# Patient Record
Sex: Male | Born: 1955 | Race: Black or African American | Hispanic: No | Marital: Married | Smoking: Former smoker
Health system: Southern US, Community
[De-identification: ages and names within clinical notes are randomized; demographics above are authoritative.]

## PROBLEM LIST (undated history)

## (undated) DIAGNOSIS — K439 Ventral hernia without obstruction or gangrene: Secondary | ICD-10-CM

## (undated) DIAGNOSIS — Z87442 Personal history of urinary calculi: Secondary | ICD-10-CM

## (undated) DIAGNOSIS — M1990 Unspecified osteoarthritis, unspecified site: Secondary | ICD-10-CM

## (undated) DIAGNOSIS — I10 Essential (primary) hypertension: Secondary | ICD-10-CM

## (undated) DIAGNOSIS — Z8701 Personal history of pneumonia (recurrent): Secondary | ICD-10-CM

## (undated) DIAGNOSIS — N529 Male erectile dysfunction, unspecified: Secondary | ICD-10-CM

## (undated) DIAGNOSIS — Z8679 Personal history of other diseases of the circulatory system: Secondary | ICD-10-CM

## (undated) DIAGNOSIS — I4892 Unspecified atrial flutter: Secondary | ICD-10-CM

## (undated) DIAGNOSIS — Z9289 Personal history of other medical treatment: Secondary | ICD-10-CM

## (undated) DIAGNOSIS — J189 Pneumonia, unspecified organism: Secondary | ICD-10-CM

## (undated) DIAGNOSIS — Z992 Dependence on renal dialysis: Secondary | ICD-10-CM

## (undated) DIAGNOSIS — E119 Type 2 diabetes mellitus without complications: Secondary | ICD-10-CM

## (undated) DIAGNOSIS — N2581 Secondary hyperparathyroidism of renal origin: Secondary | ICD-10-CM

## (undated) DIAGNOSIS — N186 End stage renal disease: Secondary | ICD-10-CM

## (undated) DIAGNOSIS — M109 Gout, unspecified: Secondary | ICD-10-CM

## (undated) DIAGNOSIS — G43909 Migraine, unspecified, not intractable, without status migrainosus: Secondary | ICD-10-CM

## (undated) DIAGNOSIS — I219 Acute myocardial infarction, unspecified: Secondary | ICD-10-CM

## (undated) DIAGNOSIS — Z973 Presence of spectacles and contact lenses: Secondary | ICD-10-CM

## (undated) DIAGNOSIS — I2510 Atherosclerotic heart disease of native coronary artery without angina pectoris: Secondary | ICD-10-CM

## (undated) HISTORY — PX: CARDIAC CATHETERIZATION: SHX172

## (undated) HISTORY — PX: COLONOSCOPY: SHX174

## (undated) HISTORY — DX: Gout, unspecified: M10.9

## (undated) HISTORY — PX: APPENDECTOMY: SHX54

## (undated) HISTORY — DX: Secondary hyperparathyroidism of renal origin: N25.81

---
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
## 1995-07-29 HISTORY — PX: CORONARY ARTERY BYPASS GRAFT: SHX141

## 2007-07-29 HISTORY — PX: KIDNEY TRANSPLANT: SHX239

## 2011-09-04 DIAGNOSIS — Z79899 Other long term (current) drug therapy: Secondary | ICD-10-CM | POA: Diagnosis not present

## 2011-09-04 DIAGNOSIS — Z940 Kidney transplant status: Secondary | ICD-10-CM | POA: Diagnosis not present

## 2011-10-02 DIAGNOSIS — Z79899 Other long term (current) drug therapy: Secondary | ICD-10-CM | POA: Diagnosis not present

## 2011-10-02 DIAGNOSIS — I10 Essential (primary) hypertension: Secondary | ICD-10-CM | POA: Diagnosis not present

## 2011-10-02 DIAGNOSIS — Z5189 Encounter for other specified aftercare: Secondary | ICD-10-CM | POA: Diagnosis not present

## 2011-10-02 DIAGNOSIS — Z5181 Encounter for therapeutic drug level monitoring: Secondary | ICD-10-CM | POA: Diagnosis not present

## 2011-11-11 DIAGNOSIS — Z5181 Encounter for therapeutic drug level monitoring: Secondary | ICD-10-CM | POA: Diagnosis not present

## 2011-11-11 DIAGNOSIS — Z79899 Other long term (current) drug therapy: Secondary | ICD-10-CM | POA: Diagnosis not present

## 2011-11-11 DIAGNOSIS — I10 Essential (primary) hypertension: Secondary | ICD-10-CM | POA: Diagnosis not present

## 2011-11-28 DIAGNOSIS — H2510 Age-related nuclear cataract, unspecified eye: Secondary | ICD-10-CM | POA: Diagnosis not present

## 2012-01-01 DIAGNOSIS — I10 Essential (primary) hypertension: Secondary | ICD-10-CM | POA: Diagnosis not present

## 2012-01-01 DIAGNOSIS — Z79899 Other long term (current) drug therapy: Secondary | ICD-10-CM | POA: Diagnosis not present

## 2012-01-01 DIAGNOSIS — Z5181 Encounter for therapeutic drug level monitoring: Secondary | ICD-10-CM | POA: Diagnosis not present

## 2012-02-17 DIAGNOSIS — I10 Essential (primary) hypertension: Secondary | ICD-10-CM | POA: Diagnosis not present

## 2012-02-17 DIAGNOSIS — Z5181 Encounter for therapeutic drug level monitoring: Secondary | ICD-10-CM | POA: Diagnosis not present

## 2012-02-17 DIAGNOSIS — Z79899 Other long term (current) drug therapy: Secondary | ICD-10-CM | POA: Diagnosis not present

## 2012-03-23 DIAGNOSIS — I10 Essential (primary) hypertension: Secondary | ICD-10-CM | POA: Diagnosis not present

## 2012-03-23 DIAGNOSIS — Z5181 Encounter for therapeutic drug level monitoring: Secondary | ICD-10-CM | POA: Diagnosis not present

## 2012-03-23 DIAGNOSIS — Z79899 Other long term (current) drug therapy: Secondary | ICD-10-CM | POA: Diagnosis not present

## 2012-05-27 DIAGNOSIS — I10 Essential (primary) hypertension: Secondary | ICD-10-CM | POA: Diagnosis not present

## 2012-05-27 DIAGNOSIS — Z5181 Encounter for therapeutic drug level monitoring: Secondary | ICD-10-CM | POA: Diagnosis not present

## 2012-05-27 DIAGNOSIS — Z79899 Other long term (current) drug therapy: Secondary | ICD-10-CM | POA: Diagnosis not present

## 2012-07-16 DIAGNOSIS — Z5181 Encounter for therapeutic drug level monitoring: Secondary | ICD-10-CM | POA: Diagnosis not present

## 2012-07-16 DIAGNOSIS — Z79899 Other long term (current) drug therapy: Secondary | ICD-10-CM | POA: Diagnosis not present

## 2012-07-16 DIAGNOSIS — I10 Essential (primary) hypertension: Secondary | ICD-10-CM | POA: Diagnosis not present

## 2012-07-27 DIAGNOSIS — Z23 Encounter for immunization: Secondary | ICD-10-CM | POA: Diagnosis not present

## 2012-08-11 DIAGNOSIS — Z5181 Encounter for therapeutic drug level monitoring: Secondary | ICD-10-CM | POA: Diagnosis not present

## 2012-08-11 DIAGNOSIS — Z79899 Other long term (current) drug therapy: Secondary | ICD-10-CM | POA: Diagnosis not present

## 2012-08-11 DIAGNOSIS — I10 Essential (primary) hypertension: Secondary | ICD-10-CM | POA: Diagnosis not present

## 2012-09-02 DIAGNOSIS — Z5181 Encounter for therapeutic drug level monitoring: Secondary | ICD-10-CM | POA: Diagnosis not present

## 2012-09-02 DIAGNOSIS — I10 Essential (primary) hypertension: Secondary | ICD-10-CM | POA: Diagnosis not present

## 2012-09-02 DIAGNOSIS — Z79899 Other long term (current) drug therapy: Secondary | ICD-10-CM | POA: Diagnosis not present

## 2012-10-07 DIAGNOSIS — I10 Essential (primary) hypertension: Secondary | ICD-10-CM | POA: Diagnosis not present

## 2012-10-07 DIAGNOSIS — Z940 Kidney transplant status: Secondary | ICD-10-CM | POA: Diagnosis not present

## 2012-10-07 DIAGNOSIS — N186 End stage renal disease: Secondary | ICD-10-CM | POA: Diagnosis not present

## 2012-10-21 DIAGNOSIS — T451X4A Poisoning by antineoplastic and immunosuppressive drugs, undetermined, initial encounter: Secondary | ICD-10-CM | POA: Diagnosis not present

## 2012-10-21 DIAGNOSIS — Z940 Kidney transplant status: Secondary | ICD-10-CM | POA: Diagnosis not present

## 2012-12-17 DIAGNOSIS — T451X4A Poisoning by antineoplastic and immunosuppressive drugs, undetermined, initial encounter: Secondary | ICD-10-CM | POA: Diagnosis not present

## 2012-12-17 DIAGNOSIS — Z940 Kidney transplant status: Secondary | ICD-10-CM | POA: Diagnosis not present

## 2013-01-27 DIAGNOSIS — H2510 Age-related nuclear cataract, unspecified eye: Secondary | ICD-10-CM | POA: Diagnosis not present

## 2013-01-27 DIAGNOSIS — H43819 Vitreous degeneration, unspecified eye: Secondary | ICD-10-CM | POA: Diagnosis not present

## 2013-01-31 DIAGNOSIS — Z940 Kidney transplant status: Secondary | ICD-10-CM | POA: Diagnosis not present

## 2013-01-31 DIAGNOSIS — R350 Frequency of micturition: Secondary | ICD-10-CM | POA: Diagnosis not present

## 2013-01-31 DIAGNOSIS — N19 Unspecified kidney failure: Secondary | ICD-10-CM | POA: Diagnosis not present

## 2013-01-31 DIAGNOSIS — E7800 Pure hypercholesterolemia, unspecified: Secondary | ICD-10-CM | POA: Diagnosis not present

## 2013-01-31 DIAGNOSIS — F17200 Nicotine dependence, unspecified, uncomplicated: Secondary | ICD-10-CM | POA: Diagnosis present

## 2013-01-31 DIAGNOSIS — E1010 Type 1 diabetes mellitus with ketoacidosis without coma: Secondary | ICD-10-CM | POA: Diagnosis not present

## 2013-01-31 DIAGNOSIS — I1310 Hypertensive heart and chronic kidney disease without heart failure, with stage 1 through stage 4 chronic kidney disease, or unspecified chronic kidney disease: Secondary | ICD-10-CM | POA: Diagnosis present

## 2013-01-31 DIAGNOSIS — E870 Hyperosmolality and hypernatremia: Secondary | ICD-10-CM | POA: Diagnosis not present

## 2013-01-31 DIAGNOSIS — R069 Unspecified abnormalities of breathing: Secondary | ICD-10-CM | POA: Diagnosis not present

## 2013-01-31 DIAGNOSIS — E1065 Type 1 diabetes mellitus with hyperglycemia: Secondary | ICD-10-CM | POA: Diagnosis not present

## 2013-01-31 DIAGNOSIS — E875 Hyperkalemia: Secondary | ICD-10-CM | POA: Diagnosis not present

## 2013-01-31 DIAGNOSIS — E1110 Type 2 diabetes mellitus with ketoacidosis without coma: Secondary | ICD-10-CM | POA: Diagnosis not present

## 2013-01-31 DIAGNOSIS — N184 Chronic kidney disease, stage 4 (severe): Secondary | ICD-10-CM | POA: Diagnosis present

## 2013-01-31 DIAGNOSIS — E860 Dehydration: Secondary | ICD-10-CM | POA: Diagnosis not present

## 2013-01-31 DIAGNOSIS — R5381 Other malaise: Secondary | ICD-10-CM | POA: Diagnosis not present

## 2013-01-31 DIAGNOSIS — R0602 Shortness of breath: Secondary | ICD-10-CM | POA: Diagnosis not present

## 2013-01-31 DIAGNOSIS — I2510 Atherosclerotic heart disease of native coronary artery without angina pectoris: Secondary | ICD-10-CM | POA: Diagnosis present

## 2013-01-31 DIAGNOSIS — Z7982 Long term (current) use of aspirin: Secondary | ICD-10-CM | POA: Diagnosis not present

## 2013-01-31 DIAGNOSIS — N185 Chronic kidney disease, stage 5: Secondary | ICD-10-CM | POA: Diagnosis not present

## 2013-02-08 DIAGNOSIS — Z940 Kidney transplant status: Secondary | ICD-10-CM | POA: Diagnosis not present

## 2013-02-08 DIAGNOSIS — I10 Essential (primary) hypertension: Secondary | ICD-10-CM | POA: Diagnosis not present

## 2013-02-08 DIAGNOSIS — E119 Type 2 diabetes mellitus without complications: Secondary | ICD-10-CM | POA: Diagnosis not present

## 2013-02-12 DIAGNOSIS — I10 Essential (primary) hypertension: Secondary | ICD-10-CM | POA: Diagnosis not present

## 2013-02-12 DIAGNOSIS — Z136 Encounter for screening for cardiovascular disorders: Secondary | ICD-10-CM | POA: Diagnosis not present

## 2013-02-12 DIAGNOSIS — E119 Type 2 diabetes mellitus without complications: Secondary | ICD-10-CM | POA: Diagnosis not present

## 2013-02-24 DIAGNOSIS — E559 Vitamin D deficiency, unspecified: Secondary | ICD-10-CM | POA: Diagnosis not present

## 2013-02-24 DIAGNOSIS — R69 Illness, unspecified: Secondary | ICD-10-CM | POA: Diagnosis not present

## 2013-02-24 DIAGNOSIS — E119 Type 2 diabetes mellitus without complications: Secondary | ICD-10-CM | POA: Diagnosis not present

## 2013-03-02 DIAGNOSIS — Z940 Kidney transplant status: Secondary | ICD-10-CM | POA: Diagnosis not present

## 2013-03-02 DIAGNOSIS — T451X4A Poisoning by antineoplastic and immunosuppressive drugs, undetermined, initial encounter: Secondary | ICD-10-CM | POA: Diagnosis not present

## 2013-03-07 DIAGNOSIS — E119 Type 2 diabetes mellitus without complications: Secondary | ICD-10-CM | POA: Diagnosis not present

## 2013-03-07 DIAGNOSIS — H524 Presbyopia: Secondary | ICD-10-CM | POA: Diagnosis not present

## 2013-03-07 DIAGNOSIS — H5210 Myopia, unspecified eye: Secondary | ICD-10-CM | POA: Diagnosis not present

## 2013-03-07 DIAGNOSIS — E109 Type 1 diabetes mellitus without complications: Secondary | ICD-10-CM | POA: Diagnosis not present

## 2013-03-08 ENCOUNTER — Encounter: Payer: Medicare Other | Attending: Emergency Medicine | Admitting: *Deleted

## 2013-03-08 VITALS — Ht 67.0 in | Wt 225.3 lb

## 2013-03-08 DIAGNOSIS — Z713 Dietary counseling and surveillance: Secondary | ICD-10-CM | POA: Diagnosis not present

## 2013-03-08 DIAGNOSIS — E119 Type 2 diabetes mellitus without complications: Secondary | ICD-10-CM | POA: Diagnosis not present

## 2013-03-12 DIAGNOSIS — J019 Acute sinusitis, unspecified: Secondary | ICD-10-CM | POA: Diagnosis not present

## 2013-03-12 DIAGNOSIS — E119 Type 2 diabetes mellitus without complications: Secondary | ICD-10-CM | POA: Diagnosis not present

## 2013-03-16 ENCOUNTER — Encounter: Payer: Self-pay | Admitting: *Deleted

## 2013-03-16 NOTE — Patient Instructions (Signed)
Goals:  Follow Diabetes Meal Plan as instructed  Eat 3 meals and 2 snacks, every 3-5 hrs  Limit carbohydrate intake to 45-60 grams carbohydrate/meal  Limit carbohydrate intake to 0-30 grams carbohydrate/snack  Add lean protein foods to meals/snacks  Monitor glucose levels as instructed by your doctor  Aim for 15-30 mins of physical activity daily  Bring food record and glucose log to your next nutrition visit

## 2013-03-16 NOTE — Progress Notes (Signed)
Patient was seen on 03/08/13 for the first of a series of three diabetes self-management courses at the [facility].   Current HbA1c: not provided by MD  The following learning objectives were met by the patient during this course:   Defines the role of glucose and insulin  Identifies type of diabetes and pathophysiology  Defines the diagnostic criteria for diabetes and prediabetes  States the risk factors for Type 2 Diabetes  States the symptoms of Type 2 Diabetes  Defines Type 2 Diabetes treatment goals  Defines Type 2 Diabetes treatment options  States the rationale for glucose monitoring  Identifies A1C, glucose targets, and testing times  Identifies proper sharps disposal  Defines the purpose of a diabetes food plan  Identifies carbohydrate food groups  Defines effects of carbohydrate foods on glucose levels  Identifies carbohydrate choices/grams/food labels  States benefits of physical activity and effect on glucose  Review of suggested activity guidelines  Handouts given during class include:  Type 2 Diabetes: Basics Book  My [place] and Activity Log  Your patient has identified their diabetes self-care support plan as:  [place] support group  Continued diabetes education  Follow-Up Plan: Attend core 2 and core 3

## 2013-03-18 ENCOUNTER — Ambulatory Visit (INDEPENDENT_AMBULATORY_CARE_PROVIDER_SITE_OTHER): Payer: Medicare Other | Admitting: Endocrinology

## 2013-03-18 VITALS — BP 132/80 | HR 73 | Ht 67.0 in | Wt 227.0 lb

## 2013-03-18 DIAGNOSIS — N529 Male erectile dysfunction, unspecified: Secondary | ICD-10-CM | POA: Diagnosis not present

## 2013-03-18 DIAGNOSIS — M109 Gout, unspecified: Secondary | ICD-10-CM

## 2013-03-18 DIAGNOSIS — E109 Type 1 diabetes mellitus without complications: Secondary | ICD-10-CM

## 2013-03-18 DIAGNOSIS — N186 End stage renal disease: Secondary | ICD-10-CM

## 2013-03-18 DIAGNOSIS — I10 Essential (primary) hypertension: Secondary | ICD-10-CM

## 2013-03-18 DIAGNOSIS — N2581 Secondary hyperparathyroidism of renal origin: Secondary | ICD-10-CM

## 2013-03-18 DIAGNOSIS — Z940 Kidney transplant status: Secondary | ICD-10-CM

## 2013-03-18 NOTE — Progress Notes (Signed)
Subjective:    Patient ID: Patrick [name], male    DOB: June 19, 1956, 57 y.o.   MRN: [id number]  HPI He had an episode of severe hyperglycemia last month, and was then dx'ed with DM.  He denies numbness of the feet, or assoc pain.  He says his diet and activity are good.  he brings a record of his cbg's which i have reviewed today.  He is having hypoglycemia in the early am.  During the day, it goes as high as 184.  Prednisone dosage has not recently changed.   Past Medical History  Diagnosis Date  . Hypertension   . Diabetes mellitus without complication     Past Surgical History  Procedure Laterality Date  . Birth defect heart surgery  1996  . Kidney transplant  2009    History   Social History  . Marital Status: Married    Spouse Name: N/A    Number of Children: N/A  . Years of Education: N/A   Occupational History  . Not on file.   Social History Main Topics  . Smoking status: Not on file  . Smokeless tobacco: Not on file  . Alcohol Use: Not on file  . Drug Use: Not on file  . Sexual Activity: Not on file   Other Topics Concern  . Not on file   Social History Narrative  . No narrative on file    Current Outpatient Prescriptions on File Prior to Visit  Medication Sig Dispense Refill  . allopurinol (ZYLOPRIM) 100 MG tablet Take 100 mg by mouth daily.      Marland Kitchen amLODipine (NORVASC) 10 MG tablet Take 10 mg by mouth daily.      Marland Kitchen amLODipine (NORVASC) 2.5 MG tablet Take 2.5 mg by mouth daily.      . calcitRIOL (ROCALTROL) 0.5 MCG capsule Take 0.5 mcg by mouth daily.      . carvedilol (COREG) 25 MG tablet Take 25 mg by mouth 2 (two) times daily with a meal.      . Insulin Detemir (LEVEMIR FLEXPEN [place]) Inject 58 Units into the skin at bedtime.      . Insulin Lispro, Human, (HUMALOG PEN [place]) Inject into the skin 3 (three) times daily before meals.      . predniSONE (DELTASONE) 5 MG tablet Take 5 mg by mouth daily.      . tadalafil (CIALIS) 5 MG tablet Take 5 mg by mouth daily  as needed for erectile dysfunction.       No current facility-administered medications on file prior to visit.    No Known Allergies  No family history on file. DM: none BP 132/80  Pulse 73  Ht 5\' 7"  (1.702 m)  Wt 227 lb (102.967 kg)  BMI 35.55 kg/m2  SpO2 98%  Review of Systems denies blurry vision, headache, chest pain, sob, n/v, urinary frequency, cramps, excessive diaphoresis, memory loss, depression, rhinorrhea, and easy bruising.  He has lost weight.      Objective:   Physical Exam VS: see vs page GEN: no distress HEAD: head: no deformity eyes: no periorbital swelling, no proptosis external nose and ears are normal mouth: no lesion seen NECK: supple, thyroid is not enlarged CHEST WALL: no deformity LUNGS:  Clear to auscultation CV: reg rate and rhythm, no murmur ABD: abdomen is soft, nontender.  no hepatosplenomegaly.  not distended.  no hernia.  Large RLQ incisional hernia.  Old healed surgical scars are noted.   MUSCULOSKELETAL: muscle bulk and strength are  grossly normal.  no obvious joint swelling.  gait is normal and steady PULSES: no carotid bruit NEURO:  cn 2-12 grossly intact.   readily moves all 4's.   SKIN:  Normal texture and temperature.  No rash or suspicious lesion is visible.   NODES:  None palpable at the neck PSYCH: alert, oriented x3.  Does not appear anxious nor depressed.   outside test results are reviewed: A1c=13%    Assessment & Plan:  DM: this a1c causes very high risk to his health.  The pattern of his cbg's indicates he needs some adjustment in his therapy. ESRD with transplant.  The prednisone would not contribute significantly to his hyperglycemia. Weight loss.  This helps the control of his DM.

## 2013-03-18 NOTE — Patient Instructions (Addendum)
good diet and exercise habits significanly improve the control of your diabetes.  please let me know if you wish to be referred to a dietician.  high blood sugar is very risky to your health.  you should see an eye doctor every year.  You are at higher than average risk for pneumonia and hepatitis-B.  You should be vaccinated against both.   controlling your blood pressure and cholesterol drastically reduces the damage diabetes does to your body.  this also applies to quitting smoking.  please discuss these with your doctor.  check your blood sugar twice a day.  vary the time of day when you check, between before the 3 meals, and at bedtime.  also check if you have symptoms of your blood sugar being too high or too low.  please keep a record of the readings and bring it to your next appointment here.  please call us sooner if your blood sugar goes below 70, or if you have a lot of readings over 200.   Please reduce the levemir to 30 units at bedtime, and: Take humalog 15 units 3 times a day (just before each meal), no matter what your blood sugar is.   Please come back for a follow-up appointment in 2 weeks.

## 2013-03-19 DIAGNOSIS — I10 Essential (primary) hypertension: Secondary | ICD-10-CM | POA: Insufficient documentation

## 2013-03-19 DIAGNOSIS — N529 Male erectile dysfunction, unspecified: Secondary | ICD-10-CM | POA: Insufficient documentation

## 2013-03-19 DIAGNOSIS — M109 Gout, unspecified: Secondary | ICD-10-CM | POA: Insufficient documentation

## 2013-03-19 DIAGNOSIS — N2581 Secondary hyperparathyroidism of renal origin: Secondary | ICD-10-CM | POA: Insufficient documentation

## 2013-03-19 DIAGNOSIS — N186 End stage renal disease: Secondary | ICD-10-CM | POA: Insufficient documentation

## 2013-03-29 ENCOUNTER — Ambulatory Visit: Payer: Self-pay | Admitting: Internal Medicine

## 2013-03-29 ENCOUNTER — Ambulatory Visit: Payer: Self-pay

## 2013-04-01 ENCOUNTER — Ambulatory Visit (INDEPENDENT_AMBULATORY_CARE_PROVIDER_SITE_OTHER): Payer: Medicare Other | Admitting: Endocrinology

## 2013-04-01 VITALS — BP 134/74 | Ht 65.0 in | Wt 228.0 lb

## 2013-04-01 DIAGNOSIS — E109 Type 1 diabetes mellitus without complications: Secondary | ICD-10-CM

## 2013-04-01 NOTE — Patient Instructions (Addendum)
check your blood sugar twice a day.  vary the time of day when you check, between before the 3 meals, and at bedtime.  also check if you have symptoms of your blood sugar being too high or too low.  please keep a record of the readings and bring it to your next appointment here.  please call us sooner if your blood sugar goes below 70, or if you have a lot of readings over 200.   Please continue the same levemir, and:   Change the humalog to 3 times a day (just before each meal) 05-11-14 units.   Please come back for a follow-up appointment in 1 month.    In my opinion, you can resume driving.

## 2013-04-01 NOTE — Progress Notes (Signed)
  Subjective:    Patient ID: Patrick [name], male    DOB: 03-12-1956, 57 y.o.   MRN: [id number]  HPI He had an episode of severe hyperglycemia 2 months ago, and was then dx'ed with DM.  He denies numbness of the feet, or assoc pain.  he brings a record of his cbg's which i have reviewed today, checked qac.  It varies from 60 (before lunch) to low-100's (other times of day).  He does not check at hs. pt states he feels well in general.   Past Medical History  Diagnosis Date  . Hypertension   . Diabetes mellitus without complication     Past Surgical History  Procedure Laterality Date  . Birth defect heart surgery  1996  . Kidney transplant  2009    History   Social History  . Marital Status: Married    Spouse Name: N/A    Number of Children: N/A  . Years of Education: N/A   Occupational History  . Not on file.   Social History Main Topics  . Smoking status: Not on file  . Smokeless tobacco: Not on file  . Alcohol Use: Not on file  . Drug Use: Not on file  . Sexual Activity: Not on file   Other Topics Concern  . Not on file   Social History Narrative  . No narrative on file    Current Outpatient Prescriptions on File Prior to Visit  Medication Sig Dispense Refill  . allopurinol (ZYLOPRIM) 100 MG tablet Take 100 mg by mouth daily.      Marland Kitchen amLODipine (NORVASC) 10 MG tablet Take 10 mg by mouth daily.      Marland Kitchen amLODipine (NORVASC) 2.5 MG tablet Take 2.5 mg by mouth daily.      . calcitRIOL (ROCALTROL) 0.5 MCG capsule Take 0.5 mcg by mouth daily.      . carvedilol (COREG) 25 MG tablet Take 25 mg by mouth 2 (two) times daily with a meal.      . Insulin Detemir (LEVEMIR FLEXPEN [place]) Inject 30 Units into the skin at bedtime.       . Insulin Lispro, Human, (HUMALOG PEN [place]) 3 times a day (just before each meal) 05-11-14 units      . predniSONE (DELTASONE) 5 MG tablet Take 5 mg by mouth daily.      . tadalafil (CIALIS) 5 MG tablet Take 5 mg by mouth daily as needed for erectile  dysfunction.       No current facility-administered medications on file prior to visit.   No Known Allergies  No family history on file.  BP 134/74  Ht 5\' 5"  (1.651 m)  Wt 228 lb (103.42 kg)  BMI 37.94 kg/m2  SpO2 97%  Review of Systems Denies LOC and weight change.      Objective:   Physical Exam VITAL SIGNS:  See vs page GENERAL: no distress SKIN:  Insulin injection sites at the anterior abdomen are normal.       Assessment & Plan:  DM: This insulin regimen was chosen from multiple options, as it best matches his insulin to his changing requirements throughout the day.  The benefits of glycemic control must be weighed against the risks of hypoglycemia.   Renal failure: this sometimes removes the need for basal insulin, but she has had a transplant.

## 2013-04-12 ENCOUNTER — Ambulatory Visit: Payer: Self-pay

## 2013-04-14 DIAGNOSIS — Z940 Kidney transplant status: Secondary | ICD-10-CM | POA: Diagnosis not present

## 2013-04-14 DIAGNOSIS — T451X4A Poisoning by antineoplastic and immunosuppressive drugs, undetermined, initial encounter: Secondary | ICD-10-CM | POA: Diagnosis not present

## 2013-05-06 ENCOUNTER — Ambulatory Visit (INDEPENDENT_AMBULATORY_CARE_PROVIDER_SITE_OTHER): Payer: Medicare Other | Admitting: Endocrinology

## 2013-05-06 VITALS — BP 126/80 | HR 80 | Wt 227.0 lb

## 2013-05-06 DIAGNOSIS — E109 Type 1 diabetes mellitus without complications: Secondary | ICD-10-CM

## 2013-05-06 NOTE — Progress Notes (Signed)
  Subjective:    Patient ID: Patrick [name], male    DOB: 05/01/1956, 56 y.o.   MRN: [id number]  HPI Pt returns for f/u of insulin-requiring DM (dx'ed mid-2014, when he presented with severe hyperglycemia; he has mild neuropathy of the lower extremities; no known associated chronic complications).  He has numbness of the feet.  no cbg record, but states cbg's vary from 98-170.  It is in general higher as the day goes on, but he does not check at hs.   Past Medical History  Diagnosis Date  . Hypertension   . Diabetes mellitus without complication     Past Surgical History  Procedure Laterality Date  . Birth defect heart surgery  1996  . Kidney transplant  2009    History   Social History  . Marital Status: Married    Spouse Name: N/A    Number of Children: N/A  . Years of Education: N/A   Occupational History  . Not on file.   Social History Main Topics  . Smoking status: Not on file  . Smokeless tobacco: Not on file  . Alcohol Use: Not on file  . Drug Use: Not on file  . Sexual Activity: Not on file   Other Topics Concern  . Not on file   Social History Narrative  . No narrative on file    Current Outpatient Prescriptions on File Prior to Visit  Medication Sig Dispense Refill  . allopurinol (ZYLOPRIM) 100 MG tablet Take 100 mg by mouth daily.      Marland Kitchen amLODipine (NORVASC) 10 MG tablet Take 10 mg by mouth daily.      Marland Kitchen amLODipine (NORVASC) 2.5 MG tablet Take 2.5 mg by mouth daily.      . calcitRIOL (ROCALTROL) 0.5 MCG capsule Take 0.5 mcg by mouth daily.      . carvedilol (COREG) 25 MG tablet Take 25 mg by mouth 2 (two) times daily with a meal.      . Insulin Detemir (LEVEMIR FLEXPEN [place]) Inject 20 Units into the skin at bedtime.       . Insulin Lispro, Human, (HUMALOG PEN [place]) 3 times a day (just before each meal) 05-17-19 units      . predniSONE (DELTASONE) 5 MG tablet Take 5 mg by mouth daily.      . tadalafil (CIALIS) 5 MG tablet Take 5 mg by mouth daily as needed for  erectile dysfunction.       No current facility-administered medications on file prior to visit.    No Known Allergies  No family history on file.  BP 126/80  Pulse 80  Wt 227 lb (102.967 kg)  BMI 37.77 kg/m2  SpO2 95%  Review of Systems denies hypoglycemia and weight change.      Objective:   Physical Exam VITAL SIGNS:  See vs page GENERAL: no distress.    Assessment & Plan:  DM: This insulin regimen was chosen from multiple options, as it best matches his insulin to his changing requirements throughout the day.  The benefits of glycemic control must be weighed against the risks of hypoglycemia.  Renal failure: prednisone can worsen glycemic control.   Numbness, usually due to DM. We can check b-12 if persists.

## 2013-05-06 NOTE — Patient Instructions (Addendum)
check your blood sugar twice a day.  vary the time of day when you check, between before the 3 meals, and at bedtime.  also check if you have symptoms of your blood sugar being too high or too low.  please keep a record of the readings and bring it to your next appointment here.  please call us sooner if your blood sugar goes below 70, or if you have a lot of readings over 200.   Please reduce the levemir to 20 units at bedtime, and:   Change the humalog to 3 times a day (just before each meal) 05-17-19 units.   Please come back for a follow-up appointment in 2 months.

## 2013-05-10 ENCOUNTER — Encounter: Payer: Medicare Other | Attending: Emergency Medicine

## 2013-05-10 DIAGNOSIS — E119 Type 2 diabetes mellitus without complications: Secondary | ICD-10-CM | POA: Insufficient documentation

## 2013-05-10 DIAGNOSIS — Z713 Dietary counseling and surveillance: Secondary | ICD-10-CM | POA: Insufficient documentation

## 2013-05-25 DIAGNOSIS — Z713 Dietary counseling and surveillance: Secondary | ICD-10-CM | POA: Diagnosis not present

## 2013-05-25 DIAGNOSIS — E119 Type 2 diabetes mellitus without complications: Secondary | ICD-10-CM | POA: Diagnosis not present

## 2013-05-25 NOTE — Progress Notes (Signed)
Patient was seen on 05/25/13 for the third of a series of three diabetes self-management courses at the [facility]. The following learning objectives were met by the patient during this class:    State the amount of activity recommended for healthy living   Describe activities suitable for individual needs   Identify ways to regularly incorporate activity into daily life   Identify barriers to activity and ways to over come these barriers  Identify diabetes medications being personally used and their primary action for lowering glucose and possible side effects   Describe role of stress on blood glucose and develop strategies to address psychosocial issues   Identify diabetes complications and ways to prevent them  Explain how to manage diabetes during illness   Evaluate success in meeting personal goal   Establish 2-3 goals that they will plan to diligently work on until they return for the free 32-month follow-up visit  Your patient has established the following 4 month goals in their individualized success plan:  none  Your patient has identified these potential barriers to change:  None  Your patient has identified their diabetes self-care support plan as  None

## 2013-05-26 ENCOUNTER — Telehealth: Payer: Self-pay | Admitting: Endocrinology

## 2013-05-26 ENCOUNTER — Other Ambulatory Visit: Payer: Self-pay | Admitting: Endocrinology

## 2013-05-26 NOTE — Telephone Encounter (Signed)
Next option is humulin NPH kwikpen. If that is too expensive, too, the next option is to take from the bottle.

## 2013-05-26 NOTE — Telephone Encounter (Signed)
Patient called and states that the drug store said they are not doing the flex pen for levemir any longer and it is going to cost 145.00 and he wants to know if there is anything else he can take that will cost less. Please advise.

## 2013-05-26 NOTE — Telephone Encounter (Signed)
They are changing to "flextouch."  Please inquire about the cost of this.  If it too is very expensive, please call back

## 2013-05-26 NOTE — Telephone Encounter (Signed)
Called pt and advised him that the next option was Humulin NPH kwikpen. He stated that he would just stay on the Levemir. Told him ok.

## 2013-05-26 NOTE — Telephone Encounter (Signed)
Called pt and advised him they changed it to [place]. Pt said that's what they told him. That is too expensive and he would like something less expensive. Please advise.

## 2013-05-30 ENCOUNTER — Other Ambulatory Visit: Payer: Self-pay | Admitting: Endocrinology

## 2013-05-30 DIAGNOSIS — Z940 Kidney transplant status: Secondary | ICD-10-CM | POA: Diagnosis not present

## 2013-05-30 DIAGNOSIS — T451X4A Poisoning by antineoplastic and immunosuppressive drugs, undetermined, initial encounter: Secondary | ICD-10-CM | POA: Diagnosis not present

## 2013-06-30 DIAGNOSIS — Z940 Kidney transplant status: Secondary | ICD-10-CM | POA: Diagnosis not present

## 2013-06-30 DIAGNOSIS — T451X4A Poisoning by antineoplastic and immunosuppressive drugs, undetermined, initial encounter: Secondary | ICD-10-CM | POA: Diagnosis not present

## 2013-07-07 DIAGNOSIS — N529 Male erectile dysfunction, unspecified: Secondary | ICD-10-CM | POA: Diagnosis not present

## 2013-07-07 DIAGNOSIS — Z940 Kidney transplant status: Secondary | ICD-10-CM | POA: Diagnosis not present

## 2013-07-07 DIAGNOSIS — I10 Essential (primary) hypertension: Secondary | ICD-10-CM | POA: Diagnosis not present

## 2013-07-07 DIAGNOSIS — D849 Immunodeficiency, unspecified: Secondary | ICD-10-CM | POA: Insufficient documentation

## 2013-07-07 DIAGNOSIS — D899 Disorder involving the immune mechanism, unspecified: Secondary | ICD-10-CM | POA: Diagnosis not present

## 2013-07-25 ENCOUNTER — Other Ambulatory Visit: Payer: Self-pay

## 2013-07-25 MED ORDER — INSULIN LISPRO 100 UNIT/ML (KWIKPEN): PEN_INJECTOR | SUBCUTANEOUS | Status: DC

## 2013-08-02 ENCOUNTER — Ambulatory Visit (INDEPENDENT_AMBULATORY_CARE_PROVIDER_SITE_OTHER): Payer: Medicare Other | Admitting: Endocrinology

## 2013-08-02 ENCOUNTER — Encounter: Payer: Self-pay | Admitting: Endocrinology

## 2013-08-02 VITALS — BP 126/60 | HR 72 | Temp 98.0°F | Ht 67.0 in | Wt 234.4 lb

## 2013-08-02 DIAGNOSIS — E109 Type 1 diabetes mellitus without complications: Secondary | ICD-10-CM

## 2013-08-02 LAB — HEMOGLOBIN A1C: Hgb A1c MFr Bld: 6.3 % (ref 4.6–6.5)

## 2013-08-02 MED ORDER — INSULIN NPH (HUMAN) (ISOPHANE) 100 UNIT/ML ~~LOC~~ SUSP: 15.0000 [IU] | Freq: Every day | SUBCUTANEOUS | Status: DC

## 2013-08-02 MED ORDER — INSULIN REGULAR HUMAN 100 UNIT/ML IJ SOLN: INTRAMUSCULAR | Status: DC

## 2013-08-02 NOTE — Patient Instructions (Addendum)
check your blood sugar twice a day.  vary the time of day when you check, between before the 3 meals, and at bedtime.  also check if you have symptoms of your blood sugar being too high or too low.  please keep a record of the readings and bring it to your next appointment here.  please call us sooner if your blood sugar goes below 70, or if you have a lot of readings over 200.   Please change the levemir NPH, 15 units at bedtime, and:   Change the humalog to regular, 3 times a day (just before each meal) 05-17-19 units.   Please come back for a follow-up appointment in 3 months.    blood tests are being requested for you today.  We'll contact you with results.

## 2013-08-02 NOTE — Progress Notes (Signed)
   Subjective:    Patient ID: Patrick [name], male    DOB: [birth year], 58 y.o.   MRN: [id number]  HPI Pt returns for f/u of insulin-requiring DM (dx'ed mid-2014, when he presented with severe hyperglycemia; he has mild neuropathy of the lower extremities, and associated renal failure (transplant)).   pt says he ran out of his insulin last week, as he could not afford it.  When he was on the insulin, cbg's were well-controlled.   Past Medical History  Diagnosis Date  . Hypertension   . Diabetes mellitus without complication     Past Surgical History  Procedure Laterality Date  . Birth defect heart surgery  1996  . Kidney transplant  2009    History   Social History  . Marital Status: Married    Spouse Name: N/A    Number of Children: N/A  . Years of Education: N/A   Occupational History  . Not on file.   Social History Main Topics  . Smoking status: Not on file  . Smokeless tobacco: Not on file  . Alcohol Use: No  . Drug Use: Not on file  . Sexual Activity: Not on file   Other Topics Concern  . Not on file   Social History Narrative  . No narrative on file    Current Outpatient Prescriptions on File Prior to Visit  Medication Sig Dispense Refill  . allopurinol (ZYLOPRIM) 100 MG tablet Take 100 mg by mouth daily.      Marland Kitchen amLODipine (NORVASC) 10 MG tablet Take 10 mg by mouth daily.      Marland Kitchen amLODipine (NORVASC) 2.5 MG tablet Take 2.5 mg by mouth daily.      . calcitRIOL (ROCALTROL) 0.5 MCG capsule Take 0.5 mcg by mouth daily.      . carvedilol (COREG) 25 MG tablet Take 25 mg by mouth 2 (two) times daily with a meal.      . predniSONE (DELTASONE) 5 MG tablet Take 5 mg by mouth daily.      . tadalafil (CIALIS) 5 MG tablet Take 5 mg by mouth daily as needed for erectile dysfunction.       No current facility-administered medications on file prior to visit.   No Known Allergies  No family history on file.  BP 126/60  Pulse 72  Temp(Src) 98 F (36.7 C) (Oral)  Ht 5'  7" (1.702 m)  Wt 234 lb 6 oz (106.312 kg)  BMI 36.70 kg/m2  SpO2 96%  Review of Systems denies hypoglycemia and weight change.      Objective:   Physical Exam VITAL SIGNS:  See vs page GENERAL: no distress  Lab Results  Component Value Date   HGBA1C 6.3 08/02/2013      Assessment & Plan:  DM: This insulin regimen was chosen from multiple options, as it best matches his insulin to his changing requirements throughout the day.  The benefits of glycemic control must be weighed against the risks of hypoglycemia.  Economic circumstances: these limit the rx of DM.

## 2013-08-09 ENCOUNTER — Other Ambulatory Visit: Payer: Self-pay | Admitting: Endocrinology

## 2013-08-12 ENCOUNTER — Ambulatory Visit: Payer: Medicare Other | Admitting: Endocrinology

## 2013-08-12 DIAGNOSIS — Z0289 Encounter for other administrative examinations: Secondary | ICD-10-CM

## 2013-08-17 DIAGNOSIS — Z940 Kidney transplant status: Secondary | ICD-10-CM | POA: Diagnosis not present

## 2013-08-17 DIAGNOSIS — T451X4A Poisoning by antineoplastic and immunosuppressive drugs, undetermined, initial encounter: Secondary | ICD-10-CM | POA: Diagnosis not present

## 2013-09-05 ENCOUNTER — Telehealth: Payer: Self-pay

## 2013-09-05 MED ORDER — GLUCOSE BLOOD VI STRP: ORAL_STRIP | Status: DC

## 2013-09-05 NOTE — Telephone Encounter (Signed)
Please decrease lunch insulin to 10 units

## 2013-09-05 NOTE — Telephone Encounter (Signed)
Pt called stating that between lunch and supper his sugars are dropping into the 70's. Pt is currently taking 5 units with breakfast, 15 units with lunch, and 15 units with supper. Also, pt reports feelings of nausea and fulness.  Please advise, Thanks!

## 2013-09-06 MED ORDER — INSULIN LISPRO 100 UNIT/ML (KWIKPEN): PEN_INJECTOR | SUBCUTANEOUS | Status: DC

## 2013-09-06 MED ORDER — INSULIN DETEMIR 100 UNIT/ML FLEXPEN: 20.0000 [IU] | PEN_INJECTOR | Freq: Every day | SUBCUTANEOUS | Status: DC

## 2013-09-06 NOTE — Telephone Encounter (Signed)
We went with the N and R insulin, because they are cheaper.  Are you now able to afford the more expensive ones?

## 2013-09-06 NOTE — Telephone Encounter (Signed)
Pt states he is out of the "doughnut hole" States that it will cost him 35$ for each insulin. Thanks!

## 2013-09-06 NOTE — Telephone Encounter (Signed)
Pt informed. Pt requesting to changed from Humulin N & R to Humalog and Levemir.  Please advise, Thanks!

## 2013-09-06 NOTE — Telephone Encounter (Signed)
Ok, i have sent prescriptions to your pharmacy

## 2013-09-07 NOTE — Telephone Encounter (Signed)
Pt informed

## 2013-09-09 DIAGNOSIS — J018 Other acute sinusitis: Secondary | ICD-10-CM | POA: Diagnosis not present

## 2013-09-26 DIAGNOSIS — E162 Hypoglycemia, unspecified: Secondary | ICD-10-CM | POA: Diagnosis not present

## 2013-09-26 DIAGNOSIS — E161 Other hypoglycemia: Secondary | ICD-10-CM | POA: Diagnosis not present

## 2013-09-26 DIAGNOSIS — E119 Type 2 diabetes mellitus without complications: Secondary | ICD-10-CM | POA: Diagnosis not present

## 2013-09-26 DIAGNOSIS — R112 Nausea with vomiting, unspecified: Secondary | ICD-10-CM | POA: Diagnosis not present

## 2013-09-26 DIAGNOSIS — Z794 Long term (current) use of insulin: Secondary | ICD-10-CM | POA: Diagnosis not present

## 2013-09-27 ENCOUNTER — Ambulatory Visit: Payer: Medicare Other | Admitting: *Deleted

## 2013-09-28 ENCOUNTER — Telehealth: Payer: Self-pay | Admitting: Endocrinology

## 2013-09-28 ENCOUNTER — Emergency Department (HOSPITAL_COMMUNITY)
Admission: EM | Admit: 2013-09-28 | Discharge: 2013-09-28 | Payer: Medicare Other | Attending: Emergency Medicine | Admitting: Emergency Medicine

## 2013-09-28 ENCOUNTER — Encounter (HOSPITAL_COMMUNITY): Payer: Self-pay | Admitting: Emergency Medicine

## 2013-09-28 DIAGNOSIS — R6889 Other general symptoms and signs: Secondary | ICD-10-CM | POA: Diagnosis not present

## 2013-09-28 DIAGNOSIS — E162 Hypoglycemia, unspecified: Secondary | ICD-10-CM

## 2013-09-28 DIAGNOSIS — T8610 Unspecified complication of kidney transplant: Secondary | ICD-10-CM | POA: Diagnosis not present

## 2013-09-28 DIAGNOSIS — Z794 Long term (current) use of insulin: Secondary | ICD-10-CM | POA: Insufficient documentation

## 2013-09-28 DIAGNOSIS — R9439 Abnormal result of other cardiovascular function study: Secondary | ICD-10-CM | POA: Diagnosis not present

## 2013-09-28 DIAGNOSIS — Z79899 Other long term (current) drug therapy: Secondary | ICD-10-CM | POA: Insufficient documentation

## 2013-09-28 DIAGNOSIS — Q248 Other specified congenital malformations of heart: Secondary | ICD-10-CM | POA: Diagnosis not present

## 2013-09-28 DIAGNOSIS — K3189 Other diseases of stomach and duodenum: Secondary | ICD-10-CM | POA: Insufficient documentation

## 2013-09-28 DIAGNOSIS — R1110 Vomiting, unspecified: Secondary | ICD-10-CM

## 2013-09-28 DIAGNOSIS — M109 Gout, unspecified: Secondary | ICD-10-CM | POA: Diagnosis present

## 2013-09-28 DIAGNOSIS — F17200 Nicotine dependence, unspecified, uncomplicated: Secondary | ICD-10-CM | POA: Diagnosis not present

## 2013-09-28 DIAGNOSIS — N179 Acute kidney failure, unspecified: Secondary | ICD-10-CM | POA: Diagnosis not present

## 2013-09-28 DIAGNOSIS — I25810 Atherosclerosis of coronary artery bypass graft(s) without angina pectoris: Secondary | ICD-10-CM | POA: Diagnosis not present

## 2013-09-28 DIAGNOSIS — N186 End stage renal disease: Secondary | ICD-10-CM | POA: Diagnosis not present

## 2013-09-28 DIAGNOSIS — N2889 Other specified disorders of kidney and ureter: Secondary | ICD-10-CM | POA: Diagnosis not present

## 2013-09-28 DIAGNOSIS — Z7982 Long term (current) use of aspirin: Secondary | ICD-10-CM | POA: Diagnosis not present

## 2013-09-28 DIAGNOSIS — Z48298 Encounter for aftercare following other organ transplant: Secondary | ICD-10-CM | POA: Diagnosis not present

## 2013-09-28 DIAGNOSIS — R079 Chest pain, unspecified: Secondary | ICD-10-CM | POA: Diagnosis not present

## 2013-09-28 DIAGNOSIS — Z951 Presence of aortocoronary bypass graft: Secondary | ICD-10-CM | POA: Diagnosis not present

## 2013-09-28 DIAGNOSIS — R110 Nausea: Secondary | ICD-10-CM | POA: Diagnosis not present

## 2013-09-28 DIAGNOSIS — IMO0002 Reserved for concepts with insufficient information to code with codable children: Secondary | ICD-10-CM | POA: Diagnosis not present

## 2013-09-28 DIAGNOSIS — R112 Nausea with vomiting, unspecified: Secondary | ICD-10-CM | POA: Diagnosis not present

## 2013-09-28 DIAGNOSIS — E1169 Type 2 diabetes mellitus with other specified complication: Secondary | ICD-10-CM | POA: Insufficient documentation

## 2013-09-28 DIAGNOSIS — I10 Essential (primary) hypertension: Secondary | ICD-10-CM | POA: Diagnosis not present

## 2013-09-28 DIAGNOSIS — R1013 Epigastric pain: Secondary | ICD-10-CM

## 2013-09-28 DIAGNOSIS — E161 Other hypoglycemia: Secondary | ICD-10-CM | POA: Diagnosis not present

## 2013-09-28 DIAGNOSIS — Z940 Kidney transplant status: Secondary | ICD-10-CM | POA: Insufficient documentation

## 2013-09-28 DIAGNOSIS — N170 Acute kidney failure with tubular necrosis: Secondary | ICD-10-CM | POA: Diagnosis present

## 2013-09-28 DIAGNOSIS — I2510 Atherosclerotic heart disease of native coronary artery without angina pectoris: Secondary | ICD-10-CM | POA: Diagnosis present

## 2013-09-28 LAB — URINE MICROSCOPIC-ADD ON

## 2013-09-28 LAB — I-STAT CG4 LACTIC ACID, ED: LACTIC ACID, VENOUS: 0.71 mmol/L (ref 0.5–2.2)

## 2013-09-28 LAB — CBC WITH DIFFERENTIAL/PLATELET
BASOS PCT: 1 % (ref 0–1)
Basophils Absolute: 0.1 10*3/uL (ref 0.0–0.1)
EOS PCT: 6 % — AB (ref 0–5)
Eosinophils Absolute: 0.5 10*3/uL (ref 0.0–0.7)
HCT: 30.7 % — ABNORMAL LOW (ref 39.0–52.0)
Hemoglobin: 10.2 g/dL — ABNORMAL LOW (ref 13.0–17.0)
Lymphocytes Relative: 17 % (ref 12–46)
Lymphs Abs: 1.3 10*3/uL (ref 0.7–4.0)
MCH: 27.2 pg (ref 26.0–34.0)
MCHC: 33.2 g/dL (ref 30.0–36.0)
MCV: 81.9 fL (ref 78.0–100.0)
Monocytes Absolute: 0.5 10*3/uL (ref 0.1–1.0)
Monocytes Relative: 6 % (ref 3–12)
NEUTROS PCT: 70 % (ref 43–77)
Neutro Abs: 5.6 10*3/uL (ref 1.7–7.7)
PLATELETS: 178 10*3/uL (ref 150–400)
RBC: 3.75 MIL/uL — ABNORMAL LOW (ref 4.22–5.81)
RDW: 18.9 % — ABNORMAL HIGH (ref 11.5–15.5)
WBC: 8 10*3/uL (ref 4.0–10.5)

## 2013-09-28 LAB — COMPREHENSIVE METABOLIC PANEL
ALBUMIN: 3.6 g/dL (ref 3.5–5.2)
ALK PHOS: 54 U/L (ref 39–117)
ALT: 9 U/L (ref 0–53)
AST: 14 U/L (ref 0–37)
BUN: 95 mg/dL — AB (ref 6–23)
CALCIUM: 8.7 mg/dL (ref 8.4–10.5)
CO2: 12 mEq/L — ABNORMAL LOW (ref 19–32)
Chloride: 111 mEq/L (ref 96–112)
Creatinine, Ser: 6.36 mg/dL — ABNORMAL HIGH (ref 0.50–1.35)
GFR calc non Af Amer: 9 mL/min — ABNORMAL LOW (ref >90)
GFR, EST AFRICAN AMERICAN: 10 mL/min — AB (ref >90)
Glucose, Bld: 41 mg/dL — CL (ref 70–99)
POTASSIUM: 4.7 meq/L (ref 3.7–5.3)
Sodium: 141 mEq/L (ref 137–147)
Total Bilirubin: 0.4 mg/dL (ref 0.3–1.2)
Total Protein: 9.8 g/dL — ABNORMAL HIGH (ref 6.0–8.3)

## 2013-09-28 LAB — URINALYSIS, ROUTINE W REFLEX MICROSCOPIC
Bilirubin Urine: NEGATIVE
GLUCOSE, UA: NEGATIVE mg/dL
KETONES UR: NEGATIVE mg/dL
LEUKOCYTES UA: NEGATIVE
NITRITE: NEGATIVE
PROTEIN: 30 mg/dL — AB
Specific Gravity, Urine: 1.014 (ref 1.005–1.030)
UROBILINOGEN UA: 0.2 mg/dL (ref 0.0–1.0)
pH: 6.5 (ref 5.0–8.0)

## 2013-09-28 LAB — CBG MONITORING, ED
GLUCOSE-CAPILLARY: 38 mg/dL — AB (ref 70–99)
GLUCOSE-CAPILLARY: 22 mg/dL — AB (ref 70–99)
GLUCOSE-CAPILLARY: 156 mg/dL — AB (ref 70–99)
Glucose-Capillary: 86 mg/dL (ref 70–99)
Glucose-Capillary: 133 mg/dL — ABNORMAL HIGH (ref 70–99)

## 2013-09-28 LAB — LIPASE, BLOOD: Lipase: 65 U/L — ABNORMAL HIGH (ref 11–59)

## 2013-09-28 LAB — I-STAT TROPONIN, ED: Troponin i, poc: 0.04 ng/mL (ref 0.00–0.08)

## 2013-09-28 MED ORDER — ONDANSETRON HCL 4 MG/2ML IJ SOLN
4.0000 mg | Freq: Once | INTRAMUSCULAR | Status: AC
  Administered 2013-09-28: 4 mg via INTRAVENOUS
  Filled 2013-09-28: qty 2

## 2013-09-28 MED ORDER — DEXTROSE 50 % IV SOLN
INTRAVENOUS | Status: AC
  Filled 2013-09-28: qty 50

## 2013-09-28 MED ORDER — DEXTROSE 250 MG/ML IV SOLN: 1.0000 g/kg | Freq: Once | INTRAVENOUS | Status: DC

## 2013-09-28 MED ORDER — DEXTROSE 10 % IV SOLN
INTRAVENOUS | Status: DC
  Administered 2013-09-28: 18:00:00 via INTRAVENOUS

## 2013-09-28 MED ORDER — SODIUM CHLORIDE 0.9 % IV BOLUS (SEPSIS)
250.0000 mL | Freq: Once | INTRAVENOUS | Status: AC
  Administered 2013-09-28: 250 mL via INTRAVENOUS

## 2013-09-28 MED ORDER — DEXTROSE 50 % IV SOLN
25.0000 mL | Freq: Once | INTRAVENOUS | Status: AC
  Administered 2013-09-28: 25 mL via INTRAVENOUS
  Filled 2013-09-28: qty 50

## 2013-09-28 MED ORDER — DEXTROSE 50 % IV SOLN
50.0000 mL | Freq: Once | INTRAVENOUS | Status: AC
  Administered 2013-09-28: 50 mL via INTRAVENOUS

## 2013-09-28 NOTE — ED Provider Notes (Signed)
CSN: [id number]     Arrival date & time 09/28/13  1429 History   First MD Initiated Contact with Patient 09/28/13 1510     Chief Complaint  Patient presents with  . Nausea  . Emesis     (Consider location/radiation/quality/duration/timing/severity/associated sxs/prior Treatment) HPI Comments: Patient presents to the ER for evaluation of vomiting and recurrent hypoglycemia. Patient has reportedly been experiencing nausea and vomiting for approximately a month. These symptoms have been intermittent. He reports he was treated for sinusitis with antibiotics when the symptoms began, but has not improved. He also has been experiencing intermittent "heartburn". This happens every day, not currently being treated for this. He has not had any cough, fever or shortness of breath.  Patient reportedly had an episode of hypoglycemia last night. Around 8 PM his blood sugar was 33. EMS was called, but he had been given oral glucose and a repeat was about 100, was not transported. Patient had a recurrent hypoglycemic episode this morning. He reports that he is on a set dose of insulin, 4 times a day. He has been taking this regularly, but has not been eating much because of the nausea and vomiting. He reports that his blood sugar was 67 at lunch, but took his 15 units of insulin anyway. Blood sugar in triage was 38.    Past Medical History  Diagnosis Date  . Hypertension   . Diabetes mellitus without complication    Past Surgical History  Procedure Laterality Date  . Birth defect heart surgery  1996  . Kidney transplant  2009   No family history on file. History  Substance Use Topics  . Smoking status: Never Smoker   . Smokeless tobacco: Not on file  . Alcohol Use: No    Review of Systems  Constitutional: Negative for fever.  Cardiovascular: Positive for chest pain ("indigestion").  Gastrointestinal: Positive for nausea, vomiting and abdominal pain ("heartburn").  Neurological: Positive for  weakness.      Allergies  Review of patient's allergies indicates no known allergies.  Home Medications   Current Outpatient Rx  Name  Route  Sig  Dispense  Refill  . allopurinol (ZYLOPRIM) 100 MG tablet   Oral   Take 100 mg by mouth daily.         Marland Kitchen amLODipine (NORVASC) 2.5 MG tablet   Oral   Take 2.5 mg by mouth daily.         Marland Kitchen aspirin 81 MG tablet   Oral   Take 81 mg by mouth daily.         . BD PEN NEEDLE NANO U/F 32G X 4 MM MISC      USE 4 TIMES A DAY   400 each   2   . calcitRIOL (ROCALTROL) 0.5 MCG capsule   Oral   Take 0.5 mcg by mouth 3 (three) times daily.          . carvedilol (COREG) 25 MG tablet   Oral   Take 25 mg by mouth 2 (two) times daily with a meal.         . glucose blood (ONE TOUCH ULTRA TEST) test strip      Use 3 times a day to check Blood Sugar.   300 each   0   . Insulin Detemir (LEVEMIR) 100 UNIT/ML Pen   Subcutaneous   Inject 15 Units into the skin at bedtime.         . insulin lispro (HUMALOG) 100 UNIT/ML KiwkPen  Subcutaneous   Inject 5-15 Units into the skin 3 (three) times daily. 5 units at breakfast. 15 units at lunch. 15 units at dinner.         . mycophenolate (CELLCEPT) 250 MG capsule   Oral   Take 250 mg by mouth 2 (two) times daily.         . predniSONE (DELTASONE) 5 MG tablet   Oral   Take 5 mg by mouth daily.         . tacrolimus (PROGRAF) 1 MG capsule   Oral   Take 2 mg by mouth 2 (two) times daily.         . tacrolimus (PROGRAF) 5 MG capsule   Oral   Take 5 mg by mouth 2 (two) times daily.         . tadalafil (CIALIS) 5 MG tablet   Oral   Take 5 mg by mouth daily as needed for erectile dysfunction.          BP 112/61  Pulse 65  Temp(Src) 97.8 F (36.6 C) (Oral)  Resp 18  SpO2 100% Physical Exam  Constitutional: He is oriented to person, place, and time. He appears well-developed and well-nourished. No distress.  HENT:  Head: Normocephalic and atraumatic.  Right Ear:  Hearing normal.  Left Ear: Hearing normal.  Nose: Nose normal.  Mouth/Throat: Oropharynx is clear and moist and mucous membranes are normal.  Eyes: Conjunctivae and EOM are normal. Pupils are equal, round, and reactive to light.  Neck: Normal range of motion. Neck supple.  Cardiovascular: Regular rhythm, S1 normal and S2 normal.  Exam reveals no gallop and no friction rub.   No murmur heard. Pulmonary/Chest: Effort normal and breath sounds normal. No respiratory distress. He exhibits no tenderness.  Abdominal: Soft. Normal appearance and bowel sounds are normal. There is no hepatosplenomegaly. There is generalized tenderness. There is no rebound, no guarding, no tenderness at McBurney's point and negative Murphy's sign. No hernia.  Musculoskeletal: Normal range of motion.  Neurological: He is alert and oriented to person, place, and time. He has normal strength. No cranial nerve deficit or sensory deficit. Coordination normal. GCS eye subscore is 4. GCS verbal subscore is 5. GCS motor subscore is 6.  Skin: Skin is warm, dry and intact. No rash noted. No cyanosis.  Psychiatric: He has a normal mood and affect. His speech is normal and behavior is normal. Thought content normal.    ED Course  Procedures (including critical care time) Labs Review Labs Reviewed  CBC WITH DIFFERENTIAL - Abnormal; Notable for the following:    RBC 3.75 (*)    Hemoglobin 10.2 (*)    HCT 30.7 (*)    RDW 18.9 (*)    Eosinophils Relative 6 (*)    All other components within normal limits  COMPREHENSIVE METABOLIC PANEL - Abnormal; Notable for the following:    CO2 12 (*)    Glucose, Bld 41 (*)    BUN 95 (*)    Creatinine, Ser 6.36 (*)    Total Protein 9.8 (*)    GFR calc non Af Amer 9 (*)    GFR calc Af Amer 10 (*)    All other components within normal limits  LIPASE, BLOOD - Abnormal; Notable for the following:    Lipase 65 (*)    All other components within normal limits  URINALYSIS, ROUTINE W REFLEX  MICROSCOPIC - Abnormal; Notable for the following:    Hgb urine dipstick SMALL (*)  Protein, ur 30 (*)    All other components within normal limits  URINE MICROSCOPIC-ADD ON - Abnormal; Notable for the following:    Squamous Epithelial / LPF FEW (*)    All other components within normal limits  CBG MONITORING, ED - Abnormal; Notable for the following:    Glucose-Capillary 38 (*)    All other components within normal limits  CBG MONITORING, ED - Abnormal; Notable for the following:    Glucose-Capillary 22 (*)    All other components within normal limits  I-STAT TROPOININ, ED  I-STAT CG4 LACTIC ACID, ED  CBG MONITORING, ED   Imaging Review No results found.   EKG Interpretation   Date/Time:  Wednesday September 28 2013 16:07:16 EST Ventricular Rate:  68 PR Interval:  154 QRS Duration: 78 QT Interval:  402 QTC Calculation: 427 R Axis:   44 Text Interpretation:  Normal sinus rhythm T wave abnormality, consider  lateral ischemia Abnormal ECG No previous tracing Confirmed by POLLINA   MD, CHRISTOPHER [phone]) on 09/28/2013 4:44:23 PM      MDM   Final diagnoses:  Hypoglycemia  Chest pain  Acute renal failure  Vomiting   Patient presents to the ER for evaluation of recurrent hypoglycemia. Patient has a history of insulin-dependent diabetes. He reports that he takes insulin 4 times a day. Patient has been taking his insulin, even when he has not been eating and his sugars have been low. He had an episode of hypoglycemia last night, glucose was 33. This improved with oral glucose. Hypoglycemia recurred this morning. Patient was administered IV dextrose here in the ER and has required repeat dosing and is now on a dextrose drip. I suspect that this is secondary to recurrent administration of his insulin despite nausea, vomiting and poor by mouth intake. I do not see any evidence of obvious infection.  Patient does, however, report intermittent episodes of indigestion. An EKG was  performed arrival and does have evidence of T wave inversion in inferior and lateral leads. There is no evidence of ST elevation MI, and troponin is negative. Patient will require further cardiac evaluation based on the symptoms.  Patient does have a previous history of renal transplant. Blood work reveals significantly elevated creatinine of 6.36. Patient appears to be mildly acidotic, CO2 on his comprehensive metabolic panel is 12. This corresponds to a slight anion gap of 18. These findings are felt to be secondary to acute renal failure. Patient might simply be dehydrated from the nausea and vomiting that has been ongoing intermittently for one month, but I am also concerned that he might not be holding down his rejection medications and his elevated creatinine could be consistent with acute graft rejection.  Patient had his transplant surgery at [facility]. It is felt that the patient should be transferred back to [facility] for evaluation of possible graft rejection versus acute renal failure from dehydration. Case was discussed with Doctor Waldron Session has accepted the patient for transfer. Patient is stable for transfer at this time.  CRITICAL CARE Performed by: Orpah Greek   Total critical care time: 60min  Critical care time was exclusive of separately billable procedures and treating other patients.  Critical care was necessary to treat or prevent imminent or life-threatening deterioration.  Critical care was time spent personally by me on the following activities: development of treatment plan with patient and/or surrogate as well as nursing, discussions with consultants, evaluation of patient's response to treatment, examination of patient, obtaining history from patient or  surrogate, ordering and performing treatments and interventions, ordering and review of laboratory studies, ordering and review of radiographic studies, pulse oximetry and re-evaluation of patient's  condition.     Orpah Greek, MD 09/28/13 [phone]

## 2013-09-28 NOTE — ED Notes (Signed)
Pt. C/o nausea and vomiting which has been going on for a month (intermittently) r/t sinus infection treated with claritin and antibiotics (didn't complete course of antibiotics 5/10days).  He also complains of daily heartburn for a couple weaks, not currently being treated.  Pt has hx of diabetes, treated with set insulin (set 4xday).  Pt is currently hypoglycemic and was hypoglycemic yesterday evening as well.  Hx kidney transplant in 2009. Pt denies any pain.  Alert and orientedx4.  No acute distress noted, respirations equal and unlabored.  Skin warm and dry.

## 2013-09-28 NOTE — ED Notes (Signed)
[facility] has been called.  

## 2013-09-28 NOTE — ED Notes (Addendum)
Per EMS: Pt from home with c/o nausea and abdominal pain x 1 month; reports poor PO intake.Pt reports hypoglycemia, CBG 60 with EMS. 138 pal. 98 bpm. 20 RR. AO x4. Hx: kidney transplant.

## 2013-09-28 NOTE — ED Notes (Signed)
Pt is going to be admitted to [facility], they will call once a bed is available.

## 2013-09-28 NOTE — Telephone Encounter (Signed)
Pt states he has been having some issues lately  EMS has been to his assistance twice in 1 month   Nurse please call pt back   Call [phone]  Thank You :)

## 2013-09-28 NOTE — ED Notes (Signed)
Pt given orange juice, more alert now.

## 2013-09-28 NOTE — ED Notes (Signed)
NOTIFIED DR. POLLINA IN PERSON OF PATIENTS LAB RESULTS OF  I- STAT CG4+ LACTIC ACID ,09/28/2013.

## 2013-09-28 NOTE — ED Notes (Signed)
Pt's CBG 38 in Triage.  Pt took his personal packet of 15g Carb oral glucose.  Pt on set amount of insulin regardless of CBG level.  Pt reports his CBG at lunch was 67 and pt took 15 units of Insulin.

## 2013-09-28 NOTE — ED Notes (Signed)
Pt diaphoretic, c/o feeling tired. MD aware.

## 2013-09-28 NOTE — ED Notes (Signed)
[facility] at bedside 

## 2013-09-28 NOTE — ED Notes (Signed)
Spoke with transfer coordinator at [facility], pt has a bed [id number], call [phone] for report.

## 2013-09-28 NOTE — ED Notes (Signed)
[facility] en route, given report to them over the phone. All paper work is at bedside. Pt made aware of room number at [facility] and that [facility] is on the way.

## 2013-09-28 NOTE — Telephone Encounter (Signed)
Returned call and spoke with wife. Pt was being admited to the ER as we were speaking. Instructed to call back with any questions or concerns after things were settled at the ER.

## 2013-09-29 DIAGNOSIS — N179 Acute kidney failure, unspecified: Secondary | ICD-10-CM | POA: Insufficient documentation

## 2013-10-04 DIAGNOSIS — T8610 Unspecified complication of kidney transplant: Secondary | ICD-10-CM | POA: Diagnosis not present

## 2013-10-05 DIAGNOSIS — T8610 Unspecified complication of kidney transplant: Secondary | ICD-10-CM | POA: Diagnosis not present

## 2013-10-12 DIAGNOSIS — N179 Acute kidney failure, unspecified: Secondary | ICD-10-CM | POA: Diagnosis not present

## 2013-10-12 DIAGNOSIS — Z940 Kidney transplant status: Secondary | ICD-10-CM | POA: Diagnosis not present

## 2013-10-12 DIAGNOSIS — N185 Chronic kidney disease, stage 5: Secondary | ICD-10-CM | POA: Insufficient documentation

## 2013-10-12 DIAGNOSIS — I10 Essential (primary) hypertension: Secondary | ICD-10-CM | POA: Diagnosis not present

## 2013-10-14 ENCOUNTER — Ambulatory Visit: Payer: Medicare Other | Admitting: Endocrinology

## 2013-10-18 ENCOUNTER — Encounter: Payer: Self-pay | Admitting: Endocrinology

## 2013-10-18 ENCOUNTER — Ambulatory Visit (INDEPENDENT_AMBULATORY_CARE_PROVIDER_SITE_OTHER): Payer: Medicare Other | Admitting: Endocrinology

## 2013-10-18 VITALS — BP 124/62 | HR 74 | Temp 97.6°F | Ht 67.0 in | Wt 221.0 lb

## 2013-10-18 DIAGNOSIS — E1029 Type 1 diabetes mellitus with other diabetic kidney complication: Secondary | ICD-10-CM | POA: Diagnosis not present

## 2013-10-18 DIAGNOSIS — E109 Type 1 diabetes mellitus without complications: Secondary | ICD-10-CM | POA: Diagnosis not present

## 2013-10-18 NOTE — Patient Instructions (Addendum)
check your blood sugar twice a day.  vary the time of day when you check, between before the 3 meals, and at bedtime.  also check if you have symptoms of your blood sugar being too high or too low.  please keep a record of the readings and bring it to your next appointment here.  please call us sooner if your blood sugar goes below 70, or if you have a lot of readings over 200. As the steroids are reduced, you insulin requirements may decrease further, so you will need to carefully watch this.   Please reduce the levemir to 5 units each morning:   Change the humalog 5 units 3 times a day (just before each meal).   Please come back for a follow-up appointment in 1 month.

## 2013-10-18 NOTE — Progress Notes (Signed)
Subjective:    Patient ID: Patrick [name], male    DOB: 1955-08-24, 58 y.o.   MRN: [id number]  HPI Pt returns for f/u of insulin-requiring DM (dx'ed mid-2014, when he presented with severe hyperglycemia; he has mild neuropathy of the lower extremities, and associated renal failure (transplant in 2009)).  Pt says he can afford the insulin now, and that he takes it as rx'ed.  He says cbg's have been well-controlled, except for an episode of n/v 3 weeks ago.  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  Due to steroid rx, he was rx'ed with prn insulin.  Prednisone is now 5 mg qd.  At [facility], he was changed back to levemir and humalog.  He wants to stay with these, and says he can pay for them.   Past Medical History  Diagnosis Date  . Hypertension   . Diabetes mellitus without complication     Past Surgical History  Procedure Laterality Date  . Birth defect heart surgery  1996  . Kidney transplant  2009    History   Social History  . Marital Status: Married    Spouse Name: N/A    Number of Children: N/A  . Years of Education: N/A   Occupational History  . Not on file.   Social History Main Topics  . Smoking status: Never Smoker   . Smokeless tobacco: Not on file  . Alcohol Use: No  . Drug Use: Not on file  . Sexual Activity: Not on file   Other Topics Concern  . Not on file   Social History Narrative  . No narrative on file    Current Outpatient Prescriptions on File Prior to Visit  Medication Sig Dispense Refill  . amLODipine (NORVASC) 2.5 MG tablet Take 2.5 mg by mouth daily.      Marland Kitchen aspirin 81 MG tablet Take 81 mg by mouth daily.      . BD PEN NEEDLE NANO U/F 32G X 4 MM MISC USE 4 TIMES A DAY  400 each  2  . carvedilol (COREG) 25 MG tablet Take 25 mg by mouth 2 (two) times daily with a meal.      . glucose blood (ONE TOUCH ULTRA TEST) test strip Use 3 times a day to check Blood Sugar.  300 each  0  . Insulin Detemir (LEVEMIR) 100 UNIT/ML Pen  Inject 5 Units into the skin at bedtime.       . insulin lispro (HUMALOG) 100 UNIT/ML KiwkPen Inject 5 Units into the skin 3 (three) times daily.       . mycophenolate (CELLCEPT) 250 MG capsule Take 250 mg by mouth 3 (three) times daily.       . predniSONE (DELTASONE) 5 MG tablet Take 5 mg by mouth daily.      . tacrolimus (PROGRAF) 1 MG capsule Take 2 mg by mouth 2 (two) times daily.      . tacrolimus (PROGRAF) 5 MG capsule Take 5 mg by mouth 2 (two) times daily.      Marland Kitchen allopurinol (ZYLOPRIM) 100 MG tablet Take 100 mg by mouth daily.      . calcitRIOL (ROCALTROL) 0.5 MCG capsule Take 0.5 mcg by mouth 3 (three) times daily.       . tadalafil (CIALIS) 5 MG tablet Take 5 mg by mouth daily as needed for erectile dysfunction.       No current facility-administered medications on file prior to visit.  Allergies  Allergen Reactions  . Iodinated Diagnostic Agents Rash    Other Reaction: burning to mouth, swelling of lips.    No family history on file.  BP 124/62  Pulse 74  Temp(Src) 97.6 F (36.4 C) (Oral)  Ht 5\' 7"  (1.702 m)  Wt 221 lb (100.245 kg)  BMI 34.61 kg/m2  SpO2 99%   Review of Systems He has lost a few lbs.  Denies LOC.     Objective:   Physical Exam VITAL SIGNS:  See vs page GENERAL: no distress    Lab Results  Component Value Date   HGBA1C 6.3 08/02/2013      Assessment & Plan:  DM: his insulin requirements have decreased, due to his recent illness and probably also associated weight loss). Acute rejection: this was not caused by insulin, but the tapering of his steroids may further reduce his insulin requirment.

## 2013-10-20 DIAGNOSIS — E119 Type 2 diabetes mellitus without complications: Secondary | ICD-10-CM | POA: Insufficient documentation

## 2013-10-24 ENCOUNTER — Encounter: Payer: Self-pay | Admitting: *Deleted

## 2013-10-24 ENCOUNTER — Encounter: Payer: Self-pay | Admitting: Cardiovascular Disease

## 2013-10-25 ENCOUNTER — Ambulatory Visit: Payer: Medicare Other | Admitting: Cardiovascular Disease

## 2013-11-07 ENCOUNTER — Ambulatory Visit (INDEPENDENT_AMBULATORY_CARE_PROVIDER_SITE_OTHER): Payer: Medicare Other | Admitting: Endocrinology

## 2013-11-07 ENCOUNTER — Encounter: Payer: Self-pay | Admitting: Endocrinology

## 2013-11-07 VITALS — BP 112/60 | HR 80 | Temp 98.6°F | Ht 67.0 in | Wt 222.0 lb

## 2013-11-07 DIAGNOSIS — E1029 Type 1 diabetes mellitus with other diabetic kidney complication: Secondary | ICD-10-CM | POA: Diagnosis not present

## 2013-11-07 LAB — HEMOGLOBIN A1C: Hgb A1c MFr Bld: 8.2 % — ABNORMAL HIGH (ref 4.6–6.5)

## 2013-11-07 NOTE — Progress Notes (Signed)
Subjective:    Patient ID: Patrick [name], male    DOB: 06-11-1956, 58 y.o.   MRN: [id number]  HPI Pt returns for f/u of insulin-requiring DM (dx'ed mid-2014, when he presented with severe hyperglycemia; he has mild neuropathy of the lower extremities, and associated renal failure (transplant in 2009)).  he brings a record of his cbg's which i have reviewed today.  It varies from 100-230.  It is in general higher as the day goes on.  He denies hypoglycemia.   Past Medical History  Diagnosis Date  . Hypertension   . Diabetes mellitus without complication   . End-stage renal failure with renal transplant   . Gout   . Impotence of organic origin   . Type I (juvenile type) diabetes mellitus with renal manifestations, not stated as uncontrolled   . Secondary hyperparathyroidism     Past Surgical History  Procedure Laterality Date  . Birth defect heart surgery  1996  . Kidney transplant  2009    History   Social History  . Marital Status: Married    Spouse Name: N/A    Number of Children: N/A  . Years of Education: N/A   Occupational History  . Not on file.   Social History Main Topics  . Smoking status: Never Smoker   . Smokeless tobacco: Not on file  . Alcohol Use: No  . Drug Use: Not on file  . Sexual Activity: Not on file   Other Topics Concern  . Not on file   Social History Narrative  . No narrative on file    Current Outpatient Prescriptions on File Prior to Visit  Medication Sig Dispense Refill  . allopurinol (ZYLOPRIM) 100 MG tablet Take 100 mg by mouth daily.      Marland Kitchen amLODipine (NORVASC) 2.5 MG tablet Take 2.5 mg by mouth daily.      Marland Kitchen aspirin 81 MG tablet Take 81 mg by mouth daily.      . BD PEN NEEDLE NANO U/F 32G X 4 MM MISC USE 4 TIMES A DAY  400 each  2  . calcitRIOL (ROCALTROL) 0.5 MCG capsule Take 0.5 mcg by mouth 3 (three) times daily.       . carvedilol (COREG) 25 MG tablet Take 25 mg by mouth 2 (two) times daily with a meal.      . glucose blood  (ONE TOUCH ULTRA TEST) test strip Use 3 times a day to check Blood Sugar.  300 each  0  . Insulin Detemir (LEVEMIR) 100 UNIT/ML Pen Inject 5 Units into the skin at bedtime.       . insulin lispro (HUMALOG) 100 UNIT/ML KiwkPen Inject 7 Units into the skin 3 (three) times daily.       Marland Kitchen levofloxacin (LEVAQUIN) 500 MG tablet       . mycophenolate (CELLCEPT) 250 MG capsule Take 250 mg by mouth 3 (three) times daily.       . ondansetron (ZOFRAN-ODT) 8 MG disintegrating tablet       . predniSONE (DELTASONE) 5 MG tablet Take 5 mg by mouth daily.      . sodium bicarbonate 650 MG tablet Take 650 mg by mouth 4 (four) times daily.      . tacrolimus (PROGRAF) 1 MG capsule Take 2 mg by mouth 2 (two) times daily.      . tacrolimus (PROGRAF) 5 MG capsule Take 5 mg by mouth 2 (two) times daily.      . tadalafil (CIALIS) 5  MG tablet Take 5 mg by mouth daily as needed for erectile dysfunction.       No current facility-administered medications on file prior to visit.    Allergies  Allergen Reactions  . Iodinated Diagnostic Agents Rash    Other Reaction: burning to mouth, swelling of lips.    No family history on file.  BP 112/60  Pulse 80  Temp(Src) 98.6 F (37 C) (Oral)  Ht 5\' 7"  (1.702 m)  Wt 222 lb (100.699 kg)  BMI 34.76 kg/m2  SpO2 97%  Review of Systems He denies weight change and n/v.      Objective:   Physical Exam VITAL SIGNS:  See vs page GENERAL: no distress   Lab Results  Component Value Date   HGBA1C 8.2* 11/07/2013      Assessment & Plan:  DM: he needs increased rx.  This insulin regimen was chosen from multiple options, as it best matches his insulin to his changing requirements throughout the day.  The benefits of glycemic control must be weighed against the risks of hypoglycemia.   Renal failure: this sometimes removes the need for basal insulin, but he has had a transplant.

## 2013-11-07 NOTE — Patient Instructions (Addendum)
check your blood sugar twice a day.  vary the time of day when you check, between before the 3 meals, and at bedtime.  also check if you have symptoms of your blood sugar being too high or too low.  please keep a record of the readings and bring it to your next appointment here.  please call us sooner if your blood sugar goes below 70, or if you have a lot of readings over 200. As the steroids are reduced, you insulin requirements may decrease further, so you will need to carefully watch this.    Please come back for a follow-up appointment in 3 months.  blood tests are being requested for you today.  We'll contact you with results.

## 2013-11-09 ENCOUNTER — Encounter: Payer: Self-pay | Admitting: Cardiovascular Disease

## 2013-11-23 ENCOUNTER — Inpatient Hospital Stay (HOSPITAL_COMMUNITY)
Admission: EM | Admit: 2013-11-23 | Discharge: 2013-11-29 | DRG: 699 | Disposition: A | Discharge status: Home / Self Care | Payer: Medicare Other | Attending: Internal Medicine | Admitting: Internal Medicine

## 2013-11-23 ENCOUNTER — Encounter (HOSPITAL_COMMUNITY): Payer: Self-pay | Admitting: Emergency Medicine

## 2013-11-23 DIAGNOSIS — E878 Other disorders of electrolyte and fluid balance, not elsewhere classified: Secondary | ICD-10-CM

## 2013-11-23 DIAGNOSIS — E876 Hypokalemia: Secondary | ICD-10-CM | POA: Diagnosis not present

## 2013-11-23 DIAGNOSIS — E1029 Type 1 diabetes mellitus with other diabetic kidney complication: Secondary | ICD-10-CM | POA: Diagnosis present

## 2013-11-23 DIAGNOSIS — I472 Ventricular tachycardia, unspecified: Secondary | ICD-10-CM | POA: Diagnosis not present

## 2013-11-23 DIAGNOSIS — N186 End stage renal disease: Secondary | ICD-10-CM

## 2013-11-23 DIAGNOSIS — D649 Anemia, unspecified: Secondary | ICD-10-CM

## 2013-11-23 DIAGNOSIS — E872 Acidosis, unspecified: Secondary | ICD-10-CM | POA: Diagnosis not present

## 2013-11-23 DIAGNOSIS — Y830 Surgical operation with transplant of whole organ as the cause of abnormal reaction of the patient, or of later complication, without mention of misadventure at the time of the procedure: Secondary | ICD-10-CM | POA: Diagnosis present

## 2013-11-23 DIAGNOSIS — M109 Gout, unspecified: Secondary | ICD-10-CM | POA: Diagnosis present

## 2013-11-23 DIAGNOSIS — E119 Type 2 diabetes mellitus without complications: Secondary | ICD-10-CM | POA: Diagnosis present

## 2013-11-23 DIAGNOSIS — N189 Chronic kidney disease, unspecified: Secondary | ICD-10-CM | POA: Diagnosis not present

## 2013-11-23 DIAGNOSIS — T8610 Unspecified complication of kidney transplant: Secondary | ICD-10-CM | POA: Diagnosis not present

## 2013-11-23 DIAGNOSIS — Z794 Long term (current) use of insulin: Secondary | ICD-10-CM

## 2013-11-23 DIAGNOSIS — Z940 Kidney transplant status: Secondary | ICD-10-CM | POA: Diagnosis not present

## 2013-11-23 DIAGNOSIS — T8619 Other complication of kidney transplant: Secondary | ICD-10-CM

## 2013-11-23 DIAGNOSIS — H05229 Edema of unspecified orbit: Secondary | ICD-10-CM | POA: Diagnosis present

## 2013-11-23 DIAGNOSIS — I379 Nonrheumatic pulmonary valve disorder, unspecified: Secondary | ICD-10-CM | POA: Diagnosis not present

## 2013-11-23 DIAGNOSIS — R5383 Other fatigue: Secondary | ICD-10-CM | POA: Diagnosis not present

## 2013-11-23 DIAGNOSIS — I120 Hypertensive chronic kidney disease with stage 5 chronic kidney disease or end stage renal disease: Secondary | ICD-10-CM | POA: Diagnosis present

## 2013-11-23 DIAGNOSIS — R9431 Abnormal electrocardiogram [ECG] [EKG]: Secondary | ICD-10-CM | POA: Diagnosis not present

## 2013-11-23 DIAGNOSIS — Z7982 Long term (current) use of aspirin: Secondary | ICD-10-CM

## 2013-11-23 DIAGNOSIS — T8611 Kidney transplant rejection: Secondary | ICD-10-CM | POA: Diagnosis present

## 2013-11-23 DIAGNOSIS — I4729 Other ventricular tachycardia: Secondary | ICD-10-CM | POA: Diagnosis present

## 2013-11-23 DIAGNOSIS — N185 Chronic kidney disease, stage 5: Secondary | ICD-10-CM | POA: Diagnosis present

## 2013-11-23 DIAGNOSIS — N2581 Secondary hyperparathyroidism of renal origin: Secondary | ICD-10-CM | POA: Diagnosis present

## 2013-11-23 DIAGNOSIS — N058 Unspecified nephritic syndrome with other morphologic changes: Secondary | ICD-10-CM | POA: Diagnosis present

## 2013-11-23 DIAGNOSIS — E1169 Type 2 diabetes mellitus with other specified complication: Secondary | ICD-10-CM | POA: Diagnosis not present

## 2013-11-23 DIAGNOSIS — E11649 Type 2 diabetes mellitus with hypoglycemia without coma: Secondary | ICD-10-CM

## 2013-11-23 DIAGNOSIS — N039 Chronic nephritic syndrome with unspecified morphologic changes: Secondary | ICD-10-CM

## 2013-11-23 DIAGNOSIS — E669 Obesity, unspecified: Secondary | ICD-10-CM | POA: Diagnosis present

## 2013-11-23 DIAGNOSIS — R404 Transient alteration of awareness: Secondary | ICD-10-CM | POA: Diagnosis not present

## 2013-11-23 DIAGNOSIS — D631 Anemia in chronic kidney disease: Secondary | ICD-10-CM | POA: Diagnosis present

## 2013-11-23 DIAGNOSIS — R5381 Other malaise: Secondary | ICD-10-CM | POA: Diagnosis not present

## 2013-11-23 LAB — CBC WITH DIFFERENTIAL/PLATELET
BASOS PCT: 1 % (ref 0–1)
Basophils Absolute: 0.1 10*3/uL (ref 0.0–0.1)
EOS ABS: 0.1 10*3/uL (ref 0.0–0.7)
EOS PCT: 2 % (ref 0–5)
HCT: 18.6 % — ABNORMAL LOW (ref 39.0–52.0)
Hemoglobin: 6.2 g/dL — CL (ref 13.0–17.0)
Lymphocytes Relative: 9 % — ABNORMAL LOW (ref 12–46)
Lymphs Abs: 0.7 10*3/uL (ref 0.7–4.0)
MCH: 27.6 pg (ref 26.0–34.0)
MCHC: 33.3 g/dL (ref 30.0–36.0)
MCV: 82.7 fL (ref 78.0–100.0)
Monocytes Absolute: 0.6 10*3/uL (ref 0.1–1.0)
Monocytes Relative: 9 % (ref 3–12)
Neutro Abs: 5.9 10*3/uL (ref 1.7–7.7)
Neutrophils Relative %: 80 % — ABNORMAL HIGH (ref 43–77)
PLATELETS: 184 10*3/uL (ref 150–400)
RBC: 2.25 MIL/uL — ABNORMAL LOW (ref 4.22–5.81)
RDW: 20.3 % — ABNORMAL HIGH (ref 11.5–15.5)
WBC: 7.4 10*3/uL (ref 4.0–10.5)

## 2013-11-23 LAB — I-STAT TROPONIN, ED: Troponin i, poc: 0.06 ng/mL (ref 0.00–0.08)

## 2013-11-23 MED ORDER — ONDANSETRON 4 MG PO TBDP
8.0000 mg | ORAL_TABLET | Freq: Once | ORAL | Status: AC
  Administered 2013-11-23: 8 mg via ORAL
  Filled 2013-11-23: qty 2

## 2013-11-23 NOTE — ED Notes (Signed)
Lab report HGB 6.2  Nurse first notified.

## 2013-11-23 NOTE — ED Notes (Addendum)
Per ems-- pt reports nausea that started this morning with generalized weakness, pt vomited x3. sts he just hasnt felt right all day. Hx liver transplant 5 years ago. Also hx heart valve replacement. Pt also reports some sob- denies any pain.

## 2013-11-24 ENCOUNTER — Encounter (HOSPITAL_COMMUNITY): Payer: Self-pay | Admitting: *Deleted

## 2013-11-24 DIAGNOSIS — N186 End stage renal disease: Secondary | ICD-10-CM

## 2013-11-24 DIAGNOSIS — T8610 Unspecified complication of kidney transplant: Principal | ICD-10-CM

## 2013-11-24 DIAGNOSIS — Z940 Kidney transplant status: Secondary | ICD-10-CM | POA: Diagnosis not present

## 2013-11-24 DIAGNOSIS — E872 Acidosis: Secondary | ICD-10-CM | POA: Diagnosis not present

## 2013-11-24 DIAGNOSIS — T8611 Kidney transplant rejection: Secondary | ICD-10-CM | POA: Diagnosis present

## 2013-11-24 DIAGNOSIS — D631 Anemia in chronic kidney disease: Secondary | ICD-10-CM | POA: Diagnosis present

## 2013-11-24 DIAGNOSIS — D649 Anemia, unspecified: Secondary | ICD-10-CM | POA: Diagnosis not present

## 2013-11-24 DIAGNOSIS — E878 Other disorders of electrolyte and fluid balance, not elsewhere classified: Secondary | ICD-10-CM | POA: Diagnosis not present

## 2013-11-24 DIAGNOSIS — N185 Chronic kidney disease, stage 5: Secondary | ICD-10-CM | POA: Diagnosis not present

## 2013-11-24 DIAGNOSIS — N189 Chronic kidney disease, unspecified: Secondary | ICD-10-CM

## 2013-11-24 DIAGNOSIS — E1029 Type 1 diabetes mellitus with other diabetic kidney complication: Secondary | ICD-10-CM

## 2013-11-24 LAB — BASIC METABOLIC PANEL
BUN: 54 mg/dL — ABNORMAL HIGH (ref 6–23)
BUN: 51 mg/dL — ABNORMAL HIGH (ref 6–23)
CALCIUM: 5.3 mg/dL — AB (ref 8.4–10.5)
CALCIUM: 5.5 mg/dL — AB (ref 8.4–10.5)
CO2: 10 mEq/L — CL (ref 19–32)
CO2: 11 mEq/L — ABNORMAL LOW (ref 19–32)
CREATININE: 4.49 mg/dL — AB (ref 0.50–1.35)
Chloride: 111 mEq/L (ref 96–112)
Chloride: 110 mEq/L (ref 96–112)
Creatinine, Ser: 4.73 mg/dL — ABNORMAL HIGH (ref 0.50–1.35)
GFR calc Af Amer: 15 mL/min — ABNORMAL LOW (ref >90)
GFR calc Af Amer: 14 mL/min — ABNORMAL LOW (ref >90)
GFR calc non Af Amer: 13 mL/min — ABNORMAL LOW (ref >90)
GFR calc non Af Amer: 12 mL/min — ABNORMAL LOW (ref >90)
GLUCOSE: 100 mg/dL — AB (ref 70–99)
GLUCOSE: 133 mg/dL — AB (ref 70–99)
Potassium: 3.4 mEq/L — ABNORMAL LOW (ref 3.7–5.3)
Potassium: 3.8 mEq/L (ref 3.7–5.3)
SODIUM: 139 meq/L (ref 137–147)
Sodium: 141 mEq/L (ref 137–147)

## 2013-11-24 LAB — HEMOGLOBIN AND HEMATOCRIT, BLOOD
HCT: 20.9 % — ABNORMAL LOW (ref 39.0–52.0)
Hemoglobin: 7 g/dL — ABNORMAL LOW (ref 13.0–17.0)

## 2013-11-24 LAB — URINE MICROSCOPIC-ADD ON

## 2013-11-24 LAB — URINALYSIS, ROUTINE W REFLEX MICROSCOPIC
Bilirubin Urine: NEGATIVE
Glucose, UA: NEGATIVE mg/dL
Ketones, ur: NEGATIVE mg/dL
Leukocytes, UA: NEGATIVE
Nitrite: NEGATIVE
Protein, ur: 30 mg/dL — AB
Specific Gravity, Urine: 1.012 (ref 1.005–1.030)
UROBILINOGEN UA: 0.2 mg/dL (ref 0.0–1.0)
pH: 6.5 (ref 5.0–8.0)

## 2013-11-24 LAB — FERRITIN: Ferritin: 1245 ng/mL — ABNORMAL HIGH (ref 22–322)

## 2013-11-24 LAB — COMPREHENSIVE METABOLIC PANEL
ALT: 6 U/L (ref 0–53)
AST: 8 U/L (ref 0–37)
Albumin: 3.9 g/dL (ref 3.5–5.2)
Alkaline Phosphatase: 57 U/L (ref 39–117)
BUN: 57 mg/dL — ABNORMAL HIGH (ref 6–23)
CALCIUM: 5.4 mg/dL — AB (ref 8.4–10.5)
CO2: 10 mEq/L — CL (ref 19–32)
Chloride: 110 mEq/L (ref 96–112)
Creatinine, Ser: 4.81 mg/dL — ABNORMAL HIGH (ref 0.50–1.35)
GFR calc non Af Amer: 12 mL/min — ABNORMAL LOW (ref >90)
GFR, EST AFRICAN AMERICAN: 14 mL/min — AB (ref >90)
Glucose, Bld: 96 mg/dL (ref 70–99)
Potassium: 3.5 mEq/L — ABNORMAL LOW (ref 3.7–5.3)
Sodium: 141 mEq/L (ref 137–147)
Total Bilirubin: 0.3 mg/dL (ref 0.3–1.2)
Total Protein: 8.1 g/dL (ref 6.0–8.3)

## 2013-11-24 LAB — ABO/RH: ABO/RH(D): O POS

## 2013-11-24 LAB — IRON AND TIBC
IRON: 105 ug/dL (ref 42–135)
SATURATION RATIOS: 44 % (ref 20–55)
TIBC: 237 ug/dL (ref 215–435)
UIBC: 132 ug/dL (ref 125–400)

## 2013-11-24 LAB — GLUCOSE, CAPILLARY
GLUCOSE-CAPILLARY: 86 mg/dL (ref 70–99)
GLUCOSE-CAPILLARY: 88 mg/dL (ref 70–99)
Glucose-Capillary: 103 mg/dL — ABNORMAL HIGH (ref 70–99)
Glucose-Capillary: 140 mg/dL — ABNORMAL HIGH (ref 70–99)

## 2013-11-24 LAB — RETICULOCYTES
RBC.: 2.14 MIL/uL — ABNORMAL LOW (ref 4.22–5.81)
RETIC COUNT ABSOLUTE: 21.4 10*3/uL (ref 19.0–186.0)
Retic Ct Pct: 1 % (ref 0.4–3.1)

## 2013-11-24 LAB — LACTIC ACID, PLASMA: LACTIC ACID, VENOUS: 1 mmol/L (ref 0.5–2.2)

## 2013-11-24 LAB — PHOSPHORUS: Phosphorus: 4.1 mg/dL (ref 2.3–4.6)

## 2013-11-24 LAB — POC OCCULT BLOOD, ED: Fecal Occult Bld: NEGATIVE

## 2013-11-24 LAB — PREPARE RBC (CROSSMATCH)

## 2013-11-24 LAB — FOLATE: FOLATE: 9.7 ng/mL

## 2013-11-24 LAB — MAGNESIUM: MAGNESIUM: 1.2 mg/dL — AB (ref 1.5–2.5)

## 2013-11-24 LAB — VITAMIN B12: VITAMIN B 12: 514 pg/mL (ref 211–911)

## 2013-11-24 MED ORDER — SODIUM CHLORIDE 0.9 % IJ SOLN
3.0000 mL | Freq: Two times a day (BID) | INTRAMUSCULAR | Status: DC
  Administered 2013-11-24 – 2013-11-29 (×8): 3 mL via INTRAVENOUS

## 2013-11-24 MED ORDER — INSULIN DETEMIR 100 UNIT/ML ~~LOC~~ SOLN
5.0000 [IU] | Freq: Every day | SUBCUTANEOUS | Status: DC
  Administered 2013-11-24 – 2013-11-28 (×5): 5 [IU] via SUBCUTANEOUS
  Filled 2013-11-24 (×7): qty 0.05

## 2013-11-24 MED ORDER — DARBEPOETIN ALFA-POLYSORBATE 100 MCG/0.5ML IJ SOLN
100.0000 ug | Freq: Once | INTRAMUSCULAR | Status: AC
  Administered 2013-11-24: 100 ug via SUBCUTANEOUS
  Filled 2013-11-24: qty 0.5

## 2013-11-24 MED ORDER — TACROLIMUS 1 MG PO CAPS: 2.0000 mg | ORAL_CAPSULE | Freq: Two times a day (BID) | ORAL | Status: DC

## 2013-11-24 MED ORDER — STERILE WATER FOR INJECTION IV SOLN
INTRAVENOUS | Status: DC
  Administered 2013-11-24: 06:00:00 via INTRAVENOUS
  Filled 2013-11-24 (×3): qty 850

## 2013-11-24 MED ORDER — ASPIRIN 81 MG PO CHEW
81.0000 mg | CHEWABLE_TABLET | Freq: Every day | ORAL | Status: DC
  Administered 2013-11-24 – 2013-11-29 (×6): 81 mg via ORAL
  Filled 2013-11-24 (×9): qty 1

## 2013-11-24 MED ORDER — PREDNISONE 5 MG PO TABS
5.0000 mg | ORAL_TABLET | Freq: Every day | ORAL | Status: DC
  Administered 2013-11-24 – 2013-11-29 (×6): 5 mg via ORAL
  Filled 2013-11-24 (×6): qty 1

## 2013-11-24 MED ORDER — POTASSIUM CHLORIDE CRYS ER 20 MEQ PO TBCR
40.0000 meq | EXTENDED_RELEASE_TABLET | Freq: Once | ORAL | Status: AC
Start: 2013-11-24 — End: 2013-11-24
  Administered 2013-11-24: 40 meq via ORAL
  Filled 2013-11-24: qty 2

## 2013-11-24 MED ORDER — SODIUM CHLORIDE 0.9 % IV SOLN
1.0000 g | Freq: Once | INTRAVENOUS | Status: AC
  Administered 2013-11-24: 1 g via INTRAVENOUS
  Filled 2013-11-24: qty 10

## 2013-11-24 MED ORDER — MAGNESIUM SULFATE 40 MG/ML IJ SOLN
2.0000 g | Freq: Once | INTRAMUSCULAR | Status: AC
Start: 2013-11-24 — End: 2013-11-24
  Administered 2013-11-24: 2 g via INTRAVENOUS
  Filled 2013-11-24: qty 50

## 2013-11-24 MED ORDER — TACROLIMUS 1 MG PO CAPS
7.0000 mg | ORAL_CAPSULE | Freq: Two times a day (BID) | ORAL | Status: DC
  Administered 2013-11-24 – 2013-11-29 (×11): 7 mg via ORAL
  Filled 2013-11-24 (×13): qty 7
  Filled 2013-11-24: qty 1

## 2013-11-24 MED ORDER — AMLODIPINE BESYLATE 2.5 MG PO TABS
2.5000 mg | ORAL_TABLET | Freq: Every day | ORAL | Status: DC
  Administered 2013-11-24 – 2013-11-29 (×4): 2.5 mg via ORAL
  Filled 2013-11-24 (×7): qty 1

## 2013-11-24 MED ORDER — MYCOPHENOLATE MOFETIL 250 MG PO CAPS
750.0000 mg | ORAL_CAPSULE | Freq: Three times a day (TID) | ORAL | Status: DC
  Filled 2013-11-24 (×3): qty 3

## 2013-11-24 MED ORDER — CARVEDILOL 25 MG PO TABS
25.0000 mg | ORAL_TABLET | Freq: Two times a day (BID) | ORAL | Status: DC
  Administered 2013-11-24 – 2013-11-29 (×11): 25 mg via ORAL
  Filled 2013-11-24 (×14): qty 1

## 2013-11-24 MED ORDER — CALCIUM GLUCONATE 10 % IV SOLN
1.0000 g | Freq: Once | INTRAVENOUS | Status: AC
  Administered 2013-11-24: 1 g via INTRAVENOUS
  Filled 2013-11-24: qty 10

## 2013-11-24 MED ORDER — INSULIN ASPART 100 UNIT/ML ~~LOC~~ SOLN
7.0000 [IU] | Freq: Three times a day (TID) | SUBCUTANEOUS | Status: DC
  Administered 2013-11-24 – 2013-11-28 (×9): 7 [IU] via SUBCUTANEOUS

## 2013-11-24 MED ORDER — INSULIN ASPART 100 UNIT/ML ~~LOC~~ SOLN: 0.0000 [IU] | Freq: Every day | SUBCUTANEOUS | Status: DC

## 2013-11-24 MED ORDER — TACROLIMUS 1 MG PO CAPS: 5.0000 mg | ORAL_CAPSULE | Freq: Two times a day (BID) | ORAL | Status: DC

## 2013-11-24 MED ORDER — STERILE WATER FOR INJECTION IV SOLN
150.0000 meq | INTRAVENOUS | Status: DC
  Administered 2013-11-24 – 2013-11-27 (×5): 150 meq via INTRAVENOUS
  Filled 2013-11-24 (×6): qty 850

## 2013-11-24 MED ORDER — HEPARIN SODIUM (PORCINE) 5000 UNIT/ML IJ SOLN
5000.0000 [IU] | Freq: Three times a day (TID) | INTRAMUSCULAR | Status: DC
  Administered 2013-11-24 – 2013-11-29 (×14): 5000 [IU] via SUBCUTANEOUS
  Filled 2013-11-24 (×21): qty 1

## 2013-11-24 MED ORDER — INSULIN ASPART 100 UNIT/ML ~~LOC~~ SOLN
0.0000 [IU] | Freq: Three times a day (TID) | SUBCUTANEOUS | Status: DC
  Administered 2013-11-26: 1 [IU] via SUBCUTANEOUS
  Administered 2013-11-27 – 2013-11-29 (×3): 2 [IU] via SUBCUTANEOUS

## 2013-11-24 MED ORDER — MYCOPHENOLATE MOFETIL 250 MG PO CAPS
750.0000 mg | ORAL_CAPSULE | Freq: Two times a day (BID) | ORAL | Status: DC
  Administered 2013-11-24 – 2013-11-29 (×11): 750 mg via ORAL
  Filled 2013-11-24 (×14): qty 3

## 2013-11-24 NOTE — ED Provider Notes (Signed)
CSN: [id number]     Arrival date & time 11/23/13  2204 History   First MD Initiated Contact with Patient 11/23/13 2354     Chief Complaint  Patient presents with  . Weakness      The history is provided by the patient.   patient brought to the emergency department for nausea, generalized weakness, vomiting x3 today.  His history of kidney transplant.  She's compliant with his medications.  He denies abdominal pain or pain over his transplanted kidney in his right lower quadrant.  He has no urinary complaints.  He continues to make urine.  His kidney transplant is managed at [facility].  Currently his physicians are concerned about developing rejection and then increase his medications and is being followed closely for this.  He states generalized weakness over the past several days.  Denies melena and hematochezia.  No hematemesis.    Past Medical History  Diagnosis Date  . Hypertension   . Diabetes mellitus without complication   . End-stage renal failure with renal transplant   . Gout   . Impotence of organic origin   . Type I (juvenile type) diabetes mellitus with renal manifestations, not stated as uncontrolled   . Secondary hyperparathyroidism    Past Surgical History  Procedure Laterality Date  . Birth defect heart surgery  1996  . Kidney transplant  2009   No family history on file. History  Substance Use Topics  . Smoking status: Never Smoker   . Smokeless tobacco: Not on file  . Alcohol Use: No    Review of Systems  Neurological: Positive for weakness.  All other systems reviewed and are negative.     Allergies  Iodinated diagnostic agents  Home Medications   Prior to Admission medications   Medication Sig Start Date End Date Taking? Authorizing Provider  allopurinol (ZYLOPRIM) 100 MG tablet Take 100 mg by mouth daily.    Historical Provider, MD  amLODipine (NORVASC) 2.5 MG tablet Take 2.5 mg by mouth daily.    Historical Provider, MD  aspirin 81 MG  tablet Take 81 mg by mouth daily.    Historical Provider, MD  BD PEN NEEDLE NANO U/F 32G X 4 MM MISC USE 4 TIMES A DAY 08/09/13   Renato Shin, MD  calcitRIOL (ROCALTROL) 0.5 MCG capsule Take 0.5 mcg by mouth 3 (three) times daily.     Historical Provider, MD  carvedilol (COREG) 25 MG tablet Take 25 mg by mouth 2 (two) times daily with a meal.    Historical Provider, MD  glucose blood (ONE TOUCH ULTRA TEST) test strip Use 3 times a day to check Blood Sugar. 09/05/13   Renato Shin, MD  Insulin Detemir (LEVEMIR) 100 UNIT/ML Pen Inject 5 Units into the skin at bedtime.  09/06/13   Renato Shin, MD  insulin lispro (HUMALOG) 100 UNIT/ML KiwkPen Inject 7 Units into the skin 3 (three) times daily.  09/06/13   Renato Shin, MD  levofloxacin (LEVAQUIN) 500 MG tablet  09/09/13   Historical Provider, MD  mycophenolate (CELLCEPT) 250 MG capsule Take 250 mg by mouth 3 (three) times daily.     Historical Provider, MD  ondansetron (ZOFRAN-ODT) 8 MG disintegrating tablet  09/26/13   Historical Provider, MD  predniSONE (DELTASONE) 5 MG tablet Take 5 mg by mouth daily.    Historical Provider, MD  sodium bicarbonate 650 MG tablet Take 650 mg by mouth 4 (four) times daily.    Historical Provider, MD  tacrolimus (PROGRAF) 1 MG  capsule Take 2 mg by mouth 2 (two) times daily.    Historical Provider, MD  tacrolimus (PROGRAF) 5 MG capsule Take 5 mg by mouth 2 (two) times daily.    Historical Provider, MD  tadalafil (CIALIS) 5 MG tablet Take 5 mg by mouth daily as needed for erectile dysfunction.    Historical Provider, MD   BP 114/60  Pulse 86  Temp(Src) 98.9 F (37.2 C) (Oral)  Resp 17  SpO2 100% Physical Exam  Nursing note and vitals reviewed. Constitutional: He is oriented to person, place, and time. He appears well-developed and well-nourished.  HENT:  Head: Normocephalic and atraumatic.  Eyes: EOM are normal.  Neck: Normal range of motion.  Cardiovascular: Normal rate, regular rhythm, normal heart sounds and  intact distal pulses.   Pulmonary/Chest: Effort normal and breath sounds normal. No respiratory distress.  Abdominal: Soft. He exhibits no distension. There is no tenderness.  No tenderness in the right lower quadrant  Musculoskeletal: Normal range of motion.  Neurological: He is alert and oriented to person, place, and time.  Skin: Skin is warm and dry.  Psychiatric: He has a normal mood and affect. Judgment normal.    ED Course  Procedures (including critical care time)  CRITICAL CARE Performed by: Hoy Morn Total critical care time: 32 Critical care time was exclusive of separately billable procedures and treating other patients. Critical care was necessary to treat or prevent imminent or life-threatening deterioration. Critical care was time spent personally by me on the following activities: development of treatment plan with patient and/or surrogate as well as nursing, discussions with consultants, evaluation of patient's response to treatment, examination of patient, obtaining history from patient or surrogate, ordering and performing treatments and interventions, ordering and review of laboratory studies, ordering and review of radiographic studies, pulse oximetry and re-evaluation of patient's condition.   Labs Review Labs Reviewed  CBC WITH DIFFERENTIAL - Abnormal; Notable for the following:    RBC 2.25 (*)    Hemoglobin 6.2 (*)    HCT 18.6 (*)    RDW 20.3 (*)    Neutrophils Relative % 80 (*)    Lymphocytes Relative 9 (*)    All other components within normal limits  COMPREHENSIVE METABOLIC PANEL - Abnormal; Notable for the following:    Potassium 3.5 (*)    CO2 10 (*)    BUN 57 (*)    Creatinine, Ser 4.81 (*)    Calcium 5.4 (*)    GFR calc non Af Amer 12 (*)    GFR calc Af Amer 14 (*)    All other components within normal limits  RETICULOCYTES - Abnormal; Notable for the following:    RBC. 2.14 (*)    All other components within normal limits  URINALYSIS,  ROUTINE W REFLEX MICROSCOPIC  VITAMIN B12  FOLATE  IRON AND TIBC  FERRITIN  I-STAT TROPOININ, ED  POC OCCULT BLOOD, ED  TYPE AND SCREEN  PREPARE RBC (CROSSMATCH)  ABO/RH    Imaging Review No results found.   EKG Interpretation   Date/Time:  Wednesday November 23 2013 22:27:00 EDT Ventricular Rate:  86 PR Interval:  138 QRS Duration: 92 QT Interval:  408 QTC Calculation: 488 R Axis:   27 Text Interpretation:  Normal sinus rhythm Moderate voltage criteria for  LVH, may be normal variant ST \\T \ T wave abnormality, consider lateral  ischemia Prolonged QT Abnormal ECG No significant change was found  Confirmed by Hykeem Ojeda  MD, Marquelle Musgrave ([id number]) on 11/24/2013 12:24:46  AM      MDM   Final diagnoses:  Anemia  Hypocalcemia    BUN/creatinine is improved from his prior kidney function at [facility].  His bicarbonate is 10 but it has been anywhere from 12-14 in the past over the past 6 weeks at [facility].  His doctors have him on bicarbonate therapy at home.  Patient be admitted the hospital.  Blood transfusion now.  Patient we managed in the hospital by the hospitalist service.  Family updated.  Patient updated.  Patient should feel much better after blood transfusion.  He may benefit from epogen.  Hemoccult negative stool    Hoy Morn, MD 11/24/13 [phone]

## 2013-11-24 NOTE — H&P (Signed)
[facility] History and Physical  Fabio Horath [id number] DOB: 11-04-1955 DOA: 11/23/2013  Referring physician: EDP PCP: Pcp Not In System   Chief Complaint: Fatigue   HPI: Patrick [name] is a 58 y.o. male who presents to the ED for nausea, generalized weakness, vomiting x3 today.  He has a complex medical history including kidney transplant which unfortunatly went into rejection last month with creatinine as high as 8 and he had to be admitted and managed at [facility] (their transplant team manages his transplant normally).  After increasing his anti rejection medications his creatinine has improved and trended down to 4.8 today.  Review of Systems: Denies blood in vomit, no melena, no hematochezia, Systems reviewed.  As above, otherwise negative  Past Medical History  Diagnosis Date  . Hypertension   . Diabetes mellitus without complication   . End-stage renal failure with renal transplant   . Gout   . Impotence of organic origin   . Type I (juvenile type) diabetes mellitus with renal manifestations, not stated as uncontrolled   . Secondary hyperparathyroidism    Past Surgical History  Procedure Laterality Date  . Birth defect heart surgery  1996  . Kidney transplant  2009   Social History:  reports that he has never smoked. He does not have any smokeless tobacco history on file. He reports that he does not drink alcohol. His drug history is not on file.  Allergies  Allergen Reactions  . Iodinated Diagnostic Agents Rash    Other Reaction: burning to mouth, swelling of lips.    No family history on file.   Prior to Admission medications   Medication Sig Start Date End Date Taking? Authorizing Provider  amLODipine (NORVASC) 2.5 MG tablet Take 2.5 mg by mouth daily.   Yes Historical Provider, MD  aspirin 81 MG tablet Take 81 mg by mouth daily.   Yes Historical Provider, MD  carvedilol (COREG) 25 MG tablet Take 25 mg by mouth 2 (two) times daily with a meal.   Yes  Historical Provider, MD  insulin lispro (HUMALOG) 100 UNIT/ML KiwkPen Inject 7 Units into the skin 3 (three) times daily.  09/06/13  Yes Renato Shin, MD  mycophenolate (CELLCEPT) 250 MG capsule Take 750 mg by mouth 3 (three) times daily.    Yes Historical Provider, MD  predniSONE (DELTASONE) 5 MG tablet Take 5 mg by mouth daily.   Yes Historical Provider, MD  tacrolimus (PROGRAF) 1 MG capsule Take 2 mg by mouth 2 (two) times daily.   Yes Historical Provider, MD  tacrolimus (PROGRAF) 5 MG capsule Take 5 mg by mouth 2 (two) times daily.   Yes Historical Provider, MD  Insulin Detemir (LEVEMIR) 100 UNIT/ML Pen Inject 5 Units into the skin at bedtime.  09/06/13   Renato Shin, MD   Physical Exam: Filed Vitals:   11/24/13 0223  BP: 120/54  Pulse: 73  Temp: 99.7 F (37.6 C)  Resp: 16    BP 120/54  Pulse 73  Temp(Src) 99.7 F (37.6 C) (Oral)  Resp 16  SpO2 100%  General Appearance:    Alert, oriented, no distress, appears stated age  Head:    Normocephalic, atraumatic  Eyes:    PERRL, EOMI, sclera non-icteric        Nose:   Nares without drainage or epistaxis. Mucosa, turbinates normal  Throat:   Moist mucous membranes. Oropharynx without erythema or exudate.  Neck:   Supple. No carotid bruits.  No thyromegaly.  No lymphadenopathy.   Back:  No CVA tenderness, no spinal tenderness  Lungs:     Clear to auscultation bilaterally, without wheezes, rhonchi or rales  Chest wall:    No tenderness to palpitation  Heart:    Regular rate and rhythm without murmurs, gallops, rubs  Abdomen:     Soft, non-tender, nondistended, normal bowel sounds, no organomegaly  Genitalia:    deferred  Rectal:    deferred  Extremities:   No clubbing, cyanosis or edema.  Pulses:   2+ and symmetric all extremities  Skin:   Skin color, texture, turgor normal, no rashes or lesions  Lymph nodes:   Cervical, supraclavicular, and axillary nodes normal  Neurologic:   CNII-XII intact. Normal strength, sensation and  reflexes      throughout    Labs on Admission:  Basic Metabolic Panel:  Recent Labs Lab 11/23/13 2313  NA 141  K 3.5*  CL 110  CO2 10*  GLUCOSE 96  BUN 57*  CREATININE 4.81*  CALCIUM 5.4*   Liver Function Tests:  Recent Labs Lab 11/23/13 2313  AST 8  ALT 6  ALKPHOS 57  BILITOT 0.3  PROT 8.1  ALBUMIN 3.9   No results found for this basename: LIPASE, AMYLASE,  in the last 168 hours No results found for this basename: AMMONIA,  in the last 168 hours CBC:  Recent Labs Lab 11/23/13 2313  WBC 7.4  NEUTROABS 5.9  HGB 6.2*  HCT 18.6*  MCV 82.7  PLT 184   Cardiac Enzymes: No results found for this basename: CKTOTAL, CKMB, CKMBINDEX, TROPONINI,  in the last 168 hours  BNP (last 3 results) No results found for this basename: PROBNP,  in the last 8760 hours CBG: No results found for this basename: GLUCAP,  in the last 168 hours  Radiological Exams on Admission: No results found.  EKG: Independently reviewed.  Assessment/Plan Active Problems:   End-stage renal failure with renal transplant   Type I (juvenile type) diabetes mellitus with renal manifestations, not stated as uncontrolled   Anemia   Renal transplant rejection   Hypocalcemia   Low bicarbonate   1. Symptomatic anemia - HGB 6.2 in ED, suspect this is due to CKD and his transplanted kidney being unable to produce a sufficient amount of erythrocyte CSF due to its decline in function after the rejection reaction last month. 2. ESRD with renal transplant, transplant rejection reaction last month - appears to be improved somewhat, creatinine of 4.8 which is certianly better than the 8+ he had during his [facility] stay or even the 5 that he was discharged with; however, renal function is clearly quite marginal, and he is, in addition to the anemia above, having multiple electrolyte abnormalities: 1. Hypocalcemia - replacing, rechecking BMP in AM, likely will require additional calcium, also checking  phosphorous as well. 2. Low bicarb and anion gap metabolic acidosis - anion gap 21 have patient on bicarb gtt at this time, recheck this level in AM, he was supposed to be taking 650mg  bicarb tabs at home 4 times a day but stopped this because "they made him not feel well". 3. DM1 - continue home meds for now, CBG checks AC/HS    Code Status: Full  Family Communication: Wife at bedside Disposition Plan: Admit to inpatient   Time spent: 15 min  [facility] Pager [phone]  If 7AM-7PM, please contact the day team taking care of the patient [email or web address] Password [email or web address] 11/24/2013, 2:29 AM

## 2013-11-24 NOTE — ED Notes (Signed)
Patient arrives with complaint of weakness and lethargy starting earlier this week. NAD, No other complaints.

## 2013-11-24 NOTE — Consult Note (Signed)
Referring Provider: No ref. 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 He has some early rejection in 10/22/06. But relatively stable renal function since although about 2 months ago he was hospitalized for Acute rejection that was treated with high dose steroids. Apparently his creatinine increased to 8, but is significantly better. He has had some nausea and vomiting a general malaise for several days. In the ER he was found to have a bicarbonate of 10 and is on a bicarbonate of 10. He is also fairly anemic and this appears lower than I would anticipate for CKD   Past Medical History  Diagnosis Date  . Hypertension   . Diabetes mellitus without complication   . End-stage renal failure with renal transplant   . Gout   . Impotence of organic origin   . Type I (juvenile type) diabetes mellitus with renal manifestations, not stated as uncontrolled   . Secondary hyperparathyroidism     Past Surgical History  Procedure Laterality Date  . 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 Kidney transplant  22-Oct-2007  . Appendectomy      Prior to Admission medications   Medication Sig Start Date End Date Taking? Authorizing Provider  amLODipine (NORVASC) 2.5 MG tablet Take 2.5 mg by mouth daily.   Yes Historical Provider, MD  aspirin 81 MG tablet Take 81 mg by mouth daily.   Yes Historical Provider, MD  carvedilol (COREG) 25 MG tablet Take 25 mg by mouth 2 (two) times daily with a meal.   Yes Historical Provider, MD  insulin lispro (HUMALOG) 100 UNIT/ML KiwkPen Inject 7 Units into the skin 3 (three) times daily.  09/06/13  Yes Renato Shin, MD  mycophenolate (CELLCEPT) 250 MG  capsule Take 750 mg by mouth 2 (two) times daily.    Yes Historical Provider, MD  predniSONE (DELTASONE) 5 MG tablet Take 5 mg by mouth daily.   Yes Historical Provider, MD  tacrolimus (PROGRAF) 1 MG capsule Take 2 mg by mouth 2 (two) times daily.   Yes Historical Provider, MD  tacrolimus (PROGRAF) 5 MG capsule Take 5 mg by mouth 2 (two) times daily.   Yes Historical Provider, MD  Insulin Detemir (LEVEMIR) 100 UNIT/ML Pen Inject 5 Units into the skin at bedtime.  09/06/13   Renato Shin, MD    Current Facility-Administered Medications  Medication Dose Route Frequency Provider Last Rate Last Dose  . amLODipine (NORVASC) tablet 2.5 mg  2.5 mg Oral Daily Etta Quill, DO   2.5 mg at 11/24/13 1010  . aspirin chewable tablet 81 mg  81 mg Oral Daily Etta Quill, DO   81 mg at 11/24/13 1010  . carvedilol (COREG) tablet 25 mg  25 mg Oral BID WC Etta Quill, DO   25 mg at 11/24/13 0758  . heparin injection 5,000 Units  5,000 Units Subcutaneous 3 times per day Etta Quill, DO   5,000 Units at 11/24/13 1358  . insulin aspart (novoLOG) injection 0-5 Units  0-5 Units Subcutaneous QHS Nishant Dhungel, MD      . insulin aspart (novoLOG) injection 0-9 Units  0-9 Units Subcutaneous TID WC Nishant Dhungel, MD      .  insulin aspart (novoLOG) injection 7 Units  7 Units Subcutaneous TID WC Etta Quill, DO   7 Units at 11/24/13 1306  . insulin detemir (LEVEMIR) injection 5 Units  5 Units Subcutaneous QHS Etta Quill, DO      . mycophenolate (CELLCEPT) capsule 750 mg  750 mg Oral BID Etta Quill, DO   750 mg at 11/24/13 1157  . predniSONE (DELTASONE) tablet 5 mg  5 mg Oral Daily Etta Quill, DO   5 mg at 11/24/13 1010  . sodium chloride 0.9 % injection 3 mL  3 mL Intravenous Q12H Etta Quill, DO   3 mL at 11/24/13 [id number]  . tacrolimus (PROGRAF) capsule 7 mg  7 mg Oral BID Etta Quill, DO   7 mg at 11/24/13 [id number]    Allergies as of 11/23/2013 - Review Complete 11/23/2013  Allergen  Reaction Noted  . Iodinated diagnostic agents Rash 10/18/2013    History reviewed. No pertinent family history.  History   Social History  . Marital Status: Married    Spouse Name: N/A    Number of Children: N/A  . Years of Education: N/A   Occupational History  . Not on file.   Social History Main Topics  . Smoking status: Never Smoker   . Smokeless tobacco: Not on file  . Alcohol Use: No  . Drug Use: No  . Sexual Activity: Not Currently    Birth Control/ Protection: None   Other Topics Concern  . Not on file   Social History Narrative  . No narrative on file    Review of Systems: Gen: Weak and general malaise HEENT: No visual complaints, Retinopathy CV: Denies chest pain, angina, palpitations, syncope, orthopnea, PND, peripheral edema, and claudication. Resp: Denies dyspnea at rest, dyspnea with exercise, cough, sputum, wheezing, coughing up blood, and pleurisy. GI: Nausea and vomiting GU : Denies urinary burning, blood in urine, urinary frequency, urinary hesitancy, nocturnal urination, and urinary incontinence.  No renal calculi. MS: Denies joint pain, limitation of movement, and swelling, stiffness, low back pain, extremity pain. Denies muscle weakness, cramps, atrophy.  No use of non steroidal antiinflammatory drugs. Derm: Denies rash, itching, dry skin, hives, moles, warts, or unhealing ulcers.  Psych: low spirits Heme: Denies bruising, bleeding, and enlarged lymph nodes. Neuro: No headache.  No diplopia. No dysarthria.  No dysphasia.  No history of CVA.  No Seizures. No paresthesias.  No weakness. Endocrine Diabetes  No Thyroid disease.  No Adrenal disease.  Physical Exam: Vital signs in last 24 hours: Temp:  [97.9 F (36.6 C)-99.7 F (37.6 C)] 98.6 F (37 C) (04/30 1411) Pulse Rate:  [62-93] 67 (04/30 1411) Resp:  [15-22] 18 (04/30 1411) BP: (90-151)/(34-86) 97/43 mmHg (04/30 1411) SpO2:  [99 %-100 %] 100 % (04/30 1411) Weight:  [95.4 kg (210 lb 5.1  oz)] 95.4 kg (210 lb 5.1 oz) (04/30 0330) Last BM Date: 11/24/13 General:   Alert,  Cushinoid appearance Head:  Normocephalic and atraumatic. Eyes:  Sclera clear, no icterus.   Conjunctiva pink. Ears:  Normal auditory acuity. Nose:  No deformity, discharge,  or lesions. Mouth:  No deformity or lesions, dentition normal. Neck:  Supple; no masses or thyromegaly. JVP not elevated Lungs:  Clear throughout to auscultation.   No wheezes, crackles, or rhonchi. No acute distress. Heart:  Regular rate and rhythm; no murmurs, clicks, rubs,  or gallops. Abdomen:  Soft, nontender and nondistended. No masses, hepatosplenomegaly or hernias noted. Normal bowel sounds, without  guarding, and without rebound.   Msk:  Symmetrical without gross deformities. Normal posture. Left AVG  No thrill + bruit Pulses:  No carotid, renal, femoral bruits. DP and PT symmetrical and equal Extremities:  Without clubbing or edema. Neurologic:  Alert and  oriented x4;  grossly normal neurologically. Skin:  Intact without significant lesions or rashes. Cervical Nodes:  No significant cervical adenopathy. Psych:  Alert and cooperative. Normal mood and affect.  Intake/Output from previous day: 04/29 0701 - 04/30 0700 In: 683 [Blood:683] Out: -  Intake/Output this shift: Total I/O In: -  Out: 400 [Urine:400]  Lab Results:  Recent Labs  11/23/13 2313 11/24/13 1022  WBC 7.4  --   HGB 6.2* 7.0*  HCT 18.6* 20.9*  PLT 184  --    BMET  Recent Labs  11/23/13 2313 11/24/13 1022  NA 141 141  K 3.5* 3.4*  CL 110 111  CO2 10* 10*  GLUCOSE 96 100*  BUN 57* 54*  CREATININE 4.81* 4.49*  CALCIUM 5.4* 5.3*  PHOS  --  4.1   LFT  Recent Labs  11/23/13 2313  PROT 8.1  ALBUMIN 3.9  AST 8  ALT 6  ALKPHOS 57  BILITOT 0.3   PT/INR No results found for this basename: LABPROT, INR,  in the last 72 hours Hepatitis Panel No results found for this basename: HEPBSAG, HCVAB, HEPAIGM, HEPBIGM,  in the last 72  hours  Studies/Results: No results found.  Assessment/Plan:  Stage 5  Chronic renal insufficiency   S/p renal transplant  2008   Worsening creatinine secondary to acute vascular rejection. Was to establish with nephrologist in [place] area due to dialysis requirements in the near future  Metabolic acidosis  Curious   Finding  High anion gap  May be renal failure or lactate acidosis. Possibilty of mesenteric ischemia  But no pain and non tender abdomen . I agree with the current plan of IV bicarbonate  Anemia with normal iron sudies and normal reticulocyte count . This would suggest  Anemia of Chronic disease  Transplant  Follow renal panel and check prograf ( tacrolimus ) levels  Establish care with Nephrologist locally  AVG will need out patient fistulogram when seen in clinic   LOS: Seba Dalkai @TODAY @3 :36 PM

## 2013-11-24 NOTE — Progress Notes (Signed)
UR completed.  Janiqua Friscia, RN BSN MHA CCM Trauma/Neuro ICU Case Manager [phone]  

## 2013-11-24 NOTE — Progress Notes (Signed)
[facility] PROGRESS NOTE  Patrick [name] [id number] DOB: 1955-10-11 DOA: 11/23/2013 PCP: Pcp Not In System   Brief narrative 58 year old male with stage 5 CK D. 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 Patient also had uncontrolled diabetes recently which has been improved after insulin dose adjusted as outpatient. Patient has been evaluated at [facility] for transplant and has a left UE AV graft.  Patient on presentation was found to have significant anemia with an and Metabolic acidosis significantly low bicarbonate and hypokalemia.   Assessment/Plan: Anemia Likely secondary to chronic kidney disease. Iron  panel does not suggest iron deficiency.  Stool for occult blood negative. Patient status post 1 unit PRBC. Hemoglobin improved to 7 only. Will likely need Procrit injections. Monitor H&H closely.  Anion gap metabolic acidosis Possible in the setting of underlying kidney disease with associated dehydration. We'll check lactic acid level. Patient does not have any abdominal symptoms. He has been placed on bicarbonate drip and will monitor electrolytes closely. Renal consult on board and outpatient recommendations  Renal transplant  with rejection Creatinine of 4.8 on presentation. Check a Prograf level. UA negative for UTI. Continue CellCept, Prograf and low-dose prednisone. As per my discussion with his nephrologist Dr Sheran Luz at [facility] on 4/30 patient is planned to be  evaluated for renal transplant and recommended that he be established care with nephrology in [place] for  Potential need of dialysis.  Hypocalcemia Monitor on telemetry. He received 1 g calcium gluconate on presentation. Repeat calcium level still low at 5.3 PTH ordered  another dose of IV calcium gluconate. We'll magnesium noted which will be replaced.  Hypokalemia/ hypomagnesemia  Replenish  with KCL. Replace low  Magnesium (1.2)  Diabetes mellitus Hb A1c of 8.2. continue home dose insulin and sliding scale insulin.  Nu[place]a, vomiting and weakness Likely secondary to dehydration and possible underlying uremia. Monitor with fluids. If symptoms unimproved and has persistent metabolic acidosis may need CT of abdomen and pelvis to r/o mesenteric ischemia. Study however will be limited due to inability to use contrast.   Positive nuclear stress test in 03/15 Aspartic was from [place] patient had positive nuclear stress test with findings of moderate apical and periapical ischemia with an EF of 62% and septal dyskinesis. continue ASA. Will check lipid panel. Will place on low dose metoprolol once BP more stable. Patient will need cardiology consult once acute issue has resolved. He does not have any chest pain. casrdiac w/up (cardiac cath ) can be done as outpt.    Code Status: Full code Family Communication: Wife at bedside Disposition Plan: Home Once improved   Consultants:  [place] [facility]  Procedures:  none  Antibiotics:  none  HPI/Subjective: Patient seen and examined this morning. Admission H&P from this morning reviewed.  Objective: Filed Vitals:   11/24/13 1411  BP: 97/43  Pulse: 67  Temp: 98.6 F (37 C)  Resp: 18    Intake/Output Summary (Last 24 hours) at 11/24/13 1634 Last data filed at 11/24/13 0820  Gross per 24 hour  Intake    683 ml  Output    400 ml  Net    283 ml   Filed Weights   11/24/13 0330  Weight: 95.4 kg (210 lb 5.1 oz)    Exam:  General:  Middle aged obese  male lying in bed in no acute distress but appears fatigued  HEENT: Pallor present, moist oral mucosa  Chest: Clear to auscultation bilaterally, no added sounds  CVS: Normal S1 and S2, no murmurs or gallop  Abdomen: Midline surgical scar, soft,  nontender, nondistended, bowel sounds present  Extremities: Warm, no edema  CNS: AAO x3    Data Reviewed: Basic Metabolic Panel:  Recent Labs Lab 11/23/13 2313 11/24/13 1022  NA 141 141  K 3.5* 3.4*  CL 110 111  CO2 10* 10*  GLUCOSE 96 100*  BUN 57* 54*  CREATININE 4.81* 4.49*  CALCIUM 5.4* 5.3*  MG  --  1.2*  PHOS  --  4.1   Liver Function Tests:  Recent Labs Lab 11/23/13 2313  AST 8  ALT 6  ALKPHOS 57  BILITOT 0.3  PROT 8.1  ALBUMIN 3.9   No results found for this basename: LIPASE, AMYLASE,  in the last 168 hours No results found for this basename: AMMONIA,  in the last 168 hours CBC:  Recent Labs Lab 11/23/13 2313 11/24/13 1022  WBC 7.4  --   NEUTROABS 5.9  --   HGB 6.2* 7.0*  HCT 18.6* 20.9*  MCV 82.7  --   PLT 184  --    Cardiac Enzymes: No results found for this basename: CKTOTAL, CKMB, CKMBINDEX, TROPONINI,  in the last 168 hours BNP (last 3 results) No results found for this basename: PROBNP,  in the last 8760 hours CBG:  Recent Labs Lab 11/24/13 0753 11/24/13 1140  GLUCAP 86 103*    No results found for this or any previous visit (from the past 240 hour(s)).   Studies: No results found.  Scheduled Meds: . amLODipine  2.5 mg Oral Daily  . aspirin  81 mg Oral Daily  . carvedilol  25 mg Oral BID WC  . darbepoetin (ARANESP) injection - DIALYSIS  100 mcg Subcutaneous Once  . heparin  5,000 Units Subcutaneous 3 times per day  . insulin aspart  0-5 Units Subcutaneous QHS  . insulin aspart  0-9 Units Subcutaneous TID WC  . insulin aspart  7 Units Subcutaneous TID WC  . insulin detemir  5 Units Subcutaneous QHS  . mycophenolate  750 mg Oral BID  . predniSONE  5 mg Oral Daily  . sodium chloride  3 mL Intravenous Q12H  . tacrolimus  7 mg Oral BID   Continuous Infusions: .  sodium bicarbonate 150 mEq in sterile water 1000 mL infusion        Time spent: 25 minutes    Janeann Paisley  [facility] Pager [phone] If  7PM-7AM, please contact night-coverage at [email or web address], password [facility] 11/24/2013, 4:34 PM  LOS: 1 day

## 2013-11-25 DIAGNOSIS — D649 Anemia, unspecified: Secondary | ICD-10-CM | POA: Diagnosis not present

## 2013-11-25 DIAGNOSIS — N186 End stage renal disease: Secondary | ICD-10-CM | POA: Diagnosis not present

## 2013-11-25 DIAGNOSIS — N185 Chronic kidney disease, stage 5: Secondary | ICD-10-CM | POA: Diagnosis not present

## 2013-11-25 DIAGNOSIS — E872 Acidosis: Secondary | ICD-10-CM | POA: Diagnosis not present

## 2013-11-25 DIAGNOSIS — D631 Anemia in chronic kidney disease: Secondary | ICD-10-CM | POA: Diagnosis not present

## 2013-11-25 DIAGNOSIS — N039 Chronic nephritic syndrome with unspecified morphologic changes: Secondary | ICD-10-CM

## 2013-11-25 DIAGNOSIS — N189 Chronic kidney disease, unspecified: Secondary | ICD-10-CM

## 2013-11-25 DIAGNOSIS — E878 Other disorders of electrolyte and fluid balance, not elsewhere classified: Secondary | ICD-10-CM | POA: Diagnosis not present

## 2013-11-25 DIAGNOSIS — Z940 Kidney transplant status: Secondary | ICD-10-CM | POA: Diagnosis not present

## 2013-11-25 LAB — BASIC METABOLIC PANEL
BUN: 52 mg/dL — AB (ref 6–23)
BUN: 52 mg/dL — ABNORMAL HIGH (ref 6–23)
BUN: 49 mg/dL — ABNORMAL HIGH (ref 6–23)
CHLORIDE: 110 meq/L (ref 96–112)
CO2: 12 mEq/L — ABNORMAL LOW (ref 19–32)
CO2: 14 mEq/L — ABNORMAL LOW (ref 19–32)
CO2: 16 mEq/L — ABNORMAL LOW (ref 19–32)
Calcium: 5.6 mg/dL — CL (ref 8.4–10.5)
Calcium: 5.6 mg/dL — CL (ref 8.4–10.5)
Calcium: 5.5 mg/dL — CL (ref 8.4–10.5)
Chloride: 108 mEq/L (ref 96–112)
Chloride: 110 mEq/L (ref 96–112)
Creatinine, Ser: 4.67 mg/dL — ABNORMAL HIGH (ref 0.50–1.35)
Creatinine, Ser: 4.64 mg/dL — ABNORMAL HIGH (ref 0.50–1.35)
Creatinine, Ser: 4.7 mg/dL — ABNORMAL HIGH (ref 0.50–1.35)
GFR calc Af Amer: 14 mL/min — ABNORMAL LOW (ref >90)
GFR calc non Af Amer: 13 mL/min — ABNORMAL LOW (ref >90)
GFR, EST AFRICAN AMERICAN: 14 mL/min — AB (ref >90)
GFR, EST AFRICAN AMERICAN: 15 mL/min — AB (ref >90)
GFR, EST NON AFRICAN AMERICAN: 12 mL/min — AB (ref >90)
GFR, EST NON AFRICAN AMERICAN: 12 mL/min — AB (ref >90)
GLUCOSE: 95 mg/dL (ref 70–99)
Glucose, Bld: 113 mg/dL — ABNORMAL HIGH (ref 70–99)
Glucose, Bld: 110 mg/dL — ABNORMAL HIGH (ref 70–99)
POTASSIUM: 3.4 meq/L — AB (ref 3.7–5.3)
Potassium: 3.9 mEq/L (ref 3.7–5.3)
Potassium: 3 mEq/L — ABNORMAL LOW (ref 3.7–5.3)
SODIUM: 143 meq/L (ref 137–147)
SODIUM: 143 meq/L (ref 137–147)
Sodium: 140 mEq/L (ref 137–147)

## 2013-11-25 LAB — CBC
HCT: 18.9 % — ABNORMAL LOW (ref 39.0–52.0)
HEMOGLOBIN: 6.5 g/dL — AB (ref 13.0–17.0)
MCH: 27.9 pg (ref 26.0–34.0)
MCHC: 34.4 g/dL (ref 30.0–36.0)
MCV: 81.1 fL (ref 78.0–100.0)
Platelets: 127 10*3/uL — ABNORMAL LOW (ref 150–400)
RBC: 2.33 MIL/uL — ABNORMAL LOW (ref 4.22–5.81)
RDW: 17.7 % — AB (ref 11.5–15.5)
WBC: 4.6 10*3/uL (ref 4.0–10.5)

## 2013-11-25 LAB — GLUCOSE, CAPILLARY
GLUCOSE-CAPILLARY: 91 mg/dL (ref 70–99)
GLUCOSE-CAPILLARY: 106 mg/dL — AB (ref 70–99)
GLUCOSE-CAPILLARY: 161 mg/dL — AB (ref 70–99)
Glucose-Capillary: 85 mg/dL (ref 70–99)

## 2013-11-25 LAB — VITAMIN D 25 HYDROXY (VIT D DEFICIENCY, FRACTURES): VIT D 25 HYDROXY: 14 ng/mL — AB (ref 30–89)

## 2013-11-25 LAB — TACROLIMUS LEVEL: TACROLIMUS (FK506) - LABCORP: 6.7 ng/mL

## 2013-11-25 LAB — PREPARE RBC (CROSSMATCH)

## 2013-11-25 MED ORDER — MAGNESIUM OXIDE 400 (241.3 MG) MG PO TABS
200.0000 mg | ORAL_TABLET | Freq: Every day | ORAL | Status: DC
  Administered 2013-11-25 – 2013-11-29 (×5): 200 mg via ORAL
  Filled 2013-11-25 (×5): qty 0.5

## 2013-11-25 MED ORDER — POTASSIUM CHLORIDE CRYS ER 20 MEQ PO TBCR
40.0000 meq | EXTENDED_RELEASE_TABLET | Freq: Four times a day (QID) | ORAL | Status: AC
  Administered 2013-11-25 – 2013-11-26 (×2): 40 meq via ORAL
  Filled 2013-11-25 (×2): qty 2

## 2013-11-25 MED ORDER — SODIUM CHLORIDE 0.9 % IV SOLN
1.0000 g | Freq: Once | INTRAVENOUS | Status: AC
  Administered 2013-11-25: 1 g via INTRAVENOUS
  Filled 2013-11-25: qty 10

## 2013-11-25 NOTE — Progress Notes (Signed)
[facility] PROGRESS NOTE  Patrick [name] [id number] DOB: 1955/12/31 DOA: 11/23/2013 PCP: Pcp Not In System   Brief narrative 58 year old male with stage 5 CK D. 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 Patient also had uncontrolled diabetes recently which has been improved after insulin dose adjusted as outpatient. Patient has been evaluated at [facility] for transplant and has a left UE AV graft.  Patient on presentation was found to have significant anemia with an and Metabolic acidosis significantly low bicarbonate and hypokalemia.   Assessment/Plan: Anemia Likely secondary to chronic kidney disease. Iron  panel does not suggest iron deficiency.  Stool for occult blood negative.  Hg coming down to 6.5, will type and cross and transfuse 2 units of PRBC's Repeat CBC in am  Anion gap metabolic acidosis Possible in the setting of underlying kidney disease with associated dehydration. We'll check lactic acid level. Patient does not have any abdominal symptoms. He has been placed on bicarbonate drip and will monitor electrolytes closely. Renal consult on board and outpatient recommendations  Renal transplant  with rejection Creatinine of 4.8 on presentation. Check a Prograf level. UA negative for UTI. Continue CellCept, Prograf and low-dose prednisone. As per my discussion with his nephrologist Dr Sheran Luz at [facility] on 4/30 patient is planned to be  evaluated for renal transplant and recommended that he be established care with nephrology in  for  Potential need of dialysis.  Hypocalcemia Monitor on telemetry. He received 1 g calcium gluconate on presentation. Repeat calcium level still low at 5.3 PTH ordered another dose of IV calcium  gluconate. We'll magnesium noted which will be replaced. Administered IV calcium gluconate  Hypokalemia/ hypomagnesemia  Replenish  with KCL. Replace low  Magnesium (1.2)  Diabetes mellitus Hb A1c of 8.2. continue home dose insulin and sliding scale insulin.  Nuasea, vomiting and weakness Likely secondary to dehydration and possible underlying uremia. Monitor with fluids. If symptoms unimproved and has persistent metabolic acidosis may need CT of abdomen and pelvis to r/o mesenteric ischemia. Study however will be limited due to inability to use contrast.   Positive nuclear stress test in 03/15 Aspartic was from [place] patient had positive nuclear stress test with findings of moderate apical and periapical ischemia with an EF of 62% and septal dyskinesis. continue ASA. Will check lipid panel. Will place on low dose metoprolol once BP more stable. Patient will need cardiology consult once acute issue has resolved. He does not have any chest pain. casrdiac w/up (cardiac cath ) can be done as outpt.    Code Status: Full code Family Communication: Wife at bedside Disposition Plan: Home Once improved   Consultants:  [place]  Procedures:  none  Antibiotics:  none  HPI/Subjective: Patient states feeling today even though his Hg coming down to 6.5. Denies bloody stools, hematemesis, melena. He is tolerating PO intake.   Objective: Filed Vitals:   11/25/13 1827  BP: 132/60  Pulse: 65  Temp: 98.4 F (36.9 C)  Resp: 18    Intake/Output Summary (Last 24 hours) at 11/25/13 1842 Last data filed at 11/25/13 1827  Gross per 24 hour  Intake 1244.5 ml  Output    550 ml  Net  694.5 ml   Filed Weights   11/24/13 0330  Weight: 95.4 kg (210 lb 5.1 oz)    Exam:   General:  Middle aged obese  male lying in bed in no acute distress but appears fatigued  HEENT: Pallor present, moist oral mucosa  Chest: Clear to auscultation bilaterally, no added sounds  CVS: Normal S1  and S2, no murmurs or gallop  Abdomen: Midline surgical scar, soft, nontender, nondistended, bowel sounds present  Extremities: Warm, no edema  CNS: AAO x3    Data Reviewed: Basic Metabolic Panel:  Recent Labs Lab 11/23/13 2313 11/24/13 1022 11/24/13 1912 11/25/13 0133 11/25/13 0645 11/25/13 1130  NA 141 141 139 140 143 143  K 3.5* 3.4* 3.8 3.9 3.4* 3.0*  CL 110 111 110 108 110 110  CO2 10* 10* 11* 12* 14* 16*  GLUCOSE 96 100* 133* 113* 95 110*  BUN 57* 54* 51* 52* 52* 49*  CREATININE 4.81* 4.49* 4.73* 4.67* 4.64* 4.70*  CALCIUM 5.4* 5.3* 5.5* 5.6* 5.6* 5.5*  MG  --  1.2*  --   --   --   --   PHOS  --  4.1  --   --   --   --    Liver Function Tests:  Recent Labs Lab 11/23/13 2313  AST 8  ALT 6  ALKPHOS 57  BILITOT 0.3  PROT 8.1  ALBUMIN 3.9   No results found for this basename: LIPASE, AMYLASE,  in the last 168 hours No results found for this basename: AMMONIA,  in the last 168 hours CBC:  Recent Labs Lab 11/23/13 2313 11/24/13 1022 11/25/13 0645  WBC 7.4  --  4.6  NEUTROABS 5.9  --   --   HGB 6.2* 7.0* 6.5*  HCT 18.6* 20.9* 18.9*  MCV 82.7  --  81.1  PLT 184  --  127*   Cardiac Enzymes: No results found for this basename: CKTOTAL, CKMB, CKMBINDEX, TROPONINI,  in the last 168 hours BNP (last 3 results) No results found for this basename: PROBNP,  in the last 8760 hours CBG:  Recent Labs Lab 11/24/13 1648 11/24/13 2203 11/25/13 0802 11/25/13 1211 11/25/13 1700  GLUCAP 88 140* 91 106* 85    No results found for this or any previous visit (from the past 240 hour(s)).   Studies: No results found.  Scheduled Meds: . amLODipine  2.5 mg Oral Daily  . aspirin  81 mg Oral Daily  . carvedilol  25 mg Oral BID WC  . heparin  5,000 Units Subcutaneous 3 times per day  . insulin aspart  0-5 Units Subcutaneous QHS  . insulin aspart  0-9 Units Subcutaneous TID WC  . insulin aspart  7 Units Subcutaneous TID WC  . insulin detemir  5 Units  Subcutaneous QHS  . magnesium oxide  200 mg Oral Daily  . mycophenolate  750 mg Oral BID  . predniSONE  5 mg Oral Daily  . sodium chloride  3 mL Intravenous Q12H  . tacrolimus  7 mg Oral BID   Continuous Infusions: .  sodium bicarbonate 150 mEq in sterile water 1000 mL infusion 150 mEq (11/25/13 0636)      Time spent: 25 minutes    [facility] Pager [phone] If 7PM-7AM, please contact night-coverage at [email or web address], password [facility] 11/25/2013, 6:42 PM  LOS: 2 days

## 2013-11-25 NOTE — Progress Notes (Signed)
CRITICAL VALUE ALERT  Critical value received: Hemoglobin   Date of notification:  11/25/13  Time of notification:  0830  Critical value read back:yes  Nurse who received alert:  Mayra Neer  MD notified (1st page): Dr. Coralyn Pear  Time of first page:  0840  MD notified (2nd page):  Time of second page:  Responding MD:  Dr. Coralyn Pear  Time MD responded:  [phone]

## 2013-11-25 NOTE — Progress Notes (Signed)
[facility] ROUNDING NOTE   Subjective:   Interval History: no complaints this morning  Objective:  Vital signs in last 24 hours:  Temp:  [98.2 F (36.8 C)-98.6 F (37 C)] 98.3 F (36.8 C) (05/01 1104) Pulse Rate:  [60-67] 60 (05/01 1104) Resp:  [16-18] 16 (05/01 0549) BP: (95-114)/(43-55) 95/45 mmHg (05/01 1104) SpO2:  [100 %] 100 % (05/01 1104)  Weight change:  Filed Weights   11/24/13 0330  Weight: 95.4 kg (210 lb 5.1 oz)    Intake/Output: I/O last 3 completed shifts: In: 1255.5 [I.V.:462.5; Blood:683; IV Piggyback:110] Out: 950 [Urine:950]   Intake/Output this shift:    Periorbital edema CVS- RRR RS- CTA ABD- BS present soft non-distended EXT- no edema   Basic Metabolic Panel:  Recent Labs Lab 11/23/13 2313 11/24/13 1022 11/24/13 1912 11/25/13 0133 11/25/13 0645  NA 141 141 139 140 143  K 3.5* 3.4* 3.8 3.9 3.4*  CL 110 111 110 108 110  CO2 10* 10* 11* 12* 14*  GLUCOSE 96 100* 133* 113* 95  BUN 57* 54* 51* 52* 52*  CREATININE 4.81* 4.49* 4.73* 4.67* 4.64*  CALCIUM 5.4* 5.3* 5.5* 5.6* 5.6*  MG  --  1.2*  --   --   --   PHOS  --  4.1  --   --   --     Liver Function Tests:  Recent Labs Lab 11/23/13 2313  AST 8  ALT 6  ALKPHOS 57  BILITOT 0.3  PROT 8.1  ALBUMIN 3.9   No results found for this basename: LIPASE, AMYLASE,  in the last 168 hours No results found for this basename: AMMONIA,  in the last 168 hours  CBC:  Recent Labs Lab 11/23/13 2313 11/24/13 1022 11/25/13 0645  WBC 7.4  --  4.6  NEUTROABS 5.9  --   --   HGB 6.2* 7.0* 6.5*  HCT 18.6* 20.9* 18.9*  MCV 82.7  --  81.1  PLT 184  --  127*    Cardiac Enzymes: No results found for this basename: CKTOTAL, CKMB, CKMBINDEX, TROPONINI,  in the last 168 hours  BNP: No components found with this basename: POCBNP,   CBG:  Recent Labs Lab 11/24/13 0753 11/24/13 1140 11/24/13 1648 11/24/13 2203 11/25/13 0802  GLUCAP 86 103* 88 140* 91     Microbiology: No results found for this or any previous visit.  Coagulation Studies: No results found for this basename: LABPROT, INR,  in the last 72 hours  Urinalysis:  Recent Labs  11/24/13 0242  COLORURINE YELLOW  LABSPEC 1.012  PHURINE 6.5  GLUCOSEU NEGATIVE  HGBUR SMALL*  BILIRUBINUR NEGATIVE  KETONESUR NEGATIVE  PROTEINUR 30*  UROBILINOGEN 0.2  NITRITE NEGATIVE  LEUKOCYTESUR NEGATIVE      Imaging: No results found.   Medications:   .  sodium bicarbonate 150 mEq in sterile water 1000 mL infusion 150 mEq (11/25/13 0636)   . amLODipine  2.5 mg Oral Daily  . aspirin  81 mg Oral Daily  . calcium gluconate  1 g Intravenous Once  . carvedilol  25 mg Oral BID WC  . heparin  5,000 Units Subcutaneous 3 times per day  . insulin aspart  0-5 Units Subcutaneous QHS  . insulin aspart  0-9 Units Subcutaneous TID WC  . insulin aspart  7 Units Subcutaneous TID WC  . insulin detemir  5 Units Subcutaneous QHS  . mycophenolate  750 mg Oral BID  . predniSONE  5 mg Oral Daily  .  sodium chloride  3 mL Intravenous Q12H  . tacrolimus  7 mg Oral BID     Assessment/ Plan:   Stage 5 Chronic renal insufficiency S/p renal transplant 2008 Worsening creatinine secondary to acute vascular rejection. Was to establish with nephrologist in [place] area due to dialysis requirements in the near future  Metabolic acidosis Curious Finding High anion gap Lactate normal. Possibilty of mesenteric ischemia But no pain and non tender abdomen . I agree with the current plan of IV bicarbonate  Anemia with normal iron sudies and normal reticulocyte count . This would suggest Anemia of Chronic disease may transfuse 1 or 2 units Transplant Follow renal panel and prograf ( tacrolimus ) levels pending Establish care with Nephrologist locally  AVG will need out patient fistulogram when seen in clinic Metabolic acidosis improving slowly   Hypomagnesemia start magnesium oxide    LOS: 2 Sherril Croon @TODAY @11 :23 AM

## 2013-11-25 NOTE — Progress Notes (Signed)
CRITICAL VALUE ALERT  Critical value received:Calcium  Date of notification: 11/25/13  Time of notification: 0800  Critical value read back:Yes  Nurse who received alert:  Mayra Neer  MD notified (1st page):Dr. Coralyn Pear  Time of first page: 0840  MD notified (2nd page):  Time of second page:  Responding MD:  Dr. Coralyn Pear  Time MD responded:

## 2013-11-26 DIAGNOSIS — T8610 Unspecified complication of kidney transplant: Secondary | ICD-10-CM | POA: Diagnosis not present

## 2013-11-26 DIAGNOSIS — E872 Acidosis: Secondary | ICD-10-CM | POA: Diagnosis not present

## 2013-11-26 DIAGNOSIS — D631 Anemia in chronic kidney disease: Secondary | ICD-10-CM | POA: Diagnosis not present

## 2013-11-26 DIAGNOSIS — Z940 Kidney transplant status: Secondary | ICD-10-CM | POA: Diagnosis not present

## 2013-11-26 DIAGNOSIS — N186 End stage renal disease: Secondary | ICD-10-CM | POA: Diagnosis not present

## 2013-11-26 DIAGNOSIS — N185 Chronic kidney disease, stage 5: Secondary | ICD-10-CM | POA: Diagnosis not present

## 2013-11-26 LAB — GLUCOSE, CAPILLARY
GLUCOSE-CAPILLARY: 75 mg/dL (ref 70–99)
GLUCOSE-CAPILLARY: 149 mg/dL — AB (ref 70–99)
Glucose-Capillary: 89 mg/dL (ref 70–99)
Glucose-Capillary: 166 mg/dL — ABNORMAL HIGH (ref 70–99)

## 2013-11-26 LAB — BASIC METABOLIC PANEL
BUN: 49 mg/dL — ABNORMAL HIGH (ref 6–23)
CALCIUM: 5.4 mg/dL — AB (ref 8.4–10.5)
CO2: 18 meq/L — AB (ref 19–32)
Chloride: 111 mEq/L (ref 96–112)
Creatinine, Ser: 4.27 mg/dL — ABNORMAL HIGH (ref 0.50–1.35)
GFR calc Af Amer: 16 mL/min — ABNORMAL LOW (ref >90)
GFR, EST NON AFRICAN AMERICAN: 14 mL/min — AB (ref >90)
Glucose, Bld: 100 mg/dL — ABNORMAL HIGH (ref 70–99)
Potassium: 3.3 mEq/L — ABNORMAL LOW (ref 3.7–5.3)
SODIUM: 146 meq/L (ref 137–147)

## 2013-11-26 LAB — TYPE AND SCREEN
ABO/RH(D): O POS
ANTIBODY SCREEN: NEGATIVE
UNIT DIVISION: 0
Unit division: 0
Unit division: 0
Unit division: 0

## 2013-11-26 LAB — CBC
HCT: 24.5 % — ABNORMAL LOW (ref 39.0–52.0)
Hemoglobin: 8.5 g/dL — ABNORMAL LOW (ref 13.0–17.0)
MCH: 28.5 pg (ref 26.0–34.0)
MCHC: 34.7 g/dL (ref 30.0–36.0)
MCV: 82.2 fL (ref 78.0–100.0)
PLATELETS: 128 10*3/uL — AB (ref 150–400)
RBC: 2.98 MIL/uL — ABNORMAL LOW (ref 4.22–5.81)
RDW: 16.8 % — ABNORMAL HIGH (ref 11.5–15.5)
WBC: 7 10*3/uL (ref 4.0–10.5)

## 2013-11-26 LAB — MAGNESIUM: MAGNESIUM: 1.7 mg/dL (ref 1.5–2.5)

## 2013-11-26 MED ORDER — SODIUM CHLORIDE 0.9 % IV SOLN
1.0000 g | Freq: Once | INTRAVENOUS | Status: AC
  Administered 2013-11-26: 1 g via INTRAVENOUS
  Filled 2013-11-26 (×2): qty 10

## 2013-11-26 MED ORDER — MAGNESIUM SULFATE IN D5W 10-5 MG/ML-% IV SOLN
1.0000 g | Freq: Once | INTRAVENOUS | Status: AC
  Administered 2013-11-26: 1 g via INTRAVENOUS
  Filled 2013-11-26: qty 100

## 2013-11-26 MED ORDER — POTASSIUM CHLORIDE CRYS ER 20 MEQ PO TBCR
40.0000 meq | EXTENDED_RELEASE_TABLET | Freq: Once | ORAL | Status: AC
  Administered 2013-11-26: 40 meq via ORAL
  Filled 2013-11-26: qty 2

## 2013-11-26 MED ORDER — CALCIUM CARBONATE ANTACID 500 MG PO CHEW
2.0000 | CHEWABLE_TABLET | Freq: Three times a day (TID) | ORAL | Status: DC
  Administered 2013-11-26 – 2013-11-28 (×7): 400 mg via ORAL
  Filled 2013-11-26: qty 2
  Filled 2013-11-26 (×2): qty 1
  Filled 2013-11-26 (×4): qty 2
  Filled 2013-11-26 (×2): qty 1
  Filled 2013-11-26: qty 2

## 2013-11-26 MED ORDER — CALCITRIOL 0.25 MCG PO CAPS
0.2500 ug | ORAL_CAPSULE | Freq: Every day | ORAL | Status: DC
  Administered 2013-11-26 – 2013-11-28 (×3): 0.25 ug via ORAL
  Filled 2013-11-26 (×3): qty 1

## 2013-11-26 NOTE — Progress Notes (Signed)
Critical calcium of 5.4.  MD paged by night RN.  New orders received. Syliva Overman

## 2013-11-26 NOTE — Progress Notes (Signed)
BS of 75, crackers given and meal coverage insulin held. Will continue to monitor. Syliva Overman

## 2013-11-26 NOTE — Progress Notes (Signed)
[facility] ROUNDING NOTE   Subjective:   Interval History: no complaints today resting comfortably  Objective:  Vital signs in last 24 hours:  Temp:  [97.2 F (36.2 C)-98.8 F (37.1 C)] 98.8 F (37.1 C) (05/02 0826) Pulse Rate:  [57-72] 67 (05/02 0826) Resp:  [16-19] 16 (05/02 0826) BP: (95-143)/(45-72) 137/62 mmHg (05/02 0826) SpO2:  [99 %-100 %] 100 % (05/02 0826)  Weight change:  Filed Weights   11/24/13 0330  Weight: 95.4 kg (210 lb 5.1 oz)    Intake/Output: I/O last 3 completed shifts: In: 3734.5 [P.O.:240; I.V.:2712.5; Blood:672; IV Piggyback:110] Out: 850 [Urine:850]   Intake/Output this shift:  Total I/O In: -  Out: 400 [Urine:400]  CVS- RRR RS- CTA ABD- BS present soft non-distended EXT- no edema   Basic Metabolic Panel:  Recent Labs Lab 11/23/13 2313 11/24/13 1022 11/24/13 1912 11/25/13 0133 11/25/13 0645 11/25/13 1130 11/26/13 0550  NA 141 141 139 140 143 143 146  K 3.5* 3.4* 3.8 3.9 3.4* 3.0* 3.3*  CL 110 111 110 108 110 110 111  CO2 10* 10* 11* 12* 14* 16* 18*  GLUCOSE 96 100* 133* 113* 95 110* 100*  BUN 57* 54* 51* 52* 52* 49* 49*  CREATININE 4.81* 4.49* 4.73* 4.67* 4.64* 4.70* 4.27*  CALCIUM 5.4* 5.3* 5.5* 5.6* 5.6* 5.5* 5.4*  MG  --  1.2*  --   --   --   --  1.7  PHOS  --  4.1  --   --   --   --   --     Liver Function Tests:  Recent Labs Lab 11/23/13 2313  AST 8  ALT 6  ALKPHOS 57  BILITOT 0.3  PROT 8.1  ALBUMIN 3.9   No results found for this basename: LIPASE, AMYLASE,  in the last 168 hours No results found for this basename: AMMONIA,  in the last 168 hours  CBC:  Recent Labs Lab 11/23/13 2313 11/24/13 1022 11/25/13 0645 11/26/13 0550  WBC 7.4  --  4.6 7.0  NEUTROABS 5.9  --   --   --   HGB 6.2* 7.0* 6.5* 8.5*  HCT 18.6* 20.9* 18.9* 24.5*  MCV 82.7  --  81.1 82.2  PLT 184  --  127* 128*    Cardiac Enzymes: No results found for this basename: CKTOTAL, CKMB, CKMBINDEX, TROPONINI,  in the  last 168 hours  BNP: No components found with this basename: POCBNP,   CBG:  Recent Labs Lab 11/25/13 0802 11/25/13 1211 11/25/13 1700 11/25/13 2214 11/26/13 0800  GLUCAP 91 106* 85 161* 25    Microbiology: No results found for this or any previous visit.  Coagulation Studies: No results found for this basename: LABPROT, INR,  in the last 72 hours  Urinalysis:  Recent Labs  11/24/13 0242  COLORURINE YELLOW  LABSPEC 1.012  PHURINE 6.5  GLUCOSEU NEGATIVE  HGBUR SMALL*  BILIRUBINUR NEGATIVE  KETONESUR NEGATIVE  PROTEINUR 30*  UROBILINOGEN 0.2  NITRITE NEGATIVE  LEUKOCYTESUR NEGATIVE      Imaging: No results found.   Medications:   .  sodium bicarbonate 150 mEq in sterile water 1000 mL infusion 150 mEq (11/26/13 0130)   . amLODipine  2.5 mg Oral Daily  . aspirin  81 mg Oral Daily  . calcitRIOL  0.25 mcg Oral Daily  . calcium carbonate  2 tablet Oral TID  . carvedilol  25 mg Oral BID WC  . heparin  5,000 Units Subcutaneous 3 times per day  .  insulin aspart  0-5 Units Subcutaneous QHS  . insulin aspart  0-9 Units Subcutaneous TID WC  . insulin aspart  7 Units Subcutaneous TID WC  . insulin detemir  5 Units Subcutaneous QHS  . magnesium oxide  200 mg Oral Daily  . mycophenolate  750 mg Oral BID  . predniSONE  5 mg Oral Daily  . sodium chloride  3 mL Intravenous Q12H  . tacrolimus  7 mg Oral BID     Assessment/ Plan:   Assessment/ Plan:   Stage 5 Chronic renal insufficiency S/p renal transplant 2008 Worsening creatinine secondary to acute vascular rejection. Was to establish with nephrologist in [place] area due to dialysis requirements in the near future  Anemia with normal iron sudies and normal reticulocyte count . Hb 8.5 after transfusion  Transplant Follow renal panel and prograf ( tacrolimus ) levels 6.7 Establish care with Nephrologist locally  AVG will need out patient fistulogram when seen in clinic  Metabolic acidosis improving change  to oral bicarbonate Hypomagnesemia start magnesium oxide Hypocalcemia will give calcium and vitamin D     LOS: 3 Sherril Croon @TODAY @9 :41 AM

## 2013-11-26 NOTE — Progress Notes (Addendum)
CRITICAL VALUE ALERT  Critical value received:  5.4 calcium  Date of notification:  11/26/2013  Time of notification:  0720  Critical value read back:yes  Nurse who received alert:  Ventura Bruns   MD notified (1st page):  [place]  Time of first page:  0725  MD notified (2nd page):  Time of second page:  Responding MD:  report given to next shift nurse, see orders, progress notes  Time MD responded:  Unknown, pls see orders

## 2013-11-26 NOTE — Progress Notes (Addendum)
[facility] PROGRESS NOTE  Patrick [name] [id number] DOB: 03/08/1956 DOA: 11/23/2013 PCP: Pcp Not In System   Brief narrative 58 year old male with stage 5 CK D. 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 Patient also had uncontrolled diabetes recently which has been improved after insulin dose adjusted as outpatient. Patient has been evaluated at [facility] for transplant and has a left UE AV graft.  Patient on presentation was found to have significant anemia with an and Metabolic acidosis significantly low bicarbonate and hypokalemia.   Assessment/Plan: Anemia Likely secondary to chronic kidney disease. Iron  panel does not suggest iron deficiency.  Stool for occult blood negative.  Patient received 2 units PRBC yesterday the hemoglobin was unable to 6.5. Improved 8.5 this morning.  Anion gap metabolic acidosis Possible in the setting of underlying kidney disease with associated dehydration. Has normal lactic acid level. Patient does not have any abdominal symptoms. He has been placed on bicarbonate drip and levels slowly improving. -Appreciate renal recommendations. Low  vitamin D level. Started on calcitriol. Has persistent low calcium level and getting IV calcium gluconate. Started on calcium carbonate tablets 3 times a day. Monitor BMET Q 12 hrs  Renal transplant  with rejection Creatinine of 4.8 on presentation.  Prograf level therapeutic. UA negative for UTI. Continue CellCept, Prograf and low-dose prednisone. -Renal function slowly improving. As per my discussion with his nephrologist Dr Sheran Luz at [facility] on 4/30 patient is planned to be  evaluated for renal transplant and recommended that he be established care with nephrology in  [place] for potential need of dialysis.  Hypocalcemia Monitor on telemetry. Calcium level persistently low and has been getting IV calcium gluconate supplementation with magnesium oxide and IV magnesium sulfate. Clinically stable. Monitor with repeat labs. Start her on calcium carbonate tablets and just atrial portal vitamin D levels.   Hypokalemia/ hypomagnesemia  Replenished  Diabetes mellitus Hb A1c of 8.2. continue home dose insulin and sliding scale insulin.  Nuasea, vomiting and weakness Likely secondary to dehydration and possible underlying uremia. Clinically improving now.   Positive nuclear stress test in 03/15 As per results from [place],  patient had positive nuclear stress test with findings of moderate apical and periapical ischemia with an EF of 62% and septal dyskinesis. continue ASA. Will check lipid panel. Will place on low dose metoprolol once BP more stable. Patient will need cardiology consult once acute issue has resolved. He does not have any chest pain. casrdiac w/up (cardiac cath ) can be done as outpt.    Code Status: Full code Family Communication: none at bedside Disposition Plan: Possibly discharge home tomorrow if continues to improve   Consultants:  [place] kidney  Procedures:  none  Antibiotics:  none  HPI/Subjective: Patient seen and examined this morning. Informs feeling much better today  Objective: Filed Vitals:   11/26/13 1426  BP: 145/77  Pulse: 57  Temp: 98.6 F (37 C)  Resp: 18    Intake/Output Summary (Last 24 hours) at 11/26/13 1549 Last data filed at 11/26/13 1426  Gross per 24 hour  Intake   2828 ml  Output   1675 ml  Net   1153 ml   Filed Weights   11/24/13 0330  Weight: 95.4 kg (210 lb 5.1 oz)  Exam:   General:  Middle aged obese  male lying in bed in no acute distress   HEENT: Pallor present, moist oral mucosa  Chest: Clear to auscultation bilaterally, no added sounds  CVS: Normal S1 and S2, no  murmurs or gallop  Abdomen: Midline surgical scar, soft, nontender, nondistended, bowel sounds present  Extremities: Warm, no edema  CNS: AAO x3    Data Reviewed: Basic Metabolic Panel:  Recent Labs Lab 11/23/13 2313 11/24/13 1022 11/24/13 1912 11/25/13 0133 11/25/13 0645 11/25/13 1130 11/26/13 0550  NA 141 141 139 140 143 143 146  K 3.5* 3.4* 3.8 3.9 3.4* 3.0* 3.3*  CL 110 111 110 108 110 110 111  CO2 10* 10* 11* 12* 14* 16* 18*  GLUCOSE 96 100* 133* 113* 95 110* 100*  BUN 57* 54* 51* 52* 52* 49* 49*  CREATININE 4.81* 4.49* 4.73* 4.67* 4.64* 4.70* 4.27*  CALCIUM 5.4* 5.3* 5.5* 5.6* 5.6* 5.5* 5.4*  MG  --  1.2*  --   --   --   --  1.7  PHOS  --  4.1  --   --   --   --   --    Liver Function Tests:  Recent Labs Lab 11/23/13 2313  AST 8  ALT 6  ALKPHOS 57  BILITOT 0.3  PROT 8.1  ALBUMIN 3.9   No results found for this basename: LIPASE, AMYLASE,  in the last 168 hours No results found for this basename: AMMONIA,  in the last 168 hours CBC:  Recent Labs Lab 11/23/13 2313 11/24/13 1022 11/25/13 0645 11/26/13 0550  WBC 7.4  --  4.6 7.0  NEUTROABS 5.9  --   --   --   HGB 6.2* 7.0* 6.5* 8.5*  HCT 18.6* 20.9* 18.9* 24.5*  MCV 82.7  --  81.1 82.2  PLT 184  --  127* 128*   Cardiac Enzymes: No results found for this basename: CKTOTAL, CKMB, CKMBINDEX, TROPONINI,  in the last 168 hours BNP (last 3 results) No results found for this basename: PROBNP,  in the last 8760 hours CBG:  Recent Labs Lab 11/25/13 1211 11/25/13 1700 11/25/13 2214 11/26/13 0800 11/26/13 1144  GLUCAP 106* 85 161* 89 75    No results found for this or any previous visit (from the past 240 hour(s)).   Studies: No results found.  Scheduled Meds: . amLODipine  2.5 mg Oral Daily  . aspirin  81 mg Oral Daily  . calcitRIOL  0.25 mcg Oral Daily  . calcium carbonate  2 tablet Oral TID  . carvedilol  25 mg Oral BID WC  . heparin  5,000 Units Subcutaneous 3 times per day  .  insulin aspart  0-5 Units Subcutaneous QHS  . insulin aspart  0-9 Units Subcutaneous TID WC  . insulin aspart  7 Units Subcutaneous TID WC  . insulin detemir  5 Units Subcutaneous QHS  . magnesium oxide  200 mg Oral Daily  . magnesium sulfate 1 - 4 g bolus IVPB  1 g Intravenous Once  . mycophenolate  750 mg Oral BID  . potassium chloride  40 mEq Oral Once  . predniSONE  5 mg Oral Daily  . sodium chloride  3 mL Intravenous Q12H  . tacrolimus  7 mg Oral BID   Continuous Infusions: .  sodium bicarbonate 150 mEq in sterile water 1000 mL infusion 150 mEq (11/26/13 1519)      Time spent: 25 minutes    Kuzey Ogata  [facility] Pager [phone] If 7PM-7AM, please contact night-coverage at [email or web address], password [facility] 11/26/2013, 3:49 PM  LOS: 3 days

## 2013-11-26 NOTE — Progress Notes (Signed)
Pt requested to "be off" the IV for a little bit.  That it keeps beeping and making noise.  I disconnected pt with the explanation that it will be started infusing only after a very short time.  Will report this to next shift.  Pt states he will discuss with the MD to be finished with IV.

## 2013-11-27 DIAGNOSIS — D631 Anemia in chronic kidney disease: Secondary | ICD-10-CM | POA: Diagnosis not present

## 2013-11-27 DIAGNOSIS — N186 End stage renal disease: Secondary | ICD-10-CM | POA: Diagnosis not present

## 2013-11-27 DIAGNOSIS — N189 Chronic kidney disease, unspecified: Secondary | ICD-10-CM | POA: Diagnosis not present

## 2013-11-27 LAB — BASIC METABOLIC PANEL
BUN: 41 mg/dL — AB (ref 6–23)
CO2: 24 mEq/L (ref 19–32)
CREATININE: 3.71 mg/dL — AB (ref 0.50–1.35)
Calcium: 5.6 mg/dL — CL (ref 8.4–10.5)
Chloride: 105 mEq/L (ref 96–112)
GFR, EST AFRICAN AMERICAN: 19 mL/min — AB (ref >90)
GFR, EST NON AFRICAN AMERICAN: 16 mL/min — AB (ref >90)
Glucose, Bld: 94 mg/dL (ref 70–99)
Potassium: 3.2 mEq/L — ABNORMAL LOW (ref 3.7–5.3)
Sodium: 145 mEq/L (ref 137–147)

## 2013-11-27 LAB — GLUCOSE, CAPILLARY
GLUCOSE-CAPILLARY: 89 mg/dL (ref 70–99)
GLUCOSE-CAPILLARY: 135 mg/dL — AB (ref 70–99)
GLUCOSE-CAPILLARY: 152 mg/dL — AB (ref 70–99)
GLUCOSE-CAPILLARY: 149 mg/dL — AB (ref 70–99)
Glucose-Capillary: 102 mg/dL — ABNORMAL HIGH (ref 70–99)

## 2013-11-27 LAB — CBC
HEMATOCRIT: 25.4 % — AB (ref 39.0–52.0)
Hemoglobin: 8.7 g/dL — ABNORMAL LOW (ref 13.0–17.0)
MCH: 28.5 pg (ref 26.0–34.0)
MCHC: 34.3 g/dL (ref 30.0–36.0)
MCV: 83.3 fL (ref 78.0–100.0)
PLATELETS: 129 10*3/uL — AB (ref 150–400)
RBC: 3.05 MIL/uL — ABNORMAL LOW (ref 4.22–5.81)
RDW: 16.9 % — AB (ref 11.5–15.5)
WBC: 6.7 10*3/uL (ref 4.0–10.5)

## 2013-11-27 LAB — MAGNESIUM: MAGNESIUM: 1.6 mg/dL (ref 1.5–2.5)

## 2013-11-27 MED ORDER — MAGNESIUM SULFATE 40 MG/ML IJ SOLN
2.0000 g | Freq: Once | INTRAMUSCULAR | Status: AC
  Administered 2013-11-27: 2 g via INTRAVENOUS
  Filled 2013-11-27: qty 50

## 2013-11-27 MED ORDER — SODIUM CHLORIDE 0.9 % IV SOLN
1.0000 g | Freq: Once | INTRAVENOUS | Status: AC
  Administered 2013-11-27: 1 g via INTRAVENOUS
  Filled 2013-11-27: qty 10

## 2013-11-27 MED ORDER — POTASSIUM CHLORIDE CRYS ER 20 MEQ PO TBCR
40.0000 meq | EXTENDED_RELEASE_TABLET | Freq: Once | ORAL | Status: AC
  Administered 2013-11-27: 40 meq via ORAL
  Filled 2013-11-27: qty 2

## 2013-11-27 NOTE — Progress Notes (Signed)
New written order received for calcium gluconate acknowledged.  Awaiting from pharmcay

## 2013-11-27 NOTE — Progress Notes (Signed)
CRITICAL VALUE ALERT  Critical value received:  5.6 calcium   Date of notification:  11/27/2013  Time of notification:  0640  Critical value read back:yes  Nurse who received alert:  Ova Freshwater  MD notified (1st page):  Walden Field, MD  Time of first page:  [phone]  MD notified (2nd page):  Time of second page:  Responding MD:  Walden Field, MD   Time MD responded: [phone]*

## 2013-11-27 NOTE — Progress Notes (Signed)
Received critical calcium value of 5.6, MD paged and responded to page, no verbal orders at this time.

## 2013-11-27 NOTE — Progress Notes (Signed)
[facility] PROGRESS NOTE  Patrick [name] [id number] DOB: Jun 16, 1956 DOA: 11/23/2013 PCP: Pcp Not In System   Brief narrative 58 year old male with stage 5 CK D. 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 Patient also had uncontrolled diabetes recently which has been improved after insulin dose adjusted as outpatient. Patient has been evaluated at [facility] for transplant and has a left UE AV graft.  Patient on presentation was found to have significant anemia with an and Metabolic acidosis significantly low bicarbonate and hypokalemia.   Assessment/Plan: Anemia Likely secondary to chronic kidney disease. Iron  panel does not suggest iron deficiency.  Stool for occult blood negative.  Patient received 2 units PRBC on 5/1 as  the hemoglobin was  to 6.5. Improved to 8.7. Will follow up with renal as outpt  Anion gap metabolic acidosis Possible in the setting of underlying kidney disease with associated dehydration. Has normal lactic acid level. Patient does not have any abdominal symptoms. He has been placed on bicarbonate drip and levels slowly improving. Gap of 16 today and co2 of 24. D/c bicarb drip today. -Appreciate renal recommendations. Low  vitamin D level. Started on calcitriol. Has persistent low calcium level and getting IV calcium gluconate. Started on calcium carbonate tablets 3 times a day. replenish low k and mg. Monitor BMET in am.  Renal transplant  with rejection Creatinine of 4.8 on presentation.  Prograf level therapeutic. UA negative for UTI. Continue CellCept, Prograf and low-dose prednisone. creatinine improving slowly ( 3.71 today)  -Renal function slowly improving. As per my discussion with his nephrologist Dr Sheran Luz  at [facility] on 4/30 patient is planned to be  evaluated for renal transplant and recommended that he be established care with nephrology in [place] for potential need of dialysis. -follow up with Dr Justin Mend as outpt.  Hypocalcemia Monitor on telemetry. Calcium level persistently low and has been getting IV calcium gluconate supplementation with magnesium oxide and IV magnesium sulfate. Clinically stable. Monitor with repeat labs. Start her on calcium carbonate tablets and just atrial portal vitamin D levels. Added magnesium oxide.  Hypokalemia/ hypomagnesemia  Replenished  Diabetes mellitus Hb A1c of 8.2. continue home dose insulin and sliding scale insulin.  Nuasea, vomiting and weakness Likely secondary to dehydration and possible underlying uremia. Resolved.   Positive nuclear stress test in 03/15 As per results from [place],  patient had positive nuclear stress test with findings of moderate apical and periapical ischemia with an EF of 62% and septal dyskinesis. continue ASA. Will check lipid panel. Will place on low dose metoprolol once BP more stable. Patient will need cardiology consult once acute issue has resolved. He does not have any chest pain. casrdiac w/up (cardiac cath ) can be done as outpt.   HTN  on low dose amlodipine and coreg bid   Code Status: Full code Family Communication: none at bedside today Disposition Plan: Possibly discharge home in am if renal fn and calcium level continues to improve   Consultants:  [place] kidney  Procedures:  none  Antibiotics:  none  HPI/Subjective: Patient seen and examined this morning. Informs feeling much better today  Objective: Filed Vitals:   11/27/13 1435  BP: 119/60  Pulse: 57  Temp: 98.5 F (36.9 C)  Resp: 17  Intake/Output Summary (Last 24 hours) at 11/27/13 1456 Last data filed at 11/27/13 1436  Gross per 24 hour  Intake   2692 ml  Output   1650 ml  Net   1042 ml   Filed Weights   11/24/13 0330   Weight: 95.4 kg (210 lb 5.1 oz)    Exam:   General:  Middle aged obese  Male in no acute distress   HEENT: Pallor present, moist oral mucosa  Chest: Clear to auscultation bilaterally, no added sounds  CVS: Normal S1 and S2, no murmurs or gallop  Abdomen: Midline surgical scar, soft, nontender, nondistended, bowel sounds present  Extremities: Warm, no edema  CNS: AAO x3    Data Reviewed: Basic Metabolic Panel:  Recent Labs Lab 11/23/13 2313 11/24/13 1022  11/25/13 0133 11/25/13 0645 11/25/13 1130 11/26/13 0550 11/27/13 0546  NA 141 141  < > 140 143 143 146 145  K 3.5* 3.4*  < > 3.9 3.4* 3.0* 3.3* 3.2*  CL 110 111  < > 108 110 110 111 105  CO2 10* 10*  < > 12* 14* 16* 18* 24  GLUCOSE 96 100*  < > 113* 95 110* 100* 94  BUN 57* 54*  < > 52* 52* 49* 49* 41*  CREATININE 4.81* 4.49*  < > 4.67* 4.64* 4.70* 4.27* 3.71*  CALCIUM 5.4* 5.3*  < > 5.6* 5.6* 5.5* 5.4* 5.6*  MG  --  1.2*  --   --   --   --  1.7 1.6  PHOS  --  4.1  --   --   --   --   --   --   < > = values in this interval not displayed. Liver Function Tests:  Recent Labs Lab 11/23/13 2313  AST 8  ALT 6  ALKPHOS 57  BILITOT 0.3  PROT 8.1  ALBUMIN 3.9   No results found for this basename: LIPASE, AMYLASE,  in the last 168 hours No results found for this basename: AMMONIA,  in the last 168 hours CBC:  Recent Labs Lab 11/23/13 2313 11/24/13 1022 11/25/13 0645 11/26/13 0550 11/27/13 0546  WBC 7.4  --  4.6 7.0 6.7  NEUTROABS 5.9  --   --   --   --   HGB 6.2* 7.0* 6.5* 8.5* 8.7*  HCT 18.6* 20.9* 18.9* 24.5* 25.4*  MCV 82.7  --  81.1 82.2 83.3  PLT 184  --  127* 128* 129*   Cardiac Enzymes: No results found for this basename: CKTOTAL, CKMB, CKMBINDEX, TROPONINI,  in the last 168 hours BNP (last 3 results) No results found for this basename: PROBNP,  in the last 8760 hours CBG:  Recent Labs Lab 11/26/13 1144 11/26/13 1719 11/26/13 2143 11/27/13 0758 11/27/13 1152  GLUCAP 75 149* 166*  89 102*    No results found for this or any previous visit (from the past 240 hour(s)).   Studies: No results found.  Scheduled Meds: . amLODipine  2.5 mg Oral Daily  . aspirin  81 mg Oral Daily  . calcitRIOL  0.25 mcg Oral Daily  . calcium carbonate  2 tablet Oral TID  . carvedilol  25 mg Oral BID WC  . heparin  5,000 Units Subcutaneous 3 times per day  . insulin aspart  0-5 Units Subcutaneous QHS  . insulin aspart  0-9 Units Subcutaneous TID WC  . insulin aspart  7 Units Subcutaneous TID WC  . insulin detemir  5 Units Subcutaneous QHS  .  magnesium oxide  200 mg Oral Daily  . mycophenolate  750 mg Oral BID  . predniSONE  5 mg Oral Daily  . sodium chloride  3 mL Intravenous Q12H  . tacrolimus  7 mg Oral BID   Continuous Infusions:      Time spent: 25 minutes    Raiyah Speakman  [facility] Pager [phone] If 7PM-7AM, please contact night-coverage at [email or web address], password [facility] 11/27/2013, 2:56 PM  LOS: 4 days

## 2013-11-28 DIAGNOSIS — I472 Ventricular tachycardia, unspecified: Secondary | ICD-10-CM

## 2013-11-28 DIAGNOSIS — D631 Anemia in chronic kidney disease: Secondary | ICD-10-CM | POA: Diagnosis not present

## 2013-11-28 DIAGNOSIS — I4729 Other ventricular tachycardia: Secondary | ICD-10-CM

## 2013-11-28 DIAGNOSIS — E876 Hypokalemia: Secondary | ICD-10-CM

## 2013-11-28 DIAGNOSIS — Z940 Kidney transplant status: Secondary | ICD-10-CM | POA: Diagnosis not present

## 2013-11-28 DIAGNOSIS — N185 Chronic kidney disease, stage 5: Secondary | ICD-10-CM | POA: Diagnosis not present

## 2013-11-28 DIAGNOSIS — E1169 Type 2 diabetes mellitus with other specified complication: Secondary | ICD-10-CM | POA: Diagnosis not present

## 2013-11-28 DIAGNOSIS — E872 Acidosis: Secondary | ICD-10-CM | POA: Diagnosis not present

## 2013-11-28 LAB — GLUCOSE, CAPILLARY
Glucose-Capillary: 96 mg/dL (ref 70–99)
Glucose-Capillary: 69 mg/dL — ABNORMAL LOW (ref 70–99)
Glucose-Capillary: 92 mg/dL (ref 70–99)
Glucose-Capillary: 187 mg/dL — ABNORMAL HIGH (ref 70–99)
Glucose-Capillary: 183 mg/dL — ABNORMAL HIGH (ref 70–99)

## 2013-11-28 LAB — BASIC METABOLIC PANEL
BUN: 38 mg/dL — ABNORMAL HIGH (ref 6–23)
CALCIUM: 6 mg/dL — AB (ref 8.4–10.5)
CO2: 25 mEq/L (ref 19–32)
Chloride: 103 mEq/L (ref 96–112)
Creatinine, Ser: 3.48 mg/dL — ABNORMAL HIGH (ref 0.50–1.35)
GFR calc Af Amer: 21 mL/min — ABNORMAL LOW (ref >90)
GFR calc non Af Amer: 18 mL/min — ABNORMAL LOW (ref >90)
Glucose, Bld: 90 mg/dL (ref 70–99)
POTASSIUM: 3.3 meq/L — AB (ref 3.7–5.3)
Sodium: 142 mEq/L (ref 137–147)

## 2013-11-28 LAB — ALBUMIN: Albumin: 3.1 g/dL — ABNORMAL LOW (ref 3.5–5.2)

## 2013-11-28 MED ORDER — GLUCERNA SHAKE PO LIQD
237.0000 mL | ORAL | Status: DC
  Administered 2013-11-28: 237 mL via ORAL

## 2013-11-28 MED ORDER — CALCITRIOL 0.5 MCG PO CAPS
0.5000 ug | ORAL_CAPSULE | Freq: Every day | ORAL | Status: DC
  Administered 2013-11-29: 0.5 ug via ORAL
  Filled 2013-11-28: qty 1

## 2013-11-28 MED ORDER — CALCIUM CARBONATE ANTACID 500 MG PO CHEW
3.0000 | CHEWABLE_TABLET | Freq: Three times a day (TID) | ORAL | Status: DC
  Administered 2013-11-28 (×2): 600 mg via ORAL
  Filled 2013-11-28 (×4): qty 3

## 2013-11-28 MED ORDER — CALCITRIOL 0.25 MCG PO CAPS
0.2500 ug | ORAL_CAPSULE | Freq: Once | ORAL | Status: AC
  Administered 2013-11-28: 0.25 ug via ORAL
  Filled 2013-11-28: qty 1

## 2013-11-28 MED ORDER — POTASSIUM CHLORIDE CRYS ER 20 MEQ PO TBCR
40.0000 meq | EXTENDED_RELEASE_TABLET | Freq: Once | ORAL | Status: AC
  Administered 2013-11-28: 40 meq via ORAL
  Filled 2013-11-28: qty 2

## 2013-11-28 MED ORDER — INSULIN ASPART 100 UNIT/ML ~~LOC~~ SOLN
5.0000 [IU] | Freq: Three times a day (TID) | SUBCUTANEOUS | Status: DC
  Administered 2013-11-28 – 2013-11-29 (×3): 5 [IU] via SUBCUTANEOUS

## 2013-11-28 NOTE — Progress Notes (Addendum)
[facility] PROGRESS NOTE  Patrick [name] [id number] DOB: 10-27-1955 DOA: 11/23/2013 PCP: Pcp Not In System   Brief narrative 58 year old male with stage 5 CK D. 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 Patient also had uncontrolled diabetes recently which has been improved after insulin dose adjusted as outpatient. Patient has been evaluated at [facility] for transplant and has a left UE AV graft.  Patient on presentation was found to have significant anemia with an and Metabolic acidosis significantly low bicarbonate and hypokalemia.   Assessment/Plan: Anemia Likely secondary to chronic kidney disease. Iron  panel does not suggest iron deficiency.  Stool for occult blood negative.  Patient received 2 units PRBC on 5/1 as  the hemoglobin was  to 6.5. Improved to 8.7. Will follow up with renal as outpt stable  Anion gap metabolic acidosis Possible in the setting of underlying kidney disease with associated dehydration. Had normal lactic acid level. Patient does not have any abdominal symptoms. He was placed on bicarbonate drip and levels slowly improved & D/c'ed bicarb drip. -Appreciate renal recommendations. Low  vitamin D level.   Renal transplant  with rejection Creatinine of 4.8 on presentation.  Prograf level therapeutic. UA negative for UTI. Continue CellCept, Prograf and low-dose prednisone. creatinine improving slowly ( 3.4 today) -Renal function slowly improving. As per Dr. Verita Lamb d/w with his nephrologist Dr Sheran Luz at [facility] on 4/30 patient is planned to be  evaluated for renal transplant and recommended that he be established care with nephrology in [place] for potential need of dialysis. -follow up with Dr Justin Mend as  outpt.  Hypocalcemia Monitor on telemetry. Calcium level persistently low and has been getting intermittent IV calcium gluconate supplementation with magnesium oxide and IV magnesium sulfate. Clinically stable.  - Corrected serum calcium on 5/4:6.7. After discussing with Dr. Florene Glen nephrology, increased calcitriol from 0.25 > 0.5 mcg daily, TUMS from 2 tabs >3 tabs 3 times a day.  Hypokalemia/ hypomagnesemia  Replace as needed. Aim to keep potassium close to 4 and magnesium close to 2  Diabetes mellitus Hb A1c of 8.2. continue home dose insulin and sliding scale insulin. Hypoglycemic episode on 5/4. Adjust insulins.  Nuasea, vomiting and weakness Likely secondary to dehydration and possible underlying uremia. Resolved.   Positive nuclear stress test in 03/15 As per results from [place],  patient had positive nuclear stress test with findings of moderate apical and periapical ischemia with an EF of 62% and septal dyskinesis. continue ASA. Continue carvedilol. Patient will need cardiology consult once acute issue has resolved. He does not have any chest pain. cardiac w/up (cardiac cath ) can be done as outpt.   HTN  on low dose amlodipine and coreg bid  NSVT  - patient had 17 beat NSVT on 11/25/13 at 343 p.m. Hypokalemia, hypomagnesemia and hypocalcemia as above. Continue monitoring on telemetry. Check 2-D echo & if abnormal- will have IP Cardiology to consult. Continue Coreg-may have to titrate.   Thrombocytopenia  - Likely secondary to immunosuppressants. Stable     Code Status: Full code Family Communication: none at bedside today Disposition Plan:  DC home when electrolytes stable.    Consultants:   nephrology   Procedures:  none  Antibiotics:  none  HPI/Subjective:  patient denies complaints and anxious to  go home.   Objective: Filed Vitals:   11/28/13 1417  BP: 115/72  Pulse: 65  Temp: 98.4 F (36.9 C)  Resp: 16    Intake/Output Summary (Last 24 hours) at  11/28/13 1504 Last data filed at 11/28/13 1417  Gross per 24 hour  Intake    222 ml  Output   1200 ml  Net   -978 ml   Filed Weights   11/24/13 0330  Weight: 95.4 kg (210 lb 5.1 oz)    Exam:   General:  Middle aged obese  Male in no acute distress   Chest: Clear to auscultation bilaterally, no added sounds  CVS: Normal S1 and S2, no murmurs or gallop. Telemetry: Sinus rhythm. Patient had a 17 beat NSVT on 11/25/13 3:43 PM   Abdomen: Midline surgical scar, soft, nontender, nondistended, bowel sounds present  Extremities: Warm, no edema  CNS: AAO x3   Data Reviewed: Basic Metabolic Panel:  Recent Labs Lab 11/23/13 2313 11/24/13 1022  11/25/13 0645 11/25/13 1130 11/26/13 0550 11/27/13 0546 11/28/13 0447  NA 141 141  < > 143 143 146 145 142  K 3.5* 3.4*  < > 3.4* 3.0* 3.3* 3.2* 3.3*  CL 110 111  < > 110 110 111 105 103  CO2 10* 10*  < > 14* 16* 18* 24 25  GLUCOSE 96 100*  < > 95 110* 100* 94 90  BUN 57* 54*  < > 52* 49* 49* 41* 38*  CREATININE 4.81* 4.49*  < > 4.64* 4.70* 4.27* 3.71* 3.48*  CALCIUM 5.4* 5.3*  < > 5.6* 5.5* 5.4* 5.6* 6.0*  MG  --  1.2*  --   --   --  1.7 1.6  --   PHOS  --  4.1  --   --   --   --   --   --   < > = values in this interval not displayed. Liver Function Tests:  Recent Labs Lab 11/23/13 2313 11/28/13 0447  AST 8  --   ALT 6  --   ALKPHOS 57  --   BILITOT 0.3  --   PROT 8.1  --   ALBUMIN 3.9 3.1*   No results found for this basename: LIPASE, AMYLASE,  in the last 168 hours No results found for this basename: AMMONIA,  in the last 168 hours CBC:  Recent Labs Lab 11/23/13 2313 11/24/13 1022 11/25/13 0645 11/26/13 0550 11/27/13 0546  WBC 7.4  --  4.6 7.0 6.7  NEUTROABS 5.9  --   --   --   --   HGB 6.2* 7.0* 6.5* 8.5* 8.7*  HCT 18.6* 20.9* 18.9* 24.5* 25.4*  MCV 82.7  --  81.1 82.2 83.3  PLT 184  --  127* 128* 129*   Cardiac Enzymes: No results found for this basename: CKTOTAL, CKMB, CKMBINDEX, TROPONINI,  in the  last 168 hours BNP (last 3 results) No results found for this basename: PROBNP,  in the last 8760 hours CBG:  Recent Labs Lab 11/27/13 1810 11/27/13 2304 11/28/13 0801 11/28/13 1150 11/28/13 1229  GLUCAP 152* 149* 96 69* 92    No results found for this or any previous visit (from the past 240 hour(s)).   Studies: No results found.  Scheduled Meds: . amLODipine  2.5 mg Oral Daily  . aspirin  81 mg Oral Daily  . [START ON 11/29/2013] calcitRIOL  0.5 mcg Oral Daily  . calcium carbonate  3 tablet Oral TID  . carvedilol  25 mg Oral BID WC  . heparin  5,000 Units Subcutaneous 3 times per day  . insulin aspart  0-5 Units Subcutaneous QHS  . insulin aspart  0-9 Units Subcutaneous TID WC  . insulin aspart  7 Units Subcutaneous TID WC  . insulin detemir  5 Units Subcutaneous QHS  . magnesium oxide  200 mg Oral Daily  . mycophenolate  750 mg Oral BID  . predniSONE  5 mg Oral Daily  . sodium chloride  3 mL Intravenous Q12H  . tacrolimus  7 mg Oral BID   Continuous Infusions:      Time spent: 25 minutes   Modena Jansky, MD, FACP, [facility]. [facility] Pager [phone]  If 7PM-7AM, please contact night-coverage [email or web address] Password [email or web address] 11/28/2013, 3:11 PM  11/28/2013, 3:04 PM  LOS: 5 days

## 2013-11-28 NOTE — Progress Notes (Signed)
Hypoglycemic Event  CBG: 69  Treatment: 15 GM carbohydrate snack  Symptoms: None  Follow-up CBG: Time:1220 CBG Result:92  Possible Reasons for Event: Inadequate meal intake    Patrick [name]

## 2013-11-28 NOTE — Plan of Care (Signed)
Problem: Food- and Nutrition-Related Knowledge Deficit (NB-1.1) Goal: Nutrition education Formal process to instruct or train a patient/client in a skill or to impart knowledge to help patients/clients voluntarily manage or modify food choices and eating behavior to maintain or improve health. Outcome: Progressing  RD consulted for nutrition education regarding diabetes.     Lab Results  Component Value Date    HGBA1C 8.2* 11/07/2013    RD met with pt and wife re: nutrition status.  Pt states he is eating "as I usually do, just what I want."  Wife states she feels pt is not eating well.  RD reviewed menu with pt and assisted with dinner order.  Pt and wife agree blood sugars were under control PTA and have remained in good control since admission.   RD educated on the importance of nutrition.  Wife requests help choosing supplement for home use.  Recommend Glucerna Shakes (not chocolate) for pt.  Will order for pt to trial as inpatient.   Discussed importance of controlled and consistent carbohydrate intake throughout the day. Provided examples of ways to balance meals/snacks and encouraged intake of high-fiber, whole grain complex carbohydrates. Teach back method used.  Expect good compliance.  Body mass index is 32.93 kg/(m^2). Pt meets criteria for obese, class 2 based on current BMI.  Current diet order is Renal/CHO Mod Medium, patient is consuming approximately 75-100% of meals at this time. Labs and medications reviewed. No further nutrition interventions warranted at this time. RD contact information provided. If additional nutrition issues arise, please re-consult RD.  Brynda Greathouse, MS RD LDN Clinical Inpatient Dietitian Pager: [phone] Weekend/After hours pager: [phone]

## 2013-11-28 NOTE — Progress Notes (Signed)
Assessment/Plan:  Stage 5 Chronic renal insufficiency S/p renal transplant 2008 Worsening creatinine secondary to acute vascular rejection. Possible component of ATN, improving. Would like to see if real and stable in AM. We will arrange OP follow up. Anemia with normal iron sudies and normal reticulocyte count  S/p parathyroidectomy and autotransplant with hypocalcemia(after being off calcitriol post discharge from [facility]). Vitamin D Dependent, likely AVG will need out patient fistulogram when seen in clinic  Subjective: Interval History: Reports calcitriol stopped at [facility] when discharged a few weeks ago.  Objective: Vital signs in last 24 hours: Temp:  [98.3 F (36.8 C)-98.8 F (37.1 C)] 98.4 F (36.9 C) (05/04 1417) Pulse Rate:  [60-65] 65 (05/04 1417) Resp:  [16] 16 (05/04 1417) BP: (105-132)/(44-92) 115/72 mmHg (05/04 1417) SpO2:  [94 %-99 %] 94 % (05/04 1417) Weight change:   Intake/Output from previous day: 05/03 0701 - 05/04 0700 In: 477 [P.O.:477] Out: 2300 [Urine:2300] Intake/Output this shift: Total I/O In: 222 [P.O.:222] Out: -   General appearance: alert and cooperative Resp: clear to auscultation bilaterally Chest wall: no tenderness Cardio: regular rate and rhythm, S1, S2 normal, no murmur, click, rub or gallop Extremities: extremities normal, atraumatic, no cyanosis or edema AVG LUE has a pulse  Lab Results:  Recent Labs  11/26/13 0550 11/27/13 0546  WBC 7.0 6.7  HGB 8.5* 8.7*  HCT 24.5* 25.4*  PLT 128* 129*   BMET:  Recent Labs  11/27/13 0546 11/28/13 0447  NA 145 142  K 3.2* 3.3*  CL 105 103  CO2 24 25  GLUCOSE 94 90  BUN 41* 38*  CREATININE 3.71* 3.48*  CALCIUM 5.6* 6.0*   No results found for this basename: PTH,  in the last 72 hours Iron Studies: No results found for this basename: IRON, TIBC, TRANSFERRIN, FERRITIN,  in the last 72 hours Studies/Results: No results found.  Scheduled: . amLODipine  2.5 mg Oral Daily  . aspirin  81  mg Oral Daily  . [START ON 11/29/2013] calcitRIOL  0.5 mcg Oral Daily  . calcium carbonate  3 tablet Oral TID  . carvedilol  25 mg Oral BID WC  . heparin  5,000 Units Subcutaneous 3 times per day  . insulin aspart  0-5 Units Subcutaneous QHS  . insulin aspart  0-9 Units Subcutaneous TID WC  . insulin aspart  7 Units Subcutaneous TID WC  . insulin detemir  5 Units Subcutaneous QHS  . magnesium oxide  200 mg Oral Daily  . mycophenolate  750 mg Oral BID  . predniSONE  5 mg Oral Daily  . sodium chloride  3 mL Intravenous Q12H  . tacrolimus  7 mg Oral BID     LOS: 5 days   Estanislado Emms 11/28/2013,2:40 PM

## 2013-11-28 NOTE — Progress Notes (Signed)
CRITICAL VALUE ALERT  Critical value received:  6.0  Date of notification:  11/28/2013  Time of notification:  0640  Critical value read back:yes  Nurse who received alert:  Johny Shock, RN  MD notified (1st page):  K, Schorr  Time of first page:  [phone]  MD notified (2nd page):  Time of second page:  Responding MD:    Time MD responded:

## 2013-11-29 DIAGNOSIS — E876 Hypokalemia: Secondary | ICD-10-CM | POA: Diagnosis not present

## 2013-11-29 DIAGNOSIS — N185 Chronic kidney disease, stage 5: Secondary | ICD-10-CM | POA: Diagnosis not present

## 2013-11-29 DIAGNOSIS — Z940 Kidney transplant status: Secondary | ICD-10-CM | POA: Diagnosis not present

## 2013-11-29 DIAGNOSIS — D631 Anemia in chronic kidney disease: Secondary | ICD-10-CM | POA: Diagnosis not present

## 2013-11-29 DIAGNOSIS — I472 Ventricular tachycardia: Secondary | ICD-10-CM | POA: Diagnosis not present

## 2013-11-29 DIAGNOSIS — T8610 Unspecified complication of kidney transplant: Secondary | ICD-10-CM | POA: Diagnosis not present

## 2013-11-29 DIAGNOSIS — I379 Nonrheumatic pulmonary valve disorder, unspecified: Secondary | ICD-10-CM

## 2013-11-29 DIAGNOSIS — E872 Acidosis: Secondary | ICD-10-CM | POA: Diagnosis not present

## 2013-11-29 LAB — RENAL FUNCTION PANEL
ALBUMIN: 3 g/dL — AB (ref 3.5–5.2)
BUN: 36 mg/dL — ABNORMAL HIGH (ref 6–23)
CO2: 25 mEq/L (ref 19–32)
Calcium: 6 mg/dL — CL (ref 8.4–10.5)
Chloride: 103 mEq/L (ref 96–112)
Creatinine, Ser: 3.45 mg/dL — ABNORMAL HIGH (ref 0.50–1.35)
GFR calc non Af Amer: 18 mL/min — ABNORMAL LOW (ref >90)
GFR, EST AFRICAN AMERICAN: 21 mL/min — AB (ref >90)
GLUCOSE: 118 mg/dL — AB (ref 70–99)
Phosphorus: 2.5 mg/dL (ref 2.3–4.6)
Potassium: 3.6 mEq/L — ABNORMAL LOW (ref 3.7–5.3)
Sodium: 145 mEq/L (ref 137–147)

## 2013-11-29 LAB — CBC
HCT: 26.3 % — ABNORMAL LOW (ref 39.0–52.0)
HEMOGLOBIN: 8.7 g/dL — AB (ref 13.0–17.0)
MCH: 28.3 pg (ref 26.0–34.0)
MCHC: 33.1 g/dL (ref 30.0–36.0)
MCV: 85.7 fL (ref 78.0–100.0)
Platelets: 131 10*3/uL — ABNORMAL LOW (ref 150–400)
RBC: 3.07 MIL/uL — ABNORMAL LOW (ref 4.22–5.81)
RDW: 16.7 % — ABNORMAL HIGH (ref 11.5–15.5)
WBC: 5.3 10*3/uL (ref 4.0–10.5)

## 2013-11-29 LAB — GLUCOSE, CAPILLARY
GLUCOSE-CAPILLARY: 91 mg/dL (ref 70–99)
Glucose-Capillary: 165 mg/dL — ABNORMAL HIGH (ref 70–99)

## 2013-11-29 LAB — MAGNESIUM: Magnesium: 1.6 mg/dL (ref 1.5–2.5)

## 2013-11-29 MED ORDER — MAGNESIUM OXIDE 400 (241.3 MG) MG PO TABS: 400.0000 mg | ORAL_TABLET | Freq: Every day | ORAL | Status: DC

## 2013-11-29 MED ORDER — CALCITRIOL 0.5 MCG PO CAPS: 1.5000 ug | ORAL_CAPSULE | Freq: Every day | ORAL | Status: DC

## 2013-11-29 MED ORDER — CALCITRIOL 0.5 MCG PO CAPS
1.0000 ug | ORAL_CAPSULE | Freq: Once | ORAL | Status: AC
  Administered 2013-11-29: 1 ug via ORAL
  Filled 2013-11-29: qty 2

## 2013-11-29 MED ORDER — MAGNESIUM SULFATE 40 MG/ML IJ SOLN
2.0000 g | Freq: Once | INTRAMUSCULAR | Status: AC
  Administered 2013-11-29: 2 g via INTRAVENOUS
  Filled 2013-11-29: qty 50

## 2013-11-29 MED ORDER — CALCIUM CARBONATE 1250 (500 CA) MG PO TABS: 2.0000 | ORAL_TABLET | Freq: Two times a day (BID) | ORAL | Status: DC

## 2013-11-29 MED ORDER — CALCIUM CARBONATE-VITAMIN D 500-400 MG-UNIT PO TABS: 2.0000 | ORAL_TABLET | Freq: Three times a day (TID) | ORAL | Status: DC

## 2013-11-29 MED ORDER — CALCIUM CARBONATE ANTACID 500 MG PO CHEW
4.0000 | CHEWABLE_TABLET | Freq: Three times a day (TID) | ORAL | Status: DC
  Administered 2013-11-29: 800 mg via ORAL

## 2013-11-29 MED ORDER — POTASSIUM CHLORIDE CRYS ER 20 MEQ PO TBCR
40.0000 meq | EXTENDED_RELEASE_TABLET | Freq: Once | ORAL | Status: AC
  Administered 2013-11-29: 40 meq via ORAL
  Filled 2013-11-29: qty 2

## 2013-11-29 NOTE — Discharge Summary (Signed)
Physician Discharge Summary  Patrick [name] [id number] DOB: [birth year] DOA: 11/23/2013  PCP: Pcp Not In System  Admit date: 11/23/2013 Discharge date: 11/29/2013  Time spent: Greater than 30 minutes  Recommendations for Outpatient Follow-up:  1. Dr. Renato Shin, Endocrinology on 12/02/2013. Patient to be seen with repeat labs (CBC, renal panel & magnesium). 2. Dr. Erling Cruz, Nephrology on 12/28/13 3. Patient to go to [facility] on [address] on May 18 for labs per nephrology.  Discharge Diagnoses:  Active Problems:   End-stage renal failure with renal transplant   Type I (juvenile type) diabetes mellitus with renal manifestations, not stated as uncontrolled   Anemia   Renal transplant rejection   Hypocalcemia   Low bicarbonate   Discharge Condition: Improved & Stable  Diet recommendation: Heart healthy and diabetic diet.  Filed Weights   11/24/13 0330  Weight: 95.4 kg (210 lb 5.1 oz)    History of present illness:  58 year old male with ESRD/stage V who received hemodialysis 2004-2008, underwent a deceased donor transplant at [facility] in 2008, had some early rejection in 2008. Since then he has relatively stable renal function although about 2 months ago he was hospitalized for acute rejection that was treated with high-dose steroids. His creatinine apparently increased to 8 and then improved. He now presented with nausea, vomiting with generalized malaise for past several days. Patient also had uncontrolled diabetes recently which has improved after insulin dose adjusted as outpatient. Patient has been evaluated at [facility] for transplant and has a left UE AV graft. In the ED, patient was found to have a bicarbonate of 10.  Hospital Course:   1. Stage V chronic kidney disease: Status post renal transplant 2008. Worsening creatinine secondary to acute vascular rejection and componant of ATN. He was to establish with care with a nephrologist in [place] area due to dialysis requirements in  the near future. Creatinine 4.8 on presentation. This gradually improved to 3.4. Nephrology consulted and made recommendations. 2. High anion gap Metabolic acidosis: May be secondary to renal failure. Lactate normal. Patient was treated with IV bicarbonate drip. Although mesenteric ischemia was on DD, patient did not have any abdominal symptoms and lactate was normal. Improved. 3. Anemia: Of chronic disease. Stool for occult blood negative. Iron panel does not suggest iron deficiency. For low hemoglobin of 6.5, Patient transfused 2 unit of PRBCs and hemoglobin stabilized. 4. Status post renal transplant: Continue immunosuppressants-CellCept, Prograf and low-dose prednisone. Dr. Clementeen Graham discussed with his nephrologist Dr. Sheran Luz at [facility] on 4/30 and patient is planned to be evaluated for renal transplant and he recommended that patient establish care with a local nephrologist in [facility] for dialysis needs. Prograf level 6.7 5. Hypocalcemia: Patient is status post parathyroidectomy and autotransplant. He is likely vitamin D dependent. He was discharged from [facility] off of Calcitriol. Patient's calcium levels were persistently low. He was treated with IV calcium gluconate and oral calcium was added. His calcium slowly improve. He will be discharged on his prior calcitriol dose of 1.5 mcg daily and Os-Cal. Close outpatient followup. 6. Hypomagnesemia: Treated with IV and oral magnesium. Improved  7. Hypokalemia: Replaced  8. Uncontrolled type II DM: hemoglobin A1c: 8.2. Continue home insulin. Patient experienced hypoglycemic episode on 5/4. 9. Nausea, vomiting and weakness: unclear etiology.? Secondary to metabolic abnormality? Uremia. Resolved 10. Positive nuclear stress test in 03/15: As per results from [place], patient had positive nuclear stress test with findings of moderate apical and periapical ischemia with an EF of 62% and septal  dyskinesis. continue ASA. Continue carvedilol. Patient will need  cardiology consult once acute issue has resolved. He does not have any chest pain. cardiac w/up (cardiac cath ) can be done as outpt.  11. Hypertension: Continue amlodipine and Coreg. 12. NSVT: Patient had 17 beats NSVT on 11/25/13. Replaced electrolytes. 2-D echo with normal LVEF. Outpatient followup with cardiology. Continue carvedilol. 13. Thrombocytopenia: Likely from immunosuppressants. Stable  Consultations:   Nephrology  Procedures:   None    Discharge Exam:  Complaints:  Denied complaints. Was anxious to go home.  Filed Vitals:   11/28/13 0629 11/28/13 1417 11/28/13 2248 11/29/13 0622  BP: 132/44 115/72 153/59 148/71  Pulse: 60 65 57 57  Temp: 98.8 F (37.1 C) 98.4 F (36.9 C) 98.4 F (36.9 C) 98.3 F (36.8 C)  TempSrc: Oral Oral Oral Oral  Resp: 16 16 16 18   Height:      Weight:      SpO2: 98% 94% 98% 98%    General: Middle aged obese Male in no acute distress  Chest: Clear to auscultation bilaterally, no added sounds  CVS: Normal S1 and S2, no murmurs or gallop. Telemetry: Sinus rhythm. No further NSVT. Abdomen: Midline surgical scar, soft, nontender, nondistended, bowel sounds present  Extremities: Warm, no edema  CNS: AAO x3   Discharge Instructions       Future Appointments Provider Department Dept Phone   12/15/2013 9:15 AM Josue Hector, MD [place] [facility] [phone]   02/06/2014 2:15 PM Renato Shin, MD [facility] Endocrinology [phone]       Medication List    ASK your doctor about these medications       amLODipine 2.5 MG tablet  Commonly known as:  NORVASC  Take 2.5 mg by mouth daily.     aspirin 81 MG tablet  Take 81 mg by mouth daily.     carvedilol 25 MG tablet  Commonly known as:  COREG  Take 25 mg by mouth 2 (two) times daily with a meal.     Insulin Detemir 100 UNIT/ML Pen  Commonly known as:  LEVEMIR  Inject 5 Units into the skin at bedtime.     insulin lispro 100 UNIT/ML KiwkPen  Commonly  known as:  HUMALOG  Inject 7 Units into the skin 3 (three) times daily.     mycophenolate 250 MG capsule  Commonly known as:  CELLCEPT  Take 750 mg by mouth 2 (two) times daily.     predniSONE 5 MG tablet  Commonly known as:  DELTASONE  Take 5 mg by mouth daily.     tacrolimus 5 MG capsule  Commonly known as:  PROGRAF  Take 5 mg by mouth 2 (two) times daily.     tacrolimus 1 MG capsule  Commonly known as:  PROGRAF  Take 2 mg by mouth 2 (two) times daily.          The results of significant diagnostics from this hospitalization (including imaging, microbiology, ancillary and laboratory) are listed below for reference.    Significant Diagnostic Studies: No results found.  Microbiology: No results found for this or any previous visit (from the past 240 hour(s)).   Labs: Basic Metabolic Panel:  Recent Labs Lab 11/23/13 2313 11/24/13 1022  11/25/13 1130 11/26/13 0550 11/27/13 0546 11/28/13 0447 11/29/13 0423  NA 141 141  < > 143 146 145 142 145  K 3.5* 3.4*  < > 3.0* 3.3* 3.2* 3.3* 3.6*  CL 110 111  < >  110 111 105 103 103  CO2 10* 10*  < > 16* 18* 24 25 25   GLUCOSE 96 100*  < > 110* 100* 94 90 118*  BUN 57* 54*  < > 49* 49* 41* 38* 36*  CREATININE 4.81* 4.49*  < > 4.70* 4.27* 3.71* 3.48* 3.45*  CALCIUM 5.4* 5.3*  < > 5.5* 5.4* 5.6* 6.0* 6.0*  MG  --  1.2*  --   --  1.7 1.6  --  1.6  PHOS  --  4.1  --   --   --   --   --  2.5  < > = values in this interval not displayed. Liver Function Tests:  Recent Labs Lab 11/23/13 2313 11/28/13 0447 11/29/13 0423  AST 8  --   --   ALT 6  --   --   ALKPHOS 57  --   --   BILITOT 0.3  --   --   PROT 8.1  --   --   ALBUMIN 3.9 3.1* 3.0*   No results found for this basename: LIPASE, AMYLASE,  in the last 168 hours No results found for this basename: AMMONIA,  in the last 168 hours CBC:  Recent Labs Lab 11/23/13 2313 11/24/13 1022 11/25/13 0645 11/26/13 0550 11/27/13 0546 11/29/13 0427  WBC 7.4  --  4.6 7.0  6.7 5.3  NEUTROABS 5.9  --   --   --   --   --   HGB 6.2* 7.0* 6.5* 8.5* 8.7* 8.7*  HCT 18.6* 20.9* 18.9* 24.5* 25.4* 26.3*  MCV 82.7  --  81.1 82.2 83.3 85.7  PLT 184  --  127* 128* 129* 131*   Cardiac Enzymes: No results found for this basename: CKTOTAL, CKMB, CKMBINDEX, TROPONINI,  in the last 168 hours BNP: BNP (last 3 results) No results found for this basename: PROBNP,  in the last 8760 hours CBG:  Recent Labs Lab 11/28/13 1229 11/28/13 1705 11/28/13 2252 11/29/13 0735 11/29/13 1150  GLUCAP 92 187* 183* 91 165*    Additional labs: 1.  Vitamin D, 25-hydroxy: 14 2. Anemia panel: Iron 105, TIBC 237, saturation ratios 44, ferritin 1245, folate 9.7 and B12: 514, reticulocytes 21 3. Tacrolimus level: 6.7 4. Hemoglobin A1c: 8.2 5. FOBT: Negative 6. 2-D echo 11/29/13: Study Conclusions  - Left ventricle: The cavity size was normal. Wall thickness was increased in a pattern of mild LVH. Systolic function was normal. The estimated ejection fraction was in the range of 50% to 55%. Wall motion was normal; there were no regional wall motion abnormalities. Doppler parameters are consistent with abnormal left ventricular relaxation (grade 1 diastolic dysfunction). - Mitral valve: Calcified annulus. Mildly thickened leaflets . - Left atrium: The atrium was mildly dilated.     Signed:  Modena Jansky, MD, FACP, [facility]. [facility] Pager [phone]  If 7PM-7AM, please contact night-coverage [email or web address] Password [email or web address] 11/29/2013, 2:16 PM

## 2013-11-29 NOTE — Progress Notes (Signed)
Patrick [name] to be D/C'd Home per MD order.  Discussed with the patient and all questions fully answered.    Medication List         amLODipine 2.5 MG tablet  Commonly known as:  NORVASC  Take 2.5 mg by mouth daily.     aspirin 81 MG tablet  Take 81 mg by mouth daily.     calcitRIOL 0.5 MCG capsule  Commonly known as:  ROCALTROL  Take 3 capsules (1.5 mcg total) by mouth daily.  Start taking on:  11/30/2013     calcium carbonate 1250 MG tablet  Commonly known as:  OS-CAL - dosed in mg of elemental calcium  Take 2 tablets (1,000 mg of elemental calcium total) by mouth 2 (two) times daily with a meal.     carvedilol 25 MG tablet  Commonly known as:  COREG  Take 25 mg by mouth 2 (two) times daily with a meal.     Insulin Detemir 100 UNIT/ML Pen  Commonly known as:  LEVEMIR  Inject 5 Units into the skin at bedtime.     insulin lispro 100 UNIT/ML KiwkPen  Commonly known as:  HUMALOG  Inject 7 Units into the skin 3 (three) times daily.     magnesium oxide 400 (241.3 MG) MG tablet  Commonly known as:  MAG-OX  Take 1 tablet (400 mg total) by mouth daily.     mycophenolate 250 MG capsule  Commonly known as:  CELLCEPT  Take 750 mg by mouth 2 (two) times daily.     predniSONE 5 MG tablet  Commonly known as:  DELTASONE  Take 5 mg by mouth daily.     tacrolimus 5 MG capsule  Commonly known as:  PROGRAF  Take 5 mg by mouth 2 (two) times daily.     tacrolimus 1 MG capsule  Commonly known as:  PROGRAF  Take 2 mg by mouth 2 (two) times daily.        VSS, Skin clean, dry and intact without evidence of skin break down, no evidence of skin tears noted. IV catheter discontinued intact. Site without signs and symptoms of complications. Dressing and pressure applied.  An After Visit Summary was printed and given to the patient.  D/c education completed with patient/family including follow up instructions, medication list, d/c activities limitations if indicated, with other d/c  instructions as indicated by MD - patient able to verbalize understanding, all questions fully answered.   Patient instructed to return to ED, call 911, or call MD for any changes in condition.   Patient escorted via [place], and D/C home via private auto with wife.  Micki Riley 11/29/2013 3:53 PM

## 2013-11-29 NOTE — Progress Notes (Signed)
  Echocardiogram 2D Echocardiogram has been performed.  Valinda Hoar 11/29/2013, 9:09 AM

## 2013-11-29 NOTE — Progress Notes (Signed)
Assessment/Plan:  Stage 5 Chronic renal insufficiency S/p renal transplant 2008 Worsening creatinine secondary to acute vascular rejection. Possible component of ATN, improving.  We will arrange OP follow up.  He will see Dr. Loanne Drilling Friday and get labs there. Then labs on May 18 at [facility]. Appt with me on June 3rd 0815 at [facility]. Anemia with normal iron sudies and normal reticulocyte count  S/p parathyroidectomy and autotransplant with hypocalcemia(after being off calcitriol post discharge from [facility]). Vitamin D Dependent, pt reports dose of 1.5 mcg Calcitriol daily.  Will need an increase at discharge. AVG will need out patient eval when seen in clinic  Subjective: Interval History: Wants to go home  Objective: Vital signs in last 24 hours: Temp:  [98.3 F (36.8 C)-98.4 F (36.9 C)] 98.3 F (36.8 C) (05/05 0622) Pulse Rate:  [57-65] 57 (05/05 0622) Resp:  [16-18] 18 (05/05 0622) BP: (115-153)/(59-72) 148/71 mmHg (05/05 0622) SpO2:  [94 %-98 %] 98 % (05/05 0622) Weight change:   Intake/Output from previous day: 05/04 0701 - 05/05 0700 In: 465 [P.O.:462; I.V.:3] Out: -  Intake/Output this shift:    no change in pe  Lab Results:  Recent Labs  11/27/13 0546 11/29/13 0427  WBC 6.7 5.3  HGB 8.7* 8.7*  HCT 25.4* 26.3*  PLT 129* 131*   BMET:  Recent Labs  11/28/13 0447 11/29/13 0423  NA 142 145  K 3.3* 3.6*  CL 103 103  CO2 25 25  GLUCOSE 90 118*  BUN 38* 36*  CREATININE 3.48* 3.45*  CALCIUM 6.0* 6.0*   No results found for this basename: PTH,  in the last 72 hours Iron Studies: No results found for this basename: IRON, TIBC, TRANSFERRIN, FERRITIN,  in the last 72 hours Studies/Results: No results found.  Scheduled: . amLODipine  2.5 mg Oral Daily  . aspirin  81 mg Oral Daily  . calcitRIOL  0.5 mcg Oral Daily  . calcium carbonate  4 tablet Oral TID  . carvedilol  25 mg Oral BID WC  . feeding supplement (GLUCERNA SHAKE)  237 mL Oral  Q24H  . heparin  5,000 Units Subcutaneous 3 times per day  . insulin aspart  0-9 Units Subcutaneous TID WC  . insulin aspart  5 Units Subcutaneous TID WC  . insulin detemir  5 Units Subcutaneous QHS  . magnesium oxide  200 mg Oral Daily  . magnesium sulfate 1 - 4 g bolus IVPB  2 g Intravenous Once  . mycophenolate  750 mg Oral BID  . predniSONE  5 mg Oral Daily  . sodium chloride  3 mL Intravenous Q12H  . tacrolimus  7 mg Oral BID      LOS: 6 days   Estanislado Emms 11/29/2013,12:26 PM

## 2013-12-02 ENCOUNTER — Ambulatory Visit: Payer: Medicare Other | Admitting: Endocrinology

## 2013-12-02 DIAGNOSIS — Z1159 Encounter for screening for other viral diseases: Secondary | ICD-10-CM | POA: Diagnosis not present

## 2013-12-02 DIAGNOSIS — Z7682 Awaiting organ transplant status: Secondary | ICD-10-CM | POA: Diagnosis not present

## 2013-12-02 DIAGNOSIS — Z125 Encounter for screening for malignant neoplasm of prostate: Secondary | ICD-10-CM | POA: Diagnosis not present

## 2013-12-02 DIAGNOSIS — D899 Disorder involving the immune mechanism, unspecified: Secondary | ICD-10-CM | POA: Diagnosis not present

## 2013-12-02 DIAGNOSIS — Z01818 Encounter for other preprocedural examination: Secondary | ICD-10-CM | POA: Diagnosis not present

## 2013-12-02 DIAGNOSIS — I25810 Atherosclerosis of coronary artery bypass graft(s) without angina pectoris: Secondary | ICD-10-CM | POA: Diagnosis not present

## 2013-12-02 DIAGNOSIS — N185 Chronic kidney disease, stage 5: Secondary | ICD-10-CM | POA: Diagnosis not present

## 2013-12-06 ENCOUNTER — Ambulatory Visit (INDEPENDENT_AMBULATORY_CARE_PROVIDER_SITE_OTHER): Payer: Medicare Other | Admitting: Endocrinology

## 2013-12-06 ENCOUNTER — Encounter: Payer: Self-pay | Admitting: Endocrinology

## 2013-12-06 VITALS — BP 112/80 | HR 70 | Temp 98.5°F | Wt 213.0 lb

## 2013-12-06 DIAGNOSIS — E1029 Type 1 diabetes mellitus with other diabetic kidney complication: Secondary | ICD-10-CM | POA: Diagnosis not present

## 2013-12-06 NOTE — Progress Notes (Signed)
Subjective:    Patient ID: Patrick [name], male    DOB: [birth year], 58 y.o.   MRN: [id number]  HPI Pt returns for f/u of insulin-requiring DM (dx'ed mid-2014, when he presented with severe hyperglycemia; he has mild neuropathy of the lower extremities, and associated renal failure (transplant in 2009); he has never had pancreatitis, severe hypoglycemia, or DKA; he takes multiple daily injections).  he brings a record of his cbg's which i have reviewed today.  It varies from 112-152.  It is in general higher as the day goes on.  He denies hypoglycemia.   Past Medical History  Diagnosis Date  . Hypertension   . Diabetes mellitus without complication   . End-stage renal failure with renal transplant   . Gout   . Impotence of organic origin   . Type I (juvenile type) diabetes mellitus with renal manifestations, not stated as uncontrolled   . Secondary hyperparathyroidism     Past Surgical History  Procedure Laterality Date  . Birth defect heart surgery  1996  . Kidney transplant  2009  . Appendectomy      History   Social History  . Marital Status: Married    Spouse Name: N/A    Number of Children: N/A  . Years of Education: N/A   Occupational History  . Not on file.   Social History Main Topics  . Smoking status: Never Smoker   . Smokeless tobacco: Not on file  . Alcohol Use: No  . Drug Use: No  . Sexual Activity: Not Currently    Birth Control/ Protection: None   Other Topics Concern  . Not on file   Social History Narrative  . No narrative on file    Current Outpatient Prescriptions on File Prior to Visit  Medication Sig Dispense Refill  . amLODipine (NORVASC) 2.5 MG tablet Take 2.5 mg by mouth daily.      Marland Kitchen aspirin 81 MG tablet Take 81 mg by mouth daily.      . calcitRIOL (ROCALTROL) 0.5 MCG capsule Take 3 capsules (1.5 mcg total) by mouth daily.  90 capsule  0  . calcium carbonate (OS-CAL - DOSED IN MG OF ELEMENTAL CALCIUM) 1250 MG tablet Take 2 tablets (1,000  mg of elemental calcium total) by mouth 2 (two) times daily with a meal.  120 tablet  0  . carvedilol (COREG) 25 MG tablet Take 25 mg by mouth 2 (two) times daily with a meal.      . Insulin Detemir (LEVEMIR) 100 UNIT/ML Pen Inject 5 Units into the skin at bedtime.       . insulin lispro (HUMALOG) 100 UNIT/ML KiwkPen Inject 8 Units into the skin 3 (three) times daily.       . magnesium oxide (MAG-OX) 400 (241.3 MG) MG tablet Take 1 tablet (400 mg total) by mouth daily.  10 tablet  0  . mycophenolate (CELLCEPT) 250 MG capsule Take 750 mg by mouth 2 (two) times daily.       . predniSONE (DELTASONE) 5 MG tablet Take 5 mg by mouth daily.      . tacrolimus (PROGRAF) 1 MG capsule Take 2 mg by mouth 2 (two) times daily.      . tacrolimus (PROGRAF) 5 MG capsule Take 5 mg by mouth 2 (two) times daily.       No current facility-administered medications on file prior to visit.    Allergies  Allergen Reactions  . Iodinated Diagnostic Agents Rash    Other  Reaction: burning to mouth, swelling of lips.    No family history on file.  BP 112/80  Pulse 70  Temp(Src) 98.5 F (36.9 C) (Oral)  Wt 213 lb (96.616 kg)  SpO2 96%  Review of Systems He has lost a few lbs.  He denies n/v.      Objective:   Physical Exam VITAL SIGNS:  See vs page GENERAL: no distress   Lab Results  Component Value Date   HGBA1C 8.2* 11/07/2013      Assessment & Plan:  DM: he needs increased rx.  This insulin regimen was chosen from multiple options, as it best matches his insulin to his changing requirements throughout the day.  The benefits of glycemic control must be weighed against the risks of hypoglycemia.   Obesity: this complicates the rx of DM.

## 2013-12-06 NOTE — Patient Instructions (Addendum)
check your blood sugar twice a day.  vary the time of day when you check, between before the 3 meals, and at bedtime.  also check if you have symptoms of your blood sugar being too high or too low.  please keep a record of the readings and bring it to your next appointment here.  please call us sooner if your blood sugar goes below 70, or if you have a lot of readings over 200. As the steroids are reduced, you insulin requirements may decrease further, so you will need to carefully watch this.  Please come back for a follow-up appointment in 3 months.  Please increase the novolog to 8 units 3 times a day (just before each meal).

## 2013-12-09 ENCOUNTER — Telehealth: Payer: Self-pay | Admitting: *Deleted

## 2013-12-09 ENCOUNTER — Other Ambulatory Visit: Payer: Self-pay | Admitting: *Deleted

## 2013-12-09 MED ORDER — INSULIN LISPRO 100 UNIT/ML (KWIKPEN): 8.0000 [IU] | PEN_INJECTOR | Freq: Three times a day (TID) | SUBCUTANEOUS | Status: DC

## 2013-12-09 NOTE — Telephone Encounter (Signed)
Done

## 2013-12-12 DIAGNOSIS — N184 Chronic kidney disease, stage 4 (severe): Secondary | ICD-10-CM | POA: Diagnosis not present

## 2013-12-15 ENCOUNTER — Ambulatory Visit: Payer: Medicare Other | Admitting: Cardiovascular Disease

## 2013-12-20 ENCOUNTER — Other Ambulatory Visit: Payer: Self-pay | Admitting: Endocrinology

## 2013-12-28 ENCOUNTER — Other Ambulatory Visit (HOSPITAL_COMMUNITY): Payer: Self-pay | Admitting: *Deleted

## 2013-12-28 DIAGNOSIS — N2581 Secondary hyperparathyroidism of renal origin: Secondary | ICD-10-CM | POA: Diagnosis not present

## 2013-12-28 DIAGNOSIS — Z940 Kidney transplant status: Secondary | ICD-10-CM | POA: Diagnosis not present

## 2013-12-28 DIAGNOSIS — D649 Anemia, unspecified: Secondary | ICD-10-CM | POA: Diagnosis not present

## 2013-12-28 DIAGNOSIS — N184 Chronic kidney disease, stage 4 (severe): Secondary | ICD-10-CM | POA: Diagnosis not present

## 2013-12-28 NOTE — Progress Notes (Signed)
This encounter was created in error - please disregard.

## 2013-12-29 ENCOUNTER — Non-Acute Institutional Stay (HOSPITAL_COMMUNITY)
Admission: AD | Admit: 2013-12-29 | Discharge: 2013-12-29 | Disposition: A | Discharge status: Home / Self Care | Payer: Medicare Other | Source: Ambulatory Visit | Attending: Nephrology | Admitting: Nephrology

## 2013-12-29 ENCOUNTER — Other Ambulatory Visit (HOSPITAL_COMMUNITY): Payer: Self-pay | Admitting: Nephrology

## 2013-12-29 DIAGNOSIS — D509 Iron deficiency anemia, unspecified: Secondary | ICD-10-CM | POA: Diagnosis not present

## 2013-12-29 LAB — PREPARE RBC (CROSSMATCH)

## 2013-12-29 LAB — ABO/RH: ABO/RH(D): O POS

## 2013-12-29 NOTE — Procedures (Signed)
[facility]  Procedure Note  Patrick [name] [id number] DOB: 07-Jun-1956 DOA: 12/29/2013   Attending Physician: Darrold Span. Powell   Associated Diagnosis: Iron Deficiency Anemia  Procedure Note: Transfused 2 Units PRBC with no complications   Condition During Procedure: Patient tolerated well. No Complaints.   Condition at Discharge: Tolerated Well. No complaints. VSS. Patient discharged home. Patient in no apparent distress. Instructed patient to seek medical attention if any changes in condition, patient acknowledges. Patient left day hospital with belongings ambulatory via self.    Wendie Simmer, RN  [facility]

## 2013-12-29 NOTE — Discharge Instructions (Signed)
Iron Deficiency Anemia, Adult Anemia is a condition in which there are less red blood cells or hemoglobin in the blood than normal. Hemoglobin is this part of red blood cells that carries oxygen. Iron deficiency anemia is anemia caused by too little iron. It is the most common type of anemia. It may leave you tired and short of breath. CAUSES   Lack of iron in the diet.  Poor absorption of iron, as seen with intestinal disorders.  Intestinal bleeding.  Heavy periods. SIGNS AND SYMPTOMS  Mild anemia may not be noticeable. Symptoms may include:  Fatigue.  Headache.  Pale skin.  Weakness.  Tiredness.  Shortness of breath.  Dizziness.  Cold hands and feet.  Fast or irregular heartbeat. DIAGNOSIS  Diagnosis requires a thorough evaluation and physical exam by your health care provider. Blood tests are generally used to confirm iron deficiency anemia. Additional tests may be done to find the underlying cause of your anemia. These may include:  Testing for blood in the stool (fecal occult blood test).  A procedure to see inside the colon and rectum (colonoscopy).  A procedure to see inside the esophagus and stomach (endoscopy). TREATMENT  Iron deficiency anemia is treated by correcting the cause of the deficiency. Treatment may involve:  Adding iron-rich foods to your diet.  Taking iron supplements. Pregnant or breastfeeding women need to take extra iron, because their normal diet usually does not provide the required amount.  Taking vitamins. Vitamin C improves the absorption of iron. Your health care provider may recommend taking your iron tablets with a glass of orange juice or vitamin C supplement.  Medicines to make heavy menstrual flow lighter.  Surgery. HOME CARE INSTRUCTIONS   Take iron as directed by your health care provider.  If you cannot tolerate taking iron supplements by mouth, talk to your health care provider about taking them through a vein  (intravenously) or an injection into a muscle.  For the best iron absorption, iron supplements should be taken on an empty stomach. If you cannot tolerate them on an empty stomach, you may need to take them with food.  Do not drink milk or take antacids at the same time as your iron supplements. Milk and antacids may interfere with the absorption of iron.  Iron supplements can cause constipation. Make sure to include fiber in your diet to prevent constipation. A stool softener may also be recommended.  Take vitamins as directed by your health care provider.  Eat a diet rich in iron. Foods high in iron include liver, lean beef, whole-grain bread, eggs, dried fruit, and dark green, leafy vegetables. SEEK IMMEDIATE MEDICAL CARE IF:   You faint. If this happens, do not drive. Call your local emergency services (911 in [place] if no other help is available.  You have chest pain.  You feel nauseous or vomit.  You have severe or increased shortness of breath with activity.  You feel weak.  You have a rapid heartbeat.  You have unexplained sweating.  You become lightheaded when getting up from a chair or bed. MAKE SURE YOU:   Understand these instructions.  Will watch your condition.  Will get help right away if you are not doing well or get worse. Document Released: 07/11/2000 Document Revised: 05/04/2013 Document Reviewed: 03/21/2013 [facility] Patient Information 2014 [place].

## 2013-12-30 LAB — TYPE AND SCREEN
ABO/RH(D): O POS
Antibody Screen: NEGATIVE
UNIT DIVISION: 0
Unit division: 0

## 2014-01-10 DIAGNOSIS — N186 End stage renal disease: Secondary | ICD-10-CM | POA: Diagnosis not present

## 2014-01-11 ENCOUNTER — Other Ambulatory Visit: Payer: Self-pay | Admitting: Endocrinology

## 2014-01-11 ENCOUNTER — Telehealth: Payer: Self-pay | Admitting: Endocrinology

## 2014-01-11 MED ORDER — ONETOUCH DELICA LANCETS 33G MISC: Status: DC

## 2014-01-11 MED ORDER — GLUCOSE BLOOD VI STRP: ORAL_STRIP | Status: DC

## 2014-01-11 NOTE — Telephone Encounter (Signed)
Pt call back stated that drug store will be sending paper work to start PA on bother medication request. Pt request a call maybe today on the status, if this is possilble.

## 2014-01-11 NOTE — Telephone Encounter (Signed)
Spoke with Patrick [name]. Ins co wants to know why Patrick [name] testing so many times daily. Reviewed Patrick [name]'s chart. According to Dr Cordelia Pen note, Patrick [name] is to to be testing 2 times daily, varying  Times. Patrick [name]'s rx did not have a dx code attached. Re sent rx refill for test strips and for lancets with testing for 2 times daily along with dx code.

## 2014-01-11 NOTE — Telephone Encounter (Signed)
Pt called request refill for one touch ultra blue test strips and  lancets 33 gauge to be send to [facility]. Please advise.

## 2014-01-11 NOTE — Telephone Encounter (Signed)
Called pt and advised him to call his ins co. Pt is on medicare and aarp. Advised pt to contact aarp and find out which meter and strips are covered. Pt said he will call.

## 2014-01-25 ENCOUNTER — Encounter (HOSPITAL_COMMUNITY)
Admission: RE | Admit: 2014-01-25 | Discharge: 2014-01-25 | Disposition: A | Discharge status: Home / Self Care | Payer: Medicare Other | Source: Ambulatory Visit | Attending: Nephrology | Admitting: Nephrology

## 2014-01-25 DIAGNOSIS — N184 Chronic kidney disease, stage 4 (severe): Secondary | ICD-10-CM | POA: Insufficient documentation

## 2014-01-25 DIAGNOSIS — N2581 Secondary hyperparathyroidism of renal origin: Secondary | ICD-10-CM | POA: Diagnosis not present

## 2014-01-25 DIAGNOSIS — D649 Anemia, unspecified: Secondary | ICD-10-CM | POA: Diagnosis not present

## 2014-01-25 DIAGNOSIS — Z940 Kidney transplant status: Secondary | ICD-10-CM | POA: Diagnosis not present

## 2014-01-25 DIAGNOSIS — D638 Anemia in other chronic diseases classified elsewhere: Secondary | ICD-10-CM | POA: Diagnosis present

## 2014-01-25 LAB — PREPARE RBC (CROSSMATCH)

## 2014-01-25 NOTE — Discharge Instructions (Signed)

## 2014-01-26 ENCOUNTER — Encounter (HOSPITAL_COMMUNITY)
Admission: RE | Admit: 2014-01-26 | Discharge: 2014-01-26 | Disposition: A | Discharge status: Home / Self Care | Payer: Medicare Other | Source: Ambulatory Visit | Attending: Nephrology | Admitting: Nephrology

## 2014-01-26 DIAGNOSIS — N184 Chronic kidney disease, stage 4 (severe): Secondary | ICD-10-CM | POA: Diagnosis not present

## 2014-01-26 NOTE — Progress Notes (Signed)
Patient reports that he did not take the Tylenol or Benedryl at home because he was told it was only if he wanted to take it. [facility] was called. Spoke with [employer]. She called back and stated that patient is correct. No need for premeds at hospital.

## 2014-01-27 LAB — TYPE AND SCREEN
ABO/RH(D): O POS
Antibody Screen: NEGATIVE
Unit division: 0
Unit division: 0

## 2014-02-02 ENCOUNTER — Other Ambulatory Visit (HOSPITAL_COMMUNITY): Payer: Self-pay | Admitting: *Deleted

## 2014-02-02 ENCOUNTER — Encounter (HOSPITAL_COMMUNITY): Payer: Medicare Other

## 2014-02-03 ENCOUNTER — Encounter (HOSPITAL_COMMUNITY)
Admission: RE | Admit: 2014-02-03 | Discharge: 2014-02-03 | Disposition: A | Discharge status: Home / Self Care | Payer: Medicare Other | Source: Ambulatory Visit | Attending: Nephrology | Admitting: Nephrology

## 2014-02-03 DIAGNOSIS — N184 Chronic kidney disease, stage 4 (severe): Secondary | ICD-10-CM | POA: Diagnosis not present

## 2014-02-03 LAB — POCT HEMOGLOBIN-HEMACUE: Hemoglobin: 9.2 g/dL — ABNORMAL LOW (ref 13.0–17.0)

## 2014-02-03 MED ORDER — EPOETIN ALFA 20000 UNIT/ML IJ SOLN
INTRAMUSCULAR | Status: AC
  Administered 2014-02-03: 20000 [IU] via SUBCUTANEOUS
  Filled 2014-02-03: qty 1

## 2014-02-03 MED ORDER — EPOETIN ALFA 20000 UNIT/ML IJ SOLN: 20000.0000 [IU] | INTRAMUSCULAR | Status: DC

## 2014-02-03 NOTE — Discharge Instructions (Signed)

## 2014-02-06 ENCOUNTER — Ambulatory Visit: Payer: Medicare Other | Admitting: Endocrinology

## 2014-02-13 DIAGNOSIS — Z940 Kidney transplant status: Secondary | ICD-10-CM | POA: Diagnosis not present

## 2014-02-17 ENCOUNTER — Encounter (HOSPITAL_COMMUNITY)
Admission: RE | Admit: 2014-02-17 | Discharge: 2014-02-17 | Disposition: A | Discharge status: Home / Self Care | Payer: Medicare Other | Source: Ambulatory Visit | Attending: Nephrology | Admitting: Nephrology

## 2014-02-17 DIAGNOSIS — N184 Chronic kidney disease, stage 4 (severe): Secondary | ICD-10-CM | POA: Diagnosis not present

## 2014-02-17 LAB — POCT HEMOGLOBIN-HEMACUE: Hemoglobin: 8.7 g/dL — ABNORMAL LOW (ref 13.0–17.0)

## 2014-02-17 MED ORDER — EPOETIN ALFA 20000 UNIT/ML IJ SOLN
INTRAMUSCULAR | Status: AC
  Filled 2014-02-17: qty 1

## 2014-02-17 MED ORDER — EPOETIN ALFA 20000 UNIT/ML IJ SOLN
20000.0000 [IU] | INTRAMUSCULAR | Status: DC
  Administered 2014-02-17: 20000 [IU] via SUBCUTANEOUS

## 2014-02-22 DIAGNOSIS — R011 Cardiac murmur, unspecified: Secondary | ICD-10-CM | POA: Diagnosis not present

## 2014-02-22 DIAGNOSIS — Z01812 Encounter for preprocedural laboratory examination: Secondary | ICD-10-CM | POA: Diagnosis not present

## 2014-02-22 DIAGNOSIS — I708 Atherosclerosis of other arteries: Secondary | ICD-10-CM | POA: Diagnosis not present

## 2014-02-22 DIAGNOSIS — I7090 Unspecified atherosclerosis: Secondary | ICD-10-CM | POA: Diagnosis not present

## 2014-02-22 DIAGNOSIS — M79609 Pain in unspecified limb: Secondary | ICD-10-CM | POA: Diagnosis not present

## 2014-02-22 DIAGNOSIS — I739 Peripheral vascular disease, unspecified: Secondary | ICD-10-CM | POA: Diagnosis not present

## 2014-02-22 DIAGNOSIS — Z940 Kidney transplant status: Secondary | ICD-10-CM | POA: Diagnosis not present

## 2014-02-22 DIAGNOSIS — D631 Anemia in chronic kidney disease: Secondary | ICD-10-CM | POA: Diagnosis not present

## 2014-02-22 DIAGNOSIS — Z951 Presence of aortocoronary bypass graft: Secondary | ICD-10-CM | POA: Diagnosis not present

## 2014-02-22 DIAGNOSIS — I209 Angina pectoris, unspecified: Secondary | ICD-10-CM | POA: Diagnosis not present

## 2014-02-22 DIAGNOSIS — I2510 Atherosclerotic heart disease of native coronary artery without angina pectoris: Secondary | ICD-10-CM | POA: Diagnosis not present

## 2014-02-22 DIAGNOSIS — I120 Hypertensive chronic kidney disease with stage 5 chronic kidney disease or end stage renal disease: Secondary | ICD-10-CM | POA: Diagnosis not present

## 2014-02-22 DIAGNOSIS — Z48298 Encounter for aftercare following other organ transplant: Secondary | ICD-10-CM | POA: Diagnosis not present

## 2014-02-22 DIAGNOSIS — N289 Disorder of kidney and ureter, unspecified: Secondary | ICD-10-CM | POA: Diagnosis not present

## 2014-02-22 DIAGNOSIS — N186 End stage renal disease: Secondary | ICD-10-CM | POA: Diagnosis not present

## 2014-02-24 DIAGNOSIS — R011 Cardiac murmur, unspecified: Secondary | ICD-10-CM | POA: Insufficient documentation

## 2014-03-02 ENCOUNTER — Encounter (HOSPITAL_COMMUNITY)
Admission: RE | Admit: 2014-03-02 | Discharge: 2014-03-02 | Disposition: A | Discharge status: Home / Self Care | Payer: Medicare Other | Source: Ambulatory Visit | Attending: Nephrology | Admitting: Nephrology

## 2014-03-02 DIAGNOSIS — N189 Chronic kidney disease, unspecified: Secondary | ICD-10-CM | POA: Insufficient documentation

## 2014-03-02 DIAGNOSIS — N039 Chronic nephritic syndrome with unspecified morphologic changes: Secondary | ICD-10-CM | POA: Diagnosis not present

## 2014-03-02 DIAGNOSIS — D631 Anemia in chronic kidney disease: Secondary | ICD-10-CM | POA: Insufficient documentation

## 2014-03-02 LAB — IRON AND TIBC
IRON: 68 ug/dL (ref 42–135)
Saturation Ratios: 26 % (ref 20–55)
TIBC: 263 ug/dL (ref 215–435)
UIBC: 195 ug/dL (ref 125–400)

## 2014-03-02 LAB — FERRITIN: Ferritin: 1838 ng/mL — ABNORMAL HIGH (ref 22–322)

## 2014-03-02 LAB — POCT HEMOGLOBIN-HEMACUE: Hemoglobin: 8.7 g/dL — ABNORMAL LOW (ref 13.0–17.0)

## 2014-03-02 MED ORDER — EPOETIN ALFA 20000 UNIT/ML IJ SOLN
INTRAMUSCULAR | Status: AC
  Administered 2014-03-02: 20000 [IU] via SUBCUTANEOUS
  Filled 2014-03-02: qty 1

## 2014-03-02 MED ORDER — EPOETIN ALFA 20000 UNIT/ML IJ SOLN
20000.0000 [IU] | INTRAMUSCULAR | Status: DC
  Administered 2014-03-02: 20000 [IU] via SUBCUTANEOUS

## 2014-03-13 ENCOUNTER — Ambulatory Visit (INDEPENDENT_AMBULATORY_CARE_PROVIDER_SITE_OTHER): Payer: Medicare Other | Admitting: Endocrinology

## 2014-03-13 ENCOUNTER — Encounter: Payer: Self-pay | Admitting: Endocrinology

## 2014-03-13 VITALS — BP 118/62 | HR 73 | Temp 97.9°F | Ht 66.5 in | Wt 208.0 lb

## 2014-03-13 DIAGNOSIS — E1029 Type 1 diabetes mellitus with other diabetic kidney complication: Secondary | ICD-10-CM | POA: Diagnosis not present

## 2014-03-13 LAB — HEMOGLOBIN A1C: Hgb A1c MFr Bld: 5.4 % (ref 4.6–6.5)

## 2014-03-13 NOTE — Patient Instructions (Addendum)
check your blood sugar twice a day.  vary the time of day when you check, between before the 3 meals, and at bedtime.  also check if you have symptoms of your blood sugar being too high or too low.  please keep a record of the readings and bring it to your next appointment here.  please call us sooner if your blood sugar goes below 70, or if you have a lot of readings over 200. As the steroids are reduced, you insulin requirements may decrease further, so you will need to carefully watch this.  Please come back for a follow-up appointment in 3 months.  blood tests are being requested for you today.  We'll contact you with results.

## 2014-03-13 NOTE — Progress Notes (Signed)
Subjective:    Patient ID: Patrick [name], male    DOB: 03-29-1956, 58 y.o.   MRN: [id number]  HPI Pt returns for f/u of insulin-requiring DM (dx'ed mid-2014, when he presented with severe hyperglycemia; he has mild neuropathy of the lower extremities, and associated renal failure (transplant in 2009, but renal failure has recurred; he has never had pancreatitis, severe hypoglycemia, or DKA; he takes multiple daily injections).  no cbg record, but states cbg's vary from 120-130.  There is no trend throughout the day.  pt states he feels well in general. Past Medical History  Diagnosis Date  . Hypertension   . Diabetes mellitus without complication   . End-stage renal failure with renal transplant   . Gout   . Impotence of organic origin   . Type I (juvenile type) diabetes mellitus with renal manifestations, not stated as uncontrolled   . Secondary hyperparathyroidism     Past Surgical History  Procedure Laterality Date  . Birth defect heart surgery  1996  . Kidney transplant  2009  . Appendectomy      History   Social History  . Marital Status: Married    Spouse Name: N/A    Number of Children: N/A  . Years of Education: N/A   Occupational History  . Not on file.   Social History Main Topics  . Smoking status: Never Smoker   . Smokeless tobacco: Not on file  . Alcohol Use: No  . Drug Use: No  . Sexual Activity: Not Currently    Birth Control/ Protection: None   Other Topics Concern  . Not on file   Social History Narrative  . No narrative on file    Current Outpatient Prescriptions on File Prior to Visit  Medication Sig Dispense Refill  . amLODipine (NORVASC) 2.5 MG tablet Take 2.5 mg by mouth daily.      Marland Kitchen aspirin 81 MG tablet Take 81 mg by mouth daily.      . calcitRIOL (ROCALTROL) 0.5 MCG capsule Take 3 capsules (1.5 mcg total) by mouth daily.  90 capsule  0  . calcium carbonate (OS-CAL - DOSED IN MG OF ELEMENTAL CALCIUM) 1250 MG tablet Take 2 tablets (1,000  mg of elemental calcium total) by mouth 2 (two) times daily with a meal.  120 tablet  0  . carvedilol (COREG) 25 MG tablet Take 25 mg by mouth 2 (two) times daily with a meal.      . glucose blood (ONE TOUCH ULTRA TEST) test strip Use to test blood glucose 2 times daily as instructed. Dx code: 250.41  100 each  2  . Insulin Detemir (LEVEMIR) 100 UNIT/ML Pen Inject 5 Units into the skin at bedtime.       . magnesium oxide (MAG-OX) 400 (241.3 MG) MG tablet Take 1 tablet (400 mg total) by mouth daily.  10 tablet  0  . mycophenolate (CELLCEPT) 250 MG capsule Take 750 mg by mouth 2 (two) times daily.       Glory Rosebush DELICA LANCETS 99991111 MISC Use to test blood sugar 2 times daily as instructed. Dx code: 250.41  100 each  2  . predniSONE (DELTASONE) 5 MG tablet Take 5 mg by mouth daily.      . tacrolimus (PROGRAF) 1 MG capsule Take 2 mg by mouth 2 (two) times daily.      . tacrolimus (PROGRAF) 5 MG capsule Take 5 mg by mouth 2 (two) times daily.  No current facility-administered medications on file prior to visit.    Allergies  Allergen Reactions  . Iodinated Diagnostic Agents Rash    Other Reaction: burning to mouth, swelling of lips.    No family history on file.  BP 118/62  Pulse 73  Temp(Src) 97.9 F (36.6 C) (Oral)  Ht 5' 6.5" (1.689 m)  Wt 208 lb (94.348 kg)  BMI 33.07 kg/m2  SpO2 93%     Review of Systems He denies hypoglycemia.  He has lost a few lbs.      Objective:   Physical Exam Pulses: dorsalis pedis intact bilat.   Feet: no deformity. normal color and temp.  no edema.  Old healed surgical scar, on the left foot.   Skin:  no ulcer on the feet.   Neuro: sensation is intact to touch on the feet   Lab Results  Component Value Date   HGBA1C 5.4 03/13/2014       Assessment & Plan:  DM: overcontrolled. Weight loss: this helps glycemic control.  i encouraged pt to continue. Renal failure: this contributes to hypoglycemia, so he'll need to carefully check cbg's.     Patient is advised the following: Patient Instructions  check your blood sugar twice a day.  vary the time of day when you check, between before the 3 meals, and at bedtime.  also check if you have symptoms of your blood sugar being too high or too low.  please keep a record of the readings and bring it to your next appointment here.  please call us sooner if your blood sugar goes below 70, or if you have a lot of readings over 200. As the steroids are reduced, you insulin requirements may decrease further, so you will need to carefully watch this.  Please come back for a follow-up appointment in 3 months.  blood tests are being requested for you today.  We'll contact you with results.   Please reduce the humalog to 7 units 3 times a day (just before each meal). i'll see you next time.

## 2014-03-14 ENCOUNTER — Telehealth: Payer: Self-pay | Admitting: Endocrinology

## 2014-03-14 NOTE — Telephone Encounter (Signed)
Pt needs clarification regarding blood sugar and a1c

## 2014-03-14 NOTE — Telephone Encounter (Signed)
Called and spoke with pt about lab results. Discussed note that was sent via my chart. Pt voiced understanding.

## 2014-03-16 ENCOUNTER — Encounter (HOSPITAL_COMMUNITY)
Admission: RE | Admit: 2014-03-16 | Discharge: 2014-03-16 | Disposition: A | Discharge status: Home / Self Care | Payer: Medicare Other | Source: Ambulatory Visit | Attending: Nephrology | Admitting: Nephrology

## 2014-03-16 DIAGNOSIS — N039 Chronic nephritic syndrome with unspecified morphologic changes: Secondary | ICD-10-CM | POA: Diagnosis not present

## 2014-03-16 DIAGNOSIS — D631 Anemia in chronic kidney disease: Secondary | ICD-10-CM | POA: Diagnosis not present

## 2014-03-16 DIAGNOSIS — N189 Chronic kidney disease, unspecified: Secondary | ICD-10-CM | POA: Diagnosis not present

## 2014-03-16 LAB — POCT HEMOGLOBIN-HEMACUE: Hemoglobin: 9.3 g/dL — ABNORMAL LOW (ref 13.0–17.0)

## 2014-03-16 MED ORDER — EPOETIN ALFA 20000 UNIT/ML IJ SOLN: 20000.0000 [IU] | INTRAMUSCULAR | Status: DC

## 2014-03-16 MED ORDER — EPOETIN ALFA 20000 UNIT/ML IJ SOLN
INTRAMUSCULAR | Status: AC
  Administered 2014-03-16: 20000 [IU] via SUBCUTANEOUS
  Filled 2014-03-16: qty 1

## 2014-03-30 ENCOUNTER — Encounter (HOSPITAL_COMMUNITY)
Admission: RE | Admit: 2014-03-30 | Discharge: 2014-03-30 | Disposition: A | Discharge status: Home / Self Care | Payer: Medicare Other | Source: Ambulatory Visit | Attending: Nephrology | Admitting: Nephrology

## 2014-03-30 DIAGNOSIS — D631 Anemia in chronic kidney disease: Secondary | ICD-10-CM | POA: Diagnosis present

## 2014-03-30 DIAGNOSIS — N184 Chronic kidney disease, stage 4 (severe): Secondary | ICD-10-CM | POA: Insufficient documentation

## 2014-03-30 DIAGNOSIS — D509 Iron deficiency anemia, unspecified: Secondary | ICD-10-CM | POA: Insufficient documentation

## 2014-03-30 LAB — POCT HEMOGLOBIN-HEMACUE: HEMOGLOBIN: 9.2 g/dL — AB (ref 13.0–17.0)

## 2014-03-30 LAB — FERRITIN: FERRITIN: 1635 ng/mL — AB (ref 22–322)

## 2014-03-30 MED ORDER — EPOETIN ALFA 20000 UNIT/ML IJ SOLN: 20000.0000 [IU] | INTRAMUSCULAR | Status: DC

## 2014-03-30 MED ORDER — EPOETIN ALFA 20000 UNIT/ML IJ SOLN
INTRAMUSCULAR | Status: AC
  Administered 2014-03-30: 20000 [IU] via SUBCUTANEOUS
  Filled 2014-03-30: qty 1

## 2014-03-31 LAB — IRON AND TIBC
Iron: 97 ug/dL (ref 42–135)
SATURATION RATIOS: 38 % (ref 20–55)
TIBC: 254 ug/dL (ref 215–435)
UIBC: 157 ug/dL (ref 125–400)

## 2014-04-13 ENCOUNTER — Encounter (HOSPITAL_COMMUNITY)
Admission: RE | Admit: 2014-04-13 | Discharge: 2014-04-13 | Disposition: A | Discharge status: Home / Self Care | Payer: Medicare Other | Source: Ambulatory Visit | Attending: Nephrology | Admitting: Nephrology

## 2014-04-13 DIAGNOSIS — D509 Iron deficiency anemia, unspecified: Secondary | ICD-10-CM | POA: Diagnosis not present

## 2014-04-13 DIAGNOSIS — N184 Chronic kidney disease, stage 4 (severe): Secondary | ICD-10-CM | POA: Diagnosis not present

## 2014-04-13 LAB — POCT HEMOGLOBIN-HEMACUE: Hemoglobin: 9.8 g/dL — ABNORMAL LOW (ref 13.0–17.0)

## 2014-04-13 MED ORDER — EPOETIN ALFA 20000 UNIT/ML IJ SOLN
20000.0000 [IU] | INTRAMUSCULAR | Status: DC
  Administered 2014-04-13: 20000 [IU] via SUBCUTANEOUS

## 2014-04-13 MED ORDER — EPOETIN ALFA 20000 UNIT/ML IJ SOLN
INTRAMUSCULAR | Status: AC
  Filled 2014-04-13: qty 1

## 2014-04-26 ENCOUNTER — Telehealth: Payer: Self-pay | Admitting: Endocrinology

## 2014-04-26 MED ORDER — INSULIN DETEMIR 100 UNIT/ML FLEXPEN: 5.0000 [IU] | PEN_INJECTOR | Freq: Every day | SUBCUTANEOUS | Status: DC

## 2014-04-26 NOTE — Telephone Encounter (Signed)
Rx sent to pharmacy per pt's request.  

## 2014-04-26 NOTE — Telephone Encounter (Signed)
Pt needs rx for levemir flex touch pen called into [facility] on [address] please

## 2014-04-27 ENCOUNTER — Encounter (HOSPITAL_COMMUNITY)
Admission: RE | Admit: 2014-04-27 | Discharge: 2014-04-27 | Disposition: A | Discharge status: Home / Self Care | Payer: Medicare Other | Source: Ambulatory Visit | Attending: Nephrology | Admitting: Nephrology

## 2014-04-27 DIAGNOSIS — N184 Chronic kidney disease, stage 4 (severe): Secondary | ICD-10-CM | POA: Diagnosis not present

## 2014-04-27 DIAGNOSIS — D631 Anemia in chronic kidney disease: Secondary | ICD-10-CM | POA: Insufficient documentation

## 2014-04-27 LAB — FERRITIN: Ferritin: 1588 ng/mL — ABNORMAL HIGH (ref 22–322)

## 2014-04-27 LAB — IRON AND TIBC
Iron: 173 ug/dL — ABNORMAL HIGH (ref 42–135)
SATURATION RATIOS: 70 % — AB (ref 20–55)
TIBC: 246 ug/dL (ref 215–435)
UIBC: 73 ug/dL — AB (ref 125–400)

## 2014-04-27 LAB — POCT HEMOGLOBIN-HEMACUE: Hemoglobin: 9.4 g/dL — ABNORMAL LOW (ref 13.0–17.0)

## 2014-04-27 MED ORDER — EPOETIN ALFA 20000 UNIT/ML IJ SOLN
INTRAMUSCULAR | Status: AC
  Administered 2014-04-27: 20000 [IU] via SUBCUTANEOUS
  Filled 2014-04-27: qty 1

## 2014-04-27 MED ORDER — EPOETIN ALFA 20000 UNIT/ML IJ SOLN: 20000.0000 [IU] | INTRAMUSCULAR | Status: DC

## 2014-05-02 DIAGNOSIS — Z940 Kidney transplant status: Secondary | ICD-10-CM | POA: Diagnosis not present

## 2014-05-02 DIAGNOSIS — D649 Anemia, unspecified: Secondary | ICD-10-CM | POA: Diagnosis not present

## 2014-05-02 DIAGNOSIS — N184 Chronic kidney disease, stage 4 (severe): Secondary | ICD-10-CM | POA: Diagnosis not present

## 2014-05-02 DIAGNOSIS — Z992 Dependence on renal dialysis: Secondary | ICD-10-CM | POA: Diagnosis not present

## 2014-05-11 ENCOUNTER — Encounter (HOSPITAL_COMMUNITY)
Admission: RE | Admit: 2014-05-11 | Discharge: 2014-05-11 | Disposition: A | Discharge status: Home / Self Care | Payer: Medicare Other | Source: Ambulatory Visit | Attending: Nephrology | Admitting: Nephrology

## 2014-05-11 DIAGNOSIS — D631 Anemia in chronic kidney disease: Secondary | ICD-10-CM | POA: Diagnosis not present

## 2014-05-11 LAB — POCT HEMOGLOBIN-HEMACUE: HEMOGLOBIN: 8.6 g/dL — AB (ref 13.0–17.0)

## 2014-05-11 MED ORDER — EPOETIN ALFA 20000 UNIT/ML IJ SOLN
INTRAMUSCULAR | Status: AC
  Administered 2014-05-11: 20000 [IU] via SUBCUTANEOUS
  Filled 2014-05-11: qty 1

## 2014-05-11 MED ORDER — EPOETIN ALFA 20000 UNIT/ML IJ SOLN
20000.0000 [IU] | INTRAMUSCULAR | Status: DC
  Administered 2014-05-11: 20000 [IU] via SUBCUTANEOUS

## 2014-05-22 DIAGNOSIS — Z23 Encounter for immunization: Secondary | ICD-10-CM | POA: Diagnosis not present

## 2014-05-25 ENCOUNTER — Encounter (HOSPITAL_COMMUNITY)
Admission: RE | Admit: 2014-05-25 | Discharge: 2014-05-25 | Disposition: A | Discharge status: Home / Self Care | Payer: Medicare Other | Source: Ambulatory Visit | Attending: Pathology | Admitting: Pathology

## 2014-05-25 DIAGNOSIS — D631 Anemia in chronic kidney disease: Secondary | ICD-10-CM | POA: Diagnosis not present

## 2014-05-25 LAB — POCT HEMOGLOBIN-HEMACUE: Hemoglobin: 7.5 g/dL — ABNORMAL LOW (ref 13.0–17.0)

## 2014-05-25 MED ORDER — EPOETIN ALFA 20000 UNIT/ML IJ SOLN: 20000.0000 [IU] | INTRAMUSCULAR | Status: DC

## 2014-05-25 MED ORDER — EPOETIN ALFA 20000 UNIT/ML IJ SOLN
INTRAMUSCULAR | Status: DC
Start: 2014-05-25 — End: 2014-05-25
  Filled 2014-05-25: qty 1

## 2014-05-25 MED ORDER — EPOETIN ALFA 40000 UNIT/ML IJ SOLN
INTRAMUSCULAR | Status: AC
  Administered 2014-05-25: 15:00:00 via SUBCUTANEOUS
  Filled 2014-05-25: qty 1

## 2014-05-30 DIAGNOSIS — D509 Iron deficiency anemia, unspecified: Secondary | ICD-10-CM | POA: Diagnosis not present

## 2014-06-07 ENCOUNTER — Other Ambulatory Visit (HOSPITAL_COMMUNITY): Payer: Self-pay | Admitting: *Deleted

## 2014-06-08 ENCOUNTER — Encounter (HOSPITAL_COMMUNITY)
Admission: RE | Admit: 2014-06-08 | Discharge: 2014-06-08 | Disposition: A | Discharge status: Home / Self Care | Payer: Medicare Other | Source: Ambulatory Visit | Attending: Nephrology | Admitting: Nephrology

## 2014-06-08 DIAGNOSIS — N184 Chronic kidney disease, stage 4 (severe): Secondary | ICD-10-CM | POA: Insufficient documentation

## 2014-06-08 DIAGNOSIS — D631 Anemia in chronic kidney disease: Secondary | ICD-10-CM | POA: Diagnosis not present

## 2014-06-08 LAB — POCT HEMOGLOBIN-HEMACUE: HEMOGLOBIN: 8.2 g/dL — AB (ref 13.0–17.0)

## 2014-06-08 LAB — IRON AND TIBC
Iron: 45 ug/dL (ref 42–135)
Saturation Ratios: 20 % (ref 20–55)
TIBC: 230 ug/dL (ref 215–435)
UIBC: 185 ug/dL (ref 125–400)

## 2014-06-08 LAB — FERRITIN: FERRITIN: 1083 ng/mL — AB (ref 22–322)

## 2014-06-08 MED ORDER — EPOETIN ALFA 40000 UNIT/ML IJ SOLN
40000.0000 [IU] | INTRAMUSCULAR | Status: DC
  Administered 2014-06-08: 40000 [IU] via SUBCUTANEOUS

## 2014-06-08 MED ORDER — EPOETIN ALFA 40000 UNIT/ML IJ SOLN
INTRAMUSCULAR | Status: AC
  Administered 2014-06-08: 40000 [IU] via SUBCUTANEOUS
  Filled 2014-06-08: qty 1

## 2014-06-13 ENCOUNTER — Encounter: Payer: Self-pay | Admitting: Endocrinology

## 2014-06-13 ENCOUNTER — Ambulatory Visit (INDEPENDENT_AMBULATORY_CARE_PROVIDER_SITE_OTHER): Payer: Medicare Other | Admitting: Endocrinology

## 2014-06-13 VITALS — BP 130/70 | HR 91 | Temp 98.2°F | Ht 66.5 in | Wt 206.0 lb

## 2014-06-13 DIAGNOSIS — E1022 Type 1 diabetes mellitus with diabetic chronic kidney disease: Secondary | ICD-10-CM | POA: Diagnosis not present

## 2014-06-13 DIAGNOSIS — N189 Chronic kidney disease, unspecified: Secondary | ICD-10-CM

## 2014-06-13 LAB — HEMOGLOBIN A1C: HEMOGLOBIN A1C: 5.6 % (ref 4.6–6.5)

## 2014-06-13 NOTE — Patient Instructions (Signed)
check your blood sugar twice a day.  vary the time of day when you check, between before the 3 meals, and at bedtime.  also check if you have symptoms of your blood sugar being too high or too low.  please keep a record of the readings and bring it to your next appointment here.  please call us sooner if your blood sugar goes below 70, or if you have a lot of readings over 200. As the steroids are reduced, you insulin requirements may decrease further, so you will need to carefully watch this.  Please come back for a follow-up appointment in 3 months.  blood tests are being requested for you today.  We'll contact you with results.

## 2014-06-13 NOTE — Progress Notes (Signed)
Subjective:    Patient ID: Patrick [name], male    DOB: May 14, 1956, 58 y.o.   MRN: [id number]  HPI  Pt returns for f/u of diabetes mellitus: DM type: Insulin-requiring type 2 Dx'ed: 2014, when he presented with severe hyperglycemia.   Complications: polyneuropathy of the lower extremities and renal failure.  Therapy: insulin since dx DKA: never. Severe hypoglycemia: never. Pancreatitis: never Other: he takes multiple daily injections; he had transplant in 2009, but renal failure has recurred. Interval history: no cbg record, but states cbg's are in the low to mid-100's.  There is no trend throughout the day.  pt states he feels well in general.  He has had no further transfusions, but he takes epogen.   Past Medical History  Diagnosis Date  . Hypertension   . Diabetes mellitus without complication   . End-stage renal failure with renal transplant   . Gout   . Impotence of organic origin   . Type I (juvenile type) diabetes mellitus with renal manifestations, not stated as uncontrolled   . Secondary hyperparathyroidism     Past Surgical History  Procedure Laterality Date  . Birth defect heart surgery  1996  . Kidney transplant  2009  . Appendectomy      History   Social History  . Marital Status: Married    Spouse Name: N/A    Number of Children: N/A  . Years of Education: N/A   Occupational History  . Not on file.   Social History Main Topics  . Smoking status: Never Smoker   . Smokeless tobacco: Not on file  . Alcohol Use: No  . Drug Use: No  . Sexual Activity: Not Currently    Birth Control/ Protection: None   Other Topics Concern  . Not on file   Social History Narrative    Current Outpatient Prescriptions on File Prior to Visit  Medication Sig Dispense Refill  . amLODipine (NORVASC) 2.5 MG tablet Take 2.5 mg by mouth daily.    Marland Kitchen aspirin 81 MG tablet Take 81 mg by mouth daily.    . calcitRIOL (ROCALTROL) 0.5 MCG capsule Take 3 capsules (1.5 mcg total)  by mouth daily. 90 capsule 0  . calcium carbonate (OS-CAL - DOSED IN MG OF ELEMENTAL CALCIUM) 1250 MG tablet Take 2 tablets (1,000 mg of elemental calcium total) by mouth 2 (two) times daily with a meal. 120 tablet 0  . carvedilol (COREG) 25 MG tablet Take 25 mg by mouth 2 (two) times daily with a meal.    . glucose blood (ONE TOUCH ULTRA TEST) test strip Use to test blood glucose 2 times daily as instructed. Dx code: 250.41 100 each 2  . Insulin Detemir (LEVEMIR) 100 UNIT/ML Pen Inject 5 Units into the skin at bedtime. 15 mL 2  . insulin lispro (HUMALOG KWIKPEN) 100 UNIT/ML KiwkPen INJECT 7 UNITS THREE TIMES DAILY JUST BEFORE MEALS    . magnesium oxide (MAG-OX) 400 (241.3 MG) MG tablet Take 1 tablet (400 mg total) by mouth daily. 10 tablet 0  . mycophenolate (CELLCEPT) 250 MG capsule Take 750 mg by mouth 2 (two) times daily.     Glory Rosebush DELICA LANCETS 99991111 MISC Use to test blood sugar 2 times daily as instructed. Dx code: 250.41 100 each 2  . predniSONE (DELTASONE) 5 MG tablet Take 5 mg by mouth daily.    . tacrolimus (PROGRAF) 1 MG capsule Take 2 mg by mouth 2 (two) times daily.    . tacrolimus (PROGRAF)  5 MG capsule Take 5 mg by mouth 2 (two) times daily.     No current facility-administered medications on file prior to visit.    Allergies  Allergen Reactions  . Iodinated Diagnostic Agents Rash    Other Reaction: burning to mouth, swelling of lips.    No family history on file.  BP 130/70 mmHg  Pulse 91  Temp(Src) 98.2 F (36.8 C) (Oral)  Ht 5' 6.5" (1.689 m)  Wt 206 lb (93.441 kg)  BMI 32.76 kg/m2  SpO2 97%  Review of Systems He denies hypoglycemia and n/v.     Objective:   Physical Exam VITAL SIGNS:  See vs page GENERAL: no distress Pulses: dorsalis pedis intact bilat.   Feet: no deformity. normal color and temp. no edema. Old healed surgical scar, on the left foot.   Skin: no ulcer on the feet.   Neuro: sensation is intact to touch on the feet.    Lab  Results  Component Value Date   HGBA1C 5.6 06/13/2014       Assessment & Plan:  DM: overcontrolled Anemia: blood transfusion could have affected last a1c, but insulin had to be reduced for safety reasons.   The similar a1c today.  Suggests that the transfusion had little if any effect on the a1c.  Renal failure: this is also affecting glycemic control--well work around that.  Patient is advised the following: Patient Instructions  check your blood sugar twice a day.  vary the time of day when you check, between before the 3 meals, and at bedtime.  also check if you have symptoms of your blood sugar being too high or too low.  please keep a record of the readings and bring it to your next appointment here.  please call us sooner if your blood sugar goes below 70, or if you have a lot of readings over 200. As the steroids are reduced, you insulin requirements may decrease further, so you will need to carefully watch this.  Please come back for a follow-up appointment in 3 months.  blood tests are being requested for you today.  We'll contact you with results.   addendum: reduce the insulin to 4 units 3 times a day (just before each meal).

## 2014-06-20 DIAGNOSIS — D649 Anemia, unspecified: Secondary | ICD-10-CM | POA: Diagnosis not present

## 2014-06-20 DIAGNOSIS — N184 Chronic kidney disease, stage 4 (severe): Secondary | ICD-10-CM | POA: Diagnosis not present

## 2014-06-20 DIAGNOSIS — Z992 Dependence on renal dialysis: Secondary | ICD-10-CM | POA: Diagnosis not present

## 2014-06-20 DIAGNOSIS — Z940 Kidney transplant status: Secondary | ICD-10-CM | POA: Diagnosis not present

## 2014-06-21 ENCOUNTER — Encounter (HOSPITAL_COMMUNITY)
Admission: RE | Admit: 2014-06-21 | Discharge: 2014-06-21 | Disposition: A | Discharge status: Home / Self Care | Payer: Medicare Other | Source: Ambulatory Visit | Attending: Nephrology | Admitting: Nephrology

## 2014-06-21 DIAGNOSIS — D631 Anemia in chronic kidney disease: Secondary | ICD-10-CM | POA: Diagnosis not present

## 2014-06-21 DIAGNOSIS — N184 Chronic kidney disease, stage 4 (severe): Secondary | ICD-10-CM | POA: Diagnosis not present

## 2014-06-21 MED ORDER — EPOETIN ALFA 40000 UNIT/ML IJ SOLN
40000.0000 [IU] | INTRAMUSCULAR | Status: DC
  Administered 2014-06-21: 40000 [IU] via SUBCUTANEOUS

## 2014-06-21 MED ORDER — EPOETIN ALFA 40000 UNIT/ML IJ SOLN
INTRAMUSCULAR | Status: AC
  Administered 2014-06-21: 40000 [IU] via SUBCUTANEOUS
  Filled 2014-06-21: qty 1

## 2014-07-05 ENCOUNTER — Encounter (HOSPITAL_COMMUNITY)
Admission: RE | Admit: 2014-07-05 | Discharge: 2014-07-05 | Disposition: A | Discharge status: Home / Self Care | Payer: Medicare Other | Source: Ambulatory Visit | Attending: Nephrology | Admitting: Nephrology

## 2014-07-05 DIAGNOSIS — D631 Anemia in chronic kidney disease: Secondary | ICD-10-CM | POA: Insufficient documentation

## 2014-07-05 DIAGNOSIS — N184 Chronic kidney disease, stage 4 (severe): Secondary | ICD-10-CM | POA: Diagnosis not present

## 2014-07-05 LAB — IRON AND TIBC
IRON: 54 ug/dL (ref 42–135)
Saturation Ratios: 22 % (ref 20–55)
TIBC: 246 ug/dL (ref 215–435)
UIBC: 192 ug/dL (ref 125–400)

## 2014-07-05 LAB — FERRITIN: Ferritin: 1214 ng/mL — ABNORMAL HIGH (ref 22–322)

## 2014-07-05 LAB — POCT HEMOGLOBIN-HEMACUE: Hemoglobin: 9.3 g/dL — ABNORMAL LOW (ref 13.0–17.0)

## 2014-07-05 MED ORDER — EPOETIN ALFA 40000 UNIT/ML IJ SOLN
INTRAMUSCULAR | Status: AC
Start: 2014-07-05 — End: 2014-07-05
  Administered 2014-07-05: 40000 [IU] via SUBCUTANEOUS
  Filled 2014-07-05: qty 1

## 2014-07-05 MED ORDER — EPOETIN ALFA 40000 UNIT/ML IJ SOLN
40000.0000 [IU] | INTRAMUSCULAR | Status: DC
  Administered 2014-07-05: 40000 [IU] via SUBCUTANEOUS

## 2014-07-11 ENCOUNTER — Other Ambulatory Visit: Payer: Self-pay | Admitting: *Deleted

## 2014-07-11 DIAGNOSIS — N184 Chronic kidney disease, stage 4 (severe): Secondary | ICD-10-CM

## 2014-07-11 DIAGNOSIS — Z01810 Encounter for preprocedural cardiovascular examination: Secondary | ICD-10-CM

## 2014-07-19 ENCOUNTER — Inpatient Hospital Stay (HOSPITAL_COMMUNITY): Admission: RE | Admit: 2014-07-19 | Payer: Medicare Other | Source: Ambulatory Visit

## 2014-07-26 ENCOUNTER — Encounter (HOSPITAL_COMMUNITY)
Admission: RE | Admit: 2014-07-26 | Discharge: 2014-07-26 | Disposition: A | Discharge status: Home / Self Care | Payer: Medicare Other | Source: Ambulatory Visit | Attending: Nephrology | Admitting: Nephrology

## 2014-07-26 DIAGNOSIS — D631 Anemia in chronic kidney disease: Secondary | ICD-10-CM | POA: Diagnosis not present

## 2014-07-26 LAB — POCT HEMOGLOBIN-HEMACUE: HEMOGLOBIN: 10.2 g/dL — AB (ref 13.0–17.0)

## 2014-07-26 MED ORDER — EPOETIN ALFA 40000 UNIT/ML IJ SOLN
40000.0000 [IU] | INTRAMUSCULAR | Status: DC
  Administered 2014-07-26: 40000 [IU] via SUBCUTANEOUS

## 2014-07-26 MED ORDER — EPOETIN ALFA 40000 UNIT/ML IJ SOLN
INTRAMUSCULAR | Status: AC
  Administered 2014-07-26: 40000 [IU] via SUBCUTANEOUS
  Filled 2014-07-26: qty 1

## 2014-07-28 DIAGNOSIS — Z9289 Personal history of other medical treatment: Secondary | ICD-10-CM

## 2014-07-28 HISTORY — DX: Personal history of other medical treatment: Z92.89

## 2014-08-01 ENCOUNTER — Encounter: Payer: Self-pay | Admitting: Vascular Surgery

## 2014-08-01 ENCOUNTER — Telehealth: Payer: Self-pay | Admitting: Endocrinology

## 2014-08-01 MED ORDER — INSULIN LISPRO 100 UNIT/ML (KWIKPEN): PEN_INJECTOR | SUBCUTANEOUS | Status: DC

## 2014-08-01 MED ORDER — INSULIN PEN NEEDLE 31G X 5 MM MISC: Status: DC

## 2014-08-01 MED ORDER — INSULIN DETEMIR 100 UNIT/ML FLEXPEN: 5.0000 [IU] | PEN_INJECTOR | Freq: Every day | SUBCUTANEOUS | Status: DC

## 2014-08-01 NOTE — Telephone Encounter (Signed)
Patient need refill of Novalog, Levemir 100 units, Ultra fins needles,called into the pharmacy on [address]

## 2014-08-01 NOTE — Telephone Encounter (Signed)
Rx sent to pharmacy   

## 2014-08-02 ENCOUNTER — Encounter: Payer: Self-pay | Admitting: Vascular Surgery

## 2014-08-02 ENCOUNTER — Ambulatory Visit (HOSPITAL_COMMUNITY)
Admission: RE | Admit: 2014-08-02 | Discharge: 2014-08-02 | Disposition: A | Discharge status: Home / Self Care | Payer: Medicare Other | Source: Ambulatory Visit | Attending: Vascular Surgery | Admitting: Vascular Surgery

## 2014-08-02 ENCOUNTER — Ambulatory Visit (INDEPENDENT_AMBULATORY_CARE_PROVIDER_SITE_OTHER): Payer: Medicare Other | Admitting: Vascular Surgery

## 2014-08-02 VITALS — BP 124/75 | HR 68 | Ht 66.5 in | Wt 193.3 lb

## 2014-08-02 DIAGNOSIS — Z940 Kidney transplant status: Secondary | ICD-10-CM | POA: Insufficient documentation

## 2014-08-02 DIAGNOSIS — Z01818 Encounter for other preprocedural examination: Secondary | ICD-10-CM | POA: Diagnosis not present

## 2014-08-02 DIAGNOSIS — N184 Chronic kidney disease, stage 4 (severe): Secondary | ICD-10-CM

## 2014-08-02 DIAGNOSIS — Z01810 Encounter for preprocedural cardiovascular examination: Secondary | ICD-10-CM

## 2014-08-02 DIAGNOSIS — Z992 Dependence on renal dialysis: Secondary | ICD-10-CM | POA: Diagnosis not present

## 2014-08-02 NOTE — Progress Notes (Signed)
Patient ID: Patrick [name], male   DOB: 09/17/1955, 59 y.o.   MRN: [id number]  Reason for Consult: Evaluate for hemodialysis access   Referred by Estanislado Emms, MD  Subjective:     HPI:  Patrick [name] is a 59 y.o. male who has a cadaveric renal transplant which has been gradually failing. However he is currently not on dialysis. He has stage IV chronic kidney disease. He previously had an attempted right radiocephalic AV fistula in [place] which failed. He then had a graft in his right forearm. Subsequently he had a left radiocephalic AV fistula which failed he then had a left forearm graft. All this was done elsewhere. He is sent over for evaluation for new access.  He denies any recent uremic symptoms. Specifically he denies nausea, vomiting, fatigue, anorexia, or palpitations.   Past Medical History  Diagnosis Date  . Hypertension   . Diabetes mellitus without complication   . End-stage renal failure with renal transplant   . Gout   . Impotence of organic origin   . Type I (juvenile type) diabetes mellitus with renal manifestations, not stated as uncontrolled   . Secondary hyperparathyroidism    No family history on file. Past Surgical History  Procedure Laterality Date  . Birth defect heart surgery  1996  . Kidney transplant  2009  . Appendectomy      Short Social History:  History  Substance Use Topics  . Smoking status: Never Smoker   . Smokeless tobacco: Not on file  . Alcohol Use: No    Allergies  Allergen Reactions  . Iodinated Diagnostic Agents Rash    Other Reaction: burning to mouth, swelling of lips.    Current Outpatient Prescriptions  Medication Sig Dispense Refill  . amLODipine (NORVASC) 2.5 MG tablet Take 2.5 mg by mouth daily.    Marland Kitchen aspirin 81 MG tablet Take 81 mg by mouth daily.    . calcitRIOL (ROCALTROL) 0.5 MCG capsule Take 3 capsules (1.5 mcg total) by mouth daily. 90 capsule 0  . calcium carbonate (OS-CAL - DOSED IN MG OF ELEMENTAL  CALCIUM) 1250 MG tablet Take 2 tablets (1,000 mg of elemental calcium total) by mouth 2 (two) times daily with a meal. 120 tablet 0  . carvedilol (COREG) 25 MG tablet Take 25 mg by mouth 2 (two) times daily with a meal.    . Insulin Detemir (LEVEMIR) 100 UNIT/ML Pen Inject 5 Units into the skin at bedtime. 15 mL 2  . insulin lispro (HUMALOG KWIKPEN) 100 UNIT/ML KiwkPen INJECT 7 UNITS THREE TIMES DAILY JUST BEFORE MEALS 15 mL 2  . mycophenolate (CELLCEPT) 250 MG capsule Take 750 mg by mouth 2 (two) times daily.     . predniSONE (DELTASONE) 5 MG tablet Take 5 mg by mouth daily.    . sodium bicarbonate 325 MG tablet Take 325 mg by mouth 2 (two) times daily.    . tacrolimus (PROGRAF) 1 MG capsule Take 2 mg by mouth 2 (two) times daily.    . tacrolimus (PROGRAF) 5 MG capsule Take 5 mg by mouth 2 (two) times daily.    Marland Kitchen glucose blood (ONE TOUCH ULTRA TEST) test strip Use to test blood glucose 2 times daily as instructed. Dx code: 32.41 (Patient not taking: Reported on 08/02/2014) 100 each 2  . Insulin Pen Needle 31G X 5 MM MISC Use to inject insulin 4 times per day. (Patient not taking: Reported on 08/02/2014) 100 each 2  . magnesium oxide (MAG-OX) 400 (241.3  MG) MG tablet Take 1 tablet (400 mg total) by mouth daily. (Patient not taking: Reported on 08/02/2014) 10 tablet 0  . ONETOUCH DELICA LANCETS 99991111 MISC Use to test blood sugar 2 times daily as instructed. Dx code: 56.41 (Patient not taking: Reported on 08/02/2014) 100 each 2   No current facility-administered medications for this visit.    Review of Systems  Constitutional: Negative for chills and fever.  Eyes: Negative for loss of vision.  Respiratory: Negative for cough and wheezing.  Cardiovascular: Negative for chest pain, chest tightness, claudication, dyspnea with exertion, orthopnea and palpitations.  GI: Negative for blood in stool and vomiting.  GU: Negative for dysuria and hematuria.  Musculoskeletal: Negative for leg pain, joint pain and  myalgias.  Skin: Negative for rash and wound.  Neurological: Negative for dizziness and speech difficulty.  Hematologic: Negative for bruises/bleeds easily. Psychiatric: Negative for depressed mood.        Objective:  Objective  Filed Vitals:   08/02/14 1449  BP: 124/75  Pulse: 68  Height: 5' 6.5" (1.689 m)  Weight: 193 lb 4.8 oz (87.68 kg)  SpO2: 98%   Body mass index is 30.74 kg/(m^2).  Physical Exam  Constitutional: He is oriented to person, place, and time. He appears well-developed and well-nourished.  HENT:  Head: Normocephalic and atraumatic.  Neck: Neck supple. No JVD present. No thyromegaly present.  Cardiovascular: Normal rate, regular rhythm and normal heart sounds.  Exam reveals no friction rub.   No murmur heard. Pulses:      Radial pulses are 2+ on the right side, and 2+ on the left side.  Pulmonary/Chest: Breath sounds normal. He has no wheezes. He has no rales.  Abdominal: Soft. Bowel sounds are normal. There is no tenderness.  Musculoskeletal: Normal range of motion. He exhibits no edema.  Lymphadenopathy:    He has no cervical adenopathy.  Neurological: He is alert and oriented to person, place, and time. He has normal strength. No sensory deficit.  Skin: No lesion and no rash noted.  Psychiatric: He has a normal mood and affect.    Data: I have independently interpreted his vein map. His upper arm cephalic vein in both arms is not adequate. His basilic veins in both arms are very small. Thus he does not appear to be a candidate for a fistula.   In November his creatinine clearance was 22.     Assessment/Plan:     STAGE IV CHRONIC KIDNEY DISEASE: I discussed this patient with Dr. Erling Cruz today. Given that he is not a candidate for a fistula, he would prefer [place] to wait before placing a right upper arm graft. If his kidney function continues to deteriorate and I would recommend placement of a right upper arm graft given that he is left-handed. We  have discussed the procedure potential, locations in the office today  And we will be happy to schedule his surgery if his kidney function deteriorates.     Angelia Mould MD [facility]

## 2014-08-10 ENCOUNTER — Encounter (HOSPITAL_COMMUNITY)
Admission: RE | Admit: 2014-08-10 | Discharge: 2014-08-10 | Disposition: A | Discharge status: Home / Self Care | Payer: Medicare Other | Source: Ambulatory Visit | Attending: Nephrology | Admitting: Nephrology

## 2014-08-10 DIAGNOSIS — N184 Chronic kidney disease, stage 4 (severe): Secondary | ICD-10-CM | POA: Insufficient documentation

## 2014-08-10 DIAGNOSIS — D631 Anemia in chronic kidney disease: Secondary | ICD-10-CM | POA: Insufficient documentation

## 2014-08-10 LAB — FERRITIN: Ferritin: 1080 ng/mL — ABNORMAL HIGH (ref 22–322)

## 2014-08-10 LAB — IRON AND TIBC
Iron: 140 ug/dL — ABNORMAL HIGH (ref 42–165)
Saturation Ratios: 57 % — ABNORMAL HIGH (ref 20–55)
TIBC: 245 ug/dL (ref 215–435)
UIBC: 105 ug/dL — ABNORMAL LOW (ref 125–400)

## 2014-08-10 LAB — POCT HEMOGLOBIN-HEMACUE: HEMOGLOBIN: 10.2 g/dL — AB (ref 13.0–17.0)

## 2014-08-10 MED ORDER — EPOETIN ALFA 40000 UNIT/ML IJ SOLN
INTRAMUSCULAR | Status: AC
  Filled 2014-08-10: qty 1

## 2014-08-10 MED ORDER — EPOETIN ALFA 40000 UNIT/ML IJ SOLN
40000.0000 [IU] | INTRAMUSCULAR | Status: DC
  Administered 2014-08-10: 40000 [IU] via SUBCUTANEOUS

## 2014-08-24 ENCOUNTER — Encounter (HOSPITAL_COMMUNITY)
Admission: RE | Admit: 2014-08-24 | Discharge: 2014-08-24 | Disposition: A | Discharge status: Home / Self Care | Payer: Medicare Other | Source: Ambulatory Visit | Attending: Nephrology | Admitting: Nephrology

## 2014-08-24 DIAGNOSIS — D631 Anemia in chronic kidney disease: Secondary | ICD-10-CM | POA: Diagnosis not present

## 2014-08-24 DIAGNOSIS — N184 Chronic kidney disease, stage 4 (severe): Secondary | ICD-10-CM | POA: Diagnosis not present

## 2014-08-24 MED ORDER — EPOETIN ALFA 40000 UNIT/ML IJ SOLN
INTRAMUSCULAR | Status: AC
  Filled 2014-08-24: qty 1

## 2014-08-24 MED ORDER — EPOETIN ALFA 40000 UNIT/ML IJ SOLN
40000.0000 [IU] | INTRAMUSCULAR | Status: DC
  Administered 2014-08-24: 40000 [IU] via SUBCUTANEOUS

## 2014-08-25 LAB — POCT HEMOGLOBIN-HEMACUE: Hemoglobin: 9.9 g/dL — ABNORMAL LOW (ref 13.0–17.0)

## 2014-08-28 ENCOUNTER — Ambulatory Visit (INDEPENDENT_AMBULATORY_CARE_PROVIDER_SITE_OTHER): Payer: Medicare Other | Admitting: Endocrinology

## 2014-08-28 ENCOUNTER — Encounter: Payer: Self-pay | Admitting: Endocrinology

## 2014-08-28 VITALS — BP 132/78 | HR 111 | Temp 97.9°F | Ht 66.5 in | Wt 182.0 lb

## 2014-08-28 DIAGNOSIS — R634 Abnormal weight loss: Secondary | ICD-10-CM | POA: Diagnosis not present

## 2014-08-28 DIAGNOSIS — E0800 Diabetes mellitus due to underlying condition with hyperosmolarity without nonketotic hyperglycemic-hyperosmolar coma (NKHHC): Secondary | ICD-10-CM | POA: Diagnosis not present

## 2014-08-28 DIAGNOSIS — Z951 Presence of aortocoronary bypass graft: Secondary | ICD-10-CM | POA: Diagnosis not present

## 2014-08-28 DIAGNOSIS — Z940 Kidney transplant status: Secondary | ICD-10-CM | POA: Diagnosis not present

## 2014-08-28 DIAGNOSIS — N189 Chronic kidney disease, unspecified: Secondary | ICD-10-CM | POA: Diagnosis not present

## 2014-08-28 DIAGNOSIS — N429 Disorder of prostate, unspecified: Secondary | ICD-10-CM | POA: Diagnosis not present

## 2014-08-28 DIAGNOSIS — E1022 Type 1 diabetes mellitus with diabetic chronic kidney disease: Secondary | ICD-10-CM | POA: Diagnosis not present

## 2014-08-28 NOTE — Progress Notes (Signed)
Subjective:    Patient ID: Patrick [name], male    DOB: Dec 01, 1955, 59 y.o.   MRN: [id number]  HPI Pt returns for f/u of diabetes mellitus: DM type: Insulin-requiring type 2 Dx'ed: 2014, when he presented with severe hyperglycemia.   Complications: polyneuropathy of the lower extremities and renal failure.  Therapy: insulin since dx  DKA: never. Severe hypoglycemia: never. Pancreatitis: never Other: he takes multiple daily injections; he had transplant in 2009, but renal failure has recurred. Interval history: pt states he feels well in general. no cbg record, but states cbg's are well-controlled.  He denies hypoglycemia Past Medical History  Diagnosis Date  . Hypertension   . Diabetes mellitus without complication   . End-stage renal failure with renal transplant   . Gout   . Impotence of organic origin   . Type I (juvenile type) diabetes mellitus with renal manifestations, not stated as uncontrolled   . Secondary hyperparathyroidism     Past Surgical History  Procedure Laterality Date  . Birth defect heart surgery  1996  . Kidney transplant  2009  . Appendectomy      History   Social History  . Marital Status: Married    Spouse Name: N/A    Number of Children: N/A  . Years of Education: N/A   Occupational History  . Not on file.   Social History Main Topics  . Smoking status: Never Smoker   . Smokeless tobacco: Not on file  . Alcohol Use: No  . Drug Use: No  . Sexual Activity: Not Currently    Birth Control/ Protection: None   Other Topics Concern  . Not on file   Social History Narrative    Current Outpatient Prescriptions on File Prior to Visit  Medication Sig Dispense Refill  . amLODipine (NORVASC) 2.5 MG tablet Take 2.5 mg by mouth daily.    Marland Kitchen aspirin 81 MG tablet Take 81 mg by mouth daily.    . calcitRIOL (ROCALTROL) 0.5 MCG capsule Take 3 capsules (1.5 mcg total) by mouth daily. 90 capsule 0  . calcium carbonate (OS-CAL - DOSED IN MG OF ELEMENTAL  CALCIUM) 1250 MG tablet Take 2 tablets (1,000 mg of elemental calcium total) by mouth 2 (two) times daily with a meal. 120 tablet 0  . carvedilol (COREG) 25 MG tablet Take 25 mg by mouth 2 (two) times daily with a meal.    . glucose blood (ONE TOUCH ULTRA TEST) test strip Use to test blood glucose 2 times daily as instructed. Dx code: 250.41 100 each 2  . Insulin Detemir (LEVEMIR) 100 UNIT/ML Pen Inject 5 Units into the skin at bedtime. 15 mL 2  . Insulin Pen Needle 31G X 5 MM MISC Use to inject insulin 4 times per day. 100 each 2  . magnesium oxide (MAG-OX) 400 (241.3 MG) MG tablet Take 1 tablet (400 mg total) by mouth daily. 10 tablet 0  . mycophenolate (CELLCEPT) 250 MG capsule Take 750 mg by mouth 2 (two) times daily.     Glory Rosebush DELICA LANCETS [id number] MISC Use to test blood sugar 2 times daily as instructed. Dx code: 250.41 100 each 2  . predniSONE (DELTASONE) 5 MG tablet Take 5 mg by mouth daily.    . sodium bicarbonate 325 MG tablet Take 325 mg by mouth 2 (two) times daily.    . tacrolimus (PROGRAF) 1 MG capsule Take 2 mg by mouth 2 (two) times daily.    . tacrolimus (PROGRAF) 5 MG capsule  Take 5 mg by mouth 2 (two) times daily.     No current facility-administered medications on file prior to visit.    Allergies  Allergen Reactions  . Iodinated Diagnostic Agents Rash    Other Reaction: burning to mouth, swelling of lips.    No family history on file.  BP 132/78 mmHg  Pulse 111  Temp(Src) 97.9 F (36.6 C) (Oral)  Ht 5' 6.5" (1.689 m)  Wt 182 lb (82.555 kg)  BMI 28.94 kg/m2  SpO2 94%    Review of Systems He has lost weight.  He has nausea but no vomiting.    Objective:   Physical Exam VITAL SIGNS:  See vs page GENERAL: no distress Pulses: dorsalis pedis intact bilat.   Feet: no deformity. normal color and temp. no edema. Old healed surgical scar, on the left foot.   Skin: no ulcer on the feet.   Neuro: sensation is intact to touch on the feet.  Lab Results    Component Value Date   HGBA1C 5.6 06/13/2014       Assessment & Plan:  DM: possibly overcontrolled Weight loss: in this setting, he may be able to reduce insulin further, or even go off altogether. Renal failure: this also reduces insulin requirement. Noncompliance with cbg recording: I'll work around this as best I can.   Patient is advised the following: Patient Instructions  check your blood sugar twice a day.  vary the time of day when you check, between before the 3 meals, and at bedtime.  also check if you have symptoms of your blood sugar being too high or too low.  please keep a record of the readings and bring it to your next appointment here.  please call us sooner if your blood sugar goes below 70, or if you have a lot of readings over 200.    Please come back for a follow-up appointment in 3 months.  blood tests are being requested for you today.  We'll contact you with results. If the blood sugar is good again, we can stop the levemir.

## 2014-08-28 NOTE — Patient Instructions (Addendum)
check your blood sugar twice a day.  vary the time of day when you check, between before the 3 meals, and at bedtime.  also check if you have symptoms of your blood sugar being too high or too low.  please keep a record of the readings and bring it to your next appointment here.  please call us sooner if your blood sugar goes below 70, or if you have a lot of readings over 200.    Please come back for a follow-up appointment in 3 months.  blood tests are being requested for you today.  We'll contact you with results. If the blood sugar is good again, we can stop the levemir.

## 2014-09-05 DIAGNOSIS — I10 Essential (primary) hypertension: Secondary | ICD-10-CM | POA: Diagnosis not present

## 2014-09-06 ENCOUNTER — Other Ambulatory Visit (HOSPITAL_COMMUNITY): Payer: Self-pay | Admitting: *Deleted

## 2014-09-07 ENCOUNTER — Encounter (HOSPITAL_COMMUNITY)
Admission: RE | Admit: 2014-09-07 | Discharge: 2014-09-07 | Disposition: A | Discharge status: Home / Self Care | Payer: Medicare Other | Source: Ambulatory Visit | Attending: Nephrology | Admitting: Nephrology

## 2014-09-07 DIAGNOSIS — N184 Chronic kidney disease, stage 4 (severe): Secondary | ICD-10-CM | POA: Diagnosis not present

## 2014-09-07 DIAGNOSIS — D631 Anemia in chronic kidney disease: Secondary | ICD-10-CM | POA: Insufficient documentation

## 2014-09-07 LAB — POCT HEMOGLOBIN-HEMACUE: HEMOGLOBIN: 8.3 g/dL — AB (ref 13.0–17.0)

## 2014-09-07 MED ORDER — EPOETIN ALFA 40000 UNIT/ML IJ SOLN
40000.0000 [IU] | INTRAMUSCULAR | Status: DC
  Administered 2014-09-07: 40000 [IU] via SUBCUTANEOUS

## 2014-09-08 DIAGNOSIS — D509 Iron deficiency anemia, unspecified: Secondary | ICD-10-CM | POA: Diagnosis not present

## 2014-09-08 LAB — IRON AND TIBC: Iron: 169 ug/dL — ABNORMAL HIGH (ref 42–165)

## 2014-09-08 LAB — FERRITIN: Ferritin: 1064 ng/mL — ABNORMAL HIGH (ref 22–322)

## 2014-09-08 MED ORDER — EPOETIN ALFA 40000 UNIT/ML IJ SOLN
INTRAMUSCULAR | Status: AC
  Filled 2014-09-08: qty 1

## 2014-09-13 ENCOUNTER — Ambulatory Visit: Payer: Medicare Other | Admitting: Endocrinology

## 2014-09-14 ENCOUNTER — Encounter (HOSPITAL_COMMUNITY): Payer: Self-pay | Admitting: *Deleted

## 2014-09-14 ENCOUNTER — Inpatient Hospital Stay (HOSPITAL_COMMUNITY)
Admission: EM | Admit: 2014-09-14 | Discharge: 2014-09-19 | DRG: 673 | Disposition: A | Discharge status: Home / Self Care | Payer: Medicare Other | Attending: Internal Medicine | Admitting: Internal Medicine

## 2014-09-14 ENCOUNTER — Emergency Department (HOSPITAL_COMMUNITY): Payer: Medicare Other

## 2014-09-14 DIAGNOSIS — D631 Anemia in chronic kidney disease: Secondary | ICD-10-CM | POA: Diagnosis present

## 2014-09-14 DIAGNOSIS — T8612 Kidney transplant failure: Secondary | ICD-10-CM | POA: Diagnosis present

## 2014-09-14 DIAGNOSIS — Z940 Kidney transplant status: Secondary | ICD-10-CM

## 2014-09-14 DIAGNOSIS — T8611 Kidney transplant rejection: Secondary | ICD-10-CM | POA: Diagnosis present

## 2014-09-14 DIAGNOSIS — E43 Unspecified severe protein-calorie malnutrition: Secondary | ICD-10-CM | POA: Insufficient documentation

## 2014-09-14 DIAGNOSIS — R392 Extrarenal uremia: Secondary | ICD-10-CM | POA: Diagnosis not present

## 2014-09-14 DIAGNOSIS — N184 Chronic kidney disease, stage 4 (severe): Secondary | ICD-10-CM | POA: Diagnosis not present

## 2014-09-14 DIAGNOSIS — E119 Type 2 diabetes mellitus without complications: Secondary | ICD-10-CM | POA: Diagnosis not present

## 2014-09-14 DIAGNOSIS — Z7982 Long term (current) use of aspirin: Secondary | ICD-10-CM

## 2014-09-14 DIAGNOSIS — N185 Chronic kidney disease, stage 5: Secondary | ICD-10-CM | POA: Diagnosis not present

## 2014-09-14 DIAGNOSIS — R634 Abnormal weight loss: Secondary | ICD-10-CM | POA: Diagnosis not present

## 2014-09-14 DIAGNOSIS — Z794 Long term (current) use of insulin: Secondary | ICD-10-CM

## 2014-09-14 DIAGNOSIS — Z79899 Other long term (current) drug therapy: Secondary | ICD-10-CM | POA: Diagnosis not present

## 2014-09-14 DIAGNOSIS — Y830 Surgical operation with transplant of whole organ as the cause of abnormal reaction of the patient, or of later complication, without mention of misadventure at the time of the procedure: Secondary | ICD-10-CM | POA: Diagnosis present

## 2014-09-14 DIAGNOSIS — N189 Chronic kidney disease, unspecified: Secondary | ICD-10-CM

## 2014-09-14 DIAGNOSIS — I120 Hypertensive chronic kidney disease with stage 5 chronic kidney disease or end stage renal disease: Secondary | ICD-10-CM | POA: Diagnosis present

## 2014-09-14 DIAGNOSIS — N186 End stage renal disease: Secondary | ICD-10-CM | POA: Diagnosis present

## 2014-09-14 DIAGNOSIS — Z7952 Long term (current) use of systemic steroids: Secondary | ICD-10-CM

## 2014-09-14 DIAGNOSIS — E872 Acidosis, unspecified: Secondary | ICD-10-CM | POA: Diagnosis present

## 2014-09-14 DIAGNOSIS — N2581 Secondary hyperparathyroidism of renal origin: Secondary | ICD-10-CM | POA: Diagnosis present

## 2014-09-14 DIAGNOSIS — I10 Essential (primary) hypertension: Secondary | ICD-10-CM | POA: Diagnosis present

## 2014-09-14 DIAGNOSIS — R0602 Shortness of breath: Secondary | ICD-10-CM | POA: Diagnosis not present

## 2014-09-14 DIAGNOSIS — Z992 Dependence on renal dialysis: Secondary | ICD-10-CM | POA: Diagnosis not present

## 2014-09-14 DIAGNOSIS — Z6828 Body mass index (BMI) 28.0-28.9, adult: Secondary | ICD-10-CM | POA: Diagnosis not present

## 2014-09-14 DIAGNOSIS — D649 Anemia, unspecified: Secondary | ICD-10-CM | POA: Diagnosis not present

## 2014-09-14 DIAGNOSIS — T8249XA Other complication of vascular dialysis catheter, initial encounter: Secondary | ICD-10-CM | POA: Diagnosis not present

## 2014-09-14 DIAGNOSIS — Z4901 Encounter for fitting and adjustment of extracorporeal dialysis catheter: Secondary | ICD-10-CM | POA: Diagnosis not present

## 2014-09-14 DIAGNOSIS — E1022 Type 1 diabetes mellitus with diabetic chronic kidney disease: Secondary | ICD-10-CM | POA: Diagnosis present

## 2014-09-14 DIAGNOSIS — E876 Hypokalemia: Secondary | ICD-10-CM | POA: Diagnosis present

## 2014-09-14 DIAGNOSIS — T8619 Other complication of kidney transplant: Secondary | ICD-10-CM

## 2014-09-14 LAB — COMPREHENSIVE METABOLIC PANEL
ALT: 6 U/L (ref 0–53)
AST: 8 U/L (ref 0–37)
Albumin: 3.7 g/dL (ref 3.5–5.2)
Alkaline Phosphatase: 65 U/L (ref 39–117)
Anion gap: 9 (ref 5–15)
BUN: 70 mg/dL — ABNORMAL HIGH (ref 6–23)
CO2: 6 mmol/L — CL (ref 19–32)
Calcium: 8.1 mg/dL — ABNORMAL LOW (ref 8.4–10.5)
Chloride: 124 mmol/L — ABNORMAL HIGH (ref 96–112)
Creatinine, Ser: 5.04 mg/dL — ABNORMAL HIGH (ref 0.50–1.35)
GFR calc Af Amer: 13 mL/min — ABNORMAL LOW (ref >90)
GFR calc non Af Amer: 11 mL/min — ABNORMAL LOW (ref >90)
Glucose, Bld: 100 mg/dL — ABNORMAL HIGH (ref 70–99)
Potassium: 4.2 mmol/L (ref 3.5–5.1)
Sodium: 139 mmol/L (ref 135–145)
Total Bilirubin: 0.5 mg/dL (ref 0.3–1.2)
Total Protein: 6.2 g/dL (ref 6.0–8.3)

## 2014-09-14 LAB — CBC WITH DIFFERENTIAL/PLATELET
Basophils Absolute: 0 10*3/uL (ref 0.0–0.1)
Basophils Relative: 0 % (ref 0–1)
Eosinophils Absolute: 0.1 10*3/uL (ref 0.0–0.7)
Eosinophils Relative: 2 % (ref 0–5)
HCT: 22.4 % — ABNORMAL LOW (ref 39.0–52.0)
Hemoglobin: 7.1 g/dL — ABNORMAL LOW (ref 13.0–17.0)
Lymphocytes Relative: 14 % (ref 12–46)
Lymphs Abs: 0.8 10*3/uL (ref 0.7–4.0)
MCH: 27.5 pg (ref 26.0–34.0)
MCHC: 31.7 g/dL (ref 30.0–36.0)
MCV: 86.8 fL (ref 78.0–100.0)
Monocytes Absolute: 0.5 10*3/uL (ref 0.1–1.0)
Monocytes Relative: 8 % (ref 3–12)
Neutro Abs: 4.3 10*3/uL (ref 1.7–7.7)
Neutrophils Relative %: 76 % (ref 43–77)
Platelets: 169 10*3/uL (ref 150–400)
RBC: 2.58 MIL/uL — ABNORMAL LOW (ref 4.22–5.81)
RDW: 18.3 % — ABNORMAL HIGH (ref 11.5–15.5)
WBC: 5.6 10*3/uL (ref 4.0–10.5)

## 2014-09-14 LAB — GLUCOSE, CAPILLARY: GLUCOSE-CAPILLARY: 97 mg/dL (ref 70–99)

## 2014-09-14 LAB — TROPONIN I: Troponin I: <0.03 ng/mL (ref <0.031)

## 2014-09-14 MED ORDER — HEPARIN SODIUM (PORCINE) 5000 UNIT/ML IJ SOLN
5000.0000 [IU] | Freq: Three times a day (TID) | INTRAMUSCULAR | Status: DC
  Filled 2014-09-14 (×15): qty 1

## 2014-09-14 MED ORDER — CALCIUM CARBONATE 1250 (500 CA) MG PO TABS
2.0000 | ORAL_TABLET | Freq: Two times a day (BID) | ORAL | Status: DC
  Administered 2014-09-15 – 2014-09-19 (×7): 1000 mg via ORAL
  Filled 2014-09-14 (×12): qty 2

## 2014-09-14 MED ORDER — CITRIC ACID-SODIUM CITRATE 334-500 MG/5ML PO SOLN
30.0000 mL | Freq: Three times a day (TID) | ORAL | Status: AC
  Administered 2014-09-14: 30 mL via ORAL
  Filled 2014-09-14 (×4): qty 30

## 2014-09-14 MED ORDER — DARBEPOETIN ALFA 200 MCG/0.4ML IJ SOSY
200.0000 ug | PREFILLED_SYRINGE | INTRAMUSCULAR | Status: DC
  Administered 2014-09-15: 200 ug via INTRAVENOUS
  Filled 2014-09-14: qty 0.4

## 2014-09-14 MED ORDER — ASPIRIN EC 81 MG PO TBEC
81.0000 mg | DELAYED_RELEASE_TABLET | Freq: Every day | ORAL | Status: DC
  Administered 2014-09-14 – 2014-09-19 (×6): 81 mg via ORAL
  Filled 2014-09-14 (×6): qty 1

## 2014-09-14 MED ORDER — INSULIN DETEMIR 100 UNIT/ML ~~LOC~~ SOLN
5.0000 [IU] | Freq: Every day | SUBCUTANEOUS | Status: DC
  Administered 2014-09-14 – 2014-09-18 (×5): 5 [IU] via SUBCUTANEOUS
  Filled 2014-09-14 (×7): qty 0.05

## 2014-09-14 MED ORDER — TACROLIMUS 1 MG PO CAPS
5.0000 mg | ORAL_CAPSULE | Freq: Two times a day (BID) | ORAL | Status: DC
  Administered 2014-09-14 – 2014-09-19 (×10): 5 mg via ORAL
  Filled 2014-09-14 (×13): qty 5

## 2014-09-14 MED ORDER — SODIUM CHLORIDE 0.9 % IJ SOLN
3.0000 mL | Freq: Two times a day (BID) | INTRAMUSCULAR | Status: DC
  Administered 2014-09-14 – 2014-09-19 (×10): 3 mL via INTRAVENOUS

## 2014-09-14 MED ORDER — CARVEDILOL 25 MG PO TABS: 25.0000 mg | ORAL_TABLET | Freq: Two times a day (BID) | ORAL | Status: DC

## 2014-09-14 MED ORDER — PREDNISONE 5 MG PO TABS
5.0000 mg | ORAL_TABLET | Freq: Every day | ORAL | Status: DC
  Administered 2014-09-15 – 2014-09-19 (×4): 5 mg via ORAL
  Filled 2014-09-14 (×7): qty 1

## 2014-09-14 MED ORDER — INSULIN DETEMIR 100 UNIT/ML FLEXPEN: 5.0000 [IU] | PEN_INJECTOR | Freq: Every day | SUBCUTANEOUS | Status: DC

## 2014-09-14 MED ORDER — AMLODIPINE BESYLATE 5 MG PO TABS: 2.5000 mg | ORAL_TABLET | Freq: Every day | ORAL | Status: DC

## 2014-09-14 MED ORDER — INSULIN ASPART 100 UNIT/ML ~~LOC~~ SOLN
4.0000 [IU] | Freq: Three times a day (TID) | SUBCUTANEOUS | Status: DC
  Administered 2014-09-16 – 2014-09-19 (×6): 4 [IU] via SUBCUTANEOUS

## 2014-09-14 MED ORDER — INSULIN LISPRO 100 UNIT/ML (KWIKPEN): 4.0000 [IU] | PEN_INJECTOR | Freq: Three times a day (TID) | SUBCUTANEOUS | Status: DC

## 2014-09-14 MED ORDER — PREDNISONE 20 MG PO TABS: 10.0000 mg | ORAL_TABLET | Freq: Every day | ORAL | Status: DC

## 2014-09-14 MED ORDER — SODIUM BICARBONATE 650 MG PO TABS
325.0000 mg | ORAL_TABLET | Freq: Two times a day (BID) | ORAL | Status: DC
  Administered 2014-09-14 – 2014-09-16 (×4): 325 mg via ORAL
  Filled 2014-09-14 (×5): qty 0.5

## 2014-09-14 MED ORDER — CALCITRIOL 0.5 MCG PO CAPS
1.5000 ug | ORAL_CAPSULE | Freq: Every day | ORAL | Status: DC
  Administered 2014-09-14 – 2014-09-19 (×6): 1.5 ug via ORAL
  Filled 2014-09-14 (×6): qty 3

## 2014-09-14 MED ORDER — MYCOPHENOLATE MOFETIL 250 MG PO CAPS
500.0000 mg | ORAL_CAPSULE | Freq: Two times a day (BID) | ORAL | Status: DC
  Administered 2014-09-14 – 2014-09-15 (×2): 500 mg via ORAL
  Filled 2014-09-14 (×3): qty 2

## 2014-09-14 NOTE — ED Notes (Signed)
Started IV on patient.  Tolerated well.  Meal provided.  Waiting for hospitalist to see patient at this time.  Encouraged to call for assistance as needed.

## 2014-09-14 NOTE — H&P (Signed)
[facility] History and Physical  [name] [id number] DOB: 1956-07-13 DOA: 09/14/2014  Referring physician: EDP PCP: Pcp Not In System   Chief Complaint: Kidney transplant failure   HPI: Patrick [name] is a 59 y.o. male with history of ESRD s/p DDKT in 2008.  Patient is sent in from his nephrologists office with transplant failure and rejection.  Patient had acute rejection in April 2015 with creatinine peak at about 8 that improved to 3.45 in May.  In November creatinine was 3.39 and prograf was undetectable, patient did not go for repeat prograf level.  Since November patient has lost 30 lbs, become increasingly dyspnic with exertion, has developed tachypnea at rest.  Lab work up today at his nephrologists office demonstrate bicarb of 8, creat of 4.85, HGB 7.5.  These labs are confirmed today on ED work up, also his BUN is 70.  Review of Systems: Systems reviewed.  As above, otherwise negative  Past Medical History  Diagnosis Date  . Hypertension   . Diabetes mellitus without complication   . End-stage renal failure with renal transplant   . Gout   . Impotence of organic origin   . Type I (juvenile type) diabetes mellitus with renal manifestations, not stated as uncontrolled   . Secondary hyperparathyroidism    Past Surgical History  Procedure Laterality Date  . Birth defect heart surgery  1996  . Kidney transplant  2009  . Appendectomy     Social History:  reports that he has never smoked. He does not have any smokeless tobacco history on file. He reports that he does not drink alcohol or use illicit drugs.  Allergies  Allergen Reactions  . Iodinated Diagnostic Agents Rash    Other Reaction: burning to mouth, swelling of lips.    No family history on file.   Prior to Admission medications   Medication Sig Start Date End Date Taking? Authorizing Provider  amLODipine (NORVASC) 2.5 MG tablet Take 2.5 mg by mouth daily.   Yes Historical Provider, MD  aspirin 81  MG tablet Take 81 mg by mouth daily.   Yes Historical Provider, MD  calcitRIOL (ROCALTROL) 0.5 MCG capsule Take 3 capsules (1.5 mcg total) by mouth daily. 11/30/13  Yes Modena Jansky, MD  calcium carbonate (OS-CAL - DOSED IN MG OF ELEMENTAL CALCIUM) 1250 MG tablet Take 2 tablets (1,000 mg of elemental calcium total) by mouth 2 (two) times daily with a meal. 11/29/13  Yes Modena Jansky, MD  carvedilol (COREG) 25 MG tablet Take 25 mg by mouth 2 (two) times daily with a meal.   Yes Historical Provider, MD  glucose blood (ONE TOUCH ULTRA TEST) test strip Use to test blood glucose 2 times daily as instructed. Dx code: 250.41 01/11/14   Renato Shin, MD  Insulin Detemir (LEVEMIR) 100 UNIT/ML Pen Inject 5 Units into the skin at bedtime. 08/01/14  Yes Renato Shin, MD  insulin lispro (HUMALOG) 100 UNIT/ML KiwkPen Inject 4 Units into the skin 3 (three) times daily with meals.   Yes Historical Provider, MD  Insulin Pen Needle 31G X 5 MM MISC Use to inject insulin 4 times per day. 08/01/14   Renato Shin, MD  mycophenolate (CELLCEPT) 250 MG capsule Take 750 mg by mouth 2 (two) times daily.    Yes Historical Provider, MD  [facility] DELICA LANCETS [id number] MISC Use to test blood sugar 2 times daily as instructed. Dx code: 250.41 01/11/14   Renato Shin, MD  potassium chloride SA (K-DUR,KLOR-CON) 20 MEQ  tablet Take 20 mEq by mouth 2 (two) times daily.   Yes Historical Provider, MD  predniSONE (DELTASONE) 5 MG tablet Take 5 mg by mouth daily.   Yes Historical Provider, MD  sodium bicarbonate 325 MG tablet Take 325 mg by mouth 2 (two) times daily.   Yes Historical Provider, MD  tacrolimus (PROGRAF) 1 MG capsule Take 2 mg by mouth 2 (two) times daily.   Yes Historical Provider, MD  tacrolimus (PROGRAF) 5 MG capsule Take 5 mg by mouth 2 (two) times daily.   Yes Historical Provider, MD   Physical Exam: Filed Vitals:   09/14/14 2100  BP: 115/62  Pulse: 71  Temp:   Resp: 18    BP 115/62 mmHg  Pulse 71  Temp(Src) 98.3 F  (36.8 C) (Oral)  Resp 18  Ht 5' 6.5" (1.689 m)  Wt 82.555 kg (182 lb)  BMI 28.94 kg/m2  SpO2 100%  General Appearance:    Alert, oriented, Tachypnic, appears stated age  Head:    Normocephalic, atraumatic  Eyes:    PERRL, EOMI, sclera non-icteric        Nose:   Nares without drainage or epistaxis. Mucosa, turbinates normal  Throat:   Moist mucous membranes. Oropharynx without erythema or exudate.  Neck:   Supple. No carotid bruits.  No thyromegaly.  No lymphadenopathy.   Back:     No CVA tenderness, no spinal tenderness  Lungs:     Clear to auscultation bilaterally, without wheezes, rhonchi or rales, Tachypnea  Chest wall:    No tenderness to palpitation  Heart:    Regular rate and rhythm without murmurs, gallops, rubs  Abdomen:     Soft, non-tender, nondistended, normal bowel sounds, no organomegaly  Genitalia:    deferred  Rectal:    deferred  Extremities:   No clubbing, cyanosis or edema.  Pulses:   2+ and symmetric all extremities  Skin:   Skin color, texture, turgor normal, no rashes or lesions  Lymph nodes:   Cervical, supraclavicular, and axillary nodes normal  Neurologic:   CNII-XII intact. Normal strength, sensation and reflexes      throughout    Labs on Admission:  Basic Metabolic Panel:  Recent Labs Lab 09/14/14 1816  NA 139  K 4.2  CL 124*  CO2 6*  GLUCOSE 100*  BUN 70*  CREATININE 5.04*  CALCIUM 8.1*   Liver Function Tests:  Recent Labs Lab 09/14/14 1816  AST 8  ALT 6  ALKPHOS 65  BILITOT 0.5  PROT 6.2  ALBUMIN 3.7   No results for input(s): LIPASE, AMYLASE in the last 168 hours. No results for input(s): AMMONIA in the last 168 hours. CBC:  Recent Labs Lab 09/14/14 1816  WBC 5.6  NEUTROABS 4.3  HGB 7.1*  HCT 22.4*  MCV 86.8  PLT 169   Cardiac Enzymes:  Recent Labs Lab 09/14/14 1816  TROPONINI <0.03    BNP (last 3 results) No results for input(s): PROBNP in the last 8760 hours. CBG: No results for input(s): GLUCAP in the  last 168 hours.  Radiological Exams on Admission: Dg Chest 2 View  09/14/2014   CLINICAL DATA:  Pt sen here by Dr Erling Cruz for abnormal labs, sob and weight loss. Pt had kidney transplant in 2009 and per Dr Florene Glen he believes the kidney is failing. [facility] states they faxed over all labs. Pt has hx of HTN, diabetes. Non-Smoker.  EXAM: CHEST  2 VIEW  COMPARISON:  None.  FINDINGS: There  are changes from a previous median sternotomy. Cardiac silhouette is mildly enlarged. No mediastinal or hilar masses. No convincing adenopathy.  Lungs are clear.  No pleural effusion or pneumothorax.  Bony thorax is unremarkable.  IMPRESSION: No acute cardiopulmonary disease.   Electronically Signed   By: Lajean Manes M.D.   On: 09/14/2014 18:06    EKG: Independently reviewed.  Assessment/Plan Principal Problem:   Kidney transplant failure Active Problems:   Essential hypertension   End-stage renal failure with renal transplant   Type 1 diabetes mellitus with renal manifestations   Renal transplant rejection   Metabolic acidosis, NAG, failure of bicarbonate regeneration   1. Kidney transplant failure - 1. IR to get dialysis access in AM 2. Dialysis after access put in 3. Nephrology is starting to taper down his anti-rejection medications with reductions in prograf and tacrolimus starting tonight. 4. Will need referral to vascular surgery for perm access 5. Stopping his home potassium 6. Holding his BP meds at Dr. Abel Presto request 7. Repeat labs in AM 2. Anemia of CKD - aranesp per nephrology 3. DM1 - continue home levemir and mealtime humalog, CBG checks AC/HS 4. Metabolic acidosis - continue home bicarb for now, should also improve following dialysis tomorrow.    Code Status: Full Code  Family Communication: No family in room Disposition Plan: Admit to inpatient   Time spent: 70 min  GARDNER, JARED M. [facility] Pager [phone]  If 7AM-7PM, please contact the day team  taking care of the patient [email or web address] Password [facility] 09/14/2014, 9:13 PM

## 2014-09-14 NOTE — ED Notes (Signed)
Pt in CT. Will come from CT to room per Tech 1st.

## 2014-09-14 NOTE — ED Notes (Signed)
Renal at bedside. States the plan is to admit pt. Pt will not allow IV placement in [facility]. Unable to gain access due to extensive grafts in forearms.

## 2014-09-14 NOTE — ED Provider Notes (Signed)
CSN: [id number]     Arrival date & time 09/14/14  1512 History   First MD Initiated Contact with Patient 09/14/14 1721     Chief Complaint  Patient presents with  . Shortness of Breath     (Consider location/radiation/quality/duration/timing/severity/associated sxs/prior Treatment) HPI   Patient with PMH significant for DMT1 and ESRD s/p transplant in 2009 presents today complaining of SOB x 1 month.  He states that he has been experiencing significant SOB.  At baseline he was able to walk around the house and perform ADLS; however, within the past month he has difficulty walking across the room due to SOB.  He denies any fevers, chills, N/V, abdominal pain, HA, lower extremity swelling, hemoptysis, hematochezia, and melena.  He does receive EPO injections due to anemia.  He does not require oxygen at home.  No recent trauma or illness.  No associated symptoms.    Past Medical History  Diagnosis Date  . Hypertension   . Diabetes mellitus without complication   . End-stage renal failure with renal transplant   . Gout   . Impotence of organic origin   . Type I (juvenile type) diabetes mellitus with renal manifestations, not stated as uncontrolled   . Secondary hyperparathyroidism    Past Surgical History  Procedure Laterality Date  . Birth defect heart surgery  1996  . Kidney transplant  2009  . Appendectomy     No family history on file. History  Substance Use Topics  . Smoking status: Never Smoker   . Smokeless tobacco: Not on file  . Alcohol Use: No    Review of Systems All other systems negative except as documented in the HPI. All pertinent positives and negatives as reviewed in the HPI.  Allergies  Iodinated diagnostic agents  Home Medications   Prior to Admission medications   Medication Sig Start Date End Date Taking? Authorizing Provider  amLODipine (NORVASC) 2.5 MG tablet Take 2.5 mg by mouth daily.    Historical Provider, MD  aspirin 81 MG tablet Take 81 mg  by mouth daily.    Historical Provider, MD  calcitRIOL (ROCALTROL) 0.5 MCG capsule Take 3 capsules (1.5 mcg total) by mouth daily. 11/30/13   Modena Jansky, MD  calcium carbonate (OS-CAL - DOSED IN MG OF ELEMENTAL CALCIUM) 1250 MG tablet Take 2 tablets (1,000 mg of elemental calcium total) by mouth 2 (two) times daily with a meal. 11/29/13   Modena Jansky, MD  carvedilol (COREG) 25 MG tablet Take 25 mg by mouth 2 (two) times daily with a meal.    Historical Provider, MD  glucose blood (ONE TOUCH ULTRA TEST) test strip Use to test blood glucose 2 times daily as instructed. Dx code: 250.41 01/11/14   Renato Shin, MD  Insulin Detemir (LEVEMIR) 100 UNIT/ML Pen Inject 5 Units into the skin at bedtime. 08/01/14   Renato Shin, MD  insulin lispro (HUMALOG) 100 UNIT/ML KiwkPen Inject 4 Units into the skin 3 (three) times daily with meals.    Historical Provider, MD  Insulin Pen Needle 31G X 5 MM MISC Use to inject insulin 4 times per day. 08/01/14   Renato Shin, MD  magnesium oxide (MAG-OX) 400 (241.3 MG) MG tablet Take 1 tablet (400 mg total) by mouth daily. 11/29/13   Modena Jansky, MD  mycophenolate (CELLCEPT) 250 MG capsule Take 750 mg by mouth 2 (two) times daily.     Historical Provider, MD  [facility] DELICA LANCETS [id number] MISC Use to test blood  sugar 2 times daily as instructed. Dx code: 250.41 01/11/14   Renato Shin, MD  predniSONE (DELTASONE) 5 MG tablet Take 5 mg by mouth daily.    Historical Provider, MD  sodium bicarbonate 325 MG tablet Take 325 mg by mouth 2 (two) times daily.    Historical Provider, MD  tacrolimus (PROGRAF) 1 MG capsule Take 2 mg by mouth 2 (two) times daily.    Historical Provider, MD  tacrolimus (PROGRAF) 5 MG capsule Take 5 mg by mouth 2 (two) times daily.    Historical Provider, MD   BP 118/57 mmHg  Pulse 68  Temp(Src) 98.3 F (36.8 C) (Oral)  Resp 18  Ht 5' 6.5" (1.689 m)  Wt 182 lb (82.555 kg)  BMI 28.94 kg/m2  SpO2 100% Physical Exam  Constitutional: He is oriented  to person, place, and time. He appears well-developed and well-nourished. No distress.  HENT:  Head: Normocephalic and atraumatic.  Eyes: Conjunctivae are normal. Pupils are equal, round, and reactive to light.  Neck: Normal range of motion. Neck supple.  Cardiovascular: Normal rate, regular rhythm, S1 normal, S2 normal and normal heart sounds.   No murmur heard. Pulmonary/Chest: Effort normal and breath sounds normal. No accessory muscle usage. Tachypnea noted. No respiratory distress. He has no wheezes. He has no rales.  Abdominal: Soft. Bowel sounds are normal. He exhibits no distension. There is no tenderness. There is no rebound and no guarding.  Neurological: He is alert and oriented to person, place, and time.  Skin: Skin is warm, dry and intact. He is not diaphoretic. No cyanosis. Nails show no clubbing.    ED Course  Procedures (including critical care time) Labs Review Labs Reviewed  COMPREHENSIVE METABOLIC PANEL  CBC WITH DIFFERENTIAL/PLATELET  D-DIMER, QUANTITATIVE  TROPONIN I  URINALYSIS, ROUTINE W REFLEX MICROSCOPIC    Imaging Review No results found.   EKG Interpretation None      MDM   Final diagnoses:  None    Labs demonstrate anemia with hgb of 7.1 which is most likely the etiology of the complaint.  Elevated BUN and Cr due to ESRD.  CXR negative.  Patient will be admitted for dialysis and right arm graft.  Nephrology came down and saw the patient also spoke with the hospitalist to admit the patient    Brent General, PA-C 09/15/14 0100  Charlesetta Shanks, MD 09/15/14 [phone]

## 2014-09-14 NOTE — ED Notes (Addendum)
Thayer Jew reports CO2 is 6. Gerald Stabs, P.A. informed.

## 2014-09-14 NOTE — Consult Note (Signed)
HPI: I was asked by Dr. to see Patrick [name] who is a 59 y.o. male with a history of ESRD s/p DDKT 2008 at [facility].  He had a bout of acute vascular rejection in April 2015 with a creainine peakin at  About 23m/dl and improving to 3.45 on 11/29/13.  In April 2015 he was hospitalized with a hemoglobin of 6.5gram treated with PRBCs. No GI work up done. On Jun 20 2014 creat was 3.359mdl and prograf level was not detectable.  He did not go for the repeat prograf level.  He c/o malaise, decreased appetitie, dizziness upon standing.  He has a bad taste in his mouth.  He has lost almost 30 pounds since November. He reports dyspnea with exertion, SOB at rest and wife reports he is hyperventilating. He was sent to the emergency room due to labs revealing a bicarbonate level of 8, creat of 4.85 and hemoglobin of 7.5gms.  He had an access that clotted a few months ago and Dr. DiScot Dockanted to hold off insertion until closer to needing dialysis.  Past Medical History  Diagnosis Date  . Hypertension   . Diabetes mellitus without complication   . End-stage renal failure with renal transplant   . Gout   . Impotence of organic origin   . Type I (juvenile type) diabetes mellitus with renal manifestations, not stated as uncontrolled   . Secondary hyperparathyroidism    Past Surgical History  Procedure Laterality Date  . Birth defect heart surgery  1996  . Kidney transplant  2009  . Appendectomy     Social History:  reports that he has never smoked. He does not have any smokeless tobacco history on file. He reports that he does not drink alcohol or use illicit drugs. Allergies:  Allergies  Allergen Reactions  . Iodinated Diagnostic Agents Rash    Other Reaction: burning to mouth, swelling of lips.   No family history on file.  Medications: I have reviewed the patient's current medications.  ROS: as per HPI Blood pressure 100/52, pulse 64, temperature 98.3 F (36.8 C),  temperature source Oral, resp. rate 20, height 5' 6.5" (1.689 m), weight 82.555 kg (182 lb), SpO2 100 %.  General appearance: alert and cooperative Head: Normocephalic, without obvious abnormality, atraumatic Eyes: negative Ears: normal TM's and external ear canals both ears Nose: Nares normal. Septum midline. Mucosa normal. No drainage or sinus tenderness. Throat: lips, mucosa, and tongue normal; teeth and gums normal Resp: clear to auscultation bilaterally Chest wall: no tenderness Cardio: regular rate and rhythm, S1, S2 normal, no murmur, click, rub or gallop GI: soft, non-tender; bowel sounds normal; no masses,  no organomegaly and renal allograft RLQ diff to palpate, abd wall herniation RLQ associated with tenderness Extremities: edema tr faile AV accesse bilat UEs Skin: Skin color, texture, turgor normal. No rashes or lesions or dry Neurologic: Grossly normal  Tremor is present but no asterixis Results for orders placed or performed during the hospital encounter of 09/14/14 (from the past 48 hour(s))  CBC with Differential     Status: Abnormal   Collection Time: 09/14/14  6:16 PM  Result Value Ref Range   WBC 5.6 4.0 - 10.5 K/uL   RBC 2.58 (L) 4.22 - 5.81 MIL/uL   Hemoglobin 7.1 (L) 13.0 - 17.0 g/dL   HCT 22.4 (L) 39.0 - 52.0 %   MCV 86.8 78.0 - 100.0 fL   MCH 27.5 26.0 - 34.0 pg   MCHC 31.7 30.0 -  36.0 g/dL   RDW 18.3 (H) 11.5 - 15.5 %   Platelets 169 150 - 400 K/uL   Neutrophils Relative % 76 43 - 77 %   Neutro Abs 4.3 1.7 - 7.7 K/uL   Lymphocytes Relative 14 12 - 46 %   Lymphs Abs 0.8 0.7 - 4.0 K/uL   Monocytes Relative 8 3 - 12 %   Monocytes Absolute 0.5 0.1 - 1.0 K/uL   Eosinophils Relative 2 0 - 5 %   Eosinophils Absolute 0.1 0.0 - 0.7 K/uL   Basophils Relative 0 0 - 1 %   Basophils Absolute 0.0 0.0 - 0.1 K/uL   Dg Chest 2 View  EXAM: CHEST  2 VIEW  COMPARISON:  None.  FINDINGS: There are changes from a previous median sternotomy. Cardiac silhouette is mildly  enlarged. No mediastinal or hilar masses. No convincing adenopathy.  Lungs are clear.  No pleural effusion or pneumothorax.  Bony thorax is unremarkable.  IMPRESSION: No acute cardiopulmonary disease.   Electronically Signed   By: Lajean Manes M.D.   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

## 2014-09-14 NOTE — ED Notes (Signed)
[facility] called and asked to re-fax info.

## 2014-09-14 NOTE — Progress Notes (Signed)
Pt admitted to the unit. Pt is alert and oriented. Pt oriented to room, staff, and call bell. Educated on fall safety plan. Bed in lowest position. Full assessment to Epic. Call bell with in reach. Educated to call for assists. Will continue to monitor. Mclane Arora, RN 

## 2014-09-14 NOTE — ED Notes (Addendum)
Pt sen here by Dr Erling Cruz for abnormal labs, sob and weight loss.  Pt had kidney transplant in 2009 and per Dr Florene Glen he believes the kidney is failing.  [facility] states they faxed over all labs.

## 2014-09-15 ENCOUNTER — Inpatient Hospital Stay (HOSPITAL_COMMUNITY): Payer: Medicare Other

## 2014-09-15 DIAGNOSIS — T8612 Kidney transplant failure: Principal | ICD-10-CM

## 2014-09-15 LAB — CBC
HEMATOCRIT: 21.5 % — AB (ref 39.0–52.0)
Hemoglobin: 6.8 g/dL — CL (ref 13.0–17.0)
MCH: 26.9 pg (ref 26.0–34.0)
MCHC: 31.6 g/dL (ref 30.0–36.0)
MCV: 85 fL (ref 78.0–100.0)
Platelets: 161 10*3/uL (ref 150–400)
RBC: 2.53 MIL/uL — ABNORMAL LOW (ref 4.22–5.81)
RDW: 18.1 % — ABNORMAL HIGH (ref 11.5–15.5)
WBC: 5.2 10*3/uL (ref 4.0–10.5)

## 2014-09-15 LAB — RENAL FUNCTION PANEL
ALBUMIN: 3.4 g/dL — AB (ref 3.5–5.2)
ANION GAP: 5 (ref 5–15)
BUN: 66 mg/dL — ABNORMAL HIGH (ref 6–23)
CALCIUM: 7.9 mg/dL — AB (ref 8.4–10.5)
CO2: 11 mmol/L — AB (ref 19–32)
Chloride: 124 mmol/L — ABNORMAL HIGH (ref 96–112)
Creatinine, Ser: 4.82 mg/dL — ABNORMAL HIGH (ref 0.50–1.35)
GFR calc Af Amer: 14 mL/min — ABNORMAL LOW (ref >90)
GFR, EST NON AFRICAN AMERICAN: 12 mL/min — AB (ref >90)
Glucose, Bld: 89 mg/dL (ref 70–99)
PHOSPHORUS: 5 mg/dL — AB (ref 2.3–4.6)
Potassium: 3.8 mmol/L (ref 3.5–5.1)
Sodium: 140 mmol/L (ref 135–145)

## 2014-09-15 LAB — PROTIME-INR
INR: 1.22 (ref 0.00–1.49)
Prothrombin Time: 15.5 seconds — ABNORMAL HIGH (ref 11.6–15.2)

## 2014-09-15 LAB — GLUCOSE, CAPILLARY
GLUCOSE-CAPILLARY: 94 mg/dL (ref 70–99)
GLUCOSE-CAPILLARY: 99 mg/dL (ref 70–99)
GLUCOSE-CAPILLARY: 122 mg/dL — AB (ref 70–99)
Glucose-Capillary: 87 mg/dL (ref 70–99)

## 2014-09-15 LAB — PREPARE RBC (CROSSMATCH)

## 2014-09-15 MED ORDER — LIDOCAINE HCL 1 % IJ SOLN
INTRAMUSCULAR | Status: AC
  Filled 2014-09-15: qty 20

## 2014-09-15 MED ORDER — MYCOPHENOLATE MOFETIL 250 MG PO CAPS
500.0000 mg | ORAL_CAPSULE | Freq: Every day | ORAL | Status: DC
  Administered 2014-09-16 – 2014-09-19 (×4): 500 mg via ORAL
  Filled 2014-09-15 (×5): qty 2

## 2014-09-15 MED ORDER — SODIUM CHLORIDE 0.9 % IV SOLN
INTRAVENOUS | Status: AC
  Administered 2014-09-15: 22:00:00 via INTRAVENOUS

## 2014-09-15 MED ORDER — MIDAZOLAM HCL 2 MG/2ML IJ SOLN
INTRAMUSCULAR | Status: AC
  Filled 2014-09-15: qty 4

## 2014-09-15 MED ORDER — MIDAZOLAM HCL 2 MG/2ML IJ SOLN
INTRAMUSCULAR | Status: AC | PRN
  Administered 2014-09-15: 1 mg via INTRAVENOUS

## 2014-09-15 MED ORDER — FENTANYL CITRATE 0.05 MG/ML IJ SOLN
INTRAMUSCULAR | Status: AC | PRN
  Administered 2014-09-15: 50 ug via INTRAVENOUS

## 2014-09-15 MED ORDER — GELATIN ABSORBABLE 12-7 MM EX MISC
CUTANEOUS | Status: AC
  Filled 2014-09-15: qty 1

## 2014-09-15 MED ORDER — HEPARIN SODIUM (PORCINE) 1000 UNIT/ML IJ SOLN
INTRAMUSCULAR | Status: AC
  Filled 2014-09-15: qty 1

## 2014-09-15 MED ORDER — SODIUM CHLORIDE 0.9 % IV SOLN
Freq: Once | INTRAVENOUS | Status: AC
  Administered 2014-09-18: 07:00:00 via INTRAVENOUS

## 2014-09-15 MED ORDER — DARBEPOETIN ALFA 200 MCG/0.4ML IJ SOSY
PREFILLED_SYRINGE | INTRAMUSCULAR | Status: AC
  Filled 2014-09-15: qty 0.4

## 2014-09-15 MED ORDER — FENTANYL CITRATE 0.05 MG/ML IJ SOLN
INTRAMUSCULAR | Status: AC
  Filled 2014-09-15: qty 4

## 2014-09-15 MED ORDER — CEFAZOLIN SODIUM-DEXTROSE 2-3 GM-% IV SOLR
2.0000 g | INTRAVENOUS | Status: AC
  Administered 2014-09-15: 2 g via INTRAVENOUS
  Filled 2014-09-15: qty 50

## 2014-09-15 NOTE — Progress Notes (Signed)
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   Renal transplant rejection   Metabolic acidosis, NAG, failure of bicarbonate regeneration    Chronic kidney disease stage V, s/p rejection episode in 2015 left with advanced CKD- now with symptomatic uremia requiring initiation of dialysis Permacath to be placed by IR First hemodialysis 2/19 Vascular surgery consulted for right upper arm graft Start the clip process Nephrology to make decisions about mycophenolate, Prograf, prednisone   Hypertension, stable  Diabetes continue NovoLog and Levemir, stable  Anemia Plan is to transfuse 2 units of packed red blood cells    Code Status: full Family Communication: family updated about patient's clinical progress Disposition Plan:  As above    Brief narrative: 59 y.o. male with a history of ESRD s/p DDKT 2008 at [facility]. He had a bout of acute vascular rejection in April 2015 with a creainine peakin at About 8mg /dl and improving to 3.45 on 11/29/13. In April 2015 he was hospitalized with a hemoglobin of 6.5gram treated with PRBCs. No GI work up done. On Jun 20 2014 creat was 3.39mg /dl and prograf level was not detectable. He did not go for the repeat prograf level. He c/o malaise, decreased appetitie, dizziness upon standing. He has a bad taste in his mouth. He has lost almost 30 pounds since November. He reports dyspnea with exertion, SOB at rest and wife reports he is hyperventilating. He was sent to the emergency room due to labs revealing a bicarbonate level of 8, creat of 4.85 and hemoglobin of 7.5gms. He had an access that clotted a few months ago and Dr. Scot Dock wanted to hold  off insertion until closer to needing dialysis.  Consultants:  Nephrology  Radiology  Vascular surgery    Procedures:  None  Antibiotics: None  HPI/Subjective: Complaining of shortness of breath, patient does seem very uncomfortable but denies any chest pain  Objective: Filed Vitals:   09/15/14 1252 09/15/14 1254 09/15/14 1258 09/15/14 1300  BP: 106/66 105/75 101/59 92/60  Pulse: 84 83 84 81  Temp:      TempSrc:      Resp: 18 20 17 18   Height:      Weight:      SpO2: 100% 99% 100% 100%    Intake/Output Summary (Last 24 hours) at 09/15/14 1329 Last data filed at 09/14/14 2338  Gross per 24 hour  Intake    120 ml  Output    225 ml  Net   -105 ml    Exam:  General: Hyperventilating Lungs: By basilar crackles Cardiovascular: Regular rate and rhythm without murmur gallop or rub normal S1 and S2 Abdomen: Nontender, nondistended, soft, bowel sounds positive, no rebound, no ascites, no appreciable mass Extremities: No significant cyanosis, clubbing, or edema bilateral lower extremities      Data Reviewed: Basic Metabolic Panel:  Recent Labs Lab 09/14/14 1816 09/15/14 0530  NA 139 140  K 4.2 3.8  CL 124* 124*  CO2 6* 11*  GLUCOSE 100* 89  BUN 70* 66*  CREATININE 5.04* 4.82*  CALCIUM 8.1* 7.9*  PHOS  --  5.0*    Liver Function Tests:  Recent Labs Lab 09/14/14 1816 09/15/14 0530  AST 8  --   ALT 6  --  ALKPHOS 65  --   BILITOT 0.5  --   PROT 6.2  --   ALBUMIN 3.7 3.4*   No results for input(s): LIPASE, AMYLASE in the last 168 hours. No results for input(s): AMMONIA in the last 168 hours.  CBC:  Recent Labs Lab 09/14/14 1816 09/15/14 0530  WBC 5.6 5.2  NEUTROABS 4.3  --   HGB 7.1* 6.8*  HCT 22.4* 21.5*  MCV 86.8 85.0  PLT 169 161    Cardiac Enzymes:  Recent Labs Lab 09/14/14 1816  TROPONINI <0.03   BNP (last 3 results) No results for input(s): BNP in the last 8760 hours.  ProBNP (last 3 results) No results for  input(s): PROBNP in the last 8760 hours.    CBG:  Recent Labs Lab 09/14/14 2224 09/15/14 0740 09/15/14 1107  GLUCAP 97 87 94    No results found for this or any previous visit (from the past 240 hour(s)).   Studies: Dg Chest 2 View  09/14/2014   CLINICAL DATA:  Pt sen here by Dr Erling Cruz for abnormal labs, sob and weight loss. Pt had kidney transplant in 2009 and per Dr Florene Glen he believes the kidney is failing. [facility] states they faxed over all labs. Pt has hx of HTN, diabetes. Non-Smoker.  EXAM: CHEST  2 VIEW  COMPARISON:  None.  FINDINGS: There are changes from a previous median sternotomy. Cardiac silhouette is mildly enlarged. No mediastinal or hilar masses. No convincing adenopathy.  Lungs are clear.  No pleural effusion or pneumothorax.  Bony thorax is unremarkable.  IMPRESSION: No acute cardiopulmonary disease.   Electronically Signed   By: Lajean Manes M.D.   On: 09/14/2014 18:06    Scheduled Meds: . sodium chloride   Intravenous Once  . aspirin EC  81 mg Oral Daily  . calcitRIOL  1.5 mcg Oral Daily  . calcium carbonate  2 tablet Oral BID WC  . citric acid-sodium citrate  30 mL Oral TID  . darbepoetin (ARANESP) injection - DIALYSIS  200 mcg Intravenous Q Fri-HD  . heparin  5,000 Units Subcutaneous 3 times per day  . insulin aspart  4 Units Subcutaneous TID WC  . insulin detemir  5 Units Subcutaneous QHS  . mycophenolate  500 mg Oral Daily  . predniSONE  5 mg Oral Q breakfast  . sodium bicarbonate  325 mg Oral BID  . sodium chloride  3 mL Intravenous Q12H  . tacrolimus  5 mg Oral BID   Continuous Infusions: . 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 If 7PM-7AM, please contact night-coverage at [email or web address], password [facility] 09/15/2014, 1:29 PM  LOS: 1 day

## 2014-09-15 NOTE — Procedures (Signed)
Patient was seen on dialysis and the procedure was supervised.  BFR 200  Via PC BP is  116/68.   Patient appears to be tolerating treatment well  Patrick [name] A 09/15/2014

## 2014-09-15 NOTE — Progress Notes (Signed)
[facility]  Subjective  - He came in today for SOB, nausea and fatigue.  He is in agreement to have right arm AV graft placed now that it is neccessary.   Objective 100/58 71 98.3 F (36.8 C) (Oral) 17 99%  Intake/Output Summary (Last 24 hours) at 09/15/14 1212 Last data filed at 09/14/14 2338  Gross per 24 hour  Intake    120 ml  Output    225 ml  Net   -105 ml    Non functioning bilateral forearm loop graft without thrill. Radial and brachial pulses palpable Lungs NAD, non labored breathing Heart RRR Abdomin soft NTTP   Assessment/Planning: ESRD s/p failed kidney transplant  Dr. Scot Dock saw this gentleman a few weeks ago and he is planning a right upper arm AV graft.   Dr. Scot Dock will discuss OR date later today.  Laurence Slate [facility] 09/15/2014 12:12 PM --  Laboratory Lab Results:  Recent Labs  09/14/14 1816 09/15/14 0530  WBC 5.6 5.2  HGB 7.1* 6.8*  HCT 22.4* 21.5*  PLT 169 161   BMET  COAG Lab Results  Component Value Date   INR 1.22 09/15/2014   No results found for: PTT   Jeffrey Voth MAUREEN PA-C            Expand All Collapse All     Patient ID: Patrick [name], male DOB: [birth year], 59 y.o. MRN: [id number]  Reason for Consult: Evaluate for hemodialysis access  Referred by Estanislado Emms, MD  Subjective:    HPI:  Patrick [name] is a 59 y.o. male who has a cadaveric renal transplant which has been gradually failing. However he is currently not on dialysis. He has stage IV chronic kidney disease. He previously had an attempted right radiocephalic AV fistula in [place] which failed. He then had a graft in his right forearm. Subsequently he had a left radiocephalic AV fistula which failed he then had a left forearm graft. All this was done elsewhere. He is sent over for evaluation for new access.  He denies any recent uremic symptoms. Specifically he denies nausea, vomiting, fatigue,  anorexia, or palpitations.   Past Medical History  Diagnosis Date  . Hypertension   . Diabetes mellitus without complication   . End-stage renal failure with renal transplant   . Gout   . Impotence of organic origin   . Type I (juvenile type) diabetes mellitus with renal manifestations, not stated as uncontrolled   . Secondary hyperparathyroidism    No family history on file. Past Surgical History  Procedure Laterality Date  . Birth defect heart surgery  1996  . Kidney transplant  2009  . Appendectomy      Short Social History:  History  Substance Use Topics  . Smoking status: Never Smoker   . Smokeless tobacco: Not on file  . Alcohol Use: No    Allergies  Allergen Reactions  . Iodinated Diagnostic Agents Rash    Other Reaction: burning to mouth, swelling of lips.    Current Outpatient Prescriptions  Medication Sig Dispense Refill  . amLODipine (NORVASC) 2.5 MG tablet Take 2.5 mg by mouth daily.    Marland Kitchen aspirin 81 MG tablet Take 81 mg by mouth daily.    . calcitRIOL (ROCALTROL) 0.5 MCG capsule Take 3 capsules (1.5 mcg total) by mouth daily. 90 capsule 0  . calcium carbonate (OS-CAL - DOSED IN MG OF ELEMENTAL CALCIUM) 1250 MG tablet Take 2 tablets (1,000 mg of elemental  calcium total) by mouth 2 (two) times daily with a meal. 120 tablet 0  . carvedilol (COREG) 25 MG tablet Take 25 mg by mouth 2 (two) times daily with a meal.    . Insulin Detemir (LEVEMIR) 100 UNIT/ML Pen Inject 5 Units into the skin at bedtime. 15 mL 2  . insulin lispro (HUMALOG KWIKPEN) 100 UNIT/ML KiwkPen INJECT 7 UNITS THREE TIMES DAILY JUST BEFORE MEALS 15 mL 2  . mycophenolate (CELLCEPT) 250 MG capsule Take 750 mg by mouth 2 (two) times daily.     . predniSONE (DELTASONE) 5 MG tablet Take 5 mg by mouth daily.    . sodium bicarbonate 325 MG tablet Take 325 mg by mouth 2 (two) times  daily.    . tacrolimus (PROGRAF) 1 MG capsule Take 2 mg by mouth 2 (two) times daily.    . tacrolimus (PROGRAF) 5 MG capsule Take 5 mg by mouth 2 (two) times daily.    Marland Kitchen glucose blood (ONE TOUCH ULTRA TEST) test strip Use to test blood glucose 2 times daily as instructed. Dx code: 75.41 (Patient not taking: Reported on 08/02/2014) 100 each 2  . Insulin Pen Needle 31G X 5 MM MISC Use to inject insulin 4 times per day. (Patient not taking: Reported on 08/02/2014) 100 each 2  . magnesium oxide (MAG-OX) 400 (241.3 MG) MG tablet Take 1 tablet (400 mg total) by mouth daily. (Patient not taking: Reported on 08/02/2014) 10 tablet 0  . ONETOUCH DELICA LANCETS 99991111 MISC Use to test blood sugar 2 times daily as instructed. Dx code: 68.41 (Patient not taking: Reported on 08/02/2014) 100 each 2   No current facility-administered medications for this visit.    Review of Systems  Constitutional: Negative for chills and fever.  Eyes: Negative for loss of vision.  Respiratory: Negative for cough and wheezing.  Cardiovascular: Negative for chest pain, chest tightness, claudication, dyspnea with exertion, orthopnea and palpitations.  GI: Negative for blood in stool and vomiting.  GU: Negative for dysuria and hematuria.  Musculoskeletal: Negative for leg pain, joint pain and myalgias.  Skin: Negative for rash and wound.  Neurological: Negative for dizziness and speech difficulty.  Hematologic: Negative for bruises/bleeds easily. Psychiatric: Negative for depressed mood.        Objective:  Objective  Filed Vitals:   08/02/14 1449  BP: 124/75  Pulse: 68  Height: 5' 6.5" (1.689 m)  Weight: 193 lb 4.8 oz (87.68 kg)  SpO2: 98%   Body mass index is 30.74 kg/(m^2).  Physical Exam  Constitutional: He is oriented to person, place, and time. He appears well-developed and well-nourished.  HENT:  Head: Normocephalic and atraumatic.  Neck: Neck supple. No JVD  present. No thyromegaly present.  Cardiovascular: Normal rate, regular rhythm and normal heart sounds. Exam reveals no friction rub.  No murmur heard. Pulses:  Radial pulses are 2+ on the right side, and 2+ on the left side.  Pulmonary/Chest: Breath sounds normal. He has no wheezes. He has no rales.  Abdominal: Soft. Bowel sounds are normal. There is no tenderness.  Musculoskeletal: Normal range of motion. He exhibits no edema.  Lymphadenopathy:   He has no cervical adenopathy.  Neurological: He is alert and oriented to person, place, and time. He has normal strength. No sensory deficit.  Skin: No lesion and no rash noted.  Psychiatric: He has a normal mood and affect.    Data: I have independently interpreted his vein map. His upper arm cephalic vein in both arms  is not adequate. His basilic veins in both arms are very small. Thus he does not appear to be a candidate for a fistula.   In November his creatinine clearance was 22.    Assessment/Plan:     STAGE IV CHRONIC KIDNEY DISEASE: I discussed this patient with Dr. Erling Cruz today. Given that he is not a candidate for a fistula, he would prefer [place] to wait before placing a right upper arm graft. If his kidney function continues to deteriorate and I would recommend placement of a right upper arm graft given that he is left-handed. We have discussed the procedure potential, locations in the office today And we will be happy to schedule his surgery if his kidney function deteriorates.     Angelia Mould MD [facility]

## 2014-09-15 NOTE — Procedures (Signed)
Successful LT IJ HD CATH TIP SVC/RA NO COMP STABLE READY FOR USE

## 2014-09-15 NOTE — Progress Notes (Signed)
Subjective:  Says he feels Ok but on further review- c/o nausea, fatigue and SOB- has that uremic smell  Objective Vital signs in last 24 hours: Filed Vitals:   09/14/14 2136 09/14/14 2226 09/15/14 0449 09/15/14 0928  BP:  133/68 107/63 100/58  Pulse:  76 78 71  Temp: 98.6 F (37 C) 98.5 F (36.9 C) 98.8 F (37.1 C) 98.3 F (36.8 C)  TempSrc: Oral Oral Oral Oral  Resp:  19 17 17   Height:      Weight:  82.1 kg (181 lb)    SpO2:  100% 100% 99%   Weight change:   Intake/Output Summary (Last 24 hours) at 09/15/14 1100 Last data filed at 09/14/14 2338  Gross per 24 hour  Intake    120 ml  Output    225 ml  Net   -105 ml    Assessment/ Plan: Pt is a 59 y.o. yo male s/p renal transplant with rejection issues who was admitted on 09/14/2014 with basically uremia and anemia Assessment/Plan: 1. Renal- s/p DDKT at [facility] in 2008- s/p rejection episode in 2015 left with advanced CKD- now with symptomatic uremia requiring initiation of dialysis- for PC to be placed by IR- appreciate their assist and first HD later today.  Have contacted [facility] to see about placing another permanent access- will also start the CLIP- he lives close to [facility].  For second HD tomorrow 2. HTN/volume- blood pressure is low on no meds- may be dry ? Definitely not edematous- will give a little fluid and not take any off with HD  3. Anemia- to get blood with HD today- start aranesp as well 4. Secondary hyperparathyroidism- was on calcitriol as OP- phos is 5 right now, will check PTH as well 5. S/P renal transplant- on prograf 5  BID, cellcept 500 BID and prednisone 5- will take cellcept down first and continue to wean IS meds as OP   Lynn Sissel A    Labs: Basic Metabolic Panel:  Recent Labs Lab 09/14/14 1816 09/15/14 0530  NA 139 140  K 4.2 3.8  CL 124* 124*  CO2 6* 11*  GLUCOSE 100* 89  BUN 70* 66*  CREATININE 5.04* 4.82*  CALCIUM 8.1* 7.9*  PHOS  --  5.0*   Liver Function Tests:  Recent  Labs Lab 09/14/14 1816 09/15/14 0530  AST 8  --   ALT 6  --   ALKPHOS 65  --   BILITOT 0.5  --   PROT 6.2  --   ALBUMIN 3.7 3.4*   No results for input(s): LIPASE, AMYLASE in the last 168 hours. No results for input(s): AMMONIA in the last 168 hours. CBC:  Recent Labs Lab 09/14/14 1816 09/15/14 0530  WBC 5.6 5.2  NEUTROABS 4.3  --   HGB 7.1* 6.8*  HCT 22.4* 21.5*  MCV 86.8 85.0  PLT 169 161   Cardiac Enzymes:  Recent Labs Lab 09/14/14 1816  TROPONINI <0.03   CBG:  Recent Labs Lab 09/14/14 2224 09/15/14 0740  GLUCAP 97 87    Iron Studies: No results for input(s): IRON, TIBC, TRANSFERRIN, FERRITIN in the last 72 hours. Studies/Results: Dg Chest 2 View  09/14/2014   CLINICAL DATA:  Pt sen here by Dr Erling Cruz for abnormal labs, sob and weight loss. Pt had kidney transplant in 2009 and per Dr Florene Glen he believes the kidney is failing. [facility] states they faxed over all labs. Pt has hx of HTN, diabetes. Non-Smoker.  EXAM: CHEST  2 VIEW  COMPARISON:  None.  FINDINGS: There are changes from a previous median sternotomy. Cardiac silhouette is mildly enlarged. No mediastinal or hilar masses. No convincing adenopathy.  Lungs are clear.  No pleural effusion or pneumothorax.  Bony thorax is unremarkable.  IMPRESSION: No acute cardiopulmonary disease.   Electronically Signed   By: Lajean Manes M.D.   On: 09/14/2014 18:06   Medications: Infusions:    Scheduled Medications: . sodium chloride   Intravenous Once  . aspirin EC  81 mg Oral Daily  . calcitRIOL  1.5 mcg Oral Daily  . calcium carbonate  2 tablet Oral BID WC  .  ceFAZolin (ANCEF) IV  2 g Intravenous On Call  . citric acid-sodium citrate  30 mL Oral TID  . darbepoetin (ARANESP) injection - DIALYSIS  200 mcg Intravenous Q Fri-HD  . heparin  5,000 Units Subcutaneous 3 times per day  . insulin aspart  4 Units Subcutaneous TID WC  . insulin detemir  5 Units Subcutaneous QHS  . mycophenolate  500 mg Oral  BID  . predniSONE  5 mg Oral Q breakfast  . sodium bicarbonate  325 mg Oral BID  . sodium chloride  3 mL Intravenous Q12H  . tacrolimus  5 mg Oral BID    have reviewed scheduled and prn medications.  Physical Exam: General: lying in bed- uremic smell Heart: RRR Lungs: clear Abdomen: soft, non tender Extremities: no edema Dialysis Access: none yet    09/15/2014,11:00 AM  LOS: 1 day

## 2014-09-15 NOTE — Progress Notes (Addendum)
CRITICAL VALUE ALERT  Critical value received:  Hemoglobin = 6.8  Date of notification:  09/15/14  Time of notification:  0653  Critical value read back:Yes.    Nurse who received alert:  Mady Gemma, RN  MD notified (1st page):  Dr. Allyson Sabal  Time of first page:  [phone] with call back

## 2014-09-15 NOTE — Consult Note (Signed)
Reason for consult: tunneled hemodialysis catheter  Referring Physician(s): Dr. Florene Glen    History of Present Illness: Patrick [name] is a 59 y.o. male with history of ESRD and DDKT IN 2008 at [facility] recently admitted with uremic syndrome/metabolic acidosis, dyspnea, malaise, anemia, failed DDKT.  He has bilat UE AVG's which are currently thrombosed  but vascular surgery wanted to hold off on further access procedures until closer to needing dialysis. Creatinine today is 4.82 , hgb 6.8 and request placed for tunneled HD catheter placement. Pt has stent in right upper chest which he was unaware off, possibly placed at time of cardiac surgery in [place] in 1996.   Past Medical History  Diagnosis Date  . Hypertension   . Diabetes mellitus without complication   . End-stage renal failure with renal transplant   . Gout   . Impotence of organic origin   . Type I (juvenile type) diabetes mellitus with renal manifestations, not stated as uncontrolled   . Secondary hyperparathyroidism     Past Surgical History  Procedure Laterality Date  . Birth defect heart surgery  1996  . Kidney transplant  2009  . Appendectomy      Allergies: Iodinated diagnostic agents  Medications: Prior to Admission medications   Medication Sig Start Date End Date Taking? Authorizing Provider  amLODipine (NORVASC) 2.5 MG tablet Take 2.5 mg by mouth daily.   Yes Historical Provider, MD  aspirin 81 MG tablet Take 81 mg by mouth daily.   Yes Historical Provider, MD  calcitRIOL (ROCALTROL) 0.5 MCG capsule Take 3 capsules (1.5 mcg total) by mouth daily. 11/30/13  Yes Modena Jansky, MD  calcium carbonate (OS-CAL - DOSED IN MG OF ELEMENTAL CALCIUM) 1250 MG tablet Take 2 tablets (1,000 mg of elemental calcium total) by mouth 2 (two) times daily with a meal. 11/29/13  Yes Modena Jansky, MD  carvedilol (COREG) 25 MG tablet Take 25 mg by mouth 2 (two) times daily with a meal.   Yes Historical  Provider, MD  glucose blood (ONE TOUCH ULTRA TEST) test strip Use to test blood glucose 2 times daily as instructed. Dx code: 250.41 01/11/14   Renato Shin, MD  Insulin Detemir (LEVEMIR) 100 UNIT/ML Pen Inject 5 Units into the skin at bedtime. 08/01/14  Yes Renato Shin, MD  insulin lispro (HUMALOG) 100 UNIT/ML KiwkPen Inject 4 Units into the skin 3 (three) times daily with meals.   Yes Historical Provider, MD  Insulin Pen Needle 31G X 5 MM MISC Use to inject insulin 4 times per day. 08/01/14   Renato Shin, MD  mycophenolate (CELLCEPT) 250 MG capsule Take 750 mg by mouth 2 (two) times daily.    Yes Historical Provider, MD  [facility] DELICA LANCETS [id number] MISC Use to test blood sugar 2 times daily as instructed. Dx code: 250.41 01/11/14   Renato Shin, MD  potassium chloride SA (K-DUR,KLOR-CON) 20 MEQ tablet Take 20 mEq by mouth 2 (two) times daily.   Yes Historical Provider, MD  predniSONE (DELTASONE) 5 MG tablet Take 5 mg by mouth daily.   Yes Historical Provider, MD  sodium bicarbonate 325 MG tablet Take 325 mg by mouth 2 (two) times daily.   Yes Historical Provider, MD  tacrolimus (PROGRAF) 1 MG capsule Take 2 mg by mouth 2 (two) times daily.   Yes Historical Provider, MD  tacrolimus (PROGRAF) 5 MG capsule Take 5 mg by mouth 2 (two) times daily.   Yes Historical Provider, MD  No family history on file.  History   Social History  . Marital Status: Married    Spouse Name: N/A  . Number of Children: N/A  . Years of Education: N/A   Social History Main Topics  . Smoking status: Never Smoker   . Smokeless tobacco: Not on file  . Alcohol Use: No  . Drug Use: No  . Sexual Activity: Not Currently    Birth Control/ Protection: None   Other Topics Concern  . None   Social History Narrative      Review of Systems: A 12 point ROS discussed and pertinent positives are indicated in the HPI above.  All other systems are negative.  Review of Systems   see above  Vital Signs: BP 100/58 mmHg   Pulse 71  Temp(Src) 98.3 F (36.8 C) (Oral)  Resp 17  Ht 5' 6.5" (1.689 m)  Wt 181 lb (82.1 kg)  BMI 28.78 kg/m2  SpO2 99%  Physical Exam pt awake/alert; chest- CTA bilat; heart- RRR; abd- soft, +BS, mildly tender RLQ abd wall herniation, clean scar RLQ from renal allograft ; ext- clotted bilat UE  AVG; no sig LE edema  Mallampati Score:     Imaging: Dg Chest 2 View  09/14/2014   CLINICAL DATA:  Pt sen here by Dr Erling Cruz for abnormal labs, sob and weight loss. Pt had kidney transplant in 2009 and per Dr Florene Glen he believes the kidney is failing. [facility] states they faxed over all labs. Pt has hx of HTN, diabetes. Non-Smoker.  EXAM: CHEST  2 VIEW  COMPARISON:  None.  FINDINGS: There are changes from a previous median sternotomy. Cardiac silhouette is mildly enlarged. No mediastinal or hilar masses. No convincing adenopathy.  Lungs are clear.  No pleural effusion or pneumothorax.  Bony thorax is unremarkable.  IMPRESSION: No acute cardiopulmonary disease.   Electronically Signed   By: Lajean Manes M.D.   On: 09/14/2014 18:06    Labs:  CBC:  Recent Labs  11/27/13 0546 11/29/13 0427  08/24/14 1349 09/07/14 1352 09/14/14 1816 09/15/14 0530  WBC 6.7 5.3  --   --   --  5.6 5.2  HGB 8.7* 8.7*  < > 9.9* 8.3* 7.1* 6.8*  HCT 25.4* 26.3*  --   --   --  22.4* 21.5*  PLT 129* 131*  --   --   --  169 161  < > = values in this interval not displayed.  COAGS: No results for input(s): INR, APTT in the last 8760 hours.  BMP:  Recent Labs  11/28/13 0447 11/29/13 0423 09/14/14 1816 09/15/14 0530  NA 142 145 139 140  K 3.3* 3.6* 4.2 3.8  CL 103 103 124* 124*  CO2 25 25 6* 11*  GLUCOSE 90 118* 100* 89  BUN 38* 36* 70* 66*  CALCIUM 6.0* 6.0* 8.1* 7.9*  CREATININE 3.48* 3.45* 5.04* 4.82*  GFRNONAA 18* 18* 11* 12*  GFRAA 21* 21* 13* 14*    LIVER FUNCTION TESTS:  Recent Labs  09/28/13 1434 11/23/13 2313 11/28/13 0447 11/29/13 0423 09/14/14 1816 09/15/14 0530    BILITOT 0.4 0.3  --   --  0.5  --   AST 14 8  --   --  8  --   ALT 9 6  --   --  6  --   ALKPHOS 54 57  --   --  65  --   PROT 9.8* 8.1  --   --  6.2  --   ALBUMIN 3.6 3.9 3.1* 3.0* 3.7 3.4*    TUMOR MARKERS: No results for input(s): AFPTM, CEA, CA199, CHROMGRNA in the last 8760 hours.  Assessment and Plan: Patrick [name] is a 59 y.o. male with history of ESRD and DDKT IN 2008 at [facility] recently admitted with uremic syndrome/metabolic acidosis, dyspnea, malaise, anemia, failed DDKT.  He has bilat UE AVG's which are currently thrombosed but vascular surgery wanted to hold off on further access procedures until closer to needing dialysis. Creatinine today is 4.82 , hgb 6.8 and request placed for tunneled HD catheter placement. Pt has stent in right upper chest which he was unaware off, possibly placed at time of cardiac surgery in [place] in 1996. Details/risks of procedure d/w pt with his understanding and consent. Procedure tent planned for later today. Pt will be transfused during HD session today after cath placement.   Signed: Autumn Messing 09/15/2014, 10:15 AM   I spent a total of 20 minutes in face to face in clinical consultation, greater than 50% of which was counseling/coordinating care for tunneled HD cath placement

## 2014-09-15 NOTE — Progress Notes (Signed)
INITIAL NUTRITION ASSESSMENT  Pt meets criteria for SEVERE MALNUTRITION in the context of chronic illness as evidenced by a 12% weight loss in 3 months, energy intake </= 75% for >/= 1 month, and severe muscle mass loss.  DOCUMENTATION CODES Per approved criteria  -Severe malnutrition in the context of chronic illness   INTERVENTION: Once diet advances, RD to order nutritional supplements.   Will continue to monitor.   NUTRITION DIAGNOSIS: Inadequate oral intake related to decreased appetite as evidenced by pt report.   Goal: Pt to meet >/= 90% of their estimated nutrition needs   Monitor:  Diet advancement weight trends, labs, I/O's  Reason for Assessment: MST  59 y.o. male  Admitting Dx: Kidney transplant failure  ASSESSMENT: Pt with history of T1DM, ESRD s/p DDKT in 2008. Patient is sent in from his nephrologists office with transplant failure and rejection.  Pt is currently NPO for procedure today. Pt requiring initiation of dialysis. First HD planned for today. PC cath to be placed. Pt reports having a decreased appetite which has been ongoing over the past 1 month. Pt reports only eating on 1 meal a day along with a cup of milk (Nesquick mixed in) to aid in his nutrition. Pt reports weight loss with usual body weight of 200 lbs. Pt with a 12% weight loss in 3 months. Pt is agreeable to oral supplements, however reports the "milky" drinks causes abdominal discomfort. RD to order supplements to pt's preference once diet advances.   Nutrition Focused Physical Exam:  Subcutaneous Fat:  Orbital Region: N/A Upper Arm Region: WNL Thoracic and Lumbar Region: WNL  Muscle:  Temple Region: N/A Clavicle Bone Region: WNL Clavicle and Acromion Bone Region: WNL Scapular Bone Region: N/A Dorsal Hand: N/A Patellar Region: Moderate depletion Anterior Thigh Region: Severe depletion Posterior Calf Region: Severe depletion  Edema: +1 facial   Labs: Low CO2, calcium, and  GFR. High chloride, BUN, creatinine, phosphorous (5.0).  Height: Ht Readings from Last 1 Encounters:  09/14/14 5' 6.5" (1.689 m)    Weight: Wt Readings from Last 1 Encounters:  09/14/14 181 lb (82.1 kg)    Ideal Body Weight: 145 lbs  % Ideal Body Weight: 125%  Wt Readings from Last 10 Encounters:  09/14/14 181 lb (82.1 kg)  08/28/14 182 lb (82.555 kg)  08/02/14 193 lb 4.8 oz (87.68 kg)  06/13/14 206 lb (93.441 kg)  03/13/14 208 lb (94.348 kg)  01/26/14 210 lb (95.255 kg)  12/06/13 213 lb (96.616 kg)  11/24/13 210 lb 5.1 oz (95.4 kg)  11/07/13 222 lb (100.699 kg)  10/18/13 221 lb (100.245 kg)    Usual Body Weight: 200 lbs  % Usual Body Weight: 91%  BMI:  Body mass index is 28.78 kg/(m^2).  Estimated Nutritional Needs: Kcal: 2100-2350 Protein: 105-120 grams Fluid: Per MD  Skin: +1 facial edema  Diet Order: Diet NPO time specified  EDUCATION NEEDS: -No education needs identified at this time   Intake/Output Summary (Last 24 hours) at 09/15/14 1130 Last data filed at 09/14/14 2338  Gross per 24 hour  Intake    120 ml  Output    225 ml  Net   -105 ml    Last BM: 2/18  Labs:   Recent Labs Lab 09/14/14 1816 09/15/14 0530  NA 139 140  K 4.2 3.8  CL 124* 124*  CO2 6* 11*  BUN 70* 66*  CREATININE 5.04* 4.82*  CALCIUM 8.1* 7.9*  PHOS  --  5.0*  GLUCOSE 100*  89    CBG (last 3)   Recent Labs  09/14/14 2224 09/15/14 0740 09/15/14 1107  GLUCAP 97 87 94    Scheduled Meds: . sodium chloride   Intravenous Once  . aspirin EC  81 mg Oral Daily  . calcitRIOL  1.5 mcg Oral Daily  . calcium carbonate  2 tablet Oral BID WC  .  ceFAZolin (ANCEF) IV  2 g Intravenous On Call  . citric acid-sodium citrate  30 mL Oral TID  . darbepoetin (ARANESP) injection - DIALYSIS  200 mcg Intravenous Q Fri-HD  . heparin  5,000 Units Subcutaneous 3 times per day  . insulin aspart  4 Units Subcutaneous TID WC  . insulin detemir  5 Units Subcutaneous QHS  .  mycophenolate  500 mg Oral Daily  . predniSONE  5 mg Oral Q breakfast  . sodium bicarbonate  325 mg Oral BID  . sodium chloride  3 mL Intravenous Q12H  . tacrolimus  5 mg Oral BID    Continuous Infusions: . sodium chloride      Past Medical History  Diagnosis Date  . Hypertension   . Diabetes mellitus without complication   . End-stage renal failure with renal transplant   . Gout   . Impotence of organic origin   . Type I (juvenile type) diabetes mellitus with renal manifestations, not stated as uncontrolled   . Secondary hyperparathyroidism     Past Surgical History  Procedure Laterality Date  . Birth defect heart surgery  1996  . Kidney transplant  2009  . Appendectomy      Kallie Locks, MS, RD, LDN Pager # [phone] After hours/ weekend pager # [phone]

## 2014-09-16 DIAGNOSIS — E43 Unspecified severe protein-calorie malnutrition: Secondary | ICD-10-CM | POA: Insufficient documentation

## 2014-09-16 LAB — GLUCOSE, CAPILLARY
GLUCOSE-CAPILLARY: 109 mg/dL — AB (ref 70–99)
GLUCOSE-CAPILLARY: 69 mg/dL — AB (ref 70–99)
GLUCOSE-CAPILLARY: 64 mg/dL — AB (ref 70–99)
Glucose-Capillary: 108 mg/dL — ABNORMAL HIGH (ref 70–99)
Glucose-Capillary: 130 mg/dL — ABNORMAL HIGH (ref 70–99)
Glucose-Capillary: 70 mg/dL (ref 70–99)

## 2014-09-16 LAB — TYPE AND SCREEN
ABO/RH(D): O POS
Antibody Screen: NEGATIVE
UNIT DIVISION: 0
Unit division: 0

## 2014-09-16 LAB — CBC
HCT: 25.7 % — ABNORMAL LOW (ref 39.0–52.0)
HEMOGLOBIN: 8.6 g/dL — AB (ref 13.0–17.0)
MCH: 26.6 pg (ref 26.0–34.0)
MCHC: 33.1 g/dL (ref 30.0–36.0)
MCV: 80.6 fL (ref 78.0–100.0)
Platelets: 121 10*3/uL — ABNORMAL LOW (ref 150–400)
RBC: 3.19 MIL/uL — AB (ref 4.22–5.81)
RDW: 17.2 % — ABNORMAL HIGH (ref 11.5–15.5)
WBC: 5.5 10*3/uL (ref 4.0–10.5)

## 2014-09-16 LAB — COMPREHENSIVE METABOLIC PANEL
ALT: 5 U/L (ref 0–53)
AST: 8 U/L (ref 0–37)
Albumin: 3.3 g/dL — ABNORMAL LOW (ref 3.5–5.2)
Alkaline Phosphatase: 62 U/L (ref 39–117)
Anion gap: 8 (ref 5–15)
BUN: 30 mg/dL — AB (ref 6–23)
CALCIUM: 7.4 mg/dL — AB (ref 8.4–10.5)
CHLORIDE: 109 mmol/L (ref 96–112)
CO2: 22 mmol/L (ref 19–32)
Creatinine, Ser: 2.91 mg/dL — ABNORMAL HIGH (ref 0.50–1.35)
GFR, EST AFRICAN AMERICAN: 25 mL/min — AB (ref >90)
GFR, EST NON AFRICAN AMERICAN: 22 mL/min — AB (ref >90)
GLUCOSE: 111 mg/dL — AB (ref 70–99)
POTASSIUM: 2.7 mmol/L — AB (ref 3.5–5.1)
Sodium: 139 mmol/L (ref 135–145)
Total Bilirubin: 0.7 mg/dL (ref 0.3–1.2)
Total Protein: 5.7 g/dL — ABNORMAL LOW (ref 6.0–8.3)

## 2014-09-16 LAB — HEPATITIS B CORE ANTIBODY, TOTAL: Hep B Core Total Ab: NEGATIVE

## 2014-09-16 LAB — IRON AND TIBC
Iron: 157 ug/dL — ABNORMAL HIGH (ref 42–165)
UIBC: <15 ug/dL — ABNORMAL LOW (ref 125–400)

## 2014-09-16 LAB — HEPATITIS B SURFACE ANTIBODY,QUALITATIVE: HEP B S AB: REACTIVE

## 2014-09-16 LAB — FERRITIN: FERRITIN: 1048 ng/mL — AB (ref 22–322)

## 2014-09-16 LAB — HEPATITIS B SURFACE ANTIGEN: Hepatitis B Surface Ag: NEGATIVE

## 2014-09-16 MED ORDER — ALTEPLASE 2 MG IJ SOLR
2.0000 mg | Freq: Once | INTRAMUSCULAR | Status: DC | PRN
  Filled 2014-09-16: qty 2

## 2014-09-16 MED ORDER — HEPARIN SODIUM (PORCINE) 1000 UNIT/ML DIALYSIS
20.0000 [IU]/kg | INTRAMUSCULAR | Status: DC | PRN
Start: 2014-09-16 — End: 2014-09-16

## 2014-09-16 MED ORDER — PENTAFLUOROPROP-TETRAFLUOROETH EX AERO: 1.0000 "application " | INHALATION_SPRAY | CUTANEOUS | Status: DC | PRN

## 2014-09-16 MED ORDER — SODIUM CHLORIDE 0.9 % IV SOLN: 100.0000 mL | INTRAVENOUS | Status: DC | PRN

## 2014-09-16 MED ORDER — LIDOCAINE HCL (PF) 1 % IJ SOLN: 5.0000 mL | INTRAMUSCULAR | Status: DC | PRN

## 2014-09-16 MED ORDER — NEPRO/CARBSTEADY PO LIQD
237.0000 mL | ORAL | Status: DC | PRN
  Filled 2014-09-16: qty 237

## 2014-09-16 MED ORDER — POTASSIUM CHLORIDE CRYS ER 20 MEQ PO TBCR
40.0000 meq | EXTENDED_RELEASE_TABLET | Freq: Once | ORAL | Status: AC
  Administered 2014-09-16: 40 meq via ORAL
  Filled 2014-09-16: qty 2

## 2014-09-16 MED ORDER — HEPARIN SODIUM (PORCINE) 1000 UNIT/ML DIALYSIS: 1000.0000 [IU] | INTRAMUSCULAR | Status: DC | PRN

## 2014-09-16 MED ORDER — LIDOCAINE-PRILOCAINE 2.5-2.5 % EX CREA
1.0000 "application " | TOPICAL_CREAM | CUTANEOUS | Status: DC | PRN
  Filled 2014-09-16: qty 5

## 2014-09-16 NOTE — Progress Notes (Signed)
Subjective:  Feels and looks better after HD yesterday Objective Vital signs in last 24 hours: Filed Vitals:   09/15/14 1845 09/15/14 2111 09/16/14 0449 09/16/14 1052  BP: 140/70 108/59 107/60 91/45  Pulse: 69 89 79 70  Temp: 98 F (36.7 C) 98.5 F (36.9 C) 98.6 F (37 C) 98.2 F (36.8 C)  TempSrc: Oral Oral Oral Oral  Resp: 15 14 15    Height:      Weight: 82 kg (180 lb 12.4 oz)     SpO2: 100% 100% 100% 100%   Weight change: -0.555 kg (-1 lb 3.6 oz)  Intake/Output Summary (Last 24 hours) at 09/16/14 1210 Last data filed at 09/16/14 [id number]  Gross per 24 hour  Intake 1228.33 ml  Output   -670 ml  Net 1898.33 ml    Assessment/ Plan: Pt is a 59 y.o. yo male s/p renal transplant with rejection issues who was admitted on 09/14/2014 with basically uremia and anemia Assessment/Plan: 1. Renal- s/p DDKT at [facility] in 2008- s/p rejection episode in 2015 left with advanced CKD- now with symptomatic uremia requiring initiation of dialysis- for PC placed by IR 2/19.   Have contacted [facility] to see about placing another graft, pt says they scheduled for Tuesday.   will also start the CLIP- he lives close to [facility].  First HD 2/19, second 2/10(today) third 2/22 2. HTN/volume- blood pressure is low on no meds- may be dry ? Definitely not edematous- running even with HD 3. Anemia- got blood with HD yest- start aranesp as well 4. Secondary hyperparathyroidism- was on calcitriol as OP to continue- phos is 5 right now, will check PTH as well (pending) 5. S/P renal transplant- on prograf 5  BID, cellcept 500 BID and prednisone 5- will take cellcept down first to daily and continue to wean IS meds as OP   Rether Rison A    Labs: Basic Metabolic Panel:  Recent Labs Lab 09/14/14 1816 09/15/14 0530 09/16/14 0705  NA 139 140 139  K 4.2 3.8 2.7*  CL 124* 124* 109  CO2 6* 11* 22  GLUCOSE 100* 89 111*  BUN 70* 66* 30*  CREATININE 5.04* 4.82* 2.91*  CALCIUM 8.1* 7.9* 7.4*  PHOS  --  5.0*  --     Liver Function Tests:  Recent Labs Lab 09/14/14 1816 09/15/14 0530 09/16/14 0705  AST 8  --  8  ALT 6  --  5  ALKPHOS 65  --  62  BILITOT 0.5  --  0.7  PROT 6.2  --  5.7*  ALBUMIN 3.7 3.4* 3.3*   No results for input(s): LIPASE, AMYLASE in the last 168 hours. No results for input(s): AMMONIA in the last 168 hours. CBC:  Recent Labs Lab 09/14/14 1816 09/15/14 0530 09/16/14 0705  WBC 5.6 5.2 5.5  NEUTROABS 4.3  --   --   HGB 7.1* 6.8* 8.6*  HCT 22.4* 21.5* 25.7*  MCV 86.8 85.0 80.6  PLT 169 161 121*   Cardiac Enzymes:  Recent Labs Lab 09/14/14 1816  TROPONINI <0.03   CBG:  Recent Labs Lab 09/15/14 0740 09/15/14 1107 09/15/14 1922 09/15/14 2113 09/16/14 0749  GLUCAP 87 94 99 122* 109*    Iron Studies:   Recent Labs  09/15/14 1621  FERRITIN 1048*   Studies/Results: Dg Chest 2 View  09/14/2014   CLINICAL DATA:  Pt sen here by Dr Erling Cruz for abnormal labs, sob and weight loss. Pt had kidney transplant in 2009 and per Dr  Florene Glen he believes the kidney is failing. [facility] states they faxed over all labs. Pt has hx of HTN, diabetes. Non-Smoker.  EXAM: CHEST  2 VIEW  COMPARISON:  None.  FINDINGS: There are changes from a previous median sternotomy. Cardiac silhouette is mildly enlarged. No mediastinal or hilar masses. No convincing adenopathy.  Lungs are clear.  No pleural effusion or pneumothorax.  Bony thorax is unremarkable.  IMPRESSION: No acute cardiopulmonary disease.   Electronically Signed   By: Lajean Manes M.D.   On: 09/14/2014 18:06   Ir Fluoro Guide Cv Line Left  09/15/2014   CLINICAL DATA:  End-stage renal disease, no current access.  EXAM: ULTRASOUND GUIDANCE FOR VASCULAR ACCESS  LEFT INTERNAL JUGULAR PERMANENT HEMODIALYSIS CATHETER  Date:  2/19/20162/19/2016 1:09 pm  Radiologist:  M. Daryll Brod, MD  Guidance:  Ultrasound and fluoroscopic  FLUOROSCOPY TIME:  42 seconds  MEDICATIONS AND MEDICAL HISTORY: 2 g Ancef administered within  1 hour of the procedure, 1 mg Versed, 50 mcg fentanyl  ANESTHESIA/SEDATION: 20 minutes  CONTRAST:  None  COMPLICATIONS: None immediate  PROCEDURE: Informed consent was obtained from the patient following explanation of the procedure, risks, benefits and alternatives. The patient understands, agrees and consents for the procedure. All questions were addressed. A time out was performed.  Maximal barrier sterile technique utilized including caps, mask, sterile gowns, sterile gloves, large sterile drape, hand hygiene, and 2% chlorhexidine scrub.  Under sterile conditions and local anesthesia, left internal jugular micropuncture venous access was performed with ultrasound. Images were obtained for documentation. A guide wire was inserted followed by a transitional dilator. Next, a 0.035 guidewire was advanced into the IVC with a 5-French catheter. Measurements were obtained from the left venotomy site to the proximal right atrium. In the left infraclavicular chest, a subcutaneous tunnel was created under sterile conditions and local anesthesia. 1% lidocaine with epinephrine was utilized for this. The 23 cm tip to cuff catheter was tunneled subcutaneously to the venotomy site and inserted into the SVC/RA junction through a valved peel-away sheath. Position was confirmed with fluoroscopy. Images were obtained for documentation. Blood was aspirated from the catheter followed by saline and heparin flushes. The appropriate volume and strength of heparin was instilled in each lumen. Caps were applied. The catheter was secured at the tunnel site with Gelfoam and a pursestring suture. The venotomy site was closed with subcuticular Vicryl suture. Dermabond was applied to the small right neck incision. A dry sterile dressing was applied. The catheter is ready for use. No immediate complications.  IMPRESSION: Ultrasound and fluoroscopically guided left internal jugular tunneled hemodialysis catheter (23 cm tip to cuff catheter).    Electronically Signed   By: Jerilynn Mages.  Shick M.D.   On: 09/15/2014 14:09   Ir US Guide Vasc Access Left  09/15/2014   CLINICAL DATA:  End-stage renal disease, no current access.  EXAM: ULTRASOUND GUIDANCE FOR VASCULAR ACCESS  LEFT INTERNAL JUGULAR PERMANENT HEMODIALYSIS CATHETER  Date:  2/19/20162/19/2016 1:09 pm  Radiologist:  M. Daryll Brod, MD  Guidance:  Ultrasound and fluoroscopic  FLUOROSCOPY TIME:  42 seconds  MEDICATIONS AND MEDICAL HISTORY: 2 g Ancef administered within 1 hour of the procedure, 1 mg Versed, 50 mcg fentanyl  ANESTHESIA/SEDATION: 20 minutes  CONTRAST:  None  COMPLICATIONS: None immediate  PROCEDURE: Informed consent was obtained from the patient following explanation of the procedure, risks, benefits and alternatives. The patient understands, agrees and consents for the procedure. All questions were addressed. A time out was performed.  Maximal barrier sterile technique utilized including caps, mask, sterile gowns, sterile gloves, large sterile drape, hand hygiene, and 2% chlorhexidine scrub.  Under sterile conditions and local anesthesia, left internal jugular micropuncture venous access was performed with ultrasound. Images were obtained for documentation. A guide wire was inserted followed by a transitional dilator. Next, a 0.035 guidewire was advanced into the IVC with a 5-French catheter. Measurements were obtained from the left venotomy site to the proximal right atrium. In the left infraclavicular chest, a subcutaneous tunnel was created under sterile conditions and local anesthesia. 1% lidocaine with epinephrine was utilized for this. The 23 cm tip to cuff catheter was tunneled subcutaneously to the venotomy site and inserted into the SVC/RA junction through a valved peel-away sheath. Position was confirmed with fluoroscopy. Images were obtained for documentation. Blood was aspirated from the catheter followed by saline and heparin flushes. The appropriate volume and strength of heparin  was instilled in each lumen. Caps were applied. The catheter was secured at the tunnel site with Gelfoam and a pursestring suture. The venotomy site was closed with subcuticular Vicryl suture. Dermabond was applied to the small right neck incision. A dry sterile dressing was applied. The catheter is ready for use. No immediate complications.  IMPRESSION: Ultrasound and fluoroscopically guided left internal jugular tunneled hemodialysis catheter (23 cm tip to cuff catheter).   Electronically Signed   By: Jerilynn Mages.  Shick M.D.   On: 09/15/2014 14:09   Medications: Infusions:    Scheduled Medications: . sodium chloride   Intravenous Once  . aspirin EC  81 mg Oral Daily  . calcitRIOL  1.5 mcg Oral Daily  . calcium carbonate  2 tablet Oral BID WC  . darbepoetin (ARANESP) injection - DIALYSIS  200 mcg Intravenous Q Fri-HD  . heparin  5,000 Units Subcutaneous 3 times per day  . insulin aspart  4 Units Subcutaneous TID WC  . insulin detemir  5 Units Subcutaneous QHS  . mycophenolate  500 mg Oral Daily  . predniSONE  5 mg Oral Q breakfast  . sodium bicarbonate  325 mg Oral BID  . sodium chloride  3 mL Intravenous Q12H  . tacrolimus  5 mg Oral BID    have reviewed scheduled and prn medications.  Physical Exam: General: lying in bed- looks better Heart: RRR Lungs: clear Abdomen: soft, non tender Extremities: no edema Dialysis Access: PC placed 2/19   09/16/2014,12:10 PM  LOS: 2 days

## 2014-09-16 NOTE — Procedures (Signed)
Patient was seen on dialysis and the procedure was supervised.  BFR 300  Via PC BP is  108/67.   Patient appears to be tolerating treatment well  Patrick [name] A 09/16/2014

## 2014-09-16 NOTE — Progress Notes (Signed)
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 Have contacted [facility] to see about placing another graft, pt says they scheduled for Tuesday.  Nephrology to start the CLIP- he lives close to [facility]. First HD 2/19, second 2/20, third  2/22 Vascular surgery consulted for right upper arm graft Nephrology to make decisions about mycophenolate, Prograf, prednisone   Hypertension, stable  Diabetes continue NovoLog and Levemir, stable  Anemia Status post 2 units of packed red blood cells 2/19 Hemoglobin 8.6 today  Hypokalemia Will replete, nephrology to adjust during dialysis   Code Status: full Family Communication: family updated about patient's clinical progress Disposition Plan:  As above    Brief narrative: 59 y.o. male with a history of ESRD s/p DDKT 2008 at [facility]. He had a bout of acute vascular rejection in April 2015 with a creainine peakin at About 8mg /dl and improving to 3.45 on 11/29/13. In April 2015 he was hospitalized with a hemoglobin of 6.5gram treated with PRBCs. No GI work up done. On Jun 20 2014 creat was 3.39mg /dl and prograf level was not detectable. He did not go for the repeat prograf level. He c/o malaise, decreased appetitie, dizziness upon standing. He has a bad taste in his mouth. He has lost almost 30 pounds since November. He reports dyspnea with  exertion, SOB at rest and wife reports he is hyperventilating. He was sent to the emergency room due to labs revealing a bicarbonate level of 8, creat of 4.85 and hemoglobin of 7.5gms. He had an access that clotted a few months ago and Dr. Scot Dock wanted to hold off insertion until closer to needing dialysis.  Consultants:  Nephrology  Radiology  Vascular surgery    Procedures:  None  Antibiotics: None  HPI/Subjective: Feels a lot better after hemodialysis today  Objective: Filed Vitals:   09/15/14 1845 09/15/14 2111 09/16/14 0449 09/16/14 1052  BP: 140/70 108/59 107/60 91/45  Pulse: 69 89 79 70  Temp: 98 F (36.7 C) 98.5 F (36.9 C) 98.6 F (37 C) 98.2 F (36.8 C)  TempSrc: Oral Oral Oral Oral  Resp: 15 14 15    Height:      Weight: 82 kg (180 lb 12.4 oz)     SpO2: 100% 100% 100% 100%    Intake/Output Summary (Last 24 hours) at 09/16/14 1222 Last data filed at 09/16/14 0839  Gross per 24 hour  Intake 1228.33 ml  Output   -670 ml  Net 1898.33 ml    Exam:  General: Hyperventilating Lungs: By basilar crackles Cardiovascular: Regular rate and rhythm without murmur gallop or rub normal S1 and S2 Abdomen: Nontender, nondistended, soft, bowel sounds positive, no rebound, no ascites, no appreciable mass Extremities: No significant cyanosis, clubbing, or edema bilateral lower extremities      Data Reviewed: Basic Metabolic Panel:  Recent Labs Lab 09/14/14 1816 09/15/14 0530 09/16/14 0705  NA 139 140 139  K 4.2 3.8 2.7*  CL 124* 124* 109  CO2 6* 11* 22  GLUCOSE 100* 89 111*  BUN 70* 66* 30*  CREATININE 5.04* 4.82* 2.91*  CALCIUM 8.1* 7.9* 7.4*  PHOS  --  5.0*  --     Liver Function Tests:  Recent Labs Lab 09/14/14 1816 09/15/14 0530 09/16/14 0705  AST 8  --  8  ALT 6  --  5  ALKPHOS 65  --  62  BILITOT 0.5  --  0.7  PROT 6.2  --  5.7*  ALBUMIN 3.7 3.4* 3.3*   No results for input(s): LIPASE, AMYLASE in the last 168 hours. No results  for input(s): AMMONIA in the last 168 hours.  CBC:  Recent Labs Lab 09/14/14 1816 09/15/14 0530 09/16/14 0705  WBC 5.6 5.2 5.5  NEUTROABS 4.3  --   --   HGB 7.1* 6.8* 8.6*  HCT 22.4* 21.5* 25.7*  MCV 86.8 85.0 80.6  PLT 169 161 121*    Cardiac Enzymes:  Recent Labs Lab 09/14/14 1816  TROPONINI <0.03   BNP (last 3 results) No results for input(s): BNP in the last 8760 hours.  ProBNP (last 3 results) No results for input(s): PROBNP in the last 8760 hours.    CBG:  Recent Labs Lab 09/15/14 0740 09/15/14 1107 09/15/14 1922 09/15/14 2113 09/16/14 0749  GLUCAP 87 94 99 122* 109*    No results found for this or any previous visit (from the past 240 hour(s)).   Studies: Dg Chest 2 View  09/14/2014   CLINICAL DATA:  Pt sen here by Dr Erling Cruz for abnormal labs, sob and weight loss. Pt had kidney transplant in 2009 and per Dr Florene Glen he believes the kidney is failing. [facility] states they faxed over all labs. Pt has hx of HTN, diabetes. Non-Smoker.  EXAM: CHEST  2 VIEW  COMPARISON:  None.  FINDINGS: There are changes from a previous median sternotomy. Cardiac silhouette is mildly enlarged. No mediastinal or hilar masses. No convincing adenopathy.  Lungs are clear.  No pleural effusion or pneumothorax.  Bony thorax is unremarkable.  IMPRESSION: No acute cardiopulmonary disease.   Electronically Signed   By: Lajean Manes M.D.   On: 09/14/2014 18:06   Ir Fluoro Guide Cv Line Left  09/15/2014   CLINICAL DATA:  End-stage renal disease, no current access.  EXAM: ULTRASOUND GUIDANCE FOR VASCULAR ACCESS  LEFT INTERNAL JUGULAR PERMANENT HEMODIALYSIS CATHETER  Date:  2/19/20162/19/2016 1:09 pm  Radiologist:  M. Daryll Brod, MD  Guidance:  Ultrasound and fluoroscopic  FLUOROSCOPY TIME:  42 seconds  MEDICATIONS AND MEDICAL HISTORY: 2 g Ancef administered within 1 hour of the procedure, 1 mg Versed, 50 mcg fentanyl  ANESTHESIA/SEDATION: 20 minutes  CONTRAST:  None   COMPLICATIONS: None immediate  PROCEDURE: Informed consent was obtained from the patient following explanation of the procedure, risks, benefits and alternatives. The patient understands, agrees and consents for the procedure. All questions were addressed. A time out was performed.  Maximal barrier sterile technique utilized including caps, mask, sterile gowns, sterile gloves, large sterile drape, hand hygiene, and 2% chlorhexidine scrub.  Under sterile conditions and local anesthesia, left internal jugular micropuncture venous access was performed with ultrasound. Images were obtained for documentation. A guide wire was inserted followed by a transitional dilator. Next, a 0.035 guidewire was advanced into the IVC with a 5-French catheter. Measurements were obtained from the left venotomy site to the proximal right atrium. In the left infraclavicular chest, a subcutaneous tunnel was created under sterile conditions and local  anesthesia. 1% lidocaine with epinephrine was utilized for this. The 23 cm tip to cuff catheter was tunneled subcutaneously to the venotomy site and inserted into the SVC/RA junction through a valved peel-away sheath. Position was confirmed with fluoroscopy. Images were obtained for documentation. Blood was aspirated from the catheter followed by saline and heparin flushes. The appropriate volume and strength of heparin was instilled in each lumen. Caps were applied. The catheter was secured at the tunnel site with Gelfoam and a pursestring suture. The venotomy site was closed with subcuticular Vicryl suture. Dermabond was applied to the small right neck incision. A dry sterile dressing was applied. The catheter is ready for use. No immediate complications.  IMPRESSION: Ultrasound and fluoroscopically guided left internal jugular tunneled hemodialysis catheter (23 cm tip to cuff catheter).   Electronically Signed   By: Jerilynn Mages.  Shick M.D.   On: 09/15/2014 14:09   Ir US Guide Vasc Access  Left  09/15/2014   CLINICAL DATA:  End-stage renal disease, no current access.  EXAM: ULTRASOUND GUIDANCE FOR VASCULAR ACCESS  LEFT INTERNAL JUGULAR PERMANENT HEMODIALYSIS CATHETER  Date:  2/19/20162/19/2016 1:09 pm  Radiologist:  M. Daryll Brod, MD  Guidance:  Ultrasound and fluoroscopic  FLUOROSCOPY TIME:  42 seconds  MEDICATIONS AND MEDICAL HISTORY: 2 g Ancef administered within 1 hour of the procedure, 1 mg Versed, 50 mcg fentanyl  ANESTHESIA/SEDATION: 20 minutes  CONTRAST:  None  COMPLICATIONS: None immediate  PROCEDURE: Informed consent was obtained from the patient following explanation of the procedure, risks, benefits and alternatives. The patient understands, agrees and consents for the procedure. All questions were addressed. A time out was performed.  Maximal barrier sterile technique utilized including caps, mask, sterile gowns, sterile gloves, large sterile drape, hand hygiene, and 2% chlorhexidine scrub.  Under sterile conditions and local anesthesia, left internal jugular micropuncture venous access was performed with ultrasound. Images were obtained for documentation. A guide wire was inserted followed by a transitional dilator. Next, a 0.035 guidewire was advanced into the IVC with a 5-French catheter. Measurements were obtained from the left venotomy site to the proximal right atrium. In the left infraclavicular chest, a subcutaneous tunnel was created under sterile conditions and local anesthesia. 1% lidocaine with epinephrine was utilized for this. The 23 cm tip to cuff catheter was tunneled subcutaneously to the venotomy site and inserted into the SVC/RA junction through a valved peel-away sheath. Position was confirmed with fluoroscopy. Images were obtained for documentation. Blood was aspirated from the catheter followed by saline and heparin flushes. The appropriate volume and strength of heparin was instilled in each lumen. Caps were applied. The catheter was secured at the tunnel site  with Gelfoam and a pursestring suture. The venotomy site was closed with subcuticular Vicryl suture. Dermabond was applied to the small right neck incision. A dry sterile dressing was applied. The catheter is ready for use. No immediate complications.  IMPRESSION: Ultrasound and fluoroscopically guided left internal jugular tunneled hemodialysis catheter (23 cm tip to cuff catheter).   Electronically Signed   By: Jerilynn Mages.  Shick M.D.   On: 09/15/2014 14:09    Scheduled Meds: . sodium chloride   Intravenous Once  . aspirin EC  81 mg Oral Daily  . calcitRIOL  1.5 mcg Oral Daily  . calcium carbonate  2 tablet Oral BID WC  . darbepoetin (ARANESP) injection - DIALYSIS  200 mcg Intravenous Q Fri-HD  . heparin  5,000 Units Subcutaneous 3 times per day  . insulin aspart  4 Units  Subcutaneous TID WC  . insulin detemir  5 Units Subcutaneous QHS  . mycophenolate  500 mg Oral Daily  . predniSONE  5 mg Oral Q breakfast  . sodium chloride  3 mL Intravenous Q12H  . 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 If 7PM-7AM, please contact night-coverage at [email or web address], password [facility] 09/16/2014, 12:22 PM  LOS: 2 days

## 2014-09-17 LAB — COMPREHENSIVE METABOLIC PANEL
ALBUMIN: 3.3 g/dL — AB (ref 3.5–5.2)
ALT: 6 U/L (ref 0–53)
ANION GAP: 4 — AB (ref 5–15)
AST: 11 U/L (ref 0–37)
Alkaline Phosphatase: 66 U/L (ref 39–117)
BILIRUBIN TOTAL: 0.3 mg/dL (ref 0.3–1.2)
BUN: 15 mg/dL (ref 6–23)
CALCIUM: 8.1 mg/dL — AB (ref 8.4–10.5)
CHLORIDE: 110 mmol/L (ref 96–112)
CO2: 26 mmol/L (ref 19–32)
Creatinine, Ser: 2.88 mg/dL — ABNORMAL HIGH (ref 0.50–1.35)
GFR, EST AFRICAN AMERICAN: 26 mL/min — AB (ref >90)
GFR, EST NON AFRICAN AMERICAN: 22 mL/min — AB (ref >90)
Glucose, Bld: 103 mg/dL — ABNORMAL HIGH (ref 70–99)
Potassium: 3 mmol/L — ABNORMAL LOW (ref 3.5–5.1)
SODIUM: 140 mmol/L (ref 135–145)
TOTAL PROTEIN: 5.6 g/dL — AB (ref 6.0–8.3)

## 2014-09-17 LAB — GLUCOSE, CAPILLARY
GLUCOSE-CAPILLARY: 102 mg/dL — AB (ref 70–99)
GLUCOSE-CAPILLARY: 138 mg/dL — AB (ref 70–99)
Glucose-Capillary: 147 mg/dL — ABNORMAL HIGH (ref 70–99)
Glucose-Capillary: 96 mg/dL (ref 70–99)
Glucose-Capillary: 111 mg/dL — ABNORMAL HIGH (ref 70–99)

## 2014-09-17 LAB — SURGICAL PCR SCREEN
MRSA, PCR: NEGATIVE
Staphylococcus aureus: NEGATIVE

## 2014-09-17 LAB — PARATHYROID HORMONE, INTACT (NO CA): PTH: 7 pg/mL — ABNORMAL LOW (ref 15–65)

## 2014-09-17 MED ORDER — CEFAZOLIN SODIUM 1-5 GM-% IV SOLN
1.0000 g | INTRAVENOUS | Status: DC
  Filled 2014-09-17: qty 50

## 2014-09-17 MED ORDER — CEFAZOLIN SODIUM-DEXTROSE 2-3 GM-% IV SOLR
2.0000 g | INTRAVENOUS | Status: AC
  Administered 2014-09-18: 2 g via INTRAVENOUS
  Filled 2014-09-17: qty 50

## 2014-09-17 MED ORDER — POTASSIUM CHLORIDE CRYS ER 20 MEQ PO TBCR
20.0000 meq | EXTENDED_RELEASE_TABLET | Freq: Once | ORAL | Status: AC
  Administered 2014-09-17: 20 meq via ORAL
  Filled 2014-09-17: qty 1

## 2014-09-17 NOTE — Progress Notes (Signed)
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 Seen by Dr. Scot Dock, vascular surgery for right upper arm graft placement on 2/22 Nephrology to start the CLIP- he lives close to [facility]. First HD 2/19, second 2/20, third  2/22 Nephrology to make decisions about mycophenolate, Prograf, prednisone   Hypertension, borderline low, will he need MIDODRINE? Holding Norvasc, Coreg  Diabetes stable, continue NovoLog and Levemir, stable  Anemia Status post 2 units of packed red blood cells 2/19 Repeat CBC tomorrow   Hypokalemia Will replete, nephrology to adjust during dialysis   Code Status: full Family Communication: family updated about patient's clinical progress Disposition Plan: AV graft tomorrow     Brief narrative: 59 y.o. male with a history of ESRD s/p DDKT 2008 at [facility]. He had a bout of acute vascular rejection in April 2015 with a creainine peakin at About 8mg /dl and improving to 3.45 on 11/29/13. In April 2015 he was hospitalized with a hemoglobin of 6.5gram treated with PRBCs. No GI work up done. On Jun 20 2014 creat was 3.39mg /dl and prograf level was not detectable. He did not go for the repeat prograf level. He c/o malaise, decreased appetitie, dizziness upon standing. He has a bad taste in his mouth. He has lost almost 30 pounds since November. He reports  dyspnea with exertion, SOB at rest and wife reports he is hyperventilating. He was sent to the emergency room due to labs revealing a bicarbonate level of 8, creat of 4.85 and hemoglobin of 7.5gms. He had an access that clotted a few months ago and Dr. Scot Dock wanted to hold off insertion until closer to needing dialysis.  Consultants:  Nephrology  Radiology  Vascular surgery    Procedures:  None  Antibiotics: None  HPI/Subjective: No complaints, feeling well nausea last night  Objective: Filed Vitals:   09/16/14 1717 09/16/14 2139 09/17/14 0415 09/17/14 0923  BP: 117/79 128/72 121/76 100/60  Pulse: 68 75 78 80  Temp: 98.2 F (36.8 C) 99 F (37.2 C) 98.6 F (37 C) 98.3 F (36.8 C)  TempSrc: Oral Oral Oral Oral  Resp: 20 18 16 17   Height:      Weight:      SpO2: 100% 100% 98% 100%    Intake/Output Summary (Last 24 hours) at 09/17/14 1232 Last data filed at 09/17/14 0945  Gross per 24 hour  Intake    960 ml  Output     30 ml  Net    930 ml    Exam:  General: Appears comfortable Lungs: By basilar crackles Cardiovascular: Regular rate and rhythm without murmur gallop or rub normal S1 and S2 Abdomen: Nontender, nondistended, soft, bowel sounds positive, no rebound, no ascites, no appreciable mass Extremities: No significant cyanosis, clubbing, or edema bilateral lower extremities      Data Reviewed: Basic Metabolic Panel:  Recent Labs Lab 09/14/14 1816 09/15/14 0530 09/16/14 0705  NA 139 140 139  K 4.2 3.8 2.7*  CL 124* 124* 109  CO2 6* 11* 22  GLUCOSE 100* 89 111*  BUN 70* 66* 30*  CREATININE 5.04* 4.82* 2.91*  CALCIUM 8.1* 7.9* 7.4*  PHOS  --  5.0*  --     Liver Function Tests:  Recent Labs Lab 09/14/14 1816 09/15/14 0530 09/16/14 0705  AST 8  --  8  ALT 6  --  5  ALKPHOS 65  --  62  BILITOT 0.5  --  0.7  PROT 6.2  --  5.7*  ALBUMIN 3.7 3.4* 3.3*   No results for input(s): LIPASE, AMYLASE in the last 168 hours. No results for  input(s): AMMONIA in the last 168 hours.  CBC:  Recent Labs Lab 09/14/14 1816 09/15/14 0530 09/16/14 0705  WBC 5.6 5.2 5.5  NEUTROABS 4.3  --   --   HGB 7.1* 6.8* 8.6*  HCT 22.4* 21.5* 25.7*  MCV 86.8 85.0 80.6  PLT 169 161 121*    Cardiac Enzymes:  Recent Labs Lab 09/14/14 1816  TROPONINI <0.03   BNP (last 3 results) No results for input(s): BNP in the last 8760 hours.  ProBNP (last 3 results) No results for input(s): PROBNP in the last 8760 hours.    CBG:  Recent Labs Lab 09/16/14 2136 09/16/14 2158 09/17/14 0056 09/17/14 0737 09/17/14 1114  GLUCAP 64* 70 147* 102* 96    No results found for this or any previous visit (from the past 240 hour(s)).   Studies: Dg Chest 2 View  09/14/2014   CLINICAL DATA:  Pt sen here by Dr Erling Cruz for abnormal labs, sob and weight loss. Pt had kidney transplant in 2009 and per Dr Florene Glen he believes the kidney is failing. [facility] states they faxed over all labs. Pt has hx of HTN, diabetes. Non-Smoker.  EXAM: CHEST  2 VIEW  COMPARISON:  None.  FINDINGS: There are changes from a previous median sternotomy. Cardiac silhouette is mildly enlarged. No mediastinal or hilar masses. No convincing adenopathy.  Lungs are clear.  No pleural effusion or pneumothorax.  Bony thorax is unremarkable.  IMPRESSION: No acute cardiopulmonary disease.   Electronically Signed   By: Lajean Manes M.D.   On: 09/14/2014 18:06   Ir Fluoro Guide Cv Line Left  09/15/2014   CLINICAL DATA:  End-stage renal disease, no current access.  EXAM: ULTRASOUND GUIDANCE FOR VASCULAR ACCESS  LEFT INTERNAL JUGULAR PERMANENT HEMODIALYSIS CATHETER  Date:  2/19/20162/19/2016 1:09 pm  Radiologist:  M. Daryll Brod, MD  Guidance:  Ultrasound and fluoroscopic  FLUOROSCOPY TIME:  42 seconds  MEDICATIONS AND MEDICAL HISTORY: 2 g Ancef administered within 1 hour of the procedure, 1 mg Versed, 50 mcg fentanyl  ANESTHESIA/SEDATION: 20 minutes  CONTRAST:  None  COMPLICATIONS:  None immediate  PROCEDURE: Informed consent was obtained from the patient following explanation of the procedure, risks, benefits and alternatives. The patient understands, agrees and consents for the procedure. All questions were addressed. A time out was performed.  Maximal barrier sterile technique utilized including caps, mask, sterile gowns, sterile gloves, large sterile drape, hand hygiene, and 2% chlorhexidine scrub.  Under sterile conditions and local anesthesia, left internal jugular micropuncture venous access was performed with ultrasound. Images were obtained for documentation. A guide wire was inserted followed by a transitional dilator. Next, a 0.035 guidewire was advanced into the IVC with a 5-French catheter. Measurements were obtained from the left venotomy site to the proximal right atrium. In the left infraclavicular chest, a subcutaneous tunnel was created  under sterile conditions and local anesthesia. 1% lidocaine with epinephrine was utilized for this. The 23 cm tip to cuff catheter was tunneled subcutaneously to the venotomy site and inserted into the SVC/RA junction through a valved peel-away sheath. Position was confirmed with fluoroscopy. Images were obtained for documentation. Blood was aspirated from the catheter followed by saline and heparin flushes. The appropriate volume and strength of heparin was instilled in each lumen. Caps were applied. The catheter was secured at the tunnel site with Gelfoam and a pursestring suture. The venotomy site was closed with subcuticular Vicryl suture. Dermabond was applied to the small right neck incision. A dry sterile dressing was applied. The catheter is ready for use. No immediate complications.  IMPRESSION: Ultrasound and fluoroscopically guided left internal jugular tunneled hemodialysis catheter (23 cm tip to cuff catheter).   Electronically Signed   By: Jerilynn Mages.  Shick M.D.   On: 09/15/2014 14:09   Ir US Guide Vasc Access Left  09/15/2014    CLINICAL DATA:  End-stage renal disease, no current access.  EXAM: ULTRASOUND GUIDANCE FOR VASCULAR ACCESS  LEFT INTERNAL JUGULAR PERMANENT HEMODIALYSIS CATHETER  Date:  2/19/20162/19/2016 1:09 pm  Radiologist:  M. Daryll Brod, MD  Guidance:  Ultrasound and fluoroscopic  FLUOROSCOPY TIME:  42 seconds  MEDICATIONS AND MEDICAL HISTORY: 2 g Ancef administered within 1 hour of the procedure, 1 mg Versed, 50 mcg fentanyl  ANESTHESIA/SEDATION: 20 minutes  CONTRAST:  None  COMPLICATIONS: None immediate  PROCEDURE: Informed consent was obtained from the patient following explanation of the procedure, risks, benefits and alternatives. The patient understands, agrees and consents for the procedure. All questions were addressed. A time out was performed.  Maximal barrier sterile technique utilized including caps, mask, sterile gowns, sterile gloves, large sterile drape, hand hygiene, and 2% chlorhexidine scrub.  Under sterile conditions and local anesthesia, left internal jugular micropuncture venous access was performed with ultrasound. Images were obtained for documentation. A guide wire was inserted followed by a transitional dilator. Next, a 0.035 guidewire was advanced into the IVC with a 5-French catheter. Measurements were obtained from the left venotomy site to the proximal right atrium. In the left infraclavicular chest, a subcutaneous tunnel was created under sterile conditions and local anesthesia. 1% lidocaine with epinephrine was utilized for this. The 23 cm tip to cuff catheter was tunneled subcutaneously to the venotomy site and inserted into the SVC/RA junction through a valved peel-away sheath. Position was confirmed with fluoroscopy. Images were obtained for documentation. Blood was aspirated from the catheter followed by saline and heparin flushes. The appropriate volume and strength of heparin was instilled in each lumen. Caps were applied. The catheter was secured at the tunnel site with Gelfoam and a  pursestring suture. The venotomy site was closed with subcuticular Vicryl suture. Dermabond was applied to the small right neck incision. A dry sterile dressing was applied. The catheter is ready for use. No immediate complications.  IMPRESSION: Ultrasound and fluoroscopically guided left internal jugular tunneled hemodialysis catheter (23 cm tip to cuff catheter).   Electronically Signed   By: Jerilynn Mages.  Shick M.D.   On: 09/15/2014 14:09    Scheduled Meds: . sodium chloride   Intravenous Once  . aspirin EC  81 mg Oral Daily  . calcitRIOL  1.5 mcg Oral Daily  . calcium carbonate  2 tablet Oral BID WC  . [START ON 09/18/2014]  ceFAZolin (ANCEF) IV  1 g Intravenous On Call  . darbepoetin (ARANESP) injection - DIALYSIS  200 mcg Intravenous  Q Fri-HD  . heparin  5,000 Units Subcutaneous 3 times per day  . insulin aspart  4 Units Subcutaneous TID WC  . insulin detemir  5 Units Subcutaneous QHS  . mycophenolate  500 mg Oral Daily  . predniSONE  5 mg Oral Q breakfast  . sodium chloride  3 mL Intravenous Q12H  . 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 If 7PM-7AM, please contact night-coverage at [email or web address], password [email or web address] 09/17/2014, 12:32 PM  LOS: 3 days

## 2014-09-17 NOTE — Consult Note (Signed)
[facility] and [facility]in Specialist of [facility]  Patient name: Patrick [name] MRN: [id number] DOB: 1955-08-05 Sex: male  REASON FOR CONSULT: Patient now agreeable to hemodialysis access  HPI: Patrick [name] is a 59 y.o. male who I evaluated in the office on 08/02/2014 for hemodialysis access. He had a cadaveric renal transplant which ultimately failed. He was admitted with renal failure and a catheter has been placed by interventional radiology. He is now agreeable to placement of a graft. He had previously had an attempted right radiocephalic fistula in [facility] which failed. He then had a graft in his right forearm. He subsequently had a left radiocephalic fistula which failed and had a left forearm graft. This was all done elsewhere. I recommended placement of a right upper arm AV graft. However initially he was not agreeable to proceed.  In our office he underwent a vein mapping which showed that his cephalic veins in both arms were not adequate area and likewise the basilic veins were not adequate. I did not think he was a candidate for a AV fistula.  He is admitted now with progressive renal insufficiency.  Past Medical History  Diagnosis Date  . Hypertension   . Diabetes mellitus without complication   . End-stage renal failure with renal transplant   . Gout   . Impotence of organic origin   . Type I (juvenile type) diabetes mellitus with renal manifestations, not stated as uncontrolled   . Secondary hyperparathyroidism     No family history on file.  SOCIAL HISTORY: History  Substance Use Topics  . Smoking status: Never Smoker   . Smokeless tobacco: Not on file  . Alcohol Use: No    Allergies  Allergen Reactions  . Iodinated Diagnostic Agents Rash    Other Reaction: burning to mouth, swelling of lips.    Current Facility-Administered Medications  Medication Dose Route Frequency Provider Last Rate Last Dose  . 0.9 %  sodium chloride infusion   Intravenous  Once Reyne Dumas, MD      . aspirin EC tablet 81 mg  81 mg Oral Daily Etta Quill, DO   81 mg at 09/16/14 0910  . calcitRIOL (ROCALTROL) capsule 1.5 mcg  1.5 mcg Oral Daily Etta Quill, DO   1.5 mcg at 09/16/14 0910  . calcium carbonate (OS-CAL - dosed in mg of elemental calcium) tablet 1,000 mg of elemental calcium  2 tablet Oral BID WC Etta Quill, DO   1,000 mg of elemental calcium at 09/16/14 1746  . [START ON 09/18/2014] ceFAZolin (ANCEF) IVPB 1 g/50 mL premix  1 g Intravenous On Call Angelia Mould, MD      . Darbepoetin Alfa (ARANESP) injection 200 mcg  200 mcg Intravenous Q Fri-HD Estanislado Emms, MD   200 mcg at 09/15/14 1814  . heparin injection 5,000 Units  5,000 Units Subcutaneous 3 times per day Etta Quill, DO   5,000 Units at 09/14/14 2334  . insulin aspart (novoLOG) injection 4 Units  4 Units Subcutaneous TID WC Etta Quill, DO   4 Units at 09/16/14 1746  . insulin detemir (LEVEMIR) injection 5 Units  5 Units Subcutaneous QHS Etta Quill, DO   5 Units at 09/16/14 2304  . mycophenolate (CELLCEPT) capsule 500 mg  500 mg Oral Daily Louis Meckel, MD   500 mg at 09/16/14 0910  . predniSONE (DELTASONE) tablet 5 mg  5 mg Oral Q breakfast Etta Quill, DO   5  mg at 09/16/14 0835  . sodium chloride 0.9 % injection 3 mL  3 mL Intravenous Q12H Etta Quill, DO   3 mL at 09/16/14 2150  . tacrolimus (PROGRAF) capsule 5 mg  5 mg Oral BID Estanislado Emms, MD   5 mg at 09/16/14 2149    REVIEW OF SYSTEMS: Valu.Nieves ] denotes positive finding; [  ] denotes negative finding CARDIOVASCULAR:  [ ]  chest pain   [ ]  chest pressure   [ ]  palpitations   [ ]  orthopnea   Valu.Nieves ] dyspnea on exertion   [ ]  claudication   [ ]  rest pain   [ ]  DVT   [ ]  phlebitis PULMONARY:   [ ]  productive cough   [ ]  asthma   [ ]  wheezing NEUROLOGIC:   [ ]  weakness  [ ]  paresthesias  [ ]  aphasia  [ ]  amaurosis  [ ]  dizziness HEMATOLOGIC:   [ ]  bleeding problems   [ ]  clotting  disorders MUSCULOSKELETAL:  [ ]  joint pain   [ ]  joint swelling [ ]  leg swelling GASTROINTESTINAL: [ ]   blood in stool  [ ]   hematemesis GENITOURINARY:  [ ]   dysuria  [ ]   hematuria PSYCHIATRIC:  [ ]  history of major depression INTEGUMENTARY:  [ ]  rashes  [ ]  ulcers CONSTITUTIONAL:  [ ]  fever   [ ]  chills  PHYSICAL EXAM: Filed Vitals:   09/16/14 1548 09/16/14 1717 09/16/14 2139 09/17/14 0415  BP: 125/78 117/79 128/72 121/76  Pulse: 82 68 75 78  Temp:  98.2 F (36.8 C) 99 F (37.2 C) 98.6 F (37 C)  TempSrc:  Oral Oral Oral  Resp: 19 20 18 16   Height:      Weight: 179 lb 10.8 oz (81.5 kg)     SpO2: 100% 100% 100% 98%   Body mass index is 28.57 kg/(m^2). GENERAL: The patient is a well-nourished male, in no acute distress. The vital signs are documented above. CARDIOVASCULAR: There is a regular rate and rhythm. He has palpable radial pulses bilaterally. PULMONARY: There is good air exchange bilaterally without wheezing or rales. ABDOMEN: Soft and non-tender with normal pitched bowel sounds.  MUSCULOSKELETAL: There are no major deformities or cyanosis. NEUROLOGIC: No focal weakness or paresthesias are detected. SKIN: There are no ulcers or rashes noted. PSYCHIATRIC: The patient has a normal affect.  DATA:  Lab Results  Component Value Date   WBC 5.5 09/16/2014   HGB 8.6* 09/16/2014   HCT 25.7* 09/16/2014   MCV 80.6 09/16/2014   PLT 121* 09/16/2014   Lab Results  Component Value Date   NA 139 09/16/2014   K 2.7* 09/16/2014   CL 109 09/16/2014   CO2 22 09/16/2014   Lab Results  Component Value Date   CREATININE 2.91* 09/16/2014   Lab Results  Component Value Date   INR 1.22 09/15/2014   Lab Results  Component Value Date   HGBA1C 5.6 06/13/2014   CBG (last 3)   Recent Labs  09/16/14 2136 09/16/14 2158 09/17/14 0056  GLUCAP 64* 70 147*   MEDICAL ISSUES:  STAGE V CHRONIC KIDNEY DISEASE: The patient is now on dialysis via a catheter. We have asked proceed  with placement of permanent access. I have recommended placement of a right upper arm graft. I have added this on for tomorrow's schedule. I have discussed the procedure and potential complications with the patient. He is agreeable to proceed.  [facility] [employer]  Beeper: [phone]

## 2014-09-17 NOTE — Progress Notes (Signed)
Subjective:  Feels and looks better once again- said threw up last night however Objective Vital signs in last 24 hours: Filed Vitals:   09/16/14 1717 09/16/14 2139 09/17/14 0415 09/17/14 0923  BP: 117/79 128/72 121/76 100/60  Pulse: 68 75 78 80  Temp: 98.2 F (36.8 C) 99 F (37.2 C) 98.6 F (37 C) 98.3 F (36.8 C)  TempSrc: Oral Oral Oral Oral  Resp: 20 18 16 17   Height:      Weight:      SpO2: 100% 100% 98% 100%   Weight change: -0.5 kg (-1 lb 1.6 oz)  Intake/Output Summary (Last 24 hours) at 09/17/14 1151 Last data filed at 09/17/14 0945  Gross per 24 hour  Intake    960 ml  Output     30 ml  Net    930 ml    Assessment/ Plan: Pt is a 59 y.o. yo male s/p renal transplant with rejection issues who was admitted on 09/14/2014 with basically uremia and anemia Assessment/Plan: 1. 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   Have contacted [facility] to see about placing another graft, now scheduled for tomorrow.   Will also start the CLIP- he lives close to [facility].  First HD 2/19, second 2/20 and third 2/22- to coordinate with [facility] as surgery also scheduled for tomorrow- on 4 K bath- labs pending for today to see if needs K supplement 2. HTN/volume- blood pressure is low on no meds- may be dry ? Definitely not edematous- running even with HD 3. Anemia- got blood with HD Friday- start aranesp as well 4. Secondary hyperparathyroidism- was on calcitriol as OP to continue- phos is 5 right now, will check PTH (pending) 5. S/P renal transplant- on prograf 5  BID, cellcept 500 BID and prednisone 5- have taken cellcept down  to daily and will continue to wean IS meds as OP   Patrick [name] A    Labs: Basic Metabolic Panel:  Recent Labs Lab 09/14/14 1816 09/15/14 0530 09/16/14 0705  NA 139 140 139  K 4.2 3.8 2.7*  CL 124* 124* 109  CO2 6* 11* 22   GLUCOSE 100* 89 111*  BUN 70* 66* 30*  CREATININE 5.04* 4.82* 2.91*  CALCIUM 8.1* 7.9* 7.4*  PHOS  --  5.0*  --    Liver Function Tests:  Recent Labs Lab 09/14/14 1816 09/15/14 0530 09/16/14 0705  AST 8  --  8  ALT 6  --  5  ALKPHOS 65  --  62  BILITOT 0.5  --  0.7  PROT 6.2  --  5.7*  ALBUMIN 3.7 3.4* 3.3*   No results for input(s): LIPASE, AMYLASE in the last 168 hours. No results for input(s): AMMONIA in the last 168 hours. CBC:  Recent Labs Lab 09/14/14 1816 09/15/14 0530 09/16/14 0705  WBC 5.6 5.2 5.5  NEUTROABS 4.3  --   --   HGB 7.1* 6.8* 8.6*  HCT 22.4* 21.5* 25.7*  MCV 86.8 85.0 80.6  PLT 169 161 121*   Cardiac Enzymes:  Recent Labs Lab 09/14/14 1816  TROPONINI <0.03   CBG:  Recent Labs Lab 09/16/14 2136 09/16/14 2158 09/17/14 0056 09/17/14 0737 09/17/14 1114  GLUCAP 64* 70 147* 102* 96    Iron Studies:   Recent Labs  09/15/14 1621  IRON 157*  TIBC NOT CALC  FERRITIN 1048*  Studies/Results: Ir Fluoro Guide Cv Line Left  09/15/2014   CLINICAL DATA:  End-stage renal disease, no current access.  EXAM: ULTRASOUND GUIDANCE FOR VASCULAR ACCESS  LEFT INTERNAL JUGULAR PERMANENT HEMODIALYSIS CATHETER  Date:  2/19/20162/19/2016 1:09 pm  Radiologist:  M. Daryll Brod, MD  Guidance:  Ultrasound and fluoroscopic  FLUOROSCOPY TIME:  42 seconds  MEDICATIONS AND MEDICAL HISTORY: 2 g Ancef administered within 1 hour of the procedure, 1 mg Versed, 50 mcg fentanyl  ANESTHESIA/SEDATION: 20 minutes  CONTRAST:  None  COMPLICATIONS: None immediate  PROCEDURE: Informed consent was obtained from the patient following explanation of the procedure, risks, benefits and alternatives. The patient understands, agrees and consents for the procedure. All questions were addressed. A time out was performed.  Maximal barrier sterile technique utilized including caps, mask, sterile gowns, sterile gloves, large sterile drape, hand hygiene, and 2% chlorhexidine scrub.  Under  sterile conditions and local anesthesia, left internal jugular micropuncture venous access was performed with ultrasound. Images were obtained for documentation. A guide wire was inserted followed by a transitional dilator. Next, a 0.035 guidewire was advanced into the IVC with a 5-French catheter. Measurements were obtained from the left venotomy site to the proximal right atrium. In the left infraclavicular chest, a subcutaneous tunnel was created under sterile conditions and local anesthesia. 1% lidocaine with epinephrine was utilized for this. The 23 cm tip to cuff catheter was tunneled subcutaneously to the venotomy site and inserted into the SVC/RA junction through a valved peel-away sheath. Position was confirmed with fluoroscopy. Images were obtained for documentation. Blood was aspirated from the catheter followed by saline and heparin flushes. The appropriate volume and strength of heparin was instilled in each lumen. Caps were applied. The catheter was secured at the tunnel site with Gelfoam and a pursestring suture. The venotomy site was closed with subcuticular Vicryl suture. Dermabond was applied to the small right neck incision. A dry sterile dressing was applied. The catheter is ready for use. No immediate complications.  IMPRESSION: Ultrasound and fluoroscopically guided left internal jugular tunneled hemodialysis catheter (23 cm tip to cuff catheter).   Electronically Signed   By: Jerilynn Mages.  Shick M.D.   On: 09/15/2014 14:09   Ir US Guide Vasc Access Left  09/15/2014   CLINICAL DATA:  End-stage renal disease, no current access.  EXAM: ULTRASOUND GUIDANCE FOR VASCULAR ACCESS  LEFT INTERNAL JUGULAR PERMANENT HEMODIALYSIS CATHETER  Date:  2/19/20162/19/2016 1:09 pm  Radiologist:  M. Daryll Brod, MD  Guidance:  Ultrasound and fluoroscopic  FLUOROSCOPY TIME:  42 seconds  MEDICATIONS AND MEDICAL HISTORY: 2 g Ancef administered within 1 hour of the procedure, 1 mg Versed, 50 mcg fentanyl   ANESTHESIA/SEDATION: 20 minutes  CONTRAST:  None  COMPLICATIONS: None immediate  PROCEDURE: Informed consent was obtained from the patient following explanation of the procedure, risks, benefits and alternatives. The patient understands, agrees and consents for the procedure. All questions were addressed. A time out was performed.  Maximal barrier sterile technique utilized including caps, mask, sterile gowns, sterile gloves, large sterile drape, hand hygiene, and 2% chlorhexidine scrub.  Under sterile conditions and local anesthesia, left internal jugular micropuncture venous access was performed with ultrasound. Images were obtained for documentation. A guide wire was inserted followed by a transitional dilator. Next, a 0.035 guidewire was advanced into the IVC with a 5-French catheter. Measurements were obtained from the left venotomy site to the proximal right atrium. In the left infraclavicular chest, a subcutaneous tunnel was created under sterile conditions and  local anesthesia. 1% lidocaine with epinephrine was utilized for this. The 23 cm tip to cuff catheter was tunneled subcutaneously to the venotomy site and inserted into the SVC/RA junction through a valved peel-away sheath. Position was confirmed with fluoroscopy. Images were obtained for documentation. Blood was aspirated from the catheter followed by saline and heparin flushes. The appropriate volume and strength of heparin was instilled in each lumen. Caps were applied. The catheter was secured at the tunnel site with Gelfoam and a pursestring suture. The venotomy site was closed with subcuticular Vicryl suture. Dermabond was applied to the small right neck incision. A dry sterile dressing was applied. The catheter is ready for use. No immediate complications.  IMPRESSION: Ultrasound and fluoroscopically guided left internal jugular tunneled hemodialysis catheter (23 cm tip to cuff catheter).   Electronically Signed   By: Jerilynn Mages.  Shick M.D.   On:  09/15/2014 14:09   Medications: Infusions:    Scheduled Medications: . sodium chloride   Intravenous Once  . aspirin EC  81 mg Oral Daily  . calcitRIOL  1.5 mcg Oral Daily  . calcium carbonate  2 tablet Oral BID WC  . [START ON 09/18/2014]  ceFAZolin (ANCEF) IV  1 g Intravenous On Call  . darbepoetin (ARANESP) injection - DIALYSIS  200 mcg Intravenous Q Fri-HD  . heparin  5,000 Units Subcutaneous 3 times per day  . insulin aspart  4 Units Subcutaneous TID WC  . insulin detemir  5 Units Subcutaneous QHS  . mycophenolate  500 mg Oral Daily  . predniSONE  5 mg Oral Q breakfast  . sodium chloride  3 mL Intravenous Q12H  . tacrolimus  5 mg Oral BID    have reviewed scheduled and prn medications.  Physical Exam: General: lying in bed- looks better Heart: RRR Lungs: clear Abdomen: soft, non tender Extremities: no edema Dialysis Access: PC placed 2/19   09/17/2014,11:51 AM  LOS: 3 days

## 2014-09-17 NOTE — Progress Notes (Signed)
Utilization review completed.  

## 2014-09-17 NOTE — Anesthesia Preprocedure Evaluation (Addendum)
Anesthesia Evaluation  Patient identified by MRN, date of birth, ID band Patient awake    Reviewed: Allergy & Precautions, NPO status , Patient's Chart, lab work & pertinent test results, reviewed documented beta blocker date and time   Airway Mallampati: I  TM Distance: >3 FB Neck ROM: Full    Dental  (+) Teeth Intact, Dental Advisory Given   Pulmonary neg pulmonary ROS,  breath sounds clear to auscultation        Cardiovascular hypertension, Rhythm:Regular  ECHO 11/2013 55%   Neuro/Psych negative neurological ROS  negative psych ROS   GI/Hepatic Neg liver ROS,   Endo/Other  diabetes, Poorly Controlled, Type 2, Insulin Dependent  Renal/GU DialysisRenal disease     Musculoskeletal   Abdominal (+)  Abdomen: soft.    Peds  Hematology  (+) anemia , 8.6/26   Anesthesia Other Findings   Reproductive/Obstetrics                          Anesthesia Physical Anesthesia Plan  ASA: III  Anesthesia Plan: General   Post-op Pain Management:    Induction: Intravenous  Airway Management Planned: Oral ETT  Additional Equipment:   Intra-op Plan:   Post-operative Plan: Extubation in OR  Informed Consent: I have reviewed the patients History and Physical, chart, labs and discussed the procedure including the risks, benefits and alternatives for the proposed anesthesia with the patient or authorized representative who has indicated his/her understanding and acceptance.     Plan Discussed with:   Anesthesia Plan Comments:         Anesthesia Quick Evaluation

## 2014-09-18 ENCOUNTER — Encounter (HOSPITAL_COMMUNITY)
Admission: EM | Disposition: A | Discharge status: Home / Self Care | Payer: Self-pay | Source: Home / Self Care | Attending: Internal Medicine

## 2014-09-18 ENCOUNTER — Encounter (HOSPITAL_COMMUNITY): Payer: Self-pay | Admitting: Certified Registered"

## 2014-09-18 ENCOUNTER — Inpatient Hospital Stay (HOSPITAL_COMMUNITY): Payer: Medicare Other | Admitting: Anesthesiology

## 2014-09-18 DIAGNOSIS — N185 Chronic kidney disease, stage 5: Secondary | ICD-10-CM

## 2014-09-18 HISTORY — PX: AV FISTULA PLACEMENT: SHX1204

## 2014-09-18 LAB — COMPREHENSIVE METABOLIC PANEL
ALK PHOS: 67 U/L (ref 39–117)
ALT: 5 U/L (ref 0–53)
ANION GAP: 7 (ref 5–15)
AST: 12 U/L (ref 0–37)
Albumin: 3.3 g/dL — ABNORMAL LOW (ref 3.5–5.2)
BILIRUBIN TOTAL: 0.4 mg/dL (ref 0.3–1.2)
BUN: 19 mg/dL (ref 6–23)
CHLORIDE: 108 mmol/L (ref 96–112)
CO2: 24 mmol/L (ref 19–32)
CREATININE: 3.67 mg/dL — AB (ref 0.50–1.35)
Calcium: 7.7 mg/dL — ABNORMAL LOW (ref 8.4–10.5)
GFR, EST AFRICAN AMERICAN: 19 mL/min — AB (ref >90)
GFR, EST NON AFRICAN AMERICAN: 17 mL/min — AB (ref >90)
Glucose, Bld: 137 mg/dL — ABNORMAL HIGH (ref 70–99)
Potassium: 2.8 mmol/L — ABNORMAL LOW (ref 3.5–5.1)
Sodium: 139 mmol/L (ref 135–145)
Total Protein: 5.7 g/dL — ABNORMAL LOW (ref 6.0–8.3)

## 2014-09-18 LAB — GLUCOSE, CAPILLARY
GLUCOSE-CAPILLARY: 117 mg/dL — AB (ref 70–99)
Glucose-Capillary: 99 mg/dL (ref 70–99)
Glucose-Capillary: 112 mg/dL — ABNORMAL HIGH (ref 70–99)
Glucose-Capillary: 113 mg/dL — ABNORMAL HIGH (ref 70–99)

## 2014-09-18 LAB — CBC
HCT: 26.2 % — ABNORMAL LOW (ref 39.0–52.0)
HEMOGLOBIN: 8.4 g/dL — AB (ref 13.0–17.0)
MCH: 26.8 pg (ref 26.0–34.0)
MCHC: 32.1 g/dL (ref 30.0–36.0)
MCV: 83.7 fL (ref 78.0–100.0)
Platelets: 111 10*3/uL — ABNORMAL LOW (ref 150–400)
RBC: 3.13 MIL/uL — ABNORMAL LOW (ref 4.22–5.81)
RDW: 18 % — ABNORMAL HIGH (ref 11.5–15.5)
WBC: 6.2 10*3/uL (ref 4.0–10.5)

## 2014-09-18 SURGERY — INSERTION OF ARTERIOVENOUS (AV) GORE-TEX GRAFT ARM
Anesthesia: General | Site: Arm Upper | Laterality: Right

## 2014-09-18 MED ORDER — PHENYLEPHRINE HCL 10 MG/ML IJ SOLN
INTRAMUSCULAR | Status: DC | PRN
  Administered 2014-09-18: 80 ug via INTRAVENOUS
  Administered 2014-09-18 (×2): 40 ug via INTRAVENOUS
  Administered 2014-09-18 (×2): 80 ug via INTRAVENOUS

## 2014-09-18 MED ORDER — LIDOCAINE HCL (CARDIAC) 20 MG/ML IV SOLN
INTRAVENOUS | Status: AC
  Filled 2014-09-18: qty 5

## 2014-09-18 MED ORDER — FENTANYL CITRATE 0.05 MG/ML IJ SOLN
INTRAMUSCULAR | Status: AC
  Filled 2014-09-18: qty 5

## 2014-09-18 MED ORDER — MEPERIDINE HCL 25 MG/ML IJ SOLN: 6.2500 mg | INTRAMUSCULAR | Status: DC | PRN

## 2014-09-18 MED ORDER — EPHEDRINE SULFATE 50 MG/ML IJ SOLN
INTRAMUSCULAR | Status: DC | PRN
  Administered 2014-09-18 (×3): 5 mg via INTRAVENOUS

## 2014-09-18 MED ORDER — FENTANYL CITRATE 0.05 MG/ML IJ SOLN: 25.0000 ug | INTRAMUSCULAR | Status: DC | PRN

## 2014-09-18 MED ORDER — LIDOCAINE-EPINEPHRINE 0.5 %-1:200000 IJ SOLN
INTRAMUSCULAR | Status: AC
  Filled 2014-09-18: qty 1

## 2014-09-18 MED ORDER — 0.9 % SODIUM CHLORIDE (POUR BTL) OPTIME
TOPICAL | Status: DC | PRN
  Administered 2014-09-18: 1000 mL

## 2014-09-18 MED ORDER — HEPARIN SODIUM (PORCINE) 1000 UNIT/ML IJ SOLN
INTRAMUSCULAR | Status: DC | PRN
  Administered 2014-09-18: 2.1 mL via INTRAVENOUS

## 2014-09-18 MED ORDER — ARTIFICIAL TEARS OP OINT
TOPICAL_OINTMENT | OPHTHALMIC | Status: AC
  Filled 2014-09-18: qty 3.5

## 2014-09-18 MED ORDER — PROMETHAZINE HCL 25 MG/ML IJ SOLN: 6.2500 mg | INTRAMUSCULAR | Status: DC | PRN

## 2014-09-18 MED ORDER — GLYCOPYRROLATE 0.2 MG/ML IJ SOLN
INTRAMUSCULAR | Status: AC
  Filled 2014-09-18: qty 3

## 2014-09-18 MED ORDER — MIDAZOLAM HCL 2 MG/2ML IJ SOLN
INTRAMUSCULAR | Status: AC
  Filled 2014-09-18: qty 2

## 2014-09-18 MED ORDER — ONDANSETRON HCL 4 MG/2ML IJ SOLN
INTRAMUSCULAR | Status: DC | PRN
  Administered 2014-09-18: 4 mg via INTRAVENOUS

## 2014-09-18 MED ORDER — ONDANSETRON HCL 4 MG/2ML IJ SOLN
INTRAMUSCULAR | Status: AC
  Filled 2014-09-18: qty 2

## 2014-09-18 MED ORDER — NEOSTIGMINE METHYLSULFATE 10 MG/10ML IV SOLN
INTRAVENOUS | Status: AC
  Filled 2014-09-18: qty 1

## 2014-09-18 MED ORDER — PHENYLEPHRINE 40 MCG/ML (10ML) SYRINGE FOR IV PUSH (FOR BLOOD PRESSURE SUPPORT)
PREFILLED_SYRINGE | INTRAVENOUS | Status: AC
  Filled 2014-09-18: qty 10

## 2014-09-18 MED ORDER — OXYCODONE-ACETAMINOPHEN 5-325 MG PO TABS
1.0000 | ORAL_TABLET | ORAL | Status: DC | PRN
  Administered 2014-09-18 – 2014-09-19 (×3): 2 via ORAL
  Filled 2014-09-18 (×3): qty 2

## 2014-09-18 MED ORDER — ONDANSETRON HCL 4 MG/2ML IJ SOLN: 4.0000 mg | Freq: Four times a day (QID) | INTRAMUSCULAR | Status: DC | PRN

## 2014-09-18 MED ORDER — ROCURONIUM BROMIDE 100 MG/10ML IV SOLN
INTRAVENOUS | Status: DC | PRN
  Administered 2014-09-18: 30 mg via INTRAVENOUS

## 2014-09-18 MED ORDER — NEOSTIGMINE METHYLSULFATE 10 MG/10ML IV SOLN
INTRAVENOUS | Status: DC | PRN
  Administered 2014-09-18: 4 mg via INTRAVENOUS

## 2014-09-18 MED ORDER — ONDANSETRON HCL 4 MG/2ML IJ SOLN
INTRAMUSCULAR | Status: AC
  Administered 2014-09-18: 4 mg
  Filled 2014-09-18: qty 2

## 2014-09-18 MED ORDER — SODIUM CHLORIDE 0.9 % IR SOLN
Status: DC | PRN
  Administered 2014-09-18: 500 mL

## 2014-09-18 MED ORDER — GLYCOPYRROLATE 0.2 MG/ML IJ SOLN
INTRAMUSCULAR | Status: DC | PRN
  Administered 2014-09-18: 0.6 mg via INTRAVENOUS

## 2014-09-18 MED ORDER — FENTANYL CITRATE 0.05 MG/ML IJ SOLN
INTRAMUSCULAR | Status: DC | PRN
  Administered 2014-09-18: 50 ug via INTRAVENOUS
  Administered 2014-09-18: 100 ug via INTRAVENOUS

## 2014-09-18 MED ORDER — LIDOCAINE HCL (CARDIAC) 20 MG/ML IV SOLN
INTRAVENOUS | Status: DC | PRN
  Administered 2014-09-18: 100 mg via INTRAVENOUS

## 2014-09-18 MED ORDER — PROPOFOL 10 MG/ML IV BOLUS
INTRAVENOUS | Status: DC | PRN
Start: 2014-09-18 — End: 2014-09-18
  Administered 2014-09-18: 120 mg via INTRAVENOUS

## 2014-09-18 MED ORDER — PROPOFOL 10 MG/ML IV BOLUS
INTRAVENOUS | Status: AC
  Filled 2014-09-18: qty 20

## 2014-09-18 MED ORDER — EPHEDRINE SULFATE 50 MG/ML IJ SOLN
INTRAMUSCULAR | Status: AC
  Filled 2014-09-18: qty 1

## 2014-09-18 MED ORDER — ROCURONIUM BROMIDE 50 MG/5ML IV SOLN
INTRAVENOUS | Status: AC
  Filled 2014-09-18: qty 1

## 2014-09-18 MED ORDER — POTASSIUM CHLORIDE CRYS ER 20 MEQ PO TBCR
40.0000 meq | EXTENDED_RELEASE_TABLET | Freq: Once | ORAL | Status: AC
  Administered 2014-09-18: 40 meq via ORAL
  Filled 2014-09-18 (×2): qty 2

## 2014-09-18 MED ORDER — ARTIFICIAL TEARS OP OINT
TOPICAL_OINTMENT | OPHTHALMIC | Status: DC | PRN
  Administered 2014-09-18: 1 via OPHTHALMIC

## 2014-09-18 MED ORDER — SODIUM CHLORIDE 0.9 % IJ SOLN
INTRAMUSCULAR | Status: AC
  Filled 2014-09-18: qty 10

## 2014-09-18 SURGICAL SUPPLY — 39 items
ARMBAND PINK RESTRICT EXTREMIT (MISCELLANEOUS) ×3 IMPLANT
BENZOIN TINCTURE PRP APPL 2/3 (GAUZE/BANDAGES/DRESSINGS) ×6 IMPLANT
CANISTER SUCTION 2500CC (MISCELLANEOUS) ×3 IMPLANT
CANNULA VESSEL 3MM 2 BLNT TIP (CANNULA) ×3 IMPLANT
CLIP LIGATING EXTRA MED SLVR (CLIP) ×3 IMPLANT
CLIP LIGATING EXTRA SM BLUE (MISCELLANEOUS) ×3 IMPLANT
CLOSURE STERI-STRIP 1/2X4 (GAUZE/BANDAGES/DRESSINGS) ×1
CLOSURE WOUND 1/2 X4 (GAUZE/BANDAGES/DRESSINGS) ×1
CLSR STERI-STRIP ANTIMIC 1/2X4 (GAUZE/BANDAGES/DRESSINGS) ×2 IMPLANT
DECANTER SPIKE VIAL GLASS SM (MISCELLANEOUS) ×3 IMPLANT
ELECT REM PT RETURN 9FT ADLT (ELECTROSURGICAL) ×3
ELECTRODE REM PT RTRN 9FT ADLT (ELECTROSURGICAL) ×1 IMPLANT
GAUZE SPONGE 4X4 12PLY STRL (GAUZE/BANDAGES/DRESSINGS) ×3 IMPLANT
GEL ULTRASOUND 20GR AQUASONIC (MISCELLANEOUS) IMPLANT
GLOVE BIOGEL PI IND STRL 6.5 (GLOVE) ×2 IMPLANT
GLOVE BIOGEL PI IND STRL 8 (GLOVE) ×1 IMPLANT
GLOVE BIOGEL PI INDICATOR 6.5 (GLOVE) ×4
GLOVE BIOGEL PI INDICATOR 8 (GLOVE) ×2
GLOVE SS BIOGEL STRL SZ 7.5 (GLOVE) ×1 IMPLANT
GLOVE SS N UNI LF 6.0 STRL (GLOVE) ×3 IMPLANT
GLOVE SUPERSENSE BIOGEL SZ 7.5 (GLOVE) ×2
GOWN STRL REUS W/ TWL LRG LVL3 (GOWN DISPOSABLE) ×3 IMPLANT
GOWN STRL REUS W/TWL LRG LVL3 (GOWN DISPOSABLE) ×6
GRAFT GORETEX STRT 4-7X45 (Vascular Products) ×3 IMPLANT
KIT BASIN OR (CUSTOM PROCEDURE TRAY) ×3 IMPLANT
KIT ROOM TURNOVER OR (KITS) ×3 IMPLANT
NS IRRIG 1000ML POUR BTL (IV SOLUTION) ×3 IMPLANT
PACK CV ACCESS (CUSTOM PROCEDURE TRAY) ×3 IMPLANT
PAD ARMBOARD 7.5X6 YLW CONV (MISCELLANEOUS) ×6 IMPLANT
PROBE PENCIL 8 MHZ STRL DISP (MISCELLANEOUS) IMPLANT
SPONGE GAUZE 4X4 12PLY STER LF (GAUZE/BANDAGES/DRESSINGS) ×3 IMPLANT
STRIP CLOSURE SKIN 1/2X4 (GAUZE/BANDAGES/DRESSINGS) ×2 IMPLANT
SUT PROLENE 6 0 CC (SUTURE) ×6 IMPLANT
SUT SILK 2 0 FS (SUTURE) IMPLANT
SUT VIC AB 3-0 SH 27 (SUTURE) ×4
SUT VIC AB 3-0 SH 27X BRD (SUTURE) ×2 IMPLANT
TAPE CLOTH SURG 4X10 WHT LF (GAUZE/BANDAGES/DRESSINGS) ×3 IMPLANT
UNDERPAD 30X30 INCONTINENT (UNDERPADS AND DIAPERS) ×3 IMPLANT
WATER STERILE IRR 1000ML POUR (IV SOLUTION) ×3 IMPLANT

## 2014-09-18 NOTE — Anesthesia Postprocedure Evaluation (Signed)
  Anesthesia Post-op Note  Patient: Patrick [name]  Procedure(s) Performed: Procedure(s): INSERTION OF ARTERIOVENOUS (AV) GORE-TEX GRAFT ARM USING 4-7MM  X 45CM STRETCH GORE-TEX VASCULAR GRAFT (Right)  Patient Location: PACU  Anesthesia Type:General  Level of Consciousness: awake and alert   Airway and Oxygen Therapy: Patient Spontanous Breathing and Patient connected to nasal cannula oxygen  Post-op Pain: mild  Post-op Assessment: Post-op Vital signs reviewed, Patient's Cardiovascular Status Stable, Respiratory Function Stable, Patent Airway and No signs of Nausea or vomiting  Post-op Vital Signs: Reviewed and stable  Last Vitals:  Filed Vitals:   09/18/14 0945  BP: 119/68  Pulse: 64  Temp:   Resp: 17    Complications: No apparent anesthesia complications

## 2014-09-18 NOTE — H&P (View-Only) (Signed)
Vascular and Vein Specialist of [facility]  Patient name: Patrick [name] MRN: [id number] DOB: 06/09/1956 Sex: male  REASON FOR CONSULT: Patient now agreeable to hemodialysis access  HPI: Patrick [name] is a 59 y.o. male who I evaluated in the office on 08/02/2014 for hemodialysis access. He had a cadaveric renal transplant which ultimately failed. He was admitted with renal failure and a catheter has been placed by interventional radiology. He is now agreeable to placement of a graft. He had previously had an attempted right radiocephalic fistula in [facility] which failed. He then had a graft in his right forearm. He subsequently had a left radiocephalic fistula which failed and had a left forearm graft. This was all done elsewhere. I recommended placement of a right upper arm AV graft. However initially he was not agreeable to proceed.  In our office he underwent a vein mapping which showed that his cephalic veins in both arms were not adequate area and likewise the basilic veins were not adequate. I did not think he was a candidate for a AV fistula.  He is admitted now with progressive renal insufficiency.  Past Medical History  Diagnosis Date  . Hypertension   . Diabetes mellitus without complication   . End-stage renal failure with renal transplant   . Gout   . Impotence of organic origin   . Type I (juvenile type) diabetes mellitus with renal manifestations, not stated as uncontrolled   . Secondary hyperparathyroidism     No family history on file.  SOCIAL HISTORY: History  Substance Use Topics  . Smoking status: Never Smoker   . Smokeless tobacco: Not on file  . Alcohol Use: No    Allergies  Allergen Reactions  . Iodinated Diagnostic Agents Rash    Other Reaction: burning to mouth, swelling of lips.    Current Facility-Administered Medications  Medication Dose Route Frequency Provider Last Rate Last Dose  . 0.9 %  sodium chloride infusion   Intravenous  Once Reyne Dumas, MD      . aspirin EC tablet 81 mg  81 mg Oral Daily Etta Quill, DO   81 mg at 09/16/14 0910  . calcitRIOL (ROCALTROL) capsule 1.5 mcg  1.5 mcg Oral Daily Etta Quill, DO   1.5 mcg at 09/16/14 0910  . calcium carbonate (OS-CAL - dosed in mg of elemental calcium) tablet 1,000 mg of elemental calcium  2 tablet Oral BID WC Etta Quill, DO   1,000 mg of elemental calcium at 09/16/14 1746  . [START ON 09/18/2014] ceFAZolin (ANCEF) IVPB 1 g/50 mL premix  1 g Intravenous On Call Angelia Mould, MD      . Darbepoetin Alfa (ARANESP) injection 200 mcg  200 mcg Intravenous Q Fri-HD Estanislado Emms, MD   200 mcg at 09/15/14 1814  . heparin injection 5,000 Units  5,000 Units Subcutaneous 3 times per day Etta Quill, DO   5,000 Units at 09/14/14 2334  . insulin aspart (novoLOG) injection 4 Units  4 Units Subcutaneous TID WC Etta Quill, DO   4 Units at 09/16/14 1746  . insulin detemir (LEVEMIR) injection 5 Units  5 Units Subcutaneous QHS Etta Quill, DO   5 Units at 09/16/14 2304  . mycophenolate (CELLCEPT) capsule 500 mg  500 mg Oral Daily Louis Meckel, MD   500 mg at 09/16/14 0910  . predniSONE (DELTASONE) tablet 5 mg  5 mg Oral Q breakfast Etta Quill, DO   5  mg at 09/16/14 0835  . sodium chloride 0.9 % injection 3 mL  3 mL Intravenous Q12H Etta Quill, DO   3 mL at 09/16/14 2150  . tacrolimus (PROGRAF) capsule 5 mg  5 mg Oral BID Estanislado Emms, MD   5 mg at 09/16/14 2149    REVIEW OF SYSTEMS: Valu.Nieves ] denotes positive finding; [  ] denotes negative finding CARDIOVASCULAR:  [ ]  chest pain   [ ]  chest pressure   [ ]  palpitations   [ ]  orthopnea   Valu.Nieves ] dyspnea on exertion   [ ]  claudication   [ ]  rest pain   [ ]  DVT   [ ]  phlebitis PULMONARY:   [ ]  productive cough   [ ]  asthma   [ ]  wheezing NEUROLOGIC:   [ ]  weakness  [ ]  paresthesias  [ ]  aphasia  [ ]  amaurosis  [ ]  dizziness HEMATOLOGIC:   [ ]  bleeding problems   [ ]  clotting  disorders MUSCULOSKELETAL:  [ ]  joint pain   [ ]  joint swelling [ ]  leg swelling GASTROINTESTINAL: [ ]   blood in stool  [ ]   hematemesis GENITOURINARY:  [ ]   dysuria  [ ]   hematuria PSYCHIATRIC:  [ ]  history of major depression INTEGUMENTARY:  [ ]  rashes  [ ]  ulcers CONSTITUTIONAL:  [ ]  fever   [ ]  chills  PHYSICAL EXAM: Filed Vitals:   09/16/14 1548 09/16/14 1717 09/16/14 2139 09/17/14 0415  BP: 125/78 117/79 128/72 121/76  Pulse: 82 68 75 78  Temp:  98.2 F (36.8 C) 99 F (37.2 C) 98.6 F (37 C)  TempSrc:  Oral Oral Oral  Resp: 19 20 18 16   Height:      Weight: 179 lb 10.8 oz (81.5 kg)     SpO2: 100% 100% 100% 98%   Body mass index is 28.57 kg/(m^2). GENERAL: The patient is a well-nourished male, in no acute distress. The vital signs are documented above. CARDIOVASCULAR: There is a regular rate and rhythm. He has palpable radial pulses bilaterally. PULMONARY: There is good air exchange bilaterally without wheezing or rales. ABDOMEN: Soft and non-tender with normal pitched bowel sounds.  MUSCULOSKELETAL: There are no major deformities or cyanosis. NEUROLOGIC: No focal weakness or paresthesias are detected. SKIN: There are no ulcers or rashes noted. PSYCHIATRIC: The patient has a normal affect.  DATA:  Lab Results  Component Value Date   WBC 5.5 09/16/2014   HGB 8.6* 09/16/2014   HCT 25.7* 09/16/2014   MCV 80.6 09/16/2014   PLT 121* 09/16/2014   Lab Results  Component Value Date   NA 139 09/16/2014   K 2.7* 09/16/2014   CL 109 09/16/2014   CO2 22 09/16/2014   Lab Results  Component Value Date   CREATININE 2.91* 09/16/2014   Lab Results  Component Value Date   INR 1.22 09/15/2014   Lab Results  Component Value Date   HGBA1C 5.6 06/13/2014   CBG (last 3)   Recent Labs  09/16/14 2136 09/16/14 2158 09/17/14 0056  GLUCAP 64* 70 147*   MEDICAL ISSUES:  STAGE V CHRONIC KIDNEY DISEASE: The patient is now on dialysis via a catheter. We have asked proceed  with placement of permanent access. I have recommended placement of a right upper arm graft. I have added this on for tomorrow's schedule. I have discussed the procedure and potential complications with the patient. He is agreeable to proceed.  [place] [facility] [employer]  Beeper: [phone]

## 2014-09-18 NOTE — Procedures (Signed)
Tolerating hemodialysis, keeping even and BP ok without meds; has AVGG in RUE placed today that looks good Is scheduled for [facility] on MWF.  He is stable for discharge when cleared by Medicine Team.   Assessment/Plan: 1. Renal- s/p DDKT at [facility] in 2008- s/p rejection episode in 2015 left with advanced CKD- now with symptomatic uremia requiring initiation of dialysis- PC placed by IR 2/19. First HD 2/19, second 2/20 and third today 2. HTN/volume- blood pressure is low on no meds- running even with HD 3. Anemia- got blood with HD Friday- start aranesp as well 4. Secondary hyperparathyroidism- was on calcitriol as OP to continue- phos is 5 right now, will check PTH (pending) 5. S/P renal transplant- on prograf 5 BID, cellcept 500 BID and prednisone 5- have taken cellcept down to daily.  Next wean in about a month.   Patrick [name]

## 2014-09-18 NOTE — Progress Notes (Signed)
09/18/2014 3:33 PM Hemodialysis Outpatient Note; this patient has been accepted at the [facility] on a Monday, Wednesday and Friday 2nd shift schedule. The center can begin treatment on Wednesday 02.24.16 at 10:30 AM. Thank you. Gordy Savers

## 2014-09-18 NOTE — Interval H&P Note (Signed)
History and Physical Interval Note:  09/18/2014 7:00 AM  Patrick [name]  has presented today for surgery, with the diagnosis of [facility] Violet stage renal disease  The various methods of treatment have been discussed with the patient and family. After consideration of risks, benefits and other options for treatment, the patient has consented to  Procedure(s): INSERTION OF ARTERIOVENOUS (AV) GORE-TEX GRAFT ARM (Right) as a surgical intervention .  The patient's history has been reviewed, patient examined, no change in status, stable for surgery.  I have reviewed the patient's chart and labs.  Questions were answered to the patient's satisfaction.     [name]

## 2014-09-18 NOTE — Progress Notes (Addendum)
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 Seen by Dr. Scot Dock, vascular surgery for right upper arm graft placement on 2/22 Is scheduled for [facility] on MWF First HD 2/19, second 2/20, third  2/22 Nephrology to make decisions about mycophenolate, Prograf, prednisone;on prograf 5 BID, cellcept 500 BID and prednisone 5- have taken cellcept down to daily. Next wean in about a month.    Hypertension, borderline low, will he need MIDODRINE?  Continue to hold Norvasc, Coreg Anticipate discharge tomorrow IF  blood pressure stable post hemodialysis  Diabetes stable, continue NovoLog and Levemir, stable Repeat hemoglobin A1c   Anemia Status post 2 units of packed red blood cells 2/19 6.8> 8.4 Anticipate discharge tomorrow if hemoglobin is stable in the morning  Hypokalemia, potassium 2.8 Will replete, nephrology to adjust during dialysis   Code Status: full Family Communication: family updated about patient's clinical progress Disposition Plan: AV graft 2/22, [facility] on MWF      Brief narrative: 59 y.o. male with a history of ESRD s/p DDKT 2008 at [facility]. He had a bout of acute vascular rejection in April 2015 with a creainine peakin at About 8mg /dl and improving to 3.45 on 11/29/13. In April 2015 he was hospitalized with a hemoglobin of 6.5gram treated with PRBCs. No GI  work up done. On Jun 20 2014 creat was 3.39mg /dl and prograf level was not detectable. He did not go for the repeat prograf level. He c/o malaise, decreased appetitie, dizziness upon standing. He has a bad taste in his mouth. He has lost almost 30 pounds since November. He reports dyspnea with exertion, SOB at rest and wife reports he is hyperventilating. He was sent to the emergency room due to labs revealing a bicarbonate level of 8, creat of 4.85 and hemoglobin of 7.5gms. He had an access that clotted a few months ago and Dr. Scot Dock wanted to hold off insertion until closer to needing dialysis.  Consultants:  Nephrology  Radiology  Vascular surgery    Procedures:  None  Antibiotics: None  HPI/Subjective: No complaints, feeling well nausea last night  Objective: Filed Vitals:   09/18/14 1200 09/18/14 1230 09/18/14 1300 09/18/14 1330  BP: 116/65 113/66 104/64 103/49  Pulse: 82 75 71 75  Temp:      TempSrc:      Resp: 16 16 17 18   Height:      Weight:      SpO2:        Intake/Output Summary (Last 24 hours) at 09/18/14 1354 Last data filed at 09/18/14 0914  Gross per 24 hour  Intake    710 ml  Output      0 ml  Net    710 ml    Exam:  General: Appears comfortable Lungs: By basilar crackles Cardiovascular: Regular rate and rhythm without murmur gallop or rub normal S1 and S2 Abdomen: Nontender, nondistended, soft, bowel sounds positive, no rebound, no ascites, no appreciable mass Extremities: No  significant cyanosis, clubbing, or edema bilateral lower extremities      Data Reviewed: Basic Metabolic Panel:  Recent Labs Lab 09/14/14 1816 09/15/14 0530 09/16/14 0705 09/17/14 1105 09/18/14 0500  NA 139 140 139 140 139  K 4.2 3.8 2.7* 3.0* 2.8*  CL 124* 124* 109 110 108  CO2 6* 11* 22 26 24   GLUCOSE 100* 89 111* 103* 137*  BUN 70* 66* 30* 15 19  CREATININE 5.04* 4.82* 2.91* 2.88* 3.67*  CALCIUM 8.1* 7.9* 7.4* 8.1* 7.7*  PHOS  --  5.0*  --   --    --     Liver Function Tests:  Recent Labs Lab 09/14/14 1816 09/15/14 0530 09/16/14 0705 09/17/14 1105 09/18/14 0500  AST 8  --  8 11 12   ALT 6  --  5 6 5   ALKPHOS 65  --  62 66 67  BILITOT 0.5  --  0.7 0.3 0.4  PROT 6.2  --  5.7* 5.6* 5.7*  ALBUMIN 3.7 3.4* 3.3* 3.3* 3.3*   No results for input(s): LIPASE, AMYLASE in the last 168 hours. No results for input(s): AMMONIA in the last 168 hours.  CBC:  Recent Labs Lab 09/14/14 1816 09/15/14 0530 09/16/14 0705 09/18/14 0500  WBC 5.6 5.2 5.5 6.2  NEUTROABS 4.3  --   --   --   HGB 7.1* 6.8* 8.6* 8.4*  HCT 22.4* 21.5* 25.7* 26.2*  MCV 86.8 85.0 80.6 83.7  PLT 169 161 121* 111*    Cardiac Enzymes:  Recent Labs Lab 09/14/14 1816  TROPONINI <0.03   BNP (last 3 results) No results for input(s): BNP in the last 8760 hours.  ProBNP (last 3 results) No results for input(s): PROBNP in the last 8760 hours.    CBG:  Recent Labs Lab 09/17/14 1114 09/17/14 1640 09/17/14 2120 09/18/14 0706 09/18/14 0923  GLUCAP 96 111* 138* 99 117*    Recent Results (from the past 240 hour(s))  Surgical pcr screen     Status: None   Collection Time: 09/17/14  8:40 PM  Result Value Ref Range Status   MRSA, PCR NEGATIVE NEGATIVE Final   Staphylococcus aureus NEGATIVE NEGATIVE Final    Comment:        The Xpert SA Assay (FDA approved for NASAL specimens in patients over 30 years of age), is one component of a comprehensive surveillance program.  Test performance has been validated by [facility] for patients greater than or equal to 92 year old. It is not intended to diagnose infection nor to guide or monitor treatment.      Studies: Dg Chest 2 View  09/14/2014   CLINICAL DATA:  Pt sen here by Dr Erling Cruz for abnormal labs, sob and weight loss. Pt had kidney transplant in 2009 and per Dr Florene Glen he believes the kidney is failing. [facility] states they faxed over all labs. Pt has hx of HTN, diabetes. Non-Smoker.   EXAM: CHEST  2 VIEW  COMPARISON:  None.  FINDINGS: There are changes from a previous median sternotomy. Cardiac silhouette is mildly enlarged. No mediastinal or hilar masses. No convincing adenopathy.  Lungs are clear.  No pleural effusion or pneumothorax.  Bony thorax is unremarkable.  IMPRESSION: No acute cardiopulmonary disease.   Electronically Signed   By: Lajean Manes M.D.   On: 09/14/2014 18:06   Ir Fluoro Guide Cv Line Left  09/15/2014   CLINICAL DATA:  End-stage renal disease, no current access.  EXAM: ULTRASOUND GUIDANCE FOR VASCULAR  ACCESS  LEFT INTERNAL JUGULAR PERMANENT HEMODIALYSIS CATHETER  Date:  2/19/20162/19/2016 1:09 pm  Radiologist:  M. Daryll Brod, MD  Guidance:  Ultrasound and fluoroscopic  FLUOROSCOPY TIME:  42 seconds  MEDICATIONS AND MEDICAL HISTORY: 2 g Ancef administered within 1 hour of the procedure, 1 mg Versed, 50 mcg fentanyl  ANESTHESIA/SEDATION: 20 minutes  CONTRAST:  None  COMPLICATIONS: None immediate  PROCEDURE: Informed consent was obtained from the patient following explanation of the procedure, risks, benefits and alternatives. The patient understands, agrees and consents for the procedure. All questions were addressed. A time out was performed.  Maximal barrier sterile technique utilized including caps, mask, sterile gowns, sterile gloves, large sterile drape, hand hygiene, and 2% chlorhexidine scrub.  Under sterile conditions and local anesthesia, left internal jugular micropuncture venous access was performed with ultrasound. Images were obtained for documentation. A guide wire was inserted followed by a transitional dilator. Next, a 0.035 guidewire was advanced into the IVC with a 5-French catheter. Measurements were obtained from the left venotomy site to the proximal right atrium. In the left infraclavicular chest, a subcutaneous tunnel was created under sterile conditions and local anesthesia. 1% lidocaine with epinephrine was utilized for this. The 23 cm tip to  cuff catheter was tunneled subcutaneously to the venotomy site and inserted into the SVC/RA junction through a valved peel-away sheath. Position was confirmed with fluoroscopy. Images were obtained for documentation. Blood was aspirated from the catheter followed by saline and heparin flushes. The appropriate volume and strength of heparin was instilled in each lumen. Caps were applied. The catheter was secured at the tunnel site with Gelfoam and a pursestring suture. The venotomy site was closed with subcuticular Vicryl suture. Dermabond was applied to the small right neck incision. A dry sterile dressing was applied. The catheter is ready for use. No immediate complications.  IMPRESSION: Ultrasound and fluoroscopically guided left internal jugular tunneled hemodialysis catheter (23 cm tip to cuff catheter).   Electronically Signed   By: Jerilynn Mages.  Shick M.D.   On: 09/15/2014 14:09   Ir US Guide Vasc Access Left  09/15/2014   CLINICAL DATA:  End-stage renal disease, no current access.  EXAM: ULTRASOUND GUIDANCE FOR VASCULAR ACCESS  LEFT INTERNAL JUGULAR PERMANENT HEMODIALYSIS CATHETER  Date:  2/19/20162/19/2016 1:09 pm  Radiologist:  M. Daryll Brod, MD  Guidance:  Ultrasound and fluoroscopic  FLUOROSCOPY TIME:  42 seconds  MEDICATIONS AND MEDICAL HISTORY: 2 g Ancef administered within 1 hour of the procedure, 1 mg Versed, 50 mcg fentanyl  ANESTHESIA/SEDATION: 20 minutes  CONTRAST:  None  COMPLICATIONS: None immediate  PROCEDURE: Informed consent was obtained from the patient following explanation of the procedure, risks, benefits and alternatives. The patient understands, agrees and consents for the procedure. All questions were addressed. A time out was performed.  Maximal barrier sterile technique utilized including caps, mask, sterile gowns, sterile gloves, large sterile drape, hand hygiene, and 2% chlorhexidine scrub.  Under sterile conditions and local anesthesia, left internal jugular micropuncture venous access  was performed with ultrasound. Images were obtained for documentation. A guide wire was inserted followed by a transitional dilator. Next, a 0.035 guidewire was advanced into the IVC with a 5-French catheter. Measurements were obtained from the left venotomy site to the proximal right atrium. In the left infraclavicular chest, a subcutaneous tunnel was created under sterile conditions and local anesthesia. 1% lidocaine with epinephrine was utilized for this. The 23 cm tip to cuff catheter was tunneled subcutaneously to the venotomy site and inserted  into the SVC/RA junction through a valved peel-away sheath. Position was confirmed with fluoroscopy. Images were obtained for documentation. Blood was aspirated from the catheter followed by saline and heparin flushes. The appropriate volume and strength of heparin was instilled in each lumen. Caps were applied. The catheter was secured at the tunnel site with Gelfoam and a pursestring suture. The venotomy site was closed with subcuticular Vicryl suture. Dermabond was applied to the small right neck incision. A dry sterile dressing was applied. The catheter is ready for use. No immediate complications.  IMPRESSION: Ultrasound and fluoroscopically guided left internal jugular tunneled hemodialysis catheter (23 cm tip to cuff catheter).   Electronically Signed   By: Jerilynn Mages.  Shick M.D.   On: 09/15/2014 14:09    Scheduled Meds: . aspirin EC  81 mg Oral Daily  . calcitRIOL  1.5 mcg Oral Daily  . calcium carbonate  2 tablet Oral BID WC  . darbepoetin (ARANESP) injection - DIALYSIS  200 mcg Intravenous Q Fri-HD  . heparin  5,000 Units Subcutaneous 3 times per day  . insulin aspart  4 Units Subcutaneous TID WC  . insulin detemir  5 Units Subcutaneous QHS  . mycophenolate  500 mg Oral Daily  . predniSONE  5 mg Oral Q breakfast  . sodium chloride  3 mL Intravenous Q12H  . 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 If 7PM-7AM, please contact night-coverage at [email or web address], password [facility] 09/18/2014, 1:54 PM  LOS: 4 days         Until 11 AM to

## 2014-09-18 NOTE — Transfer of Care (Signed)
Immediate Anesthesia Transfer of Care Note  Patient: Patrick [name]  Procedure(s) Performed: Procedure(s): INSERTION OF ARTERIOVENOUS (AV) GORE-TEX GRAFT ARM USING 4-7MM  X 45CM STRETCH GORE-TEX VASCULAR GRAFT (Right)  Patient Location: PACU  Anesthesia Type:General  Level of Consciousness: awake, alert  and oriented  Airway & Oxygen Therapy: Patient Spontanous Breathing and Patient connected to nasal cannula oxygen  Post-op Assessment: Report given to RN, Post -op Vital signs reviewed and stable and Patient moving all extremities X 4  Post vital signs: Reviewed and stable  Last Vitals:  Filed Vitals:   09/18/14 0913  BP:   Pulse:   Temp: 36.2 C  Resp:     Complications: No apparent anesthesia complications

## 2014-09-18 NOTE — Anesthesia Procedure Notes (Signed)
Procedure Name: Intubation Date/Time: 09/18/2014 7:45 AM Performed by: Gaylene Brooks Pre-anesthesia Checklist: Patient identified, Timeout performed, Emergency Drugs available, Suction available and Patient being monitored Patient Re-evaluated:Patient Re-evaluated prior to inductionOxygen Delivery Method: Circle system utilized Preoxygenation: Pre-oxygenation with 100% oxygen Intubation Type: IV induction Ventilation: Mask ventilation without difficulty Laryngoscope Size: Miller and 2 Grade View: Grade I Tube type: Oral Tube size: 7.5 mm Number of attempts: 1 Airway Equipment and Method: Stylet Placement Confirmation: ETT inserted through vocal cords under direct vision,  breath sounds checked- equal and bilateral,  positive ETCO2 and CO2 detector Secured at: 23 cm Tube secured with: Tape Dental Injury: Teeth and Oropharynx as per pre-operative assessment

## 2014-09-18 NOTE — Op Note (Signed)
    OPERATIVE REPORT  DATE OF SURGERY: 09/18/2014  PATIENT: Patrick [name], 59 y.o. male MRN: [id number]  DOB: [birth year]  PRE-OPERATIVE DIAGNOSIS: End-stage renal disease  POST-OPERATIVE DIAGNOSIS:  Same  PROCEDURE: Right upper arm AV Gore-Tex graft placement  SURGEON:  Curt Jews, M.D.  PHYSICIAN ASSISTANT: Nurse  ANESTHESIA:  Gen.  EBL: Minimal ml     BLOOD ADMINISTERED: None  DRAINS: None  SPECIMEN: None  COUNTS CORRECT:  YES  PLAN OF CARE: PACU   PATIENT DISPOSITION:  PACU - hemodynamically stable  PROCEDURE DETAILS: The patient was taken to the operating placed supine position where the area of the right arm right exiting were prepped and draped in sterile fashion. Patient did not have a palpable radial or ulnar pulse. Did have Doppler flow in the radial 18 ulnar flow. Incision was made over the brachial artery just above the antecubital space and the artery was exposed. There was atherosclerotic changes of the artery. The artery was of large caliber. This was encircled with a blue vessel loop. Next an incision was made over the pulse in the axilla. Subcutaneous tissue was divided with electrocautery down to the neurovascular bundle. The axillary vein was exposed and was of good size. Tributary branches were ligated with 3-0 silk ties and divided. A tunnel was created from the level of the antecubital incision to the axillary incision and the upper arm over the biceps. This was a subcuticular tunnel. A 4 x 7 stretch taper Gore-Tex graft was brought through the tunnel. The brachial artery was occluded proximally and distally was opened with 11 blade incision longitudinally with Potts scissors. The millimeters portion of the graft was slightly tapered and was sewn end-to-side to the artery with a running 6-0 Prolene suture. Clamps removed from the arteries anastomosis was adequate. The graft was flushed with heparinized saline and reoccluded. Next the axillary vein was occluded  proximally and distally was opened with 11 blade similar treatment with Potts scissors. The graft was spatulated and sewn end-to-side to the artery with a running 6-0 Prolene suture. Clamps removed and excellent thrill was noted. The patient continued to have Doppler flow in the radial and ulnar artery with some augmentation on occlusion of the graft. The wounds were irrigated with saline. Hemostasis tablet left cautery. Wounds were closed with 3-0 Vicryl in the subcutaneous and subcuticular tissue. Benzoin Steri-Strips were applied. The patient was transferred to the recovery room in stable condition   Curt Jews, M.D. 09/18/2014 9:04 AM

## 2014-09-19 ENCOUNTER — Encounter (HOSPITAL_COMMUNITY): Payer: Self-pay | Admitting: Vascular Surgery

## 2014-09-19 ENCOUNTER — Encounter (HOSPITAL_COMMUNITY)
Admission: EM | Disposition: A | Discharge status: Home / Self Care | Payer: Self-pay | Source: Home / Self Care | Attending: Internal Medicine

## 2014-09-19 LAB — CBC
HCT: 25.9 % — ABNORMAL LOW (ref 39.0–52.0)
Hemoglobin: 8 g/dL — ABNORMAL LOW (ref 13.0–17.0)
MCH: 27.3 pg (ref 26.0–34.0)
MCHC: 30.9 g/dL (ref 30.0–36.0)
MCV: 88.4 fL (ref 78.0–100.0)
Platelets: 107 10*3/uL — ABNORMAL LOW (ref 150–400)
RBC: 2.93 MIL/uL — ABNORMAL LOW (ref 4.22–5.81)
RDW: 18.3 % — AB (ref 11.5–15.5)
WBC: 5.5 10*3/uL (ref 4.0–10.5)

## 2014-09-19 LAB — COMPREHENSIVE METABOLIC PANEL
ALBUMIN: 3.1 g/dL — AB (ref 3.5–5.2)
ALK PHOS: 63 U/L (ref 39–117)
ALT: <5 U/L (ref 0–53)
ANION GAP: 4 — AB (ref 5–15)
AST: 12 U/L (ref 0–37)
BUN: 11 mg/dL (ref 6–23)
CO2: 29 mmol/L (ref 19–32)
Calcium: 8.1 mg/dL — ABNORMAL LOW (ref 8.4–10.5)
Chloride: 104 mmol/L (ref 96–112)
Creatinine, Ser: 3.17 mg/dL — ABNORMAL HIGH (ref 0.50–1.35)
GFR calc Af Amer: 23 mL/min — ABNORMAL LOW (ref >90)
GFR calc non Af Amer: 20 mL/min — ABNORMAL LOW (ref >90)
Glucose, Bld: 104 mg/dL — ABNORMAL HIGH (ref 70–99)
POTASSIUM: 4.1 mmol/L (ref 3.5–5.1)
SODIUM: 137 mmol/L (ref 135–145)
TOTAL PROTEIN: 5.2 g/dL — AB (ref 6.0–8.3)
Total Bilirubin: 0.6 mg/dL (ref 0.3–1.2)

## 2014-09-19 LAB — GLUCOSE, CAPILLARY: GLUCOSE-CAPILLARY: 95 mg/dL (ref 70–99)

## 2014-09-19 SURGERY — INSERTION OF ARTERIOVENOUS (AV) GORE-TEX GRAFT ARM
Anesthesia: Monitor Anesthesia Care | Site: Arm Upper | Laterality: Right

## 2014-09-19 MED ORDER — OXYCODONE-ACETAMINOPHEN 5-325 MG PO TABS
1.0000 | ORAL_TABLET | ORAL | Status: DC | PRN
Start: 2014-09-19 — End: 2015-09-02

## 2014-09-19 MED ORDER — TACROLIMUS 5 MG PO CAPS: 5.0000 mg | ORAL_CAPSULE | Freq: Two times a day (BID) | ORAL | Status: DC

## 2014-09-19 MED ORDER — MYCOPHENOLATE MOFETIL 250 MG PO CAPS: 500.0000 mg | ORAL_CAPSULE | Freq: Every day | ORAL | Status: DC

## 2014-09-19 NOTE — Progress Notes (Signed)
CARE MANAGEMENT NOTE 09/19/2014  Patient:  NERY, DASTRUP   Account Number:  [id number]  Date Initiated:  09/19/2014  Documentation initiated by:  [facility]  Subjective/Objective Assessment:   Kidney transplant failure     Action/Plan:   Anticipated DC Date:  09/19/2014   Anticipated DC Plan:  [place]  CM consult      Choice offered to / List presented to:             Status of service:  Completed, signed off Medicare Important Message given?  YES (If response is "NO", the following Medicare IM given date fields will be blank) Date Medicare IM given:  09/19/2014 Medicare IM given by:  [facility] Date Additional Medicare IM given:   Additional Medicare IM given by:    Discharge Disposition:  HOME/SELF CARE  Per UR Regulation:    If discussed at Long Length of Stay Meetings, dates discussed:    Comments:  09/19/2014 1045 NCM spoke to pt and no NCM needs identified. Jonnie Finner RN CCM Case Mgmt phone [phone]

## 2014-09-19 NOTE — Discharge Summary (Signed)
Physician Discharge Summary  Kade Demicco MRN: [id number] DOB/AGE: 1956-04-07 59 y.o.  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 The center can begin treatment on Wednesday 02.24.16 at 10:30 AM. Follow hemoglobin A1c results    Medication List    STOP taking these medications        amLODipine 2.5 MG tablet  Commonly known as:  NORVASC      TAKE these medications        aspirin 81 MG tablet  Take 81 mg by mouth daily.     calcitRIOL 0.5 MCG capsule  Commonly known as:  ROCALTROL  Take 3 capsules (1.5 mcg total) by mouth daily.     calcium carbonate 1250 (500 CA) MG tablet  Commonly known as:  OS-CAL - dosed in mg of elemental calcium  Take 2 tablets (1,000 mg of elemental calcium total) by mouth 2 (two) times daily with a meal.     carvedilol 25 MG tablet  Commonly known as:  COREG  Take 25 mg by mouth 2 (two) times daily with a meal.     glucose blood test strip  Commonly known as:  ONE TOUCH ULTRA TEST  Use to test blood glucose 2 times daily as instructed. Dx code: 250.41     Insulin Detemir 100 UNIT/ML Pen  Commonly known as:  LEVEMIR  Inject 5 Units into the skin at bedtime.     insulin lispro 100 UNIT/ML KiwkPen  Commonly known as:  HUMALOG  Inject 4 Units into the skin 3 (three) times daily with meals.     Insulin Pen Needle  31G X 5 MM Misc  Use to inject insulin 4 times per day.     mycophenolate 250 MG capsule  Commonly known as:  CELLCEPT  Take 2 capsules (500 mg total) by mouth daily.     ONETOUCH DELICA LANCETS 03J Misc  Use to test blood sugar 2 times daily as instructed. Dx code: 250.41     oxyCODONE-acetaminophen 5-325 MG per tablet  Commonly known as:  PERCOCET/ROXICET  Take 1-2 tablets by mouth every 4 (four) hours as needed for moderate pain.     potassium chloride SA 20 MEQ tablet  Commonly known as:  K-DUR,KLOR-CON  Take 20 mEq by mouth 2 (two) times daily.     predniSONE 5 MG tablet  Commonly known as:  DELTASONE  Take 5 mg by mouth daily.     sodium bicarbonate 325 MG tablet  Take 325 mg by mouth 2 (two) times daily.     tacrolimus 5 MG capsule  Commonly known as:  PROGRAF  Take 1 capsule (5 mg total) by mouth 2 (two) times daily.        Discharge Condition:  Disposition: 01-Home or Self Care   Consults: Nephrology, vascular surgery  Significant Diagnostic Studies: Dg Chest 2 View  09/14/2014   CLINICAL DATA:  Pt sen here by  Dr Erling Cruz for abnormal labs, sob and weight loss. Pt had kidney transplant in 2009 and per Dr Florene Glen he believes the kidney is failing. [facility] states they faxed over all labs. Pt has hx of HTN, diabetes. Non-Smoker.  EXAM: CHEST  2 VIEW  COMPARISON:  None.  FINDINGS: There are changes from a previous median sternotomy. Cardiac silhouette is mildly enlarged. No mediastinal or hilar masses. No convincing adenopathy.  Lungs are clear.  No pleural effusion or pneumothorax.  Bony thorax is unremarkable.  IMPRESSION: No acute cardiopulmonary disease.   Electronically Signed   By: Lajean Manes M.D.   On: 09/14/2014 18:06   Ir Fluoro Guide Cv Line Left  09/15/2014   CLINICAL DATA:  End-stage renal disease, no current access.  EXAM: ULTRASOUND GUIDANCE FOR VASCULAR ACCESS  LEFT INTERNAL JUGULAR PERMANENT HEMODIALYSIS CATHETER  Date:  2/19/20162/19/2016  1:09 pm  Radiologist:  M. Daryll Brod, MD  Guidance:  Ultrasound and fluoroscopic  FLUOROSCOPY TIME:  42 seconds  MEDICATIONS AND MEDICAL HISTORY: 2 g Ancef administered within 1 hour of the procedure, 1 mg Versed, 50 mcg fentanyl  ANESTHESIA/SEDATION: 20 minutes  CONTRAST:  None  COMPLICATIONS: None immediate  PROCEDURE: Informed consent was obtained from the patient following explanation of the procedure, risks, benefits and alternatives. The patient understands, agrees and consents for the procedure. All questions were addressed. A time out was performed.  Maximal barrier sterile technique utilized including caps, mask, sterile gowns, sterile gloves, large sterile drape, hand hygiene, and 2% chlorhexidine scrub.  Under sterile conditions and local anesthesia, left internal jugular micropuncture venous access was performed with ultrasound. Images were obtained for documentation. A guide wire was inserted followed by a transitional dilator. Next, a 0.035 guidewire was advanced into the IVC with a 5-French catheter. Measurements were obtained from the left venotomy site to the proximal right atrium. In the left infraclavicular chest, a subcutaneous tunnel was created under sterile conditions and local anesthesia. 1% lidocaine with epinephrine was utilized for this. The 23 cm tip to cuff catheter was tunneled subcutaneously to the venotomy site and inserted into the SVC/RA junction through a valved peel-away sheath. Position was confirmed with fluoroscopy. Images were obtained for documentation. Blood was aspirated from the catheter followed by saline and heparin flushes. The appropriate volume and strength of heparin was instilled in each lumen. Caps were applied. The catheter was secured at the tunnel site with Gelfoam and a pursestring suture. The venotomy site was closed with subcuticular Vicryl suture. Dermabond was applied to the small right neck incision. A dry sterile dressing was applied. The catheter is  ready for use. No immediate complications.  IMPRESSION: Ultrasound and fluoroscopically guided left internal jugular tunneled hemodialysis catheter (23 cm tip to cuff catheter).   Electronically Signed   By: Jerilynn Mages.  Shick M.D.   On: 09/15/2014 14:09   Ir US Guide Vasc Access Left  09/15/2014   CLINICAL DATA:  End-stage renal disease, no current access.  EXAM: ULTRASOUND GUIDANCE FOR VASCULAR ACCESS  LEFT INTERNAL JUGULAR PERMANENT HEMODIALYSIS CATHETER  Date:  2/19/20162/19/2016 1:09 pm  Radiologist:  M. Daryll Brod, MD  Guidance:  Ultrasound and fluoroscopic  FLUOROSCOPY TIME:  42 seconds  MEDICATIONS AND MEDICAL HISTORY: 2 g Ancef administered within 1 hour of the procedure, 1 mg Versed, 50 mcg fentanyl  ANESTHESIA/SEDATION: 20 minutes  CONTRAST:  None  COMPLICATIONS: None immediate  PROCEDURE: Informed consent was obtained from the patient following explanation of the procedure, risks, benefits and alternatives.  The patient understands, agrees and consents for the procedure. All questions were addressed. A time out was performed.  Maximal barrier sterile technique utilized including caps, mask, sterile gowns, sterile gloves, large sterile drape, hand hygiene, and 2% chlorhexidine scrub.  Under sterile conditions and local anesthesia, left internal jugular micropuncture venous access was performed with ultrasound. Images were obtained for documentation. A guide wire was inserted followed by a transitional dilator. Next, a 0.035 guidewire was advanced into the IVC with a 5-French catheter. Measurements were obtained from the left venotomy site to the proximal right atrium. In the left infraclavicular chest, a subcutaneous tunnel was created under sterile conditions and local anesthesia. 1% lidocaine with epinephrine was utilized for this. The 23 cm tip to cuff catheter was tunneled subcutaneously to the venotomy site and inserted into the SVC/RA junction through a valved peel-away sheath. Position was confirmed  with fluoroscopy. Images were obtained for documentation. Blood was aspirated from the catheter followed by saline and heparin flushes. The appropriate volume and strength of heparin was instilled in each lumen. Caps were applied. The catheter was secured at the tunnel site with Gelfoam and a pursestring suture. The venotomy site was closed with subcuticular Vicryl suture. Dermabond was applied to the small right neck incision. A dry sterile dressing was applied. The catheter is ready for use. No immediate complications.  IMPRESSION: Ultrasound and fluoroscopically guided left internal jugular tunneled hemodialysis catheter (23 cm tip to cuff catheter).   Electronically Signed   By: Jerilynn Mages.  Shick M.D.   On: 09/15/2014 14:09      Microbiology: Recent Results (from the past 240 hour(s))  Surgical pcr screen     Status: None   Collection Time: 09/17/14  8:40 PM  Result Value Ref Range Status   MRSA, PCR NEGATIVE NEGATIVE Final   Staphylococcus aureus NEGATIVE NEGATIVE Final    Comment:        The Xpert SA Assay (FDA approved for NASAL specimens in patients over 30 years of age), is one component of a comprehensive surveillance program.  Test performance has been validated by [facility] for patients greater than or equal to 104 year old. It is not intended to diagnose infection nor to guide or monitor treatment.      Labs: Results for orders placed or performed during the hospital encounter of 09/14/14 (from the past 48 hour(s))  Glucose, capillary     Status: Abnormal   Collection Time: 09/17/14  4:40 PM  Result Value Ref Range   Glucose-Capillary 111 (H) 70 - 99 mg/dL  Surgical pcr screen     Status: None   Collection Time: 09/17/14  8:40 PM  Result Value Ref Range   MRSA, PCR NEGATIVE NEGATIVE   Staphylococcus aureus NEGATIVE NEGATIVE    Comment:        The Xpert SA Assay (FDA approved for NASAL specimens in patients over 24 years of age), is one component of a comprehensive  surveillance program.  Test performance has been validated by [facility] for patients greater than or equal to 83 year old. It is not intended to diagnose infection nor to guide or monitor treatment.   Glucose, capillary     Status: Abnormal   Collection Time: 09/17/14  9:20 PM  Result Value Ref Range   Glucose-Capillary 138 (H) 70 - 99 mg/dL  CBC     Status: Abnormal   Collection Time: 09/18/14  5:00 AM  Result Value Ref Range   WBC 6.2 4.0 -  10.5 K/uL   RBC 3.13 (L) 4.22 - 5.81 MIL/uL   Hemoglobin 8.4 (L) 13.0 - 17.0 g/dL   HCT 26.2 (L) 39.0 - 52.0 %   MCV 83.7 78.0 - 100.0 fL   MCH 26.8 26.0 - 34.0 pg   MCHC 32.1 30.0 - 36.0 g/dL   RDW 18.0 (H) 11.5 - 15.5 %   Platelets 111 (L) 150 - 400 K/uL    Comment: PLATELET COUNT CONFIRMED BY SMEAR SPECIMEN CHECKED FOR CLOTS REPEATED TO VERIFY   Comprehensive metabolic panel     Status: Abnormal   Collection Time: 09/18/14  5:00 AM  Result Value Ref Range   Sodium 139 135 - 145 mmol/L   Potassium 2.8 (L) 3.5 - 5.1 mmol/L   Chloride 108 96 - 112 mmol/L   CO2 24 19 - 32 mmol/L   Glucose, Bld 137 (H) 70 - 99 mg/dL   BUN 19 6 - 23 mg/dL   Creatinine, Ser 3.67 (H) 0.50 - 1.35 mg/dL   Calcium 7.7 (L) 8.4 - 10.5 mg/dL   Total Protein 5.7 (L) 6.0 - 8.3 g/dL   Albumin 3.3 (L) 3.5 - 5.2 g/dL   AST 12 0 - 37 U/L   ALT 5 0 - 53 U/L   Alkaline Phosphatase 67 39 - 117 U/L   Total Bilirubin 0.4 0.3 - 1.2 mg/dL   GFR calc non Af Amer 17 (L) >90 mL/min   GFR calc Af Amer 19 (L) >90 mL/min    Comment: (NOTE) The eGFR has been calculated using the CKD EPI equation. This calculation has not been validated in all clinical situations. eGFR's persistently <90 mL/min signify possible Chronic Kidney Disease.    Anion gap 7 5 - 15  Glucose, capillary     Status: None   Collection Time: 09/18/14  7:06 AM  Result Value Ref Range   Glucose-Capillary 99 70 - 99 mg/dL  Glucose, capillary     Status: Abnormal   Collection Time: 09/18/14  9:23 AM   Result Value Ref Range   Glucose-Capillary 117 (H) 70 - 99 mg/dL  Glucose, capillary     Status: Abnormal   Collection Time: 09/18/14  3:44 PM  Result Value Ref Range   Glucose-Capillary 112 (H) 70 - 99 mg/dL  Glucose, capillary     Status: Abnormal   Collection Time: 09/18/14  9:48 PM  Result Value Ref Range   Glucose-Capillary 113 (H) 70 - 99 mg/dL  CBC     Status: Abnormal   Collection Time: 09/19/14  5:26 AM  Result Value Ref Range   WBC 5.5 4.0 - 10.5 K/uL   RBC 2.93 (L) 4.22 - 5.81 MIL/uL   Hemoglobin 8.0 (L) 13.0 - 17.0 g/dL   HCT 25.9 (L) 39.0 - 52.0 %   MCV 88.4 78.0 - 100.0 fL   MCH 27.3 26.0 - 34.0 pg   MCHC 30.9 30.0 - 36.0 g/dL   RDW 18.3 (H) 11.5 - 15.5 %   Platelets 107 (L) 150 - 400 K/uL    Comment: REPEATED TO VERIFY CONSISTENT WITH PREVIOUS RESULT   Comprehensive metabolic panel     Status: Abnormal   Collection Time: 09/19/14  5:26 AM  Result Value Ref Range   Sodium 137 135 - 145 mmol/L   Potassium 4.1 3.5 - 5.1 mmol/L    Comment: NO VISIBLE HEMOLYSIS   Chloride 104 96 - 112 mmol/L   CO2 29 19 - 32 mmol/L   Glucose, Bld 104 (H)  70 - 99 mg/dL   BUN 11 6 - 23 mg/dL   Creatinine, Ser 3.17 (H) 0.50 - 1.35 mg/dL   Calcium 8.1 (L) 8.4 - 10.5 mg/dL   Total Protein 5.2 (L) 6.0 - 8.3 g/dL   Albumin 3.1 (L) 3.5 - 5.2 g/dL   AST 12 0 - 37 U/L   ALT <5 0 - 53 U/L   Alkaline Phosphatase 63 39 - 117 U/L   Total Bilirubin 0.6 0.3 - 1.2 mg/dL   GFR calc non Af Amer 20 (L) >90 mL/min   GFR calc Af Amer 23 (L) >90 mL/min    Comment: (NOTE) The eGFR has been calculated using the CKD EPI equation. This calculation has not been validated in all clinical situations. eGFR's persistently <90 mL/min signify possible Chronic Kidney Disease.    Anion gap 4 (L) 5 - 15  Glucose, capillary     Status: None   Collection Time: 09/19/14  7:42 AM  Result Value Ref Range   Glucose-Capillary 95 70 - 99 mg/dL   Comment 1 Documented in Char      Patrick [name] 59 y.o. male with a  history of ESRD s/p DDKT 2008 at [facility]. He had a bout of acute vascular rejection in April 2015 with a creainine peakin at About 40m/dl and improving to 3.45 on 11/29/13. In April 2015 he was hospitalized with a hemoglobin of 6.5gram treated with PRBCs. No GI work up done. On Jun 20 2014 creat was 3.324mdl and prograf level was not detectable. He did not go for the repeat prograf level. He c/o malaise, decreased appetitie, dizziness upon standing. He has a bad taste in his mouth. He has lost almost 30 pounds since November. He reports dyspnea with exertion, SOB at rest and wife reports he is hyperventilating. He was sent to the emergency room due to labs revealing a bicarbonate level of 8, creat of 4.85 and hemoglobin of 7.5gms. He had an access that clotted a few months ago and Dr. DiScot Dockanted to hold off insertion until closer to needing dialysis.  HOSPITAL COURSE:   Chronic kidney disease stage V, s/p rejection episode in 2015 left with advanced CKD- now with symptomatic uremia requiring initiation of dialysis PC placed by IR 2/19. Seen by Dr. DiScot Dockvascular surgery for right upper arm graft   on 2/22 Seen by Dr. ToRosetta PosnerMD prior to discharge,Should be able to use his right upper arm graft in 3-4 weeks.  [facility] Status post 3 sessions of hemodialysis-First HD 2/19, second 2/20, third 2/22 Nephrology to make decisions about mycophenolate, Prograf, prednisone;on prograf 5 BID, cellcept 500 BID and prednisone 5- have taken cellcept down to daily. Next wean in about a month.    Hypertension, borderline low, will he need MIDODRINE?  Continue to hold Norvasc, Coreg    Diabetes stable, continue NovoLog and Levemir, stable Hemoglobin A1c pending   Anemia Status post 2 units of packed red blood cells 2/19 6.8> 8.4 Stable follow-up outpatient with hemodialysis   Hypokalemia, potassium 2.8 Will replete, nephrology to  adjust during dialysis     Discharge Exam: *   Blood pressure 106/76, pulse 78, temperature 98.2 F (36.8 C), temperature source Oral, resp. rate 18, height 5' 6.5" (1.689 m), weight 82.6 kg (182 lb 1.6 oz), SpO2 100 %.         Discharge Instructions    Diet - low sodium heart healthy  Complete by:  As directed      Increase activity slowly    Complete by:  As directed              Signed: Jasmine Mcbeth 09/19/2014, 12:06 PM

## 2014-09-19 NOTE — Progress Notes (Signed)
Patient ID: Patrick [name], male   DOB: [birth year], 59 y.o.   MRN: [id number] Comfortable this morning. Reports some soreness in his graft site. Dressing intact and his antecubital and axillary wound. Excellent thrill in his graft. The patient denies any pain numbness or aching in his right hand. He did not have regular ulnar pulse preop. His hand is well-perfused with no evidence of ischemia changes or steal. I did discuss to with the patient a note to notify us should he develop any hand issues. Should be able to use his right upper arm graft in 3-4 weeks. Will not follow actively. Please call if we can assist.

## 2014-09-20 DIAGNOSIS — N186 End stage renal disease: Secondary | ICD-10-CM | POA: Diagnosis not present

## 2014-09-20 DIAGNOSIS — E1029 Type 1 diabetes mellitus with other diabetic kidney complication: Secondary | ICD-10-CM | POA: Diagnosis not present

## 2014-09-20 DIAGNOSIS — D631 Anemia in chronic kidney disease: Secondary | ICD-10-CM | POA: Diagnosis not present

## 2014-09-20 LAB — HEMOGLOBIN A1C
Hgb A1c MFr Bld: 6.2 % — ABNORMAL HIGH (ref 4.8–5.6)
Mean Plasma Glucose: 131 mg/dL

## 2014-09-21 ENCOUNTER — Inpatient Hospital Stay (HOSPITAL_COMMUNITY): Admission: RE | Admit: 2014-09-21 | Payer: Medicare Other | Source: Ambulatory Visit

## 2014-09-22 DIAGNOSIS — D631 Anemia in chronic kidney disease: Secondary | ICD-10-CM | POA: Diagnosis not present

## 2014-09-22 DIAGNOSIS — E1029 Type 1 diabetes mellitus with other diabetic kidney complication: Secondary | ICD-10-CM | POA: Diagnosis not present

## 2014-09-22 DIAGNOSIS — N186 End stage renal disease: Secondary | ICD-10-CM | POA: Diagnosis not present

## 2014-09-25 DIAGNOSIS — E1029 Type 1 diabetes mellitus with other diabetic kidney complication: Secondary | ICD-10-CM | POA: Diagnosis not present

## 2014-09-25 DIAGNOSIS — Z992 Dependence on renal dialysis: Secondary | ICD-10-CM | POA: Diagnosis not present

## 2014-09-25 DIAGNOSIS — N186 End stage renal disease: Secondary | ICD-10-CM | POA: Diagnosis not present

## 2014-09-25 DIAGNOSIS — D631 Anemia in chronic kidney disease: Secondary | ICD-10-CM | POA: Diagnosis not present

## 2014-09-27 DIAGNOSIS — N2581 Secondary hyperparathyroidism of renal origin: Secondary | ICD-10-CM | POA: Diagnosis not present

## 2014-09-27 DIAGNOSIS — N186 End stage renal disease: Secondary | ICD-10-CM | POA: Diagnosis not present

## 2014-09-27 DIAGNOSIS — E1029 Type 1 diabetes mellitus with other diabetic kidney complication: Secondary | ICD-10-CM | POA: Diagnosis not present

## 2014-09-27 DIAGNOSIS — D631 Anemia in chronic kidney disease: Secondary | ICD-10-CM | POA: Diagnosis not present

## 2014-09-29 DIAGNOSIS — N186 End stage renal disease: Secondary | ICD-10-CM | POA: Diagnosis not present

## 2014-09-29 DIAGNOSIS — N2581 Secondary hyperparathyroidism of renal origin: Secondary | ICD-10-CM | POA: Diagnosis not present

## 2014-09-29 DIAGNOSIS — D631 Anemia in chronic kidney disease: Secondary | ICD-10-CM | POA: Diagnosis not present

## 2014-09-29 DIAGNOSIS — E1029 Type 1 diabetes mellitus with other diabetic kidney complication: Secondary | ICD-10-CM | POA: Diagnosis not present

## 2014-10-02 DIAGNOSIS — E1029 Type 1 diabetes mellitus with other diabetic kidney complication: Secondary | ICD-10-CM | POA: Diagnosis not present

## 2014-10-02 DIAGNOSIS — N2581 Secondary hyperparathyroidism of renal origin: Secondary | ICD-10-CM | POA: Diagnosis not present

## 2014-10-02 DIAGNOSIS — N186 End stage renal disease: Secondary | ICD-10-CM | POA: Diagnosis not present

## 2014-10-02 DIAGNOSIS — D631 Anemia in chronic kidney disease: Secondary | ICD-10-CM | POA: Diagnosis not present

## 2014-10-04 DIAGNOSIS — N186 End stage renal disease: Secondary | ICD-10-CM | POA: Diagnosis not present

## 2014-10-04 DIAGNOSIS — N2581 Secondary hyperparathyroidism of renal origin: Secondary | ICD-10-CM | POA: Diagnosis not present

## 2014-10-04 DIAGNOSIS — E1029 Type 1 diabetes mellitus with other diabetic kidney complication: Secondary | ICD-10-CM | POA: Diagnosis not present

## 2014-10-04 DIAGNOSIS — D631 Anemia in chronic kidney disease: Secondary | ICD-10-CM | POA: Diagnosis not present

## 2014-10-06 DIAGNOSIS — D631 Anemia in chronic kidney disease: Secondary | ICD-10-CM | POA: Diagnosis not present

## 2014-10-06 DIAGNOSIS — N186 End stage renal disease: Secondary | ICD-10-CM | POA: Diagnosis not present

## 2014-10-06 DIAGNOSIS — N2581 Secondary hyperparathyroidism of renal origin: Secondary | ICD-10-CM | POA: Diagnosis not present

## 2014-10-06 DIAGNOSIS — E1029 Type 1 diabetes mellitus with other diabetic kidney complication: Secondary | ICD-10-CM | POA: Diagnosis not present

## 2014-10-09 DIAGNOSIS — E1029 Type 1 diabetes mellitus with other diabetic kidney complication: Secondary | ICD-10-CM | POA: Diagnosis not present

## 2014-10-09 DIAGNOSIS — N2581 Secondary hyperparathyroidism of renal origin: Secondary | ICD-10-CM | POA: Diagnosis not present

## 2014-10-09 DIAGNOSIS — D631 Anemia in chronic kidney disease: Secondary | ICD-10-CM | POA: Diagnosis not present

## 2014-10-09 DIAGNOSIS — N186 End stage renal disease: Secondary | ICD-10-CM | POA: Diagnosis not present

## 2014-10-11 DIAGNOSIS — D631 Anemia in chronic kidney disease: Secondary | ICD-10-CM | POA: Diagnosis not present

## 2014-10-11 DIAGNOSIS — N186 End stage renal disease: Secondary | ICD-10-CM | POA: Diagnosis not present

## 2014-10-11 DIAGNOSIS — E1029 Type 1 diabetes mellitus with other diabetic kidney complication: Secondary | ICD-10-CM | POA: Diagnosis not present

## 2014-10-11 DIAGNOSIS — N2581 Secondary hyperparathyroidism of renal origin: Secondary | ICD-10-CM | POA: Diagnosis not present

## 2014-10-14 DIAGNOSIS — E1029 Type 1 diabetes mellitus with other diabetic kidney complication: Secondary | ICD-10-CM | POA: Diagnosis not present

## 2014-10-14 DIAGNOSIS — N2581 Secondary hyperparathyroidism of renal origin: Secondary | ICD-10-CM | POA: Diagnosis not present

## 2014-10-14 DIAGNOSIS — D631 Anemia in chronic kidney disease: Secondary | ICD-10-CM | POA: Diagnosis not present

## 2014-10-14 DIAGNOSIS — N186 End stage renal disease: Secondary | ICD-10-CM | POA: Diagnosis not present

## 2014-10-17 DIAGNOSIS — N186 End stage renal disease: Secondary | ICD-10-CM | POA: Diagnosis not present

## 2014-10-17 DIAGNOSIS — D631 Anemia in chronic kidney disease: Secondary | ICD-10-CM | POA: Diagnosis not present

## 2014-10-17 DIAGNOSIS — N2581 Secondary hyperparathyroidism of renal origin: Secondary | ICD-10-CM | POA: Diagnosis not present

## 2014-10-17 DIAGNOSIS — E1029 Type 1 diabetes mellitus with other diabetic kidney complication: Secondary | ICD-10-CM | POA: Diagnosis not present

## 2014-10-18 DIAGNOSIS — N2581 Secondary hyperparathyroidism of renal origin: Secondary | ICD-10-CM | POA: Diagnosis not present

## 2014-10-18 DIAGNOSIS — E1029 Type 1 diabetes mellitus with other diabetic kidney complication: Secondary | ICD-10-CM | POA: Diagnosis not present

## 2014-10-18 DIAGNOSIS — D631 Anemia in chronic kidney disease: Secondary | ICD-10-CM | POA: Diagnosis not present

## 2014-10-18 DIAGNOSIS — N186 End stage renal disease: Secondary | ICD-10-CM | POA: Diagnosis not present

## 2014-10-21 DIAGNOSIS — N2581 Secondary hyperparathyroidism of renal origin: Secondary | ICD-10-CM | POA: Diagnosis not present

## 2014-10-21 DIAGNOSIS — N186 End stage renal disease: Secondary | ICD-10-CM | POA: Diagnosis not present

## 2014-10-21 DIAGNOSIS — D631 Anemia in chronic kidney disease: Secondary | ICD-10-CM | POA: Diagnosis not present

## 2014-10-21 DIAGNOSIS — E1029 Type 1 diabetes mellitus with other diabetic kidney complication: Secondary | ICD-10-CM | POA: Diagnosis not present

## 2014-10-24 DIAGNOSIS — E1029 Type 1 diabetes mellitus with other diabetic kidney complication: Secondary | ICD-10-CM | POA: Diagnosis not present

## 2014-10-24 DIAGNOSIS — D631 Anemia in chronic kidney disease: Secondary | ICD-10-CM | POA: Diagnosis not present

## 2014-10-24 DIAGNOSIS — N186 End stage renal disease: Secondary | ICD-10-CM | POA: Diagnosis not present

## 2014-10-24 DIAGNOSIS — N2581 Secondary hyperparathyroidism of renal origin: Secondary | ICD-10-CM | POA: Diagnosis not present

## 2014-10-26 DIAGNOSIS — N186 End stage renal disease: Secondary | ICD-10-CM | POA: Diagnosis not present

## 2014-10-26 DIAGNOSIS — E1029 Type 1 diabetes mellitus with other diabetic kidney complication: Secondary | ICD-10-CM | POA: Diagnosis not present

## 2014-10-26 DIAGNOSIS — D631 Anemia in chronic kidney disease: Secondary | ICD-10-CM | POA: Diagnosis not present

## 2014-10-26 DIAGNOSIS — T8610 Unspecified complication of kidney transplant: Secondary | ICD-10-CM | POA: Diagnosis not present

## 2014-10-26 DIAGNOSIS — Z992 Dependence on renal dialysis: Secondary | ICD-10-CM | POA: Diagnosis not present

## 2014-10-26 DIAGNOSIS — N2581 Secondary hyperparathyroidism of renal origin: Secondary | ICD-10-CM | POA: Diagnosis not present

## 2014-10-28 DIAGNOSIS — D631 Anemia in chronic kidney disease: Secondary | ICD-10-CM | POA: Diagnosis not present

## 2014-10-28 DIAGNOSIS — D509 Iron deficiency anemia, unspecified: Secondary | ICD-10-CM | POA: Diagnosis not present

## 2014-10-28 DIAGNOSIS — Z23 Encounter for immunization: Secondary | ICD-10-CM | POA: Diagnosis not present

## 2014-10-28 DIAGNOSIS — N186 End stage renal disease: Secondary | ICD-10-CM | POA: Diagnosis not present

## 2014-10-28 DIAGNOSIS — E1029 Type 1 diabetes mellitus with other diabetic kidney complication: Secondary | ICD-10-CM | POA: Diagnosis not present

## 2014-10-31 DIAGNOSIS — Z23 Encounter for immunization: Secondary | ICD-10-CM | POA: Diagnosis not present

## 2014-10-31 DIAGNOSIS — D631 Anemia in chronic kidney disease: Secondary | ICD-10-CM | POA: Diagnosis not present

## 2014-10-31 DIAGNOSIS — N186 End stage renal disease: Secondary | ICD-10-CM | POA: Diagnosis not present

## 2014-10-31 DIAGNOSIS — E1029 Type 1 diabetes mellitus with other diabetic kidney complication: Secondary | ICD-10-CM | POA: Diagnosis not present

## 2014-10-31 DIAGNOSIS — D509 Iron deficiency anemia, unspecified: Secondary | ICD-10-CM | POA: Diagnosis not present

## 2014-11-02 DIAGNOSIS — D631 Anemia in chronic kidney disease: Secondary | ICD-10-CM | POA: Diagnosis not present

## 2014-11-02 DIAGNOSIS — Z23 Encounter for immunization: Secondary | ICD-10-CM | POA: Diagnosis not present

## 2014-11-02 DIAGNOSIS — N186 End stage renal disease: Secondary | ICD-10-CM | POA: Diagnosis not present

## 2014-11-02 DIAGNOSIS — D509 Iron deficiency anemia, unspecified: Secondary | ICD-10-CM | POA: Diagnosis not present

## 2014-11-02 DIAGNOSIS — E1029 Type 1 diabetes mellitus with other diabetic kidney complication: Secondary | ICD-10-CM | POA: Diagnosis not present

## 2014-11-04 DIAGNOSIS — E1029 Type 1 diabetes mellitus with other diabetic kidney complication: Secondary | ICD-10-CM | POA: Diagnosis not present

## 2014-11-04 DIAGNOSIS — D509 Iron deficiency anemia, unspecified: Secondary | ICD-10-CM | POA: Diagnosis not present

## 2014-11-04 DIAGNOSIS — N186 End stage renal disease: Secondary | ICD-10-CM | POA: Diagnosis not present

## 2014-11-04 DIAGNOSIS — D631 Anemia in chronic kidney disease: Secondary | ICD-10-CM | POA: Diagnosis not present

## 2014-11-04 DIAGNOSIS — Z23 Encounter for immunization: Secondary | ICD-10-CM | POA: Diagnosis not present

## 2014-11-06 DIAGNOSIS — Z452 Encounter for adjustment and management of vascular access device: Secondary | ICD-10-CM | POA: Diagnosis not present

## 2014-11-07 DIAGNOSIS — Z23 Encounter for immunization: Secondary | ICD-10-CM | POA: Diagnosis not present

## 2014-11-07 DIAGNOSIS — N186 End stage renal disease: Secondary | ICD-10-CM | POA: Diagnosis not present

## 2014-11-07 DIAGNOSIS — E1029 Type 1 diabetes mellitus with other diabetic kidney complication: Secondary | ICD-10-CM | POA: Diagnosis not present

## 2014-11-07 DIAGNOSIS — D631 Anemia in chronic kidney disease: Secondary | ICD-10-CM | POA: Diagnosis not present

## 2014-11-07 DIAGNOSIS — D509 Iron deficiency anemia, unspecified: Secondary | ICD-10-CM | POA: Diagnosis not present

## 2014-11-09 ENCOUNTER — Telehealth: Payer: Self-pay | Admitting: Endocrinology

## 2014-11-09 DIAGNOSIS — N186 End stage renal disease: Secondary | ICD-10-CM | POA: Diagnosis not present

## 2014-11-09 DIAGNOSIS — Z23 Encounter for immunization: Secondary | ICD-10-CM | POA: Diagnosis not present

## 2014-11-09 DIAGNOSIS — E1029 Type 1 diabetes mellitus with other diabetic kidney complication: Secondary | ICD-10-CM | POA: Diagnosis not present

## 2014-11-09 DIAGNOSIS — D509 Iron deficiency anemia, unspecified: Secondary | ICD-10-CM | POA: Diagnosis not present

## 2014-11-09 DIAGNOSIS — D631 Anemia in chronic kidney disease: Secondary | ICD-10-CM | POA: Diagnosis not present

## 2014-11-09 MED ORDER — GLUCOSE BLOOD VI STRP: ORAL_STRIP | Status: DC

## 2014-11-09 NOTE — Telephone Encounter (Signed)
Rx sent per pt's request.  

## 2014-11-09 NOTE — Telephone Encounter (Signed)
Pt needs test strips for his meter one touch ultra 2 called into [facility] on [address]

## 2014-11-11 DIAGNOSIS — D631 Anemia in chronic kidney disease: Secondary | ICD-10-CM | POA: Diagnosis not present

## 2014-11-11 DIAGNOSIS — D509 Iron deficiency anemia, unspecified: Secondary | ICD-10-CM | POA: Diagnosis not present

## 2014-11-11 DIAGNOSIS — E1029 Type 1 diabetes mellitus with other diabetic kidney complication: Secondary | ICD-10-CM | POA: Diagnosis not present

## 2014-11-11 DIAGNOSIS — N186 End stage renal disease: Secondary | ICD-10-CM | POA: Diagnosis not present

## 2014-11-11 DIAGNOSIS — Z23 Encounter for immunization: Secondary | ICD-10-CM | POA: Diagnosis not present

## 2014-11-14 DIAGNOSIS — E1029 Type 1 diabetes mellitus with other diabetic kidney complication: Secondary | ICD-10-CM | POA: Diagnosis not present

## 2014-11-14 DIAGNOSIS — N186 End stage renal disease: Secondary | ICD-10-CM | POA: Diagnosis not present

## 2014-11-14 DIAGNOSIS — Z23 Encounter for immunization: Secondary | ICD-10-CM | POA: Diagnosis not present

## 2014-11-14 DIAGNOSIS — D631 Anemia in chronic kidney disease: Secondary | ICD-10-CM | POA: Diagnosis not present

## 2014-11-14 DIAGNOSIS — D509 Iron deficiency anemia, unspecified: Secondary | ICD-10-CM | POA: Diagnosis not present

## 2014-11-16 DIAGNOSIS — N186 End stage renal disease: Secondary | ICD-10-CM | POA: Diagnosis not present

## 2014-11-16 DIAGNOSIS — D509 Iron deficiency anemia, unspecified: Secondary | ICD-10-CM | POA: Diagnosis not present

## 2014-11-16 DIAGNOSIS — Z23 Encounter for immunization: Secondary | ICD-10-CM | POA: Diagnosis not present

## 2014-11-16 DIAGNOSIS — E1029 Type 1 diabetes mellitus with other diabetic kidney complication: Secondary | ICD-10-CM | POA: Diagnosis not present

## 2014-11-16 DIAGNOSIS — D631 Anemia in chronic kidney disease: Secondary | ICD-10-CM | POA: Diagnosis not present

## 2014-11-18 DIAGNOSIS — E1029 Type 1 diabetes mellitus with other diabetic kidney complication: Secondary | ICD-10-CM | POA: Diagnosis not present

## 2014-11-18 DIAGNOSIS — D631 Anemia in chronic kidney disease: Secondary | ICD-10-CM | POA: Diagnosis not present

## 2014-11-18 DIAGNOSIS — N186 End stage renal disease: Secondary | ICD-10-CM | POA: Diagnosis not present

## 2014-11-18 DIAGNOSIS — D509 Iron deficiency anemia, unspecified: Secondary | ICD-10-CM | POA: Diagnosis not present

## 2014-11-18 DIAGNOSIS — Z23 Encounter for immunization: Secondary | ICD-10-CM | POA: Diagnosis not present

## 2014-11-20 DIAGNOSIS — E119 Type 2 diabetes mellitus without complications: Secondary | ICD-10-CM | POA: Diagnosis not present

## 2014-11-21 DIAGNOSIS — N186 End stage renal disease: Secondary | ICD-10-CM | POA: Diagnosis not present

## 2014-11-21 DIAGNOSIS — Z23 Encounter for immunization: Secondary | ICD-10-CM | POA: Diagnosis not present

## 2014-11-21 DIAGNOSIS — D631 Anemia in chronic kidney disease: Secondary | ICD-10-CM | POA: Diagnosis not present

## 2014-11-21 DIAGNOSIS — D509 Iron deficiency anemia, unspecified: Secondary | ICD-10-CM | POA: Diagnosis not present

## 2014-11-21 DIAGNOSIS — E1029 Type 1 diabetes mellitus with other diabetic kidney complication: Secondary | ICD-10-CM | POA: Diagnosis not present

## 2014-11-23 DIAGNOSIS — D631 Anemia in chronic kidney disease: Secondary | ICD-10-CM | POA: Diagnosis not present

## 2014-11-23 DIAGNOSIS — D509 Iron deficiency anemia, unspecified: Secondary | ICD-10-CM | POA: Diagnosis not present

## 2014-11-23 DIAGNOSIS — E1029 Type 1 diabetes mellitus with other diabetic kidney complication: Secondary | ICD-10-CM | POA: Diagnosis not present

## 2014-11-23 DIAGNOSIS — Z23 Encounter for immunization: Secondary | ICD-10-CM | POA: Diagnosis not present

## 2014-11-23 DIAGNOSIS — N186 End stage renal disease: Secondary | ICD-10-CM | POA: Diagnosis not present

## 2014-11-25 DIAGNOSIS — Z992 Dependence on renal dialysis: Secondary | ICD-10-CM | POA: Diagnosis not present

## 2014-11-25 DIAGNOSIS — D631 Anemia in chronic kidney disease: Secondary | ICD-10-CM | POA: Diagnosis not present

## 2014-11-25 DIAGNOSIS — D509 Iron deficiency anemia, unspecified: Secondary | ICD-10-CM | POA: Diagnosis not present

## 2014-11-25 DIAGNOSIS — T8610 Unspecified complication of kidney transplant: Secondary | ICD-10-CM | POA: Diagnosis not present

## 2014-11-25 DIAGNOSIS — E1029 Type 1 diabetes mellitus with other diabetic kidney complication: Secondary | ICD-10-CM | POA: Diagnosis not present

## 2014-11-25 DIAGNOSIS — N186 End stage renal disease: Secondary | ICD-10-CM | POA: Diagnosis not present

## 2014-11-25 DIAGNOSIS — Z23 Encounter for immunization: Secondary | ICD-10-CM | POA: Diagnosis not present

## 2014-11-27 ENCOUNTER — Encounter: Payer: Self-pay | Admitting: Endocrinology

## 2014-11-27 ENCOUNTER — Ambulatory Visit (INDEPENDENT_AMBULATORY_CARE_PROVIDER_SITE_OTHER): Payer: Medicare Other | Admitting: Endocrinology

## 2014-11-27 VITALS — BP 118/70 | HR 73 | Temp 98.1°F | Ht 66.5 in | Wt 202.0 lb

## 2014-11-27 DIAGNOSIS — E1022 Type 1 diabetes mellitus with diabetic chronic kidney disease: Secondary | ICD-10-CM

## 2014-11-27 DIAGNOSIS — N189 Chronic kidney disease, unspecified: Secondary | ICD-10-CM

## 2014-11-27 LAB — HEMOGLOBIN A1C: Hgb A1c MFr Bld: 4.7 % (ref 4.6–6.5)

## 2014-11-27 NOTE — Progress Notes (Signed)
Subjective:    Patient ID: Patrick [name], male    DOB: [birth year], 59 y.o.   MRN: [id number]  HPI Pt returns for f/u of diabetes mellitus: DM type: Insulin-requiring type 2 Dx'ed: 2014, when he presented with severe hyperglycemia.   Complications: polyneuropathy and renal failure.  Therapy: insulin since dx.   DKA: never. Severe hypoglycemia: never. Pancreatitis: never. Other: he takes multiple daily injections; he had transplant in 2009, but renal failure has recurred.  He is back on dialysis. Interval history: pt states he feels well in general. no cbg record, but states cbg's are well-controlled.  He denies hypoglycemia.   Past Medical History  Diagnosis Date  . Hypertension   . Diabetes mellitus without complication   . End-stage renal failure with renal transplant   . Gout   . Impotence of organic origin   . Type I (juvenile type) diabetes mellitus with renal manifestations, not stated as uncontrolled   . Secondary hyperparathyroidism     Past Surgical History  Procedure Laterality Date  . Birth defect heart surgery  1996  . Kidney transplant  2009  . Appendectomy    . Av fistula placement Right 09/18/2014    Procedure: INSERTION OF ARTERIOVENOUS (AV) GORE-TEX GRAFT ARM USING 4-7MM  X 45CM STRETCH GORE-TEX VASCULAR GRAFT;  Surgeon: Rosetta Posner, MD;  Location: [place];  Service: Vascular;  Laterality: Right;    History   Social History  . Marital Status: Married    Spouse Name: N/A  . Number of Children: N/A  . Years of Education: N/A   Occupational History  . Not on file.   Social History Main Topics  . Smoking status: Never Smoker   . Smokeless tobacco: Not on file  . Alcohol Use: No  . Drug Use: No  . Sexual Activity: Not Currently    Birth Control/ Protection: None   Other Topics Concern  . Not on file   Social History Narrative    Current Outpatient Prescriptions on File Prior to Visit  Medication Sig Dispense Refill  . aspirin 81 MG tablet Take  81 mg by mouth daily.    . calcitRIOL (ROCALTROL) 0.5 MCG capsule Take 3 capsules (1.5 mcg total) by mouth daily. 90 capsule 0  . calcium carbonate (OS-CAL - DOSED IN MG OF ELEMENTAL CALCIUM) 1250 MG tablet Take 2 tablets (1,000 mg of elemental calcium total) by mouth 2 (two) times daily with a meal. 120 tablet 0  . carvedilol (COREG) 25 MG tablet Take 25 mg by mouth 2 (two) times daily with a meal.    . glucose blood (ONE TOUCH ULTRA TEST) test strip Use to test blood glucose 2 times daily as instructed. Dx code: E11.9 100 each 2  . insulin lispro (HUMALOG) 100 UNIT/ML KiwkPen Inject 4 Units into the skin 3 (three) times daily with meals.    . Insulin Pen Needle 31G X 5 MM MISC Use to inject insulin 4 times per day. 100 each 2  . mycophenolate (CELLCEPT) 250 MG capsule Take 2 capsules (500 mg total) by mouth daily. 30 capsule 0  . ONETOUCH DELICA LANCETS 99991111 MISC Use to test blood sugar 2 times daily as instructed. Dx code: 250.41 100 each 2  . oxyCODONE-acetaminophen (PERCOCET/ROXICET) 5-325 MG per tablet Take 1-2 tablets by mouth every 4 (four) hours as needed for moderate pain. 30 tablet 0  . potassium chloride SA (K-DUR,KLOR-CON) 20 MEQ tablet Take 20 mEq by mouth 2 (two) times daily.    Marland Kitchen  predniSONE (DELTASONE) 5 MG tablet Take 5 mg by mouth daily.    . sodium bicarbonate 325 MG tablet Take 325 mg by mouth 2 (two) times daily.    . tacrolimus (PROGRAF) 5 MG capsule Take 1 capsule (5 mg total) by mouth 2 (two) times daily. 60 capsule 0   No current facility-administered medications on file prior to visit.    Allergies  Allergen Reactions  . Iodinated Diagnostic Agents Rash    Other Reaction: burning to mouth, swelling of lips.    No family history on file.  BP 118/70 mmHg  Pulse 73  Temp(Src) 98.1 F (36.7 C) (Oral)  Ht 5' 6.5" (1.689 m)  Wt 202 lb (91.627 kg)  BMI 32.12 kg/m2  SpO2 97%  Review of Systems Denies weight change.      Objective:   Physical Exam VITAL SIGNS:   See vs page GENERAL: no distress Pulses: dorsalis pedis intact bilat.   MSK: no deformity of the feet CV: no leg edema Skin:  no ulcer on the feet.  normal color and temp on the feet.  Old healed surgical scar on the left foot (bunionectomy) Neuro: sensation is intact to touch on the feet.     Lab Results  Component Value Date   HGBA1C 4.7 11/27/2014      Assessment & Plan:  DM: overcontrolled  Patient is advised the following: Patient Instructions  check your blood sugar twice a day.  vary the time of day when you check, between before the 3 meals, and at bedtime.  also check if you have symptoms of your blood sugar being too high or too low.  please keep a record of the readings and bring it to your next appointment here.  please call us sooner if your blood sugar goes below 70, or if you have a lot of readings over 200.    Please come back for a follow-up appointment in 3 months.  blood tests are being requested for you today.  We'll contact you with results.      addendum: d/c levemir

## 2014-11-27 NOTE — Patient Instructions (Signed)
check your blood sugar twice a day.  vary the time of day when you check, between before the 3 meals, and at bedtime.  also check if you have symptoms of your blood sugar being too high or too low.  please keep a record of the readings and bring it to your next appointment here.  please call us sooner if your blood sugar goes below 70, or if you have a lot of readings over 200.    Please come back for a follow-up appointment in 3 months.  blood tests are being requested for you today.  We'll contact you with results.

## 2014-11-28 DIAGNOSIS — R7881 Bacteremia: Secondary | ICD-10-CM | POA: Diagnosis not present

## 2014-11-28 DIAGNOSIS — A4189 Other specified sepsis: Secondary | ICD-10-CM | POA: Diagnosis not present

## 2014-11-28 DIAGNOSIS — D631 Anemia in chronic kidney disease: Secondary | ICD-10-CM | POA: Diagnosis not present

## 2014-11-28 DIAGNOSIS — R6883 Chills (without fever): Secondary | ICD-10-CM | POA: Diagnosis not present

## 2014-11-28 DIAGNOSIS — N186 End stage renal disease: Secondary | ICD-10-CM | POA: Diagnosis not present

## 2014-11-30 DIAGNOSIS — A4189 Other specified sepsis: Secondary | ICD-10-CM | POA: Diagnosis not present

## 2014-11-30 DIAGNOSIS — R7881 Bacteremia: Secondary | ICD-10-CM | POA: Diagnosis not present

## 2014-11-30 DIAGNOSIS — N186 End stage renal disease: Secondary | ICD-10-CM | POA: Diagnosis not present

## 2014-11-30 DIAGNOSIS — R6883 Chills (without fever): Secondary | ICD-10-CM | POA: Diagnosis not present

## 2014-11-30 DIAGNOSIS — D631 Anemia in chronic kidney disease: Secondary | ICD-10-CM | POA: Diagnosis not present

## 2014-12-02 DIAGNOSIS — R6883 Chills (without fever): Secondary | ICD-10-CM | POA: Diagnosis not present

## 2014-12-02 DIAGNOSIS — R7881 Bacteremia: Secondary | ICD-10-CM | POA: Diagnosis not present

## 2014-12-02 DIAGNOSIS — N186 End stage renal disease: Secondary | ICD-10-CM | POA: Diagnosis not present

## 2014-12-02 DIAGNOSIS — A4189 Other specified sepsis: Secondary | ICD-10-CM | POA: Diagnosis not present

## 2014-12-02 DIAGNOSIS — D631 Anemia in chronic kidney disease: Secondary | ICD-10-CM | POA: Diagnosis not present

## 2014-12-05 ENCOUNTER — Encounter: Payer: Self-pay | Admitting: Vascular Surgery

## 2014-12-05 DIAGNOSIS — N186 End stage renal disease: Secondary | ICD-10-CM | POA: Diagnosis not present

## 2014-12-05 DIAGNOSIS — D631 Anemia in chronic kidney disease: Secondary | ICD-10-CM | POA: Diagnosis not present

## 2014-12-05 DIAGNOSIS — R7881 Bacteremia: Secondary | ICD-10-CM | POA: Diagnosis not present

## 2014-12-05 DIAGNOSIS — A4189 Other specified sepsis: Secondary | ICD-10-CM | POA: Diagnosis not present

## 2014-12-05 DIAGNOSIS — R6883 Chills (without fever): Secondary | ICD-10-CM | POA: Diagnosis not present

## 2014-12-06 ENCOUNTER — Ambulatory Visit: Payer: Medicare Other | Admitting: Vascular Surgery

## 2014-12-06 ENCOUNTER — Ambulatory Visit (INDEPENDENT_AMBULATORY_CARE_PROVIDER_SITE_OTHER): Payer: Self-pay | Admitting: Vascular Surgery

## 2014-12-06 ENCOUNTER — Encounter: Payer: Self-pay | Admitting: Vascular Surgery

## 2014-12-06 VITALS — BP 107/64 | HR 66 | Ht 66.5 in | Wt 198.9 lb

## 2014-12-06 DIAGNOSIS — N186 End stage renal disease: Secondary | ICD-10-CM

## 2014-12-06 NOTE — Progress Notes (Signed)
Vascular and Vein Specialist of [facility]  Patient name: Patrick [name] MRN: [id number] DOB: 07-Mar-1956 Sex: male  REASON FOR VISIT: to evaluate AV graft  HPI: Patrick [name] is a 59 y.o. male had a new left upper arm graft placed on 09/18/2014 by Dr. Donnetta Hutching. Recently he has had some tenderness over the graft. In addition, he reportedly had a blood culture which was positive for staph epidermis. We were asked to evaluate his graft. He does state that he had chills once on dialysis but has had no documented fever. His graft has been working well although he states that the graft is tender in the upper part where there is some mild swelling.   Past Medical History  Diagnosis Date  . Hypertension   . Diabetes mellitus without complication   . End-stage renal failure with renal transplant   . Gout   . Impotence of organic origin   . Type I (juvenile type) diabetes mellitus with renal manifestations, not stated as uncontrolled   . Secondary hyperparathyroidism    Family History  Problem Relation Age of Onset  . Hyperlipidemia Mother   . Hypertension Mother    SOCIAL HISTORY: History  Substance Use Topics  . Smoking status: Former Research scientist (life sciences)  . Smokeless tobacco: Not on file  . Alcohol Use: No   Allergies  Allergen Reactions  . Iodinated Diagnostic Agents Rash    Other Reaction: burning to mouth, swelling of lips.   Current Outpatient Prescriptions  Medication Sig Dispense Refill  . aspirin 81 MG tablet Take 81 mg by mouth daily.    . calcitRIOL (ROCALTROL) 0.5 MCG capsule Take 3 capsules (1.5 mcg total) by mouth daily. 90 capsule 0  . carvedilol (COREG) 25 MG tablet Take 25 mg by mouth 2 (two) times daily with a meal.    . glucose blood (ONE TOUCH ULTRA TEST) test strip Use to test blood glucose 2 times daily as instructed. Dx code: E11.9 100 each 2  . insulin lispro (HUMALOG) 100 UNIT/ML KiwkPen Inject 4 Units into the skin 3 (three) times daily with meals.    . Insulin Pen Needle  31G X 5 MM MISC Use to inject insulin 4 times per day. 100 each 2  . mycophenolate (CELLCEPT) 250 MG capsule Take 2 capsules (500 mg total) by mouth daily. 30 capsule 0  . ONETOUCH DELICA LANCETS 99991111 MISC Use to test blood sugar 2 times daily as instructed. Dx code: 250.41 100 each 2  . oxyCODONE-acetaminophen (PERCOCET/ROXICET) 5-325 MG per tablet Take 1-2 tablets by mouth every 4 (four) hours as needed for moderate pain. 30 tablet 0  . predniSONE (DELTASONE) 5 MG tablet Take 5 mg by mouth daily.    . tacrolimus (PROGRAF) 5 MG capsule Take 1 capsule (5 mg total) by mouth 2 (two) times daily. 60 capsule 0  . calcium carbonate (OS-CAL - DOSED IN MG OF ELEMENTAL CALCIUM) 1250 MG tablet Take 2 tablets (1,000 mg of elemental calcium total) by mouth 2 (two) times daily with a meal. (Patient not taking: Reported on 12/06/2014) 120 tablet 0  . potassium chloride SA (K-DUR,KLOR-CON) 20 MEQ tablet Take 20 mEq by mouth 2 (two) times daily.    . sodium bicarbonate 325 MG tablet Take 325 mg by mouth 2 (two) times daily.     No current facility-administered medications for this visit.   REVIEW OF SYSTEMS: Valu.Nieves ] denotes positive finding; [  ] denotes negative finding  CARDIOVASCULAR:  [ ]  chest pain   [ ]   chest pressure   [ ]  palpitations   [ ]  orthopnea   [ ]  dyspnea on exertion   [ ]  claudication   [ ]  rest pain   [ ]  DVT   [ ]  phlebitis PULMONARY:   [ ]  productive cough   [ ]  asthma   [ ]  wheezing NEUROLOGIC:   [ ]  weakness  [ ]  paresthesias  [ ]  aphasia  [ ]  amaurosis  [ ]  dizziness HEMATOLOGIC:   [ ]  bleeding problems   [ ]  clotting disorders MUSCULOSKELETAL:  [ ]  joint pain   [ ]  joint swelling [ ]  leg swelling GASTROINTESTINAL: [ ]   blood in stool  [ ]   hematemesis GENITOURINARY:  [ ]   dysuria  [ ]   hematuria PSYCHIATRIC:  [ ]  history of major depression INTEGUMENTARY:  [ ]  rashes  [ ]  ulcers CONSTITUTIONAL:  [ ]  fever   [ ]  chills  PHYSICAL EXAM: Filed Vitals:   12/06/14 1348  BP: 107/64  Pulse:  66  Height: 5' 6.5" (1.689 m)  Weight: 198 lb 14.4 oz (90.22 kg)  SpO2: 96%   GENERAL: The patient is a well-nourished male, in no acute distress. The vital signs are documented above. CARDIOVASCULAR: There is a regular rate and rhythm. He has a diminished right radial pulse. PULMONARY: There is good air exchange bilaterally without wheezing or rales. His right upper arm graft has an excellent bruit and thrill. There is no erythema or drainage to suggest infection. The incisions are healed nicely. Graft is not pulsatile. There is some mild swelling in the upper part of the right upper arm and he may have had an infiltrate  MEDICAL ISSUES:   END-STAGE RENAL DISEASE: this graft appears to be functioning well. He is scheduled to receive antibiotics at the time of dialysis for the next week and I think this is a good idea. From our standpoint I do not see any obvious signs of infection. I do not see any indication for graft exploration and removal at this point. I have instructed them to cannulate the graft below the area that is tender. We will be happy to see him back at any time if he develops fevers or clear signs of infection.  Deitra Mayo [facility] of [place]: [phone]

## 2014-12-07 DIAGNOSIS — D631 Anemia in chronic kidney disease: Secondary | ICD-10-CM | POA: Diagnosis not present

## 2014-12-07 DIAGNOSIS — R7881 Bacteremia: Secondary | ICD-10-CM | POA: Diagnosis not present

## 2014-12-07 DIAGNOSIS — R6883 Chills (without fever): Secondary | ICD-10-CM | POA: Diagnosis not present

## 2014-12-07 DIAGNOSIS — A4189 Other specified sepsis: Secondary | ICD-10-CM | POA: Diagnosis not present

## 2014-12-07 DIAGNOSIS — N186 End stage renal disease: Secondary | ICD-10-CM | POA: Diagnosis not present

## 2014-12-09 DIAGNOSIS — N186 End stage renal disease: Secondary | ICD-10-CM | POA: Diagnosis not present

## 2014-12-09 DIAGNOSIS — R6883 Chills (without fever): Secondary | ICD-10-CM | POA: Diagnosis not present

## 2014-12-09 DIAGNOSIS — D631 Anemia in chronic kidney disease: Secondary | ICD-10-CM | POA: Diagnosis not present

## 2014-12-09 DIAGNOSIS — R7881 Bacteremia: Secondary | ICD-10-CM | POA: Diagnosis not present

## 2014-12-09 DIAGNOSIS — A4189 Other specified sepsis: Secondary | ICD-10-CM | POA: Diagnosis not present

## 2014-12-12 DIAGNOSIS — D631 Anemia in chronic kidney disease: Secondary | ICD-10-CM | POA: Diagnosis not present

## 2014-12-12 DIAGNOSIS — A4189 Other specified sepsis: Secondary | ICD-10-CM | POA: Diagnosis not present

## 2014-12-12 DIAGNOSIS — R7881 Bacteremia: Secondary | ICD-10-CM | POA: Diagnosis not present

## 2014-12-12 DIAGNOSIS — N186 End stage renal disease: Secondary | ICD-10-CM | POA: Diagnosis not present

## 2014-12-12 DIAGNOSIS — R6883 Chills (without fever): Secondary | ICD-10-CM | POA: Diagnosis not present

## 2014-12-13 ENCOUNTER — Ambulatory Visit: Payer: Medicare Other | Admitting: Vascular Surgery

## 2014-12-14 DIAGNOSIS — A4189 Other specified sepsis: Secondary | ICD-10-CM | POA: Diagnosis not present

## 2014-12-14 DIAGNOSIS — R7881 Bacteremia: Secondary | ICD-10-CM | POA: Diagnosis not present

## 2014-12-14 DIAGNOSIS — R6883 Chills (without fever): Secondary | ICD-10-CM | POA: Diagnosis not present

## 2014-12-14 DIAGNOSIS — D631 Anemia in chronic kidney disease: Secondary | ICD-10-CM | POA: Diagnosis not present

## 2014-12-14 DIAGNOSIS — N186 End stage renal disease: Secondary | ICD-10-CM | POA: Diagnosis not present

## 2014-12-16 DIAGNOSIS — N186 End stage renal disease: Secondary | ICD-10-CM | POA: Diagnosis not present

## 2014-12-16 DIAGNOSIS — D631 Anemia in chronic kidney disease: Secondary | ICD-10-CM | POA: Diagnosis not present

## 2014-12-16 DIAGNOSIS — R6883 Chills (without fever): Secondary | ICD-10-CM | POA: Diagnosis not present

## 2014-12-16 DIAGNOSIS — R7881 Bacteremia: Secondary | ICD-10-CM | POA: Diagnosis not present

## 2014-12-16 DIAGNOSIS — A4189 Other specified sepsis: Secondary | ICD-10-CM | POA: Diagnosis not present

## 2014-12-19 DIAGNOSIS — R6883 Chills (without fever): Secondary | ICD-10-CM | POA: Diagnosis not present

## 2014-12-19 DIAGNOSIS — R7881 Bacteremia: Secondary | ICD-10-CM | POA: Diagnosis not present

## 2014-12-19 DIAGNOSIS — A4189 Other specified sepsis: Secondary | ICD-10-CM | POA: Diagnosis not present

## 2014-12-19 DIAGNOSIS — N186 End stage renal disease: Secondary | ICD-10-CM | POA: Diagnosis not present

## 2014-12-19 DIAGNOSIS — D631 Anemia in chronic kidney disease: Secondary | ICD-10-CM | POA: Diagnosis not present

## 2014-12-21 DIAGNOSIS — R7881 Bacteremia: Secondary | ICD-10-CM | POA: Diagnosis not present

## 2014-12-21 DIAGNOSIS — A4189 Other specified sepsis: Secondary | ICD-10-CM | POA: Diagnosis not present

## 2014-12-21 DIAGNOSIS — R6883 Chills (without fever): Secondary | ICD-10-CM | POA: Diagnosis not present

## 2014-12-21 DIAGNOSIS — D631 Anemia in chronic kidney disease: Secondary | ICD-10-CM | POA: Diagnosis not present

## 2014-12-21 DIAGNOSIS — N186 End stage renal disease: Secondary | ICD-10-CM | POA: Diagnosis not present

## 2014-12-23 DIAGNOSIS — N186 End stage renal disease: Secondary | ICD-10-CM | POA: Diagnosis not present

## 2014-12-23 DIAGNOSIS — R7881 Bacteremia: Secondary | ICD-10-CM | POA: Diagnosis not present

## 2014-12-23 DIAGNOSIS — D631 Anemia in chronic kidney disease: Secondary | ICD-10-CM | POA: Diagnosis not present

## 2014-12-23 DIAGNOSIS — A4189 Other specified sepsis: Secondary | ICD-10-CM | POA: Diagnosis not present

## 2014-12-23 DIAGNOSIS — R6883 Chills (without fever): Secondary | ICD-10-CM | POA: Diagnosis not present

## 2014-12-26 DIAGNOSIS — D631 Anemia in chronic kidney disease: Secondary | ICD-10-CM | POA: Diagnosis not present

## 2014-12-26 DIAGNOSIS — Z992 Dependence on renal dialysis: Secondary | ICD-10-CM | POA: Diagnosis not present

## 2014-12-26 DIAGNOSIS — R6883 Chills (without fever): Secondary | ICD-10-CM | POA: Diagnosis not present

## 2014-12-26 DIAGNOSIS — N186 End stage renal disease: Secondary | ICD-10-CM | POA: Diagnosis not present

## 2014-12-26 DIAGNOSIS — T8610 Unspecified complication of kidney transplant: Secondary | ICD-10-CM | POA: Diagnosis not present

## 2014-12-26 DIAGNOSIS — R7881 Bacteremia: Secondary | ICD-10-CM | POA: Diagnosis not present

## 2014-12-26 DIAGNOSIS — A4189 Other specified sepsis: Secondary | ICD-10-CM | POA: Diagnosis not present

## 2014-12-28 DIAGNOSIS — N186 End stage renal disease: Secondary | ICD-10-CM | POA: Diagnosis not present

## 2014-12-28 DIAGNOSIS — R6883 Chills (without fever): Secondary | ICD-10-CM | POA: Diagnosis not present

## 2014-12-28 DIAGNOSIS — R7881 Bacteremia: Secondary | ICD-10-CM | POA: Diagnosis not present

## 2014-12-30 DIAGNOSIS — R7881 Bacteremia: Secondary | ICD-10-CM | POA: Diagnosis not present

## 2014-12-30 DIAGNOSIS — R6883 Chills (without fever): Secondary | ICD-10-CM | POA: Diagnosis not present

## 2014-12-30 DIAGNOSIS — N186 End stage renal disease: Secondary | ICD-10-CM | POA: Diagnosis not present

## 2015-01-02 DIAGNOSIS — N186 End stage renal disease: Secondary | ICD-10-CM | POA: Diagnosis not present

## 2015-01-02 DIAGNOSIS — R7881 Bacteremia: Secondary | ICD-10-CM | POA: Diagnosis not present

## 2015-01-02 DIAGNOSIS — R6883 Chills (without fever): Secondary | ICD-10-CM | POA: Diagnosis not present

## 2015-01-04 DIAGNOSIS — R6883 Chills (without fever): Secondary | ICD-10-CM | POA: Diagnosis not present

## 2015-01-04 DIAGNOSIS — R9431 Abnormal electrocardiogram [ECG] [EKG]: Secondary | ICD-10-CM | POA: Diagnosis not present

## 2015-01-04 DIAGNOSIS — Q245 Malformation of coronary vessels: Secondary | ICD-10-CM | POA: Diagnosis not present

## 2015-01-04 DIAGNOSIS — R7881 Bacteremia: Secondary | ICD-10-CM | POA: Diagnosis not present

## 2015-01-04 DIAGNOSIS — R0789 Other chest pain: Secondary | ICD-10-CM | POA: Diagnosis not present

## 2015-01-04 DIAGNOSIS — Z951 Presence of aortocoronary bypass graft: Secondary | ICD-10-CM | POA: Diagnosis not present

## 2015-01-04 DIAGNOSIS — N186 End stage renal disease: Secondary | ICD-10-CM | POA: Diagnosis not present

## 2015-01-06 DIAGNOSIS — R6883 Chills (without fever): Secondary | ICD-10-CM | POA: Diagnosis not present

## 2015-01-06 DIAGNOSIS — N186 End stage renal disease: Secondary | ICD-10-CM | POA: Diagnosis not present

## 2015-01-06 DIAGNOSIS — R7881 Bacteremia: Secondary | ICD-10-CM | POA: Diagnosis not present

## 2015-01-08 ENCOUNTER — Encounter: Payer: Self-pay | Admitting: Cardiology

## 2015-01-08 DIAGNOSIS — Z951 Presence of aortocoronary bypass graft: Secondary | ICD-10-CM | POA: Diagnosis not present

## 2015-01-08 DIAGNOSIS — I2510 Atherosclerotic heart disease of native coronary artery without angina pectoris: Secondary | ICD-10-CM | POA: Diagnosis not present

## 2015-01-08 DIAGNOSIS — I10 Essential (primary) hypertension: Secondary | ICD-10-CM | POA: Diagnosis not present

## 2015-01-08 DIAGNOSIS — E108 Type 1 diabetes mellitus with unspecified complications: Secondary | ICD-10-CM | POA: Diagnosis not present

## 2015-01-09 DIAGNOSIS — R7881 Bacteremia: Secondary | ICD-10-CM | POA: Diagnosis not present

## 2015-01-09 DIAGNOSIS — N186 End stage renal disease: Secondary | ICD-10-CM | POA: Diagnosis not present

## 2015-01-09 DIAGNOSIS — R6883 Chills (without fever): Secondary | ICD-10-CM | POA: Diagnosis not present

## 2015-01-11 DIAGNOSIS — N186 End stage renal disease: Secondary | ICD-10-CM | POA: Diagnosis not present

## 2015-01-11 DIAGNOSIS — R7881 Bacteremia: Secondary | ICD-10-CM | POA: Diagnosis not present

## 2015-01-11 DIAGNOSIS — R6883 Chills (without fever): Secondary | ICD-10-CM | POA: Diagnosis not present

## 2015-01-13 DIAGNOSIS — N186 End stage renal disease: Secondary | ICD-10-CM | POA: Diagnosis not present

## 2015-01-13 DIAGNOSIS — R7881 Bacteremia: Secondary | ICD-10-CM | POA: Diagnosis not present

## 2015-01-13 DIAGNOSIS — R6883 Chills (without fever): Secondary | ICD-10-CM | POA: Diagnosis not present

## 2015-01-16 DIAGNOSIS — R6883 Chills (without fever): Secondary | ICD-10-CM | POA: Diagnosis not present

## 2015-01-16 DIAGNOSIS — N186 End stage renal disease: Secondary | ICD-10-CM | POA: Diagnosis not present

## 2015-01-16 DIAGNOSIS — R7881 Bacteremia: Secondary | ICD-10-CM | POA: Diagnosis not present

## 2015-01-18 DIAGNOSIS — N186 End stage renal disease: Secondary | ICD-10-CM | POA: Diagnosis not present

## 2015-01-18 DIAGNOSIS — R7881 Bacteremia: Secondary | ICD-10-CM | POA: Diagnosis not present

## 2015-01-18 DIAGNOSIS — R6883 Chills (without fever): Secondary | ICD-10-CM | POA: Diagnosis not present

## 2015-01-20 DIAGNOSIS — N186 End stage renal disease: Secondary | ICD-10-CM | POA: Diagnosis not present

## 2015-01-20 DIAGNOSIS — R7881 Bacteremia: Secondary | ICD-10-CM | POA: Diagnosis not present

## 2015-01-20 DIAGNOSIS — R6883 Chills (without fever): Secondary | ICD-10-CM | POA: Diagnosis not present

## 2015-01-23 DIAGNOSIS — N186 End stage renal disease: Secondary | ICD-10-CM | POA: Diagnosis not present

## 2015-01-23 DIAGNOSIS — R7881 Bacteremia: Secondary | ICD-10-CM | POA: Diagnosis not present

## 2015-01-23 DIAGNOSIS — R6883 Chills (without fever): Secondary | ICD-10-CM | POA: Diagnosis not present

## 2015-01-25 DIAGNOSIS — R7881 Bacteremia: Secondary | ICD-10-CM | POA: Diagnosis not present

## 2015-01-25 DIAGNOSIS — R6883 Chills (without fever): Secondary | ICD-10-CM | POA: Diagnosis not present

## 2015-01-25 DIAGNOSIS — N186 End stage renal disease: Secondary | ICD-10-CM | POA: Diagnosis not present

## 2015-01-25 DIAGNOSIS — T8610 Unspecified complication of kidney transplant: Secondary | ICD-10-CM | POA: Diagnosis not present

## 2015-01-25 DIAGNOSIS — Z992 Dependence on renal dialysis: Secondary | ICD-10-CM | POA: Diagnosis not present

## 2015-01-26 DIAGNOSIS — J189 Pneumonia, unspecified organism: Secondary | ICD-10-CM

## 2015-01-26 HISTORY — DX: Pneumonia, unspecified organism: J18.9

## 2015-01-27 DIAGNOSIS — E1029 Type 1 diabetes mellitus with other diabetic kidney complication: Secondary | ICD-10-CM | POA: Diagnosis not present

## 2015-01-27 DIAGNOSIS — N186 End stage renal disease: Secondary | ICD-10-CM | POA: Diagnosis not present

## 2015-01-30 DIAGNOSIS — N186 End stage renal disease: Secondary | ICD-10-CM | POA: Diagnosis not present

## 2015-01-30 DIAGNOSIS — E1029 Type 1 diabetes mellitus with other diabetic kidney complication: Secondary | ICD-10-CM | POA: Diagnosis not present

## 2015-02-01 DIAGNOSIS — E1029 Type 1 diabetes mellitus with other diabetic kidney complication: Secondary | ICD-10-CM | POA: Diagnosis not present

## 2015-02-01 DIAGNOSIS — N186 End stage renal disease: Secondary | ICD-10-CM | POA: Diagnosis not present

## 2015-02-03 DIAGNOSIS — N186 End stage renal disease: Secondary | ICD-10-CM | POA: Diagnosis not present

## 2015-02-03 DIAGNOSIS — E1029 Type 1 diabetes mellitus with other diabetic kidney complication: Secondary | ICD-10-CM | POA: Diagnosis not present

## 2015-02-06 DIAGNOSIS — E1029 Type 1 diabetes mellitus with other diabetic kidney complication: Secondary | ICD-10-CM | POA: Diagnosis not present

## 2015-02-06 DIAGNOSIS — N186 End stage renal disease: Secondary | ICD-10-CM | POA: Diagnosis not present

## 2015-02-08 DIAGNOSIS — N186 End stage renal disease: Secondary | ICD-10-CM | POA: Diagnosis not present

## 2015-02-08 DIAGNOSIS — E1029 Type 1 diabetes mellitus with other diabetic kidney complication: Secondary | ICD-10-CM | POA: Diagnosis not present

## 2015-02-10 DIAGNOSIS — N186 End stage renal disease: Secondary | ICD-10-CM | POA: Diagnosis not present

## 2015-02-10 DIAGNOSIS — E1029 Type 1 diabetes mellitus with other diabetic kidney complication: Secondary | ICD-10-CM | POA: Diagnosis not present

## 2015-02-13 DIAGNOSIS — E1029 Type 1 diabetes mellitus with other diabetic kidney complication: Secondary | ICD-10-CM | POA: Diagnosis not present

## 2015-02-13 DIAGNOSIS — N186 End stage renal disease: Secondary | ICD-10-CM | POA: Diagnosis not present

## 2015-02-15 DIAGNOSIS — E1029 Type 1 diabetes mellitus with other diabetic kidney complication: Secondary | ICD-10-CM | POA: Diagnosis not present

## 2015-02-15 DIAGNOSIS — N186 End stage renal disease: Secondary | ICD-10-CM | POA: Diagnosis not present

## 2015-02-17 DIAGNOSIS — E1029 Type 1 diabetes mellitus with other diabetic kidney complication: Secondary | ICD-10-CM | POA: Diagnosis not present

## 2015-02-17 DIAGNOSIS — N186 End stage renal disease: Secondary | ICD-10-CM | POA: Diagnosis not present

## 2015-02-20 DIAGNOSIS — N186 End stage renal disease: Secondary | ICD-10-CM | POA: Diagnosis not present

## 2015-02-20 DIAGNOSIS — E1029 Type 1 diabetes mellitus with other diabetic kidney complication: Secondary | ICD-10-CM | POA: Diagnosis not present

## 2015-02-22 DIAGNOSIS — E1029 Type 1 diabetes mellitus with other diabetic kidney complication: Secondary | ICD-10-CM | POA: Diagnosis not present

## 2015-02-22 DIAGNOSIS — N186 End stage renal disease: Secondary | ICD-10-CM | POA: Diagnosis not present

## 2015-02-24 DIAGNOSIS — E1029 Type 1 diabetes mellitus with other diabetic kidney complication: Secondary | ICD-10-CM | POA: Diagnosis not present

## 2015-02-24 DIAGNOSIS — N186 End stage renal disease: Secondary | ICD-10-CM | POA: Diagnosis not present

## 2015-02-25 DIAGNOSIS — Z992 Dependence on renal dialysis: Secondary | ICD-10-CM | POA: Diagnosis not present

## 2015-02-25 DIAGNOSIS — T8610 Unspecified complication of kidney transplant: Secondary | ICD-10-CM | POA: Diagnosis not present

## 2015-02-25 DIAGNOSIS — N186 End stage renal disease: Secondary | ICD-10-CM | POA: Diagnosis not present

## 2015-02-27 DIAGNOSIS — N186 End stage renal disease: Secondary | ICD-10-CM | POA: Diagnosis not present

## 2015-02-27 DIAGNOSIS — E1029 Type 1 diabetes mellitus with other diabetic kidney complication: Secondary | ICD-10-CM | POA: Diagnosis not present

## 2015-02-27 DIAGNOSIS — D509 Iron deficiency anemia, unspecified: Secondary | ICD-10-CM | POA: Diagnosis not present

## 2015-02-27 DIAGNOSIS — D631 Anemia in chronic kidney disease: Secondary | ICD-10-CM | POA: Diagnosis not present

## 2015-03-01 DIAGNOSIS — E1029 Type 1 diabetes mellitus with other diabetic kidney complication: Secondary | ICD-10-CM | POA: Diagnosis not present

## 2015-03-01 DIAGNOSIS — D631 Anemia in chronic kidney disease: Secondary | ICD-10-CM | POA: Diagnosis not present

## 2015-03-01 DIAGNOSIS — D509 Iron deficiency anemia, unspecified: Secondary | ICD-10-CM | POA: Diagnosis not present

## 2015-03-01 DIAGNOSIS — N186 End stage renal disease: Secondary | ICD-10-CM | POA: Diagnosis not present

## 2015-03-03 DIAGNOSIS — D509 Iron deficiency anemia, unspecified: Secondary | ICD-10-CM | POA: Diagnosis not present

## 2015-03-03 DIAGNOSIS — D631 Anemia in chronic kidney disease: Secondary | ICD-10-CM | POA: Diagnosis not present

## 2015-03-03 DIAGNOSIS — N186 End stage renal disease: Secondary | ICD-10-CM | POA: Diagnosis not present

## 2015-03-03 DIAGNOSIS — E1029 Type 1 diabetes mellitus with other diabetic kidney complication: Secondary | ICD-10-CM | POA: Diagnosis not present

## 2015-03-06 ENCOUNTER — Ambulatory Visit (INDEPENDENT_AMBULATORY_CARE_PROVIDER_SITE_OTHER): Payer: Medicare Other | Admitting: Endocrinology

## 2015-03-06 ENCOUNTER — Encounter: Payer: Self-pay | Admitting: Endocrinology

## 2015-03-06 VITALS — BP 126/83 | HR 71 | Temp 98.9°F | Ht 66.5 in | Wt 217.0 lb

## 2015-03-06 DIAGNOSIS — N189 Chronic kidney disease, unspecified: Secondary | ICD-10-CM

## 2015-03-06 DIAGNOSIS — D631 Anemia in chronic kidney disease: Secondary | ICD-10-CM | POA: Diagnosis not present

## 2015-03-06 DIAGNOSIS — D509 Iron deficiency anemia, unspecified: Secondary | ICD-10-CM | POA: Diagnosis not present

## 2015-03-06 DIAGNOSIS — N186 End stage renal disease: Secondary | ICD-10-CM | POA: Diagnosis not present

## 2015-03-06 DIAGNOSIS — E1029 Type 1 diabetes mellitus with other diabetic kidney complication: Secondary | ICD-10-CM | POA: Diagnosis not present

## 2015-03-06 DIAGNOSIS — E1022 Type 1 diabetes mellitus with diabetic chronic kidney disease: Secondary | ICD-10-CM | POA: Diagnosis not present

## 2015-03-06 LAB — POCT GLYCOSYLATED HEMOGLOBIN (HGB A1C): HEMOGLOBIN A1C: 6.3

## 2015-03-06 MED ORDER — REPAGLINIDE 0.5 MG PO TABS: 0.5000 mg | ORAL_TABLET | Freq: Three times a day (TID) | ORAL | Status: DC

## 2015-03-06 NOTE — Patient Instructions (Addendum)
i have sent a prescription to your pharmacy, to change the insulin to a pill called "repaglinide."  check your blood sugar once a day.  vary the time of day when you check, between before the 3 meals, and at bedtime.  also check if you have symptoms of your blood sugar being too high or too low.  please keep a record of the readings and bring it to your next appointment here.  You can write it on any piece of paper.  please call us sooner if your blood sugar goes below 70, or if you have a lot of readings over 200. Please come back for a follow-up appointment in 3 months.

## 2015-03-06 NOTE — Progress Notes (Signed)
Subjective:    Patient ID: Patrick [name], male    DOB: [birth year], 59 y.o.   MRN: [id number]  HPI Pt returns for f/u of diabetes mellitus: DM type: Insulin-requiring type 2 Dx'ed: 2014, when he presented with severe hyperglycemia.   Complications: polyneuropathy and renal failure.  Therapy: insulin since dx.   DKA: never. Severe hypoglycemia: never. Pancreatitis: never. Other: he takes multiple daily injections; he had transplant in 2009, but renal failure has recurred.  He is back on dialysis. Interval history: pt states he feels well in general. no cbg record, but states cbg's are well-controlled.  He denies hypoglycemia. Past Medical History  Diagnosis Date  . Hypertension   . Diabetes mellitus without complication   . End-stage renal failure with renal transplant   . Gout   . Impotence of organic origin   . Type I (juvenile type) diabetes mellitus with renal manifestations, not stated as uncontrolled   . Secondary hyperparathyroidism     Past Surgical History  Procedure Laterality Date  . Birth defect heart surgery  1996  . Kidney transplant  2009  . Appendectomy    . Av fistula placement Right 09/18/2014    Procedure: INSERTION OF ARTERIOVENOUS (AV) GORE-TEX GRAFT ARM USING 4-7MM  X 45CM STRETCH GORE-TEX VASCULAR GRAFT;  Surgeon: Rosetta Posner, MD;  Location: [facility];  Service: Vascular;  Laterality: Right;    History   Social History  . Marital Status: Married    Spouse Name: N/A  . Number of Children: N/A  . Years of Education: N/A   Occupational History  . Not on file.   Social History Main Topics  . Smoking status: Former Research scientist (life sciences)  . Smokeless tobacco: Not on file  . Alcohol Use: No  . Drug Use: No  . Sexual Activity: Not Currently    Birth Control/ Protection: None   Other Topics Concern  . Not on file   Social History Narrative    Current Outpatient Prescriptions on File Prior to Visit  Medication Sig Dispense Refill  . aspirin 81 MG tablet Take 81  mg by mouth daily.    . calcitRIOL (ROCALTROL) 0.5 MCG capsule Take 3 capsules (1.5 mcg total) by mouth daily. 90 capsule 0  . calcium carbonate (OS-CAL - DOSED IN MG OF ELEMENTAL CALCIUM) 1250 MG tablet Take 2 tablets (1,000 mg of elemental calcium total) by mouth 2 (two) times daily with a meal. 120 tablet 0  . carvedilol (COREG) 25 MG tablet Take 25 mg by mouth 2 (two) times daily with a meal.    . glucose blood (ONE TOUCH ULTRA TEST) test strip Use to test blood glucose 2 times daily as instructed. Dx code: E11.9 100 each 2  . Insulin Pen Needle 31G X 5 MM MISC Use to inject insulin 4 times per day. 100 each 2  . ONETOUCH DELICA LANCETS 99991111 MISC Use to test blood sugar 2 times daily as instructed. Dx code: 250.41 100 each 2  . oxyCODONE-acetaminophen (PERCOCET/ROXICET) 5-325 MG per tablet Take 1-2 tablets by mouth every 4 (four) hours as needed for moderate pain. 30 tablet 0  . potassium chloride SA (K-DUR,KLOR-CON) 20 MEQ tablet Take 20 mEq by mouth 2 (two) times daily.    . predniSONE (DELTASONE) 5 MG tablet Take 5 mg by mouth daily.    . sodium bicarbonate 325 MG tablet Take 325 mg by mouth 2 (two) times daily.    . tacrolimus (PROGRAF) 5 MG capsule Take 1 capsule (5  mg total) by mouth 2 (two) times daily. 60 capsule 0  . mycophenolate (CELLCEPT) 250 MG capsule Take 2 capsules (500 mg total) by mouth daily. (Patient not taking: Reported on 03/06/2015) 30 capsule 0   No current facility-administered medications on file prior to visit.    Allergies  Allergen Reactions  . Iodinated Diagnostic Agents Rash    Other Reaction: burning to mouth, swelling of lips.    Family History  Problem Relation Age of Onset  . Hyperlipidemia Mother   . Hypertension Mother     BP 126/83 mmHg  Pulse 71  Temp(Src) 98.9 F (37.2 C) (Oral)  Ht 5' 6.5" (1.689 m)  Wt 217 lb (98.431 kg)  BMI 34.50 kg/m2  SpO2 97%  Review of Systems He denies weight change.    Objective:   Physical Exam VITAL  SIGNS:  See vs page GENERAL: no distress Pulses: dorsalis pedis intact bilat.   MSK: no deformity of the feet CV: no leg edema Skin:  no ulcer on the feet.  normal color and temp on the feet. Neuro: sensation is intact to touch on the feet.     A1c=6.3%    Assessment & Plan:  DM: insulin requirement has decreased, prob due to renal failure.  He is ready for a trial back on oral rx.   Patient is advised the following: Patient Instructions  i have sent a prescription to your pharmacy, to change the insulin to a pill called "repaglinide."  check your blood sugar once a day.  vary the time of day when you check, between before the 3 meals, and at bedtime.  also check if you have symptoms of your blood sugar being too high or too low.  please keep a record of the readings and bring it to your next appointment here.  You can write it on any piece of paper.  please call us sooner if your blood sugar goes below 70, or if you have a lot of readings over 200. Please come back for a follow-up appointment in 3 months.

## 2015-03-08 DIAGNOSIS — N186 End stage renal disease: Secondary | ICD-10-CM | POA: Diagnosis not present

## 2015-03-08 DIAGNOSIS — D509 Iron deficiency anemia, unspecified: Secondary | ICD-10-CM | POA: Diagnosis not present

## 2015-03-08 DIAGNOSIS — D631 Anemia in chronic kidney disease: Secondary | ICD-10-CM | POA: Diagnosis not present

## 2015-03-08 DIAGNOSIS — E1029 Type 1 diabetes mellitus with other diabetic kidney complication: Secondary | ICD-10-CM | POA: Diagnosis not present

## 2015-03-10 DIAGNOSIS — N186 End stage renal disease: Secondary | ICD-10-CM | POA: Diagnosis not present

## 2015-03-10 DIAGNOSIS — E1029 Type 1 diabetes mellitus with other diabetic kidney complication: Secondary | ICD-10-CM | POA: Diagnosis not present

## 2015-03-10 DIAGNOSIS — D509 Iron deficiency anemia, unspecified: Secondary | ICD-10-CM | POA: Diagnosis not present

## 2015-03-10 DIAGNOSIS — D631 Anemia in chronic kidney disease: Secondary | ICD-10-CM | POA: Diagnosis not present

## 2015-03-13 DIAGNOSIS — N186 End stage renal disease: Secondary | ICD-10-CM | POA: Diagnosis not present

## 2015-03-13 DIAGNOSIS — D631 Anemia in chronic kidney disease: Secondary | ICD-10-CM | POA: Diagnosis not present

## 2015-03-13 DIAGNOSIS — E1029 Type 1 diabetes mellitus with other diabetic kidney complication: Secondary | ICD-10-CM | POA: Diagnosis not present

## 2015-03-13 DIAGNOSIS — D509 Iron deficiency anemia, unspecified: Secondary | ICD-10-CM | POA: Diagnosis not present

## 2015-03-15 DIAGNOSIS — N186 End stage renal disease: Secondary | ICD-10-CM | POA: Diagnosis not present

## 2015-03-15 DIAGNOSIS — D631 Anemia in chronic kidney disease: Secondary | ICD-10-CM | POA: Diagnosis not present

## 2015-03-15 DIAGNOSIS — D509 Iron deficiency anemia, unspecified: Secondary | ICD-10-CM | POA: Diagnosis not present

## 2015-03-15 DIAGNOSIS — E1029 Type 1 diabetes mellitus with other diabetic kidney complication: Secondary | ICD-10-CM | POA: Diagnosis not present

## 2015-03-17 DIAGNOSIS — N186 End stage renal disease: Secondary | ICD-10-CM | POA: Diagnosis not present

## 2015-03-17 DIAGNOSIS — E1029 Type 1 diabetes mellitus with other diabetic kidney complication: Secondary | ICD-10-CM | POA: Diagnosis not present

## 2015-03-17 DIAGNOSIS — D509 Iron deficiency anemia, unspecified: Secondary | ICD-10-CM | POA: Diagnosis not present

## 2015-03-17 DIAGNOSIS — D631 Anemia in chronic kidney disease: Secondary | ICD-10-CM | POA: Diagnosis not present

## 2015-03-20 DIAGNOSIS — N186 End stage renal disease: Secondary | ICD-10-CM | POA: Diagnosis not present

## 2015-03-20 DIAGNOSIS — D509 Iron deficiency anemia, unspecified: Secondary | ICD-10-CM | POA: Diagnosis not present

## 2015-03-20 DIAGNOSIS — D631 Anemia in chronic kidney disease: Secondary | ICD-10-CM | POA: Diagnosis not present

## 2015-03-20 DIAGNOSIS — E1029 Type 1 diabetes mellitus with other diabetic kidney complication: Secondary | ICD-10-CM | POA: Diagnosis not present

## 2015-03-23 DIAGNOSIS — T82858D Stenosis of vascular prosthetic devices, implants and grafts, subsequent encounter: Secondary | ICD-10-CM | POA: Diagnosis not present

## 2015-03-23 DIAGNOSIS — D631 Anemia in chronic kidney disease: Secondary | ICD-10-CM | POA: Diagnosis not present

## 2015-03-23 DIAGNOSIS — Z992 Dependence on renal dialysis: Secondary | ICD-10-CM | POA: Diagnosis not present

## 2015-03-23 DIAGNOSIS — N186 End stage renal disease: Secondary | ICD-10-CM | POA: Diagnosis not present

## 2015-03-23 DIAGNOSIS — I871 Compression of vein: Secondary | ICD-10-CM | POA: Diagnosis not present

## 2015-03-23 DIAGNOSIS — D509 Iron deficiency anemia, unspecified: Secondary | ICD-10-CM | POA: Diagnosis not present

## 2015-03-23 DIAGNOSIS — E1029 Type 1 diabetes mellitus with other diabetic kidney complication: Secondary | ICD-10-CM | POA: Diagnosis not present

## 2015-03-24 DIAGNOSIS — E1029 Type 1 diabetes mellitus with other diabetic kidney complication: Secondary | ICD-10-CM | POA: Diagnosis not present

## 2015-03-24 DIAGNOSIS — D509 Iron deficiency anemia, unspecified: Secondary | ICD-10-CM | POA: Diagnosis not present

## 2015-03-24 DIAGNOSIS — D631 Anemia in chronic kidney disease: Secondary | ICD-10-CM | POA: Diagnosis not present

## 2015-03-24 DIAGNOSIS — N186 End stage renal disease: Secondary | ICD-10-CM | POA: Diagnosis not present

## 2015-03-27 DIAGNOSIS — D509 Iron deficiency anemia, unspecified: Secondary | ICD-10-CM | POA: Diagnosis not present

## 2015-03-27 DIAGNOSIS — E1029 Type 1 diabetes mellitus with other diabetic kidney complication: Secondary | ICD-10-CM | POA: Diagnosis not present

## 2015-03-27 DIAGNOSIS — D631 Anemia in chronic kidney disease: Secondary | ICD-10-CM | POA: Diagnosis not present

## 2015-03-27 DIAGNOSIS — N186 End stage renal disease: Secondary | ICD-10-CM | POA: Diagnosis not present

## 2015-03-28 DIAGNOSIS — Z992 Dependence on renal dialysis: Secondary | ICD-10-CM | POA: Diagnosis not present

## 2015-03-28 DIAGNOSIS — T8610 Unspecified complication of kidney transplant: Secondary | ICD-10-CM | POA: Diagnosis not present

## 2015-03-28 DIAGNOSIS — N186 End stage renal disease: Secondary | ICD-10-CM | POA: Diagnosis not present

## 2015-03-29 DIAGNOSIS — E1029 Type 1 diabetes mellitus with other diabetic kidney complication: Secondary | ICD-10-CM | POA: Diagnosis not present

## 2015-03-29 DIAGNOSIS — N186 End stage renal disease: Secondary | ICD-10-CM | POA: Diagnosis not present

## 2015-03-29 DIAGNOSIS — D509 Iron deficiency anemia, unspecified: Secondary | ICD-10-CM | POA: Diagnosis not present

## 2015-03-29 DIAGNOSIS — D631 Anemia in chronic kidney disease: Secondary | ICD-10-CM | POA: Diagnosis not present

## 2015-03-31 DIAGNOSIS — E1029 Type 1 diabetes mellitus with other diabetic kidney complication: Secondary | ICD-10-CM | POA: Diagnosis not present

## 2015-03-31 DIAGNOSIS — D509 Iron deficiency anemia, unspecified: Secondary | ICD-10-CM | POA: Diagnosis not present

## 2015-03-31 DIAGNOSIS — N186 End stage renal disease: Secondary | ICD-10-CM | POA: Diagnosis not present

## 2015-03-31 DIAGNOSIS — D631 Anemia in chronic kidney disease: Secondary | ICD-10-CM | POA: Diagnosis not present

## 2015-04-03 DIAGNOSIS — D631 Anemia in chronic kidney disease: Secondary | ICD-10-CM | POA: Diagnosis not present

## 2015-04-03 DIAGNOSIS — N186 End stage renal disease: Secondary | ICD-10-CM | POA: Diagnosis not present

## 2015-04-03 DIAGNOSIS — D509 Iron deficiency anemia, unspecified: Secondary | ICD-10-CM | POA: Diagnosis not present

## 2015-04-03 DIAGNOSIS — E1029 Type 1 diabetes mellitus with other diabetic kidney complication: Secondary | ICD-10-CM | POA: Diagnosis not present

## 2015-04-05 DIAGNOSIS — N186 End stage renal disease: Secondary | ICD-10-CM | POA: Diagnosis not present

## 2015-04-05 DIAGNOSIS — E1029 Type 1 diabetes mellitus with other diabetic kidney complication: Secondary | ICD-10-CM | POA: Diagnosis not present

## 2015-04-05 DIAGNOSIS — D509 Iron deficiency anemia, unspecified: Secondary | ICD-10-CM | POA: Diagnosis not present

## 2015-04-05 DIAGNOSIS — D631 Anemia in chronic kidney disease: Secondary | ICD-10-CM | POA: Diagnosis not present

## 2015-04-07 DIAGNOSIS — D631 Anemia in chronic kidney disease: Secondary | ICD-10-CM | POA: Diagnosis not present

## 2015-04-07 DIAGNOSIS — D509 Iron deficiency anemia, unspecified: Secondary | ICD-10-CM | POA: Diagnosis not present

## 2015-04-07 DIAGNOSIS — N186 End stage renal disease: Secondary | ICD-10-CM | POA: Diagnosis not present

## 2015-04-07 DIAGNOSIS — E1029 Type 1 diabetes mellitus with other diabetic kidney complication: Secondary | ICD-10-CM | POA: Diagnosis not present

## 2015-04-10 DIAGNOSIS — E1029 Type 1 diabetes mellitus with other diabetic kidney complication: Secondary | ICD-10-CM | POA: Diagnosis not present

## 2015-04-10 DIAGNOSIS — D631 Anemia in chronic kidney disease: Secondary | ICD-10-CM | POA: Diagnosis not present

## 2015-04-10 DIAGNOSIS — N186 End stage renal disease: Secondary | ICD-10-CM | POA: Diagnosis not present

## 2015-04-10 DIAGNOSIS — D509 Iron deficiency anemia, unspecified: Secondary | ICD-10-CM | POA: Diagnosis not present

## 2015-04-12 DIAGNOSIS — D509 Iron deficiency anemia, unspecified: Secondary | ICD-10-CM | POA: Diagnosis not present

## 2015-04-12 DIAGNOSIS — N186 End stage renal disease: Secondary | ICD-10-CM | POA: Diagnosis not present

## 2015-04-12 DIAGNOSIS — E1029 Type 1 diabetes mellitus with other diabetic kidney complication: Secondary | ICD-10-CM | POA: Diagnosis not present

## 2015-04-12 DIAGNOSIS — D631 Anemia in chronic kidney disease: Secondary | ICD-10-CM | POA: Diagnosis not present

## 2015-04-14 DIAGNOSIS — N186 End stage renal disease: Secondary | ICD-10-CM | POA: Diagnosis not present

## 2015-04-14 DIAGNOSIS — D631 Anemia in chronic kidney disease: Secondary | ICD-10-CM | POA: Diagnosis not present

## 2015-04-14 DIAGNOSIS — D509 Iron deficiency anemia, unspecified: Secondary | ICD-10-CM | POA: Diagnosis not present

## 2015-04-14 DIAGNOSIS — E1029 Type 1 diabetes mellitus with other diabetic kidney complication: Secondary | ICD-10-CM | POA: Diagnosis not present

## 2015-04-17 DIAGNOSIS — E1029 Type 1 diabetes mellitus with other diabetic kidney complication: Secondary | ICD-10-CM | POA: Diagnosis not present

## 2015-04-17 DIAGNOSIS — D631 Anemia in chronic kidney disease: Secondary | ICD-10-CM | POA: Diagnosis not present

## 2015-04-17 DIAGNOSIS — D509 Iron deficiency anemia, unspecified: Secondary | ICD-10-CM | POA: Diagnosis not present

## 2015-04-17 DIAGNOSIS — N186 End stage renal disease: Secondary | ICD-10-CM | POA: Diagnosis not present

## 2015-04-19 DIAGNOSIS — N186 End stage renal disease: Secondary | ICD-10-CM | POA: Diagnosis not present

## 2015-04-19 DIAGNOSIS — D509 Iron deficiency anemia, unspecified: Secondary | ICD-10-CM | POA: Diagnosis not present

## 2015-04-19 DIAGNOSIS — D631 Anemia in chronic kidney disease: Secondary | ICD-10-CM | POA: Diagnosis not present

## 2015-04-19 DIAGNOSIS — E1029 Type 1 diabetes mellitus with other diabetic kidney complication: Secondary | ICD-10-CM | POA: Diagnosis not present

## 2015-04-21 DIAGNOSIS — N186 End stage renal disease: Secondary | ICD-10-CM | POA: Diagnosis not present

## 2015-04-21 DIAGNOSIS — D631 Anemia in chronic kidney disease: Secondary | ICD-10-CM | POA: Diagnosis not present

## 2015-04-21 DIAGNOSIS — D509 Iron deficiency anemia, unspecified: Secondary | ICD-10-CM | POA: Diagnosis not present

## 2015-04-21 DIAGNOSIS — E1029 Type 1 diabetes mellitus with other diabetic kidney complication: Secondary | ICD-10-CM | POA: Diagnosis not present

## 2015-04-24 DIAGNOSIS — D509 Iron deficiency anemia, unspecified: Secondary | ICD-10-CM | POA: Diagnosis not present

## 2015-04-24 DIAGNOSIS — N186 End stage renal disease: Secondary | ICD-10-CM | POA: Diagnosis not present

## 2015-04-24 DIAGNOSIS — D631 Anemia in chronic kidney disease: Secondary | ICD-10-CM | POA: Diagnosis not present

## 2015-04-24 DIAGNOSIS — E1029 Type 1 diabetes mellitus with other diabetic kidney complication: Secondary | ICD-10-CM | POA: Diagnosis not present

## 2015-04-26 DIAGNOSIS — D509 Iron deficiency anemia, unspecified: Secondary | ICD-10-CM | POA: Diagnosis not present

## 2015-04-26 DIAGNOSIS — N186 End stage renal disease: Secondary | ICD-10-CM | POA: Diagnosis not present

## 2015-04-26 DIAGNOSIS — E1029 Type 1 diabetes mellitus with other diabetic kidney complication: Secondary | ICD-10-CM | POA: Diagnosis not present

## 2015-04-26 DIAGNOSIS — D631 Anemia in chronic kidney disease: Secondary | ICD-10-CM | POA: Diagnosis not present

## 2015-04-27 DIAGNOSIS — Z992 Dependence on renal dialysis: Secondary | ICD-10-CM | POA: Diagnosis not present

## 2015-04-27 DIAGNOSIS — T8610 Unspecified complication of kidney transplant: Secondary | ICD-10-CM | POA: Diagnosis not present

## 2015-04-27 DIAGNOSIS — N186 End stage renal disease: Secondary | ICD-10-CM | POA: Diagnosis not present

## 2015-04-28 DIAGNOSIS — E1029 Type 1 diabetes mellitus with other diabetic kidney complication: Secondary | ICD-10-CM | POA: Diagnosis not present

## 2015-04-28 DIAGNOSIS — D509 Iron deficiency anemia, unspecified: Secondary | ICD-10-CM | POA: Diagnosis not present

## 2015-04-28 DIAGNOSIS — N186 End stage renal disease: Secondary | ICD-10-CM | POA: Diagnosis not present

## 2015-04-28 DIAGNOSIS — D631 Anemia in chronic kidney disease: Secondary | ICD-10-CM | POA: Diagnosis not present

## 2015-05-01 DIAGNOSIS — D631 Anemia in chronic kidney disease: Secondary | ICD-10-CM | POA: Diagnosis not present

## 2015-05-01 DIAGNOSIS — D509 Iron deficiency anemia, unspecified: Secondary | ICD-10-CM | POA: Diagnosis not present

## 2015-05-01 DIAGNOSIS — E1029 Type 1 diabetes mellitus with other diabetic kidney complication: Secondary | ICD-10-CM | POA: Diagnosis not present

## 2015-05-01 DIAGNOSIS — N186 End stage renal disease: Secondary | ICD-10-CM | POA: Diagnosis not present

## 2015-05-03 DIAGNOSIS — D631 Anemia in chronic kidney disease: Secondary | ICD-10-CM | POA: Diagnosis not present

## 2015-05-03 DIAGNOSIS — N186 End stage renal disease: Secondary | ICD-10-CM | POA: Diagnosis not present

## 2015-05-03 DIAGNOSIS — D509 Iron deficiency anemia, unspecified: Secondary | ICD-10-CM | POA: Diagnosis not present

## 2015-05-03 DIAGNOSIS — E1029 Type 1 diabetes mellitus with other diabetic kidney complication: Secondary | ICD-10-CM | POA: Diagnosis not present

## 2015-05-05 DIAGNOSIS — D509 Iron deficiency anemia, unspecified: Secondary | ICD-10-CM | POA: Diagnosis not present

## 2015-05-05 DIAGNOSIS — E1029 Type 1 diabetes mellitus with other diabetic kidney complication: Secondary | ICD-10-CM | POA: Diagnosis not present

## 2015-05-05 DIAGNOSIS — D631 Anemia in chronic kidney disease: Secondary | ICD-10-CM | POA: Diagnosis not present

## 2015-05-05 DIAGNOSIS — N186 End stage renal disease: Secondary | ICD-10-CM | POA: Diagnosis not present

## 2015-05-08 DIAGNOSIS — D509 Iron deficiency anemia, unspecified: Secondary | ICD-10-CM | POA: Diagnosis not present

## 2015-05-08 DIAGNOSIS — E1029 Type 1 diabetes mellitus with other diabetic kidney complication: Secondary | ICD-10-CM | POA: Diagnosis not present

## 2015-05-08 DIAGNOSIS — D631 Anemia in chronic kidney disease: Secondary | ICD-10-CM | POA: Diagnosis not present

## 2015-05-08 DIAGNOSIS — N186 End stage renal disease: Secondary | ICD-10-CM | POA: Diagnosis not present

## 2015-05-10 DIAGNOSIS — N186 End stage renal disease: Secondary | ICD-10-CM | POA: Diagnosis not present

## 2015-05-10 DIAGNOSIS — E1029 Type 1 diabetes mellitus with other diabetic kidney complication: Secondary | ICD-10-CM | POA: Diagnosis not present

## 2015-05-10 DIAGNOSIS — D509 Iron deficiency anemia, unspecified: Secondary | ICD-10-CM | POA: Diagnosis not present

## 2015-05-10 DIAGNOSIS — D631 Anemia in chronic kidney disease: Secondary | ICD-10-CM | POA: Diagnosis not present

## 2015-05-12 DIAGNOSIS — E1029 Type 1 diabetes mellitus with other diabetic kidney complication: Secondary | ICD-10-CM | POA: Diagnosis not present

## 2015-05-12 DIAGNOSIS — D509 Iron deficiency anemia, unspecified: Secondary | ICD-10-CM | POA: Diagnosis not present

## 2015-05-12 DIAGNOSIS — D631 Anemia in chronic kidney disease: Secondary | ICD-10-CM | POA: Diagnosis not present

## 2015-05-12 DIAGNOSIS — N186 End stage renal disease: Secondary | ICD-10-CM | POA: Diagnosis not present

## 2015-05-15 DIAGNOSIS — N186 End stage renal disease: Secondary | ICD-10-CM | POA: Diagnosis not present

## 2015-05-15 DIAGNOSIS — E1029 Type 1 diabetes mellitus with other diabetic kidney complication: Secondary | ICD-10-CM | POA: Diagnosis not present

## 2015-05-15 DIAGNOSIS — D631 Anemia in chronic kidney disease: Secondary | ICD-10-CM | POA: Diagnosis not present

## 2015-05-15 DIAGNOSIS — D509 Iron deficiency anemia, unspecified: Secondary | ICD-10-CM | POA: Diagnosis not present

## 2015-05-17 DIAGNOSIS — D509 Iron deficiency anemia, unspecified: Secondary | ICD-10-CM | POA: Diagnosis not present

## 2015-05-17 DIAGNOSIS — E1029 Type 1 diabetes mellitus with other diabetic kidney complication: Secondary | ICD-10-CM | POA: Diagnosis not present

## 2015-05-17 DIAGNOSIS — D631 Anemia in chronic kidney disease: Secondary | ICD-10-CM | POA: Diagnosis not present

## 2015-05-17 DIAGNOSIS — N186 End stage renal disease: Secondary | ICD-10-CM | POA: Diagnosis not present

## 2015-05-17 DIAGNOSIS — Z23 Encounter for immunization: Secondary | ICD-10-CM | POA: Diagnosis not present

## 2015-05-19 DIAGNOSIS — D631 Anemia in chronic kidney disease: Secondary | ICD-10-CM | POA: Diagnosis not present

## 2015-05-19 DIAGNOSIS — E1029 Type 1 diabetes mellitus with other diabetic kidney complication: Secondary | ICD-10-CM | POA: Diagnosis not present

## 2015-05-19 DIAGNOSIS — N186 End stage renal disease: Secondary | ICD-10-CM | POA: Diagnosis not present

## 2015-05-19 DIAGNOSIS — D509 Iron deficiency anemia, unspecified: Secondary | ICD-10-CM | POA: Diagnosis not present

## 2015-05-22 DIAGNOSIS — E1029 Type 1 diabetes mellitus with other diabetic kidney complication: Secondary | ICD-10-CM | POA: Diagnosis not present

## 2015-05-22 DIAGNOSIS — D631 Anemia in chronic kidney disease: Secondary | ICD-10-CM | POA: Diagnosis not present

## 2015-05-22 DIAGNOSIS — N186 End stage renal disease: Secondary | ICD-10-CM | POA: Diagnosis not present

## 2015-05-22 DIAGNOSIS — D509 Iron deficiency anemia, unspecified: Secondary | ICD-10-CM | POA: Diagnosis not present

## 2015-05-24 DIAGNOSIS — N186 End stage renal disease: Secondary | ICD-10-CM | POA: Diagnosis not present

## 2015-05-24 DIAGNOSIS — D631 Anemia in chronic kidney disease: Secondary | ICD-10-CM | POA: Diagnosis not present

## 2015-05-24 DIAGNOSIS — D509 Iron deficiency anemia, unspecified: Secondary | ICD-10-CM | POA: Diagnosis not present

## 2015-05-24 DIAGNOSIS — E1029 Type 1 diabetes mellitus with other diabetic kidney complication: Secondary | ICD-10-CM | POA: Diagnosis not present

## 2015-05-26 DIAGNOSIS — E1029 Type 1 diabetes mellitus with other diabetic kidney complication: Secondary | ICD-10-CM | POA: Diagnosis not present

## 2015-05-26 DIAGNOSIS — D631 Anemia in chronic kidney disease: Secondary | ICD-10-CM | POA: Diagnosis not present

## 2015-05-26 DIAGNOSIS — D509 Iron deficiency anemia, unspecified: Secondary | ICD-10-CM | POA: Diagnosis not present

## 2015-05-26 DIAGNOSIS — N186 End stage renal disease: Secondary | ICD-10-CM | POA: Diagnosis not present

## 2015-05-28 DIAGNOSIS — N186 End stage renal disease: Secondary | ICD-10-CM | POA: Diagnosis not present

## 2015-05-28 DIAGNOSIS — Z992 Dependence on renal dialysis: Secondary | ICD-10-CM | POA: Diagnosis not present

## 2015-05-28 DIAGNOSIS — T8610 Unspecified complication of kidney transplant: Secondary | ICD-10-CM | POA: Diagnosis not present

## 2015-05-29 DIAGNOSIS — D509 Iron deficiency anemia, unspecified: Secondary | ICD-10-CM | POA: Diagnosis not present

## 2015-05-29 DIAGNOSIS — D631 Anemia in chronic kidney disease: Secondary | ICD-10-CM | POA: Diagnosis not present

## 2015-05-29 DIAGNOSIS — E1029 Type 1 diabetes mellitus with other diabetic kidney complication: Secondary | ICD-10-CM | POA: Diagnosis not present

## 2015-05-29 DIAGNOSIS — N186 End stage renal disease: Secondary | ICD-10-CM | POA: Diagnosis not present

## 2015-05-31 DIAGNOSIS — D509 Iron deficiency anemia, unspecified: Secondary | ICD-10-CM | POA: Diagnosis not present

## 2015-05-31 DIAGNOSIS — D631 Anemia in chronic kidney disease: Secondary | ICD-10-CM | POA: Diagnosis not present

## 2015-05-31 DIAGNOSIS — N186 End stage renal disease: Secondary | ICD-10-CM | POA: Diagnosis not present

## 2015-05-31 DIAGNOSIS — E1029 Type 1 diabetes mellitus with other diabetic kidney complication: Secondary | ICD-10-CM | POA: Diagnosis not present

## 2015-06-02 DIAGNOSIS — N186 End stage renal disease: Secondary | ICD-10-CM | POA: Diagnosis not present

## 2015-06-02 DIAGNOSIS — E1029 Type 1 diabetes mellitus with other diabetic kidney complication: Secondary | ICD-10-CM | POA: Diagnosis not present

## 2015-06-02 DIAGNOSIS — D631 Anemia in chronic kidney disease: Secondary | ICD-10-CM | POA: Diagnosis not present

## 2015-06-02 DIAGNOSIS — D509 Iron deficiency anemia, unspecified: Secondary | ICD-10-CM | POA: Diagnosis not present

## 2015-06-05 DIAGNOSIS — D509 Iron deficiency anemia, unspecified: Secondary | ICD-10-CM | POA: Diagnosis not present

## 2015-06-05 DIAGNOSIS — D631 Anemia in chronic kidney disease: Secondary | ICD-10-CM | POA: Diagnosis not present

## 2015-06-05 DIAGNOSIS — N186 End stage renal disease: Secondary | ICD-10-CM | POA: Diagnosis not present

## 2015-06-05 DIAGNOSIS — E1029 Type 1 diabetes mellitus with other diabetic kidney complication: Secondary | ICD-10-CM | POA: Diagnosis not present

## 2015-06-06 ENCOUNTER — Ambulatory Visit (INDEPENDENT_AMBULATORY_CARE_PROVIDER_SITE_OTHER): Payer: Medicare Other | Admitting: Endocrinology

## 2015-06-06 VITALS — BP 132/87 | HR 76 | Temp 98.4°F | Ht 66.5 in | Wt 224.0 lb

## 2015-06-06 DIAGNOSIS — N189 Chronic kidney disease, unspecified: Secondary | ICD-10-CM | POA: Diagnosis not present

## 2015-06-06 DIAGNOSIS — E1022 Type 1 diabetes mellitus with diabetic chronic kidney disease: Secondary | ICD-10-CM | POA: Diagnosis not present

## 2015-06-06 LAB — POCT GLYCOSYLATED HEMOGLOBIN (HGB A1C): HEMOGLOBIN A1C: 5.8

## 2015-06-06 MED ORDER — REPAGLINIDE 0.5 MG PO TABS: 0.5000 mg | ORAL_TABLET | Freq: Two times a day (BID) | ORAL | Status: DC

## 2015-06-06 NOTE — Progress Notes (Signed)
Subjective:    Patient ID: Patrick [name], male    DOB: 1956-05-28, 59 y.o.   MRN: [id number]  HPI Pt returns for f/u of diabetes mellitus: DM type: 2 Dx'ed: 2014, when he presented with severe hyperglycemia.   Complications: polyneuropathy and renal failure.  Therapy: repaglinide.  DKA: never. Severe hypoglycemia: never. Pancreatitis: never. Other: he took insulin 2014-2016; he had transplant in 2009, but renal failure has recurred.  He is back on dialysis. Interval history: pt states he feels well in general. no cbg record, but states cbg's are well-controlled.  He denies hypoglycemia. Past Medical History  Diagnosis Date  . Hypertension   . Diabetes mellitus without complication   . End-stage renal failure with renal transplant   . Gout   . Impotence of organic origin   . Type I (juvenile type) diabetes mellitus with renal manifestations, not stated as uncontrolled   . Secondary hyperparathyroidism     Past Surgical History  Procedure Laterality Date  . Birth defect heart surgery  1996  . Kidney transplant  2009  . Appendectomy    . Av fistula placement Right 09/18/2014    Procedure: INSERTION OF ARTERIOVENOUS (AV) GORE-TEX GRAFT ARM USING 4-7MM  X 45CM STRETCH GORE-TEX VASCULAR GRAFT;  Surgeon: Rosetta Posner, MD;  Location: [facility] OR;  Service: Vascular;  Laterality: Right;    Social History   Social History  . Marital Status: Married    Spouse Name: N/A  . Number of Children: N/A  . Years of Education: N/A   Occupational History  . Not on file.   Social History Main Topics  . Smoking status: Former Research scientist (life sciences)  . Smokeless tobacco: Not on file  . Alcohol Use: No  . Drug Use: No  . Sexual Activity: Not Currently    Birth Control/ Protection: None   Other Topics Concern  . Not on file   Social History Narrative    Current Outpatient Prescriptions on File Prior to Visit  Medication Sig Dispense Refill  . aspirin 81 MG tablet Take 81 mg by mouth daily.    .  calcitRIOL (ROCALTROL) 0.5 MCG capsule Take 3 capsules (1.5 mcg total) by mouth daily. 90 capsule 0  . carvedilol (COREG) 25 MG tablet Take 25 mg by mouth once.     Marland Kitchen glucose blood (ONE TOUCH ULTRA TEST) test strip Use to test blood glucose 2 times daily as instructed. Dx code: E11.9 100 each 2  . tacrolimus (PROGRAF) 5 MG capsule Take 1 capsule (5 mg total) by mouth 2 (two) times daily. 60 capsule 0  . calcium carbonate (OS-CAL - DOSED IN MG OF ELEMENTAL CALCIUM) 1250 MG tablet Take 2 tablets (1,000 mg of elemental calcium total) by mouth 2 (two) times daily with a meal. (Patient not taking: Reported on 06/06/2015) 120 tablet 0  . Insulin Pen Needle 31G X 5 MM MISC Use to inject insulin 4 times per day. (Patient not taking: Reported on 06/06/2015) 100 each 2  . mycophenolate (CELLCEPT) 250 MG capsule Take 2 capsules (500 mg total) by mouth daily. (Patient not taking: Reported on 03/06/2015) 30 capsule 0  . ONETOUCH DELICA LANCETS 99991111 MISC Use to test blood sugar 2 times daily as instructed. Dx code: 44.41 (Patient not taking: Reported on 06/06/2015) 100 each 2  . oxyCODONE-acetaminophen (PERCOCET/ROXICET) 5-325 MG per tablet Take 1-2 tablets by mouth every 4 (four) hours as needed for moderate pain. (Patient not taking: Reported on 06/06/2015) 30 tablet 0  . potassium  chloride SA (K-DUR,KLOR-CON) 20 MEQ tablet Take 20 mEq by mouth 2 (two) times daily.    . predniSONE (DELTASONE) 5 MG tablet Take 5 mg by mouth daily.    . sodium bicarbonate 325 MG tablet Take 325 mg by mouth 2 (two) times daily.     No current facility-administered medications on file prior to visit.    Allergies  Allergen Reactions  . Iodinated Diagnostic Agents Rash    Other Reaction: burning to mouth, swelling of lips.    Family History  Problem Relation Age of Onset  . Hyperlipidemia Mother   . Hypertension Mother     BP 132/87 mmHg  Pulse 76  Temp(Src) 98.4 F (36.9 C) (Oral)  Ht 5' 6.5" (1.689 m)  Wt 224 lb (101.606  kg)  BMI 35.62 kg/m2  SpO2 92%  Review of Systems No weight change.    Objective:   Physical Exam VITAL SIGNS:  See vs page GENERAL: no distress Pulses: dorsalis pedis intact bilat.   MSK: no deformity of the feet CV: no leg edema Skin:  no ulcer on the feet.  normal color and temp on the feet. Neuro: sensation is intact to touch on the feet   A1c=5.8%    Assessment & Plan:  DM: slightly overcontrolled.    Patient is advised the following: Patient Instructions  Please take the repaglinide to breakfast and supper only.   check your blood sugar once a day.  vary the time of day when you check, between before the 3 meals, and at bedtime.  also check if you have symptoms of your blood sugar being too high or too low.  please keep a record of the readings and bring it to your next appointment here.  You can write it on any piece of paper.  please call us sooner if your blood sugar goes below 70, or if you have a lot of readings over 200. Please come back for a follow-up appointment in 3 months.

## 2015-06-06 NOTE — Patient Instructions (Addendum)
Please take the repaglinide to breakfast and supper only.   check your blood sugar once a day.  vary the time of day when you check, between before the 3 meals, and at bedtime.  also check if you have symptoms of your blood sugar being too high or too low.  please keep a record of the readings and bring it to your next appointment here.  You can write it on any piece of paper.  please call us sooner if your blood sugar goes below 70, or if you have a lot of readings over 200. Please come back for a follow-up appointment in 3 months.

## 2015-06-07 DIAGNOSIS — D631 Anemia in chronic kidney disease: Secondary | ICD-10-CM | POA: Diagnosis not present

## 2015-06-07 DIAGNOSIS — D509 Iron deficiency anemia, unspecified: Secondary | ICD-10-CM | POA: Diagnosis not present

## 2015-06-07 DIAGNOSIS — N186 End stage renal disease: Secondary | ICD-10-CM | POA: Diagnosis not present

## 2015-06-07 DIAGNOSIS — E1029 Type 1 diabetes mellitus with other diabetic kidney complication: Secondary | ICD-10-CM | POA: Diagnosis not present

## 2015-06-09 DIAGNOSIS — E1029 Type 1 diabetes mellitus with other diabetic kidney complication: Secondary | ICD-10-CM | POA: Diagnosis not present

## 2015-06-09 DIAGNOSIS — N186 End stage renal disease: Secondary | ICD-10-CM | POA: Diagnosis not present

## 2015-06-09 DIAGNOSIS — D631 Anemia in chronic kidney disease: Secondary | ICD-10-CM | POA: Diagnosis not present

## 2015-06-09 DIAGNOSIS — D509 Iron deficiency anemia, unspecified: Secondary | ICD-10-CM | POA: Diagnosis not present

## 2015-06-12 DIAGNOSIS — E1029 Type 1 diabetes mellitus with other diabetic kidney complication: Secondary | ICD-10-CM | POA: Diagnosis not present

## 2015-06-12 DIAGNOSIS — N186 End stage renal disease: Secondary | ICD-10-CM | POA: Diagnosis not present

## 2015-06-12 DIAGNOSIS — D509 Iron deficiency anemia, unspecified: Secondary | ICD-10-CM | POA: Diagnosis not present

## 2015-06-12 DIAGNOSIS — D631 Anemia in chronic kidney disease: Secondary | ICD-10-CM | POA: Diagnosis not present

## 2015-06-14 DIAGNOSIS — D631 Anemia in chronic kidney disease: Secondary | ICD-10-CM | POA: Diagnosis not present

## 2015-06-14 DIAGNOSIS — E1029 Type 1 diabetes mellitus with other diabetic kidney complication: Secondary | ICD-10-CM | POA: Diagnosis not present

## 2015-06-14 DIAGNOSIS — N186 End stage renal disease: Secondary | ICD-10-CM | POA: Diagnosis not present

## 2015-06-14 DIAGNOSIS — D509 Iron deficiency anemia, unspecified: Secondary | ICD-10-CM | POA: Diagnosis not present

## 2015-06-16 DIAGNOSIS — D631 Anemia in chronic kidney disease: Secondary | ICD-10-CM | POA: Diagnosis not present

## 2015-06-16 DIAGNOSIS — N186 End stage renal disease: Secondary | ICD-10-CM | POA: Diagnosis not present

## 2015-06-16 DIAGNOSIS — E1029 Type 1 diabetes mellitus with other diabetic kidney complication: Secondary | ICD-10-CM | POA: Diagnosis not present

## 2015-06-16 DIAGNOSIS — D509 Iron deficiency anemia, unspecified: Secondary | ICD-10-CM | POA: Diagnosis not present

## 2015-06-19 DIAGNOSIS — D631 Anemia in chronic kidney disease: Secondary | ICD-10-CM | POA: Diagnosis not present

## 2015-06-19 DIAGNOSIS — N186 End stage renal disease: Secondary | ICD-10-CM | POA: Diagnosis not present

## 2015-06-19 DIAGNOSIS — E1029 Type 1 diabetes mellitus with other diabetic kidney complication: Secondary | ICD-10-CM | POA: Diagnosis not present

## 2015-06-19 DIAGNOSIS — D509 Iron deficiency anemia, unspecified: Secondary | ICD-10-CM | POA: Diagnosis not present

## 2015-06-22 DIAGNOSIS — N186 End stage renal disease: Secondary | ICD-10-CM | POA: Diagnosis not present

## 2015-06-22 DIAGNOSIS — E1029 Type 1 diabetes mellitus with other diabetic kidney complication: Secondary | ICD-10-CM | POA: Diagnosis not present

## 2015-06-22 DIAGNOSIS — D631 Anemia in chronic kidney disease: Secondary | ICD-10-CM | POA: Diagnosis not present

## 2015-06-22 DIAGNOSIS — D509 Iron deficiency anemia, unspecified: Secondary | ICD-10-CM | POA: Diagnosis not present

## 2015-06-24 DIAGNOSIS — D631 Anemia in chronic kidney disease: Secondary | ICD-10-CM | POA: Diagnosis not present

## 2015-06-24 DIAGNOSIS — D509 Iron deficiency anemia, unspecified: Secondary | ICD-10-CM | POA: Diagnosis not present

## 2015-06-24 DIAGNOSIS — N186 End stage renal disease: Secondary | ICD-10-CM | POA: Diagnosis not present

## 2015-06-24 DIAGNOSIS — E1029 Type 1 diabetes mellitus with other diabetic kidney complication: Secondary | ICD-10-CM | POA: Diagnosis not present

## 2015-06-26 DIAGNOSIS — N186 End stage renal disease: Secondary | ICD-10-CM | POA: Diagnosis not present

## 2015-06-26 DIAGNOSIS — E1029 Type 1 diabetes mellitus with other diabetic kidney complication: Secondary | ICD-10-CM | POA: Diagnosis not present

## 2015-06-26 DIAGNOSIS — D509 Iron deficiency anemia, unspecified: Secondary | ICD-10-CM | POA: Diagnosis not present

## 2015-06-26 DIAGNOSIS — D631 Anemia in chronic kidney disease: Secondary | ICD-10-CM | POA: Diagnosis not present

## 2015-06-27 DIAGNOSIS — N186 End stage renal disease: Secondary | ICD-10-CM | POA: Diagnosis not present

## 2015-06-27 DIAGNOSIS — Z992 Dependence on renal dialysis: Secondary | ICD-10-CM | POA: Diagnosis not present

## 2015-06-27 DIAGNOSIS — T8610 Unspecified complication of kidney transplant: Secondary | ICD-10-CM | POA: Diagnosis not present

## 2015-06-28 DIAGNOSIS — E1029 Type 1 diabetes mellitus with other diabetic kidney complication: Secondary | ICD-10-CM | POA: Diagnosis not present

## 2015-06-28 DIAGNOSIS — D631 Anemia in chronic kidney disease: Secondary | ICD-10-CM | POA: Diagnosis not present

## 2015-06-28 DIAGNOSIS — N186 End stage renal disease: Secondary | ICD-10-CM | POA: Diagnosis not present

## 2015-06-30 DIAGNOSIS — D631 Anemia in chronic kidney disease: Secondary | ICD-10-CM | POA: Diagnosis not present

## 2015-06-30 DIAGNOSIS — N186 End stage renal disease: Secondary | ICD-10-CM | POA: Diagnosis not present

## 2015-06-30 DIAGNOSIS — E1029 Type 1 diabetes mellitus with other diabetic kidney complication: Secondary | ICD-10-CM | POA: Diagnosis not present

## 2015-07-03 DIAGNOSIS — D631 Anemia in chronic kidney disease: Secondary | ICD-10-CM | POA: Diagnosis not present

## 2015-07-03 DIAGNOSIS — N186 End stage renal disease: Secondary | ICD-10-CM | POA: Diagnosis not present

## 2015-07-03 DIAGNOSIS — E1029 Type 1 diabetes mellitus with other diabetic kidney complication: Secondary | ICD-10-CM | POA: Diagnosis not present

## 2015-07-05 DIAGNOSIS — N186 End stage renal disease: Secondary | ICD-10-CM | POA: Diagnosis not present

## 2015-07-05 DIAGNOSIS — E1029 Type 1 diabetes mellitus with other diabetic kidney complication: Secondary | ICD-10-CM | POA: Diagnosis not present

## 2015-07-05 DIAGNOSIS — D631 Anemia in chronic kidney disease: Secondary | ICD-10-CM | POA: Diagnosis not present

## 2015-07-07 DIAGNOSIS — D631 Anemia in chronic kidney disease: Secondary | ICD-10-CM | POA: Diagnosis not present

## 2015-07-07 DIAGNOSIS — E1029 Type 1 diabetes mellitus with other diabetic kidney complication: Secondary | ICD-10-CM | POA: Diagnosis not present

## 2015-07-07 DIAGNOSIS — N186 End stage renal disease: Secondary | ICD-10-CM | POA: Diagnosis not present

## 2015-07-10 DIAGNOSIS — N186 End stage renal disease: Secondary | ICD-10-CM | POA: Diagnosis not present

## 2015-07-10 DIAGNOSIS — E1029 Type 1 diabetes mellitus with other diabetic kidney complication: Secondary | ICD-10-CM | POA: Diagnosis not present

## 2015-07-10 DIAGNOSIS — D631 Anemia in chronic kidney disease: Secondary | ICD-10-CM | POA: Diagnosis not present

## 2015-07-12 DIAGNOSIS — N186 End stage renal disease: Secondary | ICD-10-CM | POA: Diagnosis not present

## 2015-07-12 DIAGNOSIS — E1029 Type 1 diabetes mellitus with other diabetic kidney complication: Secondary | ICD-10-CM | POA: Diagnosis not present

## 2015-07-12 DIAGNOSIS — D631 Anemia in chronic kidney disease: Secondary | ICD-10-CM | POA: Diagnosis not present

## 2015-07-14 DIAGNOSIS — D631 Anemia in chronic kidney disease: Secondary | ICD-10-CM | POA: Diagnosis not present

## 2015-07-14 DIAGNOSIS — N186 End stage renal disease: Secondary | ICD-10-CM | POA: Diagnosis not present

## 2015-07-14 DIAGNOSIS — E1029 Type 1 diabetes mellitus with other diabetic kidney complication: Secondary | ICD-10-CM | POA: Diagnosis not present

## 2015-07-17 DIAGNOSIS — D631 Anemia in chronic kidney disease: Secondary | ICD-10-CM | POA: Diagnosis not present

## 2015-07-17 DIAGNOSIS — E1029 Type 1 diabetes mellitus with other diabetic kidney complication: Secondary | ICD-10-CM | POA: Diagnosis not present

## 2015-07-17 DIAGNOSIS — N186 End stage renal disease: Secondary | ICD-10-CM | POA: Diagnosis not present

## 2015-07-20 DIAGNOSIS — N186 End stage renal disease: Secondary | ICD-10-CM | POA: Diagnosis not present

## 2015-07-20 DIAGNOSIS — D631 Anemia in chronic kidney disease: Secondary | ICD-10-CM | POA: Diagnosis not present

## 2015-07-20 DIAGNOSIS — E1029 Type 1 diabetes mellitus with other diabetic kidney complication: Secondary | ICD-10-CM | POA: Diagnosis not present

## 2015-07-21 DIAGNOSIS — D631 Anemia in chronic kidney disease: Secondary | ICD-10-CM | POA: Diagnosis not present

## 2015-07-21 DIAGNOSIS — N186 End stage renal disease: Secondary | ICD-10-CM | POA: Diagnosis not present

## 2015-07-21 DIAGNOSIS — E1029 Type 1 diabetes mellitus with other diabetic kidney complication: Secondary | ICD-10-CM | POA: Diagnosis not present

## 2015-07-24 DIAGNOSIS — D631 Anemia in chronic kidney disease: Secondary | ICD-10-CM | POA: Diagnosis not present

## 2015-07-24 DIAGNOSIS — N186 End stage renal disease: Secondary | ICD-10-CM | POA: Diagnosis not present

## 2015-07-24 DIAGNOSIS — E1029 Type 1 diabetes mellitus with other diabetic kidney complication: Secondary | ICD-10-CM | POA: Diagnosis not present

## 2015-07-26 DIAGNOSIS — D631 Anemia in chronic kidney disease: Secondary | ICD-10-CM | POA: Diagnosis not present

## 2015-07-26 DIAGNOSIS — N186 End stage renal disease: Secondary | ICD-10-CM | POA: Diagnosis not present

## 2015-07-26 DIAGNOSIS — E1029 Type 1 diabetes mellitus with other diabetic kidney complication: Secondary | ICD-10-CM | POA: Diagnosis not present

## 2015-07-28 DIAGNOSIS — Z992 Dependence on renal dialysis: Secondary | ICD-10-CM | POA: Diagnosis not present

## 2015-07-28 DIAGNOSIS — D631 Anemia in chronic kidney disease: Secondary | ICD-10-CM | POA: Diagnosis not present

## 2015-07-28 DIAGNOSIS — N186 End stage renal disease: Secondary | ICD-10-CM | POA: Diagnosis not present

## 2015-07-28 DIAGNOSIS — T8610 Unspecified complication of kidney transplant: Secondary | ICD-10-CM | POA: Diagnosis not present

## 2015-07-28 DIAGNOSIS — E1029 Type 1 diabetes mellitus with other diabetic kidney complication: Secondary | ICD-10-CM | POA: Diagnosis not present

## 2015-07-29 HISTORY — PX: NEPHRECTOMY: SHX65

## 2015-07-29 HISTORY — PX: HERNIA REPAIR: SHX51

## 2015-07-31 DIAGNOSIS — N186 End stage renal disease: Secondary | ICD-10-CM | POA: Diagnosis not present

## 2015-07-31 DIAGNOSIS — D631 Anemia in chronic kidney disease: Secondary | ICD-10-CM | POA: Diagnosis not present

## 2015-07-31 DIAGNOSIS — E1029 Type 1 diabetes mellitus with other diabetic kidney complication: Secondary | ICD-10-CM | POA: Diagnosis not present

## 2015-08-02 DIAGNOSIS — E1029 Type 1 diabetes mellitus with other diabetic kidney complication: Secondary | ICD-10-CM | POA: Diagnosis not present

## 2015-08-02 DIAGNOSIS — N186 End stage renal disease: Secondary | ICD-10-CM | POA: Diagnosis not present

## 2015-08-02 DIAGNOSIS — D631 Anemia in chronic kidney disease: Secondary | ICD-10-CM | POA: Diagnosis not present

## 2015-08-04 DIAGNOSIS — D631 Anemia in chronic kidney disease: Secondary | ICD-10-CM | POA: Diagnosis not present

## 2015-08-04 DIAGNOSIS — E1029 Type 1 diabetes mellitus with other diabetic kidney complication: Secondary | ICD-10-CM | POA: Diagnosis not present

## 2015-08-04 DIAGNOSIS — N186 End stage renal disease: Secondary | ICD-10-CM | POA: Diagnosis not present

## 2015-08-07 DIAGNOSIS — D631 Anemia in chronic kidney disease: Secondary | ICD-10-CM | POA: Diagnosis not present

## 2015-08-07 DIAGNOSIS — N186 End stage renal disease: Secondary | ICD-10-CM | POA: Diagnosis not present

## 2015-08-07 DIAGNOSIS — E1029 Type 1 diabetes mellitus with other diabetic kidney complication: Secondary | ICD-10-CM | POA: Diagnosis not present

## 2015-08-09 DIAGNOSIS — E1029 Type 1 diabetes mellitus with other diabetic kidney complication: Secondary | ICD-10-CM | POA: Diagnosis not present

## 2015-08-09 DIAGNOSIS — N186 End stage renal disease: Secondary | ICD-10-CM | POA: Diagnosis not present

## 2015-08-09 DIAGNOSIS — D631 Anemia in chronic kidney disease: Secondary | ICD-10-CM | POA: Diagnosis not present

## 2015-08-11 DIAGNOSIS — E1029 Type 1 diabetes mellitus with other diabetic kidney complication: Secondary | ICD-10-CM | POA: Diagnosis not present

## 2015-08-11 DIAGNOSIS — N186 End stage renal disease: Secondary | ICD-10-CM | POA: Diagnosis not present

## 2015-08-11 DIAGNOSIS — D631 Anemia in chronic kidney disease: Secondary | ICD-10-CM | POA: Diagnosis not present

## 2015-08-14 DIAGNOSIS — D631 Anemia in chronic kidney disease: Secondary | ICD-10-CM | POA: Diagnosis not present

## 2015-08-14 DIAGNOSIS — N186 End stage renal disease: Secondary | ICD-10-CM | POA: Diagnosis not present

## 2015-08-14 DIAGNOSIS — E1029 Type 1 diabetes mellitus with other diabetic kidney complication: Secondary | ICD-10-CM | POA: Diagnosis not present

## 2015-08-16 DIAGNOSIS — E1029 Type 1 diabetes mellitus with other diabetic kidney complication: Secondary | ICD-10-CM | POA: Diagnosis not present

## 2015-08-16 DIAGNOSIS — N186 End stage renal disease: Secondary | ICD-10-CM | POA: Diagnosis not present

## 2015-08-16 DIAGNOSIS — D631 Anemia in chronic kidney disease: Secondary | ICD-10-CM | POA: Diagnosis not present

## 2015-08-18 DIAGNOSIS — N186 End stage renal disease: Secondary | ICD-10-CM | POA: Diagnosis not present

## 2015-08-18 DIAGNOSIS — E1029 Type 1 diabetes mellitus with other diabetic kidney complication: Secondary | ICD-10-CM | POA: Diagnosis not present

## 2015-08-18 DIAGNOSIS — D631 Anemia in chronic kidney disease: Secondary | ICD-10-CM | POA: Diagnosis not present

## 2015-08-21 DIAGNOSIS — D631 Anemia in chronic kidney disease: Secondary | ICD-10-CM | POA: Diagnosis not present

## 2015-08-21 DIAGNOSIS — N186 End stage renal disease: Secondary | ICD-10-CM | POA: Diagnosis not present

## 2015-08-21 DIAGNOSIS — E1029 Type 1 diabetes mellitus with other diabetic kidney complication: Secondary | ICD-10-CM | POA: Diagnosis not present

## 2015-08-23 DIAGNOSIS — N186 End stage renal disease: Secondary | ICD-10-CM | POA: Diagnosis not present

## 2015-08-23 DIAGNOSIS — E1029 Type 1 diabetes mellitus with other diabetic kidney complication: Secondary | ICD-10-CM | POA: Diagnosis not present

## 2015-08-23 DIAGNOSIS — D631 Anemia in chronic kidney disease: Secondary | ICD-10-CM | POA: Diagnosis not present

## 2015-08-25 DIAGNOSIS — N186 End stage renal disease: Secondary | ICD-10-CM | POA: Diagnosis not present

## 2015-08-25 DIAGNOSIS — D631 Anemia in chronic kidney disease: Secondary | ICD-10-CM | POA: Diagnosis not present

## 2015-08-25 DIAGNOSIS — E1029 Type 1 diabetes mellitus with other diabetic kidney complication: Secondary | ICD-10-CM | POA: Diagnosis not present

## 2015-08-28 DIAGNOSIS — N186 End stage renal disease: Secondary | ICD-10-CM | POA: Diagnosis not present

## 2015-08-28 DIAGNOSIS — D631 Anemia in chronic kidney disease: Secondary | ICD-10-CM | POA: Diagnosis not present

## 2015-08-28 DIAGNOSIS — Z992 Dependence on renal dialysis: Secondary | ICD-10-CM | POA: Diagnosis not present

## 2015-08-28 DIAGNOSIS — E1029 Type 1 diabetes mellitus with other diabetic kidney complication: Secondary | ICD-10-CM | POA: Diagnosis not present

## 2015-08-28 DIAGNOSIS — T8610 Unspecified complication of kidney transplant: Secondary | ICD-10-CM | POA: Diagnosis not present

## 2015-08-30 DIAGNOSIS — Z23 Encounter for immunization: Secondary | ICD-10-CM | POA: Diagnosis not present

## 2015-08-30 DIAGNOSIS — N186 End stage renal disease: Secondary | ICD-10-CM | POA: Diagnosis not present

## 2015-08-30 DIAGNOSIS — E1029 Type 1 diabetes mellitus with other diabetic kidney complication: Secondary | ICD-10-CM | POA: Diagnosis not present

## 2015-08-30 DIAGNOSIS — D631 Anemia in chronic kidney disease: Secondary | ICD-10-CM | POA: Diagnosis not present

## 2015-09-01 DIAGNOSIS — D631 Anemia in chronic kidney disease: Secondary | ICD-10-CM | POA: Diagnosis not present

## 2015-09-01 DIAGNOSIS — Z23 Encounter for immunization: Secondary | ICD-10-CM | POA: Diagnosis not present

## 2015-09-01 DIAGNOSIS — E1029 Type 1 diabetes mellitus with other diabetic kidney complication: Secondary | ICD-10-CM | POA: Diagnosis not present

## 2015-09-01 DIAGNOSIS — N186 End stage renal disease: Secondary | ICD-10-CM | POA: Diagnosis not present

## 2015-09-02 ENCOUNTER — Encounter (HOSPITAL_COMMUNITY): Payer: Self-pay

## 2015-09-02 ENCOUNTER — Emergency Department (EMERGENCY_DEPARTMENT_HOSPITAL)
Admit: 2015-09-02 | Discharge: 2015-09-02 | Disposition: A | Discharge status: Home / Self Care | Payer: Medicare Other | Attending: Emergency Medicine | Admitting: Emergency Medicine

## 2015-09-02 ENCOUNTER — Emergency Department (HOSPITAL_COMMUNITY)
Admission: EM | Admit: 2015-09-02 | Discharge: 2015-09-02 | Disposition: A | Discharge status: Home / Self Care | Payer: Medicare Other | Attending: Emergency Medicine | Admitting: Emergency Medicine

## 2015-09-02 DIAGNOSIS — N186 End stage renal disease: Secondary | ICD-10-CM | POA: Insufficient documentation

## 2015-09-02 DIAGNOSIS — Z9889 Other specified postprocedural states: Secondary | ICD-10-CM | POA: Insufficient documentation

## 2015-09-02 DIAGNOSIS — M79601 Pain in right arm: Secondary | ICD-10-CM | POA: Diagnosis not present

## 2015-09-02 DIAGNOSIS — Z992 Dependence on renal dialysis: Secondary | ICD-10-CM | POA: Diagnosis not present

## 2015-09-02 DIAGNOSIS — Z7982 Long term (current) use of aspirin: Secondary | ICD-10-CM | POA: Insufficient documentation

## 2015-09-02 DIAGNOSIS — E119 Type 2 diabetes mellitus without complications: Secondary | ICD-10-CM | POA: Insufficient documentation

## 2015-09-02 DIAGNOSIS — M79639 Pain in unspecified forearm: Secondary | ICD-10-CM | POA: Diagnosis not present

## 2015-09-02 DIAGNOSIS — Z87891 Personal history of nicotine dependence: Secondary | ICD-10-CM | POA: Diagnosis not present

## 2015-09-02 DIAGNOSIS — R52 Pain, unspecified: Secondary | ICD-10-CM | POA: Diagnosis not present

## 2015-09-02 DIAGNOSIS — Z87438 Personal history of other diseases of male genital organs: Secondary | ICD-10-CM | POA: Diagnosis not present

## 2015-09-02 DIAGNOSIS — Z79899 Other long term (current) drug therapy: Secondary | ICD-10-CM | POA: Diagnosis not present

## 2015-09-02 DIAGNOSIS — M25529 Pain in unspecified elbow: Secondary | ICD-10-CM | POA: Diagnosis not present

## 2015-09-02 DIAGNOSIS — Z7952 Long term (current) use of systemic steroids: Secondary | ICD-10-CM | POA: Insufficient documentation

## 2015-09-02 DIAGNOSIS — I120 Hypertensive chronic kidney disease with stage 5 chronic kidney disease or end stage renal disease: Secondary | ICD-10-CM | POA: Insufficient documentation

## 2015-09-02 DIAGNOSIS — M79621 Pain in right upper arm: Secondary | ICD-10-CM | POA: Diagnosis not present

## 2015-09-02 DIAGNOSIS — M25521 Pain in right elbow: Secondary | ICD-10-CM | POA: Diagnosis not present

## 2015-09-02 DIAGNOSIS — M79631 Pain in right forearm: Secondary | ICD-10-CM | POA: Diagnosis not present

## 2015-09-02 LAB — BASIC METABOLIC PANEL
Anion gap: 15 (ref 5–15)
BUN: 36 mg/dL — AB (ref 6–20)
CHLORIDE: 97 mmol/L — AB (ref 101–111)
CO2: 26 mmol/L (ref 22–32)
CREATININE: 10.6 mg/dL — AB (ref 0.61–1.24)
Calcium: 7.6 mg/dL — ABNORMAL LOW (ref 8.9–10.3)
GFR calc non Af Amer: 5 mL/min — ABNORMAL LOW (ref >60)
GFR, EST AFRICAN AMERICAN: 5 mL/min — AB (ref >60)
Glucose, Bld: 89 mg/dL (ref 65–99)
Potassium: 4.3 mmol/L (ref 3.5–5.1)
Sodium: 138 mmol/L (ref 135–145)

## 2015-09-02 LAB — CBC WITH DIFFERENTIAL/PLATELET
Basophils Absolute: 0.1 10*3/uL (ref 0.0–0.1)
Basophils Relative: 1 %
Eosinophils Absolute: 0.3 10*3/uL (ref 0.0–0.7)
Eosinophils Relative: 5 %
HEMATOCRIT: 33 % — AB (ref 39.0–52.0)
Hemoglobin: 10.3 g/dL — ABNORMAL LOW (ref 13.0–17.0)
LYMPHS ABS: 1.9 10*3/uL (ref 0.7–4.0)
Lymphocytes Relative: 41 %
MCH: 31 pg (ref 26.0–34.0)
MCHC: 31.2 g/dL (ref 30.0–36.0)
MCV: 99.4 fL (ref 78.0–100.0)
MONOS PCT: 8 %
Monocytes Absolute: 0.4 10*3/uL (ref 0.1–1.0)
NEUTROS PCT: 45 %
Neutro Abs: 2.1 10*3/uL (ref 1.7–7.7)
Platelets: 143 10*3/uL — ABNORMAL LOW (ref 150–400)
RBC: 3.32 MIL/uL — AB (ref 4.22–5.81)
RDW: 17.3 % — ABNORMAL HIGH (ref 11.5–15.5)
WBC: 4.6 10*3/uL (ref 4.0–10.5)

## 2015-09-02 MED ORDER — HYDROCODONE-ACETAMINOPHEN 5-325 MG PO TABS: 1.0000 | ORAL_TABLET | ORAL | Status: DC | PRN

## 2015-09-02 MED ORDER — HYDROCODONE-ACETAMINOPHEN 5-325 MG PO TABS
2.0000 | ORAL_TABLET | Freq: Once | ORAL | Status: AC
Start: 2015-09-02 — End: 2015-09-02
  Administered 2015-09-02: 2 via ORAL
  Filled 2015-09-02: qty 2

## 2015-09-02 NOTE — Consult Note (Signed)
VASCULAR [facility] VEIN SPECIALISTS OF [place] HISTORY AND PHYSICAL   Referring Physician: Dr Lacinda Axon Wassim [place] History of Present Illness:  Patient is a 60 y.o. male who presents for evaluation of right arm pain and swelling.  Pt has old right forearm and functioning right upper arm currently.  Developed pain and swelling in right arm dorsal forearm lateral elbow after dialysis on Thursday.  Denies fever or chills.  No recent problems with graft.  Flow was good on HD per pt.  Other medical problems include hypertension, DM, Gout all of which have been stable.  Past Medical History  Diagnosis Date  . Hypertension   . Diabetes mellitus without complication ([place])   . End-stage renal failure with renal transplant ([place])   . Gout   . Impotence of organic origin   . Type I (juvenile type) diabetes mellitus with renal manifestations, not stated as uncontrolled   . Secondary hyperparathyroidism [facility])     Past Surgical History  Procedure Laterality Date  . Birth defect heart surgery  1996  . Kidney transplant  2009  . Appendectomy    . Av fistula placement Right 09/18/2014    Procedure: INSERTION OF ARTERIOVENOUS (AV) GORE-TEX GRAFT ARM USING 4-7MM  X 45CM STRETCH GORE-TEX VASCULAR GRAFT;  Surgeon: Rosetta Posner, MD;  Location: [facility] OR;  Service: Vascular;  Laterality: Right;    Social History Social History  Substance Use Topics  . Smoking status: Former Research scientist (life sciences)  . Smokeless tobacco: None  . Alcohol Use: No    Family History Family History  Problem Relation Age of Onset  . Hyperlipidemia Mother   . Hypertension Mother     Allergies  Allergies  Allergen Reactions  . Iodinated Diagnostic Agents Rash    Other Reaction: burning to mouth, swelling of lips.     No current facility-administered medications for this encounter.   Current Outpatient Prescriptions  Medication Sig Dispense Refill  . aspirin 81 MG tablet Take 81 mg by mouth daily.    . calcitRIOL (ROCALTROL) 0.5 MCG  capsule Take 3 capsules (1.5 mcg total) by mouth daily. 90 capsule 0  . carvedilol (COREG) 25 MG tablet Take 25 mg by mouth daily.     . predniSONE (DELTASONE) 5 MG tablet Take 5 mg by mouth daily.    . repaglinide (PRANDIN) 0.5 MG tablet Take 1 tablet (0.5 mg total) by mouth 2 (two) times daily before a meal. 60 tablet 11  . glucose blood (ONE TOUCH ULTRA TEST) test strip Use to test blood glucose 2 times daily as instructed. Dx code: E11.9 (Patient not taking: Reported on 09/02/2015) 100 each 2  . Insulin Pen Needle 31G X 5 MM MISC Use to inject insulin 4 times per day. (Patient not taking: Reported on 06/06/2015) 100 each 2  . mycophenolate (CELLCEPT) 250 MG capsule Take 2 capsules (500 mg total) by mouth daily. (Patient not taking: Reported on 03/06/2015) 30 capsule 0  . ONETOUCH DELICA LANCETS 99991111 MISC Use to test blood sugar 2 times daily as instructed. Dx code: 53.41 (Patient not taking: Reported on 06/06/2015) 100 each 2  . tacrolimus (PROGRAF) 5 MG capsule Take 1 capsule (5 mg total) by mouth 2 (two) times daily. (Patient not taking: Reported on 09/02/2015) 60 capsule 0    ROS:   General:  No weight loss, Fever, chills  HEENT: No recent headaches, no nasal bleeding, no visual changes, no sore throat  Neurologic: No dizziness, blackouts, seizures. No recent symptoms of stroke  or mini- stroke. No recent episodes of slurred speech, or temporary blindness.  Cardiac: No recent episodes of chest pain/pressure, no shortness of breath at rest.  No shortness of breath with exertion.  Denies history of atrial fibrillation or irregular heartbeat  Vascular: No history of rest pain in feet.  No history of claudication.  No history of non-healing ulcer, No history of DVT   Pulmonary: No home oxygen, no productive cough, no hemoptysis,  No asthma or wheezing  Musculoskeletal:  [ ]  Arthritis, [ ]  Low back pain,  [x ] Joint pain  Hematologic:No history of hypercoagulable state.  No history of easy  bleeding.  No history of anemia  Gastrointestinal: No hematochezia or melena,  No gastroesophageal reflux, no trouble swallowing  Urinary: [x ] chronic Kidney disease, [ ]  on HD - [ ]  MWF or [x ] TTHS, [ ]  Burning with urination, [ ]  Frequent urination, [ ]  Difficulty urinating;   Skin: No rashes  Psychological: No history of anxiety,  No history of depression   Physical Examination  Filed Vitals:   09/02/15 0800 09/02/15 0830 09/02/15 0900 09/02/15 1026  BP: 150/93 137/92 138/84 133/84  Pulse: 81 72 79 71  Temp:      TempSrc:      Resp:    18  Height:      Weight:      SpO2: 98% 94% 97% 97%    Body mass index is 35.08 kg/(m^2).  General:  Alert and oriented, no acute distress HEENT: Normal Pulmonary: Clear to auscultation bilaterally Cardiac: Regular Rate and Rhythm Abdomen: Soft, non-tender, non-distended, no mass Skin: No rash or erythema Extremity Pulses:  Thrombosed forearm graft patent upper arm graft no drainage or erythema Musculoskeletal: No deformity trace forearm edema right arm with tenderness lateral elbow and dorsal forearm  Neurologic: Upper and lower extremity motor 5/5 and symmetric  DATA:  Duplex graft patent without surrounding fluid, no venous thrombus   ASSESSMENT:  Pain right arm no evidence of graft infection or thrombosis.  May have had infiltration at last session   PLAN:  1. Arm elevation 2. Ice 3 pain meds for a few days.  Follow up with [place] in 2 weeks if not resolved  Ruta Hinds, MD [facility] of [place]: [phone] Pager: [phone]

## 2015-09-02 NOTE — ED Notes (Signed)
MD at the bedside  

## 2015-09-02 NOTE — Progress Notes (Signed)
VASCULAR LAB PRELIMINARY  PRELIMINARY  PRELIMINARY  PRELIMINARY  Right upper extremity duplex of AV graft completed.    Preliminary report:  Dialysis access appears WNL.  No perigraft fluid noted.  There is no SVT noted in the cephalic vein that runs along site of pain and swelling.  Jariana Shumard, RVT 09/02/2015, 9:31 AM

## 2015-09-02 NOTE — ED Notes (Signed)
Pt returned from Vascular. 

## 2015-09-02 NOTE — ED Notes (Signed)
Seen by vascular surgery

## 2015-09-02 NOTE — ED Notes (Signed)
Per Pt, Pt is coming from home with a Hx of dialysis. Pt reports having an access test on right arm of graft on Thursday with success. After test, pt started to have pain in the lower right arm that continued. On Saturday, pt was able to receive treatment without issue, but continued to have pain in the right arm. Reports having trouble moving arm and slight edema noted.

## 2015-09-02 NOTE — Discharge Instructions (Signed)
Muscle Pain, Adult Keep arm elevated and apply ice to painful area. Take pain medicine as prescribed. Follow-up with her doctor. Return to ED if you develop worsening symptoms. Muscle pain (myalgia) may be caused by many things, including:  Overuse or muscle strain, especially if you are not in shape. This is the most common cause of muscle pain.  Injury.  Bruises.  Viruses, such as the flu.  Infectious diseases.  Fibromyalgia, which is a chronic condition that causes muscle tenderness, fatigue, and headache.  Autoimmune diseases, including lupus.  Certain drugs, including ACE inhibitors and statins. Muscle pain may be mild or severe. In most cases, the pain lasts only a short time and goes away without treatment. To diagnose the cause of your muscle pain, your health care provider will take your medical history. This means he or she will ask you when your muscle pain began and what has been happening. If you have not had muscle pain for very long, your health care provider may want to wait before doing much testing. If your muscle pain has lasted a long time, your health care provider may want to run tests right away. If your health care provider thinks your muscle pain may be caused by illness, you may need to have additional tests to rule out certain conditions.  Treatment for muscle pain depends on the cause. Home care is often enough to relieve muscle pain. Your health care provider may also prescribe anti-inflammatory medicine. HOME CARE INSTRUCTIONS Watch your condition for any changes. The following actions may help to lessen any discomfort you are feeling:  Only take over-the-counter or prescription medicines as directed by your health care provider.  Apply ice to the sore muscle:  Put ice in a plastic bag.  Place a towel between your skin and the bag.  Leave the ice on for 15-20 minutes, 3-4 times a day.  You may alternate applying hot and cold packs to the muscle as  directed by your health care provider.  If overuse is causing your muscle pain, slow down your activities until the pain goes away.  Remember that it is normal to feel some muscle pain after starting a workout program. Muscles that have not been used often will be sore at first.  Do regular, gentle exercises if you are not usually active.  Warm up before exercising to lower your risk of muscle pain.  Do not continue working out if the pain is very bad. Bad pain could mean you have injured a muscle. SEEK MEDICAL CARE IF:  Your muscle pain gets worse, and medicines do not help.  You have muscle pain that lasts longer than 3 days.  You have a rash or fever along with muscle pain.  You have muscle pain after a tick bite.  You have muscle pain while working out, even though you are in good physical condition.  You have redness, soreness, or swelling along with muscle pain.  You have muscle pain after starting a new medicine or changing the dose of a medicine. SEEK IMMEDIATE MEDICAL CARE IF:  You have trouble breathing.  You have trouble swallowing.  You have muscle pain along with a stiff neck, fever, and vomiting.  You have severe muscle weakness or cannot move part of your body. MAKE SURE YOU:   Understand these instructions.  Will watch your condition.  Will get help right away if you are not doing well or get worse.   This information is not intended to replace  advice given to you by your health care provider. Make sure you discuss any questions you have with your health care provider.   Document Released: 06/05/2006 Document Revised: 08/04/2014 Document Reviewed: 05/10/2013 Elsevier Interactive Patient Education [employer].

## 2015-09-02 NOTE — ED Notes (Signed)
Phlebotomy at the bedside  

## 2015-09-02 NOTE — ED Provider Notes (Signed)
CSN: [id number]     Arrival date & time 09/02/15  0708 History   First MD Initiated Contact with Patient 09/02/15 0715     Chief Complaint  Patient presents with  . Vascular Access Problem  . Arm Pain     (Consider location/radiation/quality/duration/timing/severity/associated sxs/prior Treatment) HPI Comments: Patient presents with pain to his right arm ongoing for the past 3 days. States started after "access test" of his right arm graft on Thursday for dialysis. He underwent his dialysis session normally but the pain in his arm progressed over the next 2 days. It is constant and radiating from his elbow down to his wrist. It is worse with movement and palpation. He is reduced range of motion of his wrist and elbow. Denies any falls or injuries. Denies any fever. Denies any chest pain or shortness of breath. He underwent dialysis yesterday without complication.ro ..ro. History of renal transplant 2009 which has since failed. Has been back on dialysis for the past one year. He was told he may have had infiltrate from the access test. Had no problems with this graft prior to this test.  The history is provided by the patient.    Past Medical History  Diagnosis Date  . Hypertension   . Diabetes mellitus without complication ([place])   . End-stage renal failure with renal transplant ([place])   . Gout   . Impotence of organic origin   . Type I (juvenile type) diabetes mellitus with renal manifestations, not stated as uncontrolled   . Secondary hyperparathyroidism [facility])    Past Surgical History  Procedure Laterality Date  . Birth defect heart surgery  1996  . Kidney transplant  2009  . Appendectomy    . Av fistula placement Right 09/18/2014    Procedure: INSERTION OF ARTERIOVENOUS (AV) GORE-TEX GRAFT ARM USING 4-7MM  X 45CM STRETCH GORE-TEX VASCULAR GRAFT;  Surgeon: Rosetta Posner, MD;  Location: [facility] OR;  Service: Vascular;  Laterality: Right;   Family History  Problem Relation Age of Onset  .  Hyperlipidemia Mother   . Hypertension Mother    Social History  Substance Use Topics  . Smoking status: Former Research scientist (life sciences)  . Smokeless tobacco: None  . Alcohol Use: No    Review of Systems  Constitutional: Negative for fever, activity change and appetite change.  HENT: Negative for congestion and rhinorrhea.   Respiratory: Negative for cough, chest tightness and shortness of breath.   Cardiovascular: Negative for chest pain.  Gastrointestinal: Negative for nausea, vomiting and abdominal pain.  Genitourinary: Negative for dysuria and hematuria.  Musculoskeletal: Positive for myalgias and arthralgias.  Skin: Negative for wound.  Neurological: Negative for dizziness, weakness and headaches.    A complete 10 system review of systems was obtained and all systems are negative except as noted in the HPI and PMH.    Allergies  Iodinated diagnostic agents  Home Medications   Prior to Admission medications   Medication Sig Start Date End Date Taking? Authorizing Provider  aspirin 81 MG tablet Take 81 mg by mouth daily.   Yes Historical Provider, MD  calcitRIOL (ROCALTROL) 0.5 MCG capsule Take 3 capsules (1.5 mcg total) by mouth daily. 11/30/13  Yes Modena Jansky, MD  carvedilol (COREG) 25 MG tablet Take 25 mg by mouth daily.    Yes Historical Provider, MD  predniSONE (DELTASONE) 5 MG tablet Take 5 mg by mouth daily.   Yes Historical Provider, MD  repaglinide (PRANDIN) 0.5 MG tablet Take 1 tablet (0.5 mg total)  by mouth 2 (two) times daily before a meal. 06/06/15  Yes Renato Shin, MD  glucose blood (ONE TOUCH ULTRA TEST) test strip Use to test blood glucose 2 times daily as instructed. Dx code: E11.9 Patient not taking: Reported on 09/02/2015 11/09/14   Renato Shin, MD  HYDROcodone-acetaminophen (NORCO/VICODIN) 5-325 MG tablet Take 1 tablet by mouth every 4 (four) hours as needed. 09/02/15   Ezequiel Essex, MD  Insulin Pen Needle 31G X 5 MM MISC Use to inject insulin 4 times per day. Patient  not taking: Reported on 06/06/2015 08/01/14   Renato Shin, MD  mycophenolate (CELLCEPT) 250 MG capsule Take 2 capsules (500 mg total) by mouth daily. Patient not taking: Reported on 03/06/2015 09/19/14   Reyne Dumas, MD  [facility] DELICA LANCETS [id number] MISC Use to test blood sugar 2 times daily as instructed. Dx code: 55.41 Patient not taking: Reported on 06/06/2015 01/11/14   Renato Shin, MD  tacrolimus (PROGRAF) 5 MG capsule Take 1 capsule (5 mg total) by mouth 2 (two) times daily. Patient not taking: Reported on 09/02/2015 09/19/14   Reyne Dumas, MD   BP 124/92 mmHg  Pulse 64  Temp(Src) 97.9 F (36.6 C) (Oral)  Resp 18  Ht 5\' 7"  (1.702 m)  Wt 224 lb (101.606 kg)  BMI 35.08 kg/m2  SpO2 99% Physical Exam  Constitutional: He is oriented to person, place, and time. He appears well-developed and well-nourished. No distress.  HENT:  Head: Normocephalic and atraumatic.  Mouth/Throat: Oropharynx is clear and moist. No oropharyngeal exudate.  Eyes: Conjunctivae and EOM are normal. Pupils are equal, round, and reactive to light.  Neck: Normal range of motion. Neck supple.  No meningismus.  Cardiovascular: Normal rate, regular rhythm, normal heart sounds and intact distal pulses.   No murmur heard. Pulmonary/Chest: Effort normal and breath sounds normal. No respiratory distress.  Abdominal: Soft. There is no tenderness. There is no rebound and no guarding.  Soft ventral hernia  Musculoskeletal: Normal range of motion. He exhibits edema and tenderness.  Dialysis graft to left upper extremity with intact thrill and bruit. There is no erythema or fluctuance. Tenderness palpation of dorsal forearm and lateral elbow with reduced range of motion of elbow. Radial pulse difficult to palpate but strong by Doppler. Cardinal hand movements intact.  Neurological: He is alert and oriented to person, place, and time. No cranial nerve deficit. He exhibits normal muscle tone. Coordination normal.  No ataxia on  finger to nose bilaterally. No pronator drift. 5/5 strength throughout. CN 2-12 intact.Equal grip strength. Sensation intact.   Skin: Skin is warm.  Psychiatric: He has a normal mood and affect. His behavior is normal.  Nursing note and vitals reviewed.   ED Course  Procedures (including critical care time) Labs Review Labs Reviewed  CBC WITH DIFFERENTIAL/PLATELET - Abnormal; Notable for the following:    RBC 3.32 (*)    Hemoglobin 10.3 (*)    HCT 33.0 (*)    RDW 17.3 (*)    Platelets 143 (*)    All other components within normal limits  BASIC METABOLIC PANEL - Abnormal; Notable for the following:    Chloride 97 (*)    BUN 36 (*)    Creatinine, Ser 10.60 (*)    Calcium 7.6 (*)    GFR calc non Af Amer 5 (*)    GFR calc Af Amer 5 (*)    All other components within normal limits    Imaging Review No results found. I have personally reviewed  and evaluated these images and lab results as part of my medical decision-making.   EKG Interpretation None      MDM   Final diagnoses:  Pain  Pain of right upper extremity  Pain to RUE distal to dialysis access after patency test.  Intact thrill and bruit.  Distal radial pulse intact. Denies trauma. No erythema or fluctuance. Neurovascular intact distally. No fever. White blood cell count is normal.  Ultrasound shows patent AV graft. There is no free fluid no evidence of DVT.  Dr. Oneida Alar has seen patient. He agrees there is no evidence of infection. He states there is likely infiltration during dialysis session. Recommends ice, elevation, pain control.  Discussed with patient. He has better range of motion of his elbow and wrist at this time. He states his pain is under control. He will be discharged for follow-up with vascular surgery this week. Return precautions discussed.  Ezequiel Essex, MD 09/02/15 [phone]

## 2015-09-04 DIAGNOSIS — E1029 Type 1 diabetes mellitus with other diabetic kidney complication: Secondary | ICD-10-CM | POA: Diagnosis not present

## 2015-09-04 DIAGNOSIS — D631 Anemia in chronic kidney disease: Secondary | ICD-10-CM | POA: Diagnosis not present

## 2015-09-04 DIAGNOSIS — Z23 Encounter for immunization: Secondary | ICD-10-CM | POA: Diagnosis not present

## 2015-09-04 DIAGNOSIS — N186 End stage renal disease: Secondary | ICD-10-CM | POA: Diagnosis not present

## 2015-09-06 DIAGNOSIS — N186 End stage renal disease: Secondary | ICD-10-CM | POA: Diagnosis not present

## 2015-09-06 DIAGNOSIS — D631 Anemia in chronic kidney disease: Secondary | ICD-10-CM | POA: Diagnosis not present

## 2015-09-06 DIAGNOSIS — Z23 Encounter for immunization: Secondary | ICD-10-CM | POA: Diagnosis not present

## 2015-09-06 DIAGNOSIS — E1029 Type 1 diabetes mellitus with other diabetic kidney complication: Secondary | ICD-10-CM | POA: Diagnosis not present

## 2015-09-08 DIAGNOSIS — Z23 Encounter for immunization: Secondary | ICD-10-CM | POA: Diagnosis not present

## 2015-09-08 DIAGNOSIS — D631 Anemia in chronic kidney disease: Secondary | ICD-10-CM | POA: Diagnosis not present

## 2015-09-08 DIAGNOSIS — N186 End stage renal disease: Secondary | ICD-10-CM | POA: Diagnosis not present

## 2015-09-08 DIAGNOSIS — E1029 Type 1 diabetes mellitus with other diabetic kidney complication: Secondary | ICD-10-CM | POA: Diagnosis not present

## 2015-09-11 DIAGNOSIS — D631 Anemia in chronic kidney disease: Secondary | ICD-10-CM | POA: Diagnosis not present

## 2015-09-11 DIAGNOSIS — N186 End stage renal disease: Secondary | ICD-10-CM | POA: Diagnosis not present

## 2015-09-11 DIAGNOSIS — Z23 Encounter for immunization: Secondary | ICD-10-CM | POA: Diagnosis not present

## 2015-09-11 DIAGNOSIS — E1029 Type 1 diabetes mellitus with other diabetic kidney complication: Secondary | ICD-10-CM | POA: Diagnosis not present

## 2015-09-13 DIAGNOSIS — Z23 Encounter for immunization: Secondary | ICD-10-CM | POA: Diagnosis not present

## 2015-09-13 DIAGNOSIS — N186 End stage renal disease: Secondary | ICD-10-CM | POA: Diagnosis not present

## 2015-09-13 DIAGNOSIS — E1029 Type 1 diabetes mellitus with other diabetic kidney complication: Secondary | ICD-10-CM | POA: Diagnosis not present

## 2015-09-13 DIAGNOSIS — D631 Anemia in chronic kidney disease: Secondary | ICD-10-CM | POA: Diagnosis not present

## 2015-09-15 DIAGNOSIS — D631 Anemia in chronic kidney disease: Secondary | ICD-10-CM | POA: Diagnosis not present

## 2015-09-15 DIAGNOSIS — N186 End stage renal disease: Secondary | ICD-10-CM | POA: Diagnosis not present

## 2015-09-15 DIAGNOSIS — Z23 Encounter for immunization: Secondary | ICD-10-CM | POA: Diagnosis not present

## 2015-09-15 DIAGNOSIS — E1029 Type 1 diabetes mellitus with other diabetic kidney complication: Secondary | ICD-10-CM | POA: Diagnosis not present

## 2015-09-18 DIAGNOSIS — D631 Anemia in chronic kidney disease: Secondary | ICD-10-CM | POA: Diagnosis not present

## 2015-09-18 DIAGNOSIS — N186 End stage renal disease: Secondary | ICD-10-CM | POA: Diagnosis not present

## 2015-09-18 DIAGNOSIS — Z23 Encounter for immunization: Secondary | ICD-10-CM | POA: Diagnosis not present

## 2015-09-18 DIAGNOSIS — E1029 Type 1 diabetes mellitus with other diabetic kidney complication: Secondary | ICD-10-CM | POA: Diagnosis not present

## 2015-09-20 DIAGNOSIS — D631 Anemia in chronic kidney disease: Secondary | ICD-10-CM | POA: Diagnosis not present

## 2015-09-20 DIAGNOSIS — Z23 Encounter for immunization: Secondary | ICD-10-CM | POA: Diagnosis not present

## 2015-09-20 DIAGNOSIS — N186 End stage renal disease: Secondary | ICD-10-CM | POA: Diagnosis not present

## 2015-09-20 DIAGNOSIS — E1029 Type 1 diabetes mellitus with other diabetic kidney complication: Secondary | ICD-10-CM | POA: Diagnosis not present

## 2015-09-22 DIAGNOSIS — E1029 Type 1 diabetes mellitus with other diabetic kidney complication: Secondary | ICD-10-CM | POA: Diagnosis not present

## 2015-09-22 DIAGNOSIS — N186 End stage renal disease: Secondary | ICD-10-CM | POA: Diagnosis not present

## 2015-09-22 DIAGNOSIS — D631 Anemia in chronic kidney disease: Secondary | ICD-10-CM | POA: Diagnosis not present

## 2015-09-22 DIAGNOSIS — Z23 Encounter for immunization: Secondary | ICD-10-CM | POA: Diagnosis not present

## 2015-09-25 DIAGNOSIS — Z23 Encounter for immunization: Secondary | ICD-10-CM | POA: Diagnosis not present

## 2015-09-25 DIAGNOSIS — N186 End stage renal disease: Secondary | ICD-10-CM | POA: Diagnosis not present

## 2015-09-25 DIAGNOSIS — T8610 Unspecified complication of kidney transplant: Secondary | ICD-10-CM | POA: Diagnosis not present

## 2015-09-25 DIAGNOSIS — E1029 Type 1 diabetes mellitus with other diabetic kidney complication: Secondary | ICD-10-CM | POA: Diagnosis not present

## 2015-09-25 DIAGNOSIS — Z992 Dependence on renal dialysis: Secondary | ICD-10-CM | POA: Diagnosis not present

## 2015-09-25 DIAGNOSIS — D631 Anemia in chronic kidney disease: Secondary | ICD-10-CM | POA: Diagnosis not present

## 2015-09-27 DIAGNOSIS — N186 End stage renal disease: Secondary | ICD-10-CM | POA: Diagnosis not present

## 2015-09-27 DIAGNOSIS — D509 Iron deficiency anemia, unspecified: Secondary | ICD-10-CM | POA: Diagnosis not present

## 2015-09-27 DIAGNOSIS — E1029 Type 1 diabetes mellitus with other diabetic kidney complication: Secondary | ICD-10-CM | POA: Diagnosis not present

## 2015-09-27 DIAGNOSIS — D631 Anemia in chronic kidney disease: Secondary | ICD-10-CM | POA: Diagnosis not present

## 2015-09-27 DIAGNOSIS — N2581 Secondary hyperparathyroidism of renal origin: Secondary | ICD-10-CM | POA: Diagnosis not present

## 2015-09-29 DIAGNOSIS — J069 Acute upper respiratory infection, unspecified: Secondary | ICD-10-CM | POA: Diagnosis not present

## 2015-09-29 DIAGNOSIS — D631 Anemia in chronic kidney disease: Secondary | ICD-10-CM | POA: Diagnosis not present

## 2015-09-29 DIAGNOSIS — D509 Iron deficiency anemia, unspecified: Secondary | ICD-10-CM | POA: Diagnosis not present

## 2015-09-29 DIAGNOSIS — N186 End stage renal disease: Secondary | ICD-10-CM | POA: Diagnosis not present

## 2015-09-29 DIAGNOSIS — E1029 Type 1 diabetes mellitus with other diabetic kidney complication: Secondary | ICD-10-CM | POA: Diagnosis not present

## 2015-09-29 DIAGNOSIS — N2581 Secondary hyperparathyroidism of renal origin: Secondary | ICD-10-CM | POA: Diagnosis not present

## 2015-10-01 ENCOUNTER — Encounter: Payer: Self-pay | Admitting: Vascular Surgery

## 2015-10-02 DIAGNOSIS — D631 Anemia in chronic kidney disease: Secondary | ICD-10-CM | POA: Diagnosis not present

## 2015-10-02 DIAGNOSIS — E1029 Type 1 diabetes mellitus with other diabetic kidney complication: Secondary | ICD-10-CM | POA: Diagnosis not present

## 2015-10-02 DIAGNOSIS — D509 Iron deficiency anemia, unspecified: Secondary | ICD-10-CM | POA: Diagnosis not present

## 2015-10-02 DIAGNOSIS — N2581 Secondary hyperparathyroidism of renal origin: Secondary | ICD-10-CM | POA: Diagnosis not present

## 2015-10-02 DIAGNOSIS — N186 End stage renal disease: Secondary | ICD-10-CM | POA: Diagnosis not present

## 2015-10-03 ENCOUNTER — Encounter (HOSPITAL_COMMUNITY): Payer: Self-pay | Admitting: Emergency Medicine

## 2015-10-03 ENCOUNTER — Emergency Department (HOSPITAL_COMMUNITY): Payer: Medicare Other

## 2015-10-03 ENCOUNTER — Ambulatory Visit: Payer: Medicare Other | Admitting: Endocrinology

## 2015-10-03 ENCOUNTER — Emergency Department (HOSPITAL_COMMUNITY)
Admission: EM | Admit: 2015-10-03 | Discharge: 2015-10-03 | Disposition: A | Discharge status: Home / Self Care | Payer: Medicare Other | Attending: Emergency Medicine | Admitting: Emergency Medicine

## 2015-10-03 DIAGNOSIS — R05 Cough: Secondary | ICD-10-CM | POA: Insufficient documentation

## 2015-10-03 DIAGNOSIS — Z7952 Long term (current) use of systemic steroids: Secondary | ICD-10-CM | POA: Insufficient documentation

## 2015-10-03 DIAGNOSIS — J111 Influenza due to unidentified influenza virus with other respiratory manifestations: Secondary | ICD-10-CM | POA: Diagnosis not present

## 2015-10-03 DIAGNOSIS — R0981 Nasal congestion: Secondary | ICD-10-CM | POA: Insufficient documentation

## 2015-10-03 DIAGNOSIS — Z794 Long term (current) use of insulin: Secondary | ICD-10-CM | POA: Insufficient documentation

## 2015-10-03 DIAGNOSIS — Z79899 Other long term (current) drug therapy: Secondary | ICD-10-CM | POA: Diagnosis not present

## 2015-10-03 DIAGNOSIS — M791 Myalgia: Secondary | ICD-10-CM | POA: Insufficient documentation

## 2015-10-03 DIAGNOSIS — I120 Hypertensive chronic kidney disease with stage 5 chronic kidney disease or end stage renal disease: Secondary | ICD-10-CM | POA: Insufficient documentation

## 2015-10-03 DIAGNOSIS — E109 Type 1 diabetes mellitus without complications: Secondary | ICD-10-CM | POA: Insufficient documentation

## 2015-10-03 DIAGNOSIS — M7981 Nontraumatic hematoma of soft tissue: Secondary | ICD-10-CM | POA: Diagnosis not present

## 2015-10-03 DIAGNOSIS — T8619 Other complication of kidney transplant: Secondary | ICD-10-CM

## 2015-10-03 DIAGNOSIS — N186 End stage renal disease: Secondary | ICD-10-CM | POA: Insufficient documentation

## 2015-10-03 DIAGNOSIS — Z940 Kidney transplant status: Secondary | ICD-10-CM | POA: Diagnosis not present

## 2015-10-03 DIAGNOSIS — Z7982 Long term (current) use of aspirin: Secondary | ICD-10-CM | POA: Diagnosis not present

## 2015-10-03 DIAGNOSIS — Z87891 Personal history of nicotine dependence: Secondary | ICD-10-CM | POA: Diagnosis not present

## 2015-10-03 DIAGNOSIS — Z87448 Personal history of other diseases of urinary system: Secondary | ICD-10-CM | POA: Insufficient documentation

## 2015-10-03 DIAGNOSIS — R509 Fever, unspecified: Secondary | ICD-10-CM | POA: Diagnosis not present

## 2015-10-03 DIAGNOSIS — R6889 Other general symptoms and signs: Secondary | ICD-10-CM

## 2015-10-03 LAB — CBC WITH DIFFERENTIAL/PLATELET
BASOS PCT: 1 %
Basophils Absolute: 0 10*3/uL (ref 0.0–0.1)
EOS ABS: 0.4 10*3/uL (ref 0.0–0.7)
Eosinophils Relative: 6 %
HCT: 35.2 % — ABNORMAL LOW (ref 39.0–52.0)
Hemoglobin: 11.3 g/dL — ABNORMAL LOW (ref 13.0–17.0)
LYMPHS ABS: 2.6 10*3/uL (ref 0.7–4.0)
Lymphocytes Relative: 46 %
MCH: 30.2 pg (ref 26.0–34.0)
MCHC: 32.1 g/dL (ref 30.0–36.0)
MCV: 94.1 fL (ref 78.0–100.0)
MONOS PCT: 8 %
Monocytes Absolute: 0.5 10*3/uL (ref 0.1–1.0)
NEUTROS ABS: 2.2 10*3/uL (ref 1.7–7.7)
NEUTROS PCT: 39 %
Platelets: 184 10*3/uL (ref 150–400)
RBC: 3.74 MIL/uL — AB (ref 4.22–5.81)
RDW: 17.7 % — ABNORMAL HIGH (ref 11.5–15.5)
WBC: 5.6 10*3/uL (ref 4.0–10.5)

## 2015-10-03 LAB — BASIC METABOLIC PANEL
Anion gap: 19 — ABNORMAL HIGH (ref 5–15)
BUN: 34 mg/dL — ABNORMAL HIGH (ref 6–20)
CHLORIDE: 94 mmol/L — AB (ref 101–111)
CO2: 22 mmol/L (ref 22–32)
Calcium: 7.5 mg/dL — ABNORMAL LOW (ref 8.9–10.3)
Creatinine, Ser: 12.37 mg/dL — ABNORMAL HIGH (ref 0.61–1.24)
GFR calc non Af Amer: 4 mL/min — ABNORMAL LOW (ref >60)
GFR, EST AFRICAN AMERICAN: 4 mL/min — AB (ref >60)
Glucose, Bld: 89 mg/dL (ref 65–99)
POTASSIUM: 4 mmol/L (ref 3.5–5.1)
SODIUM: 135 mmol/L (ref 135–145)

## 2015-10-03 MED ORDER — HYDROCODONE-ACETAMINOPHEN 5-325 MG PO TABS: 1.0000 | ORAL_TABLET | Freq: Four times a day (QID) | ORAL | Status: DC | PRN

## 2015-10-03 MED ORDER — IPRATROPIUM-ALBUTEROL 0.5-2.5 (3) MG/3ML IN SOLN
3.0000 mL | Freq: Once | RESPIRATORY_TRACT | Status: AC
  Administered 2015-10-03: 3 mL via RESPIRATORY_TRACT
  Filled 2015-10-03: qty 3

## 2015-10-03 MED ORDER — ALBUTEROL SULFATE HFA 108 (90 BASE) MCG/ACT IN AERS
2.0000 | INHALATION_SPRAY | Freq: Four times a day (QID) | RESPIRATORY_TRACT | Status: DC
  Administered 2015-10-03: 2 via RESPIRATORY_TRACT
  Filled 2015-10-03: qty 6.7

## 2015-10-03 NOTE — ED Notes (Addendum)
Pt. reports persistent dry cough , chest congestion , generalized weakness/body aches , fever and chills onset last week . Hemodialysis q Tues/Thurs/Sat.

## 2015-10-03 NOTE — ED Provider Notes (Signed)
CSN: [id number]     Arrival date & time 10/03/15  [id number] History   First MD Initiated Contact with Patient 10/03/15 0901     Chief Complaint  Patient presents with  . Cough  . Generalized Body Aches  . Fever     (Consider location/radiation/quality/duration/timing/severity/associated sxs/prior Treatment) Patient is a 60 y.o. male presenting with cough and fever. The history is provided by the patient.  Cough Associated symptoms: fever and myalgias   Associated symptoms: no chest pain, no headaches, no rash and no shortness of breath   Fever Associated symptoms: congestion, cough and myalgias   Associated symptoms: no chest pain, no confusion, no dysuria, no headaches, no nausea and no rash   Patient with onset of flulike illness on Thursday. Had fevers and chills over the weekend. Felt a little better yesterday was dialyzed on Tuesday. His normal scheduled Tuesday Thursday and Saturday. Patient the started feeling bad again today. Persistent dry cough is his main complaint has chest congestion. No nausea vomiting or diarrhea associated with bodyaches.  Past Medical History  Diagnosis Date  . Hypertension   . Diabetes mellitus without complication ([place])   . End-stage renal failure with renal transplant ([place])   . Gout   . Impotence of organic origin   . Type I (juvenile type) diabetes mellitus with renal manifestations, not stated as uncontrolled   . Secondary hyperparathyroidism [facility])    Past Surgical History  Procedure Laterality Date  . Birth defect heart surgery  1996  . Kidney transplant  2009  . Appendectomy    . Av fistula placement Right 09/18/2014    Procedure: INSERTION OF ARTERIOVENOUS (AV) GORE-TEX GRAFT ARM USING 4-7MM  X 45CM STRETCH GORE-TEX VASCULAR GRAFT;  Surgeon: Rosetta Posner, MD;  Location: [facility] OR;  Service: Vascular;  Laterality: Right;   Family History  Problem Relation Age of Onset  . Hyperlipidemia Mother   . Hypertension Mother    Social History   Substance Use Topics  . Smoking status: Former Research scientist (life sciences)  . Smokeless tobacco: None  . Alcohol Use: No    Review of Systems  Constitutional: Positive for fever and fatigue.  HENT: Positive for congestion.   Eyes: Negative for redness.  Respiratory: Positive for cough. Negative for shortness of breath.   Cardiovascular: Negative for chest pain.  Gastrointestinal: Negative for nausea and abdominal pain.  Genitourinary: Negative for dysuria.  Musculoskeletal: Positive for myalgias.  Skin: Negative for rash.  Neurological: Negative for headaches.  Hematological: Bruises/bleeds easily.  Psychiatric/Behavioral: Negative for confusion.      Allergies  Iodinated diagnostic agents  Home Medications   Prior to Admission medications   Medication Sig Start Date End Date Taking? Authorizing Provider  aspirin 81 MG tablet Take 81 mg by mouth daily.   Yes Historical Provider, MD  calcitRIOL (ROCALTROL) 0.5 MCG capsule Take 3 capsules (1.5 mcg total) by mouth daily. Patient taking differently: Take 1 mcg by mouth daily.  11/30/13  Yes Modena Jansky, MD  glucose blood (ONE TOUCH ULTRA TEST) test strip Use to test blood glucose 2 times daily as instructed. Dx code: E11.9 11/09/14  Yes Renato Shin, MD  Insulin Pen Needle 31G X 5 MM MISC Use to inject insulin 4 times per day. 08/01/14  Yes Renato Shin, MD  predniSONE (DELTASONE) 5 MG tablet Take 5 mg by mouth daily.   Yes Historical Provider, MD  repaglinide (PRANDIN) 0.5 MG tablet Take 1 tablet (0.5 mg total) by mouth 2 (two) times  daily before a meal. 06/06/15  Yes Renato Shin, MD  HYDROcodone-acetaminophen (NORCO/VICODIN) 5-325 MG tablet Take 1 tablet by mouth every 4 (four) hours as needed. Patient not taking: Reported on 10/03/2015 09/02/15   Ezequiel Essex, MD  HYDROcodone-acetaminophen (NORCO/VICODIN) 5-325 MG tablet Take 1-2 tablets by mouth every 6 (six) hours as needed for moderate pain. 10/03/15   Fredia Sorrow, MD  mycophenolate  (CELLCEPT) 250 MG capsule Take 2 capsules (500 mg total) by mouth daily. Patient not taking: Reported on 03/06/2015 09/19/14   Reyne Dumas, MD  [facility] DELICA LANCETS [id number] MISC Use to test blood sugar 2 times daily as instructed. Dx code: 33.41 Patient not taking: Reported on 06/06/2015 01/11/14   Renato Shin, MD  tacrolimus (PROGRAF) 5 MG capsule Take 1 capsule (5 mg total) by mouth 2 (two) times daily. Patient not taking: Reported on 09/02/2015 09/19/14   Reyne Dumas, MD   BP 141/96 mmHg  Pulse 85  Temp(Src) 99.6 F (37.6 C) (Oral)  Resp 21  Ht 5\' 6"  (1.676 m)  Wt 102.059 kg  BMI 36.33 kg/m2  SpO2 95% Physical Exam  Constitutional: He is oriented to person, place, and time. He appears well-developed and well-nourished. No distress.  HENT:  Head: Normocephalic and atraumatic.  Mouth/Throat: Oropharynx is clear and moist.  Eyes: Conjunctivae and EOM are normal. Pupils are equal, round, and reactive to light.  Neck: Normal range of motion. Neck supple.  Cardiovascular: Normal rate, regular rhythm and normal heart sounds.   No murmur heard. Pulmonary/Chest: Effort normal and breath sounds normal.  Abdominal: Soft. Bowel sounds are normal. There is no tenderness.  Musculoskeletal: Normal range of motion.  Neurological: He is alert and oriented to person, place, and time. No cranial nerve deficit. He exhibits normal muscle tone. Coordination normal.  Skin: Skin is warm.  Nursing note and vitals reviewed.   ED Course  Procedures (including critical care time) Labs Review Labs Reviewed  CBC WITH DIFFERENTIAL/PLATELET - Abnormal; Notable for the following:    RBC 3.74 (*)    Hemoglobin 11.3 (*)    HCT 35.2 (*)    RDW 17.7 (*)    All other components within normal limits  BASIC METABOLIC PANEL - Abnormal; Notable for the following:    Chloride 94 (*)    BUN 34 (*)    Creatinine, Ser 12.37 (*)    Calcium 7.5 (*)    GFR calc non Af Amer 4 (*)    GFR calc Af Amer 4 (*)    Anion gap  19 (*)    All other components within normal limits   Results for orders placed or performed during the hospital encounter of 10/03/15  CBC with Differential  Result Value Ref Range   WBC 5.6 4.0 - 10.5 K/uL   RBC 3.74 (L) 4.22 - 5.81 MIL/uL   Hemoglobin 11.3 (L) 13.0 - 17.0 g/dL   HCT 35.2 (L) 39.0 - 52.0 %   MCV 94.1 78.0 - 100.0 fL   MCH 30.2 26.0 - 34.0 pg   MCHC 32.1 30.0 - 36.0 g/dL   RDW 17.7 (H) 11.5 - 15.5 %   Platelets 184 150 - 400 K/uL   Neutrophils Relative % 39 %   Neutro Abs 2.2 1.7 - 7.7 K/uL   Lymphocytes Relative 46 %   Lymphs Abs 2.6 0.7 - 4.0 K/uL   Monocytes Relative 8 %   Monocytes Absolute 0.5 0.1 - 1.0 K/uL   Eosinophils Relative 6 %   Eosinophils  Absolute 0.4 0.0 - 0.7 K/uL   Basophils Relative 1 %   Basophils Absolute 0.0 0.0 - 0.1 K/uL  Basic metabolic panel  Result Value Ref Range   Sodium 135 135 - 145 mmol/L   Potassium 4.0 3.5 - 5.1 mmol/L   Chloride 94 (L) 101 - 111 mmol/L   CO2 22 22 - 32 mmol/L   Glucose, Bld 89 65 - 99 mg/dL   BUN 34 (H) 6 - 20 mg/dL   Creatinine, Ser 12.37 (H) 0.61 - 1.24 mg/dL   Calcium 7.5 (L) 8.9 - 10.3 mg/dL   GFR calc non Af Amer 4 (L) >60 mL/min   GFR calc Af Amer 4 (L) >60 mL/min   Anion gap 19 (H) 5 - 15     Imaging Review Dg Chest 2 View  10/03/2015  CLINICAL DATA:  Persistent dry cough.  Chest congestion, fever. EXAM: CHEST  2 VIEW COMPARISON:  09/14/2014 FINDINGS: Prior CABG. Cardiomegaly. No confluent opacities, effusions or edema. No acute bony abnormality. IMPRESSION: Cardiomegaly.  No active disease. Electronically Signed   By: Rolm Baptise M.D.   On: 10/03/2015 07:50   I have personally reviewed and evaluated these images and lab results as part of my medical decision-making.   EKG Interpretation None      MDM   Final diagnoses:  End-stage renal failure with renal transplant ([place])  Flu-like symptoms   Patient wasn't doing any wheezing but significant improvement with the use of an albuterol  nebulizer treatment here. We'll continue albuterol for the next 7 days at home. Patient normally dialyzed Tuesdays Thursdays and Saturdays. Patient was dialyzed yesterday. Chest x-ray negative for pneumonia. Labs without significant abnormalities. Patient's symptoms consistent with a flulike illness. Patient nontoxic. Symptoms started almost a week ago. So patient is not a candidate for Tamiflu.    Fredia Sorrow, MD 10/03/15 1226

## 2015-10-03 NOTE — ED Notes (Signed)
Pt stating that he doesn't feel well and needs to lay down. Pt vitals rechecked and within normal limits. Pt made aware of wait time.

## 2015-10-03 NOTE — Discharge Instructions (Signed)
Continue her dialysis schedule. Use albuterol inhaler 2 puffs every 6 hours for the next 7 days. Take hydrocodone as needed for the bodyaches. Return for any new or worse symptoms.

## 2015-10-04 DIAGNOSIS — E1029 Type 1 diabetes mellitus with other diabetic kidney complication: Secondary | ICD-10-CM | POA: Diagnosis not present

## 2015-10-04 DIAGNOSIS — N2581 Secondary hyperparathyroidism of renal origin: Secondary | ICD-10-CM | POA: Diagnosis not present

## 2015-10-04 DIAGNOSIS — D631 Anemia in chronic kidney disease: Secondary | ICD-10-CM | POA: Diagnosis not present

## 2015-10-04 DIAGNOSIS — D509 Iron deficiency anemia, unspecified: Secondary | ICD-10-CM | POA: Diagnosis not present

## 2015-10-04 DIAGNOSIS — N186 End stage renal disease: Secondary | ICD-10-CM | POA: Diagnosis not present

## 2015-10-05 ENCOUNTER — Ambulatory Visit: Payer: Medicare Other | Admitting: Vascular Surgery

## 2015-10-06 DIAGNOSIS — N2581 Secondary hyperparathyroidism of renal origin: Secondary | ICD-10-CM | POA: Diagnosis not present

## 2015-10-06 DIAGNOSIS — D509 Iron deficiency anemia, unspecified: Secondary | ICD-10-CM | POA: Diagnosis not present

## 2015-10-06 DIAGNOSIS — N186 End stage renal disease: Secondary | ICD-10-CM | POA: Diagnosis not present

## 2015-10-06 DIAGNOSIS — E1029 Type 1 diabetes mellitus with other diabetic kidney complication: Secondary | ICD-10-CM | POA: Diagnosis not present

## 2015-10-06 DIAGNOSIS — D631 Anemia in chronic kidney disease: Secondary | ICD-10-CM | POA: Diagnosis not present

## 2015-10-09 DIAGNOSIS — E1029 Type 1 diabetes mellitus with other diabetic kidney complication: Secondary | ICD-10-CM | POA: Diagnosis not present

## 2015-10-09 DIAGNOSIS — N186 End stage renal disease: Secondary | ICD-10-CM | POA: Diagnosis not present

## 2015-10-09 DIAGNOSIS — D631 Anemia in chronic kidney disease: Secondary | ICD-10-CM | POA: Diagnosis not present

## 2015-10-09 DIAGNOSIS — N2581 Secondary hyperparathyroidism of renal origin: Secondary | ICD-10-CM | POA: Diagnosis not present

## 2015-10-09 DIAGNOSIS — D509 Iron deficiency anemia, unspecified: Secondary | ICD-10-CM | POA: Diagnosis not present

## 2015-10-11 ENCOUNTER — Emergency Department (HOSPITAL_BASED_OUTPATIENT_CLINIC_OR_DEPARTMENT_OTHER)
Admit: 2015-10-11 | Discharge: 2015-10-11 | Disposition: A | Discharge status: Home / Self Care | Payer: Medicare Other | Attending: Emergency Medicine | Admitting: Emergency Medicine

## 2015-10-11 ENCOUNTER — Encounter (HOSPITAL_COMMUNITY): Payer: Self-pay | Admitting: Emergency Medicine

## 2015-10-11 ENCOUNTER — Emergency Department (HOSPITAL_BASED_OUTPATIENT_CLINIC_OR_DEPARTMENT_OTHER): Admit: 2015-10-11 | Discharge: 2015-10-11 | Disposition: A | Discharge status: Home / Self Care | Payer: Medicare Other

## 2015-10-11 ENCOUNTER — Inpatient Hospital Stay (HOSPITAL_COMMUNITY)
Admission: EM | Admit: 2015-10-11 | Discharge: 2015-10-14 | DRG: 194 | Disposition: A | Discharge status: Home / Self Care | Payer: Medicare Other | Attending: Internal Medicine | Admitting: Internal Medicine

## 2015-10-11 ENCOUNTER — Emergency Department (HOSPITAL_COMMUNITY): Payer: Medicare Other

## 2015-10-11 DIAGNOSIS — Z87891 Personal history of nicotine dependence: Secondary | ICD-10-CM | POA: Diagnosis not present

## 2015-10-11 DIAGNOSIS — T8612 Kidney transplant failure: Secondary | ICD-10-CM | POA: Diagnosis not present

## 2015-10-11 DIAGNOSIS — E1122 Type 2 diabetes mellitus with diabetic chronic kidney disease: Secondary | ICD-10-CM | POA: Diagnosis not present

## 2015-10-11 DIAGNOSIS — Y830 Surgical operation with transplant of whole organ as the cause of abnormal reaction of the patient, or of later complication, without mention of misadventure at the time of the procedure: Secondary | ICD-10-CM | POA: Diagnosis present

## 2015-10-11 DIAGNOSIS — D631 Anemia in chronic kidney disease: Secondary | ICD-10-CM | POA: Diagnosis not present

## 2015-10-11 DIAGNOSIS — R52 Pain, unspecified: Secondary | ICD-10-CM

## 2015-10-11 DIAGNOSIS — R609 Edema, unspecified: Secondary | ICD-10-CM

## 2015-10-11 DIAGNOSIS — I120 Hypertensive chronic kidney disease with stage 5 chronic kidney disease or end stage renal disease: Secondary | ICD-10-CM | POA: Diagnosis not present

## 2015-10-11 DIAGNOSIS — M79641 Pain in right hand: Secondary | ICD-10-CM | POA: Diagnosis not present

## 2015-10-11 DIAGNOSIS — R05 Cough: Secondary | ICD-10-CM | POA: Diagnosis not present

## 2015-10-11 DIAGNOSIS — N186 End stage renal disease: Secondary | ICD-10-CM | POA: Diagnosis not present

## 2015-10-11 DIAGNOSIS — N189 Chronic kidney disease, unspecified: Secondary | ICD-10-CM

## 2015-10-11 DIAGNOSIS — M7989 Other specified soft tissue disorders: Secondary | ICD-10-CM

## 2015-10-11 DIAGNOSIS — M109 Gout, unspecified: Secondary | ICD-10-CM | POA: Diagnosis not present

## 2015-10-11 DIAGNOSIS — Z940 Kidney transplant status: Secondary | ICD-10-CM | POA: Diagnosis not present

## 2015-10-11 DIAGNOSIS — I10 Essential (primary) hypertension: Secondary | ICD-10-CM | POA: Diagnosis present

## 2015-10-11 DIAGNOSIS — M10031 Idiopathic gout, right wrist: Secondary | ICD-10-CM | POA: Diagnosis not present

## 2015-10-11 DIAGNOSIS — J069 Acute upper respiratory infection, unspecified: Secondary | ICD-10-CM | POA: Diagnosis present

## 2015-10-11 DIAGNOSIS — Z992 Dependence on renal dialysis: Secondary | ICD-10-CM

## 2015-10-11 DIAGNOSIS — T8619 Other complication of kidney transplant: Secondary | ICD-10-CM

## 2015-10-11 DIAGNOSIS — M1000 Idiopathic gout, unspecified site: Secondary | ICD-10-CM | POA: Diagnosis not present

## 2015-10-11 DIAGNOSIS — J189 Pneumonia, unspecified organism: Secondary | ICD-10-CM | POA: Diagnosis not present

## 2015-10-11 DIAGNOSIS — E119 Type 2 diabetes mellitus without complications: Secondary | ICD-10-CM

## 2015-10-11 LAB — RENAL FUNCTION PANEL
Albumin: 2.7 g/dL — ABNORMAL LOW (ref 3.5–5.0)
Anion gap: 16 — ABNORMAL HIGH (ref 5–15)
BUN: 46 mg/dL — ABNORMAL HIGH (ref 6–20)
CO2: 22 mmol/L (ref 22–32)
Calcium: 6.5 mg/dL — ABNORMAL LOW (ref 8.9–10.3)
Chloride: 95 mmol/L — ABNORMAL LOW (ref 101–111)
Creatinine, Ser: 16.35 mg/dL — ABNORMAL HIGH (ref 0.61–1.24)
GFR calc Af Amer: 3 mL/min — ABNORMAL LOW (ref >60)
GFR calc non Af Amer: 3 mL/min — ABNORMAL LOW (ref >60)
Glucose, Bld: 101 mg/dL — ABNORMAL HIGH (ref 65–99)
Phosphorus: 5.2 mg/dL — ABNORMAL HIGH (ref 2.5–4.6)
Potassium: 3.7 mmol/L (ref 3.5–5.1)
Sodium: 133 mmol/L — ABNORMAL LOW (ref 135–145)

## 2015-10-11 LAB — CBC WITH DIFFERENTIAL/PLATELET
Basophils Absolute: 0 10*3/uL (ref 0.0–0.1)
Basophils Relative: 1 %
Eosinophils Absolute: 0.2 10*3/uL (ref 0.0–0.7)
Eosinophils Relative: 4 %
HEMATOCRIT: 35.3 % — AB (ref 39.0–52.0)
HEMOGLOBIN: 11.3 g/dL — AB (ref 13.0–17.0)
Lymphocytes Relative: 23 %
Lymphs Abs: 1.3 10*3/uL (ref 0.7–4.0)
MCH: 29.7 pg (ref 26.0–34.0)
MCHC: 32 g/dL (ref 30.0–36.0)
MCV: 92.9 fL (ref 78.0–100.0)
MONOS PCT: 9 %
Monocytes Absolute: 0.5 10*3/uL (ref 0.1–1.0)
NEUTROS ABS: 3.7 10*3/uL (ref 1.7–7.7)
NEUTROS PCT: 63 %
Platelets: 202 10*3/uL (ref 150–400)
RBC: 3.8 MIL/uL — ABNORMAL LOW (ref 4.22–5.81)
RDW: 17.7 % — ABNORMAL HIGH (ref 11.5–15.5)
WBC: 5.7 10*3/uL (ref 4.0–10.5)

## 2015-10-11 LAB — I-STAT CHEM 8, ED
BUN: 41 mg/dL — AB (ref 6–20)
CALCIUM ION: 0.72 mmol/L — AB (ref 1.13–1.30)
CHLORIDE: 94 mmol/L — AB (ref 101–111)
Creatinine, Ser: 16.3 mg/dL — ABNORMAL HIGH (ref 0.61–1.24)
GLUCOSE: 92 mg/dL (ref 65–99)
HCT: 38 % — ABNORMAL LOW (ref 39.0–52.0)
Hemoglobin: 12.9 g/dL — ABNORMAL LOW (ref 13.0–17.0)
Potassium: 3.5 mmol/L (ref 3.5–5.1)
Sodium: 135 mmol/L (ref 135–145)
TCO2: 24 mmol/L (ref 0–100)

## 2015-10-11 LAB — CBC
HCT: 28.6 % — ABNORMAL LOW (ref 39.0–52.0)
Hemoglobin: 8.8 g/dL — ABNORMAL LOW (ref 13.0–17.0)
MCH: 28.4 pg (ref 26.0–34.0)
MCHC: 30.8 g/dL (ref 30.0–36.0)
MCV: 92.3 fL (ref 78.0–100.0)
Platelets: 248 10*3/uL (ref 150–400)
RBC: 3.1 MIL/uL — ABNORMAL LOW (ref 4.22–5.81)
RDW: 17.6 % — ABNORMAL HIGH (ref 11.5–15.5)
WBC: 6.7 K/uL (ref 4.0–10.5)

## 2015-10-11 LAB — URIC ACID: Uric Acid, Serum: 7.8 mg/dL — ABNORMAL HIGH (ref 4.4–7.6)

## 2015-10-11 LAB — CBG MONITORING, ED: GLUCOSE-CAPILLARY: 98 mg/dL (ref 65–99)

## 2015-10-11 LAB — GLUCOSE, CAPILLARY: GLUCOSE-CAPILLARY: 171 mg/dL — AB (ref 65–99)

## 2015-10-11 MED ORDER — VANCOMYCIN HCL 10 G IV SOLR
2250.0000 mg | Freq: Once | INTRAVENOUS | Status: DC
  Filled 2015-10-11: qty 2250

## 2015-10-11 MED ORDER — DOXERCALCIFEROL 4 MCG/2ML IV SOLN
2.0000 ug | INTRAVENOUS | Status: DC
  Administered 2015-10-13: 2 ug via INTRAVENOUS

## 2015-10-11 MED ORDER — PREDNISONE 50 MG PO TABS
50.0000 mg | ORAL_TABLET | Freq: Every day | ORAL | Status: DC
  Administered 2015-10-11 – 2015-10-14 (×4): 50 mg via ORAL
  Filled 2015-10-11: qty 1
  Filled 2015-10-11: qty 3
  Filled 2015-10-11 (×2): qty 1

## 2015-10-11 MED ORDER — DEXTROSE 5 % IV SOLN: 500.0000 mg | Freq: Once | INTRAVENOUS | Status: DC

## 2015-10-11 MED ORDER — DEXTROSE 5 % IV SOLN: 1.0000 g | Freq: Once | INTRAVENOUS | Status: DC

## 2015-10-11 MED ORDER — VANCOMYCIN HCL 10 G IV SOLR
2000.0000 mg | Freq: Once | INTRAVENOUS | Status: AC
  Administered 2015-10-11: 2000 mg via INTRAVENOUS
  Filled 2015-10-11: qty 2000

## 2015-10-11 MED ORDER — MORPHINE SULFATE (PF) 2 MG/ML IV SOLN: 1.0000 mg | INTRAVENOUS | Status: DC | PRN

## 2015-10-11 MED ORDER — HEPARIN SODIUM (PORCINE) 5000 UNIT/ML IJ SOLN
5000.0000 [IU] | Freq: Three times a day (TID) | INTRAMUSCULAR | Status: DC
  Administered 2015-10-11 – 2015-10-12 (×3): 5000 [IU] via SUBCUTANEOUS
  Filled 2015-10-11 (×4): qty 1

## 2015-10-11 MED ORDER — INSULIN ASPART 100 UNIT/ML ~~LOC~~ SOLN: 0.0000 [IU] | Freq: Every day | SUBCUTANEOUS | Status: DC

## 2015-10-11 MED ORDER — ACETAMINOPHEN 325 MG PO TABS
650.0000 mg | ORAL_TABLET | Freq: Four times a day (QID) | ORAL | Status: DC | PRN
  Administered 2015-10-11 – 2015-10-12 (×2): 650 mg via ORAL
  Filled 2015-10-11 (×2): qty 2

## 2015-10-11 MED ORDER — CARVEDILOL 25 MG PO TABS
25.0000 mg | ORAL_TABLET | Freq: Two times a day (BID) | ORAL | Status: DC
  Administered 2015-10-12 (×2): 25 mg via ORAL
  Filled 2015-10-11 (×2): qty 1

## 2015-10-11 MED ORDER — HYDROCODONE-ACETAMINOPHEN 5-325 MG PO TABS: 1.0000 | ORAL_TABLET | Freq: Four times a day (QID) | ORAL | Status: DC | PRN

## 2015-10-11 MED ORDER — DEXTROSE 5 % IV SOLN
1.0000 g | INTRAVENOUS | Status: DC
  Administered 2015-10-11 – 2015-10-12 (×2): 1 g via INTRAVENOUS
  Filled 2015-10-11 (×2): qty 1

## 2015-10-11 MED ORDER — OXYMETAZOLINE HCL 0.05 % NA SOLN
1.0000 | Freq: Once | NASAL | Status: DC
  Filled 2015-10-11: qty 15

## 2015-10-11 MED ORDER — INSULIN ASPART 100 UNIT/ML ~~LOC~~ SOLN: 0.0000 [IU] | Freq: Three times a day (TID) | SUBCUTANEOUS | Status: DC

## 2015-10-11 MED ORDER — SUCROFERRIC OXYHYDROXIDE 500 MG PO CHEW
1000.0000 mg | CHEWABLE_TABLET | Freq: Three times a day (TID) | ORAL | Status: DC
  Administered 2015-10-12 (×3): 1000 mg via ORAL
  Filled 2015-10-11 (×5): qty 2

## 2015-10-11 MED ORDER — HYDROMORPHONE HCL 1 MG/ML IJ SOLN
1.0000 mg | Freq: Once | INTRAMUSCULAR | Status: AC
Start: 2015-10-11 — End: 2015-10-11
  Administered 2015-10-11: 1 mg via INTRAVENOUS
  Filled 2015-10-11: qty 1

## 2015-10-11 MED ORDER — ACETAMINOPHEN 650 MG RE SUPP
650.0000 mg | Freq: Four times a day (QID) | RECTAL | Status: DC | PRN
Start: 2015-10-11 — End: 2015-10-14

## 2015-10-11 MED ORDER — OXYCODONE-ACETAMINOPHEN 5-325 MG PO TABS
1.0000 | ORAL_TABLET | Freq: Once | ORAL | Status: AC
  Administered 2015-10-11: 1 via ORAL
  Filled 2015-10-11: qty 1

## 2015-10-11 MED ORDER — REPAGLINIDE 0.5 MG PO TABS
0.5000 mg | ORAL_TABLET | Freq: Two times a day (BID) | ORAL | Status: DC
  Administered 2015-10-12 – 2015-10-14 (×5): 0.5 mg via ORAL
  Filled 2015-10-11 (×8): qty 1

## 2015-10-11 MED ORDER — CALCITRIOL 0.5 MCG PO CAPS: 1.0000 ug | ORAL_CAPSULE | Freq: Every day | ORAL | Status: DC

## 2015-10-11 MED ORDER — ONDANSETRON HCL 4 MG/2ML IJ SOLN: 4.0000 mg | Freq: Four times a day (QID) | INTRAMUSCULAR | Status: DC | PRN

## 2015-10-11 MED ORDER — PREDNISONE 50 MG PO TABS: 50.0000 mg | ORAL_TABLET | Freq: Every day | ORAL | Status: DC

## 2015-10-11 MED ORDER — ASPIRIN 81 MG PO CHEW
81.0000 mg | CHEWABLE_TABLET | Freq: Every day | ORAL | Status: DC
  Administered 2015-10-12 – 2015-10-14 (×3): 81 mg via ORAL
  Filled 2015-10-11 (×3): qty 1

## 2015-10-11 MED ORDER — ONDANSETRON HCL 4 MG PO TABS: 4.0000 mg | ORAL_TABLET | Freq: Four times a day (QID) | ORAL | Status: DC | PRN

## 2015-10-11 NOTE — ED Notes (Signed)
Pt transported to vascular.  °

## 2015-10-11 NOTE — ED Provider Notes (Addendum)
CSN: [id number]     Arrival date & time 10/11/15  0707 History   First MD Initiated Contact with Patient 10/11/15 [phone]     Chief Complaint  Patient presents with  . Hand Pain     (Consider location/radiation/quality/duration/timing/severity/associated sxs/prior Treatment) Patient is a 60 y.o. male presenting with hand pain. The history is provided by the patient.  Hand Pain This is a new problem. The current episode started 6 to 12 hours ago. The problem occurs constantly. The problem has been gradually worsening. Associated symptoms comments: Pain and swelling in the right hand that started last night spontaneously.  Pain is severe and 10/10.  Worse with movement.  Patient states he has had the pain before but is never lasted this long or been continuous. No trauma or procedure to the arm except for dialysis on Tuesday.  He denies fever, chills but states last week he has been fighting off the flu and been on antibiotics.. The symptoms are aggravated by bending and twisting. Nothing relieves the symptoms. He has tried nothing for the symptoms. The treatment provided no relief.    Past Medical History  Diagnosis Date  . Hypertension   . Diabetes mellitus without complication ([place])   . End-stage renal failure with renal transplant ([place])   . Gout   . Impotence of organic origin   . Type I (juvenile type) diabetes mellitus with renal manifestations, not stated as uncontrolled   . Secondary hyperparathyroidism [facility])    Past Surgical History  Procedure Laterality Date  . Birth defect heart surgery  1996  . Kidney transplant  2009  . Appendectomy    . Av fistula placement Right 09/18/2014    Procedure: INSERTION OF ARTERIOVENOUS (AV) GORE-TEX GRAFT ARM USING 4-7MM  X 45CM STRETCH GORE-TEX VASCULAR GRAFT;  Surgeon: Rosetta Posner, MD;  Location: [facility] OR;  Service: Vascular;  Laterality: Right;   Family History  Problem Relation Age of Onset  . Hyperlipidemia Mother   . Hypertension Mother     Social History  Substance Use Topics  . Smoking status: Former Research scientist (life sciences)  . Smokeless tobacco: None  . Alcohol Use: No    Review of Systems  All other systems reviewed and are negative.     Allergies  Iodinated diagnostic agents  Home Medications   Prior to Admission medications   Medication Sig Start Date End Date Taking? Authorizing Provider  aspirin 81 MG tablet Take 81 mg by mouth daily.   Yes Historical Provider, MD  calcitRIOL (ROCALTROL) 0.5 MCG capsule Take 3 capsules (1.5 mcg total) by mouth daily. Patient taking differently: Take 1 mcg by mouth daily.  11/30/13  Yes Modena Jansky, MD  carvedilol (COREG) 25 MG tablet Take 25 mg by mouth 2 (two) times daily with a meal.   Yes Historical Provider, MD  HYDROcodone-acetaminophen (NORCO/VICODIN) 5-325 MG tablet Take 1-2 tablets by mouth every 6 (six) hours as needed for moderate pain. 10/03/15  Yes Fredia Sorrow, MD  predniSONE (DELTASONE) 5 MG tablet Take 5 mg by mouth daily.   Yes Historical Provider, MD  repaglinide (PRANDIN) 0.5 MG tablet Take 1 tablet (0.5 mg total) by mouth 2 (two) times daily before a meal. 06/06/15  Yes Renato Shin, MD   BP 158/82 mmHg  Pulse 101  Temp(Src) 99.6 F (37.6 C) (Oral)  Resp 24  SpO2 100% Physical Exam  Constitutional: He is oriented to person, place, and time. He appears well-developed and well-nourished. No distress.  HENT:  Head:  Normocephalic and atraumatic.  Mouth/Throat: Oropharynx is clear and moist.  Eyes: Conjunctivae and EOM are normal. Pupils are equal, round, and reactive to light.  Neck: Normal range of motion. Neck supple.  Cardiovascular: Regular rhythm and intact distal pulses.  Tachycardia present.   No murmur heard. Pulmonary/Chest: Effort normal and breath sounds normal. No respiratory distress. He has no wheezes. He has no rales.  Abdominal: Soft. He exhibits no distension. There is no tenderness. There is no rebound and no guarding.  Musculoskeletal: Normal  range of motion. He exhibits edema and tenderness.       Right wrist: He exhibits tenderness and swelling.       Arms:      Hands: Neurological: He is alert and oriented to person, place, and time.  Skin: Skin is warm and dry. No rash noted. No erythema.  Psychiatric: He has a normal mood and affect. His behavior is normal.  Nursing note and vitals reviewed.   ED Course  Procedures (including critical care time) Labs Review Labs Reviewed  CBC WITH DIFFERENTIAL/PLATELET - Abnormal; Notable for the following:    RBC 3.80 (*)    Hemoglobin 11.3 (*)    HCT 35.3 (*)    RDW 17.7 (*)    All other components within normal limits  I-STAT CHEM 8, ED - Abnormal; Notable for the following:    Chloride 94 (*)    BUN 41 (*)    Creatinine, Ser 16.30 (*)    Calcium, Ion 0.72 (*)    Hemoglobin 12.9 (*)    HCT 38.0 (*)    All other components within normal limits  CULTURE, BLOOD (ROUTINE X 2)  CULTURE, BLOOD (ROUTINE X 2)    Imaging Review Dg Chest Port 1 View  10/11/2015  CLINICAL DATA:  Cough and end-stage renal disease EXAM: PORTABLE CHEST 1 VIEW COMPARISON:  10/03/2015 FINDINGS: Cardiac shadow is mildly enlarged but stable. Postsurgical changes are again seen. Increased density is noted in the medial right lung base which may represent some early infiltrate. No sizable effusion is seen. IMPRESSION: Likely early infiltrate in the right medial lung base. Electronically Signed   By: Inez Catalina M.D.   On: 10/11/2015 08:51   I have personally reviewed and evaluated these images and lab results as part of my medical decision-making.   EKG Interpretation None      MDM   Final diagnoses:  Pain  CAP (community acquired pneumonia)  Acute gout of right wrist, unspecified cause   patient is a 60 year old end-stage renal patient on dialysis presenting today with 12 hours of severe right hand pain. He has noted some swelling in the hand. He denies any fever chills or infectious symptoms other  than getting over the flu which he had last week. Patient last dialyzed on Tuesday and had a full course without issues. Patient states that he's had pain in his hand like this before but always only lasted a few minutes and then would resolve. Today on exam patient does have a palpable thrill in the arm but has significant tenderness when attempting to move the hand with notable swelling and warmth.  Concern for thrombosis of graft versus thrombophlebitis versus an infected fistula vs gout in the wrist. Also given pt's ongoing URI sx and productive cough will eval. CBC, Chem-8, chest x-ray, blood cultures and venous/arterial duplex pending. Patient and pain control.  9:57 AM Vascular imaging is negative for acute thrombosis, thrombophlebitis or DVT. On repeat evaluation patient's pain is  localized at the wrist tot warm and most likely to be gout. Lower suspicion for fistula infection as he has no tenderness erythema or warmth over the graft itself. Also x-ray does show an early infiltrate concerning for pneumonia with patient's persistent cough and drainage without improvement on a Z-Pak will start him on IV antibiotics. Will treat with prednisone for the gout and admit for further care.  Blanchie Dessert, MD 10/11/15 [id number]  Blanchie Dessert, MD 10/11/15 1014

## 2015-10-11 NOTE — Procedures (Signed)
Patient was seen on dialysis and the procedure was supervised.  BFR 400  Via AVF BP is  121/73.   Patient appears to be tolerating treatment well  Lizzett Nobile A 10/11/2015

## 2015-10-11 NOTE — ED Notes (Signed)
Pt arrives via [facility] from dialysis for right hand and wrist pain. Pt did not get dialyzed today due to pain. Pt denies any injury, numbness or tingling.

## 2015-10-11 NOTE — Progress Notes (Addendum)
Pharmacy Antibiotic Note  Patrick [name] is a 60 y.o. male admitted on 10/11/2015 with pneumonia.  Pharmacy has been consulted for vancomycin dosing. Cefepime per MD adjusted for ESRD on HD - last on 3/14 per EDP note. Afeb, wbc wnl.  Plan: Vanc 2g IV x1; then 1g IV qHD (not yet entered) Cefepime 1g IV q24h per MD adjusted for ESRD Monitor clinical progress, c/s, renal function, abx plan/LOT Pre-HD vanc level as indicated F/u HD schedule - abx maintenance doses     Temp (24hrs), Avg:99 F (37.2 C), Min:98.3 F (36.8 C), Max:99.6 F (37.6 C)   Recent Labs Lab 10/11/15 0831  WBC 5.7  CREATININE 16.30*    Estimated Creatinine Clearance: 5.3 mL/min (by C-G formula based on Cr of 16.3).    Allergies  Allergen Reactions  . Iodinated Diagnostic Agents Rash    Other Reaction: burning to mouth, swelling of lips.    Antimicrobials this admission: 3/16 vanc >>  3/16 cefepime >>   Dose adjustments this admission: n/a  Microbiology results: 3/16 BCx:   3/16 Sputum:     Elicia Lamp, PharmD, [facility] Clinical Pharmacist Pager [phone] 10/11/2015 10:24 AM

## 2015-10-11 NOTE — Consult Note (Signed)
[facility] Renal Consultation Note    Indication for Consultation:  Management of ESRD/hemodialysis; anemia, hypertension/volume and secondary hyperparathyroidism PCP: Elyn Peers, MD  HPI: Patrick [name] is a 60 y.o. male with ESRD on hemodialysis TTS at [place] center. PMH significant for renal transplant ([facility] 2008) with acute rejection episode 2015 . He restarted hemodialysis 09/15/2014, DMT1, hypertension, gout, secondary hyperparathyroidism, anemia of chronic disease. He presented to HD clinic for scheduled dialysis C/O pain in R forearm. Was sent to ED for evaluation. Was seen in ED upon arrival, RFA was exquisitely tender to touch, warm to touch. UE dopplers ordered were negative for DVT, RUA AVF appeared patent. Patient has history of gout and has recently been treated for URI symptoms-just finished Z-pack. He did C/O mild dyspnea on exertion. CXR revealed Likely early infiltrate in the right medial lung base. WBC 5.7 Hgb 11.3 Na 135 K+ 3.5 BUN 41 Scr 16.3. He is being admitted with gout of R. Wrist and possible HCAP.  He has hemodialysis TTS at [name]. He has been tapering predisone, is no longer on immunosuppressants. Compliant with HD prescription, does not have high IDWG.    Past Medical History  Diagnosis Date  . Hypertension   . Diabetes mellitus without complication ([place])   . End-stage renal failure with renal transplant ([place])   . Gout   . Impotence of organic origin   . Type I (juvenile type) diabetes mellitus with renal manifestations, not stated as uncontrolled   . Secondary hyperparathyroidism [facility])    Past Surgical History  Procedure Laterality Date  . Birth defect heart surgery  1996  . Kidney transplant  2009  . Appendectomy    . Av fistula placement Right 09/18/2014    Procedure: INSERTION OF ARTERIOVENOUS (AV) GORE-TEX GRAFT ARM USING 4-7MM  X 45CM STRETCH GORE-TEX VASCULAR GRAFT;  Surgeon: Rosetta Posner, MD;  Location: [facility] OR;  Service:  Vascular;  Laterality: Right;   Family History  Problem Relation Age of Onset  . Hyperlipidemia Mother   . Hypertension Mother    Social History:  reports that he has quit smoking. He does not have any smokeless tobacco history on file. He reports that he does not drink alcohol or use illicit drugs. Allergies  Allergen Reactions  . Iodinated Diagnostic Agents Rash    Other Reaction: burning to mouth, swelling of lips.   Prior to Admission medications   Medication Sig Start Date End Date Taking? Authorizing Provider  aspirin 81 MG tablet Take 81 mg by mouth daily.   Yes Historical Provider, MD  calcitRIOL (ROCALTROL) 0.5 MCG capsule Take 3 capsules (1.5 mcg total) by mouth daily. Patient taking differently: Take 1 mcg by mouth daily.  11/30/13  Yes Modena Jansky, MD  carvedilol (COREG) 25 MG tablet Take 25 mg by mouth 2 (two) times daily with a meal.   Yes Historical Provider, MD  HYDROcodone-acetaminophen (NORCO/VICODIN) 5-325 MG tablet Take 1-2 tablets by mouth every 6 (six) hours as needed for moderate pain. 10/03/15  Yes Fredia Sorrow, MD  predniSONE (DELTASONE) 5 MG tablet Take 5 mg by mouth daily.   Yes Historical Provider, MD  repaglinide (PRANDIN) 0.5 MG tablet Take 1 tablet (0.5 mg total) by mouth 2 (two) times daily before a meal. 06/06/15  Yes Renato Shin, MD   Current Facility-Administered Medications  Medication Dose Route Frequency Provider Last Rate Last Dose  . acetaminophen (TYLENOL) tablet 650 mg  650 mg Oral Q6H PRN Samella Parr,  NP       Or  . acetaminophen (TYLENOL) suppository 650 mg  650 mg Rectal Q6H PRN Samella Parr, NP      . aspirin tablet 81 mg  81 mg Oral Daily Samella Parr, NP      . calcitRIOL (ROCALTROL) capsule 1 mcg  1 mcg Oral Daily Samella Parr, NP      . carvedilol (COREG) tablet 25 mg  25 mg Oral BID WC Samella Parr, NP      . ceFEPIme (MAXIPIME) 1 g in dextrose 5 % 50 mL IVPB  1 g Intravenous Q24H Samella Parr, NP   Stopped at  10/11/15 1259  . heparin injection 5,000 Units  5,000 Units Subcutaneous 3 times per day Samella Parr, NP      . HYDROcodone-acetaminophen (NORCO/VICODIN) 5-325 MG per tablet 1-2 tablet  1-2 tablet Oral Q6H PRN Samella Parr, NP      . insulin aspart (novoLOG) injection 0-5 Units  0-5 Units Subcutaneous QHS Samella Parr, NP      . insulin aspart (novoLOG) injection 0-9 Units  0-9 Units Subcutaneous TID WC Samella Parr, NP   0 Units at 10/11/15 1200  . morphine 2 MG/ML injection 1-2 mg  1-2 mg Intravenous Q2H PRN Samella Parr, NP      . ondansetron [facility]) tablet 4 mg  4 mg Oral Q6H PRN Samella Parr, NP       Or  . ondansetron [facility]) injection 4 mg  4 mg Intravenous Q6H PRN Samella Parr, NP      . oxymetazoline (AFRIN) 0.05 % nasal spray 1 spray  1 spray Each Nare Once Blanchie Dessert, MD   Stopped at 10/11/15 1225  . [START ON 10/12/2015] predniSONE (DELTASONE) tablet 50 mg  50 mg Oral Q breakfast Samella Parr, NP      . predniSONE (DELTASONE) tablet 50 mg  50 mg Oral Q breakfast Samella Parr, NP   50 mg at 10/11/15 1227  . repaglinide (PRANDIN) tablet 0.5 mg  0.5 mg Oral BID AC Samella Parr, NP      . vancomycin (VANCOCIN) 2,000 mg in sodium chloride 0.9 % 500 mL IVPB  2,000 mg Intravenous Once Romona Curls, RPH 250 mL/hr at 10/11/15 1300 2,000 mg at 10/11/15 1300   Current Outpatient Prescriptions  Medication Sig Dispense Refill  . aspirin 81 MG tablet Take 81 mg by mouth daily.    . calcitRIOL (ROCALTROL) 0.5 MCG capsule Take 3 capsules (1.5 mcg total) by mouth daily. (Patient taking differently: Take 1 mcg by mouth daily. ) 90 capsule 0  . carvedilol (COREG) 25 MG tablet Take 25 mg by mouth 2 (two) times daily with a meal.    . HYDROcodone-acetaminophen (NORCO/VICODIN) 5-325 MG tablet Take 1-2 tablets by mouth every 6 (six) hours as needed for moderate pain. 10 tablet 0  . predniSONE (DELTASONE) 5 MG tablet Take 5 mg by mouth daily.    . repaglinide  (PRANDIN) 0.5 MG tablet Take 1 tablet (0.5 mg total) by mouth 2 (two) times daily before a meal. 60 tablet 11   Labs: Basic Metabolic Panel:  Recent Labs Lab 10/11/15 0831  NA 135  K 3.5  CL 94*  GLUCOSE 92  BUN 41*  CREATININE 16.30*   Liver Function Tests: No results for input(s): AST, ALT, ALKPHOS, BILITOT, PROT, ALBUMIN in the last 168 hours. No results for input(s): LIPASE, AMYLASE in  the last 168 hours. No results for input(s): AMMONIA in the last 168 hours. CBC:  Recent Labs Lab 10/11/15 0831  WBC 5.7  NEUTROABS 3.7  HGB 11.3*  12.9*  HCT 35.3*  38.0*  MCV 92.9  PLT 202   Cardiac Enzymes: No results for input(s): CKTOTAL, CKMB, CKMBINDEX, TROPONINI in the last 168 hours. CBG: No results for input(s): GLUCAP in the last 168 hours. Iron Studies: No results for input(s): IRON, TIBC, TRANSFERRIN, FERRITIN in the last 72 hours. Studies/Results: Dg Chest Port 1 View  10/11/2015  CLINICAL DATA:  Cough and end-stage renal disease EXAM: PORTABLE CHEST 1 VIEW COMPARISON:  10/03/2015 FINDINGS: Cardiac shadow is mildly enlarged but stable. Postsurgical changes are again seen. Increased density is noted in the medial right lung base which may represent some early infiltrate. No sizable effusion is seen. IMPRESSION: Likely early infiltrate in the right medial lung base. Electronically Signed   By: Inez Catalina M.D.   On: 10/11/2015 08:51    ROS: As per HPI otherwise negative.   Physical Exam: Filed Vitals:   10/11/15 1030 10/11/15 1237 10/11/15 1245 10/11/15 1304  BP: 112/85 148/96 177/98   Pulse: 95     Temp:    100.1 F (37.8 C)  TempSrc:    Oral  Resp:      SpO2: 99%        General: Well developed, well nourished, in mild distress due to pain in R. Arm.  Head: Normocephalic, atraumatic, sclera non-icteric, mucus membranes are moist Neck: Supple. JVD not elevated. Lungs: Bilateral breath sounds, slightly decreased in bases. CTA A/P Heart: RRR with S1 S2. No  murmurs, rubs, or gallops appreciated. Abdomen: Soft, non-tender, non-distended with normoactive bowel sounds. No rebound/guarding. No obvious abdominal masses. M-S:  Strength and tone appear normal for age. Lower extremities: No LE edema Neuro: Alert and oriented X 3. Moves all extremities spontaneously. Psych:  Responds to questions appropriately with a normal affect. Dialysis Access: RUA AVF + thrill + bruit  Dialysis Orders: Center: [facility] TTS TueThuSat, 4 hrs 0 min, 180NRe Optiflux, BFR 400, DFR Manual 800 mL/min, EDW 102 (kg), Dialysate 2.0 K, 2.25 Ca, UFR Profile: Profile 4, Sodium Model: Linear, Access: RUA AV graft Heparin: 1600 units q treatment Mircera: 100 mg IV q 2 weeks (10/02/15) Hectoral: 2 mcg IV Q TTS (last dose 10/09/15)  Assessment/Plan: 1.  HCAP: per primary. Has been started on vancomycin and Maxipime. Tmax 100.1 orally.  2.  Gout R Wrist: has been started on Prednisone per primary, checking uric acid.- already feels better  3.  ESRD -  TTS @ [facility]. Will have HD today on schedule.  4.  Hypertension/volume  - BP elevated but likely due to pain. On Coreg 25 mg PO BID as OP. Currently shows no evidence of volume overload. Will check wt prior to HD, attempt 1-1.5 liters as tolerated. Is under his EDW and BP is good so running even- seems like EDW needs down as OP- says he has not been eating  5.  Anemia  - HGB 12.9. Last ESA dose 10/02/15. Follow HGB 6.  Metabolic bone disease -  Takes daily calcitriol- says he used to but does not now, velphoro binders, is on VDRA. Has been having low Ca levels-last in center values  Ca 7.3 C Ca 7 .4 (09/20/15) phos 5.6 (10/04/14) 7.  Nutrition - Renal/Carb mod diet, renal vit and nepro.  8.  DM: per primary.   Rita H. Brown, NP-C 10/11/2015, 1:06  PM  [facility] Beeper [phone]  Patient seen and examined, agree with above note with above modifications. 60 year old ESRD pt- presenting with wrist pain and swelling-  thought to be gout- also HCAP ? Seems like EDW will need to be adjusted down as OP Corliss Parish, MD 10/11/2015

## 2015-10-11 NOTE — H&P (Signed)
[facility] History and Physical                                                                                    Patrick [name], is a 60 y.o. male  MRN: [id number]   DOB - Dec 20, 1955  Admit Date - 10/11/2015  Outpatient Primary MD for the patient is Elyn Peers, MD  Referring MD: Maryan Rued / ER   Consulting M.D: Florene Glen / Nephrology    PMH: Past Medical History  Diagnosis Date  . Hypertension   . Diabetes mellitus without complication ([place])   . End-stage renal failure with renal transplant ([place])   . Gout   . Impotence of organic origin   . Type I (juvenile type) diabetes mellitus with renal manifestations, not stated as uncontrolled   . Secondary hyperparathyroidism ([place])       PSH: Past Surgical History  Procedure Laterality Date  . Birth defect heart surgery  1996  . Kidney transplant  2009  . Appendectomy    . Av fistula placement Right 09/18/2014    Procedure: INSERTION OF ARTERIOVENOUS (AV) GORE-TEX GRAFT ARM USING 4-7MM  X 45CM STRETCH GORE-TEX VASCULAR GRAFT;  Surgeon: Rosetta Posner, MD;  Location: [facility];  Service: Vascular;  Laterality: Right;     CC:  Chief Complaint  Patient presents with  . Hand Pain     HPI: 60 year old male patient previous renal transplant that has failed now back on dialysis 1 year, history of gout, diabetes on Prandin, hypertension, anemia of chronic kidney disease presents to the hospital complaining of abrupt onset of left hand pain with swelling. Patient reports symptoms began abruptly last night. Significant pain involving the right wrist as well as the thenar area at the base of the thumb. Of note patient has recently been treated for upper respiratory infection. He was seen in the ER and was given a Z-Pak which he completed yesterday. He has not had any fevers or chills for more than 48 hours. His sputum has improved from yellow productive to clear. He does report some dyspnea on exertion. Because of significant pain in the  hand with associated swelling in the dialysis access arm he was sent from dialysis to the ER for evaluation.  ER Evaluation and treatment: Oral temp 99.6-BP 158/82-HR 101-RR 24  PCXR: Likely early infiltrate and right medial lung base Vascular ultrasound right upper extremity: Preliminary results reveal what appears to be a patent right upper arm AV graft without evidence of DVT or superficial thrombosis Laboratory data: Na 135, K 3.5, BUN 41, Cr 16.30, PVCs 5700 without left shift, hemoglobin 11.3, platelets 202,000; blood cultures obtained in the ER  Review of Systems   In addition to the HPI above,  No recurrence of Fever-chills, myalgias or other constitutional symptoms No Headache, changes with Vision or hearing, new weakness, tingling, numbness in any extremity, No problems swallowing food or Liquids, indigestion/reflux No Chest pain, Cough or significant Shortness of Breath, no palpitations, orthopnea or DOE No Abdominal pain, N/V; no melena or hematochezia, no dark tarry stools, Bowel movements are regular, No dysuria, hematuria or flank pain No new skin rashes, lesions, masses or bruises,  No recent weight gain or loss No polyuria, polydypsia or polyphagia,  *A full 10 point Review of Systems was done, except as stated above, all other Review of Systems were negative.  Social History Social History  Substance Use Topics  . Smoking status: Former Research scientist (life sciences)  . Smokeless tobacco: Not on file  . Alcohol Use: No    Resides at: Private residence  Lives with: Spouse  Ambulatory status: Without assistive devices   Family History Family History  Problem Relation Age of Onset  . Hyperlipidemia Mother   . Hypertension Mother      Prior to Admission medications   Medication Sig Start Date End Date Taking? Authorizing Provider  aspirin 81 MG tablet Take 81 mg by mouth daily.   Yes Historical Provider, MD  calcitRIOL (ROCALTROL) 0.5 MCG capsule Take 3 capsules (1.5 mcg total)  by mouth daily. Patient taking differently: Take 1 mcg by mouth daily.  11/30/13  Yes Modena Jansky, MD  carvedilol (COREG) 25 MG tablet Take 25 mg by mouth 2 (two) times daily with a meal.   Yes Historical Provider, MD  HYDROcodone-acetaminophen (NORCO/VICODIN) 5-325 MG tablet Take 1-2 tablets by mouth every 6 (six) hours as needed for moderate pain. 10/03/15  Yes Fredia Sorrow, MD  predniSONE (DELTASONE) 5 MG tablet Take 5 mg by mouth daily.   Yes Historical Provider, MD  repaglinide (PRANDIN) 0.5 MG tablet Take 1 tablet (0.5 mg total) by mouth 2 (two) times daily before a meal. 06/06/15  Yes Renato Shin, MD    Allergies  Allergen Reactions  . Iodinated Diagnostic Agents Rash    Other Reaction: burning to mouth, swelling of lips.    Physical Exam  Vitals  Blood pressure 112/85, pulse 95, temperature 98.3 F (36.8 C), temperature source Oral, resp. rate 24, SpO2 99 %.   General:  In no acute distress, appears healthy and well nourished  Psych:  Normal affect, Denies Suicidal or Homicidal ideations, Awake Alert, Oriented X 3. Speech and thought patterns are clear and appropriate, no apparent short term memory deficits  Neuro:   No focal neurological deficits, CN II through XII intact, Strength 5/5 all 4 extremitiesExcept for focal weakness involving right hand and thumb secondary to severity of pain, Sensation intact all 4 extremities.  ENT:  Ears and Eyes appear Normal, Conjunctivae clear, PER. Moist oral mucosa without erythema or exudates. Noted clear rhinorrhea and obvious nasal congestion  Neck:  Supple, No lymphadenopathy appreciated  Respiratory:  Symmetrical chest wall movement, Good air movement bilaterally, coarse without wheezing. Room Air saturations 100% at rest  Cardiac:  RRR, No Murmurs, no LE edema noted, no JVD, No carotid bruits, peripheral pulses palpable at 2+  Abdomen:  Positive bowel sounds, Soft, Non tender, Non distended,  No masses appreciated, no obvious  hepatosplenomegaly  Skin:  No Cyanosis, Normal Skin Turgor, No Skin Rash or Bruise.  Extremities: Symmetrical without obvious trauma or injury,  focal edema consistent with diffusion involving the right hand primarily from the wrist to the right thumb with focal tenderness at PIP (base) of thumb-no erythema  Data Review  CBC  Recent Labs Lab 10/11/15 0831  WBC 5.7  HGB 11.3*  12.9*  HCT 35.3*  38.0*  PLT 202  MCV 92.9  MCH 29.7  MCHC 32.0  RDW 17.7*  LYMPHSABS 1.3  MONOABS 0.5  EOSABS 0.2  BASOSABS 0.0    Chemistries   Recent Labs Lab 10/11/15 0831  NA 135  K 3.5  CL  94*  GLUCOSE 92  BUN 41*  CREATININE 16.30*    estimated creatinine clearance is 5.3 mL/min (by C-G formula based on Cr of 16.3).  No results for input(s): TSH, T4TOTAL, T3FREE, THYROIDAB in the last 72 hours.  Invalid input(s): FREET3  Coagulation profile No results for input(s): INR, PROTIME in the last 168 hours.  No results for input(s): DDIMER in the last 72 hours.  Cardiac Enzymes No results for input(s): CKMB, TROPONINI, MYOGLOBIN in the last 168 hours.  Invalid input(s): CK  Invalid input(s): POCBNP  Urinalysis    Component Value Date/Time   COLORURINE YELLOW 11/24/2013 [place] 11/24/2013 0242   LABSPEC 1.012 11/24/2013 0242   PHURINE 6.5 11/24/2013 0242   GLUCOSEU NEGATIVE 11/24/2013 0242   HGBUR SMALL* 11/24/2013 0242   BILIRUBINUR NEGATIVE 11/24/2013 0242   KETONESUR NEGATIVE 11/24/2013 0242   PROTEINUR 30* 11/24/2013 0242   UROBILINOGEN 0.2 11/24/2013 0242   NITRITE NEGATIVE 11/24/2013 0242   LEUKOCYTESUR NEGATIVE 11/24/2013 0242    Imaging results:   Dg Chest 2 View  10/03/2015  CLINICAL DATA:  Persistent dry cough.  Chest congestion, fever. EXAM: CHEST  2 VIEW COMPARISON:  09/14/2014 FINDINGS: Prior CABG. Cardiomegaly. No confluent opacities, effusions or edema. No acute bony abnormality. IMPRESSION: Cardiomegaly.  No active disease.  Electronically Signed   By: Rolm Baptise M.D.   On: 10/03/2015 07:50   Dg Chest Port 1 View  10/11/2015  CLINICAL DATA:  Cough and end-stage renal disease EXAM: PORTABLE CHEST 1 VIEW COMPARISON:  10/03/2015 FINDINGS: Cardiac shadow is mildly enlarged but stable. Postsurgical changes are again seen. Increased density is noted in the medial right lung base which may represent some early infiltrate. No sizable effusion is seen. IMPRESSION: Likely early infiltrate in the right medial lung base. Electronically Signed   By: Inez Catalina M.D.   On: 10/11/2015 08:51      Assessment & Plan  Principal Problem:   HCAP -Patient has completed outpatient Zithromax with persistent respiratory symptoms and ongoing fatigue with some minor dyspnea on exertion; has focal infiltrate which is new compared with chest x-ray one week prior and given patient on chronic steroids and dialysis will treat as healthcare acquired etiology -Admit to Floor/Obs -Currently does not appear to be hypoxemic at rest but we'll check ambulatory pulse oximetry -Empiric antibiotics with vancomycin and Maxipime -Follow-up on blood cultures  Active Problems:   Acute gout/right hand and wrist swelling and pain -Patient with history of gout but in the past has involved ankle and feet area -Current symptomatology appears consistent with gout -Stat uric acid level -Holding preadmission prednisone 5 mg in favor of 50 mg by mouth daily with likely rapid taper if this treatment demonstrates improvement in symptomatology -If after above symptoms have not significantly improved, consider pain and effusion potentially related to infectious etiology and recommend consult orthopedics/IR for aspiration -Follow up on blood cultures     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  -Has been back on dialysis 1 year -Previous Prograf and CellCept tapered and discontinued over the past year remains on low-dose prednisone 5 mg daily -Today  is dialysis today and nephrology aware of admission -Does not appear to be volume overloaded on exam and potassium is actually low at 3.5    Diabetes mellitus with renal manifestations, controlled -Has transitioned from insulin to Prandin -Monitor for hypoglycemia in setting of high dose pulse steroids -Monitor CBGs -SSI -HgbA1c    Essential hypertension/Grade 1 Diastolic  Dysfunction -Currently controlled -Continue carvedilol -Last echo was in 2015 and had no associated aortic stenosis or pulmonary hypertension although did have mild pulmonic valve regurgitation    Anemia in chronic kidney disease -Hemoglobin stable and at baseline of around 11    DVT Prophylaxis: Subcutaneous heparin  Family Communication:   Spouse at bedside  Code Status:  Full code  Condition:  Stable  Discharge disposition: Anticipate discharge within the next 48 hours pending outcome of above evaluations and medical stability; should return to preadmission environment  Time spent in minutes : 60      ELLIS,ALLISON L. ANP on 10/11/2015 at 10:47 AM  You may contact me by going to [email or web address] - password [email or web address]  I am available from 7a-7p but please confirm I am on the schedule by going to Amion as above.   After 7p please contact night coverage person covering me after hours  [facility]   Patient seen and examined. Agree with above note by Erin Hearing, NP. Patient comes to the hospital today complaining of right wrist and hand pain and edema that began overnight. Of note he recently came to the emergency department and was sent home on a Z-Pak for upper respiratory symptoms. Because of persistent cough a chest x-ray was performed which shows an early developing infiltrate. Since he is on dialysis decision has been made to treat him for HCAP with broad-spectrum antibiotics, culture data has been requested. Recommend rapid de-escalation of antibiotics as long as he remains nontoxic. Suspect  his right wrist and first MCP tenderness are likely related to gout. Will give a rapid burst of steroids which can be tapered with symptomatic improvement. If cultures turn positive or if pain persists despite steroids, would advocate for an arthrocentesis to further guide care.  Domingo Mend, MD [facility] Pager: [phone]

## 2015-10-11 NOTE — Progress Notes (Signed)
Uric acid was 7.8 so will continue Prednisone- will likely need a slow taper.  Erin Hearing, ANP

## 2015-10-11 NOTE — Progress Notes (Signed)
*  PRELIMINARY RESULTS* Vascular Ultrasound Duplex Dialysis Access (AVF, AGV) and Right upper extremity venous duplex has been completed.  Preliminary findings: Appears to be patent right upper arm AV graft. No evidence of DVT or superficial thrombosis right upper extremity.   Landry Mellow, RDMS, RVT  10/11/2015, 9:32 AM

## 2015-10-12 ENCOUNTER — Observation Stay (HOSPITAL_COMMUNITY): Payer: Medicare Other

## 2015-10-12 DIAGNOSIS — N189 Chronic kidney disease, unspecified: Secondary | ICD-10-CM | POA: Diagnosis not present

## 2015-10-12 DIAGNOSIS — M1000 Idiopathic gout, unspecified site: Secondary | ICD-10-CM | POA: Diagnosis not present

## 2015-10-12 DIAGNOSIS — Z992 Dependence on renal dialysis: Secondary | ICD-10-CM | POA: Diagnosis not present

## 2015-10-12 DIAGNOSIS — R600 Localized edema: Secondary | ICD-10-CM | POA: Diagnosis not present

## 2015-10-12 DIAGNOSIS — I120 Hypertensive chronic kidney disease with stage 5 chronic kidney disease or end stage renal disease: Secondary | ICD-10-CM | POA: Diagnosis present

## 2015-10-12 DIAGNOSIS — Y830 Surgical operation with transplant of whole organ as the cause of abnormal reaction of the patient, or of later complication, without mention of misadventure at the time of the procedure: Secondary | ICD-10-CM | POA: Diagnosis present

## 2015-10-12 DIAGNOSIS — M109 Gout, unspecified: Secondary | ICD-10-CM | POA: Diagnosis present

## 2015-10-12 DIAGNOSIS — D631 Anemia in chronic kidney disease: Secondary | ICD-10-CM

## 2015-10-12 DIAGNOSIS — M10032 Idiopathic gout, left wrist: Secondary | ICD-10-CM

## 2015-10-12 DIAGNOSIS — J069 Acute upper respiratory infection, unspecified: Secondary | ICD-10-CM | POA: Diagnosis not present

## 2015-10-12 DIAGNOSIS — J189 Pneumonia, unspecified organism: Secondary | ICD-10-CM | POA: Diagnosis present

## 2015-10-12 DIAGNOSIS — T8612 Kidney transplant failure: Secondary | ICD-10-CM | POA: Diagnosis present

## 2015-10-12 DIAGNOSIS — R609 Edema, unspecified: Secondary | ICD-10-CM | POA: Diagnosis not present

## 2015-10-12 DIAGNOSIS — E1122 Type 2 diabetes mellitus with diabetic chronic kidney disease: Secondary | ICD-10-CM | POA: Diagnosis present

## 2015-10-12 LAB — CBC
HEMATOCRIT: 27.8 % — AB (ref 39.0–52.0)
HEMOGLOBIN: 8.5 g/dL — AB (ref 13.0–17.0)
MCH: 28.3 pg (ref 26.0–34.0)
MCHC: 30.6 g/dL (ref 30.0–36.0)
MCV: 92.7 fL (ref 78.0–100.0)
Platelets: 215 10*3/uL (ref 150–400)
RBC: 3 MIL/uL — AB (ref 4.22–5.81)
RDW: 17.6 % — ABNORMAL HIGH (ref 11.5–15.5)
WBC: 5.8 10*3/uL (ref 4.0–10.5)

## 2015-10-12 LAB — GLUCOSE, CAPILLARY
GLUCOSE-CAPILLARY: 104 mg/dL — AB (ref 65–99)
GLUCOSE-CAPILLARY: 126 mg/dL — AB (ref 65–99)
Glucose-Capillary: 168 mg/dL — ABNORMAL HIGH (ref 65–99)
Glucose-Capillary: 169 mg/dL — ABNORMAL HIGH (ref 65–99)

## 2015-10-12 LAB — HEMOGLOBIN A1C
Hgb A1c MFr Bld: 5.4 % (ref 4.8–5.6)
MEAN PLASMA GLUCOSE: 108 mg/dL

## 2015-10-12 LAB — VANCOMYCIN, TROUGH: Vancomycin Tr: 27 ug/mL (ref 10.0–20.0)

## 2015-10-12 MED ORDER — DARBEPOETIN ALFA 100 MCG/0.5ML IJ SOSY
100.0000 ug | PREFILLED_SYRINGE | INTRAMUSCULAR | Status: DC
  Administered 2015-10-13: 100 ug via INTRAVENOUS

## 2015-10-12 MED ORDER — VANCOMYCIN HCL IN DEXTROSE 1-5 GM/200ML-% IV SOLN: 1000.0000 mg | INTRAVENOUS | Status: DC

## 2015-10-12 MED ORDER — RENA-VITE PO TABS
1.0000 | ORAL_TABLET | Freq: Every day | ORAL | Status: DC
  Filled 2015-10-12: qty 1

## 2015-10-12 MED ORDER — COLCHICINE 0.6 MG PO TABS
0.6000 mg | ORAL_TABLET | Freq: Every day | ORAL | Status: DC
  Administered 2015-10-12 – 2015-10-13 (×2): 0.6 mg via ORAL
  Filled 2015-10-12 (×2): qty 1

## 2015-10-12 MED ORDER — DEXTROSE 5 % IV SOLN
2.0000 g | INTRAVENOUS | Status: DC
  Administered 2015-10-13: 2 g via INTRAVENOUS
  Filled 2015-10-12 (×3): qty 2

## 2015-10-12 NOTE — Progress Notes (Signed)
[facility] PROGRESS NOTE  Lonn Luginbill [id number] DOB: 10-30-1955 DOA: 10/11/2015 PCP: Elyn Peers, MD  Length of stay:    Assessment/Plan: Principal Problem:   HCAP (healthcare-associated pneumonia) Active Problems:   Essential hypertension   Acute gout   End-stage renal failure with renal transplant ([place])   Diabetes mellitus type 2, controlled ([place])   Anemia in chronic kidney disease   Kidney transplant failure   Acute respiratory disease   CAP (community acquired pneumonia)   HPI: 60 year old male patient previous renal transplant that has failed now back on dialysis 1 year, history of gout, diabetes on Prandin, hypertension, anemia of chronic kidney disease presents to the hospital complaining of abrupt onset of left hand pain with swelling. Patient reports symptoms began abruptly last night. Significant pain involving the right wrist as well as the thenar area at the base of the thumb. Of note patient has recently been treated for upper respiratory infection. He was seen in the ER and was given a Z-Pak which he completed yesterday. He has not had any fevers or chills for more than 48 hours. His sputum has improved from yellow productive to clear. He does report some dyspnea on exertion. Because of significant pain in the hand with associated swelling in the dialysis access arm he was sent from dialysis to the ER for evaluation    Assessment & Plan     HCAP -Patient has completed outpatient Zithromax with persistent respiratory symptoms and ongoing fatigue with some minor dyspnea on exertion; has focal infiltrate which is new compared with chest x-ray one week prior and given patient on chronic steroids and dialysis will treat as healthcare acquired etiology Continue telemetry -Currently does not appear to be hypoxemic at rest but we'll check ambulatory pulse oximetry -Empiric antibiotics with vancomycin and Maxipime -Follow-up on blood cultures     Acute  gout/right hand and wrist swelling and pain Continue colchicine and prednisone, uric acid 7.8 -Holding preadmission prednisone 5 mg in favor of 50 mg by mouth daily with likely rapid taper if this treatment demonstrates improvement in symptomatology -If after above symptoms have not significantly improved, consider pain and effusion potentially related to infectious etiology and recommend consult orthopedics/IR for aspiration Obtain MRI of the right hand   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  -Has been back on dialysis 1 year -Previous Prograf and CellCept tapered and discontinued over the past year remains on low-dose prednisone 5 mg daily Nephrology following     Diabetes mellitus with renal manifestations, controlled -Has transitioned from insulin to Prandin -Monitor for hypoglycemia in setting of high dose pulse steroids -Monitor CBGs -SSI -HgbA1c pending   Essential hypertension/Grade 1 Diastolic Dysfunction -Currently controlled -Continue carvedilol -Last echo was in 2015 and had no associated aortic stenosis or pulmonary hypertension although did have mild pulmonic valve regurgitation   Anemia in chronic kidney disease -Hemoglobin stable and at baseline of around 11 , Currently 8.5, no overt bleeding    DVT prophylaxsis heparin  Code Status:      Code Status Orders        Start     Ordered   10/11/15 1305  Full code   Continuous     10/11/15 1304     Family Communication: Discussed in detail with the patient, all imaging results, lab results explained to the patient   Disposition Plan:  MRI of the right hand     Consultants:  Nephrology    Procedures:  None  Antibiotics: Anti-infectives    Start     Dose/Rate Route Frequency Ordered Stop   10/11/15 1045  vancomycin (VANCOCIN) 2,000 mg in sodium chloride 0.9 % 500 mL IVPB     2,000 mg 250 mL/hr over 120 Minutes Intravenous  Once 10/11/15 1032 10/11/15 1500   10/11/15 1030   ceFEPIme (MAXIPIME) 1 g in dextrose 5 % 50 mL IVPB     1 g 100 mL/hr over 30 Minutes Intravenous Every 24 hours 10/11/15 1013 10/19/15 1029   10/11/15 1030  vancomycin (VANCOCIN) 2,250 mg in sodium chloride 0.9 % 500 mL IVPB  Status:  Discontinued     2,250 mg 250 mL/hr over 120 Minutes Intravenous  Once 10/11/15 1025 10/11/15 1032   10/11/15 1015  cefTRIAXone (ROCEPHIN) 1 g in dextrose 5 % 50 mL IVPB  Status:  Discontinued     1 g 100 mL/hr over 30 Minutes Intravenous  Once 10/11/15 1001 10/11/15 1012   10/11/15 1015  azithromycin (ZITHROMAX) 500 mg in dextrose 5 % 250 mL IVPB  Status:  Discontinued     500 mg 250 mL/hr over 60 Minutes Intravenous  Once 10/11/15 1001 10/11/15 1012         HPI/Subjective: Patient did not like, modified her renal diet, once a regular diet, also complaining of pain and swelling in the Right hand  Objective: Filed Vitals:   10/11/15 1937 10/11/15 2300 10/12/15 0300 10/12/15 0550  BP: 188/86 158/89  140/76  Pulse: 100 98  92  Temp: 98.8 F (37.1 C)   99.5 F (37.5 C)  TempSrc: Oral   Oral  Resp: 20   20  Height:      Weight:   87.952 kg (193 lb 14.4 oz)   SpO2: 100%   94%    Intake/Output Summary (Last 24 hours) at 10/12/15 1115 Last data filed at 10/12/15 0654  Gross per 24 hour  Intake   1020 ml  Output    325 ml  Net    695 ml    Exam:  General: No acute respiratory distress Lungs: Clear to auscultation bilaterally without wheezes or crackles Cardiovascular: Regular rate and rhythm without murmur gallop or rub normal S1 and S2 Abdomen: Nontender, nondistended, soft, bowel sounds positive, no rebound, no ascites, no appreciable mass Extremities: Symmetrical without obvious trauma or injury, focal edema consistent with diffusion involving the right hand primarily from the wrist to the right thumb with focal tenderness at PIP (base) of thumb-no erythema     Data Review   Micro Results No results found for this or any previous  visit (from the past 240 hour(s)).  Radiology Reports Dg Chest 2 View  10/03/2015  CLINICAL DATA:  Persistent dry cough.  Chest congestion, fever. EXAM: CHEST  2 VIEW COMPARISON:  09/14/2014 FINDINGS: Prior CABG. Cardiomegaly. No confluent opacities, effusions or edema. No acute bony abnormality. IMPRESSION: Cardiomegaly.  No active disease. Electronically Signed   By: Rolm Baptise M.D.   On: 10/03/2015 07:50   Dg Chest Port 1 View  10/11/2015  CLINICAL DATA:  Cough and end-stage renal disease EXAM: PORTABLE CHEST 1 VIEW COMPARISON:  10/03/2015 FINDINGS: Cardiac shadow is mildly enlarged but stable. Postsurgical changes are again seen. Increased density is noted in the medial right lung base which may represent some early infiltrate. No sizable effusion is seen. IMPRESSION: Likely early infiltrate in the right medial lung base. Electronically Signed   By: Inez Catalina M.D.   On: 10/11/2015 08:51  CBC  Recent Labs Lab 10/11/15 0831 10/11/15 1621 10/12/15 0601  WBC 5.7 6.7 5.8  HGB 11.3*  12.9* 8.8* 8.5*  HCT 35.3*  38.0* 28.6* 27.8*  PLT 202 248 215  MCV 92.9 92.3 92.7  MCH 29.7 28.4 28.3  MCHC 32.0 30.8 30.6  RDW 17.7* 17.6* 17.6*  LYMPHSABS 1.3  --   --   MONOABS 0.5  --   --   EOSABS 0.2  --   --   BASOSABS 0.0  --   --     Chemistries   Recent Labs Lab 10/11/15 0831 10/11/15 1623  NA 135 133*  K 3.5 3.7  CL 94* 95*  CO2  --  22  GLUCOSE 92 101*  BUN 41* 46*  CREATININE 16.30* 16.35*  CALCIUM  --  6.5*   ------------------------------------------------------------------------------------------------------------------ estimated creatinine clearance is 4.9 mL/min (by C-G formula based on Cr of 16.35). ------------------------------------------------------------------------------------------------------------------ No results for input(s): HGBA1C in the last 72  hours. ------------------------------------------------------------------------------------------------------------------ No results for input(s): CHOL, HDL, LDLCALC, TRIG, CHOLHDL, LDLDIRECT in the last 72 hours. ------------------------------------------------------------------------------------------------------------------ No results for input(s): TSH, T4TOTAL, T3FREE, THYROIDAB in the last 72 hours.  Invalid input(s): FREET3 ------------------------------------------------------------------------------------------------------------------ No results for input(s): VITAMINB12, FOLATE, FERRITIN, TIBC, IRON, RETICCTPCT in the last 72 hours.  Coagulation profile No results for input(s): INR, PROTIME in the last 168 hours.  No results for input(s): DDIMER in the last 72 hours.  Cardiac Enzymes No results for input(s): CKMB, TROPONINI, MYOGLOBIN in the last 168 hours.  Invalid input(s): CK ------------------------------------------------------------------------------------------------------------------ Invalid input(s): POCBNP   CBG:  Recent Labs Lab 10/11/15 1258 10/11/15 2118 10/12/15 0758  GLUCAP 98 171* 104*       Studies: Dg Chest Port 1 View  10/11/2015  CLINICAL DATA:  Cough and end-stage renal disease EXAM: PORTABLE CHEST 1 VIEW COMPARISON:  10/03/2015 FINDINGS: Cardiac shadow is mildly enlarged but stable. Postsurgical changes are again seen. Increased density is noted in the medial right lung base which may represent some early infiltrate. No sizable effusion is seen. IMPRESSION: Likely early infiltrate in the right medial lung base. Electronically Signed   By: Inez Catalina M.D.   On: 10/11/2015 08:51      Lab Results  Component Value Date   HGBA1C 5.8 06/06/2015   HGBA1C 6.3 03/06/2015   HGBA1C 4.7 11/27/2014   Lab Results  Component Value Date   CREATININE 16.35* 10/11/2015       Scheduled Meds: . aspirin  81 mg Oral Daily  . carvedilol  25 mg Oral  BID WC  . ceFEPime (MAXIPIME) IV  1 g Intravenous Q24H  . colchicine  0.6 mg Oral Daily  . [START ON 10/13/2015] darbepoetin (ARANESP) injection - DIALYSIS  100 mcg Intravenous Q Sat-HD  . [START ON 10/13/2015] doxercalciferol  2 mcg Intravenous Q T,Th,Sa-HD  . heparin  5,000 Units Subcutaneous 3 times per day  . insulin aspart  0-5 Units Subcutaneous QHS  . insulin aspart  0-9 Units Subcutaneous TID WC  . multivitamin  1 tablet Oral QHS  . oxymetazoline  1 spray Each Nare Once  . predniSONE  50 mg Oral Q breakfast  . repaglinide  0.5 mg Oral BID AC  . sucroferric oxyhydroxide  1,000 mg Oral TID WC   Continuous Infusions:   Principal Problem:   HCAP (healthcare-associated pneumonia) Active Problems:   Essential hypertension   Acute gout   End-stage renal failure with renal transplant ([place])   Diabetes mellitus type 2, controlled ([place])  Anemia in chronic kidney disease   Kidney transplant failure   Acute respiratory disease   CAP (community acquired pneumonia)    Time spent: 19 minutes   [facility] Pager [phone]. If 7PM-7AM, please contact night-coverage at [email or web address], password [email or web address] 10/12/2015, 11:15 AM

## 2015-10-12 NOTE — Care Management Obs Status (Signed)
[facility] NOTIFICATION   Patient Details  Name: Patrick [name] MRN: [id number] Date of Birth: [birth year]   Medicare Observation Status Notification Given:  Yes    Sharin Mons, RN 10/12/2015, 3:34 PM

## 2015-10-12 NOTE — Progress Notes (Signed)
[facility] Progress Note  Assessment/Plan: 1. HCAP - tmax 100.1; on Maxipime and Vanc; BC pending 2. ESRD - TTS - next HD Sat; pt refuses renal diet- advised to avoid^ K foods 3. Anemia -  hgb 12.9 isat - false high; initial was 11.3 down into 8s today; last outpt Hgb was 10.9 -; No overt reason for drop - check FOBT- redose ESA Sat; if hgb continues to trend down, need to recheck Fe studies on  Sat.- last ferritin in Jan 1300 down from 2000 and tsat 33% in Feb 4. Secondary hyperparathyroidism -  S/p parathyroidectomy cont hectorol; corrected Ca chronically low change to 2.5 cath bath- not sure why not on Ca CO3- on Fe based binder- had been noncompliant with po calcitriol post parathyroidectomy  5. HTN/volume - EDW recently lowered; continue to titrate down- kep even on Thursday-need to do standing weights; large discrepancy between wts here and outpt center 6. Nutrition - renal carb mod/vits/supp 7. DM - per primary 8. ? acute gout vs other - on pred - rapid taper, added colchicine; check MRI today  Myriam Jacobson, PA-C [place] 10/12/2015,9:13 AM   Renal Attending: We recently tapered him off steroids after, a failed renal transplant.  He lost weight and moons facies.  Now with PNA and prob gout.  Next HD in AM.  I agree with note above. Mega Kinkade C     Subjective:   Appetite is back!! Ate 100% breakfast  Objective Filed Vitals:   10/11/15 1937 10/11/15 2300 10/12/15 0300 10/12/15 0550  BP: 188/86 158/89  140/76  Pulse: 100 98  92  Temp: 98.8 F (37.1 C)   99.5 F (37.5 C)  TempSrc: Oral   Oral  Resp: 20   20  Height:      Weight:   87.952 kg (193 lb 14.4 oz)   SpO2: 100%   94%   Physical Exam General: dry cough  Heart: RRR Lungs: dim poor expansion Abdomen: obese NT Extremities: no LE edema;  Right hand, thenar area tender - puffy  Dialysis Access: right upper AGG + bruit   Dialysis Orders: Center: [facility]  TTS TueThuSat, 4 hrs 0 min, 180NRe Optiflux, BFR 400, DFR Manual 800 mL/min, EDW 102 (kg), Dialysate 2.0 K, 2.25 Ca, UFR Profile: Profile 4, Sodium Model: Linear, Access: RUA AV graft Heparin: 1600 units q treatment Mircera: 100 mg IV q 2 weeks (10/02/15) Hectoral: 2 mcg IV Q TTS (last dose 10/09/15)  Additional Objective Labs: Basic Metabolic Panel:  Recent Labs Lab 10/11/15 0831 10/11/15 1623  NA 135 133*  K 3.5 3.7  CL 94* 95*  CO2  --  22  GLUCOSE 92 101*  BUN 41* 46*  CREATININE 16.30* 16.35*  CALCIUM  --  6.5*  PHOS  --  5.2*   Liver Function Tests:  Recent Labs Lab 10/11/15 1623  ALBUMIN 2.7*   CBC:  Recent Labs Lab 10/11/15 0831 10/11/15 1621 10/12/15 0601  WBC 5.7 6.7 5.8  NEUTROABS 3.7  --   --   HGB 11.3*  12.9* 8.8* 8.5*  HCT 35.3*  38.0* 28.6* 27.8*  MCV 92.9 92.3 92.7  PLT 202 248 215  CBG:  Recent Labs Lab 10/11/15 1258 10/11/15 2118 10/12/15 0758  GLUCAP 98 171* 104*   Studies/Results: Dg Chest Port 1 View  10/11/2015  CLINICAL DATA:  Cough and end-stage renal disease EXAM: PORTABLE CHEST 1 VIEW COMPARISON:  10/03/2015 FINDINGS: Cardiac shadow is mildly enlarged but  stable. Postsurgical changes are again seen. Increased density is noted in the medial right lung base which may represent some early infiltrate. No sizable effusion is seen. IMPRESSION: Likely early infiltrate in the right medial lung base. Electronically Signed   By: Inez Catalina M.D.   On: 10/11/2015 08:51   Medications:   . aspirin  81 mg Oral Daily  . carvedilol  25 mg Oral BID WC  . ceFEPime (MAXIPIME) IV  1 g Intravenous Q24H  . [START ON 10/13/2015] doxercalciferol  2 mcg Intravenous Q T,Th,Sa-HD  . heparin  5,000 Units Subcutaneous 3 times per day  . insulin aspart  0-5 Units Subcutaneous QHS  . insulin aspart  0-9 Units Subcutaneous TID WC  . oxymetazoline  1 spray Each Nare Once  . predniSONE  50 mg Oral Q breakfast  . repaglinide  0.5 mg Oral BID AC  .  sucroferric oxyhydroxide  1,000 mg Oral TID WC

## 2015-10-12 NOTE — Progress Notes (Addendum)
Pharmacy Antibiotic Note  Patrick [name] is a 60 y.o. male admitted on 10/11/2015 with pneumonia.  Pharmacy has been consulted for vancomycin and cefepime dosing. He has ESRD and is on HD TTS. Last HD yesterday.  He was concerned he did not receive his antibiotics yesterday so I went and spoke with him. He was ordered vancomycin 2000 mg IV load and per [facility] was started prior to HD, stopped when he arrived to HD, and resumed in the last hr. After speaking with patient however, he tells me the vancomycin was never reconnected in the last hr of HD. Also, the load should have entirely infused prior to the patient going to HD anyway. I explained to patient we have 2 options: 1) check level now and redose if low or 2) check preHD level tomorrow and redose if low. He would like to check level today so I have ordered. He is on cefepime 1 g IV q24h and received a dose yesterday and today.  Plan: F/U vancomycin level and redose if low (post-HD 5-15 mcg/ml) Vancomycin 1000 mg after HD on TTS - to complete 8 days Change cefepime to 2 g IV after HD on TTS - to complete 8 days  Height: 5\' 6"  (167.6 cm) Weight: 193 lb 14.4 oz (87.952 kg) IBW/kg (Calculated) : 63.8  Temp (24hrs), Avg:99.1 F (37.3 C), Min:98.6 F (37 C), Max:100.1 F (37.8 C)   Recent Labs Lab 10/11/15 0831 10/11/15 1621 10/11/15 1623 10/12/15 0601  WBC 5.7 6.7  --  5.8  CREATININE 16.30*  --  16.35*  --     Estimated Creatinine Clearance: 4.9 mL/min (by C-G formula based on Cr of 16.35).    Allergies  Allergen Reactions  . Iodinated Diagnostic Agents Rash    Other Reaction: burning to mouth, swelling of lips.    Antimicrobials this admission: Cefepime 3/16 >>  Vancomycin 3/16 >>   Dose adjustments this admission: none  Microbiology results: 3/16 BCx: pending   Thank you for allowing pharmacy to be a part of this patient's care.  [place], Pharm.D., BCPS Clinical Pharmacist Pager: [phone] 10/12/2015 11:51  AM   Addendum: Random vancomycin level is 27. No additional vancomycin needed.  Echo, Pharm.D., BCPS Clinical Pharmacist Pager: [phone] 10/12/2015 12:56 PM  '

## 2015-10-12 NOTE — Progress Notes (Signed)
MD paged about vanc trough, lab value has been noted.

## 2015-10-13 DIAGNOSIS — J069 Acute upper respiratory infection, unspecified: Secondary | ICD-10-CM

## 2015-10-13 DIAGNOSIS — M1000 Idiopathic gout, unspecified site: Secondary | ICD-10-CM

## 2015-10-13 LAB — COMPREHENSIVE METABOLIC PANEL
ALBUMIN: 2.5 g/dL — AB (ref 3.5–5.0)
ALT: 12 U/L — ABNORMAL LOW (ref 17–63)
ANION GAP: 16 — AB (ref 5–15)
AST: 22 U/L (ref 15–41)
Alkaline Phosphatase: 87 U/L (ref 38–126)
BUN: 50 mg/dL — AB (ref 6–20)
CHLORIDE: 98 mmol/L — AB (ref 101–111)
CO2: 23 mmol/L (ref 22–32)
Calcium: 6.4 mg/dL — CL (ref 8.9–10.3)
Creatinine, Ser: 12.09 mg/dL — ABNORMAL HIGH (ref 0.61–1.24)
GFR calc Af Amer: 5 mL/min — ABNORMAL LOW (ref >60)
GFR, EST NON AFRICAN AMERICAN: 4 mL/min — AB (ref >60)
GLUCOSE: 219 mg/dL — AB (ref 65–99)
POTASSIUM: 3.7 mmol/L (ref 3.5–5.1)
Sodium: 137 mmol/L (ref 135–145)
Total Bilirubin: 0.3 mg/dL (ref 0.3–1.2)
Total Protein: 7.5 g/dL (ref 6.5–8.1)

## 2015-10-13 LAB — GLUCOSE, CAPILLARY
GLUCOSE-CAPILLARY: 80 mg/dL (ref 65–99)
Glucose-Capillary: 161 mg/dL — ABNORMAL HIGH (ref 65–99)
Glucose-Capillary: 182 mg/dL — ABNORMAL HIGH (ref 65–99)

## 2015-10-13 LAB — CBC
HCT: 29 % — ABNORMAL LOW (ref 39.0–52.0)
Hemoglobin: 8.8 g/dL — ABNORMAL LOW (ref 13.0–17.0)
MCH: 28.4 pg (ref 26.0–34.0)
MCHC: 30.3 g/dL (ref 30.0–36.0)
MCV: 93.5 fL (ref 78.0–100.0)
PLATELETS: 214 10*3/uL (ref 150–400)
RBC: 3.1 MIL/uL — ABNORMAL LOW (ref 4.22–5.81)
RDW: 17.3 % — AB (ref 11.5–15.5)
WBC: 5.1 10*3/uL (ref 4.0–10.5)

## 2015-10-13 LAB — MRSA PCR SCREENING: MRSA BY PCR: NEGATIVE

## 2015-10-13 LAB — VANCOMYCIN, RANDOM: Vancomycin Rm: 15 ug/mL

## 2015-10-13 MED ORDER — CALCIUM ACETATE (PHOS BINDER) 667 MG PO CAPS: 667.0000 mg | ORAL_CAPSULE | Freq: Three times a day (TID) | ORAL | Status: DC

## 2015-10-13 MED ORDER — LIDOCAINE HCL (PF) 1 % IJ SOLN: 5.0000 mL | INTRAMUSCULAR | Status: DC | PRN

## 2015-10-13 MED ORDER — CALCIUM ACETATE (PHOS BINDER) 667 MG PO CAPS
1334.0000 mg | ORAL_CAPSULE | Freq: Three times a day (TID) | ORAL | Status: DC
  Administered 2015-10-13 – 2015-10-14 (×2): 1334 mg via ORAL
  Filled 2015-10-13 (×5): qty 2

## 2015-10-13 MED ORDER — SODIUM CHLORIDE 0.9 % IV SOLN: 100.0000 mL | INTRAVENOUS | Status: DC | PRN

## 2015-10-13 MED ORDER — DOXERCALCIFEROL 4 MCG/2ML IV SOLN
INTRAVENOUS | Status: AC
  Administered 2015-10-13: 2 ug via INTRAVENOUS
  Filled 2015-10-13: qty 2

## 2015-10-13 MED ORDER — DARBEPOETIN ALFA 100 MCG/0.5ML IJ SOSY
PREFILLED_SYRINGE | INTRAMUSCULAR | Status: AC
  Administered 2015-10-13: 100 ug via INTRAVENOUS
  Filled 2015-10-13: qty 0.5

## 2015-10-13 MED ORDER — LIDOCAINE-PRILOCAINE 2.5-2.5 % EX CREA: 1.0000 "application " | TOPICAL_CREAM | CUTANEOUS | Status: DC | PRN

## 2015-10-13 MED ORDER — COLCHICINE 0.6 MG PO TABS: 0.6000 mg | ORAL_TABLET | ORAL | Status: DC

## 2015-10-13 MED ORDER — PENTAFLUOROPROP-TETRAFLUOROETH EX AERO: 1.0000 "application " | INHALATION_SPRAY | CUTANEOUS | Status: DC | PRN

## 2015-10-13 MED ORDER — VANCOMYCIN HCL IN DEXTROSE 1-5 GM/200ML-% IV SOLN: 1000.0000 mg | INTRAVENOUS | Status: DC

## 2015-10-13 MED ORDER — HEPARIN SODIUM (PORCINE) 1000 UNIT/ML DIALYSIS: 20.0000 [IU]/kg | INTRAMUSCULAR | Status: DC | PRN

## 2015-10-13 MED ORDER — ALTEPLASE 2 MG IJ SOLR: 2.0000 mg | Freq: Once | INTRAMUSCULAR | Status: DC | PRN

## 2015-10-13 MED ORDER — HEPARIN SODIUM (PORCINE) 1000 UNIT/ML DIALYSIS: 1000.0000 [IU] | INTRAMUSCULAR | Status: DC | PRN

## 2015-10-13 MED ORDER — CARVEDILOL 12.5 MG PO TABS
12.5000 mg | ORAL_TABLET | Freq: Two times a day (BID) | ORAL | Status: DC
  Administered 2015-10-13 – 2015-10-14 (×2): 12.5 mg via ORAL
  Filled 2015-10-13 (×3): qty 1

## 2015-10-13 MED ORDER — HEPARIN SODIUM (PORCINE) 1000 UNIT/ML DIALYSIS: 1600.0000 [IU] | Freq: Once | INTRAMUSCULAR | Status: DC

## 2015-10-13 NOTE — Progress Notes (Signed)
Pharmacy Antibiotic Note  Patrick [name] is a 60 y.o. male admitted on 10/11/2015 with pneumonia.  Pharmacy has been consulted for vancomycin and cefepime dosing. He has ESRD and is on HD TTS. Last HD today. Post HD level 15 (goal to 5 - 15).  Plan: Vancomycin 1000 mg after HD on TTS (beginning Tuesday since pt on high end of range) - to complete 8 days Change cefepime to 2 g IV after HD on TTS - to complete 8 days  Height: 5\' 6"  (167.6 cm) Weight: 220 lb 14.4 oz (100.2 kg) (Standing scale) IBW/kg (Calculated) : 63.8  Temp (24hrs), Avg:97.6 F (36.4 C), Min:97.5 F (36.4 C), Max:97.8 F (36.6 C)   Recent Labs Lab 10/11/15 0831 10/11/15 1621 10/11/15 1623 10/12/15 0601 10/12/15 1206 10/13/15 0737 10/13/15 1500  WBC 5.7 6.7  --  5.8  --  5.1  --   CREATININE 16.30*  --  16.35*  --   --  12.09*  --   VANCOTROUGH  --   --   --   --  27*  --   --   VANCORANDOM  --   --   --   --   --   --  15    Estimated Creatinine Clearance: 7.1 mL/min (by C-G formula based on Cr of 12.09).    Allergies  Allergen Reactions  . Iodinated Diagnostic Agents Rash    Other Reaction: burning to mouth, swelling of lips.    Antimicrobials this admission: Cefepime 3/16 >>  Vancomycin 3/16 >>   Dose adjustments this admission: Post-HD level 15 (3/18)  Microbiology results: 3/16 BCx: pending   Levester Fresh, PharmD, BCPS, [facility] Clinical Pharmacist Pager [phone] 10/13/2015 4:49 PM    '

## 2015-10-13 NOTE — Procedures (Signed)
Soft BP on HD.  Calcium down to 6.5, albumin 2.7. On 2.5 Calcium bath and Hectoral.  Will use calcium based binder. Will reduce Coreg dose.  Pain in wrist less and coughing less. Patrick [name]

## 2015-10-13 NOTE — Progress Notes (Signed)
[facility] PROGRESS NOTE  Patrick [name] [id number] DOB: 17-Jun-1956 DOA: 10/11/2015 PCP: Elyn Peers, MD  Length of stay: 1   Assessment/Plan: Principal Problem:   HCAP (healthcare-associated pneumonia) Active Problems:   Essential hypertension   Acute gout   End-stage renal failure with renal transplant ([place])   Diabetes mellitus type 2, controlled ([place])   Anemia in chronic kidney disease   Kidney transplant failure   Acute respiratory disease   CAP (community acquired pneumonia)   HPI: 60 year old male patient previous renal transplant that has failed now back on dialysis 1 year, history of gout, diabetes on Prandin, hypertension, anemia of chronic kidney disease presents to the hospital complaining of abrupt onset of left hand pain with swelling. Patient reports symptoms began abruptly last night. Significant pain involving the right wrist as well as the thenar area at the base of the thumb. Of note patient has recently been treated for upper respiratory infection. He was seen in the ER and was given a Z-Pak which he completed yesterday. He has not had any fevers or chills for more than 48 hours. His sputum has improved from yellow productive to clear. He does report some dyspnea on exertion. Because of significant pain in the hand with associated swelling in the dialysis access arm he was sent from dialysis to the ER for evaluation    Assessment & Plan     HCAP -Patient has completed outpatient Zithromax with persistent respiratory symptoms and ongoing fatigue with some minor dyspnea on exertion; has focal infiltrate which is new compared with chest x-ray one week prior and given patient on chronic steroids and dialysis will treat as healthcare acquired etiology Continue telemetry-Normal sinus -Currently 100% on 2 L, wean  oxygen -Empiric antibiotics with vancomycin and Maxipime -Follow-up on blood cultures     Acute gout/right hand and wrist swelling and  pain Continue colchicine and prednisone, uric acid 7.8 -Holding preadmission prednisone 5 mg in favor of 50 mg by mouth daily with likely rapid taper if this treatment demonstrates improvement in symptomatology -If after above symptoms have not significantly improved, consider pain and effusion potentially related to infectious etiology and recommend consult orthopedics/IR for aspiration MRI of the right hand shows subcutaneous edema, no abscess, degenerative arthropathy and may benefit from outpatient hand surgery consultation   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  -Has been back on dialysis 1 year -Previous Prograf and CellCept tapered and discontinued over the past year remains on low-dose prednisone 5 mg daily Nephrology following     Diabetes mellitus with renal manifestations, controlled -Has transitioned from insulin to Prandin -Monitor for hypoglycemia in setting of high dose pulse steroids -Monitor CBGs -SSI -HgbA1c 5.4   Essential hypertension/Grade 1 Diastolic Dysfunction -Currently controlled -Continue carvedilol -Last echo was in 2015 and had no associated aortic stenosis or pulmonary hypertension although did have mild pulmonic valve regurgitation   Anemia in chronic kidney disease -Hemoglobin stable and at baseline of around 11 , Currently 8.5, no overt bleeding    DVT prophylaxsis heparin  Code Status:      Code Status Orders        Start     Ordered   10/11/15 1305  Full code   Continuous     10/11/15 1304     Family Communication: Discussed in detail with the patient, all imaging results, lab results explained to the patient   Disposition Plan: Anticipate discharge on Monday, continue IV antibiotics for now  with dialysis     Consultants:  Nephrology    Procedures:  None  Antibiotics: Anti-infectives    Start     Dose/Rate Route Frequency Ordered Stop   10/16/15 1200  vancomycin (VANCOCIN) IVPB 1000 mg/200 mL  premix     1,000 mg 200 mL/hr over 60 Minutes Intravenous Every T-Th-Sa (Hemodialysis) 10/13/15 0818     10/13/15 1800  ceFEPIme (MAXIPIME) 2 g in dextrose 5 % 50 mL IVPB     2 g 100 mL/hr over 30 Minutes Intravenous Every T-Th-Sa (1800) 10/12/15 1158 10/20/15 1759   10/13/15 1200  vancomycin (VANCOCIN) IVPB 1000 mg/200 mL premix  Status:  Discontinued     1,000 mg 200 mL/hr over 60 Minutes Intravenous Every T-Th-Sa (Hemodialysis) 10/12/15 1158 10/13/15 0818   10/11/15 1045  vancomycin (VANCOCIN) 2,000 mg in sodium chloride 0.9 % 500 mL IVPB     2,000 mg 250 mL/hr over 120 Minutes Intravenous  Once 10/11/15 1032 10/11/15 1500   10/11/15 1030  ceFEPIme (MAXIPIME) 1 g in dextrose 5 % 50 mL IVPB  Status:  Discontinued     1 g 100 mL/hr over 30 Minutes Intravenous Every 24 hours 10/11/15 1013 10/12/15 1158   10/11/15 1030  vancomycin (VANCOCIN) 2,250 mg in sodium chloride 0.9 % 500 mL IVPB  Status:  Discontinued     2,250 mg 250 mL/hr over 120 Minutes Intravenous  Once 10/11/15 1025 10/11/15 1032   10/11/15 1015  cefTRIAXone (ROCEPHIN) 1 g in dextrose 5 % 50 mL IVPB  Status:  Discontinued     1 g 100 mL/hr over 30 Minutes Intravenous  Once 10/11/15 1001 10/11/15 1012   10/11/15 1015  azithromycin (ZITHROMAX) 500 mg in dextrose 5 % 250 mL IVPB  Status:  Discontinued     500 mg 250 mL/hr over 60 Minutes Intravenous  Once 10/11/15 1001 10/11/15 1012         HPI/Subjective: Continues to have swelling of the right hand, afebrile overnight, blood pressure soft  Objective: Filed Vitals:   10/13/15 1000 10/13/15 1030 10/13/15 1100 10/13/15 1126  BP: 110/67 107/65 102/67 94/61  Pulse: 76 76 56 56  Temp:    97.8 F (36.6 C)  TempSrc:    Oral  Resp: 21 20 17 18   Height:      Weight:    100.2 kg (220 lb 14.4 oz)  SpO2:    100%    Intake/Output Summary (Last 24 hours) at 10/13/15 1155 Last data filed at 10/13/15 1126  Gross per 24 hour  Intake      0 ml  Output   2000 ml  Net   -2000 ml    Exam:  General: No acute respiratory distress Lungs: Clear to auscultation bilaterally without wheezes or crackles Cardiovascular: Regular rate and rhythm without murmur gallop or rub normal S1 and S2 Abdomen: Nontender, nondistended, soft, bowel sounds positive, no rebound, no ascites, no appreciable mass Extremities: Symmetrical without obvious trauma or injury, focal edema consistent with diffusion involving the right hand primarily from the wrist to the right thumb with focal tenderness at PIP (base) of thumb-no erythema     Data Review   Micro Results Recent Results (from the past 240 hour(s))  Culture, blood (Routine X 2) w Reflex to ID Panel     Status: None (Preliminary result)   Collection Time: 10/11/15  8:19 AM  Result Value Ref Range Status   Specimen Description BLOOD LEFT ANTECUBITAL  Final   Special Requests  BOTTLES DRAWN AEROBIC AND ANAEROBIC 5CC  Final   Culture NO GROWTH 1 DAY  Final   Report Status PENDING  Incomplete  MRSA PCR Screening     Status: None   Collection Time: 10/12/15 11:20 PM  Result Value Ref Range Status   MRSA by PCR NEGATIVE NEGATIVE Final    Comment:        The GeneXpert MRSA Assay (FDA approved for NASAL specimens only), is one component of a comprehensive MRSA colonization surveillance program. It is not intended to diagnose MRSA infection nor to guide or monitor treatment for MRSA infections.     Radiology Reports Dg Chest 2 View  10/03/2015  CLINICAL DATA:  Persistent dry cough.  Chest congestion, fever. EXAM: CHEST  2 VIEW COMPARISON:  09/14/2014 FINDINGS: Prior CABG. Cardiomegaly. No confluent opacities, effusions or edema. No acute bony abnormality. IMPRESSION: Cardiomegaly.  No active disease. Electronically Signed   By: Rolm Baptise M.D.   On: 10/03/2015 07:50   Mr Hand Right Wo Contrast  10/13/2015  CLINICAL DATA:  Abrupt onset right hand pain and swelling. History of go and renal failure. Pain especially in  the wrist in the thenar area of the thumb. EXAM: MRI OF THE RIGHT HAND WITHOUT CONTRAST TECHNIQUE: Multiplanar, multisequence MR imaging was performed. No intravenous contrast was administered. COMPARISON:  None. FINDINGS: Despite efforts by the technologist and patient, motion artifact is present on today's exam and could not be eliminated. This reduces exam sensitivity and specificity. There is diffuse subcutaneous edema in the hand and wrist. Abnormal edema also tracks within the regional muscular structures diffusely although most notably in the abductor pollicis brevis, opponens pollicis, and flexor pollicis brevis. No disproportionate edema in the carpal tunnel. There is arthropathy of the first carpometacarpal articulation with associated spurring which may be partially fragmented along the base of the first metacarpal, a degenerative subcortical focus of edema laterally in the trapezium, and considerable spurring along the medial distal margin of the trapezium. There is a joint effusion as well as laxity and tear of the anterior oblique ligament of the first [facility] joint shown on images 12-14 of series 4, with poor definition of the attachment of the anterior oblique ligament to the first metacarpal. This allows for radial subluxation of the first metacarpal. Mild degenerative findings at the articulation between the scaphoid and the trapezium. IMPRESSION: 1. Diffuse subcutaneous edema in the hands. Cellulitis is not excluded. Complex regional pain syndrome might also be considered along with potentially vascular causes. 2. There is degenerative arthropathy at the first carpometacarpal articulation along with a tear of the first metacarpal attachment of the anterior oblique ligament allowing for radial subluxation of the first metacarpal. A tear of the intermetacarpal ligament may also be implied by the radial subluxation. Electronically Signed   By: Van Clines M.D.   On: 10/13/2015 07:55   Dg Chest  Port 1 View  10/11/2015  CLINICAL DATA:  Cough and end-stage renal disease EXAM: PORTABLE CHEST 1 VIEW COMPARISON:  10/03/2015 FINDINGS: Cardiac shadow is mildly enlarged but stable. Postsurgical changes are again seen. Increased density is noted in the medial right lung base which may represent some early infiltrate. No sizable effusion is seen. IMPRESSION: Likely early infiltrate in the right medial lung base. Electronically Signed   By: Inez Catalina M.D.   On: 10/11/2015 08:51     CBC  Recent Labs Lab 10/11/15 0831 10/11/15 1621 10/12/15 0601 10/13/15 0737  WBC 5.7 6.7 5.8 5.1  HGB  11.3*  12.9* 8.8* 8.5* 8.8*  HCT 35.3*  38.0* 28.6* 27.8* 29.0*  PLT 202 248 215 214  MCV 92.9 92.3 92.7 93.5  MCH 29.7 28.4 28.3 28.4  MCHC 32.0 30.8 30.6 30.3  RDW 17.7* 17.6* 17.6* 17.3*  LYMPHSABS 1.3  --   --   --   MONOABS 0.5  --   --   --   EOSABS 0.2  --   --   --   BASOSABS 0.0  --   --   --     Chemistries   Recent Labs Lab 10/11/15 0831 10/11/15 1623 10/13/15 0737  NA 135 133* 137  K 3.5 3.7 3.7  CL 94* 95* 98*  CO2  --  22 23  GLUCOSE 92 101* 219*  BUN 41* 46* 50*  CREATININE 16.30* 16.35* 12.09*  CALCIUM  --  6.5* 6.4*  AST  --   --  22  ALT  --   --  12*  ALKPHOS  --   --  87  BILITOT  --   --  0.3   ------------------------------------------------------------------------------------------------------------------ estimated creatinine clearance is 7.1 mL/min (by C-G formula based on Cr of 12.09). ------------------------------------------------------------------------------------------------------------------  Recent Labs  10/11/15 1517  HGBA1C 5.4   ------------------------------------------------------------------------------------------------------------------ No results for input(s): CHOL, HDL, LDLCALC, TRIG, CHOLHDL, LDLDIRECT in the last 72  hours. ------------------------------------------------------------------------------------------------------------------ No results for input(s): TSH, T4TOTAL, T3FREE, THYROIDAB in the last 72 hours.  Invalid input(s): FREET3 ------------------------------------------------------------------------------------------------------------------ No results for input(s): VITAMINB12, FOLATE, FERRITIN, TIBC, IRON, RETICCTPCT in the last 72 hours.  Coagulation profile No results for input(s): INR, PROTIME in the last 168 hours.  No results for input(s): DDIMER in the last 72 hours.  Cardiac Enzymes No results for input(s): CKMB, TROPONINI, MYOGLOBIN in the last 168 hours.  Invalid input(s): CK ------------------------------------------------------------------------------------------------------------------ Invalid input(s): POCBNP   CBG:  Recent Labs Lab 10/11/15 2118 10/12/15 0758 10/12/15 1157 10/12/15 1727 10/12/15 2250  GLUCAP 171* 104* 126* 168* 169*       Studies: Mr Hand Right Wo Contrast  10/13/2015  CLINICAL DATA:  Abrupt onset right hand pain and swelling. History of go and renal failure. Pain especially in the wrist in the thenar area of the thumb. EXAM: MRI OF THE RIGHT HAND WITHOUT CONTRAST TECHNIQUE: Multiplanar, multisequence MR imaging was performed. No intravenous contrast was administered. COMPARISON:  None. FINDINGS: Despite efforts by the technologist and patient, motion artifact is present on today's exam and could not be eliminated. This reduces exam sensitivity and specificity. There is diffuse subcutaneous edema in the hand and wrist. Abnormal edema also tracks within the regional muscular structures diffusely although most notably in the abductor pollicis brevis, opponens pollicis, and flexor pollicis brevis. No disproportionate edema in the carpal tunnel. There is arthropathy of the first carpometacarpal articulation with associated spurring which may be  partially fragmented along the base of the first metacarpal, a degenerative subcortical focus of edema laterally in the trapezium, and considerable spurring along the medial distal margin of the trapezium. There is a joint effusion as well as laxity and tear of the anterior oblique ligament of the first [facility] joint shown on images 12-14 of series 4, with poor definition of the attachment of the anterior oblique ligament to the first metacarpal. This allows for radial subluxation of the first metacarpal. Mild degenerative findings at the articulation between the scaphoid and the trapezium. IMPRESSION: 1. Diffuse subcutaneous edema in the hands. Cellulitis is not excluded. Complex regional pain syndrome might also be  considered along with potentially vascular causes. 2. There is degenerative arthropathy at the first carpometacarpal articulation along with a tear of the first metacarpal attachment of the anterior oblique ligament allowing for radial subluxation of the first metacarpal. A tear of the intermetacarpal ligament may also be implied by the radial subluxation. Electronically Signed   By: Van Clines M.D.   On: 10/13/2015 07:55      Lab Results  Component Value Date   HGBA1C 5.4 10/11/2015   HGBA1C 5.8 06/06/2015   HGBA1C 6.3 03/06/2015   Lab Results  Component Value Date   CREATININE 12.09* 10/13/2015       Scheduled Meds: . aspirin  81 mg Oral Daily  . calcium acetate  1,334 mg Oral TID WC  . carvedilol  12.5 mg Oral BID WC  . ceFEPime (MAXIPIME) IV  2 g Intravenous Q T,Th,Sat-1800  . colchicine  0.6 mg Oral Daily  . darbepoetin (ARANESP) injection - DIALYSIS  100 mcg Intravenous Q Sat-HD  . doxercalciferol  2 mcg Intravenous Q T,Th,Sa-HD  . heparin  5,000 Units Subcutaneous 3 times per day  . insulin aspart  0-5 Units Subcutaneous QHS  . insulin aspart  0-9 Units Subcutaneous TID WC  . multivitamin  1 tablet Oral QHS  . oxymetazoline  1 spray Each Nare Once  . predniSONE   50 mg Oral Q breakfast  . repaglinide  0.5 mg Oral BID AC  . [START ON 10/16/2015] vancomycin  1,000 mg Intravenous Q T,Th,Sa-HD   Continuous Infusions:   Principal Problem:   HCAP (healthcare-associated pneumonia) Active Problems:   Essential hypertension   Acute gout   End-stage renal failure with renal transplant ([place])   Diabetes mellitus type 2, controlled ([place])   Anemia in chronic kidney disease   Kidney transplant failure   Acute respiratory disease   CAP (community acquired pneumonia)    Time spent: 22 minutes   [facility] Pager [phone]. If 7PM-7AM, please contact night-coverage at [email or web address], password [facility] 10/13/2015, 11:55 AM  LOS: 1 day

## 2015-10-13 NOTE — Progress Notes (Signed)
0830 Received critical lab value Calcium 6.4.  Dr. Florene Glen aware, no new orders received. Will continue to monitor patient.

## 2015-10-14 LAB — GLUCOSE, CAPILLARY: Glucose-Capillary: 129 mg/dL — ABNORMAL HIGH (ref 65–99)

## 2015-10-14 MED ORDER — COLCHICINE 0.6 MG PO TABS: 0.6000 mg | ORAL_TABLET | ORAL | Status: DC

## 2015-10-14 MED ORDER — AMOXICILLIN-POT CLAVULANATE 500-125 MG PO TABS: 1.0000 | ORAL_TABLET | Freq: Three times a day (TID) | ORAL | Status: DC

## 2015-10-14 MED ORDER — HYDROCODONE-ACETAMINOPHEN 5-325 MG PO TABS: 1.0000 | ORAL_TABLET | Freq: Four times a day (QID) | ORAL | Status: DC | PRN

## 2015-10-14 MED ORDER — PREDNISONE 50 MG PO TABS: 50.0000 mg | ORAL_TABLET | Freq: Every day | ORAL | Status: DC

## 2015-10-14 NOTE — Discharge Summary (Deleted)
Physician Discharge Summary  Princeton Nabor MRN: [id number] DOB/AGE: [birth year] 60 y.o.  PCP: Elyn Peers, MD   Admit date: 10/11/2015 Discharge date: 10/14/2015  Discharge Diagnoses:     Principal Problem:   HCAP (healthcare-associated pneumonia) Active Problems:   Essential hypertension   Acute gout   End-stage renal failure with renal transplant ([place])   Diabetes mellitus type 2, controlled ([place])   Anemia in chronic kidney disease   Kidney transplant failure   Acute respiratory disease   CAP (community acquired pneumonia)    Follow-up recommendations Follow-up with PCP in 3-5 days , including all  additional recommended appointments as below Follow-up CBC, CMP in 3-5 days Patient would benefit from outpatient hand surgery consultation for his degenerative arthropathy of the right hand     Medication List    TAKE these medications        amoxicillin-clavulanate 500-125 MG tablet  Commonly known as:  AUGMENTIN  Take 1 tablet (500 mg total) by mouth 3 (three) times daily.     aspirin 81 MG tablet  Take 81 mg by mouth daily.     calcitRIOL 0.5 MCG capsule  Commonly known as:  ROCALTROL  Take 3 capsules (1.5 mcg total) by mouth daily.     carvedilol 25 MG tablet  Commonly known as:  COREG  Take 25 mg by mouth 2 (two) times daily with a meal.     colchicine 0.6 MG tablet  Take 1 tablet (0.6 mg total) by mouth every 14 (fourteen) days.  Start taking on:  10/27/2015     HYDROcodone-acetaminophen 5-325 MG tablet  Commonly known as:  NORCO/VICODIN  Take 1-2 tablets by mouth every 6 (six) hours as needed for moderate pain.     predniSONE 50 MG tablet  Commonly known as:  DELTASONE  Take 1 tablet (50 mg total) by mouth daily with breakfast.     repaglinide 0.5 MG tablet  Commonly known as:  PRANDIN  Take 1 tablet (0.5 mg total) by mouth 2 (two) times daily before a meal.         Discharge Condition: Stable  Discharge Instructions Get Medicines reviewed and  adjusted: Please take all your medications with you for your next visit with your Primary MD  Please request your Primary MD to go over all hospital tests and procedure/radiological results at the follow up, please ask your Primary MD to get all Hospital records sent to his/her office.  If you experience worsening of your admission symptoms, develop shortness of breath, life threatening emergency, suicidal or homicidal thoughts you must seek medical attention immediately by calling 911 or calling your MD immediately if symptoms less severe.  You must read complete instructions/literature along with all the possible adverse reactions/side effects for all the Medicines you take and that have been prescribed to you. Take any new Medicines after you have completely understood and accpet all the possible adverse reactions/side effects.   Do not drive when taking Pain medications.   Do not take more than prescribed Pain, Sleep and Anxiety Medications  Special Instructions: If you have smoked or chewed Tobacco in the last 2 yrs please stop smoking, stop any regular Alcohol and or any Recreational drug use.  Wear Seat belts while driving.  Please note  You were cared for by a hospitalist during your hospital stay. Once you are discharged, your primary care physician will handle any further medical issues. Please note that NO REFILLS for any discharge medications will be authorized once you  are discharged, as it is imperative that you return to your primary care physician (or establish a relationship with a primary care physician if you do not have one) for your aftercare needs so that they can reassess your need for medications and monitor your lab values.      Discharge Instructions    Diet - low sodium heart healthy    Complete by:  As directed      Diet - low sodium heart healthy    Complete by:  As directed      Increase activity slowly    Complete by:  As directed      Increase activity  slowly    Complete by:  As directed            Allergies  Allergen Reactions  . Iodinated Diagnostic Agents Rash    Other Reaction: burning to mouth, swelling of lips.      Disposition: 01-Home or Self Care   Consults:  Nephrology     Significant Diagnostic Studies:  Dg Chest 2 View  10/03/2015  CLINICAL DATA:  Persistent dry cough.  Chest congestion, fever. EXAM: CHEST  2 VIEW COMPARISON:  09/14/2014 FINDINGS: Prior CABG. Cardiomegaly. No confluent opacities, effusions or edema. No acute bony abnormality. IMPRESSION: Cardiomegaly.  No active disease. Electronically Signed   By: Rolm Baptise M.D.   On: 10/03/2015 07:50   Mr Hand Right Wo Contrast  10/13/2015  CLINICAL DATA:  Abrupt onset right hand pain and swelling. History of go and renal failure. Pain especially in the wrist in the thenar area of the thumb. EXAM: MRI OF THE RIGHT HAND WITHOUT CONTRAST TECHNIQUE: Multiplanar, multisequence MR imaging was performed. No intravenous contrast was administered. COMPARISON:  None. FINDINGS: Despite efforts by the technologist and patient, motion artifact is present on today's exam and could not be eliminated. This reduces exam sensitivity and specificity. There is diffuse subcutaneous edema in the hand and wrist. Abnormal edema also tracks within the regional muscular structures diffusely although most notably in the abductor pollicis brevis, opponens pollicis, and flexor pollicis brevis. No disproportionate edema in the carpal tunnel. There is arthropathy of the first carpometacarpal articulation with associated spurring which may be partially fragmented along the base of the first metacarpal, a degenerative subcortical focus of edema laterally in the trapezium, and considerable spurring along the medial distal margin of the trapezium. There is a joint effusion as well as laxity and tear of the anterior oblique ligament of the first [facility] joint shown on images 12-14 of series 4, with poor  definition of the attachment of the anterior oblique ligament to the first metacarpal. This allows for radial subluxation of the first metacarpal. Mild degenerative findings at the articulation between the scaphoid and the trapezium. IMPRESSION: 1. Diffuse subcutaneous edema in the hands. Cellulitis is not excluded. Complex regional pain syndrome might also be considered along with potentially vascular causes. 2. There is degenerative arthropathy at the first carpometacarpal articulation along with a tear of the first metacarpal attachment of the anterior oblique ligament allowing for radial subluxation of the first metacarpal. A tear of the intermetacarpal ligament may also be implied by the radial subluxation. Electronically Signed   By: Van Clines M.D.   On: 10/13/2015 07:55   Dg Chest Port 1 View  10/11/2015  CLINICAL DATA:  Cough and end-stage renal disease EXAM: PORTABLE CHEST 1 VIEW COMPARISON:  10/03/2015 FINDINGS: Cardiac shadow is mildly enlarged but stable. Postsurgical changes are again seen. Increased density  is noted in the medial right lung base which may represent some early infiltrate. No sizable effusion is seen. IMPRESSION: Likely early infiltrate in the right medial lung base. Electronically Signed   By: Inez Catalina M.D.   On: 10/11/2015 08:51        Filed Weights   10/13/15 0658 10/13/15 1126 10/14/15 0507  Weight: 102.7 kg (226 lb 6.6 oz) 100.2 kg (220 lb 14.4 oz) 102.286 kg (225 lb 8 oz)     Microbiology: Recent Results (from the past 240 hour(s))  Culture, blood (Routine X 2) w Reflex to ID Panel     Status: None (Preliminary result)   Collection Time: 10/11/15  8:19 AM  Result Value Ref Range Status   Specimen Description BLOOD LEFT ANTECUBITAL  Final   Special Requests BOTTLES DRAWN AEROBIC AND ANAEROBIC 5CC  Final   Culture NO GROWTH 2 DAYS  Final   Report Status PENDING  Incomplete  Culture, blood (Routine X 2) w Reflex to ID Panel     Status: None  (Preliminary result)   Collection Time: 10/11/15 11:45 PM  Result Value Ref Range Status   Specimen Description BLOOD LEFT HAND  Final   Special Requests BOTTLES DRAWN AEROBIC AND ANAEROBIC 5ML  Final   Culture NO GROWTH 1 DAY  Final   Report Status PENDING  Incomplete  MRSA PCR Screening     Status: None   Collection Time: 10/12/15 11:20 PM  Result Value Ref Range Status   MRSA by PCR NEGATIVE NEGATIVE Final    Comment:        The GeneXpert MRSA Assay (FDA approved for NASAL specimens only), is one component of a comprehensive MRSA colonization surveillance program. It is not intended to diagnose MRSA infection nor to guide or monitor treatment for MRSA infections.        Blood Culture    Component Value Date/Time   SDES BLOOD LEFT HAND 10/11/2015 2345   SPECREQUEST BOTTLES DRAWN AEROBIC AND ANAEROBIC 5ML 10/11/2015 2345   CULT NO GROWTH 1 DAY 10/11/2015 2345   REPTSTATUS PENDING 10/11/2015 2345      Labs: Results for orders placed or performed during the hospital encounter of 10/11/15 (from the past 48 hour(s))  Glucose, capillary     Status: Abnormal   Collection Time: 10/12/15 11:57 AM  Result Value Ref Range   Glucose-Capillary 126 (H) 65 - 99 mg/dL  Vancomycin, trough     Status: Abnormal   Collection Time: 10/12/15 12:06 PM  Result Value Ref Range   Vancomycin Tr 27 (HH) 10.0 - 20.0 ug/mL    Comment: CRITICAL RESULT CALLED TO, READ BACK BY AND VERIFIED WITH: K.BRUMAGIN,RN 10/12/15 1246 BY BSLADE   Glucose, capillary     Status: Abnormal   Collection Time: 10/12/15  5:27 PM  Result Value Ref Range   Glucose-Capillary 168 (H) 65 - 99 mg/dL  Glucose, capillary     Status: Abnormal   Collection Time: 10/12/15 10:50 PM  Result Value Ref Range   Glucose-Capillary 169 (H) 65 - 99 mg/dL  MRSA PCR Screening     Status: None   Collection Time: 10/12/15 11:20 PM  Result Value Ref Range   MRSA by PCR NEGATIVE NEGATIVE    Comment:        The GeneXpert MRSA  Assay (FDA approved for NASAL specimens only), is one component of a comprehensive MRSA colonization surveillance program. It is not intended to diagnose MRSA infection nor to guide or monitor treatment  for MRSA infections.   CBC     Status: Abnormal   Collection Time: 10/13/15  7:37 AM  Result Value Ref Range   WBC 5.1 4.0 - 10.5 K/uL   RBC 3.10 (L) 4.22 - 5.81 MIL/uL   Hemoglobin 8.8 (L) 13.0 - 17.0 g/dL   HCT 29.0 (L) 39.0 - 52.0 %   MCV 93.5 78.0 - 100.0 fL   MCH 28.4 26.0 - 34.0 pg   MCHC 30.3 30.0 - 36.0 g/dL   RDW 17.3 (H) 11.5 - 15.5 %   Platelets 214 150 - 400 K/uL  Comprehensive metabolic panel     Status: Abnormal   Collection Time: 10/13/15  7:37 AM  Result Value Ref Range   Sodium 137 135 - 145 mmol/L   Potassium 3.7 3.5 - 5.1 mmol/L   Chloride 98 (L) 101 - 111 mmol/L   CO2 23 22 - 32 mmol/L   Glucose, Bld 219 (H) 65 - 99 mg/dL   BUN 50 (H) 6 - 20 mg/dL   Creatinine, Ser 12.09 (H) 0.61 - 1.24 mg/dL   Calcium 6.4 (LL) 8.9 - 10.3 mg/dL    Comment: CRITICAL RESULT CALLED TO, READ BACK BY AND VERIFIED WITHRudie Meyer RN AT 2505 10/13/15 BY WOOLLENK    Total Protein 7.5 6.5 - 8.1 g/dL   Albumin 2.5 (L) 3.5 - 5.0 g/dL   AST 22 15 - 41 U/L   ALT 12 (L) 17 - 63 U/L   Alkaline Phosphatase 87 38 - 126 U/L   Total Bilirubin 0.3 0.3 - 1.2 mg/dL   GFR calc non Af Amer 4 (L) >60 mL/min   GFR calc Af Amer 5 (L) >60 mL/min    Comment: (NOTE) The eGFR has been calculated using the CKD EPI equation. This calculation has not been validated in all clinical situations. eGFR's persistently <60 mL/min signify possible Chronic Kidney Disease.    Anion gap 16 (H) 5 - 15  Glucose, capillary     Status: None   Collection Time: 10/13/15 12:33 PM  Result Value Ref Range   Glucose-Capillary 80 65 - 99 mg/dL  Vancomycin, random     Status: None   Collection Time: 10/13/15  3:00 PM  Result Value Ref Range   Vancomycin Rm 15 ug/mL    Comment:        Random Vancomycin  therapeutic range is dependent on dosage and time of specimen collection. A peak range is 20.0-40.0 ug/mL A trough range is 5.0-15.0 ug/mL          Glucose, capillary     Status: Abnormal   Collection Time: 10/13/15  5:46 PM  Result Value Ref Range   Glucose-Capillary 161 (H) 65 - 99 mg/dL  Glucose, capillary     Status: Abnormal   Collection Time: 10/13/15  9:41 PM  Result Value Ref Range   Glucose-Capillary 182 (H) 65 - 99 mg/dL   Comment 1 Notify RN    Comment 2 Document in Chart   Glucose, capillary     Status: Abnormal   Collection Time: 10/14/15  8:24 AM  Result Value Ref Range   Glucose-Capillary 129 (H) 65 - 99 mg/dL     Lipid Panel  No results found for: CHOL, TRIG, HDL, CHOLHDL, VLDL, LDLCALC, LDLDIRECT   Lab Results  Component Value Date   HGBA1C 5.4 10/11/2015   HGBA1C 5.8 06/06/2015   HGBA1C 6.3 03/06/2015     Lab Results  Component Value Date   CREATININE  12.09* 10/13/2015     HPI :  60 y.o. male with ESRD on hemodialysis TTS at [facility]. PMH significant for renal transplant ([facility] 2008) with acute rejection episode 2015 . He restarted hemodialysis 09/15/2014, DMT1, hypertension, gout, secondary hyperparathyroidism, anemia of chronic disease. He presented to HD clinic for scheduled dialysis C/O pain in R forearm. Was sent to ED for evaluation. Was seen in ED upon arrival, RFA was exquisitely tender to touch, warm to touch. UE dopplers ordered were negative for DVT, RUA AVF appeared patent. Patient has history of gout and has recently been treated for URI symptoms-just finished Z-pack. He did C/O mild dyspnea on exertion. CXR revealed Likely early infiltrate in the right medial lung base. WBC 5.7 Hgb 11.3 Na 135 K+ 3.5 BUN 41 Scr 16.3. He is being admitted with gout of right  Wrist and possible HCAP.   HOSPITAL COURSE: * HCAP -Patient   completed outpatient Zithromax with persistent respiratory symptoms and ongoing fatigue with some  minor dyspnea on exertion Prior to admission;Found to have focal infiltrate which is new compared with chest x-ray one week prior and given patient on chronic steroids and dialysis will treat as healthcare acquired etiology Admitted to telemetry Screened for home oxygen requirements prior to discharge -Treated with Empiric antibiotics with vancomycin and Maxipime Blood cultures no growth so far, patient switched to Augmentin 5 more days    Acute gout/right hand and wrist swelling and pain Started on colchicine and prednisone, uric acid 7.8. 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  -Has been back on dialysis 1 year -Previous Prograf and CellCept tapered and discontinued over the past year remains on low-dose prednisone 5 mg daily Nephrology following    Diabetes mellitus with renal manifestations, controlled -Has transitioned from insulin to Prandin -Monitor for hypoglycemia in setting of high dose pulse steroids -Monitor CBGs -SSI -HgbA1c 5.4   Essential hypertension/Grade 1 Diastolic Dysfunction -Currently controlled -Continue carvedilol -Last echo was in 2015 and had no associated aortic stenosis or pulmonary hypertension although did have mild pulmonic valve regurgitation   Anemia in chronic kidney disease -Hemoglobin stable and at baseline of around 11 , Currently 8.5, no overt bleeding    Discharge Exam:    Blood pressure 156/82, pulse 67, temperature 97.6 F (36.4 C), temperature source Oral, resp. rate 20, height 5' 6"  (1.676 m), weight 102.286 kg (225 lb 8 oz), SpO2 100 %.   General: Well developed, well nourished, in mild distress due to pain in R. Arm.  Head: Normocephalic, atraumatic, sclera  non-icteric, mucus membranes are moist Neck: Supple. JVD not elevated. Lungs: Bilateral breath sounds, slightly decreased in bases. CTA A/P Heart: RRR with S1 S2. No murmurs, rubs, or gallops appreciated. Abdomen: Soft, non-tender, non-distended with normoactive bowel sounds. No rebound/guarding. No obvious abdominal masses. M-S: Strength and tone appear normal for age. Lower extremities: No LE edema Neuro: Alert and oriented X 3. Moves all extremities spontaneously. Psych: Responds to questions appropriately with a normal affect. Dialysis Access: RUA AVF + thrill + bruit     Signed: Reyne Dumas 10/14/2015, 8:57 AM        Time spent >45 mins

## 2015-10-14 NOTE — Progress Notes (Signed)
Pt given discharge instructions, prescriptions, and care notes. Pt verbalized understanding AEB no further questions or concerns at this time. IV was discontinued, no redness, pain, or swelling noted at this time. Pt left the floor via wheelchair with staff in stable condition. 

## 2015-10-14 NOTE — Discharge Summary (Signed)
Physician Discharge Summary  Davidson Palmieri MRN: [id number] DOB/AGE: 29-Jan-1956 60 y.o.  PCP: Elyn Peers, MD   Admit date: 10/11/2015 Discharge date: 10/14/2015  Discharge Diagnoses:     Principal Problem:   HCAP (healthcare-associated pneumonia) Active Problems:   Essential hypertension   Acute gout   End-stage renal failure with renal transplant ([place])   Diabetes mellitus type 2, controlled ([place])   Anemia in chronic kidney disease   Kidney transplant failure   Acute respiratory disease   CAP (community acquired pneumonia)    Follow-up recommendations Follow-up with PCP in 3-5 days , including all  additional recommended appointments as below Follow-up CBC, CMP in 3-5 days Patient would benefit from outpatient hand surgery consultation for his degenerative arthropathy of the right hand     Medication List    TAKE these medications        amoxicillin-clavulanate 500-125 MG tablet  Commonly known as:  AUGMENTIN  Take 1 tablet (500 mg total) by mouth 3 (three) times daily.     aspirin 81 MG tablet  Take 81 mg by mouth daily.     calcitRIOL 0.5 MCG capsule  Commonly known as:  ROCALTROL  Take 3 capsules (1.5 mcg total) by mouth daily.     carvedilol 25 MG tablet  Commonly known as:  COREG  Take 25 mg by mouth 2 (two) times daily with a meal.     colchicine 0.6 MG tablet  Take 1 tablet (0.6 mg total) by mouth every 14 (fourteen) days.  Start taking on:  10/27/2015     HYDROcodone-acetaminophen 5-325 MG tablet  Commonly known as:  NORCO/VICODIN  Take 1-2 tablets by mouth every 6 (six) hours as needed for moderate pain.     predniSONE 50 MG tablet  Commonly known as:  DELTASONE  Take 1 tablet (50 mg total) by mouth daily with breakfast.     predniSONE 5 MG tablet  Commonly known as:  DELTASONE  Take 5 mg by mouth daily.     repaglinide 0.5 MG tablet  Commonly known as:  PRANDIN  Take 1 tablet (0.5 mg total) by mouth 2 (two) times daily before a meal.          Discharge Condition: Stable  Discharge Instructions Get Medicines reviewed and adjusted: Please take all your medications with you for your next visit with your Primary MD  Please request your Primary MD to go over all hospital tests and procedure/radiological results at the follow up, please ask your Primary MD to get all Hospital records sent to his/her office.  If you experience worsening of your admission symptoms, develop shortness of breath, life threatening emergency, suicidal or homicidal thoughts you must seek medical attention immediately by calling 911 or calling your MD immediately if symptoms less severe.  You must read complete instructions/literature along with all the possible adverse reactions/side effects for all the Medicines you take and that have been prescribed to you. Take any new Medicines after you have completely understood and accpet all the possible adverse reactions/side effects.   Do not drive when taking Pain medications.   Do not take more than prescribed Pain, Sleep and Anxiety Medications  Special Instructions: If you have smoked or chewed Tobacco in the last 2 yrs please stop smoking, stop any regular Alcohol and or any Recreational drug use.  Wear Seat belts while driving.  Please note  You were cared for by a hospitalist during your hospital stay. Once you are discharged, your primary care  physician will handle any further medical issues. Please note that NO REFILLS for any discharge medications will be authorized once you are discharged, as it is imperative that you return to your primary care physician (or establish a relationship with a primary care physician if you do not have one) for your aftercare needs so that they can reassess your need for medications and monitor your lab values.  Discharge Instructions    Diet - low sodium heart healthy    Complete by:  As directed      Diet - low sodium heart healthy    Complete by:  As directed       Increase activity slowly    Complete by:  As directed      Increase activity slowly    Complete by:  As directed            Allergies  Allergen Reactions  . Iodinated Diagnostic Agents Rash    Other Reaction: burning to mouth, swelling of lips.      Disposition: 01-Home or Self Care   Consults:  Nephrology     Significant Diagnostic Studies:  Dg Chest 2 View  10/03/2015  CLINICAL DATA:  Persistent dry cough.  Chest congestion, fever. EXAM: CHEST  2 VIEW COMPARISON:  09/14/2014 FINDINGS: Prior CABG. Cardiomegaly. No confluent opacities, effusions or edema. No acute bony abnormality. IMPRESSION: Cardiomegaly.  No active disease. Electronically Signed   By: Rolm Baptise M.D.   On: 10/03/2015 07:50   Mr Hand Right Wo Contrast  10/13/2015  CLINICAL DATA:  Abrupt onset right hand pain and swelling. History of go and renal failure. Pain especially in the wrist in the thenar area of the thumb. EXAM: MRI OF THE RIGHT HAND WITHOUT CONTRAST TECHNIQUE: Multiplanar, multisequence MR imaging was performed. No intravenous contrast was administered. COMPARISON:  None. FINDINGS: Despite efforts by the technologist and patient, motion artifact is present on today's exam and could not be eliminated. This reduces exam sensitivity and specificity. There is diffuse subcutaneous edema in the hand and wrist. Abnormal edema also tracks within the regional muscular structures diffusely although most notably in the abductor pollicis brevis, opponens pollicis, and flexor pollicis brevis. No disproportionate edema in the carpal tunnel. There is arthropathy of the first carpometacarpal articulation with associated spurring which may be partially fragmented along the base of the first metacarpal, a degenerative subcortical focus of edema laterally in the trapezium, and considerable spurring along the medial distal margin of the trapezium. There is a joint effusion as well as laxity and tear of the anterior  oblique ligament of the first [facility] joint shown on images 12-14 of series 4, with poor definition of the attachment of the anterior oblique ligament to the first metacarpal. This allows for radial subluxation of the first metacarpal. Mild degenerative findings at the articulation between the scaphoid and the trapezium. IMPRESSION: 1. Diffuse subcutaneous edema in the hands. Cellulitis is not excluded. Complex regional pain syndrome might also be considered along with potentially vascular causes. 2. There is degenerative arthropathy at the first carpometacarpal articulation along with a tear of the first metacarpal attachment of the anterior oblique ligament allowing for radial subluxation of the first metacarpal. A tear of the intermetacarpal ligament may also be implied by the radial subluxation. Electronically Signed   By: Van Clines M.D.   On: 10/13/2015 07:55   Dg Chest Port 1 View  10/11/2015  CLINICAL DATA:  Cough and end-stage renal disease EXAM: PORTABLE CHEST 1 VIEW COMPARISON:  10/03/2015 FINDINGS: Cardiac shadow is mildly enlarged but stable. Postsurgical changes are again seen. Increased density is noted in the medial right lung base which may represent some early infiltrate. No sizable effusion is seen. IMPRESSION: Likely early infiltrate in the right medial lung base. Electronically Signed   By: Inez Catalina M.D.   On: 10/11/2015 08:51        Filed Weights   10/13/15 0658 10/13/15 1126 10/14/15 0507  Weight: 102.7 kg (226 lb 6.6 oz) 100.2 kg (220 lb 14.4 oz) 102.286 kg (225 lb 8 oz)     Microbiology: Recent Results (from the past 240 hour(s))  Culture, blood (Routine X 2) w Reflex to ID Panel     Status: None (Preliminary result)   Collection Time: 10/11/15  8:19 AM  Result Value Ref Range Status   Specimen Description BLOOD LEFT ANTECUBITAL  Final   Special Requests BOTTLES DRAWN AEROBIC AND ANAEROBIC 5CC  Final   Culture NO GROWTH 2 DAYS  Final   Report Status PENDING   Incomplete  Culture, blood (Routine X 2) w Reflex to ID Panel     Status: None (Preliminary result)   Collection Time: 10/11/15 11:45 PM  Result Value Ref Range Status   Specimen Description BLOOD LEFT HAND  Final   Special Requests BOTTLES DRAWN AEROBIC AND ANAEROBIC 5ML  Final   Culture NO GROWTH 1 DAY  Final   Report Status PENDING  Incomplete  MRSA PCR Screening     Status: None   Collection Time: 10/12/15 11:20 PM  Result Value Ref Range Status   MRSA by PCR NEGATIVE NEGATIVE Final    Comment:        The GeneXpert MRSA Assay (FDA approved for NASAL specimens only), is one component of a comprehensive MRSA colonization surveillance program. It is not intended to diagnose MRSA infection nor to guide or monitor treatment for MRSA infections.        Blood Culture    Component Value Date/Time   SDES BLOOD LEFT HAND 10/11/2015 2345   SPECREQUEST BOTTLES DRAWN AEROBIC AND ANAEROBIC 5ML 10/11/2015 2345   CULT NO GROWTH 1 DAY 10/11/2015 2345   REPTSTATUS PENDING 10/11/2015 2345      Labs: Results for orders placed or performed during the hospital encounter of 10/11/15 (from the past 48 hour(s))  Glucose, capillary     Status: Abnormal   Collection Time: 10/12/15 11:57 AM  Result Value Ref Range   Glucose-Capillary 126 (H) 65 - 99 mg/dL  Vancomycin, trough     Status: Abnormal   Collection Time: 10/12/15 12:06 PM  Result Value Ref Range   Vancomycin Tr 27 (HH) 10.0 - 20.0 ug/mL    Comment: CRITICAL RESULT CALLED TO, READ BACK BY AND VERIFIED WITH: K.BRUMAGIN,RN 10/12/15 1246 BY BSLADE   Glucose, capillary     Status: Abnormal   Collection Time: 10/12/15  5:27 PM  Result Value Ref Range   Glucose-Capillary 168 (H) 65 - 99 mg/dL  Glucose, capillary     Status: Abnormal   Collection Time: 10/12/15 10:50 PM  Result Value Ref Range   Glucose-Capillary 169 (H) 65 - 99 mg/dL  MRSA PCR Screening     Status: None   Collection Time: 10/12/15 11:20 PM  Result Value Ref  Range   MRSA by PCR NEGATIVE NEGATIVE    Comment:        The GeneXpert MRSA Assay (FDA approved for NASAL specimens only), is one component of a comprehensive MRSA colonization  surveillance program. It is not intended to diagnose MRSA infection nor to guide or monitor treatment for MRSA infections.   CBC     Status: Abnormal   Collection Time: 10/13/15  7:37 AM  Result Value Ref Range   WBC 5.1 4.0 - 10.5 K/uL   RBC 3.10 (L) 4.22 - 5.81 MIL/uL   Hemoglobin 8.8 (L) 13.0 - 17.0 g/dL   HCT 29.0 (L) 39.0 - 52.0 %   MCV 93.5 78.0 - 100.0 fL   MCH 28.4 26.0 - 34.0 pg   MCHC 30.3 30.0 - 36.0 g/dL   RDW 17.3 (H) 11.5 - 15.5 %   Platelets 214 150 - 400 K/uL  Comprehensive metabolic panel     Status: Abnormal   Collection Time: 10/13/15  7:37 AM  Result Value Ref Range   Sodium 137 135 - 145 mmol/L   Potassium 3.7 3.5 - 5.1 mmol/L   Chloride 98 (L) 101 - 111 mmol/L   CO2 23 22 - 32 mmol/L   Glucose, Bld 219 (H) 65 - 99 mg/dL   BUN 50 (H) 6 - 20 mg/dL   Creatinine, Ser 12.09 (H) 0.61 - 1.24 mg/dL   Calcium 6.4 (LL) 8.9 - 10.3 mg/dL    Comment: CRITICAL RESULT CALLED TO, READ BACK BY AND VERIFIED WITHRudie Meyer RN AT 8016 10/13/15 BY WOOLLENK    Total Protein 7.5 6.5 - 8.1 g/dL   Albumin 2.5 (L) 3.5 - 5.0 g/dL   AST 22 15 - 41 U/L   ALT 12 (L) 17 - 63 U/L   Alkaline Phosphatase 87 38 - 126 U/L   Total Bilirubin 0.3 0.3 - 1.2 mg/dL   GFR calc non Af Amer 4 (L) >60 mL/min   GFR calc Af Amer 5 (L) >60 mL/min    Comment: (NOTE) The eGFR has been calculated using the CKD EPI equation. This calculation has not been validated in all clinical situations. eGFR's persistently <60 mL/min signify possible Chronic Kidney Disease.    Anion gap 16 (H) 5 - 15  Glucose, capillary     Status: None   Collection Time: 10/13/15 12:33 PM  Result Value Ref Range   Glucose-Capillary 80 65 - 99 mg/dL  Vancomycin, random     Status: None   Collection Time: 10/13/15  3:00 PM  Result Value  Ref Range   Vancomycin Rm 15 ug/mL    Comment:        Random Vancomycin therapeutic range is dependent on dosage and time of specimen collection. A peak range is 20.0-40.0 ug/mL A trough range is 5.0-15.0 ug/mL          Glucose, capillary     Status: Abnormal   Collection Time: 10/13/15  5:46 PM  Result Value Ref Range   Glucose-Capillary 161 (H) 65 - 99 mg/dL  Glucose, capillary     Status: Abnormal   Collection Time: 10/13/15  9:41 PM  Result Value Ref Range   Glucose-Capillary 182 (H) 65 - 99 mg/dL   Comment 1 Notify RN    Comment 2 Document in Chart   Glucose, capillary     Status: Abnormal   Collection Time: 10/14/15  8:24 AM  Result Value Ref Range   Glucose-Capillary 129 (H) 65 - 99 mg/dL     Lipid Panel  No results found for: CHOL, TRIG, HDL, CHOLHDL, VLDL, LDLCALC, LDLDIRECT   Lab Results  Component Value Date   HGBA1C 5.4 10/11/2015   HGBA1C 5.8 06/06/2015  HGBA1C 6.3 03/06/2015     Lab Results  Component Value Date   CREATININE 12.09* 10/13/2015     HPI :  60 y.o. male with ESRD on hemodialysis TTS at [facility]. PMH significant for renal transplant ([facility] 2008) with acute rejection episode 2015 . He restarted hemodialysis 09/15/2014, DMT1, hypertension, gout, secondary hyperparathyroidism, anemia of chronic disease. He presented to HD clinic for scheduled dialysis C/O pain in R forearm. Was sent to ED for evaluation. Was seen in ED upon arrival, RFA was exquisitely tender to touch, warm to touch. UE dopplers ordered were negative for DVT, RUA AVF appeared patent. Patient has history of gout and has recently been treated for URI symptoms-just finished Z-pack. He did C/O mild dyspnea on exertion. CXR revealed Likely early infiltrate in the right medial lung base. WBC 5.7 Hgb 11.3 Na 135 K+ 3.5 BUN 41 Scr 16.3. He is being admitted with gout of right  Wrist and possible HCAP.   HOSPITAL COURSE: * HCAP -Patient   completed outpatient  Zithromax with persistent respiratory symptoms and ongoing fatigue with some minor dyspnea on exertion Prior to admission;Found to have focal infiltrate which is new compared with chest x-ray one week prior and given patient on chronic steroids and dialysis will treat as healthcare acquired etiology Admitted to telemetry Screened for home oxygen requirements prior to discharge -Treated with Empiric antibiotics with vancomycin and Maxipime Blood cultures no growth so far, patient switched to Augmentin 5 more days    Acute gout/right hand and wrist swelling and pain Started on colchicine and prednisone, uric acid 7.8. 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  -Has been back on dialysis 1 year -Previous Prograf and CellCept tapered and discontinued over the past year remains on low-dose prednisone 5 mg daily, Resume after completing 50 mg a day 3 days of prednisone Patient followed by nephrology during this admission    Diabetes mellitus with renal manifestations, controlled Resume home medications -HgbA1c 5.4   Essential hypertension/Grade 1 Diastolic Dysfunction -Currently controlled -Continue carvedilol -Last echo was in 2015 and had no associated aortic stenosis or pulmonary hypertension although did have mild pulmonic valve regurgitation   Anemia in chronic kidney disease -Hemoglobin stable and at baseline of around 11 , Currently 8.5, no overt bleeding    Discharge Exam:    Blood pressure 156/82, pulse 67, temperature 97.6 F (36.4 C), temperature source Oral, resp. rate 20, height 5' 6"  (1.676 m), weight 102.286 kg (225 lb 8 oz), SpO2 100 %.   General: Well developed, well nourished, in mild distress due to  pain in R. Arm.  Head: Normocephalic, atraumatic, sclera non-icteric, mucus membranes are moist Neck: Supple. JVD not elevated. Lungs: Bilateral breath sounds, slightly decreased in bases. CTA A/P Heart: RRR with S1 S2. No murmurs, rubs, or gallops appreciated. Abdomen: Soft, non-tender, non-distended with normoactive bowel sounds. No rebound/guarding. No obvious abdominal masses. M-S: Strength and tone appear normal for age. Lower extremities: No LE edema Neuro: Alert and oriented X 3. Moves all extremities spontaneously. Psych: Responds to questions appropriately with a normal affect. Dialysis Access: RUA AVF + thrill + bruit     Signed: Reyne Dumas 10/14/2015, 9:07 AM        Time spent >45 mins

## 2015-10-15 DIAGNOSIS — N186 End stage renal disease: Secondary | ICD-10-CM | POA: Diagnosis not present

## 2015-10-15 DIAGNOSIS — E1029 Type 1 diabetes mellitus with other diabetic kidney complication: Secondary | ICD-10-CM | POA: Diagnosis not present

## 2015-10-15 DIAGNOSIS — J189 Pneumonia, unspecified organism: Secondary | ICD-10-CM | POA: Diagnosis not present

## 2015-10-15 DIAGNOSIS — D631 Anemia in chronic kidney disease: Secondary | ICD-10-CM | POA: Diagnosis not present

## 2015-10-15 DIAGNOSIS — Z992 Dependence on renal dialysis: Secondary | ICD-10-CM | POA: Diagnosis not present

## 2015-10-15 DIAGNOSIS — Z794 Long term (current) use of insulin: Secondary | ICD-10-CM | POA: Diagnosis not present

## 2015-10-15 MED ORDER — COLCHICINE 0.6 MG PO TABS: 0.6000 mg | ORAL_TABLET | Freq: Every day | ORAL | Status: DC

## 2015-10-16 DIAGNOSIS — Z794 Long term (current) use of insulin: Secondary | ICD-10-CM | POA: Diagnosis not present

## 2015-10-16 DIAGNOSIS — J189 Pneumonia, unspecified organism: Secondary | ICD-10-CM | POA: Diagnosis not present

## 2015-10-16 DIAGNOSIS — E1029 Type 1 diabetes mellitus with other diabetic kidney complication: Secondary | ICD-10-CM | POA: Diagnosis not present

## 2015-10-16 DIAGNOSIS — N2581 Secondary hyperparathyroidism of renal origin: Secondary | ICD-10-CM | POA: Diagnosis not present

## 2015-10-16 DIAGNOSIS — D509 Iron deficiency anemia, unspecified: Secondary | ICD-10-CM | POA: Diagnosis not present

## 2015-10-16 DIAGNOSIS — D631 Anemia in chronic kidney disease: Secondary | ICD-10-CM | POA: Diagnosis not present

## 2015-10-16 DIAGNOSIS — Z992 Dependence on renal dialysis: Secondary | ICD-10-CM | POA: Diagnosis not present

## 2015-10-16 DIAGNOSIS — N186 End stage renal disease: Secondary | ICD-10-CM | POA: Diagnosis not present

## 2015-10-16 LAB — CULTURE, BLOOD (ROUTINE X 2): Culture: NO GROWTH

## 2015-10-17 DIAGNOSIS — J189 Pneumonia, unspecified organism: Secondary | ICD-10-CM | POA: Diagnosis not present

## 2015-10-17 DIAGNOSIS — D631 Anemia in chronic kidney disease: Secondary | ICD-10-CM | POA: Diagnosis not present

## 2015-10-17 DIAGNOSIS — E1029 Type 1 diabetes mellitus with other diabetic kidney complication: Secondary | ICD-10-CM | POA: Diagnosis not present

## 2015-10-17 DIAGNOSIS — Z794 Long term (current) use of insulin: Secondary | ICD-10-CM | POA: Diagnosis not present

## 2015-10-17 DIAGNOSIS — N186 End stage renal disease: Secondary | ICD-10-CM | POA: Diagnosis not present

## 2015-10-17 DIAGNOSIS — Z992 Dependence on renal dialysis: Secondary | ICD-10-CM | POA: Diagnosis not present

## 2015-10-17 LAB — CULTURE, BLOOD (ROUTINE X 2): Culture: NO GROWTH

## 2015-10-18 DIAGNOSIS — N186 End stage renal disease: Secondary | ICD-10-CM | POA: Diagnosis not present

## 2015-10-18 DIAGNOSIS — E1029 Type 1 diabetes mellitus with other diabetic kidney complication: Secondary | ICD-10-CM | POA: Diagnosis not present

## 2015-10-18 DIAGNOSIS — D509 Iron deficiency anemia, unspecified: Secondary | ICD-10-CM | POA: Diagnosis not present

## 2015-10-18 DIAGNOSIS — D631 Anemia in chronic kidney disease: Secondary | ICD-10-CM | POA: Diagnosis not present

## 2015-10-18 DIAGNOSIS — N2581 Secondary hyperparathyroidism of renal origin: Secondary | ICD-10-CM | POA: Diagnosis not present

## 2015-10-20 DIAGNOSIS — D509 Iron deficiency anemia, unspecified: Secondary | ICD-10-CM | POA: Diagnosis not present

## 2015-10-20 DIAGNOSIS — D631 Anemia in chronic kidney disease: Secondary | ICD-10-CM | POA: Diagnosis not present

## 2015-10-20 DIAGNOSIS — N186 End stage renal disease: Secondary | ICD-10-CM | POA: Diagnosis not present

## 2015-10-20 DIAGNOSIS — E1029 Type 1 diabetes mellitus with other diabetic kidney complication: Secondary | ICD-10-CM | POA: Diagnosis not present

## 2015-10-20 DIAGNOSIS — N2581 Secondary hyperparathyroidism of renal origin: Secondary | ICD-10-CM | POA: Diagnosis not present

## 2015-10-23 DIAGNOSIS — N2581 Secondary hyperparathyroidism of renal origin: Secondary | ICD-10-CM | POA: Diagnosis not present

## 2015-10-23 DIAGNOSIS — N186 End stage renal disease: Secondary | ICD-10-CM | POA: Diagnosis not present

## 2015-10-23 DIAGNOSIS — E1029 Type 1 diabetes mellitus with other diabetic kidney complication: Secondary | ICD-10-CM | POA: Diagnosis not present

## 2015-10-23 DIAGNOSIS — D631 Anemia in chronic kidney disease: Secondary | ICD-10-CM | POA: Diagnosis not present

## 2015-10-23 DIAGNOSIS — D509 Iron deficiency anemia, unspecified: Secondary | ICD-10-CM | POA: Diagnosis not present

## 2015-10-25 ENCOUNTER — Encounter (HOSPITAL_COMMUNITY): Payer: Self-pay

## 2015-10-25 ENCOUNTER — Emergency Department (HOSPITAL_COMMUNITY)
Admission: EM | Admit: 2015-10-25 | Discharge: 2015-10-25 | Disposition: A | Discharge status: Home / Self Care | Payer: Medicare Other | Attending: Emergency Medicine | Admitting: Emergency Medicine

## 2015-10-25 DIAGNOSIS — E1029 Type 1 diabetes mellitus with other diabetic kidney complication: Secondary | ICD-10-CM | POA: Diagnosis not present

## 2015-10-25 DIAGNOSIS — M109 Gout, unspecified: Secondary | ICD-10-CM | POA: Diagnosis not present

## 2015-10-25 DIAGNOSIS — K439 Ventral hernia without obstruction or gangrene: Secondary | ICD-10-CM | POA: Diagnosis not present

## 2015-10-25 DIAGNOSIS — Z940 Kidney transplant status: Secondary | ICD-10-CM | POA: Insufficient documentation

## 2015-10-25 DIAGNOSIS — Z992 Dependence on renal dialysis: Secondary | ICD-10-CM | POA: Diagnosis not present

## 2015-10-25 DIAGNOSIS — Z7982 Long term (current) use of aspirin: Secondary | ICD-10-CM | POA: Diagnosis not present

## 2015-10-25 DIAGNOSIS — N2581 Secondary hyperparathyroidism of renal origin: Secondary | ICD-10-CM | POA: Diagnosis not present

## 2015-10-25 DIAGNOSIS — N186 End stage renal disease: Secondary | ICD-10-CM | POA: Diagnosis not present

## 2015-10-25 DIAGNOSIS — D631 Anemia in chronic kidney disease: Secondary | ICD-10-CM | POA: Diagnosis not present

## 2015-10-25 DIAGNOSIS — I120 Hypertensive chronic kidney disease with stage 5 chronic kidney disease or end stage renal disease: Secondary | ICD-10-CM | POA: Insufficient documentation

## 2015-10-25 DIAGNOSIS — R319 Hematuria, unspecified: Secondary | ICD-10-CM

## 2015-10-25 DIAGNOSIS — D509 Iron deficiency anemia, unspecified: Secondary | ICD-10-CM | POA: Diagnosis not present

## 2015-10-25 DIAGNOSIS — Z8774 Personal history of (corrected) congenital malformations of heart and circulatory system: Secondary | ICD-10-CM | POA: Insufficient documentation

## 2015-10-25 DIAGNOSIS — Z79899 Other long term (current) drug therapy: Secondary | ICD-10-CM | POA: Diagnosis not present

## 2015-10-25 LAB — COMPREHENSIVE METABOLIC PANEL
ALBUMIN: 2.7 g/dL — AB (ref 3.5–5.0)
ALK PHOS: 49 U/L (ref 38–126)
ALT: 13 U/L — AB (ref 17–63)
AST: 24 U/L (ref 15–41)
Anion gap: 9 (ref 5–15)
BUN: 14 mg/dL (ref 6–20)
CALCIUM: 7.9 mg/dL — AB (ref 8.9–10.3)
CHLORIDE: 99 mmol/L — AB (ref 101–111)
CO2: 27 mmol/L (ref 22–32)
CREATININE: 6.33 mg/dL — AB (ref 0.61–1.24)
GFR calc non Af Amer: 9 mL/min — ABNORMAL LOW (ref >60)
GFR, EST AFRICAN AMERICAN: 10 mL/min — AB (ref >60)
GLUCOSE: 81 mg/dL (ref 65–99)
Potassium: 3.9 mmol/L (ref 3.5–5.1)
SODIUM: 135 mmol/L (ref 135–145)
Total Bilirubin: 0.5 mg/dL (ref 0.3–1.2)
Total Protein: 7.5 g/dL (ref 6.5–8.1)

## 2015-10-25 LAB — CBC WITH DIFFERENTIAL/PLATELET
BASOS PCT: 1 %
Basophils Absolute: 0 10*3/uL (ref 0.0–0.1)
EOS ABS: 0.2 10*3/uL (ref 0.0–0.7)
Eosinophils Relative: 5 %
HCT: 25.9 % — ABNORMAL LOW (ref 39.0–52.0)
HEMOGLOBIN: 7.9 g/dL — AB (ref 13.0–17.0)
LYMPHS ABS: 1.4 10*3/uL (ref 0.7–4.0)
Lymphocytes Relative: 32 %
MCH: 28 pg (ref 26.0–34.0)
MCHC: 30.5 g/dL (ref 30.0–36.0)
MCV: 91.8 fL (ref 78.0–100.0)
MONO ABS: 0.5 10*3/uL (ref 0.1–1.0)
MONOS PCT: 11 %
NEUTROS PCT: 51 %
Neutro Abs: 2.3 10*3/uL (ref 1.7–7.7)
Platelets: 206 10*3/uL (ref 150–400)
RBC: 2.82 MIL/uL — ABNORMAL LOW (ref 4.22–5.81)
RDW: 17.9 % — AB (ref 11.5–15.5)
WBC: 4.5 10*3/uL (ref 4.0–10.5)

## 2015-10-25 LAB — I-STAT CG4 LACTIC ACID, ED: LACTIC ACID, VENOUS: 1.83 mmol/L (ref 0.5–2.0)

## 2015-10-25 NOTE — ED Notes (Signed)
Pt departed in NAD.  

## 2015-10-25 NOTE — ED Provider Notes (Signed)
CSN: [id number]     Arrival date & time 10/25/15  1640 History   First MD Initiated Contact with Patient 10/25/15 1821     Chief Complaint  Patient presents with  . Hematuria      HPI  60 y.o. male with end-stage renal disease on hemodialysis, hypertension, type 1 diabetes, who presents with hematuria. Patient reports that he still makes urine approximately once per week, and today noticed that his urine was bright red and appeared to have blood in it. Denies passage of clots. Denies dysuria. No abdominal or suprapubic pain. Denies flank pain. Patient has been going to his dialysis appointments as scheduled. Denies fevers, nausea, vomiting, chest pain, shortness of breath. No prior history of hematuria. He is not a smoker.   Past Medical History  Diagnosis Date  . Hypertension   . Diabetes mellitus without complication ([place])   . End-stage renal failure with renal transplant ([place])   . Gout   . Impotence of organic origin   . Type I (juvenile type) diabetes mellitus with renal manifestations, not stated as uncontrolled   . Secondary hyperparathyroidism [facility])    Past Surgical History  Procedure Laterality Date  . Birth defect heart surgery  1996  . Kidney transplant  2009  . Appendectomy    . Av fistula placement Right 09/18/2014    Procedure: INSERTION OF ARTERIOVENOUS (AV) GORE-TEX GRAFT ARM USING 4-7MM  X 45CM STRETCH GORE-TEX VASCULAR GRAFT;  Surgeon: Rosetta Posner, MD;  Location: [facility] OR;  Service: Vascular;  Laterality: Right;   Family History  Problem Relation Age of Onset  . Hyperlipidemia Mother   . Hypertension Mother    Social History  Substance Use Topics  . Smoking status: Former Research scientist (life sciences)  . Smokeless tobacco: None  . Alcohol Use: No    Review of Systems  Constitutional: Negative for fever, chills, activity change and appetite change.  HENT: Negative for congestion, facial swelling, rhinorrhea and sore throat.   Eyes: Negative for visual disturbance.  Respiratory:  Negative for cough, shortness of breath and wheezing.   Cardiovascular: Negative for chest pain, palpitations and leg swelling.  Gastrointestinal: Negative for nausea, vomiting, abdominal pain, diarrhea, constipation and blood in stool.  Genitourinary: Positive for hematuria. Negative for dysuria, urgency, frequency, flank pain, decreased urine volume, discharge, penile swelling, scrotal swelling, penile pain and testicular pain.  Musculoskeletal: Negative for myalgias, back pain, joint swelling, arthralgias, neck pain and neck stiffness.  Skin: Negative for rash.  Neurological: Negative for dizziness, weakness, light-headedness and headaches.  Psychiatric/Behavioral: Negative for behavioral problems, confusion and agitation.      Allergies  Iodinated diagnostic agents  Home Medications   Prior to Admission medications   Medication Sig Start Date End Date Taking? Authorizing Provider  aspirin 81 MG tablet Take 81 mg by mouth daily.   Yes Historical Provider, MD  calcitRIOL (ROCALTROL) 0.5 MCG capsule Take 3 capsules (1.5 mcg total) by mouth daily. Patient taking differently: Take 1 mcg by mouth daily.  11/30/13  Yes Modena Jansky, MD  carvedilol (COREG) 25 MG tablet Take 25 mg by mouth 2 (two) times daily with a meal.   Yes Historical Provider, MD  colchicine 0.6 MG tablet Take 1 tablet (0.6 mg total) by mouth daily. 10/27/15  Yes Reyne Dumas, MD  HYDROcodone-acetaminophen (NORCO/VICODIN) 5-325 MG tablet Take 1-2 tablets by mouth every 6 (six) hours as needed for moderate pain. 10/14/15  Yes Reyne Dumas, MD  repaglinide (PRANDIN) 0.5 MG tablet Take 1  tablet (0.5 mg total) by mouth 2 (two) times daily before a meal. 06/06/15  Yes Renato Shin, MD   BP 164/84 mmHg  Pulse 89  Temp(Src) 99.9 F (37.7 C) (Oral)  Resp 16  Wt 101.5 kg  SpO2 97% Physical Exam  Constitutional: He is oriented to person, place, and time. He appears well-developed and well-nourished. No distress.  HENT:   Head: Normocephalic and atraumatic.  Right Ear: External ear normal.  Left Ear: External ear normal.  Nose: Nose normal.  Mouth/Throat: Oropharynx is clear and moist. No oropharyngeal exudate.  Eyes: Conjunctivae are normal. Pupils are equal, round, and reactive to light. Right eye exhibits no discharge. Left eye exhibits no discharge. No scleral icterus.  Neck: Normal range of motion. Neck supple. No tracheal deviation present.  Cardiovascular: Normal rate, regular rhythm and normal heart sounds.  Exam reveals no gallop and no friction rub.   No murmur heard. Pulmonary/Chest: Effort normal and breath sounds normal. No respiratory distress. He has no wheezes. He has no rales.  Abdominal: Soft. Bowel sounds are normal. He exhibits no distension. There is no tenderness. There is no rebound and no guarding. Hernia confirmed negative in the right inguinal area and confirmed negative in the left inguinal area.  Large ventral hernia, easily reducible.   Genitourinary: Testes normal and penis normal. Right testis shows no swelling and no tenderness. Left testis shows no swelling and no tenderness. No penile tenderness.  Musculoskeletal: Normal range of motion. He exhibits no edema or tenderness.  Neurological: He is alert and oriented to person, place, and time. No cranial nerve deficit. Coordination normal.  Skin: Skin is warm and dry. No rash noted. He is not diaphoretic.  Psychiatric: He has a normal mood and affect. His behavior is normal. Judgment and thought content normal.    ED Course  Procedures (including critical care time) Labs Review Labs Reviewed  COMPREHENSIVE METABOLIC PANEL - Abnormal; Notable for the following:    Chloride 99 (*)    Creatinine, Ser 6.33 (*)    Calcium 7.9 (*)    Albumin 2.7 (*)    ALT 13 (*)    GFR calc non Af Amer 9 (*)    GFR calc Af Amer 10 (*)    All other components within normal limits  CBC WITH DIFFERENTIAL/PLATELET - Abnormal; Notable for the  following:    RBC 2.82 (*)    Hemoglobin 7.9 (*)    HCT 25.9 (*)    RDW 17.9 (*)    All other components within normal limits  I-STAT CG4 LACTIC ACID, ED    Imaging Review No results found. I have personally reviewed and evaluated these images and lab results as part of my medical decision-making.   EKG Interpretation None      MDM   Final diagnoses:  Hematuria   Patient is generally well-appearing. Hemoglobin of 7.9 which appears to be just mildly below his baseline. He denies lightheadedness, shortness of breath, fatigue, other symptoms of anemia. Remainder of labs are at his baseline. Unable to perform UA as he is unlikely to produce more urine for about another week secondary to his kidney failure. Recommend follow-up with his primary care doctor in 3 days for repeat hemoglobin and return to the emergency department for any abdominal pain. No further workup required at this time as the pt is hemodynamically stable and asymptomatic. Recommend follow-up with urology for urethroscopy/cystoscopy should symptoms persist. He was discharged home in good condition.  Shanyn Preisler Algernon Huxley, MD 10/26/15 [id number]  Pattricia Boss, MD 10/27/15 [phone]

## 2015-10-25 NOTE — ED Notes (Signed)
Patient reports that he had normal dialysis treatment today and after returning home had hematuria. Reports that he normally doesn't pass much urine but today had good strem with the blood. Denies pain

## 2015-10-25 NOTE — Discharge Instructions (Signed)

## 2015-10-25 NOTE — ED Notes (Signed)
Pt refusing to remove pants when changing into gown. Pt made aware of EDP request to change and still refuses.

## 2015-10-26 ENCOUNTER — Other Ambulatory Visit: Payer: Self-pay | Admitting: Family Medicine

## 2015-10-26 ENCOUNTER — Other Ambulatory Visit (HOSPITAL_COMMUNITY)
Admission: RE | Admit: 2015-10-26 | Discharge: 2015-10-26 | Disposition: A | Discharge status: Home / Self Care | Payer: Medicare Other | Source: Ambulatory Visit | Attending: Family Medicine | Admitting: Family Medicine

## 2015-10-26 DIAGNOSIS — R311 Benign essential microscopic hematuria: Secondary | ICD-10-CM | POA: Diagnosis not present

## 2015-10-26 DIAGNOSIS — N186 End stage renal disease: Secondary | ICD-10-CM | POA: Diagnosis not present

## 2015-10-26 DIAGNOSIS — Z951 Presence of aortocoronary bypass graft: Secondary | ICD-10-CM | POA: Diagnosis not present

## 2015-10-26 DIAGNOSIS — Z113 Encounter for screening for infections with a predominantly sexual mode of transmission: Secondary | ICD-10-CM | POA: Insufficient documentation

## 2015-10-26 DIAGNOSIS — Z992 Dependence on renal dialysis: Secondary | ICD-10-CM | POA: Diagnosis not present

## 2015-10-26 DIAGNOSIS — E089 Diabetes mellitus due to underlying condition without complications: Secondary | ICD-10-CM | POA: Diagnosis not present

## 2015-10-26 DIAGNOSIS — T8610 Unspecified complication of kidney transplant: Secondary | ICD-10-CM | POA: Diagnosis not present

## 2015-10-26 DIAGNOSIS — Z940 Kidney transplant status: Secondary | ICD-10-CM | POA: Diagnosis not present

## 2015-10-27 DIAGNOSIS — D631 Anemia in chronic kidney disease: Secondary | ICD-10-CM | POA: Diagnosis not present

## 2015-10-27 DIAGNOSIS — R6883 Chills (without fever): Secondary | ICD-10-CM | POA: Diagnosis not present

## 2015-10-27 DIAGNOSIS — D509 Iron deficiency anemia, unspecified: Secondary | ICD-10-CM | POA: Diagnosis not present

## 2015-10-27 DIAGNOSIS — N186 End stage renal disease: Secondary | ICD-10-CM | POA: Diagnosis not present

## 2015-10-30 ENCOUNTER — Emergency Department (HOSPITAL_COMMUNITY): Payer: Medicare Other

## 2015-10-30 ENCOUNTER — Encounter (HOSPITAL_COMMUNITY): Payer: Self-pay | Admitting: Emergency Medicine

## 2015-10-30 ENCOUNTER — Non-Acute Institutional Stay (HOSPITAL_COMMUNITY)
Admission: EM | Admit: 2015-10-30 | Discharge: 2015-10-30 | Disposition: A | Discharge status: Home / Self Care | Payer: Medicare Other | Attending: Emergency Medicine | Admitting: Emergency Medicine

## 2015-10-30 DIAGNOSIS — R319 Hematuria, unspecified: Secondary | ICD-10-CM | POA: Diagnosis not present

## 2015-10-30 DIAGNOSIS — R6883 Chills (without fever): Secondary | ICD-10-CM | POA: Diagnosis not present

## 2015-10-30 DIAGNOSIS — Z7982 Long term (current) use of aspirin: Secondary | ICD-10-CM | POA: Diagnosis not present

## 2015-10-30 DIAGNOSIS — E872 Acidosis: Secondary | ICD-10-CM | POA: Diagnosis not present

## 2015-10-30 DIAGNOSIS — E8770 Fluid overload, unspecified: Secondary | ICD-10-CM | POA: Diagnosis not present

## 2015-10-30 DIAGNOSIS — E1122 Type 2 diabetes mellitus with diabetic chronic kidney disease: Secondary | ICD-10-CM | POA: Diagnosis not present

## 2015-10-30 DIAGNOSIS — R0602 Shortness of breath: Secondary | ICD-10-CM | POA: Diagnosis not present

## 2015-10-30 DIAGNOSIS — J90 Pleural effusion, not elsewhere classified: Secondary | ICD-10-CM | POA: Diagnosis not present

## 2015-10-30 DIAGNOSIS — N186 End stage renal disease: Secondary | ICD-10-CM

## 2015-10-30 DIAGNOSIS — Z87891 Personal history of nicotine dependence: Secondary | ICD-10-CM | POA: Insufficient documentation

## 2015-10-30 DIAGNOSIS — Z940 Kidney transplant status: Secondary | ICD-10-CM | POA: Diagnosis not present

## 2015-10-30 DIAGNOSIS — Z79899 Other long term (current) drug therapy: Secondary | ICD-10-CM | POA: Insufficient documentation

## 2015-10-30 DIAGNOSIS — I120 Hypertensive chronic kidney disease with stage 5 chronic kidney disease or end stage renal disease: Secondary | ICD-10-CM | POA: Diagnosis not present

## 2015-10-30 DIAGNOSIS — D631 Anemia in chronic kidney disease: Secondary | ICD-10-CM | POA: Diagnosis not present

## 2015-10-30 DIAGNOSIS — R0600 Dyspnea, unspecified: Secondary | ICD-10-CM | POA: Diagnosis present

## 2015-10-30 DIAGNOSIS — Z992 Dependence on renal dialysis: Secondary | ICD-10-CM | POA: Insufficient documentation

## 2015-10-30 DIAGNOSIS — D509 Iron deficiency anemia, unspecified: Secondary | ICD-10-CM | POA: Diagnosis not present

## 2015-10-30 LAB — CBC WITH DIFFERENTIAL/PLATELET
Basophils Absolute: 0.1 10*3/uL (ref 0.0–0.1)
Basophils Relative: 1 %
EOS ABS: 0.5 10*3/uL (ref 0.0–0.7)
EOS PCT: 12 %
HCT: 26.9 % — ABNORMAL LOW (ref 39.0–52.0)
Hemoglobin: 8.3 g/dL — ABNORMAL LOW (ref 13.0–17.0)
LYMPHS ABS: 1.4 10*3/uL (ref 0.7–4.0)
LYMPHS PCT: 35 %
MCH: 27.9 pg (ref 26.0–34.0)
MCHC: 30.9 g/dL (ref 30.0–36.0)
MCV: 90.6 fL (ref 78.0–100.0)
MONO ABS: 0.2 10*3/uL (ref 0.1–1.0)
MONOS PCT: 5 %
Neutro Abs: 2 10*3/uL (ref 1.7–7.7)
Neutrophils Relative %: 47 %
PLATELETS: 234 10*3/uL (ref 150–400)
RBC: 2.97 MIL/uL — AB (ref 4.22–5.81)
RDW: 17.8 % — AB (ref 11.5–15.5)
WBC: 4.1 10*3/uL (ref 4.0–10.5)

## 2015-10-30 LAB — URINALYSIS, ROUTINE W REFLEX MICROSCOPIC
GLUCOSE, UA: 100 mg/dL — AB
Ketones, ur: 15 mg/dL — AB
LEUKOCYTES UA: NEGATIVE
Nitrite: NEGATIVE
Specific Gravity, Urine: 1.035 — ABNORMAL HIGH (ref 1.005–1.030)
pH: 7 (ref 5.0–8.0)

## 2015-10-30 LAB — I-STAT TROPONIN, ED: Troponin i, poc: 0.23 ng/mL (ref 0.00–0.08)

## 2015-10-30 LAB — BASIC METABOLIC PANEL
ANION GAP: 16 — AB (ref 5–15)
BUN: 43 mg/dL — ABNORMAL HIGH (ref 6–20)
CO2: 21 mmol/L — ABNORMAL LOW (ref 22–32)
Calcium: 7.2 mg/dL — ABNORMAL LOW (ref 8.9–10.3)
Chloride: 98 mmol/L — ABNORMAL LOW (ref 101–111)
Creatinine, Ser: 14.86 mg/dL — ABNORMAL HIGH (ref 0.61–1.24)
GFR calc Af Amer: 4 mL/min — ABNORMAL LOW (ref >60)
GFR, EST NON AFRICAN AMERICAN: 3 mL/min — AB (ref >60)
GLUCOSE: 76 mg/dL (ref 65–99)
POTASSIUM: 4.2 mmol/L (ref 3.5–5.1)
Sodium: 135 mmol/L (ref 135–145)

## 2015-10-30 LAB — URINE MICROSCOPIC-ADD ON

## 2015-10-30 LAB — BRAIN NATRIURETIC PEPTIDE: B NATRIURETIC PEPTIDE 5: 2633.8 pg/mL — AB (ref 0.0–100.0)

## 2015-10-30 MED ORDER — HEPARIN SODIUM (PORCINE) 1000 UNIT/ML DIALYSIS: 1000.0000 [IU] | INTRAMUSCULAR | Status: DC | PRN

## 2015-10-30 MED ORDER — PENTAFLUOROPROP-TETRAFLUOROETH EX AERO: 1.0000 "application " | INHALATION_SPRAY | CUTANEOUS | Status: DC | PRN

## 2015-10-30 MED ORDER — ALBUTEROL SULFATE (2.5 MG/3ML) 0.083% IN NEBU
5.0000 mg | INHALATION_SOLUTION | Freq: Once | RESPIRATORY_TRACT | Status: AC
Start: 2015-10-30 — End: 2015-10-30
  Administered 2015-10-30: 5 mg via RESPIRATORY_TRACT
  Filled 2015-10-30: qty 6

## 2015-10-30 MED ORDER — ALTEPLASE 2 MG IJ SOLR: 2.0000 mg | Freq: Once | INTRAMUSCULAR | Status: DC | PRN

## 2015-10-30 MED ORDER — SODIUM CHLORIDE 0.9 % IV SOLN: 100.0000 mL | INTRAVENOUS | Status: DC | PRN

## 2015-10-30 MED ORDER — IPRATROPIUM BROMIDE 0.02 % IN SOLN
0.5000 mg | Freq: Once | RESPIRATORY_TRACT | Status: AC
  Administered 2015-10-30: 0.5 mg via RESPIRATORY_TRACT
  Filled 2015-10-30: qty 2.5

## 2015-10-30 MED ORDER — LIDOCAINE-PRILOCAINE 2.5-2.5 % EX CREA: 1.0000 "application " | TOPICAL_CREAM | CUTANEOUS | Status: DC | PRN

## 2015-10-30 MED ORDER — LIDOCAINE HCL (PF) 1 % IJ SOLN: 5.0000 mL | INTRAMUSCULAR | Status: DC | PRN

## 2015-10-30 NOTE — ED Notes (Addendum)
Pt here for increased SOB and some hematuria; pt dialysis pt and sent here for further eval; pt sts cough x 3 weeks

## 2015-10-30 NOTE — Procedures (Signed)
Patient was at HD today and had difficulties using the RUA AVG, low venous pressures in one of the needles.  An appt was made at [facility] for tomorrow, but patient was SOB so he came to ED. In ED RA SaO2 was normal but pt appeared dyspneic and we were asked to see for dialysis in hospital.    Patient denies severe SOB, +some cough left over from recent PNA episode.  Is 1.9kg over dry wt, but left HD under dry wt last HD.  Usual HD is TTS at [name].  On exam crackles L base, R clear , occ wheezes, no jvd or LE edema. Had neb Rx in ED and SOB improved. CXR shows new small bilat effusions, no CHF/ edema.  Plan extended HD with goal UF 3-4 kg and dc home after HD.  Appt with [facility] for tomorrow is at 12:30, pt is aware. For dc after HD if remains stable.   I was present at this dialysis session, have reviewed the session itself and made  appropriate changes Kelly Splinter MD [place] pager [phone]    cell [phone] 10/30/2015, 1:26 PM

## 2015-10-30 NOTE — Progress Notes (Signed)
tx completed without any complications; pt denies any pain, n/v, dizziness, SOB or weakness. Pt was accompanied to his car at the ED entrance in a wheelchair.

## 2015-10-30 NOTE — ED Notes (Signed)
Notified Dr Maryan Rued troponin 0.23

## 2015-10-30 NOTE — ED Provider Notes (Signed)
CSN: [id number]     Arrival date & time 10/30/15  0841 History   First MD Initiated Contact with Patient 10/30/15 [phone]     Chief Complaint  Patient presents with  . 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 Patient states they access the graft but were not able to obtain the pressure that was required. He has noted over the last 2 days worsening shortness of breath to where now he cannot walk across a room without becoming winded. He has had a persistent cough since his hospitalization with the pneumonia which improves with albuterol. He only coughs up clear or white phlegm which is unchanged for the last month. He denies any fevers.  Patient states he was sent here from his dialysis unit via Dr. Jeanella Anton. Secondly patient states for the last week he has had bright red hematuria that occurs every time he urinates. However he does not urinate frequently but when he has had urine and has been bloody. He denies any suprapubic or flank tenderness. He denies any instrumentation during or after his hospitalization. He has not seen a urologist.  Patient is a 60 y.o. male presenting with shortness of breath. The history is provided by the patient.  Shortness of Breath   Past Medical History  Diagnosis Date  . Hypertension   . Diabetes mellitus without complication ([place])   . End-stage renal failure with renal transplant ([place])   . Gout   . Impotence of organic origin   . Type I (juvenile type) diabetes mellitus with renal manifestations, not stated as uncontrolled   . Secondary  hyperparathyroidism [facility])    Past Surgical History  Procedure Laterality Date  . Birth defect heart surgery  1996  . Kidney transplant  2009  . Appendectomy    . Av fistula placement Right 09/18/2014    Procedure: INSERTION OF ARTERIOVENOUS (AV) GORE-TEX GRAFT ARM USING 4-7MM  X 45CM STRETCH GORE-TEX VASCULAR GRAFT;  Surgeon: Rosetta Posner, MD;  Location: [facility] OR;  Service: Vascular;  Laterality: Right;   Family History  Problem Relation Age of Onset  . Hyperlipidemia Mother   . Hypertension Mother    Social History  Substance Use Topics  . Smoking status: Former Research scientist (life sciences)  . Smokeless tobacco: None  . Alcohol Use: No    Review of Systems  Respiratory: Positive for shortness of breath.   Genitourinary: Positive for hematuria.  All other systems reviewed and are negative.     Allergies  Iodinated diagnostic agents  Home Medications   Prior to Admission medications   Medication Sig Start Date End Date Taking? Authorizing Provider  aspirin 81 MG tablet Take 81 mg by mouth daily.    Historical Provider, MD  calcitRIOL (ROCALTROL) 0.5 MCG capsule Take 3 capsules (1.5 mcg total) by mouth daily. Patient taking differently: Take 1 mcg by mouth daily.  11/30/13   Modena Jansky, MD  carvedilol (COREG) 25 MG tablet Take 25 mg by mouth 2 (two) times daily with a meal.    Historical Provider, MD  colchicine 0.6 MG tablet Take 1 tablet (0.6 mg total) by mouth daily. 10/27/15   Reyne Dumas, MD  HYDROcodone-acetaminophen (NORCO/VICODIN) [phone]  MG tablet Take 1-2 tablets by mouth every 6 (six) hours as needed for moderate pain. 10/14/15   Reyne Dumas, MD  repaglinide (PRANDIN) 0.5 MG tablet Take 1 tablet (0.5 mg total) by mouth 2 (two) times daily before a meal. 06/06/15   Renato Shin, MD   BP 144/78 mmHg  Pulse 78  Temp(Src) 98.7 F (37.1 C) (Oral)  Resp 22  SpO2 97% Physical Exam  Constitutional: He is oriented to person, place, and time. He appears well-developed and well-nourished. No  distress.  HENT:  Head: Normocephalic and atraumatic.  Mouth/Throat: Oropharynx is clear and moist.  Eyes: Conjunctivae and EOM are normal. Pupils are equal, round, and reactive to light.  Neck: Normal range of motion. Neck supple.  Cardiovascular: Normal rate, regular rhythm and intact distal pulses.   No murmur heard. Pulmonary/Chest: Effort normal. No respiratory distress. He has wheezes. He has rales.  occassional wheezing in the upper lobes and rales in the bases bilaterally  Abdominal: Soft. He exhibits no distension. There is no tenderness. There is no rebound and no guarding.  Large ventral hernia which is reproducible  Musculoskeletal: Normal range of motion. He exhibits edema. He exhibits no tenderness.  Graft in the RUE with palpable thrill.  Trace edema in the bilateral lower ext  Neurological: He is alert and oriented to person, place, and time.  Skin: Skin is warm and dry. No rash noted. No erythema.  Psychiatric: He has a normal mood and affect. His behavior is normal.  Nursing note and vitals reviewed.   ED Course  Procedures (including critical care time) Labs Review Labs Reviewed  CBC WITH DIFFERENTIAL/PLATELET - Abnormal; Notable for the following:    RBC 2.97 (*)    Hemoglobin 8.3 (*)    HCT 26.9 (*)    RDW 17.8 (*)    All other components within normal limits  BASIC METABOLIC PANEL - Abnormal; Notable for the following:    Chloride 98 (*)    CO2 21 (*)    BUN 43 (*)    Creatinine, Ser 14.86 (*)    Calcium 7.2 (*)    GFR calc non Af Amer 3 (*)    GFR calc Af Amer 4 (*)    Anion gap 16 (*)    All other components within normal limits  BRAIN NATRIURETIC PEPTIDE - Abnormal; Notable for the following:    B Natriuretic Peptide 2633.8 (*)    All other components within normal limits  I-STAT TROPOININ, ED - Abnormal; Notable for the following:    Troponin i, poc 0.23 (*)    All other components within normal limits  URINALYSIS, ROUTINE W REFLEX MICROSCOPIC  (NOT AT [facility])    Imaging Review Dg Chest 2 View  10/30/2015  CLINICAL DATA:  60 year old male with chest pain shortness of breath and gross hematuria for 1 week. Initial encounter. EXAM: CHEST  2 VIEW COMPARISON:  Chest radiographs 10/11/2015 hit and earlier. FINDINGS: Increased basilar opacity which on the lateral view appears to be pooling in the costophrenic angles compatible with small effusions. Stable cardiomegaly and mediastinal contours. No pneumothorax. No acute pulmonary edema. No other confluent pulmonary opacity. Stable peritracheal and mediastinal surgical clips and/or vascular stent material. No acute osseous abnormality identified. IMPRESSION: New small bilateral pleural effusions. Difficult to exclude associated mild or developing lower lobe infection. No other acute cardiopulmonary abnormality. Electronically Signed   By: Genevie Ann M.D.   On: 10/30/2015 09:20   I have personally reviewed and evaluated  these images and lab results as part of my medical decision-making.   EKG Interpretation   Date/Time:  Tuesday October 30 2015 10:08:35 EDT Ventricular Rate:  75 PR Interval:  119 QRS Duration: 101 QT Interval:  461 QTC Calculation: 515 R Axis:   103 Text Interpretation:  Sinus rhythm Borderline short PR interval Right axis  deviation Low voltage, precordial leads Abnormal T, consider ischemia,  lateral leads Prolonged QT interval Baseline wander in lead(s) V3  unchanged from EKG on 09/28/2013 Confirmed by Maryan Rued  MD, Loree Fee ([id number])  on 10/30/2015 10:14:07 AM      MDM   Final diagnoses:  Hypervolemia, unspecified hypervolemia type  ESRD (end stage renal disease) ([place])  Hematuria  Patient is a 60 year old male with multiple medical problems presenting today with shortness of breath and difficulty accessing graft to get dialysis. His last dialysis was Saturday and he does have signs of fluid overload today. He is tachypnea and appear short of breath on exam but can speak in  full signs as. He has basal rales with an occasional upper field wheeze. He states the wheezing and cough does improve with his inhaler which she has been using since being discharged from the hospital with healthcare associated pneumonia. He completed his course of antibiotics and is having no fevers, chills or generalized body aches at this time. For the last 1 week he has noted bright red hematuria when he does urinate. His urination is infrequent but it is bloody when he goes. He has never had this issue before. He denies any suprapubic or flank tenderness concerning for a kidney stone or infection.  He has no abdominal pain on exam or concern for incarcerated hernia. His graft does have a thrill however will discuss with nephrology the issues that occurred at dialysis. Based on patient's exam it appears that he will need dialysis today due to fluid overload.  he denies any chest pain.  Chest x-ray today shows bilateral pleural effusions which are new with a stable hemoglobin in the 8 range and BNP with mild metabolic acidosis with elevated BUN and creatinine consistent with needing dialysis.  UA is pending however unclear if patient will be able to produce urine.  EKG unchanged from a prior.  Trop is mildly elevated today at .23 which is most likely related to strain and fluid overload.  Discussed with Dr. Jonnie Finner and pt will be dailyzed.  Discussed with him f/u with urology for blood in the urine.    Blanchie Dessert, MD 10/30/15 1244

## 2015-10-31 DIAGNOSIS — I871 Compression of vein: Secondary | ICD-10-CM | POA: Diagnosis not present

## 2015-10-31 DIAGNOSIS — N186 End stage renal disease: Secondary | ICD-10-CM | POA: Diagnosis not present

## 2015-10-31 DIAGNOSIS — T82858D Stenosis of vascular prosthetic devices, implants and grafts, subsequent encounter: Secondary | ICD-10-CM | POA: Diagnosis not present

## 2015-10-31 DIAGNOSIS — Z992 Dependence on renal dialysis: Secondary | ICD-10-CM | POA: Diagnosis not present

## 2015-10-31 LAB — URINE CYTOLOGY ANCILLARY ONLY
Bacterial vaginitis: POSITIVE — AB
CHLAMYDIA, DNA PROBE: NEGATIVE
Candida vaginitis: NEGATIVE
Neisseria Gonorrhea: NEGATIVE
Trichomonas: NEGATIVE

## 2015-11-01 DIAGNOSIS — R6883 Chills (without fever): Secondary | ICD-10-CM | POA: Diagnosis not present

## 2015-11-01 DIAGNOSIS — D509 Iron deficiency anemia, unspecified: Secondary | ICD-10-CM | POA: Diagnosis not present

## 2015-11-01 DIAGNOSIS — D631 Anemia in chronic kidney disease: Secondary | ICD-10-CM | POA: Diagnosis not present

## 2015-11-01 DIAGNOSIS — N186 End stage renal disease: Secondary | ICD-10-CM | POA: Diagnosis not present

## 2015-11-03 DIAGNOSIS — R6883 Chills (without fever): Secondary | ICD-10-CM | POA: Diagnosis not present

## 2015-11-03 DIAGNOSIS — N186 End stage renal disease: Secondary | ICD-10-CM | POA: Diagnosis not present

## 2015-11-03 DIAGNOSIS — D509 Iron deficiency anemia, unspecified: Secondary | ICD-10-CM | POA: Diagnosis not present

## 2015-11-03 DIAGNOSIS — D631 Anemia in chronic kidney disease: Secondary | ICD-10-CM | POA: Diagnosis not present

## 2015-11-06 ENCOUNTER — Other Ambulatory Visit (HOSPITAL_COMMUNITY): Payer: Self-pay | Admitting: *Deleted

## 2015-11-06 ENCOUNTER — Encounter (HOSPITAL_COMMUNITY)
Admission: RE | Admit: 2015-11-06 | Discharge: 2015-11-06 | Disposition: A | Discharge status: Home / Self Care | Payer: Medicare Other | Source: Ambulatory Visit | Attending: Nephrology | Admitting: Nephrology

## 2015-11-06 DIAGNOSIS — N186 End stage renal disease: Secondary | ICD-10-CM | POA: Diagnosis not present

## 2015-11-06 DIAGNOSIS — D649 Anemia, unspecified: Secondary | ICD-10-CM | POA: Diagnosis not present

## 2015-11-06 DIAGNOSIS — D631 Anemia in chronic kidney disease: Secondary | ICD-10-CM | POA: Diagnosis not present

## 2015-11-06 DIAGNOSIS — D509 Iron deficiency anemia, unspecified: Secondary | ICD-10-CM | POA: Diagnosis not present

## 2015-11-06 DIAGNOSIS — R6883 Chills (without fever): Secondary | ICD-10-CM | POA: Diagnosis not present

## 2015-11-06 LAB — PREPARE RBC (CROSSMATCH)

## 2015-11-07 ENCOUNTER — Encounter (HOSPITAL_COMMUNITY)
Admission: RE | Admit: 2015-11-07 | Discharge: 2015-11-07 | Disposition: A | Discharge status: Home / Self Care | Payer: Medicare Other | Source: Ambulatory Visit | Attending: Nephrology | Admitting: Nephrology

## 2015-11-07 DIAGNOSIS — D649 Anemia, unspecified: Secondary | ICD-10-CM | POA: Diagnosis not present

## 2015-11-07 MED ORDER — SODIUM CHLORIDE 0.9 % IV SOLN: Freq: Once | INTRAVENOUS | Status: DC

## 2015-11-08 DIAGNOSIS — N186 End stage renal disease: Secondary | ICD-10-CM | POA: Diagnosis not present

## 2015-11-08 DIAGNOSIS — R6883 Chills (without fever): Secondary | ICD-10-CM | POA: Diagnosis not present

## 2015-11-08 DIAGNOSIS — D509 Iron deficiency anemia, unspecified: Secondary | ICD-10-CM | POA: Diagnosis not present

## 2015-11-08 DIAGNOSIS — D631 Anemia in chronic kidney disease: Secondary | ICD-10-CM | POA: Diagnosis not present

## 2015-11-08 LAB — TYPE AND SCREEN
ABO/RH(D): O POS
Antibody Screen: NEGATIVE
Unit division: 0

## 2015-11-10 DIAGNOSIS — D509 Iron deficiency anemia, unspecified: Secondary | ICD-10-CM | POA: Diagnosis not present

## 2015-11-10 DIAGNOSIS — N186 End stage renal disease: Secondary | ICD-10-CM | POA: Diagnosis not present

## 2015-11-10 DIAGNOSIS — R6883 Chills (without fever): Secondary | ICD-10-CM | POA: Diagnosis not present

## 2015-11-10 DIAGNOSIS — D631 Anemia in chronic kidney disease: Secondary | ICD-10-CM | POA: Diagnosis not present

## 2015-11-13 DIAGNOSIS — R6883 Chills (without fever): Secondary | ICD-10-CM | POA: Diagnosis not present

## 2015-11-13 DIAGNOSIS — D509 Iron deficiency anemia, unspecified: Secondary | ICD-10-CM | POA: Diagnosis not present

## 2015-11-13 DIAGNOSIS — D631 Anemia in chronic kidney disease: Secondary | ICD-10-CM | POA: Diagnosis not present

## 2015-11-13 DIAGNOSIS — N186 End stage renal disease: Secondary | ICD-10-CM | POA: Diagnosis not present

## 2015-11-15 DIAGNOSIS — D509 Iron deficiency anemia, unspecified: Secondary | ICD-10-CM | POA: Diagnosis not present

## 2015-11-15 DIAGNOSIS — R6883 Chills (without fever): Secondary | ICD-10-CM | POA: Diagnosis not present

## 2015-11-15 DIAGNOSIS — D631 Anemia in chronic kidney disease: Secondary | ICD-10-CM | POA: Diagnosis not present

## 2015-11-15 DIAGNOSIS — N186 End stage renal disease: Secondary | ICD-10-CM | POA: Diagnosis not present

## 2015-11-17 ENCOUNTER — Inpatient Hospital Stay (HOSPITAL_COMMUNITY)
Admission: EM | Admit: 2015-11-17 | Discharge: 2015-11-23 | DRG: 698 | Disposition: A | Discharge status: Other Acute Inpatient Hospital | Payer: Medicare Other | Attending: Internal Medicine | Admitting: Internal Medicine

## 2015-11-17 ENCOUNTER — Encounter (HOSPITAL_COMMUNITY): Payer: Self-pay

## 2015-11-17 ENCOUNTER — Emergency Department (HOSPITAL_COMMUNITY): Payer: Medicare Other

## 2015-11-17 DIAGNOSIS — Z87891 Personal history of nicotine dependence: Secondary | ICD-10-CM

## 2015-11-17 DIAGNOSIS — D62 Acute posthemorrhagic anemia: Secondary | ICD-10-CM | POA: Diagnosis not present

## 2015-11-17 DIAGNOSIS — J90 Pleural effusion, not elsewhere classified: Secondary | ICD-10-CM | POA: Diagnosis not present

## 2015-11-17 DIAGNOSIS — N62 Hypertrophy of breast: Secondary | ICD-10-CM

## 2015-11-17 DIAGNOSIS — I25118 Atherosclerotic heart disease of native coronary artery with other forms of angina pectoris: Secondary | ICD-10-CM | POA: Diagnosis present

## 2015-11-17 DIAGNOSIS — D649 Anemia, unspecified: Secondary | ICD-10-CM

## 2015-11-17 DIAGNOSIS — R319 Hematuria, unspecified: Secondary | ICD-10-CM

## 2015-11-17 DIAGNOSIS — K439 Ventral hernia without obstruction or gangrene: Secondary | ICD-10-CM | POA: Diagnosis present

## 2015-11-17 DIAGNOSIS — N2581 Secondary hyperparathyroidism of renal origin: Secondary | ICD-10-CM | POA: Diagnosis not present

## 2015-11-17 DIAGNOSIS — E1129 Type 2 diabetes mellitus with other diabetic kidney complication: Secondary | ICD-10-CM | POA: Diagnosis not present

## 2015-11-17 DIAGNOSIS — Z91041 Radiographic dye allergy status: Secondary | ICD-10-CM

## 2015-11-17 DIAGNOSIS — Z992 Dependence on renal dialysis: Secondary | ICD-10-CM | POA: Diagnosis not present

## 2015-11-17 DIAGNOSIS — D631 Anemia in chronic kidney disease: Secondary | ICD-10-CM | POA: Diagnosis present

## 2015-11-17 DIAGNOSIS — M109 Gout, unspecified: Secondary | ICD-10-CM | POA: Diagnosis present

## 2015-11-17 DIAGNOSIS — R0600 Dyspnea, unspecified: Secondary | ICD-10-CM

## 2015-11-17 DIAGNOSIS — N186 End stage renal disease: Secondary | ICD-10-CM | POA: Diagnosis not present

## 2015-11-17 DIAGNOSIS — Y830 Surgical operation with transplant of whole organ as the cause of abnormal reaction of the patient, or of later complication, without mention of misadventure at the time of the procedure: Secondary | ICD-10-CM | POA: Diagnosis present

## 2015-11-17 DIAGNOSIS — R0789 Other chest pain: Secondary | ICD-10-CM | POA: Diagnosis not present

## 2015-11-17 DIAGNOSIS — N029 Recurrent and persistent hematuria with unspecified morphologic changes: Secondary | ICD-10-CM | POA: Diagnosis present

## 2015-11-17 DIAGNOSIS — Z79899 Other long term (current) drug therapy: Secondary | ICD-10-CM

## 2015-11-17 DIAGNOSIS — T8619 Other complication of kidney transplant: Secondary | ICD-10-CM | POA: Diagnosis not present

## 2015-11-17 DIAGNOSIS — I120 Hypertensive chronic kidney disease with stage 5 chronic kidney disease or end stage renal disease: Secondary | ICD-10-CM | POA: Diagnosis not present

## 2015-11-17 DIAGNOSIS — D638 Anemia in other chronic diseases classified elsewhere: Secondary | ICD-10-CM | POA: Diagnosis present

## 2015-11-17 DIAGNOSIS — R059 Cough, unspecified: Secondary | ICD-10-CM

## 2015-11-17 DIAGNOSIS — E119 Type 2 diabetes mellitus without complications: Secondary | ICD-10-CM

## 2015-11-17 DIAGNOSIS — N200 Calculus of kidney: Secondary | ICD-10-CM | POA: Diagnosis not present

## 2015-11-17 DIAGNOSIS — Z7982 Long term (current) use of aspirin: Secondary | ICD-10-CM

## 2015-11-17 DIAGNOSIS — Z8249 Family history of ischemic heart disease and other diseases of the circulatory system: Secondary | ICD-10-CM

## 2015-11-17 DIAGNOSIS — R079 Chest pain, unspecified: Secondary | ICD-10-CM

## 2015-11-17 DIAGNOSIS — Z951 Presence of aortocoronary bypass graft: Secondary | ICD-10-CM

## 2015-11-17 DIAGNOSIS — I739 Peripheral vascular disease, unspecified: Secondary | ICD-10-CM | POA: Diagnosis present

## 2015-11-17 DIAGNOSIS — R946 Abnormal results of thyroid function studies: Secondary | ICD-10-CM | POA: Diagnosis present

## 2015-11-17 DIAGNOSIS — E1122 Type 2 diabetes mellitus with diabetic chronic kidney disease: Secondary | ICD-10-CM | POA: Diagnosis present

## 2015-11-17 DIAGNOSIS — N132 Hydronephrosis with renal and ureteral calculous obstruction: Secondary | ICD-10-CM | POA: Diagnosis present

## 2015-11-17 DIAGNOSIS — E781 Pure hyperglyceridemia: Secondary | ICD-10-CM | POA: Diagnosis present

## 2015-11-17 DIAGNOSIS — R05 Cough: Secondary | ICD-10-CM

## 2015-11-17 DIAGNOSIS — I10 Essential (primary) hypertension: Secondary | ICD-10-CM | POA: Diagnosis present

## 2015-11-17 DIAGNOSIS — T8612 Kidney transplant failure: Secondary | ICD-10-CM | POA: Diagnosis present

## 2015-11-17 LAB — I-STAT TROPONIN, ED: Troponin i, poc: 0.16 ng/mL (ref 0.00–0.08)

## 2015-11-17 LAB — URINALYSIS, ROUTINE W REFLEX MICROSCOPIC
Glucose, UA: 100 mg/dL — AB
Ketones, ur: 40 mg/dL — AB
Nitrite: POSITIVE — AB
Protein, ur: >300 mg/dL — AB
Specific Gravity, Urine: 1.035 — ABNORMAL HIGH (ref 1.005–1.030)
pH: 6 (ref 5.0–8.0)

## 2015-11-17 LAB — CBC
HCT: 20.2 % — ABNORMAL LOW (ref 39.0–52.0)
HCT: 23.8 % — ABNORMAL LOW (ref 39.0–52.0)
Hemoglobin: 6.4 g/dL — CL (ref 13.0–17.0)
Hemoglobin: 7.4 g/dL — ABNORMAL LOW (ref 13.0–17.0)
MCH: 28.4 pg (ref 26.0–34.0)
MCH: 27.3 pg (ref 26.0–34.0)
MCHC: 31.7 g/dL (ref 30.0–36.0)
MCHC: 31.1 g/dL (ref 30.0–36.0)
MCV: 89.8 fL (ref 78.0–100.0)
MCV: 87.8 fL (ref 78.0–100.0)
PLATELETS: 189 10*3/uL (ref 150–400)
Platelets: 169 K/uL (ref 150–400)
RBC: 2.25 MIL/uL — ABNORMAL LOW (ref 4.22–5.81)
RBC: 2.71 MIL/uL — AB (ref 4.22–5.81)
RDW: 18.3 % — ABNORMAL HIGH (ref 11.5–15.5)
RDW: 17.6 % — ABNORMAL HIGH (ref 11.5–15.5)
WBC: 5 K/uL (ref 4.0–10.5)
WBC: 5.9 10*3/uL (ref 4.0–10.5)

## 2015-11-17 LAB — URINE MICROSCOPIC-ADD ON: Bacteria, UA: NONE SEEN

## 2015-11-17 LAB — BASIC METABOLIC PANEL
Anion gap: 14 (ref 5–15)
BUN: 26 mg/dL — AB (ref 6–20)
CHLORIDE: 98 mmol/L — AB (ref 101–111)
CO2: 24 mmol/L (ref 22–32)
CREATININE: 11.3 mg/dL — AB (ref 0.61–1.24)
Calcium: 7 mg/dL — ABNORMAL LOW (ref 8.9–10.3)
GFR calc Af Amer: 5 mL/min — ABNORMAL LOW (ref >60)
GFR, EST NON AFRICAN AMERICAN: 4 mL/min — AB (ref >60)
GLUCOSE: 94 mg/dL (ref 65–99)
POTASSIUM: 3.4 mmol/L — AB (ref 3.5–5.1)
Sodium: 136 mmol/L (ref 135–145)

## 2015-11-17 LAB — GLUCOSE, CAPILLARY
Glucose-Capillary: 90 mg/dL (ref 65–99)
Glucose-Capillary: 89 mg/dL (ref 65–99)

## 2015-11-17 LAB — BASIC METABOLIC PANEL WITH GFR
Anion gap: 15 (ref 5–15)
BUN: 22 mg/dL — ABNORMAL HIGH (ref 6–20)
CO2: 24 mmol/L (ref 22–32)
Calcium: 7.1 mg/dL — ABNORMAL LOW (ref 8.9–10.3)
Chloride: 97 mmol/L — ABNORMAL LOW (ref 101–111)
Creatinine, Ser: 10.95 mg/dL — ABNORMAL HIGH (ref 0.61–1.24)
GFR calc Af Amer: 5 mL/min — ABNORMAL LOW
GFR calc non Af Amer: 4 mL/min — ABNORMAL LOW
Glucose, Bld: 93 mg/dL (ref 65–99)
Potassium: 3.2 mmol/L — ABNORMAL LOW (ref 3.5–5.1)
Sodium: 136 mmol/L (ref 135–145)

## 2015-11-17 LAB — PROTIME-INR
INR: 1.19 (ref 0.00–1.49)
PROTHROMBIN TIME: 15.3 s — AB (ref 11.6–15.2)

## 2015-11-17 LAB — BRAIN NATRIURETIC PEPTIDE: B Natriuretic Peptide: 2608.9 pg/mL — ABNORMAL HIGH (ref 0.0–100.0)

## 2015-11-17 LAB — PREPARE RBC (CROSSMATCH)

## 2015-11-17 LAB — TROPONIN I
TROPONIN I: 0.12 ng/mL — AB (ref <0.031)
Troponin I: 0.12 ng/mL — ABNORMAL HIGH (ref <0.031)

## 2015-11-17 MED ORDER — CALCIUM CARBONATE 1250 (500 CA) MG PO TABS
1.0000 | ORAL_TABLET | Freq: Every day | ORAL | Status: DC
  Administered 2015-11-18 – 2015-11-23 (×5): 500 mg via ORAL
  Filled 2015-11-17 (×5): qty 1

## 2015-11-17 MED ORDER — HYDROCODONE-ACETAMINOPHEN 5-325 MG PO TABS: 1.0000 | ORAL_TABLET | ORAL | Status: DC | PRN

## 2015-11-17 MED ORDER — POLYETHYLENE GLYCOL 3350 17 G PO PACK: 17.0000 g | PACK | Freq: Every day | ORAL | Status: DC | PRN

## 2015-11-17 MED ORDER — ACETAMINOPHEN 325 MG PO TABS
650.0000 mg | ORAL_TABLET | Freq: Four times a day (QID) | ORAL | Status: DC | PRN
  Administered 2015-11-17 – 2015-11-23 (×3): 650 mg via ORAL
  Filled 2015-11-17 (×2): qty 2

## 2015-11-17 MED ORDER — ONDANSETRON HCL 4 MG PO TABS: 4.0000 mg | ORAL_TABLET | Freq: Four times a day (QID) | ORAL | Status: DC | PRN

## 2015-11-17 MED ORDER — BISACODYL 5 MG PO TBEC: 5.0000 mg | DELAYED_RELEASE_TABLET | Freq: Every day | ORAL | Status: DC | PRN

## 2015-11-17 MED ORDER — ACETAMINOPHEN 325 MG PO TABS
ORAL_TABLET | ORAL | Status: AC
  Filled 2015-11-17: qty 2

## 2015-11-17 MED ORDER — INSULIN ASPART 100 UNIT/ML ~~LOC~~ SOLN: 0.0000 [IU] | Freq: Three times a day (TID) | SUBCUTANEOUS | Status: DC

## 2015-11-17 MED ORDER — ACETAMINOPHEN 650 MG RE SUPP: 650.0000 mg | Freq: Four times a day (QID) | RECTAL | Status: DC | PRN

## 2015-11-17 MED ORDER — POTASSIUM CHLORIDE CRYS ER 20 MEQ PO TBCR: 20.0000 meq | EXTENDED_RELEASE_TABLET | Freq: Once | ORAL | Status: DC

## 2015-11-17 MED ORDER — IPRATROPIUM-ALBUTEROL 0.5-2.5 (3) MG/3ML IN SOLN
3.0000 mL | Freq: Once | RESPIRATORY_TRACT | Status: AC
  Administered 2015-11-17: 3 mL via RESPIRATORY_TRACT
  Filled 2015-11-17: qty 3

## 2015-11-17 MED ORDER — ONDANSETRON HCL 4 MG/2ML IJ SOLN
4.0000 mg | Freq: Four times a day (QID) | INTRAMUSCULAR | Status: DC | PRN
  Administered 2015-11-17: 4 mg via INTRAVENOUS

## 2015-11-17 MED ORDER — GI COCKTAIL ~~LOC~~: 30.0000 mL | Freq: Four times a day (QID) | ORAL | Status: DC | PRN

## 2015-11-17 MED ORDER — SODIUM CHLORIDE 0.9 % IV SOLN
10.0000 mL/h | Freq: Once | INTRAVENOUS | Status: AC
  Administered 2015-11-17: 10 mL/h via INTRAVENOUS

## 2015-11-17 MED ORDER — ONDANSETRON HCL 4 MG/2ML IJ SOLN
INTRAMUSCULAR | Status: AC
  Filled 2015-11-17: qty 2

## 2015-11-17 MED ORDER — ASPIRIN EC 81 MG PO TBEC
81.0000 mg | DELAYED_RELEASE_TABLET | Freq: Every day | ORAL | Status: DC
  Administered 2015-11-18 – 2015-11-23 (×6): 81 mg via ORAL
  Filled 2015-11-17 (×6): qty 1

## 2015-11-17 MED ORDER — CARVEDILOL 25 MG PO TABS
25.0000 mg | ORAL_TABLET | Freq: Two times a day (BID) | ORAL | Status: DC
  Administered 2015-11-18 – 2015-11-23 (×11): 25 mg via ORAL
  Filled 2015-11-17 (×10): qty 1

## 2015-11-17 MED ORDER — COLCHICINE 0.6 MG PO TABS
0.6000 mg | ORAL_TABLET | Freq: Every day | ORAL | Status: DC
  Administered 2015-11-18 – 2015-11-19 (×2): 0.6 mg via ORAL
  Filled 2015-11-17 (×2): qty 1

## 2015-11-17 MED ORDER — IPRATROPIUM-ALBUTEROL 0.5-2.5 (3) MG/3ML IN SOLN: 3.0000 mL | RESPIRATORY_TRACT | Status: DC | PRN

## 2015-11-17 MED ORDER — SODIUM CHLORIDE 0.9% FLUSH
3.0000 mL | Freq: Two times a day (BID) | INTRAVENOUS | Status: DC
  Administered 2015-11-17 – 2015-11-23 (×12): 3 mL via INTRAVENOUS

## 2015-11-17 NOTE — Consult Note (Signed)
[facility] Renal Consultation Note  Indication for Consultation:  Management of ESRD/hemodialysis; anemia, hypertension/volume and secondary hyperparathyroidism  HPI: Patrick [name] is a 60 y.o. male presented to OP HD today co Chest pain sent to ER (without  HD done) . Is compliant with TTS [facility] schedule).HO Kidney  Transplant [facility] 2008 rejection episode 2015 and failed - HD started 09/15/2014 at [place] has been having Co Hematuria as op , last seen 4/18 at center and Dr. Marval Regal ordered Urology Consult / op HGB trending downward 7.4 (4/20)<8.4 <7.2<7.0/ given 1 u PRBCS as OP 11/07/15 per Dr. Justin Mend. Now in ER  hgb 6.4 . He gives HO weakness, fatigue, decreased appetite, rare back pain, denies frank melena/stool dark when started Velphora binder as expected(iron)denies fevers , chills N/V/D or abd pain .    Now in ER Chest pain has resolved,. CT scan showing Kidney stone in Transplant kidney.Cxr shows=No focal or consolidations or pulmonary edema .We are consulted for HD /Renal issues      Past Medical History  Diagnosis Date  . Hypertension   . Diabetes mellitus without complication ([place])   . End-stage renal failure with renal transplant ([place])   . Gout   . Impotence of organic origin   . Type I (juvenile type) diabetes mellitus with renal manifestations, not stated as uncontrolled   . Secondary hyperparathyroidism [facility])     Past Surgical History  Procedure Laterality Date  . Birth defect heart surgery  1996  . Kidney transplant  2009  . Appendectomy    . Av fistula placement Right 09/18/2014    Procedure: INSERTION OF ARTERIOVENOUS (AV) GORE-TEX GRAFT ARM USING 4-7MM  X 45CM STRETCH GORE-TEX VASCULAR GRAFT;  Surgeon: Rosetta Posner, MD;  Location: [facility] OR;  Service: Vascular;  Laterality: Right;      Family History  Problem Relation Age of Onset  . Hyperlipidemia Mother   . Hypertension Mother       reports that he has quit smoking. He does not have any smokeless  tobacco history on file. He reports that he does not drink alcohol or use illicit drugs.   Allergies  Allergen Reactions  . Iodinated Diagnostic Agents Rash    Other Reaction: burning to mouth, swelling of lips.    Prior to Admission medications   Medication Sig Start Date End Date Taking? Authorizing Provider  aspirin 81 MG tablet Take 81 mg by mouth daily.   Yes Historical Provider, MD  calcium carbonate (OS-CAL - DOSED IN MG OF ELEMENTAL CALCIUM) 1250 (500 Ca) MG tablet Take 1 tablet by mouth daily with breakfast.   Yes Historical Provider, MD  carvedilol (COREG) 25 MG tablet Take 25 mg by mouth 2 (two) times daily with a meal.   Yes Historical Provider, MD  colchicine 0.6 MG tablet Take 1 tablet (0.6 mg total) by mouth daily. 10/27/15  Yes Reyne Dumas, MD  repaglinide (PRANDIN) 0.5 MG tablet Take 1 tablet (0.5 mg total) by mouth 2 (two) times daily before a meal. 06/06/15  Yes Renato Shin, MD  calcitRIOL (ROCALTROL) 0.5 MCG capsule Take 3 capsules (1.5 mcg total) by mouth daily. Patient not taking: Reported on 10/30/2015 11/30/13   Modena Jansky, MD     Continuous:  Anti-infectives    None      Results for orders placed or performed during the hospital encounter of 11/17/15 (from the past 48 hour(s))  I-stat troponin, ED     Status: Abnormal   Collection Time: 11/17/15  7:58 AM  Result Value Ref Range   Troponin i, poc 0.16 (HH) 0.00 - 0.08 ng/mL   Comment NOTIFIED PHYSICIAN    Comment 3            Comment: Due to the release kinetics of cTnI, a negative result within the first hours of the onset of symptoms does not rule out myocardial infarction with certainty. If myocardial infarction is still suspected, repeat the test at appropriate intervals.   Basic metabolic panel     Status: Abnormal   Collection Time: 11/17/15  7:59 AM  Result Value Ref Range   Sodium 136 135 - 145 mmol/L   Potassium 3.2 (L) 3.5 - 5.1 mmol/L   Chloride 97 (L) 101 - 111 mmol/L   CO2 24 22 - 32  mmol/L   Glucose, Bld 93 65 - 99 mg/dL   BUN 22 (H) 6 - 20 mg/dL   Creatinine, Ser 10.95 (H) 0.61 - 1.24 mg/dL   Calcium 7.1 (L) 8.9 - 10.3 mg/dL   GFR calc non Af Amer 4 (L) >60 mL/min   GFR calc Af Amer 5 (L) >60 mL/min    Comment: (NOTE) The eGFR has been calculated using the CKD EPI equation. This calculation has not been validated in all clinical situations. eGFR's persistently <60 mL/min signify possible Chronic Kidney Disease.    Anion gap 15 5 - 15  CBC     Status: Abnormal   Collection Time: 11/17/15  7:59 AM  Result Value Ref Range   WBC 5.0 4.0 - 10.5 K/uL   RBC 2.25 (L) 4.22 - 5.81 MIL/uL   Hemoglobin 6.4 (LL) 13.0 - 17.0 g/dL    Comment: REPEATED TO VERIFY CRITICAL RESULT CALLED TO, READ BACK BY AND VERIFIED WITH: E HOWELL,RN 11/17/15 0826 RHOLMES    HCT 20.2 (L) 39.0 - 52.0 %   MCV 89.8 78.0 - 100.0 fL   MCH 28.4 26.0 - 34.0 pg   MCHC 31.7 30.0 - 36.0 g/dL   RDW 18.3 (H) 11.5 - 15.5 %   Platelets 169 150 - 400 K/uL  Brain natriuretic peptide     Status: Abnormal   Collection Time: 11/17/15  7:59 AM  Result Value Ref Range   B Natriuretic Peptide 2608.9 (H) 0.0 - 100.0 pg/mL  Prepare RBC     Status: None   Collection Time: 11/17/15  8:59 AM  Result Value Ref Range   Order Confirmation ORDER PROCESSED BY BLOOD BANK   Type and screen [place]     Status: None (Preliminary result)   Collection Time: 11/17/15  9:06 AM  Result Value Ref Range   ABO/RH(D) O POS    Antibody Screen NEG    Sample Expiration 11/20/2015    Unit Number [id number]    Blood Component Type RED CELLS,LR    Unit division 00    Status of Unit ALLOCATED    Transfusion Status OK TO TRANSFUSE    Crossmatch Result Compatible    Unit Number [id number]    Blood Component Type RBC LR PHER1    Unit division 00    Status of Unit ISSUED    Transfusion Status OK TO TRANSFUSE    Crossmatch Result Compatible   Urinalysis, Routine w reflex microscopic (not at [facility])      Status: Abnormal   Collection Time: 11/17/15  9:10 AM  Result Value Ref Range   Color, Urine RED (A) YELLOW    Comment: BIOCHEMICALS MAY BE AFFECTED BY  COLOR   APPearance TURBID (A) CLEAR   Specific Gravity, Urine 1.035 (H) 1.005 - 1.030   pH 6.0 5.0 - 8.0   Glucose, UA 100 (A) NEGATIVE mg/dL   Hgb urine dipstick LARGE (A) NEGATIVE   Bilirubin Urine LARGE (A) NEGATIVE   Ketones, ur 40 (A) NEGATIVE mg/dL   Protein, ur >300 (A) NEGATIVE mg/dL   Nitrite POSITIVE (A) NEGATIVE   Leukocytes, UA LARGE (A) NEGATIVE  Urine microscopic-add on     Status: Abnormal   Collection Time: 11/17/15  9:10 AM  Result Value Ref Range   Squamous Epithelial / LPF 0-5 (A) NONE SEEN   WBC, UA 0-5 0 - 5 WBC/hpf   RBC / HPF TOO NUMEROUS TO COUNT 0 - 5 RBC/hpf   Bacteria, UA NONE SEEN NONE SEEN    ROS: see hpi  Physical Exam: Filed Vitals:   11/17/15 1200 11/17/15 1213  BP: 153/89 153/89  Pulse: 86 90  Temp:    Resp: 21 16     General: Alert Obese AAM, NA D, Appropriate/OX3 HEENT: [place] MMM  Neck: no jvd Heart: RRR, no rub, mur, gallop Lungs: CTA  Abdomen: obese, Soft , NT, ND, Rlower quad Kid transplant area nontender Extremities: no pedal edema Skin: no overt rash Neuro: alert OX#3no acute focal deficifts Dialysis Access: pos bruit  RUA AVF  Dialysis Orders: Center: [facility]  on TTT . EDW 98.5 kg HD Bath 2k, 2,5 ca  Time 4hrs Heparin 1600. Access RUA AVG     Hec 2 mcg IV/HD, mircera 130mg q 2wks last on 11/13/15   Units IV/HD   Other op labs  hgb as above    Assessment/Plan  1. ESRD/ Ho renal transplant failure(not tx meds) -  HD todayTTS schedule  use no hep. k 3.2 reck use added k bath  2. Anemia- multiple etiologies-ESRD/ Hematuria with kid stone= wu per admit 3. Chest pain - wu per admit/  ??anemia playing role in it  Troponin 0.16 In esrd pt  4. Hypertension/volume  - bp stable in er 134/ fu wts 5. 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 Metabolic bone disease with  Mild Hypocalcemia as op  -ca corrected to 8.1  Noted not takeing op PO calcitrol q day   Use 2.5 Ca bath / / tells me uses Velphora as binder per his report/ fu ca/phos  Use Oscal in hosp.  7. DM type 2- per admit  DErnest Haber PA-C [id number]/22/2017, 1:21 PM   Pt seen, examined and agree w A/P as above.  RKelly SplinterMD CNewell Rubbermaidpager [phone]   cell [phone]/23/2017, 9:15 AM

## 2015-11-17 NOTE — H&P (Signed)
History and Physical    [name] [id number] DOB: 11-18-1955 DOA: 11/17/2015  Referring MD/NP/PA:  Josephina Gip, P.A-C PCP: Elyn Peers, MD  Outpatient Specialists: Nephrologist - Erling Cruz, MD Patient coming from:  Home  Chief Complaint: chest pain, shortness of breath  HPI: Patrick [name] is a 60 y.o. male with medical history significant for ESRD, s/p failed kidney transplant. He dialyzes Tues / Thurs / and Sat. Known to Dr. Florene Glen. Around 10pm last night when lying down to sleep patient developed centralized chest pressure radiating down left arm. Pain last only seconds but later returned and patient called EMS. Pain resolved by time he got into ambulance. Chest pain not associated with nausea or diaphoresis. Additionally,  difficult to get exact answer but patient has been having SOB for two months and it may have become acutely worse last night. Patient couldn't sleep flat last night but at the same time doesn't think shortness of breath was any worse. He has had a cough for two months, often productive of clear / white phlegm.   Patient has been having frank hematuria for a month now. He is scheduled to see a urologist next week. No dysuria. He has lost weight but attributes that to being off anti-rejection medications. Patient transfused a unit of blood last week. He has no blood in stools, just gross hematuria.   Patient concerned about growth of breast tissue which started a couple of months ago. Both breasts have starting growing, left > right. The left breast is intermittently tender with sensitive nipple. No discharge.   ED Course:  BNP 2608, trop 0.16 hgb 6.4 down from baseline of 8.3  U/a with + nitrites and TNTC RBCs  Review of Systems: As per HPI otherwise 10 point review of systems negative.   Past Medical History  Diagnosis Date  . Hypertension   . Diabetes mellitus without complication ([place])   . End-stage renal failure with renal transplant ([place])   . Gout    . Impotence of organic origin   . Type I (juvenile type) diabetes mellitus with renal manifestations, not stated as uncontrolled   . Secondary hyperparathyroidism [facility])     Past Surgical History  Procedure Laterality Date  . Birth defect heart surgery  1996  . Kidney transplant  2009  . Appendectomy    . Av fistula placement Right 09/18/2014    Procedure: INSERTION OF ARTERIOVENOUS (AV) GORE-TEX GRAFT ARM USING 4-7MM  X 45CM STRETCH GORE-TEX VASCULAR GRAFT;  Surgeon: Rosetta Posner, MD;  Location: [place];  Service: Vascular;  Laterality: Right;     reports that he has quit smoking. He does not have any smokeless tobacco history on file. He reports that he does not drink alcohol or use illicit drugs.  Allergies  Allergen Reactions  . Iodinated Diagnostic Agents Rash    Other Reaction: burning to mouth, swelling of lips.    Family History  Problem Relation Age of Onset  . Hyperlipidemia Mother   . Hypertension Mother     Prior to Admission medications   Medication Sig Start Date End Date Taking? Authorizing Provider  aspirin 81 MG tablet Take 81 mg by mouth daily.   Yes Historical Provider, MD  calcium carbonate (OS-CAL - DOSED IN MG OF ELEMENTAL CALCIUM) 1250 (500 Ca) MG tablet Take 1 tablet by mouth daily with breakfast.   Yes Historical Provider, MD  carvedilol (COREG) 25 MG tablet Take 25 mg by mouth 2 (two) times daily with a meal.  Yes Historical Provider, MD  colchicine 0.6 MG tablet Take 1 tablet (0.6 mg total) by mouth daily. 10/27/15  Yes Reyne Dumas, MD  repaglinide (PRANDIN) 0.5 MG tablet Take 1 tablet (0.5 mg total) by mouth 2 (two) times daily before a meal. 06/06/15  Yes Renato Shin, MD  calcitRIOL (ROCALTROL) 0.5 MCG capsule Take 3 capsules (1.5 mcg total) by mouth daily. Patient not taking: Reported on 10/30/2015 11/30/13   Modena Jansky, MD    Physical Exam: Filed Vitals:   11/17/15 1115 11/17/15 1155 11/17/15 1200 11/17/15 1213  BP: 161/96 145/95 153/89 153/89    Pulse: 109 89 86 90  Temp:  98.1 F (36.7 C)    TempSrc:  Oral    Resp: 20 18 21 16   Weight:      SpO2: 93% 96% 95% 96%      Constitutional: NAD, calm, comfortable Filed Vitals:   11/17/15 1115 11/17/15 1155 11/17/15 1200 11/17/15 1213  BP: 161/96 145/95 153/89 153/89  Pulse: 109 89 86 90  Temp:  98.1 F (36.7 C)    TempSrc:  Oral    Resp: 20 18 21 16   Weight:      SpO2: 93% 96% 95% 96%   Eyes: PER, lids and conjunctivae normal ENMT: Mucous membranes are moist. Posterior pharynx clear of any exudate or lesions.Normal dentition.  Neck: normal, supple, no masses Respiratory: A few bibasilar crackles, lungs o/w CTA bilaterally.  Cardiovascular: Regular rate and rhythm, no murmurs / rubs / gallops. No extremity edema. 2+ pedal pulses. No carotid bruits.  Abdomen: no tenderness, no masses palpated. No hepatosplenomegaly. Bowel sounds positive.  Breast: mild enlargement / fullness of left breast Musculoskeletal: no clubbing / cyanosis. No joint deformity upper and lower extremities. Good ROM, no contractures. Normal muscle tone.  Skin: no rashes, lesions, ulcers. No induration Neurologic: CN 2-12 grossly intact. Sensation intact, DTR normal. Strength 5/5 in all 4.  Psychiatric: Normal judgment and insight. Alert and oriented x 3. Normal mood.   Labs on Admission: I have personally reviewed following labs and imaging studies  CBC:  Recent Labs Lab 11/17/15 0759  WBC 5.0  HGB 6.4*  HCT 20.2*  MCV 89.8  PLT 123XX123   Basic Metabolic Panel:  Recent Labs Lab 11/17/15 0759  NA 136  K 3.2*  CL 97*  CO2 24  GLUCOSE 93  BUN 22*  CREATININE 10.95*  CALCIUM 7.1*   Urine analysis:    Component Value Date/Time   COLORURINE RED* 11/17/2015 0910   APPEARANCEUR TURBID* 11/17/2015 0910   LABSPEC 1.035* 11/17/2015 0910   PHURINE 6.0 11/17/2015 0910   GLUCOSEU 100* 11/17/2015 0910   HGBUR LARGE* 11/17/2015 0910   BILIRUBINUR LARGE* 11/17/2015 0910   KETONESUR 40*  11/17/2015 0910   PROTEINUR >300* 11/17/2015 0910   UROBILINOGEN 0.2 11/24/2013 0242   NITRITE POSITIVE* 11/17/2015 0910   LEUKOCYTESUR LARGE* 11/17/2015 0910    Radiological Exams on Admission: Ct Abdomen Pelvis Wo Contrast  11/17/2015  CLINICAL DATA:  Low abdominal pain and hematuria beginning 1 day ago. History of a kidney transplant. EXAM: CT ABDOMEN AND PELVIS WITHOUT CONTRAST TECHNIQUE: Multidetector CT imaging of the abdomen and pelvis was performed following the standard protocol without IV contrast. COMPARISON:  None. FINDINGS: Lung bases: Heart normal in size. There are dense coronary artery calcifications. Trace right pleural fluid. Lung base subsegmental atelectasis. Ill-defined nodular opacity the left lower lobe measures 6 mm. Smaller partly imaged nodular opacity lies right upper lobe of the liver  fissure. No evidence of pneumonia or pulmonary edema. Liver, spleen, gallbladder, pancreas, adrenal glands:  Unremarkable. Kidneys, ureters, bladder: Marked atrophy of the native kidneys with multiple bilateral cysts. No hydronephrosis. Normal native ureters. Right pelvic renal transplant shows perinephric stranding. The collecting system is not well-defined but appears least mildly dilated. The transplant ureter is dilated with periureteral stranding. A 3.8 mm stone projects at the renal transplant ureterovesicular junction. Bladder is mostly decompressed but otherwise unremarkable. Vascular: There are dense atherosclerotic calcifications along the abdominal aorta and its branch vessels. No aneurysm. Lymph nodes:  No pathologically enlarged lymph nodes. Ascites:  None. Gastrointestinal/ abdominal wall: Large right mid to lower quadrant hernia which contains small bowel without evidence of obstruction, strangulation or incarceration. There is a small fat containing umbilical hernia. There are scattered colonic diverticula. No diverticulitis. Appendix not visualized. Musculoskeletal:  No osteoblastic  or osteolytic lesions. IMPRESSION: 1. 3.8 mm stone lies at the ureterovesicular junction of the transplant kidney causing at least mild transplant kidney hydronephrosis and moderate hydroureter as well as periureteral and perinephric stranding. 2. No other acute findings. Electronically Signed   By: Lajean Manes M.D.   On: 11/17/2015 11:50   Dg Chest 2 View  11/17/2015  CLINICAL DATA:  Mid chest pain began last night with pain radiating into left arm with numbness/tingling left hand and fingers. SOB x 1 month. Hx of HTN- on meds; diabetic. EXAM: CHEST  2 VIEW COMPARISON:  10/30/2015 FINDINGS: Status post median sternotomy. Surgical clips are noted in the superior mediastinum. Right stent graft again noted. The heart is enlarged. Bilateral pleural effusions are present. No focal consolidations or pulmonary edema. IMPRESSION: 1. Cardiomegaly. 2. Small bilateral pleural effusions. Electronically Signed   By: Nolon Nations M.D.   On: 11/17/2015 08:45    EKG: Independently reviewed.   EKG Interpretation  Date/Time:  Saturday November 17 2015 07:29:00 EDT Ventricular Rate:  88 PR Interval:  115 QRS Duration: 98 QT Interval:  427 QTC Calculation: 517 R Axis:   60 Text Interpretation:  Sinus rhythm Borderline short PR interval Abnrm T, consider ischemia, anterolateral lds Prolonged QT interval Baseline wander in lead(s) II III aVR aVL aVF Lateral ischemia seen on prior EKGs. TWI in V2 seems more prominent.  Confirmed by Oleta Mouse MD, Hinton Dyer [id number]) on 11/17/2015 7:34:11 AM Also confirmed by LIU MD, DANA [phone]), editor Rolla Plate, Joelene Millin [phone])  on 11/17/2015 11:34:56 AM    Assessment/Plan  Non-exertional chest pain / dyspnea. Chest pain resolved. Dyspnea, present for two months but possibly worse now. Heart score 4.  Dyspnea and chest pain both possibly secondary to acute on chronic anemia. His BNP is markedly elevated but has ESRD and due for dialysis today.  -Place in Observation - telemetry bed -chest pain  admission orders utilized -cycle troponins -am EKG  Cough, productive of clear phlegm. Recently treated for HCAP. No pna on today's cxr. Afebrile. -prn duonebs   ESRD, failed transplant. Dialyzes Tu / Thur/ Sat. Nephrology aware. Patient due for dialysis today. K+ normal.  Normocytic anemia. He has chronic anemia with wide variations in hgb from mid 8 to mid 11 range. Recieved a unit of blood 2 weeks ago for hgb of 7.9. Despite transfusion his hgb has further declined to 6.4. Suspect progressive anemia secondary to ongoing gross hematuria.  -He is getting 1st of 2 units of blood in ED -Follow up CBC  Gross hematuria / renal stone. CTscan reveals 8 mm stone at the ureterovesicular junction of the transplant kidney  with mild transplant kidney hydronephrosis and moderate hydroureter as well as periureteral and perinephric stranding.  -Spoke with Urology over the phone, Dr. Pilar Jarvis who recommended we consult IR for Nephrostomy since transplanted kidney can't be stented..  -Spoke with IR - Dr. Vernard Gambles. Large ventral hernia complicates things but will start with an ultrasound to look for path to place nephrostomy tube in transplanted kidney. -send urine for culture - positive nitrites -check coags  Essential hypertension. Controlled in ED. -continue home BP meds  Diabetes mellitus type 2, controlled ([place]). Now off insulin and on Prandin. A1c 5.4 2 weeks ago -hold Prandin -CBGs, SSI.  Gynecomastia. Breast tissue growth L > R over last couple of months. To see Endocrinology soon. Complains of intermittent discomfort in left breast. Denies recent use of aldactone  Large RLQ ventral hernia, chronic.  DVT prophylaxis:  SCD Code Status: Full code Family Communication: Discussed with patient and his wife in the room and they both understand and agree with plan of treatment.  Disposition Plan: Home in next 24-48 hours Consults called: Nephrology Admission status:  Observation   Tye Savoy  NP [facility] Pager [phone]  If 7PM-7AM, please contact night-coverage [email or web address] Password [facility]  11/17/2015, 12:20 PM     Addendum  I personally evaluated on 11/17/2015 and agree with above findings. Mr Patrick [name] is a pleasant 60 year old gentleman with a past medical history of end-stage renal disease, status post renal transplant, currently on the Tuesday Thursday and Saturday dialysis schedule. He presents to the emergency room with complaints of shortness of breath along with generalized weakness. Lab work in the emergency department revealed a hemoglobin of 6.4, down from 8.3 on 10/30/2015. Reports having intermittent hematuria at times noting grossly bloody urine. A CT scan of abdomen and pelvis performed on 11/17/2015 showed a 3.8 mm stone line at the ureterovesicular junction of the transplant kidney causing mild transplant kidney hydronephrosis. Radiology also reported moderate hydroureter as well as periureteral and perinephric stranding. I discussed case with Dr.Budzyn all urology to state he would not be able to place stent with transplanted kidney and recommended interventional radiology. Case was discussed with Dr. Vernard Gambles of IR who reviewed scans. Large right mid lower quadrant hernia could make access difficult. Will attempt with ultrasound. He was typed and crossed and was started on blood transfusion in the emergency department.

## 2015-11-17 NOTE — ED Notes (Signed)
Pt. Coming from dialysis via [facility] for chest pain that started 1030pm last night. Pt. sts the pressure was in the center of the chest with numbness going down to left arm. Pt. Also c/o SOB. Pt. Diagnosed with pneumonia over the past month as well as had a blood transfusion for anemia last week. Pt. sts his cough has been around for about a month also. Pt. sts pain is worse when laying down. Pt. Oxygen saturations at baseline on room air. Pt. sts the chest pressure has subsided. Pt. Given 324 ASA en route.

## 2015-11-17 NOTE — ED Provider Notes (Signed)
CSN: [id number]     Arrival date & time 11/17/15  [id number] History   First MD Initiated Contact with Patient 11/17/15 [phone]     Chief Complaint  Patient presents with  . Chest Pain   HPI  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 Onset of chest pain was approximately 10:30 PM last evening. Patient states he was lying in bed when he felt gradual onset of central chest pressure. He also complains of numbness radiating down his left arm. He is unsure if the chest pain was exertional as he did not attempt to get out of bed. Denies alleviating or exacerbating factors. He attempted to sleep last evening but could not get comfortable due to the chest pain. He presented to his dialysis clinic and told them about his chest pain. They called EMS and he was tranferred to [facility]. She reports resolution of chest pain and arm numbness while in EMS. He states this was before he received aspirin. He has not had recurrence of his chest pain since being in the emergency department. He also complains of shortness of breath but states this has been persistent since his hospitalization for pneumonia in March 2017. He notes that it did seem to worsen last evening with his episode of chest pain. His shortness breath is worse when he lays flat and is relieved by sitting up straight. He reports intermittent wheezing but does not feel that he is wheezing currently. He also complains of a persistent cough since his pneumonia diagnosis. The cough is unchanged. It is productive of clear phlegm. Denies hemoptysis. Also notes a month long history of painless frank hematuria. Pt only urinates every 3-4 but states it has had bright red blood in it each time. He has follow up with urology scheduled in 3 weeks.   Chart review: patient hospitalized for  hypervolemia and SOB on 4/4. Patient hospitalized for CAP in March 2017. No formal cardiology consult at either time. Last echo was 2015.   Past Medical History  Diagnosis Date  . Hypertension   . Diabetes mellitus without complication ([place])   . End-stage renal failure with renal transplant ([place])   . Gout   . Impotence of organic origin   . Type I (juvenile type) diabetes mellitus with renal manifestations, not stated as uncontrolled   . Secondary hyperparathyroidism [facility])    Past Surgical History  Procedure Laterality Date  . Birth defect heart surgery  1996  . Kidney transplant  2009  . Appendectomy    . Av fistula placement Right 09/18/2014    Procedure: INSERTION OF ARTERIOVENOUS (AV) GORE-TEX GRAFT ARM USING 4-7MM  X 45CM STRETCH GORE-TEX VASCULAR GRAFT;  Surgeon: Rosetta Posner, MD;  Location: [facility] OR;  Service: Vascular;  Laterality: Right;   Family History  Problem Relation Age of Onset  . Hyperlipidemia Mother   . Hypertension Mother    Social History  Substance Use Topics  . Smoking status: Former Research scientist (life sciences)  . Smokeless tobacco: None  . Alcohol Use: No    Review of Systems  Respiratory: Positive for cough, shortness of breath and wheezing.   Cardiovascular: Positive for chest pain.  Neurological: Positive for numbness.  All other systems reviewed and are negative.     Allergies  Iodinated diagnostic agents  Home Medications   Prior to Admission medications  Medication Sig Start Date End Date Taking? Authorizing Provider  aspirin 81 MG tablet Take 81 mg by mouth daily.   Yes Historical Provider, MD  calcium carbonate (OS-CAL - DOSED IN MG OF ELEMENTAL CALCIUM) 1250 (500 Ca) MG tablet Take 1 tablet by mouth daily with breakfast.   Yes Historical Provider, MD  carvedilol (COREG) 25 MG tablet Take 25 mg by mouth 2 (two) times daily with a meal.   Yes Historical Provider, MD  colchicine 0.6 MG tablet Take 1 tablet (0.6 mg total) by mouth daily. 10/27/15  Yes Reyne Dumas, MD  repaglinide (PRANDIN) 0.5 MG tablet Take 1 tablet (0.5 mg total) by mouth 2 (two) times daily before a meal. 06/06/15  Yes Renato Shin, MD  calcitRIOL (ROCALTROL) 0.5 MCG capsule Take 3 capsules (1.5 mcg total) by mouth daily. Patient not taking: Reported on 10/30/2015 11/30/13   Modena Jansky, MD   BP 153/89 mmHg  Pulse 90  Temp(Src) 98.1 F (36.7 C) (Oral)  Resp 16  Wt 102.059 kg  SpO2 96% Physical Exam  Constitutional: He appears well-developed and well-nourished. No distress.  HENT:  Head: Normocephalic and atraumatic.  Mouth/Throat: Oropharynx is clear and moist.  Eyes: Conjunctivae and EOM are normal. Pupils are equal, round, and reactive to light. Right eye exhibits no discharge. Left eye exhibits no discharge. No scleral icterus.  Neck: Normal range of motion. Neck supple.  Cardiovascular: Normal rate, regular rhythm, normal heart sounds and intact distal pulses.   No murmur heard. No peripheral edema.   Pulmonary/Chest: Effort normal. No respiratory distress. He has wheezes. He has rales.  Speaking in full sentences. No respiratory distress. Bibasilar rales with scant wheezing in mid-lung fields.   Abdominal: Soft. He exhibits no distension. There is no tenderness.  Musculoskeletal: Normal range of motion.  Graft in RUE with palpable thrill  Neurological: He is alert. Coordination normal.  Skin: Skin is warm and dry.  Psychiatric: He has a normal mood and affect. His behavior is normal.  Nursing note and vitals reviewed.   ED Course  Procedures (including critical care time) Labs Review Labs Reviewed  BASIC METABOLIC PANEL - Abnormal; Notable for the following:    Potassium 3.2 (*)    Chloride 97 (*)    BUN 22 (*)    Creatinine, Ser 10.95 (*)    Calcium 7.1 (*)    GFR calc non Af Amer 4 (*)    GFR calc Af Amer 5 (*)    All other components within normal limits  CBC - Abnormal; Notable for the following:    RBC 2.25 (*)    Hemoglobin 6.4 (*)    HCT  20.2 (*)    RDW 18.3 (*)    All other components within normal limits  BRAIN NATRIURETIC PEPTIDE - Abnormal; Notable for the following:    B Natriuretic Peptide 2608.9 (*)    All other components within normal limits  URINALYSIS, ROUTINE W REFLEX MICROSCOPIC (NOT AT [facility]) - Abnormal; Notable for the following:    Color, Urine RED (*)    APPearance TURBID (*)    Specific Gravity, Urine 1.035 (*)    Glucose, UA 100 (*)    Hgb urine dipstick LARGE (*)    Bilirubin Urine LARGE (*)    Ketones, ur 40 (*)    Protein, ur >300 (*)    Nitrite POSITIVE (*)    Leukocytes, UA LARGE (*)    All other components within normal limits  URINE MICROSCOPIC-ADD  ON - Abnormal; Notable for the following:    Squamous Epithelial / LPF 0-5 (*)    All other components within normal limits  I-STAT TROPOININ, ED - Abnormal; Notable for the following:    Troponin i, poc 0.16 (*)    All other components within normal limits  POC OCCULT BLOOD, ED  I-STAT TROPOININ, ED  TYPE AND SCREEN  PREPARE RBC (CROSSMATCH)    Imaging Review Ct Abdomen Pelvis Wo Contrast  11/17/2015  CLINICAL DATA:  Low abdominal pain and hematuria beginning 1 day ago. History of a kidney transplant. EXAM: CT ABDOMEN AND PELVIS WITHOUT CONTRAST TECHNIQUE: Multidetector CT imaging of the abdomen and pelvis was performed following the standard protocol without IV contrast. COMPARISON:  None. FINDINGS: Lung bases: Heart normal in size. There are dense coronary artery calcifications. Trace right pleural fluid. Lung base subsegmental atelectasis. Ill-defined nodular opacity the left lower lobe measures 6 mm. Smaller partly imaged nodular opacity lies right upper lobe of the liver fissure. No evidence of pneumonia or pulmonary edema. Liver, spleen, gallbladder, pancreas, adrenal glands:  Unremarkable. Kidneys, ureters, bladder: Marked atrophy of the native kidneys with multiple bilateral cysts. No hydronephrosis. Normal native ureters. Right pelvic  renal transplant shows perinephric stranding. The collecting system is not well-defined but appears least mildly dilated. The transplant ureter is dilated with periureteral stranding. A 3.8 mm stone projects at the renal transplant ureterovesicular junction. Bladder is mostly decompressed but otherwise unremarkable. Vascular: There are dense atherosclerotic calcifications along the abdominal aorta and its branch vessels. No aneurysm. Lymph nodes:  No pathologically enlarged lymph nodes. Ascites:  None. Gastrointestinal/ abdominal wall: Large right mid to lower quadrant hernia which contains small bowel without evidence of obstruction, strangulation or incarceration. There is a small fat containing umbilical hernia. There are scattered colonic diverticula. No diverticulitis. Appendix not visualized. Musculoskeletal:  No osteoblastic or osteolytic lesions. IMPRESSION: 1. 3.8 mm stone lies at the ureterovesicular junction of the transplant kidney causing at least mild transplant kidney hydronephrosis and moderate hydroureter as well as periureteral and perinephric stranding. 2. No other acute findings. Electronically Signed   By: Lajean Manes M.D.   On: 11/17/2015 11:50   Dg Chest 2 View  11/17/2015  CLINICAL DATA:  Mid chest pain began last night with pain radiating into left arm with numbness/tingling left hand and fingers. SOB x 1 month. Hx of HTN- on meds; diabetic. EXAM: CHEST  2 VIEW COMPARISON:  10/30/2015 FINDINGS: Status post median sternotomy. Surgical clips are noted in the superior mediastinum. Right stent graft again noted. The heart is enlarged. Bilateral pleural effusions are present. No focal consolidations or pulmonary edema. IMPRESSION: 1. Cardiomegaly. 2. Small bilateral pleural effusions. Electronically Signed   By: Nolon Nations M.D.   On: 11/17/2015 08:45   I have personally reviewed and evaluated these images and lab results as part of my medical decision-making.   EKG  Interpretation   Date/Time:  Saturday November 17 2015 07:29:00 EDT Ventricular Rate:  88 PR Interval:  115 QRS Duration: 98 QT Interval:  427 QTC Calculation: 517 R Axis:   60 Text Interpretation:  Sinus rhythm Borderline short PR interval Abnrm T,  consider ischemia, anterolateral lds Prolonged QT interval Baseline wander  in lead(s) II III aVR aVL aVF Lateral ischemia seen on prior EKGs. TWI in  V2 seems more prominent.  Confirmed by Oleta Mouse MD, Hinton Dyer [phone]) on 11/17/2015  7:34:11 AM Also confirmed by LIU MD, [place] [phone]), editor Rolla Plate, Joelene Millin  [phone])  on  11/17/2015 11:34:56 AM      MDM   Final diagnoses:  Chest pain, unspecified chest pain type  Anemia, unspecified anemia type   60 year old male presenting with chest pain and shortness of breath. Chest pain has since resolved. Afebrile and hemodynamically stable. Basilar rales and scant wheezing heard. DuoNeb given which improved breath sounds. Regular rate and rhythm. No bilateral peripheral edema noted. Hemoglobin is 6.4 which is down from 8.3 2 weeks ago. Creatinine and BUN at baseline. BNP 2600 which is unchanged from 2 weeks ago. Troponin 0.16 with some T-wave inversions lateral leads that seem more prominent when compared to EKG 2 weeks ago. Urine is obvious frank blood. CT of the abdomen shows nephrolithiasis in transplant kidney. 2 units packed red blood cells ordered. Consulted hospitalist, Paula-NP, for admission for chest pain and anemia. Consulted nephrology as patient will likely need dialysis during admission.    Lahoma Crocker Anastasios Melander, PA-C 11/17/15 [place] Liu, MD 11/17/15 [phone]

## 2015-11-17 NOTE — ED Notes (Signed)
Delay in blood admin-waiting for CT abd.

## 2015-11-17 NOTE — ED Notes (Signed)
Admitting at bedside 

## 2015-11-17 NOTE — ED Notes (Signed)
Attempted report 

## 2015-11-17 NOTE — ED Notes (Signed)
EDP at bedside  

## 2015-11-17 NOTE — ED Notes (Signed)
Carb modified lunch tray ordered @ 1237pm. Spoke w/ Lawerance Bach

## 2015-11-17 NOTE — ED Notes (Signed)
EDP notified of low HgB.

## 2015-11-17 NOTE — ED Notes (Signed)
Unable to collect Istat trop until 2hours after blood admin per policy and arm restriction.

## 2015-11-17 NOTE — ED Notes (Signed)
Pt. Given cup for urine specimen at this time and asked to void.

## 2015-11-18 DIAGNOSIS — I10 Essential (primary) hypertension: Secondary | ICD-10-CM | POA: Diagnosis not present

## 2015-11-18 DIAGNOSIS — R079 Chest pain, unspecified: Secondary | ICD-10-CM | POA: Diagnosis not present

## 2015-11-18 DIAGNOSIS — N186 End stage renal disease: Secondary | ICD-10-CM | POA: Diagnosis not present

## 2015-11-18 DIAGNOSIS — Z992 Dependence on renal dialysis: Secondary | ICD-10-CM | POA: Diagnosis not present

## 2015-11-18 DIAGNOSIS — I209 Angina pectoris, unspecified: Secondary | ICD-10-CM | POA: Diagnosis not present

## 2015-11-18 DIAGNOSIS — R05 Cough: Secondary | ICD-10-CM | POA: Diagnosis not present

## 2015-11-18 DIAGNOSIS — I120 Hypertensive chronic kidney disease with stage 5 chronic kidney disease or end stage renal disease: Secondary | ICD-10-CM | POA: Diagnosis not present

## 2015-11-18 DIAGNOSIS — E1122 Type 2 diabetes mellitus with diabetic chronic kidney disease: Secondary | ICD-10-CM

## 2015-11-18 DIAGNOSIS — D631 Anemia in chronic kidney disease: Secondary | ICD-10-CM | POA: Diagnosis not present

## 2015-11-18 DIAGNOSIS — N2581 Secondary hyperparathyroidism of renal origin: Secondary | ICD-10-CM | POA: Diagnosis not present

## 2015-11-18 DIAGNOSIS — I2510 Atherosclerotic heart disease of native coronary artery without angina pectoris: Secondary | ICD-10-CM | POA: Diagnosis not present

## 2015-11-18 DIAGNOSIS — T8619 Other complication of kidney transplant: Secondary | ICD-10-CM | POA: Diagnosis not present

## 2015-11-18 DIAGNOSIS — E1129 Type 2 diabetes mellitus with other diabetic kidney complication: Secondary | ICD-10-CM | POA: Diagnosis not present

## 2015-11-18 LAB — CBC
HEMATOCRIT: 28.5 % — AB (ref 39.0–52.0)
HEMOGLOBIN: 8.7 g/dL — AB (ref 13.0–17.0)
MCH: 27.2 pg (ref 26.0–34.0)
MCHC: 30.5 g/dL (ref 30.0–36.0)
MCV: 89.1 fL (ref 78.0–100.0)
Platelets: 194 10*3/uL (ref 150–400)
RBC: 3.2 MIL/uL — ABNORMAL LOW (ref 4.22–5.81)
RDW: 17.4 % — ABNORMAL HIGH (ref 11.5–15.5)
WBC: 6.2 10*3/uL (ref 4.0–10.5)

## 2015-11-18 LAB — GLUCOSE, CAPILLARY
GLUCOSE-CAPILLARY: 97 mg/dL (ref 65–99)
GLUCOSE-CAPILLARY: 101 mg/dL — AB (ref 65–99)
Glucose-Capillary: 88 mg/dL (ref 65–99)
Glucose-Capillary: 77 mg/dL (ref 65–99)

## 2015-11-18 LAB — BASIC METABOLIC PANEL
ANION GAP: 14 (ref 5–15)
BUN: 14 mg/dL (ref 6–20)
CALCIUM: 7.3 mg/dL — AB (ref 8.9–10.3)
CHLORIDE: 100 mmol/L — AB (ref 101–111)
CO2: 23 mmol/L (ref 22–32)
Creatinine, Ser: 7.31 mg/dL — ABNORMAL HIGH (ref 0.61–1.24)
GFR calc Af Amer: 8 mL/min — ABNORMAL LOW (ref >60)
GFR calc non Af Amer: 7 mL/min — ABNORMAL LOW (ref >60)
GLUCOSE: 97 mg/dL (ref 65–99)
POTASSIUM: 4.3 mmol/L (ref 3.5–5.1)
Sodium: 137 mmol/L (ref 135–145)

## 2015-11-18 LAB — RENAL FUNCTION PANEL
ANION GAP: 13 (ref 5–15)
Albumin: 2.7 g/dL — ABNORMAL LOW (ref 3.5–5.0)
BUN: 14 mg/dL (ref 6–20)
CO2: 24 mmol/L (ref 22–32)
Calcium: 7.3 mg/dL — ABNORMAL LOW (ref 8.9–10.3)
Chloride: 99 mmol/L — ABNORMAL LOW (ref 101–111)
Creatinine, Ser: 7.25 mg/dL — ABNORMAL HIGH (ref 0.61–1.24)
GFR calc Af Amer: 8 mL/min — ABNORMAL LOW (ref >60)
GFR calc non Af Amer: 7 mL/min — ABNORMAL LOW (ref >60)
GLUCOSE: 98 mg/dL (ref 65–99)
POTASSIUM: 4 mmol/L (ref 3.5–5.1)
Phosphorus: 2.7 mg/dL (ref 2.5–4.6)
Sodium: 136 mmol/L (ref 135–145)

## 2015-11-18 MED ORDER — ISOSORBIDE MONONITRATE ER 60 MG PO TB24
60.0000 mg | ORAL_TABLET | Freq: Every day | ORAL | Status: DC
  Administered 2015-11-18 – 2015-11-23 (×6): 60 mg via ORAL
  Filled 2015-11-18 (×6): qty 1

## 2015-11-18 NOTE — Consult Note (Signed)
CARDIOLOGY CONSULT NOTE  Patient ID: Patrick [name] MRN: [id number] DOB/AGE: 1955-10-24 60 y.o.  Admit date: 11/17/2015 Referring Physician  Bonnielee Haff, MD Primary Physician:  Elyn Peers, MD Reason for Consultation  Chest pain  HPI: Patrick [name]  is a 60 y.o. 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  Patient describes this as heaviness to tightness in the middle of the chest with questionable left  Arm radiation especially when he laid down in bed. Chest pain got better when he sat up. He also experienced dyspnea with exertion activity recently and also noticed orthopnea on the day of chest pain. He felt well as soon as he sat up with no recurrence. He went to the dialysis center and mentioned that he had chest pain the previous night, patient was transferred to the hospital for further evaluation. He states that he has not had any further episodes of chest pain since being in the hospital. He underwent dialysis without any complications or chest pain. Presently he states that he is doing well and denies any specific complaints.  Patient on questioning also complains of pain and cramping in his legs left worse than the right with activity. He is also noticed significant weakness in both lower extremities with activity. No ulceration or bruise discoloration. He has prior history of tobacco use disorder quit many years ago.  He was also found to be severely anemic for which he received blood transfusion. Denies any obvious GI bleed, dark stools or bloody stools. He does not recollect if he has ever had a GI workup.  Past Medical History  Diagnosis Date  . Hypertension   . Diabetes mellitus without complication  ([place])   . End-stage renal failure with renal transplant ([place])   . Gout   . Impotence of organic origin   . Type I (juvenile type) diabetes mellitus with renal manifestations, not stated as uncontrolled   . Secondary hyperparathyroidism [facility])      Past Surgical History  Procedure Laterality Date  . Birth defect heart surgery  1996  . Kidney transplant  2009  . Appendectomy    . Av fistula placement Right 09/18/2014    Procedure: INSERTION OF ARTERIOVENOUS (AV) GORE-TEX GRAFT ARM USING 4-7MM  X 45CM STRETCH GORE-TEX VASCULAR GRAFT;  Surgeon: Rosetta Posner, MD;  Location: [facility] OR;  Service: Vascular;  Laterality: Right;     Family History  Problem Relation Age of Onset  . Hyperlipidemia Mother   . Hypertension Mother      Social History: Social History   Social History  . Marital Status: Married    Spouse Name: N/A  . Number of Children: N/A  . Years of Education: N/A   Occupational History  . Not on file.   Social History Main Topics  . Smoking status: Former Research scientist (life sciences)  . Smokeless tobacco: Not on file  . Alcohol Use: No  . Drug Use: No  . Sexual Activity: Not Currently    Birth Control/ Protection: None   Other Topics Concern  . Not on file   Social History Narrative     Prescriptions prior to admission  Medication Sig Dispense Refill Last Dose  . aspirin 81 MG tablet Take 81 mg by  mouth daily.   11/16/2015 at Unknown time  . calcium carbonate (OS-CAL - DOSED IN MG OF ELEMENTAL CALCIUM) 1250 (500 Ca) MG tablet Take 1 tablet by mouth daily with breakfast.   11/16/2015 at Unknown time  . carvedilol (COREG) 25 MG tablet Take 25 mg by mouth 2 (two) times daily with a meal.   11/16/2015 at 0800  . colchicine 0.6 MG tablet Take 1 tablet (0.6 mg total) by mouth daily. 30 tablet 0 11/16/2015 at Unknown time  . repaglinide (PRANDIN) 0.5 MG tablet Take 1 tablet (0.5 mg total) by mouth 2 (two) times daily before a meal. 60 tablet 11 11/16/2015 at Unknown time  . calcitRIOL (ROCALTROL)  0.5 MCG capsule Take 3 capsules (1.5 mcg total) by mouth daily. (Patient not taking: Reported on 10/30/2015) 90 capsule 0 Not Taking at Unknown time   ROS: General: no fevers/chills/night sweats Eyes: no blurry vision, diplopia, or amaurosis ENT: no sore throat or hearing loss Resp: dyspnea on exertion present no hemoptysis CV: chest pain present without recurrence. No dizziness, palpitations. GI: no abdominal pain, nausea, vomiting, diarrhea, or constipation GU: no dysuria, frequency, or hematuria Skin: no rash Neuro: no headache, numbness, tingling, or weakness of extremities Musculoskeletal: weakness in both the lower extremity is with activity, cramping in the legs with activity. Heme: no bleeding, DVT, or easy bruising Endo: no polydipsia or polyuria    Physical Exam: Blood pressure 154/95, pulse 110, temperature 98.6 F (37 C), temperature source Oral, resp. rate 22, height 5\' 6"  (1.676 m), weight 97.977 kg (216 lb), SpO2 100 %.   General appearance: alert, cooperative, appears older than stated age and no distress Lungs: clear to auscultation bilaterally Heart: regular rate and rhythm, S1, S2 normal, no murmur, click, rub or gallop Abdomen: soft, non-tender; bowel sounds normal; no masses,  no organomegaly and llarge incisional hernia at the previously transplanted kidney site present. Extremities: extremities normal, atraumatic, no cyanosis or edema and Skin appears to have chronic ischemia with loss of hair and shiny skin. Pulses: carotid arteries normal, bilateral femoral arteries one to 2+ without bruit.  Popliteal pulses absent pedal pulses absent bilaterally.  Right brachial AV fistula present, functioning. Previous right forearm and left forearm fistula present, nonfunctioning. Neurologic: Grossly normal  Labs:   Lab Results  Component Value Date   WBC 6.2 11/18/2015   HGB 8.7* 11/18/2015   HCT 28.5* 11/18/2015   MCV 89.1 11/18/2015   PLT 194 11/18/2015    Recent  Labs Lab 11/18/15 0617  NA 137  136  K 4.3  4.0  CL 100*  99*  CO2 23  24  BUN 14  14  CREATININE 7.31*  7.25*  CALCIUM 7.3*  7.3*  GLUCOSE 97  98    Recent Labs  10/30/15 0855 11/17/15 0759  BNP 2633.8* 2608.9*    HEMOGLOBIN A1C Lab Results  Component Value Date   HGBA1C 5.4 10/11/2015   MPG 108 10/11/2015    Cardiac Panel (last 3 results)  Recent Labs  11/17/15 1526 11/17/15 1817  TROPONINI 0.12* 0.12*    Lab Results  Component Value Date   TROPONINI 0.12* 11/17/2015     EKG 11/18/2015: Normal sinus rhythm/sinus tachycardia at the rate of 112 bpm, LVH with repolarization abnormality, cannot exclude a lateral ischemia.  Nonspecific T abnormality.  PVC.  Compared to 10/30/2015, sinus tachycardia new.  Echocardiogram. 11/29/2013 Left ventricle: The cavity size was normal. Wall thickness was increased in a pattern of mild LVH. Systolic function  was normal. The estimated ejection fraction was in the range of 50% to 55%. Wall motion was normal; there were no regional wall motion abnormalities. Doppler parameters are consistent with abnormal left ventricular relaxation (grade 1 diastolic dysfunction). Mitral valve: Calcified annulus. Mildly thickened leafletsLeft atrium: The atrium was mildly dilated.  Lexiscan myoview stress test 01/08/2015: 1. The resting electrocardiogram demonstrated normal sinus rhythm and no resting arrhythmias. Non specific T inversion high lateral leads. Stress EKG is non-diagnostic for ischemia as it a pharmacologic stress using Lexiscan. Stress symptoms included dyspnea. 2. The perfusion imaging study demonstrates a very small-sized moderate ischemia in the basal inferior, inferoapical and apical septal region. Left ventricle systolic function was preserved with an ejection fraction of 55%. This is an abnormal stress test, however low risk due to size of the defect.   Radiology: CT scan of the abdomen and pelvis without contrast  11/17/2015:  IMPRESSION:  1. 3.8 mm stone lies at the ureterovesicular junction of the transplant kidney causing at least mild transplant kidney hydronephrosis and moderate hydroureter as well as periureteral and perinephric stranding. 2. No other acute findings.  3.  Dense coronary calcification.   Electronically Signed   By: Lajean Manes M.D.   On: 11/17/2015 11:50   Dg Chest 2 View  11/17/2015  CLINICAL DATA:  Mid chest pain began last night with pain radiating into left arm with numbness/tingling left hand and fingers. SOB x 1 month. Hx of HTN- on meds; diabetic. EXAM: CHEST  2 VIEW COMPARISON:  10/30/2015 FINDINGS: Status post median sternotomy. Surgical clips are noted in the superior mediastinum. Right stent graft again noted. The heart is enlarged. Bilateral pleural effusions are present. No focal consolidations or pulmonary edema. IMPRESSION: 1. Cardiomegaly. 2. Small bilateral pleural effusions. Electronically Signed   By: Nolon Nations M.D.   On: 11/17/2015 08:45    Scheduled Meds: . aspirin EC  81 mg Oral Daily  . calcium carbonate  1 tablet Oral Q breakfast  . carvedilol  25 mg Oral BID WC  . colchicine  0.6 mg Oral Daily  . insulin aspart  0-9 Units Subcutaneous TID WC  . sodium chloride flush  3 mL Intravenous Q12H   Continuous Infusions:  PRN Meds:.acetaminophen **OR** acetaminophen, bisacodyl, gi cocktail, HYDROcodone-acetaminophen, ipratropium-albuterol, ondansetron **OR** ondansetron (ZOFRAN) IV, polyethylene glycol  ASSESSMENT AND PLAN:  1. Atypical Chest pain with h/o abnormal stress test in June 2016 (see above). Severe coronary calcification by CT scan. 2. Anomalous origin of left coronary artery.  S/P LIMA to LAD: Due to anomalous origin of the left coronary artery from the right coronary cusp in May 1997. 3. History of NSVT in 2015 admission, no recurrence on telemetry this admission. 4. H/O Renal transplant and rejection, presently back on HD. 5. IDDM (type I)  controlled 6. PAD with symptoms of claudication in bilateral lower extremity is. 7. Hypertension. 8. Severe anemia of chronic disease, specifically renal failure  Recommendation: I would add isosorbide mononitrate to his present medical regimen, would not recommend stress testing as he already has had a stress test which was positive for myocardial ischemia. CT scan also shows severe coronary calcification, hence I suspect he probably has significant CAD. However due to severe anemia, multiple medical comorbidities, would recommend continued medical therapy for now until etiology for anemia is determined to be due to chronic disease and not due to any GI bleed. This is being done to exclude complications following angioplasty or use of antiplatelet therapy.  I do  not suspect acute coronary syndrome, non-ST elevation myocardial infarction, patient severely anemic with demand ischemia. Otherwise stable from cardiac standpoint. I will also increase his beta blocker to 25 g of Coreg twice a day. Continue aspirin 81 mg by mouth daily. I will check his lipid profile, patient not on a statin,  Will certainly benefit from being on a statin.    Adrian Prows, MD 11/18/2015, 8:47 AM [facility] [place] Pager: [phone] Office: [phone] If no answer Cell [phone]

## 2015-11-18 NOTE — Progress Notes (Signed)
[facility] Progress Note   Subjective: no complaints  Filed Vitals:   11/17/15 2156 11/18/15 0413 11/18/15 1300 11/18/15 1520  BP: 149/93 154/95 136/78 117/65  Pulse: 98 110 100 86  Temp: 98.7 F (37.1 C) 98.6 F (37 C)    TempSrc: Oral Oral Oral   Resp: 20 22 20    Height:      Weight:  97.977 kg (216 lb)    SpO2: 96% 100% 93%     Inpatient medications: . aspirin EC  81 mg Oral Daily  . calcium carbonate  1 tablet Oral Q breakfast  . carvedilol  25 mg Oral BID WC  . colchicine  0.6 mg Oral Daily  . insulin aspart  0-9 Units Subcutaneous TID WC  . isosorbide mononitrate  60 mg Oral Daily  . sodium chloride flush  3 mL Intravenous Q12H     acetaminophen **OR** acetaminophen, bisacodyl, gi cocktail, HYDROcodone-acetaminophen, ipratropium-albuterol, ondansetron **OR** ondansetron (ZOFRAN) IV, polyethylene glycol  Exam: General: Alert Obese AAM, NA D, Appropriate/OX3 Heart: RRR, no rub, mur, gallop Lungs: CTA  Abdomen: obese, Soft , NT, ND, Rlower quad Kid transplant area nontender Extremities: no pedal edema Skin: no overt rash Neuro: alert [id number]#[id number] acute focal deficifts Dialysis Access: pos bruit RUA AVF  Dialysis: [place] on TTS . EDW 98.5 kg HD Bath 2k, 2,5 ca Time 4hrs Heparin 1600. Access RUA AVG  Hec 2 mcg IV/HD, mircera 160mcg q 2wks last on 11/13/15 Units IV/HD  Other op labs hgb as above   CXR no edema, small effusions CT abd - transplant w mild hydro/ hydroureter, 3.8 mm stone at UVJ of tx kidney   Assessment: 1. 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 Anemia, severe - multiple etiologies-ESRD/ Hematuria with kid stone= wu per admit 3. Chest pain - wu per prim, hx CABG. Hx past +stress test. No intervention planned per cards until anemia worked up/ treated, r/o GIB.  4. Hypertension/volume - bp stable in er 134/ fu wts, at dry wt 5. 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 Metabolic bone disease with  Mild Hypocalcemia as op -ca corrected to 8.1 Noted not takeing op PO calcitrol q day Use 2.5 Ca bath / / tells me uses Velphora as binder per his report/ fu ca/phos Use Oscal in hosp.  7. DM type 2- per admit 8. Hematuria/ transplant kidney hydro/ UVJ stone renal tx ureter - will likely need referral to [facility] where his transplant was done for this problem. Would not recommend transplant nephrostomy tube placement. Stone retrieval at a transplant center would be ideal Rx i think. Will d/w primary.   Plan - HD tuesday   Kelly Splinter MD [facility] pager [phone]    cell [phone] 11/18/2015, 6:21 PM    Recent Labs Lab 11/17/15 0759 11/17/15 1817 11/18/15 0617  NA 136 136 137  136  K 3.2* 3.4* 4.3  4.0  CL 97* 98* 100*  99*  CO2 24 24 23  24   GLUCOSE 93 94 97  98  BUN 22* 26* 14  14  CREATININE 10.95* 11.30* 7.31*  7.25*  CALCIUM 7.1* 7.0* 7.3*  7.3*  PHOS  --   --  2.7    Recent Labs Lab 11/18/15 0617  ALBUMIN 2.7*    Recent Labs Lab 11/17/15 0759 11/17/15 1817 11/18/15 0617  WBC 5.0 5.9 6.2  HGB 6.4* 7.4* 8.7*  HCT 20.2* 23.8* 28.5*  MCV  89.8 87.8 89.1  PLT 169 189 194

## 2015-11-18 NOTE — Progress Notes (Addendum)
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 He had also been experiencing hematuria for the last 1 month. He was hospitalized for further management.  Past medical history:  Past Medical History  Diagnosis Date  . Hypertension   . Diabetes mellitus without complication ([place])   . End-stage renal failure with renal transplant ([place])   . Gout   . Impotence of organic origin   . Type I (juvenile type) diabetes mellitus with renal manifestations, not stated as uncontrolled   . Secondary hyperparathyroidism [facility])     Consultants: Cardiology. Nephrology. Interventional radiology  Procedures: None as yet.  Antibiotics: None  Subjective: Patient denies any further chest pain this morning. Still feels short of breath. Did feel some improvement after dialysis yesterday, but not significantly so. Continues to pass blood in the urine.  Objective:  Vital Signs  Filed Vitals:   11/17/15 2100 11/17/15 2120 11/17/15 2156 11/18/15 0413  BP: 114/76 130/80 149/93 154/95  Pulse: 89 89 98 110  Temp:  99.7 F (37.6 C) 98.7 F (37.1 C) 98.6 F (37 C)  TempSrc:  Oral Oral Oral  Resp: 23 22 20 22   Height:      Weight:  97 kg (213 lb 13.5 oz)  97.977 kg (216 lb)  SpO2:  100% 96% 100%    Intake/Output Summary (Last 24 hours) at 11/18/15 1049 Last data filed at 11/18/15 0600  Gross per 24 hour  Intake   1014 ml  Output   3101 ml  Net  -2087 ml   Filed Weights   11/17/15 1740 11/17/15 2120 11/18/15 0413  Weight: 99.3 kg (218 lb 14.7 oz) 97 kg (213 lb 13.5 oz) 97.977 kg (216 lb)    General  appearance: alert, cooperative, appears stated age, no distress and morbidly obese Resp: Few crackles bilateral bases. No wheezing or rhonchi. Cardio: regular rate and rhythm, S1, S2 normal, no murmur, click, rub or gallop GI: soft, non-tender; bowel sounds normal; no masses,  no organomegaly Extremities: extremities normal, atraumatic, no cyanosis or edema Neurologic: Awake and alert. Oriented 3. No focal neurological deficits.  Lab Results:  Data Reviewed: I have personally reviewed following labs and imaging studies  CBC:  Recent Labs Lab 11/17/15 0759 11/17/15 1817 11/18/15 0617  WBC 5.0 5.9 6.2  HGB 6.4* 7.4* 8.7*  HCT 20.2* 23.8* 28.5*  MCV 89.8 87.8 89.1  PLT 169 189 [id number]   Basic Metabolic Panel:  Recent Labs Lab 11/17/15 0759 11/17/15 1817 11/18/15 0617  NA 136 136 137  136  K 3.2* 3.4* 4.3  4.0  CL 97* 98* 100*  99*  CO2 24 24 23  24   GLUCOSE 93 94 97  98  BUN 22* 26* 14  14  CREATININE 10.95* 11.30* 7.31*  7.25*  CALCIUM 7.1* 7.0* 7.3*  7.3*  PHOS  --   --  2.7   GFR: Estimated Creatinine Clearance: 11.6 mL/min (by C-G formula based on Cr of 7.31). Liver Function Tests:  Recent Labs Lab 11/18/15 0617  ALBUMIN 2.7*   Coagulation Profile:  Recent Labs Lab 11/17/15 1502  INR 1.19   Cardiac Enzymes:  Recent Labs  Lab 11/17/15 1526 11/17/15 1817  TROPONINI 0.12* 0.12*   CBG:  Recent Labs Lab 11/17/15 1701 11/17/15 2205 11/18/15 0755  GLUCAP 90 89 88   Urine analysis:    Component Value Date/Time   COLORURINE RED* 11/17/2015 0910   APPEARANCEUR TURBID* 11/17/2015 0910   LABSPEC 1.035* 11/17/2015 0910   PHURINE 6.0 11/17/2015 0910   GLUCOSEU 100* 11/17/2015 0910   HGBUR LARGE* 11/17/2015 0910   BILIRUBINUR LARGE* 11/17/2015 0910   KETONESUR 40* 11/17/2015 0910   PROTEINUR >300* 11/17/2015 0910   UROBILINOGEN 0.2 11/24/2013 0242   NITRITE POSITIVE* 11/17/2015 0910   LEUKOCYTESUR LARGE* 11/17/2015 0910    Radiology  Studies: Ct Abdomen Pelvis Wo Contrast  11/17/2015  CLINICAL DATA:  Low abdominal pain and hematuria beginning 1 day ago. History of a kidney transplant. EXAM: CT ABDOMEN AND PELVIS WITHOUT CONTRAST TECHNIQUE: Multidetector CT imaging of the abdomen and pelvis was performed following the standard protocol without IV contrast. COMPARISON:  None. FINDINGS: Lung bases: Heart normal in size. There are dense coronary artery calcifications. Trace right pleural fluid. Lung base subsegmental atelectasis. Ill-defined nodular opacity the left lower lobe measures 6 mm. Smaller partly imaged nodular opacity lies right upper lobe of the liver fissure. No evidence of pneumonia or pulmonary edema. Liver, spleen, gallbladder, pancreas, adrenal glands:  Unremarkable. Kidneys, ureters, bladder: Marked atrophy of the native kidneys with multiple bilateral cysts. No hydronephrosis. Normal native ureters. Right pelvic renal transplant shows perinephric stranding. The collecting system is not well-defined but appears least mildly dilated. The transplant ureter is dilated with periureteral stranding. A 3.8 mm stone projects at the renal transplant ureterovesicular junction. Bladder is mostly decompressed but otherwise unremarkable. Vascular: There are dense atherosclerotic calcifications along the abdominal aorta and its branch vessels. No aneurysm. Lymph nodes:  No pathologically enlarged lymph nodes. Ascites:  None. Gastrointestinal/ abdominal wall: Large right mid to lower quadrant hernia which contains small bowel without evidence of obstruction, strangulation or incarceration. There is a small fat containing umbilical hernia. There are scattered colonic diverticula. No diverticulitis. Appendix not visualized. Musculoskeletal:  No osteoblastic or osteolytic lesions. IMPRESSION: 1. 3.8 mm stone lies at the ureterovesicular junction of the transplant kidney causing at least mild transplant kidney hydronephrosis and moderate  hydroureter as well as periureteral and perinephric stranding. 2. No other acute findings. Electronically Signed   By: Lajean Manes M.D.   On: 11/17/2015 11:50   Dg Chest 2 View  11/17/2015  CLINICAL DATA:  Mid chest pain began last night with pain radiating into left arm with numbness/tingling left hand and fingers. SOB x 1 month. Hx of HTN- on meds; diabetic. EXAM: CHEST  2 VIEW COMPARISON:  10/30/2015 FINDINGS: Status post median sternotomy. Surgical clips are noted in the superior mediastinum. Right stent graft again noted. The heart is enlarged. Bilateral pleural effusions are present. No focal consolidations or pulmonary edema. IMPRESSION: 1. Cardiomegaly. 2. Small bilateral pleural effusions. Electronically Signed   By: Nolon Nations M.D.   On: 11/17/2015 08:45     Medications:  Scheduled: . aspirin EC  81 mg Oral Daily  . calcium carbonate  1 tablet Oral Q breakfast  . carvedilol  25 mg Oral BID WC  . colchicine  0.6 mg Oral Daily  . insulin aspart  0-9 Units Subcutaneous TID WC  . sodium chloride flush  3 mL Intravenous Q12H   Continuous:  [id number] **OR** acetaminophen, bisacodyl, gi cocktail, HYDROcodone-acetaminophen, ipratropium-albuterol, ondansetron **OR** ondansetron (ZOFRAN) IV, polyethylene glycol  Assessment/Plan:  Active Problems:   Essential hypertension   Diabetes mellitus type 2, controlled ([place])   Kidney transplant failure   Dyspnea   Chest pain   Chest pain at rest   Normocytic anemia   Gynecomastia   Renal stone   Cough    Non-exertional chest pain/dyspnea/previous history of single-vessel bypass about 20 years ago Patient denies any further chest pain. Continues to be dyspneic. Some of his symptoms most likely due to fluid overload. He does have minimally elevated troponin. Discuss with his cardiologist, Dr. Einar Gip. Apparently patient has not been following up with him. Has missed appointments. However, Dr. Einar Gip agrees to see the patient in  the hospital. We await his input.   Cough Productive of clear phlegm. Recently treated for HCAP. A new infiltrates identified on chest x-ray. He is afebrile. Cough could be due to his fluid overload.   ESRD Failed transplant. Dialyzes Tu / Thur/ Sat. Nephrology is following. Patient was dialyzed 4/22.   Normocytic anemia/acute blood loss anemia He has chronic anemia with wide variations in hgb from mid 8 to mid 11 range. Recieved a unit of blood 2 weeks ago for hgb of 7.9. Despite transfusion his hgb has further declined to 6.4. Suspect progressive anemia secondary to ongoing gross hematuria. He received 2 units of blood. Hemoglobin has responded appropriately. Monitor CBCs closely.  Gross hematuria/renal stone CTscan reveals 8 mm stone at the ureterovesicular junction of the transplant kidney with mild transplant kidney hydronephrosis and moderate hydroureter as well as periureteral and perinephric stranding. Admitting physician discussed with urology. They recommended consulting interventional radiology for nephrostomy. Apparently her transplanted kidney cannot be stented. IR input is pending. Patient continues to have hematuria. He does not have any abdominal pain. INR is normal.  Essential hypertension Continue home medications. Monitor blood pressures closely.  Diabetes mellitus type 2, controlled Now off insulin and on Prandin. A1c 5.4 2 weeks ago. CBGs to be checked. Sliding scale insulin coverage.  Gynecomastia Etiology is unclear. Breast tissue growth L > R over last couple of months. Patient is supposed to see Endocrinology soon. Complains of intermittent discomfort in left breast. Denies recent use of aldactone.  Large RLQ ventral hernia Chronic.  DVT Prophylaxis: SCDs    Code Status: Full code  Family Communication: Discussed with the patient  Disposition Plan: Await specialty input.    LOS: 1 day   [facility] Pager [phone] 11/18/2015, 10:49  AM  If 7PM-7AM, please contact night-coverage at [email or web address], password [email or web address]

## 2015-11-19 DIAGNOSIS — I209 Angina pectoris, unspecified: Secondary | ICD-10-CM | POA: Diagnosis not present

## 2015-11-19 DIAGNOSIS — M109 Gout, unspecified: Secondary | ICD-10-CM | POA: Diagnosis present

## 2015-11-19 DIAGNOSIS — I120 Hypertensive chronic kidney disease with stage 5 chronic kidney disease or end stage renal disease: Secondary | ICD-10-CM | POA: Diagnosis present

## 2015-11-19 DIAGNOSIS — R079 Chest pain, unspecified: Secondary | ICD-10-CM | POA: Diagnosis not present

## 2015-11-19 DIAGNOSIS — T8619 Other complication of kidney transplant: Secondary | ICD-10-CM | POA: Diagnosis present

## 2015-11-19 DIAGNOSIS — Z87891 Personal history of nicotine dependence: Secondary | ICD-10-CM | POA: Diagnosis not present

## 2015-11-19 DIAGNOSIS — Z951 Presence of aortocoronary bypass graft: Secondary | ICD-10-CM | POA: Diagnosis not present

## 2015-11-19 DIAGNOSIS — I10 Essential (primary) hypertension: Secondary | ICD-10-CM | POA: Diagnosis not present

## 2015-11-19 DIAGNOSIS — R319 Hematuria, unspecified: Secondary | ICD-10-CM | POA: Diagnosis not present

## 2015-11-19 DIAGNOSIS — D638 Anemia in other chronic diseases classified elsewhere: Secondary | ICD-10-CM | POA: Diagnosis present

## 2015-11-19 DIAGNOSIS — N200 Calculus of kidney: Secondary | ICD-10-CM | POA: Diagnosis not present

## 2015-11-19 DIAGNOSIS — Y830 Surgical operation with transplant of whole organ as the cause of abnormal reaction of the patient, or of later complication, without mention of misadventure at the time of the procedure: Secondary | ICD-10-CM | POA: Diagnosis not present

## 2015-11-19 DIAGNOSIS — I25118 Atherosclerotic heart disease of native coronary artery with other forms of angina pectoris: Secondary | ICD-10-CM | POA: Diagnosis present

## 2015-11-19 DIAGNOSIS — N132 Hydronephrosis with renal and ureteral calculous obstruction: Secondary | ICD-10-CM | POA: Diagnosis present

## 2015-11-19 DIAGNOSIS — N029 Recurrent and persistent hematuria with unspecified morphologic changes: Secondary | ICD-10-CM | POA: Diagnosis present

## 2015-11-19 DIAGNOSIS — R0602 Shortness of breath: Secondary | ICD-10-CM | POA: Diagnosis not present

## 2015-11-19 DIAGNOSIS — E781 Pure hyperglyceridemia: Secondary | ICD-10-CM | POA: Diagnosis present

## 2015-11-19 DIAGNOSIS — E1129 Type 2 diabetes mellitus with other diabetic kidney complication: Secondary | ICD-10-CM | POA: Diagnosis not present

## 2015-11-19 DIAGNOSIS — R310 Gross hematuria: Secondary | ICD-10-CM | POA: Diagnosis not present

## 2015-11-19 DIAGNOSIS — R05 Cough: Secondary | ICD-10-CM | POA: Diagnosis not present

## 2015-11-19 DIAGNOSIS — I739 Peripheral vascular disease, unspecified: Secondary | ICD-10-CM | POA: Diagnosis present

## 2015-11-19 DIAGNOSIS — E1122 Type 2 diabetes mellitus with diabetic chronic kidney disease: Secondary | ICD-10-CM | POA: Diagnosis present

## 2015-11-19 DIAGNOSIS — N62 Hypertrophy of breast: Secondary | ICD-10-CM | POA: Diagnosis present

## 2015-11-19 DIAGNOSIS — Z992 Dependence on renal dialysis: Secondary | ICD-10-CM | POA: Diagnosis not present

## 2015-11-19 DIAGNOSIS — Z79899 Other long term (current) drug therapy: Secondary | ICD-10-CM | POA: Diagnosis not present

## 2015-11-19 DIAGNOSIS — R946 Abnormal results of thyroid function studies: Secondary | ICD-10-CM | POA: Diagnosis present

## 2015-11-19 DIAGNOSIS — N2581 Secondary hyperparathyroidism of renal origin: Secondary | ICD-10-CM | POA: Diagnosis present

## 2015-11-19 DIAGNOSIS — Z7982 Long term (current) use of aspirin: Secondary | ICD-10-CM | POA: Diagnosis not present

## 2015-11-19 DIAGNOSIS — I2510 Atherosclerotic heart disease of native coronary artery without angina pectoris: Secondary | ICD-10-CM | POA: Diagnosis not present

## 2015-11-19 DIAGNOSIS — D62 Acute posthemorrhagic anemia: Secondary | ICD-10-CM | POA: Diagnosis present

## 2015-11-19 DIAGNOSIS — T8612 Kidney transplant failure: Secondary | ICD-10-CM | POA: Diagnosis present

## 2015-11-19 DIAGNOSIS — Z91041 Radiographic dye allergy status: Secondary | ICD-10-CM | POA: Diagnosis not present

## 2015-11-19 DIAGNOSIS — D631 Anemia in chronic kidney disease: Secondary | ICD-10-CM | POA: Diagnosis present

## 2015-11-19 DIAGNOSIS — Z8249 Family history of ischemic heart disease and other diseases of the circulatory system: Secondary | ICD-10-CM | POA: Diagnosis not present

## 2015-11-19 DIAGNOSIS — N186 End stage renal disease: Secondary | ICD-10-CM | POA: Diagnosis present

## 2015-11-19 DIAGNOSIS — K439 Ventral hernia without obstruction or gangrene: Secondary | ICD-10-CM | POA: Diagnosis present

## 2015-11-19 LAB — BASIC METABOLIC PANEL
ANION GAP: 10 (ref 5–15)
BUN: 25 mg/dL — ABNORMAL HIGH (ref 6–20)
CALCIUM: 7.3 mg/dL — AB (ref 8.9–10.3)
CO2: 27 mmol/L (ref 22–32)
CREATININE: 10.35 mg/dL — AB (ref 0.61–1.24)
Chloride: 99 mmol/L — ABNORMAL LOW (ref 101–111)
GFR, EST AFRICAN AMERICAN: 5 mL/min — AB (ref >60)
GFR, EST NON AFRICAN AMERICAN: 5 mL/min — AB (ref >60)
Glucose, Bld: 108 mg/dL — ABNORMAL HIGH (ref 65–99)
Potassium: 3.8 mmol/L (ref 3.5–5.1)
SODIUM: 136 mmol/L (ref 135–145)

## 2015-11-19 LAB — CBC
HEMATOCRIT: 26.5 % — AB (ref 39.0–52.0)
Hemoglobin: 8.2 g/dL — ABNORMAL LOW (ref 13.0–17.0)
MCH: 27.8 pg (ref 26.0–34.0)
MCHC: 30.9 g/dL (ref 30.0–36.0)
MCV: 89.8 fL (ref 78.0–100.0)
PLATELETS: 188 10*3/uL (ref 150–400)
RBC: 2.95 MIL/uL — ABNORMAL LOW (ref 4.22–5.81)
RDW: 17.4 % — AB (ref 11.5–15.5)
WBC: 6.1 10*3/uL (ref 4.0–10.5)

## 2015-11-19 LAB — GLUCOSE, CAPILLARY
GLUCOSE-CAPILLARY: 103 mg/dL — AB (ref 65–99)
Glucose-Capillary: 103 mg/dL — ABNORMAL HIGH (ref 65–99)
Glucose-Capillary: 162 mg/dL — ABNORMAL HIGH (ref 65–99)
Glucose-Capillary: 109 mg/dL — ABNORMAL HIGH (ref 65–99)

## 2015-11-19 LAB — LIPID PANEL
CHOLESTEROL: 147 mg/dL (ref 0–200)
HDL: 22 mg/dL — AB (ref >40)
LDL CALC: 79 mg/dL (ref 0–99)
TRIGLYCERIDES: 229 mg/dL — AB (ref <150)
Total CHOL/HDL Ratio: 6.7 RATIO
VLDL: 46 mg/dL — ABNORMAL HIGH (ref 0–40)

## 2015-11-19 LAB — URINE CULTURE

## 2015-11-19 LAB — TSH: TSH: 7.292 u[IU]/mL — AB (ref 0.350–4.500)

## 2015-11-19 MED ORDER — DEXTROSE 5 % IV SOLN
1.0000 g | INTRAVENOUS | Status: DC
  Administered 2015-11-19 – 2015-11-23 (×5): 1 g via INTRAVENOUS
  Filled 2015-11-19 (×5): qty 10

## 2015-11-19 MED ORDER — ATORVASTATIN CALCIUM 10 MG PO TABS
10.0000 mg | ORAL_TABLET | Freq: Every day | ORAL | Status: DC
  Administered 2015-11-20 – 2015-11-22 (×3): 10 mg via ORAL
  Filled 2015-11-19 (×4): qty 1

## 2015-11-19 MED ORDER — DARBEPOETIN ALFA 200 MCG/0.4ML IJ SOSY
200.0000 ug | PREFILLED_SYRINGE | INTRAMUSCULAR | Status: DC
  Administered 2015-11-20: 200 ug via INTRAVENOUS
  Filled 2015-11-19: qty 0.4

## 2015-11-19 MED ORDER — COLCHICINE 0.6 MG PO TABS
0.3000 mg | ORAL_TABLET | Freq: Every day | ORAL | Status: DC
  Administered 2015-11-20 – 2015-11-23 (×4): 0.3 mg via ORAL
  Filled 2015-11-19 (×4): qty 1

## 2015-11-19 NOTE — Progress Notes (Addendum)
[facility] Progress Note   Subjective: No chest pain or SOB today. ("so far") Continues to have painless gross hematuria. Says he will be transferred to [facility] as soon as there is a bed available.    Filed Vitals:   11/18/15 1520 11/18/15 1856 11/18/15 2132 11/19/15 0532  BP: 117/65 111/62 102/65 119/71  Pulse: 86 86 77 80  Temp:   100.7 F (38.2 C) 98.9 F (37.2 C)  TempSrc:   Oral Oral  Resp:   20   Height:      Weight:    97.932 kg (215 lb 14.4 oz)  SpO2:   93% 98%    Exam: General: Alert Obese AAM NAD Heart: Regular S1S2 No S3 Lungs: CTA  Abdomen: obese, Soft , NT, ND,  R lower quadrant renal allograft nontender Extremities: no pedal edema Skin: no overt rash Neuro: Alert, oriented, appropriate Dialysis Access: RUE access patent   Inpatient medications: . aspirin EC  81 mg Oral Daily  . atorvastatin  10 mg Oral q1800  . calcium carbonate  1 tablet Oral Q breakfast  . carvedilol  25 mg Oral BID WC  . cefTRIAXone (ROCEPHIN)  IV  1 g Intravenous Q24H  . [START ON 11/20/2015] colchicine  0.3 mg Oral Daily  . insulin aspart  0-9 Units Subcutaneous TID WC  . isosorbide mononitrate  60 mg Oral Daily  . sodium chloride flush  3 mL Intravenous Q12H     acetaminophen **OR** acetaminophen, bisacodyl, gi cocktail, HYDROcodone-acetaminophen, ipratropium-albuterol, ondansetron **OR** ondansetron (ZOFRAN) IV, polyethylene glycol     Recent Labs Lab 11/17/15 1817 11/18/15 0617 11/19/15 0441  NA 136 137  136 136  K 3.4* 4.3  4.0 3.8  CL 98* 100*  99* 99*  CO2 24 23  24 27   GLUCOSE 94 97  98 108*  BUN 26* 14  14 25*  CREATININE 11.30* 7.31*  7.25* 10.35*  CALCIUM 7.0* 7.3*  7.3* 7.3*  PHOS  --  2.7  --     Recent Labs Lab 11/18/15 0617  ALBUMIN 2.7*    Recent Labs Lab 11/17/15 1817 11/18/15 0617 11/19/15 0441  WBC 5.9 6.2 6.1  HGB 7.4* 8.7* 8.2*  HCT 23.8* 28.5* 26.5*  MCV 87.8 89.1 89.8  PLT 189 194 188    Dialysis:  [place] on TTS . EDW 98.5 kg HD Bath 2k, 2,5 ca Time 4hrs Heparin 1600. Access RUA AVG  Hec 2 mcg IV/HD, mircera 129mcg q 2wks last on 11/13/15   CXR no edema, small effusions CT abd - transplant w mild hydro/ hydroureter, 3.8 mm stone at UVJ of transplanted kidney   Assessment: 1. ESRD / renal transplant (15 yrs esrd total, back on HD 2016) - TTS HD. Just in case not transferred today, will place orders for first round tomorrow. 2. Anemia, severe - admission Hb 6.4. Multiple etiologies-ESRD/ hematuria with kidney stone.GI source not ruled out.  2 units transfused this admission (as best I can tell) 3. Chest pain - hx single vessel CABG (remote) CABG. Coronary Ca by CT. Abnormal stress test 2016.  Medical therapy until can prove no GI bleed.  Nitrates added. NSTEMI d/t demand ischemia from low Hb. 4. Hypertension/volume - BP stable. No change EDW.  5. Anemia of ESRD- max esa (I note has had no aranesp this admission - will give 200 mcg with HD on Tuesday) 6. Metabolic bone disease withmild Hypocalcemia as op -ca corrected to 8.1 Noted not taking op PO calcitrol q  day Use 2.5 Ca bath / tells me uses Velphora as binder per his report. Use Oscal in hosp.  7. DM type 2- per admit 8. Hematuria/ transplant kidney hydro/ UVJ stone renal tx ureter - will likely need referral to [facility] where his transplant was done for this problem. Would not recommend transplant nephrostomy tube placement. Stone retrieval at a transplant center would be ideal Rx. He is not having pain, urine culture pending. Kidney is non-functional anyway. Primary team has call in to transplant center - waiting for a bed. 9. Pyuria - urine culture still pending. On rocephin.   Jamal Maes, MD [facility] [phone] Pager 11/19/2015, 12:27 PM

## 2015-11-19 NOTE — Progress Notes (Addendum)
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 He had also been experiencing hematuria for the last 1 month. He was hospitalized for further management.  Past medical history:  Past Medical History  Diagnosis Date  . Hypertension   . Diabetes mellitus without complication ([place])   . End-stage renal failure with renal transplant ([place])   . Gout   . Impotence of organic origin   . Type I (juvenile type) diabetes mellitus with renal manifestations, not stated as uncontrolled   . Secondary hyperparathyroidism [facility])     Consultants: Cardiology. Nephrology. Interventional radiology  Procedures: Hemodialysis  Antibiotics: None  Subjective: Patient feels well this morning. Shortness of breath has improved. Denies any chest pain. Continues to pass blood in the urine. Denies any abdominal pain, nausea, vomiting.   Objective:  Vital Signs  Filed Vitals:   11/18/15 1520 11/18/15 1856 11/18/15 2132 11/19/15 0532  BP: 117/65 111/62 102/65 119/71  Pulse: 86 86 77 80  Temp:   100.7 F (38.2 C) 98.9 F (37.2 C)  TempSrc:   Oral Oral  Resp:   20   Height:      Weight:    97.932 kg (215 lb 14.4 oz)  SpO2:   93% 98%    Intake/Output Summary (Last 24 hours) at 11/19/15 0745 Last data filed at 11/18/15 2300  Gross per 24 hour  Intake    380 ml  Output    100 ml  Net    280 ml   Filed Weights   11/17/15 2120 11/18/15 0413 11/19/15 0532  Weight: 97 kg (213 lb 13.5 oz) 97.977 kg (216 lb) 97.932 kg (215 lb 14.4 oz)    General appearance: alert, cooperative, appears stated age, no distress  and morbidly obese Resp: Improved air entry compared to the last 2 days. Continues to have few crackles at the bases. No wheezing or rhonchi. Cardio: regular rate and rhythm, S1, S2 normal, no murmur, click, rub or gallop GI: soft, non-tender; bowel sounds normal; no masses,  no organomegaly Extremities: extremities normal, atraumatic, no cyanosis or edema Neurologic: Awake and alert. Oriented 3. No focal neurological deficits.  Lab Results:  Data Reviewed: I have personally reviewed following labs and imaging studies  CBC:  Recent Labs Lab 11/17/15 0759 11/17/15 1817 11/18/15 0617 11/19/15 0441  WBC 5.0 5.9 6.2 6.1  HGB 6.4* 7.4* 8.7* 8.2*  HCT 20.2* 23.8* 28.5* 26.5*  MCV 89.8 87.8 89.1 89.8  PLT 169 189 194 0000000   Basic Metabolic Panel:  Recent Labs Lab 11/17/15 0759 11/17/15 1817 11/18/15 0617 11/19/15 0441  NA 136 136 137  136 136  K 3.2* 3.4* 4.3  4.0 3.8  CL 97* 98* 100*  99* 99*  CO2 24 24 23  24 27   GLUCOSE 93 94 97  98 108*  BUN 22* 26* 14  14 25*  CREATININE 10.95* 11.30* 7.31*  7.25* 10.35*  CALCIUM 7.1* 7.0* 7.3*  7.3* 7.3*  PHOS  --   --  2.7  --    GFR: Estimated Creatinine Clearance: 8.2 mL/min (by C-G formula based on Cr of 10.35). Liver Function Tests:  Recent Labs  Lab 11/18/15 0617  ALBUMIN 2.7*   Coagulation Profile:  Recent Labs Lab 11/17/15 1502  INR 1.19   Cardiac Enzymes:  Recent Labs Lab 11/17/15 1526 11/17/15 1817  TROPONINI 0.12* 0.12*   CBG:  Recent Labs Lab 11/18/15 0755 11/18/15 1113 11/18/15 1641 11/18/15 2131 11/19/15 0726  GLUCAP 88 77 97 101* 103*   Urine analysis:    Component Value Date/Time   COLORURINE RED* 11/17/2015 0910   APPEARANCEUR TURBID* 11/17/2015 0910   LABSPEC 1.035* 11/17/2015 0910   PHURINE 6.0 11/17/2015 0910   GLUCOSEU 100* 11/17/2015 0910   HGBUR LARGE* 11/17/2015 0910   BILIRUBINUR LARGE* 11/17/2015 0910   KETONESUR 40* 11/17/2015 0910   PROTEINUR >300* 11/17/2015  0910   UROBILINOGEN 0.2 11/24/2013 0242   NITRITE POSITIVE* 11/17/2015 0910   LEUKOCYTESUR LARGE* 11/17/2015 0910    Radiology Studies: Ct Abdomen Pelvis Wo Contrast  11/17/2015  CLINICAL DATA:  Low abdominal pain and hematuria beginning 1 day ago. History of a kidney transplant. EXAM: CT ABDOMEN AND PELVIS WITHOUT CONTRAST TECHNIQUE: Multidetector CT imaging of the abdomen and pelvis was performed following the standard protocol without IV contrast. COMPARISON:  None. FINDINGS: Lung bases: Heart normal in size. There are dense coronary artery calcifications. Trace right pleural fluid. Lung base subsegmental atelectasis. Ill-defined nodular opacity the left lower lobe measures 6 mm. Smaller partly imaged nodular opacity lies right upper lobe of the liver fissure. No evidence of pneumonia or pulmonary edema. Liver, spleen, gallbladder, pancreas, adrenal glands:  Unremarkable. Kidneys, ureters, bladder: Marked atrophy of the native kidneys with multiple bilateral cysts. No hydronephrosis. Normal native ureters. Right pelvic renal transplant shows perinephric stranding. The collecting system is not well-defined but appears least mildly dilated. The transplant ureter is dilated with periureteral stranding. A 3.8 mm stone projects at the renal transplant ureterovesicular junction. Bladder is mostly decompressed but otherwise unremarkable. Vascular: There are dense atherosclerotic calcifications along the abdominal aorta and its branch vessels. No aneurysm. Lymph nodes:  No pathologically enlarged lymph nodes. Ascites:  None. Gastrointestinal/ abdominal wall: Large right mid to lower quadrant hernia which contains small bowel without evidence of obstruction, strangulation or incarceration. There is a small fat containing umbilical hernia. There are scattered colonic diverticula. No diverticulitis. Appendix not visualized. Musculoskeletal:  No osteoblastic or osteolytic lesions. IMPRESSION: 1. 3.8 mm stone lies at  the ureterovesicular junction of the transplant kidney causing at least mild transplant kidney hydronephrosis and moderate hydroureter as well as periureteral and perinephric stranding. 2. No other acute findings. Electronically Signed   By: Lajean Manes M.D.   On: 11/17/2015 11:50   Dg Chest 2 View  11/17/2015  CLINICAL DATA:  Mid chest pain began last night with pain radiating into left arm with numbness/tingling left hand and fingers. SOB x 1 month. Hx of HTN- on meds; diabetic. EXAM: CHEST  2 VIEW COMPARISON:  10/30/2015 FINDINGS: Status post median sternotomy. Surgical clips are noted in the superior mediastinum. Right stent graft again noted. The heart is enlarged. Bilateral pleural effusions are present. No focal consolidations or pulmonary edema. IMPRESSION: 1. Cardiomegaly. 2. Small bilateral pleural effusions. Electronically Signed   By: Nolon Nations M.D.   On: 11/17/2015 08:45     Medications:  Scheduled: . aspirin EC  81 mg Oral Daily  . calcium carbonate  1 tablet Oral Q breakfast  . carvedilol  25 mg Oral BID WC  . colchicine  0.6 mg Oral Daily  . insulin aspart  0-9 Units Subcutaneous TID WC  .  isosorbide mononitrate  60 mg Oral Daily  . sodium chloride flush  3 mL Intravenous Q12H   Continuous:  [id number] **OR** acetaminophen, bisacodyl, gi cocktail, HYDROcodone-acetaminophen, ipratropium-albuterol, ondansetron **OR** ondansetron (ZOFRAN) IV, polyethylene glycol  Assessment/Plan:  Active Problems:   Essential hypertension   Diabetes mellitus type 2, controlled ([place])   Kidney transplant failure   Dyspnea   Chest pain   Chest pain at rest   Normocytic anemia   Gynecomastia   Renal stone   Cough    Non-exertional chest pain/dyspnea/previous history of single-vessel bypass about 20 years ago Patient seen by cardiology. Appreciate their input. Currently plan is for medical management alone. Patient has been started on nitrates. Dose of carvedilol has been  increased. Continue aspirin. Lipid panel reveals LDL of 79. Triglyceride 229. Consider statin. No plans for any coronary intervention at this time due to his other comorbidities.  Gross hematuria/renal stone CTscan reveals 8 mm stone at the ureterovesicular junction of the transplant kidney with mild transplant kidney hydronephrosis and moderate hydroureter as well as periureteral and perinephric stranding. Admitting physician discussed with urology. They recommended consulting interventional radiology for nephrostomy. Apparently transplanted kidney cannot be stented. Discussed with Dr. Lorrene Reid with nephrology this morning and also with Dr. Anselm Pancoast with interventional radiology. It would be best that the patient is seen for his condition at his transplant center rather than having interventions here. Await callback from [facility]. Patient continues to have hematuria. He does not have any abdominal pain. INR is normal. His UA does suggest infection as well as. There is low-grade fever. We will place him on ceftriaxone.  Normocytic anemia/acute blood loss anemia He has chronic anemia with wide variations in hgb from mid 8 to mid 11 range. Recieved a unit of blood 2 weeks ago for hgb of 7.9. Despite transfusion his hgb has further declined to 6.4. Suspect progressive anemia secondary to ongoing gross hematuria. He received 2 units of blood. Hemoglobin has responded appropriately. Monitor CBCs closely. Transfuse if hemoglobin drops below 8.  Cough Productive of clear phlegm. Recently treated for HCAP. No new infiltrates identified on chest x-ray. He is afebrile. Cough could be due to his fluid overload.   ESRD/status post renal transplant 2009 Failed transplant. Dialyzes Tu / Thur/ Sat. Nephrology is following. Patient was dialyzed 4/22.   Essential hypertension Continue home medications. Monitor blood pressures closely.  Elevated TSH No known history of hypothyroidism. This could be  sick euthyroid. Will check free T4.  Diabetes mellitus type 2, controlled A1c was 5.4 two weeks ago. Continue to monitor CBGs. Would avoid resuming any of his home medications to prevent hypoglycemia. This is considering his HbA1c.   Gynecomastia Etiology is unclear. Breast tissue growth L > R over last couple of months. Patient is supposed to see Endocrinology soon. Complains of intermittent discomfort in left breast. Denies recent use of aldactone.  Large RLQ ventral hernia Chronic.  DVT Prophylaxis: SCDs    Code Status: Full code  Family Communication: Discussed with the patient  Disposition Plan: Possible transfer to [facility].    LOS: 2 days   [facility] Pager [phone] 11/19/2015, 7:45 AM  If 7PM-7AM, please contact night-coverage at [email or web address], password [email or web address]

## 2015-11-19 NOTE — Discharge Summary (Signed)
[facility]  Physician Discharge Summary   Patient ID: Patrick [name] MRN: [id number] DOB/AGE: [birth year] 60 y.o.  Admit date: 11/17/2015 Discharge date: 11/19/2015  PCP: Elyn Peers, MD  DISCHARGE DIAGNOSES:  Active Problems:   Essential hypertension   Diabetes mellitus type 2, controlled ([place])   Kidney transplant failure   Dyspnea   Chest pain   Chest pain at rest   Normocytic anemia   Gynecomastia   Renal stone   Cough   RECOMMENDATIONS FOR OUTPATIENT FOLLOW UP: 1. 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 He had also been experiencing hematuria for the last 1 month. He was hospitalized for further management.  Consultants: Cardiology. Nephrology. Interventional radiology  Procedures: Hemodialysis. Blood transfusion  HOSPITAL COURSE:   Non-exertional chest pain/dyspnea/previous history of single-vessel bypass about 20 years ago Patient seen by cardiology. Appreciate their input. Currently plan is for medical management alone. Patient has been started on nitrates. Dose of carvedilol has been increased. Continue aspirin. Lipid panel reveals LDL of 79. Triglyceride 229. Started on statin. No plans for any coronary intervention at this time due to his other comorbidities.  Gross hematuria/8 mm stone at the ureterovesicular junction  CTscan reveals 8 mm stone at the ureterovesicular  junction of the transplant kidney with mild transplant kidney hydronephrosis and moderate hydroureter as well as periureteral and perinephric stranding. Admitting physician discussed with urology. They recommended consulting interventional radiology for nephrostomy. Apparently transplanted kidney cannot be stented. Discussed with Dr. Lorrene Reid with nephrology this morning and also with Dr. Anselm Pancoast with interventional radiology. It would be best that the patient is seen for his condition at his transplant center rather than having interventions here. Discussed with providers at [facility], including transplant nephrologist, Dr. Altamease Oiler as well as hospitalist, Dr. [name]. They have accepted the patient in transfer. Patient continues to have hematuria. He does not have any abdominal pain. INR is normal. His UA does suggest infection as well as. There is low-grade fever. He was started on ceftriaxone.  Normocytic anemia/acute blood loss anemia He has chronic anemia with wide variations in hgb from mid 8 to mid 11 range. Recieved a unit of blood 2 weeks ago . Despite transfusion his hgb has further declined to 6.4. Suspect progressive anemia secondary to ongoing gross hematuria. He received 2 units of blood during this hospitalization. Hemoglobin has responded appropriately.   Cough Productive of clear phlegm. Recently treated for HCAP. No new infiltrates identified on chest x-ray. He is afebrile. Cough could be due to his fluid overload.   ESRD/status post renal transplant 2009 Failed transplant. Dialyzes Tu / Thur/ Sat. Nephrology was consulted. Patient was dialyzed 4/22.   Essential hypertension Continue home medications. Monitor blood pressures closely.  Elevated TSH No known history of hypothyroidism. This could be sick euthyroid. Repeat TSH and free T4 is ordered for tomorrow,/25. However, if the patient goes to [facility] prior to that this should be checked over  there.  Diabetes  mellitus type 2, controlled A1c was 5.4 two weeks ago. Continue to monitor CBGs. Would avoid resuming any of his home medications to prevent hypoglycemia. This is considering his HbA1c.   Gynecomastia Etiology is unclear. Breast tissue growth L > R over last couple of months. Patient is supposed to see Endocrinology soon. Complains of intermittent discomfort in left breast. Denies recent use of aldactone.  Large RLQ ventral hernia Chronic.  Overall, stable. Await transfer to [facility]. All of the above discussed with the patient as well.   PERTINENT LABS:  The results of significant diagnostics from this hospitalization (including imaging, microbiology, ancillary and laboratory) are listed below for reference.     Labs: Basic Metabolic Panel:  Recent Labs Lab 11/17/15 0759 11/17/15 1817 11/18/15 0617 11/19/15 0441  NA 136 136 137  136 136  K 3.2* 3.4* 4.3  4.0 3.8  CL 97* 98* 100*  99* 99*  CO2 24 24 23  24 27   GLUCOSE 93 94 97  98 108*  BUN 22* 26* 14  14 25*  CREATININE 10.95* 11.30* 7.31*  7.25* 10.35*  CALCIUM 7.1* 7.0* 7.3*  7.3* 7.3*  PHOS  --   --  2.7  --    Liver Function Tests:  Recent Labs Lab 11/18/15 0617  ALBUMIN 2.7*   CBC:  Recent Labs Lab 11/17/15 0759 11/17/15 1817 11/18/15 0617 11/19/15 0441  WBC 5.0 5.9 6.2 6.1  HGB 6.4* 7.4* 8.7* 8.2*  HCT 20.2* 23.8* 28.5* 26.5*  MCV 89.8 87.8 89.1 89.8  PLT 169 189 194 188   Cardiac Enzymes:  Recent Labs Lab 11/17/15 1526 11/17/15 1817  TROPONINI 0.12* 0.12*   BNP: BNP (last 3 results)  Recent Labs  10/30/15 0855 11/17/15 0759  BNP 2633.8* 2608.9*    CBG:  Recent Labs Lab 11/18/15 1113 11/18/15 1641 11/18/15 2131 11/19/15 0726 11/19/15 1125  GLUCAP 77 97 101* 103* 162*     IMAGING STUDIES Ct Abdomen Pelvis Wo Contrast  11/17/2015  CLINICAL DATA:  Low abdominal pain and hematuria beginning 1 day ago. History of a kidney transplant. EXAM: CT  ABDOMEN AND PELVIS WITHOUT CONTRAST TECHNIQUE: Multidetector CT imaging of the abdomen and pelvis was performed following the standard protocol without IV contrast. COMPARISON:  None. FINDINGS: Lung bases: Heart normal in size. There are dense coronary artery calcifications. Trace right pleural fluid. Lung base subsegmental atelectasis. Ill-defined nodular opacity the left lower lobe measures 6 mm. Smaller partly imaged nodular opacity lies right upper lobe of the liver fissure. No evidence of pneumonia or pulmonary edema. Liver, spleen, gallbladder, pancreas, adrenal glands:  Unremarkable. Kidneys, ureters, bladder: Marked atrophy of the native kidneys with multiple bilateral cysts. No hydronephrosis. Normal native ureters. Right pelvic renal transplant shows perinephric stranding. The collecting system is not well-defined but appears least mildly dilated. The transplant ureter is dilated with periureteral stranding. A 3.8 mm stone projects at the renal transplant ureterovesicular junction. Bladder is mostly decompressed but otherwise unremarkable. Vascular: There are dense atherosclerotic calcifications along the abdominal aorta and its branch vessels. No aneurysm. Lymph nodes:  No pathologically enlarged lymph nodes. Ascites:  None. Gastrointestinal/ abdominal wall: Large right mid to lower quadrant hernia which contains small bowel without evidence of obstruction, strangulation or incarceration. There is a small fat containing umbilical hernia. There are scattered colonic diverticula. No diverticulitis. Appendix not visualized. Musculoskeletal:  No osteoblastic or osteolytic lesions. IMPRESSION: 1. 3.8 mm stone lies at the ureterovesicular junction of  the transplant kidney causing at least mild transplant kidney hydronephrosis and moderate hydroureter as well as periureteral and perinephric stranding. 2. No other acute findings. Electronically Signed   By: Lajean Manes M.D.   On: 11/17/2015 11:50   Dg Chest 2  View  11/17/2015  CLINICAL DATA:  Mid chest pain began last night with pain radiating into left arm with numbness/tingling left hand and fingers. SOB x 1 month. Hx of HTN- on meds; diabetic. EXAM: CHEST  2 VIEW COMPARISON:  10/30/2015 FINDINGS: Status post median sternotomy. Surgical clips are noted in the superior mediastinum. Right stent graft again noted. The heart is enlarged. Bilateral pleural effusions are present. No focal consolidations or pulmonary edema. IMPRESSION: 1. Cardiomegaly. 2. Small bilateral pleural effusions. Electronically Signed   By: Nolon Nations M.D.   On: 11/17/2015 08:45   Dg Chest 2 View  10/30/2015  CLINICAL DATA:  60 year old male with chest pain shortness of breath and gross hematuria for 1 week. Initial encounter. EXAM: CHEST  2 VIEW COMPARISON:  Chest radiographs 10/11/2015 hit and earlier. FINDINGS: Increased basilar opacity which on the lateral view appears to be pooling in the costophrenic angles compatible with small effusions. Stable cardiomegaly and mediastinal contours. No pneumothorax. No acute pulmonary edema. No other confluent pulmonary opacity. Stable peritracheal and mediastinal surgical clips and/or vascular stent material. No acute osseous abnormality identified. IMPRESSION: New small bilateral pleural effusions. Difficult to exclude associated mild or developing lower lobe infection. No other acute cardiopulmonary abnormality. Electronically Signed   By: Genevie Ann M.D.   On: 10/30/2015 09:20    DISPOSITION: Patient being transferred to [facility] care   ALLERGIES:  Allergies  Allergen Reactions  . Iodinated Diagnostic Agents Rash    Other Reaction: burning to mouth, swelling of lips.    Current Inpatient Medications:  Scheduled: . aspirin EC  81 mg Oral Daily  . atorvastatin  10 mg Oral q1800  . calcium carbonate  1 tablet Oral Q breakfast  . carvedilol  25 mg Oral BID WC  . cefTRIAXone (ROCEPHIN)  IV  1 g  Intravenous Q24H  . [START ON 11/20/2015] colchicine  0.3 mg Oral Daily  . [START ON 11/20/2015] darbepoetin (ARANESP) injection - DIALYSIS  200 mcg Intravenous Q Tue-HD  . insulin aspart  0-9 Units Subcutaneous TID WC  . isosorbide mononitrate  60 mg Oral Daily  . sodium chloride flush  3 mL Intravenous Q12H   Continuous:  [id number] **OR** acetaminophen, bisacodyl, gi cocktail, HYDROcodone-acetaminophen, ipratropium-albuterol, ondansetron **OR** ondansetron (ZOFRAN) IV, polyethylene glycol  TOTAL DISCHARGE TIME: 35 mins  [facility] Pager [phone]  11/19/2015, 12:37 PM

## 2015-11-19 NOTE — Progress Notes (Addendum)
Subjective:  No further chest pain. No specific complaints  Objective:  Vital Signs in the last 24 hours: Temp:  [98.9 F (37.2 C)-100.7 F (38.2 C)] 98.9 F (37.2 C) (04/24 0532) Pulse Rate:  [77-100] 80 (04/24 0532) Resp:  [20] 20 (04/23 2132) BP: (102-136)/(62-78) 119/71 mmHg (04/24 0532) SpO2:  [93 %-98 %] 98 % (04/24 0532) Weight:  [97.932 kg (215 lb 14.4 oz)] 97.932 kg (215 lb 14.4 oz) (04/24 0532)  Intake/Output from previous day: 04/23 0701 - 04/24 0700 In: 380 [P.O.:380] Out: 100 [Urine:100]  Physical Exam:  General appearance: alert, cooperative, appears older than stated age and no distress Lungs: clear to auscultation bilaterally Heart: regular rate and rhythm, S1, S2 normal, no murmur, click, rub or gallop Abdomen: soft, non-tender; bowel sounds normal; no masses, no organomegaly and llarge incisional hernia at the previously transplanted kidney site present. Extremities: extremities normal, atraumatic, no cyanosis or edema and Skin appears to have chronic ischemia with loss of hair and shiny skin. Pulses: carotid arteries normal, bilateral femoral arteries one to 2+ without bruit. Popliteal pulses absent pedal pulses absent bilaterally. Right brachial AV fistula present, functioning. Previous right forearm and left forearm fistula present, nonfunctioning.  Lab Results: BMP  Recent Labs  11/17/15 1817 11/18/15 0617 11/19/15 0441  NA 136 137  136 136  K 3.4* 4.3  4.0 3.8  CL 98* 100*  99* 99*  CO2 24 23  24 27   GLUCOSE 94 97  98 108*  BUN 26* 14  14 25*  CREATININE 11.30* 7.31*  7.25* 10.35*  CALCIUM 7.0* 7.3*  7.3* 7.3*  GFRNONAA 4* 7*  7* 5*  GFRAA 5* 8*  8* 5*    CBC  Recent Labs Lab 11/19/15 0441  WBC 6.1  RBC 2.95*  HGB 8.2*  HCT 26.5*  PLT 188  MCV 89.8  MCH 27.8  MCHC 30.9  RDW 17.4*    HEMOGLOBIN A1C Lab Results  Component Value Date   HGBA1C 5.4 10/11/2015   MPG 108 10/11/2015     Recent Labs  11/17/15 1526  11/17/15 1817  TROPONINI 0.12* 0.12*    Recent Labs  11/19/15 0441  TSH 7.292*   Lipid Panel     Component Value Date/Time   CHOL 147 11/19/2015 0441   TRIG 229* 11/19/2015 0441   HDL 22* 11/19/2015 0441   CHOLHDL 6.7 11/19/2015 0441   VLDL 46* 11/19/2015 0441   LDLCALC 79 11/19/2015 0441    Recent Labs  10/13/15 0737 10/25/15 1704 11/18/15 0617  PROT 7.5 7.5  --   ALBUMIN 2.5* 2.7* 2.7*  AST 22 24  --   ALT 12* 13*  --   ALKPHOS 87 49  --   BILITOT 0.3 0.5  --     EKG 11/18/2015: Normal sinus rhythm/sinus tachycardia at the rate of 112 bpm, LVH with repolarization abnormality, cannot exclude a lateral ischemia. Nonspecific T abnormality. PVC. Compared to 10/30/2015, sinus tachycardia new.  Echocardiogram. 11/29/2013 Left ventricle: The cavity size was normal. Wall thickness was increased in a pattern of mild LVH. Systolic function was normal. The estimated ejection fraction was in the range of 50% to 55%. Wall motion was normal; there were no regional wall motion abnormalities. Doppler parameters are consistent with abnormal left ventricular relaxation (grade 1 diastolic dysfunction). Mitral valve: Calcified annulus. Mildly thickened leafletsLeft atrium: The atrium was mildly dilated.  Lexiscan myoview stress test 01/08/2015: 1. The resting electrocardiogram demonstrated normal sinus rhythm and no resting arrhythmias. Non  specific T inversion high lateral leads. Stress EKG is non-diagnostic for ischemia as it a pharmacologic stress using Lexiscan. Stress symptoms included dyspnea. 2. The perfusion imaging study demonstrates a very small-sized moderate ischemia in the basal inferior, inferoapical and apical septal region. Left ventricle systolic function was preserved with an ejection fraction of 55%. This is an abnormal stress test, however low risk due to size of the defect.  Radiology: CT scan of the abdomen and pelvis without contrast 11/17/2015: IMPRESSION:   1. 3.8 mm stone lies at the ureterovesicular junction of the transplant kidney causing at least mild transplant kidney hydronephrosis and moderate hydroureter as well as periureteral and perinephric stranding. 2. No other acute findings. 3. Dense coronary calcification.  Electronically Signed By: Lajean Manes M.D. On: 11/17/2015 11:50   Dg Chest 2 View  11/17/2015 CLINICAL DATA: Mid chest pain began last night with pain radiating into left arm with numbness/tingling left hand and fingers. SOB x 1 month. Hx of HTN- on meds; diabetic. EXAM: CHEST 2 VIEW COMPARISON: 10/30/2015 FINDINGS: Status post median sternotomy. Surgical clips are noted in the superior mediastinum. Right stent graft again noted. The heart is enlarged. Bilateral pleural effusions are present. No focal consolidations or pulmonary edema. IMPRESSION: 1. Cardiomegaly. 2. Small bilateral pleural effusions. Electronically Signed By: Nolon Nations M.D. On: 11/17/2015 08:45   Assessment/Plan:  1. Atypical Chest pain probably angina pectoris, chronic stable angina.  H/O abnormal stress test in June 2016 (see above). Severe coronary calcification by CT scan.  2. 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 S/P LIMA to LAD: Due to anomalous origin of the left coronary artery from the right coronary cusp in May 1997.  3. History of NSVT in 2015 admission, no recurrence on telemetry this admission. 4. H/O Renal transplant and rejection, presently back on HD. 5. IDDM (type I) controlled 6. PAD with symptoms of claudication in bilateral lower extremity is. 7. Hypertension. 8. Severe anemia of chronic disease, specifically renal failure, hematurea. 9. Abnormal TSH.  Rec: I have added Lipitor due to coronary calcification and hypertriglyceridemia. Would recommend medical therapy only for now given multiple medical comorbidities.  I will be happy to see her in the OP basis upon  discharge.   He also has PAD along with CAD, but again medial therapy for now.     Adrian Prows, M.D. 11/19/2015, 12:53 PM [facility] Pager: [phone] Office: [phone] If no answer: [phone]

## 2015-11-19 NOTE — Progress Notes (Signed)
Pt educated and refusing insulin.

## 2015-11-19 NOTE — Progress Notes (Signed)
Nutrition Brief Note  Patient identified on the Malnutrition Screening Tool (MST) Report. Patient with ~2% weight loss over the past month or so, which is not a significant amount. He reports losing weight because "I didn't feel well." Appetite and intake have improved.  Wt Readings from Last 15 Encounters:  11/19/15 215 lb 14.4 oz (97.932 kg)  11/07/15 225 lb (102.059 kg)  10/30/15 219 lb 9.3 oz (99.6 kg)  10/25/15 223 lb 12.3 oz (101.5 kg)  10/14/15 225 lb 8 oz (102.286 kg)  10/03/15 225 lb (102.059 kg)  09/02/15 224 lb (101.606 kg)  06/06/15 224 lb (101.606 kg)  03/06/15 217 lb (98.431 kg)  12/06/14 198 lb 14.4 oz (90.22 kg)  11/27/14 202 lb (91.627 kg)  09/18/14 182 lb 1.6 oz (82.6 kg)  08/28/14 182 lb (82.555 kg)  08/02/14 193 lb 4.8 oz (87.68 kg)  06/13/14 206 lb (93.441 kg)    Body mass index is 34.86 kg/(m^2). Patient meets criteria for obesity based on current BMI.   Current diet order is renal/CHO modified, patient reports good intake. Labs and medications reviewed.   No nutrition interventions warranted at this time. If nutrition issues arise, please consult RD.   Molli Barrows, RD, LDN, [place] Pager [phone] After Hours Pager [phone]

## 2015-11-19 NOTE — Care Management Obs Status (Signed)
[place] NOTIFICATION   Patient Details  Name: Jesean Wichert MRN: [id number] Date of Birth: [birth year]   Medicare Observation Status Notification Given:  Yes (Chest Pain)    Bethena Roys, RN 11/19/2015, 11:46 AM

## 2015-11-20 DIAGNOSIS — N186 End stage renal disease: Secondary | ICD-10-CM

## 2015-11-20 DIAGNOSIS — Z992 Dependence on renal dialysis: Secondary | ICD-10-CM

## 2015-11-20 LAB — RENAL FUNCTION PANEL
ANION GAP: 15 (ref 5–15)
Albumin: 2.5 g/dL — ABNORMAL LOW (ref 3.5–5.0)
BUN: 34 mg/dL — AB (ref 6–20)
CHLORIDE: 97 mmol/L — AB (ref 101–111)
CO2: 22 mmol/L (ref 22–32)
Calcium: 6.8 mg/dL — ABNORMAL LOW (ref 8.9–10.3)
Creatinine, Ser: 13.53 mg/dL — ABNORMAL HIGH (ref 0.61–1.24)
GFR calc Af Amer: 4 mL/min — ABNORMAL LOW (ref >60)
GFR calc non Af Amer: 3 mL/min — ABNORMAL LOW (ref >60)
GLUCOSE: 83 mg/dL (ref 65–99)
PHOSPHORUS: 2.9 mg/dL (ref 2.5–4.6)
POTASSIUM: 3.8 mmol/L (ref 3.5–5.1)
Sodium: 134 mmol/L — ABNORMAL LOW (ref 135–145)

## 2015-11-20 LAB — CBC
HEMATOCRIT: 23.9 % — AB (ref 39.0–52.0)
Hemoglobin: 7.4 g/dL — ABNORMAL LOW (ref 13.0–17.0)
MCH: 27.6 pg (ref 26.0–34.0)
MCHC: 31 g/dL (ref 30.0–36.0)
MCV: 89.2 fL (ref 78.0–100.0)
PLATELETS: 185 10*3/uL (ref 150–400)
RBC: 2.68 MIL/uL — ABNORMAL LOW (ref 4.22–5.81)
RDW: 17.2 % — AB (ref 11.5–15.5)
WBC: 6 10*3/uL (ref 4.0–10.5)

## 2015-11-20 LAB — T4, FREE: Free T4: 0.91 ng/dL (ref 0.61–1.12)

## 2015-11-20 LAB — GLUCOSE, CAPILLARY
GLUCOSE-CAPILLARY: 87 mg/dL (ref 65–99)
Glucose-Capillary: 119 mg/dL — ABNORMAL HIGH (ref 65–99)
Glucose-Capillary: 87 mg/dL (ref 65–99)

## 2015-11-20 LAB — PREPARE RBC (CROSSMATCH)

## 2015-11-20 LAB — TSH: TSH: 9.512 u[IU]/mL — AB (ref 0.350–4.500)

## 2015-11-20 MED ORDER — DARBEPOETIN ALFA 200 MCG/0.4ML IJ SOSY
PREFILLED_SYRINGE | INTRAMUSCULAR | Status: AC
  Filled 2015-11-20: qty 0.4

## 2015-11-20 MED ORDER — SODIUM CHLORIDE 0.9 % IV SOLN: Freq: Once | INTRAVENOUS | Status: DC

## 2015-11-20 NOTE — Progress Notes (Signed)
[facility] PROGRESS NOTE  Patrick [name] [id number] DOB: 01-Nov-1955 DOA: 11/17/2015  PCP: Elyn Peers, MD  Brief HPI: 60 year old male with a past medical history of end-stage renal disease on hemodialysis Tuesday, Thursday, Saturday, status post failed kidney transplant, history of single-vessel bypass for anomalous left main artery about 20 years ago in [place], presented with complaints of chest pain and shortness of breath. He had also been experiencing hematuria for the last 1 month. He was hospitalized for further management. Seen by cardiology and only medical management for now. Underwent dialysis with improvement in symptoms. Hematuria persists and he is waiting on transferred to [facility].  Past medical history:  Past Medical History  Diagnosis Date  . Hypertension   . Diabetes mellitus without complication ([place])   . End-stage renal failure with renal transplant ([place])   . Gout   . Impotence of organic origin   . Type I (juvenile type) diabetes mellitus with renal manifestations, not stated as uncontrolled   . Secondary hyperparathyroidism [facility])     Consultants: Cardiology. Nephrology. Interventional radiology  Procedures: Hemodialysis  Antibiotics: None  Subjective: Patient seen while he was undergoing hemodialysis. He feels well. Continues to have bloody urine. Denies any pain.   Objective:  Vital Signs  Filed Vitals:   11/20/15 0900 11/20/15 0930 11/20/15 0940 11/20/15 0955  BP: 143/104 160/89 160/89 148/81  Pulse: 84 87 80 85  Temp:   97.8 F (36.6 C) 98.5 F (36.9 C)  TempSrc:   Oral Oral  Resp: 16 22 21 25   Height:      Weight:      SpO2:   100% 99%    Intake/Output Summary (Last 24 hours) at 11/20/15 1000 Last data filed at 11/20/15 0645  Gross per 24 hour  Intake    610 ml  Output    300 ml  Net    310 ml   Filed Weights   11/19/15 0532 11/20/15 0500 11/20/15 0700  Weight: 97.932 kg (215 lb 14.4 oz) 97.705 kg (215 lb 6.4 oz) 97.8  kg (215 lb 9.8 oz)    General appearance: alert, cooperative, appears stated age, no distress and morbidly obese Resp: Improved air entry compared to the last 2 days. Continues to have few crackles at the bases. No wheezing or rhonchi. Cardio: regular rate and rhythm, S1, S2 normal, no murmur, click, rub or gallop GI: soft, non-tender; bowel sounds normal; no masses,  no organomegaly Extremities: extremities normal, atraumatic, no cyanosis or edema Neurologic: Awake and alert. Oriented 3. No focal neurological deficits.  Lab Results:  Data Reviewed: I have personally reviewed following labs and imaging studies  CBC:  Recent Labs Lab 11/17/15 0759 11/17/15 1817 11/18/15 0617 11/19/15 0441 11/20/15 0806  WBC 5.0 5.9 6.2 6.1 6.0  HGB 6.4* 7.4* 8.7* 8.2* 7.4*  HCT 20.2* 23.8* 28.5* 26.5* 23.9*  MCV 89.8 87.8 89.1 89.8 89.2  PLT 169 189 194 188 [id number]   Basic Metabolic Panel:  Recent Labs Lab 11/17/15 0759 11/17/15 1817 11/18/15 0617 11/19/15 0441 11/20/15 0807  NA 136 136 137  136 136 134*  K 3.2* 3.4* 4.3  4.0 3.8 3.8  CL 97* 98* 100*  99* 99* 97*  CO2 24 24 23  24 27 22   GLUCOSE 93 94 97  98 108* 83  BUN 22* 26* 14  14 25* 34*  CREATININE 10.95* 11.30* 7.31*  7.25* 10.35* 13.53*  CALCIUM 7.1* 7.0* 7.3*  7.3* 7.3* 6.8*  PHOS  --   --  2.7  --  2.9   GFR: Estimated Creatinine Clearance: 6.3 mL/min (by C-G formula based on Cr of 13.53). Liver Function Tests:  Recent Labs Lab 11/18/15 0617 11/20/15 0807  ALBUMIN 2.7* 2.5*   Coagulation Profile:  Recent Labs Lab 11/17/15 1502  INR 1.19   Cardiac Enzymes:  Recent Labs Lab 11/17/15 1526 11/17/15 1817  TROPONINI 0.12* 0.12*   CBG:  Recent Labs Lab 11/18/15 2131 11/19/15 0726 11/19/15 1125 11/19/15 1639 11/19/15 2125  GLUCAP 101* 103* 162* 103* 109*   Urine analysis:    Component Value Date/Time   COLORURINE RED* 11/17/2015 0910   APPEARANCEUR TURBID* 11/17/2015 0910   LABSPEC 1.035*  11/17/2015 0910   PHURINE 6.0 11/17/2015 0910   GLUCOSEU 100* 11/17/2015 0910   HGBUR LARGE* 11/17/2015 0910   BILIRUBINUR LARGE* 11/17/2015 0910   KETONESUR 40* 11/17/2015 0910   PROTEINUR >300* 11/17/2015 0910   UROBILINOGEN 0.2 11/24/2013 0242   NITRITE POSITIVE* 11/17/2015 0910   LEUKOCYTESUR LARGE* 11/17/2015 0910    Radiology Studies: No results found.   Medications:  Scheduled: . sodium chloride   Intravenous Once  . aspirin EC  81 mg Oral Daily  . atorvastatin  10 mg Oral q1800  . calcium carbonate  1 tablet Oral Q breakfast  . carvedilol  25 mg Oral BID WC  . cefTRIAXone (ROCEPHIN)  IV  1 g Intravenous Q24H  . colchicine  0.3 mg Oral Daily  . darbepoetin (ARANESP) injection - DIALYSIS  200 mcg Intravenous Q Tue-HD  . insulin aspart  0-9 Units Subcutaneous TID WC  . isosorbide mononitrate  60 mg Oral Daily  . sodium chloride flush  3 mL Intravenous Q12H   Continuous:  [id number] **OR** acetaminophen, bisacodyl, gi cocktail, HYDROcodone-acetaminophen, ipratropium-albuterol, ondansetron **OR** ondansetron (ZOFRAN) IV, polyethylene glycol  Assessment/Plan:  Active Problems:   Essential hypertension   Diabetes mellitus type 2, controlled ([place])   Kidney transplant failure   Dyspnea   Chest pain   Chest pain at rest   Normocytic anemia   Gynecomastia   Renal stone   Cough    Non-exertional chest pain/dyspnea/previous history of single-vessel bypass about 20 years ago Patient seen by cardiology. Appreciate Dr. Irven Shelling input. Currently plan is for medical management alone. Patient has been started on nitrates. Dose of carvedilol has been increased. Continue aspirin. Lipid panel reveals LDL of 79. Triglyceride 229. Statin has been initiated.  No plans for any coronary intervention at this time due to his other comorbidities. Patient will need to be followed up as outpatient.  Gross hematuria/renal stone CTscan reveals 8 mm stone at the ureterovesicular  junction of the transplant kidney with mild transplant kidney hydronephrosis and moderate hydroureter as well as periureteral and perinephric stranding. Admitting physician discussed with urology. They recommended consulting interventional radiology for nephrostomy. Apparently transplanted kidney cannot be stented. Discussed with Dr. Lorrene Reid with nephrology and also with Dr. Anselm Pancoast with interventional radiology. It would be best that the patient is seen for his condition at his transplant center rather than having interventions here. Discussed with providers at [facility]. He has been accepted in transfer. We are awaiting a bed. Patient continues to have hematuria. He does not have any abdominal pain. INR is normal. His UA does suggest infection as well as. He did have low-grade fever and so he was started on ceftriaxone. Urine cultures obtained on April 23, did not show any significant growth. We will repeat.  Normocytic anemia/acute blood loss anemia He has chronic  anemia with wide variations in hgb from mid 8 to mid 11 range. Recieved a unit of blood 2 weeks ago for hgb of 7.9. Despite transfusion his hgb has further declined to 6.4. Suspect progressive anemia secondary to ongoing gross hematuria. He received 2 units of blood while he was in the emergency department. Hemoglobin had responded appropriately. Hemoglobin again trending downwards. He will benefit from another unit of blood. This will be ordered. Monitor CBCs closely.   Cough Productive of clear phlegm. Recently treated for HCAP. No new infiltrates identified on chest x-ray. He is afebrile. Cough could be due to his fluid overload.   ESRD/status post renal transplant 2009 Failed transplant. Dialyzes Tu / Thur/ Sat. Nephrology is following. Patient was dialyzed 4/22.   Essential hypertension Continue home medications. Monitor blood pressures closely.  Elevated TSH No known history of hypothyroidism. This could be sick  euthyroid. Free T4 is normal. No further workup at this time.  Diabetes mellitus type 2, controlled A1c was 5.4 two weeks ago. Continue to monitor CBGs. Would avoid resuming any of his home medications to prevent hypoglycemia. This is considering his HbA1c.   Gynecomastia Etiology is unclear. Breast tissue growth L > R over last couple of months. Patient is supposed to see Endocrinology soon. Complains of intermittent discomfort in left breast. Denies recent use of aldactone.  Large RLQ ventral hernia Chronic.  DVT Prophylaxis: SCDs    Code Status: Full code  Family Communication: Discussed with the patient  Disposition Plan: Await transfer to [facility].    LOS: 3 days   [facility] [facility] Pager [phone] 11/20/2015, 10:00 AM  If 7PM-7AM, please contact night-coverage at [email or web address], password [facility]

## 2015-11-20 NOTE — Clinical Documentation Improvement (Signed)
Hospitalist and/or Nephrology  Please document query responses in the progress notes and discharge summary, not on the CDI BPA form.  Thank you.  Query 1 of 2  Patient's presenting complaints were Chest Pain and Shortness of Breath.   Cause and effect relationships cannot be assumed and must be documented by the attending provider.  Please document the Suspected or Known cause of the patient's Chest Pain, including associated causes and/or conditions:  - Chest Pain secondary to anemia  - Chest pain secondary to other specified cause - please document in the progress notes  - Unable to clinically determine  Clinical Information: "Does have worsening anemia, hgb 6.4, more likely to be the etiology of his symptoms. Will transfuse one unit pRBC for likely symptomatic anemia." is documented in the ED provider note. "Chest pain - wu per admit/ ??anemia playing role in it Troponin 0.16 In esrd pt" documented in nephrology consult  "I do not suspect acute coronary syndrome, non-ST elevation myocardial infarction, patient severely anemic with demand ischemia." is documented in the cardiology consult    Query 2 of 2 Cause and effect relationships cannot be assumed and must be documented by the attending provider.  Please document the suspected or known cause of the patient's Gross Hematuria, including associated causes and/or conditions.  Clinical information: CT abdomen and pelvis 11/17/15 3.8 mm stone lies at the ureterovesicular junction of the transplant kidney causing at least mild transplant kidney hydronephrosis and moderate hydroureter as well as periureteral and perinephric stranding.   Please exercise your independent, professional judgment when responding. A specific answer is not anticipated or expected.   Thank You, Erling Conte  RN BSN [place] [phone]

## 2015-11-20 NOTE — Procedures (Signed)
I have personally attended this patient's dialysis session.  Access is cannulated, BFR up to 400. BP 120's. No heparinn Labs pending. Under EDW - will keep even to sl neg - 500 max (has been challenged at outpt center w/some chest discomfort). Will have somewhat lower EDW at discharge (97.5 kg).  Jamal Maes, MD [facility] [phone] Pager 11/20/2015, 7:35 AM

## 2015-11-20 NOTE — Progress Notes (Addendum)
[facility] Progress Note   Subjective: Seen in HD No bed available at [facility] as of yet No chest pain or SOB SR on tele   Filed Vitals:   11/19/15 2126 11/19/15 2325 11/20/15 0500 11/20/15 0700  BP: 132/71  134/66 142/84  Pulse: 88  87 88  Temp: 100.6 F (38.1 C) 99.9 F (37.7 C) 98.6 F (37 C) 98.3 F (36.8 C)  TempSrc: Oral Oral Oral Oral  Resp: 17  18 18   Height:      Weight:   97.705 kg (215 lb 6.4 oz) 97.8 kg (215 lb 9.8 oz)  SpO2: 98%  100%     Exam:[facility] AAM NAD Pre HD weight 97.8 kg (outpt EDW 98.5) HR 80's  Heart: Regular S1S2 No S3 Lungs: Anteriorly clear Abdomen: obese, Soft , NT, ND,  R lower quadrant renal allograft nontender Extremities: No edema Neuro: Alert, oriented, appropriate Dialysis Access: RUE access cannnulated   Inpatient medications: . aspirin EC  81 mg Oral Daily  . atorvastatin  10 mg Oral q1800  . calcium carbonate  1 tablet Oral Q breakfast  . carvedilol  25 mg Oral BID WC  . cefTRIAXone (ROCEPHIN)  IV  1 g Intravenous Q24H  . colchicine  0.3 mg Oral Daily  . darbepoetin (ARANESP) injection - DIALYSIS  200 mcg Intravenous Q Tue-HD  . insulin aspart  0-9 Units Subcutaneous TID WC  . isosorbide mononitrate  60 mg Oral Daily  . sodium chloride flush  3 mL Intravenous Q12H     acetaminophen **OR** acetaminophen, bisacodyl, gi cocktail, HYDROcodone-acetaminophen, ipratropium-albuterol, ondansetron **OR** ondansetron (ZOFRAN) IV, polyethylene glycol    Recent Labs Lab 11/17/15 1817 11/18/15 0617 11/19/15 0441  NA 136 137  136 136  K 3.4* 4.3  4.0 3.8  CL 98* 100*  99* 99*  CO2 24 23  24 27   GLUCOSE 94 97  98 108*  BUN 26* 14  14 25*  CREATININE 11.30* 7.31*  7.25* 10.35*  CALCIUM 7.0* 7.3*  7.3* 7.3*  PHOS  --  2.7  --     Recent Labs Lab 11/18/15 0617  ALBUMIN 2.7*    Recent Labs Lab 11/17/15 1817 11/18/15 0617 11/19/15 0441  WBC 5.9 6.2 6.1  HGB 7.4* 8.7* 8.2*  HCT 23.8* 28.5* 26.5*   MCV 87.8 89.1 89.8  PLT 189 194 188    Dialysis: [facility] on TTS . EDW 98.5 kg  HD Bath 2k, 2,5 ca  Time 4hrs  Heparin 1600.  Access RUA AVG  Hect 2 mcg IV/HD,  Mircera 120mcg q 2wks last on 11/13/15   CXR no edema, small effusions CT abd - transplant w mild hydro/ hydroureter, 3.8 mm stone at UVJ of transplanted kidney   Assessment/Plans: 1. ESRD / renal transplant (15 yrs esrd total, back on HD 2016) - TTS HD. HD today (no bed at [facility] yet - at least this will be out of the way in case of transfer today) EDW will be about 1 kg lower at discharge.Keeping even to neg 500 cc today 2. Anemia, severe - admission Hb 6.4. Multiple etiologies-ESRD/ hematuria with kidney stone.GI source not ruled out.  3 units transfused this admission (as best I can tell). Hb from this AM pre HD pending. 3. Chest pain - hx single vessel CABG (remote) CABG. Coronary Ca by CT. Abnormal stress test 2016.  Medical therapy until can prove no GI bleed and all other bleeding sources taken care of, including GU.  Nitrates added.  NSTEMI d/t demand ischemia from low Hb. (Dr. Einar Gip) 4. Hypertension/volume - BP stable. I think EDW will be about 1 kg lower at discharge 5. Anemia of ESRD- on max esa (I noted had no aranesp this admission - will get 200 mcg with HD today) 6. Metabolic bone disease withmild hypocalcemia as outpt (-ca corrected to 8.1) Noted not taking op PO calcitrol q day Use 2.5 Ca bath / tells me uses Velphora as binder per his report. Currently using oscal  7. DM type 2- per primary 8. Hematuria/ transplant kidney hydro/ UVJ stone renal tx ureter - symptom wise sounds like having a "ball-valve effect" (urine volume decreases to almost nothing, then increases with associated gross hematuria).  Planning transfer to  [facility] where his transplant was done. (Would not recommend transplant nephrostomy tube placement, IR also not comfortable with that and Urology had no other suggestions).  9. Pyuria -  urine culture 7000 colonies. On rocephin.   Jamal Maes, MD [facility] [phone] Pager 11/20/2015, 7:21 AM   Addendum: Hb 7.4 in HD. Trending back down. Will transfuse 1 unit PRBC's.  Jamal Maes, MD [facility] [phone] Pager 11/20/2015, 9:02 AM

## 2015-11-20 NOTE — Clinical Documentation Improvement (Deleted)
Hospitalist and/or Nephrology  Please document query responses in the progress notes and discharge summary, not on the CDI BPA form.  Thank you.  Patient presenting complaint was "Chest Pain".  Please document the Suspected, Likely or Known cause of the patient's chest pain, including any associated causes and/or conditions:  -   Clinical Information: "Does have worsening anemia, hgb 6.4, more likely to be the etiology of his symptoms" documented by ED provider, Forde Dandy, MD   Please exercise your independent, professional judgment when responding. A specific answer is not anticipated or expected.   Thank You, Rayle [place] [phone]

## 2015-11-20 NOTE — Plan of Care (Signed)
Problem: Education: Goal: Knowledge of [place] [facility] Education information/materials will improve Outcome: Progressing Patient aware of plan of care.  Patient has denied pain thus far this shift.  RN instructed patient to notify staff if he started to experience any pain.  Patient stated understanding.

## 2015-11-21 ENCOUNTER — Ambulatory Visit: Payer: Medicare Other | Admitting: Endocrinology

## 2015-11-21 DIAGNOSIS — T8612 Kidney transplant failure: Secondary | ICD-10-CM

## 2015-11-21 DIAGNOSIS — Z0289 Encounter for other administrative examinations: Secondary | ICD-10-CM

## 2015-11-21 DIAGNOSIS — I10 Essential (primary) hypertension: Secondary | ICD-10-CM

## 2015-11-21 DIAGNOSIS — R05 Cough: Secondary | ICD-10-CM

## 2015-11-21 DIAGNOSIS — N62 Hypertrophy of breast: Secondary | ICD-10-CM

## 2015-11-21 LAB — TYPE AND SCREEN
ABO/RH(D): O POS
Antibody Screen: NEGATIVE
UNIT DIVISION: 0
UNIT DIVISION: 0
Unit division: 0

## 2015-11-21 LAB — GLUCOSE, CAPILLARY
GLUCOSE-CAPILLARY: 112 mg/dL — AB (ref 65–99)
Glucose-Capillary: 80 mg/dL (ref 65–99)
Glucose-Capillary: 85 mg/dL (ref 65–99)
Glucose-Capillary: 96 mg/dL (ref 65–99)

## 2015-11-21 NOTE — Progress Notes (Signed)
Patient ID: Patrick [name], male   DOB: 1955-09-29, 60 y.o.   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 He had also been experiencing hematuria for the last 1 month. He was hospitalized for further management. Seen by cardiology and only medical management for now. Underwent dialysis with improvement in symptoms. Hematuria persists and he is waiting on transferred to [facility].  Assessment & Plan: Non-exertional chest pain/dyspnea/previous history of single-vessel bypass about 20 years ago - per cardiology, medical management alone. Patient has been started on nitrates.  - Dose of carvedilol has been increased to 25 mg PO BID. Continue aspirin and statin  - patient will need to be followed up as outpatient.  Gross hematuria/renal stone - CT scan with 8 mm stone at the ureterovesicular junction of the transplant kidney with mild transplant kidney hydronephrosis and moderate hydroureter as well as periureteral and perinephric stranding.  - discussed with transfer center [facility] bed availability and currently no beds available [phone]) - pt follows with Dr. Altamease Oiler and Dr. [name] hospitalist accepted the transfer  - if pt stable in AM and no further drops in Hg, may be able to discharge   Pyuria - on Rocephin   Normocytic anemia/acute blood loss anemia - progressive anemia secondary to ongoing gross hematuria - received 2 units of blood while he was in the emergency department - repeat CBC in AM  Cough - Recently treated for HCAP. No new infiltrates identified on chest x-ray. -  improving overall   ESRD/status post renal transplant 2009 - per nephrology   Essential hypertension - reasonably stable on Imdur 60 mg PO QD and Coreg 25 mg PO BID  Elevated TSH - No known history of hypothyroidism. This could be sick euthyroid. Free T4 is normal.  - No further workup at this time.  Diabetes mellitus type 2 - A1c was 5.4 two weeks ago. Continue to monitor CBGs.   Gynecomastia - outpatient follow up with endocrinology   Large RLQ ventral hernia - chronic   DVT prophylaxis: SCD's Code Status: Full Family Communication: Patient at bedside  Disposition Plan: Awaiting bed availability at [facility]  Consultants:   Nephrology   Procedures:   None   Antimicrobials:   None  Subjective: Denies chest pain or shortness of breath, frustrated that he is still waiting for transfer.  Objective: Filed Vitals:   11/20/15 1440 11/20/15 2137 11/21/15 0400 11/21/15 1323  BP: 136/82 133/75 138/67 133/79  Pulse: 88 81 95 92  Temp: 98.5 F (36.9 C) 99.3 F (37.4 C) 98.2 F (36.8 C) 98.3 F (36.8 C)  TempSrc: Oral Oral Oral Oral  Resp: 18  19 18   Height:      Weight:   96.389 kg (212 lb 8 oz)   SpO2: 98% 95% 98% 99%    Intake/Output Summary (Last 24 hours) at 11/21/15 1852 Last data filed at 11/21/15 1500  Gross per 24 hour  Intake    790 ml  Output    100 ml  Net    690 ml   Filed Weights   11/20/15 0700 11/20/15 1124 11/21/15 0400  Weight:  97.8 kg (215 lb 9.8 oz) 97.3 kg (214 lb 8.1 oz) 96.389 kg (212 lb 8 oz)    Examination:  General exam: Appears calm and comfortable  Respiratory system: Clear to auscultation. Respiratory effort normal. Cardiovascular system: S1 & S2 heard, RRR. No JVD, murmurs, rubs, gallops or clicks. No pedal edema. Gastrointestinal system: Abdomen is nondistended, soft and nontender.  Psychiatry: Judgement and insight appear normal. Mood & affect appropriate.   Data Reviewed: I have personally reviewed following labs and  imaging studies  CBC:  Recent Labs Lab 11/17/15 0759 11/17/15 1817 11/18/15 0617 11/19/15 0441 11/20/15 0806  WBC 5.0 5.9 6.2 6.1 6.0  HGB 6.4* 7.4* 8.7* 8.2* 7.4*  HCT 20.2* 23.8* 28.5* 26.5* 23.9*  MCV 89.8 87.8 89.1 89.8 89.2  PLT 169 189 194 188 [id number]   Basic Metabolic Panel:  Recent Labs Lab 11/17/15 0759 11/17/15 1817 11/18/15 0617 11/19/15 0441 11/20/15 0807  NA 136 136 137  136 136 134*  K 3.2* 3.4* 4.3  4.0 3.8 3.8  CL 97* 98* 100*  99* 99* 97*  CO2 24 24 23  24 27 22   GLUCOSE 93 94 97  98 108* 83  BUN 22* 26* 14  14 25* 34*  CREATININE 10.95* 11.30* 7.31*  7.25* 10.35* 13.53*  CALCIUM 7.1* 7.0* 7.3*  7.3* 7.3* 6.8*  PHOS  --   --  2.7  --  2.9   Liver Function Tests:  Recent Labs Lab 11/18/15 0617 11/20/15 0807  ALBUMIN 2.7* 2.5*   Coagulation Profile:  Recent Labs Lab 11/17/15 1502  INR 1.19   Cardiac Enzymes:  Recent Labs Lab 11/17/15 1526 11/17/15 1817  TROPONINI 0.12* 0.12*   CBG:  Recent Labs Lab 11/20/15 1605 11/20/15 2136 11/21/15 0720 11/21/15 1116 11/21/15 1621  GLUCAP 87 87 80 85 112*   Lipid Profile:  Recent Labs  11/19/15 0441  CHOL 147  HDL 22*  LDLCALC 79  TRIG 229*  CHOLHDL 6.7   Thyroid Function Tests:  Recent Labs  11/20/15 0806  TSH 9.512*  FREET4 0.91   Urine analysis:    Component Value Date/Time   COLORURINE RED* 11/17/2015 0910   APPEARANCEUR TURBID* 11/17/2015 0910   LABSPEC 1.035* 11/17/2015 0910   PHURINE 6.0 11/17/2015 0910   GLUCOSEU 100* 11/17/2015 0910   HGBUR LARGE* 11/17/2015 0910   BILIRUBINUR LARGE* 11/17/2015 0910   KETONESUR 40* 11/17/2015 0910   PROTEINUR >300* 11/17/2015 0910   UROBILINOGEN 0.2 11/24/2013 0242   NITRITE POSITIVE* 11/17/2015 0910   LEUKOCYTESUR LARGE* 11/17/2015 0910    Recent Results (from the past 240 hour(s))  Urine culture     Status: Abnormal   Collection Time: 11/18/15  6:20 AM  Result Value Ref Range Status   Specimen Description  URINE, RANDOM  Final   Special Requests NONE  Final   Culture 7,000 COLONIES/mL INSIGNIFICANT GROWTH (A)  Final   Report Status 11/19/2015 FINAL  Final  Culture, Urine     Status: None (Preliminary result)   Collection Time: 11/20/15  2:48 PM  Result Value Ref Range Status   Specimen Description URINE, RANDOM  Final   Special Requests NONE  Final   Culture TOO YOUNG TO READ  Final   Report Status PENDING  Incomplete      Radiology Studies: No results found.    Scheduled Meds: . sodium chloride   Intravenous Once  . sodium chloride   Intravenous Once  . aspirin EC  81 mg Oral Daily  .  atorvastatin  10 mg Oral q1800  . calcium carbonate  1 tablet Oral Q breakfast  . carvedilol  25 mg Oral BID WC  . cefTRIAXone (ROCEPHIN)  IV  1 g Intravenous Q24H  . colchicine  0.3 mg Oral Daily  . darbepoetin (ARANESP) injection - DIALYSIS  200 mcg Intravenous Q Tue-HD  . insulin aspart  0-9 Units Subcutaneous TID WC  . isosorbide mononitrate  60 mg Oral Daily  . sodium chloride flush  3 mL Intravenous Q12H   Continuous Infusions:    LOS: 4 days    Time spent: 20 minutes    Faye Ramsay, MD [facility] Pager [phone]  If 7PM-7AM, please contact night-coverage [email or web address] Password [facility] 11/21/2015, 6:52 PM

## 2015-11-21 NOTE — Progress Notes (Signed)
[facility] Progress Note   Subjective: Hoping for a bed at [facility] today Still having gross hematuria No CP or SOB  Had HD yesterday no issues -gave him a unit of blood for Hb of 7.4 No CBC today  Pre HD weight 97.8 kg Post HD weight 97.3  (outpt EDW 98.5)  Filed Vitals:   11/20/15 1250 11/20/15 1440 11/20/15 2137 11/21/15 0400  BP: 137/89 136/82 133/75 138/67  Pulse:  88 81 95  Temp:  98.5 F (36.9 C) 99.3 F (37.4 C) 98.2 F (36.8 C)  TempSrc:  Oral Oral Oral  Resp:  18  19  Height:      Weight:    96.389 kg (212 lb 8 oz)  SpO2:  98% 95% 98%    Exam:Pleasant AAM NAD Lying in bed Good spirits - just wants to "get this [facility] thing going" Heart: Regular S1S2 No S3 Lungs: Anteriorly clear Abdomen: obese, Soft , NT, ND,  R lower quadrant renal allograft nontender Extremities: No edema Neuro: Alert, oriented, appropriate Dialysis Access: RUE AVG + bruit   Inpatient medications: . sodium chloride   Intravenous Once  . sodium chloride   Intravenous Once  . aspirin EC  81 mg Oral Daily  . atorvastatin  10 mg Oral q1800  . calcium carbonate  1 tablet Oral Q breakfast  . carvedilol  25 mg Oral BID WC  . cefTRIAXone (ROCEPHIN)  IV  1 g Intravenous Q24H  . colchicine  0.3 mg Oral Daily  . darbepoetin (ARANESP) injection - DIALYSIS  200 mcg Intravenous Q Tue-HD  . insulin aspart  0-9 Units Subcutaneous TID WC  . isosorbide mononitrate  60 mg Oral Daily  . sodium chloride flush  3 mL Intravenous Q12H     acetaminophen **OR** acetaminophen, bisacodyl, gi cocktail, HYDROcodone-acetaminophen, ipratropium-albuterol, ondansetron **OR** ondansetron (ZOFRAN) IV, polyethylene glycol    Recent Labs Lab 11/18/15 0617 11/19/15 0441 11/20/15 0807  NA 137  136 136 134*  K 4.3  4.0 3.8 3.8  CL 100*  99* 99* 97*  CO2 23  24 27 22   GLUCOSE 97  98 108* 83  BUN 14  14 25* 34*  CREATININE 7.31*  7.25* 10.35* 13.53*  CALCIUM 7.3*  7.3* 7.3* 6.8*  PHOS  2.7  --  2.9    Recent Labs Lab 11/18/15 0617 11/20/15 0807  ALBUMIN 2.7* 2.5*    Recent Labs Lab 11/18/15 0617 11/19/15 0441 11/20/15 0806  WBC 6.2 6.1 6.0  HGB 8.7* 8.2* 7.4*  HCT 28.5* 26.5* 23.9*  MCV 89.1 89.8 89.2  PLT 194 188 185    Dialysis: [place] on TTS . EDW 98.5 kg  New EDW will be 97.5 kg HD Bath 2k, 2,5 ca  Time 4hrs  Heparin 1600.  Access RUA AVG  Hect 2 mcg IV/HD,  Mircera 123mcg q 2wks last on 11/13/15   CXR no edema, small effusions CT abd - transplant w mild hydro/ hydroureter, 3.8 mm stone at UVJ of transplanted kidney   Assessment/Plans: 1. 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 EDW will be about 1 kg lower at discharge. HD tomorrow 1st round (in case bed at [facility] not available today) 2. Anemia, severe - admission Hb 6.4. Multiple etiologies-ESRD/ hematuria with kidney stone.GI source not ruled out.  4 units transfused this admission - including 1 units with HD yesterday. If down trending tomorrow will give additional blood with HD 3. Chest pain -  hx single vessel CABG (remote) CABG. Coronary Ca by CT. Abnormal stress test 2016.  Medical therapy until can prove no GI bleed and all other bleeding sources taken care of, including GU.  Nitrates added. NSTEMI d/t demand ischemia from low Hb. (Dr. Einar Gip) 4. Hypertension/volume - BP stable. I think EDW will be about 1 kg lower at discharge 5. 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 Metabolic bone disease withmild hypocalcemia as outpt (-ca corrected to 8.1) Noted not taking op PO calcitrol q day Use 2.5 Ca bath / tells me uses Velphora as binder per his report. Currently using oscal  7. DM type 2- per primary 8. Hematuria/ transplant kidney hydro/ UVJ stone renal tx ureter - symptom wise sounds like having a "ball-valve effect" (urine volume decreases to almost nothing, then increases with associated  gross hematuria).  Planning transfer to  [facility] where his transplant was done. (Would not recommend transplant nephrostomy tube placement, IR also not comfortable with that and Urology had no other suggestions).  9. Pyuria - urine culture 7000 colonies. On rocephin.   Jamal Maes, MD [facility] [phone] Pager 11/21/2015, 12:31 PM

## 2015-11-21 NOTE — Clinical Documentation Improvement (Signed)
Hospitalist and/or Cardiology  Please document query responses in the progress notes and discharge summary, not on the CDI BPA form.   Thank you.  Please clarify the appropriate diagnosis for this patient.  The attending physician is required to clarify conflicting documentation in the medical record.  The following documentation is noted in the MEDICAL RECORD NUMBER Diagnosis 1:  "NSTEMI d/t demand ischemia from low Hb."        Documented by: Dr. Lorrene Reid           Location: Progress Notes 11/19/15, 11/20/15 and 11/21/15  Diagnosis 2: "Non-exertional chest pain/dyspnea/previous history of single-vessel bypass about 20 years ago"        Documented by: Attending Hospitalist         Location: H&P and subsequent progress notes  Diagnosis 3: "I do not suspect acute coronary syndrome, non-ST elevation myocardial infarction, patient severely anemic with demand ischemia."  Documented by:  Dr. Einar Gip consult 11/18/15 Location:  Cardiology consult  Diagnosis 4:  "Atypical Chest pain probably angina pectoris, chronic stable angina. H/O abnormal stress test in June 2016 (see above). Severe coronary calcification by CT scan." Documented by Dr. Einar Gip Location:  Progress Note 11/19/15            Please exercise your independent, professional judgment when responding. A specific answer is not anticipated or expected.   Thank You, Erling Conte  RN BSN CCDS [phone] Health Information Management [place]

## 2015-11-21 NOTE — Care Management Important Message (Signed)
Important Message  Patient Details  Name: Patrick [name] MRN: [id number] Date of Birth: [birth year]   Medicare Important Message Given:  Yes    Nathen May 11/21/2015, 10:56 AM

## 2015-11-21 NOTE — Care Management Note (Addendum)
Case Management Note  Patient Details  Name: Patrick [name] MRN: [id number] Date of Birth: 1956/04/25  Subjective/Objective:      Pt admitted for chest pain with increased troponin. Cardiology did consult and plan for medical management. Nephrology consulting- Hx failed renal transplant and now on HD TTHS Schedule. Pt with hematuria x 1 month. Awaiting transfer to [facility].              Action/Plan: CM did place call to physician advisor in regards to transfer. [facility] is on Diversion. CM did discuss with attending and pt will ave HD on 4-27 monitor HGB to see if needs blood. Attending will discuss with renal the plan of care. CM will continue to monitor.   Expected Discharge Date:                  Expected Discharge Plan:  Acute to Acute Transfer  In-House Referral:  NA  Discharge planning Services  CM Consult  Post Acute Care Choice:   N/A Choice offered to:   N/A  DME Arranged:   N/A DME Agency:   N/A  HH Arranged:   N/A HH Agency:    N/A Status of Service: Completed Medicare Important Message Given:  Yes Date Medicare IM Given:    Medicare IM give by:    Date Additional Medicare IM Given:    Additional Medicare Important Message give by:     If discussed at [place] of Stay Meetings, dates discussed:    Additional Comments: Pt has a bed available at [facility]. [facility] to transport.  Bethena Roys, RN 11/21/2015, 3:52 PM

## 2015-11-22 ENCOUNTER — Telehealth: Payer: Self-pay | Admitting: Endocrinology

## 2015-11-22 DIAGNOSIS — R079 Chest pain, unspecified: Secondary | ICD-10-CM

## 2015-11-22 LAB — CBC
HCT: 27.4 % — ABNORMAL LOW (ref 39.0–52.0)
Hemoglobin: 8.5 g/dL — ABNORMAL LOW (ref 13.0–17.0)
MCH: 27.2 pg (ref 26.0–34.0)
MCHC: 31 g/dL (ref 30.0–36.0)
MCV: 87.8 fL (ref 78.0–100.0)
PLATELETS: 191 10*3/uL (ref 150–400)
RBC: 3.12 MIL/uL — AB (ref 4.22–5.81)
RDW: 17.1 % — AB (ref 11.5–15.5)
WBC: 5.1 10*3/uL (ref 4.0–10.5)

## 2015-11-22 LAB — RENAL FUNCTION PANEL
ALBUMIN: 2.5 g/dL — AB (ref 3.5–5.0)
Anion gap: 11 (ref 5–15)
BUN: 24 mg/dL — AB (ref 6–20)
CALCIUM: 7 mg/dL — AB (ref 8.9–10.3)
CHLORIDE: 97 mmol/L — AB (ref 101–111)
CO2: 28 mmol/L (ref 22–32)
CREATININE: 11.16 mg/dL — AB (ref 0.61–1.24)
GFR calc Af Amer: 5 mL/min — ABNORMAL LOW (ref >60)
GFR, EST NON AFRICAN AMERICAN: 4 mL/min — AB (ref >60)
Glucose, Bld: 90 mg/dL (ref 65–99)
Phosphorus: 3.1 mg/dL (ref 2.5–4.6)
Potassium: 3.7 mmol/L (ref 3.5–5.1)
SODIUM: 136 mmol/L (ref 135–145)

## 2015-11-22 LAB — GLUCOSE, CAPILLARY
GLUCOSE-CAPILLARY: 117 mg/dL — AB (ref 65–99)
GLUCOSE-CAPILLARY: 93 mg/dL (ref 65–99)
Glucose-Capillary: 109 mg/dL — ABNORMAL HIGH (ref 65–99)

## 2015-11-22 LAB — URINE CULTURE

## 2015-11-22 NOTE — Telephone Encounter (Signed)
No follow up necessary (in hospital)

## 2015-11-22 NOTE — Progress Notes (Signed)
Patient ID: Patrick [name], male   DOB: May 02, 1956, 60 y.o.   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 He had also been experiencing hematuria for the last 1 month. He was hospitalized for further management. Seen by cardiology and only medical management for now. Underwent dialysis with improvement in symptoms. Hematuria persists and he is waiting on transferred to [facility].  Assessment & Plan: Atypical Chest pain probably angina pectoris, chronic stable angina. - H/O abnormal stress test in June 2016 (see above). Severe coronary calcification by CT scan. - demand ischemia from anemia - per cardiology, recommend medical therapy only for now given multiple medical comorbidities.  - Dose of carvedilol has been increased to 25 mg PO BID. Continue aspirin and statin  - patient will need to be followed up as outpatient.  Gross hematuria/renal stone - CT scan with 8 mm stone at the ureterovesicular junction of the transplant kidney with mild transplant kidney hydronephrosis and moderate hydroureter as well as periureteral and perinephric stranding.  - discussed with transfer center [facility] bed availability and currently no beds available [phone]) - pt follows with Dr. Altamease Oiler and Dr. [name] hospitalist accepted the transfer  - pt still with significant hematuria and reports not feeling good with these episodes  - will continue to monitor inpatient, repeat CBC In AM  Pyuria - on Rocephin, continue for now  Normocytic anemia/acute blood loss anemia - progressive anemia secondary to  ongoing gross hematuria - received 2 units of blood while he was in the emergency department - Hg stable this AM but pt had episode of hematuria this afternoon after HD - will continue to monitor - CBC In AM  Cough - Recently treated for HCAP. No new infiltrates identified on chest x-ray. - improving overall   ESRD/status post renal transplant 2009 - per nephrology  - tolerated HD well this AM - new EDW 97.5 kg   Essential hypertension - reasonably stable on Imdur 60 mg PO QD and Coreg 25 mg PO BID  Elevated TSH - No known history of hypothyroidism. This could be sick euthyroid. Free T4 is normal.  - No further workup at this time.  Diabetes mellitus type 2 - A1c was 5.4 two weeks ago. Continue to monitor CBGs.   Gynecomastia - outpatient follow up with endocrinology   Large RLQ ventral hernia - chronic   DVT prophylaxis: SCD's Code Status: Full Family Communication: Patient at bedside  Disposition Plan: Awaiting bed availability at [facility]  Consultants:   Nephrology   Procedures:   None   Antimicrobials:   None  Subjective: Denies chest pain or shortness of breath, frustrated that he is still waiting for transfer. More hematuria.this AM.  Objective: Filed Vitals:   11/22/15 1000 11/22/15 1030 11/22/15 1100 11/22/15 1130  BP: 154/87 150/89 156/108 157/89  Pulse: 81 82 85 94  Temp:    98.4 F (36.9 C)  TempSrc:    Oral  Resp:    22  Height:      Weight:    96.4 kg (212 lb 8.4 oz)  SpO2:    98%  Intake/Output Summary (Last 24 hours) at 11/22/15 1302 Last data filed at 11/22/15 1130  Gross per 24 hour  Intake    603 ml  Output    489 ml  Net    114 ml   Filed Weights   11/22/15 0500 11/22/15 0713 11/22/15 1130  Weight: 96.843 kg (213 lb 8 oz) 97.2 kg (214 lb 4.6 oz) 96.4 kg (212 lb 8.4 oz)    Examination:  General exam: Appears calm and comfortable  Respiratory system: Clear to auscultation. Respiratory effort normal. Cardiovascular system:  S1 & S2 heard, RRR. No JVD, murmurs, rubs, gallops or clicks. No pedal edema. Gastrointestinal system: Abdomen is nondistended, soft and nontender.  Psychiatry: Judgement and insight appear normal. Mood & affect appropriate.   Data Reviewed: I have personally reviewed following labs and imaging studies  CBC:  Recent Labs Lab 11/17/15 1817 11/18/15 0617 11/19/15 0441 11/20/15 0806 11/22/15 0530  WBC 5.9 6.2 6.1 6.0 5.1  HGB 7.4* 8.7* 8.2* 7.4* 8.5*  HCT 23.8* 28.5* 26.5* 23.9* 27.4*  MCV 87.8 89.1 89.8 89.2 87.8  PLT 189 194 188 185 [id number]   Basic Metabolic Panel:  Recent Labs Lab 11/17/15 1817 11/18/15 0617 11/19/15 0441 11/20/15 0807 11/22/15 0530  NA 136 137  136 136 134* 136  K 3.4* 4.3  4.0 3.8 3.8 3.7  CL 98* 100*  99* 99* 97* 97*  CO2 24 23  24 27 22 28   GLUCOSE 94 97  98 108* 83 90  BUN 26* 14  14 25* 34* 24*  CREATININE 11.30* 7.31*  7.25* 10.35* 13.53* 11.16*  CALCIUM 7.0* 7.3*  7.3* 7.3* 6.8* 7.0*  PHOS  --  2.7  --  2.9 3.1   Liver Function Tests:  Recent Labs Lab 11/18/15 0617 11/20/15 0807 11/22/15 0530  ALBUMIN 2.7* 2.5* 2.5*   Coagulation Profile:  Recent Labs Lab 11/17/15 1502  INR 1.19   Cardiac Enzymes:  Recent Labs Lab 11/17/15 1526 11/17/15 1817  TROPONINI 0.12* 0.12*   CBG:  Recent Labs Lab 11/21/15 0720 11/21/15 1116 11/21/15 1621 11/21/15 2014 11/22/15 1219  GLUCAP 80 85 112* 96 117*   Thyroid Function Tests:  Recent Labs  11/20/15 0806  TSH 9.512*  FREET4 0.91   Urine analysis:    Component Value Date/Time   COLORURINE RED* 11/17/2015 0910   APPEARANCEUR TURBID* 11/17/2015 0910   LABSPEC 1.035* 11/17/2015 0910   PHURINE 6.0 11/17/2015 0910   GLUCOSEU 100* 11/17/2015 0910   HGBUR LARGE* 11/17/2015 0910   BILIRUBINUR LARGE* 11/17/2015 0910   KETONESUR 40* 11/17/2015 0910   PROTEINUR >300* 11/17/2015 0910   UROBILINOGEN 0.2 11/24/2013 0242   NITRITE POSITIVE* 11/17/2015 0910   LEUKOCYTESUR  LARGE* 11/17/2015 0910    Recent Results (from the past 240 hour(s))  Urine culture     Status: Abnormal   Collection Time: 11/18/15  6:20 AM  Result Value Ref Range Status   Specimen Description URINE, RANDOM  Final   Special Requests NONE  Final   Culture 7,000 COLONIES/mL INSIGNIFICANT GROWTH (A)  Final   Report Status 11/19/2015 FINAL  Final  Culture, Urine     Status: Abnormal   Collection Time: 11/20/15  2:48 PM  Result Value Ref Range Status   Specimen Description URINE, RANDOM  Final   Special Requests NONE  Final   Culture MULTIPLE SPECIES PRESENT, SUGGEST RECOLLECTION (A)  Final   Report Status 11/22/2015 FINAL  Final     Radiology Studies: No results found.  Scheduled Meds: . sodium chloride   Intravenous Once  . sodium chloride   Intravenous Once  . aspirin EC  81 mg Oral Daily  . atorvastatin  10 mg Oral q1800  . calcium carbonate  1 tablet Oral Q breakfast  . carvedilol  25 mg Oral BID WC  . cefTRIAXone (ROCEPHIN)  IV  1 g Intravenous Q24H  . colchicine  0.3 mg Oral Daily  . darbepoetin (ARANESP) injection - DIALYSIS  200 mcg Intravenous Q Tue-HD  . insulin aspart  0-9 Units Subcutaneous TID WC  . isosorbide mononitrate  60 mg Oral Daily  . sodium chloride flush  3 mL Intravenous Q12H     LOS: 5 days   Time spent: 20 minutes   Faye Ramsay, MD [facility] Pager [phone]  If 7PM-7AM, please contact night-coverage [email or web address] Password [email or web address] 11/22/2015, 1:02 PM

## 2015-11-22 NOTE — Procedures (Signed)
I have personally attended this patient's dialysis session.   No heparin UF goal 0.5 liters (under EDW, normal BP, no edema) 4K bath (K 3.7)  Jamal Maes, MD [facility] [phone] Pager 11/22/2015, 8:42 AM

## 2015-11-22 NOTE — Progress Notes (Signed)
[facility] Progress Note   Subjective: Still hoping for a bed at [facility] having gross hematuria No CP or SOB Seen in HD Last transfused 4/25 with HD  Wants to know why on renal diet (eats regular diet at home and is careful about food choices) - told him I wasn't aware that he wanted regular diet. OK with me.  Filed Vitals:   11/22/15 0500 11/22/15 0713 11/22/15 0730 11/22/15 0800  BP: 130/66 127/84 137/80 146/81  Pulse: 93 88 88 86  Temp: 100 F (37.8 C) 98.9 F (37.2 C)    TempSrc: Oral Oral    Resp:  16    Height:      Weight: 96.843 kg (213 lb 8 oz) 97.2 kg (214 lb 4.6 oz)    SpO2: 97% 98%      Exam:[facility] A[facility] NAD Lying in bed - in the HD unit Heart: Regular S1S2 No S3 Lungs: Anteriorly clear Abdomen: obese, Soft , NT, ND,  R lower quadrant renal allograft nontender Extremities: No edema Neuro: Alert, oriented, appropriate Dialysis Access: RUE AVG + bruit - cannulated for HD   Inpatient medications: . sodium chloride   Intravenous Once  . sodium chloride   Intravenous Once  . aspirin EC  81 mg Oral Daily  . atorvastatin  10 mg Oral q1800  . calcium carbonate  1 tablet Oral Q breakfast  . carvedilol  25 mg Oral BID WC  . cefTRIAXone (ROCEPHIN)  IV  1 g Intravenous Q24H  . colchicine  0.3 mg Oral Daily  . darbepoetin (ARANESP) injection - DIALYSIS  200 mcg Intravenous Q Tue-HD  . insulin aspart  0-9 Units Subcutaneous TID WC  . isosorbide mononitrate  60 mg Oral Daily  . sodium chloride flush  3 mL Intravenous Q12H     acetaminophen **OR** acetaminophen, bisacodyl, gi cocktail, HYDROcodone-acetaminophen, ipratropium-albuterol, ondansetron **OR** ondansetron (ZOFRAN) IV, polyethylene glycol    Recent Labs Lab 11/18/15 0617 11/19/15 0441 11/20/15 0807 11/22/15 0530  NA 137  136 136 134* 136  K 4.3  4.0 3.8 3.8 3.7  CL 100*  99* 99* 97* 97*  CO2 23  24 27 22 28   GLUCOSE 97  98 108* 83 90  BUN 14  14 25* 34* 24*   CREATININE 7.31*  7.25* 10.35* 13.53* 11.16*  CALCIUM 7.3*  7.3* 7.3* 6.8* 7.0*  PHOS 2.7  --  2.9 3.1    Recent Labs Lab 11/18/15 0617 11/20/15 0807 11/22/15 0530  ALBUMIN 2.7* 2.5* 2.5*    Recent Labs Lab 11/19/15 0441 11/20/15 0806 11/22/15 0530  WBC 6.1 6.0 5.1  HGB 8.2* 7.4* 8.5*  HCT 26.5* 23.9* 27.4*  MCV 89.8 89.2 87.8  PLT 188 185 191    Dialysis: [facility] on TTS . EDW 98.5 kg  New EDW will be 97.5 kg HD Bath 2k, 2,5 ca  Time 4hrs  Heparin 1600.  Access RUA AVG  Hect 2 mcg IV/HD,  Mircera 129mcg q 2wks last on 11/13/15   CXR no edema, small effusions CT abd - transplant w mild hydro/ hydroureter, 3.8 mm stone at UVJ of transplanted kidney   Assessment/Plans:  1. 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 EDW will be about 1 kg lower at discharge. HD today 2. Anemia, severe - admission Hb 6.4. Multiple etiologies-ESRD/ hematuria with UVJ  stone.GI source not ruled out.  4 units transfused this admission - including 1 unit with  HD 4/25. Hb today 8.5 from 7.4 prior to last transfusion. No need for blood with HD today.  3. Chest pain - hx single vessel CABG (remote) CABG. Coronary Ca by CT. Abnormal stress test 2016.  Medical therapy until all  bleeding sources taken care of, including GU.  Nitrates added. NSTEMI d/t demand ischemia from low Hb. (Dr. Einar Gip) 4. Hypertension/volume - BP stable. I think EDW will be about 1 kg lower at discharge 5. 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 Metabolic bone disease withmild hypocalcemia as outpt (-ca corrected to 8.1) Noted not taking op PO calcitrol q day Use 2.5 Ca bath .Currently using oscal  7. DM type 2- per primary 8. Hematuria/ transplant kidney hydro/ UVJ stone renal tx ureter - symptom wise sounds like having a "ball-valve effect" (urine volume decreases to almost nothing, then increases with associated  gross hematuria).  Planning transfer to  [facility] where his transplant was done. (Would not recommend transplant nephrostomy tube placement, IR also not comfortable with that and Urology had no other suggestions).  9. Pyuria - urine culture 7000 colonies. On rocephin. Today is Day 4. Could probably be stopped. 10. Disposition - pending [facility] transfer  Jamal Maes, MD [facility] [phone] Pager 11/22/2015, 8:33 AM

## 2015-11-22 NOTE — Telephone Encounter (Signed)
Patient no showed today's appt. Please advise on how to follow up. °A. No follow up necessary. °B. Follow up urgent. Contact patient immediately. °C. Follow up necessary. Contact patient and schedule visit in ___ days. °D. Follow up advised. Contact patient and schedule visit in ____weeks. ° °

## 2015-11-22 NOTE — Plan of Care (Signed)
Problem: Education: Goal: Knowledge of [facility] Education information/materials will improve Outcome: Completed/Met Date Met:  11/22/15 Pt educated throughout entire admission regarding medications, tests, procedures, [place] values, pain ratings, and available resources  Problem: Safety: Goal: Ability to remain free from injury will improve Outcome: Completed/Met Date Met:  11/22/15 Pt remains free from falls during this admission   Problem: Health Behavior/Discharge Planning: Goal: Ability to manage health-related needs will improve Outcome: Completed/Met Date Met:  11/22/15 Pt states ability to manage health related needs  Problem: Physical Regulation: Goal: Will remain free from infection Outcome: Completed/Met Date Met:  11/22/15 Pt has remained free from any new type of infection, pt was positive for a UTI on admission   Problem: Activity: Goal: Risk for activity intolerance will decrease Outcome: Completed/Met Date Met:  11/22/15 Pt able to tolerate activity   Problem: Fluid Volume: Goal: Ability to maintain a balanced intake and output will improve Outcome: Completed/Met Date Met:  11/22/15 Pt has adequate intake and output at this time   Problem: Nutrition: Goal: Adequate nutrition will be maintained Outcome: Completed/Met Date Met:  11/22/15 Pt maintains adequate nutrition

## 2015-11-23 ENCOUNTER — Inpatient Hospital Stay (HOSPITAL_COMMUNITY): Payer: Medicare Other

## 2015-11-23 DIAGNOSIS — T8612 Kidney transplant failure: Secondary | ICD-10-CM | POA: Diagnosis present

## 2015-11-23 DIAGNOSIS — K439 Ventral hernia without obstruction or gangrene: Secondary | ICD-10-CM | POA: Diagnosis not present

## 2015-11-23 DIAGNOSIS — D899 Disorder involving the immune mechanism, unspecified: Secondary | ICD-10-CM | POA: Diagnosis not present

## 2015-11-23 DIAGNOSIS — E875 Hyperkalemia: Secondary | ICD-10-CM | POA: Diagnosis not present

## 2015-11-23 DIAGNOSIS — N200 Calculus of kidney: Secondary | ICD-10-CM | POA: Diagnosis not present

## 2015-11-23 DIAGNOSIS — K432 Incisional hernia without obstruction or gangrene: Secondary | ICD-10-CM | POA: Diagnosis present

## 2015-11-23 DIAGNOSIS — R262 Difficulty in walking, not elsewhere classified: Secondary | ICD-10-CM | POA: Diagnosis not present

## 2015-11-23 DIAGNOSIS — G8918 Other acute postprocedural pain: Secondary | ICD-10-CM | POA: Diagnosis not present

## 2015-11-23 DIAGNOSIS — E1122 Type 2 diabetes mellitus with diabetic chronic kidney disease: Secondary | ICD-10-CM | POA: Diagnosis present

## 2015-11-23 DIAGNOSIS — I120 Hypertensive chronic kidney disease with stage 5 chronic kidney disease or end stage renal disease: Secondary | ICD-10-CM | POA: Diagnosis present

## 2015-11-23 DIAGNOSIS — T8611 Kidney transplant rejection: Secondary | ICD-10-CM | POA: Diagnosis not present

## 2015-11-23 DIAGNOSIS — D638 Anemia in other chronic diseases classified elsewhere: Secondary | ICD-10-CM | POA: Diagnosis present

## 2015-11-23 DIAGNOSIS — N1330 Unspecified hydronephrosis: Secondary | ICD-10-CM | POA: Diagnosis not present

## 2015-11-23 DIAGNOSIS — R0602 Shortness of breath: Secondary | ICD-10-CM | POA: Diagnosis not present

## 2015-11-23 DIAGNOSIS — T8610 Unspecified complication of kidney transplant: Secondary | ICD-10-CM | POA: Diagnosis not present

## 2015-11-23 DIAGNOSIS — N1339 Other hydronephrosis: Secondary | ICD-10-CM | POA: Diagnosis not present

## 2015-11-23 DIAGNOSIS — Z436 Encounter for attention to other artificial openings of urinary tract: Secondary | ICD-10-CM | POA: Diagnosis not present

## 2015-11-23 DIAGNOSIS — Z940 Kidney transplant status: Secondary | ICD-10-CM | POA: Diagnosis not present

## 2015-11-23 DIAGNOSIS — N111 Chronic obstructive pyelonephritis: Secondary | ICD-10-CM | POA: Diagnosis present

## 2015-11-23 DIAGNOSIS — Z8719 Personal history of other diseases of the digestive system: Secondary | ICD-10-CM | POA: Diagnosis not present

## 2015-11-23 DIAGNOSIS — Z992 Dependence on renal dialysis: Secondary | ICD-10-CM | POA: Diagnosis not present

## 2015-11-23 DIAGNOSIS — Z905 Acquired absence of kidney: Secondary | ICD-10-CM | POA: Diagnosis not present

## 2015-11-23 DIAGNOSIS — Z9889 Other specified postprocedural states: Secondary | ICD-10-CM | POA: Diagnosis not present

## 2015-11-23 DIAGNOSIS — Z951 Presence of aortocoronary bypass graft: Secondary | ICD-10-CM | POA: Diagnosis not present

## 2015-11-23 DIAGNOSIS — I2510 Atherosclerotic heart disease of native coronary artery without angina pectoris: Secondary | ICD-10-CM | POA: Diagnosis present

## 2015-11-23 DIAGNOSIS — D62 Acute posthemorrhagic anemia: Secondary | ICD-10-CM | POA: Diagnosis present

## 2015-11-23 DIAGNOSIS — R319 Hematuria, unspecified: Secondary | ICD-10-CM | POA: Diagnosis not present

## 2015-11-23 DIAGNOSIS — N10 Acute tubulo-interstitial nephritis: Secondary | ICD-10-CM | POA: Diagnosis not present

## 2015-11-23 DIAGNOSIS — T8619 Other complication of kidney transplant: Secondary | ICD-10-CM | POA: Diagnosis present

## 2015-11-23 DIAGNOSIS — Z794 Long term (current) use of insulin: Secondary | ICD-10-CM | POA: Diagnosis not present

## 2015-11-23 DIAGNOSIS — N186 End stage renal disease: Secondary | ICD-10-CM | POA: Diagnosis present

## 2015-11-23 DIAGNOSIS — R310 Gross hematuria: Secondary | ICD-10-CM | POA: Diagnosis present

## 2015-11-23 DIAGNOSIS — D649 Anemia, unspecified: Secondary | ICD-10-CM | POA: Diagnosis not present

## 2015-11-23 DIAGNOSIS — Z538 Procedure and treatment not carried out for other reasons: Secondary | ICD-10-CM | POA: Diagnosis not present

## 2015-11-23 LAB — CBC
HCT: 27.7 % — ABNORMAL LOW (ref 39.0–52.0)
HEMOGLOBIN: 8.3 g/dL — AB (ref 13.0–17.0)
MCH: 26.9 pg (ref 26.0–34.0)
MCHC: 30 g/dL (ref 30.0–36.0)
MCV: 89.6 fL (ref 78.0–100.0)
Platelets: 183 10*3/uL (ref 150–400)
RBC: 3.09 MIL/uL — ABNORMAL LOW (ref 4.22–5.81)
RDW: 17.2 % — AB (ref 11.5–15.5)
WBC: 4.8 10*3/uL (ref 4.0–10.5)

## 2015-11-23 LAB — RENAL FUNCTION PANEL
ANION GAP: 12 (ref 5–15)
Albumin: 2.4 g/dL — ABNORMAL LOW (ref 3.5–5.0)
BUN: 14 mg/dL (ref 6–20)
CO2: 28 mmol/L (ref 22–32)
Calcium: 7.1 mg/dL — ABNORMAL LOW (ref 8.9–10.3)
Chloride: 97 mmol/L — ABNORMAL LOW (ref 101–111)
Creatinine, Ser: 7.68 mg/dL — ABNORMAL HIGH (ref 0.61–1.24)
GFR calc Af Amer: 8 mL/min — ABNORMAL LOW (ref >60)
GFR calc non Af Amer: 7 mL/min — ABNORMAL LOW (ref >60)
Glucose, Bld: 94 mg/dL (ref 65–99)
POTASSIUM: 4 mmol/L (ref 3.5–5.1)
Phosphorus: 3.1 mg/dL (ref 2.5–4.6)
SODIUM: 137 mmol/L (ref 135–145)

## 2015-11-23 LAB — GLUCOSE, CAPILLARY
GLUCOSE-CAPILLARY: 89 mg/dL (ref 65–99)
GLUCOSE-CAPILLARY: 92 mg/dL (ref 65–99)

## 2015-11-23 MED ORDER — COLCHICINE 0.6 MG PO TABS: 0.3000 mg | ORAL_TABLET | Freq: Every day | ORAL | Status: DC

## 2015-11-23 MED ORDER — ISOSORBIDE MONONITRATE ER 60 MG PO TB24: 60.0000 mg | ORAL_TABLET | Freq: Every day | ORAL | Status: DC

## 2015-11-23 MED ORDER — IPRATROPIUM-ALBUTEROL 0.5-2.5 (3) MG/3ML IN SOLN: 3.0000 mL | RESPIRATORY_TRACT | Status: DC | PRN

## 2015-11-23 MED ORDER — ATORVASTATIN CALCIUM 10 MG PO TABS: 10.0000 mg | ORAL_TABLET | Freq: Every day | ORAL | Status: DC

## 2015-11-23 NOTE — Progress Notes (Signed)
[facility] picked up pt for transport to [facility]  Report given to Paramedic Scott, all questions answered, and assessment unchanged from earlier.  Report called to Charm Rings, at [facility], all questions answered.

## 2015-11-23 NOTE — Progress Notes (Signed)
Patient ID: Patrick [name], male   DOB: 01/03/1956, 60 y.o.   MRN: [id number]  [facility] Progress Note   Assessment/ Plan:   1. ESRD / failed renal transplant (back on HD since 2016) - TTS HD. New EDW of 97.5kg. He does not have any acute dialysis needs at this time-next treatment scheduled for tomorrow 2. Anemia, with blood loss from hematuria with UVJ stone and background history of ESRD.GI source not ruled out. He has undergone 4 units PRBC transfusions this hospitalization.  3. Chest pain - hx single vessel CABG (remote) CABG. Likely with NSTEMI/demand ischemia from acute blood loss anemia based on evaluation by cardiology 4. Hypertension/volume - blood pressures intermittently elevated-ranges made to estimated dry weight 5. Secondary hyperparathyroidism: With marginally depressed calcium levels when corrected for albumin- on calcium based binder and calcitriol. 6. DM type 2- per primary 7. Hematuria/ transplant kidney hydro/ UVJ stone renal tx ureter - recommended by urology to have a nephrostomy tube placed however, given that this is to his renal allograft, seeking specialized care with transfer to [facility] for urology evaluation and management..  8. Disposition - pending [facility] transfer  Subjective:   Reports to be feeling fair-still having intermittent hematuria    Objective:   BP 144/90 mmHg  Pulse 94  Temp(Src) 99.8 F (37.7 C) (Oral)  Resp 18  Ht 5\' 6"  (1.676 m)  Wt 96.117 kg (211 lb 14.4 oz)  BMI 34.22 kg/m2  SpO2 100%  Physical Exam: [id number] resting in bed, speaking on his phone CVS: Pulse with regular rhythm, heart sounds S1 and S2 normal Resp: Bilaterally clear to auscultation, no rales Abd: Soft, obese, nontender Ext: No lower extremity edema, right upper arm arc AVG  Labs: BMET  Recent Labs Lab 11/17/15 0759 11/17/15 1817 11/18/15 0617 11/19/15 0441 11/20/15 0807 11/22/15 0530 11/23/15 0506  NA 136 136 137  136 136 134* 136 137   K 3.2* 3.4* 4.3  4.0 3.8 3.8 3.7 4.0  CL 97* 98* 100*  99* 99* 97* 97* 97*  CO2 24 24 23  24 27 22 28 28   GLUCOSE 93 94 97  98 108* 83 90 94  BUN 22* 26* 14  14 25* 34* 24* 14  CREATININE 10.95* 11.30* 7.31*  7.25* 10.35* 13.53* 11.16* 7.68*  CALCIUM 7.1* 7.0* 7.3*  7.3* 7.3* 6.8* 7.0* 7.1*  PHOS  --   --  2.7  --  2.9 3.1 3.1   CBC  Recent Labs Lab 11/19/15 0441 11/20/15 0806 11/22/15 0530 11/23/15 0506  WBC 6.1 6.0 5.1 4.8  HGB 8.2* 7.4* 8.5* 8.3*  HCT 26.5* 23.9* 27.4* 27.7*  MCV 89.8 89.2 87.8 89.6  PLT 188 185 191 183   Medications:    . sodium chloride   Intravenous Once  . sodium chloride   Intravenous Once  . aspirin EC  81 mg Oral Daily  . atorvastatin  10 mg Oral q1800  . calcium carbonate  1 tablet Oral Q breakfast  . carvedilol  25 mg Oral BID WC  . cefTRIAXone (ROCEPHIN)  IV  1 g Intravenous Q24H  . colchicine  0.3 mg Oral Daily  . darbepoetin (ARANESP) injection - DIALYSIS  200 mcg Intravenous Q Tue-HD  . insulin aspart  0-9 Units Subcutaneous TID WC  . isosorbide mononitrate  60 mg Oral Daily  . sodium chloride flush  3 mL Intravenous Q12H   Elmarie Shiley, MD 11/23/2015, 9:26 AM

## 2015-11-23 NOTE — Discharge Instructions (Signed)
End-Stage Kidney Disease The kidneys are two organs that lie on either side of the spine between the middle of the back and the front of the abdomen. The kidneys:   Remove wastes and extra water from the blood.   Produce important hormones. These help keep bones strong, regulate blood pressure, and help create red blood cells.   Balance the fluids and chemicals in the blood and tissues. End-stage kidney disease occurs when the kidneys are so damaged that they cannot do their job. When the kidneys cannot do their job, life-threatening problems occur. The body cannot stay clean and strong without the help of the kidneys. In end-stage kidney disease, the kidneys cannot get better.You need a new kidney or treatments to do some of the work healthy kidneys do in order to stay alive. CAUSES  End-stage kidney disease usually occurs when a long-lasting (chronic) kidney disease gets worse. It may also occur after the kidneys are suddenly damaged (acute kidney injury).  SYMPTOMS   Swelling (edema) of the legs, ankles, or feet.   Tiredness (lethargy).   Nausea or vomiting.   Confusion.   Problems with urination, such as:   Decreased urine production.   Frequent urination, especially at night.   Frequent accidents in children who are potty trained.   Muscle twitches and cramps.   Persistent itchiness.   Loss of appetite.   Headaches.   Abnormally dark or light skin.   Numbness in the hands or feet.   Easy bruising.   Frequent hiccups.   Menstruation stops. DIAGNOSIS  Your health care provider will measure your blood pressure and take some tests. These may include:   Urine tests.   Blood tests.   Imaging tests, such as:   An ultrasound exam.   Computed tomography (CT).  A kidney biopsy. TREATMENT  There are two treatments for end-stage kidney disease:   A procedure that removes toxic wastes from the body (dialysis).   Receiving a new kidney  (kidney transplant). Both of these treatments have serious risks and consequences. Your health care provider will help you determine which treatment is best for you based on your health, age, and other factors. In addition to having dialysis or a kidney transplant, you may need to take medicines to control high blood pressure (hypertension) and cholesterol and to decrease phosphorus levels in your blood.  HOME CARE INSTRUCTIONS  Follow your prescribed diet.   Take medicines only as directed by your health care provider.   Do not take any new medicines (prescription, over-the-counter, or nutritional supplements) unless approved by your health care provider. Many medicines can worsen your kidney damage or need to have the dose adjusted.   Keep all follow-up visits as directed by your health care provider. MAKE SURE YOU:  Understand these instructions.  Will watch your condition.  Will get help right away if you are not doing well or get worse.   This information is not intended to replace advice given to you by your health care provider. Make sure you discuss any questions you have with your health care provider.   Document Released: 10/04/2003 Document Revised: 08/04/2014 Document Reviewed: 03/12/2012 Elsevier Interactive Patient Education 2016 [employer]  

## 2015-11-23 NOTE — Progress Notes (Signed)
Spoke with nurse at [facility] for an update on pt condition.  She gave me the number for their transfer center that is able to answer any questions regarding available beds for pt and they would like to be notified of any major changes in pt condition.  Number for [facility] hospital transfer center is [phone]

## 2015-11-23 NOTE — Care Management Important Message (Signed)
Important Message  Patient Details  Name: Patrick [name] MRN: [id number] Date of Birth: 04/05/1956   Medicare Important Message Given:  Yes    Bethena Roys, RN 11/23/2015, 12:05 PM

## 2015-11-23 NOTE — Discharge Summary (Addendum)
Physician Discharge Summary  Patrick [name] [id number] DOB: Sep 08, 1955 DOA: 11/17/2015  PCP: Elyn Peers, MD  Admit date: 11/17/2015 Discharge date: 11/23/2015  Recommendations for Outpatient Follow-up:  1. 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 He had also been experiencing hematuria for the last 1 month. He was hospitalized for further management. Seen by cardiology and only medical management for now. Underwent dialysis with improvement in symptoms. Hematuria persists and he is waiting on transferred to [facility].  Assessment & Plan: Atypical Chest pain probably angina pectoris, chronic stable angina. - H/O abnormal stress test in June 2016 (see above). Severe coronary calcification by CT scan. - demand ischemia from anemia - per cardiology, recommend medical therapy only for now given multiple medical comorbidities.  - Dose of carvedilol has been increased to 25 mg PO BID. Continue aspirin and statin  - patient will need to be followed up as outpatient.  Fever overnight 11/22/2015 up to 101.6 F - CT chest with no sings of PNA, no fevers this AM - monitor   Gross hematuria/renal stone - CT scan with 8 mm stone at the ureterovesicular junction of the transplant kidney with mild transplant kidney hydronephrosis and moderate hydroureter as well as  periureteral and perinephric stranding.  - attending doctor discussed with transfer center at [facility] - pt follows with Dr. Altamease Oiler and Dr. [name] hospitalist accepted the transfer  - pt still with significant hematuria   Pyuria - on Rocephin, today is day #5, will stop after today's dose   Normocytic anemia/acute blood loss anemia - progressive anemia secondary to ongoing gross hematuria - received 2 units of blood while he was in the emergency department - Hg stable this AM, pt reports persistent hematuria   ESRD/status post renal transplant 2009 - per nephrology  - tolerated HD well this AM, next session due 11/24/2015 - new EDW 97.5 kg   Essential hypertension - reasonably stable on Imdur 60 mg PO QD and Coreg 25 mg PO BID  Elevated TSH - No known history of hypothyroidism. This could be sick euthyroid. Free T4 is normal.  - No further workup at this time.  Diabetes mellitus type 2 - A1c was 5.4 two weeks ago. Continue to monitor CBGs.   Gynecomastia - outpatient follow up with endocrinology   Large RLQ ventral hernia - chronic   DVT prophylaxis: SCD's Code Status: Full Family Communication: Patient at bedside  Disposition Plan: Transfer to [facility]  Consultants:   Nephrology   Procedures/Studies: Ct Abdomen Pelvis Wo Contrast  11/17/2015  CLINICAL DATA:  Low abdominal pain and hematuria beginning 1 day ago. History of a kidney transplant. EXAM: CT ABDOMEN AND PELVIS WITHOUT CONTRAST TECHNIQUE: Multidetector CT imaging of the abdomen and pelvis was performed following the standard protocol without IV contrast. COMPARISON:  None. FINDINGS: Lung bases: Heart normal in size. There are dense coronary  artery calcifications. Trace right pleural fluid. Lung base subsegmental atelectasis. Ill-defined nodular opacity the left lower lobe measures 6 mm. Smaller partly imaged nodular opacity lies right upper lobe of the liver fissure. No evidence of pneumonia or pulmonary edema. Liver,  spleen, gallbladder, pancreas, adrenal glands:  Unremarkable. Kidneys, ureters, bladder: Marked atrophy of the native kidneys with multiple bilateral cysts. No hydronephrosis. Normal native ureters. Right pelvic renal transplant shows perinephric stranding. The collecting system is not well-defined but appears least mildly dilated. The transplant ureter is dilated with periureteral stranding. A 3.8 mm stone projects at the renal transplant ureterovesicular junction. Bladder is mostly decompressed but otherwise unremarkable. Vascular: There are dense atherosclerotic calcifications along the abdominal aorta and its branch vessels. No aneurysm. Lymph nodes:  No pathologically enlarged lymph nodes. Ascites:  None. Gastrointestinal/ abdominal wall: Large right mid to lower quadrant hernia which contains small bowel without evidence of obstruction, strangulation or incarceration. There is a small fat containing umbilical hernia. There are scattered colonic diverticula. No diverticulitis. Appendix not visualized. Musculoskeletal:  No osteoblastic or osteolytic lesions. IMPRESSION: 1. 3.8 mm stone lies at the ureterovesicular junction of the transplant kidney causing at least mild transplant kidney hydronephrosis and moderate hydroureter as well as periureteral and perinephric stranding. 2. No other acute findings. Electronically Signed   By: Lajean Manes M.D.   On: 11/17/2015 11:50   Dg Chest 2 View  11/17/2015  CLINICAL DATA:  Mid chest pain began last night with pain radiating into left arm with numbness/tingling left hand and fingers. SOB x 1 month. Hx of HTN- on meds; diabetic. EXAM: CHEST  2 VIEW COMPARISON:  10/30/2015 FINDINGS: Status post median sternotomy. Surgical clips are noted in the superior mediastinum. Right stent graft again noted. The heart is enlarged. Bilateral pleural effusions are present. No focal consolidations or pulmonary edema. IMPRESSION: 1. Cardiomegaly. 2. Small bilateral pleural  effusions. Electronically Signed   By: Nolon Nations M.D.   On: 11/17/2015 08:45   Dg Chest 2 View  10/30/2015  CLINICAL DATA:  60 year old male with chest pain shortness of breath and gross hematuria for 1 week. Initial encounter. EXAM: CHEST  2 VIEW COMPARISON:  Chest radiographs 10/11/2015 hit and earlier. FINDINGS: Increased basilar opacity which on the lateral view appears to be pooling in the costophrenic angles compatible with small effusions. Stable cardiomegaly and mediastinal contours. No pneumothorax. No acute pulmonary edema. No other confluent pulmonary opacity. Stable peritracheal and mediastinal surgical clips and/or vascular stent material. No acute osseous abnormality identified. IMPRESSION: New small bilateral pleural effusions. Difficult to exclude associated mild or developing lower lobe infection. No other acute cardiopulmonary abnormality. Electronically Signed   By: Genevie Ann M.D.   On: 10/30/2015 09:20   Ct Chest Wo Contrast  11/23/2015  CLINICAL DATA:  Shortness of breath this morning, post dialysis yesterday EXAM: CT CHEST WITHOUT CONTRAST TECHNIQUE: Multidetector CT imaging of the chest was performed following the standard protocol without IV contrast. COMPARISON:  Chest x-ray 11/17/2015 FINDINGS: Mediastinum/Lymph Nodes: The patient is status post median sternotomy. Atherosclerotic calcifications of thoracic aorta. Borderline cardiomegaly. Atherosclerotic calcifications of coronary arteries. Mitral valve calcifications are noted. Small hiatal hernia. No pericardial effusion. Small nonspecific mediastinal lymph nodes. No mediastinal hematoma or adenopathy noted on this unenhanced scan. No hilar adenopathy. Images of the thoracic inlet are unremarkable. Central airways are patent. Lungs/Pleura: There is no pleural thickening or pleural effusion. Images of the lung parenchyma shows no acute infiltrate or pulmonary edema. Axial image 57 there is  6 mm nodule in right upper lobe  laterally. Mild atelectasis is or scarring noted in right lower lobe posteriorly. Upper abdomen: Markedly atrophic kidney partially visualized with multiple renal cysts. The visualized unenhanced liver shows no biliary ductal dilatation. Gallbladder is contracted without evidence of calcified gallstones. Punctate nonobstructive calcified calculus upper pole of the left kidney measures 3 mm. Extensive atherosclerotic calcifications of splenic artery. Visualized unenhanced pancreatic body and tail are unremarkable. No adrenal gland masses noted. Shin Musculoskeletal: Sagittal images of the spine shows mild degenerative changes mid and lower thoracic spine. IMPRESSION: 1. Small hiatal hernia is noted. Borderline cardiomegaly. Atherosclerotic calcifications of thoracic aorta and coronary arteries. Status post median sternotomy. 2. No mediastinal hematoma or adenopathy. 3. No acute infiltrate or pulmonary edema. There is 6 mm nodule in right upper lobe laterally. Non-contrast chest CT at 6-12 months is recommended. If the nodule is stable at time of repeat CT, then future CT at 18-24 months (from today's scan) is considered optional for low-risk patients, but is recommended for high-risk patients. This recommendation follows the consensus statement: Guidelines for Management of Incidental Pulmonary Nodules Detected on CT Images:From the Fleischner Society 2017; published online before print (10.1148/radiol.IJ:2314499). 4. Bilateral partially visualized renal atrophy and multiple renal cysts. Left nonobstructive nephrolithiasis. No adrenal gland mass is noted. Electronically Signed   By: Lahoma Crocker M.D.   On: 11/23/2015 12:20   Discharge Exam: Filed Vitals:   11/23/15 0717 11/23/15 1311  BP: 144/90 109/54  Pulse: 94 79  Temp: 99.8 F (37.7 C) 97.8 F (36.6 C)  Resp: 24    Filed Vitals:   11/22/15 2206 11/23/15 0346 11/23/15 0717 11/23/15 1311  BP:  138/88 144/90 109/54  Pulse:  87 94 79  Temp: 99.1 F  (37.3 C) 99.4 F (37.4 C) 99.8 F (37.7 C) 97.8 F (36.6 C)  TempSrc: Oral Oral Oral Oral  Resp:  18 24   Height:      Weight:  96.117 kg (211 lb 14.4 oz)    SpO2:  96% 100% 98%    General: Pt is alert, follows commands appropriately, not in acute distress Cardiovascular: Regular rate and rhythm, no rubs, no gallops Respiratory: Clear to auscultation bilaterally, no wheezing, no crackles, no rhonchi Abdominal: Soft, non tender, non distended, bowel sounds +, no guarding Extremities: no edema, no cyanosis, pulses palpable bilaterally DP and PT  Discharge Instructions  Discharge Instructions    Diet - low sodium heart healthy    Complete by:  As directed      Increase activity slowly    Complete by:  As directed             Medication List    STOP taking these medications        calcitRIOL 0.5 MCG capsule  Commonly known as:  ROCALTROL     repaglinide 0.5 MG tablet  Commonly known as:  PRANDIN      TAKE these medications        aspirin 81 MG tablet  Take 81 mg by mouth daily.     atorvastatin 10 MG tablet  Commonly known as:  LIPITOR  Take 1 tablet (10 mg total) by mouth daily at 6 PM.     calcium carbonate 1250 (500 Ca) MG tablet  Commonly known as:  OS-CAL - dosed in mg of elemental calcium  Take 1 tablet by mouth daily with breakfast.     carvedilol 25 MG tablet  Commonly known as:  COREG  Take 25 mg by mouth 2 (two) times daily with a meal.     colchicine 0.6 MG tablet  Take 0.5 tablets (0.3 mg total) by mouth daily.     ipratropium-albuterol 0.5-2.5 (3) MG/3ML Soln  Commonly known as:  DUONEB  Take 3 mLs by nebulization every 4 (four) hours as needed.     isosorbide mononitrate 60 MG 24 hr tablet  Commonly known as:  IMDUR  Take 1 tablet (60 mg total) by mouth daily.           Follow-up Information    Follow up with Elyn Peers, MD.   Specialty:  Family Medicine   Contact information:   [address] [place]  [phone]        The results of significant diagnostics from this hospitalization (including imaging, microbiology, ancillary and laboratory) are listed below for reference.     Microbiology: Recent Results (from the past 240 hour(s))  Urine culture     Status: Abnormal   Collection Time: 11/18/15  6:20 AM  Result Value Ref Range Status   Specimen Description URINE, RANDOM  Final   Special Requests NONE  Final   Culture 7,000 COLONIES/mL INSIGNIFICANT GROWTH (A)  Final   Report Status 11/19/2015 FINAL  Final  Culture, Urine     Status: Abnormal   Collection Time: 11/20/15  2:48 PM  Result Value Ref Range Status   Specimen Description URINE, RANDOM  Final   Special Requests NONE  Final   Culture MULTIPLE SPECIES PRESENT, SUGGEST RECOLLECTION (A)  Final   Report Status 11/22/2015 FINAL  Final     Labs: Basic Metabolic Panel:  Recent Labs Lab 11/18/15 0617 11/19/15 0441 11/20/15 0807 11/22/15 0530 11/23/15 0506  NA 137  136 136 134* 136 137  K 4.3  4.0 3.8 3.8 3.7 4.0  CL 100*  99* 99* 97* 97* 97*  CO2 23  24 27 22 28 28   GLUCOSE 97  98 108* 83 90 94  BUN 14  14 25* 34* 24* 14  CREATININE 7.31*  7.25* 10.35* 13.53* 11.16* 7.68*  CALCIUM 7.3*  7.3* 7.3* 6.8* 7.0* 7.1*  PHOS 2.7  --  2.9 3.1 3.1   Liver Function Tests:  Recent Labs Lab 11/18/15 0617 11/20/15 0807 11/22/15 0530 11/23/15 0506  ALBUMIN 2.7* 2.5* 2.5* 2.4*   CBC:  Recent Labs Lab 11/18/15 0617 11/19/15 0441 11/20/15 0806 11/22/15 0530 11/23/15 0506  WBC 6.2 6.1 6.0 5.1 4.8  HGB 8.7* 8.2* 7.4* 8.5* 8.3*  HCT 28.5* 26.5* 23.9* 27.4* 27.7*  MCV 89.1 89.8 89.2 87.8 89.6  PLT 194 188 185 191 183   Cardiac Enzymes:  Recent Labs Lab 11/17/15 1526 11/17/15 1817  TROPONINI 0.12* 0.12*   BNP: BNP (last 3 results)  Recent Labs  10/30/15 0855 11/17/15 0759  BNP 2633.8* 2608.9*   CBG:  Recent Labs Lab 11/22/15 1219 11/22/15 1612 11/22/15 2013 11/23/15 0729  11/23/15 1119  GLUCAP 117* 109* 93 89 92   SIGNED: Time coordinating discharge: 30 minutes  MAGICK-MYERS, ISKRA, MD  [facility] 11/23/2015, 1:19 PM Pager [phone]  If 7PM-7AM, please contact night-coverage [email or web address] Password [email or web address]

## 2015-11-25 DIAGNOSIS — Z992 Dependence on renal dialysis: Secondary | ICD-10-CM | POA: Diagnosis not present

## 2015-11-25 DIAGNOSIS — T8610 Unspecified complication of kidney transplant: Secondary | ICD-10-CM | POA: Diagnosis not present

## 2015-11-25 DIAGNOSIS — N186 End stage renal disease: Secondary | ICD-10-CM | POA: Diagnosis not present

## 2015-11-29 DIAGNOSIS — R262 Difficulty in walking, not elsewhere classified: Secondary | ICD-10-CM | POA: Insufficient documentation

## 2015-12-06 DIAGNOSIS — Z789 Other specified health status: Secondary | ICD-10-CM | POA: Insufficient documentation

## 2015-12-11 DIAGNOSIS — D509 Iron deficiency anemia, unspecified: Secondary | ICD-10-CM | POA: Diagnosis not present

## 2015-12-11 DIAGNOSIS — D631 Anemia in chronic kidney disease: Secondary | ICD-10-CM | POA: Diagnosis not present

## 2015-12-11 DIAGNOSIS — E1029 Type 1 diabetes mellitus with other diabetic kidney complication: Secondary | ICD-10-CM | POA: Diagnosis not present

## 2015-12-11 DIAGNOSIS — N186 End stage renal disease: Secondary | ICD-10-CM | POA: Diagnosis not present

## 2015-12-13 DIAGNOSIS — D509 Iron deficiency anemia, unspecified: Secondary | ICD-10-CM | POA: Diagnosis not present

## 2015-12-13 DIAGNOSIS — D631 Anemia in chronic kidney disease: Secondary | ICD-10-CM | POA: Diagnosis not present

## 2015-12-13 DIAGNOSIS — E1029 Type 1 diabetes mellitus with other diabetic kidney complication: Secondary | ICD-10-CM | POA: Diagnosis not present

## 2015-12-13 DIAGNOSIS — N186 End stage renal disease: Secondary | ICD-10-CM | POA: Diagnosis not present

## 2015-12-14 DIAGNOSIS — Z7982 Long term (current) use of aspirin: Secondary | ICD-10-CM | POA: Diagnosis not present

## 2015-12-14 DIAGNOSIS — Z8719 Personal history of other diseases of the digestive system: Secondary | ICD-10-CM | POA: Diagnosis not present

## 2015-12-14 DIAGNOSIS — Z905 Acquired absence of kidney: Secondary | ICD-10-CM | POA: Diagnosis not present

## 2015-12-14 DIAGNOSIS — Z79899 Other long term (current) drug therapy: Secondary | ICD-10-CM | POA: Diagnosis not present

## 2015-12-14 DIAGNOSIS — Z09 Encounter for follow-up examination after completed treatment for conditions other than malignant neoplasm: Secondary | ICD-10-CM | POA: Diagnosis not present

## 2015-12-15 DIAGNOSIS — N186 End stage renal disease: Secondary | ICD-10-CM | POA: Diagnosis not present

## 2015-12-15 DIAGNOSIS — D631 Anemia in chronic kidney disease: Secondary | ICD-10-CM | POA: Diagnosis not present

## 2015-12-15 DIAGNOSIS — E1029 Type 1 diabetes mellitus with other diabetic kidney complication: Secondary | ICD-10-CM | POA: Diagnosis not present

## 2015-12-15 DIAGNOSIS — D509 Iron deficiency anemia, unspecified: Secondary | ICD-10-CM | POA: Diagnosis not present

## 2015-12-18 DIAGNOSIS — N186 End stage renal disease: Secondary | ICD-10-CM | POA: Diagnosis not present

## 2015-12-18 DIAGNOSIS — D631 Anemia in chronic kidney disease: Secondary | ICD-10-CM | POA: Diagnosis not present

## 2015-12-18 DIAGNOSIS — D509 Iron deficiency anemia, unspecified: Secondary | ICD-10-CM | POA: Diagnosis not present

## 2015-12-18 DIAGNOSIS — E1029 Type 1 diabetes mellitus with other diabetic kidney complication: Secondary | ICD-10-CM | POA: Diagnosis not present

## 2015-12-20 DIAGNOSIS — D509 Iron deficiency anemia, unspecified: Secondary | ICD-10-CM | POA: Diagnosis not present

## 2015-12-20 DIAGNOSIS — D631 Anemia in chronic kidney disease: Secondary | ICD-10-CM | POA: Diagnosis not present

## 2015-12-20 DIAGNOSIS — N186 End stage renal disease: Secondary | ICD-10-CM | POA: Diagnosis not present

## 2015-12-20 DIAGNOSIS — E1029 Type 1 diabetes mellitus with other diabetic kidney complication: Secondary | ICD-10-CM | POA: Diagnosis not present

## 2015-12-22 DIAGNOSIS — D631 Anemia in chronic kidney disease: Secondary | ICD-10-CM | POA: Diagnosis not present

## 2015-12-22 DIAGNOSIS — E1029 Type 1 diabetes mellitus with other diabetic kidney complication: Secondary | ICD-10-CM | POA: Diagnosis not present

## 2015-12-22 DIAGNOSIS — N186 End stage renal disease: Secondary | ICD-10-CM | POA: Diagnosis not present

## 2015-12-22 DIAGNOSIS — D509 Iron deficiency anemia, unspecified: Secondary | ICD-10-CM | POA: Diagnosis not present

## 2015-12-25 DIAGNOSIS — N186 End stage renal disease: Secondary | ICD-10-CM | POA: Diagnosis not present

## 2015-12-25 DIAGNOSIS — D631 Anemia in chronic kidney disease: Secondary | ICD-10-CM | POA: Diagnosis not present

## 2015-12-25 DIAGNOSIS — D509 Iron deficiency anemia, unspecified: Secondary | ICD-10-CM | POA: Diagnosis not present

## 2015-12-25 DIAGNOSIS — E1029 Type 1 diabetes mellitus with other diabetic kidney complication: Secondary | ICD-10-CM | POA: Diagnosis not present

## 2015-12-26 DIAGNOSIS — Z8719 Personal history of other diseases of the digestive system: Secondary | ICD-10-CM | POA: Diagnosis not present

## 2015-12-26 DIAGNOSIS — Z992 Dependence on renal dialysis: Secondary | ICD-10-CM | POA: Diagnosis not present

## 2015-12-26 DIAGNOSIS — Q612 Polycystic kidney, adult type: Secondary | ICD-10-CM | POA: Diagnosis not present

## 2015-12-26 DIAGNOSIS — N186 End stage renal disease: Secondary | ICD-10-CM | POA: Diagnosis not present

## 2015-12-26 DIAGNOSIS — Z9889 Other specified postprocedural states: Secondary | ICD-10-CM | POA: Diagnosis not present

## 2015-12-27 DIAGNOSIS — D509 Iron deficiency anemia, unspecified: Secondary | ICD-10-CM | POA: Diagnosis not present

## 2015-12-27 DIAGNOSIS — D631 Anemia in chronic kidney disease: Secondary | ICD-10-CM | POA: Diagnosis not present

## 2015-12-27 DIAGNOSIS — N186 End stage renal disease: Secondary | ICD-10-CM | POA: Diagnosis not present

## 2015-12-29 DIAGNOSIS — D509 Iron deficiency anemia, unspecified: Secondary | ICD-10-CM | POA: Diagnosis not present

## 2015-12-29 DIAGNOSIS — D631 Anemia in chronic kidney disease: Secondary | ICD-10-CM | POA: Diagnosis not present

## 2015-12-29 DIAGNOSIS — N186 End stage renal disease: Secondary | ICD-10-CM | POA: Diagnosis not present

## 2016-01-01 DIAGNOSIS — D509 Iron deficiency anemia, unspecified: Secondary | ICD-10-CM | POA: Diagnosis not present

## 2016-01-01 DIAGNOSIS — N186 End stage renal disease: Secondary | ICD-10-CM | POA: Diagnosis not present

## 2016-01-01 DIAGNOSIS — D631 Anemia in chronic kidney disease: Secondary | ICD-10-CM | POA: Diagnosis not present

## 2016-01-03 DIAGNOSIS — D509 Iron deficiency anemia, unspecified: Secondary | ICD-10-CM | POA: Diagnosis not present

## 2016-01-03 DIAGNOSIS — D631 Anemia in chronic kidney disease: Secondary | ICD-10-CM | POA: Diagnosis not present

## 2016-01-03 DIAGNOSIS — N186 End stage renal disease: Secondary | ICD-10-CM | POA: Diagnosis not present

## 2016-01-05 DIAGNOSIS — N186 End stage renal disease: Secondary | ICD-10-CM | POA: Diagnosis not present

## 2016-01-05 DIAGNOSIS — D631 Anemia in chronic kidney disease: Secondary | ICD-10-CM | POA: Diagnosis not present

## 2016-01-05 DIAGNOSIS — D509 Iron deficiency anemia, unspecified: Secondary | ICD-10-CM | POA: Diagnosis not present

## 2016-01-08 DIAGNOSIS — D631 Anemia in chronic kidney disease: Secondary | ICD-10-CM | POA: Diagnosis not present

## 2016-01-08 DIAGNOSIS — D509 Iron deficiency anemia, unspecified: Secondary | ICD-10-CM | POA: Diagnosis not present

## 2016-01-08 DIAGNOSIS — N186 End stage renal disease: Secondary | ICD-10-CM | POA: Diagnosis not present

## 2016-01-10 DIAGNOSIS — D509 Iron deficiency anemia, unspecified: Secondary | ICD-10-CM | POA: Diagnosis not present

## 2016-01-10 DIAGNOSIS — N186 End stage renal disease: Secondary | ICD-10-CM | POA: Diagnosis not present

## 2016-01-10 DIAGNOSIS — D631 Anemia in chronic kidney disease: Secondary | ICD-10-CM | POA: Diagnosis not present

## 2016-01-11 DIAGNOSIS — Z9889 Other specified postprocedural states: Secondary | ICD-10-CM | POA: Diagnosis not present

## 2016-01-11 DIAGNOSIS — Z8719 Personal history of other diseases of the digestive system: Secondary | ICD-10-CM | POA: Diagnosis not present

## 2016-01-12 DIAGNOSIS — D631 Anemia in chronic kidney disease: Secondary | ICD-10-CM | POA: Diagnosis not present

## 2016-01-12 DIAGNOSIS — N186 End stage renal disease: Secondary | ICD-10-CM | POA: Diagnosis not present

## 2016-01-12 DIAGNOSIS — D509 Iron deficiency anemia, unspecified: Secondary | ICD-10-CM | POA: Diagnosis not present

## 2016-01-15 DIAGNOSIS — D509 Iron deficiency anemia, unspecified: Secondary | ICD-10-CM | POA: Diagnosis not present

## 2016-01-15 DIAGNOSIS — D631 Anemia in chronic kidney disease: Secondary | ICD-10-CM | POA: Diagnosis not present

## 2016-01-15 DIAGNOSIS — N186 End stage renal disease: Secondary | ICD-10-CM | POA: Diagnosis not present

## 2016-01-17 DIAGNOSIS — D631 Anemia in chronic kidney disease: Secondary | ICD-10-CM | POA: Diagnosis not present

## 2016-01-17 DIAGNOSIS — D509 Iron deficiency anemia, unspecified: Secondary | ICD-10-CM | POA: Diagnosis not present

## 2016-01-17 DIAGNOSIS — N186 End stage renal disease: Secondary | ICD-10-CM | POA: Diagnosis not present

## 2016-01-19 DIAGNOSIS — D509 Iron deficiency anemia, unspecified: Secondary | ICD-10-CM | POA: Diagnosis not present

## 2016-01-19 DIAGNOSIS — N186 End stage renal disease: Secondary | ICD-10-CM | POA: Diagnosis not present

## 2016-01-19 DIAGNOSIS — D631 Anemia in chronic kidney disease: Secondary | ICD-10-CM | POA: Diagnosis not present

## 2016-01-22 ENCOUNTER — Encounter (HOSPITAL_COMMUNITY): Payer: Self-pay | Admitting: Emergency Medicine

## 2016-01-22 ENCOUNTER — Inpatient Hospital Stay (HOSPITAL_COMMUNITY)
Admission: EM | Admit: 2016-01-22 | Discharge: 2016-01-26 | DRG: 280 | Disposition: A | Discharge status: Home / Self Care | Payer: Medicare Other | Attending: Internal Medicine | Admitting: Internal Medicine

## 2016-01-22 ENCOUNTER — Other Ambulatory Visit: Payer: Self-pay

## 2016-01-22 ENCOUNTER — Inpatient Hospital Stay (HOSPITAL_COMMUNITY): Payer: Medicare Other

## 2016-01-22 ENCOUNTER — Emergency Department (HOSPITAL_COMMUNITY): Payer: Medicare Other

## 2016-01-22 DIAGNOSIS — R0600 Dyspnea, unspecified: Secondary | ICD-10-CM | POA: Diagnosis not present

## 2016-01-22 DIAGNOSIS — I5031 Acute diastolic (congestive) heart failure: Secondary | ICD-10-CM

## 2016-01-22 DIAGNOSIS — J9601 Acute respiratory failure with hypoxia: Secondary | ICD-10-CM | POA: Diagnosis present

## 2016-01-22 DIAGNOSIS — I517 Cardiomegaly: Secondary | ICD-10-CM | POA: Diagnosis not present

## 2016-01-22 DIAGNOSIS — R7989 Other specified abnormal findings of blood chemistry: Secondary | ICD-10-CM | POA: Diagnosis not present

## 2016-01-22 DIAGNOSIS — Z794 Long term (current) use of insulin: Secondary | ICD-10-CM

## 2016-01-22 DIAGNOSIS — I120 Hypertensive chronic kidney disease with stage 5 chronic kidney disease or end stage renal disease: Secondary | ICD-10-CM | POA: Diagnosis not present

## 2016-01-22 DIAGNOSIS — M109 Gout, unspecified: Secondary | ICD-10-CM | POA: Diagnosis present

## 2016-01-22 DIAGNOSIS — Z7982 Long term (current) use of aspirin: Secondary | ICD-10-CM | POA: Diagnosis not present

## 2016-01-22 DIAGNOSIS — Z951 Presence of aortocoronary bypass graft: Secondary | ICD-10-CM

## 2016-01-22 DIAGNOSIS — R079 Chest pain, unspecified: Secondary | ICD-10-CM | POA: Diagnosis not present

## 2016-01-22 DIAGNOSIS — I5033 Acute on chronic diastolic (congestive) heart failure: Secondary | ICD-10-CM | POA: Diagnosis present

## 2016-01-22 DIAGNOSIS — I214 Non-ST elevation (NSTEMI) myocardial infarction: Secondary | ICD-10-CM | POA: Diagnosis present

## 2016-01-22 DIAGNOSIS — N186 End stage renal disease: Secondary | ICD-10-CM | POA: Diagnosis not present

## 2016-01-22 DIAGNOSIS — J9690 Respiratory failure, unspecified, unspecified whether with hypoxia or hypercapnia: Secondary | ICD-10-CM

## 2016-01-22 DIAGNOSIS — Z905 Acquired absence of kidney: Secondary | ICD-10-CM | POA: Diagnosis not present

## 2016-01-22 DIAGNOSIS — I252 Old myocardial infarction: Secondary | ICD-10-CM | POA: Diagnosis present

## 2016-01-22 DIAGNOSIS — Z7951 Long term (current) use of inhaled steroids: Secondary | ICD-10-CM

## 2016-01-22 DIAGNOSIS — E1151 Type 2 diabetes mellitus with diabetic peripheral angiopathy without gangrene: Secondary | ICD-10-CM | POA: Diagnosis present

## 2016-01-22 DIAGNOSIS — Z91041 Radiographic dye allergy status: Secondary | ICD-10-CM | POA: Diagnosis not present

## 2016-01-22 DIAGNOSIS — I132 Hypertensive heart and chronic kidney disease with heart failure and with stage 5 chronic kidney disease, or end stage renal disease: Secondary | ICD-10-CM | POA: Diagnosis present

## 2016-01-22 DIAGNOSIS — N2581 Secondary hyperparathyroidism of renal origin: Secondary | ICD-10-CM | POA: Diagnosis present

## 2016-01-22 DIAGNOSIS — Z992 Dependence on renal dialysis: Secondary | ICD-10-CM | POA: Diagnosis not present

## 2016-01-22 DIAGNOSIS — Z87891 Personal history of nicotine dependence: Secondary | ICD-10-CM

## 2016-01-22 DIAGNOSIS — T8612 Kidney transplant failure: Secondary | ICD-10-CM | POA: Diagnosis present

## 2016-01-22 DIAGNOSIS — E1122 Type 2 diabetes mellitus with diabetic chronic kidney disease: Secondary | ICD-10-CM | POA: Diagnosis present

## 2016-01-22 DIAGNOSIS — I150 Renovascular hypertension: Secondary | ICD-10-CM | POA: Diagnosis present

## 2016-01-22 DIAGNOSIS — Z8774 Personal history of (corrected) congenital malformations of heart and circulatory system: Secondary | ICD-10-CM | POA: Diagnosis not present

## 2016-01-22 DIAGNOSIS — J189 Pneumonia, unspecified organism: Secondary | ICD-10-CM | POA: Diagnosis present

## 2016-01-22 DIAGNOSIS — Z79899 Other long term (current) drug therapy: Secondary | ICD-10-CM

## 2016-01-22 DIAGNOSIS — E669 Obesity, unspecified: Secondary | ICD-10-CM | POA: Diagnosis present

## 2016-01-22 DIAGNOSIS — I10 Essential (primary) hypertension: Secondary | ICD-10-CM | POA: Diagnosis not present

## 2016-01-22 DIAGNOSIS — E118 Type 2 diabetes mellitus with unspecified complications: Secondary | ICD-10-CM | POA: Diagnosis present

## 2016-01-22 DIAGNOSIS — R778 Other specified abnormalities of plasma proteins: Secondary | ICD-10-CM

## 2016-01-22 DIAGNOSIS — I5021 Acute systolic (congestive) heart failure: Secondary | ICD-10-CM | POA: Diagnosis not present

## 2016-01-22 DIAGNOSIS — R0602 Shortness of breath: Secondary | ICD-10-CM | POA: Diagnosis not present

## 2016-01-22 DIAGNOSIS — D649 Anemia, unspecified: Secondary | ICD-10-CM | POA: Diagnosis present

## 2016-01-22 DIAGNOSIS — T8610 Unspecified complication of kidney transplant: Secondary | ICD-10-CM | POA: Diagnosis not present

## 2016-01-22 DIAGNOSIS — Y95 Nosocomial condition: Secondary | ICD-10-CM | POA: Diagnosis present

## 2016-01-22 DIAGNOSIS — D631 Anemia in chronic kidney disease: Secondary | ICD-10-CM | POA: Diagnosis present

## 2016-01-22 DIAGNOSIS — T8619 Other complication of kidney transplant: Secondary | ICD-10-CM

## 2016-01-22 DIAGNOSIS — D638 Anemia in other chronic diseases classified elsewhere: Secondary | ICD-10-CM | POA: Diagnosis present

## 2016-01-22 DIAGNOSIS — D591 Other autoimmune hemolytic anemias: Secondary | ICD-10-CM | POA: Diagnosis not present

## 2016-01-22 DIAGNOSIS — J90 Pleural effusion, not elsewhere classified: Secondary | ICD-10-CM

## 2016-01-22 DIAGNOSIS — J9600 Acute respiratory failure, unspecified whether with hypoxia or hypercapnia: Secondary | ICD-10-CM | POA: Diagnosis present

## 2016-01-22 DIAGNOSIS — Z6832 Body mass index (BMI) 32.0-32.9, adult: Secondary | ICD-10-CM

## 2016-01-22 DIAGNOSIS — E1129 Type 2 diabetes mellitus with other diabetic kidney complication: Secondary | ICD-10-CM | POA: Diagnosis not present

## 2016-01-22 DIAGNOSIS — R509 Fever, unspecified: Secondary | ICD-10-CM | POA: Diagnosis present

## 2016-01-22 LAB — MAGNESIUM: MAGNESIUM: 1.8 mg/dL (ref 1.7–2.4)

## 2016-01-22 LAB — COMPREHENSIVE METABOLIC PANEL
ALT: 10 U/L — ABNORMAL LOW (ref 17–63)
AST: 24 U/L (ref 15–41)
Albumin: 3.2 g/dL — ABNORMAL LOW (ref 3.5–5.0)
Alkaline Phosphatase: 62 U/L (ref 38–126)
Anion gap: 16 — ABNORMAL HIGH (ref 5–15)
BUN: 47 mg/dL — AB (ref 6–20)
CHLORIDE: 98 mmol/L — AB (ref 101–111)
CO2: 22 mmol/L (ref 22–32)
Calcium: 9.3 mg/dL (ref 8.9–10.3)
Creatinine, Ser: 12.73 mg/dL — ABNORMAL HIGH (ref 0.61–1.24)
GFR, EST AFRICAN AMERICAN: 4 mL/min — AB (ref >60)
GFR, EST NON AFRICAN AMERICAN: 4 mL/min — AB (ref >60)
GLUCOSE: 84 mg/dL (ref 65–99)
POTASSIUM: 5.3 mmol/L — AB (ref 3.5–5.1)
Sodium: 136 mmol/L (ref 135–145)
Total Bilirubin: 1 mg/dL (ref 0.3–1.2)
Total Protein: 7.2 g/dL (ref 6.5–8.1)

## 2016-01-22 LAB — CBC
HCT: 30.6 % — ABNORMAL LOW (ref 39.0–52.0)
Hemoglobin: 9 g/dL — ABNORMAL LOW (ref 13.0–17.0)
MCH: 25.4 pg — AB (ref 26.0–34.0)
MCHC: 29.4 g/dL — ABNORMAL LOW (ref 30.0–36.0)
MCV: 86.4 fL (ref 78.0–100.0)
PLATELETS: 190 10*3/uL (ref 150–400)
RBC: 3.54 MIL/uL — ABNORMAL LOW (ref 4.22–5.81)
RDW: 18.1 % — AB (ref 11.5–15.5)
WBC: 5.7 10*3/uL (ref 4.0–10.5)

## 2016-01-22 LAB — CBC WITH DIFFERENTIAL/PLATELET
Basophils Absolute: 0.1 10*3/uL (ref 0.0–0.1)
Basophils Relative: 1 %
EOS PCT: 1 %
Eosinophils Absolute: 0.1 10*3/uL (ref 0.0–0.7)
HCT: 18.1 % — ABNORMAL LOW (ref 39.0–52.0)
Hemoglobin: 5.5 g/dL — CL (ref 13.0–17.0)
LYMPHS ABS: 2.3 10*3/uL (ref 0.7–4.0)
LYMPHS PCT: 26 %
MCH: 25.8 pg — AB (ref 26.0–34.0)
MCHC: 30.4 g/dL (ref 30.0–36.0)
MCV: 85 fL (ref 78.0–100.0)
MONOS PCT: 9 %
Monocytes Absolute: 0.7 10*3/uL (ref 0.1–1.0)
Neutro Abs: 5.5 10*3/uL (ref 1.7–7.7)
Neutrophils Relative %: 64 %
Platelets: 213 10*3/uL (ref 150–400)
RBC: 2.13 MIL/uL — AB (ref 4.22–5.81)
RDW: 18.3 % — ABNORMAL HIGH (ref 11.5–15.5)
WBC: 8.6 10*3/uL (ref 4.0–10.5)

## 2016-01-22 LAB — GLUCOSE, CAPILLARY
Glucose-Capillary: 80 mg/dL (ref 65–99)
Glucose-Capillary: 116 mg/dL — ABNORMAL HIGH (ref 65–99)

## 2016-01-22 LAB — TYPE AND SCREEN
ABO/RH(D): O POS
ANTIBODY SCREEN: POSITIVE

## 2016-01-22 LAB — D-DIMER, QUANTITATIVE (NOT AT ARMC): D DIMER QUANT: 2.23 ug{FEU}/mL — AB (ref 0.00–0.50)

## 2016-01-22 LAB — TROPONIN I
TROPONIN I: 4.49 ng/mL — AB (ref <0.03)
Troponin I: 3.5 ng/mL (ref <0.03)
Troponin I: 5.01 ng/mL (ref <0.03)

## 2016-01-22 LAB — PREPARE RBC (CROSSMATCH)

## 2016-01-22 LAB — BRAIN NATRIURETIC PEPTIDE

## 2016-01-22 LAB — POC OCCULT BLOOD, ED: FECAL OCCULT BLD: NEGATIVE

## 2016-01-22 LAB — I-STAT TROPONIN, ED: TROPONIN I, POC: 3.53 ng/mL — AB (ref 0.00–0.08)

## 2016-01-22 LAB — I-STAT CG4 LACTIC ACID, ED
Lactic Acid, Venous: 1.22 mmol/L (ref 0.5–1.9)
Lactic Acid, Venous: 1.79 mmol/L (ref 0.5–1.9)

## 2016-01-22 LAB — MRSA PCR SCREENING: MRSA by PCR: NEGATIVE

## 2016-01-22 LAB — PHOSPHORUS: PHOSPHORUS: 3.8 mg/dL (ref 2.5–4.6)

## 2016-01-22 MED ORDER — SODIUM CHLORIDE 0.9 % IV SOLN
10.0000 mL/h | Freq: Once | INTRAVENOUS | Status: AC
  Administered 2016-01-22: 10 mL/h via INTRAVENOUS

## 2016-01-22 MED ORDER — LIDOCAINE-PRILOCAINE 2.5-2.5 % EX CREA: 1.0000 "application " | TOPICAL_CREAM | CUTANEOUS | Status: DC | PRN

## 2016-01-22 MED ORDER — HYDROCORTISONE NA SUCCINATE PF 100 MG IJ SOLR: 200.0000 mg | Freq: Once | INTRAMUSCULAR | Status: DC

## 2016-01-22 MED ORDER — SODIUM CHLORIDE 0.9 % IV SOLN: Freq: Once | INTRAVENOUS | Status: AC

## 2016-01-22 MED ORDER — LIDOCAINE HCL (PF) 1 % IJ SOLN: 5.0000 mL | INTRAMUSCULAR | Status: DC | PRN

## 2016-01-22 MED ORDER — ASPIRIN EC 81 MG PO TBEC
81.0000 mg | DELAYED_RELEASE_TABLET | Freq: Every day | ORAL | Status: DC
  Administered 2016-01-24 – 2016-01-26 (×3): 81 mg via ORAL
  Filled 2016-01-22 (×4): qty 1

## 2016-01-22 MED ORDER — IPRATROPIUM-ALBUTEROL 0.5-2.5 (3) MG/3ML IN SOLN: 3.0000 mL | RESPIRATORY_TRACT | Status: DC | PRN

## 2016-01-22 MED ORDER — PENTAFLUOROPROP-TETRAFLUOROETH EX AERO: 1.0000 "application " | INHALATION_SPRAY | CUTANEOUS | Status: DC | PRN

## 2016-01-22 MED ORDER — DIPHENHYDRAMINE HCL 25 MG PO CAPS: 50.0000 mg | ORAL_CAPSULE | Freq: Once | ORAL | Status: DC

## 2016-01-22 MED ORDER — DARBEPOETIN ALFA 200 MCG/0.4ML IJ SOSY
PREFILLED_SYRINGE | INTRAMUSCULAR | Status: AC
  Filled 2016-01-22: qty 0.4

## 2016-01-22 MED ORDER — ONDANSETRON HCL 4 MG/2ML IJ SOLN: 4.0000 mg | Freq: Four times a day (QID) | INTRAMUSCULAR | Status: DC | PRN

## 2016-01-22 MED ORDER — ACETAMINOPHEN 650 MG RE SUPP: 650.0000 mg | Freq: Four times a day (QID) | RECTAL | Status: DC | PRN

## 2016-01-22 MED ORDER — CARVEDILOL 25 MG PO TABS
25.0000 mg | ORAL_TABLET | Freq: Two times a day (BID) | ORAL | Status: DC
  Administered 2016-01-22 – 2016-01-24 (×4): 25 mg via ORAL
  Filled 2016-01-22 (×4): qty 1

## 2016-01-22 MED ORDER — ONDANSETRON HCL 4 MG/2ML IJ SOLN
4.0000 mg | Freq: Four times a day (QID) | INTRAMUSCULAR | Status: DC | PRN
Start: 2016-01-22 — End: 2016-01-26

## 2016-01-22 MED ORDER — VANCOMYCIN HCL IN DEXTROSE 1-5 GM/200ML-% IV SOLN
1000.0000 mg | Freq: Once | INTRAVENOUS | Status: AC
  Administered 2016-01-22: 1000 mg via INTRAVENOUS
  Filled 2016-01-22: qty 200

## 2016-01-22 MED ORDER — ONDANSETRON HCL 4 MG PO TABS: 4.0000 mg | ORAL_TABLET | Freq: Four times a day (QID) | ORAL | Status: DC | PRN

## 2016-01-22 MED ORDER — SODIUM CHLORIDE 0.9 % IV SOLN: 100.0000 mL | INTRAVENOUS | Status: DC | PRN

## 2016-01-22 MED ORDER — ISOSORBIDE MONONITRATE ER 30 MG PO TB24
30.0000 mg | ORAL_TABLET | Freq: Every day | ORAL | Status: DC
  Administered 2016-01-23 – 2016-01-26 (×4): 30 mg via ORAL
  Filled 2016-01-22 (×4): qty 1

## 2016-01-22 MED ORDER — CALCIUM CARBONATE 1250 (500 CA) MG PO TABS
1.0000 | ORAL_TABLET | Freq: Every day | ORAL | Status: DC
  Administered 2016-01-23 – 2016-01-26 (×4): 500 mg via ORAL
  Filled 2016-01-22 (×4): qty 1

## 2016-01-22 MED ORDER — VANCOMYCIN HCL IN DEXTROSE 1-5 GM/200ML-% IV SOLN
1000.0000 mg | Freq: Once | INTRAVENOUS | Status: DC
  Administered 2016-01-22: 1000 mg via INTRAVENOUS
  Filled 2016-01-22: qty 200

## 2016-01-22 MED ORDER — HEPARIN SODIUM (PORCINE) 1000 UNIT/ML DIALYSIS: 1000.0000 [IU] | INTRAMUSCULAR | Status: DC | PRN

## 2016-01-22 MED ORDER — ALBUTEROL SULFATE (2.5 MG/3ML) 0.083% IN NEBU
5.0000 mg | INHALATION_SOLUTION | Freq: Once | RESPIRATORY_TRACT | Status: AC
  Administered 2016-01-22: 5 mg via RESPIRATORY_TRACT
  Filled 2016-01-22: qty 6

## 2016-01-22 MED ORDER — HEPARIN SODIUM (PORCINE) 1000 UNIT/ML DIALYSIS: 1500.0000 [IU] | Freq: Once | INTRAMUSCULAR | Status: DC

## 2016-01-22 MED ORDER — ATORVASTATIN CALCIUM 10 MG PO TABS
10.0000 mg | ORAL_TABLET | Freq: Every day | ORAL | Status: DC
  Administered 2016-01-23 – 2016-01-25 (×3): 10 mg via ORAL
  Filled 2016-01-22 (×4): qty 1

## 2016-01-22 MED ORDER — INSULIN ASPART 100 UNIT/ML ~~LOC~~ SOLN: 0.0000 [IU] | Freq: Every day | SUBCUTANEOUS | Status: DC

## 2016-01-22 MED ORDER — PIPERACILLIN-TAZOBACTAM IN DEX 2-0.25 GM/50ML IV SOLN
2.2500 g | Freq: Three times a day (TID) | INTRAVENOUS | Status: DC
  Filled 2016-01-22: qty 50

## 2016-01-22 MED ORDER — COLCHICINE 0.6 MG PO TABS: 0.6000 mg | ORAL_TABLET | ORAL | Status: DC

## 2016-01-22 MED ORDER — VANCOMYCIN HCL IN DEXTROSE 1-5 GM/200ML-% IV SOLN
1000.0000 mg | Freq: Once | INTRAVENOUS | Status: DC
  Filled 2016-01-22: qty 200

## 2016-01-22 MED ORDER — ACETAMINOPHEN 325 MG PO TABS
650.0000 mg | ORAL_TABLET | Freq: Once | ORAL | Status: AC
  Administered 2016-01-22: 650 mg via ORAL
  Filled 2016-01-22: qty 2

## 2016-01-22 MED ORDER — PIPERACILLIN-TAZOBACTAM 3.375 G IVPB 30 MIN
3.3750 g | Freq: Once | INTRAVENOUS | Status: AC
  Administered 2016-01-22: 3.375 g via INTRAVENOUS
  Filled 2016-01-22: qty 50

## 2016-01-22 MED ORDER — ACETAMINOPHEN 325 MG PO TABS: 650.0000 mg | ORAL_TABLET | ORAL | Status: DC | PRN

## 2016-01-22 MED ORDER — CETYLPYRIDINIUM CHLORIDE 0.05 % MT LIQD
7.0000 mL | Freq: Two times a day (BID) | OROMUCOSAL | Status: DC
  Administered 2016-01-23 – 2016-01-26 (×7): 7 mL via OROMUCOSAL

## 2016-01-22 MED ORDER — DARBEPOETIN ALFA 200 MCG/0.4ML IJ SOSY
200.0000 ug | PREFILLED_SYRINGE | INTRAMUSCULAR | Status: DC
  Administered 2016-01-22: 200 ug via INTRAVENOUS

## 2016-01-22 MED ORDER — AMLODIPINE BESYLATE 2.5 MG PO TABS
2.5000 mg | ORAL_TABLET | Freq: Every day | ORAL | Status: DC
  Filled 2016-01-22: qty 1

## 2016-01-22 MED ORDER — ALTEPLASE 2 MG IJ SOLR: 2.0000 mg | Freq: Once | INTRAMUSCULAR | Status: DC | PRN

## 2016-01-22 MED ORDER — ACETAMINOPHEN 325 MG PO TABS: 650.0000 mg | ORAL_TABLET | Freq: Four times a day (QID) | ORAL | Status: DC | PRN

## 2016-01-22 MED ORDER — INSULIN ASPART 100 UNIT/ML ~~LOC~~ SOLN: 0.0000 [IU] | Freq: Three times a day (TID) | SUBCUTANEOUS | Status: DC

## 2016-01-22 MED ORDER — SODIUM CHLORIDE 0.9% FLUSH
3.0000 mL | Freq: Two times a day (BID) | INTRAVENOUS | Status: DC
  Administered 2016-01-23 – 2016-01-26 (×7): 3 mL via INTRAVENOUS

## 2016-01-22 MED ORDER — ACETAMINOPHEN 325 MG PO TABS
650.0000 mg | ORAL_TABLET | ORAL | Status: DC | PRN
  Administered 2016-01-22: 650 mg via ORAL
  Filled 2016-01-22: qty 2

## 2016-01-22 NOTE — ED Notes (Signed)
Jarrett Soho, RN going to VQ scan with transport.

## 2016-01-22 NOTE — Progress Notes (Signed)
Pharmacy Code Sepsis Protocol  Time of code sepsis page: 0650 []  Antibiotics delivered at  []  Antibiotics administered prior to code at  (if checked, omit next 2 questions)  Were antibiotics ordered at the time of the code sepsis page? No Was it required to contact the physician? []  Physician not contacted [x]  Physician contacted to order antibiotics for code sepsis []  Physician contacted to recommend changing antibiotics  Pharmacy consulted for: MD does not think abx warranted at this time. Explained 1h time window to MD.  Anti-infectives    None        Nurse education provided: []  Minutes left to administer antibiotics to achieve 1 hour goal []  Correct order of antibiotic administration []  Antibiotic Y-site compatibilities     Romona Curls, PharmD 01/22/2016, 7:26 AM

## 2016-01-22 NOTE — ED Provider Notes (Signed)
CSN: [id number]     Arrival date & time 01/22/16  0612 History   First MD Initiated Contact with Patient 01/22/16 [phone]     Chief Complaint  Patient presents with  . Shortness of Breath    HPI   Patrick [name] is an 60 y.o. male with history of ESRD (T/Th/Sat dialysis), DM, HTN who presents to the ED for evaluation of shortness of breath. He states his symptoms started suddenly around 11PM last night and have been persistent and constant. He states it is unchanged with exertion. Denies chest pain. Denies new lower extremity swelling or pain. He reports associated intermittent nausea with two episodes of NBNB emesis overnight. His nausea has since resolved. He denies any other symptoms. Denies cough, congestion, abdominal pain, diarrhea. He does not make urine. He states he went to dialysis Saturday as scheduled and was on his way to dialysis this morning but decided to come to the [facility] because he felt so bad. Denies blurry vision, feeling dizzy/lightheaded, weakness, numbness. He has otherwise been taking his medications as prescribed.  Past Medical History  Diagnosis Date  . Hypertension   . Diabetes mellitus without complication ([place])   . End-stage renal failure with renal transplant ([place])   . Gout   . Impotence of organic origin   . Type I (juvenile type) diabetes mellitus with renal manifestations, not stated as uncontrolled   . Secondary hyperparathyroidism [facility])    Past Surgical History  Procedure Laterality Date  . Birth defect heart surgery  1996  . Kidney transplant  2009  . Appendectomy    . Av fistula placement Right 09/18/2014    Procedure: INSERTION OF ARTERIOVENOUS (AV) GORE-TEX GRAFT ARM USING 4-7MM  X 45CM STRETCH GORE-TEX VASCULAR GRAFT;  Surgeon: Rosetta Posner, MD;  Location: [facility] OR;  Service: Vascular;  Laterality: Right;   Family History  Problem Relation Age of Onset  . Hyperlipidemia Mother   . Hypertension Mother    Social History  Substance Use Topics  . Smoking  status: Former Research scientist (life sciences)  . Smokeless tobacco: None  . Alcohol Use: No    Review of Systems  All other systems reviewed and are negative.     Allergies  Iodinated diagnostic agents  Home Medications   Prior to Admission medications   Medication Sig Start Date End Date Taking? Authorizing Provider  aspirin 81 MG tablet Take 81 mg by mouth daily.    Historical Provider, MD  atorvastatin (LIPITOR) 10 MG tablet Take 1 tablet (10 mg total) by mouth daily at 6 PM. 11/23/15   Theodis Blaze, MD  calcium carbonate (OS-CAL - DOSED IN MG OF ELEMENTAL CALCIUM) 1250 (500 Ca) MG tablet Take 1 tablet by mouth daily with breakfast.    Historical Provider, MD  carvedilol (COREG) 25 MG tablet Take 25 mg by mouth 2 (two) times daily with a meal.    Historical Provider, MD  colchicine 0.6 MG tablet Take 0.5 tablets (0.3 mg total) by mouth daily. 11/23/15   Theodis Blaze, MD  ipratropium-albuterol (DUONEB) 0.5-2.5 (3) MG/3ML SOLN Take 3 mLs by nebulization every 4 (four) hours as needed. 11/23/15   Theodis Blaze, MD  isosorbide mononitrate (IMDUR) 60 MG 24 hr tablet Take 1 tablet (60 mg total) by mouth daily. 11/23/15   Theodis Blaze, MD   BP 145/95 mmHg  Pulse 112  Temp(Src) 100.6 F (38.1 C) (Rectal)  Resp 29  Ht 5' 6.5" (1.689 m)  Wt 91.627 kg  BMI 32.12 kg/m2  SpO2 100% Physical Exam  Constitutional: He is oriented to person, place, and time.  Chronically ill appearing. Mildly tachypneic.  HENT:  Right Ear: External ear normal.  Left Ear: External ear normal.  Nose: Nose normal.  Mouth/Throat: Oropharynx is clear and moist. No oropharyngeal exudate.  Eyes: Conjunctivae and EOM are normal. Pupils are equal, round, and reactive to light.  Bilateral cerumen impaction  Neck: Normal range of motion. Neck supple.  Cardiovascular: Regular rhythm, normal heart sounds and intact distal pulses.  Tachycardia present.   Pulmonary/Chest: Effort normal and breath sounds normal. No respiratory distress. He  has no wheezes. He exhibits no tenderness.  Mildly tachypneic though able to speak in full sentences. No accessory muscle usage. Bilateral soft rhonchi at lung bases that clear with coughing. Minimal bilateral basilar expiratory wheezing.  Abdominal: Soft. Bowel sounds are normal. He exhibits no distension. There is no tenderness.  Genitourinary: Rectum normal.  Stool brown on glove. No gross blood. No masses or lesions palpated. Normal rectal tone.  Musculoskeletal:  Trace bilateral LE edema  Lymphadenopathy:    He has no cervical adenopathy.  Neurological: He is alert and oriented to person, place, and time. No cranial nerve deficit.  Skin: Skin is warm and dry.  Psychiatric: He has a normal mood and affect.  Nursing note and vitals reviewed.  Filed Vitals:   01/22/16 0738 01/22/16 0745 01/22/16 0748 01/22/16 0800  BP: 145/95 125/112  142/100  Pulse: 112 116  116  Temp:   100.6 F (38.1 C)   TempSrc:   Rectal   Resp: 29 27  24   Height:      Weight:      SpO2: 100% 100%  100%     ED Course  Procedures (including critical care time) Labs Review Labs Reviewed  COMPREHENSIVE METABOLIC PANEL - Abnormal; Notable for the following:    Potassium 5.3 (*)    Chloride 98 (*)    BUN 47 (*)    Creatinine, Ser 12.73 (*)    Albumin 3.2 (*)    ALT 10 (*)    GFR calc non Af Amer 4 (*)    GFR calc Af Amer 4 (*)    Anion gap 16 (*)    All other components within normal limits  CBC WITH DIFFERENTIAL/PLATELET - Abnormal; Notable for the following:    RBC 2.13 (*)    Hemoglobin 5.5 (*)    HCT 18.1 (*)    MCH 25.8 (*)    RDW 18.3 (*)    All other components within normal limits  BRAIN NATRIURETIC PEPTIDE - Abnormal; Notable for the following:    B Natriuretic Peptide >4500.0 (*)    All other components within normal limits  I-STAT TROPOININ, ED - Abnormal; Notable for the following:    Troponin i, poc 3.53 (*)    All other components within normal limits  CULTURE, BLOOD (ROUTINE X  2)  CULTURE, BLOOD (ROUTINE X 2)  URINE CULTURE  URINALYSIS, ROUTINE W REFLEX MICROSCOPIC (NOT AT [facility])  OCCULT BLOOD X 1 CARD TO LAB, STOOL  D-DIMER, QUANTITATIVE (NOT AT [facility])  I-STAT CG4 LACTIC ACID, ED  POC OCCULT BLOOD, ED  I-STAT CG4 LACTIC ACID, ED  TYPE AND SCREEN  PREPARE RBC (CROSSMATCH)    Imaging Review Dg Chest 2 View  01/22/2016  CLINICAL DATA:  Shortness of breath and vomiting. Chronic renal failure EXAM: CHEST  2 VIEW COMPARISON:  November 17, 2015 chest radiograph and November 23, 2015 chest CT FINDINGS: There is a small left pleural effusion. There is mild left base atelectasis. Lungs elsewhere clear. Heart is enlarged with pulmonary vascularity within normal limits. Patient is status post median sternotomy. There is a stent in the right innominate vein region. No adenopathy. No bone lesions. IMPRESSION: Stable cardiomegaly. Small left effusion with left base atelectasis. Lungs elsewhere clear. Electronically Signed   By: Lowella Grip III M.D.   On: 01/22/2016 07:04   I have personally reviewed and evaluated these images and lab results as part of my medical decision-making.   EKG Interpretation   Date/Time:  Tuesday January 22 2016 06:24:37 EDT Ventricular Rate:  125 PR Interval:    QRS Duration: 80 QT Interval:  322 QTC Calculation: 465 R Axis:   94 Text Interpretation:  Sinus tachycardia Right axis deviation Abnormal T,  consider ischemia, lateral leads No significant change since last tracing  Confirmed by Winfred Leeds  MD, Maleeah Crossman [phone]) on 01/22/2016 7:59:05 AM      MDM   Final diagnoses:  Shortness of breath  Elevated troponin    Patrick [name] is an 60 y.o. male with ESRD, DM, and HTN who presents to the ED for evaluation of shortness of breath. He was initially febrile to 100.44F orally (100.6 rectally) with tachycardia in the 120s. No hypoxia on room air. No hypotension. Mild tachypnea and increased WOB. Initially code sepsis was called and sepsis workup  initiated. However, CXR is negative and CBC reveals a hemoglobin of 5.5 (baseline closer to 8.5). His troponin is elevated to 3.53 today. He continues to deny chest pain and his EKG is unchanged from prior. His previous troponins have been mildly elevated to 0.1-0.2. 2U PRBC have been ordered. Heme occult is negative. I suspect pt's symptoms are secondary to symptomatic anemia. I have cancelled the code sepsis and pt has received initial dose of vanc/zosyn but will d/c future orders for now. His lactic acid is negative and he has no elevated WBC count. His elevated troponin is likely due to demand ischemia, though it is markedly elevated compared to priors. Low suspicion for pulmonary embolism. I will call medicine for medical admission for symptomatic anemia.   I spoke with Dr. Marily Memos of the hospitalist service who will admit pt to [place]. He would like me to call cardiology for consultation and recommendations regarding a heparin drip. He would also like CT angio PE study. Will order. Will complete initial doses of antibiotics but discontinue future doses as above.  I spoke with Trish from the cardiology service. Hold off on heparin drip due to pt's hemoglobin of 5.5.  Pt has a contrast allergy. I spoke with CT and we will plan to premedicate with hydrocortisone and benadryl, and will obtain the CT angio before pt goes upstairs.   Unfortunately CT angio will not be able to be obtained as pt only has one 22G IV in place. Will cancel. I discussed with Dr. Marily Memos who is aware and we will hold off on PE study for now. Dr. Jonnie Finner is also aware pt is here and needing dialysis. We will hold off on blood transfusion in the ED and pt will get transfused while he is at dialysis. Appreciate assistance from all.      Anne Ng, PA-C 01/22/16 [id number]  Orlie Dakin, MD 01/22/16 [phone]

## 2016-01-22 NOTE — Consult Note (Signed)
CARDIOLOGY CONSULT NOTE  Patient ID: Patrick [name] MRN: [id number] DOB/AGE: Mar 29, 1956 60 y.o.  Admit date: 01/22/2016 Referring Physician  Linna Darner, MD Primary Physician:  Elyn Peers, MD Reason for Consultation  Chest pain and dyspnea  HPI: Patrick [name]  is a 60 y.o. 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 Patient was admitted with atypical chest pain and dyspnea and was found to be severely anemic in April 2017 to [facility] when I had last seen him. Patient has history of abnormal stress test that was done sometime in June 2016 revealing inferior ischemia, however ischemia was felt to be mild and medical therapy was recommended.  He was doing well until last 3-4 days, states that he started rapidly down the pain worsening dyspnea on exertion. He also developed orthopnea for the past 2 days has not been able to lay down flat in the bed and has been sitting up. He denies any form of chest pain unit with exertional activity. His wife is present the bedside. States that otherwise he feels well.  He underwent hemodialysis this morning, states that his dyspnea has improved. Denies any palpitations, dizziness or syncope.   Past Medical History  Diagnosis Date  . Hypertension   . Diabetes mellitus without complication ([place])   . End-stage renal failure with renal transplant ([place])   . Gout   . Impotence of organic origin   . Type I (juvenile type) diabetes mellitus with renal manifestations, not stated as uncontrolled   . Secondary hyperparathyroidism [facility])      Past Surgical History   Procedure Laterality Date  . Birth defect heart surgery  1996  . Kidney transplant  2009  . Appendectomy    . Av fistula placement Right 09/18/2014    Procedure: INSERTION OF ARTERIOVENOUS (AV) GORE-TEX GRAFT ARM USING 4-7MM  X 45CM STRETCH GORE-TEX VASCULAR GRAFT;  Surgeon: Rosetta Posner, MD;  [facility];  Service: Vascular;  Laterality: Right;     Family History  Problem Relation Age of Onset  . Hyperlipidemia Mother   . Hypertension Mother      Social History: Social History   Social History  . Marital Status: Married    Spouse Name: N/A  . Number of Children: N/A  . Years of Education: N/A   Occupational History  . Not on file.   Social History Main Topics  . Smoking status: Former Research scientist (life sciences)  . Smokeless tobacco: Not on file  . Alcohol Use: No  . Drug Use: No  . Sexual Activity: Not Currently    Birth Control/ Protection: None   Other Topics Concern  . Not on file   Social History Narrative     Prescriptions prior to admission  Medication Sig Dispense Refill Last Dose  . amLODipine (NORVASC) 2.5 MG tablet Take 2.5 mg by mouth daily.   01/21/2016 at Unknown time  . aspirin 81 MG tablet Take 81 mg by mouth daily.   01/21/2016 at Unknown time  . calcium carbonate (OS-CAL - DOSED IN MG OF ELEMENTAL CALCIUM) 1250 (500 Ca) MG tablet Take 1 tablet by mouth daily with  breakfast.   01/21/2016 at Unknown time  . carvedilol (COREG) 25 MG tablet Take 25 mg by mouth 2 (two) times daily with a meal.   01/21/2016 at 9p  . ipratropium-albuterol (DUONEB) 0.5-2.5 (3) MG/3ML SOLN Take 3 mLs by nebulization every 4 (four) hours as needed. 360 mL  Past Week at Unknown time  . isosorbide mononitrate (IMDUR) 30 MG 24 hr tablet Take 30 mg by mouth daily.   01/20/2016 at Unknown time  . atorvastatin (LIPITOR) 10 MG tablet Take 1 tablet (10 mg total) by mouth daily at 6 PM.     . colchicine 0.6 MG tablet Take 0.5 tablets (0.3 mg total) by mouth daily. (Patient taking differently: Take 0.6 mg  by mouth every 14 (fourteen) days. ) 30 tablet 0 01/09/2016  . isosorbide mononitrate (IMDUR) 60 MG 24 hr tablet Take 1 tablet (60 mg total) by mouth daily.        ROS: General: no fevers/chills/night sweats Eyes: no blurry vision, diplopia, or amaurosis ENT: no sore throat or hearing loss Resp: dyspnea on exertion present, orthopnea present, no hemoptysis CV: Denies chest pain. No dizziness, palpitations. Complains of bilateral leg cramps suggestive of claudication. GI: no abdominal pain, nausea, vomiting, diarrhea, or constipation GU: no dysuria, frequency, or hematuria. Presently on hemodialysis. Skin: no rash Neuro: no headache, numbness, tingling, or weakness of extremities Musculoskeletal: weakness in both the lower extremity is with activity, cramping in the legs with activity. Heme: no bleeding, DVT, or easy bruising Endo: no polydipsia or polyuria    Physical Exam: Blood pressure 134/99, pulse 113, temperature 98.6 F (37 C), temperature source Oral, resp. rate 31, height 5' 6.5" (1.689 m), weight 82.3 kg (181 lb 7 oz), SpO2 99 %. Body mass index is 28.85 kg/(m^2).  General appearance: alert, cooperative, appears older than stated age and no distress Lungs: clear to auscultation bilaterally Heart: Tachycardia present, regular rate and rhythm, S1, S2 normal, no murmur, click, rub or gallop Abdomen: soft, non-tender; bowel sounds normal; no masses, no organomegaly and incision at the surgical site in the right iliac fossa (transplant nephrectomy) has healed well.  Extremities: extremities normal, atraumatic, no cyanosis or edema and Skin appears to have chronic ischemia with loss of hair and shiny skin. Pulses: carotid arteries normal, bilateral femoral arteries one to 2+ without bruit. Popliteal pulses absent pedal pulses absent bilaterally. Right brachial AV fistula present, functioning. Previous right forearm and left forearm fistula present, nonfunctioning. Neurologic:  Grossly normal Labs:   Lab Results  Component Value Date   WBC 5.7 01/22/2016   HGB 9.0* 01/22/2016   HCT 30.6* 01/22/2016   MCV 86.4 01/22/2016   PLT 190 01/22/2016    Recent Labs Lab 01/22/16 0735  NA 136  K 5.3*  CL 98*  CO2 22  BUN 47*  CREATININE 12.73*  CALCIUM 9.3  PROT 7.2  BILITOT 1.0  ALKPHOS 62  ALT 10*  AST 24  GLUCOSE 84    Lipid Panel     Component Value Date/Time   CHOL 147 11/19/2015 0441   TRIG 229* 11/19/2015 0441   HDL 22* 11/19/2015 0441   CHOLHDL 6.7 11/19/2015 0441   VLDL 46* 11/19/2015 0441   LDLCALC 79 11/19/2015 0441    BNP (last 3 results)  Recent Labs  10/30/15 0855 11/17/15 0759 01/22/16 0735  BNP 2633.8* 2608.9* >4500.0*    HEMOGLOBIN A1C Lab Results  Component Value Date   HGBA1C 5.4 10/11/2015   MPG 108 10/11/2015  Cardiac Panel (last 3 results)  Recent Labs  11/17/15 1817 01/22/16 0828 01/22/16 1720  TROPONINI 0.12* 3.50* 5.01*    Lab Results  Component Value Date   TROPONINI 5.01* 01/22/2016     Recent Labs  11/19/15 0441 11/20/15 0806  TSH 7.292* 9.512*    EKG 01/22/2016: Sinus tachycardia at a rate of 125 bpm, normal axis, LVH, cannot exclude high lateral ischemia. No significant change from 11/18/2015.   Expand All Collapse All   CARDIOLOGY CONSULT NOTE  Patient ID: Patrick [name] MRN: [id number] DOB/AGE: 12/25/1955 61 y.o.  Admit date: 11/17/2015 Referring Physician Bonnielee Haff, MD Primary Physician: Elyn Peers, MD Reason for Consultation Chest pain  HPI: Redge Hartt is a 60 y.o. 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  Patient describes this as heaviness to tightness in the middle of the chest  with questionable left Arm radiation especially when he laid down in bed. Chest pain got better when he sat up. He also experienced dyspnea with exertion activity recently and also noticed orthopnea on the day of chest pain. He felt well as soon as he sat up with no recurrence. He went to the dialysis center and mentioned that he had chest pain the previous night, patient was transferred to the hospital for further evaluation. He states that he has not had any further episodes of chest pain since being in the hospital. He underwent dialysis without any complications or chest pain. Presently he states that he is doing well and denies any specific complaints.  Patient on questioning also complains of pain and cramping in his legs left worse than the right with activity. He is also noticed significant weakness in both lower extremities with activity. No ulceration or bruise discoloration. He has prior history of tobacco use disorder quit many years ago.  He was also found to be severely anemic for which he received blood transfusion. Denies any obvious GI bleed, dark stools or bloody stools. He does not recollect if he has ever had a GI workup.  Past Medical History  Diagnosis Date  . Hypertension   . Diabetes mellitus without complication ([place])   . End-stage renal failure with renal transplant ([place])   . Gout   . Impotence of organic origin   . Type I (juvenile type) diabetes mellitus with renal manifestations, not stated as uncontrolled   . Secondary hyperparathyroidism [facility])      Past Surgical History  Procedure Laterality Date  . Birth defect heart surgery  1996  . Kidney transplant  2009  . Appendectomy    . Av fistula placement Right 09/18/2014    Procedure: INSERTION OF ARTERIOVENOUS (AV) GORE-TEX GRAFT ARM USING 4-7MM X 45CM STRETCH GORE-TEX VASCULAR GRAFT; Surgeon: Rosetta Posner, MD; Location: [facility] OR; Service: Vascular; Laterality:  Right;     Family History  Problem Relation Age of Onset  . Hyperlipidemia Mother   . Hypertension Mother      Social History: Social History   Social History  . Marital Status: Married    Spouse Name: N/A  . Number of Children: N/A  . Years of Education: N/A   Occupational History  . Not on file.   Social History Main Topics  . Smoking status:  Former Smoker  . Smokeless tobacco: Not on file  . Alcohol Use: No  . Drug Use: No  . Sexual Activity: Not Currently    Birth Control/ Protection: None   Other Topics Concern  . Not on file   Social History Narrative     Prescriptions prior to admission  Medication Sig Dispense Refill Last Dose  . aspirin 81 MG tablet Take 81 mg by mouth daily.   11/16/2015 at Unknown time  . calcium carbonate (OS-CAL - DOSED IN MG OF ELEMENTAL CALCIUM) 1250 (500 Ca) MG tablet Take 1 tablet by mouth daily with breakfast.   11/16/2015 at Unknown time  . carvedilol (COREG) 25 MG tablet Take 25 mg by mouth 2 (two) times daily with a meal.   11/16/2015 at 0800  . colchicine 0.6 MG tablet Take 1 tablet (0.6 mg total) by mouth daily. 30 tablet 0 11/16/2015 at Unknown time  . repaglinide (PRANDIN) 0.5 MG tablet Take 1 tablet (0.5 mg total) by mouth 2 (two) times daily before a meal. 60 tablet 11 11/16/2015 at Unknown time  . calcitRIOL (ROCALTROL) 0.5 MCG capsule Take 3 capsules (1.5 mcg total) by mouth daily. (Patient not taking: Reported on 10/30/2015) 90 capsule 0 Not Taking at Unknown time   ROS: General: no fevers/chills/night sweats Eyes: no blurry vision, diplopia, or amaurosis ENT: no sore throat or hearing loss Resp: dyspnea on exertion present no hemoptysis CV: chest pain present without recurrence. No dizziness, palpitations. GI: no abdominal pain, nausea, vomiting, diarrhea, or constipation GU: no dysuria, frequency, or  hematuria Skin: no rash Neuro: no headache, numbness, tingling, or weakness of extremities Musculoskeletal: weakness in both the lower extremity is with activity, cramping in the legs with activity. Heme: no bleeding, DVT, or easy bruising Endo: no polydipsia or polyuria   Physical Exam: Blood pressure 154/95, pulse 110, temperature 98.6 F (37 C), temperature source Oral, resp. rate 22, height 5\' 6"  (1.676 m), weight 97.977 kg (216 lb), SpO2 100 %.   General appearance: alert, cooperative, appears older than stated age and no distress Lungs: clear to auscultation bilaterally Heart: regular rate and rhythm, S1, S2 normal, no murmur, click, rub or gallop Abdomen: soft, non-tender; bowel sounds normal; no masses, no organomegaly and llarge incisional hernia at the previously transplanted kidney site present. Extremities: extremities normal, atraumatic, no cyanosis or edema and Skin appears to have chronic ischemia with loss of hair and shiny skin. Pulses: carotid arteries normal, bilateral femoral arteries one to 2+ without bruit. Popliteal pulses absent pedal pulses absent bilaterally. Right brachial AV fistula present, functioning. Previous right forearm and left forearm fistula present, nonfunctioning. Neurologic: Grossly normal  Labs:  CBC Latest Ref Rng 01/22/2016 01/22/2016 11/23/2015  WBC 4.0 - 10.5 K/uL 5.7 8.6 4.8  Hemoglobin 13.0 - 17.0 g/dL 9.0(L) 5.5(LL) 8.3(L)  Hematocrit 39.0 - 52.0 % 30.6(L) 18.1(L) 27.7(L)  Platelets 150 - 400 K/uL 190 213 183    Recent Labs Lab 11/18/15 0617  NA 137  136  K 4.3  4.0  CL 100*  99*  CO2 23  24  BUN 14  14  CREATININE 7.31*  7.25*  CALCIUM 7.3*  7.3*  GLUCOSE 97  98    Recent Labs (within last 365 days)     Recent Labs  10/30/15 0855 11/17/15 0759  BNP 2633.8* 2608.9*      HEMOGLOBIN A1C  Recent Labs    Lab Results  Component Value Date   HGBA1C 5.4 10/11/2015   MPG  108  10/11/2015      Recent Labs (within last 365 days)     Recent Labs  11/17/15 1526 11/17/15 1817  TROPONINI 0.12* 0.12*     EKG 11/18/2015: Normal sinus rhythm/sinus tachycardia at the rate of 112 bpm, LVH with repolarization abnormality, cannot exclude a lateral ischemia. Nonspecific T abnormality. PVC. Compared to 10/30/2015, sinus tachycardia new.  Echocardiogram. 11/29/2013 Left ventricle: The cavity size was normal. Wall thickness was increased in a pattern of mild LVH. Systolic function was normal. The estimated ejection fraction was in the range of 50% to 55%. Wall motion was normal; there were no regional wall motion abnormalities. Doppler parameters are consistent with abnormal left ventricular relaxation (grade 1 diastolic dysfunction). Mitral valve: Calcified annulus. Mildly thickened leafletsLeft atrium: The atrium was mildly dilated.  Lexiscan myoview stress test 01/08/2015: 1. The resting electrocardiogram demonstrated normal sinus rhythm and no resting arrhythmias. Non specific T inversion high lateral leads. Stress EKG is non-diagnostic for ischemia as it a pharmacologic stress using Lexiscan. Stress symptoms included dyspnea. 2. The perfusion imaging study demonstrates a very small-sized moderate ischemia in the basal inferior, inferoapical and apical septal region. Left ventricle systolic function was preserved with an ejection fraction of 55%. This is an abnormal stress test, however low risk due to size of the defect.        Radiology: Dg Chest 2 View  01/22/2016  CLINICAL DATA:  Shortness of breath and vomiting. Chronic renal failure EXAM: CHEST  2 VIEW COMPARISON:  November 17, 2015 chest radiograph and November 23, 2015 chest CT FINDINGS: There is a small left pleural effusion. There is mild left base atelectasis. Lungs elsewhere clear. Heart is enlarged with pulmonary vascularity within normal limits. Patient is status post median sternotomy. There is a stent in  the right innominate vein region. No adenopathy. No bone lesions. IMPRESSION: Stable cardiomegaly. Small left effusion with left base atelectasis. Lungs elsewhere clear. Electronically Signed   By: Lowella Grip III M.D.   On: 01/22/2016 07:04   Nm Pulmonary Perf And Vent  01/22/2016  CLINICAL DATA:  Acute onset shortness of breath and chest pain. End-stage renal disease on dialysis. EXAM: NUCLEAR MEDICINE VENTILATION - PERFUSION LUNG SCAN TECHNIQUE: Ventilation images were obtained in multiple projections using inhaled aerosol Tc-72m DTPA. Perfusion images were obtained in multiple projections after intravenous injection of Tc-74m MAA. RADIOPHARMACEUTICALS:  31.0 mCi Technetium-30m DTPA aerosol inhalation and 4.2 mCi Technetium-65m MAA IV COMPARISON:  Chest radiograph on 01/22/2016 FINDINGS: Ventilation: Diffusely heterogeneous distribution of radiopharmaceutical throughout both lungs, which is nonspecific. Perfusion: No wedge shaped peripheral perfusion defects to suggest acute pulmonary embolism. IMPRESSION: No evidence of pulmonary embolism. Heterogeneous ventilatory activity noted throughout both lungs, nonspecific. Electronically Signed   By: Earle Gell M.D.   On: 01/22/2016 16:20    Scheduled Meds: . amLODipine  2.5 mg Oral Daily  . antiseptic oral rinse  7 mL Mouth Rinse BID  . aspirin EC  81 mg Oral Daily  . atorvastatin  10 mg Oral q1800  . [START ON 01/23/2016] calcium carbonate  1 tablet Oral Q breakfast  . carvedilol  25 mg Oral BID WC  . colchicine  0.6 mg Oral Q14 Days  . darbepoetin (ARANESP) injection - DIALYSIS  200 mcg Intravenous Q Tue-HD  . insulin aspart  0-5 Units Subcutaneous QHS  . insulin aspart  0-9 Units Subcutaneous TID WC  . isosorbide mononitrate  30 mg Oral Daily  . sodium chloride flush  3 mL Intravenous  Q12H   Continuous Infusions:  PRN Meds:.acetaminophen, ipratropium-albuterol, ondansetron **OR** ondansetron (ZOFRAN) IV  ASSESSMENT AND PLAN:  1. NSTEMI  secondary to demand ischemia from severe acute anemia, hemoglobin 5.5/hematocrit 18.1, down from 8.3/27.7 on 11/23/2015. 2. 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 3. History of NSVT in 2015 admission, no recurrence on telemetry this admission. 4. H/O Renal transplant and rejection, presently back on HD. Underwent transplant nephrectomy and abdominal wall hernia last month at [facility] 5. IDDM (type I) controlled without hyperglycemia. 6. PAD with symptoms of claudication in bilateral lower extremities, no acute limb threatening ischemia. 7. Hypertension. 8. Severe anemia of chronic disease, specifically renal failure 9. Acute on chronic diastolic heart failure  Recommendation: Patient denies any form of chest pain. Also the fact that he is severely anemic, would not recommend anticoagulation, would continue aspirin for now. This is a second episode of severe anemia, also when he presented in April 2017, he was severely anemic. Etiology for anemia needs to be worked up. From cardiac standpoint he is in acute decompensated diastolic heart failure, may end up needing repeat hemodialysis tomorrow, however I will defer this to nephrology. I have discussed with the patient regarding avoidance of salt and excess amount of fluids. Otherwise he is on appropriate medical therapy for now, not on ACE inhibitor or ARB due to renal failure and risk of hyperkalemia.  Patrick Prows, MD 01/22/2016, 6:28 PM [facility]. [place] Pager: [phone] Office: [phone] If no answer Cell [phone]

## 2016-01-22 NOTE — Care Management Note (Signed)
Case Management Note  Patient Details  Name: Patrick [name] MRN: [id number] Date of Birth: 1956-01-14  Subjective/Objective:                  59 y.o. male with history of ESRD (T/Th/Sat dialysis), DM, HTN who presents to the ED for evaluation of shortness of breath. He states his symptoms started around 11PM last night and have been persistent and constant. /From home with spouse.  Action/Plan: Follow for disposition needs.   Expected Discharge Date:  01/25/16               Expected Discharge Plan:  [place]  In-House Referral:  NA  Discharge planning Services  CM Consult  Post Acute Care Choice:    Choice offered to:     DME Arranged:    DME Agency:     HH Arranged:    HH Agency:     Status of Service:  In process, will continue to follow  If discussed at Long Length of Stay Meetings, dates discussed:    Additional Comments:  Fuller Mandril, RN 01/22/2016, 9:15 AM

## 2016-01-22 NOTE — Procedures (Signed)
  I was present at this dialysis session, have reviewed the session itself and made  appropriate changes Kelly Splinter MD [place] pager [phone]    cell [phone] 01/22/2016, 2:41 PM

## 2016-01-22 NOTE — ED Notes (Signed)
Blood consent form signed by patient.

## 2016-01-22 NOTE — ED Notes (Signed)
Discussed HgB and Troponin results with Sam, PA.

## 2016-01-22 NOTE — ED Notes (Signed)
Pt completed VQ Scan. Pt started to rush to the bathroom. Demanded all equipment be unhooked so he could get to the bathroom including oxygen and wouldn't wait for this RN to transfer oxygen. Pt went to the bathroom. When placed back on the monitor, pt was 59% on RA. Pt placed on 6L. Pt returned to 100% after a few minutes. Pt currently 97% on 2L.

## 2016-01-22 NOTE — ED Notes (Signed)
Gave report to Joy, Therapist, sports, dialysis.  She advised not to worry about additional orders, do not give any medications before dialysis.   She advised would take care of everything else there.  Once patient returns from VQ scan, have taken to dialysis.

## 2016-01-22 NOTE — Progress Notes (Signed)
Pt ordered to have 2 units PRBCs. When giving first unit noticed that there was an order to transfuse 4 units. Notified MD and a verbal order was given to only transfuse 2 units PRBCs and to discontinue other order. 1 unit already released from flowsheets and started transfusing. Deleted one of the transfuse orders and it was the order that was already being charted on. At this point there is an order for 2 units with one transfusing but the doc flowsheets shows that 2 units needs to be released and given. Notified blood bank and will report off to nightshift RN.

## 2016-01-22 NOTE — H&P (Addendum)
History and Physical    Patrick [name] [id number] DOB: [birth year] DOA: 01/22/2016  PCP: Elyn Peers, MD Patient coming from: home  Chief Complaint: SOB and CP  HPI: Patrick [name] is a 60 y.o. 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    Patient reports that at 2300 on 01/21/2016 he developed acute onset shortness of breath. This is been associated with left chest tightness and intermittent nausea with exertion. Patient states that he is unable to lie down as this makes the shortness of breath worse additionally a gets worse with exertion. Patient had single episode of emesis this morning during exertion. Patient denies any dark tarry stools, hematochezia, hematemesis, abdominal swelling, fevers, rash, back pain, neck stiffness, headache. States that he had mild cough 1 day ago that was nonproductive but this has subsequently resolved. Patient states that at his last nephrology appointment 2 weeks ago his hemoglobin was 8.5  Report patient reports having a failed kidney transplant which was subsequently removed in May along with a hernia repair at the same time. This was performed at [facility]. Patient reports he's recovered well since surgery but continues to wear his abdominal binder per surgical recommendations.   ED Course: ED initially concerned for sepsis and started patient on vancomycin and Zosyn. Objective findings outlined below. ED has consult to cardiology and ordered blood transfusion.  Review of Systems: As per HPI otherwise 10 point review of systems negative.   Ambulatory Status: no restrictions  Past Medical History  Diagnosis Date  . Hypertension   . Diabetes mellitus without complication ([place])   . End-stage renal failure with renal transplant ([place])   . Gout   . Impotence of organic origin   . Type I (juvenile type) diabetes mellitus with  renal manifestations, not stated as uncontrolled   . Secondary hyperparathyroidism [facility])     Past Surgical History  Procedure Laterality Date  . Birth defect heart surgery  1996  . Kidney transplant  2009  . Appendectomy    . Av fistula placement Right 09/18/2014    Procedure: INSERTION OF ARTERIOVENOUS (AV) GORE-TEX GRAFT ARM USING 4-7MM  X 45CM STRETCH GORE-TEX VASCULAR GRAFT;  Surgeon: Rosetta Posner, MD;  Location: [facility] OR;  Service: Vascular;  Laterality: Right;    Social History   Social History  . Marital Status: Married    Spouse Name: N/A  . Number of Children: N/A  . Years of Education: N/A   Occupational History  . Not on file.   Social History Main Topics  . Smoking status: Former Research scientist (life sciences)  . Smokeless tobacco: Not on file  . Alcohol Use: No  . Drug Use: No  . Sexual Activity: Not Currently    Birth Control/ Protection: None   Other Topics Concern  . Not on file   Social History Narrative    Allergies  Allergen Reactions  . Iodinated Diagnostic Agents Rash    Other Reaction: burning to mouth, swelling of lips.    Family History  Problem Relation Age of Onset  . Hyperlipidemia Mother   . Hypertension Mother     Prior to Admission medications   Medication Sig Start Date End Date Taking? Authorizing Provider  amLODipine (NORVASC) 2.5 MG tablet Take 2.5 mg by mouth daily.   Yes Historical Provider, MD  aspirin 81 MG tablet Take 81 mg by mouth daily.   Yes Historical Provider, MD  calcium carbonate (OS-CAL - DOSED IN MG OF ELEMENTAL CALCIUM) 1250 (500 Ca) MG tablet Take 1 tablet by mouth daily with breakfast.   Yes Historical Provider, MD  carvedilol (COREG) 25 MG tablet Take 25 mg by mouth 2 (two) times daily with a meal.   Yes Historical Provider, MD  ipratropium-albuterol (DUONEB) 0.5-2.5 (3) MG/3ML SOLN Take 3 mLs by nebulization every 4 (four) hours as needed. 11/23/15  Yes Theodis Blaze, MD  isosorbide mononitrate (IMDUR) 30 MG 24 hr tablet Take 30 mg by  mouth daily.   Yes Historical Provider, MD  atorvastatin (LIPITOR) 10 MG tablet Take 1 tablet (10 mg total) by mouth daily at 6 PM. 11/23/15   Theodis Blaze, MD  colchicine 0.6 MG tablet Take 0.5 tablets (0.3 mg total) by mouth daily. Patient taking differently: Take 0.6 mg by mouth every 14 (fourteen) days.  11/23/15   Theodis Blaze, MD  isosorbide mononitrate (IMDUR) 60 MG 24 hr tablet Take 1 tablet (60 mg total) by mouth daily. 11/23/15   Theodis Blaze, MD    Physical Exam: Filed Vitals:   01/22/16 0845 01/22/16 0900 01/22/16 0915 01/22/16 0949  BP: 123/82 155/98 101/80 121/72  Pulse: 107 117 106 108  Temp:      TempSrc:      Resp: 29 36 40 38  Height:      Weight:      SpO2: 98% 96% 96% 97%     General: Appears to be in mild distress resting in bed. Eyes:  PERRL, EOMI, normal lids, iris ENT:  grossly normal hearing, lips & tongue, mmm Neck:  no LAD, masses or thyromegaly Cardiovascular:  RRR, III/VI systolic murmur. 1+ LE edema bilat, RUE fistula patent w/ strong bruit Respiratory: Diminished in bases w/ few crackles. Tachynea w/ increased effort.  Abdomen:  soft, ntnd, NABS Skin:  no rash or induration seen on limited exam Musculoskeletal:  grossly normal tone BUE/BLE, good ROM, no bony abnormality Psychiatric:  grossly normal mood and affect, speech fluent and appropriate, AOx3 Neurologic:  CN 2-12 grossly intact, moves all extremities in coordinated fashion, sensation intact  Labs on Admission: I have personally reviewed following labs and imaging studies  CBC:  Recent Labs Lab 01/22/16 0735  WBC 8.6  NEUTROABS 5.5  HGB 5.5*  HCT 18.1*  MCV 85.0  PLT 123456   Basic Metabolic Panel:  Recent Labs Lab 01/22/16 0735  NA 136  K 5.3*  CL 98*  CO2 22  GLUCOSE 84  BUN 47*  CREATININE 12.73*  CALCIUM 9.3   GFR: Estimated Creatinine Clearance: 6.5 mL/min (by C-G formula based on Cr of 12.73). Liver Function Tests:  Recent Labs Lab 01/22/16 0735  AST 24  ALT  10*  ALKPHOS 62  BILITOT 1.0  PROT 7.2  ALBUMIN 3.2*   No results for input(s): LIPASE, AMYLASE in the last 168 hours. No results for input(s): AMMONIA in the last 168 hours. Coagulation Profile: No results for input(s): INR, PROTIME in the last 168 hours. Cardiac Enzymes: No results for input(s): CKTOTAL, CKMB, CKMBINDEX, TROPONINI in the last 168 hours. BNP (last 3 results) No results for input(s): PROBNP in the last 8760 hours. HbA1C: No results for input(s): HGBA1C in the last 72 hours. CBG: No results for input(s): GLUCAP in the last 168 hours. Lipid Profile: No results for input(s): CHOL, HDL, LDLCALC, TRIG, CHOLHDL, LDLDIRECT in the last 72 hours. Thyroid Function Tests: No results for input(s): TSH, T4TOTAL, FREET4, T3FREE, THYROIDAB  in the last 72 hours. Anemia Panel: No results for input(s): VITAMINB12, FOLATE, FERRITIN, TIBC, IRON, RETICCTPCT in the last 72 hours. Urine analysis:    Component Value Date/Time   COLORURINE RED* 11/17/2015 0910   APPEARANCEUR TURBID* 11/17/2015 0910   LABSPEC 1.035* 11/17/2015 0910   PHURINE 6.0 11/17/2015 0910   GLUCOSEU 100* 11/17/2015 0910   HGBUR LARGE* 11/17/2015 0910   BILIRUBINUR LARGE* 11/17/2015 0910   KETONESUR 40* 11/17/2015 0910   PROTEINUR >300* 11/17/2015 0910   UROBILINOGEN 0.2 11/24/2013 0242   NITRITE POSITIVE* 11/17/2015 0910   LEUKOCYTESUR LARGE* 11/17/2015 0910    Creatinine Clearance: Estimated Creatinine Clearance: 6.5 mL/min (by C-G formula based on Cr of 12.73).  Sepsis Labs: @LABRCNTIP (procalcitonin:4,lacticidven:4) )No results found for this or any previous visit (from the past 240 hour(s)).   Radiological Exams on Admission: Dg Chest 2 View  01/22/2016  CLINICAL DATA:  Shortness of breath and vomiting. Chronic renal failure EXAM: CHEST  2 VIEW COMPARISON:  November 17, 2015 chest radiograph and November 23, 2015 chest CT FINDINGS: There is a small left pleural effusion. There is mild left base  atelectasis. Lungs elsewhere clear. Heart is enlarged with pulmonary vascularity within normal limits. Patient is status post median sternotomy. There is a stent in the right innominate vein region. No adenopathy. No bone lesions. IMPRESSION: Stable cardiomegaly. Small left effusion with left base atelectasis. Lungs elsewhere clear. Electronically Signed   By: Lowella Grip III M.D.   On: 01/22/2016 07:04    Assessment/Plan Active Problems:   End-stage renal failure with renal transplant (HCC)   Chest pain   S/P CABG x 1   Symptomatic anemia   Acute respiratory failure (HCC)   Acute diastolic (congestive) heart failure (HCC)   Elevated troponin   Febrile illness   Acute respiratory failure: Likely multifactorial from CHF exacerbation and possible pulmonary embolus. Patient able to maintain O2 saturations of 92% but with a markedly increased effort. O2 provided with little improvement in tachypnea. BNP greater than 4500 that this number somewhat skewed given the fact that he is ESRD. - Continue O2 - VQ scan (patient with contrast allergy and unable to get appropriate gauge IV, making CTA less ideal) - Emergent dialysis as outlined below to hopefully take patient fluid negative - Strict I's and O's, daily weights, repeat echo  Anemia: acute on chronic. Hgb 5.5, baseline 8. FOBT Negative. No h/o GI loss. Suspect from renal disease w/ possible intermittent GI loss. Per family, pt stopped Epo a couple of weeks ago. - 2 untis PRBC in dialysis - f/u CBC - repeat FOBT - Consider restarting Epo - per renal  Elevated troponin: 3.53 on admission. Pt w/ baseline elevation of 0.1-0.2. Concern for MI though EKG w/o direct evidence of ACS. Pt states he was told by his cardiologist he needed a cath due to concern for occlusive CAD. Suspect elevation from ESRD adn demand from anemia.  - PRBC - Eval per cards - consulted by EDP - cycle trop - Tele - no Hep due to enamia - ASA, Statin  ESRD:  t/th/sat.  - Dialysis per Nephrology  DM: - SSI - A1c  Febrile illness: mild and possible reactionary. WBC and lactic acid nml. No focal source. Vanc and Zosyn given in ED - [place] - Repeat CXR in am  HTN: - continue norvasc, Imdur, Coreg  DVT prophylaxis: SCD  Code Status: FULL  Family Communication: none  Disposition Plan: pending improvement and workup  Consults  called: Cards, Nephrology  Admission status: Inpatient - SDU    MERRELL, DAVID J MD [facility]  If 7PM-7AM, please contact night-coverage [email or web address] Password [facility]  01/22/2016, 9:51 AM

## 2016-01-22 NOTE — Consult Note (Signed)
Renal Service Consult Note [facility]  Patrick [name] 01/22/2016 [place] Requesting Physician:  Dr Venora Maples  Reason for Consult:  ESRD pt with anemia, SOB and chest pain HPI: The patient is a 60 y.o. year-old with hx of longstanding HTN, failed renal Tx, CAD hx CABG '96, DM2 , gout and ESRD on HD TTS at [facility].  Came to ED with SOB and CP less than 24 hrs duration.  In ED had HR 130, normal BP, 95% on RA, CBG 121.  SOB w exertion was primary c/o.  Hb was low at 5.5, K ok.  CXR poss vasc congestion. Trop 3.53.  Normal LA and WBC.  Admitted to hosp service, asked to see for HD.    Patient was here in April with chest pain and demand ischemia.  He also had gross hematuria and a renal stone obstructing his transplant kidney ureter.  Already on HD.  He was transferred to [facility] for the ureteral stone.    Patient denies any GI bleeding recently, melena,  Hematochezia or other bleeding.  Got prbc while here in April as well.    STarted HD w ESRD from HTN in 2002.  Got cadaveric renal tx at [facility] in 2009, lasted until Feb 2016 when he went back on HD.  Is on his 3rd perm access in the R upper arm, no recent issues.   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  Then had ESRD in 2002 and went on disability.  Started w PD and tried to keep working but got peritonitis and switched to HD. Lives in [place], married 25 yrs w grown children.  Likes to fish and play baseball.     ROS  denies CP  no joint pain   no HA  no blurry vision  no rash  no diarrhea  no nausea/ vomiting  no dysuria  no difficulty voiding  no change in urine color    Past Medical History  Past Medical History  Diagnosis Date  . Hypertension   . Diabetes mellitus without complication ([place])   . End-stage renal failure with renal transplant ([place])   . Gout   . Impotence of organic origin   . Type I (juvenile type) diabetes mellitus with renal manifestations, not  stated as uncontrolled   . Secondary hyperparathyroidism [facility])    Past Surgical History  Past Surgical History  Procedure Laterality Date  . Birth defect heart surgery  1996  . Kidney transplant  2009  . Appendectomy    . Av fistula placement Right 09/18/2014    Procedure: INSERTION OF ARTERIOVENOUS (AV) GORE-TEX GRAFT ARM USING 4-7MM  X 45CM STRETCH GORE-TEX VASCULAR GRAFT;  Surgeon: Rosetta Posner, MD;  Location: [facility] OR;  Service: Vascular;  Laterality: Right;   Family History  Family History  Problem Relation Age of Onset  . Hyperlipidemia Mother   . Hypertension Mother    Social History  reports that he has quit smoking. He does not have any smokeless tobacco history on file. He reports that he does not drink alcohol or use illicit drugs. Allergies  Allergies  Allergen Reactions  . Iodinated Diagnostic Agents Rash    Other Reaction: burning to mouth, swelling of lips.   Home medications Prior to Admission medications   Medication Sig Start Date End Date Taking? Authorizing Provider  amLODipine (NORVASC) 2.5 MG tablet Take 2.5 mg by mouth daily.   Yes Historical Provider, MD  aspirin 81 MG tablet Take 81 mg by mouth daily.   Yes Historical Provider, MD  calcium carbonate (OS-CAL - DOSED IN MG OF ELEMENTAL CALCIUM) 1250 (500 Ca) MG tablet Take 1 tablet by mouth daily with breakfast.   Yes Historical Provider, MD  carvedilol (COREG) 25 MG tablet Take 25 mg by mouth 2 (two) times daily with a meal.   Yes Historical Provider, MD  ipratropium-albuterol (DUONEB) 0.5-2.5 (3) MG/3ML SOLN Take 3 mLs by nebulization every 4 (four) hours as needed. 11/23/15  Yes Theodis Blaze, MD  isosorbide mononitrate (IMDUR) 30 MG 24 hr tablet Take 30 mg by mouth daily.   Yes Historical Provider, MD  atorvastatin (LIPITOR) 10 MG tablet Take 1 tablet (10 mg total) by mouth daily at 6 PM. 11/23/15   Theodis Blaze, MD  colchicine 0.6 MG tablet Take 0.5 tablets (0.3 mg total) by mouth daily. Patient taking  differently: Take 0.6 mg by mouth every 14 (fourteen) days.  11/23/15   Theodis Blaze, MD  isosorbide mononitrate (IMDUR) 60 MG 24 hr tablet Take 1 tablet (60 mg total) by mouth daily. 11/23/15   Theodis Blaze, MD   Liver Function Tests  Recent Labs Lab 01/22/16 0735  AST 24  ALT 10*  ALKPHOS 62  BILITOT 1.0  PROT 7.2  ALBUMIN 3.2*   No results for input(s): LIPASE, AMYLASE in the last 168 hours. CBC  Recent Labs Lab 01/22/16 0735  WBC 8.6  NEUTROABS 5.5  HGB 5.5*  HCT 18.1*  MCV 85.0  PLT 123456   Basic Metabolic Panel  Recent Labs Lab 01/22/16 0735 01/22/16 0828  NA 136  --   K 5.3*  --   CL 98*  --   CO2 22  --   GLUCOSE 84  --   BUN 47*  --   CREATININE 12.73*  --   CALCIUM 9.3  --   PHOS  --  3.8   Iron/TIBC/Ferritin/ %Sat    Component Value Date/Time   IRON 157* 09/15/2014 1621   TIBC NOT CALC 09/15/2014 1621   FERRITIN 1048* 09/15/2014 1621   IRONPCTSAT NOT CALC 09/15/2014 1621    Filed Vitals:   01/22/16 1230 01/22/16 1300 01/22/16 1330 01/22/16 1400  BP: 114/73 123/90 140/90 131/93  Pulse: 102 104 100 99  Temp:      TempSrc:      Resp:      Height:      Weight:      SpO2:       Exam Gen alert, a little dyspneic when speaking, no distress, on HD No rash, cyanosis or gangrene Sclera anicteric, throat clear  +JVD no bruits Chest clear bilat to bases RRR no MRG heard Abd soft ntnd obese, RLQ tx nontender, +bs no ascites GU normal male MS no joint effusions or deformity Ext no LE or UE edema / old accesses L arm and R arm RUA AVG+bruit, no LE wounds or ulcers Neuro is alert, Ox 3 , nf    Dialysis: [facility]  4h  90.5kg   2/2.5 bath  P4  Hep 1600  RUA AVG Venof 50/wk Mircera 225 next due 6/27  Assessment: 1  Dyspnea/ chest pain - w/u in progress, V/Q in progress, severe anemia and poss early CHF 2  Vol - CXR suggestive poss early vol excess, 2-3 kg over dry wt 3  Anemia - unclear cause, getting ESA and Fe w OP HD 4  Hx failed renal  Tx 5  ESRD HD TTS 6  DM not on any diabetic meds 7  HTN on coreg/ norvasc   Plan - recommend getting prbc's while on HD w possible vol overload issue today.  HD today, on machine now.  V/Q results pending. Anemia w/u.    Kelly Splinter MD [employer] pager [phone]    cell [phone] 01/22/2016, 2:21 PM

## 2016-01-22 NOTE — Progress Notes (Signed)
Pharmacy Antibiotic Note  Patrick [name] is a 60 y.o. male admitted on 01/22/2016 with sepsis.  Pharmacy has been consulted for vanc/zosyn dosing. ESRD - HD TTS pta. Tmax/24h 100.6, labs pending.  Code sepsis called at 0650. Spoke with EDP and no abx warranted at this time. Then abx ordered at 0746, delivered at 0750, started 0752. MD to cancel code sepsis.  Plan: Zosyn 45min inf x1; then 2.25g IV q8h Vanc 2g load (1g + 1g) in ED Monitor clinical progress, c/s, abx plan/LOT Pre-HD VR as indicated F/u HD schedule inpatient for vanc maintenance dosing  Height: 5' 6.5" (168.9 cm) Weight: 202 lb (91.627 kg) IBW/kg (Calculated) : 64.95  Temp (24hrs), Avg:100.4 F (38 C), Min:100.1 F (37.8 C), Max:100.6 F (38.1 C)   Recent Labs Lab 01/22/16 0742  LATICACIDVEN 1.22    CrCl cannot be calculated (Patient has no serum creatinine result on file.).    Allergies  Allergen Reactions  . Iodinated Diagnostic Agents Rash    Other Reaction: burning to mouth, swelling of lips.    Antimicrobials this admission: 6/26 vanc >>  6/26 zosyn >>   Dose adjustments this admission:   Microbiology results: 6/26 BCx:  6/26 UCx:      Elicia Lamp, PharmD, BCPS Clinical Pharmacist Pager [phone] 01/22/2016 8:03 AM\

## 2016-01-22 NOTE — ED Notes (Signed)
Critical Troponin 3.50.  Admitting aware-consistent with previous value.

## 2016-01-22 NOTE — ED Notes (Signed)
PER [facility]: Patient to ED from home c/o SOB since 2300 last night. Patient is dialysis pt, last one Saturday - was headed out to his appointment this morning when the SOB worsened. EMS VS: 130 ST, 157/92, 95% RA, placed on 2L O2 [place] for comfort, CBG 121. Patient A&O x 4, respirations e/u at this time.

## 2016-01-22 NOTE — ED Notes (Addendum)
Contacted VQ scan to see if they have an RN to come and go with patient to VQ scan.   They will not have an RN available for 2 hours. Charge advised.

## 2016-01-22 NOTE — ED Notes (Signed)
Only 1 IV access per IV team.

## 2016-01-22 NOTE — ED Notes (Signed)
Patient going to dialysis.  Transporting with Therapist, sports.

## 2016-01-22 NOTE — ED Notes (Signed)
Per Sam, [place] and pharmacy, no fluids to be given to patient because he doesn't make urine, he is to go to dialysis, kidney transplant.

## 2016-01-22 NOTE — Progress Notes (Signed)
Pt receiving blood transfusion, pre temp was 98.1 oral, after 15 min temp was 99.7. RN checked temp again after another 15 min, it was 99.1. Dr.Kirby paged, orders to give Tylenol and to continue infusion since all other vitals are stable and pt states he feels fine with no changes. Will continue to monitor

## 2016-01-22 NOTE — ED Notes (Signed)
Patient states doesn't make urine.   Unable to collect urine from patient.

## 2016-01-22 NOTE — ED Notes (Signed)
Pt returned to room from xray.

## 2016-01-22 NOTE — ED Notes (Signed)
Verified with both pharmacy and Sam, [place] that patient was to receive 2nd bag of vanc.

## 2016-01-23 ENCOUNTER — Other Ambulatory Visit (HOSPITAL_COMMUNITY): Payer: Medicare Other

## 2016-01-23 ENCOUNTER — Inpatient Hospital Stay (HOSPITAL_COMMUNITY): Payer: Medicare Other

## 2016-01-23 DIAGNOSIS — J9600 Acute respiratory failure, unspecified whether with hypoxia or hypercapnia: Secondary | ICD-10-CM

## 2016-01-23 DIAGNOSIS — R7989 Other specified abnormal findings of blood chemistry: Secondary | ICD-10-CM

## 2016-01-23 DIAGNOSIS — R509 Fever, unspecified: Secondary | ICD-10-CM

## 2016-01-23 LAB — TYPE AND SCREEN
ABO/RH(D): O POS
ANTIBODY SCREEN: POSITIVE
DAT, IGG: NEGATIVE
UNIT DIVISION: 0
Unit division: 0

## 2016-01-23 LAB — CBC
HEMATOCRIT: 31.7 % — AB (ref 39.0–52.0)
HEMOGLOBIN: 9.5 g/dL — AB (ref 13.0–17.0)
MCH: 26 pg (ref 26.0–34.0)
MCHC: 30 g/dL (ref 30.0–36.0)
MCV: 86.6 fL (ref 78.0–100.0)
Platelets: 179 10*3/uL (ref 150–400)
RBC: 3.66 MIL/uL — ABNORMAL LOW (ref 4.22–5.81)
RDW: 17.7 % — AB (ref 11.5–15.5)
WBC: 5.3 10*3/uL (ref 4.0–10.5)

## 2016-01-23 LAB — GLUCOSE, CAPILLARY
GLUCOSE-CAPILLARY: 115 mg/dL — AB (ref 65–99)
GLUCOSE-CAPILLARY: 96 mg/dL (ref 65–99)
Glucose-Capillary: 98 mg/dL (ref 65–99)
Glucose-Capillary: 132 mg/dL — ABNORMAL HIGH (ref 65–99)

## 2016-01-23 LAB — COMPREHENSIVE METABOLIC PANEL
ALBUMIN: 2.8 g/dL — AB (ref 3.5–5.0)
ALK PHOS: 69 U/L (ref 38–126)
ALT: 8 U/L — ABNORMAL LOW (ref 17–63)
ANION GAP: 8 (ref 5–15)
AST: 27 U/L (ref 15–41)
BILIRUBIN TOTAL: 1.1 mg/dL (ref 0.3–1.2)
BUN: 21 mg/dL — AB (ref 6–20)
CALCIUM: 8 mg/dL — AB (ref 8.9–10.3)
CO2: 30 mmol/L (ref 22–32)
Chloride: 99 mmol/L — ABNORMAL LOW (ref 101–111)
Creatinine, Ser: 7.27 mg/dL — ABNORMAL HIGH (ref 0.61–1.24)
GFR calc Af Amer: 8 mL/min — ABNORMAL LOW (ref >60)
GFR, EST NON AFRICAN AMERICAN: 7 mL/min — AB (ref >60)
GLUCOSE: 111 mg/dL — AB (ref 65–99)
Potassium: 4.1 mmol/L (ref 3.5–5.1)
Sodium: 137 mmol/L (ref 135–145)
TOTAL PROTEIN: 6.8 g/dL (ref 6.5–8.1)

## 2016-01-23 LAB — HEMOGLOBIN A1C
HEMOGLOBIN A1C: 4.4 % — AB (ref 4.8–5.6)
MEAN PLASMA GLUCOSE: 80 mg/dL

## 2016-01-23 MED ORDER — HEPARIN SODIUM (PORCINE) 1000 UNIT/ML DIALYSIS
1600.0000 [IU] | Freq: Once | INTRAMUSCULAR | Status: AC
  Administered 2016-01-23: 1600 [IU] via INTRAVENOUS_CENTRAL

## 2016-01-23 MED ORDER — HEPARIN SODIUM (PORCINE) 1000 UNIT/ML DIALYSIS
20.0000 [IU]/kg | Freq: Once | INTRAMUSCULAR | Status: AC
  Administered 2016-01-24: 1800 [IU] via INTRAVENOUS_CENTRAL

## 2016-01-23 MED ORDER — SODIUM CHLORIDE 0.9 % IV SOLN: 100.0000 mL | INTRAVENOUS | Status: DC | PRN

## 2016-01-23 MED ORDER — HEPARIN SODIUM (PORCINE) 1000 UNIT/ML DIALYSIS: 1000.0000 [IU] | INTRAMUSCULAR | Status: DC | PRN

## 2016-01-23 MED ORDER — ALTEPLASE 2 MG IJ SOLR: 2.0000 mg | Freq: Once | INTRAMUSCULAR | Status: DC | PRN

## 2016-01-23 MED ORDER — LIDOCAINE HCL (PF) 1 % IJ SOLN
5.0000 mL | INTRAMUSCULAR | Status: DC | PRN
Start: 2016-01-23 — End: 2016-01-24

## 2016-01-23 MED ORDER — PENTAFLUOROPROP-TETRAFLUOROETH EX AERO
1.0000 | INHALATION_SPRAY | CUTANEOUS | Status: DC | PRN
Start: 2016-01-23 — End: 2016-01-23

## 2016-01-23 MED ORDER — LIDOCAINE-PRILOCAINE 2.5-2.5 % EX CREA
1.0000 "application " | TOPICAL_CREAM | CUTANEOUS | Status: DC | PRN
  Filled 2016-01-23: qty 5

## 2016-01-23 MED ORDER — LIDOCAINE HCL (PF) 1 % IJ SOLN: 5.0000 mL | INTRAMUSCULAR | Status: DC | PRN

## 2016-01-23 MED ORDER — PENTAFLUOROPROP-TETRAFLUOROETH EX AERO: 1.0000 "application " | INHALATION_SPRAY | CUTANEOUS | Status: DC | PRN

## 2016-01-23 MED ORDER — PIPERACILLIN-TAZOBACTAM IN DEX 2-0.25 GM/50ML IV SOLN
2.2500 g | Freq: Three times a day (TID) | INTRAVENOUS | Status: DC
  Administered 2016-01-23 – 2016-01-26 (×9): 2.25 g via INTRAVENOUS
  Filled 2016-01-23 (×12): qty 50

## 2016-01-23 MED ORDER — LIDOCAINE-PRILOCAINE 2.5-2.5 % EX CREA: 1.0000 "application " | TOPICAL_CREAM | CUTANEOUS | Status: DC | PRN

## 2016-01-23 NOTE — Progress Notes (Signed)
Subjective:  C/O dry cough and orthopnea. Denies chest pain  Objective:  Vital Signs in the last 24 hours: Temp:  [98 F (36.7 C)-100.3 F (37.9 C)] 98.2 F (36.8 C) (06/28 0734) Pulse Rate:  [74-118] 94 (06/28 0812) Resp:  [15-40] 27 (06/28 0812) BP: (101-155)/(69-99) 138/98 mmHg (06/28 0800) SpO2:  [94 %-100 %] 100 % (06/28 0812) Weight:  [82.3 kg (181 lb 7 oz)-92.4 kg (203 lb 11.3 oz)] 88.406 kg (194 lb 14.4 oz) (06/28 0645)  Intake/Output from previous day: 06/27 0701 - 06/28 0700 In: 27 [Blood:670] Out: 3193   Physical Exam: General appearance: alert, cooperative, appears older than stated age and no distress. JVD present. Lungs: clear to auscultation bilaterally Heart: Tachycardia present, regular rate and rhythm, S1, S2 normal, no murmur, click, rub or gallop Abdomen: soft, non-tender; bowel sounds normal; no masses, Liver just felt and tender and incision at the surgical site in the right iliac fossa (transplant nephrectomy) has healed well.  Extremities: extremities normal, atraumatic, no cyanosis or edema and Skin appears to have chronic ischemia with loss of hair and shiny skin. Pulses: carotid arteries normal, bilateral femoral arteries one to 2+ without bruit. Popliteal pulses absent pedal pulses absent bilaterally. Right brachial AV fistula present, functioning. Previous right forearm and left forearm fistula present, nonfunctioning. Neurologic: Grossly normal  Lab Results: BMP  Recent Labs  11/23/15 0506 01/22/16 0735 01/23/16 0320  NA 137 136 137  K 4.0 5.3* 4.1  CL 97* 98* 99*  CO2 28 22 30   GLUCOSE 94 84 111*  BUN 14 47* 21*  CREATININE 7.68* 12.73* 7.27*  CALCIUM 7.1* 9.3 8.0*  GFRNONAA 7* 4* 7*  GFRAA 8* 4* 8*    CBC  Recent Labs Lab 01/22/16 0735  01/23/16 0320  WBC 8.6  < > 5.3  RBC 2.13*  < > 3.66*  HGB 5.5*  < > 9.5*  HCT 18.1*  < > 31.7*  PLT 213  < > 179  MCV 85.0  < > 86.6  MCH 25.8*  < > 26.0  MCHC 30.4  < > 30.0  RDW  18.3*  < > 17.7*  LYMPHSABS 2.3  --   --   MONOABS 0.7  --   --   EOSABS 0.1  --   --   BASOSABS 0.1  --   --   < > = values in this interval not displayed.  HEMOGLOBIN A1C Lab Results  Component Value Date   HGBA1C 5.4 10/11/2015   MPG 108 10/11/2015    Cardiac Panel (last 3 results)  Recent Labs  01/22/16 0828 01/22/16 1720 01/22/16 2236  TROPONINI 3.50* 5.01* 4.49*    Recent Labs  11/19/15 0441 11/20/15 0806  TSH 7.292* 9.512*   Recent Labs  10/25/15 1704  11/23/15 0506 01/22/16 0735 01/23/16 0320  PROT 7.5  --   --  7.2 6.8  ALBUMIN 2.7*  < > 2.4* 3.2* 2.8*  AST 24  --   --  24 27  ALT 13*  --   --  10* 8*  ALKPHOS 49  --   --  62 69  BILITOT 0.5  --   --  1.0 1.1  < > = values in this interval not displayed.  Imaging: Dg Chest 2 View  01/22/2016  CLINICAL DATA:  Shortness of breath and vomiting. Chronic renal failure EXAM: CHEST  2 VIEW COMPARISON:  November 17, 2015 chest radiograph and November 23, 2015 chest CT FINDINGS: There is a  small left pleural effusion. There is mild left base atelectasis. Lungs elsewhere clear. Heart is enlarged with pulmonary vascularity within normal limits. Patient is status post median sternotomy. There is a stent in the right innominate vein region. No adenopathy. No bone lesions. IMPRESSION: Stable cardiomegaly. Small left effusion with left base atelectasis. Lungs elsewhere clear. Electronically Signed   By: Lowella Grip III M.D.   On: 01/22/2016 07:04   Nm Pulmonary Perf And Vent  01/22/2016  CLINICAL DATA:  Acute onset shortness of breath and chest pain. End-stage renal disease on dialysis. EXAM: NUCLEAR MEDICINE VENTILATION - PERFUSION LUNG SCAN TECHNIQUE: Ventilation images were obtained in multiple projections using inhaled aerosol Tc-67m DTPA. Perfusion images were obtained in multiple projections after intravenous injection of Tc-44m MAA. RADIOPHARMACEUTICALS:  31.0 mCi Technetium-27m DTPA aerosol inhalation and 4.2 mCi  Technetium-72m MAA IV COMPARISON:  Chest radiograph on 01/22/2016 FINDINGS: Ventilation: Diffusely heterogeneous distribution of radiopharmaceutical throughout both lungs, which is nonspecific. Perfusion: No wedge shaped peripheral perfusion defects to suggest acute pulmonary embolism. IMPRESSION: No evidence of pulmonary embolism. Heterogeneous ventilatory activity noted throughout both lungs, nonspecific. Electronically Signed   By: Earle Gell M.D.   On: 01/22/2016 16:20   Dg Chest Port 1 View  01/23/2016  CLINICAL DATA:  Respiratory failure. EXAM: PORTABLE CHEST 1 VIEW COMPARISON:  01/22/2016.  CT 11/23/2015. FINDINGS: Patient rotated to the left. Prior CABG. Stable prominent cardiomegaly. Right innominate vein stent again noted. No pulmonary venous congestion. Right lower lobe infiltrate with right pleural effusion. No pneumothorax. IMPRESSION: 1.  Prior CABG.  Stable prominent cardiomegaly. 2. Developing right lower lobe infiltrate and right pleural effusion. Electronically Signed   By: Marcello Moores  Register   On: 01/23/2016 07:30    Cardiac Studies: EKG 01/23/2016: Sinus rhythm/sinus tachycardia at the rate of 95 bpm, LVH with repolarization abnormality, cannot exclude high lateral ischemia. Nonspecific T abnormality.   Echocardiogram 01/23/2016: pending  Echocardiogram. 11/29/2013 Left ventricle: The cavity size was normal. Wall thickness was increased in a pattern of mild LVH. Systolic function was normal. The estimated ejection fraction was in the range of 50% to 55%. Wall motion was normal; there were no regional wall motion abnormalities. Doppler parameters are consistent with abnormal left ventricular relaxation (grade 1 diastolic dysfunction). Mitral valve: Calcified annulus. Mildly thickened leafletsLeft atrium: The atrium was mildly dilated.  Lexiscan myoview stress test 01/08/2015: 1. The resting electrocardiogram demonstrated normal sinus rhythm and no resting arrhythmias. Non specific T  inversion high lateral leads. Stress EKG is non-diagnostic for ischemia as it a pharmacologic stress using Lexiscan. Stress symptoms included dyspnea. 2. The perfusion imaging study demonstrates a very small-sized moderate ischemia in the basal inferior, inferoapical and apical septal region. Left ventricle systolic function was preserved with an ejection fraction of 55%. This is an abnormal stress test, however low risk due to size of the defect.   Assessment/Plan:  1. NSTEMI secondary to demand ischemia from severe acute anemia, hemoglobin 5.5/hematocrit 18.1, down from 8.3/27.7 on 11/23/2015. 2. 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 3. History of NSVT in 2015 admission, no recurrence on telemetry this admission. 4. H/O Renal transplant and rejection, presently back on HD. Underwent transplant nephrectomy and abdominal wall hernia last month at [facility] 5. IDDM (type I) controlled without hyperglycemia. 6. PAD with symptoms of claudication in bilateral lower extremities, no acute  limb threatening ischemia. 7. Hypertension. 8. Severe anemia of chronic disease, specifically renal failure 9. Acute on chronic diastolic heart failure  Recommendation: Hgb improved to 9.5 from 5.5 following transfusion of PRBCs. Vitals stable. Repeat chest xray this morning reveals developing right lower lobe infiltrate and right pleural effusion. May likely need dialysis again, D/W Dr. Barry Dienes regarding this. . SOB somewhat improved but cough is worse, has orthopnea. Troponin trending down. Continue medical therapy.   Adrian Prows, MD 01/23/2016, 9:27 AM [facility] [place] Pager: [phone] Office: [phone] If no answer: Cell:  [phone]

## 2016-01-23 NOTE — Progress Notes (Signed)
[facility] Progress Note   Subjective: SOB better, no c/o's, coughing some  Filed Vitals:   01/23/16 0645 01/23/16 0734 01/23/16 0800 01/23/16 0812  BP:  143/97 138/98   Pulse:  90 93 94  Temp:  98.2 F (36.8 C)    TempSrc:  Oral    Resp:  27 25 27   Height:      Weight: 88.406 kg (194 lb 14.4 oz)     SpO2:  100% 100% 100%    Inpatient medications: . amLODipine  2.5 mg Oral Daily  . antiseptic oral rinse  7 mL Mouth Rinse BID  . aspirin EC  81 mg Oral Daily  . atorvastatin  10 mg Oral q1800  . calcium carbonate  1 tablet Oral Q breakfast  . carvedilol  25 mg Oral BID WC  . colchicine  0.6 mg Oral Q14 Days  . darbepoetin (ARANESP) injection - DIALYSIS  200 mcg Intravenous Q Tue-HD  . insulin aspart  0-5 Units Subcutaneous QHS  . insulin aspart  0-9 Units Subcutaneous TID WC  . isosorbide mononitrate  30 mg Oral Daily  . sodium chloride flush  3 mL Intravenous Q12H     acetaminophen, ipratropium-albuterol, ondansetron **OR** ondansetron (ZOFRAN) IV  Exam: Alert, talking on the phone, no distress +JVD no bruits Chest clear bilat to bases RRR no MRG heard Abd soft ntnd obese, RLQ tx nontender, +bs no ascites GU normal male MS no joint effusions or deformity Ext no LE or UE edema / old accesses L arm and R arm RUA AVG+bruit, no LE wounds or ulcers Neuro is alert, Ox 3 , nf   Dialysis: [facility] 4h 90.5kg 2/2.5 bath P4 Hep 1600 RUA AVG Venof 50/wk Mircera 225 next due 6/27  Assessment: 1 Dyspnea/ chest pain - better after prbc's and HD.  A little SOB today. V/Q negative. CXR today w new R effusion/infiltrate (yest had "small" effusion on L). Having some cough and fevers, too, possible PNA? Will d/w primary  2 Volume- under dry by 2kg 3 Anemia - recurrent, unclear cause, no gross GI bleed. Got darbe 200 ug here yesterday 6/27, then weekly. Will check Fe stores today  4 Hx failed renal Tx 5 ESRD HD TTS 6 DM not on any diabetic meds 7  HTN on coreg/ norvasc at home  Plan - will try extra HD today to see if there is extra vol left.  Decrease coreg   Kelly Splinter MD [facility] pager [phone]    cell [phone] 01/23/2016, 9:50 AM    Recent Labs Lab 01/22/16 0735 01/22/16 0828 01/23/16 0320  NA 136  --  137  K 5.3*  --  4.1  CL 98*  --  99*  CO2 22  --  30  GLUCOSE 84  --  111*  BUN 47*  --  21*  CREATININE 12.73*  --  7.27*  CALCIUM 9.3  --  8.0*  PHOS  --  3.8  --     Recent Labs Lab 01/22/16 0735 01/23/16 0320  AST 24 27  ALT 10* 8*  ALKPHOS 62 69  BILITOT 1.0 1.1  PROT 7.2 6.8  ALBUMIN 3.2* 2.8*    Recent Labs Lab 01/22/16 0735 01/22/16 1720 01/23/16 0320  WBC 8.6 5.7 5.3  NEUTROABS 5.5  --   --   HGB 5.5* 9.0* 9.5*  HCT 18.1* 30.6* 31.7*  MCV 85.0 86.4 86.6  PLT 213 190 179   Iron/TIBC/Ferritin/ %Sat  Component Value Date/Time   IRON 157* 09/15/2014 1621   TIBC NOT CALC 09/15/2014 1621   FERRITIN 1048* 09/15/2014 1621   IRONPCTSAT NOT CALC 09/15/2014 1621

## 2016-01-23 NOTE — Progress Notes (Signed)
[facility] TEAM 1 - Stepdown/ICU Patrick [name]  [id number] DOB: 1955-09-13 DOA: 01/22/2016 PCP: Elyn Peers, MD    Brief Narrative:  60 y.o. male with history of HTN, DM, ESRD on dialysis, nephrectomy after failed transplant, Gout, and Hyperparathyroidism who presented w/ acute onset SOB w/ mild L sided chest tightness.Upon arrival he was found to have a Hgb of 5.5 w/ no clear cause.     Subjective: The patient states he feels better but is still somewhat short of breath compared to his usual.  He reports hectic cough last night but inability to produce sputum.  He denies chest pain or tightness.  He denies melena or hematochezia.  He does report having a very poor appetite dating back to his renal explantation months ago.  Assessment & Plan:  Acute hypoxic respiratory failure Appears to be due to pulmonary edema + PNA - see issues discusses separately below  RLL Pneumonia - R Pleural effusion  Cont empiric abx coverage - stop Vanc as MRSA screen negative - may require thora if R effusion worsens   Acute Anemia hemoglobin 5.5 at presentation - baseline 8.3 on 11/23/2015 - no clear source at present - check iron studies - guaiac stool - follow hemoglobin trend - erythropoietin per nephrology  NSTEMI demand ischemia from severe acute anemia - Cardiology following - further acute eval at this time not likely be required  ESRD on HD T/Th/Sat Nephrology following  DM2 CBG well controlled at this time  HTN Blood pressure well controlled at this time  Gout Quiescent  DVT prophylaxis: SCD Code Status: FULL CODE Family Communication: no family present at time of exam  Disposition Plan: SDU   Consultants:  Cards Nephrology   Procedures: none  Antimicrobials:  Zosyn 6/27 > Vanc 6/27  Objective: Blood pressure 138/98, pulse 94, temperature 98.2 F (36.8 C), temperature source Oral, resp. rate 27, height 5' 6.5" (1.689 m), weight 88.406 kg (194 lb 14.4 oz), SpO2  100 %.  Intake/Output Summary (Last 24 hours) at 01/23/16 1025 Last data filed at 01/22/16 2209  Gross per 24 hour  Intake    670 ml  Output   3193 ml  Net  -2523 ml   Filed Weights   01/22/16 1547 01/22/16 1715 01/23/16 0645  Weight: 89 kg (196 lb 3.4 oz) 82.3 kg (181 lb 7 oz) 88.406 kg (194 lb 14.4 oz)    Examination: General: No acute respiratory distress at rest in bed  Lungs: Poor air movement in bilateral bases right greater than left with no wheezing Cardiovascular: Regular rate and rhythm without murmur gallop or rub normal S1 and S2 Abdomen: Nontender, protuberant/overweight, soft, bowel sounds positive, no rebound, no ascites, no appreciable mass Extremities: No significant cyanosis, or clubbing - trace edema bilateral lower extremities  CBC:  Recent Labs Lab 01/22/16 0735 01/22/16 1720 01/23/16 0320  WBC 8.6 5.7 5.3  NEUTROABS 5.5  --   --   HGB 5.5* 9.0* 9.5*  HCT 18.1* 30.6* 31.7*  MCV 85.0 86.4 86.6  PLT 213 190 0000000   Basic Metabolic Panel:  Recent Labs Lab 01/22/16 0735 01/22/16 0828 01/23/16 0320  NA 136  --  137  K 5.3*  --  4.1  CL 98*  --  99*  CO2 22  --  30  GLUCOSE 84  --  111*  BUN 47*  --  21*  CREATININE 12.73*  --  7.27*  CALCIUM 9.3  --  8.0*  MG  --  1.8  --   PHOS  --  3.8  --    GFR: Estimated Creatinine Clearance: 11.2 mL/min (by C-G formula based on Cr of 7.27).  Liver Function Tests:  Recent Labs Lab 01/22/16 0735 01/23/16 0320  AST 24 27  ALT 10* 8*  ALKPHOS 62 69  BILITOT 1.0 1.1  PROT 7.2 6.8  ALBUMIN 3.2* 2.8*   Coagulation Profile: No results for input(s): INR, PROTIME in the last 168 hours.  Cardiac Enzymes:  Recent Labs Lab 01/22/16 0828 01/22/16 1720 01/22/16 2236  TROPONINI 3.50* 5.01* 4.49*    HbA1C: HEMOGLOBIN A1C  Date/Time Value Ref Range Status  06/06/2015 02:46 PM 5.8  Final  03/06/2015 03:47 PM 6.3  Final   HGB A1C MFR BLD  Date/Time Value Ref Range Status  10/11/2015 03:17 PM  5.4 4.8 - 5.6 % Final    Comment:    (NOTE)         Pre-diabetes: 5.7 - 6.4         Diabetes: >6.4         Glycemic control for adults with diabetes: <7.0   11/27/2014 01:54 PM 4.7 4.6 - 6.5 % Final    Comment:    Glycemic Control Guidelines for People with Diabetes:Non Diabetic:  <6%Goal of Therapy: <7%Additional Action Suggested:  >8%     CBG:  Recent Labs Lab 01/22/16 1807 01/22/16 2139 01/23/16 0733  GLUCAP 80 116* 115*    Recent Results (from the past 240 hour(s))  MRSA PCR Screening     Status: None   Collection Time: 01/22/16  5:03 PM  Result Value Ref Range Status   MRSA by PCR NEGATIVE NEGATIVE Final    Comment:        The GeneXpert MRSA Assay (FDA approved for NASAL specimens only), is one component of a comprehensive MRSA colonization surveillance program. It is not intended to diagnose MRSA infection nor to guide or monitor treatment for MRSA infections.      Scheduled Meds: . amLODipine  2.5 mg Oral Daily  . antiseptic oral rinse  7 mL Mouth Rinse BID  . aspirin EC  81 mg Oral Daily  . atorvastatin  10 mg Oral q1800  . calcium carbonate  1 tablet Oral Q breakfast  . carvedilol  25 mg Oral BID WC  . colchicine  0.6 mg Oral Q14 Days  . darbepoetin (ARANESP) injection - DIALYSIS  200 mcg Intravenous Q Tue-HD  . insulin aspart  0-5 Units Subcutaneous QHS  . insulin aspart  0-9 Units Subcutaneous TID WC  . isosorbide mononitrate  30 mg Oral Daily  . sodium chloride flush  3 mL Intravenous Q12H     LOS: 1 day   Time spent: 35 minutes   Cherene Altes, MD [facility] Office  [phone] Pager - Text Page per Amion as per below:  On-Call/Text Page:      [email or web address]      password [email or web address]  If 7PM-7AM, please contact night-coverage [email or web address] Password [facility] 01/23/2016, 10:25 AM

## 2016-01-23 NOTE — Progress Notes (Signed)
Pharmacy note: zosyn  77 you male with PNA to begin zosyn per pharmacy. He is noted with ESRD -WBC= 5.3, afebrile -blood and urine cultures ordered -MRSA PCR: negative  Plan -Zosyn 2.25gm IV q8h. No adjustments anticipated Will sign off. Please contact pharmacy with any other needs.  Thank you

## 2016-01-23 NOTE — Care Management Important Message (Signed)
Important Message  Patient Details  Name: Patrick [name] MRN: [id number] Date of Birth: Sep 05, 1955   Medicare Important Message Given:  Yes    Loann Quill 01/23/2016, 9:55 AM

## 2016-01-24 ENCOUNTER — Other Ambulatory Visit (HOSPITAL_COMMUNITY): Payer: Medicare Other

## 2016-01-24 DIAGNOSIS — J90 Pleural effusion, not elsewhere classified: Secondary | ICD-10-CM | POA: Diagnosis present

## 2016-01-24 DIAGNOSIS — J189 Pneumonia, unspecified organism: Secondary | ICD-10-CM

## 2016-01-24 DIAGNOSIS — D649 Anemia, unspecified: Secondary | ICD-10-CM

## 2016-01-24 DIAGNOSIS — J9601 Acute respiratory failure with hypoxia: Secondary | ICD-10-CM

## 2016-01-24 DIAGNOSIS — E118 Type 2 diabetes mellitus with unspecified complications: Secondary | ICD-10-CM | POA: Diagnosis present

## 2016-01-24 LAB — BASIC METABOLIC PANEL
ANION GAP: 10 (ref 5–15)
BUN: 13 mg/dL (ref 6–20)
CALCIUM: 7.9 mg/dL — AB (ref 8.9–10.3)
CO2: 27 mmol/L (ref 22–32)
Chloride: 98 mmol/L — ABNORMAL LOW (ref 101–111)
Creatinine, Ser: 6.47 mg/dL — ABNORMAL HIGH (ref 0.61–1.24)
GFR, EST AFRICAN AMERICAN: 10 mL/min — AB (ref >60)
GFR, EST NON AFRICAN AMERICAN: 8 mL/min — AB (ref >60)
GLUCOSE: 92 mg/dL (ref 65–99)
Potassium: 3.9 mmol/L (ref 3.5–5.1)
Sodium: 135 mmol/L (ref 135–145)

## 2016-01-24 LAB — IRON AND TIBC
IRON: 29 ug/dL — AB (ref 45–182)
Saturation Ratios: 18 % (ref 17.9–39.5)
TIBC: 161 ug/dL — ABNORMAL LOW (ref 250–450)
UIBC: 132 ug/dL

## 2016-01-24 LAB — GLUCOSE, CAPILLARY
GLUCOSE-CAPILLARY: 90 mg/dL (ref 65–99)
GLUCOSE-CAPILLARY: 108 mg/dL — AB (ref 65–99)
GLUCOSE-CAPILLARY: 102 mg/dL — AB (ref 65–99)

## 2016-01-24 LAB — CBC
HCT: 34.1 % — ABNORMAL LOW (ref 39.0–52.0)
HEMOGLOBIN: 10.3 g/dL — AB (ref 13.0–17.0)
MCH: 27 pg (ref 26.0–34.0)
MCHC: 30.2 g/dL (ref 30.0–36.0)
MCV: 89.3 fL (ref 78.0–100.0)
Platelets: 179 10*3/uL (ref 150–400)
RBC: 3.82 MIL/uL — AB (ref 4.22–5.81)
RDW: 17.6 % — ABNORMAL HIGH (ref 11.5–15.5)
WBC: 6.2 10*3/uL (ref 4.0–10.5)

## 2016-01-24 MED ORDER — MIDODRINE HCL 5 MG PO TABS
ORAL_TABLET | ORAL | Status: AC
  Filled 2016-01-24: qty 1

## 2016-01-24 MED ORDER — VANCOMYCIN HCL IN DEXTROSE 1-5 GM/200ML-% IV SOLN
1000.0000 mg | INTRAVENOUS | Status: DC
  Administered 2016-01-26: 1000 mg via INTRAVENOUS
  Filled 2016-01-24: qty 200

## 2016-01-24 MED ORDER — MIDODRINE HCL 5 MG PO TABS
5.0000 mg | ORAL_TABLET | Freq: Three times a day (TID) | ORAL | Status: DC
  Filled 2016-01-24: qty 1

## 2016-01-24 MED ORDER — VANCOMYCIN HCL 10 G IV SOLR
2000.0000 mg | Freq: Once | INTRAVENOUS | Status: AC
  Administered 2016-01-24: 2000 mg via INTRAVENOUS
  Filled 2016-01-24: qty 2000

## 2016-01-24 MED ORDER — CARVEDILOL 6.25 MG PO TABS: 6.2500 mg | ORAL_TABLET | Freq: Two times a day (BID) | ORAL | Status: DC

## 2016-01-24 NOTE — Progress Notes (Signed)
Pt refusing labs this morning, he states they'll be drawn during dialysis today.

## 2016-01-24 NOTE — Progress Notes (Signed)
[facility] Progress Note   Subjective: not tolerating vol removal at HD today, BP in 70's- 80's. Fevers better, cough and SOB better overall.   Filed Vitals:   01/24/16 0959 01/24/16 1012 01/24/16 1017 01/24/16 1025  BP: 91/63 76/62 84/56  73/59  Pulse: 73 70 75 75  Temp:      TempSrc:      Resp: 20 26 26 22   Height:      Weight:      SpO2: 100% 97% 99% 94%    Inpatient medications: . antiseptic oral rinse  7 mL Mouth Rinse BID  . aspirin EC  81 mg Oral Daily  . atorvastatin  10 mg Oral q1800  . calcium carbonate  1 tablet Oral Q breakfast  . carvedilol  6.25 mg Oral BID WC  . colchicine  0.6 mg Oral Q14 Days  . darbepoetin (ARANESP) injection - DIALYSIS  200 mcg Intravenous Q Tue-HD  . insulin aspart  0-5 Units Subcutaneous QHS  . insulin aspart  0-9 Units Subcutaneous TID WC  . isosorbide mononitrate  30 mg Oral Daily  . midodrine      . piperacillin-tazobactam (ZOSYN)  IV  2.25 g Intravenous Q8H  . sodium chloride flush  3 mL Intravenous Q12H     sodium chloride, sodium chloride, acetaminophen, alteplase, heparin, ipratropium-albuterol, lidocaine (PF), lidocaine-prilocaine, ondansetron **OR** ondansetron (ZOFRAN) IV, pentafluoroprop-tetrafluoroeth  Exam: Awake, on HD, feels bad, BP's low Chest clear bilat to bases RRR no MRG heard Abd soft ntnd obese, RLQ tx nontender, +bs no ascites GU normal male MS no joint effusions or deformity Ext no LE or UE edema / old accesses L arm and R arm RUA AVG+bruit, no LE wounds or ulcers Neuro is alert, Ox 3 , nf   Dialysis: [facility] 4h 90.5kg 2/2.5 bath P4 Hep 1600 RUA AVG Venof 50/wk Mircera 225 next due 6/27  Assessment: 1 Dyspnea/ chest pain/ RLL PNA w effusion - on zosyn now, better; unable to pull vol w hd now 2 Volume - no vol to get today, is under dry wt 2kg 3 Anemia - recurrent, unclear cause, no gross GI bleed; max darbe, check fe/tibc today 4 Hx failed renal Tx 5 ESRD HD TTS 6 DM  not on any diabetic meds 7 HTN on coreg/ norvasc at home  Plan - abort HD today, no more vol to remove; cont abx, mobilize, let BP come up   Kelly Splinter MD [facility] pager [phone]    cell [phone] 01/24/2016, 10:44 AM    Recent Labs Lab 01/22/16 0735 01/22/16 0828 01/23/16 0320 01/24/16 0815  NA 136  --  137 135  K 5.3*  --  4.1 3.9  CL 98*  --  99* 98*  CO2 22  --  30 27  GLUCOSE 84  --  111* 92  BUN 47*  --  21* 13  CREATININE 12.73*  --  7.27* 6.47*  CALCIUM 9.3  --  8.0* 7.9*  PHOS  --  3.8  --   --     Recent Labs Lab 01/22/16 0735 01/23/16 0320  AST 24 27  ALT 10* 8*  ALKPHOS 62 69  BILITOT 1.0 1.1  PROT 7.2 6.8  ALBUMIN 3.2* 2.8*    Recent Labs Lab 01/22/16 0735 01/22/16 1720 01/23/16 0320 01/24/16 0815  WBC 8.6 5.7 5.3 6.2  NEUTROABS 5.5  --   --   --   HGB 5.5* 9.0* 9.5* 10.3*  HCT 18.1* 30.6*  31.7* 34.1*  MCV 85.0 86.4 86.6 89.3  PLT 213 190 179 179   Iron/TIBC/Ferritin/ %Sat    Component Value Date/Time   IRON 157* 09/15/2014 1621   TIBC NOT CALC 09/15/2014 1621   FERRITIN 1048* 09/15/2014 1621   IRONPCTSAT NOT CALC 09/15/2014 1621

## 2016-01-24 NOTE — Progress Notes (Signed)
Assessment completed pt c/o slight SOB with , denied pain pt sent out of unit for dialysis. via bed and oxygen 2l.

## 2016-01-24 NOTE — Progress Notes (Signed)
ANTIBIOTIC CONSULT NOTE - INITIAL  Pharmacy Consult for vancomycin Indication: pneumonia  Allergies  Allergen Reactions  . Iodinated Diagnostic Agents Rash    Other Reaction: burning to mouth, swelling of lips.    Patient Measurements: Height: 5' 6.5" (168.9 cm) Weight: 192 lb 10.9 oz (87.4 kg) (Standing scale) IBW/kg (Calculated) : 64.95 Adjusted Body Weight:   Vital Signs: Temp: 97.6 F (36.4 C) (06/29 1633) Temp Source: Oral (06/29 1633) BP: 116/78 mmHg (06/29 1633) Pulse Rate: 81 (06/29 1700) Intake/Output from previous day: 06/28 0701 - 06/29 0700 In: 1450 [P.O.:1320; I.V.:30; IV Piggyback:100] Out: 2500  Intake/Output from this shift:    Labs:  Recent Labs  01/22/16 0735 01/22/16 1720 01/23/16 0320 01/24/16 0815  WBC 8.6 5.7 5.3 6.2  HGB 5.5* 9.0* 9.5* 10.3*  PLT 213 190 179 179  CREATININE 12.73*  --  7.27* 6.47*   Estimated Creatinine Clearance: 12.5 mL/min (by C-G formula based on Cr of 6.47). No results for input(s): VANCOTROUGH, VANCOPEAK, VANCORANDOM, GENTTROUGH, GENTPEAK, GENTRANDOM, TOBRATROUGH, TOBRAPEAK, TOBRARND, AMIKACINPEAK, AMIKACINTROU, AMIKACIN in the last 72 hours.   Microbiology: Recent Results (from the past 720 hour(s))  Blood Culture (routine x 2)     Status: None (Preliminary result)   Collection Time: 01/22/16  7:30 AM  Result Value Ref Range Status   Specimen Description BLOOD LEFT ANTECUBITAL  Final   Special Requests BOTTLES DRAWN AEROBIC AND ANAEROBIC  5CC  Final   Culture NO GROWTH 2 DAYS  Final   Report Status PENDING  Incomplete  Blood Culture (routine x 2)     Status: None (Preliminary result)   Collection Time: 01/22/16  7:35 AM  Result Value Ref Range Status   Specimen Description BLOOD LEFT HAND  Final   Special Requests BOTTLES DRAWN AEROBIC AND ANAEROBIC  5CC  Final   Culture NO GROWTH 2 DAYS  Final   Report Status PENDING  Incomplete  MRSA PCR Screening     Status: None   Collection Time: 01/22/16  5:03 PM   Result Value Ref Range Status   MRSA by PCR NEGATIVE NEGATIVE Final    Comment:        The GeneXpert MRSA Assay (FDA approved for NASAL specimens only), is one component of a comprehensive MRSA colonization surveillance program. It is not intended to diagnose MRSA infection nor to guide or monitor treatment for MRSA infections.     Medical History: Past Medical History  Diagnosis Date  . Hypertension   . Diabetes mellitus without complication ([place])   . End-stage renal failure with renal transplant ([place])   . Gout   . Impotence of organic origin   . Type I (juvenile type) diabetes mellitus with renal manifestations, not stated as uncontrolled   . Secondary hyperparathyroidism ([place])     Medications:  Scheduled:  . antiseptic oral rinse  7 mL Mouth Rinse BID  . aspirin EC  81 mg Oral Daily  . atorvastatin  10 mg Oral q1800  . calcium carbonate  1 tablet Oral Q breakfast  . colchicine  0.6 mg Oral Q14 Days  . darbepoetin (ARANESP) injection - DIALYSIS  200 mcg Intravenous Q Tue-HD  . insulin aspart  0-5 Units Subcutaneous QHS  . insulin aspart  0-9 Units Subcutaneous TID WC  . isosorbide mononitrate  30 mg Oral Daily  . midodrine  5 mg Oral TID WC  . piperacillin-tazobactam (ZOSYN)  IV  2.25 g Intravenous Q8H  . sodium chloride flush  3 mL Intravenous Q12H  Infusions:   Assessment: 60 yo male with HCAP will be started on vancomycin.  Patient has ESRD on HD TThSa (already had HD today).  Didn't tolerate volume removal today  Goal of Therapy:  Pre-HD level 15-25  Plan:  - vancomycin 2g iv x1, then 1g iv qHD - monitor HD tolerance  Jeren Dufrane, Tsz-Yin 01/24/2016,7:05 PM

## 2016-01-24 NOTE — Progress Notes (Signed)
PROGRESS NOTE    Patrick [name]  [id number] DOB: 1955/10/20 DOA: 01/22/2016 PCP: Elyn Peers, MD   Brief Narrative:  60 y.o. 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    Assessment & Plan:   Active Problems:   End-stage renal failure with renal transplant (HCC)   Chest pain   S/P CABG x 1   Symptomatic anemia   Acute respiratory failure (HCC)   Acute diastolic (congestive) heart failure (HCC)   Elevated troponin   Febrile illness    Acute hypoxic respiratory failure -Appears to be due to pulmonary edema + PNA  -Continue antibiotics and complete 5 day course.  RLL Pneumonia(HCAP)/ Rt Pleural effusion  -Cont HCAP meds - may require thora if R effusion worsens  -Obtain PCXR on 6/30  Acute Anemia( baseline 8.3 on 11/23/2015) -hemoglobin 5.5 at presentation  - no clear source at present  - check iron studies  - guaiac stool  Recent Labs Lab 01/22/16 0735 01/22/16 1720 01/23/16 0320 01/24/16 0815  HGB 5.5* 9.0* 9.5* 10.3*  - follow hemoglobin trend - erythropoietin per nephrology  NSTEMI -demand ischemia from severe acute anemia - Cardiology following - further acute eval at this time not likely be required -Strict in and out since admission -3.5 L -Daily weight Filed Weights   01/23/16 1646 01/24/16 0333 01/24/16 0755  Weight: 86.1 kg (189 lb 13.1 oz) 87.3 kg (192 lb 7.4 oz) 87.4 kg (192 lb 10.9 oz)  -Echocardiogram pending  ESRD on HD T/Th/Sat -Per Nephrology   DM Type 2 controlled with complication -[id number] hemoglobin A1c = 4.4   HTN -Currently patient hypotensive with nausea, HA  -6/29 Start midodrine 5 mg  TID. May have to give patient small normal saline bolus.  Gout Quiescent    DVT prophylaxis: SCD Code Status: Full Family Communication: None Disposition Plan: Per  nephrology   Consultants:  Dr.Jay [facility] [place] [facility]    Procedures/Significant Events:    Cultures 6/27 blood left AC/hand NGTD 6/27 MRSA by PCR negative  Antimicrobials: Zosyn 6/27 > Vanc 6/27   Devices    LINES / TUBES:      Continuous Infusions:    Subjective: 6/29 A/O 4, positive N/V, positive severe headache. States usually does not have migraines no problems with HD. States BP normally not an issue.     Objective: Filed Vitals:   01/24/16 0808 01/24/16 0825 01/24/16 0858 01/24/16 0925  BP: 96/67 113/84 101/68 98/71  Pulse: 84 80 75 72  Temp:      TempSrc:      Resp: 25 25 25 24   Height:      Weight:      SpO2: 100% 98% 97% 99%    Intake/Output Summary (Last 24 hours) at 01/24/16 0952 Last data filed at 01/24/16 0600  Gross per 24 hour  Intake   1090 ml  Output   2500 ml  Net  -1410 ml   Filed Weights   01/23/16 1646 01/24/16 0333 01/24/16 0755  Weight: 86.1 kg (189 lb 13.1 oz) 87.3 kg (192 lb 7.4 oz) 87.4 kg (192 lb 10.9 oz)    Examination:  General: A/O 4, positive distress secondary to in/V and headache, No acute respiratory distress Eyes: negative scleral hemorrhage, negative anisocoria, negative icterus ENT: Negative Runny nose, negative gingival bleeding, Neck:  Negative  scars, masses, torticollis, lymphadenopathy, JVD Lungs: Clear to auscultation bilaterally without wheezes or crackles Cardiovascular: Regular rate and rhythm without murmur gallop or rub normal S1 and S2 Abdomen: negative abdominal pain, nondistended, positive soft, bowel sounds, no rebound, no ascites, no appreciable mass, well-healed abdominal surgical incision. Extremities: No significant cyanosis, clubbing, or edema bilateral lower extremities Skin: Negative rashes, lesions, ulcers Psychiatric:  Negative depression, negative anxiety, negative fatigue, negative mania  Central nervous system:  Cranial nerves II through XII intact,  tongue/uvula midline, all extremities muscle strength 5/5, sensation intact throughout, negative dysarthria, negative expressive aphasia, negative receptive aphasia.  .     Data Reviewed: Care during the described time interval was provided by me .  I have reviewed this patient's available data, including medical history, events of note, physical examination, and all test results as part of my evaluation. I have personally reviewed and interpreted all radiology studies.  CBC:  Recent Labs Lab 01/22/16 0735 01/22/16 1720 01/23/16 0320 01/24/16 0815  WBC 8.6 5.7 5.3 6.2  NEUTROABS 5.5  --   --   --   HGB 5.5* 9.0* 9.5* 10.3*  HCT 18.1* 30.6* 31.7* 34.1*  MCV 85.0 86.4 86.6 89.3  PLT 213 190 179 0000000   Basic Metabolic Panel:  Recent Labs Lab 01/22/16 0735 01/22/16 0828 01/23/16 0320 01/24/16 0815  NA 136  --  137 135  K 5.3*  --  4.1 3.9  CL 98*  --  99* 98*  CO2 22  --  30 27  GLUCOSE 84  --  111* 92  BUN 47*  --  21* 13  CREATININE 12.73*  --  7.27* 6.47*  CALCIUM 9.3  --  8.0* 7.9*  MG  --  1.8  --   --   PHOS  --  3.8  --   --    GFR: Estimated Creatinine Clearance: 12.5 mL/min (by C-G formula based on Cr of 6.47). Liver Function Tests:  Recent Labs Lab 01/22/16 0735 01/23/16 0320  AST 24 27  ALT 10* 8*  ALKPHOS 62 69  BILITOT 1.0 1.1  PROT 7.2 6.8  ALBUMIN 3.2* 2.8*   No results for input(s): LIPASE, AMYLASE in the last 168 hours. No results for input(s): AMMONIA in the last 168 hours. Coagulation Profile: No results for input(s): INR, PROTIME in the last 168 hours. Cardiac Enzymes:  Recent Labs Lab 01/22/16 0828 01/22/16 1720 01/22/16 2236  TROPONINI 3.50* 5.01* 4.49*   BNP (last 3 results) No results for input(s): PROBNP in the last 8760 hours. HbA1C:  Recent Labs  01/22/16 0828  HGBA1C 4.4*   CBG:  Recent Labs Lab 01/23/16 0733 01/23/16 1152 01/23/16 1844 01/23/16 2020 01/24/16 0731  GLUCAP 115* 96 98 132* 90   Lipid  Profile: No results for input(s): CHOL, HDL, LDLCALC, TRIG, CHOLHDL, LDLDIRECT in the last 72 hours. Thyroid Function Tests: No results for input(s): TSH, T4TOTAL, FREET4, T3FREE, THYROIDAB in the last 72 hours. Anemia Panel: No results for input(s): VITAMINB12, FOLATE, FERRITIN, TIBC, IRON, RETICCTPCT in the last 72 hours. Urine analysis:    Component Value Date/Time   COLORURINE RED* 11/17/2015 0910   APPEARANCEUR TURBID* 11/17/2015 0910   LABSPEC 1.035* 11/17/2015 0910   PHURINE 6.0 11/17/2015 0910   GLUCOSEU 100* 11/17/2015 0910   HGBUR LARGE* 11/17/2015 0910   BILIRUBINUR LARGE* 11/17/2015 0910   KETONESUR 40* 11/17/2015 0910   PROTEINUR >300* 11/17/2015 0910   UROBILINOGEN 0.2 11/24/2013 0242   NITRITE POSITIVE* 11/17/2015 0910  LEUKOCYTESUR LARGE* 11/17/2015 0910   Sepsis Labs: @LABRCNTIP (procalcitonin:4,lacticidven:4)  ) Recent Results (from the past 240 hour(s))  Blood Culture (routine x 2)     Status: None (Preliminary result)   Collection Time: 01/22/16  7:30 AM  Result Value Ref Range Status   Specimen Description BLOOD LEFT ANTECUBITAL  Final   Special Requests BOTTLES DRAWN AEROBIC AND ANAEROBIC  5CC  Final   Culture NO GROWTH 1 DAY  Final   Report Status PENDING  Incomplete  Blood Culture (routine x 2)     Status: None (Preliminary result)   Collection Time: 01/22/16  7:35 AM  Result Value Ref Range Status   Specimen Description BLOOD LEFT HAND  Final   Special Requests BOTTLES DRAWN AEROBIC AND ANAEROBIC  5CC  Final   Culture NO GROWTH 1 DAY  Final   Report Status PENDING  Incomplete  MRSA PCR Screening     Status: None   Collection Time: 01/22/16  5:03 PM  Result Value Ref Range Status   MRSA by PCR NEGATIVE NEGATIVE Final    Comment:        The GeneXpert MRSA Assay (FDA approved for NASAL specimens only), is one component of a comprehensive MRSA colonization surveillance program. It is not intended to diagnose MRSA infection nor to guide  or monitor treatment for MRSA infections.          Radiology Studies: Nm Pulmonary Perf And Vent  01/22/2016  CLINICAL DATA:  Acute onset shortness of breath and chest pain. End-stage renal disease on dialysis. EXAM: NUCLEAR MEDICINE VENTILATION - PERFUSION LUNG SCAN TECHNIQUE: Ventilation images were obtained in multiple projections using inhaled aerosol Tc-72m DTPA. Perfusion images were obtained in multiple projections after intravenous injection of Tc-64m MAA. RADIOPHARMACEUTICALS:  31.0 mCi Technetium-32m DTPA aerosol inhalation and 4.2 mCi Technetium-27m MAA IV COMPARISON:  Chest radiograph on 01/22/2016 FINDINGS: Ventilation: Diffusely heterogeneous distribution of radiopharmaceutical throughout both lungs, which is nonspecific. Perfusion: No wedge shaped peripheral perfusion defects to suggest acute pulmonary embolism. IMPRESSION: No evidence of pulmonary embolism. Heterogeneous ventilatory activity noted throughout both lungs, nonspecific. Electronically Signed   By: Earle Gell M.D.   On: 01/22/2016 16:20   Dg Chest Port 1 View  01/23/2016  CLINICAL DATA:  Respiratory failure. EXAM: PORTABLE CHEST 1 VIEW COMPARISON:  01/22/2016.  CT 11/23/2015. FINDINGS: Patient rotated to the left. Prior CABG. Stable prominent cardiomegaly. Right innominate vein stent again noted. No pulmonary venous congestion. Right lower lobe infiltrate with right pleural effusion. No pneumothorax. IMPRESSION: 1.  Prior CABG.  Stable prominent cardiomegaly. 2. Developing right lower lobe infiltrate and right pleural effusion. Electronically Signed   By: [place]   On: 01/23/2016 07:30        Scheduled Meds: . antiseptic oral rinse  7 mL Mouth Rinse BID  . aspirin EC  81 mg Oral Daily  . atorvastatin  10 mg Oral q1800  . calcium carbonate  1 tablet Oral Q breakfast  . carvedilol  6.25 mg Oral BID WC  . colchicine  0.6 mg Oral Q14 Days  . darbepoetin (ARANESP) injection - DIALYSIS  200 mcg Intravenous  Q Tue-HD  . insulin aspart  0-5 Units Subcutaneous QHS  . insulin aspart  0-9 Units Subcutaneous TID WC  . isosorbide mononitrate  30 mg Oral Daily  . piperacillin-tazobactam (ZOSYN)  IV  2.25 g Intravenous Q8H  . sodium chloride flush  3 mL Intravenous Q12H   Continuous Infusions:    LOS: 2 days  Time spent: 40 minutes    WOODS, Geraldo Docker, MD [facility] Pager [phone]   If 7PM-7AM, please contact night-coverage [email or web address] Password [facility] 01/24/2016, 9:52 AM

## 2016-01-25 ENCOUNTER — Inpatient Hospital Stay (HOSPITAL_COMMUNITY): Payer: Medicare Other

## 2016-01-25 DIAGNOSIS — I252 Old myocardial infarction: Secondary | ICD-10-CM | POA: Diagnosis present

## 2016-01-25 DIAGNOSIS — N186 End stage renal disease: Secondary | ICD-10-CM | POA: Diagnosis not present

## 2016-01-25 DIAGNOSIS — Z940 Kidney transplant status: Secondary | ICD-10-CM

## 2016-01-25 DIAGNOSIS — T8610 Unspecified complication of kidney transplant: Secondary | ICD-10-CM | POA: Diagnosis not present

## 2016-01-25 DIAGNOSIS — I214 Non-ST elevation (NSTEMI) myocardial infarction: Principal | ICD-10-CM

## 2016-01-25 DIAGNOSIS — Z992 Dependence on renal dialysis: Secondary | ICD-10-CM | POA: Diagnosis not present

## 2016-01-25 DIAGNOSIS — Z951 Presence of aortocoronary bypass graft: Secondary | ICD-10-CM

## 2016-01-25 LAB — ECHOCARDIOGRAM COMPLETE
AV Area VTI index: 0.5 cm2/m2
AV Mean grad: 13 mmHg
AV Peak grad: 26 mmHg
AV pk vel: 254 cm/s
AV vel: 1.02
AVAREAMEANV: 0.9 cm2
AVAREAMEANVIN: 0.44 cm2/m2
AVAREAVTI: 0.86 cm2
AVCELMEANRAT: 0.45
Ao pk vel: 0.43 m/s
Ao-asc: 33 cm
CHL CUP AV PEAK INDEX: 0.42
CHL CUP AV VALUE AREA INDEX: 0.5
CHL CUP MV M VEL: 94.1
CHL CUP STROKE VOLUME: 58 mL
DOP CAL AO MEAN VELOCITY: 169 cm/s
EERAT: 15.73
EWDT: 306 ms
FS: 29 % (ref 28–44)
Height: 66.5 in
IV/PV OW: 0.95
LA ID, A-P, ES: 39 mm
LA diam index: 1.92 cm/m2
LA vol A4C: 84.7 ml
LA vol: 86.7 mL
LAVOLIN: 42.7 mL/m2
LDCA: 2.01 cm2
LEFT ATRIUM END SYS DIAM: 39 mm
LV E/e'average: 15.73
LV PW d: 9.66 mm — AB (ref 0.6–1.1)
LV TDI E'LATERAL: 6.74
LV TDI E'MEDIAL: 5.55
LV dias vol index: 50 mL/m2
LV dias vol: 102 mL (ref 62–150)
LV e' LATERAL: 6.74 cm/s
LV sys vol: 44 mL (ref 21–61)
LVEEMED: 15.73
LVOT MV VTI INDEX: 0.67 cm2/m2
LVOT MV VTI: 1.35
LVOT SV: 47 mL
LVOT VTI: 23.3 cm
LVOT peak VTI: 0.51 cm
LVOT peak vel: 109 cm/s
LVOTD: 16 mm
LVSYSVOLIN: 22 mL/m2
MV Annulus VTI: 34.6 cm
MV Dec: 306
MV pk A vel: 120 m/s
MVAP: 2.72 cm2
MVPG: 4 mmHg
MVPKEVEL: 106 m/s
Mean grad: 4 mmHg
P 1/2 time: 81 ms
RV TAPSE: 10.7 mm
Simpson's disk: 57
VTI: 46.1 cm
Valve area: 1.02 cm2
Weight: 3269.86 oz

## 2016-01-25 LAB — BASIC METABOLIC PANEL
ANION GAP: 10 (ref 5–15)
BUN: 11 mg/dL (ref 6–20)
CALCIUM: 7.9 mg/dL — AB (ref 8.9–10.3)
CO2: 27 mmol/L (ref 22–32)
Chloride: 99 mmol/L — ABNORMAL LOW (ref 101–111)
Creatinine, Ser: 5.46 mg/dL — ABNORMAL HIGH (ref 0.61–1.24)
GFR, EST AFRICAN AMERICAN: 12 mL/min — AB (ref >60)
GFR, EST NON AFRICAN AMERICAN: 10 mL/min — AB (ref >60)
Glucose, Bld: 77 mg/dL (ref 65–99)
Potassium: 4.4 mmol/L (ref 3.5–5.1)
Sodium: 136 mmol/L (ref 135–145)

## 2016-01-25 LAB — GLUCOSE, CAPILLARY
GLUCOSE-CAPILLARY: 139 mg/dL — AB (ref 65–99)
GLUCOSE-CAPILLARY: 78 mg/dL (ref 65–99)
GLUCOSE-CAPILLARY: 81 mg/dL (ref 65–99)
Glucose-Capillary: 76 mg/dL (ref 65–99)
Glucose-Capillary: 82 mg/dL (ref 65–99)

## 2016-01-25 MED ORDER — AMLODIPINE BESYLATE 2.5 MG PO TABS
2.5000 mg | ORAL_TABLET | Freq: Every day | ORAL | Status: DC
  Administered 2016-01-25 – 2016-01-26 (×2): 2.5 mg via ORAL
  Filled 2016-01-25 (×2): qty 1

## 2016-01-25 MED ORDER — CARVEDILOL 25 MG PO TABS
25.0000 mg | ORAL_TABLET | Freq: Two times a day (BID) | ORAL | Status: DC
  Administered 2016-01-25 – 2016-01-26 (×2): 25 mg via ORAL
  Filled 2016-01-25 (×2): qty 1

## 2016-01-25 NOTE — Progress Notes (Signed)
  Echocardiogram 2D Echocardiogram has been performed.  Patrick [name] 01/25/2016, 10:39 AM

## 2016-01-25 NOTE — Progress Notes (Signed)
PROGRESS NOTE    Patrick [name]  [id number] DOB: March 15, 1956 DOA: 01/22/2016 PCP: Elyn Peers, MD   Brief Narrative:  60 y.o. 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    Assessment & Plan:   Active Problems:   End-stage renal failure with renal transplant (HCC)   Chest pain   S/P CABG x 1   Symptomatic anemia   Acute respiratory failure (HCC)   Acute diastolic (congestive) heart failure (HCC)   Elevated troponin   Febrile illness   Acute respiratory failure with hypoxia (HCC)   Controlled diabetes mellitus type 2 with complications (HCC)   Pleural effusion   NSTEMI (non-ST elevated myocardial infarction) ([place])    Acute hypoxic respiratory failure -Appears to be due to pulmonary edema + PNA  -Continue antibiotics and complete 5 day course.  RLL Pneumonia(HCAP)/ Rt Pleural effusion  -Cont HCAP meds - may require thora if R effusion worsens  -PCXR; improvement in infiltrates see results below   Acute Anemia( baseline 8.3 on 11/23/2015) -hemoglobin 5.5 at presentation  - no clear source at present  - check iron studies  - guaiac stool ; negative  Recent Labs Lab 01/22/16 0735 01/22/16 1720 01/23/16 0320 01/24/16 0815  HGB 5.5* 9.0* 9.5* 10.3*  - follow hemoglobin trend - erythropoietin per nephrology  NSTEMI -demand ischemia from severe acute anemia - Cardiology following - further acute eval at this time not likely be required -Strict in and out since admission -3.5 L -Daily weight Filed Weights   01/24/16 0333 01/24/16 0755 01/25/16 0500  Weight: 87.3 kg (192 lb 7.4 oz) 87.4 kg (192 lb 10.9 oz) 92.7 kg (204 lb 5.9 oz)  -Echocardiogram pending  ESRD on HD T/Th/Sat -Per Nephrology   DM Type 2 controlled with complication -[id number] hemoglobin A1c = 4.4   HTN -Currently patient hypotensive  with nausea, HA  -midodrine 5 mg  TID.   Gout Quiescent    DVT prophylaxis: SCD Code Status: Full Family Communication: None Disposition Plan: Per nephrology   Consultants:  Dr.Jay [facility] [place] [facility]    Procedures/Significant Events:  6/30 PCXR;-Interval partial clearing RLL  infiltrate/edema.-Partial clearing Rt pleural effusion.    Cultures 6/27 blood left AC/hand NGTD 6/27 MRSA by PCR negative  Antimicrobials: Zosyn 6/27 > Vanc 6/27   Devices    LINES / TUBES:      Continuous Infusions:    Subjective: 6/30  A/O 4, negative N/V, negative severe headache.     Objective: Filed Vitals:   01/25/16 0841 01/25/16 1235 01/25/16 1700 01/25/16 1933  BP: 146/99 128/88 128/90 124/80  Pulse:      Temp: 98.3 F (36.8 C) 98.7 F (37.1 C) 98.9 F (37.2 C) 98.3 F (36.8 C)  TempSrc: Oral Oral Oral Oral  Resp: 22 16 30 20   Height:      Weight:      SpO2: 94% 94% 94% 93%    Intake/Output Summary (Last 24 hours) at 01/25/16 2011 Last data filed at 01/25/16 0530  Gross per 24 hour  Intake    630 ml  Output      0 ml  Net    630 ml   Filed Weights   01/24/16 0333 01/24/16 0755 01/25/16 0500  Weight: 87.3 kg (192 lb 7.4 oz) 87.4 kg (192 lb 10.9 oz) 92.7 kg (  204 lb 5.9 oz)    Examination:  General: A/O 4, NAD, No acute respiratory distress Eyes: negative scleral hemorrhage, negative anisocoria, negative icterus ENT: Negative Runny nose, negative gingival bleeding, Neck:  Negative scars, masses, torticollis, lymphadenopathy, JVD Lungs: Clear to auscultation bilaterally without wheezes or crackles Cardiovascular: Regular rate and rhythm without murmur gallop or rub normal S1 and S2 Abdomen: negative abdominal pain, nondistended, positive soft, bowel sounds, no rebound, no ascites, no appreciable mass, well-healed abdominal surgical incision. Extremities: No significant cyanosis, clubbing, or edema bilateral lower  extremities Skin: Negative rashes, lesions, ulcers Psychiatric:  Negative depression, negative anxiety, negative fatigue, negative mania  Central nervous system:  Cranial nerves II through XII intact, tongue/uvula midline, all extremities muscle strength 5/5, sensation intact throughout, negative dysarthria, negative expressive aphasia, negative receptive aphasia.  .     Data Reviewed: Care during the described time interval was provided by me .  I have reviewed this patient's available data, including medical history, events of note, physical examination, and all test results as part of my evaluation. I have personally reviewed and interpreted all radiology studies.  CBC:  Recent Labs Lab 01/22/16 0735 01/22/16 1720 01/23/16 0320 01/24/16 0815  WBC 8.6 5.7 5.3 6.2  NEUTROABS 5.5  --   --   --   HGB 5.5* 9.0* 9.5* 10.3*  HCT 18.1* 30.6* 31.7* 34.1*  MCV 85.0 86.4 86.6 89.3  PLT 213 190 179 0000000   Basic Metabolic Panel:  Recent Labs Lab 01/22/16 0735 01/22/16 0828 01/23/16 0320 01/24/16 0815 01/25/16 0326  NA 136  --  137 135 136  K 5.3*  --  4.1 3.9 4.4  CL 98*  --  99* 98* 99*  CO2 22  --  30 27 27   GLUCOSE 84  --  111* 92 77  BUN 47*  --  21* 13 11  CREATININE 12.73*  --  7.27* 6.47* 5.46*  CALCIUM 9.3  --  8.0* 7.9* 7.9*  MG  --  1.8  --   --   --   PHOS  --  3.8  --   --   --    GFR: Estimated Creatinine Clearance: 15.3 mL/min (by C-G formula based on Cr of 5.46). Liver Function Tests:  Recent Labs Lab 01/22/16 0735 01/23/16 0320  AST 24 27  ALT 10* 8*  ALKPHOS 62 69  BILITOT 1.0 1.1  PROT 7.2 6.8  ALBUMIN 3.2* 2.8*   No results for input(s): LIPASE, AMYLASE in the last 168 hours. No results for input(s): AMMONIA in the last 168 hours. Coagulation Profile: No results for input(s): INR, PROTIME in the last 168 hours. Cardiac Enzymes:  Recent Labs Lab 01/22/16 0828 01/22/16 1720 01/22/16 2236  TROPONINI 3.50* 5.01* 4.49*   BNP (last 3  results) No results for input(s): PROBNP in the last 8760 hours. HbA1C: No results for input(s): HGBA1C in the last 72 hours. CBG:  Recent Labs Lab 01/24/16 1630 01/24/16 2150 01/25/16 0846 01/25/16 1239 01/25/16 1710  GLUCAP 108* 102* 78 76 82   Lipid Profile: No results for input(s): CHOL, HDL, LDLCALC, TRIG, CHOLHDL, LDLDIRECT in the last 72 hours. Thyroid Function Tests: No results for input(s): TSH, T4TOTAL, FREET4, T3FREE, THYROIDAB in the last 72 hours. Anemia Panel:  Recent Labs  01/24/16 1435  TIBC 161*  IRON 29*   Urine analysis:    Component Value Date/Time   COLORURINE RED* 11/17/2015 0910   APPEARANCEUR TURBID* 11/17/2015 0910   LABSPEC 1.035* 11/17/2015 [id number]  PHURINE 6.0 11/17/2015 0910   GLUCOSEU 100* 11/17/2015 0910   HGBUR LARGE* 11/17/2015 0910   BILIRUBINUR LARGE* 11/17/2015 0910   KETONESUR 40* 11/17/2015 0910   PROTEINUR >300* 11/17/2015 0910   UROBILINOGEN 0.2 11/24/2013 0242   NITRITE POSITIVE* 11/17/2015 0910   LEUKOCYTESUR LARGE* 11/17/2015 0910   Sepsis Labs: @LABRCNTIP (procalcitonin:4,lacticidven:4)  ) Recent Results (from the past 240 hour(s))  Blood Culture (routine x 2)     Status: None (Preliminary result)   Collection Time: 01/22/16  7:30 AM  Result Value Ref Range Status   Specimen Description BLOOD LEFT ANTECUBITAL  Final   Special Requests BOTTLES DRAWN AEROBIC AND ANAEROBIC  5CC  Final   Culture NO GROWTH 3 DAYS  Final   Report Status PENDING  Incomplete  Blood Culture (routine x 2)     Status: None (Preliminary result)   Collection Time: 01/22/16  7:35 AM  Result Value Ref Range Status   Specimen Description BLOOD LEFT HAND  Final   Special Requests BOTTLES DRAWN AEROBIC AND ANAEROBIC  5CC  Final   Culture NO GROWTH 3 DAYS  Final   Report Status PENDING  Incomplete  MRSA PCR Screening     Status: None   Collection Time: 01/22/16  5:03 PM  Result Value Ref Range Status   MRSA by PCR NEGATIVE NEGATIVE Final     Comment:        The GeneXpert MRSA Assay (FDA approved for NASAL specimens only), is one component of a comprehensive MRSA colonization surveillance program. It is not intended to diagnose MRSA infection nor to guide or monitor treatment for MRSA infections.          Radiology Studies: Dg Chest Port 1 View  01/25/2016  CLINICAL DATA:  Pleural effusion. EXAM: PORTABLE CHEST 1 VIEW COMPARISON:  01/23/2016. FINDINGS: Prior CABG. Right innominate venous stent again noted . Surgical clips noted in the lower neck. Cardiomegaly. Interval partial clearing of lower lobe infiltrate and/or edema. Interim partial clearing of right-sided pleural effusion. Small left pleural effusion. No pneumothorax. IMPRESSION: 1.  Prior CABG.  Cardiomegaly. 2. Interval partial clearing of right lower lobe infiltrate/edema. Interim partial clearing of right pleural effusion. Small left pleural effusion noted. Electronically Signed   By: Marcello Moores  Register   On: 01/25/2016 07:28        Scheduled Meds: . amLODipine  2.5 mg Oral Daily  . antiseptic oral rinse  7 mL Mouth Rinse BID  . aspirin EC  81 mg Oral Daily  . atorvastatin  10 mg Oral q1800  . calcium carbonate  1 tablet Oral Q breakfast  . carvedilol  25 mg Oral BID WC  . colchicine  0.6 mg Oral Q14 Days  . darbepoetin (ARANESP) injection - DIALYSIS  200 mcg Intravenous Q Tue-HD  . insulin aspart  0-5 Units Subcutaneous QHS  . insulin aspart  0-9 Units Subcutaneous TID WC  . isosorbide mononitrate  30 mg Oral Daily  . piperacillin-tazobactam (ZOSYN)  IV  2.25 g Intravenous Q8H  . sodium chloride flush  3 mL Intravenous Q12H  . [START ON 01/26/2016] vancomycin  1,000 mg Intravenous Q T,Th,Sa-HD   Continuous Infusions:    LOS: 3 days    Time spent: 40 minutes    Khristie Sak, Geraldo Docker, MD [facility] Pager [phone]   If 7PM-7AM, please contact night-coverage [email or web address] Password [email or web address] 01/25/2016, 8:11 PM

## 2016-01-25 NOTE — Progress Notes (Signed)
Patrick [name] Progress Note   Subjective: feels "great", wants to go home.  Hb 10, afebrile, cough and SOB much better.  Repeat CXR today w clearing of most of RLL process   Filed Vitals:   01/25/16 0311 01/25/16 0400 01/25/16 0500 01/25/16 0841  BP: 136/93   146/99  Pulse:      Temp:  98.2 F (36.8 C)  98.3 F (36.8 C)  TempSrc:  Oral  Oral  Resp: 26   22  Height:      Weight:   92.7 kg (204 lb 5.9 oz)   SpO2: 93%   94%    Inpatient medications: . antiseptic oral rinse  7 mL Mouth Rinse BID  . aspirin EC  81 mg Oral Daily  . atorvastatin  10 mg Oral q1800  . calcium carbonate  1 tablet Oral Q breakfast  . colchicine  0.6 mg Oral Q14 Days  . darbepoetin (ARANESP) injection - DIALYSIS  200 mcg Intravenous Q Tue-HD  . insulin aspart  0-5 Units Subcutaneous QHS  . insulin aspart  0-9 Units Subcutaneous TID WC  . isosorbide mononitrate  30 mg Oral Daily  . midodrine  5 mg Oral TID WC  . piperacillin-tazobactam (ZOSYN)  IV  2.25 g Intravenous Q8H  . sodium chloride flush  3 mL Intravenous Q12H  . [START ON 01/26/2016] vancomycin  1,000 mg Intravenous Q T,Th,Sa-HD     acetaminophen, ipratropium-albuterol, ondansetron **OR** ondansetron (ZOFRAN) IV  Exam: Awake, on HD, feels bad, BP's low Chest clear bilat to bases RRR no MRG heard Abd soft ntnd obese, RLQ tx nontender, +bs no ascites GU normal male MS no joint effusions or deformity Ext no LE or UE edema / old accesses L arm and R arm RUA AVG+bruit, no LE wounds or ulcers Neuro is alert, Ox 3 , nf   Dialysis: [facility] 4h 90.5kg 2/2.5 bath P4 Hep 1600 RUA AVG Venof 50/wk Mircera 225 next due 6/27  Assessment: 1 RLL PNA/effusion - resolving, afeb, better 2 Volume - no vol excess 3 Anemia - recurrent, unclear cause, no gross GI bleed; better after prbc's, max darbe. Tsat 18%, consider Fe load when gets back to his OP HD unit 4 Hx failed renal Tx 5 ESRD HD TTS 6 DM not on any diabetic  meds 7 HTN was hypotensive, now BP's back up. DC midodrine, resume home BP meds  Plan - OK for dc, have d/w primary MD   Kelly Splinter MD [name] pager [phone]    cell [phone] 01/25/2016, 11:57 AM    Recent Labs Lab 01/22/16 0828 01/23/16 0320 01/24/16 0815 01/25/16 0326  NA  --  137 135 136  K  --  4.1 3.9 4.4  CL  --  99* 98* 99*  CO2  --  30 27 27   GLUCOSE  --  111* 92 77  BUN  --  21* 13 11  CREATININE  --  7.27* 6.47* 5.46*  CALCIUM  --  8.0* 7.9* 7.9*  PHOS 3.8  --   --   --     Recent Labs Lab 01/22/16 0735 01/23/16 0320  AST 24 27  ALT 10* 8*  ALKPHOS 62 69  BILITOT 1.0 1.1  PROT 7.2 6.8  ALBUMIN 3.2* 2.8*    Recent Labs Lab 01/22/16 0735 01/22/16 1720 01/23/16 0320 01/24/16 0815  WBC 8.6 5.7 5.3 6.2  NEUTROABS 5.5  --   --   --   HGB 5.5* 9.0*  9.5* 10.3*  HCT 18.1* 30.6* 31.7* 34.1*  MCV 85.0 86.4 86.6 89.3  PLT 213 190 179 179   Iron/TIBC/Ferritin/ %Sat    Component Value Date/Time   IRON 29* 01/24/2016 1435   TIBC 161* 01/24/2016 1435   FERRITIN 1048* 09/15/2014 1621   IRONPCTSAT 18 01/24/2016 1435

## 2016-01-26 DIAGNOSIS — I5021 Acute systolic (congestive) heart failure: Secondary | ICD-10-CM

## 2016-01-26 DIAGNOSIS — D638 Anemia in other chronic diseases classified elsewhere: Secondary | ICD-10-CM

## 2016-01-26 DIAGNOSIS — Z992 Dependence on renal dialysis: Secondary | ICD-10-CM

## 2016-01-26 DIAGNOSIS — N186 End stage renal disease: Secondary | ICD-10-CM | POA: Diagnosis present

## 2016-01-26 DIAGNOSIS — I150 Renovascular hypertension: Secondary | ICD-10-CM | POA: Diagnosis present

## 2016-01-26 LAB — GLUCOSE, CAPILLARY
GLUCOSE-CAPILLARY: 86 mg/dL (ref 65–99)
GLUCOSE-CAPILLARY: 103 mg/dL — AB (ref 65–99)

## 2016-01-26 MED ORDER — VANCOMYCIN HCL IN DEXTROSE 1-5 GM/200ML-% IV SOLN
INTRAVENOUS | Status: AC
  Filled 2016-01-26: qty 200

## 2016-01-26 MED ORDER — ISOSORBIDE MONONITRATE ER 30 MG PO TB24: 30.0000 mg | ORAL_TABLET | Freq: Every day | ORAL | Status: DC

## 2016-01-26 NOTE — Progress Notes (Signed)
Pt educated on d/c instructions. Able to teach back all information. Specifically went over medications, diet, appointments, and when to call the doctor or 911. No further questions at this time. D/c home with family. All belonging with pt.

## 2016-01-26 NOTE — Discharge Summary (Signed)
Physician Discharge Summary  Patrick [name] [id number] DOB: [birth year] DOA: 01/22/2016  PCP: Elyn Peers, MD  Admit date: 01/22/2016 Discharge date: 01/26/2016  Time spent: 35 minutes  Recommendations for Outpatient Follow-up:   Acute hypoxic respiratory failure -Appears to be due to pulmonary edema + PNA  -Completed course of antibiotics.  RLL Pneumonia(HCAP)/ Rt Pleural effusion  - Improved with HD, currently asymptomatic    Anemia of Chronic Disease ( baseline 8.3 on 11/23/2015) -hemoglobin 5.5 at presentation - no clear source at present  - iron studies; consistent with anemia of chronic disease - guaiac stool ; negative  Recent Labs Lab 01/22/16 0735 01/22/16 1720 01/23/16 0320 01/24/16 0815  HGB 5.5* 9.0* 9.5* 10.3*  - follow hemoglobin trend  - erythropoietin per nephrology  NSTEMI/Acute Systolic CHF -demand ischemia from severe acute anemia - Cardiology following - further acute eval at this time not likely be required -Strict in and out since admission -3.9 L -Daily weight Filed Weights   01/25/16 0500 01/26/16 0349 01/26/16 1330  Weight: 92.7 kg (204 lb 5.9 oz) 90.266 kg (199 lb) 89.3 kg (196 lb 13.9 oz)  -Echocardiogram when compared to echo from 11/29/2013 shows worsening EF, and increasing diastolic dysfunction. See results below. -Amlodipine 2.5 mg -Coreg 25 mg BID -Imdur 30 mg daily   ESRD on HD T/Th/Sat -Per Nephrology   DM Type 2 controlled with complication -[id number] hemoglobin A1c = 4.4   HTN -Well-controlled on current medication  Gout Quiescent    Discharge Diagnoses:  Active Problems:   End-stage renal failure with renal transplant (HCC)   Chest pain   S/P CABG x 1   Symptomatic anemia   Acute respiratory failure (HCC)   Acute diastolic (congestive) heart failure (HCC)   Elevated troponin   Febrile illness   Acute respiratory failure with hypoxia (HCC)   Controlled diabetes mellitus type 2 with complications (HCC)   Pleural  effusion   NSTEMI (non-ST elevated myocardial infarction) (HCC)   Acute systolic CHF (congestive heart failure) (HCC)   Anemia of chronic disease   End-stage renal disease on hemodialysis ([place])   Renovascular hypertension   Discharge Condition: Stable  Diet recommendation: Heart healthy/carb modified  Filed Weights   01/25/16 0500 01/26/16 0349 01/26/16 1330  Weight: 92.7 kg (204 lb 5.9 oz) 90.266 kg (199 lb) 89.3 kg (196 lb 13.9 oz)    History of present illness:  60 y.o. 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 During his hospitalization patient was treated for NSTEMI, HCAP, and hypotension. Patient was treated conservatively by cardiology for his NSTEMI which consisted of multiple HD sessions in order to ensure patient euvolemic, and adjusting cardiac medication. Patient's echocardiogram 6/30 when compared to echo from 11/29/2013 shows a decrease in EF as well as worsening of diastolic dysfunction.   Procedures: 6/30 echocardiogram;Left ventricle: -Mild concentric hypertrophy. -LVEF = 45% to 50%. Diffuse hypokinesis. -(grade 2 diastolic dysfunction).    Consultations: Dr.Jay [facility]   Cultures 6/27 blood left AC/hand NGTD 6/27 MRSA by PCR negative  Antibiotics Zosyn 6/27 > 7/1 Vanc 6/27>> 7/1    Discharge Exam: Filed Vitals:   01/26/16 1120 01/26/16 1330 01/26/16 1400 01/26/16 1430  BP: 136/84 144/80 91/62 106/63  Pulse:  74 76 74  Temp: 98 F (36.7 C) 98 F (36.7 C)  TempSrc: Oral Oral    Resp: 20 18 16 15   Height:      Weight:  89.3 kg (196 lb 13.9 oz)    SpO2: 98% 98%      General: A/O 4, NAD, No acute respiratory distress Eyes: negative scleral hemorrhage, negative anisocoria, negative icterus ENT: Negative Runny nose, negative gingival  bleeding, Neck: Negative scars, masses, torticollis, lymphadenopathy, JVD Lungs: Clear to auscultation bilaterally without wheezes or crackles Cardiovascular: Regular rate and rhythm without murmur gallop or rub normal S1 and S2   Discharge Instructions     Medication List    TAKE these medications        amLODipine 2.5 MG tablet  Commonly known as:  NORVASC  Take 2.5 mg by mouth daily.     aspirin 81 MG tablet  Take 81 mg by mouth daily.     atorvastatin 10 MG tablet  Commonly known as:  LIPITOR  Take 1 tablet (10 mg total) by mouth daily at 6 PM.     calcium carbonate 1250 (500 Ca) MG tablet  Commonly known as:  OS-CAL - dosed in mg of elemental calcium  Take 1 tablet by mouth daily with breakfast.     carvedilol 25 MG tablet  Commonly known as:  COREG  Take 25 mg by mouth 2 (two) times daily with a meal.     colchicine 0.6 MG tablet  Take 0.5 tablets (0.3 mg total) by mouth daily.     ipratropium-albuterol 0.5-2.5 (3) MG/3ML Soln  Commonly known as:  DUONEB  Take 3 mLs by nebulization every 4 (four) hours as needed.     isosorbide mononitrate 30 MG 24 hr tablet  Commonly known as:  IMDUR  Take 1 tablet (30 mg total) by mouth daily.       Allergies  Allergen Reactions  . Iodinated Diagnostic Agents Rash    Other Reaction: burning to mouth, swelling of lips.   Follow-up Information    Follow up with Elyn Peers, MD. Schedule an appointment as soon as possible for a visit in 2 weeks.   Specialty:  Family Medicine   Why:  Follow-up with Dr. Lucianne Lei in 1-2 weeks NSTEMI, HTN   Contact information:   [address] [place] [address] [phone]       Follow up with Adrian Prows, MD. Schedule an appointment as soon as possible for a visit in 1 week.   Specialty:  Cardiology   Why:  Follow-up with Dr. Adrian Prows in 1-2 weeks NSTEMI, HTN   Contact information:   [address] [place] [address] [phone]         The results of significant diagnostics from this hospitalization (including imaging, microbiology, ancillary and laboratory) are listed below for reference.    Significant Diagnostic Studies: Dg Chest 2 View  01/22/2016  CLINICAL DATA:  Shortness of breath and vomiting. Chronic renal failure EXAM: CHEST  2 VIEW COMPARISON:  November 17, 2015 chest radiograph and November 23, 2015 chest CT FINDINGS: There is a small left pleural effusion. There is mild left base atelectasis. Lungs elsewhere clear. Heart is enlarged with pulmonary vascularity within normal limits. Patient is status post median sternotomy. There is a stent in the right innominate vein region. No adenopathy. No bone lesions. IMPRESSION: Stable cardiomegaly. Small left effusion with left base atelectasis. Lungs elsewhere clear. Electronically Signed   By: Lowella Grip III M.D.   On: 01/22/2016 07:04  Nm Pulmonary Perf And Vent  01/22/2016  CLINICAL DATA:  Acute onset shortness of breath and chest pain. End-stage renal disease on dialysis. EXAM: NUCLEAR MEDICINE VENTILATION - PERFUSION LUNG SCAN TECHNIQUE: Ventilation images were obtained in multiple projections using inhaled aerosol Tc-50m DTPA. Perfusion images were obtained in multiple projections after intravenous injection of Tc-56m MAA. RADIOPHARMACEUTICALS:  31.0 mCi Technetium-38m DTPA aerosol inhalation and 4.2 mCi Technetium-23m MAA IV COMPARISON:  Chest radiograph on 01/22/2016 FINDINGS: Ventilation: Diffusely heterogeneous distribution of radiopharmaceutical throughout both lungs, which is nonspecific. Perfusion: No wedge shaped peripheral perfusion defects to suggest acute pulmonary embolism. IMPRESSION: No evidence of pulmonary embolism. Heterogeneous ventilatory activity noted throughout both lungs, nonspecific. Electronically Signed   By: Earle Gell M.D.   On: 01/22/2016 16:20   Dg Chest Port 1 View  01/25/2016  CLINICAL DATA:  Pleural effusion. EXAM: PORTABLE CHEST 1  VIEW COMPARISON:  01/23/2016. FINDINGS: Prior CABG. Right innominate venous stent again noted . Surgical clips noted in the lower neck. Cardiomegaly. Interval partial clearing of lower lobe infiltrate and/or edema. Interim partial clearing of right-sided pleural effusion. Small left pleural effusion. No pneumothorax. IMPRESSION: 1.  Prior CABG.  Cardiomegaly. 2. Interval partial clearing of right lower lobe infiltrate/edema. Interim partial clearing of right pleural effusion. Small left pleural effusion noted. Electronically Signed   By: Marcello Moores  Register   On: 01/25/2016 07:28   Dg Chest Port 1 View  01/23/2016  CLINICAL DATA:  Respiratory failure. EXAM: PORTABLE CHEST 1 VIEW COMPARISON:  01/22/2016.  CT 11/23/2015. FINDINGS: Patient rotated to the left. Prior CABG. Stable prominent cardiomegaly. Right innominate vein stent again noted. No pulmonary venous congestion. Right lower lobe infiltrate with right pleural effusion. No pneumothorax. IMPRESSION: 1.  Prior CABG.  Stable prominent cardiomegaly. 2. Developing right lower lobe infiltrate and right pleural effusion. Electronically Signed   By: Marcello Moores  Register   On: 01/23/2016 07:30    Microbiology: Recent Results (from the past 240 hour(s))  Blood Culture (routine x 2)     Status: None (Preliminary result)   Collection Time: 01/22/16  7:30 AM  Result Value Ref Range Status   Specimen Description BLOOD LEFT ANTECUBITAL  Final   Special Requests BOTTLES DRAWN AEROBIC AND ANAEROBIC  5CC  Final   Culture NO GROWTH 4 DAYS  Final   Report Status PENDING  Incomplete  Blood Culture (routine x 2)     Status: None (Preliminary result)   Collection Time: 01/22/16  7:35 AM  Result Value Ref Range Status   Specimen Description BLOOD LEFT HAND  Final   Special Requests BOTTLES DRAWN AEROBIC AND ANAEROBIC  5CC  Final   Culture NO GROWTH 4 DAYS  Final   Report Status PENDING  Incomplete  MRSA PCR Screening     Status: None   Collection Time: 01/22/16  5:03  PM  Result Value Ref Range Status   MRSA by PCR NEGATIVE NEGATIVE Final    Comment:        The GeneXpert MRSA Assay (FDA approved for NASAL specimens only), is one component of a comprehensive MRSA colonization surveillance program. It is not intended to diagnose MRSA infection nor to guide or monitor treatment for MRSA infections.      Labs: Basic Metabolic Panel:  Recent Labs Lab 01/22/16 0735 01/22/16 0828 01/23/16 0320 01/24/16 0815 01/25/16 0326  NA 136  --  137 135 136  K 5.3*  --  4.1 3.9 4.4  CL 98*  --  99* 98* 99*  CO2 22  --  30 27 27   GLUCOSE 84  --  111* 92 77  BUN 47*  --  21* 13 11  CREATININE 12.73*  --  7.27* 6.47* 5.46*  CALCIUM 9.3  --  8.0* 7.9* 7.9*  MG  --  1.8  --   --   --   PHOS  --  3.8  --   --   --    Liver Function Tests:  Recent Labs Lab 01/22/16 0735 01/23/16 0320  AST 24 27  ALT 10* 8*  ALKPHOS 62 69  BILITOT 1.0 1.1  PROT 7.2 6.8  ALBUMIN 3.2* 2.8*   No results for input(s): LIPASE, AMYLASE in the last 168 hours. No results for input(s): AMMONIA in the last 168 hours. CBC:  Recent Labs Lab 01/22/16 0735 01/22/16 1720 01/23/16 0320 01/24/16 0815  WBC 8.6 5.7 5.3 6.2  NEUTROABS 5.5  --   --   --   HGB 5.5* 9.0* 9.5* 10.3*  HCT 18.1* 30.6* 31.7* 34.1*  MCV 85.0 86.4 86.6 89.3  PLT 213 190 179 179   Cardiac Enzymes:  Recent Labs Lab 01/22/16 0828 01/22/16 1720 01/22/16 2236  TROPONINI 3.50* 5.01* 4.49*   BNP: BNP (last 3 results)  Recent Labs  10/30/15 0855 11/17/15 0759 01/22/16 0735  BNP 2633.8* 2608.9* >4500.0*    ProBNP (last 3 results) No results for input(s): PROBNP in the last 8760 hours.  CBG:  Recent Labs Lab 01/25/16 1239 01/25/16 1710 01/25/16 2148 01/26/16 0831 01/26/16 1124  GLUCAP 76 82 81 86 103*       Signed:  Dia Crawford, MD [facility] [phone] pager

## 2016-01-26 NOTE — Progress Notes (Signed)
[facility] Progress Note   Subjective: LVEF 45-50% on echo yest, no vegetations, last echo in 2015 was LVEF 50-55%. Pt anxious to go home  Filed Vitals:   01/25/16 2312 01/26/16 0349 01/26/16 0754 01/26/16 0827  BP: 151/89 158/94 145/104   Pulse:   81   Temp: 98.4 F (36.9 C) 98.2 F (36.8 C)  98.2 F (36.8 C)  TempSrc: Oral Oral  Oral  Resp: 13 16  24   Height:      Weight:  90.266 kg (199 lb)    SpO2: 95% 95%  96%    Inpatient medications: . amLODipine  2.5 mg Oral Daily  . antiseptic oral rinse  7 mL Mouth Rinse BID  . aspirin EC  81 mg Oral Daily  . atorvastatin  10 mg Oral q1800  . calcium carbonate  1 tablet Oral Q breakfast  . carvedilol  25 mg Oral BID WC  . colchicine  0.6 mg Oral Q14 Days  . darbepoetin (ARANESP) injection - DIALYSIS  200 mcg Intravenous Q Tue-HD  . insulin aspart  0-5 Units Subcutaneous QHS  . insulin aspart  0-9 Units Subcutaneous TID WC  . isosorbide mononitrate  30 mg Oral Daily  . piperacillin-tazobactam (ZOSYN)  IV  2.25 g Intravenous Q8H  . sodium chloride flush  3 mL Intravenous Q12H  . vancomycin  1,000 mg Intravenous Q T,Th,Sa-HD     acetaminophen, ipratropium-albuterol, ondansetron **OR** ondansetron (ZOFRAN) IV  Exam: Alert, in good spirits Chest clear bilat to bases RRR no MRG heard Abd soft ntnd obese, RLQ tx nontender, +bs no ascites GU normal male MS no joint effusions or deformity Ext no LE or UE edema / old accesses L arm and R arm RUA AVG+bruit, no LE wounds or ulcers Neuro is alert, Ox 3 , nf   Dialysis: [facility] 4h 90.5kg 2/2.5 bath P4 Hep 1600 RUA AVG Venof 50/wk Mircera 225 next due 6/27  Assessment: 1 RLL PNA/effusion - resolving, afeb, better 2 Volume - no vol excess 3 Anemia - recurrent, unclear cause, no gross GI bleed; better after prbc's, max darbe. Tsat 18%, consider Fe load when gets back to his OP HD unit 4 Hx failed renal Tx 5 ESRD HD TTS 6 DM not on any diabetic  meds 7 HTN was hypotensive, now BP's back up. DC'd midodrine, resume home BP meds  Plan - HD today before dc    Kelly Splinter MD [facility] pager [phone]    cell [phone] 01/26/2016, 10:42 AM    Recent Labs Lab 01/22/16 0828 01/23/16 0320 01/24/16 0815 01/25/16 0326  NA  --  137 135 136  K  --  4.1 3.9 4.4  CL  --  99* 98* 99*  CO2  --  30 27 27   GLUCOSE  --  111* 92 77  BUN  --  21* 13 11  CREATININE  --  7.27* 6.47* 5.46*  CALCIUM  --  8.0* 7.9* 7.9*  PHOS 3.8  --   --   --     Recent Labs Lab 01/22/16 0735 01/23/16 0320  AST 24 27  ALT 10* 8*  ALKPHOS 62 69  BILITOT 1.0 1.1  PROT 7.2 6.8  ALBUMIN 3.2* 2.8*    Recent Labs Lab 01/22/16 0735 01/22/16 1720 01/23/16 0320 01/24/16 0815  WBC 8.6 5.7 5.3 6.2  NEUTROABS 5.5  --   --   --   HGB 5.5* 9.0* 9.5* 10.3*  HCT 18.1* 30.6* 31.7*  34.1*  MCV 85.0 86.4 86.6 89.3  PLT 213 190 179 179   Iron/TIBC/Ferritin/ %Sat    Component Value Date/Time   IRON 29* 01/24/2016 1435   TIBC 161* 01/24/2016 1435   FERRITIN 1048* 09/15/2014 1621   IRONPCTSAT 18 01/24/2016 1435

## 2016-01-27 LAB — CULTURE, BLOOD (ROUTINE X 2)
CULTURE: NO GROWTH
CULTURE: NO GROWTH

## 2016-01-29 DIAGNOSIS — D509 Iron deficiency anemia, unspecified: Secondary | ICD-10-CM | POA: Diagnosis not present

## 2016-01-29 DIAGNOSIS — N186 End stage renal disease: Secondary | ICD-10-CM | POA: Diagnosis not present

## 2016-01-29 DIAGNOSIS — E1029 Type 1 diabetes mellitus with other diabetic kidney complication: Secondary | ICD-10-CM | POA: Diagnosis not present

## 2016-01-29 DIAGNOSIS — D631 Anemia in chronic kidney disease: Secondary | ICD-10-CM | POA: Diagnosis not present

## 2016-01-31 DIAGNOSIS — D509 Iron deficiency anemia, unspecified: Secondary | ICD-10-CM | POA: Diagnosis not present

## 2016-01-31 DIAGNOSIS — N186 End stage renal disease: Secondary | ICD-10-CM | POA: Diagnosis not present

## 2016-01-31 DIAGNOSIS — D631 Anemia in chronic kidney disease: Secondary | ICD-10-CM | POA: Diagnosis not present

## 2016-01-31 DIAGNOSIS — E1029 Type 1 diabetes mellitus with other diabetic kidney complication: Secondary | ICD-10-CM | POA: Diagnosis not present

## 2016-02-02 DIAGNOSIS — D631 Anemia in chronic kidney disease: Secondary | ICD-10-CM | POA: Diagnosis not present

## 2016-02-02 DIAGNOSIS — E1029 Type 1 diabetes mellitus with other diabetic kidney complication: Secondary | ICD-10-CM | POA: Diagnosis not present

## 2016-02-02 DIAGNOSIS — N186 End stage renal disease: Secondary | ICD-10-CM | POA: Diagnosis not present

## 2016-02-02 DIAGNOSIS — D509 Iron deficiency anemia, unspecified: Secondary | ICD-10-CM | POA: Diagnosis not present

## 2016-02-05 DIAGNOSIS — D631 Anemia in chronic kidney disease: Secondary | ICD-10-CM | POA: Diagnosis not present

## 2016-02-05 DIAGNOSIS — N186 End stage renal disease: Secondary | ICD-10-CM | POA: Diagnosis not present

## 2016-02-05 DIAGNOSIS — D509 Iron deficiency anemia, unspecified: Secondary | ICD-10-CM | POA: Diagnosis not present

## 2016-02-05 DIAGNOSIS — E1029 Type 1 diabetes mellitus with other diabetic kidney complication: Secondary | ICD-10-CM | POA: Diagnosis not present

## 2016-02-07 DIAGNOSIS — D631 Anemia in chronic kidney disease: Secondary | ICD-10-CM | POA: Diagnosis not present

## 2016-02-07 DIAGNOSIS — E1029 Type 1 diabetes mellitus with other diabetic kidney complication: Secondary | ICD-10-CM | POA: Diagnosis not present

## 2016-02-07 DIAGNOSIS — D509 Iron deficiency anemia, unspecified: Secondary | ICD-10-CM | POA: Diagnosis not present

## 2016-02-07 DIAGNOSIS — N186 End stage renal disease: Secondary | ICD-10-CM | POA: Diagnosis not present

## 2016-02-08 DIAGNOSIS — D631 Anemia in chronic kidney disease: Secondary | ICD-10-CM | POA: Diagnosis not present

## 2016-02-08 DIAGNOSIS — D509 Iron deficiency anemia, unspecified: Secondary | ICD-10-CM | POA: Diagnosis not present

## 2016-02-08 DIAGNOSIS — E1029 Type 1 diabetes mellitus with other diabetic kidney complication: Secondary | ICD-10-CM | POA: Diagnosis not present

## 2016-02-08 DIAGNOSIS — N186 End stage renal disease: Secondary | ICD-10-CM | POA: Diagnosis not present

## 2016-02-12 DIAGNOSIS — N186 End stage renal disease: Secondary | ICD-10-CM | POA: Diagnosis not present

## 2016-02-12 DIAGNOSIS — D509 Iron deficiency anemia, unspecified: Secondary | ICD-10-CM | POA: Diagnosis not present

## 2016-02-12 DIAGNOSIS — N2581 Secondary hyperparathyroidism of renal origin: Secondary | ICD-10-CM | POA: Diagnosis not present

## 2016-02-14 DIAGNOSIS — N2581 Secondary hyperparathyroidism of renal origin: Secondary | ICD-10-CM | POA: Diagnosis not present

## 2016-02-14 DIAGNOSIS — N186 End stage renal disease: Secondary | ICD-10-CM | POA: Diagnosis not present

## 2016-02-14 DIAGNOSIS — D509 Iron deficiency anemia, unspecified: Secondary | ICD-10-CM | POA: Diagnosis not present

## 2016-02-16 DIAGNOSIS — E1029 Type 1 diabetes mellitus with other diabetic kidney complication: Secondary | ICD-10-CM | POA: Diagnosis not present

## 2016-02-16 DIAGNOSIS — N186 End stage renal disease: Secondary | ICD-10-CM | POA: Diagnosis not present

## 2016-02-16 DIAGNOSIS — D509 Iron deficiency anemia, unspecified: Secondary | ICD-10-CM | POA: Diagnosis not present

## 2016-02-16 DIAGNOSIS — D631 Anemia in chronic kidney disease: Secondary | ICD-10-CM | POA: Diagnosis not present

## 2016-02-19 DIAGNOSIS — E1029 Type 1 diabetes mellitus with other diabetic kidney complication: Secondary | ICD-10-CM | POA: Diagnosis not present

## 2016-02-19 DIAGNOSIS — D509 Iron deficiency anemia, unspecified: Secondary | ICD-10-CM | POA: Diagnosis not present

## 2016-02-19 DIAGNOSIS — N186 End stage renal disease: Secondary | ICD-10-CM | POA: Diagnosis not present

## 2016-02-19 DIAGNOSIS — D631 Anemia in chronic kidney disease: Secondary | ICD-10-CM | POA: Diagnosis not present

## 2016-02-21 DIAGNOSIS — N186 End stage renal disease: Secondary | ICD-10-CM | POA: Diagnosis not present

## 2016-02-21 DIAGNOSIS — E1029 Type 1 diabetes mellitus with other diabetic kidney complication: Secondary | ICD-10-CM | POA: Diagnosis not present

## 2016-02-21 DIAGNOSIS — D509 Iron deficiency anemia, unspecified: Secondary | ICD-10-CM | POA: Diagnosis not present

## 2016-02-21 DIAGNOSIS — D631 Anemia in chronic kidney disease: Secondary | ICD-10-CM | POA: Diagnosis not present

## 2016-02-23 DIAGNOSIS — N186 End stage renal disease: Secondary | ICD-10-CM | POA: Diagnosis not present

## 2016-02-23 DIAGNOSIS — D509 Iron deficiency anemia, unspecified: Secondary | ICD-10-CM | POA: Diagnosis not present

## 2016-02-23 DIAGNOSIS — D631 Anemia in chronic kidney disease: Secondary | ICD-10-CM | POA: Diagnosis not present

## 2016-02-23 DIAGNOSIS — E1029 Type 1 diabetes mellitus with other diabetic kidney complication: Secondary | ICD-10-CM | POA: Diagnosis not present

## 2016-02-25 DIAGNOSIS — Z992 Dependence on renal dialysis: Secondary | ICD-10-CM | POA: Diagnosis not present

## 2016-02-25 DIAGNOSIS — T8610 Unspecified complication of kidney transplant: Secondary | ICD-10-CM | POA: Diagnosis not present

## 2016-02-25 DIAGNOSIS — N186 End stage renal disease: Secondary | ICD-10-CM | POA: Diagnosis not present

## 2016-02-26 DIAGNOSIS — D631 Anemia in chronic kidney disease: Secondary | ICD-10-CM | POA: Diagnosis not present

## 2016-02-26 DIAGNOSIS — E1029 Type 1 diabetes mellitus with other diabetic kidney complication: Secondary | ICD-10-CM | POA: Diagnosis not present

## 2016-02-26 DIAGNOSIS — N186 End stage renal disease: Secondary | ICD-10-CM | POA: Diagnosis not present

## 2016-02-26 DIAGNOSIS — D509 Iron deficiency anemia, unspecified: Secondary | ICD-10-CM | POA: Diagnosis not present

## 2016-02-28 DIAGNOSIS — E1029 Type 1 diabetes mellitus with other diabetic kidney complication: Secondary | ICD-10-CM | POA: Diagnosis not present

## 2016-02-28 DIAGNOSIS — N186 End stage renal disease: Secondary | ICD-10-CM | POA: Diagnosis not present

## 2016-02-28 DIAGNOSIS — D631 Anemia in chronic kidney disease: Secondary | ICD-10-CM | POA: Diagnosis not present

## 2016-02-28 DIAGNOSIS — D509 Iron deficiency anemia, unspecified: Secondary | ICD-10-CM | POA: Diagnosis not present

## 2016-03-01 DIAGNOSIS — D631 Anemia in chronic kidney disease: Secondary | ICD-10-CM | POA: Diagnosis not present

## 2016-03-01 DIAGNOSIS — D509 Iron deficiency anemia, unspecified: Secondary | ICD-10-CM | POA: Diagnosis not present

## 2016-03-01 DIAGNOSIS — N186 End stage renal disease: Secondary | ICD-10-CM | POA: Diagnosis not present

## 2016-03-01 DIAGNOSIS — E1029 Type 1 diabetes mellitus with other diabetic kidney complication: Secondary | ICD-10-CM | POA: Diagnosis not present

## 2016-03-04 DIAGNOSIS — E1029 Type 1 diabetes mellitus with other diabetic kidney complication: Secondary | ICD-10-CM | POA: Diagnosis not present

## 2016-03-04 DIAGNOSIS — D509 Iron deficiency anemia, unspecified: Secondary | ICD-10-CM | POA: Diagnosis not present

## 2016-03-04 DIAGNOSIS — D631 Anemia in chronic kidney disease: Secondary | ICD-10-CM | POA: Diagnosis not present

## 2016-03-04 DIAGNOSIS — N186 End stage renal disease: Secondary | ICD-10-CM | POA: Diagnosis not present

## 2016-03-06 DIAGNOSIS — N186 End stage renal disease: Secondary | ICD-10-CM | POA: Diagnosis not present

## 2016-03-06 DIAGNOSIS — E1029 Type 1 diabetes mellitus with other diabetic kidney complication: Secondary | ICD-10-CM | POA: Diagnosis not present

## 2016-03-06 DIAGNOSIS — D631 Anemia in chronic kidney disease: Secondary | ICD-10-CM | POA: Diagnosis not present

## 2016-03-06 DIAGNOSIS — D509 Iron deficiency anemia, unspecified: Secondary | ICD-10-CM | POA: Diagnosis not present

## 2016-03-08 DIAGNOSIS — D631 Anemia in chronic kidney disease: Secondary | ICD-10-CM | POA: Diagnosis not present

## 2016-03-08 DIAGNOSIS — E1029 Type 1 diabetes mellitus with other diabetic kidney complication: Secondary | ICD-10-CM | POA: Diagnosis not present

## 2016-03-08 DIAGNOSIS — N186 End stage renal disease: Secondary | ICD-10-CM | POA: Diagnosis not present

## 2016-03-08 DIAGNOSIS — D509 Iron deficiency anemia, unspecified: Secondary | ICD-10-CM | POA: Diagnosis not present

## 2016-03-11 DIAGNOSIS — N186 End stage renal disease: Secondary | ICD-10-CM | POA: Diagnosis not present

## 2016-03-11 DIAGNOSIS — D509 Iron deficiency anemia, unspecified: Secondary | ICD-10-CM | POA: Diagnosis not present

## 2016-03-11 DIAGNOSIS — D631 Anemia in chronic kidney disease: Secondary | ICD-10-CM | POA: Diagnosis not present

## 2016-03-11 DIAGNOSIS — E1029 Type 1 diabetes mellitus with other diabetic kidney complication: Secondary | ICD-10-CM | POA: Diagnosis not present

## 2016-03-13 DIAGNOSIS — D509 Iron deficiency anemia, unspecified: Secondary | ICD-10-CM | POA: Diagnosis not present

## 2016-03-13 DIAGNOSIS — N186 End stage renal disease: Secondary | ICD-10-CM | POA: Diagnosis not present

## 2016-03-13 DIAGNOSIS — D631 Anemia in chronic kidney disease: Secondary | ICD-10-CM | POA: Diagnosis not present

## 2016-03-13 DIAGNOSIS — E1029 Type 1 diabetes mellitus with other diabetic kidney complication: Secondary | ICD-10-CM | POA: Diagnosis not present

## 2016-03-15 DIAGNOSIS — E1029 Type 1 diabetes mellitus with other diabetic kidney complication: Secondary | ICD-10-CM | POA: Diagnosis not present

## 2016-03-15 DIAGNOSIS — N186 End stage renal disease: Secondary | ICD-10-CM | POA: Diagnosis not present

## 2016-03-15 DIAGNOSIS — D631 Anemia in chronic kidney disease: Secondary | ICD-10-CM | POA: Diagnosis not present

## 2016-03-15 DIAGNOSIS — D509 Iron deficiency anemia, unspecified: Secondary | ICD-10-CM | POA: Diagnosis not present

## 2016-03-18 DIAGNOSIS — D509 Iron deficiency anemia, unspecified: Secondary | ICD-10-CM | POA: Diagnosis not present

## 2016-03-18 DIAGNOSIS — D631 Anemia in chronic kidney disease: Secondary | ICD-10-CM | POA: Diagnosis not present

## 2016-03-18 DIAGNOSIS — N186 End stage renal disease: Secondary | ICD-10-CM | POA: Diagnosis not present

## 2016-03-18 DIAGNOSIS — E1029 Type 1 diabetes mellitus with other diabetic kidney complication: Secondary | ICD-10-CM | POA: Diagnosis not present

## 2016-03-21 DIAGNOSIS — I871 Compression of vein: Secondary | ICD-10-CM | POA: Diagnosis not present

## 2016-03-21 DIAGNOSIS — E1029 Type 1 diabetes mellitus with other diabetic kidney complication: Secondary | ICD-10-CM | POA: Diagnosis not present

## 2016-03-21 DIAGNOSIS — Z992 Dependence on renal dialysis: Secondary | ICD-10-CM | POA: Diagnosis not present

## 2016-03-21 DIAGNOSIS — D509 Iron deficiency anemia, unspecified: Secondary | ICD-10-CM | POA: Diagnosis not present

## 2016-03-21 DIAGNOSIS — N186 End stage renal disease: Secondary | ICD-10-CM | POA: Diagnosis not present

## 2016-03-21 DIAGNOSIS — D631 Anemia in chronic kidney disease: Secondary | ICD-10-CM | POA: Diagnosis not present

## 2016-03-21 DIAGNOSIS — T82858D Stenosis of vascular prosthetic devices, implants and grafts, subsequent encounter: Secondary | ICD-10-CM | POA: Diagnosis not present

## 2016-03-22 DIAGNOSIS — N186 End stage renal disease: Secondary | ICD-10-CM | POA: Diagnosis not present

## 2016-03-22 DIAGNOSIS — D631 Anemia in chronic kidney disease: Secondary | ICD-10-CM | POA: Diagnosis not present

## 2016-03-22 DIAGNOSIS — E1029 Type 1 diabetes mellitus with other diabetic kidney complication: Secondary | ICD-10-CM | POA: Diagnosis not present

## 2016-03-22 DIAGNOSIS — D509 Iron deficiency anemia, unspecified: Secondary | ICD-10-CM | POA: Diagnosis not present

## 2016-03-24 ENCOUNTER — Telehealth: Payer: Self-pay | Admitting: Endocrinology

## 2016-03-24 NOTE — Telephone Encounter (Signed)
repatha needs refill to [facility] he has two day left

## 2016-03-24 NOTE — Telephone Encounter (Signed)
Please refill x 1 Ov is due  

## 2016-03-24 NOTE — Telephone Encounter (Signed)
See message and please advise if ok to refill. I could not locate on current med list. Thanks!

## 2016-03-25 ENCOUNTER — Telehealth: Payer: Self-pay

## 2016-03-25 DIAGNOSIS — N186 End stage renal disease: Secondary | ICD-10-CM | POA: Diagnosis not present

## 2016-03-25 DIAGNOSIS — D631 Anemia in chronic kidney disease: Secondary | ICD-10-CM | POA: Diagnosis not present

## 2016-03-25 DIAGNOSIS — D509 Iron deficiency anemia, unspecified: Secondary | ICD-10-CM | POA: Diagnosis not present

## 2016-03-25 DIAGNOSIS — E1029 Type 1 diabetes mellitus with other diabetic kidney complication: Secondary | ICD-10-CM | POA: Diagnosis not present

## 2016-03-25 MED ORDER — REPAGLINIDE 0.5 MG PO TABS: 0.5000 mg | ORAL_TABLET | Freq: Two times a day (BID) | ORAL | 11 refills | Status: DC

## 2016-03-25 NOTE — Telephone Encounter (Signed)
It is in historical meds. I refilled

## 2016-03-25 NOTE — Telephone Encounter (Signed)
Could you please verify the dosage of this medication? I could not find the amount to prescribe for the patient on his current medication list. Thanks!

## 2016-03-25 NOTE — Telephone Encounter (Signed)
I am not the prescriber of this med 

## 2016-03-25 NOTE — Telephone Encounter (Signed)
Pt called and stated the refill he is in need of is Repaglinide. Please advise if ok to refill. Medication is not on current med list. Thanks!

## 2016-03-25 NOTE — Telephone Encounter (Signed)
Please refill x 1 Ov is due  

## 2016-03-25 NOTE — Telephone Encounter (Signed)
I did not find it in his medication history, either

## 2016-03-25 NOTE — Telephone Encounter (Signed)
I contacted the patient left a voicemail and advised the patient per Dr. Loanne Drilling to contact the prescribing MD.

## 2016-03-26 ENCOUNTER — Ambulatory Visit (INDEPENDENT_AMBULATORY_CARE_PROVIDER_SITE_OTHER): Payer: Medicare Other | Admitting: Endocrinology

## 2016-03-26 ENCOUNTER — Encounter: Payer: Self-pay | Admitting: Endocrinology

## 2016-03-26 VITALS — BP 122/72 | HR 71 | Ht 66.5 in | Wt 210.0 lb

## 2016-03-26 DIAGNOSIS — N184 Chronic kidney disease, stage 4 (severe): Secondary | ICD-10-CM

## 2016-03-26 DIAGNOSIS — E1122 Type 2 diabetes mellitus with diabetic chronic kidney disease: Secondary | ICD-10-CM | POA: Diagnosis not present

## 2016-03-26 DIAGNOSIS — R059 Cough, unspecified: Secondary | ICD-10-CM

## 2016-03-26 DIAGNOSIS — R05 Cough: Secondary | ICD-10-CM

## 2016-03-26 LAB — POCT GLYCOSYLATED HEMOGLOBIN (HGB A1C): Hemoglobin A1C: 5.1

## 2016-03-26 NOTE — Progress Notes (Signed)
Subjective:    Patient ID: Patrick [name], male    DOB: 1955-09-25, 60 y.o.   MRN: [id number]  HPI Pt returns for f/u of diabetes mellitus: DM type: 2 Dx'ed: 2014, when he presented with severe hyperglycemia.   Complications: polyneuropathy and renal failure.  Therapy: repaglinide.  DKA: never. Severe hypoglycemia: never. Pancreatitis: never. Other: he took insulin 2014-2016; he had renal transplant in 2009, but renal failure has recurred.  He is back on dialysis. Interval history: pt states he feels well in general. no cbg record, but states cbg's are well-controlled.  Past Medical History:  Diagnosis Date  . Diabetes mellitus without complication ([place])   . End-stage renal failure with renal transplant ([place])   . Gout   . Hypertension   . Impotence of organic origin   . Secondary hyperparathyroidism ([place])   . Type I (juvenile type) diabetes mellitus with renal manifestations, not stated as uncontrolled     Past Surgical History:  Procedure Laterality Date  . APPENDECTOMY    . AV FISTULA PLACEMENT Right 09/18/2014   Procedure: INSERTION OF ARTERIOVENOUS (AV) GORE-TEX GRAFT ARM USING 4-7MM  X 45CM STRETCH GORE-TEX VASCULAR GRAFT;  Surgeon: Rosetta Posner, MD;  Location: [facility];  Service: Vascular;  Laterality: Right;  . birth defect heart surgery  1996  . KIDNEY TRANSPLANT  2009    Social History   Social History  . Marital status: Married    Spouse name: N/A  . Number of children: N/A  . Years of education: N/A   Occupational History  . Not on file.   Social History Main Topics  . Smoking status: Former Research scientist (life sciences)  . Smokeless tobacco: Not on file  . Alcohol use No  . Drug use: No  . Sexual activity: Not Currently    Birth control/ protection: None   Other Topics Concern  . Not on file   Social History Narrative  . No narrative on file    Current Outpatient Prescriptions on File Prior to Visit  Medication Sig Dispense Refill  . amLODipine (NORVASC) 2.5 MG tablet  Take 2.5 mg by mouth daily.    Marland Kitchen aspirin 81 MG tablet Take 81 mg by mouth daily.    Marland Kitchen atorvastatin (LIPITOR) 10 MG tablet Take 1 tablet (10 mg total) by mouth daily at 6 PM.    . calcium carbonate (OS-CAL - DOSED IN MG OF ELEMENTAL CALCIUM) 1250 (500 Ca) MG tablet Take 1 tablet by mouth daily with breakfast.    . carvedilol (COREG) 25 MG tablet Take 25 mg by mouth 2 (two) times daily with a meal.    . colchicine 0.6 MG tablet Take 0.5 tablets (0.3 mg total) by mouth daily. (Patient taking differently: Take 0.6 mg by mouth every 14 (fourteen) days. ) 30 tablet 0  . ipratropium-albuterol (DUONEB) 0.5-2.5 (3) MG/3ML SOLN Take 3 mLs by nebulization every 4 (four) hours as needed. 360 mL   . isosorbide mononitrate (IMDUR) 30 MG 24 hr tablet Take 1 tablet (30 mg total) by mouth daily. 30 tablet 0  . repaglinide (PRANDIN) 0.5 MG tablet Take 1 tablet (0.5 mg total) by mouth 2 (two) times daily before a meal. 60 tablet 11   No current facility-administered medications on file prior to visit.     Allergies  Allergen Reactions  . Iodinated Diagnostic Agents Rash    Other Reaction: burning to mouth, swelling of lips.    Family History  Problem Relation Age of Onset  . Hyperlipidemia  Mother   . Hypertension Mother     BP 122/72   Pulse 71   Ht 5' 6.5" (1.689 m)   Wt 210 lb (95.3 kg)   BMI 33.39 kg/m    Review of Systems He denies hypoglycemia    Objective:   Physical Exam VITAL SIGNS:  See vs page GENERAL: no distress Pulses: dorsalis pedis intact bilat.   MSK: no deformity of the feet CV: no leg edema Skin:  no ulcer on the feet.  normal color and temp on the feet. Neuro: sensation is intact to touch on the feet  Lab Results  Component Value Date   HGBA1C 5.1 03/26/2016      Assessment & Plan:  Type 2 DM: overcontrolled Renal failure: he is at risk for hypoglycemia, so a goal a1c would be in the 7's

## 2016-03-26 NOTE — Patient Instructions (Signed)
blood tests are requested for you today.  We'll let you know about the results. Based on the results, you may be able to stop taking the repaglinide.   check your blood sugar once a day.  vary the time of day when you check, between before the 3 meals, and at bedtime.  also check if you have symptoms of your blood sugar being too high or too low.  please keep a record of the readings and bring it to your next appointment here.  You can write it on any piece of paper.  please call us sooner if your blood sugar goes below 70, or if you have a lot of readings over 200. Please come back for a follow-up appointment in 3 months.

## 2016-03-27 DIAGNOSIS — Z992 Dependence on renal dialysis: Secondary | ICD-10-CM | POA: Diagnosis not present

## 2016-03-27 DIAGNOSIS — T8610 Unspecified complication of kidney transplant: Secondary | ICD-10-CM | POA: Diagnosis not present

## 2016-03-27 DIAGNOSIS — E1029 Type 1 diabetes mellitus with other diabetic kidney complication: Secondary | ICD-10-CM | POA: Diagnosis not present

## 2016-03-27 DIAGNOSIS — D509 Iron deficiency anemia, unspecified: Secondary | ICD-10-CM | POA: Diagnosis not present

## 2016-03-27 DIAGNOSIS — N186 End stage renal disease: Secondary | ICD-10-CM | POA: Diagnosis not present

## 2016-03-27 DIAGNOSIS — D631 Anemia in chronic kidney disease: Secondary | ICD-10-CM | POA: Diagnosis not present

## 2016-03-28 LAB — FRUCTOSAMINE: Fructosamine: 298 umol/L — ABNORMAL HIGH (ref 190–270)

## 2016-03-29 DIAGNOSIS — N186 End stage renal disease: Secondary | ICD-10-CM | POA: Diagnosis not present

## 2016-03-29 DIAGNOSIS — E1029 Type 1 diabetes mellitus with other diabetic kidney complication: Secondary | ICD-10-CM | POA: Diagnosis not present

## 2016-03-29 DIAGNOSIS — D631 Anemia in chronic kidney disease: Secondary | ICD-10-CM | POA: Diagnosis not present

## 2016-03-29 DIAGNOSIS — D509 Iron deficiency anemia, unspecified: Secondary | ICD-10-CM | POA: Diagnosis not present

## 2016-04-01 DIAGNOSIS — N186 End stage renal disease: Secondary | ICD-10-CM | POA: Diagnosis not present

## 2016-04-01 DIAGNOSIS — E1029 Type 1 diabetes mellitus with other diabetic kidney complication: Secondary | ICD-10-CM | POA: Diagnosis not present

## 2016-04-01 DIAGNOSIS — D631 Anemia in chronic kidney disease: Secondary | ICD-10-CM | POA: Diagnosis not present

## 2016-04-01 DIAGNOSIS — D509 Iron deficiency anemia, unspecified: Secondary | ICD-10-CM | POA: Diagnosis not present

## 2016-04-03 DIAGNOSIS — D509 Iron deficiency anemia, unspecified: Secondary | ICD-10-CM | POA: Diagnosis not present

## 2016-04-03 DIAGNOSIS — D631 Anemia in chronic kidney disease: Secondary | ICD-10-CM | POA: Diagnosis not present

## 2016-04-03 DIAGNOSIS — N186 End stage renal disease: Secondary | ICD-10-CM | POA: Diagnosis not present

## 2016-04-03 DIAGNOSIS — E1029 Type 1 diabetes mellitus with other diabetic kidney complication: Secondary | ICD-10-CM | POA: Diagnosis not present

## 2016-04-05 DIAGNOSIS — N186 End stage renal disease: Secondary | ICD-10-CM | POA: Diagnosis not present

## 2016-04-05 DIAGNOSIS — D631 Anemia in chronic kidney disease: Secondary | ICD-10-CM | POA: Diagnosis not present

## 2016-04-05 DIAGNOSIS — E1029 Type 1 diabetes mellitus with other diabetic kidney complication: Secondary | ICD-10-CM | POA: Diagnosis not present

## 2016-04-05 DIAGNOSIS — D509 Iron deficiency anemia, unspecified: Secondary | ICD-10-CM | POA: Diagnosis not present

## 2016-04-08 DIAGNOSIS — E1029 Type 1 diabetes mellitus with other diabetic kidney complication: Secondary | ICD-10-CM | POA: Diagnosis not present

## 2016-04-08 DIAGNOSIS — D509 Iron deficiency anemia, unspecified: Secondary | ICD-10-CM | POA: Diagnosis not present

## 2016-04-08 DIAGNOSIS — D631 Anemia in chronic kidney disease: Secondary | ICD-10-CM | POA: Diagnosis not present

## 2016-04-08 DIAGNOSIS — N186 End stage renal disease: Secondary | ICD-10-CM | POA: Diagnosis not present

## 2016-04-10 DIAGNOSIS — D631 Anemia in chronic kidney disease: Secondary | ICD-10-CM | POA: Diagnosis not present

## 2016-04-10 DIAGNOSIS — E1029 Type 1 diabetes mellitus with other diabetic kidney complication: Secondary | ICD-10-CM | POA: Diagnosis not present

## 2016-04-10 DIAGNOSIS — N186 End stage renal disease: Secondary | ICD-10-CM | POA: Diagnosis not present

## 2016-04-10 DIAGNOSIS — D509 Iron deficiency anemia, unspecified: Secondary | ICD-10-CM | POA: Diagnosis not present

## 2016-04-12 DIAGNOSIS — E1029 Type 1 diabetes mellitus with other diabetic kidney complication: Secondary | ICD-10-CM | POA: Diagnosis not present

## 2016-04-12 DIAGNOSIS — N186 End stage renal disease: Secondary | ICD-10-CM | POA: Diagnosis not present

## 2016-04-12 DIAGNOSIS — D631 Anemia in chronic kidney disease: Secondary | ICD-10-CM | POA: Diagnosis not present

## 2016-04-12 DIAGNOSIS — D509 Iron deficiency anemia, unspecified: Secondary | ICD-10-CM | POA: Diagnosis not present

## 2016-04-15 DIAGNOSIS — N186 End stage renal disease: Secondary | ICD-10-CM | POA: Diagnosis not present

## 2016-04-15 DIAGNOSIS — D509 Iron deficiency anemia, unspecified: Secondary | ICD-10-CM | POA: Diagnosis not present

## 2016-04-15 DIAGNOSIS — D631 Anemia in chronic kidney disease: Secondary | ICD-10-CM | POA: Diagnosis not present

## 2016-04-15 DIAGNOSIS — E1029 Type 1 diabetes mellitus with other diabetic kidney complication: Secondary | ICD-10-CM | POA: Diagnosis not present

## 2016-04-17 DIAGNOSIS — D631 Anemia in chronic kidney disease: Secondary | ICD-10-CM | POA: Diagnosis not present

## 2016-04-17 DIAGNOSIS — E1029 Type 1 diabetes mellitus with other diabetic kidney complication: Secondary | ICD-10-CM | POA: Diagnosis not present

## 2016-04-17 DIAGNOSIS — N186 End stage renal disease: Secondary | ICD-10-CM | POA: Diagnosis not present

## 2016-04-17 DIAGNOSIS — D509 Iron deficiency anemia, unspecified: Secondary | ICD-10-CM | POA: Diagnosis not present

## 2016-04-19 DIAGNOSIS — D631 Anemia in chronic kidney disease: Secondary | ICD-10-CM | POA: Diagnosis not present

## 2016-04-19 DIAGNOSIS — D509 Iron deficiency anemia, unspecified: Secondary | ICD-10-CM | POA: Diagnosis not present

## 2016-04-19 DIAGNOSIS — N186 End stage renal disease: Secondary | ICD-10-CM | POA: Diagnosis not present

## 2016-04-19 DIAGNOSIS — E1029 Type 1 diabetes mellitus with other diabetic kidney complication: Secondary | ICD-10-CM | POA: Diagnosis not present

## 2016-04-22 DIAGNOSIS — E1029 Type 1 diabetes mellitus with other diabetic kidney complication: Secondary | ICD-10-CM | POA: Diagnosis not present

## 2016-04-22 DIAGNOSIS — N186 End stage renal disease: Secondary | ICD-10-CM | POA: Diagnosis not present

## 2016-04-22 DIAGNOSIS — D631 Anemia in chronic kidney disease: Secondary | ICD-10-CM | POA: Diagnosis not present

## 2016-04-22 DIAGNOSIS — D509 Iron deficiency anemia, unspecified: Secondary | ICD-10-CM | POA: Diagnosis not present

## 2016-04-24 DIAGNOSIS — D509 Iron deficiency anemia, unspecified: Secondary | ICD-10-CM | POA: Diagnosis not present

## 2016-04-24 DIAGNOSIS — E1029 Type 1 diabetes mellitus with other diabetic kidney complication: Secondary | ICD-10-CM | POA: Diagnosis not present

## 2016-04-24 DIAGNOSIS — D631 Anemia in chronic kidney disease: Secondary | ICD-10-CM | POA: Diagnosis not present

## 2016-04-24 DIAGNOSIS — N186 End stage renal disease: Secondary | ICD-10-CM | POA: Diagnosis not present

## 2016-04-26 DIAGNOSIS — D631 Anemia in chronic kidney disease: Secondary | ICD-10-CM | POA: Diagnosis not present

## 2016-04-26 DIAGNOSIS — D509 Iron deficiency anemia, unspecified: Secondary | ICD-10-CM | POA: Diagnosis not present

## 2016-04-26 DIAGNOSIS — Z992 Dependence on renal dialysis: Secondary | ICD-10-CM | POA: Diagnosis not present

## 2016-04-26 DIAGNOSIS — T8610 Unspecified complication of kidney transplant: Secondary | ICD-10-CM | POA: Diagnosis not present

## 2016-04-26 DIAGNOSIS — N186 End stage renal disease: Secondary | ICD-10-CM | POA: Diagnosis not present

## 2016-04-26 DIAGNOSIS — E1029 Type 1 diabetes mellitus with other diabetic kidney complication: Secondary | ICD-10-CM | POA: Diagnosis not present

## 2016-04-29 DIAGNOSIS — N186 End stage renal disease: Secondary | ICD-10-CM | POA: Diagnosis not present

## 2016-04-29 DIAGNOSIS — D631 Anemia in chronic kidney disease: Secondary | ICD-10-CM | POA: Diagnosis not present

## 2016-04-29 DIAGNOSIS — E1029 Type 1 diabetes mellitus with other diabetic kidney complication: Secondary | ICD-10-CM | POA: Diagnosis not present

## 2016-04-29 DIAGNOSIS — D509 Iron deficiency anemia, unspecified: Secondary | ICD-10-CM | POA: Diagnosis not present

## 2016-05-01 DIAGNOSIS — N186 End stage renal disease: Secondary | ICD-10-CM | POA: Diagnosis not present

## 2016-05-01 DIAGNOSIS — D509 Iron deficiency anemia, unspecified: Secondary | ICD-10-CM | POA: Diagnosis not present

## 2016-05-01 DIAGNOSIS — D631 Anemia in chronic kidney disease: Secondary | ICD-10-CM | POA: Diagnosis not present

## 2016-05-01 DIAGNOSIS — E1029 Type 1 diabetes mellitus with other diabetic kidney complication: Secondary | ICD-10-CM | POA: Diagnosis not present

## 2016-05-03 DIAGNOSIS — E1029 Type 1 diabetes mellitus with other diabetic kidney complication: Secondary | ICD-10-CM | POA: Diagnosis not present

## 2016-05-03 DIAGNOSIS — N186 End stage renal disease: Secondary | ICD-10-CM | POA: Diagnosis not present

## 2016-05-03 DIAGNOSIS — D631 Anemia in chronic kidney disease: Secondary | ICD-10-CM | POA: Diagnosis not present

## 2016-05-03 DIAGNOSIS — D509 Iron deficiency anemia, unspecified: Secondary | ICD-10-CM | POA: Diagnosis not present

## 2016-05-07 DIAGNOSIS — I871 Compression of vein: Secondary | ICD-10-CM | POA: Diagnosis not present

## 2016-05-07 DIAGNOSIS — N186 End stage renal disease: Secondary | ICD-10-CM | POA: Diagnosis not present

## 2016-05-07 DIAGNOSIS — Z992 Dependence on renal dialysis: Secondary | ICD-10-CM | POA: Diagnosis not present

## 2016-05-07 DIAGNOSIS — T82858D Stenosis of vascular prosthetic devices, implants and grafts, subsequent encounter: Secondary | ICD-10-CM | POA: Diagnosis not present

## 2016-05-08 DIAGNOSIS — E1029 Type 1 diabetes mellitus with other diabetic kidney complication: Secondary | ICD-10-CM | POA: Diagnosis not present

## 2016-05-08 DIAGNOSIS — D509 Iron deficiency anemia, unspecified: Secondary | ICD-10-CM | POA: Diagnosis not present

## 2016-05-08 DIAGNOSIS — D631 Anemia in chronic kidney disease: Secondary | ICD-10-CM | POA: Diagnosis not present

## 2016-05-08 DIAGNOSIS — N186 End stage renal disease: Secondary | ICD-10-CM | POA: Diagnosis not present

## 2016-05-10 ENCOUNTER — Encounter: Payer: Self-pay | Admitting: Nephrology

## 2016-05-10 DIAGNOSIS — N186 End stage renal disease: Secondary | ICD-10-CM

## 2016-05-10 DIAGNOSIS — Z992 Dependence on renal dialysis: Principal | ICD-10-CM

## 2016-05-10 NOTE — Addendum Note (Signed)
Addended by: Loren Racer on: 05/10/2016 01:53 PM   Modules accepted: Orders

## 2016-05-10 NOTE — Progress Notes (Signed)
[facility] Brief Note:  Notified by outpatient dialysis center that Mr. Karl presented to HD with clotted RUE AVG. Stat K drawn and was 5.9. 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 Spoke to IR, plan is for tunneled dialysis catheter placement 10/15 at 9am. Pt aware to be at [facility] at 8:15, NPO after midnight. Kayexelate 30g x 2 Rx sent to pharmacy. He will be dialyzed tomorrow (10/15) after procedure.    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 70% venous anastomosis stenosis treated to 10% with 8 x 8 Vaccess angioplasty. Patent body of the graft with minimal cannulation site irregularities. Arterial anastomosis and inflow graft segments are patent. Continue routine access flow monitoring and cannulating the AVG with 15 G needles. With the central vein stenosis and its effect on the hemodynamics of this graft-I doubt that this access will stay patent for much longer and have referred him to vascular surgery for evaluation and placement of new dialysis access.

## 2016-05-11 ENCOUNTER — Other Ambulatory Visit: Payer: Self-pay | Admitting: Nephrology

## 2016-05-11 ENCOUNTER — Ambulatory Visit (HOSPITAL_COMMUNITY)
Admission: RE | Admit: 2016-05-11 | Discharge: 2016-05-11 | Disposition: A | Discharge status: Home / Self Care | Payer: Medicare Other | Source: Other Acute Inpatient Hospital | Attending: Emergency Medicine | Admitting: Emergency Medicine

## 2016-05-11 ENCOUNTER — Ambulatory Visit (HOSPITAL_COMMUNITY)
Admission: RE | Admit: 2016-05-11 | Discharge: 2016-05-11 | Disposition: A | Discharge status: Home / Self Care | Payer: Medicare Other | Source: Ambulatory Visit | Attending: Nephrology | Admitting: Nephrology

## 2016-05-11 ENCOUNTER — Encounter (HOSPITAL_COMMUNITY): Payer: Self-pay | Admitting: Interventional Radiology

## 2016-05-11 ENCOUNTER — Emergency Department (HOSPITAL_COMMUNITY)
Admission: EM | Admit: 2016-05-11 | Discharge: 2016-05-11 | Discharge status: Absent without leave | Payer: Medicare Other

## 2016-05-11 DIAGNOSIS — I120 Hypertensive chronic kidney disease with stage 5 chronic kidney disease or end stage renal disease: Secondary | ICD-10-CM | POA: Insufficient documentation

## 2016-05-11 DIAGNOSIS — N186 End stage renal disease: Secondary | ICD-10-CM | POA: Diagnosis present

## 2016-05-11 DIAGNOSIS — Y832 Surgical operation with anastomosis, bypass or graft as the cause of abnormal reaction of the patient, or of later complication, without mention of misadventure at the time of the procedure: Secondary | ICD-10-CM | POA: Insufficient documentation

## 2016-05-11 DIAGNOSIS — Z992 Dependence on renal dialysis: Secondary | ICD-10-CM | POA: Diagnosis not present

## 2016-05-11 DIAGNOSIS — Z7982 Long term (current) use of aspirin: Secondary | ICD-10-CM | POA: Insufficient documentation

## 2016-05-11 DIAGNOSIS — N2581 Secondary hyperparathyroidism of renal origin: Secondary | ICD-10-CM | POA: Insufficient documentation

## 2016-05-11 DIAGNOSIS — M109 Gout, unspecified: Secondary | ICD-10-CM | POA: Insufficient documentation

## 2016-05-11 DIAGNOSIS — Z79899 Other long term (current) drug therapy: Secondary | ICD-10-CM | POA: Insufficient documentation

## 2016-05-11 DIAGNOSIS — E1122 Type 2 diabetes mellitus with diabetic chronic kidney disease: Secondary | ICD-10-CM | POA: Insufficient documentation

## 2016-05-11 DIAGNOSIS — Z4901 Encounter for fitting and adjustment of extracorporeal dialysis catheter: Secondary | ICD-10-CM | POA: Diagnosis not present

## 2016-05-11 DIAGNOSIS — T82858A Stenosis of vascular prosthetic devices, implants and grafts, initial encounter: Secondary | ICD-10-CM | POA: Diagnosis not present

## 2016-05-11 DIAGNOSIS — T8249XA Other complication of vascular dialysis catheter, initial encounter: Secondary | ICD-10-CM | POA: Diagnosis not present

## 2016-05-11 HISTORY — PX: IR GENERIC HISTORICAL: IMG1180011

## 2016-05-11 LAB — RENAL FUNCTION PANEL
ALBUMIN: 3.3 g/dL — AB (ref 3.5–5.0)
ANION GAP: 20 — AB (ref 5–15)
BUN: 119 mg/dL — ABNORMAL HIGH (ref 6–20)
CALCIUM: 6.1 mg/dL — AB (ref 8.9–10.3)
CO2: 17 mmol/L — ABNORMAL LOW (ref 22–32)
Chloride: 102 mmol/L (ref 101–111)
Creatinine, Ser: 18.9 mg/dL — ABNORMAL HIGH (ref 0.61–1.24)
GFR, EST AFRICAN AMERICAN: 3 mL/min — AB (ref >60)
GFR, EST NON AFRICAN AMERICAN: 2 mL/min — AB (ref >60)
GLUCOSE: 68 mg/dL (ref 65–99)
PHOSPHORUS: 10.5 mg/dL — AB (ref 2.5–4.6)
POTASSIUM: 6.2 mmol/L — AB (ref 3.5–5.1)
SODIUM: 139 mmol/L (ref 135–145)

## 2016-05-11 MED ORDER — LIDOCAINE HCL 1 % IJ SOLN
INTRAMUSCULAR | Status: AC | PRN
  Administered 2016-05-11: 20 mL

## 2016-05-11 MED ORDER — MIDAZOLAM HCL 2 MG/2ML IJ SOLN
INTRAMUSCULAR | Status: AC | PRN
  Administered 2016-05-11: 0.5 mg via INTRAVENOUS
  Administered 2016-05-11 (×2): 1 mg via INTRAVENOUS
  Administered 2016-05-11: 0.5 mg via INTRAVENOUS

## 2016-05-11 MED ORDER — CEFAZOLIN SODIUM-DEXTROSE 2-4 GM/100ML-% IV SOLN: 2.0000 g | Freq: Once | INTRAVENOUS | Status: DC

## 2016-05-11 MED ORDER — FENTANYL CITRATE (PF) 100 MCG/2ML IJ SOLN
INTRAMUSCULAR | Status: AC
  Filled 2016-05-11: qty 4

## 2016-05-11 MED ORDER — CEFAZOLIN SODIUM-DEXTROSE 2-4 GM/100ML-% IV SOLN
INTRAVENOUS | Status: AC
  Filled 2016-05-11: qty 100

## 2016-05-11 MED ORDER — MIDAZOLAM HCL 2 MG/2ML IJ SOLN
INTRAMUSCULAR | Status: AC
  Filled 2016-05-11: qty 4

## 2016-05-11 MED ORDER — HEPARIN SODIUM (PORCINE) 1000 UNIT/ML IJ SOLN
INTRAMUSCULAR | Status: AC
  Administered 2016-05-11: 3800 [IU]
  Filled 2016-05-11: qty 1

## 2016-05-11 MED ORDER — FENTANYL CITRATE (PF) 100 MCG/2ML IJ SOLN
INTRAMUSCULAR | Status: AC | PRN
  Administered 2016-05-11: 50 ug via INTRAVENOUS
  Administered 2016-05-11: 25 ug via INTRAVENOUS
  Administered 2016-05-11: 50 ug via INTRAVENOUS
  Administered 2016-05-11: 25 ug via INTRAVENOUS

## 2016-05-11 MED ORDER — LIDOCAINE HCL 1 % IJ SOLN
INTRAMUSCULAR | Status: AC
  Filled 2016-05-11: qty 20

## 2016-05-11 NOTE — Sedation Documentation (Signed)
Patient denies pain and is resting comfortably.  

## 2016-05-11 NOTE — Procedures (Signed)
Interventional Radiology Procedure Note  Procedure: Placement of a left IJ approach HD tunneled catheter.  23cm tip to cuff.  Tip is positioned at the superior cavoatrial junction and catheter is ready for immediate use.   US guided right arm IV.   Complications: None Recommendations:  - Ok to use catheter - observe 30 minutes, then DC to HD at [facility] - Do not submerge  - Routine care   Signed,  Dulcy Fanny. Earleen Newport, DO

## 2016-05-11 NOTE — Procedures (Signed)
Pt seen on HD.  SP Lt IJ perm cath.  Ap 180 Vp 130  BFR 400  SBP 135.  Tolerating HD well so far.  Plan to DC home after HD.

## 2016-05-11 NOTE — Progress Notes (Signed)
Hemodialysis 3.5 hour treatment completed per order. Pt tolerated well with 4L fluid removal. VSS throughout treatment. Order to discharge post treatment. US guided IV in R upper arm removed with pressure held until hemostasis achieved. New permcath dressing intact with edges of tape peeling slightly. Dressing reinforced with Transpore tape. Patient independent with dressing. Instructed patient to keep catheter dressing dry and if dislodged, report to his outpatient dialysis center. Patient verbalized understanding and denied questions or concerns. Escorted patient to discharge area where he ambulated to his car. VS upon discharge as follows: T- 97.7 Pulse- 66 Resp-20 BP-151/87 POX-100% Standing weight- 95.3kg

## 2016-05-11 NOTE — Consult Note (Signed)
Chief Complaint: Malfunctioning dialysis access  Referring Physician(s): Stovall,Kathryn R  History of Present Illness: Patrick [name] is a 60 y.o. male with a history of renal failure, presenting urgently for evaluation to restore HD capability.    He tells me he has been receiving HD since February 2016.  Until Saturday 05/10/2016, he was completing he HD with right upper extremity circuit.  He had only a partial treatment on Thursday (schedule is T-R-Sat), and no treatment on Saturday.  VIR was then contacted for evaluation.   He tells me that the circuit was completed by "a doctor on [employer]".  He does not remember when, but probably late 2015.  He has had catheters in the past.    His K on Saturday was 5.9, and he remained asymptomatic.  Was treated medically with Kayexelate.    On review of his imaging, he does not have any images in our PACS of prior circuit study.  He has a non-contrast CT showing right chest wall collateral veins, and a distorted metallic stent in the right brachiocephalic vein.  Right IJ is likely not an option.   On physical exam the RUE circuit is pulsatile, with no thrill.  Will survey the veins with [place] before placing catheter.   Past Medical History:  Diagnosis Date  . Diabetes mellitus without complication ([place])   . End-stage renal failure with renal transplant ([place])   . Gout   . Hypertension   . Impotence of organic origin   . Secondary hyperparathyroidism ([place])   . Type I (juvenile type) diabetes mellitus with renal manifestations, not stated as uncontrolled     Past Surgical History:  Procedure Laterality Date  . APPENDECTOMY    . AV FISTULA PLACEMENT Right 09/18/2014   Procedure: INSERTION OF ARTERIOVENOUS (AV) GORE-TEX GRAFT ARM USING 4-7MM  X 45CM STRETCH GORE-TEX VASCULAR GRAFT;  Surgeon: Rosetta Posner, MD;  Location: [facility];  Service: Vascular;  Laterality: Right;  . birth defect heart surgery  1996  . KIDNEY TRANSPLANT  2009     Allergies: Iodinated diagnostic agents  Medications: Prior to Admission medications   Medication Sig Start Date End Date Taking? Authorizing Provider  amLODipine (NORVASC) 2.5 MG tablet Take 2.5 mg by mouth daily.    Historical Provider, MD  aspirin 81 MG tablet Take 81 mg by mouth daily.    Historical Provider, MD  atorvastatin (LIPITOR) 10 MG tablet Take 1 tablet (10 mg total) by mouth daily at 6 PM. 11/23/15   Theodis Blaze, MD  calcium carbonate (OS-CAL - DOSED IN MG OF ELEMENTAL CALCIUM) 1250 (500 Ca) MG tablet Take 1 tablet by mouth daily with breakfast.    Historical Provider, MD  carvedilol (COREG) 25 MG tablet Take 25 mg by mouth 2 (two) times daily with a meal.    Historical Provider, MD  colchicine 0.6 MG tablet Take 0.5 tablets (0.3 mg total) by mouth daily. Patient taking differently: Take 0.6 mg by mouth every 14 (fourteen) days.  11/23/15   Theodis Blaze, MD  ipratropium-albuterol (DUONEB) 0.5-2.5 (3) MG/3ML SOLN Take 3 mLs by nebulization every 4 (four) hours as needed. 11/23/15   Theodis Blaze, MD  isosorbide mononitrate (IMDUR) 30 MG 24 hr tablet Take 1 tablet (30 mg total) by mouth daily. 01/26/16   Allie Bossier, MD  repaglinide (PRANDIN) 0.5 MG tablet Take 1 tablet (0.5 mg total) by mouth 2 (two) times daily before a meal. 03/25/16 03/20/17  Renato Shin,  MD     Family History  Problem Relation Age of Onset  . Hyperlipidemia Mother   . Hypertension Mother     Social History   Social History  . Marital status: Married    Spouse name: N/A  . Number of children: N/A  . Years of education: N/A   Social History Main Topics  . Smoking status: Former Research scientist (life sciences)  . Smokeless tobacco: Not on file  . Alcohol use No  . Drug use: No  . Sexual activity: Not Currently    Birth control/ protection: None   Other Topics Concern  . Not on file   Social History Narrative  . No narrative on file       Review of Systems: A 12 point ROS discussed and pertinent positives  are indicated in the HPI above.  All other systems are negative.  Review of Systems  Vital Signs: BP (!) 158/87   Pulse 66   Resp 16   SpO2 100% Comment: RA  Physical Exam  Mallampati Score: 2    Imaging: No results found.  Labs:  CBC:  Recent Labs  01/22/16 0735 01/22/16 1720 01/23/16 0320 01/24/16 0815  WBC 8.6 5.7 5.3 6.2  HGB 5.5* 9.0* 9.5* 10.3*  HCT 18.1* 30.6* 31.7* 34.1*  PLT 213 190 179 179    COAGS:  Recent Labs  11/17/15 1502  INR 1.19    BMP:  Recent Labs  01/22/16 0735 01/23/16 0320 01/24/16 0815 01/25/16 0326  NA 136 137 135 136  K 5.3* 4.1 3.9 4.4  CL 98* 99* 98* 99*  CO2 22 30 27 27   GLUCOSE 84 111* 92 77  BUN 47* 21* 13 11  CALCIUM 9.3 8.0* 7.9* 7.9*  CREATININE 12.73* 7.27* 6.47* 5.46*  GFRNONAA 4* 7* 8* 10*  GFRAA 4* 8* 10* 12*    LIVER FUNCTION TESTS:  Recent Labs  10/13/15 0737 10/25/15 1704  11/22/15 0530 11/23/15 0506 01/22/16 0735 01/23/16 0320  BILITOT 0.3 0.5  --   --   --  1.0 1.1  AST 22 24  --   --   --  24 27  ALT 12* 13*  --   --   --  10* 8*  ALKPHOS 87 49  --   --   --  62 69  PROT 7.5 7.5  --   --   --  7.2 6.8  ALBUMIN 2.5* 2.7*  < > 2.5* 2.4* 3.2* 2.8*  < > = values in this interval not displayed.  TUMOR MARKERS: No results for input(s): AFPTM, CEA, CA199, CHROMGRNA in the last 8760 hours.  Assessment and Plan:  60 yo male with non-functioning RUE HD circuit.  I think that there is imaging proof of right brachiocephalic vein/stent occlusion, which is limiting the flow through the circuit.  I think attempting a declot will be futile.    We will plan on tunneled catheter placement.   He has been set up for HD after our catheter at [facility].  Thank you for this interesting consult.  I greatly enjoyed meeting Lavarius Doughten and look forward to participating in their care.  A copy of this report was sent to the requesting provider on this date.  Electronically Signed: Corrie Mckusick 05/11/2016,  9:08 AM   I spent a total of  30 Minutes   in face to face in clinical consultation, greater than 50% of which was counseling/coordinating care for renal failure, RUE dialysis circuit failure, possible HD catheter  placement.

## 2016-05-13 DIAGNOSIS — N186 End stage renal disease: Secondary | ICD-10-CM | POA: Diagnosis not present

## 2016-05-13 DIAGNOSIS — D509 Iron deficiency anemia, unspecified: Secondary | ICD-10-CM | POA: Diagnosis not present

## 2016-05-13 DIAGNOSIS — D631 Anemia in chronic kidney disease: Secondary | ICD-10-CM | POA: Diagnosis not present

## 2016-05-13 DIAGNOSIS — E1029 Type 1 diabetes mellitus with other diabetic kidney complication: Secondary | ICD-10-CM | POA: Diagnosis not present

## 2016-05-15 DIAGNOSIS — D631 Anemia in chronic kidney disease: Secondary | ICD-10-CM | POA: Diagnosis not present

## 2016-05-15 DIAGNOSIS — E1029 Type 1 diabetes mellitus with other diabetic kidney complication: Secondary | ICD-10-CM | POA: Diagnosis not present

## 2016-05-15 DIAGNOSIS — N186 End stage renal disease: Secondary | ICD-10-CM | POA: Diagnosis not present

## 2016-05-15 DIAGNOSIS — D509 Iron deficiency anemia, unspecified: Secondary | ICD-10-CM | POA: Diagnosis not present

## 2016-05-17 DIAGNOSIS — E1029 Type 1 diabetes mellitus with other diabetic kidney complication: Secondary | ICD-10-CM | POA: Diagnosis not present

## 2016-05-17 DIAGNOSIS — N186 End stage renal disease: Secondary | ICD-10-CM | POA: Diagnosis not present

## 2016-05-17 DIAGNOSIS — D631 Anemia in chronic kidney disease: Secondary | ICD-10-CM | POA: Diagnosis not present

## 2016-05-17 DIAGNOSIS — D509 Iron deficiency anemia, unspecified: Secondary | ICD-10-CM | POA: Diagnosis not present

## 2016-05-20 DIAGNOSIS — D509 Iron deficiency anemia, unspecified: Secondary | ICD-10-CM | POA: Diagnosis not present

## 2016-05-20 DIAGNOSIS — E1029 Type 1 diabetes mellitus with other diabetic kidney complication: Secondary | ICD-10-CM | POA: Diagnosis not present

## 2016-05-20 DIAGNOSIS — N186 End stage renal disease: Secondary | ICD-10-CM | POA: Diagnosis not present

## 2016-05-20 DIAGNOSIS — D631 Anemia in chronic kidney disease: Secondary | ICD-10-CM | POA: Diagnosis not present

## 2016-05-23 DIAGNOSIS — E119 Type 2 diabetes mellitus without complications: Secondary | ICD-10-CM | POA: Diagnosis not present

## 2016-05-23 DIAGNOSIS — N186 End stage renal disease: Secondary | ICD-10-CM | POA: Diagnosis not present

## 2016-05-23 DIAGNOSIS — J019 Acute sinusitis, unspecified: Secondary | ICD-10-CM | POA: Diagnosis not present

## 2016-05-23 DIAGNOSIS — Z125 Encounter for screening for malignant neoplasm of prostate: Secondary | ICD-10-CM | POA: Diagnosis not present

## 2016-05-23 DIAGNOSIS — I10 Essential (primary) hypertension: Secondary | ICD-10-CM | POA: Diagnosis not present

## 2016-05-24 DIAGNOSIS — D509 Iron deficiency anemia, unspecified: Secondary | ICD-10-CM | POA: Diagnosis not present

## 2016-05-24 DIAGNOSIS — N186 End stage renal disease: Secondary | ICD-10-CM | POA: Diagnosis not present

## 2016-05-24 DIAGNOSIS — D631 Anemia in chronic kidney disease: Secondary | ICD-10-CM | POA: Diagnosis not present

## 2016-05-24 DIAGNOSIS — E1029 Type 1 diabetes mellitus with other diabetic kidney complication: Secondary | ICD-10-CM | POA: Diagnosis not present

## 2016-05-25 ENCOUNTER — Observation Stay (HOSPITAL_COMMUNITY): Payer: Medicare Other

## 2016-05-25 ENCOUNTER — Observation Stay (HOSPITAL_COMMUNITY)
Admission: EM | Admit: 2016-05-25 | Discharge: 2016-05-26 | Disposition: A | Discharge status: Home / Self Care | Payer: Medicare Other | Attending: Internal Medicine | Admitting: Internal Medicine

## 2016-05-25 ENCOUNTER — Encounter (HOSPITAL_COMMUNITY): Payer: Self-pay | Admitting: Neurology

## 2016-05-25 ENCOUNTER — Emergency Department (HOSPITAL_COMMUNITY): Payer: Medicare Other

## 2016-05-25 DIAGNOSIS — Q245 Malformation of coronary vessels: Secondary | ICD-10-CM

## 2016-05-25 DIAGNOSIS — Z87891 Personal history of nicotine dependence: Secondary | ICD-10-CM | POA: Diagnosis not present

## 2016-05-25 DIAGNOSIS — E119 Type 2 diabetes mellitus without complications: Secondary | ICD-10-CM

## 2016-05-25 DIAGNOSIS — Z992 Dependence on renal dialysis: Secondary | ICD-10-CM | POA: Diagnosis not present

## 2016-05-25 DIAGNOSIS — R079 Chest pain, unspecified: Secondary | ICD-10-CM | POA: Diagnosis not present

## 2016-05-25 DIAGNOSIS — Z951 Presence of aortocoronary bypass graft: Secondary | ICD-10-CM | POA: Diagnosis not present

## 2016-05-25 DIAGNOSIS — T8612 Kidney transplant failure: Secondary | ICD-10-CM | POA: Diagnosis present

## 2016-05-25 DIAGNOSIS — Z940 Kidney transplant status: Secondary | ICD-10-CM | POA: Insufficient documentation

## 2016-05-25 DIAGNOSIS — N184 Chronic kidney disease, stage 4 (severe): Secondary | ICD-10-CM

## 2016-05-25 DIAGNOSIS — I5021 Acute systolic (congestive) heart failure: Secondary | ICD-10-CM | POA: Insufficient documentation

## 2016-05-25 DIAGNOSIS — R778 Other specified abnormalities of plasma proteins: Secondary | ICD-10-CM | POA: Diagnosis present

## 2016-05-25 DIAGNOSIS — I252 Old myocardial infarction: Secondary | ICD-10-CM | POA: Insufficient documentation

## 2016-05-25 DIAGNOSIS — I480 Paroxysmal atrial fibrillation: Secondary | ICD-10-CM | POA: Diagnosis not present

## 2016-05-25 DIAGNOSIS — E1022 Type 1 diabetes mellitus with diabetic chronic kidney disease: Secondary | ICD-10-CM | POA: Insufficient documentation

## 2016-05-25 DIAGNOSIS — N186 End stage renal disease: Secondary | ICD-10-CM

## 2016-05-25 DIAGNOSIS — I132 Hypertensive heart and chronic kidney disease with heart failure and with stage 5 chronic kidney disease, or end stage renal disease: Secondary | ICD-10-CM | POA: Diagnosis not present

## 2016-05-25 DIAGNOSIS — E1122 Type 2 diabetes mellitus with diabetic chronic kidney disease: Secondary | ICD-10-CM | POA: Diagnosis not present

## 2016-05-25 DIAGNOSIS — R7989 Other specified abnormal findings of blood chemistry: Secondary | ICD-10-CM | POA: Diagnosis present

## 2016-05-25 DIAGNOSIS — Z79899 Other long term (current) drug therapy: Secondary | ICD-10-CM | POA: Diagnosis not present

## 2016-05-25 DIAGNOSIS — R0789 Other chest pain: Secondary | ICD-10-CM | POA: Diagnosis not present

## 2016-05-25 DIAGNOSIS — Z7982 Long term (current) use of aspirin: Secondary | ICD-10-CM | POA: Diagnosis not present

## 2016-05-25 DIAGNOSIS — N189 Chronic kidney disease, unspecified: Secondary | ICD-10-CM

## 2016-05-25 DIAGNOSIS — R748 Abnormal levels of other serum enzymes: Secondary | ICD-10-CM | POA: Diagnosis not present

## 2016-05-25 DIAGNOSIS — D631 Anemia in chronic kidney disease: Secondary | ICD-10-CM | POA: Diagnosis present

## 2016-05-25 DIAGNOSIS — I10 Essential (primary) hypertension: Secondary | ICD-10-CM | POA: Diagnosis present

## 2016-05-25 LAB — LIPID PANEL
CHOLESTEROL: 192 mg/dL (ref 0–200)
HDL: 34 mg/dL — ABNORMAL LOW (ref >40)
LDL Cholesterol: 108 mg/dL — ABNORMAL HIGH (ref 0–99)
TRIGLYCERIDES: 249 mg/dL — AB (ref <150)
Total CHOL/HDL Ratio: 5.6 RATIO
VLDL: 50 mg/dL — ABNORMAL HIGH (ref 0–40)

## 2016-05-25 LAB — BASIC METABOLIC PANEL
Anion gap: 11 (ref 5–15)
BUN: 51 mg/dL — AB (ref 6–20)
CHLORIDE: 106 mmol/L (ref 101–111)
CO2: 20 mmol/L — AB (ref 22–32)
CREATININE: 12.95 mg/dL — AB (ref 0.61–1.24)
Calcium: 6.7 mg/dL — ABNORMAL LOW (ref 8.9–10.3)
GFR calc Af Amer: 4 mL/min — ABNORMAL LOW (ref >60)
GFR calc non Af Amer: 4 mL/min — ABNORMAL LOW (ref >60)
GLUCOSE: 74 mg/dL (ref 65–99)
Potassium: 5.2 mmol/L — ABNORMAL HIGH (ref 3.5–5.1)
SODIUM: 137 mmol/L (ref 135–145)

## 2016-05-25 LAB — TROPONIN I
TROPONIN I: 0.03 ng/mL — AB (ref <0.03)
Troponin I: 0.04 ng/mL (ref <0.03)

## 2016-05-25 LAB — D-DIMER, QUANTITATIVE: D-Dimer, Quant: 3.9 ug/mL-FEU — ABNORMAL HIGH (ref 0.00–0.50)

## 2016-05-25 LAB — CBC WITH DIFFERENTIAL/PLATELET
Basophils Absolute: 0 10*3/uL (ref 0.0–0.1)
Basophils Relative: 1 %
EOS PCT: 12 %
Eosinophils Absolute: 0.7 10*3/uL (ref 0.0–0.7)
HCT: 31.5 % — ABNORMAL LOW (ref 39.0–52.0)
Hemoglobin: 10.2 g/dL — ABNORMAL LOW (ref 13.0–17.0)
LYMPHS ABS: 1.7 10*3/uL (ref 0.7–4.0)
LYMPHS PCT: 31 %
MCH: 31.4 pg (ref 26.0–34.0)
MCHC: 32.4 g/dL (ref 30.0–36.0)
MCV: 96.9 fL (ref 78.0–100.0)
MONO ABS: 0.5 10*3/uL (ref 0.1–1.0)
MONOS PCT: 8 %
Neutro Abs: 2.7 10*3/uL (ref 1.7–7.7)
Neutrophils Relative %: 48 %
PLATELETS: 139 10*3/uL — AB (ref 150–400)
RBC: 3.25 MIL/uL — ABNORMAL LOW (ref 4.22–5.81)
RDW: 18.1 % — AB (ref 11.5–15.5)
WBC: 5.5 10*3/uL (ref 4.0–10.5)

## 2016-05-25 LAB — MRSA PCR SCREENING: MRSA BY PCR: NEGATIVE

## 2016-05-25 LAB — GLUCOSE, CAPILLARY: Glucose-Capillary: 94 mg/dL (ref 65–99)

## 2016-05-25 LAB — TSH: TSH: 5.113 u[IU]/mL — ABNORMAL HIGH (ref 0.350–4.500)

## 2016-05-25 MED ORDER — MORPHINE SULFATE (PF) 4 MG/ML IV SOLN: 2.0000 mg | INTRAVENOUS | Status: DC | PRN

## 2016-05-25 MED ORDER — TECHNETIUM TO 99M ALBUMIN AGGREGATED
4.4000 | Freq: Once | INTRAVENOUS | Status: AC | PRN
  Administered 2016-05-25: 4.4 via INTRAVENOUS

## 2016-05-25 MED ORDER — IPRATROPIUM-ALBUTEROL 0.5-2.5 (3) MG/3ML IN SOLN: 3.0000 mL | RESPIRATORY_TRACT | Status: DC | PRN

## 2016-05-25 MED ORDER — GI COCKTAIL ~~LOC~~
30.0000 mL | Freq: Once | ORAL | Status: DC
Start: 2016-05-25 — End: 2016-05-26
  Filled 2016-05-25: qty 30

## 2016-05-25 MED ORDER — METOPROLOL TARTRATE 100 MG PO TABS
100.0000 mg | ORAL_TABLET | Freq: Two times a day (BID) | ORAL | Status: DC
  Administered 2016-05-25 – 2016-05-26 (×2): 100 mg via ORAL
  Filled 2016-05-25 (×3): qty 1

## 2016-05-25 MED ORDER — ATORVASTATIN CALCIUM 10 MG PO TABS: 10.0000 mg | ORAL_TABLET | Freq: Every day | ORAL | Status: DC

## 2016-05-25 MED ORDER — GI COCKTAIL ~~LOC~~: 30.0000 mL | Freq: Four times a day (QID) | ORAL | Status: DC | PRN

## 2016-05-25 MED ORDER — AMLODIPINE BESYLATE 2.5 MG PO TABS
2.5000 mg | ORAL_TABLET | Freq: Every day | ORAL | Status: DC
  Administered 2016-05-26: 2.5 mg via ORAL
  Filled 2016-05-25: qty 1

## 2016-05-25 MED ORDER — ISOSORBIDE MONONITRATE ER 30 MG PO TB24
30.0000 mg | ORAL_TABLET | Freq: Every day | ORAL | Status: DC
  Administered 2016-05-26: 30 mg via ORAL
  Filled 2016-05-25 (×3): qty 1

## 2016-05-25 MED ORDER — COLCHICINE 0.6 MG PO TABS: 0.6000 mg | ORAL_TABLET | ORAL | Status: DC

## 2016-05-25 MED ORDER — INFLUENZA VAC SPLIT QUAD 0.5 ML IM SUSY: 0.5000 mL | PREFILLED_SYRINGE | INTRAMUSCULAR | Status: DC

## 2016-05-25 MED ORDER — CALCIUM CARBONATE 1250 (500 CA) MG PO TABS
1.0000 | ORAL_TABLET | Freq: Every day | ORAL | Status: DC
  Filled 2016-05-25: qty 1

## 2016-05-25 MED ORDER — REPAGLINIDE 0.5 MG PO TABS
0.5000 mg | ORAL_TABLET | Freq: Two times a day (BID) | ORAL | Status: DC
  Administered 2016-05-25: 0.5 mg via ORAL
  Filled 2016-05-25 (×3): qty 1

## 2016-05-25 MED ORDER — ONDANSETRON HCL 4 MG/2ML IJ SOLN: 4.0000 mg | Freq: Four times a day (QID) | INTRAMUSCULAR | Status: DC | PRN

## 2016-05-25 MED ORDER — TECHNETIUM TC 99M DIETHYLENETRIAME-PENTAACETIC ACID: 32.3000 | Freq: Once | INTRAVENOUS | Status: DC | PRN

## 2016-05-25 MED ORDER — AMLODIPINE BESYLATE 2.5 MG PO TABS: 2.5000 mg | ORAL_TABLET | Freq: Every day | ORAL | Status: DC

## 2016-05-25 MED ORDER — ASPIRIN 325 MG PO TABS
325.0000 mg | ORAL_TABLET | Freq: Every day | ORAL | Status: DC
  Administered 2016-05-26: 325 mg via ORAL
  Filled 2016-05-25: qty 1

## 2016-05-25 MED ORDER — CARVEDILOL 12.5 MG PO TABS
25.0000 mg | ORAL_TABLET | Freq: Two times a day (BID) | ORAL | Status: DC
Start: 2016-05-25 — End: 2016-05-25

## 2016-05-25 MED ORDER — HEPARIN SODIUM (PORCINE) 5000 UNIT/ML IJ SOLN
5000.0000 [IU] | Freq: Three times a day (TID) | INTRAMUSCULAR | Status: DC
  Administered 2016-05-25 – 2016-05-26 (×2): 5000 [IU] via SUBCUTANEOUS
  Filled 2016-05-25 (×2): qty 1

## 2016-05-25 MED ORDER — ACETAMINOPHEN 325 MG PO TABS: 650.0000 mg | ORAL_TABLET | ORAL | Status: DC | PRN

## 2016-05-25 NOTE — ED Notes (Signed)
[place] given to Pt at RN's request.

## 2016-05-25 NOTE — ED Notes (Signed)
Pt transport for VQ scan.

## 2016-05-25 NOTE — H&P (Signed)
History and Physical    Patrick [name] NFA:[id number] DOB: 1956-01-24 DOA: 05/25/2016  PCP: Nolene Ebbs Patient coming from:   Chief Complaint: home  HPI: Patrick [name] is a very pleasant 60 y.o. male with medical history significant for hypertension, diabetes, end-stage renal disease on dialysis, nephrectomy after failed transplant, CAD status post CABG, gout, hyperparathyroidism presents to the emergency Department chief complaint chest pain. Initial evaluation turning for A. fib with RVR and ACS.  Information is obtained from the patient. He states he awakened with left anterior chest pain. He describes it as an ache states that radiated down his left arm. He denies palpitations diaphoresis shortness of breath nausea or vomiting. He states lying down made the pain better walking around sitting up made the pain worse. The pain was intermittent but persistent so EMS was called. Reportedly they found the patient to be in A. fib with RVR with a rate of 150. He was given 10 mg of Cardizem he reports his pain has gone away. Denies lower extremity edema cough headache dizziness syncope or near-syncope. He denies diarrhea constipation melena bright red blood per rectum. He reports he no longer makes urine. He states he had his new patient visit with new PCP 2 days ago was diagnosed with a sinus infection and started antibiotics. He denies any fever chills.  ED Course: In the emergency department he's afebrile hemodynamically stable and not hypoxic. He is provided with 324 mg of aspirin  Review of Systems: As per HPI otherwise 10 point review of systems negative.   Ambulatory Status: He ambulates independently with a steady gait. He is independent with ADLs  Past Medical History:  Diagnosis Date  . Diabetes mellitus without complication ([place])   . End-stage renal failure with renal transplant ([place])   . Gout   . Hypertension   . Impotence of organic origin   . Secondary hyperparathyroidism ([place])    . Type I (juvenile type) diabetes mellitus with renal manifestations, not stated as uncontrolled(250.41)     Past Surgical History:  Procedure Laterality Date  . APPENDECTOMY    . AV FISTULA PLACEMENT Right 09/18/2014   Procedure: INSERTION OF ARTERIOVENOUS (AV) GORE-TEX GRAFT ARM USING 4-7MM  X 45CM STRETCH GORE-TEX VASCULAR GRAFT;  Surgeon: Rosetta Posner, MD;  Location: [facility];  Service: Vascular;  Laterality: Right;  . birth defect heart surgery  1996  . IR GENERIC HISTORICAL  05/11/2016   IR FLUORO GUIDE CV LINE LEFT 05/11/2016 Corrie Mckusick, DO [facility]  . IR GENERIC HISTORICAL  05/11/2016   IR US GUIDE VASC ACCESS LEFT 05/11/2016 Corrie Mckusick, DO [facility]  . IR GENERIC HISTORICAL  05/11/2016   IR US GUIDE VASC ACCESS RIGHT 05/11/2016 Corrie Mckusick, DO [facility]  . IR GENERIC HISTORICAL  05/11/2016   IR RADIOLOGY PERIPHERAL GUIDED IV START 05/11/2016 Corrie Mckusick, DO [facility]  . KIDNEY TRANSPLANT  2009    Social History   Social History  . Marital status: Married    Spouse name: N/A  . Number of children: N/A  . Years of education: N/A   Occupational History  . Not on file.   Social History Main Topics  . Smoking status: Former Research scientist (life sciences)  . Smokeless tobacco: Not on file  . Alcohol use No  . Drug use: No  . Sexual activity: Not Currently    Birth control/ protection: None   Other Topics Concern  . Not on file   Social History Narrative  . No narrative  on file    Allergies  Allergen Reactions  . Iodinated Diagnostic Agents Rash    Other Reaction: burning to mouth, swelling of lips.    Family History  Problem Relation Age of Onset  . Hyperlipidemia Mother   . Hypertension Mother     Prior to Admission medications   Medication Sig Start Date End Date Taking? Authorizing Provider  amLODipine (NORVASC) 2.5 MG tablet Take 2.5 mg by mouth daily.   Yes Historical Provider, MD  aspirin 81 MG tablet Take 81 mg by mouth daily.   Yes Historical  Provider, MD  calcium carbonate (OS-CAL - DOSED IN MG OF ELEMENTAL CALCIUM) 1250 (500 Ca) MG tablet Take 1 tablet by mouth daily with breakfast.   Yes Historical Provider, MD  carvedilol (COREG) 25 MG tablet Take 25 mg by mouth 2 (two) times daily with a meal.   Yes Historical Provider, MD  colchicine 0.6 MG tablet Take 0.5 tablets (0.3 mg total) by mouth daily. Patient taking differently: Take 0.6 mg by mouth every 14 (fourteen) days.  11/23/15  Yes Theodis Blaze, MD  ipratropium-albuterol (DUONEB) 0.5-2.5 (3) MG/3ML SOLN Take 3 mLs by nebulization every 4 (four) hours as needed. 11/23/15  Yes Theodis Blaze, MD  isosorbide mononitrate (IMDUR) 30 MG 24 hr tablet Take 1 tablet (30 mg total) by mouth daily. 01/26/16  Yes Allie Bossier, MD  repaglinide (PRANDIN) 0.5 MG tablet Take 1 tablet (0.5 mg total) by mouth 2 (two) times daily before a meal. Patient taking differently: Take 0.5 mg by mouth 2 (two) times daily.  03/25/16 03/20/17 Yes Renato Shin, MD  atorvastatin (LIPITOR) 10 MG tablet Take 1 tablet (10 mg total) by mouth daily at 6 PM. Patient not taking: Reported on 05/25/2016 11/23/15   Theodis Blaze, MD    Physical Exam: Vitals:   05/25/16 1330 05/25/16 1415 05/25/16 1430 05/25/16 1536  BP: 110/85 115/94 113/95 112/88  Pulse: 96 97 98 89  Resp: 25 16 13 18   Temp:      TempSrc:      SpO2: 100% 100% 99% 99%     General:  Appears calm and comfortable No acute distress Eyes:  PERRL, EOMI, normal lids, iris ENT:  grossly normal hearing, lips & tongue, mucous membranes of his mouth are moist and pink Neck:  no LAD, masses or thyromegaly Cardiovascular:  RRR, no m/r/g. No LE edema. Pedal pulses present and palpable Respiratory:  CTA bilaterally, no w/r/r. Normal respiratory effort. Abdomen: Obese, soft, ntnd, positive bowel sounds, no guarding or rebounding Skin:  no rash or induration seen on limited exam Musculoskeletal:  grossly normal tone BUE/BLE, good ROM, no bony  abnormality Psychiatric:  grossly normal mood and affect, speech fluent and appropriate, AOx3 Neurologic:  CN 2-12 grossly intact, moves all extremities in coordinated fashion, sensation intact  Labs on Admission: I have personally reviewed following labs and imaging studies  CBC:  Recent Labs Lab 05/25/16 1328  WBC 5.5  NEUTROABS 2.7  HGB 10.2*  HCT 31.5*  MCV 96.9  PLT 778*   Basic Metabolic Panel:  Recent Labs Lab 05/25/16 1328  NA 137  K 5.2*  CL 106  CO2 20*  GLUCOSE 74  BUN 51*  CREATININE 12.95*  CALCIUM 6.7*   GFR: Estimated Creatinine Clearance: 6.5 mL/min (by C-G formula based on SCr of 12.95 mg/dL (H)). Liver Function Tests: No results for input(s): AST, ALT, ALKPHOS, BILITOT, PROT, ALBUMIN in the last 168 hours. No results for  input(s): LIPASE, AMYLASE in the last 168 hours. No results for input(s): AMMONIA in the last 168 hours. Coagulation Profile: No results for input(s): INR, PROTIME in the last 168 hours. Cardiac Enzymes:  Recent Labs Lab 05/25/16 1328  TROPONINI 0.03*   BNP (last 3 results) No results for input(s): PROBNP in the last 8760 hours. HbA1C: No results for input(s): HGBA1C in the last 72 hours. CBG: No results for input(s): GLUCAP in the last 168 hours. Lipid Profile: No results for input(s): CHOL, HDL, LDLCALC, TRIG, CHOLHDL, LDLDIRECT in the last 72 hours. Thyroid Function Tests:  Recent Labs  05/25/16 1328  TSH 5.113*   Anemia Panel: No results for input(s): VITAMINB12, FOLATE, FERRITIN, TIBC, IRON, RETICCTPCT in the last 72 hours. Urine analysis:    Component Value Date/Time   COLORURINE RED (A) 11/17/2015 0910   APPEARANCEUR TURBID (A) 11/17/2015 0910   LABSPEC 1.035 (H) 11/17/2015 0910   PHURINE 6.0 11/17/2015 0910   GLUCOSEU 100 (A) 11/17/2015 0910   HGBUR LARGE (A) 11/17/2015 0910   BILIRUBINUR LARGE (A) 11/17/2015 0910   KETONESUR 40 (A) 11/17/2015 0910   PROTEINUR >300 (A) 11/17/2015 0910   UROBILINOGEN  0.2 11/24/2013 0242   NITRITE POSITIVE (A) 11/17/2015 0910   LEUKOCYTESUR LARGE (A) 11/17/2015 0910    Creatinine Clearance: Estimated Creatinine Clearance: 6.5 mL/min (by C-G formula based on SCr of 12.95 mg/dL (H)).  Sepsis Labs: @LABRCNTIP (procalcitonin:4,lacticidven:4) )No results found for this or any previous visit (from the past 240 hour(s)).   Radiological Exams on Admission: Dg Chest 2 View  Result Date: 05/25/2016 CLINICAL DATA:  Chest pain EXAM: CHEST  2 VIEW COMPARISON:  01/25/2016 FINDINGS: Lungs are essentially clear. No focal consolidation. No pleural effusion or pneumothorax. Cardiomegaly. Postsurgical changes related to prior CABG. Left subclavian dual lumen dialysis catheter terminating at the cavoatrial junction. Right superior mediastinal vascular stent. Visualized osseous structures are within normal limits. Median sternotomy. IMPRESSION: No evidence of acute cardiopulmonary disease. Electronically Signed   By: Julian Hy M.D.   On: 05/25/2016 14:27    EKG: Independently reviewed. Sinus or ectopic atrial rhythm Repol abnrm suggests ischemia, lateral leads changes noted compared to June 2017  Assessment/Plan Principal Problem:   Chest pain Active Problems:   Essential hypertension   Diabetes mellitus type 2, controlled ([place])   Anemia in chronic kidney disease   Kidney transplant failure   Anomalous origin of left coronary artery   Elevated troponin   End-stage renal disease on hemodialysis ([facility])   PAF (paroxysmal atrial fibrillation) ([place])   #1. Chest pain at rest. Some typical and atypical features. Heart score 5. History of non-STEMI June 2016 related to demand ischemia from severe acute anemia. Resolved after cardizem. EKG was nonspecific changes, initial troponin mildly elevated in this dialysis patient chest x-ray without cardiopulmonary process, d-dimer elevated, VQ scan pending. He is not hypoxic chest pain resolved at time of admission. Of note  chart review indicates patient underwent Lexis scan Myoview 2016 demonstrated very small sized moderate ischemia left ventricle systolic function preserved note indicates abnormal stress test however low risk due to size of defect -Cycle troponin -Serial EKG -Follow VQ scan -Nitroglycerin and morphine as needed -GI cocktail -Lipid panel -Continue aspirin and statin -Follow TSH -ED physician spoke with patient's primary cardiology who recommended discontinuing Coreg and starting metoprolol 100 mg twice a day and he will see tomorrow -Nothing by mouth past midnight  #2. A. Fib with RVR. New-onset. Resolved after 10 mg of Cardizem. Etiology uncertain.  May be result of #1 or caused #1. -Follow TSH -Change beta blocker per cardiology recommendation -Monitor on telemetry -See #1  #3. Elevated troponin. Initial troponin mildly elevated in this dialysis patient. -Cycle troponin -See above  #4. History renal transplant and rejection. Currently a Tuesday Thursday Saturday dialysis patient. Anticipated in dialysis yesterday for full session. -Hopefully will be discharged tomorrow and can maintain dialysis schedule  #5. Diabetes. Serum glucose 74. Diet controlled. -Monitor  #6. Hypertension. Controlled -Continue home meds  #7. 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 Anemia of chronic disease. Hemoglobin 10.2. Chart review indicates this is close to baseline. Signs symptoms bleeding -Monitor  #9. Hyperkalemia. Mild. Potassium 5.2. -Monitor  DVT prophylaxis: heparin Code Status: full  Family Communication: none present  Disposition Plan: home  Consults called: ganji tomorrow  Admission status: obs    Dyanne Carrel M MD [facility]  If 7PM-7AM, please contact  night-coverage [email or web address] Password [email or web address]  05/25/2016, 4:00 PM

## 2016-05-25 NOTE — ED Notes (Signed)
Pt going for VQ scan per Shanon Brow, then he will take him to his room 3W17.

## 2016-05-25 NOTE — ED Provider Notes (Signed)
[facility] DEPT Provider Note   CSN: [id number] Arrival date & time: 05/25/16  1219     History   Chief Complaint Chief Complaint  Patient presents with  . Chest Pain  . Tachycardia    HPI Patrick [name] is a 60 y.o. male.  HPI  60 year old male with a history of diabetes, end-stage renal disease status post renal transplant, and prior history of CABG presents with chest pain. He states after he woke up at around 10 AM he started having chest pain. Was getting worse whenever he was walking around. No associated diaphoresis, shortness of breath, or vomiting. He did not seem to go away so he called EMS. They found the patient to be in A. fib with RVR with a rate of around 150 bpm. Asian denies a prior history of A. fib. After he was given 10 mg of Cardizem he states his pain has gone away. He was also given 324 mg aspirin. Now is symptom free. He felt like his heart was beating hard but denies palpitations or skipping of beats. No leg swelling. No history of DVT/PE.  Past Medical History:  Diagnosis Date  . Diabetes mellitus without complication ([place])   . End-stage renal failure with renal transplant ([place])   . Gout   . Hypertension   . Impotence of organic origin   . Secondary hyperparathyroidism ([place])   . Type I (juvenile type) diabetes mellitus with renal manifestations, not stated as uncontrolled(250.41)     Patient Active Problem List   Diagnosis Date Noted  . PAF (paroxysmal atrial fibrillation) ([place]) 05/25/2016  . ESRD (end stage renal disease) ([place]) 05/11/2016  . Acute systolic CHF (congestive heart failure) ([place])   . Anemia of chronic disease   . End-stage renal disease on hemodialysis ([place])   . Renovascular hypertension   . NSTEMI (non-ST elevated myocardial infarction) ([place])   . Acute respiratory failure with hypoxia ([place])   . Controlled diabetes mellitus type 2 with complications ([place])   . Pleural effusion   . Symptomatic anemia 01/22/2016  . Acute  respiratory failure ([place]) 01/22/2016  . Acute diastolic (congestive) heart failure 01/22/2016  . Elevated troponin 01/22/2016  . Febrile illness 01/22/2016  . Chest pain 11/17/2015  . Chest pain at rest 11/17/2015  . Normocytic anemia 11/17/2015  . Gynecomastia 11/17/2015  . Renal stone 11/17/2015  . Cough 11/17/2015  . Dyspnea 10/30/2015  . CAP (community acquired pneumonia)   . HCAP (healthcare-associated pneumonia) 10/11/2015  . Acute respiratory disease 10/11/2015  . Protein-calorie malnutrition, severe ([place]) 09/16/2014  . 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 Metabolic acidosis, NAG, failure of bicarbonate regeneration 09/14/2014  . Anemia in chronic kidney disease 11/24/2013  . 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 Hypocalcemia 11/24/2013  . Low bicarbonate 11/24/2013  . Diabetes mellitus type 2, controlled ([place]) 10/20/2013  . Essential hypertension 03/19/2013  . Acute gout 03/19/2013  . Impotence of organic origin 03/19/2013  . Secondary hyperparathyroidism ([place]) 03/19/2013  . End-stage renal failure with renal transplant ([place]) 03/19/2013  . 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 APPENDECTOMY    . AV FISTULA PLACEMENT Right 09/18/2014   Procedure: INSERTION OF ARTERIOVENOUS (AV) GORE-TEX GRAFT ARM USING 4-7MM  X 45CM STRETCH GORE-TEX VASCULAR GRAFT;  Surgeon: Rosetta Posner, MD;  Location: [facility];  Service: Vascular;  Laterality: Right;  . birth defect heart surgery  1996  .  IR GENERIC HISTORICAL  05/11/2016   IR FLUORO GUIDE CV LINE LEFT 05/11/2016 Corrie Mckusick, DO [facility]  . IR GENERIC HISTORICAL  05/11/2016   IR US GUIDE VASC ACCESS LEFT 05/11/2016 Corrie Mckusick, DO [facility]  . IR GENERIC HISTORICAL  05/11/2016   IR US GUIDE VASC ACCESS RIGHT 05/11/2016 Corrie Mckusick, DO [facility]  . IR GENERIC HISTORICAL  05/11/2016   IR RADIOLOGY PERIPHERAL GUIDED IV  START 05/11/2016 Corrie Mckusick, DO [facility]  . KIDNEY TRANSPLANT  2009       Home Medications    Prior to Admission medications   Medication Sig Start Date End Date Taking? Authorizing Provider  amLODipine (NORVASC) 2.5 MG tablet Take 2.5 mg by mouth daily.   Yes Historical Provider, MD  aspirin 81 MG tablet Take 81 mg by mouth daily.   Yes Historical Provider, MD  calcium carbonate (OS-CAL - DOSED IN MG OF ELEMENTAL CALCIUM) 1250 (500 Ca) MG tablet Take 1 tablet by mouth daily with breakfast.   Yes Historical Provider, MD  carvedilol (COREG) 25 MG tablet Take 25 mg by mouth 2 (two) times daily with a meal.   Yes Historical Provider, MD  colchicine 0.6 MG tablet Take 0.5 tablets (0.3 mg total) by mouth daily. Patient taking differently: Take 0.6 mg by mouth every 14 (fourteen) days.  11/23/15  Yes Theodis Blaze, MD  ipratropium-albuterol (DUONEB) 0.5-2.5 (3) MG/3ML SOLN Take 3 mLs by nebulization every 4 (four) hours as needed. 11/23/15  Yes Theodis Blaze, MD  isosorbide mononitrate (IMDUR) 30 MG 24 hr tablet Take 1 tablet (30 mg total) by mouth daily. 01/26/16  Yes Allie Bossier, MD  repaglinide (PRANDIN) 0.5 MG tablet Take 1 tablet (0.5 mg total) by mouth 2 (two) times daily before a meal. Patient taking differently: Take 0.5 mg by mouth 2 (two) times daily.  03/25/16 03/20/17 Yes Renato Shin, MD  atorvastatin (LIPITOR) 10 MG tablet Take 1 tablet (10 mg total) by mouth daily at 6 PM. Patient not taking: Reported on 05/25/2016 11/23/15   Theodis Blaze, MD    Family History Family History  Problem Relation Age of Onset  . Hyperlipidemia Mother   . Hypertension Mother     Social History Social History  Substance Use Topics  . Smoking status: Former Research scientist (life sciences)  . Smokeless tobacco: Not on file  . Alcohol use No     Allergies   Iodinated diagnostic agents   Review of Systems Review of Systems  Constitutional: Negative for diaphoresis.  Respiratory: Negative for shortness of  breath.   Cardiovascular: Positive for chest pain. Negative for palpitations and leg swelling.  Gastrointestinal: Negative for abdominal pain, nausea and vomiting.  All other systems reviewed and are negative.    Physical Exam Updated Vital Signs BP 134/89 (BP Location: Left Arm)   Pulse (!) 101   Temp 98.7 F (37.1 C) (Oral)   Resp 18   Ht 5\' 6"  (1.676 m)   Wt 217 lb 9.5 oz (98.7 kg)   SpO2 99%   BMI 35.12 kg/m   Physical Exam  Constitutional: He is oriented to person, place, and time. He appears well-developed and well-nourished.  Obese, resting comfortably  HENT:  Head: Normocephalic and atraumatic.  Right Ear: External ear normal.  Left Ear: External ear normal.  Nose: Nose normal.  Eyes: Right eye exhibits no discharge. Left eye exhibits no discharge.  Neck: Neck supple.  Cardiovascular: Normal rate, regular rhythm and normal heart sounds.  No murmur heard. Pulmonary/Chest: Effort normal and breath sounds normal. He exhibits no tenderness.  Abdominal: Soft. He exhibits no distension. There is no tenderness.  Musculoskeletal: He exhibits no edema.  Neurological: He is alert and oriented to person, place, and time.  Skin: Skin is warm and dry.  Nursing note and vitals reviewed.    ED Treatments / Results  Labs (all labs ordered are listed, but only abnormal results are displayed) Labs Reviewed  BASIC METABOLIC PANEL - Abnormal; Notable for the following:       Result Value   Potassium 5.2 (*)    CO2 20 (*)    BUN 51 (*)    Creatinine, Ser 12.95 (*)    Calcium 6.7 (*)    GFR calc non Af Amer 4 (*)    GFR calc Af Amer 4 (*)    All other components within normal limits  TROPONIN I - Abnormal; Notable for the following:    Troponin I 0.03 (*)    All other components within normal limits  CBC WITH DIFFERENTIAL/PLATELET - Abnormal; Notable for the following:    RBC 3.25 (*)    Hemoglobin 10.2 (*)    HCT 31.5 (*)    RDW 18.1 (*)    Platelets 139 (*)    All  other components within normal limits  D-DIMER, QUANTITATIVE (NOT AT [facility]) - Abnormal; Notable for the following:    D-Dimer, Quant 3.90 (*)    All other components within normal limits  TSH - Abnormal; Notable for the following:    TSH 5.113 (*)    All other components within normal limits  LIPID PANEL - Abnormal; Notable for the following:    Triglycerides 249 (*)    HDL 34 (*)    VLDL 50 (*)    LDL Cholesterol 108 (*)    All other components within normal limits  CREATININE, SERUM  CBC  TROPONIN I  BASIC METABOLIC PANEL    EKG  EKG Interpretation  Date/Time:  Sunday May 25 2016 12:30:59 EDT Ventricular Rate:  98 PR Interval:    QRS Duration: 80 QT Interval:  351 QTC Calculation: 449 R Axis:   67 Text Interpretation:  Sinus or ectopic atrial rhythm Repol abnrm suggests ischemia, lateral leads changes noted compared to June 2017 Confirmed by Regenia Skeeter MD, [place] [id number]) on 05/25/2016 12:40:24 PM       Radiology Dg Chest 2 View  Result Date: 05/25/2016 CLINICAL DATA:  Chest pain EXAM: CHEST  2 VIEW COMPARISON:  01/25/2016 FINDINGS: Lungs are essentially clear. No focal consolidation. No pleural effusion or pneumothorax. Cardiomegaly. Postsurgical changes related to prior CABG. Left subclavian dual lumen dialysis catheter terminating at the cavoatrial junction. Right superior mediastinal vascular stent. Visualized osseous structures are within normal limits. Median sternotomy. IMPRESSION: No evidence of acute cardiopulmonary disease. Electronically Signed   By: Julian Hy M.D.   On: 05/25/2016 14:27    Procedures Procedures (including critical care time)  Medications Ordered in ED Medications  repaglinide (PRANDIN) tablet 0.5 mg (not administered)  isosorbide mononitrate (IMDUR) 24 hr tablet 30 mg (not administered)  colchicine tablet 0.6 mg (not administered)  ipratropium-albuterol (DUONEB) 0.5-2.5 (3) MG/3ML nebulizer solution 3 mL (not administered)    calcium carbonate (OS-CAL - dosed in mg of elemental calcium) tablet 500 mg of elemental calcium (not administered)  acetaminophen (TYLENOL) tablet 650 mg (not administered)  ondansetron (ZOFRAN) injection 4 mg (not administered)  heparin injection 5,000 Units (not administered)  morphine 4 MG/ML  injection 2 mg (not administered)  gi cocktail (Maalox,Lidocaine,Donnatal) (not administered)  gi cocktail (Maalox,Lidocaine,Donnatal) (not administered)  aspirin tablet 325 mg (not administered)  metoprolol tartrate (LOPRESSOR) tablet 100 mg (not administered)  technetium TC 44M diethylenetriame-pentaacetic acid (DTPA) injection 29.7 millicurie (not administered)  amLODipine (NORVASC) tablet 2.5 mg (not administered)  atorvastatin (LIPITOR) tablet 10 mg (not administered)  technetium albumin aggregated (MAA) injection solution 4.4 millicurie (4.4 millicuries Intravenous Contrast Given 05/25/16 1635)     Initial Impression / Assessment and Plan / ED Course  I have reviewed the triage vital signs and the nursing notes.  Pertinent labs & imaging results that were available during my care of the patient were reviewed by me and considered in my medical decision making (see chart for details).  Clinical Course  Comment By Time  Will do eval for Afib and ACS. Includes Ddimer, troponin, CXR, TSH. No pain currently Sherwood Gambler, MD 10/29 1259  No further CP. Ddimer elevated, will order VQ. Intermittent brief tachycardias in the 140s for a few seconds, currently NSR. Will need admission Sherwood Gambler, MD 10/29 1435  Spoke with karen black, she will admit. Asks for cards consult (his is Dr. Einar Gip). Sherwood Gambler, MD 10/29 1450    Dr. Einar Gip recommends switching from carvedilol to 100 mg Metoprolol BID for new afib (not currently in it but paroxysmal). Will admit for ACS r/o given his multiple risk factors and chest pain in setting of tachycardia. VQ scan given elevated ddimer  Final Clinical  Impressions(s) / ED Diagnoses   Final diagnoses:  Nonspecific chest pain    New Prescriptions Current Discharge Medication List       Sherwood Gambler, MD 05/25/16 [phone]

## 2016-05-25 NOTE — ED Triage Notes (Signed)
Per EMS: pt coming from home with c/o chest pressure starting around 1000 this morning. Pt went to the bathroom when pressure started. Upon EMS arrival pt was in A-fib 140-160, pt given 10 mg Cardizem, pt now with regular rate at 90. Pt denies chest pressure at this time. Pt is a dialysis, pt goes Tues, thurs, sat. Pt went Saturday and had full treatment.

## 2016-05-25 NOTE — ED Notes (Signed)
Nurse drawing labs. 

## 2016-05-26 DIAGNOSIS — Z951 Presence of aortocoronary bypass graft: Secondary | ICD-10-CM | POA: Diagnosis not present

## 2016-05-26 DIAGNOSIS — N186 End stage renal disease: Secondary | ICD-10-CM | POA: Diagnosis not present

## 2016-05-26 DIAGNOSIS — I480 Paroxysmal atrial fibrillation: Secondary | ICD-10-CM

## 2016-05-26 DIAGNOSIS — R748 Abnormal levels of other serum enzymes: Secondary | ICD-10-CM

## 2016-05-26 DIAGNOSIS — Z992 Dependence on renal dialysis: Secondary | ICD-10-CM

## 2016-05-26 DIAGNOSIS — E1122 Type 2 diabetes mellitus with diabetic chronic kidney disease: Secondary | ICD-10-CM | POA: Diagnosis not present

## 2016-05-26 DIAGNOSIS — R0789 Other chest pain: Secondary | ICD-10-CM | POA: Diagnosis not present

## 2016-05-26 DIAGNOSIS — I252 Old myocardial infarction: Secondary | ICD-10-CM | POA: Diagnosis not present

## 2016-05-26 DIAGNOSIS — D631 Anemia in chronic kidney disease: Secondary | ICD-10-CM

## 2016-05-26 DIAGNOSIS — I483 Typical atrial flutter: Secondary | ICD-10-CM

## 2016-05-26 DIAGNOSIS — Z940 Kidney transplant status: Secondary | ICD-10-CM | POA: Diagnosis not present

## 2016-05-26 DIAGNOSIS — I132 Hypertensive heart and chronic kidney disease with heart failure and with stage 5 chronic kidney disease, or end stage renal disease: Secondary | ICD-10-CM | POA: Diagnosis not present

## 2016-05-26 DIAGNOSIS — Z87891 Personal history of nicotine dependence: Secondary | ICD-10-CM | POA: Diagnosis not present

## 2016-05-26 DIAGNOSIS — R079 Chest pain, unspecified: Secondary | ICD-10-CM

## 2016-05-26 DIAGNOSIS — Z79899 Other long term (current) drug therapy: Secondary | ICD-10-CM | POA: Diagnosis not present

## 2016-05-26 DIAGNOSIS — I10 Essential (primary) hypertension: Secondary | ICD-10-CM | POA: Diagnosis not present

## 2016-05-26 DIAGNOSIS — Z7982 Long term (current) use of aspirin: Secondary | ICD-10-CM | POA: Diagnosis not present

## 2016-05-26 DIAGNOSIS — E1022 Type 1 diabetes mellitus with diabetic chronic kidney disease: Secondary | ICD-10-CM | POA: Diagnosis not present

## 2016-05-26 DIAGNOSIS — I5021 Acute systolic (congestive) heart failure: Secondary | ICD-10-CM | POA: Diagnosis not present

## 2016-05-26 DIAGNOSIS — N184 Chronic kidney disease, stage 4 (severe): Secondary | ICD-10-CM

## 2016-05-26 LAB — BASIC METABOLIC PANEL
ANION GAP: 13 (ref 5–15)
BUN: 65 mg/dL — ABNORMAL HIGH (ref 6–20)
CALCIUM: 6.4 mg/dL — AB (ref 8.9–10.3)
CO2: 19 mmol/L — ABNORMAL LOW (ref 22–32)
Chloride: 104 mmol/L (ref 101–111)
Creatinine, Ser: 14.58 mg/dL — ABNORMAL HIGH (ref 0.61–1.24)
GFR, EST AFRICAN AMERICAN: 4 mL/min — AB (ref >60)
GFR, EST NON AFRICAN AMERICAN: 3 mL/min — AB (ref >60)
GLUCOSE: 117 mg/dL — AB (ref 65–99)
Potassium: 5.6 mmol/L — ABNORMAL HIGH (ref 3.5–5.1)
Sodium: 136 mmol/L (ref 135–145)

## 2016-05-26 LAB — T4, FREE: Free T4: 0.63 ng/dL (ref 0.61–1.12)

## 2016-05-26 LAB — CBC
HCT: 29.9 % — ABNORMAL LOW (ref 39.0–52.0)
Hemoglobin: 9.7 g/dL — ABNORMAL LOW (ref 13.0–17.0)
MCH: 31 pg (ref 26.0–34.0)
MCHC: 32.4 g/dL (ref 30.0–36.0)
MCV: 95.5 fL (ref 78.0–100.0)
PLATELETS: 123 10*3/uL — AB (ref 150–400)
RBC: 3.13 MIL/uL — ABNORMAL LOW (ref 4.22–5.81)
RDW: 18 % — AB (ref 11.5–15.5)
WBC: 5.7 10*3/uL (ref 4.0–10.5)

## 2016-05-26 LAB — GLUCOSE, CAPILLARY
GLUCOSE-CAPILLARY: 63 mg/dL — AB (ref 65–99)
GLUCOSE-CAPILLARY: 88 mg/dL (ref 65–99)
GLUCOSE-CAPILLARY: 66 mg/dL (ref 65–99)
GLUCOSE-CAPILLARY: 104 mg/dL — AB (ref 65–99)
GLUCOSE-CAPILLARY: 67 mg/dL (ref 65–99)
Glucose-Capillary: 51 mg/dL — ABNORMAL LOW (ref 65–99)
Glucose-Capillary: 70 mg/dL (ref 65–99)

## 2016-05-26 MED ORDER — WARFARIN - PHARMACIST DOSING INPATIENT: Freq: Every day | Status: DC

## 2016-05-26 MED ORDER — HEPARIN (PORCINE) IN NACL 100-0.45 UNIT/ML-% IJ SOLN
1250.0000 [IU]/h | INTRAMUSCULAR | Status: DC
  Administered 2016-05-26: 1250 [IU]/h via INTRAVENOUS
  Filled 2016-05-26: qty 250

## 2016-05-26 MED ORDER — WARFARIN SODIUM 7.5 MG PO TABS: 7.5000 mg | ORAL_TABLET | Freq: Every day | ORAL | 0 refills | Status: DC

## 2016-05-26 MED ORDER — DEXTROSE 50 % IV SOLN
INTRAVENOUS | Status: AC
  Administered 2016-05-26: 25 mL
  Filled 2016-05-26: qty 50

## 2016-05-26 MED ORDER — COLCHICINE 0.6 MG PO TABS: 0.6000 mg | ORAL_TABLET | ORAL | Status: DC

## 2016-05-26 MED ORDER — WARFARIN VIDEO: Freq: Once | Status: DC

## 2016-05-26 MED ORDER — METOPROLOL TARTRATE 100 MG PO TABS: 100.0000 mg | ORAL_TABLET | Freq: Two times a day (BID) | ORAL | 0 refills | Status: DC

## 2016-05-26 MED ORDER — ASPIRIN 81 MG PO CHEW
81.0000 mg | CHEWABLE_TABLET | Freq: Every day | ORAL | Status: DC
  Administered 2016-05-26: 81 mg via ORAL
  Filled 2016-05-26: qty 1

## 2016-05-26 MED ORDER — HEPARIN BOLUS VIA INFUSION
4000.0000 [IU] | Freq: Once | INTRAVENOUS | Status: AC
  Administered 2016-05-26: 4000 [IU] via INTRAVENOUS
  Filled 2016-05-26: qty 4000

## 2016-05-26 MED ORDER — COUMADIN BOOK
Freq: Once | Status: AC
  Administered 2016-05-26: 13:00:00
  Filled 2016-05-26: qty 1

## 2016-05-26 MED ORDER — SODIUM CHLORIDE 0.9 % IV SOLN
2.0000 g | Freq: Once | INTRAVENOUS | Status: AC
  Administered 2016-05-26: 2 g via INTRAVENOUS
  Filled 2016-05-26: qty 20

## 2016-05-26 MED ORDER — WARFARIN SODIUM 7.5 MG PO TABS: 7.5000 mg | ORAL_TABLET | Freq: Once | ORAL | Status: DC

## 2016-05-26 MED ORDER — ATORVASTATIN CALCIUM 20 MG PO TABS: 20.0000 mg | ORAL_TABLET | Freq: Every day | ORAL | 0 refills | Status: DC

## 2016-05-26 MED ORDER — ATORVASTATIN CALCIUM 20 MG PO TABS: 20.0000 mg | ORAL_TABLET | Freq: Every day | ORAL | Status: DC

## 2016-05-26 NOTE — Progress Notes (Signed)
PROGRESS NOTE    De Jaworski  LGX:[id number] DOB: April 10, 1956 DOA: 05/25/2016 PCP: Elyn Peers, MD   Outpatient Specialists:  Einar Gip   Brief Narrative:  Patrick [name] is a very pleasant 60 y.o. male with medical history significant for hypertension, diabetes, end-stage renal disease on dialysis, nephrectomy after failed transplant, CAD status post CABG, gout, hyperparathyroidism presents to the emergency Department chief complaint chest pain. Initial evaluation turning for A. fib with RVR and ACS.  Information is obtained from the patient. He states he awakened with left anterior chest pain. He describes it as an ache states that radiated down his left arm. He denies palpitations diaphoresis shortness of breath nausea or vomiting. He states lying down made the pain better walking around sitting up made the pain worse. The pain was intermittent but persistent so EMS was called. Reportedly they found the patient to be in A. fib with RVR with a rate of 150. He was given 10 mg of Cardizem he reports his pain has gone away. Denies lower extremity edema cough headache dizziness syncope or near-syncope. He denies diarrhea constipation melena bright red blood per rectum. He reports he no longer makes urine. He states he had his new patient visit with new PCP 2 days ago was diagnosed with a sinus infection and started antibiotics. He denies any fever chills.   Assessment & Plan:   Principal Problem:   Chest pain Active Problems:   Essential hypertension   Diabetes mellitus type 2, controlled (HCC)   Anemia in chronic kidney disease   Kidney transplant failure   Anomalous origin of left coronary artery   Elevated troponin   End-stage renal disease on hemodialysis ([facility])   PAF (paroxysmal atrial fibrillation) (H[facility])   Chest pain at rest -d-dimer elevated: VQ scan negative -musculoskeletal  A. Fib with RVR.  -sinus -seen by cards and EP-- ? Ablation   Elevated troponin. Initial troponin  mildly elevated in this dialysis patient. -flat   History renal transplant and rejection. Currently a Tuesday Thursday Saturday dialysis patient. -due for dialysis in AM -will notify nephrology if remains in hospital tomm  Diabetes.  -hold medications as blood sugars low  Hypertension. Controlled -Continue home meds  Anemia of chronic disease. Hemoglobin 10.2. Chart review indicates this is close to baseline. -defer to nephrology   Hyperkalemia. Mild. Potassium 5.2. -Monitor   DVT prophylaxis:  Heparin gtt  Code Status: Full Code   Family Communication:   Disposition Plan:     Consultants:   EP  cards    Subjective: Wants to eat  Objective: Vitals:   05/25/16 1754 05/25/16 2204 05/26/16 0556 05/26/16 0952  BP: (!) 117/92 120/84 118/80 134/79  Pulse: (!) 102 96 92 60  Resp:  16    Temp:  97.7 F (36.5 C) 97.8 F (36.6 C) 97.7 F (36.5 C)  TempSrc:  Oral Oral Oral  SpO2:  100% 98% 100%  Weight:   99.7 kg (219 lb 12.8 oz)   Height:        Intake/Output Summary (Last 24 hours) at 05/26/16 1129 Last data filed at 05/26/16 0547  Gross per 24 hour  Intake              120 ml  Output                0 ml  Net              120 ml   Filed Weights   05/25/16 1715 05/26/16  0556  Weight: 98.7 kg (217 lb 9.5 oz) 99.7 kg (219 lb 12.8 oz)    Examination:  General exam: Appears calm and comfortable  Respiratory system: Clear to auscultation. Respiratory effort normal. Cardiovascular system: S1 & S2 heard, RRR. No JVD, murmurs, rubs, gallops or clicks. No pedal edema. Gastrointestinal system: Abdomen is nondistended, soft and nontender. No organomegaly or masses felt. Normal bowel sounds heard. Central nervous system: Alert and oriented. No focal neurological deficits.     Data Reviewed: I have personally reviewed following labs and imaging studies  CBC:  Recent Labs Lab 05/25/16 1328 05/26/16 0333  WBC 5.5 5.7  NEUTROABS 2.7  --   HGB  10.2* 9.7*  HCT 31.5* 29.9*  MCV 96.9 95.5  PLT 139* 916*   Basic Metabolic Panel:  Recent Labs Lab 05/25/16 1328 05/26/16 0332  NA 137 136  K 5.2* 5.6*  CL 106 104  CO2 20* 19*  GLUCOSE 74 117*  BUN 51* 65*  CREATININE 12.95* 14.58*  CALCIUM 6.7* 6.4*   GFR: Estimated Creatinine Clearance: 5.9 mL/min (by C-G formula based on SCr of 14.58 mg/dL (H)). Liver Function Tests: No results for input(s): AST, ALT, ALKPHOS, BILITOT, PROT, ALBUMIN in the last 168 hours. No results for input(s): LIPASE, AMYLASE in the last 168 hours. No results for input(s): AMMONIA in the last 168 hours. Coagulation Profile: No results for input(s): INR, PROTIME in the last 168 hours. Cardiac Enzymes:  Recent Labs Lab 05/25/16 1328 05/25/16 1743  TROPONINI 0.03* 0.04*   BNP (last 3 results) No results for input(s): PROBNP in the last 8760 hours. HbA1C: No results for input(s): HGBA1C in the last 72 hours. CBG:  Recent Labs Lab 05/26/16 0218 05/26/16 0303 05/26/16 0745 05/26/16 0841 05/26/16 0929  GLUCAP 63* 88 66 67 104*   Lipid Profile:  Recent Labs  05/25/16 1542  CHOL 192  HDL 34*  LDLCALC 108*  TRIG 249*  CHOLHDL 5.6   Thyroid Function Tests:  Recent Labs  05/25/16 1328  TSH 5.113*   Anemia Panel: No results for input(s): VITAMINB12, FOLATE, FERRITIN, TIBC, IRON, RETICCTPCT in the last 72 hours.      Recent Results (from the past 240 hour(s))  MRSA PCR Screening     Status: None   Collection Time: 05/25/16  5:20 PM  Result Value Ref Range Status   MRSA by PCR NEGATIVE NEGATIVE Final    Comment:        The GeneXpert MRSA Assay (FDA approved for NASAL specimens only), is one component of a comprehensive MRSA colonization surveillance program. It is not intended to diagnose MRSA infection nor to guide or monitor treatment for MRSA infections.       Anti-infectives    None       Radiology Studies: Dg Chest 2 View  Result Date:  05/25/2016 CLINICAL DATA:  Chest pain EXAM: CHEST  2 VIEW COMPARISON:  01/25/2016 FINDINGS: Lungs are essentially clear. No focal consolidation. No pleural effusion or pneumothorax. Cardiomegaly. Postsurgical changes related to prior CABG. Left subclavian dual lumen dialysis catheter terminating at the cavoatrial junction. Right superior mediastinal vascular stent. Visualized osseous structures are within normal limits. Median sternotomy. IMPRESSION: No evidence of acute cardiopulmonary disease. Electronically Signed   By: Julian Hy M.D.   On: 05/25/2016 14:27   Nm Pulmonary Perf And Vent  Result Date: 05/25/2016 CLINICAL DATA:  Tachycardia with chest pain. EXAM: NUCLEAR MEDICINE VENTILATION - PERFUSION LUNG SCAN TECHNIQUE: Ventilation images were obtained in multiple projections  using inhaled aerosol Tc-70m DTPA. Perfusion images were obtained in multiple projections after intravenous injection of Tc-19m MAA. RADIOPHARMACEUTICALS:  32.3 mCi Technetium-5m DTPA aerosol inhalation and 4.0 mCi Technetium-63m MAA IV COMPARISON:  None. FINDINGS: Ventilation: No focal ventilation defect. Perfusion: No wedge shaped peripheral perfusion defects to suggest acute pulmonary embolism. Other: Cardiomegaly. IMPRESSION: No significant ventilatory or perfusion defects. Low probability for pulmonary emboli. Electronically Signed   By: Staci Righter M.D.   On: 05/25/2016 17:25        Scheduled Meds: . amLODipine  2.5 mg Oral Daily  . aspirin  325 mg Oral Daily  . atorvastatin  10 mg Oral q1800  . calcium carbonate  1 tablet Oral Q breakfast  . colchicine  0.6 mg Oral Q14 Days  . gi cocktail  30 mL Oral Once  . heparin  5,000 Units Subcutaneous Q8H  . Influenza vac split quadrivalent PF  0.5 mL Intramuscular Tomorrow-1000  . isosorbide mononitrate  30 mg Oral Daily  . metoprolol tartrate  100 mg Oral BID   Continuous Infusions:    LOS: 0 days    Time spent: 35 min    Dana, DO [facility] Pager [phone]  If 7PM-7AM, please contact night-coverage [email or web address] Password [email or web address] 05/26/2016, 11:29 AM

## 2016-05-26 NOTE — Discharge Summary (Signed)
Physician Discharge Summary  Patrick [name] NOM:[id number] DOB: 1956-05-16 DOA: 05/25/2016  PCP: Elyn Peers, MD  Admit date: 05/25/2016 Discharge date: 05/26/2016   Recommendations for Outpatient Follow-Up:   1. INR check on Thursday-- patient requesting labs (INR) be done during dialysis and sent to Dr. Irven Shelling office-- routing d/c summary to Dr. Loletha Grayer-- patient's nephrologist 2. Outpatient ablation 3. ASA 81 mg until INR > 2   Discharge Diagnosis:   Principal Problem:   Chest pain Active Problems:   Essential hypertension   Diabetes mellitus type 2, controlled (HCC)   Anemia in chronic kidney disease   Kidney transplant failure   Anomalous origin of left coronary artery   Elevated troponin   End-stage renal disease on hemodialysis (HCC)   PAF (paroxysmal atrial fibrillation) [facility])   Discharge disposition:  Home  Discharge Condition: Improved.  Diet recommendation: renal/carb mod  Wound care: None.   History of Present Illness:   Patrick [name] is a very pleasant 60 y.o. male with medical history significant for hypertension, diabetes, end-stage renal disease on dialysis, nephrectomy after failed transplant, CAD status post CABG, gout, hyperparathyroidism presents to the emergency Department chief complaint chest pain. Initial evaluation turning for A. fib with RVR and ACS.  Information is obtained from the patient. He states he awakened with left anterior chest pain. He describes it as an ache states that radiated down his left arm. He denies palpitations diaphoresis shortness of breath nausea or vomiting. He states lying down made the pain better walking around sitting up made the pain worse. The pain was intermittent but persistent so EMS was called. Reportedly they found the patient to be in A. fib with RVR with a rate of 150. He was given 10 mg of Cardizem he reports his pain has gone away. Denies lower extremity edema cough headache dizziness syncope or near-syncope.  He denies diarrhea constipation melena bright red blood per rectum. He reports he no longer makes urine. He states he had his new patient visit with new PCP 2 days ago was diagnosed with a sinus infection and started antibiotics. He denies any fever chills.   Hospital Course by Problem:   Chest pain at rest -d-dimer elevated: VQ scan negative -musculoskeletal  A. Fib with RVR.  -sinus -seen by cards and EP-- ? Ablation as outpatient-- on only metoprolol here- HR 60 Coumadin for now   Elevated troponin. Initial troponin mildly elevated in this dialysis patient. -flat   History renal transplant and rejection. Currently a Tuesday Thursday Saturday dialysis patient. -due for dialysis in AM   Diabetes.  -resume home meds  Hypertension. Controlled -Continue home meds  Anemia of chronic disease. Hemoglobin 10.2. Chart review indicates this is close to baseline. -defer to nephrology   Hyperkalemia. Mild. Potassium 5.2. -Monitor    Medical Consultants:   EP cards  Discharge Exam:   Vitals:   05/26/16 0556 05/26/16 0952  BP: 118/80 134/79  Pulse: 92 60  Resp:    Temp: 97.8 F (36.6 C) 97.7 F (36.5 C)   Vitals:   05/25/16 1754 05/25/16 2204 05/26/16 0556 05/26/16 0952  BP: (!) 117/92 120/84 118/80 134/79  Pulse: (!) 102 96 92 60  Resp:  16    Temp:  97.7 F (36.5 C) 97.8 F (36.6 C) 97.7 F (36.5 C)  TempSrc:  Oral Oral Oral  SpO2:  100% 98% 100%  Weight:   99.7 kg (219 lb 12.8 oz)   Height:  Gen:  NAD    The results of significant diagnostics from this hospitalization (including imaging, microbiology, ancillary and laboratory) are listed below for reference.     Procedures and Diagnostic Studies:   Dg Chest 2 View  Result Date: 05/25/2016 CLINICAL DATA:  Chest pain EXAM: CHEST  2 VIEW COMPARISON:  01/25/2016 FINDINGS: Lungs are essentially clear. No focal consolidation. No pleural effusion or pneumothorax. Cardiomegaly. Postsurgical  changes related to prior CABG. Left subclavian dual lumen dialysis catheter terminating at the cavoatrial junction. Right superior mediastinal vascular stent. Visualized osseous structures are within normal limits. Median sternotomy. IMPRESSION: No evidence of acute cardiopulmonary disease. Electronically Signed   By: Julian Hy M.D.   On: 05/25/2016 14:27   Nm Pulmonary Perf And Vent  Result Date: 05/25/2016 CLINICAL DATA:  Tachycardia with chest pain. EXAM: NUCLEAR MEDICINE VENTILATION - PERFUSION LUNG SCAN TECHNIQUE: Ventilation images were obtained in multiple projections using inhaled aerosol Tc-14m DTPA. Perfusion images were obtained in multiple projections after intravenous injection of Tc-68m MAA. RADIOPHARMACEUTICALS:  32.3 mCi Technetium-80m DTPA aerosol inhalation and 4.0 mCi Technetium-12m MAA IV COMPARISON:  None. FINDINGS: Ventilation: No focal ventilation defect. Perfusion: No wedge shaped peripheral perfusion defects to suggest acute pulmonary embolism. Other: Cardiomegaly. IMPRESSION: No significant ventilatory or perfusion defects. Low probability for pulmonary emboli. Electronically Signed   By: Staci Righter M.D.   On: 05/25/2016 17:25     Labs:   Basic Metabolic Panel:  Recent Labs Lab 05/25/16 1328 05/26/16 0332  NA 137 136  K 5.2* 5.6*  CL 106 104  CO2 20* 19*  GLUCOSE 74 117*  BUN 51* 65*  CREATININE 12.95* 14.58*  CALCIUM 6.7* 6.4*   GFR Estimated Creatinine Clearance: 5.9 mL/min (by C-G formula based on SCr of 14.58 mg/dL (H)). Liver Function Tests: No results for input(s): AST, ALT, ALKPHOS, BILITOT, PROT, ALBUMIN in the last 168 hours. No results for input(s): LIPASE, AMYLASE in the last 168 hours. No results for input(s): AMMONIA in the last 168 hours. Coagulation profile No results for input(s): INR, PROTIME in the last 168 hours.  CBC:  Recent Labs Lab 05/25/16 1328 05/26/16 0333  WBC 5.5 5.7  NEUTROABS 2.7  --   HGB 10.2* 9.7*  HCT  31.5* 29.9*  MCV 96.9 95.5  PLT 139* 123*   Cardiac Enzymes:  Recent Labs Lab 05/25/16 1328 05/25/16 1743  TROPONINI 0.03* 0.04*   BNP: Invalid input(s): POCBNP CBG:  Recent Labs Lab 05/26/16 0745 05/26/16 0841 05/26/16 0929 05/26/16 1111 05/26/16 1137  GLUCAP 66 67 104* 51* 70   D-Dimer  Recent Labs  05/25/16 1328  DDIMER 3.90*   Hgb A1c No results for input(s): HGBA1C in the last 72 hours. Lipid Profile  Recent Labs  05/25/16 1542  CHOL 192  HDL 34*  LDLCALC 108*  TRIG 249*  CHOLHDL 5.6   Thyroid function studies  Recent Labs  05/25/16 1328  TSH 5.113*   Anemia work up No results for input(s): VITAMINB12, FOLATE, FERRITIN, TIBC, IRON, RETICCTPCT in the last 72 hours. Microbiology Recent Results (from the past 240 hour(s))  MRSA PCR Screening     Status: None   Collection Time: 05/25/16  5:20 PM  Result Value Ref Range Status   MRSA by PCR NEGATIVE NEGATIVE Final    Comment:        The GeneXpert MRSA Assay (FDA approved for NASAL specimens only), is one component of a comprehensive MRSA colonization surveillance program. It is not intended to diagnose MRSA  infection nor to guide or monitor treatment for MRSA infections.      Discharge Instructions:   Discharge Instructions    Diet Carb Modified    Complete by:  As directed    Discharge instructions    Complete by:  As directed    INR check with Dr. Einar Gip on Thursday   Increase activity slowly    Complete by:  As directed        Medication List    STOP taking these medications   carvedilol 25 MG tablet Commonly known as:  COREG     TAKE these medications   amLODipine 2.5 MG tablet Commonly known as:  NORVASC Take 2.5 mg by mouth daily.   aspirin 81 MG tablet Take 81 mg by mouth daily.   atorvastatin 20 MG tablet Commonly known as:  LIPITOR Take 1 tablet (20 mg total) by mouth daily at 6 PM. What changed:  medication strength  how much to take   calcium  carbonate 1250 (500 Ca) MG tablet Commonly known as:  OS-CAL - dosed in mg of elemental calcium Take 1 tablet by mouth daily with breakfast.   colchicine 0.6 MG tablet Take 1 tablet (0.6 mg total) by mouth every 14 (fourteen) days. Start taking on:  06/08/2016   ipratropium-albuterol 0.5-2.5 (3) MG/3ML Soln Commonly known as:  DUONEB Take 3 mLs by nebulization every 4 (four) hours as needed.   isosorbide mononitrate 30 MG 24 hr tablet Commonly known as:  IMDUR Take 1 tablet (30 mg total) by mouth daily.   metoprolol 100 MG tablet Commonly known as:  LOPRESSOR Take 1 tablet (100 mg total) by mouth 2 (two) times daily.   repaglinide 0.5 MG tablet Commonly known as:  PRANDIN Take 1 tablet (0.5 mg total) by mouth 2 (two) times daily before a meal. What changed:  when to take this   warfarin 7.5 MG tablet Commonly known as:  COUMADIN Take 1 tablet (7.5 mg total) by mouth daily at 6 PM.      Follow-up Information    Adrian Prows, MD Follow up on 06/10/2016.   Specialty:  Cardiology Why:  Come at 10:15 for appointment at 10:30 AM. Bring all medicataions Contact information: [address] [place] [address] [phone]        Elyn Peers, MD Follow up in 1 week(s).   Specialty:  Family Medicine Contact information: [address] [place] [address] [phone]            Time coordinating discharge: 35 min  Signed:  Kaedyn Belardo U Ascencion Coye   [facility] 05/26/2016, 2:31 PM

## 2016-05-26 NOTE — Progress Notes (Signed)
Inpatient Diabetes Program Recommendations  AACE/ADA: New Consensus Statement on Inpatient Glycemic Control (2015)  Target Ranges:  Prepandial:   less than 140 mg/dL      Peak postprandial:   less than 180 mg/dL (1-2 hours)      Critically ill patients:  140 - 180 mg/dL   Results for Patrick [name], Patrick [name] (MRN [id number]) as of 05/26/2016 10:46  Ref. Range 05/26/2016 02:18 05/26/2016 03:03 05/26/2016 07:45 05/26/2016 08:41 05/26/2016 09:29  Glucose-Capillary Latest Ref Range: 65 - 99 mg/dL 63 (L) 88 66 67 104 (H)    Admit with: CP  History: DM, ESRD  Home DM Meds: Prandin 0.5 mg BID  Current Insulin Orders: Prandin 0.5 mg BID       MD- Patient received dose of Prandin last PM.  Hypoglycemic since 2am.  Please discontinue Prandin for now but continue CBG checks.      --Will follow patient during hospitalization--  Wyn Quaker RN, MSN, CDE Diabetes Coordinator Inpatient Glycemic Control Team Team Pager: [phone] (8a-5p)

## 2016-05-26 NOTE — Consult Note (Addendum)
CARDIOLOGY CONSULT NOTE  Patient ID: Patrick [name] MRN: [id number] DOB/AGE: 03-07-1956 60 y.o.  Admit date: 05/25/2016 Referring Physician  Eulogio Bear, DO Primary Physician:  Elyn Peers, MD Reason for Consultation  Chest pain  HPI: Patrick [name]  is a 60 y.o. 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 He has undergone ligation of the LAD and LIMA to LAD bypass surgery in 1997. In 2016 for atypical chest pain he has had nuclear stress test which had revealed inferior ischemia. However due to multiple medical comorbidities, medical therapy was recommended. He has history of hypertension, hyperlipidemia, end-stage disease due to diabetes mellitus and has had transportation which is now failed. He is presently on hemodialysis.  He was doing well until yesterday, complained of sharp pain in the left lower part of his chest at the site of [place] catheter. Pain described as sharp pain, points with one finger on the left side of the chest. No recurrence since admission. He had called the EMS, he was found to be tachycardic, heart rate of 150 bpm for which he received IV diltiazem. In the emergency room he had paroxysmal episode of tachycardia, but rhythm strips could not be captured. He is admitted for value patient of chest pain.  Patient states that he is essentially asymptomatic this morning. Feels well. He was admitted in June 2017 with severe anemia and non-ST elevation myocardial infarction. After correction of anemia, he was asymptomatic, hence no further analysis was performed. Patient previously has had GI evaluation and there is no obvious source of bleeding. Anemia felt to be due to anemia of chronic disease and renal failure.  Past Medical History:  Diagnosis Date  . Diabetes mellitus without complication ([place])   . End-stage renal failure with renal transplant ([employer])   . Gout   . Hypertension   . Impotence of organic  origin   . Secondary hyperparathyroidism ([place])   . Type I (juvenile type) diabetes mellitus with renal manifestations, not stated as uncontrolled(250.41)      Past Surgical History:  Procedure Laterality Date  . APPENDECTOMY    . AV FISTULA PLACEMENT Right 09/18/2014   Procedure: INSERTION OF ARTERIOVENOUS (AV) GORE-TEX GRAFT ARM USING 4-7MM  X 45CM STRETCH GORE-TEX VASCULAR GRAFT;  Surgeon: Rosetta Posner, MD;  Location: [facility];  Service: Vascular;  Laterality: Right;  . birth defect heart surgery  1996  . IR GENERIC HISTORICAL  05/11/2016   IR FLUORO GUIDE CV LINE LEFT 05/11/2016 Corrie Mckusick, DO [facility]  . IR GENERIC HISTORICAL  05/11/2016   IR US GUIDE VASC ACCESS LEFT 05/11/2016 Corrie Mckusick, DO [facility]  . IR GENERIC HISTORICAL  05/11/2016   IR US GUIDE VASC ACCESS RIGHT 05/11/2016 Corrie Mckusick, DO [facility]  . IR GENERIC HISTORICAL  05/11/2016   IR RADIOLOGY PERIPHERAL GUIDED IV START 05/11/2016 Corrie Mckusick, DO [facility]  . KIDNEY TRANSPLANT  2009     Family History  Problem Relation Age of Onset  . Hyperlipidemia Mother   . Hypertension Mother      Social History: Social History   Social History  . Marital status: Married    Spouse name: N/A  . Number of children: N/A  . Years of education: N/A   Occupational History  . Not on file.   Social History Main Topics  . Smoking status: Former Research scientist (life sciences)  . Smokeless tobacco: Not on file  . Alcohol use No  .  Drug use: No  . Sexual activity: Not Currently    Birth control/ protection: None   Other Topics Concern  . Not on file   Social History Narrative  . No narrative on file     Prescriptions Prior to Admission  Medication Sig Dispense Refill Last Dose  . amLODipine (NORVASC) 2.5 MG tablet Take 2.5 mg by mouth daily.   05/25/2016 at Unknown time  . aspirin 81 MG tablet Take 81 mg by mouth daily.   05/25/2016 at Unknown time  . calcium carbonate (OS-CAL - DOSED IN MG OF ELEMENTAL CALCIUM) 1250  (500 Ca) MG tablet Take 1 tablet by mouth daily with breakfast.   05/25/2016 at Unknown time  . carvedilol (COREG) 25 MG tablet Take 25 mg by mouth 2 (two) times daily with a meal.   05/25/2016 at 8a   . colchicine 0.6 MG tablet Take 0.5 tablets (0.3 mg total) by mouth daily. (Patient taking differently: Take 0.6 mg by mouth every 14 (fourteen) days. ) 30 tablet 0 Past Week at Unknown time  . ipratropium-albuterol (DUONEB) 0.5-2.5 (3) MG/3ML SOLN Take 3 mLs by nebulization every 4 (four) hours as needed. 360 mL  Past Week at Unknown time  . isosorbide mononitrate (IMDUR) 30 MG 24 hr tablet Take 1 tablet (30 mg total) by mouth daily. 30 tablet 0 05/24/2016 at Unknown time  . repaglinide (PRANDIN) 0.5 MG tablet Take 1 tablet (0.5 mg total) by mouth 2 (two) times daily before a meal. (Patient taking differently: Take 0.5 mg by mouth 2 (two) times daily. ) 60 tablet 11 05/24/2016 at Unknown time  . atorvastatin (LIPITOR) 10 MG tablet Take 1 tablet (10 mg total) by mouth daily at 6 PM. (Patient not taking: Reported on 05/25/2016)   Not Taking at Unknown time    ROS: General: no fevers/chills/night sweats Eyes: no blurry vision, diplopia, or amaurosis ENT: no sore throat or hearing loss Resp: no cough, wheezing, or hemoptysis CV: no edema or palpitations GI: no abdominal pain, nausea, vomiting, diarrhea, or constipation GU: no dysuria, frequency, or hematuria Skin: no rash Neuro: no headache, numbness, tingling, or weakness of extremities Musculoskeletal: no joint pain or swelling Heme: no bleeding, DVT, or easy bruising Endo: no polydipsia or polyuria    Physical Exam: Blood pressure 134/79, pulse 60, temperature 97.7 F (36.5 C), temperature source Oral, resp. rate 16, height 5\' 6"  (1.676 m), weight 99.7 kg (219 lb 12.8 oz), SpO2 100 %.   General appearance: alert, cooperative, no distress and moderately obese Lungs: clear to auscultation bilaterally Heart: regular rate and rhythm, S1, S2  normal, no murmur, click, rub or gallop Extremities: extremities normal, atraumatic, no cyanosis or edema Pulses: Bilateral carotids normal without bruit, bilateral femorals 2+ without bruit. Popliteal pulses could not be felt. Pedal pulses are absent. Capillary refill normal. Skin: Skin color, texture, turgor normal. No rashes or lesions or Skin appears dry Neurologic: Grossly normal  Labs:   Lab Results  Component Value Date   WBC 5.7 05/26/2016   HGB 9.7 (L) 05/26/2016   HCT 29.9 (L) 05/26/2016   MCV 95.5 05/26/2016   PLT 123 (L) 05/26/2016    Recent Labs Lab 05/26/16 0332  NA 136  K 5.6*  CL 104  CO2 19*  BUN 65*  CREATININE 14.58*  CALCIUM 6.4*  GLUCOSE 117*    Lipid Panel     Component Value Date/Time   CHOL 192 05/25/2016 1542   TRIG 249 (H) 05/25/2016 1542  HDL 34 (L) 05/25/2016 1542   CHOLHDL 5.6 05/25/2016 1542   VLDL 50 (H) 05/25/2016 1542   LDLCALC 108 (H) 05/25/2016 1542    Recent Labs  10/30/15 0855 11/17/15 0759 01/22/16 0735  BNP 2,633.8* 2,608.9* >4,500.0*    HEMOGLOBIN A1C Lab Results  Component Value Date   HGBA1C 5.1 03/26/2016   MPG 80 01/22/2016    Recent Labs  01/22/16 2236 05/25/16 1328 05/25/16 1743  TROPONINI 4.49* 0.03* 0.04*    Recent Labs  11/19/15 0441 11/20/15 0806 05/25/16 1328  TSH 7.292* 9.512* 5.113*    EKG: 05/25/2016: Atrial flutter with 2:1 conduction, nonspecific T abnormality..  Echocardiogram 01/25/2016: LVEF 45-50% with diffuse hypokinesis. Grade 2 diastolic dysfunction, mild aortic valve stenosis, peak gradient 26, mean gradient 13 mmHg. Mild MR.  Lexiscan myoview stress test 01/08/2015: 1. The resting electrocardiogram demonstrated normal sinus rhythm and no resting arrhythmias. Non specific T inversion high lateral leads. Stress EKG is non-diagnostic for ischemia as it a pharmacologic stress using Lexiscan. Stress symptoms included dyspnea. 2. The perfusion imaging study demonstrates a very  small-sized moderate ischemia in the basal inferior, inferoapical and apical septal region. Left ventricle systolic function was preserved with an ejection fraction of 55%. This is an abnormal stress test, however low risk due to size of the defect.   Radiology: Dg Chest 2 View  Result Date: 05/25/2016 CLINICAL DATA:  Chest pain EXAM: CHEST  2 VIEW COMPARISON:  01/25/2016 FINDINGS: Lungs are essentially clear. No focal consolidation. No pleural effusion or pneumothorax. Cardiomegaly. Postsurgical changes related to prior CABG. Left subclavian dual lumen dialysis catheter terminating at the cavoatrial junction. Right superior mediastinal vascular stent. Visualized osseous structures are within normal limits. Median sternotomy. IMPRESSION: No evidence of acute cardiopulmonary disease. Electronically Signed   By: Julian Hy M.D.   On: 05/25/2016 14:27   Nm Pulmonary Perf And Vent  Result Date: 05/25/2016 CLINICAL DATA:  Tachycardia with chest pain. EXAM: NUCLEAR MEDICINE VENTILATION - PERFUSION LUNG SCAN TECHNIQUE: Ventilation images were obtained in multiple projections using inhaled aerosol Tc-3m DTPA. Perfusion images were obtained in multiple projections after intravenous injection of Tc-59m MAA. RADIOPHARMACEUTICALS:  32.3 mCi Technetium-56m DTPA aerosol inhalation and 4.0 mCi Technetium-53m MAA IV COMPARISON:  None. FINDINGS: Ventilation: No focal ventilation defect. Perfusion: No wedge shaped peripheral perfusion defects to suggest acute pulmonary embolism. Other: Cardiomegaly. IMPRESSION: No significant ventilatory or perfusion defects. Low probability for pulmonary emboli. Electronically Signed   By: Staci Righter M.D.   On: 05/25/2016 17:25    Scheduled Meds: . amLODipine  2.5 mg Oral Daily  . aspirin  325 mg Oral Daily  . atorvastatin  10 mg Oral q1800  . calcium carbonate  1 tablet Oral Q breakfast  . colchicine  0.6 mg Oral Q14 Days  . gi cocktail  30 mL Oral Once  . heparin   5,000 Units Subcutaneous Q8H  . Influenza vac split quadrivalent PF  0.5 mL Intramuscular Tomorrow-1000  . isosorbide mononitrate  30 mg Oral Daily  . metoprolol tartrate  100 mg Oral BID   Continuous Infusions:  PRN Meds:.acetaminophen, gi cocktail, ipratropium-albuterol, morphine injection, ondansetron (ZOFRAN) IV, technetium TC 54M diethylenetriame-pentaacetic acid  ASSESSMENT AND PLAN:  1. New onset atrial flutter, typical with 2:1 AV conduction.  CHA2DS2-VASCScore: Risk Score  3,  Yearly risk of stroke  3.2. Recommendation: ASA No/Anticoagulation Yes  2. Hypertension. 3. Hyperlipidemia 4. Diabetes mellitus type 2 controlled without hyperglycemia  5. Hypertension 6. End-stage renal disease on hemodialysis 7.  Anemia of chronic disease, chronic thrombocytopenia 8. Peripheral arterial disease by physical exam, remains asymptomatic.  Recommendation: I have started the patient on IV heparin and Coumadin for anticoagulation. Made a referral for EP evaluation regarding atrial flutter, I prefer patient to have ablation to reduce the risk of bleeding complications on long-term anticoagulation. Chest pain is clearly musculoskeletal and easily reproducible. Given his medical comorbidities, chronic anemia, patient preference, I have not recommended coronary angiography. Discussed weight loss. Will continue to follow. I have increased the dose of atorvastatin from 10 mg to 20 mg.    Adrian Prows, MD 05/26/2016, 10:58 AM [facility] [place] Pager: [phone] Office: [phone] If no answer Cell [phone]

## 2016-05-26 NOTE — Progress Notes (Signed)
Discharged home with family, to follow up with with Dr. Einar Gip outpatient for ablation.

## 2016-05-26 NOTE — Care Management Obs Status (Signed)
[facility] NOTIFICATION   Patient Details  Name: Patrick [name] MRN: [id number] Date of Birth: June 18, 1956   Medicare Observation Status Notification Given:  Yes    Bethena Roys, RN 05/26/2016, 10:34 AM

## 2016-05-26 NOTE — Progress Notes (Signed)
Hypoglycemic Event  CBG: 63  Treatment: 15 GM carbohydrate snack  Symptoms: sweaty, hungry   Follow-up CBG: Time:0303 CBG Result:88  Possible Reasons for Event: Inadequate meal intake  Comments/MD notified:    Jodell Cipro

## 2016-05-26 NOTE — Consult Note (Signed)
ELECTROPHYSIOLOGY CONSULT NOTE    Patient ID: Patrick [name] MRN: [id number], DOB/AGE: 1955-10-11 60 y.o.  Admit date: 05/25/2016 Date of Consult: 05/26/2016  Primary Physician: Elyn Peers, MD Primary Cardiologist: Gillermina Hu MD: Einar Gip  Reason for Consultation: atrial flutter  HPI:  Corderius Saraceni is a 60 y.o. 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 Yesterday, he developed sharp chest pain at the site of his HD catheter.  He called EMS and on their arrival was found to be in atrial flutter. He was given Cardizem with improvement in heart rate.  He has since converted to SR. His chest pain is currently improved. He has had no LE edema, syncope, pre-syncope, or palpitations.  Echo 12/2015 demonstrated EF 45-50%, diffuse hypokinesis, grade 2 diastolic dysfunction, LA 39.    Past Medical History:  Diagnosis Date  . Diabetes mellitus without complication ([place])   . End-stage renal failure with renal transplant ([place])   . Gout   . Hypertension   . Impotence of organic origin   . Secondary hyperparathyroidism ([place])   . Type I (juvenile type) diabetes mellitus with renal manifestations, not stated as uncontrolled(250.41)      Surgical History:  Past Surgical History:  Procedure Laterality Date  . APPENDECTOMY    . AV FISTULA PLACEMENT Right 09/18/2014   Procedure: INSERTION OF ARTERIOVENOUS (AV) GORE-TEX GRAFT ARM USING 4-7MM  X 45CM STRETCH GORE-TEX VASCULAR GRAFT;  Surgeon: Rosetta Posner, MD;  Location: [facility];  Service: Vascular;  Laterality: Right;  . birth defect heart surgery  1996  . IR GENERIC HISTORICAL  05/11/2016   IR FLUORO GUIDE CV LINE LEFT 05/11/2016 Corrie Mckusick, DO [facility]  . IR GENERIC HISTORICAL  05/11/2016   IR US GUIDE VASC ACCESS LEFT 05/11/2016 Corrie Mckusick, DO [facility]  . IR  GENERIC HISTORICAL  05/11/2016   IR US GUIDE VASC ACCESS RIGHT 05/11/2016 Corrie Mckusick, DO [facility]  . IR GENERIC HISTORICAL  05/11/2016   IR RADIOLOGY PERIPHERAL GUIDED IV START 05/11/2016 Corrie Mckusick, DO [facility]  . KIDNEY TRANSPLANT  2009     Prescriptions Prior to Admission  Medication Sig Dispense Refill Last Dose  . amLODipine (NORVASC) 2.5 MG tablet Take 2.5 mg by mouth daily.   05/25/2016 at Unknown time  . aspirin 81 MG tablet Take 81 mg by mouth daily.   05/25/2016 at Unknown time  . calcium carbonate (OS-CAL - DOSED IN MG OF ELEMENTAL CALCIUM) 1250 (500 Ca) MG tablet Take 1 tablet by mouth daily with breakfast.   05/25/2016 at Unknown time  . carvedilol (COREG) 25 MG tablet Take 25 mg by mouth 2 (two) times daily with a meal.   05/25/2016 at 8a   . colchicine 0.6 MG tablet Take 0.5 tablets (0.3 mg total) by mouth daily. (Patient taking differently: Take 0.6 mg by mouth every 14 (fourteen) days. ) 30 tablet 0 Past Week at Unknown time  . ipratropium-albuterol (DUONEB) 0.5-2.5 (3) MG/3ML SOLN Take 3 mLs by nebulization every 4 (four) hours as needed. 360 mL  Past Week at Unknown time  . isosorbide mononitrate (IMDUR) 30 MG 24 hr tablet Take 1 tablet (30 mg total) by mouth daily. 30 tablet 0 05/24/2016 at Unknown time  . repaglinide (PRANDIN) 0.5 MG tablet Take 1 tablet (0.5 mg total) by mouth 2 (two) times daily  before a meal. (Patient taking differently: Take 0.5 mg by mouth 2 (two) times daily. ) 60 tablet 11 05/24/2016 at Unknown time  . atorvastatin (LIPITOR) 10 MG tablet Take 1 tablet (10 mg total) by mouth daily at 6 PM. (Patient not taking: Reported on 05/25/2016)   Not Taking at Unknown time    Inpatient Medications:  . amLODipine  2.5 mg Oral Daily  . aspirin  325 mg Oral Daily  . atorvastatin  10 mg Oral q1800  . calcium carbonate  1 tablet Oral Q breakfast  . colchicine  0.6 mg Oral Q14 Days  . gi cocktail  30 mL Oral Once  . heparin  5,000 Units Subcutaneous  Q8H  . Influenza vac split quadrivalent PF  0.5 mL Intramuscular Tomorrow-1000  . isosorbide mononitrate  30 mg Oral Daily  . metoprolol tartrate  100 mg Oral BID  . repaglinide  0.5 mg Oral BID    Allergies:  Allergies  Allergen Reactions  . Iodinated Diagnostic Agents Rash    Other Reaction: burning to mouth, swelling of lips.    Social History   Social History  . Marital status: Married    Spouse name: N/A  . Number of children: N/A  . Years of education: N/A   Occupational History  . Not on file.   Social History Main Topics  . Smoking status: Former Research scientist (life sciences)  . Smokeless tobacco: Not on file  . Alcohol use No  . Drug use: No  . Sexual activity: Not Currently    Birth control/ protection: None   Other Topics Concern  . Not on file   Social History Narrative  . No narrative on file     Family History  Problem Relation Age of Onset  . Hyperlipidemia Mother   . Hypertension Mother      Review of Systems: All other systems reviewed and are otherwise negative except as noted above.  Physical Exam: Vitals:   05/25/16 1754 05/25/16 2204 05/26/16 0556 05/26/16 0952  BP: (!) 117/92 120/84 118/80 134/79  Pulse: (!) 102 96 92 60  Resp:  16    Temp:  97.7 F (36.5 C) 97.8 F (36.6 C) 97.7 F (36.5 C)  TempSrc:  Oral Oral Oral  SpO2:  100% 98% 100%  Weight:   219 lb 12.8 oz (99.7 kg)   Height:        GEN- The patient is obese appearing, alert and oriented x 3 today.   HEENT: normocephalic, atraumatic; sclera clear, conjunctiva pink; hearing intact; oropharynx clear; neck supple  Lungs- Clear to ausculation bilaterally, normal work of breathing.  No wheezes, rales, rhonchi Heart- Regular rate and rhythm  GI- soft, non-tender, non-distended, bowel sounds present  Extremities- no clubbing, cyanosis, or edema  MS- no significant deformity or atrophy Skin- warm and dry, no rash or lesion, HD catheter LU chest Psych- euthymic mood, full affect Neuro- strength  and sensation are intact  Labs:   Lab Results  Component Value Date   WBC 5.7 05/26/2016   HGB 9.7 (L) 05/26/2016   HCT 29.9 (L) 05/26/2016   MCV 95.5 05/26/2016   PLT 123 (L) 05/26/2016    Recent Labs Lab 05/26/16 0332  NA 136  K 5.6*  CL 104  CO2 19*  BUN 65*  CREATININE 14.58*  CALCIUM 6.4*  GLUCOSE 117*      Radiology/Studies: Dg Chest 2 View Result Date: 05/25/2016 CLINICAL DATA:  Chest pain EXAM: CHEST  2 VIEW COMPARISON:  01/25/2016  FINDINGS: Lungs are essentially clear. No focal consolidation. No pleural effusion or pneumothorax. Cardiomegaly. Postsurgical changes related to prior CABG. Left subclavian dual lumen dialysis catheter terminating at the cavoatrial junction. Right superior mediastinal vascular stent. Visualized osseous structures are within normal limits. Median sternotomy. IMPRESSION: No evidence of acute cardiopulmonary disease. Electronically Signed   By: Julian Hy M.D.   On: 05/25/2016 14:27   EKG: 2:1 atrial flutter  TELEMETRY: atrial flutter with conversion to SR  Assessment/Plan: 1.  Atrial flutter EKG is not clearly typical, but telemetry looks more so.  He Mateusz Neilan need anticoagulation for CHADS2VASC of at least 2 I have told the patient we have 3 options for management of atrial flutter:  EPS and ablation if isthmus dependent, amiodarone for rhythm control, or watchful waiting as this is his first episode.  If EPS revealed isthmus dependent right atrial flutter, he would not need long term anticoagulation.  The patient would like to think about it and discuss further with Dr Curt Bears this afternoon. As he is now in SR, ablation could be done electively as outpatient down the road.  2.  HTN Stable No change required today  3.  ESRD on HD Management per nephrology  Chanetta Marshall, NP 05/26/2016 11:14 AM    I have seen and examined this patient with Chanetta Marshall.  Agree with above, note added to reflect my findings.  On exam, regular  rhythm, no murmurs, lungs clear. Presented to the hospital in atrial flutter complaining of chest pain.  Converted to sinus rhythm.  Discussed options with the patient.  Risks and benefits of ablation discussed.  Risks include but not limited to bleeding, tamponade, heart block, stroke.  He understands these risks and has agreed to the procedure.  Jamoni Broadfoot set him up as an outpatient.    Serita Degroote M. Anniya Whiters MD 05/26/2016 2:01 PM

## 2016-05-26 NOTE — Progress Notes (Signed)
Hypoglycemic Event  CBG: 67  Treatment: 25 ml Dextrose 50  Symptoms: None  Follow-up CBG: Time: 0930 CBG Result: 104  Possible Reasons for Event: NPO  Comments/MD notified: Yes, Dr. Laren Everts, Marlen Mollica [facility]

## 2016-05-26 NOTE — Progress Notes (Signed)
ANTICOAGULATION CONSULT NOTE - Initial Consult  Pharmacy Consult for Heparin and Coumadin Indication: atrial fibrillation  Allergies  Allergen Reactions  . Iodinated Diagnostic Agents Rash    Other Reaction: burning to mouth, swelling of lips.    Patient Measurements: Height: 5\' 6"  (167.6 cm) Weight: 219 lb 12.8 oz (99.7 kg) IBW/kg (Calculated) : 63.8 Heparin Dosing Weight: 85.4 kg  Vital Signs: Temp: 97.7 F (36.5 C) (10/30 0952) Temp Source: Oral (10/30 0952) BP: 134/79 (10/30 0952) Pulse Rate: 60 (10/30 0952)  Labs:  Recent Labs  05/25/16 1328 05/25/16 1743 05/26/16 0332 05/26/16 0333  HGB 10.2*  --   --  9.7*  HCT 31.5*  --   --  29.9*  PLT 139*  --   --  123*  CREATININE 12.95*  --  14.58*  --   TROPONINI 0.03* 0.04*  --   --     Estimated Creatinine Clearance: 5.9 mL/min (by C-G formula based on SCr of 14.58 mg/dL (H)).   Medical History: Past Medical History:  Diagnosis Date  . Diabetes mellitus without complication ([place])   . End-stage renal failure with renal transplant ([place])   . Gout   . Hypertension   . Impotence of organic origin   . Secondary hyperparathyroidism ([place])   . Type I (juvenile type) diabetes mellitus with renal manifestations, not stated as uncontrolled(250.41)     Medications:  Scheduled:  . amLODipine  2.5 mg Oral Daily  . atorvastatin  20 mg Oral q1800  . calcium carbonate  1 tablet Oral Q breakfast  . colchicine  0.6 mg Oral Q14 Days  . gi cocktail  30 mL Oral Once  . Influenza vac split quadrivalent PF  0.5 mL Intramuscular Tomorrow-1000  . isosorbide mononitrate  30 mg Oral Daily  . metoprolol tartrate  100 mg Oral BID   Infusions:    Assessment: 60 yo M presented to the ED 10/29 with CP.  Found to be in afib with RVR.  Pharmacy asked to anticoagulate with heparin and Coumadin. No bleeding hx noted.  Hgb ~ 10, PLTC 130s.  Goal of Therapy:  INR 2-3 Heparin level 0.3-0.7 units/ml Monitor platelets by anticoagulation  protocol: Yes   Plan:  Give 4000 units bolus x 1 Start heparin infusion at 1250 units/hr Check anti-Xa level in 8 hours and daily while on heparin Continue to monitor H&H and platelets  Coumadin 7.5mg  PO x 1 tonight Daily INR  [employer], Pharm.D., BCPS Clinical Pharmacist Pager [phone] 05/26/2016 12:03 PM

## 2016-05-26 NOTE — Progress Notes (Signed)
Refuses blood draw; saying "no more today"

## 2016-05-27 DIAGNOSIS — D631 Anemia in chronic kidney disease: Secondary | ICD-10-CM | POA: Diagnosis not present

## 2016-05-27 DIAGNOSIS — T8610 Unspecified complication of kidney transplant: Secondary | ICD-10-CM | POA: Diagnosis not present

## 2016-05-27 DIAGNOSIS — N186 End stage renal disease: Secondary | ICD-10-CM | POA: Diagnosis not present

## 2016-05-27 DIAGNOSIS — Z992 Dependence on renal dialysis: Secondary | ICD-10-CM | POA: Diagnosis not present

## 2016-05-27 DIAGNOSIS — E1029 Type 1 diabetes mellitus with other diabetic kidney complication: Secondary | ICD-10-CM | POA: Diagnosis not present

## 2016-05-27 DIAGNOSIS — D509 Iron deficiency anemia, unspecified: Secondary | ICD-10-CM | POA: Diagnosis not present

## 2016-05-28 ENCOUNTER — Encounter: Payer: Self-pay | Admitting: Vascular Surgery

## 2016-05-29 ENCOUNTER — Telehealth: Payer: Self-pay | Admitting: *Deleted

## 2016-05-29 DIAGNOSIS — E875 Hyperkalemia: Secondary | ICD-10-CM | POA: Diagnosis not present

## 2016-05-29 DIAGNOSIS — N186 End stage renal disease: Secondary | ICD-10-CM | POA: Diagnosis not present

## 2016-05-29 DIAGNOSIS — D631 Anemia in chronic kidney disease: Secondary | ICD-10-CM | POA: Diagnosis not present

## 2016-05-29 DIAGNOSIS — Z23 Encounter for immunization: Secondary | ICD-10-CM | POA: Diagnosis not present

## 2016-05-29 DIAGNOSIS — E1029 Type 1 diabetes mellitus with other diabetic kidney complication: Secondary | ICD-10-CM | POA: Diagnosis not present

## 2016-05-29 DIAGNOSIS — D509 Iron deficiency anemia, unspecified: Secondary | ICD-10-CM | POA: Diagnosis not present

## 2016-05-29 NOTE — Telephone Encounter (Signed)
lmtcb to schedule Aflutter ablation

## 2016-05-30 ENCOUNTER — Ambulatory Visit (INDEPENDENT_AMBULATORY_CARE_PROVIDER_SITE_OTHER): Payer: Medicare Other | Admitting: Vascular Surgery

## 2016-05-30 ENCOUNTER — Other Ambulatory Visit: Payer: Self-pay | Admitting: *Deleted

## 2016-05-30 ENCOUNTER — Encounter: Payer: Self-pay | Admitting: *Deleted

## 2016-05-30 ENCOUNTER — Encounter: Payer: Self-pay | Admitting: Vascular Surgery

## 2016-05-30 VITALS — BP 106/70 | HR 48 | Temp 97.7°F | Resp 18 | Ht 66.0 in | Wt 215.0 lb

## 2016-05-30 DIAGNOSIS — N186 End stage renal disease: Secondary | ICD-10-CM

## 2016-05-30 DIAGNOSIS — Z940 Kidney transplant status: Secondary | ICD-10-CM | POA: Diagnosis not present

## 2016-05-30 DIAGNOSIS — T8619 Other complication of kidney transplant: Principal | ICD-10-CM

## 2016-05-30 MED ORDER — PREDNISONE 50 MG PO TABS: ORAL_TABLET | ORAL | 0 refills | Status: DC

## 2016-05-30 NOTE — Progress Notes (Signed)
Established Dialysis Access  History of Present Illness  Patrick [name] is a 60 y.o. (25-Aug-1955) male who presents for re-evaluation for permanent access.  The patient is left hand dominant.  Previous access procedures have been completed in the both arms.  The patient's complication from previous access procedures include: thrombosis.  The patient has never had a previous PPM placed.  Past Medical History:  Diagnosis Date  . Diabetes mellitus without complication ([place])   . End-stage renal failure with renal transplant ([place])   . Gout   . Hypertension   . Impotence of organic origin   . Secondary hyperparathyroidism ([place])   . Type I (juvenile type) diabetes mellitus with renal manifestations, not stated as uncontrolled(250.41)     Past Surgical History:  Procedure Laterality Date  . APPENDECTOMY    . AV FISTULA PLACEMENT Right 09/18/2014   Procedure: INSERTION OF ARTERIOVENOUS (AV) GORE-TEX GRAFT ARM USING 4-7MM  X 45CM STRETCH GORE-TEX VASCULAR GRAFT;  Surgeon: Rosetta Posner, MD;  Location: [facility];  Service: Vascular;  Laterality: Right;  . birth defect heart surgery  1996  . IR GENERIC HISTORICAL  05/11/2016   IR FLUORO GUIDE CV LINE LEFT 05/11/2016 Corrie Mckusick, DO [facility]  . IR GENERIC HISTORICAL  05/11/2016   IR US GUIDE VASC ACCESS LEFT 05/11/2016 Corrie Mckusick, DO [facility]  . IR GENERIC HISTORICAL  05/11/2016   IR US GUIDE VASC ACCESS RIGHT 05/11/2016 Corrie Mckusick, DO [facility]  . IR GENERIC HISTORICAL  05/11/2016   IR RADIOLOGY PERIPHERAL GUIDED IV START 05/11/2016 Corrie Mckusick, DO [facility]  . KIDNEY TRANSPLANT  2009    Social History   Social History  . Marital status: Married    Spouse name: N/A  . Number of children: N/A  . Years of education: N/A   Occupational History  . Not on file.   Social History Main Topics  . Smoking status: Former Research scientist (life sciences)  . Smokeless tobacco: Never Used  . Alcohol use No  . Drug use: No  . Sexual activity: Not  Currently    Birth control/ protection: None   Other Topics Concern  . Not on file   Social History Narrative  . No narrative on file    Family History  Problem Relation Age of Onset  . Hyperlipidemia Mother   . Hypertension Mother     Current Outpatient Prescriptions  Medication Sig Dispense Refill  . amLODipine (NORVASC) 2.5 MG tablet Take 2.5 mg by mouth daily.    Marland Kitchen aspirin 81 MG tablet Take 81 mg by mouth daily.    Marland Kitchen atorvastatin (LIPITOR) 20 MG tablet Take 1 tablet (20 mg total) by mouth daily at 6 PM. 30 tablet 0  . calcium carbonate (OS-CAL - DOSED IN MG OF ELEMENTAL CALCIUM) 1250 (500 Ca) MG tablet Take 1 tablet by mouth daily with breakfast.    . [START ON 06/08/2016] colchicine 0.6 MG tablet Take 1 tablet (0.6 mg total) by mouth every 14 (fourteen) days.    Marland Kitchen ipratropium-albuterol (DUONEB) 0.5-2.5 (3) MG/3ML SOLN Take 3 mLs by nebulization every 4 (four) hours as needed. 360 mL   . isosorbide mononitrate (IMDUR) 30 MG 24 hr tablet Take 1 tablet (30 mg total) by mouth daily. 30 tablet 0  . metoprolol (LOPRESSOR) 100 MG tablet Take 1 tablet (100 mg total) by mouth 2 (two) times daily. 60 tablet 0  . repaglinide (PRANDIN) 0.5 MG tablet Take 1 tablet (0.5 mg total) by mouth 2 (  two) times daily before a meal. (Patient taking differently: Take 0.5 mg by mouth 2 (two) times daily. ) 60 tablet 11  . warfarin (COUMADIN) 7.5 MG tablet Take 1 tablet (7.5 mg total) by mouth daily at 6 PM. 7 tablet 0   No current facility-administered medications for this visit.      Allergies  Allergen Reactions  . Iodinated Diagnostic Agents Rash    Other Reaction: burning to mouth, swelling of lips.     REVIEW OF SYSTEMS:  (Positives checked otherwise negative)  CARDIOVASCULAR:   [ ]  chest pain,  [ ]  chest pressure,  [x]  palpitations,  [ ]  shortness of breath when laying flat,  [ ]  shortness of breath with exertion,   [ ]  pain in feet when walking,  [ ]  pain in feet when laying  flat, [ ]  history of blood clot in veins (DVT),  [ ]  history of phlebitis,  [ ]  swelling in legs,  [ ]  varicose veins  PULMONARY:   [ ]  productive cough,  [ ]  asthma,  [ ]  wheezing  NEUROLOGIC:   [ ]  weakness in arms or legs,  [ ]  numbness in arms or legs,  [ ]  difficulty speaking or slurred speech,  [ ]  temporary loss of vision in one eye,  [ ]  dizziness  HEMATOLOGIC:   [ ]  bleeding problems,  [ ]  problems with blood clotting too easily  MUSCULOSKEL:   [ ]  joint pain, [ ]  joint swelling  GASTROINTEST:   [ ]  vomiting blood,  [ ]  blood in stool     GENITOURINARY:   [ ]  burning with urination,  [ ]  blood in urine  PSYCHIATRIC:   [ ]  history of major depression  INTEGUMENTARY:   [ ]  rashes,  [ ]  ulcers  CONSTITUTIONAL:   [ ]  fever,  [ ]  chills      Physical Examination  Vitals:   05/30/16 0938  BP: 106/70  Pulse: (!) 48  Resp: 18  Temp: 97.7 F (36.5 C)  TempSrc: Oral  SpO2: 96%  Weight: 215 lb (97.5 kg)  Height: 5\' 6"  (1.676 m)   Body mass index is 34.7 kg/m.  General: A&O x 3, WD, WN  Pulmonary: Sym exp, good air movt, CTAB, no rales, rhonchi, & wheezing, LIJV TDC  Cardiac: RRR, Nl S1, S2, no Murmurs, rubs or gallops  Vascular: Vessel Right Left  Radial Palpable Palpable  Ulnar Not Palpable Not Palpable  Brachial Palpable Palpable   Gastrointestinal: soft, NTND, no G/R, bo HSM, no masses, no CVAT B  Musculoskeletal: M/S 5/5 throughout , Extremities without  ischemic changes , no palpable thrill in L FA AVG, RUA AVG,   Neurologic: Pain and light touch intact in extremities , Motor exam as listed above    Medical Decision Making  Patrick [name] is a 60 y.o. male who presents with ESRD requiring hemodialysis.    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 He currently has a LIJV TDC.  I would obtain bilateral arm and central venograms to determine patency of the arms and central veins to help determine if any residual  access options in the arms. Risk of extremity and central venography include but are not limited to: anaphylaxis, bleeding, infection, and incomplete visualization of the venous system.  The patient has agreed to proceed with the above procedure which will be scheduled 8 NOV 17.   Adele Barthel, MD, FACS [facility] [place] Office: [phone] Pager:  [phone]  

## 2016-05-30 NOTE — Addendum Note (Signed)
Addended by: Mena Goes on: 05/30/2016 10:55 AM   Modules accepted: Orders

## 2016-05-31 DIAGNOSIS — D631 Anemia in chronic kidney disease: Secondary | ICD-10-CM | POA: Diagnosis not present

## 2016-05-31 DIAGNOSIS — E875 Hyperkalemia: Secondary | ICD-10-CM | POA: Diagnosis not present

## 2016-05-31 DIAGNOSIS — Z23 Encounter for immunization: Secondary | ICD-10-CM | POA: Diagnosis not present

## 2016-05-31 DIAGNOSIS — N186 End stage renal disease: Secondary | ICD-10-CM | POA: Diagnosis not present

## 2016-05-31 DIAGNOSIS — E1029 Type 1 diabetes mellitus with other diabetic kidney complication: Secondary | ICD-10-CM | POA: Diagnosis not present

## 2016-05-31 DIAGNOSIS — D509 Iron deficiency anemia, unspecified: Secondary | ICD-10-CM | POA: Diagnosis not present

## 2016-06-03 ENCOUNTER — Encounter: Payer: Self-pay | Admitting: Nephrology

## 2016-06-03 DIAGNOSIS — D509 Iron deficiency anemia, unspecified: Secondary | ICD-10-CM | POA: Diagnosis not present

## 2016-06-03 DIAGNOSIS — N186 End stage renal disease: Secondary | ICD-10-CM | POA: Diagnosis not present

## 2016-06-03 DIAGNOSIS — E1029 Type 1 diabetes mellitus with other diabetic kidney complication: Secondary | ICD-10-CM | POA: Diagnosis not present

## 2016-06-03 DIAGNOSIS — Z23 Encounter for immunization: Secondary | ICD-10-CM | POA: Diagnosis not present

## 2016-06-03 DIAGNOSIS — E875 Hyperkalemia: Secondary | ICD-10-CM | POA: Diagnosis not present

## 2016-06-03 DIAGNOSIS — D631 Anemia in chronic kidney disease: Secondary | ICD-10-CM | POA: Diagnosis not present

## 2016-06-04 ENCOUNTER — Encounter (HOSPITAL_COMMUNITY): Payer: Self-pay | Admitting: Vascular Surgery

## 2016-06-04 ENCOUNTER — Encounter (HOSPITAL_COMMUNITY)
Admission: RE | Disposition: A | Discharge status: Home / Self Care | Payer: Self-pay | Source: Ambulatory Visit | Attending: Vascular Surgery

## 2016-06-04 ENCOUNTER — Ambulatory Visit (HOSPITAL_COMMUNITY)
Admission: RE | Admit: 2016-06-04 | Discharge: 2016-06-04 | Disposition: A | Discharge status: Home / Self Care | Payer: Medicare Other | Source: Ambulatory Visit | Attending: Vascular Surgery | Admitting: Vascular Surgery

## 2016-06-04 DIAGNOSIS — N186 End stage renal disease: Secondary | ICD-10-CM | POA: Diagnosis not present

## 2016-06-04 DIAGNOSIS — Z79899 Other long term (current) drug therapy: Secondary | ICD-10-CM | POA: Diagnosis not present

## 2016-06-04 DIAGNOSIS — N184 Chronic kidney disease, stage 4 (severe): Secondary | ICD-10-CM | POA: Diagnosis not present

## 2016-06-04 DIAGNOSIS — Z7901 Long term (current) use of anticoagulants: Secondary | ICD-10-CM | POA: Insufficient documentation

## 2016-06-04 HISTORY — PX: PERIPHERAL VASCULAR CATHETERIZATION: SHX172C

## 2016-06-04 LAB — POCT I-STAT, CHEM 8
BUN: 52 mg/dL — ABNORMAL HIGH (ref 6–20)
CALCIUM ION: 0.86 mmol/L — AB (ref 1.15–1.40)
CREATININE: 12.8 mg/dL — AB (ref 0.61–1.24)
Chloride: 106 mmol/L (ref 101–111)
GLUCOSE: 131 mg/dL — AB (ref 65–99)
HCT: 39 % (ref 39.0–52.0)
HEMOGLOBIN: 13.3 g/dL (ref 13.0–17.0)
Potassium: 6.6 mmol/L (ref 3.5–5.1)
Sodium: 136 mmol/L (ref 135–145)
TCO2: 19 mmol/L (ref 0–100)

## 2016-06-04 LAB — GLUCOSE, CAPILLARY: Glucose-Capillary: 116 mg/dL — ABNORMAL HIGH (ref 65–99)

## 2016-06-04 LAB — BASIC METABOLIC PANEL
ANION GAP: 14 (ref 5–15)
BUN: 54 mg/dL — ABNORMAL HIGH (ref 6–20)
CALCIUM: 7.8 mg/dL — AB (ref 8.9–10.3)
CO2: 18 mmol/L — ABNORMAL LOW (ref 22–32)
CREATININE: 12.4 mg/dL — AB (ref 0.61–1.24)
Chloride: 105 mmol/L (ref 101–111)
GFR calc Af Amer: 4 mL/min — ABNORMAL LOW (ref >60)
GFR, EST NON AFRICAN AMERICAN: 4 mL/min — AB (ref >60)
GLUCOSE: 135 mg/dL — AB (ref 65–99)
Potassium: 6.8 mmol/L (ref 3.5–5.1)
Sodium: 137 mmol/L (ref 135–145)

## 2016-06-04 LAB — PROTIME-INR
INR: 2.09
PROTHROMBIN TIME: 23.8 s — AB (ref 11.4–15.2)

## 2016-06-04 SURGERY — UPPER EXTREMITY VENOGRAPHY
Anesthesia: LOCAL

## 2016-06-04 MED ORDER — HEPARIN (PORCINE) IN NACL 2-0.9 UNIT/ML-% IJ SOLN
INTRAMUSCULAR | Status: AC
  Filled 2016-06-04: qty 500

## 2016-06-04 MED ORDER — SODIUM CHLORIDE 0.9% FLUSH: 3.0000 mL | Freq: Two times a day (BID) | INTRAVENOUS | Status: DC

## 2016-06-04 MED ORDER — FENTANYL CITRATE (PF) 100 MCG/2ML IJ SOLN
INTRAMUSCULAR | Status: AC
  Filled 2016-06-04: qty 2

## 2016-06-04 MED ORDER — FENTANYL CITRATE (PF) 100 MCG/2ML IJ SOLN
INTRAMUSCULAR | Status: DC | PRN
  Administered 2016-06-04: 50 ug via INTRAVENOUS

## 2016-06-04 MED ORDER — LIDOCAINE HCL (PF) 1 % IJ SOLN
INTRAMUSCULAR | Status: DC | PRN
  Administered 2016-06-04: 20 mL

## 2016-06-04 MED ORDER — SODIUM CHLORIDE 0.9% FLUSH: 3.0000 mL | INTRAVENOUS | Status: DC | PRN

## 2016-06-04 MED ORDER — SODIUM CHLORIDE 0.9 % IV SOLN
250.0000 mL | INTRAVENOUS | Status: DC | PRN
Start: 2016-06-04 — End: 2016-06-04

## 2016-06-04 MED ORDER — HEPARIN (PORCINE) IN NACL 2-0.9 UNIT/ML-% IJ SOLN
INTRAMUSCULAR | Status: DC | PRN
  Administered 2016-06-04: 500 mL

## 2016-06-04 MED ORDER — LIDOCAINE HCL (PF) 1 % IJ SOLN
INTRAMUSCULAR | Status: AC
  Filled 2016-06-04: qty 30

## 2016-06-04 SURGICAL SUPPLY — 13 items
BAG SNAP BAND KOVER 36X36 (MISCELLANEOUS) ×2 IMPLANT
CATH ANGIO 5F BER2 100CM (CATHETERS) ×2 IMPLANT
COVER DOME SNAP 22 D (MISCELLANEOUS) ×2 IMPLANT
COVER PRB 48X5XTLSCP FOLD TPE (BAG) ×1 IMPLANT
COVER PROBE 5X48 (BAG) ×1
KIT PV (KITS) ×2 IMPLANT
PROTECTION STATION PRESSURIZED (MISCELLANEOUS) ×2
SHEATH PINNACLE 5F 10CM (SHEATH) ×2 IMPLANT
STATION PROTECTION PRESSURIZED (MISCELLANEOUS) ×1 IMPLANT
SYR MEDRAD MARK V 150ML (SYRINGE) IMPLANT
TRANSDUCER W/STOPCOCK (MISCELLANEOUS) ×2 IMPLANT
TRAY PV CATH (CUSTOM PROCEDURE TRAY) ×2 IMPLANT
WIRE BENTSON .035X145CM (WIRE) ×2 IMPLANT

## 2016-06-04 NOTE — Discharge Instructions (Signed)
Venogram, Care After °Refer to this sheet in the next few weeks. These instructions provide you with information on caring for yourself after your procedure. Your health care provider may also give you more specific instructions. Your treatment has been planned according to current medical practices, but problems sometimes occur. Call your health care provider if you have any problems or questions after your procedure. °WHAT TO EXPECT AFTER THE PROCEDURE °After your procedure, it is typical to have the following sensations: °· Mild discomfort at the catheter insertion site. °HOME CARE INSTRUCTIONS  °· Take all medicines exactly as directed. °· Follow any prescribed diet. °· Follow instructions regarding both rest and physical activity. °· Drink more fluids for the first several days after the procedure in order to help flush dye from your kidneys. °SEEK MEDICAL CARE IF: °· You develop a rash. °· You have fever not controlled by medicine. °SEEK IMMEDIATE MEDICAL CARE IF: °· There is pain, drainage, bleeding, redness, swelling, warmth or a red streak at the site of the IV tube. °· The extremity where your IV tube was placed becomes discolored, numb, or cool. °· You have difficulty breathing or shortness of breath. °· You develop chest pain. °· You have excessive dizziness or fainting. °  °This information is not intended to replace advice given to you by your health care provider. Make sure you discuss any questions you have with your health care provider. °  °Document Released: 05/04/2013 Document Revised: 07/19/2013 Document Reviewed: 05/04/2013 °Elsevier Interactive Patient Education ©2016 Elsevier Inc. ° °

## 2016-06-04 NOTE — H&P (Signed)
     History and Physical Update  The patient was interviewed and re-examined.  The patient's previous History and Physical has been reviewed and is unchanged from Dr. Bridgett Larsson office visit. Upper extremity venography with possible groin access for central venogram.  Elison Worrel C. Donzetta Matters, MD [facility] [place] Office: [phone] Pager: [phone]   06/04/2016, 7:54 AM

## 2016-06-04 NOTE — Progress Notes (Signed)
Pt given more food and drink, still waiting for transportation home.

## 2016-06-04 NOTE — Progress Notes (Signed)
Dr. Irven Shelling office notified that pt has no more Coumadin pills. Prescription called to pharmacy and pt instructed to continue and return to office Friday for INR.  Pt understands all instructions.

## 2016-06-04 NOTE — Progress Notes (Addendum)
Critical Value Report:  K+ 6.8 slight hemolysis verified with [place]  Critical value K+6.8 slight hemolysis reported to [place] in cath lab

## 2016-06-04 NOTE — Op Note (Signed)
    Patient name: Patrick [name] MRN: [id number] DOB: [birth year] Sex: male  06/04/2016 Pre-operative Diagnosis: esrd Post-operative diagnosis:  Same Surgeon:  Erlene Quan C. Donzetta Matters, MD Procedure Performed: 1.  Left upper extremity venogram 2.  US guided cannulation right common femoral vein 3.  Central venogram   Indications:  60 year old male with end-stage renal disease history of bilateral upper extremity grafts. He is currently dialyzing via a tunneled catheter and is in need of permanent access. He presents today for central venography for evaluation of possible left upper extremity AV graf  Findings: There appears to be suitable central access as well as left axillary vein for venous outflow. He also has a palpable brachial pulse above the antecubitum and is likely a candidate for a left brachial to axillary AV graft.   Procedure:  The patient was identified in the holding area and taken to room 8.  The patient was then placed supine on the table in timeout called. Existing IV in his left shoulder was cannulated and left upper extremity venogram was performed. Unfortunately from this we gathered very little information. He was then prepped and draped in his bilateral groins. The right common femoral vein was identified under ultrasound guidance and noted to be patent. Lidocaine with epinephrine was instilled in the right groin and the vein was cannulated with 18-gauge needle. Benson wire was placed and we exchanged for 5 [place] sheath. Using bare catheter and Bentson wire we then selected his left innominate subclavian and axillary veins and central venography was performed. This was noted to be patent out to the level of the arm. With this catheter wire removed sheath was pulled and pressure held until hemostasis obtained. The patient tolerated this procedure well without immediate complications.   Welcome Fults C. Donzetta Matters, MD [facility] [place] Office: [phone] Pager:  [phone]

## 2016-06-07 DIAGNOSIS — Z23 Encounter for immunization: Secondary | ICD-10-CM | POA: Diagnosis not present

## 2016-06-07 DIAGNOSIS — E875 Hyperkalemia: Secondary | ICD-10-CM | POA: Diagnosis not present

## 2016-06-07 DIAGNOSIS — N186 End stage renal disease: Secondary | ICD-10-CM | POA: Diagnosis not present

## 2016-06-07 DIAGNOSIS — I4891 Unspecified atrial fibrillation: Secondary | ICD-10-CM | POA: Diagnosis not present

## 2016-06-07 DIAGNOSIS — E1029 Type 1 diabetes mellitus with other diabetic kidney complication: Secondary | ICD-10-CM | POA: Diagnosis not present

## 2016-06-07 DIAGNOSIS — D509 Iron deficiency anemia, unspecified: Secondary | ICD-10-CM | POA: Diagnosis not present

## 2016-06-07 DIAGNOSIS — D631 Anemia in chronic kidney disease: Secondary | ICD-10-CM | POA: Diagnosis not present

## 2016-06-10 ENCOUNTER — Other Ambulatory Visit: Payer: Self-pay

## 2016-06-10 DIAGNOSIS — D631 Anemia in chronic kidney disease: Secondary | ICD-10-CM | POA: Diagnosis not present

## 2016-06-10 DIAGNOSIS — Z23 Encounter for immunization: Secondary | ICD-10-CM | POA: Diagnosis not present

## 2016-06-10 DIAGNOSIS — N186 End stage renal disease: Secondary | ICD-10-CM | POA: Diagnosis not present

## 2016-06-10 DIAGNOSIS — E875 Hyperkalemia: Secondary | ICD-10-CM | POA: Diagnosis not present

## 2016-06-10 DIAGNOSIS — D509 Iron deficiency anemia, unspecified: Secondary | ICD-10-CM | POA: Diagnosis not present

## 2016-06-10 DIAGNOSIS — E1029 Type 1 diabetes mellitus with other diabetic kidney complication: Secondary | ICD-10-CM | POA: Diagnosis not present

## 2016-06-12 DIAGNOSIS — E875 Hyperkalemia: Secondary | ICD-10-CM | POA: Diagnosis not present

## 2016-06-12 DIAGNOSIS — Z23 Encounter for immunization: Secondary | ICD-10-CM | POA: Diagnosis not present

## 2016-06-12 DIAGNOSIS — N186 End stage renal disease: Secondary | ICD-10-CM | POA: Diagnosis not present

## 2016-06-12 DIAGNOSIS — D509 Iron deficiency anemia, unspecified: Secondary | ICD-10-CM | POA: Diagnosis not present

## 2016-06-12 DIAGNOSIS — E1029 Type 1 diabetes mellitus with other diabetic kidney complication: Secondary | ICD-10-CM | POA: Diagnosis not present

## 2016-06-12 DIAGNOSIS — D631 Anemia in chronic kidney disease: Secondary | ICD-10-CM | POA: Diagnosis not present

## 2016-06-14 DIAGNOSIS — E1029 Type 1 diabetes mellitus with other diabetic kidney complication: Secondary | ICD-10-CM | POA: Diagnosis not present

## 2016-06-14 DIAGNOSIS — D631 Anemia in chronic kidney disease: Secondary | ICD-10-CM | POA: Diagnosis not present

## 2016-06-14 DIAGNOSIS — D509 Iron deficiency anemia, unspecified: Secondary | ICD-10-CM | POA: Diagnosis not present

## 2016-06-14 DIAGNOSIS — E875 Hyperkalemia: Secondary | ICD-10-CM | POA: Diagnosis not present

## 2016-06-14 DIAGNOSIS — Z23 Encounter for immunization: Secondary | ICD-10-CM | POA: Diagnosis not present

## 2016-06-14 DIAGNOSIS — N186 End stage renal disease: Secondary | ICD-10-CM | POA: Diagnosis not present

## 2016-06-15 DIAGNOSIS — E1029 Type 1 diabetes mellitus with other diabetic kidney complication: Secondary | ICD-10-CM | POA: Diagnosis not present

## 2016-06-15 DIAGNOSIS — E875 Hyperkalemia: Secondary | ICD-10-CM | POA: Diagnosis not present

## 2016-06-15 DIAGNOSIS — Z23 Encounter for immunization: Secondary | ICD-10-CM | POA: Diagnosis not present

## 2016-06-15 DIAGNOSIS — D509 Iron deficiency anemia, unspecified: Secondary | ICD-10-CM | POA: Diagnosis not present

## 2016-06-15 DIAGNOSIS — N186 End stage renal disease: Secondary | ICD-10-CM | POA: Diagnosis not present

## 2016-06-15 DIAGNOSIS — D631 Anemia in chronic kidney disease: Secondary | ICD-10-CM | POA: Diagnosis not present

## 2016-06-16 ENCOUNTER — Encounter: Payer: Self-pay | Admitting: Vascular Surgery

## 2016-06-17 ENCOUNTER — Encounter: Payer: Self-pay | Admitting: Cardiology

## 2016-06-17 DIAGNOSIS — I4892 Unspecified atrial flutter: Secondary | ICD-10-CM | POA: Diagnosis not present

## 2016-06-17 DIAGNOSIS — Z951 Presence of aortocoronary bypass graft: Secondary | ICD-10-CM | POA: Diagnosis not present

## 2016-06-17 DIAGNOSIS — E108 Type 1 diabetes mellitus with unspecified complications: Secondary | ICD-10-CM | POA: Diagnosis not present

## 2016-06-17 DIAGNOSIS — Q245 Malformation of coronary vessels: Secondary | ICD-10-CM | POA: Diagnosis not present

## 2016-06-18 DIAGNOSIS — Z23 Encounter for immunization: Secondary | ICD-10-CM | POA: Diagnosis not present

## 2016-06-18 DIAGNOSIS — E1029 Type 1 diabetes mellitus with other diabetic kidney complication: Secondary | ICD-10-CM | POA: Diagnosis not present

## 2016-06-18 DIAGNOSIS — E875 Hyperkalemia: Secondary | ICD-10-CM | POA: Diagnosis not present

## 2016-06-18 DIAGNOSIS — N186 End stage renal disease: Secondary | ICD-10-CM | POA: Diagnosis not present

## 2016-06-18 DIAGNOSIS — D631 Anemia in chronic kidney disease: Secondary | ICD-10-CM | POA: Diagnosis not present

## 2016-06-18 DIAGNOSIS — D509 Iron deficiency anemia, unspecified: Secondary | ICD-10-CM | POA: Diagnosis not present

## 2016-06-21 DIAGNOSIS — D631 Anemia in chronic kidney disease: Secondary | ICD-10-CM | POA: Diagnosis not present

## 2016-06-21 DIAGNOSIS — N186 End stage renal disease: Secondary | ICD-10-CM | POA: Diagnosis not present

## 2016-06-21 DIAGNOSIS — D509 Iron deficiency anemia, unspecified: Secondary | ICD-10-CM | POA: Diagnosis not present

## 2016-06-21 DIAGNOSIS — E875 Hyperkalemia: Secondary | ICD-10-CM | POA: Diagnosis not present

## 2016-06-21 DIAGNOSIS — Z23 Encounter for immunization: Secondary | ICD-10-CM | POA: Diagnosis not present

## 2016-06-21 DIAGNOSIS — E1029 Type 1 diabetes mellitus with other diabetic kidney complication: Secondary | ICD-10-CM | POA: Diagnosis not present

## 2016-06-22 NOTE — Progress Notes (Deleted)
   Subjective:    Patient ID: Patrick [name], male    DOB: [birth year], 60 y.o.   MRN: [id number]  HPI Pt returns for f/u of diabetes mellitus: DM type: 2 Dx'ed: 2014, when he presented with severe hyperglycemia.    Complications: polyneuropathy and renal failure.  Therapy: repaglinide.  DKA: never. Severe hypoglycemia: never. Pancreatitis: never. Other: he took insulin 2014-2016; he had renal transplant in 2009, but renal failure has recurred.  He is back on dialysis. Interval history: pt states he feels well in general. no cbg record, but states cbg's are well-controlled.     Review of Systems     Objective:   Physical Exam VITAL SIGNS:  See vs page GENERAL: no distress Pulses: dorsalis pedis intact bilat.   MSK: no deformity of the feet CV: no leg edema Skin:  no ulcer on the feet.  normal color and temp on the feet. Neuro: sensation is intact to touch on the feet      Assessment & Plan:  Type 2 DM, with polyneuropathy: overcontrolled Renal failure: he is at risk for hypoglycemia, so a goal a1c would be in the 7's

## 2016-06-24 DIAGNOSIS — E875 Hyperkalemia: Secondary | ICD-10-CM | POA: Diagnosis not present

## 2016-06-24 DIAGNOSIS — D509 Iron deficiency anemia, unspecified: Secondary | ICD-10-CM | POA: Diagnosis not present

## 2016-06-24 DIAGNOSIS — D631 Anemia in chronic kidney disease: Secondary | ICD-10-CM | POA: Diagnosis not present

## 2016-06-24 DIAGNOSIS — N186 End stage renal disease: Secondary | ICD-10-CM | POA: Diagnosis not present

## 2016-06-24 DIAGNOSIS — E1029 Type 1 diabetes mellitus with other diabetic kidney complication: Secondary | ICD-10-CM | POA: Diagnosis not present

## 2016-06-24 DIAGNOSIS — Z23 Encounter for immunization: Secondary | ICD-10-CM | POA: Diagnosis not present

## 2016-06-25 ENCOUNTER — Ambulatory Visit: Payer: Medicare Other | Admitting: Endocrinology

## 2016-06-25 DIAGNOSIS — Z0289 Encounter for other administrative examinations: Secondary | ICD-10-CM

## 2016-06-26 DIAGNOSIS — Z23 Encounter for immunization: Secondary | ICD-10-CM | POA: Diagnosis not present

## 2016-06-26 DIAGNOSIS — Z992 Dependence on renal dialysis: Secondary | ICD-10-CM | POA: Diagnosis not present

## 2016-06-26 DIAGNOSIS — D631 Anemia in chronic kidney disease: Secondary | ICD-10-CM | POA: Diagnosis not present

## 2016-06-26 DIAGNOSIS — D509 Iron deficiency anemia, unspecified: Secondary | ICD-10-CM | POA: Diagnosis not present

## 2016-06-26 DIAGNOSIS — N186 End stage renal disease: Secondary | ICD-10-CM | POA: Diagnosis not present

## 2016-06-26 DIAGNOSIS — T8610 Unspecified complication of kidney transplant: Secondary | ICD-10-CM | POA: Diagnosis not present

## 2016-06-26 DIAGNOSIS — E1029 Type 1 diabetes mellitus with other diabetic kidney complication: Secondary | ICD-10-CM | POA: Diagnosis not present

## 2016-06-26 DIAGNOSIS — E875 Hyperkalemia: Secondary | ICD-10-CM | POA: Diagnosis not present

## 2016-06-27 DIAGNOSIS — Z7901 Long term (current) use of anticoagulants: Secondary | ICD-10-CM | POA: Diagnosis not present

## 2016-06-28 DIAGNOSIS — E1029 Type 1 diabetes mellitus with other diabetic kidney complication: Secondary | ICD-10-CM | POA: Diagnosis not present

## 2016-06-28 DIAGNOSIS — N186 End stage renal disease: Secondary | ICD-10-CM | POA: Diagnosis not present

## 2016-06-28 DIAGNOSIS — D631 Anemia in chronic kidney disease: Secondary | ICD-10-CM | POA: Diagnosis not present

## 2016-07-01 DIAGNOSIS — D631 Anemia in chronic kidney disease: Secondary | ICD-10-CM | POA: Diagnosis not present

## 2016-07-01 DIAGNOSIS — E1029 Type 1 diabetes mellitus with other diabetic kidney complication: Secondary | ICD-10-CM | POA: Diagnosis not present

## 2016-07-01 DIAGNOSIS — N186 End stage renal disease: Secondary | ICD-10-CM | POA: Diagnosis not present

## 2016-07-02 ENCOUNTER — Encounter: Payer: Self-pay | Admitting: Internal Medicine

## 2016-07-03 DIAGNOSIS — N186 End stage renal disease: Secondary | ICD-10-CM | POA: Diagnosis not present

## 2016-07-03 DIAGNOSIS — D631 Anemia in chronic kidney disease: Secondary | ICD-10-CM | POA: Diagnosis not present

## 2016-07-03 DIAGNOSIS — E1029 Type 1 diabetes mellitus with other diabetic kidney complication: Secondary | ICD-10-CM | POA: Diagnosis not present

## 2016-07-03 NOTE — Telephone Encounter (Signed)
Patient would like to discuss ablation more before scheduling procedure.  He is having AV fistula graft placed next week and would like to see physician week after next - pt scheduled for consult on 12/20 w/ Dr. Curt Bears. He appreciates the follow up.  Will schedule procedure when he sees Dr. Curt Bears in a few weeks.

## 2016-07-04 ENCOUNTER — Encounter (HOSPITAL_COMMUNITY): Payer: Self-pay

## 2016-07-04 NOTE — Progress Notes (Signed)
Anesthesia Chart Review:  Pt is a same day work up.   Pt is a 60 year old male scheduled for insertion of brachial to axillary AV gore-tex graft L arm on 07/07/2016 with Adele Barthel, MD.   - Cardiologist is Clayton Lefort, MD who has cleared pt for surgery  PMH includes:  MI, atrial flutter, HTN, DM, ESRD on dialysis, secondary hyperparathyroidism. Former smoker. BMI 35  - Pt hospitalized 10/29-10/30/17 for new onset afib/flutter. Started on coumadin. Saw Ganji while inpatient.   Medications include: albuterol, amlodipine, carvedilol, imdur, prandin, coumadin. Last dose coumadin (07/01/16).   Labs will be obtained DOS.   CXR 05/25/16: No evidence of acute cardiopulmonary disease.  EKG 05/26/16: Atrial flutter with 2:1 A-V conduction. ST & T wave abnormality, consider inferolateral ischemia. Prolonged QT  Echo 01/25/16:  - Left ventricle: There was mild concentric hypertrophy. Systolic function was mildly reduced. The estimated ejection fraction was in the range of 45% to 50%. Diffuse hypokinesis. Features are consistent with a pseudonormal left ventricular filling pattern, with concomitant abnormal relaxation and increased filling pressure (grade 2 diastolic dysfunction). Doppler parameters are consistent with both elevated ventricular end-diastolic filling pressure and elevated left atrial filling pressure. - Aortic valve: Normal-sized, mildly calcified annulus. Trileaflet. Moderate calcification involving the left coronary and noncoronary cusp. There was very mild stenosis. Mean gradient (S): 13 mm Hg. Peak gradient (S): 26 mm Hg. Valve area (VTI): 1.02 cm^2. Valve area (Vmax): 0.86 cm^2. Valve area (Vmean): 0.9 cm^2. - Mitral valve: Calcified annulus. There was mild regurgitation. Valve area by continuity equation (using LVOT flow): 1.35 cm^2. - Left atrium: The atrium was mildly dilated. - Atrial septum: No defect or patent foramen ovale was identified.  By notes, pt had stress test at Dr.  Irven Shelling office 01/08/15.   If labs acceptable DOS, I anticipate pt can proceed as scheduled.   Willeen Cass, FNP-BC [facility]/Anesthesiology Phone: [phone] 07/04/2016 3:43 PM

## 2016-07-04 NOTE — Progress Notes (Signed)
Call to [facility], left voicemail for [facility] that pt. Is unsure of getting a ride home on Monday, post op.

## 2016-07-04 NOTE — Progress Notes (Signed)
Due to "flutter", pt. Reports Dr. Einar Gip is sending him to another cardio on 12/20 for consult regarding how to treat flutter.

## 2016-07-04 NOTE — Progress Notes (Signed)
CAll to A. Kabbe,NP, reported that pt. Has a-flutter & is being referred to Dr. At [facility] by Dr. Einar Gip. Will request more record from Dr. Einar Gip.  Also mentioned that pt. Is going to "work on" arranging someone to escort him home on Monday. Pt. Is informed that he will have to have someone with him, that he will not be d/c'd alone on the [facility] bus.

## 2016-07-04 NOTE — Progress Notes (Signed)
Faxing request to Dr. Irven Shelling office to send LOV note

## 2016-07-05 DIAGNOSIS — E1029 Type 1 diabetes mellitus with other diabetic kidney complication: Secondary | ICD-10-CM | POA: Diagnosis not present

## 2016-07-05 DIAGNOSIS — N186 End stage renal disease: Secondary | ICD-10-CM | POA: Diagnosis not present

## 2016-07-05 DIAGNOSIS — D631 Anemia in chronic kidney disease: Secondary | ICD-10-CM | POA: Diagnosis not present

## 2016-07-06 ENCOUNTER — Encounter (HOSPITAL_COMMUNITY): Payer: Self-pay | Admitting: Anesthesiology

## 2016-07-06 NOTE — Anesthesia Preprocedure Evaluation (Addendum)
Anesthesia Evaluation  Patient identified by MRN, date of birth, ID band  Reviewed: Allergy & Precautions, NPO status , Patient's Chart, lab work & pertinent test results, reviewed documented beta blocker date and time   Airway Mallampati: II  TM Distance: >3 FB Neck ROM: Full    Dental  (+) Teeth Intact, Dental Advisory Given   Pulmonary former smoker,    breath sounds clear to auscultation       Cardiovascular hypertension, Pt. on medications and Pt. on home beta blockers + CAD, + Past MI and +CHF  + dysrhythmias Atrial Fibrillation  Rhythm:Regular Rate:Normal     Neuro/Psych negative neurological ROS  negative psych ROS   GI/Hepatic negative GI ROS, Neg liver ROS,   Endo/Other  diabetes, Type 2  Renal/GU ESRF and DialysisRenal disease  negative genitourinary   Musculoskeletal  (+) Arthritis , Osteoarthritis,    Abdominal   Peds negative pediatric ROS (+)  Hematology negative hematology ROS (+)   Anesthesia Other Findings   Reproductive/Obstetrics negative OB ROS                            Lab Results  Component Value Date   WBC 5.7 05/26/2016   HGB 13.3 06/04/2016   HCT 39.0 06/04/2016   MCV 95.5 05/26/2016   PLT 123 (L) 05/26/2016   Lab Results  Component Value Date   CREATININE 12.40 (H) 06/04/2016   CREATININE 12.80 (H) 06/04/2016   BUN 54 (H) 06/04/2016   BUN 52 (H) 06/04/2016   NA 137 06/04/2016   NA 136 06/04/2016   K 6.8 (HH) 06/04/2016   K 6.6 (HH) 06/04/2016   CL 105 06/04/2016   CL 106 06/04/2016   CO2 18 (L) 06/04/2016   Lab Results  Component Value Date   INR 2.09 06/04/2016   INR 1.19 11/17/2015   INR 1.22 09/15/2014   04/2016 EKG: A. Flutter  Echo - Left ventricle: There was mild concentric hypertrophy. Systolic   function was mildly reduced. The estimated ejection fraction was   in the range of 45% to 50%. Diffuse hypokinesis. Features are  consistent with a pseudonormal left ventricular filling pattern,   with concomitant abnormal relaxation and increased filling   pressure (grade 2 diastolic dysfunction). Doppler parameters are   consistent with both elevated ventricular end-diastolic filling   pressure and elevated left atrial filling pressure. - Aortic valve: Normal-sized, mildly calcified annulus. Trileaflet.   Moderate calcification involving the left coronary and   noncoronary cusp. There was very mild stenosis. Mean gradient   (S): 13 mm Hg. Peak gradient (S): 26 mm Hg. Valve area (VTI):   1.02 cm^2. Valve area (Vmax): 0.86 cm^2. Valve area (Vmean): 0.9   cm^2. - Mitral valve: Calcified annulus. There was mild regurgitation.   Valve area by continuity equation (using LVOT flow): 1.35 cm^2. - Left atrium: The atrium was mildly dilated. - Atrial septum: No defect or patent foramen ovale was identified.  Anesthesia Physical Anesthesia Plan  ASA: III  Anesthesia Plan: General   Post-op Pain Management:    Induction: Intravenous  Airway Management Planned: Oral ETT  Additional Equipment:   Intra-op Plan:   Post-operative Plan: Extubation in OR  Informed Consent: I have reviewed the patients History and Physical, chart, labs and discussed the procedure including the risks, benefits and alternatives for the proposed anesthesia with the patient or authorized representative who has indicated his/her understanding and acceptance.   Dental  advisory given  Plan Discussed with: CRNA  Anesthesia Plan Comments:        Anesthesia Quick Evaluation

## 2016-07-07 ENCOUNTER — Ambulatory Visit (HOSPITAL_COMMUNITY): Payer: Medicare Other | Admitting: Emergency Medicine

## 2016-07-07 ENCOUNTER — Ambulatory Visit (HOSPITAL_COMMUNITY)
Admission: RE | Admit: 2016-07-07 | Discharge: 2016-07-07 | Disposition: A | Discharge status: Home / Self Care | Payer: Medicare Other | Source: Ambulatory Visit | Attending: Vascular Surgery | Admitting: Vascular Surgery

## 2016-07-07 ENCOUNTER — Encounter (HOSPITAL_COMMUNITY)
Admission: RE | Disposition: A | Discharge status: Home / Self Care | Payer: Self-pay | Source: Ambulatory Visit | Attending: Vascular Surgery

## 2016-07-07 DIAGNOSIS — M1990 Unspecified osteoarthritis, unspecified site: Secondary | ICD-10-CM | POA: Diagnosis not present

## 2016-07-07 DIAGNOSIS — Z888 Allergy status to other drugs, medicaments and biological substances status: Secondary | ICD-10-CM | POA: Diagnosis not present

## 2016-07-07 DIAGNOSIS — I4581 Long QT syndrome: Secondary | ICD-10-CM | POA: Insufficient documentation

## 2016-07-07 DIAGNOSIS — I509 Heart failure, unspecified: Secondary | ICD-10-CM | POA: Diagnosis not present

## 2016-07-07 DIAGNOSIS — Z8249 Family history of ischemic heart disease and other diseases of the circulatory system: Secondary | ICD-10-CM | POA: Insufficient documentation

## 2016-07-07 DIAGNOSIS — N185 Chronic kidney disease, stage 5: Secondary | ICD-10-CM | POA: Diagnosis not present

## 2016-07-07 DIAGNOSIS — Z992 Dependence on renal dialysis: Secondary | ICD-10-CM | POA: Insufficient documentation

## 2016-07-07 DIAGNOSIS — I252 Old myocardial infarction: Secondary | ICD-10-CM | POA: Insufficient documentation

## 2016-07-07 DIAGNOSIS — I080 Rheumatic disorders of both mitral and aortic valves: Secondary | ICD-10-CM | POA: Insufficient documentation

## 2016-07-07 DIAGNOSIS — Z91041 Radiographic dye allergy status: Secondary | ICD-10-CM | POA: Insufficient documentation

## 2016-07-07 DIAGNOSIS — I4891 Unspecified atrial fibrillation: Secondary | ICD-10-CM | POA: Insufficient documentation

## 2016-07-07 DIAGNOSIS — Z7901 Long term (current) use of anticoagulants: Secondary | ICD-10-CM | POA: Diagnosis not present

## 2016-07-07 DIAGNOSIS — Z87891 Personal history of nicotine dependence: Secondary | ICD-10-CM | POA: Diagnosis not present

## 2016-07-07 DIAGNOSIS — Z940 Kidney transplant status: Secondary | ICD-10-CM | POA: Insufficient documentation

## 2016-07-07 DIAGNOSIS — N2581 Secondary hyperparathyroidism of renal origin: Secondary | ICD-10-CM | POA: Insufficient documentation

## 2016-07-07 DIAGNOSIS — Z87442 Personal history of urinary calculi: Secondary | ICD-10-CM | POA: Insufficient documentation

## 2016-07-07 DIAGNOSIS — I2510 Atherosclerotic heart disease of native coronary artery without angina pectoris: Secondary | ICD-10-CM | POA: Diagnosis not present

## 2016-07-07 DIAGNOSIS — I132 Hypertensive heart and chronic kidney disease with heart failure and with stage 5 chronic kidney disease, or end stage renal disease: Secondary | ICD-10-CM | POA: Diagnosis not present

## 2016-07-07 DIAGNOSIS — E1122 Type 2 diabetes mellitus with diabetic chronic kidney disease: Secondary | ICD-10-CM | POA: Insufficient documentation

## 2016-07-07 DIAGNOSIS — N186 End stage renal disease: Secondary | ICD-10-CM | POA: Diagnosis not present

## 2016-07-07 DIAGNOSIS — M109 Gout, unspecified: Secondary | ICD-10-CM | POA: Diagnosis not present

## 2016-07-07 DIAGNOSIS — I4892 Unspecified atrial flutter: Secondary | ICD-10-CM | POA: Insufficient documentation

## 2016-07-07 DIAGNOSIS — I120 Hypertensive chronic kidney disease with stage 5 chronic kidney disease or end stage renal disease: Secondary | ICD-10-CM | POA: Diagnosis not present

## 2016-07-07 DIAGNOSIS — E211 Secondary hyperparathyroidism, not elsewhere classified: Secondary | ICD-10-CM | POA: Diagnosis not present

## 2016-07-07 HISTORY — DX: Personal history of other medical treatment: Z92.89

## 2016-07-07 HISTORY — DX: Personal history of urinary calculi: Z87.442

## 2016-07-07 HISTORY — PX: AV FISTULA PLACEMENT: SHX1204

## 2016-07-07 HISTORY — DX: Acute myocardial infarction, unspecified: I21.9

## 2016-07-07 HISTORY — DX: Unspecified osteoarthritis, unspecified site: M19.90

## 2016-07-07 LAB — GLUCOSE, CAPILLARY
GLUCOSE-CAPILLARY: 106 mg/dL — AB (ref 65–99)
Glucose-Capillary: 91 mg/dL (ref 65–99)

## 2016-07-07 LAB — PROTIME-INR
INR: 1.3
Prothrombin Time: 16.3 seconds — ABNORMAL HIGH (ref 11.4–15.2)

## 2016-07-07 LAB — POCT I-STAT 4, (NA,K, GLUC, HGB,HCT)
Glucose, Bld: 92 mg/dL (ref 65–99)
HEMATOCRIT: 30 % — AB (ref 39.0–52.0)
Hemoglobin: 10.2 g/dL — ABNORMAL LOW (ref 13.0–17.0)
POTASSIUM: 5.7 mmol/L — AB (ref 3.5–5.1)
SODIUM: 136 mmol/L (ref 135–145)

## 2016-07-07 LAB — APTT: APTT: 34 s (ref 24–36)

## 2016-07-07 SURGERY — INSERTION OF ARTERIOVENOUS (AV) GORE-TEX GRAFT ARM
Anesthesia: General | Site: Arm Upper | Laterality: Left

## 2016-07-07 MED ORDER — FENTANYL CITRATE (PF) 100 MCG/2ML IJ SOLN: 25.0000 ug | INTRAMUSCULAR | Status: DC | PRN

## 2016-07-07 MED ORDER — LIDOCAINE 2% (20 MG/ML) 5 ML SYRINGE
INTRAMUSCULAR | Status: AC
  Filled 2016-07-07: qty 5

## 2016-07-07 MED ORDER — LACTATED RINGERS IV SOLN: INTRAVENOUS | Status: DC

## 2016-07-07 MED ORDER — PROTAMINE SULFATE 10 MG/ML IV SOLN
INTRAVENOUS | Status: DC | PRN
  Administered 2016-07-07: 20 mg via INTRAVENOUS
  Administered 2016-07-07: 10 mg via INTRAVENOUS
  Administered 2016-07-07 (×2): 20 mg via INTRAVENOUS
  Administered 2016-07-07: 10 mg via INTRAVENOUS

## 2016-07-07 MED ORDER — LIDOCAINE HCL (PF) 1 % IJ SOLN
INTRAMUSCULAR | Status: DC | PRN
  Administered 2016-07-07: 30 mL via INTRADERMAL

## 2016-07-07 MED ORDER — PROTAMINE SULFATE 10 MG/ML IV SOLN
INTRAVENOUS | Status: AC
  Filled 2016-07-07: qty 5

## 2016-07-07 MED ORDER — 0.9 % SODIUM CHLORIDE (POUR BTL) OPTIME
TOPICAL | Status: DC | PRN
  Administered 2016-07-07: 1000 mL

## 2016-07-07 MED ORDER — SODIUM CHLORIDE 0.9 % IV SOLN
INTRAVENOUS | Status: DC
  Administered 2016-07-07 (×2): via INTRAVENOUS

## 2016-07-07 MED ORDER — EPHEDRINE 5 MG/ML INJ
INTRAVENOUS | Status: AC
  Filled 2016-07-07: qty 10

## 2016-07-07 MED ORDER — PHENYLEPHRINE HCL 10 MG/ML IJ SOLN
INTRAMUSCULAR | Status: DC | PRN
  Administered 2016-07-07 (×3): 80 ug via INTRAVENOUS
  Administered 2016-07-07: 40 ug via INTRAVENOUS

## 2016-07-07 MED ORDER — HEMOSTATIC AGENTS (NO CHARGE) OPTIME
TOPICAL | Status: DC | PRN
  Administered 2016-07-07: 1 via TOPICAL

## 2016-07-07 MED ORDER — PHENYLEPHRINE HCL 10 MG/ML IJ SOLN
INTRAVENOUS | Status: DC | PRN
  Administered 2016-07-07: 50 ug/min via INTRAVENOUS

## 2016-07-07 MED ORDER — CHLORHEXIDINE GLUCONATE CLOTH 2 % EX PADS: 6.0000 | MEDICATED_PAD | Freq: Once | CUTANEOUS | Status: DC

## 2016-07-07 MED ORDER — ONDANSETRON HCL 4 MG/2ML IJ SOLN
INTRAMUSCULAR | Status: DC | PRN
Start: 2016-07-07 — End: 2016-07-07
  Administered 2016-07-07: 4 mg via INTRAVENOUS

## 2016-07-07 MED ORDER — FENTANYL CITRATE (PF) 250 MCG/5ML IJ SOLN
INTRAMUSCULAR | Status: AC
  Filled 2016-07-07: qty 5

## 2016-07-07 MED ORDER — OXYCODONE-ACETAMINOPHEN 5-325 MG PO TABS: 1.0000 | ORAL_TABLET | Freq: Four times a day (QID) | ORAL | 0 refills | Status: DC | PRN

## 2016-07-07 MED ORDER — FENTANYL CITRATE (PF) 100 MCG/2ML IJ SOLN
INTRAMUSCULAR | Status: DC | PRN
  Administered 2016-07-07 (×3): 25 ug via INTRAVENOUS

## 2016-07-07 MED ORDER — SODIUM CHLORIDE 0.9 % IV SOLN
INTRAVENOUS | Status: DC | PRN
  Administered 2016-07-07: 08:00:00 500 mL

## 2016-07-07 MED ORDER — MEPERIDINE HCL 25 MG/ML IJ SOLN: 6.2500 mg | INTRAMUSCULAR | Status: DC | PRN

## 2016-07-07 MED ORDER — PHENYLEPHRINE 40 MCG/ML (10ML) SYRINGE FOR IV PUSH (FOR BLOOD PRESSURE SUPPORT)
PREFILLED_SYRINGE | INTRAVENOUS | Status: AC
  Filled 2016-07-07: qty 10

## 2016-07-07 MED ORDER — PROPOFOL 10 MG/ML IV BOLUS
INTRAVENOUS | Status: DC | PRN
  Administered 2016-07-07: 150 mg via INTRAVENOUS
  Administered 2016-07-07: 30 mg via INTRAVENOUS

## 2016-07-07 MED ORDER — DEXTROSE 5 % IV SOLN
1.5000 g | INTRAVENOUS | Status: AC
  Administered 2016-07-07: 1.5 g via INTRAVENOUS
  Filled 2016-07-07: qty 1.5

## 2016-07-07 MED ORDER — HEPARIN SODIUM (PORCINE) 1000 UNIT/ML IJ SOLN
INTRAMUSCULAR | Status: DC | PRN
  Administered 2016-07-07: 10000 [IU] via INTRAVENOUS

## 2016-07-07 MED ORDER — PROPOFOL 10 MG/ML IV BOLUS
INTRAVENOUS | Status: AC
  Filled 2016-07-07: qty 20

## 2016-07-07 MED ORDER — PROMETHAZINE HCL 25 MG/ML IJ SOLN: 6.2500 mg | INTRAMUSCULAR | Status: DC | PRN

## 2016-07-07 MED ORDER — LIDOCAINE HCL (PF) 1 % IJ SOLN
INTRAMUSCULAR | Status: AC
  Filled 2016-07-07: qty 30

## 2016-07-07 MED ORDER — HEPARIN SODIUM (PORCINE) 1000 UNIT/ML IJ SOLN
1900.0000 [IU] | Freq: Once | INTRAMUSCULAR | Status: AC
  Administered 2016-07-07: 1900 [IU] via INTRAVENOUS

## 2016-07-07 MED ORDER — EPHEDRINE SULFATE 50 MG/ML IJ SOLN
INTRAMUSCULAR | Status: DC | PRN
  Administered 2016-07-07 (×2): 10 mg via INTRAVENOUS
  Administered 2016-07-07 (×2): 15 mg via INTRAVENOUS

## 2016-07-07 SURGICAL SUPPLY — 43 items
ARMBAND PINK RESTRICT EXTREMIT (MISCELLANEOUS) ×3 IMPLANT
BIOPATCH RED 1 DISK 7.0 (GAUZE/BANDAGES/DRESSINGS) ×2 IMPLANT
BIOPATCH RED 1IN DISK 7.0MM (GAUZE/BANDAGES/DRESSINGS) ×1
CANISTER SUCTION 2500CC (MISCELLANEOUS) ×3 IMPLANT
CLIP TI MEDIUM 6 (CLIP) ×3 IMPLANT
CLIP TI WIDE RED SMALL 6 (CLIP) ×3 IMPLANT
COVER PROBE W GEL 5X96 (DRAPES) ×3 IMPLANT
DECANTER SPIKE VIAL GLASS SM (MISCELLANEOUS) ×3 IMPLANT
DERMABOND ADHESIVE PROPEN (GAUZE/BANDAGES/DRESSINGS) ×2
DERMABOND ADVANCED (GAUZE/BANDAGES/DRESSINGS) ×2
DERMABOND ADVANCED .7 DNX12 (GAUZE/BANDAGES/DRESSINGS) ×1 IMPLANT
DERMABOND ADVANCED .7 DNX6 (GAUZE/BANDAGES/DRESSINGS) ×1 IMPLANT
ELECT REM PT RETURN 9FT ADLT (ELECTROSURGICAL) ×3
ELECTRODE REM PT RTRN 9FT ADLT (ELECTROSURGICAL) ×1 IMPLANT
GAUZE SPONGE 2X2 8PLY STRL LF (GAUZE/BANDAGES/DRESSINGS) ×1 IMPLANT
GAUZE SPONGE 4X4 16PLY XRAY LF (GAUZE/BANDAGES/DRESSINGS) ×3 IMPLANT
GLOVE BIO SURGEON STRL SZ7 (GLOVE) ×3 IMPLANT
GLOVE BIOGEL PI IND STRL 6.5 (GLOVE) ×3 IMPLANT
GLOVE BIOGEL PI IND STRL 7.5 (GLOVE) ×1 IMPLANT
GLOVE BIOGEL PI INDICATOR 6.5 (GLOVE) ×6
GLOVE BIOGEL PI INDICATOR 7.5 (GLOVE) ×2
GLOVE ECLIPSE 6.5 STRL STRAW (GLOVE) ×3 IMPLANT
GLOVE SURG SS PI 6.5 STRL IVOR (GLOVE) ×3 IMPLANT
GOWN STRL NON-REIN LRG LVL3 (GOWN DISPOSABLE) ×3 IMPLANT
GOWN STRL REUS W/ TWL LRG LVL3 (GOWN DISPOSABLE) ×3 IMPLANT
GOWN STRL REUS W/TWL LRG LVL3 (GOWN DISPOSABLE) ×6
GRAFT GORETEX STRT 4-7X45 (Vascular Products) ×3 IMPLANT
HEMOSTAT SPONGE AVITENE ULTRA (HEMOSTASIS) ×3 IMPLANT
KIT BASIN OR (CUSTOM PROCEDURE TRAY) ×3 IMPLANT
KIT ROOM TURNOVER OR (KITS) ×3 IMPLANT
NS IRRIG 1000ML POUR BTL (IV SOLUTION) ×3 IMPLANT
PACK CV ACCESS (CUSTOM PROCEDURE TRAY) ×3 IMPLANT
PAD ARMBOARD 7.5X6 YLW CONV (MISCELLANEOUS) ×6 IMPLANT
SPONGE GAUZE 2X2 STER 10/PKG (GAUZE/BANDAGES/DRESSINGS) ×2
SUT MNCRL AB 4-0 PS2 18 (SUTURE) ×6 IMPLANT
SUT PROLENE 5 0 C 1 24 (SUTURE) IMPLANT
SUT PROLENE 6 0 BV (SUTURE) ×6 IMPLANT
SUT PROLENE 7 0 BV 1 (SUTURE) ×3 IMPLANT
SUT SILK 2 0 FS (SUTURE) IMPLANT
SUT VIC AB 3-0 SH 27 (SUTURE) ×4
SUT VIC AB 3-0 SH 27X BRD (SUTURE) ×2 IMPLANT
UNDERPAD 30X30 (UNDERPADS AND DIAPERS) ×3 IMPLANT
WATER STERILE IRR 1000ML POUR (IV SOLUTION) ×3 IMPLANT

## 2016-07-07 NOTE — Op Note (Signed)
OPERATIVE NOTE   PROCEDURE:  left upper arm arteriovenous graft  PRE-OPERATIVE DIAGNOSIS: end stage renal disease   POST-OPERATIVE DIAGNOSIS: same as above   SURGEON: Adele Barthel, MD  ASSISTANT(S): RNFA  ANESTHESIA: general  ESTIMATED BLOOD LOSS: 50 cc  FINDING(S): 1. Severely diseased brachial artery with palpable pulse 2. Dopplerable biphasic radial signal at end of case 3. Faintly palpable thrill in arteriovenous graft 4. Some transudative weeping from graft  SPECIMEN(S):  none  INDICATIONS:   Patrick [name] is a 60 y.o. male who presents with end stage renal disease and history of failed arteriovenous graft in both arm.  He recently underwent a venogram which suggested his left arm might have an option for a left upper arm arteriovenous graft, possible hybrid graft.  Risk, benefits, and alternatives to access surgery were discussed.  The patient is aware the risks include but are not limited to: bleeding, infection, steal syndrome, nerve damage, ischemic monomelic neuropathy, failure to mature, and need for additional procedures.  The patient is aware of the risks and elects to proceed forward.  DESCRIPTION: After full informed written consent was obtained from the patient, the patient was brought back to the operating room and placed supine upon the operating table.  The patient was given IV antibiotics prior to proceeding.  After obtaining adequate sedation, the patient was prepped and draped in standard fashion for a left arm access procedure.  I turned my attention first to the antecubitum.  Under ultrasound guidance, I identified the location of the brachial artery and marked it on the skin.  I then examined the bicipital groove and identified the high brachial vein and marked it on the skin.  I injected 30 cc of 1% lidocaine without epinephrine at the antecubital exposure, axillary exposure and along the subcutaneous tunnel for the graft.  I made an incision over the  brachial artery, and dissected down through the subcutaneous tissue to the fascia carefully and was able to dissect out the brachial artery.  The artery was about 3.5-4.0 mm externally with obvious yellow atherosclerotic plaque evident.  It was controlled proximally and distally with vessel loops and then I turned my attention to the high bicipital groove.  I made an incision at the previously anesthetized site, dissected down through the subcutaneous tissue and fascia until I reached the high brachial vein.  Externally, it appeared to be 5-6 mm in diameter.  I then dissected this vein proximally and distal.    I took a metal Gore tunneler and dissected from the antecubital incision up to the high bicipital incision in the sub-fascial subcutaneous tissue.  Then I delivered the 4 x 7-mm stretch Gore-Tex graft, through this metal tunneler and then pulled out the metal tunneler leaving the graft in place.  The 4-mm end was left on the antecubital side and the 7 mm toward the axillary.    I then gave the patient 10000 units of heparin to gain therapeutic anticoagulation.  After waiting 3 minutes, I placed the brachial artery under tension proximally and distally with vessel loops, made an arteriotomy and extended it with a Potts scissor.  Immediately plaque was evident throughout both ends of the artery.  Fortunately, the plaque appeared to be densely adherent to the wall and soft enough to sew through.  I sewed the 4-mm end of the graft to this arteriotomy with a running stitch of 6-0 Prolene.  At this point, then I completed the anastomosis in the usual fashion.  I  released the vessel loops on the inflow and allowed the artery to decompress through the graft. There was good pulsatile bleeding through this graft.  I clamped the graft near its arterial anastomosis and sucked out all the blood in the graft and loaded the graft with heparinized saline.    At this point, I pulled the graft to appropriate length and  reset my exposure of the high brachial vein.  I tied off the high brachial vein distally with a 2-0 silk and then transected it.  There was good venous backbleeding from the vein.  Then, I injected some heparinized saline into this vein and then clamped it.  I then spatulated the vein to facilitate an end-to- end anastomosis.  I also spatulated the graft to facilitate an end-to-end anastomosis.  In the process of spatulating, I cut the graft to appropriate length for this anastomosis.  This graft was sewn to the vein in an end-to-end configuration with a 6-0 Prolene.  Prior to completing this anastomosis, I allowed the vein to back bleed and then I also allowed the artery to bleed in an antegrade fashion.  I completed this anastomosis in the usual fashion, and then irrigated out the high bicipital exposure and then placed Avitene.  Immediately, there was a palpable thrill in this graft.  I then turned my attention back to the antecubitum exposure.  The distal radial signal was dopplerable.  Using a continuous Doppler, the brachial artery proximally and distally had biphasic waveforms.  The venous outflow had a flow signature consistent with widely patent arteriovenous graft.  At this point, I washed out both incisions.  There was no more active bleeding.  Both incisions had some weeping of serous fluid through the graft wall.  The subcutaneous tissue in each incisions was reapproximated with a double layer of 3-0 Vicryl.  The skin in each incision was then reapproximated with a running subcuticular 4-0 Monocryl.  The skin in both incision was then cleaned, dried, and Dermabond used to reinforce the skin closure.     COMPLICATIONS: none  CONDITION: stable   Adele Barthel, MD [facility] Office: [phone] Pager: [phone]  07/07/2016, 10:24 AM

## 2016-07-07 NOTE — Transfer of Care (Signed)
Immediate Anesthesia Transfer of Care Note  Patient: Eddie Dibbles  Procedure(s) Performed: Procedure(s): INSERTION OF LEFT BRACHIAL TO AXILLARY ARTERIOVENOUS (AV) GORE-TEX ARM GRAFT (Left)  Patient Location: PACU  Anesthesia Type:General  Level of Consciousness: awake, alert , oriented and patient cooperative  Airway & Oxygen Therapy: Patient Spontanous Breathing and Patient connected to nasal cannula oxygen  Post-op Assessment: Report given to RN, Post -op Vital signs reviewed and stable and Patient moving all extremities  Post vital signs: Reviewed and stable  Last Vitals:  Vitals:   07/07/16 0640 07/07/16 1034  BP: (!) 91/57   Pulse: (!) 108 63  Resp: 20   Temp: 36.4 C 36.8 C    Last Pain:  Vitals:   07/07/16 0640  TempSrc: Oral         Complications: No apparent anesthesia complications

## 2016-07-07 NOTE — Anesthesia Procedure Notes (Signed)
Procedure Name: LMA Insertion Date/Time: 07/07/2016 7:42 AM Performed by: Myna Bright Pre-anesthesia Checklist: Patient identified, Emergency Drugs available, Suction available and Patient being monitored Patient Re-evaluated:Patient Re-evaluated prior to inductionOxygen Delivery Method: Circle system utilized Preoxygenation: Pre-oxygenation with 100% oxygen Intubation Type: IV induction Ventilation: Mask ventilation without difficulty LMA: LMA inserted LMA Size: 4.0 Tube type: Oral Number of attempts: 1 Placement Confirmation: positive ETCO2 and breath sounds checked- equal and bilateral Tube secured with: Tape Dental Injury: Teeth and Oropharynx as per pre-operative assessment

## 2016-07-07 NOTE — Anesthesia Postprocedure Evaluation (Signed)
Anesthesia Post Note  Patient: Patrick [name]  Procedure(s) Performed: Procedure(s) (LRB): INSERTION OF LEFT BRACHIAL TO AXILLARY ARTERIOVENOUS (AV) GORE-TEX ARM GRAFT (Left)  Patient location during evaluation: PACU Anesthesia Type: General Level of consciousness: awake and alert Pain management: pain level controlled Vital Signs Assessment: post-procedure vital signs reviewed and stable Respiratory status: spontaneous breathing, nonlabored ventilation, respiratory function stable and patient connected to nasal cannula oxygen Cardiovascular status: blood pressure returned to baseline and stable Postop Assessment: no signs of nausea or vomiting Anesthetic complications: no    Last Vitals:  Vitals:   07/07/16 1145 07/07/16 1215  BP: 108/68 96/65  Pulse: 64 (!) 59  Resp: 19   Temp: 36.4 C     Last Pain:  Vitals:   07/07/16 0640  TempSrc: Oral                 Effie Berkshire

## 2016-07-07 NOTE — H&P (Signed)
Brief History and Physical  History of Present Illness  Patrick [name] is a 60 y.o. male who presents with chief complaint: ESRD.  The patient presents today for LUA AVG vs LUA hybrid AVG placment.    Past Medical History:  Diagnosis Date  . Arthritis   . Diabetes mellitus without complication ([place])    type 2  . Dysrhythmia    pt. told that he has a "flutter"   . End-stage renal failure with renal transplant ([place])    SE- on T,TH & SAt  . Gout   . History of blood transfusion 2016  . History of kidney stones    treated with nephrectomy  . Hypertension   . Impotence of organic origin   . Myocardial infarction    '96  . Pneumonia    " a touch & I was in the hosp."  . Secondary hyperparathyroidism ([place])   . Type I (juvenile type) diabetes mellitus with renal manifestations, not stated as uncontrolled(250.41)    Pt. TYPE 2, not type one     Past Surgical History:  Procedure Laterality Date  . APPENDECTOMY    . AV FISTULA PLACEMENT Right 09/18/2014   Procedure: INSERTION OF ARTERIOVENOUS (AV) GORE-TEX GRAFT ARM USING 4-7MM  X 45CM STRETCH GORE-TEX VASCULAR GRAFT;  Surgeon: Rosetta Posner, MD;  Location: [facility];  Service: Vascular;  Laterality: Right;  . birth defect heart surgery  1996  . HERNIA REPAIR     with nephrectomy  . IR GENERIC HISTORICAL  05/11/2016   IR FLUORO GUIDE CV LINE LEFT 05/11/2016 Corrie Mckusick, DO [facility]  . IR GENERIC HISTORICAL  05/11/2016   IR US GUIDE VASC ACCESS LEFT 05/11/2016 Corrie Mckusick, DO [facility]  . IR GENERIC HISTORICAL  05/11/2016   IR US GUIDE VASC ACCESS RIGHT 05/11/2016 Corrie Mckusick, DO [facility]  . IR GENERIC HISTORICAL  05/11/2016   IR RADIOLOGY PERIPHERAL GUIDED IV START 05/11/2016 Corrie Mckusick, DO [facility]  . KIDNEY SURGERY     transplant rejected   . KIDNEY TRANSPLANT  2009  . PERIPHERAL VASCULAR CATHETERIZATION N/A 06/04/2016   Procedure: Upper Extremity Venography;  Surgeon: Waynetta Sandy, MD;   Location: [facility];  Service: Cardiovascular;  Laterality: N/A;    Social History   Social History  . Marital status: Married    Spouse name: N/A  . Number of children: N/A  . Years of education: N/A   Occupational History  . Not on file.   Social History Main Topics  . Smoking status: Former Smoker    Quit date: 07/04/2014  . Smokeless tobacco: Never Used  . Alcohol use 0.0 oz/week     Comment: occasion- rare  . Drug use: No  . Sexual activity: Not Currently    Birth control/ protection: None   Other Topics Concern  . Not on file   Social History Narrative  . No narrative on file    Family History  Problem Relation Age of Onset  . Hyperlipidemia Mother   . Hypertension Mother     No current facility-administered medications on file prior to encounter.    Current Outpatient Prescriptions on File Prior to Encounter  Medication Sig Dispense Refill  . acetaminophen (TYLENOL) 500 MG tablet Take 1,000 mg by mouth every 6 (six) hours as needed for moderate pain or headache.    . albuterol (PROVENTIL HFA;VENTOLIN HFA) 108 (90 Base) MCG/ACT inhaler Inhale 1-2 puffs into the lungs every 6 (  six) hours as needed for wheezing or shortness of breath.    Marland Kitchen amLODipine (NORVASC) 2.5 MG tablet Take 2.5 mg by mouth daily.    . calcitRIOL (ROCALTROL) 0.5 MCG capsule Take 0.5 mcg by mouth 4 (four) times daily.     . calcium carbonate (OS-CAL - DOSED IN MG OF ELEMENTAL CALCIUM) 1250 (500 Ca) MG tablet Take 1 tablet by mouth daily with breakfast.    . colchicine 0.6 MG tablet Take 1 tablet (0.6 mg total) by mouth every 14 (fourteen) days.    . isosorbide mononitrate (IMDUR) 30 MG 24 hr tablet Take 1 tablet (30 mg total) by mouth daily. 30 tablet 0  . repaglinide (PRANDIN) 0.5 MG tablet Take 1 tablet (0.5 mg total) by mouth 2 (two) times daily before a meal. (Patient taking differently: Take 0.5 mg by mouth 2 (two) times daily. ) 60 tablet 11  . VELPHORO 500 MG chewable tablet Chew  500 mg by mouth 4 (four) times daily.    . metoprolol (LOPRESSOR) 100 MG tablet Take 1 tablet (100 mg total) by mouth 2 (two) times daily. (Patient not taking: Reported on 07/02/2016) 60 tablet 0  . predniSONE (DELTASONE) 50 MG tablet One tablet (50mg ) 13 hours prior to procedure; one tablet (50mg ) 7 hours prior to procedure and then one tablet (50 mg) one hour prior to procedure. (Patient not taking: Reported on 07/02/2016) 3 tablet 0  . warfarin (COUMADIN) 7.5 MG tablet Take 1 tablet (7.5 mg total) by mouth daily at 6 PM. (Patient taking differently: Take 7.5 mg by mouth as directed. Take 7.5 mgs daily on Monday, Wednesday, Friday, Saturday, and Sunday) 7 tablet 0    Allergies  Allergen Reactions  . Lipitor [Atorvastatin]     Leg pain  . Metoprolol     Headaches   . Iodinated Diagnostic Agents Rash    Other Reaction: burning to mouth, swelling of lips.    Review of Systems: As listed above, otherwise negative.  Physical Examination  Vitals:   07/07/16 0640  BP: (!) 91/57  Pulse: (!) 108  Resp: 20  Temp: 97.6 F (36.4 C)  TempSrc: Oral  SpO2: 100%  Weight: 217 lb (98.4 kg)  Height: 5' 6.5" (1.689 m)    General: A&O x 3, WDWN  Pulmonary: Sym exp, good air movt, CTAB, no rales, rhonchi, & wheezing  Cardiac: RRR, Nl S1, S2, no Murmurs, rubs or gallops  Gastrointestinal: soft, NTND, -G/R, - HSM, - masses, - CVAT B  Musculoskeletal: M/S 5/5 throughout , Extremities without ischemic changes   Laboratory See iStat  Medical Decision Making  Patrick [name] is a 60 y.o. male who presents with: ESRD.   The patient is scheduled for: LUA AVG vs LUA Hybrid AVG The patient is aware that the hybrid AVG placement involves placement of graft-stent device. I discussed with the patient the nature of angiographic procedures, especially the limited patencies of any endovascular intervention.  The patient is aware of that the risks of an angiographic procedure include but are not limited  to: bleeding, infection, access site complications, renal failure, embolization, rupture of vessel, dissection, possible need for emergent surgical intervention, possible need for surgical procedures to treat the patient's pathology, and stroke and death.    Risk, benefits, and alternatives to access surgery were discussed.  The patient is aware the risks include but are not limited to: bleeding, infection, steal syndrome, nerve damage, ischemic monomelic neuropathy, failure to mature, and need for additional procedures.  The  patient is aware of the risks and agrees to proceed.  Adele Barthel, MD [facility] Office: [phone] Pager: [phone]  07/07/2016, 7:22 AM

## 2016-07-08 ENCOUNTER — Encounter (HOSPITAL_COMMUNITY): Payer: Self-pay | Admitting: Vascular Surgery

## 2016-07-08 ENCOUNTER — Institutional Professional Consult (permissible substitution): Payer: Medicare Other | Admitting: Internal Medicine

## 2016-07-08 DIAGNOSIS — N186 End stage renal disease: Secondary | ICD-10-CM | POA: Diagnosis not present

## 2016-07-08 DIAGNOSIS — D631 Anemia in chronic kidney disease: Secondary | ICD-10-CM | POA: Diagnosis not present

## 2016-07-08 DIAGNOSIS — E1029 Type 1 diabetes mellitus with other diabetic kidney complication: Secondary | ICD-10-CM | POA: Diagnosis not present

## 2016-07-10 DIAGNOSIS — D631 Anemia in chronic kidney disease: Secondary | ICD-10-CM | POA: Diagnosis not present

## 2016-07-10 DIAGNOSIS — N186 End stage renal disease: Secondary | ICD-10-CM | POA: Diagnosis not present

## 2016-07-10 DIAGNOSIS — E1029 Type 1 diabetes mellitus with other diabetic kidney complication: Secondary | ICD-10-CM | POA: Diagnosis not present

## 2016-07-12 DIAGNOSIS — N186 End stage renal disease: Secondary | ICD-10-CM | POA: Diagnosis not present

## 2016-07-12 DIAGNOSIS — D631 Anemia in chronic kidney disease: Secondary | ICD-10-CM | POA: Diagnosis not present

## 2016-07-12 DIAGNOSIS — E1029 Type 1 diabetes mellitus with other diabetic kidney complication: Secondary | ICD-10-CM | POA: Diagnosis not present

## 2016-07-15 DIAGNOSIS — D631 Anemia in chronic kidney disease: Secondary | ICD-10-CM | POA: Diagnosis not present

## 2016-07-15 DIAGNOSIS — N186 End stage renal disease: Secondary | ICD-10-CM | POA: Diagnosis not present

## 2016-07-15 DIAGNOSIS — E1029 Type 1 diabetes mellitus with other diabetic kidney complication: Secondary | ICD-10-CM | POA: Diagnosis not present

## 2016-07-16 ENCOUNTER — Ambulatory Visit (INDEPENDENT_AMBULATORY_CARE_PROVIDER_SITE_OTHER): Payer: Medicare Other | Admitting: Cardiology

## 2016-07-16 ENCOUNTER — Other Ambulatory Visit: Payer: Self-pay | Admitting: Cardiology

## 2016-07-16 ENCOUNTER — Encounter: Payer: Self-pay | Admitting: Cardiology

## 2016-07-16 VITALS — BP 108/58 | HR 78 | Ht 66.5 in | Wt 218.2 lb

## 2016-07-16 DIAGNOSIS — M79605 Pain in left leg: Secondary | ICD-10-CM | POA: Diagnosis not present

## 2016-07-16 DIAGNOSIS — I483 Typical atrial flutter: Secondary | ICD-10-CM | POA: Diagnosis not present

## 2016-07-16 DIAGNOSIS — M79604 Pain in right leg: Secondary | ICD-10-CM | POA: Diagnosis not present

## 2016-07-16 NOTE — Patient Instructions (Addendum)
Medication Instructions:    Your physician recommends that you continue on your current medications as directed. Please refer to the Current Medication list given to you today.  --- If you need a refill on your cardiac medications before your next appointment, please call your pharmacy. ---  Labwork:  None ordered  Testing/Procedures: Your physician has requested that you have an ankle brachial index (ABI). During this test an ultrasound and blood pressure cuff are used to evaluate the arteries that supply the arms and legs with blood. Allow thirty minutes for this exam. There are no restrictions or special instructions.  Your physician has recommended that you have an ablation. Catheter ablation is a medical procedure used to treat some cardiac arrhythmias (irregular heartbeats). During catheter ablation, a long, thin, flexible tube is put into a blood vessel in your groin (upper thigh), or neck. This tube is called an ablation catheter. It is then guided to your heart through the blood vessel. Radio frequency waves destroy small areas of heart tissue where abnormal heartbeats may cause an arrhythmia to start.   Mehgan Santmyer, RN will call you to schedule this procedure.  Follow-Up:  To be determined after ablation is scheduled.  Thank you for choosing [facility]!!   Trinidad Curet, RN [phone]  Any Other Special Instructions Will Be Listed Below (If Applicable).   Cardiac Ablation Cardiac ablation is a procedure to stop some heart tissue from causing problems. The heart has many electrical connections. Sometimes these connections cause the heart to beat very fast or irregularly. Removing some of the problem areas can improve heart rhythm or make it normal. Ablation is done for people who:  Have Wolff-Parkinson-White syndrome.  Have other fast heart rhythms (tachycardia).  Have taken medicines for an abnormal heart rhythm (arrhythmia) and the medicines had:  No  success.  Side effects.  May have a type of heartbeat that could cause death. What happens before the procedure?  Follow instructions from your doctor about eating and drinking before the procedure.  Take your medicines as told by your doctor. Take them at regular times with water unless told differently by your doctor.  If you are taking diabetes medicine, ask your doctor how to take it. Ask if there are any special instructions you should follow. Your doctor may change how much insulin you take the day of the procedure. What happens during the procedure?  A special type of X-ray will be used. The X-ray helps your doctor see images of your heart during the procedure.  A small cut (incision) will be made in your neck or groin.  An IV tube will be started before the procedure begins.  You will be given a numbing medicine (anesthetic) or a medicine to help you relax (sedative).  The skin on your neck or groin will be numbed.  A needle will be put into a large vein in your neck or groin.  A thin, flexible tube (catheter) will be put in to reach your heart.  A dye will be put in the tube. The dye will show up on X-rays. It will help your doctor see the area of the heart that needs treatment.  When the heart tissue that is causing problems is found, the tip of the tube will send an electrical current to it. This will stop it from causing problems.  The tube will be taken out.  Pressure will be put on the area where the tube was. This will keep it from bleeding. A  bandage will be placed over the area. What happens after the procedure?  You will be taken to a recovery area. Your blood pressure, heart rate, and breathing will be watched. The area where the tube was will also be watched for bleeding.  You will need to lie still for 4-6 hours. This keeps the area where the tube was from bleeding. This information is not intended to replace advice given to you by your health care  provider. Make sure you discuss any questions you have with your health care provider. Document Released: 03/16/2013 Document Revised: 12/20/2015 Document Reviewed: 12/09/2012 Elsevier Interactive Patient Education  2017 [place] Index Test Introduction The ankle-brachial index (ABI) test is used to find peripheral vascular disease (PVD). PVD is also known as peripheral arterial disease (PAD). PVD is the blocking or hardening of the arteries anywhere within the circulatory system beyond the heart. PVD is caused by cholesterol deposits in your blood vessels (atherosclerosis). These deposits cause arteries to narrow. The delivery of oxygen to your tissues is impaired as a result. This can cause muscle pain and fatigue. This is called claudication. PVD means there may also be buildup of cholesterol in your:  Heart. This increases the risk of heart attacks.  Brain. This increases the risk of strokes. The ankle-brachial index test measures the blood flow in your arms and legs. This test also determines if blood vessels in your leg are narrowed by cholesterol deposits. There are additional causes of a reduced ankle-brachial index, such as inflammation of vessels or a clot in the vessels. However, these are much less common than narrowing due to cholesterol deposits. What is being tested? The test is done while you are lying down and resting. Measurements are taken of the systolic pressure:  In your arm (brachial).  In your ankle at several points along your leg. Systolic pressure is the pressure inside your arteries when your heart pumps. The measurements are taken several times on both sides. Then, the highest systolic pressure of the ankle is divided by the highest brachial systolic pressure. The result is the ankle-brachial pressure ratio, or ABI. Sometimes this test is repeated after you have exercised on a treadmill for five minutes. You may have leg pain during the  exercise portion of the test if you suffer from PAD. If the index number drops after exercise, this may show that PAD is present. A normal ABI ratio is between 0.9 and 1.4. A value below 0.9 is considered abnormal. This information is not intended to replace advice given to you by your health care provider. Make sure you discuss any questions you have with your health care provider. Document Released: 07/18/2004 Document Revised: 12/20/2015 Document Reviewed: 02/17/2014  2017 Elsevier

## 2016-07-16 NOTE — Addendum Note (Signed)
Addended by: Stanton Kidney on: 07/16/2016 12:09 PM   Modules accepted: Orders

## 2016-07-16 NOTE — Addendum Note (Signed)
Addended by: Stanton Kidney on: 07/16/2016 12:18 PM   Modules accepted: Orders

## 2016-07-16 NOTE — Progress Notes (Signed)
Electrophysiology Office Note   Date:  07/16/2016   ID:  Patrick [name], DOB [birth year], MRN [id number]  PCP:  Elyn Peers, MD  Cardiologist:  Einar Gip Primary Electrophysiologist:  Constance Haw, MD    Chief Complaint  Patient presents with  . Follow-up    Aflutter     History of Present Illness: Patrick [name] is a 60 y.o. male who presents today for electrophysiology evaluation.   He presented to the hospital in atrial flutter in October. Since that time, he is felt much better without evidence of palpitations. He was started on Coumadin at the time for stroke prevention. He says that he has been having leg pain when he walks. He says that he can walk approximately 50 feet without having discomfort. He does not have sores or skin discoloration.   Today, he denies symptoms of palpitations, chest pain, shortness of breath, orthopnea, PND, lower extremity edema, claudication, dizziness, presyncope, syncope, bleeding, or neurologic sequela. The patient is tolerating medications without difficulties and is otherwise without complaint today.    Past Medical History:  Diagnosis Date  . Arthritis   . Diabetes mellitus without complication ([place])    type 2  . Dysrhythmia    pt. told that he has a "flutter"   . End-stage renal failure with renal transplant ([place])    SE- on T,TH & SAt  . Gout   . History of blood transfusion 2016  . History of kidney stones    treated with nephrectomy  . Hypertension   . Impotence of organic origin   . Myocardial infarction    '96  . Pneumonia    " a touch & I was in the hosp."  . Secondary hyperparathyroidism ([place])   . Type I (juvenile type) diabetes mellitus with renal manifestations, not stated as uncontrolled(250.41)    Pt. TYPE 2, not type one    Past Surgical History:  Procedure Laterality Date  . APPENDECTOMY    . AV FISTULA PLACEMENT Right 09/18/2014   Procedure: INSERTION OF ARTERIOVENOUS (AV) GORE-TEX GRAFT ARM USING 4-7MM  X  45CM STRETCH GORE-TEX VASCULAR GRAFT;  Surgeon: Rosetta Posner, MD;  Location: [facility];  Service: Vascular;  Laterality: Right;  . AV FISTULA PLACEMENT Left 07/07/2016   Procedure: INSERTION OF LEFT BRACHIAL TO AXILLARY ARTERIOVENOUS (AV) GORE-TEX ARM GRAFT;  Surgeon: Conrad Fountain, MD;  Location: [facility];  Service: Vascular;  Laterality: Left;  . birth defect heart surgery  1996  . HERNIA REPAIR     with nephrectomy  . IR GENERIC HISTORICAL  05/11/2016   IR FLUORO GUIDE CV LINE LEFT 05/11/2016 Corrie Mckusick, DO [facility]  . IR GENERIC HISTORICAL  05/11/2016   IR US GUIDE VASC ACCESS LEFT 05/11/2016 Corrie Mckusick, DO [facility]  . IR GENERIC HISTORICAL  05/11/2016   IR US GUIDE VASC ACCESS RIGHT 05/11/2016 Corrie Mckusick, DO [facility]  . IR GENERIC HISTORICAL  05/11/2016   IR RADIOLOGY PERIPHERAL GUIDED IV START 05/11/2016 Corrie Mckusick, DO [facility]  . KIDNEY SURGERY     transplant rejected   . KIDNEY TRANSPLANT  2009  . PERIPHERAL VASCULAR CATHETERIZATION N/A 06/04/2016   Procedure: Upper Extremity Venography;  Surgeon: Waynetta Sandy, MD;  Location: [facility];  Service: Cardiovascular;  Laterality: N/A;     Current Outpatient Prescriptions  Medication Sig Dispense Refill  . acetaminophen (TYLENOL) 500 MG tablet Take 1,000 mg by mouth every 6 (six) hours as needed for moderate pain  or headache.    . albuterol (PROVENTIL HFA;VENTOLIN HFA) 108 (90 Base) MCG/ACT inhaler Inhale 1-2 puffs into the lungs every 6 (six) hours as needed for wheezing or shortness of breath.    Marland Kitchen amLODipine (NORVASC) 2.5 MG tablet Take 2.5 mg by mouth daily.    . calcitRIOL (ROCALTROL) 0.5 MCG capsule Take 0.5 mcg by mouth 4 (four) times daily.     . calcium acetate (PHOSLO) 667 MG capsule Take 1,334 mg by mouth 3 (three) times daily with meals.    . calcium carbonate (OS-CAL - DOSED IN MG OF ELEMENTAL CALCIUM) 1250 (500 Ca) MG tablet Take 1 tablet by mouth daily with breakfast.    .  carvedilol (COREG) 25 MG tablet Take 25 mg by mouth 2 (two) times daily.    . colchicine 0.6 MG tablet Take 1 tablet (0.6 mg total) by mouth every 14 (fourteen) days.    . isosorbide mononitrate (IMDUR) 30 MG 24 hr tablet Take 1 tablet (30 mg total) by mouth daily. 30 tablet 0  . oxyCODONE-acetaminophen (ROXICET) 5-325 MG tablet Take 1 tablet by mouth every 6 (six) hours as needed for moderate pain. 10 tablet 0  . predniSONE (DELTASONE) 50 MG tablet One tablet (50mg ) 13 hours prior to procedure; one tablet (50mg ) 7 hours prior to procedure and then one tablet (50 mg) one hour prior to procedure. 3 tablet 0  . repaglinide (PRANDIN) 0.5 MG tablet Take 1 tablet (0.5 mg total) by mouth 2 (two) times daily before a meal. (Patient taking differently: Take 0.5 mg by mouth 2 (two) times daily. ) 60 tablet 11  . VELPHORO 500 MG chewable tablet Chew 500 mg by mouth 4 (four) times daily.    Marland Kitchen warfarin (COUMADIN) 10 MG tablet Take 10 mg by mouth as directed. Take 10 mgs daily on Tuesdays and Thursdays    . warfarin (COUMADIN) 7.5 MG tablet Take 1 tablet (7.5 mg total) by mouth daily at 6 PM. (Patient taking differently: Take 7.5 mg by mouth as directed. Take 7.5 mgs daily on Monday, Wednesday, Friday, Saturday, and Sunday) 7 tablet 0   No current facility-administered medications for this visit.     Allergies:   Lipitor [atorvastatin]; Metoprolol; and Iodinated diagnostic agents   Social History:  The patient  reports that he quit smoking about 2 years ago. He has never used smokeless tobacco. He reports that he drinks alcohol. He reports that he does not use drugs.   Family History:  The patient's family history includes Hyperlipidemia in his mother; Hypertension in his mother.    ROS:  Please see the history of present illness.   Otherwise, review of systems is positive for none.   All other systems are reviewed and negative.    PHYSICAL EXAM: VS:  BP (!) 108/58   Pulse 78   Ht 5' 6.5" (1.689 m)    Wt 218 lb 3.2 oz (99 kg)   BMI 34.69 kg/m  , BMI Body mass index is 34.69 kg/m. GEN: Well nourished, well developed, in no acute distress  HEENT: normal  Neck: no JVD, carotid bruits, or masses Cardiac: RRR; no murmurs, rubs, or gallops,no edema, decreased lower extremity pulses  Respiratory:  clear to auscultation bilaterally, normal work of breathing GI: soft, nontender, nondistended, + BS MS: no deformity or atrophy  Skin: warm and dry Neuro:  Strength and sensation are intact Psych: euthymic mood, full affect  EKG:  EKG is not ordered today. Personal review of the ekg  ordered 05/26/16 shows atrial flutter, 2:1 conduction  Recent Labs: 01/22/2016: B Natriuretic Peptide >4,500.0; Magnesium 1.8 01/23/2016: ALT 8 05/25/2016: TSH 5.113 05/26/2016: Platelets 123 06/04/2016: BUN 54; BUN 52; Creatinine, Ser 12.40; Creatinine, Ser 12.80 07/07/2016: Hemoglobin 10.2; Potassium 5.7; Sodium 136    Lipid Panel     Component Value Date/Time   CHOL 192 05/25/2016 1542   TRIG 249 (H) 05/25/2016 1542   HDL 34 (L) 05/25/2016 1542   CHOLHDL 5.6 05/25/2016 1542   VLDL 50 (H) 05/25/2016 1542   LDLCALC 108 (H) 05/25/2016 1542     Wt Readings from Last 3 Encounters:  07/16/16 218 lb 3.2 oz (99 kg)  07/07/16 217 lb (98.4 kg)  06/04/16 217 lb (98.4 kg)      Other studies Reviewed: Additional studies/ records that were reviewed today include: TTE 01/25/16  Review of the above records today demonstrates:  - Left ventricle: There was mild concentric hypertrophy. Systolic   function was mildly reduced. The estimated ejection fraction was   in the range of 45% to 50%. Diffuse hypokinesis. Features are   consistent with a pseudonormal left ventricular filling pattern,   with concomitant abnormal relaxation and increased filling   pressure (grade 2 diastolic dysfunction). Doppler parameters are   consistent with both elevated ventricular end-diastolic filling   pressure and elevated left  atrial filling pressure. - Aortic valve: Normal-sized, mildly calcified annulus. Trileaflet.   Moderate calcification involving the left coronary and   noncoronary cusp. There was very mild stenosis. Mean gradient   (S): 13 mm Hg. Peak gradient (S): 26 mm Hg. Valve area (VTI):   1.02 cm^2. Valve area (Vmax): 0.86 cm^2. Valve area (Vmean): 0.9   cm^2. - Mitral valve: Calcified annulus. There was mild regurgitation.   Valve area by continuity equation (using LVOT flow): 1.35 cm^2. - Left atrium: The atrium was mildly dilated. - Atrial septum: No defect or patent foramen ovale was identified.   ASSESSMENT AND PLAN:  1.  Atrial flutter: Early on Coumadin. I discussed the risks and benefits of ablation. Risks include bleeding, tamponade, heart block, stroke, among others. He understands these risks and has agreed to ablation. We Malynn Lucy continue his Coumadin at the current dosage as monitored by his dialysis center.  2. Leg pain: Currently has symptoms that are consistent with peripheral arterial disease. He is having leg pain when walking, and has risk factors such as diabetes and end-stage renal disease. We'll order ABIs.    Current medicines are reviewed at length with the patient today.   The patient does not have concerns regarding his medicines.  The following changes were made today:  none  Labs/ tests ordered today include:  No orders of the defined types were placed in this encounter.    Disposition:   FU with Yaser Harvill 3 months  Signed, Maritsa Hunsucker Meredith Leeds, MD  07/16/2016 11:41 AM     [facility] [address] [place] [address] [phone] (office) [phone] (fax)

## 2016-07-17 DIAGNOSIS — N186 End stage renal disease: Secondary | ICD-10-CM | POA: Diagnosis not present

## 2016-07-17 DIAGNOSIS — D631 Anemia in chronic kidney disease: Secondary | ICD-10-CM | POA: Diagnosis not present

## 2016-07-17 DIAGNOSIS — E1029 Type 1 diabetes mellitus with other diabetic kidney complication: Secondary | ICD-10-CM | POA: Diagnosis not present

## 2016-07-19 DIAGNOSIS — N186 End stage renal disease: Secondary | ICD-10-CM | POA: Diagnosis not present

## 2016-07-19 DIAGNOSIS — D631 Anemia in chronic kidney disease: Secondary | ICD-10-CM | POA: Diagnosis not present

## 2016-07-19 DIAGNOSIS — E1029 Type 1 diabetes mellitus with other diabetic kidney complication: Secondary | ICD-10-CM | POA: Diagnosis not present

## 2016-07-22 DIAGNOSIS — D631 Anemia in chronic kidney disease: Secondary | ICD-10-CM | POA: Diagnosis not present

## 2016-07-22 DIAGNOSIS — E1029 Type 1 diabetes mellitus with other diabetic kidney complication: Secondary | ICD-10-CM | POA: Diagnosis not present

## 2016-07-22 DIAGNOSIS — N186 End stage renal disease: Secondary | ICD-10-CM | POA: Diagnosis not present

## 2016-07-24 DIAGNOSIS — E1029 Type 1 diabetes mellitus with other diabetic kidney complication: Secondary | ICD-10-CM | POA: Diagnosis not present

## 2016-07-24 DIAGNOSIS — I4891 Unspecified atrial fibrillation: Secondary | ICD-10-CM | POA: Diagnosis not present

## 2016-07-24 DIAGNOSIS — D631 Anemia in chronic kidney disease: Secondary | ICD-10-CM | POA: Diagnosis not present

## 2016-07-24 DIAGNOSIS — N186 End stage renal disease: Secondary | ICD-10-CM | POA: Diagnosis not present

## 2016-07-24 NOTE — Progress Notes (Signed)
Called pt to cancel LE study.  Pt tells me that Dr. Irven Shelling office called him this week and has discharged him from their practice (secondary to financial debt owed).  I will discuss w/ Dr. Curt Bears tomorrow before cancelling study. Pt is aware I will call him once discussed w/ Camnitz

## 2016-07-25 NOTE — Progress Notes (Signed)
Informed Dr. Curt Bears pt d/c from [facility] practice.  Will keep scheduled ABIs next month. Will follow up after ablation in February, and advised pt we would refer him to a general cardiologist in our office at that time. Pt informed and agreeable to plan.

## 2016-07-26 DIAGNOSIS — E1029 Type 1 diabetes mellitus with other diabetic kidney complication: Secondary | ICD-10-CM | POA: Diagnosis not present

## 2016-07-26 DIAGNOSIS — D631 Anemia in chronic kidney disease: Secondary | ICD-10-CM | POA: Diagnosis not present

## 2016-07-26 DIAGNOSIS — N186 End stage renal disease: Secondary | ICD-10-CM | POA: Diagnosis not present

## 2016-07-27 DIAGNOSIS — N186 End stage renal disease: Secondary | ICD-10-CM | POA: Diagnosis not present

## 2016-07-27 DIAGNOSIS — T8610 Unspecified complication of kidney transplant: Secondary | ICD-10-CM | POA: Diagnosis not present

## 2016-07-27 DIAGNOSIS — Z992 Dependence on renal dialysis: Secondary | ICD-10-CM | POA: Diagnosis not present

## 2016-07-29 DIAGNOSIS — N186 End stage renal disease: Secondary | ICD-10-CM | POA: Diagnosis not present

## 2016-07-29 DIAGNOSIS — E1029 Type 1 diabetes mellitus with other diabetic kidney complication: Secondary | ICD-10-CM | POA: Diagnosis not present

## 2016-07-29 DIAGNOSIS — N2581 Secondary hyperparathyroidism of renal origin: Secondary | ICD-10-CM | POA: Diagnosis not present

## 2016-07-29 DIAGNOSIS — D631 Anemia in chronic kidney disease: Secondary | ICD-10-CM | POA: Diagnosis not present

## 2016-07-31 DIAGNOSIS — E1029 Type 1 diabetes mellitus with other diabetic kidney complication: Secondary | ICD-10-CM | POA: Diagnosis not present

## 2016-07-31 DIAGNOSIS — N186 End stage renal disease: Secondary | ICD-10-CM | POA: Diagnosis not present

## 2016-07-31 DIAGNOSIS — D631 Anemia in chronic kidney disease: Secondary | ICD-10-CM | POA: Diagnosis not present

## 2016-07-31 DIAGNOSIS — N2581 Secondary hyperparathyroidism of renal origin: Secondary | ICD-10-CM | POA: Diagnosis not present

## 2016-08-05 DIAGNOSIS — E1029 Type 1 diabetes mellitus with other diabetic kidney complication: Secondary | ICD-10-CM | POA: Diagnosis not present

## 2016-08-05 DIAGNOSIS — N2581 Secondary hyperparathyroidism of renal origin: Secondary | ICD-10-CM | POA: Diagnosis not present

## 2016-08-05 DIAGNOSIS — N186 End stage renal disease: Secondary | ICD-10-CM | POA: Diagnosis not present

## 2016-08-05 DIAGNOSIS — D631 Anemia in chronic kidney disease: Secondary | ICD-10-CM | POA: Diagnosis not present

## 2016-08-07 DIAGNOSIS — N2581 Secondary hyperparathyroidism of renal origin: Secondary | ICD-10-CM | POA: Diagnosis not present

## 2016-08-07 DIAGNOSIS — N186 End stage renal disease: Secondary | ICD-10-CM | POA: Diagnosis not present

## 2016-08-07 DIAGNOSIS — D631 Anemia in chronic kidney disease: Secondary | ICD-10-CM | POA: Diagnosis not present

## 2016-08-07 DIAGNOSIS — E1029 Type 1 diabetes mellitus with other diabetic kidney complication: Secondary | ICD-10-CM | POA: Diagnosis not present

## 2016-08-09 DIAGNOSIS — E1029 Type 1 diabetes mellitus with other diabetic kidney complication: Secondary | ICD-10-CM | POA: Diagnosis not present

## 2016-08-09 DIAGNOSIS — N2581 Secondary hyperparathyroidism of renal origin: Secondary | ICD-10-CM | POA: Diagnosis not present

## 2016-08-09 DIAGNOSIS — D631 Anemia in chronic kidney disease: Secondary | ICD-10-CM | POA: Diagnosis not present

## 2016-08-09 DIAGNOSIS — N186 End stage renal disease: Secondary | ICD-10-CM | POA: Diagnosis not present

## 2016-08-12 ENCOUNTER — Encounter: Payer: Self-pay | Admitting: Vascular Surgery

## 2016-08-12 ENCOUNTER — Inpatient Hospital Stay (HOSPITAL_COMMUNITY): Admission: RE | Admit: 2016-08-12 | Payer: Medicare Other | Source: Ambulatory Visit

## 2016-08-12 DIAGNOSIS — D631 Anemia in chronic kidney disease: Secondary | ICD-10-CM | POA: Diagnosis not present

## 2016-08-12 DIAGNOSIS — E1029 Type 1 diabetes mellitus with other diabetic kidney complication: Secondary | ICD-10-CM | POA: Diagnosis not present

## 2016-08-12 DIAGNOSIS — N186 End stage renal disease: Secondary | ICD-10-CM | POA: Diagnosis not present

## 2016-08-12 DIAGNOSIS — N2581 Secondary hyperparathyroidism of renal origin: Secondary | ICD-10-CM | POA: Diagnosis not present

## 2016-08-13 ENCOUNTER — Ambulatory Visit: Payer: Medicare Other | Admitting: Endocrinology

## 2016-08-14 DIAGNOSIS — N186 End stage renal disease: Secondary | ICD-10-CM | POA: Diagnosis not present

## 2016-08-14 DIAGNOSIS — E1029 Type 1 diabetes mellitus with other diabetic kidney complication: Secondary | ICD-10-CM | POA: Diagnosis not present

## 2016-08-14 DIAGNOSIS — N2581 Secondary hyperparathyroidism of renal origin: Secondary | ICD-10-CM | POA: Diagnosis not present

## 2016-08-14 DIAGNOSIS — D631 Anemia in chronic kidney disease: Secondary | ICD-10-CM | POA: Diagnosis not present

## 2016-08-15 ENCOUNTER — Ambulatory Visit: Payer: Medicare Other | Admitting: Endocrinology

## 2016-08-16 DIAGNOSIS — E1029 Type 1 diabetes mellitus with other diabetic kidney complication: Secondary | ICD-10-CM | POA: Diagnosis not present

## 2016-08-16 DIAGNOSIS — N186 End stage renal disease: Secondary | ICD-10-CM | POA: Diagnosis not present

## 2016-08-16 DIAGNOSIS — D631 Anemia in chronic kidney disease: Secondary | ICD-10-CM | POA: Diagnosis not present

## 2016-08-16 DIAGNOSIS — N2581 Secondary hyperparathyroidism of renal origin: Secondary | ICD-10-CM | POA: Diagnosis not present

## 2016-08-18 ENCOUNTER — Encounter: Payer: Self-pay | Admitting: Physician Assistant

## 2016-08-18 ENCOUNTER — Ambulatory Visit (INDEPENDENT_AMBULATORY_CARE_PROVIDER_SITE_OTHER): Payer: Medicare Other | Admitting: Physician Assistant

## 2016-08-18 VITALS — BP 105/63 | HR 63 | Temp 97.5°F | Resp 20 | Ht 66.5 in | Wt 218.0 lb

## 2016-08-18 DIAGNOSIS — N186 End stage renal disease: Secondary | ICD-10-CM

## 2016-08-18 NOTE — Progress Notes (Signed)
POST OPERATIVE OFFICE NOTE    CC:  F/u for surgery  HPI:  This is a 61 y.o. male who is s/p LUA AVG by Dr. Bridgett Larsson on 07/07/16.  He presents today for follow up. He is currently on dialysis and is dialyzing via left IJ tunneled dialysis catheter that was placed by interventional radiology on 05/11/16.  He states he has done well since surgery.  He denies any pain in his hand.  Pt is on coumadin for paroxysmal Afib.   Allergies  Allergen Reactions  . Lipitor [Atorvastatin]     Leg pain  . Metoprolol     Headaches   . Iodinated Diagnostic Agents Rash    Other Reaction: burning to mouth, swelling of lips.    Current Outpatient Prescriptions  Medication Sig Dispense Refill  . acetaminophen (TYLENOL) 500 MG tablet Take 1,000 mg by mouth every 6 (six) hours as needed for moderate pain or headache.    . albuterol (PROVENTIL HFA;VENTOLIN HFA) 108 (90 Base) MCG/ACT inhaler Inhale 1-2 puffs into the lungs every 6 (six) hours as needed for wheezing or shortness of breath.    Marland Kitchen amLODipine (NORVASC) 2.5 MG tablet Take 2.5 mg by mouth daily.    . calcitRIOL (ROCALTROL) 0.5 MCG capsule Take 0.5 mcg by mouth 4 (four) times daily.     . calcium acetate (PHOSLO) 667 MG capsule Take 1,334 mg by mouth 3 (three) times daily with meals.    . calcium carbonate (OS-CAL - DOSED IN MG OF ELEMENTAL CALCIUM) 1250 (500 Ca) MG tablet Take 1 tablet by mouth daily with breakfast.    . carvedilol (COREG) 25 MG tablet Take 25 mg by mouth 2 (two) times daily.    . colchicine 0.6 MG tablet Take 1 tablet (0.6 mg total) by mouth every 14 (fourteen) days.    . isosorbide mononitrate (IMDUR) 30 MG 24 hr tablet Take 1 tablet (30 mg total) by mouth daily. 30 tablet 0  . oxyCODONE-acetaminophen (ROXICET) 5-325 MG tablet Take 1 tablet by mouth every 6 (six) hours as needed for moderate pain. 10 tablet 0  . predniSONE (DELTASONE) 50 MG tablet One tablet (50mg ) 13 hours prior to procedure; one tablet (50mg ) 7 hours prior to  procedure and then one tablet (50 mg) one hour prior to procedure. 3 tablet 0  . repaglinide (PRANDIN) 0.5 MG tablet Take 1 tablet (0.5 mg total) by mouth 2 (two) times daily before a meal. (Patient taking differently: Take 0.5 mg by mouth 2 (two) times daily. ) 60 tablet 11  . VELPHORO 500 MG chewable tablet Chew 500 mg by mouth 4 (four) times daily.    Marland Kitchen warfarin (COUMADIN) 10 MG tablet Take 10 mg by mouth as directed. Take 10 mgs daily on Tuesdays and Thursdays    . warfarin (COUMADIN) 7.5 MG tablet Take 1 tablet (7.5 mg total) by mouth daily at 6 PM. (Patient taking differently: Take 7.5 mg by mouth as directed. Take 7.5 mgs daily on Monday, Wednesday, Friday, Saturday, and Sunday) 7 tablet 0   No current facility-administered medications for this visit.      ROS:  See HPI  Physical Exam:  Vital Signs: Vitals:   08/18/16 1415  BP: 105/63  Pulse: 63  Resp: 20  Temp: 97.5 F (36.4 C)    Incision:  His incisions have healed nicley Extremities:  There is a good thrill/bruit over the graft; strong hand grip.  Unable to palpate left radial pulse   Assessment/Plan:  This  is a 61 y.o. male who is s/p: Left upper arm AV graft placed by Dr. Bridgett Larsson on 07/07/16  -pt LUA AVG is patent; it is easily palpable -graft is ready to use, however, pt wants to start using graft next Tuesday 08/26/16.  I explained to him the risk of infection with the catheter and the sooner the graft is used, the sooner the catheter can be removed by interventional radiology. -the pt will return to clinic as needed.    Leontine Locket, PA-C [facility] [phone]  Clinic MD:  Trula Slade

## 2016-08-19 ENCOUNTER — Telehealth: Payer: Self-pay | Admitting: *Deleted

## 2016-08-19 DIAGNOSIS — D631 Anemia in chronic kidney disease: Secondary | ICD-10-CM | POA: Diagnosis not present

## 2016-08-19 DIAGNOSIS — E1029 Type 1 diabetes mellitus with other diabetic kidney complication: Secondary | ICD-10-CM | POA: Diagnosis not present

## 2016-08-19 DIAGNOSIS — N2581 Secondary hyperparathyroidism of renal origin: Secondary | ICD-10-CM | POA: Diagnosis not present

## 2016-08-19 DIAGNOSIS — I483 Typical atrial flutter: Secondary | ICD-10-CM

## 2016-08-19 DIAGNOSIS — Z01812 Encounter for preprocedural laboratory examination: Secondary | ICD-10-CM

## 2016-08-19 DIAGNOSIS — N186 End stage renal disease: Secondary | ICD-10-CM | POA: Diagnosis not present

## 2016-08-19 NOTE — Telephone Encounter (Signed)
Trying to schedule pt to bee seen on 2/15, but schedule is not open.  Pt advised I would call him back by the end of the week to arrange H&P OV w/ Camnitz close to procedure.  Pt is agreeable to plan.

## 2016-08-19 NOTE — Telephone Encounter (Signed)
New Message    Returning you call for procedure

## 2016-08-19 NOTE — Telephone Encounter (Signed)
lmtcb to review/discuss AFlutter ablation instructions

## 2016-08-19 NOTE — Telephone Encounter (Signed)
Scheduled for 2/15.  We will review procedure instructions at this OV.  Pt is aware and agreeable to plan.

## 2016-08-21 DIAGNOSIS — E1029 Type 1 diabetes mellitus with other diabetic kidney complication: Secondary | ICD-10-CM | POA: Diagnosis not present

## 2016-08-21 DIAGNOSIS — N2581 Secondary hyperparathyroidism of renal origin: Secondary | ICD-10-CM | POA: Diagnosis not present

## 2016-08-21 DIAGNOSIS — N186 End stage renal disease: Secondary | ICD-10-CM | POA: Diagnosis not present

## 2016-08-21 DIAGNOSIS — I4891 Unspecified atrial fibrillation: Secondary | ICD-10-CM | POA: Diagnosis not present

## 2016-08-21 DIAGNOSIS — D631 Anemia in chronic kidney disease: Secondary | ICD-10-CM | POA: Diagnosis not present

## 2016-08-22 ENCOUNTER — Encounter: Payer: Self-pay | Admitting: *Deleted

## 2016-08-22 NOTE — Telephone Encounter (Signed)
Letter of instructions mailed to patient's home address

## 2016-08-23 DIAGNOSIS — N2581 Secondary hyperparathyroidism of renal origin: Secondary | ICD-10-CM | POA: Diagnosis not present

## 2016-08-23 DIAGNOSIS — N186 End stage renal disease: Secondary | ICD-10-CM | POA: Diagnosis not present

## 2016-08-23 DIAGNOSIS — E1029 Type 1 diabetes mellitus with other diabetic kidney complication: Secondary | ICD-10-CM | POA: Diagnosis not present

## 2016-08-23 DIAGNOSIS — D631 Anemia in chronic kidney disease: Secondary | ICD-10-CM | POA: Diagnosis not present

## 2016-08-26 DIAGNOSIS — N2581 Secondary hyperparathyroidism of renal origin: Secondary | ICD-10-CM | POA: Diagnosis not present

## 2016-08-26 DIAGNOSIS — N186 End stage renal disease: Secondary | ICD-10-CM | POA: Diagnosis not present

## 2016-08-26 DIAGNOSIS — D631 Anemia in chronic kidney disease: Secondary | ICD-10-CM | POA: Diagnosis not present

## 2016-08-26 DIAGNOSIS — E1029 Type 1 diabetes mellitus with other diabetic kidney complication: Secondary | ICD-10-CM | POA: Diagnosis not present

## 2016-08-27 ENCOUNTER — Encounter: Payer: Self-pay | Admitting: *Deleted

## 2016-08-27 DIAGNOSIS — Z992 Dependence on renal dialysis: Secondary | ICD-10-CM | POA: Diagnosis not present

## 2016-08-27 DIAGNOSIS — T8610 Unspecified complication of kidney transplant: Secondary | ICD-10-CM | POA: Diagnosis not present

## 2016-08-27 DIAGNOSIS — N186 End stage renal disease: Secondary | ICD-10-CM | POA: Diagnosis not present

## 2016-08-27 NOTE — Telephone Encounter (Signed)
Informed pt procedure time moved back on 2/28.  Arrive to hospital at 10:30 a.m. for a 12:30 procedure time. Will give him an updated letter, with correct time, when he comes in on 2/15 to see Camnitz for H&P. Patient verbalized understanding and agreeable to plan.

## 2016-08-30 DIAGNOSIS — N2581 Secondary hyperparathyroidism of renal origin: Secondary | ICD-10-CM | POA: Diagnosis not present

## 2016-08-30 DIAGNOSIS — Z23 Encounter for immunization: Secondary | ICD-10-CM | POA: Diagnosis not present

## 2016-08-30 DIAGNOSIS — E1029 Type 1 diabetes mellitus with other diabetic kidney complication: Secondary | ICD-10-CM | POA: Diagnosis not present

## 2016-08-30 DIAGNOSIS — D631 Anemia in chronic kidney disease: Secondary | ICD-10-CM | POA: Diagnosis not present

## 2016-08-30 DIAGNOSIS — N186 End stage renal disease: Secondary | ICD-10-CM | POA: Diagnosis not present

## 2016-09-02 DIAGNOSIS — D631 Anemia in chronic kidney disease: Secondary | ICD-10-CM | POA: Diagnosis not present

## 2016-09-02 DIAGNOSIS — N2581 Secondary hyperparathyroidism of renal origin: Secondary | ICD-10-CM | POA: Diagnosis not present

## 2016-09-02 DIAGNOSIS — E1029 Type 1 diabetes mellitus with other diabetic kidney complication: Secondary | ICD-10-CM | POA: Diagnosis not present

## 2016-09-02 DIAGNOSIS — N186 End stage renal disease: Secondary | ICD-10-CM | POA: Diagnosis not present

## 2016-09-02 DIAGNOSIS — Z23 Encounter for immunization: Secondary | ICD-10-CM | POA: Diagnosis not present

## 2016-09-04 DIAGNOSIS — Z23 Encounter for immunization: Secondary | ICD-10-CM | POA: Diagnosis not present

## 2016-09-04 DIAGNOSIS — D631 Anemia in chronic kidney disease: Secondary | ICD-10-CM | POA: Diagnosis not present

## 2016-09-04 DIAGNOSIS — N186 End stage renal disease: Secondary | ICD-10-CM | POA: Diagnosis not present

## 2016-09-04 DIAGNOSIS — E1029 Type 1 diabetes mellitus with other diabetic kidney complication: Secondary | ICD-10-CM | POA: Diagnosis not present

## 2016-09-04 DIAGNOSIS — N2581 Secondary hyperparathyroidism of renal origin: Secondary | ICD-10-CM | POA: Diagnosis not present

## 2016-09-06 DIAGNOSIS — Z23 Encounter for immunization: Secondary | ICD-10-CM | POA: Diagnosis not present

## 2016-09-06 DIAGNOSIS — N186 End stage renal disease: Secondary | ICD-10-CM | POA: Diagnosis not present

## 2016-09-06 DIAGNOSIS — E1029 Type 1 diabetes mellitus with other diabetic kidney complication: Secondary | ICD-10-CM | POA: Diagnosis not present

## 2016-09-06 DIAGNOSIS — D631 Anemia in chronic kidney disease: Secondary | ICD-10-CM | POA: Diagnosis not present

## 2016-09-06 DIAGNOSIS — N2581 Secondary hyperparathyroidism of renal origin: Secondary | ICD-10-CM | POA: Diagnosis not present

## 2016-09-09 DIAGNOSIS — N186 End stage renal disease: Secondary | ICD-10-CM | POA: Diagnosis not present

## 2016-09-09 DIAGNOSIS — Z23 Encounter for immunization: Secondary | ICD-10-CM | POA: Diagnosis not present

## 2016-09-09 DIAGNOSIS — E1029 Type 1 diabetes mellitus with other diabetic kidney complication: Secondary | ICD-10-CM | POA: Diagnosis not present

## 2016-09-09 DIAGNOSIS — N2581 Secondary hyperparathyroidism of renal origin: Secondary | ICD-10-CM | POA: Diagnosis not present

## 2016-09-09 DIAGNOSIS — D631 Anemia in chronic kidney disease: Secondary | ICD-10-CM | POA: Diagnosis not present

## 2016-09-10 DIAGNOSIS — N186 End stage renal disease: Secondary | ICD-10-CM | POA: Diagnosis not present

## 2016-09-10 DIAGNOSIS — Z992 Dependence on renal dialysis: Secondary | ICD-10-CM | POA: Diagnosis not present

## 2016-09-10 DIAGNOSIS — I871 Compression of vein: Secondary | ICD-10-CM | POA: Diagnosis not present

## 2016-09-10 DIAGNOSIS — T82858A Stenosis of vascular prosthetic devices, implants and grafts, initial encounter: Secondary | ICD-10-CM | POA: Diagnosis not present

## 2016-09-11 ENCOUNTER — Ambulatory Visit (INDEPENDENT_AMBULATORY_CARE_PROVIDER_SITE_OTHER): Payer: Medicare Other | Admitting: Cardiology

## 2016-09-11 ENCOUNTER — Other Ambulatory Visit: Payer: Self-pay | Admitting: *Deleted

## 2016-09-11 ENCOUNTER — Encounter: Payer: Self-pay | Admitting: Cardiology

## 2016-09-11 VITALS — BP 85/58 | HR 85 | Ht 66.5 in | Wt 215.8 lb

## 2016-09-11 DIAGNOSIS — N2581 Secondary hyperparathyroidism of renal origin: Secondary | ICD-10-CM | POA: Diagnosis not present

## 2016-09-11 DIAGNOSIS — I483 Typical atrial flutter: Secondary | ICD-10-CM | POA: Diagnosis not present

## 2016-09-11 DIAGNOSIS — D631 Anemia in chronic kidney disease: Secondary | ICD-10-CM | POA: Diagnosis not present

## 2016-09-11 DIAGNOSIS — E1029 Type 1 diabetes mellitus with other diabetic kidney complication: Secondary | ICD-10-CM | POA: Diagnosis not present

## 2016-09-11 DIAGNOSIS — Z23 Encounter for immunization: Secondary | ICD-10-CM | POA: Diagnosis not present

## 2016-09-11 DIAGNOSIS — N186 End stage renal disease: Secondary | ICD-10-CM | POA: Diagnosis not present

## 2016-09-11 NOTE — Patient Instructions (Signed)
Medication Instructions:    Your physician recommends that you continue on your current medications as directed. Please refer to the Current Medication list given to you today.  --- If you need a refill on your cardiac medications before your next appointment, please call your pharmacy. ---  Labwork:  None ordered  Testing/Procedures:  None ordered  Follow-Up:  Your physician recommends that you schedule a follow-up appointment in: 4 weeks, after your procedure on 09/24/16, with Dr. Curt Bears.   Thank you for choosing [facility]!!   Trinidad Curet, RN [phone]

## 2016-09-11 NOTE — Progress Notes (Signed)
Electrophysiology Office Note   Date:  09/11/2016   ID:  Patrick [name], DOB 1955-09-29, MRN [id number]  PCP:  Philis Fendt, MD  Cardiologist:  Einar Gip Primary Electrophysiologist:  Constance Haw, MD    Chief Complaint  Patient presents with  . Follow-up    typical AFlutter     History of Present Illness: Patrick [name] is a 61 y.o. male who presents today for electrophysiology evaluation.   He presented to the hospital in atrial flutter in October. Plan for ablation 09/24/16.He is feeling weak and tired today, but he is just out of dialysis. This is a normal-appearing for him post dialysis. Otherwise he is doing well without major complaint.   Today, he denies symptoms of palpitations, chest pain, shortness of breath, orthopnea, PND, lower extremity edema, claudication, dizziness, presyncope, syncope, bleeding, or neurologic sequela. The patient is tolerating medications without difficulties and is otherwise without complaint today.    Past Medical History:  Diagnosis Date  . Arthritis   . Diabetes mellitus without complication ([place])    type 2  . Dysrhythmia    pt. told that he has a "flutter"   . End-stage renal failure with renal transplant ([place])    SE- on T,TH & SAt  . Gout   . History of blood transfusion 2016  . History of kidney stones    treated with nephrectomy  . Hypertension   . Impotence of organic origin   . Myocardial infarction    '96  . Pneumonia    " a touch & I was in the hosp."  . Secondary hyperparathyroidism ([place])   . Type I (juvenile type) diabetes mellitus with renal manifestations, not stated as uncontrolled(250.41)    Pt. TYPE 2, not type one    Past Surgical History:  Procedure Laterality Date  . APPENDECTOMY    . AV FISTULA PLACEMENT Right 09/18/2014   Procedure: INSERTION OF ARTERIOVENOUS (AV) GORE-TEX GRAFT ARM USING 4-7MM  X 45CM STRETCH GORE-TEX VASCULAR GRAFT;  Surgeon: Rosetta Posner, MD;  Location: [facility];  Service: Vascular;   Laterality: Right;  . AV FISTULA PLACEMENT Left 07/07/2016   Procedure: INSERTION OF LEFT BRACHIAL TO AXILLARY ARTERIOVENOUS (AV) GORE-TEX ARM GRAFT;  Surgeon: Conrad Orient, MD;  Location: [facility];  Service: Vascular;  Laterality: Left;  . birth defect heart surgery  1996  . HERNIA REPAIR     with nephrectomy  . IR GENERIC HISTORICAL  05/11/2016   IR FLUORO GUIDE CV LINE LEFT 05/11/2016 Corrie Mckusick, DO [facility]  . IR GENERIC HISTORICAL  05/11/2016   IR US GUIDE VASC ACCESS LEFT 05/11/2016 Corrie Mckusick, DO [facility]  . IR GENERIC HISTORICAL  05/11/2016   IR US GUIDE VASC ACCESS RIGHT 05/11/2016 Corrie Mckusick, DO [facility]  . IR GENERIC HISTORICAL  05/11/2016   IR RADIOLOGY PERIPHERAL GUIDED IV START 05/11/2016 Corrie Mckusick, DO [facility]  . KIDNEY SURGERY     transplant rejected   . KIDNEY TRANSPLANT  2009  . PERIPHERAL VASCULAR CATHETERIZATION N/A 06/04/2016   Procedure: Upper Extremity Venography;  Surgeon: Waynetta Sandy, MD;  Location: [facility];  Service: Cardiovascular;  Laterality: N/A;     Current Outpatient Prescriptions  Medication Sig Dispense Refill  . acetaminophen (TYLENOL) 500 MG tablet Take 1,000 mg by mouth every 6 (six) hours as needed for moderate pain or headache.    . albuterol (PROVENTIL HFA;VENTOLIN HFA) 108 (90 Base) MCG/ACT inhaler Inhale 1-2 puffs into the  lungs every 6 (six) hours as needed for wheezing or shortness of breath.    Marland Kitchen amLODipine (NORVASC) 2.5 MG tablet Take 2.5 mg by mouth daily.    . calcium acetate (PHOSLO) 667 MG capsule Take 1,334 mg by mouth 3 (three) times daily with meals.    . calcium carbonate (OS-CAL - DOSED IN MG OF ELEMENTAL CALCIUM) 1250 (500 Ca) MG tablet Take 1 tablet by mouth daily with breakfast.    . carvedilol (COREG) 25 MG tablet Take 25 mg by mouth 2 (two) times daily.    . colchicine 0.6 MG tablet Take 1 tablet (0.6 mg total) by mouth every 14 (fourteen) days.    . isosorbide mononitrate  (IMDUR) 30 MG 24 hr tablet Take 1 tablet (30 mg total) by mouth daily. 30 tablet 0  . oxyCODONE-acetaminophen (ROXICET) 5-325 MG tablet Take 1 tablet by mouth every 6 (six) hours as needed for moderate pain. 10 tablet 0  . predniSONE (DELTASONE) 50 MG tablet One tablet (50mg ) 13 hours prior to procedure; one tablet (50mg ) 7 hours prior to procedure and then one tablet (50 mg) one hour prior to procedure. 3 tablet 0  . repaglinide (PRANDIN) 0.5 MG tablet Take 1 tablet (0.5 mg total) by mouth 2 (two) times daily before a meal. (Patient taking differently: Take 0.5 mg by mouth 2 (two) times daily. ) 60 tablet 11  . VELPHORO 500 MG chewable tablet Chew 500 mg by mouth 4 (four) times daily.    Marland Kitchen warfarin (COUMADIN) 10 MG tablet Take 10 mg by mouth as directed. Take 10 mgs daily on Tuesdays and Thursdays    . warfarin (COUMADIN) 7.5 MG tablet Take 1 tablet (7.5 mg total) by mouth daily at 6 PM. (Patient taking differently: Take 7.5 mg by mouth as directed. Take 7.5 mgs daily on Monday, Wednesday, Friday, Saturday, and Sunday) 7 tablet 0   No current facility-administered medications for this visit.     Allergies:   Lipitor [atorvastatin]; Metoprolol; and Iodinated diagnostic agents   Social History:  The patient  reports that he quit smoking about 2 years ago. He has never used smokeless tobacco. He reports that he drinks alcohol. He reports that he does not use drugs.   Family History:  The patient's family history includes Hyperlipidemia in his mother; Hypertension in his mother.    ROS:  Please see the history of present illness.   Otherwise, review of systems is positive for palpitations.   All other systems are reviewed and negative.    PHYSICAL EXAM: VS:  BP (!) 85/58   Pulse 85   Ht 5' 6.5" (1.689 m)   Wt 215 lb 12.8 oz (97.9 kg)   BMI 34.31 kg/m  , BMI Body mass index is 34.31 kg/m. GEN: Well nourished, well developed, in no acute distress  HEENT: normal  Neck: no JVD, carotid  bruits, or masses Cardiac: RRR; no murmurs, rubs, or gallops,no edema, decreased lower extremity pulses  Respiratory:  clear to auscultation bilaterally, normal work of breathing GI: soft, nontender, nondistended, + BS MS: no deformity or atrophy  Skin: warm and dry Neuro:  Strength and sensation are intact Psych: euthymic mood, full affect  EKG:  EKG is not ordered today. Personal review of the ekg ordered 06/23/16 shows sinus rhythm, lateral TWI  Recent Labs: 01/22/2016: B Natriuretic Peptide >4,500.0; Magnesium 1.8 01/23/2016: ALT 8 05/25/2016: TSH 5.113 05/26/2016: Platelets 123 06/04/2016: BUN 54; BUN 52; Creatinine, Ser 12.40; Creatinine, Ser 12.80 07/07/2016: Hemoglobin  10.2; Potassium 5.7; Sodium 136    Lipid Panel     Component Value Date/Time   CHOL 192 05/25/2016 1542   TRIG 249 (H) 05/25/2016 1542   HDL 34 (L) 05/25/2016 1542   CHOLHDL 5.6 05/25/2016 1542   VLDL 50 (H) 05/25/2016 1542   LDLCALC 108 (H) 05/25/2016 1542     Wt Readings from Last 3 Encounters:  09/11/16 215 lb 12.8 oz (97.9 kg)  08/18/16 218 lb (98.9 kg)  07/16/16 218 lb 3.2 oz (99 kg)      Other studies Reviewed: Additional studies/ records that were reviewed today include: TTE 01/25/16  Review of the above records today demonstrates:  - Left ventricle: There was mild concentric hypertrophy. Systolic   function was mildly reduced. The estimated ejection fraction was   in the range of 45% to 50%. Diffuse hypokinesis. Features are   consistent with a pseudonormal left ventricular filling pattern,   with concomitant abnormal relaxation and increased filling   pressure (grade 2 diastolic dysfunction). Doppler parameters are   consistent with both elevated ventricular end-diastolic filling   pressure and elevated left atrial filling pressure. - Aortic valve: Normal-sized, mildly calcified annulus. Trileaflet.   Moderate calcification involving the left coronary and   noncoronary cusp. There was  very mild stenosis. Mean gradient   (S): 13 mm Hg. Peak gradient (S): 26 mm Hg. Valve area (VTI):   1.02 cm^2. Valve area (Vmax): 0.86 cm^2. Valve area (Vmean): 0.9   cm^2. - Mitral valve: Calcified annulus. There was mild regurgitation.   Valve area by continuity equation (using LVOT flow): 1.35 cm^2. - Left atrium: The atrium was mildly dilated. - Atrial septum: No defect or patent foramen ovale was identified.   ASSESSMENT AND PLAN:  1.  Atrial flutter: On Coumadin. Plan for ablation 09/24/16 I discussed the risks and benefits of ablation. Risks include bleeding, tamponade, heart block, stroke, among others.  We Aylani Spurlock continue his Coumadin at the current dosage as monitored by his dialysis center.  2. Hypertension: Low today, but he is recently had dialysis. He says that his blood pressure has been well managed. We'll make no changes today.  Current medicines are reviewed at length with the patient today.   The patient does not have concerns regarding his medicines.  The following changes were made today:  none  Labs/ tests ordered today include:  No orders of the defined types were placed in this encounter.    Disposition:   FU with Deretha Ertle 2 months  Signed, Dwaine Pringle Meredith Leeds, MD  09/11/2016 12:23 PM     [facility] [address] [place] [address] [phone] (office) [phone] (fax)

## 2016-09-12 ENCOUNTER — Ambulatory Visit: Payer: Medicare Other | Admitting: Endocrinology

## 2016-09-13 DIAGNOSIS — N186 End stage renal disease: Secondary | ICD-10-CM | POA: Diagnosis not present

## 2016-09-13 DIAGNOSIS — E1029 Type 1 diabetes mellitus with other diabetic kidney complication: Secondary | ICD-10-CM | POA: Diagnosis not present

## 2016-09-13 DIAGNOSIS — D631 Anemia in chronic kidney disease: Secondary | ICD-10-CM | POA: Diagnosis not present

## 2016-09-13 DIAGNOSIS — Z23 Encounter for immunization: Secondary | ICD-10-CM | POA: Diagnosis not present

## 2016-09-13 DIAGNOSIS — N2581 Secondary hyperparathyroidism of renal origin: Secondary | ICD-10-CM | POA: Diagnosis not present

## 2016-09-16 DIAGNOSIS — D631 Anemia in chronic kidney disease: Secondary | ICD-10-CM | POA: Diagnosis not present

## 2016-09-16 DIAGNOSIS — E1029 Type 1 diabetes mellitus with other diabetic kidney complication: Secondary | ICD-10-CM | POA: Diagnosis not present

## 2016-09-16 DIAGNOSIS — N186 End stage renal disease: Secondary | ICD-10-CM | POA: Diagnosis not present

## 2016-09-16 DIAGNOSIS — Z23 Encounter for immunization: Secondary | ICD-10-CM | POA: Diagnosis not present

## 2016-09-16 DIAGNOSIS — N2581 Secondary hyperparathyroidism of renal origin: Secondary | ICD-10-CM | POA: Diagnosis not present

## 2016-09-17 DIAGNOSIS — Z23 Encounter for immunization: Secondary | ICD-10-CM | POA: Diagnosis not present

## 2016-09-17 DIAGNOSIS — N186 End stage renal disease: Secondary | ICD-10-CM | POA: Diagnosis not present

## 2016-09-17 DIAGNOSIS — D631 Anemia in chronic kidney disease: Secondary | ICD-10-CM | POA: Diagnosis not present

## 2016-09-17 DIAGNOSIS — N2581 Secondary hyperparathyroidism of renal origin: Secondary | ICD-10-CM | POA: Diagnosis not present

## 2016-09-17 DIAGNOSIS — E1029 Type 1 diabetes mellitus with other diabetic kidney complication: Secondary | ICD-10-CM | POA: Diagnosis not present

## 2016-09-20 DIAGNOSIS — N186 End stage renal disease: Secondary | ICD-10-CM | POA: Diagnosis not present

## 2016-09-20 DIAGNOSIS — D631 Anemia in chronic kidney disease: Secondary | ICD-10-CM | POA: Diagnosis not present

## 2016-09-20 DIAGNOSIS — I4891 Unspecified atrial fibrillation: Secondary | ICD-10-CM | POA: Diagnosis not present

## 2016-09-20 DIAGNOSIS — N2581 Secondary hyperparathyroidism of renal origin: Secondary | ICD-10-CM | POA: Diagnosis not present

## 2016-09-20 DIAGNOSIS — E1029 Type 1 diabetes mellitus with other diabetic kidney complication: Secondary | ICD-10-CM | POA: Diagnosis not present

## 2016-09-20 DIAGNOSIS — Z23 Encounter for immunization: Secondary | ICD-10-CM | POA: Diagnosis not present

## 2016-09-23 DIAGNOSIS — N186 End stage renal disease: Secondary | ICD-10-CM | POA: Diagnosis not present

## 2016-09-23 DIAGNOSIS — D631 Anemia in chronic kidney disease: Secondary | ICD-10-CM | POA: Diagnosis not present

## 2016-09-23 DIAGNOSIS — E1029 Type 1 diabetes mellitus with other diabetic kidney complication: Secondary | ICD-10-CM | POA: Diagnosis not present

## 2016-09-23 DIAGNOSIS — N2581 Secondary hyperparathyroidism of renal origin: Secondary | ICD-10-CM | POA: Diagnosis not present

## 2016-09-23 DIAGNOSIS — Z23 Encounter for immunization: Secondary | ICD-10-CM | POA: Diagnosis not present

## 2016-09-24 ENCOUNTER — Encounter (HOSPITAL_COMMUNITY): Payer: Self-pay | Admitting: Certified Registered"

## 2016-09-24 ENCOUNTER — Encounter (HOSPITAL_COMMUNITY)
Admission: RE | Disposition: A | Discharge status: Home / Self Care | Payer: Self-pay | Source: Ambulatory Visit | Attending: Cardiology

## 2016-09-24 ENCOUNTER — Ambulatory Visit (HOSPITAL_COMMUNITY): Payer: Medicare Other | Admitting: Certified Registered"

## 2016-09-24 ENCOUNTER — Ambulatory Visit (HOSPITAL_COMMUNITY)
Admission: RE | Admit: 2016-09-24 | Discharge: 2016-09-25 | Disposition: A | Discharge status: Home / Self Care | Payer: Medicare Other | Source: Ambulatory Visit | Attending: Cardiology | Admitting: Cardiology

## 2016-09-24 DIAGNOSIS — I120 Hypertensive chronic kidney disease with stage 5 chronic kidney disease or end stage renal disease: Secondary | ICD-10-CM | POA: Diagnosis not present

## 2016-09-24 DIAGNOSIS — Z8249 Family history of ischemic heart disease and other diseases of the circulatory system: Secondary | ICD-10-CM | POA: Insufficient documentation

## 2016-09-24 DIAGNOSIS — Z888 Allergy status to other drugs, medicaments and biological substances status: Secondary | ICD-10-CM | POA: Diagnosis not present

## 2016-09-24 DIAGNOSIS — I483 Typical atrial flutter: Secondary | ICD-10-CM | POA: Diagnosis present

## 2016-09-24 DIAGNOSIS — N186 End stage renal disease: Secondary | ICD-10-CM | POA: Diagnosis not present

## 2016-09-24 DIAGNOSIS — I4891 Unspecified atrial fibrillation: Secondary | ICD-10-CM | POA: Insufficient documentation

## 2016-09-24 DIAGNOSIS — M1990 Unspecified osteoarthritis, unspecified site: Secondary | ICD-10-CM | POA: Diagnosis not present

## 2016-09-24 DIAGNOSIS — Z7901 Long term (current) use of anticoagulants: Secondary | ICD-10-CM | POA: Insufficient documentation

## 2016-09-24 DIAGNOSIS — Z87442 Personal history of urinary calculi: Secondary | ICD-10-CM | POA: Insufficient documentation

## 2016-09-24 DIAGNOSIS — N2581 Secondary hyperparathyroidism of renal origin: Secondary | ICD-10-CM | POA: Diagnosis not present

## 2016-09-24 DIAGNOSIS — I4892 Unspecified atrial flutter: Secondary | ICD-10-CM | POA: Diagnosis not present

## 2016-09-24 DIAGNOSIS — E1122 Type 2 diabetes mellitus with diabetic chronic kidney disease: Secondary | ICD-10-CM | POA: Diagnosis not present

## 2016-09-24 DIAGNOSIS — I471 Supraventricular tachycardia: Secondary | ICD-10-CM | POA: Insufficient documentation

## 2016-09-24 DIAGNOSIS — I2510 Atherosclerotic heart disease of native coronary artery without angina pectoris: Secondary | ICD-10-CM | POA: Diagnosis not present

## 2016-09-24 DIAGNOSIS — Z940 Kidney transplant status: Secondary | ICD-10-CM | POA: Insufficient documentation

## 2016-09-24 DIAGNOSIS — Z992 Dependence on renal dialysis: Secondary | ICD-10-CM | POA: Insufficient documentation

## 2016-09-24 DIAGNOSIS — Z79899 Other long term (current) drug therapy: Secondary | ICD-10-CM | POA: Insufficient documentation

## 2016-09-24 DIAGNOSIS — I252 Old myocardial infarction: Secondary | ICD-10-CM | POA: Diagnosis not present

## 2016-09-24 DIAGNOSIS — M109 Gout, unspecified: Secondary | ICD-10-CM | POA: Insufficient documentation

## 2016-09-24 DIAGNOSIS — T8610 Unspecified complication of kidney transplant: Secondary | ICD-10-CM | POA: Diagnosis not present

## 2016-09-24 DIAGNOSIS — Z87891 Personal history of nicotine dependence: Secondary | ICD-10-CM | POA: Insufficient documentation

## 2016-09-24 DIAGNOSIS — I480 Paroxysmal atrial fibrillation: Secondary | ICD-10-CM | POA: Diagnosis not present

## 2016-09-24 DIAGNOSIS — R002 Palpitations: Secondary | ICD-10-CM | POA: Diagnosis not present

## 2016-09-24 DIAGNOSIS — Z91041 Radiographic dye allergy status: Secondary | ICD-10-CM | POA: Insufficient documentation

## 2016-09-24 HISTORY — DX: End stage renal disease: N18.6

## 2016-09-24 HISTORY — DX: Migraine, unspecified, not intractable, without status migrainosus: G43.909

## 2016-09-24 HISTORY — PX: A-FLUTTER ABLATION: EP1230

## 2016-09-24 HISTORY — DX: Dependence on renal dialysis: Z99.2

## 2016-09-24 HISTORY — DX: Type 2 diabetes mellitus without complications: E11.9

## 2016-09-24 LAB — MRSA PCR SCREENING: MRSA by PCR: NEGATIVE

## 2016-09-24 LAB — CBC
HCT: 39.4 % (ref 39.0–52.0)
HEMOGLOBIN: 12.4 g/dL — AB (ref 13.0–17.0)
MCH: 31.4 pg (ref 26.0–34.0)
MCHC: 31.5 g/dL (ref 30.0–36.0)
MCV: 99.7 fL (ref 78.0–100.0)
Platelets: 147 10*3/uL — ABNORMAL LOW (ref 150–400)
RBC: 3.95 MIL/uL — ABNORMAL LOW (ref 4.22–5.81)
RDW: 17 % — ABNORMAL HIGH (ref 11.5–15.5)
WBC: 7.3 10*3/uL (ref 4.0–10.5)

## 2016-09-24 LAB — BASIC METABOLIC PANEL
Anion gap: 15 (ref 5–15)
BUN: 39 mg/dL — AB (ref 6–20)
CHLORIDE: 99 mmol/L — AB (ref 101–111)
CO2: 24 mmol/L (ref 22–32)
CREATININE: 11.57 mg/dL — AB (ref 0.61–1.24)
Calcium: 9.7 mg/dL (ref 8.9–10.3)
GFR calc Af Amer: 5 mL/min — ABNORMAL LOW (ref >60)
GFR calc non Af Amer: 4 mL/min — ABNORMAL LOW (ref >60)
GLUCOSE: 95 mg/dL (ref 65–99)
Potassium: 5 mmol/L (ref 3.5–5.1)
SODIUM: 138 mmol/L (ref 135–145)

## 2016-09-24 LAB — GLUCOSE, CAPILLARY
Glucose-Capillary: 83 mg/dL (ref 65–99)
Glucose-Capillary: 105 mg/dL — ABNORMAL HIGH (ref 65–99)

## 2016-09-24 LAB — PROTIME-INR
INR: 1.89
PROTHROMBIN TIME: 22 s — AB (ref 11.4–15.2)

## 2016-09-24 SURGERY — A-FLUTTER ABLATION
Anesthesia: General

## 2016-09-24 MED ORDER — FENTANYL CITRATE (PF) 100 MCG/2ML IJ SOLN
INTRAMUSCULAR | Status: DC | PRN
  Administered 2016-09-24: 50 ug via INTRAVENOUS

## 2016-09-24 MED ORDER — CARVEDILOL 25 MG PO TABS
25.0000 mg | ORAL_TABLET | Freq: Two times a day (BID) | ORAL | Status: DC
Start: 2016-09-24 — End: 2016-09-25
  Administered 2016-09-24: 25 mg via ORAL
  Filled 2016-09-24: qty 1

## 2016-09-24 MED ORDER — BUPIVACAINE HCL (PF) 0.25 % IJ SOLN
INTRAMUSCULAR | Status: AC
  Filled 2016-09-24: qty 30

## 2016-09-24 MED ORDER — CALCIUM CARBONATE 1250 (500 CA) MG PO TABS: 1.0000 | ORAL_TABLET | ORAL | Status: DC

## 2016-09-24 MED ORDER — CALCIUM ACETATE (PHOS BINDER) 667 MG PO CAPS
1334.0000 mg | ORAL_CAPSULE | Freq: Three times a day (TID) | ORAL | Status: DC
  Administered 2016-09-24: 1334 mg via ORAL
  Filled 2016-09-24: qty 2

## 2016-09-24 MED ORDER — SODIUM CHLORIDE 0.9 % IV SOLN
INTRAVENOUS | Status: DC | PRN
  Administered 2016-09-24: 12:00:00 via INTRAVENOUS

## 2016-09-24 MED ORDER — SODIUM CHLORIDE 0.9 % IV SOLN: 100.0000 mL | INTRAVENOUS | Status: DC | PRN

## 2016-09-24 MED ORDER — SODIUM CHLORIDE 0.9 % IV SOLN
250.0000 mL | INTRAVENOUS | Status: DC | PRN
Start: 2016-09-24 — End: 2016-09-25

## 2016-09-24 MED ORDER — WARFARIN SODIUM 10 MG PO TABS: 10.0000 mg | ORAL_TABLET | ORAL | Status: DC

## 2016-09-24 MED ORDER — ONDANSETRON HCL 4 MG/2ML IJ SOLN
INTRAMUSCULAR | Status: DC | PRN
  Administered 2016-09-24: 4 mg via INTRAVENOUS

## 2016-09-24 MED ORDER — PHENYLEPHRINE HCL 10 MG/ML IJ SOLN
INTRAVENOUS | Status: DC | PRN
  Administered 2016-09-24: 20 ug/min via INTRAVENOUS

## 2016-09-24 MED ORDER — ACETAMINOPHEN 500 MG PO TABS
1000.0000 mg | ORAL_TABLET | Freq: Four times a day (QID) | ORAL | Status: DC | PRN
  Filled 2016-09-24: qty 2

## 2016-09-24 MED ORDER — SODIUM CHLORIDE 0.9% FLUSH: 3.0000 mL | INTRAVENOUS | Status: DC | PRN

## 2016-09-24 MED ORDER — MEPERIDINE HCL 25 MG/ML IJ SOLN: 6.2500 mg | INTRAMUSCULAR | Status: DC | PRN

## 2016-09-24 MED ORDER — BUPIVACAINE HCL (PF) 0.25 % IJ SOLN
INTRAMUSCULAR | Status: DC | PRN
  Administered 2016-09-24: 35 mL

## 2016-09-24 MED ORDER — WARFARIN - PHARMACIST DOSING INPATIENT: Freq: Every day | Status: DC

## 2016-09-24 MED ORDER — MIDAZOLAM HCL 2 MG/2ML IJ SOLN
INTRAMUSCULAR | Status: DC | PRN
  Administered 2016-09-24: 2 mg via INTRAVENOUS

## 2016-09-24 MED ORDER — HEPARIN (PORCINE) IN NACL 2-0.9 UNIT/ML-% IJ SOLN
INTRAMUSCULAR | Status: AC
  Filled 2016-09-24: qty 500

## 2016-09-24 MED ORDER — HEPARIN SODIUM (PORCINE) 1000 UNIT/ML DIALYSIS: 1000.0000 [IU] | INTRAMUSCULAR | Status: DC | PRN

## 2016-09-24 MED ORDER — ONDANSETRON HCL 4 MG/2ML IJ SOLN: 4.0000 mg | Freq: Four times a day (QID) | INTRAMUSCULAR | Status: DC | PRN

## 2016-09-24 MED ORDER — PROPOFOL 10 MG/ML IV BOLUS
INTRAVENOUS | Status: DC | PRN
  Administered 2016-09-24: 100 mg via INTRAVENOUS
  Administered 2016-09-24: 50 mg via INTRAVENOUS

## 2016-09-24 MED ORDER — COLCHICINE 0.6 MG PO TABS: 0.6000 mg | ORAL_TABLET | ORAL | Status: DC

## 2016-09-24 MED ORDER — HEPARIN (PORCINE) IN NACL 2-0.9 UNIT/ML-% IJ SOLN
INTRAMUSCULAR | Status: DC | PRN
  Administered 2016-09-24: 12:00:00

## 2016-09-24 MED ORDER — ONDANSETRON HCL 4 MG/2ML IJ SOLN: 4.0000 mg | Freq: Once | INTRAMUSCULAR | Status: DC | PRN

## 2016-09-24 MED ORDER — ALBUTEROL SULFATE (2.5 MG/3ML) 0.083% IN NEBU: 3.0000 mL | INHALATION_SOLUTION | Freq: Four times a day (QID) | RESPIRATORY_TRACT | Status: DC | PRN

## 2016-09-24 MED ORDER — LIDOCAINE HCL (CARDIAC) 20 MG/ML IV SOLN
INTRAVENOUS | Status: DC | PRN
  Administered 2016-09-24: 20 mg via INTRAVENOUS

## 2016-09-24 MED ORDER — HYDROMORPHONE HCL 1 MG/ML IJ SOLN: 0.2500 mg | INTRAMUSCULAR | Status: DC | PRN

## 2016-09-24 MED ORDER — ALTEPLASE 2 MG IJ SOLR
2.0000 mg | Freq: Once | INTRAMUSCULAR | Status: DC | PRN
  Filled 2016-09-24: qty 2

## 2016-09-24 MED ORDER — WARFARIN SODIUM 7.5 MG PO TABS
7.5000 mg | ORAL_TABLET | Freq: Every day | ORAL | Status: DC
  Administered 2016-09-24: 7.5 mg via ORAL
  Filled 2016-09-24: qty 1

## 2016-09-24 MED ORDER — REPAGLINIDE 0.5 MG PO TABS
0.5000 mg | ORAL_TABLET | Freq: Two times a day (BID) | ORAL | Status: DC
  Administered 2016-09-24: 0.5 mg via ORAL
  Filled 2016-09-24 (×3): qty 1

## 2016-09-24 MED ORDER — ISOSORBIDE MONONITRATE ER 30 MG PO TB24
30.0000 mg | ORAL_TABLET | Freq: Every day | ORAL | Status: DC
Start: 2016-09-24 — End: 2016-09-25
  Administered 2016-09-24: 30 mg via ORAL
  Filled 2016-09-24: qty 1

## 2016-09-24 MED ORDER — SODIUM CHLORIDE 0.9% FLUSH
3.0000 mL | Freq: Two times a day (BID) | INTRAVENOUS | Status: DC
  Administered 2016-09-24: 3 mL via INTRAVENOUS

## 2016-09-24 MED ORDER — WARFARIN SODIUM 7.5 MG PO TABS: 7.5000 mg | ORAL_TABLET | ORAL | Status: DC

## 2016-09-24 MED ORDER — AMLODIPINE BESYLATE 5 MG PO TABS
2.5000 mg | ORAL_TABLET | Freq: Every day | ORAL | Status: DC
  Filled 2016-09-24 (×2): qty 1

## 2016-09-24 MED ORDER — OXYCODONE-ACETAMINOPHEN 5-325 MG PO TABS
1.0000 | ORAL_TABLET | Freq: Four times a day (QID) | ORAL | Status: DC | PRN
  Administered 2016-09-24 (×2): 1 via ORAL
  Filled 2016-09-24 (×2): qty 1

## 2016-09-24 MED ORDER — SUCCINYLCHOLINE CHLORIDE 20 MG/ML IJ SOLN
INTRAMUSCULAR | Status: DC | PRN
  Administered 2016-09-24: 100 mg via INTRAVENOUS

## 2016-09-24 SURGICAL SUPPLY — 16 items
BAG SNAP BAND KOVER 36X36 (MISCELLANEOUS) ×3 IMPLANT
BLANKET WARM UNDERBOD FULL ACC (MISCELLANEOUS) ×3 IMPLANT
CATH EZ STEER NAV 8MM D-F CUR (ABLATOR) ×3 IMPLANT
CATH JOSEPH QUAD ALLRED 6F REP (CATHETERS) ×3 IMPLANT
CATH QUAD JOSEPHSON 5FR REPROC (CATHETERS) IMPLANT
CATH WEBSTER BI DIR CS D-F CRV (CATHETERS) ×3 IMPLANT
PACK EP LATEX FREE (CUSTOM PROCEDURE TRAY) ×2
PACK EP LF (CUSTOM PROCEDURE TRAY) ×1 IMPLANT
PAD DEFIB LIFELINK (PAD) ×3 IMPLANT
PATCH CARTO3 (PAD) ×3 IMPLANT
SHEATH PINNACLE 6F 10CM (SHEATH) ×3 IMPLANT
SHEATH PINNACLE 7F 10CM (SHEATH) ×3 IMPLANT
SHEATH PINNACLE 8F 10CM (SHEATH) ×3 IMPLANT
SHEATH PINNACLE 9F 10CM (SHEATH) ×3 IMPLANT
SHEATH SWARTZ RAMP 8.5F 60CM (SHEATH) ×3 IMPLANT
SHIELD RADPAD SCOOP 12X17 (MISCELLANEOUS) ×3 IMPLANT

## 2016-09-24 NOTE — Transfer of Care (Signed)
Immediate Anesthesia Transfer of Care Note  Patient: Patrick [name]  Procedure(s) Performed: Procedure(s): A-Flutter Ablation (N/A)  Patient Location: Cath Lab  Anesthesia Type:General  Level of Consciousness: awake, alert  and oriented  Airway & Oxygen Therapy: Patient Spontanous Breathing and Patient connected to nasal cannula oxygen  Post-op Assessment: Report given to RN  Post vital signs: Reviewed and stable  Last Vitals:  Vitals:   09/24/16 1016 09/24/16 1401  BP: 103/65   Pulse: 74   Resp: 18   Temp: 36.3 C 37 C    Last Pain:  Vitals:   09/24/16 1016  TempSrc: Oral      Patients Stated Pain Goal: 3 (89/84/21 0312)  Complications: No apparent anesthesia complications

## 2016-09-24 NOTE — Discharge Instructions (Signed)
No driving for 3 days. No lifting over 5 lbs for 1 week. No sexual activity for 1 week. Keep procedure site clean & dry. If you notice increased pain, swelling, bleeding or pus, call/return! You may shower, but no soaking baths/hot tubs/pools for 1 week. ° °

## 2016-09-24 NOTE — Anesthesia Postprocedure Evaluation (Signed)
Anesthesia Post Note  Patient: Patrick [name]  Procedure(s) Performed: Procedure(s) (LRB): A-Flutter Ablation (N/A)  Patient location during evaluation: PACU Anesthesia Type: General Level of consciousness: awake and alert Pain management: pain level controlled Vital Signs Assessment: post-procedure vital signs reviewed and stable Respiratory status: spontaneous breathing, nonlabored ventilation, respiratory function stable and patient connected to nasal cannula oxygen Cardiovascular status: blood pressure returned to baseline and stable Postop Assessment: no signs of nausea or vomiting Anesthetic complications: no       Last Vitals:  Vitals:   09/24/16 1500 09/24/16 1535  BP: (!) 107/57 (!) 97/30  Pulse: 77 73  Resp: 10 18  Temp:      Last Pain:  Vitals:   09/24/16 1535  TempSrc:   PainSc: 6                  Zaliah Wissner DAVID

## 2016-09-24 NOTE — Progress Notes (Addendum)
ANTICOAGULATION CONSULT NOTE - Initial Consult  Pharmacy Consult for coumadin Indication: a flutter  Allergies  Allergen Reactions  . Lipitor [Atorvastatin]     Leg pain  . Metoprolol     Headaches   . Iodinated Diagnostic Agents Rash    Other Reaction: burning to mouth, swelling of lips.    Patient Measurements: Height: 5' 6.5" (168.9 cm) Weight: 217 lb (98.4 kg) IBW/kg (Calculated) : 64.95   Vital Signs: Temp: 98.6 F (37 C) (02/28 1401) Temp Source: Oral (02/28 1016) BP: 79/60 (02/28 1415) Pulse Rate: 83 (02/28 1415)  Labs:  Recent Labs  09/24/16 1036  HGB 12.4*  HCT 39.4  PLT 147*  LABPROT 22.0*  INR 1.89  CREATININE 11.57*    Estimated Creatinine Clearance: 7.3 mL/min (by C-G formula based on SCr of 11.57 mg/dL (H)).   Medical History: Past Medical History:  Diagnosis Date  . Arthritis   . End-stage renal failure with renal transplant ([place])    SE- on T,TH & SAt  . Gout   . History of blood transfusion 2016  . History of kidney stones    treated with nephrectomy  . Hypertension   . Impotence of organic origin   . Myocardial infarction    '96  . Pneumonia    " a touch & I was in the hosp."  . Secondary hyperparathyroidism ([place])   . Type 2 diabetes mellitus ([facility])     Medications:  Prescriptions Prior to Admission  Medication Sig Dispense Refill Last Dose  . calcium acetate (PHOSLO) 667 MG capsule Take 1,334 mg by mouth 3 (three) times daily with meals.   09/23/2016 at Unknown time  . calcium carbonate (OS-CAL - DOSED IN MG OF ELEMENTAL CALCIUM) 1250 (500 Ca) MG tablet Take 1 tablet by mouth once a week.    Past Month at Unknown time  . carvedilol (COREG) 25 MG tablet Take 25 mg by mouth 2 (two) times daily.   09/22/2016  . isosorbide mononitrate (IMDUR) 30 MG 24 hr tablet Take 1 tablet (30 mg total) by mouth daily. 30 tablet 0 09/22/2016  . repaglinide (PRANDIN) 0.5 MG tablet Take 1 tablet (0.5 mg total) by mouth 2 (two) times daily before a meal.  (Patient taking differently: Take 0.5 mg by mouth 2 (two) times daily. ) 60 tablet 11 09/22/2016  . warfarin (COUMADIN) 10 MG tablet Take 10 mg by mouth as directed. Take 10 mgs daily on Thursdays   Past Week at Unknown time  . acetaminophen (TYLENOL) 500 MG tablet Take 1,000 mg by mouth every 6 (six) hours as needed for moderate pain or headache.   More than a month at Unknown time  . albuterol (PROVENTIL HFA;VENTOLIN HFA) 108 (90 Base) MCG/ACT inhaler Inhale 1-2 puffs into the lungs every 6 (six) hours as needed for wheezing or shortness of breath.   More than a month at Unknown time  . amLODipine (NORVASC) 2.5 MG tablet Take 2.5 mg by mouth daily.   09/22/2016  . colchicine 0.6 MG tablet Take 1 tablet (0.6 mg total) by mouth every 14 (fourteen) days.   More than a month at Unknown time  . oxyCODONE-acetaminophen (ROXICET) 5-325 MG tablet Take 1 tablet by mouth every 6 (six) hours as needed for moderate pain. 10 tablet 0 More than a month at Unknown time  . predniSONE (DELTASONE) 50 MG tablet One tablet (50mg ) 13 hours prior to procedure; one tablet (50mg ) 7 hours prior to procedure and then one tablet (50  mg) one hour prior to procedure. 3 tablet 0 More than a month at Unknown time  . warfarin (COUMADIN) 7.5 MG tablet Take 1 tablet (7.5 mg total) by mouth daily at 6 PM. (Patient taking differently: Take 7.5 mg by mouth as directed. Take 7.5 mgs daily on Monday, Tuesday, Wednesday, Friday, Saturday, and Sunday) 7 tablet 0 09/22/2016    Assessment: 61 yo M s/p ablation for aflutter.  Pharmacy consulted to resume coumadin.   INR 1.89, Hg 12.4, pltc 147 Home dose: 7.5 mg daily x 10 mg on Thursdays, last dose Monday 2/26  Goal of Therapy:  INR 2-3 Monitor platelets by anticoagulation protocol: Yes   Plan:  Coumadin 7.5 mg daily INR daily  Eudelia Bunch, Pharm.D. [phone] 09/24/2016 2:25 PM

## 2016-09-24 NOTE — Progress Notes (Addendum)
Notified that Patrick [name] will be staying overnight s/p cardiac ablation procedure today. He is an ESRD patient, last dialyzed 2/27. Labs and vital signs reviewed. Dialysis orders entered for tomorrow morning (09/25/16). Full note to follow at that time.   Veneta Penton, PA-C 09/24/2016  Nephrology [facility] pager: [phone]  Pt seen, examined and agree w A/P as above.  Kelly Splinter MD [employer] pager [phone]   09/24/2016, 6:21 PM

## 2016-09-24 NOTE — Anesthesia Preprocedure Evaluation (Addendum)
Anesthesia Evaluation  Patient identified by MRN, date of birth, ID band Patient awake    Reviewed: Allergy & Precautions, NPO status , Patient's Chart, lab work & pertinent test results  Airway Mallampati: II  TM Distance: >3 FB Neck ROM: Full    Dental  (+) Teeth Intact, Dental Advisory Given, Chipped,    Pulmonary former smoker,    Pulmonary exam normal        Cardiovascular hypertension, + CAD and + Past MI  Normal cardiovascular exam+ dysrhythmias Atrial Fibrillation      Neuro/Psych    GI/Hepatic   Endo/Other  diabetes, Type 2, Oral Hypoglycemic Agents  Renal/GU Dialysis and CRFRenal disease     Musculoskeletal   Abdominal   Peds  Hematology   Anesthesia Other Findings   Reproductive/Obstetrics                            Anesthesia Physical Anesthesia Plan  ASA: III  Anesthesia Plan: General   Post-op Pain Management:    Induction: Intravenous  Airway Management Planned: Oral ETT  Additional Equipment:   Intra-op Plan:   Post-operative Plan: Extubation in OR  Informed Consent: I have reviewed the patients History and Physical, chart, labs and discussed the procedure including the risks, benefits and alternatives for the proposed anesthesia with the patient or authorized representative who has indicated his/her understanding and acceptance.     Plan Discussed with: CRNA and Surgeon  Anesthesia Plan Comments:         Anesthesia Quick Evaluation

## 2016-09-24 NOTE — Discharge Summary (Signed)
ELECTROPHYSIOLOGY PROCEDURE DISCHARGE SUMMARY    Patient ID: Patrick [name],  MRN: [id number], DOB/AGE: 1956-05-24 61 y.o.  Admit date: 09/24/2016 Discharge date: 09/25/2016  Primary Care Physician: Patrick Fendt, MD Primary Cardiologist: Patrick [name] Electrophysiologist: Patrick [name]  Primary Discharge Diagnosis:  Atrial flutter status post ablation this admission  Secondary Discharge Diagnosis:  1.  ESRD on HD 2.  Diabetes 3.  Hypertension 4.  Secondary hyperparathyroidism  Allergies  Allergen Reactions  . Lipitor [Atorvastatin]     Leg pain  . Metoprolol     Headaches   . Iodinated Diagnostic Agents Rash    Other Reaction: burning to mouth, swelling of lips.     Procedures This Admission: 1.  Electrophysiology study and radiofrequency catheter ablation on 09/24/16 by Dr Patrick [name].  This study demonstrated typical atrial flutter with successful CTI ablation.  There were no inducible arrhythmias following ablation and no early apparent complications.   Brief HPI: Patrick [name] is a 61 y.o. male with a past medical history as outlined above.  He has documented atrial flutter. Risks, benefits, and alternatives to ablation were reviewed with the patient who wished to proceed.   Hospital Course:  The patient was admitted and underwent EPS/RFCA with details as outlined above. He was monitored on telemetry overnight which demonstrated sinus rhythm.  Groin incisions was without complication. HD was arranged by nephrology and done prior to discharge. They were examined by Dr Patrick [name] who considered them stable for discharge to home.  Follow up Patrick [name] be arranged in 4 weeks.  Wound care and restrictions were reviewed with the patient prior to discharge.   Physical Exam: Vitals:   09/25/16 0737 09/25/16 0800 09/25/16 0830 09/25/16 0900  BP: 140/77 138/86 (!) 140/92 (!) 90/30  Pulse: 79 61 (!) 46 66  Resp: 18   19  Temp:      TempSrc:      SpO2:      Weight:      Height:        GEN- The  patient is well appearing, alert and oriented x 3 today.   HEENT: normocephalic, atraumatic; sclera clear, conjunctiva pink; hearing intact; oropharynx clear; neck supple  Lungs- Clear to ausculation bilaterally, normal work of breathing.  No wheezes, rales, rhonchi Heart- Regular rate and rhythm  GI- soft, non-tender, non-distended, bowel sounds present  Extremities- no clubbing, cyanosis, or edema; DP/PT/radial pulses 2+ bilaterally, groin without hematoma/bruit MS- no significant deformity or atrophy Skin- warm and dry, no rash or lesion Psych- euthymic mood, full affect Neuro- strength and sensation are intact   Labs:   Lab Results  Component Value Date   WBC 5.8 09/25/2016   HGB 10.0 (L) 09/25/2016   HCT 32.4 (L) 09/25/2016   MCV 99.7 09/25/2016   PLT 136 (L) 09/25/2016     Recent Labs Lab 09/25/16 0815  NA 135  K 4.7  CL 98*  CO2 22  BUN 52*  CREATININE 13.57*  CALCIUM 8.2*  GLUCOSE 109*    Discharge Medications:  Allergies as of 09/25/2016      Reactions   Lipitor [atorvastatin]    Leg pain   Metoprolol    Headaches    Iodinated Diagnostic Agents Rash   Other Reaction: burning to mouth, swelling of lips.      Medication List    TAKE these medications   acetaminophen 500 MG tablet Commonly known as:  TYLENOL Take 1,000 mg by mouth every 6 (six) hours as needed for moderate pain  or headache.   albuterol 108 (90 Base) MCG/ACT inhaler Commonly known as:  PROVENTIL HFA;VENTOLIN HFA Inhale 1-2 puffs into the lungs every 6 (six) hours as needed for wheezing or shortness of breath.   amLODipine 2.5 MG tablet Commonly known as:  NORVASC Take 2.5 mg by mouth daily.   calcium acetate 667 MG capsule Commonly known as:  PHOSLO Take 1,334 mg by mouth 3 (three) times daily with meals.   calcium carbonate 1250 (500 Ca) MG tablet Commonly known as:  OS-CAL - dosed in mg of elemental calcium Take 1 tablet by mouth once a week.   carvedilol 25 MG  tablet Commonly known as:  COREG Take 25 mg by mouth 2 (two) times daily.   colchicine 0.6 MG tablet Take 1 tablet (0.6 mg total) by mouth every 14 (fourteen) days.   isosorbide mononitrate 30 MG 24 hr tablet Commonly known as:  IMDUR Take 1 tablet (30 mg total) by mouth daily.   oxyCODONE-acetaminophen 5-325 MG tablet Commonly known as:  ROXICET Take 1 tablet by mouth every 6 (six) hours as needed for moderate pain.   predniSONE 50 MG tablet Commonly known as:  DELTASONE One tablet (50mg ) 13 hours prior to procedure; one tablet (50mg ) 7 hours prior to procedure and then one tablet (50 mg) one hour prior to procedure.   repaglinide 0.5 MG tablet Commonly known as:  PRANDIN Take 1 tablet (0.5 mg total) by mouth 2 (two) times daily before a meal. What changed:  when to take this   warfarin 10 MG tablet Commonly known as:  COUMADIN Take 10 mg by mouth as directed. Take 10 mgs daily on Thursdays What changed:  Another medication with the same name was changed. Make sure you understand how and when to take each.   warfarin 7.5 MG tablet Commonly known as:  COUMADIN Take 1 tablet (7.5 mg total) by mouth daily at 6 PM. What changed:  when to take this  additional instructions       Disposition:  Discharge Instructions    Diet - low sodium heart healthy    Complete by:  As directed    Increase activity slowly    Complete by:  As directed      Follow-up Information    Patrick [name] Patrick Leeds, MD Follow up on 10/22/2016.   Specialty:  Cardiology Why:  at 10:30AM  Contact information: [place] [phone]           Duration of Discharge Encounter: Greater than 30 minutes including physician time.  Signed, Patrick Marshall, NP 09/25/2016 9:19 AM  I have seen and examined this patient with Patrick [name].  Agree with above, note added to reflect my findings.  On exam, regular rhythm, no murmurs, lungs clear. Had atrial flutter  ablation yesterday. Telemetry stable without arrhythmia seen. Plan for discharge today after dialysis session.    Patrick [name] M. Patrick Spark MD 09/25/2016 7:31 PM

## 2016-09-24 NOTE — H&P (Signed)
Patrick [name] is a 61 y.o. male with a history of atrial flutter. He presents today in sinus rhythm for planned ablation. On exam, regular rhythm, no murmurs, lungs clear. Risk and benefits of ablation were discussed. Risks include but not limited to bleeding, tamponade, heart block, and stroke. He understands these risks and has agreed to the procedure.  Curtis Uriarte Curt Bears, MD 09/24/2016 12:07 PM

## 2016-09-24 NOTE — Progress Notes (Signed)
Site area: Right groin a 6, 7, 9, french venous sheath was removed  Site Prior to Removal:  Level 0  Pressure Applied For 20 MINUTES    Bedrest Beginning at 1445p  Manual:   Yes.    Patient Status During Pull:  stable  Post Pull Groin Site:  Level 0  Post Pull Instructions Given:  Yes.    Post Pull Pulses Present:  Yes.    Dressing Applied:  Yes.    Comments:  VS remain stable during sheath pull

## 2016-09-24 NOTE — Progress Notes (Signed)
Spoke with Dr Jonnie Finner with nephrology. They will arrange HD tomorrow prior to discharge.  Chanetta Marshall, NP 09/24/2016 1:43 PM

## 2016-09-24 NOTE — Anesthesia Procedure Notes (Signed)
Procedure Name: Intubation Date/Time: 09/24/2016 12:14 PM Performed by: Sampson Si E Pre-anesthesia Checklist: Patient identified, Emergency Drugs available, Suction available and Patient being monitored Patient Re-evaluated:Patient Re-evaluated prior to inductionOxygen Delivery Method: Circle System Utilized Preoxygenation: Pre-oxygenation with 100% oxygen Intubation Type: IV induction and Rapid sequence Ventilation: Mask ventilation without difficulty Laryngoscope Size: Mac and 3 Grade View: Grade II Tube type: Oral Tube size: 7.5 mm Number of attempts: 1 Airway Equipment and Method: Stylet and Oral airway Placement Confirmation: ETT inserted through vocal cords under direct vision,  positive ETCO2 and breath sounds checked- equal and bilateral Secured at: 23 cm Tube secured with: Tape Dental Injury: Teeth and Oropharynx as per pre-operative assessment

## 2016-09-25 DIAGNOSIS — I483 Typical atrial flutter: Secondary | ICD-10-CM

## 2016-09-25 DIAGNOSIS — E1122 Type 2 diabetes mellitus with diabetic chronic kidney disease: Secondary | ICD-10-CM | POA: Diagnosis not present

## 2016-09-25 DIAGNOSIS — I471 Supraventricular tachycardia: Secondary | ICD-10-CM | POA: Diagnosis not present

## 2016-09-25 DIAGNOSIS — I252 Old myocardial infarction: Secondary | ICD-10-CM | POA: Diagnosis not present

## 2016-09-25 DIAGNOSIS — I4891 Unspecified atrial fibrillation: Secondary | ICD-10-CM | POA: Diagnosis not present

## 2016-09-25 DIAGNOSIS — Z992 Dependence on renal dialysis: Secondary | ICD-10-CM | POA: Diagnosis not present

## 2016-09-25 DIAGNOSIS — I120 Hypertensive chronic kidney disease with stage 5 chronic kidney disease or end stage renal disease: Secondary | ICD-10-CM | POA: Diagnosis not present

## 2016-09-25 DIAGNOSIS — Z87891 Personal history of nicotine dependence: Secondary | ICD-10-CM | POA: Diagnosis not present

## 2016-09-25 DIAGNOSIS — I4892 Unspecified atrial flutter: Secondary | ICD-10-CM | POA: Diagnosis not present

## 2016-09-25 DIAGNOSIS — I2510 Atherosclerotic heart disease of native coronary artery without angina pectoris: Secondary | ICD-10-CM | POA: Diagnosis not present

## 2016-09-25 DIAGNOSIS — N2581 Secondary hyperparathyroidism of renal origin: Secondary | ICD-10-CM | POA: Diagnosis not present

## 2016-09-25 DIAGNOSIS — N186 End stage renal disease: Secondary | ICD-10-CM | POA: Diagnosis not present

## 2016-09-25 DIAGNOSIS — M1990 Unspecified osteoarthritis, unspecified site: Secondary | ICD-10-CM | POA: Diagnosis not present

## 2016-09-25 LAB — CBC
HCT: 32.4 % — ABNORMAL LOW (ref 39.0–52.0)
Hemoglobin: 10 g/dL — ABNORMAL LOW (ref 13.0–17.0)
MCH: 30.8 pg (ref 26.0–34.0)
MCHC: 30.9 g/dL (ref 30.0–36.0)
MCV: 99.7 fL (ref 78.0–100.0)
PLATELETS: 136 10*3/uL — AB (ref 150–400)
RBC: 3.25 MIL/uL — AB (ref 4.22–5.81)
RDW: 17 % — ABNORMAL HIGH (ref 11.5–15.5)
WBC: 5.8 10*3/uL (ref 4.0–10.5)

## 2016-09-25 LAB — RENAL FUNCTION PANEL
Albumin: 3.2 g/dL — ABNORMAL LOW (ref 3.5–5.0)
Anion gap: 15 (ref 5–15)
BUN: 52 mg/dL — ABNORMAL HIGH (ref 6–20)
CALCIUM: 8.2 mg/dL — AB (ref 8.9–10.3)
CO2: 22 mmol/L (ref 22–32)
CREATININE: 13.57 mg/dL — AB (ref 0.61–1.24)
Chloride: 98 mmol/L — ABNORMAL LOW (ref 101–111)
GFR, EST AFRICAN AMERICAN: 4 mL/min — AB (ref >60)
GFR, EST NON AFRICAN AMERICAN: 3 mL/min — AB (ref >60)
Glucose, Bld: 109 mg/dL — ABNORMAL HIGH (ref 65–99)
Phosphorus: 7.1 mg/dL — ABNORMAL HIGH (ref 2.5–4.6)
Potassium: 4.7 mmol/L (ref 3.5–5.1)
SODIUM: 135 mmol/L (ref 135–145)

## 2016-09-25 NOTE — Procedures (Signed)
Patient was seen on dialysis and the procedure was supervised.  BFR 350  Via PC BP is  80/40- but asymptomatic .   Patient appears to be tolerating treatment well  Patrick [name] A 09/25/2016

## 2016-09-25 NOTE — Progress Notes (Signed)
Lab advised that patient refused am lab draw.Marland Kitchen

## 2016-09-25 NOTE — Progress Notes (Signed)
Received from dialysis,  B/P  83/34  HR 79  Resp 18 O2 sat 94%    Room air      Patient refused telemetry to be placed back on says he is going home   CCMD  Notified     IV  NSL  Discontinued  Mervyn Skeeters, RN

## 2016-09-27 DIAGNOSIS — D631 Anemia in chronic kidney disease: Secondary | ICD-10-CM | POA: Diagnosis not present

## 2016-09-27 DIAGNOSIS — T8249XD Other complication of vascular dialysis catheter, subsequent encounter: Secondary | ICD-10-CM | POA: Diagnosis not present

## 2016-09-27 DIAGNOSIS — N186 End stage renal disease: Secondary | ICD-10-CM | POA: Diagnosis not present

## 2016-09-27 DIAGNOSIS — R7881 Bacteremia: Secondary | ICD-10-CM | POA: Diagnosis not present

## 2016-09-27 DIAGNOSIS — E1029 Type 1 diabetes mellitus with other diabetic kidney complication: Secondary | ICD-10-CM | POA: Diagnosis not present

## 2016-09-27 DIAGNOSIS — N2581 Secondary hyperparathyroidism of renal origin: Secondary | ICD-10-CM | POA: Diagnosis not present

## 2016-09-30 DIAGNOSIS — E1029 Type 1 diabetes mellitus with other diabetic kidney complication: Secondary | ICD-10-CM | POA: Diagnosis not present

## 2016-09-30 DIAGNOSIS — N2581 Secondary hyperparathyroidism of renal origin: Secondary | ICD-10-CM | POA: Diagnosis not present

## 2016-09-30 DIAGNOSIS — D631 Anemia in chronic kidney disease: Secondary | ICD-10-CM | POA: Diagnosis not present

## 2016-09-30 DIAGNOSIS — T8249XD Other complication of vascular dialysis catheter, subsequent encounter: Secondary | ICD-10-CM | POA: Diagnosis not present

## 2016-09-30 DIAGNOSIS — R7881 Bacteremia: Secondary | ICD-10-CM | POA: Diagnosis not present

## 2016-09-30 DIAGNOSIS — N186 End stage renal disease: Secondary | ICD-10-CM | POA: Diagnosis not present

## 2016-10-02 DIAGNOSIS — D631 Anemia in chronic kidney disease: Secondary | ICD-10-CM | POA: Diagnosis not present

## 2016-10-02 DIAGNOSIS — N186 End stage renal disease: Secondary | ICD-10-CM | POA: Diagnosis not present

## 2016-10-02 DIAGNOSIS — E1029 Type 1 diabetes mellitus with other diabetic kidney complication: Secondary | ICD-10-CM | POA: Diagnosis not present

## 2016-10-02 DIAGNOSIS — N2581 Secondary hyperparathyroidism of renal origin: Secondary | ICD-10-CM | POA: Diagnosis not present

## 2016-10-02 DIAGNOSIS — T8249XD Other complication of vascular dialysis catheter, subsequent encounter: Secondary | ICD-10-CM | POA: Diagnosis not present

## 2016-10-02 DIAGNOSIS — R7881 Bacteremia: Secondary | ICD-10-CM | POA: Diagnosis not present

## 2016-10-03 ENCOUNTER — Ambulatory Visit (INDEPENDENT_AMBULATORY_CARE_PROVIDER_SITE_OTHER): Payer: Medicare Other | Admitting: Endocrinology

## 2016-10-03 ENCOUNTER — Encounter: Payer: Self-pay | Admitting: Endocrinology

## 2016-10-03 VITALS — BP 122/72 | HR 84 | Ht 66.5 in | Wt 217.0 lb

## 2016-10-03 DIAGNOSIS — N184 Chronic kidney disease, stage 4 (severe): Secondary | ICD-10-CM | POA: Diagnosis not present

## 2016-10-03 DIAGNOSIS — E1351 Other specified diabetes mellitus with diabetic peripheral angiopathy without gangrene: Secondary | ICD-10-CM | POA: Diagnosis not present

## 2016-10-03 DIAGNOSIS — L02611 Cutaneous abscess of right foot: Secondary | ICD-10-CM | POA: Diagnosis not present

## 2016-10-03 DIAGNOSIS — E1122 Type 2 diabetes mellitus with diabetic chronic kidney disease: Secondary | ICD-10-CM | POA: Diagnosis not present

## 2016-10-03 DIAGNOSIS — L03031 Cellulitis of right toe: Secondary | ICD-10-CM | POA: Diagnosis not present

## 2016-10-03 DIAGNOSIS — L602 Onychogryphosis: Secondary | ICD-10-CM | POA: Diagnosis not present

## 2016-10-03 DIAGNOSIS — M79674 Pain in right toe(s): Secondary | ICD-10-CM | POA: Diagnosis not present

## 2016-10-03 LAB — POCT GLYCOSYLATED HEMOGLOBIN (HGB A1C): HEMOGLOBIN A1C: 5.8

## 2016-10-03 NOTE — Patient Instructions (Addendum)
Please stop taking the repaglinide.   check your blood sugar once a day.  vary the time of day when you check, between before the 3 meals, and at bedtime.  also check if you have symptoms of your blood sugar being too high or too low.  please keep a record of the readings and bring it to your next appointment here.  You can write it on any piece of paper.  please call us sooner if your blood sugar goes below 70, or if you have a lot of readings over 200. Please come back for a follow-up appointment in 4 months.

## 2016-10-03 NOTE — Progress Notes (Signed)
Subjective:    Patient ID: Patrick [name], male    DOB: [birth year], 61 y.o.   MRN: [id number]  HPI Pt returns for f/u of diabetes mellitus: DM type: 2 Dx'ed: 2014, when he presented with severe hyperglycemia.   Complications: polyneuropathy and renal failure.  Therapy: repaglinide.  DKA: never. Severe hypoglycemia: never. Pancreatitis: never. Other: he took insulin 2014-2016; he had renal transplant in 2009, but renal failure has recurred.  He is back on dialysis.  Fructosamine has suggested a1c approx 1% higher than a1c itself.  Interval history: pt states he feels well in general. no cbg record, but states cbg's are well-controlled.  Past Medical History:  Diagnosis Date  . Arthritis    "back" (09/24/2016)  . ESRD (end stage renal disease) on dialysis [facility])    "TTS; [place]" (09/24/2016)  . Gout   . History of blood transfusion 2009- 2016   "I've had several; low HgB"  . History of kidney stones    treated with nephrectomy  . Hypertension   . Impotence of organic origin   . Migraine    "stopped in my 30's; they were related to high BP" (09/24/2016)  . Myocardial infarction '96  . Pneumonia 01/2015   " a touch & I was in the hosp."  . Secondary hyperparathyroidism ([place])   . Type 2 diabetes mellitus ([place])     Past Surgical History:  Procedure Laterality Date  . A-FLUTTER ABLATION N/A 09/24/2016   Procedure: A-Flutter Ablation;  Surgeon: Will Meredith Leeds, MD;  Location: [place] [facility];  Service: Cardiovascular;  Laterality: N/A;  . APPENDECTOMY    . AV FISTULA PLACEMENT Right 09/18/2014   Procedure: INSERTION OF ARTERIOVENOUS (AV) GORE-TEX GRAFT ARM USING 4-7MM  X 45CM STRETCH GORE-TEX VASCULAR GRAFT;  Surgeon: Rosetta Posner, MD;  Location: [place];  Service: Vascular;  Laterality: Right;  . AV FISTULA PLACEMENT Left 07/07/2016   Procedure: INSERTION OF LEFT BRACHIAL TO AXILLARY ARTERIOVENOUS (AV) GORE-TEX ARM GRAFT;  Surgeon: Conrad Ramirez-Perez, MD;  Location: [place];   Service: Vascular;  Laterality: Left;  . CARDIAC CATHETERIZATION  ~ 2016  . 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 HERNIA REPAIR  2017   with nephrectomy  . IR GENERIC HISTORICAL  05/11/2016   IR FLUORO GUIDE CV LINE LEFT 05/11/2016 Corrie Mckusick, DO [facility]  . IR GENERIC HISTORICAL  05/11/2016   IR US GUIDE VASC ACCESS LEFT 05/11/2016 Corrie Mckusick, DO [facility]  . IR GENERIC HISTORICAL  05/11/2016   IR US GUIDE VASC ACCESS RIGHT 05/11/2016 Corrie Mckusick, DO [facility]  . IR GENERIC HISTORICAL  05/11/2016   IR RADIOLOGY PERIPHERAL GUIDED IV START 05/11/2016 Corrie Mckusick, DO [facility]  . KIDNEY TRANSPLANT  2009  . NEPHRECTOMY  2017   transplant rejected   . PERIPHERAL VASCULAR CATHETERIZATION N/A 06/04/2016   Procedure: Upper Extremity Venography;  Surgeon: Waynetta Sandy, MD;  Location: [place] [facility];  Service: Cardiovascular;  Laterality: N/A;    Social History   Social History  . Marital status: Married    Spouse name: N/A  . Number of children: N/A  . Years of education: N/A   Occupational History  . Not on file.   Social History Main Topics  . Smoking status: Former Smoker    Types: Cigarettes    Quit date: 07/04/2014  . Smokeless tobacco: Never Used  Comment: "smoked ~ 1 pack/month when I did smoke; never a steady smoker"  . Alcohol use 0.0 oz/week     Comment: 09/24/2016 "2 drinks, 1-2 times/year"  . Drug use: No  . Sexual activity: Not Currently    Birth control/ protection: None   Other Topics Concern  . Not on file   Social History Narrative  . No narrative on file    Current Outpatient Prescriptions on File Prior to Visit  Medication Sig Dispense Refill  . acetaminophen (TYLENOL) 500 MG tablet Take 1,000 mg by mouth every 6 (six) hours as needed for moderate pain or headache.    . albuterol (PROVENTIL HFA;VENTOLIN HFA) 108 (90 Base) MCG/ACT  inhaler Inhale 1-2 puffs into the lungs every 6 (six) hours as needed for wheezing or shortness of breath.    . calcium acetate (PHOSLO) 667 MG capsule Take 1,334 mg by mouth 3 (three) times daily with meals.    . calcium carbonate (OS-CAL - DOSED IN MG OF ELEMENTAL CALCIUM) 1250 (500 Ca) MG tablet Take 1 tablet by mouth once a week.     . colchicine 0.6 MG tablet Take 1 tablet (0.6 mg total) by mouth every 14 (fourteen) days.    . isosorbide mononitrate (IMDUR) 30 MG 24 hr tablet Take 1 tablet (30 mg total) by mouth daily. 30 tablet 0  . warfarin (COUMADIN) 10 MG tablet Take 10 mg by mouth as directed. Take 10 mgs daily on Thursdays    . warfarin (COUMADIN) 7.5 MG tablet Take 1 tablet (7.5 mg total) by mouth daily at 6 PM. (Patient taking differently: Take 7.5 mg by mouth as directed. Take 7.5 mgs daily on Monday, Tuesday, Wednesday, Friday, Saturday, and Sunday) 7 tablet 0   No current facility-administered medications on file prior to visit.     Allergies  Allergen Reactions  . Lipitor [Atorvastatin]     Leg pain  . Metoprolol     Headaches   . Iodinated Diagnostic Agents Rash    Other Reaction: burning to mouth, swelling of lips.    Family History  Problem Relation Age of Onset  . Hyperlipidemia Mother   . Hypertension Mother    BP 122/72   Pulse 84   Ht 5' 6.5" (1.689 m)   Wt 217 lb (98.4 kg)   SpO2 98%   BMI 34.50 kg/m   Review of Systems He denies hypoglycemia.     Objective:   Physical Exam VITAL SIGNS:  See vs page GENERAL: no distress Pulses: dorsalis pedis intact bilat.   MSK: no deformity of the feet CV: no leg edema Skin:  no ulcer on the feet.  normal color and temp on the feet. Neuro: sensation is intact to touch on the feet, but decreased from normal Ext: right great toe is bandaged (podiatry procedure this am)   A1c=5.8%    Assessment & Plan:  Type 2 DM, with polyneuropathy: Overcontrolled.  Patient is advised the following: Patient Instructions   Please stop taking the repaglinide.   check your blood sugar once a day.  vary the time of day when you check, between before the 3 meals, and at bedtime.  also check if you have symptoms of your blood sugar being too high or too low.  please keep a record of the readings and bring it to your next appointment here.  You can write it on any piece of paper.  please call us sooner if your blood sugar goes below 70, or if  you have a lot of readings over 200. Please come back for a follow-up appointment in 4 months.

## 2016-10-07 DIAGNOSIS — E1029 Type 1 diabetes mellitus with other diabetic kidney complication: Secondary | ICD-10-CM | POA: Diagnosis not present

## 2016-10-07 DIAGNOSIS — D631 Anemia in chronic kidney disease: Secondary | ICD-10-CM | POA: Diagnosis not present

## 2016-10-07 DIAGNOSIS — N186 End stage renal disease: Secondary | ICD-10-CM | POA: Diagnosis not present

## 2016-10-07 DIAGNOSIS — R7881 Bacteremia: Secondary | ICD-10-CM | POA: Diagnosis not present

## 2016-10-07 DIAGNOSIS — N2581 Secondary hyperparathyroidism of renal origin: Secondary | ICD-10-CM | POA: Diagnosis not present

## 2016-10-07 DIAGNOSIS — T8249XD Other complication of vascular dialysis catheter, subsequent encounter: Secondary | ICD-10-CM | POA: Diagnosis not present

## 2016-10-08 DIAGNOSIS — Z452 Encounter for adjustment and management of vascular access device: Secondary | ICD-10-CM | POA: Diagnosis not present

## 2016-10-09 DIAGNOSIS — N2581 Secondary hyperparathyroidism of renal origin: Secondary | ICD-10-CM | POA: Diagnosis not present

## 2016-10-09 DIAGNOSIS — E1029 Type 1 diabetes mellitus with other diabetic kidney complication: Secondary | ICD-10-CM | POA: Diagnosis not present

## 2016-10-09 DIAGNOSIS — R7881 Bacteremia: Secondary | ICD-10-CM | POA: Diagnosis not present

## 2016-10-09 DIAGNOSIS — N186 End stage renal disease: Secondary | ICD-10-CM | POA: Diagnosis not present

## 2016-10-09 DIAGNOSIS — D631 Anemia in chronic kidney disease: Secondary | ICD-10-CM | POA: Diagnosis not present

## 2016-10-09 DIAGNOSIS — T8249XD Other complication of vascular dialysis catheter, subsequent encounter: Secondary | ICD-10-CM | POA: Diagnosis not present

## 2016-10-11 DIAGNOSIS — E1029 Type 1 diabetes mellitus with other diabetic kidney complication: Secondary | ICD-10-CM | POA: Diagnosis not present

## 2016-10-11 DIAGNOSIS — N186 End stage renal disease: Secondary | ICD-10-CM | POA: Diagnosis not present

## 2016-10-11 DIAGNOSIS — T8249XD Other complication of vascular dialysis catheter, subsequent encounter: Secondary | ICD-10-CM | POA: Diagnosis not present

## 2016-10-11 DIAGNOSIS — R7881 Bacteremia: Secondary | ICD-10-CM | POA: Diagnosis not present

## 2016-10-11 DIAGNOSIS — D631 Anemia in chronic kidney disease: Secondary | ICD-10-CM | POA: Diagnosis not present

## 2016-10-11 DIAGNOSIS — N2581 Secondary hyperparathyroidism of renal origin: Secondary | ICD-10-CM | POA: Diagnosis not present

## 2016-10-14 DIAGNOSIS — D631 Anemia in chronic kidney disease: Secondary | ICD-10-CM | POA: Diagnosis not present

## 2016-10-14 DIAGNOSIS — E1029 Type 1 diabetes mellitus with other diabetic kidney complication: Secondary | ICD-10-CM | POA: Diagnosis not present

## 2016-10-14 DIAGNOSIS — R7881 Bacteremia: Secondary | ICD-10-CM | POA: Diagnosis not present

## 2016-10-14 DIAGNOSIS — N186 End stage renal disease: Secondary | ICD-10-CM | POA: Diagnosis not present

## 2016-10-14 DIAGNOSIS — N2581 Secondary hyperparathyroidism of renal origin: Secondary | ICD-10-CM | POA: Diagnosis not present

## 2016-10-14 DIAGNOSIS — T8249XD Other complication of vascular dialysis catheter, subsequent encounter: Secondary | ICD-10-CM | POA: Diagnosis not present

## 2016-10-16 DIAGNOSIS — T8249XD Other complication of vascular dialysis catheter, subsequent encounter: Secondary | ICD-10-CM | POA: Diagnosis not present

## 2016-10-16 DIAGNOSIS — D631 Anemia in chronic kidney disease: Secondary | ICD-10-CM | POA: Diagnosis not present

## 2016-10-16 DIAGNOSIS — N186 End stage renal disease: Secondary | ICD-10-CM | POA: Diagnosis not present

## 2016-10-16 DIAGNOSIS — R7881 Bacteremia: Secondary | ICD-10-CM | POA: Diagnosis not present

## 2016-10-16 DIAGNOSIS — N2581 Secondary hyperparathyroidism of renal origin: Secondary | ICD-10-CM | POA: Diagnosis not present

## 2016-10-16 DIAGNOSIS — E1029 Type 1 diabetes mellitus with other diabetic kidney complication: Secondary | ICD-10-CM | POA: Diagnosis not present

## 2016-10-18 DIAGNOSIS — R7881 Bacteremia: Secondary | ICD-10-CM | POA: Diagnosis not present

## 2016-10-18 DIAGNOSIS — E1029 Type 1 diabetes mellitus with other diabetic kidney complication: Secondary | ICD-10-CM | POA: Diagnosis not present

## 2016-10-18 DIAGNOSIS — T8249XD Other complication of vascular dialysis catheter, subsequent encounter: Secondary | ICD-10-CM | POA: Diagnosis not present

## 2016-10-18 DIAGNOSIS — N2581 Secondary hyperparathyroidism of renal origin: Secondary | ICD-10-CM | POA: Diagnosis not present

## 2016-10-18 DIAGNOSIS — N186 End stage renal disease: Secondary | ICD-10-CM | POA: Diagnosis not present

## 2016-10-18 DIAGNOSIS — D631 Anemia in chronic kidney disease: Secondary | ICD-10-CM | POA: Diagnosis not present

## 2016-10-21 DIAGNOSIS — N186 End stage renal disease: Secondary | ICD-10-CM | POA: Diagnosis not present

## 2016-10-21 DIAGNOSIS — T8249XD Other complication of vascular dialysis catheter, subsequent encounter: Secondary | ICD-10-CM | POA: Diagnosis not present

## 2016-10-21 DIAGNOSIS — D631 Anemia in chronic kidney disease: Secondary | ICD-10-CM | POA: Diagnosis not present

## 2016-10-21 DIAGNOSIS — R7881 Bacteremia: Secondary | ICD-10-CM | POA: Diagnosis not present

## 2016-10-21 DIAGNOSIS — N2581 Secondary hyperparathyroidism of renal origin: Secondary | ICD-10-CM | POA: Diagnosis not present

## 2016-10-21 DIAGNOSIS — E1029 Type 1 diabetes mellitus with other diabetic kidney complication: Secondary | ICD-10-CM | POA: Diagnosis not present

## 2016-10-21 NOTE — Progress Notes (Signed)
Electrophysiology Office Note   Date:  10/22/2016   ID:  Patrick [name], DOB Jun 21, 1956, MRN [id number]  PCP:  Philis Fendt, MD  Cardiologist:  Einar Gip Primary Electrophysiologist:  Constance Haw, MD    Chief Complaint  Patient presents with  . Follow-up    Typical aflutter     History of Present Illness: Patrick [name] is a 61 y.o. male who presents today for electrophysiology evaluation.   He presented to the hospital in atrial flutter in October. Had ablation 09/24/16. Since that time he has done well without any recurrent palpitations.  Today, he denies symptoms of palpitations, chest pain, shortness of breath, orthopnea, PND, lower extremity edema, claudication, dizziness, presyncope, syncope, bleeding, or neurologic sequela. The patient is tolerating medications without difficulties and is otherwise without complaint today.    Past Medical History:  Diagnosis Date  . Arthritis    "back" (09/24/2016)  . ESRD (end stage renal disease) on dialysis [facility])    "TTS; [place]" (09/24/2016)  . Gout   . History of blood transfusion 2009- 2016   "I've had several; low HgB"  . History of kidney stones    treated with nephrectomy  . Hypertension   . Impotence of organic origin   . Migraine    "stopped in my 30's; they were related to high BP" (09/24/2016)  . Myocardial infarction '96  . Pneumonia 01/2015   " a touch & I was in the hosp."  . Secondary hyperparathyroidism ([place])   . Type 2 diabetes mellitus ([place])    Past Surgical History:  Procedure Laterality Date  . A-FLUTTER ABLATION N/A 09/24/2016   Procedure: A-Flutter Ablation;  Surgeon: Adiya Selmer Meredith Leeds, MD;  Location: [place] [facility];  Service: Cardiovascular;  Laterality: N/A;  . APPENDECTOMY    . AV FISTULA PLACEMENT Right 09/18/2014   Procedure: INSERTION OF ARTERIOVENOUS (AV) GORE-TEX GRAFT ARM USING 4-7MM  X 45CM STRETCH GORE-TEX VASCULAR GRAFT;  Surgeon: Rosetta Posner, MD;  Location: [place];  Service:  Vascular;  Laterality: Right;  . AV FISTULA PLACEMENT Left 07/07/2016   Procedure: INSERTION OF LEFT BRACHIAL TO AXILLARY ARTERIOVENOUS (AV) GORE-TEX ARM GRAFT;  Surgeon: Conrad Southampton, MD;  Location: [place];  Service: Vascular;  Laterality: Left;  . CARDIAC CATHETERIZATION  ~ 2016  . 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 HERNIA REPAIR  2017   with nephrectomy  . IR GENERIC HISTORICAL  05/11/2016   IR FLUORO GUIDE CV LINE LEFT 05/11/2016 Corrie Mckusick, DO [facility]  . IR GENERIC HISTORICAL  05/11/2016   IR US GUIDE VASC ACCESS LEFT 05/11/2016 Corrie Mckusick, DO [facility]  . IR GENERIC HISTORICAL  05/11/2016   IR US GUIDE VASC ACCESS RIGHT 05/11/2016 Corrie Mckusick, DO [facility]  . IR GENERIC HISTORICAL  05/11/2016   IR RADIOLOGY PERIPHERAL GUIDED IV START 05/11/2016 Corrie Mckusick, DO [facility]  . KIDNEY TRANSPLANT  2009  . NEPHRECTOMY  2017   transplant rejected   . PERIPHERAL VASCULAR CATHETERIZATION N/A 06/04/2016   Procedure: Upper Extremity Venography;  Surgeon: Waynetta Sandy, MD;  Location: [place] [facility];  Service: Cardiovascular;  Laterality: N/A;     Current Outpatient Prescriptions  Medication Sig Dispense Refill  . acetaminophen (TYLENOL) 500 MG tablet Take 1,000 mg by mouth every 6 (six) hours as needed for moderate pain or headache.    . albuterol (PROVENTIL HFA;VENTOLIN HFA) 108 (  90 Base) MCG/ACT inhaler Inhale 1-2 puffs into the lungs every 6 (six) hours as needed for wheezing or shortness of breath.    . calcium acetate (PHOSLO) 667 MG capsule Take 1,334 mg by mouth 3 (three) times daily with meals.    . calcium carbonate (OS-CAL - DOSED IN MG OF ELEMENTAL CALCIUM) 1250 (500 Ca) MG tablet Take 1 tablet by mouth once a week.     . colchicine 0.6 MG tablet Take 1 tablet (0.6 mg total) by mouth every 14 (fourteen) days.    . isosorbide mononitrate (IMDUR) 30 MG 24 hr tablet  Take 1 tablet (30 mg total) by mouth daily. 30 tablet 0  . warfarin (COUMADIN) 10 MG tablet Take 10 mg by mouth as directed. Take 10 mgs daily on Thursdays    . warfarin (COUMADIN) 7.5 MG tablet Take 1 tablet (7.5 mg total) by mouth daily at 6 PM. (Patient taking differently: Take 7.5 mg by mouth as directed. Take 7.5 mgs daily on Monday, Tuesday, Wednesday, Friday, Saturday, and Sunday) 7 tablet 0   No current facility-administered medications for this visit.     Allergies:   Lipitor [atorvastatin]; Metoprolol; and Iodinated diagnostic agents   Social History:  The patient  reports that he quit smoking about 2 years ago. His smoking use included Cigarettes. He has never used smokeless tobacco. He reports that he drinks alcohol. He reports that he does not use drugs.   Family History:  The patient's family history includes Hyperlipidemia in his mother; Hypertension in his mother.    ROS:  Please see the history of present illness.   Otherwise, review of systems is positive for none.   All other systems are reviewed and negative.    PHYSICAL EXAM: VS:  BP 110/62   Pulse 92   Ht 5' 6.5" (1.689 m)   Wt 219 lb (99.3 kg)   BMI 34.82 kg/m  , BMI Body mass index is 34.82 kg/m. GEN: Well nourished, well developed, in no acute distress  HEENT: normal  Neck: no JVD, carotid bruits, or masses Cardiac: RRR; no murmurs, rubs, or gallops,no edema, decreased lower extremity pulses  Respiratory:  clear to auscultation bilaterally, normal work of breathing GI: soft, nontender, nondistended, + BS MS: no deformity or atrophy  Skin: warm and dry Neuro:  Strength and sensation are intact Psych: euthymic mood, full affect  EKG:  EKG is not ordered today. Personal review of the ekg ordered shows sinus rhythm, lateral TWI  Recent Labs: 01/22/2016: B Natriuretic Peptide >4,500.0; Magnesium 1.8 01/23/2016: ALT 8 05/25/2016: TSH 5.113 09/25/2016: BUN 52; Creatinine, Ser 13.57; Hemoglobin 10.0; Platelets  136; Potassium 4.7; Sodium 135    Lipid Panel     Component Value Date/Time   CHOL 192 05/25/2016 1542   TRIG 249 (H) 05/25/2016 1542   HDL 34 (L) 05/25/2016 1542   CHOLHDL 5.6 05/25/2016 1542   VLDL 50 (H) 05/25/2016 1542   LDLCALC 108 (H) 05/25/2016 1542     Wt Readings from Last 3 Encounters:  10/22/16 219 lb (99.3 kg)  10/03/16 217 lb (98.4 kg)  09/25/16 214 lb 15.2 oz (97.5 kg)      Other studies Reviewed: Additional studies/ records that were reviewed today include: TTE 01/25/16  Review of the above records today demonstrates:  - Left ventricle: There was mild concentric hypertrophy. Systolic   function was mildly reduced. The estimated ejection fraction was   in the range of 45% to 50%. Diffuse hypokinesis. Features are  consistent with a pseudonormal left ventricular filling pattern,   with concomitant abnormal relaxation and increased filling   pressure (grade 2 diastolic dysfunction). Doppler parameters are   consistent with both elevated ventricular end-diastolic filling   pressure and elevated left atrial filling pressure. - Aortic valve: Normal-sized, mildly calcified annulus. Trileaflet.   Moderate calcification involving the left coronary and   noncoronary cusp. There was very mild stenosis. Mean gradient   (S): 13 mm Hg. Peak gradient (S): 26 mm Hg. Valve area (VTI):   1.02 cm^2. Valve area (Vmax): 0.86 cm^2. Valve area (Vmean): 0.9   cm^2. - Mitral valve: Calcified annulus. There was mild regurgitation.   Valve area by continuity equation (using LVOT flow): 1.35 cm^2. - Left atrium: The atrium was mildly dilated. - Atrial septum: No defect or patent foramen ovale was identified.   ASSESSMENT AND PLAN:  1.  Atrial flutter: On Coumadin. Ablation 09/24/16. On coumadin.He has done well since ablation. We'll plan to stop his Coumadin today.  2. Hypertension: Well-controlled today. No changes necessary.  Current medicines are reviewed at length with the  patient today.   The patient does not have concerns regarding his medicines.  The following changes were made today:  Stop coumadin  Labs/ tests ordered today include:  Orders Placed This Encounter  Procedures  . EKG 12-Lead     Disposition:   FU with Claudetta Sallie 6 months  Signed, Miangel Flom Meredith Leeds, MD  10/22/2016 10:05 AM     [facility] [address] [place] [address] [phone] (office) [phone] (fax)

## 2016-10-22 ENCOUNTER — Encounter: Payer: Self-pay | Admitting: Cardiology

## 2016-10-22 ENCOUNTER — Encounter (INDEPENDENT_AMBULATORY_CARE_PROVIDER_SITE_OTHER): Payer: Self-pay

## 2016-10-22 ENCOUNTER — Ambulatory Visit (INDEPENDENT_AMBULATORY_CARE_PROVIDER_SITE_OTHER): Payer: Medicare Other | Admitting: Cardiology

## 2016-10-22 VITALS — BP 110/62 | HR 92 | Ht 66.5 in | Wt 219.0 lb

## 2016-10-22 DIAGNOSIS — Z8679 Personal history of other diseases of the circulatory system: Secondary | ICD-10-CM

## 2016-10-22 DIAGNOSIS — I483 Typical atrial flutter: Secondary | ICD-10-CM

## 2016-10-22 DIAGNOSIS — Z9889 Other specified postprocedural states: Secondary | ICD-10-CM

## 2016-10-22 NOTE — Patient Instructions (Addendum)
Medication Instructions:    Your physician has recommended you make the following change in your medication:  1) STOP Coumadin  --- If you need a refill on your cardiac medications before your next appointment, please call your pharmacy. ---  Labwork:  None ordered  Testing/Procedures:  None ordered  Follow-Up:  Your physician wants you to follow-up in: 6 months with Dr. Curt Bears.  You will receive a reminder letter in the mail two months in advance. If you don't receive a letter, please call our office to schedule the follow-up appointment.  Thank you for choosing [facility]!!   Trinidad Curet, RN [phone]

## 2016-10-23 DIAGNOSIS — D631 Anemia in chronic kidney disease: Secondary | ICD-10-CM | POA: Diagnosis not present

## 2016-10-23 DIAGNOSIS — R7881 Bacteremia: Secondary | ICD-10-CM | POA: Diagnosis not present

## 2016-10-23 DIAGNOSIS — N2581 Secondary hyperparathyroidism of renal origin: Secondary | ICD-10-CM | POA: Diagnosis not present

## 2016-10-23 DIAGNOSIS — N186 End stage renal disease: Secondary | ICD-10-CM | POA: Diagnosis not present

## 2016-10-23 DIAGNOSIS — E1029 Type 1 diabetes mellitus with other diabetic kidney complication: Secondary | ICD-10-CM | POA: Diagnosis not present

## 2016-10-23 DIAGNOSIS — I4891 Unspecified atrial fibrillation: Secondary | ICD-10-CM | POA: Diagnosis not present

## 2016-10-23 DIAGNOSIS — T8249XD Other complication of vascular dialysis catheter, subsequent encounter: Secondary | ICD-10-CM | POA: Diagnosis not present

## 2016-10-25 DIAGNOSIS — E1029 Type 1 diabetes mellitus with other diabetic kidney complication: Secondary | ICD-10-CM | POA: Diagnosis not present

## 2016-10-25 DIAGNOSIS — Z992 Dependence on renal dialysis: Secondary | ICD-10-CM | POA: Diagnosis not present

## 2016-10-25 DIAGNOSIS — D631 Anemia in chronic kidney disease: Secondary | ICD-10-CM | POA: Diagnosis not present

## 2016-10-25 DIAGNOSIS — N186 End stage renal disease: Secondary | ICD-10-CM | POA: Diagnosis not present

## 2016-10-25 DIAGNOSIS — T8610 Unspecified complication of kidney transplant: Secondary | ICD-10-CM | POA: Diagnosis not present

## 2016-10-25 DIAGNOSIS — T8249XD Other complication of vascular dialysis catheter, subsequent encounter: Secondary | ICD-10-CM | POA: Diagnosis not present

## 2016-10-25 DIAGNOSIS — N2581 Secondary hyperparathyroidism of renal origin: Secondary | ICD-10-CM | POA: Diagnosis not present

## 2016-10-25 DIAGNOSIS — R7881 Bacteremia: Secondary | ICD-10-CM | POA: Diagnosis not present

## 2016-10-28 DIAGNOSIS — N186 End stage renal disease: Secondary | ICD-10-CM | POA: Diagnosis not present

## 2016-10-28 DIAGNOSIS — E1029 Type 1 diabetes mellitus with other diabetic kidney complication: Secondary | ICD-10-CM | POA: Diagnosis not present

## 2016-10-28 DIAGNOSIS — N2581 Secondary hyperparathyroidism of renal origin: Secondary | ICD-10-CM | POA: Diagnosis not present

## 2016-10-28 DIAGNOSIS — D631 Anemia in chronic kidney disease: Secondary | ICD-10-CM | POA: Diagnosis not present

## 2016-10-30 DIAGNOSIS — N2581 Secondary hyperparathyroidism of renal origin: Secondary | ICD-10-CM | POA: Diagnosis not present

## 2016-10-30 DIAGNOSIS — N186 End stage renal disease: Secondary | ICD-10-CM | POA: Diagnosis not present

## 2016-10-30 DIAGNOSIS — E1029 Type 1 diabetes mellitus with other diabetic kidney complication: Secondary | ICD-10-CM | POA: Diagnosis not present

## 2016-10-30 DIAGNOSIS — D631 Anemia in chronic kidney disease: Secondary | ICD-10-CM | POA: Diagnosis not present

## 2016-11-01 DIAGNOSIS — D631 Anemia in chronic kidney disease: Secondary | ICD-10-CM | POA: Diagnosis not present

## 2016-11-01 DIAGNOSIS — N186 End stage renal disease: Secondary | ICD-10-CM | POA: Diagnosis not present

## 2016-11-01 DIAGNOSIS — N2581 Secondary hyperparathyroidism of renal origin: Secondary | ICD-10-CM | POA: Diagnosis not present

## 2016-11-01 DIAGNOSIS — E1029 Type 1 diabetes mellitus with other diabetic kidney complication: Secondary | ICD-10-CM | POA: Diagnosis not present

## 2016-11-04 DIAGNOSIS — E1029 Type 1 diabetes mellitus with other diabetic kidney complication: Secondary | ICD-10-CM | POA: Diagnosis not present

## 2016-11-04 DIAGNOSIS — N186 End stage renal disease: Secondary | ICD-10-CM | POA: Diagnosis not present

## 2016-11-04 DIAGNOSIS — N2581 Secondary hyperparathyroidism of renal origin: Secondary | ICD-10-CM | POA: Diagnosis not present

## 2016-11-04 DIAGNOSIS — D631 Anemia in chronic kidney disease: Secondary | ICD-10-CM | POA: Diagnosis not present

## 2016-11-06 DIAGNOSIS — N2581 Secondary hyperparathyroidism of renal origin: Secondary | ICD-10-CM | POA: Diagnosis not present

## 2016-11-06 DIAGNOSIS — E1029 Type 1 diabetes mellitus with other diabetic kidney complication: Secondary | ICD-10-CM | POA: Diagnosis not present

## 2016-11-06 DIAGNOSIS — D631 Anemia in chronic kidney disease: Secondary | ICD-10-CM | POA: Diagnosis not present

## 2016-11-06 DIAGNOSIS — N186 End stage renal disease: Secondary | ICD-10-CM | POA: Diagnosis not present

## 2016-11-08 DIAGNOSIS — E1029 Type 1 diabetes mellitus with other diabetic kidney complication: Secondary | ICD-10-CM | POA: Diagnosis not present

## 2016-11-08 DIAGNOSIS — D631 Anemia in chronic kidney disease: Secondary | ICD-10-CM | POA: Diagnosis not present

## 2016-11-08 DIAGNOSIS — N2581 Secondary hyperparathyroidism of renal origin: Secondary | ICD-10-CM | POA: Diagnosis not present

## 2016-11-08 DIAGNOSIS — N186 End stage renal disease: Secondary | ICD-10-CM | POA: Diagnosis not present

## 2016-11-11 DIAGNOSIS — D631 Anemia in chronic kidney disease: Secondary | ICD-10-CM | POA: Diagnosis not present

## 2016-11-11 DIAGNOSIS — E1029 Type 1 diabetes mellitus with other diabetic kidney complication: Secondary | ICD-10-CM | POA: Diagnosis not present

## 2016-11-11 DIAGNOSIS — N186 End stage renal disease: Secondary | ICD-10-CM | POA: Diagnosis not present

## 2016-11-11 DIAGNOSIS — N2581 Secondary hyperparathyroidism of renal origin: Secondary | ICD-10-CM | POA: Diagnosis not present

## 2016-11-13 DIAGNOSIS — D631 Anemia in chronic kidney disease: Secondary | ICD-10-CM | POA: Diagnosis not present

## 2016-11-13 DIAGNOSIS — E1029 Type 1 diabetes mellitus with other diabetic kidney complication: Secondary | ICD-10-CM | POA: Diagnosis not present

## 2016-11-13 DIAGNOSIS — N2581 Secondary hyperparathyroidism of renal origin: Secondary | ICD-10-CM | POA: Diagnosis not present

## 2016-11-13 DIAGNOSIS — N186 End stage renal disease: Secondary | ICD-10-CM | POA: Diagnosis not present

## 2016-11-15 DIAGNOSIS — D631 Anemia in chronic kidney disease: Secondary | ICD-10-CM | POA: Diagnosis not present

## 2016-11-15 DIAGNOSIS — N2581 Secondary hyperparathyroidism of renal origin: Secondary | ICD-10-CM | POA: Diagnosis not present

## 2016-11-15 DIAGNOSIS — E1029 Type 1 diabetes mellitus with other diabetic kidney complication: Secondary | ICD-10-CM | POA: Diagnosis not present

## 2016-11-15 DIAGNOSIS — N186 End stage renal disease: Secondary | ICD-10-CM | POA: Diagnosis not present

## 2016-11-18 DIAGNOSIS — E1029 Type 1 diabetes mellitus with other diabetic kidney complication: Secondary | ICD-10-CM | POA: Diagnosis not present

## 2016-11-18 DIAGNOSIS — N2581 Secondary hyperparathyroidism of renal origin: Secondary | ICD-10-CM | POA: Diagnosis not present

## 2016-11-18 DIAGNOSIS — N186 End stage renal disease: Secondary | ICD-10-CM | POA: Diagnosis not present

## 2016-11-18 DIAGNOSIS — D631 Anemia in chronic kidney disease: Secondary | ICD-10-CM | POA: Diagnosis not present

## 2016-11-20 DIAGNOSIS — D631 Anemia in chronic kidney disease: Secondary | ICD-10-CM | POA: Diagnosis not present

## 2016-11-20 DIAGNOSIS — N186 End stage renal disease: Secondary | ICD-10-CM | POA: Diagnosis not present

## 2016-11-20 DIAGNOSIS — I4891 Unspecified atrial fibrillation: Secondary | ICD-10-CM | POA: Diagnosis not present

## 2016-11-20 DIAGNOSIS — N2581 Secondary hyperparathyroidism of renal origin: Secondary | ICD-10-CM | POA: Diagnosis not present

## 2016-11-20 DIAGNOSIS — E1029 Type 1 diabetes mellitus with other diabetic kidney complication: Secondary | ICD-10-CM | POA: Diagnosis not present

## 2016-11-22 DIAGNOSIS — D631 Anemia in chronic kidney disease: Secondary | ICD-10-CM | POA: Diagnosis not present

## 2016-11-22 DIAGNOSIS — N2581 Secondary hyperparathyroidism of renal origin: Secondary | ICD-10-CM | POA: Diagnosis not present

## 2016-11-22 DIAGNOSIS — E1029 Type 1 diabetes mellitus with other diabetic kidney complication: Secondary | ICD-10-CM | POA: Diagnosis not present

## 2016-11-22 DIAGNOSIS — N186 End stage renal disease: Secondary | ICD-10-CM | POA: Diagnosis not present

## 2016-11-24 DIAGNOSIS — Z992 Dependence on renal dialysis: Secondary | ICD-10-CM | POA: Diagnosis not present

## 2016-11-24 DIAGNOSIS — T8610 Unspecified complication of kidney transplant: Secondary | ICD-10-CM | POA: Diagnosis not present

## 2016-11-24 DIAGNOSIS — N186 End stage renal disease: Secondary | ICD-10-CM | POA: Diagnosis not present

## 2016-11-25 DIAGNOSIS — D631 Anemia in chronic kidney disease: Secondary | ICD-10-CM | POA: Diagnosis not present

## 2016-11-25 DIAGNOSIS — E1029 Type 1 diabetes mellitus with other diabetic kidney complication: Secondary | ICD-10-CM | POA: Diagnosis not present

## 2016-11-25 DIAGNOSIS — N2581 Secondary hyperparathyroidism of renal origin: Secondary | ICD-10-CM | POA: Diagnosis not present

## 2016-11-25 DIAGNOSIS — N186 End stage renal disease: Secondary | ICD-10-CM | POA: Diagnosis not present

## 2016-11-27 DIAGNOSIS — D631 Anemia in chronic kidney disease: Secondary | ICD-10-CM | POA: Diagnosis not present

## 2016-11-27 DIAGNOSIS — N2581 Secondary hyperparathyroidism of renal origin: Secondary | ICD-10-CM | POA: Diagnosis not present

## 2016-11-27 DIAGNOSIS — E1029 Type 1 diabetes mellitus with other diabetic kidney complication: Secondary | ICD-10-CM | POA: Diagnosis not present

## 2016-11-27 DIAGNOSIS — N186 End stage renal disease: Secondary | ICD-10-CM | POA: Diagnosis not present

## 2016-11-29 DIAGNOSIS — D631 Anemia in chronic kidney disease: Secondary | ICD-10-CM | POA: Diagnosis not present

## 2016-11-29 DIAGNOSIS — E1029 Type 1 diabetes mellitus with other diabetic kidney complication: Secondary | ICD-10-CM | POA: Diagnosis not present

## 2016-11-29 DIAGNOSIS — N2581 Secondary hyperparathyroidism of renal origin: Secondary | ICD-10-CM | POA: Diagnosis not present

## 2016-11-29 DIAGNOSIS — N186 End stage renal disease: Secondary | ICD-10-CM | POA: Diagnosis not present

## 2016-12-02 DIAGNOSIS — N186 End stage renal disease: Secondary | ICD-10-CM | POA: Diagnosis not present

## 2016-12-02 DIAGNOSIS — D631 Anemia in chronic kidney disease: Secondary | ICD-10-CM | POA: Diagnosis not present

## 2016-12-02 DIAGNOSIS — N2581 Secondary hyperparathyroidism of renal origin: Secondary | ICD-10-CM | POA: Diagnosis not present

## 2016-12-02 DIAGNOSIS — E1029 Type 1 diabetes mellitus with other diabetic kidney complication: Secondary | ICD-10-CM | POA: Diagnosis not present

## 2016-12-04 DIAGNOSIS — N186 End stage renal disease: Secondary | ICD-10-CM | POA: Diagnosis not present

## 2016-12-04 DIAGNOSIS — D631 Anemia in chronic kidney disease: Secondary | ICD-10-CM | POA: Diagnosis not present

## 2016-12-04 DIAGNOSIS — N2581 Secondary hyperparathyroidism of renal origin: Secondary | ICD-10-CM | POA: Diagnosis not present

## 2016-12-04 DIAGNOSIS — E1029 Type 1 diabetes mellitus with other diabetic kidney complication: Secondary | ICD-10-CM | POA: Diagnosis not present

## 2016-12-06 DIAGNOSIS — N186 End stage renal disease: Secondary | ICD-10-CM | POA: Diagnosis not present

## 2016-12-06 DIAGNOSIS — D631 Anemia in chronic kidney disease: Secondary | ICD-10-CM | POA: Diagnosis not present

## 2016-12-06 DIAGNOSIS — N2581 Secondary hyperparathyroidism of renal origin: Secondary | ICD-10-CM | POA: Diagnosis not present

## 2016-12-06 DIAGNOSIS — E1029 Type 1 diabetes mellitus with other diabetic kidney complication: Secondary | ICD-10-CM | POA: Diagnosis not present

## 2016-12-09 DIAGNOSIS — N2581 Secondary hyperparathyroidism of renal origin: Secondary | ICD-10-CM | POA: Diagnosis not present

## 2016-12-09 DIAGNOSIS — D631 Anemia in chronic kidney disease: Secondary | ICD-10-CM | POA: Diagnosis not present

## 2016-12-09 DIAGNOSIS — N186 End stage renal disease: Secondary | ICD-10-CM | POA: Diagnosis not present

## 2016-12-09 DIAGNOSIS — E1029 Type 1 diabetes mellitus with other diabetic kidney complication: Secondary | ICD-10-CM | POA: Diagnosis not present

## 2016-12-11 DIAGNOSIS — E1029 Type 1 diabetes mellitus with other diabetic kidney complication: Secondary | ICD-10-CM | POA: Diagnosis not present

## 2016-12-11 DIAGNOSIS — N2581 Secondary hyperparathyroidism of renal origin: Secondary | ICD-10-CM | POA: Diagnosis not present

## 2016-12-11 DIAGNOSIS — D631 Anemia in chronic kidney disease: Secondary | ICD-10-CM | POA: Diagnosis not present

## 2016-12-11 DIAGNOSIS — N186 End stage renal disease: Secondary | ICD-10-CM | POA: Diagnosis not present

## 2016-12-13 DIAGNOSIS — E1029 Type 1 diabetes mellitus with other diabetic kidney complication: Secondary | ICD-10-CM | POA: Diagnosis not present

## 2016-12-13 DIAGNOSIS — D631 Anemia in chronic kidney disease: Secondary | ICD-10-CM | POA: Diagnosis not present

## 2016-12-13 DIAGNOSIS — N2581 Secondary hyperparathyroidism of renal origin: Secondary | ICD-10-CM | POA: Diagnosis not present

## 2016-12-13 DIAGNOSIS — N186 End stage renal disease: Secondary | ICD-10-CM | POA: Diagnosis not present

## 2016-12-18 DIAGNOSIS — D631 Anemia in chronic kidney disease: Secondary | ICD-10-CM | POA: Diagnosis not present

## 2016-12-18 DIAGNOSIS — I4891 Unspecified atrial fibrillation: Secondary | ICD-10-CM | POA: Diagnosis not present

## 2016-12-18 DIAGNOSIS — N186 End stage renal disease: Secondary | ICD-10-CM | POA: Diagnosis not present

## 2016-12-18 DIAGNOSIS — N2581 Secondary hyperparathyroidism of renal origin: Secondary | ICD-10-CM | POA: Diagnosis not present

## 2016-12-18 DIAGNOSIS — E1029 Type 1 diabetes mellitus with other diabetic kidney complication: Secondary | ICD-10-CM | POA: Diagnosis not present

## 2016-12-20 DIAGNOSIS — D631 Anemia in chronic kidney disease: Secondary | ICD-10-CM | POA: Diagnosis not present

## 2016-12-20 DIAGNOSIS — N2581 Secondary hyperparathyroidism of renal origin: Secondary | ICD-10-CM | POA: Diagnosis not present

## 2016-12-20 DIAGNOSIS — N186 End stage renal disease: Secondary | ICD-10-CM | POA: Diagnosis not present

## 2016-12-20 DIAGNOSIS — E1029 Type 1 diabetes mellitus with other diabetic kidney complication: Secondary | ICD-10-CM | POA: Diagnosis not present

## 2016-12-23 DIAGNOSIS — N2581 Secondary hyperparathyroidism of renal origin: Secondary | ICD-10-CM | POA: Diagnosis not present

## 2016-12-23 DIAGNOSIS — D631 Anemia in chronic kidney disease: Secondary | ICD-10-CM | POA: Diagnosis not present

## 2016-12-23 DIAGNOSIS — N186 End stage renal disease: Secondary | ICD-10-CM | POA: Diagnosis not present

## 2016-12-23 DIAGNOSIS — E1029 Type 1 diabetes mellitus with other diabetic kidney complication: Secondary | ICD-10-CM | POA: Diagnosis not present

## 2016-12-25 DIAGNOSIS — T8610 Unspecified complication of kidney transplant: Secondary | ICD-10-CM | POA: Diagnosis not present

## 2016-12-25 DIAGNOSIS — D631 Anemia in chronic kidney disease: Secondary | ICD-10-CM | POA: Diagnosis not present

## 2016-12-25 DIAGNOSIS — N186 End stage renal disease: Secondary | ICD-10-CM | POA: Diagnosis not present

## 2016-12-25 DIAGNOSIS — E1029 Type 1 diabetes mellitus with other diabetic kidney complication: Secondary | ICD-10-CM | POA: Diagnosis not present

## 2016-12-25 DIAGNOSIS — Z992 Dependence on renal dialysis: Secondary | ICD-10-CM | POA: Diagnosis not present

## 2016-12-25 DIAGNOSIS — N2581 Secondary hyperparathyroidism of renal origin: Secondary | ICD-10-CM | POA: Diagnosis not present

## 2016-12-27 DIAGNOSIS — D631 Anemia in chronic kidney disease: Secondary | ICD-10-CM | POA: Diagnosis not present

## 2016-12-27 DIAGNOSIS — N2581 Secondary hyperparathyroidism of renal origin: Secondary | ICD-10-CM | POA: Diagnosis not present

## 2016-12-27 DIAGNOSIS — N186 End stage renal disease: Secondary | ICD-10-CM | POA: Diagnosis not present

## 2016-12-27 DIAGNOSIS — E1029 Type 1 diabetes mellitus with other diabetic kidney complication: Secondary | ICD-10-CM | POA: Diagnosis not present

## 2016-12-30 DIAGNOSIS — E1029 Type 1 diabetes mellitus with other diabetic kidney complication: Secondary | ICD-10-CM | POA: Diagnosis not present

## 2016-12-30 DIAGNOSIS — N2581 Secondary hyperparathyroidism of renal origin: Secondary | ICD-10-CM | POA: Diagnosis not present

## 2016-12-30 DIAGNOSIS — D631 Anemia in chronic kidney disease: Secondary | ICD-10-CM | POA: Diagnosis not present

## 2016-12-30 DIAGNOSIS — N186 End stage renal disease: Secondary | ICD-10-CM | POA: Diagnosis not present

## 2017-01-01 DIAGNOSIS — D631 Anemia in chronic kidney disease: Secondary | ICD-10-CM | POA: Diagnosis not present

## 2017-01-01 DIAGNOSIS — N2581 Secondary hyperparathyroidism of renal origin: Secondary | ICD-10-CM | POA: Diagnosis not present

## 2017-01-01 DIAGNOSIS — E1029 Type 1 diabetes mellitus with other diabetic kidney complication: Secondary | ICD-10-CM | POA: Diagnosis not present

## 2017-01-01 DIAGNOSIS — N186 End stage renal disease: Secondary | ICD-10-CM | POA: Diagnosis not present

## 2017-01-03 DIAGNOSIS — E1029 Type 1 diabetes mellitus with other diabetic kidney complication: Secondary | ICD-10-CM | POA: Diagnosis not present

## 2017-01-03 DIAGNOSIS — N2581 Secondary hyperparathyroidism of renal origin: Secondary | ICD-10-CM | POA: Diagnosis not present

## 2017-01-03 DIAGNOSIS — N186 End stage renal disease: Secondary | ICD-10-CM | POA: Diagnosis not present

## 2017-01-03 DIAGNOSIS — D631 Anemia in chronic kidney disease: Secondary | ICD-10-CM | POA: Diagnosis not present

## 2017-01-06 DIAGNOSIS — D631 Anemia in chronic kidney disease: Secondary | ICD-10-CM | POA: Diagnosis not present

## 2017-01-06 DIAGNOSIS — E1029 Type 1 diabetes mellitus with other diabetic kidney complication: Secondary | ICD-10-CM | POA: Diagnosis not present

## 2017-01-06 DIAGNOSIS — N2581 Secondary hyperparathyroidism of renal origin: Secondary | ICD-10-CM | POA: Diagnosis not present

## 2017-01-06 DIAGNOSIS — N186 End stage renal disease: Secondary | ICD-10-CM | POA: Diagnosis not present

## 2017-01-08 DIAGNOSIS — N186 End stage renal disease: Secondary | ICD-10-CM | POA: Diagnosis not present

## 2017-01-08 DIAGNOSIS — D631 Anemia in chronic kidney disease: Secondary | ICD-10-CM | POA: Diagnosis not present

## 2017-01-08 DIAGNOSIS — N2581 Secondary hyperparathyroidism of renal origin: Secondary | ICD-10-CM | POA: Diagnosis not present

## 2017-01-08 DIAGNOSIS — E1029 Type 1 diabetes mellitus with other diabetic kidney complication: Secondary | ICD-10-CM | POA: Diagnosis not present

## 2017-01-10 DIAGNOSIS — E1029 Type 1 diabetes mellitus with other diabetic kidney complication: Secondary | ICD-10-CM | POA: Diagnosis not present

## 2017-01-10 DIAGNOSIS — N186 End stage renal disease: Secondary | ICD-10-CM | POA: Diagnosis not present

## 2017-01-10 DIAGNOSIS — D631 Anemia in chronic kidney disease: Secondary | ICD-10-CM | POA: Diagnosis not present

## 2017-01-10 DIAGNOSIS — N2581 Secondary hyperparathyroidism of renal origin: Secondary | ICD-10-CM | POA: Diagnosis not present

## 2017-01-13 DIAGNOSIS — N2581 Secondary hyperparathyroidism of renal origin: Secondary | ICD-10-CM | POA: Diagnosis not present

## 2017-01-13 DIAGNOSIS — N186 End stage renal disease: Secondary | ICD-10-CM | POA: Diagnosis not present

## 2017-01-13 DIAGNOSIS — D631 Anemia in chronic kidney disease: Secondary | ICD-10-CM | POA: Diagnosis not present

## 2017-01-13 DIAGNOSIS — E1029 Type 1 diabetes mellitus with other diabetic kidney complication: Secondary | ICD-10-CM | POA: Diagnosis not present

## 2017-01-15 DIAGNOSIS — N186 End stage renal disease: Secondary | ICD-10-CM | POA: Diagnosis not present

## 2017-01-15 DIAGNOSIS — N2581 Secondary hyperparathyroidism of renal origin: Secondary | ICD-10-CM | POA: Diagnosis not present

## 2017-01-15 DIAGNOSIS — D631 Anemia in chronic kidney disease: Secondary | ICD-10-CM | POA: Diagnosis not present

## 2017-01-15 DIAGNOSIS — E1029 Type 1 diabetes mellitus with other diabetic kidney complication: Secondary | ICD-10-CM | POA: Diagnosis not present

## 2017-01-17 DIAGNOSIS — N186 End stage renal disease: Secondary | ICD-10-CM | POA: Diagnosis not present

## 2017-01-17 DIAGNOSIS — N2581 Secondary hyperparathyroidism of renal origin: Secondary | ICD-10-CM | POA: Diagnosis not present

## 2017-01-17 DIAGNOSIS — E1029 Type 1 diabetes mellitus with other diabetic kidney complication: Secondary | ICD-10-CM | POA: Diagnosis not present

## 2017-01-17 DIAGNOSIS — D631 Anemia in chronic kidney disease: Secondary | ICD-10-CM | POA: Diagnosis not present

## 2017-01-20 DIAGNOSIS — N2581 Secondary hyperparathyroidism of renal origin: Secondary | ICD-10-CM | POA: Diagnosis not present

## 2017-01-20 DIAGNOSIS — N186 End stage renal disease: Secondary | ICD-10-CM | POA: Diagnosis not present

## 2017-01-20 DIAGNOSIS — E1029 Type 1 diabetes mellitus with other diabetic kidney complication: Secondary | ICD-10-CM | POA: Diagnosis not present

## 2017-01-20 DIAGNOSIS — D631 Anemia in chronic kidney disease: Secondary | ICD-10-CM | POA: Diagnosis not present

## 2017-01-22 DIAGNOSIS — N186 End stage renal disease: Secondary | ICD-10-CM | POA: Diagnosis not present

## 2017-01-22 DIAGNOSIS — N2581 Secondary hyperparathyroidism of renal origin: Secondary | ICD-10-CM | POA: Diagnosis not present

## 2017-01-22 DIAGNOSIS — I4891 Unspecified atrial fibrillation: Secondary | ICD-10-CM | POA: Diagnosis not present

## 2017-01-22 DIAGNOSIS — D631 Anemia in chronic kidney disease: Secondary | ICD-10-CM | POA: Diagnosis not present

## 2017-01-22 DIAGNOSIS — E1029 Type 1 diabetes mellitus with other diabetic kidney complication: Secondary | ICD-10-CM | POA: Diagnosis not present

## 2017-01-24 DIAGNOSIS — E1029 Type 1 diabetes mellitus with other diabetic kidney complication: Secondary | ICD-10-CM | POA: Diagnosis not present

## 2017-01-24 DIAGNOSIS — D631 Anemia in chronic kidney disease: Secondary | ICD-10-CM | POA: Diagnosis not present

## 2017-01-24 DIAGNOSIS — N186 End stage renal disease: Secondary | ICD-10-CM | POA: Diagnosis not present

## 2017-01-24 DIAGNOSIS — Z992 Dependence on renal dialysis: Secondary | ICD-10-CM | POA: Diagnosis not present

## 2017-01-24 DIAGNOSIS — N2581 Secondary hyperparathyroidism of renal origin: Secondary | ICD-10-CM | POA: Diagnosis not present

## 2017-01-24 DIAGNOSIS — T8610 Unspecified complication of kidney transplant: Secondary | ICD-10-CM | POA: Diagnosis not present

## 2017-01-27 DIAGNOSIS — E1029 Type 1 diabetes mellitus with other diabetic kidney complication: Secondary | ICD-10-CM | POA: Diagnosis not present

## 2017-01-27 DIAGNOSIS — N186 End stage renal disease: Secondary | ICD-10-CM | POA: Diagnosis not present

## 2017-01-27 DIAGNOSIS — D631 Anemia in chronic kidney disease: Secondary | ICD-10-CM | POA: Diagnosis not present

## 2017-01-27 DIAGNOSIS — N2581 Secondary hyperparathyroidism of renal origin: Secondary | ICD-10-CM | POA: Diagnosis not present

## 2017-01-29 DIAGNOSIS — N186 End stage renal disease: Secondary | ICD-10-CM | POA: Diagnosis not present

## 2017-01-29 DIAGNOSIS — E1029 Type 1 diabetes mellitus with other diabetic kidney complication: Secondary | ICD-10-CM | POA: Diagnosis not present

## 2017-01-29 DIAGNOSIS — D631 Anemia in chronic kidney disease: Secondary | ICD-10-CM | POA: Diagnosis not present

## 2017-01-29 DIAGNOSIS — N2581 Secondary hyperparathyroidism of renal origin: Secondary | ICD-10-CM | POA: Diagnosis not present

## 2017-01-31 DIAGNOSIS — D631 Anemia in chronic kidney disease: Secondary | ICD-10-CM | POA: Diagnosis not present

## 2017-01-31 DIAGNOSIS — N2581 Secondary hyperparathyroidism of renal origin: Secondary | ICD-10-CM | POA: Diagnosis not present

## 2017-01-31 DIAGNOSIS — E1029 Type 1 diabetes mellitus with other diabetic kidney complication: Secondary | ICD-10-CM | POA: Diagnosis not present

## 2017-01-31 DIAGNOSIS — N186 End stage renal disease: Secondary | ICD-10-CM | POA: Diagnosis not present

## 2017-02-02 ENCOUNTER — Ambulatory Visit (INDEPENDENT_AMBULATORY_CARE_PROVIDER_SITE_OTHER): Payer: Medicare Other | Admitting: Endocrinology

## 2017-02-02 ENCOUNTER — Encounter: Payer: Self-pay | Admitting: Endocrinology

## 2017-02-02 VITALS — BP 112/62 | HR 89 | Ht 66.5 in | Wt 222.0 lb

## 2017-02-02 DIAGNOSIS — I739 Peripheral vascular disease, unspecified: Secondary | ICD-10-CM

## 2017-02-02 DIAGNOSIS — N184 Chronic kidney disease, stage 4 (severe): Secondary | ICD-10-CM

## 2017-02-02 DIAGNOSIS — E1122 Type 2 diabetes mellitus with diabetic chronic kidney disease: Secondary | ICD-10-CM | POA: Diagnosis not present

## 2017-02-02 LAB — POCT GLYCOSYLATED HEMOGLOBIN (HGB A1C): Hemoglobin A1C: 6

## 2017-02-02 NOTE — Patient Instructions (Addendum)
No medication is needed for the diabetes now.  check your blood sugar once a day.  vary the time of day when you check, between before the 3 meals, and at bedtime.  also check if you have symptoms of your blood sugar being too high or too low.  please keep a record of the readings and bring it to your next appointment here.  You can write it on any piece of paper.  please call us sooner if your blood sugar goes below 70, or if you have a lot of readings over 200.   Please see a blood vessel specialist.  you will receive a phone call, about a day and time for an appointment Please come back for a follow-up appointment in 4 months.

## 2017-02-02 NOTE — Progress Notes (Signed)
Subjective:    Patient ID: Patrick [name], male    DOB: [birth year], 61 y.o.   MRN: [id number]  HPI Pt returns for f/u of diabetes mellitus: DM type: 2 Dx'ed: 2014, when he presented with severe hyperglycemia.   Complications: polyneuropathy, PAD, and renal failure.  Therapy: no medication now.  DKA: never. Severe hypoglycemia: never. Pancreatitis: never. Other: he took insulin 2014-2016; he had renal transplant in 2009, but renal failure has recurred.  He is back on dialysis.  Fructosamine has suggested a1c approx 1% higher than a1c itself.  Interval history: pt states he feels well in general. no cbg record, but states cbg's are well-controlled. Pt says he was dx'ed with PAD, based on Ca++ vessels.   Past Medical History:  Diagnosis Date  . Arthritis    "back" (09/24/2016)  . ESRD (end stage renal disease) on dialysis [facility])    "TTS; [place]" (09/24/2016)  . Gout   . History of blood transfusion 2009- 2016   "I've had several; low HgB"  . History of kidney stones    treated with nephrectomy  . Hypertension   . Impotence of organic origin   . Migraine    "stopped in my 30's; they were related to high BP" (09/24/2016)  . Myocardial infarction [facility]) '96  . Pneumonia 01/2015   " a touch & I was in the hosp."  . Secondary hyperparathyroidism ([place])   . Type 2 diabetes mellitus ([place])     Past Surgical History:  Procedure Laterality Date  . A-FLUTTER ABLATION N/A 09/24/2016   Procedure: A-Flutter Ablation;  Surgeon: Will Meredith Leeds, MD;  Location: [place] [facility];  Service: Cardiovascular;  Laterality: N/A;  . APPENDECTOMY    . AV FISTULA PLACEMENT Right 09/18/2014   Procedure: INSERTION OF ARTERIOVENOUS (AV) GORE-TEX GRAFT ARM USING 4-7MM  X 45CM STRETCH GORE-TEX VASCULAR GRAFT;  Surgeon: Rosetta Posner, MD;  Location: [place];  Service: Vascular;  Laterality: Right;  . AV FISTULA PLACEMENT Left 07/07/2016   Procedure: INSERTION OF LEFT BRACHIAL TO AXILLARY ARTERIOVENOUS (AV)  GORE-TEX ARM GRAFT;  Surgeon: Conrad [place], MD;  Location: [place];  Service: Vascular;  Laterality: Left;  . CARDIAC CATHETERIZATION  ~ 2016  . 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 HERNIA REPAIR  2017   with nephrectomy  . IR GENERIC HISTORICAL  05/11/2016   IR FLUORO GUIDE CV LINE LEFT 05/11/2016 Corrie Mckusick, DO [facility]  . IR GENERIC HISTORICAL  05/11/2016   IR US GUIDE VASC ACCESS LEFT 05/11/2016 Corrie Mckusick, DO [facility]  . IR GENERIC HISTORICAL  05/11/2016   IR US GUIDE VASC ACCESS RIGHT 05/11/2016 Corrie Mckusick, DO [facility]  . IR GENERIC HISTORICAL  05/11/2016   IR RADIOLOGY PERIPHERAL GUIDED IV START 05/11/2016 Corrie Mckusick, DO [facility]  . KIDNEY TRANSPLANT  2009  . NEPHRECTOMY  2017   transplant rejected   . PERIPHERAL VASCULAR CATHETERIZATION N/A 06/04/2016   Procedure: Upper Extremity Venography;  Surgeon: Waynetta Sandy, MD;  Location: [place] [facility];  Service: Cardiovascular;  Laterality: N/A;    Social History   Social History  . Marital status: Married    Spouse name: N/A  . Number of children: N/A  . Years of education: N/A   Occupational History  . Not on file.   Social History Main Topics  . Smoking status: Former Smoker    Types: Cigarettes  Quit date: 07/04/2014  . Smokeless tobacco: Never Used     Comment: "smoked ~ 1 pack/month when I did smoke; never a steady smoker"  . Alcohol use 0.0 oz/week     Comment: 09/24/2016 "2 drinks, 1-2 times/year"  . Drug use: No  . Sexual activity: Not Currently    Birth control/ protection: None   Other Topics Concern  . Not on file   Social History Narrative  . No narrative on file    Current Outpatient Prescriptions on File Prior to Visit  Medication Sig Dispense Refill  . acetaminophen (TYLENOL) 500 MG tablet Take 1,000 mg by mouth every 6 (six) hours as needed for moderate pain or headache.    .  albuterol (PROVENTIL HFA;VENTOLIN HFA) 108 (90 Base) MCG/ACT inhaler Inhale 1-2 puffs into the lungs every 6 (six) hours as needed for wheezing or shortness of breath.    . calcium carbonate (OS-CAL - DOSED IN MG OF ELEMENTAL CALCIUM) 1250 (500 Ca) MG tablet Take 1 tablet by mouth once a week.     . colchicine 0.6 MG tablet Take 1 tablet (0.6 mg total) by mouth every 14 (fourteen) days.    . isosorbide mononitrate (IMDUR) 30 MG 24 hr tablet Take 1 tablet (30 mg total) by mouth daily. 30 tablet 0   No current facility-administered medications on file prior to visit.     Allergies  Allergen Reactions  . Lipitor [Atorvastatin]     Leg pain  . Metoprolol     Headaches   . Iodinated Diagnostic Agents Rash    Other Reaction: burning to mouth, swelling of lips.    Family History  Problem Relation Age of Onset  . Hyperlipidemia Mother   . Hypertension Mother     BP 112/62   Pulse 89   Ht 5' 6.5" (1.689 m)   Wt 222 lb (100.7 kg)   SpO2 97%   BMI 35.29 kg/m    Review of Systems Denies leg edema.     Objective:   Physical Exam VITAL SIGNS:  See vs page.  GENERAL: no distress.  Pulses: dorsalis pedis intact bilat.   MSK: no deformity of the feet.  CV: no leg edema.  Skin:  no ulcer on the feet.  normal color and temp on the feet.  Neuro: sensation is intact to touch on the feet, but decreased from normal.  Ext: There is bilateral onychomycosis of the toenails.    Lab Results  Component Value Date   HGBA1C 6.0 02/02/2017      Assessment & Plan:  Type 2 DM: no medication is needed ESRD: in this setting, a1c overestimates glycemic control. PAD: new to me.    Patient Instructions  No medication is needed for the diabetes now.  check your blood sugar once a day.  vary the time of day when you check, between before the 3 meals, and at bedtime.  also check if you have symptoms of your blood sugar being too high or too low.  please keep a record of the readings and bring it to  your next appointment here.  You can write it on any piece of paper.  please call us sooner if your blood sugar goes below 70, or if you have a lot of readings over 200.   Please see a blood vessel specialist.  you will receive a phone call, about a day and time for an appointment Please come back for a follow-up appointment in 4 months.

## 2017-02-03 DIAGNOSIS — N2581 Secondary hyperparathyroidism of renal origin: Secondary | ICD-10-CM | POA: Diagnosis not present

## 2017-02-03 DIAGNOSIS — E1029 Type 1 diabetes mellitus with other diabetic kidney complication: Secondary | ICD-10-CM | POA: Diagnosis not present

## 2017-02-03 DIAGNOSIS — D631 Anemia in chronic kidney disease: Secondary | ICD-10-CM | POA: Diagnosis not present

## 2017-02-03 DIAGNOSIS — N186 End stage renal disease: Secondary | ICD-10-CM | POA: Diagnosis not present

## 2017-02-05 ENCOUNTER — Other Ambulatory Visit: Payer: Self-pay

## 2017-02-05 DIAGNOSIS — N2581 Secondary hyperparathyroidism of renal origin: Secondary | ICD-10-CM | POA: Diagnosis not present

## 2017-02-05 DIAGNOSIS — E1029 Type 1 diabetes mellitus with other diabetic kidney complication: Secondary | ICD-10-CM | POA: Diagnosis not present

## 2017-02-05 DIAGNOSIS — D631 Anemia in chronic kidney disease: Secondary | ICD-10-CM | POA: Diagnosis not present

## 2017-02-05 DIAGNOSIS — N186 End stage renal disease: Secondary | ICD-10-CM | POA: Diagnosis not present

## 2017-02-05 DIAGNOSIS — I7092 Chronic total occlusion of artery of the extremities: Secondary | ICD-10-CM

## 2017-02-07 DIAGNOSIS — D631 Anemia in chronic kidney disease: Secondary | ICD-10-CM | POA: Diagnosis not present

## 2017-02-07 DIAGNOSIS — N2581 Secondary hyperparathyroidism of renal origin: Secondary | ICD-10-CM | POA: Diagnosis not present

## 2017-02-07 DIAGNOSIS — E1029 Type 1 diabetes mellitus with other diabetic kidney complication: Secondary | ICD-10-CM | POA: Diagnosis not present

## 2017-02-07 DIAGNOSIS — N186 End stage renal disease: Secondary | ICD-10-CM | POA: Diagnosis not present

## 2017-02-10 DIAGNOSIS — N186 End stage renal disease: Secondary | ICD-10-CM | POA: Diagnosis not present

## 2017-02-10 DIAGNOSIS — D631 Anemia in chronic kidney disease: Secondary | ICD-10-CM | POA: Diagnosis not present

## 2017-02-10 DIAGNOSIS — E1029 Type 1 diabetes mellitus with other diabetic kidney complication: Secondary | ICD-10-CM | POA: Diagnosis not present

## 2017-02-10 DIAGNOSIS — N2581 Secondary hyperparathyroidism of renal origin: Secondary | ICD-10-CM | POA: Diagnosis not present

## 2017-02-12 DIAGNOSIS — D631 Anemia in chronic kidney disease: Secondary | ICD-10-CM | POA: Diagnosis not present

## 2017-02-12 DIAGNOSIS — E1029 Type 1 diabetes mellitus with other diabetic kidney complication: Secondary | ICD-10-CM | POA: Diagnosis not present

## 2017-02-12 DIAGNOSIS — N186 End stage renal disease: Secondary | ICD-10-CM | POA: Diagnosis not present

## 2017-02-12 DIAGNOSIS — N2581 Secondary hyperparathyroidism of renal origin: Secondary | ICD-10-CM | POA: Diagnosis not present

## 2017-02-14 DIAGNOSIS — N2581 Secondary hyperparathyroidism of renal origin: Secondary | ICD-10-CM | POA: Diagnosis not present

## 2017-02-14 DIAGNOSIS — N186 End stage renal disease: Secondary | ICD-10-CM | POA: Diagnosis not present

## 2017-02-14 DIAGNOSIS — E1029 Type 1 diabetes mellitus with other diabetic kidney complication: Secondary | ICD-10-CM | POA: Diagnosis not present

## 2017-02-14 DIAGNOSIS — D631 Anemia in chronic kidney disease: Secondary | ICD-10-CM | POA: Diagnosis not present

## 2017-02-19 DIAGNOSIS — N2581 Secondary hyperparathyroidism of renal origin: Secondary | ICD-10-CM | POA: Diagnosis not present

## 2017-02-19 DIAGNOSIS — D631 Anemia in chronic kidney disease: Secondary | ICD-10-CM | POA: Diagnosis not present

## 2017-02-19 DIAGNOSIS — I4891 Unspecified atrial fibrillation: Secondary | ICD-10-CM | POA: Diagnosis not present

## 2017-02-19 DIAGNOSIS — N186 End stage renal disease: Secondary | ICD-10-CM | POA: Diagnosis not present

## 2017-02-19 DIAGNOSIS — E1029 Type 1 diabetes mellitus with other diabetic kidney complication: Secondary | ICD-10-CM | POA: Diagnosis not present

## 2017-02-21 DIAGNOSIS — N2581 Secondary hyperparathyroidism of renal origin: Secondary | ICD-10-CM | POA: Diagnosis not present

## 2017-02-21 DIAGNOSIS — E1029 Type 1 diabetes mellitus with other diabetic kidney complication: Secondary | ICD-10-CM | POA: Diagnosis not present

## 2017-02-21 DIAGNOSIS — D631 Anemia in chronic kidney disease: Secondary | ICD-10-CM | POA: Diagnosis not present

## 2017-02-21 DIAGNOSIS — N186 End stage renal disease: Secondary | ICD-10-CM | POA: Diagnosis not present

## 2017-02-24 DIAGNOSIS — N2581 Secondary hyperparathyroidism of renal origin: Secondary | ICD-10-CM | POA: Diagnosis not present

## 2017-02-24 DIAGNOSIS — E1029 Type 1 diabetes mellitus with other diabetic kidney complication: Secondary | ICD-10-CM | POA: Diagnosis not present

## 2017-02-24 DIAGNOSIS — N186 End stage renal disease: Secondary | ICD-10-CM | POA: Diagnosis not present

## 2017-02-24 DIAGNOSIS — D631 Anemia in chronic kidney disease: Secondary | ICD-10-CM | POA: Diagnosis not present

## 2017-02-24 DIAGNOSIS — T8610 Unspecified complication of kidney transplant: Secondary | ICD-10-CM | POA: Diagnosis not present

## 2017-02-24 DIAGNOSIS — Z992 Dependence on renal dialysis: Secondary | ICD-10-CM | POA: Diagnosis not present

## 2017-02-26 DIAGNOSIS — N186 End stage renal disease: Secondary | ICD-10-CM | POA: Diagnosis not present

## 2017-02-26 DIAGNOSIS — E1029 Type 1 diabetes mellitus with other diabetic kidney complication: Secondary | ICD-10-CM | POA: Diagnosis not present

## 2017-02-26 DIAGNOSIS — N2581 Secondary hyperparathyroidism of renal origin: Secondary | ICD-10-CM | POA: Diagnosis not present

## 2017-02-26 DIAGNOSIS — D631 Anemia in chronic kidney disease: Secondary | ICD-10-CM | POA: Diagnosis not present

## 2017-02-28 DIAGNOSIS — E1029 Type 1 diabetes mellitus with other diabetic kidney complication: Secondary | ICD-10-CM | POA: Diagnosis not present

## 2017-02-28 DIAGNOSIS — N2581 Secondary hyperparathyroidism of renal origin: Secondary | ICD-10-CM | POA: Diagnosis not present

## 2017-02-28 DIAGNOSIS — N186 End stage renal disease: Secondary | ICD-10-CM | POA: Diagnosis not present

## 2017-02-28 DIAGNOSIS — D631 Anemia in chronic kidney disease: Secondary | ICD-10-CM | POA: Diagnosis not present

## 2017-03-03 DIAGNOSIS — N186 End stage renal disease: Secondary | ICD-10-CM | POA: Diagnosis not present

## 2017-03-03 DIAGNOSIS — D631 Anemia in chronic kidney disease: Secondary | ICD-10-CM | POA: Diagnosis not present

## 2017-03-03 DIAGNOSIS — E1029 Type 1 diabetes mellitus with other diabetic kidney complication: Secondary | ICD-10-CM | POA: Diagnosis not present

## 2017-03-03 DIAGNOSIS — N2581 Secondary hyperparathyroidism of renal origin: Secondary | ICD-10-CM | POA: Diagnosis not present

## 2017-03-05 DIAGNOSIS — D631 Anemia in chronic kidney disease: Secondary | ICD-10-CM | POA: Diagnosis not present

## 2017-03-05 DIAGNOSIS — N2581 Secondary hyperparathyroidism of renal origin: Secondary | ICD-10-CM | POA: Diagnosis not present

## 2017-03-05 DIAGNOSIS — N186 End stage renal disease: Secondary | ICD-10-CM | POA: Diagnosis not present

## 2017-03-05 DIAGNOSIS — E1029 Type 1 diabetes mellitus with other diabetic kidney complication: Secondary | ICD-10-CM | POA: Diagnosis not present

## 2017-03-07 DIAGNOSIS — E1029 Type 1 diabetes mellitus with other diabetic kidney complication: Secondary | ICD-10-CM | POA: Diagnosis not present

## 2017-03-07 DIAGNOSIS — N186 End stage renal disease: Secondary | ICD-10-CM | POA: Diagnosis not present

## 2017-03-07 DIAGNOSIS — D631 Anemia in chronic kidney disease: Secondary | ICD-10-CM | POA: Diagnosis not present

## 2017-03-07 DIAGNOSIS — N2581 Secondary hyperparathyroidism of renal origin: Secondary | ICD-10-CM | POA: Diagnosis not present

## 2017-03-10 DIAGNOSIS — N186 End stage renal disease: Secondary | ICD-10-CM | POA: Diagnosis not present

## 2017-03-10 DIAGNOSIS — D631 Anemia in chronic kidney disease: Secondary | ICD-10-CM | POA: Diagnosis not present

## 2017-03-10 DIAGNOSIS — N2581 Secondary hyperparathyroidism of renal origin: Secondary | ICD-10-CM | POA: Diagnosis not present

## 2017-03-10 DIAGNOSIS — E1029 Type 1 diabetes mellitus with other diabetic kidney complication: Secondary | ICD-10-CM | POA: Diagnosis not present

## 2017-03-12 DIAGNOSIS — D631 Anemia in chronic kidney disease: Secondary | ICD-10-CM | POA: Diagnosis not present

## 2017-03-12 DIAGNOSIS — N186 End stage renal disease: Secondary | ICD-10-CM | POA: Diagnosis not present

## 2017-03-12 DIAGNOSIS — E1029 Type 1 diabetes mellitus with other diabetic kidney complication: Secondary | ICD-10-CM | POA: Diagnosis not present

## 2017-03-12 DIAGNOSIS — N2581 Secondary hyperparathyroidism of renal origin: Secondary | ICD-10-CM | POA: Diagnosis not present

## 2017-03-17 DIAGNOSIS — N186 End stage renal disease: Secondary | ICD-10-CM | POA: Diagnosis not present

## 2017-03-17 DIAGNOSIS — D631 Anemia in chronic kidney disease: Secondary | ICD-10-CM | POA: Diagnosis not present

## 2017-03-17 DIAGNOSIS — N2581 Secondary hyperparathyroidism of renal origin: Secondary | ICD-10-CM | POA: Diagnosis not present

## 2017-03-17 DIAGNOSIS — E1029 Type 1 diabetes mellitus with other diabetic kidney complication: Secondary | ICD-10-CM | POA: Diagnosis not present

## 2017-03-19 DIAGNOSIS — D631 Anemia in chronic kidney disease: Secondary | ICD-10-CM | POA: Diagnosis not present

## 2017-03-19 DIAGNOSIS — E1029 Type 1 diabetes mellitus with other diabetic kidney complication: Secondary | ICD-10-CM | POA: Diagnosis not present

## 2017-03-19 DIAGNOSIS — N2581 Secondary hyperparathyroidism of renal origin: Secondary | ICD-10-CM | POA: Diagnosis not present

## 2017-03-19 DIAGNOSIS — I4891 Unspecified atrial fibrillation: Secondary | ICD-10-CM | POA: Diagnosis not present

## 2017-03-19 DIAGNOSIS — N186 End stage renal disease: Secondary | ICD-10-CM | POA: Diagnosis not present

## 2017-03-21 DIAGNOSIS — D631 Anemia in chronic kidney disease: Secondary | ICD-10-CM | POA: Diagnosis not present

## 2017-03-21 DIAGNOSIS — N186 End stage renal disease: Secondary | ICD-10-CM | POA: Diagnosis not present

## 2017-03-21 DIAGNOSIS — N2581 Secondary hyperparathyroidism of renal origin: Secondary | ICD-10-CM | POA: Diagnosis not present

## 2017-03-21 DIAGNOSIS — E1029 Type 1 diabetes mellitus with other diabetic kidney complication: Secondary | ICD-10-CM | POA: Diagnosis not present

## 2017-03-24 DIAGNOSIS — N2581 Secondary hyperparathyroidism of renal origin: Secondary | ICD-10-CM | POA: Diagnosis not present

## 2017-03-24 DIAGNOSIS — E1029 Type 1 diabetes mellitus with other diabetic kidney complication: Secondary | ICD-10-CM | POA: Diagnosis not present

## 2017-03-24 DIAGNOSIS — D631 Anemia in chronic kidney disease: Secondary | ICD-10-CM | POA: Diagnosis not present

## 2017-03-24 DIAGNOSIS — N186 End stage renal disease: Secondary | ICD-10-CM | POA: Diagnosis not present

## 2017-03-25 ENCOUNTER — Encounter: Payer: Self-pay | Admitting: Vascular Surgery

## 2017-03-26 DIAGNOSIS — E1029 Type 1 diabetes mellitus with other diabetic kidney complication: Secondary | ICD-10-CM | POA: Diagnosis not present

## 2017-03-26 DIAGNOSIS — N2581 Secondary hyperparathyroidism of renal origin: Secondary | ICD-10-CM | POA: Diagnosis not present

## 2017-03-26 DIAGNOSIS — D631 Anemia in chronic kidney disease: Secondary | ICD-10-CM | POA: Diagnosis not present

## 2017-03-26 DIAGNOSIS — N186 End stage renal disease: Secondary | ICD-10-CM | POA: Diagnosis not present

## 2017-03-27 DIAGNOSIS — Z992 Dependence on renal dialysis: Secondary | ICD-10-CM | POA: Diagnosis not present

## 2017-03-27 DIAGNOSIS — T8610 Unspecified complication of kidney transplant: Secondary | ICD-10-CM | POA: Diagnosis not present

## 2017-03-27 DIAGNOSIS — N186 End stage renal disease: Secondary | ICD-10-CM | POA: Diagnosis not present

## 2017-03-28 DIAGNOSIS — D631 Anemia in chronic kidney disease: Secondary | ICD-10-CM | POA: Diagnosis not present

## 2017-03-28 DIAGNOSIS — Z23 Encounter for immunization: Secondary | ICD-10-CM | POA: Diagnosis not present

## 2017-03-28 DIAGNOSIS — N2581 Secondary hyperparathyroidism of renal origin: Secondary | ICD-10-CM | POA: Diagnosis not present

## 2017-03-28 DIAGNOSIS — N186 End stage renal disease: Secondary | ICD-10-CM | POA: Diagnosis not present

## 2017-03-28 DIAGNOSIS — E1029 Type 1 diabetes mellitus with other diabetic kidney complication: Secondary | ICD-10-CM | POA: Diagnosis not present

## 2017-03-31 DIAGNOSIS — N186 End stage renal disease: Secondary | ICD-10-CM | POA: Diagnosis not present

## 2017-03-31 DIAGNOSIS — D631 Anemia in chronic kidney disease: Secondary | ICD-10-CM | POA: Diagnosis not present

## 2017-03-31 DIAGNOSIS — E1029 Type 1 diabetes mellitus with other diabetic kidney complication: Secondary | ICD-10-CM | POA: Diagnosis not present

## 2017-03-31 DIAGNOSIS — N2581 Secondary hyperparathyroidism of renal origin: Secondary | ICD-10-CM | POA: Diagnosis not present

## 2017-03-31 DIAGNOSIS — Z23 Encounter for immunization: Secondary | ICD-10-CM | POA: Diagnosis not present

## 2017-04-02 DIAGNOSIS — D631 Anemia in chronic kidney disease: Secondary | ICD-10-CM | POA: Diagnosis not present

## 2017-04-02 DIAGNOSIS — E1029 Type 1 diabetes mellitus with other diabetic kidney complication: Secondary | ICD-10-CM | POA: Diagnosis not present

## 2017-04-02 DIAGNOSIS — N186 End stage renal disease: Secondary | ICD-10-CM | POA: Diagnosis not present

## 2017-04-02 DIAGNOSIS — Z23 Encounter for immunization: Secondary | ICD-10-CM | POA: Diagnosis not present

## 2017-04-02 DIAGNOSIS — N2581 Secondary hyperparathyroidism of renal origin: Secondary | ICD-10-CM | POA: Diagnosis not present

## 2017-04-03 ENCOUNTER — Ambulatory Visit (HOSPITAL_COMMUNITY)
Admission: RE | Admit: 2017-04-03 | Discharge: 2017-04-03 | Disposition: A | Discharge status: Home / Self Care | Payer: Medicare Other | Source: Ambulatory Visit | Attending: Vascular Surgery | Admitting: Vascular Surgery

## 2017-04-03 ENCOUNTER — Encounter: Payer: Self-pay | Admitting: Vascular Surgery

## 2017-04-03 ENCOUNTER — Ambulatory Visit (INDEPENDENT_AMBULATORY_CARE_PROVIDER_SITE_OTHER): Payer: Medicare Other | Admitting: Vascular Surgery

## 2017-04-03 DIAGNOSIS — I70213 Atherosclerosis of native arteries of extremities with intermittent claudication, bilateral legs: Secondary | ICD-10-CM

## 2017-04-03 DIAGNOSIS — R931 Abnormal findings on diagnostic imaging of heart and coronary circulation: Secondary | ICD-10-CM | POA: Diagnosis not present

## 2017-04-03 DIAGNOSIS — I7092 Chronic total occlusion of artery of the extremities: Secondary | ICD-10-CM

## 2017-04-03 DIAGNOSIS — I70219 Atherosclerosis of native arteries of extremities with intermittent claudication, unspecified extremity: Secondary | ICD-10-CM | POA: Insufficient documentation

## 2017-04-03 NOTE — Progress Notes (Signed)
Requested by:  Nolene Ebbs, MD [address] [place], [place] [address]  Reason for consultation: short distance claudication   History of Present Illness   Patrick [name] is a 61 y.o. (1956-02-17) male who presents with chief complaint: bilateral calf pain.  Onset of symptom occurred months ago without obvious trigger of onset.  Pain is described as cramping in calves (L>R) with short distance ambulation, severity 5-10/10, and associated with ambulation.  Patient has attempted to treat this pain with rest.  The patient has no rest pain symptoms also and no leg wounds/ulcers.  Patient is unable to compete task such as shopping in the grocery store with having to stop walking.  Atherosclerotic risk factors include: HTN, DM and prior smoking.  Past Medical History:  Diagnosis Date  . Arthritis    "back" (09/24/2016)  . ESRD (end stage renal disease) on dialysis [facility])    "TTS; [place]" (09/24/2016)  . Gout   . History of blood transfusion 2009- 2016   "I've had several; low HgB"  . History of kidney stones    treated with nephrectomy  . Hypertension   . Impotence of organic origin   . Migraine    "stopped in my 30's; they were related to high BP" (09/24/2016)  . Myocardial infarction [facility]) '96  . Pneumonia 01/2015   " a touch & I was in the hosp."  . Secondary hyperparathyroidism ([place])   . Type 2 diabetes mellitus ([place])     Past Surgical History:  Procedure Laterality Date  . A-FLUTTER ABLATION N/A 09/24/2016   Procedure: A-Flutter Ablation;  Surgeon: Will Meredith Leeds, MD;  Location: [place] [facility];  Service: Cardiovascular;  Laterality: N/A;  . APPENDECTOMY    . AV FISTULA PLACEMENT Right 09/18/2014   Procedure: INSERTION OF ARTERIOVENOUS (AV) GORE-TEX GRAFT ARM USING 4-7MM  X 45CM STRETCH GORE-TEX VASCULAR GRAFT;  Surgeon: Rosetta Posner, MD;  Location: [place];  Service: Vascular;  Laterality: Right;  . AV FISTULA PLACEMENT Left 07/07/2016   Procedure: INSERTION OF  LEFT BRACHIAL TO AXILLARY ARTERIOVENOUS (AV) GORE-TEX ARM GRAFT;  Surgeon: Conrad [place], MD;  Location: [place];  Service: Vascular;  Laterality: Left;  . CARDIAC CATHETERIZATION  ~ 2016  . 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 HERNIA REPAIR  2017   with nephrectomy  . IR GENERIC HISTORICAL  05/11/2016   IR FLUORO GUIDE CV LINE LEFT 05/11/2016 Corrie Mckusick, DO [facility]  . IR GENERIC HISTORICAL  05/11/2016   IR US GUIDE VASC ACCESS LEFT 05/11/2016 Corrie Mckusick, DO [facility]  . IR GENERIC HISTORICAL  05/11/2016   IR US GUIDE VASC ACCESS RIGHT 05/11/2016 Corrie Mckusick, DO [facility]  . IR GENERIC HISTORICAL  05/11/2016   IR RADIOLOGY PERIPHERAL GUIDED IV START 05/11/2016 Corrie Mckusick, DO [facility]  . KIDNEY TRANSPLANT  2009  . NEPHRECTOMY  2017   transplant rejected   . PERIPHERAL VASCULAR CATHETERIZATION N/A 06/04/2016   Procedure: Upper Extremity Venography;  Surgeon: Waynetta Sandy, MD;  Location: [place] [facility];  Service: Cardiovascular;  Laterality: N/A;    Social History   Social History  . Marital status: Married    Spouse name: N/A  . Number of children: N/A  . Years of education: N/A   Occupational History  . Not on file.   Social History Main Topics  . Smoking status: Former Smoker  Types: Cigarettes    Quit date: 07/04/2014  . Smokeless tobacco: Never Used     Comment: "smoked ~ 1 pack/month when I did smoke; never a steady smoker"  . Alcohol use 0.0 oz/week     Comment: 09/24/2016 "2 drinks, 1-2 times/year"  . Drug use: No  . Sexual activity: Not Currently    Birth control/ protection: None   Other Topics Concern  . Not on file   Social History Narrative  . No narrative on file    Family History  Problem Relation Age of Onset  . Hyperlipidemia Mother   . Hypertension Mother     Current Outpatient Prescriptions  Medication Sig Dispense Refill  .  acetaminophen (TYLENOL) 500 MG tablet Take 1,000 mg by mouth every 6 (six) hours as needed for moderate pain or headache.    . albuterol (PROVENTIL HFA;VENTOLIN HFA) 108 (90 Base) MCG/ACT inhaler Inhale 1-2 puffs into the lungs every 6 (six) hours as needed for wheezing or shortness of breath.    [place] Kitchen aspirin EC 81 MG tablet Take 81 mg by mouth daily.    . calcium acetate (PHOSLO) 667 MG capsule Take 2,001 mg by mouth daily.    . calcium carbonate (OS-CAL - DOSED IN MG OF ELEMENTAL CALCIUM) 1250 (500 Ca) MG tablet Take 1 tablet by mouth once a week.     . colchicine 0.6 MG tablet Take 1 tablet (0.6 mg total) by mouth every 14 (fourteen) days. (Patient taking differently: Take 0.6 mg by mouth as needed. )    . isosorbide mononitrate (IMDUR) 30 MG 24 hr tablet Take 1 tablet (30 mg total) by mouth daily. 30 tablet 0   No current facility-administered medications for this visit.     Allergies  Allergen Reactions  . Lipitor [Atorvastatin]     Leg pain  . Metoprolol     Headaches   . Iodinated Diagnostic Agents Rash    Other Reaction: burning to mouth, swelling of lips.    REVIEW OF SYSTEMS (negative unless checked):   Cardiac:  []  Chest pain or chest pressure? [x]  Shortness of breath upon activity? []  Shortness of breath when lying flat? []  Irregular heart rhythm?  Vascular:  [x]  Pain in calf, thigh, or hip brought on by walking? []  Pain in feet at night that wakes you up from your sleep? []  Blood clot in your veins? []  Leg swelling?  Pulmonary:  []  Oxygen at home? []  Productive cough? []  Wheezing?  Neurologic:  []  Sudden weakness in arms or legs? []  Sudden numbness in arms or legs? []  Sudden onset of difficult speaking or slurred speech? []  Temporary loss of vision in one eye? []  Problems with dizziness?  Gastrointestinal:  []  Blood in stool? []  Vomited blood?  Genitourinary:  []  Burning when urinating? []  Blood in urine? [x]  end stage renal disease-HD:  T/R/S  Psychiatric:  []  Major depression  Hematologic:  []  Bleeding problems? []  Problems with blood clotting?  Dermatologic:  []  Rashes or ulcers?  Constitutional:  []  Fever or chills?  Ear/Nose/Throat:  []  Change in hearing? []  Nose bleeds? []  Sore throat?  Musculoskeletal:  []  Back pain? []  Joint pain? []  Muscle pain?   For VQI Use Only   PRE-ADM LIVING Home  AMB STATUS Ambulatory  CAD Sx None  PRIOR CHF None  STRESS TEST No    Physical Examination     Vitals:   04/03/17 1127  BP: 101/63  Pulse: 85  SpO2: 98%  Weight: 224 lb  1.6 oz (101.7 kg)  Height: 5' 6.5" (1.689 m)   Body mass index is 35.63 kg/m.  General Alert, O x 3, Obese, NAD  Head [place]/AT,  somewhat moon facies  Ear/Nose/ Throat Hearing grossly intact, nares without erythema or drainage, oropharynx without Erythema or Exudate, Mallampati score: 3,   Eyes PERRLA, EOMI,    Neck Supple, mid-line trachea,    Pulmonary Sym exp, good B air movt, CTA B  Cardiac RRR, Nl S1, S2, no Murmurs, No rubs, No S3,S4  Vascular Vessel Right Left  Radial Palpable Palpable  Brachial Palpable Palpable  Carotid Palpable, No Bruit Palpable, No Bruit  Aorta Not palpable N/A  Femoral Palpable Palpable  Popliteal Not palpable Not palpable  PT Not palpable Not palpable  DP Not palpable Not palpable    Gastro- intestinal soft, non-distended, non-tender to palpation, No guarding or rebound, no HSM, no masses, no CVAT B, No palpable prominent aortic pulse, incision hernia most prominent RLQ    Musculo- skeletal M/S 5/5 throughout  , Extremities without ischemic changes  , No edema present, No visible varicosities , No Lipodermatosclerosis present  Neurologic Cranial nerves 2-12 intact , Pain and light touch intact in extremities , Motor exam as listed above  Psychiatric Judgement intact, Mood & affect appropriate for pt's clinical situation  Dermatologic See M/S exam for extremity exam, No rashes otherwise noted   Lymphatic  Palpable lymph nodes: None    Non-Invasive Vascular imaging   BLE ABI (04/03/2017 )  R:   ABI: [place],   PT: [place]  DP: mono  TBI:  0  L:   ABI: [place],   PT: mono  DP: mono  TBI: 0   Outside Studies/Documentation   4 pages of outside documents were reviewed including: outpatient PCP chart.    Medical Decision Making   Eeshan Verbrugge is a 61 y.o. 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 I discussed with the patient the nature of angiographic procedures, especially the limited patencies of any endovascular intervention.   The patient is aware of that the risks of an angiographic procedure include but are not limited to: bleeding, infection, access site complications, renal failure, embolization, rupture of vessel, dissection, arteriovenous fistula, possible need for emergent surgical intervention, possible need for surgical procedures to treat the patient's pathology, anaphylactic reaction to contrast, and stroke and death.   The patient is aware of the risks and agrees to proceed.  I discussed with the patient the natural history of intermittent claudication: 75% of patients have stable or improved symptoms in a year an only 2% require amputation. Eventually 20% may require intervention in a year.  I discussed in depth with the patient the nature of atherosclerosis, and emphasized the importance of maximal medical management including strict control of blood pressure, blood glucose, and lipid levels, antiplatelet agent, obtaining regular exercise, and cessation of smoking.    The patient is aware that without maximal medical management the underlying atherosclerotic disease process will progress, limiting the benefit of any interventions.  I  discussed in depth with the patient a walking plan and how to execute such. The patient is currently not on a statin due to reported allergy.  The patient is currently on an anti-platelet: ASA.  Thank you for allowing [place] to participate in this patient's care.  Adele Barthel, MD, FACS [facility] Office: [phone] Pager: [phone]  04/03/2017, 12:36 PM

## 2017-04-04 DIAGNOSIS — E1029 Type 1 diabetes mellitus with other diabetic kidney complication: Secondary | ICD-10-CM | POA: Diagnosis not present

## 2017-04-04 DIAGNOSIS — N186 End stage renal disease: Secondary | ICD-10-CM | POA: Diagnosis not present

## 2017-04-04 DIAGNOSIS — N2581 Secondary hyperparathyroidism of renal origin: Secondary | ICD-10-CM | POA: Diagnosis not present

## 2017-04-04 DIAGNOSIS — D631 Anemia in chronic kidney disease: Secondary | ICD-10-CM | POA: Diagnosis not present

## 2017-04-04 DIAGNOSIS — Z23 Encounter for immunization: Secondary | ICD-10-CM | POA: Diagnosis not present

## 2017-04-06 ENCOUNTER — Other Ambulatory Visit: Payer: Self-pay

## 2017-04-07 DIAGNOSIS — N186 End stage renal disease: Secondary | ICD-10-CM | POA: Diagnosis not present

## 2017-04-07 DIAGNOSIS — Z23 Encounter for immunization: Secondary | ICD-10-CM | POA: Diagnosis not present

## 2017-04-07 DIAGNOSIS — N2581 Secondary hyperparathyroidism of renal origin: Secondary | ICD-10-CM | POA: Diagnosis not present

## 2017-04-07 DIAGNOSIS — E1029 Type 1 diabetes mellitus with other diabetic kidney complication: Secondary | ICD-10-CM | POA: Diagnosis not present

## 2017-04-07 DIAGNOSIS — D631 Anemia in chronic kidney disease: Secondary | ICD-10-CM | POA: Diagnosis not present

## 2017-04-11 DIAGNOSIS — D631 Anemia in chronic kidney disease: Secondary | ICD-10-CM | POA: Diagnosis not present

## 2017-04-11 DIAGNOSIS — E1029 Type 1 diabetes mellitus with other diabetic kidney complication: Secondary | ICD-10-CM | POA: Diagnosis not present

## 2017-04-11 DIAGNOSIS — N186 End stage renal disease: Secondary | ICD-10-CM | POA: Diagnosis not present

## 2017-04-11 DIAGNOSIS — N2581 Secondary hyperparathyroidism of renal origin: Secondary | ICD-10-CM | POA: Diagnosis not present

## 2017-04-11 DIAGNOSIS — Z23 Encounter for immunization: Secondary | ICD-10-CM | POA: Diagnosis not present

## 2017-04-13 ENCOUNTER — Ambulatory Visit (HOSPITAL_COMMUNITY)
Admission: RE | Admit: 2017-04-13 | Discharge: 2017-04-13 | Disposition: A | Discharge status: Home / Self Care | Payer: Medicare Other | Source: Ambulatory Visit | Attending: Vascular Surgery | Admitting: Vascular Surgery

## 2017-04-13 ENCOUNTER — Encounter (HOSPITAL_COMMUNITY)
Admission: RE | Disposition: A | Discharge status: Home / Self Care | Payer: Self-pay | Source: Ambulatory Visit | Attending: Vascular Surgery

## 2017-04-13 ENCOUNTER — Telehealth: Payer: Self-pay | Admitting: Vascular Surgery

## 2017-04-13 DIAGNOSIS — Z940 Kidney transplant status: Secondary | ICD-10-CM | POA: Insufficient documentation

## 2017-04-13 DIAGNOSIS — M1990 Unspecified osteoarthritis, unspecified site: Secondary | ICD-10-CM | POA: Insufficient documentation

## 2017-04-13 DIAGNOSIS — M109 Gout, unspecified: Secondary | ICD-10-CM | POA: Insufficient documentation

## 2017-04-13 DIAGNOSIS — I120 Hypertensive chronic kidney disease with stage 5 chronic kidney disease or end stage renal disease: Secondary | ICD-10-CM | POA: Diagnosis not present

## 2017-04-13 DIAGNOSIS — G43909 Migraine, unspecified, not intractable, without status migrainosus: Secondary | ICD-10-CM | POA: Diagnosis not present

## 2017-04-13 DIAGNOSIS — Z87891 Personal history of nicotine dependence: Secondary | ICD-10-CM | POA: Diagnosis not present

## 2017-04-13 DIAGNOSIS — E1151 Type 2 diabetes mellitus with diabetic peripheral angiopathy without gangrene: Secondary | ICD-10-CM | POA: Insufficient documentation

## 2017-04-13 DIAGNOSIS — Z992 Dependence on renal dialysis: Secondary | ICD-10-CM | POA: Insufficient documentation

## 2017-04-13 DIAGNOSIS — I252 Old myocardial infarction: Secondary | ICD-10-CM | POA: Insufficient documentation

## 2017-04-13 DIAGNOSIS — Z7982 Long term (current) use of aspirin: Secondary | ICD-10-CM | POA: Insufficient documentation

## 2017-04-13 DIAGNOSIS — I70213 Atherosclerosis of native arteries of extremities with intermittent claudication, bilateral legs: Secondary | ICD-10-CM | POA: Diagnosis not present

## 2017-04-13 DIAGNOSIS — N186 End stage renal disease: Secondary | ICD-10-CM | POA: Diagnosis not present

## 2017-04-13 DIAGNOSIS — E1122 Type 2 diabetes mellitus with diabetic chronic kidney disease: Secondary | ICD-10-CM | POA: Insufficient documentation

## 2017-04-13 DIAGNOSIS — N2581 Secondary hyperparathyroidism of renal origin: Secondary | ICD-10-CM | POA: Diagnosis not present

## 2017-04-13 DIAGNOSIS — Z951 Presence of aortocoronary bypass graft: Secondary | ICD-10-CM | POA: Diagnosis not present

## 2017-04-13 DIAGNOSIS — I70219 Atherosclerosis of native arteries of extremities with intermittent claudication, unspecified extremity: Secondary | ICD-10-CM | POA: Diagnosis present

## 2017-04-13 HISTORY — PX: PERIPHERAL VASCULAR INTERVENTION: CATH118257

## 2017-04-13 HISTORY — PX: ABDOMINAL AORTOGRAM W/LOWER EXTREMITY: CATH118223

## 2017-04-13 LAB — POCT I-STAT, CHEM 8
BUN: 61 mg/dL — AB (ref 6–20)
CALCIUM ION: 1.02 mmol/L — AB (ref 1.15–1.40)
CHLORIDE: 101 mmol/L (ref 101–111)
CREATININE: 16.7 mg/dL — AB (ref 0.61–1.24)
GLUCOSE: 117 mg/dL — AB (ref 65–99)
HCT: 30 % — ABNORMAL LOW (ref 39.0–52.0)
Hemoglobin: 10.2 g/dL — ABNORMAL LOW (ref 13.0–17.0)
Potassium: 4.3 mmol/L (ref 3.5–5.1)
Sodium: 137 mmol/L (ref 135–145)
TCO2: 25 mmol/L (ref 22–32)

## 2017-04-13 LAB — POCT ACTIVATED CLOTTING TIME
ACTIVATED CLOTTING TIME: 241 s
ACTIVATED CLOTTING TIME: 268 s
ACTIVATED CLOTTING TIME: 202 s
Activated Clotting Time: 180 seconds
Activated Clotting Time: 191 seconds

## 2017-04-13 LAB — GLUCOSE, CAPILLARY: GLUCOSE-CAPILLARY: 125 mg/dL — AB (ref 65–99)

## 2017-04-13 SURGERY — ABDOMINAL AORTOGRAM W/LOWER EXTREMITY
Anesthesia: LOCAL

## 2017-04-13 MED ORDER — MIDAZOLAM HCL 2 MG/2ML IJ SOLN
INTRAMUSCULAR | Status: DC | PRN
  Administered 2017-04-13: 0.5 mg via INTRAVENOUS
  Administered 2017-04-13: 1 mg via INTRAVENOUS

## 2017-04-13 MED ORDER — SODIUM CHLORIDE 0.9% FLUSH: 3.0000 mL | INTRAVENOUS | Status: DC | PRN

## 2017-04-13 MED ORDER — FAMOTIDINE IN NACL 20-0.9 MG/50ML-% IV SOLN
20.0000 mg | INTRAVENOUS | Status: AC
  Administered 2017-04-13: 20 mg via INTRAVENOUS

## 2017-04-13 MED ORDER — MIDAZOLAM HCL 2 MG/2ML IJ SOLN
INTRAMUSCULAR | Status: AC
  Filled 2017-04-13: qty 2

## 2017-04-13 MED ORDER — DIPHENHYDRAMINE HCL 50 MG/ML IJ SOLN
INTRAMUSCULAR | Status: AC
  Administered 2017-04-13: 25 mg via INTRAVENOUS
  Filled 2017-04-13: qty 1

## 2017-04-13 MED ORDER — METHYLPREDNISOLONE SODIUM SUCC 125 MG IJ SOLR
125.0000 mg | INTRAMUSCULAR | Status: AC
  Administered 2017-04-13: 125 mg via INTRAVENOUS

## 2017-04-13 MED ORDER — DIPHENHYDRAMINE HCL 50 MG/ML IJ SOLN
25.0000 mg | INTRAMUSCULAR | Status: AC
Start: 2017-04-13 — End: 2017-04-13
  Administered 2017-04-13: 25 mg via INTRAVENOUS

## 2017-04-13 MED ORDER — HEPARIN SODIUM (PORCINE) 1000 UNIT/ML IJ SOLN
INTRAMUSCULAR | Status: AC
  Filled 2017-04-13: qty 2

## 2017-04-13 MED ORDER — LIDOCAINE HCL (PF) 1 % IJ SOLN
INTRAMUSCULAR | Status: AC
  Filled 2017-04-13: qty 30

## 2017-04-13 MED ORDER — HYDRALAZINE HCL 20 MG/ML IJ SOLN: 5.0000 mg | INTRAMUSCULAR | Status: DC | PRN

## 2017-04-13 MED ORDER — METHYLPREDNISOLONE SODIUM SUCC 125 MG IJ SOLR
INTRAMUSCULAR | Status: AC
  Administered 2017-04-13: 125 mg via INTRAVENOUS
  Filled 2017-04-13: qty 2

## 2017-04-13 MED ORDER — SODIUM CHLORIDE 0.9% FLUSH: 3.0000 mL | Freq: Two times a day (BID) | INTRAVENOUS | Status: DC

## 2017-04-13 MED ORDER — SODIUM CHLORIDE 0.9 % IV SOLN: INTRAVENOUS | Status: DC

## 2017-04-13 MED ORDER — FENTANYL CITRATE (PF) 100 MCG/2ML IJ SOLN
INTRAMUSCULAR | Status: DC | PRN
  Administered 2017-04-13 (×2): 50 ug via INTRAVENOUS

## 2017-04-13 MED ORDER — LIDOCAINE HCL (PF) 1 % IJ SOLN
INTRAMUSCULAR | Status: DC | PRN
  Administered 2017-04-13: 18 mL

## 2017-04-13 MED ORDER — FENTANYL CITRATE (PF) 100 MCG/2ML IJ SOLN
INTRAMUSCULAR | Status: AC
  Filled 2017-04-13: qty 2

## 2017-04-13 MED ORDER — SODIUM CHLORIDE 0.9 % IV SOLN: 250.0000 mL | INTRAVENOUS | Status: DC | PRN

## 2017-04-13 MED ORDER — FAMOTIDINE IN NACL 20-0.9 MG/50ML-% IV SOLN
INTRAVENOUS | Status: AC
  Administered 2017-04-13: 20 mg via INTRAVENOUS
  Filled 2017-04-13: qty 50

## 2017-04-13 MED ORDER — HEPARIN SODIUM (PORCINE) 1000 UNIT/ML IJ SOLN
INTRAMUSCULAR | Status: DC | PRN
  Administered 2017-04-13: 11000 [IU] via INTRAVENOUS

## 2017-04-13 MED ORDER — CLOPIDOGREL BISULFATE 75 MG PO TABS: 75.0000 mg | ORAL_TABLET | Freq: Every day | ORAL | 11 refills | Status: DC

## 2017-04-13 MED ORDER — HEPARIN (PORCINE) IN NACL 2-0.9 UNIT/ML-% IJ SOLN
INTRAMUSCULAR | Status: AC | PRN
  Administered 2017-04-13: 1000 mL

## 2017-04-13 MED ORDER — HEPARIN (PORCINE) IN NACL 2-0.9 UNIT/ML-% IJ SOLN
INTRAMUSCULAR | Status: AC
  Filled 2017-04-13: qty 1000

## 2017-04-13 SURGICAL SUPPLY — 21 items
BALLN ARMADA 12X20X80 (BALLOONS) ×3
BALLOON ARMADA 12X20X80 (BALLOONS) ×2 IMPLANT
CATH OMNI FLUSH 5F 65CM (CATHETERS) ×3 IMPLANT
CATH STRAIGHT 5FR 65CM (CATHETERS) ×3 IMPLANT
COVER PRB 48X5XTLSCP FOLD TPE (BAG) ×2 IMPLANT
COVER PROBE 5X48 (BAG) ×1
DEVICE CONTINUOUS FLUSH (MISCELLANEOUS) ×3 IMPLANT
DEVICE TORQUE .025-.038 (MISCELLANEOUS) ×3 IMPLANT
GUIDEWIRE ANGLED .035X150CM (WIRE) ×3 IMPLANT
KIT ENCORE 26 ADVANTAGE (KITS) ×3 IMPLANT
KIT MICROINTRODUCER STIFF 5F (SHEATH) ×3 IMPLANT
KIT PV (KITS) ×3 IMPLANT
SHEATH PINNACLE 5F 10CM (SHEATH) ×3 IMPLANT
SHEATH PINNACLE MP 7F 45CM (SHEATH) ×3 IMPLANT
STENT OMNILINK ELITE 10X29X80 (Permanent Stent) ×3 IMPLANT
SYR MEDRAD MARK V 150ML (SYRINGE) ×3 IMPLANT
TRANSDUCER W/STOPCOCK (MISCELLANEOUS) ×3 IMPLANT
TRAY PV CATH (CUSTOM PROCEDURE TRAY) ×3 IMPLANT
WIRE AMPLATZ SS-J .035X180CM (WIRE) ×3 IMPLANT
WIRE BENTSON .035X145CM (WIRE) ×3 IMPLANT
WIRE ROSEN-J .035X260CM (WIRE) ×3 IMPLANT

## 2017-04-13 NOTE — Telephone Encounter (Signed)
Sched appt 05/15/17 at 2:00. Lm on cell#.

## 2017-04-13 NOTE — Progress Notes (Signed)
Site area: rt groin fa sheath Site Prior to Removal:  Level 0. Site had been oozing, dressing saturated, no hematoma Pressure Applied For: 25 minutes Manual:   yes Patient Status During Pull:  stable Post Pull Site:  Level 0 Post Pull Instructions Given:  yes Post Pull Pulses Present: dopplered Dressing Applied:  Gauze and tegaderm Bedrest begins @ 1281 Comments:

## 2017-04-13 NOTE — Discharge Instructions (Signed)
Femoral Site Care °Refer to this sheet in the next few weeks. These instructions provide you with information about caring for yourself after your procedure. Your health care provider may also give you more specific instructions. Your treatment has been planned according to current medical practices, but problems sometimes occur. Call your health care provider if you have any problems or questions after your procedure. °What can I expect after the procedure? °After your procedure, it is typical to have the following: °· Bruising at the site that usually fades within 1-2 weeks. °· Blood collecting in the tissue (hematoma) that may be painful to the touch. It should usually decrease in size and tenderness within 1-2 weeks. ° °Follow these instructions at home: °· Take medicines only as directed by your health care provider. °· You may shower 24-48 hours after the procedure or as directed by your health care provider. Remove the bandage (dressing) and gently wash the site with plain soap and water. Pat the area dry with a clean towel. Do not rub the site, because this may cause bleeding. °· Do not take baths, swim, or use a hot tub until your health care provider approves. °· Check your insertion site every day for redness, swelling, or drainage. °· Do not apply powder or lotion to the site. °· Limit use of stairs to twice a day for the first 2-3 days or as directed by your health care provider. °· Do not squat for the first 2-3 days or as directed by your health care provider. °· Do not lift over 10 lb (4.5 kg) for 5 days after your procedure or as directed by your health care provider. °· Ask your health care provider when it is okay to: °? Return to work or school. °? Resume usual physical activities or sports. °? Resume sexual activity. °· Do not drive home if you are discharged the same day as the procedure. Have someone else drive you. °· You may drive 24 hours after the procedure unless otherwise instructed by  your health care provider. °· Do not operate machinery or power tools for 24 hours after the procedure or as directed by your health care provider. °· If your procedure was done as an outpatient procedure, which means that you went home the same day as your procedure, a responsible adult should be with you for the first 24 hours after you arrive home. °· Keep all follow-up visits as directed by your health care provider. This is important. °Contact a health care provider if: °· You have a fever. °· You have chills. °· You have increased bleeding from the site. Hold pressure on the site. °Get help right away if: °· You have unusual pain at the site. °· You have redness, warmth, or swelling at the site. °· You have drainage (other than a small amount of blood on the dressing) from the site. °· The site is bleeding, and the bleeding does not stop after 30 minutes of holding steady pressure on the site. °· Your leg or foot becomes pale, cool, tingly, or numb. °This information is not intended to replace advice given to you by your health care provider. Make sure you discuss any questions you have with your health care provider. °Document Released: 03/17/2014 Document Revised: 12/20/2015 Document Reviewed: 01/31/2014 °Elsevier Interactive Patient Education © 2018 [employer] °Moderate Conscious Sedation, Adult, Care After °These instructions provide you with information about caring for yourself after your procedure. Your health care provider may also give you more   specific instructions. Your treatment has been planned according to current medical practices, but problems sometimes occur. Call your health care provider if you have any problems or questions after your procedure. °What can I expect after the procedure? °After your procedure, it is common: °· To feel sleepy for several hours. °· To feel clumsy and have poor balance for several hours. °· To have poor judgment for several hours. °· To vomit if you eat too  soon. ° °Follow these instructions at home: °For at least 24 hours after the procedure: ° °· Do not: °? Participate in activities where you could fall or become injured. °? Drive. °? Use heavy machinery. °? Drink alcohol. °? Take sleeping pills or medicines that cause drowsiness. °? Make important decisions or sign legal documents. °? Take care of children on your own. °· Rest. °Eating and drinking °· Follow the diet recommended by your health care provider. °· If you vomit: °? Drink water, juice, or soup when you can drink without vomiting. °? Make sure you have little or no nausea before eating solid foods. °General instructions °· Have a responsible adult stay with you until you are awake and alert. °· Take over-the-counter and prescription medicines only as told by your health care provider. °· If you smoke, do not smoke without supervision. °· Keep all follow-up visits as told by your health care provider. This is important. °Contact a health care provider if: °· You keep feeling nauseous or you keep vomiting. °· You feel light-headed. °· You develop a rash. °· You have a fever. °Get help right away if: °· You have trouble breathing. °This information is not intended to replace advice given to you by your health care provider. Make sure you discuss any questions you have with your health care provider. °Document Released: 05/04/2013 Document Revised: 12/17/2015 Document Reviewed: 11/03/2015 °Elsevier Interactive Patient Education © 2018 Elsevier Inc[employer] ° °

## 2017-04-13 NOTE — Interval H&P Note (Signed)
History and Physical Interval Note:  04/13/2017 6:59 AM  Patrick [name]  has presented today for surgery, with the diagnosis of pvd  The various methods of treatment have been discussed with the patient and family. After consideration of risks, benefits and other options for treatment, the patient has consented to  Procedure(s): ABDOMINAL AORTOGRAM W/LOWER EXTREMITY (N/A) as a surgical intervention .  The patient's history has been reviewed, patient examined, no change in status, stable for surgery.  I have reviewed the patient's chart and labs.  Questions were answered to the patient's satisfaction.     Adele Barthel

## 2017-04-13 NOTE — Telephone Encounter (Signed)
-----   Message from Mena Goes, RN sent at 04/13/2017 10:45 AM EDT ----- Regarding: 4 weeks with Dr. Bridgett Larsson   ----- Message ----- From: Conrad Ridgecrest, MD Sent: 04/13/2017   9:19 AM To: [facility] Charge Star Cheese [phone] Dec 06, 1955  PROCEDURE: 1.  right common femoral artery cannulation under ultrasound guidance 2.  Placement of catheter in aorta 3.  Aortogram 4.  Angioplasty and stenting left common iliac artery and external iliac artery (10 mm x 29 mm) 5.  Second order arterial selection 6.  Left leg runoff via catheter 7.  Right leg runoff via sheath 8.  Conscious sedation for 67 minutes  Follow-up: 4 weeks

## 2017-04-13 NOTE — H&P (View-Only) (Signed)
Requested by:  Nolene Ebbs, MD [address] [place], [place] [address]  Reason for consultation: short distance claudication   History of Present Illness   Patrick [name] is a 61 y.o. (August 19, 1955) male who presents with chief complaint: bilateral calf pain.  Onset of symptom occurred months ago without obvious trigger of onset.  Pain is described as cramping in calves (L>R) with short distance ambulation, severity 5-10/10, and associated with ambulation.  Patient has attempted to treat this pain with rest.  The patient has no rest pain symptoms also and no leg wounds/ulcers.  Patient is unable to compete task such as shopping in the grocery store with having to stop walking.  Atherosclerotic risk factors include: HTN, DM and prior smoking.  Past Medical History:  Diagnosis Date  . Arthritis    "back" (09/24/2016)  . ESRD (end stage renal disease) on dialysis [facility])    "TTS; [place]" (09/24/2016)  . Gout   . History of blood transfusion 2009- 2016   "I've had several; low HgB"  . History of kidney stones    treated with nephrectomy  . Hypertension   . Impotence of organic origin   . Migraine    "stopped in my 30's; they were related to high BP" (09/24/2016)  . Myocardial infarction [facility]) '96  . Pneumonia 01/2015   " a touch & I was in the hosp."  . Secondary hyperparathyroidism ([place])   . Type 2 diabetes mellitus ([place])     Past Surgical History:  Procedure Laterality Date  . A-FLUTTER ABLATION N/A 09/24/2016   Procedure: A-Flutter Ablation;  Surgeon: Will Meredith Leeds, MD;  Location: [place] [facility];  Service: Cardiovascular;  Laterality: N/A;  . APPENDECTOMY    . AV FISTULA PLACEMENT Right 09/18/2014   Procedure: INSERTION OF ARTERIOVENOUS (AV) GORE-TEX GRAFT ARM USING 4-7MM  X 45CM STRETCH GORE-TEX VASCULAR GRAFT;  Surgeon: Rosetta Posner, MD;  Location: [place];  Service: Vascular;  Laterality: Right;  . AV FISTULA PLACEMENT Left 07/07/2016   Procedure: INSERTION OF  LEFT BRACHIAL TO AXILLARY ARTERIOVENOUS (AV) GORE-TEX ARM GRAFT;  Surgeon: Conrad Lockport, MD;  Location: [place];  Service: Vascular;  Laterality: Left;  . CARDIAC CATHETERIZATION  ~ 2016  . 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 HERNIA REPAIR  2017   with nephrectomy  . IR GENERIC HISTORICAL  05/11/2016   IR FLUORO GUIDE CV LINE LEFT 05/11/2016 Corrie Mckusick, DO [facility]  . IR GENERIC HISTORICAL  05/11/2016   IR US GUIDE VASC ACCESS LEFT 05/11/2016 Corrie Mckusick, DO [facility]  . IR GENERIC HISTORICAL  05/11/2016   IR US GUIDE VASC ACCESS RIGHT 05/11/2016 Corrie Mckusick, DO [facility]  . IR GENERIC HISTORICAL  05/11/2016   IR RADIOLOGY PERIPHERAL GUIDED IV START 05/11/2016 Corrie Mckusick, DO [facility]  . KIDNEY TRANSPLANT  2009  . NEPHRECTOMY  2017   transplant rejected   . PERIPHERAL VASCULAR CATHETERIZATION N/A 06/04/2016   Procedure: Upper Extremity Venography;  Surgeon: Waynetta Sandy, MD;  Location: [place] [facility];  Service: Cardiovascular;  Laterality: N/A;    Social History   Social History  . Marital status: Married    Spouse name: N/A  . Number of children: N/A  . Years of education: N/A   Occupational History  . Not on file.   Social History Main Topics  . Smoking status: Former Smoker  Types: Cigarettes    Quit date: 07/04/2014  . Smokeless tobacco: Never Used     Comment: "smoked ~ 1 pack/month when I did smoke; never a steady smoker"  . Alcohol use 0.0 oz/week     Comment: 09/24/2016 "2 drinks, 1-2 times/year"  . Drug use: No  . Sexual activity: Not Currently    Birth control/ protection: None   Other Topics Concern  . Not on file   Social History Narrative  . No narrative on file    Family History  Problem Relation Age of Onset  . Hyperlipidemia Mother   . Hypertension Mother     Current Outpatient Prescriptions  Medication Sig Dispense Refill  .  acetaminophen (TYLENOL) 500 MG tablet Take 1,000 mg by mouth every 6 (six) hours as needed for moderate pain or headache.    . albuterol (PROVENTIL HFA;VENTOLIN HFA) 108 (90 Base) MCG/ACT inhaler Inhale 1-2 puffs into the lungs every 6 (six) hours as needed for wheezing or shortness of breath.    Marland Kitchen aspirin EC 81 MG tablet Take 81 mg by mouth daily.    . calcium acetate (PHOSLO) 667 MG capsule Take 2,001 mg by mouth daily.    . calcium carbonate (OS-CAL - DOSED IN MG OF ELEMENTAL CALCIUM) 1250 (500 Ca) MG tablet Take 1 tablet by mouth once a week.     . colchicine 0.6 MG tablet Take 1 tablet (0.6 mg total) by mouth every 14 (fourteen) days. (Patient taking differently: Take 0.6 mg by mouth as needed. )    . isosorbide mononitrate (IMDUR) 30 MG 24 hr tablet Take 1 tablet (30 mg total) by mouth daily. 30 tablet 0   No current facility-administered medications for this visit.     Allergies  Allergen Reactions  . Lipitor [Atorvastatin]     Leg pain  . Metoprolol     Headaches   . Iodinated Diagnostic Agents Rash    Other Reaction: burning to mouth, swelling of lips.    REVIEW OF SYSTEMS (negative unless checked):   Cardiac:  []  Chest pain or chest pressure? [x]  Shortness of breath upon activity? []  Shortness of breath when lying flat? []  Irregular heart rhythm?  Vascular:  [x]  Pain in calf, thigh, or hip brought on by walking? []  Pain in feet at night that wakes you up from your sleep? []  Blood clot in your veins? []  Leg swelling?  Pulmonary:  []  Oxygen at home? []  Productive cough? []  Wheezing?  Neurologic:  []  Sudden weakness in arms or legs? []  Sudden numbness in arms or legs? []  Sudden onset of difficult speaking or slurred speech? []  Temporary loss of vision in one eye? []  Problems with dizziness?  Gastrointestinal:  []  Blood in stool? []  Vomited blood?  Genitourinary:  []  Burning when urinating? []  Blood in urine? [x]  end stage renal disease-HD:  T/R/S  Psychiatric:  []  Major depression  Hematologic:  []  Bleeding problems? []  Problems with blood clotting?  Dermatologic:  []  Rashes or ulcers?  Constitutional:  []  Fever or chills?  Ear/Nose/Throat:  []  Change in hearing? []  Nose bleeds? []  Sore throat?  Musculoskeletal:  []  Back pain? []  Joint pain? []  Muscle pain?   For VQI Use Only   PRE-ADM LIVING Home  AMB STATUS Ambulatory  CAD Sx None  PRIOR CHF None  STRESS TEST No    Physical Examination     Vitals:   04/03/17 1127  BP: 101/63  Pulse: 85  SpO2: 98%  Weight: 224 lb  1.6 oz (101.7 kg)  Height: 5' 6.5" (1.689 m)   Body mass index is 35.63 kg/m.  General Alert, O x 3, Obese, NAD  Head [place]/AT,  somewhat moon facies  Ear/Nose/ Throat Hearing grossly intact, nares without erythema or drainage, oropharynx without Erythema or Exudate, Mallampati score: 3,   Eyes PERRLA, EOMI,    Neck Supple, mid-line trachea,    Pulmonary Sym exp, good B air movt, CTA B  Cardiac RRR, Nl S1, S2, no Murmurs, No rubs, No S3,S4  Vascular Vessel Right Left  Radial Palpable Palpable  Brachial Palpable Palpable  Carotid Palpable, No Bruit Palpable, No Bruit  Aorta Not palpable N/A  Femoral Palpable Palpable  Popliteal Not palpable Not palpable  PT Not palpable Not palpable  DP Not palpable Not palpable    Gastro- intestinal soft, non-distended, non-tender to palpation, No guarding or rebound, no HSM, no masses, no CVAT B, No palpable prominent aortic pulse, incision hernia most prominent RLQ    Musculo- skeletal M/S 5/5 throughout  , Extremities without ischemic changes  , No edema present, No visible varicosities , No Lipodermatosclerosis present  Neurologic Cranial nerves 2-12 intact , Pain and light touch intact in extremities , Motor exam as listed above  Psychiatric Judgement intact, Mood & affect appropriate for pt's clinical situation  Dermatologic See M/S exam for extremity exam, No rashes otherwise noted   Lymphatic  Palpable lymph nodes: None    Non-Invasive Vascular imaging   BLE ABI (04/03/2017 )  R:   ABI: [place],   PT: [place]  DP: mono  TBI:  0  L:   ABI: ,   PT: mono  DP: mono  TBI: 0   Outside Studies/Documentation   4 pages of outside documents were reviewed including: outpatient PCP chart.    Medical Decision Making   Patrick [name] is a 61 y.o. 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 I discussed with the patient the nature of angiographic procedures, especially the limited patencies of any endovascular intervention.   The patient is aware of that the risks of an angiographic procedure include but are not limited to: bleeding, infection, access site complications, renal failure, embolization, rupture of vessel, dissection, arteriovenous fistula, possible need for emergent surgical intervention, possible need for surgical procedures to treat the patient's pathology, anaphylactic reaction to contrast, and stroke and death.   The patient is aware of the risks and agrees to proceed.  I discussed with the patient the natural history of intermittent claudication: 75% of patients have stable or improved symptoms in a year an only 2% require amputation. Eventually 20% may require intervention in a year.  I discussed in depth with the patient the nature of atherosclerosis, and emphasized the importance of maximal medical management including strict control of blood pressure, blood glucose, and lipid levels, antiplatelet agent, obtaining regular exercise, and cessation of smoking.    The patient is aware that without maximal medical management the underlying atherosclerotic disease process will progress, limiting the benefit of any interventions.  I  discussed in depth with the patient a walking plan and how to execute such. The patient is currently not on a statin due to reported allergy.  The patient is currently on an anti-platelet: ASA.  Thank you for allowing [place] to participate in this patient's care.  Adele Barthel, MD, FACS [facility] Office: [phone] Pager: [phone]  04/03/2017, 12:36 PM

## 2017-04-13 NOTE — Op Note (Signed)
OPERATIVE NOTE   PROCEDURE: 1.  right common femoral artery cannulation under ultrasound guidance 2.  Placement of catheter in aorta 3.  Aortogram 4.  Angioplasty and stenting left common iliac artery and external iliac artery (10 mm x 29 mm) 5.  Second order arterial selection 6.  Left leg runoff via catheter 7.  Right leg runoff via sheath 8.  Conscious sedation for 67 minutes  PRE-OPERATIVE DIAGNOSIS: bilateral lifestyle limiting claudication  POST-OPERATIVE DIAGNOSIS: same as above   SURGEON: Adele Barthel, MD  ANESTHESIA: conscious sedation  ESTIMATED BLOOD LOSS: 30 cc  CONTRAST: 180 cc  FINDING(S):  Aorta: patent  Superior mesenteric artery: patent Celiac artery: patent   Right Left  RA occluded occluded  CIA patent Patent proximally, >90% stenosis distally extending into proximal external iliac artery: resolved with stent placement   EIA patent patent  IIA patent Patent, 90% orificial stenosis  CFA patent patent  SFA Patent proximally, diseased throughout, 50-75% proximally, 90% in proximal 1/3, occluded distally Occluded proximally and distally, brief island reconstituted in mid-segment  PFA Patent, extensive chronic collaterals Patent, 50% stenosis, extensive chronic collaterals  Pop Occluded proximally, reconstituted at knee via collaterals Occluded  Trif Reconstituted tibioperoneal trunk from collaterals Reconstituted miniscule tibioperoneal trunk from collaterals  AT Occluded Reconstituted from collaterals  Pero Reconstituted from collaterals, miniscule Reconstituted from collaterals, miniscule  PT Patent, reconstituted from chronic collaterals (dominant runoff) Reconstituted from collaterals, miniscule   SPECIMEN(S):  none  INDICATIONS:   Patrick [name] is a 61 y.o. male who presents with bilateral lifestyle limiting intermittent claudication.  The patient presents for: aortogram, bilateral leg runoff, and possible left leg intervention.  I discussed with  the patient the nature of angiographic procedures, especially the limited patencies of any endovascular intervention.  The patient is aware of that the risks of an angiographic procedure include but are not limited to: bleeding, infection, access site complications, renal failure, embolization, rupture of vessel, dissection, possible need for emergent surgical intervention, possible need for surgical procedures to treat the patient's pathology, and stroke and death.  The patient is aware of the risks and agrees to proceed.  DESCRIPTION: After full informed consent was obtained from the patient, the patient was brought back to the angiography suite.  The patient was placed supine upon the angiography table and connected to cardiopulmonary monitoring equipment.  The patient was then given conscious sedation, the amounts of which are documented in the patient's chart.  A circulating radiologic technician maintained continuous monitoring of the patient's cardiopulmonary status.  Additionally, the control room radiologic technician provided backup monitoring throughout the procedure.  The patient was prepped and drape in the standard fashion for an angiographic procedure.  At this point, attention was turned to the right groin.  Under ultrasound guidance, the subcutaneous tissue surrounding the right common femoral artery was anesthesized with 1% lidocaine with epinephrine.  The artery was then cannulated with a micropuncture needle.  The microwire was advanced into the iliac arterial system.  The needle was exchanged for a microsheath, which was loaded into the common femoral artery over the wire.  The microwire was exchanged for a Bentson wire which was advanced into the aorta.  The microsheath was then exchanged for a 5-Fr sheath which was loaded into the common femoral artery.  The Omniflush catheter was then loaded over the wire up to the level of L1.  The catheter was connected to the power injector circuit.   After de-airring and de-clotting the circuit, a  power injector aortogram was completed.  Findings are listed above.  Based on the findings, I felt intervention on the left common iliac artery and external iliac artery was indicated.    Using a Bentson wire and Omniflush catheter, the left common iliac artery was selected.  The catheter and wire were advanced into the external iliac artery.  The wire was exchanged for a Rosen wire using a straight catheter.  The patient's right femoral sheath was exchanged for a 7-Fr Destination sheath, which was lodged in the right common iliac artery, proximally to the stenosis.  The dilator was removed.  The patient was given 11000 units of Heparin intravenously, which was a therapeutic bolus.    I did a hand injection under magnification to image the left iliac arterial system.  Based on the imaging the left common iliac artery was maximally 12 mm and the left external iliac artery external iliac artery 8 mm.  I had concerns that placing a 12-13 mm stent in the external iliac artery would rupture the artery, so I selected a 10 mm x 29 mm balloon expandable stent.  I tried to advance the stent past the aortic bifurcation, but the angle was too steep to past the stent over the [facility] wire, so I exchanged the wire for an Amplatz wire.  Over this wire, I was able to advance the stent into the left common iliac artery.  With some difficulty, I was able to get past the stenosis and deployed the stent at 11 atm for 1 minute with 1 cm of overlap into the external iliac artery.  I then flared the proximal extent of the stent with a 12 mm x 20 mm balloon.  Completion imaging demonstrated resolution of the stenosis without frank rupture.  The left internal iliac artery was still patent.  There was some size mismatch in the common iliac artery but there was no evidence of flow alteration.  At this point, I placed the straight catheter over the wire into the left external iliac artery  and the wire was removed.  The catheter was connected to the power injector circuit.  An automated left leg runoff was completed.  The findings are listed above.  I felt the patient had essentially autobypassed himself so no immediate intervention was needed or possible.  At this point, I turned my attention the right sheath.  I pulled it back to the right external iliac artery.  The sheath was aspirated.  No clots were present and the sheath was reloaded with heparinized saline.  The right sheath was connected to the power injector circuit.  An automated right leg runoff was completed.   The findings are listed above.    The sheath was aspirated.  No clots were present and the sheath was reloaded with heparinized saline.     COMPLICATIONS: none  CONDITION: stable   Adele Barthel, MD, [facility] [place] Office: [phone] Pager: [phone]  04/13/2017, 8:53 AM

## 2017-04-13 NOTE — Progress Notes (Signed)
Pt leaves cath lab holding area in stable condition. Rt groin is unremarkable and dressing is CDI.

## 2017-04-14 ENCOUNTER — Encounter (HOSPITAL_COMMUNITY): Payer: Self-pay | Admitting: Vascular Surgery

## 2017-04-14 DIAGNOSIS — Z23 Encounter for immunization: Secondary | ICD-10-CM | POA: Diagnosis not present

## 2017-04-14 DIAGNOSIS — D631 Anemia in chronic kidney disease: Secondary | ICD-10-CM | POA: Diagnosis not present

## 2017-04-14 DIAGNOSIS — N186 End stage renal disease: Secondary | ICD-10-CM | POA: Diagnosis not present

## 2017-04-14 DIAGNOSIS — N2581 Secondary hyperparathyroidism of renal origin: Secondary | ICD-10-CM | POA: Diagnosis not present

## 2017-04-14 DIAGNOSIS — E1029 Type 1 diabetes mellitus with other diabetic kidney complication: Secondary | ICD-10-CM | POA: Diagnosis not present

## 2017-04-16 DIAGNOSIS — D631 Anemia in chronic kidney disease: Secondary | ICD-10-CM | POA: Diagnosis not present

## 2017-04-16 DIAGNOSIS — N2581 Secondary hyperparathyroidism of renal origin: Secondary | ICD-10-CM | POA: Diagnosis not present

## 2017-04-16 DIAGNOSIS — Z23 Encounter for immunization: Secondary | ICD-10-CM | POA: Diagnosis not present

## 2017-04-16 DIAGNOSIS — E1029 Type 1 diabetes mellitus with other diabetic kidney complication: Secondary | ICD-10-CM | POA: Diagnosis not present

## 2017-04-16 DIAGNOSIS — N186 End stage renal disease: Secondary | ICD-10-CM | POA: Diagnosis not present

## 2017-04-18 DIAGNOSIS — N186 End stage renal disease: Secondary | ICD-10-CM | POA: Diagnosis not present

## 2017-04-18 DIAGNOSIS — D631 Anemia in chronic kidney disease: Secondary | ICD-10-CM | POA: Diagnosis not present

## 2017-04-18 DIAGNOSIS — Z23 Encounter for immunization: Secondary | ICD-10-CM | POA: Diagnosis not present

## 2017-04-18 DIAGNOSIS — E1029 Type 1 diabetes mellitus with other diabetic kidney complication: Secondary | ICD-10-CM | POA: Diagnosis not present

## 2017-04-18 DIAGNOSIS — N2581 Secondary hyperparathyroidism of renal origin: Secondary | ICD-10-CM | POA: Diagnosis not present

## 2017-04-20 ENCOUNTER — Ambulatory Visit (INDEPENDENT_AMBULATORY_CARE_PROVIDER_SITE_OTHER): Payer: Medicare Other | Admitting: Cardiology

## 2017-04-20 ENCOUNTER — Encounter: Payer: Self-pay | Admitting: Cardiology

## 2017-04-20 VITALS — BP 130/70 | HR 80 | Ht 66.0 in | Wt 231.0 lb

## 2017-04-20 DIAGNOSIS — I7092 Chronic total occlusion of artery of the extremities: Secondary | ICD-10-CM

## 2017-04-20 DIAGNOSIS — I10 Essential (primary) hypertension: Secondary | ICD-10-CM | POA: Diagnosis not present

## 2017-04-20 DIAGNOSIS — I208 Other forms of angina pectoris: Secondary | ICD-10-CM | POA: Diagnosis not present

## 2017-04-20 DIAGNOSIS — I483 Typical atrial flutter: Secondary | ICD-10-CM

## 2017-04-20 DIAGNOSIS — I209 Angina pectoris, unspecified: Secondary | ICD-10-CM

## 2017-04-20 MED ORDER — ISOSORBIDE MONONITRATE ER 60 MG PO TB24: 60.0000 mg | ORAL_TABLET | Freq: Every day | ORAL | 3 refills | Status: DC

## 2017-04-20 MED ORDER — CARVEDILOL 6.25 MG PO TABS: 6.2500 mg | ORAL_TABLET | Freq: Two times a day (BID) | ORAL | 2 refills | Status: DC

## 2017-04-20 NOTE — Patient Instructions (Addendum)
Medication Instructions:  Your physician has recommended you make the following change in your medication:  1. INCREASE Imdur to 60 mg daily 2. START Carvedilol 6.25 mg twice daily  -- If you need a refill on your cardiac medications before your next appointment, please call your pharmacy. --  Labwork: None ordered  Testing/Procedures: None ordered  Follow-Up: Your physician wants you to follow-up in: 6 months  with Dr. Curt Bears.  You will receive a reminder letter in the mail two months in advance. If you don't receive a letter, please call our office to schedule the follow-up appointment.  Thank you for choosing [facility]!!   Trinidad Curet, RN [phone]  Any Other Special Instructions Will Be Listed Below (If Applicable).  Carvedilol tablets What is this medicine? CARVEDILOL (KAR ve dil ol) is a beta-blocker. Beta-blockers reduce the workload on the heart and help it to beat more regularly. This medicine is used to treat high blood pressure and heart failure. This medicine may be used for other purposes; ask your health care provider or pharmacist if you have questions. COMMON BRAND NAME(S): Coreg What should I tell my health care provider before I take this medicine? They need to know if you have any of these conditions: -circulation problems -diabetes -history of heart attack or heart disease -liver disease -lung or breathing disease, like asthma or emphysema -pheochromocytoma -slow or irregular heartbeat -thyroid disease -an unusual or allergic reaction to carvedilol, other beta-blockers, medicines, foods, dyes, or preservatives -pregnant or trying to get pregnant -breast-feeding How should I use this medicine? Take this medicine by mouth with a glass of water. Follow the directions on the prescription label. It is best to take the tablets with food. Take your doses at regular intervals. Do not take your medicine more often than directed. Do not stop taking  except on the advice of your doctor or health care professional. Talk to your pediatrician regarding the use of this medicine in children. Special care may be needed. Overdosage: If you think you have taken too much of this medicine contact a poison control center or emergency room at once. NOTE: This medicine is only for you. Do not share this medicine with others. What if I miss a dose? If you miss a dose, take it as soon as you can. If it is almost time for your next dose, take only that dose. Do not take double or extra doses. What may interact with this medicine? This medicine may interact with the following medications: -certain medicines for blood pressure, heart disease, irregular heart beat -certain medicines for depression, like fluoxetine or paroxetine -certain medicines for diabetes, like glipizide or glyburide -cimetidine -clonidine -cyclosporine -digoxin -MAOIs like Carbex, Eldepryl, Marplan, Nardil, and Parnate -reserpine -rifampin This list may not describe all possible interactions. Give your health care provider a list of all the medicines, herbs, non-prescription drugs, or dietary supplements you use. Also tell them if you smoke, drink alcohol, or use illegal drugs. Some items may interact with your medicine. What should I watch for while using this medicine? Check your heart rate and blood pressure regularly while you are taking this medicine. Ask your doctor or health care professional what your heart rate and blood pressure should be, and when you should contact him or her. Do not stop taking this medicine suddenly. This could lead to serious heart-related effects. Contact your doctor or health care professional if you have difficulty breathing while taking this drug. Check your weight daily. Ask your  doctor or health care professional when you should notify him/her of any weight gain. You may get drowsy or dizzy. Do not drive, use machinery, or do anything that requires  mental alertness until you know how this medicine affects you. To reduce the risk of dizzy or fainting spells, do not sit or stand up quickly. Alcohol can make you more drowsy, and increase flushing and rapid heartbeats. Avoid alcoholic drinks. If you have diabetes, check your blood sugar as directed. Tell your doctor if you have changes in your blood sugar while you are taking this medicine. If you are going to have surgery, tell your doctor or health care professional that you are taking this medicine. What side effects may I notice from receiving this medicine? Side effects that you should report to your doctor or health care professional as soon as possible: -allergic reactions like skin rash, itching or hives, swelling of the face, lips, or tongue -breathing problems -dark urine -irregular heartbeat -swollen legs or ankles -vomiting -yellowing of the eyes or skin Side effects that usually do not require medical attention (report to your doctor or health care professional if they continue or are bothersome): -change in sex drive or performance -diarrhea -dry eyes (especially if wearing contact lenses) -dry, itching skin -headache -nausea -unusually tired This list may not describe all possible side effects. Call your doctor for medical advice about side effects. You may report side effects to FDA at 1-800-FDA-1088. Where should I keep my medicine? Keep out of the reach of children. Store at room temperature below 30 degrees C (86 degrees F). Protect from moisture. Keep container tightly closed. Throw away any unused medicine after the expiration date. NOTE: This sheet is a summary. It may not cover all possible information. If you have questions about this medicine, talk to your doctor, pharmacist, or health care provider.  2018 Elsevier/Gold Standard (2013-03-20 14:12:02)

## 2017-04-20 NOTE — Addendum Note (Signed)
Addended by: Stanton Kidney on: 04/20/2017 11:06 AM   Modules accepted: Orders

## 2017-04-20 NOTE — Progress Notes (Signed)
Electrophysiology Office Note   Date:  04/20/2017   ID:  Patrick [name], DOB 25-Oct-1955, MRN [id number]  PCP:  Nolene Ebbs, MD  Cardiologist:  Einar Gip Primary Electrophysiologist:  Constance Haw, MD    Chief Complaint  Patient presents with  . Follow-up    Typical AFlutter     History of Present Illness: Patrick [name] is a 61 y.o. male who presents today for electrophysiology evaluation.   He presented to the hospital in atrial flutter in October. Had ablation 09/24/16. Presenting today for follow-up of his atrial flutter.   Today, denies symptoms of palpitations, orthopnea, PND, lower extremity edema, claudication, dizziness, presyncope, syncope, bleeding, or neurologic sequela. The patient is tolerating medications without difficulties. He remains in sinus rhythm today. He does complain of chest discomfort. This occurs after dialysis, and also when he exerts himself. The chest discomfort doesn't improve with rest. It is associated with shortness of breath.    Past Medical History:  Diagnosis Date  . Arthritis    "back" (09/24/2016)  . ESRD (end stage renal disease) on dialysis [facility])    "TTS; [place]" (09/24/2016)  . Gout   . History of blood transfusion 2009- 2016   "I've had several; low HgB"  . History of kidney stones    treated with nephrectomy  . Hypertension   . Impotence of organic origin   . Migraine    "stopped in my 30's; they were related to high BP" (09/24/2016)  . Myocardial infarction [facility]) '96  . Pneumonia 01/2015   " a touch & I was in the hosp."  . Secondary hyperparathyroidism ([place])   . Type 2 diabetes mellitus ([place])    Past Surgical History:  Procedure Laterality Date  . A-FLUTTER ABLATION N/A 09/24/2016   Procedure: A-Flutter Ablation;  Surgeon: Semiah Konczal Meredith Leeds, MD;  Location: [place] [facility];  Service: Cardiovascular;  Laterality: N/A;  . ABDOMINAL AORTOGRAM W/LOWER EXTREMITY N/A 04/13/2017   Procedure: ABDOMINAL AORTOGRAM W/LOWER  EXTREMITY;  Surgeon: Conrad [place], MD;  Location: [place] [facility];  Service: Cardiovascular;  Laterality: N/A;  Bilater lower extermity  . APPENDECTOMY    . AV FISTULA PLACEMENT Right 09/18/2014   Procedure: INSERTION OF ARTERIOVENOUS (AV) GORE-TEX GRAFT ARM USING 4-7MM  X 45CM STRETCH GORE-TEX VASCULAR GRAFT;  Surgeon: Rosetta Posner, MD;  Location: [place];  Service: Vascular;  Laterality: Right;  . AV FISTULA PLACEMENT Left 07/07/2016   Procedure: INSERTION OF LEFT BRACHIAL TO AXILLARY ARTERIOVENOUS (AV) GORE-TEX ARM GRAFT;  Surgeon: Conrad Teasdale, MD;  Location: [place];  Service: Vascular;  Laterality: Left;  . CARDIAC CATHETERIZATION  ~ 2016  . 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 HERNIA REPAIR  2017   with nephrectomy  . IR GENERIC HISTORICAL  05/11/2016   IR FLUORO GUIDE CV LINE LEFT 05/11/2016 Corrie Mckusick, DO [facility]  . IR GENERIC HISTORICAL  05/11/2016   IR US GUIDE VASC ACCESS LEFT 05/11/2016 Corrie Mckusick, DO [facility]  . IR GENERIC HISTORICAL  05/11/2016   IR US GUIDE VASC ACCESS RIGHT 05/11/2016 Corrie Mckusick, DO [facility]  . IR GENERIC HISTORICAL  05/11/2016   IR RADIOLOGY PERIPHERAL GUIDED IV START 05/11/2016 Corrie Mckusick, DO [facility]  . KIDNEY TRANSPLANT  2009  . NEPHRECTOMY  2017   transplant rejected   . PERIPHERAL VASCULAR CATHETERIZATION N/A 06/04/2016   Procedure: Upper Extremity Venography;  Surgeon: Erlene Quan  Dione Plover, MD;  Location: [place] [facility];  Service: Cardiovascular;  Laterality: N/A;  . PERIPHERAL VASCULAR INTERVENTION  04/13/2017   Procedure: PERIPHERAL VASCULAR INTERVENTION;  Surgeon: Conrad Ridott, MD;  Location: [place] [facility];  Service: Cardiovascular;;  Lt. Common/Exernal  Iliac     Current Outpatient Prescriptions  Medication Sig Dispense Refill  . acetaminophen (TYLENOL) 500 MG tablet Take 1,000 mg by mouth every 6 (six) hours as needed for  moderate pain or headache.    . albuterol (PROVENTIL HFA;VENTOLIN HFA) 108 (90 Base) MCG/ACT inhaler Inhale 1-2 puffs into the lungs every 6 (six) hours as needed for wheezing or shortness of breath.    Marland Kitchen aspirin EC 81 MG tablet Take 81 mg by mouth daily.    . calcium acetate (PHOSLO) 667 MG capsule Take 2,001 mg by mouth 3 (three) times daily with meals.     . calcium-vitamin D (OSCAL WITH D) 500-200 MG-UNIT tablet Take 1 tablet by mouth 2 (two) times a week.    . clopidogrel (PLAVIX) 75 MG tablet Take 1 tablet (75 mg total) by mouth daily. 30 tablet 11  . colchicine 0.6 MG tablet Take 1 tablet (0.6 mg total) by mouth every 14 (fourteen) days. (Patient taking differently: Take 0.6 mg by mouth daily as needed (for gout flare up). )    . isosorbide mononitrate (IMDUR) 30 MG 24 hr tablet Take 1 tablet (30 mg total) by mouth daily. 30 tablet 0   No current facility-administered medications for this visit.     Allergies:   Lipitor [atorvastatin]; Metoprolol; and Iodinated diagnostic agents   Social History:  The patient  reports that he quit smoking about 2 years ago. His smoking use included Cigarettes. He has never used smokeless tobacco. He reports that he drinks alcohol. He reports that he does not use drugs.   Family History:  The patient's family history includes Hyperlipidemia in his mother; Hypertension in his mother.    ROS:  Please see the history of present illness.   Otherwise, review of systems is positive for Chest pain with activity, leg pain, dyspnea on exertion.   All other systems are reviewed and negative.   PHYSICAL EXAM: VS:  BP 130/70   Pulse 80   Ht 5\' 6"  (1.676 m)   Wt 231 lb (104.8 kg)   BMI 37.28 kg/m  , BMI Body mass index is 37.28 kg/m. GEN: Well nourished, well developed, in no acute distress  HEENT: normal  Neck: no JVD, carotid bruits, or masses Cardiac: RRR; no murmurs, rubs, or gallops,no edema  Respiratory:  clear to auscultation bilaterally, normal work  of breathing GI: soft, nontender, nondistended, + BS MS: no deformity or atrophy  Skin: warm and dry Neuro:  Strength and sensation are intact Psych: euthymic mood, full affect  EKG:  EKG is not ordered today. Personal review of the ekg ordered 10/22/16 shows Sinus rhythm, rate 92  Recent Labs: 05/25/2016: TSH 5.113 09/25/2016: Platelets 136 04/13/2017: BUN 61; Creatinine, Ser 16.70; Hemoglobin 10.2; Potassium 4.3; Sodium 137    Lipid Panel     Component Value Date/Time   CHOL 192 05/25/2016 1542   TRIG 249 (H) 05/25/2016 1542   HDL 34 (L) 05/25/2016 1542   CHOLHDL 5.6 05/25/2016 1542   VLDL 50 (H) 05/25/2016 1542   LDLCALC 108 (H) 05/25/2016 1542     Wt Readings from Last 3 Encounters:  04/20/17 231 lb (104.8 kg)  04/13/17 224 lb (101.6 kg)  04/03/17  224 lb 1.6 oz (101.7 kg)      Other studies Reviewed: Additional studies/ records that were reviewed today include: TTE 01/25/16  Review of the above records today demonstrates:  - Left ventricle: There was mild concentric hypertrophy. Systolic   function was mildly reduced. The estimated ejection fraction was   in the range of 45% to 50%. Diffuse hypokinesis. Features are   consistent with a pseudonormal left ventricular filling pattern,   with concomitant abnormal relaxation and increased filling   pressure (grade 2 diastolic dysfunction). Doppler parameters are   consistent with both elevated ventricular end-diastolic filling   pressure and elevated left atrial filling pressure. - Aortic valve: Normal-sized, mildly calcified annulus. Trileaflet.   Moderate calcification involving the left coronary and   noncoronary cusp. There was very mild stenosis. Mean gradient   (S): 13 mm Hg. Peak gradient (S): 26 mm Hg. Valve area (VTI):   1.02 cm^2. Valve area (Vmax): 0.86 cm^2. Valve area (Vmean): 0.9   cm^2. - Mitral valve: Calcified annulus. There was mild regurgitation.   Valve area by continuity equation (using LVOT flow):  1.35 cm^2. - Left atrium: The atrium was mildly dilated. - Atrial septum: No defect or patent foramen ovale was identified.   ASSESSMENT AND PLAN:  1.  Atrial flutter: Patient 09/24/16. Remains in sinus rhythm. Currently not anticoagulated.   2. Hypertension: Well-controlled today. No changes.  3. Chest pain: Had an area of ischemia which was small on his stress test in 2016. Due to his chest pain, it is likely that it is caused by coronary insufficiency. We'll increase his indoor to 60 mg, and start Coreg 6.25 mg twice a day.  Current medicines are reviewed at length with the patient today.   The patient does not have concerns regarding his medicines.  The following changes were made today:  Increase him door, start Coreg  Labs/ tests ordered today include:  No orders of the defined types were placed in this encounter.    Disposition:   FU with Syenna Nazir 6 months  Signed, Brayleigh Rybacki Meredith Leeds, MD  04/20/2017 10:52 AM     [facility] [address] [place] [address] [phone] (office) [phone] (fax)

## 2017-04-21 DIAGNOSIS — D631 Anemia in chronic kidney disease: Secondary | ICD-10-CM | POA: Diagnosis not present

## 2017-04-21 DIAGNOSIS — E1029 Type 1 diabetes mellitus with other diabetic kidney complication: Secondary | ICD-10-CM | POA: Diagnosis not present

## 2017-04-21 DIAGNOSIS — Z23 Encounter for immunization: Secondary | ICD-10-CM | POA: Diagnosis not present

## 2017-04-21 DIAGNOSIS — N186 End stage renal disease: Secondary | ICD-10-CM | POA: Diagnosis not present

## 2017-04-21 DIAGNOSIS — N2581 Secondary hyperparathyroidism of renal origin: Secondary | ICD-10-CM | POA: Diagnosis not present

## 2017-04-23 DIAGNOSIS — E1029 Type 1 diabetes mellitus with other diabetic kidney complication: Secondary | ICD-10-CM | POA: Diagnosis not present

## 2017-04-23 DIAGNOSIS — N2581 Secondary hyperparathyroidism of renal origin: Secondary | ICD-10-CM | POA: Diagnosis not present

## 2017-04-23 DIAGNOSIS — N186 End stage renal disease: Secondary | ICD-10-CM | POA: Diagnosis not present

## 2017-04-23 DIAGNOSIS — D631 Anemia in chronic kidney disease: Secondary | ICD-10-CM | POA: Diagnosis not present

## 2017-04-23 DIAGNOSIS — Z23 Encounter for immunization: Secondary | ICD-10-CM | POA: Diagnosis not present

## 2017-04-23 DIAGNOSIS — I4891 Unspecified atrial fibrillation: Secondary | ICD-10-CM | POA: Diagnosis not present

## 2017-04-25 DIAGNOSIS — D631 Anemia in chronic kidney disease: Secondary | ICD-10-CM | POA: Diagnosis not present

## 2017-04-25 DIAGNOSIS — E1029 Type 1 diabetes mellitus with other diabetic kidney complication: Secondary | ICD-10-CM | POA: Diagnosis not present

## 2017-04-25 DIAGNOSIS — N186 End stage renal disease: Secondary | ICD-10-CM | POA: Diagnosis not present

## 2017-04-25 DIAGNOSIS — N2581 Secondary hyperparathyroidism of renal origin: Secondary | ICD-10-CM | POA: Diagnosis not present

## 2017-04-25 DIAGNOSIS — Z23 Encounter for immunization: Secondary | ICD-10-CM | POA: Diagnosis not present

## 2017-04-26 DIAGNOSIS — Z992 Dependence on renal dialysis: Secondary | ICD-10-CM | POA: Diagnosis not present

## 2017-04-26 DIAGNOSIS — T8610 Unspecified complication of kidney transplant: Secondary | ICD-10-CM | POA: Diagnosis not present

## 2017-04-26 DIAGNOSIS — N186 End stage renal disease: Secondary | ICD-10-CM | POA: Diagnosis not present

## 2017-04-28 DIAGNOSIS — E1029 Type 1 diabetes mellitus with other diabetic kidney complication: Secondary | ICD-10-CM | POA: Diagnosis not present

## 2017-04-28 DIAGNOSIS — N2581 Secondary hyperparathyroidism of renal origin: Secondary | ICD-10-CM | POA: Diagnosis not present

## 2017-04-28 DIAGNOSIS — N186 End stage renal disease: Secondary | ICD-10-CM | POA: Diagnosis not present

## 2017-04-28 DIAGNOSIS — D631 Anemia in chronic kidney disease: Secondary | ICD-10-CM | POA: Diagnosis not present

## 2017-04-30 DIAGNOSIS — D631 Anemia in chronic kidney disease: Secondary | ICD-10-CM | POA: Diagnosis not present

## 2017-04-30 DIAGNOSIS — N2581 Secondary hyperparathyroidism of renal origin: Secondary | ICD-10-CM | POA: Diagnosis not present

## 2017-04-30 DIAGNOSIS — N186 End stage renal disease: Secondary | ICD-10-CM | POA: Diagnosis not present

## 2017-04-30 DIAGNOSIS — E1029 Type 1 diabetes mellitus with other diabetic kidney complication: Secondary | ICD-10-CM | POA: Diagnosis not present

## 2017-05-02 DIAGNOSIS — N2581 Secondary hyperparathyroidism of renal origin: Secondary | ICD-10-CM | POA: Diagnosis not present

## 2017-05-02 DIAGNOSIS — D631 Anemia in chronic kidney disease: Secondary | ICD-10-CM | POA: Diagnosis not present

## 2017-05-02 DIAGNOSIS — E1029 Type 1 diabetes mellitus with other diabetic kidney complication: Secondary | ICD-10-CM | POA: Diagnosis not present

## 2017-05-02 DIAGNOSIS — N186 End stage renal disease: Secondary | ICD-10-CM | POA: Diagnosis not present

## 2017-05-05 DIAGNOSIS — D631 Anemia in chronic kidney disease: Secondary | ICD-10-CM | POA: Diagnosis not present

## 2017-05-05 DIAGNOSIS — N186 End stage renal disease: Secondary | ICD-10-CM | POA: Diagnosis not present

## 2017-05-05 DIAGNOSIS — N2581 Secondary hyperparathyroidism of renal origin: Secondary | ICD-10-CM | POA: Diagnosis not present

## 2017-05-05 DIAGNOSIS — E1029 Type 1 diabetes mellitus with other diabetic kidney complication: Secondary | ICD-10-CM | POA: Diagnosis not present

## 2017-05-07 DIAGNOSIS — N2581 Secondary hyperparathyroidism of renal origin: Secondary | ICD-10-CM | POA: Diagnosis not present

## 2017-05-07 DIAGNOSIS — D631 Anemia in chronic kidney disease: Secondary | ICD-10-CM | POA: Diagnosis not present

## 2017-05-07 DIAGNOSIS — E1029 Type 1 diabetes mellitus with other diabetic kidney complication: Secondary | ICD-10-CM | POA: Diagnosis not present

## 2017-05-07 DIAGNOSIS — N186 End stage renal disease: Secondary | ICD-10-CM | POA: Diagnosis not present

## 2017-05-09 DIAGNOSIS — E1029 Type 1 diabetes mellitus with other diabetic kidney complication: Secondary | ICD-10-CM | POA: Diagnosis not present

## 2017-05-09 DIAGNOSIS — N2581 Secondary hyperparathyroidism of renal origin: Secondary | ICD-10-CM | POA: Diagnosis not present

## 2017-05-09 DIAGNOSIS — N186 End stage renal disease: Secondary | ICD-10-CM | POA: Diagnosis not present

## 2017-05-09 DIAGNOSIS — D631 Anemia in chronic kidney disease: Secondary | ICD-10-CM | POA: Diagnosis not present

## 2017-05-12 DIAGNOSIS — N186 End stage renal disease: Secondary | ICD-10-CM | POA: Diagnosis not present

## 2017-05-12 DIAGNOSIS — N2581 Secondary hyperparathyroidism of renal origin: Secondary | ICD-10-CM | POA: Diagnosis not present

## 2017-05-12 DIAGNOSIS — E1029 Type 1 diabetes mellitus with other diabetic kidney complication: Secondary | ICD-10-CM | POA: Diagnosis not present

## 2017-05-12 DIAGNOSIS — D631 Anemia in chronic kidney disease: Secondary | ICD-10-CM | POA: Diagnosis not present

## 2017-05-14 DIAGNOSIS — N186 End stage renal disease: Secondary | ICD-10-CM | POA: Diagnosis not present

## 2017-05-14 DIAGNOSIS — D631 Anemia in chronic kidney disease: Secondary | ICD-10-CM | POA: Diagnosis not present

## 2017-05-14 DIAGNOSIS — E1029 Type 1 diabetes mellitus with other diabetic kidney complication: Secondary | ICD-10-CM | POA: Diagnosis not present

## 2017-05-14 DIAGNOSIS — N2581 Secondary hyperparathyroidism of renal origin: Secondary | ICD-10-CM | POA: Diagnosis not present

## 2017-05-14 NOTE — Progress Notes (Signed)
Postoperative Visit   History of Present Illness   Patrick [name] is a 61 y.o. male who presents for postoperative follow-up from procedure on 04/13/17:  1.  PTA+S L CIA and EIA  The patient notes no significant changes in lower extremity symptoms but somewhat better.  The patient is able to complete his activities of daily living with some difficulty.  The patient's current symptoms are: intermittent claudication with short distances.  Past Medical History, Past Surgical History, Social History, Family History, Medications, Allergies, and Review of Systems are unchanged from previous evaluation on 04/13/17.  Current Outpatient Prescriptions  Medication Sig Dispense Refill  . acetaminophen (TYLENOL) 500 MG tablet Take 1,000 mg by mouth every 6 (six) hours as needed for moderate pain or headache.    . albuterol (PROVENTIL HFA;VENTOLIN HFA) 108 (90 Base) MCG/ACT inhaler Inhale 1-2 puffs into the lungs every 6 (six) hours as needed for wheezing or shortness of breath.    Marland Kitchen aspirin EC 81 MG tablet Take 81 mg by mouth daily.    . calcium acetate (PHOSLO) 667 MG capsule Take 2,001 mg by mouth 3 (three) times daily with meals.     . calcium-vitamin D (OSCAL WITH D) 500-200 MG-UNIT tablet Take 1 tablet by mouth 2 (two) times a week.    . carvedilol (COREG) 6.25 MG tablet Take 1 tablet (6.25 mg total) by mouth 2 (two) times daily. 180 tablet 2  . clopidogrel (PLAVIX) 75 MG tablet Take 1 tablet (75 mg total) by mouth daily. 30 tablet 11  . colchicine 0.6 MG tablet Take 1 tablet (0.6 mg total) by mouth every 14 (fourteen) days. (Patient taking differently: Take 0.6 mg by mouth daily as needed (for gout flare up). )    . isosorbide mononitrate (IMDUR) 60 MG 24 hr tablet Take 1 tablet (60 mg total) by mouth daily. 90 tablet 3   No current facility-administered medications for this visit.     ROS: no rest pain, no foot wounds   For VQI Use Only   PRE-ADM LIVING: Home  AMB STATUS:  Ambulatory   Physical Examination   Vitals:   05/15/17 1411  BP: 111/70  Pulse: 83  Resp: 20  Temp: 98.1 F (36.7 C)  TempSrc: Oral  SpO2: 95%  Weight: 231 lb (104.8 kg)  Height: 5' 6.5" (1.689 m)   Body mass index is 36.73 kg/m.  General Alert, O x 3, WD, NAD  Pulmonary Sym exp, good B air movt, CTA B  Cardiac RRR, Nl S1, S2, no Murmurs, No rubs, No S3,S4  Vascular Vessel Right Left  Radial Palpable Palpable  Brachial Palpable Palpable  Carotid Palpable, No Bruit Palpable, No Bruit  Aorta Not palpable N/A  Femoral Palpable Palpable  Popliteal Not palpable Not palpable  PT Not palpable Not palpable  DP Not palpable Not palpable    Gastrointestinal soft, non-distended, non-tender to palpation, No guarding or rebound, no HSM, no masses, no CVAT B, No palpable prominent aortic pulse,    Musculoskeletal M/S 5/5 throughout  , Extremities without ischemic changes  , No edema present,  Neurologic  Pain and light touch intact in extremities , Motor exam as listed above    Medical Decision Making   [name] is a 61 y.o. male who presents s/p L CIA and EIA PTA+S for distal CIA/proximal EIA stenosis >90%.  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 In this patient with multiple co-morbidities, I would try  to avoid surgery due to the higher mortality and morbidity rate that can be expected. As he currently has intermittent claudication rather than critical limb ischemia, I don't feel surgical bypass is indicated. Based on his angiographic findings, this patient needs: maximal medical mgmt.  We discussed the walking plan concept. Pt will follow up in 6 months for BLE ABI.. I discussed in depth with the patient the nature of atherosclerosis, and emphasized the importance of maximal medical management including strict control of blood pressure, blood glucose, and lipid levels, obtaining regular exercise, and cessation of smoking.  The patient is  aware that without maximal medical management the underlying atherosclerotic disease process will progress, limiting the benefit of any interventions. The patient is currently not on a statin due to reported allergy.  The patient is currently on an anti-platelet: ASA.  Thank you for allowing [place] to participate in this patient's care.   Adele Barthel, MD, FACS [facility] Office: [phone] Pager: [phone]

## 2017-05-15 ENCOUNTER — Ambulatory Visit (INDEPENDENT_AMBULATORY_CARE_PROVIDER_SITE_OTHER): Payer: Medicare Other | Admitting: Vascular Surgery

## 2017-05-15 ENCOUNTER — Encounter: Payer: Self-pay | Admitting: Vascular Surgery

## 2017-05-15 VITALS — BP 111/70 | HR 83 | Temp 98.1°F | Resp 20 | Ht 66.5 in | Wt 231.0 lb

## 2017-05-15 DIAGNOSIS — I70213 Atherosclerosis of native arteries of extremities with intermittent claudication, bilateral legs: Secondary | ICD-10-CM

## 2017-05-15 DIAGNOSIS — I7092 Chronic total occlusion of artery of the extremities: Secondary | ICD-10-CM | POA: Diagnosis not present

## 2017-05-16 DIAGNOSIS — N186 End stage renal disease: Secondary | ICD-10-CM | POA: Diagnosis not present

## 2017-05-16 DIAGNOSIS — D631 Anemia in chronic kidney disease: Secondary | ICD-10-CM | POA: Diagnosis not present

## 2017-05-16 DIAGNOSIS — E1029 Type 1 diabetes mellitus with other diabetic kidney complication: Secondary | ICD-10-CM | POA: Diagnosis not present

## 2017-05-16 DIAGNOSIS — N2581 Secondary hyperparathyroidism of renal origin: Secondary | ICD-10-CM | POA: Diagnosis not present

## 2017-05-19 DIAGNOSIS — N186 End stage renal disease: Secondary | ICD-10-CM | POA: Diagnosis not present

## 2017-05-19 DIAGNOSIS — D631 Anemia in chronic kidney disease: Secondary | ICD-10-CM | POA: Diagnosis not present

## 2017-05-19 DIAGNOSIS — N2581 Secondary hyperparathyroidism of renal origin: Secondary | ICD-10-CM | POA: Diagnosis not present

## 2017-05-19 DIAGNOSIS — E1029 Type 1 diabetes mellitus with other diabetic kidney complication: Secondary | ICD-10-CM | POA: Diagnosis not present

## 2017-05-21 DIAGNOSIS — I4891 Unspecified atrial fibrillation: Secondary | ICD-10-CM | POA: Diagnosis not present

## 2017-05-21 DIAGNOSIS — D631 Anemia in chronic kidney disease: Secondary | ICD-10-CM | POA: Diagnosis not present

## 2017-05-21 DIAGNOSIS — N186 End stage renal disease: Secondary | ICD-10-CM | POA: Diagnosis not present

## 2017-05-21 DIAGNOSIS — N2581 Secondary hyperparathyroidism of renal origin: Secondary | ICD-10-CM | POA: Diagnosis not present

## 2017-05-21 DIAGNOSIS — E1029 Type 1 diabetes mellitus with other diabetic kidney complication: Secondary | ICD-10-CM | POA: Diagnosis not present

## 2017-05-23 DIAGNOSIS — N186 End stage renal disease: Secondary | ICD-10-CM | POA: Diagnosis not present

## 2017-05-23 DIAGNOSIS — D631 Anemia in chronic kidney disease: Secondary | ICD-10-CM | POA: Diagnosis not present

## 2017-05-23 DIAGNOSIS — N2581 Secondary hyperparathyroidism of renal origin: Secondary | ICD-10-CM | POA: Diagnosis not present

## 2017-05-23 DIAGNOSIS — E1029 Type 1 diabetes mellitus with other diabetic kidney complication: Secondary | ICD-10-CM | POA: Diagnosis not present

## 2017-05-25 ENCOUNTER — Encounter: Payer: Self-pay | Admitting: Endocrinology

## 2017-05-25 DIAGNOSIS — E119 Type 2 diabetes mellitus without complications: Secondary | ICD-10-CM | POA: Diagnosis not present

## 2017-05-25 LAB — HM DIABETES EYE EXAM

## 2017-05-26 DIAGNOSIS — E1029 Type 1 diabetes mellitus with other diabetic kidney complication: Secondary | ICD-10-CM | POA: Diagnosis not present

## 2017-05-26 DIAGNOSIS — N2581 Secondary hyperparathyroidism of renal origin: Secondary | ICD-10-CM | POA: Diagnosis not present

## 2017-05-26 DIAGNOSIS — D631 Anemia in chronic kidney disease: Secondary | ICD-10-CM | POA: Diagnosis not present

## 2017-05-26 DIAGNOSIS — N186 End stage renal disease: Secondary | ICD-10-CM | POA: Diagnosis not present

## 2017-05-27 DIAGNOSIS — N186 End stage renal disease: Secondary | ICD-10-CM | POA: Diagnosis not present

## 2017-05-27 DIAGNOSIS — Z992 Dependence on renal dialysis: Secondary | ICD-10-CM | POA: Diagnosis not present

## 2017-05-27 DIAGNOSIS — T8610 Unspecified complication of kidney transplant: Secondary | ICD-10-CM | POA: Diagnosis not present

## 2017-05-28 DIAGNOSIS — N2581 Secondary hyperparathyroidism of renal origin: Secondary | ICD-10-CM | POA: Diagnosis not present

## 2017-05-28 DIAGNOSIS — N186 End stage renal disease: Secondary | ICD-10-CM | POA: Diagnosis not present

## 2017-05-28 DIAGNOSIS — D631 Anemia in chronic kidney disease: Secondary | ICD-10-CM | POA: Diagnosis not present

## 2017-05-28 DIAGNOSIS — E1029 Type 1 diabetes mellitus with other diabetic kidney complication: Secondary | ICD-10-CM | POA: Diagnosis not present

## 2017-05-30 DIAGNOSIS — N2581 Secondary hyperparathyroidism of renal origin: Secondary | ICD-10-CM | POA: Diagnosis not present

## 2017-05-30 DIAGNOSIS — D631 Anemia in chronic kidney disease: Secondary | ICD-10-CM | POA: Diagnosis not present

## 2017-05-30 DIAGNOSIS — E1029 Type 1 diabetes mellitus with other diabetic kidney complication: Secondary | ICD-10-CM | POA: Diagnosis not present

## 2017-05-30 DIAGNOSIS — N186 End stage renal disease: Secondary | ICD-10-CM | POA: Diagnosis not present

## 2017-06-02 DIAGNOSIS — N186 End stage renal disease: Secondary | ICD-10-CM | POA: Diagnosis not present

## 2017-06-02 DIAGNOSIS — E1029 Type 1 diabetes mellitus with other diabetic kidney complication: Secondary | ICD-10-CM | POA: Diagnosis not present

## 2017-06-02 DIAGNOSIS — D631 Anemia in chronic kidney disease: Secondary | ICD-10-CM | POA: Diagnosis not present

## 2017-06-02 DIAGNOSIS — N2581 Secondary hyperparathyroidism of renal origin: Secondary | ICD-10-CM | POA: Diagnosis not present

## 2017-06-02 NOTE — Addendum Note (Signed)
Addended by: Lianne Cure A on: 06/02/2017 12:43 PM   Modules accepted: Orders

## 2017-06-04 DIAGNOSIS — N2581 Secondary hyperparathyroidism of renal origin: Secondary | ICD-10-CM | POA: Diagnosis not present

## 2017-06-04 DIAGNOSIS — D631 Anemia in chronic kidney disease: Secondary | ICD-10-CM | POA: Diagnosis not present

## 2017-06-04 DIAGNOSIS — N186 End stage renal disease: Secondary | ICD-10-CM | POA: Diagnosis not present

## 2017-06-04 DIAGNOSIS — E1029 Type 1 diabetes mellitus with other diabetic kidney complication: Secondary | ICD-10-CM | POA: Diagnosis not present

## 2017-06-05 ENCOUNTER — Telehealth: Payer: Self-pay | Admitting: Cardiology

## 2017-06-05 ENCOUNTER — Encounter: Payer: Self-pay | Admitting: Endocrinology

## 2017-06-05 ENCOUNTER — Ambulatory Visit (INDEPENDENT_AMBULATORY_CARE_PROVIDER_SITE_OTHER): Payer: Medicare Other | Admitting: Endocrinology

## 2017-06-05 VITALS — BP 82/58 | HR 90 | Wt 226.6 lb

## 2017-06-05 DIAGNOSIS — I7092 Chronic total occlusion of artery of the extremities: Secondary | ICD-10-CM | POA: Diagnosis not present

## 2017-06-05 DIAGNOSIS — N184 Chronic kidney disease, stage 4 (severe): Secondary | ICD-10-CM | POA: Diagnosis not present

## 2017-06-05 DIAGNOSIS — E1122 Type 2 diabetes mellitus with diabetic chronic kidney disease: Secondary | ICD-10-CM

## 2017-06-05 LAB — POCT GLYCOSYLATED HEMOGLOBIN (HGB A1C): Hemoglobin A1C: 6.1

## 2017-06-05 MED ORDER — REPAGLINIDE 0.5 MG PO TABS: 0.5000 mg | ORAL_TABLET | Freq: Every day | ORAL | 3 refills | Status: DC

## 2017-06-05 NOTE — Telephone Encounter (Signed)
Pt calls in reporting chest pain/discomfort unchanged since he was seen by [facility] in September. Occurs when he exerts himself and after dialysis.  This is unchanged since September OV.  Advised pt he should contact his primarly cardiologist, Dr. Einar Gip, to discuss.  Pt refuses, states he hasn't seen him in a year and he does not want to go back to him.  Discussed briefly w/ Dr. Angelena Form, DOD, who recommends he f/u up w/ [place] until he can establish w/ a new cardiologist -- pt still does not wish to return to [facility] office.   Informed pt that I can discuss w/ Dr. Curt Bears next week to get suggestion/advisement and get him an appt to establish w/ a general cardiologist in our office. Advised to go to emergency room to be evaluated, especially is chest pain changes in nature but pt refuses and will wait until Dr. Curt Bears can review next Wednesday.

## 2017-06-05 NOTE — Telephone Encounter (Signed)
Pt c/o of Chest Pain: STAT if CP now or developed within 24 hours  1. Are you having CP right now? no  2. Are you experiencing any other symptoms (ex. SOB, nausea, vomiting, sweating)? no 3. How long have you been experiencing CP? Since the last time he was seen in ov  4. Is your CP continuous or coming and going? Coming and going   5. Have you taken Nitroglycerin? no ?

## 2017-06-05 NOTE — Patient Instructions (Addendum)
I have sent a prescription to your pharmacy, to resume repaglinde, just with supper  check your blood sugar once a day.  vary the time of day when you check, between before the 3 meals, and at bedtime.  also check if you have symptoms of your blood sugar being too high or too low.  please keep a record of the readings and bring it to your next appointment here.  You can write it on any piece of paper.  please call us sooner if your blood sugar goes below 70, or if you have a lot of readings over 200.   Please come back for a follow-up appointment in 4 months.

## 2017-06-05 NOTE — Progress Notes (Signed)
Subjective:    Patient ID: Patrick [name], male    DOB: [birth year], 61 y.o.   MRN: [id number]  HPI Pt returns for f/u of diabetes mellitus: DM type: 2 Dx'ed: 2014, when he presented with severe hyperglycemia.   Complications: polyneuropathy, PAD, and renal failure.  Therapy: no medication now.  DKA: never. Severe hypoglycemia: never. Pancreatitis: never. Other: he took insulin 2014-2016; he had renal transplant in 2009, but renal failure has recurred.  He is back on dialysis.  Fructosamine has suggested a1c approx 1% higher than a1c itself.  Interval history: pt states he feels well in general. no cbg record, but states cbg's are well-controlled. Pt says he was dx'ed with PAD, based on Ca++ vessels.  Past Medical History:  Diagnosis Date  . Arthritis    "back" (09/24/2016)  . ESRD (end stage renal disease) on dialysis [facility])    "TTS; [place]" (09/24/2016)  . Gout   . History of blood transfusion 2009- 2016   "I've had several; low HgB"  . History of kidney stones    treated with nephrectomy  . Hypertension   . Impotence of organic origin   . Migraine    "stopped in my 30's; they were related to high BP" (09/24/2016)  . Myocardial infarction [facility]) '96  . Pneumonia 01/2015   " a touch & I was in the hosp."  . Secondary hyperparathyroidism ([place])   . Type 2 diabetes mellitus ([place])     Past Surgical History:  Procedure Laterality Date  . APPENDECTOMY    . CARDIAC CATHETERIZATION  ~ 2016  . 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 HERNIA REPAIR  2017   with nephrectomy  . IR GENERIC HISTORICAL  05/11/2016   IR FLUORO GUIDE CV LINE LEFT 05/11/2016 Corrie Mckusick, DO [facility]  . IR GENERIC HISTORICAL  05/11/2016   IR US GUIDE VASC ACCESS LEFT 05/11/2016 Corrie Mckusick, DO [facility]  . IR GENERIC HISTORICAL  05/11/2016   IR US GUIDE VASC ACCESS RIGHT 05/11/2016 Corrie Mckusick, DO [facility]    . IR GENERIC HISTORICAL  05/11/2016   IR RADIOLOGY PERIPHERAL GUIDED IV START 05/11/2016 Corrie Mckusick, DO [facility]  . KIDNEY TRANSPLANT  2009  . NEPHRECTOMY  2017   transplant rejected     Social History   Socioeconomic History  . Marital status: Married    Spouse name: Not on file  . Number of children: Not on file  . Years of education: Not on file  . Highest education level: Not on file  Social Needs  . Financial resource strain: Not on file  . Food insecurity - worry: Not on file  . Food insecurity - inability: Not on file  . Transportation needs - medical: Not on file  . Transportation needs - non-medical: Not on file  Occupational History  . Not on file  Tobacco Use  . Smoking status: Former Smoker    Types: Cigarettes    Last attempt to quit: 07/04/2014    Years since quitting: 2.9  . Smokeless tobacco: Never Used  . Tobacco comment: "smoked ~ 1 pack/month when I did smoke; never a steady smoker"  Substance and Sexual Activity  . Alcohol use: Yes    Alcohol/week: 0.0 oz    Comment: 09/24/2016 "2 drinks, 1-2 times/year"  . Drug use: No  . Sexual activity: Not Currently    Birth control/protection: None  Other Topics  Concern  . Not on file  Social History Narrative  . Not on file    Current Outpatient Medications on File Prior to Visit  Medication Sig Dispense Refill  . acetaminophen (TYLENOL) 500 MG tablet Take 1,000 mg by mouth every 6 (six) hours as needed for moderate pain or headache.    . albuterol (PROVENTIL HFA;VENTOLIN HFA) 108 (90 Base) MCG/ACT inhaler Inhale 1-2 puffs into the lungs every 6 (six) hours as needed for wheezing or shortness of breath.    Marland Kitchen aspirin EC 81 MG tablet Take 81 mg by mouth daily.    . calcium acetate (PHOSLO) 667 MG capsule Take 2,001 mg by mouth 3 (three) times daily with meals.     . calcium-vitamin D (OSCAL WITH D) 500-200 MG-UNIT tablet Take 1 tablet by mouth 2 (two) times a week.    . carvedilol (COREG) 6.25 MG tablet  Take 1 tablet (6.25 mg total) by mouth 2 (two) times daily. 180 tablet 2  . clopidogrel (PLAVIX) 75 MG tablet Take 1 tablet (75 mg total) by mouth daily. 30 tablet 11  . colchicine 0.6 MG tablet Take 1 tablet (0.6 mg total) by mouth every 14 (fourteen) days. (Patient taking differently: Take 0.6 mg by mouth daily as needed (for gout flare up). )    . isosorbide mononitrate (IMDUR) 60 MG 24 hr tablet Take 1 tablet (60 mg total) by mouth daily. 90 tablet 3   No current facility-administered medications on file prior to visit.     Allergies  Allergen Reactions  . Lipitor [Atorvastatin]     Leg pain  . Metoprolol     Headaches   . Iodinated Diagnostic Agents Rash    Other Reaction: burning to mouth, swelling of lips.    Family History  Problem Relation Age of Onset  . Hyperlipidemia Mother   . Hypertension Mother     BP (!) 82/58   Pulse 90   Wt 226 lb 9.6 oz (102.8 kg)   SpO2 97%   BMI 36.03 kg/m    Review of Systems     Objective:   Physical Exam VITAL SIGNS:  See vs page.  GENERAL: no distress.  Pulses: dorsalis pedis intact bilat.   MSK: no deformity of the feet.  CV: no leg edema.  Skin:  no ulcer on the feet.  normal color and temp on the feet.  Neuro: sensation is intact to touch on the feet, but decreased from normal.  Ext: There is bilateral onychomycosis of the toenails.     Lab Results  Component Value Date   HGBA1C 6.1 06/05/2017       Assessment & Plan:  Type 2 DM, with PAD: worse Renal failure: in this setting, a1c is overestimating glycemic control. Hypotension, asymptomatic, prob due to HD. No rx is needed   Patient Instructions  I have sent a prescription to your pharmacy, to resume repaglinde, just with supper  check your blood sugar once a day.  vary the time of day when you check, between before the 3 meals, and at bedtime.  also check if you have symptoms of your blood sugar being too high or too low.  please keep a record of the readings  and bring it to your next appointment here.  You can write it on any piece of paper.  please call us sooner if your blood sugar goes below 70, or if you have a lot of readings over 200.   Please come back for  a follow-up appointment in 4 months.

## 2017-06-08 DIAGNOSIS — D631 Anemia in chronic kidney disease: Secondary | ICD-10-CM | POA: Diagnosis not present

## 2017-06-08 DIAGNOSIS — E1029 Type 1 diabetes mellitus with other diabetic kidney complication: Secondary | ICD-10-CM | POA: Diagnosis not present

## 2017-06-08 DIAGNOSIS — N2581 Secondary hyperparathyroidism of renal origin: Secondary | ICD-10-CM | POA: Diagnosis not present

## 2017-06-08 DIAGNOSIS — N186 End stage renal disease: Secondary | ICD-10-CM | POA: Diagnosis not present

## 2017-06-09 DIAGNOSIS — D631 Anemia in chronic kidney disease: Secondary | ICD-10-CM | POA: Diagnosis not present

## 2017-06-09 DIAGNOSIS — N2581 Secondary hyperparathyroidism of renal origin: Secondary | ICD-10-CM | POA: Diagnosis not present

## 2017-06-09 DIAGNOSIS — N186 End stage renal disease: Secondary | ICD-10-CM | POA: Diagnosis not present

## 2017-06-09 DIAGNOSIS — E1029 Type 1 diabetes mellitus with other diabetic kidney complication: Secondary | ICD-10-CM | POA: Diagnosis not present

## 2017-06-10 NOTE — Telephone Encounter (Signed)
Advised pt: per Dr. Curt Bears, pt needs to discuss w/ general cardiologist.  Pt understands office will call him to arrange cosult/new pt visit w/ general cardiologist in our office. He is appreciative of our help.

## 2017-06-11 DIAGNOSIS — N186 End stage renal disease: Secondary | ICD-10-CM | POA: Diagnosis not present

## 2017-06-11 DIAGNOSIS — D631 Anemia in chronic kidney disease: Secondary | ICD-10-CM | POA: Diagnosis not present

## 2017-06-11 DIAGNOSIS — E1029 Type 1 diabetes mellitus with other diabetic kidney complication: Secondary | ICD-10-CM | POA: Diagnosis not present

## 2017-06-11 DIAGNOSIS — N2581 Secondary hyperparathyroidism of renal origin: Secondary | ICD-10-CM | POA: Diagnosis not present

## 2017-06-13 DIAGNOSIS — N186 End stage renal disease: Secondary | ICD-10-CM | POA: Diagnosis not present

## 2017-06-13 DIAGNOSIS — E1029 Type 1 diabetes mellitus with other diabetic kidney complication: Secondary | ICD-10-CM | POA: Diagnosis not present

## 2017-06-13 DIAGNOSIS — D631 Anemia in chronic kidney disease: Secondary | ICD-10-CM | POA: Diagnosis not present

## 2017-06-13 DIAGNOSIS — N2581 Secondary hyperparathyroidism of renal origin: Secondary | ICD-10-CM | POA: Diagnosis not present

## 2017-06-15 ENCOUNTER — Telehealth: Payer: Self-pay

## 2017-06-15 DIAGNOSIS — N2581 Secondary hyperparathyroidism of renal origin: Secondary | ICD-10-CM | POA: Diagnosis not present

## 2017-06-15 DIAGNOSIS — D631 Anemia in chronic kidney disease: Secondary | ICD-10-CM | POA: Diagnosis not present

## 2017-06-15 DIAGNOSIS — N186 End stage renal disease: Secondary | ICD-10-CM | POA: Diagnosis not present

## 2017-06-15 DIAGNOSIS — E1029 Type 1 diabetes mellitus with other diabetic kidney complication: Secondary | ICD-10-CM | POA: Diagnosis not present

## 2017-06-17 DIAGNOSIS — N2581 Secondary hyperparathyroidism of renal origin: Secondary | ICD-10-CM | POA: Diagnosis not present

## 2017-06-17 DIAGNOSIS — N186 End stage renal disease: Secondary | ICD-10-CM | POA: Diagnosis not present

## 2017-06-17 DIAGNOSIS — D631 Anemia in chronic kidney disease: Secondary | ICD-10-CM | POA: Diagnosis not present

## 2017-06-17 DIAGNOSIS — E1029 Type 1 diabetes mellitus with other diabetic kidney complication: Secondary | ICD-10-CM | POA: Diagnosis not present

## 2017-06-20 DIAGNOSIS — D631 Anemia in chronic kidney disease: Secondary | ICD-10-CM | POA: Diagnosis not present

## 2017-06-20 DIAGNOSIS — N186 End stage renal disease: Secondary | ICD-10-CM | POA: Diagnosis not present

## 2017-06-20 DIAGNOSIS — N2581 Secondary hyperparathyroidism of renal origin: Secondary | ICD-10-CM | POA: Diagnosis not present

## 2017-06-20 DIAGNOSIS — E1029 Type 1 diabetes mellitus with other diabetic kidney complication: Secondary | ICD-10-CM | POA: Diagnosis not present

## 2017-06-23 DIAGNOSIS — D631 Anemia in chronic kidney disease: Secondary | ICD-10-CM | POA: Diagnosis not present

## 2017-06-23 DIAGNOSIS — N2581 Secondary hyperparathyroidism of renal origin: Secondary | ICD-10-CM | POA: Diagnosis not present

## 2017-06-23 DIAGNOSIS — N186 End stage renal disease: Secondary | ICD-10-CM | POA: Diagnosis not present

## 2017-06-23 DIAGNOSIS — E1029 Type 1 diabetes mellitus with other diabetic kidney complication: Secondary | ICD-10-CM | POA: Diagnosis not present

## 2017-06-25 DIAGNOSIS — N2581 Secondary hyperparathyroidism of renal origin: Secondary | ICD-10-CM | POA: Diagnosis not present

## 2017-06-25 DIAGNOSIS — D631 Anemia in chronic kidney disease: Secondary | ICD-10-CM | POA: Diagnosis not present

## 2017-06-25 DIAGNOSIS — E1029 Type 1 diabetes mellitus with other diabetic kidney complication: Secondary | ICD-10-CM | POA: Diagnosis not present

## 2017-06-25 DIAGNOSIS — N186 End stage renal disease: Secondary | ICD-10-CM | POA: Diagnosis not present

## 2017-06-25 DIAGNOSIS — I4891 Unspecified atrial fibrillation: Secondary | ICD-10-CM | POA: Diagnosis not present

## 2017-06-26 DIAGNOSIS — T8610 Unspecified complication of kidney transplant: Secondary | ICD-10-CM | POA: Diagnosis not present

## 2017-06-26 DIAGNOSIS — N186 End stage renal disease: Secondary | ICD-10-CM | POA: Diagnosis not present

## 2017-06-26 DIAGNOSIS — Z992 Dependence on renal dialysis: Secondary | ICD-10-CM | POA: Diagnosis not present

## 2017-06-27 DIAGNOSIS — E1029 Type 1 diabetes mellitus with other diabetic kidney complication: Secondary | ICD-10-CM | POA: Diagnosis not present

## 2017-06-27 DIAGNOSIS — D631 Anemia in chronic kidney disease: Secondary | ICD-10-CM | POA: Diagnosis not present

## 2017-06-27 DIAGNOSIS — N2581 Secondary hyperparathyroidism of renal origin: Secondary | ICD-10-CM | POA: Diagnosis not present

## 2017-06-27 DIAGNOSIS — N186 End stage renal disease: Secondary | ICD-10-CM | POA: Diagnosis not present

## 2017-06-30 DIAGNOSIS — D631 Anemia in chronic kidney disease: Secondary | ICD-10-CM | POA: Diagnosis not present

## 2017-06-30 DIAGNOSIS — N2581 Secondary hyperparathyroidism of renal origin: Secondary | ICD-10-CM | POA: Diagnosis not present

## 2017-06-30 DIAGNOSIS — N186 End stage renal disease: Secondary | ICD-10-CM | POA: Diagnosis not present

## 2017-06-30 DIAGNOSIS — E1029 Type 1 diabetes mellitus with other diabetic kidney complication: Secondary | ICD-10-CM | POA: Diagnosis not present

## 2017-07-02 DIAGNOSIS — N2581 Secondary hyperparathyroidism of renal origin: Secondary | ICD-10-CM | POA: Diagnosis not present

## 2017-07-02 DIAGNOSIS — D631 Anemia in chronic kidney disease: Secondary | ICD-10-CM | POA: Diagnosis not present

## 2017-07-02 DIAGNOSIS — N186 End stage renal disease: Secondary | ICD-10-CM | POA: Diagnosis not present

## 2017-07-02 DIAGNOSIS — E1029 Type 1 diabetes mellitus with other diabetic kidney complication: Secondary | ICD-10-CM | POA: Diagnosis not present

## 2017-07-04 DIAGNOSIS — N2581 Secondary hyperparathyroidism of renal origin: Secondary | ICD-10-CM | POA: Diagnosis not present

## 2017-07-04 DIAGNOSIS — D631 Anemia in chronic kidney disease: Secondary | ICD-10-CM | POA: Diagnosis not present

## 2017-07-04 DIAGNOSIS — N186 End stage renal disease: Secondary | ICD-10-CM | POA: Diagnosis not present

## 2017-07-04 DIAGNOSIS — E1029 Type 1 diabetes mellitus with other diabetic kidney complication: Secondary | ICD-10-CM | POA: Diagnosis not present

## 2017-07-09 DIAGNOSIS — N186 End stage renal disease: Secondary | ICD-10-CM | POA: Diagnosis not present

## 2017-07-09 DIAGNOSIS — D631 Anemia in chronic kidney disease: Secondary | ICD-10-CM | POA: Diagnosis not present

## 2017-07-09 DIAGNOSIS — N2581 Secondary hyperparathyroidism of renal origin: Secondary | ICD-10-CM | POA: Diagnosis not present

## 2017-07-09 DIAGNOSIS — E1029 Type 1 diabetes mellitus with other diabetic kidney complication: Secondary | ICD-10-CM | POA: Diagnosis not present

## 2017-07-10 ENCOUNTER — Other Ambulatory Visit: Payer: Self-pay

## 2017-07-10 ENCOUNTER — Encounter (HOSPITAL_COMMUNITY): Payer: Self-pay

## 2017-07-10 DIAGNOSIS — R55 Syncope and collapse: Secondary | ICD-10-CM | POA: Diagnosis not present

## 2017-07-10 DIAGNOSIS — Z5321 Procedure and treatment not carried out due to patient leaving prior to being seen by health care provider: Secondary | ICD-10-CM | POA: Insufficient documentation

## 2017-07-10 DIAGNOSIS — R0602 Shortness of breath: Secondary | ICD-10-CM | POA: Diagnosis not present

## 2017-07-10 LAB — CBC
HCT: 32.8 % — ABNORMAL LOW (ref 39.0–52.0)
HEMOGLOBIN: 10.4 g/dL — AB (ref 13.0–17.0)
MCH: 31.7 pg (ref 26.0–34.0)
MCHC: 31.7 g/dL (ref 30.0–36.0)
MCV: 100 fL (ref 78.0–100.0)
PLATELETS: 196 10*3/uL (ref 150–400)
RBC: 3.28 MIL/uL — AB (ref 4.22–5.81)
RDW: 16.3 % — ABNORMAL HIGH (ref 11.5–15.5)
WBC: 7.4 10*3/uL (ref 4.0–10.5)

## 2017-07-10 LAB — BASIC METABOLIC PANEL
Anion gap: 15 (ref 5–15)
BUN: 36 mg/dL — ABNORMAL HIGH (ref 6–20)
CALCIUM: 7.8 mg/dL — AB (ref 8.9–10.3)
CHLORIDE: 93 mmol/L — AB (ref 101–111)
CO2: 27 mmol/L (ref 22–32)
CREATININE: 14.15 mg/dL — AB (ref 0.61–1.24)
GFR calc non Af Amer: 3 mL/min — ABNORMAL LOW (ref >60)
GFR, EST AFRICAN AMERICAN: 4 mL/min — AB (ref >60)
GLUCOSE: 77 mg/dL (ref 65–99)
Potassium: 4.2 mmol/L (ref 3.5–5.1)
Sodium: 135 mmol/L (ref 135–145)

## 2017-07-10 NOTE — ED Triage Notes (Signed)
Per Pt, Pt is coming from home with where he had an episode of syncope while he was coughing. Pt reports sitting in a chair and did not fall to the ground. Reports waking up alert, but he did not remember the actual episode. Denies having any signs that he was going to pass out before. Some chest burning with coughing and some SOB.

## 2017-07-11 ENCOUNTER — Emergency Department (HOSPITAL_COMMUNITY)
Admission: EM | Admit: 2017-07-11 | Discharge: 2017-07-11 | Disposition: A | Discharge status: Home / Self Care | Payer: Medicare Other | Attending: Emergency Medicine | Admitting: Emergency Medicine

## 2017-07-11 DIAGNOSIS — E1029 Type 1 diabetes mellitus with other diabetic kidney complication: Secondary | ICD-10-CM | POA: Diagnosis not present

## 2017-07-11 DIAGNOSIS — N2581 Secondary hyperparathyroidism of renal origin: Secondary | ICD-10-CM | POA: Diagnosis not present

## 2017-07-11 DIAGNOSIS — N186 End stage renal disease: Secondary | ICD-10-CM | POA: Diagnosis not present

## 2017-07-11 DIAGNOSIS — D631 Anemia in chronic kidney disease: Secondary | ICD-10-CM | POA: Diagnosis not present

## 2017-07-11 NOTE — ED Notes (Signed)
Called for vitals no answer x3

## 2017-07-11 NOTE — ED Notes (Signed)
No answer for vitals x2 

## 2017-07-14 DIAGNOSIS — E1029 Type 1 diabetes mellitus with other diabetic kidney complication: Secondary | ICD-10-CM | POA: Diagnosis not present

## 2017-07-14 DIAGNOSIS — N186 End stage renal disease: Secondary | ICD-10-CM | POA: Diagnosis not present

## 2017-07-14 DIAGNOSIS — D631 Anemia in chronic kidney disease: Secondary | ICD-10-CM | POA: Diagnosis not present

## 2017-07-14 DIAGNOSIS — N2581 Secondary hyperparathyroidism of renal origin: Secondary | ICD-10-CM | POA: Diagnosis not present

## 2017-07-16 DIAGNOSIS — N2581 Secondary hyperparathyroidism of renal origin: Secondary | ICD-10-CM | POA: Diagnosis not present

## 2017-07-16 DIAGNOSIS — N186 End stage renal disease: Secondary | ICD-10-CM | POA: Diagnosis not present

## 2017-07-16 DIAGNOSIS — D631 Anemia in chronic kidney disease: Secondary | ICD-10-CM | POA: Diagnosis not present

## 2017-07-16 DIAGNOSIS — E1029 Type 1 diabetes mellitus with other diabetic kidney complication: Secondary | ICD-10-CM | POA: Diagnosis not present

## 2017-07-18 DIAGNOSIS — D631 Anemia in chronic kidney disease: Secondary | ICD-10-CM | POA: Diagnosis not present

## 2017-07-18 DIAGNOSIS — N186 End stage renal disease: Secondary | ICD-10-CM | POA: Diagnosis not present

## 2017-07-18 DIAGNOSIS — N2581 Secondary hyperparathyroidism of renal origin: Secondary | ICD-10-CM | POA: Diagnosis not present

## 2017-07-18 DIAGNOSIS — E1029 Type 1 diabetes mellitus with other diabetic kidney complication: Secondary | ICD-10-CM | POA: Diagnosis not present

## 2017-07-20 DIAGNOSIS — D631 Anemia in chronic kidney disease: Secondary | ICD-10-CM | POA: Diagnosis not present

## 2017-07-20 DIAGNOSIS — E1029 Type 1 diabetes mellitus with other diabetic kidney complication: Secondary | ICD-10-CM | POA: Diagnosis not present

## 2017-07-20 DIAGNOSIS — N186 End stage renal disease: Secondary | ICD-10-CM | POA: Diagnosis not present

## 2017-07-20 DIAGNOSIS — N2581 Secondary hyperparathyroidism of renal origin: Secondary | ICD-10-CM | POA: Diagnosis not present

## 2017-07-23 DIAGNOSIS — N2581 Secondary hyperparathyroidism of renal origin: Secondary | ICD-10-CM | POA: Diagnosis not present

## 2017-07-23 DIAGNOSIS — E1029 Type 1 diabetes mellitus with other diabetic kidney complication: Secondary | ICD-10-CM | POA: Diagnosis not present

## 2017-07-23 DIAGNOSIS — D631 Anemia in chronic kidney disease: Secondary | ICD-10-CM | POA: Diagnosis not present

## 2017-07-23 DIAGNOSIS — I4891 Unspecified atrial fibrillation: Secondary | ICD-10-CM | POA: Diagnosis not present

## 2017-07-23 DIAGNOSIS — N186 End stage renal disease: Secondary | ICD-10-CM | POA: Diagnosis not present

## 2017-07-25 DIAGNOSIS — N186 End stage renal disease: Secondary | ICD-10-CM | POA: Diagnosis not present

## 2017-07-25 DIAGNOSIS — D631 Anemia in chronic kidney disease: Secondary | ICD-10-CM | POA: Diagnosis not present

## 2017-07-25 DIAGNOSIS — E1029 Type 1 diabetes mellitus with other diabetic kidney complication: Secondary | ICD-10-CM | POA: Diagnosis not present

## 2017-07-25 DIAGNOSIS — N2581 Secondary hyperparathyroidism of renal origin: Secondary | ICD-10-CM | POA: Diagnosis not present

## 2017-07-27 DIAGNOSIS — E1029 Type 1 diabetes mellitus with other diabetic kidney complication: Secondary | ICD-10-CM | POA: Diagnosis not present

## 2017-07-27 DIAGNOSIS — T8610 Unspecified complication of kidney transplant: Secondary | ICD-10-CM | POA: Diagnosis not present

## 2017-07-27 DIAGNOSIS — D631 Anemia in chronic kidney disease: Secondary | ICD-10-CM | POA: Diagnosis not present

## 2017-07-27 DIAGNOSIS — Z992 Dependence on renal dialysis: Secondary | ICD-10-CM | POA: Diagnosis not present

## 2017-07-27 DIAGNOSIS — N2581 Secondary hyperparathyroidism of renal origin: Secondary | ICD-10-CM | POA: Diagnosis not present

## 2017-07-27 DIAGNOSIS — N186 End stage renal disease: Secondary | ICD-10-CM | POA: Diagnosis not present

## 2017-07-30 DIAGNOSIS — D631 Anemia in chronic kidney disease: Secondary | ICD-10-CM | POA: Diagnosis not present

## 2017-07-30 DIAGNOSIS — E1029 Type 1 diabetes mellitus with other diabetic kidney complication: Secondary | ICD-10-CM | POA: Diagnosis not present

## 2017-07-30 DIAGNOSIS — N2581 Secondary hyperparathyroidism of renal origin: Secondary | ICD-10-CM | POA: Diagnosis not present

## 2017-07-30 DIAGNOSIS — N186 End stage renal disease: Secondary | ICD-10-CM | POA: Diagnosis not present

## 2017-08-01 DIAGNOSIS — D631 Anemia in chronic kidney disease: Secondary | ICD-10-CM | POA: Diagnosis not present

## 2017-08-01 DIAGNOSIS — N186 End stage renal disease: Secondary | ICD-10-CM | POA: Diagnosis not present

## 2017-08-01 DIAGNOSIS — E1029 Type 1 diabetes mellitus with other diabetic kidney complication: Secondary | ICD-10-CM | POA: Diagnosis not present

## 2017-08-01 DIAGNOSIS — N2581 Secondary hyperparathyroidism of renal origin: Secondary | ICD-10-CM | POA: Diagnosis not present

## 2017-08-04 DIAGNOSIS — E1029 Type 1 diabetes mellitus with other diabetic kidney complication: Secondary | ICD-10-CM | POA: Diagnosis not present

## 2017-08-04 DIAGNOSIS — N2581 Secondary hyperparathyroidism of renal origin: Secondary | ICD-10-CM | POA: Diagnosis not present

## 2017-08-04 DIAGNOSIS — N186 End stage renal disease: Secondary | ICD-10-CM | POA: Diagnosis not present

## 2017-08-04 DIAGNOSIS — D631 Anemia in chronic kidney disease: Secondary | ICD-10-CM | POA: Diagnosis not present

## 2017-08-06 DIAGNOSIS — N186 End stage renal disease: Secondary | ICD-10-CM | POA: Diagnosis not present

## 2017-08-06 DIAGNOSIS — D631 Anemia in chronic kidney disease: Secondary | ICD-10-CM | POA: Diagnosis not present

## 2017-08-06 DIAGNOSIS — E1029 Type 1 diabetes mellitus with other diabetic kidney complication: Secondary | ICD-10-CM | POA: Diagnosis not present

## 2017-08-06 DIAGNOSIS — N2581 Secondary hyperparathyroidism of renal origin: Secondary | ICD-10-CM | POA: Diagnosis not present

## 2017-08-07 ENCOUNTER — Ambulatory Visit (INDEPENDENT_AMBULATORY_CARE_PROVIDER_SITE_OTHER): Payer: Medicare Other | Admitting: Internal Medicine

## 2017-08-07 ENCOUNTER — Encounter: Payer: Self-pay | Admitting: Internal Medicine

## 2017-08-07 ENCOUNTER — Encounter (INDEPENDENT_AMBULATORY_CARE_PROVIDER_SITE_OTHER): Payer: Self-pay

## 2017-08-07 VITALS — BP 129/82 | HR 71 | Ht 66.5 in | Wt 222.2 lb

## 2017-08-07 DIAGNOSIS — Z01812 Encounter for preprocedural laboratory examination: Secondary | ICD-10-CM | POA: Diagnosis not present

## 2017-08-07 DIAGNOSIS — I480 Paroxysmal atrial fibrillation: Secondary | ICD-10-CM

## 2017-08-07 DIAGNOSIS — I10 Essential (primary) hypertension: Secondary | ICD-10-CM | POA: Diagnosis not present

## 2017-08-07 DIAGNOSIS — R079 Chest pain, unspecified: Secondary | ICD-10-CM | POA: Diagnosis not present

## 2017-08-07 NOTE — Patient Instructions (Signed)
Dr. [name] will be calling you with update on plan of care.

## 2017-08-07 NOTE — Progress Notes (Addendum)
Cardiology Office Note   Date:  08/07/2017   ID:  Patrick [name], DOB October 10, 1955, MRN [id number]  PCP:  Nolene Ebbs, MD  Cardiologist:   Dorris Carnes, MD   F/u of atrial flutter   History of Present Illness:Patient is a 62 yo with extensive carediac history .  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  Pt had chest pain and abnormal myovue in 2015 (records not available) Had repeat cath at [facility] by Christen Butter  Had Myovue in 2016 that was low risk  Small regions of basal inferior, apical inferior ischemia  Does get some aches  Most of the time with exertion  Will run down arm Most of the time with heavy exertion  Sometimes wakes up with it Denies SOB with spells  Discomfort will last  about 20 minutes  Eases off on own    Has had for awhile  Pt can have chest pain during intercourse  Doesn't have sex much   Undergoing dialysis   4 hours per run  No change  Open heart surgery in [place]   ? Artery came off wrong way  Says he had another cath in past   but not clear when   Current Meds  Medication Sig  . acetaminophen (TYLENOL) 500 MG tablet Take 1,000 mg by mouth every 6 (six) hours as needed for moderate pain or headache.  . albuterol (PROVENTIL HFA;VENTOLIN HFA) 108 (90 Base) MCG/ACT inhaler Inhale 1-2 puffs into the lungs every 6 (six) hours as needed for wheezing or shortness of breath.  Marland Kitchen aspirin EC 81 MG tablet Take 81 mg by mouth daily.  . calcium acetate (PHOSLO) 667 MG capsule Take 2,001 mg by mouth 3 (three) times daily with meals.   . calcium-vitamin D (OSCAL WITH D) 500-200 MG-UNIT tablet Take 1 tablet by mouth 2 (two) times a week.  . carvedilol (COREG) 6.25 MG tablet Take 1 tablet (6.25 mg total) by mouth 2 (two) times  daily.  . clopidogrel (PLAVIX) 75 MG tablet Take 1 tablet (75 mg total) by mouth daily.  . colchicine 0.6 MG tablet Take 1 tablet (0.6 mg total) by mouth every 14 (fourteen) days. (Patient taking differently: Take 0.6 mg by mouth daily as needed (for gout flare up). )  . repaglinide (PRANDIN) 0.5 MG tablet Take 1 tablet (0.5 mg total) daily with supper by mouth.     Allergies:   Lipitor [atorvastatin]; Metoprolol; and Iodinated diagnostic agents   Past Medical History:  Diagnosis Date  . Arthritis    "back" (09/24/2016)  . ESRD (end stage renal disease) on dialysis [facility])    "TTS; [place]" (09/24/2016)  . Gout   . History of blood transfusion 2009- 2016   "I've had several; low HgB"  . History of kidney stones    treated with nephrectomy  . Hypertension   . Impotence of organic origin   . Migraine    "stopped in my 30's; they were related to high BP" (09/24/2016)  . Myocardial infarction [facility]) '96  . Pneumonia 01/2015   " a touch & I was in the hosp."  . Secondary hyperparathyroidism ([place])   .  Type 2 diabetes mellitus ([place])     Past Surgical History:  Procedure Laterality Date  . A-FLUTTER ABLATION N/A 09/24/2016   Procedure: A-Flutter Ablation;  Surgeon: Will Meredith Leeds, MD;  Location: [place] [facility];  Service: Cardiovascular;  Laterality: N/A;  . ABDOMINAL AORTOGRAM W/LOWER EXTREMITY N/A 04/13/2017   Procedure: ABDOMINAL AORTOGRAM W/LOWER EXTREMITY;  Surgeon: Conrad Goldstream, MD;  Location: [place] [facility];  Service: Cardiovascular;  Laterality: N/A;  Bilater lower extermity  . APPENDECTOMY    . AV FISTULA PLACEMENT Right 09/18/2014   Procedure: INSERTION OF ARTERIOVENOUS (AV) GORE-TEX GRAFT ARM USING 4-7MM  X 45CM STRETCH GORE-TEX VASCULAR GRAFT;  Surgeon: Rosetta Posner, MD;  Location: [place];  Service: Vascular;  Laterality: Right;  . AV FISTULA PLACEMENT Left 07/07/2016   Procedure: INSERTION OF LEFT BRACHIAL TO AXILLARY ARTERIOVENOUS (AV) GORE-TEX ARM GRAFT;   Surgeon: Conrad Butler, MD;  Location: [place];  Service: Vascular;  Laterality: Left;  . CARDIAC CATHETERIZATION  ~ 2016  . 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 HERNIA REPAIR  2017   with nephrectomy  . IR GENERIC HISTORICAL  05/11/2016   IR FLUORO GUIDE CV LINE LEFT 05/11/2016 Corrie Mckusick, DO [facility]  . IR GENERIC HISTORICAL  05/11/2016   IR US GUIDE VASC ACCESS LEFT 05/11/2016 Corrie Mckusick, DO [facility]  . IR GENERIC HISTORICAL  05/11/2016   IR US GUIDE VASC ACCESS RIGHT 05/11/2016 Corrie Mckusick, DO [facility]  . IR GENERIC HISTORICAL  05/11/2016   IR RADIOLOGY PERIPHERAL GUIDED IV START 05/11/2016 Corrie Mckusick, DO [facility]  . KIDNEY TRANSPLANT  2009  . NEPHRECTOMY  2017   transplant rejected   . PERIPHERAL VASCULAR CATHETERIZATION N/A 06/04/2016   Procedure: Upper Extremity Venography;  Surgeon: Waynetta Sandy, MD;  Location: [place] [facility];  Service: Cardiovascular;  Laterality: N/A;  . PERIPHERAL VASCULAR INTERVENTION  04/13/2017   Procedure: PERIPHERAL VASCULAR INTERVENTION;  Surgeon: Conrad Dayville, MD;  Location: [place] [facility];  Service: Cardiovascular;;  Lt. Common/Exernal  Iliac     Social History:  The patient  reports that he quit smoking about 3 years ago. His smoking use included cigarettes. he has never used smokeless tobacco. He reports that he drinks alcohol. He reports that he does not use drugs.   Family History:  The patient's family history includes Hyperlipidemia in his mother; Hypertension in his mother.    ROS:  Please see the history of present illness. All other systems are reviewed and  Negative to the above problem except as noted.    PHYSICAL EXAM: VS:  BP 129/82   Pulse 71   Ht 5' 6.5" (1.689 m)   Wt 222 lb 3.2 oz (100.8 kg)   BMI 35.33 kg/m   GEN: Well nourished, well developed, in no acute distress  HEENT: normal  Neck: no JVD,  carotid bruits, or masses Cardiac: RRR; II/VI systolic murmur  No rubs, or gallops,no edema  Respiratory:  clear to auscultation bilaterally, normal work of breathing GI: soft, nontender, nondistended, + BS  No hepatomegaly  MS: no deformity Moving all extremities   Skin: warm and dry, no rash Neuro:  Strength and sensation are intact Psych: euthymic mood, full affect   EKG:  EKG is ordered today.   Lipid Panel    Component Value Date/Time   CHOL 192 05/25/2016 1542   TRIG 249 (  H) 05/25/2016 1542   HDL 34 (L) 05/25/2016 1542   CHOLHDL 5.6 05/25/2016 1542   VLDL 50 (H) 05/25/2016 1542   LDLCALC 108 (H) 05/25/2016 1542      Wt Readings from Last 3 Encounters:  08/07/17 222 lb 3.2 oz (100.8 kg)  07/10/17 222 lb (100.7 kg)  06/05/17 226 lb 9.6 oz (102.8 kg)      ASSESSMENT AND PLAN:  1  Chest pain  Concerning   Past history of open heart surgery unclear  ? MI in past Need to review, try to get old records   Last eval a couple years ago was noninvasive.  What goes against signif angina in no change in dialysis session  Continues to tolerate.   Will review records   At least a noninvasive eva  2  Rhytm No symtpoms to sugg recurrent flutter   3  ESRD   On dialysis    Current medicines are reviewed at length with the patient today.  The patient does not have concerns regarding medicines.  Signed, Dorris Carnes, MD  08/07/2017 10:31 AM    [address], [place], [place]  [address] Phone: [phone]; Fax: [phone]    08/11/17  After review would set up for [facility]

## 2017-08-08 DIAGNOSIS — E1029 Type 1 diabetes mellitus with other diabetic kidney complication: Secondary | ICD-10-CM | POA: Diagnosis not present

## 2017-08-08 DIAGNOSIS — N2581 Secondary hyperparathyroidism of renal origin: Secondary | ICD-10-CM | POA: Diagnosis not present

## 2017-08-08 DIAGNOSIS — N186 End stage renal disease: Secondary | ICD-10-CM | POA: Diagnosis not present

## 2017-08-08 DIAGNOSIS — D631 Anemia in chronic kidney disease: Secondary | ICD-10-CM | POA: Diagnosis not present

## 2017-08-10 NOTE — Addendum Note (Signed)
Addended by: Fay Records on: 08/10/2017 11:45 PM   Modules accepted: Level of Service

## 2017-08-11 DIAGNOSIS — E1029 Type 1 diabetes mellitus with other diabetic kidney complication: Secondary | ICD-10-CM | POA: Diagnosis not present

## 2017-08-11 DIAGNOSIS — D631 Anemia in chronic kidney disease: Secondary | ICD-10-CM | POA: Diagnosis not present

## 2017-08-11 DIAGNOSIS — N2581 Secondary hyperparathyroidism of renal origin: Secondary | ICD-10-CM | POA: Diagnosis not present

## 2017-08-11 DIAGNOSIS — N186 End stage renal disease: Secondary | ICD-10-CM | POA: Diagnosis not present

## 2017-08-12 ENCOUNTER — Other Ambulatory Visit: Payer: Self-pay | Admitting: *Deleted

## 2017-08-12 DIAGNOSIS — R079 Chest pain, unspecified: Secondary | ICD-10-CM

## 2017-08-12 NOTE — Progress Notes (Signed)
Spoke with patient.  Informed Dr. [name] recommends lexiscan myoview for chest pain.  Pt aware NPO 3 hrs prior except water.  OK to take medications like normal.  Has hemodialysis every Tue, Thurs, Sat..  Order in [place], aware [facility] will call him to schedule.

## 2017-08-12 NOTE — Progress Notes (Signed)
See addendum to OV note Aug 07, 2017.

## 2017-08-12 NOTE — Progress Notes (Signed)
Left message on voicemail for patient to call back. 

## 2017-08-13 DIAGNOSIS — D631 Anemia in chronic kidney disease: Secondary | ICD-10-CM | POA: Diagnosis not present

## 2017-08-13 DIAGNOSIS — N2581 Secondary hyperparathyroidism of renal origin: Secondary | ICD-10-CM | POA: Diagnosis not present

## 2017-08-13 DIAGNOSIS — N186 End stage renal disease: Secondary | ICD-10-CM | POA: Diagnosis not present

## 2017-08-13 DIAGNOSIS — E1029 Type 1 diabetes mellitus with other diabetic kidney complication: Secondary | ICD-10-CM | POA: Diagnosis not present

## 2017-08-15 DIAGNOSIS — N2581 Secondary hyperparathyroidism of renal origin: Secondary | ICD-10-CM | POA: Diagnosis not present

## 2017-08-15 DIAGNOSIS — E1029 Type 1 diabetes mellitus with other diabetic kidney complication: Secondary | ICD-10-CM | POA: Diagnosis not present

## 2017-08-15 DIAGNOSIS — N186 End stage renal disease: Secondary | ICD-10-CM | POA: Diagnosis not present

## 2017-08-15 DIAGNOSIS — D631 Anemia in chronic kidney disease: Secondary | ICD-10-CM | POA: Diagnosis not present

## 2017-08-18 DIAGNOSIS — N2581 Secondary hyperparathyroidism of renal origin: Secondary | ICD-10-CM | POA: Diagnosis not present

## 2017-08-18 DIAGNOSIS — D631 Anemia in chronic kidney disease: Secondary | ICD-10-CM | POA: Diagnosis not present

## 2017-08-18 DIAGNOSIS — E1029 Type 1 diabetes mellitus with other diabetic kidney complication: Secondary | ICD-10-CM | POA: Diagnosis not present

## 2017-08-18 DIAGNOSIS — N186 End stage renal disease: Secondary | ICD-10-CM | POA: Diagnosis not present

## 2017-08-20 DIAGNOSIS — I4891 Unspecified atrial fibrillation: Secondary | ICD-10-CM | POA: Diagnosis not present

## 2017-08-20 DIAGNOSIS — N186 End stage renal disease: Secondary | ICD-10-CM | POA: Diagnosis not present

## 2017-08-20 DIAGNOSIS — N2581 Secondary hyperparathyroidism of renal origin: Secondary | ICD-10-CM | POA: Diagnosis not present

## 2017-08-20 DIAGNOSIS — D631 Anemia in chronic kidney disease: Secondary | ICD-10-CM | POA: Diagnosis not present

## 2017-08-20 DIAGNOSIS — E1029 Type 1 diabetes mellitus with other diabetic kidney complication: Secondary | ICD-10-CM | POA: Diagnosis not present

## 2017-08-22 DIAGNOSIS — D631 Anemia in chronic kidney disease: Secondary | ICD-10-CM | POA: Diagnosis not present

## 2017-08-22 DIAGNOSIS — N2581 Secondary hyperparathyroidism of renal origin: Secondary | ICD-10-CM | POA: Diagnosis not present

## 2017-08-22 DIAGNOSIS — E1029 Type 1 diabetes mellitus with other diabetic kidney complication: Secondary | ICD-10-CM | POA: Diagnosis not present

## 2017-08-22 DIAGNOSIS — N186 End stage renal disease: Secondary | ICD-10-CM | POA: Diagnosis not present

## 2017-08-25 DIAGNOSIS — N2581 Secondary hyperparathyroidism of renal origin: Secondary | ICD-10-CM | POA: Diagnosis not present

## 2017-08-25 DIAGNOSIS — D631 Anemia in chronic kidney disease: Secondary | ICD-10-CM | POA: Diagnosis not present

## 2017-08-25 DIAGNOSIS — N186 End stage renal disease: Secondary | ICD-10-CM | POA: Diagnosis not present

## 2017-08-25 DIAGNOSIS — E1029 Type 1 diabetes mellitus with other diabetic kidney complication: Secondary | ICD-10-CM | POA: Diagnosis not present

## 2017-08-27 DIAGNOSIS — D631 Anemia in chronic kidney disease: Secondary | ICD-10-CM | POA: Diagnosis not present

## 2017-08-27 DIAGNOSIS — T8610 Unspecified complication of kidney transplant: Secondary | ICD-10-CM | POA: Diagnosis not present

## 2017-08-27 DIAGNOSIS — N186 End stage renal disease: Secondary | ICD-10-CM | POA: Diagnosis not present

## 2017-08-27 DIAGNOSIS — E1029 Type 1 diabetes mellitus with other diabetic kidney complication: Secondary | ICD-10-CM | POA: Diagnosis not present

## 2017-08-27 DIAGNOSIS — N2581 Secondary hyperparathyroidism of renal origin: Secondary | ICD-10-CM | POA: Diagnosis not present

## 2017-08-27 DIAGNOSIS — Z992 Dependence on renal dialysis: Secondary | ICD-10-CM | POA: Diagnosis not present

## 2017-08-28 DIAGNOSIS — T8610 Unspecified complication of kidney transplant: Secondary | ICD-10-CM | POA: Diagnosis not present

## 2017-08-28 DIAGNOSIS — Z992 Dependence on renal dialysis: Secondary | ICD-10-CM | POA: Diagnosis not present

## 2017-08-28 DIAGNOSIS — N186 End stage renal disease: Secondary | ICD-10-CM | POA: Diagnosis not present

## 2017-08-29 DIAGNOSIS — Z992 Dependence on renal dialysis: Secondary | ICD-10-CM | POA: Diagnosis not present

## 2017-08-29 DIAGNOSIS — E1029 Type 1 diabetes mellitus with other diabetic kidney complication: Secondary | ICD-10-CM | POA: Diagnosis not present

## 2017-08-29 DIAGNOSIS — N186 End stage renal disease: Secondary | ICD-10-CM | POA: Diagnosis not present

## 2017-08-29 DIAGNOSIS — N2581 Secondary hyperparathyroidism of renal origin: Secondary | ICD-10-CM | POA: Diagnosis not present

## 2017-08-29 DIAGNOSIS — D631 Anemia in chronic kidney disease: Secondary | ICD-10-CM | POA: Diagnosis not present

## 2017-08-31 ENCOUNTER — Telehealth (HOSPITAL_COMMUNITY): Payer: Self-pay | Admitting: *Deleted

## 2017-08-31 NOTE — Telephone Encounter (Signed)
Patient given detailed instructions per Myocardial Perfusion Study Information Sheet for the test on 09/02/17 at 0745. Patient notified to arrive 15 minutes early and that it is imperative to arrive on time for appointment to keep from having the test rescheduled.  If you need to cancel or reschedule your appointment, please call the office within 24 hours of your appointment. . Patient verbalized understanding.Charita Lindenberger, Ranae Palms

## 2017-09-01 DIAGNOSIS — N186 End stage renal disease: Secondary | ICD-10-CM | POA: Diagnosis not present

## 2017-09-01 DIAGNOSIS — Z992 Dependence on renal dialysis: Secondary | ICD-10-CM | POA: Diagnosis not present

## 2017-09-01 DIAGNOSIS — N2581 Secondary hyperparathyroidism of renal origin: Secondary | ICD-10-CM | POA: Diagnosis not present

## 2017-09-01 DIAGNOSIS — E1029 Type 1 diabetes mellitus with other diabetic kidney complication: Secondary | ICD-10-CM | POA: Diagnosis not present

## 2017-09-01 DIAGNOSIS — D631 Anemia in chronic kidney disease: Secondary | ICD-10-CM | POA: Diagnosis not present

## 2017-09-02 ENCOUNTER — Ambulatory Visit (HOSPITAL_COMMUNITY): Payer: Medicare Other | Attending: Cardiology

## 2017-09-02 DIAGNOSIS — E119 Type 2 diabetes mellitus without complications: Secondary | ICD-10-CM | POA: Insufficient documentation

## 2017-09-02 DIAGNOSIS — R9439 Abnormal result of other cardiovascular function study: Secondary | ICD-10-CM | POA: Diagnosis not present

## 2017-09-02 DIAGNOSIS — R079 Chest pain, unspecified: Secondary | ICD-10-CM | POA: Diagnosis not present

## 2017-09-02 DIAGNOSIS — I2510 Atherosclerotic heart disease of native coronary artery without angina pectoris: Secondary | ICD-10-CM | POA: Insufficient documentation

## 2017-09-02 LAB — MYOCARDIAL PERFUSION IMAGING
CHL CUP NUCLEAR SRS: 7
CHL CUP NUCLEAR SSS: 10
CHL CUP RESTING HR STRESS: 70 {beats}/min
LHR: 0.29
LV dias vol: 99 mL (ref 62–150)
LV sys vol: 46 mL
NUC STRESS TID: 1.11
Peak HR: 103 {beats}/min
SDS: 3

## 2017-09-02 MED ORDER — TECHNETIUM TC 99M TETROFOSMIN IV KIT
10.2000 | PACK | Freq: Once | INTRAVENOUS | Status: AC | PRN
  Administered 2017-09-02: 10.2 via INTRAVENOUS
  Filled 2017-09-02: qty 11

## 2017-09-02 MED ORDER — REGADENOSON 0.4 MG/5ML IV SOLN
0.4000 mg | Freq: Once | INTRAVENOUS | Status: AC
  Administered 2017-09-02: 0.4 mg via INTRAVENOUS

## 2017-09-02 MED ORDER — TECHNETIUM TC 99M TETROFOSMIN IV KIT
31.7000 | PACK | Freq: Once | INTRAVENOUS | Status: AC | PRN
  Administered 2017-09-02: 31.7 via INTRAVENOUS
  Filled 2017-09-02: qty 32

## 2017-09-03 DIAGNOSIS — D631 Anemia in chronic kidney disease: Secondary | ICD-10-CM | POA: Diagnosis not present

## 2017-09-03 DIAGNOSIS — Z992 Dependence on renal dialysis: Secondary | ICD-10-CM | POA: Diagnosis not present

## 2017-09-03 DIAGNOSIS — N186 End stage renal disease: Secondary | ICD-10-CM | POA: Diagnosis not present

## 2017-09-03 DIAGNOSIS — E1029 Type 1 diabetes mellitus with other diabetic kidney complication: Secondary | ICD-10-CM | POA: Diagnosis not present

## 2017-09-03 DIAGNOSIS — N2581 Secondary hyperparathyroidism of renal origin: Secondary | ICD-10-CM | POA: Diagnosis not present

## 2017-09-05 DIAGNOSIS — E1029 Type 1 diabetes mellitus with other diabetic kidney complication: Secondary | ICD-10-CM | POA: Diagnosis not present

## 2017-09-05 DIAGNOSIS — N2581 Secondary hyperparathyroidism of renal origin: Secondary | ICD-10-CM | POA: Diagnosis not present

## 2017-09-05 DIAGNOSIS — N186 End stage renal disease: Secondary | ICD-10-CM | POA: Diagnosis not present

## 2017-09-05 DIAGNOSIS — Z992 Dependence on renal dialysis: Secondary | ICD-10-CM | POA: Diagnosis not present

## 2017-09-05 DIAGNOSIS — D631 Anemia in chronic kidney disease: Secondary | ICD-10-CM | POA: Diagnosis not present

## 2017-09-08 DIAGNOSIS — N2581 Secondary hyperparathyroidism of renal origin: Secondary | ICD-10-CM | POA: Diagnosis not present

## 2017-09-08 DIAGNOSIS — E1029 Type 1 diabetes mellitus with other diabetic kidney complication: Secondary | ICD-10-CM | POA: Diagnosis not present

## 2017-09-08 DIAGNOSIS — N186 End stage renal disease: Secondary | ICD-10-CM | POA: Diagnosis not present

## 2017-09-08 DIAGNOSIS — Z992 Dependence on renal dialysis: Secondary | ICD-10-CM | POA: Diagnosis not present

## 2017-09-08 DIAGNOSIS — D631 Anemia in chronic kidney disease: Secondary | ICD-10-CM | POA: Diagnosis not present

## 2017-09-10 DIAGNOSIS — Z992 Dependence on renal dialysis: Secondary | ICD-10-CM | POA: Diagnosis not present

## 2017-09-10 DIAGNOSIS — N186 End stage renal disease: Secondary | ICD-10-CM | POA: Diagnosis not present

## 2017-09-10 DIAGNOSIS — D631 Anemia in chronic kidney disease: Secondary | ICD-10-CM | POA: Diagnosis not present

## 2017-09-10 DIAGNOSIS — E1029 Type 1 diabetes mellitus with other diabetic kidney complication: Secondary | ICD-10-CM | POA: Diagnosis not present

## 2017-09-10 DIAGNOSIS — N2581 Secondary hyperparathyroidism of renal origin: Secondary | ICD-10-CM | POA: Diagnosis not present

## 2017-09-14 ENCOUNTER — Encounter (HOSPITAL_COMMUNITY)
Admission: RE | Disposition: A | Discharge status: Home / Self Care | Payer: Self-pay | Source: Ambulatory Visit | Attending: Vascular Surgery

## 2017-09-14 ENCOUNTER — Ambulatory Visit (HOSPITAL_COMMUNITY): Payer: Medicare Other | Admitting: Certified Registered Nurse Anesthetist

## 2017-09-14 ENCOUNTER — Encounter (HOSPITAL_COMMUNITY): Payer: Self-pay | Admitting: *Deleted

## 2017-09-14 ENCOUNTER — Ambulatory Visit (HOSPITAL_COMMUNITY)
Admission: RE | Admit: 2017-09-14 | Discharge: 2017-09-14 | Disposition: A | Discharge status: Home / Self Care | Payer: Medicare Other | Source: Ambulatory Visit | Attending: Vascular Surgery | Admitting: Vascular Surgery

## 2017-09-14 ENCOUNTER — Other Ambulatory Visit: Payer: Self-pay | Admitting: *Deleted

## 2017-09-14 DIAGNOSIS — Z91041 Radiographic dye allergy status: Secondary | ICD-10-CM | POA: Insufficient documentation

## 2017-09-14 DIAGNOSIS — M4690 Unspecified inflammatory spondylopathy, site unspecified: Secondary | ICD-10-CM | POA: Insufficient documentation

## 2017-09-14 DIAGNOSIS — Z905 Acquired absence of kidney: Secondary | ICD-10-CM | POA: Diagnosis not present

## 2017-09-14 DIAGNOSIS — I739 Peripheral vascular disease, unspecified: Secondary | ICD-10-CM | POA: Diagnosis not present

## 2017-09-14 DIAGNOSIS — M1990 Unspecified osteoarthritis, unspecified site: Secondary | ICD-10-CM | POA: Diagnosis not present

## 2017-09-14 DIAGNOSIS — E1122 Type 2 diabetes mellitus with diabetic chronic kidney disease: Secondary | ICD-10-CM | POA: Insufficient documentation

## 2017-09-14 DIAGNOSIS — X58XXXA Exposure to other specified factors, initial encounter: Secondary | ICD-10-CM | POA: Insufficient documentation

## 2017-09-14 DIAGNOSIS — D759 Disease of blood and blood-forming organs, unspecified: Secondary | ICD-10-CM | POA: Insufficient documentation

## 2017-09-14 DIAGNOSIS — Z8249 Family history of ischemic heart disease and other diseases of the circulatory system: Secondary | ICD-10-CM | POA: Insufficient documentation

## 2017-09-14 DIAGNOSIS — T82858A Stenosis of vascular prosthetic devices, implants and grafts, initial encounter: Secondary | ICD-10-CM | POA: Insufficient documentation

## 2017-09-14 DIAGNOSIS — Z992 Dependence on renal dialysis: Secondary | ICD-10-CM | POA: Diagnosis not present

## 2017-09-14 DIAGNOSIS — I252 Old myocardial infarction: Secondary | ICD-10-CM | POA: Insufficient documentation

## 2017-09-14 DIAGNOSIS — J449 Chronic obstructive pulmonary disease, unspecified: Secondary | ICD-10-CM | POA: Diagnosis not present

## 2017-09-14 DIAGNOSIS — Z8669 Personal history of other diseases of the nervous system and sense organs: Secondary | ICD-10-CM | POA: Insufficient documentation

## 2017-09-14 DIAGNOSIS — Z955 Presence of coronary angioplasty implant and graft: Secondary | ICD-10-CM | POA: Insufficient documentation

## 2017-09-14 DIAGNOSIS — Z87891 Personal history of nicotine dependence: Secondary | ICD-10-CM | POA: Diagnosis not present

## 2017-09-14 DIAGNOSIS — Z87442 Personal history of urinary calculi: Secondary | ICD-10-CM | POA: Insufficient documentation

## 2017-09-14 DIAGNOSIS — Z888 Allergy status to other drugs, medicaments and biological substances status: Secondary | ICD-10-CM | POA: Diagnosis not present

## 2017-09-14 DIAGNOSIS — I2510 Atherosclerotic heart disease of native coronary artery without angina pectoris: Secondary | ICD-10-CM | POA: Insufficient documentation

## 2017-09-14 DIAGNOSIS — M109 Gout, unspecified: Secondary | ICD-10-CM | POA: Diagnosis not present

## 2017-09-14 DIAGNOSIS — I120 Hypertensive chronic kidney disease with stage 5 chronic kidney disease or end stage renal disease: Secondary | ICD-10-CM | POA: Diagnosis not present

## 2017-09-14 DIAGNOSIS — N186 End stage renal disease: Secondary | ICD-10-CM | POA: Diagnosis not present

## 2017-09-14 DIAGNOSIS — T82868A Thrombosis of vascular prosthetic devices, implants and grafts, initial encounter: Secondary | ICD-10-CM | POA: Diagnosis not present

## 2017-09-14 DIAGNOSIS — N2581 Secondary hyperparathyroidism of renal origin: Secondary | ICD-10-CM | POA: Diagnosis not present

## 2017-09-14 DIAGNOSIS — I132 Hypertensive heart and chronic kidney disease with heart failure and with stage 5 chronic kidney disease, or end stage renal disease: Secondary | ICD-10-CM | POA: Diagnosis not present

## 2017-09-14 DIAGNOSIS — Z951 Presence of aortocoronary bypass graft: Secondary | ICD-10-CM | POA: Insufficient documentation

## 2017-09-14 DIAGNOSIS — Z6836 Body mass index (BMI) 36.0-36.9, adult: Secondary | ICD-10-CM | POA: Diagnosis not present

## 2017-09-14 DIAGNOSIS — I5021 Acute systolic (congestive) heart failure: Secondary | ICD-10-CM | POA: Diagnosis not present

## 2017-09-14 HISTORY — PX: THROMBECTOMY W/ EMBOLECTOMY: SHX2507

## 2017-09-14 LAB — POCT I-STAT 4, (NA,K, GLUC, HGB,HCT)
Glucose, Bld: 97 mg/dL (ref 65–99)
HCT: 28 % — ABNORMAL LOW (ref 39.0–52.0)
HEMOGLOBIN: 9.5 g/dL — AB (ref 13.0–17.0)
Potassium: 4.5 mmol/L (ref 3.5–5.1)
Sodium: 137 mmol/L (ref 135–145)

## 2017-09-14 LAB — GLUCOSE, CAPILLARY: GLUCOSE-CAPILLARY: 79 mg/dL (ref 65–99)

## 2017-09-14 SURGERY — THROMBECTOMY ARTERIOVENOUS GORE-TEX GRAFT
Anesthesia: Monitor Anesthesia Care | Site: Arm Upper

## 2017-09-14 MED ORDER — PROPOFOL 500 MG/50ML IV EMUL
INTRAVENOUS | Status: DC | PRN
  Administered 2017-09-14: 50 ug/kg/min via INTRAVENOUS

## 2017-09-14 MED ORDER — ONDANSETRON HCL 4 MG/2ML IJ SOLN
INTRAMUSCULAR | Status: DC | PRN
  Administered 2017-09-14: 4 mg via INTRAVENOUS

## 2017-09-14 MED ORDER — PHENYLEPHRINE HCL 10 MG/ML IJ SOLN
INTRAMUSCULAR | Status: DC | PRN
  Administered 2017-09-14 (×2): 80 ug via INTRAVENOUS

## 2017-09-14 MED ORDER — MIDAZOLAM HCL 2 MG/2ML IJ SOLN
INTRAMUSCULAR | Status: AC
  Filled 2017-09-14: qty 2

## 2017-09-14 MED ORDER — PROMETHAZINE HCL 25 MG/ML IJ SOLN: 6.2500 mg | INTRAMUSCULAR | Status: DC | PRN

## 2017-09-14 MED ORDER — 0.9 % SODIUM CHLORIDE (POUR BTL) OPTIME
TOPICAL | Status: DC | PRN
  Administered 2017-09-14: 1000 mL

## 2017-09-14 MED ORDER — TRAMADOL HCL 50 MG PO TABS: 50.0000 mg | ORAL_TABLET | Freq: Four times a day (QID) | ORAL | 0 refills | Status: DC | PRN

## 2017-09-14 MED ORDER — FENTANYL CITRATE (PF) 100 MCG/2ML IJ SOLN
INTRAMUSCULAR | Status: DC | PRN
  Administered 2017-09-14: 50 ug via INTRAVENOUS
  Administered 2017-09-14: 25 ug via INTRAVENOUS
  Administered 2017-09-14: 50 ug via INTRAVENOUS

## 2017-09-14 MED ORDER — LIDOCAINE-EPINEPHRINE 0.5 %-1:200000 IJ SOLN
INTRAMUSCULAR | Status: DC | PRN
  Administered 2017-09-14: 10 mL

## 2017-09-14 MED ORDER — SODIUM CHLORIDE 0.9 % IV SOLN
INTRAVENOUS | Status: DC
  Administered 2017-09-14: 14:00:00 via INTRAVENOUS

## 2017-09-14 MED ORDER — SODIUM CHLORIDE 0.9 % IV SOLN
1.5000 g | INTRAVENOUS | Status: AC
  Administered 2017-09-14: 1.5 g via INTRAVENOUS
  Filled 2017-09-14: qty 1.5

## 2017-09-14 MED ORDER — FENTANYL CITRATE (PF) 250 MCG/5ML IJ SOLN
INTRAMUSCULAR | Status: AC
  Filled 2017-09-14: qty 5

## 2017-09-14 MED ORDER — HEPARIN SODIUM (PORCINE) 1000 UNIT/ML IJ SOLN
INTRAMUSCULAR | Status: AC
  Filled 2017-09-14: qty 1

## 2017-09-14 MED ORDER — LIDOCAINE-EPINEPHRINE 0.5 %-1:200000 IJ SOLN
INTRAMUSCULAR | Status: AC
  Filled 2017-09-14: qty 1

## 2017-09-14 MED ORDER — MEPERIDINE HCL 50 MG/ML IJ SOLN: 6.2500 mg | INTRAMUSCULAR | Status: DC | PRN

## 2017-09-14 MED ORDER — FENTANYL CITRATE (PF) 100 MCG/2ML IJ SOLN: 25.0000 ug | INTRAMUSCULAR | Status: DC | PRN

## 2017-09-14 MED ORDER — HEPARIN SODIUM (PORCINE) 5000 UNIT/ML IJ SOLN
INTRAMUSCULAR | Status: DC | PRN
  Administered 2017-09-14: 14:00:00 500 mL

## 2017-09-14 MED ORDER — MIDAZOLAM HCL 5 MG/5ML IJ SOLN
INTRAMUSCULAR | Status: DC | PRN
  Administered 2017-09-14: 2 mg via INTRAVENOUS

## 2017-09-14 MED ORDER — PROPOFOL 10 MG/ML IV BOLUS
INTRAVENOUS | Status: DC | PRN
  Administered 2017-09-14: 30 mg via INTRAVENOUS

## 2017-09-14 MED ORDER — MIDAZOLAM HCL 2 MG/2ML IJ SOLN: 0.5000 mg | Freq: Once | INTRAMUSCULAR | Status: DC | PRN

## 2017-09-14 SURGICAL SUPPLY — 51 items
ARMBAND PINK RESTRICT EXTREMIT (MISCELLANEOUS) ×4 IMPLANT
BAG DECANTER FOR FLEXI CONT (MISCELLANEOUS) ×4 IMPLANT
BIOPATCH RED 1 DISK 7.0 (GAUZE/BANDAGES/DRESSINGS) ×3 IMPLANT
BIOPATCH RED 1IN DISK 7.0MM (GAUZE/BANDAGES/DRESSINGS) ×1
CANISTER SUCT 3000ML PPV (MISCELLANEOUS) ×4 IMPLANT
CANNULA VESSEL 3MM 2 BLNT TIP (CANNULA) ×4 IMPLANT
CATH EMB 4FR 40CM (CATHETERS) ×4 IMPLANT
CATH EMB 4FR 80CM (CATHETERS) ×4 IMPLANT
CATH PALINDROME RT-P 15FX19CM (CATHETERS) IMPLANT
CATH PALINDROME RT-P 15FX23CM (CATHETERS) IMPLANT
CATH PALINDROME RT-P 15FX28CM (CATHETERS) IMPLANT
CATH PALINDROME RT-P 15FX55CM (CATHETERS) IMPLANT
CLIP LIGATING EXTRA MED SLVR (CLIP) ×4 IMPLANT
CLIP LIGATING EXTRA SM BLUE (MISCELLANEOUS) ×4 IMPLANT
COVER PROBE W GEL 5X96 (DRAPES) IMPLANT
COVER SURGICAL LIGHT HANDLE (MISCELLANEOUS) ×4 IMPLANT
DECANTER SPIKE VIAL GLASS SM (MISCELLANEOUS) ×4 IMPLANT
DERMABOND ADVANCED (GAUZE/BANDAGES/DRESSINGS) ×2
DERMABOND ADVANCED .7 DNX12 (GAUZE/BANDAGES/DRESSINGS) ×2 IMPLANT
DRAPE C-ARM 42X72 X-RAY (DRAPES) IMPLANT
DRAPE CHEST BREAST 15X10 FENES (DRAPES) ×4 IMPLANT
ELECT REM PT RETURN 9FT ADLT (ELECTROSURGICAL) ×4
ELECTRODE REM PT RTRN 9FT ADLT (ELECTROSURGICAL) ×2 IMPLANT
GLOVE SS BIOGEL STRL SZ 7.5 (GLOVE) ×2 IMPLANT
GLOVE SUPERSENSE BIOGEL SZ 7.5 (GLOVE) ×2
GOWN STRL REUS W/ TWL LRG LVL3 (GOWN DISPOSABLE) ×6 IMPLANT
GOWN STRL REUS W/TWL LRG LVL3 (GOWN DISPOSABLE) ×6
KIT BASIN OR (CUSTOM PROCEDURE TRAY) ×4 IMPLANT
KIT ROOM TURNOVER OR (KITS) ×4 IMPLANT
NEEDLE 18GX1X1/2 (RX/OR ONLY) (NEEDLE) IMPLANT
NEEDLE 22X1 1/2 (OR ONLY) (NEEDLE) IMPLANT
NEEDLE HYPO 25GX1X1/2 BEV (NEEDLE) ×4 IMPLANT
NS IRRIG 1000ML POUR BTL (IV SOLUTION) ×4 IMPLANT
PACK CV ACCESS (CUSTOM PROCEDURE TRAY) ×4 IMPLANT
PACK SURGICAL SETUP 50X90 (CUSTOM PROCEDURE TRAY) IMPLANT
PAD ARMBOARD 7.5X6 YLW CONV (MISCELLANEOUS) ×8 IMPLANT
SOAP 2 % CHG 4 OZ (WOUND CARE) IMPLANT
SUT ETHILON 3 0 PS 1 (SUTURE) ×4 IMPLANT
SUT PROLENE 5 0 C 1 24 (SUTURE) ×4 IMPLANT
SUT PROLENE 6 0 CC (SUTURE) ×4 IMPLANT
SUT VIC AB 3-0 SH 27 (SUTURE) ×2
SUT VIC AB 3-0 SH 27X BRD (SUTURE) ×2 IMPLANT
SUT VICRYL 4-0 PS2 18IN ABS (SUTURE) ×4 IMPLANT
SYR 10ML LL (SYRINGE) IMPLANT
SYR 20CC LL (SYRINGE) IMPLANT
SYR 5ML LL (SYRINGE) ×4 IMPLANT
SYR CONTROL 10ML LL (SYRINGE) ×4 IMPLANT
TOWEL GREEN STERILE (TOWEL DISPOSABLE) ×8 IMPLANT
TOWEL GREEN STERILE FF (TOWEL DISPOSABLE) ×4 IMPLANT
UNDERPAD 30X30 (UNDERPADS AND DIAPERS) ×4 IMPLANT
WATER STERILE IRR 1000ML POUR (IV SOLUTION) ×4 IMPLANT

## 2017-09-14 NOTE — Op Note (Signed)
    OPERATIVE REPORT  DATE OF SURGERY: 09/14/2017  PATIENT: Patrick [name], 62 y.o. male MRN: [id number]  DOB: 02/26/1956  PRE-OPERATIVE DIAGNOSIS: End-stage renal disease with occluded left upper arm AV Gore-Tex graft  POST-OPERATIVE DIAGNOSIS:  Same  PROCEDURE: Thrombectomy of left upper arm AV Gore-Tex graft  SURGEON:  Curt Jews, M.D.  PHYSICIAN ASSISTANT: Nurse  ANESTHESIA: Local with sedation  EBL: Minimal ml  Total I/O In: 400 [I.V.:400] Out: 150 [Blood:150]  BLOOD ADMINISTERED: None  DRAINS: None  SPECIMEN: None  COUNTS CORRECT:  YES  PLAN OF CARE: PACU  PATIENT DISPOSITION:  PACU - hemodynamically stable  PROCEDURE DETAILS: The patient was taken to the operating room placed in supine position where the area of the left arm was prepped and draped in the usual sterile fashion.  He had a left upper arm AV Gore-Tex graft placed in December 2017.  He has had lysis of this in the past at an outlying center and I do not have his prior records.  He apparently has had a severe reaction despite pretreatment and therefore was felt not to be a candidate for endovascular treatment of his occluded graft.  He is sent to [place] for surgical thrombectomy.  The incision was made high in the axilla proximal to the prior incision.  This was carried down to isolate the Gore-Tex graft to vein anastomosis.  There was a stent in the vein that extended from the Gore-Tex graft as high as could be palpated in the axilla.  It was not possible to get proximal to the stent in the axillary vein.  The covered stent was opened transversely with heavy scissors.  The Gore-Tex graft and arterial anastomosis were thrombectomized with a 4 Fogarty catheter.  The arterialized plug was removed and excellent inflow was encountered.  The graft was flushed with heparinized saline and reoccluded.  Next the Fogarty catheter was passed through the venous anastomosis without demise as well.  There was a high venous  pressure with a great deal of venous back filling.  It was flushed with heparinized saline and reoccluded as well.  The incision the graft was closed with a running 5-0 Prolene suture.  Clamps were removed and a good flow was noted in the graft.  The wounds were irrigated with saline.  Hemostasis was obtained with electrocautery.  The wounds were closed with 3-0 Vicryl in the subcutaneous and subcuticular tissue.  Sterile dressing was applied   Rosetta Posner, M.D., [facility] 09/14/2017 3:14 PM

## 2017-09-14 NOTE — Anesthesia Preprocedure Evaluation (Addendum)
Anesthesia Evaluation  Patient identified by MRN, date of birth, ID band Patient awake    Reviewed: Allergy & Precautions, NPO status , Patient's Chart, lab work & pertinent test results  History of Anesthesia Complications Negative for: history of anesthetic complications  Airway Mallampati: II  TM Distance: >3 FB     Dental  (+) Dental Advisory Given   Pulmonary COPD,  COPD inhaler, former smoker (quit 2015),    breath sounds clear to auscultation       Cardiovascular hypertension, Pt. on medications and Pt. on home beta blockers (-) angina+ CAD, + Cardiac Stents, + CABG and + Peripheral Vascular Disease   Rhythm:Regular Rate:Normal  09/07/17 Stress test: Stress test showed area of scar but no ischemia   Pumping function is near normal   Neuro/Psych  Headaches,    GI/Hepatic negative GI ROS, Neg liver ROS,   Endo/Other  diabetes (glu 97)Morbid obesity  Renal/GU Dialysis and ESRFRenal disease (no dialysis since Thursday, K+ 4.5)     Musculoskeletal  (+) Arthritis ,   Abdominal (+) + obese,   Peds  Hematology  (+) Blood dyscrasia (Hb 9.5), ,   Anesthesia Other Findings   Reproductive/Obstetrics                           Anesthesia Physical Anesthesia Plan  ASA: III  Anesthesia Plan: MAC   Post-op Pain Management:    Induction: Intravenous  PONV Risk Score and Plan: 1 and Treatment may vary due to age or medical condition and Ondansetron  Airway Management Planned: Natural Airway and Simple Face Mask  Additional Equipment:   Intra-op Plan:   Post-operative Plan:   Informed Consent: I have reviewed the patients History and Physical, chart, labs and discussed the procedure including the risks, benefits and alternatives for the proposed anesthesia with the patient or authorized representative who has indicated his/her understanding and acceptance.   Dental advisory given  Plan  Discussed with: CRNA and Surgeon  Anesthesia Plan Comments: (Plan routine monitors, MAC)        Anesthesia Quick Evaluation

## 2017-09-14 NOTE — Anesthesia Postprocedure Evaluation (Signed)
Anesthesia Post Note  Patient: Patrick [name]  Procedure(s) Performed: THROMBECTOMY ARTERIOVENOUS GORE-TEX GRAFT LEFT UPPER ARM (Left Arm Upper)     Patient location during evaluation: PACU Anesthesia Type: MAC Level of consciousness: awake and alert, patient cooperative and oriented Pain management: pain level controlled Vital Signs Assessment: post-procedure vital signs reviewed and stable Respiratory status: spontaneous breathing, nonlabored ventilation and respiratory function stable Cardiovascular status: blood pressure returned to baseline and stable Postop Assessment: no apparent nausea or vomiting Anesthetic complications: no    Last Vitals:  Vitals:   09/14/17 1545 09/14/17 1615  BP: 110/60 108/65  Pulse:    Resp: 16 17  Temp:    SpO2: 100% 99%    Last Pain:  Vitals:   09/14/17 1213  TempSrc: Oral                 Eldora Napp,E. Jodie Cavey

## 2017-09-14 NOTE — H&P (Signed)
Vascular and Vein Specialist of [facility]  Patient name: Patrick [name] MRN: [id number] DOB: 1956/05/20 Sex: male    HPI: Patrick [name] is a 62 y.o. male presenting today with an occluded left upper arm AV Gore-Tex graft.  Was placed in December 2017 and he has had good function of this.  He has a severe contrast allergy and had severe reaction despite pretreatment and therefore is not felt to be a candidate for endovascular treatment of his occluded graft.  That this was into into his high brachial vein.  He had successful dialysis last Thursday and presented on Saturday with an occluded graft.  Some reason we were not contacted until today to address his graft occlusion.  His lab is pending.  He is on Plavix but is not on anticoagulation.  Past Medical History:  Diagnosis Date  . Arthritis    "back" (09/24/2016)  . ESRD (end stage renal disease) on dialysis [facility])    "TTS; [place]" (09/24/2016)  . Gout   . History of blood transfusion 2009- 2016   "I've had several; low HgB"  . History of kidney stones    treated with nephrectomy  . Hypertension   . Impotence of organic origin   . Migraine    "stopped in my 30's; they were related to high BP" (09/24/2016)  . Myocardial infarction [facility]) '96  . Pneumonia 01/2015   " a touch & I was in the hosp."  . Secondary hyperparathyroidism ([place])   . Type 2 diabetes mellitus ([facility])     Family History  Problem Relation Age of Onset  . Hyperlipidemia Mother   . Hypertension Mother     SOCIAL HISTORY: Social History   Tobacco Use  . Smoking status: Former Smoker    Types: Cigarettes    Last attempt to quit: 07/04/2014    Years since quitting: 3.2  . Smokeless tobacco: Never Used  . Tobacco comment: "smoked ~ 1 pack/month when I did smoke; never a steady smoker"  Substance Use Topics  . Alcohol use: Yes    Alcohol/week: 0.0 oz    Comment: 09/24/2016 "2 drinks, 1-2 times/year"    Allergies    Allergen Reactions  . Lipitor [Atorvastatin]     Leg pain  . Metoprolol     Headaches   . Iodinated Diagnostic Agents Rash    Other Reaction: burning to mouth, swelling of lips.    Current Facility-Administered Medications  Medication Dose Route Frequency Provider Last Rate Last Dose  . 0.9 %  sodium chloride infusion   Intravenous Continuous Early, Arvilla Meres, MD      . cefUROXime (ZINACEF) 1.5 g in sodium chloride 0.9 % 100 mL IVPB  1.5 g Intravenous 30 min Pre-Op Early, Arvilla Meres, MD        REVIEW OF SYSTEMS:  [X]  denotes positive finding, [ ]  denotes negative finding Cardiac  Comments:  Chest pain or chest pressure:    Shortness of breath upon exertion:    Short of breath when lying flat:    Irregular heart rhythm:        Vascular    Pain in calf, thigh, or hip brought on by ambulation:    Pain in feet at night that wakes you up from your sleep:     Blood clot in your veins:    Leg swelling:           PHYSICAL EXAM: Vitals:   09/14/17 1213 09/14/17 1214  BP:  Marland Kitchen)  156/71  Pulse: 63   Resp: 18   Temp: 97.6 F (36.4 C)   TempSrc: Oral   SpO2: 100%   Weight: 224 lb (101.6 kg)   Height: 5\' 6"  (1.676 m)     GENERAL: The patient is a well-nourished male, in no acute distress. The vital signs are documented above. CARDIOVASCULAR: Left upper arm graft with no thrill or bruit PULMONARY: There is good air exchange  MUSCULOSKELETAL: There are no major deformities or cyanosis. NEUROLOGIC: No focal weakness or paresthesias are detected. SKIN: There are no ulcers or rashes noted. PSYCHIATRIC: The patient has a normal affect.  DATA:  Lab pending  MEDICAL ISSUES: Occluded left upper arm AV Gore-Tex graft.  Will take to the operating room for thrombectomy and possible revision.  Also discussed the possibility of placement of tunneled hemodialysis catheter.  He questions where his next option would opening of his left upper arm graft.  I explained that this would have to be  reassessed all likelihood would require a leg graft placement.    Rosetta Posner, MD FACS [facility] Tel [phone] Pager [phone]

## 2017-09-14 NOTE — Transfer of Care (Signed)
Immediate Anesthesia Transfer of Care Note  Patient: Patrick [name]  Procedure(s) Performed: THROMBECTOMY ARTERIOVENOUS GORE-TEX GRAFT LEFT UPPER ARM (Left Arm Upper)  Patient Location: PACU  Anesthesia Type:MAC  Level of Consciousness: awake, alert  and oriented  Airway & Oxygen Therapy: Patient Spontanous Breathing  Post-op Assessment: Report given to RN, Post -op Vital signs reviewed and stable and Patient moving all extremities X 4  Post vital signs: Reviewed and stable  Last Vitals:  Vitals:   09/14/17 1214 09/14/17 1445  BP: (!) 156/71 123/60  Pulse:  73  Resp:  (!) 23  Temp:    SpO2:  100%    Last Pain:  Vitals:   09/14/17 1213  TempSrc: Oral      Patients Stated Pain Goal: 1 (92/44/62 8638)  Complications: No apparent anesthesia complications

## 2017-09-14 NOTE — Progress Notes (Signed)
No pre-medications needed per Dr. Donnetta Hutching

## 2017-09-15 ENCOUNTER — Encounter (HOSPITAL_COMMUNITY): Payer: Self-pay | Admitting: Vascular Surgery

## 2017-09-15 DIAGNOSIS — N186 End stage renal disease: Secondary | ICD-10-CM | POA: Diagnosis not present

## 2017-09-15 DIAGNOSIS — E1029 Type 1 diabetes mellitus with other diabetic kidney complication: Secondary | ICD-10-CM | POA: Diagnosis not present

## 2017-09-15 DIAGNOSIS — Z992 Dependence on renal dialysis: Secondary | ICD-10-CM | POA: Diagnosis not present

## 2017-09-15 DIAGNOSIS — N2581 Secondary hyperparathyroidism of renal origin: Secondary | ICD-10-CM | POA: Diagnosis not present

## 2017-09-15 DIAGNOSIS — D631 Anemia in chronic kidney disease: Secondary | ICD-10-CM | POA: Diagnosis not present

## 2017-09-17 DIAGNOSIS — E1029 Type 1 diabetes mellitus with other diabetic kidney complication: Secondary | ICD-10-CM | POA: Diagnosis not present

## 2017-09-17 DIAGNOSIS — D631 Anemia in chronic kidney disease: Secondary | ICD-10-CM | POA: Diagnosis not present

## 2017-09-17 DIAGNOSIS — Z992 Dependence on renal dialysis: Secondary | ICD-10-CM | POA: Diagnosis not present

## 2017-09-17 DIAGNOSIS — N2581 Secondary hyperparathyroidism of renal origin: Secondary | ICD-10-CM | POA: Diagnosis not present

## 2017-09-17 DIAGNOSIS — N186 End stage renal disease: Secondary | ICD-10-CM | POA: Diagnosis not present

## 2017-09-19 DIAGNOSIS — N186 End stage renal disease: Secondary | ICD-10-CM | POA: Diagnosis not present

## 2017-09-19 DIAGNOSIS — D631 Anemia in chronic kidney disease: Secondary | ICD-10-CM | POA: Diagnosis not present

## 2017-09-19 DIAGNOSIS — N2581 Secondary hyperparathyroidism of renal origin: Secondary | ICD-10-CM | POA: Diagnosis not present

## 2017-09-19 DIAGNOSIS — Z992 Dependence on renal dialysis: Secondary | ICD-10-CM | POA: Diagnosis not present

## 2017-09-19 DIAGNOSIS — E1029 Type 1 diabetes mellitus with other diabetic kidney complication: Secondary | ICD-10-CM | POA: Diagnosis not present

## 2017-09-22 DIAGNOSIS — N186 End stage renal disease: Secondary | ICD-10-CM | POA: Diagnosis not present

## 2017-09-22 DIAGNOSIS — Z992 Dependence on renal dialysis: Secondary | ICD-10-CM | POA: Diagnosis not present

## 2017-09-22 DIAGNOSIS — E1029 Type 1 diabetes mellitus with other diabetic kidney complication: Secondary | ICD-10-CM | POA: Diagnosis not present

## 2017-09-22 DIAGNOSIS — D631 Anemia in chronic kidney disease: Secondary | ICD-10-CM | POA: Diagnosis not present

## 2017-09-22 DIAGNOSIS — N2581 Secondary hyperparathyroidism of renal origin: Secondary | ICD-10-CM | POA: Diagnosis not present

## 2017-09-24 DIAGNOSIS — N2581 Secondary hyperparathyroidism of renal origin: Secondary | ICD-10-CM | POA: Diagnosis not present

## 2017-09-24 DIAGNOSIS — Z992 Dependence on renal dialysis: Secondary | ICD-10-CM | POA: Diagnosis not present

## 2017-09-24 DIAGNOSIS — I4891 Unspecified atrial fibrillation: Secondary | ICD-10-CM | POA: Diagnosis not present

## 2017-09-24 DIAGNOSIS — D631 Anemia in chronic kidney disease: Secondary | ICD-10-CM | POA: Diagnosis not present

## 2017-09-24 DIAGNOSIS — E1029 Type 1 diabetes mellitus with other diabetic kidney complication: Secondary | ICD-10-CM | POA: Diagnosis not present

## 2017-09-24 DIAGNOSIS — N186 End stage renal disease: Secondary | ICD-10-CM | POA: Diagnosis not present

## 2017-09-25 DIAGNOSIS — Z992 Dependence on renal dialysis: Secondary | ICD-10-CM | POA: Diagnosis not present

## 2017-09-25 DIAGNOSIS — N186 End stage renal disease: Secondary | ICD-10-CM | POA: Diagnosis not present

## 2017-09-25 DIAGNOSIS — T8610 Unspecified complication of kidney transplant: Secondary | ICD-10-CM | POA: Diagnosis not present

## 2017-09-26 DIAGNOSIS — Z992 Dependence on renal dialysis: Secondary | ICD-10-CM | POA: Diagnosis not present

## 2017-09-26 DIAGNOSIS — N186 End stage renal disease: Secondary | ICD-10-CM | POA: Diagnosis not present

## 2017-09-26 DIAGNOSIS — D509 Iron deficiency anemia, unspecified: Secondary | ICD-10-CM | POA: Diagnosis not present

## 2017-09-26 DIAGNOSIS — N2581 Secondary hyperparathyroidism of renal origin: Secondary | ICD-10-CM | POA: Diagnosis not present

## 2017-09-26 DIAGNOSIS — E1029 Type 1 diabetes mellitus with other diabetic kidney complication: Secondary | ICD-10-CM | POA: Diagnosis not present

## 2017-09-26 DIAGNOSIS — D631 Anemia in chronic kidney disease: Secondary | ICD-10-CM | POA: Diagnosis not present

## 2017-09-29 DIAGNOSIS — D509 Iron deficiency anemia, unspecified: Secondary | ICD-10-CM | POA: Diagnosis not present

## 2017-09-29 DIAGNOSIS — N186 End stage renal disease: Secondary | ICD-10-CM | POA: Diagnosis not present

## 2017-09-29 DIAGNOSIS — Z992 Dependence on renal dialysis: Secondary | ICD-10-CM | POA: Diagnosis not present

## 2017-09-29 DIAGNOSIS — D631 Anemia in chronic kidney disease: Secondary | ICD-10-CM | POA: Diagnosis not present

## 2017-09-29 DIAGNOSIS — E1029 Type 1 diabetes mellitus with other diabetic kidney complication: Secondary | ICD-10-CM | POA: Diagnosis not present

## 2017-09-29 DIAGNOSIS — N2581 Secondary hyperparathyroidism of renal origin: Secondary | ICD-10-CM | POA: Diagnosis not present

## 2017-10-01 DIAGNOSIS — D631 Anemia in chronic kidney disease: Secondary | ICD-10-CM | POA: Diagnosis not present

## 2017-10-01 DIAGNOSIS — Z992 Dependence on renal dialysis: Secondary | ICD-10-CM | POA: Diagnosis not present

## 2017-10-01 DIAGNOSIS — E1029 Type 1 diabetes mellitus with other diabetic kidney complication: Secondary | ICD-10-CM | POA: Diagnosis not present

## 2017-10-01 DIAGNOSIS — D509 Iron deficiency anemia, unspecified: Secondary | ICD-10-CM | POA: Diagnosis not present

## 2017-10-01 DIAGNOSIS — N2581 Secondary hyperparathyroidism of renal origin: Secondary | ICD-10-CM | POA: Diagnosis not present

## 2017-10-01 DIAGNOSIS — N186 End stage renal disease: Secondary | ICD-10-CM | POA: Diagnosis not present

## 2017-10-02 ENCOUNTER — Ambulatory Visit (INDEPENDENT_AMBULATORY_CARE_PROVIDER_SITE_OTHER): Payer: Medicare Other | Admitting: Endocrinology

## 2017-10-02 ENCOUNTER — Encounter: Payer: Self-pay | Admitting: Endocrinology

## 2017-10-02 VITALS — BP 148/82 | HR 88 | Wt 226.2 lb

## 2017-10-02 DIAGNOSIS — N184 Chronic kidney disease, stage 4 (severe): Secondary | ICD-10-CM

## 2017-10-02 DIAGNOSIS — E1122 Type 2 diabetes mellitus with diabetic chronic kidney disease: Secondary | ICD-10-CM | POA: Diagnosis not present

## 2017-10-02 LAB — POCT GLYCOSYLATED HEMOGLOBIN (HGB A1C): HEMOGLOBIN A1C: 6.2

## 2017-10-02 NOTE — Patient Instructions (Addendum)
Please continue the same medication check your blood sugar once a day.  vary the time of day when you check, between before the 3 meals, and at bedtime.  also check if you have symptoms of your blood sugar being too high or too low.  please keep a record of the readings and bring it to your next appointment here.  You can write it on any piece of paper.  please call us sooner if your blood sugar goes below 70, or if you have a lot of readings over 200.   Please come back for a follow-up appointment in 4-6 months.    

## 2017-10-02 NOTE — Progress Notes (Signed)
Subjective:    Patient ID: Patrick [name], male    DOB: March 16, 1956, 62 y.o.   MRN: [id number]  HPI Pt returns for f/u of diabetes mellitus: DM type: 2 Dx'ed: 2014, when he presented with severe hyperglycemia.   Complications: polyneuropathy, CAD, PAD, and renal failure.  Therapy: repaglinide.  DKA: never. Severe hypoglycemia: never. Pancreatitis: never. Other: he took insulin 2014-2016; he had renal transplant in 2009, but renal failure has recurred.  He is back on dialysis.  Fructosamine has suggested a1c approx 1% higher than a1c itself.  Interval history: pt states he feels well in general. no cbg record, but states cbg's are well-controlled.  pt states he feels well in general. Past Medical History:  Diagnosis Date  . Arthritis    "back" (09/24/2016)  . ESRD (end stage renal disease) on dialysis [facility])    "TTS; [place]" (09/24/2016)  . Gout   . History of blood transfusion 2009- 2016   "I've had several; low HgB"  . History of kidney stones    treated with nephrectomy  . Hypertension   . Impotence of organic origin   . Migraine    "stopped in my 30's; they were related to high BP" (09/24/2016)  . Myocardial infarction [facility]) '96  . Pneumonia 01/2015   " a touch & I was in the hosp."  . Secondary hyperparathyroidism ([place])   . Type 2 diabetes mellitus (HCC)    no longer on medication since going on dialysis    Past Surgical History:  Procedure Laterality Date  . A-FLUTTER ABLATION N/A 09/24/2016   Procedure: A-Flutter Ablation;  Surgeon: Will Meredith Leeds, MD;  Location: [place] [facility];  Service: Cardiovascular;  Laterality: N/A;  . ABDOMINAL AORTOGRAM W/LOWER EXTREMITY N/A 04/13/2017   Procedure: ABDOMINAL AORTOGRAM W/LOWER EXTREMITY;  Surgeon: Conrad Hassell, MD;  Location: [place] [facility];  Service: Cardiovascular;  Laterality: N/A;  Bilater lower extermity  . APPENDECTOMY    . AV FISTULA PLACEMENT Right 09/18/2014   Procedure: INSERTION OF ARTERIOVENOUS (AV)  GORE-TEX GRAFT ARM USING 4-7MM  X 45CM STRETCH GORE-TEX VASCULAR GRAFT;  Surgeon: Rosetta Posner, MD;  Location: [place];  Service: Vascular;  Laterality: Right;  . AV FISTULA PLACEMENT Left 07/07/2016   Procedure: INSERTION OF LEFT BRACHIAL TO AXILLARY ARTERIOVENOUS (AV) GORE-TEX ARM GRAFT;  Surgeon: Conrad Oneida, MD;  Location: [place];  Service: Vascular;  Laterality: Left;  . CARDIAC CATHETERIZATION  ~ 2016  . 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 HERNIA REPAIR  2017   with nephrectomy  . IR GENERIC HISTORICAL  05/11/2016   IR FLUORO GUIDE CV LINE LEFT 05/11/2016 Corrie Mckusick, DO [facility]  . IR GENERIC HISTORICAL  05/11/2016   IR US GUIDE VASC ACCESS LEFT 05/11/2016 Corrie Mckusick, DO [facility]  . IR GENERIC HISTORICAL  05/11/2016   IR US GUIDE VASC ACCESS RIGHT 05/11/2016 Corrie Mckusick, DO [facility]  . IR GENERIC HISTORICAL  05/11/2016   IR RADIOLOGY PERIPHERAL GUIDED IV START 05/11/2016 Corrie Mckusick, DO [facility]  . KIDNEY TRANSPLANT  2009  . NEPHRECTOMY  2017   transplant rejected   . PERIPHERAL VASCULAR CATHETERIZATION N/A 06/04/2016   Procedure: Upper Extremity Venography;  Surgeon: Waynetta Sandy, MD;  Location: [place] [facility];  Service: Cardiovascular;  Laterality: N/A;  . PERIPHERAL VASCULAR INTERVENTION  04/13/2017   Procedure: PERIPHERAL VASCULAR INTERVENTION;  Surgeon: Bridgett Larsson,  Jannette Fogo, MD;  Location: [place] [facility];  Service: Cardiovascular;;  Lt. Common/Exernal  Iliac  . THROMBECTOMY W/ EMBOLECTOMY Left 09/14/2017   Procedure: THROMBECTOMY ARTERIOVENOUS GORE-TEX GRAFT LEFT UPPER ARM;  Surgeon: Rosetta Posner, MD;  [facility];  Service: Vascular;  Laterality: Left;    Social History   Socioeconomic History  . Marital status: Married    Spouse name: Not on file  . Number of children: Not on file  . Years of education: Not on file  . Highest education level: Not on  file  Social Needs  . Financial resource strain: Not on file  . Food insecurity - worry: Not on file  . Food insecurity - inability: Not on file  . Transportation needs - medical: Not on file  . Transportation needs - non-medical: Not on file  Occupational History  . Not on file  Tobacco Use  . Smoking status: Former Smoker    Types: Cigarettes    Last attempt to quit: 07/04/2014    Years since quitting: 3.2  . Smokeless tobacco: Never Used  . Tobacco comment: "smoked ~ 1 pack/month when I did smoke; never a steady smoker"  Substance and Sexual Activity  . Alcohol use: Yes    Alcohol/week: 0.0 oz    Comment: 09/24/2016 "2 drinks, 1-2 times/year"  . Drug use: No  . Sexual activity: Not Currently    Birth control/protection: None  Other Topics Concern  . Not on file  Social History Narrative  . Not on file    Current Outpatient Medications on File Prior to Visit  Medication Sig Dispense Refill  . acetaminophen (TYLENOL) 500 MG tablet Take 1,000 mg by mouth every 6 (six) hours as needed for moderate pain or headache.    . albuterol (PROVENTIL HFA;VENTOLIN HFA) 108 (90 Base) MCG/ACT inhaler Inhale 1-2 puffs into the lungs every 6 (six) hours as needed for wheezing or shortness of breath.    Marland Kitchen aspirin EC 81 MG tablet Take 81 mg by mouth [name].    . calcium acetate (PHOSLO) 667 MG capsule Take 2,001 mg by mouth 3 (three) times [name] with meals.     . calcium-vitamin D (OSCAL WITH D) 500-200 MG-UNIT tablet Take 1 tablet by mouth 2 (two) times a week.    . clopidogrel (PLAVIX) 75 MG tablet Take 1 tablet (75 mg total) by mouth [name]. 30 tablet 11  . colchicine 0.6 MG tablet Take 1 tablet (0.6 mg total) by mouth every 14 (fourteen) days. (Patient taking differently: Take 0.6 mg by mouth [name] as needed (for gout flare up). )    . repaglinide (PRANDIN) 0.5 MG tablet Take 1 tablet (0.5 mg total) [name] with supper by mouth. (Patient taking differently: Take 0.5 mg by mouth [name] as needed  (sugar control). ) 90 tablet 3  . traMADol (ULTRAM) 50 MG tablet Take 1 tablet (50 mg total) by mouth every 6 (six) hours as needed for moderate pain. 10 tablet 0  . carvedilol (COREG) 6.25 MG tablet Take 1 tablet (6.25 mg total) by mouth 2 (two) times [name]. (Patient not taking: Reported on 10/02/2017) 180 tablet 2  . isosorbide mononitrate (IMDUR) 60 MG 24 hr tablet Take 1 tablet (60 mg total) by mouth [name]. 90 tablet 3   No current facility-administered medications on file prior to visit.     Allergies  Allergen Reactions  . Lipitor [Atorvastatin]     Leg pain  . Metoprolol     Headaches   .  Iodinated Diagnostic Agents Rash    Other Reaction: burning to mouth, swelling of lips.    Family History  Problem Relation Age of Onset  . Hyperlipidemia Mother   . Hypertension Mother     BP (!) 148/82 (BP Location: Right Arm, Patient Position: Sitting, Cuff Size: Normal)   Pulse 88   Wt 226 lb 3.2 oz (102.6 kg)   SpO2 96%   BMI 36.51 kg/m    Review of Systems He denies hypoglycemia    Objective:   Physical Exam VITAL SIGNS:  See vs page GENERAL: no distress Pulses: dorsalis pedis intact bilat.   MSK: no deformity of the feet.  CV: no leg edema Skin:  no ulcer on the feet.  normal color and temp on the feet. Neuro: sensation is intact to touch on the feet Ext: There is bilateral onychomycosis of the toenails.     Lab Results  Component Value Date   HGBA1C 6.2 10/02/2017   Lab Results  Component Value Date   CREATININE 14.15 (H) 07/10/2017   BUN 36 (H) 07/10/2017   NA 137 09/14/2017   K 4.5 09/14/2017   CL 93 (L) 07/10/2017   CO2 27 07/10/2017       Assessment & Plan:  Type 2 DM: well-controlled renal failure: in this setting, a1c overstimates glycemic control  Patient Instructions  Please continue the same medication check your blood sugar once a day.  vary the time of day when you check, between before the 3 meals, and at bedtime.  also check if you have  symptoms of your blood sugar being too high or too low.  please keep a record of the readings and bring it to your next appointment here.  You can write it on any piece of paper.  please call us sooner if your blood sugar goes below 70, or if you have a lot of readings over 200.   Please come back for a follow-up appointment in 4-6 months.

## 2017-10-03 DIAGNOSIS — N186 End stage renal disease: Secondary | ICD-10-CM | POA: Diagnosis not present

## 2017-10-03 DIAGNOSIS — D509 Iron deficiency anemia, unspecified: Secondary | ICD-10-CM | POA: Diagnosis not present

## 2017-10-03 DIAGNOSIS — N2581 Secondary hyperparathyroidism of renal origin: Secondary | ICD-10-CM | POA: Diagnosis not present

## 2017-10-03 DIAGNOSIS — E1029 Type 1 diabetes mellitus with other diabetic kidney complication: Secondary | ICD-10-CM | POA: Diagnosis not present

## 2017-10-03 DIAGNOSIS — Z992 Dependence on renal dialysis: Secondary | ICD-10-CM | POA: Diagnosis not present

## 2017-10-03 DIAGNOSIS — D631 Anemia in chronic kidney disease: Secondary | ICD-10-CM | POA: Diagnosis not present

## 2017-10-06 DIAGNOSIS — E1029 Type 1 diabetes mellitus with other diabetic kidney complication: Secondary | ICD-10-CM | POA: Diagnosis not present

## 2017-10-06 DIAGNOSIS — D509 Iron deficiency anemia, unspecified: Secondary | ICD-10-CM | POA: Diagnosis not present

## 2017-10-06 DIAGNOSIS — N2581 Secondary hyperparathyroidism of renal origin: Secondary | ICD-10-CM | POA: Diagnosis not present

## 2017-10-06 DIAGNOSIS — Z992 Dependence on renal dialysis: Secondary | ICD-10-CM | POA: Diagnosis not present

## 2017-10-06 DIAGNOSIS — N186 End stage renal disease: Secondary | ICD-10-CM | POA: Diagnosis not present

## 2017-10-06 DIAGNOSIS — D631 Anemia in chronic kidney disease: Secondary | ICD-10-CM | POA: Diagnosis not present

## 2017-10-08 ENCOUNTER — Ambulatory Visit: Admit: 2017-10-08 | Payer: Medicare Other | Admitting: Vascular Surgery

## 2017-10-08 ENCOUNTER — Encounter (HOSPITAL_COMMUNITY)
Admission: RE | Disposition: A | Discharge status: Home / Self Care | Payer: Self-pay | Source: Ambulatory Visit | Attending: Vascular Surgery

## 2017-10-08 ENCOUNTER — Other Ambulatory Visit: Payer: Self-pay | Admitting: *Deleted

## 2017-10-08 ENCOUNTER — Other Ambulatory Visit: Payer: Self-pay

## 2017-10-08 ENCOUNTER — Encounter (HOSPITAL_COMMUNITY): Payer: Self-pay

## 2017-10-08 ENCOUNTER — Ambulatory Visit (HOSPITAL_COMMUNITY): Payer: Medicare Other | Admitting: Anesthesiology

## 2017-10-08 ENCOUNTER — Ambulatory Visit (HOSPITAL_COMMUNITY): Payer: Medicare Other

## 2017-10-08 ENCOUNTER — Ambulatory Visit (HOSPITAL_COMMUNITY)
Admission: RE | Admit: 2017-10-08 | Discharge: 2017-10-08 | Disposition: A | Discharge status: Home / Self Care | Payer: Medicare Other | Source: Ambulatory Visit | Attending: Vascular Surgery | Admitting: Vascular Surgery

## 2017-10-08 ENCOUNTER — Telehealth: Payer: Self-pay | Admitting: *Deleted

## 2017-10-08 DIAGNOSIS — D631 Anemia in chronic kidney disease: Secondary | ICD-10-CM | POA: Diagnosis not present

## 2017-10-08 DIAGNOSIS — I120 Hypertensive chronic kidney disease with stage 5 chronic kidney disease or end stage renal disease: Secondary | ICD-10-CM | POA: Diagnosis not present

## 2017-10-08 DIAGNOSIS — I252 Old myocardial infarction: Secondary | ICD-10-CM | POA: Diagnosis not present

## 2017-10-08 DIAGNOSIS — Z992 Dependence on renal dialysis: Secondary | ICD-10-CM | POA: Diagnosis not present

## 2017-10-08 DIAGNOSIS — M109 Gout, unspecified: Secondary | ICD-10-CM | POA: Insufficient documentation

## 2017-10-08 DIAGNOSIS — T82868D Thrombosis of vascular prosthetic devices, implants and grafts, subsequent encounter: Secondary | ICD-10-CM | POA: Diagnosis not present

## 2017-10-08 DIAGNOSIS — Z951 Presence of aortocoronary bypass graft: Secondary | ICD-10-CM | POA: Insufficient documentation

## 2017-10-08 DIAGNOSIS — E1122 Type 2 diabetes mellitus with diabetic chronic kidney disease: Secondary | ICD-10-CM | POA: Diagnosis not present

## 2017-10-08 DIAGNOSIS — I132 Hypertensive heart and chronic kidney disease with heart failure and with stage 5 chronic kidney disease, or end stage renal disease: Secondary | ICD-10-CM | POA: Diagnosis not present

## 2017-10-08 DIAGNOSIS — Z95828 Presence of other vascular implants and grafts: Secondary | ICD-10-CM

## 2017-10-08 DIAGNOSIS — Z09 Encounter for follow-up examination after completed treatment for conditions other than malignant neoplasm: Secondary | ICD-10-CM

## 2017-10-08 DIAGNOSIS — I2510 Atherosclerotic heart disease of native coronary artery without angina pectoris: Secondary | ICD-10-CM | POA: Diagnosis not present

## 2017-10-08 DIAGNOSIS — J189 Pneumonia, unspecified organism: Secondary | ICD-10-CM | POA: Diagnosis not present

## 2017-10-08 DIAGNOSIS — Z452 Encounter for adjustment and management of vascular access device: Secondary | ICD-10-CM | POA: Diagnosis not present

## 2017-10-08 DIAGNOSIS — T8619 Other complication of kidney transplant: Secondary | ICD-10-CM

## 2017-10-08 DIAGNOSIS — T82868A Thrombosis of vascular prosthetic devices, implants and grafts, initial encounter: Secondary | ICD-10-CM | POA: Diagnosis not present

## 2017-10-08 DIAGNOSIS — Z87891 Personal history of nicotine dependence: Secondary | ICD-10-CM | POA: Insufficient documentation

## 2017-10-08 DIAGNOSIS — I5023 Acute on chronic systolic (congestive) heart failure: Secondary | ICD-10-CM | POA: Diagnosis not present

## 2017-10-08 DIAGNOSIS — Z6836 Body mass index (BMI) 36.0-36.9, adult: Secondary | ICD-10-CM | POA: Insufficient documentation

## 2017-10-08 DIAGNOSIS — Z940 Kidney transplant status: Secondary | ICD-10-CM | POA: Insufficient documentation

## 2017-10-08 DIAGNOSIS — Y832 Surgical operation with anastomosis, bypass or graft as the cause of abnormal reaction of the patient, or of later complication, without mention of misadventure at the time of the procedure: Secondary | ICD-10-CM | POA: Insufficient documentation

## 2017-10-08 DIAGNOSIS — Z905 Acquired absence of kidney: Secondary | ICD-10-CM | POA: Insufficient documentation

## 2017-10-08 DIAGNOSIS — N2581 Secondary hyperparathyroidism of renal origin: Secondary | ICD-10-CM | POA: Insufficient documentation

## 2017-10-08 DIAGNOSIS — E669 Obesity, unspecified: Secondary | ICD-10-CM | POA: Insufficient documentation

## 2017-10-08 DIAGNOSIS — N186 End stage renal disease: Secondary | ICD-10-CM | POA: Insufficient documentation

## 2017-10-08 HISTORY — PX: THROMBECTOMY AND REVISION OF ARTERIOVENTOUS (AV) GORETEX  GRAFT: SHX6120

## 2017-10-08 HISTORY — PX: INSERTION OF DIALYSIS CATHETER: SHX1324

## 2017-10-08 LAB — POCT I-STAT 4, (NA,K, GLUC, HGB,HCT)
GLUCOSE: 102 mg/dL — AB (ref 65–99)
HEMATOCRIT: 30 % — AB (ref 39.0–52.0)
Hemoglobin: 10.2 g/dL — ABNORMAL LOW (ref 13.0–17.0)
POTASSIUM: 4 mmol/L (ref 3.5–5.1)
SODIUM: 139 mmol/L (ref 135–145)

## 2017-10-08 LAB — GLUCOSE, CAPILLARY
GLUCOSE-CAPILLARY: 101 mg/dL — AB (ref 65–99)
Glucose-Capillary: 99 mg/dL (ref 65–99)

## 2017-10-08 SURGERY — THROMBECTOMY AND REVISION OF ARTERIOVENTOUS (AV) GORETEX  GRAFT
Anesthesia: General | Site: Neck

## 2017-10-08 SURGERY — THROMBECTOMY ARTERIOVENOUS GORE-TEX GRAFT
Anesthesia: General

## 2017-10-08 MED ORDER — GLYCOPYRROLATE 0.2 MG/ML IV SOSY
PREFILLED_SYRINGE | INTRAVENOUS | Status: DC | PRN
  Administered 2017-10-08: .2 mg via INTRAVENOUS
  Administered 2017-10-08 (×2): .1 mg via INTRAVENOUS

## 2017-10-08 MED ORDER — SODIUM CHLORIDE 0.9 % IV SOLN: INTRAVENOUS | Status: DC

## 2017-10-08 MED ORDER — PHENYLEPHRINE 40 MCG/ML (10ML) SYRINGE FOR IV PUSH (FOR BLOOD PRESSURE SUPPORT)
PREFILLED_SYRINGE | INTRAVENOUS | Status: AC
  Filled 2017-10-08: qty 10

## 2017-10-08 MED ORDER — LIDOCAINE HCL (PF) 1 % IJ SOLN
INTRAMUSCULAR | Status: AC
  Filled 2017-10-08: qty 30

## 2017-10-08 MED ORDER — FENTANYL CITRATE (PF) 250 MCG/5ML IJ SOLN
INTRAMUSCULAR | Status: DC | PRN
  Administered 2017-10-08 (×2): 50 ug via INTRAVENOUS
  Administered 2017-10-08 (×2): 25 ug via INTRAVENOUS
  Administered 2017-10-08 (×2): 50 ug via INTRAVENOUS

## 2017-10-08 MED ORDER — CHLORHEXIDINE GLUCONATE CLOTH 2 % EX PADS: 6.0000 | MEDICATED_PAD | Freq: Once | CUTANEOUS | Status: DC

## 2017-10-08 MED ORDER — 0.9 % SODIUM CHLORIDE (POUR BTL) OPTIME
TOPICAL | Status: DC | PRN
  Administered 2017-10-08: 1000 mL

## 2017-10-08 MED ORDER — ONDANSETRON HCL 4 MG/2ML IJ SOLN
INTRAMUSCULAR | Status: DC | PRN
  Administered 2017-10-08: 4 mg via INTRAVENOUS

## 2017-10-08 MED ORDER — HEPARIN SODIUM (PORCINE) 1000 UNIT/ML IJ SOLN
INTRAMUSCULAR | Status: DC | PRN
  Administered 2017-10-08: 1000 [IU]

## 2017-10-08 MED ORDER — CEFAZOLIN SODIUM-DEXTROSE 2-4 GM/100ML-% IV SOLN
2.0000 g | INTRAVENOUS | Status: AC
  Administered 2017-10-08: 2 g via INTRAVENOUS
  Filled 2017-10-08: qty 100

## 2017-10-08 MED ORDER — PHENYLEPHRINE HCL 10 MG/ML IJ SOLN
INTRAVENOUS | Status: DC | PRN
  Administered 2017-10-08: 25 ug/min via INTRAVENOUS

## 2017-10-08 MED ORDER — PROPOFOL 10 MG/ML IV BOLUS
INTRAVENOUS | Status: AC
  Filled 2017-10-08: qty 20

## 2017-10-08 MED ORDER — ONDANSETRON HCL 4 MG/2ML IJ SOLN: 4.0000 mg | Freq: Once | INTRAMUSCULAR | Status: DC | PRN

## 2017-10-08 MED ORDER — DEXAMETHASONE SODIUM PHOSPHATE 10 MG/ML IJ SOLN
INTRAMUSCULAR | Status: AC
  Filled 2017-10-08: qty 1

## 2017-10-08 MED ORDER — SODIUM CHLORIDE 0.9 % IV SOLN
INTRAVENOUS | Status: DC
  Administered 2017-10-08 (×2): via INTRAVENOUS

## 2017-10-08 MED ORDER — FENTANYL CITRATE (PF) 250 MCG/5ML IJ SOLN
INTRAMUSCULAR | Status: AC
  Filled 2017-10-08: qty 5

## 2017-10-08 MED ORDER — FENTANYL CITRATE (PF) 100 MCG/2ML IJ SOLN: 25.0000 ug | INTRAMUSCULAR | Status: DC | PRN

## 2017-10-08 MED ORDER — PROPOFOL 10 MG/ML IV BOLUS
INTRAVENOUS | Status: DC | PRN
  Administered 2017-10-08: 150 mg via INTRAVENOUS
  Administered 2017-10-08: 50 mg via INTRAVENOUS

## 2017-10-08 MED ORDER — TRAMADOL HCL 50 MG PO TABS: 50.0000 mg | ORAL_TABLET | Freq: Four times a day (QID) | ORAL | 0 refills | Status: DC | PRN

## 2017-10-08 MED ORDER — ONDANSETRON HCL 4 MG/2ML IJ SOLN
INTRAMUSCULAR | Status: AC
  Filled 2017-10-08: qty 2

## 2017-10-08 MED ORDER — LIDOCAINE 2% (20 MG/ML) 5 ML SYRINGE
INTRAMUSCULAR | Status: DC | PRN
  Administered 2017-10-08: 100 mg via INTRAVENOUS

## 2017-10-08 MED ORDER — HEPARIN SODIUM (PORCINE) 5000 UNIT/ML IJ SOLN
INTRAMUSCULAR | Status: DC | PRN
  Administered 2017-10-08: 14:00:00 500 mL

## 2017-10-08 MED ORDER — HEPARIN SODIUM (PORCINE) 1000 UNIT/ML IJ SOLN
INTRAMUSCULAR | Status: AC
  Filled 2017-10-08: qty 1

## 2017-10-08 MED ORDER — DEXAMETHASONE SODIUM PHOSPHATE 10 MG/ML IJ SOLN
INTRAMUSCULAR | Status: DC | PRN
  Administered 2017-10-08: 4 mg via INTRAVENOUS

## 2017-10-08 MED ORDER — PHENYLEPHRINE 40 MCG/ML (10ML) SYRINGE FOR IV PUSH (FOR BLOOD PRESSURE SUPPORT)
PREFILLED_SYRINGE | INTRAVENOUS | Status: DC | PRN
  Administered 2017-10-08: 80 ug via INTRAVENOUS
  Administered 2017-10-08 (×2): 40 ug via INTRAVENOUS

## 2017-10-08 MED ORDER — LIDOCAINE HCL (CARDIAC) 20 MG/ML IV SOLN
INTRAVENOUS | Status: AC
  Filled 2017-10-08: qty 5

## 2017-10-08 SURGICAL SUPPLY — 56 items
ARMBAND PINK RESTRICT EXTREMIT (MISCELLANEOUS) ×8 IMPLANT
BAG DECANTER FOR FLEXI CONT (MISCELLANEOUS) ×4 IMPLANT
BIOPATCH RED 1 DISK 7.0 (GAUZE/BANDAGES/DRESSINGS) ×3 IMPLANT
BIOPATCH RED 1IN DISK 7.0MM (GAUZE/BANDAGES/DRESSINGS) ×1
CANISTER SUCT 3000ML PPV (MISCELLANEOUS) ×4 IMPLANT
CATH EMB 4FR 40CM (CATHETERS) ×8 IMPLANT
CATH EMB 4FR 80CM (CATHETERS) IMPLANT
CATH EMB 5FR 80CM (CATHETERS) ×4 IMPLANT
CATH PALINDROME RT-P 15FX19CM (CATHETERS) IMPLANT
CATH PALINDROME RT-P 15FX23CM (CATHETERS) IMPLANT
CATH PALINDROME RT-P 15FX28CM (CATHETERS) ×4 IMPLANT
CATH PALINDROME RT-P 15FX55CM (CATHETERS) IMPLANT
CATH STRAIGHT 5FR 65CM (CATHETERS) IMPLANT
CLIP VESOCCLUDE MED 6/CT (CLIP) ×4 IMPLANT
CLIP VESOCCLUDE SM WIDE 6/CT (CLIP) ×4 IMPLANT
COVER PROBE W GEL 5X96 (DRAPES) ×4 IMPLANT
COVER SURGICAL LIGHT HANDLE (MISCELLANEOUS) ×4 IMPLANT
DECANTER SPIKE VIAL GLASS SM (MISCELLANEOUS) ×4 IMPLANT
DERMABOND ADVANCED (GAUZE/BANDAGES/DRESSINGS) ×4
DERMABOND ADVANCED .7 DNX12 (GAUZE/BANDAGES/DRESSINGS) ×4 IMPLANT
DRAPE C-ARM 42X72 X-RAY (DRAPES) ×4 IMPLANT
DRAPE CHEST BREAST 15X10 FENES (DRAPES) ×4 IMPLANT
ELECT REM PT RETURN 9FT ADLT (ELECTROSURGICAL) ×4
ELECTRODE REM PT RTRN 9FT ADLT (ELECTROSURGICAL) ×2 IMPLANT
GAUZE SPONGE 4X4 16PLY XRAY LF (GAUZE/BANDAGES/DRESSINGS) ×4 IMPLANT
GLOVE BIO SURGEON STRL SZ7 (GLOVE) ×4 IMPLANT
GLOVE BIOGEL PI IND STRL 7.5 (GLOVE) ×2 IMPLANT
GLOVE BIOGEL PI INDICATOR 7.5 (GLOVE) ×2
GOWN STRL REUS W/ TWL LRG LVL3 (GOWN DISPOSABLE) ×6 IMPLANT
GOWN STRL REUS W/TWL LRG LVL3 (GOWN DISPOSABLE) ×6
HEMOSTAT SPONGE AVITENE ULTRA (HEMOSTASIS) IMPLANT
KIT BASIN OR (CUSTOM PROCEDURE TRAY) ×4 IMPLANT
KIT ROOM TURNOVER OR (KITS) ×4 IMPLANT
NEEDLE 18GX1X1/2 (RX/OR ONLY) (NEEDLE) ×4 IMPLANT
NEEDLE HYPO 25GX1X1/2 BEV (NEEDLE) ×4 IMPLANT
NS IRRIG 1000ML POUR BTL (IV SOLUTION) ×4 IMPLANT
PACK CV ACCESS (CUSTOM PROCEDURE TRAY) ×4 IMPLANT
PACK SURGICAL SETUP 50X90 (CUSTOM PROCEDURE TRAY) ×4 IMPLANT
PAD ARMBOARD 7.5X6 YLW CONV (MISCELLANEOUS) ×8 IMPLANT
SET MICROPUNCTURE 5F STIFF (MISCELLANEOUS) IMPLANT
SOAP 2 % CHG 4 OZ (WOUND CARE) ×4 IMPLANT
SUT ETHILON 3 0 PS 1 (SUTURE) ×4 IMPLANT
SUT MNCRL AB 4-0 PS2 18 (SUTURE) ×8 IMPLANT
SUT PROLENE 6 0 BV (SUTURE) ×4 IMPLANT
SUT VIC AB 3-0 SH 27 (SUTURE) ×2
SUT VIC AB 3-0 SH 27X BRD (SUTURE) ×2 IMPLANT
SYR 10ML LL (SYRINGE) ×4 IMPLANT
SYR 20CC LL (SYRINGE) ×8 IMPLANT
SYR 3ML LL SCALE MARK (SYRINGE) ×4 IMPLANT
SYR 5ML LL (SYRINGE) ×4 IMPLANT
SYR CONTROL 10ML LL (SYRINGE) ×4 IMPLANT
TOWEL GREEN STERILE (TOWEL DISPOSABLE) ×4 IMPLANT
TOWEL GREEN STERILE FF (TOWEL DISPOSABLE) ×4 IMPLANT
UNDERPAD 30X30 (UNDERPADS AND DIAPERS) ×4 IMPLANT
WATER STERILE IRR 1000ML POUR (IV SOLUTION) ×4 IMPLANT
WIRE AMPLATZ SS-J .035X180CM (WIRE) IMPLANT

## 2017-10-08 NOTE — H&P (Signed)
Brief History and Physical  History of Present Illness   Patrick [name] is a 62 y.o. male who presents with chief complaint: thrombosed LUA AVG.  This patient underwent LUA AVG thrombectomy on 218/19 by Dr. Donnetta Hutching. During that procedure, Dr. Donnetta Hutching found a stent in left axillary vein that extended past the surgically accessible field.  Dr. Donnetta Hutching was able to perform a simple thrombectomy on the LUA AVG.  Pt last ate last night.  His last fluid intake was 10 AM.   Past Medical History:  Diagnosis Date  . Arthritis    "back" (09/24/2016)  . ESRD (end stage renal disease) on dialysis [facility])    "TTS; [place]" (09/24/2016)  . Gout   . History of blood transfusion 2009- 2016   "I've had several; low HgB"  . History of kidney stones    treated with nephrectomy  . Hypertension   . Impotence of organic origin   . Migraine    "stopped in my 30's; they were related to high BP" (09/24/2016)  . Myocardial infarction [facility]) '96  . Pneumonia 01/2015   " a touch & I was in the hosp."  . Secondary hyperparathyroidism ([place])   . Type 2 diabetes mellitus (HCC)    no longer on medication since going on dialysis    Past Surgical History:  Procedure Laterality Date  . A-FLUTTER ABLATION N/A 09/24/2016   Procedure: A-Flutter Ablation;  Surgeon: Will Meredith Leeds, MD;  Location: [place] [facility];  Service: Cardiovascular;  Laterality: N/A;  . ABDOMINAL AORTOGRAM W/LOWER EXTREMITY N/A 04/13/2017   Procedure: ABDOMINAL AORTOGRAM W/LOWER EXTREMITY;  Surgeon: Conrad Watson, MD;  Location: [place] [facility];  Service: Cardiovascular;  Laterality: N/A;  Bilater lower extermity  . APPENDECTOMY    . AV FISTULA PLACEMENT Right 09/18/2014   Procedure: INSERTION OF ARTERIOVENOUS (AV) GORE-TEX GRAFT ARM USING 4-7MM  X 45CM STRETCH GORE-TEX VASCULAR GRAFT;  Surgeon: Rosetta Posner, MD;  Location: [place];  Service: Vascular;  Laterality: Right;  . AV FISTULA PLACEMENT Left 07/07/2016   Procedure: INSERTION OF  LEFT BRACHIAL TO AXILLARY ARTERIOVENOUS (AV) GORE-TEX ARM GRAFT;  Surgeon: Conrad [place], MD;  Location: [place];  Service: Vascular;  Laterality: Left;  . CARDIAC CATHETERIZATION  ~ 2016  . 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 HERNIA REPAIR  2017   with nephrectomy  . IR GENERIC HISTORICAL  05/11/2016   IR FLUORO GUIDE CV LINE LEFT 05/11/2016 Corrie Mckusick, DO [facility]  . IR GENERIC HISTORICAL  05/11/2016   IR US GUIDE VASC ACCESS LEFT 05/11/2016 Corrie Mckusick, DO [facility]  . IR GENERIC HISTORICAL  05/11/2016   IR US GUIDE VASC ACCESS RIGHT 05/11/2016 Corrie Mckusick, DO [facility]  . IR GENERIC HISTORICAL  05/11/2016   IR RADIOLOGY PERIPHERAL GUIDED IV START 05/11/2016 Corrie Mckusick, DO [facility]  . KIDNEY TRANSPLANT  2009  . NEPHRECTOMY  2017   transplant rejected   . PERIPHERAL VASCULAR CATHETERIZATION N/A 06/04/2016   Procedure: Upper Extremity Venography;  Surgeon: Waynetta Sandy, MD;  Location: [place] [facility];  Service: Cardiovascular;  Laterality: N/A;  . PERIPHERAL VASCULAR INTERVENTION  04/13/2017   Procedure: PERIPHERAL VASCULAR INTERVENTION;  Surgeon: Conrad Morrill, MD;  Location: [place] [facility];  Service: Cardiovascular;;  Lt. Common/Exernal  Iliac  . THROMBECTOMY W/ EMBOLECTOMY Left 09/14/2017   Procedure: THROMBECTOMY ARTERIOVENOUS GORE-TEX GRAFT  LEFT UPPER ARM;  Surgeon: Rosetta Posner, MD;  Location: [facility] OR;  Service: Vascular;  Laterality: Left;    Social History   Socioeconomic History  . Marital status: Married    Spouse name: Not on file  . Number of children: Not on file  . Years of education: Not on file  . Highest education level: Not on file  Social Needs  . Financial resource strain: Not on file  . Food insecurity - worry: Not on file  . Food insecurity - inability: Not on file  . Transportation needs - medical: Not on file  . Transportation needs -  non-medical: Not on file  Occupational History  . Not on file  Tobacco Use  . Smoking status: Former Smoker    Types: Cigarettes    Last attempt to quit: 07/04/2014    Years since quitting: 3.2  . Smokeless tobacco: Never Used  . Tobacco comment: "smoked ~ 1 pack/month when I did smoke; never a steady smoker"  Substance and Sexual Activity  . Alcohol use: Yes    Alcohol/week: 0.0 oz    Comment: 09/24/2016 "2 drinks, 1-2 times/year"  . Drug use: No  . Sexual activity: Not Currently    Birth control/protection: None  Other Topics Concern  . Not on file  Social History Narrative  . Not on file    Family History  Problem Relation Age of Onset  . Hyperlipidemia Mother   . Hypertension Mother     Current Facility-Administered Medications  Medication Dose Route Frequency Provider Last Rate Last Dose  . 0.9 %  sodium chloride infusion   Intravenous Continuous Adele Barthel L, MD      . 0.9 %  sodium chloride infusion   Intravenous Continuous Ellender, Karyl Kinnier, MD      . ceFAZolin (ANCEF) IVPB 2g/100 mL premix  2 g Intravenous 30 min Pre-Op Conrad Gilbert, MD      . Chlorhexidine Gluconate Cloth 2 % PADS 6 each  6 each Topical Once Conrad [place], MD      . Chlorhexidine Gluconate Cloth 2 % PADS 6 each  6 each Topical Once Conrad , MD        Allergies  Allergen Reactions  . Lipitor [Atorvastatin]     Leg pain  . Metoprolol     Headaches   . Iodinated Diagnostic Agents Rash    Other Reaction: burning to mouth, swelling of lips.    Review of Systems: As listed above, otherwise negative.   Physical Examination   Vitals:   10/08/17 1242  BP: (!) 142/82  Pulse: 81  Resp: 20  Temp: 97.8 F (36.6 C)  TempSrc: Oral  SpO2: 100%  Weight: 224 lb (101.6 kg)  Height: 5\' 6"  (1.676 m)   Body mass index is 36.15 kg/m.  General Alert, O x 3, WD, NAD  Pulmonary Sym exp, good B air movt, CTA B  Cardiac RRR, Nl S1, S2, no Murmurs, No rubs, No S3,S4  Musculo- skeletal LUA AVG  thrombosed without thrill or bruit  Neurologic Pain and light touch intact in extremities ,     Laboratory  See iStat   Medical Decision Making   Patrick [name] is a 62 y.o. male who presents with: LUA AVG thrombosis, ESRD requiring HD.   The patient is scheduled for: LUA AVG thrombectomy, possible tunneled dialysis catheter placement.  Risk, benefits, and alternatives to access surgery were discussed.  The patient is aware the risks  include but are not limited to: bleeding, infection, steal syndrome, nerve damage, ischemic monomelic neuropathy, failure to mature, and need for additional procedures. The patient is aware the risks of tunneled dialysis catheter placement include but are not limited to: bleeding, infection, central venous injury, pneumothorax, possible venous stenosis, possible malpositioning in the venous system, and possible infections related to long-term catheter presence.   The patient is aware of the risks and agrees to proceed.   Adele Barthel, MD, FACS [facility] [place] Office: [phone] Pager: [phone]  10/08/2017, 1:02 PM

## 2017-10-08 NOTE — Discharge Instructions (Signed)
° °  [facility]  Discharge Instructions  AV Fistula or Graft Surgery for Dialysis Access  Please refer to the following instructions for your post-procedure care. Your surgeon or physician assistant will discuss any changes with you.  Activity  You may drive the day following your surgery, if you are comfortable and no longer taking prescription pain medication. Resume full activity as the soreness in your incision resolves.  Bathing/Showering  You may shower after you go home. Keep your incision dry for 48 hours. Do not soak in a bathtub, hot tub, or swim until the incision heals completely. You may not shower if you have a hemodialysis catheter.  Incision Care  Clean your incision with mild soap and water after 48 hours. Pat the area dry with a clean towel. You do not need a bandage unless otherwise instructed. Do not apply any ointments or creams to your incision. You may have skin glue on your incision. Do not peel it off. It will come off on its own in about one week. Your arm may swell a bit after surgery. To reduce swelling use pillows to elevate your arm so it is above your heart. Your doctor will tell you if you need to lightly wrap your arm with an ACE bandage.  Diet  Resume your normal diet. There are not special food restrictions following this procedure. In order to heal from your surgery, it is CRITICAL to get adequate nutrition. Your body requires vitamins, minerals, and protein. Vegetables are the best source of vitamins and minerals. Vegetables also provide the perfect balance of protein. Processed food has little nutritional value, so try to avoid this.  Medications  Resume taking all of your medications. If your incision is causing pain, you may take over-the counter pain relievers such as acetaminophen (Tylenol). If you were prescribed a stronger pain medication, please be aware these medications can cause nausea and constipation. Prevent  nausea by taking the medication with a snack or meal. Avoid constipation by drinking plenty of fluids and eating foods with high amount of fiber, such as fruits, vegetables, and grains.  Do not take Tylenol if you are taking prescription pain medications.  Follow up You will need to follow up with [facility] for new dialysis access.  The office will call you to schedule the appointment.  Please call us immediately for any of the following conditions:  Increased pain, redness, drainage (pus) from your incision site Fever of 101 degrees or higher Severe or worsening pain at your incision site Hand pain or numbness.  Reduce your risk of vascular disease:  Stop smoking. If you would like help, call [facility] at 1-800-QUIT-NOW 443-829-8076) or [place] at [place] your cholesterol Maintain a desired weight Control your diabetes Keep your blood pressure down  Dialysis  You can continue to use your Permcath until your new access is ready for use.   10/08/2017 Patrick [name] [id number] Nov 12, 1955  Surgeon(s): Conrad [place], MD  Procedure(s): ATTEMPTED THROMBECTOMY AND REVISION OF ARTERIOVENTOUS (AV) GORETEX  GRAFT LEFT UPPER ARM INSERTION OF TUNNELED DIALYSIS CATHETER   If you have any questions, please call the office at [phone].

## 2017-10-08 NOTE — Op Note (Signed)
OPERATIVE NOTE   PROCEDURE: 1. Failed thrombectomy of left upper arm arteriovenous graft  2. Left internal jugular vein cannulation under Sonosite guidance 3. Left internal jugular vein tunneled dialysis catheter placement  PRE-OPERATIVE DIAGNOSIS: thrombosed left upper arm arteriovenous graft   POST-OPERATIVE DIAGNOSIS: same as above   SURGEON: Adele Barthel, MD  ASSISTANT(S): Leontine Locket, PAC   ANESTHESIA: general  ESTIMATED BLOOD LOSS: 50 cc  FINDING(S): 1.  Thrombus within arteriovenous graft without frank evidence of infection 2.  Rupture of multiple Fogarty balloon cause inability to complete thrombectomy: likely due to previous transected stent 3.  Occluded right internal jugular vein on Sonosite 4.  Tips of tunneled dialysis catheter in right atrium 5.  No pneumothorax at end of case  SPECIMEN(S):  none  INDICATIONS:   [name] is a 62 y.o. male who presents with rethrombosed left upper arm arteriovenous graft.  He had previously undergone thrombectomy last month.  I discussed with the patient that this arteriovenous graft was likely not salvageable and placement of tunneled dialysis catheter was likely needed.  Risk, benefits, and alternatives to access surgery were discussed.  The patient is aware the risks include but are not limited to: bleeding, infection, steal syndrome, nerve damage, ischemic monomelic neuropathy, thrombosis, failure to mature, complications related to venous hypertension, need for additional procedures, death and stroke.  The patient is aware the risks of tunneled dialysis catheter placement include but are not limited to: bleeding, infection, central venous injury, pneumothorax, possible venous stenosis, possible malpositioning in the venous system, and possible infections related to long-term catheter presence. The patient was aware of these risks and agreed to proceed.   DESCRIPTION: After obtaining full informed written consent, the  patient was brought back to the operating room and placed supine upon the operating table.  The patient received IV antibiotics prior to induction.  A procedure time out was completed and the correct surgical site was verified.  After obtaining adequate anesthesia, the patient was prepped and draped in the standard fashion for: left arm access procedure.  I made an incision over the mid-segment of the left upper arm arteriovenous graft.  I dissected out the graft with electrocautery.  I placed a vessel loop around the graft.  I made a graftotomy with a 11-blade.  I then passed a 4 Fogarty proximally up the venous arm of this graft.  I extracted some sub-acute thrombus but this balloon ruptured.  I repeated the thrombectomy with another 4 Fogarty.  Again the balloon ruptured.  The segment of repeat rupture suggested there was some element of the prior stented axillary vein was causing the ruptures.  I obtained a 5 Fogarty balloon and performs another two passes up the venous arm.  Again the balloon ruptured.  Subsequently, I felt salvage of this arteriovenous graft was not possible due to the recurrent rupture of Fogarty balloon at the stented segment.  I placed one 2-0 silk tie proximal and distal to the transected segment of the arteriovenous graft.  A 3-0 Vicryl stitch was placed in the subcutaneous tissue.  The skin was then reapproximated with a running stitch of 4-0 Monocryl.    At this point, the drapes were taken down.  The patient was repositioned for a tunneled dialysis catheter placement.  The patient was reprepped and redraped for a tunneled dialysis catheter placement.  Under ultrasound guidance, the left internal jugular vein was cannulated with the 18 gauge needle.  A J-wire was then placed down  into the inferior vena cava under fluoroscopic guidance.  The wire was then secured in place with a clamp to the drapes.  The tapered wire guide was left occluding the lumen of the needle to avoid air  embolism.    I then made stab incisions at the neck and exit sites.   I dissected from the exit site to the cannulation site with a tunneler.   The wire was then unclamped and I removed the needle.  The skin tract and venotomy was dilated serially with graduated dilators, passed under fluoroscopic guidance.   Finally, the dilator-sheath was placed under fluoroscopic guidance into the superior vena cava.  The central dilator and wire were removed.  A 28 cm Palindrome catheter was placed under fluoroscopic guidance down into the right atrium.    The sheath was broken and peeled away while holding the catheter cuff at the level of the skin.  The catheter was clamped with the plastic clamp and the back end of this catheter was transected.  One lumen was docked onto the tunneler.  The catheter was delivered through the subcutaneous tunnel by pulling the tunneler out the exit site.  The catheter was reclamped and was transected a second time, revealing the two lumens of this catheter.  The catheter collar was loaded over the back end of the catheter.  The two  ports were docked onto these two lumens.  The catheter collar was then snapped into place, securing the two ports.  Each port was tested by aspirating and flushing.  No resistance was noted.  Each port was then thoroughly flushed with heparinized saline.  The catheter was secured in placed with two interrupted stitches of 3-0 Nylon tied to the catheter.  The neck incision was closed with a U-stitch of 4-0 Monocryl.  The neck and chest incision were cleaned.  The closed neck incision was reinforced with Dermabond and sterile bandages applied to the catheter exit site.  Each port was then loaded with concentrated heparin (1000 Units/mL) at the manufacturer recommended volumes to each port.  Sterile caps were applied to each port.    On completion fluoroscopy, the tips of the catheter were in the right atrium, and there was no evidence of  pneumothorax.   COMPLICATIONS: none  CONDITION: stable   Adele Barthel, MD, [facility] [place] Office: [phone] Pager: [phone]  10/08/2017, 2:42 PM

## 2017-10-08 NOTE — Progress Notes (Signed)
   10/08/17 1302  OBSTRUCTIVE SLEEP APNEA  Have you ever been diagnosed with sleep apnea through a sleep study? No  Do you snore loudly (loud enough to be heard through closed doors)?  1  Do you often feel tired, fatigued, or sleepy during the daytime (such as falling asleep during driving or talking to someone)? 0  Has anyone observed you stop breathing during your sleep? 1  Do you have, or are you being treated for high blood pressure? 1  BMI more than 35 kg/m2? 1  Age > 50 (1-yes) 1  Neck circumference greater than:Male 16 inches or larger, Male 17inches or larger? 1  Male Gender (Yes=1) 1  Obstructive Sleep Apnea Score 7  Score 5 or greater  Results sent to PCP

## 2017-10-08 NOTE — Anesthesia Procedure Notes (Addendum)
Procedure Name: LMA Insertion Date/Time: 10/08/2017 2:05 PM Performed by: Renato Shin, CRNA Pre-anesthesia Checklist: Patient identified, Emergency Drugs available, Suction available and Patient being monitored Patient Re-evaluated:Patient Re-evaluated prior to induction Oxygen Delivery Method: Circle system utilized Preoxygenation: Pre-oxygenation with 100% oxygen Induction Type: IV induction LMA: LMA with gastric port inserted LMA Size: 5.0 Number of attempts: 1 Placement Confirmation: positive ETCO2,  CO2 detector and breath sounds checked- equal and bilateral Tube secured with: Tape Dental Injury: Teeth and Oropharynx as per pre-operative assessment

## 2017-10-08 NOTE — Anesthesia Preprocedure Evaluation (Addendum)
Anesthesia Evaluation  Patient identified by MRN, date of birth, ID band Patient awake    Reviewed: Allergy & Precautions, NPO status , Patient's Chart, lab work & pertinent test results  Airway Mallampati: III  TM Distance: >3 FB Neck ROM: Full    Dental no notable dental hx.    Pulmonary former smoker,    Pulmonary exam normal breath sounds clear to auscultation       Cardiovascular + CAD and + Past MI  Normal cardiovascular exam Rhythm:Regular Rate:Normal  ECG: NSR, rate 71  Nuclear stress EF: 53%. There is septal wall and apical hypokinesis There was no ST segment deviation noted during stress. Nonspecific T wave changes noted at baseline. Defect 1: There is a medium defect of severe severity present in the mid anteroseptal, apical anterior, apical septal, apical lateral and apex location. Findings consistent with prior myocardial infarction. No significant ischemia identified. This is an intermediate risk study.   Neuro/Psych  Headaches, negative psych ROS   GI/Hepatic negative GI ROS, Neg liver ROS,   Endo/Other  Secondary hyperparathyroidism   Renal/GU ESRF and DialysisRenal diseaseOn HD T, R, Sat     Musculoskeletal negative musculoskeletal ROS (+)   Abdominal (+) + obese,   Peds  Hematology  (+) anemia ,   Anesthesia Other Findings CLOTTED ARTERIOVENOUS GRAFT LEFT UPPER ARM  Reproductive/Obstetrics                            Anesthesia Physical Anesthesia Plan  ASA: IV  Anesthesia Plan: General   Post-op Pain Management:    Induction: Intravenous  PONV Risk Score and Plan: 2 and Ondansetron, Dexamethasone and Treatment may vary due to age or medical condition  Airway Management Planned: LMA  Additional Equipment:   Intra-op Plan:   Post-operative Plan: Extubation in OR  Informed Consent: I have reviewed the patients History and Physical, chart, labs and discussed  the procedure including the risks, benefits and alternatives for the proposed anesthesia with the patient or authorized representative who has indicated his/her understanding and acceptance.   Dental advisory given  Plan Discussed with: CRNA  Anesthesia Plan Comments: (Patient reports taking sips of Pepsi prior to 1000)        Anesthesia Quick Evaluation

## 2017-10-08 NOTE — Transfer of Care (Signed)
Immediate Anesthesia Transfer of Care Note  Patient: Patrick [name]  Procedure(s) Performed: THROMBECTOMY of ARTERIOVENTOUS (AV) GORETEX  GRAFT LEFT UPPER ARM (Left Arm Upper) INSERTION OF TUNNELED DIALYSIS CATHETER (N/A Neck)  Patient Location: PACU  Anesthesia Type:General  Level of Consciousness: awake  Airway & Oxygen Therapy: Patient Spontanous Breathing  Post-op Assessment: Report given to RN and Post -op Vital signs reviewed and stable  Post vital signs: Reviewed and stable  Last Vitals:  Vitals:   10/08/17 1242 10/08/17 1528  BP: (!) 142/82   Pulse: 81   Resp: 20   Temp: 36.6 C (P) 36.4 C  SpO2: 100%     Last Pain:  Vitals:   10/08/17 1528  TempSrc:   PainSc: (P) 0-No pain      Patients Stated Pain Goal: 3 (33/12/50 8719)  Complications: No apparent anesthesia complications

## 2017-10-08 NOTE — Telephone Encounter (Signed)
Call from [place] at [facility]. Patient grft clotted no other access for HD. Last treatment was Tuesday.  Instructed to tell patient to stay NPO (last ate at 9:45am today). Page to Dr. Bridgett Larsson after speaking with [facility] at [place]. Dr. Bridgett Larsson will do procedure today at 4 pm. Call to patient No food or drink except for heart and B/P medications with sips of water. Hold Plavix today. To be at [place] admitting at 1:30pm today. Must have driver for home.Verbalized understanding. All info given to [place] at [facility]. She will follow up with patient re: dialysis.

## 2017-10-09 ENCOUNTER — Encounter (HOSPITAL_COMMUNITY): Payer: Self-pay | Admitting: Vascular Surgery

## 2017-10-09 NOTE — Anesthesia Postprocedure Evaluation (Signed)
Anesthesia Post Note  Patient: Jeneen Rinks A Pala  Procedure(s) Performed: THROMBECTOMY of ARTERIOVENTOUS (AV) GORETEX  GRAFT LEFT UPPER ARM (Left Arm Upper) INSERTION OF TUNNELED DIALYSIS CATHETER (N/A Neck)     Patient location during evaluation: PACU Anesthesia Type: General Level of consciousness: awake and alert Pain management: pain level controlled Vital Signs Assessment: post-procedure vital signs reviewed and stable Respiratory status: spontaneous breathing, nonlabored ventilation and respiratory function stable Cardiovascular status: blood pressure returned to baseline and stable Postop Assessment: no apparent nausea or vomiting Anesthetic complications: no    Last Vitals:  Vitals:   10/08/17 1612 10/08/17 1615  BP: 127/79   Pulse: 84 84  Resp: 16 17  Temp:  (!) 36.3 C  SpO2: 93% 94%    Last Pain:  Vitals:   10/08/17 1528  TempSrc:   PainSc: 0-No pain                 Catalina Gravel

## 2017-10-10 DIAGNOSIS — N186 End stage renal disease: Secondary | ICD-10-CM | POA: Diagnosis not present

## 2017-10-10 DIAGNOSIS — N2581 Secondary hyperparathyroidism of renal origin: Secondary | ICD-10-CM | POA: Diagnosis not present

## 2017-10-10 DIAGNOSIS — Z992 Dependence on renal dialysis: Secondary | ICD-10-CM | POA: Diagnosis not present

## 2017-10-10 DIAGNOSIS — D631 Anemia in chronic kidney disease: Secondary | ICD-10-CM | POA: Diagnosis not present

## 2017-10-10 DIAGNOSIS — D509 Iron deficiency anemia, unspecified: Secondary | ICD-10-CM | POA: Diagnosis not present

## 2017-10-10 DIAGNOSIS — E1029 Type 1 diabetes mellitus with other diabetic kidney complication: Secondary | ICD-10-CM | POA: Diagnosis not present

## 2017-10-12 ENCOUNTER — Telehealth: Payer: Self-pay | Admitting: Vascular Surgery

## 2017-10-12 ENCOUNTER — Other Ambulatory Visit: Payer: Self-pay

## 2017-10-12 DIAGNOSIS — Z992 Dependence on renal dialysis: Secondary | ICD-10-CM

## 2017-10-12 DIAGNOSIS — T8619 Other complication of kidney transplant: Principal | ICD-10-CM

## 2017-10-12 DIAGNOSIS — N186 End stage renal disease: Secondary | ICD-10-CM

## 2017-10-12 NOTE — Telephone Encounter (Signed)
Spoke to pt confirming 11/11/17 appt  LS

## 2017-10-12 NOTE — Telephone Encounter (Signed)
-----   Message from Mena Goes, RN sent at 10/08/2017  2:54 PM EDT ----- Regarding: Next available MD w/ HD access vein mapping   ----- Message ----- From: Gabriel Earing, PA-C Sent: 10/08/2017   2:47 PM To: Vvs Charge Pool  Sp insertion of TDC and attempted thrombectomy and ligation of LUA AVG.  Pt will need to f/u with the next available MD with vein mapping for new access.  Thanks

## 2017-10-13 DIAGNOSIS — E1029 Type 1 diabetes mellitus with other diabetic kidney complication: Secondary | ICD-10-CM | POA: Diagnosis not present

## 2017-10-13 DIAGNOSIS — N186 End stage renal disease: Secondary | ICD-10-CM | POA: Diagnosis not present

## 2017-10-13 DIAGNOSIS — Z992 Dependence on renal dialysis: Secondary | ICD-10-CM | POA: Diagnosis not present

## 2017-10-13 DIAGNOSIS — N2581 Secondary hyperparathyroidism of renal origin: Secondary | ICD-10-CM | POA: Diagnosis not present

## 2017-10-13 DIAGNOSIS — D509 Iron deficiency anemia, unspecified: Secondary | ICD-10-CM | POA: Diagnosis not present

## 2017-10-13 DIAGNOSIS — D631 Anemia in chronic kidney disease: Secondary | ICD-10-CM | POA: Diagnosis not present

## 2017-10-14 DIAGNOSIS — N186 End stage renal disease: Secondary | ICD-10-CM | POA: Diagnosis not present

## 2017-10-14 DIAGNOSIS — Z992 Dependence on renal dialysis: Secondary | ICD-10-CM | POA: Diagnosis not present

## 2017-10-14 DIAGNOSIS — N2581 Secondary hyperparathyroidism of renal origin: Secondary | ICD-10-CM | POA: Diagnosis not present

## 2017-10-14 DIAGNOSIS — E1029 Type 1 diabetes mellitus with other diabetic kidney complication: Secondary | ICD-10-CM | POA: Diagnosis not present

## 2017-10-14 DIAGNOSIS — D509 Iron deficiency anemia, unspecified: Secondary | ICD-10-CM | POA: Diagnosis not present

## 2017-10-14 DIAGNOSIS — D631 Anemia in chronic kidney disease: Secondary | ICD-10-CM | POA: Diagnosis not present

## 2017-10-15 DIAGNOSIS — D631 Anemia in chronic kidney disease: Secondary | ICD-10-CM | POA: Diagnosis not present

## 2017-10-15 DIAGNOSIS — N186 End stage renal disease: Secondary | ICD-10-CM | POA: Diagnosis not present

## 2017-10-15 DIAGNOSIS — E1029 Type 1 diabetes mellitus with other diabetic kidney complication: Secondary | ICD-10-CM | POA: Diagnosis not present

## 2017-10-15 DIAGNOSIS — D509 Iron deficiency anemia, unspecified: Secondary | ICD-10-CM | POA: Diagnosis not present

## 2017-10-15 DIAGNOSIS — Z992 Dependence on renal dialysis: Secondary | ICD-10-CM | POA: Diagnosis not present

## 2017-10-15 DIAGNOSIS — N2581 Secondary hyperparathyroidism of renal origin: Secondary | ICD-10-CM | POA: Diagnosis not present

## 2017-10-17 DIAGNOSIS — Z992 Dependence on renal dialysis: Secondary | ICD-10-CM | POA: Diagnosis not present

## 2017-10-17 DIAGNOSIS — D631 Anemia in chronic kidney disease: Secondary | ICD-10-CM | POA: Diagnosis not present

## 2017-10-17 DIAGNOSIS — N2581 Secondary hyperparathyroidism of renal origin: Secondary | ICD-10-CM | POA: Diagnosis not present

## 2017-10-17 DIAGNOSIS — D509 Iron deficiency anemia, unspecified: Secondary | ICD-10-CM | POA: Diagnosis not present

## 2017-10-17 DIAGNOSIS — N186 End stage renal disease: Secondary | ICD-10-CM | POA: Diagnosis not present

## 2017-10-17 DIAGNOSIS — E1029 Type 1 diabetes mellitus with other diabetic kidney complication: Secondary | ICD-10-CM | POA: Diagnosis not present

## 2017-10-20 DIAGNOSIS — N186 End stage renal disease: Secondary | ICD-10-CM | POA: Diagnosis not present

## 2017-10-20 DIAGNOSIS — E1029 Type 1 diabetes mellitus with other diabetic kidney complication: Secondary | ICD-10-CM | POA: Diagnosis not present

## 2017-10-20 DIAGNOSIS — Z992 Dependence on renal dialysis: Secondary | ICD-10-CM | POA: Diagnosis not present

## 2017-10-20 DIAGNOSIS — N2581 Secondary hyperparathyroidism of renal origin: Secondary | ICD-10-CM | POA: Diagnosis not present

## 2017-10-20 DIAGNOSIS — D631 Anemia in chronic kidney disease: Secondary | ICD-10-CM | POA: Diagnosis not present

## 2017-10-20 DIAGNOSIS — D509 Iron deficiency anemia, unspecified: Secondary | ICD-10-CM | POA: Diagnosis not present

## 2017-10-21 ENCOUNTER — Encounter: Payer: Self-pay | Admitting: Physician Assistant

## 2017-10-21 ENCOUNTER — Ambulatory Visit (INDEPENDENT_AMBULATORY_CARE_PROVIDER_SITE_OTHER): Payer: Medicare Other | Admitting: Physician Assistant

## 2017-10-21 VITALS — BP 134/70 | HR 72 | Ht 66.0 in | Wt 227.0 lb

## 2017-10-21 DIAGNOSIS — N186 End stage renal disease: Secondary | ICD-10-CM | POA: Diagnosis not present

## 2017-10-21 DIAGNOSIS — I10 Essential (primary) hypertension: Secondary | ICD-10-CM

## 2017-10-21 DIAGNOSIS — E785 Hyperlipidemia, unspecified: Secondary | ICD-10-CM

## 2017-10-21 DIAGNOSIS — I25119 Atherosclerotic heart disease of native coronary artery with unspecified angina pectoris: Secondary | ICD-10-CM | POA: Diagnosis not present

## 2017-10-21 MED ORDER — AMLODIPINE BESYLATE 2.5 MG PO TABS: 2.5000 mg | ORAL_TABLET | Freq: Every day | ORAL | 3 refills | Status: DC

## 2017-10-21 NOTE — Progress Notes (Signed)
Cardiology Office Note:    Date:  10/21/2017   ID:  Varney Daily, DOB 03/25/1956, MRN [id number]  PCP:  Patrick Ebbs, MD  Cardiologist:  No primary care provider on file.   Referring MD: Patrick Ebbs, MD   Chief Complaint  Patient presents with  . Follow-up    Chest pain    History of Present Illness:    Patrick [name] is a 62 y.o. 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  In 2007, he developed reduced LV function with EF 30% and high output heart failure attributed to simultaneous left and right AV fistulas for dialysis.  His right AV fistula was closed and he had resolution of symptoms and recovery of LV function.  Cardiac catheterization in February 2007 demonstrated normal left main, LAD, LCx and mid RCA 25%.  LIMA-LAD was atretic.  He had an abnormal Myoview in 2015 at [name] with apical, apical anterior, lateral, apical, inferoapical ischemia.  Cardiac catheterization was deferred at that time secondary to worsening renal function.  His chart indicates he had a heart catheterization sometime around 2016.  I do not have the report.  He is now back on dialysis.  Stress testing with Dr. Einar [name] in 2016 demonstrated a very small area of inferior ischemia.  The study was low risk and he was treated medically.  He was recently evaluated by [name] 08/07/17 for chest pain.  Nuclear stress test demonstrated anteroseptal/apical anterior/apical septal/apical lateral/apical scar but no ischemia.  Patrick [name] reviewed the study and recommended continued medical therapy with Plavix and long-acting nitrates.    Patrick [name] returns for follow-up on chest discomfort.  He is here alone.  He continues to note chest discomfort described as tightness.  This mainly occurs with exertion.  He denies significant  shortness of breath.  He denies any issues around dialysis.  He denies associated nausea or diaphoresis.  He denies syncope.  He denies orthopnea, PND or edema.  He denies chest discomfort with lying supine.  He has not really noted significant improvement in his symptoms with nitrate therapy.  Of note, he has been intolerant to beta-blockers.  He has also been intolerant of statin therapy.  Prior CV studies:   The following studies were reviewed today:  Nuclear stress test 09/02/17 EF 53, anteroseptal/apical anterior/apical septal/apical lateral/apical scar, no ischemia  Echo 01/25/16 Mild concentric LVH, EF 45-50, diffuse HK, grade 2 diastolic dysfunction, very mild aortic stenosis (mean 13, peak 26), MAC, mild MR, mild LAE  Nuclear stress test 01/08/15 (Dr. Einar [name]) EF 55, very small area of ischemia in the basal inferior, inferoapical and apical septal regions; low risk  Echo 11/29/13 Mild LVH, EF 46-28, grade 1 diastolic dysfunction, MAC, mild LAE  Nuclear stress test 10/10/13 ([facility])  FINAL COMMENTS  - Moderate apical and periapical (i.e. apex, apical anterior/ lateral/ inferior segments) ischemia status post regadenoson stress.  - LVEF= 63%. Septal dyskinesis in a post- CABG pattern. Otherwise normal systolic function.  - Mild RV uptake, suggestive of RV hypertrophy.  Past Medical History:  Diagnosis Date  . Arthritis    "back" (09/24/2016)  . ESRD (end stage renal disease) on dialysis [name])    "TTS; [place]" (09/24/2016)  . Gout   . History of blood transfusion 2009- 2016   "I've had several; low HgB"  .  History of kidney stones    treated with nephrectomy  . Hypertension   . Impotence of organic origin   . Migraine    "stopped in my 30's; they were related to high BP" (09/24/2016)  . Myocardial infarction [name]) '96  . Pneumonia 01/2015   " a touch & I was in the hosp."  . Secondary hyperparathyroidism ([name])   . Type 2 diabetes mellitus (HCC)    no longer on medication  since going on dialysis   Surgical Hx: The patient  has a past surgical history that includes Kidney transplant (2009); Appendectomy; AV fistula placement (Right, 09/18/2014); ir generic historical (05/11/2016); ir generic historical (05/11/2016); ir generic historical (05/11/2016); ir generic historical (05/11/2016); Cardiac catheterization (N/A, 06/04/2016); AV fistula placement (Left, 07/07/2016); Hernia repair (2017); Nephrectomy (2017); Cardiac catheterization (~ 2016); A-FLUTTER ABLATION (N/A, 09/24/2016); Coronary artery bypass graft (1997); ABDOMINAL AORTOGRAM W/LOWER EXTREMITY (N/A, 04/13/2017); PERIPHERAL VASCULAR INTERVENTION (04/13/2017); Thrombectomy w/ embolectomy (Left, 09/14/2017); Thrombectomy and revision of arterioventous (av) goretex  graft (Left, 10/08/2017); and Insertion of dialysis catheter (N/A, 10/08/2017).   Current Medications: Current Meds  Medication Sig  . acetaminophen (TYLENOL) 500 MG tablet Take 1,000 mg by mouth every 6 (six) hours as needed for moderate pain or headache.  . albuterol (PROVENTIL HFA;VENTOLIN HFA) 108 (90 Base) MCG/ACT inhaler Inhale 1-2 puffs into the lungs every 6 (six) hours as needed for wheezing or shortness of breath.  Marland Kitchen aspirin EC 81 MG tablet Take 81 mg by mouth daily.  . calcium acetate (PHOSLO) 667 MG capsule Take 2,001 mg by mouth 3 (three) times daily with meals.   . calcium-vitamin D (OSCAL WITH D) 500-200 MG-UNIT tablet Take 1 tablet by mouth 2 (two) times a week.  . clopidogrel (PLAVIX) 75 MG tablet Take 1 tablet (75 mg total) by mouth daily.  . isosorbide mononitrate (IMDUR) 60 MG 24 hr tablet Take 1 tablet (60 mg total) by mouth daily.  . repaglinide (PRANDIN) 0.5 MG tablet Take 1 tablet (0.5 mg total) daily with supper by mouth.  . traMADol (ULTRAM) 50 MG tablet Take 1 tablet (50 mg total) by mouth every 6 (six) hours as needed for moderate pain.     Allergies:   Lipitor [atorvastatin]; Metoprolol; and Iodinated diagnostic agents    Social History   Tobacco Use  . Smoking status: Former Smoker    Types: Cigarettes    Last attempt to quit: 07/04/2014    Years since quitting: 3.3  . Smokeless tobacco: Never Used  . Tobacco comment: "smoked ~ 1 pack/month when I did smoke; never a steady smoker"  Substance Use Topics  . Alcohol use: Yes    Alcohol/week: 0.0 oz    Comment: 09/24/2016 "2 drinks, 1-2 times/year"  . Drug use: No     Family Hx: The patient's family history includes Hyperlipidemia in his mother; Hypertension in his mother.  ROS:   Please see the history of present illness.    ROS All other systems reviewed and are negative.   EKGs/Labs/Other Test Reviewed:    EKG:  EKG is not ordered today.    Recent Labs: 07/10/2017: BUN 36; Creatinine, Ser 14.15; Platelets 196 10/08/2017: Hemoglobin 10.2; Potassium 4.0; Sodium 139   Recent Lipid Panel Lab Results  Component Value Date/Time   CHOL 192 05/25/2016 03:42 PM   TRIG 249 (H) 05/25/2016 03:42 PM   HDL 34 (L) 05/25/2016 03:42 PM   CHOLHDL 5.6 05/25/2016 03:42 PM   LDLCALC 108 (H) 05/25/2016 03:42 PM  Physical Exam:    VS:  BP 134/70   Pulse 72   Ht _0  (1.676 m)   Wt 227 lb (103 kg)   SpO2 99%   BMI 36.64 kg/m     Wt Readings from Last 3 Encounters:  10/21/17 227 lb (103 kg)  10/08/17 224 lb (101.6 kg)  10/02/17 226 lb 3.2 oz (102.6 kg)     Physical Exam  Constitutional: He is oriented to person, place, and time. He appears well-developed and well-nourished. No distress.  HENT:  Head: Normocephalic and atraumatic.  Neck: No JVD present.  Cardiovascular: Normal rate and regular rhythm.  No murmur heard. Pulmonary/Chest: Effort normal. He has no rales.  Abdominal: Soft.  Musculoskeletal: He exhibits no edema.  Neurological: He is alert and oriented to person, place, and time.  Skin: Skin is warm and dry.    ASSESSMENT & PLAN:    #1.  Coronary artery disease with angina pectoris [facility]) He has a history of remote CABG.   His records indicate a cardiac catheterization 2007 demonstrated no significant native vessel disease and an atretic LIMA-LAD.  Recent stress testing demonstrated anterior scar but no ischemia.  He continues to have exertional chest symptoms.  He does not take as needed nitroglycerin.  He has not noted significant improvement in his symptoms with long-acting nitrate therapy.  He remains on aspirin and Plavix.  He has been intolerant of multiple beta-blockers in the past.  I have suggested that we start him on amlodipine to further titrate his antianginal therapy.  I will also review further with [name] +/- proceeding with cardiac catheterization.  -Continue isosorbide, aspirin, Plavix  -Start amlodipine 2.5 mg daily  -Follow-up 4 weeks  -Consider cardiac catheterization if symptoms continue  #2.  Essential hypertension The patient's blood pressure is controlled on his current regimen.  Continue current therapy.   #3.  Hyperlipidemia, unspecified hyperlipidemia type He is not on statin therapy.  He has an intolerance to Lipitor in the past.  It is not clear if he has tried other statin agents.  Question if we should try him on Zetia.  I will arrange for fasting lipids and review further follow-up.  #4.  ESRD (end stage renal disease) ([facility]) He remains on Tuesday, Thursday, Saturday dialysis.   Dispo:  Return in about 1 month (around 11/18/2017) for Close Follow Up, w/ Patrick [name], or Richardson Dopp, PA-C.   Medication Adjustments/Labs and Tests Ordered: Current medicines are reviewed at length with the patient today.  Concerns regarding medicines are outlined above.  Tests Ordered: No orders of the defined types were placed in this encounter.  Medication Changes: Meds ordered this encounter  Medications  . amLODipine (NORVASC) 2.5 MG tablet    Sig: Take 1 tablet (2.5 mg total) by mouth daily.    Dispense:  90 tablet    Refill:  3    Signed, Richardson Dopp, PA-C  10/21/2017 1:30 PM    [facility] [place], [place], [place]  [address] Phone: [phone]; Fax: [phone]

## 2017-10-21 NOTE — Patient Instructions (Addendum)
Medication Instructions:  1. START AMLODIPINE 2.5 MG 1 TABLET DAILY; RX HAS BEEN SENT IN  Labwork: YOU HAVE BEEN GIVEN A PRESCRIPTION TO HAVE LAB WORK DONE IN 2 WEEKS WHEN YOU GO TO DIALYSIS. PLEAS HAVE THE RESULTS FAXED TO [facility] [phone]  Testing/Procedures: NONE ORDERED TODAY  Follow-Up: Nov 27, 2017 @ 10:15 WITH SCOTT WEAVER, PAC   Any Other Special Instructions Will Be Listed Below (If Applicable).     If you need a refill on your cardiac medications before your next appointment, please call your pharmacy.

## 2017-10-22 DIAGNOSIS — E1029 Type 1 diabetes mellitus with other diabetic kidney complication: Secondary | ICD-10-CM | POA: Diagnosis not present

## 2017-10-22 DIAGNOSIS — Z992 Dependence on renal dialysis: Secondary | ICD-10-CM | POA: Diagnosis not present

## 2017-10-22 DIAGNOSIS — N186 End stage renal disease: Secondary | ICD-10-CM | POA: Diagnosis not present

## 2017-10-22 DIAGNOSIS — D509 Iron deficiency anemia, unspecified: Secondary | ICD-10-CM | POA: Diagnosis not present

## 2017-10-22 DIAGNOSIS — I4891 Unspecified atrial fibrillation: Secondary | ICD-10-CM | POA: Diagnosis not present

## 2017-10-22 DIAGNOSIS — D631 Anemia in chronic kidney disease: Secondary | ICD-10-CM | POA: Diagnosis not present

## 2017-10-22 DIAGNOSIS — N2581 Secondary hyperparathyroidism of renal origin: Secondary | ICD-10-CM | POA: Diagnosis not present

## 2017-10-24 DIAGNOSIS — D631 Anemia in chronic kidney disease: Secondary | ICD-10-CM | POA: Diagnosis not present

## 2017-10-24 DIAGNOSIS — N186 End stage renal disease: Secondary | ICD-10-CM | POA: Diagnosis not present

## 2017-10-24 DIAGNOSIS — E1029 Type 1 diabetes mellitus with other diabetic kidney complication: Secondary | ICD-10-CM | POA: Diagnosis not present

## 2017-10-24 DIAGNOSIS — Z992 Dependence on renal dialysis: Secondary | ICD-10-CM | POA: Diagnosis not present

## 2017-10-24 DIAGNOSIS — D509 Iron deficiency anemia, unspecified: Secondary | ICD-10-CM | POA: Diagnosis not present

## 2017-10-24 DIAGNOSIS — N2581 Secondary hyperparathyroidism of renal origin: Secondary | ICD-10-CM | POA: Diagnosis not present

## 2017-10-26 DIAGNOSIS — N186 End stage renal disease: Secondary | ICD-10-CM | POA: Diagnosis not present

## 2017-10-26 DIAGNOSIS — Z992 Dependence on renal dialysis: Secondary | ICD-10-CM | POA: Diagnosis not present

## 2017-10-26 DIAGNOSIS — T8610 Unspecified complication of kidney transplant: Secondary | ICD-10-CM | POA: Diagnosis not present

## 2017-10-27 DIAGNOSIS — N2581 Secondary hyperparathyroidism of renal origin: Secondary | ICD-10-CM | POA: Diagnosis not present

## 2017-10-27 DIAGNOSIS — D631 Anemia in chronic kidney disease: Secondary | ICD-10-CM | POA: Diagnosis not present

## 2017-10-27 DIAGNOSIS — Z992 Dependence on renal dialysis: Secondary | ICD-10-CM | POA: Diagnosis not present

## 2017-10-27 DIAGNOSIS — E1029 Type 1 diabetes mellitus with other diabetic kidney complication: Secondary | ICD-10-CM | POA: Diagnosis not present

## 2017-10-27 DIAGNOSIS — N186 End stage renal disease: Secondary | ICD-10-CM | POA: Diagnosis not present

## 2017-10-29 DIAGNOSIS — Z992 Dependence on renal dialysis: Secondary | ICD-10-CM | POA: Diagnosis not present

## 2017-10-29 DIAGNOSIS — E1029 Type 1 diabetes mellitus with other diabetic kidney complication: Secondary | ICD-10-CM | POA: Diagnosis not present

## 2017-10-29 DIAGNOSIS — N2581 Secondary hyperparathyroidism of renal origin: Secondary | ICD-10-CM | POA: Diagnosis not present

## 2017-10-29 DIAGNOSIS — D631 Anemia in chronic kidney disease: Secondary | ICD-10-CM | POA: Diagnosis not present

## 2017-10-29 DIAGNOSIS — N186 End stage renal disease: Secondary | ICD-10-CM | POA: Diagnosis not present

## 2017-10-31 DIAGNOSIS — Z992 Dependence on renal dialysis: Secondary | ICD-10-CM | POA: Diagnosis not present

## 2017-10-31 DIAGNOSIS — E1029 Type 1 diabetes mellitus with other diabetic kidney complication: Secondary | ICD-10-CM | POA: Diagnosis not present

## 2017-10-31 DIAGNOSIS — N186 End stage renal disease: Secondary | ICD-10-CM | POA: Diagnosis not present

## 2017-10-31 DIAGNOSIS — N2581 Secondary hyperparathyroidism of renal origin: Secondary | ICD-10-CM | POA: Diagnosis not present

## 2017-10-31 DIAGNOSIS — D631 Anemia in chronic kidney disease: Secondary | ICD-10-CM | POA: Diagnosis not present

## 2017-11-03 DIAGNOSIS — N2581 Secondary hyperparathyroidism of renal origin: Secondary | ICD-10-CM | POA: Diagnosis not present

## 2017-11-03 DIAGNOSIS — E1029 Type 1 diabetes mellitus with other diabetic kidney complication: Secondary | ICD-10-CM | POA: Diagnosis not present

## 2017-11-03 DIAGNOSIS — D631 Anemia in chronic kidney disease: Secondary | ICD-10-CM | POA: Diagnosis not present

## 2017-11-03 DIAGNOSIS — N186 End stage renal disease: Secondary | ICD-10-CM | POA: Diagnosis not present

## 2017-11-03 DIAGNOSIS — Z992 Dependence on renal dialysis: Secondary | ICD-10-CM | POA: Diagnosis not present

## 2017-11-05 DIAGNOSIS — N186 End stage renal disease: Secondary | ICD-10-CM | POA: Diagnosis not present

## 2017-11-05 DIAGNOSIS — D631 Anemia in chronic kidney disease: Secondary | ICD-10-CM | POA: Diagnosis not present

## 2017-11-05 DIAGNOSIS — N2581 Secondary hyperparathyroidism of renal origin: Secondary | ICD-10-CM | POA: Diagnosis not present

## 2017-11-05 DIAGNOSIS — E1029 Type 1 diabetes mellitus with other diabetic kidney complication: Secondary | ICD-10-CM | POA: Diagnosis not present

## 2017-11-05 DIAGNOSIS — Z992 Dependence on renal dialysis: Secondary | ICD-10-CM | POA: Diagnosis not present

## 2017-11-07 DIAGNOSIS — E1029 Type 1 diabetes mellitus with other diabetic kidney complication: Secondary | ICD-10-CM | POA: Diagnosis not present

## 2017-11-07 DIAGNOSIS — Z992 Dependence on renal dialysis: Secondary | ICD-10-CM | POA: Diagnosis not present

## 2017-11-07 DIAGNOSIS — N186 End stage renal disease: Secondary | ICD-10-CM | POA: Diagnosis not present

## 2017-11-07 DIAGNOSIS — D631 Anemia in chronic kidney disease: Secondary | ICD-10-CM | POA: Diagnosis not present

## 2017-11-07 DIAGNOSIS — N2581 Secondary hyperparathyroidism of renal origin: Secondary | ICD-10-CM | POA: Diagnosis not present

## 2017-11-10 DIAGNOSIS — D631 Anemia in chronic kidney disease: Secondary | ICD-10-CM | POA: Diagnosis not present

## 2017-11-10 DIAGNOSIS — E1029 Type 1 diabetes mellitus with other diabetic kidney complication: Secondary | ICD-10-CM | POA: Diagnosis not present

## 2017-11-10 DIAGNOSIS — Z992 Dependence on renal dialysis: Secondary | ICD-10-CM | POA: Diagnosis not present

## 2017-11-10 DIAGNOSIS — N2581 Secondary hyperparathyroidism of renal origin: Secondary | ICD-10-CM | POA: Diagnosis not present

## 2017-11-10 DIAGNOSIS — N186 End stage renal disease: Secondary | ICD-10-CM | POA: Diagnosis not present

## 2017-11-10 NOTE — Progress Notes (Signed)
Established Intermittent Claudication   History of Present Illness   Patrick [name] is a 62 y.o. (1955-08-05) male w/ ESRD req HD who presents with chief complaint: post-op follow-up.    Prior procedures include: 1.  Failed TE LUA AVG (10/08/17), LIJV TDC placement 2.  TE LUA AVG (09/14/17) 3.  PTA+S L CIA and EIA (04/13/17) 4.  LUA AVG (07/07/16) 5.  RUA AVG (09/18/14)  Most recently this patient underwent a failed thrombectomy due to transected stents in the L central venous system.  Patient present today for evaluation new access.  Pt has had no difficulties with HD via [place]  In regards to his intermittent claudication.  His prior angiogram on 04/13/17 demonstrated bilateral multi-level disease that would require a fem-tib bypass for revascularization.   The patient's symptoms have not progressed.  The patient's symptoms are: stable intermittent claudication .  The patient's treatment regimen currently included: maximal medical management and walking plan.  The patient's PMH, PSH, SH, and FamHx were reviewed on 11/11/17 are unchanged from 10/08/17.  Current Outpatient Medications  Medication Sig Dispense Refill  . acetaminophen (TYLENOL) 500 MG tablet Take 1,000 mg by mouth every 6 (six) hours as needed for moderate pain or headache.    . albuterol (PROVENTIL HFA;VENTOLIN HFA) 108 (90 Base) MCG/ACT inhaler Inhale 1-2 puffs into the lungs every 6 (six) hours as needed for wheezing or shortness of breath.    Marland Kitchen amLODipine (NORVASC) 2.5 MG tablet Take 1 tablet (2.5 mg total) by mouth daily. 90 tablet 3  . aspirin EC 81 MG tablet Take 81 mg by mouth daily.    . calcium acetate (PHOSLO) 667 MG capsule Take 2,001 mg by mouth 3 (three) times daily with meals.     . calcium-vitamin D (OSCAL WITH D) 500-200 MG-UNIT tablet Take 1 tablet by mouth 2 (two) times a week.    . clopidogrel (PLAVIX) 75 MG tablet Take 1 tablet (75 mg total) by mouth daily. 30 tablet 11  . isosorbide mononitrate (IMDUR)  60 MG 24 hr tablet Take 1 tablet (60 mg total) by mouth daily. 90 tablet 3  . repaglinide (PRANDIN) 0.5 MG tablet Take 1 tablet (0.5 mg total) daily with supper by mouth. 90 tablet 3  . traMADol (ULTRAM) 50 MG tablet Take 1 tablet (50 mg total) by mouth every 6 (six) hours as needed for moderate pain. 8 tablet 0   No current facility-administered medications for this visit.     On ROS today: no gangrene, no rest pain.   Physical Examination   Vitals:   11/11/17 1205  BP: 103/74  Pulse: 85  Resp: 18  Temp: 98.1 F (36.7 C)  TempSrc: Oral  SpO2: 98%  Weight: 226 lb (102.5 kg)  Height: 5' 6.5" (1.689 m)   Body mass index is 35.93 kg/m.  LUE incision healed, no thrill, no bruit   Medical Decision Making   Patrick [name] is a 62 y.o. 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  I would check to see if there are any residual options in the arm.  Based on the patient's vascular studies and examination, I have offered the patient: R arm venogram.  I don't think L arm is  an option due to proximal stent in axillary vein. Risk of extremity and central venography include but are not limited to: anaphylaxis, bleeding, infection, and incomplete visualization of the venous system. The patient is aware of these risks and has elected to proceed.  He scheduled for the 22 APR 19.  In regards to his intermittent claudication, will get repeat studies in event no R side access option as part of thigh AVG evaluation  I discussed in depth with the patient the nature of atherosclerosis, and emphasized the importance of maximal medical management including strict control of blood pressure, blood glucose, and lipid levels, antiplatelet agents, obtaining  regular exercise, and cessation of smoking.    The patient is aware that without maximal medical management the underlying atherosclerotic disease process will progress, limiting the benefit of any interventions.  The patient is currently not on on statin as not medically indicated.   The patient is currently on an anti-platelet: ASA and Plavix.  Thank you for allowing [place] to participate in this patient's care.   Adele Barthel, MD, FACS [facility] Office: [phone] Pager: [phone]

## 2017-11-10 NOTE — H&P (View-Only) (Signed)
Established Intermittent Claudication   History of Present Illness   Patrick [name] is a 62 y.o. (Apr 24, 1956) male w/ ESRD req HD who presents with chief complaint: post-op follow-up.    Prior procedures include: 1.  Failed TE LUA AVG (10/08/17), LIJV TDC placement 2.  TE LUA AVG (09/14/17) 3.  PTA+S L CIA and EIA (04/13/17) 4.  LUA AVG (07/07/16) 5.  RUA AVG (09/18/14)  Most recently this patient underwent a failed thrombectomy due to transected stents in the L central venous system.  Patient present today for evaluation new access.  Pt has had no difficulties with HD via [place]  In regards to his intermittent claudication.  His prior angiogram on 04/13/17 demonstrated bilateral multi-level disease that would require a fem-tib bypass for revascularization.   The patient's symptoms have not progressed.  The patient's symptoms are: stable intermittent claudication .  The patient's treatment regimen currently included: maximal medical management and walking plan.  The patient's PMH, PSH, SH, and FamHx were reviewed on 11/11/17 are unchanged from 10/08/17.  Current Outpatient Medications  Medication Sig Dispense Refill  . acetaminophen (TYLENOL) 500 MG tablet Take 1,000 mg by mouth every 6 (six) hours as needed for moderate pain or headache.    . albuterol (PROVENTIL HFA;VENTOLIN HFA) 108 (90 Base) MCG/ACT inhaler Inhale 1-2 puffs into the lungs every 6 (six) hours as needed for wheezing or shortness of breath.    Marland Kitchen amLODipine (NORVASC) 2.5 MG tablet Take 1 tablet (2.5 mg total) by mouth daily. 90 tablet 3  . aspirin EC 81 MG tablet Take 81 mg by mouth daily.    . calcium acetate (PHOSLO) 667 MG capsule Take 2,001 mg by mouth 3 (three) times daily with meals.     . calcium-vitamin D (OSCAL WITH D) 500-200 MG-UNIT tablet Take 1 tablet by mouth 2 (two) times a week.    . clopidogrel (PLAVIX) 75 MG tablet Take 1 tablet (75 mg total) by mouth daily. 30 tablet 11  . isosorbide mononitrate (IMDUR)  60 MG 24 hr tablet Take 1 tablet (60 mg total) by mouth daily. 90 tablet 3  . repaglinide (PRANDIN) 0.5 MG tablet Take 1 tablet (0.5 mg total) daily with supper by mouth. 90 tablet 3  . traMADol (ULTRAM) 50 MG tablet Take 1 tablet (50 mg total) by mouth every 6 (six) hours as needed for moderate pain. 8 tablet 0   No current facility-administered medications for this visit.     On ROS today: no gangrene, no rest pain.   Physical Examination   Vitals:   11/11/17 1205  BP: 103/74  Pulse: 85  Resp: 18  Temp: 98.1 F (36.7 C)  TempSrc: Oral  SpO2: 98%  Weight: 226 lb (102.5 kg)  Height: 5' 6.5" (1.689 m)   Body mass index is 35.93 kg/m.  LUE incision healed, no thrill, no bruit   Medical Decision Making   Patrick [name] is a 62 y.o. 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  I would check to see if there are any residual options in the arm.  Based on the patient's vascular studies and examination, I have offered the patient: R arm venogram.  I don't think L arm is  an option due to proximal stent in axillary vein. Risk of extremity and central venography include but are not limited to: anaphylaxis, bleeding, infection, and incomplete visualization of the venous system. The patient is aware of these risks and has elected to proceed.  He scheduled for the 22 APR 19.  In regards to his intermittent claudication, will get repeat studies in event no R side access option as part of thigh AVG evaluation  I discussed in depth with the patient the nature of atherosclerosis, and emphasized the importance of maximal medical management including strict control of blood pressure, blood glucose, and lipid levels, antiplatelet agents, obtaining  regular exercise, and cessation of smoking.    The patient is aware that without maximal medical management the underlying atherosclerotic disease process will progress, limiting the benefit of any interventions.  The patient is currently not on on statin as not medically indicated.   The patient is currently on an anti-platelet: ASA and Plavix.  Thank you for allowing [place] to participate in this patient's care.   Adele Barthel, MD, FACS [facility] Office: [phone] Pager: [phone]

## 2017-11-11 ENCOUNTER — Encounter (HOSPITAL_COMMUNITY): Payer: Medicare Other

## 2017-11-11 ENCOUNTER — Other Ambulatory Visit (HOSPITAL_COMMUNITY): Payer: Medicare Other

## 2017-11-11 ENCOUNTER — Encounter: Payer: Self-pay | Admitting: *Deleted

## 2017-11-11 ENCOUNTER — Encounter: Payer: Self-pay | Admitting: Vascular Surgery

## 2017-11-11 ENCOUNTER — Ambulatory Visit (INDEPENDENT_AMBULATORY_CARE_PROVIDER_SITE_OTHER): Payer: Self-pay | Admitting: Vascular Surgery

## 2017-11-11 ENCOUNTER — Ambulatory Visit (HOSPITAL_COMMUNITY)
Admission: RE | Admit: 2017-11-11 | Discharge: 2017-11-11 | Disposition: A | Discharge status: Home / Self Care | Payer: Medicare Other | Source: Ambulatory Visit | Attending: Vascular Surgery | Admitting: Vascular Surgery

## 2017-11-11 ENCOUNTER — Encounter (HOSPITAL_COMMUNITY): Payer: Self-pay

## 2017-11-11 ENCOUNTER — Other Ambulatory Visit: Payer: Self-pay | Admitting: *Deleted

## 2017-11-11 VITALS — BP 103/74 | HR 85 | Temp 98.1°F | Resp 18 | Ht 66.5 in | Wt 226.0 lb

## 2017-11-11 DIAGNOSIS — N186 End stage renal disease: Secondary | ICD-10-CM

## 2017-11-11 DIAGNOSIS — T8619 Other complication of kidney transplant: Principal | ICD-10-CM

## 2017-11-11 DIAGNOSIS — Z992 Dependence on renal dialysis: Secondary | ICD-10-CM

## 2017-11-12 DIAGNOSIS — D631 Anemia in chronic kidney disease: Secondary | ICD-10-CM | POA: Diagnosis not present

## 2017-11-12 DIAGNOSIS — N2581 Secondary hyperparathyroidism of renal origin: Secondary | ICD-10-CM | POA: Diagnosis not present

## 2017-11-12 DIAGNOSIS — N186 End stage renal disease: Secondary | ICD-10-CM | POA: Diagnosis not present

## 2017-11-12 DIAGNOSIS — E1029 Type 1 diabetes mellitus with other diabetic kidney complication: Secondary | ICD-10-CM | POA: Diagnosis not present

## 2017-11-12 DIAGNOSIS — Z992 Dependence on renal dialysis: Secondary | ICD-10-CM | POA: Diagnosis not present

## 2017-11-13 ENCOUNTER — Ambulatory Visit: Payer: Medicare Other | Admitting: Vascular Surgery

## 2017-11-13 ENCOUNTER — Encounter (HOSPITAL_COMMUNITY): Payer: Medicare Other

## 2017-11-14 DIAGNOSIS — E1029 Type 1 diabetes mellitus with other diabetic kidney complication: Secondary | ICD-10-CM | POA: Diagnosis not present

## 2017-11-14 DIAGNOSIS — Z992 Dependence on renal dialysis: Secondary | ICD-10-CM | POA: Diagnosis not present

## 2017-11-14 DIAGNOSIS — N2581 Secondary hyperparathyroidism of renal origin: Secondary | ICD-10-CM | POA: Diagnosis not present

## 2017-11-14 DIAGNOSIS — N186 End stage renal disease: Secondary | ICD-10-CM | POA: Diagnosis not present

## 2017-11-14 DIAGNOSIS — D631 Anemia in chronic kidney disease: Secondary | ICD-10-CM | POA: Diagnosis not present

## 2017-11-16 ENCOUNTER — Encounter (HOSPITAL_COMMUNITY): Payer: Self-pay | Admitting: Vascular Surgery

## 2017-11-16 ENCOUNTER — Encounter (HOSPITAL_COMMUNITY)
Admission: RE | Disposition: A | Discharge status: Home / Self Care | Payer: Self-pay | Source: Ambulatory Visit | Attending: Vascular Surgery

## 2017-11-16 ENCOUNTER — Ambulatory Visit (HOSPITAL_COMMUNITY)
Admission: RE | Admit: 2017-11-16 | Discharge: 2017-11-16 | Disposition: A | Discharge status: Home / Self Care | Payer: Medicare Other | Source: Ambulatory Visit | Attending: Vascular Surgery | Admitting: Vascular Surgery

## 2017-11-16 DIAGNOSIS — Z79899 Other long term (current) drug therapy: Secondary | ICD-10-CM | POA: Diagnosis not present

## 2017-11-16 DIAGNOSIS — I739 Peripheral vascular disease, unspecified: Secondary | ICD-10-CM | POA: Insufficient documentation

## 2017-11-16 DIAGNOSIS — Z7982 Long term (current) use of aspirin: Secondary | ICD-10-CM | POA: Insufficient documentation

## 2017-11-16 DIAGNOSIS — N185 Chronic kidney disease, stage 5: Secondary | ICD-10-CM | POA: Diagnosis not present

## 2017-11-16 DIAGNOSIS — N186 End stage renal disease: Secondary | ICD-10-CM | POA: Diagnosis not present

## 2017-11-16 DIAGNOSIS — Z992 Dependence on renal dialysis: Secondary | ICD-10-CM | POA: Diagnosis not present

## 2017-11-16 DIAGNOSIS — Z9889 Other specified postprocedural states: Secondary | ICD-10-CM | POA: Insufficient documentation

## 2017-11-16 DIAGNOSIS — Z95820 Peripheral vascular angioplasty status with implants and grafts: Secondary | ICD-10-CM | POA: Diagnosis not present

## 2017-11-16 HISTORY — PX: UPPER EXTREMITY VENOGRAPHY: CATH118272

## 2017-11-16 LAB — POCT I-STAT, CHEM 8
BUN: 47 mg/dL — ABNORMAL HIGH (ref 6–20)
CHLORIDE: 101 mmol/L (ref 101–111)
Calcium, Ion: 0.98 mmol/L — ABNORMAL LOW (ref 1.15–1.40)
Creatinine, Ser: 12.8 mg/dL — ABNORMAL HIGH (ref 0.61–1.24)
Glucose, Bld: 123 mg/dL — ABNORMAL HIGH (ref 65–99)
HCT: 34 % — ABNORMAL LOW (ref 39.0–52.0)
Hemoglobin: 11.6 g/dL — ABNORMAL LOW (ref 13.0–17.0)
Potassium: 4 mmol/L (ref 3.5–5.1)
SODIUM: 137 mmol/L (ref 135–145)
TCO2: 24 mmol/L (ref 22–32)

## 2017-11-16 SURGERY — UPPER EXTREMITY VENOGRAPHY
Anesthesia: LOCAL

## 2017-11-16 MED ORDER — ONDANSETRON HCL 4 MG/2ML IJ SOLN: 4.0000 mg | Freq: Four times a day (QID) | INTRAMUSCULAR | Status: DC | PRN

## 2017-11-16 MED ORDER — SODIUM CHLORIDE 0.9% FLUSH: 3.0000 mL | Freq: Two times a day (BID) | INTRAVENOUS | Status: DC

## 2017-11-16 MED ORDER — HEPARIN (PORCINE) IN NACL 1000-0.9 UT/500ML-% IV SOLN
INTRAVENOUS | Status: AC
  Filled 2017-11-16: qty 500

## 2017-11-16 MED ORDER — DIPHENHYDRAMINE HCL 50 MG/ML IJ SOLN
25.0000 mg | INTRAMUSCULAR | Status: AC
  Administered 2017-11-16: 25 mg via INTRAVENOUS

## 2017-11-16 MED ORDER — DIPHENHYDRAMINE HCL 50 MG/ML IJ SOLN
INTRAMUSCULAR | Status: AC
  Administered 2017-11-16: 25 mg via INTRAVENOUS
  Filled 2017-11-16: qty 1

## 2017-11-16 MED ORDER — METHYLPREDNISOLONE SODIUM SUCC 125 MG IJ SOLR
125.0000 mg | INTRAMUSCULAR | Status: AC
  Administered 2017-11-16: 62.5 mg via INTRAVENOUS

## 2017-11-16 MED ORDER — SODIUM CHLORIDE 0.9 % IV SOLN: 250.0000 mL | INTRAVENOUS | Status: DC | PRN

## 2017-11-16 MED ORDER — SODIUM CHLORIDE 0.9% FLUSH: 3.0000 mL | INTRAVENOUS | Status: DC | PRN

## 2017-11-16 MED ORDER — HEPARIN (PORCINE) IN NACL 2-0.9 UNITS/ML
INTRAMUSCULAR | Status: AC | PRN
  Administered 2017-11-16: 500 mL

## 2017-11-16 MED ORDER — METHYLPREDNISOLONE SODIUM SUCC 125 MG IJ SOLR
INTRAMUSCULAR | Status: AC
  Administered 2017-11-16: 62.5 mg via INTRAVENOUS
  Filled 2017-11-16: qty 2

## 2017-11-16 MED ORDER — FAMOTIDINE IN NACL 20-0.9 MG/50ML-% IV SOLN
20.0000 mg | INTRAVENOUS | Status: AC
  Administered 2017-11-16: 20 mg via INTRAVENOUS

## 2017-11-16 MED ORDER — HYDRALAZINE HCL 20 MG/ML IJ SOLN: 5.0000 mg | INTRAMUSCULAR | Status: DC | PRN

## 2017-11-16 MED ORDER — IODIXANOL 320 MG/ML IV SOLN
INTRAVENOUS | Status: DC | PRN
  Administered 2017-11-16: 30 mL via INTRAVENOUS

## 2017-11-16 MED ORDER — ACETAMINOPHEN 325 MG PO TABS: 650.0000 mg | ORAL_TABLET | ORAL | Status: DC | PRN

## 2017-11-16 MED ORDER — FAMOTIDINE IN NACL 20-0.9 MG/50ML-% IV SOLN
INTRAVENOUS | Status: AC
  Administered 2017-11-16: 20 mg via INTRAVENOUS
  Filled 2017-11-16: qty 50

## 2017-11-16 SURGICAL SUPPLY — 9 items
BAG SNAP BAND KOVER 36X36 (MISCELLANEOUS) IMPLANT
CATH OMNI FLUSH 5F 65CM (CATHETERS) IMPLANT
COVER DOME SNAP 22 D (MISCELLANEOUS) IMPLANT
KIT PV (KITS) IMPLANT
SHEATH AVANTI 11CM 5FR (SHEATH) IMPLANT
TRANSDUCER W/STOPCOCK (MISCELLANEOUS) IMPLANT
TRAY PV CATH (CUSTOM PROCEDURE TRAY) IMPLANT
TUBING CIL FLEX 10 FLL-RA (TUBING) ×2 IMPLANT
WIRE BENTSON .035X145CM (WIRE) IMPLANT

## 2017-11-16 NOTE — Discharge Instructions (Signed)
Venogram, Care After °This sheet gives you information about how to care for yourself after your procedure. Your health care provider may also give you more specific instructions. If you have problems or questions, contact your health care provider. °What can I expect after the procedure? °After the procedure, it is common to have: °· Bruising or mild discomfort in the area where the IV was inserted (insertion site). ° °Follow these instructions at home: °Eating and drinking °· Follow instructions from your health care provider about eating or drinking restrictions. °· Drink a lot of fluids for the first several days after the procedure, as directed by your health care provider. This helps to wash (flush) the contrast out of your body. Examples of healthy fluids include water or low-calorie drinks. °General instructions °· Check your IV insertion area every day for signs of infection. Check for: °? Redness, swelling, or pain. °? Fluid or blood. °? Warmth. °? Pus or a bad smell. °· Take over-the-counter and prescription medicines only as told by your health care provider. °· Rest and return to your normal activities as told by your health care provider. Ask your health care provider what activities are safe for you. °· Do not drive for 24 hours if you were given a medicine to help you relax (sedative), or until your health care provider approves. °· Keep all follow-up visits as told by your health care provider. This is important. °Contact a health care provider if: °· Your skin becomes itchy or you develop a rash or hives. °· You have a fever that does not get better with medicine. °· You feel nauseous. °· You vomit. °· You have redness, swelling, or pain around the insertion site. °· You have fluid or blood coming from the insertion site. °· Your insertion area feels warm to the touch. °· You have pus or a bad smell coming from the insertion site. °Get help right away if: °· You have difficulty breathing or  shortness of breath. °· You develop chest pain. °· You faint. °· You feel very dizzy. °These symptoms may represent a serious problem that is an emergency. Do not wait to see if the symptoms will go away. Get medical help right away. Call your local emergency services (911 in the [place] Do not drive yourself to the hospital. °Summary °· After your procedure, it is common to have bruising or mild discomfort in the area where the IV was inserted. °· You should check your IV insertion area every day for signs of infection. °· Take over-the-counter and prescription medicines only as told by your health care provider. °· You should drink a lot of fluids for the first several days after the procedure to help flush the contrast from your body. °This information is not intended to replace advice given to you by your health care provider. Make sure you discuss any questions you have with your health care provider. °Document Released: 05/04/2013 Document Revised: 06/07/2016 Document Reviewed: 06/07/2016 °Elsevier Interactive Patient Education © 2017 [employer] ° °

## 2017-11-16 NOTE — Interval H&P Note (Signed)
History and Physical Interval Note:  11/16/2017 7:21 AM  Patrick [name]  has presented today for surgery, with the diagnosis of instage renal  The various methods of treatment have been discussed with the patient and family. After consideration of risks, benefits and other options for treatment, the patient has consented to  Procedure(s): UPPER EXTREMITY VENOGRAPHY - Right Arm (N/A) as a surgical intervention .  The patient's history has been reviewed, patient examined, no change in status, stable for surgery.  I have reviewed the patient's chart and labs.  Questions were answered to the patient's satisfaction.     Adele Barthel

## 2017-11-16 NOTE — Op Note (Signed)
    OPERATIVE NOTE   PROCEDURE: 1.  right arm and central venogram   PRE-OPERATIVE DIAGNOSIS: end stage renal disease  POST-OPERATIVE DIAGNOSIS: same as above   SURGEON: Adele Barthel, MD  ANESTHESIA: local  ESTIMATED BLOOD LOSS: 5 cc  FINDING(S): 1. Patent single brachial vein to subclavian vein   2. Subclavian vein drains into internal jugular vein  3. No drainage into right innominate vein  SPECIMEN(S):  None  CONTRAST: 30 cc  INDICATIONS: Patrick [name] is a 62 y.o. male who presents with end stage renal disease.  The patient is scheduled for right arm and central venogram to help determine the availability of proximal veins for permanent access placement.  The patient is aware the risks include but are not limited to: bleeding, infection, thrombosis of the cannulated access, and possible anaphylactic reaction to the contrast.  The patient is aware of the risks of the procedure and elects to proceed forward.   DESCRIPTION: After full informed written consent was obtained, the patient was brought back to the angiography suite and placed supine upon the angiography table.  The patient was connected to monitoring equipment.  The right antecubital IV was connected to IV extension tubing.  Hand injections were completed to image the arm veins and central venous structures, the findings of which are listed above.  Based on the images, no right arm access is possible.   COMPLICATIONS: none  CONDITION: stable   Adele Barthel, MD, [facility] [place] Office: [phone] Pager: [phone]  11/16/2017 9:43 AM

## 2017-11-16 NOTE — Progress Notes (Signed)
Patient in stable condition with no issues or complaints.  Patient discharging to home with wife.  Patient denies symptoms of contrast allergy at this time.  Instructed to call 911 if symptoms appear during discharge instructions.

## 2017-11-17 DIAGNOSIS — Z992 Dependence on renal dialysis: Secondary | ICD-10-CM | POA: Diagnosis not present

## 2017-11-17 DIAGNOSIS — D631 Anemia in chronic kidney disease: Secondary | ICD-10-CM | POA: Diagnosis not present

## 2017-11-17 DIAGNOSIS — E1029 Type 1 diabetes mellitus with other diabetic kidney complication: Secondary | ICD-10-CM | POA: Diagnosis not present

## 2017-11-17 DIAGNOSIS — N186 End stage renal disease: Secondary | ICD-10-CM | POA: Diagnosis not present

## 2017-11-17 DIAGNOSIS — N2581 Secondary hyperparathyroidism of renal origin: Secondary | ICD-10-CM | POA: Diagnosis not present

## 2017-11-19 DIAGNOSIS — Z992 Dependence on renal dialysis: Secondary | ICD-10-CM | POA: Diagnosis not present

## 2017-11-19 DIAGNOSIS — N2581 Secondary hyperparathyroidism of renal origin: Secondary | ICD-10-CM | POA: Diagnosis not present

## 2017-11-19 DIAGNOSIS — E1029 Type 1 diabetes mellitus with other diabetic kidney complication: Secondary | ICD-10-CM | POA: Diagnosis not present

## 2017-11-19 DIAGNOSIS — D631 Anemia in chronic kidney disease: Secondary | ICD-10-CM | POA: Diagnosis not present

## 2017-11-19 DIAGNOSIS — N186 End stage renal disease: Secondary | ICD-10-CM | POA: Diagnosis not present

## 2017-11-19 DIAGNOSIS — I4891 Unspecified atrial fibrillation: Secondary | ICD-10-CM | POA: Diagnosis not present

## 2017-11-21 DIAGNOSIS — D631 Anemia in chronic kidney disease: Secondary | ICD-10-CM | POA: Diagnosis not present

## 2017-11-21 DIAGNOSIS — N2581 Secondary hyperparathyroidism of renal origin: Secondary | ICD-10-CM | POA: Diagnosis not present

## 2017-11-21 DIAGNOSIS — E1029 Type 1 diabetes mellitus with other diabetic kidney complication: Secondary | ICD-10-CM | POA: Diagnosis not present

## 2017-11-21 DIAGNOSIS — Z992 Dependence on renal dialysis: Secondary | ICD-10-CM | POA: Diagnosis not present

## 2017-11-21 DIAGNOSIS — N186 End stage renal disease: Secondary | ICD-10-CM | POA: Diagnosis not present

## 2017-11-24 DIAGNOSIS — N2581 Secondary hyperparathyroidism of renal origin: Secondary | ICD-10-CM | POA: Diagnosis not present

## 2017-11-24 DIAGNOSIS — Z992 Dependence on renal dialysis: Secondary | ICD-10-CM | POA: Diagnosis not present

## 2017-11-24 DIAGNOSIS — D631 Anemia in chronic kidney disease: Secondary | ICD-10-CM | POA: Diagnosis not present

## 2017-11-24 DIAGNOSIS — E1029 Type 1 diabetes mellitus with other diabetic kidney complication: Secondary | ICD-10-CM | POA: Diagnosis not present

## 2017-11-24 DIAGNOSIS — N186 End stage renal disease: Secondary | ICD-10-CM | POA: Diagnosis not present

## 2017-11-25 DIAGNOSIS — Z992 Dependence on renal dialysis: Secondary | ICD-10-CM | POA: Diagnosis not present

## 2017-11-25 DIAGNOSIS — T8610 Unspecified complication of kidney transplant: Secondary | ICD-10-CM | POA: Diagnosis not present

## 2017-11-25 DIAGNOSIS — N186 End stage renal disease: Secondary | ICD-10-CM | POA: Diagnosis not present

## 2017-11-26 DIAGNOSIS — E1029 Type 1 diabetes mellitus with other diabetic kidney complication: Secondary | ICD-10-CM | POA: Diagnosis not present

## 2017-11-26 DIAGNOSIS — D509 Iron deficiency anemia, unspecified: Secondary | ICD-10-CM | POA: Diagnosis not present

## 2017-11-26 DIAGNOSIS — D631 Anemia in chronic kidney disease: Secondary | ICD-10-CM | POA: Diagnosis not present

## 2017-11-26 DIAGNOSIS — N2581 Secondary hyperparathyroidism of renal origin: Secondary | ICD-10-CM | POA: Diagnosis not present

## 2017-11-26 DIAGNOSIS — N186 End stage renal disease: Secondary | ICD-10-CM | POA: Diagnosis not present

## 2017-11-26 DIAGNOSIS — Z992 Dependence on renal dialysis: Secondary | ICD-10-CM | POA: Diagnosis not present

## 2017-11-26 DIAGNOSIS — D689 Coagulation defect, unspecified: Secondary | ICD-10-CM | POA: Diagnosis not present

## 2017-11-27 ENCOUNTER — Encounter: Payer: Self-pay | Admitting: Physician Assistant

## 2017-11-27 ENCOUNTER — Ambulatory Visit (INDEPENDENT_AMBULATORY_CARE_PROVIDER_SITE_OTHER): Payer: Medicare Other | Admitting: Physician Assistant

## 2017-11-27 VITALS — BP 138/82 | HR 80 | Ht 66.5 in | Wt 229.8 lb

## 2017-11-27 DIAGNOSIS — I10 Essential (primary) hypertension: Secondary | ICD-10-CM

## 2017-11-27 DIAGNOSIS — I25119 Atherosclerotic heart disease of native coronary artery with unspecified angina pectoris: Secondary | ICD-10-CM

## 2017-11-27 DIAGNOSIS — I5032 Chronic diastolic (congestive) heart failure: Secondary | ICD-10-CM

## 2017-11-27 DIAGNOSIS — T50995D Adverse effect of other drugs, medicaments and biological substances, subsequent encounter: Secondary | ICD-10-CM

## 2017-11-27 DIAGNOSIS — I2510 Atherosclerotic heart disease of native coronary artery without angina pectoris: Secondary | ICD-10-CM | POA: Insufficient documentation

## 2017-11-27 DIAGNOSIS — T8619 Other complication of kidney transplant: Secondary | ICD-10-CM

## 2017-11-27 DIAGNOSIS — N186 End stage renal disease: Secondary | ICD-10-CM | POA: Diagnosis not present

## 2017-11-27 DIAGNOSIS — Z940 Kidney transplant status: Secondary | ICD-10-CM | POA: Diagnosis not present

## 2017-11-27 MED ORDER — PREDNISONE 50 MG PO TABS: ORAL_TABLET | ORAL | 0 refills | Status: DC

## 2017-11-27 MED ORDER — DIPHENHYDRAMINE HCL 50 MG PO TABS: 50.0000 mg | ORAL_TABLET | Freq: Once | ORAL | 0 refills | Status: DC

## 2017-11-27 MED ORDER — AMLODIPINE BESYLATE 5 MG PO TABS: 5.0000 mg | ORAL_TABLET | Freq: Every day | ORAL | 3 refills | Status: DC

## 2017-11-27 NOTE — Progress Notes (Signed)
Cardiology Office Note:    Date:  11/27/2017   ID:  Varney Daily, DOB [birth year], MRN [id number]  PCP:  Nolene Ebbs, MD  Cardiologist:  Dorris Carnes, MD  Electrophysiologist: Allegra Lai, MD  Referring MD: Nolene Ebbs, MD   Chief Complaint  Patient presents with  . Follow-up    CAD/Chest pain    History of Present Illness:    Patrick [name] is a 62 y.o. 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  In 2007, he developed reduced LV function with EF 30% and high output heart failure attributed to simultaneous left and right AV fistulas for dialysis.  His right AV fistula was closed and he had resolution of symptoms and recovery of LV function.  Cardiac catheterization in February 2007 demonstrated normal left main, LAD, LCx and mid RCA 25%.  LIMA-LAD was atretic.  He had an abnormal Myoview in 2015 at [facility] with apical, apical anterior, lateral, apical, inferoapical ischemia.  Cardiac catheterization was deferred at that time secondary to worsening renal function.  His chart indicates he had a heart catheterization sometime in 2016 (records not available).  Stress testing with Dr. Einar Gip in 2016 demonstrated a very small area of inferior ischemia.  The study was low risk and he was treated medically.   He has recently been seen for chest discomfort.  Nuclear stress test in February 2019 demonstrated anteroseptal, apical anterior, apical septal, apical lateral, apical scar but no ischemia.  Medical therapy was recommended.  He was last seen in clinic by me in March 2019.  He complained of continued chest discomfort described as tightness occurring with exertion.  I placed him on amlodipine to maximize antianginal therapy.  I reviewed his symptoms with Dr. [name].  It was  felt that if he continued to have chest discomfort despite maximal antianginal therapy, we should consider proceeding with cardiac catheterization.  Mr. Routon returns for follow-up.  He is here alone.  He notes no significant change in his symptoms.  He describes chest pressure/tightness with radiation to his left arm with mild to moderate activity.  He notes chest discomfort with sexual intercourse as well as with walking 1-2 blocks.  It sometimes wakes him up.  He denies significant shortness of breath.  He denies PND, edema, syncope.  He has not tried taking any nitroglycerin.  Prior CV studies:   The following studies were reviewed today:  Nuclear stress test 09/02/17 EF 53, anteroseptal/apical anterior/apical septal/apical lateral/apical scar, no ischemia  Echo 01/25/16 Mild concentric LVH, EF 45-50, diffuse HK, grade 2 diastolic dysfunction, very mild aortic stenosis (mean 13, peak 26), MAC, mild MR, mild LAE  Nuclear stress test 01/08/15 (Dr. Einar Gip) EF 55, very small area of ischemia in the basal inferior, inferoapical and apical septal regions; low risk  Echo 11/29/13 Mild LVH, EF 94-07, grade 1 diastolic dysfunction, MAC, mild LAE  Nuclear stress test 10/10/13 ([facility])  FINAL COMMENTS  - Moderate apical and periapical (i.e. apex, apical anterior/ lateral/ inferior segments) ischemia status post regadenoson stress.  - LVEF= 63%. Septal dyskinesis in a post- CABG pattern. Otherwise normal systolic function.  - Mild RV uptake, suggestive of RV hypertrophy.  Past Medical History:  Diagnosis Date  . Arthritis    "back" (09/24/2016)  . ESRD (end stage renal disease) on  dialysis [facility])    "TTS; [place]" (09/24/2016)  . Gout   . History of blood transfusion 2009- 2016   "I've had several; low HgB"  . History of kidney stones    treated with nephrectomy  . Hypertension   . Impotence of organic origin   . Migraine    "stopped in my 30's; they were related to high BP" (09/24/2016)    . Myocardial infarction [facility]) '96  . Pneumonia 01/2015   " a touch & I was in the hosp."  . Secondary hyperparathyroidism ([place])   . Type 2 diabetes mellitus (HCC)    no longer on medication since going on dialysis   Surgical Hx: The patient  has a past surgical history that includes Kidney transplant (2009); Appendectomy; AV fistula placement (Right, 09/18/2014); ir generic historical (05/11/2016); ir generic historical (05/11/2016); ir generic historical (05/11/2016); ir generic historical (05/11/2016); Cardiac catheterization (N/A, 06/04/2016); AV fistula placement (Left, 07/07/2016); Hernia repair (2017); Nephrectomy (2017); Cardiac catheterization (~ 2016); A-FLUTTER ABLATION (N/A, 09/24/2016); Coronary artery bypass graft (1997); ABDOMINAL AORTOGRAM W/LOWER EXTREMITY (N/A, 04/13/2017); PERIPHERAL VASCULAR INTERVENTION (04/13/2017); Thrombectomy w/ embolectomy (Left, 09/14/2017); Thrombectomy and revision of arterioventous (av) goretex  graft (Left, 10/08/2017); Insertion of dialysis catheter (N/A, 10/08/2017); and UPPER EXTREMITY VENOGRAPHY (N/A, 11/16/2017).   Current Medications: Current Meds  Medication Sig  . acetaminophen (TYLENOL) 500 MG tablet Take 1,000 mg by mouth every 6 (six) hours as needed for moderate pain or headache.  . albuterol (PROVENTIL HFA;VENTOLIN HFA) 108 (90 Base) MCG/ACT inhaler Inhale 1-2 puffs into the lungs every 6 (six) hours as needed for wheezing or shortness of breath.  Marland Kitchen aspirin EC 81 MG tablet Take 81 mg by mouth daily.  . calcium acetate (PHOSLO) 667 MG capsule Take 1,334 mg by mouth 3 (three) times daily with meals.   . calcium-vitamin D (OSCAL WITH D) 500-200 MG-UNIT tablet Take 1 tablet by mouth 2 (two) times a week.  . clopidogrel (PLAVIX) 75 MG tablet Take 1 tablet (75 mg total) by mouth daily.  . repaglinide (PRANDIN) 0.5 MG tablet Take 1 tablet (0.5 mg total) daily with supper by mouth. (Patient taking differently: Take 0.5 mg by mouth 2 (two) times a week.  )  . traMADol (ULTRAM) 50 MG tablet Take 1 tablet (50 mg total) by mouth every 6 (six) hours as needed for moderate pain.  . [DISCONTINUED] amLODipine (NORVASC) 2.5 MG tablet Take 1 tablet (2.5 mg total) by mouth daily.     Allergies:   Lipitor [atorvastatin]; Metoprolol; and Iodinated diagnostic agents   Social History   Tobacco Use  . Smoking status: Former Smoker    Types: Cigarettes    Last attempt to quit: 07/04/2014    Years since quitting: 3.4  . Smokeless tobacco: Never Used  . Tobacco comment: "smoked ~ 1 pack/month when I did smoke; never a steady smoker"  Substance Use Topics  . Alcohol use: Yes    Alcohol/week: 0.0 oz    Comment: 09/24/2016 "2 drinks, 1-2 times/year"  . Drug use: No     Family Hx: The patient's family history includes Hyperlipidemia in his mother; Hypertension in his mother.  ROS:   Please see the history of present illness.    Review of Systems  Cardiovascular: Positive for chest pain.   All other systems reviewed and are negative.   EKGs/Labs/Other Test Reviewed:    EKG:  EKG is  ordered today.  The ekg ordered today demonstrates normal sinus rhythm, heart rate 80, normal  axis, T wave inversions 1, aVL, V3-V6, QTC 463 ms, no change since tracing dated 08/07/2017  Recent Labs: 07/10/2017: Platelets 196 11/16/2017: BUN 47; Creatinine, Ser 12.80; Hemoglobin 11.6; Potassium 4.0; Sodium 137   Recent Lipid Panel Lab Results  Component Value Date/Time   CHOL 192 05/25/2016 03:42 PM   TRIG 249 (H) 05/25/2016 03:42 PM   HDL 34 (L) 05/25/2016 03:42 PM   CHOLHDL 5.6 05/25/2016 03:42 PM   LDLCALC 108 (H) 05/25/2016 03:42 PM    Physical Exam:    VS:  BP 138/82 (BP Location: Right Arm, Patient Position: Sitting, Cuff Size: Normal)   Pulse 80   Ht 5' 6.5" (1.689 m)   Wt 229 lb 12.8 oz (104.2 kg)   BMI 36.54 kg/m     Wt Readings from Last 3 Encounters:  11/27/17 229 lb 12.8 oz (104.2 kg)  11/16/17 228 lb (103.4 kg)  11/11/17 226 lb (102.5 kg)      Physical Exam  Constitutional: He is oriented to person, place, and time. He appears well-developed and well-nourished. No distress.  HENT:  Head: Normocephalic and atraumatic.  Neck: Neck supple. No JVD present.  Cardiovascular: Normal rate, regular rhythm, S1 normal and S2 normal.  No murmur heard. Pulmonary/Chest: Breath sounds normal. He has no rales.  Abdominal: Soft. There is no hepatomegaly.  Musculoskeletal: He exhibits no edema.  Neurological: He is alert and oriented to person, place, and time.  Skin: Skin is warm and dry.    ASSESSMENT & PLAN:    Coronary artery disease involving native coronary artery of native heart with angina pectoris ([place]) History of remote CABG.  He had a nuclear stress test in February 2019 that demonstrated scar but no ischemia.  However, he continues to have exertional chest discomfort consistent with angina.  He describes CCS class III symptoms.  As noted, I have discussed his case previously with Dr. [name].  I have recommended proceeding with cardiac catheterization for further evaluation and treatment.  Risks and benefits of cardiac catheterization have been discussed with the patient.  These include bleeding, infection, kidney damage, stroke, heart attack, death.  The patient understands these risks and is willing to proceed.   -Continue current dose of isosorbide, clopidogrel, aspirin  -Increase amlodipine to 5 mg daily  -Arrange cardiac catheterization around his dialysis  Chronic diastolic CHF (congestive heart failure) ([place]) Primarily diastolic heart failure.  His ejection fraction was 30% in 2007.  Most recent echo in June 2017 demonstrated EF 45-50% and EF was 53% of most recent nuclear stress test.  Volume management per dialysis.  End-stage renal failure with renal transplant [facility]) He goes to dialysis every Tuesday, Thursday, Saturday.  Essential hypertension Blood pressure close to target.  Adjust amlodipine as noted.  Allergy to  IVP dye, subsequent encounter He has a history of IV dye allergy.  He will be managed with a 12-hour premedication regimen.   Dispo:  Return in about 3 weeks (around 12/18/2017) for Post Procedure Follow Up, w/ Dr. [name], or Richardson Dopp, PA-C.   Medication Adjustments/Labs and Tests Ordered: Current medicines are reviewed at length with the patient today.  Concerns regarding medicines are outlined above.  Tests Ordered: Orders Placed This Encounter  Procedures  . EKG 12-Lead   Medication Changes: Meds ordered this encounter  Medications  . amLODipine (NORVASC) 5 MG tablet    Sig: Take 1 tablet (5 mg total) by mouth daily.    Dispense:  90 tablet    Refill:  3    DOSE INCREASE  . predniSONE (DELTASONE) 50 MG tablet    Sig: Take 12 hours prior to cath, 6 hours prior and 1 hour prior    Dispense:  3 tablet    Refill:  0  . diphenhydrAMINE (BENADRYL) 50 MG tablet    Sig: Take 1 tablet (50 mg total) by mouth once for 1 dose.    Dispense:  1 tablet    Refill:  0    This is for cath, pt has IVP dye allergy    Signed, Richardson Dopp, PA-C  11/27/2017 1:19 PM    [facility] [place], [place], [place]  [address] Phone: [phone]; Fax: [phone]

## 2017-11-27 NOTE — Patient Instructions (Addendum)
Medication Instructions:  1. INCREASE AMLODIPINE TO 5 MG DAILY; NEW RX HAS BEEN SENT IN   Labwork: YOU HAVE BEEN GIVEN AN RX TO HAVE LAB WORK DONE AT DIALYSIS ON Tuesday Dec 01, 2017; PLEASE HAVE THE RESULTS FAXED TO [facility] [place]  Testing/Procedures: Your physician has requested that you have a cardiac catheterization. Cardiac catheterization is used to diagnose and/or treat various heart conditions. Doctors may recommend this procedure for a number of different reasons. The most common reason is to evaluate chest pain. Chest pain can be a symptom of coronary artery disease (CAD), and cardiac catheterization can show whether plaque is narrowing or blocking your heart's arteries. This procedure is also used to evaluate the valves, as well as measure the blood flow and oxygen levels in different parts of your heart. For further information please visit [email or web address]. Please follow instruction sheet, as given.    Follow-Up: Dec 18, 2017 @ 11:15 AM WITH SCOTT WEAVER, PAC POST PROCEDURE FOLLOW UP   Any Other Special Instructions Will Be Listed Below (If Applicable).  If you need a refill on your cardiac medications before your next appointment, please call your pharmacy.    [place] OFF[facility] [address] [place] [address] [place] [address] Dept: [phone] Loc: [place]  11/27/2017  You are scheduled for a Cardiac Catheterization on Friday, May 10 with Dr. Daneen Schick AT 12 PM (NOON TIME).  1. Please arrive at the [facility] (Main Entrance A) at [facility]: [place], [place] [address] at 10:00 AM (two hours before your procedure to ensure your preparation). Free valet parking service is available.   Special note: Every effort is made to have your procedure done on time. Please understand that emergencies sometimes delay scheduled  procedures.  2. Diet: You may have a clear liquid breakfast, but nothing after 8 AM on your procedure day.  3. Labs: YOU HAVE BEEN GIVEN AN RX FOR LAB WORK TO BE DONE AT DIALYSIS ON Dec 01, 2017 Tuesday  4. Medication instructions in preparation for your procedure: On the morning of your procedure, take your Aspirin 81 MG AND PLAVIX 75 MG  and any morning medicines NOT listed above.  You may use sips of water.  DUE TO YOU HAVE A DYE ALLERGY YOU WILL NEED TO TAKE PREDNISONE 12 HOURS PRIOR TO CATH 6 HOURS BEFORE CATH AND TAKE RIGHT BEFORE LEAVING YOUR HOUSE ; TAKE BENADRYL WHEN YOU GET TO [place] MORNING OF CATH   5. Plan for one night stay--bring personal belongings. 6. Bring a current list of your medications and current insurance cards. 7. You MUST have a responsible person to drive you home. 8. Someone MUST be with you the first 24 hours after you arrive home or your discharge will be delayed. 9. Please wear clothes that are easy to get on and off and wear slip-on shoes.  Thank you for allowing [place] to care for you!   -- [facility]

## 2017-11-27 NOTE — H&P (View-Only) (Signed)
Cardiology Office Note:    Date:  11/27/2017   ID:  Patrick [name], DOB 11/03/1955, MRN [id number]  PCP:  Avbuere, Edwin, MD  Cardiologist:  Paula Ross, MD  Electrophysiologist: Will Camnitz, MD  Referring MD: Avbuere, Edwin, MD   Chief Complaint  Patient presents with  . Follow-up    CAD/Chest pain    History of Present Illness:    Patrick [name] is a 62 y.o. 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  In 2007, he developed reduced LV function with EF 30% and high output heart failure attributed to simultaneous left and right AV fistulas for dialysis.  His right AV fistula was closed and he had resolution of symptoms and recovery of LV function.  Cardiac catheterization in February 2007 demonstrated normal left main, LAD, LCx and mid RCA 25%.  LIMA-LAD was atretic.  He had an abnormal Myoview in 2015 at [facility] with apical, apical anterior, lateral, apical, inferoapical ischemia.  Cardiac catheterization was deferred at that time secondary to worsening renal function.  His chart indicates he had a heart catheterization sometime in 2016 (records not available).  Stress testing with Dr. Ganji in 2016 demonstrated a very small area of inferior ischemia.  The study was low risk and he was treated medically.   He has recently been seen for chest discomfort.  Nuclear stress test in February 2019 demonstrated anteroseptal, apical anterior, apical septal, apical lateral, apical scar but no ischemia.  Medical therapy was recommended.  He was last seen in clinic by me in March 2019.  He complained of continued chest discomfort described as tightness occurring with exertion.  I placed him on amlodipine to maximize antianginal therapy.  I reviewed his symptoms with Dr. Ross.  It was  felt that if he continued to have chest discomfort despite maximal antianginal therapy, we should consider proceeding with cardiac catheterization.  [name] returns for follow-up.  He is here alone.  He notes no significant change in his symptoms.  He describes chest pressure/tightness with radiation to his left arm with mild to moderate activity.  He notes chest discomfort with sexual intercourse as well as with walking 1-2 blocks.  It sometimes wakes him up.  He denies significant shortness of breath.  He denies PND, edema, syncope.  He has not tried taking any nitroglycerin.  Prior CV studies:   The following studies were reviewed today:  Nuclear stress test 09/02/17 EF 53, anteroseptal/apical anterior/apical septal/apical lateral/apical scar, no ischemia  Echo 01/25/16 Mild concentric LVH, EF 45-50, diffuse HK, grade 2 diastolic dysfunction, very mild aortic stenosis (mean 13, peak 26), MAC, mild MR, mild LAE  Nuclear stress test 01/08/15 (Dr. Ganji) EF 55, very small area of ischemia in the basal inferior, inferoapical and apical septal regions; low risk  Echo 11/29/13 Mild LVH, EF 50-55, grade 1 diastolic dysfunction, MAC, mild LAE  Nuclear stress test 10/10/13 ([facility])  FINAL COMMENTS  - Moderate apical and periapical (i.e. apex, apical anterior/ lateral/ inferior segments) ischemia status post regadenoson stress.  - LVEF= 63%. Septal dyskinesis in a post- CABG pattern. Otherwise normal systolic function.  - Mild RV uptake, suggestive of RV hypertrophy.  Past Medical History:  Diagnosis Date  . Arthritis    "back" (09/24/2016)  . ESRD (end stage renal disease) on   dialysis [facility])    "TTS; [place]" (09/24/2016)  . Gout   . History of blood transfusion 2009- 2016   "I've had several; low HgB"  . History of kidney stones    treated with nephrectomy  . Hypertension   . Impotence of organic origin   . Migraine    "stopped in my 30's; they were related to high BP" (09/24/2016)    . Myocardial infarction [facility]) '96  . Pneumonia 01/2015   " a touch & I was in the hosp."  . Secondary hyperparathyroidism ([place])   . Type 2 diabetes mellitus (HCC)    no longer on medication since going on dialysis   Surgical Hx: The patient  has a past surgical history that includes Kidney transplant (2009); Appendectomy; AV fistula placement (Right, 09/18/2014); ir generic historical (05/11/2016); ir generic historical (05/11/2016); ir generic historical (05/11/2016); ir generic historical (05/11/2016); Cardiac catheterization (N/A, 06/04/2016); AV fistula placement (Left, 07/07/2016); Hernia repair (2017); Nephrectomy (2017); Cardiac catheterization (~ 2016); A-FLUTTER ABLATION (N/A, 09/24/2016); Coronary artery bypass graft (1997); ABDOMINAL AORTOGRAM W/LOWER EXTREMITY (N/A, 04/13/2017); PERIPHERAL VASCULAR INTERVENTION (04/13/2017); Thrombectomy w/ embolectomy (Left, 09/14/2017); Thrombectomy and revision of arterioventous (av) goretex  graft (Left, 10/08/2017); Insertion of dialysis catheter (N/A, 10/08/2017); and UPPER EXTREMITY VENOGRAPHY (N/A, 11/16/2017).   Current Medications: Current Meds  Medication Sig  . acetaminophen (TYLENOL) 500 MG tablet Take 1,000 mg by mouth every 6 (six) hours as needed for moderate pain or headache.  . albuterol (PROVENTIL HFA;VENTOLIN HFA) 108 (90 Base) MCG/ACT inhaler Inhale 1-2 puffs into the lungs every 6 (six) hours as needed for wheezing or shortness of breath.  Marland Kitchen aspirin EC 81 MG tablet Take 81 mg by mouth daily.  . calcium acetate (PHOSLO) 667 MG capsule Take 1,334 mg by mouth 3 (three) times daily with meals.   . calcium-vitamin D (OSCAL WITH D) 500-200 MG-UNIT tablet Take 1 tablet by mouth 2 (two) times a week.  . clopidogrel (PLAVIX) 75 MG tablet Take 1 tablet (75 mg total) by mouth daily.  . repaglinide (PRANDIN) 0.5 MG tablet Take 1 tablet (0.5 mg total) daily with supper by mouth. (Patient taking differently: Take 0.5 mg by mouth 2 (two) times a week.  )  . traMADol (ULTRAM) 50 MG tablet Take 1 tablet (50 mg total) by mouth every 6 (six) hours as needed for moderate pain.  . [DISCONTINUED] amLODipine (NORVASC) 2.5 MG tablet Take 1 tablet (2.5 mg total) by mouth daily.     Allergies:   Lipitor [atorvastatin]; Metoprolol; and Iodinated diagnostic agents   Social History   Tobacco Use  . Smoking status: Former Smoker    Types: Cigarettes    Last attempt to quit: 07/04/2014    Years since quitting: 3.4  . Smokeless tobacco: Never Used  . Tobacco comment: "smoked ~ 1 pack/month when I did smoke; never a steady smoker"  Substance Use Topics  . Alcohol use: Yes    Alcohol/week: 0.0 oz    Comment: 09/24/2016 "2 drinks, 1-2 times/year"  . Drug use: No     Family Hx: The patient's family history includes Hyperlipidemia in his mother; Hypertension in his mother.  ROS:   Please see the history of present illness.    Review of Systems  Cardiovascular: Positive for chest pain.   All other systems reviewed and are negative.   EKGs/Labs/Other Test Reviewed:    EKG:  EKG is  ordered today.  The ekg ordered today demonstrates normal sinus rhythm, heart rate 80, normal  axis, T wave inversions 1, aVL, V3-V6, QTC 463 ms, no change since tracing dated 08/07/2017  Recent Labs: 07/10/2017: Platelets 196 11/16/2017: BUN 47; Creatinine, Ser 12.80; Hemoglobin 11.6; Potassium 4.0; Sodium 137   Recent Lipid Panel Lab Results  Component Value Date/Time   CHOL 192 05/25/2016 03:42 PM   TRIG 249 (H) 05/25/2016 03:42 PM   HDL 34 (L) 05/25/2016 03:42 PM   CHOLHDL 5.6 05/25/2016 03:42 PM   LDLCALC 108 (H) 05/25/2016 03:42 PM    Physical Exam:    VS:  BP 138/82 (BP Location: Right Arm, Patient Position: Sitting, Cuff Size: Normal)   Pulse 80   Ht 5' 6.5" (1.689 m)   Wt 229 lb 12.8 oz (104.2 kg)   BMI 36.54 kg/m     Wt Readings from Last 3 Encounters:  11/27/17 229 lb 12.8 oz (104.2 kg)  11/16/17 228 lb (103.4 kg)  11/11/17 226 lb (102.5 kg)      Physical Exam  Constitutional: He is oriented to person, place, and time. He appears well-developed and well-nourished. No distress.  HENT:  Head: Normocephalic and atraumatic.  Neck: Neck supple. No JVD present.  Cardiovascular: Normal rate, regular rhythm, S1 normal and S2 normal.  No murmur heard. Pulmonary/Chest: Breath sounds normal. He has no rales.  Abdominal: Soft. There is no hepatomegaly.  Musculoskeletal: He exhibits no edema.  Neurological: He is alert and oriented to person, place, and time.  Skin: Skin is warm and dry.    ASSESSMENT & PLAN:    Coronary artery disease involving native coronary artery of native heart with angina pectoris (HCC) History of remote CABG.  He had a nuclear stress test in February 2019 that demonstrated scar but no ischemia.  However, he continues to have exertional chest discomfort consistent with angina.  He describes CCS class III symptoms.  As noted, I have discussed his case previously with Dr. Ross.  I have recommended proceeding with cardiac catheterization for further evaluation and treatment.  Risks and benefits of cardiac catheterization have been discussed with the patient.  These include bleeding, infection, kidney damage, stroke, heart attack, death.  The patient understands these risks and is willing to proceed.   -Continue current dose of isosorbide, clopidogrel, aspirin  -Increase amlodipine to 5 mg daily  -Arrange cardiac catheterization around his dialysis  Chronic diastolic CHF (congestive heart failure) (HCC) Primarily diastolic heart failure.  His ejection fraction was 30% in 2007.  Most recent echo in June 2017 demonstrated EF 45-50% and EF was 53% of most recent nuclear stress test.  Volume management per dialysis.  End-stage renal failure with renal transplant (HCC) He goes to dialysis every Tuesday, Thursday, Saturday.  Essential hypertension Blood pressure close to target.  Adjust amlodipine as noted.  Allergy to  IVP dye, subsequent encounter He has a history of IV dye allergy.  He will be managed with a 12-hour premedication regimen.   Dispo:  Return in about 3 weeks (around 12/18/2017) for Post Procedure Follow Up, w/ Dr. Ross, or Quadir Muns, PA-C.   Medication Adjustments/Labs and Tests Ordered: Current medicines are reviewed at length with the patient today.  Concerns regarding medicines are outlined above.  Tests Ordered: Orders Placed This Encounter  Procedures  . EKG 12-Lead   Medication Changes: Meds ordered this encounter  Medications  . amLODipine (NORVASC) 5 MG tablet    Sig: Take 1 tablet (5 mg total) by mouth daily.    Dispense:  90 tablet    Refill:    3    DOSE INCREASE  . predniSONE (DELTASONE) 50 MG tablet    Sig: Take 12 hours prior to cath, 6 hours prior and 1 hour prior    Dispense:  3 tablet    Refill:  0  . diphenhydrAMINE (BENADRYL) 50 MG tablet    Sig: Take 1 tablet (50 mg total) by mouth once for 1 dose.    Dispense:  1 tablet    Refill:  0    This is for cath, pt has IVP dye allergy    Signed, Richardson Dopp, PA-C  11/27/2017 1:19 PM    [facility] [place], [place], [place]  [address] Phone: [phone]; Fax: [phone]

## 2017-11-28 DIAGNOSIS — Z992 Dependence on renal dialysis: Secondary | ICD-10-CM | POA: Diagnosis not present

## 2017-11-28 DIAGNOSIS — E1029 Type 1 diabetes mellitus with other diabetic kidney complication: Secondary | ICD-10-CM | POA: Diagnosis not present

## 2017-11-28 DIAGNOSIS — N186 End stage renal disease: Secondary | ICD-10-CM | POA: Diagnosis not present

## 2017-11-28 DIAGNOSIS — N2581 Secondary hyperparathyroidism of renal origin: Secondary | ICD-10-CM | POA: Diagnosis not present

## 2017-11-28 DIAGNOSIS — D689 Coagulation defect, unspecified: Secondary | ICD-10-CM | POA: Diagnosis not present

## 2017-11-28 DIAGNOSIS — D509 Iron deficiency anemia, unspecified: Secondary | ICD-10-CM | POA: Diagnosis not present

## 2017-11-30 ENCOUNTER — Other Ambulatory Visit: Payer: Medicare Other | Admitting: *Deleted

## 2017-11-30 DIAGNOSIS — I5032 Chronic diastolic (congestive) heart failure: Secondary | ICD-10-CM | POA: Diagnosis not present

## 2017-11-30 LAB — CBC
Hematocrit: 35.3 % — ABNORMAL LOW (ref 37.5–51.0)
Hemoglobin: 11.9 g/dL — ABNORMAL LOW (ref 13.0–17.7)
MCH: 30.9 pg (ref 26.6–33.0)
MCHC: 33.7 g/dL (ref 31.5–35.7)
MCV: 92 fL (ref 79–97)
PLATELETS: 197 10*3/uL (ref 150–379)
RBC: 3.85 x10E6/uL — AB (ref 4.14–5.80)
RDW: 16.8 % — ABNORMAL HIGH (ref 12.3–15.4)
WBC: 6.2 10*3/uL (ref 3.4–10.8)

## 2017-11-30 LAB — BASIC METABOLIC PANEL
BUN/Creatinine Ratio: 3 — ABNORMAL LOW (ref 10–24)
BUN: 48 mg/dL — ABNORMAL HIGH (ref 8–27)
CHLORIDE: 97 mmol/L (ref 96–106)
CO2: 18 mmol/L — AB (ref 20–29)
Calcium: 8.3 mg/dL — ABNORMAL LOW (ref 8.6–10.2)
Creatinine, Ser: 14.06 mg/dL — ABNORMAL HIGH (ref 0.76–1.27)
GFR calc Af Amer: 4 mL/min/{1.73_m2} — ABNORMAL LOW (ref >59)
GFR, EST NON AFRICAN AMERICAN: 3 mL/min/{1.73_m2} — AB (ref >59)
GLUCOSE: 114 mg/dL — AB (ref 65–99)
POTASSIUM: 4.4 mmol/L (ref 3.5–5.2)
SODIUM: 139 mmol/L (ref 134–144)

## 2017-11-30 NOTE — Progress Notes (Signed)
Pt had been given an Rx to have lab work done at [facility] 12/01/17. Lab tech came to me today with the Rx said pt was here today and that [facility] would not draw lab work. I have placed orders today so that our lab may draw lab work.

## 2017-11-30 NOTE — Addendum Note (Signed)
Addended by: Michae Kava on: 11/30/2017 10:07 AM   Modules accepted: Orders

## 2017-12-01 DIAGNOSIS — E1029 Type 1 diabetes mellitus with other diabetic kidney complication: Secondary | ICD-10-CM | POA: Diagnosis not present

## 2017-12-01 DIAGNOSIS — Z992 Dependence on renal dialysis: Secondary | ICD-10-CM | POA: Diagnosis not present

## 2017-12-01 DIAGNOSIS — D509 Iron deficiency anemia, unspecified: Secondary | ICD-10-CM | POA: Diagnosis not present

## 2017-12-01 DIAGNOSIS — N2581 Secondary hyperparathyroidism of renal origin: Secondary | ICD-10-CM | POA: Diagnosis not present

## 2017-12-01 DIAGNOSIS — N186 End stage renal disease: Secondary | ICD-10-CM | POA: Diagnosis not present

## 2017-12-01 DIAGNOSIS — D689 Coagulation defect, unspecified: Secondary | ICD-10-CM | POA: Diagnosis not present

## 2017-12-03 ENCOUNTER — Telehealth: Payer: Self-pay | Admitting: *Deleted

## 2017-12-03 DIAGNOSIS — D689 Coagulation defect, unspecified: Secondary | ICD-10-CM | POA: Diagnosis not present

## 2017-12-03 DIAGNOSIS — N2581 Secondary hyperparathyroidism of renal origin: Secondary | ICD-10-CM | POA: Diagnosis not present

## 2017-12-03 DIAGNOSIS — N186 End stage renal disease: Secondary | ICD-10-CM | POA: Diagnosis not present

## 2017-12-03 DIAGNOSIS — E1029 Type 1 diabetes mellitus with other diabetic kidney complication: Secondary | ICD-10-CM | POA: Diagnosis not present

## 2017-12-03 DIAGNOSIS — Z992 Dependence on renal dialysis: Secondary | ICD-10-CM | POA: Diagnosis not present

## 2017-12-03 DIAGNOSIS — D509 Iron deficiency anemia, unspecified: Secondary | ICD-10-CM | POA: Diagnosis not present

## 2017-12-03 NOTE — Telephone Encounter (Signed)
Pt contacted pre-catheterization scheduled at [facility] for: Friday May 10,2019 12 noon Verified arrival time and place: [place] Entrance A at: 10 AM  No solid food after midnight prior to cath, clear liquids until 5 AM day of procedure. Verified allergies in Epic  AM meds can be  taken pre-cath with sip of water including: ASA 81 mg Clopidogrel 75 mg Prednisone 50 mg Benadryl 50 mg  Confirmed contrast allergy-pt instructed on 13 hour Prednisone and Benadryl Prep Pre-Procedure: 13 hours prior to procedure time (11PM 12/03/17) take prednisone 50 mg, 7 hours prior to procedure time (5 AM 12/04/17) to prednisone 50 mg, just prior to leaving for hospital 12/04/17  take prednisone 50 mg and benadryl 50 mg.  Confirmed patient has responsible person to drive home post procedure and observe patient for 24 hours: yes

## 2017-12-04 ENCOUNTER — Ambulatory Visit (HOSPITAL_COMMUNITY)
Admission: RE | Disposition: A | Discharge status: Home / Self Care | Payer: Self-pay | Source: Ambulatory Visit | Attending: Interventional Cardiology

## 2017-12-04 ENCOUNTER — Ambulatory Visit (HOSPITAL_COMMUNITY)
Admission: RE | Admit: 2017-12-04 | Discharge: 2017-12-04 | Disposition: A | Discharge status: Home / Self Care | Payer: Medicare Other | Source: Ambulatory Visit | Attending: Interventional Cardiology | Admitting: Interventional Cardiology

## 2017-12-04 DIAGNOSIS — Z7902 Long term (current) use of antithrombotics/antiplatelets: Secondary | ICD-10-CM | POA: Diagnosis not present

## 2017-12-04 DIAGNOSIS — I132 Hypertensive heart and chronic kidney disease with heart failure and with stage 5 chronic kidney disease, or end stage renal disease: Secondary | ICD-10-CM | POA: Diagnosis not present

## 2017-12-04 DIAGNOSIS — I5032 Chronic diastolic (congestive) heart failure: Secondary | ICD-10-CM | POA: Insufficient documentation

## 2017-12-04 DIAGNOSIS — I25708 Atherosclerosis of coronary artery bypass graft(s), unspecified, with other forms of angina pectoris: Secondary | ICD-10-CM | POA: Diagnosis not present

## 2017-12-04 DIAGNOSIS — Z992 Dependence on renal dialysis: Secondary | ICD-10-CM | POA: Diagnosis not present

## 2017-12-04 DIAGNOSIS — Z7982 Long term (current) use of aspirin: Secondary | ICD-10-CM | POA: Diagnosis not present

## 2017-12-04 DIAGNOSIS — I2582 Chronic total occlusion of coronary artery: Secondary | ICD-10-CM | POA: Diagnosis not present

## 2017-12-04 DIAGNOSIS — I252 Old myocardial infarction: Secondary | ICD-10-CM | POA: Diagnosis not present

## 2017-12-04 DIAGNOSIS — N2581 Secondary hyperparathyroidism of renal origin: Secondary | ICD-10-CM | POA: Diagnosis not present

## 2017-12-04 DIAGNOSIS — E1151 Type 2 diabetes mellitus with diabetic peripheral angiopathy without gangrene: Secondary | ICD-10-CM | POA: Diagnosis not present

## 2017-12-04 DIAGNOSIS — N186 End stage renal disease: Secondary | ICD-10-CM | POA: Insufficient documentation

## 2017-12-04 DIAGNOSIS — Z905 Acquired absence of kidney: Secondary | ICD-10-CM | POA: Insufficient documentation

## 2017-12-04 DIAGNOSIS — M109 Gout, unspecified: Secondary | ICD-10-CM | POA: Insufficient documentation

## 2017-12-04 DIAGNOSIS — E1122 Type 2 diabetes mellitus with diabetic chronic kidney disease: Secondary | ICD-10-CM | POA: Insufficient documentation

## 2017-12-04 DIAGNOSIS — Z87891 Personal history of nicotine dependence: Secondary | ICD-10-CM | POA: Insufficient documentation

## 2017-12-04 DIAGNOSIS — I5083 High output heart failure: Secondary | ICD-10-CM | POA: Diagnosis not present

## 2017-12-04 DIAGNOSIS — I25119 Atherosclerotic heart disease of native coronary artery with unspecified angina pectoris: Secondary | ICD-10-CM | POA: Diagnosis not present

## 2017-12-04 DIAGNOSIS — T50995D Adverse effect of other drugs, medicaments and biological substances, subsequent encounter: Secondary | ICD-10-CM

## 2017-12-04 DIAGNOSIS — Z940 Kidney transplant status: Secondary | ICD-10-CM | POA: Diagnosis not present

## 2017-12-04 HISTORY — PX: LEFT HEART CATH AND CORS/GRAFTS ANGIOGRAPHY: CATH118250

## 2017-12-04 LAB — GLUCOSE, CAPILLARY
Glucose-Capillary: 230 mg/dL — ABNORMAL HIGH (ref 65–99)
Glucose-Capillary: 175 mg/dL — ABNORMAL HIGH (ref 65–99)

## 2017-12-04 SURGERY — LEFT HEART CATH AND CORS/GRAFTS ANGIOGRAPHY
Anesthesia: LOCAL

## 2017-12-04 MED ORDER — SODIUM CHLORIDE 0.9 % IV SOLN: 250.0000 mL | INTRAVENOUS | Status: DC | PRN

## 2017-12-04 MED ORDER — LIDOCAINE HCL (PF) 1 % IJ SOLN
INTRAMUSCULAR | Status: DC | PRN
  Administered 2017-12-04: 15 mL

## 2017-12-04 MED ORDER — MIDAZOLAM HCL 2 MG/2ML IJ SOLN
INTRAMUSCULAR | Status: DC | PRN
  Administered 2017-12-04 (×2): 1 mg via INTRAVENOUS

## 2017-12-04 MED ORDER — HEPARIN (PORCINE) IN NACL 2-0.9 UNITS/ML
INTRAMUSCULAR | Status: AC | PRN
  Administered 2017-12-04 (×2): 500 mL

## 2017-12-04 MED ORDER — LIDOCAINE HCL (PF) 1 % IJ SOLN
INTRAMUSCULAR | Status: AC
  Filled 2017-12-04: qty 30

## 2017-12-04 MED ORDER — OXYCODONE HCL 5 MG PO TABS: 5.0000 mg | ORAL_TABLET | ORAL | Status: DC | PRN

## 2017-12-04 MED ORDER — IOHEXOL 350 MG/ML SOLN
INTRAVENOUS | Status: DC | PRN
  Administered 2017-12-04: 125 mL via INTRA_ARTERIAL

## 2017-12-04 MED ORDER — ONDANSETRON HCL 4 MG/2ML IJ SOLN
4.0000 mg | Freq: Four times a day (QID) | INTRAMUSCULAR | Status: DC | PRN
Start: 2017-12-04 — End: 2017-12-04

## 2017-12-04 MED ORDER — ACETAMINOPHEN 325 MG PO TABS: 650.0000 mg | ORAL_TABLET | ORAL | Status: DC | PRN

## 2017-12-04 MED ORDER — ASPIRIN 81 MG PO CHEW: 81.0000 mg | CHEWABLE_TABLET | ORAL | Status: DC

## 2017-12-04 MED ORDER — SODIUM CHLORIDE 0.9% FLUSH: 3.0000 mL | Freq: Two times a day (BID) | INTRAVENOUS | Status: DC

## 2017-12-04 MED ORDER — SODIUM CHLORIDE 0.9% FLUSH: 3.0000 mL | INTRAVENOUS | Status: DC | PRN

## 2017-12-04 MED ORDER — FENTANYL CITRATE (PF) 100 MCG/2ML IJ SOLN
INTRAMUSCULAR | Status: DC | PRN
  Administered 2017-12-04: 50 ug via INTRAVENOUS

## 2017-12-04 MED ORDER — MIDAZOLAM HCL 2 MG/2ML IJ SOLN
INTRAMUSCULAR | Status: AC
  Filled 2017-12-04: qty 2

## 2017-12-04 MED ORDER — HEPARIN (PORCINE) IN NACL 1000-0.9 UT/500ML-% IV SOLN
INTRAVENOUS | Status: AC
  Filled 2017-12-04: qty 1000

## 2017-12-04 MED ORDER — SODIUM CHLORIDE 0.9 % IV SOLN
INTRAVENOUS | Status: DC
  Administered 2017-12-04: 11:00:00 via INTRAVENOUS

## 2017-12-04 MED ORDER — FENTANYL CITRATE (PF) 100 MCG/2ML IJ SOLN
INTRAMUSCULAR | Status: AC
  Filled 2017-12-04: qty 2

## 2017-12-04 SURGICAL SUPPLY — 11 items
CATH INFINITI 5 FR MPA2 (CATHETERS) ×2 IMPLANT
CATH INFINITI 5FR AL1 (CATHETERS) ×2 IMPLANT
CATH INFINITI JR4 5F (CATHETERS) ×2 IMPLANT
COVER PRB 48X5XTLSCP FOLD TPE (BAG) ×1 IMPLANT
COVER PROBE 5X48 (BAG) ×1
KIT HEART LEFT (KITS) ×2 IMPLANT
PACK CARDIAC CATHETERIZATION (CUSTOM PROCEDURE TRAY) ×2 IMPLANT
SHEATH AVANTI 11CM 5FR (SHEATH) ×2 IMPLANT
TRANSDUCER W/STOPCOCK (MISCELLANEOUS) ×2 IMPLANT
TUBING CIL FLEX 10 FLL-RA (TUBING) ×2 IMPLANT
WIRE EMERALD 3MM-J .035X150CM (WIRE) ×2 IMPLANT

## 2017-12-04 NOTE — Interval H&P Note (Signed)
Cath Lab Visit (complete for each Cath Lab visit)  Clinical Evaluation Leading to the Procedure:   ACS: No.  Non-ACS:    Anginal Classification: CCS III  Anti-ischemic medical therapy: Maximal Therapy (2 or more classes of medications)  Non-Invasive Test Results: No non-invasive testing performed  Prior CABG: Previous CABG      History and Physical Interval Note:  12/04/2017 12:54 PM  Patrick [name]  has presented today for surgery, with the diagnosis of cad with angina  The various methods of treatment have been discussed with the patient and family. After consideration of risks, benefits and other options for treatment, the patient has consented to  Procedure(s): LEFT HEART CATH AND CORS/GRAFTS ANGIOGRAPHY (N/A) as a surgical intervention .  The patient's history has been reviewed, patient examined, no change in status, stable for surgery.  I have reviewed the patient's chart and labs.  Questions were answered to the patient's satisfaction.     Belva Crome III

## 2017-12-04 NOTE — Progress Notes (Signed)
Site area: right groin a 5 french arterial sheath was removed  Site Prior to Removal:  Level 0  Pressure Applied For 20 MINUTES    Bedrest Beginning at 1415pm Manual:   Yes.    Patient Status During Pull:  stable  Post Pull Groin Site:  Level 0  Post Pull Instructions Given:  Yes.    Post Pull Pulses Present:  Yes.    Dressing Applied:  Yes.    Comments:  VS remain stable

## 2017-12-04 NOTE — Discharge Instructions (Signed)

## 2017-12-05 DIAGNOSIS — Z992 Dependence on renal dialysis: Secondary | ICD-10-CM | POA: Diagnosis not present

## 2017-12-05 DIAGNOSIS — E1029 Type 1 diabetes mellitus with other diabetic kidney complication: Secondary | ICD-10-CM | POA: Diagnosis not present

## 2017-12-05 DIAGNOSIS — N2581 Secondary hyperparathyroidism of renal origin: Secondary | ICD-10-CM | POA: Diagnosis not present

## 2017-12-05 DIAGNOSIS — N186 End stage renal disease: Secondary | ICD-10-CM | POA: Diagnosis not present

## 2017-12-05 DIAGNOSIS — D509 Iron deficiency anemia, unspecified: Secondary | ICD-10-CM | POA: Diagnosis not present

## 2017-12-05 DIAGNOSIS — D689 Coagulation defect, unspecified: Secondary | ICD-10-CM | POA: Diagnosis not present

## 2017-12-07 ENCOUNTER — Encounter (HOSPITAL_COMMUNITY): Payer: Self-pay | Admitting: Interventional Cardiology

## 2017-12-08 DIAGNOSIS — Z992 Dependence on renal dialysis: Secondary | ICD-10-CM | POA: Diagnosis not present

## 2017-12-08 DIAGNOSIS — D509 Iron deficiency anemia, unspecified: Secondary | ICD-10-CM | POA: Diagnosis not present

## 2017-12-08 DIAGNOSIS — N186 End stage renal disease: Secondary | ICD-10-CM | POA: Diagnosis not present

## 2017-12-08 DIAGNOSIS — D689 Coagulation defect, unspecified: Secondary | ICD-10-CM | POA: Diagnosis not present

## 2017-12-08 DIAGNOSIS — E1029 Type 1 diabetes mellitus with other diabetic kidney complication: Secondary | ICD-10-CM | POA: Diagnosis not present

## 2017-12-08 DIAGNOSIS — N2581 Secondary hyperparathyroidism of renal origin: Secondary | ICD-10-CM | POA: Diagnosis not present

## 2017-12-08 MED FILL — Heparin Sod (Porcine)-NaCl IV Soln 1000 Unit/500ML-0.9%: INTRAVENOUS | Qty: 1000 | Status: AC

## 2017-12-10 DIAGNOSIS — N2581 Secondary hyperparathyroidism of renal origin: Secondary | ICD-10-CM | POA: Diagnosis not present

## 2017-12-10 DIAGNOSIS — D509 Iron deficiency anemia, unspecified: Secondary | ICD-10-CM | POA: Diagnosis not present

## 2017-12-10 DIAGNOSIS — Z992 Dependence on renal dialysis: Secondary | ICD-10-CM | POA: Diagnosis not present

## 2017-12-10 DIAGNOSIS — E1029 Type 1 diabetes mellitus with other diabetic kidney complication: Secondary | ICD-10-CM | POA: Diagnosis not present

## 2017-12-10 DIAGNOSIS — D689 Coagulation defect, unspecified: Secondary | ICD-10-CM | POA: Diagnosis not present

## 2017-12-10 DIAGNOSIS — N186 End stage renal disease: Secondary | ICD-10-CM | POA: Diagnosis not present

## 2017-12-12 DIAGNOSIS — D509 Iron deficiency anemia, unspecified: Secondary | ICD-10-CM | POA: Diagnosis not present

## 2017-12-12 DIAGNOSIS — D689 Coagulation defect, unspecified: Secondary | ICD-10-CM | POA: Diagnosis not present

## 2017-12-12 DIAGNOSIS — Z992 Dependence on renal dialysis: Secondary | ICD-10-CM | POA: Diagnosis not present

## 2017-12-12 DIAGNOSIS — N2581 Secondary hyperparathyroidism of renal origin: Secondary | ICD-10-CM | POA: Diagnosis not present

## 2017-12-12 DIAGNOSIS — E1029 Type 1 diabetes mellitus with other diabetic kidney complication: Secondary | ICD-10-CM | POA: Diagnosis not present

## 2017-12-12 DIAGNOSIS — N186 End stage renal disease: Secondary | ICD-10-CM | POA: Diagnosis not present

## 2017-12-15 ENCOUNTER — Ambulatory Visit
Admission: RE | Admit: 2017-12-15 | Discharge: 2017-12-15 | Disposition: A | Discharge status: Home / Self Care | Payer: Medicare Other | Source: Ambulatory Visit | Attending: Nephrology | Admitting: Nephrology

## 2017-12-15 ENCOUNTER — Other Ambulatory Visit: Payer: Self-pay | Admitting: Nephrology

## 2017-12-15 DIAGNOSIS — D689 Coagulation defect, unspecified: Secondary | ICD-10-CM | POA: Diagnosis not present

## 2017-12-15 DIAGNOSIS — R05 Cough: Secondary | ICD-10-CM

## 2017-12-15 DIAGNOSIS — D509 Iron deficiency anemia, unspecified: Secondary | ICD-10-CM | POA: Diagnosis not present

## 2017-12-15 DIAGNOSIS — N186 End stage renal disease: Secondary | ICD-10-CM | POA: Diagnosis not present

## 2017-12-15 DIAGNOSIS — R059 Cough, unspecified: Secondary | ICD-10-CM

## 2017-12-15 DIAGNOSIS — Z992 Dependence on renal dialysis: Secondary | ICD-10-CM | POA: Diagnosis not present

## 2017-12-15 DIAGNOSIS — N2581 Secondary hyperparathyroidism of renal origin: Secondary | ICD-10-CM | POA: Diagnosis not present

## 2017-12-15 DIAGNOSIS — E1029 Type 1 diabetes mellitus with other diabetic kidney complication: Secondary | ICD-10-CM | POA: Diagnosis not present

## 2017-12-15 DIAGNOSIS — R0602 Shortness of breath: Secondary | ICD-10-CM | POA: Diagnosis not present

## 2017-12-15 DIAGNOSIS — J9811 Atelectasis: Secondary | ICD-10-CM | POA: Diagnosis not present

## 2017-12-17 DIAGNOSIS — I4891 Unspecified atrial fibrillation: Secondary | ICD-10-CM | POA: Diagnosis not present

## 2017-12-17 DIAGNOSIS — D689 Coagulation defect, unspecified: Secondary | ICD-10-CM | POA: Diagnosis not present

## 2017-12-17 DIAGNOSIS — N2581 Secondary hyperparathyroidism of renal origin: Secondary | ICD-10-CM | POA: Diagnosis not present

## 2017-12-17 DIAGNOSIS — E1029 Type 1 diabetes mellitus with other diabetic kidney complication: Secondary | ICD-10-CM | POA: Diagnosis not present

## 2017-12-17 DIAGNOSIS — Z992 Dependence on renal dialysis: Secondary | ICD-10-CM | POA: Diagnosis not present

## 2017-12-17 DIAGNOSIS — N186 End stage renal disease: Secondary | ICD-10-CM | POA: Diagnosis not present

## 2017-12-17 DIAGNOSIS — D509 Iron deficiency anemia, unspecified: Secondary | ICD-10-CM | POA: Diagnosis not present

## 2017-12-18 ENCOUNTER — Ambulatory Visit (INDEPENDENT_AMBULATORY_CARE_PROVIDER_SITE_OTHER): Payer: Medicare Other | Admitting: Physician Assistant

## 2017-12-18 ENCOUNTER — Encounter: Payer: Self-pay | Admitting: Physician Assistant

## 2017-12-18 VITALS — BP 122/82 | HR 97 | Ht 66.0 in | Wt 230.0 lb

## 2017-12-18 DIAGNOSIS — I25119 Atherosclerotic heart disease of native coronary artery with unspecified angina pectoris: Secondary | ICD-10-CM | POA: Diagnosis not present

## 2017-12-18 DIAGNOSIS — Z940 Kidney transplant status: Secondary | ICD-10-CM | POA: Diagnosis not present

## 2017-12-18 DIAGNOSIS — E785 Hyperlipidemia, unspecified: Secondary | ICD-10-CM

## 2017-12-18 DIAGNOSIS — N186 End stage renal disease: Secondary | ICD-10-CM

## 2017-12-18 DIAGNOSIS — I5032 Chronic diastolic (congestive) heart failure: Secondary | ICD-10-CM | POA: Diagnosis not present

## 2017-12-18 DIAGNOSIS — T8619 Other complication of kidney transplant: Secondary | ICD-10-CM

## 2017-12-18 DIAGNOSIS — I10 Essential (primary) hypertension: Secondary | ICD-10-CM | POA: Diagnosis not present

## 2017-12-18 MED ORDER — ALBUTEROL SULFATE HFA 108 (90 BASE) MCG/ACT IN AERS: 1.0000 | INHALATION_SPRAY | Freq: Four times a day (QID) | RESPIRATORY_TRACT | 0 refills | Status: DC | PRN

## 2017-12-18 MED ORDER — CLOPIDOGREL BISULFATE 75 MG PO TABS: 75.0000 mg | ORAL_TABLET | Freq: Every day | ORAL | 3 refills | Status: DC

## 2017-12-18 MED ORDER — ROSUVASTATIN CALCIUM 10 MG PO TABS: 10.0000 mg | ORAL_TABLET | Freq: Every day | ORAL | 3 refills | Status: DC

## 2017-12-18 NOTE — Progress Notes (Signed)
Cardiology Office Note:    Date:  12/18/2017   ID:  ELGIN CARN, DOB [birth year], MRN [id number]  PCP:  Nolene Ebbs, MD  Cardiologist:  Dorris Carnes, MD  Electrophysiologist: Allegra Lai, MD  Referring MD: Nolene Ebbs, MD   Chief Complaint  Patient presents with  . Hospitalization Follow-up    s/p cath    History of Present Illness:    Patrick [name] is a 62 y.o. 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 In 2007, he developed reduced LV function with EF 30% and high output heart failure attributed to simultaneous left and right AV fistulas for dialysis. His right AV fistula was closed and he had resolution of symptoms and recovery of LV function. Cardiac catheterization in February 2007 demonstrated normal left main, LAD, LCx and mid RCA 25%. LIMA-LAD was atretic. He had anabnormal Myoview in 2015 at [facility] with apical, apical anterior, lateral, apical, inferoapical ischemia. Cardiac catheterization was deferred at that time secondary to worsening renal function. His chart indicates he had a heart catheterization sometime in 2016 (records not available). Stress testing with Dr. Einar Gip in 2016 demonstrated a very small area of inferior ischemia. The study was low risk and he was treated medically.   Nuclear stress test in February 2019 demonstrated anteroseptal, apical anterior, apical septal, apical lateral, apical scar but no ischemia.  Medical therapy was recommended.  However, he continued to have anginal symptoms despite multiple antianginal agents.  Therefore Cardiac Catheterization was arranged.   Cardiac catheterization performed 12/04/2017 demonstrated an atretic LIMA-LAD and an occluded LAD with left to left and left to right collaterals.  He had  moderate nonobstructive disease elsewhere.  It was felt that his angina was related to his occluded LAD.  CTO intervention would be very difficult and therefore he is not an ideal candidate.  Continued medical therapy was recommended.  Mr. Throne returns for follow-up.  He is here alone.  Since increasing amlodipine, he has not noticed much chest discomfort.  He has been able to exert himself without symptoms.  Notably, he was able to have sexual intercourse without angina.  Prior to increasing his medication, this activity would cause significant chest discomfort.  He has some dyspnea with activity but no significant change.  He denies PND or significant edema.  He denies syncope.  Prior CV studies:   The following studies were reviewed today:  Cardiac Catheterization 12/04/17 LAD proximal 100 (left to left and right-to-left collaterals); D1 ostial 50 OM1 50 RCA proximal 50, mid 60 LIMA-LAD atretic Medical therapy; approach to LAD makes CTO intervention difficult  Nuclear stress test 09/02/17 EF 53, anteroseptal/apical anterior/apical septal/apical lateral/apical scar, no ischemia  Echo 01/25/16 Mild concentric LVH, EF 45-50, diffuse HK, grade 2 diastolic dysfunction, very mild aortic stenosis (mean 13, peak 26), MAC, mild MR, mild LAE  Nuclear stress test 01/08/15(Dr. Einar Gip) EF 55, very small area of ischemia in the basal inferior, inferoapical and apical septal regions; low risk  Echo 11/29/13 Mild LVH, EF 99-24, grade 1 diastolic dysfunction, MAC, mild LAE  Nuclear stress test 10/10/13 ([facility]) FINAL COMMENTS - Moderate apical and periapical (i.e. apex, apical anterior/ lateral/ inferior segments) ischemia status post regadenoson stress. - LVEF= 63%. Septal dyskinesis in a post- CABG pattern. Otherwise normal systolic function. - Mild RV uptake,  suggestive of RV hypertrophy  Past Medical History:  Diagnosis Date  . Arthritis    "back" (09/24/2016)  . ESRD (end stage renal  disease) on dialysis [facility])    "TTS; [facility]" (09/24/2016)  . Gout   . History of blood transfusion 2009- 2016   "I've had several; low HgB"  . History of kidney stones    treated with nephrectomy  . Hypertension   . Impotence of organic origin   . Migraine    "stopped in my 30's; they were related to high BP" (09/24/2016)  . Myocardial infarction [facility]) '96  . Pneumonia 01/2015   " a touch & I was in the hosp."  . Secondary hyperparathyroidism ([place])   . Type 2 diabetes mellitus (HCC)    no longer on medication since going on dialysis   Surgical Hx: The patient  has a past surgical history that includes Kidney transplant (2009); Appendectomy; AV fistula placement (Right, 09/18/2014); ir generic historical (05/11/2016); ir generic historical (05/11/2016); ir generic historical (05/11/2016); ir generic historical (05/11/2016); Cardiac catheterization (N/A, 06/04/2016); AV fistula placement (Left, 07/07/2016); Hernia repair (2017); Nephrectomy (2017); Cardiac catheterization (~ 2016); A-FLUTTER ABLATION (N/A, 09/24/2016); Coronary artery bypass graft (1997); ABDOMINAL AORTOGRAM W/LOWER EXTREMITY (N/A, 04/13/2017); PERIPHERAL VASCULAR INTERVENTION (04/13/2017); Thrombectomy w/ embolectomy (Left, 09/14/2017); Thrombectomy and revision of arterioventous (av) goretex  graft (Left, 10/08/2017); Insertion of dialysis catheter (N/A, 10/08/2017); UPPER EXTREMITY VENOGRAPHY (N/A, 11/16/2017); and LEFT HEART CATH AND CORS/GRAFTS ANGIOGRAPHY (N/A, 12/04/2017).   Current Medications: Current Meds  Medication Sig  . acetaminophen (TYLENOL) 500 MG tablet Take 1,000 mg by mouth every 6 (six) hours as needed for moderate pain or headache.  Marland Kitchen amLODipine (NORVASC) 5 MG tablet Take 1 tablet (5 mg total) by mouth daily.  Marland Kitchen aspirin EC 81 MG tablet Take 81 mg by mouth daily.  . calcium acetate (PHOSLO) 667 MG capsule Take 1,334 mg by mouth daily with supper.   Marland Kitchen CALCIUM CARBONATE-VITAMIN D PO Take 1 tablet by mouth every  Wednesday.   . clopidogrel (PLAVIX) 75 MG tablet Take 1 tablet (75 mg total) by mouth daily.  . diphenhydrAMINE (BENADRYL) 50 MG tablet Take 50 mg by mouth as directed.  . isosorbide mononitrate (IMDUR) 60 MG 24 hr tablet Take 60 mg by mouth daily.  . predniSONE (DELTASONE) 50 MG tablet Take 12 hours prior to cath, 6 hours prior and 1 hour prior  . repaglinide (PRANDIN) 0.5 MG tablet Take 0.5 mg by mouth 2 (two) times a week.  . traMADol (ULTRAM) 50 MG tablet Take 1 tablet (50 mg total) by mouth every 6 (six) hours as needed for moderate pain.  . [DISCONTINUED] clopidogrel (PLAVIX) 75 MG tablet Take 1 tablet (75 mg total) by mouth daily.     Allergies:   Lipitor [atorvastatin]; Metoprolol; and Iodinated diagnostic agents   Social History   Tobacco Use  . Smoking status: Former Smoker    Types: Cigarettes    Last attempt to quit: 07/04/2014    Years since quitting: 3.4  . Smokeless tobacco: Never Used  . Tobacco comment: "smoked ~ 1 pack/month when I did smoke; never a steady smoker"  Substance Use Topics  . Alcohol use: Yes    Alcohol/week: 0.0 oz    Comment: 09/24/2016 "2 drinks, 1-2 times/year"  . Drug use: No     Family Hx: The patient's family history includes Hyperlipidemia in his mother; Hypertension in his mother.  ROS:   Please see the history of present illness.  ROS All other systems reviewed and are negative.   EKGs/Labs/Other Test Reviewed:    EKG:  EKG is not ordered today.    Recent Labs: 11/30/2017: BUN 48; Creatinine, Ser 14.06; Hemoglobin 11.9; Platelets 197; Potassium 4.4; Sodium 139   Recent Lipid Panel Lab Results  Component Value Date/Time   CHOL 192 05/25/2016 03:42 PM   TRIG 249 (H) 05/25/2016 03:42 PM   HDL 34 (L) 05/25/2016 03:42 PM   CHOLHDL 5.6 05/25/2016 03:42 PM   LDLCALC 108 (H) 05/25/2016 03:42 PM    Physical Exam:    VS:  BP 122/82   Pulse 97   Ht 5' 6"  (1.676 m)   Wt 230 lb (104.3 kg)   SpO2 97%   BMI 37.12 kg/m     Wt  Readings from Last 3 Encounters:  12/18/17 230 lb (104.3 kg)  12/04/17 228 lb (103.4 kg)  11/27/17 229 lb 12.8 oz (104.2 kg)     Physical Exam  Constitutional: He is oriented to person, place, and time. He appears well-developed and well-nourished. No distress.  HENT:  Head: Normocephalic and atraumatic.  Neck: Neck supple. No JVD present.  Cardiovascular: Normal rate, regular rhythm, S1 normal and S2 normal.  No murmur heard. Pulmonary/Chest: Breath sounds normal. He has no rales.  Abdominal: Soft. There is no hepatomegaly.  Musculoskeletal: He exhibits no edema.  RFA site without hematoma or bruit  Neurological: He is alert and oriented to person, place, and time.  Skin: Skin is warm and dry.    ASSESSMENT & PLAN:    Coronary artery disease involving native coronary artery of native heart with angina pectoris ([place]) History of remote CABG.  Recent cardiac catheterization was performed due to continued anginal symptoms despite multiple antianginal medications.  As noted, his LIMA-LAD was atretic.  He has a chronically occluded LAD with left to left and left to right collaterals.  CTO PCI would be difficult and is not recommended.  He is feeling much better on higher dose amlodipine.  Continue current regimen which includes aspirin, Plavix, amlodipine, isosorbide.  Chronic diastolic CHF (congestive heart failure) (HCC) Primarily diastolic heart failure.  He is NYHA 2.  EF 45-50 by echo 6/17.  Volume status is managed with dialysis.  End-stage renal failure with renal transplant ([place]) Tuesday, Thursday, Saturday dialysis.  Essential hypertension The patient's blood pressure is controlled on his current regimen.  Continue current therapy.   Hyperlipidemia, unspecified hyperlipidemia type Previous LDL was over 100.  Given his coronary artery disease, statin therapy is indicated.  He had difficulty taking atorvastatin in the past due to myalgias.  I have recommended trying rosuvastatin  10 mg daily.  Obtain lipids and LFTs in 3 months.  If he has myalgias on low-dose Crestor, consider starting ezetimibe.   Dispo:  Return in about 6 months (around 06/20/2018) for Routine Follow Up, w/ Dr. [name], or Richardson Dopp, PA-C.   Medication Adjustments/Labs and Tests Ordered: Current medicines are reviewed at length with the patient today.  Concerns regarding medicines are outlined above.  Tests Ordered: Orders Placed This Encounter  Procedures  . Lipid Profile  . Hepatic function panel   Medication Changes: Meds ordered this encounter  Medications  . clopidogrel (PLAVIX) 75 MG tablet    Sig: Take 1 tablet (75 mg total) by mouth daily.    Dispense:  90 tablet    Refill:  3  . rosuvastatin (CRESTOR) 10 MG tablet    Sig: Take 1 tablet (10 mg total)  by mouth daily.    Dispense:  90 tablet    Refill:  3  . albuterol (PROVENTIL HFA;VENTOLIN HFA) 108 (90 Base) MCG/ACT inhaler    Sig: Inhale 1-2 puffs into the lungs every 6 (six) hours as needed for wheezing or shortness of breath.    Dispense:  1 Inhaler    Refill:  0    THIS IS A 1 TIME REFILL ; FURTHER REFILLS TO BE FILLED BY PCP    Signed, Richardson Dopp, PA-C  12/18/2017 12:50 PM    [facility] [address], [place], [place]  [address] Phone: [phone]; Fax: [phone]

## 2017-12-18 NOTE — Patient Instructions (Addendum)
Medication Instructions:  1. START CRESTOR 10 MG DAILY; RX HAS BEEN SENT IN  2. A REFILL FOR PLAVIX 75 MG DAILY HAS BEEN SENT IN  3. A ONE TIME REFILL FOR YOUR PROVENTIL INHALER HAS BEEN SENT IN    Labwork: 1. IN 3 MONTHS FASTING LIPID AND LIVER PANEL; NOTHING TO EAT AFTER MIDNIGHT THE NIGHT BEFORE LAB WORK; OK TO TAKE MORNING MEDICATIONS WITH WATER ON THE MORNING OF LAB WORK. 04/02/18   Testing/Procedures: NONE ORDERED TODAY  Follow-Up: Your physician wants you to follow-up in: [place] DR. ROSS  You will receive a reminder letter in the mail two months in advance. If you don't receive a letter, please call our office to schedule the follow-up appointment.   Any Other Special Instructions Will Be Listed Below (If Applicable).     If you need a refill on your cardiac medications before your next appointment, please call your pharmacy.

## 2017-12-19 DIAGNOSIS — N2581 Secondary hyperparathyroidism of renal origin: Secondary | ICD-10-CM | POA: Diagnosis not present

## 2017-12-19 DIAGNOSIS — E1029 Type 1 diabetes mellitus with other diabetic kidney complication: Secondary | ICD-10-CM | POA: Diagnosis not present

## 2017-12-19 DIAGNOSIS — D509 Iron deficiency anemia, unspecified: Secondary | ICD-10-CM | POA: Diagnosis not present

## 2017-12-19 DIAGNOSIS — N186 End stage renal disease: Secondary | ICD-10-CM | POA: Diagnosis not present

## 2017-12-19 DIAGNOSIS — Z992 Dependence on renal dialysis: Secondary | ICD-10-CM | POA: Diagnosis not present

## 2017-12-19 DIAGNOSIS — D689 Coagulation defect, unspecified: Secondary | ICD-10-CM | POA: Diagnosis not present

## 2017-12-22 DIAGNOSIS — N2581 Secondary hyperparathyroidism of renal origin: Secondary | ICD-10-CM | POA: Diagnosis not present

## 2017-12-22 DIAGNOSIS — D509 Iron deficiency anemia, unspecified: Secondary | ICD-10-CM | POA: Diagnosis not present

## 2017-12-22 DIAGNOSIS — N186 End stage renal disease: Secondary | ICD-10-CM | POA: Diagnosis not present

## 2017-12-22 DIAGNOSIS — Z992 Dependence on renal dialysis: Secondary | ICD-10-CM | POA: Diagnosis not present

## 2017-12-22 DIAGNOSIS — D689 Coagulation defect, unspecified: Secondary | ICD-10-CM | POA: Diagnosis not present

## 2017-12-22 DIAGNOSIS — E1029 Type 1 diabetes mellitus with other diabetic kidney complication: Secondary | ICD-10-CM | POA: Diagnosis not present

## 2017-12-24 DIAGNOSIS — N186 End stage renal disease: Secondary | ICD-10-CM | POA: Diagnosis not present

## 2017-12-24 DIAGNOSIS — D509 Iron deficiency anemia, unspecified: Secondary | ICD-10-CM | POA: Diagnosis not present

## 2017-12-24 DIAGNOSIS — Z992 Dependence on renal dialysis: Secondary | ICD-10-CM | POA: Diagnosis not present

## 2017-12-24 DIAGNOSIS — N2581 Secondary hyperparathyroidism of renal origin: Secondary | ICD-10-CM | POA: Diagnosis not present

## 2017-12-24 DIAGNOSIS — E1029 Type 1 diabetes mellitus with other diabetic kidney complication: Secondary | ICD-10-CM | POA: Diagnosis not present

## 2017-12-24 DIAGNOSIS — D689 Coagulation defect, unspecified: Secondary | ICD-10-CM | POA: Diagnosis not present

## 2017-12-26 DIAGNOSIS — N2581 Secondary hyperparathyroidism of renal origin: Secondary | ICD-10-CM | POA: Diagnosis not present

## 2017-12-26 DIAGNOSIS — T8610 Unspecified complication of kidney transplant: Secondary | ICD-10-CM | POA: Diagnosis not present

## 2017-12-26 DIAGNOSIS — D509 Iron deficiency anemia, unspecified: Secondary | ICD-10-CM | POA: Diagnosis not present

## 2017-12-26 DIAGNOSIS — D689 Coagulation defect, unspecified: Secondary | ICD-10-CM | POA: Diagnosis not present

## 2017-12-26 DIAGNOSIS — E1029 Type 1 diabetes mellitus with other diabetic kidney complication: Secondary | ICD-10-CM | POA: Diagnosis not present

## 2017-12-26 DIAGNOSIS — N186 End stage renal disease: Secondary | ICD-10-CM | POA: Diagnosis not present

## 2017-12-26 DIAGNOSIS — D631 Anemia in chronic kidney disease: Secondary | ICD-10-CM | POA: Diagnosis not present

## 2017-12-26 DIAGNOSIS — Z992 Dependence on renal dialysis: Secondary | ICD-10-CM | POA: Diagnosis not present

## 2017-12-29 DIAGNOSIS — Z992 Dependence on renal dialysis: Secondary | ICD-10-CM | POA: Diagnosis not present

## 2017-12-29 DIAGNOSIS — E1029 Type 1 diabetes mellitus with other diabetic kidney complication: Secondary | ICD-10-CM | POA: Diagnosis not present

## 2017-12-29 DIAGNOSIS — N186 End stage renal disease: Secondary | ICD-10-CM | POA: Diagnosis not present

## 2017-12-29 DIAGNOSIS — D689 Coagulation defect, unspecified: Secondary | ICD-10-CM | POA: Diagnosis not present

## 2017-12-29 DIAGNOSIS — D509 Iron deficiency anemia, unspecified: Secondary | ICD-10-CM | POA: Diagnosis not present

## 2017-12-29 DIAGNOSIS — N2581 Secondary hyperparathyroidism of renal origin: Secondary | ICD-10-CM | POA: Diagnosis not present

## 2017-12-30 ENCOUNTER — Other Ambulatory Visit: Payer: Self-pay

## 2017-12-31 DIAGNOSIS — Z992 Dependence on renal dialysis: Secondary | ICD-10-CM | POA: Diagnosis not present

## 2017-12-31 DIAGNOSIS — D509 Iron deficiency anemia, unspecified: Secondary | ICD-10-CM | POA: Diagnosis not present

## 2017-12-31 DIAGNOSIS — N2581 Secondary hyperparathyroidism of renal origin: Secondary | ICD-10-CM | POA: Diagnosis not present

## 2017-12-31 DIAGNOSIS — N186 End stage renal disease: Secondary | ICD-10-CM | POA: Diagnosis not present

## 2017-12-31 DIAGNOSIS — E1029 Type 1 diabetes mellitus with other diabetic kidney complication: Secondary | ICD-10-CM | POA: Diagnosis not present

## 2017-12-31 DIAGNOSIS — D689 Coagulation defect, unspecified: Secondary | ICD-10-CM | POA: Diagnosis not present

## 2018-01-02 DIAGNOSIS — N2581 Secondary hyperparathyroidism of renal origin: Secondary | ICD-10-CM | POA: Diagnosis not present

## 2018-01-02 DIAGNOSIS — E1029 Type 1 diabetes mellitus with other diabetic kidney complication: Secondary | ICD-10-CM | POA: Diagnosis not present

## 2018-01-02 DIAGNOSIS — D509 Iron deficiency anemia, unspecified: Secondary | ICD-10-CM | POA: Diagnosis not present

## 2018-01-02 DIAGNOSIS — N186 End stage renal disease: Secondary | ICD-10-CM | POA: Diagnosis not present

## 2018-01-02 DIAGNOSIS — D689 Coagulation defect, unspecified: Secondary | ICD-10-CM | POA: Diagnosis not present

## 2018-01-02 DIAGNOSIS — Z992 Dependence on renal dialysis: Secondary | ICD-10-CM | POA: Diagnosis not present

## 2018-01-04 ENCOUNTER — Encounter: Payer: Self-pay | Admitting: *Deleted

## 2018-01-04 ENCOUNTER — Other Ambulatory Visit: Payer: Self-pay | Admitting: *Deleted

## 2018-01-04 NOTE — Progress Notes (Signed)
Pre-op instructions called to patient. Instructed to hold Plavix x 5 days pre-op continue ASA. To be at [facility] admitting department at 7 am on 01/11/18 for surgery. NPO past MN night prior and to expect a call and follow the detailed instructions received from the [facility] pre-admission department about this surgery. Must have driver for D/C. Letter of instructions faxed to [facility] to give to patient.

## 2018-01-04 NOTE — Progress Notes (Signed)
Request for [facility] exchange from Dr. Donnie Aho [facility] HD is on T,TH,Sat.

## 2018-01-05 DIAGNOSIS — D509 Iron deficiency anemia, unspecified: Secondary | ICD-10-CM | POA: Diagnosis not present

## 2018-01-05 DIAGNOSIS — Z992 Dependence on renal dialysis: Secondary | ICD-10-CM | POA: Diagnosis not present

## 2018-01-05 DIAGNOSIS — D689 Coagulation defect, unspecified: Secondary | ICD-10-CM | POA: Diagnosis not present

## 2018-01-05 DIAGNOSIS — E1029 Type 1 diabetes mellitus with other diabetic kidney complication: Secondary | ICD-10-CM | POA: Diagnosis not present

## 2018-01-05 DIAGNOSIS — N2581 Secondary hyperparathyroidism of renal origin: Secondary | ICD-10-CM | POA: Diagnosis not present

## 2018-01-05 DIAGNOSIS — N186 End stage renal disease: Secondary | ICD-10-CM | POA: Diagnosis not present

## 2018-01-07 DIAGNOSIS — N2581 Secondary hyperparathyroidism of renal origin: Secondary | ICD-10-CM | POA: Diagnosis not present

## 2018-01-07 DIAGNOSIS — D509 Iron deficiency anemia, unspecified: Secondary | ICD-10-CM | POA: Diagnosis not present

## 2018-01-07 DIAGNOSIS — Z992 Dependence on renal dialysis: Secondary | ICD-10-CM | POA: Diagnosis not present

## 2018-01-07 DIAGNOSIS — D689 Coagulation defect, unspecified: Secondary | ICD-10-CM | POA: Diagnosis not present

## 2018-01-07 DIAGNOSIS — E1029 Type 1 diabetes mellitus with other diabetic kidney complication: Secondary | ICD-10-CM | POA: Diagnosis not present

## 2018-01-07 DIAGNOSIS — N186 End stage renal disease: Secondary | ICD-10-CM | POA: Diagnosis not present

## 2018-01-08 ENCOUNTER — Encounter (HOSPITAL_COMMUNITY): Payer: Self-pay | Admitting: Emergency Medicine

## 2018-01-08 ENCOUNTER — Other Ambulatory Visit: Payer: Self-pay

## 2018-01-08 NOTE — Progress Notes (Signed)
Anesthesia Chart Review:  Pt is a same day work up   Case:  [id number] Date/Time:  01/11/18 1000   Procedure:  EXCHANGE OF TUNNELED DIALYSIS CATHETER (N/A )   Anesthesia type:  Monitor Anesthesia Care   Pre-op diagnosis:  END STAGE RENAL DISEASE FOR HEMODIALYSIS ACCESS   Location:  [place] OR ROOM 6 / [place] OR   Surgeon:  Rosetta Posner, MD      DISCUSSION: - Pt is a 62 year old male with hx CAD with anomalous left main (off RCA) status post CABG in 2542, chronic diastolic CHF, ESRD status post kidney transplant in 2009(failed, now back on dialysis), PAD, DM, atrial flutter status post ablation in 09/24/16  - Cardiac cath 12/04/17 showed CTO of LAD as cause of angina; no easy intervention; medical management elected  -To hold Plavix x 5 days prior to surgery and continue ASA perioperatively    PROVIDERS: PCP is Nolene Ebbs, MD Cardiologist is Dorris Carnes, MD EP cardiologist is Will Curt Bears, MD   LABS: Will be obtained day of surgery   IMAGES:  CXR 12/15/17: Mild left basilar atelectasis.   EKG 11/27/17: sinus rhythm with PACs. T wave abnormality, consider lateral ischemia. 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  Total occlusion of the proximal to mid LAD filling late by left to left and right to left collaterals.  First diagonal contains 50% proximal narrowing.  LM trunk is normal.  CX gives origin to 3 OM branches.  OM1 contains 50% proximal narrowing.  RCA is dominant giving PDA and left ventricular branch.  Diffuse calcified 60% stenosis is noted throughout the mid vessel ending in the distal segment.  No high-grade focal obstruction is seen.  Left ventriculogram and LV pressures were not recorded. - RECOMMENDATIONS:  Angina is related to total occlusion of the proximal to mid LAD.  LIMA to the LAD is atretic.  Because of the anomalous origin of the LM from the right sinus of  Valsalva, CTO intervention would be very difficult and therefore he is not an ideal candidate until medical therapy efforts are exhausted.  Echo 01/25/16:  - Left ventricle: There was mild concentric hypertrophy. Systolic function was mildly reduced. The estimated ejection fraction was in the range of 45% to 50%. Diffuse hypokinesis. Features are consistent with a pseudonormal left ventricular filling pattern, with concomitant abnormal relaxation and increased filling pressure (grade 2 diastolic dysfunction). Doppler parameters are consistent with both elevated ventricular end-diastolic filling pressure and elevated left atrial filling pressure. - Aortic valve: Normal-sized, mildly calcified annulus. Trileaflet.Moderate calcification involving the left coronary and noncoronary cusp. There was very mild stenosis. Mean gradient (S): 13 mm Hg. Peak gradient (S): 26 mm Hg. Valve area (VTI): 1.02 cm^2. Valve area (Vmax): 0.86 cm^2. Valve area (Vmean): 0.9 cm^2. - Mitral valve: Calcified annulus. There was mild regurgitation. Valve area by continuity equation (using LVOT flow): 1.35 cm^2. - Left atrium: The atrium was mildly dilated. - Atrial septum: No defect or patent foramen ovale was identified.   Past Medical History:  Diagnosis Date  . Arthritis    "back" (09/24/2016)  . CHF (congestive heart failure) ([place])   . Coronary artery disease   . Dysrhythmia    aflutter s/p ablation 2018  . ESRD (end stage renal disease) on dialysis [facility])    "TTS; [place]" (09/24/2016)  . Gout   . History  of blood transfusion 2009- 2016   "I've had several; low HgB"  . History of kidney stones    treated with nephrectomy  . Hypertension   . Impotence of organic origin   . Migraine    "stopped in my 30's; they were related to high BP" (09/24/2016)  . Myocardial infarction [facility]) '96  . Pneumonia 01/2015   " a touch & I was in the hosp."  . Secondary hyperparathyroidism ([place])   . Type 2 diabetes mellitus (HCC)     no longer on medication since going on dialysis    Past Surgical History:  Procedure Laterality Date  . A-FLUTTER ABLATION N/A 09/24/2016   Procedure: A-Flutter Ablation;  Surgeon: Will Meredith Leeds, MD;  Location: [place] CV LAB;  Service: Cardiovascular;  Laterality: N/A;  . ABDOMINAL AORTOGRAM W/LOWER EXTREMITY N/A 04/13/2017   Procedure: ABDOMINAL AORTOGRAM W/LOWER EXTREMITY;  Surgeon: Conrad Rutherford, MD;  Location: [place] CV LAB;  Service: Cardiovascular;  Laterality: N/A;  Bilater lower extermity  . APPENDECTOMY    . AV FISTULA PLACEMENT Right 09/18/2014   Procedure: INSERTION OF ARTERIOVENOUS (AV) GORE-TEX GRAFT ARM USING 4-7MM  X 45CM STRETCH GORE-TEX VASCULAR GRAFT;  Surgeon: Rosetta Posner, MD;  Location: [place];  Service: Vascular;  Laterality: Right;  . AV FISTULA PLACEMENT Left 07/07/2016   Procedure: INSERTION OF LEFT BRACHIAL TO AXILLARY ARTERIOVENOUS (AV) GORE-TEX ARM GRAFT;  Surgeon: Conrad [place], MD;  Location: [place];  Service: Vascular;  Laterality: Left;  . CARDIAC CATHETERIZATION  ~ 2016  . 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 HERNIA REPAIR  2017   with nephrectomy  . INSERTION OF DIALYSIS CATHETER N/A 10/08/2017   Procedure: INSERTION OF TUNNELED DIALYSIS CATHETER;  Surgeon: Conrad Dows, MD;  Location: [place];  Service: Vascular;  Laterality: N/A;  . IR GENERIC HISTORICAL  05/11/2016   IR FLUORO GUIDE CV LINE LEFT 05/11/2016 Corrie Mckusick, DO [facility]-INTERV RAD  . IR GENERIC HISTORICAL  05/11/2016   IR US GUIDE VASC ACCESS LEFT 05/11/2016 Corrie Mckusick, DO [facility]-INTER[facility]  . IR GENERIC HISTORICAL  05/11/2016   IR US GUIDE VASC ACCESS RIGHT 05/11/2016 Corrie Mckusick, DO [facility]-INTER[facility]  . IR GENERIC HISTORICAL  05/11/2016   IR RADIOLOGY PERIPHERAL GUIDED IV START 05/11/2016 Corrie Mckusick, DO [facility]-INTER[facility]  . KIDNEY TRANSPLANT  2009  . LEFT HEART CATH AND CORS/GRAFTS ANGIOGRAPHY N/A 12/04/2017    Procedure: LEFT HEART CATH AND CORS/GRAFTS ANGIOGRAPHY;  Surgeon: Belva Crome, MD;  Location: [place] CV LAB;  Service: Cardiovascular;  Laterality: N/A;  . NEPHRECTOMY  2017   transplant rejected   . PERIPHERAL VASCULAR CATHETERIZATION N/A 06/04/2016   Procedure: Upper Extremity Venography;  Surgeon: Waynetta Sandy, MD;  Location: [place] CV LAB;  Service: Cardiovascular;  Laterality: N/A;  . PERIPHERAL VASCULAR INTERVENTION  04/13/2017   Procedure: PERIPHERAL VASCULAR INTERVENTION;  Surgeon: Conrad Angola, MD;  Location: [place] CV LAB;  Service: Cardiovascular;;  Lt. Common/Exernal  Iliac  . THROMBECTOMY AND REVISION OF ARTERIOVENTOUS (AV) GORETEX  GRAFT Left 10/08/2017   Procedure: THROMBECTOMY of ARTERIOVENTOUS (AV) GORETEX  GRAFT LEFT UPPER ARM;  Surgeon: Conrad Sunizona, MD;  Location: [place];  Service: Vascular;  Laterality: Left;  . THROMBECTOMY W/ EMBOLECTOMY Left 09/14/2017   Procedure: THROMBECTOMY ARTERIOVENOUS GORE-TEX GRAFT LEFT UPPER ARM;  Surgeon: Rosetta Posner, MD;  Location: [place];  Service:  Vascular;  Laterality: Left;  . UPPER EXTREMITY VENOGRAPHY N/A 11/16/2017   Procedure: UPPER EXTREMITY VENOGRAPHY - Right Arm;  Surgeon: Conrad Kingstree, MD;  Location: [place] CV LAB;  Service: Cardiovascular;  Laterality: N/A;    MEDICATIONS: No current facility-administered medications for this encounter.    Marland Kitchen acetaminophen (TYLENOL) 500 MG tablet  . albuterol (PROVENTIL HFA;VENTOLIN HFA) 108 (90 Base) MCG/ACT inhaler  . amLODipine (NORVASC) 5 MG tablet  . aspirin EC 81 MG tablet  . calcium acetate (PHOSLO) 667 MG capsule  . CALCIUM CARBONATE-VITAMIN D PO  . clopidogrel (PLAVIX) 75 MG tablet  . isosorbide mononitrate (IMDUR) 60 MG 24 hr tablet  . repaglinide (PRANDIN) 0.5 MG tablet  . rosuvastatin (CRESTOR) 10 MG tablet  . diphenhydrAMINE (BENADRYL) 50 MG tablet  . predniSONE (DELTASONE) 50 MG tablet  . traMADol (ULTRAM) 50 MG tablet   -To hold Plavix x  5 days prior to surgery and continue ASA perioperatively    If labs acceptable day of surgery, I anticipate pt can proceed with surgery as scheduled.  Willeen Cass, FNP-BC [facility]/Anesthesiology Phone: [phone] 01/08/2018 11:57 AM

## 2018-01-08 NOTE — Progress Notes (Signed)
   01/08/18 1234  Patrick [name]  Have you ever been diagnosed with sleep apnea through a sleep study? No  Do you snore loudly (loud enough to be heard through closed doors)?  1  Do you often feel tired, fatigued, or sleepy during the daytime (such as falling asleep during driving or talking to someone)? 0  Has anyone observed you stop breathing during your sleep? 1  Do you have, or are you being treated for high blood pressure? 1  BMI more than 35 kg/m2?  (assess dos)  Age > 50 (1-yes) 1  Neck circumference greater than:Male 16 inches or larger, Male 17inches or larger?  (assess dos)  Male Gender (Yes=1) 1  Obstructive Sleep Apnea Score 5

## 2018-01-08 NOTE — Progress Notes (Signed)
Pt denies any acute cardiopulmonary issues. Pt under the care of Dr. [name]. Pt stated that he is no longer considered diabetic, he takes Prandin for " tingling in the toes." Pt made aware to not take Prandin DOS. Pt stated that he does not check his blood glucose. Pt made aware to stop taking otc vitamins, fish oil and herbal medications  Do not take any NSAIDs ie: Ibuprofen, Advil, Naproxen (Aleve), Motrin, BC and Goody Powder. Pt stated that last dose of Plavix was Monday. Pt verbalized understanding of all pre-op instructions. Anesthesia reviewed pt history (see note).

## 2018-01-09 ENCOUNTER — Other Ambulatory Visit: Payer: Self-pay | Admitting: Physician Assistant

## 2018-01-09 DIAGNOSIS — E1029 Type 1 diabetes mellitus with other diabetic kidney complication: Secondary | ICD-10-CM | POA: Diagnosis not present

## 2018-01-09 DIAGNOSIS — N186 End stage renal disease: Secondary | ICD-10-CM | POA: Diagnosis not present

## 2018-01-09 DIAGNOSIS — D689 Coagulation defect, unspecified: Secondary | ICD-10-CM | POA: Diagnosis not present

## 2018-01-09 DIAGNOSIS — Z992 Dependence on renal dialysis: Secondary | ICD-10-CM | POA: Diagnosis not present

## 2018-01-09 DIAGNOSIS — D509 Iron deficiency anemia, unspecified: Secondary | ICD-10-CM | POA: Diagnosis not present

## 2018-01-09 DIAGNOSIS — N2581 Secondary hyperparathyroidism of renal origin: Secondary | ICD-10-CM | POA: Diagnosis not present

## 2018-01-11 ENCOUNTER — Ambulatory Visit (HOSPITAL_COMMUNITY): Payer: Medicare Other | Admitting: Emergency Medicine

## 2018-01-11 ENCOUNTER — Other Ambulatory Visit: Payer: Self-pay

## 2018-01-11 ENCOUNTER — Ambulatory Visit (HOSPITAL_COMMUNITY): Payer: Medicare Other

## 2018-01-11 ENCOUNTER — Ambulatory Visit (HOSPITAL_COMMUNITY)
Admission: RE | Admit: 2018-01-11 | Discharge: 2018-01-11 | Disposition: A | Discharge status: Home / Self Care | Payer: Medicare Other | Source: Ambulatory Visit | Attending: Vascular Surgery | Admitting: Vascular Surgery

## 2018-01-11 ENCOUNTER — Encounter (HOSPITAL_COMMUNITY)
Admission: RE | Disposition: A | Discharge status: Home / Self Care | Payer: Self-pay | Source: Ambulatory Visit | Attending: Vascular Surgery

## 2018-01-11 ENCOUNTER — Encounter (HOSPITAL_COMMUNITY): Payer: Self-pay | Admitting: *Deleted

## 2018-01-11 DIAGNOSIS — I252 Old myocardial infarction: Secondary | ICD-10-CM | POA: Insufficient documentation

## 2018-01-11 DIAGNOSIS — Y828 Other medical devices associated with adverse incidents: Secondary | ICD-10-CM | POA: Diagnosis not present

## 2018-01-11 DIAGNOSIS — Z940 Kidney transplant status: Secondary | ICD-10-CM | POA: Diagnosis not present

## 2018-01-11 DIAGNOSIS — I2510 Atherosclerotic heart disease of native coronary artery without angina pectoris: Secondary | ICD-10-CM | POA: Insufficient documentation

## 2018-01-11 DIAGNOSIS — Z87891 Personal history of nicotine dependence: Secondary | ICD-10-CM | POA: Diagnosis not present

## 2018-01-11 DIAGNOSIS — T82898A Other specified complication of vascular prosthetic devices, implants and grafts, initial encounter: Secondary | ICD-10-CM | POA: Diagnosis not present

## 2018-01-11 DIAGNOSIS — N186 End stage renal disease: Secondary | ICD-10-CM | POA: Diagnosis not present

## 2018-01-11 DIAGNOSIS — Z6836 Body mass index (BMI) 36.0-36.9, adult: Secondary | ICD-10-CM | POA: Insufficient documentation

## 2018-01-11 DIAGNOSIS — E1122 Type 2 diabetes mellitus with diabetic chronic kidney disease: Secondary | ICD-10-CM | POA: Diagnosis not present

## 2018-01-11 DIAGNOSIS — E1151 Type 2 diabetes mellitus with diabetic peripheral angiopathy without gangrene: Secondary | ICD-10-CM | POA: Diagnosis not present

## 2018-01-11 DIAGNOSIS — N529 Male erectile dysfunction, unspecified: Secondary | ICD-10-CM | POA: Diagnosis not present

## 2018-01-11 DIAGNOSIS — Z7902 Long term (current) use of antithrombotics/antiplatelets: Secondary | ICD-10-CM | POA: Diagnosis not present

## 2018-01-11 DIAGNOSIS — Z452 Encounter for adjustment and management of vascular access device: Secondary | ICD-10-CM | POA: Diagnosis not present

## 2018-01-11 DIAGNOSIS — Z992 Dependence on renal dialysis: Secondary | ICD-10-CM | POA: Insufficient documentation

## 2018-01-11 DIAGNOSIS — I5032 Chronic diastolic (congestive) heart failure: Secondary | ICD-10-CM | POA: Insufficient documentation

## 2018-01-11 DIAGNOSIS — Z888 Allergy status to other drugs, medicaments and biological substances status: Secondary | ICD-10-CM | POA: Insufficient documentation

## 2018-01-11 DIAGNOSIS — Z79899 Other long term (current) drug therapy: Secondary | ICD-10-CM | POA: Insufficient documentation

## 2018-01-11 DIAGNOSIS — M479 Spondylosis, unspecified: Secondary | ICD-10-CM | POA: Insufficient documentation

## 2018-01-11 DIAGNOSIS — Z951 Presence of aortocoronary bypass graft: Secondary | ICD-10-CM | POA: Insufficient documentation

## 2018-01-11 DIAGNOSIS — I132 Hypertensive heart and chronic kidney disease with heart failure and with stage 5 chronic kidney disease, or end stage renal disease: Secondary | ICD-10-CM | POA: Diagnosis not present

## 2018-01-11 DIAGNOSIS — Z7982 Long term (current) use of aspirin: Secondary | ICD-10-CM | POA: Diagnosis not present

## 2018-01-11 DIAGNOSIS — Z91041 Radiographic dye allergy status: Secondary | ICD-10-CM | POA: Diagnosis not present

## 2018-01-11 DIAGNOSIS — E669 Obesity, unspecified: Secondary | ICD-10-CM | POA: Insufficient documentation

## 2018-01-11 DIAGNOSIS — T8249XA Other complication of vascular dialysis catheter, initial encounter: Secondary | ICD-10-CM | POA: Diagnosis not present

## 2018-01-11 DIAGNOSIS — N2581 Secondary hyperparathyroidism of renal origin: Secondary | ICD-10-CM | POA: Insufficient documentation

## 2018-01-11 DIAGNOSIS — M109 Gout, unspecified: Secondary | ICD-10-CM | POA: Diagnosis not present

## 2018-01-11 DIAGNOSIS — Z419 Encounter for procedure for purposes other than remedying health state, unspecified: Secondary | ICD-10-CM

## 2018-01-11 HISTORY — DX: Atherosclerotic heart disease of native coronary artery without angina pectoris: I25.10

## 2018-01-11 HISTORY — PX: EXCHANGE OF A DIALYSIS CATHETER: SHX5818

## 2018-01-11 HISTORY — DX: Ventral hernia without obstruction or gangrene: K43.9

## 2018-01-11 HISTORY — DX: Presence of spectacles and contact lenses: Z97.3

## 2018-01-11 LAB — POCT I-STAT 4, (NA,K, GLUC, HGB,HCT)
Glucose, Bld: 115 mg/dL — ABNORMAL HIGH (ref 65–99)
HCT: 30 % — ABNORMAL LOW (ref 39.0–52.0)
HEMOGLOBIN: 10.2 g/dL — AB (ref 13.0–17.0)
POTASSIUM: 4.7 mmol/L (ref 3.5–5.1)
Sodium: 138 mmol/L (ref 135–145)

## 2018-01-11 LAB — GLUCOSE, CAPILLARY: Glucose-Capillary: 106 mg/dL — ABNORMAL HIGH (ref 65–99)

## 2018-01-11 SURGERY — EXCHANGE OF A DIALYSIS CATHETER
Anesthesia: Monitor Anesthesia Care | Site: Chest | Laterality: Left

## 2018-01-11 MED ORDER — 0.9 % SODIUM CHLORIDE (POUR BTL) OPTIME
TOPICAL | Status: DC | PRN
  Administered 2018-01-11: 1000 mL

## 2018-01-11 MED ORDER — LIDOCAINE-EPINEPHRINE 0.5 %-1:200000 IJ SOLN
INTRAMUSCULAR | Status: DC | PRN
  Administered 2018-01-11: 50 mL

## 2018-01-11 MED ORDER — ONDANSETRON HCL 4 MG/2ML IJ SOLN
INTRAMUSCULAR | Status: AC
  Filled 2018-01-11: qty 2

## 2018-01-11 MED ORDER — LIDOCAINE 2% (20 MG/ML) 5 ML SYRINGE
INTRAMUSCULAR | Status: AC
  Filled 2018-01-11: qty 5

## 2018-01-11 MED ORDER — PROMETHAZINE HCL 25 MG/ML IJ SOLN: 6.2500 mg | INTRAMUSCULAR | Status: DC | PRN

## 2018-01-11 MED ORDER — PHENYLEPHRINE 40 MCG/ML (10ML) SYRINGE FOR IV PUSH (FOR BLOOD PRESSURE SUPPORT)
PREFILLED_SYRINGE | INTRAVENOUS | Status: DC | PRN
Start: 2018-01-11 — End: 2018-01-11
  Administered 2018-01-11 (×3): 80 ug via INTRAVENOUS
  Administered 2018-01-11: 120 ug via INTRAVENOUS
  Administered 2018-01-11: 80 ug via INTRAVENOUS

## 2018-01-11 MED ORDER — PROPOFOL 10 MG/ML IV BOLUS
INTRAVENOUS | Status: AC
  Filled 2018-01-11: qty 20

## 2018-01-11 MED ORDER — FENTANYL CITRATE (PF) 100 MCG/2ML IJ SOLN: 25.0000 ug | INTRAMUSCULAR | Status: DC | PRN

## 2018-01-11 MED ORDER — CHLORHEXIDINE GLUCONATE 4 % EX LIQD: 60.0000 mL | Freq: Once | CUTANEOUS | Status: DC

## 2018-01-11 MED ORDER — MIDAZOLAM HCL 5 MG/5ML IJ SOLN
INTRAMUSCULAR | Status: DC | PRN
  Administered 2018-01-11: 2 mg via INTRAVENOUS

## 2018-01-11 MED ORDER — HEPARIN SODIUM (PORCINE) 1000 UNIT/ML IJ SOLN
INTRAMUSCULAR | Status: AC
  Filled 2018-01-11: qty 1

## 2018-01-11 MED ORDER — SODIUM CHLORIDE 0.9 % IV SOLN
INTRAVENOUS | Status: DC | PRN
  Administered 2018-01-11: 09:00:00

## 2018-01-11 MED ORDER — LIDOCAINE-EPINEPHRINE 0.5 %-1:200000 IJ SOLN
INTRAMUSCULAR | Status: AC
  Filled 2018-01-11: qty 1

## 2018-01-11 MED ORDER — HEPARIN SODIUM (PORCINE) 1000 UNIT/ML IJ SOLN
INTRAMUSCULAR | Status: DC | PRN
  Administered 2018-01-11: 1000 [IU]

## 2018-01-11 MED ORDER — SODIUM CHLORIDE 0.9 % IV SOLN
INTRAVENOUS | Status: AC
  Filled 2018-01-11: qty 1.2

## 2018-01-11 MED ORDER — MIDAZOLAM HCL 2 MG/2ML IJ SOLN
INTRAMUSCULAR | Status: AC
  Filled 2018-01-11: qty 2

## 2018-01-11 MED ORDER — CEFAZOLIN SODIUM-DEXTROSE 2-4 GM/100ML-% IV SOLN
INTRAVENOUS | Status: AC
  Filled 2018-01-11: qty 100

## 2018-01-11 MED ORDER — FENTANYL CITRATE (PF) 250 MCG/5ML IJ SOLN
INTRAMUSCULAR | Status: AC
  Filled 2018-01-11: qty 5

## 2018-01-11 MED ORDER — PHENYLEPHRINE 40 MCG/ML (10ML) SYRINGE FOR IV PUSH (FOR BLOOD PRESSURE SUPPORT)
PREFILLED_SYRINGE | INTRAVENOUS | Status: AC
  Filled 2018-01-11: qty 10

## 2018-01-11 MED ORDER — SODIUM CHLORIDE 0.9 % IV SOLN
INTRAVENOUS | Status: DC
  Administered 2018-01-11: 08:00:00 via INTRAVENOUS

## 2018-01-11 MED ORDER — CEFAZOLIN SODIUM-DEXTROSE 2-4 GM/100ML-% IV SOLN
2.0000 g | INTRAVENOUS | Status: AC
  Administered 2018-01-11: 2 g via INTRAVENOUS

## 2018-01-11 MED ORDER — LIDOCAINE 2% (20 MG/ML) 5 ML SYRINGE
INTRAMUSCULAR | Status: DC | PRN
  Administered 2018-01-11: 20 mg via INTRAVENOUS

## 2018-01-11 MED ORDER — PHENYLEPHRINE 40 MCG/ML (10ML) SYRINGE FOR IV PUSH (FOR BLOOD PRESSURE SUPPORT)
PREFILLED_SYRINGE | INTRAVENOUS | Status: AC
  Filled 2018-01-11: qty 30

## 2018-01-11 MED ORDER — PROPOFOL 500 MG/50ML IV EMUL
INTRAVENOUS | Status: DC | PRN
  Administered 2018-01-11: 75 ug/kg/min via INTRAVENOUS

## 2018-01-11 MED ORDER — ONDANSETRON HCL 4 MG/2ML IJ SOLN
INTRAMUSCULAR | Status: DC | PRN
  Administered 2018-01-11: 4 mg via INTRAVENOUS

## 2018-01-11 SURGICAL SUPPLY — 38 items
BAG DECANTER FOR FLEXI CONT (MISCELLANEOUS) ×3 IMPLANT
BIOPATCH RED 1 DISK 7.0 (GAUZE/BANDAGES/DRESSINGS) ×2 IMPLANT
BIOPATCH RED 1IN DISK 7.0MM (GAUZE/BANDAGES/DRESSINGS) ×1
CATH PALINDROME RT-P 15FX19CM (CATHETERS) IMPLANT
CATH PALINDROME RT-P 15FX23CM (CATHETERS) IMPLANT
CATH PALINDROME RT-P 15FX28CM (CATHETERS) ×3 IMPLANT
CATH PALINDROME RT-P 15FX55CM (CATHETERS) IMPLANT
COVER PROBE W GEL 5X96 (DRAPES) ×3 IMPLANT
COVER SURGICAL LIGHT HANDLE (MISCELLANEOUS) ×3 IMPLANT
DECANTER SPIKE VIAL GLASS SM (MISCELLANEOUS) ×3 IMPLANT
DERMABOND ADVANCED (GAUZE/BANDAGES/DRESSINGS) ×2
DERMABOND ADVANCED .7 DNX12 (GAUZE/BANDAGES/DRESSINGS) ×1 IMPLANT
DRAPE C-ARM 42X72 X-RAY (DRAPES) ×3 IMPLANT
DRAPE CHEST BREAST 15X10 FENES (DRAPES) ×3 IMPLANT
GLOVE BIO SURGEON STRL SZ7 (GLOVE) ×3 IMPLANT
GLOVE BIOGEL PI IND STRL 7.0 (GLOVE) ×2 IMPLANT
GLOVE BIOGEL PI INDICATOR 7.0 (GLOVE) ×4
GLOVE SS BIOGEL STRL SZ 7.5 (GLOVE) ×1 IMPLANT
GLOVE SUPERSENSE BIOGEL SZ 7.5 (GLOVE) ×2
GOWN STRL REUS W/ TWL LRG LVL3 (GOWN DISPOSABLE) ×3 IMPLANT
GOWN STRL REUS W/TWL LRG LVL3 (GOWN DISPOSABLE) ×6
KIT BASIN OR (CUSTOM PROCEDURE TRAY) ×3 IMPLANT
KIT TURNOVER KIT B (KITS) ×3 IMPLANT
NEEDLE 18GX1X1/2 (RX/OR ONLY) (NEEDLE) ×3 IMPLANT
NEEDLE 22X1 1/2 (OR ONLY) (NEEDLE) ×3 IMPLANT
NEEDLE HYPO 25GX1X1/2 BEV (NEEDLE) ×3 IMPLANT
NS IRRIG 1000ML POUR BTL (IV SOLUTION) ×3 IMPLANT
PACK SURGICAL SETUP 50X90 (CUSTOM PROCEDURE TRAY) ×3 IMPLANT
PAD ARMBOARD 7.5X6 YLW CONV (MISCELLANEOUS) ×6 IMPLANT
SOAP 2 % CHG 4 OZ (WOUND CARE) ×3 IMPLANT
SUT ETHILON 3 0 PS 1 (SUTURE) ×3 IMPLANT
SUT VICRYL 4-0 PS2 18IN ABS (SUTURE) ×3 IMPLANT
SYR 10ML LL (SYRINGE) ×3 IMPLANT
SYR 20CC LL (SYRINGE) ×3 IMPLANT
SYR 5ML LL (SYRINGE) ×6 IMPLANT
SYR CONTROL 10ML LL (SYRINGE) ×3 IMPLANT
TOWEL GREEN STERILE (TOWEL DISPOSABLE) ×3 IMPLANT
WATER STERILE IRR 1000ML POUR (IV SOLUTION) ×3 IMPLANT

## 2018-01-11 NOTE — Telephone Encounter (Signed)
Prescribing Provider Encounter Provider  Liliane Shi, PA-C Liliane Shi, PA-C  Supervision Information   Supervising Provider Type of Supervision  Dorothy Spark, MD Incident To  Outpatient Medication Detail    Disp Refills Start End   albuterol (PROVENTIL HFA;VENTOLIN HFA) 108 (90 Base) MCG/ACT inhaler 1 Inhaler 0 12/18/2017    Sig - Route: Inhale 1-2 puffs into the lungs every 6 (six) hours as needed for wheezing or shortness of breath. - Inhalation   Sent to pharmacy as: albuterol (PROVENTIL HFA;VENTOLIN HFA) 108 (90 Base) MCG/ACT inhaler   Notes to Pharmacy: >>>THIS IS A 1 TIME REFILL ; FURTHER REFILLS TO BE FILLED BY PCP<<<   E-Prescribing Status: Receipt confirmed by pharmacy (12/18/2017 11:43 AM EDT)   Pharmacy   [id number] - [place], [place] - [address] AT [address]

## 2018-01-11 NOTE — Op Note (Signed)
    OPERATIVE REPORT  DATE OF SURGERY: 01/11/2018  PATIENT: Patrick [name], 62 y.o. male MRN: [id number]  DOB: Apr 18, 1956  PRE-OPERATIVE DIAGNOSIS: End-stage renal disease with poorly functioning left IJ hemodialysis catheter  POST-OPERATIVE DIAGNOSIS:  Same  PROCEDURE: Exchange of left IJ tunneled hemodialysis catheter  SURGEON:  Curt Jews, M.D.  PHYSICIAN ASSISTANT: Nurse  ANESTHESIA: MAC  EBL: Minimal ml  Total I/O In: -  Out: 20 [Blood:20]  BLOOD ADMINISTERED: None  DRAINS: None  SPECIMEN: None  COUNTS CORRECT:  YES  PLAN OF CARE: PACU  PATIENT DISPOSITION:  PACU - hemodynamically stable  PROCEDURE DETAILS: Patient was taken to the operating placed to position the area of the left neck and chest were prepped and draped in usual sterile fashion.  The patient had a current catheter and it was prepped as well.  Using local anesthesia incision was made over the IJ entry site and the catheter was exposed.  The catheter was grasped with a hemostat and was transected distally.  A guidewire was passed through the existing catheter and this was confirmed with fluoroscopy.  The existing catheter was removed in its entirety.  A dilator and peel-away sheath was passed over the guidewire and the 28 cm tunnel catheter was positioned through the peel-away sheath which was then removed.  The catheter tips were positioned 2 cm further distally then was seen on x-ray from May 2019.  Catheter was brought through a subcutaneous tunnel through a separate stab incision.  The 2 lm ports were attached and both lumens flushed and aspirated easily and were locked with 1008/cc heparin.  The catheter was secured to the skin with a 3-0 nylon stitch and the entry site was closed with a 4-0 subcuticular Vicryl stitch.  Both lumens flushed and aspirated easily and were locked with 1000/cc heparin.  The catheter was secured to the skin with a 3-0 nylon stitch and the entry site was closed with a 4  sub-particular Vicryl stitch.  Sterile dressing was applied and the patient was transferred to the recovery room in stable condition   Rosetta Posner, M.D., [facility] 01/11/2018 9:50 AM

## 2018-01-11 NOTE — H&P (Signed)
   Patient name: Patrick [name] MRN: [id number] DOB: 1956/01/24 Sex: male    HPI: Patrick [name] is a 62 y.o. male with end-stage renal disease here today for catheter exchange.  He has had multiple lateral upper extremity access which have all failed.  He had a right arm venogram with Dr. Geryl Councilman in April of this year showing occlusion of the innominate vein.  He does have a left IJ catheter which was placed in March and is having poor flow.  He is here today for replacement.  I did review his chest x-ray from May of this year.  The catheter is in the upper right atrium.  Current Facility-Administered Medications  Medication Dose Route Frequency Provider Last Rate Last Dose  . 0.9 %  sodium chloride infusion   Intravenous Continuous Dorothymae Maciver, Arvilla Meres, MD 10 mL/hr at 01/11/18 [phone]    . ceFAZolin (ANCEF) 2-4 GM/100ML-% IVPB           . ceFAZolin (ANCEF) IVPB 2g/100 mL premix  2 g Intravenous 30 min Pre-Op Arlester Keehan, Arvilla Meres, MD      . chlorhexidine (HIBICLENS) 4 % liquid 4 application  60 mL Topical Once Yoshio Seliga, Arvilla Meres, MD       And  . Derrill Memo ON 01/12/2018] chlorhexidine (HIBICLENS) 4 % liquid 4 application  60 mL Topical Once Eriko Economos, Arvilla Meres, MD         PHYSICAL EXAM: Vitals:   01/11/18 0702  BP: 116/75  Pulse: 80  Resp: 20  Temp: 98.4 F (36.9 C)  TempSrc: Oral  SpO2: 99%  Weight: 228 lb (103.4 kg)  Height: 5' 6.5" (1.689 m)    GENERAL: The patient is a well-nourished male, in no acute distress. The vital signs are documented above. Catheter dressing intact on the left chest  MEDICAL ISSUES: Here today for catheter replacement.  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  Will place new left tunneled dialysis catheter   Rosetta Posner, MD FACS [facility] Tel [phone] Pager [phone]

## 2018-01-11 NOTE — Anesthesia Preprocedure Evaluation (Signed)
Anesthesia Evaluation  Patient identified by MRN, date of birth, ID band Patient awake    Reviewed: Allergy & Precautions, NPO status , Patient's Chart, lab work & pertinent test results  Airway Mallampati: III  TM Distance: >3 FB Neck ROM: Full    Dental no notable dental hx. (+) Dental Advisory Given   Pulmonary former smoker,    Pulmonary exam normal breath sounds clear to auscultation       Cardiovascular hypertension, + CAD and + Past MI  Normal cardiovascular exam Rhythm:Regular Rate:Normal  ECG: NSR, rate 71  Nuclear stress EF: 53%. There is septal wall and apical hypokinesis There was no ST segment deviation noted during stress. Nonspecific T wave changes noted at baseline. Defect 1: There is a medium defect of severe severity present in the mid anteroseptal, apical anterior, apical septal, apical lateral and apex location. Findings consistent with prior myocardial infarction. No significant ischemia identified. This is an intermediate risk study.   Neuro/Psych  Headaches, negative psych ROS   GI/Hepatic negative GI ROS, Neg liver ROS,   Endo/Other  diabetes  Renal/GU ESRF and DialysisRenal diseaseOn HD T, R, Sat     Musculoskeletal negative musculoskeletal ROS (+)   Abdominal (+) + obese,   Peds  Hematology  (+) anemia ,   Anesthesia Other Findings   Reproductive/Obstetrics                             Anesthesia Physical  Anesthesia Plan  ASA: III  Anesthesia Plan: MAC   Post-op Pain Management:    Induction: Intravenous  PONV Risk Score and Plan: 2 and Ondansetron, Treatment may vary due to age or medical condition and Propofol infusion  Airway Management Planned: Natural Airway  Additional Equipment:   Intra-op Plan:   Post-operative Plan: Extubation in OR  Informed Consent: I have reviewed the patients History and Physical, chart, labs and discussed the  procedure including the risks, benefits and alternatives for the proposed anesthesia with the patient or authorized representative who has indicated his/her understanding and acceptance.   Dental advisory given  Plan Discussed with: CRNA and Anesthesiologist  Anesthesia Plan Comments:         Anesthesia Quick Evaluation

## 2018-01-11 NOTE — Anesthesia Postprocedure Evaluation (Signed)
Anesthesia Post Note  Patient: Patrick [name] A Mates  Procedure(s) Performed: EXCHANGE OF TUNNELED DIALYSIS CATHETER (Left Chest)     Patient location during evaluation: PACU Anesthesia Type: MAC Level of consciousness: awake and alert Pain management: pain level controlled Vital Signs Assessment: post-procedure vital signs reviewed and stable Respiratory status: spontaneous breathing and respiratory function stable Cardiovascular status: stable Postop Assessment: no apparent nausea or vomiting Anesthetic complications: no    Last Vitals:  Vitals:   01/11/18 1012 01/11/18 1016  BP: 98/67 104/71  Pulse: 74 69  Resp: 17 14  Temp:  (!) 36.3 C  SpO2: 100% 100%    Last Pain:  Vitals:   01/11/18 1020  TempSrc:   PainSc: 0-No pain                 Yailene Badia,Amdrew DANIEL

## 2018-01-11 NOTE — Progress Notes (Signed)
Patient in phase 2 awaiting SCAT for transport. SCAT scheduled to pick up at 4pm, but patient attempting to call for earlier pick up. Discharge instructions reviewed and family at bedside in phase 2.

## 2018-01-11 NOTE — Transfer of Care (Signed)
Immediate Anesthesia Transfer of Care Note  Patient: Patrick [name]  Procedure(s) Performed: EXCHANGE OF TUNNELED DIALYSIS CATHETER (Left Chest)  Patient Location: PACU  Anesthesia Type:MAC  Level of Consciousness: oriented, drowsy and patient cooperative  Airway & Oxygen Therapy: Patient Spontanous Breathing and Patient connected to face mask oxygen  Post-op Assessment: Report given to RN, Post -op Vital signs reviewed and stable and Patient moving all extremities X 4  Post vital signs: Reviewed and stable  Last Vitals:  Vitals Value Taken Time  BP 83/54 01/11/2018  9:52 AM  Temp 36.3 C 01/11/2018  9:50 AM  Pulse 73 01/11/2018  9:54 AM  Resp 21 01/11/2018  9:54 AM  SpO2 98 % 01/11/2018  9:54 AM  Vitals shown include unvalidated device data.  Last Pain:  Vitals:   01/11/18 0950  TempSrc:   PainSc: 0-No pain      Patients Stated Pain Goal: 0 (07/29/70 5366)  Complications: No apparent anesthesia complications

## 2018-01-12 ENCOUNTER — Encounter (HOSPITAL_COMMUNITY): Payer: Self-pay | Admitting: Vascular Surgery

## 2018-01-12 DIAGNOSIS — N186 End stage renal disease: Secondary | ICD-10-CM | POA: Diagnosis not present

## 2018-01-12 DIAGNOSIS — D509 Iron deficiency anemia, unspecified: Secondary | ICD-10-CM | POA: Diagnosis not present

## 2018-01-12 DIAGNOSIS — Z992 Dependence on renal dialysis: Secondary | ICD-10-CM | POA: Diagnosis not present

## 2018-01-12 DIAGNOSIS — E1029 Type 1 diabetes mellitus with other diabetic kidney complication: Secondary | ICD-10-CM | POA: Diagnosis not present

## 2018-01-12 DIAGNOSIS — D689 Coagulation defect, unspecified: Secondary | ICD-10-CM | POA: Diagnosis not present

## 2018-01-12 DIAGNOSIS — N2581 Secondary hyperparathyroidism of renal origin: Secondary | ICD-10-CM | POA: Diagnosis not present

## 2018-01-14 DIAGNOSIS — Z992 Dependence on renal dialysis: Secondary | ICD-10-CM | POA: Diagnosis not present

## 2018-01-14 DIAGNOSIS — E1029 Type 1 diabetes mellitus with other diabetic kidney complication: Secondary | ICD-10-CM | POA: Diagnosis not present

## 2018-01-14 DIAGNOSIS — N186 End stage renal disease: Secondary | ICD-10-CM | POA: Diagnosis not present

## 2018-01-14 DIAGNOSIS — D689 Coagulation defect, unspecified: Secondary | ICD-10-CM | POA: Diagnosis not present

## 2018-01-14 DIAGNOSIS — N2581 Secondary hyperparathyroidism of renal origin: Secondary | ICD-10-CM | POA: Diagnosis not present

## 2018-01-14 DIAGNOSIS — D509 Iron deficiency anemia, unspecified: Secondary | ICD-10-CM | POA: Diagnosis not present

## 2018-01-16 DIAGNOSIS — N186 End stage renal disease: Secondary | ICD-10-CM | POA: Diagnosis not present

## 2018-01-16 DIAGNOSIS — D689 Coagulation defect, unspecified: Secondary | ICD-10-CM | POA: Diagnosis not present

## 2018-01-16 DIAGNOSIS — E1029 Type 1 diabetes mellitus with other diabetic kidney complication: Secondary | ICD-10-CM | POA: Diagnosis not present

## 2018-01-16 DIAGNOSIS — N2581 Secondary hyperparathyroidism of renal origin: Secondary | ICD-10-CM | POA: Diagnosis not present

## 2018-01-16 DIAGNOSIS — Z992 Dependence on renal dialysis: Secondary | ICD-10-CM | POA: Diagnosis not present

## 2018-01-16 DIAGNOSIS — D509 Iron deficiency anemia, unspecified: Secondary | ICD-10-CM | POA: Diagnosis not present

## 2018-01-19 DIAGNOSIS — N186 End stage renal disease: Secondary | ICD-10-CM | POA: Diagnosis not present

## 2018-01-19 DIAGNOSIS — D509 Iron deficiency anemia, unspecified: Secondary | ICD-10-CM | POA: Diagnosis not present

## 2018-01-19 DIAGNOSIS — D689 Coagulation defect, unspecified: Secondary | ICD-10-CM | POA: Diagnosis not present

## 2018-01-19 DIAGNOSIS — N2581 Secondary hyperparathyroidism of renal origin: Secondary | ICD-10-CM | POA: Diagnosis not present

## 2018-01-19 DIAGNOSIS — Z992 Dependence on renal dialysis: Secondary | ICD-10-CM | POA: Diagnosis not present

## 2018-01-19 DIAGNOSIS — E1029 Type 1 diabetes mellitus with other diabetic kidney complication: Secondary | ICD-10-CM | POA: Diagnosis not present

## 2018-01-21 DIAGNOSIS — E1029 Type 1 diabetes mellitus with other diabetic kidney complication: Secondary | ICD-10-CM | POA: Diagnosis not present

## 2018-01-21 DIAGNOSIS — I4891 Unspecified atrial fibrillation: Secondary | ICD-10-CM | POA: Diagnosis not present

## 2018-01-21 DIAGNOSIS — D689 Coagulation defect, unspecified: Secondary | ICD-10-CM | POA: Diagnosis not present

## 2018-01-21 DIAGNOSIS — Z992 Dependence on renal dialysis: Secondary | ICD-10-CM | POA: Diagnosis not present

## 2018-01-21 DIAGNOSIS — D509 Iron deficiency anemia, unspecified: Secondary | ICD-10-CM | POA: Diagnosis not present

## 2018-01-21 DIAGNOSIS — N186 End stage renal disease: Secondary | ICD-10-CM | POA: Diagnosis not present

## 2018-01-21 DIAGNOSIS — N2581 Secondary hyperparathyroidism of renal origin: Secondary | ICD-10-CM | POA: Diagnosis not present

## 2018-01-23 DIAGNOSIS — Z992 Dependence on renal dialysis: Secondary | ICD-10-CM | POA: Diagnosis not present

## 2018-01-23 DIAGNOSIS — N2581 Secondary hyperparathyroidism of renal origin: Secondary | ICD-10-CM | POA: Diagnosis not present

## 2018-01-23 DIAGNOSIS — D689 Coagulation defect, unspecified: Secondary | ICD-10-CM | POA: Diagnosis not present

## 2018-01-23 DIAGNOSIS — E1029 Type 1 diabetes mellitus with other diabetic kidney complication: Secondary | ICD-10-CM | POA: Diagnosis not present

## 2018-01-23 DIAGNOSIS — N186 End stage renal disease: Secondary | ICD-10-CM | POA: Diagnosis not present

## 2018-01-23 DIAGNOSIS — D509 Iron deficiency anemia, unspecified: Secondary | ICD-10-CM | POA: Diagnosis not present

## 2018-01-25 DIAGNOSIS — T8610 Unspecified complication of kidney transplant: Secondary | ICD-10-CM | POA: Diagnosis not present

## 2018-01-25 DIAGNOSIS — N186 End stage renal disease: Secondary | ICD-10-CM | POA: Diagnosis not present

## 2018-01-25 DIAGNOSIS — Z992 Dependence on renal dialysis: Secondary | ICD-10-CM | POA: Diagnosis not present

## 2018-01-26 DIAGNOSIS — D631 Anemia in chronic kidney disease: Secondary | ICD-10-CM | POA: Diagnosis not present

## 2018-01-26 DIAGNOSIS — E1029 Type 1 diabetes mellitus with other diabetic kidney complication: Secondary | ICD-10-CM | POA: Diagnosis not present

## 2018-01-26 DIAGNOSIS — Z992 Dependence on renal dialysis: Secondary | ICD-10-CM | POA: Diagnosis not present

## 2018-01-26 DIAGNOSIS — N2581 Secondary hyperparathyroidism of renal origin: Secondary | ICD-10-CM | POA: Diagnosis not present

## 2018-01-26 DIAGNOSIS — D509 Iron deficiency anemia, unspecified: Secondary | ICD-10-CM | POA: Diagnosis not present

## 2018-01-26 DIAGNOSIS — D689 Coagulation defect, unspecified: Secondary | ICD-10-CM | POA: Diagnosis not present

## 2018-01-26 DIAGNOSIS — N186 End stage renal disease: Secondary | ICD-10-CM | POA: Diagnosis not present

## 2018-01-28 DIAGNOSIS — D509 Iron deficiency anemia, unspecified: Secondary | ICD-10-CM | POA: Diagnosis not present

## 2018-01-28 DIAGNOSIS — N2581 Secondary hyperparathyroidism of renal origin: Secondary | ICD-10-CM | POA: Diagnosis not present

## 2018-01-28 DIAGNOSIS — Z992 Dependence on renal dialysis: Secondary | ICD-10-CM | POA: Diagnosis not present

## 2018-01-28 DIAGNOSIS — D689 Coagulation defect, unspecified: Secondary | ICD-10-CM | POA: Diagnosis not present

## 2018-01-28 DIAGNOSIS — E1029 Type 1 diabetes mellitus with other diabetic kidney complication: Secondary | ICD-10-CM | POA: Diagnosis not present

## 2018-01-28 DIAGNOSIS — N186 End stage renal disease: Secondary | ICD-10-CM | POA: Diagnosis not present

## 2018-01-30 DIAGNOSIS — Z992 Dependence on renal dialysis: Secondary | ICD-10-CM | POA: Diagnosis not present

## 2018-01-30 DIAGNOSIS — D509 Iron deficiency anemia, unspecified: Secondary | ICD-10-CM | POA: Diagnosis not present

## 2018-01-30 DIAGNOSIS — N2581 Secondary hyperparathyroidism of renal origin: Secondary | ICD-10-CM | POA: Diagnosis not present

## 2018-01-30 DIAGNOSIS — D689 Coagulation defect, unspecified: Secondary | ICD-10-CM | POA: Diagnosis not present

## 2018-01-30 DIAGNOSIS — N186 End stage renal disease: Secondary | ICD-10-CM | POA: Diagnosis not present

## 2018-01-30 DIAGNOSIS — E1029 Type 1 diabetes mellitus with other diabetic kidney complication: Secondary | ICD-10-CM | POA: Diagnosis not present

## 2018-02-02 DIAGNOSIS — N2581 Secondary hyperparathyroidism of renal origin: Secondary | ICD-10-CM | POA: Diagnosis not present

## 2018-02-02 DIAGNOSIS — E1029 Type 1 diabetes mellitus with other diabetic kidney complication: Secondary | ICD-10-CM | POA: Diagnosis not present

## 2018-02-02 DIAGNOSIS — N186 End stage renal disease: Secondary | ICD-10-CM | POA: Diagnosis not present

## 2018-02-02 DIAGNOSIS — D509 Iron deficiency anemia, unspecified: Secondary | ICD-10-CM | POA: Diagnosis not present

## 2018-02-02 DIAGNOSIS — Z992 Dependence on renal dialysis: Secondary | ICD-10-CM | POA: Diagnosis not present

## 2018-02-02 DIAGNOSIS — D689 Coagulation defect, unspecified: Secondary | ICD-10-CM | POA: Diagnosis not present

## 2018-02-03 ENCOUNTER — Encounter (HOSPITAL_COMMUNITY): Payer: Medicare Other

## 2018-02-03 ENCOUNTER — Ambulatory Visit: Payer: Medicare Other | Admitting: Vascular Surgery

## 2018-02-04 DIAGNOSIS — N186 End stage renal disease: Secondary | ICD-10-CM | POA: Diagnosis not present

## 2018-02-04 DIAGNOSIS — D509 Iron deficiency anemia, unspecified: Secondary | ICD-10-CM | POA: Diagnosis not present

## 2018-02-04 DIAGNOSIS — Z992 Dependence on renal dialysis: Secondary | ICD-10-CM | POA: Diagnosis not present

## 2018-02-04 DIAGNOSIS — N2581 Secondary hyperparathyroidism of renal origin: Secondary | ICD-10-CM | POA: Diagnosis not present

## 2018-02-04 DIAGNOSIS — E1029 Type 1 diabetes mellitus with other diabetic kidney complication: Secondary | ICD-10-CM | POA: Diagnosis not present

## 2018-02-04 DIAGNOSIS — D689 Coagulation defect, unspecified: Secondary | ICD-10-CM | POA: Diagnosis not present

## 2018-02-06 DIAGNOSIS — N2581 Secondary hyperparathyroidism of renal origin: Secondary | ICD-10-CM | POA: Diagnosis not present

## 2018-02-06 DIAGNOSIS — D509 Iron deficiency anemia, unspecified: Secondary | ICD-10-CM | POA: Diagnosis not present

## 2018-02-06 DIAGNOSIS — E1029 Type 1 diabetes mellitus with other diabetic kidney complication: Secondary | ICD-10-CM | POA: Diagnosis not present

## 2018-02-06 DIAGNOSIS — N186 End stage renal disease: Secondary | ICD-10-CM | POA: Diagnosis not present

## 2018-02-06 DIAGNOSIS — Z992 Dependence on renal dialysis: Secondary | ICD-10-CM | POA: Diagnosis not present

## 2018-02-06 DIAGNOSIS — D689 Coagulation defect, unspecified: Secondary | ICD-10-CM | POA: Diagnosis not present

## 2018-02-09 DIAGNOSIS — D689 Coagulation defect, unspecified: Secondary | ICD-10-CM | POA: Diagnosis not present

## 2018-02-09 DIAGNOSIS — N186 End stage renal disease: Secondary | ICD-10-CM | POA: Diagnosis not present

## 2018-02-09 DIAGNOSIS — N2581 Secondary hyperparathyroidism of renal origin: Secondary | ICD-10-CM | POA: Diagnosis not present

## 2018-02-09 DIAGNOSIS — D509 Iron deficiency anemia, unspecified: Secondary | ICD-10-CM | POA: Diagnosis not present

## 2018-02-09 DIAGNOSIS — Z992 Dependence on renal dialysis: Secondary | ICD-10-CM | POA: Diagnosis not present

## 2018-02-09 DIAGNOSIS — E1029 Type 1 diabetes mellitus with other diabetic kidney complication: Secondary | ICD-10-CM | POA: Diagnosis not present

## 2018-02-09 NOTE — Progress Notes (Signed)
Established Dialysis Access   History of Present Illness   CHAKA JEFFERYS is a 62 y.o. (1955/11/14) male who presents for re-evaluation for permanent access.  The patient is right hand dominant.  Previous access procedures have been completed in both arms.  The patient's complication from previous access procedures include: thrombosis.    Recent R venogram demonstrates no R innominate vein drainage.  His LUA AVG TE failed due to transected stents.  The patient had a LIJV TDC exchanged recently.  The patient is considering peritoneal dialysis also.  Past Medical History:  Diagnosis Date  . Arthritis    "back" (09/24/2016)  . CHF (congestive heart failure) ([place])   . Coronary artery disease   . Dysrhythmia    aflutter s/p ablation 2018  . ESRD (end stage renal disease) on dialysis [facility])    "TTS; [place]" (09/24/2016)  . Gout   . Hernia of abdominal wall   . History of blood transfusion 2009- 2016   "I've had several; low HgB"  . History of kidney stones    treated with nephrectomy  . Hypertension   . Impotence of organic origin   . Migraine    "stopped in my 30's; they were related to high BP" (09/24/2016)  . Myocardial infarction [facility]) '96  . Pneumonia 01/2015   " a touch & I was in the hosp."  . Secondary hyperparathyroidism ([place])   . Type 2 diabetes mellitus (H[facility])    no longer on medication since going on dialysis  . Wears glasses     Past Surgical History:  Procedure Laterality Date  . A-FLUTTER ABLATION N/A 09/24/2016   Procedure: A-Flutter Ablation;  Surgeon: Will Meredith Leeds, MD;  Location: [place] [facility];  Service: Cardiovascular;  Laterality: N/A;  . ABDOMINAL AORTOGRAM W/LOWER EXTREMITY N/A 04/13/2017   Procedure: ABDOMINAL AORTOGRAM W/LOWER EXTREMITY;  Surgeon: Conrad Sierra Blanca, MD;  Location: [place] [facility];  Service: Cardiovascular;  Laterality: N/A;  Bilater lower extermity  . APPENDECTOMY    . AV FISTULA PLACEMENT Right 09/18/2014   Procedure:  INSERTION OF ARTERIOVENOUS (AV) GORE-TEX GRAFT ARM USING 4-7MM  X 45CM STRETCH GORE-TEX VASCULAR GRAFT;  Surgeon: Rosetta Posner, MD;  Location: [place];  Service: Vascular;  Laterality: Right;  . AV FISTULA PLACEMENT Left 07/07/2016   Procedure: INSERTION OF LEFT BRACHIAL TO AXILLARY ARTERIOVENOUS (AV) GORE-TEX ARM GRAFT;  Surgeon: Conrad Franklin, MD;  Location: [place];  Service: Vascular;  Laterality: Left;  . CARDIAC CATHETERIZATION  ~ 2016  . COLONOSCOPY    . 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 EXCHANGE OF A DIALYSIS CATHETER Left 01/11/2018   Procedure: EXCHANGE OF TUNNELED DIALYSIS CATHETER;  Surgeon: Rosetta Posner, MD;  Location: [place];  Service: Vascular;  Laterality: Left;  . HERNIA REPAIR  2017   with nephrectomy  . INSERTION OF DIALYSIS CATHETER N/A 10/08/2017   Procedure: INSERTION OF TUNNELED DIALYSIS CATHETER;  Surgeon: Conrad , MD;  Location: [place];  Service: Vascular;  Laterality: N/A;  . IR GENERIC HISTORICAL  05/11/2016   IR FLUORO GUIDE CV LINE LEFT 05/11/2016 Corrie Mckusick, DO [facility]  . IR GENERIC HISTORICAL  05/11/2016   IR US GUIDE VASC ACCESS LEFT 05/11/2016 Corrie Mckusick, DO [facility]  . IR GENERIC HISTORICAL  05/11/2016   IR US GUIDE VASC ACCESS RIGHT 05/11/2016 Corrie Mckusick, DO [facility]  . IR  GENERIC HISTORICAL  05/11/2016   IR RADIOLOGY PERIPHERAL GUIDED IV START 05/11/2016 Corrie Mckusick, DO [facility]  . KIDNEY TRANSPLANT  2009  . LEFT HEART CATH AND CORS/GRAFTS ANGIOGRAPHY N/A 12/04/2017   Procedure: LEFT HEART CATH AND CORS/GRAFTS ANGIOGRAPHY;  Surgeon: Belva Crome, MD;  Location: [place] [facility];  Service: Cardiovascular;  Laterality: N/A;  . NEPHRECTOMY  2017   transplant rejected   . PERIPHERAL VASCULAR CATHETERIZATION N/A 06/04/2016   Procedure: Upper Extremity Venography;  Surgeon: Waynetta Sandy, MD;  Location: [place] [facility];  Service:  Cardiovascular;  Laterality: N/A;  . PERIPHERAL VASCULAR INTERVENTION  04/13/2017   Procedure: PERIPHERAL VASCULAR INTERVENTION;  Surgeon: Conrad Harveysburg, MD;  Location: [place] [facility];  Service: Cardiovascular;;  Lt. Common/Exernal  Iliac  . THROMBECTOMY AND REVISION OF ARTERIOVENTOUS (AV) GORETEX  GRAFT Left 10/08/2017   Procedure: THROMBECTOMY of ARTERIOVENTOUS (AV) GORETEX  GRAFT LEFT UPPER ARM;  Surgeon: Conrad Ashford, MD;  Location: [place];  Service: Vascular;  Laterality: Left;  . THROMBECTOMY W/ EMBOLECTOMY Left 09/14/2017   Procedure: THROMBECTOMY ARTERIOVENOUS GORE-TEX GRAFT LEFT UPPER ARM;  Surgeon: Rosetta Posner, MD;  Location: [place];  Service: Vascular;  Laterality: Left;  . UPPER EXTREMITY VENOGRAPHY N/A 11/16/2017   Procedure: UPPER EXTREMITY VENOGRAPHY - Right Arm;  Surgeon: Conrad Sweetser, MD;  Location: [place] [facility];  Service: Cardiovascular;  Laterality: N/A;    Social History   Socioeconomic History  . Marital status: Married    Spouse name: Not on file  . Number of children: Not on file  . Years of education: Not on file  . Highest education level: Not on file  Occupational History  . Not on file  Social Needs  . Financial resource strain: Not on file  . Food insecurity:    Worry: Not on file    Inability: Not on file  . Transportation needs:    Medical: Not on file    Non-medical: Not on file  Tobacco Use  . Smoking status: Former Smoker    Types: Cigarettes    Last attempt to quit: 07/04/2014    Years since quitting: 3.6  . Smokeless tobacco: Never Used  . Tobacco comment: "smoked ~ 1 pack/month when I did smoke; never a steady smoker"  Substance and Sexual Activity  . Alcohol use: Yes    Alcohol/week: 0.0 oz    Comment: rare  "2 drinks, 1-2 times/year"  . Drug use: No  . Sexual activity: Not Currently    Birth control/protection: None  Lifestyle  . Physical activity:    Days per week: Not on file    Minutes per session: Not on file  . Stress:  Not on file  Relationships  . Social connections:    Talks on phone: Not on file    Gets together: Not on file    Attends religious service: Not on file    Active member of club or organization: Not on file    Attends meetings of clubs or organizations: Not on file    Relationship status: Not on file  . Intimate partner violence:    Fear of current or ex partner: Not on file    Emotionally abused: Not on file    Physically abused: Not on file    Forced sexual activity: Not on file  Other Topics Concern  . Not on file  Social History Narrative  . Not on file    Family History  Problem Relation Age of Onset  . Hyperlipidemia Mother   . Hypertension Mother     Current Outpatient Medications  Medication Sig Dispense Refill  . acetaminophen (TYLENOL) 500 MG tablet Take 1,000 mg by mouth every 6 (six) hours as needed for moderate pain or headache.    . albuterol (PROVENTIL HFA;VENTOLIN HFA) 108 (90 Base) MCG/ACT inhaler Inhale 1-2 puffs into the lungs every 6 (six) hours as needed for wheezing or shortness of breath. 1 Inhaler 0  . amLODipine (NORVASC) 5 MG tablet Take 1 tablet (5 mg total) by mouth daily. 90 tablet 3  . aspirin EC 81 MG tablet Take 81 mg by mouth daily.    . calcium acetate (PHOSLO) 667 MG capsule Take 1,334 mg by mouth daily with supper.     Marland Kitchen CALCIUM CARBONATE-VITAMIN D PO Take 1 tablet by mouth once a week.     . clopidogrel (PLAVIX) 75 MG tablet Take 1 tablet (75 mg total) by mouth daily. 90 tablet 3  . diphenhydrAMINE (BENADRYL) 50 MG tablet Take 50 mg by mouth daily as needed (prep for iv dye).     . isosorbide mononitrate (IMDUR) 60 MG 24 hr tablet Take 60 mg by mouth daily.    . predniSONE (DELTASONE) 50 MG tablet Take 12 hours prior to cath, 6 hours prior and 1 hour prior 3 tablet 0  . repaglinide (PRANDIN) 0.5 MG tablet Take 0.5 mg by mouth 2 (two) times a week.    . rosuvastatin (CRESTOR) 10 MG tablet Take 1 tablet (10 mg total) by mouth daily. 90 tablet  3  . traMADol (ULTRAM) 50 MG tablet Take 1 tablet (50 mg total) by mouth every 6 (six) hours as needed for moderate pain. 8 tablet 0   No current facility-administered medications for this visit.      Allergies  Allergen Reactions  . Lipitor [Atorvastatin] Other (See Comments)    Leg pain  . Metoprolol Other (See Comments)    Headaches   . Iodinated Diagnostic Agents Swelling, Rash and Other (See Comments)    Other Reaction: burning to mouth, swelling of lips.    REVIEW OF SYSTEMS (negative unless checked):   Cardiac:  []  Chest pain or chest pressure? []  Shortness of breath upon activity? []  Shortness of breath when lying flat? []  Irregular heart rhythm?  Vascular:  [x]  Pain in calf, thigh, or hip brought on by walking? []  Pain in feet at night that wakes you up from your sleep? []  Blood clot in your veins? []  Leg swelling?  Pulmonary:  []  Oxygen at home? []  Productive cough? []  Wheezing?  Neurologic:  []  Sudden weakness in arms or legs? []  Sudden numbness in arms or legs? []  Sudden onset of difficult speaking or slurred speech? []  Temporary loss of vision in one eye? []  Problems with dizziness?  Gastrointestinal:  []  Blood in stool? []  Vomited blood?  Genitourinary:  []  Burning when urinating? []  Blood in urine?  Psychiatric:  []  Major depression  Hematologic:  []  Bleeding problems? []  Problems with blood clotting?  Dermatologic:  []  Rashes or ulcers?  Constitutional:  []  Fever or chills?  Ear/Nose/Throat:  []  Change in hearing? []  Nose bleeds? []  Sore throat?  Musculoskeletal:  []  Back pain? []  Joint pain? []  Muscle pain?   Physical Examination   Vitals:   02/10/18 0946  BP: (!) 143/85  Pulse: 73  Resp: 20  Temp: (!) 97 F (36.1 C)  TempSrc: Oral  SpO2: 100%  Weight: 229  lb (103.9 kg)  Height: 5' 6.5" (1.689 m)   Body mass index is 36.41 kg/m.  General Alert, O x 3, WD, NAD  Pulmonary Sym exp, good B air movt, CTA B    Cardiac RRR, Nl S1, S2, no Murmurs, No rubs, No S3,S4  Vascular Vessel Right Left  Radial Palpable Palpable  Brachial Palpable Palpable  Ulnar Not palpable Not palpable    Musculo- skeletal M/S 5/5 throughout  , Extremities without ischemic changes    Neurologic Pain and light touch intact in extremities , Motor exam as listed above    Non-invasive Vascular Imaging   ABI (02/10/2018)  R:   ABI: [place],   PT: mono  DP: damp mono  TBI:  none  L:   ABI: [place],   PT: damp mono  DP: damp mono  TBI: none   Medical Decision Making   ROMAR WOODRICK is a 62 y.o. male who presents with ESRD requiring hemodialysis, known history of BLE PAD   Given PAD history, would attempt a LUA looped AVG with a HeRO graft-catheter, prior proceeding with a thigh AVG.  Given the diseased status of the L brachial, would NOT use an Accuseal, so a femoral tunneled dialysis catheter will be temporarily necessary for 4 weeks.  I had an extensive discussion with this patient in regards to the nature of access surgery, including risk, benefits, and alternatives.    The patient is aware that the risks of access surgery include but are not limited to: bleeding, infection, steal syndrome, nerve damage, ischemic monomelic neuropathy, failure of access to mature, and possible need for additional access procedures in the future.  The patient has not agreed to proceed with the above procedure.  The patient is still considering peritoneal dialysis.  If he elects such, I would not place the HeRO  Adele Barthel, MD, [facility] [place] Office: [phone] Pager: [phone]

## 2018-02-10 ENCOUNTER — Ambulatory Visit (HOSPITAL_COMMUNITY)
Admission: RE | Admit: 2018-02-10 | Discharge: 2018-02-10 | Disposition: A | Discharge status: Home / Self Care | Payer: Medicare Other | Source: Ambulatory Visit | Attending: Vascular Surgery | Admitting: Vascular Surgery

## 2018-02-10 ENCOUNTER — Ambulatory Visit (INDEPENDENT_AMBULATORY_CARE_PROVIDER_SITE_OTHER): Payer: Medicare Other | Admitting: Vascular Surgery

## 2018-02-10 ENCOUNTER — Other Ambulatory Visit: Payer: Self-pay

## 2018-02-10 ENCOUNTER — Encounter: Payer: Self-pay | Admitting: Vascular Surgery

## 2018-02-10 VITALS — BP 143/85 | HR 73 | Temp 97.0°F | Resp 20 | Ht 66.5 in | Wt 229.0 lb

## 2018-02-10 DIAGNOSIS — I70213 Atherosclerosis of native arteries of extremities with intermittent claudication, bilateral legs: Secondary | ICD-10-CM | POA: Insufficient documentation

## 2018-02-10 DIAGNOSIS — N186 End stage renal disease: Secondary | ICD-10-CM

## 2018-02-11 DIAGNOSIS — D689 Coagulation defect, unspecified: Secondary | ICD-10-CM | POA: Diagnosis not present

## 2018-02-11 DIAGNOSIS — N186 End stage renal disease: Secondary | ICD-10-CM | POA: Diagnosis not present

## 2018-02-11 DIAGNOSIS — N2581 Secondary hyperparathyroidism of renal origin: Secondary | ICD-10-CM | POA: Diagnosis not present

## 2018-02-11 DIAGNOSIS — Z992 Dependence on renal dialysis: Secondary | ICD-10-CM | POA: Diagnosis not present

## 2018-02-11 DIAGNOSIS — E1029 Type 1 diabetes mellitus with other diabetic kidney complication: Secondary | ICD-10-CM | POA: Diagnosis not present

## 2018-02-11 DIAGNOSIS — D509 Iron deficiency anemia, unspecified: Secondary | ICD-10-CM | POA: Diagnosis not present

## 2018-02-13 DIAGNOSIS — D509 Iron deficiency anemia, unspecified: Secondary | ICD-10-CM | POA: Diagnosis not present

## 2018-02-13 DIAGNOSIS — N186 End stage renal disease: Secondary | ICD-10-CM | POA: Diagnosis not present

## 2018-02-13 DIAGNOSIS — D689 Coagulation defect, unspecified: Secondary | ICD-10-CM | POA: Diagnosis not present

## 2018-02-13 DIAGNOSIS — N2581 Secondary hyperparathyroidism of renal origin: Secondary | ICD-10-CM | POA: Diagnosis not present

## 2018-02-13 DIAGNOSIS — E1029 Type 1 diabetes mellitus with other diabetic kidney complication: Secondary | ICD-10-CM | POA: Diagnosis not present

## 2018-02-13 DIAGNOSIS — Z992 Dependence on renal dialysis: Secondary | ICD-10-CM | POA: Diagnosis not present

## 2018-02-16 DIAGNOSIS — N2581 Secondary hyperparathyroidism of renal origin: Secondary | ICD-10-CM | POA: Diagnosis not present

## 2018-02-16 DIAGNOSIS — D509 Iron deficiency anemia, unspecified: Secondary | ICD-10-CM | POA: Diagnosis not present

## 2018-02-16 DIAGNOSIS — E1029 Type 1 diabetes mellitus with other diabetic kidney complication: Secondary | ICD-10-CM | POA: Diagnosis not present

## 2018-02-16 DIAGNOSIS — N186 End stage renal disease: Secondary | ICD-10-CM | POA: Diagnosis not present

## 2018-02-16 DIAGNOSIS — Z992 Dependence on renal dialysis: Secondary | ICD-10-CM | POA: Diagnosis not present

## 2018-02-16 DIAGNOSIS — D689 Coagulation defect, unspecified: Secondary | ICD-10-CM | POA: Diagnosis not present

## 2018-02-17 ENCOUNTER — Ambulatory Visit: Payer: Medicare Other | Admitting: Vascular Surgery

## 2018-02-17 ENCOUNTER — Encounter (HOSPITAL_COMMUNITY): Payer: Medicare Other

## 2018-02-18 DIAGNOSIS — D509 Iron deficiency anemia, unspecified: Secondary | ICD-10-CM | POA: Diagnosis not present

## 2018-02-18 DIAGNOSIS — N186 End stage renal disease: Secondary | ICD-10-CM | POA: Diagnosis not present

## 2018-02-18 DIAGNOSIS — I4891 Unspecified atrial fibrillation: Secondary | ICD-10-CM | POA: Diagnosis not present

## 2018-02-18 DIAGNOSIS — N2581 Secondary hyperparathyroidism of renal origin: Secondary | ICD-10-CM | POA: Diagnosis not present

## 2018-02-18 DIAGNOSIS — Z992 Dependence on renal dialysis: Secondary | ICD-10-CM | POA: Diagnosis not present

## 2018-02-18 DIAGNOSIS — D689 Coagulation defect, unspecified: Secondary | ICD-10-CM | POA: Diagnosis not present

## 2018-02-18 DIAGNOSIS — E1029 Type 1 diabetes mellitus with other diabetic kidney complication: Secondary | ICD-10-CM | POA: Diagnosis not present

## 2018-02-20 DIAGNOSIS — N2581 Secondary hyperparathyroidism of renal origin: Secondary | ICD-10-CM | POA: Diagnosis not present

## 2018-02-20 DIAGNOSIS — D509 Iron deficiency anemia, unspecified: Secondary | ICD-10-CM | POA: Diagnosis not present

## 2018-02-20 DIAGNOSIS — N186 End stage renal disease: Secondary | ICD-10-CM | POA: Diagnosis not present

## 2018-02-20 DIAGNOSIS — E1029 Type 1 diabetes mellitus with other diabetic kidney complication: Secondary | ICD-10-CM | POA: Diagnosis not present

## 2018-02-20 DIAGNOSIS — Z992 Dependence on renal dialysis: Secondary | ICD-10-CM | POA: Diagnosis not present

## 2018-02-20 DIAGNOSIS — D689 Coagulation defect, unspecified: Secondary | ICD-10-CM | POA: Diagnosis not present

## 2018-02-23 DIAGNOSIS — N186 End stage renal disease: Secondary | ICD-10-CM | POA: Diagnosis not present

## 2018-02-23 DIAGNOSIS — Z992 Dependence on renal dialysis: Secondary | ICD-10-CM | POA: Diagnosis not present

## 2018-02-23 DIAGNOSIS — E1029 Type 1 diabetes mellitus with other diabetic kidney complication: Secondary | ICD-10-CM | POA: Diagnosis not present

## 2018-02-23 DIAGNOSIS — N2581 Secondary hyperparathyroidism of renal origin: Secondary | ICD-10-CM | POA: Diagnosis not present

## 2018-02-23 DIAGNOSIS — D689 Coagulation defect, unspecified: Secondary | ICD-10-CM | POA: Diagnosis not present

## 2018-02-23 DIAGNOSIS — D509 Iron deficiency anemia, unspecified: Secondary | ICD-10-CM | POA: Diagnosis not present

## 2018-02-25 DIAGNOSIS — D631 Anemia in chronic kidney disease: Secondary | ICD-10-CM | POA: Diagnosis not present

## 2018-02-25 DIAGNOSIS — Z992 Dependence on renal dialysis: Secondary | ICD-10-CM | POA: Diagnosis not present

## 2018-02-25 DIAGNOSIS — N2581 Secondary hyperparathyroidism of renal origin: Secondary | ICD-10-CM | POA: Diagnosis not present

## 2018-02-25 DIAGNOSIS — D689 Coagulation defect, unspecified: Secondary | ICD-10-CM | POA: Diagnosis not present

## 2018-02-25 DIAGNOSIS — E1029 Type 1 diabetes mellitus with other diabetic kidney complication: Secondary | ICD-10-CM | POA: Diagnosis not present

## 2018-02-25 DIAGNOSIS — N186 End stage renal disease: Secondary | ICD-10-CM | POA: Diagnosis not present

## 2018-02-25 DIAGNOSIS — T8610 Unspecified complication of kidney transplant: Secondary | ICD-10-CM | POA: Diagnosis not present

## 2018-02-27 DIAGNOSIS — Z992 Dependence on renal dialysis: Secondary | ICD-10-CM | POA: Diagnosis not present

## 2018-02-27 DIAGNOSIS — D631 Anemia in chronic kidney disease: Secondary | ICD-10-CM | POA: Diagnosis not present

## 2018-02-27 DIAGNOSIS — N2581 Secondary hyperparathyroidism of renal origin: Secondary | ICD-10-CM | POA: Diagnosis not present

## 2018-02-27 DIAGNOSIS — D689 Coagulation defect, unspecified: Secondary | ICD-10-CM | POA: Diagnosis not present

## 2018-02-27 DIAGNOSIS — N186 End stage renal disease: Secondary | ICD-10-CM | POA: Diagnosis not present

## 2018-02-27 DIAGNOSIS — E1029 Type 1 diabetes mellitus with other diabetic kidney complication: Secondary | ICD-10-CM | POA: Diagnosis not present

## 2018-03-02 DIAGNOSIS — N186 End stage renal disease: Secondary | ICD-10-CM | POA: Diagnosis not present

## 2018-03-02 DIAGNOSIS — N2581 Secondary hyperparathyroidism of renal origin: Secondary | ICD-10-CM | POA: Diagnosis not present

## 2018-03-02 DIAGNOSIS — D689 Coagulation defect, unspecified: Secondary | ICD-10-CM | POA: Diagnosis not present

## 2018-03-02 DIAGNOSIS — Z992 Dependence on renal dialysis: Secondary | ICD-10-CM | POA: Diagnosis not present

## 2018-03-02 DIAGNOSIS — D631 Anemia in chronic kidney disease: Secondary | ICD-10-CM | POA: Diagnosis not present

## 2018-03-02 DIAGNOSIS — E1029 Type 1 diabetes mellitus with other diabetic kidney complication: Secondary | ICD-10-CM | POA: Diagnosis not present

## 2018-03-04 DIAGNOSIS — E1029 Type 1 diabetes mellitus with other diabetic kidney complication: Secondary | ICD-10-CM | POA: Diagnosis not present

## 2018-03-04 DIAGNOSIS — N2581 Secondary hyperparathyroidism of renal origin: Secondary | ICD-10-CM | POA: Diagnosis not present

## 2018-03-04 DIAGNOSIS — D631 Anemia in chronic kidney disease: Secondary | ICD-10-CM | POA: Diagnosis not present

## 2018-03-04 DIAGNOSIS — D689 Coagulation defect, unspecified: Secondary | ICD-10-CM | POA: Diagnosis not present

## 2018-03-04 DIAGNOSIS — N186 End stage renal disease: Secondary | ICD-10-CM | POA: Diagnosis not present

## 2018-03-04 DIAGNOSIS — Z992 Dependence on renal dialysis: Secondary | ICD-10-CM | POA: Diagnosis not present

## 2018-03-06 DIAGNOSIS — N2581 Secondary hyperparathyroidism of renal origin: Secondary | ICD-10-CM | POA: Diagnosis not present

## 2018-03-06 DIAGNOSIS — N186 End stage renal disease: Secondary | ICD-10-CM | POA: Diagnosis not present

## 2018-03-06 DIAGNOSIS — D689 Coagulation defect, unspecified: Secondary | ICD-10-CM | POA: Diagnosis not present

## 2018-03-06 DIAGNOSIS — Z992 Dependence on renal dialysis: Secondary | ICD-10-CM | POA: Diagnosis not present

## 2018-03-06 DIAGNOSIS — E1029 Type 1 diabetes mellitus with other diabetic kidney complication: Secondary | ICD-10-CM | POA: Diagnosis not present

## 2018-03-06 DIAGNOSIS — D631 Anemia in chronic kidney disease: Secondary | ICD-10-CM | POA: Diagnosis not present

## 2018-03-09 DIAGNOSIS — N186 End stage renal disease: Secondary | ICD-10-CM | POA: Diagnosis not present

## 2018-03-09 DIAGNOSIS — E1029 Type 1 diabetes mellitus with other diabetic kidney complication: Secondary | ICD-10-CM | POA: Diagnosis not present

## 2018-03-09 DIAGNOSIS — Z992 Dependence on renal dialysis: Secondary | ICD-10-CM | POA: Diagnosis not present

## 2018-03-09 DIAGNOSIS — N2581 Secondary hyperparathyroidism of renal origin: Secondary | ICD-10-CM | POA: Diagnosis not present

## 2018-03-09 DIAGNOSIS — D689 Coagulation defect, unspecified: Secondary | ICD-10-CM | POA: Diagnosis not present

## 2018-03-09 DIAGNOSIS — D631 Anemia in chronic kidney disease: Secondary | ICD-10-CM | POA: Diagnosis not present

## 2018-03-11 ENCOUNTER — Other Ambulatory Visit (HOSPITAL_COMMUNITY): Payer: Self-pay | Admitting: Nephrology

## 2018-03-11 DIAGNOSIS — E1029 Type 1 diabetes mellitus with other diabetic kidney complication: Secondary | ICD-10-CM | POA: Diagnosis not present

## 2018-03-11 DIAGNOSIS — D689 Coagulation defect, unspecified: Secondary | ICD-10-CM | POA: Diagnosis not present

## 2018-03-11 DIAGNOSIS — N186 End stage renal disease: Secondary | ICD-10-CM | POA: Diagnosis not present

## 2018-03-11 DIAGNOSIS — D631 Anemia in chronic kidney disease: Secondary | ICD-10-CM | POA: Diagnosis not present

## 2018-03-11 DIAGNOSIS — Z992 Dependence on renal dialysis: Secondary | ICD-10-CM | POA: Diagnosis not present

## 2018-03-11 DIAGNOSIS — N2581 Secondary hyperparathyroidism of renal origin: Secondary | ICD-10-CM | POA: Diagnosis not present

## 2018-03-12 ENCOUNTER — Ambulatory Visit (HOSPITAL_COMMUNITY)
Admission: RE | Admit: 2018-03-12 | Discharge: 2018-03-12 | Disposition: A | Discharge status: Home / Self Care | Payer: Medicare Other | Source: Ambulatory Visit | Attending: Nephrology | Admitting: Nephrology

## 2018-03-12 ENCOUNTER — Encounter (HOSPITAL_COMMUNITY): Payer: Self-pay | Admitting: Diagnostic Radiology

## 2018-03-12 DIAGNOSIS — Y712 Prosthetic and other implants, materials and accessory cardiovascular devices associated with adverse incidents: Secondary | ICD-10-CM | POA: Insufficient documentation

## 2018-03-12 DIAGNOSIS — N186 End stage renal disease: Secondary | ICD-10-CM

## 2018-03-12 DIAGNOSIS — T8241XA Breakdown (mechanical) of vascular dialysis catheter, initial encounter: Secondary | ICD-10-CM | POA: Diagnosis not present

## 2018-03-12 DIAGNOSIS — T8249XA Other complication of vascular dialysis catheter, initial encounter: Secondary | ICD-10-CM | POA: Diagnosis not present

## 2018-03-12 HISTORY — PX: IR FLUORO GUIDE CV LINE LEFT: IMG2282

## 2018-03-12 MED ORDER — CEFAZOLIN SODIUM-DEXTROSE 2-4 GM/100ML-% IV SOLN
INTRAVENOUS | Status: AC
  Filled 2018-03-12: qty 100

## 2018-03-12 MED ORDER — CHLORHEXIDINE GLUCONATE 4 % EX LIQD
CUTANEOUS | Status: AC
  Filled 2018-03-12: qty 15

## 2018-03-12 MED ORDER — LIDOCAINE HCL 1 % IJ SOLN
INTRAMUSCULAR | Status: AC | PRN
  Administered 2018-03-12: 10 mL

## 2018-03-12 MED ORDER — HEPARIN SODIUM (PORCINE) 1000 UNIT/ML IJ SOLN
INTRAMUSCULAR | Status: AC
  Filled 2018-03-12: qty 1

## 2018-03-12 MED ORDER — LIDOCAINE HCL 1 % IJ SOLN
INTRAMUSCULAR | Status: AC
  Filled 2018-03-12: qty 20

## 2018-03-12 MED ORDER — CEFAZOLIN SODIUM-DEXTROSE 2-4 GM/100ML-% IV SOLN
2.0000 g | Freq: Once | INTRAVENOUS | Status: AC
  Administered 2018-03-12: 2 g via INTRAVENOUS

## 2018-03-12 NOTE — Procedures (Addendum)
  Pre-operative Diagnosis: ESRD on dialysis       Post-operative Diagnosis: ESRD on dialysis   Indications: Poorly functioning dialysis catheter   Procedure: Exchange of HD catheter with fluoroscopy  Findings: New 28 cm tip to cuff catheter in right atrium.  Both lumens aspirating and flushing well.  Complications: None     EBL: Minimal  Plan: Catheter is ready to use.

## 2018-03-13 DIAGNOSIS — N2581 Secondary hyperparathyroidism of renal origin: Secondary | ICD-10-CM | POA: Diagnosis not present

## 2018-03-13 DIAGNOSIS — E1029 Type 1 diabetes mellitus with other diabetic kidney complication: Secondary | ICD-10-CM | POA: Diagnosis not present

## 2018-03-13 DIAGNOSIS — Z992 Dependence on renal dialysis: Secondary | ICD-10-CM | POA: Diagnosis not present

## 2018-03-13 DIAGNOSIS — N186 End stage renal disease: Secondary | ICD-10-CM | POA: Diagnosis not present

## 2018-03-13 DIAGNOSIS — D689 Coagulation defect, unspecified: Secondary | ICD-10-CM | POA: Diagnosis not present

## 2018-03-13 DIAGNOSIS — D631 Anemia in chronic kidney disease: Secondary | ICD-10-CM | POA: Diagnosis not present

## 2018-03-16 DIAGNOSIS — N2581 Secondary hyperparathyroidism of renal origin: Secondary | ICD-10-CM | POA: Diagnosis not present

## 2018-03-16 DIAGNOSIS — D631 Anemia in chronic kidney disease: Secondary | ICD-10-CM | POA: Diagnosis not present

## 2018-03-16 DIAGNOSIS — N186 End stage renal disease: Secondary | ICD-10-CM | POA: Diagnosis not present

## 2018-03-16 DIAGNOSIS — D689 Coagulation defect, unspecified: Secondary | ICD-10-CM | POA: Diagnosis not present

## 2018-03-16 DIAGNOSIS — E1029 Type 1 diabetes mellitus with other diabetic kidney complication: Secondary | ICD-10-CM | POA: Diagnosis not present

## 2018-03-16 DIAGNOSIS — Z992 Dependence on renal dialysis: Secondary | ICD-10-CM | POA: Diagnosis not present

## 2018-03-18 DIAGNOSIS — D631 Anemia in chronic kidney disease: Secondary | ICD-10-CM | POA: Diagnosis not present

## 2018-03-18 DIAGNOSIS — N2581 Secondary hyperparathyroidism of renal origin: Secondary | ICD-10-CM | POA: Diagnosis not present

## 2018-03-18 DIAGNOSIS — D689 Coagulation defect, unspecified: Secondary | ICD-10-CM | POA: Diagnosis not present

## 2018-03-18 DIAGNOSIS — E1029 Type 1 diabetes mellitus with other diabetic kidney complication: Secondary | ICD-10-CM | POA: Diagnosis not present

## 2018-03-18 DIAGNOSIS — Z992 Dependence on renal dialysis: Secondary | ICD-10-CM | POA: Diagnosis not present

## 2018-03-18 DIAGNOSIS — N186 End stage renal disease: Secondary | ICD-10-CM | POA: Diagnosis not present

## 2018-03-20 DIAGNOSIS — E1029 Type 1 diabetes mellitus with other diabetic kidney complication: Secondary | ICD-10-CM | POA: Diagnosis not present

## 2018-03-20 DIAGNOSIS — N186 End stage renal disease: Secondary | ICD-10-CM | POA: Diagnosis not present

## 2018-03-20 DIAGNOSIS — N2581 Secondary hyperparathyroidism of renal origin: Secondary | ICD-10-CM | POA: Diagnosis not present

## 2018-03-20 DIAGNOSIS — D689 Coagulation defect, unspecified: Secondary | ICD-10-CM | POA: Diagnosis not present

## 2018-03-20 DIAGNOSIS — D631 Anemia in chronic kidney disease: Secondary | ICD-10-CM | POA: Diagnosis not present

## 2018-03-20 DIAGNOSIS — Z992 Dependence on renal dialysis: Secondary | ICD-10-CM | POA: Diagnosis not present

## 2018-03-23 DIAGNOSIS — N186 End stage renal disease: Secondary | ICD-10-CM | POA: Diagnosis not present

## 2018-03-23 DIAGNOSIS — D689 Coagulation defect, unspecified: Secondary | ICD-10-CM | POA: Diagnosis not present

## 2018-03-23 DIAGNOSIS — D631 Anemia in chronic kidney disease: Secondary | ICD-10-CM | POA: Diagnosis not present

## 2018-03-23 DIAGNOSIS — N2581 Secondary hyperparathyroidism of renal origin: Secondary | ICD-10-CM | POA: Diagnosis not present

## 2018-03-23 DIAGNOSIS — Z992 Dependence on renal dialysis: Secondary | ICD-10-CM | POA: Diagnosis not present

## 2018-03-23 DIAGNOSIS — E1029 Type 1 diabetes mellitus with other diabetic kidney complication: Secondary | ICD-10-CM | POA: Diagnosis not present

## 2018-03-25 DIAGNOSIS — D631 Anemia in chronic kidney disease: Secondary | ICD-10-CM | POA: Diagnosis not present

## 2018-03-25 DIAGNOSIS — D689 Coagulation defect, unspecified: Secondary | ICD-10-CM | POA: Diagnosis not present

## 2018-03-25 DIAGNOSIS — N2581 Secondary hyperparathyroidism of renal origin: Secondary | ICD-10-CM | POA: Diagnosis not present

## 2018-03-25 DIAGNOSIS — N186 End stage renal disease: Secondary | ICD-10-CM | POA: Diagnosis not present

## 2018-03-25 DIAGNOSIS — E1029 Type 1 diabetes mellitus with other diabetic kidney complication: Secondary | ICD-10-CM | POA: Diagnosis not present

## 2018-03-25 DIAGNOSIS — I4891 Unspecified atrial fibrillation: Secondary | ICD-10-CM | POA: Diagnosis not present

## 2018-03-25 DIAGNOSIS — Z992 Dependence on renal dialysis: Secondary | ICD-10-CM | POA: Diagnosis not present

## 2018-03-27 DIAGNOSIS — D689 Coagulation defect, unspecified: Secondary | ICD-10-CM | POA: Diagnosis not present

## 2018-03-27 DIAGNOSIS — Z992 Dependence on renal dialysis: Secondary | ICD-10-CM | POA: Diagnosis not present

## 2018-03-27 DIAGNOSIS — N186 End stage renal disease: Secondary | ICD-10-CM | POA: Diagnosis not present

## 2018-03-27 DIAGNOSIS — N2581 Secondary hyperparathyroidism of renal origin: Secondary | ICD-10-CM | POA: Diagnosis not present

## 2018-03-27 DIAGNOSIS — D631 Anemia in chronic kidney disease: Secondary | ICD-10-CM | POA: Diagnosis not present

## 2018-03-27 DIAGNOSIS — E1029 Type 1 diabetes mellitus with other diabetic kidney complication: Secondary | ICD-10-CM | POA: Diagnosis not present

## 2018-03-28 DIAGNOSIS — T8610 Unspecified complication of kidney transplant: Secondary | ICD-10-CM | POA: Diagnosis not present

## 2018-03-28 DIAGNOSIS — N186 End stage renal disease: Secondary | ICD-10-CM | POA: Diagnosis not present

## 2018-03-28 DIAGNOSIS — Z992 Dependence on renal dialysis: Secondary | ICD-10-CM | POA: Diagnosis not present

## 2018-03-30 ENCOUNTER — Other Ambulatory Visit: Payer: Medicare Other

## 2018-03-30 DIAGNOSIS — Z992 Dependence on renal dialysis: Secondary | ICD-10-CM | POA: Diagnosis not present

## 2018-03-30 DIAGNOSIS — E1029 Type 1 diabetes mellitus with other diabetic kidney complication: Secondary | ICD-10-CM | POA: Diagnosis not present

## 2018-03-30 DIAGNOSIS — D689 Coagulation defect, unspecified: Secondary | ICD-10-CM | POA: Diagnosis not present

## 2018-03-30 DIAGNOSIS — N186 End stage renal disease: Secondary | ICD-10-CM | POA: Diagnosis not present

## 2018-03-30 DIAGNOSIS — D631 Anemia in chronic kidney disease: Secondary | ICD-10-CM | POA: Diagnosis not present

## 2018-03-30 DIAGNOSIS — Z23 Encounter for immunization: Secondary | ICD-10-CM | POA: Diagnosis not present

## 2018-03-30 DIAGNOSIS — N2581 Secondary hyperparathyroidism of renal origin: Secondary | ICD-10-CM | POA: Diagnosis not present

## 2018-04-01 DIAGNOSIS — E1029 Type 1 diabetes mellitus with other diabetic kidney complication: Secondary | ICD-10-CM | POA: Diagnosis not present

## 2018-04-01 DIAGNOSIS — N186 End stage renal disease: Secondary | ICD-10-CM | POA: Diagnosis not present

## 2018-04-01 DIAGNOSIS — Z992 Dependence on renal dialysis: Secondary | ICD-10-CM | POA: Diagnosis not present

## 2018-04-01 DIAGNOSIS — Z23 Encounter for immunization: Secondary | ICD-10-CM | POA: Diagnosis not present

## 2018-04-01 DIAGNOSIS — N2581 Secondary hyperparathyroidism of renal origin: Secondary | ICD-10-CM | POA: Diagnosis not present

## 2018-04-01 DIAGNOSIS — D689 Coagulation defect, unspecified: Secondary | ICD-10-CM | POA: Diagnosis not present

## 2018-04-02 ENCOUNTER — Other Ambulatory Visit: Payer: Medicare Other | Admitting: *Deleted

## 2018-04-02 ENCOUNTER — Telehealth: Payer: Self-pay | Admitting: *Deleted

## 2018-04-02 DIAGNOSIS — E785 Hyperlipidemia, unspecified: Secondary | ICD-10-CM

## 2018-04-02 DIAGNOSIS — N186 End stage renal disease: Secondary | ICD-10-CM | POA: Diagnosis not present

## 2018-04-02 DIAGNOSIS — I5032 Chronic diastolic (congestive) heart failure: Secondary | ICD-10-CM

## 2018-04-02 DIAGNOSIS — Z940 Kidney transplant status: Secondary | ICD-10-CM | POA: Diagnosis not present

## 2018-04-02 DIAGNOSIS — T8619 Other complication of kidney transplant: Secondary | ICD-10-CM

## 2018-04-02 LAB — LIPID PANEL
CHOLESTEROL TOTAL: 185 mg/dL (ref 100–199)
Chol/HDL Ratio: 4.6 ratio (ref 0.0–5.0)
HDL: 40 mg/dL (ref >39)
LDL CALC: 101 mg/dL — AB (ref 0–99)
TRIGLYCERIDES: 218 mg/dL — AB (ref 0–149)
VLDL CHOLESTEROL CAL: 44 mg/dL — AB (ref 5–40)

## 2018-04-02 LAB — HEPATIC FUNCTION PANEL
ALK PHOS: 109 IU/L (ref 39–117)
ALT: 6 IU/L (ref 0–44)
AST: 20 IU/L (ref 0–40)
Albumin: 4.5 g/dL (ref 3.6–4.8)
BILIRUBIN TOTAL: 0.3 mg/dL (ref 0.0–1.2)
Bilirubin, Direct: 0.1 mg/dL (ref 0.00–0.40)
Total Protein: 7.1 g/dL (ref 6.0–8.5)

## 2018-04-02 MED ORDER — EZETIMIBE 10 MG PO TABS: 10.0000 mg | ORAL_TABLET | Freq: Every day | ORAL | 3 refills | Status: DC

## 2018-04-02 NOTE — Telephone Encounter (Signed)
-----   Message from Liliane Shi, [place] sent at 04/02/2018  5:00 PM EDT ----- The following abnormalities are noted:    - Triglycerides, LDL above goal.  Goal LDL < 70 All other values are normal, stable or within acceptable limits. Medication changes / Follow up labs / Other changes or recommendations:    - Continue current dose of Rosuvastatin (unable to increase > 10 mg QD in patients with ESRD)  - Start Ezetimibe 10 mg QD  - Lipids and LFTs in 3 mos. Richardson Dopp, PA-C 04/02/2018 4:58 PM

## 2018-04-02 NOTE — Telephone Encounter (Signed)
Pt has been notified of lab results and recommendations. Pt agreeable to start Zetia 10 mg daily, Rx has been sent in. FLP/LFT 07/02/18.   Pt states to me that he is trying to get on kidney transplant list and asked for a sooner appt. Pt has recall for Dr. [name] 05/2018, though no appts available. Pt has been scheduled with Richardson Dopp, PA , Dr. Jamal Maes APP 04/06/18 @ 11:15 same day D. [name] is in the office. Pt thanked me for the call and my help. I will removed the recall from epic as pt will be seen 04/06/18.

## 2018-04-03 DIAGNOSIS — E1029 Type 1 diabetes mellitus with other diabetic kidney complication: Secondary | ICD-10-CM | POA: Diagnosis not present

## 2018-04-03 DIAGNOSIS — Z23 Encounter for immunization: Secondary | ICD-10-CM | POA: Diagnosis not present

## 2018-04-03 DIAGNOSIS — D689 Coagulation defect, unspecified: Secondary | ICD-10-CM | POA: Diagnosis not present

## 2018-04-03 DIAGNOSIS — N186 End stage renal disease: Secondary | ICD-10-CM | POA: Diagnosis not present

## 2018-04-03 DIAGNOSIS — Z992 Dependence on renal dialysis: Secondary | ICD-10-CM | POA: Diagnosis not present

## 2018-04-03 DIAGNOSIS — N2581 Secondary hyperparathyroidism of renal origin: Secondary | ICD-10-CM | POA: Diagnosis not present

## 2018-04-05 ENCOUNTER — Ambulatory Visit (INDEPENDENT_AMBULATORY_CARE_PROVIDER_SITE_OTHER): Payer: Medicare Other | Admitting: Endocrinology

## 2018-04-05 ENCOUNTER — Encounter: Payer: Self-pay | Admitting: Endocrinology

## 2018-04-05 VITALS — BP 136/80 | HR 80 | Ht 66.5 in | Wt 235.6 lb

## 2018-04-05 DIAGNOSIS — I70213 Atherosclerosis of native arteries of extremities with intermittent claudication, bilateral legs: Secondary | ICD-10-CM

## 2018-04-05 DIAGNOSIS — N184 Chronic kidney disease, stage 4 (severe): Secondary | ICD-10-CM

## 2018-04-05 DIAGNOSIS — E1122 Type 2 diabetes mellitus with diabetic chronic kidney disease: Secondary | ICD-10-CM | POA: Diagnosis not present

## 2018-04-05 LAB — POCT GLYCOSYLATED HEMOGLOBIN (HGB A1C): Hemoglobin A1C: 6.2 % — AB (ref 4.0–5.6)

## 2018-04-05 MED ORDER — REPAGLINIDE 0.5 MG PO TABS: 0.5000 mg | ORAL_TABLET | Freq: Two times a day (BID) | ORAL | 3 refills | Status: DC

## 2018-04-05 NOTE — Patient Instructions (Signed)
Please continue the same medication check your blood sugar once a day.  vary the time of day when you check, between before the 3 meals, and at bedtime.  also check if you have symptoms of your blood sugar being too high or too low.  please keep a record of the readings and bring it to your next appointment here.  You can write it on any piece of paper.  please call us sooner if your blood sugar goes below 70, or if you have a lot of readings over 200.   Please come back for a follow-up appointment in 4-6 months.

## 2018-04-05 NOTE — Progress Notes (Signed)
Subjective:    Patient ID: Patrick [name], male    DOB: Mar 26, 1956, 62 y.o.   MRN: [id number]  HPI Pt returns for f/u of diabetes mellitus: DM type: 2 Dx'ed: 2014, when he presented with severe hyperglycemia.   Complications: polyneuropathy, CAD, PAD, and renal failure.  Therapy: repaglinide.  DKA: never. Severe hypoglycemia: never. Pancreatitis: never. Other: he took insulin 2014-2016; he had renal transplant in 2009, but renal failure has recurred.  He is back on dialysis.  Fructosamine has suggested a1c approx 1% higher than a1c itself.  Interval history: pt states he feels well in general. no cbg record, but states cbg's are well-controlled.  pt states he feels well in general. Past Medical History:  Diagnosis Date  . Arthritis    "back" (09/24/2016)  . CHF (congestive heart failure) ([place])   . Coronary artery disease   . Dysrhythmia    aflutter s/p ablation 2018  . ESRD (end stage renal disease) on dialysis [facility])    "TTS; [place]" (09/24/2016)  . Gout   . Hernia of abdominal wall   . History of blood transfusion 2009- 2016   "I've had several; low HgB"  . History of kidney stones    treated with nephrectomy  . Hypertension   . Impotence of organic origin   . Migraine    "stopped in my 30's; they were related to high BP" (09/24/2016)  . Myocardial infarction [facility]) '96  . Pneumonia 01/2015   " a touch & I was in the hosp."  . Secondary hyperparathyroidism ([place])   . Type 2 diabetes mellitus (HCC)    no longer on medication since going on dialysis  . Wears glasses     Past Surgical History:  Procedure Laterality Date  . A-FLUTTER ABLATION N/A 09/24/2016   Procedure: A-Flutter Ablation;  Surgeon: Will Meredith Leeds, MD;  Location: [place] CV [facility];  Service: Cardiovascular;  Laterality: N/A;  . ABDOMINAL AORTOGRAM W/LOWER EXTREMITY N/A 04/13/2017   Procedure: ABDOMINAL AORTOGRAM W/LOWER EXTREMITY;  Surgeon: Conrad Plainfield, MD;  Location: [place] CV [facility];   Service: Cardiovascular;  Laterality: N/A;  Bilater lower extermity  . APPENDECTOMY    . AV FISTULA PLACEMENT Right 09/18/2014   Procedure: INSERTION OF ARTERIOVENOUS (AV) GORE-TEX GRAFT ARM USING 4-7MM  X 45CM STRETCH GORE-TEX VASCULAR GRAFT;  Surgeon: Rosetta Posner, MD;  Location: [place];  Service: Vascular;  Laterality: Right;  . AV FISTULA PLACEMENT Left 07/07/2016   Procedure: INSERTION OF LEFT BRACHIAL TO AXILLARY ARTERIOVENOUS (AV) GORE-TEX ARM GRAFT;  Surgeon: Conrad Ridge Manor, MD;  Location: [place];  Service: Vascular;  Laterality: Left;  . CARDIAC CATHETERIZATION  ~ 2016  . COLONOSCOPY    . 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 EXCHANGE OF A DIALYSIS CATHETER Left 01/11/2018   Procedure: EXCHANGE OF TUNNELED DIALYSIS CATHETER;  Surgeon: Rosetta Posner, MD;  Location: [place];  Service: Vascular;  Laterality: Left;  . HERNIA REPAIR  2017   with nephrectomy  . INSERTION OF DIALYSIS CATHETER N/A 10/08/2017   Procedure: INSERTION OF TUNNELED DIALYSIS CATHETER;  Surgeon: Conrad Ho[place] Bay, MD;  Location: [place];  Service: Vascular;  Laterality: N/A;  . IR FLUORO GUIDE CV LINE LEFT  03/12/2018  . IR GENERIC HISTORICAL  05/11/2016   IR FLUORO GUIDE CV LINE LEFT 05/11/2016 Corrie Mckusick, DO [facility]-INTERV RAD  . IR GENERIC HISTORICAL  05/11/2016   IR  US GUIDE VASC ACCESS LEFT 05/11/2016 Corrie Mckusick, DO [facility]  . IR GENERIC HISTORICAL  05/11/2016   IR US GUIDE VASC ACCESS RIGHT 05/11/2016 Corrie Mckusick, DO [facility]INTER[facility]  . IR GENERIC HISTORICAL  05/11/2016   IR RADIOLOGY PERIPHERAL GUIDED IV START 05/11/2016 Corrie Mckusick, DO [facility]-INTER[facility]  . KIDNEY TRANSPLANT  2009  . LEFT HEART CATH AND CORS/GRAFTS ANGIOGRAPHY N/A 12/04/2017   Procedure: LEFT HEART CATH AND CORS/GRAFTS ANGIOGRAPHY;  Surgeon: Belva Crome, MD;  Location: [place] [facility];  Service: Cardiovascular;  Laterality: N/A;  . NEPHRECTOMY  2017   transplant  rejected   . PERIPHERAL VASCULAR CATHETERIZATION N/A 06/04/2016   Procedure: Upper Extremity Venography;  Surgeon: Waynetta Sandy, MD;  Location: [place] CV [facility];  Service: Cardiovascular;  Laterality: N/A;  . PERIPHERAL VASCULAR INTERVENTION  04/13/2017   Procedure: PERIPHERAL VASCULAR INTERVENTION;  Surgeon: Conrad Moca, MD;  Location: [place] [facility];  Service: Cardiovascular;;  Lt. Common/Exernal  Iliac  . THROMBECTOMY AND REVISION OF ARTERIOVENTOUS (AV) GORETEX  GRAFT Left 10/08/2017   Procedure: THROMBECTOMY of ARTERIOVENTOUS (AV) GORETEX  GRAFT LEFT UPPER ARM;  Surgeon: Conrad South Eliot, MD;  Location: [place];  Service: Vascular;  Laterality: Left;  . THROMBECTOMY W/ EMBOLECTOMY Left 09/14/2017   Procedure: THROMBECTOMY ARTERIOVENOUS GORE-TEX GRAFT LEFT UPPER ARM;  Surgeon: Rosetta Posner, MD;  Location: [place];  Service: Vascular;  Laterality: Left;  . UPPER EXTREMITY VENOGRAPHY N/A 11/16/2017   Procedure: UPPER EXTREMITY VENOGRAPHY - Right Arm;  Surgeon: Conrad [place], MD;  Location: [place] [facility];  Service: Cardiovascular;  Laterality: N/A;    Social History   Socioeconomic History  . Marital status: Married    Spouse name: Not on file  . Number of children: Not on file  . Years of education: Not on file  . Highest education level: Not on file  Occupational History  . Not on file  Social Needs  . Financial resource strain: Not on file  . Food insecurity:    Worry: Not on file    Inability: Not on file  . Transportation needs:    Medical: Not on file    Non-medical: Not on file  Tobacco Use  . Smoking status: Former Smoker    Types: Cigarettes    Last attempt to quit: 07/04/2014    Years since quitting: 3.7  . Smokeless tobacco: Never Used  . Tobacco comment: "smoked ~ 1 pack/month when I did smoke; never a steady smoker"  Substance and Sexual Activity  . Alcohol use: Yes    Alcohol/week: 0.0 standard drinks    Comment: rare  "2 drinks, 1-2 times/year"  .  Drug use: No  . Sexual activity: Not Currently    Birth control/protection: None  Lifestyle  . Physical activity:    Days per week: Not on file    Minutes per session: Not on file  . Stress: Not on file  Relationships  . Social connections:    Talks on phone: Not on file    Gets together: Not on file    Attends religious service: Not on file    Active member of club or organization: Not on file    Attends meetings of clubs or organizations: Not on file    Relationship status: Not on file  . Intimate partner violence:    Fear of current or ex partner: Not on file    Emotionally abused: Not on file    Physically  abused: Not on file    Forced sexual activity: Not on file  Other Topics Concern  . Not on file  Social History Narrative  . Not on file    Current Outpatient Medications on File Prior to Visit  Medication Sig Dispense Refill  . acetaminophen (TYLENOL) 500 MG tablet Take 1,000 mg by mouth every 6 (six) hours as needed for moderate pain or headache.    . albuterol (PROVENTIL HFA;VENTOLIN HFA) 108 (90 Base) MCG/ACT inhaler Inhale 1-2 puffs into the lungs every 6 (six) hours as needed for wheezing or shortness of breath. 1 Inhaler 0  . aspirin EC 81 MG tablet Take 81 mg by mouth [name].    . calcium acetate (PHOSLO) 667 MG capsule Take 1,334 mg by mouth [name] with supper.     Marland Kitchen CALCIUM CARBONATE-VITAMIN D PO Take 1 tablet by mouth once a week.     . clopidogrel (PLAVIX) 75 MG tablet Take 1 tablet (75 mg total) by mouth [name]. 90 tablet 3  . diphenhydrAMINE (BENADRYL) 50 MG tablet Take 50 mg by mouth [name] as needed (prep for iv dye).     Marland Kitchen ezetimibe (ZETIA) 10 MG tablet Take 1 tablet (10 mg total) by mouth [name]. 90 tablet 3  . isosorbide mononitrate (IMDUR) 60 MG 24 hr tablet Take 60 mg by mouth [name].    . predniSONE (DELTASONE) 50 MG tablet Take 12 hours prior to cath, 6 hours prior and 1 hour prior 3 tablet 0  . traMADol (ULTRAM) 50 MG tablet Take 1 tablet (50 mg total)  by mouth every 6 (six) hours as needed for moderate pain. 8 tablet 0  . amLODipine (NORVASC) 5 MG tablet Take 1 tablet (5 mg total) by mouth [name]. 90 tablet 3  . rosuvastatin (CRESTOR) 10 MG tablet Take 1 tablet (10 mg total) by mouth [name]. 90 tablet 3   No current facility-administered medications on file prior to visit.     Allergies  Allergen Reactions  . Lipitor [Atorvastatin] Other (See Comments)    Leg pain  . Metoprolol Other (See Comments)    Headaches   . Iodinated Diagnostic Agents Swelling, Rash and Other (See Comments)    Other Reaction: burning to mouth, swelling of lips.    Family History  Problem Relation Age of Onset  . Hyperlipidemia Mother   . Hypertension Mother     BP 136/80 (BP Location: Right Arm, Patient Position: Sitting)   Pulse 80   Ht 5' 6.5" (1.689 m)   Wt 235 lb 9.6 oz (106.9 kg)   SpO2 98%   BMI 37.46 kg/m    Review of Systems He denies hypoglycemia    Objective:   Physical Exam VITAL SIGNS:  See vs page GENERAL: no distress Pulses: foot pulses are intact bilaterally.   MSK: no deformity of the feet or ankles.  CV: no edema of the legs or ankles Skin:  no ulcer on the feet or ankles.  normal color and temp on the feet and ankles Neuro: sensation is intact to touch on the feet and ankles.   Ext: There is bilateral onychomycosis of the toenails   Lab Results  Component Value Date   HGBA1C 6.2 (A) 04/05/2018       Assessment & Plan:  Type 2 DM, with PAD: well-controlled ESRD: in this setting, a1c overestimates aggressiveness of glycemic control  Patient Instructions  Please continue the same medication check your blood sugar once a day.  vary the time of  day when you check, between before the 3 meals, and at bedtime.  also check if you have symptoms of your blood sugar being too high or too low.  please keep a record of the readings and bring it to your next appointment here.  You can write it on any piece of paper.  please call  us sooner if your blood sugar goes below 70, or if you have a lot of readings over 200.   Please come back for a follow-up appointment in 4-6 months.

## 2018-04-06 ENCOUNTER — Encounter: Payer: Self-pay | Admitting: Physician Assistant

## 2018-04-06 ENCOUNTER — Ambulatory Visit (INDEPENDENT_AMBULATORY_CARE_PROVIDER_SITE_OTHER): Payer: Medicare Other | Admitting: Physician Assistant

## 2018-04-06 VITALS — BP 130/70 | HR 81 | Ht 66.5 in | Wt 232.8 lb

## 2018-04-06 DIAGNOSIS — I70213 Atherosclerosis of native arteries of extremities with intermittent claudication, bilateral legs: Secondary | ICD-10-CM

## 2018-04-06 DIAGNOSIS — I25119 Atherosclerotic heart disease of native coronary artery with unspecified angina pectoris: Secondary | ICD-10-CM | POA: Diagnosis not present

## 2018-04-06 DIAGNOSIS — I5032 Chronic diastolic (congestive) heart failure: Secondary | ICD-10-CM | POA: Diagnosis not present

## 2018-04-06 DIAGNOSIS — I10 Essential (primary) hypertension: Secondary | ICD-10-CM | POA: Diagnosis not present

## 2018-04-06 DIAGNOSIS — E785 Hyperlipidemia, unspecified: Secondary | ICD-10-CM | POA: Diagnosis not present

## 2018-04-06 DIAGNOSIS — I483 Typical atrial flutter: Secondary | ICD-10-CM | POA: Diagnosis not present

## 2018-04-06 DIAGNOSIS — E1029 Type 1 diabetes mellitus with other diabetic kidney complication: Secondary | ICD-10-CM | POA: Diagnosis not present

## 2018-04-06 DIAGNOSIS — N186 End stage renal disease: Secondary | ICD-10-CM | POA: Diagnosis not present

## 2018-04-06 DIAGNOSIS — D689 Coagulation defect, unspecified: Secondary | ICD-10-CM | POA: Diagnosis not present

## 2018-04-06 DIAGNOSIS — Z23 Encounter for immunization: Secondary | ICD-10-CM | POA: Diagnosis not present

## 2018-04-06 DIAGNOSIS — Z992 Dependence on renal dialysis: Secondary | ICD-10-CM | POA: Diagnosis not present

## 2018-04-06 DIAGNOSIS — N2581 Secondary hyperparathyroidism of renal origin: Secondary | ICD-10-CM | POA: Diagnosis not present

## 2018-04-06 MED ORDER — ISOSORBIDE MONONITRATE ER 60 MG PO TB24: 90.0000 mg | ORAL_TABLET | Freq: Every day | ORAL | 3 refills | Status: DC

## 2018-04-06 NOTE — Patient Instructions (Signed)
Medication Instructions:  1. INCREASE IMDUR TO 90 MG DAILY (YOU WILL TAKE 1 AND 1/2 TABLETS DAILY OF THE 60 MG TABLET)  Labwork: NONE ORDERED TODAY  Testing/Procedures: NONE ORDERED TODAY  Follow-Up: 6 MONTHS WITH DR. ROSS OR SCOTT WEAVER, PAC   Any Other Special Instructions Will Be Listed Below (If Applicable).     If you need a refill on your cardiac medications before your next appointment, please call your pharmacy.

## 2018-04-06 NOTE — Progress Notes (Signed)
Cardiology Office Note:    Date:  04/06/2018   ID:  Patrick [name], DOB 1956-01-10, MRN [id number]  PCP:  Nolene Ebbs, MD  Cardiologist:  Dorris Carnes, MD   Electrophysiologist:  Constance Haw, MD   Referring MD: Nolene Ebbs, MD   Chief Complaint  Patient presents with  . Surgical Clearance    Patient wants to know if he can have kidney trasplant     History of Present Illness:    Patrick [name] is a 62 y.o. 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  In 2007, he developed reduced LV function with EF 30% and high output heart failure attributed to simultaneous left and right AV fistulas for dialysis. His right AV fistula was closed and he had resolution of symptoms and recovery of LV function.  Nuclear stress test in February 2019 demonstrated anteroseptal, apical anterior, apical septal, apical lateral, apical scar but no ischemia. Medical therapy was recommended.  He underwent Cardiac Catheterization in 11/2017 due to continued symptoms of angina despite multiple antianginal agents.  This demonstrated an atretic LIMA-LAD and an occluded LAD with left to left and left to right collaterals.  He had moderate nonobstructive disease elsewhere.  It was felt that his angina was related to his occluded LAD.  CTO intervention would be very difficult and therefore he is not an ideal candidate.  Continued medical therapy was recommended.  He was last seen in 11/2017.      Patrick [name] returns to discuss his risk for kidney transplant. He would like to look into this again.  He continues to note occasional episodes of angina with "over exertion".  He denies any change in his pattern and does not take prn NTG.  He denies significant shortness of breath.  He denies  syncope, paroxysmal nocturnal dyspnea, leg swelling.    Prior CV studies:   The following studies were reviewed today:  Cardiac Catheterization 12/04/17 LAD proximal 100 (left to left and right-to-left collaterals); D1 ostial 50 OM1 50 RCA proximal 50, mid 60 LIMA-LAD atretic Medical therapy; approach to LAD makes CTO intervention difficult  Nuclear stress test 09/02/17 EF 53, anteroseptal/apical anterior/apical septal/apical lateral/apical scar, no ischemia  Echo 01/25/16 Mild concentric LVH, EF 45-50, diffuse HK, grade 2 diastolic dysfunction, very mild aortic stenosis (mean 13, peak 26), MAC, mild MR, mild LAE  Nuclear stress test 01/08/15(Dr. Einar Gip) EF 55, very small area of ischemia in the basal inferior, inferoapical and apical septal regions; low risk  Echo 11/29/13 Mild LVH, EF 88-28, grade 1 diastolic dysfunction, MAC, mild LAE  Nuclear stress test 10/10/13 ([facility]) FINAL COMMENTS - Moderate apical and periapical (i.e. apex, apical anterior/ lateral/ inferior segments) ischemia status post regadenoson stress. - LVEF= 63%. Septal dyskinesis in a post- CABG pattern. Otherwise normal systolic function. - Mild RV uptake, suggestive of RV hypertrophy   Past Medical History:  Diagnosis Date  . Arthritis    "back" (09/24/2016)  . CHF (congestive heart failure) ([place])   . Coronary artery disease   . Dysrhythmia    aflutter s/p ablation 2018  . ESRD (end stage renal disease) on dialysis [facility])    "TTS; [place]" (09/24/2016)  . Gout   . Hernia of abdominal wall   . History of blood transfusion 2009- 2016   "I've had  several; low HgB"  . History of kidney stones    treated with nephrectomy  . Hypertension   . Impotence of organic origin   . Migraine    "stopped in my 30's; they were related to high BP" (09/24/2016)  . Myocardial infarction [facility]) '96  . Pneumonia 01/2015   " a touch & I was in the hosp."  . Secondary hyperparathyroidism ([place])   . Type 2 diabetes  mellitus (HCC)    no longer on medication since going on dialysis  . Wears glasses    Surgical Hx: The patient  has a past surgical history that includes Kidney transplant (2009); Appendectomy; AV fistula placement (Right, 09/18/2014); ir generic historical (05/11/2016); ir generic historical (05/11/2016); ir generic historical (05/11/2016); ir generic historical (05/11/2016); Cardiac catheterization (N/A, 06/04/2016); AV fistula placement (Left, 07/07/2016); Hernia repair (2017); Nephrectomy (2017); Cardiac catheterization (~ 2016); A-FLUTTER ABLATION (N/A, 09/24/2016); Coronary artery bypass graft (1997); ABDOMINAL AORTOGRAM W/LOWER EXTREMITY (N/A, 04/13/2017); PERIPHERAL VASCULAR INTERVENTION (04/13/2017); Thrombectomy w/ embolectomy (Left, 09/14/2017); Thrombectomy and revision of arterioventous (av) goretex  graft (Left, 10/08/2017); Insertion of dialysis catheter (N/A, 10/08/2017); UPPER EXTREMITY VENOGRAPHY (N/A, 11/16/2017); LEFT HEART CATH AND CORS/GRAFTS ANGIOGRAPHY (N/A, 12/04/2017); Colonoscopy; Exchange of a dialysis catheter (Left, 01/11/2018); and IR Fluoro Guide CV Line Left (03/12/2018).   Current Medications: Current Meds  Medication Sig  . acetaminophen (TYLENOL) 500 MG tablet Take 1,000 mg by mouth every 6 (six) hours as needed for moderate pain or headache.  . albuterol (PROVENTIL HFA;VENTOLIN HFA) 108 (90 Base) MCG/ACT inhaler Inhale 1-2 puffs into the lungs every 6 (six) hours as needed for wheezing or shortness of breath.  Marland Kitchen amLODipine (NORVASC) 5 MG tablet Take 1 tablet (5 mg total) by mouth [name].  Marland Kitchen aspirin EC 81 MG tablet Take 81 mg by mouth [name].  . calcium acetate (PHOSLO) 667 MG capsule Take 1,334 mg by mouth [name] with supper.   . clopidogrel (PLAVIX) 75 MG tablet Take 1 tablet (75 mg total) by mouth [name].  . diphenhydrAMINE (BENADRYL) 50 MG tablet Take 50 mg by mouth [name] as needed (prep for iv dye).   Marland Kitchen ezetimibe (ZETIA) 10 MG tablet Take 1 tablet (10 mg total) by mouth  [name].  . predniSONE (DELTASONE) 50 MG tablet Take 12 hours prior to cath, 6 hours prior and 1 hour prior  . repaglinide (PRANDIN) 0.5 MG tablet Take 1 tablet (0.5 mg total) by mouth 2 (two) times [name] before a meal.  . rosuvastatin (CRESTOR) 10 MG tablet Take 1 tablet (10 mg total) by mouth [name].  . traMADol (ULTRAM) 50 MG tablet Take 1 tablet (50 mg total) by mouth every 6 (six) hours as needed for moderate pain.  . [DISCONTINUED] isosorbide mononitrate (IMDUR) 60 MG 24 hr tablet Take 60 mg by mouth [name].     Allergies:   Lipitor [atorvastatin]; Metoprolol; and Iodinated diagnostic agents   Social History   Tobacco Use  . Smoking status: Former Smoker    Types: Cigarettes    Last attempt to quit: 07/04/2014    Years since quitting: 3.7  . Smokeless tobacco: Never Used  . Tobacco comment: "smoked ~ 1 pack/month when I did smoke; never a steady smoker"  Substance Use Topics  . Alcohol use: Yes    Alcohol/week: 0.0 standard drinks    Comment: rare  "2 drinks, 1-2 times/year"  . Drug use: No     Family Hx: The patient's family history includes Hyperlipidemia in his mother; Hypertension in his mother.  ROS:   Please see the history of present illness.    ROS All other systems reviewed and are negative.   EKGs/Labs/Other Test Reviewed:    EKG:  EKG is not ordered today.    Recent Labs: 11/30/2017: BUN 48; Creatinine, Ser 14.06; Platelets 197 01/11/2018: Hemoglobin 10.2; Potassium 4.7; Sodium 138 04/02/2018: ALT 6   Recent Lipid Panel Lab Results  Component Value Date/Time   CHOL 185 04/02/2018 07:27 AM   TRIG 218 (H) 04/02/2018 07:27 AM   HDL 40 04/02/2018 07:27 AM   CHOLHDL 4.6 04/02/2018 07:27 AM   CHOLHDL 5.6 05/25/2016 03:42 PM   LDLCALC 101 (H) 04/02/2018 07:27 AM    Physical Exam:    VS:  BP 130/70   Pulse 81   Ht 5' 6.5" (1.689 m)   Wt 232 lb 12.8 oz (105.6 kg)   SpO2 99%   BMI 37.01 kg/m     Wt Readings from Last 3 Encounters:  04/06/18 232 lb 12.8 oz  (105.6 kg)  04/05/18 235 lb 9.6 oz (106.9 kg)  02/10/18 229 lb (103.9 kg)     Physical Exam  Constitutional: He is oriented to person, place, and time. He appears well-developed and well-nourished. No distress.  HENT:  Head: Normocephalic and atraumatic.  Eyes: No scleral icterus.  Neck: No JVD present. No thyromegaly present.  Cardiovascular: Normal rate and regular rhythm.  No murmur heard. Pulmonary/Chest: Effort normal. He has no rales.  Abdominal: Soft. He exhibits no distension.  Musculoskeletal: He exhibits no edema.  Lymphadenopathy:    He has no cervical adenopathy.  Neurological: He is alert and oriented to person, place, and time.  Skin: Skin is warm and dry.  Psychiatric: He has a normal mood and affect.    ASSESSMENT & PLAN:    Coronary artery disease involving native coronary artery of native heart with angina pectoris (HCC) Hx of remote CABG.  Cardiac Catheterization in May 2019 demonstrated an atretic L-LAD and a chronically occluded LAD with L-L and R-L collaterals.  CTO PCI would be difficult and medical Rx was recommended.  He is overall stable with CCS Class II angina.  I will increase his dose of nitrates to see if we can control his symptoms more.  as far as renal transplant goes, his perioperative risk of major cardiac event is high at 11% according to the Revised Cardiac Risk Index (RCRI).  Given the results of his recent Cardiac Catheterization and his stable symptoms, he does not require any further cardiac testing.  I discussed his case with Dr. [name] who agreed he would be high risk for any procedure.  He would have to meet with the transplant surgeon to determine if he is a candidate for transplant.    - Increase Imdur to 90 mg QD  - Continue ASA, Plavix, Amlodpine, Ezetimibe, Rosuvastatin  Chronic diastolic CHF (congestive heart failure) ([place]) EF 45-50 by Echo in 2017.  Volume status appears stable.  His volume is managed by dialysis.  ESRD (end stage  renal disease) ([place]) He remains on Tues, Thurs, Sat dialysis.    Essential hypertension The patient's blood pressure is controlled on his current regimen.  Continue current therapy.    Hyperlipidemia, unspecified hyperlipidemia type Continue statin, ezetimibe.   Typical atrial flutter (HCC) No recurrence since his ablation in 2018.  He is note on long term anticoagulation.     Dispo:  Return in about 6 months (around 10/05/2018) for Routine Follow Up, w/ Dr. [name], or Nicki Reaper  Kathlen Mody, PA-C.   Medication Adjustments/Labs and Tests Ordered: Current medicines are reviewed at length with the patient today.  Concerns regarding medicines are outlined above.  Tests Ordered: No orders of the defined types were placed in this encounter.  Medication Changes: Meds ordered this encounter  Medications  . isosorbide mononitrate (IMDUR) 60 MG 24 hr tablet    Sig: Take 1.5 tablets (90 mg total) by mouth [name].    Dispense:  135 tablet    Refill:  3    DOSE INCREASE    Signed, Richardson Dopp, PA-C  04/06/2018 1:14 PM    [facility] [address], [place], [place]  [address] Phone: [phone]; Fax: [phone]

## 2018-04-08 DIAGNOSIS — N186 End stage renal disease: Secondary | ICD-10-CM | POA: Diagnosis not present

## 2018-04-08 DIAGNOSIS — D689 Coagulation defect, unspecified: Secondary | ICD-10-CM | POA: Diagnosis not present

## 2018-04-08 DIAGNOSIS — Z992 Dependence on renal dialysis: Secondary | ICD-10-CM | POA: Diagnosis not present

## 2018-04-08 DIAGNOSIS — Z23 Encounter for immunization: Secondary | ICD-10-CM | POA: Diagnosis not present

## 2018-04-08 DIAGNOSIS — N2581 Secondary hyperparathyroidism of renal origin: Secondary | ICD-10-CM | POA: Diagnosis not present

## 2018-04-08 DIAGNOSIS — E1029 Type 1 diabetes mellitus with other diabetic kidney complication: Secondary | ICD-10-CM | POA: Diagnosis not present

## 2018-04-09 DIAGNOSIS — J069 Acute upper respiratory infection, unspecified: Secondary | ICD-10-CM | POA: Diagnosis not present

## 2018-04-09 DIAGNOSIS — E119 Type 2 diabetes mellitus without complications: Secondary | ICD-10-CM | POA: Diagnosis not present

## 2018-04-09 DIAGNOSIS — N186 End stage renal disease: Secondary | ICD-10-CM | POA: Diagnosis not present

## 2018-04-09 DIAGNOSIS — Z23 Encounter for immunization: Secondary | ICD-10-CM | POA: Diagnosis not present

## 2018-04-09 DIAGNOSIS — I10 Essential (primary) hypertension: Secondary | ICD-10-CM | POA: Diagnosis not present

## 2018-04-10 DIAGNOSIS — D689 Coagulation defect, unspecified: Secondary | ICD-10-CM | POA: Diagnosis not present

## 2018-04-10 DIAGNOSIS — E1029 Type 1 diabetes mellitus with other diabetic kidney complication: Secondary | ICD-10-CM | POA: Diagnosis not present

## 2018-04-10 DIAGNOSIS — N186 End stage renal disease: Secondary | ICD-10-CM | POA: Diagnosis not present

## 2018-04-10 DIAGNOSIS — N2581 Secondary hyperparathyroidism of renal origin: Secondary | ICD-10-CM | POA: Diagnosis not present

## 2018-04-10 DIAGNOSIS — Z992 Dependence on renal dialysis: Secondary | ICD-10-CM | POA: Diagnosis not present

## 2018-04-10 DIAGNOSIS — Z23 Encounter for immunization: Secondary | ICD-10-CM | POA: Diagnosis not present

## 2018-04-13 DIAGNOSIS — Z992 Dependence on renal dialysis: Secondary | ICD-10-CM | POA: Diagnosis not present

## 2018-04-13 DIAGNOSIS — N2581 Secondary hyperparathyroidism of renal origin: Secondary | ICD-10-CM | POA: Diagnosis not present

## 2018-04-13 DIAGNOSIS — E1029 Type 1 diabetes mellitus with other diabetic kidney complication: Secondary | ICD-10-CM | POA: Diagnosis not present

## 2018-04-13 DIAGNOSIS — Z23 Encounter for immunization: Secondary | ICD-10-CM | POA: Diagnosis not present

## 2018-04-13 DIAGNOSIS — N186 End stage renal disease: Secondary | ICD-10-CM | POA: Diagnosis not present

## 2018-04-13 DIAGNOSIS — D689 Coagulation defect, unspecified: Secondary | ICD-10-CM | POA: Diagnosis not present

## 2018-04-14 DIAGNOSIS — Z992 Dependence on renal dialysis: Secondary | ICD-10-CM | POA: Diagnosis not present

## 2018-04-14 DIAGNOSIS — K430 Incisional hernia with obstruction, without gangrene: Secondary | ICD-10-CM | POA: Diagnosis not present

## 2018-04-14 DIAGNOSIS — N186 End stage renal disease: Secondary | ICD-10-CM | POA: Diagnosis not present

## 2018-04-15 ENCOUNTER — Other Ambulatory Visit (HOSPITAL_COMMUNITY): Payer: Self-pay | Admitting: Surgery

## 2018-04-15 DIAGNOSIS — Z23 Encounter for immunization: Secondary | ICD-10-CM | POA: Diagnosis not present

## 2018-04-15 DIAGNOSIS — D689 Coagulation defect, unspecified: Secondary | ICD-10-CM | POA: Diagnosis not present

## 2018-04-15 DIAGNOSIS — E1029 Type 1 diabetes mellitus with other diabetic kidney complication: Secondary | ICD-10-CM | POA: Diagnosis not present

## 2018-04-15 DIAGNOSIS — N186 End stage renal disease: Secondary | ICD-10-CM

## 2018-04-15 DIAGNOSIS — N2581 Secondary hyperparathyroidism of renal origin: Secondary | ICD-10-CM | POA: Diagnosis not present

## 2018-04-15 DIAGNOSIS — K469 Unspecified abdominal hernia without obstruction or gangrene: Secondary | ICD-10-CM

## 2018-04-15 DIAGNOSIS — Z992 Dependence on renal dialysis: Secondary | ICD-10-CM | POA: Diagnosis not present

## 2018-04-16 ENCOUNTER — Other Ambulatory Visit (HOSPITAL_COMMUNITY): Payer: Self-pay | Admitting: Surgery

## 2018-04-16 DIAGNOSIS — N186 End stage renal disease: Secondary | ICD-10-CM

## 2018-04-16 DIAGNOSIS — K469 Unspecified abdominal hernia without obstruction or gangrene: Secondary | ICD-10-CM

## 2018-04-17 DIAGNOSIS — N186 End stage renal disease: Secondary | ICD-10-CM | POA: Diagnosis not present

## 2018-04-17 DIAGNOSIS — D689 Coagulation defect, unspecified: Secondary | ICD-10-CM | POA: Diagnosis not present

## 2018-04-17 DIAGNOSIS — Z23 Encounter for immunization: Secondary | ICD-10-CM | POA: Diagnosis not present

## 2018-04-17 DIAGNOSIS — E1029 Type 1 diabetes mellitus with other diabetic kidney complication: Secondary | ICD-10-CM | POA: Diagnosis not present

## 2018-04-17 DIAGNOSIS — N2581 Secondary hyperparathyroidism of renal origin: Secondary | ICD-10-CM | POA: Diagnosis not present

## 2018-04-17 DIAGNOSIS — Z992 Dependence on renal dialysis: Secondary | ICD-10-CM | POA: Diagnosis not present

## 2018-04-20 ENCOUNTER — Ambulatory Visit (INDEPENDENT_AMBULATORY_CARE_PROVIDER_SITE_OTHER): Payer: Medicare Other | Admitting: Vascular Surgery

## 2018-04-20 ENCOUNTER — Encounter: Payer: Self-pay | Admitting: Vascular Surgery

## 2018-04-20 ENCOUNTER — Other Ambulatory Visit: Payer: Self-pay

## 2018-04-20 VITALS — BP 132/73 | HR 80 | Temp 98.1°F | Resp 19 | Ht 66.5 in | Wt 231.6 lb

## 2018-04-20 DIAGNOSIS — N186 End stage renal disease: Secondary | ICD-10-CM

## 2018-04-20 DIAGNOSIS — Z23 Encounter for immunization: Secondary | ICD-10-CM | POA: Diagnosis not present

## 2018-04-20 DIAGNOSIS — I70213 Atherosclerosis of native arteries of extremities with intermittent claudication, bilateral legs: Secondary | ICD-10-CM | POA: Diagnosis not present

## 2018-04-20 DIAGNOSIS — Z992 Dependence on renal dialysis: Secondary | ICD-10-CM | POA: Diagnosis not present

## 2018-04-20 DIAGNOSIS — E1029 Type 1 diabetes mellitus with other diabetic kidney complication: Secondary | ICD-10-CM | POA: Diagnosis not present

## 2018-04-20 DIAGNOSIS — N2581 Secondary hyperparathyroidism of renal origin: Secondary | ICD-10-CM | POA: Diagnosis not present

## 2018-04-20 DIAGNOSIS — D689 Coagulation defect, unspecified: Secondary | ICD-10-CM | POA: Diagnosis not present

## 2018-04-20 NOTE — Progress Notes (Signed)
[facility]  Patient name: Patrick [name] MRN: [id number] DOB: [birth year] Sex: male  REASON FOR VISIT: Discuss access for hemodialysis  HPI: Patrick [name] is a 62 y.o. male here today for discussion of hemodialysis access.  He has had multiple bilateral upper extremity access.  Has never had leg grafts.  Most recently he underwent attempted thrombectomy of left upper arm graft and was felt to potentially have stent causing repeated Fogarty catheter rupture.  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  He currently is being dialyzed via a left IJ catheter.  Has known prior occlusion of right internal jugular vein.  He is to have CT scan within the next week for evaluation of possible peritoneal dialysis.  Past Medical History:  Diagnosis Date  . Arthritis    "back" (09/24/2016)  . CHF (congestive heart failure) ([place])   . Coronary artery disease   . Dysrhythmia    aflutter s/p ablation 2018  . ESRD (end stage renal disease) on dialysis [facility])    "TTS; [place]" (09/24/2016)  . Gout   . Hernia of abdominal wall   . History of blood transfusion 2009- 2016   "I've had several; low HgB"  . History of kidney stones    treated with nephrectomy  . Hypertension   . Impotence of organic origin   . Migraine    "stopped in my 30's; they were related to high BP" (09/24/2016)  . Myocardial infarction [facility]) '96  . Pneumonia 01/2015   " a touch & I was in the hosp."  . Secondary hyperparathyroidism ([place])   . Type 2 diabetes mellitus (H[facility])    no longer on medication since going on dialysis  . Wears glasses     Family History  Problem Relation Age of Onset  . Hyperlipidemia Mother   . Hypertension Mother     SOCIAL HISTORY: Social History   Tobacco Use  . Smoking status: Former Smoker    Types: Cigarettes    Last attempt to  quit: 07/04/2014    Years since quitting: 3.7  . Smokeless tobacco: Never Used  . Tobacco comment: "smoked ~ 1 pack/month when I did smoke; never a steady smoker"  Substance Use Topics  . Alcohol use: Yes    Alcohol/week: 0.0 standard drinks    Comment: rare  "2 drinks, 1-2 times/year"    Allergies  Allergen Reactions  . Lipitor [Atorvastatin] Other (See Comments)    Leg pain  . Metoprolol Other (See Comments)    Headaches   . Iodinated Diagnostic Agents Swelling, Rash and Other (See Comments)    Other Reaction: burning to mouth, swelling of lips.    Current Outpatient Medications  Medication Sig Dispense Refill  . acetaminophen (TYLENOL) 500 MG tablet Take 1,000 mg by mouth every 6 (six) hours as needed for moderate pain or headache.    . albuterol (PROVENTIL HFA;VENTOLIN HFA) 108 (90 Base) MCG/ACT inhaler Inhale 1-2 puffs into the lungs every 6 (six) hours as needed for wheezing or shortness of breath. 1 Inhaler 0  . aspirin EC 81 MG tablet Take 81 mg by mouth daily.    . calcium acetate (PHOSLO) 667 MG capsule Take 1,334 mg by mouth daily with supper.     . clopidogrel (PLAVIX) 75 MG tablet Take 1 tablet (75 mg total) by mouth daily. 90 tablet 3  . diphenhydrAMINE (BENADRYL)  50 MG tablet Take 50 mg by mouth daily as needed (prep for iv dye).     Marland Kitchen ezetimibe (ZETIA) 10 MG tablet Take 1 tablet (10 mg total) by mouth daily. 90 tablet 3  . isosorbide mononitrate (IMDUR) 60 MG 24 hr tablet Take 1.5 tablets (90 mg total) by mouth daily. 135 tablet 3  . predniSONE (DELTASONE) 50 MG tablet Take 12 hours prior to cath, 6 hours prior and 1 hour prior 3 tablet 0  . repaglinide (PRANDIN) 0.5 MG tablet Take 1 tablet (0.5 mg total) by mouth 2 (two) times daily before a meal. 180 tablet 3  . traMADol (ULTRAM) 50 MG tablet Take 1 tablet (50 mg total) by mouth every 6 (six) hours as needed for moderate pain. 8 tablet 0  . amLODipine (NORVASC) 5 MG tablet Take 1 tablet (5 mg total) by mouth daily.  90 tablet 3  . rosuvastatin (CRESTOR) 10 MG tablet Take 1 tablet (10 mg total) by mouth daily. 90 tablet 3   No current facility-administered medications for this visit.     REVIEW OF SYSTEMS:  [X]  denotes positive finding, [ ]  denotes negative finding Cardiac  Comments:  Chest pain or chest pressure: x   Shortness of breath upon exertion:    Short of breath when lying flat:    Irregular heart rhythm:        Vascular    Pain in calf, thigh, or hip brought on by ambulation: x   Pain in feet at night that wakes you up from your sleep:     Blood clot in your veins:    Leg swelling:           PHYSICAL EXAM: Vitals:   04/20/18 1148  BP: 132/73  Pulse: 80  Resp: 19  Temp: 98.1 F (36.7 C)  TempSrc: Oral  SpO2: 96%  Weight: 231 lb 9.6 oz (105.1 kg)  Height: 5' 6.5" (1.689 m)    GENERAL: The patient is a well-nourished male, in no acute distress. The vital signs are documented above. CARDIOVASCULAR: Palpable radial pulses bilaterally.  Extensive prior occluded forearm and upper arm graft bilaterally PULMONARY: There is good air exchange  MUSCULOSKELETAL: There are no major deformities or cyanosis. NEUROLOGIC: No focal weakness or paresthesias are detected. SKIN: There are no ulcers or rashes noted. PSYCHIATRIC: The patient has a normal affect.  DATA:  Reviewed his former studies and also chest x-ray showing what appears to be an innominate vein stent on the right  MEDICAL ISSUES: Had a long discussion regarding options for hemodialysis access.  I would recommend a left arm venogram to determine if he has any possibility of new left arm graft.  He is been deemed to not have any option for right arm graft.  If this is not possible would then place a femoral graft.  He does have 2+ femoral pulses bilaterally.  He has been told in the past that this may lead to limb loss.  I explained that this certainly would be possible but very unlikely and he really does not have any other  options if he cannot have a left upper extremity graft.  He wishes to defer any further evaluation until he has had his peritoneal dialysis consultation to determine if this is an option.  He is having adequate use of his catheter currently.  He will notify our office if he wishes to proceed with left arm venogram as an outpatient    Rosetta Posner, MD FACS  [facility] Tel [phone] Pager [phone]

## 2018-04-22 DIAGNOSIS — D689 Coagulation defect, unspecified: Secondary | ICD-10-CM | POA: Diagnosis not present

## 2018-04-22 DIAGNOSIS — N186 End stage renal disease: Secondary | ICD-10-CM | POA: Diagnosis not present

## 2018-04-22 DIAGNOSIS — I4891 Unspecified atrial fibrillation: Secondary | ICD-10-CM | POA: Diagnosis not present

## 2018-04-22 DIAGNOSIS — N2581 Secondary hyperparathyroidism of renal origin: Secondary | ICD-10-CM | POA: Diagnosis not present

## 2018-04-22 DIAGNOSIS — Z23 Encounter for immunization: Secondary | ICD-10-CM | POA: Diagnosis not present

## 2018-04-22 DIAGNOSIS — E1029 Type 1 diabetes mellitus with other diabetic kidney complication: Secondary | ICD-10-CM | POA: Diagnosis not present

## 2018-04-22 DIAGNOSIS — Z992 Dependence on renal dialysis: Secondary | ICD-10-CM | POA: Diagnosis not present

## 2018-04-23 ENCOUNTER — Ambulatory Visit (HOSPITAL_COMMUNITY): Payer: Medicare Other

## 2018-04-24 DIAGNOSIS — Z23 Encounter for immunization: Secondary | ICD-10-CM | POA: Diagnosis not present

## 2018-04-24 DIAGNOSIS — Z992 Dependence on renal dialysis: Secondary | ICD-10-CM | POA: Diagnosis not present

## 2018-04-24 DIAGNOSIS — N2581 Secondary hyperparathyroidism of renal origin: Secondary | ICD-10-CM | POA: Diagnosis not present

## 2018-04-24 DIAGNOSIS — D689 Coagulation defect, unspecified: Secondary | ICD-10-CM | POA: Diagnosis not present

## 2018-04-24 DIAGNOSIS — E1029 Type 1 diabetes mellitus with other diabetic kidney complication: Secondary | ICD-10-CM | POA: Diagnosis not present

## 2018-04-24 DIAGNOSIS — N186 End stage renal disease: Secondary | ICD-10-CM | POA: Diagnosis not present

## 2018-04-26 ENCOUNTER — Ambulatory Visit (HOSPITAL_COMMUNITY)
Admission: RE | Admit: 2018-04-26 | Discharge: 2018-04-26 | Disposition: A | Discharge status: Home / Self Care | Payer: Medicare Other | Source: Ambulatory Visit | Attending: Surgery | Admitting: Surgery

## 2018-04-26 DIAGNOSIS — I700 Atherosclerosis of aorta: Secondary | ICD-10-CM | POA: Diagnosis not present

## 2018-04-26 DIAGNOSIS — K5730 Diverticulosis of large intestine without perforation or abscess without bleeding: Secondary | ICD-10-CM | POA: Diagnosis not present

## 2018-04-26 DIAGNOSIS — K449 Diaphragmatic hernia without obstruction or gangrene: Secondary | ICD-10-CM | POA: Insufficient documentation

## 2018-04-26 DIAGNOSIS — I708 Atherosclerosis of other arteries: Secondary | ICD-10-CM | POA: Insufficient documentation

## 2018-04-26 DIAGNOSIS — K469 Unspecified abdominal hernia without obstruction or gangrene: Secondary | ICD-10-CM | POA: Insufficient documentation

## 2018-04-26 DIAGNOSIS — N281 Cyst of kidney, acquired: Secondary | ICD-10-CM | POA: Diagnosis not present

## 2018-04-26 DIAGNOSIS — R935 Abnormal findings on diagnostic imaging of other abdominal regions, including retroperitoneum: Secondary | ICD-10-CM | POA: Insufficient documentation

## 2018-04-26 DIAGNOSIS — N186 End stage renal disease: Secondary | ICD-10-CM | POA: Insufficient documentation

## 2018-04-26 DIAGNOSIS — I2510 Atherosclerotic heart disease of native coronary artery without angina pectoris: Secondary | ICD-10-CM | POA: Diagnosis not present

## 2018-04-27 DIAGNOSIS — N2581 Secondary hyperparathyroidism of renal origin: Secondary | ICD-10-CM | POA: Diagnosis not present

## 2018-04-27 DIAGNOSIS — R7881 Bacteremia: Secondary | ICD-10-CM | POA: Diagnosis not present

## 2018-04-27 DIAGNOSIS — D689 Coagulation defect, unspecified: Secondary | ICD-10-CM | POA: Diagnosis not present

## 2018-04-27 DIAGNOSIS — N186 End stage renal disease: Secondary | ICD-10-CM | POA: Diagnosis not present

## 2018-04-27 DIAGNOSIS — J189 Pneumonia, unspecified organism: Secondary | ICD-10-CM | POA: Diagnosis not present

## 2018-04-27 DIAGNOSIS — R509 Fever, unspecified: Secondary | ICD-10-CM | POA: Diagnosis not present

## 2018-04-27 DIAGNOSIS — D631 Anemia in chronic kidney disease: Secondary | ICD-10-CM | POA: Diagnosis not present

## 2018-04-27 DIAGNOSIS — J14 Pneumonia due to Hemophilus influenzae: Secondary | ICD-10-CM | POA: Diagnosis not present

## 2018-04-27 DIAGNOSIS — Z992 Dependence on renal dialysis: Secondary | ICD-10-CM | POA: Diagnosis not present

## 2018-04-27 DIAGNOSIS — T8610 Unspecified complication of kidney transplant: Secondary | ICD-10-CM | POA: Diagnosis not present

## 2018-04-29 DIAGNOSIS — R7881 Bacteremia: Secondary | ICD-10-CM | POA: Diagnosis not present

## 2018-04-29 DIAGNOSIS — J189 Pneumonia, unspecified organism: Secondary | ICD-10-CM | POA: Diagnosis not present

## 2018-04-29 DIAGNOSIS — D689 Coagulation defect, unspecified: Secondary | ICD-10-CM | POA: Diagnosis not present

## 2018-04-29 DIAGNOSIS — R509 Fever, unspecified: Secondary | ICD-10-CM | POA: Diagnosis not present

## 2018-04-29 DIAGNOSIS — N186 End stage renal disease: Secondary | ICD-10-CM | POA: Diagnosis not present

## 2018-05-01 DIAGNOSIS — N186 End stage renal disease: Secondary | ICD-10-CM | POA: Diagnosis not present

## 2018-05-01 DIAGNOSIS — R509 Fever, unspecified: Secondary | ICD-10-CM | POA: Diagnosis not present

## 2018-05-01 DIAGNOSIS — R7881 Bacteremia: Secondary | ICD-10-CM | POA: Diagnosis not present

## 2018-05-01 DIAGNOSIS — J189 Pneumonia, unspecified organism: Secondary | ICD-10-CM | POA: Diagnosis not present

## 2018-05-01 DIAGNOSIS — D689 Coagulation defect, unspecified: Secondary | ICD-10-CM | POA: Diagnosis not present

## 2018-05-04 DIAGNOSIS — R509 Fever, unspecified: Secondary | ICD-10-CM | POA: Diagnosis not present

## 2018-05-04 DIAGNOSIS — R7881 Bacteremia: Secondary | ICD-10-CM | POA: Diagnosis not present

## 2018-05-04 DIAGNOSIS — N186 End stage renal disease: Secondary | ICD-10-CM | POA: Diagnosis not present

## 2018-05-04 DIAGNOSIS — J189 Pneumonia, unspecified organism: Secondary | ICD-10-CM | POA: Diagnosis not present

## 2018-05-04 DIAGNOSIS — D689 Coagulation defect, unspecified: Secondary | ICD-10-CM | POA: Diagnosis not present

## 2018-05-05 DIAGNOSIS — N186 End stage renal disease: Secondary | ICD-10-CM | POA: Diagnosis not present

## 2018-05-05 DIAGNOSIS — K430 Incisional hernia with obstruction, without gangrene: Secondary | ICD-10-CM | POA: Diagnosis not present

## 2018-05-05 DIAGNOSIS — R188 Other ascites: Secondary | ICD-10-CM | POA: Diagnosis not present

## 2018-05-06 DIAGNOSIS — N186 End stage renal disease: Secondary | ICD-10-CM | POA: Diagnosis not present

## 2018-05-06 DIAGNOSIS — D689 Coagulation defect, unspecified: Secondary | ICD-10-CM | POA: Diagnosis not present

## 2018-05-06 DIAGNOSIS — J189 Pneumonia, unspecified organism: Secondary | ICD-10-CM | POA: Diagnosis not present

## 2018-05-06 DIAGNOSIS — R509 Fever, unspecified: Secondary | ICD-10-CM | POA: Diagnosis not present

## 2018-05-06 DIAGNOSIS — R7881 Bacteremia: Secondary | ICD-10-CM | POA: Diagnosis not present

## 2018-05-08 DIAGNOSIS — J189 Pneumonia, unspecified organism: Secondary | ICD-10-CM | POA: Diagnosis not present

## 2018-05-08 DIAGNOSIS — R7881 Bacteremia: Secondary | ICD-10-CM | POA: Diagnosis not present

## 2018-05-08 DIAGNOSIS — R509 Fever, unspecified: Secondary | ICD-10-CM | POA: Diagnosis not present

## 2018-05-08 DIAGNOSIS — N186 End stage renal disease: Secondary | ICD-10-CM | POA: Diagnosis not present

## 2018-05-08 DIAGNOSIS — D689 Coagulation defect, unspecified: Secondary | ICD-10-CM | POA: Diagnosis not present

## 2018-05-11 ENCOUNTER — Encounter (HOSPITAL_COMMUNITY): Payer: Self-pay | Admitting: Emergency Medicine

## 2018-05-11 ENCOUNTER — Other Ambulatory Visit: Payer: Self-pay

## 2018-05-11 ENCOUNTER — Emergency Department (HOSPITAL_COMMUNITY): Payer: Medicare Other

## 2018-05-11 ENCOUNTER — Inpatient Hospital Stay (HOSPITAL_COMMUNITY)
Admission: EM | Admit: 2018-05-11 | Discharge: 2018-05-18 | DRG: 871 | Disposition: A | Discharge status: Home Health | Payer: Medicare Other | Attending: Internal Medicine | Admitting: Internal Medicine

## 2018-05-11 DIAGNOSIS — R5381 Other malaise: Secondary | ICD-10-CM

## 2018-05-11 DIAGNOSIS — I739 Peripheral vascular disease, unspecified: Secondary | ICD-10-CM | POA: Diagnosis present

## 2018-05-11 DIAGNOSIS — J188 Other pneumonia, unspecified organism: Secondary | ICD-10-CM | POA: Diagnosis present

## 2018-05-11 DIAGNOSIS — Z79899 Other long term (current) drug therapy: Secondary | ICD-10-CM

## 2018-05-11 DIAGNOSIS — J189 Pneumonia, unspecified organism: Secondary | ICD-10-CM

## 2018-05-11 DIAGNOSIS — R059 Cough, unspecified: Secondary | ICD-10-CM

## 2018-05-11 DIAGNOSIS — R509 Fever, unspecified: Secondary | ICD-10-CM | POA: Diagnosis not present

## 2018-05-11 DIAGNOSIS — I2510 Atherosclerotic heart disease of native coronary artery without angina pectoris: Secondary | ICD-10-CM | POA: Diagnosis present

## 2018-05-11 DIAGNOSIS — I480 Paroxysmal atrial fibrillation: Secondary | ICD-10-CM | POA: Diagnosis present

## 2018-05-11 DIAGNOSIS — N186 End stage renal disease: Secondary | ICD-10-CM | POA: Diagnosis not present

## 2018-05-11 DIAGNOSIS — A419 Sepsis, unspecified organism: Secondary | ICD-10-CM

## 2018-05-11 DIAGNOSIS — Z87442 Personal history of urinary calculi: Secondary | ICD-10-CM | POA: Diagnosis not present

## 2018-05-11 DIAGNOSIS — Z79891 Long term (current) use of opiate analgesic: Secondary | ICD-10-CM

## 2018-05-11 DIAGNOSIS — I10 Essential (primary) hypertension: Secondary | ICD-10-CM | POA: Diagnosis present

## 2018-05-11 DIAGNOSIS — Z22322 Carrier or suspected carrier of Methicillin resistant Staphylococcus aureus: Secondary | ICD-10-CM

## 2018-05-11 DIAGNOSIS — R05 Cough: Secondary | ICD-10-CM

## 2018-05-11 DIAGNOSIS — J9601 Acute respiratory failure with hypoxia: Secondary | ICD-10-CM | POA: Diagnosis not present

## 2018-05-11 DIAGNOSIS — Z951 Presence of aortocoronary bypass graft: Secondary | ICD-10-CM

## 2018-05-11 DIAGNOSIS — M109 Gout, unspecified: Secondary | ICD-10-CM | POA: Diagnosis present

## 2018-05-11 DIAGNOSIS — E876 Hypokalemia: Secondary | ICD-10-CM | POA: Diagnosis present

## 2018-05-11 DIAGNOSIS — E785 Hyperlipidemia, unspecified: Secondary | ICD-10-CM | POA: Diagnosis present

## 2018-05-11 DIAGNOSIS — I120 Hypertensive chronic kidney disease with stage 5 chronic kidney disease or end stage renal disease: Secondary | ICD-10-CM | POA: Diagnosis not present

## 2018-05-11 DIAGNOSIS — Z905 Acquired absence of kidney: Secondary | ICD-10-CM

## 2018-05-11 DIAGNOSIS — T8611 Kidney transplant rejection: Secondary | ICD-10-CM | POA: Diagnosis present

## 2018-05-11 DIAGNOSIS — Z992 Dependence on renal dialysis: Secondary | ICD-10-CM

## 2018-05-11 DIAGNOSIS — Z87891 Personal history of nicotine dependence: Secondary | ICD-10-CM

## 2018-05-11 DIAGNOSIS — D649 Anemia, unspecified: Secondary | ICD-10-CM

## 2018-05-11 DIAGNOSIS — Z91041 Radiographic dye allergy status: Secondary | ICD-10-CM

## 2018-05-11 DIAGNOSIS — E1122 Type 2 diabetes mellitus with diabetic chronic kidney disease: Secondary | ICD-10-CM | POA: Diagnosis present

## 2018-05-11 DIAGNOSIS — J14 Pneumonia due to Hemophilus influenzae: Secondary | ICD-10-CM | POA: Diagnosis present

## 2018-05-11 DIAGNOSIS — A4159 Other Gram-negative sepsis: Secondary | ICD-10-CM | POA: Diagnosis not present

## 2018-05-11 DIAGNOSIS — R042 Hemoptysis: Secondary | ICD-10-CM | POA: Diagnosis present

## 2018-05-11 DIAGNOSIS — Y95 Nosocomial condition: Secondary | ICD-10-CM | POA: Diagnosis present

## 2018-05-11 DIAGNOSIS — Z8249 Family history of ischemic heart disease and other diseases of the circulatory system: Secondary | ICD-10-CM

## 2018-05-11 DIAGNOSIS — N2581 Secondary hyperparathyroidism of renal origin: Secondary | ICD-10-CM | POA: Diagnosis present

## 2018-05-11 DIAGNOSIS — I959 Hypotension, unspecified: Secondary | ICD-10-CM | POA: Diagnosis not present

## 2018-05-11 DIAGNOSIS — I252 Old myocardial infarction: Secondary | ICD-10-CM

## 2018-05-11 DIAGNOSIS — D631 Anemia in chronic kidney disease: Secondary | ICD-10-CM | POA: Diagnosis present

## 2018-05-11 DIAGNOSIS — Z7982 Long term (current) use of aspirin: Secondary | ICD-10-CM

## 2018-05-11 DIAGNOSIS — R7881 Bacteremia: Secondary | ICD-10-CM | POA: Diagnosis not present

## 2018-05-11 DIAGNOSIS — D689 Coagulation defect, unspecified: Secondary | ICD-10-CM | POA: Diagnosis not present

## 2018-05-11 DIAGNOSIS — I5043 Acute on chronic combined systolic (congestive) and diastolic (congestive) heart failure: Secondary | ICD-10-CM | POA: Diagnosis present

## 2018-05-11 DIAGNOSIS — Z8349 Family history of other endocrine, nutritional and metabolic diseases: Secondary | ICD-10-CM

## 2018-05-11 DIAGNOSIS — R0602 Shortness of breath: Secondary | ICD-10-CM | POA: Diagnosis not present

## 2018-05-11 DIAGNOSIS — R6520 Severe sepsis without septic shock: Secondary | ICD-10-CM | POA: Diagnosis present

## 2018-05-11 DIAGNOSIS — Z888 Allergy status to other drugs, medicaments and biological substances status: Secondary | ICD-10-CM

## 2018-05-11 DIAGNOSIS — R0902 Hypoxemia: Secondary | ICD-10-CM | POA: Diagnosis not present

## 2018-05-11 DIAGNOSIS — Z7902 Long term (current) use of antithrombotics/antiplatelets: Secondary | ICD-10-CM

## 2018-05-11 DIAGNOSIS — I132 Hypertensive heart and chronic kidney disease with heart failure and with stage 5 chronic kidney disease, or end stage renal disease: Secondary | ICD-10-CM | POA: Diagnosis present

## 2018-05-11 DIAGNOSIS — I5041 Acute combined systolic (congestive) and diastolic (congestive) heart failure: Secondary | ICD-10-CM

## 2018-05-11 DIAGNOSIS — Z973 Presence of spectacles and contact lenses: Secondary | ICD-10-CM

## 2018-05-11 LAB — COMPREHENSIVE METABOLIC PANEL
ALT: 12 U/L (ref 0–44)
AST: 34 U/L (ref 15–41)
Albumin: 3.4 g/dL — ABNORMAL LOW (ref 3.5–5.0)
Alkaline Phosphatase: 53 U/L (ref 38–126)
Anion gap: 13 (ref 5–15)
BUN: 25 mg/dL — AB (ref 8–23)
CHLORIDE: 99 mmol/L (ref 98–111)
CO2: 22 mmol/L (ref 22–32)
CREATININE: 9.83 mg/dL — AB (ref 0.61–1.24)
Calcium: 10.2 mg/dL (ref 8.9–10.3)
GFR calc Af Amer: 6 mL/min — ABNORMAL LOW (ref >60)
GFR, EST NON AFRICAN AMERICAN: 5 mL/min — AB (ref >60)
GLUCOSE: 158 mg/dL — AB (ref 70–99)
Potassium: 3.2 mmol/L — ABNORMAL LOW (ref 3.5–5.1)
Sodium: 134 mmol/L — ABNORMAL LOW (ref 135–145)
Total Bilirubin: 1 mg/dL (ref 0.3–1.2)
Total Protein: 7.3 g/dL (ref 6.5–8.1)

## 2018-05-11 LAB — CBC WITH DIFFERENTIAL/PLATELET
Abs Immature Granulocytes: 0.14 10*3/uL — ABNORMAL HIGH (ref 0.00–0.07)
Basophils Absolute: 0 10*3/uL (ref 0.0–0.1)
Basophils Relative: 0 %
EOS PCT: 0 %
Eosinophils Absolute: 0 10*3/uL (ref 0.0–0.5)
HCT: 32.7 % — ABNORMAL LOW (ref 39.0–52.0)
HEMOGLOBIN: 10.2 g/dL — AB (ref 13.0–17.0)
Immature Granulocytes: 1 %
LYMPHS ABS: 0.9 10*3/uL (ref 0.7–4.0)
LYMPHS PCT: 8 %
MCH: 29.7 pg (ref 26.0–34.0)
MCHC: 31.2 g/dL (ref 30.0–36.0)
MCV: 95.1 fL (ref 80.0–100.0)
MONO ABS: 0.9 10*3/uL (ref 0.1–1.0)
MONOS PCT: 8 %
Neutro Abs: 9.6 10*3/uL — ABNORMAL HIGH (ref 1.7–7.7)
Neutrophils Relative %: 83 %
Platelets: 174 10*3/uL (ref 150–400)
RBC: 3.44 MIL/uL — ABNORMAL LOW (ref 4.22–5.81)
RDW: 16 % — ABNORMAL HIGH (ref 11.5–15.5)
WBC: 11.5 10*3/uL — AB (ref 4.0–10.5)
nRBC: 0 % (ref 0.0–0.2)

## 2018-05-11 LAB — I-STAT CG4 LACTIC ACID, ED
LACTIC ACID, VENOUS: 1.89 mmol/L (ref 0.5–1.9)
Lactic Acid, Venous: 1.84 mmol/L (ref 0.5–1.9)

## 2018-05-11 LAB — VANCOMYCIN, TROUGH: VANCOMYCIN TR: 14 ug/mL — AB (ref 15–20)

## 2018-05-11 MED ORDER — SODIUM CHLORIDE 0.9 % IV SOLN
2.0000 g | Freq: Once | INTRAVENOUS | Status: AC
  Administered 2018-05-11: 2 g via INTRAVENOUS
  Filled 2018-05-11: qty 2

## 2018-05-11 MED ORDER — VANCOMYCIN HCL IN DEXTROSE 1-5 GM/200ML-% IV SOLN
1000.0000 mg | INTRAVENOUS | Status: DC
  Administered 2018-05-13 – 2018-05-16 (×2): 1000 mg via INTRAVENOUS
  Filled 2018-05-11 (×3): qty 200

## 2018-05-11 MED ORDER — SODIUM CHLORIDE 0.9 % IV SOLN
2.0000 g | INTRAVENOUS | Status: DC
  Administered 2018-05-13: 2 g via INTRAVENOUS
  Filled 2018-05-11 (×2): qty 2

## 2018-05-11 NOTE — ED Notes (Signed)
ED Provider at bedside. 

## 2018-05-11 NOTE — ED Notes (Signed)
Patient transported to X-ray 

## 2018-05-11 NOTE — Progress Notes (Signed)
Pharmacy Antibiotic Note  Patrick [name] is a 62 y.o. male admitted on 05/11/2018 with sepsis.  Pharmacy has been consulted for cefepime and vancomycin dosing. Pt has ESRD on dialysis TTS; went to dialysis today and after dialysis he received antibiotics and was told he needed a chest x-ray. Spoke with pt and he is unaware of which antibiotics he was given. Pt also reports complaints of fever and chills for 2 days along with N/V/D. CXR reveals patchy airspace disease, suspicious for multifocal PNA. WBC - 11.5, afebrile, lactic acid - 1.89  Post-HD vancomycin trough goal 5-15 Vanc trough-14; appropriate for post-HD goal    Plan: Cefepime 2g x1, then 2g qHD (TTS) Vancomycin 1g qHD (TTS) Monitor HD schedule Monitor and adjust abx pending renal plans and C&S   Height: 5\' 7"  (170.2 cm) Weight: 228 lb (103.4 kg) IBW/kg (Calculated) : 66.1  Temp (24hrs), Avg:99.2 F (37.3 C), Min:99.2 F (37.3 C), Max:99.2 F (37.3 C)  Recent Labs  Lab 05/11/18 1735 05/11/18 1742  WBC 11.5*  --   CREATININE 9.83*  --   LATICACIDVEN  --  1.89    Estimated Creatinine Clearance: 8.8 mL/min (A) (by C-G formula based on SCr of 9.83 mg/dL (H)).    Allergies  Allergen Reactions  . Lipitor [Atorvastatin] Other (See Comments)    Leg pain  . Metoprolol Other (See Comments)    Headaches   . Iodinated Diagnostic Agents Swelling, Rash and Other (See Comments)    Other Reaction: burning to mouth, swelling of lips.    Antimicrobials this admission: Cefepime 10/15 >>  Vancomycin 10/15 >>  Dose adjustments this admission: N/A  Microbiology results: Pending  Thank you for allowing pharmacy to be a part of this patient's care.  Tyson Babinski 05/11/2018 6:57 PM

## 2018-05-11 NOTE — ED Provider Notes (Signed)
[facility] EMERGENCY DEPARTMENT Provider Note   CSN: [id number] Arrival date & time: 05/11/18  1646     History   Chief Complaint Chief Complaint  Patient presents with  . Fever    HPI Patrick [name] is a 62 y.o. male.  Patient with fever and body aches and flulike symptoms for the last 2 days.  Went to dialysis today.  Received full dialysis.  Had blood cultures taken at dialysis and was given IV antibiotics while he was there.  He was told to come for chest x-ray.  Patient went back home and came back here.  Patient took Tylenol with relief of symptoms.  Denies any nausea, vomiting, diarrhea, abdominal pain.  The history is provided by the patient.  Fever   This is a new problem. The current episode started yesterday. The problem occurs daily. The maximum temperature noted was 100 to 100.9 F. Associated symptoms include muscle aches and cough. Pertinent negatives include no chest pain, no fussiness, no sleepiness, no diarrhea, no vomiting, no congestion, no headaches, no sore throat and no tugging at ear. He has tried acetaminophen for the symptoms. The treatment provided moderate relief.    Past Medical History:  Diagnosis Date  . Arthritis    "back" (09/24/2016)  . CHF (congestive heart failure) ([place])   . Coronary artery disease   . Dysrhythmia    aflutter s/p ablation 2018  . ESRD (end stage renal disease) on dialysis [facility])    "TTS; [place]" (09/24/2016)  . Gout   . Hernia of abdominal wall   . History of blood transfusion 2009- 2016   "I've had several; low HgB"  . History of kidney stones    treated with nephrectomy  . Hypertension   . Impotence of organic origin   . Migraine    "stopped in my 30's; they were related to high BP" (09/24/2016)  . Myocardial infarction [facility]) '96  . Pneumonia 01/2015   " a touch & I was in the hosp."  . Secondary hyperparathyroidism ([place])   . Type 2 diabetes mellitus (HCC)    no longer on medication since  going on dialysis  . Wears glasses     Patient Active Problem List   Diagnosis Date Noted  . Multifocal pneumonia 05/11/2018  . Allergy to IVP dye, subsequent encounter   . CAD (coronary artery disease) 11/27/2017  . Atherosclerosis of native arteries of extremity with intermittent claudication ([place]) 04/03/2017  . PAD (peripheral artery disease) ([place]) 02/02/2017  . Typical atrial flutter ([place]) 09/24/2016  . PAF (paroxysmal atrial fibrillation) ([place]) 05/25/2016  . ESRD (end stage renal disease) ([place]) 05/11/2016  . Anemia of chronic disease   . End-stage renal disease on hemodialysis ([place])   . Renovascular hypertension   . History of MI (myocardial infarction)   . Acute respiratory failure with hypoxia ([place])   . Controlled diabetes mellitus type 2 with complications ([place])   . Pleural effusion   . Symptomatic anemia 01/22/2016  . Acute respiratory failure ([place]) 01/22/2016  . Chronic diastolic CHF (congestive heart failure) ([place]) 01/22/2016  . Elevated troponin 01/22/2016  . Febrile illness 01/22/2016  . Normocytic anemia 11/17/2015  . Gynecomastia 11/17/2015  . Renal stone 11/17/2015  . Cough 11/17/2015  . CAP (community acquired pneumonia)   . HCAP (healthcare-associated pneumonia) 10/11/2015  . Acute respiratory disease 10/11/2015  . Protein-calorie malnutrition, severe ([place]) 09/16/2014  . 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 Metabolic acidosis, NAG, failure of bicarbonate regeneration  09/14/2014  . Anemia in chronic kidney disease 11/24/2013  . 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 Hypocalcemia 11/24/2013  . Low bicarbonate 11/24/2013  . Diabetes mellitus type 2, controlled ([place]) 10/20/2013  . Essential hypertension 03/19/2013  . Acute gout 03/19/2013  . Impotence of organic origin 03/19/2013  . Secondary hyperparathyroidism ([place]) 03/19/2013  . End-stage renal failure with renal transplant ([place]) 03/19/2013  . 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 A-FLUTTER ABLATION N/A 09/24/2016   Procedure: A-Flutter Ablation;  Surgeon: Will Meredith Leeds, MD;  Location: [facility] CV LAB;  Service: Cardiovascular;  Laterality: N/A;  . ABDOMINAL AORTOGRAM W/LOWER EXTREMITY N/A 04/13/2017   Procedure: ABDOMINAL AORTOGRAM W/LOWER EXTREMITY;  Surgeon: Conrad Lititz, MD;  Location: [facility] CV LAB;  Service: Cardiovascular;  Laterality: N/A;  Bilater lower extermity  . APPENDECTOMY    . AV FISTULA PLACEMENT Right 09/18/2014   Procedure: INSERTION OF ARTERIOVENOUS (AV) GORE-TEX GRAFT ARM USING 4-7MM  X 45CM STRETCH GORE-TEX VASCULAR GRAFT;  Surgeon: Rosetta Posner, MD;  Location: [facility];  Service: Vascular;  Laterality: Right;  . AV FISTULA PLACEMENT Left 07/07/2016   Procedure: INSERTION OF LEFT BRACHIAL TO AXILLARY ARTERIOVENOUS (AV) GORE-TEX ARM GRAFT;  Surgeon: Conrad Coahoma, MD;  Location: [facility];  Service: Vascular;  Laterality: Left;  . CARDIAC CATHETERIZATION  ~ 2016  . COLONOSCOPY    . 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 EXCHANGE OF A DIALYSIS CATHETER Left 01/11/2018   Procedure: EXCHANGE OF TUNNELED DIALYSIS CATHETER;  Surgeon: Rosetta Posner, MD;  Location: [facility];  Service: Vascular;  Laterality: Left;  . HERNIA REPAIR  2017   with nephrectomy  . INSERTION OF DIALYSIS CATHETER N/A 10/08/2017   Procedure: INSERTION OF TUNNELED DIALYSIS CATHETER;  Surgeon: Conrad [place], MD;  Location: [facility];  Service: Vascular;  Laterality: N/A;  . IR FLUORO GUIDE CV LINE LEFT  03/12/2018  . IR GENERIC HISTORICAL  05/11/2016   IR FLUORO GUIDE CV LINE LEFT 05/11/2016 Corrie Mckusick, DO [facility]-INTER[facility]  . IR GENERIC HISTORICAL  05/11/2016   IR US GUIDE VASC ACCESS LEFT 05/11/2016 Corrie Mckusick, DO [facility]  . IR GENERIC HISTORICAL  05/11/2016   IR US GUIDE VASC ACCESS RIGHT 05/11/2016 Corrie Mckusick, DO  [facility]  . IR GENERIC HISTORICAL  05/11/2016   IR RADIOLOGY PERIPHERAL GUIDED IV START 05/11/2016 Corrie Mckusick, DO [facility]INTER[facility]  . KIDNEY TRANSPLANT  2009  . LEFT HEART CATH AND CORS/GRAFTS ANGIOGRAPHY N/A 12/04/2017   Procedure: LEFT HEART CATH AND CORS/GRAFTS ANGIOGRAPHY;  Surgeon: Belva Crome, MD;  Location: [facility] CV LAB;  Service: Cardiovascular;  Laterality: N/A;  . NEPHRECTOMY  2017   transplant rejected   . PERIPHERAL VASCULAR CATHETERIZATION N/A 06/04/2016   Procedure: Upper Extremity Venography;  Surgeon: Waynetta Sandy, MD;  Location: [facility] CV LAB;  Service: Cardiovascular;  Laterality: N/A;  . PERIPHERAL VASCULAR INTERVENTION  04/13/2017   Procedure: PERIPHERAL VASCULAR INTERVENTION;  Surgeon: Conrad [place], MD;  Location: [facility] CV LAB;  Service: Cardiovascular;;  Lt. Common/Exernal  Iliac  . THROMBECTOMY AND REVISION OF ARTERIOVENTOUS (AV) GORETEX  GRAFT Left 10/08/2017   Procedure: THROMBECTOMY of ARTERIOVENTOUS (AV) GORETEX  GRAFT LEFT UPPER ARM;  Surgeon: Conrad [place], MD;  Location: [facility];  Service:  Vascular;  Laterality: Left;  . THROMBECTOMY W/ EMBOLECTOMY Left 09/14/2017   Procedure: THROMBECTOMY ARTERIOVENOUS GORE-TEX GRAFT LEFT UPPER ARM;  Surgeon: Rosetta Posner, MD;  Location: [facility];  Service: Vascular;  Laterality: Left;  . UPPER EXTREMITY VENOGRAPHY N/A 11/16/2017   Procedure: UPPER EXTREMITY VENOGRAPHY - Right Arm;  Surgeon: Conrad [place], MD;  Location: [facility] CV LAB;  Service: Cardiovascular;  Laterality: N/A;        Home Medications    Prior to Admission medications   Medication Sig Start Date End Date Taking? Authorizing Provider  acetaminophen (TYLENOL) 500 MG tablet Take 1,000 mg by mouth every 6 (six) hours as needed for moderate pain or headache.   Yes [provider]  albuterol (PROVENTIL HFA;VENTOLIN HFA) 108 (90 Base) MCG/ACT inhaler Inhale 1-2 puffs into the lungs every 6 (six) hours as needed for wheezing  or shortness of breath. 12/18/17  Yes Weaver, Scott T, PA-C  amLODipine (NORVASC) 5 MG tablet Take 1 tablet (5 mg total) by mouth daily. 11/27/17 05/11/18 Yes Richardson Dopp T, PA-C  aspirin EC 81 MG tablet Take 81 mg by mouth daily.   Yes [provider]  calcium acetate (PHOSLO) 667 MG capsule Take 1,334 mg by mouth daily at 12 noon.    Yes [provider]  clopidogrel (PLAVIX) 75 MG tablet Take 1 tablet (75 mg total) by mouth daily. 12/18/17  Yes Weaver, Scott T, PA-C  ezetimibe (ZETIA) 10 MG tablet Take 1 tablet (10 mg total) by mouth daily. 04/02/18 07/01/18 Yes Weaver, Scott T, PA-C  isosorbide mononitrate (IMDUR) 60 MG 24 hr tablet Take 1.5 tablets (90 mg total) by mouth daily. 04/06/18 07/05/18 Yes Weaver, Scott T, PA-C  repaglinide (PRANDIN) 0.5 MG tablet Take 1 tablet (0.5 mg total) by mouth 2 (two) times daily before a meal. 04/05/18  Yes Renato Shin, MD  traMADol (ULTRAM) 50 MG tablet Take 1 tablet (50 mg total) by mouth every 6 (six) hours as needed for moderate pain. 10/08/17  Yes Rhyne, Hulen Shouts, PA-C  predniSONE (DELTASONE) 50 MG tablet Take 12 hours prior to cath, 6 hours prior and 1 hour prior Patient not taking: Reported on 05/11/2018 11/27/17   Richardson Dopp T, PA-C  rosuvastatin (CRESTOR) 10 MG tablet Take 1 tablet (10 mg total) by mouth daily. Patient not taking: Reported on 05/11/2018 12/18/17 04/06/18  Liliane Shi, PA-C    Family History Family History  Problem Relation Age of Onset  . Hyperlipidemia Mother   . Hypertension Mother     Social History Social History   Tobacco Use  . Smoking status: Former Smoker    Types: Cigarettes    Last attempt to quit: 07/04/2014    Years since quitting: 3.8  . Smokeless tobacco: Never Used  . Tobacco comment: "smoked ~ 1 pack/month when I did smoke; never a steady smoker"  Substance Use Topics  . Alcohol use: Yes    Alcohol/week: 0.0 standard drinks    Comment: rare  "2 drinks, 1-2 times/year"  . Drug use: No      Allergies   Iodinated diagnostic agents; Lipitor [atorvastatin]; and Metoprolol   Review of Systems Review of Systems  Constitutional: Positive for fever. Negative for chills.  HENT: Negative for congestion, ear pain and sore throat.   Eyes: Negative for pain and visual disturbance.  Respiratory: Positive for cough. Negative for shortness of breath.   Cardiovascular: Negative for chest pain and palpitations.  Gastrointestinal: Negative for abdominal pain, diarrhea  and vomiting.  Genitourinary: Negative for dysuria and hematuria.  Musculoskeletal: Negative for arthralgias and back pain.  Skin: Negative for color change and rash.  Neurological: Negative for seizures, syncope and headaches.  All other systems reviewed and are negative.    Physical Exam Updated Vital Signs  ED Triage Vitals  Enc Vitals Group     BP 05/11/18 1656 99/66     Pulse Rate 05/11/18 1652 (!) 106     Resp 05/11/18 1652 (!) 22     Temp 05/11/18 1652 99.2 F (37.3 C)     Temp Source 05/11/18 1652 Oral     SpO2 05/11/18 1649 95 %     Weight 05/11/18 1655 228 lb (103.4 kg)     Height 05/11/18 1655 5\' 7"  (1.702 m)     Head Circumference --      Peak Flow --      Pain Score 05/11/18 1655 8     Pain Loc --      Pain Edu? --      Excl. in [place]? --     Physical Exam  Constitutional: He is oriented to person, place, and time. He appears well-developed and well-nourished.  HENT:  Head: Normocephalic and atraumatic.  Eyes: Pupils are equal, round, and reactive to light. Conjunctivae and EOM are normal.  Neck: Normal range of motion. Neck supple.  Cardiovascular: Normal rate, regular rhythm, normal heart sounds and intact distal pulses.  No murmur heard. Pulmonary/Chest: Effort normal and breath sounds normal. No respiratory distress.  Abdominal: Soft. Bowel sounds are normal. He exhibits no distension. There is no tenderness.  Musculoskeletal: Normal range of motion. He exhibits no edema.   Neurological: He is alert and oriented to person, place, and time. No cranial nerve deficit or sensory deficit. He exhibits normal muscle tone. Coordination normal.  Skin: Skin is warm and dry. Capillary refill takes less than 2 seconds.  Dialysis site well appearing  Psychiatric: He has a normal mood and affect.  Nursing note and vitals reviewed.    ED Treatments / Results  Labs (all labs ordered are listed, but only abnormal results are displayed) Labs Reviewed  COMPREHENSIVE METABOLIC PANEL - Abnormal; Notable for the following components:      Result Value   Sodium 134 (*)    Potassium 3.2 (*)    Glucose, Bld 158 (*)    BUN 25 (*)    Creatinine, Ser 9.83 (*)    Albumin 3.4 (*)    GFR calc non Af Amer 5 (*)    GFR calc Af Amer 6 (*)    All other components within normal limits  CBC WITH DIFFERENTIAL/PLATELET - Abnormal; Notable for the following components:   WBC 11.5 (*)    RBC 3.44 (*)    Hemoglobin 10.2 (*)    HCT 32.7 (*)    RDW 16.0 (*)    Neutro Abs 9.6 (*)    Abs Immature Granulocytes 0.14 (*)    All other components within normal limits  CULTURE, BLOOD (ROUTINE X 2)  CULTURE, BLOOD (ROUTINE X 2)  VANCOMYCIN, TROUGH  I-STAT CG4 LACTIC ACID, ED  I-STAT CG4 LACTIC ACID, ED    EKG EKG Interpretation  Date/Time:  Tuesday May 11 2018 17:15:47 EDT Ventricular Rate:  99 PR Interval:    QRS Duration: 97 QT Interval:  366 QTC Calculation: 470 R Axis:   104 Text Interpretation:  Sinus rhythm Right axis deviation Repol abnrm suggests ischemia, anterolateral Baseline wander in lead(s)  I II aVR aVF Confirmed by Lennice Sites [id number]) on 05/11/2018 5:23:02 PM   Radiology Dg Chest 2 View  Result Date: 05/11/2018 CLINICAL DATA:  Cough and fever. EXAM: CHEST - 2 VIEW COMPARISON:  Chest x-ray dated Dec 15, 2017. FINDINGS: Unchanged tunneled left internal jugular dialysis catheter with the tip in the right atrium. Stable cardiomegaly. Normal pulmonary vascularity.  New patchy airspace disease in the left upper and lower lobes. No pleural effusion or pneumothorax. No acute osseous abnormality. IMPRESSION: Patchy airspace disease in the left upper and lower lobes, suspicious for multifocal pneumonia given clinical history. Followup PA and lateral chest X-ray is recommended in 3-4 weeks following trial of antibiotic therapy to ensure resolution. Electronically Signed   By: Titus Dubin M.D.   On: 05/11/2018 18:34    Procedures .Critical Care Performed by: Lennice Sites, DO Authorized by: Lennice Sites, DO   Critical care provider statement:    Critical care time (minutes):  40   Critical care time was exclusive of:  Separately billable procedures and treating other patients and teaching time   Critical care was necessary to treat or prevent imminent or life-threatening deterioration of the following conditions:  Respiratory failure and sepsis   Critical care was time spent personally by me on the following activities:  Development of treatment plan with patient or surrogate, discussions with primary provider, examination of patient, ordering and performing treatments and interventions, ordering and review of laboratory studies, ordering and review of radiographic studies, pulse oximetry, re-evaluation of patient's condition, obtaining history from patient or surrogate and review of old charts   I assumed direction of critical care for this patient from another provider in my specialty: no     (including critical care time)  Medications Ordered in ED Medications  ceFEPIme (MAXIPIME) 2 g in sodium chloride 0.9 % 100 mL IVPB (2 g Intravenous New Bag/Given 05/11/18 1915)     Initial Impression / Assessment and Plan / ED Course  I have reviewed the triage vital signs and the nursing notes.  Pertinent labs & imaging results that were available during my care of the patient were reviewed by me and considered in my medical decision making (see chart for  details).     OLIVE MOTYKA is a 62 year old male with history of end-stage renal disease on hemodialysis, heart failure, CAD, hypertension who presents to the ED with fever.  Patient with low-grade fever upon arrival.  Tachycardia, tachypnea.  Had a fever of 102 at home.  Has had flulike symptoms for the last 2 days.  Has had cough but no sputum production.  Patient does not make any urine.  He denies any headaches, neck stiffness, abdominal pain.  Patient hypoxic to 88% on room air that improved with 2 L of oxygen.  Concern for sepsis.  Likely viral process versus pneumonia.  EKG was obtained that showed sinus rhythm.  No signs of ischemic changes.  No chest pain.  Pt with mild leukocytosis.  Chest x-ray showing multifocal pneumonia.  Lactic acid within normal limits.  Otherwise electrolytes at baseline.  Creatinine at baseline.  Blood cultures were collected.  Code sepsis was initiated following chest x-ray with signs of pneumonia and pt with tachycardia and hypoxia and resp rate of 25.  Fluids were held due to normal blood pressure, normal lactic acid and patient being an end-stage renal patient on hemodialysis with hypoxia and did not want to cause any further volume overload.  Patient received IV antibiotics with cefepime  and vancomycin.  Was admitted to the hospitalist service for further infectious work-up.  Hemodynamically stable throughout my care.  This chart was dictated using voice recognition software.  Despite best efforts to proofread,  errors can occur which can change the documentation meaning.   Final Clinical Impressions(s) / ED Diagnoses   Final diagnoses:  Sepsis, due to unspecified organism, unspecified whether acute organ dysfunction present [facility])  Multifocal pneumonia  Acute respiratory failure with hypoxia [facility])    ED Discharge Orders    None       Lennice Sites, DO 05/11/18 1944

## 2018-05-11 NOTE — H&P (Signed)
History and Physical    Patrick [name] CBS:[id number] DOB: 1956/04/12 DOA: 05/11/2018  PCP: Nolene Ebbs, MD Patient coming from: Home  Chief Complaint: Fever, generalized weakness, shortness of breath  HPI: Patrick [name] is a 62 y.o. male with medical history significant of CHF, coronary artery disease, ESRD on hemodialysis, hypertension, 2 diabetes presenting to the hospital with a 2 to 3-day history of fevers, generalized weakness, and shortness of breath at home.  States he had a fever of 100.4 F today.  He has also been having a nonproductive cough.  Denies having any chest pain.  States he felt very weak at dialysis today and has been having a low appetite.  Denies having any nausea or vomiting.  No recent sick contacts.  ED Course: Tachycardic and tachypneic on arrival.  Hypoxic and requiring 2 L supplemental oxygen via nasal cannula to maintain adequate oxygen saturations.  White count 11.5.  Lactic acid normal.  Chest x-ray showing new patchy airspace disease in the left upper and lower lobes suspicious for multifocal pneumonia.  Vanc and cefepime ordered in the ED.  TRH paged to admit.  Review of Systems: As per HPI otherwise 10 point review of systems negative.  Past Medical History:  Diagnosis Date  . Arthritis    "back" (09/24/2016)  . CHF (congestive heart failure) ([place])   . Coronary artery disease   . Dysrhythmia    aflutter s/p ablation 2018  . ESRD (end stage renal disease) on dialysis [facility])    "TTS; [place]" (09/24/2016)  . Gout   . Hernia of abdominal wall   . History of blood transfusion 2009- 2016   "I've had several; low HgB"  . History of kidney stones    treated with nephrectomy  . Hypertension   . Impotence of organic origin   . Migraine    "stopped in my 30's; they were related to high BP" (09/24/2016)  . Myocardial infarction [facility]) '96  . Pneumonia 01/2015   " a touch & I was in the hosp."  . Secondary hyperparathyroidism ([place])   . Type 2  diabetes mellitus (HCC)    no longer on medication since going on dialysis  . Wears glasses     Past Surgical History:  Procedure Laterality Date  . A-FLUTTER ABLATION N/A 09/24/2016   Procedure: A-Flutter Ablation;  Surgeon: Will Meredith Leeds, MD;  Location: [place] [facility];  Service: Cardiovascular;  Laterality: N/A;  . ABDOMINAL AORTOGRAM W/LOWER EXTREMITY N/A 04/13/2017   Procedure: ABDOMINAL AORTOGRAM W/LOWER EXTREMITY;  Surgeon: Conrad Warrenville, MD;  Location: [place] [facility];  Service: Cardiovascular;  Laterality: N/A;  Bilater lower extermity  . APPENDECTOMY    . AV FISTULA PLACEMENT Right 09/18/2014   Procedure: INSERTION OF ARTERIOVENOUS (AV) GORE-TEX GRAFT ARM USING 4-7MM  X 45CM STRETCH GORE-TEX VASCULAR GRAFT;  Surgeon: Rosetta Posner, MD;  Location: [place];  Service: Vascular;  Laterality: Right;  . AV FISTULA PLACEMENT Left 07/07/2016   Procedure: INSERTION OF LEFT BRACHIAL TO AXILLARY ARTERIOVENOUS (AV) GORE-TEX ARM GRAFT;  Surgeon: Conrad P[place]ump, MD;  Location: [place];  Service: Vascular;  Laterality: Left;  . CARDIAC CATHETERIZATION  ~ 2016  . COLONOSCOPY    . 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 EXCHANGE OF A DIALYSIS CATHETER Left 01/11/2018   Procedure: EXCHANGE OF TUNNELED DIALYSIS CATHETER;  Surgeon: Rosetta Posner, MD;  Location: [place];  Service: Vascular;  Laterality: Left;  . HERNIA REPAIR  2017   with nephrectomy  . INSERTION OF DIALYSIS CATHETER N/A 10/08/2017   Procedure: INSERTION OF TUNNELED DIALYSIS CATHETER;  Surgeon: Conrad Lindenhurst, MD;  Location: [place];  Service: Vascular;  Laterality: N/A;  . IR FLUORO GUIDE CV LINE LEFT  03/12/2018  . IR GENERIC HISTORICAL  05/11/2016   IR FLUORO GUIDE CV LINE LEFT 05/11/2016 Corrie Mckusick, DO [facility]  . IR GENERIC HISTORICAL  05/11/2016   IR US GUIDE VASC ACCESS LEFT 05/11/2016 Corrie Mckusick, DO [facility]  . IR GENERIC HISTORICAL   05/11/2016   IR US GUIDE VASC ACCESS RIGHT 05/11/2016 Corrie Mckusick, DO [facility]  . IR GENERIC HISTORICAL  05/11/2016   IR RADIOLOGY PERIPHERAL GUIDED IV START 05/11/2016 Corrie Mckusick, DO [facility]  . KIDNEY TRANSPLANT  2009  . LEFT HEART CATH AND CORS/GRAFTS ANGIOGRAPHY N/A 12/04/2017   Procedure: LEFT HEART CATH AND CORS/GRAFTS ANGIOGRAPHY;  Surgeon: Belva Crome, MD;  Location: [place] [facility];  Service: Cardiovascular;  Laterality: N/A;  . NEPHRECTOMY  2017   transplant rejected   . PERIPHERAL VASCULAR CATHETERIZATION N/A 06/04/2016   Procedure: Upper Extremity Venography;  Surgeon: Waynetta Sandy, MD;  Location: [place] [facility];  Service: Cardiovascular;  Laterality: N/A;  . PERIPHERAL VASCULAR INTERVENTION  04/13/2017   Procedure: PERIPHERAL VASCULAR INTERVENTION;  Surgeon: Conrad [place], MD;  Location: [place] [facility];  Service: Cardiovascular;;  Lt. Common/Exernal  Iliac  . THROMBECTOMY AND REVISION OF ARTERIOVENTOUS (AV) GORETEX  GRAFT Left 10/08/2017   Procedure: THROMBECTOMY of ARTERIOVENTOUS (AV) GORETEX  GRAFT LEFT UPPER ARM;  Surgeon: Conrad Hibbing, MD;  Location: [place];  Service: Vascular;  Laterality: Left;  . THROMBECTOMY W/ EMBOLECTOMY Left 09/14/2017   Procedure: THROMBECTOMY ARTERIOVENOUS GORE-TEX GRAFT LEFT UPPER ARM;  Surgeon: Rosetta Posner, MD;  Location: [place];  Service: Vascular;  Laterality: Left;  . UPPER EXTREMITY VENOGRAPHY N/A 11/16/2017   Procedure: UPPER EXTREMITY VENOGRAPHY - Right Arm;  Surgeon: Conrad [place], MD;  Location: [place] [facility];  Service: Cardiovascular;  Laterality: N/A;     reports that he quit smoking about 3 years ago. His smoking use included cigarettes. He has never used smokeless tobacco. He reports that he drinks alcohol. He reports that he does not use drugs.  Allergies  Allergen Reactions  . Iodinated Diagnostic Agents Swelling, Rash and Other (See Comments)    Other Reaction: burning to mouth, swelling of  lips. Other Reaction: burning to mouth, swelling of lips.  . Lipitor [Atorvastatin] Other (See Comments)    Leg pain  . Metoprolol Other (See Comments)    Headaches     Family History  Problem Relation Age of Onset  . Hyperlipidemia Mother   . Hypertension Mother   . HIV Sister     Prior to Admission medications   Medication Sig Start Date End Date Taking? Authorizing Provider  acetaminophen (TYLENOL) 500 MG tablet Take 1,000 mg by mouth every 6 (six) hours as needed for moderate pain or headache.   Yes [provider]  albuterol (PROVENTIL HFA;VENTOLIN HFA) 108 (90 Base) MCG/ACT inhaler Inhale 1-2 puffs into the lungs every 6 (six) hours as needed for wheezing or shortness of breath. 12/18/17  Yes Weaver, Scott T, PA-C  amLODipine (NORVASC) 5 MG tablet Take 1 tablet (5 mg total) by mouth daily. 11/27/17 05/11/18 Yes Richardson Dopp T, PA-C  aspirin EC 81 MG tablet  Take 81 mg by mouth daily.   Yes [provider]  calcium acetate (PHOSLO) 667 MG capsule Take 1,334 mg by mouth daily at 12 noon.    Yes [provider]  clopidogrel (PLAVIX) 75 MG tablet Take 1 tablet (75 mg total) by mouth daily. 12/18/17  Yes Weaver, Scott T, PA-C  ezetimibe (ZETIA) 10 MG tablet Take 1 tablet (10 mg total) by mouth daily. 04/02/18 07/01/18 Yes Weaver, Scott T, PA-C  isosorbide mononitrate (IMDUR) 60 MG 24 hr tablet Take 1.5 tablets (90 mg total) by mouth daily. 04/06/18 07/05/18 Yes Weaver, Scott T, PA-C  repaglinide (PRANDIN) 0.5 MG tablet Take 1 tablet (0.5 mg total) by mouth 2 (two) times daily before a meal. 04/05/18  Yes Renato Shin, MD  traMADol (ULTRAM) 50 MG tablet Take 1 tablet (50 mg total) by mouth every 6 (six) hours as needed for moderate pain. 10/08/17  Yes Rhyne, Hulen Shouts, PA-C  predniSONE (DELTASONE) 50 MG tablet Take 12 hours prior to cath, 6 hours prior and 1 hour prior Patient not taking: Reported on 05/11/2018 11/27/17   Richardson Dopp T, PA-C  rosuvastatin (CRESTOR) 10 MG  tablet Take 1 tablet (10 mg total) by mouth daily. Patient not taking: Reported on 05/11/2018 12/18/17 04/06/18  Richardson Dopp T, PA-C    Physical Exam: Vitals:   05/11/18 1745 05/11/18 1845 05/11/18 1915 05/11/18 2005  BP: (!) 101/52 (!) 108/57  115/75  Pulse: (!) 101 93  98  Resp: 20 (!) 21  18  Temp:    98.4 F (36.9 C)  TempSrc:    Oral  SpO2: 94% 90% 97% 98%  Weight:    103.4 kg  Height:       Physical Exam  Constitutional: He is oriented to person, place, and time. No distress.  HENT:  Head: Normocephalic and atraumatic.  Mouth/Throat: Oropharynx is clear and moist.  Eyes: Right eye exhibits no discharge. Left eye exhibits no discharge.  Neck: Neck supple. No tracheal deviation present.  Cardiovascular: Normal rate, regular rhythm and intact distal pulses.  Pulmonary/Chest: Effort normal. He has no wheezes.  Decreased breath sounds at the left upper lung area Bibasilar rales On 2 L supplemental oxygen via nasal cannula  Abdominal: Soft. Bowel sounds are normal. He exhibits no distension. There is no tenderness.  Musculoskeletal: He exhibits no edema.  Neurological: He is alert and oriented to person, place, and time.  Skin: Skin is warm and dry. He is not diaphoretic.  Psychiatric: His behavior is normal.     Labs on Admission: I have personally reviewed following labs and imaging studies  CBC: Recent Labs  Lab 05/11/18 1735  WBC 11.5*  NEUTROABS 9.6*  HGB 10.2*  HCT 32.7*  MCV 95.1  PLT 027   Basic Metabolic Panel: Recent Labs  Lab 05/11/18 1735  NA 134*  K 3.2*  CL 99  CO2 22  GLUCOSE 158*  BUN 25*  CREATININE 9.83*  CALCIUM 10.2   GFR: Estimated Creatinine Clearance: 8.8 mL/min (A) (by C-G formula based on SCr of 9.83 mg/dL (H)). Liver Function Tests: Recent Labs  Lab 05/11/18 1735  AST 34  ALT 12  ALKPHOS 53  BILITOT 1.0  PROT 7.3  ALBUMIN 3.4*   No results for input(s): LIPASE, AMYLASE in the last 168 hours. No results for  input(s): AMMONIA in the last 168 hours. Coagulation Profile: No results for input(s): INR, PROTIME in the last 168 hours. Cardiac Enzymes: No results for input(s): CKTOTAL, CKMB,  CKMBINDEX, TROPONINI in the last 168 hours. BNP (last 3 results) No results for input(s): PROBNP in the last 8760 hours. HbA1C: No results for input(s): HGBA1C in the last 72 hours. CBG: No results for input(s): GLUCAP in the last 168 hours. Lipid Profile: No results for input(s): CHOL, HDL, LDLCALC, TRIG, CHOLHDL, LDLDIRECT in the last 72 hours. Thyroid Function Tests: No results for input(s): TSH, T4TOTAL, FREET4, T3FREE, THYROIDAB in the last 72 hours. Anemia Panel: No results for input(s): VITAMINB12, FOLATE, FERRITIN, TIBC, IRON, RETICCTPCT in the last 72 hours. Urine analysis:    Component Value Date/Time   COLORURINE RED (A) 11/17/2015 0910   APPEARANCEUR TURBID (A) 11/17/2015 0910   LABSPEC 1.035 (H) 11/17/2015 0910   PHURINE 6.0 11/17/2015 0910   GLUCOSEU 100 (A) 11/17/2015 0910   HGBUR LARGE (A) 11/17/2015 0910   BILIRUBINUR LARGE (A) 11/17/2015 0910   KETONESUR 40 (A) 11/17/2015 0910   PROTEINUR >300 (A) 11/17/2015 0910   UROBILINOGEN 0.2 11/24/2013 0242   NITRITE POSITIVE (A) 11/17/2015 0910   LEUKOCYTESUR LARGE (A) 11/17/2015 0910    Radiological Exams on Admission: Dg Chest 2 View  Result Date: 05/11/2018 CLINICAL DATA:  Cough and fever. EXAM: CHEST - 2 VIEW COMPARISON:  Chest x-ray dated Dec 15, 2017. FINDINGS: Unchanged tunneled left internal jugular dialysis catheter with the tip in the right atrium. Stable cardiomegaly. Normal pulmonary vascularity. New patchy airspace disease in the left upper and lower lobes. No pleural effusion or pneumothorax. No acute osseous abnormality. IMPRESSION: Patchy airspace disease in the left upper and lower lobes, suspicious for multifocal pneumonia given clinical history. Followup PA and lateral chest X-ray is recommended in 3-4 weeks following trial  of antibiotic therapy to ensure resolution. Electronically Signed   By: Titus Dubin M.D.   On: 05/11/2018 18:34    EKG: Independently reviewed.  Sinus rhythm, baseline wander.  Nonspecific T wave abnormality; similar to prior tracing.  Assessment/Plan Principal Problem:   Multifocal pneumonia Active Problems:   Essential hypertension   ESRD (end stage renal disease) (HCC)   CAD (coronary artery disease)   Chronic anemia   Hypokalemia   Acute combined systolic and diastolic congestive heart failure (HCC)   Hyperlipidemia   Physical deconditioning   Multifocal pneumonia Afebrile here and BP stable.  Tachycardic and tachypneic on arrival; now resolved. Hypoxic and requiring 2 L supplemental oxygen via nasal cannula to maintain adequate oxygen saturations. White count 11.5.  Lactic acid normal.  Chest x-ray showing new patchy airspace disease in the left upper and lower lobes suspicious for multifocal pneumonia.   -Monitor on telemetry -Received vanc and cefepime in the ED.  Consider de-escalating antibiotics if patient improves tomorrow. -IV fluids  -Tylenol PRN -Blood culture x2 pending -Flu panel -CBC in a.m. -Supplemental oxygen -Continuous pulse ox  Chronic anemia Hemoglobin 10.2; at baseline. -Continue monitor  Mild hypokalemia Potassium 3.2. -Replete orally and continue to monitor -Check magnesium -BMP in a.m.  End-stage renal disease on hemodialysis Tuesday Thursday Saturday -Spoke to nephrology (Dr. Moshe Cipro).  Patient will be seen in the morning. -Currently not volume overloaded. -Continue home renal meds  Hyperlipidemia Continue home Zetia  Hypertension Normotensive. -Hold home Imdur at this time.  CAD -Stable.  Continue home aspirin and Plavix.  Hold home Imdur at this time.  Combined systolic and diastolic CHF -Stable.  Currently not volume overloaded on exam.  Deconditioning -PT consult   DVT prophylaxis: Subcutaneous heparin Code  Status: Patient wishes to be full code. Family Communication:  Daughter and wife at bedside updated. Disposition Plan: Anticipate discharge in 1 to 2 days to home. Consults called: Nephrology (Dr. Moshe Cipro) Admission status: Inpatient It is my clinical opinion that admission to INPATIENT is reasonable and necessary in this 62 y.o. male . presenting with symptoms of fever, cough, generalized weakness, shortness of breath, concerning for multifocal pneumonia . with pertinent positives on physical exam including: Hypoxia . and pertinent positives on radiographic and laboratory data including: Evidence of pneumonia on chest x-ray. . Workup and treatment include IV fluids, IV antibiotics  Given the aforementioned, the predictability of an adverse outcome is felt to be significant. I expect that the patient will require at least 2 midnights in the hospital to treat this condition.    Shela Leff MD [facility] Pager [phone]  If 7PM-7AM, please contact night-coverage [email or web address] Password [email or web address]  05/12/2018, 1:56 AM

## 2018-05-11 NOTE — ED Notes (Signed)
Pt states he took tylenol at home today before coming to hospital.

## 2018-05-11 NOTE — ED Triage Notes (Signed)
Pt arrives to ED from home with complaints of fever and chills since two days ago along with n/v/d. EMS reports pt went to dialysis today. After dialysis he received antibiotics and was told he needs a chest xray. Pt went home and took a nap. When he woke up he felt febrile, hot flashes, and chills. Pt placed in position of comfort with bed locked and lowered, call bell in reach.

## 2018-05-12 DIAGNOSIS — E785 Hyperlipidemia, unspecified: Secondary | ICD-10-CM

## 2018-05-12 DIAGNOSIS — D649 Anemia, unspecified: Secondary | ICD-10-CM

## 2018-05-12 DIAGNOSIS — R5381 Other malaise: Secondary | ICD-10-CM

## 2018-05-12 DIAGNOSIS — N186 End stage renal disease: Secondary | ICD-10-CM

## 2018-05-12 DIAGNOSIS — E876 Hypokalemia: Secondary | ICD-10-CM

## 2018-05-12 DIAGNOSIS — I5041 Acute combined systolic (congestive) and diastolic (congestive) heart failure: Secondary | ICD-10-CM

## 2018-05-12 DIAGNOSIS — J9601 Acute respiratory failure with hypoxia: Secondary | ICD-10-CM

## 2018-05-12 LAB — CBC
HEMATOCRIT: 33.8 % — AB (ref 39.0–52.0)
Hemoglobin: 10.2 g/dL — ABNORMAL LOW (ref 13.0–17.0)
MCH: 29 pg (ref 26.0–34.0)
MCHC: 30.2 g/dL (ref 30.0–36.0)
MCV: 96 fL (ref 80.0–100.0)
Platelets: 158 10*3/uL (ref 150–400)
RBC: 3.52 MIL/uL — ABNORMAL LOW (ref 4.22–5.81)
RDW: 16.3 % — AB (ref 11.5–15.5)
WBC: 13.1 10*3/uL — ABNORMAL HIGH (ref 4.0–10.5)
nRBC: 0 % (ref 0.0–0.2)

## 2018-05-12 LAB — MRSA PCR SCREENING: MRSA by PCR: POSITIVE — AB

## 2018-05-12 LAB — GLUCOSE, CAPILLARY
GLUCOSE-CAPILLARY: 109 mg/dL — AB (ref 70–99)
GLUCOSE-CAPILLARY: 120 mg/dL — AB (ref 70–99)
GLUCOSE-CAPILLARY: 118 mg/dL — AB (ref 70–99)
GLUCOSE-CAPILLARY: 154 mg/dL — AB (ref 70–99)

## 2018-05-12 LAB — INFLUENZA PANEL BY PCR (TYPE A & B)
Influenza A By PCR: NEGATIVE
Influenza B By PCR: NEGATIVE

## 2018-05-12 LAB — MAGNESIUM: Magnesium: 1.9 mg/dL (ref 1.7–2.4)

## 2018-05-12 LAB — HIV ANTIBODY (ROUTINE TESTING W REFLEX): HIV SCREEN 4TH GENERATION: NONREACTIVE

## 2018-05-12 MED ORDER — MUPIROCIN 2 % EX OINT
1.0000 "application " | TOPICAL_OINTMENT | Freq: Two times a day (BID) | CUTANEOUS | Status: AC
  Administered 2018-05-12 – 2018-05-16 (×10): 1 via NASAL
  Filled 2018-05-12 (×2): qty 22

## 2018-05-12 MED ORDER — ALBUTEROL SULFATE (2.5 MG/3ML) 0.083% IN NEBU: 2.5000 mg | INHALATION_SOLUTION | Freq: Four times a day (QID) | RESPIRATORY_TRACT | Status: DC | PRN

## 2018-05-12 MED ORDER — CALCIUM ACETATE (PHOS BINDER) 667 MG PO CAPS
1334.0000 mg | ORAL_CAPSULE | Freq: Every day | ORAL | Status: DC
  Administered 2018-05-12 – 2018-05-16 (×4): 1334 mg via ORAL
  Filled 2018-05-12 (×4): qty 2

## 2018-05-12 MED ORDER — SODIUM CHLORIDE 0.9 % IV SOLN
INTRAVENOUS | Status: AC
  Administered 2018-05-12: 03:00:00 via INTRAVENOUS

## 2018-05-12 MED ORDER — CLOPIDOGREL BISULFATE 75 MG PO TABS
75.0000 mg | ORAL_TABLET | Freq: Every day | ORAL | Status: DC
  Administered 2018-05-12 – 2018-05-18 (×7): 75 mg via ORAL
  Filled 2018-05-12 (×7): qty 1

## 2018-05-12 MED ORDER — EZETIMIBE 10 MG PO TABS
10.0000 mg | ORAL_TABLET | Freq: Every day | ORAL | Status: DC
  Administered 2018-05-12 – 2018-05-18 (×7): 10 mg via ORAL
  Filled 2018-05-12 (×7): qty 1

## 2018-05-12 MED ORDER — ACETAMINOPHEN 325 MG PO TABS
650.0000 mg | ORAL_TABLET | Freq: Four times a day (QID) | ORAL | Status: DC | PRN
  Administered 2018-05-12 – 2018-05-14 (×3): 650 mg via ORAL
  Filled 2018-05-12 (×3): qty 2

## 2018-05-12 MED ORDER — CHLORHEXIDINE GLUCONATE CLOTH 2 % EX PADS
6.0000 | MEDICATED_PAD | Freq: Every day | CUTANEOUS | Status: DC
  Administered 2018-05-13 – 2018-05-14 (×2): 6 via TOPICAL

## 2018-05-12 MED ORDER — CHLORHEXIDINE GLUCONATE CLOTH 2 % EX PADS
6.0000 | MEDICATED_PAD | Freq: Every day | CUTANEOUS | Status: DC
  Administered 2018-05-12 – 2018-05-18 (×5): 6 via TOPICAL

## 2018-05-12 MED ORDER — POTASSIUM CHLORIDE CRYS ER 20 MEQ PO TBCR
60.0000 meq | EXTENDED_RELEASE_TABLET | Freq: Once | ORAL | Status: AC
  Administered 2018-05-12: 60 meq via ORAL
  Filled 2018-05-12: qty 3

## 2018-05-12 MED ORDER — ASPIRIN EC 81 MG PO TBEC
81.0000 mg | DELAYED_RELEASE_TABLET | Freq: Every day | ORAL | Status: DC
  Administered 2018-05-12 – 2018-05-18 (×7): 81 mg via ORAL
  Filled 2018-05-12 (×7): qty 1

## 2018-05-12 MED ORDER — GUAIFENESIN ER 600 MG PO TB12
600.0000 mg | ORAL_TABLET | Freq: Two times a day (BID) | ORAL | Status: DC
  Administered 2018-05-12 – 2018-05-18 (×13): 600 mg via ORAL
  Filled 2018-05-12 (×13): qty 1

## 2018-05-12 MED ORDER — HEPARIN SODIUM (PORCINE) 5000 UNIT/ML IJ SOLN
5000.0000 [IU] | Freq: Three times a day (TID) | INTRAMUSCULAR | Status: DC
  Administered 2018-05-12 – 2018-05-17 (×15): 5000 [IU] via SUBCUTANEOUS
  Filled 2018-05-12 (×13): qty 1

## 2018-05-12 MED ORDER — GUAIFENESIN 100 MG/5ML PO SOLN
5.0000 mL | ORAL | Status: DC | PRN
  Administered 2018-05-13 – 2018-05-17 (×5): 100 mg via ORAL
  Filled 2018-05-12 (×5): qty 5

## 2018-05-12 MED ORDER — ORAL CARE MOUTH RINSE
15.0000 mL | Freq: Two times a day (BID) | OROMUCOSAL | Status: DC
  Administered 2018-05-12 – 2018-05-13 (×2): 15 mL via OROMUCOSAL

## 2018-05-12 MED ORDER — ALBUTEROL SULFATE HFA 108 (90 BASE) MCG/ACT IN AERS: 1.0000 | INHALATION_SPRAY | Freq: Four times a day (QID) | RESPIRATORY_TRACT | Status: DC | PRN

## 2018-05-12 MED ORDER — INSULIN ASPART 100 UNIT/ML ~~LOC~~ SOLN
0.0000 [IU] | Freq: Three times a day (TID) | SUBCUTANEOUS | Status: DC
  Administered 2018-05-13: 2 [IU] via SUBCUTANEOUS
  Administered 2018-05-14: 1 [IU] via SUBCUTANEOUS
  Administered 2018-05-14: 2 [IU] via SUBCUTANEOUS
  Administered 2018-05-15 – 2018-05-16 (×3): 1 [IU] via SUBCUTANEOUS
  Administered 2018-05-17: 2 [IU] via SUBCUTANEOUS
  Administered 2018-05-17: 1 [IU] via SUBCUTANEOUS

## 2018-05-12 NOTE — Consult Note (Signed)
Renal Service Consult Note [place] 05/12/2018 [name] Requesting Physician:  Dr Maryland Pink, G  Reason for Consult:  ESRD pt w/ PNA HPI: The patient is a 62 y.o. year-old presenting w/ fevers, gen'd weak and SOB at home.  NOnprod cough.  No CP.  CXR showed new patchy infiltrates L upper and lower lobes.  Admitted and started on IV abx.    Had HD yest w/ 2.1 kg on.  Has been cramping and the dry wt was raised up and cramping got better.  No nausea,vomiting, no swelling in legs.  ESRD was started on HD 2000 , had transplant 2009 and went back on HD in 2016.  ESRD was after open heart surgery his BP sent sky high and he had to take a lot of BP medications and they ate up his kidneys.  Has TDC , they have to use clotbuster all the time. Says he ran out of options for HD access in his arms.  No abd pain.     ROS  denies CP  no joint pain   no HA  no blurry vision  no rash  no diarrhea  no nausea/ vomiting  no dysuria  no difficulty voiding  no change in urine color    Past Medical History  Past Medical History:  Diagnosis Date  . Arthritis    "back" (09/24/2016)  . CHF (congestive heart failure) ([place])   . Coronary artery disease   . Dysrhythmia    aflutter s/p ablation 2018  . ESRD (end stage renal disease) on dialysis [facility])    "TTS; [place]" (09/24/2016)  . Gout   . Hernia of abdominal wall   . History of blood transfusion 2009- 2016   "I've had several; low HgB"  . History of kidney stones    treated with nephrectomy  . Hypertension   . Impotence of organic origin   . Migraine    "stopped in my 30's; they were related to high BP" (09/24/2016)  . Myocardial infarction [facility]) '96  . Pneumonia 01/2015   " a touch & I was in the hosp."  . Secondary hyperparathyroidism ([place])   . Type 2 diabetes mellitus (HCC)    no longer on medication since going on dialysis  . Wears glasses    Past Surgical History  Past Surgical History:   Procedure Laterality Date  . A-FLUTTER ABLATION N/A 09/24/2016   Procedure: A-Flutter Ablation;  Surgeon: Will Meredith Leeds, MD;  Location: [place] [facility];  Service: Cardiovascular;  Laterality: N/A;  . ABDOMINAL AORTOGRAM W/LOWER EXTREMITY N/A 04/13/2017   Procedure: ABDOMINAL AORTOGRAM W/LOWER EXTREMITY;  Surgeon: Conrad Penuelas, MD;  Location: [place] [facility];  Service: Cardiovascular;  Laterality: N/A;  Bilater lower extermity  . APPENDECTOMY    . AV FISTULA PLACEMENT Right 09/18/2014   Procedure: INSERTION OF ARTERIOVENOUS (AV) GORE-TEX GRAFT ARM USING 4-7MM  X 45CM STRETCH GORE-TEX VASCULAR GRAFT;  Surgeon: Rosetta Posner, MD;  Location: [place];  Service: Vascular;  Laterality: Right;  . AV FISTULA PLACEMENT Left 07/07/2016   Procedure: INSERTION OF LEFT BRACHIAL TO AXILLARY ARTERIOVENOUS (AV) GORE-TEX ARM GRAFT;  Surgeon: Conrad Yardville, MD;  Location: [place];  Service: Vascular;  Laterality: Left;  . CARDIAC CATHETERIZATION  ~ 2016  . COLONOSCOPY    . 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 EXCHANGE OF A DIALYSIS CATHETER  Left 01/11/2018   Procedure: EXCHANGE OF TUNNELED DIALYSIS CATHETER;  Surgeon: Rosetta Posner, MD;  Location: [place];  Service: Vascular;  Laterality: Left;  . HERNIA REPAIR  2017   with nephrectomy  . INSERTION OF DIALYSIS CATHETER N/A 10/08/2017   Procedure: INSERTION OF TUNNELED DIALYSIS CATHETER;  Surgeon: Conrad Easley, MD;  Location: [place];  Service: Vascular;  Laterality: N/A;  . IR FLUORO GUIDE CV LINE LEFT  03/12/2018  . IR GENERIC HISTORICAL  05/11/2016   IR FLUORO GUIDE CV LINE LEFT 05/11/2016 Corrie Mckusick, DO [facility]  . IR GENERIC HISTORICAL  05/11/2016   IR US GUIDE VASC ACCESS LEFT 05/11/2016 Corrie Mckusick, DO [facility]  . IR GENERIC HISTORICAL  05/11/2016   IR US GUIDE VASC ACCESS RIGHT 05/11/2016 Corrie Mckusick, DO [facility]  . IR GENERIC HISTORICAL  05/11/2016    IR RADIOLOGY PERIPHERAL GUIDED IV START 05/11/2016 Corrie Mckusick, DO [facility]  . KIDNEY TRANSPLANT  2009  . LEFT HEART CATH AND CORS/GRAFTS ANGIOGRAPHY N/A 12/04/2017   Procedure: LEFT HEART CATH AND CORS/GRAFTS ANGIOGRAPHY;  Surgeon: Belva Crome, MD;  Location: [place] [facility];  Service: Cardiovascular;  Laterality: N/A;  . NEPHRECTOMY  2017   transplant rejected   . PERIPHERAL VASCULAR CATHETERIZATION N/A 06/04/2016   Procedure: Upper Extremity Venography;  Surgeon: Waynetta Sandy, MD;  Location: [place] [facility];  Service: Cardiovascular;  Laterality: N/A;  . PERIPHERAL VASCULAR INTERVENTION  04/13/2017   Procedure: PERIPHERAL VASCULAR INTERVENTION;  Surgeon: Conrad Strasburg, MD;  Location: [place] [facility];  Service: Cardiovascular;;  Lt. Common/Exernal  Iliac  . THROMBECTOMY AND REVISION OF ARTERIOVENTOUS (AV) GORETEX  GRAFT Left 10/08/2017   Procedure: THROMBECTOMY of ARTERIOVENTOUS (AV) GORETEX  GRAFT LEFT UPPER ARM;  Surgeon: Conrad [place], MD;  Location: [place];  Service: Vascular;  Laterality: Left;  . THROMBECTOMY W/ EMBOLECTOMY Left 09/14/2017   Procedure: THROMBECTOMY ARTERIOVENOUS GORE-TEX GRAFT LEFT UPPER ARM;  Surgeon: Rosetta Posner, MD;  Location: [place];  Service: Vascular;  Laterality: Left;  . UPPER EXTREMITY VENOGRAPHY N/A 11/16/2017   Procedure: UPPER EXTREMITY VENOGRAPHY - Right Arm;  Surgeon: Conrad Forreston, MD;  Location: [place] [facility];  Service: Cardiovascular;  Laterality: N/A;   Family History  Family History  Problem Relation Age of Onset  . Hyperlipidemia Mother   . Hypertension Mother   . HIV Sister    Social History  reports that he quit smoking about 3 years ago. His smoking use included cigarettes. He has never used smokeless tobacco. He reports that he drinks alcohol. He reports that he does not use drugs. Allergies  Allergies  Allergen Reactions  . Iodinated Diagnostic Agents Swelling, Rash and Other (See Comments)    Other Reaction:  burning to mouth, swelling of lips. Other Reaction: burning to mouth, swelling of lips.  . Lipitor [Atorvastatin] Other (See Comments)    Leg pain  . Metoprolol Other (See Comments)    Headaches    Home medications Prior to Admission medications   Medication Sig Start Date End Date Taking? Authorizing Provider  acetaminophen (TYLENOL) 500 MG tablet Take 1,000 mg by mouth every 6 (six) hours as needed for moderate pain or headache.   Yes [provider]  albuterol (PROVENTIL HFA;VENTOLIN HFA) 108 (90 Base) MCG/ACT inhaler Inhale 1-2 puffs into the lungs every 6 (six) hours as needed for wheezing or shortness of breath. 12/18/17  Yes Weaver, Scott T, PA-C  amLODipine (  NORVASC) 5 MG tablet Take 1 tablet (5 mg total) by mouth daily. 11/27/17 05/11/18 Yes Richardson Dopp T, PA-C  aspirin EC 81 MG tablet Take 81 mg by mouth daily.   Yes [provider]  calcium acetate (PHOSLO) 667 MG capsule Take 1,334 mg by mouth daily at 12 noon.    Yes [provider]  clopidogrel (PLAVIX) 75 MG tablet Take 1 tablet (75 mg total) by mouth daily. 12/18/17  Yes Weaver, Scott T, PA-C  ezetimibe (ZETIA) 10 MG tablet Take 1 tablet (10 mg total) by mouth daily. 04/02/18 07/01/18 Yes Weaver, Scott T, PA-C  isosorbide mononitrate (IMDUR) 60 MG 24 hr tablet Take 1.5 tablets (90 mg total) by mouth daily. 04/06/18 07/05/18 Yes Weaver, Scott T, PA-C  repaglinide (PRANDIN) 0.5 MG tablet Take 1 tablet (0.5 mg total) by mouth 2 (two) times daily before a meal. 04/05/18  Yes Renato Shin, MD  traMADol (ULTRAM) 50 MG tablet Take 1 tablet (50 mg total) by mouth every 6 (six) hours as needed for moderate pain. 10/08/17  Yes Rhyne, Hulen Shouts, PA-C  predniSONE (DELTASONE) 50 MG tablet Take 12 hours prior to cath, 6 hours prior and 1 hour prior Patient not taking: Reported on 05/11/2018 11/27/17   Richardson Dopp T, PA-C  rosuvastatin (CRESTOR) 10 MG tablet Take 1 tablet (10 mg total) by mouth daily. Patient not taking:  Reported on 05/11/2018 12/18/17 04/06/18  Liliane Shi, PA-C   Liver Function Tests Recent Labs  Lab 05/11/18 1735  AST 34  ALT 12  ALKPHOS 53  BILITOT 1.0  PROT 7.3  ALBUMIN 3.4*   No results for input(s): LIPASE, AMYLASE in the last 168 hours. CBC Recent Labs  Lab 05/11/18 1735 05/12/18 0643  WBC 11.5* 13.1*  NEUTROABS 9.6*  --   HGB 10.2* 10.2*  HCT 32.7* 33.8*  MCV 95.1 96.0  PLT 174 081   Basic Metabolic Panel Recent Labs  Lab 05/11/18 1735  NA 134*  K 3.2*  CL 99  CO2 22  GLUCOSE 158*  BUN 25*  CREATININE 9.83*  CALCIUM 10.2   Iron/TIBC/Ferritin/ %Sat    Component Value Date/Time   IRON 29 (L) 01/24/2016 1435   TIBC 161 (L) 01/24/2016 1435   FERRITIN 1,048 (H) 09/15/2014 1621   IRONPCTSAT 18 01/24/2016 1435    Vitals:   05/11/18 1845 05/11/18 1915 05/11/18 2005 05/12/18 0454  BP: (!) 108/57  115/75 116/71  Pulse: 93  98 94  Resp: (!) 21  18 (!) 22  Temp:   98.4 F (36.9 C) 98.5 F (36.9 C)  TempSrc:   Oral Oral  SpO2: 90% 97% 98% 94%  Weight:   103.4 kg   Height:       Exam Gen alert no distress No rash, cyanosis or gangrene Sclera anicteric, throat clear  No jvd or bruits Chest bilat wheezing, no rales RRR no MRG  Abd soft ntnd no mass or ascites +bs  GU normal male MS no joint effusions or deformity Ext no LE edema, no wounds or ulcers Neuro is alert, Ox 3 , nf L IJ TDC   Dialysis: [facility]  [place]=  4h   2/3.5 bath  104.5kg   Hep 3000   L TDC  - mircera 75 last 10/15       Impression/ Plan: 1. PNA - fevers / cough/ L sided infiltrates.  2. ESRD - had HD yest, stable volume . HD tomorrow 1st shift  3. HTN - cont meds  4. Anemia ckd - had esa yest, observe 5. CAD sp CABG'96     Kelly Splinter MD [facility] pager [phone]   05/12/2018, 9:26 AM

## 2018-05-12 NOTE — Progress Notes (Addendum)
[facility] PROGRESS NOTE  CHANDLAR GUICE XBD:[id number] DOB: 1955/08/09 DOA: 05/11/2018  PCP: Nolene Ebbs, MD  Brief History/Interval Summary: 62 y.o. male with medical history significant of CHF, coronary artery disease, ESRD on hemodialysis, hypertension, 2 diabetes presented to the hospital with a 2 to 3-day history of fevers, generalized weakness, and shortness of breath at home.  He has also been having a nonproductive cough.    Patient was noted to be hypoxic requiring 2 L of oxygen.  WBC was noted to be elevated.  Chest x-ray showed airspace disease in the left lung.  Concern was for healthcare associated pneumonia.  Patient was hospitalized and placed on broad-spectrum antibiotics.  Reason for Visit: HCAP  Consultants: Nephrology  Procedures: None yet  Antibiotics: Vancomycin and cefepime  Subjective/Interval History: Patient continues to have a cough which is dry.  Denies any chest pain.  Some shortness of breath.  No nausea vomiting.  ROS: Denies any headaches.  Objective:  Vital Signs  Vitals:   05/11/18 1845 05/11/18 1915 05/11/18 2005 05/12/18 0454  BP: (!) 108/57  115/75 116/71  Pulse: 93  98 94  Resp: (!) 21  18 (!) 22  Temp:   98.4 F (36.9 C) 98.5 F (36.9 C)  TempSrc:   Oral Oral  SpO2: 90% 97% 98% 94%  Weight:   103.4 kg   Height:        Intake/Output Summary (Last 24 hours) at 05/12/2018 1055 Last data filed at 05/12/2018 0600 Gross per 24 hour  Intake 460 ml  Output 0 ml  Net 460 ml   Filed Weights   05/11/18 1655 05/11/18 2005  Weight: 103.4 kg 103.4 kg    General appearance: alert, cooperative, appears stated age and no distress Head: Normocephalic, without obvious abnormality, atraumatic Resp: Mildly tachypneic at rest.  Coarse breath sounds bilaterally.  Few crackles in the left lung.  No wheezing or rhonchi. Cardio: regular rate and rhythm, S1, S2 normal, no murmur, click, rub or gallop GI: soft, non-tender; bowel sounds  normal; no masses,  no organomegaly Extremities: extremities normal, atraumatic, no cyanosis or edema Neurologic: Alert and oriented x3.  No focal neurological deficits.  Lab Results:  Data Reviewed: I have personally reviewed following labs and imaging studies  CBC: Recent Labs  Lab 05/11/18 1735 05/12/18 0643  WBC 11.5* 13.1*  NEUTROABS 9.6*  --   HGB 10.2* 10.2*  HCT 32.7* 33.8*  MCV 95.1 96.0  PLT 174 834    Basic Metabolic Panel: Recent Labs  Lab 05/11/18 1735 05/12/18 0643  NA 134*  --   K 3.2*  --   CL 99  --   CO2 22  --   GLUCOSE 158*  --   BUN 25*  --   CREATININE 9.83*  --   CALCIUM 10.2  --   MG  --  1.9    GFR: Estimated Creatinine Clearance: 8.8 mL/min (A) (by C-G formula based on SCr of 9.83 mg/dL (H)).  Liver Function Tests: Recent Labs  Lab 05/11/18 1735  AST 34  ALT 12  ALKPHOS 53  BILITOT 1.0  PROT 7.3  ALBUMIN 3.4*    CBG: Recent Labs  Lab 05/12/18 0820  GLUCAP 109*     Recent Results (from the past 240 hour(s))  Blood Culture (routine x 2)     Status: None (Preliminary result)   Collection Time: 05/11/18  5:13 PM  Result Value Ref Range Status   Specimen Description BLOOD LEFT HAND  Final   Special Requests   Final    BOTTLES DRAWN AEROBIC AND ANAEROBIC Blood Culture adequate volume   Culture   Final    NO GROWTH < 24 HOURS Performed at [facility], [address] [place], [place] [address]    Report Status PENDING  Incomplete  Blood Culture (routine x 2)     Status: None (Preliminary result)   Collection Time: 05/11/18  5:50 PM  Result Value Ref Range Status   Specimen Description BLOOD RIGHT HAND  Final   Special Requests   Final    BOTTLES DRAWN AEROBIC AND ANAEROBIC Blood Culture adequate volume   Culture   Final    NO GROWTH < 24 HOURS Performed at [facility], [place] [address], [place], [place] [address]    Report Status PENDING  Incomplete  MRSA PCR Screening     Status: Abnormal   Collection  Time: 05/12/18  3:22 AM  Result Value Ref Range Status   MRSA by PCR POSITIVE (A) NEGATIVE Final    Comment:        The GeneXpert MRSA Assay (FDA approved for NASAL specimens only), is one component of a comprehensive MRSA colonization surveillance program. It is not intended to diagnose MRSA infection nor to guide or monitor treatment for MRSA infections. RESULT CALLED TO, READ BACK BY AND VERIFIED WITH: A MINTZ RN [phone] 05/12/18 A BROWNING Performed at [facility], [place] [address] [place], [place] [address]       Radiology Studies: Dg Chest 2 View  Result Date: 05/11/2018 CLINICAL DATA:  Cough and fever. EXAM: CHEST - 2 VIEW COMPARISON:  Chest x-ray dated Dec 15, 2017. FINDINGS: Unchanged tunneled left internal jugular dialysis catheter with the tip in the right atrium. Stable cardiomegaly. Normal pulmonary vascularity. New patchy airspace disease in the left upper and lower lobes. No pleural effusion or pneumothorax. No acute osseous abnormality. IMPRESSION: Patchy airspace disease in the left upper and lower lobes, suspicious for multifocal pneumonia given clinical history. Followup PA and lateral chest X-ray is recommended in 3-4 weeks following trial of antibiotic therapy to ensure resolution. Electronically Signed   By: Titus Dubin M.D.   On: 05/11/2018 18:34     Medications:  Scheduled: . aspirin EC  81 mg Oral Daily  . calcium acetate  1,334 mg Oral Q breakfast  . Chlorhexidine Gluconate Cloth  6 each Topical Q0600  . clopidogrel  75 mg Oral Daily  . ezetimibe  10 mg Oral Daily  . heparin  5,000 Units Subcutaneous Q8H  . insulin aspart  0-9 Units Subcutaneous TID WC  . mouth rinse  15 mL Mouth Rinse BID   Continuous: . sodium chloride 100 mL/hr at 05/12/18 0315  . [START ON 05/13/2018] ceFEPime (MAXIPIME) IV    . [START ON 05/13/2018] vancomycin     KWI:[id number], albuterol  Assessment/Plan:    Multifocal pneumonia possibly healthcare  associated/acute respiratory failure with hypoxia Patient was placed on vancomycin and cefepime which is being continued.  Follow-up on blood cultures.  She has been afebrile.  WBC is noted to be higher today.  Influenza panel was negative.  Continue current antibiotics for now.  Continue oxygen.  End-stage renal disease on hemodialysis on Tuesday Thursday Saturday Nephrology has been consulted.  Patient was dialyzed yesterday.  No evidence for fluid overload.  Next dialysis is tomorrow.  Chronic anemia likely due to kidney disease Hemoglobin is stable.  No evidence of overt bleeding.  Essential hypertension Holding Imdur.  Normotensive.  Continue to monitor.  Hyperlipidemia Continue home medications.  History of coronary artery disease Continue aspirin and Plavix.  History of combined systolic and diastolic CHF Based on echocardiogram from 2017 EF is 45 to 50%.  Grade 2 diastolic dysfunction was noted.  Volume being managed with hemodialysis.  Generalized deconditioning Most likely due to pneumonia.  PT evaluation.   DVT Prophylaxis: Subcutaneous heparin    Code Status: Full code Family Communication: Discussed with the patient Disposition Plan: Management as outlined above.    LOS: 1 day   [facility] Pager [phone] 05/12/2018, 10:55 AM  If 7PM-7AM, please contact night-coverage at [email or web address], password [facility]

## 2018-05-12 NOTE — Evaluation (Signed)
Physical Therapy Evaluation Patient Details Name: Patrick [name] MRN: [id number] DOB: 1956/02/17 Today's Date: 05/12/2018   History of Present Illness  Pt is a 62 y.o. M with significant PMH of CHF, coronary artery disease, ESRD on hemodialysis, hypertension, type 2 diabetes presenting with 2-3 day history of fevers, generalized weakness, SOB. Chest x-ray showing new patchy airspace disease in left upper and lower lobes suspicious for multifocal pneumonia.   Clinical Impression  Pt admitted with above diagnosis. Pt currently with functional limitations due to the deficits listed below (see PT Problem List). Prior to admission, patient was a limited community ambulator; reports difficulty with long distance ambulation due to lower extremity claudication. Denies history of falls. Currently, presenting with decreased functional mobility secondary to diminished endurance and functional strength deficits. Ambulating 80 feet with supervision and required one standing rest break. DOE 3/4, SpO2 > 90% on RA. Educated on progressive ambulation and activity recommendations. Pt will benefit from skilled PT to increase their independence and safety with mobility to allow discharge to the venue listed below.       Follow Up Recommendations Home health PT    Equipment Recommendations  None recommended by PT    Recommendations for Other Services       Precautions / Restrictions Precautions Precautions: Fall Restrictions Weight Bearing Restrictions: No      Mobility  Bed Mobility Overal bed mobility: Modified Independent                Transfers Overall transfer level: Needs assistance Equipment used: None Transfers: Sit to/from Stand Sit to Stand: Supervision            Ambulation/Gait Ambulation/Gait assistance: Supervision Gait Distance (Feet): 80 Feet Assistive device: None Gait Pattern/deviations: Step-through pattern;Wide base of support Gait velocity: decreased    General Gait Details: Mild unsteadiness noted but no overt LOB. Requiring one standing rest break.   Stairs            Wheelchair Mobility    Modified Rankin (Stroke Patients Only)       Balance Overall balance assessment: Mild deficits observed, not formally tested                                           Pertinent Vitals/Pain Pain Assessment: No/denies pain    Home Living Family/patient expects to be discharged to:: Private residence Living Arrangements: Spouse/significant other;Children Available Help at Discharge: Family Type of Home: Apartment Home Access: Stairs to enter Entrance Stairs-Rails: Psychiatric nurse of Steps: 14 Home Layout: One level Home Equipment: None      Prior Function Level of Independence: Independent         Comments: Patient is a limited Hydrographic surveyor and drives. Uses a scooter for grocery store shopping.     Hand Dominance        Extremity/Trunk Assessment   Upper Extremity Assessment Upper Extremity Assessment: Overall WFL for tasks assessed    Lower Extremity Assessment Lower Extremity Assessment: Overall WFL for tasks assessed       Communication   Communication: No difficulties  Cognition Arousal/Alertness: Awake/alert Behavior During Therapy: WFL for tasks assessed/performed Overall Cognitive Status: Within Functional Limits for tasks assessed  General Comments General comments (skin integrity, edema, etc.): HR 115-125 bpm, SpO2 > 90% on RA during mobility    Exercises     Assessment/Plan    PT Assessment Patient needs continued PT services  PT Problem List Decreased strength;Decreased activity tolerance;Decreased balance;Decreased mobility;Cardiopulmonary status limiting activity       PT Treatment Interventions DME instruction;Gait training;Stair training;Functional mobility training;Therapeutic  activities;Therapeutic exercise;Balance training;Patient/family education    PT Goals (Current goals can be found in the Care Plan section)  Acute Rehab PT Goals Patient Stated Goal: get around better PT Goal Formulation: With patient Time For Goal Achievement: 05/26/18 Potential to Achieve Goals: Good    Frequency Min 3X/week   Barriers to discharge Inaccessible home environment      Co-evaluation               AM-PAC PT "6 Clicks" Daily Activity  Outcome Measure Difficulty turning over in bed (including adjusting bedclothes, sheets and blankets)?: None Difficulty moving from lying on back to sitting on the side of the bed? : None Difficulty sitting down on and standing up from a chair with arms (e.g., wheelchair, bedside commode, etc,.)?: A Little Help needed moving to and from a bed to chair (including a wheelchair)?: A Little Help needed walking in hospital room?: A Little Help needed climbing 3-5 steps with a railing? : A Lot 6 Click Score: 19    End of Session   Activity Tolerance: Patient tolerated treatment well Patient left: in chair;with call bell/phone within reach Nurse Communication: Mobility status PT Visit Diagnosis: Unsteadiness on feet (R26.81);Difficulty in walking, not elsewhere classified (R26.2)    Time: 9758-8325 PT Time Calculation (min) (ACUTE ONLY): 13 min   Charges:   PT Evaluation $PT Eval Moderate Complexity: 1 Mod        Ellamae Sia, [place], DPT Acute Rehabilitation Services Pager [phone] Office [phone]  Willy Eddy 05/12/2018, 5:06 PM

## 2018-05-12 NOTE — Progress Notes (Signed)
PT Cancellation Note  Patient Details Name: Patrick [name] MRN: [id number] DOB: 1956-04-22   Cancelled Treatment:    Reason Eval/Treat Not Completed: Patient at procedure or test/unavailable. Staff in room with pt upon evaluation attempt. Will continue to follow and initiate PT eval when pt is available.    [name] 05/12/2018, 2:49 PM   Rolinda Roan, PT, DPT Acute Rehabilitation Services Pager: [phone] Office: [phone]

## 2018-05-13 LAB — GLUCOSE, CAPILLARY
GLUCOSE-CAPILLARY: 92 mg/dL (ref 70–99)
GLUCOSE-CAPILLARY: 160 mg/dL — AB (ref 70–99)
Glucose-Capillary: 149 mg/dL — ABNORMAL HIGH (ref 70–99)

## 2018-05-13 LAB — RENAL FUNCTION PANEL
ALBUMIN: 2.7 g/dL — AB (ref 3.5–5.0)
Anion gap: 12 (ref 5–15)
BUN: 49 mg/dL — ABNORMAL HIGH (ref 8–23)
CALCIUM: 8.3 mg/dL — AB (ref 8.9–10.3)
CHLORIDE: 101 mmol/L (ref 98–111)
CO2: 22 mmol/L (ref 22–32)
CREATININE: 14.26 mg/dL — AB (ref 0.61–1.24)
GFR, EST AFRICAN AMERICAN: 4 mL/min — AB (ref >60)
GFR, EST NON AFRICAN AMERICAN: 3 mL/min — AB (ref >60)
Glucose, Bld: 114 mg/dL — ABNORMAL HIGH (ref 70–99)
Phosphorus: 2.8 mg/dL (ref 2.5–4.6)
Potassium: 3.9 mmol/L (ref 3.5–5.1)
SODIUM: 135 mmol/L (ref 135–145)

## 2018-05-13 LAB — CBC
HEMATOCRIT: 29.2 % — AB (ref 39.0–52.0)
Hemoglobin: 8.7 g/dL — ABNORMAL LOW (ref 13.0–17.0)
MCH: 29.1 pg (ref 26.0–34.0)
MCHC: 29.8 g/dL — ABNORMAL LOW (ref 30.0–36.0)
MCV: 97.7 fL (ref 80.0–100.0)
NRBC: 0 % (ref 0.0–0.2)
PLATELETS: 161 10*3/uL (ref 150–400)
RBC: 2.99 MIL/uL — ABNORMAL LOW (ref 4.22–5.81)
RDW: 16.5 % — ABNORMAL HIGH (ref 11.5–15.5)
WBC: 12.7 10*3/uL — AB (ref 4.0–10.5)

## 2018-05-13 MED ORDER — ALTEPLASE 2 MG IJ SOLR
4.0000 mg | INTRAMUSCULAR | Status: DC
  Administered 2018-05-13: 4 mg

## 2018-05-13 MED ORDER — VANCOMYCIN HCL IN DEXTROSE 1-5 GM/200ML-% IV SOLN
INTRAVENOUS | Status: AC
  Administered 2018-05-13: 1000 mg via INTRAVENOUS
  Filled 2018-05-13: qty 200

## 2018-05-13 MED ORDER — RENA-VITE PO TABS
1.0000 | ORAL_TABLET | Freq: Every day | ORAL | Status: DC
  Administered 2018-05-13: 1 via ORAL
  Filled 2018-05-13 (×5): qty 1

## 2018-05-13 MED ORDER — SODIUM CHLORIDE 0.9 % IV SOLN
INTRAVENOUS | Status: DC | PRN
  Administered 2018-05-13: 1000 mL via INTRAVENOUS

## 2018-05-13 MED ORDER — ONDANSETRON HCL 4 MG/2ML IJ SOLN: 4.0000 mg | Freq: Four times a day (QID) | INTRAMUSCULAR | Status: DC | PRN

## 2018-05-13 MED ORDER — HEPARIN SODIUM (PORCINE) 1000 UNIT/ML IJ SOLN
INTRAMUSCULAR | Status: AC
  Administered 2018-05-13: 3000 [IU] via INTRAVENOUS_CENTRAL
  Filled 2018-05-13: qty 3

## 2018-05-13 MED ORDER — HEPARIN SODIUM (PORCINE) 1000 UNIT/ML IJ SOLN
INTRAMUSCULAR | Status: AC
  Filled 2018-05-13: qty 5

## 2018-05-13 MED ORDER — ALTEPLASE 2 MG IJ SOLR
INTRAMUSCULAR | Status: AC
  Administered 2018-05-13: 4 mg
  Filled 2018-05-13: qty 4

## 2018-05-13 MED ORDER — HEPARIN SODIUM (PORCINE) 1000 UNIT/ML DIALYSIS
3000.0000 [IU] | Freq: Once | INTRAMUSCULAR | Status: AC
  Administered 2018-05-13: 3000 [IU] via INTRAVENOUS_CENTRAL

## 2018-05-13 NOTE — Progress Notes (Addendum)
[facility] PROGRESS NOTE  DANDRAE KUSTRA GNF:[id number] DOB: 08/21/1955 DOA: 05/11/2018  PCP: Nolene Ebbs, MD  Brief History/Interval Summary: 62 y.o. male with medical history significant of CHF, coronary artery disease, ESRD on hemodialysis, hypertension, 2 diabetes presented to the hospital with a 2 to 3-day history of fevers, generalized weakness, and shortness of breath at home.  He has also been having a nonproductive cough.    Patient was noted to be hypoxic requiring 2 L of oxygen.  WBC was noted to be elevated.  Chest x-ray showed airspace disease in the left lung.  Concern was for healthcare associated pneumonia.  Patient was hospitalized and placed on broad-spectrum antibiotics.  Reason for Visit: HCAP  Consultants: Nephrology  Procedures: Hemodialysis  Antibiotics: Vancomycin and cefepime  Subjective/Interval History: Patient states that he continues to have a cough.  Now with expectoration of mucus sputum occasionally blood-tinged.  Denies any yellowish or greenish discoloration.  Still has shortness of breath especially when he is lying flat in the bed.  No chest pain.    ROS: Denies any headaches  Objective:  Vital Signs  Vitals:   05/13/18 0535 05/13/18 0808 05/13/18 0848 05/13/18 0900  BP: 128/74 114/74 (!) 105/56 119/73  Pulse: 85 94 83 86  Resp: 18 19    Temp: 98.6 F (37 C) 98.4 F (36.9 C)    TempSrc: Oral Oral    SpO2: 100%     Weight:  105.6 kg    Height:        Intake/Output Summary (Last 24 hours) at 05/13/2018 1041 Last data filed at 05/13/2018 0535 Gross per 24 hour  Intake 726 ml  Output 0 ml  Net 726 ml   Filed Weights   05/11/18 2005 05/12/18 2054 05/13/18 0808  Weight: 103.4 kg 103.4 kg 105.6 kg    General appearance: Awake alert.  In no distress. Resp: Noted to be tachypneic at rest.  Coarse breath sounds bilaterally.  Few crackles in the left lung.  No wheezing or rhonchi.   Cardio: S1-S2 is normal regular.  No  S3-S4.  No rubs murmurs or bruit GI: Abdomen soft.  Nontender nondistended.  Bowel sounds are present normal.  No masses organomegaly. Extremities: No edema Neurologic: No focal neurological deficits.  Lab Results:  Data Reviewed: I have personally reviewed following labs and imaging studies  CBC: Recent Labs  Lab 05/11/18 1735 05/12/18 0643 05/13/18 0837  WBC 11.5* 13.1* 12.7*  NEUTROABS 9.6*  --   --   HGB 10.2* 10.2* 8.7*  HCT 32.7* 33.8* 29.2*  MCV 95.1 96.0 97.7  PLT 174 158 846    Basic Metabolic Panel: Recent Labs  Lab 05/11/18 1735 05/12/18 0643 05/13/18 0837  NA 134*  --  135  K 3.2*  --  3.9  CL 99  --  101  CO2 22  --  22  GLUCOSE 158*  --  114*  BUN 25*  --  49*  CREATININE 9.83*  --  14.26*  CALCIUM 10.2  --  8.3*  MG  --  1.9  --   PHOS  --   --  2.8    GFR: Estimated Creatinine Clearance: 6.1 mL/min (A) (by C-G formula based on SCr of 14.26 mg/dL (H)).  Liver Function Tests: Recent Labs  Lab 05/11/18 1735 05/13/18 0837  AST 34  --   ALT 12  --   ALKPHOS 53  --   BILITOT 1.0  --   PROT 7.3  --  ALBUMIN 3.4* 2.7*    CBG: Recent Labs  Lab 05/12/18 0820 05/12/18 1124 05/12/18 1735 05/12/18 2054  GLUCAP 109* 120* 118* 154*     Recent Results (from the past 240 hour(s))  Blood Culture (routine x 2)     Status: None (Preliminary result)   Collection Time: 05/11/18  5:13 PM  Result Value Ref Range Status   Specimen Description BLOOD LEFT HAND  Final   Special Requests   Final    BOTTLES DRAWN AEROBIC AND ANAEROBIC Blood Culture adequate volume   Culture   Final    NO GROWTH 2 DAYS Performed at [facility], [place] [address], [place], [place] [address]    Report Status PENDING  Incomplete  Blood Culture (routine x 2)     Status: None (Preliminary result)   Collection Time: 05/11/18  5:50 PM  Result Value Ref Range Status   Specimen Description BLOOD RIGHT HAND  Final   Special Requests   Final    BOTTLES DRAWN AEROBIC AND  ANAEROBIC Blood Culture adequate volume   Culture   Final    NO GROWTH 2 DAYS Performed at [facility], [place] [address], [place], [place] [address]    Report Status PENDING  Incomplete  MRSA PCR Screening     Status: Abnormal   Collection Time: 05/12/18  3:22 AM  Result Value Ref Range Status   MRSA by PCR POSITIVE (A) NEGATIVE Final    Comment:        The GeneXpert MRSA Assay (FDA approved for NASAL specimens only), is one component of a comprehensive MRSA colonization surveillance program. It is not intended to diagnose MRSA infection nor to guide or monitor treatment for MRSA infections. RESULT CALLED TO, READ BACK BY AND VERIFIED WITH: A MINTZ RN [phone] 05/12/18 A BROWNING Performed at [facility], [place] [address], [place],  [address]       Radiology Studies: Dg Chest 2 View  Result Date: 05/11/2018 CLINICAL DATA:  Cough and fever. EXAM: CHEST - 2 VIEW COMPARISON:  Chest x-ray dated Dec 15, 2017. FINDINGS: Unchanged tunneled left internal jugular dialysis catheter with the tip in the right atrium. Stable cardiomegaly. Normal pulmonary vascularity. New patchy airspace disease in the left upper and lower lobes. No pleural effusion or pneumothorax. No acute osseous abnormality. IMPRESSION: Patchy airspace disease in the left upper and lower lobes, suspicious for multifocal pneumonia given clinical history. Followup PA and lateral chest X-ray is recommended in 3-4 weeks following trial of antibiotic therapy to ensure resolution. Electronically Signed   By: Titus Dubin M.D.   On: 05/11/2018 18:34     Medications:  Scheduled: . aspirin EC  81 mg Oral Daily  . calcium acetate  1,334 mg Oral Q breakfast  . Chlorhexidine Gluconate Cloth  6 each Topical Q0600  . Chlorhexidine Gluconate Cloth  6 each Topical Q0600  . clopidogrel  75 mg Oral Daily  . ezetimibe  10 mg Oral Daily  . guaiFENesin  600 mg Oral BID  . heparin  5,000 Units Subcutaneous Q8H  .  insulin aspart  0-9 Units Subcutaneous TID WC  . mouth rinse  15 mL Mouth Rinse BID  . mupirocin ointment  1 application Nasal BID   Continuous: . ceFEPime (MAXIPIME) IV    . vancomycin     YKD:[id number], albuterol, guaiFENesin, ondansetron (ZOFRAN) IV  Assessment/Plan:    Multifocal pneumonia possibly healthcare associated/acute respiratory failure with hypoxia Patient continues to have  a cough and continues to feel short of breath.  He is producing sputum now which is occasionally blood-tinged.  No significant hemoptysis has been noted.  WBC noted to be better.  Influenza panel was negative.  Patient was placed on vancomycin and cefepime.  Blood cultures negative so far.  MRSA PCR was positive.  Will repeat chest x-ray today.  Consider changing to Levaquin depending on x-ray findings.  Wean down oxygen as tolerated.  Continue Mucinex.  Flutter valve.  Gram-negative bacteremia Called by nephrologist stating that the dialysis center called him with a positive blood cultures.  This blood was drawn at the dialysis center. It is growing gram-negative rods.  Nephrology has discussed with ID.  Patient will need 2 weeks of treatment with Tressie Ellis which can be administered with hemodialysis.  He will need to wait on final identification and sensitivity.  End-stage renal disease on hemodialysis on Tuesday Thursday Saturday Nephrology is following.  Patient dialyzed on Tuesday.  Being dialyzed today.    Chronic anemia likely due to kidney disease Drop in hemoglobin is noted this morning.  No overt bleeding has been noted.  Very mild hemoptysis is most likely due to pneumonia.  Continue to monitor for now.  Recheck labs tomorrow.  Essential hypertension Holding his Imdur.  Blood pressure remains adequately controlled.  Hyperlipidemia Continue home medications.  History of coronary artery disease Continue aspirin and Plavix.  History of combined systolic and diastolic CHF Based on  echocardiogram from 2017 EF is 45 to 50%.  Grade 2 diastolic dysfunction was noted.  Volume being managed with hemodialysis.  Generalized deconditioning Most likely due to pneumonia.  Home health recommended by physical therapy.   DVT Prophylaxis: Subcutaneous heparin    Code Status: Full code Family Communication: Discussed with the patient Disposition Plan: Regimen as outlined above.  Chest x-ray to be ordered for persistent shortness of breath and cough.    LOS: 2 days   [facility] Pager [phone] 05/13/2018, 10:41 AM  If 7PM-7AM, please contact night-coverage at [email or web address], password [facility]

## 2018-05-13 NOTE — Progress Notes (Signed)
PT Cancellation Note  Patient Details Name: Patrick [name] MRN: [id number] DOB: 1956/06/01   Cancelled Treatment:    Reason Eval/Treat Not Completed: Patient at procedure or test/unavailable. Pt in HD.  Will f/u per [place] 05/13/2018, 2:35 PM

## 2018-05-13 NOTE — Progress Notes (Addendum)
[facility] Progress Note   Subjective:   Seen in dialysis.  Feeling slightly better today.  Breathing improving.   Objective Vitals:   05/13/18 0535 05/13/18 0808 05/13/18 0848 05/13/18 0900  BP: 128/74 114/74 (!) 105/56 119/73  Pulse: 85 94 83 86  Resp: 18 19    Temp: 98.6 F (37 C) 98.4 F (36.9 C)    TempSrc: Oral Oral    SpO2: 100%     Weight:  105.6 kg    Height:       Physical Exam General:NAD, chronically ill appearing male Heart:RRR, no mrg Lungs:+wheezing b/l, breath sounds diminished Abdomen:soft, NTND Extremities:no LE edema Dialysis Access: L [facility]   Filed Weights   05/11/18 2005 05/12/18 2054 05/13/18 0808  Weight: 103.4 kg 103.4 kg 105.6 kg    Intake/Output Summary (Last 24 hours) at 05/13/2018 1204 Last data filed at 05/13/2018 0535 Gross per 24 hour  Intake 726 ml  Output 0 ml  Net 726 ml    Additional Objective Labs: Basic Metabolic Panel: Recent Labs  Lab 05/11/18 1735 05/13/18 0837  NA 134* 135  K 3.2* 3.9  CL 99 101  CO2 22 22  GLUCOSE 158* 114*  BUN 25* 49*  CREATININE 9.83* 14.26*  CALCIUM 10.2 8.3*  PHOS  --  2.8   Liver Function Tests: Recent Labs  Lab 05/11/18 1735 05/13/18 0837  AST 34  --   ALT 12  --   ALKPHOS 53  --   BILITOT 1.0  --   PROT 7.3  --   ALBUMIN 3.4* 2.7*   CBC: Recent Labs  Lab 05/11/18 1735 05/12/18 0643 05/13/18 0837  WBC 11.5* 13.1* 12.7*  NEUTROABS 9.6*  --   --   HGB 10.2* 10.2* 8.7*  HCT 32.7* 33.8* 29.2*  MCV 95.1 96.0 97.7  PLT 174 158 161   CBG: Recent Labs  Lab 05/12/18 0820 05/12/18 1124 05/12/18 1735 05/12/18 2054  GLUCAP 109* 120* 118* 154*   Studies/Results: Dg Chest 2 View  Result Date: 05/11/2018 CLINICAL DATA:  Cough and fever. EXAM: CHEST - 2 VIEW COMPARISON:  Chest x-ray dated Dec 15, 2017. FINDINGS: Unchanged tunneled left internal jugular dialysis catheter with the tip in the right atrium. Stable cardiomegaly. Normal pulmonary vascularity.  New patchy airspace disease in the left upper and lower lobes. No pleural effusion or pneumothorax. No acute osseous abnormality. IMPRESSION: Patchy airspace disease in the left upper and lower lobes, suspicious for multifocal pneumonia given clinical history. Followup PA and lateral chest X-ray is recommended in 3-4 weeks following trial of antibiotic therapy to ensure resolution. Electronically Signed   By: Titus Dubin M.D.   On: 05/11/2018 18:34    Medications: . ceFEPime (MAXIPIME) IV    . vancomycin 1,000 mg (05/13/18 1137)   . aspirin EC  81 mg Oral Daily  . calcium acetate  1,334 mg Oral Q breakfast  . Chlorhexidine Gluconate Cloth  6 each Topical Q0600  . Chlorhexidine Gluconate Cloth  6 each Topical Q0600  . clopidogrel  75 mg Oral Daily  . ezetimibe  10 mg Oral Daily  . guaiFENesin  600 mg Oral BID  . heparin  5,000 Units Subcutaneous Q8H  . insulin aspart  0-9 Units Subcutaneous TID WC  . mouth rinse  15 mL Mouth Rinse BID  . mupirocin ointment  1 application Nasal BID    Dialysis Orders: [facility]  4h   2/3.5 bath  104.5kg   Hep  3000   L TDC  - mircera 75 last 10/15  Assessment/Plan: 1. PNA - CXR showed patchy airspace disease in left lung concerning for multifocal PNA.  WBC 12.7. Vanco/cefepime started.     2. Bacteremia - BC drawn from HD on tues returned +gram negative bacilli. Has TDC, likely to need line holiday, consulting ID. +blood cx's from OP HD c/w bacteremia from PNA.  Have d/w ID, can keep HD cath in w/ gram neg bacteremia. Recommends treat 2 wks IV abx w/ HD, Tressie Ellis is ok or more narrow if possible when sensitivities are back.  Have dw/ pmd.  3. ESRD - on HD TTS.  Tolerating HD today.  Continue per regular schedule while admitted.  4. Anemia of CKD- Hgb 8.7. ESA recently dosed. Continue to follow trends.  5. Secondary hyperparathyroidism - Ca and phos in goal.  Continue meds 6. HTN/volume - BP well controlled. Does not appear volume overloaded on exam.   7. Nutrition - Renal diet with fluid restrictions. Renavite. 8. CAD s/p CABG in [place], PA-C [place] Pager: [phone] 05/13/2018,12:04 PM  LOS: 2 days   Pt seen, examined, agree w assess/plan as above with additions as indicated.  Kelly Splinter MD [employer] pager [phone]    cell [phone] 05/13/2018, 1:17 PM

## 2018-05-14 ENCOUNTER — Inpatient Hospital Stay (HOSPITAL_COMMUNITY): Payer: Medicare Other

## 2018-05-14 DIAGNOSIS — R7881 Bacteremia: Secondary | ICD-10-CM

## 2018-05-14 LAB — GLUCOSE, CAPILLARY
Glucose-Capillary: 129 mg/dL — ABNORMAL HIGH (ref 70–99)
Glucose-Capillary: 111 mg/dL — ABNORMAL HIGH (ref 70–99)
Glucose-Capillary: 165 mg/dL — ABNORMAL HIGH (ref 70–99)
Glucose-Capillary: 139 mg/dL — ABNORMAL HIGH (ref 70–99)

## 2018-05-14 LAB — CBC
HEMATOCRIT: 27.9 % — AB (ref 39.0–52.0)
HEMOGLOBIN: 8.4 g/dL — AB (ref 13.0–17.0)
MCH: 28.9 pg (ref 26.0–34.0)
MCHC: 30.1 g/dL (ref 30.0–36.0)
MCV: 95.9 fL (ref 80.0–100.0)
NRBC: 0 % (ref 0.0–0.2)
Platelets: 172 10*3/uL (ref 150–400)
RBC: 2.91 MIL/uL — AB (ref 4.22–5.81)
RDW: 16.6 % — ABNORMAL HIGH (ref 11.5–15.5)
WBC: 9.8 10*3/uL (ref 4.0–10.5)

## 2018-05-14 MED ORDER — PRO-STAT SUGAR FREE PO LIQD
30.0000 mL | Freq: Two times a day (BID) | ORAL | Status: DC
  Administered 2018-05-14 – 2018-05-17 (×4): 30 mL via ORAL
  Filled 2018-05-14 (×5): qty 30

## 2018-05-14 MED ORDER — SODIUM CHLORIDE 0.9 % IV BOLUS
250.0000 mL | Freq: Once | INTRAVENOUS | Status: AC
  Administered 2018-05-14: 250 mL via INTRAVENOUS

## 2018-05-14 NOTE — Progress Notes (Addendum)
[facility] Progress Note   Subjective:  Seen in room. Feels improved overall, but continues to cough. Had CXR last evening which appeared to looks worse from pneumonia standpoint. T100.2 overnight.  Objective Vitals:   05/13/18 2114 05/14/18 0600 05/14/18 0809 05/14/18 1002  BP: 104/86 115/80 (!) 87/49 107/65  Pulse: 99  81 85  Resp: 18 18 18    Temp: 98.2 F (36.8 C) 100.2 F (37.9 C) 98.6 F (37 C)   TempSrc: Oral Oral Oral   SpO2: 97% 100% 97%   Weight: 103.3 kg     Height:       Physical Exam General: Overweight man, NAD. + coughing. Heart: RRR; no murmur Lungs: No wheezing; rhonchi in R lung base -- clear in upper lobes Abdomen: soft, non-tender Extremities: No LE edema Dialysis Access: L TDC, no tenderness  Additional Objective Labs: Basic Metabolic Panel: Recent Labs  Lab 05/11/18 1735 05/13/18 0837  NA 134* 135  K 3.2* 3.9  CL 99 101  CO2 22 22  GLUCOSE 158* 114*  BUN 25* 49*  CREATININE 9.83* 14.26*  CALCIUM 10.2 8.3*  PHOS  --  2.8   Liver Function Tests: Recent Labs  Lab 05/11/18 1735 05/13/18 0837  AST 34  --   ALT 12  --   ALKPHOS 53  --   BILITOT 1.0  --   PROT 7.3  --   ALBUMIN 3.4* 2.7*   CBC: Recent Labs  Lab 05/11/18 1735 05/12/18 0643 05/13/18 0837 05/14/18 0542  WBC 11.5* 13.1* 12.7* 9.8  NEUTROABS 9.6*  --   --   --   HGB 10.2* 10.2* 8.7* 8.4*  HCT 32.7* 33.8* 29.2* 27.9*  MCV 95.1 96.0 97.7 95.9  PLT 174 158 161 172   Blood Culture    Component Value Date/Time   SDES BLOOD RIGHT HAND 05/11/2018 1750   SPECREQUEST  05/11/2018 1750    BOTTLES DRAWN AEROBIC AND ANAEROBIC Blood Culture adequate volume   CULT  05/11/2018 1750    NO GROWTH 3 DAYS Performed at [facility] [address], [place], [place] [address]    REPTSTATUS PENDING 05/11/2018 1750   Studies/Results: Dg Chest 2 View  Result Date: 05/14/2018 CLINICAL DATA:  Cough and shortness of breath for 3 days. EXAM: CHEST - 2 VIEW  COMPARISON:  Radiograph 3 days prior 05/11/2018 FINDINGS: Left-sided dialysis catheter tip at the atrial caval junction. Progressive opacities throughout the left hemithorax from prior exam. No focal airspace disease in the right hemithorax. Post median sternotomy. Unchanged cardiomegaly. Vascular stent in the region of the right subclavian/innominate. Suspect small left pleural effusion. No pneumothorax. IMPRESSION: Progressive patchy opacities throughout the left hemithorax over the past 3 days, suspicious for pneumonia. Suspect small left pleural effusion. Electronically Signed   By: Keith Rake M.D.   On: 05/14/2018 00:57   Medications: . sodium chloride 1,000 mL (05/13/18 1426)  . ceFEPime (MAXIPIME) IV 2 g (05/13/18 1428)  . vancomycin Stopped (05/13/18 1255)   . alteplase  4 mg Intracatheter Once per day on Tue Thu Sat  . aspirin EC  81 mg Oral Daily  . calcium acetate  1,334 mg Oral Q breakfast  . Chlorhexidine Gluconate Cloth  6 each Topical Q0600  . Chlorhexidine Gluconate Cloth  6 each Topical Q0600  . clopidogrel  75 mg Oral Daily  . ezetimibe  10 mg Oral Daily  . guaiFENesin  600 mg Oral BID  . heparin  5,000 Units Subcutaneous Q8H  .  insulin aspart  0-9 Units Subcutaneous TID WC  . mouth rinse  15 mL Mouth Rinse BID  . multivitamin  1 tablet Oral QHS  . mupirocin ointment  1 application Nasal BID   Dialysis Orders: TTS at [facility] 4hr, 2K/3.5Ca, EDW 104.5kg, TDC, heparin 3000 bolus - Mircera 7mcg IV q 2 weeks (last given 10/15)  Assessment/Plan: 1. HCAP: CXR with L lung patchy airspace disease. WBC high on admit. On Vanc/Cefepime. 2. Bacteremia: BCx 10/15 from outpatient unit + for GN coccibacilli -- final Cx pending. We spoke w/ ID, plan was to keep catheter in and treat throuhg w 2 wks of Ceftazidime, however, with worsened CXR appearance, will re-draw blood cultures and consider removing cath after HD tomorrow. 2. ESRD: Will continue HD per TTS schedule, next  10/19. 3. HTN/volume: BP dropped this morning, NS fluid bolus given. Under EDW at this time. 4. Anemia: Hgb 8.4, low. Not due for ESA yet. 5. Secondary hyperparathyroidism: Ca/Phos ok. Does not appear to be on VDRA/binders. 6. Nutrition: Alb low, will add pro-stat supplements. 7. CAD (Hx CABG)  Veneta Penton, PA-C 05/14/2018, 11:34 AM  [facility] Pager: [phone]  Pt seen, examined and agree w A/P as above.  Kelly Splinter MD [employer] pager [phone]   05/14/2018, 12:22 PM

## 2018-05-14 NOTE — Progress Notes (Signed)
Pharmacy Antibiotic Note  Patrick [name] is a 62 y.o. male on day # 4 Vancomycin and Cefepime for bacteremia and HCAP coverage.  ESRD, TTS hemodialysis. Last session 10/17 and plan again 10/19 on schedule.  Vancomycin and Cefepime doses given 10/17 after HD. Awaiting blood culture report from [facility], reported as GN coccobacilli. Currently planning 2 weeks of IV antibiotics, change to Ceftazidime at discharge, but noted plan to consider removing HD cath on 10/19 due to fevers overnight and worsening CXR.  Plan:  Continue Vancomycin 1gm IV after HD-TTS  Continue Cefepime 2gm IV after HD- TTS.  Follow up culture data, clinical progress and antibiotic plans.   Height: 5\' 7"  (170.2 cm) Weight: 227 lb 11.8 oz (103.3 kg) IBW/kg (Calculated) : 66.1  Temp (24hrs), Avg:100.2 F (37.9 C), Min:98.2 F (36.8 C), Max:102.7 F (39.3 C)  Recent Labs  Lab 05/11/18 1735 05/11/18 1742 05/11/18 1926 05/11/18 1936 05/12/18 0643 05/13/18 0837 05/14/18 0542  WBC 11.5*  --   --   --  13.1* 12.7* 9.8  CREATININE 9.83*  --   --   --   --  14.26*  --   LATICACIDVEN  --  1.89  --  1.84  --   --   --   VANCOTROUGH  --   --  14*  --   --   --   --     Estimated Creatinine Clearance: 6.1 mL/min (A) (by C-G formula based on SCr of 14.26 mg/dL (H)).    Allergies  Allergen Reactions  . Iodinated Diagnostic Agents Swelling, Rash and Other (See Comments)    Other Reaction: burning to mouth, swelling of lips. Other Reaction: burning to mouth, swelling of lips.  . Lipitor [Atorvastatin] Other (See Comments)    Leg pain  . Metoprolol Other (See Comments)    Headaches     Antimicrobials this admission:   Vanc 10/15 (1st dose at [facility]) >>   Cefepime 10/15>>   CHG/Bactroban 10/16>>(10/20)  Dose adjustments this admission:     10/15: VR 14 mcg/ml (got Vanc x 1 PTA at [facility])  Microbiology results:   10/16 - Flu PCR negative   10/16 HIV non-reactive   10/16 MRSA PCR positive  10/16 blood x 2 - ng x 3 days to date   10/15 outpt BCx - GN coccobacilli per renal note  Thank you for allowing pharmacy to be a part of this patient's care.  Arty Baumgartner, [place] Pager: [phone] 05/14/2018 1:44 PM

## 2018-05-14 NOTE — Progress Notes (Signed)
[facility] PROGRESS NOTE  LOVIE AGRESTA OMB:[id number] DOB: Nov 15, 1955 DOA: 05/11/2018  PCP: Nolene Ebbs, MD  Brief History/Interval Summary: 62 y.o. male with medical history significant of CHF, coronary artery disease, ESRD on hemodialysis, hypertension, 2 diabetes presented to the hospital with a 2 to 3-day history of fevers, generalized weakness, and shortness of breath at home.  He has also been having a nonproductive cough.    Patient was noted to be hypoxic requiring 2 L of oxygen.  WBC was noted to be elevated.  Chest x-ray showed airspace disease in the left lung.  Concern was for healthcare associated pneumonia.  Patient was hospitalized and placed on broad-spectrum antibiotics.  Reason for Visit: HCAP  Consultants: Nephrology  Procedures: Hemodialysis  Antibiotics: Vancomycin and cefepime  Subjective/Interval History: Patient had fever overnight.  Feels a little bit lightheaded this morning.  Blood pressure was noted to be low.  Patient to get fluid bolus.  He denies any chest pains.  Continues to have a cough.  No further blood in the sputum.  No change in his shortness of breath.     ROS: Denies any headaches  Objective:  Vital Signs  Vitals:   05/13/18 2114 05/14/18 0600 05/14/18 0809 05/14/18 1002  BP: 104/86 115/80 (!) 87/49 107/65  Pulse: 99  81 85  Resp: 18 18 18    Temp: 98.2 F (36.8 C) 100.2 F (37.9 C) 98.6 F (37 C)   TempSrc: Oral Oral Oral   SpO2: 97% 100% 97%   Weight: 103.3 kg     Height:        Intake/Output Summary (Last 24 hours) at 05/14/2018 1119 Last data filed at 05/14/2018 0957 Gross per 24 hour  Intake 706 ml  Output 1600 ml  Net -894 ml   Filed Weights   05/13/18 0808 05/13/18 1230 05/13/18 2114  Weight: 105.6 kg 103.3 kg 103.3 kg    General appearance: Awake alert.  In no distress Resp: Still noted to be tachypneic although bit more comfortable today compared to yesterday.  Continues to have coarse breath  sounds bilaterally.  Crackles in the left lung.  No wheezing or rhonchi.    Cardio: S1-S2 is normal regular.  No S3-S4.  No rubs murmurs or bruit GI: Abdomen is soft.  Nontender nondistended.  Bowel sounds are present normal.  No masses organomegaly Extremities: No significant edema Neurologic: Alert and oriented x3.  No focal neurological deficits.  Lab Results:  Data Reviewed: I have personally reviewed following labs and imaging studies  CBC: Recent Labs  Lab 05/11/18 1735 05/12/18 0643 05/13/18 0837 05/14/18 0542  WBC 11.5* 13.1* 12.7* 9.8  NEUTROABS 9.6*  --   --   --   HGB 10.2* 10.2* 8.7* 8.4*  HCT 32.7* 33.8* 29.2* 27.9*  MCV 95.1 96.0 97.7 95.9  PLT 174 158 161 453    Basic Metabolic Panel: Recent Labs  Lab 05/11/18 1735 05/12/18 0643 05/13/18 0837  NA 134*  --  135  K 3.2*  --  3.9  CL 99  --  101  CO2 22  --  22  GLUCOSE 158*  --  114*  BUN 25*  --  49*  CREATININE 9.83*  --  14.26*  CALCIUM 10.2  --  8.3*  MG  --  1.9  --   PHOS  --   --  2.8    GFR: Estimated Creatinine Clearance: 6.1 mL/min (A) (by C-G formula based on SCr of 14.26 mg/dL (H)).  Liver Function Tests: Recent Labs  Lab 05/11/18 1735 05/13/18 0837  AST 34  --   ALT 12  --   ALKPHOS 53  --   BILITOT 1.0  --   PROT 7.3  --   ALBUMIN 3.4* 2.7*    CBG: Recent Labs  Lab 05/12/18 2054 05/13/18 1327 05/13/18 1740 05/13/18 2114 05/14/18 0810  GLUCAP 154* 92 160* 149* 129*     Recent Results (from the past 240 hour(s))  Blood Culture (routine x 2)     Status: None (Preliminary result)   Collection Time: 05/11/18  5:13 PM  Result Value Ref Range Status   Specimen Description BLOOD LEFT HAND  Final   Special Requests   Final    BOTTLES DRAWN AEROBIC AND ANAEROBIC Blood Culture adequate volume   Culture   Final    NO GROWTH 3 DAYS Performed at [facility], [place] [address] [place], [place] [address]    Report Status PENDING  Incomplete  Blood Culture (routine x 2)      Status: None (Preliminary result)   Collection Time: 05/11/18  5:50 PM  Result Value Ref Range Status   Specimen Description BLOOD RIGHT HAND  Final   Special Requests   Final    BOTTLES DRAWN AEROBIC AND ANAEROBIC Blood Culture adequate volume   Culture   Final    NO GROWTH 3 DAYS Performed at [facility], [place] [address] [place], [place] [address]    Report Status PENDING  Incomplete  MRSA PCR Screening     Status: Abnormal   Collection Time: 05/12/18  3:22 AM  Result Value Ref Range Status   MRSA by PCR POSITIVE (A) NEGATIVE Final    Comment:        The GeneXpert MRSA Assay (FDA approved for NASAL specimens only), is one component of a comprehensive MRSA colonization surveillance program. It is not intended to diagnose MRSA infection nor to guide or monitor treatment for MRSA infections. RESULT CALLED TO, READ BACK BY AND VERIFIED WITH: A MINTZ RN [phone] 05/12/18 A BROWNING Performed at [facility], [place] [address] [place], [place] [address]       Radiology Studies: Dg Chest 2 View  Result Date: 05/14/2018 CLINICAL DATA:  Cough and shortness of breath for 3 days. EXAM: CHEST - 2 VIEW COMPARISON:  Radiograph 3 days prior 05/11/2018 FINDINGS: Left-sided dialysis catheter tip at the atrial caval junction. Progressive opacities throughout the left hemithorax from prior exam. No focal airspace disease in the right hemithorax. Post median sternotomy. Unchanged cardiomegaly. Vascular stent in the region of the right subclavian/innominate. Suspect small left pleural effusion. No pneumothorax. IMPRESSION: Progressive patchy opacities throughout the left hemithorax over the past 3 days, suspicious for pneumonia. Suspect small left pleural effusion. Electronically Signed   By: Keith Rake M.D.   On: 05/14/2018 00:57     Medications:  Scheduled: . alteplase  4 mg Intracatheter Once per day on Tue Thu Sat  . aspirin EC  81 mg Oral Daily  . calcium acetate  1,334  mg Oral Q breakfast  . Chlorhexidine Gluconate Cloth  6 each Topical Q0600  . Chlorhexidine Gluconate Cloth  6 each Topical Q0600  . clopidogrel  75 mg Oral Daily  . ezetimibe  10 mg Oral Daily  . guaiFENesin  600 mg Oral BID  . heparin  5,000 Units Subcutaneous Q8H  . insulin aspart  0-9 Units Subcutaneous TID WC  . mouth rinse  15 mL Mouth Rinse BID  . multivitamin  1 tablet Oral QHS  . mupirocin ointment  1 application Nasal BID   Continuous: . sodium chloride 1,000 mL (05/13/18 1426)  . ceFEPime (MAXIPIME) IV 2 g (05/13/18 1428)  . sodium chloride    . vancomycin Stopped (05/13/18 1255)   ZRA:QTMAUQ chloride, acetaminophen, albuterol, guaiFENesin, ondansetron (ZOFRAN) IV  Assessment/Plan:    Multifocal pneumonia possibly healthcare associated/acute respiratory failure with hypoxia Patient's respiratory status is stable.  We will continue antibiotics flutter valve Mucinex.  Hemoptysis has resolved.  WBC is now normal.  Influenza panel was negative.  Continue broad-spectrum antibiotics for now till there is more clinical improvement.  MRSA PCR was positive.  He had positive blood cultures which were done at his outpatient dialysis center prior to admission.  Final identification is pending.  Chest x-ray done overnight showed worsening opacities in the left lung.  However clinically he is stable.  Continue to monitor.  Gram-negative bacteremia Called by nephrologist stating that the dialysis center called him with a positive blood cultures.  This blood was drawn at the dialysis center. It is growing gram-negative rods.  Nephrology has discussed with ID.  Patient will need 2 weeks of treatment with Tressie Ellis which can be administered with hemodialysis.  Will need to wait on final identification and sensitivity.  Noted to be febrile overnight.  If he continues to have persistent fever his dialysis catheter may need to be removed.  End-stage renal disease on hemodialysis on Tuesday Thursday  Saturday Nephrology is following and managing.  Patient being dialyzed as per his usual schedule.  Chronic anemia likely due to kidney disease Drop in hemoglobin was noted on 10/17.  No overt bleeding.  Hemoptysis appears to have resolved.  Hemoglobin is stable this morning.    Essential hypertension/hypotension Patient noted to be hypotensive this morning.  Likely secondary to combination of hypovolemia as well as fever.  Since he was symptomatic he was given normal saline bolus.  Improvement in blood pressure noted.  Patient feels better.  Continue to hold his antihypertensives.  Hyperlipidemia Continue home medications.  History of coronary artery disease Continue aspirin and Plavix.  History of combined systolic and diastolic CHF Based on echocardiogram from 2017 EF is 45 to 50%.  Grade 2 diastolic dysfunction was noted.  Volume being managed with hemodialysis.  Generalized deconditioning Most likely due to pneumonia.  Home health recommended by physical therapy.   DVT Prophylaxis: Subcutaneous heparin    Code Status: Full code Family Communication: Discussed with the patient. Disposition Plan: Management as outlined above.  Continue to wait on clinical improvement.    LOS: 3 days   [facility] Pager [phone] 05/14/2018, 11:19 AM  If 7PM-7AM, please contact night-coverage at [email or web address], password [facility]

## 2018-05-14 NOTE — Progress Notes (Signed)
Physical Therapy Treatment Patient Details Name: Patrick [name] MRN: [id number] DOB: 12-05-1955 Today's Date: 05/14/2018    History of Present Illness Pt is a 62 y.o. M with significant PMH of CHF, coronary artery disease, ESRD on hemodialysis, hypertension, type 2 diabetes presenting with 2-3 day history of fevers, generalized weakness, SOB. Chest x-ray showing new patchy airspace disease in left upper and lower lobes suspicious for multifocal pneumonia.     PT Comments    Pt received in bed, motivated to participate in therapy. Pt reports he just replaced his O2 via [place] due to a coughing spell. SpO2 94% on 2 L O2 at rest. Pt requesting to ambulate without O2. SpO2 85% after ambulating 150 feet on RA. 2 L O2 replaced with 2-minute recovery to reach SpO2 95%.     Follow Up Recommendations  Home health PT     Equipment Recommendations  None recommended by PT    Recommendations for Other Services       Precautions / Restrictions Precautions Precautions: Fall;Other (comment) Precaution Comments: watch sats Restrictions Weight Bearing Restrictions: No    Mobility  Bed Mobility Overal bed mobility: Modified Independent                Transfers   Equipment used: None   Sit to Stand: Supervision         General transfer comment: supervision for safety, no physical assist  Ambulation/Gait Ambulation/Gait assistance: Supervision Gait Distance (Feet): 150 Feet Assistive device: None Gait Pattern/deviations: Step-through pattern;Decreased stride length Gait velocity: decreased Gait velocity interpretation: <1.31 ft/sec, indicative of household ambulator General Gait Details: mild unsteadiness. One standing rest break. SpO2 85% on RA during ambulation.   Stairs             Wheelchair Mobility    Modified Rankin (Stroke Patients Only)       Balance Overall balance assessment: Mild deficits observed, not formally tested                                           Cognition Arousal/Alertness: Awake/alert Behavior During Therapy: WFL for tasks assessed/performed Overall Cognitive Status: Within Functional Limits for tasks assessed                                        Exercises      General Comments        Pertinent Vitals/Pain Pain Assessment: No/denies pain    Home Living                      Prior Function            PT Goals (current goals can now be found in the care plan section) Acute Rehab PT Goals Patient Stated Goal: get around better PT Goal Formulation: With patient Time For Goal Achievement: 05/26/18 Potential to Achieve Goals: Good Progress towards PT goals: Progressing toward goals    Frequency    Min 3X/week      PT Plan Current plan remains appropriate    Co-evaluation              AM-PAC PT "6 Clicks" Daily Activity  Outcome Measure  Difficulty turning over in bed (including adjusting bedclothes, sheets and blankets)?: None Difficulty moving from lying on back  to sitting on the side of the bed? : None Difficulty sitting down on and standing up from a chair with arms (e.g., wheelchair, bedside commode, etc,.)?: A Little Help needed moving to and from a bed to chair (including a wheelchair)?: None Help needed walking in hospital room?: A Little Help needed climbing 3-5 steps with a railing? : A Lot 6 Click Score: 20    End of Session Equipment Utilized During Treatment: Gait belt;Oxygen Activity Tolerance: Patient tolerated treatment well Patient left: in bed;with call bell/phone within reach Nurse Communication: Mobility status PT Visit Diagnosis: Unsteadiness on feet (R26.81);Difficulty in walking, not elsewhere classified (R26.2)     Time: 0626-9485 PT Time Calculation (min) (ACUTE ONLY): 20 min  Charges:  $Gait Training: 8-22 mins                     Lorrin Goodell, PT  Office # [phone] Pager [phone]    Lorriane Shire 05/14/2018, 12:53 PM

## 2018-05-14 NOTE — Progress Notes (Signed)
Patient remained stable throughout the shift. He had an uneventful night. This morning he had a low grade temp 100.2. Was given tylenol. Will continue to monitor.

## 2018-05-15 LAB — CBC
HCT: 26.3 % — ABNORMAL LOW (ref 39.0–52.0)
Hemoglobin: 7.9 g/dL — ABNORMAL LOW (ref 13.0–17.0)
MCH: 28.7 pg (ref 26.0–34.0)
MCHC: 30 g/dL (ref 30.0–36.0)
MCV: 95.6 fL (ref 80.0–100.0)
PLATELETS: 170 10*3/uL (ref 150–400)
RBC: 2.75 MIL/uL — ABNORMAL LOW (ref 4.22–5.81)
RDW: 17 % — AB (ref 11.5–15.5)
WBC: 7.8 10*3/uL (ref 4.0–10.5)
nRBC: 0.3 % — ABNORMAL HIGH (ref 0.0–0.2)

## 2018-05-15 LAB — RENAL FUNCTION PANEL
ALBUMIN: 2.6 g/dL — AB (ref 3.5–5.0)
Anion gap: 15 (ref 5–15)
BUN: 68 mg/dL — ABNORMAL HIGH (ref 8–23)
CHLORIDE: 97 mmol/L — AB (ref 98–111)
CO2: 22 mmol/L (ref 22–32)
Calcium: 7.3 mg/dL — ABNORMAL LOW (ref 8.9–10.3)
Creatinine, Ser: 14.53 mg/dL — ABNORMAL HIGH (ref 0.61–1.24)
GFR calc Af Amer: 4 mL/min — ABNORMAL LOW (ref >60)
GFR calc non Af Amer: 3 mL/min — ABNORMAL LOW (ref >60)
GLUCOSE: 123 mg/dL — AB (ref 70–99)
POTASSIUM: 3.8 mmol/L (ref 3.5–5.1)
Phosphorus: 2.1 mg/dL — ABNORMAL LOW (ref 2.5–4.6)
Sodium: 134 mmol/L — ABNORMAL LOW (ref 135–145)

## 2018-05-15 LAB — GLUCOSE, CAPILLARY
GLUCOSE-CAPILLARY: 144 mg/dL — AB (ref 70–99)
GLUCOSE-CAPILLARY: 133 mg/dL — AB (ref 70–99)
GLUCOSE-CAPILLARY: 134 mg/dL — AB (ref 70–99)
Glucose-Capillary: 97 mg/dL (ref 70–99)

## 2018-05-15 MED ORDER — HEPARIN SODIUM (PORCINE) 1000 UNIT/ML IJ SOLN
INTRAMUSCULAR | Status: AC
  Administered 2018-05-16: 4200 [IU] via INTRAVENOUS_CENTRAL
  Filled 2018-05-15: qty 5

## 2018-05-15 MED ORDER — HEPARIN SODIUM (PORCINE) 1000 UNIT/ML DIALYSIS
20.0000 [IU]/kg | INTRAMUSCULAR | Status: DC | PRN
  Administered 2018-05-15: 2100 [IU] via INTRAVENOUS_CENTRAL

## 2018-05-15 MED ORDER — SODIUM CHLORIDE 0.9 % IV SOLN
2.0000 g | INTRAVENOUS | Status: DC
  Administered 2018-05-16: 2 g via INTRAVENOUS
  Filled 2018-05-15 (×2): qty 2

## 2018-05-15 MED ORDER — HEPARIN SODIUM (PORCINE) 1000 UNIT/ML IJ SOLN
INTRAMUSCULAR | Status: AC
  Administered 2018-05-15: 2100 [IU] via INTRAVENOUS_CENTRAL
  Filled 2018-05-15: qty 3

## 2018-05-15 NOTE — Progress Notes (Signed)
[facility] PROGRESS NOTE  Patrick [name] YCX:[id number] DOB: 21-Jun-1956 DOA: 05/11/2018  PCP: Nolene Ebbs, MD  Brief History/Interval Summary: 62 y.o. male with medical history significant of CHF, coronary artery disease, ESRD on hemodialysis, hypertension, 2 diabetes presented to the hospital with a 2 to 3-day history of fevers, generalized weakness, and shortness of breath at home.  He has also been having a nonproductive cough.    Patient was noted to be hypoxic requiring 2 L of oxygen.  WBC was noted to be elevated.  Chest x-ray showed airspace disease in the left lung.  Concern was for healthcare associated pneumonia.  Patient was hospitalized and placed on broad-spectrum antibiotics.  Reason for Visit: HCAP  Consultants: Nephrology  Procedures: Hemodialysis  Antibiotics: Vancomycin and cefepime  Subjective/Interval History: Patient continues to feel about the same.  Denies any worsening in his shortness of breath.  No significant improvement either.  Continues to have a cough with whitish expectoration.  No further blood in the sputum.     ROS: Denies any headaches  Objective:  Vital Signs  Vitals:   05/14/18 1707 05/14/18 2034 05/15/18 0559 05/15/18 0919  BP: 111/69 107/70 123/79 133/63  Pulse: 97 99 84 84  Resp: 18 (!) 21 18 18   Temp: 99.3 F (37.4 C)  98.6 F (37 C) 98.1 F (36.7 C)  TempSrc: Oral     SpO2: 98% 99% 98% 97%  Weight:  103.4 kg    Height:        Intake/Output Summary (Last 24 hours) at 05/15/2018 0927 Last data filed at 05/15/2018 0600 Gross per 24 hour  Intake 600 ml  Output 0 ml  Net 600 ml   Filed Weights   05/13/18 1230 05/13/18 2114 05/14/18 2034  Weight: 103.3 kg 103.3 kg 103.4 kg    General appearance: Awake alert.  In no distress Resp: Continues to be tachypneic at rest.  Continues to have coarse breath sounds bilaterally.  Crackles noted left lung.  No wheezing or rhonchi. Cardio: S1-S2 is normal regular.  No S3-S4.   No rubs murmurs or bruit GI: Abdomen is soft.  Nontender nondistended.  Bowel sounds normal .  No masses organomegaly. Extremities: No significant edema Neurologic: Alert and oriented x3.  No obvious focal neurological deficits.  Lab Results:  Data Reviewed: I have personally reviewed following labs and imaging studies  CBC: Recent Labs  Lab 05/11/18 1735 05/12/18 0643 05/13/18 0837 05/14/18 0542  WBC 11.5* 13.1* 12.7* 9.8  NEUTROABS 9.6*  --   --   --   HGB 10.2* 10.2* 8.7* 8.4*  HCT 32.7* 33.8* 29.2* 27.9*  MCV 95.1 96.0 97.7 95.9  PLT 174 158 161 497    Basic Metabolic Panel: Recent Labs  Lab 05/11/18 1735 05/12/18 0643 05/13/18 0837  NA 134*  --  135  K 3.2*  --  3.9  CL 99  --  101  CO2 22  --  22  GLUCOSE 158*  --  114*  BUN 25*  --  49*  CREATININE 9.83*  --  14.26*  CALCIUM 10.2  --  8.3*  MG  --  1.9  --   PHOS  --   --  2.8    GFR: Estimated Creatinine Clearance: 6.1 mL/min (A) (by C-G formula based on SCr of 14.26 mg/dL (H)).  Liver Function Tests: Recent Labs  Lab 05/11/18 1735 05/13/18 0837  AST 34  --   ALT 12  --   ALKPHOS 53  --  BILITOT 1.0  --   PROT 7.3  --   ALBUMIN 3.4* 2.7*    CBG: Recent Labs  Lab 05/14/18 0810 05/14/18 1140 05/14/18 1704 05/14/18 2035 05/15/18 0736  GLUCAP 129* 111* 165* 139* 144*     Recent Results (from the past 240 hour(s))  Blood Culture (routine x 2)     Status: None (Preliminary result)   Collection Time: 05/11/18  5:13 PM  Result Value Ref Range Status   Specimen Description BLOOD LEFT HAND  Final   Special Requests   Final    BOTTLES DRAWN AEROBIC AND ANAEROBIC Blood Culture adequate volume   Culture   Final    NO GROWTH 3 DAYS Performed at [facility], [place] [address] [place], [place] [address]    Report Status PENDING  Incomplete  Blood Culture (routine x 2)     Status: None (Preliminary result)   Collection Time: 05/11/18  5:50 PM  Result Value Ref Range Status   Specimen  Description BLOOD RIGHT HAND  Final   Special Requests   Final    BOTTLES DRAWN AEROBIC AND ANAEROBIC Blood Culture adequate volume   Culture   Final    NO GROWTH 3 DAYS Performed at [facility], [place] [address], [place], [place] [address]    Report Status PENDING  Incomplete  MRSA PCR Screening     Status: Abnormal   Collection Time: 05/12/18  3:22 AM  Result Value Ref Range Status   MRSA by PCR POSITIVE (A) NEGATIVE Final    Comment:        The GeneXpert MRSA Assay (FDA approved for NASAL specimens only), is one component of a comprehensive MRSA colonization surveillance program. It is not intended to diagnose MRSA infection nor to guide or monitor treatment for MRSA infections. RESULT CALLED TO, READ BACK BY AND VERIFIED WITH: A MINTZ RN [phone] 05/12/18 A BROWNING Performed at [facility], [place] [address] [place], [place] [address]       Radiology Studies: Dg Chest 2 View  Result Date: 05/14/2018 CLINICAL DATA:  Cough and shortness of breath for 3 days. EXAM: CHEST - 2 VIEW COMPARISON:  Radiograph 3 days prior 05/11/2018 FINDINGS: Left-sided dialysis catheter tip at the atrial caval junction. Progressive opacities throughout the left hemithorax from prior exam. No focal airspace disease in the right hemithorax. Post median sternotomy. Unchanged cardiomegaly. Vascular stent in the region of the right subclavian/innominate. Suspect small left pleural effusion. No pneumothorax. IMPRESSION: Progressive patchy opacities throughout the left hemithorax over the past 3 days, suspicious for pneumonia. Suspect small left pleural effusion. Electronically Signed   By: Keith Rake M.D.   On: 05/14/2018 00:57     Medications:  Scheduled: . alteplase  4 mg Intracatheter Once per day on Tue Thu Sat  . aspirin EC  81 mg Oral Daily  . calcium acetate  1,334 mg Oral Q breakfast  . Chlorhexidine Gluconate Cloth  6 each Topical Q0600  . clopidogrel  75 mg Oral Daily  .  ezetimibe  10 mg Oral Daily  . feeding supplement (PRO-STAT SUGAR FREE 64)  30 mL Oral BID  . guaiFENesin  600 mg Oral BID  . heparin  5,000 Units Subcutaneous Q8H  . insulin aspart  0-9 Units Subcutaneous TID WC  . mouth rinse  15 mL Mouth Rinse BID  . multivitamin  1 tablet Oral QHS  . mupirocin ointment  1 application Nasal BID   Continuous: .  sodium chloride 1,000 mL (05/13/18 1426)  . ceFEPime (MAXIPIME) IV 2 g (05/13/18 1428)  . vancomycin Stopped (05/13/18 1255)   KXF:GHWEXH chloride, acetaminophen, albuterol, guaiFENesin, ondansetron (ZOFRAN) IV  Assessment/Plan:    Multifocal pneumonia possibly healthcare associated/acute respiratory failure with hypoxia Chest x-ray done on 10/17 did show worsening in the opacities in the left lung.  However patient's respiratory status remains stable.  He continues to require oxygen.  Continue Mucinex flutter valve.  Repeat chest x-ray tomorrow.  MRSA PCR was positive.  Blood cultures done here in the hospital are negative so far.  Apparently cultures done at the dialysis center were positive for gram-negative bacteria.  He remains on vancomycin and cefepime.  Continue current treatment for now.    Gram-negative bacteremia This was present at the cultures drawn at the dialysis center.  Await final identification and sensitivities.  Nephrology has discussed with ID.  Patient will need 2 weeks of treatment with Tressie Ellis which can be administered with hemodialysis.  He was febrile overnight on 10/17-18.  However fevers appears to have subsided over the last 24 hours.  May be able to salvage his dialysis catheter.   End-stage renal disease on hemodialysis on Tuesday Thursday Saturday Nephrology is following and managing.  Patient being dialyzed as per his usual schedule.  Chronic anemia likely due to kidney disease Drop in hemoglobin was noted on 10/17.  Hemoglobin stable subsequently.  No overt bleeding noted.  Essential  hypertension/hypotension Patient was noted to be hypotensive yesterday morning.  He was given a small fluid bolus with improvement.  Continue to hold his antihypertensives.  Continue to monitor closely.    Hyperlipidemia Continue home medications.  History of coronary artery disease Continue aspirin and Plavix.  History of combined systolic and diastolic CHF Based on echocardiogram from 2017 EF is 45 to 50%.  Grade 2 diastolic dysfunction was noted.  Volume being managed with hemodialysis.  Generalized deconditioning Most likely due to pneumonia.  Home health recommended by physical therapy.   DVT Prophylaxis: Subcutaneous heparin    Code Status: Full code Family Communication: Discussed with the patient. Disposition Plan: Management as outlined above.  Continue to wait on clinical improvement.    LOS: 4 days   [place] [facility] Pager [phone] 05/15/2018, 9:27 AM  If 7PM-7AM, please contact night-coverage at [email or web address], password [facility]

## 2018-05-15 NOTE — Progress Notes (Signed)
Called HD regarding treatment for tonight and giving antibiotic. Awaiting further directions.   Eleanora Neighbor, RN

## 2018-05-15 NOTE — Progress Notes (Addendum)
[facility] Progress Note   Subjective:  Seen in room, very winded s/p just going to the bathroom. Denies CP or abdominal pain. No fevers overnight. Still coughing, but maybe a little better overnight per patient. I was able to access the final Blood Cx results from the HD unit -- grew H. influenza -- no sensitives reported, says typically pan-sensitive to the usual respiratory abx.  Objective Vitals:   05/14/18 1707 05/14/18 2034 05/15/18 0559 05/15/18 0919  BP: 111/69 107/70 123/79 133/63  Pulse: 97 99 84 84  Resp: 18 (!) 21 18 18   Temp: 99.3 F (37.4 C)  98.6 F (37 C) 98.1 F (36.7 C)  TempSrc: Oral     SpO2: 98% 99% 98% 97%  Weight:  103.4 kg    Height:       Physical Exam General: Overweight man, NAD. + coughing. Heart: RRR; no murmur Lungs: Clear in upper lobes, faint rales in L base. Abdomen: soft, non-tender Extremities: No LE edema Dialysis Access: L TDC, no tenderness  Additional Objective Labs: Basic Metabolic Panel: Recent Labs  Lab 05/11/18 1735 05/13/18 0837  NA 134* 135  K 3.2* 3.9  CL 99 101  CO2 22 22  GLUCOSE 158* 114*  BUN 25* 49*  CREATININE 9.83* 14.26*  CALCIUM 10.2 8.3*  PHOS  --  2.8   Liver Function Tests: Recent Labs  Lab 05/11/18 1735 05/13/18 0837  AST 34  --   ALT 12  --   ALKPHOS 53  --   BILITOT 1.0  --   PROT 7.3  --   ALBUMIN 3.4* 2.7*   CBC: Recent Labs  Lab 05/11/18 1735 05/12/18 0643 05/13/18 0837 05/14/18 0542  WBC 11.5* 13.1* 12.7* 9.8  NEUTROABS 9.6*  --   --   --   HGB 10.2* 10.2* 8.7* 8.4*  HCT 32.7* 33.8* 29.2* 27.9*  MCV 95.1 96.0 97.7 95.9  PLT 174 158 161 172   Blood Culture    Component Value Date/Time   SDES BLOOD LEFT HAND 05/14/2018 1251   SPECREQUEST  05/14/2018 1251    BOTTLES DRAWN AEROBIC AND ANAEROBIC Blood Culture adequate volume   CULT  05/14/2018 1251    NO GROWTH < 24 HOURS Performed at [facility], [place] [address], [place], [place] [address]    REPTSTATUS  PENDING 05/14/2018 1251   CBG: Recent Labs  Lab 05/14/18 0810 05/14/18 1140 05/14/18 1704 05/14/18 2035 05/15/18 0736  GLUCAP 129* 111* 165* 139* 144*   Studies/Results: Dg Chest 2 View  Result Date: 05/14/2018 CLINICAL DATA:  Cough and shortness of breath for 3 days. EXAM: CHEST - 2 VIEW COMPARISON:  Radiograph 3 days prior 05/11/2018 FINDINGS: Left-sided dialysis catheter tip at the atrial caval junction. Progressive opacities throughout the left hemithorax from prior exam. No focal airspace disease in the right hemithorax. Post median sternotomy. Unchanged cardiomegaly. Vascular stent in the region of the right subclavian/innominate. Suspect small left pleural effusion. No pneumothorax. IMPRESSION: Progressive patchy opacities throughout the left hemithorax over the past 3 days, suspicious for pneumonia. Suspect small left pleural effusion. Electronically Signed   By: Keith Rake M.D.   On: 05/14/2018 00:57   Medications: . sodium chloride 1,000 mL (05/13/18 1426)  . ceFEPime (MAXIPIME) IV 2 g (05/13/18 1428)  . vancomycin Stopped (05/13/18 1255)   . alteplase  4 mg Intracatheter Once per day on Tue Thu Sat  . aspirin EC  81 mg Oral Daily  . calcium acetate  1,334 mg Oral Q breakfast  . Chlorhexidine Gluconate Cloth  6 each Topical Q0600  . clopidogrel  75 mg Oral Daily  . ezetimibe  10 mg Oral Daily  . feeding supplement (PRO-STAT SUGAR FREE 64)  30 mL Oral BID  . guaiFENesin  600 mg Oral BID  . heparin  5,000 Units Subcutaneous Q8H  . insulin aspart  0-9 Units Subcutaneous TID WC  . mouth rinse  15 mL Mouth Rinse BID  . multivitamin  1 tablet Oral QHS  . mupirocin ointment  1 application Nasal BID    Dialysis Orders: TTS at [facility] 4hr, 2K/3.5Ca, EDW 104.5kg, TDC, heparin 3000 bolus - Mircera 23mcg IV q 2 weeks (last given 10/15)  Assessment/Plan: 1. HCAP w/ Haemophilus influenzae bacteremia: outpt blood cx's from 10/18 HD grew H. Flu (no sensitives reported, says  typically pan-sensitive to the usual respiratory abx). CXR with L lung patchy airspace disease. WBC high on admit. Abx per primary team.  2. ESRD: Will continue HD per TTS schedule, next 10/19 today. Will not remove cath at this time.  3. HTN/volume: BP better today. No edema. 4. Anemia: Hgb 8.4, low. Not due for ESA yet. 5. Secondary hyperparathyroidism: Ca/Phos ok. Does not appear to be on VDRA/binders. 6. Nutrition: Alb low, continue pro-stat supplements. 7. CAD (Hx CABG)  Veneta Penton, PA-C 05/15/2018, 11:29 AM  [facility] Pager: [phone]  Pt seen, examined and agree w A/P as above.  Kelly Splinter MD [employer] pager [phone]   05/15/2018, 12:55 PM

## 2018-05-15 NOTE — Progress Notes (Signed)
Received a call from tele stating pt had 5 runs of v tach. Pt stated he was just moving in the bed and feels fine. Denies SOB, chest pain, or any discomfort.  Will continue to monitor.

## 2018-05-15 NOTE — Progress Notes (Signed)
Spoke with Herbert Spires and pt will be having treatment tonight.  Eleanora Neighbor, RN

## 2018-05-16 ENCOUNTER — Inpatient Hospital Stay (HOSPITAL_COMMUNITY): Payer: Medicare Other

## 2018-05-16 LAB — CULTURE, BLOOD (ROUTINE X 2)
CULTURE: NO GROWTH
Culture: NO GROWTH
Special Requests: ADEQUATE
Special Requests: ADEQUATE

## 2018-05-16 LAB — GLUCOSE, CAPILLARY
GLUCOSE-CAPILLARY: 122 mg/dL — AB (ref 70–99)
Glucose-Capillary: 132 mg/dL — ABNORMAL HIGH (ref 70–99)
Glucose-Capillary: 122 mg/dL — ABNORMAL HIGH (ref 70–99)
Glucose-Capillary: 151 mg/dL — ABNORMAL HIGH (ref 70–99)

## 2018-05-16 MED ORDER — ALTEPLASE 2 MG IJ SOLR
4.2000 mg | Freq: Once | INTRAMUSCULAR | Status: AC | PRN
  Administered 2018-05-16: 4.2 mg

## 2018-05-16 MED ORDER — DARBEPOETIN ALFA 100 MCG/0.5ML IJ SOSY
100.0000 ug | PREFILLED_SYRINGE | INTRAMUSCULAR | Status: DC
  Administered 2018-05-18: 100 ug via INTRAVENOUS

## 2018-05-16 MED ORDER — DEXTROSE 5 % IV SOLN
500.0000 mg | Freq: Once | INTRAVENOUS | Status: DC
  Filled 2018-05-16: qty 0.5

## 2018-05-16 MED ORDER — ALTEPLASE 2 MG IJ SOLR: 4.2000 mg | INTRAMUSCULAR | Status: DC

## 2018-05-16 MED ORDER — ALTEPLASE 2 MG IJ SOLR
INTRAMUSCULAR | Status: AC
  Administered 2018-05-16: 4.2 mg
  Filled 2018-05-16: qty 6

## 2018-05-16 MED ORDER — DARBEPOETIN ALFA 100 MCG/0.5ML IJ SOSY: 100.0000 ug | PREFILLED_SYRINGE | INTRAMUSCULAR | Status: DC

## 2018-05-16 MED ORDER — HEPARIN SODIUM (PORCINE) 1000 UNIT/ML DIALYSIS
4200.0000 [IU] | INTRAMUSCULAR | Status: DC | PRN
  Administered 2018-05-16: 4200 [IU] via INTRAVENOUS_CENTRAL
  Filled 2018-05-16: qty 4.2

## 2018-05-16 MED ORDER — CALCIUM ACETATE (PHOS BINDER) 667 MG PO CAPS: 667.0000 mg | ORAL_CAPSULE | Freq: Every day | ORAL | Status: DC

## 2018-05-16 NOTE — Progress Notes (Addendum)
[name] Progress Note   Subjective:  Seen in room, continues to improve clinically. Not requiring nasal oxygen this morning. Denies CP or dyspnea. Underwent dialysis overnight -- had a lot of catheter issues, only got 1/2 treatment without much UF (net 675mL).  Objective Vitals:   05/16/18 0136 05/16/18 0143 05/16/18 0230 05/16/18 0903  BP: 118/67 118/62 125/71 110/64  Pulse: 92 90 80 90  Resp: (!) 21 (!) 24  20  Temp:  98 F (36.7 C) 98.2 F (36.8 C) 98.5 F (36.9 C)  TempSrc:  Oral Oral Oral  SpO2: 92% 99% 99% 91%  Weight:  105 kg    Height:       Physical Exam General:Overweight man, NAD.  Heart:RRR; no murmur Lungs:Clear in upper lobes, faint bibasilar rales. Abdomen:soft, non-tender Extremities:No LE edema Dialysis Access:L TDC, no tenderness  Additional Objective Labs: Basic Metabolic Panel: Recent Labs  Lab 05/11/18 1735 05/13/18 0837 05/15/18 2150  NA 134* 135 134*  K 3.2* 3.9 3.8  CL 99 101 97*  CO2 22 22 22   GLUCOSE 158* 114* 123*  BUN 25* 49* 68*  CREATININE 9.83* 14.26* 14.53*  CALCIUM 10.2 8.3* 7.3*  PHOS  --  2.8 2.1*   Liver Function Tests: Recent Labs  Lab 05/11/18 1735 05/13/18 0837 05/15/18 2150  AST 34  --   --   ALT 12  --   --   ALKPHOS 53  --   --   BILITOT 1.0  --   --   PROT 7.3  --   --   ALBUMIN 3.4* 2.7* 2.6*   CBC: Recent Labs  Lab 05/11/18 1735 05/12/18 0643 05/13/18 0837 05/14/18 0542 05/15/18 2149  WBC 11.5* 13.1* 12.7* 9.8 7.8  NEUTROABS 9.6*  --   --   --   --   HGB 10.2* 10.2* 8.7* 8.4* 7.9*  HCT 32.7* 33.8* 29.2* 27.9* 26.3*  MCV 95.1 96.0 97.7 95.9 95.6  PLT 174 158 161 172 170   Blood Culture    Component Value Date/Time   SDES BLOOD LEFT HAND 05/14/2018 1251   SPECREQUEST  05/14/2018 1251    BOTTLES DRAWN AEROBIC AND ANAEROBIC Blood Culture adequate volume   CULT  05/14/2018 1251    NO GROWTH 1 DAY Performed at [facility], [place] [address] [place], [place]  [address]    REPTSTATUS PENDING 05/14/2018 1251   Studies/Results: Dg Chest 2 View  Result Date: 05/16/2018 CLINICAL DATA:  Pneumonia. EXAM: CHEST - 2 VIEW COMPARISON:  Two-view chest x-ray 05/14/2018 FINDINGS: Heart is enlarged. A left IJ dialysis catheter is in place. Aortic atherosclerosis present. Retrocardiac airspace disease is again seen there is no significant interval change. Small effusion is suspected. IMPRESSION: 1. Stable appearance of left lower lobe pneumonia. 2. Probable small left pleural effusion. Electronically Signed   By: San Morelle M.D.   On: 05/16/2018 08:49   Medications: . sodium chloride 1,000 mL (05/13/18 1426)  . ceFEPime (MAXIPIME) IV 2 g (05/16/18 0456)   . aspirin EC  81 mg Oral Daily  . calcium acetate  1,334 mg Oral Q breakfast  . Chlorhexidine Gluconate Cloth  6 each Topical Q0600  . clopidogrel  75 mg Oral Daily  . ezetimibe  10 mg Oral Daily  . feeding supplement (PRO-STAT SUGAR FREE 64)  30 mL Oral BID  . guaiFENesin  600 mg Oral BID  . heparin  5,000 Units Subcutaneous Q8H  . insulin aspart  0-9 Units Subcutaneous  TID WC  . mouth rinse  15 mL Mouth Rinse BID  . multivitamin  1 tablet Oral QHS  . mupirocin ointment  1 application Nasal BID    Dialysis Orders: TTS at [facility] 4hr, 2K/3.5Ca, EDW 104.5kg, TDC, heparin 3000 bolus - Mircera 78mcg IV q 2 weeks (last given 10/15)  Assessment/Plan: 1.HCAP w/ Haemophilus influenzae bacteremia: Outpt Blood Cx 10/18 HD grew H. Flu (no sensitives reported, says typically pan-sensitive to the usual respiratory abx). CXR with L lung patchy airspace disease; stable on repeat CXR 10/20. Vanc/Cefepime initially -> now Cefepime alone (will change to Ceftazidime on discharge for 2 week total treatment). 2. ESRD:TTS schedule, last 10/19 although only partial treatment d/t catheter issues. ?unclear if needs to be exchanged. Will plan short-HD tomorrow to "complete" last HD and to see if [facility] running ok, if not,  will sched exchange. 3.HTN/volume:BP controlled, no edema. . Anemia:Hgb 7.9, low. Will give extra dose Aranesp tomorrow (17mcg). 5. Secondary hyperparathyroidism:Ca/Phos ok. Does not appear to be on VDRA/binders. 6. Nutrition:Alb low, continue pro-stat supplements. 7. CAD (Hx CABG)  Veneta Penton, PA-C 05/16/2018, 10:36 AM  [facility] Pager: [phone]  Pt seen, examined and agree w A/P as above.  Kelly Splinter MD [employer] pager [phone]   05/16/2018, 12:28 PM

## 2018-05-16 NOTE — Progress Notes (Signed)
[facility] PROGRESS NOTE  Patrick [name] XTA:[id number] DOB: Mar 03, 1956 DOA: 05/11/2018  PCP: Nolene Ebbs, MD  Brief History/Interval Summary: 62 y.o. male with medical history significant of CHF, coronary artery disease, ESRD on hemodialysis, hypertension, 2 diabetes presented to the hospital with a 2 to 3-day history of fevers, generalized weakness, and shortness of breath at home.  He has also been having a nonproductive cough.    Patient was noted to be hypoxic requiring 2 L of oxygen.  WBC was noted to be elevated.  Chest x-ray showed airspace disease in the left lung.  Concern was for healthcare associated pneumonia.  Patient was hospitalized and placed on broad-spectrum antibiotics.  Reason for Visit: HCAP  Consultants: Nephrology  Procedures: Hemodialysis  Antibiotics: Vancomycin and cefepime  Subjective/Interval History: Patient denies any new complaints.  Continues to have a cough.  Shortness of breath is slightly better.  Denies any chest pain.  No nausea vomiting.  Some difficulty with his dialysis yesterday.    ROS: Denies any headaches  Objective:  Vital Signs  Vitals:   05/16/18 0136 05/16/18 0143 05/16/18 0230 05/16/18 0903  BP: 118/67 118/62 125/71 110/64  Pulse: 92 90 80 90  Resp: (!) 21 (!) 24  20  Temp:  98 F (36.7 C) 98.2 F (36.8 C) 98.5 F (36.9 C)  TempSrc:  Oral Oral Oral  SpO2: 92% 99% 99% 91%  Weight:  105 kg    Height:        Intake/Output Summary (Last 24 hours) at 05/16/2018 0941 Last data filed at 05/16/2018 0600 Gross per 24 hour  Intake 689.27 ml  Output 653 ml  Net 36.27 ml   Filed Weights   05/15/18 2032 05/15/18 2112 05/16/18 0143  Weight: 105.6 kg 105.6 kg 105 kg    General appearance: Awake alert.  In no distress Resp: Tachypnea has improved.  Improved breath sounds bilaterally.  Still diminished on the left with crackles.  No wheezing or rhonchi.   Cardio: S1-S2 is normal regular.  No S3-S4.  No rubs murmurs  or bruit GI: Abdomen remains soft.  Nontender nondistended.  Bowel sounds present normal.  No masses organomegaly Neurologic: No focal neurological deficits.  He is alert and oriented x3.  Lab Results:  Data Reviewed: I have personally reviewed following labs and imaging studies  CBC: Recent Labs  Lab 05/11/18 1735 05/12/18 0643 05/13/18 0837 05/14/18 0542 05/15/18 2149  WBC 11.5* 13.1* 12.7* 9.8 7.8  NEUTROABS 9.6*  --   --   --   --   HGB 10.2* 10.2* 8.7* 8.4* 7.9*  HCT 32.7* 33.8* 29.2* 27.9* 26.3*  MCV 95.1 96.0 97.7 95.9 95.6  PLT 174 158 161 172 655    Basic Metabolic Panel: Recent Labs  Lab 05/11/18 1735 05/12/18 0643 05/13/18 0837 05/15/18 2150  NA 134*  --  135 134*  K 3.2*  --  3.9 3.8  CL 99  --  101 97*  CO2 22  --  22 22  GLUCOSE 158*  --  114* 123*  BUN 25*  --  49* 68*  CREATININE 9.83*  --  14.26* 14.53*  CALCIUM 10.2  --  8.3* 7.3*  MG  --  1.9  --   --   PHOS  --   --  2.8 2.1*    GFR: Estimated Creatinine Clearance: 6 mL/min (A) (by C-G formula based on SCr of 14.53 mg/dL (H)).  Liver Function Tests: Recent Labs  Lab 05/11/18 1735  05/13/18 0837 05/15/18 2150  AST 34  --   --   ALT 12  --   --   ALKPHOS 53  --   --   BILITOT 1.0  --   --   PROT 7.3  --   --   ALBUMIN 3.4* 2.7* 2.6*    CBG: Recent Labs  Lab 05/15/18 0736 05/15/18 1149 05/15/18 1638 05/15/18 2032 05/16/18 0730  GLUCAP 144* 133* 97 134* 132*     Recent Results (from the past 240 hour(s))  Blood Culture (routine x 2)     Status: None (Preliminary result)   Collection Time: 05/11/18  5:13 PM  Result Value Ref Range Status   Specimen Description BLOOD LEFT HAND  Final   Special Requests   Final    BOTTLES DRAWN AEROBIC AND ANAEROBIC Blood Culture adequate volume   Culture   Final    NO GROWTH 4 DAYS Performed at [facility], [place] [address], [place], [place] [address]    Report Status PENDING  Incomplete  Blood Culture (routine x 2)     Status: None  (Preliminary result)   Collection Time: 05/11/18  5:50 PM  Result Value Ref Range Status   Specimen Description BLOOD RIGHT HAND  Final   Special Requests   Final    BOTTLES DRAWN AEROBIC AND ANAEROBIC Blood Culture adequate volume   Culture   Final    NO GROWTH 4 DAYS Performed at [facility], [place] [address] [place], [place] [address]    Report Status PENDING  Incomplete  MRSA PCR Screening     Status: Abnormal   Collection Time: 05/12/18  3:22 AM  Result Value Ref Range Status   MRSA by PCR POSITIVE (A) NEGATIVE Final    Comment:        The GeneXpert MRSA Assay (FDA approved for NASAL specimens only), is one component of a comprehensive MRSA colonization surveillance program. It is not intended to diagnose MRSA infection nor to guide or monitor treatment for MRSA infections. RESULT CALLED TO, READ BACK BY AND VERIFIED WITH: A MINTZ RN [phone] 05/12/18 A BROWNING Performed at [facility], [place] [address], [place], [place] [address]   Culture, blood (routine x 2)     Status: None (Preliminary result)   Collection Time: 05/14/18 12:51 PM  Result Value Ref Range Status   Specimen Description BLOOD LEFT HAND  Final   Special Requests   Final    BOTTLES DRAWN AEROBIC AND ANAEROBIC Blood Culture adequate volume   Culture   Final    NO GROWTH 1 DAY Performed at [facility], [place] [address], [place], [place] [address]    Report Status PENDING  Incomplete      Radiology Studies: Dg Chest 2 View  Result Date: 05/16/2018 CLINICAL DATA:  Pneumonia. EXAM: CHEST - 2 VIEW COMPARISON:  Two-view chest x-ray 05/14/2018 FINDINGS: Heart is enlarged. A left IJ dialysis catheter is in place. Aortic atherosclerosis present. Retrocardiac airspace disease is again seen there is no significant interval change. Small effusion is suspected. IMPRESSION: 1. Stable appearance of left lower lobe pneumonia. 2. Probable small left pleural effusion. Electronically Signed   By:  San Morelle M.D.   On: 05/16/2018 08:49     Medications:  Scheduled: . aspirin EC  81 mg Oral Daily  . calcium acetate  1,334 mg Oral Q breakfast  . Chlorhexidine Gluconate Cloth  6 each Topical Q0600  . clopidogrel  75 mg Oral Daily  . ezetimibe  10 mg Oral Daily  . feeding supplement (PRO-STAT SUGAR FREE 64)  30 mL Oral BID  . guaiFENesin  600 mg Oral BID  . heparin  5,000 Units Subcutaneous Q8H  . insulin aspart  0-9 Units Subcutaneous TID WC  . mouth rinse  15 mL Mouth Rinse BID  . multivitamin  1 tablet Oral QHS  . mupirocin ointment  1 application Nasal BID   Continuous: . sodium chloride 1,000 mL (05/13/18 1426)  . ceFEPime (MAXIPIME) IV 2 g (05/16/18 0456)  . vancomycin 1,000 mg (05/16/18 0530)   SPQ:ZRAQTM chloride, acetaminophen, albuterol, guaiFENesin, heparin, heparin, ondansetron (ZOFRAN) IV  Assessment/Plan:    Multifocal pneumonia possibly healthcare associated/acute respiratory failure with hypoxia Patient with significant pneumonia in the left lung.  Respiratory status stable to improved.  Patient has ambulated in the hallway without much difficulty.  Continues to have significant cough.  Chest x-ray repeated this morning and shows stable findings.  Most likely due to haemophilus influenzae.  We will stop his vancomycin.  Since he is bacteremic he will need IV antibiotics for 2 weeks.  This can be given with his dialysis.  Blood cultures done here in the hospital are negative so far.  Apparently cultures done at the dialysis center were positive for gram-negative bacteria.  Continue cefepime.  Wean down oxygen.  Hemophilus influenzae bacteremia This was present at the cultures drawn at the dialysis center. Patient will need 2 weeks of treatment with Tressie Ellis which can be administered with hemodialysis.  He was febrile overnight on 10/17-18.  Fever appears to have subsided.    End-stage renal disease on hemodialysis on Tuesday Thursday Saturday Nephrology  is following and managing.  Patient being dialyzed as per his usual schedule.  Encountered a lot of difficulty with his dialysis yesterday.  Per nephrology his catheter may need to be changed out.  Chronic anemia likely due to kidney disease Drop in hemoglobin was noted on 10/17.  No overt bleeding has been noted.  Recheck hemoglobin tomorrow.  Essential hypertension/hypotension Transient episode of hypotension on the morning of 10/18.  Improved with fluid bolus.  Blood pressure has been stable.  Continue to hold his antihypertensives.    Hyperlipidemia Continue home medications.  History of coronary artery disease Continue aspirin and Plavix.  History of combined systolic and diastolic CHF Based on echocardiogram from 2017 EF is 45 to 50%.  Grade 2 diastolic dysfunction was noted.  Volume being managed with hemodialysis.  Generalized deconditioning Most likely due to pneumonia.  Home health recommended by physical therapy.  Patient has been ambulating in the hallway.   DVT Prophylaxis: Subcutaneous heparin    Code Status: Full code Family Communication: Discussed with the patient. Disposition Plan: Management as outlined above.  Hopefully discharge in 1 to 2 days.    LOS: 5 days   [facility] Pager [phone] 05/16/2018, 9:41 AM  If 7PM-7AM, please contact night-coverage at [email or web address], password [facility]

## 2018-05-16 NOTE — Progress Notes (Signed)
HD tx initiated via HD cath w/cath function problems by Adela Lank, RN Bilat ports: VV:[id number] ok, VP: pull has drag, push/flush ok VSS Will continue to monitor while on HD tx

## 2018-05-16 NOTE — Progress Notes (Signed)
HD tx ended 1hr 39 min early d/t poor cath function. I exhausted all interventions trying to get tx completed as pt came to unit w/ labored breathing and I could tell he was struggling throughout tx. Lines reversed but not able to run reversed b/c there is too much drag on the pull of the VP., bilat ports flushed, new system set up to make sure the issue wasn't clotting of the lines/dialyzer, that didn't work, bilat ports TPA dwelled for 54 min and after TPA removed bilat ports dragged on pull. This nurse feels that w/ the constant spiking of the AP and the drag on pull that the cath is sucking up against the wall and is positional, HD cath may need to be pulled back and/or pt needs a new HD cath prior to next HD tx, UF goal not met, blood rinsed back, VSS, report called to Perry Mount, RN

## 2018-05-16 NOTE — Progress Notes (Signed)
Pharmacy Antibiotic Note  Patrick [name] is a 62 y.o. male who continues on Cefepime for H influenza bacteremia/HCAP. Pharmacy consulted to help with dosing.  The patient was noted to not tolerate HD well on 10/19 due to HD cath malfunctioning. Getting a repeat short HD session on 10/21 to "complete treatment" and further evaluation to see if the cath needs to be exchanged.   Plan: - Vanc d/ced - Cefepime 500 mg post extra HD session on 10/21 - Resume Cefepime 2g on TTS @ 1800 starting on 10/22 (assuming pt will get back on schedule) - Will continue to follow HD schedule/duration, culture results, LOT, and antibiotic de-escalation plans   Height: 5\' 7"  (170.2 cm) Weight: 231 lb 7.7 oz (105 kg) IBW/kg (Calculated) : 66.1  Temp (24hrs), Avg:98.2 F (36.8 C), Min:98 F (36.7 C), Max:98.5 F (36.9 C)  Recent Labs  Lab 05/11/18 1735 05/11/18 1742 05/11/18 1926 05/11/18 1936 05/12/18 0643 05/13/18 0837 05/14/18 0542 05/15/18 2149 05/15/18 2150  WBC 11.5*  --   --   --  13.1* 12.7* 9.8 7.8  --   CREATININE 9.83*  --   --   --   --  14.26*  --   --  14.53*  LATICACIDVEN  --  1.89  --  1.84  --   --   --   --   --   VANCOTROUGH  --   --  14*  --   --   --   --   --   --     Estimated Creatinine Clearance: 6 mL/min (A) (by C-G formula based on SCr of 14.53 mg/dL (H)).    Allergies  Allergen Reactions  . Iodinated Diagnostic Agents Swelling, Rash and Other (See Comments)    Other Reaction: burning to mouth, swelling of lips. Other Reaction: burning to mouth, swelling of lips.  . Lipitor [Atorvastatin] Other (See Comments)    Leg pain  . Metoprolol Other (See Comments)    Headaches     Antimicrobials this admission:   Vanc 10/15 (1st dose at [facility]) >>   Cefepime 10/15>>   CHG/Bactroban 10/16>>(10/20)  Dose adjustments this admission:     10/15: VR 14 mcg/ml (got Vanc x 1 PTA at [facility])  Microbiology results:   10/16 - Flu PCR negative   10/16 HIV  non-reactive   10/16 MRSA PCR positive   10/16 blood x 2 - ng x 3 days to date   10/15 outpt BCx - H influenza (pending sens)  Thank you for allowing pharmacy to be a part of this patient's care.  Alycia Rossetti, PharmD, BCPS Clinical Pharmacist Please check AMION for all [facility] numbers 05/16/2018 11:46 AM

## 2018-05-17 LAB — GLUCOSE, CAPILLARY
GLUCOSE-CAPILLARY: 179 mg/dL — AB (ref 70–99)
GLUCOSE-CAPILLARY: 133 mg/dL — AB (ref 70–99)
GLUCOSE-CAPILLARY: 136 mg/dL — AB (ref 70–99)
Glucose-Capillary: 81 mg/dL (ref 70–99)

## 2018-05-17 NOTE — Care Management Important Message (Signed)
Important Message  Patient Details  Name: Patrick [name] MRN: [id number] Date of Birth: 03/12/1956   Medicare Important Message Given:  Yes    Erenest Rasher, RN 05/17/2018, 1:12 PM

## 2018-05-17 NOTE — Progress Notes (Addendum)
Patrick [name] Progress Note   Subjective: Up in chair on room air. Still slightly breathless when he talks but says he feels much better.      Objective Vitals:   05/16/18 1709 05/16/18 2040 05/17/18 0618 05/17/18 0817  BP: 116/66 126/75 122/69 138/77  Pulse: 88 84 80 74  Resp: (!) 1 20 18 18   Temp: 98 F (36.7 C) 98.6 F (37 C) 98.6 F (37 C) 97.6 F (36.4 C)  TempSrc: Oral   Oral  SpO2: 93% 98% 99% 100%  Weight:      Height:       Physical Exam General: obese male in NAD Heart: S1,S2 RRR Lungs: Slightly decreased in bases,otherwise CTAB. no WOB.  Abdomen: Active BS Extremities: No LE edema Dialysis Access: [facility] CDI   Additional Objective Labs: Basic Metabolic Panel: Recent Labs  Lab 05/11/18 1735 05/13/18 0837 05/15/18 2150  NA 134* 135 134*  K 3.2* 3.9 3.8  CL 99 101 97*  CO2 22 22 22   GLUCOSE 158* 114* 123*  BUN 25* 49* 68*  CREATININE 9.83* 14.26* 14.53*  CALCIUM 10.2 8.3* 7.3*  PHOS  --  2.8 2.1*   Liver Function Tests: Recent Labs  Lab 05/11/18 1735 05/13/18 0837 05/15/18 2150  AST 34  --   --   ALT 12  --   --   ALKPHOS 53  --   --   BILITOT 1.0  --   --   PROT 7.3  --   --   ALBUMIN 3.4* 2.7* 2.6*   No results for input(s): LIPASE, AMYLASE in the last 168 hours. CBC: Recent Labs  Lab 05/11/18 1735 05/12/18 0643 05/13/18 0837 05/14/18 0542 05/15/18 2149  WBC 11.5* 13.1* 12.7* 9.8 7.8  NEUTROABS 9.6*  --   --   --   --   HGB 10.2* 10.2* 8.7* 8.4* 7.9*  HCT 32.7* 33.8* 29.2* 27.9* 26.3*  MCV 95.1 96.0 97.7 95.9 95.6  PLT 174 158 161 172 170   Blood Culture    Component Value Date/Time   SDES BLOOD LEFT HAND 05/14/2018 1251   SPECREQUEST  05/14/2018 1251    BOTTLES DRAWN AEROBIC AND ANAEROBIC Blood Culture adequate volume   CULT  05/14/2018 1251    NO GROWTH 3 DAYS Performed at [facility] [address], [place], [place] [address]    REPTSTATUS PENDING 05/14/2018 1251    Cardiac Enzymes: No  results for input(s): CKTOTAL, CKMB, CKMBINDEX, TROPONINI in the last 168 hours. CBG: Recent Labs  Lab 05/16/18 0730 05/16/18 1138 05/16/18 1632 05/16/18 2040 05/17/18 0815  GLUCAP 132* 122* 122* 151* 179*   Iron Studies: No results for input(s): IRON, TIBC, TRANSFERRIN, FERRITIN in the last 72 hours. @lablastinr3 @ Studies/Results: Dg Chest 2 View  Result Date: 05/16/2018 CLINICAL DATA:  Pneumonia. EXAM: CHEST - 2 VIEW COMPARISON:  Two-view chest x-ray 05/14/2018 FINDINGS: Heart is enlarged. A left IJ dialysis catheter is in place. Aortic atherosclerosis present. Retrocardiac airspace disease is again seen there is no significant interval change. Small effusion is suspected. IMPRESSION: 1. Stable appearance of left lower lobe pneumonia. 2. Probable small left pleural effusion. Electronically Signed   By: San Morelle M.D.   On: 05/16/2018 08:49   Medications: . sodium chloride 1,000 mL (05/13/18 1426)  . ceFEPime (MAXIPIME) IV 2 g (05/16/18 0456)   . aspirin EC  81 mg Oral Daily  . ceFEPime (MAXIPIME) IV  500 mg Intravenous Once  . Chlorhexidine  Gluconate Cloth  6 each Topical [id number]  . clopidogrel  75 mg Oral Daily  . [START ON 05/18/2018] darbepoetin (ARANESP) injection - DIALYSIS  100 mcg Intravenous Q Tue-HD  . ezetimibe  10 mg Oral Daily  . feeding supplement (PRO-STAT SUGAR FREE 64)  30 mL Oral BID  . guaiFENesin  600 mg Oral BID  . heparin  5,000 Units Subcutaneous Q8H  . insulin aspart  0-9 Units Subcutaneous TID WC  . mouth rinse  15 mL Mouth Rinse BID  . multivitamin  1 tablet Oral QHS     Dialysis Orders: TTS at [facility] 4hr, 2K/3.5Ca, EDW 104.5kg, TDC, heparin 3000 bolus - Mircera 20mcg IV q 2 weeks (last given 10/15)  Assessment/Plan: 1.HCAPw/ Haemophilus influenzae bacteremia: Outpt Blood Cx 10/18 HD grew H. Flu (no sensitives reported, says typically pan-sensitive to the usual respiratory abx).CXR with L lung patchy airspace disease; stable on repeat  CXR 10/20. Vanc/Cefepime initially -> now Cefepime alone (will change to Ceftazidime on discharge for 2 week total treatment). 2. ESRD:TTS schedule, last 10/19 although only partial treatment d/t catheter issues. ?unclear if needs to be exchanged. Will plan short-HD today to "complete" last HD and to see if [facility] running ok, if not, will sched exchange. 3.HTN/volume:BPcontrolled, no edema. . Anemia:Hgb 7.9, low. Will give extra dose Aranesp tomorrow (118mcg). Repeat HGB.  5. Secondary hyperparathyroidism:Phos 2.1  Ca 7.3.  Does not appear to be on VDRA/binders. 6. Nutrition:Alb 2.6,continuepro-stat supplements. 7. CAD (Hx CABG)  Rita H. Brown NP-C 05/17/2018, 9:50 AM  [employer] [phone]  I have seen and examined this patient and agree with plan and assessment in the above note with renal recommendations/intervention highlighted.  Feels much better today.  Will plan for extra tx today due to poorly functioning cath and sub-optimal dialysis.    Broadus John A Osie Amparo,MD 05/17/2018 1:53 PM

## 2018-05-17 NOTE — Progress Notes (Signed)
[facility] PROGRESS NOTE  NINA HOAR XTG:[id number] DOB: Nov 02, 1955 DOA: 05/11/2018  PCP: Nolene Ebbs, MD  Brief History/Interval Summary: 62 y.o. male with medical history significant of CHF, coronary artery disease, ESRD on hemodialysis, hypertension, 2 diabetes presented to the hospital with a 2 to 3-day history of fevers, generalized weakness, and shortness of breath at home.  He has also been having a nonproductive cough.    Patient was noted to be hypoxic requiring 2 L of oxygen.  WBC was noted to be elevated.  Chest x-ray showed airspace disease in the left lung.  Concern was for healthcare associated pneumonia.  Patient was hospitalized and placed on broad-spectrum antibiotics.  Reason for Visit: HCAP  Consultants: Nephrology  Procedures: Hemodialysis  Antibiotics: Vancomycin and cefepime Vancomycin was discontinued.  Remains on cefepime alone  Subjective/Interval History: Patient states that he feels much better.  Shortness of breath has improved.  Cough is much better.  Denies any nausea vomiting.     ROS: Denies any headaches  Objective:  Vital Signs  Vitals:   05/16/18 1709 05/16/18 2040 05/17/18 0618 05/17/18 0817  BP: 116/66 126/75 122/69 138/77  Pulse: 88 84 80 74  Resp: (!) 1 20 18 18   Temp: 98 F (36.7 C) 98.6 F (37 C) 98.6 F (37 C) 97.6 F (36.4 C)  TempSrc: Oral   Oral  SpO2: 93% 98% 99% 100%  Weight:      Height:        Intake/Output Summary (Last 24 hours) at 05/17/2018 0943 Last data filed at 05/17/2018 0201 Gross per 24 hour  Intake 360 ml  Output 0 ml  Net 360 ml   Filed Weights   05/15/18 2032 05/15/18 2112 05/16/18 0143  Weight: 105.6 kg 105.6 kg 105 kg    General appearance: Awake alert.  In no distress. Resp: Normal effort at rest.  Improved air entry bilaterally.  Few crackles in the left base.  No wheezing or rhonchi. Cardio: S1-S2 is normal regular.  No S3-S4.  No rubs murmurs or bruit GI: Abdomen soft.   Nontender nondistended  neurologic: Focal neurological deficit  Lab Results:  Data Reviewed: I have personally reviewed following labs and imaging studies  CBC: Recent Labs  Lab 05/11/18 1735 05/12/18 0643 05/13/18 0837 05/14/18 0542 05/15/18 2149  WBC 11.5* 13.1* 12.7* 9.8 7.8  NEUTROABS 9.6*  --   --   --   --   HGB 10.2* 10.2* 8.7* 8.4* 7.9*  HCT 32.7* 33.8* 29.2* 27.9* 26.3*  MCV 95.1 96.0 97.7 95.9 95.6  PLT 174 158 161 172 270    Basic Metabolic Panel: Recent Labs  Lab 05/11/18 1735 05/12/18 0643 05/13/18 0837 05/15/18 2150  NA 134*  --  135 134*  K 3.2*  --  3.9 3.8  CL 99  --  101 97*  CO2 22  --  22 22  GLUCOSE 158*  --  114* 123*  BUN 25*  --  49* 68*  CREATININE 9.83*  --  14.26* 14.53*  CALCIUM 10.2  --  8.3* 7.3*  MG  --  1.9  --   --   PHOS  --   --  2.8 2.1*    GFR: Estimated Creatinine Clearance: 6 mL/min (A) (by C-G formula based on SCr of 14.53 mg/dL (H)).  Liver Function Tests: Recent Labs  Lab 05/11/18 1735 05/13/18 0837 05/15/18 2150  AST 34  --   --   ALT 12  --   --  ALKPHOS 53  --   --   BILITOT 1.0  --   --   PROT 7.3  --   --   ALBUMIN 3.4* 2.7* 2.6*    CBG: Recent Labs  Lab 05/16/18 0730 05/16/18 1138 05/16/18 1632 05/16/18 2040 05/17/18 0815  GLUCAP 132* 122* 122* 151* 179*     Recent Results (from the past 240 hour(s))  Blood Culture (routine x 2)     Status: None   Collection Time: 05/11/18  5:13 PM  Result Value Ref Range Status   Specimen Description BLOOD LEFT HAND  Final   Special Requests   Final    BOTTLES DRAWN AEROBIC AND ANAEROBIC Blood Culture adequate volume   Culture   Final    NO GROWTH 5 DAYS Performed at [facility], [place] [address] [place], [place] [address]    Report Status 05/16/2018 FINAL  Final  Blood Culture (routine x 2)     Status: None   Collection Time: 05/11/18  5:50 PM  Result Value Ref Range Status   Specimen Description BLOOD RIGHT HAND  Final   Special Requests    Final    BOTTLES DRAWN AEROBIC AND ANAEROBIC Blood Culture adequate volume   Culture   Final    NO GROWTH 5 DAYS Performed at [facility], [place] [address], [place], [place] [address]    Report Status 05/16/2018 FINAL  Final  MRSA PCR Screening     Status: Abnormal   Collection Time: 05/12/18  3:22 AM  Result Value Ref Range Status   MRSA by PCR POSITIVE (A) NEGATIVE Final    Comment:        The GeneXpert MRSA Assay (FDA approved for NASAL specimens only), is one component of a comprehensive MRSA colonization surveillance program. It is not intended to diagnose MRSA infection nor to guide or monitor treatment for MRSA infections. RESULT CALLED TO, READ BACK BY AND VERIFIED WITH: A MINTZ RN [phone] 05/12/18 A BROWNING Performed at [facility], [place] [address] [place], [place] [address]   Culture, blood (routine x 2)     Status: None (Preliminary result)   Collection Time: 05/14/18 12:51 PM  Result Value Ref Range Status   Specimen Description BLOOD LEFT HAND  Final   Special Requests   Final    BOTTLES DRAWN AEROBIC AND ANAEROBIC Blood Culture adequate volume   Culture   Final    NO GROWTH 3 DAYS Performed at [facility], [place] [address], [place], [place] [address]    Report Status PENDING  Incomplete      Radiology Studies: Dg Chest 2 View  Result Date: 05/16/2018 CLINICAL DATA:  Pneumonia. EXAM: CHEST - 2 VIEW COMPARISON:  Two-view chest x-ray 05/14/2018 FINDINGS: Heart is enlarged. A left IJ dialysis catheter is in place. Aortic atherosclerosis present. Retrocardiac airspace disease is again seen there is no significant interval change. Small effusion is suspected. IMPRESSION: 1. Stable appearance of left lower lobe pneumonia. 2. Probable small left pleural effusion. Electronically Signed   By: San Morelle M.D.   On: 05/16/2018 08:49     Medications:  Scheduled: . aspirin EC  81 mg Oral Daily  . ceFEPime (MAXIPIME) IV  500 mg Intravenous  Once  . Chlorhexidine Gluconate Cloth  6 each Topical Q0600  . clopidogrel  75 mg Oral Daily  . [START ON 05/18/2018] darbepoetin (ARANESP) injection - DIALYSIS  100 mcg Intravenous Q Tue-HD  . ezetimibe  10 mg Oral Daily  . feeding supplement (PRO-STAT SUGAR FREE 64)  30 mL Oral BID  . guaiFENesin  600 mg Oral BID  . heparin  5,000 Units Subcutaneous Q8H  . insulin aspart  0-9 Units Subcutaneous TID WC  . mouth rinse  15 mL Mouth Rinse BID  . multivitamin  1 tablet Oral QHS   Continuous: . sodium chloride 1,000 mL (05/13/18 1426)  . ceFEPime (MAXIPIME) IV 2 g (05/16/18 0456)   GUR:KYHCWC chloride, acetaminophen, albuterol, guaiFENesin, ondansetron (ZOFRAN) IV  Assessment/Plan:    Multifocal pneumonia possibly healthcare associated/acute respiratory failure with hypoxia Patient with significant pneumonia in the left lung.  Patient was initially placed on vancomycin and cefepime.  Cultures have been negative here in the hospital.  Apparently his blood culture from the dialysis center grew Haemophilus influenzae.  Vancomycin was discontinued.  Cefepime to be continued.  He will get Tressie Ellis with the dialysis over the next 2 weeks.  Patient's respiratory status has significantly improved.  He is off of oxygen.  He will need a chest x-ray in 3 weeks.  Hemophilus influenzae bacteremia This was present at the cultures drawn at the dialysis center. Patient will need 2 weeks of treatment with Tressie Ellis which can be administered with hemodialysis.  He was febrile overnight on 10/17-18.  No further episodes of fever.  End-stage renal disease on hemodialysis on Tuesday Thursday Saturday Nephrology is following and managing.  Patient being dialyzed as per his usual schedule.  Encountered a lot of difficulty with his dialysis on 10/19.  Short course of dialysis today.  If there is continued problems with his catheter than this may have to be exchanged.  Chronic anemia likely due to kidney disease Some  drop in hemoglobin noted in the hospital.  No evidence of overt bleeding.  Recheck hemoglobin today.  Essential hypertension/hypotension Transient episode of hypotension on the morning of 10/18.  Improved with fluid bolus.  Blood pressure has been stable.  Continue to hold his antihypertensives.    Hyperlipidemia Continue home medications.  History of coronary artery disease Continue aspirin and Plavix.  History of combined systolic and diastolic CHF Based on echocardiogram from 2017 EF is 45 to 50%.  Grade 2 diastolic dysfunction was noted.  Volume being managed with hemodialysis.  Generalized deconditioning Most likely due to pneumonia.  Home health recommended by physical therapy.  Patient has been ambulating in the hallway.   DVT Prophylaxis: Subcutaneous heparin    Code Status: Full code Family Communication: Discussed with the patient. Disposition Plan: Management as outlined above.  Respiratory status has improved.  Discharge when cleared by nephrology.  His dialysis catheter may need to be exchanged.      LOS: 6 days   [place] [facility] [facility] Pager [phone] 05/17/2018, 9:43 AM  If 7PM-7AM, please contact night-coverage at [email or web address], password [facility]

## 2018-05-17 NOTE — Progress Notes (Signed)
Pt refused labs, going to HD this am.  Eleanora Neighbor, RN

## 2018-05-17 NOTE — Progress Notes (Signed)
Physical Therapy Treatment Patient Details Name: Patrick [name] MRN: [id number] DOB: 07-Oct-1955 Today's Date: 05/17/2018    History of Present Illness Pt is a 62 y.o. M with significant PMH of CHF, coronary artery disease, ESRD on hemodialysis, hypertension, type 2 diabetes presenting with 2-3 day history of fevers, generalized weakness, SOB. Chest x-ray showing new patchy airspace disease in left upper and lower lobes suspicious for multifocal pneumonia.     PT Comments    Pt progressing towards physical therapy goals. Was able to demonstrate improved independence with transfers. Appears unsteady and with R lateral lean during ambulation, also requiring standing rest breaks and utilization of rails for support at times in hall. Pt on RA throughout session and sats remained 91-93% throughout OOB mobility. Depending on tolerance for functional activity at d/c, pt may be able to progress to outpatient PT instead of HHPT. Will continue to follow and progress as able per POC.   Follow Up Recommendations  Home health PT     Equipment Recommendations  None recommended by PT    Recommendations for Other Services       Precautions / Restrictions Precautions Precautions: Fall Restrictions Weight Bearing Restrictions: No    Mobility  Bed Mobility Overal bed mobility: Modified Independent                Transfers Overall transfer level: Modified independent Equipment used: None Transfers: Sit to/from Stand           General transfer comment: Pt was able to power up to full stand without assistance. No unteadiness or LOB noted.   Ambulation/Gait Ambulation/Gait assistance: Supervision Gait Distance (Feet): 150 Feet Assistive device: None Gait Pattern/deviations: Step-through pattern;Decreased stride length Gait velocity: decreased Gait velocity interpretation: <1.31 ft/sec, indicative of household ambulator General Gait Details: Pt with R side lean and mild unsteadiness.  Overall at a supervision level but no assist required. Pt reports he feels his balance is off. Appears to fatigue quickly and utilizes railings in hallway for rest breaks.    Stairs             Wheelchair Mobility    Modified Rankin (Stroke Patients Only)       Balance Overall balance assessment: Mild deficits observed, not formally tested                                          Cognition Arousal/Alertness: Awake/alert Behavior During Therapy: WFL for tasks assessed/performed Overall Cognitive Status: Within Functional Limits for tasks assessed                                        Exercises      General Comments        Pertinent Vitals/Pain Pain Assessment: No/denies pain    Home Living                      Prior Function            PT Goals (current goals can now be found in the care plan section) Acute Rehab PT Goals Patient Stated Goal: Feels he is 60% better - wants to get to 100% PT Goal Formulation: With patient Time For Goal Achievement: 05/26/18 Potential to Achieve Goals: Good Progress towards PT goals: Progressing toward goals  Frequency    Min 3X/week      PT Plan Current plan remains appropriate    Co-evaluation              AM-PAC PT "6 Clicks" Daily Activity  Outcome Measure  Difficulty turning over in bed (including adjusting bedclothes, sheets and blankets)?: None Difficulty moving from lying on back to sitting on the side of the bed? : None Difficulty sitting down on and standing up from a chair with arms (e.g., wheelchair, bedside commode, etc,.)?: A Little Help needed moving to and from a bed to chair (including a wheelchair)?: None Help needed walking in hospital room?: A Little Help needed climbing 3-5 steps with a railing? : A Lot 6 Click Score: 20    End of Session Equipment Utilized During Treatment: Gait belt Activity Tolerance: Patient tolerated treatment  well Patient left: in bed;with call bell/phone within reach Nurse Communication: Mobility status PT Visit Diagnosis: Unsteadiness on feet (R26.81);Difficulty in walking, not elsewhere classified (R26.2)     Time: 7218-2883 PT Time Calculation (min) (ACUTE ONLY): 18 min  Charges:  $Gait Training: 8-22 mins                     Patrick [name], PT, DPT Acute Rehabilitation Services Pager: [phone] Office: [phone]    Patrick [name] 05/17/2018, 9:33 AM

## 2018-05-17 NOTE — Care Management Note (Signed)
Case Management Note  Patient Details  Name: Patrick [name] MRN: [id number] Date of Birth: 03/05/1956  Subjective/Objective:   HCAP, HD TTS                 Action/Plan: NCM spoke to pt and declines HH PT. States no DME needed. Lives at home with wife. Pt drives to his appts.   PCP Dr Nolene Ebbs Medications -no barriers     Expected Discharge Date:  05/17/18               Expected Discharge Plan:  [place]  In-House Referral:  NA  Discharge planning Services  CM Consult  Post Acute Care Choice:  Home Health Choice offered to:  Patient  DME Arranged:  N/A DME Agency:  NA  HH Arranged:  Patient Refused [place] Agency:  NA  Status of Service:  Completed, signed off  If discussed at [place] of Stay Meetings, dates discussed:    Additional Comments:  Erenest Rasher, RN 05/17/2018, 1:14 PM

## 2018-05-18 LAB — RENAL FUNCTION PANEL
ALBUMIN: 2.5 g/dL — AB (ref 3.5–5.0)
Anion gap: 17 — ABNORMAL HIGH (ref 5–15)
BUN: 85 mg/dL — ABNORMAL HIGH (ref 8–23)
CALCIUM: 7.3 mg/dL — AB (ref 8.9–10.3)
CO2: 20 mmol/L — ABNORMAL LOW (ref 22–32)
CREATININE: 15.38 mg/dL — AB (ref 0.61–1.24)
Chloride: 96 mmol/L — ABNORMAL LOW (ref 98–111)
GFR, EST AFRICAN AMERICAN: 3 mL/min — AB (ref >60)
GFR, EST NON AFRICAN AMERICAN: 3 mL/min — AB (ref >60)
Glucose, Bld: 129 mg/dL — ABNORMAL HIGH (ref 70–99)
PHOSPHORUS: 4.7 mg/dL — AB (ref 2.5–4.6)
Potassium: 3.6 mmol/L (ref 3.5–5.1)
SODIUM: 133 mmol/L — AB (ref 135–145)

## 2018-05-18 LAB — CBC
HCT: 25.2 % — ABNORMAL LOW (ref 39.0–52.0)
Hemoglobin: 7.9 g/dL — ABNORMAL LOW (ref 13.0–17.0)
MCH: 30 pg (ref 26.0–34.0)
MCHC: 31.3 g/dL (ref 30.0–36.0)
MCV: 95.8 fL (ref 80.0–100.0)
NRBC: 0 % (ref 0.0–0.2)
PLATELETS: 184 10*3/uL (ref 150–400)
RBC: 2.63 MIL/uL — AB (ref 4.22–5.81)
RDW: 17.3 % — ABNORMAL HIGH (ref 11.5–15.5)
WBC: 6.3 10*3/uL (ref 4.0–10.5)

## 2018-05-18 LAB — GLUCOSE, CAPILLARY
GLUCOSE-CAPILLARY: 109 mg/dL — AB (ref 70–99)
GLUCOSE-CAPILLARY: 93 mg/dL (ref 70–99)

## 2018-05-18 MED ORDER — HEPARIN SODIUM (PORCINE) 1000 UNIT/ML DIALYSIS
20.0000 [IU]/kg | INTRAMUSCULAR | Status: DC | PRN
  Administered 2018-05-18: 4200 [IU] via INTRAVENOUS_CENTRAL
  Administered 2018-05-18: 2100 [IU] via INTRAVENOUS_CENTRAL

## 2018-05-18 MED ORDER — RENA-VITE PO TABS: 1.0000 | ORAL_TABLET | Freq: Every day | ORAL | 0 refills | Status: DC

## 2018-05-18 MED ORDER — CEFTAZIDIME 2 G IV SOLR: INTRAVENOUS | 0 refills | Status: DC

## 2018-05-18 MED ORDER — GUAIFENESIN 100 MG/5ML PO SOLN: 5.0000 mL | ORAL | 0 refills | Status: DC | PRN

## 2018-05-18 MED ORDER — DARBEPOETIN ALFA 100 MCG/0.5ML IJ SOSY
PREFILLED_SYRINGE | INTRAMUSCULAR | Status: AC
  Administered 2018-05-18: 100 ug via INTRAVENOUS
  Filled 2018-05-18: qty 0.5

## 2018-05-18 MED ORDER — HEPARIN SODIUM (PORCINE) 1000 UNIT/ML IJ SOLN
INTRAMUSCULAR | Status: AC
  Administered 2018-05-18: 2100 [IU] via INTRAVENOUS_CENTRAL
  Filled 2018-05-18: qty 3

## 2018-05-18 MED ORDER — GUAIFENESIN ER 600 MG PO TB12: 600.0000 mg | ORAL_TABLET | Freq: Two times a day (BID) | ORAL | 0 refills | Status: DC

## 2018-05-18 NOTE — Progress Notes (Signed)
Hemodialysis Treatment Canceled for Today  Hemodialysis canceled for today by Dr. Johnney Ou Rescheduled for (date): 05/18/18 Patient and patient's nurse notified of schedule change at (time):Joyce, RN @ [phone] Orders written for today will be used for next scheduled hemodialysis treatment unless a new order is written.

## 2018-05-18 NOTE — Procedures (Signed)
I was present at this dialysis session. I have reviewed the session itself and made appropriate changes.   Vital signs in last 24 hours:  Temp:  [98.4 F (36.9 C)-98.7 F (37.1 C)] 98.5 F (36.9 C) (10/22 0545) Pulse Rate:  [82-93] 82 (10/22 0545) Resp:  [18] 18 (10/22 0545) BP: (130-150)/(73-80) 150/80 (10/22 0545) SpO2:  [98 %-100 %] 98 % (10/22 0545) Weight:  [105.4 kg] 105.4 kg (10/21 2238) Weight change:  Filed Weights   05/15/18 2112 05/16/18 0143 05/17/18 2238  Weight: 105.6 kg 105 kg 105.4 kg    Recent Labs  Lab 05/18/18 0712  NA 133*  K 3.6  CL 96*  CO2 20*  GLUCOSE 129*  BUN 85*  CREATININE 15.38*  CALCIUM 7.3*  PHOS 4.7*    Recent Labs  Lab 05/11/18 1735  05/14/18 0542 05/15/18 2149 05/18/18 0712  WBC 11.5*   < > 9.8 7.8 6.3  NEUTROABS 9.6*  --   --   --   --   HGB 10.2*   < > 8.4* 7.9* 7.9*  HCT 32.7*   < > 27.9* 26.3* 25.2*  MCV 95.1   < > 95.9 95.6 95.8  PLT 174   < > 172 170 184   < > = values in this interval not displayed.    Scheduled Meds: . aspirin EC  81 mg Oral Daily  . ceFEPime (MAXIPIME) IV  500 mg Intravenous Once  . Chlorhexidine Gluconate Cloth  6 each Topical Q0600  . clopidogrel  75 mg Oral Daily  . darbepoetin (ARANESP) injection - DIALYSIS  100 mcg Intravenous Q Tue-HD  . ezetimibe  10 mg Oral Daily  . feeding supplement (PRO-STAT SUGAR FREE 64)  30 mL Oral BID  . guaiFENesin  600 mg Oral BID  . heparin  5,000 Units Subcutaneous Q8H  . insulin aspart  0-9 Units Subcutaneous TID WC  . mouth rinse  15 mL Mouth Rinse BID  . multivitamin  1 tablet Oral QHS   Continuous Infusions: . sodium chloride 1,000 mL (05/13/18 1426)  . ceFEPime (MAXIPIME) IV 2 g (05/16/18 0456)   PRN Meds:.sodium chloride, acetaminophen, albuterol, guaiFENesin, heparin, ondansetron (ZOFRAN) IV     Dialysis Orders: TTS at [facility] 4hr, 2K/3.5Ca, EDW 104.5kg, TDC, heparin 3000 bolus - Mircera 78mcg IV q 2 weeks (last given 10/15)  Assessment and  Plan: 1. HCAP with H. Flu bacteremia- blood cultures negative since 05/11/18 2. ESRD- on TTS schedule. 3. HTN/Vol- stable 4. Vascular access- hd cath functioning fairly well today with BFR 350. 5. Anemia- Hgb trending down to 7.9.  Cont with ESA and hold off on transfusion for now. 6. Disposition- per primary.    Donetta Potts,  MD 05/18/2018, 8:41 AM

## 2018-05-18 NOTE — Progress Notes (Signed)
Patient discharged to home. Patient AVS reviewed and signed. Patient capable re-verbalizing medications and follow-up appointments. IV removed. Patient belongings sent with patient. Patient educated to return to the ED in the event of SOB, chest pain or dizziness.   Lakoda Mcanany B. RN 

## 2018-05-18 NOTE — Plan of Care (Signed)
  Problem: Education: Goal: Knowledge of General Education information will improve Description Including pain rating scale, medication(s)/side effects and non-pharmacologic comfort measures Outcome: Progressing Note:  POC reviewed with pt.; continue to update pt. on status of dialysis today.

## 2018-05-18 NOTE — Discharge Summary (Addendum)
[facility]  Physician Discharge Summary   Patient ID: Patrick [name] MRN: [id number] DOB/AGE: 1955-12-29 62 y.o.  Admit date: 05/11/2018 Discharge date: 05/18/2018  PCP: Nolene Ebbs, MD  DISCHARGE DIAGNOSES:  Pneumonia secondary to Haemophilus influenza End-stage renal disease on hemodialysis   RECOMMENDATIONS FOR OUTPATIENT FOLLOW UP: 1. Patient to get ceftazidime with dialysis for 2 weeks 2. Chest x-ray to be repeated in 3 weeks 3. Continue with usual hemodialysis schedule  DISCHARGE CONDITION: fair  Diet recommendation:  as before  Filed Weights   05/16/18 0143 05/17/18 2238 05/18/18 0653  Weight: 105 kg 105.4 kg 106 kg    INITIAL HISTORY: 62 y.o.malewith medical history significant ofCHF, coronary artery disease, ESRD on hemodialysis, hypertension, 2 diabetespresented to the hospital with a 2 to 3-day history of fevers, generalized weakness, and shortness of breath at home. He has also been having a nonproductive cough.   Patient was noted to be hypoxic requiring 2 L of oxygen.  WBC was noted to be elevated.  Chest x-ray showed airspace disease in the left lung.  Concern was for healthcare associated pneumonia.  Patient was hospitalized and placed on broad-spectrum antibiotics  Consultations:  . Nephrology  Procedures:  hemodialysis   HOSPITAL COURSE:    Multifocal pneumonia possibly healthcare associated/acute respiratory failure with hypoxia/Severe Sepsis  Patient with pneumonia in the left lung.  Patient was initially placed on vancomycin and cefepime.  Cultures have been negative here in the hospital.  Apparently his blood culture from the dialysis center grew Haemophilus influenzae.  Vancomycin was discontinued.  Cefepime was continued.  He will get ceftazidime with dialysis over the next 2 weeks.  atient's respiratory status has significantly improved.  He is off of oxygen. He, however, continues to have a significant cough. He should  continue with his cough medications. He will need a chest x-ray in 3 weeks. This can be done by his primary care provider.  Hemophilus influenzae bacteremia This was present at the cultures drawn at the dialysis center. Patient will need 2 weeks of treatment with Tressie Ellis which can be administered with hemodialysis.  He was febrile overnight on 10/17-18.  No further episodes of fever.  End-stage renal disease on hemodialysis on Tuesday Thursday Saturday Nephrology is following and managing.  Patient being dialyzed as per his usual schedule.  Encountered a lot of difficulty with his dialysis on 10/19.  However, no issues with dialysis today. This can be managed by his nephrologist in the outpatient setting.  Chronic anemia likely due to kidney disease Some drop in hemoglobin noted in the hospital.  No evidence of overt bleeding.   Essential hypertension/hypotension Transient episode of hypotension on the morning of 10/18.  Improved with fluid bolus.  Subsequently blood pressures have improved. He may resume his home medications.  Hyperlipidemia Continue home medications.  History of coronary artery disease Continue aspirin and Plavix.  History of combined systolic and diastolic CHF Based on echocardiogram from 2017 EF is 45 to 50%.  Grade 2 diastolic dysfunction was noted.  Volume being managed with hemodialysis.  Generalized deconditioning Most likely due to pneumonia.  Home health recommended by physical therapy.  Patient has been ambulating in the hallway.  Overall stable. Okay for discharge home today.     PERTINENT LABS:  The results of significant diagnostics from this hospitalization (including imaging, microbiology, ancillary and laboratory) are listed below for reference.    Microbiology: Recent Results (from the past 240 hour(s))  Blood Culture (routine x 2)  Status: None   Collection Time: 05/11/18  5:13 PM  Result Value Ref Range Status   Specimen  Description BLOOD LEFT HAND  Final   Special Requests   Final    BOTTLES DRAWN AEROBIC AND ANAEROBIC Blood Culture adequate volume   Culture   Final    NO GROWTH 5 DAYS Performed at [facility], [address] [place], [place] [address]    Report Status 05/16/2018 FINAL  Final  Blood Culture (routine x 2)     Status: None   Collection Time: 05/11/18  5:50 PM  Result Value Ref Range Status   Specimen Description BLOOD RIGHT HAND  Final   Special Requests   Final    BOTTLES DRAWN AEROBIC AND ANAEROBIC Blood Culture adequate volume   Culture   Final    NO GROWTH 5 DAYS Performed at [facility], [place] [address], [place], [place] [address]    Report Status 05/16/2018 FINAL  Final  MRSA PCR Screening     Status: Abnormal   Collection Time: 05/12/18  3:22 AM  Result Value Ref Range Status   MRSA by PCR POSITIVE (A) NEGATIVE Final    Comment:        The GeneXpert MRSA Assay (FDA approved for NASAL specimens only), is one component of a comprehensive MRSA colonization surveillance program. It is not intended to diagnose MRSA infection nor to guide or monitor treatment for MRSA infections. RESULT CALLED TO, READ BACK BY AND VERIFIED WITH: A MINTZ RN [phone] 05/12/18 A BROWNING Performed at [facility], [place] [address], [place], [place] [address]   Culture, blood (routine x 2)     Status: None (Preliminary result)   Collection Time: 05/14/18 12:51 PM  Result Value Ref Range Status   Specimen Description BLOOD LEFT HAND  Final   Special Requests   Final    BOTTLES DRAWN AEROBIC AND ANAEROBIC Blood Culture adequate volume   Culture   Final    NO GROWTH 4 DAYS Performed at [facility], [place] [address] [place] [address], [place], [place] [address]    Report Status PENDING  Incomplete     Labs: Basic Metabolic Panel: Recent Labs  Lab 05/11/18 1735 05/12/18 0643 05/13/18 0837 05/15/18 2150 05/18/18 0712  NA 134*  --  135 134* 133*  K 3.2*  --  3.9 3.8 3.6  CL 99  --   101 97* 96*  CO2 22  --  22 22 20*  GLUCOSE 158*  --  114* 123* 129*  BUN 25*  --  49* 68* 85*  CREATININE 9.83*  --  14.26* 14.53* 15.38*  CALCIUM 10.2  --  8.3* 7.3* 7.3*  MG  --  1.9  --   --   --   PHOS  --   --  2.8 2.1* 4.7*   Liver Function Tests: Recent Labs  Lab 05/11/18 1735 05/13/18 0837 05/15/18 2150 05/18/18 0712  AST 34  --   --   --   ALT 12  --   --   --   ALKPHOS 53  --   --   --   BILITOT 1.0  --   --   --   PROT 7.3  --   --   --   ALBUMIN 3.4* 2.7* 2.6* 2.5*   CBC: Recent Labs  Lab 05/11/18 1735 05/12/18 0643 05/13/18 0837 05/14/18 0542 05/15/18 2149 05/18/18 0712  WBC 11.5* 13.1* 12.7* 9.8 7.8 6.3  NEUTROABS 9.6*  --   --   --   --   --   HGB 10.2* 10.2* 8.7* 8.4* 7.9* 7.9*  HCT 32.7* 33.8* 29.2* 27.9* 26.3* 25.2*  MCV 95.1 96.0 97.7 95.9 95.6 95.8  PLT 174 158 161 172 170 184    CBG: Recent Labs  Lab 05/17/18 0815 05/17/18 1207 05/17/18 1720 05/17/18 2237 05/18/18 0822  GLUCAP 179* 81 133* 136* 109*     IMAGING STUDIES Ct Abdomen Pelvis Wo Contrast  Result Date: 04/27/2018 CLINICAL DATA:  62 year old male with end-stage renal disease. Prior kidney transplant. History of umbilical hernia repair now with tenderness on right side. Initial encounter. EXAM: CT ABDOMEN AND PELVIS WITHOUT CONTRAST TECHNIQUE: Multidetector CT imaging of the abdomen and pelvis was performed following the standard protocol without IV contrast. COMPARISON:  11/22/2015 CT. FINDINGS: Lower chest: Mild scarring lung bases. Base of heart of normal size. Prominent coronary artery and mitral valve calcifications. Epicardial fat. Hepatobiliary: Taking into account limitation by non contrast imaging, no worrisome hepatic lesion. No calcified gallstone. No CT evidence of gallbladder inflammation. Pancreas: Taking into account limitation by non contrast imaging, no worrisome pancreatic mass or inflammation. Spleen: Taking into account limitation by non contrast imaging, no  splenic mass or enlargement. Adrenals/Urinary Tract: Multiple cysts throughout both kidneys. Majority of these cysts appear to be simple cysts however several cannot be confirmed as simple cysts. Additionally, with distortion of the renal parenchyma and lack of oral contrast, difficult to exclude a renal lesion. No hydronephrosis. Post removal of right pelvic transplant kidney with scarring in this region. No adrenal lesion. Contracted urinary bladder. Stomach/Bowel: Diverticulosis most notable descending colon and sigmoid colon without extraluminal bowel inflammatory process. Prior appendectomy per history. Small hiatal hernia. Slightly under distended stomach without gross abnormality noted. Small bowel enters larger right anterior abdominal/pelvic wall hernia. Vascular/Lymphatic: Prominent atherosclerotic plaque throughout the aorta and aortic branch vessels. No abdominal aortic aneurysm. Atherosclerotic changes iliac arteries with significant narrowing left common iliac artery. Scattered normal size lymph nodes. Reproductive: Slightly asymmetric prostate gland/seminal vesicles. Other: 9.4 x 5.4 x 8.2 cm lenticular shaped complex collection right paracentral lower anterior abdominal/pelvic wall collection suspicious for abscess with overlying mild cellulitis. In addition to large right paracentral lower abdominal/pelvic wall small bowel fat and vessel containing hernia, there is a left paracentral fat and vessel containing 2 x 3 x 2.5 cm hernia with 2.5 x 2 cm rent. Musculoskeletal: Possible early avascular necrosis left femoral head without collapse. Mild degenerative changes lower thoracic and lumbar spine. Sacroiliac joint degenerative changes. IMPRESSION: 1. 9.4 x 5.4 x 8.2 cm lenticular shaped complex collection right paracentral lower anterior abdominal/pelvic wall collection suspicious for abscess with overlying mild cellulitis. 2. Multiple cysts throughout both kidneys. Majority of these cysts appear to  be simple cysts however several cannot be confirmed as simple cysts. Additionally, with distortion of the renal parenchyma and lack of oral contrast, difficult to exclude a renal lesion. Post removal of right pelvic transplant kidney with scarring in this region. 3. Diverticulosis most notable descending colon and sigmoid colon without extraluminal bowel inflammatory process. 4. Prior appendectomy per history. 5. Small hiatal hernia. 6. Small bowel enters large right anterior abdominal/pelvic wall hernia. 7. Lower left anterior abdominal wall 2 x 3 x 2.5 cm fat and vessel containing hernia. 8. Aortic Atherosclerosis (ICD10-I70.0). Prominent atherosclerotic changes iliac arteries with significant narrowing left common iliac artery. Coronary artery calcifications. 9. Possible or early avascular necrosis left femoral head. These results will  be called to the ordering clinician or representative by the Radiologist Assistant, and communication documented in the PACS or zVision Dashboard. Electronically Signed   By: Genia Del M.D.   On: 04/27/2018 07:10   Dg Chest 2 View  Result Date: 05/16/2018 CLINICAL DATA:  Pneumonia. EXAM: CHEST - 2 VIEW COMPARISON:  Two-view chest x-ray 05/14/2018 FINDINGS: Heart is enlarged. A left IJ dialysis catheter is in place. Aortic atherosclerosis present. Retrocardiac airspace disease is again seen there is no significant interval change. Small effusion is suspected. IMPRESSION: 1. Stable appearance of left lower lobe pneumonia. 2. Probable small left pleural effusion. Electronically Signed   By: San Morelle M.D.   On: 05/16/2018 08:49   Dg Chest 2 View  Result Date: 05/14/2018 CLINICAL DATA:  Cough and shortness of breath for 3 days. EXAM: CHEST - 2 VIEW COMPARISON:  Radiograph 3 days prior 05/11/2018 FINDINGS: Left-sided dialysis catheter tip at the atrial caval junction. Progressive opacities throughout the left hemithorax from prior exam. No focal airspace disease  in the right hemithorax. Post median sternotomy. Unchanged cardiomegaly. Vascular stent in the region of the right subclavian/innominate. Suspect small left pleural effusion. No pneumothorax. IMPRESSION: Progressive patchy opacities throughout the left hemithorax over the past 3 days, suspicious for pneumonia. Suspect small left pleural effusion. Electronically Signed   By: Keith Rake M.D.   On: 05/14/2018 00:57   Dg Chest 2 View  Result Date: 05/11/2018 CLINICAL DATA:  Cough and fever. EXAM: CHEST - 2 VIEW COMPARISON:  Chest x-ray dated Dec 15, 2017. FINDINGS: Unchanged tunneled left internal jugular dialysis catheter with the tip in the right atrium. Stable cardiomegaly. Normal pulmonary vascularity. New patchy airspace disease in the left upper and lower lobes. No pleural effusion or pneumothorax. No acute osseous abnormality. IMPRESSION: Patchy airspace disease in the left upper and lower lobes, suspicious for multifocal pneumonia given clinical history. Followup PA and lateral chest X-ray is recommended in 3-4 weeks following trial of antibiotic therapy to ensure resolution. Electronically Signed   By: Titus Dubin M.D.   On: 05/11/2018 18:34    DISCHARGE EXAMINATION: Vitals:   05/18/18 0800 05/18/18 0830 05/18/18 0900 05/18/18 1135  BP: (!) 142/86 (!) 101/57 137/79 (!) 145/80  Pulse: 80 81 81 93  Resp:    18  Temp:    98.3 F (36.8 C)  TempSrc:    Oral  SpO2:    96%  Weight:      Height:       General appearance: alert, cooperative, appears stated age and no distress Head: Normocephalic, without obvious abnormality, atraumatic Resp: normal effort. Improved aeration bilaterally. Still diminished in the left base. Few crackles. No wheezing or rhonchi. Cardio: regular rate and rhythm, S1, S2 normal, no murmur, click, rub or gallop GI: soft, non-tender; bowel sounds normal; no masses,  no organomegaly   DISPOSITION: home with home health  Discharge Instructions    Call MD  for:  difficulty breathing, headache or visual disturbances   Complete by:  As directed    Call MD for:  extreme fatigue   Complete by:  As directed    Call MD for:  persistant dizziness or light-headedness   Complete by:  As directed    Call MD for:  persistant nausea and vomiting   Complete by:  As directed    Call MD for:  severe uncontrolled pain   Complete by:  As directed    Call MD for:  temperature >100.4   Complete  by:  As directed    Diet Carb Modified   Complete by:  As directed    Discharge instructions   Complete by:  As directed    Please go for your dialysis as per usual schedule.  Take your medications.  You will need to undergo a repeat chest x-ray in 3 weeks time.  Talk to your primary care provider about this.  You were cared for by a hospitalist during your hospital stay. If you have any questions about your discharge medications or the care you received while you were in the hospital after you are discharged, you can call the unit and asked to speak with the hospitalist on call if the hospitalist that took care of you is not available. Once you are discharged, your primary care physician will handle any further medical issues. Please note that NO REFILLS for any discharge medications will be authorized once you are discharged, as it is imperative that you return to your primary care physician (or establish a relationship with a primary care physician if you do not have one) for your aftercare needs so that they can reassess your need for medications and monitor your lab values. If you do not have a primary care physician, you can call [phone] for a physician referral.   Increase activity slowly   Complete by:  As directed         Allergies as of 05/18/2018      Reactions   Iodinated Diagnostic Agents Swelling, Rash, Other (See Comments)   Other Reaction: burning to mouth, swelling of lips. Other Reaction: burning to mouth, swelling of lips.   Lipitor  [atorvastatin] Other (See Comments)   Leg pain   Metoprolol Other (See Comments)   Headaches       Medication List    STOP taking these medications   rosuvastatin 10 MG tablet Commonly known as:  CRESTOR     TAKE these medications   acetaminophen 500 MG tablet Commonly known as:  TYLENOL Take 1,000 mg by mouth every 6 (six) hours as needed for moderate pain or headache.   albuterol 108 (90 Base) MCG/ACT inhaler Commonly known as:  PROVENTIL HFA;VENTOLIN HFA Inhale 1-2 puffs into the lungs every 6 (six) hours as needed for wheezing or shortness of breath.   amLODipine 5 MG tablet Commonly known as:  NORVASC Take 1 tablet (5 mg total) by mouth daily.   aspirin EC 81 MG tablet Take 81 mg by mouth daily.   calcium acetate 667 MG capsule Commonly known as:  PHOSLO Take 1,334 mg by mouth daily at 12 noon.   Ceftazidime 2 g Solr injection Commonly known as:  FORTAZ To be given with dialysis for 2 weeks   clopidogrel 75 MG tablet Commonly known as:  PLAVIX Take 1 tablet (75 mg total) by mouth daily.   ezetimibe 10 MG tablet Commonly known as:  ZETIA Take 1 tablet (10 mg total) by mouth daily.   guaiFENesin 600 MG 12 hr tablet Commonly known as:  MUCINEX Take 1 tablet (600 mg total) by mouth 2 (two) times daily.   guaiFENesin 100 MG/5ML Soln Commonly known as:  ROBITUSSIN Take 5 mLs (100 mg total) by mouth every 4 (four) hours as needed for cough or to loosen phlegm.   isosorbide mononitrate 60 MG 24 hr tablet Commonly known as:  IMDUR Take 1.5 tablets (90 mg total) by mouth daily.   multivitamin Tabs tablet Take 1 tablet by mouth at bedtime.  predniSONE 50 MG tablet Commonly known as:  DELTASONE Take 12 hours prior to cath, 6 hours prior and 1 hour prior   repaglinide 0.5 MG tablet Commonly known as:  PRANDIN Take 1 tablet (0.5 mg total) by mouth 2 (two) times daily before a meal.   traMADol 50 MG tablet Commonly known as:  ULTRAM Take 1 tablet (50 mg  total) by mouth every 6 (six) hours as needed for moderate pain.        Follow-up Information    Nolene Ebbs, MD. Schedule an appointment as soon as possible for a visit in 3 week(s).   Specialty:  Internal Medicine Why:  To get a repeat chest x-ray in 3 weeks Contact information: [place] [address] [phone]           TOTAL DISCHARGE TIME: 35 mins  Bonnielee Haff  [facility] Pager [phone]  05/18/2018, 1:02 PM

## 2018-05-18 NOTE — Discharge Instructions (Signed)

## 2018-05-19 LAB — CULTURE, BLOOD (ROUTINE X 2)
Culture: NO GROWTH
Special Requests: ADEQUATE

## 2018-05-20 DIAGNOSIS — E1029 Type 1 diabetes mellitus with other diabetic kidney complication: Secondary | ICD-10-CM | POA: Diagnosis not present

## 2018-05-20 DIAGNOSIS — R7881 Bacteremia: Secondary | ICD-10-CM | POA: Diagnosis not present

## 2018-05-20 DIAGNOSIS — D689 Coagulation defect, unspecified: Secondary | ICD-10-CM | POA: Diagnosis not present

## 2018-05-20 DIAGNOSIS — N186 End stage renal disease: Secondary | ICD-10-CM | POA: Diagnosis not present

## 2018-05-20 DIAGNOSIS — I4891 Unspecified atrial fibrillation: Secondary | ICD-10-CM | POA: Diagnosis not present

## 2018-05-20 DIAGNOSIS — J189 Pneumonia, unspecified organism: Secondary | ICD-10-CM | POA: Diagnosis not present

## 2018-05-20 DIAGNOSIS — R509 Fever, unspecified: Secondary | ICD-10-CM | POA: Diagnosis not present

## 2018-05-22 DIAGNOSIS — R509 Fever, unspecified: Secondary | ICD-10-CM | POA: Diagnosis not present

## 2018-05-22 DIAGNOSIS — N186 End stage renal disease: Secondary | ICD-10-CM | POA: Diagnosis not present

## 2018-05-22 DIAGNOSIS — J189 Pneumonia, unspecified organism: Secondary | ICD-10-CM | POA: Diagnosis not present

## 2018-05-22 DIAGNOSIS — D689 Coagulation defect, unspecified: Secondary | ICD-10-CM | POA: Diagnosis not present

## 2018-05-22 DIAGNOSIS — R7881 Bacteremia: Secondary | ICD-10-CM | POA: Diagnosis not present

## 2018-05-25 DIAGNOSIS — R7881 Bacteremia: Secondary | ICD-10-CM | POA: Diagnosis not present

## 2018-05-25 DIAGNOSIS — R509 Fever, unspecified: Secondary | ICD-10-CM | POA: Diagnosis not present

## 2018-05-25 DIAGNOSIS — N186 End stage renal disease: Secondary | ICD-10-CM | POA: Diagnosis not present

## 2018-05-25 DIAGNOSIS — D689 Coagulation defect, unspecified: Secondary | ICD-10-CM | POA: Diagnosis not present

## 2018-05-25 DIAGNOSIS — J189 Pneumonia, unspecified organism: Secondary | ICD-10-CM | POA: Diagnosis not present

## 2018-05-27 ENCOUNTER — Other Ambulatory Visit: Payer: Self-pay | Admitting: *Deleted

## 2018-05-27 DIAGNOSIS — D689 Coagulation defect, unspecified: Secondary | ICD-10-CM | POA: Diagnosis not present

## 2018-05-27 DIAGNOSIS — R509 Fever, unspecified: Secondary | ICD-10-CM | POA: Diagnosis not present

## 2018-05-27 DIAGNOSIS — N186 End stage renal disease: Secondary | ICD-10-CM | POA: Diagnosis not present

## 2018-05-27 DIAGNOSIS — R7881 Bacteremia: Secondary | ICD-10-CM | POA: Diagnosis not present

## 2018-05-27 DIAGNOSIS — J189 Pneumonia, unspecified organism: Secondary | ICD-10-CM | POA: Diagnosis not present

## 2018-05-28 DIAGNOSIS — N186 End stage renal disease: Secondary | ICD-10-CM | POA: Diagnosis not present

## 2018-05-28 DIAGNOSIS — Z992 Dependence on renal dialysis: Secondary | ICD-10-CM | POA: Diagnosis not present

## 2018-05-28 DIAGNOSIS — T8610 Unspecified complication of kidney transplant: Secondary | ICD-10-CM | POA: Diagnosis not present

## 2018-05-29 DIAGNOSIS — D631 Anemia in chronic kidney disease: Secondary | ICD-10-CM | POA: Diagnosis not present

## 2018-05-29 DIAGNOSIS — R52 Pain, unspecified: Secondary | ICD-10-CM | POA: Diagnosis not present

## 2018-05-29 DIAGNOSIS — N2581 Secondary hyperparathyroidism of renal origin: Secondary | ICD-10-CM | POA: Diagnosis not present

## 2018-05-29 DIAGNOSIS — A4189 Other specified sepsis: Secondary | ICD-10-CM | POA: Diagnosis not present

## 2018-05-29 DIAGNOSIS — E1029 Type 1 diabetes mellitus with other diabetic kidney complication: Secondary | ICD-10-CM | POA: Diagnosis not present

## 2018-05-29 DIAGNOSIS — R509 Fever, unspecified: Secondary | ICD-10-CM | POA: Diagnosis not present

## 2018-05-29 DIAGNOSIS — J14 Pneumonia due to Hemophilus influenzae: Secondary | ICD-10-CM | POA: Diagnosis not present

## 2018-05-29 DIAGNOSIS — N186 End stage renal disease: Secondary | ICD-10-CM | POA: Diagnosis not present

## 2018-05-29 DIAGNOSIS — Z992 Dependence on renal dialysis: Secondary | ICD-10-CM | POA: Diagnosis not present

## 2018-06-01 DIAGNOSIS — N186 End stage renal disease: Secondary | ICD-10-CM | POA: Diagnosis not present

## 2018-06-01 DIAGNOSIS — A4189 Other specified sepsis: Secondary | ICD-10-CM | POA: Diagnosis not present

## 2018-06-01 DIAGNOSIS — N2581 Secondary hyperparathyroidism of renal origin: Secondary | ICD-10-CM | POA: Diagnosis not present

## 2018-06-01 DIAGNOSIS — R509 Fever, unspecified: Secondary | ICD-10-CM | POA: Diagnosis not present

## 2018-06-01 DIAGNOSIS — Z992 Dependence on renal dialysis: Secondary | ICD-10-CM | POA: Diagnosis not present

## 2018-06-01 DIAGNOSIS — R52 Pain, unspecified: Secondary | ICD-10-CM | POA: Diagnosis not present

## 2018-06-03 DIAGNOSIS — N2581 Secondary hyperparathyroidism of renal origin: Secondary | ICD-10-CM | POA: Diagnosis not present

## 2018-06-03 DIAGNOSIS — N186 End stage renal disease: Secondary | ICD-10-CM | POA: Diagnosis not present

## 2018-06-03 DIAGNOSIS — R509 Fever, unspecified: Secondary | ICD-10-CM | POA: Diagnosis not present

## 2018-06-03 DIAGNOSIS — A4189 Other specified sepsis: Secondary | ICD-10-CM | POA: Diagnosis not present

## 2018-06-03 DIAGNOSIS — Z992 Dependence on renal dialysis: Secondary | ICD-10-CM | POA: Diagnosis not present

## 2018-06-03 DIAGNOSIS — R52 Pain, unspecified: Secondary | ICD-10-CM | POA: Diagnosis not present

## 2018-06-05 DIAGNOSIS — A4189 Other specified sepsis: Secondary | ICD-10-CM | POA: Diagnosis not present

## 2018-06-05 DIAGNOSIS — N2581 Secondary hyperparathyroidism of renal origin: Secondary | ICD-10-CM | POA: Diagnosis not present

## 2018-06-05 DIAGNOSIS — R509 Fever, unspecified: Secondary | ICD-10-CM | POA: Diagnosis not present

## 2018-06-05 DIAGNOSIS — R52 Pain, unspecified: Secondary | ICD-10-CM | POA: Diagnosis not present

## 2018-06-05 DIAGNOSIS — N186 End stage renal disease: Secondary | ICD-10-CM | POA: Diagnosis not present

## 2018-06-05 DIAGNOSIS — Z992 Dependence on renal dialysis: Secondary | ICD-10-CM | POA: Diagnosis not present

## 2018-06-08 DIAGNOSIS — Z992 Dependence on renal dialysis: Secondary | ICD-10-CM | POA: Diagnosis not present

## 2018-06-08 DIAGNOSIS — N186 End stage renal disease: Secondary | ICD-10-CM | POA: Diagnosis not present

## 2018-06-08 DIAGNOSIS — R52 Pain, unspecified: Secondary | ICD-10-CM | POA: Diagnosis not present

## 2018-06-08 DIAGNOSIS — A4189 Other specified sepsis: Secondary | ICD-10-CM | POA: Diagnosis not present

## 2018-06-08 DIAGNOSIS — R509 Fever, unspecified: Secondary | ICD-10-CM | POA: Diagnosis not present

## 2018-06-08 DIAGNOSIS — N2581 Secondary hyperparathyroidism of renal origin: Secondary | ICD-10-CM | POA: Diagnosis not present

## 2018-06-10 DIAGNOSIS — Z992 Dependence on renal dialysis: Secondary | ICD-10-CM | POA: Diagnosis not present

## 2018-06-10 DIAGNOSIS — N186 End stage renal disease: Secondary | ICD-10-CM | POA: Diagnosis not present

## 2018-06-10 DIAGNOSIS — R52 Pain, unspecified: Secondary | ICD-10-CM | POA: Diagnosis not present

## 2018-06-10 DIAGNOSIS — N2581 Secondary hyperparathyroidism of renal origin: Secondary | ICD-10-CM | POA: Diagnosis not present

## 2018-06-10 DIAGNOSIS — A4189 Other specified sepsis: Secondary | ICD-10-CM | POA: Diagnosis not present

## 2018-06-10 DIAGNOSIS — R509 Fever, unspecified: Secondary | ICD-10-CM | POA: Diagnosis not present

## 2018-06-12 DIAGNOSIS — A4189 Other specified sepsis: Secondary | ICD-10-CM | POA: Diagnosis not present

## 2018-06-12 DIAGNOSIS — R509 Fever, unspecified: Secondary | ICD-10-CM | POA: Diagnosis not present

## 2018-06-12 DIAGNOSIS — N2581 Secondary hyperparathyroidism of renal origin: Secondary | ICD-10-CM | POA: Diagnosis not present

## 2018-06-12 DIAGNOSIS — Z992 Dependence on renal dialysis: Secondary | ICD-10-CM | POA: Diagnosis not present

## 2018-06-12 DIAGNOSIS — N186 End stage renal disease: Secondary | ICD-10-CM | POA: Diagnosis not present

## 2018-06-12 DIAGNOSIS — R52 Pain, unspecified: Secondary | ICD-10-CM | POA: Diagnosis not present

## 2018-06-15 DIAGNOSIS — R509 Fever, unspecified: Secondary | ICD-10-CM | POA: Diagnosis not present

## 2018-06-15 DIAGNOSIS — A4189 Other specified sepsis: Secondary | ICD-10-CM | POA: Diagnosis not present

## 2018-06-15 DIAGNOSIS — N2581 Secondary hyperparathyroidism of renal origin: Secondary | ICD-10-CM | POA: Diagnosis not present

## 2018-06-15 DIAGNOSIS — R52 Pain, unspecified: Secondary | ICD-10-CM | POA: Diagnosis not present

## 2018-06-15 DIAGNOSIS — N186 End stage renal disease: Secondary | ICD-10-CM | POA: Diagnosis not present

## 2018-06-15 DIAGNOSIS — Z992 Dependence on renal dialysis: Secondary | ICD-10-CM | POA: Diagnosis not present

## 2018-06-17 DIAGNOSIS — N186 End stage renal disease: Secondary | ICD-10-CM | POA: Diagnosis not present

## 2018-06-17 DIAGNOSIS — R52 Pain, unspecified: Secondary | ICD-10-CM | POA: Diagnosis not present

## 2018-06-17 DIAGNOSIS — N2581 Secondary hyperparathyroidism of renal origin: Secondary | ICD-10-CM | POA: Diagnosis not present

## 2018-06-17 DIAGNOSIS — R509 Fever, unspecified: Secondary | ICD-10-CM | POA: Diagnosis not present

## 2018-06-17 DIAGNOSIS — A4189 Other specified sepsis: Secondary | ICD-10-CM | POA: Diagnosis not present

## 2018-06-17 DIAGNOSIS — Z992 Dependence on renal dialysis: Secondary | ICD-10-CM | POA: Diagnosis not present

## 2018-06-19 DIAGNOSIS — Z992 Dependence on renal dialysis: Secondary | ICD-10-CM | POA: Diagnosis not present

## 2018-06-19 DIAGNOSIS — N186 End stage renal disease: Secondary | ICD-10-CM | POA: Diagnosis not present

## 2018-06-19 DIAGNOSIS — R52 Pain, unspecified: Secondary | ICD-10-CM | POA: Diagnosis not present

## 2018-06-19 DIAGNOSIS — A4189 Other specified sepsis: Secondary | ICD-10-CM | POA: Diagnosis not present

## 2018-06-19 DIAGNOSIS — N2581 Secondary hyperparathyroidism of renal origin: Secondary | ICD-10-CM | POA: Diagnosis not present

## 2018-06-19 DIAGNOSIS — R509 Fever, unspecified: Secondary | ICD-10-CM | POA: Diagnosis not present

## 2018-06-21 DIAGNOSIS — A4189 Other specified sepsis: Secondary | ICD-10-CM | POA: Diagnosis not present

## 2018-06-21 DIAGNOSIS — R52 Pain, unspecified: Secondary | ICD-10-CM | POA: Diagnosis not present

## 2018-06-21 DIAGNOSIS — N186 End stage renal disease: Secondary | ICD-10-CM | POA: Diagnosis not present

## 2018-06-21 DIAGNOSIS — Z992 Dependence on renal dialysis: Secondary | ICD-10-CM | POA: Diagnosis not present

## 2018-06-21 DIAGNOSIS — N2581 Secondary hyperparathyroidism of renal origin: Secondary | ICD-10-CM | POA: Diagnosis not present

## 2018-06-21 DIAGNOSIS — R509 Fever, unspecified: Secondary | ICD-10-CM | POA: Diagnosis not present

## 2018-06-21 DIAGNOSIS — I4891 Unspecified atrial fibrillation: Secondary | ICD-10-CM | POA: Diagnosis not present

## 2018-06-23 DIAGNOSIS — A4189 Other specified sepsis: Secondary | ICD-10-CM | POA: Diagnosis not present

## 2018-06-23 DIAGNOSIS — R52 Pain, unspecified: Secondary | ICD-10-CM | POA: Diagnosis not present

## 2018-06-23 DIAGNOSIS — R509 Fever, unspecified: Secondary | ICD-10-CM | POA: Diagnosis not present

## 2018-06-23 DIAGNOSIS — N2581 Secondary hyperparathyroidism of renal origin: Secondary | ICD-10-CM | POA: Diagnosis not present

## 2018-06-23 DIAGNOSIS — Z992 Dependence on renal dialysis: Secondary | ICD-10-CM | POA: Diagnosis not present

## 2018-06-23 DIAGNOSIS — N186 End stage renal disease: Secondary | ICD-10-CM | POA: Diagnosis not present

## 2018-06-26 DIAGNOSIS — Z992 Dependence on renal dialysis: Secondary | ICD-10-CM | POA: Diagnosis not present

## 2018-06-26 DIAGNOSIS — A4189 Other specified sepsis: Secondary | ICD-10-CM | POA: Diagnosis not present

## 2018-06-26 DIAGNOSIS — N2581 Secondary hyperparathyroidism of renal origin: Secondary | ICD-10-CM | POA: Diagnosis not present

## 2018-06-26 DIAGNOSIS — N186 End stage renal disease: Secondary | ICD-10-CM | POA: Diagnosis not present

## 2018-06-26 DIAGNOSIS — R509 Fever, unspecified: Secondary | ICD-10-CM | POA: Diagnosis not present

## 2018-06-26 DIAGNOSIS — R52 Pain, unspecified: Secondary | ICD-10-CM | POA: Diagnosis not present

## 2018-06-27 DIAGNOSIS — N186 End stage renal disease: Secondary | ICD-10-CM | POA: Diagnosis not present

## 2018-06-27 DIAGNOSIS — Z992 Dependence on renal dialysis: Secondary | ICD-10-CM | POA: Diagnosis not present

## 2018-06-27 DIAGNOSIS — T8610 Unspecified complication of kidney transplant: Secondary | ICD-10-CM | POA: Diagnosis not present

## 2018-06-29 ENCOUNTER — Other Ambulatory Visit: Payer: Self-pay

## 2018-06-29 ENCOUNTER — Encounter: Payer: Self-pay | Admitting: Vascular Surgery

## 2018-06-29 ENCOUNTER — Ambulatory Visit (INDEPENDENT_AMBULATORY_CARE_PROVIDER_SITE_OTHER): Payer: Medicare Other | Admitting: Physician Assistant

## 2018-06-29 VITALS — BP 97/71 | HR 87 | Temp 97.3°F | Resp 20 | Ht 67.0 in | Wt 223.0 lb

## 2018-06-29 DIAGNOSIS — R52 Pain, unspecified: Secondary | ICD-10-CM | POA: Diagnosis not present

## 2018-06-29 DIAGNOSIS — N186 End stage renal disease: Secondary | ICD-10-CM

## 2018-06-29 DIAGNOSIS — E1029 Type 1 diabetes mellitus with other diabetic kidney complication: Secondary | ICD-10-CM | POA: Diagnosis not present

## 2018-06-29 DIAGNOSIS — R509 Fever, unspecified: Secondary | ICD-10-CM | POA: Diagnosis not present

## 2018-06-29 DIAGNOSIS — N2581 Secondary hyperparathyroidism of renal origin: Secondary | ICD-10-CM | POA: Diagnosis not present

## 2018-06-29 DIAGNOSIS — J14 Pneumonia due to Hemophilus influenzae: Secondary | ICD-10-CM | POA: Diagnosis not present

## 2018-06-29 DIAGNOSIS — I70213 Atherosclerosis of native arteries of extremities with intermittent claudication, bilateral legs: Secondary | ICD-10-CM

## 2018-06-29 DIAGNOSIS — D631 Anemia in chronic kidney disease: Secondary | ICD-10-CM | POA: Diagnosis not present

## 2018-06-29 DIAGNOSIS — D689 Coagulation defect, unspecified: Secondary | ICD-10-CM | POA: Diagnosis not present

## 2018-06-29 DIAGNOSIS — Z992 Dependence on renal dialysis: Secondary | ICD-10-CM | POA: Diagnosis not present

## 2018-06-29 NOTE — Progress Notes (Signed)
Established Dialysis Access   History of Present Illness   Patrick [name] is a 62 y.o. (August 17, 1955) male who presents cc of tenderness over area of nonfunctional L forearm loop graft.  Patient has had several dialysis access surgeries on BUE.  Next option would be to place thigh AVG however it was discussed with patient he would be at risk for limb loss due to diagnosis of PAD.  He is in the process of scheduling PD catheter placement and has been dialyzing via L IJ TDC in the meantime.  About 2-3 weeks ago patient developed local edema and tenderness to area of anastomosis of L forearm loop graft in AC fossa.  He received IV antibiotics during hemodialysis and states blood cultures were negative.  He believes this area is improving with warm compress.  Sometimes discomfort will radiate down into his hand and up to his shoulder.  He denies fevers, chills, N/V.  Current Outpatient Medications  Medication Sig Dispense Refill  . acetaminophen (TYLENOL) 500 MG tablet Take 1,000 mg by mouth every 6 (six) hours as needed for moderate pain or headache.    . albuterol (PROVENTIL HFA;VENTOLIN HFA) 108 (90 Base) MCG/ACT inhaler Inhale 1-2 puffs into the lungs every 6 (six) hours as needed for wheezing or shortness of breath. 1 Inhaler 0  . aspirin EC 81 MG tablet Take 81 mg by mouth daily.    . calcium acetate (PHOSLO) 667 MG capsule Take 1,334 mg by mouth daily at 12 noon.     . Ceftazidime (FORTAZ) 2 g SOLR injection To be given with dialysis for 2 weeks 30 each 0  . clopidogrel (PLAVIX) 75 MG tablet Take 1 tablet (75 mg total) by mouth daily. 90 tablet 3  . ezetimibe (ZETIA) 10 MG tablet Take 1 tablet (10 mg total) by mouth daily. 90 tablet 3  . guaiFENesin (MUCINEX) 600 MG 12 hr tablet Take 1 tablet (600 mg total) by mouth 2 (two) times daily. 30 tablet 0  . guaiFENesin (ROBITUSSIN) 100 MG/5ML SOLN Take 5 mLs (100 mg total) by mouth every 4 (four) hours as needed for cough or to loosen phlegm. 236 mL  0  . isosorbide mononitrate (IMDUR) 60 MG 24 hr tablet Take 1.5 tablets (90 mg total) by mouth daily. 135 tablet 3  . multivitamin (RENA-VIT) TABS tablet Take 1 tablet by mouth at bedtime. 30 tablet 0  . predniSONE (DELTASONE) 50 MG tablet Take 12 hours prior to cath, 6 hours prior and 1 hour prior 3 tablet 0  . repaglinide (PRANDIN) 0.5 MG tablet Take 1 tablet (0.5 mg total) by mouth 2 (two) times daily before a meal. 180 tablet 3  . traMADol (ULTRAM) 50 MG tablet Take 1 tablet (50 mg total) by mouth every 6 (six) hours as needed for moderate pain. 8 tablet 0  . amLODipine (NORVASC) 5 MG tablet Take 1 tablet (5 mg total) by mouth daily. 90 tablet 3   No current facility-administered medications for this visit.     Physical Examination   Vitals:   06/29/18 1545  BP: 97/71  Pulse: 87  Resp: 20  Temp: (!) 97.3 F (36.3 C)  TempSrc: Oral  SpO2: 92%  Weight: 223 lb (101.2 kg)  Height: 5\' 7"  (1.702 m)   Body mass index is 34.93 kg/m.  General Alert, O x 3, WD, NAD  Pulmonary Sym exp, good B air movt  Cardiac RRR, Nl S1, S2  Vascular Vessel Right Left  Radial Palpable  Palpable  Ulnar Not palpable Not palpable    Musculo- skeletal M/S 5/5 throughout  , Extremities without ischemic changes  L arm loop graft without palpable fluid collections, tenderness, or fluctuance, no impressive edema; no erythema; no sign of infection  Neurologic A&O; CN grossly intact     Medical Decision Making   Patrick [name] is a 61 y.o. male who presents with cc of tenderness in area of nonfunctional L forearm loop graft   Dr. Donnetta Hutching was involved in the evaluation and management plan of this patient during office visit today  Area of tenderness not concerning for infection; no indication for graft removal at this time  Ok to discontinue antibiotics during HD  Patient encouraged to seek immediate medical attention if he develops chest pain or believes shoulder/arm pain is related to his  heart  He may follow up on an as needed basis   Dagoberto Ligas PA-C [facility] Office: [phone]

## 2018-07-01 DIAGNOSIS — R509 Fever, unspecified: Secondary | ICD-10-CM | POA: Diagnosis not present

## 2018-07-01 DIAGNOSIS — R52 Pain, unspecified: Secondary | ICD-10-CM | POA: Diagnosis not present

## 2018-07-01 DIAGNOSIS — D689 Coagulation defect, unspecified: Secondary | ICD-10-CM | POA: Diagnosis not present

## 2018-07-01 DIAGNOSIS — E1029 Type 1 diabetes mellitus with other diabetic kidney complication: Secondary | ICD-10-CM | POA: Diagnosis not present

## 2018-07-01 DIAGNOSIS — Z992 Dependence on renal dialysis: Secondary | ICD-10-CM | POA: Diagnosis not present

## 2018-07-01 DIAGNOSIS — N186 End stage renal disease: Secondary | ICD-10-CM | POA: Diagnosis not present

## 2018-07-02 ENCOUNTER — Other Ambulatory Visit: Payer: Medicare Other | Admitting: *Deleted

## 2018-07-02 DIAGNOSIS — E785 Hyperlipidemia, unspecified: Secondary | ICD-10-CM | POA: Diagnosis not present

## 2018-07-03 DIAGNOSIS — R52 Pain, unspecified: Secondary | ICD-10-CM | POA: Diagnosis not present

## 2018-07-03 DIAGNOSIS — D689 Coagulation defect, unspecified: Secondary | ICD-10-CM | POA: Diagnosis not present

## 2018-07-03 DIAGNOSIS — N186 End stage renal disease: Secondary | ICD-10-CM | POA: Diagnosis not present

## 2018-07-03 DIAGNOSIS — R509 Fever, unspecified: Secondary | ICD-10-CM | POA: Diagnosis not present

## 2018-07-03 DIAGNOSIS — E1029 Type 1 diabetes mellitus with other diabetic kidney complication: Secondary | ICD-10-CM | POA: Diagnosis not present

## 2018-07-03 DIAGNOSIS — Z992 Dependence on renal dialysis: Secondary | ICD-10-CM | POA: Diagnosis not present

## 2018-07-03 LAB — HEPATIC FUNCTION PANEL
ALK PHOS: 173 IU/L — AB (ref 39–117)
ALT: 8 IU/L (ref 0–44)
AST: 14 IU/L (ref 0–40)
Albumin: 4.5 g/dL (ref 3.6–4.8)
BILIRUBIN TOTAL: 0.3 mg/dL (ref 0.0–1.2)
Bilirubin, Direct: 0.12 mg/dL (ref 0.00–0.40)
Total Protein: 7.3 g/dL (ref 6.0–8.5)

## 2018-07-03 LAB — LIPID PANEL
CHOLESTEROL TOTAL: 176 mg/dL (ref 100–199)
Chol/HDL Ratio: 4.5 ratio (ref 0.0–5.0)
HDL: 39 mg/dL — ABNORMAL LOW (ref >39)
LDL Calculated: 89 mg/dL (ref 0–99)
Triglycerides: 241 mg/dL — ABNORMAL HIGH (ref 0–149)
VLDL Cholesterol Cal: 48 mg/dL — ABNORMAL HIGH (ref 5–40)

## 2018-07-05 ENCOUNTER — Telehealth: Payer: Self-pay

## 2018-07-05 ENCOUNTER — Other Ambulatory Visit: Payer: Self-pay

## 2018-07-05 NOTE — Telephone Encounter (Signed)
Reviewed results with pt who verbalized understanding. Pt states that he does take Crestor 10 mg almost everyday when he remembers to. I let pt know that I will call him back if Richardson Dopp, PA-C has anymore recommendations. Pt thanked me for the call.

## 2018-07-05 NOTE — Telephone Encounter (Signed)
Reviewed results with pt who verbalized understanding. Pt states that he does take Crestor 10 mg almost everyday when he remembers to. I let pt know that I will call him back if Richardson Dopp, PA-C has anymore recommendations. Med list has bee updated. Pt thanked me for the call.

## 2018-07-05 NOTE — Telephone Encounter (Signed)
Continue current medications. follow up with PCP for elevated alk phos - see result note for Lipid/LFTs results. Richardson Dopp, PA-C    07/05/2018 5:12 PM

## 2018-07-06 DIAGNOSIS — R509 Fever, unspecified: Secondary | ICD-10-CM | POA: Diagnosis not present

## 2018-07-06 DIAGNOSIS — R52 Pain, unspecified: Secondary | ICD-10-CM | POA: Diagnosis not present

## 2018-07-06 DIAGNOSIS — N186 End stage renal disease: Secondary | ICD-10-CM | POA: Diagnosis not present

## 2018-07-06 DIAGNOSIS — Z992 Dependence on renal dialysis: Secondary | ICD-10-CM | POA: Diagnosis not present

## 2018-07-06 DIAGNOSIS — D689 Coagulation defect, unspecified: Secondary | ICD-10-CM | POA: Diagnosis not present

## 2018-07-06 DIAGNOSIS — E1029 Type 1 diabetes mellitus with other diabetic kidney complication: Secondary | ICD-10-CM | POA: Diagnosis not present

## 2018-07-06 NOTE — Telephone Encounter (Signed)
Reviewed results with patient who verbalized understanding. 

## 2018-07-08 DIAGNOSIS — N186 End stage renal disease: Secondary | ICD-10-CM | POA: Diagnosis not present

## 2018-07-08 DIAGNOSIS — R509 Fever, unspecified: Secondary | ICD-10-CM | POA: Diagnosis not present

## 2018-07-08 DIAGNOSIS — Z992 Dependence on renal dialysis: Secondary | ICD-10-CM | POA: Diagnosis not present

## 2018-07-08 DIAGNOSIS — R52 Pain, unspecified: Secondary | ICD-10-CM | POA: Diagnosis not present

## 2018-07-08 DIAGNOSIS — E1029 Type 1 diabetes mellitus with other diabetic kidney complication: Secondary | ICD-10-CM | POA: Diagnosis not present

## 2018-07-08 DIAGNOSIS — D689 Coagulation defect, unspecified: Secondary | ICD-10-CM | POA: Diagnosis not present

## 2018-07-10 DIAGNOSIS — R52 Pain, unspecified: Secondary | ICD-10-CM | POA: Diagnosis not present

## 2018-07-10 DIAGNOSIS — D689 Coagulation defect, unspecified: Secondary | ICD-10-CM | POA: Diagnosis not present

## 2018-07-10 DIAGNOSIS — E1029 Type 1 diabetes mellitus with other diabetic kidney complication: Secondary | ICD-10-CM | POA: Diagnosis not present

## 2018-07-10 DIAGNOSIS — N186 End stage renal disease: Secondary | ICD-10-CM | POA: Diagnosis not present

## 2018-07-10 DIAGNOSIS — Z992 Dependence on renal dialysis: Secondary | ICD-10-CM | POA: Diagnosis not present

## 2018-07-10 DIAGNOSIS — R509 Fever, unspecified: Secondary | ICD-10-CM | POA: Diagnosis not present

## 2018-07-13 DIAGNOSIS — E1029 Type 1 diabetes mellitus with other diabetic kidney complication: Secondary | ICD-10-CM | POA: Diagnosis not present

## 2018-07-13 DIAGNOSIS — R52 Pain, unspecified: Secondary | ICD-10-CM | POA: Diagnosis not present

## 2018-07-13 DIAGNOSIS — N186 End stage renal disease: Secondary | ICD-10-CM | POA: Diagnosis not present

## 2018-07-13 DIAGNOSIS — Z992 Dependence on renal dialysis: Secondary | ICD-10-CM | POA: Diagnosis not present

## 2018-07-13 DIAGNOSIS — D689 Coagulation defect, unspecified: Secondary | ICD-10-CM | POA: Diagnosis not present

## 2018-07-13 DIAGNOSIS — R509 Fever, unspecified: Secondary | ICD-10-CM | POA: Diagnosis not present

## 2018-07-15 DIAGNOSIS — R52 Pain, unspecified: Secondary | ICD-10-CM | POA: Diagnosis not present

## 2018-07-15 DIAGNOSIS — N186 End stage renal disease: Secondary | ICD-10-CM | POA: Diagnosis not present

## 2018-07-15 DIAGNOSIS — R509 Fever, unspecified: Secondary | ICD-10-CM | POA: Diagnosis not present

## 2018-07-15 DIAGNOSIS — D689 Coagulation defect, unspecified: Secondary | ICD-10-CM | POA: Diagnosis not present

## 2018-07-15 DIAGNOSIS — E1029 Type 1 diabetes mellitus with other diabetic kidney complication: Secondary | ICD-10-CM | POA: Diagnosis not present

## 2018-07-15 DIAGNOSIS — Z992 Dependence on renal dialysis: Secondary | ICD-10-CM | POA: Diagnosis not present

## 2018-07-17 DIAGNOSIS — E1029 Type 1 diabetes mellitus with other diabetic kidney complication: Secondary | ICD-10-CM | POA: Diagnosis not present

## 2018-07-17 DIAGNOSIS — N186 End stage renal disease: Secondary | ICD-10-CM | POA: Diagnosis not present

## 2018-07-17 DIAGNOSIS — R52 Pain, unspecified: Secondary | ICD-10-CM | POA: Diagnosis not present

## 2018-07-17 DIAGNOSIS — Z992 Dependence on renal dialysis: Secondary | ICD-10-CM | POA: Diagnosis not present

## 2018-07-17 DIAGNOSIS — R509 Fever, unspecified: Secondary | ICD-10-CM | POA: Diagnosis not present

## 2018-07-17 DIAGNOSIS — D689 Coagulation defect, unspecified: Secondary | ICD-10-CM | POA: Diagnosis not present

## 2018-07-19 DIAGNOSIS — R52 Pain, unspecified: Secondary | ICD-10-CM | POA: Diagnosis not present

## 2018-07-19 DIAGNOSIS — Z992 Dependence on renal dialysis: Secondary | ICD-10-CM | POA: Diagnosis not present

## 2018-07-19 DIAGNOSIS — R509 Fever, unspecified: Secondary | ICD-10-CM | POA: Diagnosis not present

## 2018-07-19 DIAGNOSIS — I4891 Unspecified atrial fibrillation: Secondary | ICD-10-CM | POA: Diagnosis not present

## 2018-07-19 DIAGNOSIS — E1029 Type 1 diabetes mellitus with other diabetic kidney complication: Secondary | ICD-10-CM | POA: Diagnosis not present

## 2018-07-19 DIAGNOSIS — D689 Coagulation defect, unspecified: Secondary | ICD-10-CM | POA: Diagnosis not present

## 2018-07-19 DIAGNOSIS — N186 End stage renal disease: Secondary | ICD-10-CM | POA: Diagnosis not present

## 2018-07-22 DIAGNOSIS — R52 Pain, unspecified: Secondary | ICD-10-CM | POA: Diagnosis not present

## 2018-07-22 DIAGNOSIS — R509 Fever, unspecified: Secondary | ICD-10-CM | POA: Diagnosis not present

## 2018-07-22 DIAGNOSIS — E1029 Type 1 diabetes mellitus with other diabetic kidney complication: Secondary | ICD-10-CM | POA: Diagnosis not present

## 2018-07-22 DIAGNOSIS — Z992 Dependence on renal dialysis: Secondary | ICD-10-CM | POA: Diagnosis not present

## 2018-07-22 DIAGNOSIS — N186 End stage renal disease: Secondary | ICD-10-CM | POA: Diagnosis not present

## 2018-07-22 DIAGNOSIS — D689 Coagulation defect, unspecified: Secondary | ICD-10-CM | POA: Diagnosis not present

## 2018-07-24 ENCOUNTER — Encounter (HOSPITAL_COMMUNITY): Payer: Self-pay | Admitting: Emergency Medicine

## 2018-07-24 ENCOUNTER — Emergency Department (HOSPITAL_COMMUNITY)
Admission: EM | Admit: 2018-07-24 | Discharge: 2018-07-24 | Disposition: A | Discharge status: Home / Self Care | Payer: Medicare Other | Attending: Emergency Medicine | Admitting: Emergency Medicine

## 2018-07-24 ENCOUNTER — Other Ambulatory Visit: Payer: Self-pay

## 2018-07-24 ENCOUNTER — Emergency Department (HOSPITAL_COMMUNITY): Payer: Medicare Other

## 2018-07-24 ENCOUNTER — Emergency Department (HOSPITAL_BASED_OUTPATIENT_CLINIC_OR_DEPARTMENT_OTHER): Payer: Medicare Other

## 2018-07-24 DIAGNOSIS — M79609 Pain in unspecified limb: Secondary | ICD-10-CM | POA: Diagnosis not present

## 2018-07-24 DIAGNOSIS — Z7982 Long term (current) use of aspirin: Secondary | ICD-10-CM | POA: Insufficient documentation

## 2018-07-24 DIAGNOSIS — M79605 Pain in left leg: Secondary | ICD-10-CM | POA: Diagnosis not present

## 2018-07-24 DIAGNOSIS — I132 Hypertensive heart and chronic kidney disease with heart failure and with stage 5 chronic kidney disease, or end stage renal disease: Secondary | ICD-10-CM | POA: Diagnosis not present

## 2018-07-24 DIAGNOSIS — I5041 Acute combined systolic (congestive) and diastolic (congestive) heart failure: Secondary | ICD-10-CM | POA: Diagnosis not present

## 2018-07-24 DIAGNOSIS — R509 Fever, unspecified: Secondary | ICD-10-CM | POA: Diagnosis not present

## 2018-07-24 DIAGNOSIS — D689 Coagulation defect, unspecified: Secondary | ICD-10-CM | POA: Diagnosis not present

## 2018-07-24 DIAGNOSIS — N186 End stage renal disease: Secondary | ICD-10-CM | POA: Diagnosis not present

## 2018-07-24 DIAGNOSIS — I2510 Atherosclerotic heart disease of native coronary artery without angina pectoris: Secondary | ICD-10-CM | POA: Insufficient documentation

## 2018-07-24 DIAGNOSIS — Z79899 Other long term (current) drug therapy: Secondary | ICD-10-CM | POA: Diagnosis not present

## 2018-07-24 DIAGNOSIS — Z87891 Personal history of nicotine dependence: Secondary | ICD-10-CM | POA: Diagnosis not present

## 2018-07-24 DIAGNOSIS — E1029 Type 1 diabetes mellitus with other diabetic kidney complication: Secondary | ICD-10-CM | POA: Diagnosis not present

## 2018-07-24 DIAGNOSIS — Z951 Presence of aortocoronary bypass graft: Secondary | ICD-10-CM | POA: Diagnosis not present

## 2018-07-24 DIAGNOSIS — Z7902 Long term (current) use of antithrombotics/antiplatelets: Secondary | ICD-10-CM | POA: Diagnosis not present

## 2018-07-24 DIAGNOSIS — Z992 Dependence on renal dialysis: Secondary | ICD-10-CM | POA: Diagnosis not present

## 2018-07-24 DIAGNOSIS — M79662 Pain in left lower leg: Secondary | ICD-10-CM | POA: Diagnosis not present

## 2018-07-24 DIAGNOSIS — I252 Old myocardial infarction: Secondary | ICD-10-CM | POA: Insufficient documentation

## 2018-07-24 DIAGNOSIS — E1122 Type 2 diabetes mellitus with diabetic chronic kidney disease: Secondary | ICD-10-CM | POA: Insufficient documentation

## 2018-07-24 DIAGNOSIS — R52 Pain, unspecified: Secondary | ICD-10-CM | POA: Diagnosis not present

## 2018-07-24 MED ORDER — OXYCODONE-ACETAMINOPHEN 5-325 MG PO TABS
2.0000 | ORAL_TABLET | Freq: Once | ORAL | Status: DC
  Filled 2018-07-24: qty 2

## 2018-07-24 NOTE — ED Notes (Signed)
Patient verbalizes understanding of discharge instructions. Opportunity for questioning and answers were provided. Armband removed by staff, pt discharged from ED.  

## 2018-07-24 NOTE — ED Notes (Signed)
Patient transported to Ultrasound 

## 2018-07-24 NOTE — ED Triage Notes (Signed)
Pt has been having pain in left hamstring and groin for 1 week. Denies fever or injury.

## 2018-07-24 NOTE — Progress Notes (Signed)
VASCULAR LAB PRELIMINARY  PRELIMINARY  PRELIMINARY  PRELIMINARY  Left lower extremity venous duplex completed.    Preliminary report:  There is no obvious evidence of DVT or SVT noted in the left lower extremity.   Called results to Dr. Alfonso Ellis, [facility], RVT 07/24/2018, 3:32 PM

## 2018-07-24 NOTE — ED Provider Notes (Signed)
[facility] EMERGENCY DEPARTMENT Provider Note   CSN: [id number] Arrival date & time: 07/24/18  1048     History   Chief Complaint Chief Complaint  Patient presents with  . Leg Pain    HPI Patrick [name] is a 62 y.o. male.  The history is provided by the patient.  Leg Pain   This is a new problem. The current episode started 2 days ago. The problem occurs constantly. The problem has not changed since onset.The pain is present in the left upper leg. The quality of the pain is described as aching. The pain is moderate. Associated symptoms include stiffness. He has tried nothing for the symptoms. There has been no history of extremity trauma.    Past Medical History:  Diagnosis Date  . Arthritis    "back" (09/24/2016)  . CHF (congestive heart failure) ([place])   . Coronary artery disease   . Dysrhythmia    aflutter s/p ablation 2018  . ESRD (end stage renal disease) on dialysis [facility])    "TTS; [place]" (09/24/2016)  . Gout   . Hernia of abdominal wall   . History of blood transfusion 2009- 2016   "I've had several; low HgB"  . History of kidney stones    treated with nephrectomy  . Hypertension   . Impotence of organic origin   . Migraine    "stopped in my 30's; they were related to high BP" (09/24/2016)  . Myocardial infarction [facility]) '96  . Pneumonia 01/2015   " a touch & I was in the hosp."  . Secondary hyperparathyroidism ([place])   . Type 2 diabetes mellitus (HCC)    no longer on medication since going on dialysis  . Wears glasses     Patient Active Problem List   Diagnosis Date Noted  . Chronic anemia 05/12/2018  . Hypokalemia 05/12/2018  . Acute combined systolic and diastolic congestive heart failure ([place]) 05/12/2018  . Hyperlipidemia 05/12/2018  . Physical deconditioning 05/12/2018  . Multifocal pneumonia 05/11/2018  . Allergy to IVP dye, subsequent encounter   . CAD (coronary artery disease) 11/27/2017  . Atherosclerosis of native  arteries of extremity with intermittent claudication ([place]) 04/03/2017  . PAD (peripheral artery disease) ([place]) 02/02/2017  . Typical atrial flutter ([place]) 09/24/2016  . PAF (paroxysmal atrial fibrillation) ([place]) 05/25/2016  . ESRD (end stage renal disease) ([place]) 05/11/2016  . Anemia of chronic disease   . End-stage renal disease on hemodialysis ([place])   . Renovascular hypertension   . History of MI (myocardial infarction)   . Acute respiratory failure with hypoxia ([place])   . Controlled diabetes mellitus type 2 with complications ([place])   . Pleural effusion   . Symptomatic anemia 01/22/2016  . Acute respiratory failure ([place]) 01/22/2016  . Chronic diastolic CHF (congestive heart failure) ([place]) 01/22/2016  . Elevated troponin 01/22/2016  . Febrile illness 01/22/2016  . Normocytic anemia 11/17/2015  . Gynecomastia 11/17/2015  . Renal stone 11/17/2015  . Cough 11/17/2015  . CAP (community acquired pneumonia)   . HCAP (healthcare-associated pneumonia) 10/11/2015  . Acute respiratory disease 10/11/2015  . Protein-calorie malnutrition, severe ([place]) 09/16/2014  . 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 Metabolic acidosis, NAG, failure of bicarbonate regeneration 09/14/2014  . Anemia in chronic kidney disease 11/24/2013  . 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 Hypocalcemia 11/24/2013  . Low bicarbonate 11/24/2013  . Diabetes mellitus type 2, controlled ([place]) 10/20/2013  . Essential hypertension 03/19/2013  . Acute gout 03/19/2013  . Impotence of  organic origin 03/19/2013  . Secondary hyperparathyroidism ([place]) 03/19/2013  . End-stage renal failure with renal transplant ([place]) 03/19/2013  . 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 A-FLUTTER ABLATION N/A 09/24/2016   Procedure: A-Flutter Ablation;  Surgeon: Will Meredith Leeds, MD;  Location: [facility];  Service: Cardiovascular;   Laterality: N/A;  . ABDOMINAL AORTOGRAM W/LOWER EXTREMITY N/A 04/13/2017   Procedure: ABDOMINAL AORTOGRAM W/LOWER EXTREMITY;  Surgeon: Conrad [place], MD;  Location: [facility];  Service: Cardiovascular;  Laterality: N/A;  [place] lower extermity  . APPENDECTOMY    . AV FISTULA PLACEMENT Right 09/18/2014   Procedure: INSERTION OF ARTERIOVENOUS (AV) GORE-TEX GRAFT ARM USING 4-7MM  X 45CM STRETCH GORE-TEX VASCULAR GRAFT;  Surgeon: Rosetta Posner, MD;  Location: [facility];  Service: Vascular;  Laterality: Right;  . AV FISTULA PLACEMENT Left 07/07/2016   Procedure: INSERTION OF LEFT BRACHIAL TO AXILLARY ARTERIOVENOUS (AV) GORE-TEX ARM GRAFT;  Surgeon: Conrad [place], MD;  Location: [facility];  Service: Vascular;  Laterality: Left;  . CARDIAC CATHETERIZATION  ~ 2016  . COLONOSCOPY    . 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 EXCHANGE OF A DIALYSIS CATHETER Left 01/11/2018   Procedure: EXCHANGE OF TUNNELED DIALYSIS CATHETER;  Surgeon: Rosetta Posner, MD;  Location: [facility];  Service: Vascular;  Laterality: Left;  . HERNIA REPAIR  2017   with nephrectomy  . INSERTION OF DIALYSIS CATHETER N/A 10/08/2017   Procedure: INSERTION OF TUNNELED DIALYSIS CATHETER;  Surgeon: Conrad [place], MD;  Location: [facility];  Service: Vascular;  Laterality: N/A;  . IR FLUORO GUIDE CV LINE LEFT  03/12/2018  . IR GENERIC HISTORICAL  05/11/2016   IR FLUORO GUIDE CV LINE LEFT 05/11/2016 Corrie Mckusick, DO [facility]  . IR GENERIC HISTORICAL  05/11/2016   IR US GUIDE VASC ACCESS LEFT 05/11/2016 Corrie Mckusick, DO [facility]  . IR GENERIC HISTORICAL  05/11/2016   IR US GUIDE VASC ACCESS RIGHT 05/11/2016 Corrie Mckusick, DO [facility]  . IR GENERIC HISTORICAL  05/11/2016   IR RADIOLOGY PERIPHERAL GUIDED IV START 05/11/2016 Corrie Mckusick, DO [facility]  . KIDNEY TRANSPLANT  2009  . LEFT HEART CATH AND CORS/GRAFTS ANGIOGRAPHY N/A 12/04/2017   Procedure: LEFT  HEART CATH AND CORS/GRAFTS ANGIOGRAPHY;  Surgeon: Belva Crome, MD;  Location: [facility];  Service: Cardiovascular;  Laterality: N/A;  . NEPHRECTOMY  2017   transplant rejected   . PERIPHERAL VASCULAR CATHETERIZATION N/A 06/04/2016   Procedure: Upper Extremity Venography;  Surgeon: Waynetta Sandy, MD;  Location: [facility];  Service: Cardiovascular;  Laterality: N/A;  . PERIPHERAL VASCULAR INTERVENTION  04/13/2017   Procedure: PERIPHERAL VASCULAR INTERVENTION;  Surgeon: Conrad [place], MD;  Location: [facility];  Service: Cardiovascular;;  Lt. Common/Exernal  Iliac  . THROMBECTOMY AND REVISION OF ARTERIOVENTOUS (AV) GORETEX  GRAFT Left 10/08/2017   Procedure: THROMBECTOMY of ARTERIOVENTOUS (AV) GORETEX  GRAFT LEFT UPPER ARM;  Surgeon: Conrad [place], MD;  Location: [facility];  Service: Vascular;  Laterality: Left;  . THROMBECTOMY W/ EMBOLECTOMY Left 09/14/2017   Procedure: THROMBECTOMY ARTERIOVENOUS GORE-TEX GRAFT LEFT UPPER ARM;  Surgeon: Rosetta Posner, MD;  Location: [facility];  Service: Vascular;  Laterality: Left;  . UPPER EXTREMITY VENOGRAPHY N/A 11/16/2017   Procedure:  UPPER EXTREMITY VENOGRAPHY - Right Arm;  Surgeon: Conrad Bixby, MD;  Location: [facility];  Service: Cardiovascular;  Laterality: N/A;        Home Medications    Prior to Admission medications   Medication Sig Start Date End Date Taking? Authorizing Provider  acetaminophen (TYLENOL) 500 MG tablet Take 1,000 mg by mouth every 6 (six) hours as needed for moderate pain or headache.    [provider]  albuterol (PROVENTIL HFA;VENTOLIN HFA) 108 (90 Base) MCG/ACT inhaler Inhale 1-2 puffs into the lungs every 6 (six) hours as needed for wheezing or shortness of breath. 12/18/17   Richardson Dopp T, PA-C  amLODipine (NORVASC) 5 MG tablet Take 1 tablet (5 mg total) by mouth daily. 11/27/17 05/11/18  Richardson Dopp T, PA-C  aspirin EC 81 MG tablet Take 81 mg by mouth daily.    [provider]  calcium acetate (PHOSLO) 667 MG capsule Take 1,334 mg by mouth daily at 12 noon.     [provider]  Ceftazidime (FORTAZ) 2 g SOLR injection To be given with dialysis for 2 weeks 05/18/18   Bonnielee Haff, MD  clopidogrel (PLAVIX) 75 MG tablet Take 1 tablet (75 mg total) by mouth daily. 12/18/17   Richardson Dopp T, PA-C  ezetimibe (ZETIA) 10 MG tablet Take 1 tablet (10 mg total) by mouth daily. 04/02/18 07/01/18  Richardson Dopp T, PA-C  guaiFENesin (MUCINEX) 600 MG 12 hr tablet Take 1 tablet (600 mg total) by mouth 2 (two) times daily. 05/18/18   Bonnielee Haff, MD  guaiFENesin (ROBITUSSIN) 100 MG/5ML SOLN Take 5 mLs (100 mg total) by mouth every 4 (four) hours as needed for cough or to loosen phlegm. 05/18/18   Bonnielee Haff, MD  isosorbide mononitrate (IMDUR) 60 MG 24 hr tablet Take 1.5 tablets (90 mg total) by mouth daily. 04/06/18 07/05/18  Richardson Dopp T, PA-C  multivitamin (RENA-VIT) TABS tablet Take 1 tablet by mouth at bedtime. 05/18/18   Bonnielee Haff, MD  predniSONE (DELTASONE) 50 MG tablet Take 12 hours prior to cath, 6 hours prior and 1 hour prior 11/27/17   Richardson Dopp T, PA-C  repaglinide (PRANDIN) 0.5 MG tablet Take 1 tablet (0.5 mg total) by mouth 2 (two) times daily before a meal. 04/05/18   Renato Shin, MD  rosuvastatin (CRESTOR) 10 MG tablet Take 10 mg by mouth daily. 06/16/18   [provider]  traMADol (ULTRAM) 50 MG tablet Take 1 tablet (50 mg total) by mouth every 6 (six) hours as needed for moderate pain. 10/08/17   Gabriel Earing, PA-C    Family History Family History  Problem Relation Age of Onset  . Hyperlipidemia Mother   . Hypertension Mother   . HIV Sister     Social History Social History   Tobacco Use  . Smoking status: Former Smoker    Types: Cigarettes    Last attempt to quit: 07/04/2014    Years since quitting: 4.0  . Smokeless tobacco: Never Used  . Tobacco comment: "smoked ~ 1 pack/month when I did smoke; never a  steady smoker"  Substance Use Topics  . Alcohol use: Yes    Alcohol/week: 0.0 standard drinks    Comment: rare  "2 drinks, 1-2 times/year"  . Drug use: No     Allergies   Iodinated diagnostic agents; Lipitor [atorvastatin]; and Metoprolol   Review of Systems Review of Systems  Musculoskeletal: Positive for stiffness.  All other systems reviewed and are negative.  Physical Exam Updated Vital Signs BP 133/77 (BP Location: Right Arm)   Pulse 77   Temp 98 F (36.7 C) (Oral)   Resp 16   Ht 5\' 6"  (1.676 m)   Wt 103.4 kg   SpO2 100%   BMI 36.80 kg/m   Physical Exam Vitals signs and nursing note reviewed.  Constitutional:      Appearance: He is well-developed.  HENT:     Head: Normocephalic and atraumatic.  Eyes:     Extraocular Movements: Extraocular movements intact.     Conjunctiva/sclera: Conjunctivae normal.     Pupils: Pupils are equal, round, and reactive to light.  Neck:     Musculoskeletal: Normal range of motion.  Cardiovascular:     Rate and Rhythm: Normal rate.  Pulmonary:     Effort: Pulmonary effort is normal. No respiratory distress.  Abdominal:     General: There is no distension.  Musculoskeletal: Normal range of motion.        General: Tenderness (over adductors of left thigh, not necessarily worse with ROM) present. No swelling, deformity or signs of injury.     Right lower leg: No edema.     Left lower leg: No edema.  Skin:    General: Skin is warm and dry.  Neurological:     General: No focal deficit present.     Mental Status: He is alert.      ED Treatments / Results  Labs (all labs ordered are listed, but only abnormal results are displayed) Labs Reviewed - No data to display  EKG None  Radiology Dg Pelvis 1-2 Views  Result Date: 07/24/2018 CLINICAL DATA:  Left thigh pain. EXAM: PELVIS - 1-2 VIEW COMPARISON:  Scout image for CT scan abdomen and pelvis dated 04/26/2018 FINDINGS: There is no fracture or bone destruction or  other significant bone abnormality. Extensive arterial vascular calcifications. IMPRESSION: No significant abnormality. Electronically Signed   By: Lorriane Shire M.D.   On: 07/24/2018 13:56   Dg Femur Min 2 Views Left  Result Date: 07/24/2018 CLINICAL DATA:  Acute left thigh pain. EXAM: LEFT FEMUR 2 VIEWS COMPARISON:  None. FINDINGS: There is no fracture or dislocation. No appreciable arthritis of left hip or left knee. Extensive arterial vascular calcifications in the thigh. IMPRESSION: Normal left femur. Electronically Signed   By: Lorriane Shire M.D.   On: 07/24/2018 13:40   Vas [place] Lower Extremity Venous (dvt) (only [facility])  Result Date: 07/24/2018  Lower Venous Study Indications: Groin and proximal thigh pain.  Limitations: Patient tensing and holding breath while scanning thigh and groin area. Comparison Study: No prior study on file Performing Technologist: Sharion Dove RVS  Examination Guidelines: A complete evaluation includes B-mode imaging, spectral Doppler, color Doppler, and power Doppler as needed of all accessible portions of each vessel. Bilateral testing is considered an integral part of a complete examination. Limited examinations for reoccurring indications may be performed as noted.  Right Venous Findings: +---+---------------+---------+-----------+----------+-------+    CompressibilityPhasicitySpontaneityPropertiesSummary +---+---------------+---------+-----------+----------+-------+ CFVFull           Yes      Yes                          +---+---------------+---------+-----------+----------+-------+  Left Venous Findings: +---------+---------------+---------+-----------+----------+-------+          CompressibilityPhasicitySpontaneityPropertiesSummary +---------+---------------+---------+-----------+----------+-------+ CFV      Full           Yes      Yes                          +---------+---------------+---------+-----------+----------+-------+  SFJ       Full                                                 +---------+---------------+---------+-----------+----------+-------+ FV Prox  Full                                                 +---------+---------------+---------+-----------+----------+-------+ FV Mid   Full                                                 +---------+---------------+---------+-----------+----------+-------+ FV DistalFull                                                 +---------+---------------+---------+-----------+----------+-------+ PFV      Full                                                 +---------+---------------+---------+-----------+----------+-------+ POP      Full           Yes      Yes                          +---------+---------------+---------+-----------+----------+-------+ PTV      Full                                                 +---------+---------------+---------+-----------+----------+-------+ PERO     Full                                                 +---------+---------------+---------+-----------+----------+-------+    Summary: Right: No evidence of common femoral vein obstruction. Left: There is no evidence of deep vein thrombosis in the lower extremity.  *See table(s) above for measurements and observations.    Preliminary     Procedures Procedures (including critical care time)  Medications Ordered in ED Medications - No data to display   Initial Impression / Assessment and Plan / ED Course  I have reviewed the triage vital signs and the nursing notes.  Pertinent labs & imaging results that were available during my care of the patient were reviewed by me and considered in my medical decision making (see chart for details).    Atraumatic leg pain. Will eval for dvt osseous lesions.   Workup unremarkable, able to ambulate, will continue with outpatient workkup.   Final Clinical Impressions(s) / ED Diagnoses   Final diagnoses:    Left leg pain    ED Discharge Orders    None  Manasi Dishon, Corene Cornea, MD 07/25/18 [phone]

## 2018-07-26 DIAGNOSIS — N186 End stage renal disease: Secondary | ICD-10-CM | POA: Diagnosis not present

## 2018-07-26 DIAGNOSIS — R52 Pain, unspecified: Secondary | ICD-10-CM | POA: Diagnosis not present

## 2018-07-26 DIAGNOSIS — Z992 Dependence on renal dialysis: Secondary | ICD-10-CM | POA: Diagnosis not present

## 2018-07-26 DIAGNOSIS — D689 Coagulation defect, unspecified: Secondary | ICD-10-CM | POA: Diagnosis not present

## 2018-07-26 DIAGNOSIS — R509 Fever, unspecified: Secondary | ICD-10-CM | POA: Diagnosis not present

## 2018-07-26 DIAGNOSIS — E1029 Type 1 diabetes mellitus with other diabetic kidney complication: Secondary | ICD-10-CM | POA: Diagnosis not present

## 2018-07-28 DIAGNOSIS — T8610 Unspecified complication of kidney transplant: Secondary | ICD-10-CM | POA: Diagnosis not present

## 2018-07-28 DIAGNOSIS — N186 End stage renal disease: Secondary | ICD-10-CM | POA: Diagnosis not present

## 2018-07-28 DIAGNOSIS — Z992 Dependence on renal dialysis: Secondary | ICD-10-CM | POA: Diagnosis not present

## 2018-07-29 DIAGNOSIS — J14 Pneumonia due to Hemophilus influenzae: Secondary | ICD-10-CM | POA: Diagnosis not present

## 2018-07-29 DIAGNOSIS — Z992 Dependence on renal dialysis: Secondary | ICD-10-CM | POA: Diagnosis not present

## 2018-07-29 DIAGNOSIS — D631 Anemia in chronic kidney disease: Secondary | ICD-10-CM | POA: Diagnosis not present

## 2018-07-29 DIAGNOSIS — N2581 Secondary hyperparathyroidism of renal origin: Secondary | ICD-10-CM | POA: Diagnosis not present

## 2018-07-29 DIAGNOSIS — N186 End stage renal disease: Secondary | ICD-10-CM | POA: Diagnosis not present

## 2018-07-29 DIAGNOSIS — E1029 Type 1 diabetes mellitus with other diabetic kidney complication: Secondary | ICD-10-CM | POA: Diagnosis not present

## 2018-07-29 DIAGNOSIS — D509 Iron deficiency anemia, unspecified: Secondary | ICD-10-CM | POA: Diagnosis not present

## 2018-07-31 DIAGNOSIS — Z992 Dependence on renal dialysis: Secondary | ICD-10-CM | POA: Diagnosis not present

## 2018-07-31 DIAGNOSIS — E1029 Type 1 diabetes mellitus with other diabetic kidney complication: Secondary | ICD-10-CM | POA: Diagnosis not present

## 2018-07-31 DIAGNOSIS — N2581 Secondary hyperparathyroidism of renal origin: Secondary | ICD-10-CM | POA: Diagnosis not present

## 2018-07-31 DIAGNOSIS — N186 End stage renal disease: Secondary | ICD-10-CM | POA: Diagnosis not present

## 2018-07-31 DIAGNOSIS — D631 Anemia in chronic kidney disease: Secondary | ICD-10-CM | POA: Diagnosis not present

## 2018-07-31 DIAGNOSIS — D509 Iron deficiency anemia, unspecified: Secondary | ICD-10-CM | POA: Diagnosis not present

## 2018-08-03 ENCOUNTER — Other Ambulatory Visit: Payer: Self-pay | Admitting: Nephrology

## 2018-08-03 DIAGNOSIS — N2581 Secondary hyperparathyroidism of renal origin: Secondary | ICD-10-CM | POA: Diagnosis not present

## 2018-08-03 DIAGNOSIS — D631 Anemia in chronic kidney disease: Secondary | ICD-10-CM | POA: Diagnosis not present

## 2018-08-03 DIAGNOSIS — Z992 Dependence on renal dialysis: Secondary | ICD-10-CM | POA: Diagnosis not present

## 2018-08-03 DIAGNOSIS — D509 Iron deficiency anemia, unspecified: Secondary | ICD-10-CM | POA: Diagnosis not present

## 2018-08-03 DIAGNOSIS — E1029 Type 1 diabetes mellitus with other diabetic kidney complication: Secondary | ICD-10-CM | POA: Diagnosis not present

## 2018-08-03 DIAGNOSIS — N186 End stage renal disease: Secondary | ICD-10-CM | POA: Diagnosis not present

## 2018-08-05 DIAGNOSIS — E1029 Type 1 diabetes mellitus with other diabetic kidney complication: Secondary | ICD-10-CM | POA: Diagnosis not present

## 2018-08-05 DIAGNOSIS — N2581 Secondary hyperparathyroidism of renal origin: Secondary | ICD-10-CM | POA: Diagnosis not present

## 2018-08-05 DIAGNOSIS — N186 End stage renal disease: Secondary | ICD-10-CM | POA: Diagnosis not present

## 2018-08-05 DIAGNOSIS — I10 Essential (primary) hypertension: Secondary | ICD-10-CM | POA: Diagnosis not present

## 2018-08-05 DIAGNOSIS — E119 Type 2 diabetes mellitus without complications: Secondary | ICD-10-CM | POA: Diagnosis not present

## 2018-08-05 DIAGNOSIS — D509 Iron deficiency anemia, unspecified: Secondary | ICD-10-CM | POA: Diagnosis not present

## 2018-08-05 DIAGNOSIS — R071 Chest pain on breathing: Secondary | ICD-10-CM | POA: Diagnosis not present

## 2018-08-05 DIAGNOSIS — D631 Anemia in chronic kidney disease: Secondary | ICD-10-CM | POA: Diagnosis not present

## 2018-08-05 DIAGNOSIS — Z992 Dependence on renal dialysis: Secondary | ICD-10-CM | POA: Diagnosis not present

## 2018-08-07 DIAGNOSIS — Z992 Dependence on renal dialysis: Secondary | ICD-10-CM | POA: Diagnosis not present

## 2018-08-07 DIAGNOSIS — D509 Iron deficiency anemia, unspecified: Secondary | ICD-10-CM | POA: Diagnosis not present

## 2018-08-07 DIAGNOSIS — N2581 Secondary hyperparathyroidism of renal origin: Secondary | ICD-10-CM | POA: Diagnosis not present

## 2018-08-07 DIAGNOSIS — E1029 Type 1 diabetes mellitus with other diabetic kidney complication: Secondary | ICD-10-CM | POA: Diagnosis not present

## 2018-08-07 DIAGNOSIS — D631 Anemia in chronic kidney disease: Secondary | ICD-10-CM | POA: Diagnosis not present

## 2018-08-07 DIAGNOSIS — N186 End stage renal disease: Secondary | ICD-10-CM | POA: Diagnosis not present

## 2018-08-10 DIAGNOSIS — N2581 Secondary hyperparathyroidism of renal origin: Secondary | ICD-10-CM | POA: Diagnosis not present

## 2018-08-10 DIAGNOSIS — E1029 Type 1 diabetes mellitus with other diabetic kidney complication: Secondary | ICD-10-CM | POA: Diagnosis not present

## 2018-08-10 DIAGNOSIS — N186 End stage renal disease: Secondary | ICD-10-CM | POA: Diagnosis not present

## 2018-08-10 DIAGNOSIS — D509 Iron deficiency anemia, unspecified: Secondary | ICD-10-CM | POA: Diagnosis not present

## 2018-08-10 DIAGNOSIS — Z992 Dependence on renal dialysis: Secondary | ICD-10-CM | POA: Diagnosis not present

## 2018-08-10 DIAGNOSIS — D631 Anemia in chronic kidney disease: Secondary | ICD-10-CM | POA: Diagnosis not present

## 2018-08-12 DIAGNOSIS — D509 Iron deficiency anemia, unspecified: Secondary | ICD-10-CM | POA: Diagnosis not present

## 2018-08-12 DIAGNOSIS — E1029 Type 1 diabetes mellitus with other diabetic kidney complication: Secondary | ICD-10-CM | POA: Diagnosis not present

## 2018-08-12 DIAGNOSIS — N186 End stage renal disease: Secondary | ICD-10-CM | POA: Diagnosis not present

## 2018-08-12 DIAGNOSIS — Z992 Dependence on renal dialysis: Secondary | ICD-10-CM | POA: Diagnosis not present

## 2018-08-12 DIAGNOSIS — D631 Anemia in chronic kidney disease: Secondary | ICD-10-CM | POA: Diagnosis not present

## 2018-08-12 DIAGNOSIS — N2581 Secondary hyperparathyroidism of renal origin: Secondary | ICD-10-CM | POA: Diagnosis not present

## 2018-08-14 DIAGNOSIS — Z992 Dependence on renal dialysis: Secondary | ICD-10-CM | POA: Diagnosis not present

## 2018-08-14 DIAGNOSIS — N2581 Secondary hyperparathyroidism of renal origin: Secondary | ICD-10-CM | POA: Diagnosis not present

## 2018-08-14 DIAGNOSIS — E1029 Type 1 diabetes mellitus with other diabetic kidney complication: Secondary | ICD-10-CM | POA: Diagnosis not present

## 2018-08-14 DIAGNOSIS — N186 End stage renal disease: Secondary | ICD-10-CM | POA: Diagnosis not present

## 2018-08-14 DIAGNOSIS — D631 Anemia in chronic kidney disease: Secondary | ICD-10-CM | POA: Diagnosis not present

## 2018-08-14 DIAGNOSIS — D509 Iron deficiency anemia, unspecified: Secondary | ICD-10-CM | POA: Diagnosis not present

## 2018-08-17 DIAGNOSIS — Z992 Dependence on renal dialysis: Secondary | ICD-10-CM | POA: Diagnosis not present

## 2018-08-17 DIAGNOSIS — N186 End stage renal disease: Secondary | ICD-10-CM | POA: Diagnosis not present

## 2018-08-17 DIAGNOSIS — E1029 Type 1 diabetes mellitus with other diabetic kidney complication: Secondary | ICD-10-CM | POA: Diagnosis not present

## 2018-08-17 DIAGNOSIS — D631 Anemia in chronic kidney disease: Secondary | ICD-10-CM | POA: Diagnosis not present

## 2018-08-17 DIAGNOSIS — N2581 Secondary hyperparathyroidism of renal origin: Secondary | ICD-10-CM | POA: Diagnosis not present

## 2018-08-17 DIAGNOSIS — D509 Iron deficiency anemia, unspecified: Secondary | ICD-10-CM | POA: Diagnosis not present

## 2018-08-18 DIAGNOSIS — K430 Incisional hernia with obstruction, without gangrene: Secondary | ICD-10-CM | POA: Diagnosis not present

## 2018-08-18 DIAGNOSIS — R188 Other ascites: Secondary | ICD-10-CM | POA: Diagnosis not present

## 2018-08-18 DIAGNOSIS — N186 End stage renal disease: Secondary | ICD-10-CM | POA: Diagnosis not present

## 2018-08-19 ENCOUNTER — Observation Stay (HOSPITAL_COMMUNITY): Payer: Medicare Other

## 2018-08-19 ENCOUNTER — Emergency Department (HOSPITAL_COMMUNITY): Payer: Medicare Other

## 2018-08-19 ENCOUNTER — Encounter (HOSPITAL_COMMUNITY): Payer: Self-pay

## 2018-08-19 ENCOUNTER — Observation Stay (HOSPITAL_COMMUNITY)
Admission: EM | Admit: 2018-08-19 | Discharge: 2018-08-21 | Disposition: A | Discharge status: Home / Self Care | Payer: Medicare Other | Attending: Internal Medicine | Admitting: Internal Medicine

## 2018-08-19 DIAGNOSIS — Z992 Dependence on renal dialysis: Secondary | ICD-10-CM | POA: Diagnosis not present

## 2018-08-19 DIAGNOSIS — Z951 Presence of aortocoronary bypass graft: Secondary | ICD-10-CM | POA: Diagnosis not present

## 2018-08-19 DIAGNOSIS — I2510 Atherosclerotic heart disease of native coronary artery without angina pectoris: Secondary | ICD-10-CM | POA: Diagnosis not present

## 2018-08-19 DIAGNOSIS — E1122 Type 2 diabetes mellitus with diabetic chronic kidney disease: Secondary | ICD-10-CM | POA: Insufficient documentation

## 2018-08-19 DIAGNOSIS — I5032 Chronic diastolic (congestive) heart failure: Secondary | ICD-10-CM

## 2018-08-19 DIAGNOSIS — N2581 Secondary hyperparathyroidism of renal origin: Secondary | ICD-10-CM | POA: Diagnosis not present

## 2018-08-19 DIAGNOSIS — D631 Anemia in chronic kidney disease: Secondary | ICD-10-CM | POA: Diagnosis not present

## 2018-08-19 DIAGNOSIS — N186 End stage renal disease: Secondary | ICD-10-CM | POA: Diagnosis present

## 2018-08-19 DIAGNOSIS — R519 Headache, unspecified: Secondary | ICD-10-CM

## 2018-08-19 DIAGNOSIS — G43909 Migraine, unspecified, not intractable, without status migrainosus: Secondary | ICD-10-CM | POA: Diagnosis not present

## 2018-08-19 DIAGNOSIS — I639 Cerebral infarction, unspecified: Secondary | ICD-10-CM | POA: Diagnosis present

## 2018-08-19 DIAGNOSIS — R51 Headache: Secondary | ICD-10-CM | POA: Insufficient documentation

## 2018-08-19 DIAGNOSIS — Z79899 Other long term (current) drug therapy: Secondary | ICD-10-CM | POA: Diagnosis not present

## 2018-08-19 DIAGNOSIS — E1029 Type 1 diabetes mellitus with other diabetic kidney complication: Secondary | ICD-10-CM | POA: Diagnosis not present

## 2018-08-19 DIAGNOSIS — Z7902 Long term (current) use of antithrombotics/antiplatelets: Secondary | ICD-10-CM | POA: Insufficient documentation

## 2018-08-19 DIAGNOSIS — I10 Essential (primary) hypertension: Secondary | ICD-10-CM | POA: Diagnosis not present

## 2018-08-19 DIAGNOSIS — R112 Nausea with vomiting, unspecified: Principal | ICD-10-CM | POA: Diagnosis present

## 2018-08-19 DIAGNOSIS — R29898 Other symptoms and signs involving the musculoskeletal system: Secondary | ICD-10-CM

## 2018-08-19 DIAGNOSIS — Z87891 Personal history of nicotine dependence: Secondary | ICD-10-CM | POA: Insufficient documentation

## 2018-08-19 DIAGNOSIS — E118 Type 2 diabetes mellitus with unspecified complications: Secondary | ICD-10-CM | POA: Diagnosis present

## 2018-08-19 DIAGNOSIS — I4891 Unspecified atrial fibrillation: Secondary | ICD-10-CM | POA: Diagnosis not present

## 2018-08-19 DIAGNOSIS — I120 Hypertensive chronic kidney disease with stage 5 chronic kidney disease or end stage renal disease: Secondary | ICD-10-CM | POA: Diagnosis not present

## 2018-08-19 DIAGNOSIS — I5033 Acute on chronic diastolic (congestive) heart failure: Secondary | ICD-10-CM | POA: Diagnosis present

## 2018-08-19 DIAGNOSIS — I132 Hypertensive heart and chronic kidney disease with heart failure and with stage 5 chronic kidney disease, or end stage renal disease: Secondary | ICD-10-CM | POA: Diagnosis not present

## 2018-08-19 DIAGNOSIS — D509 Iron deficiency anemia, unspecified: Secondary | ICD-10-CM | POA: Diagnosis not present

## 2018-08-19 DIAGNOSIS — E119 Type 2 diabetes mellitus without complications: Secondary | ICD-10-CM

## 2018-08-19 DIAGNOSIS — M6281 Muscle weakness (generalized): Secondary | ICD-10-CM | POA: Diagnosis not present

## 2018-08-19 DIAGNOSIS — Z7982 Long term (current) use of aspirin: Secondary | ICD-10-CM | POA: Diagnosis not present

## 2018-08-19 LAB — COMPREHENSIVE METABOLIC PANEL
ALT: 10 U/L (ref 0–44)
AST: 19 U/L (ref 15–41)
Albumin: 4.1 g/dL (ref 3.5–5.0)
Alkaline Phosphatase: 63 U/L (ref 38–126)
Anion gap: 16 — ABNORMAL HIGH (ref 5–15)
BUN: 24 mg/dL — ABNORMAL HIGH (ref 8–23)
CO2: 22 mmol/L (ref 22–32)
Calcium: 10.5 mg/dL — ABNORMAL HIGH (ref 8.9–10.3)
Chloride: 99 mmol/L (ref 98–111)
Creatinine, Ser: 10.12 mg/dL — ABNORMAL HIGH (ref 0.61–1.24)
GFR calc Af Amer: 6 mL/min — ABNORMAL LOW (ref >60)
GFR calc non Af Amer: 5 mL/min — ABNORMAL LOW (ref >60)
Glucose, Bld: 148 mg/dL — ABNORMAL HIGH (ref 70–99)
Potassium: 4.9 mmol/L (ref 3.5–5.1)
Sodium: 137 mmol/L (ref 135–145)
Total Bilirubin: 0.5 mg/dL (ref 0.3–1.2)
Total Protein: 8.6 g/dL — ABNORMAL HIGH (ref 6.5–8.1)

## 2018-08-19 LAB — CBC
HCT: 41.8 % (ref 39.0–52.0)
Hemoglobin: 12.5 g/dL — ABNORMAL LOW (ref 13.0–17.0)
MCH: 27 pg (ref 26.0–34.0)
MCHC: 29.9 g/dL — ABNORMAL LOW (ref 30.0–36.0)
MCV: 90.3 fL (ref 80.0–100.0)
NRBC: 0 % (ref 0.0–0.2)
Platelets: 176 10*3/uL (ref 150–400)
RBC: 4.63 MIL/uL (ref 4.22–5.81)
RDW: 17.9 % — ABNORMAL HIGH (ref 11.5–15.5)
WBC: 6.6 10*3/uL (ref 4.0–10.5)

## 2018-08-19 LAB — I-STAT TROPONIN, ED: Troponin i, poc: 0.09 ng/mL (ref 0.00–0.08)

## 2018-08-19 LAB — DIFFERENTIAL
Abs Immature Granulocytes: 0.03 10*3/uL (ref 0.00–0.07)
Basophils Absolute: 0.1 10*3/uL (ref 0.0–0.1)
Basophils Relative: 1 %
Eosinophils Absolute: 0.2 10*3/uL (ref 0.0–0.5)
Eosinophils Relative: 4 %
Immature Granulocytes: 1 %
Lymphocytes Relative: 12 %
Lymphs Abs: 0.8 10*3/uL (ref 0.7–4.0)
Monocytes Absolute: 0.4 10*3/uL (ref 0.1–1.0)
Monocytes Relative: 6 %
Neutro Abs: 5.1 10*3/uL (ref 1.7–7.7)
Neutrophils Relative %: 76 %

## 2018-08-19 LAB — APTT: aPTT: 34 seconds (ref 24–36)

## 2018-08-19 LAB — PROTIME-INR
INR: 1.06
Prothrombin Time: 13.7 seconds (ref 11.4–15.2)

## 2018-08-19 MED ORDER — ASPIRIN 325 MG PO TABS: 325.0000 mg | ORAL_TABLET | Freq: Every day | ORAL | Status: DC

## 2018-08-19 MED ORDER — BACLOFEN 10 MG PO TABS
10.0000 mg | ORAL_TABLET | Freq: Two times a day (BID) | ORAL | Status: DC
  Filled 2018-08-19 (×5): qty 1

## 2018-08-19 MED ORDER — LORAZEPAM 1 MG PO TABS
1.0000 mg | ORAL_TABLET | Freq: Once | ORAL | Status: AC | PRN
  Administered 2018-08-19: 1 mg via ORAL
  Filled 2018-08-19: qty 1

## 2018-08-19 MED ORDER — EZETIMIBE 10 MG PO TABS
10.0000 mg | ORAL_TABLET | Freq: Every day | ORAL | Status: DC
  Administered 2018-08-20: 10 mg via ORAL
  Filled 2018-08-19 (×2): qty 1

## 2018-08-19 MED ORDER — ACETAMINOPHEN 650 MG RE SUPP: 650.0000 mg | RECTAL | Status: DC | PRN

## 2018-08-19 MED ORDER — ASPIRIN EC 81 MG PO TBEC
81.0000 mg | DELAYED_RELEASE_TABLET | Freq: Every day | ORAL | Status: DC
  Administered 2018-08-20 – 2018-08-21 (×2): 81 mg via ORAL
  Filled 2018-08-19 (×2): qty 1

## 2018-08-19 MED ORDER — ACETAMINOPHEN 160 MG/5ML PO SOLN: 650.0000 mg | ORAL | Status: DC | PRN

## 2018-08-19 MED ORDER — CALCIUM ACETATE (PHOS BINDER) 667 MG PO CAPS
1334.0000 mg | ORAL_CAPSULE | Freq: Three times a day (TID) | ORAL | Status: DC
  Administered 2018-08-20 – 2018-08-21 (×4): 1334 mg via ORAL
  Filled 2018-08-19 (×4): qty 2

## 2018-08-19 MED ORDER — REPAGLINIDE 0.5 MG PO TABS
0.5000 mg | ORAL_TABLET | Freq: Two times a day (BID) | ORAL | Status: DC
  Administered 2018-08-20 – 2018-08-21 (×3): 0.5 mg via ORAL
  Filled 2018-08-19: qty 1
  Filled 2018-08-19: qty 0.5
  Filled 2018-08-19 (×2): qty 1

## 2018-08-19 MED ORDER — ALBUTEROL SULFATE HFA 108 (90 BASE) MCG/ACT IN AERS: 1.0000 | INHALATION_SPRAY | Freq: Four times a day (QID) | RESPIRATORY_TRACT | Status: DC | PRN

## 2018-08-19 MED ORDER — ACETAMINOPHEN 325 MG PO TABS: 650.0000 mg | ORAL_TABLET | ORAL | Status: DC | PRN

## 2018-08-19 MED ORDER — CLOPIDOGREL BISULFATE 75 MG PO TABS
75.0000 mg | ORAL_TABLET | Freq: Every day | ORAL | Status: DC
  Administered 2018-08-20 – 2018-08-21 (×2): 75 mg via ORAL
  Filled 2018-08-19 (×2): qty 1

## 2018-08-19 MED ORDER — ALBUTEROL SULFATE (2.5 MG/3ML) 0.083% IN NEBU
2.5000 mg | INHALATION_SOLUTION | Freq: Four times a day (QID) | RESPIRATORY_TRACT | Status: DC | PRN
  Filled 2018-08-19: qty 3

## 2018-08-19 MED ORDER — HEPARIN SODIUM (PORCINE) 5000 UNIT/ML IJ SOLN
5000.0000 [IU] | Freq: Three times a day (TID) | INTRAMUSCULAR | Status: DC
  Administered 2018-08-19 – 2018-08-21 (×5): 5000 [IU] via SUBCUTANEOUS
  Filled 2018-08-19 (×5): qty 1

## 2018-08-19 MED ORDER — STROKE: EARLY STAGES OF RECOVERY BOOK
Freq: Once | Status: AC
  Administered 2018-08-19
  Filled 2018-08-19: qty 1

## 2018-08-19 MED ORDER — ASPIRIN 300 MG RE SUPP: 300.0000 mg | Freq: Every day | RECTAL | Status: DC

## 2018-08-19 NOTE — Progress Notes (Signed)
Received from ED via stretcher.  Transferred to bed walking, gait steady.  Oriented to room, safety precautions & plan of care.  Stroke ed. Began with patient verbalizing understanding.  See flow sheet for assessment.

## 2018-08-19 NOTE — ED Notes (Signed)
  Attempted to call report.  RN will call me back. 

## 2018-08-19 NOTE — ED Provider Notes (Addendum)
[facility] EMERGENCY DEPARTMENT Provider Note   CSN: [id number] Arrival date & time: 08/19/18  1652     History   Chief Complaint Chief Complaint  Patient presents with  . Emesis  . Headache  . Dizziness    HPI Patrick [name] is a 63 y.o. male.  HPI Patient presents with nausea vomiting dizziness headache.  Had a headache that began last night but states today he developed some weakness in the left arm.  States he will hold up both of his arms and his left arm would not stay up his arm.  No difficulty using his left leg.  States that has improved somewhat now in his arm.  Some mild neck pain.  Headache is dull.  Does not tend to get headaches.  History of dialysis and was dialyzed today.  Able to do a full dialysis. Past Medical History:  Diagnosis Date  . Arthritis    "back" (09/24/2016)  . CHF (congestive heart failure) ([place])   . Coronary artery disease   . Dysrhythmia    aflutter s/p ablation 2018  . ESRD (end stage renal disease) on dialysis [facility])    "TTS; [place]" (09/24/2016)  . Gout   . Hernia of abdominal wall   . History of blood transfusion 2009- 2016   "I've had several; low HgB"  . History of kidney stones    treated with nephrectomy  . Hypertension   . Impotence of organic origin   . Migraine    "stopped in my 30's; they were related to high BP" (09/24/2016)  . Myocardial infarction [facility]) '96  . Pneumonia 01/2015   " a touch & I was in the hosp."  . Secondary hyperparathyroidism ([place])   . Type 2 diabetes mellitus (HCC)    no longer on medication since going on dialysis  . Wears glasses     Patient Active Problem List   Diagnosis Date Noted  . Chronic anemia 05/12/2018  . Hypokalemia 05/12/2018  . Acute combined systolic and diastolic congestive heart failure ([place]) 05/12/2018  . Hyperlipidemia 05/12/2018  . Physical deconditioning 05/12/2018  . Multifocal pneumonia 05/11/2018  . Allergy to IVP dye, subsequent encounter   .  CAD (coronary artery disease) 11/27/2017  . Atherosclerosis of native arteries of extremity with intermittent claudication ([place]) 04/03/2017  . PAD (peripheral artery disease) ([place]) 02/02/2017  . Typical atrial flutter ([place]) 09/24/2016  . PAF (paroxysmal atrial fibrillation) ([place]) 05/25/2016  . ESRD (end stage renal disease) ([place]) 05/11/2016  . Anemia of chronic disease   . End-stage renal disease on hemodialysis ([place])   . Renovascular hypertension   . History of MI (myocardial infarction)   . Acute respiratory failure with hypoxia ([place])   . Controlled diabetes mellitus type 2 with complications ([place])   . Pleural effusion   . Symptomatic anemia 01/22/2016  . Acute respiratory failure ([place]) 01/22/2016  . Chronic diastolic CHF (congestive heart failure) ([place]) 01/22/2016  . Elevated troponin 01/22/2016  . Febrile illness 01/22/2016  . Normocytic anemia 11/17/2015  . Gynecomastia 11/17/2015  . Renal stone 11/17/2015  . Cough 11/17/2015  . CAP (community acquired pneumonia)   . HCAP (healthcare-associated pneumonia) 10/11/2015  . Acute respiratory disease 10/11/2015  . Protein-calorie malnutrition, severe ([place]) 09/16/2014  . 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 Metabolic acidosis, NAG, failure of bicarbonate regeneration 09/14/2014  . Anemia in chronic kidney disease 11/24/2013  . 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 Hypocalcemia 11/24/2013  . Low bicarbonate 11/24/2013  .  Diabetes mellitus type 2, controlled ([place]) 10/20/2013  . Essential hypertension 03/19/2013  . Acute gout 03/19/2013  . Impotence of organic origin 03/19/2013  . Secondary hyperparathyroidism ([place]) 03/19/2013  . End-stage renal failure with renal transplant ([place]) 03/19/2013  . 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 A-FLUTTER ABLATION N/A 09/24/2016   Procedure: A-Flutter Ablation;  Surgeon: Will Meredith Leeds, MD;  Location: [facility];  Service: Cardiovascular;  Laterality: N/A;  . ABDOMINAL AORTOGRAM W/LOWER EXTREMITY N/A 04/13/2017   Procedure: ABDOMINAL AORTOGRAM W/LOWER EXTREMITY;  Surgeon: Conrad Wolfdale, MD;  Location: [place] [facility];  Service: Cardiovascular;  Laterality: N/A;  Bilater lower extermity  . APPENDECTOMY    . AV FISTULA PLACEMENT Right 09/18/2014   Procedure: INSERTION OF ARTERIOVENOUS (AV) GORE-TEX GRAFT ARM USING 4-7MM  X 45CM STRETCH GORE-TEX VASCULAR GRAFT;  Surgeon: Rosetta Posner, MD;  Location: [facility];  Service: Vascular;  Laterality: Right;  . AV FISTULA PLACEMENT Left 07/07/2016   Procedure: INSERTION OF LEFT BRACHIAL TO AXILLARY ARTERIOVENOUS (AV) GORE-TEX ARM GRAFT;  Surgeon: Conrad Biwabik, MD;  Location: [place];  Service: Vascular;  Laterality: Left;  . CARDIAC CATHETERIZATION  ~ 2016  . COLONOSCOPY    . 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 EXCHANGE OF A DIALYSIS CATHETER Left 01/11/2018   Procedure: EXCHANGE OF TUNNELED DIALYSIS CATHETER;  Surgeon: Rosetta Posner, MD;  Location: [place];  Service: Vascular;  Laterality: Left;  . HERNIA REPAIR  2017   with nephrectomy  . INSERTION OF DIALYSIS CATHETER N/A 10/08/2017   Procedure: INSERTION OF TUNNELED DIALYSIS CATHETER;  Surgeon: Conrad [place]ville, MD;  Location: [place];  Service: Vascular;  Laterality: N/A;  . IR FLUORO GUIDE CV LINE LEFT  03/12/2018  . IR GENERIC HISTORICAL  05/11/2016   IR FLUORO GUIDE CV LINE LEFT 05/11/2016 Corrie Mckusick, DO [facility]-INTERV [facility]  . IR GENERIC HISTORICAL  05/11/2016   IR US GUIDE VASC ACCESS LEFT 05/11/2016 Corrie Mckusick, DO [facility]  . IR GENERIC HISTORICAL  05/11/2016   IR US GUIDE VASC ACCESS RIGHT 05/11/2016 Corrie Mckusick, DO [facility]  . IR GENERIC HISTORICAL  05/11/2016   IR RADIOLOGY PERIPHERAL GUIDED IV START 05/11/2016 Corrie Mckusick, DO [facility]-INTER[facility]  . KIDNEY TRANSPLANT  2009  . LEFT  HEART CATH AND CORS/GRAFTS ANGIOGRAPHY N/A 12/04/2017   Procedure: LEFT HEART CATH AND CORS/GRAFTS ANGIOGRAPHY;  Surgeon: Belva Crome, MD;  Location: [place] [facility];  Service: Cardiovascular;  Laterality: N/A;  . NEPHRECTOMY  2017   transplant rejected   . PERIPHERAL VASCULAR CATHETERIZATION N/A 06/04/2016   Procedure: Upper Extremity Venography;  Surgeon: Waynetta Sandy, MD;  Location: [place] [facility];  Service: Cardiovascular;  Laterality: N/A;  . PERIPHERAL VASCULAR INTERVENTION  04/13/2017   Procedure: PERIPHERAL VASCULAR INTERVENTION;  Surgeon: Conrad Zimmerman, MD;  Location: [place] [facility];  Service: Cardiovascular;;  Lt. Common/Exernal  Iliac  . THROMBECTOMY AND REVISION OF ARTERIOVENTOUS (AV) GORETEX  GRAFT Left 10/08/2017   Procedure: THROMBECTOMY of ARTERIOVENTOUS (AV) GORETEX  GRAFT LEFT UPPER ARM;  Surgeon: Conrad Palisade, MD;  Location: [place];  Service: Vascular;  Laterality: Left;  . THROMBECTOMY W/ EMBOLECTOMY Left 09/14/2017   Procedure: THROMBECTOMY ARTERIOVENOUS GORE-TEX GRAFT LEFT UPPER ARM;  Surgeon: Rosetta Posner,  MD;  Location: [place];  Service: Vascular;  Laterality: Left;  . UPPER EXTREMITY VENOGRAPHY N/A 11/16/2017   Procedure: UPPER EXTREMITY VENOGRAPHY - Right Arm;  Surgeon: Conrad Menifee, MD;  Location: [place] [facility];  Service: Cardiovascular;  Laterality: N/A;        Home Medications    Prior to Admission medications   Medication Sig Start Date End Date Taking? Authorizing Provider  acetaminophen (TYLENOL) 500 MG tablet Take 1,000 mg by mouth every 6 (six) hours as needed for moderate pain or headache.    [provider]  albuterol (PROVENTIL HFA;VENTOLIN HFA) 108 (90 Base) MCG/ACT inhaler Inhale 1-2 puffs into the lungs every 6 (six) hours as needed for wheezing or shortness of breath. 12/18/17   Richardson Dopp T, PA-C  amLODipine (NORVASC) 5 MG tablet Take 1 tablet (5 mg total) by mouth daily. 11/27/17 05/11/18  Richardson Dopp T, PA-C    aspirin EC 81 MG tablet Take 81 mg by mouth daily.    [provider]  calcium acetate (PHOSLO) 667 MG capsule Take 1,334 mg by mouth daily at 12 noon.     [provider]  Ceftazidime (FORTAZ) 2 g SOLR injection To be given with dialysis for 2 weeks 05/18/18   Bonnielee Haff, MD  clopidogrel (PLAVIX) 75 MG tablet Take 1 tablet (75 mg total) by mouth daily. 12/18/17   Richardson Dopp T, PA-C  ezetimibe (ZETIA) 10 MG tablet Take 1 tablet (10 mg total) by mouth daily. 04/02/18 07/01/18  Richardson Dopp T, PA-C  guaiFENesin (MUCINEX) 600 MG 12 hr tablet Take 1 tablet (600 mg total) by mouth 2 (two) times daily. 05/18/18   Bonnielee Haff, MD  guaiFENesin (ROBITUSSIN) 100 MG/5ML SOLN Take 5 mLs (100 mg total) by mouth every 4 (four) hours as needed for cough or to loosen phlegm. 05/18/18   Bonnielee Haff, MD  isosorbide mononitrate (IMDUR) 60 MG 24 hr tablet Take 1.5 tablets (90 mg total) by mouth daily. 04/06/18 07/05/18  Richardson Dopp T, PA-C  multivitamin (RENA-VIT) TABS tablet Take 1 tablet by mouth at bedtime. 05/18/18   Bonnielee Haff, MD  predniSONE (DELTASONE) 50 MG tablet Take 12 hours prior to cath, 6 hours prior and 1 hour prior 11/27/17   Richardson Dopp T, PA-C  repaglinide (PRANDIN) 0.5 MG tablet Take 1 tablet (0.5 mg total) by mouth 2 (two) times daily before a meal. 04/05/18   Renato Shin, MD  rosuvastatin (CRESTOR) 10 MG tablet Take 10 mg by mouth daily. 06/16/18   [provider]  traMADol (ULTRAM) 50 MG tablet Take 1 tablet (50 mg total) by mouth every 6 (six) hours as needed for moderate pain. 10/08/17   Gabriel Earing, PA-C    Family History Family History  Problem Relation Age of Onset  . Hyperlipidemia Mother   . Hypertension Mother   . HIV Sister     Social History Social History   Tobacco Use  . Smoking status: Former Smoker    Types: Cigarettes    Last attempt to quit: 07/04/2014    Years since quitting: 4.1  . Smokeless tobacco: Never Used  .  Tobacco comment: "smoked ~ 1 pack/month when I did smoke; never a steady smoker"  Substance Use Topics  . Alcohol use: Yes    Alcohol/week: 0.0 standard drinks    Comment: rare  "2 drinks, 1-2 times/year"  . Drug use: No     Allergies   Iodinated diagnostic agents; Lipitor [atorvastatin]; and Metoprolol  Review of Systems Review of Systems  Constitutional: Negative for appetite change.  HENT: Negative for congestion.   Respiratory: Negative for choking.   Genitourinary: Negative for flank pain.  Musculoskeletal: Negative for back pain.  Skin: Negative for rash.  Neurological: Positive for weakness, numbness and headaches.  Psychiatric/Behavioral: Negative for confusion.     Physical Exam Updated Vital Signs BP (!) 161/91   Pulse (!) 57   Temp 97.6 F (36.4 C) (Oral)   Resp 16   SpO2 99%   Physical Exam HENT:     Head: Atraumatic.  Eyes:     Pupils: Pupils are equal, round, and reactive to light.  Neck:     Musculoskeletal: Neck supple.  Cardiovascular:     Rate and Rhythm: Normal rate.  Pulmonary:     Effort: No respiratory distress.  Abdominal:     Palpations: Abdomen is soft.  Musculoskeletal: Normal range of motion.  Skin:    General: Skin is warm.  Neurological:     Mental Status: He is alert and oriented to person, place, and time.     Cranial Nerves: No cranial nerve deficit.     Comments: Good grip strength bilaterally.  Good flexion and extension in upper and lower extremities.  Face symmetric.  Normal speech.  With both arms held out left arm does lower slightly, however there is no pronation or supination with it.  Psychiatric:        Mood and Affect: Mood normal.   Dialysis catheter left upper chest wall.  No edema of left upper extremity.   ED Treatments / Results  Labs (all labs ordered are listed, but only abnormal results are displayed) Labs Reviewed  CBC - Abnormal; Notable for the following components:      Result Value   Hemoglobin  12.5 (*)    MCHC 29.9 (*)    RDW 17.9 (*)    All other components within normal limits  COMPREHENSIVE METABOLIC PANEL - Abnormal; Notable for the following components:   Glucose, Bld 148 (*)    BUN 24 (*)    Creatinine, Ser 10.12 (*)    Calcium 10.5 (*)    Total Protein 8.6 (*)    GFR calc non Af Amer 5 (*)    GFR calc Af Amer 6 (*)    Anion gap 16 (*)    All other components within normal limits  I-STAT TROPONIN, ED - Abnormal; Notable for the following components:   Troponin i, poc 0.09 (*)    All other components within normal limits  PROTIME-INR  APTT  DIFFERENTIAL    EKG EKG Interpretation  Date/Time:  Thursday August 19 2018 17:01:13 EST Ventricular Rate:  79 PR Interval:  166 QRS Duration: 78 QT Interval:  390 QTC Calculation: 447 R Axis:   128 Text Interpretation:  Sinus rhythm with marked sinus arrhythmia Right axis deviation Nonspecific T wave abnormality Abnormal ECG No significant change since last tracing Confirmed by Davonna Belling [phone]) on 08/19/2018 6:12:31 PM   Radiology Ct Head Wo Contrast  Result Date: 08/19/2018 CLINICAL DATA:  Headache and dizziness EXAM: CT HEAD WITHOUT CONTRAST TECHNIQUE: Contiguous axial images were obtained from the base of the skull through the vertex without intravenous contrast. COMPARISON:  None. FINDINGS: BRAIN: The ventricles and sulci are normal. Minimal small vessel ischemic disease of periventricular white matter. No intraparenchymal hemorrhage, mass effect nor midline shift. No acute large vascular territory infarcts. Grey-white matter distinction is maintained. The basal ganglia are unremarkable.  No abnormal extra-axial fluid collections. Basal cisterns are not effaced and midline. The brainstem and cerebellar hemispheres are without acute abnormalities. VASCULAR: No hyperdense vessel or unexpected calcification. Mild-to-moderate atherosclerosis of the carotid siphons bilaterally. SKULL/SOFT TISSUES: No skull fracture. No  significant soft tissue swelling. ORBITS/SINUSES: The included ocular globes and orbital contents are normal.The mastoid air cells are clear. The included paranasal sinuses are well-aerated. OTHER: None. IMPRESSION: Likely chronic minimal small vessel ischemic disease of periventricular white matter. No acute intracranial abnormality. Electronically Signed   By: Ashley Royalty M.D.   On: 08/19/2018 19:34    Procedures Procedures (including critical care time)  Medications Ordered in ED Medications - No data to display   Initial Impression / Assessment and Plan / ED Course  I have reviewed the triage vital signs and the nursing notes.  Pertinent labs & imaging results that were available during my care of the patient were reviewed by me and considered in my medical decision making (see chart for details).     Patient with headache and left upper extremity weakness.  No specific neck pain.  Head CT shows no acute abnormality.  However I think he needs further evaluation of this potential weakness.  He is a dialysis patient.  Admit to hospitalist Troponin is mildly elevated, however he has no chest pain.  I think this is more likely just from his dialysis.  Final Clinical Impressions(s) / ED Diagnoses   Final diagnoses:  Weakness of left upper extremity  Nonintractable headache, unspecified chronicity pattern, unspecified headache type  End stage renal disease on dialysis [facility])    ED Discharge Orders    None       Davonna Belling, MD 08/19/18 2106    Davonna Belling, MD 08/19/18 2108

## 2018-08-19 NOTE — ED Triage Notes (Signed)
Pt endorses n/v, dizziness, headache and difficulty using left arm that began yesterday at 1700. LSN yesterday at 1700. No neuro deficits appreciated by this RN.

## 2018-08-19 NOTE — H&P (Signed)
History and Physical    Patrick [name] CBU:[id number] DOB: October 20, 1955 DOA: 08/19/2018  PCP: Nolene Ebbs, MD  Patient coming from: Home  I have personally briefly reviewed patient's old medical records in [place]  Chief Complaint: L arm weakness  HPI: Patrick [name] is a 63 y.o. male with medical history significant of ESRD on dialysis TTS had dialysis today, diastolic CHF, A.Flutter s/p ablation in 2018, DM2, HTN.  Patient has headache onset last night, today developed weakness in L arm.  Was able to do full dialysis today.  But this evening having weakness in L arm.  No speech problems nor difficulty with L leg.   ED Course: CT head neg.  Hospitalist asked to admit for TIA vs Stroke, pt says L arm weakness improved / resolved at this time.   Review of Systems: As per HPI otherwise 10 point review of systems negative.   Past Medical History:  Diagnosis Date  . Arthritis    "back" (09/24/2016)  . CHF (congestive heart failure) ([place])   . Coronary artery disease   . Dysrhythmia    aflutter s/p ablation 2018  . ESRD (end stage renal disease) on dialysis [facility])    "TTS; [place]" (09/24/2016)  . Gout   . Hernia of abdominal wall   . History of blood transfusion 2009- 2016   "I've had several; low HgB"  . History of kidney stones    treated with nephrectomy  . Hypertension   . Impotence of organic origin   . Migraine    "stopped in my 30's; they were related to high BP" (09/24/2016)  . Myocardial infarction [facility] '96  . Pneumonia 01/2015   " a touch & I was in the hosp."  . Secondary hyperparathyroidism ([place])   . Type 2 diabetes mellitus (HCC)    no longer on medication since going on dialysis  . Wears glasses     Past Surgical History:  Procedure Laterality Date  . A-FLUTTER ABLATION N/A 09/24/2016   Procedure: A-Flutter Ablation;  Surgeon: Will Meredith Leeds, MD;  Location: [place] CV LAB;  Service: Cardiovascular;  Laterality: N/A;  . ABDOMINAL  AORTOGRAM W/LOWER EXTREMITY N/A 04/13/2017   Procedure: ABDOMINAL AORTOGRAM W/LOWER EXTREMITY;  Surgeon: Conrad [place], MD;  Location: [place] [facility];  Service: Cardiovascular;  Laterality: N/A;  Bilater lower extermity  . APPENDECTOMY    . AV FISTULA PLACEMENT Right 09/18/2014   Procedure: INSERTION OF ARTERIOVENOUS (AV) GORE-TEX GRAFT ARM USING 4-7MM  X 45CM STRETCH GORE-TEX VASCULAR GRAFT;  Surgeon: Rosetta Posner, MD;  Location: [place];  Service: Vascular;  Laterality: Right;  . AV FISTULA PLACEMENT Left 07/07/2016   Procedure: INSERTION OF LEFT BRACHIAL TO AXILLARY ARTERIOVENOUS (AV) GORE-TEX ARM GRAFT;  Surgeon: Conrad Freestone, MD;  Location: [place];  Service: Vascular;  Laterality: Left;  . CARDIAC CATHETERIZATION  ~ 2016  . COLONOSCOPY    . 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 EXCHANGE OF A DIALYSIS CATHETER Left 01/11/2018   Procedure: EXCHANGE OF TUNNELED DIALYSIS CATHETER;  Surgeon: Rosetta Posner, MD;  Location: [place];  Service: Vascular;  Laterality: Left;  . HERNIA REPAIR  2017   with nephrectomy  . INSERTION OF DIALYSIS CATHETER N/A 10/08/2017   Procedure: INSERTION OF TUNNELED DIALYSIS CATHETER;  Surgeon: Conrad Scarville, MD;  [facility];  Service: Vascular;  Laterality: N/A;  . IR FLUORO  GUIDE CV LINE LEFT  03/12/2018  . IR GENERIC HISTORICAL  05/11/2016   IR FLUORO GUIDE CV LINE LEFT 05/11/2016 Corrie Mckusick, DO [facility]-INTERV [facility]  . IR GENERIC HISTORICAL  05/11/2016   IR US GUIDE VASC ACCESS LEFT 05/11/2016 Corrie Mckusick, DO [facility]  . IR GENERIC HISTORICAL  05/11/2016   IR US GUIDE VASC ACCESS RIGHT 05/11/2016 Corrie Mckusick, DO [facility]  . IR GENERIC HISTORICAL  05/11/2016   IR RADIOLOGY PERIPHERAL GUIDED IV START 05/11/2016 Corrie Mckusick, DO [facility]-INTER[facility]  . KIDNEY TRANSPLANT  2009  . LEFT HEART CATH AND CORS/GRAFTS ANGIOGRAPHY N/A 12/04/2017   Procedure: LEFT HEART CATH AND CORS/GRAFTS  ANGIOGRAPHY;  Surgeon: Belva Crome, MD;  Location: [place] [facility];  Service: Cardiovascular;  Laterality: N/A;  . NEPHRECTOMY  2017   transplant rejected   . PERIPHERAL VASCULAR CATHETERIZATION N/A 06/04/2016   Procedure: Upper Extremity Venography;  Surgeon: Waynetta Sandy, MD;  Location: [place] [facility];  Service: Cardiovascular;  Laterality: N/A;  . PERIPHERAL VASCULAR INTERVENTION  04/13/2017   Procedure: PERIPHERAL VASCULAR INTERVENTION;  Surgeon: Conrad [place], MD;  Location: [place] [facility];  Service: Cardiovascular;;  Lt. Common/Exernal  Iliac  . THROMBECTOMY AND REVISION OF ARTERIOVENTOUS (AV) GORETEX  GRAFT Left 10/08/2017   Procedure: THROMBECTOMY of ARTERIOVENTOUS (AV) GORETEX  GRAFT LEFT UPPER ARM;  Surgeon: Conrad Chemung, MD;  Location: [place];  Service: Vascular;  Laterality: Left;  . THROMBECTOMY W/ EMBOLECTOMY Left 09/14/2017   Procedure: THROMBECTOMY ARTERIOVENOUS GORE-TEX GRAFT LEFT UPPER ARM;  Surgeon: Rosetta Posner, MD;  Location: [place];  Service: Vascular;  Laterality: Left;  . UPPER EXTREMITY VENOGRAPHY N/A 11/16/2017   Procedure: UPPER EXTREMITY VENOGRAPHY - Right Arm;  Surgeon: Conrad Nodaway, MD;  Location: [place] [facility];  Service: Cardiovascular;  Laterality: N/A;     reports that he quit smoking about 4 years ago. His smoking use included cigarettes. He has never used smokeless tobacco. He reports current alcohol use. He reports that he does not use drugs.  Allergies  Allergen Reactions  . Iodinated Diagnostic Agents Swelling, Rash and Other (See Comments)    Other Reaction: burning to mouth, swelling of lips. Other Reaction: burning to mouth, swelling of lips.  . Lipitor [Atorvastatin] Other (See Comments)    Leg pain  . Metoprolol Other (See Comments)    Headaches     Family History  Problem Relation Age of Onset  . Hyperlipidemia Mother   . Hypertension Mother   . HIV Sister      Prior to Admission medications   Medication Sig  Start Date End Date Taking? Authorizing Provider  acetaminophen (TYLENOL) 500 MG tablet Take 1,000 mg by mouth every 6 (six) hours as needed for moderate pain or headache.   Yes [provider]  albuterol (PROVENTIL HFA;VENTOLIN HFA) 108 (90 Base) MCG/ACT inhaler Inhale 1-2 puffs into the lungs every 6 (six) hours as needed for wheezing or shortness of breath. 12/18/17  Yes Weaver, Scott T, PA-C  amLODipine (NORVASC) 5 MG tablet Take 1 tablet (5 mg total) by mouth daily. 11/27/17 08/19/26 Yes Richardson Dopp T, PA-C  aspirin EC 81 MG tablet Take 81 mg by mouth daily.   Yes [provider]  baclofen (LIORESAL) 10 MG tablet Take 10 mg by mouth 2 (two) times daily. 08/05/18  Yes [provider]  calcium acetate (PHOSLO) 667 MG capsule Take 1,334 mg by mouth 3 (three) times daily with meals.  Yes [provider]  clopidogrel (PLAVIX) 75 MG tablet Take 1 tablet (75 mg total) by mouth daily. 12/18/17  Yes Weaver, Scott T, PA-C  ezetimibe (ZETIA) 10 MG tablet Take 1 tablet (10 mg total) by mouth daily. 04/02/18 08/19/26 Yes Weaver, Scott T, PA-C  isosorbide mononitrate (IMDUR) 60 MG 24 hr tablet Take 1.5 tablets (90 mg total) by mouth daily. 04/06/18 08/19/26 Yes Weaver, Scott T, PA-C  repaglinide (PRANDIN) 0.5 MG tablet Take 1 tablet (0.5 mg total) by mouth 2 (two) times daily before a meal. 04/05/18  Yes Renato Shin, MD  Ceftazidime (FORTAZ) 2 g SOLR injection To be given with dialysis for 2 weeks Patient not taking: Reported on 08/19/2018 05/18/18   Bonnielee Haff, MD  guaiFENesin (MUCINEX) 600 MG 12 hr tablet Take 1 tablet (600 mg total) by mouth 2 (two) times daily. Patient not taking: Reported on 08/19/2018 05/18/18   Bonnielee Haff, MD  guaiFENesin (ROBITUSSIN) 100 MG/5ML SOLN Take 5 mLs (100 mg total) by mouth every 4 (four) hours as needed for cough or to loosen phlegm. Patient not taking: Reported on 08/19/2018 05/18/18   Bonnielee Haff, MD  multivitamin (RENA-VIT) TABS  tablet Take 1 tablet by mouth at bedtime. Patient not taking: Reported on 08/19/2018 05/18/18   Bonnielee Haff, MD  predniSONE (DELTASONE) 50 MG tablet Take 12 hours prior to cath, 6 hours prior and 1 hour prior Patient not taking: Reported on 08/19/2018 11/27/17   Richardson Dopp T, PA-C  traMADol (ULTRAM) 50 MG tablet Take 1 tablet (50 mg total) by mouth every 6 (six) hours as needed for moderate pain. Patient not taking: Reported on 08/19/2018 10/08/17   Gabriel Earing, PA-C    Physical Exam: Vitals:   08/19/18 1932 08/19/18 2000 08/19/18 2045 08/19/18 2115  BP: (!) 168/111 (!) 170/90 (!) 161/91 132/67  Pulse: 64 (!) 54 (!) 57 (!) 56  Resp: 15 17 16 16   Temp:      TempSrc:      SpO2: 97% 99% 99% 99%    Constitutional: NAD, calm, comfortable Eyes: PERRL, lids and conjunctivae normal ENMT: Mucous membranes are moist. Posterior pharynx clear of any exudate or lesions.Normal dentition.  Neck: normal, supple, no masses, no thyromegaly Respiratory: clear to auscultation bilaterally, no wheezing, no crackles. Normal respiratory effort. No accessory muscle use.  Cardiovascular: Regular rate and rhythm, no murmurs / rubs / gallops. No extremity edema. 2+ pedal pulses. No carotid bruits.  Abdomen: no tenderness, no masses palpated. No hepatosplenomegaly. Bowel sounds positive.  Musculoskeletal: no clubbing / cyanosis. No joint deformity upper and lower extremities. Good ROM, no contractures. Normal muscle tone.  Skin: no rashes, lesions, ulcers. No induration Neurologic:  Good grip strength bilaterally.  Good flexion and extension in upper and lower extremities.  Face symmetric.  Normal speech.  With both arms held out left arm does lower slightly, however there is no pronation or supination with it.  Psychiatric: Normal judgment and insight. Alert and oriented x 3. Normal mood.    Labs on Admission: I have personally reviewed following labs and imaging studies  CBC: Recent Labs  Lab  08/19/18 1716  WBC 6.6  NEUTROABS 5.1  HGB 12.5*  HCT 41.8  MCV 90.3  PLT 102   Basic Metabolic Panel: Recent Labs  Lab 08/19/18 1716  NA 137  K 4.9  CL 99  CO2 22  GLUCOSE 148*  BUN 24*  CREATININE 10.12*  CALCIUM 10.5*   GFR: CrCl cannot be calculated (Unknown  ideal weight.). Liver Function Tests: Recent Labs  Lab 08/19/18 1716  AST 19  ALT 10  ALKPHOS 63  BILITOT 0.5  PROT 8.6*  ALBUMIN 4.1   No results for input(s): LIPASE, AMYLASE in the last 168 hours. No results for input(s): AMMONIA in the last 168 hours. Coagulation Profile: Recent Labs  Lab 08/19/18 1716  INR 1.06   Cardiac Enzymes: No results for input(s): CKTOTAL, CKMB, CKMBINDEX, TROPONINI in the last 168 hours. BNP (last 3 results) No results for input(s): PROBNP in the last 8760 hours. HbA1C: No results for input(s): HGBA1C in the last 72 hours. CBG: No results for input(s): GLUCAP in the last 168 hours. Lipid Profile: No results for input(s): CHOL, HDL, LDLCALC, TRIG, CHOLHDL, LDLDIRECT in the last 72 hours. Thyroid Function Tests: No results for input(s): TSH, T4TOTAL, FREET4, T3FREE, THYROIDAB in the last 72 hours. Anemia Panel: No results for input(s): VITAMINB12, FOLATE, FERRITIN, TIBC, IRON, RETICCTPCT in the last 72 hours. Urine analysis:    Component Value Date/Time   COLORURINE RED (A) 11/17/2015 0910   APPEARANCEUR TURBID (A) 11/17/2015 0910   LABSPEC 1.035 (H) 11/17/2015 0910   PHURINE 6.0 11/17/2015 0910   GLUCOSEU 100 (A) 11/17/2015 0910   HGBUR LARGE (A) 11/17/2015 0910   BILIRUBINUR LARGE (A) 11/17/2015 0910   KETONESUR 40 (A) 11/17/2015 0910   PROTEINUR >300 (A) 11/17/2015 0910   UROBILINOGEN 0.2 11/24/2013 0242   NITRITE POSITIVE (A) 11/17/2015 0910   LEUKOCYTESUR LARGE (A) 11/17/2015 0910    Radiological Exams on Admission: Ct Head Wo Contrast  Result Date: 08/19/2018 CLINICAL DATA:  Headache and dizziness EXAM: CT HEAD WITHOUT CONTRAST TECHNIQUE: Contiguous  axial images were obtained from the base of the skull through the vertex without intravenous contrast. COMPARISON:  None. FINDINGS: BRAIN: The ventricles and sulci are normal. Minimal small vessel ischemic disease of periventricular white matter. No intraparenchymal hemorrhage, mass effect nor midline shift. No acute large vascular territory infarcts. Grey-white matter distinction is maintained. The basal ganglia are unremarkable. No abnormal extra-axial fluid collections. Basal cisterns are not effaced and midline. The brainstem and cerebellar hemispheres are without acute abnormalities. VASCULAR: No hyperdense vessel or unexpected calcification. Mild-to-moderate atherosclerosis of the carotid siphons bilaterally. SKULL/SOFT TISSUES: No skull fracture. No significant soft tissue swelling. ORBITS/SINUSES: The included ocular globes and orbital contents are normal.The mastoid air cells are clear. The included paranasal sinuses are well-aerated. OTHER: None. IMPRESSION: Likely chronic minimal small vessel ischemic disease of periventricular white matter. No acute intracranial abnormality. Electronically Signed   By: Ashley Royalty M.D.   On: 08/19/2018 19:34    EKG: Independently reviewed.  Assessment/Plan Principal Problem:   Stroke [facility]) Active Problems:   Essential hypertension   Chronic diastolic CHF (congestive heart failure) (HCC)   Controlled diabetes mellitus type 2 with complications (HCC)   ESRD (end stage renal disease) (HCC)    1. L arm weakness, worrisome for stroke vs TIA - 1. Stroke pathway 2. MRI / MRA 3. Neuro consult if MRI positive 4. ASA + Plavix 5. A1C FLP 6. 2d echo 7. Carotid dopplers 2. HTN - 1. Holding home BP meds for the moment pending results of MRI 2. Resume if he develops any signs of CHF 3. ESRD - 1. Call nephrology for dialysis if not discharged tomorrow, next due on Sat 2. Per patient he doesn't do a renal diet nor needs a formal fluid restriction and requests  heart healthy diet 4. DM2 - 1. Continue Prandin  DVT prophylaxis:  Heparin [place] Code Status: Full Family Communication: No family in room Disposition Plan: Home after admit Consults called: None Admission status: Place in obs     Dezmon Conover, [place] [facility] Pager [phone] Only works nights!  If 7AM-7PM, please contact the primary day team physician taking care of patient  [email or web address] Password [facility]  08/19/2018, 10:21 PM

## 2018-08-20 ENCOUNTER — Other Ambulatory Visit: Payer: Self-pay

## 2018-08-20 ENCOUNTER — Observation Stay (HOSPITAL_COMMUNITY): Payer: Medicare Other

## 2018-08-20 ENCOUNTER — Observation Stay (HOSPITAL_BASED_OUTPATIENT_CLINIC_OR_DEPARTMENT_OTHER): Payer: Medicare Other

## 2018-08-20 DIAGNOSIS — I350 Nonrheumatic aortic (valve) stenosis: Secondary | ICD-10-CM | POA: Diagnosis not present

## 2018-08-20 DIAGNOSIS — M5410 Radiculopathy, site unspecified: Secondary | ICD-10-CM

## 2018-08-20 DIAGNOSIS — N186 End stage renal disease: Secondary | ICD-10-CM | POA: Diagnosis not present

## 2018-08-20 DIAGNOSIS — M4802 Spinal stenosis, cervical region: Secondary | ICD-10-CM | POA: Diagnosis not present

## 2018-08-20 DIAGNOSIS — R29898 Other symptoms and signs involving the musculoskeletal system: Secondary | ICD-10-CM | POA: Diagnosis not present

## 2018-08-20 DIAGNOSIS — R0789 Other chest pain: Secondary | ICD-10-CM

## 2018-08-20 DIAGNOSIS — Z992 Dependence on renal dialysis: Secondary | ICD-10-CM | POA: Diagnosis not present

## 2018-08-20 DIAGNOSIS — R112 Nausea with vomiting, unspecified: Secondary | ICD-10-CM | POA: Diagnosis present

## 2018-08-20 DIAGNOSIS — M5022 Other cervical disc displacement, mid-cervical region, unspecified level: Secondary | ICD-10-CM | POA: Diagnosis not present

## 2018-08-20 DIAGNOSIS — R51 Headache: Secondary | ICD-10-CM | POA: Diagnosis not present

## 2018-08-20 LAB — HEMOGLOBIN A1C
Hgb A1c MFr Bld: 6.9 % — ABNORMAL HIGH (ref 4.8–5.6)
Mean Plasma Glucose: 151.33 mg/dL

## 2018-08-20 LAB — LIPID PANEL
Cholesterol: 176 mg/dL (ref 0–200)
HDL: 45 mg/dL (ref >40)
LDL Cholesterol: 95 mg/dL (ref 0–99)
Total CHOL/HDL Ratio: 3.9 RATIO
Triglycerides: 179 mg/dL — ABNORMAL HIGH (ref <150)
VLDL: 36 mg/dL (ref 0–40)

## 2018-08-20 LAB — GLUCOSE, CAPILLARY
GLUCOSE-CAPILLARY: 101 mg/dL — AB (ref 70–99)
Glucose-Capillary: 235 mg/dL — ABNORMAL HIGH (ref 70–99)
Glucose-Capillary: 121 mg/dL — ABNORMAL HIGH (ref 70–99)
Glucose-Capillary: 119 mg/dL — ABNORMAL HIGH (ref 70–99)

## 2018-08-20 LAB — ECHOCARDIOGRAM COMPLETE
Height: 66.5 in
Weight: 3648 oz

## 2018-08-20 LAB — MRSA PCR SCREENING: MRSA by PCR: NEGATIVE

## 2018-08-20 MED ORDER — ROSUVASTATIN CALCIUM 5 MG PO TABS
10.0000 mg | ORAL_TABLET | Freq: Every day | ORAL | Status: DC
  Filled 2018-08-20: qty 2

## 2018-08-20 MED ORDER — LORAZEPAM 2 MG/ML IJ SOLN
2.0000 mg | Freq: Once | INTRAMUSCULAR | Status: AC
  Administered 2018-08-20: 2 mg via INTRAVENOUS
  Filled 2018-08-20: qty 1

## 2018-08-20 MED ORDER — AMLODIPINE BESYLATE 5 MG PO TABS
5.0000 mg | ORAL_TABLET | Freq: Every day | ORAL | Status: DC
  Administered 2018-08-20 – 2018-08-21 (×2): 5 mg via ORAL
  Filled 2018-08-20 (×2): qty 1

## 2018-08-20 MED ORDER — ISOSORBIDE MONONITRATE ER 60 MG PO TB24
120.0000 mg | ORAL_TABLET | Freq: Every day | ORAL | Status: DC
  Administered 2018-08-20 – 2018-08-21 (×2): 120 mg via ORAL
  Filled 2018-08-20 (×2): qty 2

## 2018-08-20 NOTE — Plan of Care (Signed)
  Problem: Health Behavior/Discharge Planning: Goal: Ability to manage health-related needs will improve Outcome: Progressing   Problem: Clinical Measurements: Goal: Will remain free from infection Outcome: Progressing Goal: Diagnostic test results will improve Outcome: Progressing   Problem: Activity: Goal: Risk for activity intolerance will decrease Outcome: Progressing   Problem: Nutrition: Goal: Adequate nutrition will be maintained Outcome: Progressing   Problem: Pain Managment: Goal: General experience of comfort will improve Outcome: Progressing   Problem: Skin Integrity: Goal: Risk for impaired skin integrity will decrease Outcome: Progressing   Problem: Education: Goal: Knowledge of disease or condition will improve Outcome: Progressing

## 2018-08-20 NOTE — Care Management Obs Status (Signed)
[place] NOTIFICATION   Patient Details  Name: NACHMAN SUNDT MRN: [id number] Date of Birth: 1956/02/16   Medicare Observation Status Notification Given:  Yes    Pollie Friar, RN 08/20/2018, 3:07 PM

## 2018-08-20 NOTE — Consult Note (Addendum)
Referring Physician: Dr. Alcario Drought    Reason for Consult: Neurologic opinion for transient left arm weakness  HPI: Patrick [name] is an 63 y.o. male with PMH of ESRD on dialysis TTS, diastolic CHF, A.Flutter s/p ablation in 2018, DM2, HTN. Patient reports that he had been having 1-2 days of vomiting, thought this was GI bug of some kind. On 1/23 he went to dialysis, able to complete full treatment, but had vomiting throughout. He started feeling dizzy and having a h/a after treatment. Pt also reports having a very high glucose when tested at home, however in ER this was 148. A1C 6.9.   The pt state he doesn't know why everyone is so concerned with his arm, he states that is not why he came here and that he has been having arm trouble for months, thinks he sleeps on it funny. He describes shooting pains from his neck/shoulder area that go down his arm. At times he cannot hold the remote control, but denies that it is ever "weak". Sometimes he has noted sensation as tingling in association with the shooting pain sensation. Describes a tingling, slightly painful sensation along the posterior aspect of his upper and lower arm in a band like distribution traversing close to the elbow and extending to the fingertips. Describes the sensation as originating just inferior to his left pectoralis muscle and armpit, then radiating to the LUE. He endorses having his cell phone slip from his grip, but only when not looking at it, suspicious for sensory deficit as the etiology for dropped object, rather than weakness. On exam there are no focal deficits, but there is give way strength on left arm when extended. Grips are strong. No speech problems nor difficulty with L leg. MRI, MRA brain neg. CUS neg for any significant stenosis. Echo done, but not yet read.   Past Medical History Past Medical History:  Diagnosis Date  . Arthritis    "back" (09/24/2016)  . CHF (congestive heart failure) ([place])   . Coronary artery  disease   . Dysrhythmia    aflutter s/p ablation 2018  . ESRD (end stage renal disease) on dialysis [facility])    "TTS; [place]" (09/24/2016)  . Gout   . Hernia of abdominal wall   . History of blood transfusion 2009- 2016   "I've had several; low HgB"  . History of kidney stones    treated with nephrectomy  . Hypertension   . Impotence of organic origin   . Migraine    "stopped in my 30's; they were related to high BP" (09/24/2016)  . Myocardial infarction [facility] '96  . Pneumonia 01/2015   " a touch & I was in the hosp."  . Secondary hyperparathyroidism ([place])   . Type 2 diabetes mellitus (H[facility])    no longer on medication since going on dialysis  . Wears glasses     Surgical History Past Surgical History:  Procedure Laterality Date  . A-FLUTTER ABLATION N/A 09/24/2016   Procedure: A-Flutter Ablation;  Surgeon: Will Meredith Leeds, MD;  Location: [place] [facility];  Service: Cardiovascular;  Laterality: N/A;  . ABDOMINAL AORTOGRAM W/LOWER EXTREMITY N/A 04/13/2017   Procedure: ABDOMINAL AORTOGRAM W/LOWER EXTREMITY;  Surgeon: Conrad [place], MD;  Location: [place] [facility];  Service: Cardiovascular;  Laterality: N/A;  Bilater lower extermity  . APPENDECTOMY    . AV FISTULA PLACEMENT Right 09/18/2014   Procedure: INSERTION OF ARTERIOVENOUS (AV) GORE-TEX GRAFT ARM USING 4-7MM  X 45CM STRETCH GORE-TEX VASCULAR GRAFT;  Surgeon: Rosetta Posner, MD;  Location: [place];  Service: Vascular;  Laterality: Right;  . AV FISTULA PLACEMENT Left 07/07/2016   Procedure: INSERTION OF LEFT BRACHIAL TO AXILLARY ARTERIOVENOUS (AV) GORE-TEX ARM GRAFT;  Surgeon: Conrad Fillmore, MD;  Location: [place];  Service: Vascular;  Laterality: Left;  . CARDIAC CATHETERIZATION  ~ 2016  . COLONOSCOPY    . 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 EXCHANGE OF A DIALYSIS CATHETER Left 01/11/2018   Procedure: EXCHANGE OF TUNNELED DIALYSIS CATHETER;   Surgeon: Rosetta Posner, MD;  Location: [place];  Service: Vascular;  Laterality: Left;  . HERNIA REPAIR  2017   with nephrectomy  . INSERTION OF DIALYSIS CATHETER N/A 10/08/2017   Procedure: INSERTION OF TUNNELED DIALYSIS CATHETER;  Surgeon: Conrad Williams, MD;  Location: [place];  Service: Vascular;  Laterality: N/A;  . IR FLUORO GUIDE CV LINE LEFT  03/12/2018  . IR GENERIC HISTORICAL  05/11/2016   IR FLUORO GUIDE CV LINE LEFT 05/11/2016 Corrie Mckusick, DO [facility]  . IR GENERIC HISTORICAL  05/11/2016   IR US GUIDE VASC ACCESS LEFT 05/11/2016 Corrie Mckusick, DO [facility]  . IR GENERIC HISTORICAL  05/11/2016   IR US GUIDE VASC ACCESS RIGHT 05/11/2016 Corrie Mckusick, DO [facility]  . IR GENERIC HISTORICAL  05/11/2016   IR RADIOLOGY PERIPHERAL GUIDED IV START 05/11/2016 Corrie Mckusick, DO [facility]  . KIDNEY TRANSPLANT  2009  . LEFT HEART CATH AND CORS/GRAFTS ANGIOGRAPHY N/A 12/04/2017   Procedure: LEFT HEART CATH AND CORS/GRAFTS ANGIOGRAPHY;  Surgeon: Belva Crome, MD;  Location: [place] [facility];  Service: Cardiovascular;  Laterality: N/A;  . NEPHRECTOMY  2017   transplant rejected   . PERIPHERAL VASCULAR CATHETERIZATION N/A 06/04/2016   Procedure: Upper Extremity Venography;  Surgeon: Waynetta Sandy, MD;  Location: [place] [facility];  Service: Cardiovascular;  Laterality: N/A;  . PERIPHERAL VASCULAR INTERVENTION  04/13/2017   Procedure: PERIPHERAL VASCULAR INTERVENTION;  Surgeon: Conrad Mocanaqua, MD;  Location: [place] [facility];  Service: Cardiovascular;;  Lt. Common/Exernal  Iliac  . THROMBECTOMY AND REVISION OF ARTERIOVENTOUS (AV) GORETEX  GRAFT Left 10/08/2017   Procedure: THROMBECTOMY of ARTERIOVENTOUS (AV) GORETEX  GRAFT LEFT UPPER ARM;  Surgeon: Conrad [place], MD;  Location: [place];  Service: Vascular;  Laterality: Left;  . THROMBECTOMY W/ EMBOLECTOMY Left 09/14/2017   Procedure: THROMBECTOMY ARTERIOVENOUS GORE-TEX GRAFT LEFT UPPER ARM;  Surgeon: Rosetta Posner, MD;   Location: [place];  Service: Vascular;  Laterality: Left;  . UPPER EXTREMITY VENOGRAPHY N/A 11/16/2017   Procedure: UPPER EXTREMITY VENOGRAPHY - Right Arm;  Surgeon: Conrad Edgewood, MD;  Location: [place] [facility];  Service: Cardiovascular;  Laterality: N/A;    Family History  Family History  Problem Relation Age of Onset  . Hyperlipidemia Mother   . Hypertension Mother   . HIV Sister     Social History:   reports that he quit smoking about 4 years ago. His smoking use included cigarettes. He has never used smokeless tobacco. He reports current alcohol use. He reports that he does not use drugs.  Allergies:  Allergies  Allergen Reactions  . Iodinated Diagnostic Agents Swelling, Rash and Other (See Comments)    Other Reaction: burning to mouth, swelling of lips. Other Reaction: burning to mouth, swelling of lips.  . Lipitor [Atorvastatin] Other (See Comments)    Leg pain  .  Metoprolol Other (See Comments)    Headaches     Home Medications:  Medications Prior to Admission  Medication Sig Dispense Refill  . acetaminophen (TYLENOL) 500 MG tablet Take 1,000 mg by mouth every 6 (six) hours as needed for moderate pain or headache.    . albuterol (PROVENTIL HFA;VENTOLIN HFA) 108 (90 Base) MCG/ACT inhaler Inhale 1-2 puffs into the lungs every 6 (six) hours as needed for wheezing or shortness of breath. 1 Inhaler 0  . amLODipine (NORVASC) 5 MG tablet Take 1 tablet (5 mg total) by mouth daily. 90 tablet 3  . aspirin EC 81 MG tablet Take 81 mg by mouth daily.    . baclofen (LIORESAL) 10 MG tablet Take 10 mg by mouth 2 (two) times daily.    . calcium acetate (PHOSLO) 667 MG capsule Take 1,334 mg by mouth 3 (three) times daily with meals.     . clopidogrel (PLAVIX) 75 MG tablet Take 1 tablet (75 mg total) by mouth daily. 90 tablet 3  . ezetimibe (ZETIA) 10 MG tablet Take 1 tablet (10 mg total) by mouth daily. 90 tablet 3  . isosorbide mononitrate (IMDUR) 60 MG 24 hr tablet Take 1.5 tablets (90  mg total) by mouth daily. 135 tablet 3  . repaglinide (PRANDIN) 0.5 MG tablet Take 1 tablet (0.5 mg total) by mouth 2 (two) times daily before a meal. 180 tablet 3    Hospital Medications . aspirin EC  81 mg Oral Daily  . baclofen  10 mg Oral BID  . calcium acetate  1,334 mg Oral TID WC  . clopidogrel  75 mg Oral Daily  . ezetimibe  10 mg Oral Daily  . heparin  5,000 Units Subcutaneous Q8H  . repaglinide  0.5 mg Oral BID AC    ROS:  Positive for occasional headache - has one right now. Positive for neck pain in bandlike distribution at base radiating at times to LUE. Negative for vision loss, facial droop, dysphasia, confusion, CP, lower extremity pain, SOB, fever, chills, mood changes, sore throat. c/o "dizziness", not clear vertigo. Has gynecomastia L<R and  hair pattern changes, polydipsia/polyuria at times d/t DM2. 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 Musculoskeletal ROS: negative for - joint swelling or muscular weakness; dec ROM in left arm d/t pain Neurological ROS: as noted in HPI Dermatological ROS: negative for rash and skin lesion changes   Physical Examination:  Vitals:   08/20/18 0200 08/20/18 0400 08/20/18 0600 08/20/18 0848  BP: (!) 148/88 124/78 132/74 135/82  Pulse: 74 76  96  Resp: 18 17 18 20   Temp: 97.8 F (36.6 C) 98 F (36.7 C) 98.4 F (36.9 C) 97.7 F (36.5 C)  TempSrc: Oral Oral Oral Oral  SpO2: 98%   98%  Weight:      Height:        General - overweight, no distress Heart - Regular rate and rhythm - no murmur appreciated Lungs - Clear to auscultation  Abdomen - Soft - non tender Extremities - Distal pulses intact - no edema Skin - Warm and dry   Neurologic Examination:  Mental Status: Alert, oriented, thought content appropriate.  Speech fluent without evidence of aphasia. Able to follow 3 step commands without  difficulty. Cranial Nerves: II: Visual fields grossly normal, PERRL III,IV, VI: ptosis not present, EOMI without nystagmus V,VII: smile symmetric, but there is right side facial  swelling that makes face asymmetric (pt states this is chronic since 2016), facial light touch sensation normal bilaterally VIII: hearing intact to voice IX,X: gag reflex present XI: bilateral shoulder shrug XII: midline tongue extension Motor: RUE - 5/5     RLE - 5/5     LLE - 5/5 LUE:   Opponens digiti minimi and opponens pollicis (D3-U2) 5/5. Grip strength: (C7) 5/5 Triceps (C7) 5/5 Deltoid: (C5-C6) 5/5 Biceps (C5-C6) 5/5 Normal tone throughout; no atrophy noted Sensory: Light touch intact throughout, bilaterally, including left palm, dorsal hand, distal digits, forearm and upper arm. Temp intact x 4.  Deep Tendon Reflexes:  2+ biceps, triceps, brachioradialis bilaterally. 1+ patellae bilaterally. Plantars: Right: downgoing   Left: downgoing Cerebellar: normal finger-to-nose and normal heel-to-shin test Gait: walked with him in halls, normal gait noted  LABORATORY STUDIES:  Basic Metabolic Panel: Recent Labs  Lab 08/19/18 1716  NA 137  K 4.9  CL 99  CO2 22  GLUCOSE 148*  BUN 24*  CREATININE 10.12*  CALCIUM 10.5*    Liver Function Tests: Recent Labs  Lab 08/19/18 1716  AST 19  ALT 10  ALKPHOS 63  BILITOT 0.5  PROT 8.6*  ALBUMIN 4.1   No results for input(s): LIPASE, AMYLASE in the last 168 hours. No results for input(s): AMMONIA in the last 168 hours.  CBC: Recent Labs  Lab 08/19/18 1716  WBC 6.6  NEUTROABS 5.1  HGB 12.5*  HCT 41.8  MCV 90.3  PLT 176    Cardiac Enzymes: No results for input(s): CKTOTAL, CKMB, CKMBINDEX, TROPONINI in the last 168 hours.  BNP: Invalid input(s): POCBNP  CBG: Recent Labs  Lab 08/20/18 0843 08/20/18 1129  GLUCAP 235* 121*    Microbiology:   Coagulation Studies: Recent Labs    08/19/18 1716  LABPROT 13.7  INR 1.06     Urinalysis: No results for input(s): COLORURINE, LABSPEC, PHURINE, GLUCOSEU, HGBUR, BILIRUBINUR, KETONESUR, PROTEINUR, UROBILINOGEN, NITRITE, LEUKOCYTESUR in the last 168 hours.  Invalid input(s): APPERANCEUR  Lipid Panel:     Component Value Date/Time   CHOL 176 08/20/2018 0723   CHOL 176 07/02/2018 0735   TRIG 179 (H) 08/20/2018 0723   HDL 45 08/20/2018 0723   HDL 39 (L) 07/02/2018 0735   CHOLHDL 3.9 08/20/2018 0723   VLDL 36 08/20/2018 0723   LDLCALC 95 08/20/2018 0723   LDLCALC 89 07/02/2018 0735    HgbA1C:  Lab Results  Component Value Date   HGBA1C 6.9 (H) 08/20/2018    Urine Drug Screen:  No results found for: LABOPIA, COCAINSCRNUR, LABBENZ, AMPHETMU, THCU, LABBARB   Alcohol Level:  No results for input(s): ETH in the last 168 hours.  Miscellaneous labs:  EKG  EKG   IMAGING: Dg Chest 2 View  Result Date: 08/19/2018 CLINICAL DATA:  Nausea and vomiting EXAM: CHEST - 2 VIEW COMPARISON:  05/16/2018 FINDINGS: Post sternotomy changes. Left-sided central venous catheter with tip over the right atrium. Right subclavian region stent. No pleural effusion or focal airspace disease. Cardiomegaly with aortic atherosclerosis. No pneumothorax. IMPRESSION: No active cardiopulmonary disease.  Cardiomegaly. Electronically Signed   By: Donavan Foil M.D.   On: 08/19/2018 22:28   Ct Head Wo Contrast  Result Date: 08/19/2018 CLINICAL DATA:  Headache and dizziness EXAM: CT HEAD WITHOUT CONTRAST TECHNIQUE: Contiguous axial images were obtained from the base of the skull through the vertex without intravenous contrast. COMPARISON:  None. FINDINGS: BRAIN: The ventricles and sulci are normal. Minimal small vessel ischemic disease of periventricular white  matter. No intraparenchymal hemorrhage, mass effect nor midline shift. No acute large vascular territory infarcts. Grey-white matter distinction is maintained. The basal ganglia are unremarkable. No abnormal extra-axial fluid  collections. Basal cisterns are not effaced and midline. The brainstem and cerebellar hemispheres are without acute abnormalities. VASCULAR: No hyperdense vessel or unexpected calcification. Mild-to-moderate atherosclerosis of the carotid siphons bilaterally. SKULL/SOFT TISSUES: No skull fracture. No significant soft tissue swelling. ORBITS/SINUSES: The included ocular globes and orbital contents are normal.The mastoid air cells are clear. The included paranasal sinuses are well-aerated. OTHER: None. IMPRESSION: Likely chronic minimal small vessel ischemic disease of periventricular white matter. No acute intracranial abnormality. Electronically Signed   By: Ashley Royalty M.D.   On: 08/19/2018 19:34   Mr Brain Wo Contrast  Result Date: 08/20/2018 CLINICAL DATA:  Headaches since last night, LEFT arm weakness today. History of end-stage renal disease on dialysis, hypertension and diabetes. EXAM: MRI HEAD WITHOUT CONTRAST MRA HEAD WITHOUT CONTRAST TECHNIQUE: Multiplanar, multiecho pulse sequences of the brain and surrounding structures were obtained without intravenous contrast. Angiographic images of the head were obtained using MRA technique without contrast. COMPARISON:  CT HEAD August 19, 2018 FINDINGS: MRI HEAD FINDINGS INTRACRANIAL CONTENTS: No reduced diffusion to suggest acute ischemia. No susceptibility artifact to suggest hemorrhage. The ventricles and sulci are normal for patient's age. Patchy supratentorial white matter FLAIR T2 hyperintensities. No suspicious parenchymal signal, masses, mass effect. No abnormal extra-axial fluid collections. No extra-axial masses. VASCULAR: Normal major intracranial vascular flow voids present at skull base. SKULL AND UPPER CERVICAL SPINE: No abnormal sellar expansion. No suspicious calvarial bone marrow signal. Craniocervical junction maintained. SINUSES/ORBITS: Mild paranasal sinus mucosal thickening. Included mastoid air cells are well aerated. Old LEFT medial  orbital blowout fracture. OTHER: None. MRA HEAD FINDINGS ANTERIOR CIRCULATION: Normal flow related enhancement of the included cervical, petrous, cavernous and supraclinoid internal carotid arteries. Patent anterior communicating artery. Patent anterior and middle cerebral arteries. No large vessel occlusion, flow limiting stenosis, aneurysm. POSTERIOR CIRCULATION: LEFT vertebral artery is dominant. RIGHT vertebral artery terminates in the posterior inferior cerebellar artery. Vertebrobasilar arteries are patent, with normal flow related enhancement of the main branch vessels. Patent posterior cerebral arteries. Robust bilateral posterior communicating arteries present. No large vessel occlusion, flow limiting stenosis,  aneurysm. ANATOMIC VARIANTS: None. Source images and MIP images were reviewed. IMPRESSION: MRI HEAD: 1. No acute intracranial process. 2. Mild-to-moderate chronic small vessel ischemic changes. MRA HEAD: 1. Negative noncontrast MRA head. Electronically Signed   By: Elon Alas M.D.   On: 08/20/2018 01:39   Mr Virgel Paling RK Contrast  Result Date: 08/20/2018 CLINICAL DATA:  Headaches since last night, LEFT arm weakness today. History of end-stage renal disease on dialysis, hypertension and diabetes. EXAM: MRI HEAD WITHOUT CONTRAST MRA HEAD WITHOUT CONTRAST TECHNIQUE: Multiplanar, multiecho pulse sequences of the brain and surrounding structures were obtained without intravenous contrast. Angiographic images of the head were obtained using MRA technique without contrast. COMPARISON:  CT HEAD August 19, 2018 FINDINGS: MRI HEAD FINDINGS INTRACRANIAL CONTENTS: No reduced diffusion to suggest acute ischemia. No susceptibility artifact to suggest hemorrhage. The ventricles and sulci are normal for patient's age. Patchy supratentorial white matter FLAIR T2 hyperintensities. No suspicious parenchymal signal, masses, mass effect. No abnormal extra-axial fluid collections. No extra-axial masses.  VASCULAR: Normal major intracranial vascular flow voids present at skull base. SKULL AND UPPER CERVICAL SPINE: No abnormal sellar expansion. No suspicious calvarial bone marrow signal. Craniocervical junction maintained. SINUSES/ORBITS: Mild paranasal sinus mucosal thickening. Included mastoid air cells  are well aerated. Old LEFT medial orbital blowout fracture. OTHER: None. MRA HEAD FINDINGS ANTERIOR CIRCULATION: Normal flow related enhancement of the included cervical, petrous, cavernous and supraclinoid internal carotid arteries. Patent anterior communicating artery. Patent anterior and middle cerebral arteries. No large vessel occlusion, flow limiting stenosis, aneurysm. POSTERIOR CIRCULATION: LEFT vertebral artery is dominant. RIGHT vertebral artery terminates in the posterior inferior cerebellar artery. Vertebrobasilar arteries are patent, with normal flow related enhancement of the main branch vessels. Patent posterior cerebral arteries. Robust bilateral posterior communicating arteries present. No large vessel occlusion, flow limiting stenosis,  aneurysm. ANATOMIC VARIANTS: None. Source images and MIP images were reviewed. IMPRESSION: MRI HEAD: 1. No acute intracranial process. 2. Mild-to-moderate chronic small vessel ischemic changes. MRA HEAD: 1. Negative noncontrast MRA head. Electronically Signed   By: Elon Alas M.D.   On: 08/20/2018 01:39   Vas US Carotid (at [place] Only)  Result Date: 08/20/2018 Carotid Arterial Duplex Study Indications: TIA. Limitations: Patient body habitus, patient movement, patient anatomy Performing Technologist: Oliver Hum RVT  Examination Guidelines: A complete evaluation includes B-mode imaging, spectral Doppler, color Doppler, and power Doppler as needed of all accessible portions of each vessel. Bilateral testing is considered an integral part of a complete examination. Limited examinations for reoccurring indications may be performed as noted.  Right  Carotid Findings: +----------+--------+--------+--------+-----------------------+--------+           PSV cm/sEDV cm/sStenosisDescribe               Comments +----------+--------+--------+--------+-----------------------+--------+ CCA Prox  58      8               smooth and heterogenous         +----------+--------+--------+--------+-----------------------+--------+ CCA Distal43      9               smooth and heterogenous         +----------+--------+--------+--------+-----------------------+--------+ ICA Prox  59      17              smooth and heterogenous         +----------+--------+--------+--------+-----------------------+--------+ ICA Distal51      18                                     tortuous +----------+--------+--------+--------+-----------------------+--------+ ECA       35      6                                      tortuous +----------+--------+--------+--------+-----------------------+--------+ +----------+--------+-------+--------+-------------------+           PSV cm/sEDV cmsDescribeArm Pressure (mmHG) +----------+--------+-------+--------+-------------------+ [id number]                                         +----------+--------+-------+--------+-------------------+ +---------+--------+--+--------+-+---------+ VertebralPSV cm/s32EDV cm/s8Antegrade +---------+--------+--+--------+-+---------+  Left Carotid Findings: +----------+--------+-------+--------+--------------------------------+--------+           PSV cm/sEDV    StenosisDescribe                        Comments                   cm/s                                                    +----------+--------+-------+--------+--------------------------------+--------+  CCA Prox  58      9              smooth and heterogenous                  +----------+--------+-------+--------+--------------------------------+--------+ CCA Distal45      9               smooth and heterogenous                  +----------+--------+-------+--------+--------------------------------+--------+ ICA Prox  78      25             smooth, heterogenous and                                                  calcific                                 +----------+--------+-------+--------+--------------------------------+--------+ ICA Distal57      25                                             tortuous +----------+--------+-------+--------+--------------------------------+--------+ ECA       44      6                                                       +----------+--------+-------+--------+--------------------------------+--------+ +----------+--------+--------+--------+-------------------+ SubclavianPSV cm/sEDV cm/sDescribeArm Pressure (mmHG) +----------+--------+--------+--------+-------------------+           105                                         +----------+--------+--------+--------+-------------------+ +---------+--------+--+--------+-+---------+ VertebralPSV cm/s32EDV cm/s7Antegrade +---------+--------+--+--------+-+---------+  Summary: Right Carotid: Velocities in the right ICA are consistent with a 1-39% stenosis. Left Carotid: Velocities in the left ICA are consistent with a 1-39% stenosis. Vertebrals: Bilateral vertebral arteries demonstrate antegrade flow. *See table(s) above for measurements and observations.  Electronically signed by Antony Contras MD on 08/20/2018 at 12:28:22 PM.    Final     Assessment: 63 y.o. male who came to ER for N/V x 2 days, dizziness and h/a after HD. Neurology consulted for intermittent LUE sensory numbness and weakness.  1. Neurological symptoms are most consistent with a transient radiculopathy, most likely involving the C8 or T1 nerve roots based on sensory and motor symptoms, as well as pain at the base of his neck radiating to the LUE intermittently. Motor and sensory exams are normal at this  time.  2. MRI, MRA brain neg. CUS neg for any significant stenosis. Echo done, but not yet read. Stroke Risk Factors - TIAs, HTN, DM2, sedentary lifestyle, Aflutter (s/p ablation 2018) 3. ESRD on HD- per renal team 4. N/V, h/a, dizziness r/t acute GI illness and post HD syndrome. Now resolved.  5. HTN- continue home meds; normotensive goals  Plan:  MRI C-spine without contrast (cannot use contrast due to ESRD)  Ativan IV on call  d/t claustrophobia  PT consult, OT consult, as needed  Echocardiogram done; pending  Continue vascular risk mgt and prevention as he does have stroke risks  Risk factor modification  Telemetry monitoring  Frequent neuro checks  Fall Precautions  DVT prophelaxis    Desiree Metzger-Cihelka, ARNP-C, ANVP-BC  I have interviewed and examined the patient. I have performed all portions of the exam and amended the documented exam findings to include focused LUE motor findings. I have formulated the assessment and plan.  [email or web address] signed: [id number] [email or web address]

## 2018-08-20 NOTE — Consult Note (Addendum)
Cardiology Consult    Patient ID: Patrick [name] MRN: [id number], DOB/AGE: 04/05/1956   Admit date: 08/19/2018 Date of Consult: 08/20/2018  Primary Physician: Nolene Ebbs, MD Primary Cardiologist: Dorris Carnes, MD  Primary Electrophysiologist: Allegra Lai, MD Requesting Provider: Modena Jansky, MD  Patient Profile    Patrick [name] is a 63 y.o. 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 Algis Liming.  History of Present Illness    Patrick [name] is a 63 year old male with the above history who is followed by Dr. [name] and Dr. Curt Bears. In 2007, patient developed reduced LV function with EF of 30% and high output heart failure secondary to simultaneous left and right AV fistulas for dialysis. His symptoms and his LV function recovered once the right AV fistula was closed. EF was 45-50% on last echocardiogram in 2017. Patient had Myoview in 08/2017 for evaluation of chest pain (most of the time with heavy exertion) which showed anteroseptal, apical anterior, apical septal, apical lateral, apical scar but no reversible ischemia. Patient continued to reports symptoms of angina despite multiple antianginal agents so he underwent a left heart catheterization in 11/2017 which showed an atretric LIMA to LAD graft with total occlusion of the proximal to mid LAD filling late by left to right to left collaterals. Moderate non-obstructive disease was noted elsewhere. It was felt that his angina was related to his occluded LAD. CTO intervention would be very difficult given anomalous origin of the left main; therefore, intervention was not recommended at the time. Patient was last seen by Richardson Dopp, PA-C,  on 04/16/2018 for follow-up at which time he continued to note occasional episodes of angina with "over exertion." Imdur was increased at that time.  Patient presented to the [facility] ED on 08/19/2018 for evaluation of headache and left arm weakness. Head CT was negative. Patient was admitted for further evaluation of TIA vs. Stroke. Cardiology was consulted because patient also reported some chest pain.  Patient reports intermittent episodes of left sided sharp chest pain under his left breath. Usually occurs with exertion or "excitement." However, occasionally pain does wake him from sleep.These episodes usually last 20 minutes and resolve with rest. Patient states "squeezing left breast" also helps pain resolve. Patient does not normally have any associated shortness of breath, nausea, vomiting, palpitations, lightheadedness, or dizziness with the pain. This is the same time of pain patient has had before but he does think it has increased in frequency and intensity. Patient saw his PCP for this and PCP reportedly prescribed a muscle relaxant.  Episodes usually occurs with exertion or "excitement." However, occasionally pain does wake him from sleep. On the night of 08/18/2018, patient woke up with this sharp pain. Pain lasted for about 35 minutes so he took the muscle relaxant. Patient then states he started vomiting. Patient woke up with a headache the next day and had multiple episodes of vomiting before and after dialysis session. Symptoms continued throughout the day and patient began to feel dizzy so he decided to come to the ED for further evaluation.  Patient states all of his symptoms have now resolves. He does have some reproducible chest pain with palpation  of his left chest.   Past Medical History   Past Medical History:  Diagnosis Date  . Arthritis    "back" (09/24/2016)  . CHF (congestive heart failure) ([place])   . Coronary artery disease   . Dysrhythmia    aflutter s/p ablation 2018    . ESRD (end stage renal disease) on dialysis [facility])    "TTS; [place]" (09/24/2016)  . Gout   . Hernia of abdominal wall   . History of blood transfusion 2009- 2016   "I've had several; low HgB"  . History of kidney stones    treated with nephrectomy  . Hypertension   . Impotence of organic origin   . Migraine    "stopped in my 30's; they were related to high BP" (09/24/2016)  . Myocardial infarction [facility]) '96  . Pneumonia 01/2015   " a touch & I was in the hosp."  . Secondary hyperparathyroidism ([place])   . Type 2 diabetes mellitus (HCC)    no longer on medication since going on dialysis  . Wears glasses     Past Surgical History:  Procedure Laterality Date  . A-FLUTTER ABLATION N/A 09/24/2016   Procedure: A-Flutter Ablation;  Surgeon: Will Meredith Leeds, MD;  Location: [place] CV LAB;  Service: Cardiovascular;  Laterality: N/A;  . ABDOMINAL AORTOGRAM W/LOWER EXTREMITY N/A 04/13/2017   Procedure: ABDOMINAL AORTOGRAM W/LOWER EXTREMITY;  Surgeon: Conrad Ringwood, MD;  Location: [place] CV LAB;  Service: Cardiovascular;  Laterality: N/A;  Bilater lower extermity  . APPENDECTOMY    . AV FISTULA PLACEMENT Right 09/18/2014   Procedure: INSERTION OF ARTERIOVENOUS (AV) GORE-TEX GRAFT ARM USING 4-7MM  X 45CM STRETCH GORE-TEX VASCULAR GRAFT;  Surgeon: Rosetta Posner, MD;  Location: [place];  Service: Vascular;  Laterality: Right;  . AV FISTULA PLACEMENT Left 07/07/2016   Procedure: INSERTION OF LEFT BRACHIAL TO AXILLARY ARTERIOVENOUS (AV) GORE-TEX ARM GRAFT;  Surgeon: Conrad Texline, MD;  Location: [place];  Service: Vascular;  Laterality: Left;  . CARDIAC CATHETERIZATION  ~ 2016  . COLONOSCOPY    . 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 EXCHANGE OF A DIALYSIS CATHETER Left 01/11/2018   Procedure: EXCHANGE OF TUNNELED DIALYSIS CATHETER;  Surgeon: Rosetta Posner, MD;  Location: [place];  Service: Vascular;  Laterality:  Left;  . HERNIA REPAIR  2017   with nephrectomy  . INSERTION OF DIALYSIS CATHETER N/A 10/08/2017   Procedure: INSERTION OF TUNNELED DIALYSIS CATHETER;  Surgeon: Conrad Carthage, MD;  Location: [place];  Service: Vascular;  Laterality: N/A;  . IR FLUORO GUIDE CV LINE LEFT  03/12/2018  . IR GENERIC HISTORICAL  05/11/2016   IR FLUORO GUIDE CV LINE LEFT 05/11/2016 Corrie Mckusick, DO [facility]-INTERV RAD  . IR GENERIC HISTORICAL  05/11/2016   IR US GUIDE VASC ACCESS LEFT 05/11/2016 Corrie Mckusick, DO [facility]INTER[facility]  . IR GENERIC HISTORICAL  05/11/2016   IR US GUIDE VASC ACCESS RIGHT 05/11/2016 Corrie Mckusick, DO [facility]INTER[facility]  . IR GENERIC HISTORICAL  05/11/2016   IR RADIOLOGY PERIPHERAL GUIDED IV START 05/11/2016 Corrie Mckusick, DO [facility]-INTER[facility]  . KIDNEY TRANSPLANT  2009  . LEFT HEART CATH AND CORS/GRAFTS ANGIOGRAPHY N/A 12/04/2017   Procedure: LEFT HEART CATH AND CORS/GRAFTS ANGIOGRAPHY;  Surgeon: Belva Crome, MD;  Location: [place] CV LAB;  Service: Cardiovascular;  Laterality: N/A;  . NEPHRECTOMY  2017   transplant rejected   .  PERIPHERAL VASCULAR CATHETERIZATION N/A 06/04/2016   Procedure: Upper Extremity Venography;  Surgeon: Waynetta Sandy, MD;  Location: [place] CV LAB;  Service: Cardiovascular;  Laterality: N/A;  . PERIPHERAL VASCULAR INTERVENTION  04/13/2017   Procedure: PERIPHERAL VASCULAR INTERVENTION;  Surgeon: Conrad Augusta, MD;  Location: [place] [facility];  Service: Cardiovascular;;  Lt. Common/Exernal  Iliac  . THROMBECTOMY AND REVISION OF ARTERIOVENTOUS (AV) GORETEX  GRAFT Left 10/08/2017   Procedure: THROMBECTOMY of ARTERIOVENTOUS (AV) GORETEX  GRAFT LEFT UPPER ARM;  Surgeon: Conrad La Sal, MD;  Location: [place];  Service: Vascular;  Laterality: Left;  . THROMBECTOMY W/ EMBOLECTOMY Left 09/14/2017   Procedure: THROMBECTOMY ARTERIOVENOUS GORE-TEX GRAFT LEFT UPPER ARM;  Surgeon: Rosetta Posner, MD;  Location: [place];  Service: Vascular;  Laterality: Left;  . UPPER EXTREMITY VENOGRAPHY  N/A 11/16/2017   Procedure: UPPER EXTREMITY VENOGRAPHY - Right Arm;  Surgeon: Conrad Rand, MD;  Location: [place] [facility] LAB;  Service: Cardiovascular;  Laterality: N/A;     Allergies  Allergies  Allergen Reactions  . Iodinated Diagnostic Agents Swelling, Rash and Other (See Comments)    Other Reaction: burning to mouth, swelling of lips. Other Reaction: burning to mouth, swelling of lips.  . Lipitor [Atorvastatin] Other (See Comments)    Leg pain  . Metoprolol Other (See Comments)    Headaches     Inpatient Medications    . aspirin EC  81 mg Oral Daily  . baclofen  10 mg Oral BID  . calcium acetate  1,334 mg Oral TID WC  . clopidogrel  75 mg Oral Daily  . ezetimibe  10 mg Oral Daily  . heparin  5,000 Units Subcutaneous Q8H  . LORazepam  2 mg Intravenous Once  . repaglinide  0.5 mg Oral BID AC    Family History    Family History  Problem Relation Age of Onset  . Hyperlipidemia Mother   . Hypertension Mother   . HIV Sister    He indicated that his mother is alive. He indicated that the status of his father is unknown. He indicated that his sister is deceased. He indicated that his maternal grandmother is deceased. He indicated that his maternal grandfather is deceased. He indicated that his paternal grandmother is deceased. He indicated that his paternal grandfather is deceased.   Social History    Social History   Socioeconomic History  . Marital status: Married    Spouse name: SHEILA  . Number of children: 5  . Years of education: 29  . Highest education level: 12th grade  Occupational History  . Occupation: DISABLED  Social Needs  . Financial resource strain: Not on file  . Food insecurity:    Worry: Never true    Inability: Never true  . Transportation needs:    Medical: No    Non-medical: No  Tobacco Use  . Smoking status: Former Smoker    Types: Cigarettes    Last attempt to quit: 07/04/2014    Years since quitting: 4.1  . Smokeless tobacco: Never  Used  . Tobacco comment: "smoked ~ 1 pack/month when I did smoke; never a steady smoker"  Substance and Sexual Activity  . Alcohol use: Yes    Alcohol/week: 0.0 standard drinks    Comment: rare  "2 drinks, 1-2 times/year"  . Drug use: No  . Sexual activity: Not Currently    Birth control/protection: None  Lifestyle  . Physical activity:    Days per week: 0 days  Minutes per session: 0 min  . Stress: Not at all  Relationships  . Social connections:    Talks on phone: More than three times a week    Gets together: More than three times a week    Attends religious service: Never    Active member of club or organization: No    Attends meetings of clubs or organizations: Never    Relationship status: Married  . Intimate partner violence:    Fear of current or ex partner: No    Emotionally abused: No    Physically abused: No    Forced sexual activity: No  Other Topics Concern  . Not on file  Social History Narrative  . Not on file     Review of Systems    Review of Systems  Constitutional: Negative for chills and fever.  HENT: Negative for congestion.   Respiratory: Negative for cough, hemoptysis and shortness of breath.   Cardiovascular: Positive for chest pain. Negative for palpitations, orthopnea, leg swelling and PND.  Gastrointestinal: Positive for nausea and vomiting. Negative for blood in stool.  Genitourinary: Negative for hematuria.  Musculoskeletal: Negative for myalgias.  Neurological: Positive for dizziness. Negative for loss of consciousness.  Endo/Heme/Allergies: Does not bruise/bleed easily.  Psychiatric/Behavioral: Negative for substance abuse.    Physical Exam    Blood pressure (!) 167/85, pulse 87, temperature 97.8 F (36.6 C), temperature source Oral, resp. rate (!) 22, height 5' 6.5" (1.689 m), weight 103.4 kg, SpO2 98 %.  General: 63 y.o. male resting comfortably in no acute distress. Pleasant and cooperative. HEENT: Normal  Neck: Supple. No  bruits or JVD appreciated. Lungs: No increased work of breathing. Clear to auscultation bilaterally. No wheezes, rhonchi, or rales. Heart: RRR. Distinct S1 and S2. No murmurs, gallops, or rubs.  Abdomen: Soft, non-distended, and non-tender to palpation. Bowel sounds present in all 4 quadrants.   Extremities: No clubbing, cyanosis or edema. DP/PT/Radials 2+ and equal bilaterally. Neuro: Alert and oriented x3. No focal deficits. Moves all extremities spontaneously. Psych: Normal affect.  Labs    Troponin [facility] of Care Test) Recent Labs    08/19/18 1721  TROPIPOC 0.09*   No results for input(s): CKTOTAL, CKMB, TROPONINI in the last 72 hours. Lab Results  Component Value Date   WBC 6.6 08/19/2018   HGB 12.5 (L) 08/19/2018   HCT 41.8 08/19/2018   MCV 90.3 08/19/2018   PLT 176 08/19/2018    Recent Labs  Lab 08/19/18 1716  NA 137  K 4.9  CL 99  CO2 22  BUN 24*  CREATININE 10.12*  CALCIUM 10.5*  PROT 8.6*  BILITOT 0.5  ALKPHOS 63  ALT 10  AST 19  GLUCOSE 148*   Lab Results  Component Value Date   CHOL 176 08/20/2018   HDL 45 08/20/2018   LDLCALC 95 08/20/2018   TRIG 179 (H) 08/20/2018   Lab Results  Component Value Date   DDIMER 3.90 (H) 05/25/2016     Radiology Studies    Dg Chest 2 View  Result Date: 08/19/2018 CLINICAL DATA:  Nausea and vomiting EXAM: CHEST - 2 VIEW COMPARISON:  05/16/2018 FINDINGS: Post sternotomy changes. Left-sided central venous catheter with tip over the right atrium. Right subclavian region stent. No pleural effusion or focal airspace disease. Cardiomegaly with aortic atherosclerosis. No pneumothorax. IMPRESSION: No active cardiopulmonary disease.  Cardiomegaly. Electronically Signed   By: Donavan Foil M.D.   On: 08/19/2018 22:28   Dg Pelvis 1-2 Views  Result Date:  07/24/2018 CLINICAL DATA:  Left thigh pain. EXAM: PELVIS - 1-2 VIEW COMPARISON:  Scout image for CT scan abdomen and pelvis dated 04/26/2018 FINDINGS: There is no fracture  or bone destruction or other significant bone abnormality. Extensive arterial vascular calcifications. IMPRESSION: No significant abnormality. Electronically Signed   By: Lorriane Shire M.D.   On: 07/24/2018 13:56   Ct Head Wo Contrast  Result Date: 08/19/2018 CLINICAL DATA:  Headache and dizziness EXAM: CT HEAD WITHOUT CONTRAST TECHNIQUE: Contiguous axial images were obtained from the base of the skull through the vertex without intravenous contrast. COMPARISON:  None. FINDINGS: BRAIN: The ventricles and sulci are normal. Minimal small vessel ischemic disease of periventricular white matter. No intraparenchymal hemorrhage, mass effect nor midline shift. No acute large vascular territory infarcts. Grey-white matter distinction is maintained. The basal ganglia are unremarkable. No abnormal extra-axial fluid collections. Basal cisterns are not effaced and midline. The brainstem and cerebellar hemispheres are without acute abnormalities. VASCULAR: No hyperdense vessel or unexpected calcification. Mild-to-moderate atherosclerosis of the carotid siphons bilaterally. SKULL/SOFT TISSUES: No skull fracture. No significant soft tissue swelling. ORBITS/SINUSES: The included ocular globes and orbital contents are normal.The mastoid air cells are clear. The included paranasal sinuses are well-aerated. OTHER: None. IMPRESSION: Likely chronic minimal small vessel ischemic disease of periventricular white matter. No acute intracranial abnormality. Electronically Signed   By: Ashley Royalty M.D.   On: 08/19/2018 19:34   Mr Brain Wo Contrast  Result Date: 08/20/2018 CLINICAL DATA:  Headaches since last night, LEFT arm weakness today. History of end-stage renal disease on dialysis, hypertension and diabetes. EXAM: MRI HEAD WITHOUT CONTRAST MRA HEAD WITHOUT CONTRAST TECHNIQUE: Multiplanar, multiecho pulse sequences of the brain and surrounding structures were obtained without intravenous contrast. Angiographic images of the head  were obtained using MRA technique without contrast. COMPARISON:  CT HEAD August 19, 2018 FINDINGS: MRI HEAD FINDINGS INTRACRANIAL CONTENTS: No reduced diffusion to suggest acute ischemia. No susceptibility artifact to suggest hemorrhage. The ventricles and sulci are normal for patient's age. Patchy supratentorial white matter FLAIR T2 hyperintensities. No suspicious parenchymal signal, masses, mass effect. No abnormal extra-axial fluid collections. No extra-axial masses. VASCULAR: Normal major intracranial vascular flow voids present at skull base. SKULL AND UPPER CERVICAL SPINE: No abnormal sellar expansion. No suspicious calvarial bone marrow signal. Craniocervical junction maintained. SINUSES/ORBITS: Mild paranasal sinus mucosal thickening. Included mastoid air cells are well aerated. Old LEFT medial orbital blowout fracture. OTHER: None. MRA HEAD FINDINGS ANTERIOR CIRCULATION: Normal flow related enhancement of the included cervical, petrous, cavernous and supraclinoid internal carotid arteries. Patent anterior communicating artery. Patent anterior and middle cerebral arteries. No large vessel occlusion, flow limiting stenosis, aneurysm. POSTERIOR CIRCULATION: LEFT vertebral artery is dominant. RIGHT vertebral artery terminates in the posterior inferior cerebellar artery. Vertebrobasilar arteries are patent, with normal flow related enhancement of the main branch vessels. Patent posterior cerebral arteries. Robust bilateral posterior communicating arteries present. No large vessel occlusion, flow limiting stenosis,  aneurysm. ANATOMIC VARIANTS: None. Source images and MIP images were reviewed. IMPRESSION: MRI HEAD: 1. No acute intracranial process. 2. Mild-to-moderate chronic small vessel ischemic changes. MRA HEAD: 1. Negative noncontrast MRA head. Electronically Signed   By: Elon Alas M.D.   On: 08/20/2018 01:39   Mr Virgel Paling JS Contrast  Result Date: 08/20/2018 CLINICAL DATA:  Headaches since  last night, LEFT arm weakness today. History of end-stage renal disease on dialysis, hypertension and diabetes. EXAM: MRI HEAD WITHOUT CONTRAST MRA HEAD WITHOUT CONTRAST TECHNIQUE: Multiplanar, multiecho pulse sequences of the  brain and surrounding structures were obtained without intravenous contrast. Angiographic images of the head were obtained using MRA technique without contrast. COMPARISON:  CT HEAD August 19, 2018 FINDINGS: MRI HEAD FINDINGS INTRACRANIAL CONTENTS: No reduced diffusion to suggest acute ischemia. No susceptibility artifact to suggest hemorrhage. The ventricles and sulci are normal for patient's age. Patchy supratentorial white matter FLAIR T2 hyperintensities. No suspicious parenchymal signal, masses, mass effect. No abnormal extra-axial fluid collections. No extra-axial masses. VASCULAR: Normal major intracranial vascular flow voids present at skull base. SKULL AND UPPER CERVICAL SPINE: No abnormal sellar expansion. No suspicious calvarial bone marrow signal. Craniocervical junction maintained. SINUSES/ORBITS: Mild paranasal sinus mucosal thickening. Included mastoid air cells are well aerated. Old LEFT medial orbital blowout fracture. OTHER: None. MRA HEAD FINDINGS ANTERIOR CIRCULATION: Normal flow related enhancement of the included cervical, petrous, cavernous and supraclinoid internal carotid arteries. Patent anterior communicating artery. Patent anterior and middle cerebral arteries. No large vessel occlusion, flow limiting stenosis, aneurysm. POSTERIOR CIRCULATION: LEFT vertebral artery is dominant. RIGHT vertebral artery terminates in the posterior inferior cerebellar artery. Vertebrobasilar arteries are patent, with normal flow related enhancement of the main branch vessels. Patent posterior cerebral arteries. Robust bilateral posterior communicating arteries present. No large vessel occlusion, flow limiting stenosis,  aneurysm. ANATOMIC VARIANTS: None. Source images and MIP images  were reviewed. IMPRESSION: MRI HEAD: 1. No acute intracranial process. 2. Mild-to-moderate chronic small vessel ischemic changes. MRA HEAD: 1. Negative noncontrast MRA head. Electronically Signed   By: Elon Alas M.D.   On: 08/20/2018 01:39   Dg Femur Min 2 Views Left  Result Date: 07/24/2018 CLINICAL DATA:  Acute left thigh pain. EXAM: LEFT FEMUR 2 VIEWS COMPARISON:  None. FINDINGS: There is no fracture or dislocation. No appreciable arthritis of left hip or left knee. Extensive arterial vascular calcifications in the thigh. IMPRESSION: Normal left femur. Electronically Signed   By: Lorriane Shire M.D.   On: 07/24/2018 13:40   Vas US Carotid (at [place] Only)  Result Date: 08/20/2018 Carotid Arterial Duplex Study Indications: TIA. Limitations: Patient body habitus, patient movement, patient anatomy Performing Technologist: Oliver Hum RVT  Examination Guidelines: A complete evaluation includes B-mode imaging, spectral Doppler, color Doppler, and power Doppler as needed of all accessible portions of each vessel. Bilateral testing is considered an integral part of a complete examination. Limited examinations for reoccurring indications may be performed as noted.  Right Carotid Findings: +----------+--------+--------+--------+-----------------------+--------+           PSV cm/sEDV cm/sStenosisDescribe               Comments +----------+--------+--------+--------+-----------------------+--------+ CCA Prox  58      8               smooth and heterogenous         +----------+--------+--------+--------+-----------------------+--------+ CCA Distal43      9               smooth and heterogenous         +----------+--------+--------+--------+-----------------------+--------+ ICA Prox  59      17              smooth and heterogenous         +----------+--------+--------+--------+-----------------------+--------+ ICA Distal51      18                                      tortuous +----------+--------+--------+--------+-----------------------+--------+ ECA  35      6                                      tortuous +----------+--------+--------+--------+-----------------------+--------+ +----------+--------+-------+--------+-------------------+           PSV cm/sEDV cmsDescribeArm Pressure (mmHG) +----------+--------+-------+--------+-------------------+ [id number]                                         +----------+--------+-------+--------+-------------------+ +---------+--------+--+--------+-+---------+ VertebralPSV cm/s32EDV cm/s8Antegrade +---------+--------+--+--------+-+---------+  Left Carotid Findings: +----------+--------+-------+--------+--------------------------------+--------+           PSV cm/sEDV    StenosisDescribe                        Comments                   cm/s                                                    +----------+--------+-------+--------+--------------------------------+--------+ CCA Prox  58      9              smooth and heterogenous                  +----------+--------+-------+--------+--------------------------------+--------+ CCA Distal45      9              smooth and heterogenous                  +----------+--------+-------+--------+--------------------------------+--------+ ICA Prox  78      25             smooth, heterogenous and                                                  calcific                                 +----------+--------+-------+--------+--------------------------------+--------+ ICA Distal57      25                                             tortuous +----------+--------+-------+--------+--------------------------------+--------+ ECA       44      6                                                       +----------+--------+-------+--------+--------------------------------+--------+  +----------+--------+--------+--------+-------------------+ SubclavianPSV cm/sEDV cm/sDescribeArm Pressure (mmHG) +----------+--------+--------+--------+-------------------+           105                                         +----------+--------+--------+--------+-------------------+ +---------+--------+--+--------+-+---------+  VertebralPSV cm/s32EDV cm/s7Antegrade +---------+--------+--+--------+-+---------+  Summary: Right Carotid: Velocities in the right ICA are consistent with a 1-39% stenosis. Left Carotid: Velocities in the left ICA are consistent with a 1-39% stenosis. Vertebrals: Bilateral vertebral arteries demonstrate antegrade flow. *See table(s) above for measurements and observations.  Electronically signed by Antony Contras MD on 08/20/2018 at 12:28:22 PM.    Final    Vas [place] Lower Extremity Venous (dvt) (only [place])  Result Date: 07/25/2018  Lower Venous Study Indications: Groin and proximal thigh pain.  Limitations: Patient tensing and holding breath while scanning thigh and groin area. Comparison Study: No prior study on file Performing Technologist: Sharion Dove RVS  Examination Guidelines: A complete evaluation includes B-mode imaging, spectral Doppler, color Doppler, and power Doppler as needed of all accessible portions of each vessel. Bilateral testing is considered an integral part of a complete examination. Limited examinations for reoccurring indications may be performed as noted.  Right Venous Findings: +---+---------------+---------+-----------+----------+-------+    CompressibilityPhasicitySpontaneityPropertiesSummary +---+---------------+---------+-----------+----------+-------+ CFVFull           Yes      Yes                          +---+---------------+---------+-----------+----------+-------+  Left Venous Findings: +---------+---------------+---------+-----------+----------+-------+           CompressibilityPhasicitySpontaneityPropertiesSummary +---------+---------------+---------+-----------+----------+-------+ CFV      Full           Yes      Yes                          +---------+---------------+---------+-----------+----------+-------+ SFJ      Full                                                 +---------+---------------+---------+-----------+----------+-------+ FV Prox  Full                                                 +---------+---------------+---------+-----------+----------+-------+ FV Mid   Full                                                 +---------+---------------+---------+-----------+----------+-------+ FV DistalFull                                                 +---------+---------------+---------+-----------+----------+-------+ PFV      Full                                                 +---------+---------------+---------+-----------+----------+-------+ POP      Full           Yes      Yes                          +---------+---------------+---------+-----------+----------+-------+  PTV      Full                                                 +---------+---------------+---------+-----------+----------+-------+ PERO     Full                                                 +---------+---------------+---------+-----------+----------+-------+    Summary: Right: No evidence of common femoral vein obstruction. Left: There is no evidence of deep vein thrombosis in the lower extremity.  *See table(s) above for measurements and observations. Electronically signed by Curt Jews MD on 07/25/2018 at 9:02:44 AM.    Final     EKG     EKG: EKG was personally reviewed and demonstrates: Sinus rhythm, 79, with T wave inversion in lateral leads (seen on prior tracings).   Telemetry: Telemetry was personally reviewed and demonstrates: Sinus rhythm with heart rate ranging from high 40's to 80's (brief episode in the  110's).   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  Total occlusion of the proximal to mid LAD filling late by left to left and right to left collaterals.  First diagonal contains 50% proximal narrowing.  Left main trunk is normal.  Circumflex gives origin to 3 obtuse marginal branches.  The first obtuse marginal contains 50% proximal narrowing.  Right coronary artery is dominant giving PDA and left ventricular branch.  Diffuse calcified 60% stenosis is noted throughout the mid vessel ending in the distal segment.  No high-grade focal obstruction is seen.  Left ventriculogram and LV pressures were not recorded.  Recommendations:  Angina is related to total occlusion of the proximal to mid LAD.  LIMA to the LAD is atretic.  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  Discussed with patient.  _______________  Echocardiogram 01/25/2016:  Study Conclusions: - Left ventricle: There was mild concentric hypertrophy. Systolic   function was mildly reduced. The estimated ejection fraction was   in the range of 45% to 50%. Diffuse hypokinesis. Features are   consistent with a pseudonormal left ventricular filling pattern,   with concomitant abnormal relaxation and increased filling   pressure (grade 2 diastolic dysfunction). Doppler parameters are   consistent with both elevated ventricular end-diastolic filling   pressure and elevated left atrial filling pressure. - Aortic valve: Normal-sized, mildly calcified annulus. Trileaflet.   Moderate calcification involving the left coronary and   noncoronary cusp. There was very mild stenosis. Mean gradient   (S): 13 mm Hg. Peak gradient (S):  26 mm Hg. Valve area (VTI):   1.02 cm^2. Valve area (Vmax): 0.86 cm^2. Valve area (Vmean): 0.9   cm^2. - Mitral valve: Calcified annulus. There was mild regurgitation.   Valve area by continuity equation (using LVOT flow): 1.35 cm^2. - Left atrium: The atrium was mildly dilated. - Atrial septum: No defect or patent foramen ovale was identified.  Assessment & Plan  Chest Pain with Known CAD s/p CABG in 1997 - Patient reports intermittent episodes of sharp left-sided chest pain mostly with exertion but occasionally wake him up from sleep. Pain is similar to prior pain. Pain also reproducible with palpation of chest wall.  - Most recent cardiac catheterization from 11/2017 showed atretric LIMA to LAD graft with total occlusion of the proximal to mid LAD filling late by left to right to left collaterals. CTO intervention would be very difficult given anomalous origin of the left main; therefore, intervention was not recommended at the time.  - EKG showed T wave inversion in lateral leads but these have been seen on prior tracings. - I-stat troponin in ED 0.09 in setting of ESRD.  - Echo pending. - Suspect pain is from known disease. Also may be a musculoskeletal component given pain is reproducible with palpation. Do not anticipate any further cardiac workup at this time. Can likely increase antianginals. Will discuss with MD.  Chronic Combined CHF  - Most recent Echo in 2017 showed LVEF of 45-50% with diffuse hypokinesis and grade 2 diastolic dysfunction. - Repeat Echo pending. - Patient does not appear volume overloaded on exam. - Volume is managed by dialysis.   Atrial Flutter s/p Ablation in 2018 - No recurrence since ablation.  - No evidence of atrial flutter on EKG or telemetry this admission. - No longer on chronic anticoagulation.  Hypertension - Currently well controlled. - Continue current medications.  Hyperlipidemia - Lipid panel this admission: Cholesterol 176,  Triglycerides 179, HDL 45, LDL 95. - LDL goal <70 given CAD. - Continue home Zetia. Previously on Crestor 10mg  daily but does not appear to be on this now. Has been intolerant to high-intensity statins in the past.   Signed, Darreld Mclean, PA-C 08/20/2018, 2:27 PM   Patient seen and examined   I agree with findnigns as noted above by Virgie Dad I am familiar with patient   I saw him back in jan 2019   He had moved to [place]   Hx of CAD with CABG in [place]   When I saw him he was having the same type of intermitt pains that he is having now. In May he went ahead and had L heart cath done    This showed an anomalous L system   LAD was occluded and filled via collaterals   LIMA was atretic After cath (with no intervnetion) the pt did well on medical therapy even up until September 2019    SOmetime after he began having intermitt symptoms of CP   The Pain is focal  Lasts at most 20 mn   Undergoes dialysis without a problem  ON exam: Lungs are Clear   Chest   Tender to palpation  L breast  Cardiac RRR   No S3   No signif murmurs  Ext without edema   Pt's pain is not completely typical   Not sure if I am bring on all the pain when I press chest    He may have some angina  BUT  I am not convinced it is any different than it was 1 year ago  I have reviewed with interventional service   WOudl recomm increasing antianginals   For now would increase imdur to 120 per day  WIll follow response as an outpt  Consider other agents at that time  Would add back Crestor    Will follow up as outpt  Dorris Carnes  For questions or updates, please  contact   Please consult [email or web address] for contact info under Cardiology/STEMI.

## 2018-08-20 NOTE — Progress Notes (Signed)
  Echocardiogram 2D Echocardiogram has been performed.  Patrick [name] 08/20/2018, 10:17 AM

## 2018-08-20 NOTE — Progress Notes (Signed)
PROGRESS NOTE   Patrick [name]  CHY:[id number]    DOB: Aug 08, 1955    DOA: 08/19/2018  PCP: Nolene Ebbs, MD   I have briefly reviewed patients previous medical records in [facility].  Brief Narrative:  63 year old male with PMH of CAD status post remote CABG in 1997, chronic combined CHF, atrial flutter status post ablation and not on anticoagulation, failed renal transplant, ESRD back on TTS HD since 2016, PAD, HTN, DM 2 presented to [facility] ED 08/19/2018 due to 2 days history of headache and nonbloody emesis, several weeks history of intermittent left arm weakness and couple of months history of progressively worsening left-sided chest pain.  Neurology consulted, stroke ruled out, TIA felt less likely, suspecting cervical radiculopathy, C-spine MRI pending.  Cardiology consulted, suspect combination of musculoskeletal chest pain and angina but no intervention other than increasing anti anginal medication at this time and outpatient follow-up.  Nephrology consulted for dialysis needs.   Assessment & Plan:   Principal Problem:   Nausea and vomiting Active Problems:   Essential hypertension   Chronic diastolic CHF (congestive heart failure) (HCC)   Controlled diabetes mellitus type 2 with complications (HCC)   ESRD (end stage renal disease) ([place])   1. Nausea and vomiting: Self-limiting acute GE,?  Viral versus post HD syndrome.  Resolved. 2. Left upper extremity weakness: Intermittent.  Neurology input appreciated.  Stroke ruled out.  Low index of suspicion for TIA.  Suspecting cervical radiculopathy, requested MRI C-spine and pending.  MRI and MRA brain negative.  Carotid ultrasound negative for significant stenosis.  TTE shows normal EF and no wall motion abnormalities. 3. Chest pain: Single elevated troponin of 0.09.  History suspicious for combination of musculoskeletal chest pain and worsening angina.  Cardiology thereby consulted and input appreciated.  They recommend increasing  antianginals/Imdur to 120 mg daily, adding back Crestor and outpatient follow-up. 4. ESRD on TTS HD: Nephrology consulted for dialysis in a.m. 5. CAD status post CABG: Management as noted above. 6. Chronic combined CHF: Clinically euvolemic.  Volume management across dialysis. 7. History of atrial flutter status post ablation 2018.  Not on chronic anticoagulation. 8. Essential hypertension: Mildly uncontrolled at times. 9. Hyperlipidemia: Continue Crestor and Zetia. 10. DM2: Mildly uncontrolled.  On repaglinide.   DVT prophylaxis: Subcutaneous heparin. Code Status: Full Family Communication: None at bedside Disposition: DC home pending clinical improvement and evaluation, likely 1/25.   Consultants:  Neurology Cardiology Nephrology  Procedures:  None  Antimicrobials:  None   Subjective: History as noted above.  Reports 2 days history of nausea and nonbloody emesis without abdominal pain or diarrhea.  No sickly contacts with similar symptoms or eating anything unusual.  Resolved without recurrence.  Tolerating diet.  Several weeks history of intermittent left upper extremity weakness, drops things but no tingling or numbness.  Reportedly treated for cellulitis with antibiotics by his nephrologist and then seen by vascular surgery.  History of chronic angina, progressively worsening left-sided chest pain, mostly with exertion when he climbs the 16 flights of stairs at home and while having sex and hence has avoided same.  At times has chest pain at rest while on dialysis.  Chest pain improves with rest.  Chest pain occasionally radiates to left upper extremity.  He also has a reproducible chest wall pain underneath the left breast-seen by PCP and evaluated with EKG earlier this week.  ROS: Above, otherwise negative.  Objective:  Vitals:   08/20/18 0600 08/20/18 0848 08/20/18 1331 08/20/18 1531  BP:  132/74 135/82 (!) 167/85 (!) 144/91  Pulse:  96 87 91  Resp: 18 20 (!) 22 20    Temp: 98.4 F (36.9 C) 97.7 F (36.5 C) 97.8 F (36.6 C) 97.7 F (36.5 C)  TempSrc: Oral Oral Oral Oral  SpO2:  98% 98% 100%  Weight:      Height:        Examination:  General exam: Pleasant middle-age male, moderately built and obese, lying comfortably propped up in bed. Respiratory system: Clear to auscultation. Respiratory effort normal.  Reproducible left-sided chest wall pain.  Has left HD catheter. Cardiovascular system: S1 & S2 heard, RRR. No JVD, murmurs, rubs, gallops or clicks. No pedal edema.  Telemetry personally reviewed: Sinus rhythm.  Occasional sinus bradycardia in the 30s and 40s, most likely during sleep at night. Gastrointestinal system: Abdomen is nondistended, soft and nontender. No organomegaly or masses felt. Normal bowel sounds heard. Central nervous system: Alert and oriented. No focal neurological deficits. Extremities: Symmetric 5 x 5 power.  No pronator drift. Skin: No rashes, lesions or ulcers Psychiatry: Judgement and insight appear normal. Mood & affect appropriate.     Data Reviewed: I have personally reviewed following labs and imaging studies  CBC: Recent Labs  Lab 08/19/18 1716  WBC 6.6  NEUTROABS 5.1  HGB 12.5*  HCT 41.8  MCV 90.3  PLT 417   Basic Metabolic Panel: Recent Labs  Lab 08/19/18 1716  NA 137  K 4.9  CL 99  CO2 22  GLUCOSE 148*  BUN 24*  CREATININE 10.12*  CALCIUM 10.5*   Liver Function Tests: Recent Labs  Lab 08/19/18 1716  AST 19  ALT 10  ALKPHOS 63  BILITOT 0.5  PROT 8.6*  ALBUMIN 4.1   Coagulation Profile: Recent Labs  Lab 08/19/18 1716  INR 1.06   HbA1C: Recent Labs    08/20/18 0723  HGBA1C 6.9*   CBG: Recent Labs  Lab 08/20/18 0843 08/20/18 1129 08/20/18 1633  GLUCAP 235* 121* 119*    Recent Results (from the past 240 hour(s))  MRSA PCR Screening     Status: None   Collection Time: 08/20/18  5:21 AM  Result Value Ref Range Status   MRSA by PCR NEGATIVE NEGATIVE Final     Comment:        The GeneXpert MRSA Assay (FDA approved for NASAL specimens only), is one component of a comprehensive MRSA colonization surveillance program. It is not intended to diagnose MRSA infection nor to guide or monitor treatment for MRSA infections. Performed at [facility], [place] [address] [place], [place] [address]          Radiology Studies: Dg Chest 2 View  Result Date: 08/19/2018 CLINICAL DATA:  Nausea and vomiting EXAM: CHEST - 2 VIEW COMPARISON:  05/16/2018 FINDINGS: Post sternotomy changes. Left-sided central venous catheter with tip over the right atrium. Right subclavian region stent. No pleural effusion or focal airspace disease. Cardiomegaly with aortic atherosclerosis. No pneumothorax. IMPRESSION: No active cardiopulmonary disease.  Cardiomegaly. Electronically Signed   By: Donavan Foil M.D.   On: 08/19/2018 22:28   Ct Head Wo Contrast  Result Date: 08/19/2018 CLINICAL DATA:  Headache and dizziness EXAM: CT HEAD WITHOUT CONTRAST TECHNIQUE: Contiguous axial images were obtained from the base of the skull through the vertex without intravenous contrast. COMPARISON:  None. FINDINGS: BRAIN: The ventricles and sulci are normal. Minimal small vessel ischemic disease of periventricular white matter. No intraparenchymal hemorrhage, mass effect nor midline shift. No acute  large vascular territory infarcts. Grey-white matter distinction is maintained. The basal ganglia are unremarkable. No abnormal extra-axial fluid collections. Basal cisterns are not effaced and midline. The brainstem and cerebellar hemispheres are without acute abnormalities. VASCULAR: No hyperdense vessel or unexpected calcification. Mild-to-moderate atherosclerosis of the carotid siphons bilaterally. SKULL/SOFT TISSUES: No skull fracture. No significant soft tissue swelling. ORBITS/SINUSES: The included ocular globes and orbital contents are normal.The mastoid air cells are clear. The included  paranasal sinuses are well-aerated. OTHER: None. IMPRESSION: Likely chronic minimal small vessel ischemic disease of periventricular white matter. No acute intracranial abnormality. Electronically Signed   By: Ashley Royalty M.D.   On: 08/19/2018 19:34   Mr Brain Wo Contrast  Result Date: 08/20/2018 CLINICAL DATA:  Headaches since last night, LEFT arm weakness today. History of end-stage renal disease on dialysis, hypertension and diabetes. EXAM: MRI HEAD WITHOUT CONTRAST MRA HEAD WITHOUT CONTRAST TECHNIQUE: Multiplanar, multiecho pulse sequences of the brain and surrounding structures were obtained without intravenous contrast. Angiographic images of the head were obtained using MRA technique without contrast. COMPARISON:  CT HEAD August 19, 2018 FINDINGS: MRI HEAD FINDINGS INTRACRANIAL CONTENTS: No reduced diffusion to suggest acute ischemia. No susceptibility artifact to suggest hemorrhage. The ventricles and sulci are normal for patient's age. Patchy supratentorial white matter FLAIR T2 hyperintensities. No suspicious parenchymal signal, masses, mass effect. No abnormal extra-axial fluid collections. No extra-axial masses. VASCULAR: Normal major intracranial vascular flow voids present at skull base. SKULL AND UPPER CERVICAL SPINE: No abnormal sellar expansion. No suspicious calvarial bone marrow signal. Craniocervical junction maintained. SINUSES/ORBITS: Mild paranasal sinus mucosal thickening. Included mastoid air cells are well aerated. Old LEFT medial orbital blowout fracture. OTHER: None. MRA HEAD FINDINGS ANTERIOR CIRCULATION: Normal flow related enhancement of the included cervical, petrous, cavernous and supraclinoid internal carotid arteries. Patent anterior communicating artery. Patent anterior and middle cerebral arteries. No large vessel occlusion, flow limiting stenosis, aneurysm. POSTERIOR CIRCULATION: LEFT vertebral artery is dominant. RIGHT vertebral artery terminates in the posterior inferior  cerebellar artery. Vertebrobasilar arteries are patent, with normal flow related enhancement of the main branch vessels. Patent posterior cerebral arteries. Robust bilateral posterior communicating arteries present. No large vessel occlusion, flow limiting stenosis,  aneurysm. ANATOMIC VARIANTS: None. Source images and MIP images were reviewed. IMPRESSION: MRI HEAD: 1. No acute intracranial process. 2. Mild-to-moderate chronic small vessel ischemic changes. MRA HEAD: 1. Negative noncontrast MRA head. Electronically Signed   By: Elon Alas M.D.   On: 08/20/2018 01:39   Mr Virgel Paling VH Contrast  Result Date: 08/20/2018 CLINICAL DATA:  Headaches since last night, LEFT arm weakness today. History of end-stage renal disease on dialysis, hypertension and diabetes. EXAM: MRI HEAD WITHOUT CONTRAST MRA HEAD WITHOUT CONTRAST TECHNIQUE: Multiplanar, multiecho pulse sequences of the brain and surrounding structures were obtained without intravenous contrast. Angiographic images of the head were obtained using MRA technique without contrast. COMPARISON:  CT HEAD August 19, 2018 FINDINGS: MRI HEAD FINDINGS INTRACRANIAL CONTENTS: No reduced diffusion to suggest acute ischemia. No susceptibility artifact to suggest hemorrhage. The ventricles and sulci are normal for patient's age. Patchy supratentorial white matter FLAIR T2 hyperintensities. No suspicious parenchymal signal, masses, mass effect. No abnormal extra-axial fluid collections. No extra-axial masses. VASCULAR: Normal major intracranial vascular flow voids present at skull base. SKULL AND UPPER CERVICAL SPINE: No abnormal sellar expansion. No suspicious calvarial bone marrow signal. Craniocervical junction maintained. SINUSES/ORBITS: Mild paranasal sinus mucosal thickening. Included mastoid air cells are well aerated. Old LEFT medial orbital blowout fracture. OTHER: None. MRA  HEAD FINDINGS ANTERIOR CIRCULATION: Normal flow related enhancement of the included  cervical, petrous, cavernous and supraclinoid internal carotid arteries. Patent anterior communicating artery. Patent anterior and middle cerebral arteries. No large vessel occlusion, flow limiting stenosis, aneurysm. POSTERIOR CIRCULATION: LEFT vertebral artery is dominant. RIGHT vertebral artery terminates in the posterior inferior cerebellar artery. Vertebrobasilar arteries are patent, with normal flow related enhancement of the main branch vessels. Patent posterior cerebral arteries. Robust bilateral posterior communicating arteries present. No large vessel occlusion, flow limiting stenosis,  aneurysm. ANATOMIC VARIANTS: None. Source images and MIP images were reviewed. IMPRESSION: MRI HEAD: 1. No acute intracranial process. 2. Mild-to-moderate chronic small vessel ischemic changes. MRA HEAD: 1. Negative noncontrast MRA head. Electronically Signed   By: Elon Alas M.D.   On: 08/20/2018 01:39   Vas US Carotid (at [place] Only)  Result Date: 08/20/2018 Carotid Arterial Duplex Study Indications: TIA. Limitations: Patient body habitus, patient movement, patient anatomy Performing Technologist: Oliver Hum RVT  Examination Guidelines: A complete evaluation includes B-mode imaging, spectral Doppler, color Doppler, and power Doppler as needed of all accessible portions of each vessel. Bilateral testing is considered an integral part of a complete examination. Limited examinations for reoccurring indications may be performed as noted.  Right Carotid Findings: +----------+--------+--------+--------+-----------------------+--------+           PSV cm/sEDV cm/sStenosisDescribe               Comments +----------+--------+--------+--------+-----------------------+--------+ CCA Prox  58      8               smooth and heterogenous         +----------+--------+--------+--------+-----------------------+--------+ CCA Distal43      9               smooth and heterogenous          +----------+--------+--------+--------+-----------------------+--------+ ICA Prox  59      17              smooth and heterogenous         +----------+--------+--------+--------+-----------------------+--------+ ICA Distal51      18                                     tortuous +----------+--------+--------+--------+-----------------------+--------+ ECA       35      6                                      tortuous +----------+--------+--------+--------+-----------------------+--------+ +----------+--------+-------+--------+-------------------+           PSV cm/sEDV cmsDescribeArm Pressure (mmHG) +----------+--------+-------+--------+-------------------+ CHENIDPOEU23                                         +----------+--------+-------+--------+-------------------+ +---------+--------+--+--------+-+---------+ VertebralPSV cm/s32EDV cm/s8Antegrade +---------+--------+--+--------+-+---------+  Left Carotid Findings: +----------+--------+-------+--------+--------------------------------+--------+           PSV cm/sEDV    StenosisDescribe                        Comments                   cm/s                                                    +----------+--------+-------+--------+--------------------------------+--------+  CCA Prox  58      9              smooth and heterogenous                  +----------+--------+-------+--------+--------------------------------+--------+ CCA Distal45      9              smooth and heterogenous                  +----------+--------+-------+--------+--------------------------------+--------+ ICA Prox  78      25             smooth, heterogenous and                                                  calcific                                 +----------+--------+-------+--------+--------------------------------+--------+ ICA Distal57      25                                             tortuous  +----------+--------+-------+--------+--------------------------------+--------+ ECA       44      6                                                       +----------+--------+-------+--------+--------------------------------+--------+ +----------+--------+--------+--------+-------------------+ SubclavianPSV cm/sEDV cm/sDescribeArm Pressure (mmHG) +----------+--------+--------+--------+-------------------+           105                                         +----------+--------+--------+--------+-------------------+ +---------+--------+--+--------+-+---------+ VertebralPSV cm/s32EDV cm/s7Antegrade +---------+--------+--+--------+-+---------+  Summary: Right Carotid: Velocities in the right ICA are consistent with a 1-39% stenosis. Left Carotid: Velocities in the left ICA are consistent with a 1-39% stenosis. Vertebrals: Bilateral vertebral arteries demonstrate antegrade flow. *See table(s) above for measurements and observations.  Electronically signed by Antony Contras MD on 08/20/2018 at 12:28:22 PM.    Final         Scheduled Meds: . aspirin EC  81 mg Oral Daily  . baclofen  10 mg Oral BID  . calcium acetate  1,334 mg Oral TID WC  . clopidogrel  75 mg Oral Daily  . ezetimibe  10 mg Oral Daily  . heparin  5,000 Units Subcutaneous Q8H  . isosorbide mononitrate  120 mg Oral Daily  . LORazepam  2 mg Intravenous Once  . repaglinide  0.5 mg Oral BID AC  . rosuvastatin  10 mg Oral q1800   Continuous Infusions:   LOS: 0 days     Vernell Leep, MD, FACP, [facility]  To contact the attending provider between 7A-7P or the covering provider during after hours 7P-7A, please log into the web site [email or web address] and access using universal [place] password for that web site. If you do not have the password, please call the hospital operator.  08/20/2018, 4:58 PM

## 2018-08-20 NOTE — Progress Notes (Signed)
OT Cancellation Note  Patient Details Name: Patrick [name] MRN: [id number] DOB: 17-Jan-1956   Cancelled Treatment:    Reason Eval/Treat Not Completed: OT screened, no needs identified, will sign off.  Pt has returned to baseline.  Lucille Passy, OTR/L Acute Rehabilitation Services Pager [phone] Office [phone]   Lucille Passy M 08/20/2018, 4:11 PM

## 2018-08-20 NOTE — Care Management Note (Signed)
Case Management Note  Patient Details  Name: Patrick [name] MRN: [id number] Date of Birth: [birth year]  Subjective/Objective:   Pt in to r/o CVA. He is from home with spouse who can provide supervision.  DME: none  No issues obtaining his meds.  No issues with transportation.                 Action/Plan: No f/u per PT. CM following for d/c needs, physician orders.  Expected Discharge Date:  08/21/18               Expected Discharge Plan:  Home/Self Care  In-House Referral:     Discharge planning Services     Post Acute Care Choice:    Choice offered to:     DME Arranged:    DME Agency:     HH Arranged:    HH Agency:     Status of Service:  In process, will continue to follow  If discussed at Long Length of Stay Meetings, dates discussed:    Additional Comments:  Pollie Friar, RN 08/20/2018, 3:08 PM

## 2018-08-20 NOTE — Progress Notes (Signed)
Carotid artery duplex has been completed. Preliminary results can be found in CV Proc through chart review.   08/20/18 12:10 PM Patrick [name] RVT

## 2018-08-20 NOTE — Evaluation (Addendum)
Physical Therapy Evaluation Patient Details Name: Patrick [name] MRN: [id number] DOB: [birth year] Today's Date: 08/20/2018   History of Present Illness  63 y.o. male admitted with head ache and LUE weakness. Past medical history significant for ESRD on dialysis TTS, diastolic CHF, A.Flutter s/p ablation in 2018, DM2, HTN.  Clinical Impression  Pt appears to be at baseline of independence with mobility, he reports LUE weakness has completely resolved. He ambulated 250' without an assistive device, no loss of balance. LUE strength/AROM/coordination/sensation are grossly intact. Signs/symptoms of stroke were reviewed, importance of seeing treatment early if stroke symptoms are present was emphasized.  No further PT indicated, will sign off.  He is safe to ambulate in the halls during acute stay.     Follow Up Recommendations No PT follow up    Equipment Recommendations  None recommended by PT    Recommendations for Other Services       Precautions / Restrictions Precautions Precautions: None Restrictions Weight Bearing Restrictions: No      Mobility  Bed Mobility               General bed mobility comments: up on edge of bed  Transfers Overall transfer level: Independent Equipment used: None                Ambulation/Gait Ambulation/Gait assistance: Independent Gait Distance (Feet): 250 Feet Assistive device: None Gait Pattern/deviations: WFL(Within Functional Limits) Gait velocity: WNL   General Gait Details: steady, no loss of balance  Stairs            Wheelchair Mobility    Modified Rankin (Stroke Patients Only) Modified Rankin (Stroke Patients Only) Pre-Morbid Rankin Score: No symptoms Modified Rankin: No symptoms     Balance Overall balance assessment: Independent                                           Pertinent Vitals/Pain Pain Assessment: No/denies pain    Home Living Family/patient expects to be discharged  to:: Private residence Living Arrangements: Spouse/significant other Available Help at Discharge: Family;Available 24 hours/day Type of Home: Apartment Home Access: Stairs to enter Entrance Stairs-Rails: Psychiatric nurse of Steps: 14 Home Layout: One level Home Equipment: None      Prior Function Level of Independence: Independent         Comments: Patient is a limited community ambulator and drives. Uses a scooter for grocery store shopping due to LE pain with prolonged walking.     Hand Dominance        Extremity/Trunk Assessment   Upper Extremity Assessment Upper Extremity Assessment: Defer to OT evaluation(pt reports LUE deficits have fully resolved; strength, AROM, coordination. sensation grossly intact)    Lower Extremity Assessment Lower Extremity Assessment: Overall WFL for tasks assessed    Cervical / Trunk Assessment Cervical / Trunk Assessment: Normal  Communication   Communication: No difficulties  Cognition Arousal/Alertness: Awake/alert Behavior During Therapy: WFL for tasks assessed/performed Overall Cognitive Status: Within Functional Limits for tasks assessed                                        General Comments      Exercises     Assessment/Plan    PT Assessment Patent does not need any further PT services  PT  Problem List         PT Treatment Interventions      PT Goals (Current goals can be found in the Care Plan section)  Acute Rehab PT Goals PT Goal Formulation: All assessment and education complete, DC therapy    Frequency     Barriers to discharge        Co-evaluation               AM-PAC PT "6 Clicks" Mobility  Outcome Measure Help needed turning from your back to your side while in a flat bed without using bedrails?: None Help needed moving from lying on your back to sitting on the side of a flat bed without using bedrails?: None Help needed moving to and from a bed to a chair  (including a wheelchair)?: None Help needed standing up from a chair using your arms (e.g., wheelchair or bedside chair)?: None Help needed to walk in hospital room?: None Help needed climbing 3-5 steps with a railing? : None 6 Click Score: 24    End of Session Equipment Utilized During Treatment: Gait belt Activity Tolerance: Patient tolerated treatment well Patient left: in chair;with call bell/phone within reach Nurse Communication: Mobility status PT Visit Diagnosis: Muscle weakness (generalized) (M62.81)    Time: 8381-8403 PT Time Calculation (min) (ACUTE ONLY): 21 min   Charges:   PT Evaluation $PT Eval Low Complexity: 1 Low          Philomena Doheny PT 08/20/2018  Acute Rehabilitation Services Pager [phone] Office [phone]

## 2018-08-20 NOTE — Progress Notes (Signed)
SLP Cancellation Note  Patient Details Name: Patrick [name] MRN: [id number] DOB: Apr 14, 1956   Cancelled treatment:       Reason Eval/Treat Not Completed: SLP screened, no needs identified, will sign off   Pt believes that his symptoms have resolved and denies any transient changed in cognition or communication. MRI negative for acute changes. No SLP recommended at this time, although [facility] FAST education was reviewed today.    Venita Sheffield Clarise Chacko 08/20/2018, 12:12 PM  Nuala Alpha, M.A. [place] Acute Environmental education officer [phone] Office [phone]

## 2018-08-21 ENCOUNTER — Encounter (HOSPITAL_COMMUNITY): Payer: Self-pay | Admitting: Nephrology

## 2018-08-21 DIAGNOSIS — Z992 Dependence on renal dialysis: Secondary | ICD-10-CM | POA: Diagnosis not present

## 2018-08-21 DIAGNOSIS — I10 Essential (primary) hypertension: Secondary | ICD-10-CM | POA: Diagnosis not present

## 2018-08-21 DIAGNOSIS — R079 Chest pain, unspecified: Secondary | ICD-10-CM | POA: Diagnosis not present

## 2018-08-21 DIAGNOSIS — M5412 Radiculopathy, cervical region: Secondary | ICD-10-CM | POA: Diagnosis not present

## 2018-08-21 DIAGNOSIS — N186 End stage renal disease: Secondary | ICD-10-CM | POA: Diagnosis not present

## 2018-08-21 DIAGNOSIS — R112 Nausea with vomiting, unspecified: Secondary | ICD-10-CM | POA: Diagnosis not present

## 2018-08-21 DIAGNOSIS — R29898 Other symptoms and signs involving the musculoskeletal system: Secondary | ICD-10-CM | POA: Diagnosis not present

## 2018-08-21 DIAGNOSIS — I209 Angina pectoris, unspecified: Secondary | ICD-10-CM

## 2018-08-21 LAB — CBC
HCT: 37.2 % — ABNORMAL LOW (ref 39.0–52.0)
Hemoglobin: 11 g/dL — ABNORMAL LOW (ref 13.0–17.0)
MCH: 26.7 pg (ref 26.0–34.0)
MCHC: 29.6 g/dL — AB (ref 30.0–36.0)
MCV: 90.3 fL (ref 80.0–100.0)
Platelets: 156 10*3/uL (ref 150–400)
RBC: 4.12 MIL/uL — ABNORMAL LOW (ref 4.22–5.81)
RDW: 18.2 % — ABNORMAL HIGH (ref 11.5–15.5)
WBC: 6.3 10*3/uL (ref 4.0–10.5)
nRBC: 0 % (ref 0.0–0.2)

## 2018-08-21 LAB — RENAL FUNCTION PANEL
ALBUMIN: 3.5 g/dL (ref 3.5–5.0)
Anion gap: 17 — ABNORMAL HIGH (ref 5–15)
BUN: 47 mg/dL — ABNORMAL HIGH (ref 8–23)
CO2: 24 mmol/L (ref 22–32)
Calcium: 9.3 mg/dL (ref 8.9–10.3)
Chloride: 94 mmol/L — ABNORMAL LOW (ref 98–111)
Creatinine, Ser: 14.8 mg/dL — ABNORMAL HIGH (ref 0.61–1.24)
GFR calc Af Amer: 4 mL/min — ABNORMAL LOW (ref >60)
GFR calc non Af Amer: 3 mL/min — ABNORMAL LOW (ref >60)
GLUCOSE: 118 mg/dL — AB (ref 70–99)
Phosphorus: 5.9 mg/dL — ABNORMAL HIGH (ref 2.5–4.6)
Potassium: 5.2 mmol/L — ABNORMAL HIGH (ref 3.5–5.1)
Sodium: 135 mmol/L (ref 135–145)

## 2018-08-21 MED ORDER — ISOSORBIDE MONONITRATE ER 60 MG PO TB24: 120.0000 mg | ORAL_TABLET | Freq: Every day | ORAL | 0 refills | Status: DC

## 2018-08-21 MED ORDER — ROSUVASTATIN CALCIUM 10 MG PO TABS: 10.0000 mg | ORAL_TABLET | Freq: Every day | ORAL | 0 refills | Status: DC

## 2018-08-21 MED ORDER — HEPARIN SODIUM (PORCINE) 1000 UNIT/ML IJ SOLN
INTRAMUSCULAR | Status: AC
  Administered 2018-08-21: 2200 [IU]
  Filled 2018-08-21: qty 3

## 2018-08-21 MED ORDER — HEPARIN SODIUM (PORCINE) 1000 UNIT/ML IJ SOLN
INTRAMUSCULAR | Status: AC
  Administered 2018-08-21: 4200 [IU]
  Filled 2018-08-21: qty 5

## 2018-08-21 NOTE — Procedures (Signed)
   I was present at this dialysis session, have reviewed the session itself and made  appropriate changes Kelly Splinter MD [place] pager [phone]   08/21/2018, 8:51 AM

## 2018-08-21 NOTE — Discharge Summary (Signed)
Physician Discharge Summary  KAYDYN SAYAS JJO:[id number] DOB: 04-Dec-1955  PCP: Nolene Ebbs, MD  Admit date: 08/19/2018 Discharge date: 08/21/2018  Recommendations for Outpatient Follow-up:  1. Dr. Nolene Ebbs, PCP in 1 week. 2. Hemodialysis Center: Continue regular hemodialysis appointments on Tuesdays, Thursdays and Saturdays. 3. Dr. Dorris Carnes, Cardiology  Home Health: None Equipment/Devices: None  Discharge Condition: Improved and stable CODE STATUS: Full Diet recommendation: Heart healthy & diabetic diet.  Discharge Diagnoses:  Principal Problem:   Nausea and vomiting Active Problems:   Essential hypertension   Chronic diastolic CHF (congestive heart failure) (HCC)   Controlled diabetes mellitus type 2 with complications (HCC)   ESRD (end stage renal disease) [facility])   Brief Summary: 63 year old male with PMH of CAD status post remote CABG in 1997, chronic combined CHF, atrial flutter status post ablation and not on anticoagulation, failed renal transplant, ESRD back on TTS HD since 2016, PAD, HTN, DM 2 presented to [facility] ED 08/19/2018 due to 2 days history of headache and nonbloody emesis, several weeks history of intermittent left arm weakness and couple of months history of progressively worsening left-sided chest pain.  Neurology consulted, stroke ruled out, TIA felt less likely, suspecting cervical radiculopathy, C-spine MRI performed.  Cardiology consulted, suspect combination of musculoskeletal chest pain and angina but no intervention other than increasing anti anginal medication at this time and outpatient follow-up.  Nephrology consulted for dialysis needs.   Assessment & Plan:   1. Nausea and vomiting: Self-limiting acute GE,?  Viral versus post HD syndrome.  Resolved without recurrence. 2. Left upper extremity weakness: Intermittent.  Neurology input appreciated.  Stroke ruled out.  Low index of suspicion for TIA. MRI and MRA brain negative.  Carotid ultrasound  negative for significant stenosis.  TTE shows normal EF and no wall motion abnormalities.  As per neurology follow-up, overall symptoms and cervical spine MRI findings noted below are most consistent with a transient compressive radiculopathy as the etiology for his symptoms.  I discussed with Neurology, patient declined surgical evaluation and moreover surgical risks probably outweigh benefits at this time.  This can be reconsidered in the future if his symptoms recur or worsen.  Currently resolved.  Patient reported to the Nephrologist that his symptoms occurred after baclofen which is contraindicated in ESRD and was discontinued at discharge. 3. Chest pain: Single elevated troponin of 0.09.  History suspicious for combination of musculoskeletal chest pain and worsening angina.  Cardiology thereby consulted and input appreciated.  They recommend increasing antianginals/Imdur to 120 mg daily, adding back Crestor and outpatient follow-up.  No recurrence of chest pain. 4. ESRD on TTS HD: Nephrology was consulted and patient underwent his regular scheduled dialysis on day of discharge.  Continue outpatient HD. 5. CAD status post CABG: Management as noted above. 6. Chronic combined CHF: Clinically euvolemic.  Volume management across dialysis. 7. History of atrial flutter status post ablation 2018.  Not on chronic anticoagulation. 8. Essential hypertension: Mildly uncontrolled at times. 9. Hyperlipidemia: Continue Crestor and Zetia. 10. DM2: Mildly uncontrolled.  On repaglinide. 11. Anemia in ESRD: Follow CBC as outpatient across HD. 12. Mild hyperkalemia: Potassium 5.2.  Underwent HD today.  Follow renal panel across dialysis as outpatient.   Consultants:  Neurology Cardiology Nephrology  Procedures:  HD   Discharge Instructions  Discharge Instructions    (HEART FAILURE PATIENTS) Call MD:  Anytime you have any of the following symptoms: 1) 3 pound weight gain in 24 hours or 5 pounds in 1  week 2)  shortness of breath, with or without a dry hacking cough 3) swelling in the hands, feet or stomach 4) if you have to sleep on extra pillows at night in order to breathe.   Complete by:  As directed    Call MD for:   Complete by:  As directed    Recurrent strokelike symptoms.   Call MD for:  difficulty breathing, headache or visual disturbances   Complete by:  As directed    Call MD for:  extreme fatigue   Complete by:  As directed    Call MD for:  persistant dizziness or light-headedness   Complete by:  As directed    Call MD for:  persistant nausea and vomiting   Complete by:  As directed    Call MD for:  severe uncontrolled pain   Complete by:  As directed    Call MD for:  temperature >100.4   Complete by:  As directed    Diet - low sodium heart healthy   Complete by:  As directed    Diet Carb Modified   Complete by:  As directed    Increase activity slowly   Complete by:  As directed        Medication List    STOP taking these medications   baclofen 10 MG tablet Commonly known as:  LIORESAL     TAKE these medications   acetaminophen 500 MG tablet Commonly known as:  TYLENOL Take 1,000 mg by mouth every 6 (six) hours as needed for moderate pain or headache.   albuterol 108 (90 Base) MCG/ACT inhaler Commonly known as:  PROVENTIL HFA;VENTOLIN HFA Inhale 1-2 puffs into the lungs every 6 (six) hours as needed for wheezing or shortness of breath.   amLODipine 5 MG tablet Commonly known as:  NORVASC Take 1 tablet (5 mg total) by mouth daily.   aspirin EC 81 MG tablet Take 81 mg by mouth daily.   calcium acetate 667 MG capsule Commonly known as:  PHOSLO Take 1,334 mg by mouth 3 (three) times daily with meals.   clopidogrel 75 MG tablet Commonly known as:  PLAVIX Take 1 tablet (75 mg total) by mouth daily.   ezetimibe 10 MG tablet Commonly known as:  ZETIA Take 1 tablet (10 mg total) by mouth daily.   isosorbide mononitrate 60 MG 24 hr tablet Commonly  known as:  IMDUR Take 2 tablets (120 mg total) by mouth daily. What changed:  how much to take   repaglinide 0.5 MG tablet Commonly known as:  PRANDIN Take 1 tablet (0.5 mg total) by mouth 2 (two) times daily before a meal.   rosuvastatin 10 MG tablet Commonly known as:  CRESTOR Take 1 tablet (10 mg total) by mouth daily at 6 PM.      Follow-up Information    Nolene Ebbs, MD. Schedule an appointment as soon as possible for a visit in 1 week(s).   Specialty:  Internal Medicine Contact information: [address] [place] [address] [phone]        Fay Records, MD. Schedule an appointment as soon as possible for a visit.   Specialty:  Cardiology Contact information: [place] [address] [phone]        Constance Haw, MD .   Specialty:  Cardiology Contact information: [address] [place] [address] [place] Follow up.   Why:  Continue regular  hemodialysis on Tuesdays, Thursdays and Saturdays.         Allergies  Allergen Reactions  . Iodinated Diagnostic Agents Swelling, Rash and Other (See Comments)    Other Reaction: burning to mouth, swelling of lips. Other Reaction: burning to mouth, swelling of lips.  . Lipitor [Atorvastatin] Other (See Comments)    Leg pain  . Metoprolol Other (See Comments)    Headaches       Procedures/Studies: Dg Chest 2 View  Result Date: 08/19/2018 CLINICAL DATA:  Nausea and vomiting EXAM: CHEST - 2 VIEW COMPARISON:  05/16/2018 FINDINGS: Post sternotomy changes. Left-sided central venous catheter with tip over the right atrium. Right subclavian region stent. No pleural effusion or focal airspace disease. Cardiomegaly with aortic atherosclerosis. No pneumothorax. IMPRESSION: No active cardiopulmonary disease.  Cardiomegaly. Electronically Signed   By: Donavan Foil M.D.   On: 08/19/2018 22:28   Ct Head Wo Contrast  Result  Date: 08/19/2018 CLINICAL DATA:  Headache and dizziness EXAM: CT HEAD WITHOUT CONTRAST TECHNIQUE: Contiguous axial images were obtained from the base of the skull through the vertex without intravenous contrast. COMPARISON:  None. FINDINGS: BRAIN: The ventricles and sulci are normal. Minimal small vessel ischemic disease of periventricular white matter. No intraparenchymal hemorrhage, mass effect nor midline shift. No acute large vascular territory infarcts. Grey-white matter distinction is maintained. The basal ganglia are unremarkable. No abnormal extra-axial fluid collections. Basal cisterns are not effaced and midline. The brainstem and cerebellar hemispheres are without acute abnormalities. VASCULAR: No hyperdense vessel or unexpected calcification. Mild-to-moderate atherosclerosis of the carotid siphons bilaterally. SKULL/SOFT TISSUES: No skull fracture. No significant soft tissue swelling. ORBITS/SINUSES: The included ocular globes and orbital contents are normal.The mastoid air cells are clear. The included paranasal sinuses are well-aerated. OTHER: None. IMPRESSION: Likely chronic minimal small vessel ischemic disease of periventricular white matter. No acute intracranial abnormality. Electronically Signed   By: Ashley Royalty M.D.   On: 08/19/2018 19:34   Mr Brain Wo Contrast  Result Date: 08/20/2018 CLINICAL DATA:  Headaches since last night, LEFT arm weakness today. History of end-stage renal disease on dialysis, hypertension and diabetes. EXAM: MRI HEAD WITHOUT CONTRAST MRA HEAD WITHOUT CONTRAST TECHNIQUE: Multiplanar, multiecho pulse sequences of the brain and surrounding structures were obtained without intravenous contrast. Angiographic images of the head were obtained using MRA technique without contrast. COMPARISON:  CT HEAD August 19, 2018 FINDINGS: MRI HEAD FINDINGS INTRACRANIAL CONTENTS: No reduced diffusion to suggest acute ischemia. No susceptibility artifact to suggest hemorrhage. The  ventricles and sulci are normal for patient's age. Patchy supratentorial white matter FLAIR T2 hyperintensities. No suspicious parenchymal signal, masses, mass effect. No abnormal extra-axial fluid collections. No extra-axial masses. VASCULAR: Normal major intracranial vascular flow voids present at skull base. SKULL AND UPPER CERVICAL SPINE: No abnormal sellar expansion. No suspicious calvarial bone marrow signal. Craniocervical junction maintained. SINUSES/ORBITS: Mild paranasal sinus mucosal thickening. Included mastoid air cells are well aerated. Old LEFT medial orbital blowout fracture. OTHER: None. MRA HEAD FINDINGS ANTERIOR CIRCULATION: Normal flow related enhancement of the included cervical, petrous, cavernous and supraclinoid internal carotid arteries. Patent anterior communicating artery. Patent anterior and middle cerebral arteries. No large vessel occlusion, flow limiting stenosis, aneurysm. POSTERIOR CIRCULATION: LEFT vertebral artery is dominant. RIGHT vertebral artery terminates in the posterior inferior cerebellar artery. Vertebrobasilar arteries are patent, with normal flow related enhancement of the main branch vessels. Patent posterior cerebral arteries. Robust bilateral posterior communicating arteries present. No large vessel occlusion, flow limiting stenosis,  aneurysm. ANATOMIC VARIANTS: None.  Source images and MIP images were reviewed. IMPRESSION: MRI HEAD: 1. No acute intracranial process. 2. Mild-to-moderate chronic small vessel ischemic changes. MRA HEAD: 1. Negative noncontrast MRA head. Electronically Signed   By: Elon Alas M.D.   On: 08/20/2018 01:39   Mr Cervical Spine Wo Contrast  Result Date: 08/20/2018 CLINICAL DATA:  Initial evaluation for headache with several week history of intermittent left arm weakness and progressively worsening left-sided chest pain. EXAM: MRI CERVICAL SPINE WITHOUT CONTRAST TECHNIQUE: Multiplanar, multisequence MR imaging of the cervical spine  was performed. No intravenous contrast was administered. COMPARISON:  None available. FINDINGS: Alignment: Examination severely degraded by motion artifact. Straightening with slight reversal of the normal cervical lordosis, apex at C4-5. No listhesis or malalignment. Vertebrae: Vertebral body heights maintained without evidence for acute or chronic fracture. Bone marrow signal intensity within normal limits. No appreciable discrete or worrisome osseous lesions identified on this motion degraded exam. No abnormal marrow edema. Cord: Signal intensity within the cervical spinal cord grossly within normal limits. Posterior Fossa, vertebral arteries, paraspinal tissues: Visualized brain and posterior fossa within normal limits. Craniocervical junction normal. Paraspinous and prevertebral soft tissues demonstrate no obvious abnormality. Normal intravascular flow voids within the vertebral arteries grossly maintained. Disc levels: C2-C3: Negative. C3-C4: Chronic intervertebral disc space narrowing with diffuse disc bulge and bilateral uncovertebral hypertrophy. Flattening of the ventral CSF without significant spinal stenosis. Moderate left with mild right C4 foraminal stenosis. C4-C5: Diffuse degenerative disc osteophyte with intervertebral disc space narrowing. Posterior component indents and partially faces the ventral thecal sac. Mild spinal stenosis without cord deformity. Suspected mild to moderate left C5 foraminal narrowing. C5-C6: Diffuse degenerative disc osteophyte with intervertebral disc space narrowing. Flattening of the ventral CSF with resultant mild spinal stenosis. No cord deformity. Moderate right with mild left C6 foraminal stenosis. C6-C7: Diffuse degenerative disc osteophyte with intervertebral disc space narrowing. Right greater than left uncovertebral spurring. No significant spinal stenosis. Severe right with moderate to severe left C7 foraminal narrowing. C7-T1: Mild facet hypertrophy. No  significant spinal stenosis. Foramina are grossly patent. Visualized upper thoracic spine demonstrates no significant finding. IMPRESSION: 1. Technically limited study due to severe motion artifact. 2. 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 No cord impingement or deformity. 3. Multifactorial degenerative changes with resultant multilevel foraminal narrowing as above. Notable findings include moderate left C4 foraminal stenosis, mild to moderate left C5 foraminal narrowing, moderate right C6 foraminal stenosis, with severe right and moderate to severe left C7 foraminal narrowing. Electronically Signed   By: Jeannine Boga M.D.   On: 08/20/2018 19:29   Mr Jodene Nam Head Wo Contrast  Result Date: 08/20/2018 CLINICAL DATA:  Headaches since last night, LEFT arm weakness today. History of end-stage renal disease on dialysis, hypertension and diabetes. EXAM: MRI HEAD WITHOUT CONTRAST MRA HEAD WITHOUT CONTRAST TECHNIQUE: Multiplanar, multiecho pulse sequences of the brain and surrounding structures were obtained without intravenous contrast. Angiographic images of the head were obtained using MRA technique without contrast. COMPARISON:  CT HEAD August 19, 2018 FINDINGS: MRI HEAD FINDINGS INTRACRANIAL CONTENTS: No reduced diffusion to suggest acute ischemia. No susceptibility artifact to suggest hemorrhage. The ventricles and sulci are normal for patient's age. Patchy supratentorial white matter FLAIR T2 hyperintensities. No suspicious parenchymal signal, masses, mass effect. No abnormal extra-axial fluid collections. No extra-axial masses. VASCULAR: Normal major intracranial vascular flow voids present at skull base. SKULL AND UPPER CERVICAL SPINE: No abnormal sellar expansion. No  suspicious calvarial bone marrow signal. Craniocervical junction maintained. SINUSES/ORBITS: Mild paranasal sinus mucosal thickening. Included mastoid air cells  are well aerated. Old LEFT medial orbital blowout fracture. OTHER: None. MRA HEAD FINDINGS ANTERIOR CIRCULATION: Normal flow related enhancement of the included cervical, petrous, cavernous and supraclinoid internal carotid arteries. Patent anterior communicating artery. Patent anterior and middle cerebral arteries. No large vessel occlusion, flow limiting stenosis, aneurysm. POSTERIOR CIRCULATION: LEFT vertebral artery is dominant. RIGHT vertebral artery terminates in the posterior inferior cerebellar artery. Vertebrobasilar arteries are patent, with normal flow related enhancement of the main branch vessels. Patent posterior cerebral arteries. Robust bilateral posterior communicating arteries present. No large vessel occlusion, flow limiting stenosis,  aneurysm. ANATOMIC VARIANTS: None. Source images and MIP images were reviewed. IMPRESSION: MRI HEAD: 1. No acute intracranial process. 2. Mild-to-moderate chronic small vessel ischemic changes. MRA HEAD: 1. Negative noncontrast MRA head. Electronically Signed   By: Elon Alas M.D.   On: 08/20/2018 01:39   Vas US Carotid (at [place] Only)  Result Date: 08/20/2018 Carotid Arterial Duplex Study Indications: TIA. Limitations: Patient body habitus, patient movement, patient anatomy Performing Technologist: Oliver Hum RVT  Examination Guidelines: A complete evaluation includes B-mode imaging, spectral Doppler, color Doppler, and power Doppler as needed of all accessible portions of each vessel. Bilateral testing is considered an integral part of a complete examination. Limited examinations for reoccurring indications may be performed as noted.  Right Carotid Findings: +----------+--------+--------+--------+-----------------------+--------+           PSV cm/sEDV cm/sStenosisDescribe               Comments +----------+--------+--------+--------+-----------------------+--------+ CCA Prox  58      8               smooth and heterogenous          +----------+--------+--------+--------+-----------------------+--------+ CCA Distal43      9               smooth and heterogenous         +----------+--------+--------+--------+-----------------------+--------+ ICA Prox  59      17              smooth and heterogenous         +----------+--------+--------+--------+-----------------------+--------+ ICA Distal51      18                                     tortuous +----------+--------+--------+--------+-----------------------+--------+ ECA       35      6                                      tortuous +----------+--------+--------+--------+-----------------------+--------+ +----------+--------+-------+--------+-------------------+           PSV cm/sEDV cmsDescribeArm Pressure (mmHG) +----------+--------+-------+--------+-------------------+ [id number]                                         +----------+--------+-------+--------+-------------------+ +---------+--------+--+--------+-+---------+ VertebralPSV cm/s32EDV cm/s8Antegrade +---------+--------+--+--------+-+---------+  Left Carotid Findings: +----------+--------+-------+--------+--------------------------------+--------+           PSV cm/sEDV    StenosisDescribe                        Comments  cm/s                                                    +----------+--------+-------+--------+--------------------------------+--------+ CCA Prox  58      9              smooth and heterogenous                  +----------+--------+-------+--------+--------------------------------+--------+ CCA Distal45      9              smooth and heterogenous                  +----------+--------+-------+--------+--------------------------------+--------+ ICA Prox  78      25             smooth, heterogenous and                                                  calcific                                  +----------+--------+-------+--------+--------------------------------+--------+ ICA Distal57      25                                             tortuous +----------+--------+-------+--------+--------------------------------+--------+ ECA       44      6                                                       +----------+--------+-------+--------+--------------------------------+--------+ +----------+--------+--------+--------+-------------------+ SubclavianPSV cm/sEDV cm/sDescribeArm Pressure (mmHG) +----------+--------+--------+--------+-------------------+           105                                         +----------+--------+--------+--------+-------------------+ +---------+--------+--+--------+-+---------+ VertebralPSV cm/s32EDV cm/s7Antegrade +---------+--------+--+--------+-+---------+  Summary: Right Carotid: Velocities in the right ICA are consistent with a 1-39% stenosis. Left Carotid: Velocities in the left ICA are consistent with a 1-39% stenosis. Vertebrals: Bilateral vertebral arteries demonstrate antegrade flow. *See table(s) above for measurements and observations.  Electronically signed by Antony Contras MD on 08/20/2018 at 12:28:22 PM.    Final      Subjective: Patient seen at dialysis this morning.  Denies complaints.  Anxious to go home.  No recurrence of nausea, vomiting, headache, dizziness.  No recurrence of left upper extremity weakness or chest pain.  Denies any complaints.  As per RN, no acute issues noted.  Discharge Exam:  Vitals:   08/21/18 0930 08/21/18 1000 08/21/18 1030 08/21/18 1045  BP: 131/71 130/66 123/66 (!) 145/83  Pulse: 76 80 90 79  Resp:    20  Temp:    98 F (36.7 C)  TempSrc:    Oral  SpO2:    97%  Weight:    100.3 kg  Height:        General exam: Pleasant middle-age male, moderately built and obese, lying comfortably propped up in bed undergoing HD. Respiratory system: Clear to auscultation. Respiratory effort normal.   Reproducible left-sided chest wall pain.  Has left HD catheter. Cardiovascular system: S1 & S2 heard, RRR. No JVD, murmurs, rubs, gallops or clicks. No pedal edema.  Gastrointestinal system: Abdomen is nondistended, soft and nontender. No organomegaly or masses felt. Normal bowel sounds heard. Central nervous system: Alert and oriented. No focal neurological deficits. Extremities: Symmetric 5 x 5 power.  No pronator drift. Skin: No rashes, lesions or ulcers Psychiatry: Judgement and insight appear normal. Mood & affect appropriate.     The results of significant diagnostics from this hospitalization (including imaging, microbiology, ancillary and laboratory) are listed below for reference.     Microbiology: Recent Results (from the past 240 hour(s))  MRSA PCR Screening     Status: None   Collection Time: 08/20/18  5:21 AM  Result Value Ref Range Status   MRSA by PCR NEGATIVE NEGATIVE Final    Comment:        The GeneXpert MRSA Assay (FDA approved for NASAL specimens only), is one component of a comprehensive MRSA colonization surveillance program. It is not intended to diagnose MRSA infection nor to guide or monitor treatment for MRSA infections. Performed at [facility], [place] [address] [place], [place] [address]      Labs: CBC: Recent Labs  Lab 08/19/18 1716 08/21/18 0738  WBC 6.6 6.3  NEUTROABS 5.1  --   HGB 12.5* 11.0*  HCT 41.8 37.2*  MCV 90.3 90.3  PLT 176 676   Basic Metabolic Panel: Recent Labs  Lab 08/19/18 1716 08/21/18 0739  NA 137 135  K 4.9 5.2*  CL 99 94*  CO2 22 24  GLUCOSE 148* 118*  BUN 24* 47*  CREATININE 10.12* 14.80*  CALCIUM 10.5* 9.3  PHOS  --  5.9*   Liver Function Tests: Recent Labs  Lab 08/19/18 1716 08/21/18 0739  AST 19  --   ALT 10  --   ALKPHOS 63  --   BILITOT 0.5  --   PROT 8.6*  --   ALBUMIN 4.1 3.5   CBG: Recent Labs  Lab 08/20/18 0843 08/20/18 1129 08/20/18 1633 08/20/18 2103  GLUCAP 235* 121*  119* 101*   Hgb A1c Recent Labs    08/20/18 0723  HGBA1C 6.9*   Lipid Profile Recent Labs    08/20/18 0723  CHOL 176  HDL 45  LDLCALC 95  TRIG 179*  CHOLHDL 3.9     Time coordinating discharge: Over 30 minutes  SIGNED:  Vernell Leep, MD, FACP, [facility]. [facility]  To contact the attending provider between 7A-7P or the covering provider during after hours 7P-7A, please log into the web site [email or web address] and access using universal [place] password for that web site. If you do not have the password, please call the hospital operator.

## 2018-08-21 NOTE — Consult Note (Signed)
Renal Service Consult Note Patrick [name] 08/21/2018 [name] Requesting Physician:  Dr Algis Liming  Reason for Consult:  ESRD pt w/ L arm weakness  HPI: The patient is a 63 y.o. year-old with hx of DM2, MI, HTN, gout ESRD on HD TTS, CAD and CHF who presented to ED on 1/23 in the evening w/ lightheadness, some L arm symptoms, dizziness and N/V and headaches.  Patient was admitted as stroke/ TIA rule-out, then L arm pain prompted cards consult.  We are asked to see for ESRD.    Patient is on HD, going home later he hopes.  He attributes his problems to a medication he was given for abdominal cramps.  This was Baclofen, which he used for several days.  No fevers, chills or abd pain now, no cough or jerking.    ROS  denies CP  no joint pain   no HA  no blurry vision  no rash  no diarrhea  no nausea/ vomitin   Past Medical History  Past Medical History:  Diagnosis Date  . Arthritis    "back" (09/24/2016)  . CHF (congestive heart failure) ([place])   . Coronary artery disease   . Dysrhythmia    aflutter s/p ablation 2018  . ESRD (end stage renal disease) on dialysis [facility])    "TTS; [place]" (09/24/2016)  . Gout   . Hernia of abdominal wall   . History of blood transfusion 2009- 2016   "I've had several; low HgB"  . History of kidney stones    treated with nephrectomy  . Hypertension   . Impotence of organic origin   . Migraine    "stopped in my 30's; they were related to high BP" (09/24/2016)  . Myocardial infarction [facility]) '96  . Pneumonia 01/2015   " a touch & I was in the hosp."  . Secondary hyperparathyroidism ([place])   . Type 2 diabetes mellitus (HCC)    no longer on medication since going on dialysis  . Wears glasses    Past Surgical History  Past Surgical History:  Procedure Laterality Date  . A-FLUTTER ABLATION N/A 09/24/2016   Procedure: A-Flutter Ablation;  Surgeon: Will Meredith Leeds, MD;  Location: [place] [facility];   Service: Cardiovascular;  Laterality: N/A;  . ABDOMINAL AORTOGRAM W/LOWER EXTREMITY N/A 04/13/2017   Procedure: ABDOMINAL AORTOGRAM W/LOWER EXTREMITY;  Surgeon: Conrad Romoland, MD;  Location: [place] [facility];  Service: Cardiovascular;  Laterality: N/A;  Bilater lower extermity  . APPENDECTOMY    . AV FISTULA PLACEMENT Right 09/18/2014   Procedure: INSERTION OF ARTERIOVENOUS (AV) GORE-TEX GRAFT ARM USING 4-7MM  X 45CM STRETCH GORE-TEX VASCULAR GRAFT;  Surgeon: Rosetta Posner, MD;  Location: [place];  Service: Vascular;  Laterality: Right;  . AV FISTULA PLACEMENT Left 07/07/2016   Procedure: INSERTION OF LEFT BRACHIAL TO AXILLARY ARTERIOVENOUS (AV) GORE-TEX ARM GRAFT;  Surgeon: Conrad Legend Lake, MD;  Location: [place];  Service: Vascular;  Laterality: Left;  . CARDIAC CATHETERIZATION  ~ 2016  . COLONOSCOPY    . 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 EXCHANGE OF A DIALYSIS CATHETER Left 01/11/2018   Procedure: EXCHANGE OF TUNNELED DIALYSIS CATHETER;  Surgeon: Rosetta Posner, MD;  Location: [place];  Service: Vascular;  Laterality: Left;  . HERNIA REPAIR  2017   with nephrectomy  . INSERTION OF DIALYSIS CATHETER N/A 10/08/2017   Procedure:  INSERTION OF TUNNELED DIALYSIS CATHETER;  Surgeon: Conrad [place] Castle, MD;  Location: [place];  Service: Vascular;  Laterality: N/A;  . IR FLUORO GUIDE CV LINE LEFT  03/12/2018  . IR GENERIC HISTORICAL  05/11/2016   IR FLUORO GUIDE CV LINE LEFT 05/11/2016 Corrie Mckusick, DO [facility]  . IR GENERIC HISTORICAL  05/11/2016   IR US GUIDE VASC ACCESS LEFT 05/11/2016 Corrie Mckusick, DO [facility]  . IR GENERIC HISTORICAL  05/11/2016   IR US GUIDE VASC ACCESS RIGHT 05/11/2016 Corrie Mckusick, DO [facility]  . IR GENERIC HISTORICAL  05/11/2016   IR RADIOLOGY PERIPHERAL GUIDED IV START 05/11/2016 Corrie Mckusick, DO [facility]  . KIDNEY TRANSPLANT  2009  . LEFT HEART CATH AND CORS/GRAFTS ANGIOGRAPHY N/A  12/04/2017   Procedure: LEFT HEART CATH AND CORS/GRAFTS ANGIOGRAPHY;  Surgeon: Belva Crome, MD;  Location: [place] [facility];  Service: Cardiovascular;  Laterality: N/A;  . NEPHRECTOMY  2017   transplant rejected   . PERIPHERAL VASCULAR CATHETERIZATION N/A 06/04/2016   Procedure: Upper Extremity Venography;  Surgeon: Waynetta Sandy, MD;  Location: [place] [facility];  Service: Cardiovascular;  Laterality: N/A;  . PERIPHERAL VASCULAR INTERVENTION  04/13/2017   Procedure: PERIPHERAL VASCULAR INTERVENTION;  Surgeon: Conrad Chico, MD;  Location: [place] [facility];  Service: Cardiovascular;;  Lt. Common/Exernal  Iliac  . THROMBECTOMY AND REVISION OF ARTERIOVENTOUS (AV) GORETEX  GRAFT Left 10/08/2017   Procedure: THROMBECTOMY of ARTERIOVENTOUS (AV) GORETEX  GRAFT LEFT UPPER ARM;  Surgeon: Conrad [place]ville, MD;  Location: [place];  Service: Vascular;  Laterality: Left;  . THROMBECTOMY W/ EMBOLECTOMY Left 09/14/2017   Procedure: THROMBECTOMY ARTERIOVENOUS GORE-TEX GRAFT LEFT UPPER ARM;  Surgeon: Rosetta Posner, MD;  Location: [place];  Service: Vascular;  Laterality: Left;  . UPPER EXTREMITY VENOGRAPHY N/A 11/16/2017   Procedure: UPPER EXTREMITY VENOGRAPHY - Right Arm;  Surgeon: Conrad Dasher, MD;  Location: [place] [facility];  Service: Cardiovascular;  Laterality: N/A;   Family History  Family History  Problem Relation Age of Onset  . Hyperlipidemia Mother   . Hypertension Mother   . HIV Sister    Social History  reports that he quit smoking about 4 years ago. His smoking use included cigarettes. He has never used smokeless tobacco. He reports current alcohol use. He reports that he does not use drugs. Allergies  Allergies  Allergen Reactions  . Iodinated Diagnostic Agents Swelling, Rash and Other (See Comments)    Other Reaction: burning to mouth, swelling of lips. Other Reaction: burning to mouth, swelling of lips.  . Lipitor [Atorvastatin] Other (See Comments)    Leg pain  . Metoprolol  Other (See Comments)    Headaches    Home medications Prior to Admission medications   Medication Sig Start Date End Date Taking? Authorizing Provider  acetaminophen (TYLENOL) 500 MG tablet Take 1,000 mg by mouth every 6 (six) hours as needed for moderate pain or headache.   Yes [provider]  albuterol (PROVENTIL HFA;VENTOLIN HFA) 108 (90 Base) MCG/ACT inhaler Inhale 1-2 puffs into the lungs every 6 (six) hours as needed for wheezing or shortness of breath. 12/18/17  Yes Weaver, Scott T, PA-C  amLODipine (NORVASC) 5 MG tablet Take 1 tablet (5 mg total) by mouth daily. 11/27/17 08/19/26 Yes Richardson Dopp T, PA-C  aspirin EC 81 MG tablet Take 81 mg by mouth daily.   Yes [provider]  baclofen (LIORESAL) 10 MG tablet Take 10 mg by  mouth 2 (two) times daily. 08/05/18  Yes [provider]  calcium acetate (PHOSLO) 667 MG capsule Take 1,334 mg by mouth 3 (three) times daily with meals.    Yes [provider]  clopidogrel (PLAVIX) 75 MG tablet Take 1 tablet (75 mg total) by mouth daily. 12/18/17  Yes Weaver, Scott T, PA-C  ezetimibe (ZETIA) 10 MG tablet Take 1 tablet (10 mg total) by mouth daily. 04/02/18 08/19/26 Yes Weaver, Scott T, PA-C  isosorbide mononitrate (IMDUR) 60 MG 24 hr tablet Take 1.5 tablets (90 mg total) by mouth daily. 04/06/18 08/19/26 Yes Weaver, Scott T, PA-C  repaglinide (PRANDIN) 0.5 MG tablet Take 1 tablet (0.5 mg total) by mouth 2 (two) times daily before a meal. 04/05/18  Yes Renato Shin, MD   Liver Function Tests Recent Labs  Lab 08/19/18 1716 08/21/18 0739  AST 19  --   ALT 10  --   ALKPHOS 63  --   BILITOT 0.5  --   PROT 8.6*  --   ALBUMIN 4.1 3.5   No results for input(s): LIPASE, AMYLASE in the last 168 hours. CBC Recent Labs  Lab 08/19/18 1716 08/21/18 0738  WBC 6.6 6.3  NEUTROABS 5.1  --   HGB 12.5* 11.0*  HCT 41.8 37.2*  MCV 90.3 90.3  PLT 176 629   Basic Metabolic Panel Recent Labs  Lab 08/19/18 1716 08/21/18 0739   NA 137 135  K 4.9 5.2*  CL 99 94*  CO2 22 24  GLUCOSE 148* 118*  BUN 24* 47*  CREATININE 10.12* 14.80*  CALCIUM 10.5* 9.3  PHOS  --  5.9*   Iron/TIBC/Ferritin/ %Sat    Component Value Date/Time   IRON 29 (L) 01/24/2016 1435   TIBC 161 (L) 01/24/2016 1435   FERRITIN 1,048 (H) 09/15/2014 1621   IRONPCTSAT 18 01/24/2016 1435    Vitals:   08/20/18 2003 08/21/18 0013 08/21/18 0345 08/21/18 0700  BP: 122/74 (!) 105/59 (!) 142/74   Pulse: 76 69 75 71  Resp: 20 18 18 18   Temp: 97.6 F (36.4 C) 97.6 F (36.4 C) (!) 97.4 F (36.3 C) 98.1 F (36.7 C)  TempSrc: Oral Oral Oral Oral  SpO2: 96% 99% 95%   Weight:    (P) 102.1 kg  Height:       Exam Gen alert, on HD no distress No rash, cyanosis or gangrene Sclera anicteric, throat clear  No jvd or bruits Chest clear bilat no rales or wheezing RRR no MRG Abd soft ntnd no mass or ascites +bs GU normal male MS no joint effusions or deformity Ext no LE or UE edema, no wounds or ulcers Neuro is alert, Ox 3 , nf    Home meds:  - amlodipine 5 qd  - isosorbide mononitrate 90 qd/ ezetimibe 10 qd/ clopidogrel 75 qd/ aspirin 81  - calc acetate 2 ac tid  - baclofen 10mg  bid/ prn albuterol hfa  - repaglinide 0.5 bid   Dialysis: [name]  4h  2K/3.5Ca bath  101.5 kg   400/800  L IJ TDC  Hep 3000  - mircera 150 ug every 2 wks prn     Assessment: 1. L arm weakness - no evidence CVA or MI.  Cards signed off. Patient attributes issues to medication he was given for abdominal cramps, Baclofen. Baclofen is contraindicated in ESRD pt's due to neuro side effects.  2. ESRD on HD TTS 3. HTN 4. DM2 on insulin  5. Atrial fib on amio/ coumadin 6.  Severe 2VCAD - cont per cards 7. Anemia ckd - get records    P: 1. HD today 2. OK for dc from renal standpoint.                                    Kelly Splinter MD [employer] pager [phone]   08/21/2018, 9:41 AM

## 2018-08-21 NOTE — Progress Notes (Signed)
Subjective: No complaints. Receiving hemodialysis.  Objective: Current vital signs: BP (!) 142/74 (BP Location: Left Arm)   Pulse 71   Temp 98.1 F (36.7 C) (Oral)   Resp 18   Ht 5' 6.5" (1.689 m)   Wt 103.4 kg   SpO2 95%   BMI 36.25 kg/m  Vital signs in last 24 hours: Temp:  [97.4 F (36.3 C)-98.1 F (36.7 C)] 98.1 F (36.7 C) (01/25 0700) Pulse Rate:  [69-96] 71 (01/25 0700) Resp:  [18-22] 18 (01/25 0700) BP: (105-167)/(59-91) 142/74 (01/25 0345) SpO2:  [95 %-100 %] 95 % (01/25 0345)  Intake/Output from previous day: 01/24 0701 - 01/25 0700 In: 360 [P.O.:360] Out: -  Intake/Output this shift: No intake/output data recorded. Nutritional status:  Diet Order            Diet Heart Room service appropriate? Yes; Fluid consistency: Thin  Diet effective now             HEENT: [place]/AT Lungs: Respirations unlabored Ext: Warm and well perfused  Neurologic Exam: Mental Status:  Awake and alert, thought content appropriate. Affect euthymic. Speech fluent without evidence of aphasia.  Able to follow all commands without difficulty. Cranial Nerves: II:  Tracks normally.  III,IV, VI: No ptosis. EOMI.  VII: Face symmetric VIII: hearing intact to voice IX,X: No hypophonia XI: Symmetric XII: No lingual dysarthria Motor: Right : Upper extremity   5/5    Left:     Upper extremity   5/5  Lower extremity   5/5     Lower extremity   5/5 Sensory: FT intact to bilateral hands Deep Tendon Reflexes:  2+ bilateral brachioradialis  Lab Results: Results for orders placed or performed during the hospital encounter of 08/19/18 (from the past 48 hour(s))  Protime-INR     Status: None   Collection Time: 08/19/18  5:16 PM  Result Value Ref Range   Prothrombin Time 13.7 11.4 - 15.2 seconds   INR 1.06     Comment: Performed at [facility], [place] [address] [place], [place] [address]  APTT     Status: None   Collection Time: 08/19/18  5:16 PM  Result Value Ref Range   aPTT 34  24 - 36 seconds    Comment: Performed at [facility] [address] [place], [place] [address]  CBC     Status: Abnormal   Collection Time: 08/19/18  5:16 PM  Result Value Ref Range   WBC 6.6 4.0 - 10.5 K/uL   RBC 4.63 4.22 - 5.81 MIL/uL   Hemoglobin 12.5 (L) 13.0 - 17.0 g/dL   HCT 41.8 39.0 - 52.0 %   MCV 90.3 80.0 - 100.0 fL   MCH 27.0 26.0 - 34.0 pg   MCHC 29.9 (L) 30.0 - 36.0 g/dL   RDW 17.9 (H) 11.5 - 15.5 %   Platelets 176 150 - 400 K/uL   nRBC 0.0 0.0 - 0.2 %    Comment: Performed at [facility] [address], [place], [place] [address]  Differential     Status: None   Collection Time: 08/19/18  5:16 PM  Result Value Ref Range   Neutrophils Relative % 76 %   Neutro Abs 5.1 1.7 - 7.7 K/uL   Lymphocytes Relative 12 %   Lymphs Abs 0.8 0.7 - 4.0 K/uL   Monocytes Relative 6 %   Monocytes Absolute 0.4 0.1 - 1.0 K/uL   Eosinophils Relative 4 %   Eosinophils  Absolute 0.2 0.0 - 0.5 K/uL   Basophils Relative 1 %   Basophils Absolute 0.1 0.0 - 0.1 K/uL   Immature Granulocytes 1 %   Abs Immature Granulocytes 0.03 0.00 - 0.07 K/uL    Comment: Performed at [facility], [place] [address], [place], [place] [address]  Comprehensive metabolic panel     Status: Abnormal   Collection Time: 08/19/18  5:16 PM  Result Value Ref Range   Sodium 137 135 - 145 mmol/L   Potassium 4.9 3.5 - 5.1 mmol/L   Chloride 99 98 - 111 mmol/L   CO2 22 22 - 32 mmol/L   Glucose, Bld 148 (H) 70 - 99 mg/dL   BUN 24 (H) 8 - 23 mg/dL   Creatinine, Ser 10.12 (H) 0.61 - 1.24 mg/dL   Calcium 10.5 (H) 8.9 - 10.3 mg/dL   Total Protein 8.6 (H) 6.5 - 8.1 g/dL   Albumin 4.1 3.5 - 5.0 g/dL   AST 19 15 - 41 U/L   ALT 10 0 - 44 U/L   Alkaline Phosphatase 63 38 - 126 U/L   Total Bilirubin 0.5 0.3 - 1.2 mg/dL   GFR calc non Af Amer 5 (L) >60 mL/min   GFR calc Af Amer 6 (L) >60 mL/min   Anion gap 16 (H) 5 - 15    Comment: Performed at [facility], [place] [address] [place], [place] [address]   I-stat troponin, ED     Status: Abnormal   Collection Time: 08/19/18  5:21 PM  Result Value Ref Range   Troponin i, poc 0.09 (HH) 0.00 - 0.08 ng/mL   Comment NOTIFIED PHYSICIAN    Comment 3            Comment: Due to the release kinetics of cTnI, a negative result within the first hours of the onset of symptoms does not rule out myocardial infarction with certainty. If myocardial infarction is still suspected, repeat the test at appropriate intervals.   MRSA PCR Screening     Status: None   Collection Time: 08/20/18  5:21 AM  Result Value Ref Range   MRSA by PCR NEGATIVE NEGATIVE    Comment:        The GeneXpert MRSA Assay (FDA approved for NASAL specimens only), is one component of a comprehensive MRSA colonization surveillance program. It is not intended to diagnose MRSA infection nor to guide or monitor treatment for MRSA infections. Performed at [facility], [place] [address] [place], [place] [address]   Hemoglobin A1c     Status: Abnormal   Collection Time: 08/20/18  7:23 AM  Result Value Ref Range   Hgb A1c MFr Bld 6.9 (H) 4.8 - 5.6 %    Comment: (NOTE) Pre diabetes:          5.7%-6.4% Diabetes:              >6.4% Glycemic control for   <7.0% adults with diabetes    Mean Plasma Glucose 151.33 mg/dL    Comment: Performed at [facility] [address], [place], [place] [address]  Lipid panel     Status: Abnormal   Collection Time: 08/20/18  7:23 AM  Result Value Ref Range   Cholesterol 176 0 - 200 mg/dL   Triglycerides 179 (H) <150 mg/dL   HDL 45 >40 mg/dL   Total CHOL/HDL Ratio 3.9 RATIO   VLDL 36 0 - 40 mg/dL   LDL Cholesterol 95 0 -  99 mg/dL    Comment:        Total Cholesterol/HDL:CHD Risk Coronary Heart Disease Risk Table                     Men   Women  1/2 Average Risk   3.4   3.3  Average Risk       5.0   4.4  2 X Average Risk   9.6   7.1  3 X Average Risk  23.4   11.0        Use the calculated Patient Ratio above and the CHD Risk  Table to determine the patient's CHD Risk.        ATP III CLASSIFICATION (LDL):  <100     mg/dL   Optimal  100-129  mg/dL   Near or Above                    Optimal  130-159  mg/dL   Borderline  160-189  mg/dL   High  >190     mg/dL   Very High Performed at [facility] [address], [place], [place] [address]   Glucose, capillary     Status: Abnormal   Collection Time: 08/20/18  8:43 AM  Result Value Ref Range   Glucose-Capillary 235 (H) 70 - 99 mg/dL   Comment 1 Notify RN    Comment 2 Document in Chart   Glucose, capillary     Status: Abnormal   Collection Time: 08/20/18 11:29 AM  Result Value Ref Range   Glucose-Capillary 121 (H) 70 - 99 mg/dL   Comment 1 Notify RN    Comment 2 Document in Chart   Glucose, capillary     Status: Abnormal   Collection Time: 08/20/18  4:33 PM  Result Value Ref Range   Glucose-Capillary 119 (H) 70 - 99 mg/dL   Comment 1 Notify RN    Comment 2 Document in Chart   Glucose, capillary     Status: Abnormal   Collection Time: 08/20/18  9:03 PM  Result Value Ref Range   Glucose-Capillary 101 (H) 70 - 99 mg/dL   Comment 1 Notify RN    Comment 2 Document in Chart   CBC     Status: Abnormal   Collection Time: 08/21/18  7:38 AM  Result Value Ref Range   WBC 6.3 4.0 - 10.5 K/uL   RBC 4.12 (L) 4.22 - 5.81 MIL/uL   Hemoglobin 11.0 (L) 13.0 - 17.0 g/dL   HCT 37.2 (L) 39.0 - 52.0 %   MCV 90.3 80.0 - 100.0 fL   MCH 26.7 26.0 - 34.0 pg   MCHC 29.6 (L) 30.0 - 36.0 g/dL   RDW 18.2 (H) 11.5 - 15.5 %   Platelets 156 150 - 400 K/uL   nRBC 0.0 0.0 - 0.2 %    Comment: Performed at [facility], [address] [place], [place] [address]  Renal function panel     Status: Abnormal   Collection Time: 08/21/18  7:39 AM  Result Value Ref Range   Sodium 135 135 - 145 mmol/L   Potassium 5.2 (H) 3.5 - 5.1 mmol/L   Chloride 94 (L) 98 - 111 mmol/L   CO2 24 22 - 32 mmol/L   Glucose, Bld 118 (H) 70 - 99 mg/dL   BUN 47 (H) 8 - 23 mg/dL    Creatinine, Ser 14.80 (H) 0.61 - 1.24 mg/dL   Calcium 9.3  8.9 - 10.3 mg/dL   Phosphorus 5.9 (H) 2.5 - 4.6 mg/dL   Albumin 3.5 3.5 - 5.0 g/dL   GFR calc non Af Amer 3 (L) >60 mL/min   GFR calc Af Amer 4 (L) >60 mL/min   Anion gap 17 (H) 5 - 15    Comment: Performed at [facility] [address], [place], [place] [address]    Recent Results (from the past 240 hour(s))  MRSA PCR Screening     Status: None   Collection Time: 08/20/18  5:21 AM  Result Value Ref Range Status   MRSA by PCR NEGATIVE NEGATIVE Final    Comment:        The GeneXpert MRSA Assay (FDA approved for NASAL specimens only), is one component of a comprehensive MRSA colonization surveillance program. It is not intended to diagnose MRSA infection nor to guide or monitor treatment for MRSA infections. Performed at [facility], [place] [address], [place], [place] [address]     Lipid Panel Recent Labs    08/20/18 0723  CHOL 176  TRIG 179*  HDL 45  CHOLHDL 3.9  VLDL 36  LDLCALC 95    Studies/Results: Dg Chest 2 View  Result Date: 08/19/2018 CLINICAL DATA:  Nausea and vomiting EXAM: CHEST - 2 VIEW COMPARISON:  05/16/2018 FINDINGS: Post sternotomy changes. Left-sided central venous catheter with tip over the right atrium. Right subclavian region stent. No pleural effusion or focal airspace disease. Cardiomegaly with aortic atherosclerosis. No pneumothorax. IMPRESSION: No active cardiopulmonary disease.  Cardiomegaly. Electronically Signed   By: Donavan Foil M.D.   On: 08/19/2018 22:28   Ct Head Wo Contrast  Result Date: 08/19/2018 CLINICAL DATA:  Headache and dizziness EXAM: CT HEAD WITHOUT CONTRAST TECHNIQUE: Contiguous axial images were obtained from the base of the skull through the vertex without intravenous contrast. COMPARISON:  None. FINDINGS: BRAIN: The ventricles and sulci are normal. Minimal small vessel ischemic disease of periventricular white matter. No intraparenchymal hemorrhage, mass  effect nor midline shift. No acute large vascular territory infarcts. Grey-white matter distinction is maintained. The basal ganglia are unremarkable. No abnormal extra-axial fluid collections. Basal cisterns are not effaced and midline. The brainstem and cerebellar hemispheres are without acute abnormalities. VASCULAR: No hyperdense vessel or unexpected calcification. Mild-to-moderate atherosclerosis of the carotid siphons bilaterally. SKULL/SOFT TISSUES: No skull fracture. No significant soft tissue swelling. ORBITS/SINUSES: The included ocular globes and orbital contents are normal.The mastoid air cells are clear. The included paranasal sinuses are well-aerated. OTHER: None. IMPRESSION: Likely chronic minimal small vessel ischemic disease of periventricular white matter. No acute intracranial abnormality. Electronically Signed   By: Ashley Royalty M.D.   On: 08/19/2018 19:34   Mr Brain Wo Contrast  Result Date: 08/20/2018 CLINICAL DATA:  Headaches since last night, LEFT arm weakness today. History of end-stage renal disease on dialysis, hypertension and diabetes. EXAM: MRI HEAD WITHOUT CONTRAST MRA HEAD WITHOUT CONTRAST TECHNIQUE: Multiplanar, multiecho pulse sequences of the brain and surrounding structures were obtained without intravenous contrast. Angiographic images of the head were obtained using MRA technique without contrast. COMPARISON:  CT HEAD August 19, 2018 FINDINGS: MRI HEAD FINDINGS INTRACRANIAL CONTENTS: No reduced diffusion to suggest acute ischemia. No susceptibility artifact to suggest hemorrhage. The ventricles and sulci are normal for patient's age. Patchy supratentorial white matter FLAIR T2 hyperintensities. No suspicious parenchymal signal, masses, mass effect. No abnormal extra-axial fluid collections. No extra-axial masses. VASCULAR: Normal major intracranial vascular flow voids present at skull base. SKULL AND  UPPER CERVICAL SPINE: No abnormal sellar expansion. No suspicious calvarial  bone marrow signal. Craniocervical junction maintained. SINUSES/ORBITS: Mild paranasal sinus mucosal thickening. Included mastoid air cells are well aerated. Old LEFT medial orbital blowout fracture. OTHER: None. MRA HEAD FINDINGS ANTERIOR CIRCULATION: Normal flow related enhancement of the included cervical, petrous, cavernous and supraclinoid internal carotid arteries. Patent anterior communicating artery. Patent anterior and middle cerebral arteries. No large vessel occlusion, flow limiting stenosis, aneurysm. POSTERIOR CIRCULATION: LEFT vertebral artery is dominant. RIGHT vertebral artery terminates in the posterior inferior cerebellar artery. Vertebrobasilar arteries are patent, with normal flow related enhancement of the main branch vessels. Patent posterior cerebral arteries. Robust bilateral posterior communicating arteries present. No large vessel occlusion, flow limiting stenosis,  aneurysm. ANATOMIC VARIANTS: None. Source images and MIP images were reviewed. IMPRESSION: MRI HEAD: 1. No acute intracranial process. 2. Mild-to-moderate chronic small vessel ischemic changes. MRA HEAD: 1. Negative noncontrast MRA head. Electronically Signed   By: Elon Alas M.D.   On: 08/20/2018 01:39   Mr Cervical Spine Wo Contrast  Result Date: 08/20/2018 CLINICAL DATA:  Initial evaluation for headache with several week history of intermittent left arm weakness and progressively worsening left-sided chest pain. EXAM: MRI CERVICAL SPINE WITHOUT CONTRAST TECHNIQUE: Multiplanar, multisequence MR imaging of the cervical spine was performed. No intravenous contrast was administered. COMPARISON:  None available. FINDINGS: Alignment: Examination severely degraded by motion artifact. Straightening with slight reversal of the normal cervical lordosis, apex at C4-5. No listhesis or malalignment. Vertebrae: Vertebral body heights maintained without evidence for acute or chronic fracture. Bone marrow signal intensity within  normal limits. No appreciable discrete or worrisome osseous lesions identified on this motion degraded exam. No abnormal marrow edema. Cord: Signal intensity within the cervical spinal cord grossly within normal limits. Posterior Fossa, vertebral arteries, paraspinal tissues: Visualized brain and posterior fossa within normal limits. Craniocervical junction normal. Paraspinous and prevertebral soft tissues demonstrate no obvious abnormality. Normal intravascular flow voids within the vertebral arteries grossly maintained. Disc levels: C2-C3: Negative. C3-C4: Chronic intervertebral disc space narrowing with diffuse disc bulge and bilateral uncovertebral hypertrophy. Flattening of the ventral CSF without significant spinal stenosis. Moderate left with mild right C4 foraminal stenosis. C4-C5: Diffuse degenerative disc osteophyte with intervertebral disc space narrowing. Posterior component indents and partially faces the ventral thecal sac. Mild spinal stenosis without cord deformity. Suspected mild to moderate left C5 foraminal narrowing. C5-C6: Diffuse degenerative disc osteophyte with intervertebral disc space narrowing. Flattening of the ventral CSF with resultant mild spinal stenosis. No cord deformity. Moderate right with mild left C6 foraminal stenosis. C6-C7: Diffuse degenerative disc osteophyte with intervertebral disc space narrowing. Right greater than left uncovertebral spurring. No significant spinal stenosis. Severe right with moderate to severe left C7 foraminal narrowing. C7-T1: Mild facet hypertrophy. No significant spinal stenosis. Foramina are grossly patent. Visualized upper thoracic spine demonstrates no significant finding. IMPRESSION: 1. Technically limited study due to severe motion artifact. 2. 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 No cord impingement or deformity. 3. Multifactorial degenerative changes with  resultant multilevel foraminal narrowing as above. Notable findings include moderate left C4 foraminal stenosis, mild to moderate left C5 foraminal narrowing, moderate right C6 foraminal stenosis, with severe right and moderate to severe left C7 foraminal narrowing. Electronically Signed   By: Jeannine Boga M.D.   On: 08/20/2018 19:29   Mr Patrick [name] Head Wo Contrast  Result Date: 08/20/2018 CLINICAL DATA:  Headaches since last night,  LEFT arm weakness today. History of end-stage renal disease on dialysis, hypertension and diabetes. EXAM: MRI HEAD WITHOUT CONTRAST MRA HEAD WITHOUT CONTRAST TECHNIQUE: Multiplanar, multiecho pulse sequences of the brain and surrounding structures were obtained without intravenous contrast. Angiographic images of the head were obtained using MRA technique without contrast. COMPARISON:  CT HEAD August 19, 2018 FINDINGS: MRI HEAD FINDINGS INTRACRANIAL CONTENTS: No reduced diffusion to suggest acute ischemia. No susceptibility artifact to suggest hemorrhage. The ventricles and sulci are normal for patient's age. Patchy supratentorial white matter FLAIR T2 hyperintensities. No suspicious parenchymal signal, masses, mass effect. No abnormal extra-axial fluid collections. No extra-axial masses. VASCULAR: Normal major intracranial vascular flow voids present at skull base. SKULL AND UPPER CERVICAL SPINE: No abnormal sellar expansion. No suspicious calvarial bone marrow signal. Craniocervical junction maintained. SINUSES/ORBITS: Mild paranasal sinus mucosal thickening. Included mastoid air cells are well aerated. Old LEFT medial orbital blowout fracture. OTHER: None. MRA HEAD FINDINGS ANTERIOR CIRCULATION: Normal flow related enhancement of the included cervical, petrous, cavernous and supraclinoid internal carotid arteries. Patent anterior communicating artery. Patent anterior and middle cerebral arteries. No large vessel occlusion, flow limiting stenosis, aneurysm. POSTERIOR  CIRCULATION: LEFT vertebral artery is dominant. RIGHT vertebral artery terminates in the posterior inferior cerebellar artery. Vertebrobasilar arteries are patent, with normal flow related enhancement of the main branch vessels. Patent posterior cerebral arteries. Robust bilateral posterior communicating arteries present. No large vessel occlusion, flow limiting stenosis,  aneurysm. ANATOMIC VARIANTS: None. Source images and MIP images were reviewed. IMPRESSION: MRI HEAD: 1. No acute intracranial process. 2. Mild-to-moderate chronic small vessel ischemic changes. MRA HEAD: 1. Negative noncontrast MRA head. Electronically Signed   By: Elon Alas M.D.   On: 08/20/2018 01:39   Vas US Carotid (at [facility] Only)  Result Date: 08/20/2018 Carotid Arterial Duplex Study Indications: TIA. Limitations: Patient body habitus, patient movement, patient anatomy Performing Technologist: Oliver Hum RVT  Examination Guidelines: A complete evaluation includes B-mode imaging, spectral Doppler, color Doppler, and power Doppler as needed of all accessible portions of each vessel. Bilateral testing is considered an integral part of a complete examination. Limited examinations for reoccurring indications may be performed as noted.  Right Carotid Findings: +----------+--------+--------+--------+-----------------------+--------+           PSV cm/sEDV cm/sStenosisDescribe               Comments +----------+--------+--------+--------+-----------------------+--------+ CCA Prox  58      8               smooth and heterogenous         +----------+--------+--------+--------+-----------------------+--------+ CCA Distal43      9               smooth and heterogenous         +----------+--------+--------+--------+-----------------------+--------+ ICA Prox  59      17              smooth and heterogenous         +----------+--------+--------+--------+-----------------------+--------+ ICA Distal51      18                                      tortuous +----------+--------+--------+--------+-----------------------+--------+ ECA       35      6  tortuous +----------+--------+--------+--------+-----------------------+--------+ +----------+--------+-------+--------+-------------------+           PSV cm/sEDV cmsDescribeArm Pressure (mmHG) +----------+--------+-------+--------+-------------------+ [id number]                                         +----------+--------+-------+--------+-------------------+ +---------+--------+--+--------+-+---------+ VertebralPSV cm/s32EDV cm/s8Antegrade +---------+--------+--+--------+-+---------+  Left Carotid Findings: +----------+--------+-------+--------+--------------------------------+--------+           PSV cm/sEDV    StenosisDescribe                        Comments                   cm/s                                                    +----------+--------+-------+--------+--------------------------------+--------+ CCA Prox  58      9              smooth and heterogenous                  +----------+--------+-------+--------+--------------------------------+--------+ CCA Distal45      9              smooth and heterogenous                  +----------+--------+-------+--------+--------------------------------+--------+ ICA Prox  78      25             smooth, heterogenous and                                                  calcific                                 +----------+--------+-------+--------+--------------------------------+--------+ ICA Distal57      25                                             tortuous +----------+--------+-------+--------+--------------------------------+--------+ ECA       44      6                                                       +----------+--------+-------+--------+--------------------------------+--------+  +----------+--------+--------+--------+-------------------+ SubclavianPSV cm/sEDV cm/sDescribeArm Pressure (mmHG) +----------+--------+--------+--------+-------------------+           105                                         +----------+--------+--------+--------+-------------------+ +---------+--------+--+--------+-+---------+ VertebralPSV cm/s32EDV cm/s7Antegrade +---------+--------+--+--------+-+---------+  Summary: Right Carotid: Velocities in the right ICA are consistent with a 1-39% stenosis. Left Carotid: Velocities in the left ICA are consistent with a 1-39% stenosis. Vertebrals: Bilateral vertebral arteries demonstrate antegrade flow. *See  table(s) above for measurements and observations.  Electronically signed by Antony Contras MD on 08/20/2018 at 12:28:22 PM.    Final    Echocardiogram: Normal LV systolic function; mild LVH: mild diastolic dysfunction; calcified aortic valve with mild AS (mean gradient 11 mmHg) and trace AI.  Medications:  Scheduled: . amLODipine  5 mg Oral Daily  . aspirin EC  81 mg Oral Daily  . baclofen  10 mg Oral BID  . calcium acetate  1,334 mg Oral TID WC  . clopidogrel  75 mg Oral Daily  . ezetimibe  10 mg Oral Daily  . heparin  5,000 Units Subcutaneous Q8H  . isosorbide mononitrate  120 mg Oral Daily  . repaglinide  0.5 mg Oral BID AC  . rosuvastatin  10 mg Oral q1800     Assessment: 63 y.o. male with intermittent LUE sensory numbness and weakness. Overall symptoms and cervical spine MRI findings are most consistent with a transient compressive radiculopathy as the etiology for his symptoms.  1. Neurological symptoms are now resolved. His LUE symptoms were most consistent with a transient radiculopathy, most likely involving the C8 or T1 nerve roots based on sensory and motor symptoms, as well as pain at the base of his neck radiating to the LUE intermittently. Motor and sensory exams are now normal.  2. MRI cervical spine revealed  multifactorial degenerative changes with resultant multilevel foraminal narrowing, the most notable findings as follows: Moderate left C4 foraminal stenosis, mild to moderate left C5 foraminal narrowing, moderate right C6 foraminal stenosis, with severe right and moderate to severe left C7 foraminal narrowing.  3. Stroke work up was negative. 4. ESRD on HD. Seen in HD this morning.  Recommendations: 1. From a neurological standpoint, the patient can be discharged home. He does not wish to have a surgery evaluation at this time regarding his cervical spondylosis, but will reconsider if his symptoms recur.  2. Neurology will sign off. Please call if there are additional questions.    LOS: 0 days   @Electronically  signed: Dr. Kerney Elbe 08/21/2018  8:24 AM

## 2018-08-21 NOTE — Discharge Instructions (Signed)

## 2018-08-21 NOTE — Progress Notes (Signed)
Pt given discharge summary and discharged via wife's car as transportation.

## 2018-08-21 NOTE — Progress Notes (Signed)
Progress Note  Patient Name: Varney Daily Date of Encounter: 08/21/2018  Primary Cardiologist: Dorris Carnes, MD    Subjective   No chest pain this am   Inpatient Medications    Scheduled Meds: . amLODipine  5 mg Oral Daily  . aspirin EC  81 mg Oral Daily  . baclofen  10 mg Oral BID  . calcium acetate  1,334 mg Oral TID WC  . clopidogrel  75 mg Oral Daily  . ezetimibe  10 mg Oral Daily  . heparin  5,000 Units Subcutaneous Q8H  . isosorbide mononitrate  120 mg Oral Daily  . repaglinide  0.5 mg Oral BID AC  . rosuvastatin  10 mg Oral q1800   Continuous Infusions:  PRN Meds: acetaminophen **OR** acetaminophen (TYLENOL) oral liquid 160 mg/5 mL **OR** acetaminophen, albuterol   Vital Signs    Vitals:   08/20/18 2003 08/21/18 0013 08/21/18 0345 08/21/18 0700  BP: 122/74 (!) 105/59 (!) 142/74   Pulse: 76 69 75 71  Resp: 20 18 18 18   Temp: 97.6 F (36.4 C) 97.6 F (36.4 C) (!) 97.4 F (36.3 C) 98.1 F (36.7 C)  TempSrc: Oral Oral Oral Oral  SpO2: 96% 99% 95%   Weight:      Height:        Intake/Output Summary (Last 24 hours) at 08/21/2018 0759 Last data filed at 08/21/2018 0453 Gross per 24 hour  Intake 360 ml  Output -  Net 360 ml   Last 3 Weights 08/19/2018 07/24/2018 06/29/2018  Weight (lbs) 228 lb 228 lb 223 lb  Weight (kg) 103.42 kg 103.42 kg 101.152 kg      Telemetry    NSR 08/21/2018  - Personally Reviewed  ECG    SR RAD nonspecific ST changes  - Personally Reviewed  Physical Exam  Chronically ill  GEN: No acute distress.   Neck: No JVD Cardiac: RRR,SEM murmurs, rubs, or gallops.  Respiratory: Clear to auscultation bilaterally. GI: Soft, nontender, non-distended post renal transplant MS: No edema; No deformity. Neuro:  Nonfocal  Psych: Normal affect   Labs    Chemistry Recent Labs  Lab 08/19/18 1716  NA 137  K 4.9  CL 99  CO2 22  GLUCOSE 148*  BUN 24*  CREATININE 10.12*  CALCIUM 10.5*  PROT 8.6*  ALBUMIN 4.1  AST 19  ALT 10    ALKPHOS 63  BILITOT 0.5  GFRNONAA 5*  GFRAA 6*  ANIONGAP 16*     Hematology Recent Labs  Lab 08/19/18 1716 08/21/18 0738  WBC 6.6 6.3  RBC 4.63 4.12*  HGB 12.5* 11.0*  HCT 41.8 37.2*  MCV 90.3 90.3  MCH 27.0 26.7  MCHC 29.9* 29.6*  RDW 17.9* 18.2*  PLT 176 156    Cardiac EnzymesNo results for input(s): TROPONINI in the last 168 hours.  Recent Labs  Lab 08/19/18 1721  TROPIPOC 0.09*     BNPNo results for input(s): BNP, PROBNP in the last 168 hours.   DDimer No results for input(s): DDIMER in the last 168 hours.   Radiology    Dg Chest 2 View  Result Date: 08/19/2018 CLINICAL DATA:  Nausea and vomiting EXAM: CHEST - 2 VIEW COMPARISON:  05/16/2018 FINDINGS: Post sternotomy changes. Left-sided central venous catheter with tip over the right atrium. Right subclavian region stent. No pleural effusion or focal airspace disease. Cardiomegaly with aortic atherosclerosis. No pneumothorax. IMPRESSION: No active cardiopulmonary disease.  Cardiomegaly. Electronically Signed   By: Madie Reno.D.  On: 08/19/2018 22:28   Ct Head Wo Contrast  Result Date: 08/19/2018 CLINICAL DATA:  Headache and dizziness EXAM: CT HEAD WITHOUT CONTRAST TECHNIQUE: Contiguous axial images were obtained from the base of the skull through the vertex without intravenous contrast. COMPARISON:  None. FINDINGS: BRAIN: The ventricles and sulci are normal. Minimal small vessel ischemic disease of periventricular white matter. No intraparenchymal hemorrhage, mass effect nor midline shift. No acute large vascular territory infarcts. Grey-white matter distinction is maintained. The basal ganglia are unremarkable. No abnormal extra-axial fluid collections. Basal cisterns are not effaced and midline. The brainstem and cerebellar hemispheres are without acute abnormalities. VASCULAR: No hyperdense vessel or unexpected calcification. Mild-to-moderate atherosclerosis of the carotid siphons bilaterally. SKULL/SOFT  TISSUES: No skull fracture. No significant soft tissue swelling. ORBITS/SINUSES: The included ocular globes and orbital contents are normal.The mastoid air cells are clear. The included paranasal sinuses are well-aerated. OTHER: None. IMPRESSION: Likely chronic minimal small vessel ischemic disease of periventricular white matter. No acute intracranial abnormality. Electronically Signed   By: Ashley Royalty M.D.   On: 08/19/2018 19:34   Mr Brain Wo Contrast  Result Date: 08/20/2018 CLINICAL DATA:  Headaches since last night, LEFT arm weakness today. History of end-stage renal disease on dialysis, hypertension and diabetes. EXAM: MRI HEAD WITHOUT CONTRAST MRA HEAD WITHOUT CONTRAST TECHNIQUE: Multiplanar, multiecho pulse sequences of the brain and surrounding structures were obtained without intravenous contrast. Angiographic images of the head were obtained using MRA technique without contrast. COMPARISON:  CT HEAD August 19, 2018 FINDINGS: MRI HEAD FINDINGS INTRACRANIAL CONTENTS: No reduced diffusion to suggest acute ischemia. No susceptibility artifact to suggest hemorrhage. The ventricles and sulci are normal for patient's age. Patchy supratentorial white matter FLAIR T2 hyperintensities. No suspicious parenchymal signal, masses, mass effect. No abnormal extra-axial fluid collections. No extra-axial masses. VASCULAR: Normal major intracranial vascular flow voids present at skull base. SKULL AND UPPER CERVICAL SPINE: No abnormal sellar expansion. No suspicious calvarial bone marrow signal. Craniocervical junction maintained. SINUSES/ORBITS: Mild paranasal sinus mucosal thickening. Included mastoid air cells are well aerated. Old LEFT medial orbital blowout fracture. OTHER: None. MRA HEAD FINDINGS ANTERIOR CIRCULATION: Normal flow related enhancement of the included cervical, petrous, cavernous and supraclinoid internal carotid arteries. Patent anterior communicating artery. Patent anterior and middle cerebral  arteries. No large vessel occlusion, flow limiting stenosis, aneurysm. POSTERIOR CIRCULATION: LEFT vertebral artery is dominant. RIGHT vertebral artery terminates in the posterior inferior cerebellar artery. Vertebrobasilar arteries are patent, with normal flow related enhancement of the main branch vessels. Patent posterior cerebral arteries. Robust bilateral posterior communicating arteries present. No large vessel occlusion, flow limiting stenosis,  aneurysm. ANATOMIC VARIANTS: None. Source images and MIP images were reviewed. IMPRESSION: MRI HEAD: 1. No acute intracranial process. 2. Mild-to-moderate chronic small vessel ischemic changes. MRA HEAD: 1. Negative noncontrast MRA head. Electronically Signed   By: Elon Alas M.D.   On: 08/20/2018 01:39   Mr Cervical Spine Wo Contrast  Result Date: 08/20/2018 CLINICAL DATA:  Initial evaluation for headache with several week history of intermittent left arm weakness and progressively worsening left-sided chest pain. EXAM: MRI CERVICAL SPINE WITHOUT CONTRAST TECHNIQUE: Multiplanar, multisequence MR imaging of the cervical spine was performed. No intravenous contrast was administered. COMPARISON:  None available. FINDINGS: Alignment: Examination severely degraded by motion artifact. Straightening with slight reversal of the normal cervical lordosis, apex at C4-5. No listhesis or malalignment. Vertebrae: Vertebral body heights maintained without evidence for acute or chronic fracture. Bone marrow signal intensity within normal limits. No appreciable discrete  or worrisome osseous lesions identified on this motion degraded exam. No abnormal marrow edema. Cord: Signal intensity within the cervical spinal cord grossly within normal limits. Posterior Fossa, vertebral arteries, paraspinal tissues: Visualized brain and posterior fossa within normal limits. Craniocervical junction normal. Paraspinous and prevertebral soft tissues demonstrate no obvious abnormality.  Normal intravascular flow voids within the vertebral arteries grossly maintained. Disc levels: C2-C3: Negative. C3-C4: Chronic intervertebral disc space narrowing with diffuse disc bulge and bilateral uncovertebral hypertrophy. Flattening of the ventral CSF without significant spinal stenosis. Moderate left with mild right C4 foraminal stenosis. C4-C5: Diffuse degenerative disc osteophyte with intervertebral disc space narrowing. Posterior component indents and partially faces the ventral thecal sac. Mild spinal stenosis without cord deformity. Suspected mild to moderate left C5 foraminal narrowing. C5-C6: Diffuse degenerative disc osteophyte with intervertebral disc space narrowing. Flattening of the ventral CSF with resultant mild spinal stenosis. No cord deformity. Moderate right with mild left C6 foraminal stenosis. C6-C7: Diffuse degenerative disc osteophyte with intervertebral disc space narrowing. Right greater than left uncovertebral spurring. No significant spinal stenosis. Severe right with moderate to severe left C7 foraminal narrowing. C7-T1: Mild facet hypertrophy. No significant spinal stenosis. Foramina are grossly patent. Visualized upper thoracic spine demonstrates no significant finding. IMPRESSION: 1. Technically limited study due to severe motion artifact. 2. 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 No cord impingement or deformity. 3. Multifactorial degenerative changes with resultant multilevel foraminal narrowing as above. Notable findings include moderate left C4 foraminal stenosis, mild to moderate left C5 foraminal narrowing, moderate right C6 foraminal stenosis, with severe right and moderate to severe left C7 foraminal narrowing. Electronically Signed   By: Jeannine Boga M.D.   On: 08/20/2018 19:29   Mr Jodene Nam Head Wo Contrast  Result Date: 08/20/2018 CLINICAL DATA:  Headaches since last night, LEFT arm  weakness today. History of end-stage renal disease on dialysis, hypertension and diabetes. EXAM: MRI HEAD WITHOUT CONTRAST MRA HEAD WITHOUT CONTRAST TECHNIQUE: Multiplanar, multiecho pulse sequences of the brain and surrounding structures were obtained without intravenous contrast. Angiographic images of the head were obtained using MRA technique without contrast. COMPARISON:  CT HEAD August 19, 2018 FINDINGS: MRI HEAD FINDINGS INTRACRANIAL CONTENTS: No reduced diffusion to suggest acute ischemia. No susceptibility artifact to suggest hemorrhage. The ventricles and sulci are normal for patient's age. Patchy supratentorial white matter FLAIR T2 hyperintensities. No suspicious parenchymal signal, masses, mass effect. No abnormal extra-axial fluid collections. No extra-axial masses. VASCULAR: Normal major intracranial vascular flow voids present at skull base. SKULL AND UPPER CERVICAL SPINE: No abnormal sellar expansion. No suspicious calvarial bone marrow signal. Craniocervical junction maintained. SINUSES/ORBITS: Mild paranasal sinus mucosal thickening. Included mastoid air cells are well aerated. Old LEFT medial orbital blowout fracture. OTHER: None. MRA HEAD FINDINGS ANTERIOR CIRCULATION: Normal flow related enhancement of the included cervical, petrous, cavernous and supraclinoid internal carotid arteries. Patent anterior communicating artery. Patent anterior and middle cerebral arteries. No large vessel occlusion, flow limiting stenosis, aneurysm. POSTERIOR CIRCULATION: LEFT vertebral artery is dominant. RIGHT vertebral artery terminates in the posterior inferior cerebellar artery. Vertebrobasilar arteries are patent, with normal flow related enhancement of the main branch vessels. Patent posterior cerebral arteries. Robust bilateral posterior communicating arteries present. No large vessel occlusion, flow limiting stenosis,  aneurysm. ANATOMIC VARIANTS: None. Source images and MIP images were reviewed.  IMPRESSION: MRI HEAD: 1. No acute intracranial process. 2. Mild-to-moderate chronic small vessel ischemic changes. MRA HEAD: 1. Negative noncontrast  MRA head. Electronically Signed   By: Elon Alas M.D.   On: 08/20/2018 01:39   Vas US Carotid (at [place] Only)  Result Date: 08/20/2018 Carotid Arterial Duplex Study Indications: TIA. Limitations: Patient body habitus, patient movement, patient anatomy Performing Technologist: Oliver Hum RVT  Examination Guidelines: A complete evaluation includes B-mode imaging, spectral Doppler, color Doppler, and power Doppler as needed of all accessible portions of each vessel. Bilateral testing is considered an integral part of a complete examination. Limited examinations for reoccurring indications may be performed as noted.  Right Carotid Findings: +----------+--------+--------+--------+-----------------------+--------+           PSV cm/sEDV cm/sStenosisDescribe               Comments +----------+--------+--------+--------+-----------------------+--------+ CCA Prox  58      8               smooth and heterogenous         +----------+--------+--------+--------+-----------------------+--------+ CCA Distal43      9               smooth and heterogenous         +----------+--------+--------+--------+-----------------------+--------+ ICA Prox  59      17              smooth and heterogenous         +----------+--------+--------+--------+-----------------------+--------+ ICA Distal51      18                                     tortuous +----------+--------+--------+--------+-----------------------+--------+ ECA       35      6                                      tortuous +----------+--------+--------+--------+-----------------------+--------+ +----------+--------+-------+--------+-------------------+           PSV cm/sEDV cmsDescribeArm Pressure (mmHG) +----------+--------+-------+--------+-------------------+  [id number]                                         +----------+--------+-------+--------+-------------------+ +---------+--------+--+--------+-+---------+ VertebralPSV cm/s32EDV cm/s8Antegrade +---------+--------+--+--------+-+---------+  Left Carotid Findings: +----------+--------+-------+--------+--------------------------------+--------+           PSV cm/sEDV    StenosisDescribe                        Comments                   cm/s                                                    +----------+--------+-------+--------+--------------------------------+--------+ CCA Prox  58      9              smooth and heterogenous                  +----------+--------+-------+--------+--------------------------------+--------+ CCA Distal45      9              smooth and heterogenous                  +----------+--------+-------+--------+--------------------------------+--------+  ICA Prox  78      25             smooth, heterogenous and                                                  calcific                                 +----------+--------+-------+--------+--------------------------------+--------+ ICA Distal57      25                                             tortuous +----------+--------+-------+--------+--------------------------------+--------+ ECA       44      6                                                       +----------+--------+-------+--------+--------------------------------+--------+ +----------+--------+--------+--------+-------------------+ SubclavianPSV cm/sEDV cm/sDescribeArm Pressure (mmHG) +----------+--------+--------+--------+-------------------+           105                                         +----------+--------+--------+--------+-------------------+ +---------+--------+--+--------+-+---------+ VertebralPSV cm/s32EDV cm/s7Antegrade +---------+--------+--+--------+-+---------+  Summary: Right  Carotid: Velocities in the right ICA are consistent with a 1-39% stenosis. Left Carotid: Velocities in the left ICA are consistent with a 1-39% stenosis. Vertebrals: Bilateral vertebral arteries demonstrate antegrade flow. *See table(s) above for measurements and observations.  Electronically signed by Antony Contras MD on 08/20/2018 at 12:28:22 PM.    Final     Cardiac Studies   TTE EF 50-55% no new RWMAls mild AS   Patient Profile     63 y.o. 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 Algis Liming.  Assessment & Plan    Chest Pain: chronic mix of angina and muscular No signs of acute coronary syndrome ECG non acute TTE  With no new RWMA;s  Imdur increased to 120 mg daily Outpatient f/u with Dr [name]  [facility] will sign off.   Medication Recommendations:  Imdur 120 mg daily  Other recommendations (labs, testing, etc):  None  Follow up as an outpatient:  [name]  For questions or updates, please contact [place] Please consult [email or web address] for contact info under         Signed, Jenkins Rouge, MD  08/21/2018, 7:59 AM

## 2018-08-23 LAB — HEPATITIS B SURFACE ANTIBODY, QUANTITATIVE: Hep B S AB Quant (Post): 316 m[IU]/mL (ref >9.9)

## 2018-08-23 LAB — HEPATITIS B SURFACE ANTIGEN: Hepatitis B Surface Ag: NEGATIVE

## 2018-08-24 DIAGNOSIS — I10 Essential (primary) hypertension: Secondary | ICD-10-CM | POA: Diagnosis not present

## 2018-08-24 DIAGNOSIS — E1029 Type 1 diabetes mellitus with other diabetic kidney complication: Secondary | ICD-10-CM | POA: Diagnosis not present

## 2018-08-24 DIAGNOSIS — D509 Iron deficiency anemia, unspecified: Secondary | ICD-10-CM | POA: Diagnosis not present

## 2018-08-24 DIAGNOSIS — N2581 Secondary hyperparathyroidism of renal origin: Secondary | ICD-10-CM | POA: Diagnosis not present

## 2018-08-24 DIAGNOSIS — J302 Other seasonal allergic rhinitis: Secondary | ICD-10-CM | POA: Diagnosis not present

## 2018-08-24 DIAGNOSIS — Z992 Dependence on renal dialysis: Secondary | ICD-10-CM | POA: Diagnosis not present

## 2018-08-24 DIAGNOSIS — N186 End stage renal disease: Secondary | ICD-10-CM | POA: Diagnosis not present

## 2018-08-24 DIAGNOSIS — D631 Anemia in chronic kidney disease: Secondary | ICD-10-CM | POA: Diagnosis not present

## 2018-08-24 DIAGNOSIS — E119 Type 2 diabetes mellitus without complications: Secondary | ICD-10-CM | POA: Diagnosis not present

## 2018-08-26 ENCOUNTER — Other Ambulatory Visit: Payer: Self-pay | Admitting: Nephrology

## 2018-08-26 DIAGNOSIS — D631 Anemia in chronic kidney disease: Secondary | ICD-10-CM | POA: Diagnosis not present

## 2018-08-26 DIAGNOSIS — N2581 Secondary hyperparathyroidism of renal origin: Secondary | ICD-10-CM | POA: Diagnosis not present

## 2018-08-26 DIAGNOSIS — Z992 Dependence on renal dialysis: Secondary | ICD-10-CM | POA: Diagnosis not present

## 2018-08-26 DIAGNOSIS — D509 Iron deficiency anemia, unspecified: Secondary | ICD-10-CM | POA: Diagnosis not present

## 2018-08-26 DIAGNOSIS — N6320 Unspecified lump in the left breast, unspecified quadrant: Secondary | ICD-10-CM

## 2018-08-26 DIAGNOSIS — N186 End stage renal disease: Secondary | ICD-10-CM | POA: Diagnosis not present

## 2018-08-26 DIAGNOSIS — N644 Mastodynia: Secondary | ICD-10-CM

## 2018-08-26 DIAGNOSIS — E1029 Type 1 diabetes mellitus with other diabetic kidney complication: Secondary | ICD-10-CM | POA: Diagnosis not present

## 2018-08-28 DIAGNOSIS — J14 Pneumonia due to Hemophilus influenzae: Secondary | ICD-10-CM | POA: Diagnosis not present

## 2018-08-28 DIAGNOSIS — Z992 Dependence on renal dialysis: Secondary | ICD-10-CM | POA: Diagnosis not present

## 2018-08-28 DIAGNOSIS — D689 Coagulation defect, unspecified: Secondary | ICD-10-CM | POA: Diagnosis not present

## 2018-08-28 DIAGNOSIS — N2581 Secondary hyperparathyroidism of renal origin: Secondary | ICD-10-CM | POA: Diagnosis not present

## 2018-08-28 DIAGNOSIS — D509 Iron deficiency anemia, unspecified: Secondary | ICD-10-CM | POA: Diagnosis not present

## 2018-08-28 DIAGNOSIS — T8610 Unspecified complication of kidney transplant: Secondary | ICD-10-CM | POA: Diagnosis not present

## 2018-08-28 DIAGNOSIS — D631 Anemia in chronic kidney disease: Secondary | ICD-10-CM | POA: Diagnosis not present

## 2018-08-28 DIAGNOSIS — E1029 Type 1 diabetes mellitus with other diabetic kidney complication: Secondary | ICD-10-CM | POA: Diagnosis not present

## 2018-08-28 DIAGNOSIS — N186 End stage renal disease: Secondary | ICD-10-CM | POA: Diagnosis not present

## 2018-08-31 DIAGNOSIS — D689 Coagulation defect, unspecified: Secondary | ICD-10-CM | POA: Diagnosis not present

## 2018-08-31 DIAGNOSIS — D509 Iron deficiency anemia, unspecified: Secondary | ICD-10-CM | POA: Diagnosis not present

## 2018-08-31 DIAGNOSIS — N2581 Secondary hyperparathyroidism of renal origin: Secondary | ICD-10-CM | POA: Diagnosis not present

## 2018-08-31 DIAGNOSIS — E1029 Type 1 diabetes mellitus with other diabetic kidney complication: Secondary | ICD-10-CM | POA: Diagnosis not present

## 2018-08-31 DIAGNOSIS — Z992 Dependence on renal dialysis: Secondary | ICD-10-CM | POA: Diagnosis not present

## 2018-08-31 DIAGNOSIS — N186 End stage renal disease: Secondary | ICD-10-CM | POA: Diagnosis not present

## 2018-09-01 ENCOUNTER — Ambulatory Visit: Payer: Medicare Other

## 2018-09-01 ENCOUNTER — Ambulatory Visit
Admission: RE | Admit: 2018-09-01 | Discharge: 2018-09-01 | Disposition: A | Discharge status: Home / Self Care | Payer: Medicare Other | Source: Ambulatory Visit | Attending: Nephrology | Admitting: Nephrology

## 2018-09-01 DIAGNOSIS — N644 Mastodynia: Secondary | ICD-10-CM

## 2018-09-01 DIAGNOSIS — N6320 Unspecified lump in the left breast, unspecified quadrant: Secondary | ICD-10-CM

## 2018-09-02 DIAGNOSIS — N2581 Secondary hyperparathyroidism of renal origin: Secondary | ICD-10-CM | POA: Diagnosis not present

## 2018-09-02 DIAGNOSIS — Z992 Dependence on renal dialysis: Secondary | ICD-10-CM | POA: Diagnosis not present

## 2018-09-02 DIAGNOSIS — N186 End stage renal disease: Secondary | ICD-10-CM | POA: Diagnosis not present

## 2018-09-02 DIAGNOSIS — D689 Coagulation defect, unspecified: Secondary | ICD-10-CM | POA: Diagnosis not present

## 2018-09-02 DIAGNOSIS — E1029 Type 1 diabetes mellitus with other diabetic kidney complication: Secondary | ICD-10-CM | POA: Diagnosis not present

## 2018-09-02 DIAGNOSIS — D509 Iron deficiency anemia, unspecified: Secondary | ICD-10-CM | POA: Diagnosis not present

## 2018-09-04 DIAGNOSIS — D509 Iron deficiency anemia, unspecified: Secondary | ICD-10-CM | POA: Diagnosis not present

## 2018-09-04 DIAGNOSIS — Z992 Dependence on renal dialysis: Secondary | ICD-10-CM | POA: Diagnosis not present

## 2018-09-04 DIAGNOSIS — D689 Coagulation defect, unspecified: Secondary | ICD-10-CM | POA: Diagnosis not present

## 2018-09-04 DIAGNOSIS — N2581 Secondary hyperparathyroidism of renal origin: Secondary | ICD-10-CM | POA: Diagnosis not present

## 2018-09-04 DIAGNOSIS — N186 End stage renal disease: Secondary | ICD-10-CM | POA: Diagnosis not present

## 2018-09-04 DIAGNOSIS — E1029 Type 1 diabetes mellitus with other diabetic kidney complication: Secondary | ICD-10-CM | POA: Diagnosis not present

## 2018-09-07 DIAGNOSIS — N2581 Secondary hyperparathyroidism of renal origin: Secondary | ICD-10-CM | POA: Diagnosis not present

## 2018-09-07 DIAGNOSIS — D689 Coagulation defect, unspecified: Secondary | ICD-10-CM | POA: Diagnosis not present

## 2018-09-07 DIAGNOSIS — E1029 Type 1 diabetes mellitus with other diabetic kidney complication: Secondary | ICD-10-CM | POA: Diagnosis not present

## 2018-09-07 DIAGNOSIS — D509 Iron deficiency anemia, unspecified: Secondary | ICD-10-CM | POA: Diagnosis not present

## 2018-09-07 DIAGNOSIS — N186 End stage renal disease: Secondary | ICD-10-CM | POA: Diagnosis not present

## 2018-09-07 DIAGNOSIS — Z992 Dependence on renal dialysis: Secondary | ICD-10-CM | POA: Diagnosis not present

## 2018-09-09 DIAGNOSIS — D689 Coagulation defect, unspecified: Secondary | ICD-10-CM | POA: Diagnosis not present

## 2018-09-09 DIAGNOSIS — E1029 Type 1 diabetes mellitus with other diabetic kidney complication: Secondary | ICD-10-CM | POA: Diagnosis not present

## 2018-09-09 DIAGNOSIS — D509 Iron deficiency anemia, unspecified: Secondary | ICD-10-CM | POA: Diagnosis not present

## 2018-09-09 DIAGNOSIS — N186 End stage renal disease: Secondary | ICD-10-CM | POA: Diagnosis not present

## 2018-09-09 DIAGNOSIS — N2581 Secondary hyperparathyroidism of renal origin: Secondary | ICD-10-CM | POA: Diagnosis not present

## 2018-09-09 DIAGNOSIS — H2513 Age-related nuclear cataract, bilateral: Secondary | ICD-10-CM | POA: Diagnosis not present

## 2018-09-09 DIAGNOSIS — Z992 Dependence on renal dialysis: Secondary | ICD-10-CM | POA: Diagnosis not present

## 2018-09-11 DIAGNOSIS — N186 End stage renal disease: Secondary | ICD-10-CM | POA: Diagnosis not present

## 2018-09-11 DIAGNOSIS — E1029 Type 1 diabetes mellitus with other diabetic kidney complication: Secondary | ICD-10-CM | POA: Diagnosis not present

## 2018-09-11 DIAGNOSIS — Z992 Dependence on renal dialysis: Secondary | ICD-10-CM | POA: Diagnosis not present

## 2018-09-11 DIAGNOSIS — N2581 Secondary hyperparathyroidism of renal origin: Secondary | ICD-10-CM | POA: Diagnosis not present

## 2018-09-11 DIAGNOSIS — D689 Coagulation defect, unspecified: Secondary | ICD-10-CM | POA: Diagnosis not present

## 2018-09-11 DIAGNOSIS — D509 Iron deficiency anemia, unspecified: Secondary | ICD-10-CM | POA: Diagnosis not present

## 2018-09-13 DIAGNOSIS — N185 Chronic kidney disease, stage 5: Secondary | ICD-10-CM | POA: Diagnosis not present

## 2018-09-13 DIAGNOSIS — Z4902 Encounter for fitting and adjustment of peritoneal dialysis catheter: Secondary | ICD-10-CM | POA: Diagnosis not present

## 2018-09-13 DIAGNOSIS — K660 Peritoneal adhesions (postprocedural) (postinfection): Secondary | ICD-10-CM | POA: Diagnosis not present

## 2018-09-13 DIAGNOSIS — K430 Incisional hernia with obstruction, without gangrene: Secondary | ICD-10-CM | POA: Diagnosis not present

## 2018-09-14 DIAGNOSIS — I120 Hypertensive chronic kidney disease with stage 5 chronic kidney disease or end stage renal disease: Secondary | ICD-10-CM | POA: Diagnosis not present

## 2018-09-14 DIAGNOSIS — N186 End stage renal disease: Secondary | ICD-10-CM | POA: Diagnosis not present

## 2018-09-14 DIAGNOSIS — K430 Incisional hernia with obstruction, without gangrene: Secondary | ICD-10-CM | POA: Diagnosis not present

## 2018-09-14 DIAGNOSIS — D631 Anemia in chronic kidney disease: Secondary | ICD-10-CM | POA: Diagnosis not present

## 2018-09-14 DIAGNOSIS — Z992 Dependence on renal dialysis: Secondary | ICD-10-CM | POA: Diagnosis not present

## 2018-09-15 DIAGNOSIS — D631 Anemia in chronic kidney disease: Secondary | ICD-10-CM | POA: Diagnosis not present

## 2018-09-15 DIAGNOSIS — E1122 Type 2 diabetes mellitus with diabetic chronic kidney disease: Secondary | ICD-10-CM | POA: Diagnosis present

## 2018-09-15 DIAGNOSIS — K660 Peritoneal adhesions (postprocedural) (postinfection): Secondary | ICD-10-CM | POA: Diagnosis present

## 2018-09-15 DIAGNOSIS — E7800 Pure hypercholesterolemia, unspecified: Secondary | ICD-10-CM | POA: Diagnosis present

## 2018-09-15 DIAGNOSIS — Z992 Dependence on renal dialysis: Secondary | ICD-10-CM | POA: Diagnosis not present

## 2018-09-15 DIAGNOSIS — N186 End stage renal disease: Secondary | ICD-10-CM | POA: Diagnosis not present

## 2018-09-15 DIAGNOSIS — K430 Incisional hernia with obstruction, without gangrene: Secondary | ICD-10-CM | POA: Diagnosis present

## 2018-09-15 DIAGNOSIS — Z87891 Personal history of nicotine dependence: Secondary | ICD-10-CM | POA: Diagnosis not present

## 2018-09-15 DIAGNOSIS — Z951 Presence of aortocoronary bypass graft: Secondary | ICD-10-CM | POA: Diagnosis not present

## 2018-09-15 DIAGNOSIS — Z940 Kidney transplant status: Secondary | ICD-10-CM | POA: Diagnosis not present

## 2018-09-15 DIAGNOSIS — I120 Hypertensive chronic kidney disease with stage 5 chronic kidney disease or end stage renal disease: Secondary | ICD-10-CM | POA: Diagnosis present

## 2018-09-18 DIAGNOSIS — N2581 Secondary hyperparathyroidism of renal origin: Secondary | ICD-10-CM | POA: Diagnosis not present

## 2018-09-18 DIAGNOSIS — E1029 Type 1 diabetes mellitus with other diabetic kidney complication: Secondary | ICD-10-CM | POA: Diagnosis not present

## 2018-09-18 DIAGNOSIS — N186 End stage renal disease: Secondary | ICD-10-CM | POA: Diagnosis not present

## 2018-09-18 DIAGNOSIS — I4891 Unspecified atrial fibrillation: Secondary | ICD-10-CM | POA: Diagnosis not present

## 2018-09-18 DIAGNOSIS — Z992 Dependence on renal dialysis: Secondary | ICD-10-CM | POA: Diagnosis not present

## 2018-09-18 DIAGNOSIS — D509 Iron deficiency anemia, unspecified: Secondary | ICD-10-CM | POA: Diagnosis not present

## 2018-09-18 DIAGNOSIS — D689 Coagulation defect, unspecified: Secondary | ICD-10-CM | POA: Diagnosis not present

## 2018-09-21 DIAGNOSIS — N186 End stage renal disease: Secondary | ICD-10-CM | POA: Diagnosis not present

## 2018-09-21 DIAGNOSIS — D689 Coagulation defect, unspecified: Secondary | ICD-10-CM | POA: Diagnosis not present

## 2018-09-21 DIAGNOSIS — Z992 Dependence on renal dialysis: Secondary | ICD-10-CM | POA: Diagnosis not present

## 2018-09-21 DIAGNOSIS — N2581 Secondary hyperparathyroidism of renal origin: Secondary | ICD-10-CM | POA: Diagnosis not present

## 2018-09-21 DIAGNOSIS — E1029 Type 1 diabetes mellitus with other diabetic kidney complication: Secondary | ICD-10-CM | POA: Diagnosis not present

## 2018-09-21 DIAGNOSIS — D509 Iron deficiency anemia, unspecified: Secondary | ICD-10-CM | POA: Diagnosis not present

## 2018-09-23 DIAGNOSIS — D509 Iron deficiency anemia, unspecified: Secondary | ICD-10-CM | POA: Diagnosis not present

## 2018-09-23 DIAGNOSIS — D689 Coagulation defect, unspecified: Secondary | ICD-10-CM | POA: Diagnosis not present

## 2018-09-23 DIAGNOSIS — N186 End stage renal disease: Secondary | ICD-10-CM | POA: Diagnosis not present

## 2018-09-23 DIAGNOSIS — Z992 Dependence on renal dialysis: Secondary | ICD-10-CM | POA: Diagnosis not present

## 2018-09-23 DIAGNOSIS — N2581 Secondary hyperparathyroidism of renal origin: Secondary | ICD-10-CM | POA: Diagnosis not present

## 2018-09-23 DIAGNOSIS — E1029 Type 1 diabetes mellitus with other diabetic kidney complication: Secondary | ICD-10-CM | POA: Diagnosis not present

## 2018-09-25 DIAGNOSIS — Z992 Dependence on renal dialysis: Secondary | ICD-10-CM | POA: Diagnosis not present

## 2018-09-25 DIAGNOSIS — D689 Coagulation defect, unspecified: Secondary | ICD-10-CM | POA: Diagnosis not present

## 2018-09-25 DIAGNOSIS — D509 Iron deficiency anemia, unspecified: Secondary | ICD-10-CM | POA: Diagnosis not present

## 2018-09-25 DIAGNOSIS — N2581 Secondary hyperparathyroidism of renal origin: Secondary | ICD-10-CM | POA: Diagnosis not present

## 2018-09-25 DIAGNOSIS — N186 End stage renal disease: Secondary | ICD-10-CM | POA: Diagnosis not present

## 2018-09-25 DIAGNOSIS — E1029 Type 1 diabetes mellitus with other diabetic kidney complication: Secondary | ICD-10-CM | POA: Diagnosis not present

## 2018-09-26 DIAGNOSIS — T8610 Unspecified complication of kidney transplant: Secondary | ICD-10-CM | POA: Diagnosis not present

## 2018-09-26 DIAGNOSIS — N186 End stage renal disease: Secondary | ICD-10-CM | POA: Diagnosis not present

## 2018-09-26 DIAGNOSIS — Z992 Dependence on renal dialysis: Secondary | ICD-10-CM | POA: Diagnosis not present

## 2018-09-28 DIAGNOSIS — J14 Pneumonia due to Hemophilus influenzae: Secondary | ICD-10-CM | POA: Diagnosis not present

## 2018-09-28 DIAGNOSIS — D631 Anemia in chronic kidney disease: Secondary | ICD-10-CM | POA: Diagnosis not present

## 2018-09-28 DIAGNOSIS — N186 End stage renal disease: Secondary | ICD-10-CM | POA: Diagnosis not present

## 2018-09-28 DIAGNOSIS — D509 Iron deficiency anemia, unspecified: Secondary | ICD-10-CM | POA: Diagnosis not present

## 2018-09-28 DIAGNOSIS — N2581 Secondary hyperparathyroidism of renal origin: Secondary | ICD-10-CM | POA: Diagnosis not present

## 2018-09-30 DIAGNOSIS — D631 Anemia in chronic kidney disease: Secondary | ICD-10-CM | POA: Diagnosis not present

## 2018-09-30 DIAGNOSIS — N186 End stage renal disease: Secondary | ICD-10-CM | POA: Diagnosis not present

## 2018-09-30 DIAGNOSIS — J14 Pneumonia due to Hemophilus influenzae: Secondary | ICD-10-CM | POA: Diagnosis not present

## 2018-09-30 DIAGNOSIS — N2581 Secondary hyperparathyroidism of renal origin: Secondary | ICD-10-CM | POA: Diagnosis not present

## 2018-09-30 DIAGNOSIS — D509 Iron deficiency anemia, unspecified: Secondary | ICD-10-CM | POA: Diagnosis not present

## 2018-10-01 ENCOUNTER — Other Ambulatory Visit: Payer: Self-pay

## 2018-10-01 ENCOUNTER — Inpatient Hospital Stay (HOSPITAL_COMMUNITY)
Admission: EM | Admit: 2018-10-01 | Discharge: 2018-10-05 | DRG: 919 | Disposition: A | Discharge status: Home / Self Care | Payer: Medicare Other | Attending: Internal Medicine | Admitting: Internal Medicine

## 2018-10-01 ENCOUNTER — Encounter (HOSPITAL_COMMUNITY): Payer: Self-pay | Admitting: Emergency Medicine

## 2018-10-01 ENCOUNTER — Emergency Department (HOSPITAL_COMMUNITY): Payer: Medicare Other

## 2018-10-01 DIAGNOSIS — Z87891 Personal history of nicotine dependence: Secondary | ICD-10-CM

## 2018-10-01 DIAGNOSIS — Z992 Dependence on renal dialysis: Secondary | ICD-10-CM

## 2018-10-01 DIAGNOSIS — I132 Hypertensive heart and chronic kidney disease with heart failure and with stage 5 chronic kidney disease, or end stage renal disease: Secondary | ICD-10-CM | POA: Diagnosis not present

## 2018-10-01 DIAGNOSIS — N186 End stage renal disease: Secondary | ICD-10-CM | POA: Diagnosis not present

## 2018-10-01 DIAGNOSIS — R0600 Dyspnea, unspecified: Secondary | ICD-10-CM | POA: Diagnosis not present

## 2018-10-01 DIAGNOSIS — Z905 Acquired absence of kidney: Secondary | ICD-10-CM

## 2018-10-01 DIAGNOSIS — I25119 Atherosclerotic heart disease of native coronary artery with unspecified angina pectoris: Secondary | ICD-10-CM | POA: Diagnosis present

## 2018-10-01 DIAGNOSIS — E118 Type 2 diabetes mellitus with unspecified complications: Secondary | ICD-10-CM | POA: Diagnosis present

## 2018-10-01 DIAGNOSIS — R188 Other ascites: Secondary | ICD-10-CM | POA: Diagnosis present

## 2018-10-01 DIAGNOSIS — R7989 Other specified abnormal findings of blood chemistry: Secondary | ICD-10-CM | POA: Diagnosis not present

## 2018-10-01 DIAGNOSIS — R1011 Right upper quadrant pain: Secondary | ICD-10-CM

## 2018-10-01 DIAGNOSIS — R918 Other nonspecific abnormal finding of lung field: Secondary | ICD-10-CM | POA: Diagnosis not present

## 2018-10-01 DIAGNOSIS — K651 Peritoneal abscess: Secondary | ICD-10-CM | POA: Diagnosis not present

## 2018-10-01 DIAGNOSIS — T8612 Kidney transplant failure: Secondary | ICD-10-CM | POA: Diagnosis present

## 2018-10-01 DIAGNOSIS — I428 Other cardiomyopathies: Secondary | ICD-10-CM | POA: Diagnosis present

## 2018-10-01 DIAGNOSIS — Z91041 Radiographic dye allergy status: Secondary | ICD-10-CM

## 2018-10-01 DIAGNOSIS — R778 Other specified abnormalities of plasma proteins: Secondary | ICD-10-CM

## 2018-10-01 DIAGNOSIS — Z940 Kidney transplant status: Secondary | ICD-10-CM

## 2018-10-01 DIAGNOSIS — Z888 Allergy status to other drugs, medicaments and biological substances status: Secondary | ICD-10-CM

## 2018-10-01 DIAGNOSIS — D631 Anemia in chronic kidney disease: Secondary | ICD-10-CM | POA: Diagnosis present

## 2018-10-01 DIAGNOSIS — E785 Hyperlipidemia, unspecified: Secondary | ICD-10-CM | POA: Diagnosis present

## 2018-10-01 DIAGNOSIS — Z8249 Family history of ischemic heart disease and other diseases of the circulatory system: Secondary | ICD-10-CM

## 2018-10-01 DIAGNOSIS — M109 Gout, unspecified: Secondary | ICD-10-CM | POA: Diagnosis present

## 2018-10-01 DIAGNOSIS — E1122 Type 2 diabetes mellitus with diabetic chronic kidney disease: Secondary | ICD-10-CM | POA: Diagnosis present

## 2018-10-01 DIAGNOSIS — Y838 Other surgical procedures as the cause of abnormal reaction of the patient, or of later complication, without mention of misadventure at the time of the procedure: Secondary | ICD-10-CM | POA: Diagnosis present

## 2018-10-01 DIAGNOSIS — I2582 Chronic total occlusion of coronary artery: Secondary | ICD-10-CM | POA: Diagnosis present

## 2018-10-01 DIAGNOSIS — I252 Old myocardial infarction: Secondary | ICD-10-CM

## 2018-10-01 DIAGNOSIS — I10 Essential (primary) hypertension: Secondary | ICD-10-CM | POA: Diagnosis present

## 2018-10-01 DIAGNOSIS — R0602 Shortness of breath: Secondary | ICD-10-CM | POA: Diagnosis not present

## 2018-10-01 DIAGNOSIS — Z87442 Personal history of urinary calculi: Secondary | ICD-10-CM

## 2018-10-01 DIAGNOSIS — Y830 Surgical operation with transplant of whole organ as the cause of abnormal reaction of the patient, or of later complication, without mention of misadventure at the time of the procedure: Secondary | ICD-10-CM | POA: Diagnosis present

## 2018-10-01 DIAGNOSIS — Z7982 Long term (current) use of aspirin: Secondary | ICD-10-CM

## 2018-10-01 DIAGNOSIS — I4892 Unspecified atrial flutter: Secondary | ICD-10-CM | POA: Diagnosis present

## 2018-10-01 DIAGNOSIS — R079 Chest pain, unspecified: Secondary | ICD-10-CM

## 2018-10-01 DIAGNOSIS — I5032 Chronic diastolic (congestive) heart failure: Secondary | ICD-10-CM | POA: Diagnosis present

## 2018-10-01 DIAGNOSIS — M1990 Unspecified osteoarthritis, unspecified site: Secondary | ICD-10-CM | POA: Diagnosis present

## 2018-10-01 DIAGNOSIS — Z79891 Long term (current) use of opiate analgesic: Secondary | ICD-10-CM

## 2018-10-01 DIAGNOSIS — N2581 Secondary hyperparathyroidism of renal origin: Secondary | ICD-10-CM | POA: Diagnosis present

## 2018-10-01 DIAGNOSIS — Z79899 Other long term (current) drug therapy: Secondary | ICD-10-CM

## 2018-10-01 DIAGNOSIS — L7634 Postprocedural seroma of skin and subcutaneous tissue following other procedure: Secondary | ICD-10-CM | POA: Diagnosis not present

## 2018-10-01 DIAGNOSIS — Z951 Presence of aortocoronary bypass graft: Secondary | ICD-10-CM

## 2018-10-01 DIAGNOSIS — Z7902 Long term (current) use of antithrombotics/antiplatelets: Secondary | ICD-10-CM

## 2018-10-01 DIAGNOSIS — Z8349 Family history of other endocrine, nutritional and metabolic diseases: Secondary | ICD-10-CM

## 2018-10-01 DIAGNOSIS — K7689 Other specified diseases of liver: Secondary | ICD-10-CM | POA: Diagnosis not present

## 2018-10-01 DIAGNOSIS — I358 Other nonrheumatic aortic valve disorders: Secondary | ICD-10-CM | POA: Diagnosis present

## 2018-10-01 HISTORY — DX: Essential (primary) hypertension: I10

## 2018-10-01 HISTORY — DX: Personal history of other diseases of the circulatory system: Z86.79

## 2018-10-01 HISTORY — DX: Personal history of pneumonia (recurrent): Z87.01

## 2018-10-01 HISTORY — DX: Male erectile dysfunction, unspecified: N52.9

## 2018-10-01 HISTORY — DX: Unspecified atrial flutter: I48.92

## 2018-10-01 LAB — CBC
HCT: 31.1 % — ABNORMAL LOW (ref 39.0–52.0)
Hemoglobin: 9 g/dL — ABNORMAL LOW (ref 13.0–17.0)
MCH: 26.5 pg (ref 26.0–34.0)
MCHC: 28.9 g/dL — ABNORMAL LOW (ref 30.0–36.0)
MCV: 91.5 fL (ref 80.0–100.0)
Platelets: 195 10*3/uL (ref 150–400)
RBC: 3.4 MIL/uL — AB (ref 4.22–5.81)
RDW: 17.5 % — ABNORMAL HIGH (ref 11.5–15.5)
WBC: 6.9 10*3/uL (ref 4.0–10.5)
nRBC: 0 % (ref 0.0–0.2)

## 2018-10-01 LAB — COMPREHENSIVE METABOLIC PANEL
ALBUMIN: 3.3 g/dL — AB (ref 3.5–5.0)
ALT: 11 U/L (ref 0–44)
AST: 20 U/L (ref 15–41)
Alkaline Phosphatase: 70 U/L (ref 38–126)
Anion gap: 13 (ref 5–15)
BUN: 29 mg/dL — ABNORMAL HIGH (ref 8–23)
CHLORIDE: 103 mmol/L (ref 98–111)
CO2: 23 mmol/L (ref 22–32)
Calcium: 9.7 mg/dL (ref 8.9–10.3)
Creatinine, Ser: 10.09 mg/dL — ABNORMAL HIGH (ref 0.61–1.24)
GFR calc Af Amer: 6 mL/min — ABNORMAL LOW (ref >60)
GFR calc non Af Amer: 5 mL/min — ABNORMAL LOW (ref >60)
Glucose, Bld: 101 mg/dL — ABNORMAL HIGH (ref 70–99)
Potassium: 3.4 mmol/L — ABNORMAL LOW (ref 3.5–5.1)
Sodium: 139 mmol/L (ref 135–145)
Total Bilirubin: 0.7 mg/dL (ref 0.3–1.2)
Total Protein: 7.1 g/dL (ref 6.5–8.1)

## 2018-10-01 LAB — I-STAT TROPONIN, ED: Troponin i, poc: 0.22 ng/mL (ref 0.00–0.08)

## 2018-10-01 LAB — LIPASE, BLOOD: Lipase: 41 U/L (ref 11–51)

## 2018-10-01 MED ORDER — SODIUM CHLORIDE 0.9 % IV SOLN
2.0000 g | INTRAVENOUS | Status: DC
  Administered 2018-10-02 – 2018-10-03 (×2): 2 g via INTRAVENOUS
  Filled 2018-10-01 (×3): qty 20

## 2018-10-01 MED ORDER — ONDANSETRON HCL 4 MG/2ML IJ SOLN: 4.0000 mg | Freq: Four times a day (QID) | INTRAMUSCULAR | Status: DC | PRN

## 2018-10-01 MED ORDER — ONDANSETRON HCL 4 MG PO TABS: 4.0000 mg | ORAL_TABLET | Freq: Four times a day (QID) | ORAL | Status: DC | PRN

## 2018-10-01 MED ORDER — ACETAMINOPHEN 500 MG PO TABS
1000.0000 mg | ORAL_TABLET | Freq: Four times a day (QID) | ORAL | Status: DC | PRN
  Administered 2018-10-04: 1000 mg via ORAL
  Filled 2018-10-01 (×2): qty 2

## 2018-10-01 MED ORDER — EZETIMIBE 10 MG PO TABS
10.0000 mg | ORAL_TABLET | Freq: Every day | ORAL | Status: DC
  Administered 2018-10-02 – 2018-10-05 (×4): 10 mg via ORAL
  Filled 2018-10-01 (×4): qty 1

## 2018-10-01 MED ORDER — SODIUM CHLORIDE 0.9% FLUSH
3.0000 mL | Freq: Once | INTRAVENOUS | Status: AC
  Administered 2018-10-03: 3 mL via INTRAVENOUS

## 2018-10-01 MED ORDER — AMLODIPINE BESYLATE 5 MG PO TABS
5.0000 mg | ORAL_TABLET | Freq: Every day | ORAL | Status: DC
  Administered 2018-10-03 – 2018-10-04 (×2): 5 mg via ORAL
  Filled 2018-10-01 (×4): qty 1

## 2018-10-01 MED ORDER — CALCIUM ACETATE (PHOS BINDER) 667 MG PO CAPS
2001.0000 mg | ORAL_CAPSULE | Freq: Every day | ORAL | Status: DC
  Administered 2018-10-03 – 2018-10-04 (×2): 2001 mg via ORAL
  Filled 2018-10-01 (×2): qty 3

## 2018-10-01 MED ORDER — CLOPIDOGREL BISULFATE 75 MG PO TABS: 75.0000 mg | ORAL_TABLET | Freq: Every day | ORAL | Status: DC

## 2018-10-01 MED ORDER — ISOSORBIDE MONONITRATE ER 30 MG PO TB24: 120.0000 mg | ORAL_TABLET | Freq: Every day | ORAL | Status: DC

## 2018-10-01 MED ORDER — ALBUTEROL SULFATE (2.5 MG/3ML) 0.083% IN NEBU: 3.0000 mL | INHALATION_SOLUTION | Freq: Four times a day (QID) | RESPIRATORY_TRACT | Status: DC | PRN

## 2018-10-01 MED ORDER — RENA-VITE PO TABS
1.0000 | ORAL_TABLET | Freq: Every day | ORAL | Status: DC
  Administered 2018-10-02 – 2018-10-05 (×4): 1 via ORAL
  Filled 2018-10-01 (×4): qty 1

## 2018-10-01 MED ORDER — REPAGLINIDE 1 MG PO TABS
0.5000 mg | ORAL_TABLET | Freq: Two times a day (BID) | ORAL | Status: DC
  Administered 2018-10-02 – 2018-10-05 (×6): 0.5 mg via ORAL
  Filled 2018-10-01 (×8): qty 0.5

## 2018-10-01 MED ORDER — ASPIRIN EC 81 MG PO TBEC: 81.0000 mg | DELAYED_RELEASE_TABLET | Freq: Every day | ORAL | Status: DC

## 2018-10-01 MED ORDER — ROSUVASTATIN CALCIUM 5 MG PO TABS
10.0000 mg | ORAL_TABLET | Freq: Every day | ORAL | Status: DC
  Administered 2018-10-02 – 2018-10-05 (×4): 10 mg via ORAL
  Filled 2018-10-01 (×4): qty 2

## 2018-10-01 NOTE — ED Notes (Signed)
Contacted [facility]) for Dr. Lita Mains for possible transfer;  waiting on follow up call.

## 2018-10-01 NOTE — ED Notes (Signed)
Sort RN Santiago Glad and Dr Reather Converse informed of troponin results .[place]

## 2018-10-01 NOTE — H&P (Signed)
History and Physical    Patrick [name] OXB:[id number] DOB: 10/21/1955 DOA: 10/01/2018  PCP: Nolene Ebbs, MD  Patient coming from: Home  I have personally briefly reviewed patient's old medical records in [place]  Chief Complaint: Abd pain  HPI: Patrick [name] is a 63 y.o. male with medical history significant of ESRD on TTS HD currently.  Patient had PD catheter placement on 09/13/2018 at [facility]  Had HD yesterday without complications.  Has not yet started PD.  Today he presents to ED with c/o RUQ abd pain, associated with some SOB with exertion.   ED Course: In ED patient noted to have elevated troponin of 0.22, no leukocytosis, no fever, CT reveals mild bibasilar airspace disease, and a large peritoneal fluid collection, seroma vs abscess.  H[facility] is unable to admit patient due to no beds available.  Patient requests that he be given a regular diet.  I tried to point out to him that not being on a renal diet could be whats making his BP elevated and I was somewhat suspicious that he may have some fluid retention with this.  He replied to me "no my blood pressure is elevated because I didn't take my meds today".  Review of Systems: As per HPI otherwise 10 point review of systems negative.   Past Medical History:  Diagnosis Date  . Arthritis    "back" (09/24/2016)  . CHF (congestive heart failure) ([place])   . Coronary artery disease   . Dysrhythmia    aflutter s/p ablation 2018  . ESRD (end stage renal disease) on dialysis ([place])    Pt on HD 2003 >> transplanted in 2009, back on HD in 2016. [name] TTS.   . Gout   . Hernia of abdominal wall   . History of blood transfusion 2009- 2016   "I've had several; low HgB"  . History of kidney stones    treated with nephrectomy  . Hypertension   . Impotence of organic origin   . Migraine    "stopped in my 30's; they were related to high BP" (09/24/2016)  . Myocardial infarction [facility]) '96  . Pneumonia 01/2015   " a touch & I  was in the hosp."  . Secondary hyperparathyroidism ([place])   . Type 2 diabetes mellitus (HCC)    no longer on medication since going on dialysis  . Wears glasses     Past Surgical History:  Procedure Laterality Date  . A-FLUTTER ABLATION N/A 09/24/2016   Procedure: A-Flutter Ablation;  Surgeon: Will Meredith Leeds, MD;  Location: [place] [facility];  Service: Cardiovascular;  Laterality: N/A;  . ABDOMINAL AORTOGRAM W/LOWER EXTREMITY N/A 04/13/2017   Procedure: ABDOMINAL AORTOGRAM W/LOWER EXTREMITY;  Surgeon: Conrad [place] Bay, MD;  Location: [place] [facility];  Service: Cardiovascular;  Laterality: N/A;  Bilater lower extermity  . APPENDECTOMY    . AV FISTULA PLACEMENT Right 09/18/2014   Procedure: INSERTION OF ARTERIOVENOUS (AV) GORE-TEX GRAFT ARM USING 4-7MM  X 45CM STRETCH GORE-TEX VASCULAR GRAFT;  Surgeon: Rosetta Posner, MD;  Location: [place];  Service: Vascular;  Laterality: Right;  . AV FISTULA PLACEMENT Left 07/07/2016   Procedure: INSERTION OF LEFT BRACHIAL TO AXILLARY ARTERIOVENOUS (AV) GORE-TEX ARM GRAFT;  Surgeon: Conrad Lodgepole, MD;  Location: [place];  Service: Vascular;  Laterality: Left;  . CARDIAC CATHETERIZATION  ~ 2016  . COLONOSCOPY    . 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 EXCHANGE OF A DIALYSIS CATHETER Left 01/11/2018   Procedure: EXCHANGE OF TUNNELED DIALYSIS CATHETER;  Surgeon: Rosetta Posner, MD;  Location: [place];  Service: Vascular;  Laterality: Left;  . HERNIA REPAIR  2017   with nephrectomy  . INSERTION OF DIALYSIS CATHETER N/A 10/08/2017   Procedure: INSERTION OF TUNNELED DIALYSIS CATHETER;  Surgeon: Conrad Yetter, MD;  Location: [place];  Service: Vascular;  Laterality: N/A;  . IR FLUORO GUIDE CV LINE LEFT  03/12/2018  . IR GENERIC HISTORICAL  05/11/2016   IR FLUORO GUIDE CV LINE LEFT 05/11/2016 Corrie Mckusick, DO [facility]  . IR GENERIC HISTORICAL  05/11/2016   IR US GUIDE VASC ACCESS LEFT  05/11/2016 Corrie Mckusick, DO [facility]  . IR GENERIC HISTORICAL  05/11/2016   IR US GUIDE VASC ACCESS RIGHT 05/11/2016 Corrie Mckusick, DO [facility]  . IR GENERIC HISTORICAL  05/11/2016   IR RADIOLOGY PERIPHERAL GUIDED IV START 05/11/2016 Corrie Mckusick, DO [facility]  . KIDNEY TRANSPLANT  2009  . LEFT HEART CATH AND CORS/GRAFTS ANGIOGRAPHY N/A 12/04/2017   Procedure: LEFT HEART CATH AND CORS/GRAFTS ANGIOGRAPHY;  Surgeon: Belva Crome, MD;  Location: [place] [facility];  Service: Cardiovascular;  Laterality: N/A;  . NEPHRECTOMY  2017   transplant rejected   . PERIPHERAL VASCULAR CATHETERIZATION N/A 06/04/2016   Procedure: Upper Extremity Venography;  Surgeon: Waynetta Sandy, MD;  Location: [place] [facility];  Service: Cardiovascular;  Laterality: N/A;  . PERIPHERAL VASCULAR INTERVENTION  04/13/2017   Procedure: PERIPHERAL VASCULAR INTERVENTION;  Surgeon: Conrad Sterling, MD;  Location: [place] [facility];  Service: Cardiovascular;;  Lt. Common/Exernal  Iliac  . THROMBECTOMY AND REVISION OF ARTERIOVENTOUS (AV) GORETEX  GRAFT Left 10/08/2017   Procedure: THROMBECTOMY of ARTERIOVENTOUS (AV) GORETEX  GRAFT LEFT UPPER ARM;  Surgeon: Conrad [place], MD;  Location: [place];  Service: Vascular;  Laterality: Left;  . THROMBECTOMY W/ EMBOLECTOMY Left 09/14/2017   Procedure: THROMBECTOMY ARTERIOVENOUS GORE-TEX GRAFT LEFT UPPER ARM;  Surgeon: Rosetta Posner, MD;  Location: [place];  Service: Vascular;  Laterality: Left;  . UPPER EXTREMITY VENOGRAPHY N/A 11/16/2017   Procedure: UPPER EXTREMITY VENOGRAPHY - Right Arm;  Surgeon: Conrad Sherwood, MD;  Location: [place] [facility];  Service: Cardiovascular;  Laterality: N/A;     reports that he quit smoking about 4 years ago. His smoking use included cigarettes. He has never used smokeless tobacco. He reports current alcohol use. He reports that he does not use drugs.  Allergies  Allergen Reactions  . Iodinated Diagnostic Agents Swelling, Rash and Other  (See Comments)    Other Reaction: burning to mouth, swelling of lips  . Lipitor [Atorvastatin] Other (See Comments)    Leg pain  . Metoprolol Other (See Comments)    Headaches   . Baclofen Other (See Comments)    Possibly stroke like symptoms    Family History  Problem Relation Age of Onset  . Hyperlipidemia Mother   . Hypertension Mother   . HIV Sister      Prior to Admission medications   Medication Sig Start Date End Date Taking? Authorizing Provider  acetaminophen (TYLENOL) 500 MG tablet Take 1,000 mg by mouth every 6 (six) hours as needed for moderate pain or headache.   Yes [provider]  albuterol (PROVENTIL HFA;VENTOLIN HFA) 108 (90 Base) MCG/ACT inhaler Inhale 1-2 puffs into the lungs every 6 (six) hours as needed for wheezing or shortness  of breath. 12/18/17  Yes Weaver, Scott T, PA-C  amLODipine (NORVASC) 5 MG tablet Take 1 tablet (5 mg total) by mouth daily. 11/27/17 08/19/26 Yes Richardson Dopp T, PA-C  aspirin EC 81 MG tablet Take 81 mg by mouth daily.   Yes [provider]  calcium acetate (PHOSLO) 667 MG capsule Take 2,001 mg by mouth daily with supper.    Yes [provider]  clopidogrel (PLAVIX) 75 MG tablet Take 1 tablet (75 mg total) by mouth daily. 12/18/17  Yes Weaver, Scott T, PA-C  ezetimibe (ZETIA) 10 MG tablet Take 1 tablet (10 mg total) by mouth daily. 04/02/18 08/19/26 Yes Weaver, Scott T, PA-C  gentamicin cream (GARAMYCIN) 0.1 % Apply 1 application topically daily as needed (prior to accessing PD catheter).  09/28/18  Yes [provider]  HYDROcodone-acetaminophen (NORCO/VICODIN) 5-325 MG tablet Take 1 tablet by mouth every 6 (six) hours as needed for moderate pain.   Yes [provider]  isosorbide mononitrate (IMDUR) 60 MG 24 hr tablet Take 2 tablets (120 mg total) by mouth daily. 08/21/18  Yes Hongalgi, Lenis Dickinson, MD  multivitamin (RENA-VIT) TABS tablet Take 1 tablet by mouth daily.   Yes [provider]    repaglinide (PRANDIN) 0.5 MG tablet Take 1 tablet (0.5 mg total) by mouth 2 (two) times daily before a meal. 04/05/18  Yes Renato Shin, MD  rosuvastatin (CRESTOR) 10 MG tablet Take 1 tablet (10 mg total) by mouth daily at 6 PM. Patient taking differently: Take 10 mg by mouth daily.  08/21/18  Yes Modena Jansky, MD    Physical Exam: Vitals:   10/01/18 2200 10/01/18 2245 10/01/18 2300 10/01/18 2315  BP: (!) 192/106 (!) 178/73 (!) 182/95 (!) 172/83  Pulse: 91 91 89   Resp: (!) 24 (!) 22 (!) 24 19  Temp:      TempSrc:      SpO2: 99% 98% 97%   Weight:      Height:        Constitutional: NAD, calm, comfortable Eyes: PERRL, lids and conjunctivae normal ENMT: Mucous membranes are moist. Posterior pharynx clear of any exudate or lesions.Normal dentition.  Neck: normal, supple, no masses, no thyromegaly Respiratory: rhonchi at bases Cardiovascular: Regular rate and rhythm, no murmurs / rubs / gallops. No extremity edema. 2+ pedal pulses. No carotid bruits.  Abdomen: Distended, mild diffuse TTP Musculoskeletal: no clubbing / cyanosis. No joint deformity upper and lower extremities. Good ROM, no contractures. Normal muscle tone.  Skin: no rashes, lesions, ulcers. No induration Neurologic: CN 2-12 grossly intact. Sensation intact, DTR normal. Strength 5/5 in all 4.  Psychiatric: Normal judgment and insight. Alert and oriented x 3. Normal mood.    Labs on Admission: I have personally reviewed following labs and imaging studies  CBC: Recent Labs  Lab 10/01/18 1927  WBC 6.9  HGB 9.0*  HCT 31.1*  MCV 91.5  PLT 850   Basic Metabolic Panel: Recent Labs  Lab 10/01/18 1927  NA 139  K 3.4*  CL 103  CO2 23  GLUCOSE 101*  BUN 29*  CREATININE 10.09*  CALCIUM 9.7   GFR: Estimated Creatinine Clearance: 8.4 mL/min (A) (by C-G formula based on SCr of 10.09 mg/dL (H)). Liver Function Tests: Recent Labs  Lab 10/01/18 1927  AST 20  ALT 11  ALKPHOS 70  BILITOT 0.7  PROT 7.1   ALBUMIN 3.3*   Recent Labs  Lab 10/01/18 1927  LIPASE 41   No results for input(s): AMMONIA  in the last 168 hours. Coagulation Profile: No results for input(s): INR, PROTIME in the last 168 hours. Cardiac Enzymes: No results for input(s): CKTOTAL, CKMB, CKMBINDEX, TROPONINI in the last 168 hours. BNP (last 3 results) No results for input(s): PROBNP in the last 8760 hours. HbA1C: No results for input(s): HGBA1C in the last 72 hours. CBG: No results for input(s): GLUCAP in the last 168 hours. Lipid Profile: No results for input(s): CHOL, HDL, LDLCALC, TRIG, CHOLHDL, LDLDIRECT in the last 72 hours. Thyroid Function Tests: No results for input(s): TSH, T4TOTAL, FREET4, T3FREE, THYROIDAB in the last 72 hours. Anemia Panel: No results for input(s): VITAMINB12, FOLATE, FERRITIN, TIBC, IRON, RETICCTPCT in the last 72 hours. Urine analysis:    Component Value Date/Time   COLORURINE RED (A) 11/17/2015 0910   APPEARANCEUR TURBID (A) 11/17/2015 0910   LABSPEC 1.035 (H) 11/17/2015 0910   PHURINE 6.0 11/17/2015 0910   GLUCOSEU 100 (A) 11/17/2015 0910   HGBUR LARGE (A) 11/17/2015 0910   BILIRUBINUR LARGE (A) 11/17/2015 0910   KETONESUR 40 (A) 11/17/2015 0910   PROTEINUR >300 (A) 11/17/2015 0910   UROBILINOGEN 0.2 11/24/2013 0242   NITRITE POSITIVE (A) 11/17/2015 0910   LEUKOCYTESUR LARGE (A) 11/17/2015 0910    Radiological Exams on Admission: Ct Abdomen Pelvis Wo Contrast  Result Date: 10/01/2018 CLINICAL DATA:  Shortness of breath.  Abdominal distention. EXAM: CT ABDOMEN AND PELVIS WITHOUT CONTRAST TECHNIQUE: Multidetector CT imaging of the abdomen and pelvis was performed following the standard protocol without IV contrast. COMPARISON:  None. FINDINGS: Lower chest: Heart is enlarged. Coronary artery calcifications are present. Mitral valve annular calcifications are present. Atherosclerotic changes are noted in the descending aorta. Mild bibasilar airspace disease is present. No  significant pleural or pericardial effusion is present. Hepatobiliary: No focal liver abnormality is seen. No gallstones, gallbladder wall thickening, or biliary dilatation. Pancreas: Unremarkable. No pancreatic ductal dilatation or surrounding inflammatory changes. Spleen: Normal in size without focal abnormality. Adrenals/Urinary Tract: Adrenal glands are normal bilaterally. 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 Ureters are within normal limits. The urinary bladder is collapsed. Stomach/Bowel: A stomach and duodenum are within normal limits. Proximal small bowel is unremarkable. There is focal inflammation of bowel loops of small bowel adjacent to a fluid collection along the anterior peritoneum on the right. The right anterior peritoneal fluid collection measures 12.9 x 6.5 x 14.6 cm. There is no obstruction. Terminal ileum is otherwise normal. The appendix is visualized and normal. The ascending and transverse colon is mostly collapsed. Diverticular changes are present in the descending and sigmoid colon without focal inflammation. Vascular/Lymphatic: Extensive vascular calcifications are present. There is no aneurysm. No significant adenopathy is present. Reproductive: Prostate is unremarkable. Other: Peritoneal dialysis catheter is in place. The anterior wall fluid collection seen on the previous study is stable. This is just anterior to the new collection. Elder collection measures 8.6 x 5.5 x 7.8 cm. There is some inflammatory change the subcutaneous tissues inferiorly. Musculoskeletal: Vertebral body heights and alignment are maintained. No focal lytic or blastic lesions are present. IMPRESSION: 1. New ventral peritoneal fluid collection measures 12.9 x 6.5 x 14.6 cm. This is worrisome for a loculated fluid collection associated with dialysis or developing abscess related to bowel disease. It appears separate from the other anterior abdominal wall fluid collection. 2. The more remote anterior wall  fluid collection is stable. 3. Inflammatory changes of bowel adjacent to the fluid collection, likely reflecting adhesions. There is no obstruction. This is in the region  of the previous hernia. 4. Dialysis catheter. 5. Aortic Atherosclerosis (ICD10-I70.0). Extensive vascular calcifications are present without aneurysm. 6. Cystic renal disease of dialysis. 7. Cardiomegaly without failure. 8. Coronary artery disease. Electronically Signed   By: San Morelle M.D.   On: 10/01/2018 21:40   Dg Chest Port 1 View  Result Date: 10/01/2018 CLINICAL DATA:  Dyspnea for the past 2 hours. EXAM: PORTABLE CHEST 1 VIEW COMPARISON:  08/19/2018 FINDINGS: Stable cardiomegaly with aortic atherosclerosis. Dual lumen dialysis catheter terminates in the right atrium unchanged in position. Median sternotomy sutures and post surgical clips project over the cardiac silhouette. No overt pulmonary edema. Hazy appearance at the left lung base may represent a small layering left effusion. No acute osseous abnormality. Vascular clips project over the superior mediastinum as well as a vascular stent projecting over the right upper thorax in the region of the subclavian artery. IMPRESSION: Stable cardiomegaly. Hazy opacity at the left lung base may represent a small layering left effusion, new since prior. No overt pulmonary edema. No other change of note. Electronically Signed   By: Ashley Royalty M.D.   On: 10/01/2018 20:56    EKG: Independently reviewed.  Assessment/Plan Principal Problem:   Peritoneal abscess ([place]) Active Problems:   Essential hypertension   Elevated troponin   Controlled diabetes mellitus type 2 with complications (HCC)   History of MI (myocardial infarction)   ESRD (end stage renal disease) ([place])    1. Peritoneal abscess vs seroma - 1. Empiric rocephin / vanc 2. IR eval and drain in AM, send for culture 3. BCx 4. No SIRS 5. And dont want to give fluid at the moment as BP is high, and im somewhat  suspicious he may have a bit of fluid overload as well with the trop of 0.22 1. BNP pending as well 2. Tele monitor 3. Serial trops 6. NPO 7. Holding ASA and Plavix (No stents, CABG 1997, occluded LAD with R to L collaterals on 2019 cath) 2. HTN - 1. Resume home BP meds 3. ESRD - 1. Call nephrology in AM 2. BMP in AM 4. DM2 - 1. On no meds, and says hes on a regular diet 2. Will Just do CBG checks Q4H for the moment  DVT prophylaxis: SCDs Code Status: Full Family Communication: Family at bedside Disposition Plan: Home after admit Consults called: None Admission status: Place in 36    [place], [place] Hospitalists  How to contact the [facility] Attending or Consulting provider [place] or covering provider during after hours [place], for this patient?  1. Check the care team in [facility] and look for a) attending/consulting [facility] provider listed and b) the [facility] team listed 2. Log into [email or web address]  Amion Physician Scheduling and messaging for groups and whole hospitals  On call and physician scheduling software for group practices, residents, hospitalists and other medical providers for call, clinic, rotation and shift schedules. OnCall [employer] is a hospital-wide system for scheduling doctors and paging doctors on call. EasyPlot is for scientific plotting and data analysis.  [email or web address]  and use [place]'s universal password to access. If you do not have the password, please contact the hospital operator.  3. Locate the [facility] provider you are looking for under [facility] and page to a number that you can be directly reached. 4. If you still have difficulty reaching the provider, please page the [facility] (Director on Call) for the Hospitalists listed on amion for assistance.  10/01/2018, 11:48 PM

## 2018-10-01 NOTE — ED Provider Notes (Signed)
[facility] EMERGENCY DEPARTMENT Provider Note   CSN: [id number] Arrival date & time: 10/01/18  1851    History   Chief Complaint Chief Complaint  Patient presents with  . Abdominal Pain  . Shortness of Breath    HPI CHEVY SWEIGERT is a 63 y.o. male.     HPI Patient with history of end-stage renal disease on hemodialysis.  Last dialyzed was yesterday.  Recently had lower abdominal hernia repair and peritoneal dialysis catheter placed.  Begin complaining of right upper abdominal pain this afternoon.  Associated with some shortness of breath initially with exertion.  He denies any chest pain.  No cough, fever or chills.  He has had generalized fatigue.  States he has regular bowel movements and is passing gas today.  Admits to some nausea earlier today but no vomiting.  Not necessarily associated with food.  Denies any new lower extremity swelling or pain.  No recent extended travel or immobilization. Past Medical History:  Diagnosis Date  . Arthritis    "back" (09/24/2016)  . CHF (congestive heart failure) ([place])   . Coronary artery disease   . Dysrhythmia    aflutter s/p ablation 2018  . ESRD (end stage renal disease) on dialysis ([place])    Pt on HD 2003 >> transplanted in 2009, back on HD in 2016. [name].   . Gout   . Hernia of abdominal wall   . History of blood transfusion 2009- 2016   "I've had several; low HgB"  . History of kidney stones    treated with nephrectomy  . Hypertension   . Impotence of organic origin   . Migraine    "stopped in my 30's; they were related to high BP" (09/24/2016)  . Myocardial infarction [place] [facility]) '96  . Pneumonia 01/2015   " a touch & I was in the hosp."  . Secondary hyperparathyroidism ([place])   . Type 2 diabetes mellitus (HCC)    no longer on medication since going on dialysis  . Wears glasses     Patient Active Problem List   Diagnosis Date Noted  . Peritoneal abscess ([place]) 10/01/2018  . Nausea and vomiting  08/20/2018  . Chronic anemia 05/12/2018  . Hypokalemia 05/12/2018  . Acute combined systolic and diastolic congestive heart failure ([place]) 05/12/2018  . Hyperlipidemia 05/12/2018  . Physical deconditioning 05/12/2018  . Multifocal pneumonia 05/11/2018  . Allergy to IVP dye, subsequent encounter   . CAD (coronary artery disease) 11/27/2017  . Atherosclerosis of native arteries of extremity with intermittent claudication ([place]) 04/03/2017  . PAD (peripheral artery disease) ([place]) 02/02/2017  . Typical atrial flutter ([place]) 09/24/2016  . PAF (paroxysmal atrial fibrillation) ([place]) 05/25/2016  . ESRD (end stage renal disease) ([place]) 05/11/2016  . Anemia of chronic disease   . End-stage renal disease on hemodialysis ([place])   . Renovascular hypertension   . History of MI (myocardial infarction)   . Acute respiratory failure with hypoxia ([place])   . Controlled diabetes mellitus type 2 with complications ([place])   . Pleural effusion   . Symptomatic anemia 01/22/2016  . Acute respiratory failure ([place]) 01/22/2016  . Chronic diastolic CHF (congestive heart failure) ([place]) 01/22/2016  . Elevated troponin 01/22/2016  . Febrile illness 01/22/2016  . Normocytic anemia 11/17/2015  . Gynecomastia 11/17/2015  . Renal stone 11/17/2015  . Cough 11/17/2015  . CAP (community acquired pneumonia)   . HCAP (healthcare-associated pneumonia) 10/11/2015  . Acute respiratory disease 10/11/2015  . Protein-calorie malnutrition, severe (  [place]) 09/16/2014  . 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 Metabolic acidosis, NAG, failure of bicarbonate regeneration 09/14/2014  . Anemia in chronic kidney disease 11/24/2013  . 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 Hypocalcemia 11/24/2013  . Low bicarbonate 11/24/2013  . Essential hypertension 03/19/2013  . Acute gout 03/19/2013  . Impotence of organic origin 03/19/2013  . Secondary hyperparathyroidism ([place]) 03/19/2013  . End-stage renal failure with renal transplant ([place])  03/19/2013  . 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 A-FLUTTER ABLATION N/A 09/24/2016   Procedure: A-Flutter Ablation;  Surgeon: Will Meredith Leeds, MD;  Location: [facility];  Service: Cardiovascular;  Laterality: N/A;  . ABDOMINAL AORTOGRAM W/LOWER EXTREMITY N/A 04/13/2017   Procedure: ABDOMINAL AORTOGRAM W/LOWER EXTREMITY;  Surgeon: Conrad Haines, MD;  Location: [facility];  Service: Cardiovascular;  Laterality: N/A;  Bilater lower extermity  . APPENDECTOMY    . AV FISTULA PLACEMENT Right 09/18/2014   Procedure: INSERTION OF ARTERIOVENOUS (AV) GORE-TEX GRAFT ARM USING 4-7MM  X 45CM STRETCH GORE-TEX VASCULAR GRAFT;  Surgeon: Rosetta Posner, MD;  Location: [facility];  Service: Vascular;  Laterality: Right;  . AV FISTULA PLACEMENT Left 07/07/2016   Procedure: INSERTION OF LEFT BRACHIAL TO AXILLARY ARTERIOVENOUS (AV) GORE-TEX ARM GRAFT;  Surgeon: Conrad Skidmore, MD;  Location: [facility];  Service: Vascular;  Laterality: Left;  . CARDIAC CATHETERIZATION  ~ 2016  . COLONOSCOPY    . 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 EXCHANGE OF A DIALYSIS CATHETER Left 01/11/2018   Procedure: EXCHANGE OF TUNNELED DIALYSIS CATHETER;  Surgeon: Rosetta Posner, MD;  Location: [facility];  Service: Vascular;  Laterality: Left;  . HERNIA REPAIR  2017   with nephrectomy  . INSERTION OF DIALYSIS CATHETER N/A 10/08/2017   Procedure: INSERTION OF TUNNELED DIALYSIS CATHETER;  Surgeon: Conrad Madrid, MD;  Location: [facility];  Service: Vascular;  Laterality: N/A;  . IR FLUORO GUIDE CV LINE LEFT  03/12/2018  . IR GENERIC HISTORICAL  05/11/2016   IR FLUORO GUIDE CV LINE LEFT 05/11/2016 Corrie Mckusick, DO [facility]  . IR GENERIC HISTORICAL  05/11/2016   IR US GUIDE VASC ACCESS LEFT 05/11/2016 Corrie Mckusick, DO [facility]  . IR GENERIC HISTORICAL   05/11/2016   IR US GUIDE VASC ACCESS RIGHT 05/11/2016 Corrie Mckusick, DO [facility]  . IR GENERIC HISTORICAL  05/11/2016   IR RADIOLOGY PERIPHERAL GUIDED IV START 05/11/2016 Corrie Mckusick, DO [facility]  . KIDNEY TRANSPLANT  2009  . LEFT HEART CATH AND CORS/GRAFTS ANGIOGRAPHY N/A 12/04/2017   Procedure: LEFT HEART CATH AND CORS/GRAFTS ANGIOGRAPHY;  Surgeon: Belva Crome, MD;  Location: [facility];  Service: Cardiovascular;  Laterality: N/A;  . NEPHRECTOMY  2017   transplant rejected   . PERIPHERAL VASCULAR CATHETERIZATION N/A 06/04/2016   Procedure: Upper Extremity Venography;  Surgeon: Waynetta Sandy, MD;  Location: [facility];  Service: Cardiovascular;  Laterality: N/A;  . PERIPHERAL VASCULAR INTERVENTION  04/13/2017   Procedure: PERIPHERAL VASCULAR INTERVENTION;  Surgeon: Conrad , MD;  Location: [facility];  Service: Cardiovascular;;  Lt. Common/Exernal  Iliac  . THROMBECTOMY AND REVISION OF ARTERIOVENTOUS (AV) GORETEX  GRAFT Left 10/08/2017   Procedure: THROMBECTOMY of ARTERIOVENTOUS (AV) GORETEX  GRAFT LEFT UPPER ARM;  Surgeon: Adele Barthel  L, MD;  Location: [facility];  Service: Vascular;  Laterality: Left;  . THROMBECTOMY W/ EMBOLECTOMY Left 09/14/2017   Procedure: THROMBECTOMY ARTERIOVENOUS GORE-TEX GRAFT LEFT UPPER ARM;  Surgeon: Rosetta Posner, MD;  Location: [facility];  Service: Vascular;  Laterality: Left;  . UPPER EXTREMITY VENOGRAPHY N/A 11/16/2017   Procedure: UPPER EXTREMITY VENOGRAPHY - Right Arm;  Surgeon: Conrad [place], MD;  Location: [facility];  Service: Cardiovascular;  Laterality: N/A;        Home Medications    Prior to Admission medications   Medication Sig Start Date End Date Taking? Authorizing Provider  acetaminophen (TYLENOL) 500 MG tablet Take 1,000 mg by mouth every 6 (six) hours as needed for moderate pain or headache.   Yes [provider]  albuterol (PROVENTIL HFA;VENTOLIN HFA) 108 (90 Base) MCG/ACT inhaler  Inhale 1-2 puffs into the lungs every 6 (six) hours as needed for wheezing or shortness of breath. 12/18/17  Yes Weaver, Scott T, PA-C  amLODipine (NORVASC) 5 MG tablet Take 1 tablet (5 mg total) by mouth daily. 11/27/17 08/19/26 Yes Richardson Dopp T, PA-C  aspirin EC 81 MG tablet Take 81 mg by mouth daily.   Yes [provider]  calcium acetate (PHOSLO) 667 MG capsule Take 2,001 mg by mouth daily with supper.    Yes [provider]  clopidogrel (PLAVIX) 75 MG tablet Take 1 tablet (75 mg total) by mouth daily. 12/18/17  Yes Weaver, Scott T, PA-C  ezetimibe (ZETIA) 10 MG tablet Take 1 tablet (10 mg total) by mouth daily. 04/02/18 08/19/26 Yes Weaver, Scott T, PA-C  gentamicin cream (GARAMYCIN) 0.1 % Apply 1 application topically daily as needed (prior to accessing PD catheter).  09/28/18  Yes [provider]  HYDROcodone-acetaminophen (NORCO/VICODIN) 5-325 MG tablet Take 1 tablet by mouth every 6 (six) hours as needed for moderate pain.   Yes [provider]  isosorbide mononitrate (IMDUR) 60 MG 24 hr tablet Take 2 tablets (120 mg total) by mouth daily. 08/21/18  Yes Hongalgi, Lenis Dickinson, MD  multivitamin (RENA-VIT) TABS tablet Take 1 tablet by mouth daily.   Yes [provider]  repaglinide (PRANDIN) 0.5 MG tablet Take 1 tablet (0.5 mg total) by mouth 2 (two) times daily before a meal. 04/05/18  Yes Renato Shin, MD  rosuvastatin (CRESTOR) 10 MG tablet Take 1 tablet (10 mg total) by mouth daily at 6 PM. Patient taking differently: Take 10 mg by mouth daily.  08/21/18  Yes Hongalgi, Lenis Dickinson, MD    Family History Family History  Problem Relation Age of Onset  . Hyperlipidemia Mother   . Hypertension Mother   . HIV Sister     Social History Social History   Tobacco Use  . Smoking status: Former Smoker    Types: Cigarettes    Last attempt to quit: 07/04/2014    Years since quitting: 4.2  . Smokeless tobacco: Never Used  . Tobacco comment: "smoked ~ 1 pack/month  when I did smoke; never a steady smoker"  Substance Use Topics  . Alcohol use: Yes    Alcohol/week: 0.0 standard drinks    Comment: rare  "2 drinks, 1-2 times/year"  . Drug use: No     Allergies   Iodinated diagnostic agents; Lipitor [atorvastatin]; Metoprolol; and Baclofen   Review of Systems Review of Systems  Constitutional: Positive for appetite change and fatigue. Negative for chills and fever.  HENT: Negative for congestion, sinus pressure, sore throat and trouble swallowing.  Eyes: Negative for visual disturbance.  Respiratory: Positive for shortness of breath. Negative for cough.   Cardiovascular: Negative for chest pain and leg swelling.  Gastrointestinal: Positive for abdominal pain and nausea. Negative for constipation, diarrhea and vomiting.  Musculoskeletal: Negative for back pain, joint swelling, myalgias and neck pain.  Skin: Negative for rash and wound.  Neurological: Negative for dizziness, weakness, light-headedness, numbness and headaches.  All other systems reviewed and are negative.    Physical Exam Updated Vital Signs BP (!) 172/83   Pulse 89   Temp 98.7 F (37.1 C) (Oral)   Resp 19   Ht 5' 6.5" (1.689 m)   Wt 103.4 kg   SpO2 97%   BMI 36.25 kg/m   Physical Exam Vitals signs and nursing note reviewed.  Constitutional:      General: He is not in acute distress.    Appearance: Normal appearance. He is well-developed. He is not ill-appearing.  HENT:     Head: Normocephalic and atraumatic.     Nose: Nose normal. No congestion or rhinorrhea.     Mouth/Throat:     Mouth: Mucous membranes are moist.  Eyes:     Extraocular Movements: Extraocular movements intact.     Pupils: Pupils are equal, round, and reactive to light.  Neck:     Musculoskeletal: Normal range of motion and neck supple. No neck rigidity or muscular tenderness.  Cardiovascular:     Rate and Rhythm: Normal rate and regular rhythm.     Heart sounds: No murmur. No friction rub.  No gallop.   Pulmonary:     Effort: Pulmonary effort is normal. No respiratory distress.     Breath sounds: No stridor. No wheezing, rhonchi or rales.     Comments: Diminished breath sounds in bilateral bases.  Left upper hemodialysis catheter in place.  No erythema, tenderness, swelling around insertion site. Abdominal:     General: Bowel sounds are normal. There is distension.     Palpations: Abdomen is soft.     Tenderness: There is abdominal tenderness. There is no right CVA tenderness, left CVA tenderness, guarding or rebound.     Comments: Abdomen is diffusely distended.  Right upper quadrant tenderness to palpation.  No rebound or guarding.  Surgical wounds appear to be well-healing.  Musculoskeletal: Normal range of motion.        General: No swelling, tenderness, deformity or signs of injury.     Right lower leg: No edema.     Left lower leg: No edema.  Lymphadenopathy:     Cervical: No cervical adenopathy.  Skin:    General: Skin is warm and dry.     Capillary Refill: Capillary refill takes less than 2 seconds.     Findings: No erythema or rash.  Neurological:     General: No focal deficit present.     Mental Status: He is alert and oriented to person, place, and time.     Comments: Moving all extremities without focal deficit.  Sensation intact.  Psychiatric:        Mood and Affect: Mood normal.        Behavior: Behavior normal.      ED Treatments / Results  Labs (all labs ordered are listed, but only abnormal results are displayed) Labs Reviewed  COMPREHENSIVE METABOLIC PANEL - Abnormal; Notable for the following components:      Result Value   Potassium 3.4 (*)    Glucose, Bld 101 (*)    BUN 29 (*)  Creatinine, Ser 10.09 (*)    Albumin 3.3 (*)    GFR calc non Af Amer 5 (*)    GFR calc Af Amer 6 (*)    All other components within normal limits  CBC - Abnormal; Notable for the following components:   RBC 3.40 (*)    Hemoglobin 9.0 (*)    HCT 31.1 (*)     MCHC 28.9 (*)    RDW 17.5 (*)    All other components within normal limits  I-STAT TROPONIN, ED - Abnormal; Notable for the following components:   Troponin i, poc 0.22 (*)    All other components within normal limits  LIPASE, BLOOD  URINALYSIS, ROUTINE W REFLEX MICROSCOPIC  I-STAT TROPONIN, ED    EKG EKG Interpretation  Date/Time:  Friday October 01 2018 18:54:40 EST Ventricular Rate:  103 PR Interval:  156 QRS Duration: 84 QT Interval:  356 QTC Calculation: 466 R Axis:   120 Text Interpretation:  Sinus tachycardia Right axis deviation RSR' or QR pattern in V1 suggests right ventricular conduction delay Nonspecific ST and T wave abnormality Abnormal ECG Confirmed by Julianne Rice [phone]) on 10/01/2018 8:25:07 PM   Radiology Ct Abdomen Pelvis Wo Contrast  Result Date: 10/01/2018 CLINICAL DATA:  Shortness of breath.  Abdominal distention. EXAM: CT ABDOMEN AND PELVIS WITHOUT CONTRAST TECHNIQUE: Multidetector CT imaging of the abdomen and pelvis was performed following the standard protocol without IV contrast. COMPARISON:  None. FINDINGS: Lower chest: Heart is enlarged. Coronary artery calcifications are present. Mitral valve annular calcifications are present. Atherosclerotic changes are noted in the descending aorta. Mild bibasilar airspace disease is present. No significant pleural or pericardial effusion is present. Hepatobiliary: No focal liver abnormality is seen. No gallstones, gallbladder wall thickening, or biliary dilatation. Pancreas: Unremarkable. No pancreatic ductal dilatation or surrounding inflammatory changes. Spleen: Normal in size without focal abnormality. Adrenals/Urinary Tract: Adrenal glands are normal bilaterally. 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 Ureters are within normal limits. The urinary bladder is collapsed. Stomach/Bowel: A stomach and duodenum are within normal limits. Proximal small bowel is unremarkable. There is focal inflammation of bowel loops of  small bowel adjacent to a fluid collection along the anterior peritoneum on the right. The right anterior peritoneal fluid collection measures 12.9 x 6.5 x 14.6 cm. There is no obstruction. Terminal ileum is otherwise normal. The appendix is visualized and normal. The ascending and transverse colon is mostly collapsed. Diverticular changes are present in the descending and sigmoid colon without focal inflammation. Vascular/Lymphatic: Extensive vascular calcifications are present. There is no aneurysm. No significant adenopathy is present. Reproductive: Prostate is unremarkable. Other: Peritoneal dialysis catheter is in place. The anterior wall fluid collection seen on the previous study is stable. This is just anterior to the new collection. Elder collection measures 8.6 x 5.5 x 7.8 cm. There is some inflammatory change the subcutaneous tissues inferiorly. Musculoskeletal: Vertebral body heights and alignment are maintained. No focal lytic or blastic lesions are present. IMPRESSION: 1. New ventral peritoneal fluid collection measures 12.9 x 6.5 x 14.6 cm. This is worrisome for a loculated fluid collection associated with dialysis or developing abscess related to bowel disease. It appears separate from the other anterior abdominal wall fluid collection. 2. The more remote anterior wall fluid collection is stable. 3. Inflammatory changes of bowel adjacent to the fluid collection, likely reflecting adhesions. There is no obstruction. This is in the region of the previous hernia. 4. Dialysis catheter. 5. Aortic Atherosclerosis (ICD10-I70.0). Extensive vascular calcifications are  present without aneurysm. 6. Cystic renal disease of dialysis. 7. Cardiomegaly without failure. 8. Coronary artery disease. Electronically Signed   By: San Morelle M.D.   On: 10/01/2018 21:40   Dg Chest Port 1 View  Result Date: 10/01/2018 CLINICAL DATA:  Dyspnea for the past 2 hours. EXAM: PORTABLE CHEST 1 VIEW COMPARISON:   08/19/2018 FINDINGS: Stable cardiomegaly with aortic atherosclerosis. Dual lumen dialysis catheter terminates in the right atrium unchanged in position. Median sternotomy sutures and post surgical clips project over the cardiac silhouette. No overt pulmonary edema. Hazy appearance at the left lung base may represent a small layering left effusion. No acute osseous abnormality. Vascular clips project over the superior mediastinum as well as a vascular stent projecting over the right upper thorax in the region of the subclavian artery. IMPRESSION: Stable cardiomegaly. Hazy opacity at the left lung base may represent a small layering left effusion, new since prior. No overt pulmonary edema. No other change of note. Electronically Signed   By: Ashley Royalty M.D.   On: 10/01/2018 20:56    Procedures Procedures (including critical care time)  Medications Ordered in ED Medications  sodium chloride flush (NS) 0.9 % injection 3 mL (has no administration in time range)  rosuvastatin (CRESTOR) tablet 10 mg (has no administration in time range)  isosorbide mononitrate (IMDUR) 24 hr tablet 120 mg (has no administration in time range)  multivitamin (RENA-VIT) tablet 1 tablet (has no administration in time range)  repaglinide (PRANDIN) tablet 0.5 mg (has no administration in time range)  calcium acetate (PHOSLO) capsule 2,001 mg (has no administration in time range)  amLODipine (NORVASC) tablet 5 mg (has no administration in time range)  albuterol (PROVENTIL) (2.5 MG/3ML) 0.083% nebulizer solution 3 mL (has no administration in time range)  acetaminophen (TYLENOL) tablet 1,000 mg (has no administration in time range)  ezetimibe (ZETIA) tablet 10 mg (has no administration in time range)     Initial Impression / Assessment and Plan / ED Course  I have reviewed the triage vital signs and the nursing notes.  Pertinent labs & imaging results that were available during my care of the patient were reviewed by me and  considered in my medical decision making (see chart for details).       Spoke with the patient's surgeon, Dr.Teparra.  Recommends IR drainage of fluid collection and antibiotics based on results of that.  Attempted to transfer to [facility] but no beds are available.  Discussed with hospitalist at [facility] who will admit patient for elevated troponin and dyspnea.  General surgery will eval patient in the emergency department.   Final Clinical Impressions(s) / ED Diagnoses   Final diagnoses:  Right upper quadrant abdominal pain  Dyspnea, unspecified type  Elevated troponin    ED Discharge Orders    None       Julianne Rice, MD 10/01/18 2339

## 2018-10-01 NOTE — ED Triage Notes (Addendum)
Reports SOB for the last two hours.  Had PD catheter and hernia repaired on 2/17.  Has not started PD yet but had hemo yesterday.  Reports being nauseated and has little energy.  O2 sats 90% in triage on room air.  Sat came up to 100% with deep breaths and remained in mid 90's.

## 2018-10-01 NOTE — Consult Note (Addendum)
Surgical Consultation Requesting provider: Dr. Lita Mains  CC: abdominal pain, nausea  HPI: 63 year old gentleman with multiple comorbidities as listed below, notable for end-stage renal disease on hemodialysis, who presents to the emergency room this evening complaining of right upper abdominal pain which began this afternoon.  Associated with some shortness of breath with exertion, mild nausea.  Notes normal bowel movements and has been eating fine.    He is status post robotic incarcerated incisional hernia repair with mesh (20x15 coated polypropylene, IPOM technique with primary closure of fascial defect), extensive lysis of adhesions, and peritoneal dialysis catheter placement with omentopexy (RUQ) (09/13/18, Dr. Raul Del).  He actually saw Dr. Raul Del a couple days ago and seemed to be doing fine.    Here in the emergency room, he is noted to have an elevated troponin II 0.22, no leukocytosis, x-ray shows a small layering left effusion which is new, and CT scan of the abdomen and pelvis shows large seroma versus abscess, part of which appears to be above the fascia and part appears to be below the fascia in the right lower quadrant near repair.  There is inflammatory changes of the bowel adjacent to the fluid collection likely reflecting adhesions, no obstruction.  Also noted is a dialysis catheter in appropriate position.  Extensive vascular calcifications and aortic atherosclerosis as well as coronary artery disease.  [facility] is unable to admit the patient as they do not have beds.  He is being admitted to the medical service here and general surgery is asked to consult.  Allergies  Allergen Reactions  . Iodinated Diagnostic Agents Swelling, Rash and Other (See Comments)    Other Reaction: burning to mouth, swelling of lips  . Lipitor [Atorvastatin] Other (See Comments)    Leg pain  . Metoprolol Other (See Comments)    Headaches   . Baclofen Other (See Comments)    Possibly  stroke like symptoms    Past Medical History:  Diagnosis Date  . Arthritis    "back" (09/24/2016)  . CHF (congestive heart failure) ([place])   . Coronary artery disease   . Dysrhythmia    aflutter s/p ablation 2018  . ESRD (end stage renal disease) on dialysis ([name])    Pt on HD 2003 >> transplanted in 2009, back on HD in 2016. [name].   . Gout   . Hernia of abdominal wall   . History of blood transfusion 2009- 2016   "I've had several; low HgB"  . History of kidney stones    treated with nephrectomy  . Hypertension   . Impotence of organic origin   . Migraine    "stopped in my 30's; they were related to high BP" (09/24/2016)  . Myocardial infarction [facility]) '96  . Pneumonia 01/2015   " a touch & I was in the hosp."  . Secondary hyperparathyroidism ([place])   . Type 2 diabetes mellitus (H[facility])    no longer on medication since going on dialysis  . Wears glasses     Past Surgical History:  Procedure Laterality Date  . A-FLUTTER ABLATION N/A 09/24/2016   Procedure: A-Flutter Ablation;  Surgeon: Will Meredith Leeds, MD;  Location: [facility];  Service: Cardiovascular;  Laterality: N/A;  . ABDOMINAL AORTOGRAM W/LOWER EXTREMITY N/A 04/13/2017   Procedure: ABDOMINAL AORTOGRAM W/LOWER EXTREMITY;  Surgeon: Conrad Bullhead City, MD;  Location: [facility];  Service: Cardiovascular;  Laterality: N/A;  Bilater lower extermity  . APPENDECTOMY    . AV  FISTULA PLACEMENT Right 09/18/2014   Procedure: INSERTION OF ARTERIOVENOUS (AV) GORE-TEX GRAFT ARM USING 4-7MM  X 45CM STRETCH GORE-TEX VASCULAR GRAFT;  Surgeon: Rosetta Posner, MD;  Location: [facility];  Service: Vascular;  Laterality: Right;  . AV FISTULA PLACEMENT Left 07/07/2016   Procedure: INSERTION OF LEFT BRACHIAL TO AXILLARY ARTERIOVENOUS (AV) GORE-TEX ARM GRAFT;  Surgeon: Conrad Crisp, MD;  Location: [facility];  Service: Vascular;  Laterality: Left;  . CARDIAC CATHETERIZATION  ~ 2016  . COLONOSCOPY    . 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 EXCHANGE OF A DIALYSIS CATHETER Left 01/11/2018   Procedure: EXCHANGE OF TUNNELED DIALYSIS CATHETER;  Surgeon: Rosetta Posner, MD;  Location: [facility];  Service: Vascular;  Laterality: Left;  . HERNIA REPAIR  2017   with nephrectomy  . INSERTION OF DIALYSIS CATHETER N/A 10/08/2017   Procedure: INSERTION OF TUNNELED DIALYSIS CATHETER;  Surgeon: Conrad Pollock, MD;  Location: [facility];  Service: Vascular;  Laterality: N/A;  . IR FLUORO GUIDE CV LINE LEFT  03/12/2018  . IR GENERIC HISTORICAL  05/11/2016   IR FLUORO GUIDE CV LINE LEFT 05/11/2016 Corrie Mckusick, DO [facility]  . IR GENERIC HISTORICAL  05/11/2016   IR US GUIDE VASC ACCESS LEFT 05/11/2016 Corrie Mckusick, DO [facility]  . IR GENERIC HISTORICAL  05/11/2016   IR US GUIDE VASC ACCESS RIGHT 05/11/2016 Corrie Mckusick, DO [facility]  . IR GENERIC HISTORICAL  05/11/2016   IR RADIOLOGY PERIPHERAL GUIDED IV START 05/11/2016 Corrie Mckusick, DO [facility]  . KIDNEY TRANSPLANT  2009  . LEFT HEART CATH AND CORS/GRAFTS ANGIOGRAPHY N/A 12/04/2017   Procedure: LEFT HEART CATH AND CORS/GRAFTS ANGIOGRAPHY;  Surgeon: Belva Crome, MD;  Location: [facility];  Service: Cardiovascular;  Laterality: N/A;  . NEPHRECTOMY  2017   transplant rejected   . PERIPHERAL VASCULAR CATHETERIZATION N/A 06/04/2016   Procedure: Upper Extremity Venography;  Surgeon: Waynetta Sandy, MD;  Location: [facility];  Service: Cardiovascular;  Laterality: N/A;  . PERIPHERAL VASCULAR INTERVENTION  04/13/2017   Procedure: PERIPHERAL VASCULAR INTERVENTION;  Surgeon: Conrad Blairsburg, MD;  Location: [facility];  Service: Cardiovascular;;  Lt. Common/Exernal  Iliac  . THROMBECTOMY AND REVISION OF ARTERIOVENTOUS (AV) GORETEX  GRAFT Left 10/08/2017   Procedure: THROMBECTOMY of ARTERIOVENTOUS (AV) GORETEX  GRAFT LEFT UPPER ARM;  Surgeon: Conrad Oakford, MD;  Location: [facility];   Service: Vascular;  Laterality: Left;  . THROMBECTOMY W/ EMBOLECTOMY Left 09/14/2017   Procedure: THROMBECTOMY ARTERIOVENOUS GORE-TEX GRAFT LEFT UPPER ARM;  Surgeon: Rosetta Posner, MD;  Location: [facility];  Service: Vascular;  Laterality: Left;  . UPPER EXTREMITY VENOGRAPHY N/A 11/16/2017   Procedure: UPPER EXTREMITY VENOGRAPHY - Right Arm;  Surgeon: Conrad Oak City, MD;  Location: [facility];  Service: Cardiovascular;  Laterality: N/A;    Family History  Problem Relation Age of Onset  . Hyperlipidemia Mother   . Hypertension Mother   . HIV Sister     Social History   Socioeconomic History  . Marital status: Married    Spouse name: SHEILA  . Number of children: 5  . Years of education: 24  . Highest education level: 12th grade  Occupational History  . Occupation: DISABLED  Social Needs  . Financial resource strain: Not on file  . Food insecurity:    Worry: Never true  Inability: Never true  . Transportation needs:    Medical: No    Non-medical: No  Tobacco Use  . Smoking status: Former Smoker    Types: Cigarettes    Last attempt to quit: 07/04/2014    Years since quitting: 4.2  . Smokeless tobacco: Never Used  . Tobacco comment: "smoked ~ 1 pack/month when I did smoke; never a steady smoker"  Substance and Sexual Activity  . Alcohol use: Yes    Alcohol/week: 0.0 standard drinks    Comment: rare  "2 drinks, 1-2 times/year"  . Drug use: No  . Sexual activity: Not Currently    Birth control/protection: None  Lifestyle  . Physical activity:    Days per week: 0 days    Minutes per session: 0 min  . Stress: Not at all  Relationships  . Social connections:    Talks on phone: More than three times a week    Gets together: More than three times a week    Attends religious service: Never    Active member of club or organization: No    Attends meetings of clubs or organizations: Never    Relationship status: Married  Other Topics Concern  . Not on file  Social  History Narrative  . Not on file    No current facility-administered medications on file prior to encounter.    Current Outpatient Medications on File Prior to Encounter  Medication Sig Dispense Refill  . acetaminophen (TYLENOL) 500 MG tablet Take 1,000 mg by mouth every 6 (six) hours as needed for moderate pain or headache.    . albuterol (PROVENTIL HFA;VENTOLIN HFA) 108 (90 Base) MCG/ACT inhaler Inhale 1-2 puffs into the lungs every 6 (six) hours as needed for wheezing or shortness of breath. 1 Inhaler 0  . amLODipine (NORVASC) 5 MG tablet Take 1 tablet (5 mg total) by mouth daily. 90 tablet 3  . aspirin EC 81 MG tablet Take 81 mg by mouth daily.    . calcium acetate (PHOSLO) 667 MG capsule Take 2,001 mg by mouth daily with supper.     . clopidogrel (PLAVIX) 75 MG tablet Take 1 tablet (75 mg total) by mouth daily. 90 tablet 3  . ezetimibe (ZETIA) 10 MG tablet Take 1 tablet (10 mg total) by mouth daily. 90 tablet 3  . gentamicin cream (GARAMYCIN) 0.1 % Apply 1 application topically daily as needed (prior to accessing PD catheter).     [place] HYDROcodone-acetaminophen (NORCO/VICODIN) 5-325 MG tablet Take 1 tablet by mouth every 6 (six) hours as needed for moderate pain.    . isosorbide mononitrate (IMDUR) 60 MG 24 hr tablet Take 2 tablets (120 mg total) by mouth daily. 60 tablet 0  . multivitamin (RENA-VIT) TABS tablet Take 1 tablet by mouth daily.    . repaglinide (PRANDIN) 0.5 MG tablet Take 1 tablet (0.5 mg total) by mouth 2 (two) times daily before a meal. 180 tablet 3  . rosuvastatin (CRESTOR) 10 MG tablet Take 1 tablet (10 mg total) by mouth daily at 6 PM. (Patient taking differently: Take 10 mg by mouth daily. ) 30 tablet 0    Review of Systems: a complete, 10pt review of systems was completed with pertinent positives and negatives as documented in the HPI  Physical Exam: Vitals:   10/01/18 2030 10/01/18 2100  BP: 139/81 (!) 167/96  Pulse: 97   Resp: (!) 23 (!) 28  Temp:    SpO2:  100%    Gen: A&Ox3, is restless but  in no acute distress Head: normocephalic, atraumatic Eyes: extraocular motions intact, anicteric.  Neck: supple without mass or thyromegaly Chest: unlabored respirations, symmetrical air entry; hemodialysis catheter in the left chest Cardiovascular: RRR, hypertensive Abdomen: obese, soft, nondistended, nontender.  No rebound or guarding, no peritonitis healing incisions from robotic surgery.  There is a palpable subcutaneous mass in the region of the hernia repair in the right lower quadrant consistent with the fluid collection noted on CT scan. Extremities: warm, without edema, no deformities  Neuro: grossly intact Psych: appropriate mood and affect, normal insight Skin: warm and dry   CBC Latest Ref Rng & Units 10/01/2018 08/21/2018 08/19/2018  WBC 4.0 - 10.5 K/uL 6.9 6.3 6.6  Hemoglobin 13.0 - 17.0 g/dL 9.0(L) 11.0(L) 12.5(L)  Hematocrit 39.0 - 52.0 % 31.1(L) 37.2(L) 41.8  Platelets 150 - 400 K/uL 195 156 176    CMP Latest Ref Rng & Units 10/01/2018 08/21/2018 08/19/2018  Glucose 70 - 99 mg/dL 101(H) 118(H) 148(H)  BUN 8 - 23 mg/dL 29(H) 47(H) 24(H)  Creatinine 0.61 - 1.24 mg/dL 10.09(H) 14.80(H) 10.12(H)  Sodium 135 - 145 mmol/L 139 135 137  Potassium 3.5 - 5.1 mmol/L 3.4(L) 5.2(H) 4.9  Chloride 98 - 111 mmol/L 103 94(L) 99  CO2 22 - 32 mmol/L 23 24 22   Calcium 8.9 - 10.3 mg/dL 9.7 9.3 10.5(H)  Total Protein 6.5 - 8.1 g/dL 7.1 - 8.6(H)  Total Bilirubin 0.3 - 1.2 mg/dL 0.7 - 0.5  Alkaline Phos 38 - 126 U/L 70 - 63  AST 15 - 41 U/L 20 - 19  ALT 0 - 44 U/L 11 - 10    Lab Results  Component Value Date   INR 1.06 08/19/2018   INR 1.89 09/24/2016   INR 1.30 07/07/2016    Imaging: Ct Abdomen Pelvis Wo Contrast  Result Date: 10/01/2018 CLINICAL DATA:  Shortness of breath.  Abdominal distention. EXAM: CT ABDOMEN AND PELVIS WITHOUT CONTRAST TECHNIQUE: Multidetector CT imaging of the abdomen and pelvis was performed following the standard protocol  without IV contrast. COMPARISON:  None. FINDINGS: Lower chest: Heart is enlarged. Coronary artery calcifications are present. Mitral valve annular calcifications are present. Atherosclerotic changes are noted in the descending aorta. Mild bibasilar airspace disease is present. No significant pleural or pericardial effusion is present. Hepatobiliary: No focal liver abnormality is seen. No gallstones, gallbladder wall thickening, or biliary dilatation. Pancreas: Unremarkable. No pancreatic ductal dilatation or surrounding inflammatory changes. Spleen: Normal in size without focal abnormality. Adrenals/Urinary Tract: Adrenal glands are normal bilaterally. 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 Ureters are within normal limits. The urinary bladder is collapsed. Stomach/Bowel: A stomach and duodenum are within normal limits. Proximal small bowel is unremarkable. There is focal inflammation of bowel loops of small bowel adjacent to a fluid collection along the anterior peritoneum on the right. The right anterior peritoneal fluid collection measures 12.9 x 6.5 x 14.6 cm. There is no obstruction. Terminal ileum is otherwise normal. The appendix is visualized and normal. The ascending and transverse colon is mostly collapsed. Diverticular changes are present in the descending and sigmoid colon without focal inflammation. Vascular/Lymphatic: Extensive vascular calcifications are present. There is no aneurysm. No significant adenopathy is present. Reproductive: Prostate is unremarkable. Other: Peritoneal dialysis catheter is in place. The anterior wall fluid collection seen on the previous study is stable. This is just anterior to the new collection. Elder collection measures 8.6 x 5.5 x 7.8 cm. There is some inflammatory change the subcutaneous tissues inferiorly. Musculoskeletal: Vertebral  body heights and alignment are maintained. No focal lytic or blastic lesions are present. IMPRESSION: 1. New ventral peritoneal  fluid collection measures 12.9 x 6.5 x 14.6 cm. This is worrisome for a loculated fluid collection associated with dialysis or developing abscess related to bowel disease. It appears separate from the other anterior abdominal wall fluid collection. 2. The more remote anterior wall fluid collection is stable. 3. Inflammatory changes of bowel adjacent to the fluid collection, likely reflecting adhesions. There is no obstruction. This is in the region of the previous hernia. 4. Dialysis catheter. 5. Aortic Atherosclerosis (ICD10-I70.0). Extensive vascular calcifications are present without aneurysm. 6. Cystic renal disease of dialysis. 7. Cardiomegaly without failure. 8. Coronary artery disease. Electronically Signed   By: San Morelle M.D.   On: 10/01/2018 21:40   Dg Chest Port 1 View  Result Date: 10/01/2018 CLINICAL DATA:  Dyspnea for the past 2 hours. EXAM: PORTABLE CHEST 1 VIEW COMPARISON:  08/19/2018 FINDINGS: Stable cardiomegaly with aortic atherosclerosis. Dual lumen dialysis catheter terminates in the right atrium unchanged in position. Median sternotomy sutures and post surgical clips project over the cardiac silhouette. No overt pulmonary edema. Hazy appearance at the left lung base may represent a small layering left effusion. No acute osseous abnormality. Vascular clips project over the superior mediastinum as well as a vascular stent projecting over the right upper thorax in the region of the subclavian artery. IMPRESSION: Stable cardiomegaly. Hazy opacity at the left lung base may represent a small layering left effusion, new since prior. No overt pulmonary edema. No other change of note. Electronically Signed   By: Ashley Royalty M.D.   On: 10/01/2018 20:56     A/P: 63 year old gentleman who is about 2-1/2 weeks status post robotic repair of right lower quadrant incisional hernia with extensive adhesio lysis and placement of a peritoneal dialysis catheter.  He is being admitted for further  evaluation of his elevated troponin and continuation of hemodialysis.  Recommend consultation to IR (this can be done in the morning) for percutaneous aspiration of both the superficial and the deeper fluid collections to be sent for culture.  Our team will follow while he is admitted; ultimately he will follow-up with Dr. Raul Del.   Romana Juniper, MD [facility], [place] Pager [phone]

## 2018-10-01 NOTE — ED Notes (Signed)
Dr. Lita Mains and charge RN notified of Trop 0.22.

## 2018-10-02 ENCOUNTER — Encounter (HOSPITAL_COMMUNITY): Payer: Self-pay | Admitting: Cardiology

## 2018-10-02 ENCOUNTER — Observation Stay (HOSPITAL_COMMUNITY): Payer: Medicare Other

## 2018-10-02 DIAGNOSIS — I132 Hypertensive heart and chronic kidney disease with heart failure and with stage 5 chronic kidney disease, or end stage renal disease: Secondary | ICD-10-CM | POA: Diagnosis not present

## 2018-10-02 DIAGNOSIS — I25119 Atherosclerotic heart disease of native coronary artery with unspecified angina pectoris: Secondary | ICD-10-CM

## 2018-10-02 DIAGNOSIS — R7989 Other specified abnormal findings of blood chemistry: Secondary | ICD-10-CM

## 2018-10-02 DIAGNOSIS — L7634 Postprocedural seroma of skin and subcutaneous tissue following other procedure: Secondary | ICD-10-CM | POA: Diagnosis not present

## 2018-10-02 DIAGNOSIS — T8612 Kidney transplant failure: Secondary | ICD-10-CM | POA: Diagnosis not present

## 2018-10-02 DIAGNOSIS — I428 Other cardiomyopathies: Secondary | ICD-10-CM | POA: Diagnosis not present

## 2018-10-02 DIAGNOSIS — I10 Essential (primary) hypertension: Secondary | ICD-10-CM

## 2018-10-02 DIAGNOSIS — E1122 Type 2 diabetes mellitus with diabetic chronic kidney disease: Secondary | ICD-10-CM | POA: Diagnosis not present

## 2018-10-02 DIAGNOSIS — E118 Type 2 diabetes mellitus with unspecified complications: Secondary | ICD-10-CM | POA: Diagnosis not present

## 2018-10-02 DIAGNOSIS — N186 End stage renal disease: Secondary | ICD-10-CM | POA: Diagnosis not present

## 2018-10-02 DIAGNOSIS — R0600 Dyspnea, unspecified: Secondary | ICD-10-CM

## 2018-10-02 DIAGNOSIS — I120 Hypertensive chronic kidney disease with stage 5 chronic kidney disease or end stage renal disease: Secondary | ICD-10-CM | POA: Diagnosis not present

## 2018-10-02 DIAGNOSIS — R188 Other ascites: Secondary | ICD-10-CM | POA: Diagnosis not present

## 2018-10-02 DIAGNOSIS — D631 Anemia in chronic kidney disease: Secondary | ICD-10-CM | POA: Diagnosis not present

## 2018-10-02 DIAGNOSIS — Z992 Dependence on renal dialysis: Secondary | ICD-10-CM | POA: Diagnosis not present

## 2018-10-02 DIAGNOSIS — I5032 Chronic diastolic (congestive) heart failure: Secondary | ICD-10-CM | POA: Diagnosis not present

## 2018-10-02 DIAGNOSIS — Z940 Kidney transplant status: Secondary | ICD-10-CM | POA: Diagnosis not present

## 2018-10-02 DIAGNOSIS — R079 Chest pain, unspecified: Secondary | ICD-10-CM

## 2018-10-02 DIAGNOSIS — R0602 Shortness of breath: Secondary | ICD-10-CM

## 2018-10-02 DIAGNOSIS — I4892 Unspecified atrial flutter: Secondary | ICD-10-CM | POA: Diagnosis not present

## 2018-10-02 DIAGNOSIS — L02211 Cutaneous abscess of abdominal wall: Secondary | ICD-10-CM | POA: Diagnosis not present

## 2018-10-02 DIAGNOSIS — N2581 Secondary hyperparathyroidism of renal origin: Secondary | ICD-10-CM | POA: Diagnosis not present

## 2018-10-02 DIAGNOSIS — K651 Peritoneal abscess: Secondary | ICD-10-CM | POA: Diagnosis not present

## 2018-10-02 LAB — CBC
HCT: 25.4 % — ABNORMAL LOW (ref 39.0–52.0)
Hemoglobin: 7.7 g/dL — ABNORMAL LOW (ref 13.0–17.0)
MCH: 27.1 pg (ref 26.0–34.0)
MCHC: 30.3 g/dL (ref 30.0–36.0)
MCV: 89.4 fL (ref 80.0–100.0)
Platelets: 175 10*3/uL (ref 150–400)
RBC: 2.84 MIL/uL — ABNORMAL LOW (ref 4.22–5.81)
RDW: 17.7 % — ABNORMAL HIGH (ref 11.5–15.5)
WBC: 5.8 10*3/uL (ref 4.0–10.5)
nRBC: 0 % (ref 0.0–0.2)

## 2018-10-02 LAB — BASIC METABOLIC PANEL
Anion gap: 13 (ref 5–15)
BUN: 33 mg/dL — ABNORMAL HIGH (ref 8–23)
CHLORIDE: 103 mmol/L (ref 98–111)
CO2: 23 mmol/L (ref 22–32)
Calcium: 8.9 mg/dL (ref 8.9–10.3)
Creatinine, Ser: 11.13 mg/dL — ABNORMAL HIGH (ref 0.61–1.24)
GFR calc Af Amer: 5 mL/min — ABNORMAL LOW (ref >60)
GFR calc non Af Amer: 4 mL/min — ABNORMAL LOW (ref >60)
Glucose, Bld: 87 mg/dL (ref 70–99)
Potassium: 3.8 mmol/L (ref 3.5–5.1)
Sodium: 139 mmol/L (ref 135–145)

## 2018-10-02 LAB — I-STAT TROPONIN, ED: Troponin i, poc: 0.36 ng/mL (ref 0.00–0.08)

## 2018-10-02 LAB — IRON AND TIBC
Iron: 24 ug/dL — ABNORMAL LOW (ref 45–182)
Saturation Ratios: 16 % — ABNORMAL LOW (ref 17.9–39.5)
TIBC: 148 ug/dL — ABNORMAL LOW (ref 250–450)
UIBC: 124 ug/dL

## 2018-10-02 LAB — MRSA PCR SCREENING: MRSA by PCR: NEGATIVE

## 2018-10-02 LAB — PREPARE RBC (CROSSMATCH)

## 2018-10-02 LAB — TROPONIN I
TROPONIN I: 0.32 ng/mL — AB (ref <0.03)
Troponin I: 0.43 ng/mL (ref <0.03)

## 2018-10-02 LAB — GLUCOSE, CAPILLARY
Glucose-Capillary: 77 mg/dL (ref 70–99)
Glucose-Capillary: 62 mg/dL — ABNORMAL LOW (ref 70–99)
Glucose-Capillary: 96 mg/dL (ref 70–99)

## 2018-10-02 LAB — BRAIN NATRIURETIC PEPTIDE: B Natriuretic Peptide: 3561.2 pg/mL — ABNORMAL HIGH (ref 0.0–100.0)

## 2018-10-02 LAB — FERRITIN: Ferritin: 1319 ng/mL — ABNORMAL HIGH (ref 24–336)

## 2018-10-02 LAB — FOLATE: Folate: 9.5 ng/mL (ref >5.9)

## 2018-10-02 MED ORDER — TRAMADOL HCL 50 MG PO TABS
ORAL_TABLET | ORAL | Status: AC
  Filled 2018-10-02: qty 1

## 2018-10-02 MED ORDER — PENTAFLUOROPROP-TETRAFLUOROETH EX AERO: 1.0000 "application " | INHALATION_SPRAY | CUTANEOUS | Status: DC | PRN

## 2018-10-02 MED ORDER — HEPARIN SODIUM (PORCINE) 1000 UNIT/ML DIALYSIS: 1000.0000 [IU] | INTRAMUSCULAR | Status: DC | PRN

## 2018-10-02 MED ORDER — ISOSORBIDE MONONITRATE ER 30 MG PO TB24
120.0000 mg | ORAL_TABLET | Freq: Every day | ORAL | Status: DC
  Administered 2018-10-02 – 2018-10-05 (×4): 120 mg via ORAL
  Filled 2018-10-02 (×5): qty 4

## 2018-10-02 MED ORDER — LIDOCAINE HCL (PF) 1 % IJ SOLN: 5.0000 mL | INTRAMUSCULAR | Status: DC | PRN

## 2018-10-02 MED ORDER — FENTANYL CITRATE (PF) 100 MCG/2ML IJ SOLN
INTRAMUSCULAR | Status: AC
  Filled 2018-10-02: qty 2

## 2018-10-02 MED ORDER — PNEUMOCOCCAL VAC POLYVALENT 25 MCG/0.5ML IJ INJ: 0.5000 mL | INJECTION | INTRAMUSCULAR | Status: DC

## 2018-10-02 MED ORDER — SODIUM CHLORIDE 0.9% FLUSH
5.0000 mL | Freq: Three times a day (TID) | INTRAVENOUS | Status: DC
  Administered 2018-10-02 – 2018-10-05 (×5): 5 mL

## 2018-10-02 MED ORDER — ALTEPLASE 2 MG IJ SOLR: 2.0000 mg | Freq: Once | INTRAMUSCULAR | Status: DC | PRN

## 2018-10-02 MED ORDER — HEPARIN SODIUM (PORCINE) 1000 UNIT/ML IJ SOLN
INTRAMUSCULAR | Status: AC
  Filled 2018-10-02: qty 5

## 2018-10-02 MED ORDER — HEPARIN SODIUM (PORCINE) 1000 UNIT/ML DIALYSIS: 3000.0000 [IU] | INTRAMUSCULAR | Status: DC | PRN

## 2018-10-02 MED ORDER — LIDOCAINE-PRILOCAINE 2.5-2.5 % EX CREA: 1.0000 "application " | TOPICAL_CREAM | CUTANEOUS | Status: DC | PRN

## 2018-10-02 MED ORDER — NEPRO/CARBSTEADY PO LIQD
237.0000 mL | Freq: Every day | ORAL | Status: DC
  Administered 2018-10-03: 237 mL via ORAL
  Filled 2018-10-02 (×4): qty 237

## 2018-10-02 MED ORDER — DIPHENHYDRAMINE HCL 25 MG PO CAPS
ORAL_CAPSULE | ORAL | Status: AC
  Filled 2018-10-02: qty 1

## 2018-10-02 MED ORDER — LIDOCAINE HCL 1 % IJ SOLN
INTRAMUSCULAR | Status: AC
  Filled 2018-10-02: qty 20

## 2018-10-02 MED ORDER — BISOPROLOL FUMARATE 5 MG PO TABS
5.0000 mg | ORAL_TABLET | Freq: Every day | ORAL | Status: DC
  Administered 2018-10-03 – 2018-10-04 (×2): 5 mg via ORAL
  Filled 2018-10-02 (×3): qty 1

## 2018-10-02 MED ORDER — DIPHENHYDRAMINE HCL 25 MG PO CAPS
25.0000 mg | ORAL_CAPSULE | Freq: Once | ORAL | Status: DC
  Filled 2018-10-02: qty 1

## 2018-10-02 MED ORDER — MIDAZOLAM HCL 2 MG/2ML IJ SOLN
INTRAMUSCULAR | Status: AC | PRN
  Administered 2018-10-02 (×2): 1 mg via INTRAVENOUS

## 2018-10-02 MED ORDER — SODIUM CHLORIDE 0.9 % IV SOLN: 100.0000 mL | INTRAVENOUS | Status: DC | PRN

## 2018-10-02 MED ORDER — HEPARIN SODIUM (PORCINE) 1000 UNIT/ML IJ SOLN
INTRAMUSCULAR | Status: AC
  Administered 2018-10-02: 3000 [IU]
  Filled 2018-10-02: qty 3

## 2018-10-02 MED ORDER — SODIUM CHLORIDE 0.9% IV SOLUTION: Freq: Once | INTRAVENOUS | Status: DC

## 2018-10-02 MED ORDER — VANCOMYCIN HCL 10 G IV SOLR
2000.0000 mg | Freq: Once | INTRAVENOUS | Status: AC
  Administered 2018-10-02: 2000 mg via INTRAVENOUS
  Filled 2018-10-02: qty 2000

## 2018-10-02 MED ORDER — MIDAZOLAM HCL 2 MG/2ML IJ SOLN
INTRAMUSCULAR | Status: AC
  Filled 2018-10-02: qty 2

## 2018-10-02 MED ORDER — FENTANYL CITRATE (PF) 100 MCG/2ML IJ SOLN
INTRAMUSCULAR | Status: AC | PRN
  Administered 2018-10-02 (×2): 50 ug via INTRAVENOUS

## 2018-10-02 MED ORDER — VANCOMYCIN HCL IN DEXTROSE 1-5 GM/200ML-% IV SOLN: 1000.0000 mg | INTRAVENOUS | Status: DC

## 2018-10-02 MED ORDER — CHLORHEXIDINE GLUCONATE CLOTH 2 % EX PADS
6.0000 | MEDICATED_PAD | Freq: Every day | CUTANEOUS | Status: DC
  Administered 2018-10-03 – 2018-10-05 (×3): 6 via TOPICAL

## 2018-10-02 MED ORDER — DIPHENHYDRAMINE HCL 50 MG/ML IJ SOLN
INTRAMUSCULAR | Status: AC
  Filled 2018-10-02: qty 1

## 2018-10-02 MED ORDER — SENNOSIDES-DOCUSATE SODIUM 8.6-50 MG PO TABS
1.0000 | ORAL_TABLET | Freq: Every evening | ORAL | Status: DC | PRN
  Administered 2018-10-02 – 2018-10-04 (×2): 1 via ORAL
  Filled 2018-10-02 (×2): qty 1

## 2018-10-02 MED ORDER — ACETAMINOPHEN 325 MG PO TABS
650.0000 mg | ORAL_TABLET | Freq: Once | ORAL | Status: DC
  Filled 2018-10-02: qty 2

## 2018-10-02 NOTE — ED Notes (Signed)
IV nurse at bedside,  She will try to get 1st set of blood cultures.

## 2018-10-02 NOTE — Progress Notes (Signed)
Pharmacy Antibiotic Note  Patrick [name] is a 63 y.o. male admitted on 10/01/2018 with post-op infection w/ peritoneal abscess vs seroma.  Pharmacy has been consulted for vancomycin dosing.  Pt is 2.5 weeks s/p robotic repair of right lower quadrant incisional hernia with extensive adhesion lysis and placement of a peritoneal dialysis catheter; pt is still on HD.  Plan: Vancomycin 2000mg  x1 then 1000mg  IV every HD.  Goal pre-HD level 15-25 mcg/mL.  Height: 5' 6.5" (168.9 cm) Weight: 228 lb (103.4 kg) IBW/kg (Calculated) : 64.95  Temp (24hrs), Avg:98.8 F (37.1 C), Min:98.7 F (37.1 C), Max:98.9 F (37.2 C)  Recent Labs  Lab 10/01/18 1927  WBC 6.9  CREATININE 10.09*    Estimated Creatinine Clearance: 8.4 mL/min (A) (by C-G formula based on SCr of 10.09 mg/dL (H)).    Allergies  Allergen Reactions  . Iodinated Diagnostic Agents Swelling, Rash and Other (See Comments)    Other Reaction: burning to mouth, swelling of lips  . Lipitor [Atorvastatin] Other (See Comments)    Leg pain  . Metoprolol Other (See Comments)    Headaches   . Baclofen Other (See Comments)    Possibly stroke like symptoms    Thank you for allowing pharmacy to be a part of this patient's care.  Wynona Neat, PharmD, BCPS  10/02/2018 12:40 AM

## 2018-10-02 NOTE — Progress Notes (Signed)
[facility]/Trauma Progress Note      Assessment/Plan Principal Problem:   Peritoneal abscess ([place]) Active Problems:   Essential hypertension   Elevated troponin   Controlled diabetes mellitus type 2 with complications (HCC)   History of MI (myocardial infarction)   ESRD (end stage renal disease) (HCC)  Elevated troponins - per medicine  S/P robotic incarcerated incisional hernia repair with mesh, extensive LOA, and peritoneal dialysis catheter placement with omentopexy (RUQ) (09/13/18, Dr. Raul Del) - CT scan abd/pel shows large seroma versus abscess, part of which appears to be above the fascia and part appears to be below the fascia in the right lower quadrant near repair.  There is inflammatory changes of the bowel adjacent to the fluid collection likely reflecting adhesions, no obstruction - IR to evaluate for possible drainage, no surgical intervention indicated at this time - WBC WNL, VSS, afebrile - we will follow while he is admitted; ultimately he will follow-up with Dr. Raul Del.  FEN: NPO VTE: SCD's, per medicine ID: Vanc & Rocephin 03/06>> Follow up: Dr. Raul Del     LOS: 0 days    Subjective: CC: abdominal seroma vs abscess  No abdominal pain today. No nausea or vomiting. He is having flatus. No fever or chills overnight. Explained to pt why we are following him.   Objective: Vital signs in last 24 hours: Temp:  [97.9 F (36.6 C)-98.9 F (37.2 C)] 97.9 F (36.6 C) (03/07 0808) Pulse Rate:  [89-102] 96 (03/07 0808) Resp:  [17-29] 17 (03/07 0808) BP: (139-192)/(73-106) 142/91 (03/07 0808) SpO2:  [92 %-100 %] 92 % (03/07 0808) Weight:  [101.1 kg-103.4 kg] 101.1 kg (03/07 0100)    Intake/Output from previous day: 03/06 0701 - 03/07 0700 In: 354.5 [IV Piggyback:354.5] Out: -  Intake/Output this shift: No intake/output data recorded.  PE: Gen:  Alert, NAD, pleasant, cooperative Pulm:  Rate and effort normal Abd: obese, soft, nondistended,  nontender.  No rebound or guarding, no peritonitis healing incisions from robotic surgery.  There is a palpable subcutaneous mass in the region of the hernia repair in the right lower quadrant consistent with the fluid collection noted on CT scan. Skin: warm and dry   Anti-infectives: Anti-infectives (From admission, onward)   Start     Dose/Rate Route Frequency Ordered Stop   10/02/18 1200  vancomycin (VANCOCIN) IVPB 1000 mg/200 mL premix     1,000 mg 200 mL/hr over 60 Minutes Intravenous Every T-Th-Sa (Hemodialysis) 10/02/18 0037     10/02/18 0100  vancomycin (VANCOCIN) 2,000 mg in sodium chloride 0.9 % 500 mL IVPB     2,000 mg 250 mL/hr over 120 Minutes Intravenous  Once 10/02/18 0037 10/02/18 0334   10/02/18 0000  cefTRIAXone (ROCEPHIN) 2 g in sodium chloride 0.9 % 100 mL IVPB     2 g 200 mL/hr over 30 Minutes Intravenous Every 24 hours 10/01/18 2338        Lab Results:  Recent Labs    10/01/18 1927 10/02/18 0702  WBC 6.9 5.8  HGB 9.0* 7.7*  HCT 31.1* 25.4*  PLT 195 175   BMET Recent Labs    10/01/18 1927 10/02/18 0702  NA 139 139  K 3.4* 3.8  CL 103 103  CO2 23 23  GLUCOSE 101* 87  BUN 29* 33*  CREATININE 10.09* 11.13*  CALCIUM 9.7 8.9   PT/INR No results for input(s): LABPROT, INR in the last 72 hours. CMP     Component Value Date/Time   NA 139 10/02/2018 0702  NA 139 11/30/2017 1008   K 3.8 10/02/2018 0702   CL 103 10/02/2018 0702   CO2 23 10/02/2018 0702   GLUCOSE 87 10/02/2018 0702   BUN 33 (H) 10/02/2018 0702   BUN 48 (H) 11/30/2017 1008   CREATININE 11.13 (H) 10/02/2018 0702   CALCIUM 8.9 10/02/2018 0702   PROT 7.1 10/01/2018 1927   PROT 7.3 07/02/2018 0735   ALBUMIN 3.3 (L) 10/01/2018 1927   ALBUMIN 4.5 07/02/2018 0735   AST 20 10/01/2018 1927   ALT 11 10/01/2018 1927   ALKPHOS 70 10/01/2018 1927   BILITOT 0.7 10/01/2018 1927   BILITOT 0.3 07/02/2018 0735   GFRNONAA 4 (L) 10/02/2018 0702   GFRAA 5 (L) 10/02/2018 0702   Lipase      Component Value Date/Time   LIPASE 41 10/01/2018 1927    Studies/Results: Ct Abdomen Pelvis Wo Contrast  Result Date: 10/01/2018 CLINICAL DATA:  Shortness of breath.  Abdominal distention. EXAM: CT ABDOMEN AND PELVIS WITHOUT CONTRAST TECHNIQUE: Multidetector CT imaging of the abdomen and pelvis was performed following the standard protocol without IV contrast. COMPARISON:  None. FINDINGS: Lower chest: Heart is enlarged. Coronary artery calcifications are present. Mitral valve annular calcifications are present. Atherosclerotic changes are noted in the descending aorta. Mild bibasilar airspace disease is present. No significant pleural or pericardial effusion is present. Hepatobiliary: No focal liver abnormality is seen. No gallstones, gallbladder wall thickening, or biliary dilatation. Pancreas: Unremarkable. No pancreatic ductal dilatation or surrounding inflammatory changes. Spleen: Normal in size without focal abnormality. Adrenals/Urinary Tract: Adrenal glands are normal bilaterally. 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 Ureters are within normal limits. The urinary bladder is collapsed. Stomach/Bowel: A stomach and duodenum are within normal limits. Proximal small bowel is unremarkable. There is focal inflammation of bowel loops of small bowel adjacent to a fluid collection along the anterior peritoneum on the right. The right anterior peritoneal fluid collection measures 12.9 x 6.5 x 14.6 cm. There is no obstruction. Terminal ileum is otherwise normal. The appendix is visualized and normal. The ascending and transverse colon is mostly collapsed. Diverticular changes are present in the descending and sigmoid colon without focal inflammation. Vascular/Lymphatic: Extensive vascular calcifications are present. There is no aneurysm. No significant adenopathy is present. Reproductive: Prostate is unremarkable. Other: Peritoneal dialysis catheter is in place. The anterior wall fluid collection seen on  the previous study is stable. This is just anterior to the new collection. Elder collection measures 8.6 x 5.5 x 7.8 cm. There is some inflammatory change the subcutaneous tissues inferiorly. Musculoskeletal: Vertebral body heights and alignment are maintained. No focal lytic or blastic lesions are present. IMPRESSION: 1. New ventral peritoneal fluid collection measures 12.9 x 6.5 x 14.6 cm. This is worrisome for a loculated fluid collection associated with dialysis or developing abscess related to bowel disease. It appears separate from the other anterior abdominal wall fluid collection. 2. The more remote anterior wall fluid collection is stable. 3. Inflammatory changes of bowel adjacent to the fluid collection, likely reflecting adhesions. There is no obstruction. This is in the region of the previous hernia. 4. Dialysis catheter. 5. Aortic Atherosclerosis (ICD10-I70.0). Extensive vascular calcifications are present without aneurysm. 6. Cystic renal disease of dialysis. 7. Cardiomegaly without failure. 8. Coronary artery disease. Electronically Signed   By: San Morelle M.D.   On: 10/01/2018 21:40   Dg Chest Port 1 View  Result Date: 10/01/2018 CLINICAL DATA:  Dyspnea for the past 2 hours. EXAM: PORTABLE CHEST 1 VIEW COMPARISON:  08/19/2018 FINDINGS: Stable cardiomegaly with aortic atherosclerosis. Dual lumen dialysis catheter terminates in the right atrium unchanged in position. Median sternotomy sutures and post surgical clips project over the cardiac silhouette. No overt pulmonary edema. Hazy appearance at the left lung base may represent a small layering left effusion. No acute osseous abnormality. Vascular clips project over the superior mediastinum as well as a vascular stent projecting over the right upper thorax in the region of the subclavian artery. IMPRESSION: Stable cardiomegaly. Hazy opacity at the left lung base may represent a small layering left effusion, new since prior. No overt  pulmonary edema. No other change of note. Electronically Signed   By: Ashley Royalty M.D.   On: 10/01/2018 20:56      Kalman Drape , [facility] Surgery 10/02/2018, 9:09 AM  Pager: [phone] Mon-Wed, Friday 7:00am-4:30pm Thurs 7am-11:30am  Consults: [phone]

## 2018-10-02 NOTE — Progress Notes (Signed)
PROGRESS NOTE    Patrick [name]  MGQ:[id number] DOB: 1955-12-24 DOA: 10/01/2018 PCP: Nolene Ebbs, MD    Brief Narrative:  Patrick [name] is a 63 y.o. male with medical history significant of ESRD on TTS HD currently.  Patient had PD catheter placement on 09/13/2018 at [facility].  Had HD yesterday without complications.  Has not yet started PD.  Today he presents to ED with c/o RUQ abd pain, associated with some SOB with exertion.   ED Course: In ED patient noted to have elevated troponin of 0.22, no leukocytosis, no fever, CT reveals mild bibasilar airspace disease, and a large peritoneal fluid collection, seroma vs abscess.  [facility] is unable to admit patient due to no beds available.  Patient requests that he be given a regular diet.  I tried to point out to him that not being on a renal diet could be whats making his BP elevated and I was somewhat suspicious that he may have some fluid retention with this.  He replied to me "no my blood pressure is elevated because I didn't take my meds today".   Assessment & Plan:   Principal Problem:   Peritoneal abscess [facility]) Active Problems:   Essential hypertension   Dyspnea   Chest pain   Elevated troponin   Controlled diabetes mellitus type 2 with complications (HCC)   History of MI (myocardial infarction)   ESRD (end stage renal disease) (HCC)   SOB (shortness of breath)  1 peritoneal abscess versus seroma Patient presented with right-sided abdominal pain.  CT abdomen and pelvis done concerning for abscess formation versus seroma.  Patient has been pancultured.  Patient with recent robotic incarcerated incisional hernia repair with mesh, extensive lysis of adhesions, and peritoneal dialysis catheter placement with Augmentin to pexy right upper quadrant 09/13/2018 per Dr. Raul Del. IR consulted for drain of abscesses and cultures to be sent.  Continue empiric IV Rocephin and IV vancomycin.  Aspirin and Plavix on hold.  General surgery  following and appreciate input and recommendations.  2.  Elevated troponin/chest pain Patient presented with shortness of breath on minimal exertion as well as complaints of intermittent chest pain on the left described as a dull sensation with some radiation to the left upper extremity.  EKG with T wave inversions in leads I and aVL similar to EKG from 08/19/2018.  Patient however with multiple risk factors of end-stage renal disease on hemodialysis, prior history of coronary artery disease, diabetes mellitus type 2 and as such we will consult with cardiology for further evaluation and management.  Patient with recent 2D echo done.  On 08/20/2018 with normal LV systolic function of 50 to 55%, mild LVH, mild diastolic dysfunction, calcified aortic valve with mild left ear, no wall motion abnormalities.  We will not repeat 2D echo at this time.  Continue Crestor, Imdur.  Aspirin and Plavix on hold.  3.  Diabetes mellitus type 2 Hemoglobin A1c was 6.9 on 08/20/2018.  Patient insistent on being placed on a regular diet.  Prandin.  Follow.  4.  Hyperlipidemia Continue statin.  5.  End-stage renal disease on hemodialysis Nephrology has been notified of patient's admission.  Patient for probable hemodialysis today.  Per nephrology.  6.  Shortness of breath Questionable etiology.  Could be secondary to problem #2 versus volume overload secondary to end-stage renal disease.  Bibasilar crackles noted on examination.  Nephrology consulted and patient for probable hemodialysis today.  Follow.  7.  Hypertension Continue current regimen of Imdur, Norvasc.  Patient for hemodialysis.   DVT prophylaxis: SCDs Code Status: Full Family Communication: Updated patient.  No family at bedside. Disposition Plan: To be determined.     Consultants:   IR pending  General surgery: Dr. Romana Juniper 10/01/2018  Nephrology: Dr.Schertz 10/02/2018  Cardiology pending  Procedures:   CT abdomen and pelvis  10/01/2018  Chest x-ray 10/01/2018    Antimicrobials:   IV vancomycin 10/02/2018  IV Rocephin 10/02/2018   Subjective: Patient laying in bed.  Endorses some shortness of breath.  States has been having intermittent left-sided chest pain which he describes as a dullness with radiation to his left upper extremity.  Chest pain nonreproducible.  Objective: Vitals:   10/02/18 1140 10/02/18 1145 10/02/18 1149 10/02/18 1205  BP: (!) 159/92 (!) 166/90 (!) 167/88 (!) 166/90  Pulse: 87 86 91 86  Resp: (!) 22 (!) 27 (!) 28 (!) 24  Temp:      TempSrc:      SpO2: 99% 99% 99% 99%  Weight:      Height:        Intake/Output Summary (Last 24 hours) at 10/02/2018 1447 Last data filed at 10/02/2018 1159 Gross per 24 hour  Intake 354.53 ml  Output 175 ml  Net 179.53 ml   Filed Weights   10/01/18 1910 10/02/18 0100  Weight: 103.4 kg 101.1 kg    Examination:  General exam: Appears calm and comfortable  Respiratory system: Bibasilar crackles.  No wheezing.  No rhonchi. Respiratory effort normal. Cardiovascular system: S1 & S2 heard, RRR. No JVD, murmurs, rubs, gallops or clicks. No pedal edema. Gastrointestinal system: Abdomen is nondistended, soft and tender to palpation right mid abdomen, positive bowel sounds, no rebound, no guarding.  Palpable subcutaneous mass in the region around hernia repair site right lower quadrant. Central nervous system: Alert and oriented. No focal neurological deficits. Extremities: Symmetric 5 x 5 power. Skin: No rashes, lesions or ulcers Psychiatry: Judgement and insight appear normal. Mood & affect appropriate.     Data Reviewed: I have personally reviewed following labs and imaging studies  CBC: Recent Labs  Lab 10/01/18 1927 10/02/18 0702  WBC 6.9 5.8  HGB 9.0* 7.7*  HCT 31.1* 25.4*  MCV 91.5 89.4  PLT 195 193   Basic Metabolic Panel: Recent Labs  Lab 10/01/18 1927 10/02/18 0702  NA 139 139  K 3.4* 3.8  CL 103 103  CO2 23 23  GLUCOSE 101*  87  BUN 29* 33*  CREATININE 10.09* 11.13*  CALCIUM 9.7 8.9   GFR: Estimated Creatinine Clearance: 7.5 mL/min (A) (by C-G formula based on SCr of 11.13 mg/dL (H)). Liver Function Tests: Recent Labs  Lab 10/01/18 1927  AST 20  ALT 11  ALKPHOS 70  BILITOT 0.7  PROT 7.1  ALBUMIN 3.3*   Recent Labs  Lab 10/01/18 1927  LIPASE 41   No results for input(s): AMMONIA in the last 168 hours. Coagulation Profile: No results for input(s): INR, PROTIME in the last 168 hours. Cardiac Enzymes: Recent Labs  Lab 10/02/18 0039 10/02/18 0702  TROPONINI 0.32* 0.43*   BNP (last 3 results) No results for input(s): PROBNP in the last 8760 hours. HbA1C: No results for input(s): HGBA1C in the last 72 hours. CBG: Recent Labs  Lab 10/02/18 0834  GLUCAP 77   Lipid Profile: No results for input(s): CHOL, HDL, LDLCALC, TRIG, CHOLHDL, LDLDIRECT in the last 72 hours. Thyroid Function Tests: No results for input(s): TSH, T4TOTAL, FREET4, T3FREE, THYROIDAB in the last 72 hours.  Anemia Panel: Recent Labs    10/02/18 0851  FOLATE 9.5  FERRITIN 1,319*  TIBC 148*  IRON 24*   Sepsis Labs: No results for input(s): PROCALCITON, LATICACIDVEN in the last 168 hours.  Recent Results (from the past 240 hour(s))  Culture, blood (routine x 2)     Status: None (Preliminary result)   Collection Time: 10/02/18 12:25 AM  Result Value Ref Range Status   Specimen Description BLOOD LEFT ARM  Final   Special Requests   Final    BOTTLES DRAWN AEROBIC AND ANAEROBIC Blood Culture adequate volume   Culture   Final    NO GROWTH < 12 HOURS Performed at [facility], [address] [place], [place] [address]    Report Status PENDING  Incomplete  Culture, blood (routine x 2)     Status: None (Preliminary result)   Collection Time: 10/02/18 12:40 AM  Result Value Ref Range Status   Specimen Description BLOOD LEFT HAND  Final   Special Requests   Final    BOTTLES DRAWN AEROBIC ONLY Blood Culture  results may not be optimal due to an excessive volume of blood received in culture bottles   Culture   Final    NO GROWTH < 12 HOURS Performed at [facility], [place] [address] [place], [place] [address]    Report Status PENDING  Incomplete  MRSA PCR Screening     Status: None   Collection Time: 10/02/18  7:25 AM  Result Value Ref Range Status   MRSA by PCR NEGATIVE NEGATIVE Final    Comment:        The GeneXpert MRSA Assay (FDA approved for NASAL specimens only), is one component of a comprehensive MRSA colonization surveillance program. It is not intended to diagnose MRSA infection nor to guide or monitor treatment for MRSA infections. Performed at [facility], [place] [address], [place], [place] [address]          Radiology Studies: Ct Abdomen Pelvis Wo Contrast  Result Date: 10/01/2018 CLINICAL DATA:  Shortness of breath.  Abdominal distention. EXAM: CT ABDOMEN AND PELVIS WITHOUT CONTRAST TECHNIQUE: Multidetector CT imaging of the abdomen and pelvis was performed following the standard protocol without IV contrast. COMPARISON:  None. FINDINGS: Lower chest: Heart is enlarged. Coronary artery calcifications are present. Mitral valve annular calcifications are present. Atherosclerotic changes are noted in the descending aorta. Mild bibasilar airspace disease is present. No significant pleural or pericardial effusion is present. Hepatobiliary: No focal liver abnormality is seen. No gallstones, gallbladder wall thickening, or biliary dilatation. Pancreas: Unremarkable. No pancreatic ductal dilatation or surrounding inflammatory changes. Spleen: Normal in size without focal abnormality. Adrenals/Urinary Tract: Adrenal glands are normal bilaterally. 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 Ureters are within normal limits. The urinary bladder is collapsed. Stomach/Bowel: A stomach and duodenum are within normal limits. Proximal small bowel is unremarkable. There is focal  inflammation of bowel loops of small bowel adjacent to a fluid collection along the anterior peritoneum on the right. The right anterior peritoneal fluid collection measures 12.9 x 6.5 x 14.6 cm. There is no obstruction. Terminal ileum is otherwise normal. The appendix is visualized and normal. The ascending and transverse colon is mostly collapsed. Diverticular changes are present in the descending and sigmoid colon without focal inflammation. Vascular/Lymphatic: Extensive vascular calcifications are present. There is no aneurysm. No significant adenopathy is present. Reproductive: Prostate is unremarkable. Other: Peritoneal dialysis catheter is in place. The anterior wall fluid collection  seen on the previous study is stable. This is just anterior to the new collection. Elder collection measures 8.6 x 5.5 x 7.8 cm. There is some inflammatory change the subcutaneous tissues inferiorly. Musculoskeletal: Vertebral body heights and alignment are maintained. No focal lytic or blastic lesions are present. IMPRESSION: 1. New ventral peritoneal fluid collection measures 12.9 x 6.5 x 14.6 cm. This is worrisome for a loculated fluid collection associated with dialysis or developing abscess related to bowel disease. It appears separate from the other anterior abdominal wall fluid collection. 2. The more remote anterior wall fluid collection is stable. 3. Inflammatory changes of bowel adjacent to the fluid collection, likely reflecting adhesions. There is no obstruction. This is in the region of the previous hernia. 4. Dialysis catheter. 5. Aortic Atherosclerosis (ICD10-I70.0). Extensive vascular calcifications are present without aneurysm. 6. Cystic renal disease of dialysis. 7. Cardiomegaly without failure. 8. Coronary artery disease. Electronically Signed   By: San Morelle M.D.   On: 10/01/2018 21:40   Dg Chest Port 1 View  Result Date: 10/01/2018 CLINICAL DATA:  Dyspnea for the past 2 hours. EXAM: PORTABLE  CHEST 1 VIEW COMPARISON:  08/19/2018 FINDINGS: Stable cardiomegaly with aortic atherosclerosis. Dual lumen dialysis catheter terminates in the right atrium unchanged in position. Median sternotomy sutures and post surgical clips project over the cardiac silhouette. No overt pulmonary edema. Hazy appearance at the left lung base may represent a small layering left effusion. No acute osseous abnormality. Vascular clips project over the superior mediastinum as well as a vascular stent projecting over the right upper thorax in the region of the subclavian artery. IMPRESSION: Stable cardiomegaly. Hazy opacity at the left lung base may represent a small layering left effusion, new since prior. No overt pulmonary edema. No other change of note. Electronically Signed   By: Ashley Royalty M.D.   On: 10/01/2018 20:56   Ct Image Guided Fluid Drain By Catheter  Result Date: 10/02/2018 INDICATION: Right lower quadrant superficial and deep fluid collections EXAM: CT GUIDED DRAINAGE OF RIGHT LOWER QUADRANT SUPERFICIAL AND DEEP FLUID COLLECTIONS MEDICATIONS: The patient is currently admitted to the hospital and receiving intravenous antibiotics. The antibiotics were administered within an appropriate time frame prior to the initiation of the procedure. ANESTHESIA/SEDATION: 2.0 mg IV Versed 100 mcg IV Fentanyl Moderate Sedation Time:  13 The patient was continuously monitored during the procedure by the interventional radiology nurse under my direct supervision. COMPLICATIONS: None immediate. TECHNIQUE: Informed written consent was obtained from the patient after a thorough discussion of the procedural risks, benefits and alternatives. All questions were addressed. Maximal Sterile Barrier Technique was utilized including caps, mask, sterile gowns, sterile gloves, sterile drape, hand hygiene and skin antiseptic. A timeout was performed prior to the initiation of the procedure. PROCEDURE: Previous imaging reviewed. Patient positioned  supine. Noncontrast localization CT performed. The right lower quadrant superficial and deep abdominal wall fluid collections were localized. Overlying skin marked for drainage of both collections. Under sterile conditions and local anesthesia, 18 gauge 10 cm access needle were advanced into both the superficial and deep collections. Needle positions confirmed with CT. Syringe aspiration yielded thin dark fluid from both collections suspicious for chronic hematomas. Amplatz guidewire inserted followed by tract dilatation to insert 2 10 French drains. Both drain catheter positions confirmed with CT. Catheters secured with Prolene sutures and connected to external suction bulb. Patient tolerated the procedure well. No immediate complication. Sterile dressings applied. FINDINGS: Imaging confirms needle placement into the right lower quadrant superficial and deep  fluid collections for drain placements IMPRESSION: Successful CT-guided drain placements (2) within the right lower quadrant superficial and deep abdominal wall fluid collections. Electronically Signed   By: Jerilynn Mages.  Shick M.D.   On: 10/02/2018 14:15   Ct Image Guided Fluid Drain By Catheter  Result Date: 10/02/2018 INDICATION: Right lower quadrant superficial and deep fluid collections EXAM: CT GUIDED DRAINAGE OF RIGHT LOWER QUADRANT SUPERFICIAL AND DEEP FLUID COLLECTIONS MEDICATIONS: The patient is currently admitted to the hospital and receiving intravenous antibiotics. The antibiotics were administered within an appropriate time frame prior to the initiation of the procedure. ANESTHESIA/SEDATION: 2.0 mg IV Versed 100 mcg IV Fentanyl Moderate Sedation Time:  13 The patient was continuously monitored during the procedure by the interventional radiology nurse under my direct supervision. COMPLICATIONS: None immediate. TECHNIQUE: Informed written consent was obtained from the patient after a thorough discussion of the procedural risks, benefits and alternatives.  All questions were addressed. Maximal Sterile Barrier Technique was utilized including caps, mask, sterile gowns, sterile gloves, sterile drape, hand hygiene and skin antiseptic. A timeout was performed prior to the initiation of the procedure. PROCEDURE: Previous imaging reviewed. Patient positioned supine. Noncontrast localization CT performed. The right lower quadrant superficial and deep abdominal wall fluid collections were localized. Overlying skin marked for drainage of both collections. Under sterile conditions and local anesthesia, 18 gauge 10 cm access needle were advanced into both the superficial and deep collections. Needle positions confirmed with CT. Syringe aspiration yielded thin dark fluid from both collections suspicious for chronic hematomas. Amplatz guidewire inserted followed by tract dilatation to insert 2 10 French drains. Both drain catheter positions confirmed with CT. Catheters secured with Prolene sutures and connected to external suction bulb. Patient tolerated the procedure well. No immediate complication. Sterile dressings applied. FINDINGS: Imaging confirms needle placement into the right lower quadrant superficial and deep fluid collections for drain placements IMPRESSION: Successful CT-guided drain placements (2) within the right lower quadrant superficial and deep abdominal wall fluid collections. Electronically Signed   By: Jerilynn Mages.  Shick M.D.   On: 10/02/2018 14:15        Scheduled Meds: . sodium chloride   Intravenous Once  . acetaminophen  650 mg Oral Once  . amLODipine  5 mg Oral Daily  . calcium acetate  2,001 mg Oral Q supper  . Chlorhexidine Gluconate Cloth  6 each Topical Q0600  . diphenhydrAMINE      . diphenhydrAMINE  25 mg Oral Once  . ezetimibe  10 mg Oral Daily  . fentaNYL      . isosorbide mononitrate  120 mg Oral Daily  . lidocaine      . midazolam      . multivitamin  1 tablet Oral Daily  . [START ON 10/03/2018] pneumococcal 23 valent vaccine  0.5 mL  Intramuscular Tomorrow-1000  . repaglinide  0.5 mg Oral BID AC  . rosuvastatin  10 mg Oral Daily  . sodium chloride flush  3 mL Intravenous Once  . sodium chloride flush  5 mL Intracatheter Q8H   Continuous Infusions: . cefTRIAXone (ROCEPHIN)  IV 2 g (10/02/18 0330)  . vancomycin       LOS: 0 days    Time spent: 35 minutes    Irine Seal, MD [facility]  If 7PM-7AM, please contact night-coverage [email or web address] 10/02/2018, 2:47 PM

## 2018-10-02 NOTE — Progress Notes (Addendum)
Trop 0.36 up from 0.22, leaning twords demand ischemia / CHF / fluid overload / didn't take BP meds today as being the major issue (as opposed to ACS).  Also not having chest pain.  Will go ahead and give him his imdur dose now.

## 2018-10-02 NOTE — Progress Notes (Signed)
Initial Nutrition Assessment  DOCUMENTATION CODES:   Obesity unspecified  INTERVENTION:  Provide Nepro Shake po once daily, each supplement provides 425 kcal and 19 grams protein.  Encourage adequate PO intake.   NUTRITION DIAGNOSIS:   Increased nutrient needs related to chronic illness(ESRD on HD) as evidenced by estimated needs.  GOAL:   Patient will meet greater than or equal to 90% of their needs  MONITOR:   PO intake, Supplement acceptance, Labs, Weight trends, I & O's, Skin  REASON FOR ASSESSMENT:   Malnutrition Screening Tool    ASSESSMENT:   63 y.o. year-old with hx of DM2, MI, HTN, gout, CAD, aflutter s/p ablation, and ESRD on HD presents with abdominal pains. PD cath placed in Feb 2019 and CT scan done yest showed 2 fluid collections, one intra-peritoneal and the other extra-peritoneal, suggestive of seroma vs abscess.  Pt in procedure for intra-abdominal fluid collection, aspiration, and drainage during time of visit. RD unable to obtain pt's nutrition history. Diet has been advanced. RD to order nutritional supplements to aid in caloric and protein needs.   Unable to complete Nutrition-Focused physical exam at this time.   Labs and medications reviewed.   Diet Order:   Diet Order            Diet regular Room service appropriate? Yes; Fluid consistency: Thin  Diet effective now              EDUCATION NEEDS:   Not appropriate for education at this time  Skin:  Skin Assessment: Reviewed RN Assessment  Last BM:  Unknown  Height:   Ht Readings from Last 1 Encounters:  10/02/18 5' 6.5" (1.689 m)    Weight:   Wt Readings from Last 1 Encounters:  10/02/18 102.9 kg    Ideal Body Weight:  65.9 kg  BMI:  Body mass index is 36.07 kg/m.  Estimated Nutritional Needs:   Kcal:  2050-2250  Protein:  115-130 grams  Fluid:  Per MD    Corrin Parker, MS, RD, LDN Pager # [phone] After hours/ weekend pager # [phone]

## 2018-10-02 NOTE — ED Notes (Signed)
istat trop critical results shown to MD Sheran Luz

## 2018-10-02 NOTE — Progress Notes (Signed)
Pt received 1 unit of PRBCs; tolerated transfusion well; no reaction noted; denies any s/s of a reaction.

## 2018-10-02 NOTE — Consult Note (Signed)
Chief Complaint: Patient was seen in consultation today for peritoneal and intra-abdominal abscess aspiration and possible drain placement.  Referring Physician(s): Dr. Jennette Kettle  Supervising Physician: Daryll Brod  Patient Status: [facility] - In-pt  History of Present Illness: Patrick [name] is a 63 y.o. male with a past medical history significant for arthritis, CAD, CHF, HTN, MI (2016), DM, gout and ESRD on HD via tunneled HD catheter who presented to [facility] ED on 10/01/18 with complaints of right sided abdominal pain. He underwent robotic incarcerated incisional hernia repair with mesh and peritoneal dialysis catheter placement with omentopexy on 09/13/18 by Dr. Raul Del.  CT abdomen/pelvis without contrast was performed which showed a new ventral peritoneal fluid collection measuring 12.9 x 6.5 x 14.6 cm worrisome for loculated fluid collection associated with dialysis catheter or developing abscess related to bowel disease as well as a remote anterior wall fluid collection which appeared stable from previous imaging. He was admitted for further evaluation and management. IR has been consulted for aspiration and possible drain placement of the two abdominal fluid collections.  Patient reports he's having some pain on his right side still but this has improved since admission. He does not feel tender at the site of his PD catheter. He denies any other complaints except that he is hungry and thirsty. He is wondering when he will be able to use his PD catheter and if his PD catheter education appointment needs to be rescheduled. He states understanding of requested procedure and wishes to proceed.   Past Medical History:  Diagnosis Date  . Arthritis    "back" (09/24/2016)  . CHF (congestive heart failure) ([place])   . Coronary artery disease   . Dysrhythmia    aflutter s/p ablation 2018  . ESRD (end stage renal disease) on dialysis ([place])    Pt on HD 2003 >> transplanted in 2009, back on HD in  2016. [name]TS.   . Gout   . Hernia of abdominal wall   . History of blood transfusion 2009- 2016   "I've had several; low HgB"  . History of kidney stones    treated with nephrectomy  . Hypertension   . Impotence of organic origin   . Migraine    "stopped in my 30's; they were related to high BP" (09/24/2016)  . Myocardial infarction [facility]) '96  . Pneumonia 01/2015   " a touch & I was in the hosp."  . Secondary hyperparathyroidism ([place])   . Type 2 diabetes mellitus (H[facility])    no longer on medication since going on dialysis  . Wears glasses     Past Surgical History:  Procedure Laterality Date  . A-FLUTTER ABLATION N/A 09/24/2016   Procedure: A-Flutter Ablation;  Surgeon: Will Meredith Leeds, MD;  Location: [place] CV [facility];  Service: Cardiovascular;  Laterality: N/A;  . ABDOMINAL AORTOGRAM W/LOWER EXTREMITY N/A 04/13/2017   Procedure: ABDOMINAL AORTOGRAM W/LOWER EXTREMITY;  Surgeon: Conrad Gifford, MD;  Location: [place] CV LAB;  Service: Cardiovascular;  Laterality: N/A;  Bilater lower extermity  . APPENDECTOMY    . AV FISTULA PLACEMENT Right 09/18/2014   Procedure: INSERTION OF ARTERIOVENOUS (AV) GORE-TEX GRAFT ARM USING 4-7MM  X 45CM STRETCH GORE-TEX VASCULAR GRAFT;  Surgeon: Rosetta Posner, MD;  Location: [place];  Service: Vascular;  Laterality: Right;  . AV FISTULA PLACEMENT Left 07/07/2016   Procedure: INSERTION OF LEFT BRACHIAL TO AXILLARY ARTERIOVENOUS (AV) GORE-TEX ARM GRAFT;  Surgeon: Conrad Ratamosa, MD;  Location: [facility]  OR;  Service: Vascular;  Laterality: Left;  . CARDIAC CATHETERIZATION  ~ 2016  . COLONOSCOPY    . 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 EXCHANGE OF A DIALYSIS CATHETER Left 01/11/2018   Procedure: EXCHANGE OF TUNNELED DIALYSIS CATHETER;  Surgeon: Rosetta Posner, MD;  Location: [place];  Service: Vascular;  Laterality: Left;  . HERNIA REPAIR  2017   with nephrectomy  . INSERTION OF  DIALYSIS CATHETER N/A 10/08/2017   Procedure: INSERTION OF TUNNELED DIALYSIS CATHETER;  Surgeon: Conrad San German, MD;  Location: [place];  Service: Vascular;  Laterality: N/A;  . IR FLUORO GUIDE CV LINE LEFT  03/12/2018  . IR GENERIC HISTORICAL  05/11/2016   IR FLUORO GUIDE CV LINE LEFT 05/11/2016 Corrie Mckusick, DO [facility]-INTERV RAD  . IR GENERIC HISTORICAL  05/11/2016   IR US GUIDE VASC ACCESS LEFT 05/11/2016 Corrie Mckusick, DO [facility]  . IR GENERIC HISTORICAL  05/11/2016   IR US GUIDE VASC ACCESS RIGHT 05/11/2016 Corrie Mckusick, DO [facility]  . IR GENERIC HISTORICAL  05/11/2016   IR RADIOLOGY PERIPHERAL GUIDED IV START 05/11/2016 Corrie Mckusick, DO [facility]-INTER[facility]  . KIDNEY TRANSPLANT  2009  . LEFT HEART CATH AND CORS/GRAFTS ANGIOGRAPHY N/A 12/04/2017   Procedure: LEFT HEART CATH AND CORS/GRAFTS ANGIOGRAPHY;  Surgeon: Belva Crome, MD;  Location: [place] CV [facility];  Service: Cardiovascular;  Laterality: N/A;  . NEPHRECTOMY  2017   transplant rejected   . PERIPHERAL VASCULAR CATHETERIZATION N/A 06/04/2016   Procedure: Upper Extremity Venography;  Surgeon: Waynetta Sandy, MD;  Location: [place] [facility];  Service: Cardiovascular;  Laterality: N/A;  . PERIPHERAL VASCULAR INTERVENTION  04/13/2017   Procedure: PERIPHERAL VASCULAR INTERVENTION;  Surgeon: Conrad West Burke, MD;  Location: [place] [facility];  Service: Cardiovascular;;  Lt. Common/Exernal  Iliac  . THROMBECTOMY AND REVISION OF ARTERIOVENTOUS (AV) GORETEX  GRAFT Left 10/08/2017   Procedure: THROMBECTOMY of ARTERIOVENTOUS (AV) GORETEX  GRAFT LEFT UPPER ARM;  Surgeon: Conrad Bunn, MD;  Location: [place];  Service: Vascular;  Laterality: Left;  . THROMBECTOMY W/ EMBOLECTOMY Left 09/14/2017   Procedure: THROMBECTOMY ARTERIOVENOUS GORE-TEX GRAFT LEFT UPPER ARM;  Surgeon: Rosetta Posner, MD;  Location: [place];  Service: Vascular;  Laterality: Left;  . UPPER EXTREMITY VENOGRAPHY N/A 11/16/2017   Procedure: UPPER EXTREMITY VENOGRAPHY - Right  Arm;  Surgeon: Conrad West [place], MD;  Location: [place] [facility];  Service: Cardiovascular;  Laterality: N/A;    Allergies: Iodinated diagnostic agents; Lipitor [atorvastatin]; Metoprolol; and Baclofen  Medications: Prior to Admission medications   Medication Sig Start Date End Date Taking? Authorizing Provider  acetaminophen (TYLENOL) 500 MG tablet Take 1,000 mg by mouth every 6 (six) hours as needed for moderate pain or headache.   Yes [provider]  albuterol (PROVENTIL HFA;VENTOLIN HFA) 108 (90 Base) MCG/ACT inhaler Inhale 1-2 puffs into the lungs every 6 (six) hours as needed for wheezing or shortness of breath. 12/18/17  Yes Weaver, Scott T, PA-C  amLODipine (NORVASC) 5 MG tablet Take 1 tablet (5 mg total) by mouth daily. 11/27/17 08/19/26 Yes Richardson Dopp T, PA-C  aspirin EC 81 MG tablet Take 81 mg by mouth daily.   Yes [provider]  calcium acetate (PHOSLO) 667 MG capsule Take 2,001 mg by mouth daily with supper.    Yes [provider]  clopidogrel (PLAVIX) 75 MG tablet Take 1 tablet (75  mg total) by mouth daily. 12/18/17  Yes Weaver, Scott T, PA-C  ezetimibe (ZETIA) 10 MG tablet Take 1 tablet (10 mg total) by mouth daily. 04/02/18 08/19/26 Yes Weaver, Scott T, PA-C  gentamicin cream (GARAMYCIN) 0.1 % Apply 1 application topically daily as needed (prior to accessing PD catheter).  09/28/18  Yes [provider]  HYDROcodone-acetaminophen (NORCO/VICODIN) 5-325 MG tablet Take 1 tablet by mouth every 6 (six) hours as needed for moderate pain.   Yes [provider]  isosorbide mononitrate (IMDUR) 60 MG 24 hr tablet Take 2 tablets (120 mg total) by mouth daily. 08/21/18  Yes Hongalgi, Lenis Dickinson, MD  multivitamin (RENA-VIT) TABS tablet Take 1 tablet by mouth daily.   Yes [provider]  repaglinide (PRANDIN) 0.5 MG tablet Take 1 tablet (0.5 mg total) by mouth 2 (two) times daily before a meal. 04/05/18  Yes Renato Shin, MD  rosuvastatin (CRESTOR)  10 MG tablet Take 1 tablet (10 mg total) by mouth daily at 6 PM. Patient taking differently: Take 10 mg by mouth daily.  08/21/18  Yes Hongalgi, Lenis Dickinson, MD     Family History  Problem Relation Age of Onset  . Hyperlipidemia Mother   . Hypertension Mother   . HIV Sister     Social History   Socioeconomic History  . Marital status: Married    Spouse name: SHEILA  . Number of children: 5  . Years of education: 62  . Highest education level: 12th grade  Occupational History  . Occupation: DISABLED  Social Needs  . Financial resource strain: Not on file  . Food insecurity:    Worry: Never true    Inability: Never true  . Transportation needs:    Medical: No    Non-medical: No  Tobacco Use  . Smoking status: Former Smoker    Types: Cigarettes    Last attempt to quit: 07/04/2014    Years since quitting: 4.2  . Smokeless tobacco: Never Used  . Tobacco comment: "smoked ~ 1 pack/month when I did smoke; never a steady smoker"  Substance and Sexual Activity  . Alcohol use: Yes    Alcohol/week: 0.0 standard drinks    Comment: rare  "2 drinks, 1-2 times/year"  . Drug use: No  . Sexual activity: Not Currently    Birth control/protection: None  Lifestyle  . Physical activity:    Days per week: 0 days    Minutes per session: 0 min  . Stress: Not at all  Relationships  . Social connections:    Talks on phone: More than three times a week    Gets together: More than three times a week    Attends religious service: Never    Active member of club or organization: No    Attends meetings of clubs or organizations: Never    Relationship status: Married  Other Topics Concern  . Not on file  Social History Narrative  . Not on file     Review of Systems: A 12 point ROS discussed and pertinent positives are indicated in the HPI above.  All other systems are negative.  Review of Systems  Constitutional: Negative for appetite change, chills and fever.  Respiratory: Positive for  shortness of breath (with walking). Negative for cough.   Cardiovascular: Negative for chest pain.  Gastrointestinal: Positive for abdominal pain (Right side - upper and lower quadrants). Negative for diarrhea, nausea and vomiting.  Musculoskeletal: Negative for back pain.  Neurological: Negative for dizziness and syncope.  Vital Signs: BP (!) 142/91 (BP Location: Left Arm)   Pulse 96   Temp 97.9 F (36.6 C) (Oral)   Resp 17   Ht 5' 6.5" (1.689 m)   Wt 222 lb 14.2 oz (101.1 kg)   SpO2 92%   BMI 35.44 kg/m   Physical Exam Vitals signs and nursing note reviewed.  Constitutional:      General: He is not in acute distress.    Appearance: He is obese.  HENT:     Head: Normocephalic.  Cardiovascular:     Rate and Rhythm: Normal rate and regular rhythm.  Pulmonary:     Effort: Pulmonary effort is normal.     Breath sounds: Normal breath sounds.  Abdominal:     Tenderness: There is abdominal tenderness (RUQ mostly, some RLQ as well).  Skin:    General: Skin is warm and dry.  Neurological:     Mental Status: He is alert and oriented to person, place, and time.  Psychiatric:        Mood and Affect: Mood normal.        Behavior: Behavior normal.        Thought Content: Thought content normal.        Judgment: Judgment normal.      MD Evaluation Airway: WNL Heart: WNL Abdomen: WNL Chest/ Lungs: WNL ASA  Classification: 3 Mallampati/Airway Score: One   Imaging: Ct Abdomen Pelvis Wo Contrast  Result Date: 10/01/2018 CLINICAL DATA:  Shortness of breath.  Abdominal distention. EXAM: CT ABDOMEN AND PELVIS WITHOUT CONTRAST TECHNIQUE: Multidetector CT imaging of the abdomen and pelvis was performed following the standard protocol without IV contrast. COMPARISON:  None. FINDINGS: Lower chest: Heart is enlarged. Coronary artery calcifications are present. Mitral valve annular calcifications are present. Atherosclerotic changes are noted in the descending aorta. Mild bibasilar  airspace disease is present. No significant pleural or pericardial effusion is present. Hepatobiliary: No focal liver abnormality is seen. No gallstones, gallbladder wall thickening, or biliary dilatation. Pancreas: Unremarkable. No pancreatic ductal dilatation or surrounding inflammatory changes. Spleen: Normal in size without focal abnormality. Adrenals/Urinary Tract: Adrenal glands are normal bilaterally. 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 Ureters are within normal limits. The urinary bladder is collapsed. Stomach/Bowel: A stomach and duodenum are within normal limits. Proximal small bowel is unremarkable. There is focal inflammation of bowel loops of small bowel adjacent to a fluid collection along the anterior peritoneum on the right. The right anterior peritoneal fluid collection measures 12.9 x 6.5 x 14.6 cm. There is no obstruction. Terminal ileum is otherwise normal. The appendix is visualized and normal. The ascending and transverse colon is mostly collapsed. Diverticular changes are present in the descending and sigmoid colon without focal inflammation. Vascular/Lymphatic: Extensive vascular calcifications are present. There is no aneurysm. No significant adenopathy is present. Reproductive: Prostate is unremarkable. Other: Peritoneal dialysis catheter is in place. The anterior wall fluid collection seen on the previous study is stable. This is just anterior to the new collection. Elder collection measures 8.6 x 5.5 x 7.8 cm. There is some inflammatory change the subcutaneous tissues inferiorly. Musculoskeletal: Vertebral body heights and alignment are maintained. No focal lytic or blastic lesions are present. IMPRESSION: 1. New ventral peritoneal fluid collection measures 12.9 x 6.5 x 14.6 cm. This is worrisome for a loculated fluid collection associated with dialysis or developing abscess related to bowel disease. It appears separate from the other anterior abdominal wall fluid collection. 2.  The more remote anterior wall fluid collection  is stable. 3. Inflammatory changes of bowel adjacent to the fluid collection, likely reflecting adhesions. There is no obstruction. This is in the region of the previous hernia. 4. Dialysis catheter. 5. Aortic Atherosclerosis (ICD10-I70.0). Extensive vascular calcifications are present without aneurysm. 6. Cystic renal disease of dialysis. 7. Cardiomegaly without failure. 8. Coronary artery disease. Electronically Signed   By: San Morelle M.D.   On: 10/01/2018 21:40   Dg Chest Port 1 View  Result Date: 10/01/2018 CLINICAL DATA:  Dyspnea for the past 2 hours. EXAM: PORTABLE CHEST 1 VIEW COMPARISON:  08/19/2018 FINDINGS: Stable cardiomegaly with aortic atherosclerosis. Dual lumen dialysis catheter terminates in the right atrium unchanged in position. Median sternotomy sutures and post surgical clips project over the cardiac silhouette. No overt pulmonary edema. Hazy appearance at the left lung base may represent a small layering left effusion. No acute osseous abnormality. Vascular clips project over the superior mediastinum as well as a vascular stent projecting over the right upper thorax in the region of the subclavian artery. IMPRESSION: Stable cardiomegaly. Hazy opacity at the left lung base may represent a small layering left effusion, new since prior. No overt pulmonary edema. No other change of note. Electronically Signed   By: Ashley Royalty M.D.   On: 10/01/2018 20:56    Labs:  CBC: Recent Labs    08/19/18 1716 08/21/18 0738 10/01/18 1927 10/02/18 0702  WBC 6.6 6.3 6.9 5.8  HGB 12.5* 11.0* 9.0* 7.7*  HCT 41.8 37.2* 31.1* 25.4*  PLT 176 156 195 175    COAGS: Recent Labs    08/19/18 1716  INR 1.06  APTT 34    BMP: Recent Labs    08/19/18 1716 08/21/18 0739 10/01/18 1927 10/02/18 0702  NA 137 135 139 139  K 4.9 5.2* 3.4* 3.8  CL 99 94* 103 103  CO2 22 24 23 23   GLUCOSE 148* 118* 101* 87  BUN 24* 47* 29* 33*  CALCIUM  10.5* 9.3 9.7 8.9  CREATININE 10.12* 14.80* 10.09* 11.13*  GFRNONAA 5* 3* 5* 4*  GFRAA 6* 4* 6* 5*    LIVER FUNCTION TESTS: Recent Labs    05/11/18 1735  07/02/18 0735 08/19/18 1716 08/21/18 0739 10/01/18 1927  BILITOT 1.0  --  0.3 0.5  --  0.7  AST 34  --  14 19  --  20  ALT 12  --  8 10  --  11  ALKPHOS 53  --  173* 63  --  70  PROT 7.3  --  7.3 8.6*  --  7.1  ALBUMIN 3.4*   < > 4.5 4.1 3.5 3.3*   < > = values in this interval not displayed.    TUMOR MARKERS: No results for input(s): AFPTM, CEA, CA199, CHROMGRNA in the last 8760 hours.  Assessment and Plan:  63 y/o M with history of ESRD on HD via tunneled IJ currently who recently underwent robotic incarcerated incisional hernia repair with mesh and peritoneal dialysis catheter placement with omentopexy on 09/13/18 by Dr. Raul Del. He presented to the ED with right sided abdominal pain - imaging showed new peritoneal fluid collection as well as intra-abdominal fluid collection. Request made to IR for aspiration and drainage of these two collections - patient reviewed by Dr. Annamaria Boots who agrees to procedure today in CT.  Patient has been NPO since midnight, he does not take blood thinning medications. Afebrile, WBC 5.8, hgb 7.7, plt 175, creatinine 10.09.   Risks and benefits discussed with the patient including  bleeding, infection, damage to adjacent structures, bowel perforation/fistula connection, and sepsis.  All of the patient's questions were answered, patient is agreeable to proceed.  Consent signed and in chart.   Thank you for this interesting consult.  I greatly enjoyed meeting TOBE KERVIN and look forward to participating in their care.  A copy of this report was sent to the requesting provider on this date.  Electronically Signed: Joaquim Nam, PA-C 10/02/2018, 9:36 AM   I spent a total of 40 Minutes in face to face in clinical consultation, greater than 50% of which was counseling/coordinating care for  abdominal drain placement x2.

## 2018-10-02 NOTE — Consult Note (Signed)
Renal Service Consult Note [facility]  Patrick [name] 10/02/2018 [name] Requesting Physician:  Dr Grandville Silos  Reason for Consult:  ESRD pt w/ abd pain HPI: The patient is a 63 y.o. year-old with hx of DM2, MI, HTN, gout, CAD, aflutter s/p ablation, and ESRD on HD presented to ED w/ R sided abd pain. Pt had new PD cath placed in Feb 2019 and CT scan done yest showed 2 fluid collections, one intra-peritoneal and the other extra-peritoneal, suggestive of seroma vs abscess.  Pt was admitted and started on IV abx w/ Rocephin and IV vanc.  Abd pain is sig better this am, was all over the R side and today is more localized and less severe.  Asked to see for ESRD.   Pt started on HD in 2002, had renal Tx from 2009 > 2015, went back on HD in 2016.  PD cath placed 09/13/18 w/ plans to transition to PD.  Pt has severe PAD.  Had RUE AVG placed in 2016 which failed then LUE AVG in 2017 which failed in 2019. [place]um dependent since March 2019.     ROS  denies CP  no joint pain   no HA  no blurry vision  no rash  no diarrhea  no nausea/ vomiting   Past Medical History  Past Medical History:  Diagnosis Date  . Arthritis    "back" (09/24/2016)  . CHF (congestive heart failure) ([place])   . Coronary artery disease   . Dysrhythmia    aflutter s/p ablation 2018  . ESRD (end stage renal disease) on dialysis ([place])    Pt on HD 2003 >> transplanted in 2009, back on HD in 2016. [name] TTS.   . Gout   . Hernia of abdominal wall   . History of blood transfusion 2009- 2016   "I've had several; low HgB"  . History of kidney stones    treated with nephrectomy  . Hypertension   . Impotence of organic origin   . Migraine    "stopped in my 30's; they were related to high BP" (09/24/2016)  . Myocardial infarction [facility]) '96  . Pneumonia 01/2015   " a touch & I was in the hosp."  . Secondary hyperparathyroidism ([place])   . Type 2 diabetes mellitus (HCC)    no longer on medication since  going on dialysis  . Wears glasses    Past Surgical History  Past Surgical History:  Procedure Laterality Date  . A-FLUTTER ABLATION N/A 09/24/2016   Procedure: A-Flutter Ablation;  Surgeon: Will Meredith Leeds, MD;  Location: [place] [facility];  Service: Cardiovascular;  Laterality: N/A;  . ABDOMINAL AORTOGRAM W/LOWER EXTREMITY N/A 04/13/2017   Procedure: ABDOMINAL AORTOGRAM W/LOWER EXTREMITY;  Surgeon: Conrad Shell, MD;  Location: [place] [facility];  Service: Cardiovascular;  Laterality: N/A;  Bilater lower extermity  . APPENDECTOMY    . AV FISTULA PLACEMENT Right 09/18/2014   Procedure: INSERTION OF ARTERIOVENOUS (AV) GORE-TEX GRAFT ARM USING 4-7MM  X 45CM STRETCH GORE-TEX VASCULAR GRAFT;  Surgeon: Rosetta Posner, MD;  Location: [place];  Service: Vascular;  Laterality: Right;  . AV FISTULA PLACEMENT Left 07/07/2016   Procedure: INSERTION OF LEFT BRACHIAL TO AXILLARY ARTERIOVENOUS (AV) GORE-TEX ARM GRAFT;  Surgeon: Conrad Stratford, MD;  Location: [place];  Service: Vascular;  Laterality: Left;  . CARDIAC CATHETERIZATION  ~ 2016  . COLONOSCOPY    . 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 EXCHANGE OF A DIALYSIS CATHETER Left 01/11/2018   Procedure: EXCHANGE OF TUNNELED DIALYSIS CATHETER;  Surgeon: Rosetta Posner, MD;  Location: [place];  Service: Vascular;  Laterality: Left;  . HERNIA REPAIR  2017   with nephrectomy  . INSERTION OF DIALYSIS CATHETER N/A 10/08/2017   Procedure: INSERTION OF TUNNELED DIALYSIS CATHETER;  Surgeon: Conrad [place]eb[place], MD;  Location: [place];  Service: Vascular;  Laterality: N/A;  . IR FLUORO GUIDE CV LINE LEFT  03/12/2018  . IR GENERIC HISTORICAL  05/11/2016   IR FLUORO GUIDE CV LINE LEFT 05/11/2016 Corrie Mckusick, DO [facility]-INTER[facility]  . IR GENERIC HISTORICAL  05/11/2016   IR US GUIDE VASC ACCESS LEFT 05/11/2016 Corrie Mckusick, DO [facility]  . IR GENERIC HISTORICAL  05/11/2016   IR US GUIDE VASC ACCESS  RIGHT 05/11/2016 Corrie Mckusick, DO [facility]  . IR GENERIC HISTORICAL  05/11/2016   IR RADIOLOGY PERIPHERAL GUIDED IV START 05/11/2016 Corrie Mckusick, DO [facility]INTER[facility]  . KIDNEY TRANSPLANT  2009  . LEFT HEART CATH AND CORS/GRAFTS ANGIOGRAPHY N/A 12/04/2017   Procedure: LEFT HEART CATH AND CORS/GRAFTS ANGIOGRAPHY;  Surgeon: Belva Crome, MD;  Location: [place] [facility];  Service: Cardiovascular;  Laterality: N/A;  . NEPHRECTOMY  2017   transplant rejected   . PERIPHERAL VASCULAR CATHETERIZATION N/A 06/04/2016   Procedure: Upper Extremity Venography;  Surgeon: Waynetta Sandy, MD;  Location: [place] [facility];  Service: Cardiovascular;  Laterality: N/A;  . PERIPHERAL VASCULAR INTERVENTION  04/13/2017   Procedure: PERIPHERAL VASCULAR INTERVENTION;  Surgeon: Conrad [place], MD;  Location: [place] [facility];  Service: Cardiovascular;;  Lt. Common/Exernal  Iliac  . THROMBECTOMY AND REVISION OF ARTERIOVENTOUS (AV) GORETEX  GRAFT Left 10/08/2017   Procedure: THROMBECTOMY of ARTERIOVENTOUS (AV) GORETEX  GRAFT LEFT UPPER ARM;  Surgeon: Conrad [place], MD;  Location: [place];  Service: Vascular;  Laterality: Left;  . THROMBECTOMY W/ EMBOLECTOMY Left 09/14/2017   Procedure: THROMBECTOMY ARTERIOVENOUS GORE-TEX GRAFT LEFT UPPER ARM;  Surgeon: Rosetta Posner, MD;  Location: [place];  Service: Vascular;  Laterality: Left;  . UPPER EXTREMITY VENOGRAPHY N/A 11/16/2017   Procedure: UPPER EXTREMITY VENOGRAPHY - Right Arm;  Surgeon: Conrad Randsburg, MD;  Location: [place] [facility];  Service: Cardiovascular;  Laterality: N/A;   Family History  Family History  Problem Relation Age of Onset  . Hyperlipidemia Mother   . Hypertension Mother   . HIV Sister    Social History  reports that he quit smoking about 4 years ago. His smoking use included cigarettes. He has never used smokeless tobacco. He reports current alcohol use. He reports that he does not use drugs. Allergies  Allergies  Allergen Reactions  .  Iodinated Diagnostic Agents Swelling, Rash and Other (See Comments)    Other Reaction: burning to mouth, swelling of lips  . Lipitor [Atorvastatin] Other (See Comments)    Leg pain  . Metoprolol Other (See Comments)    Headaches   . Baclofen Other (See Comments)    Possibly stroke like symptoms   Home medications Prior to Admission medications   Medication Sig Start Date End Date Taking? Authorizing Provider  acetaminophen (TYLENOL) 500 MG tablet Take 1,000 mg by mouth every 6 (six) hours as needed for moderate pain or headache.   Yes [provider]  albuterol (PROVENTIL HFA;VENTOLIN HFA) 108 (90 Base) MCG/ACT inhaler Inhale 1-2 puffs into the lungs every 6 (  six) hours as needed for wheezing or shortness of breath. 12/18/17  Yes Weaver, Scott T, PA-C  amLODipine (NORVASC) 5 MG tablet Take 1 tablet (5 mg total) by mouth daily. 11/27/17 08/19/26 Yes Richardson Dopp T, PA-C  aspirin EC 81 MG tablet Take 81 mg by mouth daily.   Yes [provider]  calcium acetate (PHOSLO) 667 MG capsule Take 2,001 mg by mouth daily with supper.    Yes [provider]  clopidogrel (PLAVIX) 75 MG tablet Take 1 tablet (75 mg total) by mouth daily. 12/18/17  Yes Weaver, Scott T, PA-C  ezetimibe (ZETIA) 10 MG tablet Take 1 tablet (10 mg total) by mouth daily. 04/02/18 08/19/26 Yes Weaver, Scott T, PA-C  gentamicin cream (GARAMYCIN) 0.1 % Apply 1 application topically daily as needed (prior to accessing PD catheter).  09/28/18  Yes [provider]  HYDROcodone-acetaminophen (NORCO/VICODIN) 5-325 MG tablet Take 1 tablet by mouth every 6 (six) hours as needed for moderate pain.   Yes [provider]  isosorbide mononitrate (IMDUR) 60 MG 24 hr tablet Take 2 tablets (120 mg total) by mouth daily. 08/21/18  Yes Hongalgi, Lenis Dickinson, MD  multivitamin (RENA-VIT) TABS tablet Take 1 tablet by mouth daily.   Yes [provider]  repaglinide (PRANDIN) 0.5 MG tablet Take 1 tablet (0.5 mg  total) by mouth 2 (two) times daily before a meal. 04/05/18  Yes Renato Shin, MD  rosuvastatin (CRESTOR) 10 MG tablet Take 1 tablet (10 mg total) by mouth daily at 6 PM. Patient taking differently: Take 10 mg by mouth daily.  08/21/18  Yes Modena Jansky, MD   Liver Function Tests Recent Labs  Lab 10/01/18 1927  AST 20  ALT 11  ALKPHOS 70  BILITOT 0.7  PROT 7.1  ALBUMIN 3.3*   Recent Labs  Lab 10/01/18 1927  LIPASE 41   CBC Recent Labs  Lab 10/01/18 1927 10/02/18 0702  WBC 6.9 5.8  HGB 9.0* 7.7*  HCT 31.1* 25.4*  MCV 91.5 89.4  PLT 195 789   Basic Metabolic Panel Recent Labs  Lab 10/01/18 1927 10/02/18 0702  NA 139 139  K 3.4* 3.8  CL 103 103  CO2 23 23  GLUCOSE 101* 87  BUN 29* 33*  CREATININE 10.09* 11.13*  CALCIUM 9.7 8.9   Iron/TIBC/Ferritin/ %Sat    Component Value Date/Time   IRON 24 (L) 10/02/2018 0851   TIBC 148 (L) 10/02/2018 0851   FERRITIN 1,319 (H) 10/02/2018 0851   IRONPCTSAT 16 (L) 10/02/2018 0851    Vitals:   10/02/18 0000 10/02/18 0015 10/02/18 0100 10/02/18 0808  BP:  (!) 167/92 (!) 166/90 (!) 142/91  Pulse: 93 90 89 96  Resp: (!) 29 (!) 24  17  Temp:   98.1 F (36.7 C) 97.9 F (36.6 C)  TempSrc:   Oral Oral  SpO2: 97% 95% 96% 92%  Weight:   101.1 kg   Height:   5' 6.5" (1.689 m)    Exam Gen alert, no distress, ^wob when speaking, nasal O2 No rash, cyanosis or gangrene Sclera anicteric, throat clear +JVD to angle of jaw Chest occ scattered basilar crackles, no wheezes RRR no MRG Abd soft ntnd no mass or ascites +bs, obese, tender fullness RLQ / R mid abdomen; PD cath lower abd w/ dressing GU normal male MS no joint effusions or deformity Ext no LE edema, no wounds or ulcers Neuro is alert, Ox 3 , nf L IJ [facility]     Home  meds:  - amlodipine 5  - aspirin 81/ clopidogrel 75 qd/ ezetimibe 10 / isosorbide mono 120 qd/ rosuvastatin 10 qd  - repaglinide 0.5 bid  - prn hydrocodone-aceta qid/ calc acetate 3 ac/ prn albut  nebs     [place] [place]  4h  2K/3.5 bath  400/800  101.5kg   TDC [facility]   Hep 3000    Assessment: 1. Abd pain/ fluid collections by CT in region of recently placed PD cath -  ?infection vs seroma. For IR aspiration today. On empiric IV abx and abd pain better today. Gen surg is following.  2. ESRD - on HD TTS. SP recent PD cath 09/13/18 Dr Raul Del along w/ hernia repair w/ mesh.  3. Hx failed renal Tx  4. HTN on norvasc 5. SOB - looks wet on CXR, losing body wt will challenge edw today  6. CAD hx MI 7. DM2 8. Anemia ckd - Hb down, get OP records    Plan: 1. HD today, max UF as tolerated, get vol down      [facility] 10/02/2018, 10:54 AM

## 2018-10-02 NOTE — Consult Note (Addendum)
Cardiology Consultation:   Patient ID: Patrick [name]; [id number]; 12-20-1955   Admit date: 10/01/2018 Date of Consult: 10/02/2018  Primary Care Provider: Nolene Ebbs, MD Primary Cardiologist: Dr. Dorris Carnes   Patient Profile:   Patrick [name] is a 63 y.o. 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 Grandville Silos.  History of Present Illness:   Patrick [name] is currently admitted to the hospital reporting right-sided abdominal pain.  He underwent peritoneal dialysis cath dialysis catheter placement in mid February although has not yet started peritoneal dialysis.  He also mentions having recurring episodes of left-sided chest pain, not new.  He has been treated for both cardiac and noncardiac thoracic pain.  His last cardiac catheterization in May 2019 showed an atretic LIMA to LAD with occluded LAD associated with left to left and left to right collaterals and it was felt that medical therapy was the best option at that time.  On my evaluation patient was in hemodialysis unit, tolerating treatment.  No active chest pain reported.  Initial troponin I level 0.32 and 0.43.  I personally reviewed his ECG from March 6 which showed sinus tachycardia with rightward axis and inferior Q waves, nonspecific ST changes.  Most recent echocardiogram in January of this year showed mild LVH with LVEF 50 to 55%, no regional wall motion abnormalities, grade 1 diastolic dysfunction, mild aortic stenosis.  Past Medical History:  Diagnosis Date  . Arthritis   . Atrial flutter ([place])    Ablation 2018  . 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 Erectile dysfunction   . ESRD (end stage renal disease) on dialysis ([place])    Pt on HD 2003 >> transplanted in 2009, back on HD in 2016. [name].   . Essential hypertension   . Gout   . Hernia of abdominal wall   . History of blood transfusion   . History of cardiomyopathy   . History of kidney stones   . History of pneumonia   . Migraine   . Myocardial infarction ([place])    1996  . Secondary hyperparathyroidism ([place])   . Type 2 diabetes mellitus ([place])   . Wears glasses     Past Surgical History:  Procedure Laterality Date  . A-FLUTTER ABLATION N/A 09/24/2016   Procedure: A-Flutter Ablation;  Surgeon: Will Meredith Leeds, MD;  Location: [place] [facility];  Service: Cardiovascular;  Laterality: N/A;  . ABDOMINAL AORTOGRAM W/LOWER EXTREMITY N/A 04/13/2017   Procedure: ABDOMINAL AORTOGRAM W/LOWER EXTREMITY;  Surgeon: Conrad Combine, MD;  Location: [facility];  Service: Cardiovascular;  Laterality: N/A;  Bilater lower extermity  . APPENDECTOMY    . AV FISTULA PLACEMENT Right 09/18/2014   Procedure: INSERTION OF ARTERIOVENOUS (AV) GORE-TEX GRAFT ARM USING 4-7MM  X 45CM STRETCH GORE-TEX VASCULAR GRAFT;  Surgeon: Rosetta Posner, MD;  Location: [facility];  Service: Vascular;  Laterality: Right;  . AV FISTULA PLACEMENT Left 07/07/2016   Procedure: INSERTION OF LEFT BRACHIAL TO AXILLARY ARTERIOVENOUS (AV) GORE-TEX ARM GRAFT;  Surgeon: Conrad Cudjoe Key, MD;  Location: [facility]  OR;  Service: Vascular;  Laterality: Left;  . CARDIAC CATHETERIZATION  ~ 2016  . COLONOSCOPY    . 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 EXCHANGE OF A DIALYSIS CATHETER Left 01/11/2018   Procedure: EXCHANGE OF TUNNELED DIALYSIS CATHETER;  Surgeon: Rosetta Posner, MD;  Location: [facility];  Service: Vascular;  Laterality: Left;  . HERNIA REPAIR  2017   with nephrectomy  . INSERTION OF DIALYSIS CATHETER  N/A 10/08/2017   Procedure: INSERTION OF TUNNELED DIALYSIS CATHETER;  Surgeon: Conrad El Nido, MD;  Location: [facility];  Service: Vascular;  Laterality: N/A;  . IR FLUORO GUIDE CV LINE LEFT  03/12/2018  . IR GENERIC HISTORICAL  05/11/2016   IR FLUORO GUIDE CV LINE LEFT 05/11/2016 Corrie Mckusick, DO [facility]  . IR GENERIC HISTORICAL  05/11/2016   IR US GUIDE VASC ACCESS LEFT 05/11/2016 Corrie Mckusick, DO [facility]  . IR GENERIC HISTORICAL  05/11/2016   IR US GUIDE VASC ACCESS RIGHT 05/11/2016 Corrie Mckusick, DO [facility]  . IR GENERIC HISTORICAL  05/11/2016   IR RADIOLOGY PERIPHERAL GUIDED IV START 05/11/2016 Corrie Mckusick, DO [facility]  . KIDNEY TRANSPLANT  2009  . LEFT HEART CATH AND CORS/GRAFTS ANGIOGRAPHY N/A 12/04/2017   Procedure: LEFT HEART CATH AND CORS/GRAFTS ANGIOGRAPHY;  Surgeon: Belva Crome, MD;  Location: [facility];  Service: Cardiovascular;  Laterality: N/A;  . NEPHRECTOMY  2017   transplant rejected   . PERIPHERAL VASCULAR CATHETERIZATION N/A 06/04/2016   Procedure: Upper Extremity Venography;  Surgeon: Waynetta Sandy, MD;  Location: [facility];  Service: Cardiovascular;  Laterality: N/A;  . PERIPHERAL VASCULAR INTERVENTION  04/13/2017   Procedure: PERIPHERAL VASCULAR INTERVENTION;  Surgeon: Conrad Creston, MD;  Location: [facility];  Service: Cardiovascular;;  Lt. Common/Exernal  Iliac  . THROMBECTOMY AND REVISION OF ARTERIOVENTOUS (AV) GORETEX  GRAFT Left 10/08/2017   Procedure: THROMBECTOMY of ARTERIOVENTOUS (AV) GORETEX  GRAFT LEFT UPPER ARM;  Surgeon: Conrad Graysville, MD;  Location: [facility];  Service: Vascular;  Laterality: Left;  . THROMBECTOMY W/ EMBOLECTOMY Left 09/14/2017   Procedure: THROMBECTOMY ARTERIOVENOUS GORE-TEX GRAFT LEFT UPPER ARM;  Surgeon: Rosetta Posner, MD;  Location: [facility];  Service: Vascular;  Laterality: Left;  . UPPER EXTREMITY VENOGRAPHY N/A 11/16/2017   Procedure: UPPER EXTREMITY VENOGRAPHY - Right Arm;  Surgeon: Conrad Wallace, MD;  Location: [facility];  Service: Cardiovascular;  Laterality: N/A;     Inpatient Medications: Scheduled Meds: . sodium chloride   Intravenous Once  . acetaminophen  650 mg Oral Once  . amLODipine  5 mg Oral Daily  . calcium acetate  2,001 mg Oral Q supper  . Chlorhexidine Gluconate Cloth  6 each Topical Q0600  . diphenhydrAMINE      . diphenhydrAMINE  25 mg Oral Once  . ezetimibe  10 mg Oral Daily  . feeding supplement (NEPRO CARB STEADY)  237 mL Oral Q1500  . fentaNYL      . isosorbide mononitrate  120 mg Oral Daily  . lidocaine      . midazolam      . multivitamin  1 tablet Oral Daily  . [START ON 10/03/2018] pneumococcal 23 valent vaccine  0.5 mL Intramuscular Tomorrow-1000  . repaglinide  0.5 mg Oral BID AC  . rosuvastatin  10 mg Oral Daily  . sodium chloride flush  3 mL Intravenous  Once  . sodium chloride flush  5 mL Intracatheter Q8H   Continuous Infusions: . cefTRIAXone (ROCEPHIN)  IV 2 g (10/02/18 0330)  . vancomycin     PRN Meds: acetaminophen, albuterol, ondansetron **OR** ondansetron (ZOFRAN) IV  Allergies:    Allergies  Allergen Reactions  . Iodinated Diagnostic Agents Swelling, Rash and Other (See Comments)    Other Reaction: burning to mouth, swelling of lips  . Lipitor [Atorvastatin] Other (See Comments)    Leg pain  . Metoprolol Other (See Comments)    Headaches   . Baclofen Other (See Comments)    Possibly stroke like symptoms    Social History:   Social History   Socioeconomic History  . Marital status: Married    Spouse name: SHEILA  . Number of children: 5  . Years of education: 7  . Highest education level: 12th grade  Occupational History  . Occupation: DISABLED  Social Needs  . Financial resource strain: Not on file  . Food insecurity:    Worry: Never true    Inability: Never true  . Transportation needs:    Medical: No    Non-medical: No  Tobacco Use  . Smoking status: Former Smoker    Types: Cigarettes     Last attempt to quit: 07/04/2014    Years since quitting: 4.2  . Smokeless tobacco: Never Used  . Tobacco comment: "smoked ~ 1 pack/month when I did smoke; never a steady smoker"  Substance and Sexual Activity  . Alcohol use: Yes    Alcohol/week: 0.0 standard drinks    Comment: rare  "2 drinks, 1-2 times/year"  . Drug use: No  . Sexual activity: Not Currently    Birth control/protection: None  Lifestyle  . Physical activity:    Days per week: 0 days    Minutes per session: 0 min  . Stress: Not at all  Relationships  . Social connections:    Talks on phone: More than three times a week    Gets together: More than three times a week    Attends religious service: Never    Active member of club or organization: No    Attends meetings of clubs or organizations: Never    Relationship status: Married  . Intimate partner violence:    Fear of current or ex partner: No    Emotionally abused: No    Physically abused: No    Forced sexual activity: No  Other Topics Concern  . Not on file  Social History Narrative  . Not on file    Family History:   The patient's family history includes HIV in his sister; Hyperlipidemia in his mother; Hypertension in his mother.  ROS:  Please see the history of present illness.  All other ROS reviewed and negative.     Physical Exam/Data:   Vitals:   10/02/18 1352 10/02/18 1403 10/02/18 1430 10/02/18 1500  BP: (!) 175/96 (!) 164/97 (!) 196/97 (!) 157/84  Pulse: 86 88 88 89  Resp: (!) 22 (!) 22 (!) 25 (!) 24  Temp: 98.1 F (36.7 C)     TempSrc: Oral     SpO2: 99%     Weight: 102.9 kg     Height:        Intake/Output Summary (Last 24 hours) at 10/02/2018 1545 Last data filed at 10/02/2018 1159 Gross per 24 hour  Intake 354.53 ml  Output 175 ml  Net 179.53 ml   Filed Weights   10/01/18 1910 10/02/18 0100 10/02/18  1352  Weight: 103.4 kg 101.1 kg 102.9 kg   Body mass index is 36.07 kg/m.   Gen: Obese male, appears comfortable at  rest. HEENT: Conjunctiva and lids normal, oropharynx clear. Neck: Supple, no elevated JVP or carotid bruits, no thyromegaly. Lungs: Clear to auscultation, nonlabored breathing at rest. Cardiac: Regular rate and rhythm, no S3, 2/6 systolic murmur, no pericardial rub. Abdomen:Nontender, PD catheter in place on left, bowel sounds present. Extremities: No pitting edema, distal pulses 2+. Skin: Warm and dry. Musculoskeletal: No kyphosis. Neuropsychiatric: Alert and oriented x3, affect grossly appropriate.  Telemetry:  I personally reviewed telemetry which shows sinus rhythm.  Relevant CV Studies:  Echocardiogram 08/20/2018: Study Conclusions  - Left ventricle: The cavity size was normal. Wall thickness was   increased in a pattern of mild LVH. Systolic function was normal.   The estimated ejection fraction was in the range of 50% to 55%.   Wall motion was normal; there were no regional wall motion   abnormalities. Doppler parameters are consistent with abnormal   left ventricular relaxation (grade 1 diastolic dysfunction).   Doppler parameters are consistent with high ventricular filling   pressure. - Aortic valve: There was mild stenosis. There was trivial   regurgitation. - Mitral valve: Severely calcified annulus.  Impressions:  - Normal LV systolic function; mild LVH: mild diastolic   dysfunction; calcified aortic valve with mild AS (mean gradient   11 mmHg) and trace AI.  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  Total occlusion of the proximal to mid LAD filling late by left to left and right to left collaterals.  First diagonal contains 50% proximal narrowing.  Left main trunk is normal.  Circumflex gives origin to 3 obtuse marginal branches.  The first obtuse marginal contains 50% proximal narrowing.  Right coronary artery is dominant giving PDA and left  ventricular branch.  Diffuse calcified 60% stenosis is noted throughout the mid vessel ending in the distal segment.  No high-grade focal obstruction is seen.  Left ventriculogram and LV pressures were not recorded.  RECOMMENDATIONS:   Angina is related to total occlusion of the proximal to mid LAD.  LIMA to the LAD is atretic.  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  Discussed with patient.  Laboratory Data:  Chemistry Recent Labs  Lab 10/01/18 1927 10/02/18 0702  NA 139 139  K 3.4* 3.8  CL 103 103  CO2 23 23  GLUCOSE 101* 87  BUN 29* 33*  CREATININE 10.09* 11.13*  CALCIUM 9.7 8.9  GFRNONAA 5* 4*  GFRAA 6* 5*  ANIONGAP 13 13    Recent Labs  Lab 10/01/18 1927  PROT 7.1  ALBUMIN 3.3*  AST 20  ALT 11  ALKPHOS 70  BILITOT 0.7   Hematology Recent Labs  Lab 10/01/18 1927 10/02/18 0702  WBC 6.9 5.8  RBC 3.40* 2.84*  HGB 9.0* 7.7*  HCT 31.1* 25.4*  MCV 91.5 89.4  MCH 26.5 27.1  MCHC 28.9* 30.3  RDW 17.5* 17.7*  PLT 195 175   Cardiac Enzymes Recent Labs  Lab 10/02/18 0039 10/02/18 0702  TROPONINI 0.32* 0.43*    Recent Labs  Lab 10/01/18 1934 10/02/18 0046  TROPIPOC 0.22* 0.36*    BNP  Recent Labs  Lab 10/02/18 0039  BNP 3,561.2*    Radiology/Studies:  Ct Abdomen Pelvis Wo Contrast  Result Date: 10/01/2018 CLINICAL DATA:  Shortness of breath.  Abdominal distention. EXAM: CT ABDOMEN AND PELVIS WITHOUT CONTRAST TECHNIQUE: Multidetector CT imaging of the abdomen and pelvis was performed following the standard protocol without IV contrast. COMPARISON:  None. FINDINGS: Lower chest: Heart is enlarged. Coronary artery calcifications are present. Mitral valve annular calcifications are present. Atherosclerotic changes are noted in the descending aorta. Mild bibasilar airspace disease is  present. No significant pleural or pericardial effusion is present. Hepatobiliary: No focal liver abnormality is seen. No gallstones, gallbladder wall thickening, or biliary dilatation. Pancreas: Unremarkable. No pancreatic ductal dilatation or surrounding inflammatory changes. Spleen: Normal in size without focal abnormality. Adrenals/Urinary Tract: Adrenal glands are normal bilaterally. 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 Ureters are within normal limits. The urinary bladder is collapsed. Stomach/Bowel: A stomach and duodenum are within normal limits. Proximal small bowel is unremarkable. There is focal inflammation of bowel loops of small bowel adjacent to a fluid collection along the anterior peritoneum on the right. The right anterior peritoneal fluid collection measures 12.9 x 6.5 x 14.6 cm. There is no obstruction. Terminal ileum is otherwise normal. The appendix is visualized and normal. The ascending and transverse colon is mostly collapsed. Diverticular changes are present in the descending and sigmoid colon without focal inflammation. Vascular/Lymphatic: Extensive vascular calcifications are present. There is no aneurysm. No significant adenopathy is present. Reproductive: Prostate is unremarkable. Other: Peritoneal dialysis catheter is in place. The anterior wall fluid collection seen on the previous study is stable. This is just anterior to the new collection. Elder collection measures 8.6 x 5.5 x 7.8 cm. There is some inflammatory change the subcutaneous tissues inferiorly. Musculoskeletal: Vertebral body heights and alignment are maintained. No focal lytic or blastic lesions are present. IMPRESSION: 1. New ventral peritoneal fluid collection measures 12.9 x 6.5 x 14.6 cm. This is worrisome for a loculated fluid collection associated with dialysis or developing abscess related to bowel disease. It appears separate from the other anterior abdominal wall fluid collection. 2. The more remote  anterior wall fluid collection is stable. 3. Inflammatory changes of bowel adjacent to the fluid collection, likely reflecting adhesions. There is no obstruction. This is in the region of the previous hernia. 4. Dialysis catheter. 5. Aortic Atherosclerosis (ICD10-I70.0). Extensive vascular calcifications are present without aneurysm. 6. Cystic renal disease of dialysis. 7. Cardiomegaly without failure. 8. Coronary artery disease. Electronically Signed   By: San Morelle M.D.   On: 10/01/2018 21:40   Dg Chest Port 1 View  Result Date: 10/01/2018 CLINICAL DATA:  Dyspnea for the past 2 hours. EXAM: PORTABLE CHEST 1 VIEW COMPARISON:  08/19/2018 FINDINGS: Stable cardiomegaly with aortic atherosclerosis. Dual lumen dialysis catheter terminates in the right atrium unchanged in position. Median sternotomy sutures and post surgical clips project over the cardiac silhouette. No overt pulmonary edema. Hazy appearance at the left lung base may represent a small layering left effusion. No acute osseous abnormality. Vascular clips project over the superior mediastinum as well as a vascular stent projecting over the right upper thorax in the region of the subclavian artery. IMPRESSION: Stable cardiomegaly. Hazy opacity at the left lung base may represent a small layering left effusion, new since prior. No overt pulmonary edema. No other change of note. Electronically Signed   By: Ashley Royalty M.D.   On: 10/01/2018 20:56   Ct Image Guided Fluid Drain By  Catheter  Result Date: 10/02/2018 INDICATION: Right lower quadrant superficial and deep fluid collections EXAM: CT GUIDED DRAINAGE OF RIGHT LOWER QUADRANT SUPERFICIAL AND DEEP FLUID COLLECTIONS MEDICATIONS: The patient is currently admitted to the hospital and receiving intravenous antibiotics. The antibiotics were administered within an appropriate time frame prior to the initiation of the procedure. ANESTHESIA/SEDATION: 2.0 mg IV Versed 100 mcg IV Fentanyl Moderate  Sedation Time:  13 The patient was continuously monitored during the procedure by the interventional radiology nurse under my direct supervision. COMPLICATIONS: None immediate. TECHNIQUE: Informed written consent was obtained from the patient after a thorough discussion of the procedural risks, benefits and alternatives. All questions were addressed. Maximal Sterile Barrier Technique was utilized including caps, mask, sterile gowns, sterile gloves, sterile drape, hand hygiene and skin antiseptic. A timeout was performed prior to the initiation of the procedure. PROCEDURE: Previous imaging reviewed. Patient positioned supine. Noncontrast localization CT performed. The right lower quadrant superficial and deep abdominal wall fluid collections were localized. Overlying skin marked for drainage of both collections. Under sterile conditions and local anesthesia, 18 gauge 10 cm access needle were advanced into both the superficial and deep collections. Needle positions confirmed with CT. Syringe aspiration yielded thin dark fluid from both collections suspicious for chronic hematomas. Amplatz guidewire inserted followed by tract dilatation to insert 2 10 French drains. Both drain catheter positions confirmed with CT. Catheters secured with Prolene sutures and connected to external suction bulb. Patient tolerated the procedure well. No immediate complication. Sterile dressings applied. FINDINGS: Imaging confirms needle placement into the right lower quadrant superficial and deep fluid collections for drain placements IMPRESSION: Successful CT-guided drain placements (2) within the right lower quadrant superficial and deep abdominal wall fluid collections. Electronically Signed   By: Jerilynn Mages.  Shick M.D.   On: 10/02/2018 14:15   Ct Image Guided Fluid Drain By Catheter  Result Date: 10/02/2018 INDICATION: Right lower quadrant superficial and deep fluid collections EXAM: CT GUIDED DRAINAGE OF RIGHT LOWER QUADRANT SUPERFICIAL AND  DEEP FLUID COLLECTIONS MEDICATIONS: The patient is currently admitted to the hospital and receiving intravenous antibiotics. The antibiotics were administered within an appropriate time frame prior to the initiation of the procedure. ANESTHESIA/SEDATION: 2.0 mg IV Versed 100 mcg IV Fentanyl Moderate Sedation Time:  13 The patient was continuously monitored during the procedure by the interventional radiology nurse under my direct supervision. COMPLICATIONS: None immediate. TECHNIQUE: Informed written consent was obtained from the patient after a thorough discussion of the procedural risks, benefits and alternatives. All questions were addressed. Maximal Sterile Barrier Technique was utilized including caps, mask, sterile gowns, sterile gloves, sterile drape, hand hygiene and skin antiseptic. A timeout was performed prior to the initiation of the procedure. PROCEDURE: Previous imaging reviewed. Patient positioned supine. Noncontrast localization CT performed. The right lower quadrant superficial and deep abdominal wall fluid collections were localized. Overlying skin marked for drainage of both collections. Under sterile conditions and local anesthesia, 18 gauge 10 cm access needle were advanced into both the superficial and deep collections. Needle positions confirmed with CT. Syringe aspiration yielded thin dark fluid from both collections suspicious for chronic hematomas. Amplatz guidewire inserted followed by tract dilatation to insert 2 10 French drains. Both drain catheter positions confirmed with CT. Catheters secured with Prolene sutures and connected to external suction bulb. Patient tolerated the procedure well. No immediate complication. Sterile dressings applied. FINDINGS: Imaging confirms needle placement into the right lower quadrant superficial and deep fluid collections for drain placements IMPRESSION: Successful CT-guided drain placements (2)  within the right lower quadrant superficial and deep  abdominal wall fluid collections. Electronically Signed   By: Jerilynn Mages.  Shick M.D.   On: 10/02/2018 14:15    Assessment and Plan:   1.  History of recurring left-sided chest pain with typical and atypical features.  He has angina at baseline with CAD documented in May 2019.  He has an atretic LIMA to LAD with occluded proximal to mid LAD that fills by left to left and right to left collaterals, otherwise moderate diffuse disease.  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  2.  Mild troponin I elevation, 0.32 and 0.43.  We will continue to cycle enzymes for further trend, at this point would suspect demand ischemia particularly in the setting of ESRD and known CAD.  3.  ESRD on hemodialysis.  Recent peritoneal dialysis catheter placed.  He has a history of failed renal transplantation.  4.  Essential hypertension.  5.  History of atrial flutter ablation.  6.  History of cardiomyopathy with LVEF improved to the range of 50 to 55% as of echocardiogram in January of this year.  Discussed with patient.  Baseline cardiac regimen includes aspirin, Plavix, Norvasc, Zetia, high-dose Imdur, and Crestor.  Of note, Imdur was increased to 120 mg daily during admission in January.  Not clear that he would be a good candidate for Ranexa in light of ESRD.  We will try low-dose beta-blocker as antianginal (history of headaches on metoprolol so will avoid).  There may also be further room to uptitrate Norvasc as antianginal.  Signed, Rozann Lesches, MD  10/02/2018 3:45 PM

## 2018-10-02 NOTE — Procedures (Signed)
RLQ superficial and deep abdominal fld collections  S/p CT drain x 2 cxs sent No comp Stable Dark thin bloody fld aspirated x 2 c/w old hematomas Full report in pacs

## 2018-10-03 ENCOUNTER — Other Ambulatory Visit: Payer: Self-pay

## 2018-10-03 DIAGNOSIS — L7634 Postprocedural seroma of skin and subcutaneous tissue following other procedure: Secondary | ICD-10-CM | POA: Diagnosis present

## 2018-10-03 DIAGNOSIS — Z951 Presence of aortocoronary bypass graft: Secondary | ICD-10-CM | POA: Diagnosis not present

## 2018-10-03 DIAGNOSIS — I4892 Unspecified atrial flutter: Secondary | ICD-10-CM | POA: Diagnosis present

## 2018-10-03 DIAGNOSIS — R1011 Right upper quadrant pain: Secondary | ICD-10-CM | POA: Diagnosis not present

## 2018-10-03 DIAGNOSIS — E118 Type 2 diabetes mellitus with unspecified complications: Secondary | ICD-10-CM | POA: Diagnosis not present

## 2018-10-03 DIAGNOSIS — I5032 Chronic diastolic (congestive) heart failure: Secondary | ICD-10-CM | POA: Diagnosis present

## 2018-10-03 DIAGNOSIS — Z940 Kidney transplant status: Secondary | ICD-10-CM | POA: Diagnosis not present

## 2018-10-03 DIAGNOSIS — E785 Hyperlipidemia, unspecified: Secondary | ICD-10-CM | POA: Diagnosis present

## 2018-10-03 DIAGNOSIS — I25119 Atherosclerotic heart disease of native coronary artery with unspecified angina pectoris: Secondary | ICD-10-CM | POA: Diagnosis not present

## 2018-10-03 DIAGNOSIS — Z87442 Personal history of urinary calculi: Secondary | ICD-10-CM | POA: Diagnosis not present

## 2018-10-03 DIAGNOSIS — N186 End stage renal disease: Secondary | ICD-10-CM | POA: Diagnosis present

## 2018-10-03 DIAGNOSIS — I2582 Chronic total occlusion of coronary artery: Secondary | ICD-10-CM | POA: Diagnosis present

## 2018-10-03 DIAGNOSIS — R188 Other ascites: Secondary | ICD-10-CM | POA: Diagnosis present

## 2018-10-03 DIAGNOSIS — T8612 Kidney transplant failure: Secondary | ICD-10-CM | POA: Diagnosis present

## 2018-10-03 DIAGNOSIS — I10 Essential (primary) hypertension: Secondary | ICD-10-CM | POA: Diagnosis not present

## 2018-10-03 DIAGNOSIS — M109 Gout, unspecified: Secondary | ICD-10-CM | POA: Diagnosis present

## 2018-10-03 DIAGNOSIS — Y830 Surgical operation with transplant of whole organ as the cause of abnormal reaction of the patient, or of later complication, without mention of misadventure at the time of the procedure: Secondary | ICD-10-CM | POA: Diagnosis present

## 2018-10-03 DIAGNOSIS — N2581 Secondary hyperparathyroidism of renal origin: Secondary | ICD-10-CM | POA: Diagnosis present

## 2018-10-03 DIAGNOSIS — M1990 Unspecified osteoarthritis, unspecified site: Secondary | ICD-10-CM | POA: Diagnosis present

## 2018-10-03 DIAGNOSIS — I120 Hypertensive chronic kidney disease with stage 5 chronic kidney disease or end stage renal disease: Secondary | ICD-10-CM | POA: Diagnosis not present

## 2018-10-03 DIAGNOSIS — Y838 Other surgical procedures as the cause of abnormal reaction of the patient, or of later complication, without mention of misadventure at the time of the procedure: Secondary | ICD-10-CM | POA: Diagnosis present

## 2018-10-03 DIAGNOSIS — I428 Other cardiomyopathies: Secondary | ICD-10-CM | POA: Diagnosis present

## 2018-10-03 DIAGNOSIS — E1122 Type 2 diabetes mellitus with diabetic chronic kidney disease: Secondary | ICD-10-CM | POA: Diagnosis present

## 2018-10-03 DIAGNOSIS — I252 Old myocardial infarction: Secondary | ICD-10-CM | POA: Diagnosis not present

## 2018-10-03 DIAGNOSIS — K651 Peritoneal abscess: Secondary | ICD-10-CM | POA: Diagnosis present

## 2018-10-03 DIAGNOSIS — I358 Other nonrheumatic aortic valve disorders: Secondary | ICD-10-CM | POA: Diagnosis present

## 2018-10-03 DIAGNOSIS — R079 Chest pain, unspecified: Secondary | ICD-10-CM | POA: Diagnosis not present

## 2018-10-03 DIAGNOSIS — Z992 Dependence on renal dialysis: Secondary | ICD-10-CM | POA: Diagnosis not present

## 2018-10-03 DIAGNOSIS — I132 Hypertensive heart and chronic kidney disease with heart failure and with stage 5 chronic kidney disease, or end stage renal disease: Secondary | ICD-10-CM | POA: Diagnosis present

## 2018-10-03 DIAGNOSIS — R0602 Shortness of breath: Secondary | ICD-10-CM | POA: Diagnosis not present

## 2018-10-03 DIAGNOSIS — Z79899 Other long term (current) drug therapy: Secondary | ICD-10-CM | POA: Diagnosis not present

## 2018-10-03 DIAGNOSIS — R7989 Other specified abnormal findings of blood chemistry: Secondary | ICD-10-CM | POA: Diagnosis not present

## 2018-10-03 DIAGNOSIS — D631 Anemia in chronic kidney disease: Secondary | ICD-10-CM | POA: Diagnosis not present

## 2018-10-03 LAB — CBC WITH DIFFERENTIAL/PLATELET
ABS IMMATURE GRANULOCYTES: 0.01 10*3/uL (ref 0.00–0.07)
BASOS ABS: 0.1 10*3/uL (ref 0.0–0.1)
Basophils Relative: 1 %
Eosinophils Absolute: 0.7 10*3/uL — ABNORMAL HIGH (ref 0.0–0.5)
Eosinophils Relative: 13 %
HCT: 30.2 % — ABNORMAL LOW (ref 39.0–52.0)
Hemoglobin: 9.4 g/dL — ABNORMAL LOW (ref 13.0–17.0)
Immature Granulocytes: 0 %
Lymphocytes Relative: 22 %
Lymphs Abs: 1.2 10*3/uL (ref 0.7–4.0)
MCH: 27.5 pg (ref 26.0–34.0)
MCHC: 31.1 g/dL (ref 30.0–36.0)
MCV: 88.3 fL (ref 80.0–100.0)
Monocytes Absolute: 0.6 10*3/uL (ref 0.1–1.0)
Monocytes Relative: 11 %
NEUTROS ABS: 2.8 10*3/uL (ref 1.7–7.7)
Neutrophils Relative %: 53 %
Platelets: 175 10*3/uL (ref 150–400)
RBC: 3.42 MIL/uL — ABNORMAL LOW (ref 4.22–5.81)
RDW: 17.6 % — ABNORMAL HIGH (ref 11.5–15.5)
WBC: 5.4 10*3/uL (ref 4.0–10.5)
nRBC: 0 % (ref 0.0–0.2)

## 2018-10-03 LAB — TYPE AND SCREEN
ABO/RH(D): O POS
ANTIBODY SCREEN: POSITIVE
DAT, IgG: NEGATIVE
Unit division: 0

## 2018-10-03 LAB — BPAM RBC
Blood Product Expiration Date: 202004102359
ISSUE DATE / TIME: 202003071715
Unit Type and Rh: 5100

## 2018-10-03 LAB — VITAMIN B12: VITAMIN B 12: 330 pg/mL (ref 180–914)

## 2018-10-03 LAB — CBC
HEMATOCRIT: 31.1 % — AB (ref 39.0–52.0)
Hemoglobin: 9.4 g/dL — ABNORMAL LOW (ref 13.0–17.0)
MCH: 26.7 pg (ref 26.0–34.0)
MCHC: 30.2 g/dL (ref 30.0–36.0)
MCV: 88.4 fL (ref 80.0–100.0)
NRBC: 0 % (ref 0.0–0.2)
Platelets: 182 10*3/uL (ref 150–400)
RBC: 3.52 MIL/uL — ABNORMAL LOW (ref 4.22–5.81)
RDW: 17.5 % — ABNORMAL HIGH (ref 11.5–15.5)
WBC: 5.2 10*3/uL (ref 4.0–10.5)

## 2018-10-03 LAB — TROPONIN I: TROPONIN I: 0.29 ng/mL — AB (ref <0.03)

## 2018-10-03 MED ORDER — HEPARIN 1000 UNIT/ML FOR PERITONEAL DIALYSIS
1500.0000 [IU] | INTRAMUSCULAR | Status: DC | PRN
  Filled 2018-10-03: qty 1.5

## 2018-10-03 NOTE — Care Management Obs Status (Signed)
[name] NOTIFICATION   Patient Details  Name: Patrick [name] MRN: [id number] Date of Birth: [birth year]   Medicare Observation Status Notification Given:  Yes    Carles Collet, RN 10/03/2018, 11:11 AM

## 2018-10-03 NOTE — Progress Notes (Signed)
Progress Note  Patient Name: Patrick [name] Date of Encounter: 10/03/2018  Primary Cardiologist: Dr. Dorris Carnes  Subjective   No chest pain this morning.  Tolerated hemodialysis yesterday.  Inpatient Medications    Scheduled Meds: . sodium chloride   Intravenous Once  . acetaminophen  650 mg Oral Once  . amLODipine  5 mg Oral [name]  . bisoprolol  5 mg Oral [name]  . calcium acetate  2,001 mg Oral Q supper  . Chlorhexidine Gluconate Cloth  6 each Topical Q0600  . diphenhydrAMINE  25 mg Oral Once  . ezetimibe  10 mg Oral [name]  . feeding supplement (NEPRO CARB STEADY)  237 mL Oral Q1500  . isosorbide mononitrate  120 mg Oral [name]  . multivitamin  1 tablet Oral [name]  . pneumococcal 23 valent vaccine  0.5 mL Intramuscular Tomorrow-1000  . repaglinide  0.5 mg Oral BID AC  . rosuvastatin  10 mg Oral [name]  . sodium chloride flush  3 mL Intravenous Once  . sodium chloride flush  5 mL Intracatheter Q8H   Continuous Infusions: . sodium chloride    . sodium chloride    . cefTRIAXone (ROCEPHIN)  IV 2 g (10/03/18 0023)  . vancomycin     PRN Meds: sodium chloride, sodium chloride, acetaminophen, albuterol, alteplase, heparin, heparin, lidocaine (PF), lidocaine-prilocaine, ondansetron **OR** ondansetron (ZOFRAN) IV, pentafluoroprop-tetrafluoroeth, senna-docusate   Vital Signs    Vitals:   10/02/18 1800 10/02/18 1807 10/02/18 2300 10/03/18 0907  BP: (!) 153/66 (!) 159/76 (!) 154/75 133/67  Pulse: 85 88 83 79  Resp: (!) 24 (!) 25 18 18   Temp:  97.7 F (36.5 C) 97.9 F (36.6 C) 98.7 F (37.1 C)  TempSrc:  Oral Oral Oral  SpO2:  98% 94% 97%  Weight:  98.3 kg    Height:        Intake/Output Summary (Last 24 hours) at 10/03/2018 1023 Last data filed at 10/03/2018 0600 Gross per 24 hour  Intake 350 ml  Output 4225 ml  Net -3875 ml   Filed Weights   10/02/18 0100 10/02/18 1352 10/02/18 1807  Weight: 101.1 kg 102.9 kg 98.3 kg    Telemetry    Sinus rhythm.  Personally  reviewed.  ECG    Tracing from 10/01/2018 showed sinus tachycardia with Q in lead III, nonspecific ST-T changes.  Personally reviewed.  Physical Exam   GEN:  Obese male.  No acute distress.   Neck: No JVD. Cardiac: RRR, 2/6 systolic murmur, no gallop. Respiratory: Nonlabored. Clear to auscultation bilaterally. GI: Nontender, bowel sounds present.  PD catheter in place on left. MS: No edema; No deformity. Neuro:  Nonfocal. Psych: Alert and oriented x 3. Normal affect.  Labs    Chemistry Recent Labs  Lab 10/01/18 1927 10/02/18 0702 10/03/18 0351  NA 139 139 136  134*  K 3.4* 3.8 3.6  3.6  CL 103 103 100  99  CO2 23 23 24  25   GLUCOSE 101* 87 127*  125*  BUN 29* 33* 19  19  CREATININE 10.09* 11.13* 7.37*  7.25*  CALCIUM 9.7 8.9 9.1  9.1  PROT 7.1  --   --   ALBUMIN 3.3*  --  2.9*  2.9*  AST 20  --   --   ALT 11  --   --   ALKPHOS 70  --   --   BILITOT 0.7  --   --   GFRNONAA 5* 4* 7*  7*  GFRAA  6* 5* 8*  8*  ANIONGAP 13 13 12  10      Hematology Recent Labs  Lab 10/01/18 1927 10/02/18 0702 10/03/18 0351  WBC 6.9 5.8 5.4  5.2  RBC 3.40* 2.84* 3.42*  3.52*  HGB 9.0* 7.7* 9.4*  9.4*  HCT 31.1* 25.4* 30.2*  31.1*  MCV 91.5 89.4 88.3  88.4  MCH 26.5 27.1 27.5  26.7  MCHC 28.9* 30.3 31.1  30.2  RDW 17.5* 17.7* 17.6*  17.5*  PLT 195 175 175  182    Cardiac Enzymes Recent Labs  Lab 10/02/18 0039 10/02/18 0702 10/03/18 0351  TROPONINI 0.32* 0.43* 0.29*    Recent Labs  Lab 10/01/18 1934 10/02/18 0046  TROPIPOC 0.22* 0.36*     BNP Recent Labs  Lab 10/02/18 0039  BNP 3,561.2*     Radiology    Ct Abdomen Pelvis Wo Contrast  Result Date: 10/01/2018 CLINICAL DATA:  Shortness of breath.  Abdominal distention. EXAM: CT ABDOMEN AND PELVIS WITHOUT CONTRAST TECHNIQUE: Multidetector CT imaging of the abdomen and pelvis was performed following the standard protocol without IV contrast. COMPARISON:  None. FINDINGS: Lower chest: Heart is  enlarged. Coronary artery calcifications are present. Mitral valve annular calcifications are present. Atherosclerotic changes are noted in the descending aorta. Mild bibasilar airspace disease is present. No significant pleural or pericardial effusion is present. Hepatobiliary: No focal liver abnormality is seen. No gallstones, gallbladder wall thickening, or biliary dilatation. Pancreas: Unremarkable. No pancreatic ductal dilatation or surrounding inflammatory changes. Spleen: Normal in size without focal abnormality. Adrenals/Urinary Tract: Adrenal glands are normal bilaterally. 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 Ureters are within normal limits. The urinary bladder is collapsed. Stomach/Bowel: A stomach and duodenum are within normal limits. Proximal small bowel is unremarkable. There is focal inflammation of bowel loops of small bowel adjacent to a fluid collection along the anterior peritoneum on the right. The right anterior peritoneal fluid collection measures 12.9 x 6.5 x 14.6 cm. There is no obstruction. Terminal ileum is otherwise normal. The appendix is visualized and normal. The ascending and transverse colon is mostly collapsed. Diverticular changes are present in the descending and sigmoid colon without focal inflammation. Vascular/Lymphatic: Extensive vascular calcifications are present. There is no aneurysm. No significant adenopathy is present. Reproductive: Prostate is unremarkable. Other: Peritoneal dialysis catheter is in place. The anterior wall fluid collection seen on the previous study is stable. This is just anterior to the new collection. Elder collection measures 8.6 x 5.5 x 7.8 cm. There is some inflammatory change the subcutaneous tissues inferiorly. Musculoskeletal: Vertebral body heights and alignment are maintained. No focal lytic or blastic lesions are present. IMPRESSION: 1. New ventral peritoneal fluid collection measures 12.9 x 6.5 x 14.6 cm. This is worrisome for a  loculated fluid collection associated with dialysis or developing abscess related to bowel disease. It appears separate from the other anterior abdominal wall fluid collection. 2. The more remote anterior wall fluid collection is stable. 3. Inflammatory changes of bowel adjacent to the fluid collection, likely reflecting adhesions. There is no obstruction. This is in the region of the previous hernia. 4. Dialysis catheter. 5. Aortic Atherosclerosis (ICD10-I70.0). Extensive vascular calcifications are present without aneurysm. 6. Cystic renal disease of dialysis. 7. Cardiomegaly without failure. 8. Coronary artery disease. Electronically Signed   By: San Morelle M.D.   On: 10/01/2018 21:40   Dg Chest Port 1 View  Result Date: 10/01/2018 CLINICAL DATA:  Dyspnea for the past 2 hours. EXAM: PORTABLE CHEST 1  VIEW COMPARISON:  08/19/2018 FINDINGS: Stable cardiomegaly with aortic atherosclerosis. Dual lumen dialysis catheter terminates in the right atrium unchanged in position. Median sternotomy sutures and post surgical clips project over the cardiac silhouette. No overt pulmonary edema. Hazy appearance at the left lung base may represent a small layering left effusion. No acute osseous abnormality. Vascular clips project over the superior mediastinum as well as a vascular stent projecting over the right upper thorax in the region of the subclavian artery. IMPRESSION: Stable cardiomegaly. Hazy opacity at the left lung base may represent a small layering left effusion, new since prior. No overt pulmonary edema. No other change of note. Electronically Signed   By: Ashley Royalty M.D.   On: 10/01/2018 20:56   Ct Image Guided Fluid Drain By Catheter  Result Date: 10/02/2018 INDICATION: Right lower quadrant superficial and deep fluid collections EXAM: CT GUIDED DRAINAGE OF RIGHT LOWER QUADRANT SUPERFICIAL AND DEEP FLUID COLLECTIONS MEDICATIONS: The patient is currently admitted to the hospital and receiving  intravenous antibiotics. The antibiotics were administered within an appropriate time frame prior to the initiation of the procedure. ANESTHESIA/SEDATION: 2.0 mg IV Versed 100 mcg IV Fentanyl Moderate Sedation Time:  13 The patient was continuously monitored during the procedure by the interventional radiology nurse under my direct supervision. COMPLICATIONS: None immediate. TECHNIQUE: Informed written consent was obtained from the patient after a thorough discussion of the procedural risks, benefits and alternatives. All questions were addressed. Maximal Sterile Barrier Technique was utilized including caps, mask, sterile gowns, sterile gloves, sterile drape, hand hygiene and skin antiseptic. A timeout was performed prior to the initiation of the procedure. PROCEDURE: Previous imaging reviewed. Patient positioned supine. Noncontrast localization CT performed. The right lower quadrant superficial and deep abdominal wall fluid collections were localized. Overlying skin marked for drainage of both collections. Under sterile conditions and local anesthesia, 18 gauge 10 cm access needle were advanced into both the superficial and deep collections. Needle positions confirmed with CT. Syringe aspiration yielded thin dark fluid from both collections suspicious for chronic hematomas. Amplatz guidewire inserted followed by tract dilatation to insert 2 10 French drains. Both drain catheter positions confirmed with CT. Catheters secured with Prolene sutures and connected to external suction bulb. Patient tolerated the procedure well. No immediate complication. Sterile dressings applied. FINDINGS: Imaging confirms needle placement into the right lower quadrant superficial and deep fluid collections for drain placements IMPRESSION: Successful CT-guided drain placements (2) within the right lower quadrant superficial and deep abdominal wall fluid collections. Electronically Signed   By: Jerilynn Mages.  Shick M.D.   On: 10/02/2018 14:15    Ct Image Guided Fluid Drain By Catheter  Result Date: 10/02/2018 INDICATION: Right lower quadrant superficial and deep fluid collections EXAM: CT GUIDED DRAINAGE OF RIGHT LOWER QUADRANT SUPERFICIAL AND DEEP FLUID COLLECTIONS MEDICATIONS: The patient is currently admitted to the hospital and receiving intravenous antibiotics. The antibiotics were administered within an appropriate time frame prior to the initiation of the procedure. ANESTHESIA/SEDATION: 2.0 mg IV Versed 100 mcg IV Fentanyl Moderate Sedation Time:  13 The patient was continuously monitored during the procedure by the interventional radiology nurse under my direct supervision. COMPLICATIONS: None immediate. TECHNIQUE: Informed written consent was obtained from the patient after a thorough discussion of the procedural risks, benefits and alternatives. All questions were addressed. Maximal Sterile Barrier Technique was utilized including caps, mask, sterile gowns, sterile gloves, sterile drape, hand hygiene and skin antiseptic. A timeout was performed prior to the initiation of the procedure. PROCEDURE: Previous imaging reviewed.  Patient positioned supine. Noncontrast localization CT performed. The right lower quadrant superficial and deep abdominal wall fluid collections were localized. Overlying skin marked for drainage of both collections. Under sterile conditions and local anesthesia, 18 gauge 10 cm access needle were advanced into both the superficial and deep collections. Needle positions confirmed with CT. Syringe aspiration yielded thin dark fluid from both collections suspicious for chronic hematomas. Amplatz guidewire inserted followed by tract dilatation to insert 2 10 French drains. Both drain catheter positions confirmed with CT. Catheters secured with Prolene sutures and connected to external suction bulb. Patient tolerated the procedure well. No immediate complication. Sterile dressings applied. FINDINGS: Imaging confirms needle  placement into the right lower quadrant superficial and deep fluid collections for drain placements IMPRESSION: Successful CT-guided drain placements (2) within the right lower quadrant superficial and deep abdominal wall fluid collections. Electronically Signed   By: Jerilynn Mages.  Shick M.D.   On: 10/02/2018 14:15    Cardiac Studies   Echocardiogram 08/20/2018: Study Conclusions  - Left ventricle: The cavity size was normal. Wall thickness was increased in a pattern of mild LVH. Systolic function was normal. The estimated ejection fraction was in the range of 50% to 55%. Wall motion was normal; there were no regional wall motion abnormalities. Doppler parameters are consistent with abnormal left ventricular relaxation (grade 1 diastolic dysfunction). Doppler parameters are consistent with high ventricular filling pressure. - Aortic valve: There was mild stenosis. There was trivial regurgitation. - Mitral valve: Severely calcified annulus.  Impressions:  - Normal LV systolic function; mild LVH: mild diastolic dysfunction; calcified aortic valve with mild AS (mean gradient 11 mmHg) and trace AI.  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  Total occlusion of the proximal to mid LAD filling late by left to left and right to left collaterals.First diagonal contains 50% proximal narrowing.  Left main trunk is normal.  Circumflex gives origin to 3 obtuse marginal branches. The first obtuse marginal contains 50% proximal narrowing.  Right coronary artery is dominant giving PDA and left ventricular branch. Diffuse calcified 60% stenosis is noted throughout the mid vessel ending in the distal segment. No high-grade focal obstruction is seen.  Left ventriculogram and LV pressures were not recorded.  RECOMMENDATIONS:   Angina is related to total  occlusion of the proximal to mid LAD. LIMA to the LAD is atretic. 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  Discussed with patient.  Patient Profile     63 y.o. 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  He is being followed by cardiology for chest pain and abnormal troponin I levels.  Assessment & Plan    1.  History of recurrent left-sided chest pain with typical and atypical features.  He has known multivessel CAD with most recent cardiac catheterization in May 2019 showing an atretic LIMA to LAD with occluded proximal to mid LAD that fills by left to left and right to left collaterals, otherwise moderate diffuse disease.  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  2.  Mild troponin I elevation and relatively flat pattern not suggestive of ACS, more likely demand ischemia in the setting of ESRD and known CAD.  3.  ESRD on hemodialysis.  Recent peritoneal dialysis catheter placed.  He has a history of failed renal transplantation.  4.  Essential hypertension.  5.  History of atrial flutter ablation.  No recent arrhythmias.  6.  History cardiomyopathy with LVEF improved at 50 to 55% as of echocardiogram this January.  Continue medical therapy which now includes aspirin, Plavix, Norvasc, Zetia, high-dose Imdur, and Crestor.  Zebeta was added yesterday for additional antianginal benefit.  He has prior history of intolerance to metoprolol due to headaches.  No further ischemic testing is planned at this time.  Signed, Rozann Lesches, MD  10/03/2018, 10:23 AM

## 2018-10-03 NOTE — Final Consult Note (Addendum)
Consultant Final Sign-Off Note    Assessment/Final recommendations  Patrick [name] is a 63 y.o. male followed by me for:  S/P robotic incarcerated incisional hernia repair with mesh, extensive LOA, and peritoneal dialysis catheter placement with omentopexy(RUQ)(09/13/18, Dr. Raul Del) - CT scan abd/pel showslargeseroma versus abscess, part of which appears to be above the fascia and part appears to be below the fascia in the right lower quadrant near repair. There is inflammatory changes of the bowel adjacent to the fluid collection likely reflecting adhesions, no obstruction - s/p IR drains x 2 which showed Dark thin bloody fld aspirated x 2 c/w old hematomas - no surgical intervention indicated at this time - WBC WNL, VSS, afebrile - ultimately he willfollow-up with Dr. Raul Del  Wound care (if applicable):    Diet at discharge: per primary team   Activity at discharge: per primary team   Follow-up appointment:  Dr. Raul Del   Pending results:  Unresulted Labs (From admission, onward)    Start     Ordered   10/01/18 1912  Urinalysis, Routine w reflex microscopic  ONCE - STAT,   STAT     10/01/18 1912           Medication recommendations:   Other recommendations: per IR    Thank you for allowing [place] to participate in the care of your patient!  Please consult [place] again if you have further needs for your patient.  Kalman Drape 10/03/2018 9:41 AM    Subjective   CC: mild soreness at drain sites  No abdominal pain, nausea or vomiting. He is having flatus. No fever or chills. No issues overnight.   Objective  Vital signs in last 24 hours: Temp:  [97.6 F (36.4 C)-98.7 F (37.1 C)] 98.7 F (37.1 C) (03/08 0907) Pulse Rate:  [67-95] 79 (03/08 0907) Resp:  [18-28] 18 (03/08 0907) BP: (125-196)/(63-97) 133/67 (03/08 0907) SpO2:  [94 %-100 %] 97 % (03/08 0907) Weight:  [98.3 kg-102.9 kg] 98.3 kg (03/07 1807)  PE: Gen:  Alert, NAD, pleasant, cooperative Pulm:   Rate and effort normal Abd: obese,soft, nondistended, mild TTP at drain site.No rebound or guarding, no peritonitis healing incisions from robotic surgery. Drains x 2 with thin, dark, bloody drainage Skin: warm and dry   Pertinent labs and Studies: Recent Labs    10/01/18 10-29-1925 10/02/18 0702 10/03/18 0351  WBC 6.9 5.8 5.4  5.2  HGB 9.0* 7.7* 9.4*  9.4*  HCT 31.1* 25.4* 30.2*  31.1*   BMET Recent Labs    10/02/18 0702 10/03/18 0351  NA 139 136  134*  K 3.8 3.6  3.6  CL 103 100  99  CO2 23 24  25   GLUCOSE 87 127*  125*  BUN 33* 19  19  CREATININE 11.13* 7.37*  7.25*  CALCIUM 8.9 9.1  9.1   No results for input(s): LABURIN in the last 72 hours. Results for orders placed or performed during the hospital encounter of 10/01/18  Culture, blood (routine x 2)     Status: None (Preliminary result)   Collection Time: 10/02/18 12:25 AM  Result Value Ref Range Status   Specimen Description BLOOD LEFT ARM  Final   Special Requests   Final    BOTTLES DRAWN AEROBIC AND ANAEROBIC Blood Culture adequate volume   Culture   Final    NO GROWTH 1 DAY Performed at [facility], [place] [address] [place], [place] [address]    Report Status PENDING  Incomplete  Culture, blood (routine x 2)     Status: None (Preliminary result)   Collection Time: 10/02/18 12:40 AM  Result Value Ref Range Status   Specimen Description BLOOD LEFT HAND  Final   Special Requests   Final    BOTTLES DRAWN AEROBIC ONLY Blood Culture results may not be optimal due to an excessive volume of blood received in culture bottles   Culture   Final    NO GROWTH 1 DAY Performed at [facility], [place] [address], [place], [place] [address]    Report Status PENDING  Incomplete  MRSA PCR Screening     Status: None   Collection Time: 10/02/18  7:25 AM  Result Value Ref Range Status   MRSA by PCR NEGATIVE NEGATIVE Final    Comment:        The GeneXpert MRSA Assay (FDA approved for NASAL  specimens only), is one component of a comprehensive MRSA colonization surveillance program. It is not intended to diagnose MRSA infection nor to guide or monitor treatment for MRSA infections. Performed at [facility], [place] [address], [place], [place] [address]   Aerobic/Anaerobic Culture (surgical/deep wound)     Status: None (Preliminary result)   Collection Time: 10/02/18  2:26 PM  Result Value Ref Range Status   Specimen Description WOUND ABDOMEN  Final   Special Requests Normal  Final   Gram Stain   Final    RARE WBC PRESENT, PREDOMINANTLY MONONUCLEAR NO ORGANISMS SEEN Performed at [facility], [address] [place], [place] [address]    Culture PENDING  Incomplete   Report Status PENDING  Incomplete  Aerobic/Anaerobic Culture (surgical/deep wound)     Status: None (Preliminary result)   Collection Time: 10/02/18  2:26 PM  Result Value Ref Range Status   Specimen Description WOUND ABDOMEN  Final   Special Requests Normal  Final   Gram Stain   Final    FEW WBC PRESENT, PREDOMINANTLY MONONUCLEAR NO ORGANISMS SEEN Performed at [facility], [address], [place], [place] [address]    Culture PENDING  Incomplete   Report Status PENDING  Incomplete    Imaging: Ct Image Guided Fluid Drain By Catheter  Result Date: 10/02/2018 INDICATION: Right lower quadrant superficial and deep fluid collections EXAM: CT GUIDED DRAINAGE OF RIGHT LOWER QUADRANT SUPERFICIAL AND DEEP FLUID COLLECTIONS MEDICATIONS: The patient is currently admitted to the hospital and receiving intravenous antibiotics. The antibiotics were administered within an appropriate time frame prior to the initiation of the procedure. ANESTHESIA/SEDATION: 2.0 mg IV Versed 100 mcg IV Fentanyl Moderate Sedation Time:  13 The patient was continuously monitored during the procedure by the interventional radiology nurse under my direct supervision. COMPLICATIONS: None immediate. TECHNIQUE: Informed written  consent was obtained from the patient after a thorough discussion of the procedural risks, benefits and alternatives. All questions were addressed. Maximal Sterile Barrier Technique was utilized including caps, mask, sterile gowns, sterile gloves, sterile drape, hand hygiene and skin antiseptic. A timeout was performed prior to the initiation of the procedure. PROCEDURE: Previous imaging reviewed. Patient positioned supine. Noncontrast localization CT performed. The right lower quadrant superficial and deep abdominal wall fluid collections were localized. Overlying skin marked for drainage of both collections. Under sterile conditions and local anesthesia, 18 gauge 10 cm access needle were advanced into both the superficial and deep collections. Needle positions confirmed with CT. Syringe aspiration yielded thin dark fluid from both collections suspicious for chronic hematomas. Amplatz guidewire inserted  followed by tract dilatation to insert 2 10 French drains. Both drain catheter positions confirmed with CT. Catheters secured with Prolene sutures and connected to external suction bulb. Patient tolerated the procedure well. No immediate complication. Sterile dressings applied. FINDINGS: Imaging confirms needle placement into the right lower quadrant superficial and deep fluid collections for drain placements IMPRESSION: Successful CT-guided drain placements (2) within the right lower quadrant superficial and deep abdominal wall fluid collections. Electronically Signed   By: Jerilynn Mages.  Shick M.D.   On: 10/02/2018 14:15   Ct Image Guided Fluid Drain By Catheter  Result Date: 10/02/2018 INDICATION: Right lower quadrant superficial and deep fluid collections EXAM: CT GUIDED DRAINAGE OF RIGHT LOWER QUADRANT SUPERFICIAL AND DEEP FLUID COLLECTIONS MEDICATIONS: The patient is currently admitted to the hospital and receiving intravenous antibiotics. The antibiotics were administered within an appropriate time frame prior to the  initiation of the procedure. ANESTHESIA/SEDATION: 2.0 mg IV Versed 100 mcg IV Fentanyl Moderate Sedation Time:  13 The patient was continuously monitored during the procedure by the interventional radiology nurse under my direct supervision. COMPLICATIONS: None immediate. TECHNIQUE: Informed written consent was obtained from the patient after a thorough discussion of the procedural risks, benefits and alternatives. All questions were addressed. Maximal Sterile Barrier Technique was utilized including caps, mask, sterile gowns, sterile gloves, sterile drape, hand hygiene and skin antiseptic. A timeout was performed prior to the initiation of the procedure. PROCEDURE: Previous imaging reviewed. Patient positioned supine. Noncontrast localization CT performed. The right lower quadrant superficial and deep abdominal wall fluid collections were localized. Overlying skin marked for drainage of both collections. Under sterile conditions and local anesthesia, 18 gauge 10 cm access needle were advanced into both the superficial and deep collections. Needle positions confirmed with CT. Syringe aspiration yielded thin dark fluid from both collections suspicious for chronic hematomas. Amplatz guidewire inserted followed by tract dilatation to insert 2 10 French drains. Both drain catheter positions confirmed with CT. Catheters secured with Prolene sutures and connected to external suction bulb. Patient tolerated the procedure well. No immediate complication. Sterile dressings applied. FINDINGS: Imaging confirms needle placement into the right lower quadrant superficial and deep fluid collections for drain placements IMPRESSION: Successful CT-guided drain placements (2) within the right lower quadrant superficial and deep abdominal wall fluid collections. Electronically Signed   By: Jerilynn Mages.  Shick M.D.   On: 10/02/2018 14:15

## 2018-10-03 NOTE — Progress Notes (Addendum)
PROGRESS NOTE    Patrick [name]  XBW:[id number] DOB: 03-May-1956 DOA: 10/01/2018 PCP: Nolene Ebbs, MD    Brief Narrative:  Patrick [name] is a 63 y.o. male with medical history significant of ESRD on TTS HD currently.  Patient had PD catheter placement on 09/13/2018 at [facility].  Had HD yesterday without complications.  Has not yet started PD.  Today he presents to ED with c/o RUQ abd pain, associated with some SOB with exertion.   ED Course: In ED patient noted to have elevated troponin of 0.22, no leukocytosis, no fever, CT reveals mild bibasilar airspace disease, and a large peritoneal fluid collection, seroma vs abscess.  [facility] is unable to admit patient due to no beds available.  Patient requests that he be given a regular diet.  I tried to point out to him that not being on a renal diet could be whats making his BP elevated and I was somewhat suspicious that he may have some fluid retention with this.  He replied to me "no my blood pressure is elevated because I didn't take my meds today".   Assessment & Plan:   Principal Problem:   Peritoneal abscess [facility]) Active Problems:   Essential hypertension   Dyspnea   Chest pain   Elevated troponin   Controlled diabetes mellitus type 2 with complications (HCC)   History of MI (myocardial infarction)   ESRD (end stage renal disease) (HCC)   SOB (shortness of breath)  1 peritoneal abscess versus seroma Patient presented with right-sided abdominal pain.  CT abdomen and pelvis done concerning for abscess formation versus seroma.  Patient has been pancultured.  Patient with recent robotic incarcerated incisional hernia repair with mesh, extensive lysis of adhesions, and peritoneal dialysis catheter placement with Augmentin to pexy right upper quadrant 09/13/2018 per Dr. Raul Del. IR consulted for drain of abscesses and cultures to be sent.  CT-guided drain placement with cultures sent with no organisms to date.  Patient with clinical  improvement.  Discontinue IV vancomycin and IV Rocephin and monitor off antibiotics for the next 24 hours.  Aspirin and Plavix on hold.  General surgery following and appreciate input and recommendations.  IR to advise when aspirin and Plavix may be resumed hopefully in the next 24 hours.  Patient will need to follow-up with his surgeon Dr. Raul Del post discharge.  Discussed antibiotic management with ID.  2.  Elevated troponin/chest pain Patient presented with shortness of breath on minimal exertion as well as complaints of intermittent chest pain on the left described as a dull sensation with some radiation to the left upper extremity.  EKG with T wave inversions in leads I and aVL similar to EKG from 08/19/2018.  Patient however with multiple risk factors of end-stage renal disease on hemodialysis, prior history of coronary artery disease, diabetes mellitus type 2 and as such cardiology was consulted.  Patient with recent 2D echo done.  On 08/20/2018 with normal LV systolic function of 50 to 55%, mild LVH, mild diastolic dysfunction, calcified aortic valve with mild left ear, no wall motion abnormalities.  We will not repeat 2D echo at this time.  Continue Crestor, Imdur.  Aspirin and Plavix on hold.  Patient seen in consultation by cardiology who are recommending continuation of medical therapy with aspirin, Plavix, Norvasc, Zetia, high-dose Imdur and Crestor.  Zebeta added to patient's regimen due to history of intolerance to metoprolol due to headaches.  Per cardiology no further ischemic testing planned at this time.  3.  Diabetes mellitus type 2 Hemoglobin A1c was 6.9 on 08/20/2018.  Patient insistent on being placed on a regular diet.  CBG of 77 this morning.  Continue Prandin.  Follow.  4.  Hyperlipidemia Continue statin.  5.  End-stage renal disease on hemodialysis Nephrology has been notified of patient's admission.  Patient underwent hemodialysis on 10/02/2018.  Per nephrology.   6.  Shortness  of breath Questionable etiology.  Could be secondary to problem #2 versus volume overload secondary to end-stage renal disease.  Bibasilar crackles noted on examination.  Patient improving clinically post hemodialysis.  Nephrology following.   7.  Hypertension Continue current regimen of Imdur, Norvasc.  Patient started on low-dose beta-blocker per cardiology.  Patient underwent hemodialysis on 10/02/2018.    DVT prophylaxis: SCDs Code Status: Full Family Communication: Updated patient.  No family at bedside. Disposition Plan: To be determined.  Likely home when clinically improved.   Consultants:   IR: Dr. Annamaria Boots 10/02/2018  General surgery: Dr. Romana Juniper 10/01/2018  Nephrology: Dr.Schertz 10/02/2018  Cardiology: Dr. Domenic Polite 10/02/2018  Procedures:   CT abdomen and pelvis 10/01/2018  Chest x-ray 10/01/2018  CT-guided drainage of right lower quadrant superficial and deep fluid collection a cement of 2 drains in the right lower quadrant per Dr.Shick 10/02/2018  Antimicrobials:   IV vancomycin 10/02/2018>>>>>> 10/03/2018  IV Rocephin 10/02/2018>>>>>> 10/03/2018   Subjective: Patient denies any ongoing chest pain.  States shortness of breath improving.  Still with some abdominal pain however improving.  Objective: Vitals:   10/02/18 1800 10/02/18 1807 10/02/18 2300 10/03/18 0907  BP: (!) 153/66 (!) 159/76 (!) 154/75 133/67  Pulse: 85 88 83 79  Resp: (!) 24 (!) 25 18 18   Temp:  97.7 F (36.5 C) 97.9 F (36.6 C) 98.7 F (37.1 C)  TempSrc:  Oral Oral Oral  SpO2:  98% 94% 97%  Weight:  98.3 kg    Height:        Intake/Output Summary (Last 24 hours) at 10/03/2018 1041 Last data filed at 10/03/2018 0600 Gross per 24 hour  Intake 350 ml  Output 4225 ml  Net -3875 ml   Filed Weights   10/02/18 0100 10/02/18 1352 10/02/18 1807  Weight: 101.1 kg 102.9 kg 98.3 kg    Examination:  General exam: Appears calm and comfortable  Respiratory system: Some bibasilar crackles.  No rhonchi.   No wheezing.  Normal respiratory effort.  Speaking in full sentences.  Respiratory effort normal. Cardiovascular system: Regular rate and rhythm no murmurs rubs or gallops.  No JVD.  No lower extremity edema.  Gastrointestinal system: Abdomen is nondistended, soft and tender to palpation right mid abdomen, positive bowel sounds, no rebound, no guarding.  Palpable subcutaneous mass in the region around hernia repair site right lower quadrant.  Abdominal binder on.  JP tubes in place with brown drainage noted. Central nervous system: Alert and oriented. No focal neurological deficits. Extremities: Symmetric 5 x 5 power. Skin: No rashes, lesions or ulcers Psychiatry: Judgement and insight appear normal. Mood & affect appropriate.     Data Reviewed: I have personally reviewed following labs and imaging studies  CBC: Recent Labs  Lab 10/01/18 1927 10/02/18 0702 10/03/18 0351  WBC 6.9 5.8 5.4  5.2  NEUTROABS  --   --  2.8  HGB 9.0* 7.7* 9.4*  9.4*  HCT 31.1* 25.4* 30.2*  31.1*  MCV 91.5 89.4 88.3  88.4  PLT 195 175 175  947   Basic Metabolic Panel: Recent Labs  Lab 10/01/18  1927 10/02/18 0702 10/03/18 0351  NA 139 139 136  134*  K 3.4* 3.8 3.6  3.6  CL 103 103 100  99  CO2 23 23 24  25   GLUCOSE 101* 87 127*  125*  BUN 29* 33* 19  19  CREATININE 10.09* 11.13* 7.37*  7.25*  CALCIUM 9.7 8.9 9.1  9.1  PHOS  --   --  3.7  3.7   GFR: Estimated Creatinine Clearance: 11.2 mL/min (A) (by C-G formula based on SCr of 7.37 mg/dL (H)). Liver Function Tests: Recent Labs  Lab 10/01/18 1927 10/03/18 0351  AST 20  --   ALT 11  --   ALKPHOS 70  --   BILITOT 0.7  --   PROT 7.1  --   ALBUMIN 3.3* 2.9*  2.9*   Recent Labs  Lab 10/01/18 1927  LIPASE 41   No results for input(s): AMMONIA in the last 168 hours. Coagulation Profile: No results for input(s): INR, PROTIME in the last 168 hours. Cardiac Enzymes: Recent Labs  Lab 10/02/18 0039 10/02/18 0702  10/03/18 0351  TROPONINI 0.32* 0.43* 0.29*   BNP (last 3 results) No results for input(s): PROBNP in the last 8760 hours. HbA1C: No results for input(s): HGBA1C in the last 72 hours. CBG: Recent Labs  Lab 10/02/18 0834 10/02/18 1853 10/02/18 2049  GLUCAP 77 62* 96   Lipid Profile: No results for input(s): CHOL, HDL, LDLCALC, TRIG, CHOLHDL, LDLDIRECT in the last 72 hours. Thyroid Function Tests: No results for input(s): TSH, T4TOTAL, FREET4, T3FREE, THYROIDAB in the last 72 hours. Anemia Panel: Recent Labs    10/02/18 0851 10/03/18 0351  VITAMINB12  --  330  FOLATE 9.5  --   FERRITIN 1,319*  --   TIBC 148*  --   IRON 24*  --    Sepsis Labs: No results for input(s): PROCALCITON, LATICACIDVEN in the last 168 hours.  Recent Results (from the past 240 hour(s))  Culture, blood (routine x 2)     Status: None (Preliminary result)   Collection Time: 10/02/18 12:25 AM  Result Value Ref Range Status   Specimen Description BLOOD LEFT ARM  Final   Special Requests   Final    BOTTLES DRAWN AEROBIC AND ANAEROBIC Blood Culture adequate volume   Culture   Final    NO GROWTH 1 DAY Performed at [facility], [address], [place], [place] [address]    Report Status PENDING  Incomplete  Culture, blood (routine x 2)     Status: None (Preliminary result)   Collection Time: 10/02/18 12:40 AM  Result Value Ref Range Status   Specimen Description BLOOD LEFT HAND  Final   Special Requests   Final    BOTTLES DRAWN AEROBIC ONLY Blood Culture results may not be optimal due to an excessive volume of blood received in culture bottles   Culture   Final    NO GROWTH 1 DAY Performed at [facility], [place] [address] [place],  [address]    Report Status PENDING  Incomplete  MRSA PCR Screening     Status: None   Collection Time: 10/02/18  7:25 AM  Result Value Ref Range Status   MRSA by PCR NEGATIVE NEGATIVE Final    Comment:        The GeneXpert MRSA Assay  (FDA approved for NASAL specimens only), is one component of a comprehensive MRSA colonization surveillance program. It is not intended to diagnose MRSA infection nor to  guide or monitor treatment for MRSA infections. Performed at [facility], [place] [address], [place], [place] [address]   Aerobic/Anaerobic Culture (surgical/deep wound)     Status: None (Preliminary result)   Collection Time: 10/02/18  2:26 PM  Result Value Ref Range Status   Specimen Description WOUND ABDOMEN  Final   Special Requests Normal  Final   Gram Stain   Final    RARE WBC PRESENT, PREDOMINANTLY MONONUCLEAR NO ORGANISMS SEEN Performed at [facility], [address] [place], [place] [address]    Culture PENDING  Incomplete   Report Status PENDING  Incomplete  Aerobic/Anaerobic Culture (surgical/deep wound)     Status: None (Preliminary result)   Collection Time: 10/02/18  2:26 PM  Result Value Ref Range Status   Specimen Description WOUND ABDOMEN  Final   Special Requests Normal  Final   Gram Stain   Final    FEW WBC PRESENT, PREDOMINANTLY MONONUCLEAR NO ORGANISMS SEEN Performed at [facility], [address], [place], [place] [address]    Culture PENDING  Incomplete   Report Status PENDING  Incomplete         Radiology Studies: Ct Abdomen Pelvis Wo Contrast  Result Date: 10/01/2018 CLINICAL DATA:  Shortness of breath.  Abdominal distention. EXAM: CT ABDOMEN AND PELVIS WITHOUT CONTRAST TECHNIQUE: Multidetector CT imaging of the abdomen and pelvis was performed following the standard protocol without IV contrast. COMPARISON:  None. FINDINGS: Lower chest: Heart is enlarged. Coronary artery calcifications are present. Mitral valve annular calcifications are present. Atherosclerotic changes are noted in the descending aorta. Mild bibasilar airspace disease is present. No significant pleural or pericardial effusion is present. Hepatobiliary: No focal liver abnormality is seen. No  gallstones, gallbladder wall thickening, or biliary dilatation. Pancreas: Unremarkable. No pancreatic ductal dilatation or surrounding inflammatory changes. Spleen: Normal in size without focal abnormality. Adrenals/Urinary Tract: Adrenal glands are normal bilaterally. 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 Ureters are within normal limits. The urinary bladder is collapsed. Stomach/Bowel: A stomach and duodenum are within normal limits. Proximal small bowel is unremarkable. There is focal inflammation of bowel loops of small bowel adjacent to a fluid collection along the anterior peritoneum on the right. The right anterior peritoneal fluid collection measures 12.9 x 6.5 x 14.6 cm. There is no obstruction. Terminal ileum is otherwise normal. The appendix is visualized and normal. The ascending and transverse colon is mostly collapsed. Diverticular changes are present in the descending and sigmoid colon without focal inflammation. Vascular/Lymphatic: Extensive vascular calcifications are present. There is no aneurysm. No significant adenopathy is present. Reproductive: Prostate is unremarkable. Other: Peritoneal dialysis catheter is in place. The anterior wall fluid collection seen on the previous study is stable. This is just anterior to the new collection. Elder collection measures 8.6 x 5.5 x 7.8 cm. There is some inflammatory change the subcutaneous tissues inferiorly. Musculoskeletal: Vertebral body heights and alignment are maintained. No focal lytic or blastic lesions are present. IMPRESSION: 1. New ventral peritoneal fluid collection measures 12.9 x 6.5 x 14.6 cm. This is worrisome for a loculated fluid collection associated with dialysis or developing abscess related to bowel disease. It appears separate from the other anterior abdominal wall fluid collection. 2. The more remote anterior wall fluid collection is stable. 3. Inflammatory changes of bowel adjacent to the fluid collection, likely reflecting  adhesions. There is no obstruction. This is in the region of the previous hernia. 4. Dialysis catheter. 5. Aortic Atherosclerosis (ICD10-I70.0). Extensive  vascular calcifications are present without aneurysm. 6. Cystic renal disease of dialysis. 7. Cardiomegaly without failure. 8. Coronary artery disease. Electronically Signed   By: San Morelle M.D.   On: 10/01/2018 21:40   Dg Chest Port 1 View  Result Date: 10/01/2018 CLINICAL DATA:  Dyspnea for the past 2 hours. EXAM: PORTABLE CHEST 1 VIEW COMPARISON:  08/19/2018 FINDINGS: Stable cardiomegaly with aortic atherosclerosis. Dual lumen dialysis catheter terminates in the right atrium unchanged in position. Median sternotomy sutures and post surgical clips project over the cardiac silhouette. No overt pulmonary edema. Hazy appearance at the left lung base may represent a small layering left effusion. No acute osseous abnormality. Vascular clips project over the superior mediastinum as well as a vascular stent projecting over the right upper thorax in the region of the subclavian artery. IMPRESSION: Stable cardiomegaly. Hazy opacity at the left lung base may represent a small layering left effusion, new since prior. No overt pulmonary edema. No other change of note. Electronically Signed   By: Ashley Royalty M.D.   On: 10/01/2018 20:56   Ct Image Guided Fluid Drain By Catheter  Result Date: 10/02/2018 INDICATION: Right lower quadrant superficial and deep fluid collections EXAM: CT GUIDED DRAINAGE OF RIGHT LOWER QUADRANT SUPERFICIAL AND DEEP FLUID COLLECTIONS MEDICATIONS: The patient is currently admitted to the hospital and receiving intravenous antibiotics. The antibiotics were administered within an appropriate time frame prior to the initiation of the procedure. ANESTHESIA/SEDATION: 2.0 mg IV Versed 100 mcg IV Fentanyl Moderate Sedation Time:  13 The patient was continuously monitored during the procedure by the interventional radiology nurse under my  direct supervision. COMPLICATIONS: None immediate. TECHNIQUE: Informed written consent was obtained from the patient after a thorough discussion of the procedural risks, benefits and alternatives. All questions were addressed. Maximal Sterile Barrier Technique was utilized including caps, mask, sterile gowns, sterile gloves, sterile drape, hand hygiene and skin antiseptic. A timeout was performed prior to the initiation of the procedure. PROCEDURE: Previous imaging reviewed. Patient positioned supine. Noncontrast localization CT performed. The right lower quadrant superficial and deep abdominal wall fluid collections were localized. Overlying skin marked for drainage of both collections. Under sterile conditions and local anesthesia, 18 gauge 10 cm access needle were advanced into both the superficial and deep collections. Needle positions confirmed with CT. Syringe aspiration yielded thin dark fluid from both collections suspicious for chronic hematomas. Amplatz guidewire inserted followed by tract dilatation to insert 2 10 French drains. Both drain catheter positions confirmed with CT. Catheters secured with Prolene sutures and connected to external suction bulb. Patient tolerated the procedure well. No immediate complication. Sterile dressings applied. FINDINGS: Imaging confirms needle placement into the right lower quadrant superficial and deep fluid collections for drain placements IMPRESSION: Successful CT-guided drain placements (2) within the right lower quadrant superficial and deep abdominal wall fluid collections. Electronically Signed   By: Jerilynn Mages.  Shick M.D.   On: 10/02/2018 14:15   Ct Image Guided Fluid Drain By Catheter  Result Date: 10/02/2018 INDICATION: Right lower quadrant superficial and deep fluid collections EXAM: CT GUIDED DRAINAGE OF RIGHT LOWER QUADRANT SUPERFICIAL AND DEEP FLUID COLLECTIONS MEDICATIONS: The patient is currently admitted to the hospital and receiving intravenous antibiotics.  The antibiotics were administered within an appropriate time frame prior to the initiation of the procedure. ANESTHESIA/SEDATION: 2.0 mg IV Versed 100 mcg IV Fentanyl Moderate Sedation Time:  13 The patient was continuously monitored during the procedure by the interventional radiology nurse under my direct supervision. COMPLICATIONS: None immediate. TECHNIQUE:  Informed written consent was obtained from the patient after a thorough discussion of the procedural risks, benefits and alternatives. All questions were addressed. Maximal Sterile Barrier Technique was utilized including caps, mask, sterile gowns, sterile gloves, sterile drape, hand hygiene and skin antiseptic. A timeout was performed prior to the initiation of the procedure. PROCEDURE: Previous imaging reviewed. Patient positioned supine. Noncontrast localization CT performed. The right lower quadrant superficial and deep abdominal wall fluid collections were localized. Overlying skin marked for drainage of both collections. Under sterile conditions and local anesthesia, 18 gauge 10 cm access needle were advanced into both the superficial and deep collections. Needle positions confirmed with CT. Syringe aspiration yielded thin dark fluid from both collections suspicious for chronic hematomas. Amplatz guidewire inserted followed by tract dilatation to insert 2 10 French drains. Both drain catheter positions confirmed with CT. Catheters secured with Prolene sutures and connected to external suction bulb. Patient tolerated the procedure well. No immediate complication. Sterile dressings applied. FINDINGS: Imaging confirms needle placement into the right lower quadrant superficial and deep fluid collections for drain placements IMPRESSION: Successful CT-guided drain placements (2) within the right lower quadrant superficial and deep abdominal wall fluid collections. Electronically Signed   By: Jerilynn Mages.  Shick M.D.   On: 10/02/2018 14:15        Scheduled  Meds: . sodium chloride   Intravenous Once  . acetaminophen  650 mg Oral Once  . amLODipine  5 mg Oral Daily  . bisoprolol  5 mg Oral Daily  . calcium acetate  2,001 mg Oral Q supper  . Chlorhexidine Gluconate Cloth  6 each Topical Q0600  . diphenhydrAMINE  25 mg Oral Once  . ezetimibe  10 mg Oral Daily  . feeding supplement (NEPRO CARB STEADY)  237 mL Oral Q1500  . isosorbide mononitrate  120 mg Oral Daily  . multivitamin  1 tablet Oral Daily  . pneumococcal 23 valent vaccine  0.5 mL Intramuscular Tomorrow-1000  . repaglinide  0.5 mg Oral BID AC  . rosuvastatin  10 mg Oral Daily  . sodium chloride flush  3 mL Intravenous Once  . sodium chloride flush  5 mL Intracatheter Q8H   Continuous Infusions: . sodium chloride    . sodium chloride    . cefTRIAXone (ROCEPHIN)  IV 2 g (10/03/18 0023)  . vancomycin       LOS: 0 days    Time spent: 35 minutes    Irine Seal, MD [facility]  If 7PM-7AM, please contact night-coverage [email or web address] 10/03/2018, 10:41 AM

## 2018-10-03 NOTE — Progress Notes (Signed)
Pt refused dressing change covering the JP drains. Surgical dressing c/d/i.

## 2018-10-03 NOTE — Progress Notes (Signed)
Referring Physician(s): Dr. Jennette Kettle  Supervising Physician: Daryll Brod  Patient Status:  [facility] - In-pt  Chief Complaint: Follow up abdominal drain placement x2 on 3/7 by Dr. Annamaria Boots.  Subjective:  Patient lying in bed talking on the phone with his wife, they are wondering how long the cultures will take and when he will be able to go home. He denies any complaints today except minimal soreness at drain insertion sites.   Allergies: Iodinated diagnostic agents; Lipitor [atorvastatin]; Metoprolol; and Baclofen  Medications: Prior to Admission medications   Medication Sig Start Date End Date Taking? Authorizing Provider  acetaminophen (TYLENOL) 500 MG tablet Take 1,000 mg by mouth every 6 (six) hours as needed for moderate pain or headache.   Yes [provider]  albuterol (PROVENTIL HFA;VENTOLIN HFA) 108 (90 Base) MCG/ACT inhaler Inhale 1-2 puffs into the lungs every 6 (six) hours as needed for wheezing or shortness of breath. 12/18/17  Yes Weaver, Scott T, PA-C  amLODipine (NORVASC) 5 MG tablet Take 1 tablet (5 mg total) by mouth daily. 11/27/17 08/19/26 Yes Richardson Dopp T, PA-C  aspirin EC 81 MG tablet Take 81 mg by mouth daily.   Yes [provider]  calcium acetate (PHOSLO) 667 MG capsule Take 2,001 mg by mouth daily with supper.    Yes [provider]  clopidogrel (PLAVIX) 75 MG tablet Take 1 tablet (75 mg total) by mouth daily. 12/18/17  Yes Weaver, Scott T, PA-C  ezetimibe (ZETIA) 10 MG tablet Take 1 tablet (10 mg total) by mouth daily. 04/02/18 08/19/26 Yes Weaver, Scott T, PA-C  gentamicin cream (GARAMYCIN) 0.1 % Apply 1 application topically daily as needed (prior to accessing PD catheter).  09/28/18  Yes [provider]  HYDROcodone-acetaminophen (NORCO/VICODIN) 5-325 MG tablet Take 1 tablet by mouth every 6 (six) hours as needed for moderate pain.   Yes [provider]  isosorbide mononitrate (IMDUR) 60 MG 24 hr tablet Take 2 tablets  (120 mg total) by mouth daily. 08/21/18  Yes Hongalgi, Lenis Dickinson, MD  multivitamin (RENA-VIT) TABS tablet Take 1 tablet by mouth daily.   Yes [provider]  repaglinide (PRANDIN) 0.5 MG tablet Take 1 tablet (0.5 mg total) by mouth 2 (two) times daily before a meal. 04/05/18  Yes Renato Shin, MD  rosuvastatin (CRESTOR) 10 MG tablet Take 1 tablet (10 mg total) by mouth daily at 6 PM. Patient taking differently: Take 10 mg by mouth daily.  08/21/18  Yes Modena Jansky, MD     Vital Signs: BP 133/67   Pulse 79   Temp 98.7 F (37.1 C) (Oral)   Resp 18   Ht 5' 6.5" (1.689 m)   Wt 216 lb 11.4 oz (98.3 kg)   SpO2 97%   BMI 34.45 kg/m   Physical Exam Vitals signs and nursing note reviewed.  Constitutional:      General: He is not in acute distress.    Appearance: He is obese.  HENT:     Head: Normocephalic.  Cardiovascular:     Rate and Rhythm: Normal rate.  Pulmonary:     Effort: Pulmonary effort is normal.  Abdominal:     General: There is no distension.     Palpations: Abdomen is soft.     Tenderness: There is abdominal tenderness (Minimal at abd drain insertion sites).     Comments: Abdominal binder in place. (+) right sided abdominal drains x2 both to JP, both with dark reddish brown output in both  bulbs. Insertion sites clean, dry, dressed appropriately without erythema, edema, drainage or active bleeding.    Skin:    General: Skin is warm and dry.  Neurological:     Mental Status: He is alert. Mental status is at baseline.     Imaging: Ct Abdomen Pelvis Wo Contrast  Result Date: 10/01/2018 CLINICAL DATA:  Shortness of breath.  Abdominal distention. EXAM: CT ABDOMEN AND PELVIS WITHOUT CONTRAST TECHNIQUE: Multidetector CT imaging of the abdomen and pelvis was performed following the standard protocol without IV contrast. COMPARISON:  None. FINDINGS: Lower chest: Heart is enlarged. Coronary artery calcifications are present. Mitral valve annular calcifications are  present. Atherosclerotic changes are noted in the descending aorta. Mild bibasilar airspace disease is present. No significant pleural or pericardial effusion is present. Hepatobiliary: No focal liver abnormality is seen. No gallstones, gallbladder wall thickening, or biliary dilatation. Pancreas: Unremarkable. No pancreatic ductal dilatation or surrounding inflammatory changes. Spleen: Normal in size without focal abnormality. Adrenals/Urinary Tract: Adrenal glands are normal bilaterally. 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 Ureters are within normal limits. The urinary bladder is collapsed. Stomach/Bowel: A stomach and duodenum are within normal limits. Proximal small bowel is unremarkable. There is focal inflammation of bowel loops of small bowel adjacent to a fluid collection along the anterior peritoneum on the right. The right anterior peritoneal fluid collection measures 12.9 x 6.5 x 14.6 cm. There is no obstruction. Terminal ileum is otherwise normal. The appendix is visualized and normal. The ascending and transverse colon is mostly collapsed. Diverticular changes are present in the descending and sigmoid colon without focal inflammation. Vascular/Lymphatic: Extensive vascular calcifications are present. There is no aneurysm. No significant adenopathy is present. Reproductive: Prostate is unremarkable. Other: Peritoneal dialysis catheter is in place. The anterior wall fluid collection seen on the previous study is stable. This is just anterior to the new collection. Elder collection measures 8.6 x 5.5 x 7.8 cm. There is some inflammatory change the subcutaneous tissues inferiorly. Musculoskeletal: Vertebral body heights and alignment are maintained. No focal lytic or blastic lesions are present. IMPRESSION: 1. New ventral peritoneal fluid collection measures 12.9 x 6.5 x 14.6 cm. This is worrisome for a loculated fluid collection associated with dialysis or developing abscess related to bowel  disease. It appears separate from the other anterior abdominal wall fluid collection. 2. The more remote anterior wall fluid collection is stable. 3. Inflammatory changes of bowel adjacent to the fluid collection, likely reflecting adhesions. There is no obstruction. This is in the region of the previous hernia. 4. Dialysis catheter. 5. Aortic Atherosclerosis (ICD10-I70.0). Extensive vascular calcifications are present without aneurysm. 6. Cystic renal disease of dialysis. 7. Cardiomegaly without failure. 8. Coronary artery disease. Electronically Signed   By: San Morelle M.D.   On: 10/01/2018 21:40   Dg Chest Port 1 View  Result Date: 10/01/2018 CLINICAL DATA:  Dyspnea for the past 2 hours. EXAM: PORTABLE CHEST 1 VIEW COMPARISON:  08/19/2018 FINDINGS: Stable cardiomegaly with aortic atherosclerosis. Dual lumen dialysis catheter terminates in the right atrium unchanged in position. Median sternotomy sutures and post surgical clips project over the cardiac silhouette. No overt pulmonary edema. Hazy appearance at the left lung base may represent a small layering left effusion. No acute osseous abnormality. Vascular clips project over the superior mediastinum as well as a vascular stent projecting over the right upper thorax in the region of the subclavian artery. IMPRESSION: Stable cardiomegaly. Hazy opacity at the left lung base may represent a small layering left effusion,  new since prior. No overt pulmonary edema. No other change of note. Electronically Signed   By: Ashley Royalty M.D.   On: 10/01/2018 20:56   Ct Image Guided Fluid Drain By Catheter  Result Date: 10/02/2018 INDICATION: Right lower quadrant superficial and deep fluid collections EXAM: CT GUIDED DRAINAGE OF RIGHT LOWER QUADRANT SUPERFICIAL AND DEEP FLUID COLLECTIONS MEDICATIONS: The patient is currently admitted to the hospital and receiving intravenous antibiotics. The antibiotics were administered within an appropriate time frame prior  to the initiation of the procedure. ANESTHESIA/SEDATION: 2.0 mg IV Versed 100 mcg IV Fentanyl Moderate Sedation Time:  13 The patient was continuously monitored during the procedure by the interventional radiology nurse under my direct supervision. COMPLICATIONS: None immediate. TECHNIQUE: Informed written consent was obtained from the patient after a thorough discussion of the procedural risks, benefits and alternatives. All questions were addressed. Maximal Sterile Barrier Technique was utilized including caps, mask, sterile gowns, sterile gloves, sterile drape, hand hygiene and skin antiseptic. A timeout was performed prior to the initiation of the procedure. PROCEDURE: Previous imaging reviewed. Patient positioned supine. Noncontrast localization CT performed. The right lower quadrant superficial and deep abdominal wall fluid collections were localized. Overlying skin marked for drainage of both collections. Under sterile conditions and local anesthesia, 18 gauge 10 cm access needle were advanced into both the superficial and deep collections. Needle positions confirmed with CT. Syringe aspiration yielded thin dark fluid from both collections suspicious for chronic hematomas. Amplatz guidewire inserted followed by tract dilatation to insert 2 10 French drains. Both drain catheter positions confirmed with CT. Catheters secured with Prolene sutures and connected to external suction bulb. Patient tolerated the procedure well. No immediate complication. Sterile dressings applied. FINDINGS: Imaging confirms needle placement into the right lower quadrant superficial and deep fluid collections for drain placements IMPRESSION: Successful CT-guided drain placements (2) within the right lower quadrant superficial and deep abdominal wall fluid collections. Electronically Signed   By: Jerilynn Mages.  Shick M.D.   On: 10/02/2018 14:15   Ct Image Guided Fluid Drain By Catheter  Result Date: 10/02/2018 INDICATION: Right lower quadrant  superficial and deep fluid collections EXAM: CT GUIDED DRAINAGE OF RIGHT LOWER QUADRANT SUPERFICIAL AND DEEP FLUID COLLECTIONS MEDICATIONS: The patient is currently admitted to the hospital and receiving intravenous antibiotics. The antibiotics were administered within an appropriate time frame prior to the initiation of the procedure. ANESTHESIA/SEDATION: 2.0 mg IV Versed 100 mcg IV Fentanyl Moderate Sedation Time:  13 The patient was continuously monitored during the procedure by the interventional radiology nurse under my direct supervision. COMPLICATIONS: None immediate. TECHNIQUE: Informed written consent was obtained from the patient after a thorough discussion of the procedural risks, benefits and alternatives. All questions were addressed. Maximal Sterile Barrier Technique was utilized including caps, mask, sterile gowns, sterile gloves, sterile drape, hand hygiene and skin antiseptic. A timeout was performed prior to the initiation of the procedure. PROCEDURE: Previous imaging reviewed. Patient positioned supine. Noncontrast localization CT performed. The right lower quadrant superficial and deep abdominal wall fluid collections were localized. Overlying skin marked for drainage of both collections. Under sterile conditions and local anesthesia, 18 gauge 10 cm access needle were advanced into both the superficial and deep collections. Needle positions confirmed with CT. Syringe aspiration yielded thin dark fluid from both collections suspicious for chronic hematomas. Amplatz guidewire inserted followed by tract dilatation to insert 2 10 French drains. Both drain catheter positions confirmed with CT. Catheters secured with Prolene sutures and connected to external suction bulb. Patient  tolerated the procedure well. No immediate complication. Sterile dressings applied. FINDINGS: Imaging confirms needle placement into the right lower quadrant superficial and deep fluid collections for drain placements  IMPRESSION: Successful CT-guided drain placements (2) within the right lower quadrant superficial and deep abdominal wall fluid collections. Electronically Signed   By: Jerilynn Mages.  Shick M.D.   On: 10/02/2018 14:15    Labs:  CBC: Recent Labs    08/21/18 0738 10/01/18 1927 10/02/18 0702 10/03/18 0351  WBC 6.3 6.9 5.8 5.4  5.2  HGB 11.0* 9.0* 7.7* 9.4*  9.4*  HCT 37.2* 31.1* 25.4* 30.2*  31.1*  PLT 156 195 175 175  182    COAGS: Recent Labs    08/19/18 1716  INR 1.06  APTT 34    BMP: Recent Labs    08/21/18 0739 10/01/18 1927 10/02/18 0702 10/03/18 0351  NA 135 139 139 136  134*  K 5.2* 3.4* 3.8 3.6  3.6  CL 94* 103 103 100  99  CO2 24 23 23 24  25   GLUCOSE 118* 101* 87 127*  125*  BUN 47* 29* 33* 19  19  CALCIUM 9.3 9.7 8.9 9.1  9.1  CREATININE 14.80* 10.09* 11.13* 7.37*  7.25*  GFRNONAA 3* 5* 4* 7*  7*  GFRAA 4* 6* 5* 8*  8*    LIVER FUNCTION TESTS: Recent Labs    05/11/18 1735  07/02/18 0735 08/19/18 1716 08/21/18 0739 10/01/18 1927 10/03/18 0351  BILITOT 1.0  --  0.3 0.5  --  0.7  --   AST 34  --  14 19  --  20  --   ALT 12  --  8 10  --  11  --   ALKPHOS 53  --  173* 63  --  70  --   PROT 7.3  --  7.3 8.6*  --  7.1  --   ALBUMIN 3.4*   < > 4.5 4.1 3.5 3.3* 2.9*  2.9*   < > = values in this interval not displayed.    Assessment and Plan:  63 y/o M s/p abdominal drain placement x2 in IR on 3/7 for superficial and deep abdominal wall fluid collections - fluid consistent with old hematomas, cxs pending. Total OP from both drains 325 cc since placement. Patient feeling good overall. Per chart plan to return to [facility] for further care with Dr. Raul Del when a bed is available.   Continue TID flushes with 3-5 cc NS, record drain output daily. IR will continue to follow.  Please call with questions or concerns.   Electronically Signed: Joaquim Nam, PA-C 10/03/2018, 10:29 AM   I spent a total of 15 Minutes at the the  patient's bedside AND on the patient's hospital floor or unit, greater than 50% of which was counseling/coordinating care for abdominal drain x2 follow up.

## 2018-10-03 NOTE — Progress Notes (Signed)
[facility] Progress Note  Subjective: feeling better.  Got HD yest w/ 2.5 L off. Also had drains x 2 placed by IR yest into the two fluid collections. GS on both was unrevealing (no org seen, few wbc's.).   Vitals:   10/02/18 1800 10/02/18 1807 10/02/18 2300 10/03/18 0907  BP: (!) 153/66 (!) 159/76 (!) 154/75 133/67  Pulse: 85 88 83 79  Resp: (!) 24 (!) 25 18 18   Temp:  97.7 F (36.5 C) 97.9 F (36.6 C) 98.7 F (37.1 C)  TempSrc:  Oral Oral Oral  SpO2:  98% 94% 97%  Weight:  98.3 kg    Height:        Inpatient medications: . sodium chloride   Intravenous Once  . acetaminophen  650 mg Oral Once  . amLODipine  5 mg Oral Daily  . bisoprolol  5 mg Oral Daily  . calcium acetate  2,001 mg Oral Q supper  . Chlorhexidine Gluconate Cloth  6 each Topical Q0600  . diphenhydrAMINE  25 mg Oral Once  . ezetimibe  10 mg Oral Daily  . feeding supplement (NEPRO CARB STEADY)  237 mL Oral Q1500  . isosorbide mononitrate  120 mg Oral Daily  . multivitamin  1 tablet Oral Daily  . pneumococcal 23 valent vaccine  0.5 mL Intramuscular Tomorrow-1000  . repaglinide  0.5 mg Oral BID AC  . rosuvastatin  10 mg Oral Daily  . sodium chloride flush  3 mL Intravenous Once  . sodium chloride flush  5 mL Intracatheter Q8H   . sodium chloride    . sodium chloride    . cefTRIAXone (ROCEPHIN)  IV 2 g (10/03/18 0023)  . vancomycin     sodium chloride, sodium chloride, acetaminophen, albuterol, alteplase, heparin, heparin, heparin, lidocaine (PF), lidocaine-prilocaine, ondansetron **OR** ondansetron (ZOFRAN) IV, pentafluoroprop-tetrafluoroeth, senna-docusate  Iron/TIBC/Ferritin/ %Sat    Component Value Date/Time   IRON 24 (L) 10/02/2018 0851   TIBC 148 (L) 10/02/2018 0851   FERRITIN 1,319 (H) 10/02/2018 0851   IRONPCTSAT 16 (L) 10/02/2018 7867    Exam: Gen alert, no distress, looks much better Chest clear bilat today RRR no MRG Abd soft ntnd no mass or ascites +bs, obese, drains x 2  in R lower abd, PD cath lower abd w/ dressing Ext no LE edema Neuro is alert, Ox 3 , nf L IJ TDC     Home meds:  - amlodipine 5/ bisoprolol  - aspirin 81/ clopidogrel 75 qd/ ezetimibe 10 / isosorbide mono 120 qd/ rosuvastatin 10 qd  - repaglinide 0.5 bid  - prn hydrocodone-aceta qid/ calc acetate 3 ac/ prn albut nebs     [facility]  4h  2K/3.5 bath  400/800  101.5kg   TDC [facility]   Hep 3000    Assessment: 1. Abd pain/ fluid collections x2 by CT in region of recently placed PD cath -  ?infection vs seroma, sp drain placement x 2.  Cx's pending. Abd pain better.  2. ESRD - on HD TTS. SP recent PD cath 09/13/18 Dr Raul Del along w/ hernia repair w/ mesh.  Got HD here yest , under dry wt now and will need lower dry wt at dc  3. SOB/ vol overload - losing body wt, as above. Better after HD yest.   4. Hx failed renal Tx  5. HTN on norvasc/ bisoprolol, BP's good 6. CAD hx MI 7. DM2 not on insulin 8. Anemia ckd - Hb ok, up after vol removal and got  1u prbc yest.       Kelly Splinter [facility] 10/03/2018, 12:53 PM  Recent Labs  Lab 10/01/18 1927 10/02/18 0702 10/03/18 0351  NA 139 139 136  134*  K 3.4* 3.8 3.6  3.6  CL 103 103 100  99  CO2 23 23 24  25   GLUCOSE 101* 87 127*  125*  BUN 29* 33* 19  19  CREATININE 10.09* 11.13* 7.37*  7.25*  CALCIUM 9.7 8.9 9.1  9.1  PHOS  --   --  3.7  3.7  ALBUMIN 3.3*  --  2.9*  2.9*   Recent Labs  Lab 10/01/18 1927  AST 20  ALT 11  ALKPHOS 70  BILITOT 0.7  PROT 7.1   Recent Labs  Lab 10/02/18 0702 10/03/18 0351  WBC 5.8 5.4  5.2  NEUTROABS  --  2.8  HGB 7.7* 9.4*  9.4*  HCT 25.4* 30.2*  31.1*  MCV 89.4 88.3  88.4  PLT 175 175  182

## 2018-10-04 ENCOUNTER — Ambulatory Visit: Payer: Medicare Other | Admitting: Endocrinology

## 2018-10-04 DIAGNOSIS — K651 Peritoneal abscess: Secondary | ICD-10-CM

## 2018-10-04 LAB — RENAL FUNCTION PANEL
Albumin: 2.9 g/dL — ABNORMAL LOW (ref 3.5–5.0)
Albumin: 2.9 g/dL — ABNORMAL LOW (ref 3.5–5.0)
Albumin: 2.9 g/dL — ABNORMAL LOW (ref 3.5–5.0)
Anion gap: 10 (ref 5–15)
Anion gap: 12 (ref 5–15)
Anion gap: 11 (ref 5–15)
BUN: 19 mg/dL (ref 8–23)
BUN: 19 mg/dL (ref 8–23)
BUN: 35 mg/dL — ABNORMAL HIGH (ref 8–23)
CO2: 24 mmol/L (ref 22–32)
CO2: 25 mmol/L (ref 22–32)
CO2: 26 mmol/L (ref 22–32)
Calcium: 9.1 mg/dL (ref 8.9–10.3)
Calcium: 9.1 mg/dL (ref 8.9–10.3)
Calcium: 8.6 mg/dL — ABNORMAL LOW (ref 8.9–10.3)
Chloride: 100 mmol/L (ref 98–111)
Chloride: 99 mmol/L (ref 98–111)
Chloride: 100 mmol/L (ref 98–111)
Creatinine, Ser: 7.25 mg/dL — ABNORMAL HIGH (ref 0.61–1.24)
Creatinine, Ser: 7.37 mg/dL — ABNORMAL HIGH (ref 0.61–1.24)
Creatinine, Ser: 10.69 mg/dL — ABNORMAL HIGH (ref 0.61–1.24)
GFR calc Af Amer: 8 mL/min — ABNORMAL LOW (ref >60)
GFR calc Af Amer: 5 mL/min — ABNORMAL LOW (ref >60)
GFR calc non Af Amer: 7 mL/min — ABNORMAL LOW (ref >60)
GFR calc non Af Amer: 5 mL/min — ABNORMAL LOW (ref >60)
GFR, EST AFRICAN AMERICAN: 8 mL/min — AB (ref >60)
GFR, EST NON AFRICAN AMERICAN: 7 mL/min — AB (ref >60)
Glucose, Bld: 125 mg/dL — ABNORMAL HIGH (ref 70–99)
Glucose, Bld: 127 mg/dL — ABNORMAL HIGH (ref 70–99)
Glucose, Bld: 138 mg/dL — ABNORMAL HIGH (ref 70–99)
Phosphorus: 3.7 mg/dL (ref 2.5–4.6)
Phosphorus: 3.7 mg/dL (ref 2.5–4.6)
Phosphorus: 4.3 mg/dL (ref 2.5–4.6)
Potassium: 3.6 mmol/L (ref 3.5–5.1)
Potassium: 3.6 mmol/L (ref 3.5–5.1)
Potassium: 3.4 mmol/L — ABNORMAL LOW (ref 3.5–5.1)
Sodium: 134 mmol/L — ABNORMAL LOW (ref 135–145)
Sodium: 136 mmol/L (ref 135–145)
Sodium: 137 mmol/L (ref 135–145)

## 2018-10-04 LAB — CBC WITH DIFFERENTIAL/PLATELET
Abs Immature Granulocytes: 0.01 10*3/uL (ref 0.00–0.07)
Basophils Absolute: 0.1 10*3/uL (ref 0.0–0.1)
Basophils Relative: 1 %
EOS PCT: 15 %
Eosinophils Absolute: 0.9 10*3/uL — ABNORMAL HIGH (ref 0.0–0.5)
HCT: 30.7 % — ABNORMAL LOW (ref 39.0–52.0)
Hemoglobin: 9.2 g/dL — ABNORMAL LOW (ref 13.0–17.0)
Immature Granulocytes: 0 %
Lymphocytes Relative: 25 %
Lymphs Abs: 1.5 10*3/uL (ref 0.7–4.0)
MCH: 26.4 pg (ref 26.0–34.0)
MCHC: 30 g/dL (ref 30.0–36.0)
MCV: 88.2 fL (ref 80.0–100.0)
MONO ABS: 0.6 10*3/uL (ref 0.1–1.0)
MONOS PCT: 10 %
Neutro Abs: 3 10*3/uL (ref 1.7–7.7)
Neutrophils Relative %: 49 %
Platelets: 171 10*3/uL (ref 150–400)
RBC: 3.48 MIL/uL — ABNORMAL LOW (ref 4.22–5.81)
RDW: 17.2 % — ABNORMAL HIGH (ref 11.5–15.5)
WBC: 6 10*3/uL (ref 4.0–10.5)
nRBC: 0 % (ref 0.0–0.2)

## 2018-10-04 LAB — GLUCOSE, CAPILLARY: Glucose-Capillary: 104 mg/dL — ABNORMAL HIGH (ref 70–99)

## 2018-10-04 MED ORDER — HEPARIN SODIUM (PORCINE) 1000 UNIT/ML DIALYSIS: 1000.0000 [IU] | INTRAMUSCULAR | Status: DC | PRN

## 2018-10-04 MED ORDER — POTASSIUM CHLORIDE CRYS ER 20 MEQ PO TBCR
20.0000 meq | EXTENDED_RELEASE_TABLET | Freq: Once | ORAL | Status: DC
  Filled 2018-10-04: qty 1

## 2018-10-04 MED ORDER — SODIUM CHLORIDE 0.9 % IV SOLN: 100.0000 mL | INTRAVENOUS | Status: DC | PRN

## 2018-10-04 MED ORDER — LIDOCAINE HCL (PF) 1 % IJ SOLN: 5.0000 mL | INTRAMUSCULAR | Status: DC | PRN

## 2018-10-04 MED ORDER — CHLORHEXIDINE GLUCONATE CLOTH 2 % EX PADS: 6.0000 | MEDICATED_PAD | Freq: Every day | CUTANEOUS | Status: DC

## 2018-10-04 MED ORDER — PENTAFLUOROPROP-TETRAFLUOROETH EX AERO: 1.0000 "application " | INHALATION_SPRAY | CUTANEOUS | Status: DC | PRN

## 2018-10-04 MED ORDER — HEPARIN SODIUM (PORCINE) 1000 UNIT/ML DIALYSIS: 3000.0000 [IU] | Freq: Once | INTRAMUSCULAR | Status: DC

## 2018-10-04 MED ORDER — LIDOCAINE-PRILOCAINE 2.5-2.5 % EX CREA: 1.0000 "application " | TOPICAL_CREAM | CUTANEOUS | Status: DC | PRN

## 2018-10-04 NOTE — Progress Notes (Signed)
Supervising Physician: Corrie Mckusick  Patient Status:  [facility] - In-pt  Chief Complaint: Abdominal wall fluid collections  Subjective: No complaints.  States his PD catheter was flushed today. For dialysis via HD cath tomorrow.   Allergies: Iodinated diagnostic agents; Lipitor [atorvastatin]; Metoprolol; and Baclofen  Medications: Prior to Admission medications   Medication Sig Start Date End Date Taking? Authorizing Provider  acetaminophen (TYLENOL) 500 MG tablet Take 1,000 mg by mouth every 6 (six) hours as needed for moderate pain or headache.   Yes [provider]  albuterol (PROVENTIL HFA;VENTOLIN HFA) 108 (90 Base) MCG/ACT inhaler Inhale 1-2 puffs into the lungs every 6 (six) hours as needed for wheezing or shortness of breath. 12/18/17  Yes Weaver, Scott T, PA-C  amLODipine (NORVASC) 5 MG tablet Take 1 tablet (5 mg total) by mouth daily. 11/27/17 08/19/26 Yes Richardson Dopp T, PA-C  aspirin EC 81 MG tablet Take 81 mg by mouth daily.   Yes [provider]  calcium acetate (PHOSLO) 667 MG capsule Take 2,001 mg by mouth daily with supper.    Yes [provider]  clopidogrel (PLAVIX) 75 MG tablet Take 1 tablet (75 mg total) by mouth daily. 12/18/17  Yes Weaver, Scott T, PA-C  ezetimibe (ZETIA) 10 MG tablet Take 1 tablet (10 mg total) by mouth daily. 04/02/18 08/19/26 Yes Weaver, Scott T, PA-C  gentamicin cream (GARAMYCIN) 0.1 % Apply 1 application topically daily as needed (prior to accessing PD catheter).  09/28/18  Yes [provider]  HYDROcodone-acetaminophen (NORCO/VICODIN) 5-325 MG tablet Take 1 tablet by mouth every 6 (six) hours as needed for moderate pain.   Yes [provider]  isosorbide mononitrate (IMDUR) 60 MG 24 hr tablet Take 2 tablets (120 mg total) by mouth daily. 08/21/18  Yes Hongalgi, Lenis Dickinson, MD  multivitamin (RENA-VIT) TABS tablet Take 1 tablet by mouth daily.   Yes [provider]  repaglinide (PRANDIN) 0.5 MG tablet  Take 1 tablet (0.5 mg total) by mouth 2 (two) times daily before a meal. 04/05/18  Yes Renato Shin, MD  rosuvastatin (CRESTOR) 10 MG tablet Take 1 tablet (10 mg total) by mouth daily at 6 PM. Patient taking differently: Take 10 mg by mouth daily.  08/21/18  Yes Modena Jansky, MD     Vital Signs: BP (P) 122/63 (BP Location: Left Arm)   Pulse (P) 64   Temp (P) 98.2 F (36.8 C) (Oral)   Resp (P) 20   Ht 5' 6.5" (1.689 m)   Wt 216 lb 11.4 oz (98.3 kg)   SpO2 97%   BMI 34.45 kg/m   Physical Exam  NAD, resting comfortably Abdomen:  2 right lateral abdominal drains in place.  Insertion sites c/d/i.  Output is thin, brown, serous consistent with old hematoma/seroma.   Imaging: Ct Abdomen Pelvis Wo Contrast  Result Date: 10/01/2018 CLINICAL DATA:  Shortness of breath.  Abdominal distention. EXAM: CT ABDOMEN AND PELVIS WITHOUT CONTRAST TECHNIQUE: Multidetector CT imaging of the abdomen and pelvis was performed following the standard protocol without IV contrast. COMPARISON:  None. FINDINGS: Lower chest: Heart is enlarged. Coronary artery calcifications are present. Mitral valve annular calcifications are present. Atherosclerotic changes are noted in the descending aorta. Mild bibasilar airspace disease is present. No significant pleural or pericardial effusion is present. Hepatobiliary: No focal liver abnormality is seen. No gallstones, gallbladder wall thickening, or biliary dilatation. Pancreas: Unremarkable. No pancreatic ductal dilatation or surrounding inflammatory changes. Spleen: Normal in size without focal abnormality.  Adrenals/Urinary Tract: Adrenal glands are normal bilaterally. 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 Ureters are within normal limits. The urinary bladder is collapsed. Stomach/Bowel: A stomach and duodenum are within normal limits. Proximal small bowel is unremarkable. There is focal inflammation of bowel loops of small bowel adjacent to a fluid collection along the  anterior peritoneum on the right. The right anterior peritoneal fluid collection measures 12.9 x 6.5 x 14.6 cm. There is no obstruction. Terminal ileum is otherwise normal. The appendix is visualized and normal. The ascending and transverse colon is mostly collapsed. Diverticular changes are present in the descending and sigmoid colon without focal inflammation. Vascular/Lymphatic: Extensive vascular calcifications are present. There is no aneurysm. No significant adenopathy is present. Reproductive: Prostate is unremarkable. Other: Peritoneal dialysis catheter is in place. The anterior wall fluid collection seen on the previous study is stable. This is just anterior to the new collection. Elder collection measures 8.6 x 5.5 x 7.8 cm. There is some inflammatory change the subcutaneous tissues inferiorly. Musculoskeletal: Vertebral body heights and alignment are maintained. No focal lytic or blastic lesions are present. IMPRESSION: 1. New ventral peritoneal fluid collection measures 12.9 x 6.5 x 14.6 cm. This is worrisome for a loculated fluid collection associated with dialysis or developing abscess related to bowel disease. It appears separate from the other anterior abdominal wall fluid collection. 2. The more remote anterior wall fluid collection is stable. 3. Inflammatory changes of bowel adjacent to the fluid collection, likely reflecting adhesions. There is no obstruction. This is in the region of the previous hernia. 4. Dialysis catheter. 5. Aortic Atherosclerosis (ICD10-I70.0). Extensive vascular calcifications are present without aneurysm. 6. Cystic renal disease of dialysis. 7. Cardiomegaly without failure. 8. Coronary artery disease. Electronically Signed   By: San Morelle M.D.   On: 10/01/2018 21:40   Dg Chest Port 1 View  Result Date: 10/01/2018 CLINICAL DATA:  Dyspnea for the past 2 hours. EXAM: PORTABLE CHEST 1 VIEW COMPARISON:  08/19/2018 FINDINGS: Stable cardiomegaly with aortic  atherosclerosis. Dual lumen dialysis catheter terminates in the right atrium unchanged in position. Median sternotomy sutures and post surgical clips project over the cardiac silhouette. No overt pulmonary edema. Hazy appearance at the left lung base may represent a small layering left effusion. No acute osseous abnormality. Vascular clips project over the superior mediastinum as well as a vascular stent projecting over the right upper thorax in the region of the subclavian artery. IMPRESSION: Stable cardiomegaly. Hazy opacity at the left lung base may represent a small layering left effusion, new since prior. No overt pulmonary edema. No other change of note. Electronically Signed   By: Ashley Royalty M.D.   On: 10/01/2018 20:56   Ct Image Guided Fluid Drain By Catheter  Result Date: 10/02/2018 INDICATION: Right lower quadrant superficial and deep fluid collections EXAM: CT GUIDED DRAINAGE OF RIGHT LOWER QUADRANT SUPERFICIAL AND DEEP FLUID COLLECTIONS MEDICATIONS: The patient is currently admitted to the hospital and receiving intravenous antibiotics. The antibiotics were administered within an appropriate time frame prior to the initiation of the procedure. ANESTHESIA/SEDATION: 2.0 mg IV Versed 100 mcg IV Fentanyl Moderate Sedation Time:  13 The patient was continuously monitored during the procedure by the interventional radiology nurse under my direct supervision. COMPLICATIONS: None immediate. TECHNIQUE: Informed written consent was obtained from the patient after a thorough discussion of the procedural risks, benefits and alternatives. All questions were addressed. Maximal Sterile Barrier Technique was utilized including caps, mask, sterile gowns, sterile gloves, sterile drape, hand hygiene  and skin antiseptic. A timeout was performed prior to the initiation of the procedure. PROCEDURE: Previous imaging reviewed. Patient positioned supine. Noncontrast localization CT performed. The right lower quadrant  superficial and deep abdominal wall fluid collections were localized. Overlying skin marked for drainage of both collections. Under sterile conditions and local anesthesia, 18 gauge 10 cm access needle were advanced into both the superficial and deep collections. Needle positions confirmed with CT. Syringe aspiration yielded thin dark fluid from both collections suspicious for chronic hematomas. Amplatz guidewire inserted followed by tract dilatation to insert 2 10 French drains. Both drain catheter positions confirmed with CT. Catheters secured with Prolene sutures and connected to external suction bulb. Patient tolerated the procedure well. No immediate complication. Sterile dressings applied. FINDINGS: Imaging confirms needle placement into the right lower quadrant superficial and deep fluid collections for drain placements IMPRESSION: Successful CT-guided drain placements (2) within the right lower quadrant superficial and deep abdominal wall fluid collections. Electronically Signed   By: Jerilynn Mages.  Shick M.D.   On: 10/02/2018 14:15   Ct Image Guided Fluid Drain By Catheter  Result Date: 10/02/2018 INDICATION: Right lower quadrant superficial and deep fluid collections EXAM: CT GUIDED DRAINAGE OF RIGHT LOWER QUADRANT SUPERFICIAL AND DEEP FLUID COLLECTIONS MEDICATIONS: The patient is currently admitted to the hospital and receiving intravenous antibiotics. The antibiotics were administered within an appropriate time frame prior to the initiation of the procedure. ANESTHESIA/SEDATION: 2.0 mg IV Versed 100 mcg IV Fentanyl Moderate Sedation Time:  13 The patient was continuously monitored during the procedure by the interventional radiology nurse under my direct supervision. COMPLICATIONS: None immediate. TECHNIQUE: Informed written consent was obtained from the patient after a thorough discussion of the procedural risks, benefits and alternatives. All questions were addressed. Maximal Sterile Barrier Technique was  utilized including caps, mask, sterile gowns, sterile gloves, sterile drape, hand hygiene and skin antiseptic. A timeout was performed prior to the initiation of the procedure. PROCEDURE: Previous imaging reviewed. Patient positioned supine. Noncontrast localization CT performed. The right lower quadrant superficial and deep abdominal wall fluid collections were localized. Overlying skin marked for drainage of both collections. Under sterile conditions and local anesthesia, 18 gauge 10 cm access needle were advanced into both the superficial and deep collections. Needle positions confirmed with CT. Syringe aspiration yielded thin dark fluid from both collections suspicious for chronic hematomas. Amplatz guidewire inserted followed by tract dilatation to insert 2 10 French drains. Both drain catheter positions confirmed with CT. Catheters secured with Prolene sutures and connected to external suction bulb. Patient tolerated the procedure well. No immediate complication. Sterile dressings applied. FINDINGS: Imaging confirms needle placement into the right lower quadrant superficial and deep fluid collections for drain placements IMPRESSION: Successful CT-guided drain placements (2) within the right lower quadrant superficial and deep abdominal wall fluid collections. Electronically Signed   By: Jerilynn Mages.  Shick M.D.   On: 10/02/2018 14:15    Labs:  CBC: Recent Labs    10/01/18 1927 10/02/18 0702 10/03/18 0351 10/04/18 0238  WBC 6.9 5.8 5.4  5.2 6.0  HGB 9.0* 7.7* 9.4*  9.4* 9.2*  HCT 31.1* 25.4* 30.2*  31.1* 30.7*  PLT 195 175 175  182 171    COAGS: Recent Labs    08/19/18 1716  INR 1.06  APTT 34    BMP: Recent Labs    10/01/18 1927 10/02/18 0702 10/03/18 0351 10/04/18 0238  NA 139 139 136  134* 137  K 3.4* 3.8 3.6  3.6 3.4*  CL 103 103  100  99 100  CO2 23 23 24  25 26   GLUCOSE 101* 87 127*  125* 138*  BUN 29* 33* 19  19 35*  CALCIUM 9.7 8.9 9.1  9.1 8.6*  CREATININE 10.09*  11.13* 7.37*  7.25* 10.69*  GFRNONAA 5* 4* 7*  7* 5*  GFRAA 6* 5* 8*  8* 5*    LIVER FUNCTION TESTS: Recent Labs    05/11/18 1735  07/02/18 0735 08/19/18 1716 08/21/18 0739 10/01/18 1927 10/03/18 0351 10/04/18 0238  BILITOT 1.0  --  0.3 0.5  --  0.7  --   --   AST 34  --  14 19  --  20  --   --   ALT 12  --  8 10  --  11  --   --   ALKPHOS 53  --  173* 63  --  70  --   --   PROT 7.3  --  7.3 8.6*  --  7.1  --   --   ALBUMIN 3.4*   < > 4.5 4.1 3.5 3.3* 2.9*  2.9* 2.9*   < > = values in this interval not displayed.    Assessment and Plan: Abdominal wall fluid collections s/p drain placement x2 by Dr. Annamaria Boots Patient's PD catheter was flushed today without pain or issue.  Seroma/Hematoma drains remain in place.  No change to output.  Still 20-30 mL per day.  Discussed case with Dr. Earleen Newport. If output remains stable or is improved tomorrow, will plan to remove drains.  Continue current care for now.   Electronically Signed: Docia Barrier, PA 10/04/2018, 3:59 PM   I spent a total of 15 Minutes at the the patient's bedside AND on the patient's hospital floor or unit, greater than 50% of which was counseling/coordinating care for abdominal wall fluid collections.

## 2018-10-04 NOTE — Progress Notes (Signed)
Progress Note  Patient Name: Patrick [name] Date of Encounter: 10/04/2018  Primary Cardiologist: Dorris Carnes, MD   Subjective   Feels well, no cardiac complaints, tolerating bisoprolol well.   Inpatient Medications    Scheduled Meds: . amLODipine  5 mg Oral [name]  . bisoprolol  5 mg Oral [name]  . calcium acetate  2,001 mg Oral Q supper  . Chlorhexidine Gluconate Cloth  6 each Topical Q0600  . diphenhydrAMINE  25 mg Oral Once  . ezetimibe  10 mg Oral [name]  . feeding supplement (NEPRO CARB STEADY)  237 mL Oral Q1500  . heparin  3,000 Units Dialysis Once in dialysis  . isosorbide mononitrate  120 mg Oral [name]  . multivitamin  1 tablet Oral [name]  . pneumococcal 23 valent vaccine  0.5 mL Intramuscular Tomorrow-1000  . potassium chloride  20 mEq Oral Once  . repaglinide  0.5 mg Oral BID AC  . rosuvastatin  10 mg Oral [name]  . sodium chloride flush  5 mL Intracatheter Q8H   Continuous Infusions: . sodium chloride    . sodium chloride     PRN Meds: sodium chloride, sodium chloride, acetaminophen, albuterol, heparin, heparin, heparin, lidocaine (PF), lidocaine-prilocaine, ondansetron **OR** ondansetron (ZOFRAN) IV, pentafluoroprop-tetrafluoroeth, senna-docusate   Vital Signs    Vitals:   10/04/18 0952 10/04/18 0952 10/04/18 1314 10/04/18 1616  BP: 118/68 118/66 122/63 110/90  Pulse:  72 64 63  Resp:  18 20 18   Temp:  98.3 F (36.8 C) 98.2 F (36.8 C) 98.7 F (37.1 C)  TempSrc:  Oral Oral   SpO2:  97%  93%  Weight:      Height:        Intake/Output Summary (Last 24 hours) at 10/04/2018 1934 Last data filed at 10/04/2018 1800 Gross per 24 hour  Intake 360 ml  Output 77 ml  Net 283 ml   Filed Weights   10/02/18 0100 10/02/18 1352 10/02/18 1807  Weight: 101.1 kg 102.9 kg 98.3 kg    Telemetry    SR - Personally Reviewed  ECG    No new - Personally Reviewed  Physical Exam   GEN: No acute distress.   Neck: No JVD Cardiac: regular rhythm, normal rate, no  murmurs, rubs, or gallops.  Respiratory: Clear to auscultation bilaterally. GI: Soft, nontender, non-distended  MS: No edema; No deformity. Neuro:  Nonfocal  Psych: Normal affect   Labs    Chemistry Recent Labs  Lab 10/01/18 1927 10/02/18 0702 10/03/18 0351 10/04/18 0238  NA 139 139 136  134* 137  K 3.4* 3.8 3.6  3.6 3.4*  CL 103 103 100  99 100  CO2 23 23 24  25 26   GLUCOSE 101* 87 127*  125* 138*  BUN 29* 33* 19  19 35*  CREATININE 10.09* 11.13* 7.37*  7.25* 10.69*  CALCIUM 9.7 8.9 9.1  9.1 8.6*  PROT 7.1  --   --   --   ALBUMIN 3.3*  --  2.9*  2.9* 2.9*  AST 20  --   --   --   ALT 11  --   --   --   ALKPHOS 70  --   --   --   BILITOT 0.7  --   --   --   GFRNONAA 5* 4* 7*  7* 5*  GFRAA 6* 5* 8*  8* 5*  ANIONGAP 13 13 12  10 11      Hematology Recent Labs  Lab 10/02/18  8413 10/03/18 0351 10/04/18 0238  WBC 5.8 5.4  5.2 6.0  RBC 2.84* 3.42*  3.52* 3.48*  HGB 7.7* 9.4*  9.4* 9.2*  HCT 25.4* 30.2*  31.1* 30.7*  MCV 89.4 88.3  88.4 88.2  MCH 27.1 27.5  26.7 26.4  MCHC 30.3 31.1  30.2 30.0  RDW 17.7* 17.6*  17.5* 17.2*  PLT 175 175  182 171    Cardiac Enzymes Recent Labs  Lab 10/02/18 0039 10/02/18 0702 10/03/18 0351  TROPONINI 0.32* 0.43* 0.29*    Recent Labs  Lab 10/01/18 1934 10/02/18 0046  TROPIPOC 0.22* 0.36*     BNP Recent Labs  Lab 10/02/18 0039  BNP 3,561.2*     DDimer No results for input(s): DDIMER in the last 168 hours.   Radiology    No results found.  Cardiac Studies   Echo 08/20/2018: EF 50-55%, severe MAC, mild AS.  Patient Profile     63 y.o. 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  He is being followed by cardiology for chest pain and abnormal troponin I levels.  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  - recommend continued medical management. Tolerating bisoprolol (Zebeta) well. Has exertional symptoms but no symptoms during dialysis which is reassuring.   For questions or updates, please contact [place] Please consult [email or web address] for contact info under        Signed, Elouise Munroe, MD  10/04/2018, 7:34 PM

## 2018-10-04 NOTE — Progress Notes (Addendum)
PROGRESS NOTE    Patrick [name]  ATF:[id number] DOB: [birth year] DOA: 10/01/2018 PCP: Nolene Ebbs, MD    Brief Narrative:  Patrick [name] is a 63 y.o. male with medical history significant of ESRD on TTS HD currently.  Patient had PD catheter placement on 09/13/2018 at [facility].  Had HD yesterday without complications.  Has not yet started PD.  Today he presents to ED with c/o RUQ abd pain, associated with some SOB with exertion.   ED Course: In ED patient noted to have elevated troponin of 0.22, no leukocytosis, no fever, CT reveals mild bibasilar airspace disease, and a large peritoneal fluid collection, seroma vs abscess.  [facility] is unable to admit patient due to no beds available.  Patient requests that he be given a regular diet.  I tried to point out to him that not being on a renal diet could be whats making his BP elevated and I was somewhat suspicious that he may have some fluid retention with this.  He replied to me "no my blood pressure is elevated because I didn't take my meds today".   Assessment & Plan:   Principal Problem:   Peritoneal abscess [facility]) Active Problems:   Essential hypertension   Dyspnea   Chest pain   Elevated troponin   Controlled diabetes mellitus type 2 with complications (HCC)   History of MI (myocardial infarction)   ESRD (end stage renal disease) (HCC)   SOB (shortness of breath)  1 peritoneal abscess versus seroma Patient presented with right-sided abdominal pain.  CT abdomen and pelvis done concerning for abscess formation versus seroma.  Patient has been pancultured.  Patient with recent robotic incarcerated incisional hernia repair with mesh, extensive lysis of adhesions, and peritoneal dialysis catheter placement with Augmentin to pexy right upper quadrant 09/13/2018 per Dr. Raul Del. IR consulted for drain of abscesses and cultures to be sent.  CT-guided drain placement with cultures sent with no organisms to date.  Patient with clinical  improvement.  Discontinued IV vancomycin and IV Rocephin and patient has remained afebrile for the past 24 hours with clinical improvement. General surgery following and appreciate input and recommendations.  IR to advise when aspirin and Plavix may be resumed hopefully in the next 24 hours.  Patient will need to follow-up with his surgeon Dr. Raul Del post discharge.  Discussed antibiotic management with ID.  2.  Elevated troponin/chest pain Patient presented with shortness of breath on minimal exertion as well as complaints of intermittent chest pain on the left described as a dull sensation with some radiation to the left upper extremity.  EKG with T wave inversions in leads I and aVL similar to EKG from 08/19/2018.  Patient however with multiple risk factors of end-stage renal disease on hemodialysis, prior history of coronary artery disease, diabetes mellitus type 2 and as such cardiology was consulted.  Patient with recent 2D echo done.  On 08/20/2018 with normal LV systolic function of 50 to 55%, mild LVH, mild diastolic dysfunction, calcified aortic valve with mild left ear, no wall motion abnormalities.  We will not repeat 2D echo at this time.  Continue Crestor, Imdur.  Patient seen in consultation by cardiology who are recommending continuation of medical therapy with aspirin, Plavix, Norvasc, Zetia, high-dose Imdur and Crestor.  Zebeta added to patient's regimen due to history of intolerance to metoprolol due to headaches.  Per cardiology no further ischemic testing planned at this time.  IR to advise when patient's aspirin and Plavix may be resumed.  3.  Diabetes mellitus type 2 Hemoglobin A1c was 6.9 on 08/20/2018.  Patient insistent on being placed on a regular diet.  Continue Prandin.  Follow.  4.  Hyperlipidemia Stable.  Continue statin.   5.  End-stage renal disease on hemodialysis Nephrology has been notified of patient's admission.  Patient underwent hemodialysis on 10/02/2018.  Per  nephrology.   6.  Shortness of breath Questionable etiology.  Could be secondary to problem #2 versus volume overload secondary to end-stage renal disease.  Bibasilar crackles noted on examination.  Patient improved clinically post hemodialysis.  Currently stable.  Nephrology following.  7.  Hypertension Blood pressure stable on current regimen of Imdur and Norvasc.  Patient also on low-dose beta-blocker per cardiology. Patient underwent hemodialysis on 10/02/2018.    DVT prophylaxis: SCDs Code Status: Full Family Communication: Updated patient.  No family at bedside. Disposition Plan: Likely home tomorrow if decreasing drainage from JP tubes and if they are removed by IR.    Consultants:   IR: Dr. Annamaria Boots 10/02/2018  General surgery: Dr. Romana Juniper 10/01/2018  Nephrology: Dr.Schertz 10/02/2018  Cardiology: Dr. Domenic Polite 10/02/2018  Procedures:   CT abdomen and pelvis 10/01/2018  Chest x-ray 10/01/2018  CT-guided drainage of right lower quadrant superficial and deep fluid collection a cement of 2 drains in the right lower quadrant per Dr.Shick 10/02/2018  Antimicrobials:   IV vancomycin 10/02/2018>>>>>> 10/03/2018  IV Rocephin 10/02/2018>>>>>> 10/03/2018   Subjective: Patient states shortness of breath improved.  Feels well.  Denies any chest pain.  Abdominal pain improving.  Asking whether he is going to be able to go home today.   Objective: Vitals:   10/03/18 1654 10/03/18 2337 10/04/18 0952 10/04/18 0952  BP: 127/69 140/85 118/68 118/66  Pulse: 67 73  72  Resp: 18 18  18   Temp: 98.7 F (37.1 C) 99.2 F (37.3 C)  98.3 F (36.8 C)  TempSrc:  Oral  Oral  SpO2: 99% 92%  97%  Weight:      Height:        Intake/Output Summary (Last 24 hours) at 10/04/2018 1048 Last data filed at 10/04/2018 0900 Gross per 24 hour  Intake 410 ml  Output 50 ml  Net 360 ml   Filed Weights   10/02/18 0100 10/02/18 1352 10/02/18 1807  Weight: 101.1 kg 102.9 kg 98.3 kg    Examination:  General  exam: NAD Respiratory system: Lungs clear to auscultation bilaterally.  No wheezes, no crackles, no rhonchi. Respiratory effort normal. Cardiovascular system: RRR no murmurs rubs or gallops.  No JVD.  No lower extremity edema.  Gastrointestinal system: Abdomen is soft, nondistended, decreased tenderness to palpation in the right midabdomen, positive bowel sounds.  Palpable subcutaneous mass in the region around the hernia repair site in the right lower quadrant.  Abdominal binder in place.  JP tubes with minimal amount of brown drainage noted. Central nervous system: Alert and oriented. No focal neurological deficits. Extremities: Symmetric 5 x 5 power. Skin: No rashes, lesions or ulcers Psychiatry: Judgement and insight appear normal. Mood & affect appropriate.     Data Reviewed: I have personally reviewed following labs and imaging studies  CBC: Recent Labs  Lab 10/01/18 1927 10/02/18 0702 10/03/18 0351 10/04/18 0238  WBC 6.9 5.8 5.4  5.2 6.0  NEUTROABS  --   --  2.8 3.0  HGB 9.0* 7.7* 9.4*  9.4* 9.2*  HCT 31.1* 25.4* 30.2*  31.1* 30.7*  MCV 91.5 89.4 88.3  88.4 88.2  PLT 195 175  175  182 466   Basic Metabolic Panel: Recent Labs  Lab 10/01/18 1927 10/02/18 0702 10/03/18 0351 10/04/18 0238  NA 139 139 136  134* 137  K 3.4* 3.8 3.6  3.6 3.4*  CL 103 103 100  99 100  CO2 23 23 24  25 26   GLUCOSE 101* 87 127*  125* 138*  BUN 29* 33* 19  19 35*  CREATININE 10.09* 11.13* 7.37*  7.25* 10.69*  CALCIUM 9.7 8.9 9.1  9.1 8.6*  PHOS  --   --  3.7  3.7 4.3   GFR: Estimated Creatinine Clearance: 7.7 mL/min (A) (by C-G formula based on SCr of 10.69 mg/dL (H)). Liver Function Tests: Recent Labs  Lab 10/01/18 1927 10/03/18 0351 10/04/18 0238  AST 20  --   --   ALT 11  --   --   ALKPHOS 70  --   --   BILITOT 0.7  --   --   PROT 7.1  --   --   ALBUMIN 3.3* 2.9*  2.9* 2.9*   Recent Labs  Lab 10/01/18 1927  LIPASE 41   No results for input(s): AMMONIA in  the last 168 hours. Coagulation Profile: No results for input(s): INR, PROTIME in the last 168 hours. Cardiac Enzymes: Recent Labs  Lab 10/02/18 0039 10/02/18 0702 10/03/18 0351  TROPONINI 0.32* 0.43* 0.29*   BNP (last 3 results) No results for input(s): PROBNP in the last 8760 hours. HbA1C: No results for input(s): HGBA1C in the last 72 hours. CBG: Recent Labs  Lab 10/02/18 0834 10/02/18 1853 10/02/18 2049  GLUCAP 77 62* 96   Lipid Profile: No results for input(s): CHOL, HDL, LDLCALC, TRIG, CHOLHDL, LDLDIRECT in the last 72 hours. Thyroid Function Tests: No results for input(s): TSH, T4TOTAL, FREET4, T3FREE, THYROIDAB in the last 72 hours. Anemia Panel: Recent Labs    10/02/18 0851 10/03/18 0351  VITAMINB12  --  330  FOLATE 9.5  --   FERRITIN 1,319*  --   TIBC 148*  --   IRON 24*  --    Sepsis Labs: No results for input(s): PROCALCITON, LATICACIDVEN in the last 168 hours.  Recent Results (from the past 240 hour(s))  Culture, blood (routine x 2)     Status: None (Preliminary result)   Collection Time: 10/02/18 12:25 AM  Result Value Ref Range Status   Specimen Description BLOOD LEFT ARM  Final   Special Requests   Final    BOTTLES DRAWN AEROBIC AND ANAEROBIC Blood Culture adequate volume   Culture   Final    NO GROWTH 2 DAYS Performed at [facility], [address] [place], [place] [address]    Report Status PENDING  Incomplete  Culture, blood (routine x 2)     Status: None (Preliminary result)   Collection Time: 10/02/18 12:40 AM  Result Value Ref Range Status   Specimen Description BLOOD LEFT HAND  Final   Special Requests   Final    BOTTLES DRAWN AEROBIC ONLY Blood Culture results may not be optimal due to an excessive volume of blood received in culture bottles   Culture   Final    NO GROWTH 2 DAYS Performed at [facility], [place] [address], [place], [place] [address]    Report Status PENDING  Incomplete  MRSA PCR Screening     Status:  None   Collection Time: 10/02/18  7:25 AM  Result Value Ref Range Status   MRSA by PCR NEGATIVE  NEGATIVE Final    Comment:        The GeneXpert MRSA Assay (FDA approved for NASAL specimens only), is one component of a comprehensive MRSA colonization surveillance program. It is not intended to diagnose MRSA infection nor to guide or monitor treatment for MRSA infections. Performed at [facility], [place] [address], [place], [place] [address]   Aerobic/Anaerobic Culture (surgical/deep wound)     Status: None (Preliminary result)   Collection Time: 10/02/18  2:26 PM  Result Value Ref Range Status   Specimen Description WOUND ABDOMEN  Final   Special Requests Normal  Final   Gram Stain   Final    RARE WBC PRESENT, PREDOMINANTLY MONONUCLEAR NO ORGANISMS SEEN    Culture   Final    NO GROWTH 2 DAYS NO ANAEROBES ISOLATED; CULTURE IN PROGRESS FOR 5 DAYS Performed at [facility], [place] [address] [place], [place] [address]    Report Status PENDING  Incomplete  Aerobic/Anaerobic Culture (surgical/deep wound)     Status: None (Preliminary result)   Collection Time: 10/02/18  2:26 PM  Result Value Ref Range Status   Specimen Description WOUND ABDOMEN  Final   Special Requests Normal  Final   Gram Stain   Final    FEW WBC PRESENT, PREDOMINANTLY MONONUCLEAR NO ORGANISMS SEEN    Culture   Final    NO GROWTH 2 DAYS NO ANAEROBES ISOLATED; CULTURE IN PROGRESS FOR 5 DAYS Performed at [facility], [place] [address] [place], [place] [address]    Report Status PENDING  Incomplete         Radiology Studies: Ct Image Guided Fluid Drain By Catheter  Result Date: 10/02/2018 INDICATION: Right lower quadrant superficial and deep fluid collections EXAM: CT GUIDED DRAINAGE OF RIGHT LOWER QUADRANT SUPERFICIAL AND DEEP FLUID COLLECTIONS MEDICATIONS: The patient is currently admitted to the hospital and receiving intravenous antibiotics. The antibiotics were administered within an  appropriate time frame prior to the initiation of the procedure. ANESTHESIA/SEDATION: 2.0 mg IV Versed 100 mcg IV Fentanyl Moderate Sedation Time:  13 The patient was continuously monitored during the procedure by the interventional radiology nurse under my direct supervision. COMPLICATIONS: None immediate. TECHNIQUE: Informed written consent was obtained from the patient after a thorough discussion of the procedural risks, benefits and alternatives. All questions were addressed. Maximal Sterile Barrier Technique was utilized including caps, mask, sterile gowns, sterile gloves, sterile drape, hand hygiene and skin antiseptic. A timeout was performed prior to the initiation of the procedure. PROCEDURE: Previous imaging reviewed. Patient positioned supine. Noncontrast localization CT performed. The right lower quadrant superficial and deep abdominal wall fluid collections were localized. Overlying skin marked for drainage of both collections. Under sterile conditions and local anesthesia, 18 gauge 10 cm access needle were advanced into both the superficial and deep collections. Needle positions confirmed with CT. Syringe aspiration yielded thin dark fluid from both collections suspicious for chronic hematomas. Amplatz guidewire inserted followed by tract dilatation to insert 2 10 French drains. Both drain catheter positions confirmed with CT. Catheters secured with Prolene sutures and connected to external suction bulb. Patient tolerated the procedure well. No immediate complication. Sterile dressings applied. FINDINGS: Imaging confirms needle placement into the right lower quadrant superficial and deep fluid collections for drain placements IMPRESSION: Successful CT-guided drain placements (2) within the right lower quadrant superficial and deep abdominal wall fluid collections. Electronically Signed   By: Jerilynn Mages.  Shick M.D.   On: 10/02/2018 14:15   Ct  Image Guided Fluid Drain By Catheter  Result Date:  10/02/2018 INDICATION: Right lower quadrant superficial and deep fluid collections EXAM: CT GUIDED DRAINAGE OF RIGHT LOWER QUADRANT SUPERFICIAL AND DEEP FLUID COLLECTIONS MEDICATIONS: The patient is currently admitted to the hospital and receiving intravenous antibiotics. The antibiotics were administered within an appropriate time frame prior to the initiation of the procedure. ANESTHESIA/SEDATION: 2.0 mg IV Versed 100 mcg IV Fentanyl Moderate Sedation Time:  13 The patient was continuously monitored during the procedure by the interventional radiology nurse under my direct supervision. COMPLICATIONS: None immediate. TECHNIQUE: Informed written consent was obtained from the patient after a thorough discussion of the procedural risks, benefits and alternatives. All questions were addressed. Maximal Sterile Barrier Technique was utilized including caps, mask, sterile gowns, sterile gloves, sterile drape, hand hygiene and skin antiseptic. A timeout was performed prior to the initiation of the procedure. PROCEDURE: Previous imaging reviewed. Patient positioned supine. Noncontrast localization CT performed. The right lower quadrant superficial and deep abdominal wall fluid collections were localized. Overlying skin marked for drainage of both collections. Under sterile conditions and local anesthesia, 18 gauge 10 cm access needle were advanced into both the superficial and deep collections. Needle positions confirmed with CT. Syringe aspiration yielded thin dark fluid from both collections suspicious for chronic hematomas. Amplatz guidewire inserted followed by tract dilatation to insert 2 10 French drains. Both drain catheter positions confirmed with CT. Catheters secured with Prolene sutures and connected to external suction bulb. Patient tolerated the procedure well. No immediate complication. Sterile dressings applied. FINDINGS: Imaging confirms needle placement into the right lower quadrant superficial and deep  fluid collections for drain placements IMPRESSION: Successful CT-guided drain placements (2) within the right lower quadrant superficial and deep abdominal wall fluid collections. Electronically Signed   By: Jerilynn Mages.  Shick M.D.   On: 10/02/2018 14:15        Scheduled Meds: . sodium chloride   Intravenous Once  . acetaminophen  650 mg Oral Once  . amLODipine  5 mg Oral Daily  . bisoprolol  5 mg Oral Daily  . calcium acetate  2,001 mg Oral Q supper  . Chlorhexidine Gluconate Cloth  6 each Topical Q0600  . diphenhydrAMINE  25 mg Oral Once  . ezetimibe  10 mg Oral Daily  . feeding supplement (NEPRO CARB STEADY)  237 mL Oral Q1500  . isosorbide mononitrate  120 mg Oral Daily  . multivitamin  1 tablet Oral Daily  . pneumococcal 23 valent vaccine  0.5 mL Intramuscular Tomorrow-1000  . potassium chloride  20 mEq Oral Once  . repaglinide  0.5 mg Oral BID AC  . rosuvastatin  10 mg Oral Daily  . sodium chloride flush  5 mL Intracatheter Q8H   Continuous Infusions: . sodium chloride    . sodium chloride       LOS: 1 day    Time spent: 35 minutes    Irine Seal, MD [facility]  If 7PM-7AM, please contact night-coverage [email or web address] 10/04/2018, 10:48 AM

## 2018-10-04 NOTE — Progress Notes (Addendum)
Pt alert/oriented in no acute distress. Performed PD cath flush 1.5% solution and flushed for 1 liter as ordered without issue. VSS.  Report given to primary nurse.

## 2018-10-04 NOTE — Progress Notes (Signed)
[facility] Progress Note   Assessment/ Plan:   TTS [place] 4h 2K/3.5 bath 400/800 101.5kg TDC LIJ Hep 3000   Assessment: 1. Abd pain/ fluid collections x2 by CT in region of recently placed PD cath - ?infection vs seroma, sp drain placement x 2 3/7 with IR.  Cx's negative x 2 days, antibiotics stopped.  D/w hospitalist- they will discuss with IR re: keeping drains in vs taking out.  Pt has f/u appointment 3/11 at 2:15 pm with Dr. Raul Del.    2. ESRD - on HD TTS. SP recent PD cath 09/13/18 Dr Raul Del along w/ hernia repair w/ mesh.  Will continue HD here, next rx tomorrow. PD cath flush today.  Will not start PD training until we've sorted out these fluid collections- d/w HT today.   3. SOB/ vol overload - losing body wt, as above. Better after HD yest.   4. Hx failed renal Tx  5. HTN on norvasc/ bisoprolol, BP's good 6. CAD hx MI 7. DM2 not on insulin 8. Anemia ckd - Hb ok, up after vol removal and got 1u prbc yest.   Subjective:    Seen in room.  Feeling much better.  Minimal drainage from JPs.  Cultures neg x 2 days.    Objective:   BP 118/66   Pulse 72   Temp 98.3 F (36.8 C) (Oral)   Resp 18   Ht 5' 6.5" (1.689 m)   Wt 98.3 kg   SpO2 97%   BMI 34.45 kg/m   Physical Exam: Gen: obese, NAD CVS: RRR Resp: clear Abd: PD cath in LLQ, dressed.  Port sites with steri-strips- no drainage/erythema.  2 JPs RLQ with minimal serosanguinous output Ext: 1+ LE edema ACCESS: PD cath, dressed, and L IJ [facility]  Labs: BMET Recent Labs  Lab 10/01/18 1927 10/02/18 0702 10/03/18 0351 10/04/18 0238  NA 139 139 136  134* 137  K 3.4* 3.8 3.6  3.6 3.4*  CL 103 103 100  99 100  CO2 23 23 24  25 26   GLUCOSE 101* 87 127*  125* 138*  BUN 29* 33* 19  19 35*  CREATININE 10.09* 11.13* 7.37*  7.25* 10.69*  CALCIUM 9.7 8.9 9.1  9.1 8.6*  PHOS  --   --  3.7  3.7 4.3   CBC Recent Labs  Lab 10/01/18 1927 10/02/18 0702 10/03/18 0351 10/04/18 0238  WBC  6.9 5.8 5.4  5.2 6.0  NEUTROABS  --   --  2.8 3.0  HGB 9.0* 7.7* 9.4*  9.4* 9.2*  HCT 31.1* 25.4* 30.2*  31.1* 30.7*  MCV 91.5 89.4 88.3  88.4 88.2  PLT 195 175 175  182 171    @IMGRELPRIORS @ Medications:    . sodium chloride   Intravenous Once  . acetaminophen  650 mg Oral Once  . amLODipine  5 mg Oral Daily  . bisoprolol  5 mg Oral Daily  . calcium acetate  2,001 mg Oral Q supper  . Chlorhexidine Gluconate Cloth  6 each Topical Q0600  . diphenhydrAMINE  25 mg Oral Once  . ezetimibe  10 mg Oral Daily  . feeding supplement (NEPRO CARB STEADY)  237 mL Oral Q1500  . isosorbide mononitrate  120 mg Oral Daily  . multivitamin  1 tablet Oral Daily  . pneumococcal 23 valent vaccine  0.5 mL Intramuscular Tomorrow-1000  . potassium chloride  20 mEq Oral Once  . repaglinide  0.5 mg Oral BID AC  . rosuvastatin  10  mg Oral Daily  . sodium chloride flush  5 mL Intracatheter Q8H     Madelon Lips MD [facility] pgr [phone] 10/04/2018, 11:07 AM

## 2018-10-05 LAB — CBC WITH DIFFERENTIAL/PLATELET
Abs Immature Granulocytes: 0.02 10*3/uL (ref 0.00–0.07)
Basophils Absolute: 0.1 10*3/uL (ref 0.0–0.1)
Basophils Relative: 1 %
Eosinophils Absolute: 1 10*3/uL — ABNORMAL HIGH (ref 0.0–0.5)
Eosinophils Relative: 16 %
HCT: 30.8 % — ABNORMAL LOW (ref 39.0–52.0)
HEMOGLOBIN: 9.1 g/dL — AB (ref 13.0–17.0)
Immature Granulocytes: 0 %
Lymphocytes Relative: 21 %
Lymphs Abs: 1.4 10*3/uL (ref 0.7–4.0)
MCH: 26.2 pg (ref 26.0–34.0)
MCHC: 29.5 g/dL — ABNORMAL LOW (ref 30.0–36.0)
MCV: 88.8 fL (ref 80.0–100.0)
Monocytes Absolute: 0.4 10*3/uL (ref 0.1–1.0)
Monocytes Relative: 7 %
Neutro Abs: 3.5 10*3/uL (ref 1.7–7.7)
Neutrophils Relative %: 55 %
Platelets: 177 10*3/uL (ref 150–400)
RBC: 3.47 MIL/uL — AB (ref 4.22–5.81)
RDW: 17.1 % — ABNORMAL HIGH (ref 11.5–15.5)
WBC: 6.4 10*3/uL (ref 4.0–10.5)
nRBC: 0 % (ref 0.0–0.2)

## 2018-10-05 LAB — RENAL FUNCTION PANEL
Albumin: 3 g/dL — ABNORMAL LOW (ref 3.5–5.0)
Anion gap: 17 — ABNORMAL HIGH (ref 5–15)
BUN: 49 mg/dL — ABNORMAL HIGH (ref 8–23)
CO2: 22 mmol/L (ref 22–32)
Calcium: 8 mg/dL — ABNORMAL LOW (ref 8.9–10.3)
Chloride: 100 mmol/L (ref 98–111)
Creatinine, Ser: 14.47 mg/dL — ABNORMAL HIGH (ref 0.61–1.24)
GFR calc Af Amer: 4 mL/min — ABNORMAL LOW (ref >60)
GFR calc non Af Amer: 3 mL/min — ABNORMAL LOW (ref >60)
Glucose, Bld: 163 mg/dL — ABNORMAL HIGH (ref 70–99)
Phosphorus: 5.5 mg/dL — ABNORMAL HIGH (ref 2.5–4.6)
Potassium: 3.5 mmol/L (ref 3.5–5.1)
Sodium: 139 mmol/L (ref 135–145)

## 2018-10-05 MED ORDER — ALTEPLASE 2 MG IJ SOLR
2.0000 mg | Freq: Once | INTRAMUSCULAR | Status: AC | PRN
  Administered 2018-10-05: 2 mg

## 2018-10-05 MED ORDER — ALTEPLASE 2 MG IJ SOLR
INTRAMUSCULAR | Status: AC
  Administered 2018-10-05: 2 mg
  Filled 2018-10-05: qty 2

## 2018-10-05 MED ORDER — BISOPROLOL FUMARATE 5 MG PO TABS: 5.0000 mg | ORAL_TABLET | Freq: Every day | ORAL | 1 refills | Status: DC

## 2018-10-05 NOTE — Discharge Summary (Addendum)
Physician Discharge Summary  ZAMEER BORMAN XVQ:[id number] DOB: 04/09/1956 DOA: 10/01/2018  PCP: Nolene Ebbs, MD  Admit date: 10/01/2018 Discharge date: 10/05/2018  Time spent: 55 minutes  Recommendations for Outpatient Follow-up:  1. Follow-up with Dr. Raul Del, general surgery on 10/06/2018 at 2:15 PM as scheduled.  On follow-up abscesses will need to be reassessed at that time. 2. Follow-up with Richardson Dopp, PA cardiology 10/15/2018 at 8:45 AM. 3. Follow-up with Nolene Ebbs, MD in 2 weeks.   Discharge Diagnoses:  Principal Problem:   Peritoneal abscess [facility] Active Problems:   Essential hypertension   Dyspnea   Chest pain   Elevated troponin   Controlled diabetes mellitus type 2 with complications (HCC)   History of MI (myocardial infarction)   ESRD (end stage renal disease) (HCC)   SOB (shortness of breath)   Discharge Condition: Stable and improved  Diet recommendation: Heart healthy  Filed Weights   10/02/18 1352 10/02/18 1807 10/05/18 0930  Weight: 102.9 kg 98.3 kg 98.6 kg    History of present illness:  Per Dr. Erline Levine is a 63 y.o. male with medical history significant of ESRD on TTS HD currently.  Patient had PD catheter placement on 09/13/2018 at [facility].  Had HD yesterday without complications.  Has not yet started PD.  On day of admission, he presented to ED with c/o RUQ abd pain, associated with some SOB with exertion.   ED Course: In ED patient noted to have elevated troponin of 0.22, no leukocytosis, no fever, CT revealled mild bibasilar airspace disease, and a large peritoneal fluid collection, seroma vs abscess.  [facility] was unable to admit patient due to no beds available.  Patient requested that he be given a regular diet.  I tried to point out to him that not being on a renal diet could be whats making his BP elevated and I was somewhat suspicious that he may have some fluid retention with this.  He replied to me "no my blood pressure is  elevated because I didn't take my meds today".  Hospital Course:  1 peritoneal abscess versus seroma Patient presented with right-sided abdominal pain.  CT abdomen and pelvis done concerning for abscess formation versus seroma.  Patient has been pancultured.  Patient with recent robotic incarcerated incisional hernia repair with mesh, extensive lysis of adhesions, and peritoneal dialysis catheter placement with Augmentin to pexy right upper quadrant 09/13/2018 per Dr. Raul Del. IR consulted for drain of abscesses and cultures to be sent.  CT-guided drain placement with cultures sent with no organisms to date.  Patient vancomycin IV Rocephin.  Patient improved clinically remained afebrile.  IV antibiotics were subsequently discontinued and patient remained afebrile for 48 hours after antibiotics were discontinued with clinical improvement.  General surgery was consulted and followed the patient throughout the hospitalization and felt no surgical intervention needed at this time and recommended close outpatient follow-up with patient's surgeon Dr. Raul Del.  IR followed the patient and drains were subsequently removed on day of discharge 10/05/2018 and was felt patient's fluid collections were consistent with old hematomas.  Antibiotic management was discussed with ID who was in agreement with discontinuation of IV antibiotics and monitoring off antibiotics. Patient will need to follow-up with his surgeon Dr. Raul Del post discharge.   2.  Elevated troponin/chest pain Patient presented with shortness of breath on minimal exertion as well as complaints of intermittent chest pain on the left described as a dull sensation with some radiation to the left upper extremity.  EKG with T wave inversions in leads I and aVL similar to EKG from 08/19/2018.  Patient however with multiple risk factors of end-stage renal disease on hemodialysis, prior history of coronary artery disease, diabetes mellitus type 2 and as such  cardiology was consulted.  Patient with recent 2D echo done.  On 08/20/2018 with normal LV systolic function of 50 to 55%, mild LVH, mild diastolic dysfunction, calcified aortic valve with mild left ear, no wall motion abnormalities. 2D echo was not repeated as one was recently done.  Patient maintained on home regimen of Crestor, Imdur.  Patient seen in consultation by cardiology who are recommended continuation of medical therapy with aspirin, Plavix, Norvasc, Zetia, high-dose Imdur and Crestor.  Zebeta added to patient's regimen due to history of intolerance to metoprolol due to headaches.  Per cardiology no further ischemic testing planned at this time.  Patient will be resumed back on home regimen of aspirin and Plavix on discharge.    3.  Diabetes mellitus type 2 Hemoglobin A1c was 6.9 on 08/20/2018.  Patient insistent on being placed on a regular diet.  Patient maintained on home regimen of Prandin.  Blood glucose levels remain well controlled during the hospitalization.  4.  Hyperlipidemia Patient maintained on statin throughout the hospitalization.  5.  End-stage renal disease on hemodialysis Nephrology has been notified of patient's admission.  Patient underwent hemodialysis on 10/02/2018 and 10/05/2018.Marland Kitchen  Per nephrology.   6.  Shortness of breath Questionable etiology.  Could be secondary to problem #2 versus volume overload secondary to end-stage renal disease.  Bibasilar crackles noted on examination.  Patient improved clinically post hemodialysis.   7.  Hypertension Blood pressure remained stable on Imdur, Norvasc and low-dose beta-blocker which was started per cardiology in the hospitalization.  Patient also underwent hemodialysis during the hospitalization.  8.  Anemia Felt likely secondary to anemia of chronic disease.  Patient transfused 1 unit packed red blood cells during hemodialysis on admission.  Hemoglobin remained stable.  Outpatient follow-up.   Procedures:  CT  abdomen and pelvis 10/01/2018  Chest x-ray 10/01/2018  CT-guided drainage of right lower quadrant superficial and deep fluid collection a cement of 2 drains in the right lower quadrant per Dr.Shick 10/02/2018   Consultations:  IR: Dr. Annamaria Boots 10/02/2018  General surgery: Dr. Romana Juniper 10/01/2018  Nephrology: Dr.Schertz 10/02/2018  Cardiology: Dr. Domenic Polite 10/02/2018   Discharge Exam: Vitals:   10/05/18 1230 10/05/18 1245  BP: (!) 90/55 (!) 90/55  Pulse: (!) 55 62  Resp: 20 (!) 21  Temp:    SpO2:      General: NAD Cardiovascular: RRR Respiratory: CTAB  Discharge Instructions   Discharge Instructions    Diet - low sodium heart healthy   Complete by:  As directed    Increase activity slowly   Complete by:  As directed      Allergies as of 10/05/2018      Reactions   Iodinated Diagnostic Agents Swelling, Rash, Other (See Comments)   Other Reaction: burning to mouth, swelling of lips   Lipitor [atorvastatin] Other (See Comments)   Leg pain   Metoprolol Other (See Comments)   Headaches    Baclofen Other (See Comments)   Possibly stroke like symptoms      Medication List    TAKE these medications   acetaminophen 500 MG tablet Commonly known as:  TYLENOL Take 1,000 mg by mouth every 6 (six) hours as needed for moderate pain or headache.   albuterol 108 (90 Base)  MCG/ACT inhaler Commonly known as:  PROVENTIL HFA;VENTOLIN HFA Inhale 1-2 puffs into the lungs every 6 (six) hours as needed for wheezing or shortness of breath.   amLODipine 5 MG tablet Commonly known as:  NORVASC Take 1 tablet (5 mg total) by mouth daily.   aspirin EC 81 MG tablet Take 81 mg by mouth daily.   bisoprolol 5 MG tablet Commonly known as:  ZEBETA Take 1 tablet (5 mg total) by mouth daily. Start taking on:  October 06, 2018   calcium acetate 667 MG capsule Commonly known as:  PHOSLO Take 2,001 mg by mouth daily with supper.   clopidogrel 75 MG tablet Commonly known as:  Plavix Take 1  tablet (75 mg total) by mouth daily.   ezetimibe 10 MG tablet Commonly known as:  ZETIA Take 1 tablet (10 mg total) by mouth daily.   gentamicin cream 0.1 % Commonly known as:  GARAMYCIN Apply 1 application topically daily as needed (prior to accessing PD catheter).   HYDROcodone-acetaminophen 5-325 MG tablet Commonly known as:  NORCO/VICODIN Take 1 tablet by mouth every 6 (six) hours as needed for moderate pain.   isosorbide mononitrate 60 MG 24 hr tablet Commonly known as:  IMDUR Take 2 tablets (120 mg total) by mouth daily.   multivitamin Tabs tablet Take 1 tablet by mouth daily.   repaglinide 0.5 MG tablet Commonly known as:  PRANDIN Take 1 tablet (0.5 mg total) by mouth 2 (two) times daily before a meal.   rosuvastatin 10 MG tablet Commonly known as:  CRESTOR Take 1 tablet (10 mg total) by mouth daily at 6 PM. What changed:  when to take this      Allergies  Allergen Reactions  . Iodinated Diagnostic Agents Swelling, Rash and Other (See Comments)    Other Reaction: burning to mouth, swelling of lips  . Lipitor [Atorvastatin] Other (See Comments)    Leg pain  . Metoprolol Other (See Comments)    Headaches   . Baclofen Other (See Comments)    Possibly stroke like symptoms   Follow-up Information    Liliane Shi, PA-C Follow up on 10/15/2018.   Specialties:  Cardiology, Physician Assistant Why:  8:45 am for hospital follow up Contact information: [address] [place] [address] [phone]        Nolene Ebbs, MD. Schedule an appointment as soon as possible for a visit in 2 week(s).   Specialty:  Internal Medicine Contact information: [place] [address] [phone]        Fay Records, MD .   Specialty:  Cardiology Contact information: [place] [address] [phone]        Constance Haw, MD .   Specialty:  Cardiology Contact information: [address] [place] [address] [phone]        Demetrius Revel, MD Follow up on 10/06/2018.   Specialty:  Surgery Why:  F/U AT 2:15PM Contact information: [address] [place] [address] [phone]            The results of significant diagnostics from this hospitalization (including imaging, microbiology, ancillary and laboratory) are listed below for reference.    Significant Diagnostic Studies: Ct Abdomen Pelvis Wo Contrast  Result Date: 10/01/2018 CLINICAL DATA:  Shortness of breath.  Abdominal distention. EXAM: CT ABDOMEN AND PELVIS WITHOUT CONTRAST TECHNIQUE: Multidetector CT imaging of the abdomen and pelvis was performed  following the standard protocol without IV contrast. COMPARISON:  None. FINDINGS: Lower chest: Heart is enlarged. Coronary artery calcifications are present. Mitral valve annular calcifications are present. Atherosclerotic changes are noted in the descending aorta. Mild bibasilar airspace disease is present. No significant pleural or pericardial effusion is present. Hepatobiliary: No focal liver abnormality is seen. No gallstones, gallbladder wall thickening, or biliary dilatation. Pancreas: Unremarkable. No pancreatic ductal dilatation or surrounding inflammatory changes. Spleen: Normal in size without focal abnormality. Adrenals/Urinary Tract: Adrenal glands are normal bilaterally. 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 Ureters are within normal limits. The urinary bladder is collapsed. Stomach/Bowel: A stomach and duodenum are within normal limits. Proximal small bowel is unremarkable. There is focal inflammation of bowel loops of small bowel adjacent to a fluid collection along the anterior peritoneum on the right. The right anterior peritoneal fluid collection measures 12.9 x 6.5 x 14.6 cm. There is no obstruction. Terminal ileum is otherwise normal. The appendix is visualized and normal. The ascending and transverse colon is  mostly collapsed. Diverticular changes are present in the descending and sigmoid colon without focal inflammation. Vascular/Lymphatic: Extensive vascular calcifications are present. There is no aneurysm. No significant adenopathy is present. Reproductive: Prostate is unremarkable. Other: Peritoneal dialysis catheter is in place. The anterior wall fluid collection seen on the previous study is stable. This is just anterior to the new collection. Elder collection measures 8.6 x 5.5 x 7.8 cm. There is some inflammatory change the subcutaneous tissues inferiorly. Musculoskeletal: Vertebral body heights and alignment are maintained. No focal lytic or blastic lesions are present. IMPRESSION: 1. New ventral peritoneal fluid collection measures 12.9 x 6.5 x 14.6 cm. This is worrisome for a loculated fluid collection associated with dialysis or developing abscess related to bowel disease. It appears separate from the other anterior abdominal wall fluid collection. 2. The more remote anterior wall fluid collection is stable. 3. Inflammatory changes of bowel adjacent to the fluid collection, likely reflecting adhesions. There is no obstruction. This is in the region of the previous hernia. 4. Dialysis catheter. 5. Aortic Atherosclerosis (ICD10-I70.0). Extensive vascular calcifications are present without aneurysm. 6. Cystic renal disease of dialysis. 7. Cardiomegaly without failure. 8. Coronary artery disease. Electronically Signed   By: San Morelle M.D.   On: 10/01/2018 21:40   Dg Chest Port 1 View  Result Date: 10/01/2018 CLINICAL DATA:  Dyspnea for the past 2 hours. EXAM: PORTABLE CHEST 1 VIEW COMPARISON:  08/19/2018 FINDINGS: Stable cardiomegaly with aortic atherosclerosis. Dual lumen dialysis catheter terminates in the right atrium unchanged in position. Median sternotomy sutures and post surgical clips project over the cardiac silhouette. No overt pulmonary edema. Hazy appearance at the left lung base may  represent a small layering left effusion. No acute osseous abnormality. Vascular clips project over the superior mediastinum as well as a vascular stent projecting over the right upper thorax in the region of the subclavian artery. IMPRESSION: Stable cardiomegaly. Hazy opacity at the left lung base may represent a small layering left effusion, new since prior. No overt pulmonary edema. No other change of note. Electronically Signed   By: Ashley Royalty M.D.   On: 10/01/2018 20:56   Ct Image Guided Fluid Drain By Catheter  Result Date: 10/02/2018 INDICATION: Right lower quadrant superficial and deep fluid collections EXAM: CT GUIDED DRAINAGE OF RIGHT LOWER QUADRANT SUPERFICIAL AND DEEP FLUID COLLECTIONS MEDICATIONS: The patient is currently admitted to the hospital and receiving intravenous antibiotics. The antibiotics were administered within an appropriate time frame prior  to the initiation of the procedure. ANESTHESIA/SEDATION: 2.0 mg IV Versed 100 mcg IV Fentanyl Moderate Sedation Time:  13 The patient was continuously monitored during the procedure by the interventional radiology nurse under my direct supervision. COMPLICATIONS: None immediate. TECHNIQUE: Informed written consent was obtained from the patient after a thorough discussion of the procedural risks, benefits and alternatives. All questions were addressed. Maximal Sterile Barrier Technique was utilized including caps, mask, sterile gowns, sterile gloves, sterile drape, hand hygiene and skin antiseptic. A timeout was performed prior to the initiation of the procedure. PROCEDURE: Previous imaging reviewed. Patient positioned supine. Noncontrast localization CT performed. The right lower quadrant superficial and deep abdominal wall fluid collections were localized. Overlying skin marked for drainage of both collections. Under sterile conditions and local anesthesia, 18 gauge 10 cm access needle were advanced into both the superficial and deep  collections. Needle positions confirmed with CT. Syringe aspiration yielded thin dark fluid from both collections suspicious for chronic hematomas. Amplatz guidewire inserted followed by tract dilatation to insert 2 10 French drains. Both drain catheter positions confirmed with CT. Catheters secured with Prolene sutures and connected to external suction bulb. Patient tolerated the procedure well. No immediate complication. Sterile dressings applied. FINDINGS: Imaging confirms needle placement into the right lower quadrant superficial and deep fluid collections for drain placements IMPRESSION: Successful CT-guided drain placements (2) within the right lower quadrant superficial and deep abdominal wall fluid collections. Electronically Signed   By: Jerilynn Mages.  Shick M.D.   On: 10/02/2018 14:15   Ct Image Guided Fluid Drain By Catheter  Result Date: 10/02/2018 INDICATION: Right lower quadrant superficial and deep fluid collections EXAM: CT GUIDED DRAINAGE OF RIGHT LOWER QUADRANT SUPERFICIAL AND DEEP FLUID COLLECTIONS MEDICATIONS: The patient is currently admitted to the hospital and receiving intravenous antibiotics. The antibiotics were administered within an appropriate time frame prior to the initiation of the procedure. ANESTHESIA/SEDATION: 2.0 mg IV Versed 100 mcg IV Fentanyl Moderate Sedation Time:  13 The patient was continuously monitored during the procedure by the interventional radiology nurse under my direct supervision. COMPLICATIONS: None immediate. TECHNIQUE: Informed written consent was obtained from the patient after a thorough discussion of the procedural risks, benefits and alternatives. All questions were addressed. Maximal Sterile Barrier Technique was utilized including caps, mask, sterile gowns, sterile gloves, sterile drape, hand hygiene and skin antiseptic. A timeout was performed prior to the initiation of the procedure. PROCEDURE: Previous imaging reviewed. Patient positioned supine. Noncontrast  localization CT performed. The right lower quadrant superficial and deep abdominal wall fluid collections were localized. Overlying skin marked for drainage of both collections. Under sterile conditions and local anesthesia, 18 gauge 10 cm access needle were advanced into both the superficial and deep collections. Needle positions confirmed with CT. Syringe aspiration yielded thin dark fluid from both collections suspicious for chronic hematomas. Amplatz guidewire inserted followed by tract dilatation to insert 2 10 French drains. Both drain catheter positions confirmed with CT. Catheters secured with Prolene sutures and connected to external suction bulb. Patient tolerated the procedure well. No immediate complication. Sterile dressings applied. FINDINGS: Imaging confirms needle placement into the right lower quadrant superficial and deep fluid collections for drain placements IMPRESSION: Successful CT-guided drain placements (2) within the right lower quadrant superficial and deep abdominal wall fluid collections. Electronically Signed   By: Jerilynn Mages.  Shick M.D.   On: 10/02/2018 14:15    Microbiology: Recent Results (from the past 240 hour(s))  Culture, blood (routine x 2)     Status: None (Preliminary  result)   Collection Time: 10/02/18 12:25 AM  Result Value Ref Range Status   Specimen Description BLOOD LEFT ARM  Final   Special Requests   Final    BOTTLES DRAWN AEROBIC AND ANAEROBIC Blood Culture adequate volume   Culture   Final    NO GROWTH 3 DAYS Performed at [facility], [address] [place], [place] [address]    Report Status PENDING  Incomplete  Culture, blood (routine x 2)     Status: None (Preliminary result)   Collection Time: 10/02/18 12:40 AM  Result Value Ref Range Status   Specimen Description BLOOD LEFT HAND  Final   Special Requests   Final    BOTTLES DRAWN AEROBIC ONLY Blood Culture results may not be optimal due to an excessive volume of blood received in culture  bottles   Culture   Final    NO GROWTH 3 DAYS Performed at [facility], [place] [address], [place], [place] [address]    Report Status PENDING  Incomplete  MRSA PCR Screening     Status: None   Collection Time: 10/02/18  7:25 AM  Result Value Ref Range Status   MRSA by PCR NEGATIVE NEGATIVE Final    Comment:        The GeneXpert MRSA Assay (FDA approved for NASAL specimens only), is one component of a comprehensive MRSA colonization surveillance program. It is not intended to diagnose MRSA infection nor to guide or monitor treatment for MRSA infections. Performed at [facility], [place] [address], [place], [place] [address]   Aerobic/Anaerobic Culture (surgical/deep wound)     Status: None (Preliminary result)   Collection Time: 10/02/18  2:26 PM  Result Value Ref Range Status   Specimen Description WOUND ABDOMEN  Final   Special Requests Normal  Final   Gram Stain   Final    RARE WBC PRESENT, PREDOMINANTLY MONONUCLEAR NO ORGANISMS SEEN    Culture   Final    NO GROWTH 3 DAYS NO ANAEROBES ISOLATED; CULTURE IN PROGRESS FOR 5 DAYS Performed at [facility], [place] [address], [place], [place] [address]    Report Status PENDING  Incomplete  Aerobic/Anaerobic Culture (surgical/deep wound)     Status: None (Preliminary result)   Collection Time: 10/02/18  2:26 PM  Result Value Ref Range Status   Specimen Description WOUND ABDOMEN  Final   Special Requests Normal  Final   Gram Stain   Final    FEW WBC PRESENT, PREDOMINANTLY MONONUCLEAR NO ORGANISMS SEEN    Culture   Final    NO GROWTH 3 DAYS NO ANAEROBES ISOLATED; CULTURE IN PROGRESS FOR 5 DAYS Performed at [facility], [place] [address] [place], [place] [address]    Report Status PENDING  Incomplete     Labs: Basic Metabolic Panel: Recent Labs  Lab 10/01/18 1927 10/02/18 0702 10/03/18 0351 10/04/18 0238 10/05/18 0947  NA 139 139 136  134* 137 139  K 3.4* 3.8 3.6  3.6 3.4* 3.5  CL 103 103 100   99 100 100  CO2 23 23 24  25 26 22   GLUCOSE 101* 87 127*  125* 138* 163*  BUN 29* 33* 19  19 35* 49*  CREATININE 10.09* 11.13* 7.37*  7.25* 10.69* 14.47*  CALCIUM 9.7 8.9 9.1  9.1 8.6* 8.0*  PHOS  --   --  3.7  3.7 4.3 5.5*   Liver Function Tests: Recent Labs  Lab 10/01/18 1927  10/03/18 0351 10/04/18 0238 10/05/18 0947  AST 20  --   --   --   ALT 11  --   --   --   ALKPHOS 70  --   --   --   BILITOT 0.7  --   --   --   PROT 7.1  --   --   --   ALBUMIN 3.3* 2.9*  2.9* 2.9* 3.0*   Recent Labs  Lab 10/01/18 1927  LIPASE 41   No results for input(s): AMMONIA in the last 168 hours. CBC: Recent Labs  Lab 10/01/18 1927 10/02/18 0702 10/03/18 0351 10/04/18 0238 10/05/18 0946  WBC 6.9 5.8 5.4  5.2 6.0 6.4  NEUTROABS  --   --  2.8 3.0 3.5  HGB 9.0* 7.7* 9.4*  9.4* 9.2* 9.1*  HCT 31.1* 25.4* 30.2*  31.1* 30.7* 30.8*  MCV 91.5 89.4 88.3  88.4 88.2 88.8  PLT 195 175 175  182 171 177   Cardiac Enzymes: Recent Labs  Lab 10/02/18 0039 10/02/18 0702 10/03/18 0351  TROPONINI 0.32* 0.43* 0.29*   BNP: BNP (last 3 results) Recent Labs    10/02/18 0039  BNP 3,561.2*    ProBNP (last 3 results) No results for input(s): PROBNP in the last 8760 hours.  CBG: Recent Labs  Lab 10/02/18 0834 10/02/18 1853 10/02/18 2049 10/04/18 2126  GLUCAP 77 62* 96 104*       Signed:  Irine Seal MD.  [facility] 10/05/2018, 3:19 PM

## 2018-10-05 NOTE — Progress Notes (Signed)
[facility] Progress Note   Assessment/ Plan:   TTS [facility] 4h 2K/3.5 bath 400/800 101.5kg TDC LIJ Hep 3000   Assessment: 1. Abd pain/ fluid collections x2 by CT in region of recently placed PD cath - ?infection vs seroma, sp drain placement x 2 3/7 with IR.  Cx's negative x 2 days, antibiotics stopped.  Possible that drains will be removed today pending IR evaluation.  Pt has f/u appointment 3/11 at 2:15 pm with Dr. Raul Del.  Have d/w HT unit as well.      2. ESRD - on HD TTS. SP recent PD cath 09/13/18 Dr Raul Del along w/ hernia repair w/ mesh.  Will continue HD here, next rx tdaoy 3/10. PD cath flush yesterday.  Will not start PD training until we've sorted out these fluid collections- d/w HT.   3. SOB/ vol overload - losing body wt- will need EDW adjustment upon d/c  4. Hx failed renal Tx- off all IS  5. HTN on norvasc/ bisoprolol, BP's good  6. CAD hx MI  7. DM2 not on insulin  8. Anemia of CKD: Hgb 9.1, s/p 1 u pRBCs, not due for ESA yet (will confirm OP records).  9.  Dispo: possibly today pending drain plan  Subjective:    Seen on dialysis.  Afebrile.  PD flush without incident yesterday.  Drains with minimal output- 47 mL charted (collectively) yesterday.     Objective:   BP 128/65   Pulse (!) 57   Temp (!) 96.9 F (36.1 C) (Oral)   Resp 18   Ht 5' 6.5" (1.689 m)   Wt 98.6 kg   SpO2 99%   BMI 34.56 kg/m   Physical Exam: Gen: obese, NAD CVS: RRR Resp: clear Abd: PD cath in LLQ, dressed.  Port sites with steri-strips- no drainage/erythema.  2 JPs RLQ with minimal serosanguinous output. Ext: 1+ LE edema ACCESS: PD cath, dressed, and L IJ [facility]  Labs: BMET Recent Labs  Lab 10/01/18 1927 10/02/18 0702 10/03/18 0351 10/04/18 0238 10/05/18 0947  NA 139 139 136  134* 137 139  K 3.4* 3.8 3.6  3.6 3.4* 3.5  CL 103 103 100  99 100 100  CO2 23 23 24  25 26 22   GLUCOSE 101* 87 127*  125* 138* 163*  BUN 29* 33* 19  19 35* 49*   CREATININE 10.09* 11.13* 7.37*  7.25* 10.69* 14.47*  CALCIUM 9.7 8.9 9.1  9.1 8.6* 8.0*  PHOS  --   --  3.7  3.7 4.3 5.5*   CBC Recent Labs  Lab 10/02/18 0702 10/03/18 0351 10/04/18 0238 10/05/18 0946  WBC 5.8 5.4  5.2 6.0 6.4  NEUTROABS  --  2.8 3.0 3.5  HGB 7.7* 9.4*  9.4* 9.2* 9.1*  HCT 25.4* 30.2*  31.1* 30.7* 30.8*  MCV 89.4 88.3  88.4 88.2 88.8  PLT 175 175  182 171 177    @IMGRELPRIORS @ Medications:    . amLODipine  5 mg Oral Daily  . bisoprolol  5 mg Oral Daily  . calcium acetate  2,001 mg Oral Q supper  . Chlorhexidine Gluconate Cloth  6 each Topical Q0600  . diphenhydrAMINE  25 mg Oral Once  . ezetimibe  10 mg Oral Daily  . feeding supplement (NEPRO CARB STEADY)  237 mL Oral Q1500  . heparin  3,000 Units Dialysis Once in dialysis  . isosorbide mononitrate  120 mg Oral Daily  . multivitamin  1 tablet Oral Daily  . pneumococcal  23 valent vaccine  0.5 mL Intramuscular Tomorrow-1000  . potassium chloride  20 mEq Oral Once  . repaglinide  0.5 mg Oral BID AC  . rosuvastatin  10 mg Oral Daily  . sodium chloride flush  5 mL Intracatheter Q8H     Madelon Lips MD [facility] pgr [phone] 10/05/2018, 10:56 AM

## 2018-10-05 NOTE — Procedures (Signed)
Patient seen and examined on Hemodialysis. BP 133/70   Pulse (!) 54   Temp (!) 96.9 F (36.1 C) (Oral)   Resp 18   Ht 5' 6.5" (1.689 m)   Wt 98.6 kg   SpO2 99%   BMI 34.56 kg/m   QB 400 mL/ min via R IJ TDC UF goal 3.5L    Tolerating treatment without complaints at this time.   Madelon Lips MD [facility] pgr [phone] 11:00 AM

## 2018-10-05 NOTE — Progress Notes (Signed)
Patient refused AM labs. Will like it drawn in dialysis.

## 2018-10-05 NOTE — Progress Notes (Signed)
Referring Physician(s): Dr. Jennette Kettle  Supervising Physician: Jacqulynn Cadet  Patient Status:  [facility] - In-pt  Chief Complaint: Follow up abdominal drain placement x2 on 3/7 by Dr. Annamaria Boots.  Subjective:  Patient laying in bed talking on the phone. Denies any complaints, happy to have his drains removed.   Allergies: Iodinated diagnostic agents; Lipitor [atorvastatin]; Metoprolol; and Baclofen  Medications: Prior to Admission medications   Medication Sig Start Date End Date Taking? Authorizing Provider  acetaminophen (TYLENOL) 500 MG tablet Take 1,000 mg by mouth every 6 (six) hours as needed for moderate pain or headache.   Yes [provider]  albuterol (PROVENTIL HFA;VENTOLIN HFA) 108 (90 Base) MCG/ACT inhaler Inhale 1-2 puffs into the lungs every 6 (six) hours as needed for wheezing or shortness of breath. 12/18/17  Yes Weaver, Scott T, PA-C  amLODipine (NORVASC) 5 MG tablet Take 1 tablet (5 mg total) by mouth daily. 11/27/17 08/19/26 Yes Richardson Dopp T, PA-C  aspirin EC 81 MG tablet Take 81 mg by mouth daily.   Yes [provider]  calcium acetate (PHOSLO) 667 MG capsule Take 2,001 mg by mouth daily with supper.    Yes [provider]  clopidogrel (PLAVIX) 75 MG tablet Take 1 tablet (75 mg total) by mouth daily. 12/18/17  Yes Weaver, Scott T, PA-C  ezetimibe (ZETIA) 10 MG tablet Take 1 tablet (10 mg total) by mouth daily. 04/02/18 08/19/26 Yes Weaver, Scott T, PA-C  gentamicin cream (GARAMYCIN) 0.1 % Apply 1 application topically daily as needed (prior to accessing PD catheter).  09/28/18  Yes [provider]  HYDROcodone-acetaminophen (NORCO/VICODIN) 5-325 MG tablet Take 1 tablet by mouth every 6 (six) hours as needed for moderate pain.   Yes [provider]  isosorbide mononitrate (IMDUR) 60 MG 24 hr tablet Take 2 tablets (120 mg total) by mouth daily. 08/21/18  Yes Hongalgi, Lenis Dickinson, MD  multivitamin (RENA-VIT) TABS tablet Take 1 tablet by  mouth daily.   Yes [provider]  repaglinide (PRANDIN) 0.5 MG tablet Take 1 tablet (0.5 mg total) by mouth 2 (two) times daily before a meal. 04/05/18  Yes Renato Shin, MD  rosuvastatin (CRESTOR) 10 MG tablet Take 1 tablet (10 mg total) by mouth daily at 6 PM. Patient taking differently: Take 10 mg by mouth daily.  08/21/18  Yes Modena Jansky, MD     Vital Signs: BP (!) 90/55   Pulse 62   Temp (!) 96.9 F (36.1 C) (Oral)   Resp (!) 21   Ht 5' 6.5" (1.689 m)   Wt 217 lb 6 oz (98.6 kg)   SpO2 99%   BMI 34.56 kg/m   Physical Exam Vitals signs and nursing note reviewed.  Constitutional:      General: He is not in acute distress.    Appearance: He is obese.  HENT:     Head: Normocephalic.  Cardiovascular:     Rate and Rhythm: Normal rate.  Pulmonary:     Effort: Pulmonary effort is normal.  Abdominal:     General: There is no distension.     Palpations: Abdomen is soft.     Comments: (+) right sided abdominal drains x2 both to JP with serosanguineous OP. Insertion sites clean, dry, dressed appropriately. No erythema, edema, leakage or bleeding.   Skin:    General: Skin is warm and dry.  Neurological:     Mental Status: He is alert. Mental status is at baseline.     Imaging:  Ct Abdomen Pelvis Wo Contrast  Result Date: 10/01/2018 CLINICAL DATA:  Shortness of breath.  Abdominal distention. EXAM: CT ABDOMEN AND PELVIS WITHOUT CONTRAST TECHNIQUE: Multidetector CT imaging of the abdomen and pelvis was performed following the standard protocol without IV contrast. COMPARISON:  None. FINDINGS: Lower chest: Heart is enlarged. Coronary artery calcifications are present. Mitral valve annular calcifications are present. Atherosclerotic changes are noted in the descending aorta. Mild bibasilar airspace disease is present. No significant pleural or pericardial effusion is present. Hepatobiliary: No focal liver abnormality is seen. No gallstones, gallbladder wall thickening, or  biliary dilatation. Pancreas: Unremarkable. No pancreatic ductal dilatation or surrounding inflammatory changes. Spleen: Normal in size without focal abnormality. Adrenals/Urinary Tract: Adrenal glands are normal bilaterally. 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 Ureters are within normal limits. The urinary bladder is collapsed. Stomach/Bowel: A stomach and duodenum are within normal limits. Proximal small bowel is unremarkable. There is focal inflammation of bowel loops of small bowel adjacent to a fluid collection along the anterior peritoneum on the right. The right anterior peritoneal fluid collection measures 12.9 x 6.5 x 14.6 cm. There is no obstruction. Terminal ileum is otherwise normal. The appendix is visualized and normal. The ascending and transverse colon is mostly collapsed. Diverticular changes are present in the descending and sigmoid colon without focal inflammation. Vascular/Lymphatic: Extensive vascular calcifications are present. There is no aneurysm. No significant adenopathy is present. Reproductive: Prostate is unremarkable. Other: Peritoneal dialysis catheter is in place. The anterior wall fluid collection seen on the previous study is stable. This is just anterior to the new collection. Elder collection measures 8.6 x 5.5 x 7.8 cm. There is some inflammatory change the subcutaneous tissues inferiorly. Musculoskeletal: Vertebral body heights and alignment are maintained. No focal lytic or blastic lesions are present. IMPRESSION: 1. New ventral peritoneal fluid collection measures 12.9 x 6.5 x 14.6 cm. This is worrisome for a loculated fluid collection associated with dialysis or developing abscess related to bowel disease. It appears separate from the other anterior abdominal wall fluid collection. 2. The more remote anterior wall fluid collection is stable. 3. Inflammatory changes of bowel adjacent to the fluid collection, likely reflecting adhesions. There is no obstruction. This is  in the region of the previous hernia. 4. Dialysis catheter. 5. Aortic Atherosclerosis (ICD10-I70.0). Extensive vascular calcifications are present without aneurysm. 6. Cystic renal disease of dialysis. 7. Cardiomegaly without failure. 8. Coronary artery disease. Electronically Signed   By: San Morelle M.D.   On: 10/01/2018 21:40   Dg Chest Port 1 View  Result Date: 10/01/2018 CLINICAL DATA:  Dyspnea for the past 2 hours. EXAM: PORTABLE CHEST 1 VIEW COMPARISON:  08/19/2018 FINDINGS: Stable cardiomegaly with aortic atherosclerosis. Dual lumen dialysis catheter terminates in the right atrium unchanged in position. Median sternotomy sutures and post surgical clips project over the cardiac silhouette. No overt pulmonary edema. Hazy appearance at the left lung base may represent a small layering left effusion. No acute osseous abnormality. Vascular clips project over the superior mediastinum as well as a vascular stent projecting over the right upper thorax in the region of the subclavian artery. IMPRESSION: Stable cardiomegaly. Hazy opacity at the left lung base may represent a small layering left effusion, new since prior. No overt pulmonary edema. No other change of note. Electronically Signed   By: Ashley Royalty M.D.   On: 10/01/2018 20:56   Ct Image Guided Fluid Drain By Catheter  Result Date: 10/02/2018 INDICATION: Right lower quadrant superficial and deep fluid collections  EXAM: CT GUIDED DRAINAGE OF RIGHT LOWER QUADRANT SUPERFICIAL AND DEEP FLUID COLLECTIONS MEDICATIONS: The patient is currently admitted to the hospital and receiving intravenous antibiotics. The antibiotics were administered within an appropriate time frame prior to the initiation of the procedure. ANESTHESIA/SEDATION: 2.0 mg IV Versed 100 mcg IV Fentanyl Moderate Sedation Time:  13 The patient was continuously monitored during the procedure by the interventional radiology nurse under my direct supervision. COMPLICATIONS: None  immediate. TECHNIQUE: Informed written consent was obtained from the patient after a thorough discussion of the procedural risks, benefits and alternatives. All questions were addressed. Maximal Sterile Barrier Technique was utilized including caps, mask, sterile gowns, sterile gloves, sterile drape, hand hygiene and skin antiseptic. A timeout was performed prior to the initiation of the procedure. PROCEDURE: Previous imaging reviewed. Patient positioned supine. Noncontrast localization CT performed. The right lower quadrant superficial and deep abdominal wall fluid collections were localized. Overlying skin marked for drainage of both collections. Under sterile conditions and local anesthesia, 18 gauge 10 cm access needle were advanced into both the superficial and deep collections. Needle positions confirmed with CT. Syringe aspiration yielded thin dark fluid from both collections suspicious for chronic hematomas. Amplatz guidewire inserted followed by tract dilatation to insert 2 10 French drains. Both drain catheter positions confirmed with CT. Catheters secured with Prolene sutures and connected to external suction bulb. Patient tolerated the procedure well. No immediate complication. Sterile dressings applied. FINDINGS: Imaging confirms needle placement into the right lower quadrant superficial and deep fluid collections for drain placements IMPRESSION: Successful CT-guided drain placements (2) within the right lower quadrant superficial and deep abdominal wall fluid collections. Electronically Signed   By: Jerilynn Mages.  Shick M.D.   On: 10/02/2018 14:15   Ct Image Guided Fluid Drain By Catheter  Result Date: 10/02/2018 INDICATION: Right lower quadrant superficial and deep fluid collections EXAM: CT GUIDED DRAINAGE OF RIGHT LOWER QUADRANT SUPERFICIAL AND DEEP FLUID COLLECTIONS MEDICATIONS: The patient is currently admitted to the hospital and receiving intravenous antibiotics. The antibiotics were administered within  an appropriate time frame prior to the initiation of the procedure. ANESTHESIA/SEDATION: 2.0 mg IV Versed 100 mcg IV Fentanyl Moderate Sedation Time:  13 The patient was continuously monitored during the procedure by the interventional radiology nurse under my direct supervision. COMPLICATIONS: None immediate. TECHNIQUE: Informed written consent was obtained from the patient after a thorough discussion of the procedural risks, benefits and alternatives. All questions were addressed. Maximal Sterile Barrier Technique was utilized including caps, mask, sterile gowns, sterile gloves, sterile drape, hand hygiene and skin antiseptic. A timeout was performed prior to the initiation of the procedure. PROCEDURE: Previous imaging reviewed. Patient positioned supine. Noncontrast localization CT performed. The right lower quadrant superficial and deep abdominal wall fluid collections were localized. Overlying skin marked for drainage of both collections. Under sterile conditions and local anesthesia, 18 gauge 10 cm access needle were advanced into both the superficial and deep collections. Needle positions confirmed with CT. Syringe aspiration yielded thin dark fluid from both collections suspicious for chronic hematomas. Amplatz guidewire inserted followed by tract dilatation to insert 2 10 French drains. Both drain catheter positions confirmed with CT. Catheters secured with Prolene sutures and connected to external suction bulb. Patient tolerated the procedure well. No immediate complication. Sterile dressings applied. FINDINGS: Imaging confirms needle placement into the right lower quadrant superficial and deep fluid collections for drain placements IMPRESSION: Successful CT-guided drain placements (2) within the right lower quadrant superficial and deep abdominal wall fluid collections. Electronically Signed  By: Eugenie Filler M.D.   On: 10/02/2018 14:15    Labs:  CBC: Recent Labs    10/02/18 0702 10/03/18 0351  10/04/18 0238 10/05/18 0946  WBC 5.8 5.4  5.2 6.0 6.4  HGB 7.7* 9.4*  9.4* 9.2* 9.1*  HCT 25.4* 30.2*  31.1* 30.7* 30.8*  PLT 175 175  182 171 177    COAGS: Recent Labs    08/19/18 1716  INR 1.06  APTT 34    BMP: Recent Labs    10/02/18 0702 10/03/18 0351 10/04/18 0238 10/05/18 0947  NA 139 136  134* 137 139  K 3.8 3.6  3.6 3.4* 3.5  CL 103 100  99 100 100  CO2 23 24  25 26 22   GLUCOSE 87 127*  125* 138* 163*  BUN 33* 19  19 35* 49*  CALCIUM 8.9 9.1  9.1 8.6* 8.0*  CREATININE 11.13* 7.37*  7.25* 10.69* 14.47*  GFRNONAA 4* 7*  7* 5* 3*  GFRAA 5* 8*  8* 5* 4*    LIVER FUNCTION TESTS: Recent Labs    05/11/18 1735  07/02/18 0735 08/19/18 1716  10/01/18 1927 10/03/18 0351 10/04/18 0238 10/05/18 0947  BILITOT 1.0  --  0.3 0.5  --  0.7  --   --   --   AST 34  --  14 19  --  20  --   --   --   ALT 12  --  8 10  --  11  --   --   --   ALKPHOS 53  --  173* 63  --  70  --   --   --   PROT 7.3  --  7.3 8.6*  --  7.1  --   --   --   ALBUMIN 3.4*   < > 4.5 4.1   < > 3.3* 2.9*  2.9* 2.9* 3.0*   < > = values in this interval not displayed.    Assessment and Plan:  63 y/o M s/p abdominal drain placement x2 in IR on 3/7 for superficial and deep abdominal wall fluid collections - fluid consistent with old hematomas, preliminary cxs negative. OP from drains ~20 cc Q24H each.  As previously discussed with Dr. Earleen Newport ok for drains to be removed given stable output.  Both drains were removed at bedside today by myself without complication. Insertion sites were cleaned and dressed with gauze and tegaderm. Patient instructed to keep clean, dry and dressed for 24-36H or until insertion sites are healed.   Further plans per primary team. Please call IR with questions or concerns.  Electronically Signed: Joaquim Nam, PA-C 10/05/2018, 2:20 PM   I spent a total of 25 Minutes at the the patient's bedside AND on the patient's hospital floor or unit, greater  than 50% of which was counseling/coordinating care for abdominal drains x2 follow up and removal.

## 2018-10-07 DIAGNOSIS — N2581 Secondary hyperparathyroidism of renal origin: Secondary | ICD-10-CM | POA: Diagnosis not present

## 2018-10-07 DIAGNOSIS — N186 End stage renal disease: Secondary | ICD-10-CM | POA: Diagnosis not present

## 2018-10-07 DIAGNOSIS — D631 Anemia in chronic kidney disease: Secondary | ICD-10-CM | POA: Diagnosis not present

## 2018-10-07 DIAGNOSIS — J14 Pneumonia due to Hemophilus influenzae: Secondary | ICD-10-CM | POA: Diagnosis not present

## 2018-10-07 DIAGNOSIS — R82998 Other abnormal findings in urine: Secondary | ICD-10-CM | POA: Diagnosis not present

## 2018-10-07 DIAGNOSIS — D509 Iron deficiency anemia, unspecified: Secondary | ICD-10-CM | POA: Diagnosis not present

## 2018-10-07 DIAGNOSIS — N2589 Other disorders resulting from impaired renal tubular function: Secondary | ICD-10-CM | POA: Diagnosis not present

## 2018-10-07 DIAGNOSIS — K769 Liver disease, unspecified: Secondary | ICD-10-CM | POA: Diagnosis not present

## 2018-10-07 LAB — AEROBIC/ANAEROBIC CULTURE (SURGICAL/DEEP WOUND): CULTURE: NO GROWTH

## 2018-10-07 LAB — AEROBIC/ANAEROBIC CULTURE W GRAM STAIN (SURGICAL/DEEP WOUND)
Culture: NO GROWTH
Special Requests: NORMAL
Special Requests: NORMAL

## 2018-10-07 LAB — CULTURE, BLOOD (ROUTINE X 2)
Culture: NO GROWTH
Culture: NO GROWTH
Special Requests: ADEQUATE

## 2018-10-08 DIAGNOSIS — N2589 Other disorders resulting from impaired renal tubular function: Secondary | ICD-10-CM | POA: Diagnosis not present

## 2018-10-08 DIAGNOSIS — D509 Iron deficiency anemia, unspecified: Secondary | ICD-10-CM | POA: Diagnosis not present

## 2018-10-08 DIAGNOSIS — N2581 Secondary hyperparathyroidism of renal origin: Secondary | ICD-10-CM | POA: Diagnosis not present

## 2018-10-08 DIAGNOSIS — D631 Anemia in chronic kidney disease: Secondary | ICD-10-CM | POA: Diagnosis not present

## 2018-10-08 DIAGNOSIS — N186 End stage renal disease: Secondary | ICD-10-CM | POA: Diagnosis not present

## 2018-10-08 DIAGNOSIS — K769 Liver disease, unspecified: Secondary | ICD-10-CM | POA: Diagnosis not present

## 2018-10-11 DIAGNOSIS — N186 End stage renal disease: Secondary | ICD-10-CM | POA: Diagnosis not present

## 2018-10-11 DIAGNOSIS — D509 Iron deficiency anemia, unspecified: Secondary | ICD-10-CM | POA: Diagnosis not present

## 2018-10-11 DIAGNOSIS — K769 Liver disease, unspecified: Secondary | ICD-10-CM | POA: Diagnosis not present

## 2018-10-11 DIAGNOSIS — N2589 Other disorders resulting from impaired renal tubular function: Secondary | ICD-10-CM | POA: Diagnosis not present

## 2018-10-11 DIAGNOSIS — D631 Anemia in chronic kidney disease: Secondary | ICD-10-CM | POA: Diagnosis not present

## 2018-10-11 DIAGNOSIS — N2581 Secondary hyperparathyroidism of renal origin: Secondary | ICD-10-CM | POA: Diagnosis not present

## 2018-10-12 DIAGNOSIS — J14 Pneumonia due to Hemophilus influenzae: Secondary | ICD-10-CM | POA: Diagnosis not present

## 2018-10-12 DIAGNOSIS — D631 Anemia in chronic kidney disease: Secondary | ICD-10-CM | POA: Diagnosis not present

## 2018-10-12 DIAGNOSIS — N186 End stage renal disease: Secondary | ICD-10-CM | POA: Diagnosis not present

## 2018-10-12 DIAGNOSIS — N2581 Secondary hyperparathyroidism of renal origin: Secondary | ICD-10-CM | POA: Diagnosis not present

## 2018-10-13 DIAGNOSIS — N2581 Secondary hyperparathyroidism of renal origin: Secondary | ICD-10-CM | POA: Diagnosis not present

## 2018-10-13 DIAGNOSIS — D631 Anemia in chronic kidney disease: Secondary | ICD-10-CM | POA: Diagnosis not present

## 2018-10-13 DIAGNOSIS — N186 End stage renal disease: Secondary | ICD-10-CM | POA: Diagnosis not present

## 2018-10-13 DIAGNOSIS — J14 Pneumonia due to Hemophilus influenzae: Secondary | ICD-10-CM | POA: Diagnosis not present

## 2018-10-14 DIAGNOSIS — N2581 Secondary hyperparathyroidism of renal origin: Secondary | ICD-10-CM | POA: Diagnosis not present

## 2018-10-14 DIAGNOSIS — J14 Pneumonia due to Hemophilus influenzae: Secondary | ICD-10-CM | POA: Diagnosis not present

## 2018-10-14 DIAGNOSIS — N186 End stage renal disease: Secondary | ICD-10-CM | POA: Diagnosis not present

## 2018-10-14 DIAGNOSIS — D631 Anemia in chronic kidney disease: Secondary | ICD-10-CM | POA: Diagnosis not present

## 2018-10-14 NOTE — Progress Notes (Deleted)
Cardiology Office Note:       Tele-Health Visit     Date:  10/14/2018   ID:  Patrick [name], DOB [birth year], MRN [id number]  PCP:  Nolene Ebbs, MD  Cardiologist:  Dorris Carnes, MD *** Electrophysiologist:  Will Meredith Leeds, MD   Referring MD: Nolene Ebbs, MD   No chief complaint on file. ***  History of Present Illness:    Patrick [name] is a 63 y.o. 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  In 2007, he developed reduced LV function with EF 30% and high output heart failure attributed to simultaneous left and right AV fistulas for dialysis. His right AV fistula was closed and he had resolution of symptoms and recovery of LV function. Cardiac Catheterization in 11/2017 demonstrated an atretic LIMA-LAD and an occluded LAD with left to left and left to right collaterals. He had moderate nonobstructive disease elsewhere. It was felt that his angina was related to his occluded LAD. CTO intervention would be very difficult and therefore he is not an ideal candidate. Continued medical therapy was recommended.  He was last seen in Sept 2019.  He was seen by Cardiology during an admission in 07/2018 for chest pain.  His Imdur was increased to 120 mg once daily.  He was admitted again 3/6-3/10 with abdominal pain.  Abd CT was concerning for abscess vs seroma.  He was covered with IV antibiotics.  He was seen by Cardiology for chest pain and minimal Troponin elevation.  His Troponin elevation was felt to be due to demand ischemia.    ***  Patrick [name] ***  Prior CV studies:   The following studies were reviewed today:  *** Carotid US 08/20/2018 Bilat ICA 1-39  Echo 08/20/2018 Mild LVH, EF 50-55, no RWMA, Gr 1 DD, mild AS (mean 11), severe MAC  Cardiac  Catheterization5/10/19 LAD proximal 100 (left to left and right-to-left collaterals); D1 ostial 50 OM1 50 RCA proximal 50, mid 60 LIMA-LAD atretic Medical therapy;approach to LAD makes CTO intervention difficult  Nuclear stress test 09/02/17 EF 53, anteroseptal/apical anterior/apical septal/apical lateral/apical scar, no ischemia  Echo 01/25/16 Mild concentric LVH, EF 45-50, diffuse HK, grade 2 diastolic dysfunction, very mild aortic stenosis (mean 13, peak 26), MAC, mild MR, mild LAE  Nuclear stress test 01/08/15(Dr. Einar Gip) EF 55, very small area of ischemia in the basal inferior, inferoapical and apical septal regions; low risk  Echo 11/29/13 Mild LVH, EF 52-77, grade 1 diastolic dysfunction, MAC, mild LAE  Nuclear stress test 10/10/13 ([facility]) FINAL COMMENTS - Moderate apical and periapical (i.e. apex, apical anterior/ lateral/ inferior segments) ischemia status post regadenoson stress. - LVEF= 63%. Septal dyskinesis in a post- CABG pattern. Otherwise normal systolic function. - Mild RV uptake, suggestive of RV hypertrophy  Past Medical History:  Diagnosis Date  . Arthritis   . Atrial flutter ([place])    Ablation 2018  . 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 Erectile dysfunction   . ESRD (end stage renal disease) on dialysis ([place])    Pt on HD 2003 >> transplanted in 2009, back on HD in 2016. [name].   Marland Kitchen  Essential hypertension   . Gout   . Hernia of abdominal wall   . History of blood transfusion   . History of cardiomyopathy   . History of kidney stones   . History of pneumonia   . Migraine   . Myocardial infarction ([place])    1996  . Secondary hyperparathyroidism ([place])   . Type 2 diabetes mellitus ([place])   . Wears glasses    Surgical Hx: The patient  has a past surgical history that  includes Kidney transplant (2009); Appendectomy; AV fistula placement (Right, 09/18/2014); ir generic historical (05/11/2016); ir generic historical (05/11/2016); ir generic historical (05/11/2016); ir generic historical (05/11/2016); Cardiac catheterization (N/A, 06/04/2016); AV fistula placement (Left, 07/07/2016); Hernia repair (2017); Nephrectomy (2017); Cardiac catheterization (~ 2016); A-FLUTTER ABLATION (N/A, 09/24/2016); Coronary artery bypass graft (1997); ABDOMINAL AORTOGRAM W/LOWER EXTREMITY (N/A, 04/13/2017); PERIPHERAL VASCULAR INTERVENTION (04/13/2017); Thrombectomy w/ embolectomy (Left, 09/14/2017); Thrombectomy and revision of arterioventous (av) goretex  graft (Left, 10/08/2017); Insertion of dialysis catheter (N/A, 10/08/2017); UPPER EXTREMITY VENOGRAPHY (N/A, 11/16/2017); LEFT HEART CATH AND CORS/GRAFTS ANGIOGRAPHY (N/A, 12/04/2017); Colonoscopy; Exchange of a dialysis catheter (Left, 01/11/2018); and IR Fluoro Guide CV Line Left (03/12/2018).   Current Medications: No outpatient medications have been marked as taking for the 10/15/18 encounter (Appointment) with Richardson Dopp T, PA-C.     Allergies:   Iodinated diagnostic agents; Lipitor [atorvastatin]; Metoprolol; and Baclofen   Social History   Tobacco Use  . Smoking status: Former Smoker    Types: Cigarettes    Last attempt to quit: 07/04/2014    Years since quitting: 4.2  . Smokeless tobacco: Never Used  . Tobacco comment: "smoked ~ 1 pack/month when I did smoke; never a steady smoker"  Substance Use Topics  . Alcohol use: Yes    Alcohol/week: 0.0 standard drinks    Comment: rare  "2 drinks, 1-2 times/year"  . Drug use: No     Family Hx: The patient's family history includes HIV in his sister; Hyperlipidemia in his mother; Hypertension in his mother.  ROS:   Please see the history of present illness.    ROS All other systems reviewed and are negative.   EKGs/Labs/Other Test Reviewed:    EKG:  EKG is *** ordered today.  The  ekg ordered today demonstrates ***  Recent Labs: 05/12/2018: Magnesium 1.9 10/01/2018: ALT 11 10/02/2018: B Natriuretic Peptide 3,561.2 10/05/2018: BUN 49; Creatinine, Ser 14.47; Hemoglobin 9.1; Platelets 177; Potassium 3.5; Sodium 139   Recent Lipid Panel Lab Results  Component Value Date/Time   CHOL 176 08/20/2018 07:23 AM   CHOL 176 07/02/2018 07:35 AM   TRIG 179 (H) 08/20/2018 07:23 AM   HDL 45 08/20/2018 07:23 AM   HDL 39 (L) 07/02/2018 07:35 AM   CHOLHDL 3.9 08/20/2018 07:23 AM   LDLCALC 95 08/20/2018 07:23 AM   LDLCALC 89 07/02/2018 07:35 AM    Physical Exam:    VS:  There were no vitals taken for this visit.    Wt Readings from Last 3 Encounters:  10/05/18 210 lb 12.2 oz (95.6 kg)  08/21/18 221 lb 1.9 oz (100.3 kg)  07/24/18 228 lb (103.4 kg)     ***Physical Exam  ASSESSMENT & PLAN:    No diagnosis found.*** Coronary artery disease involving native coronary artery of native heart with angina pectoris ([place]) Hx of remote CABG.  Cardiac Catheterization in May 2019 demonstrated an atretic L-LAD and a chronically occluded LAD with L-L and R-L collaterals.  CTO PCI would be difficult and medical  Rx was recommended.  He is overall stable with CCS Class II angina.  I will increase his dose of nitrates to see if we [name] control his symptoms more.  as far as renal transplant goes, his perioperative risk of major cardiac event is high at 11% according to the Revised Cardiac Risk Index (RCRI).  Given the results of his recent Cardiac Catheterization and his stable symptoms, he does not require any further cardiac testing.  I discussed his case with Dr. [name] who agreed he would be high risk for any procedure.  He would have to meet with the transplant surgeon to determine if he is a candidate for transplant.               - Increase Imdur to 90 mg QD             - Continue ASA, Plavix, Amlodpine, Ezetimibe, Rosuvastatin  Chronic diastolic CHF (congestive heart failure) ([place]) EF 45-50  by Echo in 2017.  Volume status appears stable.  His volume is managed by dialysis.  ESRD (end stage renal disease) ([place]) He remains on Tues, Thurs, Sat dialysis.    Essential hypertension The patient's blood pressure is controlled on his current regimen.  Continue current therapy.    Hyperlipidemia, unspecified hyperlipidemia type Continue statin, ezetimibe.   Typical atrial flutter (HCC) No recurrence since his ablation in 2018.  He is note on long term anticoagulation.  Dispo:  No follow-ups on file.   Medication Adjustments/Labs and Tests Ordered: Current medicines are reviewed at length with the patient today.  Concerns regarding medicines are outlined above.  Tests Ordered: No orders of the defined types were placed in this encounter.  Medication Changes: No orders of the defined types were placed in this encounter.   Signed, Richardson Dopp, PA-C  10/14/2018 10:07 PM    [facility] [address], [place], [place]  [address] Phone: [phone]; Fax: [phone]

## 2018-10-15 ENCOUNTER — Ambulatory Visit: Payer: Medicare Other | Admitting: Physician Assistant

## 2018-10-15 DIAGNOSIS — N2581 Secondary hyperparathyroidism of renal origin: Secondary | ICD-10-CM | POA: Diagnosis not present

## 2018-10-15 DIAGNOSIS — N186 End stage renal disease: Secondary | ICD-10-CM | POA: Diagnosis not present

## 2018-10-15 DIAGNOSIS — D631 Anemia in chronic kidney disease: Secondary | ICD-10-CM | POA: Diagnosis not present

## 2018-10-15 DIAGNOSIS — J14 Pneumonia due to Hemophilus influenzae: Secondary | ICD-10-CM | POA: Diagnosis not present

## 2018-10-16 DIAGNOSIS — N186 End stage renal disease: Secondary | ICD-10-CM | POA: Diagnosis not present

## 2018-10-16 DIAGNOSIS — N2581 Secondary hyperparathyroidism of renal origin: Secondary | ICD-10-CM | POA: Diagnosis not present

## 2018-10-16 DIAGNOSIS — J14 Pneumonia due to Hemophilus influenzae: Secondary | ICD-10-CM | POA: Diagnosis not present

## 2018-10-17 DIAGNOSIS — N186 End stage renal disease: Secondary | ICD-10-CM | POA: Diagnosis not present

## 2018-10-17 DIAGNOSIS — N2581 Secondary hyperparathyroidism of renal origin: Secondary | ICD-10-CM | POA: Diagnosis not present

## 2018-10-17 DIAGNOSIS — J14 Pneumonia due to Hemophilus influenzae: Secondary | ICD-10-CM | POA: Diagnosis not present

## 2018-10-18 DIAGNOSIS — N2581 Secondary hyperparathyroidism of renal origin: Secondary | ICD-10-CM | POA: Diagnosis not present

## 2018-10-18 DIAGNOSIS — N186 End stage renal disease: Secondary | ICD-10-CM | POA: Diagnosis not present

## 2018-10-18 DIAGNOSIS — J14 Pneumonia due to Hemophilus influenzae: Secondary | ICD-10-CM | POA: Diagnosis not present

## 2018-10-19 DIAGNOSIS — J14 Pneumonia due to Hemophilus influenzae: Secondary | ICD-10-CM | POA: Diagnosis not present

## 2018-10-19 DIAGNOSIS — N186 End stage renal disease: Secondary | ICD-10-CM | POA: Diagnosis not present

## 2018-10-19 DIAGNOSIS — N2581 Secondary hyperparathyroidism of renal origin: Secondary | ICD-10-CM | POA: Diagnosis not present

## 2018-10-20 DIAGNOSIS — J14 Pneumonia due to Hemophilus influenzae: Secondary | ICD-10-CM | POA: Diagnosis not present

## 2018-10-20 DIAGNOSIS — N2581 Secondary hyperparathyroidism of renal origin: Secondary | ICD-10-CM | POA: Diagnosis not present

## 2018-10-20 DIAGNOSIS — N186 End stage renal disease: Secondary | ICD-10-CM | POA: Diagnosis not present

## 2018-10-21 DIAGNOSIS — N186 End stage renal disease: Secondary | ICD-10-CM | POA: Diagnosis not present

## 2018-10-21 DIAGNOSIS — N2581 Secondary hyperparathyroidism of renal origin: Secondary | ICD-10-CM | POA: Diagnosis not present

## 2018-10-21 DIAGNOSIS — J14 Pneumonia due to Hemophilus influenzae: Secondary | ICD-10-CM | POA: Diagnosis not present

## 2018-10-22 DIAGNOSIS — N186 End stage renal disease: Secondary | ICD-10-CM | POA: Diagnosis not present

## 2018-10-22 DIAGNOSIS — J14 Pneumonia due to Hemophilus influenzae: Secondary | ICD-10-CM | POA: Diagnosis not present

## 2018-10-22 DIAGNOSIS — N2581 Secondary hyperparathyroidism of renal origin: Secondary | ICD-10-CM | POA: Diagnosis not present

## 2018-10-23 DIAGNOSIS — N186 End stage renal disease: Secondary | ICD-10-CM | POA: Diagnosis not present

## 2018-10-23 DIAGNOSIS — J14 Pneumonia due to Hemophilus influenzae: Secondary | ICD-10-CM | POA: Diagnosis not present

## 2018-10-23 DIAGNOSIS — N2581 Secondary hyperparathyroidism of renal origin: Secondary | ICD-10-CM | POA: Diagnosis not present

## 2018-10-24 DIAGNOSIS — N186 End stage renal disease: Secondary | ICD-10-CM | POA: Diagnosis not present

## 2018-10-24 DIAGNOSIS — N2581 Secondary hyperparathyroidism of renal origin: Secondary | ICD-10-CM | POA: Diagnosis not present

## 2018-10-24 DIAGNOSIS — J14 Pneumonia due to Hemophilus influenzae: Secondary | ICD-10-CM | POA: Diagnosis not present

## 2018-10-25 DIAGNOSIS — N186 End stage renal disease: Secondary | ICD-10-CM | POA: Diagnosis not present

## 2018-10-25 DIAGNOSIS — N2581 Secondary hyperparathyroidism of renal origin: Secondary | ICD-10-CM | POA: Diagnosis not present

## 2018-10-25 DIAGNOSIS — J14 Pneumonia due to Hemophilus influenzae: Secondary | ICD-10-CM | POA: Diagnosis not present

## 2018-10-26 DIAGNOSIS — N186 End stage renal disease: Secondary | ICD-10-CM | POA: Diagnosis not present

## 2018-10-26 DIAGNOSIS — J14 Pneumonia due to Hemophilus influenzae: Secondary | ICD-10-CM | POA: Diagnosis not present

## 2018-10-26 DIAGNOSIS — N2581 Secondary hyperparathyroidism of renal origin: Secondary | ICD-10-CM | POA: Diagnosis not present

## 2018-10-27 ENCOUNTER — Inpatient Hospital Stay (HOSPITAL_COMMUNITY)
Admission: EM | Admit: 2018-10-27 | Discharge: 2018-10-29 | DRG: 291 | Disposition: A | Discharge status: Home / Self Care | Payer: Medicare Other | Attending: Internal Medicine | Admitting: Internal Medicine

## 2018-10-27 ENCOUNTER — Emergency Department (HOSPITAL_COMMUNITY): Payer: Medicare Other

## 2018-10-27 DIAGNOSIS — E876 Hypokalemia: Secondary | ICD-10-CM

## 2018-10-27 DIAGNOSIS — I248 Other forms of acute ischemic heart disease: Secondary | ICD-10-CM | POA: Diagnosis present

## 2018-10-27 DIAGNOSIS — Y718 Miscellaneous cardiovascular devices associated with adverse incidents, not elsewhere classified: Secondary | ICD-10-CM | POA: Diagnosis present

## 2018-10-27 DIAGNOSIS — T8612 Kidney transplant failure: Secondary | ICD-10-CM | POA: Diagnosis present

## 2018-10-27 DIAGNOSIS — Z951 Presence of aortocoronary bypass graft: Secondary | ICD-10-CM

## 2018-10-27 DIAGNOSIS — D631 Anemia in chronic kidney disease: Secondary | ICD-10-CM | POA: Diagnosis not present

## 2018-10-27 DIAGNOSIS — R1111 Vomiting without nausea: Secondary | ICD-10-CM | POA: Diagnosis not present

## 2018-10-27 DIAGNOSIS — E8889 Other specified metabolic disorders: Secondary | ICD-10-CM | POA: Diagnosis present

## 2018-10-27 DIAGNOSIS — I82890 Acute embolism and thrombosis of other specified veins: Secondary | ICD-10-CM | POA: Diagnosis present

## 2018-10-27 DIAGNOSIS — N19 Unspecified kidney failure: Secondary | ICD-10-CM | POA: Diagnosis not present

## 2018-10-27 DIAGNOSIS — Z8249 Family history of ischemic heart disease and other diseases of the circulatory system: Secondary | ICD-10-CM

## 2018-10-27 DIAGNOSIS — E892 Postprocedural hypoparathyroidism: Secondary | ICD-10-CM | POA: Diagnosis present

## 2018-10-27 DIAGNOSIS — Z7984 Long term (current) use of oral hypoglycemic drugs: Secondary | ICD-10-CM

## 2018-10-27 DIAGNOSIS — E669 Obesity, unspecified: Secondary | ICD-10-CM | POA: Diagnosis present

## 2018-10-27 DIAGNOSIS — Z6833 Body mass index (BMI) 33.0-33.9, adult: Secondary | ICD-10-CM | POA: Diagnosis not present

## 2018-10-27 DIAGNOSIS — N2581 Secondary hyperparathyroidism of renal origin: Secondary | ICD-10-CM | POA: Diagnosis present

## 2018-10-27 DIAGNOSIS — E1151 Type 2 diabetes mellitus with diabetic peripheral angiopathy without gangrene: Secondary | ICD-10-CM | POA: Diagnosis present

## 2018-10-27 DIAGNOSIS — N186 End stage renal disease: Secondary | ICD-10-CM

## 2018-10-27 DIAGNOSIS — R531 Weakness: Secondary | ICD-10-CM | POA: Diagnosis not present

## 2018-10-27 DIAGNOSIS — I2510 Atherosclerotic heart disease of native coronary artery without angina pectoris: Secondary | ICD-10-CM | POA: Diagnosis present

## 2018-10-27 DIAGNOSIS — R7989 Other specified abnormal findings of blood chemistry: Secondary | ICD-10-CM | POA: Diagnosis not present

## 2018-10-27 DIAGNOSIS — M109 Gout, unspecified: Secondary | ICD-10-CM | POA: Diagnosis present

## 2018-10-27 DIAGNOSIS — R112 Nausea with vomiting, unspecified: Secondary | ICD-10-CM | POA: Diagnosis present

## 2018-10-27 DIAGNOSIS — J14 Pneumonia due to Hemophilus influenzae: Secondary | ICD-10-CM | POA: Diagnosis not present

## 2018-10-27 DIAGNOSIS — T82898A Other specified complication of vascular prosthetic devices, implants and grafts, initial encounter: Secondary | ICD-10-CM | POA: Diagnosis present

## 2018-10-27 DIAGNOSIS — I252 Old myocardial infarction: Secondary | ICD-10-CM | POA: Diagnosis not present

## 2018-10-27 DIAGNOSIS — T8610 Unspecified complication of kidney transplant: Secondary | ICD-10-CM | POA: Diagnosis not present

## 2018-10-27 DIAGNOSIS — Z8349 Family history of other endocrine, nutritional and metabolic diseases: Secondary | ICD-10-CM

## 2018-10-27 DIAGNOSIS — Z992 Dependence on renal dialysis: Secondary | ICD-10-CM

## 2018-10-27 DIAGNOSIS — I132 Hypertensive heart and chronic kidney disease with heart failure and with stage 5 chronic kidney disease, or end stage renal disease: Principal | ICD-10-CM | POA: Diagnosis present

## 2018-10-27 DIAGNOSIS — E1122 Type 2 diabetes mellitus with diabetic chronic kidney disease: Secondary | ICD-10-CM | POA: Diagnosis present

## 2018-10-27 DIAGNOSIS — R0602 Shortness of breath: Secondary | ICD-10-CM | POA: Diagnosis not present

## 2018-10-27 DIAGNOSIS — Z87442 Personal history of urinary calculi: Secondary | ICD-10-CM

## 2018-10-27 DIAGNOSIS — I4892 Unspecified atrial flutter: Secondary | ICD-10-CM | POA: Diagnosis present

## 2018-10-27 DIAGNOSIS — T8611 Kidney transplant rejection: Secondary | ICD-10-CM | POA: Diagnosis present

## 2018-10-27 DIAGNOSIS — Z7982 Long term (current) use of aspirin: Secondary | ICD-10-CM

## 2018-10-27 DIAGNOSIS — Z8701 Personal history of pneumonia (recurrent): Secondary | ICD-10-CM

## 2018-10-27 DIAGNOSIS — Z888 Allergy status to other drugs, medicaments and biological substances status: Secondary | ICD-10-CM

## 2018-10-27 DIAGNOSIS — N189 Chronic kidney disease, unspecified: Secondary | ICD-10-CM

## 2018-10-27 DIAGNOSIS — T8619 Other complication of kidney transplant: Secondary | ICD-10-CM

## 2018-10-27 DIAGNOSIS — I5033 Acute on chronic diastolic (congestive) heart failure: Secondary | ICD-10-CM | POA: Diagnosis present

## 2018-10-27 DIAGNOSIS — R140 Abdominal distension (gaseous): Secondary | ICD-10-CM | POA: Diagnosis present

## 2018-10-27 DIAGNOSIS — E785 Hyperlipidemia, unspecified: Secondary | ICD-10-CM | POA: Diagnosis present

## 2018-10-27 DIAGNOSIS — I429 Cardiomyopathy, unspecified: Secondary | ICD-10-CM | POA: Diagnosis present

## 2018-10-27 DIAGNOSIS — Z87891 Personal history of nicotine dependence: Secondary | ICD-10-CM

## 2018-10-27 DIAGNOSIS — Z79899 Other long term (current) drug therapy: Secondary | ICD-10-CM

## 2018-10-27 DIAGNOSIS — I10 Essential (primary) hypertension: Secondary | ICD-10-CM | POA: Diagnosis present

## 2018-10-27 DIAGNOSIS — Z91041 Radiographic dye allergy status: Secondary | ICD-10-CM

## 2018-10-27 DIAGNOSIS — R0600 Dyspnea, unspecified: Secondary | ICD-10-CM | POA: Diagnosis present

## 2018-10-27 DIAGNOSIS — I739 Peripheral vascular disease, unspecified: Secondary | ICD-10-CM | POA: Diagnosis present

## 2018-10-27 DIAGNOSIS — I120 Hypertensive chronic kidney disease with stage 5 chronic kidney disease or end stage renal disease: Secondary | ICD-10-CM | POA: Diagnosis not present

## 2018-10-27 DIAGNOSIS — Z7902 Long term (current) use of antithrombotics/antiplatelets: Secondary | ICD-10-CM

## 2018-10-27 DIAGNOSIS — R778 Other specified abnormalities of plasma proteins: Secondary | ICD-10-CM | POA: Diagnosis present

## 2018-10-27 DIAGNOSIS — R630 Anorexia: Secondary | ICD-10-CM | POA: Diagnosis present

## 2018-10-27 LAB — COMPREHENSIVE METABOLIC PANEL
ALT: 34 U/L (ref 0–44)
AST: 54 U/L — ABNORMAL HIGH (ref 15–41)
Albumin: 2.6 g/dL — ABNORMAL LOW (ref 3.5–5.0)
Alkaline Phosphatase: 59 U/L (ref 38–126)
Anion gap: 17 — ABNORMAL HIGH (ref 5–15)
BUN: 47 mg/dL — ABNORMAL HIGH (ref 8–23)
CO2: 23 mmol/L (ref 22–32)
Calcium: 5.8 mg/dL — CL (ref 8.9–10.3)
Chloride: 99 mmol/L (ref 98–111)
Creatinine, Ser: 20.04 mg/dL — ABNORMAL HIGH (ref 0.61–1.24)
GFR calc Af Amer: 2 mL/min — ABNORMAL LOW (ref >60)
GFR calc non Af Amer: 2 mL/min — ABNORMAL LOW (ref >60)
Glucose, Bld: 95 mg/dL (ref 70–99)
Potassium: 2.9 mmol/L — ABNORMAL LOW (ref 3.5–5.1)
Sodium: 139 mmol/L (ref 135–145)
Total Bilirubin: 0.8 mg/dL (ref 0.3–1.2)
Total Protein: 7.3 g/dL (ref 6.5–8.1)

## 2018-10-27 LAB — CBC WITH DIFFERENTIAL/PLATELET
Abs Immature Granulocytes: 0.01 10*3/uL (ref 0.00–0.07)
Basophils Absolute: 0 10*3/uL (ref 0.0–0.1)
Basophils Relative: 1 %
Eosinophils Absolute: 0.4 10*3/uL (ref 0.0–0.5)
Eosinophils Relative: 7 %
HCT: 26.2 % — ABNORMAL LOW (ref 39.0–52.0)
Hemoglobin: 7.6 g/dL — ABNORMAL LOW (ref 13.0–17.0)
Immature Granulocytes: 0 %
Lymphocytes Relative: 25 %
Lymphs Abs: 1.3 10*3/uL (ref 0.7–4.0)
MCH: 26.2 pg (ref 26.0–34.0)
MCHC: 29 g/dL — ABNORMAL LOW (ref 30.0–36.0)
MCV: 90.3 fL (ref 80.0–100.0)
Monocytes Absolute: 0.5 10*3/uL (ref 0.1–1.0)
Monocytes Relative: 10 %
Neutro Abs: 3.1 10*3/uL (ref 1.7–7.7)
Neutrophils Relative %: 57 %
Platelets: 155 10*3/uL (ref 150–400)
RBC: 2.9 MIL/uL — ABNORMAL LOW (ref 4.22–5.81)
RDW: 18.3 % — ABNORMAL HIGH (ref 11.5–15.5)
WBC: 5.4 10*3/uL (ref 4.0–10.5)
nRBC: 0 % (ref 0.0–0.2)

## 2018-10-27 LAB — POCT I-STAT EG7
Acid-Base Excess: 2 mmol/L (ref 0.0–2.0)
Bicarbonate: 26.9 mmol/L (ref 20.0–28.0)
Calcium, Ion: 0.68 mmol/L — CL (ref 1.15–1.40)
HCT: 25 % — ABNORMAL LOW (ref 39.0–52.0)
Hemoglobin: 8.5 g/dL — ABNORMAL LOW (ref 13.0–17.0)
O2 Saturation: 89 %
Potassium: 2.9 mmol/L — ABNORMAL LOW (ref 3.5–5.1)
Sodium: 141 mmol/L (ref 135–145)
TCO2: 28 mmol/L (ref 22–32)
pCO2, Ven: 42.1 mmHg — ABNORMAL LOW (ref 44.0–60.0)
pH, Ven: 7.414 (ref 7.250–7.430)
pO2, Ven: 55 mmHg — ABNORMAL HIGH (ref 32.0–45.0)

## 2018-10-27 LAB — IRON AND TIBC
Iron: 25 ug/dL — ABNORMAL LOW (ref 45–182)
Saturation Ratios: 18 % (ref 17.9–39.5)
TIBC: 143 ug/dL — ABNORMAL LOW (ref 250–450)
UIBC: 118 ug/dL

## 2018-10-27 LAB — TROPONIN I
Troponin I: 0.48 ng/mL (ref <0.03)
Troponin I: 0.77 ng/mL (ref <0.03)

## 2018-10-27 LAB — I-STAT CREATININE, ED: Creatinine, Ser: >18 mg/dL — ABNORMAL HIGH (ref 0.61–1.24)

## 2018-10-27 LAB — CBC
HCT: 23.9 % — ABNORMAL LOW (ref 39.0–52.0)
Hemoglobin: 7.2 g/dL — ABNORMAL LOW (ref 13.0–17.0)
MCH: 26.8 pg (ref 26.0–34.0)
MCHC: 30.1 g/dL (ref 30.0–36.0)
MCV: 88.8 fL (ref 80.0–100.0)
Platelets: 149 10*3/uL — ABNORMAL LOW (ref 150–400)
RBC: 2.69 MIL/uL — ABNORMAL LOW (ref 4.22–5.81)
RDW: 18.2 % — ABNORMAL HIGH (ref 11.5–15.5)
WBC: 4.9 10*3/uL (ref 4.0–10.5)
nRBC: 0 % (ref 0.0–0.2)

## 2018-10-27 LAB — CREATININE, SERUM
Creatinine, Ser: 20.18 mg/dL — ABNORMAL HIGH (ref 0.61–1.24)
GFR calc Af Amer: 2 mL/min — ABNORMAL LOW (ref >60)
GFR calc non Af Amer: 2 mL/min — ABNORMAL LOW (ref >60)

## 2018-10-27 LAB — FERRITIN: Ferritin: 1613 ng/mL — ABNORMAL HIGH (ref 24–336)

## 2018-10-27 LAB — BRAIN NATRIURETIC PEPTIDE: B Natriuretic Peptide: 3129.6 pg/mL — ABNORMAL HIGH (ref 0.0–100.0)

## 2018-10-27 MED ORDER — RENA-VITE PO TABS
1.0000 | ORAL_TABLET | Freq: Every day | ORAL | Status: DC
  Administered 2018-10-28 – 2018-10-29 (×2): 1 via ORAL
  Filled 2018-10-27 (×2): qty 1

## 2018-10-27 MED ORDER — SODIUM CHLORIDE 0.9% FLUSH
3.0000 mL | Freq: Two times a day (BID) | INTRAVENOUS | Status: DC
  Administered 2018-10-28 – 2018-10-29 (×3): 3 mL via INTRAVENOUS

## 2018-10-27 MED ORDER — CHLORHEXIDINE GLUCONATE CLOTH 2 % EX PADS
6.0000 | MEDICATED_PAD | Freq: Every day | CUTANEOUS | Status: DC
  Administered 2018-10-28: 6 via TOPICAL

## 2018-10-27 MED ORDER — HEPARIN SODIUM (PORCINE) 5000 UNIT/ML IJ SOLN
5000.0000 [IU] | Freq: Three times a day (TID) | INTRAMUSCULAR | Status: DC
  Administered 2018-10-28 – 2018-10-29 (×3): 5000 [IU] via SUBCUTANEOUS
  Filled 2018-10-27 (×3): qty 1

## 2018-10-27 MED ORDER — ALTEPLASE 2 MG IJ SOLR: 2.0000 mg | Freq: Once | INTRAMUSCULAR | Status: DC | PRN

## 2018-10-27 MED ORDER — CALCITRIOL 0.5 MCG PO CAPS
1.0000 ug | ORAL_CAPSULE | Freq: Once | ORAL | Status: AC
  Administered 2018-10-27: 1 ug via ORAL

## 2018-10-27 MED ORDER — DOCUSATE SODIUM 100 MG PO CAPS: 100.0000 mg | ORAL_CAPSULE | Freq: Every day | ORAL | Status: DC | PRN

## 2018-10-27 MED ORDER — CALCIUM GLUCONATE-NACL 2-0.675 GM/100ML-% IV SOLN
2.0000 g | Freq: Once | INTRAVENOUS | Status: AC
  Administered 2018-10-28: 2000 mg via INTRAVENOUS
  Filled 2018-10-27 (×2): qty 100

## 2018-10-27 MED ORDER — ALBUTEROL SULFATE HFA 108 (90 BASE) MCG/ACT IN AERS: 1.0000 | INHALATION_SPRAY | Freq: Four times a day (QID) | RESPIRATORY_TRACT | Status: DC | PRN

## 2018-10-27 MED ORDER — ALBUTEROL SULFATE (2.5 MG/3ML) 0.083% IN NEBU: 2.5000 mg | INHALATION_SOLUTION | Freq: Four times a day (QID) | RESPIRATORY_TRACT | Status: DC | PRN

## 2018-10-27 MED ORDER — ISOSORBIDE MONONITRATE ER 60 MG PO TB24
120.0000 mg | ORAL_TABLET | Freq: Every day | ORAL | Status: DC
  Administered 2018-10-28 – 2018-10-29 (×2): 120 mg via ORAL
  Filled 2018-10-27 (×2): qty 2

## 2018-10-27 MED ORDER — HEPARIN SODIUM (PORCINE) 1000 UNIT/ML IJ SOLN
INTRAMUSCULAR | Status: AC
  Filled 2018-10-27: qty 5

## 2018-10-27 MED ORDER — DARBEPOETIN ALFA 100 MCG/0.5ML IJ SOSY
PREFILLED_SYRINGE | INTRAMUSCULAR | Status: AC
  Filled 2018-10-27: qty 0.5

## 2018-10-27 MED ORDER — POLYETHYLENE GLYCOL 3350 17 G PO PACK: 17.0000 g | PACK | Freq: Every day | ORAL | Status: DC | PRN

## 2018-10-27 MED ORDER — ONDANSETRON HCL 4 MG PO TABS: 4.0000 mg | ORAL_TABLET | Freq: Four times a day (QID) | ORAL | Status: DC | PRN

## 2018-10-27 MED ORDER — LIDOCAINE-PRILOCAINE 2.5-2.5 % EX CREA: 1.0000 "application " | TOPICAL_CREAM | CUTANEOUS | Status: DC | PRN

## 2018-10-27 MED ORDER — SODIUM CHLORIDE 0.9 % IV SOLN: 100.0000 mL | INTRAVENOUS | Status: DC | PRN

## 2018-10-27 MED ORDER — CALCITRIOL 0.5 MCG PO CAPS
ORAL_CAPSULE | ORAL | Status: AC
  Filled 2018-10-27: qty 2

## 2018-10-27 MED ORDER — ROSUVASTATIN CALCIUM 5 MG PO TABS
10.0000 mg | ORAL_TABLET | Freq: Every day | ORAL | Status: DC
  Administered 2018-10-28: 10 mg via ORAL
  Filled 2018-10-27: qty 2

## 2018-10-27 MED ORDER — POTASSIUM CHLORIDE 10 MEQ/100ML IV SOLN: 10.0000 meq | INTRAVENOUS | Status: AC

## 2018-10-27 MED ORDER — ASPIRIN EC 81 MG PO TBEC
81.0000 mg | DELAYED_RELEASE_TABLET | Freq: Every day | ORAL | Status: DC
  Administered 2018-10-28 – 2018-10-29 (×2): 81 mg via ORAL
  Filled 2018-10-27 (×2): qty 1

## 2018-10-27 MED ORDER — DARBEPOETIN ALFA 100 MCG/0.5ML IJ SOSY
100.0000 ug | PREFILLED_SYRINGE | Freq: Once | INTRAMUSCULAR | Status: AC
  Administered 2018-10-27: 100 ug via INTRAVENOUS
  Filled 2018-10-27: qty 0.5

## 2018-10-27 MED ORDER — AMLODIPINE BESYLATE 2.5 MG PO TABS
5.0000 mg | ORAL_TABLET | Freq: Every day | ORAL | Status: DC
  Administered 2018-10-28 – 2018-10-29 (×2): 5 mg via ORAL
  Filled 2018-10-27 (×2): qty 2

## 2018-10-27 MED ORDER — ONDANSETRON HCL 4 MG/2ML IJ SOLN: 4.0000 mg | Freq: Four times a day (QID) | INTRAMUSCULAR | Status: DC | PRN

## 2018-10-27 MED ORDER — REPAGLINIDE 1 MG PO TABS
0.5000 mg | ORAL_TABLET | Freq: Two times a day (BID) | ORAL | Status: DC
  Administered 2018-10-28 – 2018-10-29 (×3): 0.5 mg via ORAL
  Filled 2018-10-27 (×5): qty 0.5

## 2018-10-27 MED ORDER — BISOPROLOL FUMARATE 5 MG PO TABS
5.0000 mg | ORAL_TABLET | Freq: Every day | ORAL | Status: DC
  Administered 2018-10-28 – 2018-10-29 (×2): 5 mg via ORAL
  Filled 2018-10-27 (×2): qty 1

## 2018-10-27 MED ORDER — GENTAMICIN SULFATE 0.1 % EX CREA
1.0000 "application " | TOPICAL_CREAM | Freq: Every day | CUTANEOUS | Status: DC | PRN
  Filled 2018-10-27: qty 15

## 2018-10-27 MED ORDER — HEPARIN SODIUM (PORCINE) 1000 UNIT/ML IJ SOLN
1900.0000 [IU] | Freq: Once | INTRAMUSCULAR | Status: AC
  Administered 2018-10-27: 20:00:00 1900 [IU] via INTRAVENOUS
  Filled 2018-10-27: qty 1.9

## 2018-10-27 MED ORDER — PENTAFLUOROPROP-TETRAFLUOROETH EX AERO: 1.0000 "application " | INHALATION_SPRAY | CUTANEOUS | Status: DC | PRN

## 2018-10-27 MED ORDER — HEPARIN SODIUM (PORCINE) 1000 UNIT/ML IJ SOLN
1000.0000 [IU] | INTRAMUSCULAR | Status: DC
  Administered 2018-10-27 – 2018-10-29 (×3): 4200 [IU] via INTRAVENOUS
  Filled 2018-10-27: qty 1

## 2018-10-27 MED ORDER — SODIUM CHLORIDE 0.9 % IV SOLN
125.0000 mg | Freq: Once | INTRAVENOUS | Status: AC
  Administered 2018-10-27: 125 mg via INTRAVENOUS
  Filled 2018-10-27: qty 10

## 2018-10-27 MED ORDER — POTASSIUM CHLORIDE CRYS ER 20 MEQ PO TBCR
30.0000 meq | EXTENDED_RELEASE_TABLET | Freq: Once | ORAL | Status: AC
  Administered 2018-10-28: 01:00:00 30 meq via ORAL
  Filled 2018-10-27: qty 1

## 2018-10-27 MED ORDER — HEPARIN SODIUM (PORCINE) 1000 UNIT/ML IJ SOLN
INTRAMUSCULAR | Status: AC
  Administered 2018-10-27: 1900 [IU] via INTRAVENOUS
  Filled 2018-10-27: qty 2

## 2018-10-27 MED ORDER — ALBUTEROL SULFATE HFA 108 (90 BASE) MCG/ACT IN AERS
2.0000 | INHALATION_SPRAY | Freq: Four times a day (QID) | RESPIRATORY_TRACT | Status: DC | PRN
  Filled 2018-10-27: qty 6.7

## 2018-10-27 MED ORDER — TRAMADOL HCL 50 MG PO TABS: 50.0000 mg | ORAL_TABLET | Freq: Four times a day (QID) | ORAL | Status: DC | PRN

## 2018-10-27 MED ORDER — ZOLPIDEM TARTRATE 5 MG PO TABS: 5.0000 mg | ORAL_TABLET | Freq: Every evening | ORAL | Status: DC | PRN

## 2018-10-27 MED ORDER — CLOPIDOGREL BISULFATE 75 MG PO TABS
75.0000 mg | ORAL_TABLET | Freq: Every day | ORAL | Status: DC
  Administered 2018-10-28 – 2018-10-29 (×2): 75 mg via ORAL
  Filled 2018-10-27 (×2): qty 1

## 2018-10-27 MED ORDER — CALCIUM ACETATE (PHOS BINDER) 667 MG PO CAPS
1334.0000 mg | ORAL_CAPSULE | Freq: Three times a day (TID) | ORAL | Status: DC
  Administered 2018-10-28 (×2): 1334 mg via ORAL
  Filled 2018-10-27 (×4): qty 2

## 2018-10-27 MED ORDER — COLCHICINE 0.6 MG PO TABS: 0.6000 mg | ORAL_TABLET | ORAL | Status: DC

## 2018-10-27 MED ORDER — LIDOCAINE HCL (PF) 1 % IJ SOLN: 5.0000 mL | INTRAMUSCULAR | Status: DC | PRN

## 2018-10-27 MED ORDER — ACETAMINOPHEN 500 MG PO TABS
1000.0000 mg | ORAL_TABLET | Freq: Four times a day (QID) | ORAL | Status: DC | PRN
  Filled 2018-10-27: qty 2

## 2018-10-27 NOTE — ED Triage Notes (Signed)
Pt reports SOB and weakness that is worse upon exertion x 2 days, has been doing in home dialysis for 2 weeks (was going to a facility for it before), denies fever or cough. Denies sick contacts but wife works in [employer]. Pt placed on 3L [place] for comfort with EMS. Abdominal port for dialysis present. A&O x4.

## 2018-10-27 NOTE — ED Notes (Signed)
Provider at bedside

## 2018-10-27 NOTE — ED Notes (Signed)
ED TO INPATIENT HANDOFF REPORT  ED Nurse Name and Phone #: Ardelle Park RN [phone]  S Name/Age/Gender Patrick [name] 63 y.o. male Room/Bed: 043C/043C  Code Status   Code Status: Full Code  Home/SNF/Other Home Patient oriented to: situation Is this baseline? Yes   Triage Complete: Triage complete  Chief Complaint sob  Triage Note Pt reports SOB and weakness that is worse upon exertion x 2 days, has been doing in home dialysis for 2 weeks (was going to a facility for it before), denies fever or cough. Denies sick contacts but wife works in [employer]. Pt placed on 3L [place] for comfort with EMS. Abdominal port for dialysis present. A&O x4.    Allergies Allergies  Allergen Reactions  . Iodinated Diagnostic Agents Swelling, Rash and Other (See Comments)    Other Reaction: burning to mouth, swelling of lips  . Lipitor [Atorvastatin] Other (See Comments)    Leg pain  . Metoprolol Other (See Comments)    Headaches   . Baclofen Other (See Comments)    Possibly stroke like symptoms    Level of Care/Admitting Diagnosis ED Disposition    ED Disposition Condition [facility] Area: [place] [[id number]]  Level of Care: Telemetry Cardiac [103]  Diagnosis: Acute on chronic diastolic (congestive) heart failure [facility]) [[id number]]  Admitting Physician: Lady Deutscher [[id number]]  Attending Physician: Lady Deutscher [[id number]]  Estimated length of stay: 3 - 4 days  Certification:: I certify this patient will need inpatient services for at least 2 midnights  PT Class (Do Not Modify): Inpatient [101]  PT Acc Code (Do Not Modify): Private [1]       B Medical/Surgery History Past Medical History:  Diagnosis Date  . Arthritis   . Atrial flutter ([place])    Ablation 2018  . Coronary artery disease    Anomalous left main off RCA status post CABG 1997, cardiac catheterization May 2019 demonstrated atretic LIMA to LAD and occluded LAD with left to left and  left-to-right collaterals  . Erectile dysfunction   . ESRD (end stage renal disease) on dialysis ([place])    Pt on HD 2003 >> transplanted in 2009, back on HD in 2016. [name].   . Essential hypertension   . Gout   . Hernia of abdominal wall   . History of blood transfusion   . History of cardiomyopathy   . History of kidney stones   . History of pneumonia   . Migraine   . Myocardial infarction ([place])    1996  . Secondary hyperparathyroidism ([place])   . Type 2 diabetes mellitus ([place])   . Wears glasses    Past Surgical History:  Procedure Laterality Date  . A-FLUTTER ABLATION N/A 09/24/2016   Procedure: A-Flutter Ablation;  Surgeon: Will Meredith Leeds, MD;  Location: [place] [facility];  Service: Cardiovascular;  Laterality: N/A;  . ABDOMINAL AORTOGRAM W/LOWER EXTREMITY N/A 04/13/2017   Procedure: ABDOMINAL AORTOGRAM W/LOWER EXTREMITY;  Surgeon: Conrad Cambria, MD;  Location: [place] [facility];  Service: Cardiovascular;  Laterality: N/A;  Bilater lower extermity  . APPENDECTOMY    . AV FISTULA PLACEMENT Right 09/18/2014   Procedure: INSERTION OF ARTERIOVENOUS (AV) GORE-TEX GRAFT ARM USING 4-7MM  X 45CM STRETCH GORE-TEX VASCULAR GRAFT;  Surgeon: Rosetta Posner, MD;  Location: [facility];  Service: Vascular;  Laterality: Right;  . AV FISTULA PLACEMENT Left 07/07/2016   Procedure: INSERTION OF LEFT BRACHIAL TO AXILLARY ARTERIOVENOUS (AV) GORE-TEX  ARM GRAFT;  Surgeon: Conrad Emporia, MD;  Location: [place];  Service: Vascular;  Laterality: Left;  . CARDIAC CATHETERIZATION  ~ 2016  . COLONOSCOPY    . 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 EXCHANGE OF A DIALYSIS CATHETER Left 01/11/2018   Procedure: EXCHANGE OF TUNNELED DIALYSIS CATHETER;  Surgeon: Rosetta Posner, MD;  Location: [facility];  Service: Vascular;  Laterality: Left;  . HERNIA REPAIR  2017   with nephrectomy  . INSERTION OF DIALYSIS CATHETER N/A 10/08/2017    Procedure: INSERTION OF TUNNELED DIALYSIS CATHETER;  Surgeon: Conrad Ward, MD;  Location: [facility];  Service: Vascular;  Laterality: N/A;  . IR FLUORO GUIDE CV LINE LEFT  03/12/2018  . IR GENERIC HISTORICAL  05/11/2016   IR FLUORO GUIDE CV LINE LEFT 05/11/2016 Corrie Mckusick, DO [facility]  . IR GENERIC HISTORICAL  05/11/2016   IR US GUIDE VASC ACCESS LEFT 05/11/2016 Corrie Mckusick, DO [facility]  . IR GENERIC HISTORICAL  05/11/2016   IR US GUIDE VASC ACCESS RIGHT 05/11/2016 Corrie Mckusick, DO [facility]  . IR GENERIC HISTORICAL  05/11/2016   IR RADIOLOGY PERIPHERAL GUIDED IV START 05/11/2016 Corrie Mckusick, DO [facility]  . KIDNEY TRANSPLANT  2009  . LEFT HEART CATH AND CORS/GRAFTS ANGIOGRAPHY N/A 12/04/2017   Procedure: LEFT HEART CATH AND CORS/GRAFTS ANGIOGRAPHY;  Surgeon: Belva Crome, MD;  Location: [place] [facility];  Service: Cardiovascular;  Laterality: N/A;  . NEPHRECTOMY  2017   transplant rejected   . PERIPHERAL VASCULAR CATHETERIZATION N/A 06/04/2016   Procedure: Upper Extremity Venography;  Surgeon: Waynetta Sandy, MD;  Location: [facility];  Service: Cardiovascular;  Laterality: N/A;  . PERIPHERAL VASCULAR INTERVENTION  04/13/2017   Procedure: PERIPHERAL VASCULAR INTERVENTION;  Surgeon: Conrad [place], MD;  Location: [place] [facility];  Service: Cardiovascular;;  Lt. Common/Exernal  Iliac  . THROMBECTOMY AND REVISION OF ARTERIOVENTOUS (AV) GORETEX  GRAFT Left 10/08/2017   Procedure: THROMBECTOMY of ARTERIOVENTOUS (AV) GORETEX  GRAFT LEFT UPPER ARM;  Surgeon: Conrad [place], MD;  Location: [facility];  Service: Vascular;  Laterality: Left;  . THROMBECTOMY W/ EMBOLECTOMY Left 09/14/2017   Procedure: THROMBECTOMY ARTERIOVENOUS GORE-TEX GRAFT LEFT UPPER ARM;  Surgeon: Rosetta Posner, MD;  Location: [facility];  Service: Vascular;  Laterality: Left;  . UPPER EXTREMITY VENOGRAPHY N/A 11/16/2017   Procedure: UPPER EXTREMITY VENOGRAPHY - Right Arm;  Surgeon: Conrad [place], MD;   Location: [place];  Service: Cardiovascular;  Laterality: N/A;     A IV Location/Drains/Wounds Patient Lines/Drains/Airways Status   Active Line/Drains/Airways    Name:   Placement date:   Placement time:   Site:   Days:   Fistula / Graft Left Upper arm Arteriovenous vein graft   10/08/17    1440    Upper arm   384   Fistula / Graft Right Upper arm Arteriovenous fistula   09/18/14    0819    Upper arm   1500   Fistula / Graft Right Upper arm Arteriovenous fistula   -    -    Upper arm      Fistula / Graft Left Upper arm Arteriovenous vein graft   07/07/16    0901    Upper arm   842   Hemodialysis Catheter Left Internal jugular Double-lumen;Permanent   03/12/18    1439    Internal  jugular   229   Closed System Drain 1 Right RUQ Bulb (JP) 10 Fr.   10/02/18    1151    RUQ   25   Closed System Drain 2 Right RUQ Bulb (JP) 10 Fr.   10/02/18    1158    RUQ   25   Incision (Closed) 09/18/14 Arm Right   09/18/14    0813     1500   Incision (Closed) 09/18/14 Arm Right   09/18/14    0813     1500   Incision (Closed) 07/07/16 Arm Left   07/07/16    1004     842   Incision (Closed) 09/14/17 Arm Left   09/14/17    1431     408   Incision (Closed) 10/08/17 Arm Left   10/08/17    1437     384   Incision (Closed) 01/11/18 Chest Left   01/11/18    0929     289          Intake/Output Last 24 hours No intake or output data in the 24 hours ending 10/27/18 1742  Labs/Imaging Results for orders placed or performed during the hospital encounter of 10/27/18 (from the past 48 hour(s))  CBC with Differential/Platelet     Status: Abnormal   Collection Time: 10/27/18  3:11 PM  Result Value Ref Range   WBC 5.4 4.0 - 10.5 K/uL   RBC 2.90 (L) 4.22 - 5.81 MIL/uL   Hemoglobin 7.6 (L) 13.0 - 17.0 g/dL   HCT 26.2 (L) 39.0 - 52.0 %   MCV 90.3 80.0 - 100.0 fL   MCH 26.2 26.0 - 34.0 pg   MCHC 29.0 (L) 30.0 - 36.0 g/dL   RDW 18.3 (H) 11.5 - 15.5 %   Platelets 155 150 - 400 K/uL   nRBC 0.0 0.0 - 0.2 %    Neutrophils Relative % 57 %   Neutro Abs 3.1 1.7 - 7.7 K/uL   Lymphocytes Relative 25 %   Lymphs Abs 1.3 0.7 - 4.0 K/uL   Monocytes Relative 10 %   Monocytes Absolute 0.5 0.1 - 1.0 K/uL   Eosinophils Relative 7 %   Eosinophils Absolute 0.4 0.0 - 0.5 K/uL   Basophils Relative 1 %   Basophils Absolute 0.0 0.0 - 0.1 K/uL   Immature Granulocytes 0 %   Abs Immature Granulocytes 0.01 0.00 - 0.07 K/uL    Comment: Performed at [facility], [address], [place], [place] [address]  Comprehensive metabolic panel     Status: Abnormal   Collection Time: 10/27/18  3:11 PM  Result Value Ref Range   Sodium 139 135 - 145 mmol/L   Potassium 2.9 (L) 3.5 - 5.1 mmol/L   Chloride 99 98 - 111 mmol/L   CO2 23 22 - 32 mmol/L   Glucose, Bld 95 70 - 99 mg/dL   BUN 47 (H) 8 - 23 mg/dL   Creatinine, Ser 20.04 (H) 0.61 - 1.24 mg/dL   Calcium 5.8 (LL) 8.9 - 10.3 mg/dL    Comment: CRITICAL RESULT CALLED TO, READ BACK BY AND VERIFIED WITH: L Susana Duell,RN 1612 10/27/2018 D BRADLEY    Total Protein 7.3 6.5 - 8.1 g/dL   Albumin 2.6 (L) 3.5 - 5.0 g/dL   AST 54 (H) 15 - 41 U/L   ALT 34 0 - 44 U/L   Alkaline Phosphatase 59 38 - 126 U/L   Total Bilirubin 0.8 0.3 - 1.2 mg/dL  GFR calc non Af Amer 2 (L) >60 mL/min   GFR calc Af Amer 2 (L) >60 mL/min   Anion gap 17 (H) 5 - 15    Comment: Performed at [facility] [address] [place], [place] [address]  Troponin I - ONCE - STAT     Status: Abnormal   Collection Time: 10/27/18  3:11 PM  Result Value Ref Range   Troponin I 0.48 (HH) <0.03 ng/mL    Comment: CRITICAL RESULT CALLED TO, READ BACK BY AND VERIFIED WITH: L Kirsty Monjaraz,RN 1612 10/27/2018 D BRADLEY Performed at [facility], [place] [address] [place], [place] [address]   I-Stat Creatinine, ED (not at [facility])     Status: Abnormal   Collection Time: 10/27/18  3:24 PM  Result Value Ref Range   Creatinine, Ser >18.00 (H) 0.61 - 1.24 mg/dL  POCT I-Stat EG7     Status: Abnormal   Collection Time:  10/27/18  3:24 PM  Result Value Ref Range   pH, Ven 7.414 7.250 - 7.430   pCO2, Ven 42.1 (L) 44.0 - 60.0 mmHg   pO2, Ven 55.0 (H) 32.0 - 45.0 mmHg   Bicarbonate 26.9 20.0 - 28.0 mmol/L   TCO2 28 22 - 32 mmol/L   O2 Saturation 89.0 %   Acid-Base Excess 2.0 0.0 - 2.0 mmol/L   Sodium 141 135 - 145 mmol/L   Potassium 2.9 (L) 3.5 - 5.1 mmol/L   Calcium, Ion 0.68 (LL) 1.15 - 1.40 mmol/L   HCT 25.0 (L) 39.0 - 52.0 %   Hemoglobin 8.5 (L) 13.0 - 17.0 g/dL   Patient temperature HIDE    Sample type VENOUS    Comment NOTIFIED PHYSICIAN    Dg Chest Port 1 View  Result Date: 10/27/2018 CLINICAL DATA:  Shortness of breath. EXAM: PORTABLE CHEST 1 VIEW COMPARISON:  Radiograph October 03, 2018. FINDINGS: Stable cardiomegaly. Left internal jugular dialysis catheter is unchanged in position. No pneumothorax is noted. Right lung is clear. Stable left basilar opacity is noted concerning for atelectasis and possible small effusion. Bony thorax is unremarkable. IMPRESSION: Stable left basilar opacity as described above. Electronically Signed   By: Marijo Conception, M.D.   On: 10/27/2018 15:26    Pending Labs Unresulted Labs (From admission, onward)    Start     Ordered   10/28/18 6226  Basic metabolic panel  Tomorrow morning,   R     10/27/18 1710   10/28/18 0500  CBC  Tomorrow morning,   R     10/27/18 1710   10/28/18 0500  Calcium, ionized  Tomorrow morning,   R     10/27/18 1710   10/27/18 1707  Troponin I - Now Then Q6H  Now then every 6 hours,   R     10/27/18 1710   10/27/18 1702  CBC  (heparin)  Once,   R    Comments:  Baseline for heparin therapy IF NOT ALREADY DRAWN.  Notify MD if PLT < 100 K.    10/27/18 1710   10/27/18 1702  Creatinine, serum  (heparin)  Once,   R    Comments:  Baseline for heparin therapy IF NOT ALREADY DRAWN.    10/27/18 1710   10/27/18 1511  Brain natriuretic peptide  Once,   R     10/27/18 1510          Vitals/Pain Today's Vitals   10/27/18 1510 10/27/18 1512  10/27/18 1515 10/27/18 1530  BP:  133/79 133/79 132/84 128/75  Pulse:  91    Resp: (!) 21 (!) 22 (!) 24 20  Temp:  99.4 F (37.4 C)    TempSrc:  Oral    SpO2:  96%      Isolation Precautions No active isolations  Medications Medications  acetaminophen (TYLENOL) tablet 1,000 mg (has no administration in time range)  aspirin EC tablet 81 mg (has no administration in time range)  colchicine tablet 0.6 mg (has no administration in time range)  amLODipine (NORVASC) tablet 5 mg (has no administration in time range)  bisoprolol (ZEBETA) tablet 5 mg (has no administration in time range)  isosorbide mononitrate (IMDUR) 24 hr tablet 120 mg (has no administration in time range)  rosuvastatin (CRESTOR) tablet 10 mg (has no administration in time range)  repaglinide (PRANDIN) tablet 0.5 mg (has no administration in time range)  calcium acetate (PHOSLO) capsule 1,334 mg (has no administration in time range)  docusate sodium (COLACE) capsule 100 mg (has no administration in time range)  clopidogrel (PLAVIX) tablet 75 mg (has no administration in time range)  multivitamin (RENA-VIT) tablet 1 tablet (has no administration in time range)  gentamicin cream (GARAMYCIN) 0.1 % 1 application (has no administration in time range)  potassium chloride 10 mEq in 100 mL IVPB (has no administration in time range)  potassium chloride (K-DUR,KLOR-CON) CR tablet 30 mEq (has no administration in time range)  calcium gluconate 2 g/ 100 mL sodium chloride IVPB (has no administration in time range)  heparin injection 5,000 Units (has no administration in time range)  sodium chloride flush (NS) 0.9 % injection 3 mL (has no administration in time range)  traMADol (ULTRAM) tablet 50 mg (has no administration in time range)  zolpidem (AMBIEN) tablet 5 mg (has no administration in time range)  polyethylene glycol (MIRALAX / GLYCOLAX) packet 17 g (has no administration in time range)  ondansetron (ZOFRAN) tablet 4 mg (has  no administration in time range)    Or  ondansetron (ZOFRAN) injection 4 mg (has no administration in time range)  albuterol (PROVENTIL HFA;VENTOLIN HFA) 108 (90 Base) MCG/ACT inhaler 2 puff (has no administration in time range)  Chlorhexidine Gluconate Cloth 2 % PADS 6 each (has no administration in time range)  Darbepoetin Alfa (ARANESP) injection 100 mcg (has no administration in time range)  ferric gluconate (NULECIT) 125 mg in sodium chloride 0.9 % 100 mL IVPB (has no administration in time range)  calcitRIOL (ROCALTROL) capsule 1 mcg (has no administration in time range)    Mobility walks with device     Focused Assessments Pulmonary Assessment Handoff:  Lung sounds: Bilateral Breath Sounds: Diminished L Breath Sounds: Diminished R Breath Sounds: Diminished O2 Device: Room Air        R Recommendations: See Admitting Provider Note  Report given to:   Additional Notes: Self dialysis at home. Right arm restricted.

## 2018-10-27 NOTE — ED Notes (Signed)
Dr. Darl Householder aware of pts labs. On with consult.

## 2018-10-27 NOTE — H&P (Signed)
History and Physical    Patrick [name] PZW:[id number] DOB: [birth year] DOA: 10/27/2018  PCP: Nolene Ebbs, MD  Patient coming from: Home  I have personally briefly reviewed patient's old medical records in [place]  Chief Complaint: Shortness of breath and weakness  HPI: Patrick [name] is a 63 y.o. male with medical history significant of coronary artery disease status post coronary artery bypass graft, end-stage renal disease disease on peritoneal dialysis, diabetes type 2, diastolic congestive heart failure, elevated troponin, and hypertension who presents to the emergency department complaining of shortness of breath.  [place] 1 month ago patient was admitted to the hospital and found to have abdominal abscesses.  He had extensive work-up and was told he had chronic hematomas.  He was due to follow-up with multiple practitioners but as he has been doing well he did decline a couple of his follow-up appointments.  He has been compliant with his peritoneal dialysis every night.  He had an episode of vomiting yesterday.  This morning he states he felt very short of breath he was weak and tired but has had no chest pain he has had no cough, he denies any fevers or chills.  He has had no travel or sick contacts.  ED Course: Potassium found to be 2.8, calcium found to be 5.6,: An elevated at 0.48 twice his last measurement.  EKG unchanged from prior  Review of Systems: As per HPI otherwise all other systems reviewed and  negative.   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 [name].      Essential hypertension    Gout    Hernia of abdominal wall    History of blood transfusion    History of cardiomyopathy    History of kidney stones    History of pneumonia    Migraine    Myocardial infarction [facility])    1996   Secondary hyperparathyroidism ([place])    Type 2 diabetes mellitus ([place])    Wears glasses     Past Surgical History:  Procedure Laterality Date   A-FLUTTER ABLATION N/A 09/24/2016   Procedure: A-Flutter Ablation;  Surgeon: Will Meredith Leeds, MD;  Location: [place] [facility];  Service: Cardiovascular;  Laterality: N/A;   ABDOMINAL AORTOGRAM W/LOWER EXTREMITY N/A 04/13/2017   Procedure: ABDOMINAL AORTOGRAM W/LOWER EXTREMITY;  Surgeon: Conrad [place], MD;  Location: [place] [facility];  Service: Cardiovascular;  Laterality: N/A;  Bilater lower extermity   APPENDECTOMY     AV FISTULA PLACEMENT Right 09/18/2014   Procedure: INSERTION OF ARTERIOVENOUS (AV) GORE-TEX GRAFT ARM USING 4-7MM  X 45CM STRETCH GORE-TEX VASCULAR GRAFT;  Surgeon: Rosetta Posner, MD;  Location: [place];  Service: Vascular;  Laterality: Right;   AV FISTULA PLACEMENT Left 07/07/2016   Procedure: INSERTION OF LEFT BRACHIAL TO AXILLARY ARTERIOVENOUS (AV) GORE-TEX ARM GRAFT;  Surgeon: Conrad Cavalero, MD;  Location: [place];  Service: Vascular;  Laterality: Left;   CARDIAC CATHETERIZATION  ~ 2016   COLONOSCOPY     CORONARY ARTERY  BYPASS GRAFT  1997   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./notes 09/04/2005   EXCHANGE OF A DIALYSIS CATHETER Left 01/11/2018   Procedure: EXCHANGE OF TUNNELED DIALYSIS CATHETER;  Surgeon: Rosetta Posner, MD;  Location: [place];  Service: Vascular;  Laterality: Left;   HERNIA REPAIR  2017   with nephrectomy   INSERTION OF DIALYSIS CATHETER N/A 10/08/2017   Procedure: INSERTION OF TUNNELED DIALYSIS CATHETER;  Surgeon: Conrad Alvin, MD;  Location: [place];  Service: Vascular;  Laterality: N/A;   IR FLUORO GUIDE CV LINE LEFT  03/12/2018   IR  GENERIC HISTORICAL  05/11/2016   IR FLUORO GUIDE CV LINE LEFT 05/11/2016 Corrie Mckusick, DO [facility]   IR GENERIC HISTORICAL  05/11/2016   IR US GUIDE VASC ACCESS LEFT 05/11/2016 Corrie Mckusick, DO [facility]   IR GENERIC HISTORICAL  05/11/2016   IR US GUIDE VASC ACCESS RIGHT 05/11/2016 Corrie Mckusick, DO [facility]   IR GENERIC HISTORICAL  05/11/2016   IR RADIOLOGY PERIPHERAL GUIDED IV START 05/11/2016 Corrie Mckusick, DO [facility]   KIDNEY TRANSPLANT  2009   LEFT HEART CATH AND CORS/GRAFTS ANGIOGRAPHY N/A 12/04/2017   Procedure: LEFT HEART CATH AND CORS/GRAFTS ANGIOGRAPHY;  Surgeon: Belva Crome, MD;  Location: [place] [facility];  Service: Cardiovascular;  Laterality: N/A;   NEPHRECTOMY  2017   transplant rejected    PERIPHERAL VASCULAR CATHETERIZATION N/A 06/04/2016   Procedure: Upper Extremity Venography;  Surgeon: Waynetta Sandy, MD;  Location: [place] [facility];  Service: Cardiovascular;  Laterality: N/A;   PERIPHERAL VASCULAR INTERVENTION  04/13/2017   Procedure: PERIPHERAL VASCULAR INTERVENTION;  Surgeon: Conrad [place], MD;  Location: [place] [facility];  Service: Cardiovascular;;  Lt. Common/Exernal  Iliac   THROMBECTOMY AND REVISION OF ARTERIOVENTOUS (AV) GORETEX  GRAFT Left 10/08/2017   Procedure: THROMBECTOMY of ARTERIOVENTOUS (AV) GORETEX  GRAFT LEFT UPPER ARM;  Surgeon: Conrad [place], MD;  Location: [place];  Service: Vascular;  Laterality: Left;   THROMBECTOMY W/ EMBOLECTOMY Left 09/14/2017   Procedure: THROMBECTOMY ARTERIOVENOUS GORE-TEX GRAFT LEFT UPPER ARM;  Surgeon: Rosetta Posner, MD;  Location: [place];  Service: Vascular;  Laterality: Left;   UPPER EXTREMITY VENOGRAPHY N/A 11/16/2017   Procedure: UPPER EXTREMITY VENOGRAPHY - Right Arm;  Surgeon: Conrad [place], MD;  Location: [place] [facility];  Service: Cardiovascular;  Laterality: N/A;    Social History   Social History Narrative   Not on file     reports that he quit smoking about 4 years  ago. His smoking use included cigarettes. He has never used smokeless tobacco. He reports current alcohol use. He reports that he does not use drugs.  Allergies  Allergen Reactions   Iodinated Diagnostic Agents Swelling, Rash and Other (See Comments)    Other Reaction: burning to mouth, swelling of lips   Lipitor [Atorvastatin] Other (See Comments)    Leg pain   Metoprolol Other (See Comments)    Headaches    Baclofen Other (See Comments)    Possibly stroke like symptoms    Family History  Problem Relation Age of Onset   Hyperlipidemia Mother    Hypertension Mother    HIV Sister      Prior to Admission medications   Medication Sig Start Date End Date Taking? Authorizing Provider  acetaminophen (TYLENOL) 500 MG tablet Take 1,000 mg by mouth every 6 (six) hours as needed for moderate pain or headache.   Yes [provider]  albuterol (PROVENTIL HFA;VENTOLIN HFA) 108 (90 Base) MCG/ACT inhaler Inhale 1-2 puffs into the lungs every 6 (six) hours as needed for wheezing or shortness of breath. 12/18/17  Yes Weaver, Scott T, PA-C  amLODipine (NORVASC) 5 MG tablet Take 1 tablet (5 mg total) by mouth daily. 11/27/17 08/19/26 Yes Richardson Dopp T, PA-C  aspirin EC 81 MG tablet Take 81 mg by mouth daily.   Yes [provider]  bisoprolol (ZEBETA) 5 MG tablet Take 1 tablet (5 mg total) by mouth daily. 10/06/18  Yes Eugenie Filler, MD  calcium acetate (PHOSLO) 667 MG capsule Take 1,334 mg by mouth 3 (three) times daily with meals.    Yes [provider]  clopidogrel (PLAVIX) 75 MG tablet Take 1 tablet (75 mg total) by mouth daily. 12/18/17  Yes Weaver, Scott T, PA-C  Colchicine 0.6 MG CAPS Take 0.6 mg by mouth every 14 (fourteen) days. For 14 days 10/13/18  Yes [provider]  docusate sodium (COLACE) 100 MG capsule Take 100 mg by mouth daily as needed for mild constipation.   Yes [provider]  gentamicin cream (GARAMYCIN) 0.1 % Apply 1  application topically daily as needed (prior to accessing PD catheter).  09/28/18  Yes [provider]  isosorbide mononitrate (IMDUR) 60 MG 24 hr tablet Take 2 tablets (120 mg total) by mouth daily. 08/21/18  Yes Hongalgi, Lenis Dickinson, MD  multivitamin (RENA-VIT) TABS tablet Take 1 tablet by mouth daily.   Yes [provider]  repaglinide (PRANDIN) 0.5 MG tablet Take 1 tablet (0.5 mg total) by mouth 2 (two) times daily before a meal. 04/05/18  Yes Renato Shin, MD  rosuvastatin (CRESTOR) 10 MG tablet Take 1 tablet (10 mg total) by mouth daily at 6 PM. Patient taking differently: Take 10 mg by mouth daily.  08/21/18  Yes Modena Jansky, MD    Physical Exam:  Constitutional: NAD, calm, comfortable Vitals:   10/27/18 1510 10/27/18 1512 10/27/18 1515 10/27/18 1530  BP: 133/79 133/79 132/84 128/75  Pulse:  91    Resp: (!) 21 (!) 22 (!) 24 20  Temp:  99.4 F (37.4 C)    TempSrc:  Oral    SpO2:  96%     Eyes: PERRL, lids and conjunctivae normal ENMT: Mucous membranes are moist. Posterior pharynx clear of any exudate or lesions.Normal dentition.  Neck: normal, supple, no masses, no thyromegaly Respiratory: clear to auscultation bilaterally, no wheezing, no crackles. Normal respiratory effort. No accessory muscle use.  Cardiovascular: Regular rate and rhythm, no murmurs / rubs / gallops. No extremity edema.  No edema of the hips 2+ pedal pulses. No carotid bruits.  Abdomen: no tenderness, no masses palpated. No hepatosplenomegaly. Bowel sounds positive.  Increased abdominal girth Musculoskeletal: no clubbing / cyanosis. No joint deformity upper and lower extremities. Good ROM, no contractures. Normal muscle tone.  Skin: no rashes, lesions, ulcers. No induration Neurologic: CN 2-12 grossly intact. Sensation intact, DTR normal. Strength 5/5 in all 4.  Psychiatric: Normal judgment and insight. Alert and oriented x 3. Normal mood.    Labs on Admission: I have personally reviewed  following labs and imaging studies  CBC: Recent Labs  Lab 10/27/18 1511 10/27/18 1524  WBC 5.4  --   NEUTROABS 3.1  --   HGB 7.6* 8.5*  HCT 26.2* 25.0*  MCV 90.3  --   PLT 155  --    Basic Metabolic Panel: Recent Labs  Lab 10/27/18 1511 10/27/18 1524  NA 139 141  K 2.9* 2.9*  CL 99  --   CO2 23  --   GLUCOSE 95  --   BUN 47*  --   CREATININE 20.04* >18.00*  CALCIUM 5.8*  --    GFR: CrCl cannot be calculated (This lab value cannot be used to calculate CrCl because it is not a number: >18.00). Liver Function Tests: Recent Labs  Lab 10/27/18 1511  AST 54*  ALT 34  ALKPHOS 59  BILITOT 0.8  PROT 7.3  ALBUMIN 2.6*   Cardiac Enzymes: Recent Labs  Lab 10/27/18 1511  TROPONINI 0.48*   Urine analysis:    Component Value Date/Time   COLORURINE RED (A) 11/17/2015 0910   APPEARANCEUR TURBID (A) 11/17/2015 0910   LABSPEC 1.035 (H) 11/17/2015 0910   PHURINE 6.0 11/17/2015 0910   GLUCOSEU 100 (A) 11/17/2015 0910   HGBUR LARGE (A) 11/17/2015 0910   BILIRUBINUR LARGE (A) 11/17/2015 0910   KETONESUR 40 (A) 11/17/2015 0910   PROTEINUR >300 (A) 11/17/2015 0910   UROBILINOGEN 0.2 11/24/2013 0242   NITRITE POSITIVE (A) 11/17/2015 0910   LEUKOCYTESUR LARGE (A) 11/17/2015 0910    Radiological Exams on Admission: Dg Chest Port 1 View  Result Date: 10/27/2018 CLINICAL DATA:  Shortness of breath. EXAM: PORTABLE CHEST 1 VIEW COMPARISON:  Radiograph October 03, 2018. FINDINGS: Stable cardiomegaly. Left internal jugular dialysis catheter is unchanged in position. No pneumothorax is noted. Right lung is clear. Stable left basilar opacity is noted concerning for atelectasis and possible small effusion. Bony thorax is unremarkable. IMPRESSION: Stable left basilar opacity as described above. Electronically Signed   By: Marijo Conception, M.D.   On: 10/27/2018 15:26    EKG: Independently reviewed.  Sinus rhythm with left atrial enlargement, abnormal T waves, possible ischemia however when  compared to prior there was no change.  Assessment/Plan Principal Problem:   Acute on chronic diastolic CHF (congestive heart failure) (HCC) Active Problems:   Hypocalcemia   Dyspnea   Hypokalemia   Acute gout   End-stage renal failure with renal transplant ([place])   Renal transplant rejection   Elevated troponin   Essential hypertension   Secondary hyperparathyroidism ([place])   Anemia in chronic kidney disease   S/P CABG x 1   PAD (peripheral artery disease) ([place])    1.  Acute on chronic diastolic congestive heart failure: Likely related to increase in volume from ineffective peritoneal dialysis.  I have consulted Dr. Burnett Sheng and patient will receive peritoneal dialysis tonight.  Check daily weights and monitor results with labs in a.m.  We will continue oxygen supplementation PRN.  Patient with slightly elevated troponin appears to be related to strain.  I have ordered serial troponins and a repeat EKG in a.m.  Patient echocardiogram showed ejection fraction of 50 to 55% with normal wall motion and grade 1 diastolic dysfunction on August 20, 2018.  2.  Hypocalcemia: This will be repleted with 2 g of calcium gluconate over 2 hours.  We will repeat ionized calcium in a.m.  3.  Dyspnea: Likely related to acute on chronic diastolic congestive heart failure.  Reevaluate after dialysis.  4.  Hypokalemia: Potassium down to 2.9.  Discussed with Dr. Soyla Murphy who recommended 3 IV runs of potassium at 10 mEq each and then 30 of p.o. potassium with a recheck in a.m.  5.  Acute gout: We will continue patient's colchicine as his outpatient dosing.  6.  End-stage renal failure with renal transplant that failed.  Patient was  recently evaluated for a second transplant but he has some inoperable coronary disease and was felt not a good candidate.  We will continue peritoneal dialysis if that is not effective may require hemodialysis.  7.  Elevated troponin: We will cycle serial enzymes and check repeat  EKG in a.m.  I suspect that this is more a function of strain and poor renal clearance.  8.  Essential hypertension: Continue home medications.  9.  Secondary hyperparathyroidism: Continue Rena-Vite and home medications.  10.  Anemia and chronic kidney disease: Noted management per nephrology.  11.  Status post CABG x1 with coronary artery disease that is inoperable: Noted no evidence of cardiac issues at present time.  Patient denies any chest pain.  12.  Peripheral arterial disease: Monitor pulses and continue home medications.  DVT prophylaxis: Subcu heparin Code Status: Full code per discussion with the patient Family Communication: Was discussing with family when I came into the room. Disposition Plan: Home in 3 to 4 days Consults called: Nephrology Dr. Soyla Murphy Admission status: Inpatient   Lady Deutscher MD FACP [facility] Pager [phone]  How to contact the [facility] Attending or Consulting provider [place] or covering provider during after hours [place], for this patient?  1. Check the care team in [facility] and look for a) attending/consulting [facility] provider listed and b) the [facility] team listed 2. Log into [email or web address] and use [place]'s universal password to access. If you do not have the password, please contact the hospital operator. 3. Locate the [facility] provider you are looking for under [facility] and page to a number that you can be directly reached. 4. If you still have difficulty reaching the provider, please page the [facility] (Director on Call) for the Hospitalists listed on amion for assistance.  If 7PM-7AM, please contact night-coverage [email or web address] Password [facility]  10/27/2018, 4:54 PM

## 2018-10-27 NOTE — ED Provider Notes (Signed)
[facility]PART[facility] Provider Note   CSN: [id number] Arrival date & time: 10/27/18  1459    History   Chief Complaint Chief Complaint  Patient presents with   Shortness of Breath   Weakness    HPI Patrick [name] is a 63 y.o. male history of CAD status post CABG, ESRD on peritoneal dialysis, diabetes here presenting with shortness of breath.  Patient had a complicated admission about a month ago and had possible abdominal abscesses and had extensive work-up and was told that he had chronic hematomas.  Patient states that he has been compliant with his peritoneal dialysis every night.  Had an episode of vomiting yesterday.  This morning he states that he felt very short of breath and weak and tired.  Denies any chest pain. He denies any fevers or chills. No travel or sick contacts.      The history is provided by the patient.    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 [name] TTS.    Essential hypertension    Gout    Hernia of abdominal wall    History of blood transfusion    History of cardiomyopathy    History of kidney stones    History of pneumonia    Migraine    Myocardial infarction ([place])    1996   Secondary hyperparathyroidism ([place])    Type 2 diabetes mellitus ([place])    Wears glasses     Patient Active Problem List   Diagnosis Date Noted   SOB (shortness of breath) 10/02/2018   Peritoneal abscess ([place]) 10/01/2018   Nausea and vomiting 08/20/2018   Chronic anemia 05/12/2018   Hypokalemia 05/12/2018   Acute  combined systolic and diastolic congestive heart failure ([place]) 05/12/2018   Hyperlipidemia 05/12/2018   Physical deconditioning 05/12/2018   Multifocal pneumonia 05/11/2018   Allergy to IVP dye, subsequent encounter    CAD (coronary artery disease) 11/27/2017   Atherosclerosis of native arteries of extremity with intermittent claudication ([place]) 04/03/2017   PAD (peripheral artery disease) ([place]) 02/02/2017   Typical atrial flutter ([place]) 09/24/2016   PAF (paroxysmal atrial fibrillation) ([place]) 05/25/2016   ESRD (end stage renal disease) ([place]) 05/11/2016   Anemia of chronic disease    End-stage renal disease on hemodialysis ([place])    Renovascular hypertension    History of MI (myocardial infarction)    Acute respiratory failure with hypoxia ([place])    Controlled diabetes mellitus type 2 with complications (HCC)    Pleural effusion    Symptomatic anemia 01/22/2016   Acute respiratory failure ([place]) 01/22/2016   Chronic diastolic CHF (congestive heart failure) ([place]) 01/22/2016   Elevated troponin 01/22/2016   Febrile illness 01/22/2016   Chest pain 11/17/2015   Normocytic anemia 11/17/2015   Gynecomastia 11/17/2015   Renal stone 11/17/2015   Cough 11/17/2015   Dyspnea 10/30/2015   CAP (community acquired pneumonia)    HCAP (healthcare-associated pneumonia) 10/11/2015   Acute respiratory disease 10/11/2015   Protein-calorie malnutrition, severe ([place]) 09/16/2014   Kidney  transplant failure 12/75/1700   Metabolic acidosis, NAG, failure of bicarbonate regeneration 09/14/2014   Anemia in chronic kidney disease 11/24/2013   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   Hypocalcemia 11/24/2013   Low bicarbonate 11/24/2013   Essential hypertension 03/19/2013   Acute gout 03/19/2013   Impotence of organic origin 03/19/2013   Secondary hyperparathyroidism ([place]) 03/19/2013   End-stage renal failure with renal transplant ([place]) 03/19/2013   S/P CABG x 1  12/18/1995   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    Past Surgical History:  Procedure Laterality Date   A-FLUTTER ABLATION N/A 09/24/2016   Procedure: A-Flutter Ablation;  Surgeon: Will Meredith Leeds, MD;  Location: [place];  Service: Cardiovascular;  Laterality: N/A;   ABDOMINAL AORTOGRAM W/LOWER EXTREMITY N/A 04/13/2017   Procedure: ABDOMINAL AORTOGRAM W/LOWER EXTREMITY;  Surgeon: Conrad Wilmore, MD;  Location: [place] [facility];  Service: Cardiovascular;  Laterality: N/A;  Bilater lower extermity   APPENDECTOMY     AV FISTULA PLACEMENT Right 09/18/2014   Procedure: INSERTION OF ARTERIOVENOUS (AV) GORE-TEX GRAFT ARM USING 4-7MM  X 45CM STRETCH GORE-TEX VASCULAR GRAFT;  Surgeon: Rosetta Posner, MD;  Location: [facility];  Service: Vascular;  Laterality: Right;   AV FISTULA PLACEMENT Left 07/07/2016   Procedure: INSERTION OF LEFT BRACHIAL TO AXILLARY ARTERIOVENOUS (AV) GORE-TEX ARM GRAFT;  Surgeon: Conrad [place], MD;  Location: [facility];  Service: Vascular;  Laterality: Left;   CARDIAC CATHETERIZATION  ~ 2016   COLONOSCOPY     CORONARY ARTERY BYPASS GRAFT  1997   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./notes 09/04/2005   EXCHANGE OF A DIALYSIS CATHETER Left 01/11/2018   Procedure: EXCHANGE OF TUNNELED DIALYSIS CATHETER;  Surgeon: Rosetta Posner, MD;  Location: [facility];  Service: Vascular;  Laterality: Left;   HERNIA REPAIR  2017   with nephrectomy   INSERTION OF DIALYSIS CATHETER N/A 10/08/2017   Procedure: INSERTION OF TUNNELED DIALYSIS CATHETER;  Surgeon: Conrad [place], MD;  Location: [facility];  Service: Vascular;  Laterality: N/A;   IR FLUORO GUIDE CV LINE LEFT  03/12/2018   IR GENERIC HISTORICAL  05/11/2016   IR FLUORO GUIDE CV LINE LEFT 05/11/2016 Corrie Mckusick, DO [facility]   IR GENERIC HISTORICAL  05/11/2016   IR US GUIDE VASC ACCESS LEFT 05/11/2016 Corrie Mckusick, DO [facility]   IR GENERIC HISTORICAL  05/11/2016   IR US GUIDE VASC  ACCESS RIGHT 05/11/2016 Corrie Mckusick, DO [facility]   IR GENERIC HISTORICAL  05/11/2016   IR RADIOLOGY PERIPHERAL GUIDED IV START 05/11/2016 Corrie Mckusick, DO [facility]   KIDNEY TRANSPLANT  2009   LEFT HEART CATH AND CORS/GRAFTS ANGIOGRAPHY N/A 12/04/2017   Procedure: LEFT HEART CATH AND CORS/GRAFTS ANGIOGRAPHY;  Surgeon: Belva Crome, MD;  Location: [place] [facility];  Service: Cardiovascular;  Laterality: N/A;   NEPHRECTOMY  2017   transplant rejected    PERIPHERAL VASCULAR CATHETERIZATION N/A 06/04/2016   Procedure: Upper Extremity Venography;  Surgeon: Waynetta Sandy, MD;  Location: [facility];  Service: Cardiovascular;  Laterality: N/A;   PERIPHERAL VASCULAR INTERVENTION  04/13/2017   Procedure: PERIPHERAL VASCULAR INTERVENTION;  Surgeon: Conrad [place], MD;  Location: [facility];  Service: Cardiovascular;;  Lt. Common/Exernal  Iliac   THROMBECTOMY AND REVISION OF ARTERIOVENTOUS (AV) GORETEX  GRAFT Left 10/08/2017   Procedure: THROMBECTOMY of ARTERIOVENTOUS (AV) GORETEX  GRAFT LEFT UPPER ARM;  Surgeon: Conrad Shipman, MD;  Location: [facility]  OR;  Service: Vascular;  Laterality: Left;   THROMBECTOMY W/ EMBOLECTOMY Left 09/14/2017   Procedure: THROMBECTOMY ARTERIOVENOUS GORE-TEX GRAFT LEFT UPPER ARM;  Surgeon: Rosetta Posner, MD;  Location: [facility];  Service: Vascular;  Laterality: Left;   UPPER EXTREMITY VENOGRAPHY N/A 11/16/2017   Procedure: UPPER EXTREMITY VENOGRAPHY - Right Arm;  Surgeon: Conrad Churchill, MD;  Location: [place] [facility];  Service: Cardiovascular;  Laterality: N/A;        Home Medications    Prior to Admission medications   Medication Sig Start Date End Date Taking? Authorizing Provider  acetaminophen (TYLENOL) 500 MG tablet Take 1,000 mg by mouth every 6 (six) hours as needed for moderate pain or headache.   Yes [provider]  albuterol (PROVENTIL HFA;VENTOLIN HFA) 108 (90 Base) MCG/ACT inhaler Inhale 1-2 puffs into the lungs  every 6 (six) hours as needed for wheezing or shortness of breath. 12/18/17  Yes Weaver, Scott T, PA-C  amLODipine (NORVASC) 5 MG tablet Take 1 tablet (5 mg total) by mouth daily. 11/27/17 08/19/26 Yes Richardson Dopp T, PA-C  aspirin EC 81 MG tablet Take 81 mg by mouth daily.   Yes [provider]  bisoprolol (ZEBETA) 5 MG tablet Take 1 tablet (5 mg total) by mouth daily. 10/06/18  Yes Eugenie Filler, MD  calcium acetate (PHOSLO) 667 MG capsule Take 1,334 mg by mouth 3 (three) times daily with meals.    Yes [provider]  clopidogrel (PLAVIX) 75 MG tablet Take 1 tablet (75 mg total) by mouth daily. 12/18/17  Yes Weaver, Scott T, PA-C  Colchicine 0.6 MG CAPS Take 0.6 mg by mouth every 14 (fourteen) days. For 14 days 10/13/18  Yes [provider]  docusate sodium (COLACE) 100 MG capsule Take 100 mg by mouth daily as needed for mild constipation.   Yes [provider]  gentamicin cream (GARAMYCIN) 0.1 % Apply 1 application topically daily as needed (prior to accessing PD catheter).  09/28/18  Yes [provider]  isosorbide mononitrate (IMDUR) 60 MG 24 hr tablet Take 2 tablets (120 mg total) by mouth daily. 08/21/18  Yes Hongalgi, Lenis Dickinson, MD  multivitamin (RENA-VIT) TABS tablet Take 1 tablet by mouth daily.   Yes [provider]  repaglinide (PRANDIN) 0.5 MG tablet Take 1 tablet (0.5 mg total) by mouth 2 (two) times daily before a meal. 04/05/18  Yes Renato Shin, MD  rosuvastatin (CRESTOR) 10 MG tablet Take 1 tablet (10 mg total) by mouth daily at 6 PM. Patient taking differently: Take 10 mg by mouth daily.  08/21/18  Yes Hongalgi, Lenis Dickinson, MD  ezetimibe (ZETIA) 10 MG tablet Take 1 tablet (10 mg total) by mouth daily. Patient not taking: Reported on 10/27/2018 04/02/18 08/19/26  Liliane Shi, PA-C    Family History Family History  Problem Relation Age of Onset   Hyperlipidemia Mother    Hypertension Mother    HIV Sister     Social  History Social History   Tobacco Use   Smoking status: Former Smoker    Types: Cigarettes    Last attempt to quit: 07/04/2014    Years since quitting: 4.3   Smokeless tobacco: Never Used   Tobacco comment: "smoked ~ 1 pack/month when I did smoke; never a steady smoker"  Substance Use Topics   Alcohol use: Yes    Alcohol/week: 0.0 standard drinks    Comment: rare  "2 drinks, 1-2 times/year"   Drug use: No  Allergies   Iodinated diagnostic agents; Lipitor [atorvastatin]; Metoprolol; and Baclofen   Review of Systems Review of Systems  Respiratory: Positive for shortness of breath.   Neurological: Positive for weakness.  All other systems reviewed and are negative.    Physical Exam Updated Vital Signs BP 128/75    Pulse 91    Temp 99.4 F (37.4 C) (Oral)    Resp 20    SpO2 96%   Physical Exam Vitals signs and nursing note reviewed.  HENT:     Head: Normocephalic.  Eyes:     Extraocular Movements: Extraocular movements intact.     Pupils: Pupils are equal, round, and reactive to light.  Neck:     Musculoskeletal: Normal range of motion.  Cardiovascular:     Rate and Rhythm: Normal rate and regular rhythm.  Pulmonary:     Breath sounds: Wheezes:       Comments: Slightly tachypneic, crackles bilateral bases  Abdominal:     General: Bowel sounds are normal.     Palpations: Abdomen is soft.  Musculoskeletal: Normal range of motion.  Skin:    General: Skin is warm.     Capillary Refill: Capillary refill takes less than 2 seconds.  Neurological:     General: No focal deficit present.     Mental Status: He is alert.  Psychiatric:        Mood and Affect: Mood normal.        Behavior: Behavior normal.      ED Treatments / Results  Labs (all labs ordered are listed, but only abnormal results are displayed) Labs Reviewed  CBC WITH DIFFERENTIAL/PLATELET - Abnormal; Notable for the following components:      Result Value   RBC 2.90 (*)    Hemoglobin 7.6  (*)    HCT 26.2 (*)    MCHC 29.0 (*)    RDW 18.3 (*)    All other components within normal limits  COMPREHENSIVE METABOLIC PANEL - Abnormal; Notable for the following components:   Potassium 2.9 (*)    BUN 47 (*)    Creatinine, Ser 20.04 (*)    Calcium 5.8 (*)    Albumin 2.6 (*)    AST 54 (*)    GFR calc non Af Amer 2 (*)    GFR calc Af Amer 2 (*)    Anion gap 17 (*)    All other components within normal limits  TROPONIN I - Abnormal; Notable for the following components:   Troponin I 0.48 (*)    All other components within normal limits  I-STAT CREATININE, ED - Abnormal; Notable for the following components:   Creatinine, Ser >18.00 (*)    All other components within normal limits  POCT I-STAT EG7 - Abnormal; Notable for the following components:   pCO2, Ven 42.1 (*)    pO2, Ven 55.0 (*)    Potassium 2.9 (*)    Calcium, Ion 0.68 (*)    HCT 25.0 (*)    Hemoglobin 8.5 (*)    All other components within normal limits  BRAIN NATRIURETIC PEPTIDE    EKG EKG Interpretation  Date/Time:  Wednesday October 27 2018 15:11:36 EDT Ventricular Rate:  94 PR Interval:    QRS Duration: 99 QT Interval:  430 QTC Calculation: 538 R Axis:   38 Text Interpretation:  Sinus rhythm Probable left atrial enlargement Abnrm T, consider ischemia, anterolateral lds Prolonged QT interval No significant change since last tracing Confirmed by Wandra Arthurs [phone]) on 10/27/2018  3:32:08 PM   Radiology Dg Chest Port 1 View  Result Date: 10/27/2018 CLINICAL DATA:  Shortness of breath. EXAM: PORTABLE CHEST 1 VIEW COMPARISON:  Radiograph October 03, 2018. FINDINGS: Stable cardiomegaly. Left internal jugular dialysis catheter is unchanged in position. No pneumothorax is noted. Right lung is clear. Stable left basilar opacity is noted concerning for atelectasis and possible small effusion. Bony thorax is unremarkable. IMPRESSION: Stable left basilar opacity as described above. Electronically Signed   By: Marijo Conception, M.D.   On: 10/27/2018 15:26    Procedures Procedures (including critical care time)  CRITICAL CARE Performed by: Wandra Arthurs   Total critical care time: 30 minutes  Critical care time was exclusive of separately billable procedures and treating other patients.  Critical care was necessary to treat or prevent imminent or life-threatening deterioration.  Critical care was time spent personally by me on the following activities: development of treatment plan with patient and/or surrogate as well as nursing, discussions with consultants, evaluation of patient's response to treatment, examination of patient, obtaining history from patient or surrogate, ordering and performing treatments and interventions, ordering and review of laboratory studies, ordering and review of radiographic studies, pulse oximetry and re-evaluation of patient's condition.   Medications Ordered in ED Medications - No data to display   Initial Impression / Assessment and Plan / ED Course  I have reviewed the triage vital signs and the nursing notes.  Pertinent labs & imaging results that were available during my care of the patient were reviewed by me and considered in my medical decision making (see chart for details).       Patrick [name] is a 63 y.o. male here with SOB, weakness. Consider CHF exacerbation vs ACS vs electrolyte abnormality. He has peritoneal dialysis catheter with serosanguinous output and nontender abdomen and I doubt SBP. Will get labs, BNP, CXR.   4:22 PM He has multiple electrolyte abnormalities including hypokalemia, hypocalcemia, uremia, and elevated trop. I am concerned that he needs to have his dialysis setting adjusted. I talked to Dr. Melvia Heaps, who will see patient. Will admit for observation.     Final Clinical Impressions(s) / ED Diagnoses   Final diagnoses:  None    ED Discharge Orders    None       Drenda Freeze, MD 10/27/18 1625

## 2018-10-27 NOTE — Consult Note (Addendum)
[facility] Renal Consultation Note    Indication for Consultation:  Management of ESRD/hemodialysis; anemia, hypertension/volume and secondary hyperparathyroidism Requesting Physician:   HPI: Patrick [name] is a 63 y.o. 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  He had been doing HD until a recent transtion to [facility].  HE had a PD catheter placed by [name] 2/17 and a recent admission for seroma vs absccess 3/6-3/10  IR drained fluid.Culture was negative - he was discharged on Augmentin.  hgb at discharge was 9.1 (edw lowered 2 kg and also transfused 1 unit that admission) hgb at home HD unite was 9.8 3/12.  Today, he called his dialysis unit with complaints of SOB, N and V (just had yesterday during dialysis treatement then resolved) and ongoing fatigue, DOE and anorexia.    He said he had been using mostly all 2.5s with an occasional 1.5  Weight was 98.8 kg  He denied any blood in his stool.  He was advised to use all 2.5s tonight.  About 25 minutes after he called the dialysis unit  The patient's wife called to notify his dialysis RN that he was going to the ED via EMS.  Evaluation in the ED shows Na 139 K 2.9 CO2 23 BUN 47 Cr 20 Ca low at 5.8 (remove parathyroidectomy ) alb 2.6 AST 54 ALT 34  Top 0.48 WBC 5.4 hgb 7.6 plts 155 normal diff. CXR  showed stable left basilar opacity. Temp elevated at 99.4 resp. Slightly elevated sats ok BP stable. When seen by me he is on room air with O2 sat of 96%.  Most SOB is DOE without cough  He has no belly pain, does not urinate, bowels are moving well. He has been doing his own dialysis catheter dressing changes.  He has been staying home except driving his daughter to the store and he has stayed in the care.  He is frustrated by feeling bad and thinks he may have to go back to HD.  He offered to do HD tonight for a quicker fix to his  electrolyte abnormalities.    Past Medical History:  Diagnosis Date  . Arthritis   . Atrial flutter ([place])    Ablation 2018  . 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 Erectile dysfunction   . ESRD (end stage renal disease) on dialysis ([place])    Pt on HD 2003 >> transplanted in 2009, back on HD in 2016. [name].   . Essential hypertension   . Gout   . Hernia of abdominal wall   . History of blood transfusion   . History of cardiomyopathy   . History of kidney stones   . History of pneumonia   . Migraine   . Myocardial infarction ([place])    1996  . Secondary hyperparathyroidism ([place])   . Type 2 diabetes mellitus ([place])   . Wears glasses    Past Surgical History:  Procedure Laterality Date  . A-FLUTTER ABLATION N/A 09/24/2016   Procedure: A-Flutter Ablation;  Surgeon: Will Meredith Leeds, MD;  Location: [place] [facility];  Service: Cardiovascular;  Laterality: N/A;  . ABDOMINAL AORTOGRAM W/LOWER EXTREMITY N/A 04/13/2017   Procedure: ABDOMINAL AORTOGRAM W/LOWER  EXTREMITY;  Surgeon: Conrad Summerville, MD;  Location: [place] [facility];  Service: Cardiovascular;  Laterality: N/A;  Bilater lower extermity  . APPENDECTOMY    . AV FISTULA PLACEMENT Right 09/18/2014   Procedure: INSERTION OF ARTERIOVENOUS (AV) GORE-TEX GRAFT ARM USING 4-7MM  X 45CM STRETCH GORE-TEX VASCULAR GRAFT;  Surgeon: Rosetta Posner, MD;  Location: [place];  Service: Vascular;  Laterality: Right;  . AV FISTULA PLACEMENT Left 07/07/2016   Procedure: INSERTION OF LEFT BRACHIAL TO AXILLARY ARTERIOVENOUS (AV) GORE-TEX ARM GRAFT;  Surgeon: Conrad Bismarck, MD;  Location: [place];  Service: Vascular;  Laterality: Left;  . CARDIAC CATHETERIZATION  ~ 2016  . COLONOSCOPY    . CORONARY ARTERY BYPASS GRAFT  1997   for an anomalous coronary artery with an interarterial  course./notes 09/04/2005  . EXCHANGE OF A DIALYSIS CATHETER Left 01/11/2018   Procedure: EXCHANGE OF TUNNELED DIALYSIS CATHETER;  Surgeon: Rosetta Posner, MD;  Location: [place];  Service: Vascular;  Laterality: Left;  . HERNIA REPAIR  2017   with nephrectomy  . INSERTION OF DIALYSIS CATHETER N/A 10/08/2017   Procedure: INSERTION OF TUNNELED DIALYSIS CATHETER;  Surgeon: Conrad [place], MD;  Location: [place];  Service: Vascular;  Laterality: N/A;  . IR FLUORO GUIDE CV LINE LEFT  03/12/2018  . IR GENERIC HISTORICAL  05/11/2016   IR FLUORO GUIDE CV LINE LEFT 05/11/2016 Corrie Mckusick, DO [facility]  . IR GENERIC HISTORICAL  05/11/2016   IR US GUIDE VASC ACCESS LEFT 05/11/2016 Corrie Mckusick, DO [facility]  . IR GENERIC HISTORICAL  05/11/2016   IR US GUIDE VASC ACCESS RIGHT 05/11/2016 Corrie Mckusick, DO [facility]  . IR GENERIC HISTORICAL  05/11/2016   IR RADIOLOGY PERIPHERAL GUIDED IV START 05/11/2016 Corrie Mckusick, DO [facility]  . KIDNEY TRANSPLANT  2009  . LEFT HEART CATH AND CORS/GRAFTS ANGIOGRAPHY N/A 12/04/2017   Procedure: LEFT HEART CATH AND CORS/GRAFTS ANGIOGRAPHY;  Surgeon: Belva Crome, MD;  Location: [place] [facility];  Service: Cardiovascular;  Laterality: N/A;  . NEPHRECTOMY  2017   transplant rejected   . PERIPHERAL VASCULAR CATHETERIZATION N/A 06/04/2016   Procedure: Upper Extremity Venography;  Surgeon: Waynetta Sandy, MD;  Location: [place] [facility];  Service: Cardiovascular;  Laterality: N/A;  . PERIPHERAL VASCULAR INTERVENTION  04/13/2017   Procedure: PERIPHERAL VASCULAR INTERVENTION;  Surgeon: Conrad Cresson, MD;  Location: [place] [facility];  Service: Cardiovascular;;  Lt. Common/Exernal  Iliac  . THROMBECTOMY AND REVISION OF ARTERIOVENTOUS (AV) GORETEX  GRAFT Left 10/08/2017   Procedure: THROMBECTOMY of ARTERIOVENTOUS (AV) GORETEX  GRAFT LEFT UPPER ARM;  Surgeon: Conrad [place], MD;  Location: [place];  Service: Vascular;  Laterality: Left;  . THROMBECTOMY W/  EMBOLECTOMY Left 09/14/2017   Procedure: THROMBECTOMY ARTERIOVENOUS GORE-TEX GRAFT LEFT UPPER ARM;  Surgeon: Rosetta Posner, MD;  Location: [place];  Service: Vascular;  Laterality: Left;  . UPPER EXTREMITY VENOGRAPHY N/A 11/16/2017   Procedure: UPPER EXTREMITY VENOGRAPHY - Right Arm;  Surgeon: Conrad Cheraw, MD;  Location: [place] [facility];  Service: Cardiovascular;  Laterality: N/A;   Family History  Problem Relation Age of Onset  . Hyperlipidemia Mother   . Hypertension Mother   . HIV Sister    Social History:  reports that he quit smoking about 4 years ago. His smoking use included cigarettes. He has never used smokeless tobacco. He reports current alcohol use. He reports that he does not use drugs. Allergies  Allergen Reactions  . Iodinated Diagnostic Agents Swelling, Rash and Other (See Comments)    Other Reaction: burning to mouth, swelling of lips  . Lipitor [Atorvastatin] Other (See Comments)    Leg pain  . Metoprolol Other (See Comments)    Headaches   . Baclofen Other (See Comments)    Possibly stroke like symptoms   Prior to Admission medications   Medication Sig Start Date End Date Taking? Authorizing Provider  acetaminophen (TYLENOL) 500 MG tablet Take 1,000 mg by mouth every 6 (six) hours as needed for moderate pain or headache.   Yes [provider]  albuterol (PROVENTIL HFA;VENTOLIN HFA) 108 (90 Base) MCG/ACT inhaler Inhale 1-2 puffs into the lungs every 6 (six) hours as needed for wheezing or shortness of breath. 12/18/17  Yes Weaver, Scott T, PA-C  amLODipine (NORVASC) 5 MG tablet Take 1 tablet (5 mg total) by mouth daily. 11/27/17 08/19/26 Yes Richardson Dopp T, PA-C  aspirin EC 81 MG tablet Take 81 mg by mouth daily.   Yes [provider]  bisoprolol (ZEBETA) 5 MG tablet Take 1 tablet (5 mg total) by mouth daily. 10/06/18  Yes Eugenie Filler, MD  calcium acetate (PHOSLO) 667 MG capsule Take 1,334 mg by mouth 3 (three) times daily with meals.    Yes  [provider]  clopidogrel (PLAVIX) 75 MG tablet Take 1 tablet (75 mg total) by mouth daily. 12/18/17  Yes Weaver, Scott T, PA-C  Colchicine 0.6 MG CAPS Take 0.6 mg by mouth every 14 (fourteen) days. For 14 days 10/13/18  Yes [provider]  docusate sodium (COLACE) 100 MG capsule Take 100 mg by mouth daily as needed for mild constipation.   Yes [provider]  gentamicin cream (GARAMYCIN) 0.1 % Apply 1 application topically daily as needed (prior to accessing PD catheter).  09/28/18  Yes [provider]  isosorbide mononitrate (IMDUR) 60 MG 24 hr tablet Take 2 tablets (120 mg total) by mouth daily. 08/21/18  Yes Hongalgi, Lenis Dickinson, MD  multivitamin (RENA-VIT) TABS tablet Take 1 tablet by mouth daily.   Yes [provider]  repaglinide (PRANDIN) 0.5 MG tablet Take 1 tablet (0.5 mg total) by mouth 2 (two) times daily before a meal. 04/05/18  Yes Renato Shin, MD  rosuvastatin (CRESTOR) 10 MG tablet Take 1 tablet (10 mg total) by mouth daily at 6 PM. Patient taking differently: Take 10 mg by mouth daily.  08/21/18  Yes Hongalgi, Lenis Dickinson, MD  ezetimibe (ZETIA) 10 MG tablet Take 1 tablet (10 mg total) by mouth daily. Patient not taking: Reported on 10/27/2018 04/02/18 08/19/26  Richardson Dopp T, PA-C   No current facility-administered medications for this encounter.    Current Outpatient Medications  Medication Sig Dispense Refill  . acetaminophen (TYLENOL) 500 MG tablet Take 1,000 mg by mouth every 6 (six) hours as needed for moderate pain or headache.    . albuterol (PROVENTIL HFA;VENTOLIN HFA) 108 (90 Base) MCG/ACT inhaler Inhale 1-2 puffs into the lungs every 6 (six) hours as needed for wheezing or shortness of breath. 1 Inhaler 0  . amLODipine (NORVASC) 5 MG tablet Take 1 tablet (5 mg total) by mouth daily. 90 tablet 3  . aspirin EC 81 MG tablet Take 81 mg by mouth daily.    . bisoprolol (ZEBETA) 5 MG tablet Take 1 tablet (5 mg total) by mouth daily. 30 tablet  1  . calcium acetate (PHOSLO) 667 MG capsule Take 1,334 mg by mouth 3 (three)  times daily with meals.     . clopidogrel (PLAVIX) 75 MG tablet Take 1 tablet (75 mg total) by mouth daily. 90 tablet 3  . Colchicine 0.6 MG CAPS Take 0.6 mg by mouth every 14 (fourteen) days. For 14 days    . docusate sodium (COLACE) 100 MG capsule Take 100 mg by mouth daily as needed for mild constipation.    Marland Kitchen gentamicin cream (GARAMYCIN) 0.1 % Apply 1 application topically daily as needed (prior to accessing PD catheter).     . isosorbide mononitrate (IMDUR) 60 MG 24 hr tablet Take 2 tablets (120 mg total) by mouth daily. 60 tablet 0  . multivitamin (RENA-VIT) TABS tablet Take 1 tablet by mouth daily.    . repaglinide (PRANDIN) 0.5 MG tablet Take 1 tablet (0.5 mg total) by mouth 2 (two) times daily before a meal. 180 tablet 3  . rosuvastatin (CRESTOR) 10 MG tablet Take 1 tablet (10 mg total) by mouth daily at 6 PM. (Patient taking differently: Take 10 mg by mouth daily. ) 30 tablet 0  . ezetimibe (ZETIA) 10 MG tablet Take 1 tablet (10 mg total) by mouth daily. (Patient not taking: Reported on 10/27/2018) 90 tablet 3   Labs: Basic Metabolic Panel: Recent Labs  Lab 10/27/18 1511 10/27/18 1524  NA 139 141  K 2.9* 2.9*  CL 99  --   CO2 23  --   GLUCOSE 95  --   BUN 47*  --   CREATININE 20.04* >18.00*  CALCIUM 5.8*  --    Liver Function Tests: Recent Labs  Lab 10/27/18 1511  AST 54*  ALT 34  ALKPHOS 59  BILITOT 0.8  PROT 7.3  ALBUMIN 2.6*   CBC: Recent Labs  Lab 10/27/18 1511 10/27/18 1524  WBC 5.4  --   NEUTROABS 3.1  --   HGB 7.6* 8.5*  HCT 26.2* 25.0*  MCV 90.3  --   PLT 155  --    Cardiac Enzymes: Recent Labs  Lab 10/27/18 1511  TROPONINI 0.48*   CBG: No results for input(s): GLUCAP in the last 168 hours. Iron Studies: No results for input(s): IRON, TIBC, TRANSFERRIN, FERRITIN in the last 72 hours. Studies/Results: Dg Chest Port 1 View  Result Date: 10/27/2018 CLINICAL DATA:   Shortness of breath. EXAM: PORTABLE CHEST 1 VIEW COMPARISON:  Radiograph October 03, 2018. FINDINGS: Stable cardiomegaly. Left internal jugular dialysis catheter is unchanged in position. No pneumothorax is noted. Right lung is clear. Stable left basilar opacity is noted concerning for atelectasis and possible small effusion. Bony thorax is unremarkable. IMPRESSION: Stable left basilar opacity as described above. Electronically Signed   By: Marijo Conception, M.D.   On: 10/27/2018 15:26    ROS: As per HPI otherwise negative.  Physical Exam: Vitals:   10/27/18 1510 10/27/18 1512 10/27/18 1515 10/27/18 1530  BP: 133/79 133/79 132/84 128/75  Pulse:  91    Resp: (!) 21 (!) 22 (!) 24 20  Temp:  99.4 F (37.4 C)    TempSrc:  Oral    SpO2:  96%       General: WDWN NAD slightly anxious  Head: NCAT sclera not icteric MMM Neck: Supple.  Lungs: crackles at bases no wheezes. Breathing is unlabored. Heart: RRR with S1 S2.  Abdomen: obese soft NT + BS PD cath exit clean Lower extremities:without edema or ischemic changes, no open wounds  Neuro: A & O  X 3. Moves all extremities spontaneously. Psych:  Responds to questions appropriately  with  Usual affect. Dialysis Access: left IJ TDC no drainage on dressing - suture intact  PD Cath  Dialysis Orders: PD  98 6 exchanges 2.5 fill dwel 1.5 hr dry day -  no VDRA last Mircera 50 3/3 13% sat ferritin 1546   Assessment/Plan: 1. SOB - has been an ongoing theme;  No sig volume on CXR.  Initial trop 0.48 - plan UF additional volume on HD; could have component of COPD/hx prior smoker. 2. Low grade temp - per primary/ will see if any change occurs on dialysis.  At risk for infection due to [facility] and patient doing his OWN dressing changes; no overt COVID risks; check cell count  In am  3.  Electrolyte imbalance - supplement IV K, IV Ca - using added K bath on dialysis - when he was on HD, he used an added Ca bath but low K obviates this.  Resume oral Ca and give one  time dose of calctiriol - recheck in am 4. N and V - possible in part due to uremia from ^ Cr BUN not that high but could be due to poor intake - baseline Cr while on HD was about 13. 5. ESRD -  Recent transition to [facility] - last HD 3/5; Cr markedly elevated - ? Adequate clearances.-Plan HD this evening with electrolyte modifications as above 6. Hypertension/volume  - BP ok but needs volume down more  7. Anemia  - acute drop - could in part be ESA deficit/ chronic low Fe with elevated Fe - need to repeat Fe stores- give ESA today - likely needs more Fe- would check FOBT 8. Metabolic bone disease -  Acute hypocalcemia - significantly below baseline - possibly due to N, V and inability to take Ca based binders- on 3 Ca acetate ac- correcting as above - hx of remote parathyroidectomy so prone to low Ca levels 9. Nutrition - liberalize to heart healthy carb mod to ^ K intake 10. CAD hx MI/CABG - trop ^ f/u per primary 11. DM - per primary  Myriam Jacobson, PA-C [place] [phone] 10/27/2018, 4:21 PM   Pt seen, examined and agree w A/P as above.  [facility] 10/28/2018, 8:09 AM

## 2018-10-27 NOTE — ED Notes (Signed)
Dr. Darl Householder aware of pts I-stat results.

## 2018-10-27 NOTE — ED Notes (Signed)
Dr. Yao at bedside. 

## 2018-10-28 ENCOUNTER — Encounter (HOSPITAL_COMMUNITY): Payer: Self-pay | Admitting: General Practice

## 2018-10-28 ENCOUNTER — Other Ambulatory Visit: Payer: Self-pay

## 2018-10-28 DIAGNOSIS — Z992 Dependence on renal dialysis: Secondary | ICD-10-CM

## 2018-10-28 DIAGNOSIS — R7989 Other specified abnormal findings of blood chemistry: Secondary | ICD-10-CM

## 2018-10-28 DIAGNOSIS — I5033 Acute on chronic diastolic (congestive) heart failure: Secondary | ICD-10-CM

## 2018-10-28 DIAGNOSIS — T82898A Other specified complication of vascular prosthetic devices, implants and grafts, initial encounter: Secondary | ICD-10-CM

## 2018-10-28 DIAGNOSIS — N186 End stage renal disease: Secondary | ICD-10-CM

## 2018-10-28 LAB — CBC
HCT: 26.2 % — ABNORMAL LOW (ref 39.0–52.0)
Hemoglobin: 7.9 g/dL — ABNORMAL LOW (ref 13.0–17.0)
MCH: 27.2 pg (ref 26.0–34.0)
MCHC: 30.2 g/dL (ref 30.0–36.0)
MCV: 90.3 fL (ref 80.0–100.0)
Platelets: 160 10*3/uL (ref 150–400)
RBC: 2.9 MIL/uL — ABNORMAL LOW (ref 4.22–5.81)
RDW: 18.3 % — ABNORMAL HIGH (ref 11.5–15.5)
WBC: 4.8 10*3/uL (ref 4.0–10.5)
nRBC: 0 % (ref 0.0–0.2)

## 2018-10-28 LAB — TROPONIN I: Troponin I: 1.13 ng/mL (ref <0.03)

## 2018-10-28 LAB — MRSA PCR SCREENING: MRSA by PCR: NEGATIVE

## 2018-10-28 MED ORDER — POTASSIUM CHLORIDE 10 MEQ/100ML IV SOLN
10.0000 meq | INTRAVENOUS | Status: AC
  Administered 2018-10-28 (×3): 10 meq via INTRAVENOUS
  Filled 2018-10-28 (×3): qty 100

## 2018-10-28 MED ORDER — HEPARIN SODIUM (PORCINE) 1000 UNIT/ML IJ SOLN
INTRAMUSCULAR | Status: AC
  Administered 2018-10-28: 4200 [IU] via INTRAVENOUS
  Filled 2018-10-28: qty 5

## 2018-10-28 MED ORDER — CHLORHEXIDINE GLUCONATE CLOTH 2 % EX PADS: 6.0000 | MEDICATED_PAD | Freq: Every day | CUTANEOUS | Status: DC

## 2018-10-28 NOTE — Progress Notes (Signed)
Received report from 1st shift nurse.  Noticed patient did not take his medications from lunch (phosolo and tylenol).   Per pt was going to take it with dinner.

## 2018-10-28 NOTE — Consult Note (Signed)
Cardiology Consultation:   Patient ID: Patrick [name] MRN: [id number]; DOB: 03/28/1956  Admit date: 10/27/2018 Date of Consult: 10/28/2018  Primary Care Provider: Nolene Ebbs, MD Primary Cardiologist: Dorris Carnes, MD  Primary Electrophysiologist:  Constance Haw, MD   Patient Profile:   Patrick [name] is a 63 y.o. male with a hx of diabetes mellitus, coronary artery disease status post coronary artery bypass and graft, end-stage renal disease on peritoneal dialysis, hypertension, peripheral vascular disease, atrial flutter status post ablation who is being seen today for the evaluation of elevated troponin at the request of Dana Allan MD.  History of Present Illness:   Patient is status post coronary artery bypass and graft in 1997.  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  Cardiac catheterization May 2019 showed an occluded proximal LAD with left to left and right to left collaterals, 50% ostial D1, 50% OM1, 50% proximal RCA, 60% mid RCA and atretic LIMA to the LAD.  Medical therapy recommended.  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  Echocardiogram January 2020 showed normal LV function, mild diastolic dysfunction, mild aortic stenosis with mean gradient 11 mmHg and trace aortic insufficiency.  Recently discharged following admission for abdominal pain and was found to have peritoneal abscess versus seroma.  Troponin was noted to be elevated at 0.32 and 0.43.  Patient was recently started on peritoneal dialysis 2 weeks ago.  He was previously on hemodialysis.  Since that time he describes weakness which has been worse over the past 4 days.  He also has dyspnea on exertion.  No orthopnea, PND or pedal edema.  He denies chest pain or syncope.  No fevers, chills, productive cough, hemoptysis.  Past Medical History:  Diagnosis Date  . Arthritis   . Atrial flutter ([place])    Ablation  2018  . 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 Erectile dysfunction   . ESRD (end stage renal disease) on dialysis ([place])    Pt on HD 2003 >> transplanted in 2009, back on HD in 2016. [name].   . Essential hypertension   . Gout   . Hernia of abdominal wall   . History of blood transfusion   . History of cardiomyopathy   . History of kidney stones   . History of pneumonia   . Migraine   . Myocardial infarction ([place])    1996  . Secondary hyperparathyroidism ([place])   . Type 2 diabetes mellitus ([place])   . Wears glasses     Past Surgical History:  Procedure Laterality Date  . A-FLUTTER ABLATION N/A 09/24/2016   Procedure: A-Flutter Ablation;  Surgeon: Will Meredith Leeds, MD;  Location: [facility];  Service: Cardiovascular;  Laterality: N/A;  . ABDOMINAL AORTOGRAM W/LOWER EXTREMITY N/A 04/13/2017   Procedure: ABDOMINAL AORTOGRAM W/LOWER EXTREMITY;  Surgeon: Conrad Bonaparte, MD;  Location: [facility];  Service: Cardiovascular;  Laterality: N/A;  Bilater lower extermity  . APPENDECTOMY    . AV FISTULA PLACEMENT Right 09/18/2014   Procedure: INSERTION OF ARTERIOVENOUS (AV) GORE-TEX GRAFT ARM USING 4-7MM  X 45CM STRETCH GORE-TEX VASCULAR GRAFT;  Surgeon: Rosetta Posner, MD;  Location[facility];  Service: Vascular;  Laterality: Right;  . AV FISTULA  PLACEMENT Left 07/07/2016   Procedure: INSERTION OF LEFT BRACHIAL TO AXILLARY ARTERIOVENOUS (AV) GORE-TEX ARM GRAFT;  Surgeon: Conrad Pajaro, MD;  Location: [place];  Service: Vascular;  Laterality: Left;  . CARDIAC CATHETERIZATION  ~ 2016  . COLONOSCOPY    . 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 EXCHANGE OF A DIALYSIS CATHETER Left 01/11/2018   Procedure: EXCHANGE OF TUNNELED DIALYSIS  CATHETER;  Surgeon: Rosetta Posner, MD;  Location: [place];  Service: Vascular;  Laterality: Left;  . HERNIA REPAIR  2017   with nephrectomy  . INSERTION OF DIALYSIS CATHETER N/A 10/08/2017   Procedure: INSERTION OF TUNNELED DIALYSIS CATHETER;  Surgeon: Conrad [place], MD;  Location: [place];  Service: Vascular;  Laterality: N/A;  . IR FLUORO GUIDE CV LINE LEFT  03/12/2018  . IR GENERIC HISTORICAL  05/11/2016   IR FLUORO GUIDE CV LINE LEFT 05/11/2016 Corrie Mckusick, DO [facility]  . IR GENERIC HISTORICAL  05/11/2016   IR US GUIDE VASC ACCESS LEFT 05/11/2016 Corrie Mckusick, DO [facility]  . IR GENERIC HISTORICAL  05/11/2016   IR US GUIDE VASC ACCESS RIGHT 05/11/2016 Corrie Mckusick, DO [facility]  . IR GENERIC HISTORICAL  05/11/2016   IR RADIOLOGY PERIPHERAL GUIDED IV START 05/11/2016 Corrie Mckusick, DO [facility]  . KIDNEY TRANSPLANT  2009  . LEFT HEART CATH AND CORS/GRAFTS ANGIOGRAPHY N/A 12/04/2017   Procedure: LEFT HEART CATH AND CORS/GRAFTS ANGIOGRAPHY;  Surgeon: Belva Crome, MD;  Location: [facility];  Service: Cardiovascular;  Laterality: N/A;  . NEPHRECTOMY  2017   transplant rejected   . PERIPHERAL VASCULAR CATHETERIZATION N/A 06/04/2016   Procedure: Upper Extremity Venography;  Surgeon: Waynetta Sandy, MD;  Location: [facility];  Service: Cardiovascular;  Laterality: N/A;  . PERIPHERAL VASCULAR INTERVENTION  04/13/2017   Procedure: PERIPHERAL VASCULAR INTERVENTION;  Surgeon: Conrad Holdrege, MD;  Location: [facility];  Service: Cardiovascular;;  Lt. Common/Exernal  Iliac  . THROMBECTOMY AND REVISION OF ARTERIOVENTOUS (AV) GORETEX  GRAFT Left 10/08/2017   Procedure: THROMBECTOMY of ARTERIOVENTOUS (AV) GORETEX  GRAFT LEFT UPPER ARM;  Surgeon: Conrad [place], MD;  Location: [facility];  Service: Vascular;  Laterality: Left;  . THROMBECTOMY W/ EMBOLECTOMY Left 09/14/2017   Procedure: THROMBECTOMY ARTERIOVENOUS GORE-TEX GRAFT LEFT UPPER ARM;  Surgeon: Rosetta Posner,  MD;  Location: [place];  Service: Vascular;  Laterality: Left;  . UPPER EXTREMITY VENOGRAPHY N/A 11/16/2017   Procedure: UPPER EXTREMITY VENOGRAPHY - Right Arm;  Surgeon: Conrad [place], MD;  Location: [facility];  Service: Cardiovascular;  Laterality: N/A;      Inpatient Medications: Scheduled Meds: . amLODipine  5 mg Oral Daily  . aspirin EC  81 mg Oral Daily  . bisoprolol  5 mg Oral Daily  . calcium acetate  1,334 mg Oral TID WC  . Chlorhexidine Gluconate Cloth  6 each Topical Q0600  . clopidogrel  75 mg Oral Daily  . [START ON 11/06/2018] colchicine  0.6 mg Oral Q14 Days  . heparin  1,000 Units Intravenous Q T,Th,Sa-HD  . heparin  5,000 Units Subcutaneous Q8H  . isosorbide mononitrate  120 mg Oral Daily  . multivitamin  1 tablet Oral Daily  . repaglinide  0.5 mg Oral BID AC  . rosuvastatin  10 mg Oral q1800  . sodium chloride flush  3 mL Intravenous Q12H   Continuous Infusions:  PRN Meds: acetaminophen, albuterol, docusate sodium, gentamicin cream, ondansetron **OR** ondansetron (ZOFRAN) IV, polyethylene glycol, zolpidem  Allergies:    Allergies  Allergen Reactions  . Iodinated Diagnostic Agents Swelling, Rash and Other (See Comments)    Other Reaction: burning to mouth, swelling of lips  . Lipitor [Atorvastatin] Other (See Comments)    Leg pain  . Metoprolol Other (See Comments)    Headaches   . Baclofen Other (See Comments)    Possibly stroke like symptoms    Social History:   Social History   Socioeconomic History  . Marital status: Married    Spouse name: SHEILA  . Number of children: 5  . Years of education: 59  . Highest education level: 12th grade  Occupational History  . Occupation: DISABLED  Social Needs  . Financial resource strain: Not on file  . Food insecurity:    Worry: Never true    Inability: Never true  . Transportation needs:    Medical: No    Non-medical: No  Tobacco Use  . Smoking status: Former Smoker    Types: Cigarettes     Last attempt to quit: 07/04/2014    Years since quitting: 4.3  . Smokeless tobacco: Never Used  . Tobacco comment: "smoked ~ 1 pack/month when I did smoke; never a steady smoker"  Substance and Sexual Activity  . Alcohol use: Yes    Alcohol/week: 0.0 standard drinks    Comment: rare  "2 drinks, 1-2 times/year"  . Drug use: No  . Sexual activity: Not Currently    Birth control/protection: None  Lifestyle  . Physical activity:    Days per week: 0 days    Minutes per session: 0 min  . Stress: Not at all  Relationships  . Social connections:    Talks on phone: More than three times a week    Gets together: More than three times a week    Attends religious service: Never    Active member of club or organization: No    Attends meetings of clubs or organizations: Never    Relationship status: Married  . Intimate partner violence:    Fear of current or ex partner: No    Emotionally abused: No    Physically abused: No    Forced sexual activity: No  Other Topics Concern  . Not on file  Social History Narrative  . Not on file    Family History:    Family History  Problem Relation Age of Onset  . Hyperlipidemia Mother   . Hypertension Mother   . HIV Sister      ROS:  Please see the history of present illness.  Generalized weakness and malaise but no fevers, chills, productive cough or diarrhea. All other ROS reviewed and negative.     Physical Exam/Data:   Vitals:   10/28/18 0435 10/28/18 0538 10/28/18 0819 10/28/18 1133  BP: 135/83  111/76 115/75  Pulse: 90  89 78  Resp: 18   16  Temp: 98.9 F (37.2 C)  99.2 F (37.3 C) 99.7 F (37.6 C)  TempSrc: Oral  Oral Oral  SpO2: 100%   100%  Weight:  94.8 kg    Height:        Intake/Output Summary (Last 24 hours) at 10/28/2018 1211 Last data filed at 10/28/2018 0744 Gross per 24 hour  Intake 780 ml  Output 3000 ml  Net -2220 ml   Last 3 Weights 10/28/2018 10/27/2018 10/27/2018  Weight (lbs) 208 lb 14.4 oz  208 lb 5.4 oz 215 lb  9.8 oz  Weight (kg) 94.756 kg 94.5 kg 97.8 kg     Body mass index is 33.72 kg/m.  General:  Well nourished, well developed, in no acute distress HEENT: normal Neck: no JVD Endocrine:  No thryomegaly Vascular: No carotid bruits Cardiac:  normal S1, S2; RRR; 2/6 systolic murmur left sternal border. Lungs: Minimal basilar crackles Abd: Mild tenderness to palpation, small density right lower abdomen, peritoneal dialysis catheter in place Ext: no edema Musculoskeletal:  No deformities, BUE and BLE strength normal and equal Skin: warm and dry  Neuro:  CNs 2-12 intact, no focal abnormalities noted Psych:  Normal affect   EKG:  The EKG was personally reviewed and demonstrates: Sinus rhythm, lateral T wave inversion, prolonged QT interval; in comparison to previous electrocardiograms lateral T wave inversion has been intermittently present in the past. Telemetry:  Telemetry was personally reviewed and demonstrates: Sinus with PVCs and 5 beats nonsustained ventricular tachycardia  Laboratory Data:  Chemistry Recent Labs  Lab 10/27/18 1511 10/27/18 1524 10/27/18 1702 10/28/18 0808  NA 139 141  --  134*  K 2.9* 2.9*  --  4.0  CL 99  --   --  97*  CO2 23  --   --  24  GLUCOSE 95  --   --  108*  BUN 47*  --   --  22  CREATININE 20.04* >18.00* 20.18* 11.65*  CALCIUM 5.8*  --   --  7.0*  GFRNONAA 2*  --  2* 4*  GFRAA 2*  --  2* 5*  ANIONGAP 17*  --   --  13    Recent Labs  Lab 10/27/18 1511  PROT 7.3  ALBUMIN 2.6*  AST 54*  ALT 34  ALKPHOS 59  BILITOT 0.8   Hematology Recent Labs  Lab 10/27/18 1511 10/27/18 1524 10/27/18 1702 10/28/18 0808  WBC 5.4  --  4.9 4.8  RBC 2.90*  --  2.69* 2.90*  HGB 7.6* 8.5* 7.2* 7.9*  HCT 26.2* 25.0* 23.9* 26.2*  MCV 90.3  --  88.8 90.3  MCH 26.2  --  26.8 27.2  MCHC 29.0*  --  30.1 30.2  RDW 18.3*  --  18.2* 18.3*  PLT 155  --  149* 160   Cardiac Enzymes Recent Labs  Lab 10/27/18 1511 10/27/18 1702 10/28/18 0808  TROPONINI  0.48* 0.77* 1.13*   BNP Recent Labs  Lab 10/27/18 1511  BNP 3,129.6*    Radiology/Studies:  Dg Chest Port 1 View  Result Date: 10/27/2018 CLINICAL DATA:  Shortness of breath. EXAM: PORTABLE CHEST 1 VIEW COMPARISON:  Radiograph October 03, 2018. FINDINGS: Stable cardiomegaly. Left internal jugular dialysis catheter is unchanged in position. No pneumothorax is noted. Right lung is clear. Stable left basilar opacity is noted concerning for atelectasis and possible small effusion. Bony thorax is unremarkable. IMPRESSION: Stable left basilar opacity as described above. Electronically Signed   By: Marijo Conception, M.D.   On: 10/27/2018 15:26    Assessment and Plan:   1. Elevated troponin-likely demand ischemia in the setting of renal insufficiency.  He was recently changed from hemodialysis to peritoneal dialysis and states he feels worse since that change was made.  He is not having chest pain but complains more of weakness and dyspnea on exertion.  His electrocardiogram shows no acute changes.  I would not pursue further inpatient evaluation at this time.  We can arrange follow-up nuclear study after discharge in approximately  8 weeks.  Note last catheterization as outlined in May 2019.  Medical therapy recommended at that time. 2. Coronary artery disease-continue aspirin, Plavix, statin, isosorbide and beta-blocker. 3. End-stage renal disease-patient feels symptoms are related to recent change from hemodialysis to peritoneal dialysis.  Creatinine 20 at time of admission.  Dialysis per nephrology. 4. Hypertension-blood pressure is controlled.  Continue present medications and follow. 5. Hyperlipidemia-continue statin.  For questions or updates, please contact [place] Please consult [email or web address] for contact info under     Signed, Kirk Ruths, MD  10/28/2018 12:11 PM

## 2018-10-28 NOTE — Progress Notes (Signed)
Inpatient Diabetes Program Recommendations  AACE/ADA: New Consensus Statement on Inpatient Glycemic Control (2015)  Target Ranges:  Prepandial:   less than 140 mg/dL      Peak postprandial:   less than 180 mg/dL (1-2 hours)      Critically ill patients:  140 - 180 mg/dL   Lab Results  Component Value Date   GLUCAP 104 (H) 10/04/2018   HGBA1C 6.9 (H) 08/20/2018    Review of Glycemic Control  Diabetes history: DM 2 Outpatient Diabetes medications: Prandin 0.5 mg BID with meals Current orders for Inpatient glycemic control: Prandin 0.5 mg BID with meals  Inpatient Diabetes Program Recommendations:    Patient with Hx of DM. On DM oral med while here (prandin). Please consider checking CBGs achs and possibly start Novolog 0-9 units tid and d/c Prandin.  Thanks,  Tama Headings RN, MSN, BC-ADM Inpatient Diabetes Coordinator Team Pager [phone] (8a-5p)

## 2018-10-28 NOTE — Progress Notes (Signed)
PROGRESS NOTE    HO PARISI  SEG:[id number] DOB: 1956-01-30 DOA: 10/27/2018 PCP: Nolene Ebbs, MD  Outpatient Specialists:   Brief Narrative:  As per H&P done by Dr. Randa Spike "Patrick [name] is a 63 y.o. male with medical history significant of coronary artery disease status post coronary artery bypass graft, end-stage renal disease disease on peritoneal dialysis, diabetes type 2, diastolic congestive heart failure, elevated troponin, and hypertension who presents to the emergency department complaining of shortness of breath.  [place] 1 month ago patient was admitted to the hospital and found to have abdominal abscesses.  He had extensive work-up and was told he had chronic hematomas.  He was due to follow-up with multiple practitioners but as he has been doing well he did decline a couple of his follow-up appointments.  He has been compliant with his peritoneal dialysis every night.  He had an episode of vomiting yesterday.  This morning he states he felt very short of breath he was weak and tired but has had no chest pain he has had no cough, he denies any fevers or chills.  He has had no travel or sick contacts.  ED Course: Potassium found to be 2.8, calcium found to be 5.6,: An elevated at 0.48 twice his last measurement.  EKG unchanged from prior"  10/28/2018: Patient seen.  Shortness of breath is improving.  No chest pain.  Rising troponin.  Discussed with cardiologist team, cardiology team will see patient in consultation.  Neurology input is highly appreciated.  Serum creatinine is down to 11.65.  Troponin peaked at one-point filter.  Potassium is 4.  Management depend on hospital course.   Assessment & Plan:   Principal Problem:   Acute on chronic diastolic CHF (congestive heart failure) (HCC) Active Problems:   Essential hypertension   Acute gout   Secondary hyperparathyroidism ([place])   End-stage renal failure with renal transplant ([place])   Anemia in chronic kidney disease    Renal transplant rejection   Hypocalcemia   Dyspnea   S/P CABG x 1   Elevated troponin   PAD (peripheral artery disease) (HCC)   Hypokalemia   Acute on chronic diastolic (congestive) heart failure (HCC)  Pulmonary edema/acute on chronic diastolic congestive heart failure:  Possibly from ineffective PD.   Patient was diuresed last night.  Shortness of breath has improved.  No chest pain.  Rising troponin noted.  Cardiology team consulted.  Further management will depend on hospital course.    Rising troponin: On admission, troponin was 0.48, this rose to 0.77 and 1.13 subsequently.   No chest pain.   Further management as per cardiology team.    Hypocalcemia:  This will be repleted with 2 g of calcium gluconate over 2 hours.   Continue to monitor.    Hypokalemia:  Potassium was 2.9 on presentation  This is likely related to peritoneal dialysis.   Potassium has been repleted.   Last renal function reveals potassium of 4.    End-stage renal failure on peritoneal dialysis prior to presentation:  Patient has had significant renal course.   Patient was previously on hemodialysis, and underwent kidney transplantation.   Kidney transplantation fail, patient was back to hemodialysis about 3 to 4 days.   Started on trial of peritoneal dialysis about 2 weeks ago.   Developed significant electrolyte abnormality with shortness of breath and fatigue.   Patient underwent hemodialysis yesterday, and feels a lot better.   Further management will depend on hospital course. Patient  will likely continue hemodialysis.  Essential hypertension:  Continue to optimize.    Secondary hyperparathyroidism:  Will defer to nephrology.    Anemia and chronic kidney disease:  We will defer to nephrology team.   DVT prophylaxis: Subcu heparin Code Status: Full code Family Communication:  Disposition Plan: Home eventually   Consultants:   Nephrology  Cardiology  Procedures:    Hemodialysis last night  Left IJ TDC  Antimicrobials:   None   Subjective: No chest pain Shortness of breath is improving.  Objective: Vitals:   10/28/18 0017 10/28/18 0435 10/28/18 0538 10/28/18 0819  BP: (!) 148/82 135/83  111/76  Pulse: 87 90  89  Resp: 20 18    Temp: 99.4 F (37.4 C) 98.9 F (37.2 C)  99.2 F (37.3 C)  TempSrc: Oral Oral  Oral  SpO2: 100% 100%    Weight:   94.8 kg   Height:        Intake/Output Summary (Last 24 hours) at 10/28/2018 1110 Last data filed at 10/28/2018 0744 Gross per 24 hour  Intake 780 ml  Output 3000 ml  Net -2220 ml   Filed Weights   10/27/18 1945 10/27/18 2352 10/28/18 0538  Weight: 97.8 kg 94.5 kg 94.8 kg    Examination:  General exam: Appears calm and comfortable.  Patient is obese. Respiratory system: Decreased air entry posteriorly.   Cardiovascular system: S1 & S2 heard Gastrointestinal system: Abdomen is obese, soft and nontender.  Organs are difficult to assess.   Central nervous system: Alert and oriented.  Patient moves all limbs. Extremities: No leg edema.  Data Reviewed: I have personally reviewed following labs and imaging studies  CBC: Recent Labs  Lab 10/27/18 1511 10/27/18 1524 10/27/18 1702 10/28/18 0808  WBC 5.4  --  4.9 4.8  NEUTROABS 3.1  --   --   --   HGB 7.6* 8.5* 7.2* 7.9*  HCT 26.2* 25.0* 23.9* 26.2*  MCV 90.3  --  88.8 90.3  PLT 155  --  149* 144   Basic Metabolic Panel: Recent Labs  Lab 10/27/18 1511 10/27/18 1524 10/27/18 1702 10/28/18 0808  NA 139 141  --  134*  K 2.9* 2.9*  --  4.0  CL 99  --   --  97*  CO2 23  --   --  24  GLUCOSE 95  --   --  108*  BUN 47*  --   --  22  CREATININE 20.04* >18.00* 20.18* 11.65*  CALCIUM 5.8*  --   --  7.0*   GFR: Estimated Creatinine Clearance: 6.9 mL/min (A) (by C-G formula based on SCr of 11.65 mg/dL (H)). Liver Function Tests: Recent Labs  Lab 10/27/18 1511  AST 54*  ALT 34  ALKPHOS 59  BILITOT 0.8  PROT 7.3  ALBUMIN 2.6*    No results for input(s): LIPASE, AMYLASE in the last 168 hours. No results for input(s): AMMONIA in the last 168 hours. Coagulation Profile: No results for input(s): INR, PROTIME in the last 168 hours. Cardiac Enzymes: Recent Labs  Lab 10/27/18 1511 10/27/18 1702 10/28/18 0808  TROPONINI 0.48* 0.77* 1.13*   BNP (last 3 results) No results for input(s): PROBNP in the last 8760 hours. HbA1C: No results for input(s): HGBA1C in the last 72 hours. CBG: No results for input(s): GLUCAP in the last 168 hours. Lipid Profile: No results for input(s): CHOL, HDL, LDLCALC, TRIG, CHOLHDL, LDLDIRECT in the last 72 hours. Thyroid Function Tests: No results for input(s): TSH,  T4TOTAL, FREET4, T3FREE, THYROIDAB in the last 72 hours. Anemia Panel: Recent Labs    10/27/18 1950  FERRITIN 1,613*  TIBC 143*  IRON 25*   Urine analysis:    Component Value Date/Time   COLORURINE RED (A) 11/17/2015 0910   APPEARANCEUR TURBID (A) 11/17/2015 0910   LABSPEC 1.035 (H) 11/17/2015 0910   PHURINE 6.0 11/17/2015 0910   GLUCOSEU 100 (A) 11/17/2015 0910   HGBUR LARGE (A) 11/17/2015 0910   BILIRUBINUR LARGE (A) 11/17/2015 0910   KETONESUR 40 (A) 11/17/2015 0910   PROTEINUR >300 (A) 11/17/2015 0910   UROBILINOGEN 0.2 11/24/2013 0242   NITRITE POSITIVE (A) 11/17/2015 0910   LEUKOCYTESUR LARGE (A) 11/17/2015 0910   Sepsis Labs: @LABRCNTIP (procalcitonin:4,lacticidven:4)  ) Recent Results (from the past 240 hour(s))  MRSA PCR Screening     Status: None   Collection Time: 10/28/18 12:10 AM  Result Value Ref Range Status   MRSA by PCR NEGATIVE NEGATIVE Final    Comment:        The GeneXpert MRSA Assay (FDA approved for NASAL specimens only), is one component of a comprehensive MRSA colonization surveillance program. It is not intended to diagnose MRSA infection nor to guide or monitor treatment for MRSA infections. Performed at [facility], [place] [address], [place],  [address]           Radiology Studies: Dg Chest Port 1 View  Result Date: 10/27/2018 CLINICAL DATA:  Shortness of breath. EXAM: PORTABLE CHEST 1 VIEW COMPARISON:  Radiograph October 03, 2018. FINDINGS: Stable cardiomegaly. Left internal jugular dialysis catheter is unchanged in position. No pneumothorax is noted. Right lung is clear. Stable left basilar opacity is noted concerning for atelectasis and possible small effusion. Bony thorax is unremarkable. IMPRESSION: Stable left basilar opacity as described above. Electronically Signed   By: Marijo Conception, M.D.   On: 10/27/2018 15:26        Scheduled Meds: . amLODipine  5 mg Oral Daily  . aspirin EC  81 mg Oral Daily  . bisoprolol  5 mg Oral Daily  . calcium acetate  1,334 mg Oral TID WC  . Chlorhexidine Gluconate Cloth  6 each Topical Q0600  . clopidogrel  75 mg Oral Daily  . [START ON 11/06/2018] colchicine  0.6 mg Oral Q14 Days  . heparin  1,000 Units Intravenous Q T,Th,Sa-HD  . heparin  5,000 Units Subcutaneous Q8H  . isosorbide mononitrate  120 mg Oral Daily  . multivitamin  1 tablet Oral Daily  . repaglinide  0.5 mg Oral BID AC  . rosuvastatin  10 mg Oral q1800  . sodium chloride flush  3 mL Intravenous Q12H   Continuous Infusions:   LOS: 1 day    Time spent: 35 minutes   Dana Allan, MD  [facility] Pager #: [phone] 7PM-7AM contact night coverage as above

## 2018-10-28 NOTE — Progress Notes (Signed)
Dialysis Coordinator completed phone assessment with patient who was very pleasant. He requests a first shift appointment at [facility]. DC submitted referral, with this request, and will follow until OP HD seat is confirmed. Patient was understanding that the clinic may not be able to accommodate his request at this time, but DC will let his preference be known.  Alphonzo Cruise Dialysis Coordinator [phone]

## 2018-10-28 NOTE — Progress Notes (Signed)
Pt refused lab draw, stated that he's tired being stuck, explained the importance of getting his troponin but pt still refused. Tylene Fantasia NP made aware.

## 2018-10-28 NOTE — Progress Notes (Signed)
[facility] Progress Note  Subjective: feeling much better after HD last night, nausea and fatigue > 50% better. Pt wants to go back to HD.  Has had TDC in [facility] since last May.  Has had mult failed UE accesses. No SOB or edema today. -3L last night on HD.   Vitals:   10/28/18 0435 10/28/18 0538 10/28/18 0819 10/28/18 1133  BP: 135/83  111/76 115/75  Pulse: 90  89 78  Resp: 18   16  Temp: 98.9 F (37.2 C)  99.2 F (37.3 C) 99.7 F (37.6 C)  TempSrc: Oral  Oral Oral  SpO2: 100%   100%  Weight:  94.8 kg    Height:        Inpatient medications: . amLODipine  5 mg Oral Daily  . aspirin EC  81 mg Oral Daily  . bisoprolol  5 mg Oral Daily  . calcium acetate  1,334 mg Oral TID WC  . Chlorhexidine Gluconate Cloth  6 each Topical Q0600  . clopidogrel  75 mg Oral Daily  . [START ON 11/06/2018] colchicine  0.6 mg Oral Q14 Days  . heparin  1,000 Units Intravenous Q T,Th,Sa-HD  . heparin  5,000 Units Subcutaneous Q8H  . isosorbide mononitrate  120 mg Oral Daily  . multivitamin  1 tablet Oral Daily  . repaglinide  0.5 mg Oral BID AC  . rosuvastatin  10 mg Oral q1800  . sodium chloride flush  3 mL Intravenous Q12H    acetaminophen, albuterol, docusate sodium, gentamicin cream, ondansetron **OR** ondansetron (ZOFRAN) IV, polyethylene glycol, zolpidem  Iron/TIBC/Ferritin/ %Sat    Component Value Date/Time   IRON 25 (L) 10/27/2018 1950   TIBC 143 (L) 10/27/2018 1950   FERRITIN 1,613 (H) 10/27/2018 1950   IRONPCTSAT 18 10/27/2018 1950    Exam: General: WDWN NAD slightly anxious  Head: NCAT sclera not icteric MMM Neck: Supple.  Lungs: crackles at bases no wheezes. Breathing is unlabored. Heart: RRR with S1 S2.  Abdomen: obese soft NT + BS PD cath exit clean Lower extremities:without edema or ischemic changes, no open wounds  Neuro: A & O  X 3. Moves all extremities spontaneously. Psych:  Responds to questions appropriately with  Usual affect. Dialysis Access: left  IJ TDC no drainage on dressing - suture intact  PD Cath  Dialysis Orders: PD  98 6 exchanges 2.5 fill dwel 1.5 hr dry day -  no VDRA last Mircera 50 3/3 13% sat ferritin 1546   Assessment/Plan: 1. Fatigue/ nausea/ vomiting - uremic symptoms due to underdialysis on PD. pt is compliant w/ PD and feels much better today after 1 HD session last night.  Pt wants to return to HD. He has [place] in since last May 2019, has had numerous failed UE accesses, no hx thigh access. Will start CLIP to OP HD and consult vasc surgery for perm access. 2. ESRD -  Recent transition to CCPD, last HD early May. Now uremic on PD, see above about going back to HD.  Plan HD again today, then Sat. CLIP to OP HD.  3. HTN/ vol - vol up a bit, 3L off yest and Bp's down some. Cont to lower gradually.  No CHF on exam.  4. Anemia ckd  - acute drop - repeat Fe stores, gave 100ug darbe yest 4/1. Likely needs more Fe. Would check FOBT 5. Metabolic bone disease -  Acute hypocalcemia, hx of remote parathyroidectomy so prone to low Ca levels. Gave IV Ca yesterday  and Ca is better today.  6. Nutrition - liberalize to heart healthy carb mod to ^ K intake 7. CAD hx MI/CABG - trop ^ f/u per primary 8. DM - per primary  [facility] 10/28/2018, 12:47 PM  Recent Labs  Lab 10/27/18 1511 10/27/18 1524 10/27/18 1702 10/28/18 0808  NA 139 141  --  134*  K 2.9* 2.9*  --  4.0  CL 99  --   --  97*  CO2 23  --   --  24  GLUCOSE 95  --   --  108*  BUN 47*  --   --  22  CREATININE 20.04* >18.00* 20.18* 11.65*  CALCIUM 5.8*  --   --  7.0*  ALBUMIN 2.6*  --   --   --    Recent Labs  Lab 10/27/18 1511  AST 54*  ALT 34  ALKPHOS 59  BILITOT 0.8  PROT 7.3   Recent Labs  Lab 10/27/18 1511  10/27/18 1702 10/28/18 0808  WBC 5.4  --  4.9 4.8  NEUTROABS 3.1  --   --   --   HGB 7.6*   < > 7.2* 7.9*  HCT 26.2*   < > 23.9* 26.2*  MCV 90.3  --  88.8 90.3  PLT 155  --  149* 160   < > = values in this interval not  displayed.

## 2018-10-28 NOTE — TOC Initial Note (Signed)
Transition of Care [facility]) - Initial/Assessment Note    Patient Details  Name: Patrick [name] MRN: [id number] Date of Birth: 01-11-1956  Transition of Care [facility]) CM/SW Contact:    Sherrilyn Rist Phone Number: [phone] 10/28/2018, 10:54 AM  Clinical Narrative:                 Patient lives at home with spouse; continues to drive, active; PCP: Nolene Ebbs, MD; has private insurance with Medicare; pharmacy of choice is [facility]; patient reports no problem getting medication; has scales at home and knows to weigh himself daily; he states that he continues to use salt in his diet and he stated that no one has ever told him not to. CM will continue to follow for progression of care.  Expected Discharge Plan: Home/Self Care Barriers to Discharge: No Barriers Identified   Patient Goals and CMS Choice Patient states their goals for this hospitalization and ongoing recovery are:: to stay at home CMS Medicare.gov Compare Post Acute Care list provided to:: Patient Choice offered to / list presented to : NA  Expected Discharge Plan and Services Expected Discharge Plan: Home/Self Care In-house Referral: NA Discharge Planning Services: CM Consult   Living arrangements for the past 2 months: Single Family Home                 DME Arranged: N/A DME Agency: NA HH Arranged: NA HH Agency: NA  Prior Living Arrangements/Services Living arrangements for the past 2 months: Single Family Home Lives with:: Spouse Patient language and need for interpreter reviewed:: No Do you feel safe going back to the place where you live?: Yes      Need for Family Participation in Patient Care: No (Comment) Care giver support system in place?: Yes (comment)   Criminal Activity/Legal Involvement Pertinent to Current Situation/Hospitalization: No - Comment as needed  Activities of Daily Living Home Assistive Devices/Equipment: None ADL Screening (condition at time of admission) Patient's  cognitive ability adequate to safely complete daily activities?: Yes Is the patient deaf or have difficulty hearing?: No Does the patient have difficulty seeing, even when wearing glasses/contacts?: No Does the patient have difficulty concentrating, remembering, or making decisions?: No Patient able to express need for assistance with ADLs?: Yes Does the patient have difficulty dressing or bathing?: No Independently performs ADLs?: Yes (appropriate for developmental age) Does the patient have difficulty walking or climbing stairs?: Yes Weakness of Legs: None Weakness of Arms/Hands: None  Permission Sought/Granted Permission sought to share information with : Case Manager Permission granted to share information with : Yes, Verbal Permission Granted  Share Information with NAME: Adela Lank - spouse           Emotional Assessment Appearance:: Developmentally appropriate Attitude/Demeanor/Rapport: Engaged, Gracious Affect (typically observed): Accepting Orientation: : Oriented to Self, Oriented to  Time, Oriented to Place, Oriented to Situation Alcohol / Substance Use: Not Applicable Psych Involvement: No (comment)  Admission diagnosis:  Hypocalcemia [E83.51] Hypokalemia [E87.6] Uremia [N19] Elevated troponin [R79.89] Patient Active Problem List   Diagnosis Date Noted  . Acute on chronic diastolic (congestive) heart failure ([place]) 10/27/2018  . SOB (shortness of breath) 10/02/2018  . Peritoneal abscess ([place]) 10/01/2018  . Nausea and vomiting 08/20/2018  . Chronic anemia 05/12/2018  . Hypokalemia 05/12/2018  . Acute combined systolic and diastolic congestive heart failure ([place]) 05/12/2018  . Hyperlipidemia 05/12/2018  . Physical deconditioning 05/12/2018  . Multifocal pneumonia 05/11/2018  . Allergy to IVP dye, subsequent encounter   . CAD (  coronary artery disease) 11/27/2017  . Atherosclerosis of native arteries of extremity with intermittent claudication ([place]) 04/03/2017  . PAD  (peripheral artery disease) ([place]) 02/02/2017  . Typical atrial flutter ([place]) 09/24/2016  . PAF (paroxysmal atrial fibrillation) ([place]) 05/25/2016  . ESRD (end stage renal disease) ([place]) 05/11/2016  . Anemia of chronic disease   . End-stage renal disease on hemodialysis ([place])   . Renovascular hypertension   . History of MI (myocardial infarction)   . Acute respiratory failure with hypoxia ([place])   . Controlled diabetes mellitus type 2 with complications ([place])   . Pleural effusion   . Symptomatic anemia 01/22/2016  . Acute respiratory failure ([place]) 01/22/2016  . Acute on chronic diastolic CHF (congestive heart failure) ([place]) 01/22/2016  . Elevated troponin 01/22/2016  . Febrile illness 01/22/2016  . Chest pain 11/17/2015  . Normocytic anemia 11/17/2015  . Gynecomastia 11/17/2015  . Renal stone 11/17/2015  . Cough 11/17/2015  . Dyspnea 10/30/2015  . CAP (community acquired pneumonia)   . HCAP (healthcare-associated pneumonia) 10/11/2015  . Acute respiratory disease 10/11/2015  . Protein-calorie malnutrition, severe ([place]) 09/16/2014  . 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 Metabolic acidosis, NAG, failure of bicarbonate regeneration 09/14/2014  . Anemia in chronic kidney disease 11/24/2013  . 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 Hypocalcemia 11/24/2013  . Low bicarbonate 11/24/2013  . Essential hypertension 03/19/2013  . Acute gout 03/19/2013  . Impotence of organic origin 03/19/2013  . Secondary hyperparathyroidism ([place]) 03/19/2013  . End-stage renal failure with renal transplant ([place]) 03/19/2013  . 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

## 2018-10-28 NOTE — Progress Notes (Signed)
Pt back from HD. Cardiac monitor check, CCMD made aware.

## 2018-10-28 NOTE — Consult Note (Addendum)
[name] NOTE   MRN : [id number]  Reason for Consult: Needs permanent HD access Referring Physician: Dr. 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  Most recently he was on PD at home and had recent admission for seroma vs absccess 3/6-3/10  IR drained fluid.Culture was negative - he was discharged on Augmentin.    He has had multiple UE access prior procedures:working TDC exchange by Dr. Donnetta Hutching 01/11/2018.    1.  Failed TE LUA AVG (10/08/17), LIJV TDC placement 2.  TE LUA AVG (09/14/17) 3.  PTA+S L CIA and EIA (04/13/17) 4.  LUA AVG (07/07/16) 5.  RUA AVG (09/18/14)   He has now decided he wants to return to HD and we have been consulted for HD access.    Past medical history includes: diabetes mellitus, coronary artery disease status post coronary artery bypass and graft, end-stage renal disease on peritoneal dialysis, hypertension, peripheral vascular disease, atrial flutter status post ablation.         Current Facility-Administered Medications  Medication Dose Route Frequency Provider Last Rate Last Dose  . acetaminophen (TYLENOL) tablet 1,000 mg  1,000 mg Oral Q6H PRN Lady Deutscher, MD      . albuterol (PROVENTIL HFA;VENTOLIN HFA) 108 (90 Base) MCG/ACT inhaler 2 puff  2 puff Inhalation Q6H PRN Lady Deutscher, MD      . amLODipine (NORVASC) tablet 5 mg  5 mg Oral Daily Lady Deutscher, MD   5 mg at 10/28/18 0816  . aspirin EC tablet 81 mg  81 mg Oral Daily Lady Deutscher, MD   81 mg at 10/28/18 0817  . bisoprolol (ZEBETA) tablet 5 mg  5 mg Oral Daily Lady Deutscher, MD   5 mg at 10/28/18 0817  . calcium acetate (PHOSLO) capsule 1,334 mg  1,334 mg Oral TID WC Lady Deutscher, MD   1,334 mg at 10/28/18 1201  . Chlorhexidine Gluconate Cloth 2 % PADS 6 each  6 each Topical Q0600 Alric Seton, PA-C   6 each at 10/28/18 [phone]  . [START ON  10/29/2018] Chlorhexidine Gluconate Cloth 2 % PADS 6 each  6 each Topical Q0600 Roney Jaffe, MD      . clopidogrel (PLAVIX) tablet 75 mg  75 mg Oral Daily Lady Deutscher, MD   75 mg at 10/28/18 0816  . [START ON 11/06/2018] colchicine tablet 0.6 mg  0.6 mg Oral Q14 Days Lady Deutscher, MD      . docusate sodium (COLACE) capsule 100 mg  100 mg Oral Daily PRN Lady Deutscher, MD      . gentamicin cream (GARAMYCIN) 0.1 % 1 application  1 application Topical Daily PRN Lady Deutscher, MD      . heparin injection 1,000 Units  1,000 Units Intravenous Q T,Th,Sa-HD Patrick Shiley, MD   4,200 Units at 10/27/18 2233  . heparin injection 5,000 Units  5,000 Units Subcutaneous Q8H Lady Deutscher, MD   5,000 Units at 10/28/18 [phone]  . isosorbide mononitrate (IMDUR) 24 hr tablet 120 mg  120 mg Oral Daily Lady Deutscher, MD   120 mg at 10/28/18 1497  . multivitamin (RENA-VIT) tablet 1 tablet  1 tablet Oral Daily Lady Deutscher, MD   1 tablet at 10/28/18 0817  . ondansetron (ZOFRAN) tablet 4 mg  4 mg Oral Q6H PRN Lady Deutscher, MD  Or  . ondansetron (ZOFRAN) injection 4 mg  4 mg Intravenous Q6H PRN Lady Deutscher, MD      . polyethylene glycol (MIRALAX / GLYCOLAX) packet 17 g  17 g Oral Daily PRN Lady Deutscher, MD      . repaglinide (PRANDIN) tablet 0.5 mg  0.5 mg Oral BID AC Lady Deutscher, MD   0.5 mg at 10/28/18 0954  . rosuvastatin (CRESTOR) tablet 10 mg  10 mg Oral q1800 Lady Deutscher, MD      . sodium chloride flush (NS) 0.9 % injection 3 mL  3 mL Intravenous Q12H Lady Deutscher, MD   3 mL at 10/28/18 0820  . zolpidem (AMBIEN) tablet 5 mg  5 mg Oral QHS PRN,MR X 1 Lady Deutscher, MD        Pt meds include: Statin :Yes Betablocker: Yes ASA: Yes Other anticoagulants/antiplatelets: None  Past Medical History:  Diagnosis Date  . Arthritis   . Atrial flutter ([place])    Ablation 2018  . Coronary artery disease    Anomalous left main off RCA status  post CABG 1997, cardiac catheterization May 2019 demonstrated atretic LIMA to LAD and occluded LAD with left to left and left-to-right collaterals  . Erectile dysfunction   . ESRD (end stage renal disease) on dialysis ([place])    Pt on HD 2003 >> transplanted in 2009, back on HD in 2016. [name].   . Essential hypertension   . Gout   . Hernia of abdominal wall   . History of blood transfusion   . History of cardiomyopathy   . History of kidney stones   . History of pneumonia   . Migraine   . Myocardial infarction ([place])    1996  . Secondary hyperparathyroidism ([place])   . Type 2 diabetes mellitus ([place])   . Wears glasses     Past Surgical History:  Procedure Laterality Date  . A-FLUTTER ABLATION N/A 09/24/2016   Procedure: A-Flutter Ablation;  Surgeon: Will Meredith Leeds, MD;  Location: [place] [facility];  Service: Cardiovascular;  Laterality: N/A;  . ABDOMINAL AORTOGRAM W/LOWER EXTREMITY N/A 04/13/2017   Procedure: ABDOMINAL AORTOGRAM W/LOWER EXTREMITY;  Surgeon: Conrad [place], MD;  Location: [place] [facility];  Service: Cardiovascular;  Laterality: N/A;  Bilater lower extermity  . APPENDECTOMY    . AV FISTULA PLACEMENT Right 09/18/2014   Procedure: INSERTION OF ARTERIOVENOUS (AV) GORE-TEX GRAFT ARM USING 4-7MM  X 45CM STRETCH GORE-TEX VASCULAR GRAFT;  Surgeon: Rosetta Posner, MD;  Location: [place];  Service: Vascular;  Laterality: Right;  . AV FISTULA PLACEMENT Left 07/07/2016   Procedure: INSERTION OF LEFT BRACHIAL TO AXILLARY ARTERIOVENOUS (AV) GORE-TEX ARM GRAFT;  Surgeon: Conrad [place], MD;  Location: [place];  Service: Vascular;  Laterality: Left;  . CARDIAC CATHETERIZATION  ~ 2016  . COLONOSCOPY    . 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 EXCHANGE OF A DIALYSIS CATHETER Left 01/11/2018   Procedure: EXCHANGE OF TUNNELED DIALYSIS CATHETER;  Surgeon: Rosetta Posner, MD;  Location: [place];  Service: Vascular;   Laterality: Left;  . HERNIA REPAIR  2017   with nephrectomy  . INSERTION OF DIALYSIS CATHETER N/A 10/08/2017   Procedure: INSERTION OF TUNNELED DIALYSIS CATHETER;  Surgeon: Conrad Connell, MD;  Location: [place];  Service: Vascular;  Laterality: N/A;  . IR FLUORO GUIDE CV LINE LEFT  03/12/2018  .  IR GENERIC HISTORICAL  05/11/2016   IR FLUORO GUIDE CV LINE LEFT 05/11/2016 Corrie Mckusick, DO [facility]  . IR GENERIC HISTORICAL  05/11/2016   IR US GUIDE VASC ACCESS LEFT 05/11/2016 Corrie Mckusick, DO [facility]  . IR GENERIC HISTORICAL  05/11/2016   IR US GUIDE VASC ACCESS RIGHT 05/11/2016 Corrie Mckusick, DO [facility]  . IR GENERIC HISTORICAL  05/11/2016   IR RADIOLOGY PERIPHERAL GUIDED IV START 05/11/2016 Corrie Mckusick, DO [facility]  . KIDNEY TRANSPLANT  2009  . LEFT HEART CATH AND CORS/GRAFTS ANGIOGRAPHY N/A 12/04/2017   Procedure: LEFT HEART CATH AND CORS/GRAFTS ANGIOGRAPHY;  Surgeon: Belva Crome, MD;  Location: [place] [facility];  Service: Cardiovascular;  Laterality: N/A;  . NEPHRECTOMY  2017   transplant rejected   . PERIPHERAL VASCULAR CATHETERIZATION N/A 06/04/2016   Procedure: Upper Extremity Venography;  Surgeon: Waynetta Sandy, MD;  Location: [place] [facility];  Service: Cardiovascular;  Laterality: N/A;  . PERIPHERAL VASCULAR INTERVENTION  04/13/2017   Procedure: PERIPHERAL VASCULAR INTERVENTION;  Surgeon: Conrad [place], MD;  Location: [place] [facility];  Service: Cardiovascular;;  Lt. Common/Exernal  Iliac  . THROMBECTOMY AND REVISION OF ARTERIOVENTOUS (AV) GORETEX  GRAFT Left 10/08/2017   Procedure: THROMBECTOMY of ARTERIOVENTOUS (AV) GORETEX  GRAFT LEFT UPPER ARM;  Surgeon: Conrad [place], MD;  Location: [place];  Service: Vascular;  Laterality: Left;  . THROMBECTOMY W/ EMBOLECTOMY Left 09/14/2017   Procedure: THROMBECTOMY ARTERIOVENOUS GORE-TEX GRAFT LEFT UPPER ARM;  Surgeon: Rosetta Posner, MD;  Location: [place];  Service: Vascular;  Laterality: Left;  . UPPER  EXTREMITY VENOGRAPHY N/A 11/16/2017   Procedure: UPPER EXTREMITY VENOGRAPHY - Right Arm;  Surgeon: Conrad [place], MD;  Location: [place] [facility];  Service: Cardiovascular;  Laterality: N/A;    Social History Social History   Tobacco Use  . Smoking status: Former Smoker    Types: Cigarettes    Last attempt to quit: 07/04/2014    Years since quitting: 4.3  . Smokeless tobacco: Never Used  . Tobacco comment: "smoked ~ 1 pack/month when I did smoke; never a steady smoker"  Substance Use Topics  . Alcohol use: Yes    Alcohol/week: 0.0 standard drinks    Comment: rare  "2 drinks, 1-2 times/year"  . Drug use: No    Family History Family History  Problem Relation Age of Onset  . Hyperlipidemia Mother   . Hypertension Mother   . HIV Sister     Allergies  Allergen Reactions  . Iodinated Diagnostic Agents Swelling, Rash and Other (See Comments)    Other Reaction: burning to mouth, swelling of lips  . Lipitor [Atorvastatin] Other (See Comments)    Leg pain  . Metoprolol Other (See Comments)    Headaches   . Baclofen Other (See Comments)    Possibly stroke like symptoms     REVIEW OF SYSTEMS  General: [ ]  Weight loss, [ ]  Fever, [ ]  chills Neurologic: [ ]  Dizziness, [ ]  Blackouts, [ ]  Seizure [ ]  Stroke, [ ]  "Mini stroke", [ ]  Slurred speech, [ ]  Temporary blindness; [ ]  weakness in arms or legs, [ ]  Hoarseness [ ]  Dysphagia Cardiac: [ ]  Chest pain/pressure, [ ]  Shortness of breath at rest [ ]  Shortness of breath with exertion, [ ]  Atrial fibrillation or irregular heartbeat  Vascular: [ ]  Pain in legs with walking, [ ]  Pain in legs at rest, [ ]  Pain in legs at night,  [ ]   Non-healing ulcer, [ ]  Blood clot in vein/DVT,   Pulmonary: [ ]  Home oxygen, [ ]  Productive cough, [ ]  Coughing up blood, [ ]  Asthma,  [ ]  Wheezing [ ]  COPD Musculoskeletal:  [ ]  Arthritis, [ ]  Low back pain, [ ]  Joint pain Hematologic: [ ]  Easy Bruising, [ ]  Anemia; [ ]  Hepatitis Gastrointestinal: [ ]   Blood in stool, [ ]  Gastroesophageal Reflux/heartburn, Urinary: [ ]  chronic Kidney disease, [ ]  on HD - [ ]  MWF or [ ]  TTHS, [ ]  Burning with urination, [ ]  Difficulty urinating Skin: [ ]  Rashes, [ ]  Wounds Psychological: [ ]  Anxiety, [ ]  Depression  Physical Examination Vitals:   10/28/18 0435 10/28/18 0538 10/28/18 0819 10/28/18 1133  BP: 135/83  111/76 115/75  Pulse: 90  89 78  Resp: 18   16  Temp: 98.9 F (37.2 C)  99.2 F (37.3 C) 99.7 F (37.6 C)  TempSrc: Oral  Oral Oral  SpO2: 100%   100%  Weight:  94.8 kg    Height:       Body mass index is 33.72 kg/m.  General:  WDWN in NAD HENT: WNL, normocephalic Eyes: Pupils equal Pulmonary: normal non-labored breathing , without Rales, rhonchi,  wheezing Cardiac: RRR, without  Murmurs, rubs or gallops; No carotid bruits Abdomen: soft, NT, no masses, PD cath in place Skin: no rashes, ulcers noted;  no Gangrene , no cellulitis; no open wounds;   Vascular Exam/Pulses:B UE warm with intact motor radial pulses not palpable, femoral and DP/PT pulses palpable B.   Musculoskeletal: no muscle wasting or atrophy Neurologic: A&O X 3; Appropriate Affect ;  SENSATION: normal; MOTOR FUNCTION: 5/5 Symmetric Speech is fluent/normal   Significant Diagnostic Studies: CBC Lab Results  Component Value Date   WBC 4.8 10/28/2018   HGB 7.9 (L) 10/28/2018   HCT 26.2 (L) 10/28/2018   MCV 90.3 10/28/2018   PLT 160 10/28/2018    BMET    Component Value Date/Time   NA 134 (L) 10/28/2018 0808   NA 139 11/30/2017 1008   K 4.0 10/28/2018 0808   CL 97 (L) 10/28/2018 0808   CO2 24 10/28/2018 0808   GLUCOSE 108 (H) 10/28/2018 0808   BUN 22 10/28/2018 0808   BUN 48 (H) 11/30/2017 1008   CREATININE 11.65 (H) 10/28/2018 0808   CALCIUM 7.0 (L) 10/28/2018 0808   GFRNONAA 4 (L) 10/28/2018 0808   GFRAA 5 (L) 10/28/2018 0808   Estimated Creatinine Clearance: 6.9 mL/min (A) (by C-G formula based on SCr of 11.65 mg/dL (H)).  COAG Lab Results   Component Value Date   INR 1.06 08/19/2018   INR 1.89 09/24/2016   INR 1.30 07/07/2016       ASSESSMENT/PLAN:  ESRD currently on HD via left [facility]  The patient wishes to explore possible left UE graft placement before he wants to have a thigh graft placed if possible since he has a long standing working left TDC.  His most recent left UE procedure was failed thrombectomy with previous stents. I explained even if we are able to place a left UE graft he will need a thigh TDC until the graft is ready for use.  If the left UE is not useful then we will plan a thigh loop graft.    Dr. Trula Slade  Will review previous studies and make recommendations for future access.    After discussion with Dr. Trula Slade we will proceed first with bilateral UE venograms and possible bilateral UE arteriogram  to best determine the next placement for access 10/29/2018.  NPO past MN.     Patrick [name] 10/28/2018 1:35 PM  I agree with the above.  I have seen and evaluated the patient and agree with the PAs above note.  Briefly this is a 63 yo male with end-stage renal disease.  He has had multiple bilateral upper extremity grafts.  He would prefer not to have a graft in his leg.  From an access management perspective, I think he needs to undergo bilateral upper extremity venograms to evaluate his options.  He has a palpable left radial pulse and a nonpalpable right radial pulse.  If his best option is a right upper extremity graft, he would probably benefit from right upper extremity angiography prior to placing a right-sided graft.  Definitive access plans will be made based on the results of the studies.  He will be n.p.o. after midnight for bilateral upper extremity venograms and possible upper extremity angiography tomorrow.  Annamarie Major

## 2018-10-28 NOTE — Progress Notes (Signed)
Gave patient PM medication prior to going down to dialysis -  Held PM  phoslo since patient took his lunch time dose with dinner.   Patient also took tylenol at this time.

## 2018-10-29 ENCOUNTER — Inpatient Hospital Stay (HOSPITAL_COMMUNITY)
Admission: EM | Disposition: A | Discharge status: Home / Self Care | Payer: Self-pay | Source: Home / Self Care | Attending: Internal Medicine

## 2018-10-29 ENCOUNTER — Telehealth: Payer: Self-pay | Admitting: Physician Assistant

## 2018-10-29 DIAGNOSIS — N186 End stage renal disease: Secondary | ICD-10-CM | POA: Diagnosis not present

## 2018-10-29 DIAGNOSIS — N2581 Secondary hyperparathyroidism of renal origin: Secondary | ICD-10-CM | POA: Diagnosis not present

## 2018-10-29 DIAGNOSIS — J14 Pneumonia due to Hemophilus influenzae: Secondary | ICD-10-CM | POA: Diagnosis not present

## 2018-10-29 HISTORY — PX: UPPER EXTREMITY VENOGRAPHY: CATH118272

## 2018-10-29 HISTORY — PX: UPPER EXTREMITY ANGIOGRAPHY: CATH118270

## 2018-10-29 LAB — CBC
HCT: 26.4 % — ABNORMAL LOW (ref 39.0–52.0)
Hemoglobin: 8 g/dL — ABNORMAL LOW (ref 13.0–17.0)
MCH: 27.6 pg (ref 26.0–34.0)
MCHC: 30.3 g/dL (ref 30.0–36.0)
MCV: 91 fL (ref 80.0–100.0)
Platelets: 169 10*3/uL (ref 150–400)
RBC: 2.9 MIL/uL — ABNORMAL LOW (ref 4.22–5.81)
RDW: 17.8 % — ABNORMAL HIGH (ref 11.5–15.5)
WBC: 4.7 10*3/uL (ref 4.0–10.5)
nRBC: 0 % (ref 0.0–0.2)

## 2018-10-29 LAB — BASIC METABOLIC PANEL
Anion gap: 13 (ref 5–15)
BUN: 22 mg/dL (ref 8–23)
CO2: 24 mmol/L (ref 22–32)
Calcium: 7 mg/dL — ABNORMAL LOW (ref 8.9–10.3)
Chloride: 97 mmol/L — ABNORMAL LOW (ref 98–111)
Creatinine, Ser: 11.65 mg/dL — ABNORMAL HIGH (ref 0.61–1.24)
GFR calc Af Amer: 5 mL/min — ABNORMAL LOW (ref >60)
GFR calc non Af Amer: 4 mL/min — ABNORMAL LOW (ref >60)
Glucose, Bld: 108 mg/dL — ABNORMAL HIGH (ref 70–99)
Potassium: 4 mmol/L (ref 3.5–5.1)
Sodium: 134 mmol/L — ABNORMAL LOW (ref 135–145)

## 2018-10-29 LAB — PROTIME-INR
INR: 1.2 (ref 0.8–1.2)
Prothrombin Time: 14.6 seconds (ref 11.4–15.2)

## 2018-10-29 LAB — CALCIUM, IONIZED: Calcium, Ionized, Serum: 3.7 mg/dL — ABNORMAL LOW (ref 4.5–5.6)

## 2018-10-29 SURGERY — UPPER EXTREMITY VENOGRAPHY
Anesthesia: LOCAL | Laterality: Bilateral

## 2018-10-29 MED ORDER — HEPARIN (PORCINE) IN NACL 1000-0.9 UT/500ML-% IV SOLN
INTRAVENOUS | Status: AC
  Filled 2018-10-29: qty 1000

## 2018-10-29 MED ORDER — HEPARIN SODIUM (PORCINE) 1000 UNIT/ML DIALYSIS: 3000.0000 [IU] | Freq: Once | INTRAMUSCULAR | Status: DC

## 2018-10-29 MED ORDER — DIPHENHYDRAMINE HCL 50 MG/ML IJ SOLN
25.0000 mg | Freq: Once | INTRAMUSCULAR | Status: AC
  Administered 2018-10-29: 25 mg via INTRAVENOUS
  Filled 2018-10-29: qty 1

## 2018-10-29 MED ORDER — HEPARIN SODIUM (PORCINE) 1000 UNIT/ML DIALYSIS: 2000.0000 [IU] | INTRAMUSCULAR | Status: DC | PRN

## 2018-10-29 MED ORDER — IODIXANOL 320 MG/ML IV SOLN
INTRAVENOUS | Status: DC | PRN
  Administered 2018-10-29: 70 mL via INTRAVENOUS

## 2018-10-29 MED ORDER — LIDOCAINE HCL (PF) 1 % IJ SOLN
INTRAMUSCULAR | Status: AC
  Filled 2018-10-29: qty 30

## 2018-10-29 MED ORDER — HEPARIN (PORCINE) IN NACL 1000-0.9 UT/500ML-% IV SOLN
INTRAVENOUS | Status: DC | PRN
  Administered 2018-10-29: 500 mL

## 2018-10-29 MED ORDER — HEPARIN SODIUM (PORCINE) 1000 UNIT/ML IJ SOLN
INTRAMUSCULAR | Status: AC
  Filled 2018-10-29: qty 4

## 2018-10-29 MED ORDER — CHLORHEXIDINE GLUCONATE CLOTH 2 % EX PADS: 6.0000 | MEDICATED_PAD | Freq: Every day | CUTANEOUS | Status: DC

## 2018-10-29 MED ORDER — METHYLPREDNISOLONE SODIUM SUCC 125 MG IJ SOLR
125.0000 mg | Freq: Once | INTRAMUSCULAR | Status: AC
  Administered 2018-10-29: 125 mg via INTRAVENOUS
  Filled 2018-10-29: qty 2

## 2018-10-29 SURGICAL SUPPLY — 9 items
KIT MICROPUNCTURE NIT STIFF (SHEATH) ×2 IMPLANT
KIT PV (KITS) ×2 IMPLANT
SHEATH PINNACLE 5F 10CM (SHEATH) ×2 IMPLANT
SHEATH PROBE COVER 6X72 (BAG) ×2 IMPLANT
STOPCOCK MORSE 400PSI 3WAY (MISCELLANEOUS) ×4 IMPLANT
SYR MEDRAD MARK V 150ML (SYRINGE) ×2 IMPLANT
TRANSDUCER W/STOPCOCK (MISCELLANEOUS) ×2 IMPLANT
TRAY PV CATH (CUSTOM PROCEDURE TRAY) ×2 IMPLANT
TUBING CIL FLEX 10 FLL-RA (TUBING) ×2 IMPLANT

## 2018-10-29 NOTE — Op Note (Signed)
Date: October 29, 2018  Preoperative diagnosis: Failed bilateral upper extremity AV grafts with need for new access placement  Postoperative diagnosis: Same  Procedure: 1.  Bilateral upper extremity venogram  Surgeon: Dr. Marty Heck, MD  Indications: Patient is a 63 year old male with long history of end-stage renal disease that has undergone failed bilateral upper extremity AV grafts and vascular surgery was consulted for femoral access placement.  He was seen by Dr. Trula Slade yesterday and recommended bilateral upper extremity venogram since the patient did not want a thigh graft.  He presents today after risks and benefits were discussed.  Findings: 1.  Right upper extremity venogram revealed no usable vein in the upper arm for access placement with multiple collaterals and centrally the subclavian vein drained into the internal jugular vein with an occluded right innominate including an occluded right innominate stent and a large collateral in the chest wall.  I do not feel the right upper arm is usable for any future access. 2.  Left upper extremity venogram revealed at least one brachial vein that is patent in the upper arm with filling of the axillary and subclavian vein centrally.  He does have a tunneled IJ catheter on the left that made evaluation of the innominate difficult (at least partial filling around the catheter in the innominate).  A stent in the left upper extremity appears occluded that is likely placed in his old thrombosed upper arm AV graft.  Complications: None  Contrast: 70 mL  Details: The patient was taken to [facility] lab 8 after informed consent was obtained.  He was placed on the procedure table in the supine position.  He had a 22-gauge IV in his left hand.  A 22-gauge IV was subsequently placed in the right upper extremity near the [facility] fossa.  Initially started with the right upper extremity and using hand injections through his 22-gauge IV at the antecubitum a  central venogram as well as right upper extremity venogram was obtained with pertinent findings noted above.  We then directed our attention to the left upper extremity and injected his 22-gauge IV in the hand initially with a central venogram and then left upper extremity venogram.  Pertinent findings on the left are noted above.  Patient did not receive any medications except Benadryl and Solumedrol for contrast prophylaxis and tolerated the procedure without any complications.  He was taken back to his room in stable condition.  Condition: Stable  Marty Heck, MD [facility] Office: [phone] Pager: [place]

## 2018-10-29 NOTE — Progress Notes (Signed)
Patient has OP HD seat confirmed at [facility] MWF schedule, 10:50am (the earliest appointment the clinic has at this time), with a 10:15am start on his first appointment. The clinic is prepared for patient to come to his first appointment on Monday, November 01, 2018, if discharge is still anticipated for over the weekend. [facility] will send schedule letter to the hospital to be given to patient. Dialysis Coordinator contacted patient to inform him of schedule.   [facility] [place], [place]

## 2018-10-29 NOTE — Progress Notes (Signed)
[facility] Progress Note  Subjective: pt seen in room, had bilat UE venogram, no veins usable on R but there was a vein on the LUE that may be usable (TDC in the innom draining vein though).  Pt CLIP'd to [place] on MWF, can start on Monday.   Vitals:   10/29/18 0440 10/29/18 0506 10/29/18 0916 10/29/18 1033  BP:   117/74 108/70  Pulse:   78 72  Resp: 18   20  Temp:   (!) 97.5 F (36.4 C) (!) 97 F (36.1 C)  TempSrc:   Oral Oral  SpO2:   100% 100%  Weight:  93 kg    Height:        Inpatient medications: . amLODipine  5 mg Oral Daily  . aspirin EC  81 mg Oral Daily  . bisoprolol  5 mg Oral Daily  . calcium acetate  1,334 mg Oral TID WC  . Chlorhexidine Gluconate Cloth  6 each Topical Q0600  . Chlorhexidine Gluconate Cloth  6 each Topical Q0600  . clopidogrel  75 mg Oral Daily  . [START ON 11/06/2018] colchicine  0.6 mg Oral Q14 Days  . heparin  1,000 Units Intravenous Q T,Th,Sa-HD  . heparin  5,000 Units Subcutaneous Q8H  . isosorbide mononitrate  120 mg Oral Daily  . multivitamin  1 tablet Oral Daily  . repaglinide  0.5 mg Oral BID AC  . rosuvastatin  10 mg Oral q1800  . sodium chloride flush  3 mL Intravenous Q12H    acetaminophen, albuterol, docusate sodium, gentamicin cream, ondansetron **OR** ondansetron (ZOFRAN) IV, polyethylene glycol, zolpidem  Iron/TIBC/Ferritin/ %Sat    Component Value Date/Time   IRON 25 (L) 10/27/2018 1950   TIBC 143 (L) 10/27/2018 1950   FERRITIN 1,613 (H) 10/27/2018 1950   IRONPCTSAT 18 10/27/2018 1950    Exam: General: WDWN NAD slightly anxious  Head: NCAT sclera not icteric MMM Neck: Supple.  Lungs: clear bilat Heart: RRR with S1 S2.  Abdomen: obese soft NT + BS PD cath exit clean Lower extremities:without edema or ischemic changes Neuro: A & O  X 3. Dialysis Access: left IJ TDC no drainage on dressing, +PD Cath  Dialysis Orders: PD  98kg 6 exchanges 2.5 fill dwel 1.5 hr dry day -  no VDRA last Mircera 50  3/3 13% sat ferritin 1546   Assessment/Plan: 1. Fatigue/ nausea/ vomiting - Pt was compliant w/ PD but not well dialyzed w creat 20 and symptoms that resolved after HD. Unfortunately will have to go back to hemodialysis. The PD cath can be removed in OP setting. Had bilat UE venogram for access planning by [facility].  Will use TDC for now and he will f/u w/ [facility] in OP setting for perm access issue. Pt CLIP'd to [name] MWF to start on Monday. Can go home today after short HD 2. ESRD on PD -  h/o failed transplant, was trying PD recently but failed as above. Back on HD. See above.  3. HTN/ vol - volume good now, BP's are on the soft side, would cont BP meds at dc (norvasc/ bisoprolol).  4. Anemia ckd- acute drop Hb 8.0, got darbe here x 100 ug x 1 on 4/1, cont in OP setting, prob IV Fe also (tsat 18% here) 5. Metabolic bone disease -  Acute hypocalcemia,, hx of remote parathyroidectomy so prone to low Ca levels. Got IV Ca here and Ca improved.  6. Nutrition - liberalized diet per pt requst 7. CAD  hx MI/CABG - stable 8. DM - per primary  [facility] 10/29/2018, 11:16 AM  Recent Labs  Lab 10/27/18 1511  10/28/18 0808 10/29/18 0350  NA 139   < > 134* 134*  K 2.9*   < > 4.0 3.8  CL 99  --  97* 97*  CO2 23  --  24 27  GLUCOSE 95  --  108* 62*  BUN 47*  --  22 13  CREATININE 20.04*   < > 11.65* 7.50*  CALCIUM 5.8*  --  7.0* 7.3*  ALBUMIN 2.6*  --   --   --   INR  --   --   --  1.2   < > = values in this interval not displayed.   Recent Labs  Lab 10/27/18 1511  AST 54*  ALT 34  ALKPHOS 59  BILITOT 0.8  PROT 7.3   Recent Labs  Lab 10/27/18 1511  10/28/18 0808 10/29/18 0350  WBC 5.4   < > 4.8 4.7  NEUTROABS 3.1  --   --   --   HGB 7.6*   < > 7.9* 8.0*  HCT 26.2*   < > 26.2* 26.4*  MCV 90.3   < > 90.3 91.0  PLT 155   < > 160 169   < > = values in this interval not displayed.

## 2018-10-29 NOTE — Progress Notes (Signed)
PROGRESS NOTE    Patrick [name]  OJJ:[id number] DOB: 1955/08/27 DOA: 10/27/2018 PCP: Nolene Ebbs, MD  Brief Narrative: 63 y.o.malewith medical history significant ofcoronary artery disease status post coronary artery bypass graft, end-stage renal disease disease on peritoneal dialysis, diabetes type 2, diastolic congestive heart failure, elevated troponin, and hypertension who presents to the emergency department complaining of shortness of breath. [place] 1 month ago patient was admitted to the hospital and found to have abdominal abscesses. He had extensive work-up and was told he had chronic hematomas. He was due to follow-up with multiple practitioners but as he has been doing well he did decline a couple of his follow-up appointments. He has been compliant with his peritoneal dialysis every night. He had an episode of vomiting yesterday. This morning he states he felt very short of breath he was weak and tired but has had no chest pain he has had no cough, he denies any fevers or chills. He has had no travel or sick contacts.  ED Course:Potassium found to be 2.8, calcium found to be 5.6,: An elevated at 0.48 twice his last measurement. EKG unchanged from prior"  10/28/2018: Patient seen.  Shortness of breath is improving.  No chest pain.  Rising troponin.  Discussed with cardiologist team, cardiology team will see patient in consultation.  Neurology input is highly appreciated.  Serum creatinine is down to 11.65.  Troponin peaked at one-point filter.  Potassium is 4.  Management depend on hospital course. Assessment & Plan:   Principal Problem:   Acute on chronic diastolic CHF (congestive heart failure) (HCC) Active Problems:   Essential hypertension   Acute gout   Secondary hyperparathyroidism ([place])   End-stage renal failure with renal transplant ([place])   Anemia in chronic kidney disease   Renal transplant rejection   Hypocalcemia   Dyspnea   S/P CABG x 1   Elevated troponin    PAD (peripheral artery disease) (HCC)   Hypokalemia   Acute on chronic diastolic (congestive) heart failure (HCC)  Pulmonary edema/acute on chronic diastolic congestive heart failure:  Possibly from ineffective PD.   Patient was diuresed last night.  Shortness of breath has improved.  No chest pain.  Rising troponin noted.  Continue hemodialysis per nephrology.     Rising troponin: On admission, troponin was 0.48, this rose to 0.77 and 1.13 subsequently.   No chest pain.   Appreciate cardiology input.  Plan outpatient nuclear study after discharge in approximately 8 weeks.  Catheterization in May 2019 medical therapy was recommended at that time.   Hypocalcemia: Status post repletion.  Patient has history of remote parathyroidectomy.  Hypokalemia: Resolved  End-stage renal failure on peritoneal dialysis prior to presentation:  Patient has had significant renal course.   Patient was previously on hemodialysis, and underwent kidney transplantation.   Kidney transplantation fail, patient was back to hemodialysis about 3 to 4 days.   Started on trial of peritoneal dialysis about 2 weeks ago.   Developed significant electrolyte abnormality with shortness of breath and fatigue.   Patient underwent hemodialysis yesterday, and feels a lot better.   Patient will likely continue hemodialysis. Status post bilateral upper extremity venogram  10/29/2018  Essential hypertension:  Continue to optimize.    Secondary hyperparathyroidism:  Will defer to nephrology.    Anemia and chronic kidney disease:  We will defer to nephrology team.   DVT prophylaxis: Subcu heparin Code Status: Full code Family Communication:  Disposition Plan: Home eventually Consultants:   Nephrology  Cardiology  Procedures:   Hemodialysis last night  Left IJ TDC  Antimicrobials:  None    Estimated body mass index is 33.1 kg/m as calculated from the following:   Height as of this  encounter: 5\' 6"  (1.676 m).   Weight as of this encounter: 93 kg.   Subjective: Very adamant about starting regular diet does not want renal diet no other complaints  Objective: Vitals:   10/29/18 0416 10/29/18 0440 10/29/18 0506 10/29/18 0916  BP: (!) 92/53   117/74  Pulse: 65   78  Resp: (!) 24 18    Temp: 98.8 F (37.1 C)   (!) 97.5 F (36.4 C)  TempSrc: Oral   Oral  SpO2: 100%   100%  Weight:   93 kg   Height:        Intake/Output Summary (Last 24 hours) at 10/29/2018 0941 Last data filed at 10/28/2018 2150 Gross per 24 hour  Intake 600 ml  Output 1906 ml  Net -1306 ml   Filed Weights   10/28/18 1812 10/28/18 2211 10/29/18 0506  Weight: 96 kg 93.7 kg 93 kg    Examination:  General exam: Appears calm and comfortable  Respiratory system: Clear to auscultation. Respiratory effort normal. Cardiovascular system: S1 & S2 heard, RRR. No JVD, murmurs, rubs, gallops or clicks. No pedal edema. Gastrointestinal system: Abdomen is distended, soft and nontender. No organomegaly or masses felt. Normal bowel sounds heard. Central nervous system: Alert and oriented. No focal neurological deficits. Extremities: Symmetric 5 x 5 power. Skin: No rashes, lesions or ulcers Psychiatry: Judgement and insight appear normal. Mood & affect appropriate.     Data Reviewed: I have personally reviewed following labs and imaging studies  CBC: Recent Labs  Lab 10/27/18 1511 10/27/18 1524 10/27/18 1702 10/28/18 0808 10/29/18 0350  WBC 5.4  --  4.9 4.8 4.7  NEUTROABS 3.1  --   --   --   --   HGB 7.6* 8.5* 7.2* 7.9* 8.0*  HCT 26.2* 25.0* 23.9* 26.2* 26.4*  MCV 90.3  --  88.8 90.3 91.0  PLT 155  --  149* 160 976   Basic Metabolic Panel: Recent Labs  Lab 10/27/18 1511 10/27/18 1524 10/27/18 1702 10/28/18 0808 10/29/18 0350  NA 139 141  --  134* 134*  K 2.9* 2.9*  --  4.0 3.8  CL 99  --   --  97* 97*  CO2 23  --   --  24 27  GLUCOSE 95  --   --  108* 62*  BUN 47*  --   --  22 13   CREATININE 20.04* >18.00* 20.18* 11.65* 7.50*  CALCIUM 5.8*  --   --  7.0* 7.3*   GFR: Estimated Creatinine Clearance: 10.6 mL/min (A) (by C-G formula based on SCr of 7.5 mg/dL (H)). Liver Function Tests: Recent Labs  Lab 10/27/18 1511  AST 54*  ALT 34  ALKPHOS 59  BILITOT 0.8  PROT 7.3  ALBUMIN 2.6*   No results for input(s): LIPASE, AMYLASE in the last 168 hours. No results for input(s): AMMONIA in the last 168 hours. Coagulation Profile: Recent Labs  Lab 10/29/18 0350  INR 1.2   Cardiac Enzymes: Recent Labs  Lab 10/27/18 1511 10/27/18 1702 10/28/18 0808  TROPONINI 0.48* 0.77* 1.13*   BNP (last 3 results) No results for input(s): PROBNP in the last 8760 hours. HbA1C: No results for input(s): HGBA1C in the last 72 hours. CBG: No results for input(s): GLUCAP in the last 168  hours. Lipid Profile: No results for input(s): CHOL, HDL, LDLCALC, TRIG, CHOLHDL, LDLDIRECT in the last 72 hours. Thyroid Function Tests: No results for input(s): TSH, T4TOTAL, FREET4, T3FREE, THYROIDAB in the last 72 hours. Anemia Panel: Recent Labs    10/27/18 1950  FERRITIN 1,613*  TIBC 143*  IRON 25*   Sepsis Labs: No results for input(s): PROCALCITON, LATICACIDVEN in the last 168 hours.  Recent Results (from the past 240 hour(s))  MRSA PCR Screening     Status: None   Collection Time: 10/28/18 12:10 AM  Result Value Ref Range Status   MRSA by PCR NEGATIVE NEGATIVE Final    Comment:        The GeneXpert MRSA Assay (FDA approved for NASAL specimens only), is one component of a comprehensive MRSA colonization surveillance program. It is not intended to diagnose MRSA infection nor to guide or monitor treatment for MRSA infections. Performed at [facility], [place] [address], [place], [place] [address]          Radiology Studies: Dg Chest Port 1 View  Result Date: 10/27/2018 CLINICAL DATA:  Shortness of breath. EXAM: PORTABLE CHEST 1 VIEW COMPARISON:  Radiograph  October 03, 2018. FINDINGS: Stable cardiomegaly. Left internal jugular dialysis catheter is unchanged in position. No pneumothorax is noted. Right lung is clear. Stable left basilar opacity is noted concerning for atelectasis and possible small effusion. Bony thorax is unremarkable. IMPRESSION: Stable left basilar opacity as described above. Electronically Signed   By: Marijo Conception, M.D.   On: 10/27/2018 15:26        Scheduled Meds: . amLODipine  5 mg Oral Daily  . aspirin EC  81 mg Oral Daily  . bisoprolol  5 mg Oral Daily  . calcium acetate  1,334 mg Oral TID WC  . Chlorhexidine Gluconate Cloth  6 each Topical Q0600  . clopidogrel  75 mg Oral Daily  . [START ON 11/06/2018] colchicine  0.6 mg Oral Q14 Days  . heparin  1,000 Units Intravenous Q T,Th,Sa-HD  . heparin  5,000 Units Subcutaneous Q8H  . isosorbide mononitrate  120 mg Oral Daily  . multivitamin  1 tablet Oral Daily  . repaglinide  0.5 mg Oral BID AC  . rosuvastatin  10 mg Oral q1800  . sodium chloride flush  3 mL Intravenous Q12H   Continuous Infusions:   LOS: 2 days     Georgette Shell, MD [facility]  If 7PM-7AM, please contact night-coverage [email or web address] Password [facility] 10/29/2018, 9:41 AM

## 2018-10-29 NOTE — Discharge Instructions (Signed)
Up at [facility] kidney care on Monday at 10:15 AM for continuation of dialysis Monday Wednesday Friday

## 2018-10-29 NOTE — Discharge Summary (Signed)
Physician Discharge Summary  Patrick [name] ZOX:[id number] DOB: 05/01/1956 DOA: 10/27/2018  PCP: Nolene Ebbs, MD  Admit date: 10/27/2018 Discharge date: 10/29/2018  Admitted From:HOME Disposition:HOME  Recommendations for Outpatient Follow-up:  1. Follow up with PCP in 1-2 weeks 2. Please obtain BMP/CBC in one week 3. Please follow with your nephrologist and [facility]  [place] Equipment/Devices:NONE  Discharge Condition:STABLE CODE STATUS:FULL Diet recommendation:RENAL Brief/Interim Summary:63 y.o.malewith medical history significant ofcoronary artery disease status post coronary artery bypass graft, end-stage renal disease disease on peritoneal dialysis, diabetes type 2, diastolic congestive heart failure, elevated troponin, and hypertension who presents to the emergency department complaining of shortness of breath. [place] 1 month ago patient was admitted to the hospital and found to have abdominal abscesses. He had extensive work-up and was told he had chronic hematomas. He was due to follow-up with multiple practitioners but as he has been doing well he did decline a couple of his follow-up appointments. He has been compliant with his peritoneal dialysis every night. He had an episode of vomiting yesterday. This morning he states he felt very short of breath he was weak and tired but has had no chest pain he has had no cough, he denies any fevers or chills. He has had no travel or sick contacts.  ED Course:Potassium found to be 2.8, calcium found to be 5.6,: An elevated at 0.48 twice his last measurement. EKG unchanged from prior" Discharge Diagnoses:  Principal Problem:   Acute on chronic diastolic CHF (congestive heart failure) (HCC) Active Problems:   Essential hypertension   Acute gout   Secondary hyperparathyroidism ([place])   End-stage renal failure with renal transplant ([place])   Anemia in chronic kidney disease   Renal transplant rejection    Hypocalcemia   Dyspnea   S/P CABG x 1   Elevated troponin   PAD (peripheral artery disease) (HCC)   Hypokalemia   Acute on chronic diastolic (congestive) heart failure (HCC)   Pulmonary edema/acute on chronic diastolic congestive heart failure: Possibly from ineffective PD.  Patient was diuresed last night.  Shortness of breath has improved.  No chest pain.  Rising troponin noted.  Continue hemodialysis per nephrology.    Rising troponin: On admission, troponin was 0.48, this rose to 0.77 and 1.13 subsequently.  No chest pain.  Appreciate cardiology input.  Plan outpatient nuclear study after discharge in approximately 8 weeks.  Catheterization in May 2019 medical therapy was recommended at that time.   Hypocalcemia: Status post repletion.  Patient has history of remote parathyroidectomy.  Hypokalemia: Resolved  End-stage renal failureon peritoneal dialysis prior to presentation:  Patient has had significant renal course.  Patient was previously on hemodialysis, and underwent kidney transplantation.  Kidney transplantation fail, patient was back to hemodialysis about 3 to 4 days.  Started on trial of peritoneal dialysis about 2 weeks ago.  Developed significant electrolyte abnormality with shortness of breath and fatigue.  Patient underwent hemodialysis yesterday, and feels a lot better.  Patient will likely continue hemodialysis. Status post bilateral upper extremity venogram  10/29/2018  Essential hypertension: Continue to optimize.  Secondary hyperparathyroidism: Will defer to nephrology.  Anemia and chronic kidney disease: We will defer to nephrology team.  Estimated body mass index is 33.1 kg/m as calculated from the following:   Height as of this encounter: 5\' 6"  (1.676 m).   Weight as of this encounter: 93 kg.  Discharge Instructions   Allergies as of 10/29/2018      Reactions  Iodinated Diagnostic Agents Swelling, Rash, Other  (See Comments)   Other Reaction: burning to mouth, swelling of lips   Lipitor [atorvastatin] Other (See Comments)   Leg pain   Metoprolol Other (See Comments)   Headaches    Baclofen Other (See Comments)   Possibly stroke like symptoms      Medication List    TAKE these medications   acetaminophen 500 MG tablet Commonly known as:  TYLENOL Take 1,000 mg by mouth every 6 (six) hours as needed for moderate pain or headache.   albuterol 108 (90 Base) MCG/ACT inhaler Commonly known as:  PROVENTIL HFA;VENTOLIN HFA Inhale 1-2 puffs into the lungs every 6 (six) hours as needed for wheezing or shortness of breath.   amLODipine 5 MG tablet Commonly known as:  NORVASC Take 1 tablet (5 mg total) by mouth daily.   aspirin EC 81 MG tablet Take 81 mg by mouth daily.   bisoprolol 5 MG tablet Commonly known as:  ZEBETA Take 1 tablet (5 mg total) by mouth daily.   calcium acetate 667 MG capsule Commonly known as:  PHOSLO Take 1,334 mg by mouth 3 (three) times daily with meals.   clopidogrel 75 MG tablet Commonly known as:  Plavix Take 1 tablet (75 mg total) by mouth daily.   Colchicine 0.6 MG Caps Take 0.6 mg by mouth every 14 (fourteen) days. For 14 days   docusate sodium 100 MG capsule Commonly known as:  COLACE Take 100 mg by mouth daily as needed for mild constipation.   gentamicin cream 0.1 % Commonly known as:  GARAMYCIN Apply 1 application topically daily as needed (prior to accessing PD catheter).   isosorbide mononitrate 60 MG 24 hr tablet Commonly known as:  IMDUR Take 2 tablets (120 mg total) by mouth daily.   multivitamin Tabs tablet Take 1 tablet by mouth daily.   repaglinide 0.5 MG tablet Commonly known as:  PRANDIN Take 1 tablet (0.5 mg total) by mouth 2 (two) times daily before a meal.   rosuvastatin 10 MG tablet Commonly known as:  CRESTOR Take 1 tablet (10 mg total) by mouth daily at 6 PM. What changed:  when to take this      Follow-up Information     Nolene Ebbs, MD Follow up.   Specialty:  Internal Medicine Contact information: [place] [address] [phone]        Fay Records, MD .   Specialty:  Cardiology Contact information: [address] [place] [address] [phone]        Constance Haw, MD .   Specialty:  Cardiology Contact information: [address] [place] [address] [phone]          Allergies  Allergen Reactions  . Iodinated Diagnostic Agents Swelling, Rash and Other (See Comments)    Other Reaction: burning to mouth, swelling of lips  . Lipitor [Atorvastatin] Other (See Comments)    Leg pain  . Metoprolol Other (See Comments)    Headaches   . Baclofen Other (See Comments)    Possibly stroke like symptoms    Consultations: Nephrology and vascular surgery Procedures/Studies: Ct Abdomen Pelvis Wo Contrast  Result Date: 10/01/2018 CLINICAL DATA:  Shortness of breath.  Abdominal distention. EXAM: CT ABDOMEN AND PELVIS WITHOUT CONTRAST TECHNIQUE: Multidetector CT imaging of the abdomen and pelvis was performed following the standard protocol without IV contrast. COMPARISON:  None. FINDINGS: Lower chest: Heart is enlarged. Coronary artery calcifications are  present. Mitral valve annular calcifications are present. Atherosclerotic changes are noted in the descending aorta. Mild bibasilar airspace disease is present. No significant pleural or pericardial effusion is present. Hepatobiliary: No focal liver abnormality is seen. No gallstones, gallbladder wall thickening, or biliary dilatation. Pancreas: Unremarkable. No pancreatic ductal dilatation or surrounding inflammatory changes. Spleen: Normal in size without focal abnormality. Adrenals/Urinary Tract: Adrenal glands are normal bilaterally. 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 Ureters are within normal limits. The urinary bladder is collapsed. Stomach/Bowel: A stomach and  duodenum are within normal limits. Proximal small bowel is unremarkable. There is focal inflammation of bowel loops of small bowel adjacent to a fluid collection along the anterior peritoneum on the right. The right anterior peritoneal fluid collection measures 12.9 x 6.5 x 14.6 cm. There is no obstruction. Terminal ileum is otherwise normal. The appendix is visualized and normal. The ascending and transverse colon is mostly collapsed. Diverticular changes are present in the descending and sigmoid colon without focal inflammation. Vascular/Lymphatic: Extensive vascular calcifications are present. There is no aneurysm. No significant adenopathy is present. Reproductive: Prostate is unremarkable. Other: Peritoneal dialysis catheter is in place. The anterior wall fluid collection seen on the previous study is stable. This is just anterior to the new collection. Elder collection measures 8.6 x 5.5 x 7.8 cm. There is some inflammatory change the subcutaneous tissues inferiorly. Musculoskeletal: Vertebral body heights and alignment are maintained. No focal lytic or blastic lesions are present. IMPRESSION: 1. New ventral peritoneal fluid collection measures 12.9 x 6.5 x 14.6 cm. This is worrisome for a loculated fluid collection associated with dialysis or developing abscess related to bowel disease. It appears separate from the other anterior abdominal wall fluid collection. 2. The more remote anterior wall fluid collection is stable. 3. Inflammatory changes of bowel adjacent to the fluid collection, likely reflecting adhesions. There is no obstruction. This is in the region of the previous hernia. 4. Dialysis catheter. 5. Aortic Atherosclerosis (ICD10-I70.0). Extensive vascular calcifications are present without aneurysm. 6. Cystic renal disease of dialysis. 7. Cardiomegaly without failure. 8. Coronary artery disease. Electronically Signed   By: San Morelle M.D.   On: 10/01/2018 21:40   Dg Chest Port 1  View  Result Date: 10/27/2018 CLINICAL DATA:  Shortness of breath. EXAM: PORTABLE CHEST 1 VIEW COMPARISON:  Radiograph October 03, 2018. FINDINGS: Stable cardiomegaly. Left internal jugular dialysis catheter is unchanged in position. No pneumothorax is noted. Right lung is clear. Stable left basilar opacity is noted concerning for atelectasis and possible small effusion. Bony thorax is unremarkable. IMPRESSION: Stable left basilar opacity as described above. Electronically Signed   By: Marijo Conception, M.D.   On: 10/27/2018 15:26   Dg Chest Port 1 View  Result Date: 10/01/2018 CLINICAL DATA:  Dyspnea for the past 2 hours. EXAM: PORTABLE CHEST 1 VIEW COMPARISON:  08/19/2018 FINDINGS: Stable cardiomegaly with aortic atherosclerosis. Dual lumen dialysis catheter terminates in the right atrium unchanged in position. Median sternotomy sutures and post surgical clips project over the cardiac silhouette. No overt pulmonary edema. Hazy appearance at the left lung base may represent a small layering left effusion. No acute osseous abnormality. Vascular clips project over the superior mediastinum as well as a vascular stent projecting over the right upper thorax in the region of the subclavian artery. IMPRESSION: Stable cardiomegaly. Hazy opacity at the left lung base may represent a small layering left effusion, new since prior. No overt pulmonary edema. No other change of note. Electronically Signed  By: Ashley Royalty M.D.   On: 10/01/2018 20:56   Ct Image Guided Fluid Drain By Catheter  Result Date: 10/02/2018 INDICATION: Right lower quadrant superficial and deep fluid collections EXAM: CT GUIDED DRAINAGE OF RIGHT LOWER QUADRANT SUPERFICIAL AND DEEP FLUID COLLECTIONS MEDICATIONS: The patient is currently admitted to the hospital and receiving intravenous antibiotics. The antibiotics were administered within an appropriate time frame prior to the initiation of the procedure. ANESTHESIA/SEDATION: 2.0 mg IV Versed 100 mcg  IV Fentanyl Moderate Sedation Time:  13 The patient was continuously monitored during the procedure by the interventional radiology nurse under my direct supervision. COMPLICATIONS: None immediate. TECHNIQUE: Informed written consent was obtained from the patient after a thorough discussion of the procedural risks, benefits and alternatives. All questions were addressed. Maximal Sterile Barrier Technique was utilized including caps, mask, sterile gowns, sterile gloves, sterile drape, hand hygiene and skin antiseptic. A timeout was performed prior to the initiation of the procedure. PROCEDURE: Previous imaging reviewed. Patient positioned supine. Noncontrast localization CT performed. The right lower quadrant superficial and deep abdominal wall fluid collections were localized. Overlying skin marked for drainage of both collections. Under sterile conditions and local anesthesia, 18 gauge 10 cm access needle were advanced into both the superficial and deep collections. Needle positions confirmed with CT. Syringe aspiration yielded thin dark fluid from both collections suspicious for chronic hematomas. Amplatz guidewire inserted followed by tract dilatation to insert 2 10 French drains. Both drain catheter positions confirmed with CT. Catheters secured with Prolene sutures and connected to external suction bulb. Patient tolerated the procedure well. No immediate complication. Sterile dressings applied. FINDINGS: Imaging confirms needle placement into the right lower quadrant superficial and deep fluid collections for drain placements IMPRESSION: Successful CT-guided drain placements (2) within the right lower quadrant superficial and deep abdominal wall fluid collections. Electronically Signed   By: Jerilynn Mages.  Shick M.D.   On: 10/02/2018 14:15   Ct Image Guided Fluid Drain By Catheter  Result Date: 10/02/2018 INDICATION: Right lower quadrant superficial and deep fluid collections EXAM: CT GUIDED DRAINAGE OF RIGHT LOWER  QUADRANT SUPERFICIAL AND DEEP FLUID COLLECTIONS MEDICATIONS: The patient is currently admitted to the hospital and receiving intravenous antibiotics. The antibiotics were administered within an appropriate time frame prior to the initiation of the procedure. ANESTHESIA/SEDATION: 2.0 mg IV Versed 100 mcg IV Fentanyl Moderate Sedation Time:  13 The patient was continuously monitored during the procedure by the interventional radiology nurse under my direct supervision. COMPLICATIONS: None immediate. TECHNIQUE: Informed written consent was obtained from the patient after a thorough discussion of the procedural risks, benefits and alternatives. All questions were addressed. Maximal Sterile Barrier Technique was utilized including caps, mask, sterile gowns, sterile gloves, sterile drape, hand hygiene and skin antiseptic. A timeout was performed prior to the initiation of the procedure. PROCEDURE: Previous imaging reviewed. Patient positioned supine. Noncontrast localization CT performed. The right lower quadrant superficial and deep abdominal wall fluid collections were localized. Overlying skin marked for drainage of both collections. Under sterile conditions and local anesthesia, 18 gauge 10 cm access needle were advanced into both the superficial and deep collections. Needle positions confirmed with CT. Syringe aspiration yielded thin dark fluid from both collections suspicious for chronic hematomas. Amplatz guidewire inserted followed by tract dilatation to insert 2 10 French drains. Both drain catheter positions confirmed with CT. Catheters secured with Prolene sutures and connected to external suction bulb. Patient tolerated the procedure well. No immediate complication. Sterile dressings applied. FINDINGS: Imaging confirms needle placement into  the right lower quadrant superficial and deep fluid collections for drain placements IMPRESSION: Successful CT-guided drain placements (2) within the right lower quadrant  superficial and deep abdominal wall fluid collections. Electronically Signed   By: Jerilynn Mages.  Shick M.D.   On: 10/02/2018 14:15    (Echo, Carotid, EGD, Colonoscopy, ERCP)    Subjective: ASKING FOR REGULAR DIET NO OTHER C/O  Discharge Exam: Vitals:   10/29/18 0916 10/29/18 1033  BP: 117/74 108/70  Pulse: 78 72  Resp:  20  Temp: (!) 97.5 F (36.4 C) (!) 97 F (36.1 C)  SpO2: 100% 100%   Vitals:   10/29/18 0440 10/29/18 0506 10/29/18 0916 10/29/18 1033  BP:   117/74 108/70  Pulse:   78 72  Resp: 18   20  Temp:   (!) 97.5 F (36.4 C) (!) 97 F (36.1 C)  TempSrc:   Oral Oral  SpO2:   100% 100%  Weight:  93 kg    Height:        General: Pt is alert, awake, not in acute distress Cardiovascular: RRR, S1/S2 +, no rubs, no gallops Respiratory: CTA bilaterally, no wheezing, no rhonchi Abdominal: Soft, NT, ND, bowel sounds + Extremities: no edema, no cyanosis    The results of significant diagnostics from this hospitalization (including imaging, microbiology, ancillary and laboratory) are listed below for reference.     Microbiology: Recent Results (from the past 240 hour(s))  MRSA PCR Screening     Status: None   Collection Time: 10/28/18 12:10 AM  Result Value Ref Range Status   MRSA by PCR NEGATIVE NEGATIVE Final    Comment:        The GeneXpert MRSA Assay (FDA approved for NASAL specimens only), is one component of a comprehensive MRSA colonization surveillance program. It is not intended to diagnose MRSA infection nor to guide or monitor treatment for MRSA infections. Performed at [facility], [place] [address] [place], [place] [address]      Labs: BNP (last 3 results) Recent Labs    10/02/18 0039 10/27/18 1511  BNP 3,561.2* 7,026.3*   Basic Metabolic Panel: Recent Labs  Lab 10/27/18 1511 10/27/18 1524 10/27/18 1702 10/28/18 0808 10/29/18 0350  NA 139 141  --  134* 134*  K 2.9* 2.9*  --  4.0 3.8  CL 99  --   --  97* 97*  CO2 23  --   --  24  27  GLUCOSE 95  --   --  108* 62*  BUN 47*  --   --  22 13  CREATININE 20.04* >18.00* 20.18* 11.65* 7.50*  CALCIUM 5.8*  --   --  7.0* 7.3*   Liver Function Tests: Recent Labs  Lab 10/27/18 1511  AST 54*  ALT 34  ALKPHOS 59  BILITOT 0.8  PROT 7.3  ALBUMIN 2.6*   No results for input(s): LIPASE, AMYLASE in the last 168 hours. No results for input(s): AMMONIA in the last 168 hours. CBC: Recent Labs  Lab 10/27/18 1511 10/27/18 1524 10/27/18 1702 10/28/18 0808 10/29/18 0350  WBC 5.4  --  4.9 4.8 4.7  NEUTROABS 3.1  --   --   --   --   HGB 7.6* 8.5* 7.2* 7.9* 8.0*  HCT 26.2* 25.0* 23.9* 26.2* 26.4*  MCV 90.3  --  88.8 90.3 91.0  PLT 155  --  149* 160 169   Cardiac Enzymes: Recent Labs  Lab 10/27/18 1511 10/27/18 1702 10/28/18 0808  TROPONINI 0.48*  0.77* 1.13*   BNP: Invalid input(s): POCBNP CBG: No results for input(s): GLUCAP in the last 168 hours. D-Dimer No results for input(s): DDIMER in the last 72 hours. Hgb A1c No results for input(s): HGBA1C in the last 72 hours. Lipid Profile No results for input(s): CHOL, HDL, LDLCALC, TRIG, CHOLHDL, LDLDIRECT in the last 72 hours. Thyroid function studies No results for input(s): TSH, T4TOTAL, T3FREE, THYROIDAB in the last 72 hours.  Invalid input(s): FREET3 Anemia work up Recent Labs    10/27/18 1950  FERRITIN 1,613*  TIBC 143*  IRON 25*   Urinalysis    Component Value Date/Time   COLORURINE RED (A) 11/17/2015 0910   APPEARANCEUR TURBID (A) 11/17/2015 0910   LABSPEC 1.035 (H) 11/17/2015 0910   PHURINE 6.0 11/17/2015 0910   GLUCOSEU 100 (A) 11/17/2015 0910   HGBUR LARGE (A) 11/17/2015 0910   BILIRUBINUR LARGE (A) 11/17/2015 0910   KETONESUR 40 (A) 11/17/2015 0910   PROTEINUR >300 (A) 11/17/2015 0910   UROBILINOGEN 0.2 11/24/2013 0242   NITRITE POSITIVE (A) 11/17/2015 0910   LEUKOCYTESUR LARGE (A) 11/17/2015 0910   Sepsis Labs Invalid input(s): PROCALCITONIN,  WBC,   LACTICIDVEN Microbiology Recent Results (from the past 240 hour(s))  MRSA PCR Screening     Status: None   Collection Time: 10/28/18 12:10 AM  Result Value Ref Range Status   MRSA by PCR NEGATIVE NEGATIVE Final    Comment:        The GeneXpert MRSA Assay (FDA approved for NASAL specimens only), is one component of a comprehensive MRSA colonization surveillance program. It is not intended to diagnose MRSA infection nor to guide or monitor treatment for MRSA infections. Performed at [facility], [place] [address], [place], [place] [address]      Time coordinating discharge:  33 minutes  SIGNED:   Georgette Shell, MD  [facility] 10/29/2018, 11:39 AM Pager   If 7PM-7AM, please contact night-coverage [email or web address] Password [email or web address]

## 2018-10-29 NOTE — Telephone Encounter (Signed)
TOC Patient    Please call Patient    Pt has an appointment with Vin on 11-08-18.

## 2018-10-29 NOTE — Progress Notes (Signed)
[facility]  Subjective  - No complaints.   Objective (!) 92/53 65 98.8 F (37.1 C) (Oral) 18 100%  Intake/Output Summary (Last 24 hours) at 10/29/2018 0730 Last data filed at 10/28/2018 2150 Gross per 24 hour  Intake 840 ml  Output 1906 ml  Net -1066 ml    Failed BUE grafts Palpable left radial pulse, no palpable right radial pulse  Laboratory Lab Results: Recent Labs    10/28/18 0808 10/29/18 0350  WBC 4.8 4.7  HGB 7.9* 8.0*  HCT 26.2* 26.4*  PLT 160 169   BMET Recent Labs    10/28/18 0808 10/29/18 0350  NA 134* 134*  K 4.0 3.8  CL 97* 97*  CO2 24 27  GLUCOSE 108* 62*  BUN 22 13  CREATININE 11.65* 7.50*  CALCIUM 7.0* 7.3*    COAG Lab Results  Component Value Date   INR 1.2 10/29/2018   INR 1.06 08/19/2018   INR 1.89 09/24/2016   No results found for: PTT  Assessment/Planning:  Bilateral upper extremity venograms and possible upper extremity arteriogram for access planning.    Marty Heck 10/29/2018 7:30 AM --

## 2018-10-30 ENCOUNTER — Emergency Department (HOSPITAL_COMMUNITY): Payer: Medicare Other

## 2018-10-30 ENCOUNTER — Other Ambulatory Visit: Payer: Self-pay

## 2018-10-30 ENCOUNTER — Encounter (HOSPITAL_COMMUNITY): Payer: Self-pay | Admitting: Emergency Medicine

## 2018-10-30 ENCOUNTER — Emergency Department (HOSPITAL_COMMUNITY)
Admission: EM | Admit: 2018-10-30 | Discharge: 2018-10-30 | Discharge status: Against Medical Advice | Payer: Medicare Other | Attending: Emergency Medicine | Admitting: Emergency Medicine

## 2018-10-30 DIAGNOSIS — J14 Pneumonia due to Hemophilus influenzae: Secondary | ICD-10-CM | POA: Diagnosis not present

## 2018-10-30 DIAGNOSIS — R0602 Shortness of breath: Secondary | ICD-10-CM | POA: Diagnosis not present

## 2018-10-30 DIAGNOSIS — I5033 Acute on chronic diastolic (congestive) heart failure: Secondary | ICD-10-CM | POA: Insufficient documentation

## 2018-10-30 DIAGNOSIS — Z87891 Personal history of nicotine dependence: Secondary | ICD-10-CM | POA: Diagnosis not present

## 2018-10-30 DIAGNOSIS — Z7982 Long term (current) use of aspirin: Secondary | ICD-10-CM | POA: Diagnosis not present

## 2018-10-30 DIAGNOSIS — R072 Precordial pain: Secondary | ICD-10-CM

## 2018-10-30 DIAGNOSIS — E1165 Type 2 diabetes mellitus with hyperglycemia: Secondary | ICD-10-CM | POA: Diagnosis not present

## 2018-10-30 DIAGNOSIS — N186 End stage renal disease: Secondary | ICD-10-CM | POA: Diagnosis not present

## 2018-10-30 DIAGNOSIS — N2581 Secondary hyperparathyroidism of renal origin: Secondary | ICD-10-CM | POA: Diagnosis not present

## 2018-10-30 DIAGNOSIS — I959 Hypotension, unspecified: Secondary | ICD-10-CM | POA: Diagnosis not present

## 2018-10-30 DIAGNOSIS — Z79899 Other long term (current) drug therapy: Secondary | ICD-10-CM | POA: Diagnosis not present

## 2018-10-30 DIAGNOSIS — I200 Unstable angina: Secondary | ICD-10-CM | POA: Insufficient documentation

## 2018-10-30 DIAGNOSIS — R079 Chest pain, unspecified: Secondary | ICD-10-CM | POA: Diagnosis not present

## 2018-10-30 DIAGNOSIS — R0902 Hypoxemia: Secondary | ICD-10-CM | POA: Diagnosis not present

## 2018-10-30 DIAGNOSIS — I132 Hypertensive heart and chronic kidney disease with heart failure and with stage 5 chronic kidney disease, or end stage renal disease: Secondary | ICD-10-CM | POA: Diagnosis not present

## 2018-10-30 LAB — BASIC METABOLIC PANEL
Anion gap: 10 (ref 5–15)
Anion gap: 15 (ref 5–15)
BUN: 13 mg/dL (ref 8–23)
BUN: 31 mg/dL — ABNORMAL HIGH (ref 8–23)
CO2: 27 mmol/L (ref 22–32)
CO2: 25 mmol/L (ref 22–32)
Calcium: 7.3 mg/dL — ABNORMAL LOW (ref 8.9–10.3)
Calcium: 7.4 mg/dL — ABNORMAL LOW (ref 8.9–10.3)
Chloride: 97 mmol/L — ABNORMAL LOW (ref 98–111)
Chloride: 95 mmol/L — ABNORMAL LOW (ref 98–111)
Creatinine, Ser: 7.5 mg/dL — ABNORMAL HIGH (ref 0.61–1.24)
Creatinine, Ser: 7.83 mg/dL — ABNORMAL HIGH (ref 0.61–1.24)
GFR calc Af Amer: 8 mL/min — ABNORMAL LOW (ref >60)
GFR calc Af Amer: 8 mL/min — ABNORMAL LOW (ref >60)
GFR calc non Af Amer: 7 mL/min — ABNORMAL LOW (ref >60)
GFR calc non Af Amer: 7 mL/min — ABNORMAL LOW (ref >60)
Glucose, Bld: 62 mg/dL — ABNORMAL LOW (ref 70–99)
Glucose, Bld: 242 mg/dL — ABNORMAL HIGH (ref 70–99)
Potassium: 3.8 mmol/L (ref 3.5–5.1)
Potassium: 3.9 mmol/L (ref 3.5–5.1)
Sodium: 134 mmol/L — ABNORMAL LOW (ref 135–145)
Sodium: 135 mmol/L (ref 135–145)

## 2018-10-30 LAB — CBC
HCT: 29.2 % — ABNORMAL LOW (ref 39.0–52.0)
Hemoglobin: 8.2 g/dL — ABNORMAL LOW (ref 13.0–17.0)
MCH: 26.8 pg (ref 26.0–34.0)
MCHC: 28.1 g/dL — ABNORMAL LOW (ref 30.0–36.0)
MCV: 95.4 fL (ref 80.0–100.0)
Platelets: 200 10*3/uL (ref 150–400)
RBC: 3.06 MIL/uL — ABNORMAL LOW (ref 4.22–5.81)
RDW: 17.9 % — ABNORMAL HIGH (ref 11.5–15.5)
WBC: 5.3 10*3/uL (ref 4.0–10.5)
nRBC: 0.6 % — ABNORMAL HIGH (ref 0.0–0.2)

## 2018-10-30 LAB — TROPONIN I: Troponin I: 0.33 ng/mL (ref <0.03)

## 2018-10-30 MED ORDER — ISOSORBIDE MONONITRATE ER 30 MG PO TB24: 30.0000 mg | ORAL_TABLET | Freq: Every day | ORAL | 0 refills | Status: DC

## 2018-10-30 MED ORDER — NITROGLYCERIN 2 % TD OINT
1.0000 [in_us] | TOPICAL_OINTMENT | Freq: Once | TRANSDERMAL | Status: AC
  Administered 2018-10-30: 1 [in_us] via TOPICAL
  Filled 2018-10-30: qty 1

## 2018-10-30 MED ORDER — SODIUM CHLORIDE 0.9% FLUSH: 3.0000 mL | Freq: Once | INTRAVENOUS | Status: DC

## 2018-10-30 NOTE — ED Provider Notes (Signed)
[facility] EMERGENCY DEPARTMENT Provider Note   CSN: [id number] Arrival date & time: 10/30/18  0600    History   Chief Complaint Chief Complaint  Patient presents with  . Chest Pain    HPI Patrick [name] is a 63 y.o. male.     Patient presents to the emergency department for evaluation of chest pain.  Patient reports that he was awakened from sleep approximately 3 hours before calling EMS.  Patient reports persistent pain in the left chest that radiates to the left arm.  He had some mild shortness of breath.  EMS administered aspirin and a single sublingual nitroglycerin.  He reports significant improvement but not resolution of his pain.  He will not given any additional nitroglycerin because they were unable to establish an IV.     Past Medical History:  Diagnosis Date  . Arthritis   . Atrial flutter ([place])    Ablation 2018  . 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 Erectile dysfunction   . ESRD (end stage renal disease) on dialysis ([place])    Pt on HD 2003 >> transplanted in 2009, back on HD in 2016. [name] TTS.   . Essential hypertension   . Gout   . Hernia of abdominal wall   . History of blood transfusion   . History of cardiomyopathy   . History of kidney stones   . History of pneumonia   . Migraine   . Myocardial infarction ([place])    1996  . Secondary hyperparathyroidism ([place])   . Type 2 diabetes mellitus ([place])   . Wears glasses     Patient Active Problem List   Diagnosis Date Noted  . Acute on chronic diastolic (congestive) heart failure ([place]) 10/27/2018  . SOB (shortness of breath) 10/02/2018  . Peritoneal abscess ([place]) 10/01/2018  . Nausea and vomiting 08/20/2018  . Chronic anemia 05/12/2018  . Hypokalemia 05/12/2018  . Acute combined systolic and diastolic  congestive heart failure ([place]) 05/12/2018  . Hyperlipidemia 05/12/2018  . Physical deconditioning 05/12/2018  . Multifocal pneumonia 05/11/2018  . Allergy to IVP dye, subsequent encounter   . CAD (coronary artery disease) 11/27/2017  . Atherosclerosis of native arteries of extremity with intermittent claudication ([place]) 04/03/2017  . PAD (peripheral artery disease) ([place]) 02/02/2017  . Typical atrial flutter ([place]) 09/24/2016  . PAF (paroxysmal atrial fibrillation) ([place]) 05/25/2016  . ESRD (end stage renal disease) ([place]) 05/11/2016  . Anemia of chronic disease   . End-stage renal disease on hemodialysis ([place])   . Renovascular hypertension   . History of MI (myocardial infarction)   . Acute respiratory failure with hypoxia ([place])   . Controlled diabetes mellitus type 2 with complications ([place])   . Pleural effusion   . Symptomatic anemia 01/22/2016  . Acute respiratory failure ([place]) 01/22/2016  . Acute on chronic diastolic CHF (congestive heart failure) ([place]) 01/22/2016  . Elevated troponin 01/22/2016  . Febrile illness 01/22/2016  . Chest pain 11/17/2015  . Normocytic anemia 11/17/2015  . Gynecomastia 11/17/2015  . Renal stone 11/17/2015  . Cough 11/17/2015  . Dyspnea 10/30/2015  . CAP (community acquired pneumonia)   . HCAP (healthcare-associated pneumonia) 10/11/2015  . Acute respiratory disease 10/11/2015  . Protein-calorie malnutrition, severe ([place]) 09/16/2014  . 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 Metabolic acidosis, NAG, failure of  bicarbonate regeneration 09/14/2014  . Anemia in chronic kidney disease 11/24/2013  . 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 Hypocalcemia 11/24/2013  . Low bicarbonate 11/24/2013  . Essential hypertension 03/19/2013  . Acute gout 03/19/2013  . Impotence of organic origin 03/19/2013  . Secondary hyperparathyroidism ([place]) 03/19/2013  . End-stage renal failure with renal transplant ([place]) 03/19/2013  . 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 A-FLUTTER ABLATION N/A 09/24/2016   Procedure: A-Flutter Ablation;  Surgeon: Will Meredith Leeds, MD;  Location: [facility];  Service: Cardiovascular;  Laterality: N/A;  . ABDOMINAL AORTOGRAM W/LOWER EXTREMITY N/A 04/13/2017   Procedure: ABDOMINAL AORTOGRAM W/LOWER EXTREMITY;  Surgeon: Conrad Shindler, MD;  Location: [facility];  Service: Cardiovascular;  Laterality: N/A;  Bilater lower extermity  . APPENDECTOMY    . AV FISTULA PLACEMENT Right 09/18/2014   Procedure: INSERTION OF ARTERIOVENOUS (AV) GORE-TEX GRAFT ARM USING 4-7MM  X 45CM STRETCH GORE-TEX VASCULAR GRAFT;  Surgeon: Rosetta Posner, MD;  Location: [facility];  Service: Vascular;  Laterality: Right;  . AV FISTULA PLACEMENT Left 07/07/2016   Procedure: INSERTION OF LEFT BRACHIAL TO AXILLARY ARTERIOVENOUS (AV) GORE-TEX ARM GRAFT;  Surgeon: Conrad [place], MD;  Location: [facility];  Service: Vascular;  Laterality: Left;  . CARDIAC CATHETERIZATION  ~ 2016  . COLONOSCOPY    . 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 EXCHANGE OF A DIALYSIS CATHETER Left 01/11/2018   Procedure: EXCHANGE OF TUNNELED DIALYSIS CATHETER;  Surgeon: Rosetta Posner, MD;  Location: [facility];  Service: Vascular;  Laterality: Left;  . HERNIA REPAIR  2017   with nephrectomy  . INSERTION OF DIALYSIS CATHETER N/A 10/08/2017   Procedure: INSERTION OF TUNNELED DIALYSIS CATHETER;  Surgeon: Conrad [place], MD;  Location: [facility];  Service: Vascular;  Laterality: N/A;  . IR FLUORO GUIDE CV LINE LEFT  03/12/2018  . IR GENERIC HISTORICAL  05/11/2016   IR FLUORO GUIDE CV LINE LEFT 05/11/2016 Corrie Mckusick, DO [facility]-INTER[facility]  . IR GENERIC HISTORICAL  05/11/2016   IR US GUIDE VASC ACCESS LEFT 05/11/2016 Corrie Mckusick, DO [facility]  . IR GENERIC HISTORICAL  05/11/2016   IR US GUIDE VASC ACCESS RIGHT 05/11/2016  Corrie Mckusick, DO [facility]  . IR GENERIC HISTORICAL  05/11/2016   IR RADIOLOGY PERIPHERAL GUIDED IV START 05/11/2016 Corrie Mckusick, DO [facility]-INTER[facility]  . KIDNEY TRANSPLANT  2009  . LEFT HEART CATH AND CORS/GRAFTS ANGIOGRAPHY N/A 12/04/2017   Procedure: LEFT HEART CATH AND CORS/GRAFTS ANGIOGRAPHY;  Surgeon: Belva Crome, MD;  Location: [facility];  Service: Cardiovascular;  Laterality: N/A;  . NEPHRECTOMY  2017   transplant rejected   . PERIPHERAL VASCULAR CATHETERIZATION N/A 06/04/2016   Procedure: Upper Extremity Venography;  Surgeon: Waynetta Sandy, MD;  Location: [facility];  Service: Cardiovascular;  Laterality: N/A;  . PERIPHERAL VASCULAR INTERVENTION  04/13/2017   Procedure: PERIPHERAL VASCULAR INTERVENTION;  Surgeon: Conrad [place], MD;  Location: [facility];  Service: Cardiovascular;;  Lt. Common/Exernal  Iliac  . THROMBECTOMY AND REVISION OF ARTERIOVENTOUS (AV) GORETEX  GRAFT Left 10/08/2017   Procedure: THROMBECTOMY of ARTERIOVENTOUS (AV) GORETEX  GRAFT LEFT UPPER ARM;  Surgeon: Conrad [place], MD;  Location: [facility];  Service: Vascular;  Laterality: Left;  . THROMBECTOMY  W/ EMBOLECTOMY Left 09/14/2017   Procedure: THROMBECTOMY ARTERIOVENOUS GORE-TEX GRAFT LEFT UPPER ARM;  Surgeon: Rosetta Posner, MD;  Location: [facility];  Service: Vascular;  Laterality: Left;  . UPPER EXTREMITY VENOGRAPHY N/A 11/16/2017   Procedure: UPPER EXTREMITY VENOGRAPHY - Right Arm;  Surgeon: Conrad [place], MD;  Location: [facility];  Service: Cardiovascular;  Laterality: N/A;        Home Medications    Prior to Admission medications   Medication Sig Start Date End Date Taking? Authorizing Provider  acetaminophen (TYLENOL) 500 MG tablet Take 1,000 mg by mouth every 6 (six) hours as needed for moderate pain or headache.    [provider]  albuterol (PROVENTIL HFA;VENTOLIN HFA) 108 (90 Base) MCG/ACT inhaler Inhale 1-2 puffs into the lungs every 6 (six) hours as  needed for wheezing or shortness of breath. 12/18/17   Richardson Dopp T, PA-C  amLODipine (NORVASC) 5 MG tablet Take 1 tablet (5 mg total) by mouth daily. 11/27/17 08/19/26  Richardson Dopp T, PA-C  aspirin EC 81 MG tablet Take 81 mg by mouth daily.    [provider]  bisoprolol (ZEBETA) 5 MG tablet Take 1 tablet (5 mg total) by mouth daily. 10/06/18   Eugenie Filler, MD  calcium acetate (PHOSLO) 667 MG capsule Take 1,334 mg by mouth 3 (three) times daily with meals.     [provider]  clopidogrel (PLAVIX) 75 MG tablet Take 1 tablet (75 mg total) by mouth daily. 12/18/17   Richardson Dopp T, PA-C  Colchicine 0.6 MG CAPS Take 0.6 mg by mouth every 14 (fourteen) days. For 14 days 10/13/18   [provider]  docusate sodium (COLACE) 100 MG capsule Take 100 mg by mouth daily as needed for mild constipation.    [provider]  gentamicin cream (GARAMYCIN) 0.1 % Apply 1 application topically daily as needed (prior to accessing PD catheter).  09/28/18   [provider]  isosorbide mononitrate (IMDUR) 60 MG 24 hr tablet Take 2 tablets (120 mg total) by mouth daily. 08/21/18   Hongalgi, Lenis Dickinson, MD  multivitamin (RENA-VIT) TABS tablet Take 1 tablet by mouth daily.    [provider]  repaglinide (PRANDIN) 0.5 MG tablet Take 1 tablet (0.5 mg total) by mouth 2 (two) times daily before a meal. 04/05/18   Renato Shin, MD  rosuvastatin (CRESTOR) 10 MG tablet Take 1 tablet (10 mg total) by mouth daily at 6 PM. Patient taking differently: Take 10 mg by mouth daily.  08/21/18   Hongalgi, Lenis Dickinson, MD    Family History Family History  Problem Relation Age of Onset  . Hyperlipidemia Mother   . Hypertension Mother   . HIV Sister     Social History Social History   Tobacco Use  . Smoking status: Former Smoker    Types: Cigarettes    Last attempt to quit: 07/04/2014    Years since quitting: 4.3  . Smokeless tobacco: Never Used  . Tobacco comment: "smoked ~ 1  pack/month when I did smoke; never a steady smoker"  Substance Use Topics  . Alcohol use: Yes    Alcohol/week: 0.0 standard drinks    Comment: rare  "2 drinks, 1-2 times/year"  . Drug use: No     Allergies   Iodinated diagnostic agents; Lipitor [atorvastatin]; Metoprolol; and Baclofen   Review of Systems Review of Systems  Cardiovascular: Positive for chest pain.  All other systems reviewed and are negative.    Physical  Exam Updated Vital Signs BP 124/80   Pulse 99   Temp 97.6 F (36.4 C) (Oral)   Resp 19   Ht 5' 6.5" (1.689 m)   Wt 93 kg   SpO2 100%   BMI 32.59 kg/m   Physical Exam Vitals signs and nursing note reviewed.  Constitutional:      General: He is not in acute distress.    Appearance: Normal appearance. He is well-developed.  HENT:     Head: Normocephalic and atraumatic.     Right Ear: Hearing normal.     Left Ear: Hearing normal.     Nose: Nose normal.  Eyes:     Conjunctiva/sclera: Conjunctivae normal.     Pupils: Pupils are equal, round, and reactive to light.  Neck:     Musculoskeletal: Normal range of motion and neck supple.  Cardiovascular:     Rate and Rhythm: Regular rhythm.     Heart sounds: S1 normal and S2 normal. No murmur. No friction rub. No gallop.   Pulmonary:     Effort: Pulmonary effort is normal. No respiratory distress.     Breath sounds: Normal breath sounds.  Chest:     Chest wall: No tenderness.  Abdominal:     General: Bowel sounds are normal.     Palpations: Abdomen is soft.     Tenderness: There is no abdominal tenderness. There is no guarding or rebound. Negative signs include Murphy's sign and McBurney's sign.     Hernia: No hernia is present.  Musculoskeletal: Normal range of motion.  Skin:    General: Skin is warm and dry.     Findings: No rash.  Neurological:     Mental Status: He is alert and oriented to person, place, and time.     GCS: GCS eye subscore is 4. GCS verbal subscore is 5. GCS motor subscore is  6.     Cranial Nerves: No cranial nerve deficit.     Sensory: No sensory deficit.     Coordination: Coordination normal.  Psychiatric:        Speech: Speech normal.        Behavior: Behavior normal.        Thought Content: Thought content normal.      ED Treatments / Results  Labs (all labs ordered are listed, but only abnormal results are displayed) Labs Reviewed  CBC - Abnormal; Notable for the following components:      Result Value   RBC 3.06 (*)    Hemoglobin 8.2 (*)    HCT 29.2 (*)    MCHC 28.1 (*)    RDW 17.9 (*)    nRBC 0.6 (*)    All other components within normal limits  BASIC METABOLIC PANEL  TROPONIN I    EKG EKG Interpretation  Date/Time:  Saturday October 30 2018 06:07:12 EDT Ventricular Rate:  99 PR Interval:    QRS Duration: 86 QT Interval:  386 QTC Calculation: 496 R Axis:   121 Text Interpretation:  Sinus rhythm Right axis deviation Repol abnrm suggests ischemia, anterolateral Confirmed by Orpah Greek [phone]) on 10/30/2018 6:11:04 AM Also confirmed by Orpah Greek [phone]), editor Philomena Doheny [phone])  on 10/30/2018 7:24:40 AM   Radiology Dg Chest 2 View  Result Date: 10/30/2018 CLINICAL DATA:  Per EMS, pt from home, work up about three hours ago w/ centralized sharp, non-radiating 10/10 chest pain. Initial bp was 160/100 which decreased to 100/60 after I nitro which decreased his pain three points.  CBG 3.24. Hx of CABG and MI in 1996. Pt attends dialysis on MWF, completed dialysis as planned on Friday. EXAM: CHEST - 2 VIEW COMPARISON:  10/27/2010 FINDINGS: Cardiac silhouette is mildly enlarged. Stable changes from previous cardiac surgery. No mediastinal or hilar masses. No evidence of adenopathy. Left internal jugular dual-lumen central venous catheter is stable. Lungs are clear.  No pleural effusion or pneumothorax. Skeletal structures are intact. IMPRESSION: No acute cardiopulmonary disease. Electronically Signed   By: Lajean Manes  M.D.   On: 10/30/2018 06:38    Procedures Procedures (including critical care time)  Medications Ordered in ED Medications  sodium chloride flush (NS) 0.9 % injection 3 mL (has no administration in time range)  nitroGLYCERIN (NITROGLYN) 2 % ointment 1 inch (1 inch Topical Given 10/30/18 0714)     Initial Impression / Assessment and Plan / ED Course  I have reviewed the triage vital signs and the nursing notes.  Pertinent labs & imaging results that were available during my care of the patient were reviewed by me and considered in my medical decision making (see chart for details).        Patient presents to the emergency department for evaluation of chest pain.  Patient was just discharged yesterday from the hospital after being evaluated for generalized weakness.  During that hospital stay he had an elevated troponin.  Cardiology felt that this was demand ischemia secondary to his trial of peritoneal dialysis.  He was restarted on hemodialysis and discharged.  He did not have any chest pain during his previous hospital stay.  EKG at arrival has been reviewed.  Patient does have ST depressions and T wave inversions in lateral leads.  Previous EKG did not have these changes.  This is concerning for ischemia.  Reviewing his records does reveal that he had a heart catheterization in 2019 that showed multiple areas of blockage with collaterals.  It was felt that intervention was not possible.  Medical management was recommended.  When reviewing his labs from his recent hospitalization, troponin was trending up at time of discharge.  Patient reports that his pain was 10 out of 10 at home.  After being given the sublingual nitroglycerin, aspirin and placed on oxygen his pain was 4 out of 10 at arrival.  Patient initiated on Nitropaste and he is now pain-free.  He will require hospitalization for unstable angina.  CRITICAL CARE Performed by: Orpah Greek   Total critical care time: 30  minutes  Critical care time was exclusive of separately billable procedures and treating other patients.  Critical care was necessary to treat or prevent imminent or life-threatening deterioration.  Critical care was time spent personally by me on the following activities: development of treatment plan with patient and/or surrogate as well as nursing, discussions with consultants, evaluation of patient's response to treatment, examination of patient, obtaining history from patient or surrogate, ordering and performing treatments and interventions, ordering and review of laboratory studies, ordering and review of radiographic studies, pulse oximetry and re-evaluation of patient's condition.   Final Clinical Impressions(s) / ED Diagnoses   Final diagnoses:  Unstable angina [facility])    ED Discharge Orders    None       Orpah Greek, MD 10/30/18 [phone]

## 2018-10-30 NOTE — Consult Note (Signed)
Cardiology Consult:   Patient ID: Patrick [name] MRN: [id number]; DOB: [birth year]   Admission date: 10/30/2018  Primary Care Provider: Nolene Ebbs, MD Primary Cardiologist: Dorris Carnes, MD  Primary Electrophysiologist:  Will Meredith Leeds, MD   Patrick [name] is a 63 y.o. male who is being seen today for the evaluation of chest pain at the request of Dr. Laverta Baltimore.  Patient Profile:   Patrick [name] is a 62 y.o. 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  His Rt AV fistula was closed and symptoms resolved.and regained LV function.  Neg nuc 08/2017 but + for scar.  Cath 2019 with angina atretic LIMA-LAD and an occluded LAD with left to left and left to right collaterals. He had moderate nonobstructive disease elsewhere. It was felt that his angina was related to his occluded LAD.  Discussion of CTO was undertaken but would be very difficult so medical therapy has been used.     History of Present Illness:   Patrick [name] with above hx was just discharged 10/29/18 for weakness and SOB. Additionally 1 month ago he had abdominal abscesses.  He was on peritoneal dialysis recent change from HD as trial but has been changed back to HD.  On recent admit he was hypokalemic at 2.8 and calcium at 5.6.  EKG was stable.  Echo 1/202 with normal LV function and mild diastolic dysfunction.  Mild AS mean gradient 11 mmHg, and trace aortic insuff.   Troponin on recent admit pk of 1.13.  This was likely demand ischemia in setting of renal insuff.   Pt has felt worse on peritoneal dialysis.  Plan had been medical therapy and stress test in 8 weeks.   Now presents by EMS with chest pain that woke him from sleep, central, sharp, non radiating but 10/10/  BP was 160/100 but with NTG down to down to 100/60.  His pain did improved     EKG:  The ECG that was done today  10/30/18 was personally reviewed and demonstrates SR and T wave inversion in lat leads that was present in March but not on recent EKGs.    Troponin 0.33,  This may be coming down from pk of 1.13 on 10/28/18 Hgb 8.2 (has been 8.0 to 7.2) and Hct 29.2 WBC 5.3 plts 200 Na 135, K+ 3.9 C)2 25  Glucose 242 BUN 31 Cr 7.83  Calcium 7.4,   His pain seems to come and go.  Past Medical History:  Diagnosis Date  . Arthritis   . Atrial flutter ([place])    Ablation 2018  . 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 Erectile dysfunction   . ESRD (end stage renal disease) on dialysis ([place])    Pt on HD 2003 >> transplanted in 2009, back on HD in 2016. [name]TS.   . Essential hypertension   . Gout   . Hernia of abdominal wall   . History of blood transfusion   . History of cardiomyopathy   . History of kidney stones   . History of pneumonia   . Migraine   . Myocardial infarction ([place])    1996  . Secondary hyperparathyroidism ([place])   .  Type 2 diabetes mellitus ([place])   . Wears glasses     Past Surgical History:  Procedure Laterality Date  . A-FLUTTER ABLATION N/A 09/24/2016   Procedure: A-Flutter Ablation;  Surgeon: Will Meredith Leeds, MD;  Location: [place] [facility];  Service: Cardiovascular;  Laterality: N/A;  . ABDOMINAL AORTOGRAM W/LOWER EXTREMITY N/A 04/13/2017   Procedure: ABDOMINAL AORTOGRAM W/LOWER EXTREMITY;  Surgeon: Conrad Dublin, MD;  Location: [place] [facility];  Service: Cardiovascular;  Laterality: N/A;  Bilater lower extermity  . APPENDECTOMY    . AV FISTULA PLACEMENT Right 09/18/2014   Procedure: INSERTION OF ARTERIOVENOUS (AV) GORE-TEX GRAFT ARM USING 4-7MM  X 45CM STRETCH GORE-TEX VASCULAR GRAFT;  Surgeon: Rosetta Posner, MD;  Location: [place];  Service: Vascular;  Laterality: Right;  . AV FISTULA PLACEMENT Left  07/07/2016   Procedure: INSERTION OF LEFT BRACHIAL TO AXILLARY ARTERIOVENOUS (AV) GORE-TEX ARM GRAFT;  Surgeon: Conrad Roscoe, MD;  Location: [place];  Service: Vascular;  Laterality: Left;  . CARDIAC CATHETERIZATION  ~ 2016  . COLONOSCOPY    . 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 EXCHANGE OF A DIALYSIS CATHETER Left 01/11/2018   Procedure: EXCHANGE OF TUNNELED DIALYSIS CATHETER;  Surgeon: Rosetta Posner, MD;  Location: [place];  Service: Vascular;  Laterality: Left;  . HERNIA REPAIR  2017   with nephrectomy  . INSERTION OF DIALYSIS CATHETER N/A 10/08/2017   Procedure: INSERTION OF TUNNELED DIALYSIS CATHETER;  Surgeon: Conrad Brownsville, MD;  Location: [place];  Service: Vascular;  Laterality: N/A;  . IR FLUORO GUIDE CV LINE LEFT  03/12/2018  . IR GENERIC HISTORICAL  05/11/2016   IR FLUORO GUIDE CV LINE LEFT 05/11/2016 Corrie Mckusick, DO [facility]  . IR GENERIC HISTORICAL  05/11/2016   IR US GUIDE VASC ACCESS LEFT 05/11/2016 Corrie Mckusick, DO [facility]  . IR GENERIC HISTORICAL  05/11/2016   IR US GUIDE VASC ACCESS RIGHT 05/11/2016 Corrie Mckusick, DO [facility]  . IR GENERIC HISTORICAL  05/11/2016   IR RADIOLOGY PERIPHERAL GUIDED IV START 05/11/2016 Corrie Mckusick, DO [facility]  . KIDNEY TRANSPLANT  2009  . LEFT HEART CATH AND CORS/GRAFTS ANGIOGRAPHY N/A 12/04/2017   Procedure: LEFT HEART CATH AND CORS/GRAFTS ANGIOGRAPHY;  Surgeon: Belva Crome, MD;  Location: [place] [facility];  Service: Cardiovascular;  Laterality: N/A;  . NEPHRECTOMY  2017   transplant rejected   . PERIPHERAL VASCULAR CATHETERIZATION N/A 06/04/2016   Procedure: Upper Extremity Venography;  Surgeon: Waynetta Sandy, MD;  Location: [facility];  Service: Cardiovascular;  Laterality: N/A;  . PERIPHERAL VASCULAR INTERVENTION  04/13/2017   Procedure: PERIPHERAL VASCULAR INTERVENTION;  Surgeon: Conrad [place], MD;  Location: [place]  [facility];  Service: Cardiovascular;;  Lt. Common/Exernal  Iliac  . THROMBECTOMY AND REVISION OF ARTERIOVENTOUS (AV) GORETEX  GRAFT Left 10/08/2017   Procedure: THROMBECTOMY of ARTERIOVENTOUS (AV) GORETEX  GRAFT LEFT UPPER ARM;  Surgeon: Conrad Old Tappan, MD;  Location: [place];  Service: Vascular;  Laterality: Left;  . THROMBECTOMY W/ EMBOLECTOMY Left 09/14/2017   Procedure: THROMBECTOMY ARTERIOVENOUS GORE-TEX GRAFT LEFT UPPER ARM;  Surgeon: Rosetta Posner, MD;  Location: [place];  Service: Vascular;  Laterality: Left;  . UPPER EXTREMITY VENOGRAPHY N/A 11/16/2017   Procedure: UPPER EXTREMITY VENOGRAPHY - Right Arm;  Surgeon: Conrad Quail, MD;  Location: [place] [facility];  Service: Cardiovascular;  Laterality: N/A;  Medications Prior to Admission: Prior to Admission medications   Medication Sig Start Date End Date Taking? Authorizing Provider  acetaminophen (TYLENOL) 500 MG tablet Take 1,000 mg by mouth every 6 (six) hours as needed for moderate pain or headache.    [provider]  albuterol (PROVENTIL HFA;VENTOLIN HFA) 108 (90 Base) MCG/ACT inhaler Inhale 1-2 puffs into the lungs every 6 (six) hours as needed for wheezing or shortness of breath. 12/18/17   Richardson Dopp T, PA-C  amLODipine (NORVASC) 5 MG tablet Take 1 tablet (5 mg total) by mouth [name]. 11/27/17 08/19/26  Richardson Dopp T, PA-C  aspirin EC 81 MG tablet Take 81 mg by mouth [name].    [provider]  bisoprolol (ZEBETA) 5 MG tablet Take 1 tablet (5 mg total) by mouth [name]. 10/06/18   Eugenie Filler, MD  calcium acetate (PHOSLO) 667 MG capsule Take 1,334 mg by mouth 3 (three) times [name] with meals.     [provider]  clopidogrel (PLAVIX) 75 MG tablet Take 1 tablet (75 mg total) by mouth [name]. 12/18/17   Richardson Dopp T, PA-C  Colchicine 0.6 MG CAPS Take 0.6 mg by mouth every 14 (fourteen) days. For 14 days 10/13/18   [provider]  docusate sodium (COLACE) 100 MG capsule Take 100 mg by mouth [name] as  needed for mild constipation.    [provider]  gentamicin cream (GARAMYCIN) 0.1 % Apply 1 application topically [name] as needed (prior to accessing PD catheter).  09/28/18   [provider]  isosorbide mononitrate (IMDUR) 60 MG 24 hr tablet Take 2 tablets (120 mg total) by mouth [name]. 08/21/18   Hongalgi, Lenis Dickinson, MD  multivitamin (RENA-VIT) TABS tablet Take 1 tablet by mouth [name].    [provider]  repaglinide (PRANDIN) 0.5 MG tablet Take 1 tablet (0.5 mg total) by mouth 2 (two) times [name] before a meal. 04/05/18   Renato Shin, MD  rosuvastatin (CRESTOR) 10 MG tablet Take 1 tablet (10 mg total) by mouth [name] at 6 PM. Patient taking differently: Take 10 mg by mouth [name].  08/21/18   Modena Jansky, MD     Allergies:    Allergies  Allergen Reactions  . Iodinated Diagnostic Agents Swelling, Rash and Other (See Comments)    Other Reaction: burning to mouth, swelling of lips  . Lipitor [Atorvastatin] Other (See Comments)    Leg pain  . Metoprolol Other (See Comments)    Headaches   . Baclofen Other (See Comments)    Possibly stroke like symptoms    Social History:   Social History   Socioeconomic History  . Marital status: Married    Spouse name: SHEILA  . Number of children: 5  . Years of education: 63  . Highest education level: 12th grade  Occupational History  . Occupation: DISABLED  Social Needs  . Financial resource strain: Not on file  . Food insecurity:    Worry: Never true    Inability: Never true  . Transportation needs:    Medical: No    Non-medical: No  Tobacco Use  . Smoking status: Former Smoker    Types: Cigarettes    Last attempt to quit: 07/04/2014    Years since quitting: 4.3  . Smokeless tobacco: Never Used  . Tobacco comment: "smoked ~ 1 pack/month when I did smoke; never a steady smoker"  Substance and Sexual Activity  . Alcohol use: Yes    Alcohol/week: 0.0 standard drinks  Comment: rare  "2 drinks, 1-2  times/year"  . Drug use: No  . Sexual activity: Not Currently    Birth control/protection: None  Lifestyle  . Physical activity:    Days per week: 0 days    Minutes per session: 0 min  . Stress: Not at all  Relationships  . Social connections:    Talks on phone: More than three times a week    Gets together: More than three times a week    Attends religious service: Never    Active member of club or organization: No    Attends meetings of clubs or organizations: Never    Relationship status: Married  . Intimate partner violence:    Fear of current or ex partner: No    Emotionally abused: No    Physically abused: No    Forced sexual activity: No  Other Topics Concern  . Not on file  Social History Narrative  . Not on file    Family History:   The patient's family history includes HIV in his sister; Hyperlipidemia in his mother; Hypertension in his mother.    ROS:  Please see the history of present illness.  Patient denies fevers, chills, productive cough or hemoptysis.  Previous weakness has resolved. All other ROS reviewed and negative.     Physical Exam/Data:   Vitals:   10/30/18 0616 10/30/18 0700 10/30/18 0730 10/30/18 0745  BP: 108/66 124/80 129/89 127/88  Pulse: 97 99 89 83  Resp: 15 19 (!) 22 20  Temp:      TempSrc:      SpO2: 100% 100% 100% 100%  Weight:      Height:       No intake or output data in the 24 hours ending 10/30/18 0839 Last 3 Weights 10/30/2018 10/29/2018 10/29/2018  Weight (lbs) 205 lb 206 lb 2.1 oz 207 lb 7.3 oz  Weight (kg) 92.987 kg 93.5 kg 94.1 kg     Body mass index is 32.59 kg/m.   Exam by Dr. Stanford Breed General:  Well nourished, well developed, in no acute distress HEENT: normal Neck: no JVD Endocrine:  No thryomegaly Vascular: No carotid bruits; FA pulses 2+ bilaterally without bruits  Cardiac:  normal S1, S2; RRR; 2/6 systolic murmur Lungs:  clear to auscultation bilaterally, no wheezing, rhonchi or rales  Abd: soft, nontender,  palpable mass, peritoneal dialysis catheter Ext: no edema Musculoskeletal:  No deformities, BUE and BLE strength normal and equal Skin: warm and dry  Neuro:  CNs 2-12 intact, no focal abnormalities noted Psych:  Normal affect      Relevant CV Studies: Cardiac cath 12/04/17  Atretic left internal mammary graft to the LAD   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  Total occlusion of the proximal to mid LAD filling late by left to left and right to left collaterals.  First diagonal contains 50% proximal narrowing.  Left main trunk is normal.  Circumflex gives origin to 3 obtuse marginal branches.  The first obtuse marginal contains 50% proximal narrowing.  Right coronary artery is dominant giving PDA and left ventricular branch.  Diffuse calcified 60% stenosis is noted throughout the mid vessel ending in the distal segment.  No high-grade focal obstruction is seen.  Left ventriculogram and LV pressures were not recorded.  RECOMMENDATIONS:   Angina is related to total occlusion of the proximal to mid LAD.  LIMA to the LAD is atretic.  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  Discussed with patient  Laboratory Data:  Chemistry Recent Labs  Lab 10/29/18 0350 10/30/18 0617  NA 134* 135  K 3.8 3.9  CL 97* 95*  CO2 27 25  GLUCOSE 62* 242*  BUN 13 31*  CREATININE 7.50* 7.83*  CALCIUM 7.3* 7.4*  GFRNONAA 7* 7*  GFRAA 8* 8*  ANIONGAP 10 15    Recent Labs  Lab 10/27/18 1511  PROT 7.3  ALBUMIN 2.6*  AST 54*  ALT 34  ALKPHOS 59  BILITOT 0.8   Hematology Recent Labs  Lab 10/29/18 0350 10/30/18 0617  WBC 4.7 5.3  RBC 2.90* 3.06*  HGB 8.0* 8.2*  HCT 26.4* 29.2*  MCV 91.0 95.4  MCH 27.6 26.8  MCHC 30.3 28.1*  RDW 17.8* 17.9*  PLT 169 200   Cardiac Enzymes  Recent Labs  Lab 10/27/18 1702 10/28/18 0808 10/30/18 0617  TROPONINI 0.77* 1.13* 0.33*   BNP Recent Labs  Lab 10/27/18 1511  BNP 3,129.6*    Radiology/Studies:  Dg Chest 2 View  Result Date: 10/30/2018 CLINICAL DATA:  Per EMS, pt from home, work up about three hours ago w/ centralized sharp, non-radiating 10/10 chest pain. Initial bp was 160/100 which decreased to 100/60 after I nitro which decreased his pain three points. CBG 3.24. Hx of CABG and MI in 1996. Pt attends dialysis on MWF, completed dialysis as planned on Friday. EXAM: CHEST - 2 VIEW COMPARISON:  10/27/2010 FINDINGS: Cardiac silhouette is mildly enlarged. Stable changes from previous cardiac surgery. No mediastinal or hilar masses. No evidence of adenopathy. Left internal jugular dual-lumen central venous catheter is stable. Lungs are clear.  No pleural effusion or pneumothorax. Skeletal structures are intact. IMPRESSION: No acute cardiopulmonary disease. Electronically Signed   By: Lajean Manes M.D.   On: 10/30/2018 06:38    Assessment and Plan:   1. Chest pain 10/10 on admit and some improvement with SL NTG  -difficult to know what troponin 0.33 means may be coming down from 1.13 on 10/28/18.  Would need serial troponin.  Add IV heparin and plan for lexiscan myoview vs cath.  Dr. Stanford Breed to see 2. CAD with hx CABG X 1 1997 LIMA to LAD and on cath 11/2017   occluded proximal LAD with left to left and right to left collaterals, 50% ostial D1, 50% OM1, 50% proximal RCA, 60% mid RCA and atretic LIMA to the LAD.  Medical therapy recommended.  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   3. ESRD on HD now, had tried peritoneal but did not feel well and electrolyte abnormalities.  4. Anemia per Nephrology and has improved slightly. 5. DM     6. HTN elevated originally but with NTG it came down.  7. HLD continue statin.     For questions or updates, please contact [facility] Please consult [email or web address] for contact info under        Signed, Cecilie Kicks, NP  10/30/2018 8:39 AM   As above, patient seen and examined.  Briefly he is a 63 year old male with past medical history of coronary artery disease, end-stage renal disease dialysis dependent, diabetes mellitus, hypertension, hyperlipidemia for evaluation of chest pain.  Just discharged yesterday following admission for weakness and dyspnea.  Troponin elevated but was felt to be demand  ischemia.  He had recently been changed from hemodialysis to peritoneal dialysis which he felt was making him feel worse.  He did not have chest pain at that time.  He was discharged yesterday and awoke this morning with pain in the left breast area without radiation.  Some increase with lying flat and improvement with sitting up.  Lasted 2 hours and resolved with aspirin and nitroglycerin.  No associated nausea, diaphoresis or dyspnea.  Presently pain-free.  Note patient states he feels much better after he has been changed back to hemodialysis.  Laboratories show troponin 0 0.33 which is lower than most recent admission after resuming hemodialysis. Electrocardiogram shows sinus rhythm, right axis deviation and increased T wave inversion in the lateral leads.  Hemoglobin 8.2.  1 chest pain-patient is presently pain-free.  His symptoms are somewhat atypical but troponin remains elevated.  This is the third consult for patient since March 7.  He has increased T wave inversions laterally.  I feel definitive evaluation is warranted and recommended cardiac catheterization.  The risks and benefits were discussed including myocardial infarction, CVA and death.  I also discussed the risk of not proceeding including undiagnosed coronary disease with risk of myocardial infarction and death.  He does not want to proceed with cath and understands the risk.  He feels as though nothing has changed since May 2019 at time of his previous  procedure.  He would like only medical therapy.  I also recommended admission for cycling of enzymes but he declined and understands the risk of death.  Would continue aspirin, Plavix, beta-blocker and statin.  Continue amlodipine as antianginal.  Add isosorbide 30 mg [name].  Hopefully his blood pressure will allow this medication to be continued.  We will arrange telephone visit in 1 week.  Patient should return to the emergency room if he changes his mind or has recurrent symptoms.  2 end-stage renal disease-hemodialysis reinitiated and patient feels much better.  Management per nephrology.  3 hypertension-continue present medications other than add isosorbide as outlined.  4 hyperlipidemia-continue statin.  Kirk Ruths, MD

## 2018-10-30 NOTE — Discharge Instructions (Signed)
You were seen in the ED today with Chest Pain. The Cardiology team wanted to admit you for monitoring and additional testing. You are choosing to leave against medical advice. They are recommending starting a new medication. Take as directed and follow up with them in the coming week as directed. Return to the ED at any time with worsening pain or other symptoms.

## 2018-10-30 NOTE — ED Notes (Signed)
  PIV attempted by two RNs but unsuccessful.  IV Team consult order placed.

## 2018-10-30 NOTE — ED Notes (Signed)
IV team at bedside 

## 2018-10-30 NOTE — ED Triage Notes (Signed)
Per EMS, pt from home, work up about three hours ago w/ centralized sharp, non-radiating 10/10  chest pain.  Initial bp was 160/100 which decreased to 100/60 after I nitro which decreased his pain three points.  CBG 3.24.  Hx of CABG and MI in 1996.  Pt attends dialysis on MWF, completed dialysis as planned on Friday

## 2018-10-30 NOTE — ED Notes (Signed)
Patient verbalizes understanding of discharge instructions. Opportunity for questioning and answers were provided. Armband removed by staff, pt discharged from ED.  

## 2018-10-30 NOTE — ED Notes (Signed)
  Patient states chest pain back up to 6-7 range.  Nitropaste placed on R upper chest.

## 2018-10-30 NOTE — ED Provider Notes (Signed)
Blood pressure 126/88, pulse 94, temperature 97.6 F (36.4 C), temperature source Oral, resp. rate (!) 21, height 5' 6.5" (1.689 m), weight 93 kg, SpO2 97 %.  Assuming care from Dr. Betsey Holiday.  In short, Patrick [name] is a 63 y.o. male with a chief complaint of Chest Pain .  Refer to the original H&P for additional details.  The current plan of care is to f/u after Cardiology evaluation.  10:00 AM  Cardiology recommending admit and further ACS w/u. Patient declining admission after Tommie Bohlken discussion with Cardiology. Discussed case with the patient. He elects to leave AMA. Made the medication adjustments recommended by Cardiology and reviewed ED return precautions in detail.    Margette Fast, MD 10/30/18 1944

## 2018-10-31 DIAGNOSIS — N186 End stage renal disease: Secondary | ICD-10-CM | POA: Diagnosis not present

## 2018-10-31 DIAGNOSIS — N2581 Secondary hyperparathyroidism of renal origin: Secondary | ICD-10-CM | POA: Diagnosis not present

## 2018-10-31 DIAGNOSIS — J14 Pneumonia due to Hemophilus influenzae: Secondary | ICD-10-CM | POA: Diagnosis not present

## 2018-11-01 ENCOUNTER — Encounter (HOSPITAL_COMMUNITY): Payer: Self-pay | Admitting: Vascular Surgery

## 2018-11-01 DIAGNOSIS — L299 Pruritus, unspecified: Secondary | ICD-10-CM | POA: Insufficient documentation

## 2018-11-01 DIAGNOSIS — D689 Coagulation defect, unspecified: Secondary | ICD-10-CM | POA: Insufficient documentation

## 2018-11-01 DIAGNOSIS — R52 Pain, unspecified: Secondary | ICD-10-CM | POA: Insufficient documentation

## 2018-11-01 DIAGNOSIS — N186 End stage renal disease: Secondary | ICD-10-CM | POA: Diagnosis not present

## 2018-11-01 DIAGNOSIS — T829XXA Unspecified complication of cardiac and vascular prosthetic device, implant and graft, initial encounter: Secondary | ICD-10-CM | POA: Insufficient documentation

## 2018-11-01 DIAGNOSIS — R197 Diarrhea, unspecified: Secondary | ICD-10-CM | POA: Insufficient documentation

## 2018-11-01 DIAGNOSIS — D631 Anemia in chronic kidney disease: Secondary | ICD-10-CM | POA: Diagnosis not present

## 2018-11-01 DIAGNOSIS — N2581 Secondary hyperparathyroidism of renal origin: Secondary | ICD-10-CM | POA: Diagnosis not present

## 2018-11-01 MED FILL — Lidocaine HCl Local Preservative Free (PF) Inj 1%: INTRAMUSCULAR | Qty: 30 | Status: AC

## 2018-11-01 MED FILL — Heparin Sod (Porcine)-NaCl IV Soln 1000 Unit/500ML-0.9%: INTRAVENOUS | Qty: 500 | Status: AC

## 2018-11-01 NOTE — Telephone Encounter (Signed)
Patient contacted regarding discharge from [facility] on Friday October 29, 2018.  Patient understands to follow up with Robbie Lis, PA on Monday November 08, 2018 at 11 AM-Video Visit-Covid 19 Patient understands discharge instructions? yes Patient understands medications and regiment? yes Patient understands to bring all medications to this visit?- Video Visit. Pt advised to have medications, weight and BP at time of Video Visit.  Pt states he has not had any chest pain/tightness since hospitalization, he knows to call our office if symptoms before Video Visit.

## 2018-11-02 ENCOUNTER — Telehealth: Payer: Self-pay | Admitting: Physician Assistant

## 2018-11-02 NOTE — Telephone Encounter (Signed)
Virtual Visit Pre-Appointment Phone Call  Steps For Call:  1. Confirm consent - "In the setting of the current Covid19 crisis, you are scheduled for a (phone or video) visit with your provider on (date) at (time).  Just as we do with many in-office visits, in order for you to participate in this visit, we must obtain consent.  If you'd like, I can send this to your mychart (if signed up) or email for you to review.  Otherwise, I can obtain your verbal consent now.  All virtual visits are billed to your insurance company just like a normal visit would be.  By agreeing to a virtual visit, we'd like you to understand that the technology does not allow for your provider to perform an examination, and thus may limit your provider's ability to fully assess your condition.  Finally, though the technology is pretty good, we cannot assure that it will always work on either your or our end, and in the setting of a video visit, we may have to convert it to a phone-only visit.  In either situation, we cannot ensure that we have a secure connection.  Are you willing to proceed?"  2. Give patient instructions for WebEx download to smartphone as below if video visit  3. Advise patient to be prepared with any vital sign or heart rhythm information, their current medicines, and a piece of paper and pen handy for any instructions they may receive the day of their visit  4. Inform patient they will receive a phone call 15 minutes prior to their appointment time (may be from unknown caller ID) so they should be prepared to answer  5. Confirm that appointment type is correct in Epic appointment notes (video vs telephone)    TELEPHONE CALL NOTE  Patrick [name] has been deemed a candidate for a follow-up tele-health visit to limit community exposure during the Covid-19 pandemic. I spoke with the patient via phone to ensure availability of phone/video source, confirm preferred email & phone number, and discuss  instructions and expectations.  I reminded Damien Batty Narain to be prepared with any vital sign and/or heart rhythm information that could potentially be obtained via home monitoring, at the time of his visit. I reminded Granger Chui Kallal to expect a phone call at the time of his visit if his visit.  Did the patient verbally acknowledge consent to treatment? 11/08/2018  Jeanann Lewandowsky, [place] 11/02/2018 2:50 PM   DOWNLOADING THE [place], go to [employer] and type in WebEx in the search bar. [place] [employer], the blue/green circle. The app is free but as with any other app downloads, their phone may require them to verify saved payment information or Apple password. The patient does NOT have to create an account.  - If Android, ask patient to go to [employer] and type in WebEx in the search bar. [place] [employer], the blue/green circle. The app is free but as with any other app downloads, their phone may require them to verify saved payment information or Android password. The patient does NOT have to create an account.   CONSENT FOR TELE-HEALTH VISIT - PLEASE REVIEW  I hereby voluntarily request, consent and authorize [facility] and its employed or contracted physicians, physician assistants, nurse practitioners or other licensed health care professionals (the Practitioner), to provide me with telemedicine health care services (the "Services") as deemed necessary by the treating Practitioner. I  acknowledge and consent to receive the Services by the Practitioner via telemedicine. I understand that the telemedicine visit will involve communicating with the Practitioner through live audiovisual communication technology and the disclosure of certain medical information by electronic transmission. I acknowledge that I have been given the opportunity to request an in-person assessment or other available alternative prior to the telemedicine visit  and am voluntarily participating in the telemedicine visit.  I understand that I have the right to withhold or withdraw my consent to the use of telemedicine in the course of my care at any time, without affecting my right to future care or treatment, and that the Practitioner or I may terminate the telemedicine visit at any time. I understand that I have the right to inspect all information obtained and/or recorded in the course of the telemedicine visit and may receive copies of available information for a reasonable fee.  I understand that some of the potential risks of receiving the Services via telemedicine include:  Marland Kitchen Delay or interruption in medical evaluation due to technological equipment failure or disruption; . Information transmitted may not be sufficient (e.g. poor resolution of images) to allow for appropriate medical decision making by the Practitioner; and/or  . In rare instances, security protocols could fail, causing a breach of personal health information.  Furthermore, I acknowledge that it is my responsibility to provide information about my medical history, conditions and care that is complete and accurate to the best of my ability. I acknowledge that Practitioner's advice, recommendations, and/or decision may be based on factors not within their control, such as incomplete or inaccurate data provided by me or distortions of diagnostic images or specimens that may result from electronic transmissions. I understand that the practice of medicine is not an exact science and that Practitioner makes no warranties or guarantees regarding treatment outcomes. I acknowledge that I will receive a copy of this consent concurrently upon execution via email to the email address I last provided but may also request a printed copy by calling the office of [place].    I understand that my insurance will be billed for this visit.   I have read or had this consent read to me. . I understand  the contents of this consent, which adequately explains the benefits and risks of the Services being provided via telemedicine.  . I have been provided ample opportunity to ask questions regarding this consent and the Services and have had my questions answered to my satisfaction. . I give my informed consent for the services to be provided through the use of telemedicine in my medical care  By participating in this telemedicine visit I agree to the above.

## 2018-11-02 NOTE — Telephone Encounter (Signed)
Spoke with patient via [place].  Confirmed all demographics and e-mail.  Patient is actively on MyChart.

## 2018-11-03 DIAGNOSIS — N2581 Secondary hyperparathyroidism of renal origin: Secondary | ICD-10-CM | POA: Diagnosis not present

## 2018-11-03 DIAGNOSIS — D689 Coagulation defect, unspecified: Secondary | ICD-10-CM | POA: Diagnosis not present

## 2018-11-03 DIAGNOSIS — D631 Anemia in chronic kidney disease: Secondary | ICD-10-CM | POA: Diagnosis not present

## 2018-11-03 DIAGNOSIS — N186 End stage renal disease: Secondary | ICD-10-CM | POA: Diagnosis not present

## 2018-11-05 DIAGNOSIS — D689 Coagulation defect, unspecified: Secondary | ICD-10-CM | POA: Diagnosis not present

## 2018-11-05 DIAGNOSIS — N186 End stage renal disease: Secondary | ICD-10-CM | POA: Diagnosis not present

## 2018-11-05 DIAGNOSIS — N2581 Secondary hyperparathyroidism of renal origin: Secondary | ICD-10-CM | POA: Diagnosis not present

## 2018-11-05 DIAGNOSIS — D631 Anemia in chronic kidney disease: Secondary | ICD-10-CM | POA: Diagnosis not present

## 2018-11-07 NOTE — Progress Notes (Signed)
Virtual Visit via Video Note   This visit type was conducted due to national recommendations for restrictions regarding the COVID-19 Pandemic (e.g. social distancing) in an effort to limit this patient's exposure and mitigate transmission in our community.  Due to his co-morbid illnesses, this patient is at least at moderate risk for complications without adequate follow up.  This format is felt to be most appropriate for this patient at this time.  All issues noted in this document were discussed and addressed.  A limited physical exam was performed with this format.  Please refer to the patient's chart for his consent to telehealth for [facility].   Evaluation Performed:  Follow-up visit  Date:  11/08/2018   ID:  Patrick [name], DOB February 25, 1956, MRN [id number]  Patient Location: Home  Provider Location: Home  PCP:  Nolene Ebbs, MD  Cardiologist:  Dorris Carnes, MD  Electrophysiologist:  Constance Haw, MD   Chief Complaint:  ER follow up  History of Present Illness:    Patrick [name] is a 63 y.o. male who presents via audio/video conferencing for a telehealth visit today.    The patient does not have symptoms concerning for COVID-19 infection (fever, chills, cough, or new shortness of breath).   Patrick [name] is a 63 y.o. 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  His Rt AV fistula was closed and symptoms resolved.and regained LV function.  Neg nuc 08/2017 but + for scar.  Cath May 2019 with angina atretic LIMA-LAD and an occluded LAD with left to left and left to right collaterals. He had moderate nonobstructive disease elsewhere. It was felt that his angina was related to his occluded LAD.  Discussion of CTO was undertaken but would be very difficult so medical therapy has been used.     Admitted 09/2018 with abdominal  abscess. Admitted 10/2018 with weakness and SOB. Found to have hypokalemia at 2.8. troponin peaked at 1.13, felt demand in setting of renal insufficiency. Recommended medical therapy with stress test in 8 weeks. However presented to ER next day after discharge for chest pain. Noted increased T wave inversions laterally. Troponin 0.33 which down from prior admission. Seen by Dr. Stanford Breed who recommended cardiac cath and admission but patient declined both (see consult note 10/30/18). Continued ASA, Plavix, BB, statin and amlodipine. Added Imdur 30mg  daily.   Today patient states that his chest pain has been improved on Imdur but never completely resolved.  Still has intermittent pain.  He has feeling of something stuck at epigastric area when he lays down.  Denies dyspnea, orthopnea, PND or lower extremity edema.  His volume is managed by dialysis.  He thinks his symptoms could be due to acid reflux but denies burning sensation.   Past Medical History:  Diagnosis Date  . Arthritis   . Atrial flutter ([place])    Ablation 2018  . 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 Erectile dysfunction   . ESRD (end stage renal disease) on dialysis ([place])    Pt on HD 2003 >> transplanted in 2009, back on HD in 2016. [name].   . Essential hypertension   . Gout   .  Hernia of abdominal wall   . History of blood transfusion   . History of cardiomyopathy   . History of kidney stones   . History of pneumonia   . Migraine   . Myocardial infarction ([place])    1996  . Secondary hyperparathyroidism ([place])   . Type 2 diabetes mellitus ([place])   . Wears glasses    Past Surgical History:  Procedure Laterality Date  . A-FLUTTER ABLATION N/A 09/24/2016   Procedure: A-Flutter Ablation;  Surgeon: Will Meredith Leeds, MD;  Location: [place] [facility];   Service: Cardiovascular;  Laterality: N/A;  . ABDOMINAL AORTOGRAM W/LOWER EXTREMITY N/A 04/13/2017   Procedure: ABDOMINAL AORTOGRAM W/LOWER EXTREMITY;  Surgeon: Conrad [place], MD;  Location: [place] [facility];  Service: Cardiovascular;  Laterality: N/A;  [place] lower extermity  . APPENDECTOMY    . AV FISTULA PLACEMENT Right 09/18/2014   Procedure: INSERTION OF ARTERIOVENOUS (AV) GORE-TEX GRAFT ARM USING 4-7MM  X 45CM STRETCH GORE-TEX VASCULAR GRAFT;  Surgeon: Rosetta Posner, MD;  Location: [place];  Service: Vascular;  Laterality: Right;  . AV FISTULA PLACEMENT Left 07/07/2016   Procedure: INSERTION OF LEFT BRACHIAL TO AXILLARY ARTERIOVENOUS (AV) GORE-TEX ARM GRAFT;  Surgeon: Conrad Addison, MD;  Location: [place];  Service: Vascular;  Laterality: Left;  . CARDIAC CATHETERIZATION  ~ 2016  . COLONOSCOPY    . 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 EXCHANGE OF A DIALYSIS CATHETER Left 01/11/2018   Procedure: EXCHANGE OF TUNNELED DIALYSIS CATHETER;  Surgeon: Rosetta Posner, MD;  Location: [place];  Service: Vascular;  Laterality: Left;  . HERNIA REPAIR  2017   with nephrectomy  . INSERTION OF DIALYSIS CATHETER N/A 10/08/2017   Procedure: INSERTION OF TUNNELED DIALYSIS CATHETER;  Surgeon: Conrad Wallace, MD;  Location: [place];  Service: Vascular;  Laterality: N/A;  . IR FLUORO GUIDE CV LINE LEFT  03/12/2018  . IR GENERIC HISTORICAL  05/11/2016   IR FLUORO GUIDE CV LINE LEFT 05/11/2016 Corrie Mckusick, DO [facility]  . IR GENERIC HISTORICAL  05/11/2016   IR US GUIDE VASC ACCESS LEFT 05/11/2016 Corrie Mckusick, DO [facility]  . IR GENERIC HISTORICAL  05/11/2016   IR US GUIDE VASC ACCESS RIGHT 05/11/2016 Corrie Mckusick, DO [facility]  . IR GENERIC HISTORICAL  05/11/2016   IR RADIOLOGY PERIPHERAL GUIDED IV START 05/11/2016 Corrie Mckusick, DO [facility]  . KIDNEY TRANSPLANT  2009  . LEFT HEART CATH AND CORS/GRAFTS ANGIOGRAPHY N/A  12/04/2017   Procedure: LEFT HEART CATH AND CORS/GRAFTS ANGIOGRAPHY;  Surgeon: Belva Crome, MD;  Location: [place] [facility];  Service: Cardiovascular;  Laterality: N/A;  . NEPHRECTOMY  2017   transplant rejected   . PERIPHERAL VASCULAR CATHETERIZATION N/A 06/04/2016   Procedure: Upper Extremity Venography;  Surgeon: Waynetta Sandy, MD;  Location: [place] [facility];  Service: Cardiovascular;  Laterality: N/A;  . PERIPHERAL VASCULAR INTERVENTION  04/13/2017   Procedure: PERIPHERAL VASCULAR INTERVENTION;  Surgeon: Conrad [place], MD;  Location: [place] [facility];  Service: Cardiovascular;;  Lt. Common/Exernal  Iliac  . THROMBECTOMY AND REVISION OF ARTERIOVENTOUS (AV) GORETEX  GRAFT Left 10/08/2017   Procedure: THROMBECTOMY of ARTERIOVENTOUS (AV) GORETEX  GRAFT LEFT UPPER ARM;  Surgeon: Conrad [place]side, MD;  Location: [place];  Service: Vascular;  Laterality: Left;  . THROMBECTOMY W/ EMBOLECTOMY Left 09/14/2017   Procedure: THROMBECTOMY ARTERIOVENOUS GORE-TEX GRAFT LEFT UPPER  ARM;  Surgeon: Rosetta Posner, MD;  Location: [place];  Service: Vascular;  Laterality: Left;  . UPPER EXTREMITY ANGIOGRAPHY Bilateral 10/29/2018   Procedure: UPPER EXTREMITY ANGIOGRAPHY;  Surgeon: Marty Heck, MD;  Location: [place] [facility];  Service: Cardiovascular;  Laterality: Bilateral;  . UPPER EXTREMITY VENOGRAPHY N/A 11/16/2017   Procedure: UPPER EXTREMITY VENOGRAPHY - Right Arm;  Surgeon: Conrad Zeba, MD;  Location: [place] [facility];  Service: Cardiovascular;  Laterality: N/A;  . UPPER EXTREMITY VENOGRAPHY Bilateral 10/29/2018   Procedure: UPPER EXTREMITY VENOGRAPHY;  Surgeon: Marty Heck, MD;  Location: [place] [facility];  Service: Cardiovascular;  Laterality: Bilateral;     Current Meds  Medication Sig  . acetaminophen (TYLENOL) 500 MG tablet Take 1,000 mg by mouth every 6 (six) hours as needed for moderate pain or headache.  . albuterol (PROVENTIL HFA;VENTOLIN HFA) 108 (90 Base) MCG/ACT  inhaler Inhale 1-2 puffs into the lungs every 6 (six) hours as needed for wheezing or shortness of breath.  Marland Kitchen amLODipine (NORVASC) 5 MG tablet Take 1 tablet (5 mg total) by mouth daily.  Marland Kitchen aspirin EC 81 MG tablet Take 81 mg by mouth daily.  . bisoprolol (ZEBETA) 5 MG tablet Take 1 tablet (5 mg total) by mouth daily.  . calcium acetate (PHOSLO) 667 MG capsule Take 1,334 mg by mouth 3 (three) times daily with meals.   . clopidogrel (PLAVIX) 75 MG tablet Take 1 tablet (75 mg total) by mouth daily.  . Colchicine 0.6 MG CAPS Take 0.6 mg by mouth every 14 (fourteen) days. For 14 days  . docusate sodium (COLACE) 100 MG capsule Take 100 mg by mouth daily as needed for mild constipation.  . isosorbide mononitrate (IMDUR) 30 MG 24 hr tablet Take 1 tablet (30 mg total) by mouth daily for 15 days.  . multivitamin (RENA-VIT) TABS tablet Take 1 tablet by mouth daily.  . repaglinide (PRANDIN) 0.5 MG tablet Take 1 tablet (0.5 mg total) by mouth 2 (two) times daily before a meal.  . rosuvastatin (CRESTOR) 10 MG tablet Take 10 mg by mouth daily.     Allergies:   Iodinated diagnostic agents; Lipitor [atorvastatin]; Metoprolol; and Baclofen   Social History   Tobacco Use  . Smoking status: Former Smoker    Types: Cigarettes    Last attempt to quit: 07/04/2014    Years since quitting: 4.3  . Smokeless tobacco: Never Used  . Tobacco comment: "smoked ~ 1 pack/month when I did smoke; never a steady smoker"  Substance Use Topics  . Alcohol use: Yes    Alcohol/week: 0.0 standard drinks    Comment: rare  "2 drinks, 1-2 times/year"  . Drug use: No     Family Hx: The patient's family history includes HIV in his sister; Hyperlipidemia in his mother; Hypertension in his mother.  ROS:   Please see the history of present illness.    All other systems reviewed and are negative.   Prior CV studies:   The following studies were reviewed today: Echo 08/20/2018 Study Conclusions  - Left ventricle: The cavity  size was normal. Wall thickness was   increased in a pattern of mild LVH. Systolic function was normal.   The estimated ejection fraction was in the range of 50% to 55%.   Wall motion was normal; there were no regional wall motion   abnormalities. Doppler parameters are consistent with abnormal   left ventricular relaxation (grade 1 diastolic dysfunction).   Doppler parameters are  consistent with high ventricular filling   pressure. - Aortic valve: There was mild stenosis. There was trivial   regurgitation. - Mitral valve: Severely calcified annulus.  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  Total occlusion of the proximal to mid LAD filling late by left to left and right to left collaterals.  First diagonal contains 50% proximal narrowing.  Left main trunk is normal.  Circumflex gives origin to 3 obtuse marginal branches.  The first obtuse marginal contains 50% proximal narrowing.  Right coronary artery is dominant giving PDA and left ventricular branch.  Diffuse calcified 60% stenosis is noted throughout the mid vessel ending in the distal segment.  No high-grade focal obstruction is seen.  Left ventriculogram and LV pressures were not recorded.  RECOMMENDATIONS:   Angina is related to total occlusion of the proximal to mid LAD.  LIMA to the LAD is atretic.  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  Discussed with patient.     Labs/Other Tests and Data Reviewed:    EKG:  No ECG reviewed.  Recent Labs: 05/12/2018: Magnesium 1.9 10/27/2018: ALT 34; B Natriuretic Peptide 3,129.6 10/30/2018: BUN 31; Creatinine, Ser 7.83; Hemoglobin 8.2; Platelets 200; Potassium 3.9;  Sodium 135   Recent Lipid Panel Lab Results  Component Value Date/Time   CHOL 176 08/20/2018 07:23 AM   CHOL 176 07/02/2018 07:35 AM   TRIG 179 (H) 08/20/2018 07:23 AM   HDL 45 08/20/2018 07:23 AM   HDL 39 (L) 07/02/2018 07:35 AM   CHOLHDL 3.9 08/20/2018 07:23 AM   LDLCALC 95 08/20/2018 07:23 AM   LDLCALC 89 07/02/2018 07:35 AM    Wt Readings from Last 3 Encounters:  11/08/18 208 lb (94.3 kg)  10/30/18 205 lb (93 kg)  10/29/18 206 lb 2.1 oz (93.5 kg)     Objective:    Vital Signs:  BP 121/70   Pulse 73   Ht 5' 6.5" (1.689 m)   Wt 208 lb (94.3 kg)   BMI 33.07 kg/m    Well nourished, well developed male in no acute distress. Good spirit.  Normal affect.  Answer question appropriately.  ASSESSMENT & PLAN:    1. Unstable angina with CAD - Last cath 11/2017 as above (high risk CTO). Recent chest pain and elevated troponin as summarized above (declined admission and cath)>> Added Imdur 30mg  daily.  His chest pain has improved but never resolved.  Will increase Imdur to 60 mg daily.  Continue aspirin, Plavix, bisoprolol and amlodipine.  2. HTN -Blood pressure stable on current medication  3. HLD -08/20/2018: Cholesterol 176; HDL 45; LDL Cholesterol 95; Triglycerides 179; VLDL 36   4. ESRD - per nephrology  5.  Feeling of something stuck at epigastric area -His volume managed by dialysis.  Less suspicious of volume overload as patient denies dyspnea, orthopnea, PND or lower extremity edema.  Trial of PPI.  COVID-19 Education: The signs and symptoms of COVID-19 were discussed with the patient and how to seek care for testing (follow up with PCP or arrange E-visit).  The importance of social distancing was discussed today.  Time:   Today, I have spent 15 minutes with the patient with telehealth  technology discussing the above problems.     Medication Adjustments/Labs and Tests Ordered: Current medicines are reviewed at length with the patient today.  Concerns regarding  medicines are outlined above.  Tests Ordered: No orders of the defined types were placed in this encounter.  Medication Changes: No orders of the defined types were placed in this encounter.   Disposition:  Follow up in 2 month(s)  Signed, Leanor Kail, PA  11/08/2018 11:19 AM    [place]

## 2018-11-08 ENCOUNTER — Telehealth: Payer: Medicare Other | Admitting: Physician Assistant

## 2018-11-08 ENCOUNTER — Telehealth (INDEPENDENT_AMBULATORY_CARE_PROVIDER_SITE_OTHER): Payer: Medicare Other | Admitting: Physician Assistant

## 2018-11-08 ENCOUNTER — Encounter: Payer: Self-pay | Admitting: Physician Assistant

## 2018-11-08 ENCOUNTER — Other Ambulatory Visit: Payer: Self-pay

## 2018-11-08 VITALS — BP 121/70 | HR 73 | Ht 66.5 in | Wt 208.0 lb

## 2018-11-08 DIAGNOSIS — Z7189 Other specified counseling: Secondary | ICD-10-CM

## 2018-11-08 DIAGNOSIS — D631 Anemia in chronic kidney disease: Secondary | ICD-10-CM | POA: Diagnosis not present

## 2018-11-08 DIAGNOSIS — N186 End stage renal disease: Secondary | ICD-10-CM

## 2018-11-08 DIAGNOSIS — D689 Coagulation defect, unspecified: Secondary | ICD-10-CM | POA: Diagnosis not present

## 2018-11-08 DIAGNOSIS — N2581 Secondary hyperparathyroidism of renal origin: Secondary | ICD-10-CM | POA: Diagnosis not present

## 2018-11-08 DIAGNOSIS — I25119 Atherosclerotic heart disease of native coronary artery with unspecified angina pectoris: Secondary | ICD-10-CM

## 2018-11-08 DIAGNOSIS — I10 Essential (primary) hypertension: Secondary | ICD-10-CM | POA: Diagnosis not present

## 2018-11-08 MED ORDER — PANTOPRAZOLE SODIUM 40 MG PO TBEC: 40.0000 mg | DELAYED_RELEASE_TABLET | Freq: Every day | ORAL | 3 refills | Status: DC

## 2018-11-08 MED ORDER — ISOSORBIDE MONONITRATE ER 60 MG PO TB24: 60.0000 mg | ORAL_TABLET | Freq: Every day | ORAL | 3 refills | Status: DC

## 2018-11-08 NOTE — Patient Instructions (Signed)
Medication Instructions:  Your physician has recommended you make the following change in your medication:  1.  INCREASE the Imdur to 60 mg taking 1 tablet daily. You may use 2 of the 30 mg tablets. 2.  START Omeprazole 40 mg taking 1 tablet daily.    If you need a refill on your cardiac medications before your next appointment, please call your pharmacy.   Lab work: None ordered  If you have labs (blood work) drawn today and your tests are completely normal, you will receive your results only by: Marland Kitchen MyChart Message (if you have MyChart) OR . A paper copy in the mail If you have any lab test that is abnormal or we need to change your treatment, we will call you to review the results.  Testing/Procedures: None ordered  Follow-Up: At [facility], you and your health needs are our priority.  As part of our continuing mission to provide you with exceptional heart care, we have created designated Provider Care Teams.  These Care Teams include your primary Cardiologist (physician) and Advanced Practice Providers (APPs -  Physician Assistants and Nurse Practitioners) who all work together to provide you with the care you need, when you need it. You will need a follow up appointment in:  2 months.  Please call our office 2 months in advance to schedule this appointment.  You may see Dorris Carnes, MD or one of the following Advanced Practice Providers on your designated Care Team: Richardson Dopp, PA-C [place], [place] . Daune Perch, NP  Any Other Special Instructions Will Be Listed Below (If Applicable).   Marland Kitchen

## 2018-11-10 ENCOUNTER — Encounter: Payer: Self-pay | Admitting: Vascular Surgery

## 2018-11-10 ENCOUNTER — Other Ambulatory Visit: Payer: Self-pay | Admitting: Vascular Surgery

## 2018-11-10 DIAGNOSIS — N2581 Secondary hyperparathyroidism of renal origin: Secondary | ICD-10-CM | POA: Diagnosis not present

## 2018-11-10 DIAGNOSIS — N186 End stage renal disease: Secondary | ICD-10-CM | POA: Diagnosis not present

## 2018-11-10 DIAGNOSIS — D689 Coagulation defect, unspecified: Secondary | ICD-10-CM | POA: Diagnosis not present

## 2018-11-10 DIAGNOSIS — D631 Anemia in chronic kidney disease: Secondary | ICD-10-CM | POA: Diagnosis not present

## 2018-11-11 DIAGNOSIS — N186 End stage renal disease: Secondary | ICD-10-CM | POA: Diagnosis not present

## 2018-11-11 DIAGNOSIS — T85611A Breakdown (mechanical) of intraperitoneal dialysis catheter, initial encounter: Secondary | ICD-10-CM | POA: Diagnosis not present

## 2018-11-12 DIAGNOSIS — D631 Anemia in chronic kidney disease: Secondary | ICD-10-CM | POA: Diagnosis not present

## 2018-11-12 DIAGNOSIS — N2581 Secondary hyperparathyroidism of renal origin: Secondary | ICD-10-CM | POA: Diagnosis not present

## 2018-11-12 DIAGNOSIS — D689 Coagulation defect, unspecified: Secondary | ICD-10-CM | POA: Diagnosis not present

## 2018-11-12 DIAGNOSIS — N186 End stage renal disease: Secondary | ICD-10-CM | POA: Diagnosis not present

## 2018-11-15 DIAGNOSIS — D631 Anemia in chronic kidney disease: Secondary | ICD-10-CM | POA: Diagnosis not present

## 2018-11-15 DIAGNOSIS — N186 End stage renal disease: Secondary | ICD-10-CM | POA: Diagnosis not present

## 2018-11-15 DIAGNOSIS — N2581 Secondary hyperparathyroidism of renal origin: Secondary | ICD-10-CM | POA: Diagnosis not present

## 2018-11-15 DIAGNOSIS — D689 Coagulation defect, unspecified: Secondary | ICD-10-CM | POA: Diagnosis not present

## 2018-11-17 DIAGNOSIS — N2581 Secondary hyperparathyroidism of renal origin: Secondary | ICD-10-CM | POA: Diagnosis not present

## 2018-11-17 DIAGNOSIS — D631 Anemia in chronic kidney disease: Secondary | ICD-10-CM | POA: Diagnosis not present

## 2018-11-17 DIAGNOSIS — N186 End stage renal disease: Secondary | ICD-10-CM | POA: Diagnosis not present

## 2018-11-17 DIAGNOSIS — D689 Coagulation defect, unspecified: Secondary | ICD-10-CM | POA: Diagnosis not present

## 2018-11-18 DIAGNOSIS — Z7982 Long term (current) use of aspirin: Secondary | ICD-10-CM | POA: Diagnosis not present

## 2018-11-18 DIAGNOSIS — Z992 Dependence on renal dialysis: Secondary | ICD-10-CM | POA: Diagnosis not present

## 2018-11-18 DIAGNOSIS — Z7902 Long term (current) use of antithrombotics/antiplatelets: Secondary | ICD-10-CM | POA: Diagnosis not present

## 2018-11-18 DIAGNOSIS — N185 Chronic kidney disease, stage 5: Secondary | ICD-10-CM | POA: Diagnosis not present

## 2018-11-18 DIAGNOSIS — N186 End stage renal disease: Secondary | ICD-10-CM | POA: Diagnosis not present

## 2018-11-18 DIAGNOSIS — Z4902 Encounter for fitting and adjustment of peritoneal dialysis catheter: Secondary | ICD-10-CM | POA: Diagnosis not present

## 2018-11-18 DIAGNOSIS — Z87891 Personal history of nicotine dependence: Secondary | ICD-10-CM | POA: Diagnosis not present

## 2018-11-18 DIAGNOSIS — T85691A Other mechanical complication of intraperitoneal dialysis catheter, initial encounter: Secondary | ICD-10-CM | POA: Diagnosis not present

## 2018-11-19 DIAGNOSIS — D631 Anemia in chronic kidney disease: Secondary | ICD-10-CM | POA: Diagnosis not present

## 2018-11-19 DIAGNOSIS — D689 Coagulation defect, unspecified: Secondary | ICD-10-CM | POA: Diagnosis not present

## 2018-11-19 DIAGNOSIS — N186 End stage renal disease: Secondary | ICD-10-CM | POA: Diagnosis not present

## 2018-11-19 DIAGNOSIS — N2581 Secondary hyperparathyroidism of renal origin: Secondary | ICD-10-CM | POA: Diagnosis not present

## 2018-11-22 DIAGNOSIS — D689 Coagulation defect, unspecified: Secondary | ICD-10-CM | POA: Diagnosis not present

## 2018-11-22 DIAGNOSIS — N186 End stage renal disease: Secondary | ICD-10-CM | POA: Diagnosis not present

## 2018-11-22 DIAGNOSIS — N2581 Secondary hyperparathyroidism of renal origin: Secondary | ICD-10-CM | POA: Diagnosis not present

## 2018-11-22 DIAGNOSIS — D631 Anemia in chronic kidney disease: Secondary | ICD-10-CM | POA: Diagnosis not present

## 2018-11-24 DIAGNOSIS — N2581 Secondary hyperparathyroidism of renal origin: Secondary | ICD-10-CM | POA: Diagnosis not present

## 2018-11-24 DIAGNOSIS — D689 Coagulation defect, unspecified: Secondary | ICD-10-CM | POA: Diagnosis not present

## 2018-11-24 DIAGNOSIS — D631 Anemia in chronic kidney disease: Secondary | ICD-10-CM | POA: Diagnosis not present

## 2018-11-24 DIAGNOSIS — N186 End stage renal disease: Secondary | ICD-10-CM | POA: Diagnosis not present

## 2018-11-26 DIAGNOSIS — N186 End stage renal disease: Secondary | ICD-10-CM | POA: Diagnosis not present

## 2018-11-26 DIAGNOSIS — D631 Anemia in chronic kidney disease: Secondary | ICD-10-CM | POA: Diagnosis not present

## 2018-11-26 DIAGNOSIS — N2581 Secondary hyperparathyroidism of renal origin: Secondary | ICD-10-CM | POA: Diagnosis not present

## 2018-11-26 DIAGNOSIS — T8610 Unspecified complication of kidney transplant: Secondary | ICD-10-CM | POA: Diagnosis not present

## 2018-11-26 DIAGNOSIS — Z992 Dependence on renal dialysis: Secondary | ICD-10-CM | POA: Diagnosis not present

## 2018-11-29 DIAGNOSIS — N186 End stage renal disease: Secondary | ICD-10-CM | POA: Diagnosis not present

## 2018-11-29 DIAGNOSIS — N2581 Secondary hyperparathyroidism of renal origin: Secondary | ICD-10-CM | POA: Diagnosis not present

## 2018-11-29 DIAGNOSIS — D631 Anemia in chronic kidney disease: Secondary | ICD-10-CM | POA: Diagnosis not present

## 2018-12-01 DIAGNOSIS — N186 End stage renal disease: Secondary | ICD-10-CM | POA: Diagnosis not present

## 2018-12-01 DIAGNOSIS — D631 Anemia in chronic kidney disease: Secondary | ICD-10-CM | POA: Diagnosis not present

## 2018-12-01 DIAGNOSIS — N2581 Secondary hyperparathyroidism of renal origin: Secondary | ICD-10-CM | POA: Diagnosis not present

## 2018-12-03 ENCOUNTER — Other Ambulatory Visit: Payer: Self-pay | Admitting: *Deleted

## 2018-12-03 DIAGNOSIS — N186 End stage renal disease: Secondary | ICD-10-CM | POA: Diagnosis not present

## 2018-12-03 DIAGNOSIS — D631 Anemia in chronic kidney disease: Secondary | ICD-10-CM | POA: Diagnosis not present

## 2018-12-03 DIAGNOSIS — N2581 Secondary hyperparathyroidism of renal origin: Secondary | ICD-10-CM | POA: Diagnosis not present

## 2018-12-06 DIAGNOSIS — N2581 Secondary hyperparathyroidism of renal origin: Secondary | ICD-10-CM | POA: Diagnosis not present

## 2018-12-06 DIAGNOSIS — D631 Anemia in chronic kidney disease: Secondary | ICD-10-CM | POA: Diagnosis not present

## 2018-12-06 DIAGNOSIS — N186 End stage renal disease: Secondary | ICD-10-CM | POA: Diagnosis not present

## 2018-12-08 ENCOUNTER — Other Ambulatory Visit: Payer: Self-pay

## 2018-12-08 ENCOUNTER — Other Ambulatory Visit (HOSPITAL_COMMUNITY)
Admission: RE | Admit: 2018-12-08 | Discharge: 2018-12-08 | Disposition: A | Discharge status: Home / Self Care | Payer: Medicare Other | Source: Ambulatory Visit | Attending: Vascular Surgery | Admitting: Vascular Surgery

## 2018-12-08 ENCOUNTER — Encounter (HOSPITAL_COMMUNITY): Payer: Self-pay | Admitting: *Deleted

## 2018-12-08 DIAGNOSIS — Z1159 Encounter for screening for other viral diseases: Secondary | ICD-10-CM | POA: Insufficient documentation

## 2018-12-08 DIAGNOSIS — N2581 Secondary hyperparathyroidism of renal origin: Secondary | ICD-10-CM | POA: Diagnosis not present

## 2018-12-08 DIAGNOSIS — N186 End stage renal disease: Secondary | ICD-10-CM | POA: Diagnosis not present

## 2018-12-08 DIAGNOSIS — D631 Anemia in chronic kidney disease: Secondary | ICD-10-CM | POA: Diagnosis not present

## 2018-12-08 LAB — SARS CORONAVIRUS 2 BY RT PCR (HOSPITAL ORDER, PERFORMED IN ~~LOC~~ HOSPITAL LAB): SARS Coronavirus 2: NEGATIVE

## 2018-12-08 NOTE — Anesthesia Preprocedure Evaluation (Addendum)
Anesthesia Evaluation  Patient identified by MRN, date of birth, ID band Patient awake    Reviewed: Allergy & Precautions, NPO status , Patient's Chart, lab work & pertinent test results  Airway Mallampati: II  TM Distance: >3 FB Neck ROM: Full    Dental  (+) Dental Advisory Given   Pulmonary shortness of breath, pneumonia, former smoker,    Pulmonary exam normal breath sounds clear to auscultation       Cardiovascular hypertension, + angina + CAD, + Past MI, + Peripheral Vascular Disease and +CHF  Normal cardiovascular exam+ Valvular Problems/Murmurs AS  Rhythm:Regular Rate:Normal  Echo 07/2018 - Left ventricle: The cavity size was normal. Wall thickness was increased in a pattern of mild LVH. Systolic function was normal. The estimated ejection fraction was in the range of 50% to 55%. Wall motion was normal; there were no regional wall motion abnormalities. Doppler parameters are consistent with abnormal left ventricular relaxation (grade 1 diastolic dysfunction). Doppler parameters are consistent with high ventricular filling  pressure. - Aortic valve: There was mild stenosis. There was trivial regurgitation. - Mitral valve: Severely calcified annulus.  Impressions: - Normal LV systolic function; mild LVH: mild diastolic   dysfunction; calcified aortic valve with mild AS (mean gradient  11 mmHg) and trace AI.   Neuro/Psych  Headaches, negative psych ROS   GI/Hepatic negative GI ROS, Neg liver ROS,   Endo/Other  diabetes  Renal/GU Renal disease  negative genitourinary   Musculoskeletal  (+) Arthritis ,   Abdominal (+) + obese,   Peds  Hematology negative hematology ROS (+) anemia ,   Anesthesia Other Findings Day of surgery medications reviewed with the patient.  Reproductive/Obstetrics negative OB ROS                                                            Anesthesia Evaluation  Patient  identified by MRN, date of birth, ID band Patient awake    Reviewed: Allergy & Precautions, NPO status , Patient's Chart, lab work & pertinent test results  Airway Mallampati: III  TM Distance: >3 FB Neck ROM: Full    Dental no notable dental hx. (+) Dental Advisory Given   Pulmonary former smoker,    Pulmonary exam normal breath sounds clear to auscultation       Cardiovascular hypertension, + CAD and + Past MI  Normal cardiovascular exam Rhythm:Regular Rate:Normal  ECG: NSR, rate 71  Nuclear stress EF: 53%. There is septal wall and apical hypokinesis There was no ST segment deviation noted during stress. Nonspecific T wave changes noted at baseline. Defect 1: There is a medium defect of severe severity present in the mid anteroseptal, apical anterior, apical septal, apical lateral and apex location. Findings consistent with prior myocardial infarction. No significant ischemia identified. This is an intermediate risk study.   Neuro/Psych  Headaches, negative psych ROS   GI/Hepatic negative GI ROS, Neg liver ROS,   Endo/Other  diabetes  Renal/GU ESRF and DialysisRenal diseaseOn HD T, R, Sat     Musculoskeletal negative musculoskeletal ROS (+)   Abdominal (+) + obese,   Peds  Hematology  (+) anemia ,   Anesthesia Other Findings   Reproductive/Obstetrics  Anesthesia Physical  Anesthesia Plan  ASA: III  Anesthesia Plan: MAC   Post-op Pain Management:    Induction: Intravenous  PONV Risk Score and Plan: 2 and Ondansetron, Treatment may vary due to age or medical condition and Propofol infusion  Airway Management Planned: Natural Airway  Additional Equipment:   Intra-op Plan:   Post-operative Plan: Extubation in OR  Informed Consent: I have reviewed the patients History and Physical, chart, labs and discussed the procedure including the risks, benefits and alternatives for the proposed  anesthesia with the patient or authorized representative who has indicated his/her understanding and acceptance.   Dental advisory given  Plan Discussed with: CRNA and Anesthesiologist  Anesthesia Plan Comments:         Anesthesia Quick Evaluation  Anesthesia Physical Anesthesia Plan  ASA: IV  Anesthesia Plan: MAC   Post-op Pain Management:    Induction: Intravenous  PONV Risk Score and Plan: Propofol infusion, Ondansetron and Treatment may vary due to age or medical condition  Airway Management Planned:   Additional Equipment:   Intra-op Plan:   Post-operative Plan:   Informed Consent: I have reviewed the patients History and Physical, chart, labs and discussed the procedure including the risks, benefits and alternatives for the proposed anesthesia with the patient or authorized representative who has indicated his/her understanding and acceptance.     Dental advisory given  Plan Discussed with: CRNA  Anesthesia Plan Comments: (PAT note written 12/08/2018 by Myra Gianotti, PA-C. )       Anesthesia Quick Evaluation

## 2018-12-08 NOTE — Progress Notes (Signed)
Anesthesia Chart Review: SAME DAY WORK-UP   Case:  [id number] Date/Time:  12/09/18 0715   Procedures:      ARTERIOVENOUS (AV) GRAFT CREATION (Left )     INSERTION OF DIALYSIS CATHETER (Left Groin)   Anesthesia type:  Choice   Pre-op diagnosis:  END-STAGE RENAL DISEASE   Location:  [facility] OR ROOM 10 / [place] [facility]   Surgeon:  Marty Heck, MD      DISCUSSION: Patient is a 63 year old male scheduled for the above procedure. Notes indicate patient has a working [facility] (left IJ 01/11/18), but needs permanent HD access.  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  - ED visit 10/30/18 for chest pain, somewhat atyipcal. Troponin 0.33, possibly trending down 1.13 on 10/28/18. T wave inversions in lateral leads were more pronounced. Given this was his third cardiology consult since 10/02/18, cardiologist Kirk Ruths, MD recommended definitive evaluation with cardiac cath, however, patient "does not want to proceed with cath and understands the risk.  He feels as though nothing has changed since May 2019 at time of his previous procedure.  He would like only medical therapy.  I also recommended admission for cycling of enzymes but he declined and understands the risk of death.  Would continue aspirin, Plavix, beta-blocker and statin.  Continue amlodipine as antianginal.  Add isosorbide 30 mg daily."  - Admission 10/27/18-10/29/18 for weakness and SOB in setting of peritoneal dialysis. K 2.8, Ca 5.6, troponin peak 1.13, felt demand ischemia in the setting of renal  insufficieny; however, out-patient stress test recommended in ~ 8 weeks. Hemodialysis restarted given ineffective results with PD and patient feeling poorly while on PD.   - Admission 10/01/18-10/05/18 for peritoneal abscess (versus seroma) in setting of PD catheter placement 09/13/18 and on-going hemodialysis. IR placed drain which was removed 10/05/18. Cultures negative and fluid collections thought consistent with old hematomas. Cardiology consulted for chest pain with troponin 0.22. Continued medication therapy recommended. Transfused with 1 unit PRBC.   Last seen by cardiology on 11/08/18 by Leanor Kail, [place] following 10/30/18 ED visit for chest pain in which LHC was declined. Patient reported chest pain improved on Imdur but "never completely resolved. Still has intermittent pain.  He has feeling of something stuck at epigastric area when he lays down.  Denies dyspnea, orthopnea, PND or lower extremity edema.  His volume is managed by dialysis.  He thinks his symptoms could be due to acid reflux but denies burning sensation." Imdur increased to 60 mg daily with two month follow-up planned.  He denied chest pain to PAT phone RN. Reported last Plavix was 12/04/18. Discussed with anesthesiologist Roberts Gaudy, MD. Patient undergoing HD 3x/week. Imdur increased at cardiology visit last month. Further evaluation on the day of surgery, but if no acute/progressive symptoms and labs acceptable then anticipate that patient can proceed as planned. Definitive plan following assigned anesthesiologist evaluation.     PROVIDERS: PCP is Nolene Ebbs, MD Cardiologist is Dorris Carnes, MD EP cardiologist is Allegra Lai, MD Nephrologist is with [facility]. He was not felt to be a renal transplant  candidate at [facility] as of 10/06/18.   LABS: He will need updated labs prior to surgery. As of 10/30/18, H/H 8.2/29.2, PLT 200, K 3.9, Cr 7.83, glucose 242, troponin 0.33 (down from 1.13 on 10/28/18). A1c 6.9 on  08/20/18.   IMAGES: CXR 10/30/18: FINDINGS: - Cardiac silhouette is mildly enlarged. Stable changes from previous cardiac surgery. No mediastinal or hilar masses. No evidence of adenopathy. - Left internal jugular dual-lumen central venous catheter is stable. - Lungs are clear.  No pleural effusion or pneumothorax. - Skeletal structures are intact. IMPRESSION: No acute cardiopulmonary disease.   EKG: 10/30/18: Sinus rhythm Right axis deviation Repol abnrm suggests ischemia, anterolateral Confirmed by Orpah Greek [phone]) on 10/30/2018 6:11:04 AM Also confirmed by Orpah Greek [phone]), editor Philomena Doheny [phone]) on 10/30/2018 7:24:40 AM   CV: Carotid US 08/20/18: Summary: - Right Carotid: Velocities in the right ICA are consistent with a 1-39% stenosis. - Left Carotid: Velocities in the left ICA are consistent with a 1-39% stenosis. - Vertebrals: Bilateral vertebral arteries demonstrate antegrade flow.  Echo 08/20/18: Study Conclusions - Left ventricle: The cavity size was normal. Wall thickness was   increased in a pattern of mild LVH. Systolic function was normal.   The estimated ejection fraction was in the range of 50% to 55%.   Wall motion was normal; there were no regional wall motion   abnormalities. Doppler parameters are consistent with abnormal   left ventricular relaxation (grade 1 diastolic dysfunction).   Doppler parameters are consistent with high ventricular filling   pressure. - Aortic valve: There was mild stenosis. There was trivial   regurgitation. - Mitral valve: Severely calcified annulus. Impressions: - Normal LV systolic function; mild LVH: mild diastolic   dysfunction; calcified aortic valve with mild AS (mean gradient   11 mmHg) and trace AI.  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  Total occlusion of the proximal to mid LAD filling late by left to left and  right to left collaterals.First diagonal contains 50% proximal narrowing.  LM trunk is normal.  CX gives origin to 3 OM branches. OM1 contains 50% proximal narrowing.  RCA is dominant giving PDA and left ventricular branch. Diffuse calcified 60% stenosis is noted throughout the mid vessel ending in the distal segment. No high-grade focal obstruction is seen.  Left ventriculogram and LV pressures were not recorded. - RECOMMENDATIONS:  Angina is related to total occlusion of the proximal to mid LAD. LIMA to the LAD is atretic. Because of the anomalous origin of the LM from the right sinus of Valsalva, CTO intervention would be very difficult and therefore he is not an ideal candidate until medical therapy efforts are exhausted.   Past Medical History:  Diagnosis Date  . Arthritis   . Atrial flutter ([place])    Ablation 2018  . 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 Erectile dysfunction   . ESRD (end stage renal disease) on dialysis ([place])    Pt on HD 2003 >> transplanted in 2009, back on HD in 2016. [name] [facility] TTS.   . Essential hypertension   . Gout   . Hernia of abdominal wall   . History of blood transfusion   . History of cardiomyopathy   . History  of kidney stones   . History of pneumonia   . Migraine   . Myocardial infarction ([place])    1996  . Pneumonia   . Secondary hyperparathyroidism ([place])   . Type 2 diabetes mellitus ([place])   . Wears glasses     Past Surgical History:  Procedure Laterality Date  . A-FLUTTER ABLATION N/A 09/24/2016   Procedure: A-Flutter Ablation;  Surgeon: Will Meredith Leeds, MD;  Location: [place] CV [facility];  Service: Cardiovascular;  Laterality: N/A;  . ABDOMINAL AORTOGRAM W/LOWER EXTREMITY N/A 04/13/2017   Procedure: ABDOMINAL AORTOGRAM W/LOWER EXTREMITY;  Surgeon: Conrad Winston, MD;  Location: [place] CV LAB;  Service: Cardiovascular;  Laterality: N/A;  Bilater lower extermity  . APPENDECTOMY    . AV FISTULA PLACEMENT Right 09/18/2014   Procedure: INSERTION OF ARTERIOVENOUS (AV) GORE-TEX GRAFT ARM USING 4-7MM  X 45CM STRETCH GORE-TEX VASCULAR GRAFT;  Surgeon: Rosetta Posner, MD;  Location: [place];  Service: Vascular;  Laterality: Right;  . AV FISTULA PLACEMENT Left 07/07/2016   Procedure: INSERTION OF LEFT BRACHIAL TO AXILLARY ARTERIOVENOUS (AV) GORE-TEX ARM GRAFT;  Surgeon: Conrad Hudson, MD;  Location: [place];  Service: Vascular;  Laterality: Left;  . CARDIAC CATHETERIZATION  ~ 2016  . COLONOSCOPY    . 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 EXCHANGE OF A DIALYSIS CATHETER Left 01/11/2018   Procedure: EXCHANGE OF TUNNELED DIALYSIS CATHETER;  Surgeon: Rosetta Posner, MD;  Location: [place];  Service: Vascular;  Laterality: Left;  . HERNIA REPAIR  2017   with nephrectomy  . INSERTION OF DIALYSIS CATHETER N/A 10/08/2017   Procedure: INSERTION OF TUNNELED DIALYSIS CATHETER;  Surgeon: Conrad Nicholas, MD;  Location: [place];  Service: Vascular;  Laterality: N/A;  . IR FLUORO GUIDE CV LINE LEFT  03/12/2018  . IR GENERIC HISTORICAL  05/11/2016   IR FLUORO GUIDE CV LINE LEFT 05/11/2016 Corrie Mckusick, DO [facility]-INTERV RAD  . IR GENERIC HISTORICAL  05/11/2016   IR US GUIDE VASC ACCESS LEFT 05/11/2016 Corrie Mckusick, DO [facility]-INTER[facility]  . IR GENERIC HISTORICAL  05/11/2016   IR US GUIDE VASC ACCESS RIGHT 05/11/2016 Corrie Mckusick, DO [facility]-INTER[facility]  . IR GENERIC HISTORICAL  05/11/2016   IR RADIOLOGY PERIPHERAL GUIDED IV START 05/11/2016 Corrie Mckusick, DO [facility]-INTER[facility]  . KIDNEY TRANSPLANT  2009  . LEFT HEART CATH AND CORS/GRAFTS ANGIOGRAPHY N/A 12/04/2017   Procedure: LEFT HEART CATH AND CORS/GRAFTS ANGIOGRAPHY;  Surgeon: Belva Crome, MD;  Location: [place] CV LAB;  Service: Cardiovascular;  Laterality:  N/A;  . NEPHRECTOMY  2017   transplant rejected   . PERIPHERAL VASCULAR CATHETERIZATION N/A 06/04/2016   Procedure: Upper Extremity Venography;  Surgeon: Waynetta Sandy, MD;  Location: [place] [facility];  Service: Cardiovascular;  Laterality: N/A;  . PERIPHERAL VASCULAR INTERVENTION  04/13/2017   Procedure: PERIPHERAL VASCULAR INTERVENTION;  Surgeon: Conrad [place], MD;  Location: [place] [facility];  Service: Cardiovascular;;  Lt. Common/Exernal  Iliac  . THROMBECTOMY AND REVISION OF ARTERIOVENTOUS (AV) GORETEX  GRAFT Left 10/08/2017   Procedure: THROMBECTOMY of ARTERIOVENTOUS (AV) GORETEX  GRAFT LEFT UPPER ARM;  Surgeon: Conrad [place], MD;  Location: [place];  Service: Vascular;  Laterality: Left;  . THROMBECTOMY W/ EMBOLECTOMY Left 09/14/2017   Procedure: THROMBECTOMY ARTERIOVENOUS GORE-TEX GRAFT LEFT UPPER ARM;  Surgeon: Rosetta Posner, MD;  Location: [place];  Service: Vascular;  Laterality:  Left;  . UPPER EXTREMITY ANGIOGRAPHY Bilateral 10/29/2018   Procedure: UPPER EXTREMITY ANGIOGRAPHY;  Surgeon: Marty Heck, MD;  Location: [place] [facility];  Service: Cardiovascular;  Laterality: Bilateral;  . UPPER EXTREMITY VENOGRAPHY N/A 11/16/2017   Procedure: UPPER EXTREMITY VENOGRAPHY - Right Arm;  Surgeon: Conrad Chatfield, MD;  Location: [place] [facility];  Service: Cardiovascular;  Laterality: N/A;  . UPPER EXTREMITY VENOGRAPHY Bilateral 10/29/2018   Procedure: UPPER EXTREMITY VENOGRAPHY;  Surgeon: Marty Heck, MD;  Location: [place] [facility];  Service: Cardiovascular;  Laterality: Bilateral;    MEDICATIONS: No current facility-administered medications for this encounter.    Marland Kitchen acetaminophen (TYLENOL) 500 MG tablet  . albuterol (PROVENTIL HFA;VENTOLIN HFA) 108 (90 Base) MCG/ACT inhaler  . amLODipine (NORVASC) 5 MG tablet  . aspirin EC 81 MG tablet  . bisoprolol (ZEBETA) 5 MG tablet  . calcium acetate (PHOSLO) 667 MG capsule  . Calcium Carbonate-Vitamin D (CALCIUM 500/D PO)   . clopidogrel (PLAVIX) 75 MG tablet  . Colchicine 0.6 MG CAPS  . dextromethorphan (DELSYM) 30 MG/5ML liquid  . docusate sodium (COLACE) 100 MG capsule  . ezetimibe (ZETIA) 10 MG tablet  . guaiFENesin (MUCINEX) 600 MG 12 hr tablet  . isosorbide mononitrate (IMDUR) 60 MG 24 hr tablet  . multivitamin (RENA-VIT) TABS tablet  . pantoprazole (PROTONIX) 40 MG tablet  . repaglinide (PRANDIN) 0.5 MG tablet  . rosuvastatin (CRESTOR) 10 MG tablet    Myra Gianotti, PA-C Surgical Short Stay/Anesthesiology [facility] Phone [phone] [facility] Phone [phone] 12/08/2018 3:50 PM

## 2018-12-08 NOTE — Progress Notes (Signed)
Pt denies SOB and chest pain. Pt stated that he is under the care of Dr. [name], Cardiology. Pt stated that a COVID-19 test was performed today. Pt stated that last dose of Plavix was Saturday as instructed. Pt made aware to stop taking vitamins, fish oil and herbal medications. Do not take any NSAIDs ie: Ibuprofen, Advil, Naproxen (Aleve), Motrin, BC and Goody Powder. Pt made aware to not take Prandin on DOS. Pt made aware to check BG every 2 hours prior to arrival to hospital on DOS. Pt made aware to treat a BG < 70 with 4 ounces of apple or cranberry juice, wait 15 minutes after intervention to recheck BG, if BG remains < 70, call Short Stay unit to speak with a nurse. Pt stated that he will not check his BG the morning of surgery. Pt denies that spouse tested positive for COVID-19.   Coronavirus Screening  Pt denies that he and spouse have experienced the following symptoms:  Cough yes/no: No ( no new onset ) Fever (>100.28F)  yes/no: No Runny nose yes/no: No Sore throat yes/no: No Difficulty breathing/shortness of breath  yes/no: No  Have you or a family member traveled in the last 14 days and where? yes/no: No  Pt reminded that hospital visitation restrictions are in effect and the importance of the restrictions.  Pt verbalized understanding of all pre-op instructions. PA,  Anesthesiology, asked to review pt recent cardiac note.

## 2018-12-09 ENCOUNTER — Ambulatory Visit (HOSPITAL_COMMUNITY): Payer: Medicare Other | Admitting: Certified Registered Nurse Anesthetist

## 2018-12-09 ENCOUNTER — Ambulatory Visit (HOSPITAL_COMMUNITY)
Admission: RE | Admit: 2018-12-09 | Discharge: 2018-12-09 | Disposition: A | Discharge status: Home / Self Care | Payer: Medicare Other | Attending: Vascular Surgery | Admitting: Vascular Surgery

## 2018-12-09 ENCOUNTER — Encounter (HOSPITAL_COMMUNITY): Payer: Self-pay

## 2018-12-09 ENCOUNTER — Encounter (HOSPITAL_COMMUNITY)
Admission: RE | Disposition: A | Discharge status: Home / Self Care | Payer: Self-pay | Source: Home / Self Care | Attending: Vascular Surgery

## 2018-12-09 ENCOUNTER — Other Ambulatory Visit: Payer: Self-pay

## 2018-12-09 DIAGNOSIS — Z87891 Personal history of nicotine dependence: Secondary | ICD-10-CM | POA: Diagnosis not present

## 2018-12-09 DIAGNOSIS — Z992 Dependence on renal dialysis: Secondary | ICD-10-CM | POA: Diagnosis not present

## 2018-12-09 DIAGNOSIS — Z7982 Long term (current) use of aspirin: Secondary | ICD-10-CM | POA: Diagnosis not present

## 2018-12-09 DIAGNOSIS — I252 Old myocardial infarction: Secondary | ICD-10-CM | POA: Diagnosis not present

## 2018-12-09 DIAGNOSIS — I739 Peripheral vascular disease, unspecified: Secondary | ICD-10-CM | POA: Insufficient documentation

## 2018-12-09 DIAGNOSIS — I2510 Atherosclerotic heart disease of native coronary artery without angina pectoris: Secondary | ICD-10-CM | POA: Insufficient documentation

## 2018-12-09 DIAGNOSIS — I5033 Acute on chronic diastolic (congestive) heart failure: Secondary | ICD-10-CM | POA: Diagnosis not present

## 2018-12-09 DIAGNOSIS — I120 Hypertensive chronic kidney disease with stage 5 chronic kidney disease or end stage renal disease: Secondary | ICD-10-CM | POA: Diagnosis not present

## 2018-12-09 DIAGNOSIS — N2581 Secondary hyperparathyroidism of renal origin: Secondary | ICD-10-CM | POA: Insufficient documentation

## 2018-12-09 DIAGNOSIS — Z951 Presence of aortocoronary bypass graft: Secondary | ICD-10-CM | POA: Diagnosis not present

## 2018-12-09 DIAGNOSIS — N186 End stage renal disease: Secondary | ICD-10-CM | POA: Diagnosis not present

## 2018-12-09 DIAGNOSIS — Z7984 Long term (current) use of oral hypoglycemic drugs: Secondary | ICD-10-CM | POA: Diagnosis not present

## 2018-12-09 DIAGNOSIS — Z79899 Other long term (current) drug therapy: Secondary | ICD-10-CM | POA: Diagnosis not present

## 2018-12-09 DIAGNOSIS — Z7901 Long term (current) use of anticoagulants: Secondary | ICD-10-CM | POA: Diagnosis not present

## 2018-12-09 DIAGNOSIS — N185 Chronic kidney disease, stage 5: Secondary | ICD-10-CM | POA: Diagnosis not present

## 2018-12-09 DIAGNOSIS — I132 Hypertensive heart and chronic kidney disease with heart failure and with stage 5 chronic kidney disease, or end stage renal disease: Secondary | ICD-10-CM | POA: Diagnosis not present

## 2018-12-09 DIAGNOSIS — I4892 Unspecified atrial flutter: Secondary | ICD-10-CM | POA: Diagnosis not present

## 2018-12-09 DIAGNOSIS — M1990 Unspecified osteoarthritis, unspecified site: Secondary | ICD-10-CM | POA: Diagnosis not present

## 2018-12-09 HISTORY — PX: AV FISTULA PLACEMENT: SHX1204

## 2018-12-09 LAB — POCT I-STAT 4, (NA,K, GLUC, HGB,HCT)
Glucose, Bld: 123 mg/dL — ABNORMAL HIGH (ref 70–99)
HCT: 35 % — ABNORMAL LOW (ref 39.0–52.0)
Hemoglobin: 11.9 g/dL — ABNORMAL LOW (ref 13.0–17.0)
Potassium: 4.8 mmol/L (ref 3.5–5.1)
Sodium: 139 mmol/L (ref 135–145)

## 2018-12-09 LAB — GLUCOSE, CAPILLARY
Glucose-Capillary: 99 mg/dL (ref 70–99)
Glucose-Capillary: 115 mg/dL — ABNORMAL HIGH (ref 70–99)

## 2018-12-09 SURGERY — INSERTION OF ARTERIOVENOUS (AV) GORE-TEX GRAFT ARM
Anesthesia: Monitor Anesthesia Care | Site: Groin | Laterality: Left

## 2018-12-09 MED ORDER — PHENYLEPHRINE HCL-NACL 10-0.9 MG/250ML-% IV SOLN
0.0000 ug/min | INTRAVENOUS | Status: DC
  Administered 2018-12-09: 20 ug/min via INTRAVENOUS
  Filled 2018-12-09: qty 250

## 2018-12-09 MED ORDER — SODIUM CHLORIDE 0.9 % IV SOLN
INTRAVENOUS | Status: DC | PRN
  Administered 2018-12-09: 07:00:00

## 2018-12-09 MED ORDER — PROPOFOL 10 MG/ML IV BOLUS
INTRAVENOUS | Status: AC
  Filled 2018-12-09: qty 20

## 2018-12-09 MED ORDER — HEPARIN SODIUM (PORCINE) 1000 UNIT/ML IJ SOLN
INTRAMUSCULAR | Status: AC
  Filled 2018-12-09: qty 1

## 2018-12-09 MED ORDER — MIDAZOLAM HCL 2 MG/2ML IJ SOLN
INTRAMUSCULAR | Status: AC
  Filled 2018-12-09: qty 2

## 2018-12-09 MED ORDER — ONDANSETRON HCL 4 MG/2ML IJ SOLN
INTRAMUSCULAR | Status: DC | PRN
  Administered 2018-12-09: 4 mg via INTRAVENOUS

## 2018-12-09 MED ORDER — FENTANYL CITRATE (PF) 100 MCG/2ML IJ SOLN
INTRAMUSCULAR | Status: DC | PRN
  Administered 2018-12-09 (×3): 25 ug via INTRAVENOUS

## 2018-12-09 MED ORDER — CEFAZOLIN SODIUM-DEXTROSE 2-4 GM/100ML-% IV SOLN
2.0000 g | INTRAVENOUS | Status: AC
  Administered 2018-12-09: 08:00:00 2 g via INTRAVENOUS
  Filled 2018-12-09: qty 100

## 2018-12-09 MED ORDER — LIDOCAINE 2% (20 MG/ML) 5 ML SYRINGE
INTRAMUSCULAR | Status: DC | PRN
  Administered 2018-12-09: 60 mg via INTRAVENOUS

## 2018-12-09 MED ORDER — PHENYLEPHRINE 40 MCG/ML (10ML) SYRINGE FOR IV PUSH (FOR BLOOD PRESSURE SUPPORT)
PREFILLED_SYRINGE | INTRAVENOUS | Status: AC
  Filled 2018-12-09: qty 10

## 2018-12-09 MED ORDER — PROPOFOL 1000 MG/100ML IV EMUL
INTRAVENOUS | Status: AC
  Filled 2018-12-09: qty 400

## 2018-12-09 MED ORDER — LIDOCAINE 2% (20 MG/ML) 5 ML SYRINGE
INTRAMUSCULAR | Status: AC
  Filled 2018-12-09: qty 5

## 2018-12-09 MED ORDER — SODIUM CHLORIDE 0.9 % IV SOLN
INTRAVENOUS | Status: AC
  Filled 2018-12-09: qty 1.2

## 2018-12-09 MED ORDER — PHENYLEPHRINE 40 MCG/ML (10ML) SYRINGE FOR IV PUSH (FOR BLOOD PRESSURE SUPPORT)
PREFILLED_SYRINGE | INTRAVENOUS | Status: DC | PRN
  Administered 2018-12-09: 80 ug via INTRAVENOUS

## 2018-12-09 MED ORDER — PROPOFOL 500 MG/50ML IV EMUL
INTRAVENOUS | Status: DC | PRN
  Administered 2018-12-09: 100 ug/kg/min via INTRAVENOUS

## 2018-12-09 MED ORDER — HEPARIN SODIUM (PORCINE) 1000 UNIT/ML IJ SOLN
INTRAMUSCULAR | Status: DC | PRN
  Administered 2018-12-09: 3000 [IU] via INTRAVENOUS

## 2018-12-09 MED ORDER — HEMOSTATIC AGENTS (NO CHARGE) OPTIME
TOPICAL | Status: DC | PRN
  Administered 2018-12-09: 1 via TOPICAL

## 2018-12-09 MED ORDER — SODIUM CHLORIDE 0.9 % IV SOLN
INTRAVENOUS | Status: DC | PRN
  Administered 2018-12-09: 08:00:00 25 ug/min via INTRAVENOUS

## 2018-12-09 MED ORDER — LIDOCAINE HCL (PF) 1 % IJ SOLN
INTRAMUSCULAR | Status: AC
  Filled 2018-12-09: qty 30

## 2018-12-09 MED ORDER — 0.9 % SODIUM CHLORIDE (POUR BTL) OPTIME
TOPICAL | Status: DC | PRN
  Administered 2018-12-09: 1000 mL

## 2018-12-09 MED ORDER — FENTANYL CITRATE (PF) 250 MCG/5ML IJ SOLN
INTRAMUSCULAR | Status: AC
  Filled 2018-12-09: qty 5

## 2018-12-09 MED ORDER — ONDANSETRON HCL 4 MG/2ML IJ SOLN
INTRAMUSCULAR | Status: AC
  Filled 2018-12-09: qty 2

## 2018-12-09 MED ORDER — SODIUM CHLORIDE 0.9 % IV SOLN
INTRAVENOUS | Status: DC
  Administered 2018-12-09: 08:00:00 via INTRAVENOUS

## 2018-12-09 MED ORDER — OXYCODONE-ACETAMINOPHEN 5-325 MG PO TABS: 1.0000 | ORAL_TABLET | Freq: Four times a day (QID) | ORAL | 0 refills | Status: DC | PRN

## 2018-12-09 MED ORDER — PHENYLEPHRINE HCL-NACL 10-0.9 MG/250ML-% IV SOLN
INTRAVENOUS | Status: AC
  Filled 2018-12-09: qty 750

## 2018-12-09 MED ORDER — LIDOCAINE HCL 1 % IJ SOLN
INTRAMUSCULAR | Status: DC | PRN
  Administered 2018-12-09: 15 mL

## 2018-12-09 SURGICAL SUPPLY — 66 items
ARMBAND PINK RESTRICT EXTREMIT (MISCELLANEOUS) ×10 IMPLANT
BAG DECANTER FOR FLEXI CONT (MISCELLANEOUS) ×5 IMPLANT
BIOPATCH RED 1 DISK 7.0 (GAUZE/BANDAGES/DRESSINGS) IMPLANT
BIOPATCH RED 1IN DISK 7.0MM (GAUZE/BANDAGES/DRESSINGS)
CANISTER SUCT 3000ML PPV (MISCELLANEOUS) ×5 IMPLANT
CATH PALINDROME RT-P 15FX19CM (CATHETERS) IMPLANT
CATH PALINDROME RT-P 15FX23CM (CATHETERS) IMPLANT
CATH PALINDROME RT-P 15FX28CM (CATHETERS) IMPLANT
CATH PALINDROME RT-P 15FX55CM (CATHETERS) IMPLANT
CATH STRAIGHT 5FR 65CM (CATHETERS) IMPLANT
CLIP VESOCCLUDE MED 6/CT (CLIP) ×5 IMPLANT
CLIP VESOCCLUDE SM WIDE 6/CT (CLIP) ×5 IMPLANT
COVER PROBE W GEL 5X96 (DRAPES) ×10 IMPLANT
COVER SURGICAL LIGHT HANDLE (MISCELLANEOUS) ×5 IMPLANT
COVER WAND RF STERILE (DRAPES) ×5 IMPLANT
DECANTER SPIKE VIAL GLASS SM (MISCELLANEOUS) ×5 IMPLANT
DERMABOND ADVANCED (GAUZE/BANDAGES/DRESSINGS) ×2
DERMABOND ADVANCED .7 DNX12 (GAUZE/BANDAGES/DRESSINGS) ×3 IMPLANT
DRAPE C-ARM 42X72 X-RAY (DRAPES) ×5 IMPLANT
DRAPE CHEST BREAST 15X10 FENES (DRAPES) ×5 IMPLANT
DRAPE HALF SHEET 40X57 (DRAPES) ×4 IMPLANT
ELECT REM PT RETURN 9FT ADLT (ELECTROSURGICAL) ×5
ELECTRODE REM PT RTRN 9FT ADLT (ELECTROSURGICAL) ×3 IMPLANT
GAUZE 4X4 16PLY RFD (DISPOSABLE) ×5 IMPLANT
GLOVE BIO SURGEON STRL SZ 6.5 (GLOVE) ×4 IMPLANT
GLOVE BIO SURGEON STRL SZ7 (GLOVE) ×5 IMPLANT
GLOVE BIO SURGEON STRL SZ7.5 (GLOVE) ×5 IMPLANT
GLOVE BIO SURGEONS STRL SZ 6.5 (GLOVE) ×1
GLOVE BIOGEL PI IND STRL 6.5 (GLOVE) ×3 IMPLANT
GLOVE BIOGEL PI IND STRL 7.0 (GLOVE) ×3 IMPLANT
GLOVE BIOGEL PI IND STRL 8 (GLOVE) ×3 IMPLANT
GLOVE BIOGEL PI INDICATOR 6.5 (GLOVE) ×2
GLOVE BIOGEL PI INDICATOR 7.0 (GLOVE) ×2
GLOVE BIOGEL PI INDICATOR 8 (GLOVE) ×2
GOWN STRL REUS W/ TWL LRG LVL3 (GOWN DISPOSABLE) ×6 IMPLANT
GOWN STRL REUS W/ TWL XL LVL3 (GOWN DISPOSABLE) ×6 IMPLANT
GOWN STRL REUS W/TWL LRG LVL3 (GOWN DISPOSABLE) ×4
GOWN STRL REUS W/TWL XL LVL3 (GOWN DISPOSABLE) ×4
GRAFT GORETEX STRT 4-7X45 (Vascular Products) ×5 IMPLANT
HEMOSTAT SPONGE AVITENE ULTRA (HEMOSTASIS) IMPLANT
KIT BASIN OR (CUSTOM PROCEDURE TRAY) ×5 IMPLANT
KIT TURNOVER KIT B (KITS) ×5 IMPLANT
NEEDLE 18GX1X1/2 (RX/OR ONLY) (NEEDLE) ×5 IMPLANT
NEEDLE HYPO 25GX1X1/2 BEV (NEEDLE) ×5 IMPLANT
NS IRRIG 1000ML POUR BTL (IV SOLUTION) ×5 IMPLANT
PACK CV ACCESS (CUSTOM PROCEDURE TRAY) ×5 IMPLANT
PACK SURGICAL SETUP 50X90 (CUSTOM PROCEDURE TRAY) ×5 IMPLANT
PAD ARMBOARD 7.5X6 YLW CONV (MISCELLANEOUS) ×10 IMPLANT
SET MICROPUNCTURE 5F STIFF (MISCELLANEOUS) IMPLANT
SOAP 2 % CHG 4 OZ (WOUND CARE) IMPLANT
SURGICEL SNOW 2X4 (HEMOSTASIS) ×5 IMPLANT
SUT ETHILON 3 0 PS 1 (SUTURE) ×5 IMPLANT
SUT MNCRL AB 4-0 PS2 18 (SUTURE) ×5 IMPLANT
SUT PROLENE 6 0 BV (SUTURE) ×15 IMPLANT
SUT PROLENE 7 0 BV 1 (SUTURE) IMPLANT
SUT VIC AB 3-0 SH 27 (SUTURE) ×2
SUT VIC AB 3-0 SH 27X BRD (SUTURE) ×3 IMPLANT
SYR 10ML LL (SYRINGE) ×5 IMPLANT
SYR 20CC LL (SYRINGE) ×10 IMPLANT
SYR 5ML LL (SYRINGE) ×5 IMPLANT
SYR CONTROL 10ML LL (SYRINGE) ×5 IMPLANT
TOWEL GREEN STERILE (TOWEL DISPOSABLE) ×5 IMPLANT
TOWEL GREEN STERILE FF (TOWEL DISPOSABLE) ×5 IMPLANT
UNDERPAD 30X30 (UNDERPADS AND DIAPERS) ×5 IMPLANT
WATER STERILE IRR 1000ML POUR (IV SOLUTION) ×5 IMPLANT
WIRE AMPLATZ SS-J .035X180CM (WIRE) IMPLANT

## 2018-12-09 NOTE — Anesthesia Procedure Notes (Signed)
Procedure Name: MAC Date/Time: 12/09/2018 7:35 AM Performed by: Alain Marion, CRNA Pre-anesthesia Checklist: Patient identified, Emergency Drugs available, Suction available, Patient being monitored and Timeout performed Oxygen Delivery Method: Simple face mask Placement Confirmation: positive ETCO2

## 2018-12-09 NOTE — Transfer of Care (Signed)
Immediate Anesthesia Transfer of Care Note  Patient: Patrick [name]  Procedure(s) Performed: Insertion Of Arteriovenous (Av) Gore-Tex Graft Arm, left arm (Left Arm Upper)  Patient Location: PACU  Anesthesia Type:MAC  Level of Consciousness: awake, alert  and oriented  Airway & Oxygen Therapy: Patient Spontanous Breathing and Patient connected to face mask oxygen  Post-op Assessment: Report given to RN and Post -op Vital signs reviewed and stable  Post vital signs: Reviewed and stable  Last Vitals:  Vitals Value Taken Time  BP 95/46 12/09/2018  9:30 AM  Temp 36.3 C 12/09/2018  9:30 AM  Pulse 56 12/09/2018  9:31 AM  Resp 19 12/09/2018  9:31 AM  SpO2 100 % 12/09/2018  9:31 AM  Vitals shown include unvalidated device data.  Last Pain:  Vitals:   12/09/18 0604  TempSrc:   PainSc: 0-No pain         Complications: No apparent anesthesia complications

## 2018-12-09 NOTE — Discharge Instructions (Signed)
° °  [facility] ° °Discharge Instructions ° °AV Fistula or Graft Surgery for Dialysis Access ° °Please refer to the following instructions for your post-procedure care. Your surgeon or physician assistant will discuss any changes with you. ° °Activity ° °You may drive the day following your surgery, if you are comfortable and no longer taking prescription pain medication. Resume full activity as the soreness in your incision resolves. ° °Bathing/Showering ° °You may shower after you go home. Keep your incision dry for 48 hours. Do not soak in a bathtub, hot tub, or swim until the incision heals completely. You may not shower if you have a hemodialysis catheter. ° °Incision Care ° °Clean your incision with mild soap and water after 48 hours. Pat the area dry with a clean towel. You do not need a bandage unless otherwise instructed. Do not apply any ointments or creams to your incision. You may have skin glue on your incision. Do not peel it off. It will come off on its own in about one week. Your arm may swell a bit after surgery. To reduce swelling use pillows to elevate your arm so it is above your heart. Your doctor will tell you if you need to lightly wrap your arm with an ACE bandage. ° °Diet ° °Resume your normal diet. There are not special food restrictions following this procedure. In order to heal from your surgery, it is CRITICAL to get adequate nutrition. Your body requires vitamins, minerals, and protein. Vegetables are the best source of vitamins and minerals. Vegetables also provide the perfect balance of protein. Processed food has little nutritional value, so try to avoid this. ° °Medications ° °Resume taking all of your medications. If your incision is causing pain, you may take over-the counter pain relievers such as acetaminophen (Tylenol). If you were prescribed a stronger pain medication, please be aware these medications can cause nausea and constipation. Prevent  nausea by taking the medication with a snack or meal. Avoid constipation by drinking plenty of fluids and eating foods with high amount of fiber, such as fruits, vegetables, and grains. Do not take Tylenol if you are taking prescription pain medications. ° ° ° ° °Follow up °Your surgeon may want to see you in the office following your access surgery. If so, this will be arranged at the time of your surgery. ° °Please call us immediately for any of the following conditions: ° °Increased pain, redness, drainage (pus) from your incision site °Fever of 101 degrees or higher °Severe or worsening pain at your incision site °Hand pain or numbness. ° °Reduce your risk of vascular disease: ° °Stop smoking. If you would like help, call [facility] at 1-800-QUIT-NOW (1-800-784-8669) or [place] at [phone] ° °Manage your cholesterol °Maintain a desired weight °Control your diabetes °Keep your blood pressure down ° °Dialysis ° °It will take several weeks to several months for your new dialysis access to be ready for use. Your surgeon will determine when it is OK to use it. Your nephrologist will continue to direct your dialysis. You can continue to use your Permcath until your new access is ready for use. ° °If you have any questions, please call the office at [phone]. ° °

## 2018-12-09 NOTE — H&P (Signed)
History and Physical Interval Note:  12/09/2018 7:22 AM  Varney Daily  has presented today for surgery, with the diagnosis of END-STAGE RENAL DISEASE.  The various methods of treatment have been discussed with the patient and family. After consideration of risks, benefits and other options for treatment, the patient has consented to  Procedure(s): ARTERIOVENOUS (AV) GRAFT CREATION (Left) INSERTION OF DIALYSIS CATHETER (Left) as a surgical intervention.  The patient's history has been reviewed, patient examined, no change in status, stable for surgery.  I have reviewed the patient's chart and labs.  Questions were answered to the patient's satisfaction.    Left arm AVG.  Previous failed access in left arm and understands high risk for failure.  Does not want thigh access at this time.   [place] CONSULT NOTE   MRN : [id number]  Reason for Consult: Needs permanent HD access Referring Physician: Dr. 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  Most recently he was on PD at home and had recent admission for seroma vs absccess 3/6-3/10 IR drained fluid.Culture was negative - he was discharged on Augmentin.               He has had multiple UE access prior procedures:working TDC exchange by Dr. Donnetta Hutching 01/11/2018.    1. Failed TE LUA AVG (10/08/17), LIJV TDC placement 2. TE LUA AVG (09/14/17) 3. PTA+S L CIA and EIA (04/13/17) 4. LUA AVG (07/07/16) 5. RUA AVG (09/18/14)              He has now decided he wants to return to HD and we have been consulted for HD access.               Past medical history includes: diabetes mellitus, coronary artery disease status post coronary artery bypass and graft, end-stage renal disease on peritoneal dialysis, hypertension,peripheral vascular disease, atrial flutter status post ablation.                              Current Facility-Administered Medications  Medication Dose Route Frequency Provider Last Rate Last Dose  . acetaminophen (TYLENOL) tablet 1,000 mg  1,000 mg Oral Q6H PRN Lady Deutscher, MD      . albuterol (PROVENTIL HFA;VENTOLIN HFA) 108 (90 Base) MCG/ACT inhaler 2 puff  2 puff Inhalation Q6H PRN Lady Deutscher, MD      . amLODipine (NORVASC) tablet 5 mg  5 mg Oral Daily Lady Deutscher, MD   5 mg at 10/28/18 0816  . aspirin EC tablet 81 mg  81 mg Oral Daily Lady Deutscher, MD   81 mg at 10/28/18 0817  . bisoprolol (ZEBETA) tablet 5 mg  5 mg Oral Daily Lady Deutscher, MD   5 mg at 10/28/18 0817  . calcium acetate (PHOSLO) capsule 1,334 mg  1,334 mg Oral TID WC Lady Deutscher, MD   1,334 mg at 10/28/18 1201  . Chlorhexidine Gluconate Cloth 2 % PADS 6 each  6 each Topical Q0600 Alric Seton, PA-C   6 each at 10/28/18 [phone]  . [START ON 10/29/2018] Chlorhexidine Gluconate Cloth 2 % PADS 6 each  6 each Topical Q0600 Roney Jaffe, MD      . clopidogrel (PLAVIX) tablet 75 mg  75 mg Oral Daily Lady Deutscher, MD  75 mg at 10/28/18 0816  . [START ON 11/06/2018] colchicine tablet 0.6 mg  0.6 mg Oral Q14 Days Lady Deutscher, MD      . docusate sodium (COLACE) capsule 100 mg  100 mg Oral Daily PRN Lady Deutscher, MD      . gentamicin cream (GARAMYCIN) 0.1 % 1 application  1 application Topical Daily PRN Lady Deutscher, MD      . heparin injection 1,000 Units  1,000 Units Intravenous Q T,Th,Sa-HD Elmarie Shiley, MD   4,200 Units at 10/27/18 2233  . heparin injection 5,000 Units  5,000 Units Subcutaneous Q8H Lady Deutscher, MD   5,000 Units at 10/28/18 [phone]  . isosorbide mononitrate (IMDUR) 24 hr tablet 120 mg  120 mg Oral Daily Lady Deutscher, MD   120 mg at 10/28/18 3435  . multivitamin (RENA-VIT) tablet 1 tablet  1 tablet Oral Daily Lady Deutscher, MD   1 tablet at 10/28/18 0817  . ondansetron (ZOFRAN) tablet 4 mg  4 mg Oral Q6H PRN  Lady Deutscher, MD       Or  . ondansetron [facility]) injection 4 mg  4 mg Intravenous Q6H PRN Lady Deutscher, MD      . polyethylene glycol (MIRALAX / GLYCOLAX) packet 17 g  17 g Oral Daily PRN Lady Deutscher, MD      . repaglinide (PRANDIN) tablet 0.5 mg  0.5 mg Oral BID AC Lady Deutscher, MD   0.5 mg at 10/28/18 0954  . rosuvastatin (CRESTOR) tablet 10 mg  10 mg Oral q1800 Lady Deutscher, MD      . sodium chloride flush (NS) 0.9 % injection 3 mL  3 mL Intravenous Q12H Lady Deutscher, MD   3 mL at 10/28/18 0820  . zolpidem (AMBIEN) tablet 5 mg  5 mg Oral QHS PRN,MR X 1 Lady Deutscher, MD        Pt meds include: Statin :Yes Betablocker: Yes ASA: Yes Other anticoagulants/antiplatelets: None      Past Medical History:  Diagnosis Date  . Arthritis   . Atrial flutter ([place])    Ablation 2018  . 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 Erectile dysfunction   . ESRD (end stage renal disease) on dialysis ([place])    Pt on HD 2003 >> transplanted in 2009, back on HD in 2016. [name] TTS.   . Essential hypertension   . Gout   . Hernia of abdominal wall   . History of blood transfusion   . History of cardiomyopathy   . History of kidney stones   . History of pneumonia   . Migraine   . Myocardial infarction ([place])    1996  . Secondary hyperparathyroidism ([place])   . Type 2 diabetes mellitus ([place])   . Wears glasses          Past Surgical History:  Procedure Laterality Date  . A-FLUTTER ABLATION N/A 09/24/2016   Procedure: A-Flutter Ablation;  Surgeon: Will Meredith Leeds, MD;  Location: [place] [facility];  Service: Cardiovascular;  Laterality: N/A;  . ABDOMINAL AORTOGRAM W/LOWER EXTREMITY N/A 04/13/2017   Procedure: ABDOMINAL AORTOGRAM W/LOWER EXTREMITY;  Surgeon:  Conrad [place], MD;  Location: [place] [facility];  Service: Cardiovascular;  Laterality: N/A;  Bilater lower extermity  . APPENDECTOMY    .  AV FISTULA PLACEMENT Right 09/18/2014   Procedure: INSERTION OF ARTERIOVENOUS (AV) GORE-TEX GRAFT ARM USING 4-7MM  X 45CM STRETCH GORE-TEX VASCULAR GRAFT;  Surgeon: Rosetta Posner, MD;  Location: [place];  Service: Vascular;  Laterality: Right;  . AV FISTULA PLACEMENT Left 07/07/2016   Procedure: INSERTION OF LEFT BRACHIAL TO AXILLARY ARTERIOVENOUS (AV) GORE-TEX ARM GRAFT;  Surgeon: Conrad [place], MD;  Location: [place];  Service: Vascular;  Laterality: Left;  . CARDIAC CATHETERIZATION  ~ 2016  . COLONOSCOPY    . 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 EXCHANGE OF A DIALYSIS CATHETER Left 01/11/2018   Procedure: EXCHANGE OF TUNNELED DIALYSIS CATHETER;  Surgeon: Rosetta Posner, MD;  Location: [place];  Service: Vascular;  Laterality: Left;  . HERNIA REPAIR  2017   with nephrectomy  . INSERTION OF DIALYSIS CATHETER N/A 10/08/2017   Procedure: INSERTION OF TUNNELED DIALYSIS CATHETER;  Surgeon: Conrad [place], MD;  Location: [place];  Service: Vascular;  Laterality: N/A;  . IR FLUORO GUIDE CV LINE LEFT  03/12/2018  . IR GENERIC HISTORICAL  05/11/2016   IR FLUORO GUIDE CV LINE LEFT 05/11/2016 Corrie Mckusick, DO [facility]  . IR GENERIC HISTORICAL  05/11/2016   IR US GUIDE VASC ACCESS LEFT 05/11/2016 Corrie Mckusick, DO [facility]  . IR GENERIC HISTORICAL  05/11/2016   IR US GUIDE VASC ACCESS RIGHT 05/11/2016 Corrie Mckusick, DO [facility]  . IR GENERIC HISTORICAL  05/11/2016   IR RADIOLOGY PERIPHERAL GUIDED IV START 05/11/2016 Corrie Mckusick, DO [facility]  . KIDNEY TRANSPLANT  2009  . LEFT HEART CATH AND CORS/GRAFTS ANGIOGRAPHY N/A 12/04/2017   Procedure: LEFT HEART CATH AND CORS/GRAFTS ANGIOGRAPHY;  Surgeon: Belva Crome, MD;  Location: [place] [facility];  Service:  Cardiovascular;  Laterality: N/A;  . NEPHRECTOMY  2017   transplant rejected   . PERIPHERAL VASCULAR CATHETERIZATION N/A 06/04/2016   Procedure: Upper Extremity Venography;  Surgeon: Waynetta Sandy, MD;  Location: [place] [facility];  Service: Cardiovascular;  Laterality: N/A;  . PERIPHERAL VASCULAR INTERVENTION  04/13/2017   Procedure: PERIPHERAL VASCULAR INTERVENTION;  Surgeon: Conrad [place], MD;  Location: [place] [facility];  Service: Cardiovascular;;  Lt. Common/Exernal  Iliac  . THROMBECTOMY AND REVISION OF ARTERIOVENTOUS (AV) GORETEX  GRAFT Left 10/08/2017   Procedure: THROMBECTOMY of ARTERIOVENTOUS (AV) GORETEX  GRAFT LEFT UPPER ARM;  Surgeon: Conrad [place], MD;  Location: [place];  Service: Vascular;  Laterality: Left;  . THROMBECTOMY W/ EMBOLECTOMY Left 09/14/2017   Procedure: THROMBECTOMY ARTERIOVENOUS GORE-TEX GRAFT LEFT UPPER ARM;  Surgeon: Rosetta Posner, MD;  Location: [place];  Service: Vascular;  Laterality: Left;  . UPPER EXTREMITY VENOGRAPHY N/A 11/16/2017   Procedure: UPPER EXTREMITY VENOGRAPHY - Right Arm;  Surgeon: Conrad [place], MD;  Location: [place] [facility];  Service: Cardiovascular;  Laterality: N/A;    Social History Social History        Tobacco Use  . Smoking status: Former Smoker    Types: Cigarettes    Last attempt to quit: 07/04/2014    Years since quitting: 4.3  . Smokeless tobacco: Never Used  . Tobacco comment: "smoked ~ 1 pack/month when I did smoke; never a steady smoker"  Substance Use Topics  . Alcohol use: Yes    Alcohol/week: 0.0 standard drinks    Comment: rare  "2 drinks, 1-2 times/year"  . Drug use: No  Family History      Family History  Problem Relation Age of Onset  . Hyperlipidemia Mother   . Hypertension Mother   . HIV Sister          Allergies  Allergen Reactions  . Iodinated Diagnostic Agents Swelling, Rash and Other (See Comments)    Other Reaction: burning to mouth, swelling of lips   . Lipitor [Atorvastatin] Other (See Comments)    Leg pain  . Metoprolol Other (See Comments)    Headaches   . Baclofen Other (See Comments)    Possibly stroke like symptoms     REVIEW OF SYSTEMS  General: [ ]  Weight loss, [ ]  Fever, [ ]  chills Neurologic: [ ]  Dizziness, [ ]  Blackouts, [ ]  Seizure [ ]  Stroke, [ ]  "Mini stroke", [ ]  Slurred speech, [ ]  Temporary blindness; [ ]  weakness in arms or legs, [ ]  Hoarseness [ ]  Dysphagia Cardiac: [ ]  Chest pain/pressure, [ ]  Shortness of breath at rest [ ]  Shortness of breath with exertion, [ ]  Atrial fibrillation or irregular heartbeat  Vascular: [ ]  Pain in legs with walking, [ ]  Pain in legs at rest, [ ]  Pain in legs at night,  [ ]  Non-healing ulcer, [ ]  Blood clot in vein/DVT,   Pulmonary: [ ]  Home oxygen, [ ]  Productive cough, [ ]  Coughing up blood, [ ]  Asthma,  [ ]  Wheezing [ ]  COPD Musculoskeletal:  [ ]  Arthritis, [ ]  Low back pain, [ ]  Joint pain Hematologic: [ ]  Easy Bruising, [ ]  Anemia; [ ]  Hepatitis Gastrointestinal: [ ]  Blood in stool, [ ]  Gastroesophageal Reflux/heartburn, Urinary: [ ]  chronic Kidney disease, [ ]  on HD - [ ]  MWF or [ ]  TTHS, [ ]  Burning with urination, [ ]  Difficulty urinating Skin: [ ]  Rashes, [ ]  Wounds Psychological: [ ]  Anxiety, [ ]  Depression  Physical Examination       Vitals:   10/28/18 0435 10/28/18 0538 10/28/18 0819 10/28/18 1133  BP: 135/83  111/76 115/75  Pulse: 90  89 78  Resp: 18   16  Temp: 98.9 F (37.2 C)  99.2 F (37.3 C) 99.7 F (37.6 C)  TempSrc: Oral  Oral Oral  SpO2: 100%   100%  Weight:  94.8 kg    Height:       Body mass index is 33.72 kg/m.  General:  WDWN in NAD HENT: WNL, normocephalic Eyes: Pupils equal Pulmonary: normal non-labored breathing , without Rales, rhonchi,  wheezing Cardiac: RRR, without  Murmurs, rubs or gallops; No carotid bruits Abdomen: soft, NT, no masses, PD cath in place Skin: no rashes, ulcers noted;  no Gangrene ,  no cellulitis; no open wounds;   Vascular Exam/Pulses:B UE warm with intact motor radial pulses not palpable, femoral and DP/PT pulses palpable B.   Musculoskeletal: no muscle wasting or atrophy Neurologic: A&O X 3; Appropriate Affect ;  SENSATION: normal; MOTOR FUNCTION: 5/5 Symmetric Speech is fluent/normal   Significant Diagnostic Studies: CBC RecentLabs       Lab Results  Component Value Date   WBC 4.8 10/28/2018   HGB 7.9 (L) 10/28/2018   HCT 26.2 (L) 10/28/2018   MCV 90.3 10/28/2018   PLT 160 10/28/2018      BMET Labs(Brief)     Component Value Date/Time   NA 134 (L) 10/28/2018 0808   NA 139 11/30/2017 1008   K 4.0 10/28/2018 0808   CL 97 (L) 10/28/2018 0808   CO2 24 10/28/2018 3419  GLUCOSE 108 (H) 10/28/2018 0808   BUN 22 10/28/2018 0808   BUN 48 (H) 11/30/2017 1008   CREATININE 11.65 (H) 10/28/2018 0808   CALCIUM 7.0 (L) 10/28/2018 0808   GFRNONAA 4 (L) 10/28/2018 0808   GFRAA 5 (L) 10/28/2018 0808     Estimated Creatinine Clearance: 6.9 mL/min (A) (by C-G formula based on SCr of 11.65 mg/dL (H)).  COAG RecentLabs       Lab Results  Component Value Date   INR 1.06 08/19/2018   INR 1.89 09/24/2016   INR 1.30 07/07/2016         ASSESSMENT/PLAN:  ESRD currently on HD via left [facility]  The patient wishes to explore possible left UE graft placement before he wants to have a thigh graft placed if possible since he has a long standing working left TDC.             His most recent left UE procedure was failed thrombectomy with previous stents. I explained even if we are able to place a left UE graft he will need a thigh TDC until the graft is ready for use.  If the left UE is not useful then we will plan a thigh loop graft.               Dr. Trula Slade  Will review previous studies and make recommendations for future access.               After discussion with Dr. Trula Slade we will proceed first with bilateral UE  venograms and possible bilateral UE arteriogram to best determine the next placement for access 10/29/2018.  NPO past MN.                Roxy Horseman 10/28/2018 1:35 PM  I agree with the above.  I have seen and evaluated the patient and agree with the PAs above note.  Briefly this is a 63 yo male with end-stage renal disease.  He has had multiple bilateral upper extremity grafts.  He would prefer not to have a graft in his leg.  From an access management perspective, I think he needs to undergo bilateral upper extremity venograms to evaluate his options.  He has a palpable left radial pulse and a nonpalpable right radial pulse.  If his best option is a right upper extremity graft, he would probably benefit from right upper extremity angiography prior to placing a right-sided graft.  Definitive access plans will be made based on the results of the studies.  He will be n.p.o. after midnight for bilateral upper extremity venograms and possible upper extremity angiography tomorrow.  Annamarie Major

## 2018-12-09 NOTE — Progress Notes (Signed)
[place] MD notified of systolic BP in the 90'B at 0956 am, Neomycin was started a 500 mL bolus of NS was given per verbal order. Eventually verbal order to titrate off of neomycin. Continuously monitoring and updating MD [place]. Will continue to monitor pt.

## 2018-12-09 NOTE — Progress Notes (Signed)
Pt hypotensive with systolic in the 88'Q and diastolic in the 91'Q. Dr. Ola Spurr notified. Will try to get an IV access and continue to monitor.  0700 update= 111/52. Dr. Lissa Hoard aware. No new orders given.

## 2018-12-09 NOTE — Op Note (Signed)
Date: Dec 09, 2018  Preoperative diagnosis: End-stage renal disease  Postoperative diagnosis: Same  Procedure: 1.  Left upper extremity AV graft (4 mm x 7 mm tapered Gore-Tex graft)  Surgeon: Dr. Marty Heck, MD  Assistant: Roxy Horseman, PA  Indications: Patient is a 63 year old male who was previously seen in consultation by Dr. Trula Slade for possible femoral AV graft placement.  Patient has failed bilateral upper extremity graft placements in the past.  Ultimately patient refused femoral graft placement and underwent bilateral upper extremity venogram to evaluate further options.  Right upper extremity venogram showed central venous occlusion.  On the left he did not have a central venous occlusion but did have a previous failed forearm loop graft and upper arm graft.  There was one remaining patent brachial vein and a patent axillary vein so we recommended an additional attempt at left upper extremity AV graft placement.  He presents today after risks benefits were discussed.  Findings: Left forearm and left upper arm AV graft previously placed that are thrombosed.  A new left upper arm AV graft was placed and tunneled over the previous upper arm graft with a 4 mm x 7 mm tapered Gore-Tex graft.  This was sewn to brachial artery above the antecubitum and the other patent deep brachial vein up near the axilla for the venous outflow.  There was a excellent thrill in the graft at completion of the case.  Anesthesia: MAC  Details: The patient was taken to the operating room after informed consent was obtained.  He was placed on the operating table in supine position.  His left arm was prepped and draped in usual sterile fashion.  A preop timeout was performed to identify patient, procedure and site.  Initially used ultrasound and identified the brachial artery above the antecubitum just above his previous upper arm graft that was thrombosed and this was marked on the skin.  I then  went up to his axilla and identified one remaining deep brachial vein that was patent as correlated on his venogram and this was different than where his previous graft was anastomosed.  This was also marked on the skin.  1% lidocaine without epinephrine was then injected.  I then made a longitudinal incision over the brachial artery at the antecubitum and then another one up in the axilla.  Dissected down with Bovie cautery blunt dissection initially isolated the brachial artery and used Metzenbaum scissors.  Vesseloops were then placed around the artery proximally and distally.  It should be noted that this artery was isolated just proximal to where his previous graft was sewn.  I then went up in the axilla dissected down to this deep brachial vein that we identified that was patent and compressible on ultrasound.  This was dissected out again with Bovie cautery and blunt dissection.  I controlled all the branches with small Vesseloops and then prepared to use a Henley clamp on the axillary vein.  At that point a curved tunneler was brought on the field I tunneled from the brachial artery exposure up to the axillary vein exposure above the muscle fasica.  This was tunneled on top of the fascia and on top of his previous graft in the upper arm.  A 4 mm x 7 mm tapered graft was then placed and the tunneler was removed leaving the graft in place.  I then spatulated the end of the 4 mm graft and the patient was given 3000 units of IV heparin.  Once  we pulled up on the Vesseloops we opened the artery with 11 blade scalpel extended with Potts scissors.  A running anastomosis performed the artery with a 4 mm of the graft with a 6-0 Prolene in end to side fashion.  Once released our clamps there was excellent pulsatile inflow in the graft.  I then flushed the graft retrograde with heparinized saline and reclamped on the graft to give flow down the arm.  I then pulled the graft to the appropriate length spatulated and then  use a Henley clamp for control of the axillary vein that had  Been exposed.  I then opened the deep brachial vein onto axillary vein there was excellent backbleeding.  This was flushed with heparinized saline.  I then spatulated the graft and sewed a running anastomosis 6-0 Prolene in end to side fashion.  The clamps were released and there was excellent thrill in the graft.  Both incisions were washed out copiously.  I then used 3-0 Vicryl in running fashion to close subcutaneous tissue in each incision.  4-0 Monocryl was used to close the skin.  He was taken to PACU in stable condition.  Complications: None  Condition: Stable  Marty Heck, MD [facility] Office: [phone] Pager: [place]

## 2018-12-10 ENCOUNTER — Encounter (HOSPITAL_COMMUNITY): Payer: Self-pay | Admitting: Vascular Surgery

## 2018-12-10 ENCOUNTER — Other Ambulatory Visit: Payer: Self-pay | Admitting: Physician Assistant

## 2018-12-10 DIAGNOSIS — D631 Anemia in chronic kidney disease: Secondary | ICD-10-CM | POA: Diagnosis not present

## 2018-12-10 DIAGNOSIS — N186 End stage renal disease: Secondary | ICD-10-CM | POA: Diagnosis not present

## 2018-12-10 DIAGNOSIS — N2581 Secondary hyperparathyroidism of renal origin: Secondary | ICD-10-CM | POA: Diagnosis not present

## 2018-12-13 DIAGNOSIS — N186 End stage renal disease: Secondary | ICD-10-CM | POA: Diagnosis not present

## 2018-12-13 DIAGNOSIS — N2581 Secondary hyperparathyroidism of renal origin: Secondary | ICD-10-CM | POA: Diagnosis not present

## 2018-12-13 DIAGNOSIS — D631 Anemia in chronic kidney disease: Secondary | ICD-10-CM | POA: Diagnosis not present

## 2018-12-15 DIAGNOSIS — D631 Anemia in chronic kidney disease: Secondary | ICD-10-CM | POA: Diagnosis not present

## 2018-12-15 DIAGNOSIS — N186 End stage renal disease: Secondary | ICD-10-CM | POA: Diagnosis not present

## 2018-12-15 DIAGNOSIS — N2581 Secondary hyperparathyroidism of renal origin: Secondary | ICD-10-CM | POA: Diagnosis not present

## 2018-12-17 DIAGNOSIS — D631 Anemia in chronic kidney disease: Secondary | ICD-10-CM | POA: Diagnosis not present

## 2018-12-17 DIAGNOSIS — N186 End stage renal disease: Secondary | ICD-10-CM | POA: Diagnosis not present

## 2018-12-17 DIAGNOSIS — N2581 Secondary hyperparathyroidism of renal origin: Secondary | ICD-10-CM | POA: Diagnosis not present

## 2018-12-20 DIAGNOSIS — N2581 Secondary hyperparathyroidism of renal origin: Secondary | ICD-10-CM | POA: Diagnosis not present

## 2018-12-20 DIAGNOSIS — D631 Anemia in chronic kidney disease: Secondary | ICD-10-CM | POA: Diagnosis not present

## 2018-12-20 DIAGNOSIS — N186 End stage renal disease: Secondary | ICD-10-CM | POA: Diagnosis not present

## 2018-12-20 NOTE — Anesthesia Postprocedure Evaluation (Signed)
Anesthesia Post Note  Patient: Patrick [name]  Procedure(s) Performed: Insertion Of Arteriovenous (Av) Gore-Tex Graft Arm, left arm (Left Arm Upper)     Anesthesia Type: MAC    Last Vitals:  Vitals:   12/09/18 1145 12/09/18 1200  BP: (!) 89/56 (!) 78/55  Pulse: (!) 57 (!) 54  Resp: 19 16  Temp:    SpO2: 100% 96%    Last Pain:  Vitals:   12/09/18 1145  TempSrc:   PainSc: 0-No pain                 Nolon Nations

## 2018-12-22 DIAGNOSIS — D631 Anemia in chronic kidney disease: Secondary | ICD-10-CM | POA: Diagnosis not present

## 2018-12-22 DIAGNOSIS — N2581 Secondary hyperparathyroidism of renal origin: Secondary | ICD-10-CM | POA: Diagnosis not present

## 2018-12-22 DIAGNOSIS — N186 End stage renal disease: Secondary | ICD-10-CM | POA: Diagnosis not present

## 2018-12-24 DIAGNOSIS — N2581 Secondary hyperparathyroidism of renal origin: Secondary | ICD-10-CM | POA: Diagnosis not present

## 2018-12-24 DIAGNOSIS — D631 Anemia in chronic kidney disease: Secondary | ICD-10-CM | POA: Diagnosis not present

## 2018-12-24 DIAGNOSIS — N186 End stage renal disease: Secondary | ICD-10-CM | POA: Diagnosis not present

## 2018-12-27 DIAGNOSIS — N186 End stage renal disease: Secondary | ICD-10-CM | POA: Diagnosis not present

## 2018-12-27 DIAGNOSIS — N2581 Secondary hyperparathyroidism of renal origin: Secondary | ICD-10-CM | POA: Diagnosis not present

## 2018-12-27 DIAGNOSIS — D631 Anemia in chronic kidney disease: Secondary | ICD-10-CM | POA: Diagnosis not present

## 2018-12-27 DIAGNOSIS — Z992 Dependence on renal dialysis: Secondary | ICD-10-CM | POA: Diagnosis not present

## 2018-12-27 DIAGNOSIS — T8610 Unspecified complication of kidney transplant: Secondary | ICD-10-CM | POA: Diagnosis not present

## 2018-12-27 DIAGNOSIS — D509 Iron deficiency anemia, unspecified: Secondary | ICD-10-CM | POA: Diagnosis not present

## 2018-12-29 DIAGNOSIS — N2581 Secondary hyperparathyroidism of renal origin: Secondary | ICD-10-CM | POA: Diagnosis not present

## 2018-12-29 DIAGNOSIS — D509 Iron deficiency anemia, unspecified: Secondary | ICD-10-CM | POA: Diagnosis not present

## 2018-12-29 DIAGNOSIS — D631 Anemia in chronic kidney disease: Secondary | ICD-10-CM | POA: Diagnosis not present

## 2018-12-29 DIAGNOSIS — N186 End stage renal disease: Secondary | ICD-10-CM | POA: Diagnosis not present

## 2018-12-31 DIAGNOSIS — D509 Iron deficiency anemia, unspecified: Secondary | ICD-10-CM | POA: Diagnosis not present

## 2018-12-31 DIAGNOSIS — N186 End stage renal disease: Secondary | ICD-10-CM | POA: Diagnosis not present

## 2018-12-31 DIAGNOSIS — D631 Anemia in chronic kidney disease: Secondary | ICD-10-CM | POA: Diagnosis not present

## 2018-12-31 DIAGNOSIS — N2581 Secondary hyperparathyroidism of renal origin: Secondary | ICD-10-CM | POA: Diagnosis not present

## 2019-01-03 DIAGNOSIS — N186 End stage renal disease: Secondary | ICD-10-CM | POA: Diagnosis not present

## 2019-01-03 DIAGNOSIS — D631 Anemia in chronic kidney disease: Secondary | ICD-10-CM | POA: Diagnosis not present

## 2019-01-03 DIAGNOSIS — D509 Iron deficiency anemia, unspecified: Secondary | ICD-10-CM | POA: Diagnosis not present

## 2019-01-03 DIAGNOSIS — N2581 Secondary hyperparathyroidism of renal origin: Secondary | ICD-10-CM | POA: Diagnosis not present

## 2019-01-05 DIAGNOSIS — D509 Iron deficiency anemia, unspecified: Secondary | ICD-10-CM | POA: Diagnosis not present

## 2019-01-05 DIAGNOSIS — D631 Anemia in chronic kidney disease: Secondary | ICD-10-CM | POA: Diagnosis not present

## 2019-01-05 DIAGNOSIS — N186 End stage renal disease: Secondary | ICD-10-CM | POA: Diagnosis not present

## 2019-01-05 DIAGNOSIS — N2581 Secondary hyperparathyroidism of renal origin: Secondary | ICD-10-CM | POA: Diagnosis not present

## 2019-01-07 DIAGNOSIS — D631 Anemia in chronic kidney disease: Secondary | ICD-10-CM | POA: Diagnosis not present

## 2019-01-07 DIAGNOSIS — D509 Iron deficiency anemia, unspecified: Secondary | ICD-10-CM | POA: Diagnosis not present

## 2019-01-07 DIAGNOSIS — N2581 Secondary hyperparathyroidism of renal origin: Secondary | ICD-10-CM | POA: Diagnosis not present

## 2019-01-07 DIAGNOSIS — N186 End stage renal disease: Secondary | ICD-10-CM | POA: Diagnosis not present

## 2019-01-10 DIAGNOSIS — D509 Iron deficiency anemia, unspecified: Secondary | ICD-10-CM | POA: Insufficient documentation

## 2019-01-10 DIAGNOSIS — N186 End stage renal disease: Secondary | ICD-10-CM | POA: Diagnosis not present

## 2019-01-10 DIAGNOSIS — N2581 Secondary hyperparathyroidism of renal origin: Secondary | ICD-10-CM | POA: Diagnosis not present

## 2019-01-10 DIAGNOSIS — D631 Anemia in chronic kidney disease: Secondary | ICD-10-CM | POA: Diagnosis not present

## 2019-01-12 DIAGNOSIS — D631 Anemia in chronic kidney disease: Secondary | ICD-10-CM | POA: Diagnosis not present

## 2019-01-12 DIAGNOSIS — N186 End stage renal disease: Secondary | ICD-10-CM | POA: Diagnosis not present

## 2019-01-12 DIAGNOSIS — D509 Iron deficiency anemia, unspecified: Secondary | ICD-10-CM | POA: Diagnosis not present

## 2019-01-12 DIAGNOSIS — N2581 Secondary hyperparathyroidism of renal origin: Secondary | ICD-10-CM | POA: Diagnosis not present

## 2019-01-14 DIAGNOSIS — N186 End stage renal disease: Secondary | ICD-10-CM | POA: Diagnosis not present

## 2019-01-14 DIAGNOSIS — D631 Anemia in chronic kidney disease: Secondary | ICD-10-CM | POA: Diagnosis not present

## 2019-01-14 DIAGNOSIS — D509 Iron deficiency anemia, unspecified: Secondary | ICD-10-CM | POA: Diagnosis not present

## 2019-01-14 DIAGNOSIS — N2581 Secondary hyperparathyroidism of renal origin: Secondary | ICD-10-CM | POA: Diagnosis not present

## 2019-01-17 DIAGNOSIS — N186 End stage renal disease: Secondary | ICD-10-CM | POA: Diagnosis not present

## 2019-01-17 DIAGNOSIS — D631 Anemia in chronic kidney disease: Secondary | ICD-10-CM | POA: Diagnosis not present

## 2019-01-17 DIAGNOSIS — N2581 Secondary hyperparathyroidism of renal origin: Secondary | ICD-10-CM | POA: Diagnosis not present

## 2019-01-17 DIAGNOSIS — D509 Iron deficiency anemia, unspecified: Secondary | ICD-10-CM | POA: Diagnosis not present

## 2019-01-19 DIAGNOSIS — D631 Anemia in chronic kidney disease: Secondary | ICD-10-CM | POA: Diagnosis not present

## 2019-01-19 DIAGNOSIS — N186 End stage renal disease: Secondary | ICD-10-CM | POA: Diagnosis not present

## 2019-01-19 DIAGNOSIS — D509 Iron deficiency anemia, unspecified: Secondary | ICD-10-CM | POA: Diagnosis not present

## 2019-01-19 DIAGNOSIS — N2581 Secondary hyperparathyroidism of renal origin: Secondary | ICD-10-CM | POA: Diagnosis not present

## 2019-01-21 DIAGNOSIS — D509 Iron deficiency anemia, unspecified: Secondary | ICD-10-CM | POA: Diagnosis not present

## 2019-01-21 DIAGNOSIS — N2581 Secondary hyperparathyroidism of renal origin: Secondary | ICD-10-CM | POA: Diagnosis not present

## 2019-01-21 DIAGNOSIS — N186 End stage renal disease: Secondary | ICD-10-CM | POA: Diagnosis not present

## 2019-01-21 DIAGNOSIS — D631 Anemia in chronic kidney disease: Secondary | ICD-10-CM | POA: Diagnosis not present

## 2019-01-24 ENCOUNTER — Telehealth: Payer: Self-pay | Admitting: Physician Assistant

## 2019-01-24 DIAGNOSIS — N2581 Secondary hyperparathyroidism of renal origin: Secondary | ICD-10-CM | POA: Diagnosis not present

## 2019-01-24 DIAGNOSIS — D509 Iron deficiency anemia, unspecified: Secondary | ICD-10-CM | POA: Diagnosis not present

## 2019-01-24 DIAGNOSIS — D631 Anemia in chronic kidney disease: Secondary | ICD-10-CM | POA: Diagnosis not present

## 2019-01-24 DIAGNOSIS — N186 End stage renal disease: Secondary | ICD-10-CM | POA: Diagnosis not present

## 2019-01-24 NOTE — Telephone Encounter (Signed)
New Message     Left voicemail to confirm and answer COVID questions

## 2019-01-24 NOTE — Progress Notes (Deleted)
Cardiology Office Note:    Date:  01/24/2019   ID:  [name], DOB Apr 12, 1956, MRN [id number]  PCP:  Nolene Ebbs, MD  Cardiologist:  Dorris Carnes, MD *** Electrophysiologist:  Will Meredith Leeds, MD   Referring MD: Nolene Ebbs, MD   No chief complaint on file. ***  History of Present Illness:    Patrick [name] is a 63 y.o. 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  He was admitted in 10/2018 with hypokalemia and elevated Troponin (1.13).  This was felt to be demand ischemia and a follow up nuclear stress test was recommended in 8 weeks.  However, he came back to the ED with chest pain and his Troponin was 0.33.  There were lateral T wave inversions and admission for Cardiac Catheterization was recommended.  He declined.  He was last seen in clinic by [facility], PA-C 11/08/2018.  His chest pain was improved but not resolved.  His Imdur was increased to 60 mg once daily.    ***  Prior CV studies:   The following studies were reviewed today:  *** Echo 08/20/2018 Mild LVH, EF 50-55, no RWMA, Gr 1 DD, mild AS (mean 11), trivial AI, severely MAC  Cardiac Catheterization 12/04/17 LAD proximal 100 (left to left and right-to-left collaterals); D1 ostial 50 OM1 50 RCA proximal 50, mid 60 LIMA-LAD atretic Medical therapy; approach to LAD makes CTO intervention difficult   Nuclear stress test 09/02/17 EF 53, anteroseptal/apical anterior/apical septal/apical lateral/apical scar, no ischemia   Echo 01/25/16 Mild concentric LVH, EF 45-50, diffuse HK, grade 2 diastolic  dysfunction, very mild aortic stenosis (mean 13, peak 26), MAC, mild MR, mild LAE   Nuclear stress test 01/08/15 (Dr. Einar Gip) EF 55, very small area of ischemia in the basal inferior, inferoapical and apical septal regions; low risk   Echo 11/29/13 Mild LVH, EF 36-64, grade 1 diastolic dysfunction, MAC, mild LAE   Nuclear stress test 10/10/13 ([facility])  FINAL COMMENTS  - Moderate apical and periapical (i.e. apex, apical anterior/ lateral/ inferior segments) ischemia status post regadenoson stress.  - LVEF= 63%. Septal dyskinesis in a post- CABG pattern. Otherwise normal systolic function.  - Mild RV uptake, suggestive of RV hypertrophy  Past Medical History:  Diagnosis Date  . Arthritis   . Atrial flutter ([place])    Ablation 2018  . 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 Erectile dysfunction   . ESRD (end stage renal disease) on dialysis ([place])    Pt on HD 2003 >> transplanted in 2009, back on HD in 2016. [name] TTS.   . Essential hypertension   . Gout   . Hernia of abdominal wall   . History of blood transfusion   . History of cardiomyopathy   . History of kidney stones   . History of pneumonia   .  Migraine   . Myocardial infarction ([place])    1996  . Pneumonia   . Secondary hyperparathyroidism ([place])   . Type 2 diabetes mellitus ([place])   . Wears glasses    Surgical Hx: The patient  has a past surgical history that includes Kidney transplant (2009); Appendectomy; AV fistula placement (Right, 09/18/2014); ir generic historical (05/11/2016); ir generic historical (05/11/2016); ir generic historical (05/11/2016); ir generic historical (05/11/2016); Cardiac catheterization (N/A, 06/04/2016); AV fistula placement (Left, 07/07/2016); Hernia repair (2017); Nephrectomy (2017); Cardiac catheterization (~ 2016); A-FLUTTER  ABLATION (N/A, 09/24/2016); Coronary artery bypass graft (1997); ABDOMINAL AORTOGRAM W/LOWER EXTREMITY (N/A, 04/13/2017); PERIPHERAL VASCULAR INTERVENTION (04/13/2017); Thrombectomy w/ embolectomy (Left, 09/14/2017); Thrombectomy and revision of arterioventous (av) goretex  graft (Left, 10/08/2017); Insertion of dialysis catheter (N/A, 10/08/2017); UPPER EXTREMITY VENOGRAPHY (N/A, 11/16/2017); LEFT HEART CATH AND CORS/GRAFTS ANGIOGRAPHY (N/A, 12/04/2017); Colonoscopy; Exchange of a dialysis catheter (Left, 01/11/2018); IR Fluoro Guide CV Line Left (03/12/2018); UPPER EXTREMITY VENOGRAPHY (Bilateral, 10/29/2018); Upper Extremity Angiography (Bilateral, 10/29/2018); and AV fistula placement (Left, 12/09/2018).   Current Medications: No outpatient medications have been marked as taking for the 01/25/19 encounter (Appointment) with Richardson Dopp T, PA-C.     Allergies:   Baclofen, Iodinated diagnostic agents, Lipitor [atorvastatin], and Metoprolol   Social History   Tobacco Use  . Smoking status: Former Smoker    Types: Cigarettes    Quit date: 07/04/2014    Years since quitting: 4.5  . Smokeless tobacco: Never Used  . Tobacco comment: "smoked ~ 1 pack/month when I did smoke; never a steady smoker"  Substance Use Topics  . Alcohol use: Yes    Alcohol/week: 0.0 standard drinks    Comment: rare  "2 drinks, 1-2 times/year"  . Drug use: No     Family Hx: The patient's family history includes HIV in his sister; Hyperlipidemia in his mother; Hypertension in his mother.  ROS:   Please see the history of present illness.    ROS All other systems reviewed and are negative.   EKGs/Labs/Other Test Reviewed:    EKG:  EKG is *** ordered today.  The ekg ordered today demonstrates ***  Recent Labs: 05/12/2018: Magnesium 1.9 10/27/2018: ALT 34; B Natriuretic Peptide 3,129.6 10/30/2018: BUN 31; Creatinine, Ser 7.83; Platelets 200 12/09/2018: Hemoglobin 11.9; Potassium 4.8; Sodium 139   Recent Lipid Panel Lab Results   Component Value Date/Time   CHOL 176 08/20/2018 07:23 AM   CHOL 176 07/02/2018 07:35 AM   TRIG 179 (H) 08/20/2018 07:23 AM   HDL 45 08/20/2018 07:23 AM   HDL 39 (L) 07/02/2018 07:35 AM   CHOLHDL 3.9 08/20/2018 07:23 AM   LDLCALC 95 08/20/2018 07:23 AM   LDLCALC 89 07/02/2018 07:35 AM    Physical Exam:    VS:  There were no vitals taken for this visit.    Wt Readings from Last 3 Encounters:  12/09/18 208 lb (94.3 kg)  11/08/18 208 lb (94.3 kg)  10/30/18 205 lb (93 kg)     ***Physical Exam  ASSESSMENT & PLAN:    *** Coronary artery disease involving native coronary artery of native heart with angina pectoris (HCC) Hx of remote CABG.  Cardiac Catheterization in May 2019 demonstrated an atretic L-LAD and a chronically occluded LAD with L-L and R-L collaterals.  CTO PCI would be difficult and medical Rx was recommended.  He is overall stable with CCS Class II angina.  I will increase his dose of nitrates to see if we can control his symptoms more.  as far as renal transplant goes, his perioperative risk of major cardiac event is high at 11% according to the Revised Cardiac Risk Index (RCRI).  Given the results of his recent Cardiac Catheterization and his stable symptoms, he does not require any further cardiac testing.  I discussed his case with Dr. [name] who agreed he would be high risk for any procedure.  He would have to meet with the transplant surgeon to determine if he is a candidate for transplant.               - Increase Imdur to 90 mg QD             - Continue ASA, Plavix, Amlodpine, Ezetimibe, Rosuvastatin   Chronic diastolic CHF (congestive heart failure) ([place]) EF 45-50 by Echo in 2017.  Volume status appears stable.  His volume is managed by dialysis.   ESRD (end stage renal disease) ([place]) He remains on Tues, Thurs, Sat dialysis.     Essential hypertension The patient's blood pressure is controlled on his current regimen.  Continue current therapy.      Hyperlipidemia, unspecified hyperlipidemia type Continue statin, ezetimibe.    Typical atrial flutter (HCC) No recurrence since his ablation in 2018.  He is note on long term anticoagulation.    Dispo:  No follow-ups on file.   Medication Adjustments/Labs and Tests Ordered: Current medicines are reviewed at length with the patient today.  Concerns regarding medicines are outlined above.  Tests Ordered: No orders of the defined types were placed in this encounter.  Medication Changes: No orders of the defined types were placed in this encounter.   Signed, Richardson Dopp, PA-C  01/24/2019 9:01 PM    [facility] [place], [place],   [address] Phone: [phone]; Fax: [phone]

## 2019-01-25 ENCOUNTER — Ambulatory Visit: Payer: Medicare Other | Admitting: Physician Assistant

## 2019-01-26 DIAGNOSIS — N2581 Secondary hyperparathyroidism of renal origin: Secondary | ICD-10-CM | POA: Diagnosis not present

## 2019-01-26 DIAGNOSIS — D509 Iron deficiency anemia, unspecified: Secondary | ICD-10-CM | POA: Diagnosis not present

## 2019-01-26 DIAGNOSIS — T8610 Unspecified complication of kidney transplant: Secondary | ICD-10-CM | POA: Diagnosis not present

## 2019-01-26 DIAGNOSIS — Z992 Dependence on renal dialysis: Secondary | ICD-10-CM | POA: Diagnosis not present

## 2019-01-26 DIAGNOSIS — N186 End stage renal disease: Secondary | ICD-10-CM | POA: Diagnosis not present

## 2019-01-28 DIAGNOSIS — N2581 Secondary hyperparathyroidism of renal origin: Secondary | ICD-10-CM | POA: Diagnosis not present

## 2019-01-28 DIAGNOSIS — N186 End stage renal disease: Secondary | ICD-10-CM | POA: Diagnosis not present

## 2019-01-28 DIAGNOSIS — D509 Iron deficiency anemia, unspecified: Secondary | ICD-10-CM | POA: Diagnosis not present

## 2019-01-31 DIAGNOSIS — N2581 Secondary hyperparathyroidism of renal origin: Secondary | ICD-10-CM | POA: Diagnosis not present

## 2019-01-31 DIAGNOSIS — N186 End stage renal disease: Secondary | ICD-10-CM | POA: Diagnosis not present

## 2019-01-31 DIAGNOSIS — D509 Iron deficiency anemia, unspecified: Secondary | ICD-10-CM | POA: Diagnosis not present

## 2019-02-02 DIAGNOSIS — D509 Iron deficiency anemia, unspecified: Secondary | ICD-10-CM | POA: Diagnosis not present

## 2019-02-02 DIAGNOSIS — N2581 Secondary hyperparathyroidism of renal origin: Secondary | ICD-10-CM | POA: Diagnosis not present

## 2019-02-02 DIAGNOSIS — N186 End stage renal disease: Secondary | ICD-10-CM | POA: Diagnosis not present

## 2019-02-04 DIAGNOSIS — N2581 Secondary hyperparathyroidism of renal origin: Secondary | ICD-10-CM | POA: Diagnosis not present

## 2019-02-04 DIAGNOSIS — D509 Iron deficiency anemia, unspecified: Secondary | ICD-10-CM | POA: Diagnosis not present

## 2019-02-04 DIAGNOSIS — N186 End stage renal disease: Secondary | ICD-10-CM | POA: Diagnosis not present

## 2019-02-07 DIAGNOSIS — N2581 Secondary hyperparathyroidism of renal origin: Secondary | ICD-10-CM | POA: Diagnosis not present

## 2019-02-07 DIAGNOSIS — N186 End stage renal disease: Secondary | ICD-10-CM | POA: Diagnosis not present

## 2019-02-07 DIAGNOSIS — D509 Iron deficiency anemia, unspecified: Secondary | ICD-10-CM | POA: Diagnosis not present

## 2019-02-09 DIAGNOSIS — N2581 Secondary hyperparathyroidism of renal origin: Secondary | ICD-10-CM | POA: Diagnosis not present

## 2019-02-09 DIAGNOSIS — N186 End stage renal disease: Secondary | ICD-10-CM | POA: Diagnosis not present

## 2019-02-09 DIAGNOSIS — D509 Iron deficiency anemia, unspecified: Secondary | ICD-10-CM | POA: Diagnosis not present

## 2019-02-11 DIAGNOSIS — N2581 Secondary hyperparathyroidism of renal origin: Secondary | ICD-10-CM | POA: Diagnosis not present

## 2019-02-11 DIAGNOSIS — D509 Iron deficiency anemia, unspecified: Secondary | ICD-10-CM | POA: Diagnosis not present

## 2019-02-11 DIAGNOSIS — N186 End stage renal disease: Secondary | ICD-10-CM | POA: Diagnosis not present

## 2019-02-14 ENCOUNTER — Telehealth: Payer: Self-pay | Admitting: *Deleted

## 2019-02-14 ENCOUNTER — Encounter (HOSPITAL_COMMUNITY): Payer: Self-pay | Admitting: Emergency Medicine

## 2019-02-14 ENCOUNTER — Inpatient Hospital Stay (HOSPITAL_COMMUNITY)
Admission: EM | Admit: 2019-02-14 | Discharge: 2019-02-17 | DRG: 246 | Disposition: A | Discharge status: Home / Self Care | Payer: Medicare Other | Source: Other Acute Inpatient Hospital | Attending: Internal Medicine | Admitting: Internal Medicine

## 2019-02-14 ENCOUNTER — Emergency Department (HOSPITAL_COMMUNITY): Payer: Medicare Other

## 2019-02-14 DIAGNOSIS — E1169 Type 2 diabetes mellitus with other specified complication: Secondary | ICD-10-CM | POA: Diagnosis not present

## 2019-02-14 DIAGNOSIS — T8612 Kidney transplant failure: Secondary | ICD-10-CM | POA: Diagnosis present

## 2019-02-14 DIAGNOSIS — Z951 Presence of aortocoronary bypass graft: Secondary | ICD-10-CM

## 2019-02-14 DIAGNOSIS — E118 Type 2 diabetes mellitus with unspecified complications: Secondary | ICD-10-CM | POA: Diagnosis present

## 2019-02-14 DIAGNOSIS — Z79899 Other long term (current) drug therapy: Secondary | ICD-10-CM

## 2019-02-14 DIAGNOSIS — I252 Old myocardial infarction: Secondary | ICD-10-CM | POA: Diagnosis not present

## 2019-02-14 DIAGNOSIS — Y830 Surgical operation with transplant of whole organ as the cause of abnormal reaction of the patient, or of later complication, without mention of misadventure at the time of the procedure: Secondary | ICD-10-CM | POA: Diagnosis present

## 2019-02-14 DIAGNOSIS — K469 Unspecified abdominal hernia without obstruction or gangrene: Secondary | ICD-10-CM | POA: Diagnosis present

## 2019-02-14 DIAGNOSIS — R0600 Dyspnea, unspecified: Secondary | ICD-10-CM | POA: Diagnosis not present

## 2019-02-14 DIAGNOSIS — E1151 Type 2 diabetes mellitus with diabetic peripheral angiopathy without gangrene: Secondary | ICD-10-CM | POA: Diagnosis present

## 2019-02-14 DIAGNOSIS — J90 Pleural effusion, not elsewhere classified: Secondary | ICD-10-CM | POA: Diagnosis not present

## 2019-02-14 DIAGNOSIS — R0602 Shortness of breath: Secondary | ICD-10-CM | POA: Diagnosis not present

## 2019-02-14 DIAGNOSIS — E785 Hyperlipidemia, unspecified: Secondary | ICD-10-CM | POA: Diagnosis present

## 2019-02-14 DIAGNOSIS — Z8701 Personal history of pneumonia (recurrent): Secondary | ICD-10-CM

## 2019-02-14 DIAGNOSIS — Z888 Allergy status to other drugs, medicaments and biological substances status: Secondary | ICD-10-CM

## 2019-02-14 DIAGNOSIS — I2582 Chronic total occlusion of coronary artery: Secondary | ICD-10-CM | POA: Diagnosis present

## 2019-02-14 DIAGNOSIS — E1122 Type 2 diabetes mellitus with diabetic chronic kidney disease: Secondary | ICD-10-CM | POA: Diagnosis present

## 2019-02-14 DIAGNOSIS — I5042 Chronic combined systolic (congestive) and diastolic (congestive) heart failure: Secondary | ICD-10-CM | POA: Diagnosis present

## 2019-02-14 DIAGNOSIS — Z20828 Contact with and (suspected) exposure to other viral communicable diseases: Secondary | ICD-10-CM | POA: Diagnosis present

## 2019-02-14 DIAGNOSIS — M109 Gout, unspecified: Secondary | ICD-10-CM | POA: Diagnosis present

## 2019-02-14 DIAGNOSIS — Z87442 Personal history of urinary calculi: Secondary | ICD-10-CM | POA: Diagnosis not present

## 2019-02-14 DIAGNOSIS — D649 Anemia, unspecified: Secondary | ICD-10-CM | POA: Diagnosis present

## 2019-02-14 DIAGNOSIS — Z905 Acquired absence of kidney: Secondary | ICD-10-CM

## 2019-02-14 DIAGNOSIS — I5033 Acute on chronic diastolic (congestive) heart failure: Secondary | ICD-10-CM | POA: Diagnosis not present

## 2019-02-14 DIAGNOSIS — Z87891 Personal history of nicotine dependence: Secondary | ICD-10-CM

## 2019-02-14 DIAGNOSIS — R001 Bradycardia, unspecified: Secondary | ICD-10-CM | POA: Diagnosis not present

## 2019-02-14 DIAGNOSIS — I429 Cardiomyopathy, unspecified: Secondary | ICD-10-CM | POA: Diagnosis present

## 2019-02-14 DIAGNOSIS — N2581 Secondary hyperparathyroidism of renal origin: Secondary | ICD-10-CM | POA: Diagnosis present

## 2019-02-14 DIAGNOSIS — D631 Anemia in chronic kidney disease: Secondary | ICD-10-CM | POA: Diagnosis present

## 2019-02-14 DIAGNOSIS — I9589 Other hypotension: Secondary | ICD-10-CM | POA: Diagnosis present

## 2019-02-14 DIAGNOSIS — I4892 Unspecified atrial flutter: Secondary | ICD-10-CM | POA: Diagnosis present

## 2019-02-14 DIAGNOSIS — I25110 Atherosclerotic heart disease of native coronary artery with unstable angina pectoris: Secondary | ICD-10-CM | POA: Diagnosis present

## 2019-02-14 DIAGNOSIS — Z7982 Long term (current) use of aspirin: Secondary | ICD-10-CM

## 2019-02-14 DIAGNOSIS — I200 Unstable angina: Secondary | ICD-10-CM | POA: Diagnosis not present

## 2019-02-14 DIAGNOSIS — R079 Chest pain, unspecified: Secondary | ICD-10-CM | POA: Diagnosis not present

## 2019-02-14 DIAGNOSIS — I132 Hypertensive heart and chronic kidney disease with heart failure and with stage 5 chronic kidney disease, or end stage renal disease: Secondary | ICD-10-CM | POA: Diagnosis present

## 2019-02-14 DIAGNOSIS — I959 Hypotension, unspecified: Secondary | ICD-10-CM | POA: Diagnosis not present

## 2019-02-14 DIAGNOSIS — Z955 Presence of coronary angioplasty implant and graft: Secondary | ICD-10-CM

## 2019-02-14 DIAGNOSIS — N186 End stage renal disease: Secondary | ICD-10-CM | POA: Diagnosis present

## 2019-02-14 DIAGNOSIS — Z7984 Long term (current) use of oral hypoglycemic drugs: Secondary | ICD-10-CM

## 2019-02-14 DIAGNOSIS — Z91041 Radiographic dye allergy status: Secondary | ICD-10-CM

## 2019-02-14 DIAGNOSIS — D638 Anemia in other chronic diseases classified elsewhere: Secondary | ICD-10-CM | POA: Diagnosis not present

## 2019-02-14 DIAGNOSIS — I249 Acute ischemic heart disease, unspecified: Secondary | ICD-10-CM | POA: Diagnosis present

## 2019-02-14 DIAGNOSIS — Q245 Malformation of coronary vessels: Secondary | ICD-10-CM

## 2019-02-14 DIAGNOSIS — Z8249 Family history of ischemic heart disease and other diseases of the circulatory system: Secondary | ICD-10-CM

## 2019-02-14 DIAGNOSIS — Z7902 Long term (current) use of antithrombotics/antiplatelets: Secondary | ICD-10-CM

## 2019-02-14 DIAGNOSIS — Z8349 Family history of other endocrine, nutritional and metabolic diseases: Secondary | ICD-10-CM

## 2019-02-14 DIAGNOSIS — I953 Hypotension of hemodialysis: Secondary | ICD-10-CM | POA: Diagnosis not present

## 2019-02-14 DIAGNOSIS — Z992 Dependence on renal dialysis: Secondary | ICD-10-CM | POA: Diagnosis not present

## 2019-02-14 DIAGNOSIS — D509 Iron deficiency anemia, unspecified: Secondary | ICD-10-CM | POA: Diagnosis not present

## 2019-02-14 DIAGNOSIS — I120 Hypertensive chronic kidney disease with stage 5 chronic kidney disease or end stage renal disease: Secondary | ICD-10-CM | POA: Diagnosis not present

## 2019-02-14 LAB — CBC WITH DIFFERENTIAL/PLATELET
Abs Immature Granulocytes: 0.01 10*3/uL (ref 0.00–0.07)
Basophils Absolute: 0.1 10*3/uL (ref 0.0–0.1)
Basophils Relative: 1 %
Eosinophils Absolute: 0.3 10*3/uL (ref 0.0–0.5)
Eosinophils Relative: 5 %
HCT: 37.4 % — ABNORMAL LOW (ref 39.0–52.0)
Hemoglobin: 12.1 g/dL — ABNORMAL LOW (ref 13.0–17.0)
Immature Granulocytes: 0 %
Lymphocytes Relative: 23 %
Lymphs Abs: 1.4 10*3/uL (ref 0.7–4.0)
MCH: 30.5 pg (ref 26.0–34.0)
MCHC: 32.4 g/dL (ref 30.0–36.0)
MCV: 94.2 fL (ref 80.0–100.0)
Monocytes Absolute: 0.4 10*3/uL (ref 0.1–1.0)
Monocytes Relative: 7 %
Neutro Abs: 3.7 10*3/uL (ref 1.7–7.7)
Neutrophils Relative %: 64 %
Platelets: 170 10*3/uL (ref 150–400)
RBC: 3.97 MIL/uL — ABNORMAL LOW (ref 4.22–5.81)
RDW: 17.5 % — ABNORMAL HIGH (ref 11.5–15.5)
WBC: 5.9 10*3/uL (ref 4.0–10.5)
nRBC: 0 % (ref 0.0–0.2)

## 2019-02-14 LAB — BASIC METABOLIC PANEL
Anion gap: 15 (ref 5–15)
BUN: 28 mg/dL — ABNORMAL HIGH (ref 8–23)
CO2: 25 mmol/L (ref 22–32)
Calcium: 10.6 mg/dL — ABNORMAL HIGH (ref 8.9–10.3)
Chloride: 95 mmol/L — ABNORMAL LOW (ref 98–111)
Creatinine, Ser: 8.91 mg/dL — ABNORMAL HIGH (ref 0.61–1.24)
GFR calc Af Amer: 7 mL/min — ABNORMAL LOW (ref >60)
GFR calc non Af Amer: 6 mL/min — ABNORMAL LOW (ref >60)
Glucose, Bld: 86 mg/dL (ref 70–99)
Potassium: 3.7 mmol/L (ref 3.5–5.1)
Sodium: 135 mmol/L (ref 135–145)

## 2019-02-14 LAB — SARS CORONAVIRUS 2 BY RT PCR (HOSPITAL ORDER, PERFORMED IN ~~LOC~~ HOSPITAL LAB): SARS Coronavirus 2: NEGATIVE

## 2019-02-14 LAB — TROPONIN I (HIGH SENSITIVITY): Troponin I (High Sensitivity): 289 ng/L (ref <18)

## 2019-02-14 LAB — GLUCOSE, CAPILLARY: Glucose-Capillary: 113 mg/dL — ABNORMAL HIGH (ref 70–99)

## 2019-02-14 MED ORDER — OXYCODONE-ACETAMINOPHEN 5-325 MG PO TABS: 1.0000 | ORAL_TABLET | Freq: Four times a day (QID) | ORAL | Status: DC | PRN

## 2019-02-14 MED ORDER — ROSUVASTATIN CALCIUM 5 MG PO TABS
10.0000 mg | ORAL_TABLET | Freq: Every day | ORAL | Status: DC
  Administered 2019-02-15 – 2019-02-17 (×2): 10 mg via ORAL
  Filled 2019-02-14 (×2): qty 2

## 2019-02-14 MED ORDER — HEPARIN (PORCINE) 25000 UT/250ML-% IV SOLN
1150.0000 [IU]/h | INTRAVENOUS | Status: DC
  Administered 2019-02-14: 18:00:00 1150 [IU]/h via INTRAVENOUS
  Filled 2019-02-14: qty 250

## 2019-02-14 MED ORDER — CALCIUM ACETATE (PHOS BINDER) 667 MG PO CAPS
2668.0000 mg | ORAL_CAPSULE | Freq: Every day | ORAL | Status: DC
  Administered 2019-02-15: 17:00:00 2668 mg via ORAL
  Filled 2019-02-14: qty 4

## 2019-02-14 MED ORDER — BISOPROLOL FUMARATE 5 MG PO TABS
5.0000 mg | ORAL_TABLET | Freq: Every day | ORAL | Status: DC
  Administered 2019-02-15: 13:00:00 5 mg via ORAL
  Filled 2019-02-14: qty 1

## 2019-02-14 MED ORDER — ASPIRIN 81 MG PO CHEW
324.0000 mg | CHEWABLE_TABLET | Freq: Once | ORAL | Status: DC
  Filled 2019-02-14: qty 4

## 2019-02-14 MED ORDER — ASPIRIN EC 81 MG PO TBEC
81.0000 mg | DELAYED_RELEASE_TABLET | Freq: Every day | ORAL | Status: DC
  Administered 2019-02-16 – 2019-02-17 (×2): 81 mg via ORAL
  Filled 2019-02-14 (×2): qty 1

## 2019-02-14 MED ORDER — HEPARIN SODIUM (PORCINE) 5000 UNIT/ML IJ SOLN: 5000.0000 [IU] | Freq: Two times a day (BID) | INTRAMUSCULAR | Status: DC

## 2019-02-14 MED ORDER — SODIUM CHLORIDE 0.9% FLUSH: 3.0000 mL | Freq: Two times a day (BID) | INTRAVENOUS | Status: DC

## 2019-02-14 MED ORDER — RENA-VITE PO TABS
1.0000 | ORAL_TABLET | Freq: Every day | ORAL | Status: DC
  Administered 2019-02-15 – 2019-02-17 (×2): 1 via ORAL
  Filled 2019-02-14 (×2): qty 1

## 2019-02-14 MED ORDER — INSULIN ASPART 100 UNIT/ML ~~LOC~~ SOLN
0.0000 [IU] | Freq: Three times a day (TID) | SUBCUTANEOUS | Status: DC
  Administered 2019-02-15: 5 [IU] via SUBCUTANEOUS
  Administered 2019-02-16: 07:00:00 1 [IU] via SUBCUTANEOUS

## 2019-02-14 MED ORDER — ALBUTEROL SULFATE (2.5 MG/3ML) 0.083% IN NEBU: 2.5000 mg | INHALATION_SOLUTION | Freq: Four times a day (QID) | RESPIRATORY_TRACT | Status: DC | PRN

## 2019-02-14 MED ORDER — MORPHINE SULFATE (PF) 2 MG/ML IV SOLN: 2.0000 mg | INTRAVENOUS | Status: DC | PRN

## 2019-02-14 MED ORDER — HEPARIN BOLUS VIA INFUSION
4000.0000 [IU] | Freq: Once | INTRAVENOUS | Status: AC
  Administered 2019-02-14: 18:00:00 4000 [IU] via INTRAVENOUS
  Filled 2019-02-14: qty 4000

## 2019-02-14 MED ORDER — CLOPIDOGREL BISULFATE 75 MG PO TABS
75.0000 mg | ORAL_TABLET | Freq: Every day | ORAL | Status: DC
  Administered 2019-02-15 – 2019-02-17 (×3): 75 mg via ORAL
  Filled 2019-02-14 (×3): qty 1

## 2019-02-14 MED ORDER — PANTOPRAZOLE SODIUM 40 MG PO TBEC
40.0000 mg | DELAYED_RELEASE_TABLET | Freq: Every day | ORAL | Status: DC
  Administered 2019-02-17: 12:00:00 40 mg via ORAL
  Filled 2019-02-14 (×2): qty 1

## 2019-02-14 MED ORDER — ACETAMINOPHEN 500 MG PO TABS: 1000.0000 mg | ORAL_TABLET | Freq: Four times a day (QID) | ORAL | Status: DC | PRN

## 2019-02-14 MED ORDER — SODIUM CHLORIDE 0.9% FLUSH: 3.0000 mL | INTRAVENOUS | Status: DC | PRN

## 2019-02-14 MED ORDER — SODIUM CHLORIDE 0.9 % IV SOLN: 250.0000 mL | INTRAVENOUS | Status: DC | PRN

## 2019-02-14 MED ORDER — EZETIMIBE 10 MG PO TABS
10.0000 mg | ORAL_TABLET | Freq: Every day | ORAL | Status: DC
  Administered 2019-02-15 – 2019-02-17 (×2): 10 mg via ORAL
  Filled 2019-02-14 (×2): qty 1

## 2019-02-14 MED ORDER — CALCIUM CARBONATE-VITAMIN D 500-200 MG-UNIT PO TABS: 2.0000 | ORAL_TABLET | Freq: Every day | ORAL | Status: DC

## 2019-02-14 MED ORDER — INSULIN ASPART 100 UNIT/ML ~~LOC~~ SOLN
0.0000 [IU] | Freq: Every day | SUBCUTANEOUS | Status: DC
  Administered 2019-02-16: 22:00:00 3 [IU] via SUBCUTANEOUS

## 2019-02-14 MED ORDER — DEXTROMETHORPHAN POLISTIREX ER 30 MG/5ML PO SUER
30.0000 mg | ORAL | Status: DC | PRN
  Filled 2019-02-14: qty 5

## 2019-02-14 MED ORDER — NITROGLYCERIN 2 % TD OINT
0.5000 [in_us] | TOPICAL_OINTMENT | Freq: Four times a day (QID) | TRANSDERMAL | Status: DC
  Administered 2019-02-14 – 2019-02-15 (×3): 0.5 [in_us] via TOPICAL
  Filled 2019-02-14: qty 30

## 2019-02-14 MED ORDER — AMLODIPINE BESYLATE 5 MG PO TABS: 5.0000 mg | ORAL_TABLET | Freq: Every day | ORAL | Status: DC

## 2019-02-14 MED ORDER — DOCUSATE SODIUM 100 MG PO CAPS: 100.0000 mg | ORAL_CAPSULE | Freq: Every day | ORAL | Status: DC | PRN

## 2019-02-14 MED ORDER — MIDODRINE HCL 5 MG PO TABS
10.0000 mg | ORAL_TABLET | ORAL | Status: DC
  Administered 2019-02-17: 12:00:00 10 mg via ORAL
  Filled 2019-02-14 (×2): qty 2

## 2019-02-14 NOTE — ED Triage Notes (Signed)
Pt here from Dialysis  With c/o chest pressure , no n/v or sob , non radiating , pt did all but 30 mins of his dialysis

## 2019-02-14 NOTE — Progress Notes (Signed)
Patient arrived the unit on a stretcher from the ED, assessment completed see flow sheet, placed on tele ccmd notified, patient oriented to room and staff, bed in lowest position call bell within reach will continue to monitor.

## 2019-02-14 NOTE — ED Notes (Signed)
Pt up at bedside without distress at this time.  Denies any needs.

## 2019-02-14 NOTE — Progress Notes (Signed)
ANTICOAGULATION CONSULT NOTE - Initial Consult  Pharmacy Consult:  Heparin Indication: chest pain/ACS  Allergies  Allergen Reactions  . Baclofen Other (See Comments)    Possibly stroke like symptoms  . Iodinated Diagnostic Agents Swelling, Rash and Other (See Comments)    Other Reaction: burning to mouth, swelling of lips  . Lipitor [Atorvastatin] Other (See Comments)    Leg pain  . Metoprolol Other (See Comments)    Headaches     Patient Measurements: Height: 5\' 6"  (167.6 cm) Weight: 206 lb 2.1 oz (93.5 kg) IBW/kg (Calculated) : 63.8 Heparin Dosing Weight: 84 kg  Vital Signs: Temp: 98.4 F (36.9 C) (07/20 1224) Temp Source: Oral (07/20 1224) BP: 90/50 (07/20 1500) Pulse Rate: 72 (07/20 1225)  Labs: Recent Labs    02/14/19 1320  HGB 12.1*  HCT 37.4*  PLT 170  CREATININE 8.91*  TROPONINIHS 289*    Estimated Creatinine Clearance: 9 mL/min (A) (by C-G formula based on SCr of 8.91 mg/dL (H)).   Medical History: Past Medical History:  Diagnosis Date  . Arthritis   . Atrial flutter ([place])    Ablation 2018  . 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 Erectile dysfunction   . ESRD (end stage renal disease) on dialysis ([place])    Pt on HD 2003 >> transplanted in 2009, back on HD in 2016. [name] TTS.   . Essential hypertension   . Gout   . Hernia of abdominal wall   . History of blood transfusion   . History of cardiomyopathy   . History of kidney stones   . History of pneumonia   . Migraine   . Myocardial infarction ([place])    1996  . Pneumonia   . Secondary hyperparathyroidism ([place])   . Type 2 diabetes mellitus ([place])   . Wears glasses      Assessment: 26 YOM with significant cardiac history presented with chest pain.  Pharmacy consulted to dose IV heparin for ACS.  Noted patient has a  history of Aflutter s/p ablation in 2018 and was taken off of Coumadin.  No bleeding documented.  Goal of Therapy:  Heparin level 0.3-0.7 units/ml Monitor platelets by anticoagulation protocol: Yes   Plan:  Heparin 4000 units IV bolus, then Heparin gtt at 1150 units/hr Check 8 hr heparin level Daily heparin level and CBC  Patricie Geeslin D. Mina Marble, PharmD, BCPS, [place] 02/14/2019, 4:15 PM

## 2019-02-14 NOTE — ED Notes (Signed)
ED TO INPATIENT HANDOFF REPORT  ED Nurse Name and Phone #: Celene Squibb RN  S Name/Age/Gender Patrick [name] 63 y.o. male Room/Bed: 034C/034C  Code Status   Code Status: Prior  Home/SNF/Other Home Patient oriented to: self, place, time and situation Is this baseline? Yes   Triage Complete: Triage complete  Chief Complaint CP X65month; Dialysis; Hypotensive  Triage Note Pt here from Dialysis  With c/o chest pressure , no n/v or sob , non radiating , pt did all but 30 mins of his dialysis    Allergies Allergies  Allergen Reactions  . Baclofen Other (See Comments)    Possibly stroke like symptoms  . Iodinated Diagnostic Agents Swelling, Rash and Other (See Comments)    Other Reaction: burning to mouth, swelling of lips  . Lipitor [Atorvastatin] Other (See Comments)    Leg pain  . Metoprolol Other (See Comments)    Headaches     Level of Care/Admitting Diagnosis ED Disposition    ED Disposition Condition [facility]: [place] [100100]  Level of Care: Progressive [102]  Covid Evaluation: Asymptomatic Screening Protocol (No Symptoms)  Diagnosis: ACS (acute coronary syndrome) [facility]) [[id number]]  Admitting Physician: Merton Border Marshal.Browner  Attending Physician: Laren Everts, ALI Marshal.Browner  Estimated length of stay: past midnight tomorrow  Certification:: I certify this patient will need inpatient services for at least 2 midnights  PT Class (Do Not Modify): Inpatient [101]  PT Acc Code (Do Not Modify): Private [1]       B Medical/Surgery History Past Medical History:  Diagnosis Date  . Arthritis   . Atrial flutter ([place])    Ablation 2018  . 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 Erectile dysfunction   . ESRD (end stage renal disease) on dialysis ([place])    Pt on HD  2003 >> transplanted in 2009, back on HD in 2016. [name]TS.   . Essential hypertension   . Gout   . Hernia of abdominal wall   . History of blood transfusion   . History of cardiomyopathy   . History of kidney stones   . History of pneumonia   . Migraine   . Myocardial infarction ([place])    1996  . Pneumonia   . Secondary hyperparathyroidism ([place])   . Type 2 diabetes mellitus ([place])   . Wears glasses    Past Surgical History:  Procedure Laterality Date  . A-FLUTTER ABLATION N/A 09/24/2016   Procedure: A-Flutter Ablation;  Surgeon: Will Meredith Leeds, MD;  Location: [place] [facility];  Service: Cardiovascular;  Laterality: N/A;  . ABDOMINAL AORTOGRAM W/LOWER EXTREMITY N/A 04/13/2017   Procedure: ABDOMINAL AORTOGRAM W/LOWER EXTREMITY;  Surgeon: Conrad Boyd, MD;  Location: [place] [facility];  Service: Cardiovascular;  Laterality: N/A;  Bilater lower extermity  . APPENDECTOMY    . AV FISTULA PLACEMENT Right 09/18/2014   Procedure: INSERTION OF ARTERIOVENOUS (AV) GORE-TEX GRAFT ARM USING 4-7MM  X 45CM STRETCH GORE-TEX VASCULAR GRAFT;  Surgeon: Rosetta Posner, MD;  Location: [place];  Service: Vascular;  Laterality: Right;  . AV FISTULA PLACEMENT Left 07/07/2016   Procedure: INSERTION OF LEFT BRACHIAL TO AXILLARY ARTERIOVENOUS (AV) GORE-TEX ARM GRAFT;  Surgeon: Conrad Hartleton, MD;  Location: [place];  Service: Vascular;  Laterality: Left;  . AV  FISTULA PLACEMENT Left 12/09/2018   Procedure: Insertion Of Arteriovenous (Av) Gore-Tex Graft Arm, left arm;  Surgeon: Marty Heck, MD;  Location: [place];  Service: Vascular;  Laterality: Left;  . CARDIAC CATHETERIZATION  ~ 2016  . COLONOSCOPY    . 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 EXCHANGE OF A DIALYSIS CATHETER Left 01/11/2018   Procedure: EXCHANGE OF TUNNELED DIALYSIS CATHETER;  Surgeon: Rosetta Posner, MD;  Location: [place];  Service: Vascular;  Laterality:  Left;  . HERNIA REPAIR  2017   with nephrectomy  . INSERTION OF DIALYSIS CATHETER N/A 10/08/2017   Procedure: INSERTION OF TUNNELED DIALYSIS CATHETER;  Surgeon: Conrad Bertram, MD;  Location: [place];  Service: Vascular;  Laterality: N/A;  . IR FLUORO GUIDE CV LINE LEFT  03/12/2018  . IR GENERIC HISTORICAL  05/11/2016   IR FLUORO GUIDE CV LINE LEFT 05/11/2016 Corrie Mckusick, DO [facility]  . IR GENERIC HISTORICAL  05/11/2016   IR US GUIDE VASC ACCESS LEFT 05/11/2016 Corrie Mckusick, DO [facility]  . IR GENERIC HISTORICAL  05/11/2016   IR US GUIDE VASC ACCESS RIGHT 05/11/2016 Corrie Mckusick, DO [facility]  . IR GENERIC HISTORICAL  05/11/2016   IR RADIOLOGY PERIPHERAL GUIDED IV START 05/11/2016 Corrie Mckusick, DO [facility]  . KIDNEY TRANSPLANT  2009  . LEFT HEART CATH AND CORS/GRAFTS ANGIOGRAPHY N/A 12/04/2017   Procedure: LEFT HEART CATH AND CORS/GRAFTS ANGIOGRAPHY;  Surgeon: Belva Crome, MD;  Location: [place] [facility];  Service: Cardiovascular;  Laterality: N/A;  . NEPHRECTOMY  2017   transplant rejected   . PERIPHERAL VASCULAR CATHETERIZATION N/A 06/04/2016   Procedure: Upper Extremity Venography;  Surgeon: Waynetta Sandy, MD;  Location: [place] [facility];  Service: Cardiovascular;  Laterality: N/A;  . PERIPHERAL VASCULAR INTERVENTION  04/13/2017   Procedure: PERIPHERAL VASCULAR INTERVENTION;  Surgeon: Conrad [place], MD;  Location: [place] [facility];  Service: Cardiovascular;;  Lt. Common/Exernal  Iliac  . THROMBECTOMY AND REVISION OF ARTERIOVENTOUS (AV) GORETEX  GRAFT Left 10/08/2017   Procedure: THROMBECTOMY of ARTERIOVENTOUS (AV) GORETEX  GRAFT LEFT UPPER ARM;  Surgeon: Conrad Medaryville, MD;  Location: [place];  Service: Vascular;  Laterality: Left;  . THROMBECTOMY W/ EMBOLECTOMY Left 09/14/2017   Procedure: THROMBECTOMY ARTERIOVENOUS GORE-TEX GRAFT LEFT UPPER ARM;  Surgeon: Rosetta Posner, MD;  Location: [place];  Service: Vascular;  Laterality: Left;  . UPPER EXTREMITY  ANGIOGRAPHY Bilateral 10/29/2018   Procedure: UPPER EXTREMITY ANGIOGRAPHY;  Surgeon: Marty Heck, MD;  Location: [place] [facility];  Service: Cardiovascular;  Laterality: Bilateral;  . UPPER EXTREMITY VENOGRAPHY N/A 11/16/2017   Procedure: UPPER EXTREMITY VENOGRAPHY - Right Arm;  Surgeon: Conrad Oilton, MD;  Location: [place] [facility];  Service: Cardiovascular;  Laterality: N/A;  . UPPER EXTREMITY VENOGRAPHY Bilateral 10/29/2018   Procedure: UPPER EXTREMITY VENOGRAPHY;  Surgeon: Marty Heck, MD;  Location: [place] [facility];  Service: Cardiovascular;  Laterality: Bilateral;     A IV Location/Drains/Wounds Patient Lines/Drains/Airways Status   Active Line/Drains/Airways    Name:   Placement date:   Placement time:   Site:   Days:   Peripheral IV 02/14/19 Anterior;Proximal;Right;Upper Arm   02/14/19    1323    Arm   less than 1   Fistula / Graft Left Upper arm Arteriovenous vein graft   10/08/17    1440    Upper  arm   494   Fistula / Graft Right Upper arm Arteriovenous fistula   09/18/14    0819    Upper arm   1610   Fistula / Graft Right Upper arm Arteriovenous fistula   -    -    Upper arm      Fistula / Graft Left Upper arm Arteriovenous vein graft   07/07/16    0901    Upper arm   952   Fistula / Graft Left Upper arm Arteriovenous vein graft   12/09/18    0832    Upper arm   67   Hemodialysis Catheter Left Internal jugular Double-lumen;Permanent   03/12/18    1439    Internal jugular   339   Closed System Drain 1 Right RUQ Bulb (JP) 10 Fr.   10/02/18    1151    RUQ   135   Closed System Drain 2 Right RUQ Bulb (JP) 10 Fr.   10/02/18    1158    RUQ   135   Incision (Closed) 09/18/14 Arm Right   09/18/14    0813     1610   Incision (Closed) 09/18/14 Arm Right   09/18/14    0813     1610   Incision (Closed) 07/07/16 Arm Left   07/07/16    1004     952   Incision (Closed) 09/14/17 Arm Left   09/14/17    1431     518   Incision (Closed) 10/08/17 Arm Left   10/08/17    1437      494   Incision (Closed) 01/11/18 Chest Left   01/11/18    0929     399   Incision (Closed) 12/09/18 Arm Left   12/09/18    0806     67          Intake/Output Last 24 hours No intake or output data in the 24 hours ending 02/14/19 1547  Labs/Imaging Results for orders placed or performed during the hospital encounter of 02/14/19 (from the past 48 hour(s))  Basic metabolic panel     Status: Abnormal   Collection Time: 02/14/19  1:20 PM  Result Value Ref Range   Sodium 135 135 - 145 mmol/L   Potassium 3.7 3.5 - 5.1 mmol/L   Chloride 95 (L) 98 - 111 mmol/L   CO2 25 22 - 32 mmol/L   Glucose, Bld 86 70 - 99 mg/dL   BUN 28 (H) 8 - 23 mg/dL   Creatinine, Ser 8.91 (H) 0.61 - 1.24 mg/dL   Calcium 10.6 (H) 8.9 - 10.3 mg/dL   GFR calc non Af Amer 6 (L) >60 mL/min   GFR calc Af Amer 7 (L) >60 mL/min   Anion gap 15 5 - 15    Comment: Performed at [facility], [address] [place], [place] [address]  Troponin I (High Sensitivity)     Status: Abnormal   Collection Time: 02/14/19  1:20 PM  Result Value Ref Range   Troponin I (High Sensitivity) 289 (HH) <18 ng/L    Comment: CRITICAL RESULT CALLED TO, READ BACK BY AND VERIFIED WITH: M Ruth Kovich RN AT 1421 ON [id number] BY K FORSYTH (NOTE) Elevated high sensitivity troponin I (hsTnI) values and significant  changes across serial measurements may suggest ACS but many other  chronic and acute conditions are known to elevate hsTnI results.  Refer to the Links section for chest pain algorithms and additional  guidance. Performed at [facility], [place] [address], [place], [place] [address]   CBC with Differential     Status: Abnormal   Collection Time: 02/14/19  1:20 PM  Result Value Ref Range   WBC 5.9 4.0 - 10.5 K/uL   RBC 3.97 (L) 4.22 - 5.81 MIL/uL   Hemoglobin 12.1 (L) 13.0 - 17.0 g/dL   HCT 37.4 (L) 39.0 - 52.0 %   MCV 94.2 80.0 - 100.0 fL   MCH 30.5 26.0 - 34.0 pg   MCHC 32.4 30.0 - 36.0 g/dL   RDW 17.5 (H) 11.5 - 15.5 %    Platelets 170 150 - 400 K/uL   nRBC 0.0 0.0 - 0.2 %   Neutrophils Relative % 64 %   Neutro Abs 3.7 1.7 - 7.7 K/uL   Lymphocytes Relative 23 %   Lymphs Abs 1.4 0.7 - 4.0 K/uL   Monocytes Relative 7 %   Monocytes Absolute 0.4 0.1 - 1.0 K/uL   Eosinophils Relative 5 %   Eosinophils Absolute 0.3 0.0 - 0.5 K/uL   Basophils Relative 1 %   Basophils Absolute 0.1 0.0 - 0.1 K/uL   Immature Granulocytes 0 %   Abs Immature Granulocytes 0.01 0.00 - 0.07 K/uL    Comment: Performed at [facility], [address], [place], [place] [address]  SARS Coronavirus 2 (CEPHEID - Performed in [place] hospital lab), Hosp Order     Status: None   Collection Time: 02/14/19  1:52 PM   Specimen: Nasopharyngeal Swab  Result Value Ref Range   SARS Coronavirus 2 NEGATIVE NEGATIVE    Comment: (NOTE) If result is NEGATIVE SARS-CoV-2 target nucleic acids are NOT DETECTED. The SARS-CoV-2 RNA is generally detectable in upper and lower  respiratory specimens during the acute phase of infection. The lowest  concentration of SARS-CoV-2 viral copies this assay can detect is 250  copies / mL. A negative result does not preclude SARS-CoV-2 infection  and should not be used as the sole basis for treatment or other  patient management decisions.  A negative result may occur with  improper specimen collection / handling, submission of specimen other  than nasopharyngeal swab, presence of viral mutation(s) within the  areas targeted by this assay, and inadequate number of viral copies  (<250 copies / mL). A negative result must be combined with clinical  observations, patient history, and epidemiological information. If result is POSITIVE SARS-CoV-2 target nucleic acids are DETECTED. The SARS-CoV-2 RNA is generally detectable in upper and lower  respiratory specimens dur ing the acute phase of infection.  Positive  results are indicative of active infection with SARS-CoV-2.  Clinical  correlation with patient  history and other diagnostic information is  necessary to determine patient infection status.  Positive results do  not rule out bacterial infection or co-infection with other viruses. If result is PRESUMPTIVE POSTIVE SARS-CoV-2 nucleic acids MAY BE PRESENT.   A presumptive positive result was obtained on the submitted specimen  and confirmed on repeat testing.  While 2019 novel coronavirus  (SARS-CoV-2) nucleic acids may be present in the submitted sample  additional confirmatory testing may be necessary for epidemiological  and / or clinical management purposes  to differentiate between  SARS-CoV-2 and other Sarbecovirus currently known to infect humans.  If clinically indicated additional testing with an alternate test  methodology [phone]) is advised. The SARS-CoV-2 RNA is generally  detectable in upper and lower respiratory sp ecimens during the acute  phase  of infection. The expected result is Negative. Fact Sheet for Patients:  StrictlyIdeas.no Fact Sheet for Healthcare Providers: BankingDealers.co.za This test is not yet approved or cleared by the [place] FDA and has been authorized for detection and/or diagnosis of SARS-CoV-2 by FDA under an Emergency Use Authorization (EUA).  This EUA will remain in effect (meaning this test can be used) for the duration of the COVID-19 declaration under Section 564(b)(1) of the Act, 21 U.S.C. section 360bbb-3(b)(1), unless the authorization is terminated or revoked sooner. Performed at [facility], [place] [address], [place], [place] [address]    Dg Chest Portable 1 View  Result Date: 02/14/2019 CLINICAL DATA:  Dialysis.  Chest pressure. EXAM: PORTABLE CHEST 1 VIEW COMPARISON:  10/30/2018. FINDINGS: Left IJ dual-lumen catheter noted with tip over cavoatrial junction. Vascular stent noted over the right upper mediastinum, this appears narrowed. This is unchanged. Prior CABG.  Cardiomegaly with normal pulmonary vascularity. Mild left base atelectasis/infiltrate and small left pleural effusion. IMPRESSION: 1.  Dual-lumen left IJ catheter stable position. 2.  Prior CABG.  Stable cardiomegaly. 3. Mild left base atelectasis/infiltrate and small left pleural effusion. Electronically Signed   By: Marcello Moores  Register   On: 02/14/2019 12:39    Pending Labs [employer] (From admission, onward)    Start     Ordered   Signed and Occupational hygienist morning,   R     Signed and Held   Signed and Held  CBC  Tomorrow morning,   R     Signed and Held          Vitals/Pain Today's Vitals   02/14/19 1345 02/14/19 1430 02/14/19 1445 02/14/19 1500  BP: 111/67 110/70 108/66 (!) 90/50  Pulse:      Resp: 20 14 20 16   Temp:      TempSrc:      SpO2:      PainSc:        Isolation Precautions No active isolations  Medications Medications  aspirin chewable tablet 324 mg (324 mg Oral Not Given 02/14/19 1223)    Mobility walks Low fall risk   Focused Assessments Cardiac Assessment Handoff:    Lab Results  Component Value Date   TROPONINI 0.33 ([place]) 10/30/2018   Lab Results  Component Value Date   DDIMER 3.90 (H) 05/25/2016   Does the Patient currently have chest pain? No     R Recommendations: See Admitting Provider Note  Report given to:   Additional Notes:

## 2019-02-14 NOTE — ED Notes (Signed)
Pt restful at this time without noted "episode" of pain.   No need to medicate with chewable ASA.  Given by EMS en route.

## 2019-02-14 NOTE — ED Provider Notes (Signed)
[facility] EMERGENCY DEPARTMENT Provider Note   CSN: [id number] Arrival date & time: 02/14/19  1147    History   Chief Complaint Chief Complaint  Patient presents with  . Chest Pain    HPI Patrick [name] is a 63 y.o. male.     HPI  63 year old male presents with chest pain.  He has been having progressively worsening chest pain for about a month.  Occurs a couple times a day.  The frequency and duration are worsening.  Last occurred about 3 AM for about an hour.  He took his morning medicines but did not take anything such as nitroglycerin.  Occurred once more during dialysis though this was very brief.  This feels like his angina.  Radiates down his left arm.  He felt short of breath when it occurred and has been having diaphoresis.  Went to dialysis today for an almost full session.  Past Medical History:  Diagnosis Date  . Arthritis   . Atrial flutter ([place])    Ablation 2018  . 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 Erectile dysfunction   . ESRD (end stage renal disease) on dialysis ([place])    Pt on HD 2003 >> transplanted in 2009, back on HD in 2016. [name]TS.   . Essential hypertension   . Gout   . Hernia of abdominal wall   . History of blood transfusion   . History of cardiomyopathy   . History of kidney stones   . History of pneumonia   . Migraine   . Myocardial infarction ([place])    1996  . Pneumonia   . Secondary hyperparathyroidism ([place])   . Type 2 diabetes mellitus ([place])   . Wears glasses     Patient Active Problem List   Diagnosis Date Noted  . ACS (acute coronary syndrome) ([place]) 02/14/2019  . Acute on chronic diastolic (congestive) heart failure ([place]) 10/27/2018  . SOB (shortness of breath) 10/02/2018  . Peritoneal abscess ([place]) 10/01/2018  . Nausea and  vomiting 08/20/2018  . Chronic anemia 05/12/2018  . Hypokalemia 05/12/2018  . Acute combined systolic and diastolic congestive heart failure ([place]) 05/12/2018  . Hyperlipidemia 05/12/2018  . Physical deconditioning 05/12/2018  . Multifocal pneumonia 05/11/2018  . Allergy to IVP dye, subsequent encounter   . CAD (coronary artery disease) 11/27/2017  . Atherosclerosis of native arteries of extremity with intermittent claudication ([place]) 04/03/2017  . PAD (peripheral artery disease) ([place]) 02/02/2017  . Typical atrial flutter ([place]) 09/24/2016  . PAF (paroxysmal atrial fibrillation) ([place]) 05/25/2016  . ESRD (end stage renal disease) ([place]) 05/11/2016  . Anemia of chronic disease   . End-stage renal disease on hemodialysis ([place])   . Renovascular hypertension   . History of MI (myocardial infarction)   . Acute respiratory failure with hypoxia ([place])   . Controlled diabetes mellitus type 2 with complications ([place])   . Pleural effusion   . Symptomatic anemia 01/22/2016  . Acute respiratory failure ([place]) 01/22/2016  . Acute on chronic diastolic CHF (congestive heart failure) ([place]) 01/22/2016  . Elevated troponin 01/22/2016  . Febrile illness 01/22/2016  . Chest pain 11/17/2015  . Normocytic anemia 11/17/2015  . Gynecomastia 11/17/2015  . Renal stone 11/17/2015  . Cough 11/17/2015  . Dyspnea 10/30/2015  . CAP (community acquired pneumonia)   .  HCAP (healthcare-associated pneumonia) 10/11/2015  . Acute respiratory disease 10/11/2015  . Protein-calorie malnutrition, severe ([place]) 09/16/2014  . 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 Metabolic acidosis, NAG, failure of bicarbonate regeneration 09/14/2014  . Anemia in chronic kidney disease 11/24/2013  . 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 Hypocalcemia 11/24/2013  . Low bicarbonate 11/24/2013  . Essential hypertension 03/19/2013  . Acute gout 03/19/2013  . Impotence of organic origin 03/19/2013  . Secondary hyperparathyroidism ([place])  03/19/2013  . End-stage renal failure with renal transplant ([place]) 03/19/2013  . 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 A-FLUTTER ABLATION N/A 09/24/2016   Procedure: A-Flutter Ablation;  Surgeon: Will Meredith Leeds, MD;  Location: [facility];  Service: Cardiovascular;  Laterality: N/A;  . ABDOMINAL AORTOGRAM W/LOWER EXTREMITY N/A 04/13/2017   Procedure: ABDOMINAL AORTOGRAM W/LOWER EXTREMITY;  Surgeon: Conrad Pittsburg, MD;  Location: [facility];  Service: Cardiovascular;  Laterality: N/A;  Bilater lower extermity  . APPENDECTOMY    . AV FISTULA PLACEMENT Right 09/18/2014   Procedure: INSERTION OF ARTERIOVENOUS (AV) GORE-TEX GRAFT ARM USING 4-7MM  X 45CM STRETCH GORE-TEX VASCULAR GRAFT;  Surgeon: Rosetta Posner, MD;  Location: [facility];  Service: Vascular;  Laterality: Right;  . AV FISTULA PLACEMENT Left 07/07/2016   Procedure: INSERTION OF LEFT BRACHIAL TO AXILLARY ARTERIOVENOUS (AV) GORE-TEX ARM GRAFT;  Surgeon: Conrad Revillo, MD;  Location: [facility];  Service: Vascular;  Laterality: Left;  . AV FISTULA PLACEMENT Left 12/09/2018   Procedure: Insertion Of Arteriovenous (Av) Gore-Tex Graft Arm, left arm;  Surgeon: Marty Heck, MD;  Location: [facility];  Service: Vascular;  Laterality: Left;  . CARDIAC CATHETERIZATION  ~ 2016  . COLONOSCOPY    . 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 EXCHANGE OF A DIALYSIS CATHETER Left 01/11/2018   Procedure: EXCHANGE OF TUNNELED DIALYSIS CATHETER;  Surgeon: Rosetta Posner, MD;  Location: [facility];  Service: Vascular;  Laterality: Left;  . HERNIA REPAIR  2017   with nephrectomy  . INSERTION OF DIALYSIS CATHETER N/A 10/08/2017   Procedure: INSERTION OF TUNNELED DIALYSIS CATHETER;  Surgeon: Conrad Dilworth, MD;  Location: [facility];  Service: Vascular;  Laterality: N/A;  . IR FLUORO GUIDE CV  LINE LEFT  03/12/2018  . IR GENERIC HISTORICAL  05/11/2016   IR FLUORO GUIDE CV LINE LEFT 05/11/2016 Corrie Mckusick, DO [facility]  . IR GENERIC HISTORICAL  05/11/2016   IR US GUIDE VASC ACCESS LEFT 05/11/2016 Corrie Mckusick, DO [facility]  . IR GENERIC HISTORICAL  05/11/2016   IR US GUIDE VASC ACCESS RIGHT 05/11/2016 Corrie Mckusick, DO [facility]  . IR GENERIC HISTORICAL  05/11/2016   IR RADIOLOGY PERIPHERAL GUIDED IV START 05/11/2016 Corrie Mckusick, DO [facility]  . KIDNEY TRANSPLANT  2009  . LEFT HEART CATH AND CORS/GRAFTS ANGIOGRAPHY N/A 12/04/2017   Procedure: LEFT HEART CATH AND CORS/GRAFTS ANGIOGRAPHY;  Surgeon: Belva Crome, MD;  Location: [facility];  Service: Cardiovascular;  Laterality: N/A;  . NEPHRECTOMY  2017   transplant rejected   . PERIPHERAL VASCULAR CATHETERIZATION N/A 06/04/2016   Procedure: Upper Extremity Venography;  Surgeon: Waynetta Sandy, MD;  Location: [facility];  Service: Cardiovascular;  Laterality: N/A;  . PERIPHERAL VASCULAR INTERVENTION  04/13/2017   Procedure: PERIPHERAL VASCULAR INTERVENTION;  Surgeon: Conrad Seaside, MD;  Location: [facility];  Service: Cardiovascular;;  Lt. Common/Exernal  Iliac  . THROMBECTOMY AND REVISION OF ARTERIOVENTOUS (AV) GORETEX  GRAFT Left 10/08/2017   Procedure: THROMBECTOMY of ARTERIOVENTOUS (AV) GORETEX  GRAFT LEFT UPPER ARM;  Surgeon: Conrad Seneca, MD;  Location: [facility];  Service: Vascular;  Laterality: Left;  . THROMBECTOMY W/ EMBOLECTOMY Left 09/14/2017   Procedure: THROMBECTOMY ARTERIOVENOUS GORE-TEX GRAFT LEFT UPPER ARM;  Surgeon: Rosetta Posner, MD;  Location: [facility];  Service: Vascular;  Laterality: Left;  . UPPER EXTREMITY ANGIOGRAPHY Bilateral 10/29/2018   Procedure: UPPER EXTREMITY ANGIOGRAPHY;  Surgeon: Marty Heck, MD;  Location: [facility];  Service: Cardiovascular;  Laterality: Bilateral;  . UPPER EXTREMITY VENOGRAPHY N/A 11/16/2017   Procedure: UPPER EXTREMITY  VENOGRAPHY - Right Arm;  Surgeon: Conrad , MD;  Location: [facility];  Service: Cardiovascular;  Laterality: N/A;  . UPPER EXTREMITY VENOGRAPHY Bilateral 10/29/2018   Procedure: UPPER EXTREMITY VENOGRAPHY;  Surgeon: Marty Heck, MD;  Location: [facility];  Service: Cardiovascular;  Laterality: Bilateral;        Home Medications    Prior to Admission medications   Medication Sig Start Date End Date Taking? Authorizing Provider  acetaminophen (TYLENOL) 500 MG tablet Take 1,000 mg by mouth every 6 (six) hours as needed for moderate pain or headache.   Yes [provider]  albuterol (PROVENTIL HFA;VENTOLIN HFA) 108 (90 Base) MCG/ACT inhaler Inhale 1-2 puffs into the lungs every 6 (six) hours as needed for wheezing or shortness of breath. Patient taking differently: Inhale 2 puffs into the lungs every 6 (six) hours as needed for wheezing or shortness of breath.  12/18/17  Yes Weaver, Kaavya Puskarich T, PA-C  amLODipine (NORVASC) 5 MG tablet TAKE 1 TABLET BY MOUTH DAILY Patient taking differently: Take 5 mg by mouth daily.  12/10/18  Yes Belva Crome, MD  aspirin EC 81 MG tablet Take 81 mg by mouth daily.    Yes [provider]  bisoprolol (ZEBETA) 5 MG tablet Take 1 tablet (5 mg total) by mouth daily. 10/06/18  Yes Eugenie Filler, MD  calcium acetate (PHOSLO) 667 MG capsule Take 2,668 mg by mouth daily with supper.    Yes [provider]  Calcium Carbonate-Vitamin D (CALCIUM 500/D PO) Take 3 tablets by mouth daily.    Yes [provider]  clopidogrel (PLAVIX) 75 MG tablet Take 1 tablet (75 mg total) by mouth daily. 12/18/17  Yes Weaver, Shantae Vantol T, PA-C  Colchicine 0.6 MG CAPS Take 0.6 mg by mouth See admin instructions. Take 0.6 mg when first symptoms of gout flare occur, then take another 0.6 mg 14 days later as needed for gout 10/13/18  Yes [provider]  dextromethorphan (DELSYM) 30 MG/5ML liquid Take 30 mg by mouth as needed for  cough.   Yes [provider]  docusate sodium (COLACE) 100 MG capsule Take 100 mg by mouth daily as needed for mild constipation.   Yes [provider]  ezetimibe (ZETIA) 10 MG tablet Take 10 mg by mouth daily.    Yes [provider]  guaiFENesin (MUCINEX) 600 MG 12 hr tablet Take 600 mg by mouth 2 (two) times daily as needed for cough.   Yes [provider]  isosorbide mononitrate (IMDUR) 60 MG 24 hr tablet Take 1 tablet (60 mg total) by mouth daily. 11/08/18 02/14/19 Yes Bhagat, Bhavinkumar, PA  midodrine (PROAMATINE) 10 MG tablet  Take 10 mg by mouth every Monday, Wednesday, and Friday. Dialysis days only 02/07/19  Yes [provider]  multivitamin (RENA-VIT) TABS tablet Take 1 tablet by mouth daily.    Yes [provider]  pantoprazole (PROTONIX) 40 MG tablet Take 1 tablet (40 mg total) by mouth daily. 11/08/18  Yes Bhagat, Bhavinkumar, PA  repaglinide (PRANDIN) 0.5 MG tablet Take 1 tablet (0.5 mg total) by mouth 2 (two) times daily before a meal. 04/05/18  Yes Renato Shin, MD  rosuvastatin (CRESTOR) 10 MG tablet Take 10 mg by mouth daily.    Yes [provider]  oxyCODONE-acetaminophen (PERCOCET/ROXICET) 5-325 MG tablet Take 1 tablet by mouth every 6 (six) hours as needed. 12/09/18   Ulyses Amor, PA-C    Family History Family History  Problem Relation Age of Onset  . Hyperlipidemia Mother   . Hypertension Mother   . HIV Sister     Social History Social History   Tobacco Use  . Smoking status: Former Smoker    Types: Cigarettes    Quit date: 07/04/2014    Years since quitting: 4.6  . Smokeless tobacco: Never Used  . Tobacco comment: "smoked ~ 1 pack/month when I did smoke; never a steady smoker"  Substance Use Topics  . Alcohol use: Yes    Alcohol/week: 0.0 standard drinks    Comment: rare  "2 drinks, 1-2 times/year"  . Drug use: No     Allergies   Baclofen, Iodinated diagnostic agents, Lipitor [atorvastatin], and  Metoprolol   Review of Systems Review of Systems  Constitutional: Positive for diaphoresis.  Respiratory: Positive for shortness of breath.   Cardiovascular: Positive for chest pain.  Gastrointestinal: Negative for vomiting.  All other systems reviewed and are negative.    Physical Exam Updated Vital Signs BP (!) 90/50   Pulse 72   Temp 98.4 F (36.9 C) (Oral)   Resp 16   SpO2 99%   Physical Exam Vitals signs and nursing note reviewed.  Constitutional:      General: He is not in acute distress.    Appearance: He is well-developed. He is not ill-appearing or diaphoretic.  HENT:     Head: Normocephalic and atraumatic.     Right Ear: External ear normal.     Left Ear: External ear normal.     Nose: Nose normal.  Eyes:     General:        Right eye: No discharge.        Left eye: No discharge.  Neck:     Musculoskeletal: Neck supple.  Cardiovascular:     Rate and Rhythm: Normal rate and regular rhythm.     Heart sounds: Normal heart sounds.  Pulmonary:     Effort: Pulmonary effort is normal.     Breath sounds: Normal breath sounds.  Chest:     Chest wall: Tenderness (has chronic pain in left breast from swelling, states this tenderness is not same as his chest pressure) present.  Abdominal:     Palpations: Abdomen is soft.     Tenderness: There is no abdominal tenderness.  Skin:    General: Skin is warm and dry.  Neurological:     Mental Status: He is alert.  Psychiatric:        Mood and Affect: Mood is not anxious.      ED Treatments / Results  Labs (all labs ordered are listed, but only abnormal results are displayed) Labs Reviewed  BASIC METABOLIC PANEL - Abnormal;  Notable for the following components:      Result Value   Chloride 95 (*)    BUN 28 (*)    Creatinine, Ser 8.91 (*)    Calcium 10.6 (*)    GFR calc non Af Amer 6 (*)    GFR calc Af Amer 7 (*)    All other components within normal limits  CBC WITH DIFFERENTIAL/PLATELET - Abnormal;  Notable for the following components:   RBC 3.97 (*)    Hemoglobin 12.1 (*)    HCT 37.4 (*)    RDW 17.5 (*)    All other components within normal limits  TROPONIN I (HIGH SENSITIVITY) - Abnormal; Notable for the following components:   Troponin I (High Sensitivity) 289 (*)    All other components within normal limits  SARS CORONAVIRUS 2 (HOSPITAL ORDER, [facility])  TROPONIN I (HIGH SENSITIVITY)    EKG EKG Interpretation  Date/Time:  Monday February 14 2019 11:56:34 EDT Ventricular Rate:  85 PR Interval:    QRS Duration: 99 QT Interval:  382 QTC Calculation: 455 R Axis:   18 Text Interpretation:  Sinus or ectopic atrial rhythm Multiple premature complexes, vent & supraven Prolonged PR interval Abnormal T, consider ischemia, lateral leads T wave changes similar to Apr 2020 Confirmed by Sherwood Gambler [phone]) on 02/14/2019 12:00:48 PM   Radiology Dg Chest Portable 1 View  Result Date: 02/14/2019 CLINICAL DATA:  Dialysis.  Chest pressure. EXAM: PORTABLE CHEST 1 VIEW COMPARISON:  10/30/2018. FINDINGS: Left IJ dual-lumen catheter noted with tip over cavoatrial junction. Vascular stent noted over the right upper mediastinum, this appears narrowed. This is unchanged. Prior CABG. Cardiomegaly with normal pulmonary vascularity. Mild left base atelectasis/infiltrate and small left pleural effusion. IMPRESSION: 1.  Dual-lumen left IJ catheter stable position. 2.  Prior CABG.  Stable cardiomegaly. 3. Mild left base atelectasis/infiltrate and small left pleural effusion. Electronically Signed   By: Marcello Moores  Register   On: 02/14/2019 12:39    Procedures Procedures (including critical care time)  Medications Ordered in ED Medications  aspirin chewable tablet 324 mg (324 mg Oral Not Given 02/14/19 1223)     Initial Impression / Assessment and Plan / ED Course  I have reviewed the triage vital signs and the nursing notes.  Pertinent labs & imaging results that were  available during my care of the patient were reviewed by me and considered in my medical decision making (see chart for details).        Patient has no chest pain at this time.  He he describes what is probably unstable angina worsening over several weeks.  His troponin is elevated though no baseline with the high-sensitivity to compare to.  Probably somewhat related to his renal dysfunction but will need serial results to tell.  I discussed with Dr. Harrell Gave, cardiology, who agrees with no heparin at this time and medical admission.  They will consult. Dr. Laren Everts to admit.  Final Clinical Impressions(s) / ED Diagnoses   Final diagnoses:  Unstable angina [facility])    ED Discharge Orders    None       Sherwood Gambler, MD 02/14/19 1528

## 2019-02-14 NOTE — ED Notes (Addendum)
Attempted to call report to [phone].  Pt needs reassigned and they are contacting [facility].  Will have bed reassigned.  Admitting MD has visited.  Delay explained to pt, with no concerns or needs at this time.

## 2019-02-14 NOTE — Consult Note (Addendum)
Cardiology Consultation:   Patient ID: Patrick [name] MRN: [id number]; DOB: [birth year]  Admit date: 02/14/2019 Date of Consult: 02/14/2019  Primary Care Provider: Nolene Ebbs, MD Primary Cardiologist: Dorris Carnes, MD  Primary Electrophysiologist:  Constance Haw, MD    Patient Profile:   Patrick [name] is a 63 y.o. 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 Regenia Skeeter.   Last Cath May 2019 with angina atretic LIMA-LAD and an occluded LAD with left to left and left to right collaterals. He had moderate nonobstructive disease elsewhere. It was felt that his angina was related to his occluded LAD. Discussion of CTO was undertaken but would be very difficult so medical therapy has been used.   Admitted 09/2018 with abdominal abscess. Admitted 10/2018 with weakness and SOB. Found to have hypokalemia at 2.8. troponin peaked at 1.13, felt demand in setting of renal insufficiency. Recommended medical therapy with stress test in 8 weeks. However presented to ER next day after discharge for chest pain. Noted increased T wave inversions laterally. Troponin 0.33 which down from prior admission. Seen by Dr. Stanford Breed who recommended cardiac cath and admission but patient declined both (see consult note 10/30/18). Continued ASA, Plavix, BB, statin and amlodipine. Added Imdur 30mg  daily.   Last seen by me Virtually 11/08/2018. Chest pain improved but never resolved. Increased Imdur to 60mg  daily.    History of Present Illness:   Patrick [name] did well with higher dose of Imdur up until 4 weeks ago when he started to having recurrent angina.  Initially he had pain with exertion walking around 50 feet now present at rest.  Symptoms occurs multiple times per week.   Associated with shortness of breath and " 90% of time radiates to his left shoulder".  Usually last for 30 minutes or so.  He had worse episode last night, waking up from sleep.  Lasted for at least 1 hour.  This morning he had a mild discomfort during dialysis.  He completed a full session.  After completion he is chest pressure worsen and directly to ER for further evaluation.  Currently chest pain-free.  Denies orthopnea, PND, syncope, palpitation, lower extremity edema or melena.  No exposure to COVID-19.  Reports compliant with medication.   Hs-troponin 289. Scr 8.91. Hgb 12.1. COVID pending. CXR with small L pleural effusion.   Heart Pathway Score:     Past Medical History:  Diagnosis Date  . Arthritis   . Atrial flutter ([place])    Ablation 2018  . 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 Erectile dysfunction   . ESRD (end stage renal disease) on dialysis ([place])    Pt on HD 2003 >> transplanted in 2009, back on HD in 2016. [name].   . Essential hypertension   . Gout   . Hernia of abdominal wall   . History of blood transfusion   . History of cardiomyopathy   . History of kidney stones   . History of pneumonia   . Migraine   . Myocardial infarction ([place])    1996  .  Pneumonia   . Secondary hyperparathyroidism ([place])   . Type 2 diabetes mellitus ([place])   . Wears glasses     Past Surgical History:  Procedure Laterality Date  . A-FLUTTER ABLATION N/A 09/24/2016   Procedure: A-Flutter Ablation;  Surgeon: Will Meredith Leeds, MD;  Location: [facility];  Service: Cardiovascular;  Laterality: N/A;  . ABDOMINAL AORTOGRAM W/LOWER EXTREMITY N/A 04/13/2017   Procedure: ABDOMINAL AORTOGRAM W/LOWER EXTREMITY;  Surgeon: Conrad Cementon, MD;  Location: [facility];  Service: Cardiovascular;  Laterality: N/A;   Bilater lower extermity  . APPENDECTOMY    . AV FISTULA PLACEMENT Right 09/18/2014   Procedure: INSERTION OF ARTERIOVENOUS (AV) GORE-TEX GRAFT ARM USING 4-7MM  X 45CM STRETCH GORE-TEX VASCULAR GRAFT;  Surgeon: Rosetta Posner, MD;  Location: [facility];  Service: Vascular;  Laterality: Right;  . AV FISTULA PLACEMENT Left 07/07/2016   Procedure: INSERTION OF LEFT BRACHIAL TO AXILLARY ARTERIOVENOUS (AV) GORE-TEX ARM GRAFT;  Surgeon: Conrad [place], MD;  Location: [facility];  Service: Vascular;  Laterality: Left;  . AV FISTULA PLACEMENT Left 12/09/2018   Procedure: Insertion Of Arteriovenous (Av) Gore-Tex Graft Arm, left arm;  Surgeon: Marty Heck, MD;  Location: [facility];  Service: Vascular;  Laterality: Left;  . CARDIAC CATHETERIZATION  ~ 2016  . COLONOSCOPY    . 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 EXCHANGE OF A DIALYSIS CATHETER Left 01/11/2018   Procedure: EXCHANGE OF TUNNELED DIALYSIS CATHETER;  Surgeon: Rosetta Posner, MD;  Location: [facility];  Service: Vascular;  Laterality: Left;  . HERNIA REPAIR  2017   with nephrectomy  . INSERTION OF DIALYSIS CATHETER N/A 10/08/2017   Procedure: INSERTION OF TUNNELED DIALYSIS CATHETER;  Surgeon: Conrad [place]s, MD;  Location: [facility];  Service: Vascular;  Laterality: N/A;  . IR FLUORO GUIDE CV LINE LEFT  03/12/2018  . IR GENERIC HISTORICAL  05/11/2016   IR FLUORO GUIDE CV LINE LEFT 05/11/2016 Corrie Mckusick, DO [facility]  . IR GENERIC HISTORICAL  05/11/2016   IR US GUIDE VASC ACCESS LEFT 05/11/2016 Corrie Mckusick, DO [facility]  . IR GENERIC HISTORICAL  05/11/2016   IR US GUIDE VASC ACCESS RIGHT 05/11/2016 Corrie Mckusick, DO [facility]  . IR GENERIC HISTORICAL  05/11/2016   IR RADIOLOGY PERIPHERAL GUIDED IV START 05/11/2016 Corrie Mckusick, DO [facility]  . KIDNEY TRANSPLANT  2009  . LEFT HEART CATH AND CORS/GRAFTS ANGIOGRAPHY N/A 12/04/2017   Procedure: LEFT HEART CATH AND  CORS/GRAFTS ANGIOGRAPHY;  Surgeon: Belva Crome, MD;  Location: [facility];  Service: Cardiovascular;  Laterality: N/A;  . NEPHRECTOMY  2017   transplant rejected   . PERIPHERAL VASCULAR CATHETERIZATION N/A 06/04/2016   Procedure: Upper Extremity Venography;  Surgeon: Waynetta Sandy, MD;  Location: [facility];  Service: Cardiovascular;  Laterality: N/A;  . PERIPHERAL VASCULAR INTERVENTION  04/13/2017   Procedure: PERIPHERAL VASCULAR INTERVENTION;  Surgeon: Conrad Cement, MD;  Location: [facility];  Service: Cardiovascular;;  Lt. Common/Exernal  Iliac  . THROMBECTOMY AND REVISION OF ARTERIOVENTOUS (AV) GORETEX  GRAFT Left 10/08/2017   Procedure: THROMBECTOMY of ARTERIOVENTOUS (AV) GORETEX  GRAFT LEFT UPPER ARM;  Surgeon: Conrad Powell, MD;  Location: [facility];  Service: Vascular;  Laterality: Left;  . THROMBECTOMY W/ EMBOLECTOMY Left 09/14/2017   Procedure: THROMBECTOMY ARTERIOVENOUS GORE-TEX GRAFT LEFT UPPER ARM;  Surgeon: Rosetta Posner,  MD;  Location: [facility];  Service: Vascular;  Laterality: Left;  . UPPER EXTREMITY ANGIOGRAPHY Bilateral 10/29/2018   Procedure: UPPER EXTREMITY ANGIOGRAPHY;  Surgeon: Marty Heck, MD;  Location: [facility];  Service: Cardiovascular;  Laterality: Bilateral;  . UPPER EXTREMITY VENOGRAPHY N/A 11/16/2017   Procedure: UPPER EXTREMITY VENOGRAPHY - Right Arm;  Surgeon: Conrad Clare, MD;  Location: [facility];  Service: Cardiovascular;  Laterality: N/A;  . UPPER EXTREMITY VENOGRAPHY Bilateral 10/29/2018   Procedure: UPPER EXTREMITY VENOGRAPHY;  Surgeon: Marty Heck, MD;  Location: [facility];  Service: Cardiovascular;  Laterality: Bilateral;     Inpatient Medications: Scheduled Meds: . aspirin  324 mg Oral Once   Continuous Infusions:  PRN Meds:   Allergies:    Allergies  Allergen Reactions  . Baclofen Other (See Comments)    Possibly stroke like symptoms  . Iodinated Diagnostic Agents  Swelling, Rash and Other (See Comments)    Other Reaction: burning to mouth, swelling of lips  . Lipitor [Atorvastatin] Other (See Comments)    Leg pain  . Metoprolol Other (See Comments)    Headaches     Social History:   Social History   Socioeconomic History  . Marital status: Married    Spouse name: SHEILA  . Number of children: 5  . Years of education: 6  . Highest education level: 12th grade  Occupational History  . Occupation: DISABLED  Social Needs  . Financial resource strain: Not on file  . Food insecurity    Worry: Never true    Inability: Never true  . Transportation needs    Medical: No    Non-medical: No  Tobacco Use  . Smoking status: Former Smoker    Types: Cigarettes    Quit date: 07/04/2014    Years since quitting: 4.6  . Smokeless tobacco: Never Used  . Tobacco comment: "smoked ~ 1 pack/month when I did smoke; never a steady smoker"  Substance and Sexual Activity  . Alcohol use: Yes    Alcohol/week: 0.0 standard drinks    Comment: rare  "2 drinks, 1-2 times/year"  . Drug use: No  . Sexual activity: Not Currently    Birth control/protection: None  Lifestyle  . Physical activity    Days per week: 0 days    Minutes per session: 0 min  . Stress: Not at all  Relationships  . Social connections    Talks on phone: More than three times a week    Gets together: More than three times a week    Attends religious service: Never    Active member of club or organization: No    Attends meetings of clubs or organizations: Never    Relationship status: Married  . Intimate partner violence    Fear of current or ex partner: No    Emotionally abused: No    Physically abused: No    Forced sexual activity: No  Other Topics Concern  . Not on file  Social History Narrative  . Not on file    Family History:   Family History  Problem Relation Age of Onset  . Hyperlipidemia Mother   . Hypertension Mother   . HIV Sister      ROS:  Please see the  history of present illness.  All other ROS reviewed and negative.     Physical Exam/Data:   Vitals:   02/14/19 1225 02/14/19 1230 02/14/19 1300 02/14/19 1345  BP:  (!) 94/59 106/68  111/67  Pulse: 72     Resp:  18 19 20   Temp:      TempSrc:      SpO2:       No intake or output data in the 24 hours ending 02/14/19 1510 Last 3 Weights 12/09/2018 11/08/2018 10/30/2018  Weight (lbs) 208 lb 208 lb 205 lb  Weight (kg) 94.348 kg 94.348 kg 92.987 kg     There is no height or weight on file to calculate BMI.  General:  Well nourished, well developed, in no acute distress HEENT: normal Lymph: no adenopathy Neck: no JVD Endocrine:  No thryomegaly Vascular: No carotid bruits; FA pulses 2+ bilaterally without bruits  Cardiac:  normal S1, S2; RRR; no murmur Lungs:  clear to auscultation bilaterally, no wheezing, rhonchi or rales  Abd: soft, nontender, no hepatomegaly  Ext: no edema Musculoskeletal:  No deformities, BUE and BLE strength normal and equal Skin: warm and dry  Neuro:  CNs 2-12 intact, no focal abnormalities noted Psych:  Normal affect   EKG:  The EKG was personally reviewed and demonstrates: Sinus rhythm at rate of 85 bpm, PAC, PVC, chronic T wave inversion laterally Telemetry:  Telemetry was personally reviewed and demonstrates: Sinus rhythm with frequent PAC and few PVC  Relevant CV Studies:  Echo 08/20/2018 Study Conclusions  - Left ventricle: The cavity size was normal. Wall thickness was increased in a pattern of mild LVH. Systolic function was normal. The estimated ejection fraction was in the range of 50% to 55%. Wall motion was normal; there were no regional wall motion abnormalities. Doppler parameters are consistent with abnormal left ventricular relaxation (grade 1 diastolic dysfunction). Doppler parameters are consistent with high ventricular filling pressure. - Aortic valve: There was mild stenosis. There was trivial regurgitation. - Mitral  valve: Severely calcified annulus.  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  Total occlusion of the proximal to mid LAD filling late by left to left and right to left collaterals.First diagonal contains 50% proximal narrowing.  Left main trunk is normal.  Circumflex gives origin to 3 obtuse marginal branches. The first obtuse marginal contains 50% proximal narrowing.  Right coronary artery is dominant giving PDA and left ventricular branch. Diffuse calcified 60% stenosis is noted throughout the mid vessel ending in the distal segment. No high-grade focal obstruction is seen.  Left ventriculogram and LV pressures were not recorded.  RECOMMENDATIONS:   Angina is related to total occlusion of the proximal to mid LAD. LIMA to the LAD is atretic. 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  Discussed with patient.     Laboratory Data:  High Sensitivity Troponin:   Recent Labs  Lab 02/14/19 1320  TROPONINIHS 289*      Chemistry Recent Labs  Lab 02/14/19 1320  NA 135  K 3.7  CL 95*  CO2 25  GLUCOSE 86  BUN 28*  CREATININE 8.91*  CALCIUM 10.6*  GFRNONAA 6*  GFRAA 7*  ANIONGAP 15    Hematology Recent Labs  Lab 02/14/19 1320  WBC 5.9  RBC 3.97*  HGB 12.1*  HCT 37.4*  MCV 94.2  MCH 30.5  MCHC 32.4  RDW 17.5*  PLT 170  Radiology/Studies:  Dg Chest Portable 1 View  Result Date: 02/14/2019 CLINICAL DATA:  Dialysis.  Chest pressure. EXAM: PORTABLE CHEST 1 VIEW COMPARISON:  10/30/2018. FINDINGS: Left IJ dual-lumen catheter noted with tip over cavoatrial junction. Vascular stent noted over the right upper mediastinum, this appears narrowed.  This is unchanged. Prior CABG. Cardiomegaly with normal pulmonary vascularity. Mild left base atelectasis/infiltrate and small left pleural effusion. IMPRESSION: 1.  Dual-lumen left IJ catheter stable position. 2.  Prior CABG.  Stable cardiomegaly. 3. Mild left base atelectasis/infiltrate and small left pleural effusion. Electronically Signed   By: Marcello Moores  Register   On: 02/14/2019 12:39    Assessment and Plan:   1. Unstable angina with CAD - Last cath May 2019 with angina atretic LIMA-LAD and an occluded LAD with left to left and left to right collaterals. Medical therapy.  -Patient had improved anginal symptoms after addition and up titration of Imdur April 2020.  However, for past 4 weeks he had recurrent angina, now occurring at rest.  Noted worsening in intensity and frequency.  Worse episode last night.  Did not try sublingual nitroglycerin.  Compliant with his medication. -Hs-troponin 289. EKG without acute ischemic changes. Cycle troponin.  -Reviewed  with Dr. [name]. Start heparin. Cath tomorrow.  - Continue ASA, Plavix, statin, Imdur and BB.   2.  Hypertension -Blood pressure soft today. Hold amlodipine. Continue Bisoprolol as long as blood pressure allows.   3. ESRD ON HD -Reports complete session today.  4. HLD  - 08/20/2018: Cholesterol 176; HDL 45; LDL Cholesterol 95; Triglycerides 179; VLDL 36 LDL goal less than 70.  - Increase Crestor  - hx of intolerance to Lipitor. May need to consider PCSK9 inhibitor as outpatient.   For questions or updates, please contact [place] Please consult [email or web address] for contact info under     Signed, Leanor Kail, PA  02/14/2019 3:10 PM   Pt seen and examined   I agree with findings as noted above by B Bhagat  Pt is a 63 yo with known CAD, CHF, ESRD (on HD), HTN, HL and DM)   Last cath in 2019 as noted above     Admitted earlier this spring with chest pain and mild elevation in troponin.  Cath recommended   Pt declined    Meds  were intensified.    The pt presents today with worsening chest pains now at rest  None now.  ON exam, he is comfortable and pain free Neck:   JVP is normal Lungs are CTA   No wheezes   No rales Cardiac RRR  No S3    Abdi is benign Ext are without edema  Given above symptoms at rest would recomm L heart cath to redefine anatomy.   Pt agrees to proceed at this point Tonight will start IV heparin.   Continue other meds    Dorris Carnes MD

## 2019-02-14 NOTE — Telephone Encounter (Signed)
Received call transferred to triage. Pt at HD center receiving dialysis treatment. C/o chest pain, radiating to left shoulder and arm. His current BP is 80/40.  They called to make the patient an appointment today or tomorrow with Dr. [name] or an APP. I recommend this patient who has a significant history of CAD be evaluated at the ER for his symptoms. I did schedule a virtual visit for him for Wednesday afternoon, after his next treatment.   Reviewed with Dr. Marlou Porch (DOD) who is in agreement with this plan.

## 2019-02-14 NOTE — H&P (Signed)
[facility]                                                                                    Patient Demographics  Demarri [name], is a 63 y.o. male  CSN: [id number]  MRN: [id number]  DOB - 1955/09/17  Admit Date - 02/14/2019  Outpatient Primary MD for the patient is Patrick Ebbs, MD   With History of -  Past Medical History:  Diagnosis Date  . Arthritis   . Atrial flutter ([place])    Ablation 2018  . 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 Erectile dysfunction   . ESRD (end stage renal disease) on dialysis ([place])    Pt on HD 2003 >> transplanted in 2009, back on HD in 2016. [name]TS.   . Essential hypertension   . Gout   . Hernia of abdominal wall   . History of blood transfusion   . History of cardiomyopathy   . History of kidney stones   . History of pneumonia   . Migraine   . Myocardial infarction ([place])    1996  . Pneumonia   . Secondary hyperparathyroidism ([place])   . Type 2 diabetes mellitus ([place])   . Wears glasses       Past Surgical History:  Procedure Laterality Date  . A-FLUTTER ABLATION N/A 09/24/2016   Procedure: A-Flutter Ablation;  Surgeon: Will Meredith Leeds, MD;  Location: [place] [facility];  Service: Cardiovascular;  Laterality: N/A;  . ABDOMINAL AORTOGRAM W/LOWER EXTREMITY N/A 04/13/2017   Procedure: ABDOMINAL AORTOGRAM W/LOWER EXTREMITY;  Surgeon: Conrad [place], MD;  Location: [place] [facility];  Service: Cardiovascular;  Laterality: N/A;  Bilater lower extermity  . APPENDECTOMY    . AV FISTULA PLACEMENT Right 09/18/2014   Procedure: INSERTION OF ARTERIOVENOUS (AV) GORE-TEX GRAFT ARM USING 4-7MM  X 45CM STRETCH GORE-TEX VASCULAR GRAFT;  Surgeon: Rosetta Posner, MD;  Location: [place];  Service: Vascular;  Laterality: Right;  . AV FISTULA PLACEMENT Left 07/07/2016    Procedure: INSERTION OF LEFT BRACHIAL TO AXILLARY ARTERIOVENOUS (AV) GORE-TEX ARM GRAFT;  Surgeon: Conrad [place], MD;  Location: [place];  Service: Vascular;  Laterality: Left;  . AV FISTULA PLACEMENT Left 12/09/2018   Procedure: Insertion Of Arteriovenous (Av) Gore-Tex Graft Arm, left arm;  Surgeon: Marty Heck, MD;  Location: [place];  Service: Vascular;  Laterality: Left;  . CARDIAC CATHETERIZATION  ~ 2016  . COLONOSCOPY    . 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 EXCHANGE OF A DIALYSIS CATHETER Left 01/11/2018   Procedure: EXCHANGE OF TUNNELED DIALYSIS CATHETER;  Surgeon: Rosetta Posner, MD;  Location: [place];  Service: Vascular;  Laterality: Left;  . HERNIA REPAIR  2017   with nephrectomy  . INSERTION OF DIALYSIS CATHETER N/A 10/08/2017   Procedure: INSERTION OF TUNNELED DIALYSIS CATHETER;  Surgeon: Conrad [place], MD;  Location: [place];  Service: Vascular;  Laterality: N/A;  . IR FLUORO GUIDE CV LINE LEFT  03/12/2018  . IR GENERIC HISTORICAL  05/11/2016   IR FLUORO GUIDE CV LINE LEFT 05/11/2016 Corrie Mckusick, DO [facility]  . IR GENERIC HISTORICAL  05/11/2016   IR US GUIDE VASC ACCESS LEFT 05/11/2016 Corrie Mckusick, DO [facility]  . IR GENERIC HISTORICAL  05/11/2016   IR US GUIDE VASC ACCESS RIGHT 05/11/2016 Corrie Mckusick, DO [facility]  . IR GENERIC HISTORICAL  05/11/2016   IR RADIOLOGY PERIPHERAL GUIDED IV START 05/11/2016 Corrie Mckusick, DO [facility]  . KIDNEY TRANSPLANT  2009  . LEFT HEART CATH AND CORS/GRAFTS ANGIOGRAPHY N/A 12/04/2017   Procedure: LEFT HEART CATH AND CORS/GRAFTS ANGIOGRAPHY;  Surgeon: Belva Crome, MD;  Location: [place] [facility];  Service: Cardiovascular;  Laterality: N/A;  . NEPHRECTOMY  2017   transplant rejected   . PERIPHERAL VASCULAR CATHETERIZATION N/A 06/04/2016   Procedure: Upper Extremity Venography;  Surgeon: Waynetta Sandy, MD;  Location: [place] [facility];  Service: Cardiovascular;  Laterality: N/A;  . PERIPHERAL VASCULAR INTERVENTION  04/13/2017   Procedure: PERIPHERAL VASCULAR INTERVENTION;  Surgeon: Conrad [place], MD;  Location: [place] [facility];  Service: Cardiovascular;;  Lt. Common/Exernal  Iliac  . THROMBECTOMY AND REVISION OF ARTERIOVENTOUS (AV) GORETEX  GRAFT Left 10/08/2017   Procedure: THROMBECTOMY of ARTERIOVENTOUS (AV) GORETEX  GRAFT LEFT UPPER ARM;  Surgeon: Conrad Jessie, MD;  Location: [place];  Service: Vascular;  Laterality: Left;  . THROMBECTOMY W/ EMBOLECTOMY Left 09/14/2017   Procedure: THROMBECTOMY ARTERIOVENOUS GORE-TEX GRAFT LEFT UPPER ARM;  Surgeon: Rosetta Posner, MD;  Location: [place];  Service: Vascular;  Laterality: Left;  . UPPER EXTREMITY ANGIOGRAPHY Bilateral 10/29/2018   Procedure: UPPER EXTREMITY ANGIOGRAPHY;  Surgeon: Marty Heck, MD;  Location: [place] [facility];  Service: Cardiovascular;  Laterality: Bilateral;  . UPPER EXTREMITY VENOGRAPHY N/A 11/16/2017   Procedure: UPPER EXTREMITY VENOGRAPHY - Right Arm;  Surgeon: Conrad Applegate, MD;  Location: [place] [facility];  Service: Cardiovascular;  Laterality: N/A;  . UPPER EXTREMITY VENOGRAPHY Bilateral 10/29/2018   Procedure: UPPER EXTREMITY VENOGRAPHY;  Surgeon: Marty Heck, MD;  Location: [place] [facility];  Service: Cardiovascular;  Laterality: Bilateral;    in for   Chief Complaint  Patient presents with  . Chest Pain     HPI  Patrick [name]  is a 63 y.o. male, with past medical history significant for coronary artery disease status post CABG in the past, end-stage renal disease on hemodialysis, diabetes mellitus type 2 and diastolic congestive heart failure presenting today with an episode of chest pain that started at 3 AM associated with diaphoresis and mild shortness of breath.  Patient denies any nausea or vomiting.  Chest pain was pressure-like radiating to the left arm and lasted for around 1 hour.  Patient went back to sleep and he went  for his hemodialysis today and mention it to the dialysis physician who advised him to come to the emergency room for evaluation In the emergency room his troponin was elevated at 289 and his EKG showed lateral ischemia. Cardiology was consulted by ER physician and they will follow the patient.    Review of Systems    In addition to the HPI above,  No Fever-chills, No Headache, No changes with Vision or hearing, No problems swallowing food or Liquids, No Abdominal pain, No Nausea or Vommitting, Bowel movements are regular, No Blood in stool or Urine, No dysuria,  No new skin rashes or bruises, No new joints pains-aches,  No new weakness, tingling, numbness in any extremity, No recent weight gain or loss, No polyuria, polydypsia or polyphagia, No significant Mental Stressors.  .   Social History Social History   Tobacco Use  . Smoking status: Former Smoker    Types: Cigarettes    Quit date: 07/04/2014    Years since quitting: 4.6  . Smokeless tobacco: Never Used  . Tobacco comment: "smoked ~ 1 pack/month when I did smoke; never a steady smoker"  Substance Use Topics  . Alcohol use: Yes    Alcohol/week: 0.0 standard drinks    Comment: rare  "2 drinks, 1-2 times/year"     Family History Family History  Problem Relation Age of Onset  . Hyperlipidemia Mother   . Hypertension Mother   . HIV Sister      Prior to Admission medications   Medication Sig Start Date End Date Taking? Authorizing Provider  acetaminophen (TYLENOL) 500 MG tablet Take 1,000 mg by mouth every 6 (six) hours as needed for moderate pain or headache.   Yes [provider]  albuterol (PROVENTIL HFA;VENTOLIN HFA) 108 (90 Base) MCG/ACT inhaler Inhale 1-2 puffs into the lungs every 6 (six) hours as needed for wheezing or shortness of breath. Patient taking differently: Inhale 2 puffs into the lungs every 6 (six) hours as needed for wheezing or shortness of breath.  12/18/17  Yes Weaver, Scott T,  PA-C  amLODipine (NORVASC) 5 MG tablet TAKE 1 TABLET BY MOUTH DAILY Patient taking differently: Take 5 mg by mouth daily.  12/10/18  Yes Belva Crome, MD  aspirin EC 81 MG tablet Take 81 mg by mouth daily.    Yes [provider]  bisoprolol (ZEBETA) 5 MG tablet Take 1 tablet (5 mg total) by mouth daily. 10/06/18  Yes Eugenie Filler, MD  calcium acetate (PHOSLO) 667 MG capsule Take 2,668 mg by mouth daily with supper.    Yes [provider]  Calcium Carbonate-Vitamin D (CALCIUM 500/D PO) Take 3 tablets by mouth daily.    Yes [provider]  clopidogrel (PLAVIX) 75 MG tablet Take 1 tablet (75 mg total) by mouth daily. 12/18/17  Yes Weaver, Scott T, PA-C  Colchicine 0.6 MG CAPS Take 0.6 mg by mouth See admin instructions. Take 0.6 mg when first symptoms of gout flare occur, then take another 0.6 mg 14 days later as needed for gout 10/13/18  Yes [provider]  dextromethorphan (DELSYM) 30 MG/5ML liquid Take 30 mg by mouth as needed for cough.   Yes [provider]  docusate sodium (COLACE) 100 MG capsule Take 100 mg by mouth daily as needed for mild constipation.   Yes [provider]  ezetimibe (ZETIA) 10 MG tablet Take 10 mg by mouth daily.    Yes [provider]  guaiFENesin (MUCINEX) 600 MG 12 hr tablet Take 600 mg by mouth 2 (two) times daily as needed for cough.   Yes [provider]  isosorbide mononitrate (IMDUR) 60 MG 24 hr tablet Take 1 tablet (60 mg total) by mouth daily. 11/08/18 02/14/19 Yes Bhagat, Crista Luria, PA  midodrine (PROAMATINE) 10 MG tablet Take 10 mg by mouth every Monday, Wednesday, and Friday. Dialysis days only 02/07/19  Yes [provider]  multivitamin (RENA-VIT) TABS tablet Take 1 tablet by mouth daily.    Yes [provider]  pantoprazole (PROTONIX) 40 MG tablet Take 1 tablet (40 mg total) by mouth daily.  11/08/18  Yes Bhagat, Bhavinkumar, PA  repaglinide (PRANDIN) 0.5 MG tablet  Take 1 tablet (0.5 mg total) by mouth 2 (two) times daily before a meal. 04/05/18  Yes Renato Shin, MD  rosuvastatin (CRESTOR) 10 MG tablet Take 10 mg by mouth daily.    Yes [provider]  oxyCODONE-acetaminophen (PERCOCET/ROXICET) 5-325 MG tablet Take 1 tablet by mouth every 6 (six) hours as needed. 12/09/18   Ulyses Amor, PA-C    Allergies  Allergen Reactions  . Baclofen Other (See Comments)    Possibly stroke like symptoms  . Iodinated Diagnostic Agents Swelling, Rash and Other (See Comments)    Other Reaction: burning to mouth, swelling of lips  . Lipitor [Atorvastatin] Other (See Comments)    Leg pain  . Metoprolol Other (See Comments)    Headaches     Physical Exam  Vitals  Blood pressure (!) 90/50, pulse 72, temperature 98.4 F (36.9 C), temperature source Oral, resp. rate 16, SpO2 99 %.   General appearance Young male no acute distress at this time obese HEENT no jaundice or pallor, no facial deviation oral thrush Neck supple, no neck vein distention Chest clear and resonant, left chest hemodialysis catheter noted Heart normal S1-S2 no murmurs gallops or rubs Abdomen soft, nontender, scars of fourth surgery well noted, obese Extremities no clubbing cyanosis mild dependent edema, left AV graft noted Skin no rashes or ulcers except for old surgery scars Neuro grossly nonfocal patient moving all extremities Psych no homicidal or suicidal ideations  Data Review  CBC Recent Labs  Lab 02/14/19 1320  WBC 5.9  HGB 12.1*  HCT 37.4*  PLT 170  MCV 94.2  MCH 30.5  MCHC 32.4  RDW 17.5*  LYMPHSABS 1.4  MONOABS 0.4  EOSABS 0.3  BASOSABS 0.1   ------------------------------------------------------------------------------------------------------------------  Chemistries  Recent Labs  Lab 02/14/19 1320  NA 135  K 3.7  CL 95*  CO2 25  GLUCOSE 86  BUN 28*  CREATININE 8.91*  CALCIUM 10.6*    ------------------------------------------------------------------------------------------------------------------ CrCl cannot be calculated (Unknown ideal weight.). ------------------------------------------------------------------------------------------------------------------ No results for input(s): TSH, T4TOTAL, T3FREE, THYROIDAB in the last 72 hours.  Invalid input(s): FREET3   Coagulation profile No results for input(s): INR, PROTIME in the last 168 hours. ------------------------------------------------------------------------------------------------------------------- No results for input(s): DDIMER in the last 72 hours. -------------------------------------------------------------------------------------------------------------------  Cardiac Enzymes No results for input(s): CKMB, TROPONINI, MYOGLOBIN in the last 168 hours.  Invalid input(s): CK ------------------------------------------------------------------------------------------------------------------ Invalid input(s): POCBNP   ---------------------------------------------------------------------------------------------------------------  Urinalysis    Component Value Date/Time   COLORURINE RED (A) 11/17/2015 0910   APPEARANCEUR TURBID (A) 11/17/2015 0910   LABSPEC 1.035 (H) 11/17/2015 0910   PHURINE 6.0 11/17/2015 0910   GLUCOSEU 100 (A) 11/17/2015 0910   HGBUR LARGE (A) 11/17/2015 0910   BILIRUBINUR LARGE (A) 11/17/2015 0910   KETONESUR 40 (A) 11/17/2015 0910   PROTEINUR >300 (A) 11/17/2015 0910   UROBILINOGEN 0.2 11/24/2013 0242   NITRITE POSITIVE (A) 11/17/2015 0910   LEUKOCYTESUR LARGE (A) 11/17/2015 0910    ----------------------------------------------------------------------------------------------------------------   Imaging results:   Dg Chest Portable 1 View  Result Date: 02/14/2019 CLINICAL DATA:  Dialysis.  Chest pressure. EXAM: PORTABLE CHEST 1 VIEW COMPARISON:  10/30/2018. FINDINGS:  Left IJ dual-lumen catheter noted with tip over cavoatrial junction. Vascular stent noted over the right upper mediastinum, this appears narrowed. This is unchanged. Prior CABG. Cardiomegaly with normal pulmonary vascularity. Mild left base atelectasis/infiltrate and small left pleural effusion. IMPRESSION: 1.  Dual-lumen left IJ catheter stable position.  2.  Prior CABG.  Stable cardiomegaly. 3. Mild left base atelectasis/infiltrate and small left pleural effusion. Electronically Signed   By: Marcello Moores  Register   On: 02/14/2019 12:39    My personal review of EKG: Normal sinus rhythm versus ectopic rhythm at 85bpm with lateral ischemia  Personally reviewed Old Chart from 10/2018  Assessment & Plan  ACS , unstable angina with history of CAD.  Troponin at 289 EKG shows lateral ischemia Continue with aspirin and statin and Zebeta Cardiology on consult Serial troponins  Diabetes mellitus type 2 Hold Prandin Insulin sliding scale  End-stage renal disease, status post renal transplant with history of rejection Hemodialysis, last hemodialysis today.  Consult nephrology in a.m. for hemodialysis  Diastolic congestive heart failure Clinically stable at this time Last echocardiogram on 08/20/2018 showing grade 1 diastolic dysfunction and ejection fraction 50-55%  Essential hypertension Continue with Norvasc and Zebeta  History of abdominal abscesses/chronic hematomas   DVT Prophylaxis Heparin  AM Labs Ordered, also please review Full Orders  Family Communication: called shelia his wife , Discussed situation  Code Status full  Disposition Plan: Home  Time spent in minutes : 48 minutes  Condition GUARDED   @SIGNATURE @

## 2019-02-14 NOTE — Progress Notes (Addendum)
Patient refused lab sticks, patient education completed he verbalized understanding, will ask lab to retry later. Hiajzi MD notified.

## 2019-02-15 ENCOUNTER — Encounter (HOSPITAL_COMMUNITY): Admission: EM | Disposition: A | Discharge status: Home / Self Care | Payer: Self-pay | Attending: Internal Medicine

## 2019-02-15 ENCOUNTER — Encounter (HOSPITAL_COMMUNITY): Payer: Self-pay | Admitting: Cardiology

## 2019-02-15 DIAGNOSIS — E785 Hyperlipidemia, unspecified: Secondary | ICD-10-CM

## 2019-02-15 DIAGNOSIS — I953 Hypotension of hemodialysis: Secondary | ICD-10-CM

## 2019-02-15 DIAGNOSIS — D638 Anemia in other chronic diseases classified elsewhere: Secondary | ICD-10-CM

## 2019-02-15 DIAGNOSIS — I200 Unstable angina: Secondary | ICD-10-CM

## 2019-02-15 DIAGNOSIS — I25110 Atherosclerotic heart disease of native coronary artery with unstable angina pectoris: Principal | ICD-10-CM

## 2019-02-15 HISTORY — PX: LEFT HEART CATH AND CORONARY ANGIOGRAPHY: CATH118249

## 2019-02-15 LAB — CBC
HCT: 35.8 % — ABNORMAL LOW (ref 39.0–52.0)
Hemoglobin: 11.6 g/dL — ABNORMAL LOW (ref 13.0–17.0)
MCH: 30.5 pg (ref 26.0–34.0)
MCHC: 32.4 g/dL (ref 30.0–36.0)
MCV: 94.2 fL (ref 80.0–100.0)
Platelets: 137 10*3/uL — ABNORMAL LOW (ref 150–400)
RBC: 3.8 MIL/uL — ABNORMAL LOW (ref 4.22–5.81)
RDW: 17.8 % — ABNORMAL HIGH (ref 11.5–15.5)
WBC: 5.7 10*3/uL (ref 4.0–10.5)
nRBC: 0 % (ref 0.0–0.2)

## 2019-02-15 LAB — BASIC METABOLIC PANEL
Anion gap: 15 (ref 5–15)
BUN: 39 mg/dL — ABNORMAL HIGH (ref 8–23)
CO2: 23 mmol/L (ref 22–32)
Calcium: 9.4 mg/dL (ref 8.9–10.3)
Chloride: 97 mmol/L — ABNORMAL LOW (ref 98–111)
Creatinine, Ser: 11.33 mg/dL — ABNORMAL HIGH (ref 0.61–1.24)
GFR calc Af Amer: 5 mL/min — ABNORMAL LOW (ref >60)
GFR calc non Af Amer: 4 mL/min — ABNORMAL LOW (ref >60)
Glucose, Bld: 101 mg/dL — ABNORMAL HIGH (ref 70–99)
Potassium: 3.8 mmol/L (ref 3.5–5.1)
Sodium: 135 mmol/L (ref 135–145)

## 2019-02-15 LAB — GLUCOSE, CAPILLARY
Glucose-Capillary: 96 mg/dL (ref 70–99)
Glucose-Capillary: 132 mg/dL — ABNORMAL HIGH (ref 70–99)
Glucose-Capillary: 132 mg/dL — ABNORMAL HIGH (ref 70–99)
Glucose-Capillary: 255 mg/dL — ABNORMAL HIGH (ref 70–99)
Glucose-Capillary: 133 mg/dL — ABNORMAL HIGH (ref 70–99)

## 2019-02-15 LAB — LIPID PANEL
Cholesterol: 122 mg/dL (ref 0–200)
HDL: 46 mg/dL (ref >40)
LDL Cholesterol: 59 mg/dL (ref 0–99)
Total CHOL/HDL Ratio: 2.7 RATIO
Triglycerides: 85 mg/dL (ref <150)
VLDL: 17 mg/dL (ref 0–40)

## 2019-02-15 LAB — MRSA PCR SCREENING: MRSA by PCR: NEGATIVE

## 2019-02-15 LAB — POCT ACTIVATED CLOTTING TIME: Activated Clotting Time: 142 seconds

## 2019-02-15 LAB — HEPARIN LEVEL (UNFRACTIONATED): Heparin Unfractionated: 0.44 IU/mL (ref 0.30–0.70)

## 2019-02-15 LAB — TROPONIN I (HIGH SENSITIVITY): Troponin I (High Sensitivity): 175 ng/L (ref <18)

## 2019-02-15 SURGERY — LEFT HEART CATH AND CORONARY ANGIOGRAPHY
Anesthesia: LOCAL

## 2019-02-15 MED ORDER — LIDOCAINE HCL (PF) 1 % IJ SOLN
INTRAMUSCULAR | Status: DC | PRN
  Administered 2019-02-15: 20 mL

## 2019-02-15 MED ORDER — MIDAZOLAM HCL 2 MG/2ML IJ SOLN
INTRAMUSCULAR | Status: DC | PRN
  Administered 2019-02-15: 1 mg via INTRAVENOUS

## 2019-02-15 MED ORDER — HEPARIN (PORCINE) IN NACL 1000-0.9 UT/500ML-% IV SOLN
INTRAVENOUS | Status: AC
  Filled 2019-02-15: qty 500

## 2019-02-15 MED ORDER — LIDOCAINE HCL (PF) 1 % IJ SOLN
INTRAMUSCULAR | Status: AC
  Filled 2019-02-15: qty 30

## 2019-02-15 MED ORDER — CHLORHEXIDINE GLUCONATE CLOTH 2 % EX PADS
6.0000 | MEDICATED_PAD | Freq: Every day | CUTANEOUS | Status: DC
  Administered 2019-02-16: 06:00:00 6 via TOPICAL

## 2019-02-15 MED ORDER — FENTANYL CITRATE (PF) 100 MCG/2ML IJ SOLN
INTRAMUSCULAR | Status: AC
  Filled 2019-02-15: qty 2

## 2019-02-15 MED ORDER — SODIUM CHLORIDE 0.9 % IV SOLN
INTRAVENOUS | Status: DC
  Administered 2019-02-15: 07:00:00 via INTRAVENOUS

## 2019-02-15 MED ORDER — SODIUM CHLORIDE 0.9% FLUSH: 3.0000 mL | INTRAVENOUS | Status: DC | PRN

## 2019-02-15 MED ORDER — HEPARIN (PORCINE) IN NACL 1000-0.9 UT/500ML-% IV SOLN
INTRAVENOUS | Status: DC | PRN
  Administered 2019-02-15 (×3): 500 mL

## 2019-02-15 MED ORDER — SODIUM CHLORIDE 0.9 % IV SOLN: 250.0000 mL | INTRAVENOUS | Status: DC | PRN

## 2019-02-15 MED ORDER — IOHEXOL 350 MG/ML SOLN
INTRAVENOUS | Status: DC | PRN
  Administered 2019-02-15: 11:00:00 70 mL via INTRACARDIAC

## 2019-02-15 MED ORDER — ASPIRIN 81 MG PO CHEW
81.0000 mg | CHEWABLE_TABLET | ORAL | Status: AC
  Administered 2019-02-15: 07:00:00 81 mg via ORAL
  Filled 2019-02-15: qty 1

## 2019-02-15 MED ORDER — SODIUM CHLORIDE 0.9% FLUSH
3.0000 mL | Freq: Two times a day (BID) | INTRAVENOUS | Status: DC
  Administered 2019-02-15: 13:00:00 3 mL via INTRAVENOUS

## 2019-02-15 MED ORDER — MIDAZOLAM HCL 2 MG/2ML IJ SOLN
INTRAMUSCULAR | Status: AC
  Filled 2019-02-15: qty 2

## 2019-02-15 MED ORDER — SODIUM CHLORIDE 0.9% FLUSH: 3.0000 mL | Freq: Two times a day (BID) | INTRAVENOUS | Status: DC

## 2019-02-15 MED ORDER — METHYLPREDNISOLONE SODIUM SUCC 125 MG IJ SOLR
125.0000 mg | Freq: Once | INTRAMUSCULAR | Status: AC
  Administered 2019-02-15: 125 mg via INTRAVENOUS
  Filled 2019-02-15: qty 2

## 2019-02-15 MED ORDER — DIPHENHYDRAMINE HCL 50 MG/ML IJ SOLN
25.0000 mg | Freq: Once | INTRAMUSCULAR | Status: AC
  Administered 2019-02-15: 08:00:00 25 mg via INTRAVENOUS
  Filled 2019-02-15: qty 1

## 2019-02-15 MED ORDER — SODIUM CHLORIDE 0.9 % IV SOLN: INTRAVENOUS | Status: DC

## 2019-02-15 MED ORDER — HEPARIN (PORCINE) 25000 UT/250ML-% IV SOLN
1150.0000 [IU]/h | INTRAVENOUS | Status: DC
  Administered 2019-02-15: 20:00:00 1150 [IU]/h via INTRAVENOUS
  Filled 2019-02-15: qty 250

## 2019-02-15 MED ORDER — SODIUM CHLORIDE 0.9% FLUSH
3.0000 mL | Freq: Two times a day (BID) | INTRAVENOUS | Status: DC
  Administered 2019-02-15 – 2019-02-16 (×2): 3 mL via INTRAVENOUS

## 2019-02-15 MED ORDER — FENTANYL CITRATE (PF) 100 MCG/2ML IJ SOLN
INTRAMUSCULAR | Status: DC | PRN
  Administered 2019-02-15: 50 ug via INTRAVENOUS

## 2019-02-15 SURGICAL SUPPLY — 12 items
CATH EXPO 5F MPA-1 (CATHETERS) ×1 IMPLANT
CATH INFINITI 5FR AL1 (CATHETERS) ×1 IMPLANT
CATH INFINITI 5FR ANG PIGTAIL (CATHETERS) ×1 IMPLANT
CATH INFINITI JR4 5F (CATHETERS) IMPLANT
KIT HEART LEFT (KITS) ×2 IMPLANT
PACK CARDIAC CATHETERIZATION (CUSTOM PROCEDURE TRAY) ×2 IMPLANT
SHEATH PINNACLE 5F 10CM (SHEATH) ×1 IMPLANT
SHEATH PROBE COVER 6X72 (BAG) ×1 IMPLANT
TRANSDUCER W/STOPCOCK (MISCELLANEOUS) ×2 IMPLANT
TUBING CIL FLEX 10 FLL-RA (TUBING) ×2 IMPLANT
WIRE EMERALD 3MM-J .035X150CM (WIRE) ×1 IMPLANT
WIRE EMERALD ST .035X150CM (WIRE) ×1 IMPLANT

## 2019-02-15 NOTE — Progress Notes (Signed)
Called cath lab regarding pre-cath solumedrol and benadryl timing for scheduled 0900 cath. Per Cath lab RN give pre-labs now and plavix. Per cath solumedrol, benadryl, and plavix given at [place]. Will continue to monitor.  Amanda Cockayne, RN

## 2019-02-15 NOTE — Progress Notes (Signed)
Renal Navigator notified OP HD clinic/[facility] of admission and negative COVID 19 rapid test result in order to provide continuity of care.  Alphonzo Cruise, [place] Renal Navigator [phone]

## 2019-02-15 NOTE — Progress Notes (Signed)
Site area: Right giron a 5 french sheath was removed groin  Site Prior to Removal:  Level 0  Pressure Applied For 15 MINUTES    Bedrest Beginning at 1205p  Manual:   Yes.    Patient Status During Pull:  Stable  Hypotensive  Post Pull Groin Site:  Level 0  Post Pull Instructions Given:  Yes.    Post Pull Pulses Present:  Yes.    Dressing Applied:  Yes.    Comments:

## 2019-02-15 NOTE — Interval H&P Note (Signed)
History and Physical Interval Note:  02/15/2019 10:32 AM  Varney Daily  has presented today for surgery, with the diagnosis of unstable angina.  The various methods of treatment have been discussed with the patient and family. After consideration of risks, benefits and other options for treatment, the patient has consented to  Procedure(s): LEFT HEART CATH AND CORS/GRAFTS ANGIOGRAPHY (N/A) as a surgical intervention.  The patient's history has been reviewed, patient examined, no change in status, stable for surgery.  I have reviewed the patient's chart and labs.  Questions were answered to the patient's satisfaction.   Cath Lab Visit (complete for each Cath Lab visit)  Clinical Evaluation Leading to the Procedure:   ACS: Yes.    Non-ACS:    Anginal Classification: CCS IV  Anti-ischemic medical therapy: Maximal Therapy (2 or more classes of medications)  Non-Invasive Test Results: No non-invasive testing performed  Prior CABG: Previous CABG        Collier Salina [facility] 02/15/2019 10:32 AM

## 2019-02-15 NOTE — Progress Notes (Signed)
ANTICOAGULATION CONSULT NOTE - Follow Up Consult  Pharmacy Consult for heparin Indication: chest pain/ACS  Allergies  Allergen Reactions  . Baclofen Other (See Comments)    Possibly stroke like symptoms  . Iodinated Diagnostic Agents Swelling, Rash and Other (See Comments)    Other Reaction: burning to mouth, swelling of lips  . Lipitor [Atorvastatin] Other (See Comments)    Leg pain  . Metoprolol Other (See Comments)    Headaches     Patient Measurements: Height: 5\' 6"  (167.6 cm) Weight: 214 lb 9.6 oz (97.3 kg) IBW/kg (Calculated) : 63.8  Vital Signs: Temp: 98 F (36.7 C) (07/21 1223) Temp Source: Oral (07/21 1223) BP: 86/44 (07/21 1223) Pulse Rate: 75 (07/21 1223)  Labs: Recent Labs    02/14/19 1320 02/15/19 0049  HGB 12.1* 11.6*  HCT 37.4* 35.8*  PLT 170 137*  HEPARINUNFRC  --  0.44  CREATININE 8.91* 11.33*  TROPONINIHS 289*  --     Estimated Creatinine Clearance: 7.2 mL/min (A) (by C-G formula based on SCr of 11.33 mg/dL (H)).  Assessment: Anticoag: hx Aflutter post ablation 2018 and came off Coumadin. Heparin for ACS, troponin 289. HL 0.44 in goal. Hgb 12.1>11.6, Plts 170>137 - 7/21 Cath: resume hep 8 hrs post-sheath (groin, out 1200). Resume heparin and continue DAPT. Will bring back to the cath lab tomorrow for atherectomy and stenting of the RCA.    Goal of Therapy:  Heparin level 0.3-0.7 units/ml Monitor platelets by anticoagulation protocol: Yes   Plan:  Resume IV heparin at heparin 1150 units/hr at 8PM Daily heparin level and CBC atherectomy and stenting of the RCA on Wed  Newman Waren S. Alford Highland, PharmD, [place] Clinical Staff Pharmacist Eilene Ghazi [place] 02/15/2019,12:30 PM

## 2019-02-15 NOTE — Progress Notes (Signed)
PROGRESS NOTE  Patrick [name] BMW:[id number] DOB: 05/28/1956   PCP: Nolene Ebbs, MD  Patient is from: Home  DOA: 02/14/2019 LOS: 1  Brief Narrative / Interim history: 63 year old male with history of CAD/CABG in 1997, [place] in 2019, ESRD on HD MWF, HTN, atrial flutter status post ablation, DM-2 and diastolic CHF presenting with chest pain with associated diaphoresis and dyspnea.  Initial high-sensitivity troponin elevated to 289.  EKG showed T wave changes in lateral leads although present on old EKG.  Cardiology consulted.  He underwent left heart catheterization that revealed high-grade CAD in mid RCA and moderate CAD in distal RCA and 100% occlusion in proximal LAD.  Cardiology to take him back to Cath Lab for arthrectomy and stenting of the RCA on 7/22.  Subjective: No major events overnight of this morning.  Evaluated before he went for left heart catheterization.  No further chest pain, dyspnea, palpitation or dizziness.  No GI or GU symptoms.  No orthopnea  Objective: Vitals:   02/15/19 1223 02/15/19 1245 02/15/19 1300 02/15/19 1330  BP: (!) 86/44 (!) 105/58 (!) 95/53 112/71  Pulse: 75 96 77 78  Resp: 19 13 16  (!) 22  Temp: 98 F (36.7 C)     TempSrc: Oral     SpO2: 94% 100%    Weight:      Height:        Intake/Output Summary (Last 24 hours) at 02/15/2019 1412 Last data filed at 02/15/2019 0800 Gross per 24 hour  Intake 0 ml  Output 0 ml  Net 0 ml   Filed Weights   02/14/19 1600 02/15/19 0542  Weight: 93.5 kg 97.3 kg    Examination:  GENERAL: No acute distress.  Appears well.  HEENT: MMM.  Vision and hearing grossly intact.  NECK: Supple.  No apparent JVD.  RESP:  No IWOB. Good air movement bilaterally. CVS:  RRR. Heart sounds normal.  ABD/GI/GU: Bowel sounds present. Soft. Non tender.  MSK/EXT:  Moves extremities. No apparent deformity or edema.  SKIN: no apparent skin lesion or wound NEURO: Awake, alert and oriented appropriately.  No gross deficit.   PSYCH: Calm. Normal affect.   HD cath over left chest.  I have personally reviewed the following labs and images:  Radiology Studies: No results found.  Microbiology: Recent Results (from the past 240 hour(s))  SARS Coronavirus 2 (CEPHEID - Performed in [place] hospital lab), Hosp Order     Status: None   Collection Time: 02/14/19  1:52 PM   Specimen: Nasopharyngeal Swab  Result Value Ref Range Status   SARS Coronavirus 2 NEGATIVE NEGATIVE Final    Comment: (NOTE) If result is NEGATIVE SARS-CoV-2 target nucleic acids are NOT DETECTED. The SARS-CoV-2 RNA is generally detectable in upper and lower  respiratory specimens during the acute phase of infection. The lowest  concentration of SARS-CoV-2 viral copies this assay can detect is 250  copies / mL. A negative result does not preclude SARS-CoV-2 infection  and should not be used as the sole basis for treatment or other  patient management decisions.  A negative result may occur with  improper specimen collection / handling, submission of specimen other  than nasopharyngeal swab, presence of viral mutation(s) within the  areas targeted by this assay, and inadequate number of viral copies  (<250 copies / mL). A negative result must be combined with clinical  observations, patient history, and epidemiological information. If result is POSITIVE SARS-CoV-2 target nucleic acids are DETECTED. The SARS-CoV-2 RNA  is generally detectable in upper and lower  respiratory specimens dur ing the acute phase of infection.  Positive  results are indicative of active infection with SARS-CoV-2.  Clinical  correlation with patient history and other diagnostic information is  necessary to determine patient infection status.  Positive results do  not rule out bacterial infection or co-infection with other viruses. If result is PRESUMPTIVE POSTIVE SARS-CoV-2 nucleic acids MAY BE PRESENT.   A presumptive positive result was obtained on the  submitted specimen  and confirmed on repeat testing.  While 2019 novel coronavirus  (SARS-CoV-2) nucleic acids may be present in the submitted sample  additional confirmatory testing may be necessary for epidemiological  and / or clinical management purposes  to differentiate between  SARS-CoV-2 and other Sarbecovirus currently known to infect humans.  If clinically indicated additional testing with an alternate test  methodology [id number]) is advised. The SARS-CoV-2 RNA is generally  detectable in upper and lower respiratory sp ecimens during the acute  phase of infection. The expected result is Negative. Fact Sheet for Patients:  StrictlyIdeas.no Fact Sheet for Healthcare Providers: BankingDealers.co.za This test is not yet approved or cleared by the [place] FDA and has been authorized for detection and/or diagnosis of SARS-CoV-2 by FDA under an Emergency Use Authorization (EUA).  This EUA will remain in effect (meaning this test can be used) for the duration of the COVID-19 declaration under Section 564(b)(1) of the Act, 21 U.S.C. section 360bbb-3(b)(1), unless the authorization is terminated or revoked sooner. Performed at [facility], [place] [address] [place], [place] [address]   MRSA PCR Screening     Status: None   Collection Time: 02/14/19 10:41 PM   Specimen: Nasopharyngeal  Result Value Ref Range Status   MRSA by PCR NEGATIVE NEGATIVE Final    Comment:        The GeneXpert MRSA Assay (FDA approved for NASAL specimens only), is one component of a comprehensive MRSA colonization surveillance program. It is not intended to diagnose MRSA infection nor to guide or monitor treatment for MRSA infections. Performed at [facility], [place] [address], [place],  [address]     Sepsis Labs: Invalid input(s): PROCALCITONIN, LACTICIDVEN  Urine analysis:    Component Value Date/Time   COLORURINE RED (A)  11/17/2015 0910   APPEARANCEUR TURBID (A) 11/17/2015 0910   LABSPEC 1.035 (H) 11/17/2015 0910   PHURINE 6.0 11/17/2015 0910   GLUCOSEU 100 (A) 11/17/2015 0910   HGBUR LARGE (A) 11/17/2015 0910   BILIRUBINUR LARGE (A) 11/17/2015 0910   KETONESUR 40 (A) 11/17/2015 0910   PROTEINUR >300 (A) 11/17/2015 0910   UROBILINOGEN 0.2 11/24/2013 0242   NITRITE POSITIVE (A) 11/17/2015 0910   LEUKOCYTESUR LARGE (A) 11/17/2015 0910    Anemia Panel: No results for input(s): VITAMINB12, FOLATE, FERRITIN, TIBC, IRON, RETICCTPCT in the last 72 hours.  Thyroid Function Tests: No results for input(s): TSH, T4TOTAL, FREET4, T3FREE, THYROIDAB in the last 72 hours.  Lipid Profile: Recent Labs    02/15/19 0049  CHOL 122  HDL 46  LDLCALC 59  TRIG 85  CHOLHDL 2.7    CBG: Recent Labs  Lab 02/14/19 2238 02/15/19 0643 02/15/19 1149 02/15/19 1222  GLUCAP 113* 96 132* 132*    HbA1C: No results for input(s): HGBA1C in the last 72 hours.  BNP (last 3 results): No results for input(s): PROBNP in the last 8760 hours.  Cardiac Enzymes: No results for input(s): CKTOTAL, CKMB, CKMBINDEX, TROPONINI in the last  168 hours.  Coagulation Profile: No results for input(s): INR, PROTIME in the last 168 hours.  Liver Function Tests: No results for input(s): AST, ALT, ALKPHOS, BILITOT, PROT, ALBUMIN in the last 168 hours. No results for input(s): LIPASE, AMYLASE in the last 168 hours. No results for input(s): AMMONIA in the last 168 hours.  Basic Metabolic Panel: Recent Labs  Lab 02/14/19 1320 02/15/19 0049  NA 135 135  K 3.7 3.8  CL 95* 97*  CO2 25 23  GLUCOSE 86 101*  BUN 28* 39*  CREATININE 8.91* 11.33*  CALCIUM 10.6* 9.4   GFR: Estimated Creatinine Clearance: 7.2 mL/min (A) (by C-G formula based on SCr of 11.33 mg/dL (H)).  CBC: Recent Labs  Lab 02/14/19 1320 02/15/19 0049  WBC 5.9 5.7  NEUTROABS 3.7  --   HGB 12.1* 11.6*  HCT 37.4* 35.8*  MCV 94.2 94.2  PLT 170 137*     Procedures:  7/21-left heart catheterization  Microbiology summarized: COVID-19 negative  Assessment & Plan: Acute coronary syndrome/CAD/history of CABG in 1997: Chest pain resolved. -High-sensitivity troponin elevated to 289.  EKG with T wave changes in lateral leads -LHC on 7/21 high-grade CAD in mid RCA and moderate CAD in distal RCA and 100% occlusion in proximal LAD. -Plan for arthrectomy and stenting on 7/22 -Continue aspirin, Plavix, Zetia, Crestor and Zebeta -Continue PRN nitro -Appreciate cardiology's care  ESRD on HD MWF: Last HD on 7/20.  Complete the session.  No urgent need for dialysis. -Nephrology, Dr. Melvia Heaps notified  Anemia of renal disease/BMD: Stable -Per nephrology  Chronic diastolic CHF: Stable.  Euvolemic.  Currently without cardiopulmonary symptoms. -Echo in 07/2018 with EF of 50 to 55%, G1 DD and severely calcified MV annulus. -Continue monitoring -Fluid management by HD  History of a flutter status post ablation -Zebeta as above.  History of hypotension: -Continue midodrine  NIDDM-2: On Prandin at home. -Hold home medication -CBG monitoring and sliding scale insulin  DVT prophylaxis: Subcu heparin Code Status: Full code Family Communication: Patient and/or RN. Available if any question.  Disposition Plan: Remains inpatient for acute coronary syndrome.  Plan for coronary arthrectomy and stent placement on 7/22 Consultants: Cardiology, nephrology   Antimicrobials: Anti-infectives (From admission, onward)   None      Sch Meds:  Scheduled Meds: . aspirin  324 mg Oral Once  . aspirin EC  81 mg Oral Daily  . bisoprolol  5 mg Oral Daily  . calcium acetate  2,668 mg Oral Q supper  . calcium-vitamin D  2 tablet Oral Q breakfast  . clopidogrel  75 mg Oral Daily  . ezetimibe  10 mg Oral Daily  . insulin aspart  0-5 Units Subcutaneous QHS  . insulin aspart  0-9 Units Subcutaneous TID WC  . [START ON 02/16/2019] midodrine  10 mg Oral Q M,W,F  .  multivitamin  1 tablet Oral Daily  . nitroGLYCERIN  0.5 inch Topical Q6H  . pantoprazole  40 mg Oral Daily  . rosuvastatin  10 mg Oral Daily  . sodium chloride flush  3 mL Intravenous Q12H  . sodium chloride flush  3 mL Intravenous Q12H  . sodium chloride flush  3 mL Intravenous Q12H   Continuous Infusions: . sodium chloride    . sodium chloride    . heparin     PRN Meds:.sodium chloride, sodium chloride, acetaminophen, albuterol, dextromethorphan, docusate sodium, morphine injection, oxyCODONE-acetaminophen, sodium chloride flush, sodium chloride flush   Taye T. Dublin  If 7PM-7AM, please  contact night-coverage [email or web address] Password [email or web address] 02/15/2019, 2:12 PM

## 2019-02-15 NOTE — Progress Notes (Signed)
Progress Note  Patient Name: Patrick [name] Date of Encounter: 02/15/2019  Primary Cardiologist: Dorris Carnes, MD  EP   [facility] MD    Subjective   No CP today  Breathing is OK    Inpatient Medications    Scheduled Meds: . amLODipine  5 mg Oral [name]  . aspirin  324 mg Oral Once  . aspirin EC  81 mg Oral [name]  . bisoprolol  5 mg Oral [name]  . calcium acetate  2,668 mg Oral Q supper  . calcium-vitamin D  2 tablet Oral Q breakfast  . clopidogrel  75 mg Oral [name]  . ezetimibe  10 mg Oral [name]  . insulin aspart  0-5 Units Subcutaneous QHS  . insulin aspart  0-9 Units Subcutaneous TID WC  . [START ON 02/16/2019] midodrine  10 mg Oral Q M,W,F  . multivitamin  1 tablet Oral [name]  . nitroGLYCERIN  0.5 inch Topical Q6H  . pantoprazole  40 mg Oral [name]  . rosuvastatin  10 mg Oral [name]  . sodium chloride flush  3 mL Intravenous Q12H  . sodium chloride flush  3 mL Intravenous Q12H   Continuous Infusions: . sodium chloride    . sodium chloride    . sodium chloride 10 mL/hr at 02/15/19 3846  . heparin 1,150 Units/hr (02/14/19 1756)   PRN Meds: sodium chloride, sodium chloride, acetaminophen, albuterol, dextromethorphan, docusate sodium, morphine injection, oxyCODONE-acetaminophen, sodium chloride flush, sodium chloride flush   Vital Signs    Vitals:   02/15/19 0542 02/15/19 0700 02/15/19 0817 02/15/19 0900  BP:   (!) 86/11   Pulse:   81   Resp: (!) 21 16 (!) 22 17  Temp:   97.9 F (36.6 C)   TempSrc:   Oral   SpO2:   100%   Weight: 97.3 kg     Height:       No intake or output data in the 24 hours ending 02/15/19 1017 Last 3 Weights 02/15/2019 02/14/2019 12/09/2018  Weight (lbs) 214 lb 9.6 oz 206 lb 2.1 oz 208 lb  Weight (kg) 97.342 kg 93.5 kg 94.348 kg      Telemetry    SR  - Personally Reviewed  ECG    None - Personally Reviewed  Physical Exam   GEN: No acute distress.   Neck: No JVD Cardiac: RRR, no murmurs, rubs, or gallops.  Respiratory: Clear to  auscultation bilaterally. GI: Soft, nontender, non-distended  MS: No edema; No deformity. Neuro:  Nonfocal  Psych: Normal affect   Labs    High Sensitivity Troponin:   Recent Labs  Lab 02/14/19 1320  TROPONINIHS 289*      Cardiac EnzymesNo results for input(s): TROPONINI in the last 168 hours. No results for input(s): TROPIPOC in the last 168 hours.   Chemistry Recent Labs  Lab 02/14/19 1320 02/15/19 0049  NA 135 135  K 3.7 3.8  CL 95* 97*  CO2 25 23  GLUCOSE 86 101*  BUN 28* 39*  CREATININE 8.91* 11.33*  CALCIUM 10.6* 9.4  GFRNONAA 6* 4*  GFRAA 7* 5*  ANIONGAP 15 15     Hematology Recent Labs  Lab 02/14/19 1320 02/15/19 0049  WBC 5.9 5.7  RBC 3.97* 3.80*  HGB 12.1* 11.6*  HCT 37.4* 35.8*  MCV 94.2 94.2  MCH 30.5 30.5  MCHC 32.4 32.4  RDW 17.5* 17.8*  PLT 170 137*    BNPNo results for input(s): BNP, PROBNP in the last 168 hours.  DDimer No results for input(s): DDIMER in the last 168 hours.   Radiology    Dg Chest Portable 1 View  Result Date: 02/14/2019 CLINICAL DATA:  Dialysis.  Chest pressure. EXAM: PORTABLE CHEST 1 VIEW COMPARISON:  10/30/2018. FINDINGS: Left IJ dual-lumen catheter noted with tip over cavoatrial junction. Vascular stent noted over the right upper mediastinum, this appears narrowed. This is unchanged. Prior CABG. Cardiomegaly with normal pulmonary vascularity. Mild left base atelectasis/infiltrate and small left pleural effusion. IMPRESSION: 1.  Dual-lumen left IJ catheter stable position. 2.  Prior CABG.  Stable cardiomegaly. 3. Mild left base atelectasis/infiltrate and small left pleural effusion. Electronically Signed   By: Marcello Moores  Register   On: 02/14/2019 12:39    Cardiac Studies   Echo 08/20/2018 Study Conclusions  - Left ventricle: The cavity size was normal. Wall thickness was increased in a pattern of mild LVH. Systolic function was normal. The estimated ejection fraction was in the range of 50% to 55%. Wall  motion was normal; there were no regional wall motion abnormalities. Doppler parameters are consistent with abnormal left ventricular relaxation (grade 1 diastolic dysfunction). Doppler parameters are consistent with high ventricular filling pressure. - Aortic valve: There was mild stenosis. There was trivial regurgitation. - Mitral valve: Severely calcified annulus.  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  Total occlusion of the proximal to mid LAD filling late by left to left and right to left collaterals.First diagonal contains 50% proximal narrowing.  Left main trunk is normal.  Circumflex gives origin to 3 obtuse marginal branches. The first obtuse marginal contains 50% proximal narrowing.  Right coronary artery is dominant giving PDA and left ventricular branch. Diffuse calcified 60% stenosis is noted throughout the mid vessel ending in the distal segment. No high-grade focal obstruction is seen.  Left ventriculogram and LV pressures were not recorded.  RECOMMENDATIONS:   Angina is related to total occlusion of the proximal to mid LAD. LIMA to the LAD is atretic. 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  Discussed with patient.     Patient Profile     63 y.o. 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 Regenia Skeeter.   Assessment & Plan    1  Chest pain   Pt with signif CAD   Seen in May 2019 with cath   Admitted in April   Cath recomm  Pt refused at time    Now present with worsening CP despited increase in medical Rx  Plan for L heart cath today  To redefine anatomy     2  HTN  BP soft today  Stop amlodipine   (may have been given )  Follow BP   If running low at home may be causing problems    3  ESRD  Gets HD MWF   Last done yesterday  4  HL  LDL 95  Crestor just increased   WIll need to be followed  For questions or updates, please contact [place] Please consult [email or web address] for contact info under        Signed, Dorris Carnes, MD  02/15/2019, 10:17 AM

## 2019-02-15 NOTE — Progress Notes (Signed)
Pt seen and orders written for HD tomorrow post -cath. Full consult to follow tomorrow.   Kelly Splinter, MD 02/15/2019, 5:20 PM

## 2019-02-15 NOTE — Progress Notes (Signed)
ANTICOAGULATION CONSULT NOTE - Follow Up Consult  Pharmacy Consult for heparin Indication: chest pain/ACS  Allergies  Allergen Reactions  . Baclofen Other (See Comments)    Possibly stroke like symptoms  . Iodinated Diagnostic Agents Swelling, Rash and Other (See Comments)    Other Reaction: burning to mouth, swelling of lips  . Lipitor [Atorvastatin] Other (See Comments)    Leg pain  . Metoprolol Other (See Comments)    Headaches     Patient Measurements: Height: 5\' 6"  (947.0 cm) Weight: 214 lb 9.6 oz (97.3 kg) IBW/kg (Calculated) : 63.8  Vital Signs: Temp: 97.9 F (36.6 C) (07/21 0817) Temp Source: Oral (07/21 0817) BP: 86/11 (07/21 0817) Pulse Rate: 81 (07/21 0817)  Labs: Recent Labs    02/14/19 1320 02/15/19 0049  HGB 12.1* 11.6*  HCT 37.4* 35.8*  PLT 170 137*  HEPARINUNFRC  --  0.44  CREATININE 8.91* 11.33*  TROPONINIHS 289*  --     Estimated Creatinine Clearance: 7.2 mL/min (A) (by C-G formula based on SCr of 11.33 mg/dL (H)).  Assessment: Anticoag: hx Aflutter post ablation 2018 and came off Coumadin. Heparin for ACS, troponin 289. HL 0.44 in goal. Hgb 12.1>11.6, Plts 170>137   Goal of Therapy:  Heparin level 0.3-0.7 units/ml Monitor platelets by anticoagulation protocol: Yes   Plan:  Continue heparin 1150 units/hr Daily heparin level and CBC Cath  Yamili Lichtenwalner S. Alford Highland, PharmD, BCPS Clinical Staff Pharmacist Eilene Ghazi Stillinger 02/15/2019,8:38 AM

## 2019-02-15 NOTE — Progress Notes (Signed)
ANTICOAGULATION CONSULT NOTE  Pharmacy Consult:  Heparin Indication: chest pain/ACS  Allergies  Allergen Reactions  . Baclofen Other (See Comments)    Possibly stroke like symptoms  . Iodinated Diagnostic Agents Swelling, Rash and Other (See Comments)    Other Reaction: burning to mouth, swelling of lips  . Lipitor [Atorvastatin] Other (See Comments)    Leg pain  . Metoprolol Other (See Comments)    Headaches     Patient Measurements: Height: 5\' 6"  (167.6 cm) Weight: 206 lb 2.1 oz (93.5 kg) IBW/kg (Calculated) : 63.8 Heparin Dosing Weight: 84 kg  Vital Signs: Temp: 98.4 F (36.9 C) (07/20 2319) Temp Source: Oral (07/20 2319) BP: 81/50 (07/20 2319) Pulse Rate: 70 (07/20 2319)  Labs: Recent Labs    02/14/19 1320 02/15/19 0049  HGB 12.1* 11.6*  HCT 37.4* 35.8*  PLT 170 137*  HEPARINUNFRC  --  0.44  CREATININE 8.91* 11.33*  TROPONINIHS 289*  --     Estimated Creatinine Clearance: 7.1 mL/min (A) (by C-G formula based on SCr of 11.33 mg/dL (H)).  Assessment: 63 y.o. male with chest pain for heparin   Goal of Therapy:  Heparin level 0.3-0.7 units/ml Monitor platelets by anticoagulation protocol: Yes   Plan:  Continue Heparin at current rate  Follow up after cath today  Phillis Knack, PharmD, BCPS  02/15/2019, 1:41 AM

## 2019-02-15 NOTE — Progress Notes (Addendum)
Pt voiced to this RN that he "cannot eat a renal diet, and requires regular diet." Paged MD. Explained to pt and educated on importance of renal dt,Will continue to monitor.  Amanda Cockayne, RN

## 2019-02-15 NOTE — H&P (View-Only) (Signed)
Progress Note  Patient Name: Patrick [name] Date of Encounter: 02/15/2019  Primary Cardiologist: Dorris Carnes, MD  EP   [facility] MD    Subjective   No CP today  Breathing is OK    Inpatient Medications    Scheduled Meds: . amLODipine  5 mg Oral [name]  . aspirin  324 mg Oral Once  . aspirin EC  81 mg Oral [name]  . bisoprolol  5 mg Oral [name]  . calcium acetate  2,668 mg Oral Q supper  . calcium-vitamin D  2 tablet Oral Q breakfast  . clopidogrel  75 mg Oral [name]  . ezetimibe  10 mg Oral [name]  . insulin aspart  0-5 Units Subcutaneous QHS  . insulin aspart  0-9 Units Subcutaneous TID WC  . [START ON 02/16/2019] midodrine  10 mg Oral Q M,W,F  . multivitamin  1 tablet Oral [name]  . nitroGLYCERIN  0.5 inch Topical Q6H  . pantoprazole  40 mg Oral [name]  . rosuvastatin  10 mg Oral [name]  . sodium chloride flush  3 mL Intravenous Q12H  . sodium chloride flush  3 mL Intravenous Q12H   Continuous Infusions: . sodium chloride    . sodium chloride    . sodium chloride 10 mL/hr at 02/15/19 3664  . heparin 1,150 Units/hr (02/14/19 1756)   PRN Meds: sodium chloride, sodium chloride, acetaminophen, albuterol, dextromethorphan, docusate sodium, morphine injection, oxyCODONE-acetaminophen, sodium chloride flush, sodium chloride flush   Vital Signs    Vitals:   02/15/19 0542 02/15/19 0700 02/15/19 0817 02/15/19 0900  BP:   (!) 86/11   Pulse:   81   Resp: (!) 21 16 (!) 22 17  Temp:   97.9 F (36.6 C)   TempSrc:   Oral   SpO2:   100%   Weight: 97.3 kg     Height:       No intake or output data in the 24 hours ending 02/15/19 1017 Last 3 Weights 02/15/2019 02/14/2019 12/09/2018  Weight (lbs) 214 lb 9.6 oz 206 lb 2.1 oz 208 lb  Weight (kg) 97.342 kg 93.5 kg 94.348 kg      Telemetry    SR  - Personally Reviewed  ECG    None - Personally Reviewed  Physical Exam   GEN: No acute distress.   Neck: No JVD Cardiac: RRR, no murmurs, rubs, or gallops.  Respiratory: Clear to  auscultation bilaterally. GI: Soft, nontender, non-distended  MS: No edema; No deformity. Neuro:  Nonfocal  Psych: Normal affect   Labs    High Sensitivity Troponin:   Recent Labs  Lab 02/14/19 1320  TROPONINIHS 289*      Cardiac EnzymesNo results for input(s): TROPONINI in the last 168 hours. No results for input(s): TROPIPOC in the last 168 hours.   Chemistry Recent Labs  Lab 02/14/19 1320 02/15/19 0049  NA 135 135  K 3.7 3.8  CL 95* 97*  CO2 25 23  GLUCOSE 86 101*  BUN 28* 39*  CREATININE 8.91* 11.33*  CALCIUM 10.6* 9.4  GFRNONAA 6* 4*  GFRAA 7* 5*  ANIONGAP 15 15     Hematology Recent Labs  Lab 02/14/19 1320 02/15/19 0049  WBC 5.9 5.7  RBC 3.97* 3.80*  HGB 12.1* 11.6*  HCT 37.4* 35.8*  MCV 94.2 94.2  MCH 30.5 30.5  MCHC 32.4 32.4  RDW 17.5* 17.8*  PLT 170 137*    BNPNo results for input(s): BNP, PROBNP in the last 168 hours.  DDimer No results for input(s): DDIMER in the last 168 hours.   Radiology    Dg Chest Portable 1 View  Result Date: 02/14/2019 CLINICAL DATA:  Dialysis.  Chest pressure. EXAM: PORTABLE CHEST 1 VIEW COMPARISON:  10/30/2018. FINDINGS: Left IJ dual-lumen catheter noted with tip over cavoatrial junction. Vascular stent noted over the right upper mediastinum, this appears narrowed. This is unchanged. Prior CABG. Cardiomegaly with normal pulmonary vascularity. Mild left base atelectasis/infiltrate and small left pleural effusion. IMPRESSION: 1.  Dual-lumen left IJ catheter stable position. 2.  Prior CABG.  Stable cardiomegaly. 3. Mild left base atelectasis/infiltrate and small left pleural effusion. Electronically Signed   By: Marcello Moores  Register   On: 02/14/2019 12:39    Cardiac Studies   Echo 08/20/2018 Study Conclusions  - Left ventricle: The cavity size was normal. Wall thickness was increased in a pattern of mild LVH. Systolic function was normal. The estimated ejection fraction was in the range of 50% to 55%. Wall  motion was normal; there were no regional wall motion abnormalities. Doppler parameters are consistent with abnormal left ventricular relaxation (grade 1 diastolic dysfunction). Doppler parameters are consistent with high ventricular filling pressure. - Aortic valve: There was mild stenosis. There was trivial regurgitation. - Mitral valve: Severely calcified annulus.  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  Total occlusion of the proximal to mid LAD filling late by left to left and right to left collaterals.First diagonal contains 50% proximal narrowing.  Left main trunk is normal.  Circumflex gives origin to 3 obtuse marginal branches. The first obtuse marginal contains 50% proximal narrowing.  Right coronary artery is dominant giving PDA and left ventricular branch. Diffuse calcified 60% stenosis is noted throughout the mid vessel ending in the distal segment. No high-grade focal obstruction is seen.  Left ventriculogram and LV pressures were not recorded.  RECOMMENDATIONS:   Angina is related to total occlusion of the proximal to mid LAD. LIMA to the LAD is atretic. 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  Discussed with patient.     Patient Profile     63 y.o. 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 Regenia Skeeter.   Assessment & Plan    1  Chest pain   Pt with signif CAD   Seen in May 2019 with cath   Admitted in April   Cath recomm  Pt refused at time    Now present with worsening CP despited increase in medical Rx  Plan for L heart cath today  To redefine anatomy     2  HTN  BP soft today  Stop amlodipine   (may have been given )  Follow BP   If running low at home may be causing problems    3  ESRD  Gets HD MWF   Last done yesterday  4  HL  LDL 95  Crestor just increased   WIll need to be followed  For questions or updates, please contact [place] Please consult [email or web address] for contact info under        Signed, Dorris Carnes, MD  02/15/2019, 10:17 AM

## 2019-02-16 ENCOUNTER — Encounter (HOSPITAL_COMMUNITY): Admission: EM | Disposition: A | Discharge status: Home / Self Care | Payer: Self-pay | Attending: Internal Medicine

## 2019-02-16 ENCOUNTER — Encounter (HOSPITAL_COMMUNITY): Payer: Self-pay | Admitting: Nephrology

## 2019-02-16 ENCOUNTER — Telehealth: Payer: Medicare Other | Admitting: Physician Assistant

## 2019-02-16 HISTORY — PX: CORONARY ATHERECTOMY: CATH118238

## 2019-02-16 LAB — RENAL FUNCTION PANEL
Albumin: 3.9 g/dL (ref 3.5–5.0)
Anion gap: 19 — ABNORMAL HIGH (ref 5–15)
BUN: 70 mg/dL — ABNORMAL HIGH (ref 8–23)
CO2: 17 mmol/L — ABNORMAL LOW (ref 22–32)
Calcium: 8.6 mg/dL — ABNORMAL LOW (ref 8.9–10.3)
Chloride: 98 mmol/L (ref 98–111)
Creatinine, Ser: 14.71 mg/dL — ABNORMAL HIGH (ref 0.61–1.24)
GFR calc Af Amer: 4 mL/min — ABNORMAL LOW (ref >60)
GFR calc non Af Amer: 3 mL/min — ABNORMAL LOW (ref >60)
Glucose, Bld: 117 mg/dL — ABNORMAL HIGH (ref 70–99)
Phosphorus: 4.9 mg/dL — ABNORMAL HIGH (ref 2.5–4.6)
Potassium: 4.7 mmol/L (ref 3.5–5.1)
Sodium: 134 mmol/L — ABNORMAL LOW (ref 135–145)

## 2019-02-16 LAB — CBC
HCT: 36 % — ABNORMAL LOW (ref 39.0–52.0)
Hemoglobin: 11.5 g/dL — ABNORMAL LOW (ref 13.0–17.0)
MCH: 30.7 pg (ref 26.0–34.0)
MCHC: 31.9 g/dL (ref 30.0–36.0)
MCV: 96 fL (ref 80.0–100.0)
Platelets: 128 10*3/uL — ABNORMAL LOW (ref 150–400)
RBC: 3.75 MIL/uL — ABNORMAL LOW (ref 4.22–5.81)
RDW: 17.8 % — ABNORMAL HIGH (ref 11.5–15.5)
WBC: 8.5 10*3/uL (ref 4.0–10.5)
nRBC: 0 % (ref 0.0–0.2)

## 2019-02-16 LAB — GLUCOSE, CAPILLARY
Glucose-Capillary: 124 mg/dL — ABNORMAL HIGH (ref 70–99)
Glucose-Capillary: 106 mg/dL — ABNORMAL HIGH (ref 70–99)
Glucose-Capillary: 252 mg/dL — ABNORMAL HIGH (ref 70–99)

## 2019-02-16 LAB — HEPARIN LEVEL (UNFRACTIONATED): Heparin Unfractionated: 0.41 IU/mL (ref 0.30–0.70)

## 2019-02-16 LAB — POCT ACTIVATED CLOTTING TIME
Activated Clotting Time: 263 seconds
Activated Clotting Time: 202 seconds
Activated Clotting Time: 213 seconds
Activated Clotting Time: 186 seconds
Activated Clotting Time: 175 seconds

## 2019-02-16 SURGERY — CORONARY ATHERECTOMY
Anesthesia: LOCAL

## 2019-02-16 MED ORDER — LIDOCAINE HCL (PF) 1 % IJ SOLN
INTRAMUSCULAR | Status: DC | PRN
  Administered 2019-02-16: 16 mL via INTRADERMAL

## 2019-02-16 MED ORDER — MIDAZOLAM HCL 2 MG/2ML IJ SOLN
INTRAMUSCULAR | Status: DC | PRN
  Administered 2019-02-16 (×2): 1 mg via INTRAVENOUS

## 2019-02-16 MED ORDER — CHLORHEXIDINE GLUCONATE CLOTH 2 % EX PADS
6.0000 | MEDICATED_PAD | Freq: Every day | CUTANEOUS | Status: DC
  Administered 2019-02-17: 06:00:00 6 via TOPICAL

## 2019-02-16 MED ORDER — SODIUM CHLORIDE 0.9 % IV SOLN
INTRAVENOUS | Status: AC | PRN
  Administered 2019-02-16: 10 mL/h via INTRAVENOUS

## 2019-02-16 MED ORDER — SODIUM CHLORIDE 0.9% FLUSH: 3.0000 mL | INTRAVENOUS | Status: DC | PRN

## 2019-02-16 MED ORDER — SODIUM CHLORIDE 0.9% FLUSH
3.0000 mL | Freq: Two times a day (BID) | INTRAVENOUS | Status: DC
  Administered 2019-02-16: 3 mL via INTRAVENOUS

## 2019-02-16 MED ORDER — LIDOCAINE HCL (PF) 1 % IJ SOLN
INTRAMUSCULAR | Status: AC
  Filled 2019-02-16: qty 30

## 2019-02-16 MED ORDER — DIPHENHYDRAMINE HCL 50 MG/ML IJ SOLN
25.0000 mg | Freq: Once | INTRAMUSCULAR | Status: AC
  Administered 2019-02-16: 08:00:00 25 mg via INTRAVENOUS
  Filled 2019-02-16: qty 1

## 2019-02-16 MED ORDER — BIVALIRUDIN BOLUS VIA INFUSION - CUPID
INTRAVENOUS | Status: DC | PRN
  Administered 2019-02-16: 72.975 mg via INTRAVENOUS

## 2019-02-16 MED ORDER — IOHEXOL 350 MG/ML SOLN
INTRAVENOUS | Status: DC | PRN
  Administered 2019-02-16: 140 mL via INTRA_ARTERIAL

## 2019-02-16 MED ORDER — NITROGLYCERIN 1 MG/10 ML FOR IR/CATH LAB
INTRA_ARTERIAL | Status: AC
  Filled 2019-02-16: qty 10

## 2019-02-16 MED ORDER — HEPARIN (PORCINE) IN NACL 1000-0.9 UT/500ML-% IV SOLN
INTRAVENOUS | Status: AC
  Filled 2019-02-16: qty 1000

## 2019-02-16 MED ORDER — ACETAMINOPHEN 325 MG PO TABS: 650.0000 mg | ORAL_TABLET | ORAL | Status: DC | PRN

## 2019-02-16 MED ORDER — FENTANYL CITRATE (PF) 100 MCG/2ML IJ SOLN
INTRAMUSCULAR | Status: AC
  Filled 2019-02-16: qty 2

## 2019-02-16 MED ORDER — HEPARIN (PORCINE) IN NACL 1000-0.9 UT/500ML-% IV SOLN
INTRAVENOUS | Status: DC | PRN
  Administered 2019-02-16 (×2): 500 mL

## 2019-02-16 MED ORDER — MIDAZOLAM HCL 2 MG/2ML IJ SOLN
INTRAMUSCULAR | Status: AC
  Filled 2019-02-16: qty 2

## 2019-02-16 MED ORDER — FENTANYL CITRATE (PF) 100 MCG/2ML IJ SOLN
INTRAMUSCULAR | Status: DC | PRN
  Administered 2019-02-16: 50 ug via INTRAVENOUS
  Administered 2019-02-16: 25 ug via INTRAVENOUS

## 2019-02-16 MED ORDER — SODIUM CHLORIDE 0.9 % IV SOLN
INTRAVENOUS | Status: DC | PRN
  Administered 2019-02-16: 10:00:00 0.25 mg/kg/h via INTRAVENOUS

## 2019-02-16 MED ORDER — METHYLPREDNISOLONE SODIUM SUCC 125 MG IJ SOLR
125.0000 mg | Freq: Once | INTRAMUSCULAR | Status: AC
  Administered 2019-02-16: 125 mg via INTRAVENOUS
  Filled 2019-02-16: qty 2

## 2019-02-16 MED ORDER — BIVALIRUDIN TRIFLUOROACETATE 250 MG IV SOLR
INTRAVENOUS | Status: AC
  Filled 2019-02-16: qty 250

## 2019-02-16 MED ORDER — SODIUM CHLORIDE 0.9 % IV SOLN: 250.0000 mL | INTRAVENOUS | Status: DC | PRN

## 2019-02-16 MED ORDER — NITROGLYCERIN 1 MG/10 ML FOR IR/CATH LAB
INTRA_ARTERIAL | Status: DC | PRN
  Administered 2019-02-16: 100 ug via INTRACORONARY

## 2019-02-16 SURGICAL SUPPLY — 27 items
BALLN SAPPHIRE 2.5X12 (BALLOONS) ×2
BALLN SAPPHIRE 2.5X15 (BALLOONS) ×2
BALLN SAPPHIRE ~~LOC~~ 3.0X18 (BALLOONS) ×2 IMPLANT
BALLN SAPPHIRE ~~LOC~~ 3.5X15 (BALLOONS) ×1 IMPLANT
BALLOON SAPPHIRE 2.5X12 (BALLOONS) ×1 IMPLANT
BALLOON SAPPHIRE 2.5X15 (BALLOONS) ×1 IMPLANT
CABLE ADAPT PACING TEMP 12FT (ADAPTER) ×2 IMPLANT
CATH LAUNCHER 6FR AL.75 (CATHETERS) ×2 IMPLANT
CATH S G BIP PACING (CATHETERS) ×2 IMPLANT
CATH TELEPORT (CATHETERS) ×2 IMPLANT
CATH VISTA GUIDE 6FR AL1 (CATHETERS) ×2 IMPLANT
CROWN DIAMONDBACK CLASSIC 1.25 (BURR) ×2 IMPLANT
ELECT DEFIB PAD ADLT CADENCE (PAD) ×2 IMPLANT
GUIDELINER 6F (CATHETERS) ×2 IMPLANT
KIT HEART LEFT (KITS) ×2 IMPLANT
LUBRICANT VIPERSLIDE CORONARY (MISCELLANEOUS) ×2 IMPLANT
PACK CARDIAC CATHETERIZATION (CUSTOM PROCEDURE TRAY) ×2 IMPLANT
SHEATH PINNACLE 6F 10CM (SHEATH) ×4 IMPLANT
SHEATH PROBE COVER 6X72 (BAG) ×2 IMPLANT
SLEEVE REPOSITIONING LENGTH 30 (MISCELLANEOUS) ×2 IMPLANT
STENT SYNERGY DES 2.75X38 (Permanent Stent) ×1 IMPLANT
STENT SYNERGY DES 3X24 (Permanent Stent) ×1 IMPLANT
TRANSDUCER W/STOPCOCK (MISCELLANEOUS) ×2 IMPLANT
TUBING CIL FLEX 10 FLL-RA (TUBING) ×2 IMPLANT
WIRE ASAHI PROWATER 300CM (WIRE) ×2 IMPLANT
WIRE EMERALD 3MM-J .035X150CM (WIRE) ×2 IMPLANT
WIRE VIPERWIRE COR FLEX .012 (WIRE) ×2 IMPLANT

## 2019-02-16 NOTE — Plan of Care (Signed)
  Problem: Education: Goal: Knowledge of disease and its progression will improve Outcome: Progressing Goal: Individualized Educational Video(s) Outcome: Progressing   Problem: Fluid Volume: Goal: Compliance with measures to maintain balanced fluid volume will improve Outcome: Progressing   Problem: Health Behavior/Discharge Planning: Goal: Ability to manage health-related needs will improve Outcome: Progressing   Problem: Nutritional: Goal: Ability to make healthy dietary choices will improve Outcome: Progressing   Problem: Clinical Measurements: Goal: Complications related to the disease process, condition or treatment will be avoided or minimized Outcome: Progressing   Problem: Education: Goal: Understanding of cardiac disease, CV risk reduction, and recovery process will improve Outcome: Progressing Goal: Individualized Educational Video(s) Outcome: Progressing   Problem: Activity: Goal: Ability to tolerate increased activity will improve Outcome: Progressing   Problem: Cardiac: Goal: Ability to achieve and maintain adequate cardiovascular perfusion will improve Outcome: Progressing   Problem: Health Behavior/Discharge Planning: Goal: Ability to safely manage health-related needs after discharge will improve Outcome: Progressing   Problem: Education: Goal: Knowledge of General Education information will improve Description: Including pain rating scale, medication(s)/side effects and non-pharmacologic comfort measures Outcome: Progressing   Problem: Health Behavior/Discharge Planning: Goal: Ability to manage health-related needs will improve Outcome: Progressing   Problem: Clinical Measurements: Goal: Ability to maintain clinical measurements within normal limits will improve Outcome: Progressing Goal: Will remain free from infection Outcome: Progressing Goal: Diagnostic test results will improve Outcome: Progressing Goal: Respiratory complications will  improve Outcome: Progressing Goal: Cardiovascular complication will be avoided Outcome: Progressing   Problem: Activity: Goal: Risk for activity intolerance will decrease Outcome: Progressing   Problem: Nutrition: Goal: Adequate nutrition will be maintained Outcome: Progressing   Problem: Coping: Goal: Level of anxiety will decrease Outcome: Progressing   Problem: Elimination: Goal: Will not experience complications related to bowel motility Outcome: Progressing Goal: Will not experience complications related to urinary retention Outcome: Progressing   Problem: Pain Managment: Goal: General experience of comfort will improve Outcome: Progressing   Problem: Safety: Goal: Ability to remain free from injury will improve Outcome: Progressing   Problem: Skin Integrity: Goal: Risk for impaired skin integrity will decrease Outcome: Progressing   Problem: Education: Goal: Understanding of CV disease, CV risk reduction, and recovery process will improve Outcome: Progressing Goal: Individualized Educational Video(s) Outcome: Progressing   Problem: Activity: Goal: Ability to return to baseline activity level will improve Outcome: Progressing   Problem: Cardiovascular: Goal: Ability to achieve and maintain adequate cardiovascular perfusion will improve Outcome: Progressing Goal: Vascular access site(s) Level 0-1 will be maintained Outcome: Progressing   Problem: Health Behavior/Discharge Planning: Goal: Ability to safely manage health-related needs after discharge will improve Outcome: Progressing

## 2019-02-16 NOTE — Interval H&P Note (Signed)
History and Physical Interval Note:  02/16/2019 9:31 AM  Patrick [name]  has presented today for surgery, with the diagnosis of CAD.  The various methods of treatment have been discussed with the patient and family. After consideration of risks, benefits and other options for treatment, the patient has consented to  Procedure(s): CORONARY ATHERECTOMY (N/A) as a surgical intervention.  The patient's history has been reviewed, patient examined, no change in status, stable for surgery.  I have reviewed the patient's chart and labs.  Questions were answered to the patient's satisfaction.   Cath Lab Visit (complete for each Cath Lab visit)  Clinical Evaluation Leading to the Procedure:   ACS: Yes.    Non-ACS:    Anginal Classification: CCS IV  Anti-ischemic medical therapy: Maximal Therapy (2 or more classes of medications)  Non-Invasive Test Results: No non-invasive testing performed  Prior CABG: Previous CABG       Patrick [name] [facility] 02/16/2019 9:31 AM

## 2019-02-16 NOTE — Consult Note (Signed)
Renal Service Consult Note Patrick [name] 02/16/2019 [name] Requesting Physician:  Dr Broadus John  Reason for Consult:  ESRD pt w/ CAD  HPI: The patient is a 63 y.o. year-old with hx of HTN, DM2, MI, gout, atrial flutter , sp Cabg 1997 (anomalous L main off RCA), failed renal Tx (2009) admitted for chest pain and had LHC yest which showed RCA disease.  Going for PCI today, asked to see for ESRD.  Gets HD MWF schedule.   Post cath pt is awake and alert, no CP or SOB, no LE swelling, no HD issues. Was on PD and failed was changed to HD in April 2020.  Has TDC.  Started HD in 2003, had Tx from 2009- 2016.     ROS  denies CP  no joint pain   no HA  no blurry vision  no rash  no diarrhea  no nausea/ vomiting   Past Medical History  Past Medical History:  Diagnosis Date  . Arthritis   . Atrial flutter ([place])    Ablation 2018  . 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 Erectile dysfunction   . ESRD (end stage renal disease) on dialysis ([place])    Pt on HD 2003 >> transplanted in 2009, back on HD in 2016. [name] TTS.   . Essential hypertension   . Gout   . Hernia of abdominal wall   . History of blood transfusion   . History of cardiomyopathy   . History of kidney stones   . History of pneumonia   . Migraine   . Myocardial infarction ([place])    1996  . Pneumonia   . Secondary hyperparathyroidism ([place])   . Type 2 diabetes mellitus ([place])   . Wears glasses    Past Surgical History  Past Surgical History:  Procedure Laterality Date  . A-FLUTTER ABLATION N/A 09/24/2016   Procedure: A-Flutter Ablation;  Surgeon: Will Meredith Leeds, MD;  Location: [place] [facility];  Service: Cardiovascular;  Laterality: N/A;  . ABDOMINAL AORTOGRAM W/LOWER EXTREMITY N/A 04/13/2017   Procedure:  ABDOMINAL AORTOGRAM W/LOWER EXTREMITY;  Surgeon: Conrad Vermillion, MD;  Location: [place] [facility];  Service: Cardiovascular;  Laterality: N/A;  Bilater lower extermity  . APPENDECTOMY    . AV FISTULA PLACEMENT Right 09/18/2014   Procedure: INSERTION OF ARTERIOVENOUS (AV) GORE-TEX GRAFT ARM USING 4-7MM  X 45CM STRETCH GORE-TEX VASCULAR GRAFT;  Surgeon: Rosetta Posner, MD;  Location: [place];  Service: Vascular;  Laterality: Right;  . AV FISTULA PLACEMENT Left 07/07/2016   Procedure: INSERTION OF LEFT BRACHIAL TO AXILLARY ARTERIOVENOUS (AV) GORE-TEX ARM GRAFT;  Surgeon: Conrad Haviland, MD;  Location: [place];  Service: Vascular;  Laterality: Left;  . AV FISTULA PLACEMENT Left 12/09/2018   Procedure: Insertion Of Arteriovenous (Av) Gore-Tex Graft Arm, left arm;  Surgeon: Marty Heck, MD;  Location: [place];  Service: Vascular;  Laterality: Left;  . CARDIAC CATHETERIZATION  ~ 2016  . COLONOSCOPY    . 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 EXCHANGE OF A DIALYSIS CATHETER Left 01/11/2018   Procedure: EXCHANGE OF TUNNELED DIALYSIS CATHETER;  Surgeon: Rosetta Posner, MD;  Location: [place];  Service: Vascular;  Laterality: Left;  .  HERNIA REPAIR  2017   with nephrectomy  . INSERTION OF DIALYSIS CATHETER N/A 10/08/2017   Procedure: INSERTION OF TUNNELED DIALYSIS CATHETER;  Surgeon: Conrad [place], MD;  Location: [place];  Service: Vascular;  Laterality: N/A;  . IR FLUORO GUIDE CV LINE LEFT  03/12/2018  . IR GENERIC HISTORICAL  05/11/2016   IR FLUORO GUIDE CV LINE LEFT 05/11/2016 Corrie Mckusick, DO [facility]  . IR GENERIC HISTORICAL  05/11/2016   IR US GUIDE VASC ACCESS LEFT 05/11/2016 Corrie Mckusick, DO [facility]  . IR GENERIC HISTORICAL  05/11/2016   IR US GUIDE VASC ACCESS RIGHT 05/11/2016 Corrie Mckusick, DO [facility]  . IR GENERIC HISTORICAL  05/11/2016   IR RADIOLOGY PERIPHERAL GUIDED IV START 05/11/2016 Corrie Mckusick, DO  [facility]  . KIDNEY TRANSPLANT  2009  . LEFT HEART CATH AND CORONARY ANGIOGRAPHY N/A 02/15/2019   Procedure: LEFT HEART CATH AND CORONARY ANGIOGRAPHY;  Surgeon: Martinique, Peter M, MD;  Location: [place] [facility];  Service: Cardiovascular;  Laterality: N/A;  . LEFT HEART CATH AND CORS/GRAFTS ANGIOGRAPHY N/A 12/04/2017   Procedure: LEFT HEART CATH AND CORS/GRAFTS ANGIOGRAPHY;  Surgeon: Belva Crome, MD;  Location: [place] [facility];  Service: Cardiovascular;  Laterality: N/A;  . NEPHRECTOMY  2017   transplant rejected   . PERIPHERAL VASCULAR CATHETERIZATION N/A 06/04/2016   Procedure: Upper Extremity Venography;  Surgeon: Waynetta Sandy, MD;  Location: [place] [facility];  Service: Cardiovascular;  Laterality: N/A;  . PERIPHERAL VASCULAR INTERVENTION  04/13/2017   Procedure: PERIPHERAL VASCULAR INTERVENTION;  Surgeon: Conrad [place], MD;  Location: [place] [facility];  Service: Cardiovascular;;  Lt. Common/Exernal  Iliac  . THROMBECTOMY AND REVISION OF ARTERIOVENTOUS (AV) GORETEX  GRAFT Left 10/08/2017   Procedure: THROMBECTOMY of ARTERIOVENTOUS (AV) GORETEX  GRAFT LEFT UPPER ARM;  Surgeon: Conrad [place], MD;  Location: [place];  Service: Vascular;  Laterality: Left;  . THROMBECTOMY W/ EMBOLECTOMY Left 09/14/2017   Procedure: THROMBECTOMY ARTERIOVENOUS GORE-TEX GRAFT LEFT UPPER ARM;  Surgeon: Rosetta Posner, MD;  Location: [place];  Service: Vascular;  Laterality: Left;  . UPPER EXTREMITY ANGIOGRAPHY Bilateral 10/29/2018   Procedure: UPPER EXTREMITY ANGIOGRAPHY;  Surgeon: Marty Heck, MD;  Location: [place] [facility];  Service: Cardiovascular;  Laterality: Bilateral;  . UPPER EXTREMITY VENOGRAPHY N/A 11/16/2017   Procedure: UPPER EXTREMITY VENOGRAPHY - Right Arm;  Surgeon: Conrad Redkey, MD;  Location: [place] [facility];  Service: Cardiovascular;  Laterality: N/A;  . UPPER EXTREMITY VENOGRAPHY Bilateral 10/29/2018   Procedure: UPPER EXTREMITY VENOGRAPHY;  Surgeon: Marty Heck,  MD;  Location: [place] [facility];  Service: Cardiovascular;  Laterality: Bilateral;   Family History  Family History  Problem Relation Age of Onset  . Hyperlipidemia Mother   . Hypertension Mother   . HIV Sister    Social History  reports that he quit smoking about 4 years ago. His smoking use included cigarettes. He has never used smokeless tobacco. He reports current alcohol use. He reports that he does not use drugs. Allergies  Allergies  Allergen Reactions  . Baclofen Other (See Comments)    Possibly stroke like symptoms  . Iodinated Diagnostic Agents Swelling, Rash and Other (See Comments)    Other Reaction: burning to mouth, swelling of lips  . Lipitor [Atorvastatin] Other (See Comments)    Leg pain  . Metoprolol Other (See Comments)    Headaches    Home medications Prior to Admission medications  Medication Sig Start Date End Date Taking? Authorizing Provider  acetaminophen (TYLENOL) 500 MG tablet Take 1,000 mg by mouth every 6 (six) hours as needed for moderate pain or headache.   Yes [provider]  albuterol (PROVENTIL HFA;VENTOLIN HFA) 108 (90 Base) MCG/ACT inhaler Inhale 1-2 puffs into the lungs every 6 (six) hours as needed for wheezing or shortness of breath. Patient taking differently: Inhale 2 puffs into the lungs every 6 (six) hours as needed for wheezing or shortness of breath.  12/18/17  Yes Weaver, Scott T, PA-C  amLODipine (NORVASC) 5 MG tablet TAKE 1 TABLET BY MOUTH DAILY Patient taking differently: Take 5 mg by mouth daily.  12/10/18  Yes Belva Crome, MD  aspirin EC 81 MG tablet Take 81 mg by mouth daily.    Yes [provider]  bisoprolol (ZEBETA) 5 MG tablet Take 1 tablet (5 mg total) by mouth daily. 10/06/18  Yes Eugenie Filler, MD  calcium acetate (PHOSLO) 667 MG capsule Take 2,668 mg by mouth daily with supper.    Yes [provider]  Calcium Carbonate-Vitamin D (CALCIUM 500/D PO) Take 3 tablets by mouth daily.    Yes  [provider]  clopidogrel (PLAVIX) 75 MG tablet Take 1 tablet (75 mg total) by mouth daily. 12/18/17  Yes Weaver, Scott T, PA-C  Colchicine 0.6 MG CAPS Take 0.6 mg by mouth See admin instructions. Take 0.6 mg when first symptoms of gout flare occur, then take another 0.6 mg 14 days later as needed for gout 10/13/18  Yes [provider]  dextromethorphan (DELSYM) 30 MG/5ML liquid Take 30 mg by mouth as needed for cough.   Yes [provider]  docusate sodium (COLACE) 100 MG capsule Take 100 mg by mouth daily as needed for mild constipation.   Yes [provider]  ezetimibe (ZETIA) 10 MG tablet Take 10 mg by mouth daily.    Yes [provider]  guaiFENesin (MUCINEX) 600 MG 12 hr tablet Take 600 mg by mouth 2 (two) times daily as needed for cough.   Yes [provider]  isosorbide mononitrate (IMDUR) 60 MG 24 hr tablet Take 1 tablet (60 mg total) by mouth daily. 11/08/18 02/14/19 Yes Bhagat, Crista Luria, PA  midodrine (PROAMATINE) 10 MG tablet Take 10 mg by mouth every Monday, Wednesday, and Friday. Dialysis days only 02/07/19  Yes [provider]  multivitamin (RENA-VIT) TABS tablet Take 1 tablet by mouth daily.    Yes [provider]  pantoprazole (PROTONIX) 40 MG tablet Take 1 tablet (40 mg total) by mouth daily. 11/08/18  Yes Bhagat, Bhavinkumar, PA  repaglinide (PRANDIN) 0.5 MG tablet Take 1 tablet (0.5 mg total) by mouth 2 (two) times daily before a meal. 04/05/18  Yes Renato Shin, MD  rosuvastatin (CRESTOR) 10 MG tablet Take 10 mg by mouth daily.    Yes [provider]  oxyCODONE-acetaminophen (PERCOCET/ROXICET) 5-325 MG tablet Take 1 tablet by mouth every 6 (six) hours as needed. 12/09/18   Ulyses Amor, PA-C   Liver Function Tests Recent Labs  Lab 02/16/19 [phone]  ALBUMIN 3.9   No results for input(s): LIPASE, AMYLASE in the last 168 hours. CBC Recent Labs  Lab 02/14/19 1320 02/15/19 0049 02/16/19 0734   WBC 5.9 5.7 8.5  NEUTROABS 3.7  --   --   HGB 12.1* 11.6* 11.5*  HCT 37.4* 35.8* 36.0*  MCV 94.2 94.2 96.0  PLT 170 137* 614*   Basic Metabolic Panel Recent Labs  Lab 02/14/19 1320 02/15/19 0049 02/16/19 0734  NA 135 135 134*  K 3.7 3.8 4.7  CL 95* 97* 98  CO2 25 23 17*  GLUCOSE 86 101* 117*  BUN 28* 39* 70*  CREATININE 8.91* 11.33* 14.71*  CALCIUM 10.6* 9.4 8.6*  PHOS  --   --  4.9*   Iron/TIBC/Ferritin/ %Sat    Component Value Date/Time   IRON 25 (L) 10/27/2018 1950   TIBC 143 (L) 10/27/2018 1950   FERRITIN 1,613 (H) 10/27/2018 1950   IRONPCTSAT 18 10/27/2018 1950    Vitals:   02/16/19 1500 02/16/19 1505 02/16/19 1510 02/16/19 1515  BP: (!) 92/40 (!) 109/27 (!) 94/21 (!) 100/42  Pulse:      Resp: 15 16 17 20   Temp:      TempSrc:      SpO2:      Weight:      Height:        Exam Gen alert, no distress, lying flat post LHC No rash, cyanosis or gangrene Sclera anicteric, throat clear  No jvd or bruits Chest clear bilat to bases RRR no MRG Abd soft ntnd no mass or ascites +bs GU normal male MS no joint effusions or deformity Ext no LE edema, no wounds or ulcers Neuro is alert, Ox 3 , nf    Home meds:  - amlodipine 5/ bisoprolol 5 qd/ isosorbide mono 60 qd/ midodrine 10 pre hd mwf  - rosuvastatin 10/  ezetimibe 10 / aspirin 81/ clopidogrel 75  - calc acetate ac tid/ pantoprazole 40/ colchicine prn    Outpt HD: [facility] [place]  4h  400/800  96.5kg  2/3.5 bath  Hep 3000  TDC  - venofer 50 /wk     Assessment/ Plan: 1. CAD - sp PCI to RCA today 2. ESRD - on HD MWF. Will postpone HD to tomorrow am, pt's lytes and volume are stable.  3. HTN - cont meds  4. Anemia ckd - no need esa at this time 5. H/o failed renal transplant      Kelly Splinter  MD 02/16/2019, 4:25 PM

## 2019-02-16 NOTE — Progress Notes (Signed)
PROGRESS NOTE  IVA POSTEN DVV:[id number] DOB: January 27, 1956   PCP: Nolene Ebbs, MD  Patient is from: Home  DOA: 02/14/2019 LOS: 2  Brief Narrative / Interim history: 63 year old male with history of CAD/CABG in 1997, [place] in 2019, ESRD on HD MWF, HTN, atrial flutter status post ablation, DM-2 and diastolic CHF presenting with chest pain with associated diaphoresis and dyspnea.  Initial high-sensitivity troponin elevated to 289.  EKG showed T wave changes in lateral leads although present on old EKG.  Cardiology consulted.  He underwent left heart catheterization that revealed high-grade CAD in mid RCA and moderate CAD in distal RCA and 100% occlusion in proximal LAD.  Cardiology to take him back to Cath Lab for arthrectomy and stenting of the RCA on 7/22.  Subjective: -Feels okay, had mild chest tightness earlier this morning when he stood up to brush his teeth, subsided when he sat back down  Assessment & Plan:  Chest pain/acute coronary syndrome -CAD/history of CABG in 1997: Chest pain resolved. -High-sensitivity troponin elevated to 289 -LHC on 7/21 high-grade CAD in mid RCA and moderate CAD in distal RCA and 100% occlusion in proximal LAD. -Plan for arthrectomy and stenting of RCA today -Continue aspirin, Plavix, Zetia, Crestor and Zebeta -Per cardiology  ESRD on HD MWF:  -Nephrology following, plan for HD today post-cath  Anemia of renal disease/BMD: Stable -Per nephrology  Chronic diastolic CHF -Echo in 12/2692 with EF of 50 to 55%, G1 DD and severely calcified MV annulus. -Volume managed with HD  History of atrial flutter status post ablation -Zebeta as above.  chronic hypotension -Continue midodrine  NIDDM-2: On Prandin at home. -Oral hypoglycemics on hold -CBG stable, continue sensitive sliding scale  DVT prophylaxis: Subcu heparin Code Status: Full code Family Communication: No family at bedside Disposition Plan: Remains inpatient for acute coronary  syndrome.  Plan for coronary arthrectomy and stent placement on 7/22 Consultants: Cardiology, nephrology  Objective: Vitals:   02/16/19 1300 02/16/19 1410 02/16/19 1415 02/16/19 1420  BP: (!) 85/36 (!) 98/22 (!) 87/29 (!) 91/52  Pulse: 63 (!) 56 (!) 59 (!) 50  Resp: 17 12 12 12   Temp:      TempSrc:      SpO2: 97% 98% 97% 97%  Weight:      Height:        Intake/Output Summary (Last 24 hours) at 02/16/2019 1446 Last data filed at 02/16/2019 0800 Gross per 24 hour  Intake 759.97 ml  Output 0 ml  Net 759.97 ml   Filed Weights   02/14/19 1600 02/15/19 0542  Weight: 93.5 kg 97.3 kg    Examination:  Gen: Awake, Alert, Oriented X 3, no distress HEENT: PERRLA, Neck supple, no JVD Lungs: Good air movement bilaterally, CTAB CVS: RRR,No Gallops,Rubs or new Murmurs Abd: soft, Non tender, non distended, BS present Extremities: No edema, left arm AV graft Skin: no new rashes PSYCH: Calm. Normal affect.  HD cath over left chest.  I have personally reviewed the following labs and images:  Radiology Studies: No results found.  Microbiology: Recent Results (from the past 240 hour(s))  SARS Coronavirus 2 (CEPHEID - Performed in [place] hospital lab), Hosp Order     Status: None   Collection Time: 02/14/19  1:52 PM   Specimen: Nasopharyngeal Swab  Result Value Ref Range Status   SARS Coronavirus 2 NEGATIVE NEGATIVE Final    Comment: (NOTE) If result is NEGATIVE SARS-CoV-2 target nucleic acids are NOT DETECTED. The SARS-CoV-2 RNA is generally  detectable in upper and lower  respiratory specimens during the acute phase of infection. The lowest  concentration of SARS-CoV-2 viral copies this assay can detect is 250  copies / mL. A negative result does not preclude SARS-CoV-2 infection  and should not be used as the sole basis for treatment or other  patient management decisions.  A negative result may occur with  improper specimen collection / handling, submission of specimen  other  than nasopharyngeal swab, presence of viral mutation(s) within the  areas targeted by this assay, and inadequate number of viral copies  (<250 copies / mL). A negative result must be combined with clinical  observations, patient history, and epidemiological information. If result is POSITIVE SARS-CoV-2 target nucleic acids are DETECTED. The SARS-CoV-2 RNA is generally detectable in upper and lower  respiratory specimens dur ing the acute phase of infection.  Positive  results are indicative of active infection with SARS-CoV-2.  Clinical  correlation with patient history and other diagnostic information is  necessary to determine patient infection status.  Positive results do  not rule out bacterial infection or co-infection with other viruses. If result is PRESUMPTIVE POSTIVE SARS-CoV-2 nucleic acids MAY BE PRESENT.   A presumptive positive result was obtained on the submitted specimen  and confirmed on repeat testing.  While 2019 novel coronavirus  (SARS-CoV-2) nucleic acids may be present in the submitted sample  additional confirmatory testing may be necessary for epidemiological  and / or clinical management purposes  to differentiate between  SARS-CoV-2 and other Sarbecovirus currently known to infect humans.  If clinically indicated additional testing with an alternate test  methodology [phone]) is advised. The SARS-CoV-2 RNA is generally  detectable in upper and lower respiratory sp ecimens during the acute  phase of infection. The expected result is Negative. Fact Sheet for Patients:  StrictlyIdeas.no Fact Sheet for Healthcare Providers: BankingDealers.co.za This test is not yet approved or cleared by the [place] FDA and has been authorized for detection and/or diagnosis of SARS-CoV-2 by FDA under an Emergency Use Authorization (EUA).  This EUA will remain in effect (meaning this test can be used) for the duration  of the COVID-19 declaration under Section 564(b)(1) of the Act, 21 U.S.C. section 360bbb-3(b)(1), unless the authorization is terminated or revoked sooner. Performed at [facility], [place] [address] [place], [place] [address]   MRSA PCR Screening     Status: None   Collection Time: 02/14/19 10:41 PM   Specimen: Nasopharyngeal  Result Value Ref Range Status   MRSA by PCR NEGATIVE NEGATIVE Final    Comment:        The GeneXpert MRSA Assay (FDA approved for NASAL specimens only), is one component of a comprehensive MRSA colonization surveillance program. It is not intended to diagnose MRSA infection nor to guide or monitor treatment for MRSA infections. Performed at [facility], [place] [address], [place], [place] [address]     Sepsis Labs: Invalid input(s): PROCALCITONIN, LACTICIDVEN  Urine analysis:    Component Value Date/Time   COLORURINE RED (A) 11/17/2015 0910   APPEARANCEUR TURBID (A) 11/17/2015 0910   LABSPEC 1.035 (H) 11/17/2015 0910   PHURINE 6.0 11/17/2015 0910   GLUCOSEU 100 (A) 11/17/2015 0910   HGBUR LARGE (A) 11/17/2015 0910   BILIRUBINUR LARGE (A) 11/17/2015 0910   KETONESUR 40 (A) 11/17/2015 0910   PROTEINUR >300 (A) 11/17/2015 0910   UROBILINOGEN 0.2 11/24/2013 0242   NITRITE POSITIVE (A) 11/17/2015 0910   LEUKOCYTESUR LARGE (A)  11/17/2015 0910    Anemia Panel: No results for input(s): VITAMINB12, FOLATE, FERRITIN, TIBC, IRON, RETICCTPCT in the last 72 hours.  Thyroid Function Tests: No results for input(s): TSH, T4TOTAL, FREET4, T3FREE, THYROIDAB in the last 72 hours.  Lipid Profile: Recent Labs    02/15/19 0049  CHOL 122  HDL 46  LDLCALC 59  TRIG 85  CHOLHDL 2.7    CBG: Recent Labs  Lab 02/15/19 1149 02/15/19 1222 02/15/19 1627 02/15/19 2127 02/16/19 0634  GLUCAP 132* 132* 255* 133* 124*    HbA1C: No results for input(s): HGBA1C in the last 72 hours.  BNP (last 3 results): No results for input(s): PROBNP in the  last 8760 hours.  Cardiac Enzymes: No results for input(s): CKTOTAL, CKMB, CKMBINDEX, TROPONINI in the last 168 hours.  Coagulation Profile: No results for input(s): INR, PROTIME in the last 168 hours.  Liver Function Tests: Recent Labs  Lab 02/16/19 0734  ALBUMIN 3.9   No results for input(s): LIPASE, AMYLASE in the last 168 hours. No results for input(s): AMMONIA in the last 168 hours.  Basic Metabolic Panel: Recent Labs  Lab 02/14/19 1320 02/15/19 0049 02/16/19 0734  NA 135 135 134*  K 3.7 3.8 4.7  CL 95* 97* 98  CO2 25 23 17*  GLUCOSE 86 101* 117*  BUN 28* 39* 70*  CREATININE 8.91* 11.33* 14.71*  CALCIUM 10.6* 9.4 8.6*  PHOS  --   --  4.9*   GFR: Estimated Creatinine Clearance: 5.5 mL/min (A) (by C-G formula based on SCr of 14.71 mg/dL (H)).  CBC: Recent Labs  Lab 02/14/19 1320 02/15/19 0049 02/16/19 0734  WBC 5.9 5.7 8.5  NEUTROABS 3.7  --   --   HGB 12.1* 11.6* 11.5*  HCT 37.4* 35.8* 36.0*  MCV 94.2 94.2 96.0  PLT 170 137* 128*    Procedures:  7/21-left heart catheterization  Microbiology summarized: COVID-19 negative     Antimicrobials: Anti-infectives (From admission, onward)   None      Sch Meds:  Scheduled Meds: . [MAR Hold] aspirin  324 mg Oral Once  . [MAR Hold] aspirin EC  81 mg Oral Daily  . [MAR Hold] calcium acetate  2,668 mg Oral Q supper  . [MAR Hold] calcium-vitamin D  2 tablet Oral Q breakfast  . [MAR Hold] Chlorhexidine Gluconate Cloth  6 each Topical Q0600  . [MAR Hold] clopidogrel  75 mg Oral Daily  . [MAR Hold] ezetimibe  10 mg Oral Daily  . [MAR Hold] insulin aspart  0-5 Units Subcutaneous QHS  . [MAR Hold] insulin aspart  0-9 Units Subcutaneous TID WC  . [MAR Hold] midodrine  10 mg Oral Q M,W,F  . [MAR Hold] multivitamin  1 tablet Oral Daily  . [MAR Hold] nitroGLYCERIN  0.5 inch Topical Q6H  . [MAR Hold] pantoprazole  40 mg Oral Daily  . [MAR Hold] rosuvastatin  10 mg Oral Daily  . [MAR Hold] sodium chloride  flush  3 mL Intravenous Q12H  . [MAR Hold] sodium chloride flush  3 mL Intravenous Q12H   Continuous Infusions: . [MAR Hold] sodium chloride    . sodium chloride    . sodium chloride 10 mL/hr at 02/16/19 0538  . heparin 1,150 Units/hr (02/15/19 2008)   PRN Meds:.[MAR Hold] sodium chloride, sodium chloride, [MAR Hold] acetaminophen, [MAR Hold] albuterol, [MAR Hold] dextromethorphan, [MAR Hold] docusate sodium, [MAR Hold]  morphine injection, [MAR Hold] oxyCODONE-acetaminophen, [MAR Hold] sodium chloride flush, sodium chloride flush   Domenic Polite, MD  [facility]  If 7PM-7AM, please contact night-coverage  02/16/2019, 2:46 PM

## 2019-02-16 NOTE — Progress Notes (Signed)
Site area: Right groin a 6  French arterial and venous sheath was removed  Site Prior to Removal:  Level 0  Pressure Applied For 25 MINUTES    Bedrest Beginning at  1800  Manual:   Yes.    Patient Status During Pull:  stable  Post Pull Groin Site:  Level 0  Post Pull Instructions Given:  Yes.    Post Pull Pulses Present:  Yes.    Dressing Applied:  Yes.    Comments:

## 2019-02-17 ENCOUNTER — Telehealth: Payer: Self-pay

## 2019-02-17 ENCOUNTER — Encounter (HOSPITAL_COMMUNITY): Payer: Self-pay | Admitting: Cardiology

## 2019-02-17 DIAGNOSIS — I5033 Acute on chronic diastolic (congestive) heart failure: Secondary | ICD-10-CM

## 2019-02-17 DIAGNOSIS — D649 Anemia, unspecified: Secondary | ICD-10-CM

## 2019-02-17 LAB — BASIC METABOLIC PANEL
Anion gap: 18 — ABNORMAL HIGH (ref 5–15)
BUN: 94 mg/dL — ABNORMAL HIGH (ref 8–23)
CO2: 20 mmol/L — ABNORMAL LOW (ref 22–32)
Calcium: 7.9 mg/dL — ABNORMAL LOW (ref 8.9–10.3)
Chloride: 95 mmol/L — ABNORMAL LOW (ref 98–111)
Creatinine, Ser: 17.03 mg/dL — ABNORMAL HIGH (ref 0.61–1.24)
GFR calc Af Amer: 3 mL/min — ABNORMAL LOW (ref >60)
GFR calc non Af Amer: 3 mL/min — ABNORMAL LOW (ref >60)
Glucose, Bld: 181 mg/dL — ABNORMAL HIGH (ref 70–99)
Potassium: 5.6 mmol/L — ABNORMAL HIGH (ref 3.5–5.1)
Sodium: 133 mmol/L — ABNORMAL LOW (ref 135–145)

## 2019-02-17 LAB — GLUCOSE, CAPILLARY
Glucose-Capillary: 29 mg/dL — CL (ref 70–99)
Glucose-Capillary: 92 mg/dL (ref 70–99)

## 2019-02-17 LAB — CBC
HCT: 35.1 % — ABNORMAL LOW (ref 39.0–52.0)
Hemoglobin: 11.4 g/dL — ABNORMAL LOW (ref 13.0–17.0)
MCH: 30.8 pg (ref 26.0–34.0)
MCHC: 32.5 g/dL (ref 30.0–36.0)
MCV: 94.9 fL (ref 80.0–100.0)
Platelets: 132 10*3/uL — ABNORMAL LOW (ref 150–400)
RBC: 3.7 MIL/uL — ABNORMAL LOW (ref 4.22–5.81)
RDW: 17.9 % — ABNORMAL HIGH (ref 11.5–15.5)
WBC: 8.1 10*3/uL (ref 4.0–10.5)
nRBC: 0 % (ref 0.0–0.2)

## 2019-02-17 MED ORDER — HEPARIN SODIUM (PORCINE) 1000 UNIT/ML IJ SOLN
INTRAMUSCULAR | Status: AC
  Administered 2019-02-17: 1000 [IU]
  Filled 2019-02-17: qty 4

## 2019-02-17 NOTE — Telephone Encounter (Signed)
**Note De-identified Marckus Hanover Obfuscation** -----  **Note De-Identified Shatarra Wehling Obfuscation** Message from Ledora Bottcher, [place] sent at 02/17/2019 10:51 AM EDT ----- Pt will be a TOC follow up.  Thanks Angie

## 2019-02-17 NOTE — Progress Notes (Signed)
CARDIAC REHAB PHASE I   Pt in the middle of discharge. Pt educated on importance of ASA, Plavix, and NTG. Deferred diet to dialysis center. Reviewed restrictions, site care, and exercise guidelines. Will refer to CRP II [place], pt may not be able to participate due to dialysis schedule. Agreeable to learn more about Virtual Cardiac Rehab.   Pt is interested in participating in Virtual Cardiac Rehab. Pt advised that Virtual Cardiac Rehab is provided at no cost to the patient.  Checklist:  1. Pt has smart device  ie smartphone and/or ipad for downloading an app  Yes 2. Reliable internet/wifi service    Yes 3. Understands how to use their smartphone and navigate within an app.  Yes  Reviewed with pt the scheduling process for virtual cardiac rehab.  Pt verbalized understanding.   [id number] Rufina Falco, RN BSN 02/17/2019 11:48 AM

## 2019-02-17 NOTE — Discharge Summary (Signed)
Physician Discharge Summary  Patrick [name] NKN:[id number] DOB: Aug 03, 1955 DOA: 02/14/2019  PCP: Nolene Ebbs, MD  Admit date: 02/14/2019 Discharge date: 02/17/2019  Time spent: 35 minutes  Recommendations for Outpatient Follow-up:  1. PCP in 1 week   Discharge Diagnoses:   Unstable angina   Secondary hyperparathyroidism ([place])   S/P CABG x 1   Acute on chronic diastolic CHF (congestive heart failure) ([place])   Controlled diabetes mellitus type 2 with complications (H[facility])   ESRD (end stage renal disease) (H[facility])   Chronic anemia   ACS (acute coronary syndrome) ([place])   Unstable angina ([place])   Discharge Condition: Improved  Diet recommendation: Renal, diabetic  Filed Weights   02/17/19 0559 02/17/19 0645 02/17/19 1020  Weight: 98.9 kg 98.9 kg 96.3 kg    History of present illness:  63 year old male with history of CAD/CABG in 1997, [place] in 2019, ESRD on HD MWF, HTN, atrial flutter status post ablation, DM-2 and diastolic CHF presenting with chest pain with associated diaphoresis and dyspnea.  Hospital Course:   Unstable angina/CAD -CAD/history of CABG in 1997: Chest pain resolved. -High-sensitivity troponin was elevated to 289 -Cardiology consulted, underwent left heart catheterization 7/21 Which noted high-grade CAD in mid RCA and moderate CAD in distal RCA and 100% occlusion in proximal LAD. -Subsequently underwent arthrectomy and stenting of RCA yesterday -Clinically improved -Continue aspirin, Plavix, Zetia, Crestor and Zebeta -Discharged home in a stable condition, compliance with above meds emphasized, outpatient follow-up with cardiology to be arranged by [facility]  ESRD on HD MWF:  -Nephrology following, completed dialysis yesterday post catheterization  Anemia of renal disease/BMD: Stable -Per nephrology  Chronic diastolic CHF -Echo in 09/7900 with EF of 50 to 55%, G1 DD and severely calcified MV annulus. -Volume managed with HD  History of atrial  flutter status post ablation -Zebeta as above.  chronic hypotension -Continue midodrine  NIDDM-2: On Prandin at home. -Prandin resumed at discharge  Discharge Exam: Vitals:   02/17/19 0900 02/17/19 1020  BP: (!) 86/39 116/65  Pulse: (!) 59 (!) 54  Resp:  17  Temp:  97.6 F (36.4 C)  SpO2:  100%    General: AAOx3 Cardiovascular: S1S2/RRR Respiratory: CTAB  Discharge Instructions   Discharge Instructions    Amb Referral to Cardiac Rehabilitation   Complete by: As directed    [email or web address]   Diagnosis: Coronary Stents   After initial evaluation and assessments completed: Virtual Based Care may be provided alone or in conjunction with Phase 2 Cardiac Rehab based on patient barriers.: Yes   Diet - low sodium heart healthy   Complete by: As directed    Diet Carb Modified   Complete by: As directed    Increase activity slowly   Complete by: As directed      Allergies as of 02/17/2019      Reactions   Baclofen Other (See Comments)   Possibly stroke like symptoms   Iodinated Diagnostic Agents Swelling, Rash, Other (See Comments)   Other Reaction: burning to mouth, swelling of lips   Lipitor [atorvastatin] Other (See Comments)   Leg pain   Metoprolol Other (See Comments)   Headaches       Medication List    TAKE these medications   acetaminophen 500 MG tablet Commonly known as: TYLENOL Take 1,000 mg by mouth every 6 (six) hours as needed for moderate pain or headache.   albuterol 108 (90 Base) MCG/ACT inhaler Commonly known as: VENTOLIN HFA Inhale 1-2 puffs into the  lungs every 6 (six) hours as needed for wheezing or shortness of breath. What changed: how much to take   amLODipine 5 MG tablet Commonly known as: NORVASC TAKE 1 TABLET BY MOUTH DAILY   aspirin EC 81 MG tablet Take 81 mg by mouth daily.   bisoprolol 5 MG tablet Commonly known as: ZEBETA Take 1 tablet (5 mg total) by mouth daily.   CALCIUM 500/D PO Take 3 tablets by mouth  daily.   calcium acetate 667 MG capsule Commonly known as: PHOSLO Take 2,668 mg by mouth daily with supper.   clopidogrel 75 MG tablet Commonly known as: Plavix Take 1 tablet (75 mg total) by mouth daily.   Colchicine 0.6 MG Caps Take 0.6 mg by mouth See admin instructions. Take 0.6 mg when first symptoms of gout flare occur, then take another 0.6 mg 14 days later as needed for gout   Delsym 30 MG/5ML liquid Generic drug: dextromethorphan Take 30 mg by mouth as needed for cough.   docusate sodium 100 MG capsule Commonly known as: COLACE Take 100 mg by mouth daily as needed for mild constipation.   ezetimibe 10 MG tablet Commonly known as: ZETIA Take 10 mg by mouth daily.   guaiFENesin 600 MG 12 hr tablet Commonly known as: MUCINEX Take 600 mg by mouth 2 (two) times daily as needed for cough.   isosorbide mononitrate 60 MG 24 hr tablet Commonly known as: IMDUR Take 1 tablet (60 mg total) by mouth daily.   midodrine 10 MG tablet Commonly known as: PROAMATINE Take 10 mg by mouth every Monday, Wednesday, and Friday. Dialysis days only   multivitamin Tabs tablet Take 1 tablet by mouth daily.   oxyCODONE-acetaminophen 5-325 MG tablet Commonly known as: PERCOCET/ROXICET Take 1 tablet by mouth every 6 (six) hours as needed.   pantoprazole 40 MG tablet Commonly known as: PROTONIX Take 1 tablet (40 mg total) by mouth daily.   repaglinide 0.5 MG tablet Commonly known as: PRANDIN Take 1 tablet (0.5 mg total) by mouth 2 (two) times daily before a meal.   rosuvastatin 10 MG tablet Commonly known as: CRESTOR Take 10 mg by mouth daily.      Allergies  Allergen Reactions  . Baclofen Other (See Comments)    Possibly stroke like symptoms  . Iodinated Diagnostic Agents Swelling, Rash and Other (See Comments)    Other Reaction: burning to mouth, swelling of lips  . Lipitor [Atorvastatin] Other (See Comments)    Leg pain  . Metoprolol Other (See Comments)    Headaches     Follow-up Information    Nolene Ebbs, MD. Schedule an appointment as soon as possible for a visit in 1 week(s).   Specialty: Internal Medicine Contact information: [place] [address] [phone]        Fay Records, MD .   Specialty: Cardiology Contact information: [place] [address] [phone]        Constance Haw, MD .   Specialty: Cardiology Contact information: [place] [address] [phone]            The results of significant diagnostics from this hospitalization (including imaging, microbiology, ancillary and laboratory) are listed below for reference.    Significant Diagnostic Studies: Dg Chest Portable 1 View  Result Date: 02/14/2019 CLINICAL DATA:  Dialysis.  Chest pressure. EXAM: PORTABLE CHEST 1 VIEW COMPARISON:  10/30/2018. FINDINGS: Left IJ dual-lumen catheter noted with tip  over cavoatrial junction. Vascular stent noted over the right upper mediastinum, this appears narrowed. This is unchanged. Prior CABG. Cardiomegaly with normal pulmonary vascularity. Mild left base atelectasis/infiltrate and small left pleural effusion. IMPRESSION: 1.  Dual-lumen left IJ catheter stable position. 2.  Prior CABG.  Stable cardiomegaly. 3. Mild left base atelectasis/infiltrate and small left pleural effusion. Electronically Signed   By: Marcello Moores  Register   On: 02/14/2019 12:39    Microbiology: Recent Results (from the past 240 hour(s))  SARS Coronavirus 2 (CEPHEID - Performed in [place] hospital lab), Hosp Order     Status: None   Collection Time: 02/14/19  1:52 PM   Specimen: Nasopharyngeal Swab  Result Value Ref Range Status   SARS Coronavirus 2 NEGATIVE NEGATIVE Final    Comment: (NOTE) If result is NEGATIVE SARS-CoV-2 target nucleic acids are NOT DETECTED. The SARS-CoV-2 RNA is generally detectable in upper and lower  respiratory specimens during the acute  phase of infection. The lowest  concentration of SARS-CoV-2 viral copies this assay can detect is 250  copies / mL. A negative result does not preclude SARS-CoV-2 infection  and should not be used as the sole basis for treatment or other  patient management decisions.  A negative result may occur with  improper specimen collection / handling, submission of specimen other  than nasopharyngeal swab, presence of viral mutation(s) within the  areas targeted by this assay, and inadequate number of viral copies  (<250 copies / mL). A negative result must be combined with clinical  observations, patient history, and epidemiological information. If result is POSITIVE SARS-CoV-2 target nucleic acids are DETECTED. The SARS-CoV-2 RNA is generally detectable in upper and lower  respiratory specimens dur ing the acute phase of infection.  Positive  results are indicative of active infection with SARS-CoV-2.  Clinical  correlation with patient history and other diagnostic information is  necessary to determine patient infection status.  Positive results do  not rule out bacterial infection or co-infection with other viruses. If result is PRESUMPTIVE POSTIVE SARS-CoV-2 nucleic acids MAY BE PRESENT.   A presumptive positive result was obtained on the submitted specimen  and confirmed on repeat testing.  While 2019 novel coronavirus  (SARS-CoV-2) nucleic acids may be present in the submitted sample  additional confirmatory testing may be necessary for epidemiological  and / or clinical management purposes  to differentiate between  SARS-CoV-2 and other Sarbecovirus currently known to infect humans.  If clinically indicated additional testing with an alternate test  methodology [phone]) is advised. The SARS-CoV-2 RNA is generally  detectable in upper and lower respiratory sp ecimens during the acute  phase of infection. The expected result is Negative. Fact Sheet for Patients:   StrictlyIdeas.no Fact Sheet for Healthcare Providers: BankingDealers.co.za This test is not yet approved or cleared by the [place] FDA and has been authorized for detection and/or diagnosis of SARS-CoV-2 by FDA under an Emergency Use Authorization (EUA).  This EUA will remain in effect (meaning this test can be used) for the duration of the COVID-19 declaration under Section 564(b)(1) of the Act, 21 U.S.C. section 360bbb-3(b)(1), unless the authorization is terminated or revoked sooner. Performed at [facility], [place] [address], [place], [place] [address]   MRSA PCR Screening     Status: None   Collection Time: 02/14/19 10:41 PM   Specimen: Nasopharyngeal  Result Value Ref Range Status   MRSA by PCR NEGATIVE NEGATIVE Final    Comment:  The GeneXpert MRSA Assay (FDA approved for NASAL specimens only), is one component of a comprehensive MRSA colonization surveillance program. It is not intended to diagnose MRSA infection nor to guide or monitor treatment for MRSA infections. Performed at [facility], [place] [address], [place], [place] [address]      Labs: Basic Metabolic Panel: Recent Labs  Lab 02/14/19 1320 02/15/19 0049 02/16/19 0734 02/17/19 0312  NA 135 135 134* 133*  K 3.7 3.8 4.7 5.6*  CL 95* 97* 98 95*  CO2 25 23 17* 20*  GLUCOSE 86 101* 117* 181*  BUN 28* 39* 70* 94*  CREATININE 8.91* 11.33* 14.71* 17.03*  CALCIUM 10.6* 9.4 8.6* 7.9*  PHOS  --   --  4.9*  --    Liver Function Tests: Recent Labs  Lab 02/16/19 0734  ALBUMIN 3.9   No results for input(s): LIPASE, AMYLASE in the last 168 hours. No results for input(s): AMMONIA in the last 168 hours. CBC: Recent Labs  Lab 02/14/19 1320 02/15/19 0049 02/16/19 0734 02/17/19 0312  WBC 5.9 5.7 8.5 8.1  NEUTROABS 3.7  --   --   --   HGB 12.1* 11.6* 11.5* 11.4*  HCT 37.4* 35.8* 36.0* 35.1*  MCV 94.2 94.2 96.0 94.9  PLT 170 137* 128*  132*   Cardiac Enzymes: No results for input(s): CKTOTAL, CKMB, CKMBINDEX, TROPONINI in the last 168 hours. BNP: BNP (last 3 results) Recent Labs    10/02/18 0039 10/27/18 1511  BNP 3,561.2* 3,129.6*    ProBNP (last 3 results) No results for input(s): PROBNP in the last 8760 hours.  CBG: Recent Labs  Lab 02/16/19 0634 02/16/19 1748 02/16/19 1753 02/16/19 2122 02/17/19 0621  GLUCAP 124* 29* 106* 252* 92       Signed:  Domenic Polite MD.  [facility] 02/17/2019, 4:03 PM

## 2019-02-17 NOTE — Progress Notes (Signed)
Progress Note  Patient Name: Patrick [name] Date of Encounter: 02/17/2019  Primary Cardiologist: Dorris Carnes, MD   Subjective   Breathing is OK  No CP    Inpatient Medications    Scheduled Meds: . aspirin EC  81 mg Oral [name]  . calcium acetate  2,668 mg Oral Q supper  . calcium-vitamin D  2 tablet Oral Q breakfast  . Chlorhexidine Gluconate Cloth  6 each Topical Q0600  . Chlorhexidine Gluconate Cloth  6 each Topical Q0600  . clopidogrel  75 mg Oral [name]  . ezetimibe  10 mg Oral [name]  . insulin aspart  0-5 Units Subcutaneous QHS  . insulin aspart  0-9 Units Subcutaneous TID WC  . midodrine  10 mg Oral Q M,W,F  . multivitamin  1 tablet Oral [name]  . nitroGLYCERIN  0.5 inch Topical Q6H  . pantoprazole  40 mg Oral [name]  . rosuvastatin  10 mg Oral [name]  . sodium chloride flush  3 mL Intravenous Q12H  . sodium chloride flush  3 mL Intravenous Q12H  . sodium chloride flush  3 mL Intravenous Q12H   Continuous Infusions: . sodium chloride    . sodium chloride     PRN Meds: sodium chloride, sodium chloride, acetaminophen, albuterol, dextromethorphan, docusate sodium, morphine injection, oxyCODONE-acetaminophen, sodium chloride flush, sodium chloride flush   Vital Signs    Vitals:   02/17/19 0559 02/17/19 0645 02/17/19 0650 02/17/19 0700  BP: (!) 100/46 (!) 119/35 (!) 121/55 (!) 111/42  Pulse: 63 (!) 56 (!) 54 (!) 52  Resp: 18 17 13 14   Temp: (!) 97.3 F (36.3 C) (!) 97.4 F (36.3 C)    TempSrc: Oral Oral    SpO2: 100% 100%    Weight: 98.9 kg 98.9 kg    Height:        Intake/Output Summary (Last 24 hours) at 02/17/2019 0744 Last data filed at 02/17/2019 0500 Gross per 24 hour  Intake 39.39 ml  Output 0 ml  Net 39.39 ml   Last 3 Weights 02/17/2019 02/17/2019 02/16/2019  Weight (lbs) 218 lb 0.6 oz 218 lb 0.6 oz 223 lb 8 oz  Weight (kg) 98.9 kg 98.9 kg 101.379 kg      Telemetry    SR  - Personally Reviewed  ECG    None- Personally Reviewed  Physical  Exam   GEN: Obese 63 yo in no acute distress.  UNdergoing dialysis  Neck: Neck is full Cardiac: RRR, no murmurs, rubs, or gallops.  Respiratory: Clear to auscultation bilaterally. GI: Soft, nontender, non-distended  MS: No edema; No deformity. Neuro:  Nonfocal  Psych: Normal affect   Labs    High Sensitivity Troponin:   Recent Labs  Lab 02/14/19 1320 02/15/19 1248  TROPONINIHS 289* 175*      Cardiac EnzymesNo results for input(s): TROPONINI in the last 168 hours. No results for input(s): TROPIPOC in the last 168 hours.   Chemistry Recent Labs  Lab 02/15/19 0049 02/16/19 0734 02/17/19 0312  NA 135 134* 133*  K 3.8 4.7 5.6*  CL 97* 98 95*  CO2 23 17* 20*  GLUCOSE 101* 117* 181*  BUN 39* 70* 94*  CREATININE 11.33* 14.71* 17.03*  CALCIUM 9.4 8.6* 7.9*  ALBUMIN  --  3.9  --   GFRNONAA 4* 3* 3*  GFRAA 5* 4* 3*  ANIONGAP 15 19* 18*     Hematology Recent Labs  Lab 02/15/19 0049 02/16/19 0734 02/17/19 0312  WBC 5.7 8.5 8.1  RBC 3.80* 3.75* 3.70*  HGB 11.6* 11.5* 11.4*  HCT 35.8* 36.0* 35.1*  MCV 94.2 96.0 94.9  MCH 30.5 30.7 30.8  MCHC 32.4 31.9 32.5  RDW 17.8* 17.8* 17.9*  PLT 137* 128* 132*    BNPNo results for input(s): BNP, PROBNP in the last 168 hours.   DDimer No results for input(s): DDIMER in the last 168 hours.   Radiology    No results found.  Cardiac Studies   Echo 08/20/2018 Study Conclusions  - Left ventricle: The cavity size was normal. Wall thickness was increased in a pattern of mild LVH. Systolic function was normal. The estimated ejection fraction was in the range of 50% to 55%. Wall motion was normal; there were no regional wall motion abnormalities. Doppler parameters are consistent with abnormal left ventricular relaxation (grade 1 diastolic dysfunction). Doppler parameters are consistent with high ventricular filling pressure. - Aortic valve: There was mild stenosis. There was trivial regurgitation. -  Mitral valve: Severely calcified annulus.  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  Total occlusion of the proximal to mid LAD filling late by left to left and right to left collaterals.First diagonal contains 50% proximal narrowing.  Left main trunk is normal.  Circumflex gives origin to 3 obtuse marginal branches. The first obtuse marginal contains 50% proximal narrowing.  Right coronary artery is dominant giving PDA and left ventricular branch. Diffuse calcified 60% stenosis is noted throughout the mid vessel ending in the distal segment. No high-grade focal obstruction is seen.  Left ventriculogram and LV pressures were not recorded.  RECOMMENDATIONS:   Angina is related to total occlusion of the proximal to mid LAD. LIMA to the LAD is atretic. 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  Discussed with patient.   Atherectomy 02/16/19   Mid RCA to Dist RCA lesion is 65% stenosed.  A drug-eluting stent was successfully placed using a STENT SYNERGY DES [id number].  Prox RCA to Mid RCA lesion is 95% stenosed.  A drug-eluting stent was successfully placed using a STENT SYNERGY DES [id number].  Post intervention, there is a 0% residual stenosis.  Post intervention, there is a 0% residual stenosis.   1. Successful PCI of the mid to distal RCA using orbital atherectomy and stenting with overlapping DES x 2.   Plan: DAPT for at least one year. Anticipate DC tomorrow.     Patient Profile     63 y.o. 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 Regenia Skeeter  Assessment & Plan    1  CAD    Pt s/p atherectomy yesterday with DES x 2   Plan to continue DAPT    Pt OK from cardiac standpiint for d/c later today    2   HTN  BP is low at times  WIll need to be followed   3  HL   Crestor increased   WIll need to be followed as outpt  4  ESRD  On HD  WIll make sure that pt has appt in clinic       For questions or updates, please contact [place] Please consult [email or web address] for contact info under        Signed, Dorris Carnes, MD  02/17/2019, 7:44 AM

## 2019-02-17 NOTE — Consult Note (Signed)
   [facility] CM Inpatient Consult   02/17/2019  Patrick [name] March 26, 1956 [id number]    Patient was screened for [facility] Care Management services needs under his Medicare/ [employer] insurance plan, with 39% extreme high risk score for unplanned readmission and has 4 hospitalizations and 1 ED visit in the past 6 months.  Chart review reveals that patient is NOT currently a beneficiary of the attributed [place] in the [employer].   Medical record shows that patient has been following with Dr. Renato Shin with [facility] for DM and [facility].  Reason: His current primary care provider listed is Dr. Nolene Ebbs with [facility], not a [facility] provider and not affiliated with [facility].  This patient is currently Not eligible for [facility] Care Management Services. Membership roster used to verify non-eligible status.     For questions, please call:   Edwena Felty A. Yazen Rosko, BSN, RN-BC [facility] Liaison Cell: [phone]

## 2019-02-17 NOTE — Telephone Encounter (Signed)
**Note De-Identified Ovetta Bazzano Obfuscation** The pt is currently in the hospital. Will monitor his chart and call him once discharged.

## 2019-02-17 NOTE — Telephone Encounter (Signed)
**Note De-identified Joceline Hinchcliff Obfuscation** -----  **Note De-Identified Danamarie Minami Obfuscation** Message from Ledora Bottcher, [place] sent at 02/17/2019 10:51 AM EDT ----- Pt will be a TOC follow up.  Thanks Angie

## 2019-02-17 NOTE — Discharge Instructions (Signed)
Angina  Angina is very bad discomfort or pain in the chest, neck, arm, jaw, or back. The discomfort is caused by a lack of blood in the middle layer of the heart wall (myocardium). What are the causes? This condition is caused by a buildup of fat and cholesterol (plaque) in your arteries (atherosclerosis). This buildup narrows the arteries and makes it hard for blood to flow. What increases the risk? You are more likely to develop this condition if:  You have high levels of cholesterol in your blood.  You have high blood pressure (hypertension).  You have diabetes.  You have a family history of heart disease.  You are not active, or you do not exercise enough.  You feel sad (depressed).  You have been treated with high energy rays (radiation) on the left side of your chest. Other risk factors are:  Using tobacco.  Being very overweight (obese).  Eating a diet high in unhealthy fats (saturated fats).  Having stress, or being exposed to things that cause stress.  Using drugs, such as cocaine. Women have a greater risk for angina if:  They are older than 40.  They have stopped having their period (are in postmenopause). What are the signs or symptoms? Common symptoms of this condition in both men and women may include:  Chest pain, which may: ? Feel like a crushing or squeezing in the chest. ? Feel like a tightness, pressure, fullness, or heaviness in the chest. ? Last for more than a few minutes at a time. ? Stop and come back (recur) after a few minutes.  Pain in the neck, arm, jaw, or back.  Heartburn or upset stomach (indigestion) for no reason.  Being short of breath.  Feeling sick to your stomach (nauseous).  Sudden cold sweats. Women and people with diabetes may have other symptoms that are not usual, such as feeling:  Tired (fatigue).  Worried or nervous (anxious) for no reason.  Weak for no reason.  Dizzy or passing out (fainting). How is this  treated? This condition may be treated with:  Medicines. These are given to: ? Prevent blood clots. ? Prevent heart attack. ? Relax blood vessels and improve blood flow to the heart (nitrates). ? Reduce blood pressure. ? Improve the pumping action of the heart. ? Reduce fat and cholesterol in the blood.  A procedure to widen a narrowed or blocked artery in the heart (angioplasty).  Surgery to allow blood to go around a blocked artery (coronary artery bypass surgery). Follow these instructions at home: Medicines  Take over-the-counter and prescription medicines only as told by your doctor.  Do not take these medicines unless your doctor says that you can: ? NSAIDs. These include:  Ibuprofen.  Naproxen. ? Vitamin supplements that have vitamin A, vitamin E, or both. ? Hormone therapy that contains estrogen with or without progestin. Eating and drinking   Eat a heart-healthy diet that includes: ? Lots of fresh fruits and vegetables. ? Whole grains. ? Low-fat (lean) protein. ? Low-fat dairy products.  Follow instructions from your doctor about what you cannot eat or drink. Activity  Follow an exercise program that your doctor tells you.  Talk with your doctor about joining a program to help improve the health of your heart (cardiac rehab).  When you feel tired, take a break. Plan breaks if you know you are going to feel tired. Lifestyle   Do not use any products that contain nicotine or tobacco. This includes cigarettes, e-cigarettes, and  chewing tobacco. If you need help quitting, ask your doctor.  If your doctor says you can drink alcohol: ? Limit how much you use to:  0-1 drink a day for women who are not pregnant.  0-2 drinks a day for men. ? Be aware of how much alcohol is in your drink. In the [place] one drink equals:  One 12 oz bottle of beer (355 mL).  One 5 oz glass of wine (148 mL).  One 1 oz glass of hard liquor (44 mL). General instructions  Stay  at a healthy weight. If your doctor tells you to do so, work with him or her to lose weight.  Learn to deal with stress. If you need help, ask your doctor.  Keep your vaccines up to date. Get a flu shot every year.  Talk with your doctor if you feel sad. Take a screening test to see if you are at risk for depression.  Work with your doctor to manage any other health problems that you have. These may include diabetes or high blood pressure.  Keep all follow-up visits as told by your doctor. This is important. Get help right away if:  You have pain in your chest, neck, arm, jaw, or back, and the pain: ? Lasts more than a few minutes. ? Comes back. ? Does not get better after you take medicine under your tongue (sublingual nitroglycerin). ? Keeps getting worse. ? Comes more often.  You have any of these problems for no reason: ? Sweating a lot. ? Heartburn or upset stomach. ? Shortness of breath. ? Trouble breathing. ? Feeling sick to your stomach. ? Throwing up (vomiting). ? Feeling more tired than normal. ? Feeling nervous or worrying more than normal. ? Weakness.  You are suddenly dizzy or light-headed.  You pass out. These symptoms may be an emergency. Do not wait to see if the symptoms will go away. Get medical help right away. Call your local emergency services (911 in the [place] Do not drive yourself to the hospital. Summary  Angina is very bad discomfort or pain in the chest, neck, arm, neck, or back.  Symptoms include chest pain, heartburn or upset stomach for no reason, and shortness of breath.  Women or people with diabetes may have symptoms that are not usual, such as feeling nervous or worried for no reason, weak for no reason, or tired.  Take all medicines only as told by your doctor.  You should eat a heart-healthy diet and follow an exercise program. This information is not intended to replace advice given to you by your health care provider. Make sure you  discuss any questions you have with your health care provider. Document Released: 12/31/2007 Document Revised: 03/01/2018 Document Reviewed: 03/01/2018 Elsevier Patient Education  2020 [employer].

## 2019-02-17 NOTE — Progress Notes (Addendum)
[facility] Progress Note   Subjective:   Patient seen and examined at bedside in dialysis.  States he is feeling well.  No CP, SOB, n/v/d and edema.  Tolerating dialysis well.  Objective Vitals:   02/17/19 0800 02/17/19 0830 02/17/19 0900 02/17/19 1020  BP: 99/82 (!) 91/37 (!) 86/39   Pulse: (!) 54 (!) 48 (!) 59   Resp:      Temp:    97.6 F (36.4 C)  TempSrc:    Oral  SpO2:      Weight:      Height:       Physical Exam General:NAD, pleasant male, laying in bed Heart:RRR no mrg Lungs:CTAB Abdomen:soft, NTND, +large hernia Extremities:no LE edema Dialysis Access: LU AVG maturing, L IJ TDC accessed   Filed Weights   02/16/19 1840 02/17/19 0559 02/17/19 0645  Weight: 101.4 kg 98.9 kg 98.9 kg    Intake/Output Summary (Last 24 hours) at 02/17/2019 1240 Last data filed at 02/17/2019 1020 Gross per 24 hour  Intake -  Output 1600 ml  Net -1600 ml    Additional Objective Labs: Basic Metabolic Panel: Recent Labs  Lab 02/15/19 0049 02/16/19 0734 02/17/19 0312  NA 135 134* 133*  K 3.8 4.7 5.6*  CL 97* 98 95*  CO2 23 17* 20*  GLUCOSE 101* 117* 181*  BUN 39* 70* 94*  CREATININE 11.33* 14.71* 17.03*  CALCIUM 9.4 8.6* 7.9*  PHOS  --  4.9*  --    Liver Function Tests: Recent Labs  Lab 02/16/19 0734  ALBUMIN 3.9   CBC: Recent Labs  Lab 02/14/19 1320 02/15/19 0049 02/16/19 0734 02/17/19 0312  WBC 5.9 5.7 8.5 8.1  NEUTROABS 3.7  --   --   --   HGB 12.1* 11.6* 11.5* 11.4*  HCT 37.4* 35.8* 36.0* 35.1*  MCV 94.2 94.2 96.0 94.9  PLT 170 137* 128* 132*  CBG: Recent Labs  Lab 02/16/19 0634 02/16/19 1748 02/16/19 1753 02/16/19 2122 02/17/19 0621  GLUCAP 124* 29* 106* 252* 92    Medications: . sodium chloride    . sodium chloride     . aspirin EC  81 mg Oral Daily  . calcium acetate  2,668 mg Oral Q supper  . calcium-vitamin D  2 tablet Oral Q breakfast  . Chlorhexidine Gluconate Cloth  6 each Topical Q0600  . Chlorhexidine Gluconate  Cloth  6 each Topical Q0600  . clopidogrel  75 mg Oral Daily  . ezetimibe  10 mg Oral Daily  . insulin aspart  0-5 Units Subcutaneous QHS  . insulin aspart  0-9 Units Subcutaneous TID WC  . midodrine  10 mg Oral Q M,W,F  . multivitamin  1 tablet Oral Daily  . nitroGLYCERIN  0.5 inch Topical Q6H  . pantoprazole  40 mg Oral Daily  . rosuvastatin  10 mg Oral Daily  . sodium chloride flush  3 mL Intravenous Q12H  . sodium chloride flush  3 mL Intravenous Q12H  . sodium chloride flush  3 mL Intravenous Q12H    Dialysis Orders: [facility]  4h  400/800  96.5kg  2/3.5 bath  Hep 3000  TDC  - venofer 50 /wk  Assessment/Plan: 1. CAD s/p PCI to RCA yesterday. Feeling well today. 2. ESRD - on HD MWF. K 5.6, off schedule d/t PCI yesterday.  HD tomorrow per regular schedule. 3. Anemia of CKD- Hgb 11.4. No indication for ESA at this time.  4. Secondary hyperparathyroidism - Ca a little low, using  3.5Ca bath. 5. HTN/volume - BP soft, during dialysis, asymptomatic.  Appears euvolemic on exam 6. Nutrition - Renal diet w/fluid restriction  Jen Mow, PA-C [facility] Pager: [phone] 02/17/2019,12:40 PM  LOS: 3 days   Pt seen, examined and agree w A/P as above.  Kelly Splinter  MD 02/17/2019, 1:23 PM

## 2019-02-18 DIAGNOSIS — N186 End stage renal disease: Secondary | ICD-10-CM | POA: Diagnosis not present

## 2019-02-18 DIAGNOSIS — D509 Iron deficiency anemia, unspecified: Secondary | ICD-10-CM | POA: Diagnosis not present

## 2019-02-18 DIAGNOSIS — N2581 Secondary hyperparathyroidism of renal origin: Secondary | ICD-10-CM | POA: Diagnosis not present

## 2019-02-18 NOTE — Telephone Encounter (Signed)
**Note De-Identified Patrick [name] Obfuscation** TCM Call: 1st attempt No answer and no way to leave a message on home phone but I was able to leave a VM asking the pt to call me back on his cell phone.

## 2019-02-21 DIAGNOSIS — N186 End stage renal disease: Secondary | ICD-10-CM | POA: Diagnosis not present

## 2019-02-21 DIAGNOSIS — N2581 Secondary hyperparathyroidism of renal origin: Secondary | ICD-10-CM | POA: Diagnosis not present

## 2019-02-21 DIAGNOSIS — D509 Iron deficiency anemia, unspecified: Secondary | ICD-10-CM | POA: Diagnosis not present

## 2019-02-21 NOTE — Telephone Encounter (Signed)
Lm to call back ./cy 

## 2019-02-22 ENCOUNTER — Telehealth (HOSPITAL_COMMUNITY): Payer: Self-pay

## 2019-02-22 NOTE — Telephone Encounter (Signed)
Pt insurance is active and benefits verified through Medicare a/b Co-pay 0, DED $198/$198 met, out of pocket 0/0 met, co-insurance 20%. no pre-authorization required. Passport, 02/22/2019 @ 2:34pm, REF# [id number]  Will contact patient to see if he is interested in the Cardiac Rehab Program. If interested, patient will need to complete follow up appt. Once completed, patient will be contacted for scheduling upon review by the RN Navigator.  2ndary insurance is active and benefits verified through [place]. Co-pay 0, DED 0/0 met, out of pocket 0/0 met, co-insurance 0. No pre-authorization required. Faxed over ppw to Dr. [name] office.

## 2019-02-22 NOTE — Telephone Encounter (Signed)
Patient contacted regarding discharge from [facility] on 02/17/2019.  Patient understands to follow up with provider Daune Perch, NP on 02/24/2019 at 9:00 at [facility] in [place]. Patient understands discharge instructions? Yes Patient understands medications and regiment? Yes Patient understands to bring all medications to this visit? Yes

## 2019-02-22 NOTE — Progress Notes (Signed)
Cardiology Office Note:    Date:  02/24/2019   ID:  Patrick [name], DOB Mar 09, 1956, MRN [id number]  PCP:  Nolene Ebbs, MD  Cardiologist:  Dorris Carnes, MD  Referring MD: Nolene Ebbs, MD   Chief Complaint  Patient presents with  . Hospitalization Follow-up    S/P PCI    History of Present Illness:    Patrick [name] is a 63 y.o. 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   Patient had cardiac catheterization in May/2018 for angina with finding of atretic LIMA-LAD and occluded LAD with left to left and left to right collaterals.  He had moderate nonobstructive disease elsewhere.  It was felt that his angina was related to his occluded LAD.  Discussion of CTO was undertaken but were be very difficult so medical therapy was elected.  Patient was admitted in 10/2018 with weakness and shortness of breath, found to be hypokalemic with potassium of 2.8.  Troponin was slightly elevated at 1.13, felt to be in the setting of renal insufficiency.  He was recommended for cardiac catheterization but declined at the time.  He continued on aspirin, Plavix, beta-blocker, statin and amlodipine.  Imdur 30 mg was added.  The patient presented to the ED on 02/14/2019 with recurrent angina, progressively worsening over 4 weeks until occurring at rest.  High-sensitivity troponin was 289, follow-up 175.  No ischemic changes on EKG. He was taken for left heart catheterization on 02/15/2019 which noted chronic total occlusion of the mid LAD and high-grade obstructive disease in the mid RCA.  He was brought back to the Cath Lab on 02/16/2023 successful PCI to the mid to distal RCA using orbital atherectomy and stenting with overlapping DES x2.  Recommendation is for dual antiplatelet therapy for at least 1  year. Blood pressure was low at times and noted will need to be monitored.  Today he is here for hosptial follow and reports doing much better, no further discomfort in the chest and arm. Breathing is good. He is not doing much activity. He says he cannot walk much as his vessels in his calves are calcified. Hurts with walking, resolves with rest. No longer having any PND. No edema.   BP runs low and drops with dialysis. Takes midodrine on dialysis days, 1/2 hour before treatment and 2 hrs into treatment. He does not feel bad with it. He does not take amlodipine on dialysis days.   Past Medical History:  Diagnosis Date  . Arthritis   . Atrial flutter ([place])    Ablation 2018  . 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 Erectile dysfunction   . ESRD (end stage renal disease) on dialysis ([place])    Pt on HD 2003 >> transplanted in 2009, back on HD in 2016. [name]TS.   . Essential hypertension   . Gout   . Hernia of abdominal wall   . History of blood transfusion   . History of cardiomyopathy   . History of kidney stones   . History of pneumonia   . Migraine   . Myocardial infarction ([place])    1996  . Pneumonia   . Secondary  hyperparathyroidism ([place])   . Type 2 diabetes mellitus ([place])   . Wears glasses     Past Surgical History:  Procedure Laterality Date  . A-FLUTTER ABLATION N/A 09/24/2016   Procedure: A-Flutter Ablation;  Surgeon: Will Meredith Leeds, MD;  Location: [facility] [facility];  Service: Cardiovascular;  Laterality: N/A;  . ABDOMINAL AORTOGRAM W/LOWER EXTREMITY N/A 04/13/2017   Procedure: ABDOMINAL AORTOGRAM W/LOWER EXTREMITY;  Surgeon: Conrad Waxahachie, MD;  Location: [place] [place] [facility];  Service: Cardiovascular;  Laterality: N/A;  Bilater lower extermity  . APPENDECTOMY    . AV FISTULA PLACEMENT Right  09/18/2014   Procedure: INSERTION OF ARTERIOVENOUS (AV) GORE-TEX GRAFT ARM USING 4-7MM  X 45CM STRETCH GORE-TEX VASCULAR GRAFT;  Surgeon: Rosetta Posner, MD;  Location: [facility];  Service: Vascular;  Laterality: Right;  . AV FISTULA PLACEMENT Left 07/07/2016   Procedure: INSERTION OF LEFT BRACHIAL TO AXILLARY ARTERIOVENOUS (AV) GORE-TEX ARM GRAFT;  Surgeon: Conrad [place] Center, MD;  Location: [employer] [facility];  Service: Vascular;  Laterality: Left;  . AV FISTULA PLACEMENT Left 12/09/2018   Procedure: Insertion Of Arteriovenous (Av) Gore-Tex Graft Arm, left arm;  Surgeon: Marty Heck, MD;  Location: [facility];  Service: Vascular;  Laterality: Left;  . CARDIAC CATHETERIZATION  ~ 2016  . COLONOSCOPY    . 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 CORONARY ATHERECTOMY N/A 02/16/2019   Procedure: CORONARY ATHERECTOMY;  Surgeon: Martinique, Peter M, MD;  Location: [facility];  Service: Cardiovascular;  Laterality: N/A;  . EXCHANGE OF A DIALYSIS CATHETER Left 01/11/2018   Procedure: EXCHANGE OF TUNNELED DIALYSIS CATHETER;  Surgeon: Rosetta Posner, MD;  Location: [place];  Service: Vascular;  Laterality: Left;  . HERNIA REPAIR  2017   with nephrectomy  . INSERTION OF DIALYSIS CATHETER N/A 10/08/2017   Procedure: INSERTION OF TUNNELED DIALYSIS CATHETER;  Surgeon: Conrad Bardonia, MD;  Location: [facility];  Service: Vascular;  Laterality: N/A;  . IR FLUORO GUIDE CV LINE LEFT  03/12/2018  . IR GENERIC HISTORICAL  05/11/2016   IR FLUORO GUIDE CV LINE LEFT 05/11/2016 Corrie Mckusick, DO [facility]  . IR GENERIC HISTORICAL  05/11/2016   IR US GUIDE VASC ACCESS LEFT 05/11/2016 Corrie Mckusick, DO [facility]  . IR GENERIC HISTORICAL  05/11/2016   IR US GUIDE VASC ACCESS RIGHT 05/11/2016 Corrie Mckusick, DO [facility]  . IR GENERIC HISTORICAL  05/11/2016   IR RADIOLOGY PERIPHERAL GUIDED IV START 05/11/2016 Corrie Mckusick, DO [facility]  . KIDNEY TRANSPLANT   2009  . LEFT HEART CATH AND CORONARY ANGIOGRAPHY N/A 02/15/2019   Procedure: LEFT HEART CATH AND CORONARY ANGIOGRAPHY;  Surgeon: Martinique, Peter M, MD;  Location: [facility];  Service: Cardiovascular;  Laterality: N/A;  . LEFT HEART CATH AND CORS/GRAFTS ANGIOGRAPHY N/A 12/04/2017   Procedure: LEFT HEART CATH AND CORS/GRAFTS ANGIOGRAPHY;  Surgeon: Belva Crome, MD;  Location: [facility];  Service: Cardiovascular;  Laterality: N/A;  . NEPHRECTOMY  2017   transplant rejected   . PERIPHERAL VASCULAR CATHETERIZATION N/A 06/04/2016   Procedure: Upper Extremity Venography;  Surgeon: Waynetta Sandy, MD;  Location: [facility];  Service: Cardiovascular;  Laterality: N/A;  . PERIPHERAL VASCULAR INTERVENTION  04/13/2017   Procedure: PERIPHERAL VASCULAR INTERVENTION;  Surgeon: Conrad Pope, MD;  Location: [place];  Service: Cardiovascular;;  Lt. Common/Exernal  Iliac  . THROMBECTOMY  AND REVISION OF ARTERIOVENTOUS (AV) GORETEX  GRAFT Left 10/08/2017   Procedure: THROMBECTOMY of ARTERIOVENTOUS (AV) GORETEX  GRAFT LEFT UPPER ARM;  Surgeon: Conrad Merrill, MD;  Location: [facility];  Service: Vascular;  Laterality: Left;  . THROMBECTOMY W/ EMBOLECTOMY Left 09/14/2017   Procedure: THROMBECTOMY ARTERIOVENOUS GORE-TEX GRAFT LEFT UPPER ARM;  Surgeon: Rosetta Posner, MD;  Location: [facility];  Service: Vascular;  Laterality: Left;  . UPPER EXTREMITY ANGIOGRAPHY Bilateral 10/29/2018   Procedure: UPPER EXTREMITY ANGIOGRAPHY;  Surgeon: Marty Heck, MD;  Location: [facility];  Service: Cardiovascular;  Laterality: Bilateral;  . UPPER EXTREMITY VENOGRAPHY N/A 11/16/2017   Procedure: UPPER EXTREMITY VENOGRAPHY - Right Arm;  Surgeon: Conrad Inwood, MD;  Location: [facility];  Service: Cardiovascular;  Laterality: N/A;  . UPPER EXTREMITY VENOGRAPHY Bilateral 10/29/2018   Procedure: UPPER EXTREMITY VENOGRAPHY;  Surgeon: Marty Heck, MD;  Location: [facility];   Service: Cardiovascular;  Laterality: Bilateral;    Current Medications: Current Meds  Medication Sig  . acetaminophen (TYLENOL) 500 MG tablet Take 1,000 mg by mouth every 6 (six) hours as needed for moderate pain or headache.  . albuterol (PROVENTIL HFA;VENTOLIN HFA) 108 (90 Base) MCG/ACT inhaler Inhale 1-2 puffs into the lungs every 6 (six) hours as needed for wheezing or shortness of breath. (Patient taking differently: Inhale 2 puffs into the lungs every 6 (six) hours as needed for wheezing or shortness of breath. )  . aspirin EC 81 MG tablet Take 81 mg by mouth daily.   . calcium acetate (PHOSLO) 667 MG capsule Take 2,668 mg by mouth daily with supper.   . Calcium Carbonate-Vitamin D (CALCIUM 500/D PO) Take 3 tablets by mouth daily.   . clopidogrel (PLAVIX) 75 MG tablet Take 1 tablet (75 mg total) by mouth daily.  . Colchicine 0.6 MG CAPS Take 0.6 mg by mouth See admin instructions. Take 0.6 mg when first symptoms of gout flare occur, then take another 0.6 mg 14 days later as needed for gout  . dextromethorphan (DELSYM) 30 MG/5ML liquid Take 30 mg by mouth as needed for cough.  . docusate sodium (COLACE) 100 MG capsule Take 100 mg by mouth daily as needed for mild constipation.  Marland Kitchen ezetimibe (ZETIA) 10 MG tablet Take 10 mg by mouth daily.   Marland Kitchen guaiFENesin (MUCINEX) 600 MG 12 hr tablet Take 600 mg by mouth 2 (two) times daily as needed for cough.  . isosorbide mononitrate (IMDUR) 30 MG 24 hr tablet Take 1 tablet (30 mg total) by mouth daily.  . midodrine (PROAMATINE) 10 MG tablet Take 10 mg by mouth every Monday, Wednesday, and Friday. Dialysis days only  . multivitamin (RENA-VIT) TABS tablet Take 1 tablet by mouth daily.   Marland Kitchen oxyCODONE-acetaminophen (PERCOCET/ROXICET) 5-325 MG tablet Take 1 tablet by mouth every 6 (six) hours as needed.  . pantoprazole (PROTONIX) 40 MG tablet Take 1 tablet (40 mg total) by mouth daily.  . repaglinide (PRANDIN) 0.5 MG tablet Take 1 tablet (0.5 mg total) by  mouth 2 (two) times daily before a meal.  . rosuvastatin (CRESTOR) 10 MG tablet Take 1 tablet (10 mg total) by mouth daily.  . [DISCONTINUED] amLODipine (NORVASC) 5 MG tablet TAKE 1 TABLET BY MOUTH DAILY (Patient taking differently: Take 5 mg by mouth daily. )  . [DISCONTINUED] isosorbide mononitrate (IMDUR) 60 MG 24 hr tablet Take 1 tablet (60 mg total) by mouth daily. (Patient taking differently: Take 90 mg by mouth daily.  Take one and one half tablet by mouth daily. 90 mg total)  . [DISCONTINUED] rosuvastatin (CRESTOR) 10 MG tablet Take 10 mg by mouth daily.      Allergies:   Baclofen, Iodinated diagnostic agents, Lipitor [atorvastatin], and Metoprolol   Social History   Socioeconomic History  . Marital status: Married    Spouse name: SHEILA  . Number of children: 5  . Years of education: 26  . Highest education level: 12th grade  Occupational History  . Occupation: DISABLED  Social Needs  . Financial resource strain: Not on file  . Food insecurity    Worry: Never true    Inability: Never true  . Transportation needs    Medical: No    Non-medical: No  Tobacco Use  . Smoking status: Former Smoker    Types: Cigarettes    Quit date: 07/04/2014    Years since quitting: 4.6  . Smokeless tobacco: Never Used  . Tobacco comment: "smoked ~ 1 pack/month when I did smoke; never a steady smoker"  Substance and Sexual Activity  . Alcohol use: Yes    Alcohol/week: 0.0 standard drinks    Comment: rare  "2 drinks, 1-2 times/year"  . Drug use: No  . Sexual activity: Not Currently    Birth control/protection: None  Lifestyle  . Physical activity    Days per week: 0 days    Minutes per session: 0 min  . Stress: Not at all  Relationships  . Social connections    Talks on phone: More than three times a week    Gets together: More than three times a week    Attends religious service: Never    Active member of club or organization: No    Attends meetings of clubs or organizations:  Never    Relationship status: Married  Other Topics Concern  . Not on file  Social History Narrative  . Not on file     Family History: The patient's family history includes HIV in his sister; Hyperlipidemia in his mother; Hypertension in his mother. ROS:   Please see the history of present illness.     All other systems reviewed and are negative.  EKGs/Labs/Other Studies Reviewed:    The following studies were reviewed today:  CORONARY ATHERECTOMY 02/16/2019  Conclusion   Mid RCA to Dist RCA lesion is 65% stenosed.  A drug-eluting stent was successfully placed using a STENT SYNERGY DES [id number].  Prox RCA to Mid RCA lesion is 95% stenosed.  A drug-eluting stent was successfully placed using a STENT SYNERGY DES [id number].  Post intervention, there is a 0% residual stenosis.  Post intervention, there is a 0% residual stenosis.   1. Successful PCI of the mid to distal RCA using orbital atherectomy and stenting with overlapping DES x 2.   Plan: DAPT for at least one year. Anticipate DC tomorrow.    Echo 08/20/2018 Study Conclusions  - Left ventricle: The cavity size was normal. Wall thickness was increased in a pattern of mild LVH. Systolic function was normal. The estimated ejection fraction was in the range of 50% to 55%. Wall motion was normal; there were no regional wall motion abnormalities. Doppler parameters are consistent with abnormal left ventricular relaxation (grade 1 diastolic dysfunction). Doppler parameters are consistent with high ventricular filling pressure. - Aortic valve: There was mild stenosis. There was trivial regurgitation. - Mitral valve: Severely calcified annulus.  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  Total occlusion of the proximal to mid LAD filling late by left to left and  right to left collaterals.First diagonal contains 50% proximal narrowing.  Left main trunk is normal.  Circumflex gives origin to 3 obtuse marginal branches. The first obtuse marginal contains 50% proximal narrowing.  Right coronary artery is dominant giving PDA and left ventricular branch. Diffuse calcified 60% stenosis is noted throughout the mid vessel ending in the distal segment. No high-grade focal obstruction is seen.  Left ventriculogram and LV pressures were not recorded.  RECOMMENDATIONS:   Angina is related to total occlusion of the proximal to mid LAD. LIMA to the LAD is atretic. 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  Discussed with patient.     EKG:  EKG is not ordered today.    Recent Labs: 05/12/2018: Magnesium 1.9 10/27/2018: ALT 34; B Natriuretic Peptide 3,129.6 02/17/2019: BUN 94; Creatinine, Ser 17.03; Hemoglobin 11.4; Platelets 132; Potassium 5.6; Sodium 133   Recent Lipid Panel    Component Value Date/Time   CHOL 122 02/15/2019 0049   CHOL 176 07/02/2018 0735   TRIG 85 02/15/2019 0049   HDL 46 02/15/2019 0049   HDL 39 (L) 07/02/2018 0735   CHOLHDL 2.7 02/15/2019 0049   VLDL 17 02/15/2019 0049   LDLCALC 59 02/15/2019 0049   LDLCALC 89 07/02/2018 0735    Physical Exam:    VS:  BP (!) 90/52   Pulse 81   Ht 5\' 6"  (8.786 m)   Wt 216 lb 12.8 oz (98.3 kg)   SpO2 99%   BMI 34.99 kg/m     Wt Readings from Last 3 Encounters:  02/24/19 216 lb 12.8 oz (98.3 kg)  02/17/19 212 lb 4.9 oz (96.3 kg)  12/09/18 208 lb (94.3 kg)     Physical Exam  Constitutional: He is oriented to person, place, and time. He appears well-developed and well-nourished. No distress.  HENT:  Head: Normocephalic and atraumatic.  Neck: Normal range of motion. Neck supple. No JVD present.   Cardiovascular: Normal rate, regular rhythm, normal heart sounds and intact distal pulses.  Can hear sounds from AV fistula in chest, unable to discern presence of any murmurs.   Pulmonary/Chest: Effort normal and breath sounds normal.  Abdominal: Soft. Bowel sounds are normal.  Musculoskeletal: Normal range of motion.        General: No edema.     Comments: Left upper arm AV graft, +bruit/thrill. Left upper chest permcath.   Neurological: He is alert and oriented to person, place, and time.  Skin: Skin is warm and dry.  Psychiatric: He has a normal mood and affect. His behavior is normal. Judgment and thought content normal.  Vitals reviewed.    ASSESSMENT:    1. Coronary artery disease of native artery of native heart with stable angina pectoris ([place])   2. Essential (primary) hypertension   3. Hyperlipidemia, unspecified hyperlipidemia type   4. ESRD on dialysis ([place])   5. PAD (peripheral artery disease) ([facility])    PLAN:    In order of problems listed above:  CAD (anomalous LM off RCA) CABG x 1 1997 -Recent cath for UAP with elevated troponin. 02/16/2019 underwent Orbital Atherectomy and overlapping DES X 2 to mid-distal RCA.  -DAPT for at least  1 year.  -Medical therapy with aspirin 81 mg, Plavix 75 mg, beta-blocker, statin, Imdur 60 mg -Symptoms resolved since cath. Feels good now. -With low BP, will try to reduce Imdur to 30 mg. He will try and can increase back to 60 mg if angina returns. He understands.   Essential hypertension -On amlodipine 5 mg daily and midodrine on dialysis days. Bisoprolol has been stopped.  -Blood pressure was soft in the hospital and amlodipine was held but he was discharged on it. -Today BP is low and is very low at dialysis. Will stop amlodipine. Will decrease Imdur to 30 mg since stent.  Hyperlipidemia -On Crestor 10 mg daily and Zetia 10 mg daily -Lipid panel on 02/15/2019 shows LDL of 59 which is at goal of less than 70. -Continue current  therapy.  ESRD -On hemodialysis, per nephrology  PAD -Has noncompressible lower extremity arteries per testing in 2019.  -Exertional leg claudication Has improved some with trying to increase walking, although still very limited.  -Will refer to Dr. [name] for further evaluation.  -Quit smoking about 6 ears ago. He smoked only a few cigarettes per day.    Medication Adjustments/Labs and Tests Ordered: Current medicines are reviewed at length with the patient today.  Concerns regarding medicines are outlined above. Labs and tests ordered and medication changes are outlined in the patient instructions below:  Patient Instructions  Medication Instructions:  DECREASE: Isosorbide (Imdur) to 30 mg once a day   STOP: Amlodipine   If you need a refill on your cardiac medications before your next appointment, please call your pharmacy.   Lab work: None   If you have labs (blood work) drawn today and your tests are completely normal, you will receive your results only by: Marland Kitchen MyChart Message (if you have MyChart) OR . A paper copy in the mail If you have any lab test that is abnormal or we need to change your treatment, we will call you to review the results.  Testing/Procedures: None   Follow-Up: At [facility], you and your health needs are our priority.  As part of our continuing mission to provide you with exceptional heart care, we have created designated Provider Care Teams.  These Care Teams include your primary Cardiologist (physician) and Advanced Practice Providers (APPs -  Physician Assistants and Nurse Practitioners) who all work together to provide you with the care you need, when you need it. You will need a follow up appointment in:  4 months.  Please call our office 2 months in advance to schedule this appointment.  You may see Dorris Carnes, MD or one of the following Advanced Practice Providers on your designated Care Team: Richardson Dopp, PA-C [place], [place] . Daune Perch, NP  You physician would like you to make an appointment with Dr. [name] for PAD  Any Other Special Instructions Will Be Listed Below (If Applicable).   Lifestyle Modifications to Prevent and Treat Heart Disease -Recommend heart healthy/Mediterranean diet, with whole grains, fruits, vegetable, fish, lean meats, nuts, and olive oil.  -Limit salt. -Recommend moderate walking, 3-5 times/week for 30-50 minutes each session. Aim for at least 150 minutes.week. Goal should be pace of 3 miles/hours, or walking 1.5 miles in 30 minutes -Recommend avoidance of tobacco products. Avoid excess alcohol. -Keep blood pressure well controlled, ideally less than 130/80.      Signed, Daune Perch, NP  02/24/2019 6:08 PM    [facility]

## 2019-02-22 NOTE — Patient Instructions (Addendum)
Medication Instructions:  DECREASE: Isosorbide (Imdur) to 30 mg once a day   STOP: Amlodipine   If you need a refill on your cardiac medications before your next appointment, please call your pharmacy.   Lab work: None   If you have labs (blood work) drawn today and your tests are completely normal, you will receive your results only by: Marland Kitchen MyChart Message (if you have MyChart) OR . A paper copy in the mail If you have any lab test that is abnormal or we need to change your treatment, we will call you to review the results.  Testing/Procedures: None   Follow-Up: At [facility], you and your health needs are our priority.  As part of our continuing mission to provide you with exceptional heart care, we have created designated Provider Care Teams.  These Care Teams include your primary Cardiologist (physician) and Advanced Practice Providers (APPs -  Physician Assistants and Nurse Practitioners) who all work together to provide you with the care you need, when you need it. You will need a follow up appointment in:  4 months.  Please call our office 2 months in advance to schedule this appointment.  You may see Dorris Carnes, MD or one of the following Advanced Practice Providers on your designated Care Team: Richardson Dopp, PA-C [place], [place] . Daune Perch, NP  You physician would like you to make an appointment with Dr. [name] for PAD  Any Other Special Instructions Will Be Listed Below (If Applicable).   Lifestyle Modifications to Prevent and Treat Heart Disease -Recommend heart healthy/Mediterranean diet, with whole grains, fruits, vegetable, fish, lean meats, nuts, and olive oil.  -Limit salt. -Recommend moderate walking, 3-5 times/week for 30-50 minutes each session. Aim for at least 150 minutes.week. Goal should be pace of 3 miles/hours, or walking 1.5 miles in 30 minutes -Recommend avoidance of tobacco products. Avoid excess alcohol. -Keep blood pressure well controlled,  ideally less than 130/80.

## 2019-02-22 NOTE — Telephone Encounter (Signed)
Follow up ° ° °Patient is returning call. Please call. °

## 2019-02-23 ENCOUNTER — Telehealth: Payer: Self-pay

## 2019-02-23 DIAGNOSIS — N186 End stage renal disease: Secondary | ICD-10-CM | POA: Diagnosis not present

## 2019-02-23 DIAGNOSIS — D509 Iron deficiency anemia, unspecified: Secondary | ICD-10-CM | POA: Diagnosis not present

## 2019-02-23 DIAGNOSIS — N2581 Secondary hyperparathyroidism of renal origin: Secondary | ICD-10-CM | POA: Diagnosis not present

## 2019-02-23 NOTE — Telephone Encounter (Signed)
   Spoke with pt who states that he is awaiting covid-19 test results from after being in the hospital. Pt states that his first test was negative and they re-tested him before he was discharged. Per pt's chart doesn't show where there is a pending covid-19 test. Pt also states that he has no symptoms of covid-19. I informed pt that when he comes in for his appointment with Pec[facility] Ades on 7/30 at 9 am to make sure to wear a mask and is not allowed any visitors with him. I also informed pt that he will need to arrive 15 minutes earlier due to getting his temperature taken and being asked the same screening questions. Pt verbalized understanding and thank me for the call.   COVID-19 Pre-Screening Questions:  . In the past 7 to 10 days have you had a cough,  shortness of breath, headache, congestion, fever (100 or greater) body aches, chills, sore throat, or sudden loss of taste or sense of smell? No . Have you been around anyone with known Covid 19. NO . Have you been around anyone who is awaiting Covid 19 test results in the past 7 to 10 days? Yes, pt states he is awaiting test results from hospital. . Have you been around anyone who has been exposed to Covid 19, or has mentioned symptoms of Covid 19 within the past 7 to 10 days? NO  If you have any concerns/questions about symptoms patients report during screening (either on the phone or at threshold). Contact the provider seeing the patient or DOD for further guidance.  If neither are available contact a member of the leadership team.

## 2019-02-24 ENCOUNTER — Encounter: Payer: Self-pay | Admitting: Cardiology

## 2019-02-24 ENCOUNTER — Other Ambulatory Visit: Payer: Self-pay

## 2019-02-24 ENCOUNTER — Ambulatory Visit (INDEPENDENT_AMBULATORY_CARE_PROVIDER_SITE_OTHER): Payer: Medicare Other | Admitting: Cardiology

## 2019-02-24 VITALS — BP 90/52 | HR 81 | Ht 66.0 in | Wt 216.8 lb

## 2019-02-24 DIAGNOSIS — E785 Hyperlipidemia, unspecified: Secondary | ICD-10-CM

## 2019-02-24 DIAGNOSIS — N186 End stage renal disease: Secondary | ICD-10-CM | POA: Diagnosis not present

## 2019-02-24 DIAGNOSIS — I739 Peripheral vascular disease, unspecified: Secondary | ICD-10-CM

## 2019-02-24 DIAGNOSIS — I25118 Atherosclerotic heart disease of native coronary artery with other forms of angina pectoris: Secondary | ICD-10-CM | POA: Diagnosis not present

## 2019-02-24 DIAGNOSIS — I10 Essential (primary) hypertension: Secondary | ICD-10-CM | POA: Diagnosis not present

## 2019-02-24 DIAGNOSIS — Z992 Dependence on renal dialysis: Secondary | ICD-10-CM | POA: Diagnosis not present

## 2019-02-24 DIAGNOSIS — I200 Unstable angina: Secondary | ICD-10-CM | POA: Diagnosis not present

## 2019-02-24 MED ORDER — ISOSORBIDE MONONITRATE ER 30 MG PO TB24: 30.0000 mg | ORAL_TABLET | Freq: Every day | ORAL | 3 refills | Status: DC

## 2019-02-24 MED ORDER — NITROGLYCERIN 0.4 MG SL SUBL: 0.4000 mg | SUBLINGUAL_TABLET | SUBLINGUAL | 5 refills | Status: DC | PRN

## 2019-02-24 MED ORDER — ROSUVASTATIN CALCIUM 10 MG PO TABS: 10.0000 mg | ORAL_TABLET | Freq: Every day | ORAL | 3 refills | Status: DC

## 2019-02-25 ENCOUNTER — Encounter: Payer: Self-pay | Admitting: Family

## 2019-02-25 ENCOUNTER — Ambulatory Visit (INDEPENDENT_AMBULATORY_CARE_PROVIDER_SITE_OTHER): Payer: Self-pay | Admitting: Family

## 2019-02-25 ENCOUNTER — Other Ambulatory Visit: Payer: Self-pay

## 2019-02-25 VITALS — BP 86/56 | HR 90 | Temp 97.1°F | Resp 16 | Ht 66.5 in | Wt 218.0 lb

## 2019-02-25 DIAGNOSIS — N186 End stage renal disease: Secondary | ICD-10-CM | POA: Diagnosis not present

## 2019-02-25 DIAGNOSIS — Z992 Dependence on renal dialysis: Secondary | ICD-10-CM

## 2019-02-25 DIAGNOSIS — D509 Iron deficiency anemia, unspecified: Secondary | ICD-10-CM | POA: Diagnosis not present

## 2019-02-25 DIAGNOSIS — N2581 Secondary hyperparathyroidism of renal origin: Secondary | ICD-10-CM | POA: Diagnosis not present

## 2019-02-25 DIAGNOSIS — I200 Unstable angina: Secondary | ICD-10-CM

## 2019-02-25 DIAGNOSIS — T82590A Other mechanical complication of surgically created arteriovenous fistula, initial encounter: Secondary | ICD-10-CM

## 2019-02-25 NOTE — Progress Notes (Signed)
CC: evaluation of malfunctioning left upper extremity AV graft  History of Present Illness  Patrick [name] is a 63 y.o. ([birth year]) male who is s/p placement of left upper extremity AV graft (4 mm x 7 mm tapered Gore-Tex graft) on 12-09-18 by Dr. Carlis Abbott. Pt was previously seen in consultation by Dr. Trula Slade for possible femoral AV graft placement.  Patient has failed bilateral upper extremity graft placements in the past.  Ultimately patient refused femoral graft placement and underwent bilateral upper extremity venogram to evaluate further options.  Right upper extremity venogram showed central venous occlusion.  On the left he did not have a central venous occlusion but did have a previous failed forearm loop graft and upper arm graft.  There was one remaining patent brachial vein and a patent axillary vein so we recommended an additional attempt at left upper extremity AV graft placement.    Pt returns today at the request of Dr. Justin Mend, reportedly unable to use AVG.   Pt states the AVG was accessed once, and only one needle was able to be accessed.   He dialyzes MWF via left upper chest TDC at [facility] on [address].  He is left hand dominant.   He had 2 cardiac stents placed 2 weeks ago, states he feels much better since then, no more chest pain or dyspnea.  He takes a daily ASA, Plavix, and Crestor  He quit tobacco use in 2015   Past Medical History:  Diagnosis Date  . Arthritis   . Atrial flutter ([place])    Ablation 2018  . 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 Erectile dysfunction   . ESRD (end stage renal disease) on dialysis ([place])    Pt on HD 2003 >> transplanted in 2009, back on HD in 2016. [name]TS.   . Essential hypertension   . Gout   . Hernia of abdominal wall   . History of blood  transfusion   . History of cardiomyopathy   . History of kidney stones   . History of pneumonia   . Migraine   . Myocardial infarction ([place])    1996  . Pneumonia   . Secondary hyperparathyroidism ([place])   . Type 2 diabetes mellitus ([place])   . Wears glasses     Social History Social History   Tobacco Use  . Smoking status: Former Smoker    Types: Cigarettes    Quit date: 07/04/2014    Years since quitting: 4.6  . Smokeless tobacco: Never Used  . Tobacco comment: "smoked ~ 1 pack/month when I did smoke; never a steady smoker"  Substance Use Topics  . Alcohol use: Yes    Alcohol/week: 0.0 standard drinks    Comment: rare  "2 drinks, 1-2 times/year"  . Drug use: No    Family History Family History  Problem Relation Age of Onset  . Hyperlipidemia Mother   . Hypertension Mother   . HIV Sister     Surgical History Past Surgical History:  Procedure Laterality Date  . A-FLUTTER ABLATION N/A 09/24/2016   Procedure: A-Flutter Ablation;  Surgeon: Will Meredith Leeds, MD;  Location: [place] [facility];  Service: Cardiovascular;  Laterality: N/A;  . ABDOMINAL AORTOGRAM W/LOWER EXTREMITY N/A 04/13/2017   Procedure: ABDOMINAL AORTOGRAM W/LOWER EXTREMITY;  Surgeon: Conrad Thiells, MD;  Location: [place] [facility];  Service: Cardiovascular;  Laterality: N/A;  Bilater lower extermity  . APPENDECTOMY    . AV FISTULA PLACEMENT Right 09/18/2014   Procedure: INSERTION OF ARTERIOVENOUS (AV) GORE-TEX GRAFT ARM USING 4-7MM  X 45CM STRETCH GORE-TEX VASCULAR GRAFT;  Surgeon: Rosetta Posner, MD;  Location: [place];  Service: Vascular;  Laterality: Right;  . AV FISTULA PLACEMENT Left 07/07/2016   Procedure: INSERTION OF LEFT BRACHIAL TO AXILLARY ARTERIOVENOUS (AV) GORE-TEX ARM GRAFT;  Surgeon: Conrad Pukwana, MD;  Location: [place];  Service: Vascular;  Laterality: Left;  . AV FISTULA PLACEMENT Left 12/09/2018   Procedure: Insertion Of Arteriovenous (Av) Gore-Tex Graft Arm, left arm;  Surgeon: Marty Heck, MD;  Location: [place];  Service: Vascular;  Laterality: Left;  . CARDIAC CATHETERIZATION  ~ 2016  . COLONOSCOPY    . 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 CORONARY ATHERECTOMY N/A 02/16/2019   Procedure: CORONARY ATHERECTOMY;  Surgeon: Martinique, Peter M, MD;  Location: [place] [facility];  Service: Cardiovascular;  Laterality: N/A;  . EXCHANGE OF A DIALYSIS CATHETER Left 01/11/2018   Procedure: EXCHANGE OF TUNNELED DIALYSIS CATHETER;  Surgeon: Rosetta Posner, MD;  Location: [place];  Service: Vascular;  Laterality: Left;  . HERNIA REPAIR  2017   with nephrectomy  . INSERTION OF DIALYSIS CATHETER N/A 10/08/2017   Procedure: INSERTION OF TUNNELED DIALYSIS CATHETER;  Surgeon: Conrad Kilgore, MD;  Location: [place];  Service: Vascular;  Laterality: N/A;  . IR FLUORO GUIDE CV LINE LEFT  03/12/2018  . IR GENERIC HISTORICAL  05/11/2016   IR FLUORO GUIDE CV LINE LEFT 05/11/2016 Corrie Mckusick, DO [facility]-INTER[facility]  . IR GENERIC HISTORICAL  05/11/2016   IR US GUIDE VASC ACCESS LEFT 05/11/2016 Corrie Mckusick, DO [facility]  . IR GENERIC HISTORICAL  05/11/2016   IR US GUIDE VASC ACCESS RIGHT 05/11/2016 Corrie Mckusick, DO [facility]  . IR GENERIC HISTORICAL  05/11/2016   IR RADIOLOGY PERIPHERAL GUIDED IV START 05/11/2016 Corrie Mckusick, DO [facility]  . KIDNEY TRANSPLANT  2009  . LEFT HEART CATH AND CORONARY ANGIOGRAPHY N/A 02/15/2019   Procedure: LEFT HEART CATH AND CORONARY ANGIOGRAPHY;  Surgeon: Martinique, Peter M, MD;  Location: [place] [facility];  Service: Cardiovascular;  Laterality: N/A;  . LEFT HEART CATH AND CORS/GRAFTS ANGIOGRAPHY N/A 12/04/2017   Procedure: LEFT HEART CATH AND CORS/GRAFTS ANGIOGRAPHY;  Surgeon: Belva Crome, MD;  Location: [place] [facility];  Service: Cardiovascular;  Laterality: N/A;  . NEPHRECTOMY  2017   transplant rejected   . PERIPHERAL VASCULAR CATHETERIZATION N/A 06/04/2016   Procedure:  Upper Extremity Venography;  Surgeon: Waynetta Sandy, MD;  Location: [place] [facility];  Service: Cardiovascular;  Laterality: N/A;  . PERIPHERAL VASCULAR INTERVENTION  04/13/2017   Procedure: PERIPHERAL VASCULAR INTERVENTION;  Surgeon: Conrad Occoquan, MD;  Location: [place] [facility];  Service: Cardiovascular;;  Lt. Common/Exernal  Iliac  . THROMBECTOMY AND REVISION OF ARTERIOVENTOUS (AV) GORETEX  GRAFT Left 10/08/2017   Procedure: THROMBECTOMY of ARTERIOVENTOUS (AV) GORETEX  GRAFT LEFT UPPER ARM;  Surgeon: Conrad West Pasco, MD;  Location: [place];  Service: Vascular;  Laterality: Left;  . THROMBECTOMY W/ EMBOLECTOMY Left 09/14/2017   Procedure: THROMBECTOMY ARTERIOVENOUS GORE-TEX GRAFT LEFT UPPER ARM;  Surgeon: Rosetta Posner, MD;  Location: [place];  Service: Vascular;  Laterality: Left;  . UPPER EXTREMITY ANGIOGRAPHY  Bilateral 10/29/2018   Procedure: UPPER EXTREMITY ANGIOGRAPHY;  Surgeon: Marty Heck, MD;  Location: [place] [facility];  Service: Cardiovascular;  Laterality: Bilateral;  . UPPER EXTREMITY VENOGRAPHY N/A 11/16/2017   Procedure: UPPER EXTREMITY VENOGRAPHY - Right Arm;  Surgeon: Conrad Hope, MD;  Location: [place] [facility];  Service: Cardiovascular;  Laterality: N/A;  . UPPER EXTREMITY VENOGRAPHY Bilateral 10/29/2018   Procedure: UPPER EXTREMITY VENOGRAPHY;  Surgeon: Marty Heck, MD;  Location: [place] [facility];  Service: Cardiovascular;  Laterality: Bilateral;    Allergies  Allergen Reactions  . Baclofen Other (See Comments)    Possibly stroke like symptoms  . Iodinated Diagnostic Agents Swelling, Rash and Other (See Comments)    Other Reaction: burning to mouth, swelling of lips  . Lipitor [Atorvastatin] Other (See Comments)    Leg pain  . Metoprolol Other (See Comments)    Headaches     Current Outpatient Medications  Medication Sig Dispense Refill  . acetaminophen (TYLENOL) 500 MG tablet Take 1,000 mg by mouth every 6 (six) hours as needed for  moderate pain or headache.    . albuterol (PROVENTIL HFA;VENTOLIN HFA) 108 (90 Base) MCG/ACT inhaler Inhale 1-2 puffs into the lungs every 6 (six) hours as needed for wheezing or shortness of breath. (Patient taking differently: Inhale 2 puffs into the lungs every 6 (six) hours as needed for wheezing or shortness of breath. ) 1 Inhaler 0  . aspirin EC 81 MG tablet Take 81 mg by mouth daily.     . calcium acetate (PHOSLO) 667 MG capsule Take 2,668 mg by mouth daily with supper.     . Calcium Carbonate-Vitamin D (CALCIUM 500/D PO) Take 3 tablets by mouth daily.     . clopidogrel (PLAVIX) 75 MG tablet Take 1 tablet (75 mg total) by mouth daily. 90 tablet 3  . Colchicine 0.6 MG CAPS Take 0.6 mg by mouth See admin instructions. Take 0.6 mg when first symptoms of gout flare occur, then take another 0.6 mg 14 days later as needed for gout    . dextromethorphan (DELSYM) 30 MG/5ML liquid Take 30 mg by mouth as needed for cough.    . docusate sodium (COLACE) 100 MG capsule Take 100 mg by mouth daily as needed for mild constipation.    Marland Kitchen ezetimibe (ZETIA) 10 MG tablet Take 10 mg by mouth daily.     Marland Kitchen guaiFENesin (MUCINEX) 600 MG 12 hr tablet Take 600 mg by mouth 2 (two) times daily as needed for cough.    . isosorbide mononitrate (IMDUR) 30 MG 24 hr tablet Take 1 tablet (30 mg total) by mouth daily. 90 tablet 3  . midodrine (PROAMATINE) 10 MG tablet Take 10 mg by mouth every Monday, Wednesday, and Friday. Dialysis days only    . multivitamin (RENA-VIT) TABS tablet Take 1 tablet by mouth daily.     . nitroGLYCERIN (NITROSTAT) 0.4 MG SL tablet Place 1 tablet (0.4 mg total) under the tongue every 5 (five) minutes as needed for chest pain. 25 tablet 5  . oxyCODONE-acetaminophen (PERCOCET/ROXICET) 5-325 MG tablet Take 1 tablet by mouth every 6 (six) hours as needed. 12 tablet 0  . pantoprazole (PROTONIX) 40 MG tablet Take 1 tablet (40 mg total) by mouth daily. 90 tablet 3  . repaglinide (PRANDIN) 0.5 MG tablet Take  1 tablet (0.5 mg total) by mouth 2 (two) times daily before a meal. 180 tablet 3  . rosuvastatin (CRESTOR) 10 MG tablet Take 1 tablet (10  mg total) by mouth daily. 90 tablet 3   No current facility-administered medications for this visit.      REVIEW OF SYSTEMS: see HPI for pertinent positives and negatives    PHYSICAL EXAMINATION:  Vitals:   02/25/19 1329  BP: (!) 86/56  Pulse: 90  Resp: 16  Temp: (!) 97.1 F (36.2 C)  TempSrc: Temporal  SpO2: 95%  Weight: 218 lb (98.9 kg)  Height: 5' 6.5" (1.689 m)   Body mass index is 34.66 kg/m.  General: Obese male in NAD  HEENT:  No gross abnormalities. Large neck Pulmonary: Respirations are non-labored Abdomen: Soft and non-tender  Musculoskeletal: There are no major deformities.   Neurologic: No focal weakness or paresthesias are detected, bilateral hand grip strength is 5/5.  Skin: There are no ulcer or rashes noted. Psychiatric: The patient has normal affect. Cardiovascular: There is a regular rate and rhythm without significant murmur appreciated.  Good bruit auscultated at the entirety of left upper arm AVG, no thrill palpated except at the most distal segment.  Bilateral radial pulses are 1+ palpable.    Medical Decision Making  Patrick [name] is a 63 y.o. male who is s/p placement of left upper extremity AV graft (4 mm x 7 mm tapered Gore-Tex graft) on 12-09-18. Pt was previously seen in consultation by Dr. Trula Slade for possible femoral AV graft placement.  Patient has failed bilateral upper extremity graft placements in the past.  Ultimately patient refused femoral graft placement and underwent bilateral upper extremity venogram to evaluate further options.  Right upper extremity venogram showed central venous occlusion.  On the left he did not have a central venous occlusion but did have a previous failed forearm loop graft and upper arm graft.  There was one remaining patent brachial vein and a patent axillary vein so we  recommended an additional attempt at left upper extremity AV graft placement.    He is referred by Dr. Justin Mend for evaluation and treatment of not able to access left upper extremity AV graft.   He is hypotensive now, asymptomatic, had hemodialysis earlier today.   I spoke with Dr. Donzetta Matters re pt HPI; he recommended scheduling a fistulagram to evaluate what the problem is with the AV graft. However, pt declined the fistulagram at this time, he stated that he will let the [facility] staff access the AVG, and hope that it works.  Follow up with [place] as needed.     Clemon Chambers, RN, MSN, FNP-C [facility] Office: [phone]  02/25/2019, 1:39 PM  Clinic MD: Donzetta Matters

## 2019-02-26 DIAGNOSIS — T8610 Unspecified complication of kidney transplant: Secondary | ICD-10-CM | POA: Diagnosis not present

## 2019-02-26 DIAGNOSIS — N186 End stage renal disease: Secondary | ICD-10-CM | POA: Diagnosis not present

## 2019-02-26 DIAGNOSIS — Z992 Dependence on renal dialysis: Secondary | ICD-10-CM | POA: Diagnosis not present

## 2019-02-28 ENCOUNTER — Telehealth (HOSPITAL_COMMUNITY): Payer: Self-pay | Admitting: *Deleted

## 2019-02-28 DIAGNOSIS — Z992 Dependence on renal dialysis: Secondary | ICD-10-CM | POA: Diagnosis not present

## 2019-02-28 DIAGNOSIS — N186 End stage renal disease: Secondary | ICD-10-CM | POA: Diagnosis not present

## 2019-02-28 DIAGNOSIS — D509 Iron deficiency anemia, unspecified: Secondary | ICD-10-CM | POA: Diagnosis not present

## 2019-02-28 DIAGNOSIS — D631 Anemia in chronic kidney disease: Secondary | ICD-10-CM | POA: Diagnosis not present

## 2019-02-28 DIAGNOSIS — N2581 Secondary hyperparathyroidism of renal origin: Secondary | ICD-10-CM | POA: Diagnosis not present

## 2019-02-28 NOTE — Telephone Encounter (Signed)
-----   Message from Fay Records, MD sent at 02/26/2019 11:55 PM EDT ----- Regarding: RE: Ok to participate Onsite for Cardiac rehab with High Covid Risk Score OK to participate if follows precautions at rehab ----- Message ----- From: Rowe Pavy, RN Sent: 02/24/2019   4:06 PM EDT To: Fay Records, MD Subject: Ok to participate Onsite for Cardiac rehab w#  Greetings Dr. [name]  We are seeing patients on-site for cardiac rehab . A great deal of planning with advisement from our Medical Director - Dr. Radford Pax, CV Service line leadership, Infection Disease Control, Facilities, security, recommendations from American Association of Cardiac and Pulmonary Rehab (AACVPR) with the goal for optimal patient safety. Patients will have strict guidelines and criteria they must adhere to and follow. Patients will wear a mask during exercise and practice social distancing. Patients will have to complete screening prior to entry into gym area.   Your patient expressed great interest in participating in facility cardiac rehab. Patient has a COVID-19 risk score of 9. Do you feel this patient is appropriate to resume exercise in facility cardiac rehab? Any additional restrictions you feel are appropriate for this patient?  Pt has hemodialysis MWF.  Pt seen in follow up 7/30 and will see Dr. [name] in September for PAD evaluation.  Thank you and we appreciate your input  Maurice Small RN, BSN Cardiac and Pulmonary Rehab Nurse Navigator   Cardiac Rehab Staff

## 2019-03-02 DIAGNOSIS — N2581 Secondary hyperparathyroidism of renal origin: Secondary | ICD-10-CM | POA: Diagnosis not present

## 2019-03-02 DIAGNOSIS — Z992 Dependence on renal dialysis: Secondary | ICD-10-CM | POA: Diagnosis not present

## 2019-03-02 DIAGNOSIS — N186 End stage renal disease: Secondary | ICD-10-CM | POA: Diagnosis not present

## 2019-03-02 DIAGNOSIS — D509 Iron deficiency anemia, unspecified: Secondary | ICD-10-CM | POA: Diagnosis not present

## 2019-03-02 DIAGNOSIS — D631 Anemia in chronic kidney disease: Secondary | ICD-10-CM | POA: Diagnosis not present

## 2019-03-03 ENCOUNTER — Encounter (HOSPITAL_COMMUNITY): Payer: Self-pay

## 2019-03-03 ENCOUNTER — Other Ambulatory Visit: Payer: Self-pay | Admitting: Physician Assistant

## 2019-03-04 DIAGNOSIS — D631 Anemia in chronic kidney disease: Secondary | ICD-10-CM | POA: Diagnosis not present

## 2019-03-04 DIAGNOSIS — Z992 Dependence on renal dialysis: Secondary | ICD-10-CM | POA: Diagnosis not present

## 2019-03-04 DIAGNOSIS — N2581 Secondary hyperparathyroidism of renal origin: Secondary | ICD-10-CM | POA: Diagnosis not present

## 2019-03-04 DIAGNOSIS — N186 End stage renal disease: Secondary | ICD-10-CM | POA: Diagnosis not present

## 2019-03-04 DIAGNOSIS — D509 Iron deficiency anemia, unspecified: Secondary | ICD-10-CM | POA: Diagnosis not present

## 2019-03-07 DIAGNOSIS — Z992 Dependence on renal dialysis: Secondary | ICD-10-CM | POA: Diagnosis not present

## 2019-03-07 DIAGNOSIS — N186 End stage renal disease: Secondary | ICD-10-CM | POA: Diagnosis not present

## 2019-03-07 DIAGNOSIS — D509 Iron deficiency anemia, unspecified: Secondary | ICD-10-CM | POA: Diagnosis not present

## 2019-03-07 DIAGNOSIS — N2581 Secondary hyperparathyroidism of renal origin: Secondary | ICD-10-CM | POA: Diagnosis not present

## 2019-03-07 DIAGNOSIS — D631 Anemia in chronic kidney disease: Secondary | ICD-10-CM | POA: Diagnosis not present

## 2019-03-09 DIAGNOSIS — N2581 Secondary hyperparathyroidism of renal origin: Secondary | ICD-10-CM | POA: Diagnosis not present

## 2019-03-09 DIAGNOSIS — D509 Iron deficiency anemia, unspecified: Secondary | ICD-10-CM | POA: Diagnosis not present

## 2019-03-09 DIAGNOSIS — N186 End stage renal disease: Secondary | ICD-10-CM | POA: Diagnosis not present

## 2019-03-09 DIAGNOSIS — Z992 Dependence on renal dialysis: Secondary | ICD-10-CM | POA: Diagnosis not present

## 2019-03-09 DIAGNOSIS — D631 Anemia in chronic kidney disease: Secondary | ICD-10-CM | POA: Diagnosis not present

## 2019-03-11 DIAGNOSIS — D509 Iron deficiency anemia, unspecified: Secondary | ICD-10-CM | POA: Diagnosis not present

## 2019-03-11 DIAGNOSIS — N186 End stage renal disease: Secondary | ICD-10-CM | POA: Diagnosis not present

## 2019-03-11 DIAGNOSIS — Z992 Dependence on renal dialysis: Secondary | ICD-10-CM | POA: Diagnosis not present

## 2019-03-11 DIAGNOSIS — D631 Anemia in chronic kidney disease: Secondary | ICD-10-CM | POA: Diagnosis not present

## 2019-03-11 DIAGNOSIS — N2581 Secondary hyperparathyroidism of renal origin: Secondary | ICD-10-CM | POA: Diagnosis not present

## 2019-03-14 DIAGNOSIS — N186 End stage renal disease: Secondary | ICD-10-CM | POA: Diagnosis not present

## 2019-03-14 DIAGNOSIS — N2581 Secondary hyperparathyroidism of renal origin: Secondary | ICD-10-CM | POA: Diagnosis not present

## 2019-03-14 DIAGNOSIS — D509 Iron deficiency anemia, unspecified: Secondary | ICD-10-CM | POA: Diagnosis not present

## 2019-03-14 DIAGNOSIS — Z992 Dependence on renal dialysis: Secondary | ICD-10-CM | POA: Diagnosis not present

## 2019-03-14 DIAGNOSIS — D631 Anemia in chronic kidney disease: Secondary | ICD-10-CM | POA: Diagnosis not present

## 2019-03-16 DIAGNOSIS — D509 Iron deficiency anemia, unspecified: Secondary | ICD-10-CM | POA: Diagnosis not present

## 2019-03-16 DIAGNOSIS — N2581 Secondary hyperparathyroidism of renal origin: Secondary | ICD-10-CM | POA: Diagnosis not present

## 2019-03-16 DIAGNOSIS — N186 End stage renal disease: Secondary | ICD-10-CM | POA: Diagnosis not present

## 2019-03-16 DIAGNOSIS — D631 Anemia in chronic kidney disease: Secondary | ICD-10-CM | POA: Diagnosis not present

## 2019-03-16 DIAGNOSIS — Z992 Dependence on renal dialysis: Secondary | ICD-10-CM | POA: Diagnosis not present

## 2019-03-18 DIAGNOSIS — D509 Iron deficiency anemia, unspecified: Secondary | ICD-10-CM | POA: Diagnosis not present

## 2019-03-18 DIAGNOSIS — D631 Anemia in chronic kidney disease: Secondary | ICD-10-CM | POA: Diagnosis not present

## 2019-03-18 DIAGNOSIS — N186 End stage renal disease: Secondary | ICD-10-CM | POA: Diagnosis not present

## 2019-03-18 DIAGNOSIS — Z992 Dependence on renal dialysis: Secondary | ICD-10-CM | POA: Diagnosis not present

## 2019-03-18 DIAGNOSIS — N2581 Secondary hyperparathyroidism of renal origin: Secondary | ICD-10-CM | POA: Diagnosis not present

## 2019-03-21 DIAGNOSIS — N186 End stage renal disease: Secondary | ICD-10-CM | POA: Diagnosis not present

## 2019-03-21 DIAGNOSIS — N2581 Secondary hyperparathyroidism of renal origin: Secondary | ICD-10-CM | POA: Diagnosis not present

## 2019-03-21 DIAGNOSIS — D631 Anemia in chronic kidney disease: Secondary | ICD-10-CM | POA: Diagnosis not present

## 2019-03-21 DIAGNOSIS — D509 Iron deficiency anemia, unspecified: Secondary | ICD-10-CM | POA: Diagnosis not present

## 2019-03-21 DIAGNOSIS — Z992 Dependence on renal dialysis: Secondary | ICD-10-CM | POA: Diagnosis not present

## 2019-03-23 DIAGNOSIS — N186 End stage renal disease: Secondary | ICD-10-CM | POA: Diagnosis not present

## 2019-03-23 DIAGNOSIS — N2581 Secondary hyperparathyroidism of renal origin: Secondary | ICD-10-CM | POA: Diagnosis not present

## 2019-03-23 DIAGNOSIS — D631 Anemia in chronic kidney disease: Secondary | ICD-10-CM | POA: Diagnosis not present

## 2019-03-23 DIAGNOSIS — D509 Iron deficiency anemia, unspecified: Secondary | ICD-10-CM | POA: Diagnosis not present

## 2019-03-23 DIAGNOSIS — Z992 Dependence on renal dialysis: Secondary | ICD-10-CM | POA: Diagnosis not present

## 2019-03-24 DIAGNOSIS — Z452 Encounter for adjustment and management of vascular access device: Secondary | ICD-10-CM | POA: Diagnosis not present

## 2019-03-25 DIAGNOSIS — D631 Anemia in chronic kidney disease: Secondary | ICD-10-CM | POA: Diagnosis not present

## 2019-03-25 DIAGNOSIS — N186 End stage renal disease: Secondary | ICD-10-CM | POA: Diagnosis not present

## 2019-03-25 DIAGNOSIS — Z992 Dependence on renal dialysis: Secondary | ICD-10-CM | POA: Diagnosis not present

## 2019-03-25 DIAGNOSIS — D509 Iron deficiency anemia, unspecified: Secondary | ICD-10-CM | POA: Diagnosis not present

## 2019-03-25 DIAGNOSIS — N2581 Secondary hyperparathyroidism of renal origin: Secondary | ICD-10-CM | POA: Diagnosis not present

## 2019-03-28 DIAGNOSIS — D509 Iron deficiency anemia, unspecified: Secondary | ICD-10-CM | POA: Diagnosis not present

## 2019-03-28 DIAGNOSIS — D631 Anemia in chronic kidney disease: Secondary | ICD-10-CM | POA: Diagnosis not present

## 2019-03-28 DIAGNOSIS — N2581 Secondary hyperparathyroidism of renal origin: Secondary | ICD-10-CM | POA: Diagnosis not present

## 2019-03-28 DIAGNOSIS — Z992 Dependence on renal dialysis: Secondary | ICD-10-CM | POA: Diagnosis not present

## 2019-03-28 DIAGNOSIS — N186 End stage renal disease: Secondary | ICD-10-CM | POA: Diagnosis not present

## 2019-03-29 DIAGNOSIS — T8610 Unspecified complication of kidney transplant: Secondary | ICD-10-CM | POA: Diagnosis not present

## 2019-03-29 DIAGNOSIS — N186 End stage renal disease: Secondary | ICD-10-CM | POA: Diagnosis not present

## 2019-03-29 DIAGNOSIS — Z992 Dependence on renal dialysis: Secondary | ICD-10-CM | POA: Diagnosis not present

## 2019-03-30 DIAGNOSIS — N2581 Secondary hyperparathyroidism of renal origin: Secondary | ICD-10-CM | POA: Diagnosis not present

## 2019-03-30 DIAGNOSIS — Z992 Dependence on renal dialysis: Secondary | ICD-10-CM | POA: Diagnosis not present

## 2019-03-30 DIAGNOSIS — N186 End stage renal disease: Secondary | ICD-10-CM | POA: Diagnosis not present

## 2019-03-30 DIAGNOSIS — D509 Iron deficiency anemia, unspecified: Secondary | ICD-10-CM | POA: Diagnosis not present

## 2019-03-30 DIAGNOSIS — Z23 Encounter for immunization: Secondary | ICD-10-CM | POA: Diagnosis not present

## 2019-04-01 DIAGNOSIS — Z992 Dependence on renal dialysis: Secondary | ICD-10-CM | POA: Diagnosis not present

## 2019-04-01 DIAGNOSIS — N186 End stage renal disease: Secondary | ICD-10-CM | POA: Diagnosis not present

## 2019-04-01 DIAGNOSIS — Z23 Encounter for immunization: Secondary | ICD-10-CM | POA: Diagnosis not present

## 2019-04-01 DIAGNOSIS — N2581 Secondary hyperparathyroidism of renal origin: Secondary | ICD-10-CM | POA: Diagnosis not present

## 2019-04-01 DIAGNOSIS — D509 Iron deficiency anemia, unspecified: Secondary | ICD-10-CM | POA: Diagnosis not present

## 2019-04-04 DIAGNOSIS — Z992 Dependence on renal dialysis: Secondary | ICD-10-CM | POA: Diagnosis not present

## 2019-04-04 DIAGNOSIS — Z23 Encounter for immunization: Secondary | ICD-10-CM | POA: Diagnosis not present

## 2019-04-04 DIAGNOSIS — D509 Iron deficiency anemia, unspecified: Secondary | ICD-10-CM | POA: Diagnosis not present

## 2019-04-04 DIAGNOSIS — N2581 Secondary hyperparathyroidism of renal origin: Secondary | ICD-10-CM | POA: Diagnosis not present

## 2019-04-04 DIAGNOSIS — N186 End stage renal disease: Secondary | ICD-10-CM | POA: Diagnosis not present

## 2019-04-05 ENCOUNTER — Encounter: Payer: Self-pay | Admitting: Cardiovascular Disease

## 2019-04-05 ENCOUNTER — Other Ambulatory Visit: Payer: Self-pay

## 2019-04-05 ENCOUNTER — Ambulatory Visit (INDEPENDENT_AMBULATORY_CARE_PROVIDER_SITE_OTHER): Payer: Medicare Other | Admitting: Cardiovascular Disease

## 2019-04-05 DIAGNOSIS — I70213 Atherosclerosis of native arteries of extremities with intermittent claudication, bilateral legs: Secondary | ICD-10-CM

## 2019-04-05 NOTE — Patient Instructions (Signed)
Medication Instructions:  Your physician recommends that you continue on your current medications as directed. Please refer to the Current Medication list given to you today.  If you need a refill on your cardiac medications before your next appointment, please call your pharmacy.   Lab work: NONE If you have labs (blood work) drawn today and your tests are completely normal, you will receive your results only by: Marland Kitchen MyChart Message (if you have MyChart) OR . A paper copy in the mail If you have any lab test that is abnormal or we need to change your treatment, we will call you to review the results.  Testing/Procedures: Your physician has requested that you have a lower or upper extremity arterial duplex. This test is an ultrasound of the arteries in the legs or arms. It looks at arterial blood flow in the legs and arms. Allow one hour for Lower and Upper Arterial scans. There are no restrictions or special instructions. TO BE SCHEDULED   Your physician has requested that you have an ankle brachial index (ABI). During this test an ultrasound and blood pressure cuff are used to evaluate the arteries that supply the arms and legs with blood. Allow thirty minutes for this exam. There are no restrictions or special instructions. TO BE SCHEDULED    Follow-Up: At [facility], you and your health needs are our priority.  As part of our continuing mission to provide you with exceptional heart care, we have created designated Provider Care Teams.  These Care Teams include your primary Cardiologist (physician) and Advanced Practice Providers (APPs -  Physician Assistants and Nurse Practitioners) who all work together to provide you with the care you need, when you need it. You will need a follow up appointment in 12 months with Dr. Quay Burow.  Please call our office 2 months in advance to schedule this/each appointment.

## 2019-04-05 NOTE — Progress Notes (Signed)
04/05/2019 Patrick [name]   1956/04/11  [id number]  Primary Physician Nolene Ebbs, MD Primary Cardiologist: Lorretta Harp MD Lupe Carney, [place]  HPI:  Patrick [name] is a 63 y.o. 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  His cardiologist is Dr. Dorris [name].  He has a history of remote tobacco abuse having quit 6 years ago, treated hypertension hyperlipidemia.  He has end-stage renal disease on hemodialysis having failed renal transplant remotely.  He had CABG x1 in 1997 with a LIMA to his LAD and recent cath performed by Dr. Martinique 02/16/2019 revealing a chronically occluded LAD, atretic LIMA with high-grade calcified mid and distal RCA stenosis.  He underwent orbital atherectomy, PCI drug-eluting stenting using 2 stents.  Symptoms of chest pain and shortness of breath improved after that.  He does have a history of PAD status post peripheral angiography and intervention by Dr. Adele Barthel 04/13/2017 with stenting of his proximal left external iliac artery.  Runoff revealed occluded SFAs bilaterally, highly calcified with popliteal and tibial vessel disease as well.  He complains of lifestyle limiting claudication which is bilateral and symmetric.   Current Meds  Medication Sig  . acetaminophen (TYLENOL) 500 MG tablet Take 1,000 mg by mouth every 6 (six) hours as needed for moderate pain or headache.  . albuterol (PROVENTIL HFA;VENTOLIN HFA) 108 (90 Base) MCG/ACT inhaler Inhale 1-2 puffs into the lungs every 6 (six) hours as needed for wheezing or shortness of breath. (Patient taking differently: Inhale 2 puffs into the lungs every 6 (six) hours as needed for wheezing or shortness of breath. )  . aspirin EC 81 MG tablet Take 81 mg by mouth daily.   . calcium acetate (PHOSLO) 667 MG capsule Take 2,668 mg by  mouth daily with supper.   . Calcium Carbonate-Vitamin D (CALCIUM 500/D PO) Take 3 tablets by mouth daily.   . clopidogrel (PLAVIX) 75 MG tablet TAKE 1 TABLET(75 MG) BY MOUTH DAILY  . Colchicine 0.6 MG CAPS Take 0.6 mg by mouth See admin instructions. Take 0.6 mg when first symptoms of gout flare occur, then take another 0.6 mg 14 days later as needed for gout  . dextromethorphan (DELSYM) 30 MG/5ML liquid Take 30 mg by mouth as needed for cough.  . docusate sodium (COLACE) 100 MG capsule Take 100 mg by mouth daily as needed for mild constipation.  Marland Kitchen ezetimibe (ZETIA) 10 MG tablet Take 10 mg by mouth daily.   Marland Kitchen guaiFENesin (MUCINEX) 600 MG 12 hr tablet Take 600 mg by mouth 2 (two) times daily as needed for cough.  . isosorbide mononitrate (IMDUR) 30 MG 24 hr tablet Take 1 tablet (30 mg total) by mouth daily.  . multivitamin (RENA-VIT) TABS tablet Take 1 tablet by mouth daily.   . nitroGLYCERIN (NITROSTAT) 0.4 MG SL tablet Place 1 tablet (0.4 mg total) under the tongue every 5 (five) minutes as needed for chest pain.  Marland Kitchen oxyCODONE-acetaminophen (PERCOCET/ROXICET) 5-325 MG tablet Take 1 tablet by mouth every 6 (six) hours as needed.  . pantoprazole (PROTONIX) 40 MG tablet Take 1 tablet (40 mg total) by mouth daily.  . repaglinide (PRANDIN) 0.5 MG tablet Take 1 tablet (0.5 mg total) by mouth 2 (two) times daily before a meal.  . rosuvastatin (CRESTOR) 10 MG tablet Take 1 tablet (10 mg total) by mouth  daily.     Allergies  Allergen Reactions  . Baclofen Other (See Comments)    Possibly stroke like symptoms  . Iodinated Diagnostic Agents Swelling, Rash and Other (See Comments)    Other Reaction: burning to mouth, swelling of lips  . Lipitor [Atorvastatin] Other (See Comments)    Leg pain  . Metoprolol Other (See Comments)    Headaches     Social History   Socioeconomic History  . Marital status: Married    Spouse name: Patrick [name]  . Number of children: 5  . Years of education: 24  . Highest  education level: 12th grade  Occupational History  . Occupation: DISABLED  Social Needs  . Financial resource strain: Not on file  . Food insecurity    Worry: Never true    Inability: Never true  . Transportation needs    Medical: No    Non-medical: No  Tobacco Use  . Smoking status: Former Smoker    Types: Cigarettes    Quit date: 07/04/2014    Years since quitting: 4.7  . Smokeless tobacco: Never Used  . Tobacco comment: "smoked ~ 1 pack/month when I did smoke; never a steady smoker"  Substance and Sexual Activity  . Alcohol use: Yes    Alcohol/week: 0.0 standard drinks    Comment: rare  "2 drinks, 1-2 times/year"  . Drug use: No  . Sexual activity: Not Currently    Birth control/protection: None  Lifestyle  . Physical activity    Days per week: 0 days    Minutes per session: 0 min  . Stress: Not at all  Relationships  . Social connections    Talks on phone: More than three times a week    Gets together: More than three times a week    Attends religious service: Never    Active member of club or organization: No    Attends meetings of clubs or organizations: Never    Relationship status: Married  . Intimate partner violence    Fear of current or ex partner: No    Emotionally abused: No    Physically abused: No    Forced sexual activity: No  Other Topics Concern  . Not on file  Social History Narrative  . Not on file     Review of Systems: General: negative for chills, fever, night sweats or weight changes.  Cardiovascular: negative for chest pain, dyspnea on exertion, edema, orthopnea, palpitations, paroxysmal nocturnal dyspnea or shortness of breath Dermatological: negative for rash Respiratory: negative for cough or wheezing Urologic: negative for hematuria Abdominal: negative for nausea, vomiting, diarrhea, bright red blood per rectum, melena, or hematemesis Neurologic: negative for visual changes, syncope, or dizziness All other systems reviewed and are  otherwise negative except as noted above.    Blood pressure 103/66, pulse 86, height 5' 6.5" (1.689 m), weight 218 lb 9.6 oz (99.2 kg), SpO2 95 %.  General appearance: alert and no distress Neck: no adenopathy, no carotid bruit, no JVD, supple, symmetrical, trachea midline and thyroid not enlarged, symmetric, no tenderness/mass/nodules Lungs: clear to auscultation bilaterally Heart: regular rate and rhythm, S1, S2 normal, no murmur, click, rub or gallop Extremities: extremities normal, atraumatic, no cyanosis or edema Pulses: Absent pedal pulses bilaterally Skin: Skin color, texture, turgor normal. No rashes or lesions Neurologic: Alert and oriented X 3, normal strength and tone. Normal symmetric reflexes. Normal coordination and gait  EKG not performed today  ASSESSMENT AND PLAN:   Atherosclerosis of native arteries of extremity  with intermittent claudication [facility]) Mr. Risi  is referred to me by Patrick Perch, NP for evaluation of symptomatic PAD.  He has risk factors including discontinue tobacco abuse, treated hypertension and hyperlipidemia.  He does have CAD.  He has a history of PAD status post peripheral angiography and intervention by Dr. Adele Barthel 04/13/2017.  He had a stent placed in his proximal left external iliac artery.  Runoff revealed extensive SFA, popliteal and tibial vessel disease with occluded SFAs bilaterally, highly calcified.  I reviewed his angiograms and do not feel he is a good candidate for percutaneous intervention.  We will recheck lower extremity arterial Doppler studies to ensure that his stent still patent.      Lorretta Harp MD FACP,FACC,FAHA, [facility] 04/05/2019 10:47 AM

## 2019-04-05 NOTE — Assessment & Plan Note (Signed)
Patrick [name]  is referred to me by Daune Perch, NP for evaluation of symptomatic PAD.  He has risk factors including discontinue tobacco abuse, treated hypertension and hyperlipidemia.  He does have CAD.  He has a history of PAD status post peripheral angiography and intervention by Dr. Adele Barthel 04/13/2017.  He had a stent placed in his proximal left external iliac artery.  Runoff revealed extensive SFA, popliteal and tibial vessel disease with occluded SFAs bilaterally, highly calcified.  I reviewed his angiograms and do not feel he is a good candidate for percutaneous intervention.  We will recheck lower extremity arterial Doppler studies to ensure that his stent still patent.

## 2019-04-06 DIAGNOSIS — N186 End stage renal disease: Secondary | ICD-10-CM | POA: Diagnosis not present

## 2019-04-06 DIAGNOSIS — Z992 Dependence on renal dialysis: Secondary | ICD-10-CM | POA: Diagnosis not present

## 2019-04-06 DIAGNOSIS — N2581 Secondary hyperparathyroidism of renal origin: Secondary | ICD-10-CM | POA: Diagnosis not present

## 2019-04-06 DIAGNOSIS — Z23 Encounter for immunization: Secondary | ICD-10-CM | POA: Diagnosis not present

## 2019-04-06 DIAGNOSIS — D509 Iron deficiency anemia, unspecified: Secondary | ICD-10-CM | POA: Diagnosis not present

## 2019-04-08 ENCOUNTER — Other Ambulatory Visit (HOSPITAL_COMMUNITY): Payer: Self-pay | Admitting: Cardiovascular Disease

## 2019-04-08 DIAGNOSIS — Z95828 Presence of other vascular implants and grafts: Secondary | ICD-10-CM

## 2019-04-08 DIAGNOSIS — N2581 Secondary hyperparathyroidism of renal origin: Secondary | ICD-10-CM | POA: Diagnosis not present

## 2019-04-08 DIAGNOSIS — Z23 Encounter for immunization: Secondary | ICD-10-CM | POA: Diagnosis not present

## 2019-04-08 DIAGNOSIS — Z992 Dependence on renal dialysis: Secondary | ICD-10-CM | POA: Diagnosis not present

## 2019-04-08 DIAGNOSIS — D509 Iron deficiency anemia, unspecified: Secondary | ICD-10-CM | POA: Diagnosis not present

## 2019-04-08 DIAGNOSIS — N186 End stage renal disease: Secondary | ICD-10-CM | POA: Diagnosis not present

## 2019-04-11 DIAGNOSIS — Z992 Dependence on renal dialysis: Secondary | ICD-10-CM | POA: Diagnosis not present

## 2019-04-11 DIAGNOSIS — N186 End stage renal disease: Secondary | ICD-10-CM | POA: Diagnosis not present

## 2019-04-11 DIAGNOSIS — D509 Iron deficiency anemia, unspecified: Secondary | ICD-10-CM | POA: Diagnosis not present

## 2019-04-11 DIAGNOSIS — N2581 Secondary hyperparathyroidism of renal origin: Secondary | ICD-10-CM | POA: Diagnosis not present

## 2019-04-11 DIAGNOSIS — Z23 Encounter for immunization: Secondary | ICD-10-CM | POA: Diagnosis not present

## 2019-04-13 DIAGNOSIS — N2581 Secondary hyperparathyroidism of renal origin: Secondary | ICD-10-CM | POA: Diagnosis not present

## 2019-04-13 DIAGNOSIS — D509 Iron deficiency anemia, unspecified: Secondary | ICD-10-CM | POA: Diagnosis not present

## 2019-04-13 DIAGNOSIS — Z992 Dependence on renal dialysis: Secondary | ICD-10-CM | POA: Diagnosis not present

## 2019-04-13 DIAGNOSIS — N186 End stage renal disease: Secondary | ICD-10-CM | POA: Diagnosis not present

## 2019-04-13 DIAGNOSIS — Z23 Encounter for immunization: Secondary | ICD-10-CM | POA: Diagnosis not present

## 2019-04-15 DIAGNOSIS — N2581 Secondary hyperparathyroidism of renal origin: Secondary | ICD-10-CM | POA: Diagnosis not present

## 2019-04-15 DIAGNOSIS — D509 Iron deficiency anemia, unspecified: Secondary | ICD-10-CM | POA: Diagnosis not present

## 2019-04-15 DIAGNOSIS — Z23 Encounter for immunization: Secondary | ICD-10-CM | POA: Diagnosis not present

## 2019-04-15 DIAGNOSIS — N186 End stage renal disease: Secondary | ICD-10-CM | POA: Diagnosis not present

## 2019-04-15 DIAGNOSIS — Z992 Dependence on renal dialysis: Secondary | ICD-10-CM | POA: Diagnosis not present

## 2019-04-18 DIAGNOSIS — D509 Iron deficiency anemia, unspecified: Secondary | ICD-10-CM | POA: Diagnosis not present

## 2019-04-18 DIAGNOSIS — N2581 Secondary hyperparathyroidism of renal origin: Secondary | ICD-10-CM | POA: Diagnosis not present

## 2019-04-18 DIAGNOSIS — Z992 Dependence on renal dialysis: Secondary | ICD-10-CM | POA: Diagnosis not present

## 2019-04-18 DIAGNOSIS — Z23 Encounter for immunization: Secondary | ICD-10-CM | POA: Diagnosis not present

## 2019-04-18 DIAGNOSIS — N186 End stage renal disease: Secondary | ICD-10-CM | POA: Diagnosis not present

## 2019-04-20 DIAGNOSIS — N2581 Secondary hyperparathyroidism of renal origin: Secondary | ICD-10-CM | POA: Diagnosis not present

## 2019-04-20 DIAGNOSIS — Z992 Dependence on renal dialysis: Secondary | ICD-10-CM | POA: Diagnosis not present

## 2019-04-20 DIAGNOSIS — Z23 Encounter for immunization: Secondary | ICD-10-CM | POA: Diagnosis not present

## 2019-04-20 DIAGNOSIS — N186 End stage renal disease: Secondary | ICD-10-CM | POA: Diagnosis not present

## 2019-04-20 DIAGNOSIS — D509 Iron deficiency anemia, unspecified: Secondary | ICD-10-CM | POA: Diagnosis not present

## 2019-04-21 ENCOUNTER — Ambulatory Visit (HOSPITAL_COMMUNITY)
Admission: RE | Admit: 2019-04-21 | Discharge: 2019-04-21 | Disposition: A | Discharge status: Home / Self Care | Payer: Medicare Other | Source: Ambulatory Visit | Attending: Cardiology | Admitting: Cardiology

## 2019-04-21 ENCOUNTER — Ambulatory Visit (HOSPITAL_BASED_OUTPATIENT_CLINIC_OR_DEPARTMENT_OTHER)
Admission: RE | Admit: 2019-04-21 | Discharge: 2019-04-21 | Disposition: A | Discharge status: Home / Self Care | Payer: Medicare Other | Source: Ambulatory Visit | Attending: Cardiovascular Disease | Admitting: Cardiovascular Disease

## 2019-04-21 ENCOUNTER — Other Ambulatory Visit: Payer: Self-pay

## 2019-04-21 DIAGNOSIS — I70213 Atherosclerosis of native arteries of extremities with intermittent claudication, bilateral legs: Secondary | ICD-10-CM | POA: Diagnosis not present

## 2019-04-21 DIAGNOSIS — Z95828 Presence of other vascular implants and grafts: Secondary | ICD-10-CM | POA: Diagnosis not present

## 2019-04-22 ENCOUNTER — Other Ambulatory Visit: Payer: Self-pay | Admitting: *Deleted

## 2019-04-22 DIAGNOSIS — D509 Iron deficiency anemia, unspecified: Secondary | ICD-10-CM | POA: Diagnosis not present

## 2019-04-22 DIAGNOSIS — N2581 Secondary hyperparathyroidism of renal origin: Secondary | ICD-10-CM | POA: Diagnosis not present

## 2019-04-22 DIAGNOSIS — Z992 Dependence on renal dialysis: Secondary | ICD-10-CM | POA: Diagnosis not present

## 2019-04-22 DIAGNOSIS — N186 End stage renal disease: Secondary | ICD-10-CM | POA: Diagnosis not present

## 2019-04-22 DIAGNOSIS — I739 Peripheral vascular disease, unspecified: Secondary | ICD-10-CM

## 2019-04-22 DIAGNOSIS — Z23 Encounter for immunization: Secondary | ICD-10-CM | POA: Diagnosis not present

## 2019-04-25 DIAGNOSIS — N186 End stage renal disease: Secondary | ICD-10-CM | POA: Diagnosis not present

## 2019-04-25 DIAGNOSIS — Z23 Encounter for immunization: Secondary | ICD-10-CM | POA: Diagnosis not present

## 2019-04-25 DIAGNOSIS — D509 Iron deficiency anemia, unspecified: Secondary | ICD-10-CM | POA: Diagnosis not present

## 2019-04-25 DIAGNOSIS — N2581 Secondary hyperparathyroidism of renal origin: Secondary | ICD-10-CM | POA: Diagnosis not present

## 2019-04-25 DIAGNOSIS — Z992 Dependence on renal dialysis: Secondary | ICD-10-CM | POA: Diagnosis not present

## 2019-04-27 DIAGNOSIS — Z992 Dependence on renal dialysis: Secondary | ICD-10-CM | POA: Diagnosis not present

## 2019-04-27 DIAGNOSIS — Z23 Encounter for immunization: Secondary | ICD-10-CM | POA: Diagnosis not present

## 2019-04-27 DIAGNOSIS — N2581 Secondary hyperparathyroidism of renal origin: Secondary | ICD-10-CM | POA: Diagnosis not present

## 2019-04-27 DIAGNOSIS — D509 Iron deficiency anemia, unspecified: Secondary | ICD-10-CM | POA: Diagnosis not present

## 2019-04-27 DIAGNOSIS — N186 End stage renal disease: Secondary | ICD-10-CM | POA: Diagnosis not present

## 2019-04-28 DIAGNOSIS — N186 End stage renal disease: Secondary | ICD-10-CM | POA: Diagnosis not present

## 2019-04-28 DIAGNOSIS — T8610 Unspecified complication of kidney transplant: Secondary | ICD-10-CM | POA: Diagnosis not present

## 2019-04-28 DIAGNOSIS — Z992 Dependence on renal dialysis: Secondary | ICD-10-CM | POA: Diagnosis not present

## 2019-04-29 DIAGNOSIS — D631 Anemia in chronic kidney disease: Secondary | ICD-10-CM | POA: Diagnosis not present

## 2019-04-29 DIAGNOSIS — N186 End stage renal disease: Secondary | ICD-10-CM | POA: Diagnosis not present

## 2019-04-29 DIAGNOSIS — N2581 Secondary hyperparathyroidism of renal origin: Secondary | ICD-10-CM | POA: Diagnosis not present

## 2019-04-29 DIAGNOSIS — D509 Iron deficiency anemia, unspecified: Secondary | ICD-10-CM | POA: Diagnosis not present

## 2019-04-29 DIAGNOSIS — Z992 Dependence on renal dialysis: Secondary | ICD-10-CM | POA: Diagnosis not present

## 2019-05-02 DIAGNOSIS — D631 Anemia in chronic kidney disease: Secondary | ICD-10-CM | POA: Diagnosis not present

## 2019-05-02 DIAGNOSIS — N186 End stage renal disease: Secondary | ICD-10-CM | POA: Diagnosis not present

## 2019-05-02 DIAGNOSIS — N2581 Secondary hyperparathyroidism of renal origin: Secondary | ICD-10-CM | POA: Diagnosis not present

## 2019-05-02 DIAGNOSIS — Z992 Dependence on renal dialysis: Secondary | ICD-10-CM | POA: Diagnosis not present

## 2019-05-02 DIAGNOSIS — D509 Iron deficiency anemia, unspecified: Secondary | ICD-10-CM | POA: Diagnosis not present

## 2019-05-03 ENCOUNTER — Telehealth: Payer: Self-pay | Admitting: Internal Medicine

## 2019-05-03 NOTE — Telephone Encounter (Signed)
° ° °  Pt c/o of Chest Pain: STAT if CP now or developed within 24 hours   1. Are you having CP right now? no   2. Are you experiencing any other symptoms (ex. SOB, nausea, vomiting, sweating)?no  3. How long have you been experiencing CP?  2 weeks  4. Is your CP continuous or coming and going? Coming and going   5. Have you taken Nitroglycerin? yes ?

## 2019-05-03 NOTE — Telephone Encounter (Signed)
I spoke to the patient who is experiencing CP, which radiates to his left arm.  He said that it is relieved with 1 Nitro.  The pain comes and goes and recently had an intervention by Dr Martinique.  He denies SOB.  He has dialysis on M-W-F so we made an appointment on Thursday with Gae Bon.  He verbalized understanding.

## 2019-05-04 DIAGNOSIS — D509 Iron deficiency anemia, unspecified: Secondary | ICD-10-CM | POA: Diagnosis not present

## 2019-05-04 DIAGNOSIS — Z992 Dependence on renal dialysis: Secondary | ICD-10-CM | POA: Diagnosis not present

## 2019-05-04 DIAGNOSIS — N186 End stage renal disease: Secondary | ICD-10-CM | POA: Diagnosis not present

## 2019-05-04 DIAGNOSIS — D631 Anemia in chronic kidney disease: Secondary | ICD-10-CM | POA: Diagnosis not present

## 2019-05-04 DIAGNOSIS — N2581 Secondary hyperparathyroidism of renal origin: Secondary | ICD-10-CM | POA: Diagnosis not present

## 2019-05-05 ENCOUNTER — Encounter: Payer: Self-pay | Admitting: Cardiology

## 2019-05-05 ENCOUNTER — Other Ambulatory Visit: Payer: Self-pay

## 2019-05-05 ENCOUNTER — Ambulatory Visit (INDEPENDENT_AMBULATORY_CARE_PROVIDER_SITE_OTHER): Payer: Medicare Other | Admitting: Cardiology

## 2019-05-05 VITALS — BP 96/63 | HR 85 | Ht 66.5 in | Wt 218.1 lb

## 2019-05-05 DIAGNOSIS — I208 Other forms of angina pectoris: Secondary | ICD-10-CM | POA: Diagnosis not present

## 2019-05-05 DIAGNOSIS — Z992 Dependence on renal dialysis: Secondary | ICD-10-CM | POA: Diagnosis not present

## 2019-05-05 DIAGNOSIS — I739 Peripheral vascular disease, unspecified: Secondary | ICD-10-CM | POA: Diagnosis not present

## 2019-05-05 DIAGNOSIS — N186 End stage renal disease: Secondary | ICD-10-CM

## 2019-05-05 DIAGNOSIS — E785 Hyperlipidemia, unspecified: Secondary | ICD-10-CM | POA: Diagnosis not present

## 2019-05-05 DIAGNOSIS — I200 Unstable angina: Secondary | ICD-10-CM

## 2019-05-05 DIAGNOSIS — I25119 Atherosclerotic heart disease of native coronary artery with unspecified angina pectoris: Secondary | ICD-10-CM | POA: Diagnosis not present

## 2019-05-05 DIAGNOSIS — I10 Essential (primary) hypertension: Secondary | ICD-10-CM

## 2019-05-05 MED ORDER — METOPROLOL SUCCINATE ER 25 MG PO TB24: 25.0000 mg | ORAL_TABLET | Freq: Every day | ORAL | 3 refills | Status: DC

## 2019-05-05 NOTE — Patient Instructions (Addendum)
Medication Instructions:  START: Toprol XL 25 mg taking 1 tablet by mouth once a day   If you need a refill on your cardiac medications before your next appointment, please call your pharmacy.   Lab work: None  If you have labs (blood work) drawn today and your tests are completely normal, you will receive your results only by: Marland Kitchen MyChart Message (if you have MyChart) OR . A paper copy in the mail If you have any lab test that is abnormal or we need to change your treatment, we will call you to review the results.  Testing/Procedures: None   Follow-Up: You are scheduled to see Dr. [name] on 05/27/2019 @ 2;00 PM   Any Other Special Instructions Will Be Listed Below (If Applicable).   Lifestyle Modifications to Prevent and Treat Heart Disease -Recommend heart healthy/Mediterranean diet, with whole grains, fruits, vegetables, fish, lean meats, nuts, olive oil and avocado oil.  -Limit salt intake to less than 2000 mg per day.  -Recommend moderate walking, starting slowly with a few minutes and working up to 3-5 times/week for 30-50 minutes each session. Aim for at least 150 minutes.week. Goal should be pace of 3 miles/hours, or walking 1.5 miles in 30 minutes -Recommend avoidance of tobacco products. Avoid excess alcohol. -Keep blood pressure well controlled, ideally less than 130/80.

## 2019-05-05 NOTE — Progress Notes (Signed)
Cardiology Office Note:    Date:  05/05/2019   ID:  Patrick [name], DOB 01-20-1956, MRN [id number]  PCP:  Nolene Ebbs, MD  Cardiologist:  Dorris Carnes, MD  Referring MD: Nolene Ebbs, MD   Chief Complaint  Patient presents with  . Chest Pain    History of Present Illness:    Patrick [name] is a 63 y.o. 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   Patient had cardiac catheterization in May/2018 for angina with finding of atretic LIMA-LAD and occluded LAD with left to left and left to right collaterals.  He had moderate nonobstructive disease elsewhere.  It was felt that his angina was related to his occluded LAD.  Discussion of CTO was undertaken but would be very difficult so medical therapy was elected.  Patient was admitted in 10/2018 with weakness and shortness of breath, found to be hypokalemic with potassium of 2.8.  Troponin was slightly elevated at 1.13, felt to be in the setting of renal insufficiency.  He was recommended for cardiac catheterization but declined at the time.  He continued on aspirin, Plavix, beta-blocker, statin and amlodipine.  Imdur 30 mg was added.  The patient presented to the ED on 02/14/2019 with recurrent angina, progressively worsening over 4 weeks until occurring at rest.  High-sensitivity troponin was 289, follow-up 175.  No ischemic changes on EKG. He was taken for left heart catheterization on 02/15/2019 which noted chronic total occlusion of the mid LAD and high-grade obstructive disease in the mid RCA.  He was brought back to the Cath Lab on 02/16/2019 successful PCI to the mid to distal RCA using orbital atherectomy and stenting with overlapping DES x2.  Recommendation is for dual antiplatelet therapy for at least 1 year. Blood pressure was low  at times and noted will need to be monitored.  BP runs low and drops with dialysis. Takes midodrine on dialysis days, 1/2 hour before treatment and 2 hrs into treatment. He does not feel bad with it. He does not take amlodipine on dialysis days.   When I saw the patient on 02/24/2019 I discontinued amlodipine altogether and decreased Imdur to 30 mg once a day.  Mr. Angotti is here today for complaints of chest pain. His BP has been doing well, mostly in the 90's to 100's. He feels well with it. No longer taking midodrine. Some time in September he started having chest pain again. It has been getting worse over the last 2 weeks. It occurs with activity as little as walking through the house.  Most often it resolves with rest.  He has used NTG a few times, the last was on Monday (today is Thursday). He describes that chest pain as on the left side, gripping and heavy, goes down his arm to his fingers. He has these episodes during sexual relations and has to stop. He has mild shob associated, but not significant. His symptoms are limiting his activity. Has not occurred on dialysis, occ occurs prior to dialysis. Occurs every 2-3 days.   He has trouble laying down to sleep due to chest discomfort. He just feels more comfortable when he sits up. He has no edema or JVD and does not look volume overloaded. He says that they are good with getting all of his fluid off with dialysis.  Cardiac studies   CORONARY ATHERECTOMY 02/16/2019  Conclusion   Mid RCA to Dist RCA lesion is 65% stenosed.  A drug-eluting stent was successfully placed using a STENT SYNERGY DES [id number].  Prox RCA to Mid RCA lesion is 95% stenosed.  A drug-eluting stent was successfully placed using a STENT SYNERGY DES [id number].  Post intervention, there is a 0% residual stenosis.  Post intervention, there is a 0% residual stenosis.  1. Successful PCI of the mid to distal RCA using orbital atherectomy and stenting with overlapping DES  x 2.   Plan: DAPT for at least one year. Anticipate DC tomorrow.    Echo 08/20/2018 Study Conclusions  - Left ventricle: The cavity size was normal. Wall thickness was increased in a pattern of mild LVH. Systolic function was normal. The estimated ejection fraction was in the range of 50% to 55%. Wall motion was normal; there were no regional wall motion abnormalities. Doppler parameters are consistent with abnormal left ventricular relaxation (grade 1 diastolic dysfunction). Doppler parameters are consistent with high ventricular filling pressure. - Aortic valve: There was mild stenosis. There was trivial regurgitation. - Mitral valve: Severely calcified annulus.  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  Total occlusion of the proximal to mid LAD filling late by left to left and right to left collaterals.First diagonal contains 50% proximal narrowing.  Left main trunk is normal.  Circumflex gives origin to 3 obtuse marginal branches. The first obtuse marginal contains 50% proximal narrowing.  Right coronary artery is dominant giving PDA and left ventricular branch. Diffuse calcified 60% stenosis is noted throughout the mid vessel ending in the distal segment. No high-grade focal obstruction is seen.  Left ventriculogram and LV pressures were not recorded.  RECOMMENDATIONS:  Angina is related to total occlusion of the proximal to mid LAD. LIMA to the LAD is atretic. 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  Discussed with patient.     Past Medical History:  Diagnosis Date  . Arthritis   . Atrial flutter ([place])    Ablation 2018  .  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 Erectile dysfunction   . ESRD (end stage renal disease) on dialysis ([place])    Pt on HD 2003 >> transplanted in 2009, back on HD in 2016. [name].   . Essential hypertension   . Gout   . Hernia of abdominal wall   . History of blood transfusion   . History of cardiomyopathy   . History of kidney stones   . History of pneumonia   . Migraine   . Myocardial infarction ([place])    1996  . Pneumonia   . Secondary hyperparathyroidism ([place])   . Type 2 diabetes mellitus ([place])   . Wears glasses     Past Surgical History:  Procedure Laterality Date  . A-FLUTTER ABLATION N/A 09/24/2016   Procedure: A-Flutter Ablation;  Surgeon: Will Meredith Leeds, MD;  Location: [facility];  Service: Cardiovascular;  Laterality: N/A;  . ABDOMINAL AORTOGRAM W/LOWER EXTREMITY N/A 04/13/2017  Procedure: ABDOMINAL AORTOGRAM W/LOWER EXTREMITY;  Surgeon: Conrad Tazewell, MD;  Location: [facility];  Service: Cardiovascular;  Laterality: N/A;  Bilater lower extermity  . APPENDECTOMY    . AV FISTULA PLACEMENT Right 09/18/2014   Procedure: INSERTION OF ARTERIOVENOUS (AV) GORE-TEX GRAFT ARM USING 4-7MM  X 45CM STRETCH GORE-TEX VASCULAR GRAFT;  Surgeon: Rosetta Posner, MD;  Location: [facility];  Service: Vascular;  Laterality: Right;  . AV FISTULA PLACEMENT Left 07/07/2016   Procedure: INSERTION OF LEFT BRACHIAL TO AXILLARY ARTERIOVENOUS (AV) GORE-TEX ARM GRAFT;  Surgeon: Conrad Eastlake, MD;  Location: [facility];  Service: Vascular;  Laterality: Left;  . AV FISTULA PLACEMENT Left 12/09/2018   Procedure: Insertion Of Arteriovenous (Av) Gore-Tex Graft Arm, left arm;  Surgeon: Marty Heck, MD;  Location: [facility];  Service: Vascular;  Laterality: Left;  . CARDIAC CATHETERIZATION  ~ 2016  . COLONOSCOPY    .  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 CORONARY ATHERECTOMY N/A 02/16/2019   Procedure: CORONARY ATHERECTOMY;  Surgeon: Martinique, Peter M, MD;  Location: [facility];  Service: Cardiovascular;  Laterality: N/A;  . EXCHANGE OF A DIALYSIS CATHETER Left 01/11/2018   Procedure: EXCHANGE OF TUNNELED DIALYSIS CATHETER;  Surgeon: Rosetta Posner, MD;  Location: [facility];  Service: Vascular;  Laterality: Left;  . HERNIA REPAIR  2017   with nephrectomy  . INSERTION OF DIALYSIS CATHETER N/A 10/08/2017   Procedure: INSERTION OF TUNNELED DIALYSIS CATHETER;  Surgeon: Conrad Jamestown, MD;  Location: [facility];  Service: Vascular;  Laterality: N/A;  . IR FLUORO GUIDE CV LINE LEFT  03/12/2018  . IR GENERIC HISTORICAL  05/11/2016   IR FLUORO GUIDE CV LINE LEFT 05/11/2016 Corrie Mckusick, DO [facility]  . IR GENERIC HISTORICAL  05/11/2016   IR US GUIDE VASC ACCESS LEFT 05/11/2016 Corrie Mckusick, DO [facility]  . IR GENERIC HISTORICAL  05/11/2016   IR US GUIDE VASC ACCESS RIGHT 05/11/2016 Corrie Mckusick, DO [facility]  . IR GENERIC HISTORICAL  05/11/2016   IR RADIOLOGY PERIPHERAL GUIDED IV START 05/11/2016 Corrie Mckusick, DO [facility]  . KIDNEY TRANSPLANT  2009  . LEFT HEART CATH AND CORONARY ANGIOGRAPHY N/A 02/15/2019   Procedure: LEFT HEART CATH AND CORONARY ANGIOGRAPHY;  Surgeon: Martinique, Peter M, MD;  Location: [facility];  Service: Cardiovascular;  Laterality: N/A;  . LEFT HEART CATH AND CORS/GRAFTS ANGIOGRAPHY N/A 12/04/2017   Procedure: LEFT HEART CATH AND CORS/GRAFTS ANGIOGRAPHY;  Surgeon: Belva Crome, MD;  Location: [facility];  Service: Cardiovascular;  Laterality: N/A;  . NEPHRECTOMY  2017   transplant rejected   . PERIPHERAL VASCULAR CATHETERIZATION N/A 06/04/2016   Procedure: Upper Extremity Venography;  Surgeon: Waynetta Sandy, MD;  Location: [facility];  Service: Cardiovascular;   Laterality: N/A;  . PERIPHERAL VASCULAR INTERVENTION  04/13/2017   Procedure: PERIPHERAL VASCULAR INTERVENTION;  Surgeon: Conrad New Harmony, MD;  Location: [facility];  Service: Cardiovascular;;  Lt. Common/Exernal  Iliac  . THROMBECTOMY AND REVISION OF ARTERIOVENTOUS (AV) GORETEX  GRAFT Left 10/08/2017   Procedure: THROMBECTOMY of ARTERIOVENTOUS (AV) GORETEX  GRAFT LEFT UPPER ARM;  Surgeon: Conrad Powhatan, MD;  Location: [facility];  Service: Vascular;  Laterality: Left;  . THROMBECTOMY W/ EMBOLECTOMY Left 09/14/2017   Procedure: THROMBECTOMY ARTERIOVENOUS GORE-TEX GRAFT LEFT UPPER ARM;  Surgeon: Rosetta Posner, MD;  Location: [facility];  Service: Vascular;  Laterality: Left;  . UPPER EXTREMITY ANGIOGRAPHY Bilateral 10/29/2018   Procedure: UPPER EXTREMITY ANGIOGRAPHY;  Surgeon: Marty Heck, MD;  Location: [facility];  Service: Cardiovascular;  Laterality: Bilateral;  . UPPER EXTREMITY VENOGRAPHY N/A 11/16/2017   Procedure: UPPER EXTREMITY VENOGRAPHY - Right Arm;  Surgeon: Conrad Floyd, MD;  Location: [facility];  Service: Cardiovascular;  Laterality: N/A;  . UPPER EXTREMITY VENOGRAPHY Bilateral 10/29/2018   Procedure: UPPER EXTREMITY VENOGRAPHY;  Surgeon: Marty Heck, MD;  Location: [facility];  Service: Cardiovascular;  Laterality: Bilateral;    Current Medications: Current Meds  Medication Sig  . acetaminophen (TYLENOL) 500 MG tablet Take 1,000 mg by mouth every 6 (six) hours as needed for moderate pain or headache.  . albuterol (PROVENTIL HFA;VENTOLIN HFA) 108 (90 Base) MCG/ACT inhaler Inhale 1-2 puffs into the lungs every 6 (six) hours as needed for wheezing or shortness of breath.  Marland Kitchen aspirin EC 81 MG tablet Take 81 mg by mouth daily.   . calcium acetate (PHOSLO) 667 MG capsule Take 2,668 mg by mouth daily with supper.   . Calcium Carbonate-Vitamin D (CALCIUM 500/D PO) Take 3 tablets by mouth daily.   . clopidogrel (PLAVIX) 75 MG tablet TAKE 1 TABLET(75 MG) BY  MOUTH DAILY  . Colchicine 0.6 MG CAPS Take 0.6 mg by mouth See admin instructions. Take 0.6 mg when first symptoms of gout flare occur, then take another 0.6 mg 14 days later as needed for gout  . dextromethorphan (DELSYM) 30 MG/5ML liquid Take 30 mg by mouth as needed for cough.  . docusate sodium (COLACE) 100 MG capsule Take 100 mg by mouth daily as needed for mild constipation.  Marland Kitchen ezetimibe (ZETIA) 10 MG tablet Take 10 mg by mouth daily.   . isosorbide mononitrate (IMDUR) 30 MG 24 hr tablet Take 1 tablet (30 mg total) by mouth daily.  . multivitamin (RENA-VIT) TABS tablet Take 1 tablet by mouth daily.   . nitroGLYCERIN (NITROSTAT) 0.4 MG SL tablet Place 1 tablet (0.4 mg total) under the tongue every 5 (five) minutes as needed for chest pain.  Marland Kitchen oxyCODONE-acetaminophen (PERCOCET/ROXICET) 5-325 MG tablet Take 1 tablet by mouth every 6 (six) hours as needed.  . pantoprazole (PROTONIX) 40 MG tablet Take 1 tablet (40 mg total) by mouth daily.  . repaglinide (PRANDIN) 0.5 MG tablet Take 1 tablet (0.5 mg total) by mouth 2 (two) times daily before a meal.  . rosuvastatin (CRESTOR) 10 MG tablet Take 1 tablet (10 mg total) by mouth daily.     Allergies:   Baclofen, Iodinated diagnostic agents, Lipitor [atorvastatin], and Metoprolol   Social History   Socioeconomic History  . Marital status: Married    Spouse name: SHEILA  . Number of children: 5  . Years of education: 78  . Highest education level: 12th grade  Occupational History  . Occupation: DISABLED  Social Needs  . Financial resource strain: Not on file  . Food insecurity    Worry: Never true    Inability: Never true  . Transportation needs    Medical: No    Non-medical: No  Tobacco Use  . Smoking status: Former Smoker    Types: Cigarettes    Quit date: 07/04/2014    Years since quitting: 4.8  . Smokeless tobacco: Never Used  . Tobacco comment: "smoked ~ 1 pack/month when I did smoke; never a steady smoker"  Substance and  Sexual Activity  . Alcohol  use: Yes    Alcohol/week: 0.0 standard drinks    Comment: rare  "2 drinks, 1-2 times/year"  . Drug use: No  . Sexual activity: Not Currently    Birth control/protection: None  Lifestyle  . Physical activity    Days per week: 0 days    Minutes per session: 0 min  . Stress: Not at all  Relationships  . Social connections    Talks on phone: More than three times a week    Gets together: More than three times a week    Attends religious service: Never    Active member of club or organization: No    Attends meetings of clubs or organizations: Never    Relationship status: Married  Other Topics Concern  . Not on file  Social History Narrative  . Not on file     Family History: The patient's family history includes HIV in his sister; Hyperlipidemia in his mother; Hypertension in his mother. ROS:   Please see the history of present illness.     All other systems reviewed and are negative.   EKG:  EKG is ordered today.  The ekg ordered today demonstrates normal sinus rhythm with diffuse T wave inversions in the anterior and lateral leads, previously present  Recent Labs: 05/12/2018: Magnesium 1.9 10/27/2018: ALT 34; B Natriuretic Peptide 3,129.6 02/17/2019: BUN 94; Creatinine, Ser 17.03; Hemoglobin 11.4; Platelets 132; Potassium 5.6; Sodium 133   Recent Lipid Panel    Component Value Date/Time   CHOL 122 02/15/2019 0049   CHOL 176 07/02/2018 0735   TRIG 85 02/15/2019 0049   HDL 46 02/15/2019 0049   HDL 39 (L) 07/02/2018 0735   CHOLHDL 2.7 02/15/2019 0049   VLDL 17 02/15/2019 0049   LDLCALC 59 02/15/2019 0049   LDLCALC 89 07/02/2018 0735    Physical Exam:    VS:  BP 96/63   Pulse 85   Ht 5' 6.5" (1.689 m)   Wt 218 lb 1.9 oz (98.9 kg)   BMI 34.68 kg/m     Wt Readings from Last 6 Encounters:  05/05/19 218 lb 1.9 oz (98.9 kg)  04/05/19 218 lb 9.6 oz (99.2 kg)  02/25/19 218 lb (98.9 kg)  02/24/19 216 lb 12.8 oz (98.3 kg)  02/17/19 212 lb  4.9 oz (96.3 kg)  12/09/18 208 lb (94.3 kg)     Physical Exam  Constitutional: He is oriented to person, place, and time. He appears well-developed and well-nourished. No distress.  HENT:  Head: Normocephalic and atraumatic.  Neck: Normal range of motion. Neck supple. No JVD present.  Cardiovascular: Normal rate and regular rhythm.  Presence of murmur versus referred AV fistula sounds  Pulmonary/Chest: Effort normal and breath sounds normal. No respiratory distress. He has no wheezes. He has no rales.  Abdominal: Soft. Bowel sounds are normal.  Musculoskeletal: Normal range of motion.        General: No edema.  Neurological: He is alert and oriented to person, place, and time.  Skin: Skin is warm and dry.  Psychiatric: He has a normal mood and affect. His behavior is normal. Judgment and thought content normal.  Vitals reviewed.    ASSESSMENT:    1. Stable angina ([place])   2. Coronary artery disease involving native coronary artery of native heart with angina pectoris ([place])   3. Essential (primary) hypertension   4. Hyperlipidemia, unspecified hyperlipidemia type   5. ESRD on dialysis ([place])   6. PAD (peripheral artery disease) ([place])  PLAN:    In order of problems listed above:  Chest pain/stable angina -Pt with intermittent left chest heaviness radiating down the left arm, 2-3 times per week, relieved with rest or NTG. No acute EKG changes. Symptoms not consistent with acute stent thrombosis.  BP runs low.  Reviewed with Dr. Lovena Le, DOD. We feel that pt may have antianginal benefit more from addition of low dose BB rather than increase Imdur that will decrease his BP more. Will add low dose of beta blocker (pt had headaches before with metoprolol but willing to try a low dose). If he has trouble tolerating metoprolol could switch to atenolol 12.5 mg BID. He will call if needed.  -Reviewed chest pain precautions and when to call 911. He feels confident that he would know when to  call.  -Will have him follow up in a couple of weeks with Dr. [name].   CAD (anomalous LM off RCA) CABG x 1 1997 -Recent cath for UAP with elevated troponin. 02/16/2019 underwent Orbital Atherectomy and overlapping DES X 2 to mid-distal RCA.  -DAPT for at least 1 year.  -Medical therapy with aspirin 81 mg, Plavix 75 mg, statin, Imdur 30.    Essential hypertension -Patient has been noted to have low blood pressures on dialysis days and was previously taking midodrine.  Amlodipine discontinued and Imdur decreased at office visit on 02/24/2019 due to low blood pressures. BP now stable and is no longer taking midodrine.    Hyperlipidemia -On Crestor 10 mg daily and Zetia 10 mg daily. -Lipid panel on 02/15/2019 shows LDL of 59 which is at goal of less than 70. -Continue current therapy  ESRD -On hemodialysis, per nephrology  PAD -No leg claudication.  Being treated by Dr. [name].  Abdominal aorta study shows patency of left iliac stents, but difficult study due to patient's body habitus.  Plan for repeat study in 12 months.    Medication Adjustments/Labs and Tests Ordered: Current medicines are reviewed at length with the patient today.  Concerns regarding medicines are outlined above. Labs and tests ordered and medication changes are outlined in the patient instructions below:  Patient Instructions  Medication Instructions:  START: Toprol XL 25 mg taking 1 tablet by mouth once a day   If you need a refill on your cardiac medications before your next appointment, please call your pharmacy.   Lab work: None  If you have labs (blood work) drawn today and your tests are completely normal, you will receive your results only by: Marland Kitchen MyChart Message (if you have MyChart) OR . A paper copy in the mail If you have any lab test that is abnormal or we need to change your treatment, we will call you to review the results.  Testing/Procedures: None   Follow-Up: You are scheduled to see Dr. [name] on  05/27/2019 @ 2;00 PM   Any Other Special Instructions Will Be Listed Below (If Applicable).   Lifestyle Modifications to Prevent and Treat Heart Disease -Recommend heart healthy/Mediterranean diet, with whole grains, fruits, vegetables, fish, lean meats, nuts, olive oil and avocado oil.  -Limit salt intake to less than 2000 mg per day.  -Recommend moderate walking, starting slowly with a few minutes and working up to 3-5 times/week for 30-50 minutes each session. Aim for at least 150 minutes.week. Goal should be pace of 3 miles/hours, or walking 1.5 miles in 30 minutes -Recommend avoidance of tobacco products. Avoid excess alcohol. -Keep blood pressure well controlled, ideally less than 130/80.  Signed, Daune Perch, NP  05/05/2019 6:25 PM    [facility]

## 2019-05-06 DIAGNOSIS — Z992 Dependence on renal dialysis: Secondary | ICD-10-CM | POA: Diagnosis not present

## 2019-05-06 DIAGNOSIS — D509 Iron deficiency anemia, unspecified: Secondary | ICD-10-CM | POA: Diagnosis not present

## 2019-05-06 DIAGNOSIS — N2581 Secondary hyperparathyroidism of renal origin: Secondary | ICD-10-CM | POA: Diagnosis not present

## 2019-05-06 DIAGNOSIS — N186 End stage renal disease: Secondary | ICD-10-CM | POA: Diagnosis not present

## 2019-05-06 DIAGNOSIS — D631 Anemia in chronic kidney disease: Secondary | ICD-10-CM | POA: Diagnosis not present

## 2019-05-07 ENCOUNTER — Other Ambulatory Visit: Payer: Self-pay | Admitting: Physician Assistant

## 2019-05-07 ENCOUNTER — Other Ambulatory Visit: Payer: Self-pay | Admitting: Endocrinology

## 2019-05-07 NOTE — Telephone Encounter (Signed)
1.  Please schedule f/u appt 2.  Then please refill x 1, pending that appt.  

## 2019-05-09 DIAGNOSIS — D631 Anemia in chronic kidney disease: Secondary | ICD-10-CM | POA: Diagnosis not present

## 2019-05-09 DIAGNOSIS — Z992 Dependence on renal dialysis: Secondary | ICD-10-CM | POA: Diagnosis not present

## 2019-05-09 DIAGNOSIS — N2581 Secondary hyperparathyroidism of renal origin: Secondary | ICD-10-CM | POA: Diagnosis not present

## 2019-05-09 DIAGNOSIS — N186 End stage renal disease: Secondary | ICD-10-CM | POA: Diagnosis not present

## 2019-05-09 DIAGNOSIS — D509 Iron deficiency anemia, unspecified: Secondary | ICD-10-CM | POA: Diagnosis not present

## 2019-05-09 NOTE — Telephone Encounter (Signed)
LMTCB to schedule appointment °

## 2019-05-09 NOTE — Telephone Encounter (Signed)
Per Dr. Loanne Drilling, unable to refill Repaglinide without an appt. Routing this message to the front desk for scheduling purposes.

## 2019-05-11 DIAGNOSIS — D631 Anemia in chronic kidney disease: Secondary | ICD-10-CM | POA: Diagnosis not present

## 2019-05-11 DIAGNOSIS — D509 Iron deficiency anemia, unspecified: Secondary | ICD-10-CM | POA: Diagnosis not present

## 2019-05-11 DIAGNOSIS — Z992 Dependence on renal dialysis: Secondary | ICD-10-CM | POA: Diagnosis not present

## 2019-05-11 DIAGNOSIS — N186 End stage renal disease: Secondary | ICD-10-CM | POA: Diagnosis not present

## 2019-05-11 DIAGNOSIS — N2581 Secondary hyperparathyroidism of renal origin: Secondary | ICD-10-CM | POA: Diagnosis not present

## 2019-05-13 DIAGNOSIS — N2581 Secondary hyperparathyroidism of renal origin: Secondary | ICD-10-CM | POA: Diagnosis not present

## 2019-05-13 DIAGNOSIS — D509 Iron deficiency anemia, unspecified: Secondary | ICD-10-CM | POA: Diagnosis not present

## 2019-05-13 DIAGNOSIS — N186 End stage renal disease: Secondary | ICD-10-CM | POA: Diagnosis not present

## 2019-05-13 DIAGNOSIS — Z992 Dependence on renal dialysis: Secondary | ICD-10-CM | POA: Diagnosis not present

## 2019-05-13 DIAGNOSIS — D631 Anemia in chronic kidney disease: Secondary | ICD-10-CM | POA: Diagnosis not present

## 2019-05-16 DIAGNOSIS — D631 Anemia in chronic kidney disease: Secondary | ICD-10-CM | POA: Diagnosis not present

## 2019-05-16 DIAGNOSIS — Z992 Dependence on renal dialysis: Secondary | ICD-10-CM | POA: Diagnosis not present

## 2019-05-16 DIAGNOSIS — D509 Iron deficiency anemia, unspecified: Secondary | ICD-10-CM | POA: Diagnosis not present

## 2019-05-16 DIAGNOSIS — N2581 Secondary hyperparathyroidism of renal origin: Secondary | ICD-10-CM | POA: Diagnosis not present

## 2019-05-16 DIAGNOSIS — N186 End stage renal disease: Secondary | ICD-10-CM | POA: Diagnosis not present

## 2019-05-18 DIAGNOSIS — Z992 Dependence on renal dialysis: Secondary | ICD-10-CM | POA: Diagnosis not present

## 2019-05-18 DIAGNOSIS — N2581 Secondary hyperparathyroidism of renal origin: Secondary | ICD-10-CM | POA: Diagnosis not present

## 2019-05-18 DIAGNOSIS — N186 End stage renal disease: Secondary | ICD-10-CM | POA: Diagnosis not present

## 2019-05-18 DIAGNOSIS — D631 Anemia in chronic kidney disease: Secondary | ICD-10-CM | POA: Diagnosis not present

## 2019-05-18 DIAGNOSIS — D509 Iron deficiency anemia, unspecified: Secondary | ICD-10-CM | POA: Diagnosis not present

## 2019-05-20 DIAGNOSIS — N186 End stage renal disease: Secondary | ICD-10-CM | POA: Diagnosis not present

## 2019-05-20 DIAGNOSIS — Z992 Dependence on renal dialysis: Secondary | ICD-10-CM | POA: Diagnosis not present

## 2019-05-20 DIAGNOSIS — D509 Iron deficiency anemia, unspecified: Secondary | ICD-10-CM | POA: Diagnosis not present

## 2019-05-20 DIAGNOSIS — D631 Anemia in chronic kidney disease: Secondary | ICD-10-CM | POA: Diagnosis not present

## 2019-05-20 DIAGNOSIS — N2581 Secondary hyperparathyroidism of renal origin: Secondary | ICD-10-CM | POA: Diagnosis not present

## 2019-05-23 DIAGNOSIS — D631 Anemia in chronic kidney disease: Secondary | ICD-10-CM | POA: Diagnosis not present

## 2019-05-23 DIAGNOSIS — N186 End stage renal disease: Secondary | ICD-10-CM | POA: Diagnosis not present

## 2019-05-23 DIAGNOSIS — Z992 Dependence on renal dialysis: Secondary | ICD-10-CM | POA: Diagnosis not present

## 2019-05-23 DIAGNOSIS — N2581 Secondary hyperparathyroidism of renal origin: Secondary | ICD-10-CM | POA: Diagnosis not present

## 2019-05-23 DIAGNOSIS — D509 Iron deficiency anemia, unspecified: Secondary | ICD-10-CM | POA: Diagnosis not present

## 2019-05-25 DIAGNOSIS — D509 Iron deficiency anemia, unspecified: Secondary | ICD-10-CM | POA: Diagnosis not present

## 2019-05-25 DIAGNOSIS — N186 End stage renal disease: Secondary | ICD-10-CM | POA: Diagnosis not present

## 2019-05-25 DIAGNOSIS — Z992 Dependence on renal dialysis: Secondary | ICD-10-CM | POA: Diagnosis not present

## 2019-05-25 DIAGNOSIS — D631 Anemia in chronic kidney disease: Secondary | ICD-10-CM | POA: Diagnosis not present

## 2019-05-25 DIAGNOSIS — N2581 Secondary hyperparathyroidism of renal origin: Secondary | ICD-10-CM | POA: Diagnosis not present

## 2019-05-27 ENCOUNTER — Ambulatory Visit: Payer: Medicare Other | Admitting: Internal Medicine

## 2019-05-27 DIAGNOSIS — Z992 Dependence on renal dialysis: Secondary | ICD-10-CM | POA: Diagnosis not present

## 2019-05-27 DIAGNOSIS — N186 End stage renal disease: Secondary | ICD-10-CM | POA: Diagnosis not present

## 2019-05-27 DIAGNOSIS — D509 Iron deficiency anemia, unspecified: Secondary | ICD-10-CM | POA: Diagnosis not present

## 2019-05-27 DIAGNOSIS — D631 Anemia in chronic kidney disease: Secondary | ICD-10-CM | POA: Diagnosis not present

## 2019-05-27 DIAGNOSIS — N2581 Secondary hyperparathyroidism of renal origin: Secondary | ICD-10-CM | POA: Diagnosis not present

## 2019-05-29 DIAGNOSIS — Z992 Dependence on renal dialysis: Secondary | ICD-10-CM | POA: Diagnosis not present

## 2019-05-29 DIAGNOSIS — T8610 Unspecified complication of kidney transplant: Secondary | ICD-10-CM | POA: Diagnosis not present

## 2019-05-29 DIAGNOSIS — N186 End stage renal disease: Secondary | ICD-10-CM | POA: Diagnosis not present

## 2019-05-30 DIAGNOSIS — N186 End stage renal disease: Secondary | ICD-10-CM | POA: Diagnosis not present

## 2019-05-30 DIAGNOSIS — D631 Anemia in chronic kidney disease: Secondary | ICD-10-CM | POA: Diagnosis not present

## 2019-05-30 DIAGNOSIS — Z992 Dependence on renal dialysis: Secondary | ICD-10-CM | POA: Diagnosis not present

## 2019-05-30 DIAGNOSIS — N2581 Secondary hyperparathyroidism of renal origin: Secondary | ICD-10-CM | POA: Diagnosis not present

## 2019-06-01 DIAGNOSIS — N186 End stage renal disease: Secondary | ICD-10-CM | POA: Diagnosis not present

## 2019-06-01 DIAGNOSIS — N2581 Secondary hyperparathyroidism of renal origin: Secondary | ICD-10-CM | POA: Diagnosis not present

## 2019-06-01 DIAGNOSIS — Z992 Dependence on renal dialysis: Secondary | ICD-10-CM | POA: Diagnosis not present

## 2019-06-01 DIAGNOSIS — D631 Anemia in chronic kidney disease: Secondary | ICD-10-CM | POA: Diagnosis not present

## 2019-06-03 ENCOUNTER — Other Ambulatory Visit: Payer: Self-pay

## 2019-06-03 DIAGNOSIS — D631 Anemia in chronic kidney disease: Secondary | ICD-10-CM | POA: Diagnosis not present

## 2019-06-03 DIAGNOSIS — N2581 Secondary hyperparathyroidism of renal origin: Secondary | ICD-10-CM | POA: Diagnosis not present

## 2019-06-03 DIAGNOSIS — Z79899 Other long term (current) drug therapy: Secondary | ICD-10-CM | POA: Diagnosis not present

## 2019-06-03 DIAGNOSIS — Z125 Encounter for screening for malignant neoplasm of prostate: Secondary | ICD-10-CM | POA: Diagnosis not present

## 2019-06-03 DIAGNOSIS — Z129 Encounter for screening for malignant neoplasm, site unspecified: Secondary | ICD-10-CM | POA: Diagnosis not present

## 2019-06-03 DIAGNOSIS — Z1159 Encounter for screening for other viral diseases: Secondary | ICD-10-CM | POA: Diagnosis not present

## 2019-06-03 DIAGNOSIS — N186 End stage renal disease: Secondary | ICD-10-CM | POA: Diagnosis not present

## 2019-06-03 DIAGNOSIS — Z992 Dependence on renal dialysis: Secondary | ICD-10-CM | POA: Diagnosis not present

## 2019-06-03 DIAGNOSIS — M109 Gout, unspecified: Secondary | ICD-10-CM | POA: Diagnosis not present

## 2019-06-06 DIAGNOSIS — N186 End stage renal disease: Secondary | ICD-10-CM | POA: Diagnosis not present

## 2019-06-06 DIAGNOSIS — D631 Anemia in chronic kidney disease: Secondary | ICD-10-CM | POA: Diagnosis not present

## 2019-06-06 DIAGNOSIS — Z992 Dependence on renal dialysis: Secondary | ICD-10-CM | POA: Diagnosis not present

## 2019-06-06 DIAGNOSIS — N2581 Secondary hyperparathyroidism of renal origin: Secondary | ICD-10-CM | POA: Diagnosis not present

## 2019-06-07 ENCOUNTER — Other Ambulatory Visit: Payer: Self-pay

## 2019-06-07 ENCOUNTER — Encounter: Payer: Self-pay | Admitting: Endocrinology

## 2019-06-07 ENCOUNTER — Ambulatory Visit (INDEPENDENT_AMBULATORY_CARE_PROVIDER_SITE_OTHER): Payer: Medicare Other | Admitting: Endocrinology

## 2019-06-07 VITALS — BP 100/58 | HR 64 | Ht 66.5 in | Wt 219.8 lb

## 2019-06-07 DIAGNOSIS — Z992 Dependence on renal dialysis: Secondary | ICD-10-CM

## 2019-06-07 DIAGNOSIS — E1122 Type 2 diabetes mellitus with diabetic chronic kidney disease: Secondary | ICD-10-CM

## 2019-06-07 DIAGNOSIS — E118 Type 2 diabetes mellitus with unspecified complications: Secondary | ICD-10-CM | POA: Diagnosis not present

## 2019-06-07 DIAGNOSIS — N186 End stage renal disease: Secondary | ICD-10-CM | POA: Diagnosis not present

## 2019-06-07 DIAGNOSIS — I200 Unstable angina: Secondary | ICD-10-CM

## 2019-06-07 LAB — POCT GLYCOSYLATED HEMOGLOBIN (HGB A1C): Hemoglobin A1C: 6.4 % — AB (ref 4.0–5.6)

## 2019-06-07 MED ORDER — CEPHALEXIN 500 MG PO CAPS: 500.0000 mg | ORAL_CAPSULE | Freq: Two times a day (BID) | ORAL | 0 refills | Status: DC

## 2019-06-07 NOTE — Patient Instructions (Addendum)
Please continue the same medication. check your blood sugar once a day.  vary the time of day when you check, between before the 3 meals, and at bedtime.  also check if you have symptoms of your blood sugar being too high or too low.  please keep a record of the readings and bring it to your next appointment here (or you can bring the meter itself).  You can write it on any piece of paper.  please call us sooner if your blood sugar goes below 70, or if you have a lot of readings over 200. I have sent a prescription to your pharmacy, for an antibiotic pill.  Please come back for a follow-up appointment in 4-6 months.

## 2019-06-07 NOTE — Progress Notes (Signed)
Subjective:    Patient ID: Patrick [name], male    DOB: [birth year], 63 y.o.   MRN: [id number]  HPI Pt returns for f/u of diabetes mellitus: DM type: 2 Dx'ed: 2014, when he presented with severe hyperglycemia.   Complications: polyneuropathy, CAD, PAD, and renal failure.  Therapy: repaglinide.  DKA: never. Severe hypoglycemia: never. Pancreatitis: never. Other: he took insulin 2014-2016; he had renal transplant in 2009, but renal failure has recurred.  He is back on dialysis.  Fructosamine has suggested a1c approx 1% higher than a1c itself.  Interval history: He has not recently checked cbg.  pt states he feels well in general.  He takes meds as rx'ed.  Past Medical History:  Diagnosis Date  . Arthritis   . Atrial flutter ([place])    Ablation 2018  . 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 Erectile dysfunction   . ESRD (end stage renal disease) on dialysis ([place])    Pt on HD 2003 >> transplanted in 2009, back on HD in 2016. [name].   . Essential hypertension   . Gout   . Hernia of abdominal wall   . History of blood transfusion   . History of cardiomyopathy   . History of kidney stones   . History of pneumonia   . Migraine   . Myocardial infarction ([place])    1996  . Pneumonia   . Secondary hyperparathyroidism ([place])   . Type 2 diabetes mellitus ([place])   . Wears glasses     Past Surgical History:  Procedure Laterality Date  . A-FLUTTER ABLATION N/A 09/24/2016   Procedure: A-Flutter Ablation;  Surgeon: Will Meredith Leeds, MD;  Location: [facility];  Service: Cardiovascular;  Laterality: N/A;  . ABDOMINAL AORTOGRAM W/LOWER EXTREMITY N/A 04/13/2017   Procedure: ABDOMINAL AORTOGRAM W/LOWER EXTREMITY;  Surgeon: Conrad Bluebell, MD;  Location: [facility];  Service: Cardiovascular;  Laterality:  N/A;  Bilater lower extermity  . APPENDECTOMY    . AV FISTULA PLACEMENT Right 09/18/2014   Procedure: INSERTION OF ARTERIOVENOUS (AV) GORE-TEX GRAFT ARM USING 4-7MM  X 45CM STRETCH GORE-TEX VASCULAR GRAFT;  Surgeon: Rosetta Posner, MD;  Location: [facility];  Service: Vascular;  Laterality: Right;  . AV FISTULA PLACEMENT Left 07/07/2016   Procedure: INSERTION OF LEFT BRACHIAL TO AXILLARY ARTERIOVENOUS (AV) GORE-TEX ARM GRAFT;  Surgeon: Conrad Windom, MD;  Location: [facility];  Service: Vascular;  Laterality: Left;  . AV FISTULA PLACEMENT Left 12/09/2018   Procedure: Insertion Of Arteriovenous (Av) Gore-Tex Graft Arm, left arm;  Surgeon: Marty Heck, MD;  Location: [facility];  Service: Vascular;  Laterality: Left;  . CARDIAC CATHETERIZATION  ~ 2016  . COLONOSCOPY    . 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 CORONARY ATHERECTOMY N/A 02/16/2019   Procedure: CORONARY ATHERECTOMY;  Surgeon: Martinique, Peter M, MD;  Location: [facility];  Service: Cardiovascular;  Laterality: N/A;  . EXCHANGE OF A DIALYSIS CATHETER Left 01/11/2018   Procedure: EXCHANGE OF TUNNELED DIALYSIS CATHETER;  Surgeon: Rosetta Posner, MD;  Location: [facility];  Service: Vascular;  Laterality: Left;  . HERNIA REPAIR  2017   with nephrectomy  . INSERTION OF DIALYSIS CATHETER N/A 10/08/2017   Procedure: INSERTION  OF TUNNELED DIALYSIS CATHETER;  Surgeon: Conrad Sugarcreek, MD;  Location: [facility];  Service: Vascular;  Laterality: N/A;  . IR FLUORO GUIDE CV LINE LEFT  03/12/2018  . IR GENERIC HISTORICAL  05/11/2016   IR FLUORO GUIDE CV LINE LEFT 05/11/2016 Corrie Mckusick, DO [facility]  . IR GENERIC HISTORICAL  05/11/2016   IR US GUIDE VASC ACCESS LEFT 05/11/2016 Corrie Mckusick, DO [facility]  . IR GENERIC HISTORICAL  05/11/2016   IR US GUIDE VASC ACCESS RIGHT 05/11/2016 Corrie Mckusick, DO [facility]  . IR GENERIC HISTORICAL  05/11/2016   IR RADIOLOGY PERIPHERAL  GUIDED IV START 05/11/2016 Corrie Mckusick, DO [facility]  . KIDNEY TRANSPLANT  2009  . LEFT HEART CATH AND CORONARY ANGIOGRAPHY N/A 02/15/2019   Procedure: LEFT HEART CATH AND CORONARY ANGIOGRAPHY;  Surgeon: Martinique, Peter M, MD;  Location: [facility];  Service: Cardiovascular;  Laterality: N/A;  . LEFT HEART CATH AND CORS/GRAFTS ANGIOGRAPHY N/A 12/04/2017   Procedure: LEFT HEART CATH AND CORS/GRAFTS ANGIOGRAPHY;  Surgeon: Belva Crome, MD;  Location: [facility];  Service: Cardiovascular;  Laterality: N/A;  . NEPHRECTOMY  2017   transplant rejected   . PERIPHERAL VASCULAR CATHETERIZATION N/A 06/04/2016   Procedure: Upper Extremity Venography;  Surgeon: Waynetta Sandy, MD;  Location: [facility];  Service: Cardiovascular;  Laterality: N/A;  . PERIPHERAL VASCULAR INTERVENTION  04/13/2017   Procedure: PERIPHERAL VASCULAR INTERVENTION;  Surgeon: Conrad Oak Hill, MD;  Location: [facility];  Service: Cardiovascular;;  Lt. Common/Exernal  Iliac  . THROMBECTOMY AND REVISION OF ARTERIOVENTOUS (AV) GORETEX  GRAFT Left 10/08/2017   Procedure: THROMBECTOMY of ARTERIOVENTOUS (AV) GORETEX  GRAFT LEFT UPPER ARM;  Surgeon: Conrad Bronx, MD;  Location: [facility];  Service: Vascular;  Laterality: Left;  . THROMBECTOMY W/ EMBOLECTOMY Left 09/14/2017   Procedure: THROMBECTOMY ARTERIOVENOUS GORE-TEX GRAFT LEFT UPPER ARM;  Surgeon: Rosetta Posner, MD;  Location: [facility];  Service: Vascular;  Laterality: Left;  . UPPER EXTREMITY ANGIOGRAPHY Bilateral 10/29/2018   Procedure: UPPER EXTREMITY ANGIOGRAPHY;  Surgeon: Marty Heck, MD;  Location: [facility];  Service: Cardiovascular;  Laterality: Bilateral;  . UPPER EXTREMITY VENOGRAPHY N/A 11/16/2017   Procedure: UPPER EXTREMITY VENOGRAPHY - Right Arm;  Surgeon: Conrad Sunnyslope, MD;  Location: [facility];  Service: Cardiovascular;  Laterality: N/A;  . UPPER EXTREMITY VENOGRAPHY Bilateral 10/29/2018   Procedure: UPPER EXTREMITY  VENOGRAPHY;  Surgeon: Marty Heck, MD;  Location: [facility];  Service: Cardiovascular;  Laterality: Bilateral;    Social History   Socioeconomic History  . Marital status: Married    Spouse name: SHEILA  . Number of children: 5  . Years of education: 54  . Highest education level: 12th grade  Occupational History  . Occupation: DISABLED  Social Needs  . Financial resource strain: Not on file  . Food insecurity    Worry: Never true    Inability: Never true  . Transportation needs    Medical: No    Non-medical: No  Tobacco Use  . Smoking status: Former Smoker    Types: Cigarettes    Quit date: 07/04/2014    Years since quitting: 4.9  . Smokeless tobacco: Never Used  . Tobacco comment: "smoked ~ 1 pack/month when I did smoke; never a steady smoker"  Substance and Sexual Activity  . Alcohol use: Yes    Alcohol/week: 0.0 standard drinks    Comment: rare  "  2 drinks, 1-2 times/year"  . Drug use: No  . Sexual activity: Not Currently    Birth control/protection: None  Lifestyle  . Physical activity    Days per week: 0 days    Minutes per session: 0 min  . Stress: Not at all  Relationships  . Social connections    Talks on phone: More than three times a week    Gets together: More than three times a week    Attends religious service: Never    Active member of club or organization: No    Attends meetings of clubs or organizations: Never    Relationship status: Married  . Intimate partner violence    Fear of current or ex partner: No    Emotionally abused: No    Physically abused: No    Forced sexual activity: No  Other Topics Concern  . Not on file  Social History Narrative  . Not on file    Current Outpatient Medications on File Prior to Visit  Medication Sig Dispense Refill  . acetaminophen (TYLENOL) 500 MG tablet Take 1,000 mg by mouth every 6 (six) hours as needed for moderate pain or headache.    . albuterol (PROVENTIL HFA;VENTOLIN HFA) 108 (90  Base) MCG/ACT inhaler Inhale 1-2 puffs into the lungs every 6 (six) hours as needed for wheezing or shortness of breath. 1 Inhaler 0  . aspirin EC 81 MG tablet Take 81 mg by mouth [name].     . calcium acetate (PHOSLO) 667 MG capsule Take 2,668 mg by mouth [name] with supper.     . Calcium Carbonate-Vitamin D (CALCIUM 500/D PO) Take 3 tablets by mouth [name].     . clopidogrel (PLAVIX) 75 MG tablet TAKE 1 TABLET(75 MG) BY MOUTH [name] 90 tablet 3  . Colchicine 0.6 MG CAPS Take 0.6 mg by mouth See admin instructions. Take 0.6 mg when first symptoms of gout flare occur, then take another 0.6 mg 14 days later as needed for gout    . dextromethorphan (DELSYM) 30 MG/5ML liquid Take 30 mg by mouth as needed for cough.    . docusate sodium (COLACE) 100 MG capsule Take 100 mg by mouth [name] as needed for mild constipation.    Marland Kitchen ezetimibe (ZETIA) 10 MG tablet TAKE 1 TABLET(10 MG) BY MOUTH [name] 90 tablet 3  . isosorbide mononitrate (IMDUR) 30 MG 24 hr tablet Take 1 tablet (30 mg total) by mouth [name]. 90 tablet 3  . metoprolol succinate (TOPROL XL) 25 MG 24 hr tablet Take 1 tablet (25 mg total) by mouth [name]. 90 tablet 3  . multivitamin (RENA-VIT) TABS tablet Take 1 tablet by mouth [name].     Marland Kitchen oxyCODONE-acetaminophen (PERCOCET/ROXICET) 5-325 MG tablet Take 1 tablet by mouth every 6 (six) hours as needed. 12 tablet 0  . pantoprazole (PROTONIX) 40 MG tablet Take 1 tablet (40 mg total) by mouth [name]. 90 tablet 3  . repaglinide (PRANDIN) 0.5 MG tablet Take 1 tablet (0.5 mg total) by mouth 2 (two) times [name] before a meal. 180 tablet 3  . rosuvastatin (CRESTOR) 10 MG tablet Take 1 tablet (10 mg total) by mouth [name]. 90 tablet 3  . nitroGLYCERIN (NITROSTAT) 0.4 MG SL tablet Place 1 tablet (0.4 mg total) under the tongue every 5 (five) minutes as needed for chest pain. 25 tablet 5   No current facility-administered medications on file prior to visit.     Allergies  Allergen Reactions  . Baclofen Other (See  Comments)  Possibly stroke like symptoms  . Iodinated Diagnostic Agents Swelling, Rash and Other (See Comments)    Other Reaction: burning to mouth, swelling of lips  . Lipitor [Atorvastatin] Other (See Comments)    Leg pain  . Metoprolol Other (See Comments)    Headaches     Family History  Problem Relation Age of Onset  . Hyperlipidemia Mother   . Hypertension Mother   . HIV Sister     BP (!) 100/58 (BP Location: Left Arm, Patient Position: Sitting, Cuff Size: Large)   Pulse 64   Ht 5' 6.5" (1.689 m)   Wt 219 lb 12.8 oz (99.7 kg)   BMI 34.95 kg/m    Review of Systems He denies hypoglycemia    Objective:   Physical Exam VITAL SIGNS:  See vs page GENERAL: no distress Pulses: dorsalis pedis intact bilat.   MSK: no deformity of the feet CV: no leg edema Skin:  no ulcer on the feet.  normal color and temp on the feet. Neuro: sensation is intact to touch on the feet Ext: both great toenails are ingrown.  There is erythema at the left distal toe, but no tend/drainage/swelling.    Lab Results  Component Value Date   HGBA1C 6.4 (A) 06/07/2019       Assessment & Plan:  Type 2 DM, with PAD: well-controlled Abnormality of glucose-hemoglobin interaction, due to renal failure.  Fructosamine is a more reliable indicator of glycemic control. Poss cellulitis of the toe, new.    Patient Instructions  Please continue the same medication. check your blood sugar once a day.  vary the time of day when you check, between before the 3 meals, and at bedtime.  also check if you have symptoms of your blood sugar being too high or too low.  please keep a record of the readings and bring it to your next appointment here (or you can bring the meter itself).  You can write it on any piece of paper.  please call us sooner if your blood sugar goes below 70, or if you have a lot of readings over 200. I have sent a prescription to your pharmacy, for an antibiotic pill.  Please come back for a  follow-up appointment in 4-6 months.

## 2019-06-08 DIAGNOSIS — Z992 Dependence on renal dialysis: Secondary | ICD-10-CM | POA: Diagnosis not present

## 2019-06-08 DIAGNOSIS — N186 End stage renal disease: Secondary | ICD-10-CM | POA: Diagnosis not present

## 2019-06-08 DIAGNOSIS — D631 Anemia in chronic kidney disease: Secondary | ICD-10-CM | POA: Diagnosis not present

## 2019-06-08 DIAGNOSIS — N2581 Secondary hyperparathyroidism of renal origin: Secondary | ICD-10-CM | POA: Diagnosis not present

## 2019-06-10 DIAGNOSIS — Z992 Dependence on renal dialysis: Secondary | ICD-10-CM | POA: Diagnosis not present

## 2019-06-10 DIAGNOSIS — N2581 Secondary hyperparathyroidism of renal origin: Secondary | ICD-10-CM | POA: Diagnosis not present

## 2019-06-10 DIAGNOSIS — N186 End stage renal disease: Secondary | ICD-10-CM | POA: Diagnosis not present

## 2019-06-10 DIAGNOSIS — D631 Anemia in chronic kidney disease: Secondary | ICD-10-CM | POA: Diagnosis not present

## 2019-06-13 DIAGNOSIS — N2581 Secondary hyperparathyroidism of renal origin: Secondary | ICD-10-CM | POA: Diagnosis not present

## 2019-06-13 DIAGNOSIS — Z992 Dependence on renal dialysis: Secondary | ICD-10-CM | POA: Diagnosis not present

## 2019-06-13 DIAGNOSIS — D631 Anemia in chronic kidney disease: Secondary | ICD-10-CM | POA: Diagnosis not present

## 2019-06-13 DIAGNOSIS — N186 End stage renal disease: Secondary | ICD-10-CM | POA: Diagnosis not present

## 2019-06-15 DIAGNOSIS — D631 Anemia in chronic kidney disease: Secondary | ICD-10-CM | POA: Diagnosis not present

## 2019-06-15 DIAGNOSIS — Z992 Dependence on renal dialysis: Secondary | ICD-10-CM | POA: Diagnosis not present

## 2019-06-15 DIAGNOSIS — N186 End stage renal disease: Secondary | ICD-10-CM | POA: Diagnosis not present

## 2019-06-15 DIAGNOSIS — N2581 Secondary hyperparathyroidism of renal origin: Secondary | ICD-10-CM | POA: Diagnosis not present

## 2019-06-19 DIAGNOSIS — N2581 Secondary hyperparathyroidism of renal origin: Secondary | ICD-10-CM | POA: Diagnosis not present

## 2019-06-19 DIAGNOSIS — D631 Anemia in chronic kidney disease: Secondary | ICD-10-CM | POA: Diagnosis not present

## 2019-06-19 DIAGNOSIS — Z992 Dependence on renal dialysis: Secondary | ICD-10-CM | POA: Diagnosis not present

## 2019-06-19 DIAGNOSIS — N186 End stage renal disease: Secondary | ICD-10-CM | POA: Diagnosis not present

## 2019-06-21 DIAGNOSIS — N186 End stage renal disease: Secondary | ICD-10-CM | POA: Diagnosis not present

## 2019-06-21 DIAGNOSIS — Z992 Dependence on renal dialysis: Secondary | ICD-10-CM | POA: Diagnosis not present

## 2019-06-21 DIAGNOSIS — D631 Anemia in chronic kidney disease: Secondary | ICD-10-CM | POA: Diagnosis not present

## 2019-06-21 DIAGNOSIS — N2581 Secondary hyperparathyroidism of renal origin: Secondary | ICD-10-CM | POA: Diagnosis not present

## 2019-06-24 DIAGNOSIS — N186 End stage renal disease: Secondary | ICD-10-CM | POA: Diagnosis not present

## 2019-06-24 DIAGNOSIS — Z992 Dependence on renal dialysis: Secondary | ICD-10-CM | POA: Diagnosis not present

## 2019-06-24 DIAGNOSIS — D631 Anemia in chronic kidney disease: Secondary | ICD-10-CM | POA: Diagnosis not present

## 2019-06-24 DIAGNOSIS — N2581 Secondary hyperparathyroidism of renal origin: Secondary | ICD-10-CM | POA: Diagnosis not present

## 2019-06-27 DIAGNOSIS — N186 End stage renal disease: Secondary | ICD-10-CM | POA: Diagnosis not present

## 2019-06-27 DIAGNOSIS — D631 Anemia in chronic kidney disease: Secondary | ICD-10-CM | POA: Diagnosis not present

## 2019-06-27 DIAGNOSIS — Z992 Dependence on renal dialysis: Secondary | ICD-10-CM | POA: Diagnosis not present

## 2019-06-27 DIAGNOSIS — N2581 Secondary hyperparathyroidism of renal origin: Secondary | ICD-10-CM | POA: Diagnosis not present

## 2019-07-02 NOTE — Progress Notes (Signed)
Cardiology Office Note   Date:  07/04/2019   ID:  Patrick [name], DOB 03/19/1956, MRN [id number]  PCP:  Nolene Ebbs, MD  Cardiologist:   Dorris Carnes, MD   Follow-up of CAD.    History of Present Illness: Patrick [name] is a 63 y.o. male with a history ofsignificant for CAD (anomalous LM off RCA) CABG times 1 1997, chronic combined CHF, atrial flutter s/p ablation 2018(discontinued Coumadin),ESRD on HD after failed kidney transplant, hypertension, hyperlipidemia, diabetes.  Patient had cardiac catheterization in May/2018 for angina with finding of atretic LIMA-LAD and occluded LAD with left to left and left to right collaterals. He had moderate nonobstructive disease elsewhere. It was felt that his angina was related to his occluded LAD. Discussion of CTO was undertaken but would be very difficult so medical therapy was elected.   Patient was admitted in 10/2018 with weakness and shortness of breath, found to be hypokalemic with potassium of 2.8. Troponin was slightly elevated at 1.13,felt to be in the setting of renal insufficiency. He was recommended for cardiac catheterization but declined at the time. He continued on aspirin, Plavix, beta-blocker, statin and amlodipine. Imdur 30 mg was added.  The patient presented to the ED on 02/14/2019 with recurrent angina, progressively worsening over 4 weeks until occurring at rest. High-sensitivity troponin was 289,follow-up 175. No ischemic changes on EKG. He was taken for left heart catheterization on 02/15/2019 which noted chronic total occlusion of the mid LAD and high-grade obstructive disease in the mid RCA. He was brought back to the Cath Lab on 02/16/2019 successful PCI to the mid to distal RCA using orbital atherectomy and stenting with overlapping DES x2. Recommendation is for dual antiplatelet therapy for at least 1 year. Blood pressure was low at times and noted will need to be monitored.  Last seen by Buren Kos in Oct  2020  With symptms added low dose b blcker     The patient continues to have intermittent chest pains.  He said he felt good for about a month after the intervention in July and then he started having chest pain again.  He takes nitroglycerin a few times per week.  No prolonged spells.  Not sexually active because of discomfort. He denies dizziness even though his blood pressure is low at times.  He says dialysis times are okay they are able to get fluid off.  He denies dizziness even though his blood pressure is low at times     Current Meds  Medication Sig  . acetaminophen (TYLENOL) 500 MG tablet Take 1,000 mg by mouth every 6 (six) hours as needed for moderate pain or headache.  . albuterol (PROVENTIL HFA;VENTOLIN HFA) 108 (90 Base) MCG/ACT inhaler Inhale 1-2 puffs into the lungs every 6 (six) hours as needed for wheezing or shortness of breath.  Marland Kitchen aspirin EC 81 MG tablet Take 81 mg by mouth daily.   . calcium acetate (PHOSLO) 667 MG capsule Take 2,668 mg by mouth daily with supper.   . Calcium Carbonate-Vitamin D (CALCIUM 500/D PO) Take 3 tablets by mouth daily.   . cephALEXin (KEFLEX) 500 MG capsule Take 1 capsule (500 mg total) by mouth 2 (two) times daily.  . clopidogrel (PLAVIX) 75 MG tablet TAKE 1 TABLET(75 MG) BY MOUTH DAILY  . Colchicine 0.6 MG CAPS Take 0.6 mg by mouth See admin instructions. Take 0.6 mg when first symptoms of gout flare occur, then take another 0.6 mg 14 days later as needed for gout  .  dextromethorphan (DELSYM) 30 MG/5ML liquid Take 30 mg by mouth as needed for cough.  . docusate sodium (COLACE) 100 MG capsule Take 100 mg by mouth daily as needed for mild constipation.  Marland Kitchen ezetimibe (ZETIA) 10 MG tablet TAKE 1 TABLET(10 MG) BY MOUTH DAILY  . isosorbide mononitrate (IMDUR) 30 MG 24 hr tablet Take 1 tablet (30 mg total) by mouth daily.  . metoprolol succinate (TOPROL XL) 25 MG 24 hr tablet Take 1 tablet (25 mg total) by mouth daily.  . multivitamin (RENA-VIT) TABS  tablet Take 1 tablet by mouth daily.   . nitroGLYCERIN (NITROSTAT) 0.4 MG SL tablet Place 1 tablet (0.4 mg total) under the tongue every 5 (five) minutes as needed for chest pain.  Marland Kitchen oxyCODONE-acetaminophen (PERCOCET/ROXICET) 5-325 MG tablet Take 1 tablet by mouth every 6 (six) hours as needed.  . pantoprazole (PROTONIX) 40 MG tablet Take 1 tablet (40 mg total) by mouth daily.  . repaglinide (PRANDIN) 0.5 MG tablet Take 1 tablet (0.5 mg total) by mouth 2 (two) times daily before a meal.  . rosuvastatin (CRESTOR) 10 MG tablet Take 1 tablet (10 mg total) by mouth daily.     Allergies:   Baclofen, Iodinated diagnostic agents, Lipitor [atorvastatin], and Metoprolol   Past Medical History:  Diagnosis Date  . Arthritis   . Atrial flutter ([place])    Ablation 2018  . 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 Erectile dysfunction   . ESRD (end stage renal disease) on dialysis ([place])    Pt on HD 2003 >> transplanted in 2009, back on HD in 2016. [name].   . Essential hypertension   . Gout   . Hernia of abdominal wall   . History of blood transfusion   . History of cardiomyopathy   . History of kidney stones   . History of pneumonia   . Migraine   . Myocardial infarction ([place])    1996  . Pneumonia   . Secondary hyperparathyroidism ([place])   . Type 2 diabetes mellitus ([place])   . Wears glasses     Past Surgical History:  Procedure Laterality Date  . A-FLUTTER ABLATION N/A 09/24/2016   Procedure: A-Flutter Ablation;  Surgeon: Will Meredith Leeds, MD;  Location: [facility];  Service: Cardiovascular;  Laterality: N/A;  . ABDOMINAL AORTOGRAM W/LOWER EXTREMITY N/A 04/13/2017   Procedure: ABDOMINAL AORTOGRAM W/LOWER EXTREMITY;  Surgeon: Conrad Ozaukee, MD;  Location: [facility];  Service: Cardiovascular;   Laterality: N/A;  Bilater lower extermity  . APPENDECTOMY    . AV FISTULA PLACEMENT Right 09/18/2014   Procedure: INSERTION OF ARTERIOVENOUS (AV) GORE-TEX GRAFT ARM USING 4-7MM  X 45CM STRETCH GORE-TEX VASCULAR GRAFT;  Surgeon: Rosetta Posner, MD;  Location: [facility];  Service: Vascular;  Laterality: Right;  . AV FISTULA PLACEMENT Left 07/07/2016   Procedure: INSERTION OF LEFT BRACHIAL TO AXILLARY ARTERIOVENOUS (AV) GORE-TEX ARM GRAFT;  Surgeon: Conrad Depauville, MD;  Location: [facility];  Service: Vascular;  Laterality: Left;  . AV FISTULA PLACEMENT Left 12/09/2018   Procedure: Insertion Of Arteriovenous (Av) Gore-Tex Graft Arm, left arm;  Surgeon: Marty Heck, MD;  Location: [facility];  Service: Vascular;  Laterality: Left;  . CARDIAC CATHETERIZATION  ~ 2016  . COLONOSCOPY    . [place]  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./notes 09/04/2005  . CORONARY ATHERECTOMY N/A 02/16/2019   Procedure: CORONARY ATHERECTOMY;  Surgeon: Martinique, Peter M, MD;  Location: [facility];  Service: Cardiovascular;  Laterality: N/A;  . EXCHANGE OF A DIALYSIS CATHETER Left 01/11/2018   Procedure: EXCHANGE OF TUNNELED DIALYSIS CATHETER;  Surgeon: Rosetta Posner, MD;  Location: [facility];  Service: Vascular;  Laterality: Left;  . HERNIA REPAIR  2017   with nephrectomy  . INSERTION OF DIALYSIS CATHETER N/A 10/08/2017   Procedure: INSERTION OF TUNNELED DIALYSIS CATHETER;  Surgeon: Conrad Dike, MD;  Location: [facility];  Service: Vascular;  Laterality: N/A;  . IR FLUORO GUIDE CV LINE LEFT  03/12/2018  . IR GENERIC HISTORICAL  05/11/2016   IR FLUORO GUIDE CV LINE LEFT 05/11/2016 Corrie Mckusick, DO [facility]  . IR GENERIC HISTORICAL  05/11/2016   IR US GUIDE VASC ACCESS LEFT 05/11/2016 Corrie Mckusick, DO [facility]  . IR GENERIC HISTORICAL  05/11/2016   IR US GUIDE VASC ACCESS RIGHT 05/11/2016 Corrie Mckusick, DO [facility]  . IR GENERIC HISTORICAL  05/11/2016   IR RADIOLOGY  PERIPHERAL GUIDED IV START 05/11/2016 Corrie Mckusick, DO [facility]  . KIDNEY TRANSPLANT  2009  . LEFT HEART CATH AND CORONARY ANGIOGRAPHY N/A 02/15/2019   Procedure: LEFT HEART CATH AND CORONARY ANGIOGRAPHY;  Surgeon: Martinique, Peter M, MD;  Location: [facility];  Service: Cardiovascular;  Laterality: N/A;  . LEFT HEART CATH AND CORS/GRAFTS ANGIOGRAPHY N/A 12/04/2017   Procedure: LEFT HEART CATH AND CORS/GRAFTS ANGIOGRAPHY;  Surgeon: Belva Crome, MD;  Location: [facility];  Service: Cardiovascular;  Laterality: N/A;  . NEPHRECTOMY  2017   transplant rejected   . PERIPHERAL VASCULAR CATHETERIZATION N/A 06/04/2016   Procedure: Upper Extremity Venography;  Surgeon: Waynetta Sandy, MD;  Location: [facility];  Service: Cardiovascular;  Laterality: N/A;  . PERIPHERAL VASCULAR INTERVENTION  04/13/2017   Procedure: PERIPHERAL VASCULAR INTERVENTION;  Surgeon: Conrad Laguna Beach, MD;  Location: [facility];  Service: Cardiovascular;;  Lt. Common/Exernal  Iliac  . THROMBECTOMY AND REVISION OF ARTERIOVENTOUS (AV) GORETEX  GRAFT Left 10/08/2017   Procedure: THROMBECTOMY of ARTERIOVENTOUS (AV) GORETEX  GRAFT LEFT UPPER ARM;  Surgeon: Conrad Berrysburg, MD;  Location: [facility];  Service: Vascular;  Laterality: Left;  . THROMBECTOMY W/ EMBOLECTOMY Left 09/14/2017   Procedure: THROMBECTOMY ARTERIOVENOUS GORE-TEX GRAFT LEFT UPPER ARM;  Surgeon: Rosetta Posner, MD;  Location: [facility];  Service: Vascular;  Laterality: Left;  . UPPER EXTREMITY ANGIOGRAPHY Bilateral 10/29/2018   Procedure: UPPER EXTREMITY ANGIOGRAPHY;  Surgeon: Marty Heck, MD;  Location: [facility];  Service: Cardiovascular;  Laterality: Bilateral;  . UPPER EXTREMITY VENOGRAPHY N/A 11/16/2017   Procedure: UPPER EXTREMITY VENOGRAPHY - Right Arm;  Surgeon: Conrad Enterprise, MD;  Location: [facility];  Service: Cardiovascular;  Laterality: N/A;  . UPPER EXTREMITY VENOGRAPHY Bilateral 10/29/2018   Procedure: UPPER  EXTREMITY VENOGRAPHY;  Surgeon: Marty Heck, MD;  Location: [facility];  Service: Cardiovascular;  Laterality: Bilateral;     Social History:  The patient  reports that he quit smoking about 5 years ago. His smoking use included cigarettes. He has never used smokeless tobacco. He reports current alcohol use. He reports that he does not use drugs.   Family History:  The patient's family history includes HIV in his sister; Hyperlipidemia in his mother; Hypertension in his mother.  ROS:  Please see the history of present illness. All other systems are reviewed and  Negative to the above problem except as noted.    PHYSICAL EXAM: VS:  BP (!) 77/48   Pulse (!) 46   Ht 5' 6.5" (1.689 m)   Wt 219 lb (99.3 kg)   SpO2 90%   BMI 34.82 kg/m   GEN: Well nourished, well developed, in no acute distress  HEENT: normal  Neck: no JVD, carotid bruits, Cardiac: RRR; no murmurs, rubs,no edema  Respiratory:  clear to auscultation bilaterally, normal work of breathing GI: soft, nontender, nondistended, + BS  No hepatomegaly  MS: no deformity Moving all extremities   Skin: warm and dry, no rash Neuro:  Strength and sensation are intact Psych: euthymic mood, full affect   EKG:  EKG is not ordered today.   Lipid Panel    Component Value Date/Time   CHOL 122 02/15/2019 0049   CHOL 176 07/02/2018 0735   TRIG 85 02/15/2019 0049   HDL 46 02/15/2019 0049   HDL 39 (L) 07/02/2018 0735   CHOLHDL 2.7 02/15/2019 0049   VLDL 17 02/15/2019 0049   LDLCALC 59 02/15/2019 0049   LDLCALC 89 07/02/2018 0735      Wt Readings from Last 3 Encounters:  07/04/19 219 lb (99.3 kg)  06/07/19 219 lb 12.8 oz (99.7 kg)  05/05/19 218 lb 1.9 oz (98.9 kg)      ASSESSMENT AND PLAN:  1  CAD concerning that the patient symptoms have recurred.  He has chronic total occlusion of the LAD.  I am not convinced that his RCA stent is gone bad.  He probably has some symptoms because of his low blood  pressure.  (Perfusion pressure down).  He is not on midodrine, said his blood pressure dropped on this (?).  I will review his case with Dr. Martinique review the films.  For now keep on medical therapy.  2  HL the patient remains on Crestor.  3  Blood pressure patient is no longer on midodrine.  Blood pressure low.  He denies dizziness.  4   ESRD continue with dialysis.   Follow-up will be based on discussion and review of cath films.   Current medicines are reviewed at length with the patient today.  The patient does not have concerns regarding medicines.  Signed, Dorris Carnes, MD  07/04/2019 5:23 PM    [address], [place], [place]  [address] Phone: [phone]; Fax: [phone]

## 2019-07-04 ENCOUNTER — Other Ambulatory Visit: Payer: Self-pay

## 2019-07-04 ENCOUNTER — Encounter: Payer: Self-pay | Admitting: Internal Medicine

## 2019-07-04 ENCOUNTER — Ambulatory Visit (INDEPENDENT_AMBULATORY_CARE_PROVIDER_SITE_OTHER): Payer: Medicare Other | Admitting: Internal Medicine

## 2019-07-04 VITALS — BP 77/48 | HR 46 | Ht 66.5 in | Wt 219.0 lb

## 2019-07-04 DIAGNOSIS — I2510 Atherosclerotic heart disease of native coronary artery without angina pectoris: Secondary | ICD-10-CM

## 2019-07-04 DIAGNOSIS — E782 Mixed hyperlipidemia: Secondary | ICD-10-CM

## 2019-07-04 DIAGNOSIS — I953 Hypotension of hemodialysis: Secondary | ICD-10-CM

## 2019-07-04 DIAGNOSIS — I200 Unstable angina: Secondary | ICD-10-CM | POA: Diagnosis not present

## 2019-07-04 NOTE — Patient Instructions (Signed)
Medication Instructions:  No changes today *If you need a refill on your cardiac medications before your next appointment, please call your pharmacy*  Lab Work: none If you have labs (blood work) drawn today and your tests are completely normal, you will receive your results only by: Marland Kitchen MyChart Message (if you have MyChart) OR . A paper copy in the mail If you have any lab test that is abnormal or we need to change your treatment, we will call you to review the results.  Testing/Procedures: none  Follow-Up: Dr. [name] will be reviewing your records and then we will contact you re: further follow up

## 2019-07-12 ENCOUNTER — Other Ambulatory Visit: Payer: Self-pay | Admitting: Family

## 2019-07-12 ENCOUNTER — Ambulatory Visit
Admission: RE | Admit: 2019-07-12 | Discharge: 2019-07-12 | Disposition: A | Discharge status: Home / Self Care | Payer: Medicare Other | Source: Ambulatory Visit | Attending: Family | Admitting: Family

## 2019-07-12 DIAGNOSIS — S91302A Unspecified open wound, left foot, initial encounter: Secondary | ICD-10-CM | POA: Diagnosis not present

## 2019-07-12 DIAGNOSIS — M79672 Pain in left foot: Secondary | ICD-10-CM

## 2019-07-13 ENCOUNTER — Other Ambulatory Visit: Payer: Self-pay

## 2019-07-13 DIAGNOSIS — I998 Other disorder of circulatory system: Secondary | ICD-10-CM

## 2019-07-14 ENCOUNTER — Encounter: Payer: Self-pay | Admitting: Family

## 2019-07-14 ENCOUNTER — Ambulatory Visit (INDEPENDENT_AMBULATORY_CARE_PROVIDER_SITE_OTHER): Payer: Medicare Other | Admitting: Family

## 2019-07-14 ENCOUNTER — Ambulatory Visit (HOSPITAL_COMMUNITY)
Admission: RE | Admit: 2019-07-14 | Discharge: 2019-07-14 | Disposition: A | Discharge status: Home / Self Care | Payer: Medicare Other | Source: Ambulatory Visit | Attending: Family | Admitting: Family

## 2019-07-14 ENCOUNTER — Other Ambulatory Visit: Payer: Self-pay

## 2019-07-14 VITALS — BP 97/54 | HR 62 | Temp 97.4°F | Resp 20 | Ht 66.5 in | Wt 220.0 lb

## 2019-07-14 DIAGNOSIS — N186 End stage renal disease: Secondary | ICD-10-CM

## 2019-07-14 DIAGNOSIS — Z992 Dependence on renal dialysis: Secondary | ICD-10-CM | POA: Diagnosis not present

## 2019-07-14 DIAGNOSIS — I200 Unstable angina: Secondary | ICD-10-CM

## 2019-07-14 DIAGNOSIS — I998 Other disorder of circulatory system: Secondary | ICD-10-CM

## 2019-07-14 NOTE — Patient Instructions (Signed)
Peripheral Vascular Disease  Peripheral vascular disease (PVD) is a disease of the blood vessels that are not part of your heart and brain. A simple term for PVD is poor circulation. In most cases, PVD narrows the blood vessels that carry blood from your heart to the rest of your body. This can reduce the supply of blood to your arms, legs, and internal organs, like your stomach or kidneys. However, PVD most often affects a person's lower legs and feet. Without treatment, PVD tends to get worse. PVD can also lead to acute ischemic limb. This is when an arm or leg suddenly cannot get enough blood. This is a medical emergency. Follow these instructions at home: Lifestyle  Do not use any products that contain nicotine or tobacco, such as cigarettes and e-cigarettes. If you need help quitting, ask your doctor.  Lose weight if you are overweight. Or, stay at a healthy weight as told by your doctor.  Eat a diet that is low in fat and cholesterol. If you need help, ask your doctor.  Exercise regularly. Ask your doctor for activities that are right for you. General instructions  Take over-the-counter and prescription medicines only as told by your doctor.  Take good care of your feet: ? Wear comfortable shoes that fit well. ? Check your feet often for any cuts or sores.  Keep all follow-up visits as told by your doctor This is important. Contact a doctor if:  You have cramps in your legs when you walk.  You have leg pain when you are at rest.  You have coldness in a leg or foot.  Your skin changes.  You are unable to get or have an erection (erectile dysfunction).  You have cuts or sores on your feet that do not heal. Get help right away if:  Your arm or leg turns cold, numb, and blue.  Your arms or legs become red, warm, swollen, painful, or numb.  You have chest pain.  You have trouble breathing.  You suddenly have weakness in your face, arm, or leg.  You become very  confused or you cannot speak.  You suddenly have a very bad headache.  You suddenly cannot see. Summary  Peripheral vascular disease (PVD) is a disease of the blood vessels.  A simple term for PVD is poor circulation. Without treatment, PVD tends to get worse.  Treatment may include exercise, low fat and low cholesterol diet, and quitting smoking. This information is not intended to replace advice given to you by your health care provider. Make sure you discuss any questions you have with your health care provider. Document Released: 10/08/2009 Document Revised: 06/26/2017 Document Reviewed: 08/21/2016 Elsevier Patient Education  2020 [employer]  

## 2019-07-14 NOTE — Progress Notes (Signed)
[facility] [place]   CC: Ischemic left great toe, referred by Dr. Hollie Salk, established pt for ESRD and HD access  History of Present Illness Patrick [name] is a 63 y.o. male who is s/p placement of left upper extremity AV graft (4 mm x 7 mm tapered Gore-Tex graft) on 12-09-18 by Dr. Carlis Abbott. Pt was previouslyseen in consultation by Dr. Trula Slade for possible femoral AV graft placement. Patient has failed bilateral upper extremity graft placements in the past. Ultimately patient refused femoral graft placement and underwent bilateral upper extremity venogram to evaluate further options. Right upper extremity venogram showed central venous occlusion. On the left he did not have a central venous occlusion but did have a previous failed forearm loop graft and upper arm graft. There was one remaining patent brachial vein and a patent axillary vein so we recommended an additional attempt at left upper extremity AV graft placement.   I last saw pt on 02-25-19 for malfunctioning of his AVF, fistula gram was offered, pt declined. Pt states that his AVF is working fine now.  He denies any steal type symptoms in his left hand.   Pt returns today, referred by Dr. Hollie Salk for evaluation of left great toe ischemia. Pt states that he started having pain in his left great toe about a month ago, has had 2 separate courses of po antibiotics, one prescribed by his nephrologist, the other prescribed by Dr. Loanne Drilling, his endocrinologist.  He states that the wound on his left great toe has "dried up" since taking the antibiotics.  A week ago he developed a small shallow fissure at his right great toe.   He states that he had an xray done of his foot 2 days ago, and is scheduled to see Dr. Jacqualyn Posey, podiatrist, on 07-19-19.   He dialyzes MWF via left upper arm AVg at [facility] on [address].  He is left hand dominant.  He attempted Peritoneal Dialysis, but it failed.   He had 2  cardiac stents placed in July 2020, states he feels much better since then, he denies dyspnea, is having occasional chest pain, relieved by NTG, states his cardiologist is aware and is following him closely.   He denies fever or chills.   Diabetic: Yes Tobacco use: former smoker, quit in 2015, states he smoked rarely when he smoked  Pt meds include: Statin :Yes Betablocker: yes ASA: Yes Other anticoagulants/antiplatelets: Plavix  Past Medical History:  Diagnosis Date  . Arthritis   . Atrial flutter ([place])    Ablation 2018  . 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 Erectile dysfunction   . ESRD (end stage renal disease) on dialysis ([place])    Pt on HD 2003 >> transplanted in 2009, back on HD in 2016. [name].   . Essential hypertension   . Gout   . Hernia of abdominal wall   . History of blood transfusion   . History of cardiomyopathy   . History of kidney stones   . History of pneumonia   . Migraine   . Myocardial infarction ([place])    1996  . Pneumonia   . Secondary hyperparathyroidism ([place])   . Type 2 diabetes mellitus ([place])   . Wears glasses     Social History Social History   Tobacco Use  .  Smoking status: Former Smoker    Types: Cigarettes    Quit date: 07/04/2014    Years since quitting: 5.0  . Smokeless tobacco: Never Used  . Tobacco comment: "smoked ~ 1 pack/month when I did smoke; never a steady smoker"  Substance Use Topics  . Alcohol use: Yes    Alcohol/week: 0.0 standard drinks    Comment: rare  "2 drinks, 1-2 times/year"  . Drug use: No    Family History Family History  Problem Relation Age of Onset  . Hyperlipidemia Mother   . Hypertension Mother   . HIV Sister     Past Surgical History:  Procedure Laterality Date  . A-FLUTTER ABLATION N/A 09/24/2016   Procedure:  A-Flutter Ablation;  Surgeon: Will Meredith Leeds, MD;  Location: [place] [facility];  Service: Cardiovascular;  Laterality: N/A;  . ABDOMINAL AORTOGRAM W/LOWER EXTREMITY N/A 04/13/2017   Procedure: ABDOMINAL AORTOGRAM W/LOWER EXTREMITY;  Surgeon: Conrad Puhi, MD;  Location: [place] [facility];  Service: Cardiovascular;  Laterality: N/A;  Bilater lower extermity  . APPENDECTOMY    . AV FISTULA PLACEMENT Right 09/18/2014   Procedure: INSERTION OF ARTERIOVENOUS (AV) GORE-TEX GRAFT ARM USING 4-7MM  X 45CM STRETCH GORE-TEX VASCULAR GRAFT;  Surgeon: Rosetta Posner, MD;  Location: [place];  Service: Vascular;  Laterality: Right;  . AV FISTULA PLACEMENT Left 07/07/2016   Procedure: INSERTION OF LEFT BRACHIAL TO AXILLARY ARTERIOVENOUS (AV) GORE-TEX ARM GRAFT;  Surgeon: Conrad Scottsboro, MD;  Location: [place];  Service: Vascular;  Laterality: Left;  . AV FISTULA PLACEMENT Left 12/09/2018   Procedure: Insertion Of Arteriovenous (Av) Gore-Tex Graft Arm, left arm;  Surgeon: Marty Heck, MD;  Location: [place];  Service: Vascular;  Laterality: Left;  . CARDIAC CATHETERIZATION  ~ 2016  . COLONOSCOPY    . 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 CORONARY ATHERECTOMY N/A 02/16/2019   Procedure: CORONARY ATHERECTOMY;  Surgeon: Martinique, Peter M, MD;  Location: [place] [facility];  Service: Cardiovascular;  Laterality: N/A;  . EXCHANGE OF A DIALYSIS CATHETER Left 01/11/2018   Procedure: EXCHANGE OF TUNNELED DIALYSIS CATHETER;  Surgeon: Rosetta Posner, MD;  Location: [place];  Service: Vascular;  Laterality: Left;  . HERNIA REPAIR  2017   with nephrectomy  . INSERTION OF DIALYSIS CATHETER N/A 10/08/2017   Procedure: INSERTION OF TUNNELED DIALYSIS CATHETER;  Surgeon: Conrad Ione, MD;  Location: [place];  Service: Vascular;  Laterality: N/A;  . IR FLUORO GUIDE CV LINE LEFT  03/12/2018  . IR GENERIC HISTORICAL  05/11/2016   IR FLUORO GUIDE CV LINE  LEFT 05/11/2016 Corrie Mckusick, DO [facility]-INTERV [facility]  . IR GENERIC HISTORICAL  05/11/2016   IR US GUIDE VASC ACCESS LEFT 05/11/2016 Corrie Mckusick, DO [facility]INTER[facility]  . IR GENERIC HISTORICAL  05/11/2016   IR US GUIDE VASC ACCESS RIGHT 05/11/2016 Corrie Mckusick, DO [facility]  . IR GENERIC HISTORICAL  05/11/2016   IR RADIOLOGY PERIPHERAL GUIDED IV START 05/11/2016 Corrie Mckusick, DO [facility]-INTER[facility]  . KIDNEY TRANSPLANT  2009  . LEFT HEART CATH AND CORONARY ANGIOGRAPHY N/A 02/15/2019   Procedure: LEFT HEART CATH AND CORONARY ANGIOGRAPHY;  Surgeon: Martinique, Peter M, MD;  Location: [place] [facility];  Service: Cardiovascular;  Laterality: N/A;  . LEFT HEART CATH AND CORS/GRAFTS ANGIOGRAPHY N/A 12/04/2017   Procedure: LEFT HEART CATH AND CORS/GRAFTS ANGIOGRAPHY;  Surgeon: Belva Crome, MD;  Location:  [place] [facility];  Service: Cardiovascular;  Laterality: N/A;  . NEPHRECTOMY  2017   transplant rejected   . PERIPHERAL VASCULAR CATHETERIZATION N/A 06/04/2016   Procedure: Upper Extremity Venography;  Surgeon: Waynetta Sandy, MD;  Location: [place] [facility];  Service: Cardiovascular;  Laterality: N/A;  . PERIPHERAL VASCULAR INTERVENTION  04/13/2017   Procedure: PERIPHERAL VASCULAR INTERVENTION;  Surgeon: Conrad Netarts, MD;  Location: [place] [facility];  Service: Cardiovascular;;  Lt. Common/Exernal  Iliac  . THROMBECTOMY AND REVISION OF ARTERIOVENTOUS (AV) GORETEX  GRAFT Left 10/08/2017   Procedure: THROMBECTOMY of ARTERIOVENTOUS (AV) GORETEX  GRAFT LEFT UPPER ARM;  Surgeon: Conrad Luttrell, MD;  Location: [place];  Service: Vascular;  Laterality: Left;  . THROMBECTOMY W/ EMBOLECTOMY Left 09/14/2017   Procedure: THROMBECTOMY ARTERIOVENOUS GORE-TEX GRAFT LEFT UPPER ARM;  Surgeon: Rosetta Posner, MD;  Location: [place];  Service: Vascular;  Laterality: Left;  . UPPER EXTREMITY ANGIOGRAPHY Bilateral 10/29/2018   Procedure: UPPER EXTREMITY ANGIOGRAPHY;  Surgeon: Marty Heck, MD;  Location: [place] [facility]  [facility];  Service: Cardiovascular;  Laterality: Bilateral;  . UPPER EXTREMITY VENOGRAPHY N/A 11/16/2017   Procedure: UPPER EXTREMITY VENOGRAPHY - Right Arm;  Surgeon: Conrad [place], MD;  Location: [place] [facility];  Service: Cardiovascular;  Laterality: N/A;  . UPPER EXTREMITY VENOGRAPHY Bilateral 10/29/2018   Procedure: UPPER EXTREMITY VENOGRAPHY;  Surgeon: Marty Heck, MD;  Location: [place] [facility];  Service: Cardiovascular;  Laterality: Bilateral;    Allergies  Allergen Reactions  . Baclofen Other (See Comments)    Possibly stroke like symptoms  . Iodinated Diagnostic Agents Swelling, Rash and Other (See Comments)    Other Reaction: burning to mouth, swelling of lips  . Lipitor [Atorvastatin] Other (See Comments)    Leg pain  . Metoprolol Other (See Comments)    Headaches     Current Outpatient Medications  Medication Sig Dispense Refill  . acetaminophen (TYLENOL) 500 MG tablet Take 1,000 mg by mouth every 6 (six) hours as needed for moderate pain or headache.    . albuterol (PROVENTIL HFA;VENTOLIN HFA) 108 (90 Base) MCG/ACT inhaler Inhale 1-2 puffs into the lungs every 6 (six) hours as needed for wheezing or shortness of breath. 1 Inhaler 0  . aspirin EC 81 MG tablet Take 81 mg by mouth daily.     . calcium acetate (PHOSLO) 667 MG capsule Take 2,668 mg by mouth daily with supper.     . Calcium Carbonate-Vitamin D (CALCIUM 500/D PO) Take 3 tablets by mouth daily.     . clopidogrel (PLAVIX) 75 MG tablet TAKE 1 TABLET(75 MG) BY MOUTH DAILY 90 tablet 3  . Colchicine 0.6 MG CAPS Take 0.6 mg by mouth See admin instructions. Take 0.6 mg when first symptoms of gout flare occur, then take another 0.6 mg 14 days later as needed for gout    . DEXILANT 60 MG capsule Take 1 capsule by mouth daily.    Marland Kitchen dextromethorphan (DELSYM) 30 MG/5ML liquid Take 30 mg by mouth as needed for cough.    . docusate sodium (COLACE) 100 MG capsule Take 100 mg by mouth daily as needed for mild constipation.     Marland Kitchen ezetimibe (ZETIA) 10 MG tablet TAKE 1 TABLET(10 MG) BY MOUTH DAILY 90 tablet 3  . isosorbide mononitrate (IMDUR) 30 MG 24 hr tablet Take 1 tablet (30 mg total) by mouth daily. 90 tablet 3  . metoprolol succinate (TOPROL XL) 25 MG 24 hr tablet  Take 1 tablet (25 mg total) by mouth daily. 90 tablet 3  . multivitamin (RENA-VIT) TABS tablet Take 1 tablet by mouth daily.     . Niacin CR 1000 MG TBCR niacin ER 1,000 mg tablet,extended release 24 hr  TAKE 1 TABLET BY MOUTH DAILY    . oxyCODONE-acetaminophen (PERCOCET/ROXICET) 5-325 MG tablet Take 1 tablet by mouth every 6 (six) hours as needed. 12 tablet 0  . pantoprazole (PROTONIX) 40 MG tablet Take 1 tablet (40 mg total) by mouth daily. 90 tablet 3  . repaglinide (PRANDIN) 0.5 MG tablet Take 1 tablet (0.5 mg total) by mouth 2 (two) times daily before a meal. 180 tablet 3  . rosuvastatin (CRESTOR) 10 MG tablet Take 1 tablet (10 mg total) by mouth daily. 90 tablet 3  . nitroGLYCERIN (NITROSTAT) 0.4 MG SL tablet Place 1 tablet (0.4 mg total) under the tongue every 5 (five) minutes as needed for chest pain. 25 tablet 5   No current facility-administered medications for this visit.    ROS: See HPI for pertinent positives and negatives.   Physical Examination  Vitals:   07/14/19 1142  BP: (!) 97/54  Pulse: 62  Resp: 20  Temp: (!) 97.4 F (36.3 C)  SpO2: 95%  Weight: 220 lb (99.8 kg)  Height: 5' 6.5" (1.689 m)   Body mass index is 34.98 kg/m.  General: A&O x 3, WDWN, obese male in NAD. Gait: normal HEENT: No gross abnormalities.  Pulmonary: Respirations are non labored,fair air movement in all fields, no rales, rhonchi, or wheezes Cardiac: regular rhythm, no detected murmur.        Carotid Bruits Right Left   Negative Negative   Radial pulses are not palpable bilaterally   Adominal aortic pulse is not palpable                         VASCULAR EXAM: Extremities with ischemic changes in both great toes, see photos below, without  Gangrene; with open wounds in both great toes, see photos below.  Left upper arm AVF with palpable thrill and audible bruit. Left radial pulse is 1+ palpable   Left great toe ulcer     Right great toe shallow fissure                                                                                                           LE Pulses Right Left       FEMORAL  not palpable  2+ palpable        POPLITEAL  not palpable   not palpable       POSTERIOR TIBIAL  not palpable   not palpable        DORSALIS PEDIS      ANTERIOR TIBIAL not palpable  not palpable    Abdomen: soft, NT, no palpable masses. Scars from previous PD catheter, Large hernia at RLQ Skin: no rashes, no cellulitis, see Extremities Musculoskeletal: no muscle wasting or atrophy.  Neurologic: A&O X 3; appropriate affect, Sensation is normal;  MOTOR FUNCTION:  moving all extremities equally, motor strength 5/5 throughout. Speech is fluent/normal. CN 2-12 intact. Psychiatric: Thought content is normal, mood appropriate for clinical situation.    DATA  ABI (Date: 07/14/2019): ABI Findings: +---------+------------------+-----+-------------------+--------+ Right    Rt Pressure (mmHg)IndexWaveform           Comment  +---------+------------------+-----+-------------------+--------+ Brachial 116                                                +---------+------------------+-----+-------------------+--------+ PTA      >254              2.19 dampened monophasic         +---------+------------------+-----+-------------------+--------+ DP       67                0.58 dampened monophasic         +---------+------------------+-----+-------------------+--------+ Great Toe40                0.34 Abnormal                    +---------+------------------+-----+-------------------+--------+  +---------+------------------+-----+-------------------+----------------+ Left     Lt Pressure  (mmHg)IndexWaveform           Comment          +---------+------------------+-----+-------------------+----------------+ Brachial                                           Graft            +---------+------------------+-----+-------------------+----------------+ PTA      >254              2.19 dampened monophasic                 +---------+------------------+-----+-------------------+----------------+ DP       72                0.62 dampened monophasic                 +---------+------------------+-----+-------------------+----------------+ Great Toe                                          Unable to obtain +---------+------------------+-----+-------------------+----------------+ Summary: Right: Resting right ankle-brachial index indicates noncompressible right lower extremity arteries. The right toe-brachial index is abnormal.  Left: Resting left ankle-brachial index indicates noncompressible left lower extremity arteries. Unable to obtain toe-brachial index due to low amplitude of waveform.  Left foot Xray (07-12-19): IMPRESSION: 1. Diffuse degenerative change. Degenerative changes most prominent at the first MTP joint. 2. No focal bony lesions identified. No evidence of bony erosion. No acute abnormality. If osteomyelitis remains a clinical concern MRI of the left foot can be obtained for further evaluation. 3.  Peripheral vascular disease.   ASSESSMENT: Patrick [name] is a 63 y.o. male who presents with: left great toe ulcer x 1 month, improved with 2 courses of po antibiotics. Shallow fissure at right great toe x 21 week, mildly painful.  He has not been evaluated for PAD until today, has been evaluated for AV fistula issues.   Will schedule for aortogram with bilateral LE run off, possible intervention left LE, possible  right LE, on a non HD day, soonest available.      PLAN:  Will schedule for aortogram with bilateral LE run off, possible  intervention left LE, possible right LE, on a non HD day, soonest available.   The patient was given information about PAD including signs, symptoms, treatment, what symptoms should prompt the patient to seek immediate medical care, and risk reduction measures to take.  Clemon Chambers, RN, MSN, FNP-C [facility] Phone: [phone]  Clinic MD: Scot Dock  07/14/19 11:53 AM

## 2019-07-18 ENCOUNTER — Other Ambulatory Visit: Payer: Self-pay

## 2019-07-19 ENCOUNTER — Ambulatory Visit: Payer: Medicare Other | Admitting: Podiatry

## 2019-08-10 ENCOUNTER — Other Ambulatory Visit (HOSPITAL_COMMUNITY): Payer: Medicare Other

## 2019-08-12 ENCOUNTER — Ambulatory Visit (HOSPITAL_COMMUNITY): Admit: 2019-08-12 | Payer: Medicare Other | Admitting: Vascular Surgery

## 2019-08-12 ENCOUNTER — Encounter (HOSPITAL_COMMUNITY): Payer: Self-pay

## 2019-08-12 SURGERY — ABDOMINAL AORTOGRAM W/LOWER EXTREMITY
Anesthesia: LOCAL

## 2019-08-15 ENCOUNTER — Ambulatory Visit: Payer: Medicare Other | Admitting: Internal Medicine

## 2019-08-23 ENCOUNTER — Other Ambulatory Visit: Payer: Self-pay

## 2019-08-23 ENCOUNTER — Telehealth: Payer: Self-pay

## 2019-08-23 DIAGNOSIS — E1122 Type 2 diabetes mellitus with diabetic chronic kidney disease: Secondary | ICD-10-CM

## 2019-08-23 MED ORDER — REPAGLINIDE 0.5 MG PO TABS: 0.5000 mg | ORAL_TABLET | Freq: Two times a day (BID) | ORAL | 4 refills | Status: DC

## 2019-08-23 NOTE — Telephone Encounter (Signed)
Outpatient Medication Detail   Disp Refills Start End   repaglinide (PRANDIN) 0.5 MG tablet 60 tablet 4 08/23/2019    Sig - Route: Take 1 tablet (0.5 mg total) by mouth 2 (two) times daily before a meal. - Oral   Sent to pharmacy as: repaglinide (PRANDIN) 0.5 MG tablet   E-Prescribing Status: Receipt confirmed by pharmacy (08/23/2019 11:09 AM EST)

## 2019-08-23 NOTE — Telephone Encounter (Signed)
MEDICATION: repaglanide  PHARMACY:  [facility]  IS THIS A 90 DAY SUPPLY : yes  IS PATIENT OUT OF MEDICATION: yes  IF NOT; HOW MUCH IS LEFT:   LAST APPOINTMENT DATE: @11 /04/2019  NEXT APPOINTMENT DATE:@4 /15/2021  DO WE HAVE YOUR PERMISSION TO LEAVE A DETAILED MESSAGE:  OTHER COMMENTS:    **Let patient know to contact pharmacy at the end of the day to make sure medication is ready. **  ** Please notify patient to allow 48-72 hours to process**  **Encourage patient to contact the pharmacy for refills or they can request refills through [facility]**

## 2019-09-06 ENCOUNTER — Ambulatory Visit (INDEPENDENT_AMBULATORY_CARE_PROVIDER_SITE_OTHER): Payer: Medicare HMO | Admitting: Podiatry

## 2019-09-06 ENCOUNTER — Ambulatory Visit (INDEPENDENT_AMBULATORY_CARE_PROVIDER_SITE_OTHER): Payer: Medicare HMO

## 2019-09-06 ENCOUNTER — Other Ambulatory Visit: Payer: Self-pay

## 2019-09-06 ENCOUNTER — Other Ambulatory Visit: Payer: Self-pay | Admitting: Podiatry

## 2019-09-06 DIAGNOSIS — I739 Peripheral vascular disease, unspecified: Secondary | ICD-10-CM

## 2019-09-06 DIAGNOSIS — L03032 Cellulitis of left toe: Secondary | ICD-10-CM

## 2019-09-06 DIAGNOSIS — L97521 Non-pressure chronic ulcer of other part of left foot limited to breakdown of skin: Secondary | ICD-10-CM

## 2019-09-06 DIAGNOSIS — Z940 Kidney transplant status: Secondary | ICD-10-CM | POA: Insufficient documentation

## 2019-09-06 DIAGNOSIS — L97511 Non-pressure chronic ulcer of other part of right foot limited to breakdown of skin: Secondary | ICD-10-CM | POA: Diagnosis not present

## 2019-09-06 DIAGNOSIS — M79671 Pain in right foot: Secondary | ICD-10-CM

## 2019-09-06 DIAGNOSIS — L97501 Non-pressure chronic ulcer of other part of unspecified foot limited to breakdown of skin: Secondary | ICD-10-CM

## 2019-09-06 DIAGNOSIS — E559 Vitamin D deficiency, unspecified: Secondary | ICD-10-CM | POA: Insufficient documentation

## 2019-09-06 DIAGNOSIS — M79672 Pain in left foot: Secondary | ICD-10-CM

## 2019-09-06 MED ORDER — DOXYCYCLINE HYCLATE 100 MG PO TABS: 100.0000 mg | ORAL_TABLET | Freq: Two times a day (BID) | ORAL | 0 refills | Status: DC

## 2019-09-06 NOTE — Progress Notes (Signed)
Subjective:   Patient ID: Patrick [name], male   DOB: 64 y.o.   MRN: [id number]   HPI 64 year old male presents the office today for concerns of wounds to both of his big toes which seems to have been ongoing for the last couple of months.  He states on the right side started some dry skin that started to flake off.  He also had a wound on his left big toe. He has noticed them starting to "dry up".   He has noticed over the last 2 weeks increased pain as well as some redness of his left big toe.  He had an appointment scheduled for 07/19/2019 with me for the same issue but the patient cancelled.   He was scheduled for angio but cancelled because he didn't think he needed it.   ABI 07/04/2019: Summary:  Right: Resting right ankle-brachial index indicates noncompressible right  lower extremity arteries. The right toe-brachial index is abnormal.   Left: Resting left ankle-brachial index indicates noncompressible left  lower extremity arteries. Unable to obtain toe-brachial index due to low  amplitude of waveform.   Review of Systems  All other systems reviewed and are negative.       Objective:  Physical Exam  General: AAO x3, NAD  Dermatological: On the left hallux toenail nail is loose and only attached on the proximal aspect.  Scab, eschar to the distal aspect there is localized edema and faint erythema to the hallux.  No ascending cellulitis.  No fluctuation or crepitation.  There is no malodor.  On the right hallux appears to be a skin fissure which has turned into a superficial eschar.  There is no fluctuation crepitation there is no malodor.  No other open lesions.        Vascular: Dorsalis Pedis artery and Posterior Tibial artery pedal pulses are decreased bilateral. No varicosities and no lower extremity edema present bilateral. There is no pain with calf compression, swelling, warmth, erythema.   Neruologic: Sensation decreased with SWMF   Musculoskeletal: No gross  boney pedal deformities bilateral. No pain, crepitus, or limitation noted with foot and ankle range of motion bilateral.        Assessment:   Chronic bilateral ulcerations with PAD    Plan:  -Treatment options discussed including all alternatives, risks, and complications -Etiology of symptoms were discussed -X-rays were obtained and reviewed with the patient. There is no evidence of acute osteomyelitis or soft tissue emphysema. -The nail is about to come off on its own.  Given the pain has been worsening as well as mild redness will start doxycycline. -Keep the areas clean and dry. -Discussed the importance of follow-up with vascular surgery.  After discussion he is amenable to the arteriogram.  He will contact vascular surgery to get scheduled for this. -Offloading -Monitor for any clinical signs or symptoms of infection and directed to call the office immediately should any occur or go to the ER.  Trula Slade DPM

## 2019-09-16 ENCOUNTER — Other Ambulatory Visit: Payer: Self-pay | Admitting: Internal Medicine

## 2019-09-16 MED ORDER — NITROGLYCERIN 0.4 MG SL SUBL: 0.4000 mg | SUBLINGUAL_TABLET | SUBLINGUAL | 6 refills | Status: DC | PRN

## 2019-09-16 NOTE — Telephone Encounter (Signed)
Pt's medication was sent to pt's pharmacy as requested. Confirmation received.  °

## 2019-09-20 ENCOUNTER — Ambulatory Visit: Payer: Medicare HMO | Admitting: Podiatry

## 2019-10-18 ENCOUNTER — Other Ambulatory Visit: Payer: Self-pay

## 2019-10-18 ENCOUNTER — Ambulatory Visit (INDEPENDENT_AMBULATORY_CARE_PROVIDER_SITE_OTHER): Payer: Medicare HMO | Admitting: Podiatry

## 2019-10-18 DIAGNOSIS — L97501 Non-pressure chronic ulcer of other part of unspecified foot limited to breakdown of skin: Secondary | ICD-10-CM | POA: Diagnosis not present

## 2019-10-18 DIAGNOSIS — I739 Peripheral vascular disease, unspecified: Secondary | ICD-10-CM | POA: Diagnosis not present

## 2019-10-18 MED ORDER — SILVER SULFADIAZINE 1 % EX CREA: 1.0000 "application " | TOPICAL_CREAM | Freq: Every day | CUTANEOUS | 0 refills | Status: DC

## 2019-10-19 NOTE — Progress Notes (Signed)
Subjective: 64 year old male presents the office today for Evaluation of ulcerations to both feet.  I have not seen him since February 9.  He has not yet followed up with vascular surgery.  States the wounds are healing very slowly.  Denies any drainage or pus. Denies any systemic complaints such as fevers, chills, nausea, vomiting. No acute changes since last appointment, and no other complaints at this time.   Objective: AAO x3, NAD DP, PT pulses decreased bilaterally. Dry eschar present to the right and left hallux as well as left fifth digit with tenderness palpation of the lesions.  No surrounding erythema, ascending cellulitis.  There is no fluctuation palpitation.  There is no malodor. No open lesions or pre-ulcerative lesions.  No pain with calf compression, swelling, warmth, erythema          Assessment: Nonhealing ulcerations with PAD   Plan: -All treatment options discussed with the patient including all alternatives, risks, complications.  -No obvious signs of infection.  Will use mometasone into the areas daily.  Continue with offloading.  7 difficulty wearing shoes and dispensed bilateral surgical shoes.  Also contact the vascular surgery office in regards to angiogram and they will contact him. -Patient encouraged to call the office with any questions, concerns, change in symptoms.   Trula Slade DPM

## 2019-10-20 IMAGING — MR MR MRA HEAD W/O CM
12 of 14 series · 33 of 48 positions shown · non-contrast
Comparison: CT HEAD August 19, 2018

CLINICAL DATA: Headaches since last night, LEFT arm weakness today.
History of end-stage renal disease on dialysis, hypertension and
diabetes.

EXAM:
MRI HEAD WITHOUT CONTRAST
MRA HEAD WITHOUT CONTRAST
TECHNIQUE: Multiplanar, multiecho pulse sequences of the brain and surrounding
structures were obtained without intravenous contrast. Angiographic
images of the head were obtained using MRA technique without
contrast.

[Series 5: ax dwi_tracew · axial · 4.0mm · 0.88mm/px · z∈[-68,+59]mm · 3 of 68 slices shown]
[im 1/68]
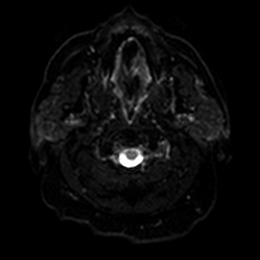
[im 34/68]
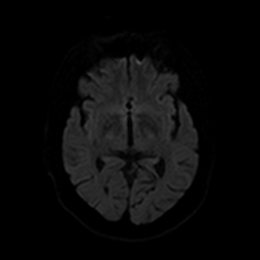
[im 68/68]
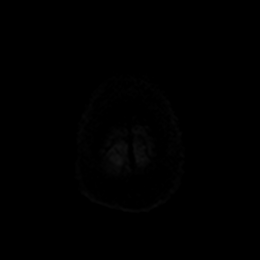

[Series 6: ax dwi_adc · axial · 4.0mm · 0.88mm/px · z∈[-68,+59]mm · 2 of 34 slices shown]
[im 1/34]
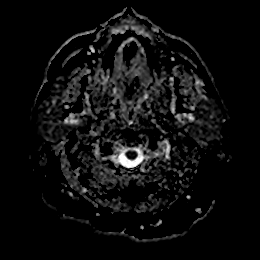
[im 34/34]
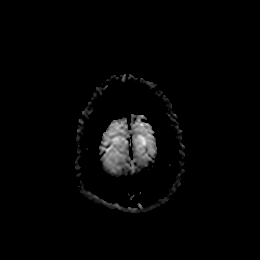

[Series 11: DWI · coronal · 4.0mm · 0.88mm/px · 4 of 64 slices shown (1 of 2)]
[im 1/64]
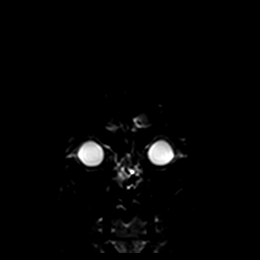
[im 22/64]
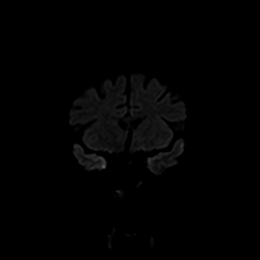
[im 43/64]
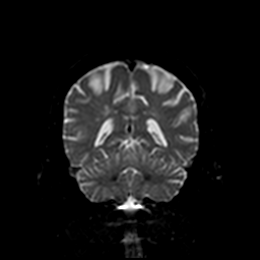
[im 64/64]
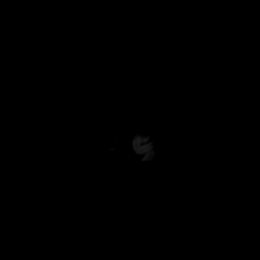

[Series 12: DWI · coronal · 4.0mm · 0.88mm/px · 2 of 32 slices shown (2 of 2)]
[im 1/32]
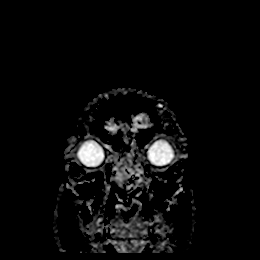
[im 32/32]
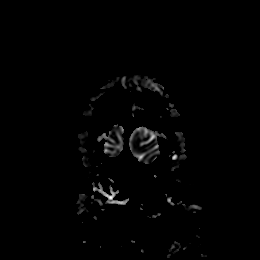

[Series 13: T1 · sagittal · 5.0mm · 0.75mm/px · 1 of 23 slices shown]
[im 1/23]
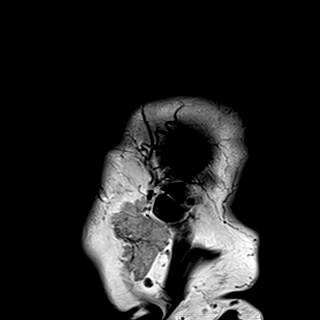

[Series 14: T2 · axial · 5.0mm · 0.72mm/px · z∈[-75,+64]mm · 2 of 25 slices shown (1 of 2)]
[im 1/25]
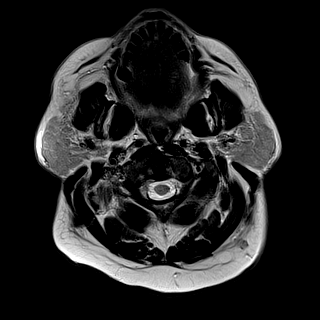
[im 25/25]
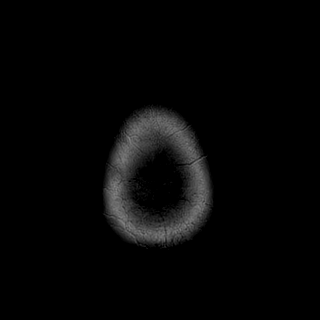

[Series 15: FLAIR · axial · 5.0mm · 0.45mm/px · z∈[-77,+62]mm · 2 of 25 slices shown]
[im 1/25]
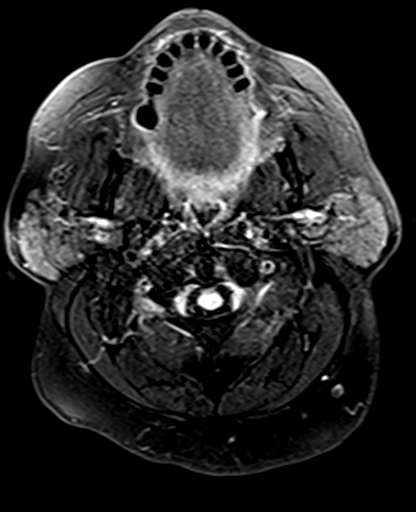
[im 25/25]
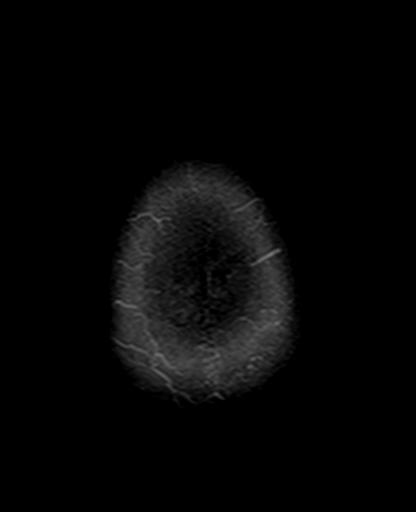

[Series 16: mag_images · axial · 3.0mm · 0.90mm/px · z∈[-85,+86]mm · 4 of 60 slices shown]
[im 1/60]
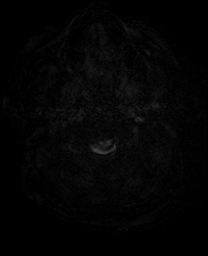
[im 20/60]
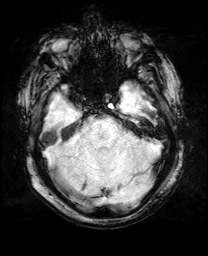
[im 40/60]
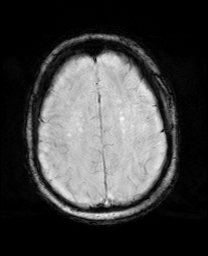
[im 60/60]
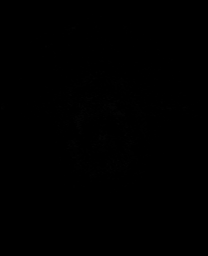

[Series 17: pha_images · axial · 3.0mm · 0.90mm/px · z∈[-85,+86]mm · 4 of 59 slices shown]
[im 1/59]
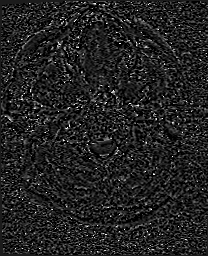
[im 20/59]
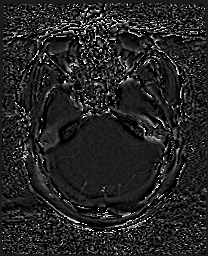
[im 39/59]
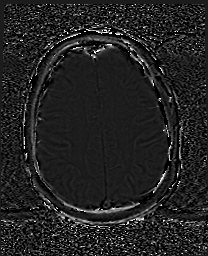
[im 59/59]
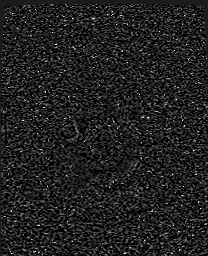

[Series 18: swi_images · axial · 3.0mm · 0.90mm/px · z∈[-85,+86]mm · 4 of 60 slices shown]
[im 1/60]
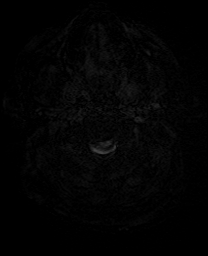
[im 20/60]
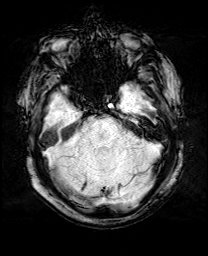
[im 40/60]
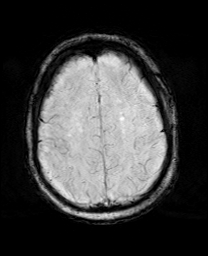
[im 60/60]
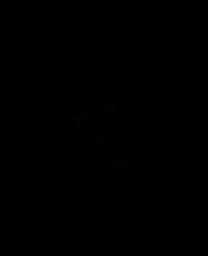

[Series 19: mip_images(sw) · axial · 24.0mm · 0.90mm/px · z∈[-75,+76]mm · 3 of 53 slices shown]
[im 1/53]
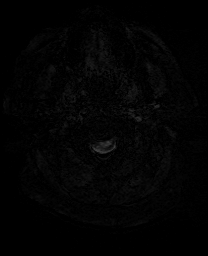
[im 27/53]
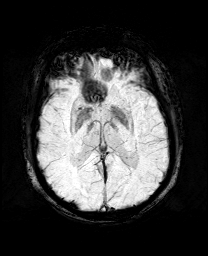
[im 53/53]
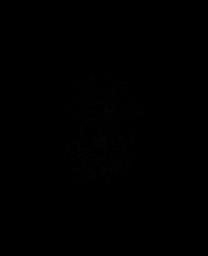

[Series 21: T2 · coronal · 5.0mm · 0.34mm/px · 2 of 29 slices shown (2 of 2)]
[im 1/29]
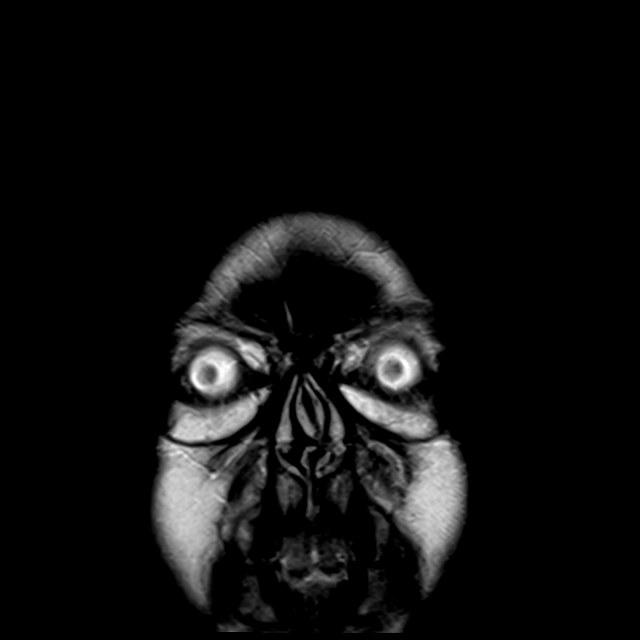
[im 29/29]
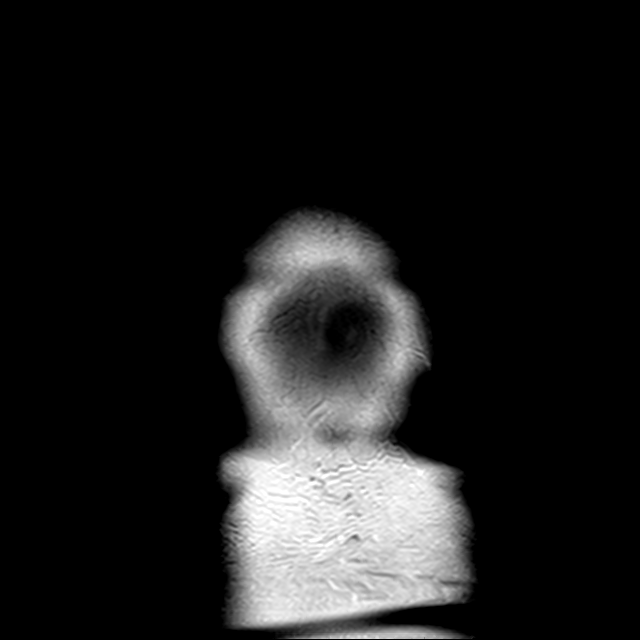

[33 of 48 positions shown; findings below may reference images not displayed]

FINDINGS: MRI HEAD FINDINGS

INTRACRANIAL CONTENTS: No reduced diffusion to suggest acute
ischemia. No susceptibility artifact to suggest hemorrhage. The
ventricles and sulci are normal for patient's age. Patchy
supratentorial white matter FLAIR T2 hyperintensities. No suspicious
parenchymal signal, masses, mass effect. No abnormal extra-axial
fluid collections. No extra-axial masses.

VASCULAR: Normal major intracranial vascular flow voids present at
skull base.

SKULL AND UPPER CERVICAL SPINE: No abnormal sellar expansion. No
suspicious calvarial bone marrow signal. Craniocervical junction
maintained.

SINUSES/ORBITS: Mild paranasal sinus mucosal thickening. Included
mastoid air cells are well aerated. Old LEFT medial orbital blowout
fracture.

OTHER: None.

MRA HEAD FINDINGS

ANTERIOR CIRCULATION: Normal flow related enhancement of the
included cervical, petrous, cavernous and supraclinoid internal
carotid arteries. Patent anterior communicating artery. Patent
anterior and middle cerebral arteries.

No large vessel occlusion, flow limiting stenosis, aneurysm.

POSTERIOR CIRCULATION: LEFT vertebral artery is dominant. RIGHT
vertebral artery terminates in the posterior inferior cerebellar
artery. Vertebrobasilar arteries are patent, with normal flow
related enhancement of the main branch vessels. Patent posterior
cerebral arteries. Robust bilateral posterior communicating arteries
present.

No large vessel occlusion, flow limiting stenosis,  aneurysm.

ANATOMIC VARIANTS: None.

Source images and MIP images were reviewed.
IMPRESSION: MRI HEAD:

1. No acute intracranial process.
2. Mild-to-moderate chronic small vessel ischemic changes.

MRA HEAD:

1. Negative noncontrast MRA head.

## 2019-10-24 ENCOUNTER — Inpatient Hospital Stay (HOSPITAL_COMMUNITY)
Admission: EM | Admit: 2019-10-24 | Discharge: 2019-12-13 | DRG: 216 | Disposition: A | Discharge status: Other Acute Inpatient Hospital | Payer: Medicare HMO | Attending: Cardiothoracic Surgery | Admitting: Cardiothoracic Surgery

## 2019-10-24 ENCOUNTER — Other Ambulatory Visit: Payer: Self-pay

## 2019-10-24 ENCOUNTER — Emergency Department (HOSPITAL_COMMUNITY): Payer: Medicare HMO

## 2019-10-24 DIAGNOSIS — I214 Non-ST elevation (NSTEMI) myocardial infarction: Secondary | ICD-10-CM | POA: Diagnosis not present

## 2019-10-24 DIAGNOSIS — J95821 Acute postprocedural respiratory failure: Secondary | ICD-10-CM | POA: Diagnosis not present

## 2019-10-24 DIAGNOSIS — Z79899 Other long term (current) drug therapy: Secondary | ICD-10-CM

## 2019-10-24 DIAGNOSIS — Z1621 Resistance to vancomycin: Secondary | ICD-10-CM | POA: Diagnosis not present

## 2019-10-24 DIAGNOSIS — Z79891 Long term (current) use of opiate analgesic: Secondary | ICD-10-CM

## 2019-10-24 DIAGNOSIS — I2510 Atherosclerotic heart disease of native coronary artery without angina pectoris: Secondary | ICD-10-CM | POA: Diagnosis not present

## 2019-10-24 DIAGNOSIS — G9349 Other encephalopathy: Secondary | ICD-10-CM | POA: Diagnosis not present

## 2019-10-24 DIAGNOSIS — N2581 Secondary hyperparathyroidism of renal origin: Secondary | ICD-10-CM | POA: Diagnosis present

## 2019-10-24 DIAGNOSIS — M1990 Unspecified osteoarthritis, unspecified site: Secondary | ICD-10-CM | POA: Diagnosis present

## 2019-10-24 DIAGNOSIS — Z888 Allergy status to other drugs, medicaments and biological substances status: Secondary | ICD-10-CM

## 2019-10-24 DIAGNOSIS — I469 Cardiac arrest, cause unspecified: Secondary | ICD-10-CM | POA: Diagnosis not present

## 2019-10-24 DIAGNOSIS — I472 Ventricular tachycardia: Secondary | ICD-10-CM | POA: Diagnosis not present

## 2019-10-24 DIAGNOSIS — M109 Gout, unspecified: Secondary | ICD-10-CM | POA: Diagnosis present

## 2019-10-24 DIAGNOSIS — T82855A Stenosis of coronary artery stent, initial encounter: Secondary | ICD-10-CM | POA: Diagnosis present

## 2019-10-24 DIAGNOSIS — G4733 Obstructive sleep apnea (adult) (pediatric): Secondary | ICD-10-CM | POA: Diagnosis present

## 2019-10-24 DIAGNOSIS — L89890 Pressure ulcer of other site, unstageable: Secondary | ICD-10-CM | POA: Diagnosis present

## 2019-10-24 DIAGNOSIS — I25810 Atherosclerosis of coronary artery bypass graft(s) without angina pectoris: Secondary | ICD-10-CM | POA: Diagnosis present

## 2019-10-24 DIAGNOSIS — Z8249 Family history of ischemic heart disease and other diseases of the circulatory system: Secondary | ICD-10-CM

## 2019-10-24 DIAGNOSIS — I5031 Acute diastolic (congestive) heart failure: Secondary | ICD-10-CM | POA: Diagnosis not present

## 2019-10-24 DIAGNOSIS — Y830 Surgical operation with transplant of whole organ as the cause of abnormal reaction of the patient, or of later complication, without mention of misadventure at the time of the procedure: Secondary | ICD-10-CM | POA: Diagnosis present

## 2019-10-24 DIAGNOSIS — J9601 Acute respiratory failure with hypoxia: Secondary | ICD-10-CM | POA: Diagnosis not present

## 2019-10-24 DIAGNOSIS — Y831 Surgical operation with implant of artificial internal device as the cause of abnormal reaction of the patient, or of later complication, without mention of misadventure at the time of the procedure: Secondary | ICD-10-CM | POA: Diagnosis present

## 2019-10-24 DIAGNOSIS — I351 Nonrheumatic aortic (valve) insufficiency: Secondary | ICD-10-CM | POA: Diagnosis present

## 2019-10-24 DIAGNOSIS — K831 Obstruction of bile duct: Secondary | ICD-10-CM | POA: Diagnosis not present

## 2019-10-24 DIAGNOSIS — I350 Nonrheumatic aortic (valve) stenosis: Secondary | ICD-10-CM | POA: Diagnosis not present

## 2019-10-24 DIAGNOSIS — Z905 Acquired absence of kidney: Secondary | ICD-10-CM

## 2019-10-24 DIAGNOSIS — J810 Acute pulmonary edema: Secondary | ICD-10-CM | POA: Diagnosis not present

## 2019-10-24 DIAGNOSIS — D631 Anemia in chronic kidney disease: Secondary | ICD-10-CM | POA: Diagnosis present

## 2019-10-24 DIAGNOSIS — R197 Diarrhea, unspecified: Secondary | ICD-10-CM | POA: Diagnosis not present

## 2019-10-24 DIAGNOSIS — Z87442 Personal history of urinary calculi: Secondary | ICD-10-CM

## 2019-10-24 DIAGNOSIS — B952 Enterococcus as the cause of diseases classified elsewhere: Secondary | ICD-10-CM | POA: Diagnosis not present

## 2019-10-24 DIAGNOSIS — Z006 Encounter for examination for normal comparison and control in clinical research program: Secondary | ICD-10-CM

## 2019-10-24 DIAGNOSIS — Z7982 Long term (current) use of aspirin: Secondary | ICD-10-CM

## 2019-10-24 DIAGNOSIS — R570 Cardiogenic shock: Secondary | ICD-10-CM

## 2019-10-24 DIAGNOSIS — D649 Anemia, unspecified: Secondary | ICD-10-CM | POA: Diagnosis not present

## 2019-10-24 DIAGNOSIS — Z830 Family history of human immunodeficiency virus [HIV] disease: Secondary | ICD-10-CM

## 2019-10-24 DIAGNOSIS — N179 Acute kidney failure, unspecified: Secondary | ICD-10-CM | POA: Diagnosis not present

## 2019-10-24 DIAGNOSIS — J9811 Atelectasis: Secondary | ICD-10-CM | POA: Diagnosis present

## 2019-10-24 DIAGNOSIS — I5022 Chronic systolic (congestive) heart failure: Secondary | ICD-10-CM | POA: Diagnosis not present

## 2019-10-24 DIAGNOSIS — I25110 Atherosclerotic heart disease of native coronary artery with unstable angina pectoris: Secondary | ICD-10-CM | POA: Diagnosis not present

## 2019-10-24 DIAGNOSIS — E1122 Type 2 diabetes mellitus with diabetic chronic kidney disease: Secondary | ICD-10-CM | POA: Diagnosis present

## 2019-10-24 DIAGNOSIS — Z20822 Contact with and (suspected) exposure to covid-19: Secondary | ICD-10-CM | POA: Diagnosis present

## 2019-10-24 DIAGNOSIS — R34 Anuria and oliguria: Secondary | ICD-10-CM | POA: Diagnosis not present

## 2019-10-24 DIAGNOSIS — J690 Pneumonitis due to inhalation of food and vomit: Secondary | ICD-10-CM | POA: Diagnosis not present

## 2019-10-24 DIAGNOSIS — I255 Ischemic cardiomyopathy: Secondary | ICD-10-CM | POA: Diagnosis present

## 2019-10-24 DIAGNOSIS — Z9911 Dependence on respirator [ventilator] status: Secondary | ICD-10-CM | POA: Diagnosis not present

## 2019-10-24 DIAGNOSIS — E876 Hypokalemia: Secondary | ICD-10-CM | POA: Diagnosis not present

## 2019-10-24 DIAGNOSIS — I483 Typical atrial flutter: Secondary | ICD-10-CM | POA: Diagnosis present

## 2019-10-24 DIAGNOSIS — J90 Pleural effusion, not elsewhere classified: Secondary | ICD-10-CM | POA: Diagnosis not present

## 2019-10-24 DIAGNOSIS — Z6835 Body mass index (BMI) 35.0-35.9, adult: Secondary | ICD-10-CM

## 2019-10-24 DIAGNOSIS — R079 Chest pain, unspecified: Secondary | ICD-10-CM | POA: Diagnosis present

## 2019-10-24 DIAGNOSIS — Y712 Prosthetic and other implants, materials and accessory cardiovascular devices associated with adverse incidents: Secondary | ICD-10-CM | POA: Diagnosis present

## 2019-10-24 DIAGNOSIS — K567 Ileus, unspecified: Secondary | ICD-10-CM | POA: Diagnosis not present

## 2019-10-24 DIAGNOSIS — Y832 Surgical operation with anastomosis, bypass or graft as the cause of abnormal reaction of the patient, or of later complication, without mention of misadventure at the time of the procedure: Secondary | ICD-10-CM | POA: Diagnosis not present

## 2019-10-24 DIAGNOSIS — R109 Unspecified abdominal pain: Secondary | ICD-10-CM

## 2019-10-24 DIAGNOSIS — I5021 Acute systolic (congestive) heart failure: Secondary | ICD-10-CM | POA: Diagnosis not present

## 2019-10-24 DIAGNOSIS — Z7902 Long term (current) use of antithrombotics/antiplatelets: Secondary | ICD-10-CM

## 2019-10-24 DIAGNOSIS — T8612 Kidney transplant failure: Secondary | ICD-10-CM | POA: Diagnosis present

## 2019-10-24 DIAGNOSIS — L299 Pruritus, unspecified: Secondary | ICD-10-CM | POA: Diagnosis not present

## 2019-10-24 DIAGNOSIS — L89152 Pressure ulcer of sacral region, stage 2: Secondary | ICD-10-CM | POA: Diagnosis not present

## 2019-10-24 DIAGNOSIS — I462 Cardiac arrest due to underlying cardiac condition: Secondary | ICD-10-CM | POA: Diagnosis not present

## 2019-10-24 DIAGNOSIS — E43 Unspecified severe protein-calorie malnutrition: Secondary | ICD-10-CM | POA: Diagnosis not present

## 2019-10-24 DIAGNOSIS — I482 Chronic atrial fibrillation, unspecified: Secondary | ICD-10-CM | POA: Diagnosis not present

## 2019-10-24 DIAGNOSIS — Z01810 Encounter for preprocedural cardiovascular examination: Secondary | ICD-10-CM | POA: Diagnosis not present

## 2019-10-24 DIAGNOSIS — Z992 Dependence on renal dialysis: Secondary | ICD-10-CM

## 2019-10-24 DIAGNOSIS — I340 Nonrheumatic mitral (valve) insufficiency: Secondary | ICD-10-CM | POA: Diagnosis not present

## 2019-10-24 DIAGNOSIS — Z9889 Other specified postprocedural states: Secondary | ICD-10-CM

## 2019-10-24 DIAGNOSIS — D696 Thrombocytopenia, unspecified: Secondary | ICD-10-CM | POA: Diagnosis not present

## 2019-10-24 DIAGNOSIS — D509 Iron deficiency anemia, unspecified: Secondary | ICD-10-CM | POA: Diagnosis not present

## 2019-10-24 DIAGNOSIS — I5020 Unspecified systolic (congestive) heart failure: Secondary | ICD-10-CM | POA: Diagnosis not present

## 2019-10-24 DIAGNOSIS — Z87891 Personal history of nicotine dependence: Secondary | ICD-10-CM

## 2019-10-24 DIAGNOSIS — I252 Old myocardial infarction: Secondary | ICD-10-CM

## 2019-10-24 DIAGNOSIS — I5043 Acute on chronic combined systolic (congestive) and diastolic (congestive) heart failure: Secondary | ICD-10-CM | POA: Diagnosis not present

## 2019-10-24 DIAGNOSIS — I509 Heart failure, unspecified: Secondary | ICD-10-CM | POA: Diagnosis not present

## 2019-10-24 DIAGNOSIS — J439 Emphysema, unspecified: Secondary | ICD-10-CM | POA: Diagnosis present

## 2019-10-24 DIAGNOSIS — J9621 Acute and chronic respiratory failure with hypoxia: Secondary | ICD-10-CM | POA: Diagnosis not present

## 2019-10-24 DIAGNOSIS — Z83438 Family history of other disorder of lipoprotein metabolism and other lipidemia: Secondary | ICD-10-CM

## 2019-10-24 DIAGNOSIS — I9789 Other postprocedural complications and disorders of the circulatory system, not elsewhere classified: Secondary | ICD-10-CM | POA: Diagnosis not present

## 2019-10-24 DIAGNOSIS — R579 Shock, unspecified: Secondary | ICD-10-CM | POA: Diagnosis not present

## 2019-10-24 DIAGNOSIS — Y92230 Patient room in hospital as the place of occurrence of the external cause: Secondary | ICD-10-CM | POA: Diagnosis not present

## 2019-10-24 DIAGNOSIS — Z789 Other specified health status: Secondary | ICD-10-CM

## 2019-10-24 DIAGNOSIS — E669 Obesity, unspecified: Secondary | ICD-10-CM | POA: Diagnosis present

## 2019-10-24 DIAGNOSIS — R0602 Shortness of breath: Secondary | ICD-10-CM

## 2019-10-24 DIAGNOSIS — Z951 Presence of aortocoronary bypass graft: Secondary | ICD-10-CM

## 2019-10-24 DIAGNOSIS — R739 Hyperglycemia, unspecified: Secondary | ICD-10-CM | POA: Diagnosis not present

## 2019-10-24 DIAGNOSIS — Z95820 Peripheral vascular angioplasty status with implants and grafts: Secondary | ICD-10-CM

## 2019-10-24 DIAGNOSIS — Z09 Encounter for follow-up examination after completed treatment for conditions other than malignant neoplasm: Secondary | ICD-10-CM

## 2019-10-24 DIAGNOSIS — I480 Paroxysmal atrial fibrillation: Secondary | ICD-10-CM | POA: Diagnosis not present

## 2019-10-24 DIAGNOSIS — E785 Hyperlipidemia, unspecified: Secondary | ICD-10-CM | POA: Diagnosis present

## 2019-10-24 DIAGNOSIS — I5023 Acute on chronic systolic (congestive) heart failure: Secondary | ICD-10-CM | POA: Diagnosis not present

## 2019-10-24 DIAGNOSIS — Z8701 Personal history of pneumonia (recurrent): Secondary | ICD-10-CM

## 2019-10-24 DIAGNOSIS — E1152 Type 2 diabetes mellitus with diabetic peripheral angiopathy with gangrene: Secondary | ICD-10-CM | POA: Diagnosis not present

## 2019-10-24 DIAGNOSIS — Z91041 Radiographic dye allergy status: Secondary | ICD-10-CM

## 2019-10-24 DIAGNOSIS — D62 Acute posthemorrhagic anemia: Secondary | ICD-10-CM | POA: Diagnosis not present

## 2019-10-24 DIAGNOSIS — E875 Hyperkalemia: Secondary | ICD-10-CM | POA: Diagnosis not present

## 2019-10-24 DIAGNOSIS — N186 End stage renal disease: Secondary | ICD-10-CM | POA: Diagnosis present

## 2019-10-24 DIAGNOSIS — K7200 Acute and subacute hepatic failure without coma: Secondary | ICD-10-CM | POA: Diagnosis not present

## 2019-10-24 DIAGNOSIS — Z4659 Encounter for fitting and adjustment of other gastrointestinal appliance and device: Secondary | ICD-10-CM

## 2019-10-24 DIAGNOSIS — J9600 Acute respiratory failure, unspecified whether with hypoxia or hypercapnia: Secondary | ICD-10-CM

## 2019-10-24 DIAGNOSIS — I953 Hypotension of hemodialysis: Secondary | ICD-10-CM | POA: Diagnosis not present

## 2019-10-24 DIAGNOSIS — I132 Hypertensive heart and chronic kidney disease with heart failure and with stage 5 chronic kidney disease, or end stage renal disease: Secondary | ICD-10-CM | POA: Diagnosis present

## 2019-10-24 DIAGNOSIS — E1165 Type 2 diabetes mellitus with hyperglycemia: Secondary | ICD-10-CM | POA: Diagnosis not present

## 2019-10-24 DIAGNOSIS — T8141XA Infection following a procedure, superficial incisional surgical site, initial encounter: Secondary | ICD-10-CM | POA: Diagnosis not present

## 2019-10-24 DIAGNOSIS — Z978 Presence of other specified devices: Secondary | ICD-10-CM

## 2019-10-24 DIAGNOSIS — L8990 Pressure ulcer of unspecified site, unspecified stage: Secondary | ICD-10-CM | POA: Insufficient documentation

## 2019-10-24 DIAGNOSIS — I5041 Acute combined systolic (congestive) and diastolic (congestive) heart failure: Secondary | ICD-10-CM | POA: Diagnosis not present

## 2019-10-24 DIAGNOSIS — Z9049 Acquired absence of other specified parts of digestive tract: Secondary | ICD-10-CM

## 2019-10-24 DIAGNOSIS — E782 Mixed hyperlipidemia: Secondary | ICD-10-CM | POA: Diagnosis not present

## 2019-10-24 DIAGNOSIS — R7401 Elevation of levels of liver transaminase levels: Secondary | ICD-10-CM | POA: Diagnosis not present

## 2019-10-24 DIAGNOSIS — D72829 Elevated white blood cell count, unspecified: Secondary | ICD-10-CM | POA: Diagnosis not present

## 2019-10-24 DIAGNOSIS — D539 Nutritional anemia, unspecified: Secondary | ICD-10-CM | POA: Diagnosis present

## 2019-10-24 DIAGNOSIS — Z9689 Presence of other specified functional implants: Secondary | ICD-10-CM

## 2019-10-24 LAB — CBC WITH DIFFERENTIAL/PLATELET
Abs Immature Granulocytes: 0.03 10*3/uL (ref 0.00–0.07)
Basophils Absolute: 0.1 10*3/uL (ref 0.0–0.1)
Basophils Relative: 1 %
Eosinophils Absolute: 0.2 10*3/uL (ref 0.0–0.5)
Eosinophils Relative: 2 %
HCT: 27.7 % — ABNORMAL LOW (ref 39.0–52.0)
Hemoglobin: 8.6 g/dL — ABNORMAL LOW (ref 13.0–17.0)
Immature Granulocytes: 0 %
Lymphocytes Relative: 11 %
Lymphs Abs: 1 10*3/uL (ref 0.7–4.0)
MCH: 31.4 pg (ref 26.0–34.0)
MCHC: 31 g/dL (ref 30.0–36.0)
MCV: 101.1 fL — ABNORMAL HIGH (ref 80.0–100.0)
Monocytes Absolute: 0.7 10*3/uL (ref 0.1–1.0)
Monocytes Relative: 8 %
Neutro Abs: 7.3 10*3/uL (ref 1.7–7.7)
Neutrophils Relative %: 78 %
Platelets: 150 10*3/uL (ref 150–400)
RBC: 2.74 MIL/uL — ABNORMAL LOW (ref 4.22–5.81)
RDW: 15.4 % (ref 11.5–15.5)
WBC: 9.3 10*3/uL (ref 4.0–10.5)
nRBC: 0 % (ref 0.0–0.2)

## 2019-10-24 LAB — BASIC METABOLIC PANEL
Anion gap: 18 — ABNORMAL HIGH (ref 5–15)
BUN: 48 mg/dL — ABNORMAL HIGH (ref 8–23)
CO2: 22 mmol/L (ref 22–32)
Calcium: 9.5 mg/dL (ref 8.9–10.3)
Chloride: 98 mmol/L (ref 98–111)
Creatinine, Ser: 14.96 mg/dL — ABNORMAL HIGH (ref 0.61–1.24)
GFR calc Af Amer: 3 mL/min — ABNORMAL LOW (ref >60)
GFR calc non Af Amer: 3 mL/min — ABNORMAL LOW (ref >60)
Glucose, Bld: 134 mg/dL — ABNORMAL HIGH (ref 70–99)
Potassium: 4.2 mmol/L (ref 3.5–5.1)
Sodium: 138 mmol/L (ref 135–145)

## 2019-10-24 LAB — RESPIRATORY PANEL BY RT PCR (FLU A&B, COVID)
Influenza A by PCR: NEGATIVE
Influenza B by PCR: NEGATIVE
SARS Coronavirus 2 by RT PCR: NEGATIVE

## 2019-10-24 LAB — HEPARIN LEVEL (UNFRACTIONATED): Heparin Unfractionated: 0.53 IU/mL (ref 0.30–0.70)

## 2019-10-24 LAB — PREPARE RBC (CROSSMATCH)

## 2019-10-24 LAB — TROPONIN I (HIGH SENSITIVITY): Troponin I (High Sensitivity): 2995 ng/L (ref <18)

## 2019-10-24 MED ORDER — ASPIRIN 81 MG PO CHEW
324.0000 mg | CHEWABLE_TABLET | Freq: Once | ORAL | Status: DC
  Filled 2019-10-24: qty 4

## 2019-10-24 MED ORDER — DIPHENHYDRAMINE HCL 25 MG PO CAPS
50.0000 mg | ORAL_CAPSULE | Freq: Once | ORAL | Status: AC
  Administered 2019-10-25: 50 mg via ORAL
  Filled 2019-10-24: qty 2

## 2019-10-24 MED ORDER — NITROGLYCERIN IN D5W 200-5 MCG/ML-% IV SOLN
0.0000 ug/min | INTRAVENOUS | Status: DC
  Administered 2019-10-25 – 2019-10-26 (×2): 5 ug/min via INTRAVENOUS
  Filled 2019-10-24 (×2): qty 250

## 2019-10-24 MED ORDER — PREDNISONE 50 MG PO TABS
50.0000 mg | ORAL_TABLET | Freq: Four times a day (QID) | ORAL | Status: AC
  Administered 2019-10-24 – 2019-10-25 (×3): 50 mg via ORAL
  Filled 2019-10-24 (×3): qty 1

## 2019-10-24 MED ORDER — DIPHENHYDRAMINE HCL 50 MG/ML IJ SOLN: 50.0000 mg | Freq: Once | INTRAMUSCULAR | Status: AC

## 2019-10-24 MED ORDER — ACETAMINOPHEN 650 MG RE SUPP: 650.0000 mg | Freq: Four times a day (QID) | RECTAL | Status: DC | PRN

## 2019-10-24 MED ORDER — CHLORHEXIDINE GLUCONATE CLOTH 2 % EX PADS
6.0000 | MEDICATED_PAD | Freq: Every day | CUTANEOUS | Status: DC
  Administered 2019-10-25: 11:00:00 6 via TOPICAL

## 2019-10-24 MED ORDER — SODIUM CHLORIDE 0.9% FLUSH: 3.0000 mL | Freq: Two times a day (BID) | INTRAVENOUS | Status: DC

## 2019-10-24 MED ORDER — NITROGLYCERIN 0.4 MG SL SUBL
0.4000 mg | SUBLINGUAL_TABLET | SUBLINGUAL | Status: DC | PRN
  Administered 2019-10-24 – 2019-10-25 (×2): 0.4 mg via SUBLINGUAL
  Filled 2019-10-24 (×3): qty 1

## 2019-10-24 MED ORDER — HEPARIN BOLUS VIA INFUSION
4000.0000 [IU] | Freq: Once | INTRAVENOUS | Status: AC
  Administered 2019-10-24: 10:00:00 4000 [IU] via INTRAVENOUS
  Filled 2019-10-24: qty 4000

## 2019-10-24 MED ORDER — CALCIUM ACETATE (PHOS BINDER) 667 MG PO CAPS
2001.0000 mg | ORAL_CAPSULE | Freq: Three times a day (TID) | ORAL | Status: DC
  Administered 2019-10-25 – 2019-10-26 (×2): 2001 mg via ORAL
  Filled 2019-10-24 (×3): qty 3

## 2019-10-24 MED ORDER — CLOPIDOGREL BISULFATE 75 MG PO TABS
75.0000 mg | ORAL_TABLET | Freq: Every day | ORAL | Status: DC
  Administered 2019-10-24 – 2019-10-25 (×2): 75 mg via ORAL
  Filled 2019-10-24 (×2): qty 1

## 2019-10-24 MED ORDER — ASPIRIN EC 81 MG PO TBEC
81.0000 mg | DELAYED_RELEASE_TABLET | Freq: Every day | ORAL | Status: DC
  Administered 2019-10-26: 10:00:00 81 mg via ORAL
  Filled 2019-10-24 (×2): qty 1

## 2019-10-24 MED ORDER — DOXERCALCIFEROL 4 MCG/2ML IV SOLN
1.0000 ug | INTRAVENOUS | Status: DC
  Administered 2019-10-24: 17:00:00 1 ug via INTRAVENOUS
  Filled 2019-10-24: qty 2

## 2019-10-24 MED ORDER — HEPARIN (PORCINE) 25000 UT/250ML-% IV SOLN
1150.0000 [IU]/h | INTRAVENOUS | Status: DC
  Administered 2019-10-24 – 2019-10-25 (×2): 1150 [IU]/h via INTRAVENOUS
  Filled 2019-10-24 (×2): qty 250

## 2019-10-24 MED ORDER — ACETAMINOPHEN 325 MG PO TABS
650.0000 mg | ORAL_TABLET | Freq: Four times a day (QID) | ORAL | Status: DC | PRN
  Filled 2019-10-24: qty 2

## 2019-10-24 MED ORDER — SODIUM CHLORIDE 0.9% FLUSH: 3.0000 mL | INTRAVENOUS | Status: DC | PRN

## 2019-10-24 MED ORDER — SODIUM CHLORIDE 0.9% IV SOLUTION: Freq: Once | INTRAVENOUS | Status: DC

## 2019-10-24 MED ORDER — SODIUM CHLORIDE 0.9 % IV SOLN: INTRAVENOUS | Status: DC

## 2019-10-24 MED ORDER — HEPARIN SODIUM (PORCINE) 1000 UNIT/ML IJ SOLN
INTRAMUSCULAR | Status: AC
  Administered 2019-10-24: 17:00:00 4200 [IU]
  Filled 2019-10-24: qty 5

## 2019-10-24 MED ORDER — ASPIRIN 81 MG PO CHEW
81.0000 mg | CHEWABLE_TABLET | ORAL | Status: AC
  Administered 2019-10-25: 81 mg via ORAL
  Filled 2019-10-24: qty 1

## 2019-10-24 MED ORDER — DOXERCALCIFEROL 4 MCG/2ML IV SOLN
INTRAVENOUS | Status: AC
  Filled 2019-10-24: qty 2

## 2019-10-24 MED ORDER — ROSUVASTATIN CALCIUM 5 MG PO TABS
10.0000 mg | ORAL_TABLET | Freq: Every day | ORAL | Status: DC
  Administered 2019-10-24 – 2019-10-25 (×2): 10 mg via ORAL
  Filled 2019-10-24 (×2): qty 2

## 2019-10-24 MED ORDER — SODIUM CHLORIDE 0.9 % IV SOLN: 250.0000 mL | INTRAVENOUS | Status: DC | PRN

## 2019-10-24 NOTE — ED Notes (Signed)
Pt placed on 2L of oxygen for comfort. SpO2 96% on room air.

## 2019-10-24 NOTE — ED Triage Notes (Signed)
Pt BIB [facility] for chest pain and shortness of breath that worsens with exertion that has been ongoing for several months and worsening. EMS reports patients initial BP was 200/120, SpO2 in the 80's and 8/10 chest pain. Placed in NRB by EMS and pt reported that helped relieve some of his chest pain. Pt has had 2 nitro and 324 of aspirin prior to ED arrival.   Pt is a dialysis patient and does dialysis on MWF.  Pt is 97% on room air upon ED arrival.

## 2019-10-24 NOTE — ED Notes (Signed)
Per Dr. Tyrone Nine prioritize heparin infusion over blood transfusion at this time until cardiology sees patient.

## 2019-10-24 NOTE — Progress Notes (Signed)
ANTICOAGULATION CONSULT NOTE - Initial Consult  Pharmacy Consult for heparin Indication: chest pain/ACS  Allergies  Allergen Reactions  . Baclofen Other (See Comments)    Possibly stroke like symptoms  . Iodinated Diagnostic Agents Swelling, Rash and Other (See Comments)    Other Reaction: burning to mouth, swelling of lips  . Lipitor [Atorvastatin] Other (See Comments)    Leg pain  . Metoprolol Other (See Comments)    Headaches     Patient Measurements: Height: 5' 6.5" (168.9 cm) Weight: 223 lb (101.2 kg) IBW/kg (Calculated) : 64.95 Heparin Dosing Weight: 87.2kg  Vital Signs: Temp: 97.9 F (36.6 C) (03/29 0715) BP: 135/82 (03/29 0845) Pulse Rate: 103 (03/29 0845)  Labs: Recent Labs    10/24/19 0737  HGB 8.6*  HCT 27.7*  PLT 150  CREATININE 14.96*  TROPONINIHS 2,995*    Estimated Creatinine Clearance: 5.5 mL/min (A) (by C-G formula based on SCr of 14.96 mg/dL (H)).   Medical History: Past Medical History:  Diagnosis Date  . Arthritis   . Atrial flutter ([place])    Ablation 2018  . 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 Erectile dysfunction   . ESRD (end stage renal disease) on dialysis ([place])    Pt on HD 2003 >> transplanted in 2009, back on HD in 2016. [name] [facility].   . Essential hypertension   . Gout   . Hernia of abdominal wall   . History of blood transfusion   . History of cardiomyopathy   . History of kidney stones   . History of pneumonia   . Migraine   . Myocardial infarction ([place])    1996  . Pneumonia   . Secondary hyperparathyroidism ([place])   . Type 2 diabetes mellitus ([place])   . Wears glasses     Medications:  Infusions:  . heparin      Assessment: 66 yom presented to the ED with CP and SOB. Troponin elevated and now starting IV heparin. Baseline Hgb is low at 8.6  and platelets are WNL. He is not on anticoagulation PTA.   Goal of Therapy:  Heparin level 0.3-0.7 units/ml Monitor platelets by anticoagulation protocol: Yes   Plan:  Heparin bolus 4000 units IV x 1 Heparin gtt 1150 units/hr Check an 8 hr heparin level Daily heparin level and CBC  Eudell Julian, Rande Lawman 10/24/2019,9:18 AM

## 2019-10-24 NOTE — ED Notes (Signed)
Report given to dialysis RN. Dialysis informed that patient had one unit of blood ready for transfusion if it could be given while at dialysis.

## 2019-10-24 NOTE — ED Provider Notes (Signed)
[facility] EMERGENCY DEPARTMENT Provider Note   CSN: [id number] Arrival date & time: 10/24/19  5176     History Chief Complaint  Patient presents with  . Chest Pain  . Shortness of Breath    Patrick [name] is a 64 y.o. male.  64 yo M with a chief complaint of shortness of breath.  This is been an ongoing issue for him he says for at least the past 4 months.  Worsening over the past 48 hours.  Could not sleep at all last night because her time he laid back flat she became acutely short of breath.  Goes to dialysis denies any recent missed dialysis sessions.  Has had a mild cough.  Denies fevers.  Has some chest pain with this.  Described as a pressure.  Denies worsening edema.  Denies recent weight gain.  The history is provided by the patient.  Chest Pain Pain location:  L chest Pain quality: pressure   Pain radiates to:  Does not radiate Pain severity:  Moderate Onset quality:  Gradual Duration:  2 days Timing:  Constant Progression:  Worsening Chronicity:  New Context comment:  Lying flat Relieved by:  Nothing Worsened by:  Nothing Ineffective treatments:  None tried Associated symptoms: shortness of breath   Associated symptoms: no abdominal pain, no fever, no headache, no palpitations and no vomiting   Shortness of Breath Associated symptoms: chest pain   Associated symptoms: no abdominal pain, no fever, no headaches, no rash and no vomiting        Past Medical History:  Diagnosis Date  . Arthritis   . Atrial flutter ([place])    Ablation 2018  . 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 Erectile dysfunction   . ESRD (end stage renal disease) on dialysis ([place])    Pt on HD 2003 >> transplanted in 2009, back on HD in 2016. [name] TTS.   . Essential hypertension   . Gout   .  Hernia of abdominal wall   . History of blood transfusion   . History of cardiomyopathy   . History of kidney stones   . History of pneumonia   . Migraine   . Myocardial infarction ([place])    1996  . Pneumonia   . Secondary hyperparathyroidism ([place])   . Type 2 diabetes mellitus ([place])   . Wears glasses     Patient Active Problem List   Diagnosis Date Noted  . NSTEMI (non-ST elevated myocardial infarction) ([place]) 10/24/2019  . History of renal transplant 09/06/2019  . Vitamin D deficiency 09/06/2019  . Encounter for immunization 04/20/2019  . Unstable angina ([place])   . ACS (acute coronary syndrome) ([place]) 02/14/2019  . Iron deficiency anemia, unspecified 01/10/2019  . Coagulation defect, unspecified ([place]) 11/01/2018  . Complication of vascular dialysis catheter 11/01/2018  . Diarrhea, unspecified 11/01/2018  . Pain, unspecified 11/01/2018  . Pruritus, unspecified 11/01/2018  . Acute on chronic diastolic (congestive) heart failure ([place]) 10/27/2018  . SOB (shortness of breath) 10/02/2018  . Peritoneal abscess ([place]) 10/01/2018  . Nausea and vomiting 08/20/2018  . Chronic anemia 05/12/2018  . Hypokalemia 05/12/2018  . Acute combined systolic and diastolic congestive heart failure ([place]) 05/12/2018  . Hyperlipidemia 05/12/2018  . Physical deconditioning 05/12/2018  . Multifocal pneumonia 05/11/2018  . Allergy to IVP dye,  subsequent encounter   . CAD (coronary artery disease) 11/27/2017  . Atherosclerosis of native arteries of extremity with intermittent claudication ([place]) 04/03/2017  . PAD (peripheral artery disease) ([place]) 02/02/2017  . Typical atrial flutter ([place]) 09/24/2016  . PAF (paroxysmal atrial fibrillation) ([place]) 05/25/2016  . ESRD (end stage renal disease) ([place]) 05/11/2016  . Anemia of chronic disease   . End-stage renal disease on hemodialysis ([place])   . Renovascular hypertension   . History of MI (myocardial infarction)   . Acute respiratory failure with hypoxia ([place])     . Controlled diabetes mellitus type 2 with complications ([place])   . Pleural effusion   . Symptomatic anemia 01/22/2016  . Acute respiratory failure ([place]) 01/22/2016  . Acute on chronic diastolic CHF (congestive heart failure) ([place]) 01/22/2016  . Elevated troponin 01/22/2016  . Febrile illness 01/22/2016  . Activity of daily living alteration 12/06/2015  . Difficulty walking 11/29/2015  . Chest pain 11/17/2015  . Normocytic anemia 11/17/2015  . Gynecomastia 11/17/2015  . Renal stone 11/17/2015  . Cough 11/17/2015  . Dyspnea 10/30/2015  . CAP (community acquired pneumonia)   . HCAP (healthcare-associated pneumonia) 10/11/2015  . Acute respiratory disease 10/11/2015  . Protein-calorie malnutrition, severe ([place]) 09/16/2014  . 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 Metabolic acidosis, NAG, failure of bicarbonate regeneration 09/14/2014  . Heart murmur 02/24/2014  . Anemia in chronic kidney disease 11/24/2013  . 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 Hypocalcemia 11/24/2013  . Low bicarbonate 11/24/2013  . CKD (chronic kidney disease) stage 5, GFR less than 15 ml/min (HCC) 10/12/2013  . Acute kidney injury ([place]) 09/29/2013  . Immunosuppressed status ([place]) 07/07/2013  . Essential hypertension 03/19/2013  . Acute gout 03/19/2013  . Impotence of organic origin 03/19/2013  . Secondary hyperparathyroidism ([place]) 03/19/2013  . End-stage renal failure with renal transplant ([place]) 03/19/2013  . Gout 03/26/2012  . 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 A-FLUTTER ABLATION N/A 09/24/2016   Procedure: A-Flutter Ablation;  Surgeon: Will Meredith Leeds, MD;  Location: [place] [facility];  Service: Cardiovascular;  Laterality: N/A;  . ABDOMINAL AORTOGRAM W/LOWER EXTREMITY N/A 04/13/2017   Procedure: ABDOMINAL AORTOGRAM W/LOWER EXTREMITY;  Surgeon: Conrad Herbster, MD;  Location: [place] [facility];   Service: Cardiovascular;  Laterality: N/A;  Bilater lower extermity  . APPENDECTOMY    . AV FISTULA PLACEMENT Right 09/18/2014   Procedure: INSERTION OF ARTERIOVENOUS (AV) GORE-TEX GRAFT ARM USING 4-7MM  X 45CM STRETCH GORE-TEX VASCULAR GRAFT;  Surgeon: Rosetta Posner, MD;  Location: [place];  Service: Vascular;  Laterality: Right;  . AV FISTULA PLACEMENT Left 07/07/2016   Procedure: INSERTION OF LEFT BRACHIAL TO AXILLARY ARTERIOVENOUS (AV) GORE-TEX ARM GRAFT;  Surgeon: Conrad [place]tica, MD;  Location: [place];  Service: Vascular;  Laterality: Left;  . AV FISTULA PLACEMENT Left 12/09/2018   Procedure: Insertion Of Arteriovenous (Av) Gore-Tex Graft Arm, left arm;  Surgeon: Marty Heck, MD;  Location: [place];  Service: Vascular;  Laterality: Left;  . CARDIAC CATHETERIZATION  ~ 2016  . COLONOSCOPY    . 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 CORONARY ATHERECTOMY N/A 02/16/2019   Procedure: CORONARY ATHERECTOMY;  Surgeon: Martinique, Peter M, MD;  Location: [place] [facility];  Service: Cardiovascular;  Laterality: N/A;  . EXCHANGE OF A DIALYSIS CATHETER  Left 01/11/2018   Procedure: EXCHANGE OF TUNNELED DIALYSIS CATHETER;  Surgeon: Rosetta Posner, MD;  Location: [place];  Service: Vascular;  Laterality: Left;  . HERNIA REPAIR  2017   with nephrectomy  . INSERTION OF DIALYSIS CATHETER N/A 10/08/2017   Procedure: INSERTION OF TUNNELED DIALYSIS CATHETER;  Surgeon: Conrad Milton, MD;  Location: [place];  Service: Vascular;  Laterality: N/A;  . IR FLUORO GUIDE CV LINE LEFT  03/12/2018  . IR GENERIC HISTORICAL  05/11/2016   IR FLUORO GUIDE CV LINE LEFT 05/11/2016 Corrie Mckusick, DO [facility]-INTERV [facility]  . IR GENERIC HISTORICAL  05/11/2016   IR US GUIDE VASC ACCESS LEFT 05/11/2016 Corrie Mckusick, DO [facility]  . IR GENERIC HISTORICAL  05/11/2016   IR US GUIDE VASC ACCESS RIGHT 05/11/2016 Corrie Mckusick, DO [facility]  . IR GENERIC HISTORICAL   05/11/2016   IR RADIOLOGY PERIPHERAL GUIDED IV START 05/11/2016 Corrie Mckusick, DO [facility]-INTER[facility]  . KIDNEY TRANSPLANT  2009  . LEFT HEART CATH AND CORONARY ANGIOGRAPHY N/A 02/15/2019   Procedure: LEFT HEART CATH AND CORONARY ANGIOGRAPHY;  Surgeon: Martinique, Peter M, MD;  Location: [place] [facility];  Service: Cardiovascular;  Laterality: N/A;  . LEFT HEART CATH AND CORS/GRAFTS ANGIOGRAPHY N/A 12/04/2017   Procedure: LEFT HEART CATH AND CORS/GRAFTS ANGIOGRAPHY;  Surgeon: Belva Crome, MD;  Location: [place] [facility];  Service: Cardiovascular;  Laterality: N/A;  . NEPHRECTOMY  2017   transplant rejected   . PERIPHERAL VASCULAR CATHETERIZATION N/A 06/04/2016   Procedure: Upper Extremity Venography;  Surgeon: Waynetta Sandy, MD;  Location: [place] [facility];  Service: Cardiovascular;  Laterality: N/A;  . PERIPHERAL VASCULAR INTERVENTION  04/13/2017   Procedure: PERIPHERAL VASCULAR INTERVENTION;  Surgeon: Conrad [place], MD;  Location: [place] [facility];  Service: Cardiovascular;;  Lt. Common/Exernal  Iliac  . THROMBECTOMY AND REVISION OF ARTERIOVENTOUS (AV) GORETEX  GRAFT Left 10/08/2017   Procedure: THROMBECTOMY of ARTERIOVENTOUS (AV) GORETEX  GRAFT LEFT UPPER ARM;  Surgeon: Conrad [place], MD;  Location: [place];  Service: Vascular;  Laterality: Left;  . THROMBECTOMY W/ EMBOLECTOMY Left 09/14/2017   Procedure: THROMBECTOMY ARTERIOVENOUS GORE-TEX GRAFT LEFT UPPER ARM;  Surgeon: Rosetta Posner, MD;  Location: [place];  Service: Vascular;  Laterality: Left;  . UPPER EXTREMITY ANGIOGRAPHY Bilateral 10/29/2018   Procedure: UPPER EXTREMITY ANGIOGRAPHY;  Surgeon: Marty Heck, MD;  Location: [place] [facility];  Service: Cardiovascular;  Laterality: Bilateral;  . UPPER EXTREMITY VENOGRAPHY N/A 11/16/2017   Procedure: UPPER EXTREMITY VENOGRAPHY - Right Arm;  Surgeon: Conrad [place]roy, MD;  Location: [place] [facility];  Service: Cardiovascular;  Laterality: N/A;  . UPPER EXTREMITY VENOGRAPHY Bilateral  10/29/2018   Procedure: UPPER EXTREMITY VENOGRAPHY;  Surgeon: Marty Heck, MD;  Location: [place] [facility];  Service: Cardiovascular;  Laterality: Bilateral;       Family History  Problem Relation Age of Onset  . Hyperlipidemia Mother   . Hypertension Mother   . HIV Sister     Social History   Tobacco Use  . Smoking status: Former Smoker    Types: Cigarettes    Quit date: 07/04/2014    Years since quitting: 5.3  . Smokeless tobacco: Never Used  . Tobacco comment: "smoked ~ 1 pack/month when I did smoke; never a steady smoker"  Substance Use Topics  . Alcohol use: Yes    Alcohol/week: 0.0 standard drinks    Comment: rare  "2 drinks, 1-2 times/year"  .  Drug use: No    Home Medications Prior to Admission medications   Medication Sig Start Date End Date Taking? Authorizing Provider  acetaminophen (TYLENOL) 500 MG tablet Take 1,000 mg by mouth every 6 (six) hours as needed for moderate pain or headache.   Yes [provider]  albuterol (PROVENTIL HFA;VENTOLIN HFA) 108 (90 Base) MCG/ACT inhaler Inhale 1-2 puffs into the lungs every 6 (six) hours as needed for wheezing or shortness of breath. 12/18/17  Yes Richardson Dopp T, PA-C  aspirin EC 81 MG tablet Take 81 mg by mouth daily.    Yes [provider]  calcium acetate (PHOSLO) 667 MG capsule Take 1,334-2,001 mg by mouth See admin instructions. 3 capsules with dinner and 2 capsules with snacks   Yes [provider]  calcium carbonate (OS-CAL - DOSED IN MG OF ELEMENTAL CALCIUM) 1250 (500 Ca) MG tablet Take 2 tablets by mouth daily.  11/29/13  Yes [provider]  clopidogrel (PLAVIX) 75 MG tablet TAKE 1 TABLET(75 MG) BY MOUTH DAILY Patient taking differently: Take 75 mg by mouth daily.  03/03/19  Yes Richardson Dopp T, PA-C  Colchicine 0.6 MG CAPS Take 0.6 mg by mouth See admin instructions. Take 0.6 mg when first symptoms of gout flare occur, then take another 0.6 mg 14 days later as needed for gout  10/13/18  Yes [provider]  dextromethorphan (DELSYM) 30 MG/5ML liquid Take 30 mg by mouth as needed for cough.   Yes [provider]  Dextromethorphan-guaiFENesin 5-100 MG/5ML LIQD Take 15 mLs by mouth 2 (two) times daily as needed (Cough).  06/03/18  Yes [provider]  docusate sodium (COLACE) 100 MG capsule Take 100 mg by mouth daily as needed for mild constipation.   Yes [provider]  ezetimibe (ZETIA) 10 MG tablet TAKE 1 TABLET(10 MG) BY MOUTH DAILY Patient taking differently: Take 10 mg by mouth daily.  05/09/19  Yes Weaver, Scott T, PA-C  isosorbide mononitrate (IMDUR) 30 MG 24 hr tablet Take 1 tablet (30 mg total) by mouth daily. 02/24/19  Yes Daune Perch, NP  lidocaine-prilocaine (EMLA) cream Apply 1 application topically daily as needed (Numbing).  03/21/19  Yes [provider]  metoprolol succinate (TOPROL XL) 25 MG 24 hr tablet Take 1 tablet (25 mg total) by mouth daily. 05/05/19  Yes Daune Perch, NP  multivitamin (RENA-VIT) TABS tablet Take 1 tablet by mouth daily.    Yes [provider]  neomycin-bacitracin-polymyxin (NEOSPORIN) ointment Apply 1 application topically 2 (two) times daily. Apply to feet.   Yes [provider]  nitroGLYCERIN (NITROSTAT) 0.4 MG SL tablet Place 1 tablet (0.4 mg total) under the tongue every 5 (five) minutes as needed for chest pain. 09/16/19 12/15/19 Yes Fay Records, MD  repaglinide (PRANDIN) 0.5 MG tablet Take 1 tablet (0.5 mg total) by mouth 2 (two) times daily before a meal. 08/23/19  Yes Renato Shin, MD  rosuvastatin (CRESTOR) 10 MG tablet Take 1 tablet (10 mg total) by mouth daily. 02/24/19  Yes Daune Perch, NP  silver sulfADIAZINE (SILVADENE) 1 % cream Apply 1 application topically daily. Patient taking differently: Apply 1 application topically 2 (two) times daily.  10/18/19  Yes Trula Slade, DPM  traMADol (ULTRAM) 50 MG tablet Take 50 mg by mouth 2 (two) times daily  as needed for moderate pain.  05/19/18  Yes [provider]  doxycycline (VIBRA-TABS) 100 MG tablet Take 1 tablet (100 mg total) by mouth 2 (two) times daily. Patient  not taking: Reported on 10/24/2019 09/06/19   Trula Slade, DPM  Methoxy PEG-Epoetin Beta (MIRCERA IJ) Mircera 09/05/19 09/03/20  [provider]  oxyCODONE-acetaminophen (PERCOCET/ROXICET) 5-325 MG tablet Take 1 tablet by mouth every 6 (six) hours as needed. Patient not taking: Reported on 10/24/2019 12/09/18   Ulyses Amor, PA-C  pantoprazole (PROTONIX) 40 MG tablet Take 1 tablet (40 mg total) by mouth daily. Patient not taking: Reported on 10/24/2019 11/08/18   Leanor Kail, PA    Allergies    Baclofen, Iodinated diagnostic agents, Lipitor [atorvastatin], and Metoprolol  Review of Systems   Review of Systems  Constitutional: Negative for chills and fever.  HENT: Negative for congestion and facial swelling.   Eyes: Negative for discharge and visual disturbance.  Respiratory: Positive for shortness of breath.   Cardiovascular: Positive for chest pain. Negative for palpitations.  Gastrointestinal: Negative for abdominal pain, diarrhea and vomiting.  Musculoskeletal: Negative for arthralgias and myalgias.  Skin: Negative for color change and rash.  Neurological: Negative for tremors, syncope and headaches.  Psychiatric/Behavioral: Negative for confusion and dysphoric mood.    Physical Exam Updated Vital Signs BP (!) 112/45   Pulse (!) 110   Temp 97.9 F (36.6 C)   Resp (!) 32   Ht 5' 6.5" (1.689 m)   Wt 101.2 kg   SpO2 100%   BMI 35.45 kg/m   Physical Exam Vitals and nursing note reviewed.  Constitutional:      Appearance: He is well-developed. He is obese.  HENT:     Head: Normocephalic and atraumatic.  Eyes:     Pupils: Pupils are equal, round, and reactive to light.  Neck:     Vascular: No JVD.  Cardiovascular:     Rate and Rhythm: Normal rate and regular rhythm.     Heart  sounds: No murmur. No friction rub. No gallop.   Pulmonary:     Effort: Tachypnea present. No respiratory distress.     Breath sounds: No wheezing.     Comments: Diminished breath sounds in all fields Abdominal:     General: There is no distension.     Tenderness: There is no guarding or rebound.  Musculoskeletal:        General: Normal range of motion.     Cervical back: Normal range of motion and neck supple.  Skin:    Coloration: Skin is not pale.     Findings: No rash.  Neurological:     Mental Status: He is alert and oriented to person, place, and time.  Psychiatric:        Behavior: Behavior normal.     ED Results / Procedures / Treatments   Labs (all labs ordered are listed, but only abnormal results are displayed) Labs Reviewed  CBC WITH DIFFERENTIAL/PLATELET - Abnormal; Notable for the following components:      Result Value   RBC 2.74 (*)    Hemoglobin 8.6 (*)    HCT 27.7 (*)    MCV 101.1 (*)    All other components within normal limits  BASIC METABOLIC PANEL - Abnormal; Notable for the following components:   Glucose, Bld 134 (*)    BUN 48 (*)    Creatinine, Ser 14.96 (*)    GFR calc non Af Amer 3 (*)    GFR calc Af Amer 3 (*)    Anion gap 18 (*)    All other components within normal limits  TROPONIN I (HIGH SENSITIVITY) - Abnormal; Notable for the following  components:   Troponin I (High Sensitivity) 2,995 (*)    All other components within normal limits  RESPIRATORY PANEL BY RT PCR (FLU A&B, COVID)  HEPARIN LEVEL (UNFRACTIONATED)  TYPE AND SCREEN  PREPARE RBC (CROSSMATCH)    EKG EKG Interpretation  Date/Time:  Monday October 24 2019 09:15:51 EDT Ventricular Rate:  98 PR Interval:    QRS Duration: 78 QT Interval:  379 QTC Calculation: 484 R Axis:   81 Text Interpretation: Sinus rhythm Borderline right axis deviation Abnormal T, consider ischemia, lateral leads st depression seen on prior resolved Otherwise no significant change Confirmed by Deno Etienne [phone]) on 10/24/2019 10:05:29 AM   Radiology DG Chest Port 1 View  Result Date: 10/24/2019 CLINICAL DATA:  Shortness of breath. EXAM: PORTABLE CHEST 1 VIEW COMPARISON:  02/14/2019 FINDINGS: Left jugular dialysis catheter. Catheter tip in the right atrium. Cardiac silhouette is enlarged. Central vascular structures are slightly enlarged. No significant peribronchial thickening or cuffing. Again noted is a vascular stent in the right innominate vein region. Previous median sternotomy. IMPRESSION: 1. Slight enlargement of the central vascular structures. Findings may represent mild vascular congestion. No overt pulmonary edema. 2. Stable cardiomegaly. 3. Dialysis catheter. Electronically Signed   By: Markus Daft M.D.   On: 10/24/2019 08:05    Procedures Procedures (including critical care time) Procedure note: Ultrasound Guided Peripheral IV Ultrasound guided peripheral 1.88 inch angiocath IV placement performed by me. Indications: Nursing unable to place IV. Details: The antecubital fossa and upper arm were evaluated with a multifrequency linear probe. Patent brachial veins were noted. 1 attempt was made to cannulate a vein under realtime US guidance with successful cannulation of the vein and catheter placement. There is return of non-pulsatile dark red blood. The patient tolerated the procedure well without complications. Images archived electronically.  CPT codes: [phone] and [phone]  Medications Ordered in ED Medications  nitroGLYCERIN (NITROSTAT) SL tablet 0.4 mg (0.4 mg Sublingual Given 10/24/19 0940)  0.9 %  sodium chloride infusion (Manually program via Guardrails IV Fluids) (has no administration in time range)  aspirin chewable tablet 324 mg (324 mg Oral Not Given 10/24/19 0929)  heparin ADULT infusion 100 units/mL (25000 units/270mL sodium chloride 0.45%) (1,150 Units/hr Intravenous New Bag/Given 10/24/19 0943)  nitroGLYCERIN 50 mg in dextrose 5 % 250 mL (0.2 mg/mL) infusion (0 mcg/min  Intravenous Hold 10/24/19 1237)  acetaminophen (TYLENOL) tablet 650 mg (has no administration in time range)    Or  acetaminophen (TYLENOL) suppository 650 mg (has no administration in time range)  aspirin EC tablet 81 mg (has no administration in time range)  rosuvastatin (CRESTOR) tablet 10 mg (has no administration in time range)  heparin bolus via infusion 4,000 Units (4,000 Units Intravenous Bolus from Bag 10/24/19 0943)    ED Course  I have reviewed the triage vital signs and the nursing notes.  Pertinent labs & imaging results that were available during my care of the patient were reviewed by me and considered in my medical decision making (see chart for details).    MDM Rules/Calculators/A&P                      64 yo M with a chief complaints of shortness of breath.  He describes orthopnea and PND.  Likely fluid overloaded, though difficult to hear rales due to body habitus.  Patient is a end-stage renal disease patient.  States that he has been hitting his dry weight and actually going under on the most  recent sessions.  Denies any missed sessions.  Will obtain a laboratory evaluation chest x-ray reassess.  Patient's troponin is about 3000.  With his EKG changes the case was discussed with cardiology who evaluated patient at bedside.  Started on heparin.  Recommended cardiac cath but due to his significant orthopnea felt he might benefit from dialysis session today and medical stabilization.  Discussed with Dr. Royce Macadamia, nephrology will come and evaluate.  Medicine admit.  Patient also has an anemia below his baseline.  Could be the cause of some of his symptoms as well.  Ordered transfusion of 1 unit.  He denies any dark stool or blood in his stool.  Declining rectal exam.  CRITICAL CARE Performed by: Cecilio Asper   Total critical care time: 80 minutes  Critical care time was exclusive of separately billable procedures and treating other patients.  Critical care was  necessary to treat or prevent imminent or life-threatening deterioration.  Critical care was time spent personally by me on the following activities: development of treatment plan with patient and/or surrogate as well as nursing, discussions with consultants, evaluation of patient's response to treatment, examination of patient, obtaining history from patient or surrogate, ordering and performing treatments and interventions, ordering and review of laboratory studies, ordering and review of radiographic studies, pulse oximetry and re-evaluation of patient's condition.    The patients results and plan were reviewed and discussed.   Any x-rays performed were independently reviewed by myself.   Differential diagnosis were considered with the presenting HPI.  Medications  nitroGLYCERIN (NITROSTAT) SL tablet 0.4 mg (0.4 mg Sublingual Given 10/24/19 0940)  0.9 %  sodium chloride infusion (Manually program via Guardrails IV Fluids) (has no administration in time range)  aspirin chewable tablet 324 mg (324 mg Oral Not Given 10/24/19 0929)  heparin ADULT infusion 100 units/mL (25000 units/264mL sodium chloride 0.45%) (1,150 Units/hr Intravenous New Bag/Given 10/24/19 0943)  nitroGLYCERIN 50 mg in dextrose 5 % 250 mL (0.2 mg/mL) infusion (0 mcg/min Intravenous Hold 10/24/19 1237)  acetaminophen (TYLENOL) tablet 650 mg (has no administration in time range)    Or  acetaminophen (TYLENOL) suppository 650 mg (has no administration in time range)  aspirin EC tablet 81 mg (has no administration in time range)  rosuvastatin (CRESTOR) tablet 10 mg (has no administration in time range)  heparin bolus via infusion 4,000 Units (4,000 Units Intravenous Bolus from Bag 10/24/19 0943)    Vitals:   10/24/19 1115 10/24/19 1130 10/24/19 1145 10/24/19 1230  BP:   (!) 112/45   Pulse: 99 (!) 102 99 (!) 110  Resp: (!) 25 (!) 33 (!) 35 (!) 32  Temp:      SpO2: 100% 100% 100% 100%  Weight:      Height:        Final  diagnoses:  NSTEMI (non-ST elevated myocardial infarction) ([place])    Admission/ observation were discussed with the admitting physician, patient and/or family and they are comfortable with the plan.   Final Clinical Impression(s) / ED Diagnoses Final diagnoses:  NSTEMI (non-ST elevated myocardial infarction) [facility])    Rx / [place] Orders ED Discharge Orders    None       Deno Etienne, DO 10/24/19 1241

## 2019-10-24 NOTE — ED Notes (Signed)
Per blood bank patient's unit of blood is ready. Pt only has one IV at this time. Unable to start blood transfusion due to heparin infusion.

## 2019-10-24 NOTE — ED Notes (Signed)
Dr. Tyrone Nine made aware of elevated troponin of 2995 by this RN.

## 2019-10-24 NOTE — Progress Notes (Signed)
[place] for heparin Indication: chest pain/ACS  Allergies  Allergen Reactions  . Baclofen Other (See Comments)    Possibly stroke like symptoms  . Iodinated Diagnostic Agents Swelling, Rash and Other (See Comments)    Other Reaction: burning to mouth, swelling of lips  . Lipitor [Atorvastatin] Other (See Comments)    Leg pain  . Metoprolol Other (See Comments)    Headaches     Patient Measurements: Height: 5' 6.5" (168.9 cm) Weight: 207 lb 7.3 oz (94.1 kg) IBW/kg (Calculated) : 64.95 Heparin Dosing Weight: 87.2kg  Vital Signs: Temp: 97.8 F (36.6 C) (03/29 1825) Temp Source: Oral (03/29 1825) BP: 103/46 (03/29 1825) Pulse Rate: 106 (03/29 1840)  Labs: Recent Labs    10/24/19 0737 10/24/19 1911  HGB 8.6*  --   HCT 27.7*  --   PLT 150  --   HEPARINUNFRC  --  0.53  CREATININE 14.96*  --   TROPONINIHS 2,995*  --     Estimated Creatinine Clearance: 5.3 mL/min (A) (by C-G formula based on SCr of 14.96 mg/dL (H)).   Medical History: Past Medical History:  Diagnosis Date  . Arthritis   . Atrial flutter ([place])    Ablation 2018  . 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 Erectile dysfunction   . ESRD (end stage renal disease) on dialysis ([place])    Pt on HD 2003 >> transplanted in 2009, back on HD in 2016. [name].   . Essential hypertension   . Gout   . Hernia of abdominal wall   . History of blood transfusion   . History of cardiomyopathy   . History of kidney stones   . History of pneumonia   . Migraine   . Myocardial infarction ([place])    1996  . Pneumonia   . Secondary hyperparathyroidism ([place])   . Type 2 diabetes mellitus ([place])   . Wears glasses     Medications:  Infusions:  . sodium chloride    . [START ON 10/25/2019] sodium chloride     . heparin 1,150 Units/hr (10/24/19 0943)  . nitroGLYCERIN Stopped (10/24/19 1237)    Assessment: 24 yom with ESRD on HD presented to the ED with CP and SOB. Troponin elevated and now starting IV heparin. Baseline Hgb is low at 8.6 and platelets are WNL. He is not on anticoagulation PTA.  Initial heparin level therapeutic at 0.53. No active bleed issues reported. Cardiology planning LHC once volume status optimized after HD 3/30 AM.  Goal of Therapy:  Heparin level 0.3-0.7 units/ml Monitor platelets by anticoagulation protocol: Yes   Plan:  Continue heparin IV at 1150 units/hr Confirmatory heparin level with AM labs Monitor daily heparin level and CBC, s/sx bleeding Cardiology planning cath 3/30   Arturo Morton, PharmD, BCPS Please check AMION for all [place] contact numbers Clinical Pharmacist 10/24/2019 7:46 PM

## 2019-10-24 NOTE — Consult Note (Addendum)
Cardiology Consultation:   Patrick [name] MRN: [id number]; DOB: 10-03-1955  Admit date: 10/24/2019 Date of Consult: 10/24/2019  Primary Care Provider: Sonia Side., FNP Primary Cardiologist: Dorris Carnes, MD  Primary Electrophysiologist:  Will Meredith Leeds, MD    Patrick [name] Profile:   Patrick Patrick [name] is a 64 y.o. 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 Tyrone Nine.  History of Present Illness:   Patrick Patrick [name] is s/p CABG x 1 in 1997. Heart cath in 2018 with atretic LIMA-LAD and CTO LAD with left to left and left to right collaterals. CTO treated with medical therapy. Admission 10/2018 with elevated troponin felt to be in Patrick setting of hypokalemia and renal insufficiency. Cardiac cath was discussed but Patrick [name] declined. Pt was again hospitalized 02/14/2019 with angina and NSTEMI found to have high grade disease in Patrick RCA treated with atherectomy and overlapping DES x 2. DAPT x 1 year.  PT takes midodrine on HD days. Patrick Patrick [name] was seen in clinic 05/05/19 with recurrence of angina and nitro use. Anti-anginal regimen was increased by adding low dose BB given problems with hypotension. Patrick Patrick [name] continues to take nitro weekly for chest pain.   Patrick Patrick [name] presented to [facility] with SOB and chest pain x 4 months but worsening over Patrick past 48 hrs. Patrick Patrick [name] denies missing HD. Patrick Patrick [name] also reports orthopnea and LE edema. HS troponin 3000. Cardiology was consulted.   Hb also 8.6, baseline is 11-12. sCr 14.96, K 4.2  On my interview, Patrick Patrick [name] reports that shortly after his last heart cath, Patrick Patrick [name] began having chest pain on non-HD days relieved with nitro. Over Patrick past two weeks, his chest pain has increased in intensity and frequency. Patrick Patrick [name] now has chest pain every day  and it can wake him from sleep. Over Patrick past two days, his DOE has increased and CP has not required 5-6 nitro per day. CP is a pressure in his left chest that radiates down his left arm and is worse with exertion, relieved with nitro. Patrick Patrick [name] has not missed HD.   Of note, his left arm fistula has closed and Patrick Patrick [name] has not options for a fistula on his right upper extremity. Patrick Patrick [name] has HD catheter in place in his left chest.    Past Medical History:  Diagnosis Date  . Arthritis   . Atrial flutter ([place])    Ablation 2018  . 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 Erectile dysfunction   . ESRD (end stage renal disease) on dialysis ([place])    Pt on HD 2003 >> transplanted in 2009, back on HD in 2016. [name] TTS.   . Essential hypertension   . Gout   . Hernia of abdominal wall   . History of blood transfusion   . History of cardiomyopathy   . History of kidney stones   . History of pneumonia   . Migraine   . Myocardial infarction ([place])    1996  . Pneumonia   . Secondary hyperparathyroidism ([place])   . Type 2 diabetes mellitus (  [place])   . Wears glasses     Past Surgical History:  Procedure Laterality Date  . A-FLUTTER ABLATION N/A 09/24/2016   Procedure: A-Flutter Ablation;  Surgeon: Will Meredith Leeds, MD;  Location: [place] [facility];  Service: Cardiovascular;  Laterality: N/A;  . ABDOMINAL AORTOGRAM W/LOWER EXTREMITY N/A 04/13/2017   Procedure: ABDOMINAL AORTOGRAM W/LOWER EXTREMITY;  Surgeon: Conrad Capulin, MD;  Location: [place] [facility];  Service: Cardiovascular;  Laterality: N/A;  Bilater lower extermity  . APPENDECTOMY    . AV FISTULA PLACEMENT Right 09/18/2014   Procedure: INSERTION OF ARTERIOVENOUS (AV) GORE-TEX GRAFT ARM USING 4-7MM  X 45CM STRETCH GORE-TEX VASCULAR GRAFT;  Surgeon: Rosetta Posner, MD;  Location: [place];   Service: Vascular;  Laterality: Right;  . AV FISTULA PLACEMENT Left 07/07/2016   Procedure: INSERTION OF LEFT BRACHIAL TO AXILLARY ARTERIOVENOUS (AV) GORE-TEX ARM GRAFT;  Surgeon: Conrad Meadowlands, MD;  Location: [place];  Service: Vascular;  Laterality: Left;  . AV FISTULA PLACEMENT Left 12/09/2018   Procedure: Insertion Of Arteriovenous (Av) Gore-Tex Graft Arm, left arm;  Surgeon: Marty Heck, MD;  Location: [place];  Service: Vascular;  Laterality: Left;  . CARDIAC CATHETERIZATION  ~ 2016  . COLONOSCOPY    . 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 CORONARY ATHERECTOMY N/A 02/16/2019   Procedure: CORONARY ATHERECTOMY;  Surgeon: Martinique, Peter M, MD;  Location: [place] [facility];  Service: Cardiovascular;  Laterality: N/A;  . EXCHANGE OF A DIALYSIS CATHETER Left 01/11/2018   Procedure: EXCHANGE OF TUNNELED DIALYSIS CATHETER;  Surgeon: Rosetta Posner, MD;  Location: [place];  Service: Vascular;  Laterality: Left;  . HERNIA REPAIR  2017   with nephrectomy  . INSERTION OF DIALYSIS CATHETER N/A 10/08/2017   Procedure: INSERTION OF TUNNELED DIALYSIS CATHETER;  Surgeon: Conrad Mesa, MD;  Location: [place];  Service: Vascular;  Laterality: N/A;  . IR FLUORO GUIDE CV LINE LEFT  03/12/2018  . IR GENERIC HISTORICAL  05/11/2016   IR FLUORO GUIDE CV LINE LEFT 05/11/2016 Corrie Mckusick, DO [facility]-INTER[facility]  . IR GENERIC HISTORICAL  05/11/2016   IR US GUIDE VASC ACCESS LEFT 05/11/2016 Corrie Mckusick, DO [facility]  . IR GENERIC HISTORICAL  05/11/2016   IR US GUIDE VASC ACCESS RIGHT 05/11/2016 Corrie Mckusick, DO [facility]  . IR GENERIC HISTORICAL  05/11/2016   IR RADIOLOGY PERIPHERAL GUIDED IV START 05/11/2016 Corrie Mckusick, DO [facility]  . KIDNEY TRANSPLANT  2009  . LEFT HEART CATH AND CORONARY ANGIOGRAPHY N/A 02/15/2019   Procedure: LEFT HEART CATH AND CORONARY ANGIOGRAPHY;  Surgeon: Martinique, Peter M, MD;  Location: [place] [facility];  Service: Cardiovascular;  Laterality: N/A;  . LEFT HEART CATH AND CORS/GRAFTS ANGIOGRAPHY N/A 12/04/2017   Procedure: LEFT HEART CATH AND CORS/GRAFTS ANGIOGRAPHY;  Surgeon: Belva Crome, MD;  Location: [place] [facility];  Service: Cardiovascular;  Laterality: N/A;  . NEPHRECTOMY  2017   transplant rejected   . PERIPHERAL VASCULAR CATHETERIZATION N/A 06/04/2016   Procedure: Upper Extremity Venography;  Surgeon: Waynetta Sandy, MD;  Location: [place] [facility];  Service: Cardiovascular;  Laterality: N/A;  . PERIPHERAL VASCULAR INTERVENTION  04/13/2017   Procedure: PERIPHERAL VASCULAR INTERVENTION;  Surgeon: Conrad Lincolnia, MD;  Location: [place] [facility];  Service: Cardiovascular;;  Lt. Common/Exernal  Iliac  . THROMBECTOMY AND REVISION OF ARTERIOVENTOUS (AV) GORETEX  GRAFT Left  10/08/2017   Procedure: THROMBECTOMY of ARTERIOVENTOUS (AV) GORETEX  GRAFT LEFT UPPER ARM;  Surgeon: Conrad Parklawn, MD;  Location: [place];  Service: Vascular;  Laterality: Left;  . THROMBECTOMY W/ EMBOLECTOMY Left 09/14/2017   Procedure: THROMBECTOMY ARTERIOVENOUS GORE-TEX GRAFT LEFT UPPER ARM;  Surgeon: Rosetta Posner, MD;  Location: [place];  Service: Vascular;  Laterality: Left;  . UPPER EXTREMITY ANGIOGRAPHY Bilateral 10/29/2018   Procedure: UPPER EXTREMITY ANGIOGRAPHY;  Surgeon: Marty Heck, MD;  Location: [place] [facility];  Service: Cardiovascular;  Laterality: Bilateral;  . UPPER EXTREMITY VENOGRAPHY N/A 11/16/2017   Procedure: UPPER EXTREMITY VENOGRAPHY - Right Arm;  Surgeon: Conrad Clermont, MD;  Location: [place] [facility];  Service: Cardiovascular;  Laterality: N/A;  . UPPER EXTREMITY VENOGRAPHY Bilateral 10/29/2018   Procedure: UPPER EXTREMITY VENOGRAPHY;  Surgeon: Marty Heck, MD;  Location: [place] [facility];  Service: Cardiovascular;  Laterality: Bilateral;     Home Medications:  Prior to Admission medications   Medication Sig Start Date End Date Taking? Authorizing Provider    acetaminophen (TYLENOL) 500 MG tablet Take 1,000 mg by mouth every 6 (six) hours as needed for moderate pain or headache.   Yes [provider]  albuterol (PROVENTIL HFA;VENTOLIN HFA) 108 (90 Base) MCG/ACT inhaler Inhale 1-2 puffs into Patrick lungs every 6 (six) hours as needed for wheezing or shortness of breath. 12/18/17  Yes Richardson Dopp T, PA-C  aspirin EC 81 MG tablet Take 81 mg by mouth daily.    Yes [provider]  calcium acetate (PHOSLO) 667 MG capsule Take 1,334-2,001 mg by mouth See admin instructions. 3 capsules with dinner and 2 capsules with snacks   Yes [provider]  calcium carbonate (OS-CAL - DOSED IN MG OF ELEMENTAL CALCIUM) 1250 (500 Ca) MG tablet Take 2 tablets by mouth daily.  11/29/13  Yes [provider]  clopidogrel (PLAVIX) 75 MG tablet TAKE 1 TABLET(75 MG) BY MOUTH DAILY Patrick [name] taking differently: Take 75 mg by mouth daily.  03/03/19  Yes Richardson Dopp T, PA-C  Colchicine 0.6 MG CAPS Take 0.6 mg by mouth See admin instructions. Take 0.6 mg when first symptoms of gout flare occur, then take another 0.6 mg 14 days later as needed for gout 10/13/18  Yes [provider]  dextromethorphan (DELSYM) 30 MG/5ML liquid Take 30 mg by mouth as needed for cough.   Yes [provider]  Dextromethorphan-guaiFENesin 5-100 MG/5ML LIQD Take 15 mLs by mouth 2 (two) times daily as needed (Cough).  06/03/18  Yes [provider]  docusate sodium (COLACE) 100 MG capsule Take 100 mg by mouth daily as needed for mild constipation.   Yes [provider]  ezetimibe (ZETIA) 10 MG tablet TAKE 1 TABLET(10 MG) BY MOUTH DAILY Patrick [name] taking differently: Take 10 mg by mouth daily.  05/09/19  Yes Weaver, Scott T, PA-C  isosorbide mononitrate (IMDUR) 30 MG 24 hr tablet Take 1 tablet (30 mg total) by mouth daily. 02/24/19  Yes Daune Perch, NP  lidocaine-prilocaine (EMLA) cream Apply 1 application topically daily as needed (Numbing).   03/21/19  Yes [provider]  metoprolol succinate (TOPROL XL) 25 MG 24 hr tablet Take 1 tablet (25 mg total) by mouth daily. 05/05/19  Yes Daune Perch, NP  multivitamin (RENA-VIT) TABS tablet Take 1 tablet by mouth daily.    Yes [provider]  neomycin-bacitracin-polymyxin (NEOSPORIN) ointment Apply 1 application topically 2 (two) times daily. Apply to feet.   Yes [provider]  nitroGLYCERIN (NITROSTAT) 0.4 MG SL tablet Place 1 tablet (0.4 mg total) under Patrick tongue every 5 (five) minutes as needed for chest pain. 09/16/19 12/15/19 Yes Fay Records, MD  repaglinide (PRANDIN) 0.5 MG tablet Take 1 tablet (0.5 mg total) by mouth 2 (two) times daily before a meal. 08/23/19  Yes Renato Shin, MD  rosuvastatin (CRESTOR) 10 MG tablet Take 1 tablet (10 mg total) by mouth daily. 02/24/19  Yes Daune Perch, NP  silver sulfADIAZINE (SILVADENE) 1 % cream Apply 1 application topically daily. Patrick [name] taking differently: Apply 1 application topically 2 (two) times daily.  10/18/19  Yes Trula Slade, DPM  traMADol (ULTRAM) 50 MG tablet Take 50 mg by mouth 2 (two) times daily as needed for moderate pain.  05/19/18  Yes [provider]  doxycycline (VIBRA-TABS) 100 MG tablet Take 1 tablet (100 mg total) by mouth 2 (two) times daily. Patrick [name] not taking: Reported on 10/24/2019 09/06/19   Trula Slade, DPM  Methoxy PEG-Epoetin Beta (MIRCERA IJ) Mircera 09/05/19 09/03/20  [provider]  oxyCODONE-acetaminophen (PERCOCET/ROXICET) 5-325 MG tablet Take 1 tablet by mouth every 6 (six) hours as needed. Patrick [name] not taking: Reported on 10/24/2019 12/09/18   Ulyses Amor, PA-C  pantoprazole (PROTONIX) 40 MG tablet Take 1 tablet (40 mg total) by mouth daily. Patrick [name] not taking: Reported on 10/24/2019 11/08/18   Leanor Kail, PA    Inpatient Medications: Scheduled Meds: . sodium chloride   Intravenous Once  . aspirin  324 mg Oral Once   Continuous  Infusions: . heparin 1,150 Units/hr (10/24/19 0943)   PRN Meds: nitroGLYCERIN  Allergies:    Allergies  Allergen Reactions  . Baclofen Other (See Comments)    Possibly stroke like symptoms  . Iodinated Diagnostic Agents Swelling, Rash and Other (See Comments)    Other Reaction: burning to mouth, swelling of lips  . Lipitor [Atorvastatin] Other (See Comments)    Leg pain  . Metoprolol Other (See Comments)    Headaches     Social History:   Social History   Socioeconomic History  . Marital status: Married    Spouse name: SHEILA  . Number of children: 5  . Years of education: 68  . Highest education level: 12th grade  Occupational History  . Occupation: DISABLED  Tobacco Use  . Smoking status: Former Smoker    Types: Cigarettes    Quit date: 07/04/2014    Years since quitting: 5.3  . Smokeless tobacco: Never Used  . Tobacco comment: "smoked ~ 1 pack/month when I did smoke; never a steady smoker"  Substance and Sexual Activity  . Alcohol use: Yes    Alcohol/week: 0.0 standard drinks    Comment: rare  "2 drinks, 1-2 times/year"  . Drug use: No  . Sexual activity: Not Currently    Birth control/protection: None  Other Topics Concern  . Not on file  Social History Narrative  . Not on file   Social Determinants of Health   Financial Resource Strain:   . Difficulty of Paying Living Expenses:   Food Insecurity:   . Worried About Charity fundraiser in Patrick Last Year:   . Arboriculturist in [place] Last Year:   Transportation Needs:   . Film/video editor (Medical):   Marland Kitchen Lack of Transportation (Non-Medical):   Physical Activity:   . Days of Exercise per Week:   . Minutes of Exercise per Session:   Stress:   . Feeling of  Stress :   Social Connections:   . Frequency of Communication with Friends and Family:   . Frequency of Social Gatherings with Friends and Family:   . Attends Religious Services:   . Active Member of Clubs or Organizations:   . Attends Theatre manager Meetings:   Marland Kitchen Marital Status:   Intimate Partner Violence:   . Fear of Current or Ex-Partner:   . Emotionally Abused:   Marland Kitchen Physically Abused:   . Sexually Abused:     Family History:    Family History  Problem Relation Age of Onset  . Hyperlipidemia Mother   . Hypertension Mother   . HIV Sister      ROS:  Please see Patrick history of present illness.   All other ROS reviewed and negative.     Physical Exam/Data:   Vitals:   10/24/19 0930 10/24/19 0945 10/24/19 1000 10/24/19 1015  BP: (!) 127/107 (!) 93/59 106/71 (!) 111/39  Pulse: (!) 101 99 (!) 108 97  Resp: (!) 32 (!) 28 (!) 28 (!) 39  Temp:      SpO2: 100% 100% 100% 100%  Weight:      Height:       No intake or output data in Patrick 24 hours ending 10/24/19 1100 Last 3 Weights 10/24/2019 07/14/2019 07/04/2019  Weight (lbs) 223 lb 220 lb 219 lb  Weight (kg) 101.152 kg 99.791 kg 99.338 kg     Body mass index is 35.45 kg/m.  General:  Well nourished, well developed, in no acute distress HEENT: normal Lymph: no adenopathy Neck: no JVD Endocrine:  No thryomegaly Vascular: No carotid bruits; FA pulses 2+ bilaterally without bruits  Cardiac:  normal S1, S2; RRR; no murmur  Lungs:  Respirations rapid, nonproductive cough, diminished in bases Abd: soft, nontender, no hepatomegaly  Ext: no edema Musculoskeletal:  No deformities, BUE and BLE strength normal and equal Skin: warm and dry  Neuro:  CNs 2-12 intact, no focal abnormalities noted Psych:  Normal affect   EKG:  Patrick EKG was personally reviewed and demonstrates:  Sinus tachycardia HR 119 repolarization abnormality, T wave abnormality  Telemetry:  Telemetry was personally reviewed and demonstrates:  Sinus to sinus tachycardia with HR 80-100s  Relevant CV Studies:  Left heart cath 02/15/19:  Prox LAD lesion is 100% stenosed.  Ost 1st Diag lesion is 50% stenosed.  Ost 1st Mrg to 1st Mrg lesion is 50% stenosed.  Prox RCA to Mid RCA lesion is 95%  stenosed.  Mid RCA to Dist RCA lesion is 65% stenosed.  There is no aortic valve stenosis.  LV end diastolic pressure is normal.   1. 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 Occlusion of Patrick mid LAD- CTO 3. High grade obstructive disease in Patrick mid RCA. Heavily calcified 4. Known atretic LIMA 5. Low LV filling pressures  Plan: continue DAPT. Will bring back to Patrick cath lab tomorrow for atherectomy and stenting of Patrick RCA.    Coronary atherectomy 02/16/19:  Mid RCA to Dist RCA lesion is 65% stenosed.  A drug-eluting stent was successfully placed using a STENT SYNERGY DES [id number].  Prox RCA to Mid RCA lesion is 95% stenosed.  A drug-eluting stent was successfully placed using a STENT SYNERGY DES [id number].  Post intervention, there is a 0% residual stenosis.  Post intervention, there is a 0% residual stenosis.   1. Successful PCI of Patrick mid to distal RCA using orbital atherectomy and stenting with overlapping  DES x 2.   Plan: DAPT for at least one year. Anticipate DC tomorrow.   Diagnostic Dominance: Right  Intervention     Laboratory Data:  High Sensitivity Troponin:   Recent Labs  Lab 10/24/19 0737  TROPONINIHS 2,995*     Chemistry Recent Labs  Lab 10/24/19 0737  NA 138  K 4.2  CL 98  CO2 22  GLUCOSE 134*  BUN 48*  CREATININE 14.96*  CALCIUM 9.5  GFRNONAA 3*  GFRAA 3*  ANIONGAP 18*    No results for input(s): PROT, ALBUMIN, AST, ALT, ALKPHOS, BILITOT in Patrick last 168 hours. Hematology Recent Labs  Lab 10/24/19 0737  WBC 9.3  RBC 2.74*  HGB 8.6*  HCT 27.7*  MCV 101.1*  MCH 31.4  MCHC 31.0  RDW 15.4  PLT 150   BNPNo results for input(s): BNP, PROBNP in Patrick last 168 hours.  DDimer No results for input(s): DDIMER in Patrick last 168 hours.   Radiology/Studies:  DG Chest Port 1 View  Result Date: 10/24/2019 CLINICAL DATA:  Shortness of breath. EXAM: PORTABLE CHEST 1 VIEW COMPARISON:  02/14/2019 FINDINGS: Left jugular dialysis  catheter. Catheter tip in Patrick right atrium. Cardiac silhouette is enlarged. Central vascular structures are slightly enlarged. No significant peribronchial thickening or cuffing. Again noted is a vascular stent in Patrick right innominate vein region. Previous median sternotomy. IMPRESSION: 1. Slight enlargement of Patrick central vascular structures. Findings may represent mild vascular congestion. No overt pulmonary edema. 2. Stable cardiomegaly. 3. Dialysis catheter. Electronically Signed   By: Markus Daft M.D.   On: 10/24/2019 08:05       TIMI Risk Score for Unstable Angina or Non-ST Elevation MI:   Patrick Patrick [name]'s TIMI risk score is 6, which indicates a 41% risk of all cause mortality, new or recurrent myocardial infarction or need for urgent revascularization in Patrick next 14 days.   Assessment and Plan:   1. Chest pain, DOE 2. CAD 3. Elevated troponin - last heart cath 01/2019 with CTO in Patrick LAD treated medically, 95% followed by 65% RCA stenosis treated with orbital atherectomy and overlapping DES x 2 - placed on DAPT x 1 year - reports symptoms concerning for accelerated unstable angina - hs troponin 3000 - will plan for heart cath today via right groin - start heparin drip and nitro drip - on imdur 30 mg at home, no longer on midodrine on HD days - may tolerate an increased dose of imdur on non-HD days at discharge - continue ASA and plavix - Patrick Patrick [name] needs heart cath once Patrick Patrick [name] can lay flat and breathing more at baseline - will plan for HD today and heart cath tomorrow - will need premedication for dye allergy   4. ESRD on HD (MWF) - has not missed HD session - sCr today almost 15, K WNL - will defer to nephrology for timing of HD, but will plan for heart cath tomorrow after HD so that Patrick Patrick [name] can lay flat for angiography   5. Chronic systolic and diastolic heart failure - volume appears stable - per HD   6. Hyperlipidemia 02/15/2019: Cholesterol 122; HDL 46; LDL Cholesterol 59; Triglycerides 85;  VLDL 17 Continue crestor   7. Anemia - likely of chronic disease, Patrick Patrick [name] denies active bleeding - baseline Hb 11-12, now 8.6 - given his renal disease and CAD with underlying CHF, will likely not tolerate significant anemia - defer to primary       For questions or updates, please contact [facility] Please  consult [email or web address] for contact info under     Signed, Ledora Bottcher, PA  10/24/2019 11:00 AM   Patrick [name] seen and examined  I agree with findings as noted above by Doreene Adas.  Patrick Patrick [name] is known to me from clinic.  I last saw him in December 2020.  Patrick Patrick [name] has a history of coronary artery disease.  As noted above last cardiac catheterization was back in July 2020.  Patrick Patrick [name] had an NSTEMI with high-grade disease in Patrick RCA that was treated with atherectomy and overlapping stent x2.  I saw him in clinic and in Patrick winter Patrick Patrick [name] said actually Patrick Patrick [name] felt better for about a month and then Patrick shortness of breath came back.  In December Patrick Patrick [name] was having few episodes of chest pain per week that required nitroglycerin.  Since that time Patrick Patrick [name] is continue to have chest discomfort that has been progressive Patrick Patrick [name] is now having chest discomfort every day (both on days of dialysis and days without dialysis).  Patrick past 48 hours has been difficult.  Today Patrick Patrick [name] presented with shortness of breath.  Also with chest discomfort.  After nitroglycerin Patrick Patrick [name] is now pain-free but still short of breath Patrick Patrick [name] thinks Patrick Patrick [name] can lay back some but not completely flat.  On examination today Patrick [name] appears a little short of breath sitting in in bed.  Neck is without JVD.  Lungs with decreased breath sounds at Patrick bases.  Cardiac exam regular rate and rhythm S1, S2.  No significant murmurs.  Abdomen is nontender.  Benign none.  No masses.  Extremities are without lower extremity edema  Initial EKG shows sinus tachycardia 119 bpm.  ST depression in Patrick inferolateral leads.  Labs significant for troponin which is positive at 2995.  With this  presentation of an NSTEMI and progressive anginal symptoms for several weeks I would recommend a left heart catheterization to redefine his anatomy.  Patrick Patrick [name] is allergic to contrast media plan therefore for pretreatment with steroids and H2 blockers.  We will plan on doing this in Patrick morning.  Hopefully today Patrick Patrick [name] can be dialyzed to improve his breathing.  Renal to see Patrick Patrick [name].  Would continue other meds.  Continue Crestor.    Dorris Carnes, MD, [facility]

## 2019-10-24 NOTE — ED Notes (Signed)
Pt is currently chest pain free. Per Dr. Shan Levans hold off on IV nitro unless patient is having active chest pain.

## 2019-10-24 NOTE — Consult Note (Signed)
[facility] Renal Consultation Note    Indication for Consultation:  Management of ESRD/hemodialysis; anemia, hypertension/volume and secondary hyperparathyroidism  HPI: Patrick [name] is a 64 y.o. male with ESRD on HD, HTN, DMT2, AFlutter s/p ablation CAD s/p CABG 1997, NSTEMI, PAD, gout, failed KTx being admitted for chest pain evaluation.   Presented to the ED with worsening chest pain and DOE. Chest pain becoming more frequent. CXR w/o overt pulm edema. EKG ischemic changes. Labs significant for HS troponin 2995. He was evaluated by cardiology with plans for catheretization, but he is unable to lay flat d/t orthopnea.   Seen and examined in ED. Sitting up in bed. Chest pain controlled at present, on heparin gtt. SOB on 2L [place]. Doesn't usually have much of appetite. No f,c,n,v,d.   Dialyzes at [facility] MWF. Dialyzes via [name]. He has not missed any dialysis and is completing full treatments. He has been leaving dialysis slightly below his dry weight with minimal UF. Post HD weight on 3/26 was 99.4kg with 0.9 net.   Past Medical History:  Diagnosis Date  . Arthritis   . Atrial flutter (Patrick [name])    Ablation 2018  . 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 Erectile dysfunction   . ESRD (end stage renal disease) on dialysis (Patrick [name])    Pt on HD 2003 >> transplanted in 2009, back on HD in 2016. [name] TTS.   . Essential hypertension   . Gout   . Hernia of abdominal wall   . History of blood transfusion   . History of cardiomyopathy   . History of kidney stones   . History of pneumonia   . Migraine   . Myocardial infarction (Patrick [name])    1996  . Pneumonia   . Secondary hyperparathyroidism (Patrick [name])   . Type 2 diabetes mellitus (Patrick [name])   . Wears glasses    Past Surgical History:  Procedure  Laterality Date  . A-FLUTTER ABLATION N/A 09/24/2016   Procedure: A-Flutter Ablation;  Surgeon: Will Meredith Leeds, MD;  Location: [place] [facility];  Service: Cardiovascular;  Laterality: N/A;  . ABDOMINAL AORTOGRAM W/LOWER EXTREMITY N/A 04/13/2017   Procedure: ABDOMINAL AORTOGRAM W/LOWER EXTREMITY;  Surgeon: Patrick Oxford, MD;  Location: [place] [facility];  Service: Cardiovascular;  Laterality: N/A;  Bilater lower extermity  . APPENDECTOMY    . AV FISTULA PLACEMENT Right 09/18/2014   Procedure: INSERTION OF ARTERIOVENOUS (AV) GORE-TEX GRAFT ARM USING 4-7MM  X 45CM STRETCH GORE-TEX VASCULAR GRAFT;  Surgeon: Rosetta Posner, MD;  Location: [place];  Service: Vascular;  Laterality: Right;  . AV FISTULA PLACEMENT Left 07/07/2016   Procedure: INSERTION OF LEFT BRACHIAL TO AXILLARY ARTERIOVENOUS (AV) GORE-TEX ARM GRAFT;  Surgeon: Patrick [place], MD;  Location: [place];  Service: Vascular;  Laterality: Left;  . AV FISTULA PLACEMENT Left 12/09/2018   Procedure: Insertion Of Arteriovenous (Av) Gore-Tex Graft Arm, left arm;  Surgeon: Marty Heck, MD;  Location: [place];  Service: Vascular;  Laterality: Left;  . CARDIAC CATHETERIZATION  ~ 2016  . COLONOSCOPY    . 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 CORONARY ATHERECTOMY N/A 02/16/2019   Procedure: CORONARY ATHERECTOMY;  Surgeon: Martinique, Peter M, MD;  Location: [name]  CV LAB;  Service: Cardiovascular;  Laterality: N/A;  . EXCHANGE OF A DIALYSIS CATHETER Left 01/11/2018   Procedure: EXCHANGE OF TUNNELED DIALYSIS CATHETER;  Surgeon: Rosetta Posner, MD;  Location: [place];  Service: Vascular;  Laterality: Left;  . HERNIA REPAIR  2017   with nephrectomy  . INSERTION OF DIALYSIS CATHETER N/A 10/08/2017   Procedure: INSERTION OF TUNNELED DIALYSIS CATHETER;  Surgeon: Patrick Wolsey, MD;  Location: [place];  Service: Vascular;  Laterality: N/A;  . IR FLUORO GUIDE CV LINE LEFT  03/12/2018   . IR GENERIC HISTORICAL  05/11/2016   IR FLUORO GUIDE CV LINE LEFT 05/11/2016 Corrie Mckusick, DO [facility]-INTERV [facility]  . IR GENERIC HISTORICAL  05/11/2016   IR US GUIDE VASC ACCESS LEFT 05/11/2016 Corrie Mckusick, DO [facility]  . IR GENERIC HISTORICAL  05/11/2016   IR US GUIDE VASC ACCESS RIGHT 05/11/2016 Corrie Mckusick, DO [facility]  . IR GENERIC HISTORICAL  05/11/2016   IR RADIOLOGY PERIPHERAL GUIDED IV START 05/11/2016 Corrie Mckusick, DO [facility]-INTER[facility]  . KIDNEY TRANSPLANT  2009  . LEFT HEART CATH AND CORONARY ANGIOGRAPHY N/A 02/15/2019   Procedure: LEFT HEART CATH AND CORONARY ANGIOGRAPHY;  Surgeon: Martinique, Peter M, MD;  Location: [place] [facility];  Service: Cardiovascular;  Laterality: N/A;  . LEFT HEART CATH AND CORS/GRAFTS ANGIOGRAPHY N/A 12/04/2017   Procedure: LEFT HEART CATH AND CORS/GRAFTS ANGIOGRAPHY;  Surgeon: Patrick Crome, MD;  Location: [place] [facility];  Service: Cardiovascular;  Laterality: N/A;  . NEPHRECTOMY  2017   transplant rejected   . PERIPHERAL VASCULAR CATHETERIZATION N/A 06/04/2016   Procedure: Upper Extremity Venography;  Surgeon: Patrick Sandy, MD;  Location: [place] [facility];  Service: Cardiovascular;  Laterality: N/A;  . PERIPHERAL VASCULAR INTERVENTION  04/13/2017   Procedure: PERIPHERAL VASCULAR INTERVENTION;  Surgeon: Patrick [place], MD;  Location: [place] [facility];  Service: Cardiovascular;;  Lt. Common/Exernal  Iliac  . THROMBECTOMY AND REVISION OF ARTERIOVENTOUS (AV) GORETEX  GRAFT Left 10/08/2017   Procedure: THROMBECTOMY of ARTERIOVENTOUS (AV) GORETEX  GRAFT LEFT UPPER ARM;  Surgeon: Patrick [place], MD;  Location: [place];  Service: Vascular;  Laterality: Left;  . THROMBECTOMY W/ EMBOLECTOMY Left 09/14/2017   Procedure: THROMBECTOMY ARTERIOVENOUS GORE-TEX GRAFT LEFT UPPER ARM;  Surgeon: Rosetta Posner, MD;  Location: [place];  Service: Vascular;  Laterality: Left;  . UPPER EXTREMITY ANGIOGRAPHY Bilateral 10/29/2018   Procedure: UPPER EXTREMITY  ANGIOGRAPHY;  Surgeon: Marty Heck, MD;  Location: [place] [facility];  Service: Cardiovascular;  Laterality: Bilateral;  . UPPER EXTREMITY VENOGRAPHY N/A 11/16/2017   Procedure: UPPER EXTREMITY VENOGRAPHY - Right Arm;  Surgeon: Patrick , MD;  Location: [place] [facility];  Service: Cardiovascular;  Laterality: N/A;  . UPPER EXTREMITY VENOGRAPHY Bilateral 10/29/2018   Procedure: UPPER EXTREMITY VENOGRAPHY;  Surgeon: Marty Heck, MD;  Location: [place] [facility];  Service: Cardiovascular;  Laterality: Bilateral;   Family History  Problem Relation Age of Onset  . Hyperlipidemia Mother   . Hypertension Mother   . HIV Sister    Social History:  reports that he quit smoking about 5 years ago. His smoking use included cigarettes. He has never used smokeless tobacco. He reports current alcohol use. He reports that he does not use drugs. Allergies  Allergen Reactions  . Baclofen Other (See Comments)    Possibly stroke like symptoms  . Iodinated Diagnostic Agents Swelling, Rash and Other (See Comments)    Other Reaction:  burning to mouth, swelling of lips  . Lipitor [Atorvastatin] Other (See Comments)    Leg pain  . Metoprolol Other (See Comments)    Headaches    Prior to Admission medications   Medication Sig Start Date End Date Taking? Authorizing Provider  acetaminophen (TYLENOL) 500 MG tablet Take 1,000 mg by mouth every 6 (six) hours as needed for moderate pain or headache.   Yes [provider]  albuterol (PROVENTIL HFA;VENTOLIN HFA) 108 (90 Base) MCG/ACT inhaler Inhale 1-2 puffs into the lungs every 6 (six) hours as needed for wheezing or shortness of breath. 12/18/17  Yes Richardson Dopp T, PA-C  aspirin EC 81 MG tablet Take 81 mg by mouth daily.    Yes [provider]  calcium acetate (PHOSLO) 667 MG capsule Take 1,334-2,001 mg by mouth See admin instructions. 3 capsules with dinner and 2 capsules with snacks   Yes [provider]  calcium  carbonate (OS-CAL - DOSED IN MG OF ELEMENTAL CALCIUM) 1250 (500 Ca) MG tablet Take 2 tablets by mouth daily.  11/29/13  Yes [provider]  clopidogrel (PLAVIX) 75 MG tablet TAKE 1 TABLET(75 MG) BY MOUTH DAILY Patient taking differently: Take 75 mg by mouth daily.  03/03/19  Yes Richardson Dopp T, PA-C  Colchicine 0.6 MG CAPS Take 0.6 mg by mouth See admin instructions. Take 0.6 mg when first symptoms of gout flare occur, then take another 0.6 mg 14 days later as needed for gout 10/13/18  Yes [provider]  dextromethorphan (DELSYM) 30 MG/5ML liquid Take 30 mg by mouth as needed for cough.   Yes [provider]  Dextromethorphan-guaiFENesin 5-100 MG/5ML LIQD Take 15 mLs by mouth 2 (two) times daily as needed (Cough).  06/03/18  Yes [provider]  docusate sodium (COLACE) 100 MG capsule Take 100 mg by mouth daily as needed for mild constipation.   Yes [provider]  ezetimibe (ZETIA) 10 MG tablet TAKE 1 TABLET(10 MG) BY MOUTH DAILY Patient taking differently: Take 10 mg by mouth daily.  05/09/19  Yes Weaver, Scott T, PA-C  isosorbide mononitrate (IMDUR) 30 MG 24 hr tablet Take 1 tablet (30 mg total) by mouth daily. 02/24/19  Yes Daune Perch, NP  lidocaine-prilocaine (EMLA) cream Apply 1 application topically daily as needed (Numbing).  03/21/19  Yes [provider]  metoprolol succinate (TOPROL XL) 25 MG 24 hr tablet Take 1 tablet (25 mg total) by mouth daily. 05/05/19  Yes Daune Perch, NP  multivitamin (RENA-VIT) TABS tablet Take 1 tablet by mouth daily.    Yes [provider]  neomycin-bacitracin-polymyxin (NEOSPORIN) ointment Apply 1 application topically 2 (two) times daily. Apply to feet.   Yes [provider]  nitroGLYCERIN (NITROSTAT) 0.4 MG SL tablet Place 1 tablet (0.4 mg total) under the tongue every 5 (five) minutes as needed for chest pain. 09/16/19 12/15/19 Yes Fay Records, MD  repaglinide (PRANDIN) 0.5 MG tablet  Take 1 tablet (0.5 mg total) by mouth 2 (two) times daily before a meal. 08/23/19  Yes Renato Shin, MD  rosuvastatin (CRESTOR) 10 MG tablet Take 1 tablet (10 mg total) by mouth daily. 02/24/19  Yes Daune Perch, NP  silver sulfADIAZINE (SILVADENE) 1 % cream Apply 1 application topically daily. Patient taking differently: Apply 1 application topically 2 (two) times daily.  10/18/19  Yes Trula Slade, DPM  traMADol (ULTRAM) 50 MG tablet Take 50 mg by mouth 2 (two) times daily as needed for moderate pain.  05/19/18  Yes [provider]  doxycycline (VIBRA-TABS) 100 MG tablet Take 1 tablet (100 mg total) by mouth 2 (two) times daily. Patient not taking: Reported on 10/24/2019 09/06/19   Trula Slade, DPM  Methoxy PEG-Epoetin Beta (MIRCERA IJ) Mircera 09/05/19 09/03/20  [provider]  oxyCODONE-acetaminophen (PERCOCET/ROXICET) 5-325 MG tablet Take 1 tablet by mouth every 6 (six) hours as needed. Patient not taking: Reported on 10/24/2019 12/09/18   Ulyses Amor, PA-C  pantoprazole (PROTONIX) 40 MG tablet Take 1 tablet (40 mg total) by mouth daily. Patient not taking: Reported on 10/24/2019 11/08/18   Leanor Kail, PA   Current Facility-Administered Medications  Medication Dose Route Frequency Provider Last Rate Last Admin  . 0.9 %  sodium chloride infusion (Manually program via Guardrails IV Fluids)   Intravenous Once Deno Etienne, DO      . aspirin chewable tablet 324 mg  324 mg Oral Once Deno Etienne, DO      . heparin ADULT infusion 100 units/mL (25000 units/217mL sodium chloride 0.45%)  1,150 Units/hr Intravenous Continuous Rumbarger, Rachel L, RPH 11.5 mL/hr at 10/24/19 0943 1,150 Units/hr at 10/24/19 0943  . nitroGLYCERIN (NITROSTAT) SL tablet 0.4 mg  0.4 mg Sublingual Q5 min PRN Deno Etienne, DO   0.4 mg at 10/24/19 0940  . nitroGLYCERIN 50 mg in dextrose 5 % 250 mL (0.2 mg/mL) infusion  0-200 mcg/min Intravenous Continuous Deno Etienne, DO       Current Outpatient  Medications  Medication Sig Dispense Refill  . acetaminophen (TYLENOL) 500 MG tablet Take 1,000 mg by mouth every 6 (six) hours as needed for moderate pain or headache.    . albuterol (PROVENTIL HFA;VENTOLIN HFA) 108 (90 Base) MCG/ACT inhaler Inhale 1-2 puffs into the lungs every 6 (six) hours as needed for wheezing or shortness of breath. 1 Inhaler 0  . aspirin EC 81 MG tablet Take 81 mg by mouth daily.     . calcium acetate (PHOSLO) 667 MG capsule Take 1,334-2,001 mg by mouth See admin instructions. 3 capsules with dinner and 2 capsules with snacks    . calcium carbonate (OS-CAL - DOSED IN MG OF ELEMENTAL CALCIUM) 1250 (500 Ca) MG tablet Take 2 tablets by mouth daily.     . clopidogrel (PLAVIX) 75 MG tablet TAKE 1 TABLET(75 MG) BY MOUTH DAILY (Patient taking differently: Take 75 mg by mouth daily. ) 90 tablet 3  . Colchicine 0.6 MG CAPS Take 0.6 mg by mouth See admin instructions. Take 0.6 mg when first symptoms of gout flare occur, then take another 0.6 mg 14 days later as needed for gout    . dextromethorphan (DELSYM) 30 MG/5ML liquid Take 30 mg by mouth as needed for cough.    . Dextromethorphan-guaiFENesin 5-100 MG/5ML LIQD Take 15 mLs by mouth 2 (two) times daily as needed (Cough).     . docusate sodium (COLACE) 100 MG capsule Take 100 mg by mouth daily as needed for mild constipation.    Marland Kitchen ezetimibe (ZETIA) 10 MG tablet TAKE 1 TABLET(10 MG) BY MOUTH DAILY (Patient taking differently: Take 10 mg by mouth daily. ) 90 tablet 3  . isosorbide mononitrate (IMDUR) 30 MG 24 hr tablet Take 1 tablet (30 mg total) by mouth daily. 90 tablet 3  . lidocaine-prilocaine (EMLA) cream Apply 1 application topically daily as needed (Numbing).     . metoprolol succinate (TOPROL XL) 25 MG 24 hr tablet Take 1 tablet (25 mg total) by mouth daily. 90 tablet 3  . multivitamin (RENA-VIT)  TABS tablet Take 1 tablet by mouth daily.     Marland Kitchen neomycin-bacitracin-polymyxin (NEOSPORIN) ointment Apply 1 application topically 2  (two) times daily. Apply to feet.    . nitroGLYCERIN (NITROSTAT) 0.4 MG SL tablet Place 1 tablet (0.4 mg total) under the tongue every 5 (five) minutes as needed for chest pain. 25 tablet 6  . repaglinide (PRANDIN) 0.5 MG tablet Take 1 tablet (0.5 mg total) by mouth 2 (two) times daily before a meal. 60 tablet 4  . rosuvastatin (CRESTOR) 10 MG tablet Take 1 tablet (10 mg total) by mouth daily. 90 tablet 3  . silver sulfADIAZINE (SILVADENE) 1 % cream Apply 1 application topically daily. (Patient taking differently: Apply 1 application topically 2 (two) times daily. ) 50 g 0  . traMADol (ULTRAM) 50 MG tablet Take 50 mg by mouth 2 (two) times daily as needed for moderate pain.     Marland Kitchen doxycycline (VIBRA-TABS) 100 MG tablet Take 1 tablet (100 mg total) by mouth 2 (two) times daily. (Patient not taking: Reported on 10/24/2019) 20 tablet 0  . Methoxy PEG-Epoetin Beta (MIRCERA IJ) Mircera    . oxyCODONE-acetaminophen (PERCOCET/ROXICET) 5-325 MG tablet Take 1 tablet by mouth every 6 (six) hours as needed. (Patient not taking: Reported on 10/24/2019) 12 tablet 0  . pantoprazole (PROTONIX) 40 MG tablet Take 1 tablet (40 mg total) by mouth daily. (Patient not taking: Reported on 10/24/2019) 90 tablet 3     ROS: As per HPI otherwise negative.  Physical Exam: Vitals:   10/24/19 1100 10/24/19 1115 10/24/19 1130 10/24/19 1145  BP:    (!) 112/45  Pulse: 100 99 (!) 102 99  Resp: 19 (!) 25 (!) 33 (!) 35  Temp:      SpO2: 100% 100% 100% 100%  Weight:      Height:         General: Adult male sitting up, with nasal oxygen, nad  Head: NCAT sclera not icteric MMM Neck: Supple. No JVD appreciated  Lungs: Increased WOB, Faint crackles throughout R side  Heart: Tachy rhythm;  Abdomen: soft non-tender, non-distended  Lower extremities:without edema or ischemic changes, no open wounds  Neuro: A & O  X 3. Moves all extremities spontaneously. Psych:  Responds to questions appropriately with a normal affect. Dialysis  Access: L IJ TDC; LUE AVG clotted, not in use   Labs: Basic Metabolic Panel: Recent Labs  Lab 10/24/19 0737  NA 138  K 4.2  CL 98  CO2 22  GLUCOSE 134*  BUN 48*  CREATININE 14.96*  CALCIUM 9.5   Liver Function Tests: No results for input(s): AST, ALT, ALKPHOS, BILITOT, PROT, ALBUMIN in the last 168 hours. No results for input(s): LIPASE, AMYLASE in the last 168 hours. No results for input(s): AMMONIA in the last 168 hours. CBC: Recent Labs  Lab 10/24/19 0737  WBC 9.3  NEUTROABS 7.3  HGB 8.6*  HCT 27.7*  MCV 101.1*  PLT 150   Cardiac Enzymes: No results for input(s): CKTOTAL, CKMB, CKMBINDEX, TROPONINI in the last 168 hours. CBG: No results for input(s): GLUCAP in the last 168 hours. Iron Studies: No results for input(s): IRON, TIBC, TRANSFERRIN, FERRITIN in the last 72 hours. Studies/Results: DG Chest Port 1 View  Result Date: 10/24/2019 CLINICAL DATA:  Shortness of breath. EXAM: PORTABLE CHEST 1 VIEW COMPARISON:  02/14/2019 FINDINGS: Left jugular dialysis catheter. Catheter tip in the right atrium. Cardiac silhouette is enlarged. Central vascular structures are slightly enlarged. No significant peribronchial thickening or cuffing. Again  noted is a vascular stent in the right innominate vein region. Previous median sternotomy. IMPRESSION: 1. Slight enlargement of the central vascular structures. Findings may represent mild vascular congestion. No overt pulmonary edema. 2. Stable cardiomegaly. 3. Dialysis catheter. Electronically Signed   By: Markus Daft M.D.   On: 10/24/2019 08:05    Dialysis Orders:  [facility] MWF 4h 400/800 EDW 99.5kg 2K/3.5Ca UFP 4 TDC Heparin 3000 -Mircera 75 (last 3/22) Hectorol 1   Assessment/Plan: 1. Chest pain/ACS/Elevated troponin. Known CAD s/p CAGB x1 , DES x2 to RCA in 01/2019. DAPT. Cardiology following with plans for cath once able to lay flat. On heparin, nitro gtt.  2. ESRD -  HD MWF. Due for HD today. Challenging EDW d/t volume status   3. Hypertension/volume  - Volume apears up. Has been leaving at/below EDW as outpatient. Suspect loss of body mass. Challenging EDW for additional volume removal. UF 3-4L as tolerated.  4. Anemia  -  Hgb 8.6. On ESA as outpatient dosed 3/22. Follow Hgb trend. Keep Hgb >8 with CAD  5. Metabolic bone disease -   On added Ca bath as outpatient - not needed at present. Ca 9.0. Continue calcium acetate binder/hectorol  6. Nutrition - Renal diet/vitamins when eating   Lynnda Child PA-C [place] Pager [phone] 10/24/2019, 12:18 PM

## 2019-10-24 NOTE — H&P (Addendum)
Date: 10/24/2019               Patient Name:  Patrick [name] MRN: [id number]  DOB: 26-May-1956 Age / Sex: 64 y.o., male   PCP: Sonia Side., FNP         Medical Service: Internal Medicine Teaching Service         Attending Physician: Dr. Heber [place], Rachel Moulds, DO    First Contact: Darrick Meigs, MD, [place] Pager: [place] [phone])  Second Contact: Darrick Meigs, MD, Rylee Pager: [place] [phone])       After Hours (After 5p/  First Contact Pager: [phone]  weekends / holidays): Second Contact Pager: [phone]   Chief Complaint: chest pain  History of Present Illness: 64 y.o. 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  Presents with worsening chest pain over the last several weeks.  He describes it as a chest tightness worse with any activity it radiates all the way down his left arm.  Overnight the chest pain intensified and he called EMS.  He has had 3 SL nitro tabs one in the ambulance, with improvement in chest pain.  Currently he is mainly bothered by his dyspnea.  His dyspnea has also worsened over the last few weeks.  He reports being under his dry weight at dialysis has not missed any session.  He was hospitalized with similar symptoms one year ago and required atherectomy w/ overlapping stent x2 for high grade stenosis of RCA.    Meds:  Current Meds  Medication Sig  . acetaminophen (TYLENOL) 500 MG tablet Take 1,000 mg by mouth every 6 (six) hours as needed for moderate pain or headache.  . albuterol (PROVENTIL HFA;VENTOLIN HFA) 108 (90 Base) MCG/ACT inhaler Inhale 1-2 puffs into the lungs every 6 (six) hours as needed for wheezing or shortness of breath.  Marland Kitchen aspirin EC 81 MG tablet Take 81 mg by mouth daily.   . calcium acetate (PHOSLO) 667 MG capsule Take 1,334-2,001 mg by mouth See admin instructions. 3 capsules with dinner and 2 capsules with snacks  . calcium carbonate (OS-CAL - DOSED IN MG OF ELEMENTAL  CALCIUM) 1250 (500 Ca) MG tablet Take 2 tablets by mouth daily.   . clopidogrel (PLAVIX) 75 MG tablet TAKE 1 TABLET(75 MG) BY MOUTH DAILY (Patient taking differently: Take 75 mg by mouth daily. )  . Colchicine 0.6 MG CAPS Take 0.6 mg by mouth See admin instructions. Take 0.6 mg when first symptoms of gout flare occur, then take another 0.6 mg 14 days later as needed for gout  . dextromethorphan (DELSYM) 30 MG/5ML liquid Take 30 mg by mouth as needed for cough.  . Dextromethorphan-guaiFENesin 5-100 MG/5ML LIQD Take 15 mLs by mouth 2 (two) times daily as needed (Cough).   . docusate sodium (COLACE) 100 MG capsule Take 100 mg by mouth daily as needed for mild constipation.  Marland Kitchen ezetimibe (ZETIA) 10 MG tablet TAKE 1 TABLET(10 MG) BY MOUTH DAILY (Patient taking differently: Take 10 mg by mouth daily. )  . isosorbide mononitrate (IMDUR) 30 MG 24 hr tablet Take 1 tablet (30 mg total) by mouth daily.  Marland Kitchen lidocaine-prilocaine (EMLA) cream Apply 1 application topically daily as needed (Numbing).   . metoprolol succinate (TOPROL XL) 25 MG 24 hr tablet Take 1 tablet (25 mg total) by mouth daily.  . multivitamin (RENA-VIT) TABS tablet Take 1 tablet by mouth daily.   Marland Kitchen neomycin-bacitracin-polymyxin (  NEOSPORIN) ointment Apply 1 application topically 2 (two) times daily. Apply to feet.  . nitroGLYCERIN (NITROSTAT) 0.4 MG SL tablet Place 1 tablet (0.4 mg total) under the tongue every 5 (five) minutes as needed for chest pain.  . repaglinide (PRANDIN) 0.5 MG tablet Take 1 tablet (0.5 mg total) by mouth 2 (two) times daily before a meal.  . rosuvastatin (CRESTOR) 10 MG tablet Take 1 tablet (10 mg total) by mouth daily.  . silver sulfADIAZINE (SILVADENE) 1 % cream Apply 1 application topically daily. (Patient taking differently: Apply 1 application topically 2 (two) times daily. )  . traMADol (ULTRAM) 50 MG tablet Take 50 mg by mouth 2 (two) times daily as needed for moderate pain.      Allergies: Allergies as of  10/24/2019 - Review Complete 10/24/2019  Allergen Reaction Noted  . Baclofen Other (See Comments) 10/01/2018  . Iodinated diagnostic agents Swelling, Rash, and Other (See Comments) 10/18/2013  . Lipitor [atorvastatin] Other (See Comments) 06/02/2016  . Metoprolol Other (See Comments) 07/02/2016   Past Medical History:  Diagnosis Date  . Arthritis   . Atrial flutter ([place])    Ablation 2018  . 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 Erectile dysfunction   . ESRD (end stage renal disease) on dialysis ([place])    Pt on HD 2003 >> transplanted in 2009, back on HD in 2016. [name].   . Essential hypertension   . Gout   . Hernia of abdominal wall   . History of blood transfusion   . History of cardiomyopathy   . History of kidney stones   . History of pneumonia   . Migraine   . Myocardial infarction ([place])    1996  . Pneumonia   . Secondary hyperparathyroidism ([place])   . Type 2 diabetes mellitus ([place])   . Wears glasses     Family History:  Family History  Problem Relation Age of Onset  . Hyperlipidemia Mother   . Hypertension Mother   . HIV Sister      Social History:  Social History   Tobacco Use  . Smoking status: Former Smoker    Types: Cigarettes    Quit date: 07/04/2014    Years since quitting: 5.3  . Smokeless tobacco: Never Used  . Tobacco comment: "smoked ~ 1 pack/month when I did smoke; never a steady smoker"  Substance Use Topics  . Alcohol use: Yes    Alcohol/week: 0.0 standard drinks    Comment: rare  "2 drinks, 1-2 times/year"  . Drug use: No     Review of Systems: A complete ROS was negative except as per HPI.   Physical Exam: Blood pressure 112/88, pulse 96, temperature 99.1 F (37.3 C), temperature source Oral, resp. rate (!) 22, height 5' 6.5" (1.689 m), weight 101.2 kg, SpO2  99 %. Physical Exam Constitutional:      Appearance: He is not diaphoretic.  HENT:     Head: Normocephalic and atraumatic.  Eyes:     General: No scleral icterus.       Right eye: No discharge.        Left eye: No discharge.  Cardiovascular:     Rate and Rhythm: Normal rate and regular rhythm.     Heart sounds: Normal heart sounds. No murmur. No friction rub. No gallop.  Pulmonary:     Effort: Tachypnea and accessory muscle usage present. No respiratory distress.     Breath sounds: Examination of the right-lower field reveals decreased breath sounds and rales. Examination of the left-lower field reveals decreased breath sounds and rales. Decreased breath sounds and rales present. No wheezing.  Abdominal:     General: Bowel sounds are normal. There is no distension.     Palpations: Abdomen is soft. There is no mass.     Tenderness: There is no abdominal tenderness. There is no guarding.  Musculoskeletal:     Right lower leg: No edema.     Left lower leg: No edema.  Skin:    General: Skin is warm.  Neurological:     Mental Status: He is alert.     EKG: personally reviewed my interpretation is sinus rhythm, stable T wave inversions when compared to prior ecg  CXR: personally reviewed my interpretation is mild interstitial edema  Assessment & Plan by Problem: Active Problems:   NSTEMI (non-ST elevated myocardial infarction) [facility])  NSTEMI: in the setting of known CAD with prior cabg 1997 and atherectomy w/DES x2 placed one year ago.  He has elevated troponin, clinical signs of angina, has had improvement with nitroglycerin.  He is volume overloaded on exam with increased work of breathing.    -Cardiology following plan for [facility] after volume optimization with dialysis tomorrow am -PRN nitro SL, drip available if needed -may be able to add low dose beta blocker if bp allows says he no longer takes metoprolol due to it dropping his bp -on IV heparin continue -asa loaded continue  tomorrow, continue plavix -continue crestor  ESRD on MWF dialysis: no missed sessions, reports he is below his dry weight.  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  He is being transfused 1u before catheterization  -iron studies, workup for b12 def  Dispo: Admit patient to Inpatient with expected length of stay greater than 2 midnights.  Signed: Katherine Roan, MD 10/24/2019, 3:23 PM

## 2019-10-25 ENCOUNTER — Other Ambulatory Visit: Payer: Self-pay

## 2019-10-25 ENCOUNTER — Inpatient Hospital Stay (HOSPITAL_COMMUNITY): Payer: Medicare HMO

## 2019-10-25 ENCOUNTER — Encounter (HOSPITAL_COMMUNITY)
Admission: EM | Disposition: A | Discharge status: Nursing Home (Includes ICF) | Payer: Self-pay | Source: Home / Self Care | Attending: Cardiothoracic Surgery

## 2019-10-25 DIAGNOSIS — I5022 Chronic systolic (congestive) heart failure: Secondary | ICD-10-CM | POA: Diagnosis not present

## 2019-10-25 DIAGNOSIS — I214 Non-ST elevation (NSTEMI) myocardial infarction: Secondary | ICD-10-CM | POA: Diagnosis not present

## 2019-10-25 DIAGNOSIS — N186 End stage renal disease: Secondary | ICD-10-CM

## 2019-10-25 DIAGNOSIS — I2510 Atherosclerotic heart disease of native coronary artery without angina pectoris: Secondary | ICD-10-CM

## 2019-10-25 HISTORY — PX: LEFT HEART CATH AND CORS/GRAFTS ANGIOGRAPHY: CATH118250

## 2019-10-25 LAB — VITAMIN B12: Vitamin B-12: 415 pg/mL (ref 180–914)

## 2019-10-25 LAB — RENAL FUNCTION PANEL
Albumin: 3.4 g/dL — ABNORMAL LOW (ref 3.5–5.0)
Anion gap: 18 — ABNORMAL HIGH (ref 5–15)
BUN: 34 mg/dL — ABNORMAL HIGH (ref 8–23)
CO2: 21 mmol/L — ABNORMAL LOW (ref 22–32)
Calcium: 8.6 mg/dL — ABNORMAL LOW (ref 8.9–10.3)
Chloride: 99 mmol/L (ref 98–111)
Creatinine, Ser: 10.11 mg/dL — ABNORMAL HIGH (ref 0.61–1.24)
GFR calc Af Amer: 6 mL/min — ABNORMAL LOW (ref >60)
GFR calc non Af Amer: 5 mL/min — ABNORMAL LOW (ref >60)
Glucose, Bld: 177 mg/dL — ABNORMAL HIGH (ref 70–99)
Phosphorus: 3.1 mg/dL (ref 2.5–4.6)
Potassium: 4.5 mmol/L (ref 3.5–5.1)
Sodium: 138 mmol/L (ref 135–145)

## 2019-10-25 LAB — CBC
HCT: 41.6 % (ref 39.0–52.0)
Hemoglobin: 13.3 g/dL (ref 13.0–17.0)
MCH: 31.5 pg (ref 26.0–34.0)
MCHC: 32 g/dL (ref 30.0–36.0)
MCV: 98.6 fL (ref 80.0–100.0)
Platelets: 122 10*3/uL — ABNORMAL LOW (ref 150–400)
RBC: 4.22 MIL/uL (ref 4.22–5.81)
RDW: 15.7 % — ABNORMAL HIGH (ref 11.5–15.5)
WBC: 6.5 10*3/uL (ref 4.0–10.5)
nRBC: 0.3 % — ABNORMAL HIGH (ref 0.0–0.2)

## 2019-10-25 LAB — RETICULOCYTES
Immature Retic Fract: 26.3 % — ABNORMAL HIGH (ref 2.3–15.9)
RBC.: 3.23 MIL/uL — ABNORMAL LOW (ref 4.22–5.81)
Retic Count, Absolute: 70.4 10*3/uL (ref 19.0–186.0)
Retic Ct Pct: 2.2 % (ref 0.4–3.1)

## 2019-10-25 LAB — IRON AND TIBC
Iron: 35 ug/dL — ABNORMAL LOW (ref 45–182)
Saturation Ratios: 16 % — ABNORMAL LOW (ref 17.9–39.5)
TIBC: 217 ug/dL — ABNORMAL LOW (ref 250–450)
UIBC: 182 ug/dL

## 2019-10-25 LAB — POCT ACTIVATED CLOTTING TIME: Activated Clotting Time: 158 seconds

## 2019-10-25 LAB — HIV ANTIBODY (ROUTINE TESTING W REFLEX): HIV Screen 4th Generation wRfx: NONREACTIVE

## 2019-10-25 LAB — HEPARIN LEVEL (UNFRACTIONATED): Heparin Unfractionated: 0.31 IU/mL (ref 0.30–0.70)

## 2019-10-25 LAB — FERRITIN: Ferritin: 1293 ng/mL — ABNORMAL HIGH (ref 24–336)

## 2019-10-25 SURGERY — LEFT HEART CATH AND CORS/GRAFTS ANGIOGRAPHY
Anesthesia: LOCAL

## 2019-10-25 MED ORDER — HEPARIN (PORCINE) IN NACL 1000-0.9 UT/500ML-% IV SOLN
INTRAVENOUS | Status: AC
  Filled 2019-10-25: qty 1000

## 2019-10-25 MED ORDER — HEPARIN (PORCINE) 25000 UT/250ML-% IV SOLN
1150.0000 [IU]/h | INTRAVENOUS | Status: DC
  Administered 2019-10-26 (×2): 1150 [IU]/h via INTRAVENOUS
  Filled 2019-10-25 (×3): qty 250

## 2019-10-25 MED ORDER — IOHEXOL 350 MG/ML SOLN
INTRAVENOUS | Status: AC
  Filled 2019-10-25: qty 1

## 2019-10-25 MED ORDER — HYDRALAZINE HCL 20 MG/ML IJ SOLN: 10.0000 mg | INTRAMUSCULAR | Status: AC | PRN

## 2019-10-25 MED ORDER — FENTANYL CITRATE (PF) 100 MCG/2ML IJ SOLN
INTRAMUSCULAR | Status: AC
  Filled 2019-10-25: qty 2

## 2019-10-25 MED ORDER — IOHEXOL 350 MG/ML SOLN
INTRAVENOUS | Status: DC | PRN
  Administered 2019-10-25: 50 mL

## 2019-10-25 MED ORDER — LIDOCAINE HCL (PF) 1 % IJ SOLN
INTRAMUSCULAR | Status: DC | PRN
  Administered 2019-10-25: 15 mL

## 2019-10-25 MED ORDER — SODIUM CHLORIDE 0.9% FLUSH: 3.0000 mL | INTRAVENOUS | Status: DC | PRN

## 2019-10-25 MED ORDER — MIDAZOLAM HCL 2 MG/2ML IJ SOLN
INTRAMUSCULAR | Status: DC | PRN
  Administered 2019-10-25: 1 mg via INTRAVENOUS

## 2019-10-25 MED ORDER — HEPARIN (PORCINE) IN NACL 1000-0.9 UT/500ML-% IV SOLN
INTRAVENOUS | Status: DC | PRN
  Administered 2019-10-25 (×2): 500 mL

## 2019-10-25 MED ORDER — SODIUM CHLORIDE 0.9 % IV SOLN: 250.0000 mL | INTRAVENOUS | Status: DC | PRN

## 2019-10-25 MED ORDER — MIDAZOLAM HCL 2 MG/2ML IJ SOLN
INTRAMUSCULAR | Status: AC
  Filled 2019-10-25: qty 2

## 2019-10-25 MED ORDER — FENTANYL CITRATE (PF) 100 MCG/2ML IJ SOLN
INTRAMUSCULAR | Status: DC | PRN
  Administered 2019-10-25: 50 ug via INTRAVENOUS

## 2019-10-25 MED ORDER — LIDOCAINE HCL (PF) 1 % IJ SOLN
INTRAMUSCULAR | Status: AC
  Filled 2019-10-25: qty 30

## 2019-10-25 MED ORDER — CHLORHEXIDINE GLUCONATE CLOTH 2 % EX PADS
6.0000 | MEDICATED_PAD | Freq: Every day | CUTANEOUS | Status: DC
  Administered 2019-10-26: 06:00:00 6 via TOPICAL

## 2019-10-25 MED ORDER — SODIUM CHLORIDE 0.9% FLUSH: 3.0000 mL | Freq: Two times a day (BID) | INTRAVENOUS | Status: DC

## 2019-10-25 SURGICAL SUPPLY — 8 items
CATH DXT MULTI JL4 JR4 ANG PIG (CATHETERS) ×2 IMPLANT
CATH INFINITI 5FR AL1 (CATHETERS) ×2 IMPLANT
KIT HEART LEFT (KITS) ×2 IMPLANT
PACK CARDIAC CATHETERIZATION (CUSTOM PROCEDURE TRAY) ×2 IMPLANT
SHEATH PINNACLE 5F 10CM (SHEATH) ×2 IMPLANT
SYR MEDRAD MARK 7 150ML (SYRINGE) ×2 IMPLANT
TRANSDUCER W/STOPCOCK (MISCELLANEOUS) ×2 IMPLANT
TUBING CIL FLEX 10 FLL-RA (TUBING) ×2 IMPLANT

## 2019-10-25 NOTE — Progress Notes (Signed)
[place] for heparin Indication: chest pain/ACS  Allergies  Allergen Reactions  . Baclofen Other (See Comments)    Possibly stroke like symptoms  . Iodinated Diagnostic Agents Swelling, Rash and Other (See Comments)    Other Reaction: burning to mouth, swelling of lips  . Lipitor [Atorvastatin] Other (See Comments)    Leg pain  . Metoprolol Other (See Comments)    Headaches     Patient Measurements: Height: 5' 6.5" (168.9 cm) Weight: 214 lb 12.8 oz (97.4 kg) IBW/kg (Calculated) : 64.95 Heparin Dosing Weight: 87.2kg  Vital Signs: Temp: 98.4 F (36.9 C) (03/30 0738) Temp Source: Oral (03/30 0738) BP: 122/56 (03/30 0738) Pulse Rate: 80 (03/30 0738)  Labs: Recent Labs    10/24/19 0737 10/24/19 1911 10/25/19 0459  HGB 8.6*  --  13.3  HCT 27.7*  --  41.6  PLT 150  --  122*  HEPARINUNFRC  --  0.53 0.31  CREATININE 14.96*  --  10.11*  TROPONINIHS 2,995*  --   --     Estimated Creatinine Clearance: 8 mL/min (A) (by C-G formula based on SCr of 10.11 mg/dL (H)).   Medical History: Past Medical History:  Diagnosis Date  . Arthritis   . Atrial flutter ([place])    Ablation 2018  . 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 Erectile dysfunction   . ESRD (end stage renal disease) on dialysis ([place])    Pt on HD 2003 >> transplanted in 2009, back on HD in 2016. [name].   . Essential hypertension   . Gout   . Hernia of abdominal wall   . History of blood transfusion   . History of cardiomyopathy   . History of kidney stones   . History of pneumonia   . Migraine   . Myocardial infarction ([place])    1996  . Pneumonia   . Secondary hyperparathyroidism ([place])   . Type 2 diabetes mellitus ([place])   . Wears glasses     Medications:  Infusions:  . sodium chloride     . sodium chloride 10 mL/hr at 10/25/19 0646  . heparin 1,150 Units/hr (10/25/19 0336)  . nitroGLYCERIN Stopped (10/24/19 1237)    Assessment: 73 yom with ESRD on HD presented to the ED with CP and SOB. Troponin elevated and now starting IV heparin. Baseline Hgb is low at 8.6 and platelets are WNL. He is not on anticoagulation PTA.  HL remains therapeutic this AM at 0.31 on 1150 units/hr of IV heparin. H/H wnl. Plt down to 122k. RN reports no s/s of overt bleeding.   Goal of Therapy:  Heparin level 0.3-0.7 units/ml Monitor platelets by anticoagulation protocol: Yes   Plan:  Continue heparin IV at 1150 units/hr Monitor daily heparin level and CBC, s/sx bleeding F/u anticoag plans after LHC today    Albertina Parr, PharmD., BCPS Clinical Pharmacist Clinical phone for 10/25/19 until 3:30pm: [phone] If after 3:30pm, please refer to [facility] for unit-specific pharmacist

## 2019-10-25 NOTE — Progress Notes (Signed)
Progress Note  Patient Name: Patrick [name] Date of Encounter: 10/25/2019  Primary Cardiologist: Dorris Carnes, MD   Subjective   Pt denies CP  Breathing a lot better after dialysis    Inpatient Medications    Scheduled Meds: . sodium chloride   Intravenous Once  . aspirin  324 mg Oral Once  . aspirin EC  81 mg Oral [name]  . calcium acetate  2,001 mg Oral TID WC  . Chlorhexidine Gluconate Cloth  6 each Topical Q0600  . clopidogrel  75 mg Oral [name]  . diphenhydrAMINE  50 mg Oral Once   Or  . diphenhydrAMINE  50 mg Intravenous Once  . [START ON 10/26/2019] doxercalciferol  1 mcg Intravenous Q M,W,F-HD  . predniSONE  50 mg Oral Q6H  . rosuvastatin  10 mg Oral q1800  . sodium chloride flush  3 mL Intravenous Q12H   Continuous Infusions: . sodium chloride    . sodium chloride 10 mL/hr at 10/25/19 0646  . heparin 1,150 Units/hr (10/25/19 0336)  . nitroGLYCERIN Stopped (10/24/19 1237)   PRN Meds: sodium chloride, acetaminophen **OR** acetaminophen, nitroGLYCERIN, sodium chloride flush   Vital Signs    Vitals:   10/25/19 0328 10/25/19 0332 10/25/19 0449 10/25/19 0738  BP: 127/68   (!) 122/56  Pulse:    80  Resp: 17   18  Temp: 97.7 F (36.5 C)  98 F (36.7 C) 98.4 F (36.9 C)  TempSrc: Oral  Oral Oral  SpO2:   94% 99%  Weight:  97.4 kg    Height:        Intake/Output Summary (Last 24 hours) at 10/25/2019 0840 Last data filed at 10/25/2019 0300 Gross per 24 hour  Intake 1419.43 ml  Output 3500 ml  Net -2080.57 ml   Last 3 Weights 10/25/2019 10/24/2019 10/24/2019  Weight (lbs) 214 lb 12.8 oz 207 lb 7.3 oz 223 lb  Weight (kg) 97.433 kg 94.1 kg 101.152 kg      Telemetry    SR     I Personally Reviewed  ECG     NOt done - Personally Reviewed  Physical Exam   GEN: No acute distress.   Neck: JVP is normal   Cardiac: RRR, no murmurs, rubs, or gallops.  Respiratory: Clear to auscultation bilaterally. GI: Soft, nontender, non-distended  MS: No edema; No  deformity. Neuro:  Nonfocal  Psych: Normal affect   Labs    High Sensitivity Troponin:   Recent Labs  Lab 10/24/19 0737  TROPONINIHS 2,995*      Chemistry Recent Labs  Lab 10/24/19 0737 10/25/19 0459  NA 138 138  K 4.2 4.5  CL 98 99  CO2 22 21*  GLUCOSE 134* 177*  BUN 48* 34*  CREATININE 14.96* 10.11*  CALCIUM 9.5 8.6*  ALBUMIN  --  3.4*  GFRNONAA 3* 5*  GFRAA 3* 6*  ANIONGAP 18* 18*     Hematology Recent Labs  Lab 10/24/19 0737 10/25/19 0459  WBC 9.3 6.5  RBC 2.74* 4.22  3.23*  HGB 8.6* 13.3  HCT 27.7* 41.6  MCV 101.1* 98.6  MCH 31.4 31.5  MCHC 31.0 32.0  RDW 15.4 15.7*  PLT 150 122*    BNPNo results for input(s): BNP, PROBNP in the last 168 hours.   DDimer No results for input(s): DDIMER in the last 168 hours.   Radiology    DG Chest Port 1 View  Result Date: 10/24/2019 CLINICAL DATA:  Shortness of breath. EXAM: PORTABLE CHEST 1  VIEW COMPARISON:  02/14/2019 FINDINGS: Left jugular dialysis catheter. Catheter tip in the right atrium. Cardiac silhouette is enlarged. Central vascular structures are slightly enlarged. No significant peribronchial thickening or cuffing. Again noted is a vascular stent in the right innominate vein region. Previous median sternotomy. IMPRESSION: 1. Slight enlargement of the central vascular structures. Findings may represent mild vascular congestion. No overt pulmonary edema. 2. Stable cardiomegaly. 3. Dialysis catheter. Electronically Signed   By: Markus Daft M.D.   On: 10/24/2019 08:05    Cardiac Studies   Cath today    Patient Profile     Patrick [name] is a 64 y.o. 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 Tyrone Nine.  Assessment & Plan    1   NSTEMI  Known CAD  Intervention in July  Presents with [place] and NSTEMI   Plan for L heart cath today    2  ESRD   Got dialysis yesterday   Access has been an issure   Has temp catheters now  3  Chronic systolic CHF   Pt;s volume appears OK after dialysis     4  HL   Keep on crestor     For questions or updates, please contact [place] Please consult [email or web address] for contact info under        Signed, Dorris Carnes, MD  10/25/2019, 8:40 AM

## 2019-10-25 NOTE — Consult Note (Signed)
TCTS Consult--preliminary  Kindly asked to see pleasant 64 yo man with long-standing CAD, s/p CABG x 1 (LIMA to LAD) in 1997 and s/p PCI to RCA in 2020 who was admitted with SOB and chest pain, + NSTEMI. Underwent LHC today showing severe multivessel CAD not amenable to PCI and low likelihood for success with medical therapy, particularly given HD-dependent ESRD. I have reviewed his cath films and agree with suggestion for CABG. We will complete careful pre-operative work-up including Duplex exams, chest CT, PFTs and echo. Tentatively considering surgery 10/27/19. Full consult report to follow, pending studies. Discussed with patient and family who agree. Brynlee Pennywell Z. Orvan Seen, [place]

## 2019-10-25 NOTE — Progress Notes (Signed)
   Subjective:  No acute events overnight. Patient endorses improvement in shortness of breath and chest pain which have resolved at the time of interview. No other complaints.   Objective:  Vital signs in last 24 hours: Vitals:   10/25/19 0328 10/25/19 0332 10/25/19 0449 10/25/19 0738  BP: 127/68   (!) 122/56  Pulse:    80  Resp: 17   18  Temp: 97.7 F (36.5 C)  98 F (36.7 C) 98.4 F (36.9 C)  TempSrc: Oral  Oral Oral  SpO2:   94% 99%  Weight:  97.4 kg    Height:       Weight change:   Intake/Output Summary (Last 24 hours) at 10/25/2019 0815 Last data filed at 10/25/2019 0300 Gross per 24 hour  Intake 1419.43 ml  Output 3500 ml  Net -2080.57 ml   General appearance: alert, cooperative and appears stated age Lungs: Normal work of breathing in no respiratory distress. Slightly decreased breath sounds at lung bases with faint rales.  Chest wall: no tenderness Heart: regular rate and rhythm, S1, S2 normal, no murmur, click, rub or gallop Extremities: No edema in extremities. Dry skin and peeling noted at tip of L great toe Pulses: 2+ and symmetric throughout Skin: Skin color, texture, turgor normal. No rashes or lesions or other than described above   Assessment/Plan: By problem, as below  Summary: 64 y.o. 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  Presents with worsening chest pain over the last several weeks  NSTEMI In the setting of known CAD with prior cabg 1997 and atherectomy w/DES x2 placed one year ago. Troponin elevated to ~3,000 on admission, clinical signs of angina, has had improvement with nitroglycerin. Admission ECG demonstrating T wave depression in inferior and lateral leads which resolved on following ECG after SL nitroglycerin and rest.  -Cardiology following, plan for LHC today -PRN nitro SL, drip available if needed -Continue IV heparin -Continue ASA and  Plavix -Continue Crestor 10 mg daily. Total cholesterol and LDL on 02/15/2019 were 122 and 59, respectively.    ESRD on MWF HD S/p HD on 3/29. Volume status optimized for LHC later today, and patient appears euvolemic. Now patient is able to comfortably lie flat, no LE edema, and improved lung sounds.  -Nephrology following, appreciate recs   T2DM Thought to be the cause of his ESRD, no longer requiring treatment since ESRD  -Monitor while here; no need for treatment currently   Macrocytic Anemia S/p 1 unit pRBC 3/29. Hg increased from 8.6 to 13.3 after transfusion. Recent baseline Hg 11-12. B12 within normal limits at 415. Fraction of reticulocytes elevated to 26.3% indicating bone marrow response. Ferritin 1293, most likely 2/2 chronic illness. Iron studies demonstrate iron level of 35, TIBC 217, and saturation ratio 16% which indicate iron deficiency.  -Transfuse iron after LHC -Monitor daily CBC -Goal Hg >8 with CAD  Dispo -Expected day of discharge 3/31 -F/u LHC  Active Problems:   NSTEMI (non-ST elevated myocardial infarction) ([place])   LOS: 1 day   Landry Mellow, Medical Student 10/25/2019, 8:15 AM   Attestation for Student Documentation:  I personally was present and performed or re-performed the history, physical exam and medical decision-making activities of this service and have verified that the service and findings are accurately documented in the student's note.  Landry Mellow, Medical Student 10/25/2019, 8:15 AM

## 2019-10-25 NOTE — Progress Notes (Signed)
[place] for heparin Indication: chest pain/ACS  Allergies  Allergen Reactions  . Baclofen Other (See Comments)    Possibly stroke like symptoms  . Iodinated Diagnostic Agents Swelling, Rash and Other (See Comments)    Other Reaction: burning to mouth, swelling of lips  . Lipitor [Atorvastatin] Other (See Comments)    Leg pain  . Metoprolol Other (See Comments)    Headaches     Patient Measurements: Height: 5' 6.5" (168.9 cm) Weight: 214 lb 12.8 oz (97.4 kg) IBW/kg (Calculated) : 64.95 Heparin Dosing Weight: 87.2kg  Vital Signs: Temp: 98.9 F (37.2 C) (03/30 1316) Temp Source: Oral (03/30 1316) BP: 101/41 (03/30 1621) Pulse Rate: 91 (03/30 1621)  Labs: Recent Labs    10/24/19 0737 10/24/19 1911 10/25/19 0459  HGB 8.6*  --  13.3  HCT 27.7*  --  41.6  PLT 150  --  122*  HEPARINUNFRC  --  0.53 0.31  CREATININE 14.96*  --  10.11*  TROPONINIHS 2,995*  --   --     Estimated Creatinine Clearance: 8 mL/min (A) (by C-G formula based on SCr of 10.11 mg/dL (H)).   Medical History: Past Medical History:  Diagnosis Date  . Arthritis   . Atrial flutter ([place])    Ablation 2018  . 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 Erectile dysfunction   . ESRD (end stage renal disease) on dialysis ([place])    Pt on HD 2003 >> transplanted in 2009, back on HD in 2016. [name].   . Essential hypertension   . Gout   . Hernia of abdominal wall   . History of blood transfusion   . History of cardiomyopathy   . History of kidney stones   . History of pneumonia   . Migraine   . Myocardial infarction ([place])    1996  . Pneumonia   . Secondary hyperparathyroidism ([place])   . Type 2 diabetes mellitus ([place])   . Wears glasses     Medications:  Infusions:  . sodium chloride     . sodium chloride 10 mL/hr at 10/25/19 0646  . heparin 1,150 Units/hr (10/25/19 0336)  . nitroGLYCERIN Stopped (10/24/19 1237)    Assessment: 53 yom with ESRD on HD started on IV heparin for NSTEMI. He is not on anticoagulation PTA. Patient now s/p cath 3/30, and Cardiology recommending consideration of redo CABG. Pharmacy consulted to resume heparin 8 hours post-sheath removal (removed at 1628 per cath procedure log).  Heparin level remained therapeutic on 1150 units/hr of IV heparin this morning. H/H wnl (transfused PRBC on 3/29). Plt down to 122k. No active bleed issues reported.  Goal of Therapy:  Heparin level 0.3-0.7 units/ml Monitor platelets by anticoagulation protocol: Yes   Plan:  Resume heparin IV at previous therapeutic rate 1150 units/hr 3/31 at 0030 (8 hours post-sheath removal) 8hr heparin level from resumption Monitor daily heparin level and CBC, s/sx bleeding F/u possible plans for redo CABG   Arturo Morton, PharmD, BCPS Please check AMION for all [place] contact numbers Clinical Pharmacist 10/25/2019 5:02 PM

## 2019-10-25 NOTE — Progress Notes (Signed)
e

## 2019-10-25 NOTE — Progress Notes (Addendum)
[facility] Progress Note   Subjective: Seen in room. Feeling much better after dialysis yesterday. Had 3.5L removed. Breathing back to baseline, off supp O2. Denies CP, orthopnea this am.   Objective Vitals:   10/25/19 0328 10/25/19 0332 10/25/19 0449 10/25/19 0738  BP: 127/68   (!) 122/56  Pulse:    80  Resp: 17   18  Temp: 97.7 F (36.5 C)  98 F (36.7 C) 98.4 F (36.9 C)  TempSrc: Oral  Oral Oral  SpO2:   94% 99%  Weight:  97.4 kg    Height:        Weight change:    Additional Objective Labs: Basic Metabolic Panel: Recent Labs  Lab 10/24/19 0737 10/25/19 0459  NA 138 138  K 4.2 4.5  CL 98 99  CO2 22 21*  GLUCOSE 134* 177*  BUN 48* 34*  CREATININE 14.96* 10.11*  CALCIUM 9.5 8.6*  PHOS  --  3.1   CBC: Recent Labs  Lab 10/24/19 0737 10/25/19 0459  WBC 9.3 6.5  NEUTROABS 7.3  --   HGB 8.6* 13.3  HCT 27.7* 41.6  MCV 101.1* 98.6  PLT 150 122*   Blood Culture    Component Value Date/Time   SDES WOUND ABDOMEN 10/02/2018 1426   SDES WOUND ABDOMEN 10/02/2018 1426   SPECREQUEST Normal 10/02/2018 1426   SPECREQUEST Normal 10/02/2018 1426   CULT  10/02/2018 1426    No growth aerobically or anaerobically. Performed at [facility], [place] [address], [place], [place] [address]    CULT  10/02/2018 1426    No growth aerobically or anaerobically. Performed at [facility], [place] [address] [place], [place] [address]    REPTSTATUS 10/07/2018 FINAL 10/02/2018 1426   REPTSTATUS 10/07/2018 FINAL 10/02/2018 1426     Physical Exam General: Well appearing, sitting up in bed, nad  Heart: RRR  Lungs: Clear bilaterally, improved from yesterday  Abdomen: soft non-tender, non-distended  Extremities: no LE edema  Dialysis Access: L IJ TDC; LUE AVG clotted, not in use   Medications: . sodium chloride    . sodium chloride 10 mL/hr at 10/25/19 0646  . heparin 1,150 Units/hr (10/25/19 0336)  . nitroGLYCERIN Stopped (10/24/19 1237)   .  sodium chloride   Intravenous Once  . aspirin  324 mg Oral Once  . aspirin EC  81 mg Oral Daily  . calcium acetate  2,001 mg Oral TID WC  . Chlorhexidine Gluconate Cloth  6 each Topical Q0600  . clopidogrel  75 mg Oral Daily  . diphenhydrAMINE  50 mg Oral Once   Or  . diphenhydrAMINE  50 mg Intravenous Once  . [START ON 10/26/2019] doxercalciferol  1 mcg Intravenous Q M,W,F-HD  . predniSONE  50 mg Oral Q6H  . rosuvastatin  10 mg Oral q1800  . sodium chloride flush  3 mL Intravenous Q12H    Dialysis Orders:  [facility] MWF 4h 400/800 EDW 99.5kg 2K/3.5Ca UFP 4 TDC Heparin 3000 -Mircera 75 (last 3/22) Hectorol 1   Assessment/Plan: 1. Chest pain/ACS/Elevated troponin. Known CAD s/p CAGB x1, DESx 2. Cardiology following with plans for cath once able to lay flat.  On heparin, nitro gtt  2. ESRD -  HD MWF. Challenging EDW d/t volume status. Appears euvolemic today. Next HD 3/31.  3. Hypertension/volume  - Has been leaving at/below EDW as outpatient. Suspect loss of body mass. Challenging EDW for additional volume removal. Net UF 3.5L on 3/29. Post HD wt 94.1kg.  Well below OP weight >>Lower EDW at discharge.  4. Anemia  -  Hgb 13.3 Tsat 16% Ferritin 1293.  Received 1 unit prbcs 3/29. On ESA as outpatient dosed 3/22. Follow Hgb trend. Keep Hgb >8 with CAD  5. Metabolic bone disease -  On added Ca bath as outpatient. Ca/Phos ok. Follow trends. Continue calcium acetate binder/hectorol  6. Nutrition - Renal diet/vitamins when eating   Lynnda Child PA-C [facility] Pager [phone] 10/25/2019,8:49 AM  LOS: 1 day

## 2019-10-25 NOTE — H&P (View-Only) (Signed)
Progress Note  Patient Name: Patrick [name] Date of Encounter: 10/25/2019  Primary Cardiologist: Dorris Carnes, MD   Subjective   Pt denies CP  Breathing a lot better after dialysis    Inpatient Medications    Scheduled Meds: . sodium chloride   Intravenous Once  . aspirin  324 mg Oral Once  . aspirin EC  81 mg Oral [name]  . calcium acetate  2,001 mg Oral TID WC  . Chlorhexidine Gluconate Cloth  6 each Topical Q0600  . clopidogrel  75 mg Oral [name]  . diphenhydrAMINE  50 mg Oral Once   Or  . diphenhydrAMINE  50 mg Intravenous Once  . [START ON 10/26/2019] doxercalciferol  1 mcg Intravenous Q M,W,F-HD  . predniSONE  50 mg Oral Q6H  . rosuvastatin  10 mg Oral q1800  . sodium chloride flush  3 mL Intravenous Q12H   Continuous Infusions: . sodium chloride    . sodium chloride 10 mL/hr at 10/25/19 0646  . heparin 1,150 Units/hr (10/25/19 0336)  . nitroGLYCERIN Stopped (10/24/19 1237)   PRN Meds: sodium chloride, acetaminophen **OR** acetaminophen, nitroGLYCERIN, sodium chloride flush   Vital Signs    Vitals:   10/25/19 0328 10/25/19 0332 10/25/19 0449 10/25/19 0738  BP: 127/68   (!) 122/56  Pulse:    80  Resp: 17   18  Temp: 97.7 F (36.5 C)  98 F (36.7 C) 98.4 F (36.9 C)  TempSrc: Oral  Oral Oral  SpO2:   94% 99%  Weight:  97.4 kg    Height:        Intake/Output Summary (Last 24 hours) at 10/25/2019 0840 Last data filed at 10/25/2019 0300 Gross per 24 hour  Intake 1419.43 ml  Output 3500 ml  Net -2080.57 ml   Last 3 Weights 10/25/2019 10/24/2019 10/24/2019  Weight (lbs) 214 lb 12.8 oz 207 lb 7.3 oz 223 lb  Weight (kg) 97.433 kg 94.1 kg 101.152 kg      Telemetry    SR     I Personally Reviewed  ECG     NOt done - Personally Reviewed  Physical Exam   GEN: No acute distress.   Neck: JVP is normal   Cardiac: RRR, no murmurs, rubs, or gallops.  Respiratory: Clear to auscultation bilaterally. GI: Soft, nontender, non-distended  MS: No edema; No  deformity. Neuro:  Nonfocal  Psych: Normal affect   Labs    High Sensitivity Troponin:   Recent Labs  Lab 10/24/19 0737  TROPONINIHS 2,995*      Chemistry Recent Labs  Lab 10/24/19 0737 10/25/19 0459  NA 138 138  K 4.2 4.5  CL 98 99  CO2 22 21*  GLUCOSE 134* 177*  BUN 48* 34*  CREATININE 14.96* 10.11*  CALCIUM 9.5 8.6*  ALBUMIN  --  3.4*  GFRNONAA 3* 5*  GFRAA 3* 6*  ANIONGAP 18* 18*     Hematology Recent Labs  Lab 10/24/19 0737 10/25/19 0459  WBC 9.3 6.5  RBC 2.74* 4.22  3.23*  HGB 8.6* 13.3  HCT 27.7* 41.6  MCV 101.1* 98.6  MCH 31.4 31.5  MCHC 31.0 32.0  RDW 15.4 15.7*  PLT 150 122*    BNPNo results for input(s): BNP, PROBNP in the last 168 hours.   DDimer No results for input(s): DDIMER in the last 168 hours.   Radiology    DG Chest Port 1 View  Result Date: 10/24/2019 CLINICAL DATA:  Shortness of breath. EXAM: PORTABLE CHEST 1  VIEW COMPARISON:  02/14/2019 FINDINGS: Left jugular dialysis catheter. Catheter tip in the right atrium. Cardiac silhouette is enlarged. Central vascular structures are slightly enlarged. No significant peribronchial thickening or cuffing. Again noted is a vascular stent in the right innominate vein region. Previous median sternotomy. IMPRESSION: 1. Slight enlargement of the central vascular structures. Findings may represent mild vascular congestion. No overt pulmonary edema. 2. Stable cardiomegaly. 3. Dialysis catheter. Electronically Signed   By: Markus Daft M.D.   On: 10/24/2019 08:05    Cardiac Studies   Cath today    Patient Profile     Patrick [name] is a 64 y.o. 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 Tyrone Nine.  Assessment & Plan    1   NSTEMI  Known CAD  Intervention in July  Presents with [place] and NSTEMI   Plan for L heart cath today    2  ESRD   Got dialysis yesterday   Access has been an issure   Has temp catheters now  3  Chronic systolic CHF   Pt;s volume appears OK after dialysis     4  HL   Keep on crestor     For questions or updates, please contact [place] Please consult [email or web address] for contact info under        Signed, Dorris Carnes, MD  10/25/2019, 8:40 AM

## 2019-10-25 NOTE — Progress Notes (Signed)
   Vital Signs MEWS/VS Documentation      10/24/2019 1900 10/24/2019 1947 10/24/2019 2054 10/24/2019 2100   MEWS Score:  2  3  2  2    MEWS Score Color:  Yellow  Yellow  Yellow  Yellow   Resp:  --  (!) 22  --  (!) 24   Pulse:  --  (!) 107  --  --   BP:  --  (!) 108/58  --  --   Temp:  --  (!) 100.8 F (38.2 C)  98.1 F (36.7 C)  --   Level of Consciousness:  --  --  --  Alert     NT alerted of patient mews score of yellow.  The HR and RR elevation is not an acute change for the patient.  The TR was later rechecked because pt stated he was under a lot of blankets.  Repeat TR 98.1.        Patrick [name] 10/25/2019,12:07 AM

## 2019-10-25 NOTE — Interval H&P Note (Signed)
History and Physical Interval Note:  10/25/2019 3:53 PM  Patrick [name]  has presented today for surgery, with the diagnosis of chest pain.  The various methods of treatment have been discussed with the patient and family. After consideration of risks, benefits and other options for treatment, the patient has consented to  Procedure(s): LEFT HEART CATH AND CORS/GRAFTS ANGIOGRAPHY (N/A) as a surgical intervention.  The patient's history has been reviewed, patient examined, no change in status, stable for surgery.  I have reviewed the patient's chart and labs.  Questions were answered to the patient's satisfaction.   Cath Lab Visit (complete for each Cath Lab visit)  Clinical Evaluation Leading to the Procedure:   ACS: Yes.    Non-ACS:    Anginal Classification: CCS IV  Anti-ischemic medical therapy: Minimal Therapy (1 class of medications)  Non-Invasive Test Results: No non-invasive testing performed  Prior CABG: Previous CABG        Collier [facility] 10/25/2019 3:53 PM

## 2019-10-26 ENCOUNTER — Inpatient Hospital Stay (HOSPITAL_COMMUNITY): Payer: Medicare HMO

## 2019-10-26 DIAGNOSIS — I340 Nonrheumatic mitral (valve) insufficiency: Secondary | ICD-10-CM

## 2019-10-26 DIAGNOSIS — N186 End stage renal disease: Secondary | ICD-10-CM

## 2019-10-26 DIAGNOSIS — I214 Non-ST elevation (NSTEMI) myocardial infarction: Secondary | ICD-10-CM | POA: Diagnosis not present

## 2019-10-26 DIAGNOSIS — Z992 Dependence on renal dialysis: Secondary | ICD-10-CM

## 2019-10-26 DIAGNOSIS — D509 Iron deficiency anemia, unspecified: Secondary | ICD-10-CM

## 2019-10-26 DIAGNOSIS — I350 Nonrheumatic aortic (valve) stenosis: Secondary | ICD-10-CM

## 2019-10-26 DIAGNOSIS — E1122 Type 2 diabetes mellitus with diabetic chronic kidney disease: Secondary | ICD-10-CM

## 2019-10-26 DIAGNOSIS — Z01810 Encounter for preprocedural cardiovascular examination: Secondary | ICD-10-CM

## 2019-10-26 DIAGNOSIS — I25810 Atherosclerosis of coronary artery bypass graft(s) without angina pectoris: Secondary | ICD-10-CM

## 2019-10-26 DIAGNOSIS — I25110 Atherosclerotic heart disease of native coronary artery with unstable angina pectoris: Secondary | ICD-10-CM

## 2019-10-26 LAB — BASIC METABOLIC PANEL
Anion gap: 21 — ABNORMAL HIGH (ref 5–15)
BUN: 70 mg/dL — ABNORMAL HIGH (ref 8–23)
CO2: 20 mmol/L — ABNORMAL LOW (ref 22–32)
Calcium: 8.6 mg/dL — ABNORMAL LOW (ref 8.9–10.3)
Chloride: 95 mmol/L — ABNORMAL LOW (ref 98–111)
Creatinine, Ser: 12.8 mg/dL — ABNORMAL HIGH (ref 0.61–1.24)
GFR calc Af Amer: 4 mL/min — ABNORMAL LOW (ref >60)
GFR calc non Af Amer: 4 mL/min — ABNORMAL LOW (ref >60)
Glucose, Bld: 165 mg/dL — ABNORMAL HIGH (ref 70–99)
Potassium: 5 mmol/L (ref 3.5–5.1)
Sodium: 136 mmol/L (ref 135–145)

## 2019-10-26 LAB — BLOOD GAS, ARTERIAL
Acid-base deficit: 2.5 mmol/L — ABNORMAL HIGH (ref 0.0–2.0)
Bicarbonate: 22 mmol/L (ref 20.0–28.0)
Drawn by: 44898
FIO2: 21
O2 Saturation: 91.7 %
Patient temperature: 37.5
pCO2 arterial: 40.4 mmHg (ref 32.0–48.0)
pH, Arterial: 7.358 (ref 7.350–7.450)
pO2, Arterial: 80.4 mmHg — ABNORMAL LOW (ref 83.0–108.0)

## 2019-10-26 LAB — PULMONARY FUNCTION TEST
FEF 25-75 Pre: 0.73 L/sec
FEF2575-%Pred-Pre: 30 %
FEV1-%Pred-Pre: 57 %
FEV1-Pre: 1.49 L
FEV1FVC-%Pred-Pre: 89 %
FEV6-%Pred-Pre: 62 %
FEV6-Pre: 2.05 L
FEV6FVC-%Pred-Pre: 98 %
FVC-%Pred-Pre: 63 %
FVC-Pre: 2.17 L
Pre FEV1/FVC ratio: 69 %
Pre FEV6/FVC Ratio: 94 %

## 2019-10-26 LAB — CBC
HCT: 33.5 % — ABNORMAL LOW (ref 39.0–52.0)
Hemoglobin: 10.8 g/dL — ABNORMAL LOW (ref 13.0–17.0)
MCH: 31.7 pg (ref 26.0–34.0)
MCHC: 32.2 g/dL (ref 30.0–36.0)
MCV: 98.2 fL (ref 80.0–100.0)
Platelets: 164 10*3/uL (ref 150–400)
RBC: 3.41 MIL/uL — ABNORMAL LOW (ref 4.22–5.81)
RDW: 15.3 % (ref 11.5–15.5)
WBC: 13.1 10*3/uL — ABNORMAL HIGH (ref 4.0–10.5)
nRBC: 0 % (ref 0.0–0.2)

## 2019-10-26 LAB — ECHOCARDIOGRAM COMPLETE
Height: 66.5 in
Weight: 3460.8 oz

## 2019-10-26 LAB — HEPARIN LEVEL (UNFRACTIONATED): Heparin Unfractionated: 0.34 IU/mL (ref 0.30–0.70)

## 2019-10-26 MED ORDER — SODIUM CHLORIDE 0.9 % IV SOLN
1.5000 g | INTRAVENOUS | Status: AC
  Administered 2019-10-27: 1.5 g via INTRAVENOUS
  Filled 2019-10-26: qty 1.5

## 2019-10-26 MED ORDER — POTASSIUM CHLORIDE 2 MEQ/ML IV SOLN
80.0000 meq | INTRAVENOUS | Status: DC
  Filled 2019-10-26: qty 40

## 2019-10-26 MED ORDER — TEMAZEPAM 7.5 MG PO CAPS: 15.0000 mg | ORAL_CAPSULE | Freq: Once | ORAL | Status: DC | PRN

## 2019-10-26 MED ORDER — PHENYLEPHRINE HCL-NACL 20-0.9 MG/250ML-% IV SOLN
30.0000 ug/min | INTRAVENOUS | Status: DC
  Filled 2019-10-26: qty 250

## 2019-10-26 MED ORDER — MILRINONE LACTATE IN DEXTROSE 20-5 MG/100ML-% IV SOLN
0.3000 ug/kg/min | INTRAVENOUS | Status: DC
  Filled 2019-10-26: qty 100

## 2019-10-26 MED ORDER — TRANEXAMIC ACID (OHS) BOLUS VIA INFUSION
15.0000 mg/kg | INTRAVENOUS | Status: AC
  Administered 2019-10-27: 09:00:00 1471.5 mg via INTRAVENOUS
  Filled 2019-10-26: qty 1472

## 2019-10-26 MED ORDER — DEXMEDETOMIDINE HCL IN NACL 400 MCG/100ML IV SOLN
0.1000 ug/kg/h | INTRAVENOUS | Status: DC
  Filled 2019-10-26 (×2): qty 100

## 2019-10-26 MED ORDER — SODIUM CHLORIDE 0.9 % IV SOLN
750.0000 mg | INTRAVENOUS | Status: AC
  Administered 2019-10-27: 750 mg via INTRAVENOUS
  Filled 2019-10-26: qty 750

## 2019-10-26 MED ORDER — PERFLUTREN LIPID MICROSPHERE
1.0000 mL | INTRAVENOUS | Status: AC | PRN
  Administered 2019-10-26: 15:00:00 3 mL via INTRAVENOUS
  Filled 2019-10-26: qty 10

## 2019-10-26 MED ORDER — CHLORHEXIDINE GLUCONATE 0.12 % MT SOLN
15.0000 mL | Freq: Once | OROMUCOSAL | Status: AC
  Administered 2019-10-27: 06:00:00 15 mL via OROMUCOSAL
  Filled 2019-10-26: qty 15

## 2019-10-26 MED ORDER — PLASMA-LYTE 148 IV SOLN
INTRAVENOUS | Status: DC
  Filled 2019-10-26: qty 2.5

## 2019-10-26 MED ORDER — BISACODYL 5 MG PO TBEC: 5.0000 mg | DELAYED_RELEASE_TABLET | Freq: Once | ORAL | Status: DC

## 2019-10-26 MED ORDER — NOREPINEPHRINE 4 MG/250ML-% IV SOLN
0.0000 ug/min | INTRAVENOUS | Status: DC
  Filled 2019-10-26: qty 250

## 2019-10-26 MED ORDER — TRANEXAMIC ACID (OHS) PUMP PRIME SOLUTION
2.0000 mg/kg | INTRAVENOUS | Status: DC
  Filled 2019-10-26: qty 1.96

## 2019-10-26 MED ORDER — INSULIN REGULAR(HUMAN) IN NACL 100-0.9 UT/100ML-% IV SOLN
INTRAVENOUS | Status: DC
  Filled 2019-10-26: qty 100

## 2019-10-26 MED ORDER — SODIUM CHLORIDE 0.9 % IV SOLN
INTRAVENOUS | Status: DC
  Filled 2019-10-26: qty 30

## 2019-10-26 MED ORDER — METOPROLOL TARTRATE 12.5 MG HALF TABLET: 12.5000 mg | ORAL_TABLET | Freq: Once | ORAL | Status: DC

## 2019-10-26 MED ORDER — NITROGLYCERIN IN D5W 200-5 MCG/ML-% IV SOLN
2.0000 ug/min | INTRAVENOUS | Status: DC
  Filled 2019-10-26: qty 250

## 2019-10-26 MED ORDER — CHLORHEXIDINE GLUCONATE CLOTH 2 % EX PADS
6.0000 | MEDICATED_PAD | Freq: Once | CUTANEOUS | Status: AC
  Administered 2019-10-27: 05:00:00 6 via TOPICAL

## 2019-10-26 MED ORDER — EPINEPHRINE HCL 5 MG/250ML IV SOLN IN NS
0.0000 ug/min | INTRAVENOUS | Status: DC
  Filled 2019-10-26: qty 250

## 2019-10-26 MED ORDER — HEPARIN SODIUM (PORCINE) 1000 UNIT/ML IJ SOLN
INTRAMUSCULAR | Status: AC
  Administered 2019-10-26: 19:00:00 3800 [IU]
  Filled 2019-10-26: qty 4

## 2019-10-26 MED ORDER — CHLORHEXIDINE GLUCONATE CLOTH 2 % EX PADS
6.0000 | MEDICATED_PAD | Freq: Once | CUTANEOUS | Status: AC
  Administered 2019-10-26: 22:00:00 6 via TOPICAL

## 2019-10-26 MED ORDER — VANCOMYCIN HCL 1500 MG/300ML IV SOLN
1500.0000 mg | INTRAVENOUS | Status: AC
  Administered 2019-10-27: 09:00:00 1500 mg via INTRAVENOUS
  Filled 2019-10-26: qty 300

## 2019-10-26 MED ORDER — DOXERCALCIFEROL 4 MCG/2ML IV SOLN
INTRAVENOUS | Status: AC
  Administered 2019-10-26: 19:00:00 1 ug via INTRAVENOUS
  Filled 2019-10-26: qty 2

## 2019-10-26 MED ORDER — MAGNESIUM SULFATE 50 % IJ SOLN
40.0000 meq | INTRAMUSCULAR | Status: DC
  Filled 2019-10-26: qty 9.85

## 2019-10-26 MED ORDER — TRANEXAMIC ACID 1000 MG/10ML IV SOLN
1.5000 mg/kg/h | INTRAVENOUS | Status: DC
  Filled 2019-10-26 (×2): qty 25

## 2019-10-26 NOTE — Progress Notes (Signed)
Discussed sternal precautions, IS (1250 mL), mobility post op and d/c planning. Pt and wife receptive. His wife can be with him at d/c. Gave him materials to review and preop video to watch. Pt thankful. Will not ambulate due to CP. [id number] Patrick [name] CES, ACSM 11:36 AM 10/26/2019

## 2019-10-26 NOTE — Progress Notes (Signed)
[facility] Progress Note   Subjective: Seen in room. LHC yesterday showing severe 3 vessel disease. TCTS consulted for possible CABG. Hypotensive overnight and this am. Denies CP, some SOB back on supp O2. For HD today.    Objective Vitals:   10/26/19 0438 10/26/19 0500 10/26/19 0601 10/26/19 0635  BP: (!) 109/40 (!) 107/50 115/66 (!) 102/39  Pulse:    74  Resp: 11  13 16   Temp:      TempSrc:      SpO2:    96%  Weight:      Height:        Weight change: -3.039 kg   Additional Objective Labs: Basic Metabolic Panel: Recent Labs  Lab 10/24/19 0737 10/25/19 0459  NA 138 138  K 4.2 4.5  CL 98 99  CO2 22 21*  GLUCOSE 134* 177*  BUN 48* 34*  CREATININE 14.96* 10.11*  CALCIUM 9.5 8.6*  PHOS  --  3.1   CBC: Recent Labs  Lab 10/24/19 0737 10/25/19 0459 10/26/19 0802  WBC 9.3 6.5 13.1*  NEUTROABS 7.3  --   --   HGB 8.6* 13.3 10.8*  HCT 27.7* 41.6 33.5*  MCV 101.1* 98.6 98.2  PLT 150 122* 164   Blood Culture    Component Value Date/Time   SDES WOUND ABDOMEN 10/02/2018 1426   SDES WOUND ABDOMEN 10/02/2018 1426   SPECREQUEST Normal 10/02/2018 1426   SPECREQUEST Normal 10/02/2018 1426   CULT  10/02/2018 1426    No growth aerobically or anaerobically. Performed at [facility], [place] [address], [place], [place] [address]    CULT  10/02/2018 1426    No growth aerobically or anaerobically. Performed at [facility], [place] [address] [place], [place] [address]    REPTSTATUS 10/07/2018 FINAL 10/02/2018 1426   REPTSTATUS 10/07/2018 FINAL 10/02/2018 1426     Physical Exam General: Well appearing, sitting up in bed, nad  Heart: RRR  Lungs: Clear bilaterally, no wheeze, n rales  Abdomen: soft non-tender, non-distended  Extremities: no LE edema  Dialysis Access: L IJ TDC; LUE AVG clotted, not in use   Medications: . sodium chloride    . heparin 1,150 Units/hr (10/26/19 0124)  . nitroGLYCERIN 5 mcg/min (10/26/19 0641)   . sodium  chloride   Intravenous Once  . aspirin  324 mg Oral Once  . aspirin EC  81 mg Oral Daily  . calcium acetate  2,001 mg Oral TID WC  . Chlorhexidine Gluconate Cloth  6 each Topical Q0600  . doxercalciferol  1 mcg Intravenous Q M,W,F-HD  . rosuvastatin  10 mg Oral q1800  . sodium chloride flush  3 mL Intravenous Q12H    Dialysis Orders:  [facility] MWF 4h 400/800 EDW 99.5kg 2K/3.5Ca UFP 4 TDC Heparin 3000 -Mircera 75 (last 3/22) Hectorol 1   Assessment/Plan: 1. Chest pain/ACS/Elevated troponin. Known CAD s/p CAGB x1 1997,  DESx 2 2020. Cardiology following --LHC 3/30 showing severe 3 vessel disease not amenable to PCI.  TCTS consulted for possible CABG on 4/1.  2. ESRD -  HD MWF. Challenging EDW d/t volume status. Appears euvolemic today. Next HD 3/31.  3. Hypertension/volume  - Has been leaving at/below EDW as outpatient. Suspect loss of body mass. Challenging EDW for additional volume removal. Net UF 3.5L on 3/29. Post HD wt 94.1kg. Well below OP weight >>Lower EDW at discharge. Low BPs this am. Attempt 2.5-3L UF as tolerated.  4. Anemia  -  Hgb 8.6>13.3>10.8. Tsat  16% Ferritin 1293.  Received 1 unit prbcs 3/29. On ESA as outpatient dosed 3/22. Follow Hgb trend. Keep Hgb >8 with CAD  5. Metabolic bone disease -  On added Ca bath as outpatient. Ca/Phos ok. Follow trends. Continue calcium acetate binder/hectorol  6. Nutrition - Renal diet/vitamins when eating   Lynnda Child PA-C [facility] Pager [phone] 10/26/2019,9:18 AM  LOS: 2 days

## 2019-10-26 NOTE — Progress Notes (Signed)
Pre CABG exam completed.  Preliminary results can be found under CV proc under chart review.  10/26/2019 3:02 PM  Finlee Concepcion, K., RDMS, RVT

## 2019-10-26 NOTE — Progress Notes (Signed)
  Echocardiogram 2D Echocardiogram has been performed.  Jennette Dubin 10/26/2019, 2:47 PM

## 2019-10-26 NOTE — Progress Notes (Signed)
Subjective:  Overnight SBP fell to 70s and nitro drip was stopped. Recheck BP 86/34. Chest pain returned ~0600 at 7/10 level, nitro restarted and titrated up to 57mcg/hr with decrease in CP to 1/10. Since restarting nitro, BP has remained stable at 90s-100s/30s-40s.  At time of interview Patrick [name] endorses resolution of chest pain. Denies shortness of breath. No other complaints.   Objective:  Vital signs in last 24 hours: Vitals:   10/26/19 0438 10/26/19 0500 10/26/19 0601 10/26/19 0635  BP: (!) 109/40 (!) 107/50 115/66 (!) 102/39  Pulse:    74  Resp: 11  13 16   Temp:      TempSrc:      SpO2:    96%  Weight:      Height:       Weight change: -3.039 kg  Intake/Output Summary (Last 24 hours) at 10/26/2019 0658 Last data filed at 10/26/2019 0300 Gross per 24 hour  Intake 275.15 ml  Output 0 ml  Net 275.15 ml   General appearance: Resting comfortably in bed upon approach. Alert, cooperative and appears stated age Lungs: Normal work of breathing in no respiratory distress. Clear to auscultation bilaterally  Chest wall: No tenderness Heart: Regular rate and rhythm, S1, S2 normal, no murmur, click, rub or gallop Extremities: No edema in extremities. Dry skin and peeling noted at tip of L great toe Pulses: 2+ and symmetric throughout Skin: Skin color, texture, turgor normal. No rashes or lesions or other than described above   Assessment/Plan: By problem, as below  Summary: 64 y.o. 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  Presents with worsening chest pain over the last several weeks  NSTEMI In the setting of known CAD with prior cabg 1997 and atherectomy w/DES x2 placed one year ago. Admission troponin elevated to ~3,000 with clinical signs of angina. Left heart catheterization 3/30 showing severe multivessel CAD not amenable to PCI and low likelihood of success with medical therapy,  particularly in context of HD-dependent ESRD. Cardiothoracic Surgery has been consulted on this case, and we greatly appreciate their recommendations.   -Per CTS, plan is for repeat CABG on 4/1 pending pre-operative work-up including duplex exams, chest CT, PFTs, and echo -PRN nitro SL, drip available if needed. Stop if SBP <90 -Continue IV heparin -Continue ASA and Plavix -Continue Crestor 10 mg daily. Total cholesterol and LDL on 02/15/2019 were 122 and 59, respectively.    ESRD on MWF HD S/p HD on 3/29 with 3.5 kg UF. Patient appears euvolemic and is able to comfortably lie flat, no LE edema, and improved lung sounds.  -Nephrology following, appreciate recs   T2DM Thought to be the cause of his ESRD, no longer requiring treatment since ESRD. Morning BMPs demonstrate BG ranging 134-165.  -Monitor while here; no need for treatment currently   Macrocytic Anemia S/p 1 unit pRBC 3/29. Hg increased from 8.6 to 13.3 on 3/30; however, this was thought to be an error. CBC from 3/31 shows Hg of 10.8 which is consistent with his recent baseline of Hg 11-12. B12 within normal limits at 415, and ferritin high at 1293. Iron studies demonstrate iron level of 35, TIBC 217, and saturation ratio 16%. Altogether, in the context of HD-dependent CKD dosed with ESA, a TSAT <20% and ferritin >500, multiple etiologies are likely contributing including elements of anemia of chronic disease and functional anemia 2/2 ESA administration.  -Consider administration of  IV iron when appropriate as well as increasing ESA dosing if anemia persists post-op -Monitor daily CBC -Goal Hg >8 with CAD  Dispo -F/u CTS pre-op work-up, CABG planned for 4/1 -Expected day of discharge 4/3  Principal Problem:   NSTEMI (non-ST elevated myocardial infarction) ([place]) Active Problems:   ESRD (end stage renal disease) ([place])   LOS: 2 days   Patrick [name], Medical Student 10/26/2019, 6:58 AM

## 2019-10-26 NOTE — Progress Notes (Signed)
Pt in ultrasound right now.

## 2019-10-26 NOTE — Progress Notes (Addendum)
Patient SBP dropped to the 70s.  Nitro drip stopped.  Repeat BP 86/34. Patient is asymptomatic and CP free.  imts notified.   0155-MD returned page and stated okay to leave off for now and reassess with frequent VS.  Notify if CP returns.    0610-CP returned in intensity of 7/10.  Nitro restarted at 54mcg/hr.  Titrated up to 62mcg/hr.  Pt CP now 1/10.  imts MD and cardiology PA notified.

## 2019-10-26 NOTE — Progress Notes (Signed)
Progress Note  Patient Name: Patrick [name] Date of Encounter: 10/26/2019  Primary Cardiologist: Dorris Carnes, MD   Subjective   Pt says his breathing is OK  No CP    Inpatient Medications    Scheduled Meds: . sodium chloride   Intravenous Once  . aspirin  324 mg Oral Once  . aspirin EC  81 mg Oral [name]  . calcium acetate  2,001 mg Oral TID WC  . Chlorhexidine Gluconate Cloth  6 each Topical Q0600  . doxercalciferol  1 mcg Intravenous Q M,W,F-HD  . rosuvastatin  10 mg Oral q1800  . sodium chloride flush  3 mL Intravenous Q12H   Continuous Infusions: . sodium chloride    . heparin 1,150 Units/hr (10/26/19 0124)  . nitroGLYCERIN 5 mcg/min (10/26/19 0641)   PRN Meds: sodium chloride, acetaminophen **OR** acetaminophen, nitroGLYCERIN, sodium chloride flush   Vital Signs    Vitals:   10/26/19 0438 10/26/19 0500 10/26/19 0601 10/26/19 0635  BP: (!) 109/40 (!) 107/50 115/66 (!) 102/39  Pulse:    74  Resp: 11  13 16   Temp:      TempSrc:      SpO2:    96%  Weight:      Height:        Intake/Output Summary (Last 24 hours) at 10/26/2019 0721 Last data filed at 10/26/2019 0300 Gross per 24 hour  Intake 275.15 ml  Output 0 ml  Net 275.15 ml   Last 3 Weights 10/26/2019 10/25/2019 10/24/2019  Weight (lbs) 216 lb 4.8 oz 214 lb 12.8 oz 207 lb 7.3 oz  Weight (kg) 98.113 kg 97.433 kg 94.1 kg      Telemetry    SR   Personally Reviewed  ECG     NOt done - Personally Reviewed  Physical Exam   GEN: No acute distress.   Neck: JVP is not elevated    Cardiac: RRR, no murmurs, Respiratory: Clear to auscultation  MS: No edema; No deformity. Neuro:  Nonfocal  Psych: Normal affect   Labs    High Sensitivity Troponin:   Recent Labs  Lab 10/24/19 0737  TROPONINIHS 2,995*      Chemistry Recent Labs  Lab 10/24/19 0737 10/25/19 0459  NA 138 138  K 4.2 4.5  CL 98 99  CO2 22 21*  GLUCOSE 134* 177*  BUN 48* 34*  CREATININE 14.96* 10.11*  CALCIUM 9.5 8.6*    ALBUMIN  --  3.4*  GFRNONAA 3* 5*  GFRAA 3* 6*  ANIONGAP 18* 18*     Hematology Recent Labs  Lab 10/24/19 0737 10/25/19 0459  WBC 9.3 6.5  RBC 2.74* 4.22  3.23*  HGB 8.6* 13.3  HCT 27.7* 41.6  MCV 101.1* 98.6  MCH 31.4 31.5  MCHC 31.0 32.0  RDW 15.4 15.7*  PLT 150 122*    BNPNo results for input(s): BNP, PROBNP in the last 168 hours.   DDimer No results for input(s): DDIMER in the last 168 hours.   Radiology    CT CHEST WO CONTRAST  Result Date: 10/25/2019 CLINICAL DATA:  Preoperative evaluation for CABG, shortness of breath, chest pain EXAM: CT CHEST WITHOUT CONTRAST TECHNIQUE: Multidetector CT imaging of the chest was performed following the standard protocol without IV contrast. COMPARISON:  11/23/2015 FINDINGS: Cardiovascular: Postsurgical changes are seen from prior bypass surgery. There is prominent atherosclerosis of the aortic arch and descending thoracic aorta. Prominent calcification of the aortic valve and mitral annulus unchanged. The thoracic aorta is normal  in caliber. Evaluation of the lumen is limited by the lack of intravenous contrast. Stable stent in the right brachiocephalic vein. Left internal jugular catheter tip within the right atrium. Mediastinum/Nodes: No enlarged mediastinal or axillary lymph nodes. Numerous subcentimeter mediastinal lymph nodes are stable. Thyroid gland, trachea, and esophagus demonstrate no significant findings. Lungs/Pleura: Dependent atelectasis within the bilateral lower lobes. Mild background emphysema. No acute airspace disease, effusion, or pneumothorax. Central airways are patent. Upper Abdomen: Chronic renal cysts are again noted. Diffuse renal atrophy. No acute upper abdominal findings. Musculoskeletal: No acute or destructive bony lesions. Reconstructed images demonstrate no additional findings. IMPRESSION: 1. Minimal dependent lower lobe atelectasis. No acute airspace disease. 2. Aortic Atherosclerosis (ICD10-I70.0) and  Emphysema (ICD10-J43.9). 3. Chronic bilateral renal cysts. Electronically Signed   By: Randa Ngo M.D.   On: 10/25/2019 19:37   CARDIAC CATHETERIZATION  Result Date: 10/25/2019  Ost 1st Diag lesion is 50% stenosed.  Prox LAD lesion is 100% stenosed.  Ost 1st Mrg to 1st Mrg lesion is 50% stenosed.  Previously placed Mid RCA to Dist RCA drug eluting stent is widely patent.  Balloon angioplasty was performed.  Previously placed Prox RCA to Mid RCA drug eluting stent is widely patent.  Balloon angioplasty was performed.  Mid RCA lesion is 100% stenosed.  Ost Cx to Prox Cx lesion is 90% stenosed.  LV end diastolic pressure is mildly elevated.  1. Severe 3 vessel CAD    - CTO of the mid LAD after the first diagonal    - 90% ostial LCx    - 100% mid RCA in stent. Left to right collaterals. 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 3. Atretic LIMA graft 4. Mildly elevated LVEDP Plan: very difficult situation. Fairly early restenosis/occlusion of the RCA. This vessel is diffusely diseased and heavily calcified. The ostial LCx appears new compared to prior. Percutaneous options are very limited. Repeat PCI of the RCA is likely not to stay open long. Would consider whether he is a candidate for redo CABG.   DG Chest Port 1 View  Result Date: 10/24/2019 CLINICAL DATA:  Shortness of breath. EXAM: PORTABLE CHEST 1 VIEW COMPARISON:  02/14/2019 FINDINGS: Left jugular dialysis catheter. Catheter tip in the right atrium. Cardiac silhouette is enlarged. Central vascular structures are slightly enlarged. No significant peribronchial thickening or cuffing. Again noted is a vascular stent in the right innominate vein region. Previous median sternotomy. IMPRESSION: 1. Slight enlargement of the central vascular structures. Findings may represent mild vascular congestion. No overt pulmonary edema. 2. Stable cardiomegaly. 3. Dialysis catheter. Electronically Signed   By: Markus Daft M.D.   On:  10/24/2019 08:05    Cardiac Studies   [facility] 10/25/19   Ost 1st Diag lesion is 50% stenosed.  Prox LAD lesion is 100% stenosed.  Ost 1st Mrg to 1st Mrg lesion is 50% stenosed.  Previously placed Mid RCA to Dist RCA drug eluting stent is widely patent.  Balloon angioplasty was performed.  Previously placed Prox RCA to Mid RCA drug eluting stent is widely patent.  Balloon angioplasty was performed.  Mid RCA lesion is 100% stenosed.  Ost Cx to Prox Cx lesion is 90% stenosed.  LV end diastolic pressure is mildly elevated.   1. Severe 3 vessel CAD    - CTO of the mid LAD after the first diagonal    - 90% ostial LCx    - 100% mid RCA in stent. Left to right collaterals. 2. 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 3. Atretic LIMA graft 4. Mildly elevated LVEDP  Plan: very difficult situation. Fairly early restenosis/occlusion of the RCA. This vessel is diffusely diseased and heavily calcified. The ostial LCx appears new compared to prior. Percutaneous options are very limited. Repeat PCI of the RCA is likely not to stay open long. Would consider whether he is a candidate for redo CABG.   Patient Profile     Patrick [name] is a 64 y.o. 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 Tyrone Nine.  Assessment & Plan    1  NSTEMI  Known CAD  Intervention in July  Presents with [place] and NSTEMI    Cath done yesterday is noted above   Very difficult lesion in prox LCx and also closure of mid RCA    Reviewed with p [place]   He felt that consideration for CABG would be best option   Dr Orvan Seen has been consulted   Plan for possible CABG tomorrow   Tests today pre surgery     2  ESRD  Should get dialysis today     3  Chronic systolic CHF   Pt's volumeis not bad    4  HL   Keep on crestor     For questions or updates, please contact [name] Please consult [email or web address] for contact info under        Signed, Dorris Carnes, MD  10/26/2019, 7:21 AM

## 2019-10-26 NOTE — Consult Note (Signed)
[address]       [place], [address]             [phone]        [facility] Medical Record #[id number] Date of Birth: 18-Jun-1956  Referring: No ref. provider found Primary Care: Sonia Side., FNP Primary Cardiologist:Paula [name], MD  Chief Complaint:    Chief Complaint  Patient presents with  . Chest Pain  . Shortness of Breath    History of Present Illness:      64 yo man with 20+ year h/o CAD s/p CABG in 1997 (LIMA to LAD) and renal failure presented with increased chest pressure, tightness at rest which as worsened in severity and frequency over the past several days to weeks. He has been dialyzing successfully. He denies sx of heart failure. He was admitted after ruling in for NSTEMI. LHC shows severe multivessel CAD. Referral for CABG. Additional pre-operative work-up shows moderate to severe aortic valve insufficiency.  Current Activity/ Functional Status: Patient will be independent with mobility/ambulation, transfers, ADL's, IADL's.   Zubrod Score: At the time of surgery this patient's most appropriate activity status/level should be described as: []     0    Normal activity, no symptoms [x]     1    Restricted in physical strenuous activity but ambulatory, able to do out light work []     2    Ambulatory and capable of self care, unable to do work activities, up and about                 more than 50%  Of the time                            []     3    Only limited self care, in bed greater than 50% of waking hours []     4    Completely disabled, no self care, confined to bed or chair []     5    Moribund  Past Medical History:  Diagnosis Date  . Arthritis   . Atrial flutter ([place])    Ablation 2018  . 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 Erectile  dysfunction   . ESRD (end stage renal disease) on dialysis ([place])    Pt on HD 2003 >> transplanted in 2009, back on HD in 2016. [name]TS.   . Essential hypertension   . Gout   . Hernia of abdominal wall   . History of blood transfusion   . History of cardiomyopathy   . History of kidney stones   . History of pneumonia   . Migraine   . Myocardial infarction ([place])    1996  . Pneumonia   . Secondary hyperparathyroidism ([place])   . Type 2 diabetes mellitus ([place])   . Wears glasses     Past Surgical History:  Procedure Laterality Date  . A-FLUTTER ABLATION N/A 09/24/2016   Procedure: A-Flutter Ablation;  Surgeon: Will Meredith Leeds, MD;  Location: [facility];  Service: Cardiovascular;  Laterality: N/A;  . ABDOMINAL AORTOGRAM W/LOWER EXTREMITY N/A 04/13/2017   Procedure: ABDOMINAL AORTOGRAM W/LOWER EXTREMITY;  Surgeon: Conrad Hardin, MD;  Location: [facility];  Service:  Cardiovascular;  Laterality: N/A;  Bilater lower extermity  . APPENDECTOMY    . AV FISTULA PLACEMENT Right 09/18/2014   Procedure: INSERTION OF ARTERIOVENOUS (AV) GORE-TEX GRAFT ARM USING 4-7MM  X 45CM STRETCH GORE-TEX VASCULAR GRAFT;  Surgeon: Rosetta Posner, MD;  Location: [facility];  Service: Vascular;  Laterality: Right;  . AV FISTULA PLACEMENT Left 07/07/2016   Procedure: INSERTION OF LEFT BRACHIAL TO AXILLARY ARTERIOVENOUS (AV) GORE-TEX ARM GRAFT;  Surgeon: Conrad Valle Vista, MD;  Location: [facility];  Service: Vascular;  Laterality: Left;  . AV FISTULA PLACEMENT Left 12/09/2018   Procedure: Insertion Of Arteriovenous (Av) Gore-Tex Graft Arm, left arm;  Surgeon: Marty Heck, MD;  Location: [facility];  Service: Vascular;  Laterality: Left;  . CARDIAC CATHETERIZATION  ~ 2016  . COLONOSCOPY    . 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 CORONARY ATHERECTOMY N/A 02/16/2019   Procedure: CORONARY ATHERECTOMY;  Surgeon: Martinique, Peter M, MD;   Location: [facility];  Service: Cardiovascular;  Laterality: N/A;  . EXCHANGE OF A DIALYSIS CATHETER Left 01/11/2018   Procedure: EXCHANGE OF TUNNELED DIALYSIS CATHETER;  Surgeon: Rosetta Posner, MD;  Location: [facility];  Service: Vascular;  Laterality: Left;  . HERNIA REPAIR  2017   with nephrectomy  . INSERTION OF DIALYSIS CATHETER N/A 10/08/2017   Procedure: INSERTION OF TUNNELED DIALYSIS CATHETER;  Surgeon: Conrad [place] Paso, MD;  Location: [facility];  Service: Vascular;  Laterality: N/A;  . IR FLUORO GUIDE CV LINE LEFT  03/12/2018  . IR GENERIC HISTORICAL  05/11/2016   IR FLUORO GUIDE CV LINE LEFT 05/11/2016 Corrie Mckusick, DO [facility]  . IR GENERIC HISTORICAL  05/11/2016   IR US GUIDE VASC ACCESS LEFT 05/11/2016 Corrie Mckusick, DO [facility]  . IR GENERIC HISTORICAL  05/11/2016   IR US GUIDE VASC ACCESS RIGHT 05/11/2016 Corrie Mckusick, DO [facility]  . IR GENERIC HISTORICAL  05/11/2016   IR RADIOLOGY PERIPHERAL GUIDED IV START 05/11/2016 Corrie Mckusick, DO [facility]  . KIDNEY TRANSPLANT  2009  . LEFT HEART CATH AND CORONARY ANGIOGRAPHY N/A 02/15/2019   Procedure: LEFT HEART CATH AND CORONARY ANGIOGRAPHY;  Surgeon: Martinique, Peter M, MD;  Location: [facility];  Service: Cardiovascular;  Laterality: N/A;  . LEFT HEART CATH AND CORS/GRAFTS ANGIOGRAPHY N/A 12/04/2017   Procedure: LEFT HEART CATH AND CORS/GRAFTS ANGIOGRAPHY;  Surgeon: Belva Crome, MD;  Location: [facility];  Service: Cardiovascular;  Laterality: N/A;  . LEFT HEART CATH AND CORS/GRAFTS ANGIOGRAPHY N/A 10/25/2019   Procedure: LEFT HEART CATH AND CORS/GRAFTS ANGIOGRAPHY;  Surgeon: Martinique, Peter M, MD;  Location: [facility];  Service: Cardiovascular;  Laterality: N/A;  . NEPHRECTOMY  2017   transplant rejected   . PERIPHERAL VASCULAR CATHETERIZATION N/A 06/04/2016   Procedure: Upper Extremity Venography;  Surgeon: Waynetta Sandy, MD;  Location: [facility];  Service: Cardiovascular;   Laterality: N/A;  . PERIPHERAL VASCULAR INTERVENTION  04/13/2017   Procedure: PERIPHERAL VASCULAR INTERVENTION;  Surgeon: Conrad Sussex, MD;  Location: [facility];  Service: Cardiovascular;;  Lt. Common/Exernal  Iliac  . THROMBECTOMY AND REVISION OF ARTERIOVENTOUS (AV) GORETEX  GRAFT Left 10/08/2017   Procedure: THROMBECTOMY of ARTERIOVENTOUS (AV) GORETEX  GRAFT LEFT UPPER ARM;  Surgeon: Conrad , MD;  Location: [facility];  Service: Vascular;  Laterality: Left;  . THROMBECTOMY W/ EMBOLECTOMY Left 09/14/2017  Procedure: THROMBECTOMY ARTERIOVENOUS GORE-TEX GRAFT LEFT UPPER ARM;  Surgeon: Rosetta Posner, MD;  Location: [facility];  Service: Vascular;  Laterality: Left;  . UPPER EXTREMITY ANGIOGRAPHY Bilateral 10/29/2018   Procedure: UPPER EXTREMITY ANGIOGRAPHY;  Surgeon: Marty Heck, MD;  Location: [facility];  Service: Cardiovascular;  Laterality: Bilateral;  . UPPER EXTREMITY VENOGRAPHY N/A 11/16/2017   Procedure: UPPER EXTREMITY VENOGRAPHY - Right Arm;  Surgeon: Conrad Cowlington, MD;  Location: [facility];  Service: Cardiovascular;  Laterality: N/A;  . UPPER EXTREMITY VENOGRAPHY Bilateral 10/29/2018   Procedure: UPPER EXTREMITY VENOGRAPHY;  Surgeon: Marty Heck, MD;  Location: [facility];  Service: Cardiovascular;  Laterality: Bilateral;    Social History   Tobacco Use  Smoking Status Former Smoker  . Types: Cigarettes  . Quit date: 07/04/2014  . Years since quitting: 5.3  Smokeless Tobacco Never Used  Tobacco Comment   "smoked ~ 1 pack/month when I did smoke; never a steady smoker"    Social History   Substance and Sexual Activity  Alcohol Use Yes  . Alcohol/week: 0.0 standard drinks   Comment: rare  "2 drinks, 1-2 times/year"     Allergies  Allergen Reactions  . Baclofen Other (See Comments)    Possibly stroke like symptoms  . Iodinated Diagnostic Agents Swelling, Rash and Other (See Comments)    Other Reaction: burning to mouth, swelling of  lips  . Lipitor [Atorvastatin] Other (See Comments)    Leg pain  . Metoprolol Other (See Comments)    Headaches     Current Facility-Administered Medications  Medication Dose Route Frequency Provider Last Rate Last Admin  . 0.9 %  sodium chloride infusion (Manually program via Guardrails IV Fluids)   Intravenous Once Martinique, Peter M, MD      . 0.9 %  sodium chloride infusion  250 mL Intravenous PRN Martinique, Peter M, MD      . acetaminophen (TYLENOL) tablet 650 mg  650 mg Oral Q6H PRN Martinique, Peter M, MD       Or  . acetaminophen (TYLENOL) suppository 650 mg  650 mg Rectal Q6H PRN Martinique, Peter M, MD      . aspirin chewable tablet 324 mg  324 mg Oral Once Martinique, Peter M, MD      . aspirin EC tablet 81 mg  81 mg Oral Daily Martinique, Peter M, MD   81 mg at 10/26/19 0946  . calcium acetate (PHOSLO) capsule 2,001 mg  2,001 mg Oral TID WC Martinique, Peter M, MD   2,001 mg at 10/26/19 1317  . [START ON 10/27/2019] cefUROXime (ZINACEF) 1.5 g in sodium chloride 0.9 % 100 mL IVPB  1.5 g Intravenous To OR Kaydie Petsch, [place] Bow, MD      . Derrill Memo ON 10/27/2019] cefUROXime (ZINACEF) 750 mg in sodium chloride 0.9 % 100 mL IVPB  750 mg Intravenous To OR Esco Joslyn, Glenice Bow, MD      . Chlorhexidine Gluconate Cloth 2 % PADS 6 each  6 each Topical Q0600 Martinique, Peter M, MD   6 each at 10/26/19 [phone]  . [START ON 10/27/2019] dexmedetomidine (PRECEDEX) 400 MCG/100ML (4 mcg/mL) infusion  0.1-0.7 mcg/kg/hr Intravenous To OR Nillie Bartolotta, [place] Bow, MD      . doxercalciferol (HECTOROL) injection 1 mcg  1 mcg Intravenous Q M,W,F-HD Martinique, Peter M, MD   1 mcg at 10/24/19 1639  . [START ON 10/27/2019] EPINEPHrine (ADRENALIN) 4 mg in NS 250 mL (0.016 mg/mL) premix infusion  0-10 mcg/min Intravenous To OR Wonda Olds, MD      . Derrill Memo ON 10/27/2019] heparin 2,500 Units, papaverine 30 mg in electrolyte-148 (PLASMALYTE-148) 500 mL irrigation   Irrigation To OR Donyel Nester, [place] Bow, MD      . Derrill Memo ON 10/27/2019] heparin 30,000 units/NS 1000 mL  solution for CELLSAVER   Other To OR Tonnie Stillman, [place] Bow, MD      . heparin ADULT infusion 100 units/mL (25000 units/293mL sodium chloride 0.45%)  1,150 Units/hr Intravenous Continuous von Dohlen, Haley B, RPH 11.5 mL/hr at 10/26/19 0124 1,150 Units/hr at 10/26/19 0124  . [START ON 10/27/2019] insulin regular, human (MYXREDLIN) 100 units/ 100 mL infusion   Intravenous To OR Asani Deniston, [place] Bow, MD      . Derrill Memo ON 10/27/2019] magnesium sulfate (IV Push/IM) injection 40 mEq  40 mEq Other To OR Kasiya Burck, [place] Bow, MD      . Derrill Memo ON 10/27/2019] milrinone (PRIMACOR) 20 MG/100 ML (0.2 mg/mL) infusion  0.3 mcg/kg/min Intravenous To OR Hale Chalfin Z, MD      . nitroGLYCERIN (NITROSTAT) SL tablet 0.4 mg  0.4 mg Sublingual Q5 min PRN Martinique, Peter M, MD   0.4 mg at 10/25/19 1738  . nitroGLYCERIN 50 mg in dextrose 5 % 250 mL (0.2 mg/mL) infusion  0-200 mcg/min Intravenous Continuous Martinique, Peter M, MD 1.5 mL/hr at 10/26/19 0641 5 mcg/min at 10/26/19 0641  . [START ON 10/27/2019] nitroGLYCERIN 50 mg in dextrose 5 % 250 mL (0.2 mg/mL) infusion  2-200 mcg/min Intravenous To OR Christoph Copelan, [place] Bow, MD      . Derrill Memo ON 10/27/2019] norepinephrine (LEVOPHED) 4mg  in 248mL premix infusion  0-40 mcg/min Intravenous To OR Wonda Olds, MD      . Derrill Memo ON 10/27/2019] phenylephrine (NEOSYNEPHRINE) 20-0.9 MG/250ML-% infusion  30-200 mcg/min Intravenous To OR Wonda Olds, MD      . Derrill Memo ON 10/27/2019] potassium chloride injection 80 mEq  80 mEq Other To OR Charis Juliana Z, MD      . rosuvastatin (CRESTOR) tablet 10 mg  10 mg Oral q1800 Martinique, Peter M, MD   10 mg at 10/25/19 1749  . sodium chloride flush (NS) 0.9 % injection 3 mL  3 mL Intravenous Q12H Martinique, Peter M, MD      . sodium chloride flush (NS) 0.9 % injection 3 mL  3 mL Intravenous PRN Martinique, Peter M, MD      . Derrill Memo ON 10/27/2019] tranexamic acid (CYKLOKAPRON) 2,500 mg in sodium chloride 0.9 % 250 mL (10 mg/mL) infusion  1.5 mg/kg/hr Intravenous To OR Wonda Olds, MD      . Derrill Memo ON 10/27/2019] tranexamic acid (CYKLOKAPRON) bolus via infusion - over 30 minutes 1,471.5 mg  15 mg/kg Intravenous To OR Wonda Olds, MD      . Derrill Memo ON 10/27/2019] tranexamic acid (CYKLOKAPRON) pump prime solution 196 mg  2 mg/kg Intracatheter To OR Earl Zellmer, [place] Bow, MD      . Derrill Memo ON 10/27/2019] vancomycin (VANCOREADY) IVPB 1500 mg/300 mL  1,500 mg Intravenous To OR Norris Brumbach, [place] Bow, MD        Medications Prior to Admission  Medication Sig Dispense Refill Last Dose  . acetaminophen (TYLENOL) 500 MG tablet Take 1,000 mg by mouth every 6 (six) hours as needed for moderate pain or headache.   Past Week at Unknown time  . albuterol (PROVENTIL HFA;VENTOLIN HFA) 108 (90 Base) MCG/ACT inhaler Inhale 1-2 puffs into the lungs every 6 (six) hours  as needed for wheezing or shortness of breath. 1 Inhaler 0 unk  . aspirin EC 81 MG tablet Take 81 mg by mouth daily.    10/24/2019 at Unknown time  . calcium acetate (PHOSLO) 667 MG capsule Take 1,334-2,001 mg by mouth See admin instructions. 3 capsules with dinner and 2 capsules with snacks   Past Week at Unknown time  . calcium carbonate (OS-CAL - DOSED IN MG OF ELEMENTAL CALCIUM) 1250 (500 Ca) MG tablet Take 2 tablets by mouth daily.    Past Month at Unknown time  . clopidogrel (PLAVIX) 75 MG tablet TAKE 1 TABLET(75 MG) BY MOUTH DAILY (Patient taking differently: Take 75 mg by mouth daily. ) 90 tablet 3 Past Week at 2000  . Colchicine 0.6 MG CAPS Take 0.6 mg by mouth See admin instructions. Take 0.6 mg when first symptoms of gout flare occur, then take another 0.6 mg 14 days later as needed for gout   unk  . dextromethorphan (DELSYM) 30 MG/5ML liquid Take 30 mg by mouth as needed for cough.   unk  . Dextromethorphan-guaiFENesin 5-100 MG/5ML LIQD Take 15 mLs by mouth 2 (two) times daily as needed (Cough).    unk  . docusate sodium (COLACE) 100 MG capsule Take 100 mg by mouth daily as needed for mild constipation.   unk  .  ezetimibe (ZETIA) 10 MG tablet TAKE 1 TABLET(10 MG) BY MOUTH DAILY (Patient taking differently: Take 10 mg by mouth daily. ) 90 tablet 3 Past Week at Unknown time  . isosorbide mononitrate (IMDUR) 30 MG 24 hr tablet Take 1 tablet (30 mg total) by mouth daily. 90 tablet 3 Past Week at Unknown time  . lidocaine-prilocaine (EMLA) cream Apply 1 application topically daily as needed (Numbing).    Past Month at Unknown time  . metoprolol succinate (TOPROL XL) 25 MG 24 hr tablet Take 1 tablet (25 mg total) by mouth daily. 90 tablet 3 Past Week at 2100  . multivitamin (RENA-VIT) TABS tablet Take 1 tablet by mouth daily.    Past Week at Unknown time  . neomycin-bacitracin-polymyxin (NEOSPORIN) ointment Apply 1 application topically 2 (two) times daily. Apply to feet.     . nitroGLYCERIN (NITROSTAT) 0.4 MG SL tablet Place 1 tablet (0.4 mg total) under the tongue every 5 (five) minutes as needed for chest pain. 25 tablet 6 10/24/2019 at Unknown time  . repaglinide (PRANDIN) 0.5 MG tablet Take 1 tablet (0.5 mg total) by mouth 2 (two) times daily before a meal. 60 tablet 4 Past Week at Unknown time  . rosuvastatin (CRESTOR) 10 MG tablet Take 1 tablet (10 mg total) by mouth daily. 90 tablet 3 Past Week at Unknown time  . silver sulfADIAZINE (SILVADENE) 1 % cream Apply 1 application topically daily. (Patient taking differently: Apply 1 application topically 2 (two) times daily. ) 50 g 0 Past Week at Unknown time  . traMADol (ULTRAM) 50 MG tablet Take 50 mg by mouth 2 (two) times daily as needed for moderate pain.    Past Week at Unknown time  . doxycycline (VIBRA-TABS) 100 MG tablet Take 1 tablet (100 mg total) by mouth 2 (two) times daily. (Patient not taking: Reported on 10/24/2019) 20 tablet 0 Completed Course at Unknown time  . Methoxy PEG-Epoetin Beta (MIRCERA IJ) Mircera     . oxyCODONE-acetaminophen (PERCOCET/ROXICET) 5-325 MG tablet Take 1 tablet by mouth every 6 (six) hours as needed. (Patient not taking:  Reported on 10/24/2019) 12 tablet 0 Not Taking at Unknown  time  . pantoprazole (PROTONIX) 40 MG tablet Take 1 tablet (40 mg total) by mouth daily. (Patient not taking: Reported on 10/24/2019) 90 tablet 3 Not Taking at Unknown time    Family History  Problem Relation Age of Onset  . Hyperlipidemia Mother   . Hypertension Mother   . HIV Sister      Review of Systems:   ROS A comprehensive review of systems was negative.     Cardiac Review of Systems: Y or  [    ]= no  Chest Pain [ y   ]  Resting SOB [  n] Exertional SOB  [  ]  Orthopnea [ n ]   Pedal Edema [   ]    Palpitations [  ] Syncope  [  ]   Presyncope [   ]  General Review of Systems: [Y] = yes [  ]=no Constitional: recent weight change [ n ]; anorexia [n  ]; fatigue Blue.Reese  ]; nausea [  ]; night sweats [  ]; fever [  ]; or chills [  ]                                                               Dental: Last Dentist visit:   Eye : blurred vision [  ]; diplopia [   ]; vision changes [ n ];  Amaurosis fugax[ n ]; Resp: cough [ n ];  wheezing[n  ];  hemoptysis[n  ]; shortness of breath[  n]; paroxysmal nocturnal dyspnea[  ]; dyspnea on exertion[  ]; or orthopnea[  ];  GI:  gallstones[  ], vomiting[  ];  dysphagia[  ]; melena[  ];  hematochezia [  ]; heartburn[  ];   Hx of  Colonoscopy[  ]; GU: kidney stones [  ]; hematuria[  ];   dysuria [  ];  nocturia[  ];  history of     obstruction [  ]; urinary frequency [  ]             Skin: rash, swelling[  ];, hair loss[  ];  peripheral edema[  ];  or itching[  ]; Musculosketetal: myalgias[  ];  joint swelling[  ];  joint erythema[  ];  joint pain[  ];  back pain[  ];  Heme/Lymph: bruising[  ];  bleeding[  ];  anemia[  ];  Neuro: TIA[  ];  headaches[  ];  stroke[  ];  vertigo[  ];  seizures[  ];   paresthesias[  ];  difficulty walking[  ];  Psych:depression[  ]; anxiety[  ];  Endocrine: diabetes[  ];  thyroid dysfunction[  ];         Physical Exam: BP (!) 90/24   Pulse 66   Temp 97.8 F  (36.6 C) (Oral)   Resp 15   Ht 5' 6.5" (1.689 m)   Wt 91.6 kg   SpO2 96%   BMI 32.11 kg/m    General appearance: alert and cooperative Head: Normocephalic, without obvious abnormality, atraumatic Neck: no adenopathy, no carotid bruit, no JVD, supple, symmetrical, trachea midline and thyroid not enlarged, symmetric, no tenderness/mass/nodules Resp: diminished breath sounds bilaterally Cardio: regular rate and rhythm, S1, S2 normal, no murmur, click, rub or gallop GI: soft, non-tender; bowel sounds  normal; no masses,  no organomegaly Extremities: extremities normal, atraumatic, no cyanosis or edema Neurologic: Alert and oriented X 3, normal strength and tone. Normal symmetric reflexes. Normal coordination and gait  Diagnostic Studies & Laboratory data:     Recent Radiology Findings:   CT CHEST WO CONTRAST  Result Date: 10/25/2019 CLINICAL DATA:  Preoperative evaluation for CABG, shortness of breath, chest pain EXAM: CT CHEST WITHOUT CONTRAST TECHNIQUE: Multidetector CT imaging of the chest was performed following the standard protocol without IV contrast. COMPARISON:  11/23/2015 FINDINGS: Cardiovascular: Postsurgical changes are seen from prior bypass surgery. There is prominent atherosclerosis of the aortic arch and descending thoracic aorta. Prominent calcification of the aortic valve and mitral annulus unchanged. The thoracic aorta is normal in caliber. Evaluation of the lumen is limited by the lack of intravenous contrast. Stable stent in the right brachiocephalic vein. Left internal jugular catheter tip within the right atrium. Mediastinum/Nodes: No enlarged mediastinal or axillary lymph nodes. Numerous subcentimeter mediastinal lymph nodes are stable. Thyroid gland, trachea, and esophagus demonstrate no significant findings. Lungs/Pleura: Dependent atelectasis within the bilateral lower lobes. Mild background emphysema. No acute airspace disease, effusion, or pneumothorax. Central airways  are patent. Upper Abdomen: Chronic renal cysts are again noted. Diffuse renal atrophy. No acute upper abdominal findings. Musculoskeletal: No acute or destructive bony lesions. Reconstructed images demonstrate no additional findings. IMPRESSION: 1. Minimal dependent lower lobe atelectasis. No acute airspace disease. 2. Aortic Atherosclerosis (ICD10-I70.0) and Emphysema (ICD10-J43.9). 3. Chronic bilateral renal cysts. Electronically Signed   By: Randa Ngo M.D.   On: 10/25/2019 19:37   CARDIAC CATHETERIZATION  Result Date: 10/25/2019  Ost 1st Diag lesion is 50% stenosed.  Prox LAD lesion is 100% stenosed.  Ost 1st Mrg to 1st Mrg lesion is 50% stenosed.  Previously placed Mid RCA to Dist RCA drug eluting stent is widely patent.  Balloon angioplasty was performed.  Previously placed Prox RCA to Mid RCA drug eluting stent is widely patent.  Balloon angioplasty was performed.  Mid RCA lesion is 100% stenosed.  Ost Cx to Prox Cx lesion is 90% stenosed.  LV end diastolic pressure is mildly elevated.  1. Severe 3 vessel CAD    - CTO of the mid LAD after the first diagonal    - 90% ostial LCx    - 100% mid RCA in stent. Left to right collaterals. 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 3. Atretic LIMA graft 4. Mildly elevated LVEDP Plan: very difficult situation. Fairly early restenosis/occlusion of the RCA. This vessel is diffusely diseased and heavily calcified. The ostial LCx appears new compared to prior. Percutaneous options are very limited. Repeat PCI of the RCA is likely not to stay open long. Would consider whether he is a candidate for redo CABG.   VAS US DOPPLER PRE CABG  Result Date: 10/26/2019 PREOPERATIVE VASCULAR EVALUATION  Indications:            Pre-CABG. Vascular Interventions: Multiple bilateral arm fistulass. Open heart surgery                         years ago. Limitations:            Multiple bilateral arm fisutlas, body habitus, poor                          ultrasound/tissue interface. Comparison Study:       ABI 07-14-19 Performing Technologist:  Baldwin Crown RDMS  Examination Guidelines: A complete evaluation includes B-mode imaging, spectral Doppler, color Doppler, and power Doppler as needed of all accessible portions of each vessel. Bilateral testing is considered an integral part of a complete examination. Limited examinations for reoccurring indications may be performed as noted.  Right Carotid Findings: +----------+--------+--------+--------+-------------------------+--------+           PSV cm/sEDV cm/sStenosisDescribe                 Comments +----------+--------+--------+--------+-------------------------+--------+ CCA Prox  67      14                                                +----------+--------+--------+--------+-------------------------+--------+ CCA Distal68      14                                                +----------+--------+--------+--------+-------------------------+--------+ ICA Prox  69      15      1-39%   heterogenous and calcific         +----------+--------+--------+--------+-------------------------+--------+ ICA Distal75      20                                                +----------+--------+--------+--------+-------------------------+--------+ ECA       75      9                                                 +----------+--------+--------+--------+-------------------------+--------+ Portions of this table do not appear on this page. +----------+--------+-------+----------------+------------+           PSV cm/sEDV cmsDescribe        Arm Pressure +----------+--------+-------+----------------+------------+ Subclavian119            Multiphasic, WNL             +----------+--------+-------+----------------+------------+ +---------+--------+--+--------+--+---------+ VertebralPSV cm/s42EDV cm/s12Antegrade +---------+--------+--+--------+--+---------+ Left Carotid  Findings: +----------+--------+--------+--------+-------------------------+--------+           PSV cm/sEDV cm/sStenosisDescribe                 Comments +----------+--------+--------+--------+-------------------------+--------+ CCA Prox  93      15                                                +----------+--------+--------+--------+-------------------------+--------+ CCA Distal65      14                                                +----------+--------+--------+--------+-------------------------+--------+ ICA Prox  108     22      1-39%   heterogenous and calcific         +----------+--------+--------+--------+-------------------------+--------+ ICA Distal80  15                                                +----------+--------+--------+--------+-------------------------+--------+ ECA       80      14                                                +----------+--------+--------+--------+-------------------------+--------+ +----------+--------+--------+----------------+------------+ SubclavianPSV cm/sEDV cm/sDescribe        Arm Pressure +----------+--------+--------+----------------+------------+           139             Multiphasic, WNL             +----------+--------+--------+----------------+------------+ +---------+--------+--+--------+--+---------+ VertebralPSV cm/s54EDV cm/s10Antegrade +---------+--------+--+--------+--+---------+  ABI Findings: +--------+------------------+-----+----------+-------------+ Right   Rt Pressure (mmHg)IndexWaveform  Comment       +--------+------------------+-----+----------+-------------+ Brachial                       triphasic UTO due to IV +--------+------------------+-----+----------+-------------+ PTA                            monophasic              +--------+------------------+-----+----------+-------------+ DP                             monophasic               +--------+------------------+-----+----------+-------------+ +--------+------------------+-----+-------------------+------------------------+ Left    Lt Pressure (mmHg)IndexWaveform           Comment                  +--------+------------------+-----+-------------------+------------------------+ Brachial                       triphasic          UTO due to multiple                                                        fistulas and patient                                                       declined                 +--------+------------------+-----+-------------------+------------------------+ PTA                            monophasic                                  +--------+------------------+-----+-------------------+------------------------+ DP  dampened monophasic                         +--------+------------------+-----+-------------------+------------------------+  Right Doppler Findings: +--------+--------+-----+----------+-------------+ Site    PressureIndexDoppler   Comments      +--------+--------+-----+----------+-------------+ Brachial             triphasic UTO due to IV +--------+--------+-----+----------+-------------+ Radial               monophasic              +--------+--------+-----+----------+-------------+ Ulnar                monophasic              +--------+--------+-----+----------+-------------+  Left Doppler Findings: +--------+--------+-----+---------+-------------------------------------------+ Site    PressureIndexDoppler  Comments                                    +--------+--------+-----+---------+-------------------------------------------+ Brachial             triphasicUTO due to multiple fistulas and patient                                  declined                                    +--------+--------+-----+---------+-------------------------------------------+ Radial                triphasic                                            +--------+--------+-----+---------+-------------------------------------------+ Ulnar                biphasic                                             +--------+--------+-----+---------+-------------------------------------------+  Summary: Right Carotid: Velocities in the right ICA are consistent with a 1-39% stenosis. Left Carotid: Velocities in the left ICA are consistent with a 1-39% stenosis. Vertebrals:  Bilateral vertebral arteries demonstrate antegrade flow. Subclavians: Normal flow hemodynamics were seen in bilateral subclavian              arteries. Right ABI: UTO due to IV. 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 Right Upper Extremity: Doppler waveforms decrease >50% with right radial compression. Doppler waveforms decrease >50% with right ulnar compression. Left Upper Extremity: Doppler waveforms decrease >50% with left radial compression. Doppler waveforms decrease <50% with left ulnar compression.     Preliminary      I have independently reviewed the above radiologic studies and discussed with the patient   Recent Lab Findings: Lab Results  Component Value Date   WBC 13.1 (H) 10/26/2019   HGB 10.8 (L) 10/26/2019   HCT 33.5 (L) 10/26/2019   PLT 164 10/26/2019   GLUCOSE 165 (H) 10/26/2019   CHOL 122 02/15/2019   TRIG 85 02/15/2019   HDL 46 02/15/2019   LDLCALC 59 02/15/2019   ALT 34 10/27/2018   AST 54 (H) 10/27/2018   NA 136 10/26/2019  K 5.0 10/26/2019   CL 95 (L) 10/26/2019   CREATININE 12.80 (H) 10/26/2019   BUN 70 (H) 10/26/2019   CO2 20 (L) 10/26/2019   TSH 5.113 (H) 05/25/2016   INR 1.2 10/29/2018   HGBA1C 6.4 (A) 06/07/2019      Assessment / Plan:      64 yo man with long-standing CAD and renal failure now with recurrent, highly symptomatic CAD, s/p NSTEMI. Plan redo sternotomy, redo CABG and possible AVR. I have discussed the risks and benefits of the procedure  with the patient and his family, and they wish to proceed.     I  spent 60 minutes counseling the patient face to face.   Letticia Bhattacharyya Z. Orvan Seen, MD [phone] 10/26/2019 4:55 PM

## 2019-10-26 NOTE — Progress Notes (Signed)
Nurse called HD to check on pt. Per HD pt is stable and  has about one more hour of HD.

## 2019-10-26 NOTE — Progress Notes (Signed)
Bipap is a PRN order.  Patient currently on 2L Patrick [name] with sats in upper 90s to 100%.  No distress noted, will continue to monitor.

## 2019-10-26 NOTE — Progress Notes (Signed)
[place] for heparin Indication: chest pain/ACS  Allergies  Allergen Reactions  . Baclofen Other (See Comments)    Possibly stroke like symptoms  . Iodinated Diagnostic Agents Swelling, Rash and Other (See Comments)    Other Reaction: burning to mouth, swelling of lips  . Lipitor [Atorvastatin] Other (See Comments)    Leg pain  . Metoprolol Other (See Comments)    Headaches     Patient Measurements: Height: 5' 6.5" (168.9 cm) Weight: 216 lb 4.8 oz (98.1 kg) IBW/kg (Calculated) : 64.95 Heparin Dosing Weight: 87.2kg  Vital Signs: Temp: 97.5 F (36.4 C) (03/31 0300) Temp Source: Oral (03/31 0300) BP: 102/39 (03/31 0635) Pulse Rate: 74 (03/31 0635)  Labs: Recent Labs    10/24/19 0737 10/24/19 0737 10/24/19 1911 10/25/19 0459 10/26/19 0802  HGB 8.6*   < >  --  13.3 10.8*  HCT 27.7*  --   --  41.6 33.5*  PLT 150  --   --  122* 164  HEPARINUNFRC  --   --  0.53 0.31 0.34  CREATININE 14.96*  --   --  10.11* 12.80*  TROPONINIHS 2,995*  --   --   --   --    < > = values in this interval not displayed.    Estimated Creatinine Clearance: 6.4 mL/min (A) (by C-G formula based on SCr of 12.8 mg/dL (H)).   Medical History: Past Medical History:  Diagnosis Date  . Arthritis   . Atrial flutter ([place])    Ablation 2018  . 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 Erectile dysfunction   . ESRD (end stage renal disease) on dialysis ([place])    Pt on HD 2003 >> transplanted in 2009, back on HD in 2016. [name].   . Essential hypertension   . Gout   . Hernia of abdominal wall   . History of blood transfusion   . History of cardiomyopathy   . History of kidney stones   . History of pneumonia   . Migraine   . Myocardial infarction ([place])    1996  . Pneumonia   .  Secondary hyperparathyroidism ([place])   . Type 2 diabetes mellitus ([place])   . Wears glasses     Medications:  Infusions:  . sodium chloride    . [START ON 10/27/2019] cefUROXime (ZINACEF)  IV    . [START ON 10/27/2019] cefUROXime (ZINACEF)  IV    . [START ON 10/27/2019] dexmedetomidine    . [START ON 10/27/2019] heparin 30,000 units/NS 1000 mL solution for CELLSAVER    . heparin 1,150 Units/hr (10/26/19 0124)  . [START ON 10/27/2019] milrinone    . nitroGLYCERIN 5 mcg/min (10/26/19 0641)  . [START ON 10/27/2019] nitroGLYCERIN    . [START ON 10/27/2019] norepinephrine    . [START ON 10/27/2019] tranexamic acid (CYKLOKAPRON) infusion ([facility])    . [START ON 10/27/2019] vancomycin      Assessment: 58 yom with ESRD on HD started on IV heparin for NSTEMI. He is not on anticoagulation PTA. Patient now s/p cath 3/30, and Cardiology recommending consideration of redo CABG. Pharmacy consulted to resume heparin 8 hours post-sheath removal (removed at 1628 per cath procedure log).  Heparin level at goal on 1150 units/hr of IV heparin this morning. Hgb trending down to 10.8 this am (  transfused PRBC on 3/29). Plt up to 164k. No active bleed issues reported.  Goal of Therapy:  Heparin level 0.3-0.7 units/ml Monitor platelets by anticoagulation protocol: Yes   Plan:  Continue heparin IV at 1150 units/hr  Monitor daily heparin level and CBC, s/sx bleeding Plans for surgery in am  Erin Hearing PharmD., BCPS Clinical Pharmacist 10/26/2019 9:50 AM

## 2019-10-27 ENCOUNTER — Inpatient Hospital Stay (HOSPITAL_COMMUNITY): Payer: Medicare HMO | Admitting: Certified Registered"

## 2019-10-27 ENCOUNTER — Encounter (HOSPITAL_COMMUNITY)
Admission: EM | Disposition: A | Discharge status: Nursing Home (Includes ICF) | Payer: Self-pay | Source: Home / Self Care | Attending: Cardiothoracic Surgery

## 2019-10-27 ENCOUNTER — Inpatient Hospital Stay (HOSPITAL_COMMUNITY): Payer: Medicare HMO

## 2019-10-27 DIAGNOSIS — Z951 Presence of aortocoronary bypass graft: Secondary | ICD-10-CM

## 2019-10-27 DIAGNOSIS — I351 Nonrheumatic aortic (valve) insufficiency: Secondary | ICD-10-CM

## 2019-10-27 DIAGNOSIS — I25110 Atherosclerotic heart disease of native coronary artery with unstable angina pectoris: Secondary | ICD-10-CM

## 2019-10-27 HISTORY — PX: AORTIC VALVE REPLACEMENT: SHX41

## 2019-10-27 HISTORY — PX: CORONARY ARTERY BYPASS GRAFT: SHX141

## 2019-10-27 HISTORY — PX: TEE WITHOUT CARDIOVERSION: SHX5443

## 2019-10-27 HISTORY — PX: ENDOVEIN HARVEST OF GREATER SAPHENOUS VEIN: SHX5059

## 2019-10-27 LAB — APTT
aPTT: >200 seconds (ref 24–36)
aPTT: >200 seconds (ref 24–36)

## 2019-10-27 LAB — PREPARE RBC (CROSSMATCH)

## 2019-10-27 LAB — POCT I-STAT 7, (LYTES, BLD GAS, ICA,H+H)
Acid-base deficit: 13 mmol/L — ABNORMAL HIGH (ref 0.0–2.0)
Acid-base deficit: 12 mmol/L — ABNORMAL HIGH (ref 0.0–2.0)
Acid-base deficit: 4 mmol/L — ABNORMAL HIGH (ref 0.0–2.0)
Bicarbonate: 15.1 mmol/L — ABNORMAL LOW (ref 20.0–28.0)
Bicarbonate: 16.9 mmol/L — ABNORMAL LOW (ref 20.0–28.0)
Bicarbonate: 22.9 mmol/L (ref 20.0–28.0)
Calcium, Ion: 0.86 mmol/L — CL (ref 1.15–1.40)
Calcium, Ion: 0.89 mmol/L — CL (ref 1.15–1.40)
Calcium, Ion: 0.81 mmol/L — CL (ref 1.15–1.40)
HCT: 26 % — ABNORMAL LOW (ref 39.0–52.0)
HCT: 22 % — ABNORMAL LOW (ref 39.0–52.0)
HCT: 19 % — ABNORMAL LOW (ref 39.0–52.0)
Hemoglobin: 8.8 g/dL — ABNORMAL LOW (ref 13.0–17.0)
Hemoglobin: 7.5 g/dL — ABNORMAL LOW (ref 13.0–17.0)
Hemoglobin: 6.5 g/dL — CL (ref 13.0–17.0)
O2 Saturation: 89 %
O2 Saturation: 89 %
O2 Saturation: 99 %
Patient temperature: 97.4
Patient temperature: 36.5
Patient temperature: 36.5
Potassium: 3.5 mmol/L (ref 3.5–5.1)
Potassium: 3.5 mmol/L (ref 3.5–5.1)
Potassium: 3.1 mmol/L — ABNORMAL LOW (ref 3.5–5.1)
Sodium: 144 mmol/L (ref 135–145)
Sodium: 143 mmol/L (ref 135–145)
Sodium: 145 mmol/L (ref 135–145)
TCO2: 16 mmol/L — ABNORMAL LOW (ref 22–32)
TCO2: 19 mmol/L — ABNORMAL LOW (ref 22–32)
TCO2: 25 mmol/L (ref 22–32)
pCO2 arterial: 41.6 mmHg (ref 32.0–48.0)
pCO2 arterial: 56.2 mmHg — ABNORMAL HIGH (ref 32.0–48.0)
pCO2 arterial: 53.3 mmHg — ABNORMAL HIGH (ref 32.0–48.0)
pH, Arterial: 7.163 — CL (ref 7.350–7.450)
pH, Arterial: 7.084 — CL (ref 7.350–7.450)
pH, Arterial: 7.238 — ABNORMAL LOW (ref 7.350–7.450)
pO2, Arterial: 69 mmHg — ABNORMAL LOW (ref 83.0–108.0)
pO2, Arterial: 77 mmHg — ABNORMAL LOW (ref 83.0–108.0)
pO2, Arterial: 192 mmHg — ABNORMAL HIGH (ref 83.0–108.0)

## 2019-10-27 LAB — GLUCOSE, CAPILLARY
Glucose-Capillary: 136 mg/dL — ABNORMAL HIGH (ref 70–99)
Glucose-Capillary: 177 mg/dL — ABNORMAL HIGH (ref 70–99)
Glucose-Capillary: 172 mg/dL — ABNORMAL HIGH (ref 70–99)
Glucose-Capillary: 174 mg/dL — ABNORMAL HIGH (ref 70–99)
Glucose-Capillary: 177 mg/dL — ABNORMAL HIGH (ref 70–99)
Glucose-Capillary: 152 mg/dL — ABNORMAL HIGH (ref 70–99)

## 2019-10-27 LAB — COMPREHENSIVE METABOLIC PANEL
ALT: 46 U/L — ABNORMAL HIGH (ref 0–44)
AST: 97 U/L — ABNORMAL HIGH (ref 15–41)
Albumin: 3.6 g/dL (ref 3.5–5.0)
Alkaline Phosphatase: 77 U/L (ref 38–126)
Anion gap: 20 — ABNORMAL HIGH (ref 5–15)
BUN: 46 mg/dL — ABNORMAL HIGH (ref 8–23)
CO2: 22 mmol/L (ref 22–32)
Calcium: 8.4 mg/dL — ABNORMAL LOW (ref 8.9–10.3)
Chloride: 98 mmol/L (ref 98–111)
Creatinine, Ser: 8.72 mg/dL — ABNORMAL HIGH (ref 0.61–1.24)
GFR calc Af Amer: 7 mL/min — ABNORMAL LOW (ref >60)
GFR calc non Af Amer: 6 mL/min — ABNORMAL LOW (ref >60)
Glucose, Bld: 133 mg/dL — ABNORMAL HIGH (ref 70–99)
Potassium: 4.4 mmol/L (ref 3.5–5.1)
Sodium: 140 mmol/L (ref 135–145)
Total Bilirubin: 1.1 mg/dL (ref 0.3–1.2)
Total Protein: 7.3 g/dL (ref 6.5–8.1)

## 2019-10-27 LAB — CBC
HCT: 38.9 % — ABNORMAL LOW (ref 39.0–52.0)
HCT: 31.6 % — ABNORMAL LOW (ref 39.0–52.0)
Hemoglobin: 12.5 g/dL — ABNORMAL LOW (ref 13.0–17.0)
Hemoglobin: 10.4 g/dL — ABNORMAL LOW (ref 13.0–17.0)
MCH: 31.9 pg (ref 26.0–34.0)
MCH: 31 pg (ref 26.0–34.0)
MCHC: 32.1 g/dL (ref 30.0–36.0)
MCHC: 32.9 g/dL (ref 30.0–36.0)
MCV: 99.2 fL (ref 80.0–100.0)
MCV: 94 fL (ref 80.0–100.0)
Platelets: 160 10*3/uL (ref 150–400)
Platelets: 93 10*3/uL — ABNORMAL LOW (ref 150–400)
RBC: 3.92 MIL/uL — ABNORMAL LOW (ref 4.22–5.81)
RBC: 3.36 MIL/uL — ABNORMAL LOW (ref 4.22–5.81)
RDW: 15.6 % — ABNORMAL HIGH (ref 11.5–15.5)
RDW: 16.6 % — ABNORMAL HIGH (ref 11.5–15.5)
WBC: 11.6 10*3/uL — ABNORMAL HIGH (ref 4.0–10.5)
WBC: 13.4 10*3/uL — ABNORMAL HIGH (ref 4.0–10.5)
nRBC: 0.2 % (ref 0.0–0.2)
nRBC: 1.9 % — ABNORMAL HIGH (ref 0.0–0.2)

## 2019-10-27 LAB — PLATELET COUNT: Platelets: 46 10*3/uL — ABNORMAL LOW (ref 150–400)

## 2019-10-27 LAB — PROTIME-INR
INR: 1.4 — ABNORMAL HIGH (ref 0.8–1.2)
INR: 2.3 — ABNORMAL HIGH (ref 0.8–1.2)
Prothrombin Time: 17.2 seconds — ABNORMAL HIGH (ref 11.4–15.2)
Prothrombin Time: 25 seconds — ABNORMAL HIGH (ref 11.4–15.2)

## 2019-10-27 LAB — SURGICAL PCR SCREEN
MRSA, PCR: NEGATIVE
Staphylococcus aureus: NEGATIVE

## 2019-10-27 LAB — HEMOGLOBIN A1C
Hgb A1c MFr Bld: 6.2 % — ABNORMAL HIGH (ref 4.8–5.6)
Mean Plasma Glucose: 131.24 mg/dL

## 2019-10-27 LAB — HEPARIN LEVEL (UNFRACTIONATED): Heparin Unfractionated: >2.2 IU/mL — ABNORMAL HIGH (ref 0.30–0.70)

## 2019-10-27 LAB — HEMOGLOBIN AND HEMATOCRIT, BLOOD
HCT: 23.4 % — ABNORMAL LOW (ref 39.0–52.0)
Hemoglobin: 8 g/dL — ABNORMAL LOW (ref 13.0–17.0)

## 2019-10-27 LAB — FIBRINOGEN: Fibrinogen: 216 mg/dL (ref 210–475)

## 2019-10-27 SURGERY — REDO CORONARY ARTERY BYPASS GRAFTING (CABG)
Anesthesia: General | Site: Leg Upper

## 2019-10-27 MED ORDER — VANCOMYCIN HCL IN DEXTROSE 1-5 GM/200ML-% IV SOLN
1000.0000 mg | Freq: Once | INTRAVENOUS | Status: AC
  Administered 2019-10-27: 23:00:00 1000 mg via INTRAVENOUS
  Filled 2019-10-27: qty 200

## 2019-10-27 MED ORDER — ASPIRIN 81 MG PO CHEW
324.0000 mg | CHEWABLE_TABLET | Freq: Every day | ORAL | Status: DC
  Administered 2019-10-28 – 2019-10-29 (×2): 324 mg
  Filled 2019-10-27 (×2): qty 4

## 2019-10-27 MED ORDER — 0.9 % SODIUM CHLORIDE (POUR BTL) OPTIME
TOPICAL | Status: DC | PRN
  Administered 2019-10-27: 5000 mL
  Administered 2019-10-27: 13:00:00 2000 mL

## 2019-10-27 MED ORDER — ARTIFICIAL TEARS OPHTHALMIC OINT
TOPICAL_OINTMENT | OPHTHALMIC | Status: DC | PRN
  Administered 2019-10-27: 1 via OPHTHALMIC

## 2019-10-27 MED ORDER — SODIUM BICARBONATE 8.4 % IV SOLN
100.0000 meq | Freq: Once | INTRAVENOUS | Status: AC
  Administered 2019-10-27: 100 meq via INTRAVENOUS

## 2019-10-27 MED ORDER — EPINEPHRINE PF 1 MG/ML IJ SOLN
INTRAVENOUS | Status: DC | PRN
  Administered 2019-10-27: 5 ug/min via INTRAVENOUS

## 2019-10-27 MED ORDER — EPINEPHRINE PF 1 MG/ML IJ SOLN
0.0000 ug/min | INTRAVENOUS | Status: DC
  Filled 2019-10-27 (×2): qty 4

## 2019-10-27 MED ORDER — ALBUMIN HUMAN 5 % IV SOLN
250.0000 mL | INTRAVENOUS | Status: AC | PRN
  Administered 2019-10-27 (×2): 12.5 g via INTRAVENOUS

## 2019-10-27 MED ORDER — MAGNESIUM SULFATE 4 GM/100ML IV SOLN
4.0000 g | Freq: Once | INTRAVENOUS | Status: DC
  Filled 2019-10-27: qty 100

## 2019-10-27 MED ORDER — SODIUM BICARBONATE 8.4 % IV SOLN
50.0000 meq | Freq: Once | INTRAVENOUS | Status: AC
  Administered 2019-10-27: 21:00:00 50 meq via INTRAVENOUS

## 2019-10-27 MED ORDER — LEVALBUTEROL HCL 0.63 MG/3ML IN NEBU: 0.6300 mg | INHALATION_SOLUTION | Freq: Four times a day (QID) | RESPIRATORY_TRACT | Status: DC

## 2019-10-27 MED ORDER — ACETAMINOPHEN 160 MG/5ML PO SOLN: 650.0000 mg | Freq: Once | ORAL | Status: AC

## 2019-10-27 MED ORDER — ESMOLOL HCL 100 MG/10ML IV SOLN
INTRAVENOUS | Status: AC
  Filled 2019-10-27: qty 10

## 2019-10-27 MED ORDER — POTASSIUM CHLORIDE 10 MEQ/50ML IV SOLN: 10.0000 meq | INTRAVENOUS | Status: AC

## 2019-10-27 MED ORDER — LACTATED RINGERS IV SOLN: INTRAVENOUS | Status: DC

## 2019-10-27 MED ORDER — CALCIUM CHLORIDE 10 % IV SOLN
INTRAVENOUS | Status: DC | PRN
  Administered 2019-10-27: 300 mg via INTRAVENOUS
  Administered 2019-10-27: 200 mg via INTRAVENOUS
  Administered 2019-10-27: 50 mg via INTRAVENOUS

## 2019-10-27 MED ORDER — PHENYLEPHRINE 40 MCG/ML (10ML) SYRINGE FOR IV PUSH (FOR BLOOD PRESSURE SUPPORT)
PREFILLED_SYRINGE | INTRAVENOUS | Status: AC
  Filled 2019-10-27: qty 10

## 2019-10-27 MED ORDER — SODIUM CHLORIDE 0.9% FLUSH
3.0000 mL | Freq: Two times a day (BID) | INTRAVENOUS | Status: DC
  Administered 2019-10-30: 22:00:00 10 mL via INTRAVENOUS
  Administered 2019-10-31 – 2019-11-03 (×8): 3 mL via INTRAVENOUS

## 2019-10-27 MED ORDER — HEPARIN SODIUM (PORCINE) 1000 UNIT/ML IJ SOLN
INTRAMUSCULAR | Status: DC | PRN
  Administered 2019-10-27: 34000 [IU] via INTRAVENOUS

## 2019-10-27 MED ORDER — HEMOSTATIC AGENTS (NO CHARGE) OPTIME
TOPICAL | Status: DC | PRN
  Administered 2019-10-27 (×5): 1 via TOPICAL

## 2019-10-27 MED ORDER — EPINEPHRINE HCL 5 MG/250ML IV SOLN IN NS
0.0000 ug/min | INTRAVENOUS | Status: DC
  Administered 2019-10-27: 6 ug/min via INTRAVENOUS
  Administered 2019-10-28 – 2019-10-29 (×3): 5 ug/min via INTRAVENOUS
  Administered 2019-10-29: 4 ug/min via INTRAVENOUS
  Filled 2019-10-27 (×4): qty 250

## 2019-10-27 MED ORDER — TRAMADOL HCL 50 MG PO TABS
50.0000 mg | ORAL_TABLET | ORAL | Status: DC | PRN
  Administered 2019-11-01: 50 mg via ORAL
  Administered 2019-11-05 – 2019-11-06 (×3): 100 mg via ORAL
  Administered 2019-11-10 (×2): 50 mg via ORAL
  Filled 2019-10-27: qty 1
  Filled 2019-10-27: qty 2
  Filled 2019-10-27 (×2): qty 1
  Filled 2019-10-27 (×2): qty 2

## 2019-10-27 MED ORDER — SODIUM BICARBONATE 8.4 % IV SOLN: 100.0000 meq | Freq: Once | INTRAVENOUS | Status: AC

## 2019-10-27 MED ORDER — PANTOPRAZOLE SODIUM 40 MG PO TBEC: 40.0000 mg | DELAYED_RELEASE_TABLET | Freq: Every day | ORAL | Status: DC

## 2019-10-27 MED ORDER — TRANEXAMIC ACID 1000 MG/10ML IV SOLN
INTRAVENOUS | Status: DC | PRN
  Administered 2019-10-27: 1.5 mg/kg/h via INTRAVENOUS

## 2019-10-27 MED ORDER — PHENYLEPHRINE HCL-NACL 20-0.9 MG/250ML-% IV SOLN: 0.0000 ug/min | INTRAVENOUS | Status: DC

## 2019-10-27 MED ORDER — SODIUM CHLORIDE 0.9% IV SOLUTION: Freq: Once | INTRAVENOUS | Status: AC

## 2019-10-27 MED ORDER — DOCUSATE SODIUM 100 MG PO CAPS: 200.0000 mg | ORAL_CAPSULE | Freq: Every day | ORAL | Status: DC

## 2019-10-27 MED ORDER — SODIUM CHLORIDE 0.9 % IV SOLN: INTRAVENOUS | Status: DC | PRN

## 2019-10-27 MED ORDER — ONDANSETRON HCL 4 MG/2ML IJ SOLN
4.0000 mg | Freq: Four times a day (QID) | INTRAMUSCULAR | Status: DC | PRN
  Administered 2019-10-30 – 2019-12-11 (×7): 4 mg via INTRAVENOUS
  Filled 2019-10-27 (×6): qty 2

## 2019-10-27 MED ORDER — SODIUM BICARBONATE 8.4 % IV SOLN
INTRAVENOUS | Status: AC
  Administered 2019-10-27: 100 meq via INTRAVENOUS
  Filled 2019-10-27: qty 150

## 2019-10-27 MED ORDER — SODIUM CHLORIDE 0.9 % IV SOLN
1.0000 g | Freq: Once | INTRAVENOUS | Status: AC
  Administered 2019-10-28: 1 g via INTRAVENOUS
  Filled 2019-10-27: qty 10

## 2019-10-27 MED ORDER — LACTATED RINGERS IV SOLN: 500.0000 mL | Freq: Once | INTRAVENOUS | Status: DC | PRN

## 2019-10-27 MED ORDER — HEPARIN SODIUM (PORCINE) 1000 UNIT/ML IJ SOLN
INTRAMUSCULAR | Status: AC
  Filled 2019-10-27: qty 1

## 2019-10-27 MED ORDER — SODIUM CHLORIDE 0.9% FLUSH
10.0000 mL | Freq: Two times a day (BID) | INTRAVENOUS | Status: DC
  Administered 2019-10-27: 22:00:00 40 mL
  Administered 2019-10-29 – 2019-11-06 (×9): 10 mL

## 2019-10-27 MED ORDER — ETOMIDATE 2 MG/ML IV SOLN
INTRAVENOUS | Status: DC | PRN
  Administered 2019-10-27: 12 mg via INTRAVENOUS

## 2019-10-27 MED ORDER — FENTANYL CITRATE (PF) 250 MCG/5ML IJ SOLN
INTRAMUSCULAR | Status: AC
  Filled 2019-10-27: qty 20

## 2019-10-27 MED ORDER — SODIUM CHLORIDE 0.9 % IV SOLN: 250.0000 mL | INTRAVENOUS | Status: DC

## 2019-10-27 MED ORDER — ETOMIDATE 2 MG/ML IV SOLN
INTRAVENOUS | Status: AC
  Filled 2019-10-27: qty 10

## 2019-10-27 MED ORDER — MORPHINE SULFATE (PF) 2 MG/ML IV SOLN
1.0000 mg | INTRAVENOUS | Status: DC | PRN
  Administered 2019-10-28: 2 mg via INTRAVENOUS
  Administered 2019-10-28: 4 mg via INTRAVENOUS
  Administered 2019-10-30: 2 mg via INTRAVENOUS
  Filled 2019-10-27 (×2): qty 1
  Filled 2019-10-27: qty 2

## 2019-10-27 MED ORDER — ORAL CARE MOUTH RINSE
15.0000 mL | OROMUCOSAL | Status: DC
  Administered 2019-10-27 – 2019-10-30 (×22): 15 mL via OROMUCOSAL

## 2019-10-27 MED ORDER — PLASMA-LYTE 148 IV SOLN
INTRAVENOUS | Status: DC | PRN
  Administered 2019-10-27: 500 mL

## 2019-10-27 MED ORDER — SODIUM BICARBONATE-DEXTROSE 150-5 MEQ/L-% IV SOLN
150.0000 meq | INTRAVENOUS | Status: DC
  Filled 2019-10-27: qty 1000

## 2019-10-27 MED ORDER — LEVALBUTEROL TARTRATE 45 MCG/ACT IN AERO: 2.0000 | INHALATION_SPRAY | Freq: Four times a day (QID) | RESPIRATORY_TRACT | Status: DC

## 2019-10-27 MED ORDER — INSULIN REGULAR(HUMAN) IN NACL 100-0.9 UT/100ML-% IV SOLN
INTRAVENOUS | Status: DC
  Administered 2019-10-28: 2.8 [IU]/h via INTRAVENOUS
  Administered 2019-10-29: 22:00:00 3 [IU]/h via INTRAVENOUS
  Filled 2019-10-27 (×2): qty 100

## 2019-10-27 MED ORDER — PROPOFOL 10 MG/ML IV BOLUS
INTRAVENOUS | Status: DC | PRN
  Administered 2019-10-27: 110 mg via INTRAVENOUS

## 2019-10-27 MED ORDER — CHLORHEXIDINE GLUCONATE CLOTH 2 % EX PADS
6.0000 | MEDICATED_PAD | Freq: Every day | CUTANEOUS | Status: DC
  Administered 2019-10-28 – 2019-10-29 (×2): 6 via TOPICAL

## 2019-10-27 MED ORDER — EPHEDRINE 5 MG/ML INJ
INTRAVENOUS | Status: AC
  Filled 2019-10-27: qty 10

## 2019-10-27 MED ORDER — FENTANYL CITRATE (PF) 100 MCG/2ML IJ SOLN
INTRAMUSCULAR | Status: DC | PRN
  Administered 2019-10-27: 150 ug via INTRAVENOUS
  Administered 2019-10-27: 50 ug via INTRAVENOUS
  Administered 2019-10-27 (×4): 150 ug via INTRAVENOUS

## 2019-10-27 MED ORDER — SODIUM CHLORIDE 0.9 % IV SOLN
20.0000 ug | INTRAVENOUS | Status: AC
  Administered 2019-10-27: 20 ug via INTRAVENOUS
  Filled 2019-10-27: qty 5

## 2019-10-27 MED ORDER — INSULIN REGULAR(HUMAN) IN NACL 100-0.9 UT/100ML-% IV SOLN
INTRAVENOUS | Status: DC | PRN
  Administered 2019-10-27: 3.2 [IU]/h via INTRAVENOUS

## 2019-10-27 MED ORDER — PHENYLEPHRINE HCL-NACL 20-0.9 MG/250ML-% IV SOLN
INTRAVENOUS | Status: DC | PRN
  Administered 2019-10-27: 25 ug/min via INTRAVENOUS
  Administered 2019-10-27: 45 ug/min via INTRAVENOUS

## 2019-10-27 MED ORDER — ROCURONIUM BROMIDE 10 MG/ML (PF) SYRINGE
PREFILLED_SYRINGE | INTRAVENOUS | Status: AC
  Filled 2019-10-27: qty 10

## 2019-10-27 MED ORDER — NOREPINEPHRINE 4 MG/250ML-% IV SOLN
0.0000 ug/min | INTRAVENOUS | Status: DC
  Filled 2019-10-27: qty 250

## 2019-10-27 MED ORDER — SODIUM CHLORIDE 0.9 % IV SOLN: INTRAVENOUS | Status: DC

## 2019-10-27 MED ORDER — ARTIFICIAL TEARS OPHTHALMIC OINT
TOPICAL_OINTMENT | OPHTHALMIC | Status: AC
  Filled 2019-10-27: qty 3.5

## 2019-10-27 MED ORDER — ACETAMINOPHEN 160 MG/5ML PO SOLN
1000.0000 mg | Freq: Four times a day (QID) | ORAL | Status: AC
  Administered 2019-10-28 – 2019-11-01 (×14): 1000 mg
  Filled 2019-10-27 (×14): qty 40.6

## 2019-10-27 MED ORDER — MIDAZOLAM HCL 2 MG/2ML IJ SOLN
2.0000 mg | INTRAMUSCULAR | Status: DC | PRN
  Administered 2019-10-28 (×2): 2 mg via INTRAVENOUS
  Filled 2019-10-27 (×3): qty 2

## 2019-10-27 MED ORDER — NOREPINEPHRINE 16 MG/250ML-% IV SOLN
0.0000 ug/min | INTRAVENOUS | Status: DC
  Administered 2019-10-27: 35 ug/min via INTRAVENOUS
  Administered 2019-10-28: 20:00:00 20 ug/min via INTRAVENOUS
  Administered 2019-10-29: 19 ug/min via INTRAVENOUS
  Administered 2019-10-30: 09:00:00 8 ug/min via INTRAVENOUS
  Filled 2019-10-27: qty 500
  Filled 2019-10-27 (×2): qty 250

## 2019-10-27 MED ORDER — SODIUM CHLORIDE (PF) 0.9 % IJ SOLN
INTRAMUSCULAR | Status: AC
  Filled 2019-10-27: qty 10

## 2019-10-27 MED ORDER — CALCIUM CHLORIDE 10 % IV SOLN
INTRAVENOUS | Status: AC
  Filled 2019-10-27: qty 10

## 2019-10-27 MED ORDER — STERILE WATER FOR INJECTION IJ SOLN
INTRAMUSCULAR | Status: AC
  Filled 2019-10-27: qty 10

## 2019-10-27 MED ORDER — PROTAMINE SULFATE 10 MG/ML IV SOLN
INTRAVENOUS | Status: DC | PRN
  Administered 2019-10-27: 340 mg via INTRAVENOUS

## 2019-10-27 MED ORDER — SUCCINYLCHOLINE CHLORIDE 200 MG/10ML IV SOSY
PREFILLED_SYRINGE | INTRAVENOUS | Status: AC
  Filled 2019-10-27: qty 10

## 2019-10-27 MED ORDER — ACETAMINOPHEN 650 MG RE SUPP
650.0000 mg | Freq: Once | RECTAL | Status: AC
  Administered 2019-10-27: 650 mg via RECTAL

## 2019-10-27 MED ORDER — MIDAZOLAM HCL (PF) 10 MG/2ML IJ SOLN
INTRAMUSCULAR | Status: AC
  Filled 2019-10-27: qty 2

## 2019-10-27 MED ORDER — ROCURONIUM BROMIDE 10 MG/ML (PF) SYRINGE
PREFILLED_SYRINGE | INTRAVENOUS | Status: DC | PRN
  Administered 2019-10-27: 50 mg via INTRAVENOUS
  Administered 2019-10-27: 100 mg via INTRAVENOUS
  Administered 2019-10-27: 40 mg via INTRAVENOUS

## 2019-10-27 MED ORDER — DEXMEDETOMIDINE HCL IN NACL 400 MCG/100ML IV SOLN
INTRAVENOUS | Status: DC | PRN
  Administered 2019-10-27: .4 ug/kg/h via INTRAVENOUS

## 2019-10-27 MED ORDER — VASOPRESSIN 20 UNIT/ML IV SOLN
0.0400 [IU]/min | INTRAVENOUS | Status: DC
  Administered 2019-10-27 – 2019-10-30 (×6): 0.04 [IU]/min via INTRAVENOUS
  Administered 2019-10-31: 0.03 [IU]/min via INTRAVENOUS
  Filled 2019-10-27 (×7): qty 2

## 2019-10-27 MED ORDER — ASPIRIN EC 325 MG PO TBEC: 325.0000 mg | DELAYED_RELEASE_TABLET | Freq: Every day | ORAL | Status: DC

## 2019-10-27 MED ORDER — SODIUM BICARBONATE 8.4 % IV SOLN
INTRAVENOUS | Status: DC
  Filled 2019-10-27 (×2): qty 150

## 2019-10-27 MED ORDER — PHENYLEPHRINE 40 MCG/ML (10ML) SYRINGE FOR IV PUSH (FOR BLOOD PRESSURE SUPPORT)
PREFILLED_SYRINGE | INTRAVENOUS | Status: DC | PRN
  Administered 2019-10-27: 120 ug via INTRAVENOUS
  Administered 2019-10-27: 80 ug via INTRAVENOUS
  Administered 2019-10-27: 160 ug via INTRAVENOUS
  Administered 2019-10-27: 80 ug via INTRAVENOUS
  Administered 2019-10-27: 120 ug via INTRAVENOUS

## 2019-10-27 MED ORDER — BISACODYL 5 MG PO TBEC
10.0000 mg | DELAYED_RELEASE_TABLET | Freq: Every day | ORAL | Status: DC
  Administered 2019-11-02 – 2019-11-08 (×3): 10 mg via ORAL
  Filled 2019-10-27 (×7): qty 2

## 2019-10-27 MED ORDER — LIDOCAINE 2% (20 MG/ML) 5 ML SYRINGE
INTRAMUSCULAR | Status: DC | PRN
  Administered 2019-10-27: 80 mg via INTRAVENOUS

## 2019-10-27 MED ORDER — SODIUM CHLORIDE 0.9% FLUSH: 3.0000 mL | INTRAVENOUS | Status: DC | PRN

## 2019-10-27 MED ORDER — OXYCODONE HCL 5 MG PO TABS
5.0000 mg | ORAL_TABLET | ORAL | Status: DC | PRN
  Administered 2019-11-06 – 2019-11-16 (×2): 10 mg via ORAL
  Administered 2019-11-27 – 2019-11-29 (×3): 5 mg via ORAL
  Administered 2019-11-29 – 2019-12-02 (×3): 10 mg via ORAL
  Filled 2019-10-27: qty 1
  Filled 2019-10-27: qty 2
  Filled 2019-10-27: qty 1
  Filled 2019-10-27 (×4): qty 2
  Filled 2019-10-27: qty 1
  Filled 2019-10-27: qty 2

## 2019-10-27 MED ORDER — BISACODYL 10 MG RE SUPP: 10.0000 mg | Freq: Every day | RECTAL | Status: DC

## 2019-10-27 MED ORDER — SODIUM CHLORIDE 0.9 % IV SOLN
1500.0000 mg | INTRAVENOUS | Status: DC
  Filled 2019-10-27: qty 1.5

## 2019-10-27 MED ORDER — FAMOTIDINE IN NACL 20-0.9 MG/50ML-% IV SOLN
20.0000 mg | Freq: Two times a day (BID) | INTRAVENOUS | Status: AC
  Administered 2019-10-27 – 2019-10-28 (×2): 20 mg via INTRAVENOUS
  Filled 2019-10-27 (×3): qty 50

## 2019-10-27 MED ORDER — DEXTROSE 50 % IV SOLN
0.0000 mL | INTRAVENOUS | Status: DC | PRN
  Administered 2019-11-29: 14:00:00 25 mL via INTRAVENOUS
  Filled 2019-10-27: qty 50

## 2019-10-27 MED ORDER — MILRINONE LACTATE IN DEXTROSE 20-5 MG/100ML-% IV SOLN
INTRAVENOUS | Status: DC | PRN
  Administered 2019-10-27: .25 ug/kg/min via INTRAVENOUS

## 2019-10-27 MED ORDER — METOPROLOL TARTRATE 25 MG/10 ML ORAL SUSPENSION
12.5000 mg | Freq: Two times a day (BID) | ORAL | Status: DC
  Administered 2019-11-01 – 2019-11-02 (×2): 12.5 mg
  Filled 2019-10-27 (×2): qty 5

## 2019-10-27 MED ORDER — PHENYLEPHRINE CONCENTRATED 100MG/250ML (0.4 MG/ML) INFUSION SIMPLE
0.0000 ug/min | INTRAVENOUS | Status: DC
  Filled 2019-10-27: qty 250

## 2019-10-27 MED ORDER — METOPROLOL TARTRATE 12.5 MG HALF TABLET
12.5000 mg | ORAL_TABLET | Freq: Two times a day (BID) | ORAL | Status: DC
  Administered 2019-11-03 – 2019-11-06 (×4): 12.5 mg via ORAL
  Filled 2019-10-27 (×6): qty 1

## 2019-10-27 MED ORDER — STERILE WATER FOR INJECTION IJ SOLN
INTRAMUSCULAR | Status: DC | PRN
  Administered 2019-10-27: 13:00:00 10 mL
  Administered 2019-10-27: 1000 mL

## 2019-10-27 MED ORDER — DEXMEDETOMIDINE HCL IN NACL 400 MCG/100ML IV SOLN
0.0000 ug/kg/h | INTRAVENOUS | Status: DC
  Administered 2019-10-28: 23:00:00 0.7 ug/kg/h via INTRAVENOUS
  Administered 2019-10-28: 0.5 ug/kg/h via INTRAVENOUS
  Administered 2019-10-28 – 2019-10-29 (×3): 0.7 ug/kg/h via INTRAVENOUS
  Filled 2019-10-27 (×7): qty 100

## 2019-10-27 MED ORDER — FENTANYL CITRATE (PF) 250 MCG/5ML IJ SOLN
INTRAMUSCULAR | Status: AC
  Filled 2019-10-27: qty 5

## 2019-10-27 MED ORDER — SODIUM CHLORIDE 0.9% FLUSH
10.0000 mL | INTRAVENOUS | Status: DC | PRN
  Administered 2019-11-08: 10 mL

## 2019-10-27 MED ORDER — SODIUM CHLORIDE 0.9 % IV SOLN
1.5000 g | INTRAVENOUS | Status: DC
  Filled 2019-10-27: qty 1.5

## 2019-10-27 MED ORDER — HEPARIN SODIUM (PORCINE) 1000 UNIT/ML DIALYSIS
1000.0000 [IU] | INTRAMUSCULAR | Status: DC | PRN
  Administered 2019-10-27: 18:00:00 4000 [IU] via INTRAVENOUS_CENTRAL
  Administered 2019-10-28: 2100 [IU] via INTRAVENOUS_CENTRAL
  Filled 2019-10-27 (×2): qty 6
  Filled 2019-10-27: qty 4

## 2019-10-27 MED ORDER — HEPARIN SODIUM (PORCINE) 1000 UNIT/ML DIALYSIS: 1000.0000 [IU] | INTRAMUSCULAR | Status: DC | PRN

## 2019-10-27 MED ORDER — ALBUMIN HUMAN 5 % IV SOLN: INTRAVENOUS | Status: DC | PRN

## 2019-10-27 MED ORDER — CHLORHEXIDINE GLUCONATE 0.12 % MT SOLN
15.0000 mL | OROMUCOSAL | Status: AC
  Administered 2019-10-27: 15 mL via OROMUCOSAL

## 2019-10-27 MED ORDER — PROPOFOL 10 MG/ML IV BOLUS
INTRAVENOUS | Status: AC
  Filled 2019-10-27: qty 20

## 2019-10-27 MED ORDER — CHLORHEXIDINE GLUCONATE 0.12% ORAL RINSE (MEDLINE KIT)
15.0000 mL | Freq: Two times a day (BID) | OROMUCOSAL | Status: DC
  Administered 2019-10-27 – 2019-10-29 (×4): 15 mL via OROMUCOSAL

## 2019-10-27 MED ORDER — SUCCINYLCHOLINE CHLORIDE 20 MG/ML IJ SOLN
INTRAMUSCULAR | Status: DC | PRN
  Administered 2019-10-27: 120 mg via INTRAVENOUS

## 2019-10-27 MED ORDER — SODIUM CHLORIDE 0.45 % IV SOLN: INTRAVENOUS | Status: DC | PRN

## 2019-10-27 MED ORDER — LIDOCAINE 2% (20 MG/ML) 5 ML SYRINGE
INTRAMUSCULAR | Status: AC
  Filled 2019-10-27: qty 5

## 2019-10-27 MED ORDER — NOREPINEPHRINE 4 MG/250ML-% IV SOLN
INTRAVENOUS | Status: DC | PRN
  Administered 2019-10-27: 2 ug/min via INTRAVENOUS

## 2019-10-27 MED ORDER — METHYLENE BLUE 0.5 % INJ SOLN
190.0000 mg | Freq: Once | Status: AC
  Administered 2019-10-27: 190 mg via INTRAVENOUS
  Filled 2019-10-27 (×2): qty 38

## 2019-10-27 MED ORDER — MIDAZOLAM HCL 5 MG/5ML IJ SOLN
INTRAMUSCULAR | Status: DC | PRN
  Administered 2019-10-27: 4 mg via INTRAVENOUS
  Administered 2019-10-27: 2 mg via INTRAVENOUS
  Administered 2019-10-27: 4 mg via INTRAVENOUS

## 2019-10-27 MED ORDER — ACETAMINOPHEN 500 MG PO TABS: 1000.0000 mg | ORAL_TABLET | Freq: Four times a day (QID) | ORAL | Status: AC

## 2019-10-27 MED ORDER — NITROGLYCERIN IN D5W 200-5 MCG/ML-% IV SOLN: 0.0000 ug/min | INTRAVENOUS | Status: DC

## 2019-10-27 MED ORDER — METOPROLOL TARTRATE 5 MG/5ML IV SOLN
2.5000 mg | INTRAVENOUS | Status: DC | PRN
  Administered 2019-10-31: 5 mg via INTRAVENOUS
  Filled 2019-10-27: qty 5

## 2019-10-27 MED ORDER — VASOPRESSIN 20 UNIT/ML IV SOLN
0.0100 [IU]/min | INTRAVENOUS | Status: DC
  Filled 2019-10-27: qty 2

## 2019-10-27 MED ORDER — MILRINONE LACTATE IN DEXTROSE 20-5 MG/100ML-% IV SOLN
0.1250 ug/kg/min | INTRAVENOUS | Status: DC
  Filled 2019-10-27: qty 100

## 2019-10-27 SURGICAL SUPPLY — 171 items
ADAPTER CARDIO PERF ANTE/RETRO (ADAPTER) ×4 IMPLANT
BAG DECANTER FOR FLEXI CONT (MISCELLANEOUS) ×4 IMPLANT
BALLN IABP SENSA PLUS 8F 50CC (BALLOONS) ×4
BALLOON IABP SENS PLUS 8F 50CC (BALLOONS) IMPLANT
BASKET HEART (ORDER IN 25'S) (MISCELLANEOUS) ×1
BASKET HEART (ORDER IN 25S) (MISCELLANEOUS) ×3 IMPLANT
BATTERY MAXDRIVER (MISCELLANEOUS) ×1 IMPLANT
BENZOIN TINCTURE PRP APPL 2/3 (GAUZE/BANDAGES/DRESSINGS) ×3 IMPLANT
BLADE CLIPPER SURG (BLADE) ×3 IMPLANT
BLADE CORE FAN STRYKER (BLADE) ×1 IMPLANT
BLADE STERNUM SYSTEM 6 (BLADE) ×3 IMPLANT
BLADE SURG 11 STRL SS (BLADE) ×4 IMPLANT
BLADE SURG 15 STRL LF DISP TIS (BLADE) IMPLANT
BLADE SURG 15 STRL SS (BLADE) ×2
BLOOD HAEMOCONCENTR 700 MIDI (MISCELLANEOUS) ×1 IMPLANT
BNDG ELASTIC 4X5.8 VLCR STR LF (GAUZE/BANDAGES/DRESSINGS) ×4 IMPLANT
BNDG ELASTIC 6X5.8 VLCR STR LF (GAUZE/BANDAGES/DRESSINGS) ×4 IMPLANT
BNDG GAUZE ELAST 4 BULKY (GAUZE/BANDAGES/DRESSINGS) ×4 IMPLANT
CANISTER SUCT 3000ML PPV (MISCELLANEOUS) ×4 IMPLANT
CANISTER WOUNDNEG PRESSURE 500 (CANNISTER) ×1 IMPLANT
CANNULA EZ GLIDE AORTIC 21FR (CANNULA) ×3 IMPLANT
CANNULA GUNDRY RCSP 15FR (MISCELLANEOUS) ×4 IMPLANT
CANNULA NON VENT 20FR 12 (CANNULA) ×1 IMPLANT
CANNULA SUMP PERICARDIAL (CANNULA) ×1 IMPLANT
CATH CPB KIT HENDRICKSON (MISCELLANEOUS) ×4 IMPLANT
CATH CPB KIT OWEN (MISCELLANEOUS) ×4 IMPLANT
CATH HEART VENT LEFT (CATHETERS) ×3 IMPLANT
CATH RETROPLEGIA CORONARY 14FR (CATHETERS) ×1 IMPLANT
CATH ROBINSON RED A/P 18FR (CATHETERS) ×9 IMPLANT
CATH THORACIC 36FR RT ANG (CATHETERS) ×3 IMPLANT
CLIP RETRACTION 3.0MM CORONARY (MISCELLANEOUS) ×4 IMPLANT
CLIP VESOCCLUDE MED 24/CT (CLIP) ×1 IMPLANT
CLIP VESOCCLUDE SM WIDE 24/CT (CLIP) ×3 IMPLANT
CNTNR URN SCR LID CUP LEK RST (MISCELLANEOUS) IMPLANT
CONN ST 1/4X3/8  BEN (MISCELLANEOUS) ×3
CONN ST 1/4X3/8 BEN (MISCELLANEOUS) IMPLANT
CONT SPEC 4OZ STRL OR WHT (MISCELLANEOUS) ×1
COUNTER NEEDLE 20 DBL MAG RED (NEEDLE) ×1 IMPLANT
COVER MAYO STAND STRL (DRAPES) ×2 IMPLANT
COVER SURGICAL LIGHT HANDLE (MISCELLANEOUS) ×3 IMPLANT
DEFOGGER ANTIFOG KIT (MISCELLANEOUS) ×1 IMPLANT
DERMABOND ADVANCED (GAUZE/BANDAGES/DRESSINGS) ×1
DERMABOND ADVANCED .7 DNX12 (GAUZE/BANDAGES/DRESSINGS) ×3 IMPLANT
DRAIN CHANNEL 28F RND 3/8 FF (WOUND CARE) ×13 IMPLANT
DRAIN CHANNEL 32F RND 10.7 FF (WOUND CARE) ×3 IMPLANT
DRAPE CARDIOVASCULAR INCISE (DRAPES) ×1
DRAPE CV SPLIT W-CLR ANES SCRN (DRAPES) IMPLANT
DRAPE EXTREMITY T 121X128X90 (DISPOSABLE) ×1 IMPLANT
DRAPE HALF SHEET 40X57 (DRAPES) ×1 IMPLANT
DRAPE INCISE IOBAN 66X45 STRL (DRAPES) ×6 IMPLANT
DRAPE PERI GROIN 82X75IN TIB (DRAPES) IMPLANT
DRAPE SLUSH/WARMER DISC (DRAPES) ×4 IMPLANT
DRAPE SRG 135X102X78XABS (DRAPES) ×3 IMPLANT
DRESSING PREVENA PLUS CUSTOM (GAUZE/BANDAGES/DRESSINGS) IMPLANT
DRSG AQUACEL AG ADV 3.5X14 (GAUZE/BANDAGES/DRESSINGS) ×4 IMPLANT
DRSG PREVENA PLUS CUSTOM (GAUZE/BANDAGES/DRESSINGS) ×4
ELECT CAUTERY BLADE 6.4 (BLADE) ×3 IMPLANT
ELECT REM PT RETURN 9FT ADLT (ELECTROSURGICAL) ×8
ELECTRODE REM PT RTRN 9FT ADLT (ELECTROSURGICAL) ×6 IMPLANT
FELT TEFLON 1X6 (MISCELLANEOUS) ×7 IMPLANT
GAUZE SPONGE 4X4 12PLY STRL (GAUZE/BANDAGES/DRESSINGS) ×8 IMPLANT
GAUZE SPONGE 4X4 12PLY STRL LF (GAUZE/BANDAGES/DRESSINGS) ×2 IMPLANT
GLOVE BIO SURGEON STRL SZ 6 (GLOVE) ×3 IMPLANT
GLOVE BIO SURGEON STRL SZ 6.5 (GLOVE) ×4 IMPLANT
GLOVE BIO SURGEON STRL SZ7 (GLOVE) IMPLANT
GLOVE BIO SURGEON STRL SZ7.5 (GLOVE) IMPLANT
GLOVE BIOGEL PI IND STRL 6 (GLOVE) IMPLANT
GLOVE BIOGEL PI IND STRL 6.5 (GLOVE) IMPLANT
GLOVE BIOGEL PI IND STRL 7.5 (GLOVE) IMPLANT
GLOVE BIOGEL PI IND STRL 8 (GLOVE) IMPLANT
GLOVE BIOGEL PI IND STRL 8.5 (GLOVE) IMPLANT
GLOVE BIOGEL PI INDICATOR 6 (GLOVE) ×5
GLOVE BIOGEL PI INDICATOR 6.5 (GLOVE) ×2
GLOVE BIOGEL PI INDICATOR 7.5 (GLOVE) ×2
GLOVE BIOGEL PI INDICATOR 8 (GLOVE) ×1
GLOVE BIOGEL PI INDICATOR 8.5 (GLOVE) ×1
GLOVE ECLIPSE 8.0 STRL XLNG CF (GLOVE) ×1 IMPLANT
GLOVE NEODERM STRL 7.5 LF PF (GLOVE) ×9 IMPLANT
GLOVE ORTHO TXT STRL SZ7.5 (GLOVE) ×12 IMPLANT
GLOVE SURG NEODERM 7.5  LF PF (GLOVE) ×4
GLOVE SURG SS PI 7.5 STRL IVOR (GLOVE) ×4 IMPLANT
GOWN STRL REUS W/ TWL LRG LVL3 (GOWN DISPOSABLE) ×12 IMPLANT
GOWN STRL REUS W/ TWL XL LVL3 (GOWN DISPOSABLE) IMPLANT
GOWN STRL REUS W/TWL LRG LVL3 (GOWN DISPOSABLE) ×12
GOWN STRL REUS W/TWL XL LVL3 (GOWN DISPOSABLE) ×1
HEMOSTAT POWDER SURGIFOAM 1G (HEMOSTASIS) ×2 IMPLANT
HEMOSTAT SURGICEL 2X14 (HEMOSTASIS) ×3 IMPLANT
INSERT FOGARTY XLG (MISCELLANEOUS) ×4 IMPLANT
IV ADAPTER SYR DOUBLE MALE LL (MISCELLANEOUS) ×1 IMPLANT
KIT BASIN OR (CUSTOM PROCEDURE TRAY) ×4 IMPLANT
KIT CATH LUMEN 2 9FR 11.5CM ST (CATHETERS) ×1 IMPLANT
KIT SUCTION CATH 14FR (SUCTIONS) ×10 IMPLANT
KIT TURNOVER KIT B (KITS) ×4 IMPLANT
KIT VASOVIEW HEMOPRO 2 VH 4000 (KITS) ×4 IMPLANT
LEAD PACING MYOCARDI (MISCELLANEOUS) ×3 IMPLANT
LINE VENT (MISCELLANEOUS) ×1 IMPLANT
MARKER GRAFT CORONARY BYPASS (MISCELLANEOUS) ×11 IMPLANT
MARKER SKIN DUAL TIP RULER LAB (MISCELLANEOUS) ×1 IMPLANT
NDL 18GX1X1/2 (RX/OR ONLY) (NEEDLE) ×3 IMPLANT
NEEDLE 18GX1X1/2 (RX/OR ONLY) (NEEDLE) ×8 IMPLANT
NS IRRIG 1000ML POUR BTL (IV SOLUTION) ×22 IMPLANT
PACK E OPEN HEART (SUTURE) ×4 IMPLANT
PACK OPEN HEART (CUSTOM PROCEDURE TRAY) ×4 IMPLANT
PACK SPY-PHI (KITS) ×1 IMPLANT
PAD ARMBOARD 7.5X6 YLW CONV (MISCELLANEOUS) ×8 IMPLANT
PAD ELECT DEFIB RADIOL ZOLL (MISCELLANEOUS) ×4 IMPLANT
PENCIL BUTTON HOLSTER BLD 10FT (ELECTRODE) ×5 IMPLANT
PLATE STERNAL 2.3X208 14H 2-PK (Plate) ×1 IMPLANT
POSITIONER HEAD DONUT 9IN (MISCELLANEOUS) ×4 IMPLANT
POWDER SURGICEL 3.0 GRAM (HEMOSTASIS) ×2 IMPLANT
PUNCH AORTIC ROT 4.0MM RCL 40 (MISCELLANEOUS) ×1 IMPLANT
SCREW LOCKING TI 2.3X13MM (Screw) ×2 IMPLANT
SCREW STERNAL LOCK 2.3MM (Screw) ×9 IMPLANT
SEALANT SURG COSEAL 8ML (VASCULAR PRODUCTS) ×1 IMPLANT
SET CARDIOPLEGIA MPS 5001102 (MISCELLANEOUS) ×1 IMPLANT
SET MONITOR QUICK PRESSURE (MISCELLANEOUS) ×1 IMPLANT
SPONGE LAP 18X18 X RAY DECT (DISPOSABLE) ×1 IMPLANT
STAPLER VISISTAT 35W (STAPLE) ×1 IMPLANT
STOPCOCK 4 WAY LG BORE MALE ST (IV SETS) ×1 IMPLANT
SUPPORT HEART JANKE-BARRON (MISCELLANEOUS) ×1 IMPLANT
SUT BONE WAX W31G (SUTURE) ×5 IMPLANT
SUT ETHIBON 2 0 V 52N 30 (SUTURE) ×6 IMPLANT
SUT ETHIBON EXCEL 2-0 V-5 (SUTURE) IMPLANT
SUT ETHIBOND 2 0 SH (SUTURE)
SUT ETHIBOND 2 0 SH 36X2 (SUTURE) IMPLANT
SUT ETHIBOND 2 0 V4 (SUTURE) IMPLANT
SUT ETHIBOND 2 0V4 GREEN (SUTURE) IMPLANT
SUT ETHIBOND 4 0 RB 1 (SUTURE) IMPLANT
SUT ETHIBOND V-5 VALVE (SUTURE) IMPLANT
SUT ETHIBOND X763 2 0 SH 1 (SUTURE) ×15 IMPLANT
SUT MNCRL AB 3-0 PS2 18 (SUTURE) ×8 IMPLANT
SUT MNCRL AB 4-0 PS2 18 (SUTURE) ×1 IMPLANT
SUT PDS AB 1 CTX 36 (SUTURE) ×8 IMPLANT
SUT PROLENE 3 0 SH DA (SUTURE) ×6 IMPLANT
SUT PROLENE 4 0 RB 1 (SUTURE) ×2
SUT PROLENE 4 0 SH DA (SUTURE) ×6 IMPLANT
SUT PROLENE 4-0 RB1 .5 CRCL 36 (SUTURE) ×6 IMPLANT
SUT PROLENE 5 0 C 1 36 (SUTURE) IMPLANT
SUT PROLENE 6 0 C 1 30 (SUTURE) ×11 IMPLANT
SUT PROLENE 7 0 BV 1 (SUTURE) ×1 IMPLANT
SUT PROLENE 7 0 BV1 MDA (SUTURE) ×2 IMPLANT
SUT PROLENE 8 0 BV175 6 (SUTURE) IMPLANT
SUT PROLENE BLUE 7 0 (SUTURE) ×4 IMPLANT
SUT SILK  1 MH (SUTURE) ×2
SUT SILK 1 MH (SUTURE) ×3 IMPLANT
SUT SILK 2 0 SH CR/8 (SUTURE) ×1 IMPLANT
SUT SILK 3 0 SH CR/8 (SUTURE) IMPLANT
SUT STEEL 6MS V (SUTURE) ×4 IMPLANT
SUT STEEL SZ 6 DBL 3X14 BALL (SUTURE) ×4 IMPLANT
SUT VIC AB 2-0 CT1 27 (SUTURE) ×1
SUT VIC AB 2-0 CT1 TAPERPNT 27 (SUTURE) IMPLANT
SUT VIC AB 2-0 CTX 27 (SUTURE) IMPLANT
SUT VIC AB 3-0 X1 27 (SUTURE) IMPLANT
SYR 10ML LL (SYRINGE) ×3 IMPLANT
SYR 30ML LL (SYRINGE) ×4 IMPLANT
SYR 3ML LL SCALE MARK (SYRINGE) ×4 IMPLANT
SYSTEM SAHARA CHEST DRAIN ATS (WOUND CARE) ×5 IMPLANT
TAPE CLOTH SURG 4X10 WHT LF (GAUZE/BANDAGES/DRESSINGS) ×1 IMPLANT
TAPE PAPER 2X10 WHT MICROPORE (GAUZE/BANDAGES/DRESSINGS) ×1 IMPLANT
TOWEL GREEN STERILE (TOWEL DISPOSABLE) ×4 IMPLANT
TOWEL GREEN STERILE FF (TOWEL DISPOSABLE) ×4 IMPLANT
TRAY FOLEY SLVR 16FR TEMP STAT (SET/KITS/TRAYS/PACK) ×4 IMPLANT
TUBING ART PRESS 48 MALE/FEM (TUBING) ×4 IMPLANT
TUBING ART PRESS 72  MALE/FEM (TUBING) ×2
TUBING ART PRESS 72 MALE/FEM (TUBING) IMPLANT
TUBING LAP HI FLOW INSUFFLATIO (TUBING) ×4 IMPLANT
UNDERPAD 30X30 (UNDERPADS AND DIAPERS) ×4 IMPLANT
VALVE SYSTEM EDWS INTUITY 23A (Prosthesis & Implant Heart) ×1 IMPLANT
VENT LEFT HEART 12002 (CATHETERS) ×4
WATER STERILE IRR 1000ML POUR (IV SOLUTION) ×2 IMPLANT
WATER STERILE IRR 1000ML UROMA (IV SOLUTION) ×1 IMPLANT

## 2019-10-27 NOTE — Anesthesia Preprocedure Evaluation (Addendum)
Anesthesia Evaluation  Patient identified by MRN, date of birth, ID band Patient awake    Reviewed: Allergy & Precautions, NPO status , Patient's Chart, lab work & pertinent test results  Airway Mallampati: II       Dental   Pulmonary pneumonia, former smoker,    breath sounds clear to auscultation       Cardiovascular hypertension, + angina + CAD, + Past MI, + Peripheral Vascular Disease and +CHF  + Valvular Problems/Murmurs  Rhythm:Regular Rate:Normal     Neuro/Psych  Headaches,    GI/Hepatic negative GI ROS, Neg liver ROS,   Endo/Other  diabetes  Renal/GU Renal disease     Musculoskeletal   Abdominal   Peds  Hematology   Anesthesia Other Findings   Reproductive/Obstetrics                            Anesthesia Physical Anesthesia Plan  ASA: III  Anesthesia Plan: General   Post-op Pain Management:    Induction: Intravenous  PONV Risk Score and Plan: 2 and Ondansetron  Airway Management Planned: Oral ETT  Additional Equipment:   Intra-op Plan:   Post-operative Plan: Post-operative intubation/ventilation  Informed Consent: I have reviewed the patients History and Physical, chart, labs and discussed the procedure including the risks, benefits and alternatives for the proposed anesthesia with the patient or authorized representative who has indicated his/her understanding and acceptance.     Dental advisory given  Plan Discussed with: Anesthesiologist and CRNA  Anesthesia Plan Comments:        Anesthesia Quick Evaluation

## 2019-10-27 NOTE — Progress Notes (Signed)
  Echocardiogram Echocardiogram Transesophageal has been performed.  Patrick [name] 10/27/2019, 3:42 PM

## 2019-10-27 NOTE — Progress Notes (Addendum)
FiO2 to 70% PEEP to 8 and rate to 18 per Dr. Orvan Seen at bedside based on ABG.  Per MD Milrinone decreased from 0.3 to 0.125, and will give 3 amps bicarb.  MD notified of all critical and pertinent lab values at this time.  Awaiting pRBCs and FFP to administer.

## 2019-10-27 NOTE — Anesthesia Procedure Notes (Addendum)
Procedure Name: Intubation Date/Time: 10/27/2019 9:55 AM Performed by: Kathryne Hitch, CRNA Pre-anesthesia Checklist: Patient identified, Emergency Drugs available, Suction available and Patient being monitored Patient Re-evaluated:Patient Re-evaluated prior to induction Oxygen Delivery Method: Circle system utilized Preoxygenation: Pre-oxygenation with 100% oxygen Induction Type: IV induction Ventilation: Mask ventilation without difficulty Laryngoscope Size: Glidescope and 4 Grade View: Grade I Tube type: Oral Tube size: 8.0 mm Number of attempts: 1 Airway Equipment and Method: Stylet and Oral airway Placement Confirmation: ETT inserted through vocal cords under direct vision,  positive ETCO2 and breath sounds checked- equal and bilateral Secured at: 23 cm Tube secured with: Tape Dental Injury: Teeth and Oropharynx as per pre-operative assessment

## 2019-10-27 NOTE — Progress Notes (Signed)
Blood bank called to ask how many units to have ready for patient procedure.  TCTS MD paged of request and stated to have 4 units for the morning and to stay ahead by 4 units.  Blood bank notified.

## 2019-10-27 NOTE — Anesthesia Procedure Notes (Addendum)
Arterial Line Insertion Start/End4/07/2019 7:40 AM, 10/27/2019 7:40 AM Performed by: Kathryne Hitch, CRNA, anesthesiologist  Patient location: Pre-op. Preanesthetic checklist: patient identified, IV checked, site marked, risks and benefits discussed, surgical consent, monitors and equipment checked, pre-op evaluation and timeout performed Lidocaine 1% used for infiltration Right, radial was placed Catheter size: 20 Fr Hand hygiene performed  and maximum sterile barriers used   Attempts: 3 Procedure performed using ultrasound guided technique. Ultrasound Notes:anatomy identified, needle tip was noted to be adjacent to the nerve/plexus identified and no ultrasound evidence of intravascular and/or intraneural injection Post procedure assessment: normal and unchanged  Additional procedure comments: Radial attempts unsuccessful by CRNA. See R brachial note .

## 2019-10-27 NOTE — Progress Notes (Signed)
   Vital Signs MEWS/VS Documentation      10/27/2019 0000 10/27/2019 0035 10/27/2019 0100 10/27/2019 0200   MEWS Score:  1  1  0  2   MEWS Score Color:  Green  Green  Green  Yellow   Resp:  20  18  18  14    BP:  (!) 88/43  (!) 91/50  (!) 103/44  (!) 80/43   Temp:  --  --  97.6 F (36.4 C)  --      MEWS of yellow is not acute for patient.  Patient has has low SBP.  Patient is on nitro drip and MD is aware of the low BP.  Patient asymptomatic.     [place] Lions 10/27/2019,2:29 AM

## 2019-10-27 NOTE — Brief Op Note (Signed)
10/24/2019 - 10/27/2019  2:45 PM  PATIENT:  Patrick [name] A [place]  64 y.o. male  PRE-OPERATIVE DIAGNOSIS:  CORONARY ARTERY DISEASE UNSTABLE ANGINA AORTIC INSUFFICIENCY  POST-OPERATIVE DIAGNOSIS:  CORONARY ARTERY DISEASE UNSTABLE ANGINA AORTIC INSUFFICIENCY   PROCEDURE:  Procedure(s): REDO CORONARY ARTERY BYPASS GRAFTING TIMES 3  USING LEFT GREATER SAPHENOUS LEG VEIN HARVESTED ENDOSCOPICALLY(CABG) (N/A) TRANSESOPHAGEAL ECHOCARDIOGRAM (TEE) (N/A) AORTIC VALVE REPLACEMENT (AVR) USING EDWARDS INTUITY 23 MM AORTIC VALVE. (N/A)  SURGEON:  Surgeon(s) and Role:    * Wonda Olds, MD - Primary  PHYSICIAN ASSISTANT: Meg Niemeier  ANESTHESIA:   general  EBL:  Per perfusion and anesthesia records.  BLOOD ADMINISTERED:  Multiple units PRBC's, Plts, FFP, and Cryo  DRAINS: Bilateral pleural tubes and mediastinal tubes x 2.   LOCAL MEDICATIONS USED:  NONE  SPECIMEN:  Source of Specimen:  Aortic valve leaflets  DISPOSITION OF SPECIMEN:  PATHOLOGY  COUNTS:  YES  DICTATION: .Dragon Dictation  PLAN OF CARE: Admit to inpatient   PATIENT DISPOSITION:  ICU - intubated and hemodynamically stable.   Delay start of Pharmacological VTE agent (>24hrs) due to surgical blood loss or risk of bleeding: yes

## 2019-10-27 NOTE — Anesthesia Procedure Notes (Signed)
Arterial Line Insertion Start/End4/07/2019 7:45 AM, 10/27/2019 8:00 AM Performed by: Belinda Block, MD  Preanesthetic checklist: patient identified, IV checked, site marked, risks and benefits discussed, surgical consent, monitors and equipment checked, pre-op evaluation and timeout performed Lidocaine 1% used for infiltration Right, brachial was placed Catheter size: 20 G Hand hygiene performed , maximum sterile barriers used  and Seldinger technique used Allen's test indicative of satisfactory collateral circulation Attempts: 1 Procedure performed using ultrasound guided technique. Ultrasound Notes:anatomy identified Following insertion, line sutured, dressing applied and Biopatch. Patient tolerated the procedure well with no immediate complications.

## 2019-10-27 NOTE — Progress Notes (Addendum)
Notified Cardiology MD about low BP.  Stated okay to leave nitro drip on unless patient becomes symptomatic with low BP.  Patient currently asymptomatic.

## 2019-10-27 NOTE — Transfer of Care (Signed)
Immediate Anesthesia Transfer of Care Note  Patient: Patrick [name]  Procedure(s) Performed: REDO CORONARY ARTERY BYPASS GRAFTING TIMES 3  USING LEFT GREATER SAPHENOUS LEG VEIN HARVESTED ENDOSCOPICALLY(CABG) (N/A Chest) TRANSESOPHAGEAL ECHOCARDIOGRAM (TEE) (N/A ) AORTIC VALVE REPLACEMENT (AVR) USING EDWARDS INTUITY 23 MM AORTIC VALVE. (N/A Chest) Endovein Harvest Of Greater Saphenous Vein (Left Leg Upper)  Patient Location: ICU  Anesthesia Type:General  Level of Consciousness: Patient remains intubated per anesthesia plan  Airway & Oxygen Therapy: Patient remains intubated per anesthesia plan and Patient placed on Ventilator (see vital sign flow sheet for setting)  Post-op Assessment: Report given to RN and Post -op Vital signs reviewed and stable  Post vital signs: Reviewed and stable  Last Vitals:  Vitals Value Taken Time  BP    Temp    Pulse    Resp    SpO2      Last Pain:  Vitals:   10/27/19 0300  TempSrc: Oral  PainSc:       Patients Stated Pain Goal: 0 ([id number])  Complications: No apparent anesthesia complications

## 2019-10-27 NOTE — Anesthesia Procedure Notes (Signed)
Central Venous Catheter Insertion Performed by: Belinda Block, MD Start/End4/07/2019 7:00 AM, 10/27/2019 7:15 AM Preanesthetic checklist: patient identified, IV checked, site marked, risks and benefits discussed, surgical consent, monitors and equipment checked, pre-op evaluation and timeout performed Position: Trendelenburg Lidocaine 1% used for infiltration and patient sedated Hand hygiene performed , maximum sterile barriers used  and Seldinger technique used Sheath introducer Procedure performed using ultrasound guided technique. Ultrasound Notes:anatomy identified Attempts: 2 Patient tolerated the procedure well with no immediate complications. Additional procedure comments: LIJ wire unable to thread. Anatomy discussed with Dr. Julien Girt who agreed to do Subclavian line intraop Belinda Block, MD.

## 2019-10-27 NOTE — Progress Notes (Addendum)
Late Entry  Patient arrived to [place] from OR at 3. BP very labile. Dr. Orvan Seen ordered 2 units cyro in OR that were to be administered upon arrival to unit - stated to give them "wide open." First unit transfused from 1817-1833 with 113 mls and second unit transfused from 1834-1844 with 120 mls. BP/HR remained consistent throughout transfusions. Temp 97.5. No complications noted.  Joellen Jersey, RN

## 2019-10-27 NOTE — Progress Notes (Signed)
Report called to [facility] with anesthesia.

## 2019-10-27 NOTE — H&P (Signed)
History and Physical Interval Note:  10/27/2019 7:10 AM  Patrick [name]  has presented today for surgery, with the diagnosis of Unstable angina.  The various methods of treatment have been discussed with the patient and family. After consideration of risks, benefits and other options for treatment, the patient has consented to  Procedure(s): REDO CORONARY ARTERY BYPASS GRAFTING (CABG) BILATERAL RADIAL ARTERY HARVESTING. (N/A) TRANSESOPHAGEAL ECHOCARDIOGRAM (TEE) (N/A) POSSIBLE AORTIC VALVE REPLACEMENT (AVR) (N/A) as a surgical intervention.  The patient's history has been reviewed, patient examined, no change in status, stable for surgery.  I have reviewed the patient's chart and labs.  Questions were answered to the patient's satisfaction.     Wonda Olds

## 2019-10-28 ENCOUNTER — Inpatient Hospital Stay (HOSPITAL_COMMUNITY): Payer: Medicare HMO

## 2019-10-28 ENCOUNTER — Encounter: Payer: Self-pay | Admitting: *Deleted

## 2019-10-28 DIAGNOSIS — Z951 Presence of aortocoronary bypass graft: Secondary | ICD-10-CM

## 2019-10-28 DIAGNOSIS — R570 Cardiogenic shock: Secondary | ICD-10-CM

## 2019-10-28 DIAGNOSIS — N186 End stage renal disease: Secondary | ICD-10-CM | POA: Diagnosis not present

## 2019-10-28 LAB — CBC
HCT: 37.5 % — ABNORMAL LOW (ref 39.0–52.0)
HCT: 35.2 % — ABNORMAL LOW (ref 39.0–52.0)
Hemoglobin: 12.8 g/dL — ABNORMAL LOW (ref 13.0–17.0)
Hemoglobin: 12.2 g/dL — ABNORMAL LOW (ref 13.0–17.0)
MCH: 30.2 pg (ref 26.0–34.0)
MCH: 30.5 pg (ref 26.0–34.0)
MCHC: 34.1 g/dL (ref 30.0–36.0)
MCHC: 34.7 g/dL (ref 30.0–36.0)
MCV: 88.4 fL (ref 80.0–100.0)
MCV: 88 fL (ref 80.0–100.0)
Platelets: 48 10*3/uL — ABNORMAL LOW (ref 150–400)
Platelets: 29 10*3/uL — CL (ref 150–400)
RBC: 4.24 MIL/uL (ref 4.22–5.81)
RBC: 4 MIL/uL — ABNORMAL LOW (ref 4.22–5.81)
RDW: 15.4 % (ref 11.5–15.5)
RDW: 16.5 % — ABNORMAL HIGH (ref 11.5–15.5)
WBC: 16.8 10*3/uL — ABNORMAL HIGH (ref 4.0–10.5)
WBC: 15.9 10*3/uL — ABNORMAL HIGH (ref 4.0–10.5)
nRBC: 4.7 % — ABNORMAL HIGH (ref 0.0–0.2)
nRBC: 3.1 % — ABNORMAL HIGH (ref 0.0–0.2)

## 2019-10-28 LAB — PROTIME-INR
INR: 1.4 — ABNORMAL HIGH (ref 0.8–1.2)
Prothrombin Time: 17 seconds — ABNORMAL HIGH (ref 11.4–15.2)

## 2019-10-28 LAB — POCT I-STAT 7, (LYTES, BLD GAS, ICA,H+H)
Acid-base deficit: 1 mmol/L (ref 0.0–2.0)
Acid-base deficit: 2 mmol/L (ref 0.0–2.0)
Acid-base deficit: 1 mmol/L (ref 0.0–2.0)
Acid-base deficit: 1 mmol/L (ref 0.0–2.0)
Acid-base deficit: 5 mmol/L — ABNORMAL HIGH (ref 0.0–2.0)
Acid-base deficit: 7 mmol/L — ABNORMAL HIGH (ref 0.0–2.0)
Acid-base deficit: 6 mmol/L — ABNORMAL HIGH (ref 0.0–2.0)
Acid-base deficit: 4 mmol/L — ABNORMAL HIGH (ref 0.0–2.0)
Bicarbonate: 26 mmol/L (ref 20.0–28.0)
Bicarbonate: 24.1 mmol/L (ref 20.0–28.0)
Bicarbonate: 24.5 mmol/L (ref 20.0–28.0)
Bicarbonate: 23.7 mmol/L (ref 20.0–28.0)
Bicarbonate: 23.2 mmol/L (ref 20.0–28.0)
Bicarbonate: 20.3 mmol/L (ref 20.0–28.0)
Bicarbonate: 20.3 mmol/L (ref 20.0–28.0)
Bicarbonate: 20.2 mmol/L (ref 20.0–28.0)
Bicarbonate: 22.6 mmol/L (ref 20.0–28.0)
Calcium, Ion: 1.09 mmol/L — ABNORMAL LOW (ref 1.15–1.40)
Calcium, Ion: 1.05 mmol/L — ABNORMAL LOW (ref 1.15–1.40)
Calcium, Ion: 0.87 mmol/L — CL (ref 1.15–1.40)
Calcium, Ion: 0.78 mmol/L — CL (ref 1.15–1.40)
Calcium, Ion: 0.76 mmol/L — CL (ref 1.15–1.40)
Calcium, Ion: 0.89 mmol/L — CL (ref 1.15–1.40)
Calcium, Ion: 1.01 mmol/L — ABNORMAL LOW (ref 1.15–1.40)
Calcium, Ion: 0.88 mmol/L — CL (ref 1.15–1.40)
Calcium, Ion: 0.96 mmol/L — ABNORMAL LOW (ref 1.15–1.40)
HCT: 33 % — ABNORMAL LOW (ref 39.0–52.0)
HCT: 25 % — ABNORMAL LOW (ref 39.0–52.0)
HCT: 18 % — ABNORMAL LOW (ref 39.0–52.0)
HCT: 21 % — ABNORMAL LOW (ref 39.0–52.0)
HCT: 26 % — ABNORMAL LOW (ref 39.0–52.0)
HCT: 33 % — ABNORMAL LOW (ref 39.0–52.0)
HCT: 34 % — ABNORMAL LOW (ref 39.0–52.0)
HCT: 32 % — ABNORMAL LOW (ref 39.0–52.0)
HCT: 34 % — ABNORMAL LOW (ref 39.0–52.0)
Hemoglobin: 11.2 g/dL — ABNORMAL LOW (ref 13.0–17.0)
Hemoglobin: 8.5 g/dL — ABNORMAL LOW (ref 13.0–17.0)
Hemoglobin: 6.1 g/dL — CL (ref 13.0–17.0)
Hemoglobin: 7.1 g/dL — ABNORMAL LOW (ref 13.0–17.0)
Hemoglobin: 8.8 g/dL — ABNORMAL LOW (ref 13.0–17.0)
Hemoglobin: 11.2 g/dL — ABNORMAL LOW (ref 13.0–17.0)
Hemoglobin: 11.6 g/dL — ABNORMAL LOW (ref 13.0–17.0)
Hemoglobin: 10.9 g/dL — ABNORMAL LOW (ref 13.0–17.0)
Hemoglobin: 11.6 g/dL — ABNORMAL LOW (ref 13.0–17.0)
O2 Saturation: 100 %
O2 Saturation: 100 %
O2 Saturation: 100 %
O2 Saturation: 100 %
O2 Saturation: 100 %
O2 Saturation: 100 %
O2 Saturation: 99 %
O2 Saturation: 97 %
O2 Saturation: 94 %
Patient temperature: 36.6
Patient temperature: 36.5
Patient temperature: 36.5
Potassium: 3.7 mmol/L (ref 3.5–5.1)
Potassium: 3.6 mmol/L (ref 3.5–5.1)
Potassium: 3.6 mmol/L (ref 3.5–5.1)
Potassium: 4.9 mmol/L (ref 3.5–5.1)
Potassium: 5.3 mmol/L — ABNORMAL HIGH (ref 3.5–5.1)
Potassium: 4.7 mmol/L (ref 3.5–5.1)
Potassium: 2.9 mmol/L — ABNORMAL LOW (ref 3.5–5.1)
Potassium: 3.2 mmol/L — ABNORMAL LOW (ref 3.5–5.1)
Potassium: 4.6 mmol/L (ref 3.5–5.1)
Sodium: 135 mmol/L (ref 135–145)
Sodium: 137 mmol/L (ref 135–145)
Sodium: 138 mmol/L (ref 135–145)
Sodium: 138 mmol/L (ref 135–145)
Sodium: 137 mmol/L (ref 135–145)
Sodium: 139 mmol/L (ref 135–145)
Sodium: 144 mmol/L (ref 135–145)
Sodium: 144 mmol/L (ref 135–145)
Sodium: 142 mmol/L (ref 135–145)
TCO2: 28 mmol/L (ref 22–32)
TCO2: 25 mmol/L (ref 22–32)
TCO2: 26 mmol/L (ref 22–32)
TCO2: 25 mmol/L (ref 22–32)
TCO2: 24 mmol/L (ref 22–32)
TCO2: 21 mmol/L — ABNORMAL LOW (ref 22–32)
TCO2: 22 mmol/L (ref 22–32)
TCO2: 21 mmol/L — ABNORMAL LOW (ref 22–32)
TCO2: 24 mmol/L (ref 22–32)
pCO2 arterial: 52.5 mmHg — ABNORMAL HIGH (ref 32.0–48.0)
pCO2 arterial: 45.8 mmHg (ref 32.0–48.0)
pCO2 arterial: 40.6 mmHg (ref 32.0–48.0)
pCO2 arterial: 37 mmHg (ref 32.0–48.0)
pCO2 arterial: 35.4 mmHg (ref 32.0–48.0)
pCO2 arterial: 36.4 mmHg (ref 32.0–48.0)
pCO2 arterial: 46.3 mmHg (ref 32.0–48.0)
pCO2 arterial: 38.9 mmHg (ref 32.0–48.0)
pCO2 arterial: 45.1 mmHg (ref 32.0–48.0)
pH, Arterial: 7.302 — ABNORMAL LOW (ref 7.350–7.450)
pH, Arterial: 7.329 — ABNORMAL LOW (ref 7.350–7.450)
pH, Arterial: 7.389 (ref 7.350–7.450)
pH, Arterial: 7.414 (ref 7.350–7.450)
pH, Arterial: 7.423 (ref 7.350–7.450)
pH, Arterial: 7.355 (ref 7.350–7.450)
pH, Arterial: 7.247 — ABNORMAL LOW (ref 7.350–7.450)
pH, Arterial: 7.321 — ABNORMAL LOW (ref 7.350–7.450)
pH, Arterial: 7.306 — ABNORMAL LOW (ref 7.350–7.450)
pO2, Arterial: 296 mmHg — ABNORMAL HIGH (ref 83.0–108.0)
pO2, Arterial: 471 mmHg — ABNORMAL HIGH (ref 83.0–108.0)
pO2, Arterial: 350 mmHg — ABNORMAL HIGH (ref 83.0–108.0)
pO2, Arterial: 383 mmHg — ABNORMAL HIGH (ref 83.0–108.0)
pO2, Arterial: 389 mmHg — ABNORMAL HIGH (ref 83.0–108.0)
pO2, Arterial: 226 mmHg — ABNORMAL HIGH (ref 83.0–108.0)
pO2, Arterial: 137 mmHg — ABNORMAL HIGH (ref 83.0–108.0)
pO2, Arterial: 91 mmHg (ref 83.0–108.0)
pO2, Arterial: 77 mmHg — ABNORMAL LOW (ref 83.0–108.0)

## 2019-10-28 LAB — TYPE AND SCREEN
ABO/RH(D): O POS
Antibody Screen: POSITIVE
Unit division: 0
Unit division: 0
Unit division: 0
Unit division: 0
Unit division: 0
Unit division: 0
Unit division: 0
Unit division: 0

## 2019-10-28 LAB — POCT I-STAT, CHEM 8
BUN: 44 mg/dL — ABNORMAL HIGH (ref 8–23)
BUN: 42 mg/dL — ABNORMAL HIGH (ref 8–23)
BUN: 45 mg/dL — ABNORMAL HIGH (ref 8–23)
BUN: 48 mg/dL — ABNORMAL HIGH (ref 8–23)
BUN: 47 mg/dL — ABNORMAL HIGH (ref 8–23)
BUN: 52 mg/dL — ABNORMAL HIGH (ref 8–23)
Calcium, Ion: 1.12 mmol/L — ABNORMAL LOW (ref 1.15–1.40)
Calcium, Ion: 1.08 mmol/L — ABNORMAL LOW (ref 1.15–1.40)
Calcium, Ion: 0.79 mmol/L — CL (ref 1.15–1.40)
Calcium, Ion: 0.76 mmol/L — CL (ref 1.15–1.40)
Calcium, Ion: 0.75 mmol/L — CL (ref 1.15–1.40)
Calcium, Ion: 0.89 mmol/L — CL (ref 1.15–1.40)
Chloride: 100 mmol/L (ref 98–111)
Chloride: 102 mmol/L (ref 98–111)
Chloride: 100 mmol/L (ref 98–111)
Chloride: 101 mmol/L (ref 98–111)
Chloride: 100 mmol/L (ref 98–111)
Chloride: 102 mmol/L (ref 98–111)
Creatinine, Ser: 9.7 mg/dL — ABNORMAL HIGH (ref 0.61–1.24)
Creatinine, Ser: 9.2 mg/dL — ABNORMAL HIGH (ref 0.61–1.24)
Creatinine, Ser: 7.6 mg/dL — ABNORMAL HIGH (ref 0.61–1.24)
Creatinine, Ser: 7.2 mg/dL — ABNORMAL HIGH (ref 0.61–1.24)
Creatinine, Ser: 6.9 mg/dL — ABNORMAL HIGH (ref 0.61–1.24)
Creatinine, Ser: 7 mg/dL — ABNORMAL HIGH (ref 0.61–1.24)
Glucose, Bld: 161 mg/dL — ABNORMAL HIGH (ref 70–99)
Glucose, Bld: 151 mg/dL — ABNORMAL HIGH (ref 70–99)
Glucose, Bld: 141 mg/dL — ABNORMAL HIGH (ref 70–99)
Glucose, Bld: 125 mg/dL — ABNORMAL HIGH (ref 70–99)
Glucose, Bld: 103 mg/dL — ABNORMAL HIGH (ref 70–99)
Glucose, Bld: 131 mg/dL — ABNORMAL HIGH (ref 70–99)
HCT: 34 % — ABNORMAL LOW (ref 39.0–52.0)
HCT: 26 % — ABNORMAL LOW (ref 39.0–52.0)
HCT: 25 % — ABNORMAL LOW (ref 39.0–52.0)
HCT: 25 % — ABNORMAL LOW (ref 39.0–52.0)
HCT: 24 % — ABNORMAL LOW (ref 39.0–52.0)
HCT: 32 % — ABNORMAL LOW (ref 39.0–52.0)
Hemoglobin: 11.6 g/dL — ABNORMAL LOW (ref 13.0–17.0)
Hemoglobin: 8.8 g/dL — ABNORMAL LOW (ref 13.0–17.0)
Hemoglobin: 8.5 g/dL — ABNORMAL LOW (ref 13.0–17.0)
Hemoglobin: 8.5 g/dL — ABNORMAL LOW (ref 13.0–17.0)
Hemoglobin: 8.2 g/dL — ABNORMAL LOW (ref 13.0–17.0)
Hemoglobin: 10.9 g/dL — ABNORMAL LOW (ref 13.0–17.0)
Potassium: 3.7 mmol/L (ref 3.5–5.1)
Potassium: 3.7 mmol/L (ref 3.5–5.1)
Potassium: 6 mmol/L — ABNORMAL HIGH (ref 3.5–5.1)
Potassium: 5.6 mmol/L — ABNORMAL HIGH (ref 3.5–5.1)
Potassium: 5.4 mmol/L — ABNORMAL HIGH (ref 3.5–5.1)
Potassium: 4.7 mmol/L (ref 3.5–5.1)
Sodium: 135 mmol/L (ref 135–145)
Sodium: 136 mmol/L (ref 135–145)
Sodium: 136 mmol/L (ref 135–145)
Sodium: 136 mmol/L (ref 135–145)
Sodium: 136 mmol/L (ref 135–145)
Sodium: 138 mmol/L (ref 135–145)
TCO2: 29 mmol/L (ref 22–32)
TCO2: 26 mmol/L (ref 22–32)
TCO2: 27 mmol/L (ref 22–32)
TCO2: 26 mmol/L (ref 22–32)
TCO2: 25 mmol/L (ref 22–32)
TCO2: 23 mmol/L (ref 22–32)

## 2019-10-28 LAB — RENAL FUNCTION PANEL
Albumin: 3 g/dL — ABNORMAL LOW (ref 3.5–5.0)
Anion gap: 17 — ABNORMAL HIGH (ref 5–15)
BUN: 31 mg/dL — ABNORMAL HIGH (ref 8–23)
CO2: 20 mmol/L — ABNORMAL LOW (ref 22–32)
Calcium: 7 mg/dL — ABNORMAL LOW (ref 8.9–10.3)
Chloride: 106 mmol/L (ref 98–111)
Creatinine, Ser: 5.73 mg/dL — ABNORMAL HIGH (ref 0.61–1.24)
GFR calc Af Amer: 11 mL/min — ABNORMAL LOW (ref >60)
GFR calc non Af Amer: 10 mL/min — ABNORMAL LOW (ref >60)
Glucose, Bld: 126 mg/dL — ABNORMAL HIGH (ref 70–99)
Phosphorus: 4.5 mg/dL (ref 2.5–4.6)
Potassium: 4.4 mmol/L (ref 3.5–5.1)
Sodium: 143 mmol/L (ref 135–145)

## 2019-10-28 LAB — BASIC METABOLIC PANEL
Anion gap: 17 — ABNORMAL HIGH (ref 5–15)
Anion gap: 17 — ABNORMAL HIGH (ref 5–15)
BUN: 38 mg/dL — ABNORMAL HIGH (ref 8–23)
BUN: 35 mg/dL — ABNORMAL HIGH (ref 8–23)
CO2: 21 mmol/L — ABNORMAL LOW (ref 22–32)
CO2: 19 mmol/L — ABNORMAL LOW (ref 22–32)
Calcium: 6.9 mg/dL — ABNORMAL LOW (ref 8.9–10.3)
Calcium: 6.8 mg/dL — ABNORMAL LOW (ref 8.9–10.3)
Chloride: 106 mmol/L (ref 98–111)
Chloride: 109 mmol/L (ref 98–111)
Creatinine, Ser: 7.21 mg/dL — ABNORMAL HIGH (ref 0.61–1.24)
Creatinine, Ser: 6.49 mg/dL — ABNORMAL HIGH (ref 0.61–1.24)
GFR calc Af Amer: 8 mL/min — ABNORMAL LOW (ref >60)
GFR calc Af Amer: 9 mL/min — ABNORMAL LOW (ref >60)
GFR calc non Af Amer: 7 mL/min — ABNORMAL LOW (ref >60)
GFR calc non Af Amer: 8 mL/min — ABNORMAL LOW (ref >60)
Glucose, Bld: 148 mg/dL — ABNORMAL HIGH (ref 70–99)
Glucose, Bld: 130 mg/dL — ABNORMAL HIGH (ref 70–99)
Potassium: 3 mmol/L — ABNORMAL LOW (ref 3.5–5.1)
Potassium: 3.7 mmol/L (ref 3.5–5.1)
Sodium: 144 mmol/L (ref 135–145)
Sodium: 145 mmol/L (ref 135–145)

## 2019-10-28 LAB — HEPATIC FUNCTION PANEL
ALT: 27 U/L (ref 0–44)
AST: 74 U/L — ABNORMAL HIGH (ref 15–41)
Albumin: 2.9 g/dL — ABNORMAL LOW (ref 3.5–5.0)
Alkaline Phosphatase: 36 U/L — ABNORMAL LOW (ref 38–126)
Bilirubin, Direct: 0.4 mg/dL — ABNORMAL HIGH (ref 0.0–0.2)
Indirect Bilirubin: 0.4 mg/dL (ref 0.3–0.9)
Total Bilirubin: 0.8 mg/dL (ref 0.3–1.2)
Total Protein: 4.8 g/dL — ABNORMAL LOW (ref 6.5–8.1)

## 2019-10-28 LAB — PREPARE FRESH FROZEN PLASMA
Unit division: 0
Unit division: 0

## 2019-10-28 LAB — BPAM RBC
Blood Product Expiration Date: 202104082359
Blood Product Expiration Date: 202104082359
Blood Product Expiration Date: 202104132359
Blood Product Expiration Date: 202104132359
Blood Product Expiration Date: 202104202359
Blood Product Expiration Date: 202104212359
Blood Product Expiration Date: 202104232359
Blood Product Expiration Date: 202104232359
ISSUE DATE / TIME: 202103291533
ISSUE DATE / TIME: 202104010651
ISSUE DATE / TIME: 202104010651
ISSUE DATE / TIME: 202104011147
ISSUE DATE / TIME: 202104011147
ISSUE DATE / TIME: 202104011205
ISSUE DATE / TIME: 202104011205
ISSUE DATE / TIME: 202104012035
Unit Type and Rh: 9500
Unit Type and Rh: 9500
Unit Type and Rh: 9500
Unit Type and Rh: 9500
Unit Type and Rh: 9500
Unit Type and Rh: 9500
Unit Type and Rh: 9500
Unit Type and Rh: 9500

## 2019-10-28 LAB — BPAM PLATELET PHERESIS
Blood Product Expiration Date: 202104012359
Blood Product Expiration Date: 202104022359
ISSUE DATE / TIME: 202104011000
ISSUE DATE / TIME: 202104011547
Unit Type and Rh: 6200
Unit Type and Rh: 6200

## 2019-10-28 LAB — GLUCOSE, CAPILLARY
Glucose-Capillary: 112 mg/dL — ABNORMAL HIGH (ref 70–99)
Glucose-Capillary: 112 mg/dL — ABNORMAL HIGH (ref 70–99)
Glucose-Capillary: 115 mg/dL — ABNORMAL HIGH (ref 70–99)
Glucose-Capillary: 122 mg/dL — ABNORMAL HIGH (ref 70–99)
Glucose-Capillary: 123 mg/dL — ABNORMAL HIGH (ref 70–99)
Glucose-Capillary: 124 mg/dL — ABNORMAL HIGH (ref 70–99)
Glucose-Capillary: 125 mg/dL — ABNORMAL HIGH (ref 70–99)
Glucose-Capillary: 127 mg/dL — ABNORMAL HIGH (ref 70–99)
Glucose-Capillary: 128 mg/dL — ABNORMAL HIGH (ref 70–99)
Glucose-Capillary: 130 mg/dL — ABNORMAL HIGH (ref 70–99)
Glucose-Capillary: 130 mg/dL — ABNORMAL HIGH (ref 70–99)
Glucose-Capillary: 133 mg/dL — ABNORMAL HIGH (ref 70–99)
Glucose-Capillary: 137 mg/dL — ABNORMAL HIGH (ref 70–99)
Glucose-Capillary: 142 mg/dL — ABNORMAL HIGH (ref 70–99)
Glucose-Capillary: 142 mg/dL — ABNORMAL HIGH (ref 70–99)
Glucose-Capillary: 143 mg/dL — ABNORMAL HIGH (ref 70–99)
Glucose-Capillary: 149 mg/dL — ABNORMAL HIGH (ref 70–99)
Glucose-Capillary: 158 mg/dL — ABNORMAL HIGH (ref 70–99)
Glucose-Capillary: 171 mg/dL — ABNORMAL HIGH (ref 70–99)

## 2019-10-28 LAB — PREPARE PLATELET PHERESIS
Unit division: 0
Unit division: 0

## 2019-10-28 LAB — BPAM FFP
Blood Product Expiration Date: 202104052359
Blood Product Expiration Date: 202104052359
ISSUE DATE / TIME: 202104012034
ISSUE DATE / TIME: 202104012034
Unit Type and Rh: 600
Unit Type and Rh: 6200

## 2019-10-28 LAB — BPAM CRYOPRECIPITATE
Blood Product Expiration Date: 202104012314
Blood Product Expiration Date: 202104012314
ISSUE DATE / TIME: 202104011804
ISSUE DATE / TIME: 202104011804
Unit Type and Rh: 5100
Unit Type and Rh: 5100

## 2019-10-28 LAB — APTT: aPTT: 32 seconds (ref 24–36)

## 2019-10-28 LAB — MAGNESIUM
Magnesium: 1.9 mg/dL (ref 1.7–2.4)
Magnesium: 2 mg/dL (ref 1.7–2.4)

## 2019-10-28 LAB — PREPARE CRYOPRECIPITATE
Unit division: 0
Unit division: 0

## 2019-10-28 LAB — PHOSPHORUS: Phosphorus: 4.6 mg/dL (ref 2.5–4.6)

## 2019-10-28 LAB — PATHOLOGIST SMEAR REVIEW

## 2019-10-28 MED ORDER — FENTANYL 2500MCG IN NS 250ML (10MCG/ML) PREMIX INFUSION
25.0000 ug/h | INTRAVENOUS | Status: DC
  Administered 2019-10-28: 25 ug/h via INTRAVENOUS
  Filled 2019-10-28 (×2): qty 250

## 2019-10-28 MED ORDER — PANTOPRAZOLE SODIUM 40 MG PO PACK
40.0000 mg | PACK | Freq: Every day | ORAL | Status: DC
  Administered 2019-10-28 – 2019-10-29 (×2): 40 mg
  Filled 2019-10-28 (×2): qty 20

## 2019-10-28 MED ORDER — POTASSIUM CHLORIDE 20 MEQ/15ML (10%) PO SOLN
20.0000 meq | ORAL | Status: DC
  Administered 2019-10-28 (×2): 20 meq
  Filled 2019-10-28 (×2): qty 15

## 2019-10-28 MED ORDER — SODIUM BICARBONATE 8.4 % IV SOLN
50.0000 meq | Freq: Once | INTRAVENOUS | Status: AC
  Administered 2019-10-28: 19:00:00 50 meq via INTRAVENOUS
  Filled 2019-10-28: qty 50

## 2019-10-28 MED ORDER — HEPARIN SODIUM (PORCINE) 1000 UNIT/ML DIALYSIS
1000.0000 [IU] | INTRAMUSCULAR | Status: DC | PRN
  Administered 2019-10-31 – 2019-11-01 (×2): 4200 [IU] via INTRAVENOUS_CENTRAL
  Filled 2019-10-28: qty 6
  Filled 2019-10-28: qty 5
  Filled 2019-10-28 (×2): qty 6
  Filled 2019-10-28: qty 4

## 2019-10-28 MED ORDER — SODIUM BICARBONATE 8.4 % IV SOLN
INTRAVENOUS | Status: AC
  Administered 2019-10-28: 50 meq
  Filled 2019-10-28: qty 50

## 2019-10-28 MED ORDER — PRISMASOL BGK 4/2.5 32-4-2.5 MEQ/L REPLACEMENT SOLN: Status: DC

## 2019-10-28 MED ORDER — FENTANYL BOLUS VIA INFUSION
25.0000 ug | INTRAVENOUS | Status: DC | PRN
  Filled 2019-10-28: qty 25

## 2019-10-28 MED ORDER — DOCUSATE SODIUM 50 MG/5ML PO LIQD
200.0000 mg | Freq: Every day | ORAL | Status: DC
  Administered 2019-10-28 – 2019-10-29 (×2): 200 mg
  Filled 2019-10-28 (×2): qty 20

## 2019-10-28 MED ORDER — SODIUM CHLORIDE 0.9 % IV SOLN
1.5000 g | Freq: Two times a day (BID) | INTRAVENOUS | Status: AC
  Administered 2019-10-28 – 2019-10-29 (×3): 1.5 g via INTRAVENOUS
  Filled 2019-10-28 (×3): qty 1.5

## 2019-10-28 MED ORDER — ALBUMIN HUMAN 5 % IV SOLN
12.5000 g | Freq: Once | INTRAVENOUS | Status: AC
  Administered 2019-10-28: 12.5 g via INTRAVENOUS
  Filled 2019-10-28: qty 250

## 2019-10-28 MED ORDER — ROSUVASTATIN CALCIUM 5 MG PO TABS
10.0000 mg | ORAL_TABLET | Freq: Every day | ORAL | Status: DC
  Administered 2019-10-28 – 2019-10-29 (×2): 10 mg
  Filled 2019-10-28 (×2): qty 2

## 2019-10-28 MED ORDER — LEVALBUTEROL HCL 0.63 MG/3ML IN NEBU
0.6300 mg | INHALATION_SOLUTION | Freq: Four times a day (QID) | RESPIRATORY_TRACT | Status: DC
  Administered 2019-10-28 – 2019-10-30 (×10): 0.63 mg via RESPIRATORY_TRACT
  Filled 2019-10-28 (×10): qty 3

## 2019-10-28 MED ORDER — PRISMASOL BGK 4/2.5 32-4-2.5 MEQ/L IV SOLN: INTRAVENOUS | Status: DC

## 2019-10-28 MED ORDER — RENA-VITE PO TABS
1.0000 | ORAL_TABLET | Freq: Every day | ORAL | Status: DC
  Administered 2019-10-28 – 2019-10-29 (×2): 1
  Filled 2019-10-28 (×2): qty 1

## 2019-10-28 MED ORDER — VITAL AF 1.2 CAL PO LIQD
1000.0000 mL | ORAL | Status: DC
  Administered 2019-10-28 – 2019-10-29 (×3): 1000 mL

## 2019-10-28 MED ORDER — PRO-STAT SUGAR FREE PO LIQD
30.0000 mL | Freq: Three times a day (TID) | ORAL | Status: DC
  Administered 2019-10-28 – 2019-10-29 (×5): 30 mL
  Filled 2019-10-28 (×5): qty 30

## 2019-10-28 MED ORDER — FENTANYL CITRATE (PF) 100 MCG/2ML IJ SOLN
25.0000 ug | Freq: Once | INTRAMUSCULAR | Status: AC
  Administered 2019-10-28: 25 ug via INTRAVENOUS

## 2019-10-28 MED ORDER — SODIUM CHLORIDE 0.9% FLUSH: 3.0000 mL | Freq: Two times a day (BID) | INTRAVENOUS | Status: DC

## 2019-10-28 MED ORDER — SODIUM CHLORIDE 0.9% FLUSH: 3.0000 mL | INTRAVENOUS | Status: DC | PRN

## 2019-10-28 MED ORDER — ARGATROBAN 50 MG/50ML IV SOLN
0.3000 ug/kg/min | INTRAVENOUS | Status: DC
  Administered 2019-10-28: 0.5 ug/kg/min via INTRAVENOUS
  Administered 2019-10-29: 0.4 ug/kg/min via INTRAVENOUS
  Administered 2019-10-30: 0.3 ug/kg/min via INTRAVENOUS
  Filled 2019-10-28 (×3): qty 50

## 2019-10-28 MED ORDER — CALCIUM GLUCONATE-NACL 2-0.675 GM/100ML-% IV SOLN
2.0000 g | Freq: Once | INTRAVENOUS | Status: AC
  Administered 2019-10-28: 07:00:00 2000 mg via INTRAVENOUS
  Filled 2019-10-28: qty 100

## 2019-10-28 MED ORDER — VITAL AF 1.2 CAL PO LIQD: 1000.0000 mL | ORAL | Status: DC

## 2019-10-28 MED ORDER — SODIUM CHLORIDE 0.9 % IV SOLN: 250.0000 mL | INTRAVENOUS | Status: DC | PRN

## 2019-10-28 MED ORDER — POTASSIUM CHLORIDE 20 MEQ/15ML (10%) PO SOLN
20.0000 meq | Freq: Once | ORAL | Status: AC
  Administered 2019-10-28: 20 meq
  Filled 2019-10-28: qty 15

## 2019-10-28 NOTE — Discharge Summary (Signed)
Physician Discharge Summary       [address]       [place] [address]             [phone]    Patient ID: Patrick [name] MRN: [id number] DOB/AGE: 30-Sep-1955 64 y.o.  Admit date: 10/24/2019 Discharge date: 12/13/2019  Admission Diagnoses: 1. S/p NSTEMI (non-ST elevated myocardial infarction) ([place]) 2. History of coronary artery disease and MI  Discharge Diagnoses:  1. S/P CABG x 3 2. 3. History of ESRD (end stage renal disease) ([place]) 4. History of essential hypertension 5. History of Type 2 diabetes mellitus ([place]) 6. History of cardiomyopathy  7. History of atrial flutter (HCC)-s/p ablation 2018 8. History of gout 9. History of kidney stones 10. History of pneumonia 11. History of Secondary hyperparathyroidism ([place]) 12. History of hernia of abdominal wall 13. History of arthritis 14. History of erectile dysfunction  Consults: pulmonary/intensive care and nephrology   Procedure (s):   Aortic valve replacement (23 mm Edwards Intuity bovine bioprosthetic) Coronary Artery Bypass Grafting x 3             Reverse saphenous vein graft to Distal Left Anterior Descending Coronary Artery; reverse saphenous Vein Graft to Posterior Descending Coronary Artery and distal obtuse Marginal Branch of Left Circumflex Coronary Artery; Endoscopic Vein Harvest from right thigh and Lower Leg  Procedure(s) and Anesthesia Type:    * STERNAL WOUND DEBRIDEMENT - General    * APPLICATION OF WOUND VAC - General   History of Presenting Illness: This is a 64 year old man with 20+ year h/o CAD s/p CABG in 1997 (LIMA to LAD) and renal failure presented with increased chest pressure, tightness at rest which as worsened in severity and frequency over the past several days to weeks. He has been dialyzing successfully. He denies sx of heart failure. He was admitted after ruling in for NSTEMI. LHC shows severe multivessel CAD. Referral for CABG. Additional pre-operative work-up shows  moderate to severe aortic valve insufficiency.  Dr. Orvan Seen discussed the need for coronary artery bypass grafting surgery and aortic valve replacement. Potential risks, benefits, and complications of the surgery were discussed with the patient and he agreed to proceed with surgery. Pre operative carotid duplex US showed no significant internal carotid artery stenosis bilaterally. He underwent a CABG x 3 and AVR on 10/27/2019.  Brief Hospital Course:  Patient remained intubated until 04/04.   The patient lost pulses in his left foot.  Due to this his IABP was removed on 04/04.  His foot re-perfused and pulses were found. He was gradually weaned off Milrinone, Levophed, Neo Synephrine, and Vasopressin drips. CVVH was done on post op day one and he was followed closely by Nephrology during his stay.  He developed a significant drop in platelet count, lowest being 29 K.  He was started on Argatroban for possible HIT.  Test was obtained and was negative.  He underwent SLP evaluation and was found to be a mild risk for aspiration.  He underwent placement of tunneled central line due to poor venous access.  He required placement of a nasojejunal feeding tube due to poor oral intake.  He developed bile emesis.  CT scan of the abdomen showed the patient had an ileus.  His tube feedings were stopped and his NG tube was placed on continuous suction.  He moved his bowels and his ileus resolved.  He developed Atrial Fibrillation and was treated with Amiodarone.  He continued to have short  bursts of Atrial Fibrillation. He was placed on Apixiban for stroke prophylaxis.  He required Neo-synephrine at times for BP support.  He developed a fever.  All IV lines were removed blood and sputum cultures were sent.  These were all negative for bacterial growth.  He was started on antibiotics for new onset leukocytosis and presumed PNA.  He has remained on CVVH to assist with volume removal.  His hemoglobin decreased and he required  transfusion of packed cells.  He has been weaned off BIPAP use during the day, but he does continue to use nightly.  He was noted on exam to have slightly distant heart sounds.  Echocardiogram was obtained and showed no evidence of cardiac tamponade.  His EF had improved to 45-50%.  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  Cardiac gated CT was obtained but was unable to be completely performed.  The patient suffered a PEA arrest while in the scanner.  CPR was performed and ROSC was achieved.  He was re-intubated during resuscitation.  He again require CRRT.  CT scan of the chest did not showed pericardial pathology.  There was evidence pleural effusions.  He was again weaned and extubated on 11/22/2019.  He was weaned off CVVH and started on Hemodialysis on 5/1.  He unfortunately had issues with hypotension and required resumption of BP support.  These drips were again weaned as tolerated.  He developed a superficial sternal infection.  This required OR debridement on 11/29/2019.  Wound cultures obtained at that time are growing Enterococcus faecium.  He is on antibiotics for this.   Wound care consult was placed for wound vac changes.  His vac was changed on 12/02/2019.  The wound appeared pink and dry.  Wound vac was replaced and will require change every MWF.  He is off all drips.  He is hemodynamically stable.  He is on hemodialysis and tolerating.  He is medically stable for discharge to [facility] today.  Latest Vital Signs: Blood pressure (!) 89/65, pulse (!) 115, temperature 98.8 F (37.1 C), temperature source Axillary, resp. rate (!) 26, height 5' 6.5" (1.689 m), weight 93.4 kg, SpO2 100 %.  Physical Exam:  General appearance: slowed mentation Neurologic: intact Heart: regular rate and rhythm, S1, S2 normal, no murmur, click, rub or gallop Wound: dressed with wound vac  Discharge Condition: Stable and discharged to LTAC  Recent laboratory studies:  Lab  Results  Component Value Date   WBC 6.9 12/12/2019   HGB 7.9 (L) 12/12/2019   HCT 25.0 (L) 12/12/2019   MCV 88.7 12/12/2019   PLT 79 (L) 12/12/2019   Lab Results  Component Value Date   NA 135 12/12/2019   K 3.9 12/12/2019   CL 98 12/12/2019   CO2 23 12/12/2019   CREATININE 8.08 (H) 12/12/2019   GLUCOSE 106 (H) 12/12/2019    Diagnostic Studies:   ECHOCARDIOGRAM COMPLETE  Result Date: 11/16/2019    ECHOCARDIOGRAM REPORT   Patient Name:   KASTON FAUGHN Date of Exam: 11/16/2019 Medical Rec #:  [id number]       Height:       66.5 in Accession #:    [id number]      Weight:       213.0 lb Date of Birth:  June 07, 1956        BSA:          2.065 m Patient Age:    66 years        BP:  118/81 mmHg Patient Gender: M               HR:           92 bpm. Exam Location:  Inpatient Procedure: 2D Echo STAT ECHO Indications:    CHF 428  History:        Patient has prior history of Echocardiogram examinations, most                 recent 11/09/2019. Cardiomyopathy, Previous Myocardial Infarction                 and CAD, Prior CABG, Arrythmias:Atrial Flutter; Risk                 Factors:Diabetes, Hypertension and Former Smoker. Aortic valve                 replacement.                 Aortic Valve: 23 mm bioprosthetic valve is present in the aortic                 position. Procedure Date: 10/27/2019.  Sonographer:    Jannett Celestine RDCS (AE) Referring Phys: [place]  Sonographer Comments: Technically difficult study due to poor echo windows. limited mobility IMPRESSIONS  1. There is concern for a small to moderate pericardial effusion but the study is very poor quality. Possibly it is more pronounsed posteriorly to the LV. There is enhanced ventricular interdependce which could be see post pericardiotomy, constriction, or in tamponade. The study was too poor quality for assessment of MV/TV inflow. He seems to have had similar septal bounce on prior studies. EF continues to improve, 45-50%. Overall,  clinical correlation is recommended. CT scan vs TEE may be helpful to futher delineate the pericardial space.  2. Left ventricular ejection fraction, by estimation, is 45 to 50%. The left ventricle has mildly decreased function. The left ventricle demonstrates global hypokinesis. There is mild concentric left ventricular hypertrophy. Left ventricular diastolic function could not be evaluated.  3. Right ventricular systolic function is mildly reduced. The right ventricular size is mildly enlarged. Tricuspid regurgitation signal is inadequate for assessing PA pressure.  4. The mitral valve is degenerative. No evidence of mitral valve regurgitation. No evidence of mitral stenosis.  5. The aortic valve has been repaired/replaced. Aortic valve regurgitation is not visualized. There is a 23 mm bioprosthetic valve present in the aortic position. Procedure Date: 10/27/2019. Echo findings are consistent with normal structure and function of the aortic valve prosthesis. Aortic valve area, by VTI measures 2.61 cm. Aortic valve mean gradient measures 4.0 mmHg. Aortic valve Vmax measures 1.58 m/s. Comparison(s): Changes from prior study are noted. FINDINGS  Left Ventricle: Left ventricular ejection fraction, by estimation, is 45 to 50%. The left ventricle has mildly decreased function. The left ventricle demonstrates global hypokinesis. The left ventricular internal cavity size was normal in size. There is  mild concentric left ventricular hypertrophy. Left ventricular diastolic function could not be evaluated. Right Ventricle: The right ventricular size is mildly enlarged. Right vetricular wall thickness was not assessed. Right ventricular systolic function is mildly reduced. Tricuspid regurgitation signal is inadequate for assessing PA pressure. Left Atrium: Left atrial size was normal in size. Right Atrium: Right atrial size was normal in size. Pericardium: A small pericardial effusion is present. The pericardial effusion is  circumferential. Mitral Valve: The mitral valve is degenerative in appearance. Mild to moderate mitral annular calcification. No  evidence of mitral valve regurgitation. No evidence of mitral valve stenosis. Tricuspid Valve: The tricuspid valve is grossly normal. Tricuspid valve regurgitation is not demonstrated. No evidence of tricuspid stenosis. Aortic Valve: The aortic valve has been repaired/replaced. Aortic valve regurgitation is not visualized. Aortic valve mean gradient measures 4.0 mmHg. Aortic valve peak gradient measures 10.0 mmHg. Aortic valve area, by VTI measures 2.61 cm. There is a 23 mm bioprosthetic valve present in the aortic position. Procedure Date: 10/27/2019. Echo findings are consistent with normal structure and function of the aortic valve prosthesis. Pulmonic Valve: The pulmonic valve was grossly normal. Pulmonic valve regurgitation is not visualized. No evidence of pulmonic stenosis. Aorta: The aortic root is normal in size and structure. Venous: The inferior vena cava was not well visualized. IAS/Shunts: The atrial septum is grossly normal.  LEFT VENTRICLE PLAX 2D LVIDd:         3.94 cm  Diastology LVIDs:         2.54 cm  LV e' lateral:   6.74 cm/s LV PW:         1.03 cm  LV E/e' lateral: 17.4 LV IVS:        1.33 cm LVOT diam:     2.30 cm LV SV:         60 LV SV Index:   29 LVOT Area:     4.15 cm  LEFT ATRIUM         Index LA diam:    2.80 cm 1.36 cm/m  AORTIC VALVE AV Area (Vmax):    2.40 cm AV Area (Vmean):   2.23 cm AV Area (VTI):     2.61 cm AV Vmax:           158.00 cm/s AV Vmean:          97.300 cm/s AV VTI:            0.231 m AV Peak Grad:      10.0 mmHg AV Mean Grad:      4.0 mmHg LVOT Vmax:         91.40 cm/s LVOT Vmean:        52.200 cm/s LVOT VTI:          0.145 m LVOT/AV VTI ratio: 0.63  AORTA Ao Root diam: 2.80 cm MITRAL VALVE MV Area (PHT): 2.39 cm     SHUNTS MV Decel Time: 317 msec     Systemic VTI:  0.14 m MV E velocity: 117.00 cm/s  Systemic Diam: 2.30 cm MV A velocity:  145.00 cm/s MV E/A ratio:  0.81 Eleonore Chiquito MD Electronically signed by Eleonore Chiquito MD Signature Date/Time: 11/16/2019/10:54:26 AM    Final    Discharge Medications: Allergies as of 12/13/2019      Reactions   Baclofen Other (See Comments)   Possibly stroke like symptoms   Iodinated Diagnostic Agents Swelling, Rash, Other (See Comments)   Other Reaction: burning to mouth, swelling of lips   Lipitor [atorvastatin] Other (See Comments)   Leg pain   Metoprolol Other (See Comments)   Headaches       Medication List    STOP taking these medications   acetaminophen 500 MG tablet Commonly known as: TYLENOL Replaced by: acetaminophen 160 MG/5ML solution   albuterol 108 (90 Base) MCG/ACT inhaler Commonly known as: VENTOLIN HFA   aspirin EC 81 MG tablet Replaced by: aspirin 81 MG chewable tablet   calcium acetate 667 MG capsule Commonly known as: PHOSLO   calcium carbonate 1250 (  500 Ca) MG tablet Commonly known as: OS-CAL - dosed in mg of elemental calcium   clopidogrel 75 MG tablet Commonly known as: PLAVIX   Colchicine 0.6 MG Caps   Delsym 30 MG/5ML liquid Generic drug: dextromethorphan   Dextromethorphan-guaiFENesin 5-100 MG/5ML Liqd   docusate sodium 100 MG capsule Commonly known as: COLACE   doxycycline 100 MG tablet Commonly known as: VIBRA-TABS   ezetimibe 10 MG tablet Commonly known as: ZETIA   isosorbide mononitrate 30 MG 24 hr tablet Commonly known as: IMDUR   lidocaine-prilocaine cream Commonly known as: EMLA   metoprolol succinate 25 MG 24 hr tablet Commonly known as: Toprol XL   MIRCERA IJ   neomycin-bacitracin-polymyxin ointment Commonly known as: NEOSPORIN   nitroGLYCERIN 0.4 MG SL tablet Commonly known as: NITROSTAT   oxyCODONE-acetaminophen 5-325 MG tablet Commonly known as: PERCOCET/ROXICET   repaglinide 0.5 MG tablet Commonly known as: PRANDIN   silver sulfADIAZINE 1 % cream Commonly known as: Silvadene   traMADol 50 MG  tablet Commonly known as: ULTRAM     TAKE these medications   acetaminophen 160 MG/5ML solution Commonly known as: TYLENOL Take 20.3 mLs (650 mg total) by mouth every 6 (six) hours as needed for mild pain or fever. Replaces: acetaminophen 500 MG tablet   amiodarone 200 MG tablet Commonly known as: PACERONE Place 1 tablet (200 mg total) into feeding tube daily.   apixaban 5 MG Tabs tablet Commonly known as: ELIQUIS Place 1 tablet (5 mg total) into feeding tube 2 (two) times daily.   ascorbic acid 500 MG tablet Commonly known as: VITAMIN C Place 1 tablet (500 mg total) into feeding tube 2 (two) times daily.   aspirin 81 MG chewable tablet Place 1 tablet (81 mg total) into feeding tube daily. Replaces: aspirin EC 81 MG tablet   Darbepoetin Alfa 200 MCG/0.4ML Sosy injection Commonly known as: ARANESP Inject 0.4 mLs (200 mcg total) into the vein every Friday with hemodialysis. Start taking on: Dec 16, 2019   feeding supplement (ENSURE ENLIVE) Liqd Place 237 mLs into feeding tube 3 (three) times daily between meals.   feeding supplement (PRO-STAT SUGAR FREE 64) Liqd Place 30 mLs into feeding tube 4 (four) times daily.   levalbuterol 0.63 MG/3ML nebulizer solution Commonly known as: XOPENEX Take 3 mLs (0.63 mg total) by nebulization 4 (four) times daily as needed for wheezing or shortness of breath.   midodrine 10 MG tablet Commonly known as: PROAMATINE Place 2 tablets (20 mg total) into feeding tube 3 (three) times daily with meals.   multivitamin Tabs tablet Take 1 tablet by mouth at bedtime. What changed: when to take this   oxyCODONE 5 MG immediate release tablet Commonly known as: Oxy IR/ROXICODONE Place 1-2 tablets (5-10 mg total) into feeding tube every 3 (three) hours as needed for severe pain.   pantoprazole 40 MG tablet Commonly known as: PROTONIX Take 1 tablet (40 mg total) by mouth daily.   rosuvastatin 10 MG tablet Commonly known as: CRESTOR Place 1  tablet (10 mg total) into feeding tube daily at 6 PM. What changed:   how to take this  when to take this   sodium chloride 0.65 % Soln nasal spray Commonly known as: OCEAN Place 1 spray into both nostrils as needed for congestion.      The patient has been discharged on:   1.Beta Blocker:  Yes [   ]  No   [ x  ]                              If No, reason: hypotension  2.Ace Inhibitor/ARB: Yes [   ]                                     No  [ x   ]                                     If No, reason: labile BP 3.Statin:   Yes [x   ]                  No  [   ]                  If No, reason:  4.Ecasa:  Yes  Valu.[place]ieves   ]                  No   [   ]                  If No, reason:  Follow Up Appointments: Follow-up Information    Wonda Olds, MD. Schedule an appointment as soon as possible for a visit.   Specialty: Cardiothoracic Surgery Why: Please contact once discharged from [facility] to make surgical follow up  Contact information: [address] [place] [address] [phone]           Signed: Ellamae Sia 12/13/2019, 9:04 AM

## 2019-10-28 NOTE — Plan of Care (Signed)

## 2019-10-28 NOTE — Progress Notes (Signed)
      [address]       [place] [address]             [phone]      POD # 1 CABG AVR  Intubated, sedated  BP (!) 132/37   Pulse 92   Temp (!) 97.5 F (36.4 C)   Resp (!) 23   Ht 5' 6.5" (1.689 m)   Wt 117.7 kg   SpO2 95%   BMI 41.25 kg/m   Epi @ 5, milrinone 0.125, norepi 16 IABP 1:1  7.31/45/77 K= 4.6 Hct= 34  Critically ill Continue current treatments  Remo Lipps C. Roxan Hockey, MD [facility] [phone]

## 2019-10-28 NOTE — Progress Notes (Signed)
ANTICOAGULATION CONSULT NOTE - Initial Consult  Pharmacy Consult for argatroban Indication: R/o HIT  Allergies  Allergen Reactions  . Baclofen Other (See Comments)    Possibly stroke like symptoms  . Iodinated Diagnostic Agents Swelling, Rash and Other (See Comments)    Other Reaction: burning to mouth, swelling of lips  . Lipitor [Atorvastatin] Other (See Comments)    Leg pain  . Metoprolol Other (See Comments)    Headaches     Patient Measurements: Height: 5' 6.5" (168.9 cm) Weight: 117.7 kg (259 lb 7.7 oz) IBW/kg (Calculated) : 64.95  Vital Signs: Temp: 97.3 F (36.3 C) (04/02 2215) Temp Source: Oral (04/02 2137) BP: 115/29 (04/02 2043) Pulse Rate: 96 (04/02 2215)  Labs: Recent Labs     0000 10/26/19 0802 10/26/19 0802 10/27/19 0451 10/27/19 4696 10/27/19 1731 10/27/19 1827 10/27/19 2011 10/28/19 0315 10/28/19 0315 10/28/19 2952 10/28/19 0613 10/28/19 1015 10/28/19 1611 10/28/19 1825  HGB  --  10.8*   < > 12.5*   < > 10.4*  --    < > 12.8*   < > 10.9*   < >  --  12.2* 11.6*  HCT  --  33.5*   < > 38.9*   < > 31.6*  --    < > 37.5*   < > 32.0*  --   --  35.2* 34.0*  PLT  --  164   < > 160   < > 93*  --   --  48*  --   --   --   --  29*  --   APTT  --   --   --  >200*  --   --  >200*  --  32  --   --   --   --   --   --   LABPROT  --   --   --  17.2*  --   --  25.0*  --  17.0*  --   --   --   --   --   --   INR  --   --   --  1.4*  --   --  2.3*  --  1.4*  --   --   --   --   --   --   HEPARINUNFRC  --  0.34  --  >2.20*  --   --   --   --   --   --   --   --   --   --   --   CREATININE   < > 12.80*   < > 8.72*   < >  --   --   --  7.21*  --   --   --  6.49* 5.73*  --    < > = values in this interval not displayed.    Estimated Creatinine Clearance: 15.7 mL/min (A) (by C-G formula based on SCr of 5.73 mg/dL (H)).   Medical History: Past Medical History:  Diagnosis Date  . Arthritis   . Atrial flutter ([place])    Ablation 2018  . 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 Erectile dysfunction   . ESRD (end stage renal disease) on dialysis ([place])    Pt on HD 2003 >> transplanted in 2009, back on HD in  2016. [facility].   . Essential hypertension   . Gout   . Hernia of abdominal wall   . History of blood transfusion   . History of cardiomyopathy   . History of kidney stones   . History of pneumonia   . Migraine   . Myocardial infarction ([place])    1996  . Pneumonia   . Secondary hyperparathyroidism ([place])   . Type 2 diabetes mellitus ([place])   . Wears glasses     Medications:  Medications Prior to Admission  Medication Sig Dispense Refill Last Dose  . acetaminophen (TYLENOL) 500 MG tablet Take 1,000 mg by mouth every 6 (six) hours as needed for moderate pain or headache.   Past Week at Unknown time  . albuterol (PROVENTIL HFA;VENTOLIN HFA) 108 (90 Base) MCG/ACT inhaler Inhale 1-2 puffs into the lungs every 6 (six) hours as needed for wheezing or shortness of breath. 1 Inhaler 0 unk  . aspirin EC 81 MG tablet Take 81 mg by mouth daily.    10/24/2019 at Unknown time  . calcium acetate (PHOSLO) 667 MG capsule Take 1,334-2,001 mg by mouth See admin instructions. 3 capsules with dinner and 2 capsules with snacks   Past Week at Unknown time  . calcium carbonate (OS-CAL - DOSED IN MG OF ELEMENTAL CALCIUM) 1250 (500 Ca) MG tablet Take 2 tablets by mouth daily.    Past Month at Unknown time  . clopidogrel (PLAVIX) 75 MG tablet TAKE 1 TABLET(75 MG) BY MOUTH DAILY (Patient taking differently: Take 75 mg by mouth daily. ) 90 tablet 3 Past Week at 2000  . Colchicine 0.6 MG CAPS Take 0.6 mg by mouth See admin instructions. Take 0.6 mg when first symptoms of gout flare occur, then take another 0.6 mg 14 days later as needed for gout   unk  . dextromethorphan (DELSYM) 30 MG/5ML liquid Take  30 mg by mouth as needed for cough.   unk  . Dextromethorphan-guaiFENesin 5-100 MG/5ML LIQD Take 15 mLs by mouth 2 (two) times daily as needed (Cough).    unk  . docusate sodium (COLACE) 100 MG capsule Take 100 mg by mouth daily as needed for mild constipation.   unk  . ezetimibe (ZETIA) 10 MG tablet TAKE 1 TABLET(10 MG) BY MOUTH DAILY (Patient taking differently: Take 10 mg by mouth daily. ) 90 tablet 3 Past Week at Unknown time  . isosorbide mononitrate (IMDUR) 30 MG 24 hr tablet Take 1 tablet (30 mg total) by mouth daily. 90 tablet 3 Past Week at Unknown time  . lidocaine-prilocaine (EMLA) cream Apply 1 application topically daily as needed (Numbing).    Past Month at Unknown time  . metoprolol succinate (TOPROL XL) 25 MG 24 hr tablet Take 1 tablet (25 mg total) by mouth daily. 90 tablet 3 Past Week at 2100  . multivitamin (RENA-VIT) TABS tablet Take 1 tablet by mouth daily.    Past Week at Unknown time  . neomycin-bacitracin-polymyxin (NEOSPORIN) ointment Apply 1 application topically 2 (two) times daily. Apply to feet.     . nitroGLYCERIN (NITROSTAT) 0.4 MG SL tablet Place 1 tablet (0.4 mg total) under the tongue every 5 (five) minutes as needed for chest pain. 25 tablet 6 10/24/2019 at Unknown time  . repaglinide (PRANDIN) 0.5 MG tablet Take 1 tablet (0.5 mg total) by mouth 2 (two) times daily before a meal. 60 tablet 4 Past Week at Unknown time  . rosuvastatin (CRESTOR) 10 MG tablet Take 1 tablet (10 mg  total) by mouth daily. 90 tablet 3 Past Week at Unknown time  . silver sulfADIAZINE (SILVADENE) 1 % cream Apply 1 application topically daily. (Patient taking differently: Apply 1 application topically 2 (two) times daily. ) 50 g 0 Past Week at Unknown time  . traMADol (ULTRAM) 50 MG tablet Take 50 mg by mouth 2 (two) times daily as needed for moderate pain.    Past Week at Unknown time  . doxycycline (VIBRA-TABS) 100 MG tablet Take 1 tablet (100 mg total) by mouth 2 (two) times daily. (Patient  not taking: Reported on 10/24/2019) 20 tablet 0 Completed Course at Unknown time  . Methoxy PEG-Epoetin Beta (MIRCERA IJ) Mircera     . oxyCODONE-acetaminophen (PERCOCET/ROXICET) 5-325 MG tablet Take 1 tablet by mouth every 6 (six) hours as needed. (Patient not taking: Reported on 10/24/2019) 12 tablet 0 Not Taking at Unknown time  . pantoprazole (PROTONIX) 40 MG tablet Take 1 tablet (40 mg total) by mouth daily. 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 H/H stable s/p transfusion. Plt down to 29k.   Goal of Therapy:  aPTT 50-65 seconds Monitor platelets by anticoagulation protocol: Yes   Plan:  -Start argatroban 0.5 mcg/kg/min -F/u 2hr aPTT  -Monitor for bleeding  -F/u HIT panel results   Albertina Parr, PharmD., BCPS Clinical Pharmacist Clinical phone for 10/28/19 until 11pm: [phone] If after 11pm, please refer to [facility] for unit-specific pharmacist

## 2019-10-28 NOTE — Progress Notes (Signed)
Dr. Orvan Seen notified of ABG results. One amp of bicarb given.    Ref. Range 10/28/2019 18:25  Sample type Unknown ARTERIAL  pH, Arterial Latest Ref Range: 7.350 - 7.450  7.306 (L)  pCO2 arterial Latest Ref Range: 32.0 - 48.0 mmHg 45.1  pO2, Arterial Latest Ref Range: 83.0 - 108.0 mmHg 77.0 (L)  TCO2 Latest Ref Range: 22 - 32 mmol/L 24  Acid-base deficit Latest Ref Range: 0.0 - 2.0 mmol/L 4.0 (H)  Bicarbonate Latest Ref Range: 20.0 - 28.0 mmol/L 22.6  O2 Saturation Latest Units: % 94.0  Patient temperature Unknown 36.5 C

## 2019-10-28 NOTE — Progress Notes (Signed)
NAME:  Patrick [name], MRN:  [id number], DOB:  [birth year], LOS: 4 ADMISSION DATE:  10/24/2019, CONSULTATION DATE:  10/28/19 REFERRING MD:  Orvan Seen, CHIEF COMPLAINT:  NSTEMI s/p CABG  Brief History   Patrick [name] is a 84 yom with PMH of ESRD on HD s/p renal transplant, CAD s/p CABG (1997) DES (2020) CABG (10/27/19), HFpEF, T2DM, PAF who presented to [facility] on 10/24/19 for progressive chest pain and SOB and was found to have an NSTEMI and multivessel CAD on heart cath. Pre-op workup additionally revealed mod-severe AV insufficiency. CT surgery was consulted and he underwent CABG on 4/1 with AVR and IABP. PCCM asked to consult for vent management    Past Medical History  ESRD on HD s/p renal transplant,  CAD s/p CABG (1997) DES (2020) CABG (10/27/19) HFpEF PAF T2DM  Significant Hospital Events   3/29>admission 3/30>heart cath>multivessel disease 4/1>CABG with IABP placement, AVR. 4U PRBC, 2U FFP transfused  Consults:  PCCM  Significant Diagnostic Tests:  3/30 CT chest>>minimal LL atelectasis 3/30 LHC>>ost 1st diagonal lesion 50% stenosed, prox LAD 100% stenosed, Mid RCA 100% stenosed, ost cx to prox cx lesion 90% stenosed, mild elevation in LVEDP 3/31 echo>>LVEF 45-50%; LV global hypokinesis; normal RV systolic pressure and size, normal PA pressure, moderate LA dilation, moderate AV regurg 4/1 CXR>>stable cardiomegaly; mild pulm edema; trace b/l pleural effusions with bibasilar atelectasis 4/2 CXR>>diffuse mild b/l pulm infiltrates/edema Micro Data:  MRSA neg  Antimicrobials:  Cefuroxime 4/1>  Interim history/subjective:  Received 4U PRBC, 2U FFP. Bedside RN starting CRRT this am  Objective   Blood pressure (!) 146/54, pulse 93, temperature (!) 96.3 F (35.7 C), temperature source Axillary, resp. rate (!) 0, height 5' 6.5" (1.689 m), weight 117.7 kg, SpO2 97 %. CVP:  [5 mmHg] 5 mmHg  Vent Mode: PRVC FiO2 (%):  [40 %-70 %] 40 % Set Rate:  [12 bmp-18 bmp] 18 bmp Vt Set:  [510 mL]  510 mL PEEP:  [5 cmH20-8 cmH20] 8 cmH20 Pressure Support:  [10 cmH20] 10 cmH20 Plateau Pressure:  [30 cmH20-32 cmH20] 32 cmH20   Intake/Output Summary (Last 24 hours) at 10/28/2019 0759 Last data filed at 10/28/2019 0700 Gross per 24 hour  Intake 10695.47 ml  Output 2603 ml  Net 8092.47 ml   Filed Weights   10/26/19 1500 10/27/19 0300 10/28/19 0500  Weight: 91.6 kg 96.7 kg 117.7 kg    Examination: General: intubated, sedated HENT: moderate facial edema; ETT Lungs: lung sounds distant Cardiovascular: RRR. Extremities cool to touch. Fairly significant edema in the face. Minimal LE edema  CT/baloon pump in place Abdomen: bs active Extremities: cool Neuro: sedated GU: foley  Resolved Hospital Problem list   none  Assessment & Plan:   Multivessel CAD s/p repeat CABG 4/1 AV insufficiency s/p AVR with IABP Combined heart failure. Fairly hypotensive post operatively. Cardiogenic shock. Continue milrinone, levo, neo, vasopressin per CTS  Post-op respiratory insufficiency. CT surgery requesting continued ventilatory support today.  CT placement 4/1. Continue to monitor Pulm hygeine. MAPs precluding volume removal with CRRT however volume removal will likely benefit ability to extubate when ready. Will work on weaning vent once IABP removed  Acute encephalopathy 2/2 post-op crit illness. RASS goal -3 and -4. Will transition off of precedex to fentanyl in hopes of improving hemodynamic instability  Post op blood loss anemia, thrombocytopenia. Transfused 4U PRBC and 2U FFP overnight. Post transfusion hgb 10.9.  Transfuse for hgb <8 and platelet <10.  ESRD on HD. Appears significantly hypervolemic  this morning. CRRT today. Management per nephrology. K goal 4  T2DM. On insulin drip.    Best practice:  Diet: NPO Pain/Anxiety/Delirium protocol (if indicated): morphine, versed VAP protocol (if indicated): yes DVT prophylaxis:  GI prophylaxis: colace, pepcid, zofran,  protonix Glucose control: insulin drip Mobility: BR Code Status: full Family Communication: none Disposition: ICU  Labs   CBC: Recent Labs  Lab 10/24/19 0737 10/24/19 0737 10/25/19 0459 10/25/19 0459 10/26/19 0802 10/26/19 0802 10/27/19 0451 10/27/19 0451 10/27/19 1400 10/27/19 1730 10/27/19 1731 10/27/19 1731 10/27/19 2011 10/27/19 2122 10/28/19 0113 10/28/19 0315 10/28/19 0613  WBC 9.3   < > 6.5  --  13.1*  --  11.6*  --   --   --  13.4*  --   --   --   --  16.8*  --   NEUTROABS 7.3  --   --   --   --   --   --   --   --   --   --   --   --   --   --   --   --   HGB 8.6*   < > 13.3   < > 10.8*   < > 12.5*   < > 8.0*   < > 10.4*   < > 7.5* 6.5* 11.6* 12.8* 10.9*  HCT 27.7*   < > 41.6   < > 33.5*   < > 38.9*   < > 23.4*   < > 31.6*   < > 22.0* 19.0* 34.0* 37.5* 32.0*  MCV 101.1*   < > 98.6  --  98.2  --  99.2  --   --   --  94.0  --   --   --   --  88.4  --   PLT 150   < > 122*   < > 164  --  160  --  46*  --  93*  --   --   --   --  48*  --    < > = values in this interval not displayed.    Basic Metabolic Panel: Recent Labs  Lab 10/24/19 0737 10/24/19 0737 10/25/19 0459 10/25/19 0459 10/26/19 0802 10/26/19 0802 10/27/19 0451 10/27/19 1730 10/27/19 2011 10/27/19 2122 10/28/19 0113 10/28/19 0315 10/28/19 0613  NA 138   < > 138   < > 136   < > 140   < > 143 145 144 144 144  K 4.2   < > 4.5   < > 5.0   < > 4.4   < > 3.5 3.1* 2.9* 3.0* 3.2*  CL 98  --  99  --  95*  --  98  --   --   --   --  106  --   CO2 22  --  21*  --  20*  --  22  --   --   --   --  21*  --   GLUCOSE 134*  --  177*  --  165*  --  133*  --   --   --   --  148*  --   BUN 48*  --  34*  --  70*  --  46*  --   --   --   --  38*  --   CREATININE 14.96*  --  10.11*  --  12.80*  --  8.72*  --   --   --   --  7.21*  --   CALCIUM 9.5  --  8.6*  --  8.6*  --  8.4*  --   --   --   --  6.9*  --   MG  --   --   --   --   --   --   --   --   --   --   --  1.9  --   PHOS  --   --  3.1  --   --   --   --    --   --   --   --   --   --    < > = values in this interval not displayed.   GFR: Estimated Creatinine Clearance: 12.4 mL/min (A) (by C-G formula based on SCr of 7.21 mg/dL (H)). Recent Labs  Lab 10/26/19 0802 10/27/19 0451 10/27/19 1731 10/28/19 0315  WBC 13.1* 11.6* 13.4* 16.8*    Liver Function Tests: Recent Labs  Lab 10/25/19 0459 10/27/19 0451  AST  --  97*  ALT  --  46*  ALKPHOS  --  77  BILITOT  --  1.1  PROT  --  7.3  ALBUMIN 3.4* 3.6   No results for input(s): LIPASE, AMYLASE in the last 168 hours. No results for input(s): AMMONIA in the last 168 hours.  ABG    Component Value Date/Time   PHART 7.321 (L) 10/28/2019 0613   PCO2ART 38.9 10/28/2019 0613   PO2ART 91.0 10/28/2019 0613   HCO3 20.2 10/28/2019 0613   TCO2 21 (L) 10/28/2019 0613   ACIDBASEDEF 6.0 (H) 10/28/2019 0613   O2SAT 97.0 10/28/2019 0613     Coagulation Profile: Recent Labs  Lab 10/27/19 0451 10/27/19 1827 10/28/19 0315  INR 1.4* 2.3* 1.4*    Cardiac Enzymes: No results for input(s): CKTOTAL, CKMB, CKMBINDEX, TROPONINI in the last 168 hours.  HbA1C: Hemoglobin A1C  Date/Time Value Ref Range Status  06/07/2019 10:54 AM 6.4 (A) 4.0 - 5.6 % Final   Hgb A1c MFr Bld  Date/Time Value Ref Range Status  10/27/2019 04:51 AM 6.2 (H) 4.8 - 5.6 % Final    Comment:    (NOTE) Pre diabetes:          5.7%-6.4% Diabetes:              >6.4% Glycemic control for   <7.0% adults with diabetes   08/20/2018 07:23 AM 6.9 (H) 4.8 - 5.6 % Final    Comment:    (NOTE) Pre diabetes:          5.7%-6.4% Diabetes:              >6.4% Glycemic control for   <7.0% adults with diabetes     CBG: Recent Labs  Lab 10/28/19 0200 10/28/19 0314 10/28/19 0411 10/28/19 0611 10/28/19 0704  GLUCAP 149* 137* 128* 124* 130*    Past Medical History  He,  has a past medical history of Arthritis, Atrial flutter ([place]), Coronary artery disease, Erectile dysfunction, ESRD (end stage renal disease) on  dialysis ([place]), Essential hypertension, Gout, Hernia of abdominal wall, History of blood transfusion, History of cardiomyopathy, History of kidney stones, History of pneumonia, Migraine, Myocardial infarction ([place]), Pneumonia, Secondary hyperparathyroidism ([place]), Type 2 diabetes mellitus ([place]), and Wears glasses.   Surgical History    Past Surgical History:  Procedure Laterality Date  . A-FLUTTER ABLATION N/A 09/24/2016   Procedure: A-Flutter Ablation;  Surgeon: Will Meredith Leeds, MD;  Location: [place]  CV LAB;  Service: Cardiovascular;  Laterality: N/A;  . ABDOMINAL AORTOGRAM W/LOWER EXTREMITY N/A 04/13/2017   Procedure: ABDOMINAL AORTOGRAM W/LOWER EXTREMITY;  Surgeon: Conrad South Williamson, MD;  Location: [place] [facility];  Service: Cardiovascular;  Laterality: N/A;  Bilater lower extermity  . APPENDECTOMY    . AV FISTULA PLACEMENT Right 09/18/2014   Procedure: INSERTION OF ARTERIOVENOUS (AV) GORE-TEX GRAFT ARM USING 4-7MM  X 45CM STRETCH GORE-TEX VASCULAR GRAFT;  Surgeon: Rosetta Posner, MD;  Location: [place];  Service: Vascular;  Laterality: Right;  . AV FISTULA PLACEMENT Left 07/07/2016   Procedure: INSERTION OF LEFT BRACHIAL TO AXILLARY ARTERIOVENOUS (AV) GORE-TEX ARM GRAFT;  Surgeon: Conrad [place], MD;  Location: [place];  Service: Vascular;  Laterality: Left;  . AV FISTULA PLACEMENT Left 12/09/2018   Procedure: Insertion Of Arteriovenous (Av) Gore-Tex Graft Arm, left arm;  Surgeon: Marty Heck, MD;  Location: [place];  Service: Vascular;  Laterality: Left;  . CARDIAC CATHETERIZATION  ~ 2016  . COLONOSCOPY    . 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 CORONARY ATHERECTOMY N/A 02/16/2019   Procedure: CORONARY ATHERECTOMY;  Surgeon: Martinique, Peter M, MD;  Location: [place] [facility];  Service: Cardiovascular;  Laterality: N/A;  . EXCHANGE OF A DIALYSIS CATHETER Left 01/11/2018   Procedure: EXCHANGE OF TUNNELED  DIALYSIS CATHETER;  Surgeon: Rosetta Posner, MD;  Location: [place];  Service: Vascular;  Laterality: Left;  . HERNIA REPAIR  2017   with nephrectomy  . INSERTION OF DIALYSIS CATHETER N/A 10/08/2017   Procedure: INSERTION OF TUNNELED DIALYSIS CATHETER;  Surgeon: Conrad Gooding, MD;  Location: [place];  Service: Vascular;  Laterality: N/A;  . IR FLUORO GUIDE CV LINE LEFT  03/12/2018  . IR GENERIC HISTORICAL  05/11/2016   IR FLUORO GUIDE CV LINE LEFT 05/11/2016 Corrie Mckusick, DO [facility]  . IR GENERIC HISTORICAL  05/11/2016   IR US GUIDE VASC ACCESS LEFT 05/11/2016 Corrie Mckusick, DO [facility]  . IR GENERIC HISTORICAL  05/11/2016   IR US GUIDE VASC ACCESS RIGHT 05/11/2016 Corrie Mckusick, DO [facility]  . IR GENERIC HISTORICAL  05/11/2016   IR RADIOLOGY PERIPHERAL GUIDED IV START 05/11/2016 Corrie Mckusick, DO [facility]  . KIDNEY TRANSPLANT  2009  . LEFT HEART CATH AND CORONARY ANGIOGRAPHY N/A 02/15/2019   Procedure: LEFT HEART CATH AND CORONARY ANGIOGRAPHY;  Surgeon: Martinique, Peter M, MD;  Location: [place] [facility];  Service: Cardiovascular;  Laterality: N/A;  . LEFT HEART CATH AND CORS/GRAFTS ANGIOGRAPHY N/A 12/04/2017   Procedure: LEFT HEART CATH AND CORS/GRAFTS ANGIOGRAPHY;  Surgeon: Belva Crome, MD;  Location: [place] [facility];  Service: Cardiovascular;  Laterality: N/A;  . LEFT HEART CATH AND CORS/GRAFTS ANGIOGRAPHY N/A 10/25/2019   Procedure: LEFT HEART CATH AND CORS/GRAFTS ANGIOGRAPHY;  Surgeon: Martinique, Peter M, MD;  Location: [place] [facility];  Service: Cardiovascular;  Laterality: N/A;  . NEPHRECTOMY  2017   transplant rejected   . PERIPHERAL VASCULAR CATHETERIZATION N/A 06/04/2016   Procedure: Upper Extremity Venography;  Surgeon: Waynetta Sandy, MD;  Location: [place] [facility];  Service: Cardiovascular;  Laterality: N/A;  . PERIPHERAL VASCULAR INTERVENTION  04/13/2017   Procedure: PERIPHERAL VASCULAR INTERVENTION;  Surgeon: Conrad [place], MD;  Location: [place]  CV LAB;  Service: Cardiovascular;;  Lt. Common/Exernal  Iliac  . THROMBECTOMY AND REVISION OF ARTERIOVENTOUS (AV) GORETEX  GRAFT Left 10/08/2017  Procedure: THROMBECTOMY of ARTERIOVENTOUS (AV) GORETEX  GRAFT LEFT UPPER ARM;  Surgeon: Conrad Plevna, MD;  Location: [place];  Service: Vascular;  Laterality: Left;  . THROMBECTOMY W/ EMBOLECTOMY Left 09/14/2017   Procedure: THROMBECTOMY ARTERIOVENOUS GORE-TEX GRAFT LEFT UPPER ARM;  Surgeon: Rosetta Posner, MD;  Location: [place];  Service: Vascular;  Laterality: Left;  . UPPER EXTREMITY ANGIOGRAPHY Bilateral 10/29/2018   Procedure: UPPER EXTREMITY ANGIOGRAPHY;  Surgeon: Marty Heck, MD;  Location: [place] [facility];  Service: Cardiovascular;  Laterality: Bilateral;  . UPPER EXTREMITY VENOGRAPHY N/A 11/16/2017   Procedure: UPPER EXTREMITY VENOGRAPHY - Right Arm;  Surgeon: Conrad Campbell, MD;  Location: [place] [facility];  Service: Cardiovascular;  Laterality: N/A;  . UPPER EXTREMITY VENOGRAPHY Bilateral 10/29/2018   Procedure: UPPER EXTREMITY VENOGRAPHY;  Surgeon: Marty Heck, MD;  Location: [place] [facility];  Service: Cardiovascular;  Laterality: Bilateral;     Social History   reports that he quit smoking about 5 years ago. His smoking use included cigarettes. He has never used smokeless tobacco. He reports current alcohol use. He reports that he does not use drugs.   Family History   His family history includes HIV in his sister; Hyperlipidemia in his mother; Hypertension in his mother.   Allergies Allergies  Allergen Reactions  . Baclofen Other (See Comments)    Possibly stroke like symptoms  . Iodinated Diagnostic Agents Swelling, Rash and Other (See Comments)    Other Reaction: burning to mouth, swelling of lips  . Lipitor [Atorvastatin] Other (See Comments)    Leg pain  . Metoprolol Other (See Comments)    Headaches      Home Medications  Prior to Admission medications   Medication Sig Start Date End Date Taking?  Authorizing Provider  acetaminophen (TYLENOL) 500 MG tablet Take 1,000 mg by mouth every 6 (six) hours as needed for moderate pain or headache.   Yes [provider]  albuterol (PROVENTIL HFA;VENTOLIN HFA) 108 (90 Base) MCG/ACT inhaler Inhale 1-2 puffs into the lungs every 6 (six) hours as needed for wheezing or shortness of breath. 12/18/17  Yes Richardson Dopp T, PA-C  aspirin EC 81 MG tablet Take 81 mg by mouth daily.    Yes [provider]  calcium acetate (PHOSLO) 667 MG capsule Take 1,334-2,001 mg by mouth See admin instructions. 3 capsules with dinner and 2 capsules with snacks   Yes [provider]  calcium carbonate (OS-CAL - DOSED IN MG OF ELEMENTAL CALCIUM) 1250 (500 Ca) MG tablet Take 2 tablets by mouth daily.  11/29/13  Yes [provider]  clopidogrel (PLAVIX) 75 MG tablet TAKE 1 TABLET(75 MG) BY MOUTH DAILY Patient taking differently: Take 75 mg by mouth daily.  03/03/19  Yes Richardson Dopp T, PA-C  Colchicine 0.6 MG CAPS Take 0.6 mg by mouth See admin instructions. Take 0.6 mg when first symptoms of gout flare occur, then take another 0.6 mg 14 days later as needed for gout 10/13/18  Yes [provider]  dextromethorphan (DELSYM) 30 MG/5ML liquid Take 30 mg by mouth as needed for cough.   Yes [provider]  Dextromethorphan-guaiFENesin 5-100 MG/5ML LIQD Take 15 mLs by mouth 2 (two) times daily as needed (Cough).  06/03/18  Yes [provider]  docusate sodium (COLACE) 100 MG capsule Take 100 mg by mouth daily as needed for mild constipation.   Yes [provider]  ezetimibe (ZETIA) 10 MG tablet TAKE 1 TABLET(10 MG) BY MOUTH  DAILY Patient taking differently: Take 10 mg by mouth daily.  05/09/19  Yes Weaver, Scott T, PA-C  isosorbide mononitrate (IMDUR) 30 MG 24 hr tablet Take 1 tablet (30 mg total) by mouth daily. 02/24/19  Yes Daune Perch, NP  lidocaine-prilocaine (EMLA) cream Apply 1 application topically daily as  needed (Numbing).  03/21/19  Yes [provider]  metoprolol succinate (TOPROL XL) 25 MG 24 hr tablet Take 1 tablet (25 mg total) by mouth daily. 05/05/19  Yes Daune Perch, NP  multivitamin (RENA-VIT) TABS tablet Take 1 tablet by mouth daily.    Yes [provider]  neomycin-bacitracin-polymyxin (NEOSPORIN) ointment Apply 1 application topically 2 (two) times daily. Apply to feet.   Yes [provider]  nitroGLYCERIN (NITROSTAT) 0.4 MG SL tablet Place 1 tablet (0.4 mg total) under the tongue every 5 (five) minutes as needed for chest pain. 09/16/19 12/15/19 Yes Fay Records, MD  repaglinide (PRANDIN) 0.5 MG tablet Take 1 tablet (0.5 mg total) by mouth 2 (two) times daily before a meal. 08/23/19  Yes Renato Shin, MD  rosuvastatin (CRESTOR) 10 MG tablet Take 1 tablet (10 mg total) by mouth daily. 02/24/19  Yes Daune Perch, NP  silver sulfADIAZINE (SILVADENE) 1 % cream Apply 1 application topically daily. Patient taking differently: Apply 1 application topically 2 (two) times daily.  10/18/19  Yes Trula Slade, DPM  traMADol (ULTRAM) 50 MG tablet Take 50 mg by mouth 2 (two) times daily as needed for moderate pain.  05/19/18  Yes [provider]  doxycycline (VIBRA-TABS) 100 MG tablet Take 1 tablet (100 mg total) by mouth 2 (two) times daily. Patient not taking: Reported on 10/24/2019 09/06/19   Trula Slade, DPM  Methoxy PEG-Epoetin Beta (MIRCERA IJ) Mircera 09/05/19 09/03/20  [provider]  oxyCODONE-acetaminophen (PERCOCET/ROXICET) 5-325 MG tablet Take 1 tablet by mouth every 6 (six) hours as needed. Patient not taking: Reported on 10/24/2019 12/09/18   Ulyses Amor, PA-C  pantoprazole (PROTONIX) 40 MG tablet Take 1 tablet (40 mg total) by mouth daily. Patient not taking: Reported on 10/24/2019 11/08/18   Leanor Kail, PA     Mitzi Hansen, MD INTERNAL MEDICINE RESIDENT PGY-1 10/28/19  7:59 AM

## 2019-10-28 NOTE — Anesthesia Postprocedure Evaluation (Signed)
Anesthesia Post Note  Patient: Patrick [name]  Procedure(s) Performed: REDO CORONARY ARTERY BYPASS GRAFTING TIMES 3  USING LEFT GREATER SAPHENOUS LEG VEIN HARVESTED ENDOSCOPICALLY(CABG) (N/A Chest) TRANSESOPHAGEAL ECHOCARDIOGRAM (TEE) (N/A ) AORTIC VALVE REPLACEMENT (AVR) USING EDWARDS INTUITY 23 MM AORTIC VALVE. (N/A Chest) Endovein Harvest Of Greater Saphenous Vein (Left Leg Upper)     Patient location during evaluation: SICU Anesthesia Type: General Level of consciousness: sedated and patient remains intubated per anesthesia plan Pain management: pain level controlled Vital Signs Assessment: post-procedure vital signs reviewed and stable Respiratory status: patient remains intubated per anesthesia plan and patient on ventilator - see flowsheet for VS Cardiovascular status: stable (on pressors) Postop Assessment: no apparent nausea or vomiting Anesthetic complications: no Comments: On CVVH today    Last Vitals:  Vitals:   10/28/19 1715 10/28/19 1730  BP:    Pulse: 90 90  Resp: (!) 36 (!) 60  Temp: (!) 35.5 C (!) 35.6 C  SpO2: 95% 95%    Last Pain:  Vitals:   10/28/19 0730  TempSrc: Axillary  PainSc:                  Patrick [name],Patrick [name]

## 2019-10-28 NOTE — Progress Notes (Signed)
CRITICAL VALUE ALERT  Critical Value:  Platelets 29  Date & Time Notied:  10/28/19 1746  Provider Notified: Dr. Orvan Seen  Orders Received/Actions taken: HIT panel sent.

## 2019-10-28 NOTE — Progress Notes (Signed)
Initial Nutrition Assessment  DOCUMENTATION CODES:   Not applicable  INTERVENTION:   Tube Feeding:  Vital AF 1.2 at 50 ml/hr Pro-Stat 30 mL TID Provides 135 g of protein, 1740 kcals and 972 mL of free water Meets 100% protein needs, 105% calorie needs  Add Rena-Vit daily (renal MVI)   NUTRITION DIAGNOSIS:   Inadequate oral intake related to acute illness as evidenced by NPO status.  GOAL:   Patient will meet greater than or equal to 90% of their needs  MONITOR:   Vent status, Labs, Weight trends, TF tolerance  REASON FOR ASSESSMENT:   Ventilator    ASSESSMENT:   64 yo male admitted with NSTEMI and found to have mutlivessel CAD and underwent CABG on 4/1, ESRD/HD requiring CRRT. PMH includes ESRD on HD s/p renal transplant, CAD s/p CABG, CHF, DM   3/29 Admission 3/30 Cardiac Cath 4/01 CABG with IABP placement, Intubated 4/02 CRRT initiated  Patient is currently intubated on ventilator support, sedated with precedex, requiring levophed, epinephrine, vasopressin, milrinone drips MV: 9.8 L/min Temp (24hrs), Avg:97.7 F (36.5 C), Min:96.3 F (35.7 C), Max:98.4 F (36.9 C)  OG tube in place  Unable to obtain diet and weight history from patient at this time   Outpatient EDW 99.5 kg. Noted weight of 91.6 kg on 3/31. Noted nephrology suspecting that pt with loss of lean body mass; pt needs Nutrition-Focused Physical Exam on follow-up  Labs: potassium wdl, sodium 145 (wdl), ionized calcium 0.88 (L), no phosphorus Meds: insulin drip  Diet Order:   Diet Order    None      EDUCATION NEEDS:   Not appropriate for education at this time  Skin:  Skin Assessment: Skin Integrity Issues: Skin Integrity Issues:: DTI DTI: multiple toes  Last BM:  3/31  Height:   Ht Readings from Last 1 Encounters:  10/28/19 5' 6.5" (1.689 m)    Weight:   Wt Readings from Last 1 Encounters:  10/28/19 117.7 kg    BMI:  Body mass index is 41.25 kg/m.  Estimated  Nutritional Needs:   Kcal:  1650 kcals  Protein:  130-160 g  Fluid:  1000 plus UOP   Kerman Passey MS, RDN, LDN, CNSC RD Pager Number and Weekend/On-Call After Hours Pager Located in [place]

## 2019-10-28 NOTE — Progress Notes (Addendum)
[facility] Progress Note   Subjective:  Had a CABG from 4/1 - off the floor for same yesterday when attempted to see.  BP noted to be labile post-op per charting.  He did receive multiple units of PRBC's, platelets, FFP and cryo per op note.  He has been on levo at 13, vasopressin, epi at 6, and milrinone and has a balloon pump.  Has been on bicarb at 75 ml/hr.  Started to wake up a bit this AM per nurse.  Review of systems: Unable to obtain 2/2 intubated   Objective Vitals:   10/28/19 0413 10/28/19 0424 10/28/19 0500 10/28/19 0530  BP:  (!) 146/54    Pulse: (!) 114 (!) 109  (!) 108  Resp: (!) 0 18  (!) 0  Temp:      TempSrc:      SpO2: 99% 99%  97%  Weight:   117.7 kg   Height:        Weight change: 26.1 kg   Additional Objective Labs: Basic Metabolic Panel: Recent Labs  Lab 10/25/19 0459 10/25/19 0459 10/26/19 0802 10/26/19 0802 10/27/19 0451 10/27/19 1730 10/27/19 2122 10/28/19 0113 10/28/19 0315  NA 138   < > 136   < > 140   < > 145 144 144  K 4.5   < > 5.0   < > 4.4   < > 3.1* 2.9* 3.0*  CL 99   < > 95*  --  98  --   --   --  106  CO2 21*   < > 20*  --  22  --   --   --  21*  GLUCOSE 177*   < > 165*  --  133*  --   --   --  148*  BUN 34*   < > 70*  --  46*  --   --   --  38*  CREATININE 10.11*   < > 12.80*  --  8.72*  --   --   --  7.21*  CALCIUM 8.6*   < > 8.6*  --  8.4*  --   --   --  6.9*  PHOS 3.1  --   --   --   --   --   --   --   --    < > = values in this interval not displayed.   CBC: Recent Labs  Lab 10/24/19 0737 10/24/19 0737 10/25/19 0459 10/25/19 0459 10/26/19 0802 10/26/19 0802 10/27/19 0451 10/27/19 0451 10/27/19 1400 10/27/19 1730 10/27/19 1731 10/27/19 2011 10/27/19 2122 10/28/19 0113 10/28/19 0315  WBC 9.3   < > 6.5   < > 13.1*   < > 11.6*  --   --   --  13.4*  --   --   --  16.8*  NEUTROABS 7.3  --   --   --   --   --   --   --   --   --   --   --   --   --   --   HGB 8.6*   < > 13.3   < > 10.8*   < > 12.5*    < > 8.0*   < > 10.4*   < > 6.5* 11.6* 12.8*  HCT 27.7*   < > 41.6   < > 33.5*   < > 38.9*   < > 23.4*   < > 31.6*   < >  19.0* 34.0* 37.5*  MCV 101.1*   < > 98.6  --  98.2  --  99.2  --   --   --  94.0  --   --   --  88.4  PLT 150   < > 122*   < > 164   < > 160   < > 46*  --  93*  --   --   --  48*   < > = values in this interval not displayed.   Blood Culture    Component Value Date/Time   SDES WOUND ABDOMEN 10/02/2018 1426   SDES WOUND ABDOMEN 10/02/2018 1426   SPECREQUEST Normal 10/02/2018 1426   SPECREQUEST Normal 10/02/2018 1426   CULT  10/02/2018 1426    No growth aerobically or anaerobically. Performed at [facility], [place] [address], [place], [place] [address]    CULT  10/02/2018 1426    No growth aerobically or anaerobically. Performed at [facility], [place] [address], [place], [place] [address]    REPTSTATUS 10/07/2018 FINAL 10/02/2018 1426   REPTSTATUS 10/07/2018 FINAL 10/02/2018 1426     Physical Exam   General: adult male intubated on pressors  HEENT NCAT; periorbital edema Heart: tachycardic at 105, IABP  Lungs: coarse mechanical breath sounds Abdomen: soft obese habitus  Extremities: no LE or UE edema   Neuro - continuous sedation running Dialysis Access: L IJ TDC; LUE AVG clotted, not in use   Medications: . sodium chloride Stopped (10/28/19 0357)  . sodium chloride    . sodium chloride Stopped (10/28/19 0326)  . albumin human 12.5 g (10/27/19 1903)  . cefUROXime (ZINACEF)  IV    . dexmedetomidine (PRECEDEX) IV infusion 0.9 mcg/kg/hr (10/28/19 0500)  . epinephrine 6 mcg/min (10/28/19 0500)  . famotidine (PEPCID) IV Stopped (10/27/19 2307)  . insulin 2.2 mL/hr at 10/28/19 0500  . lactated ringers    . lactated ringers    . lactated ringers 20 mL/hr at 10/28/19 0500  . magnesium sulfate    . milrinone 0.125 mcg/kg/min (10/28/19 0500)  . nitroGLYCERIN Stopped (10/27/19 1722)  . norepinephrine (LEVOPHED) Adult infusion 13 mcg/min (10/28/19  0500)  . phenylephrine (NEO-SYNEPHRINE) Adult infusion    .  sodium bicarbonate  infusion 1000 mL 75 mL/hr at 10/28/19 0400  . vasopressin (PITRESSIN) infusion - *FOR SHOCK* 0.04 Units/min (10/28/19 0500)   . acetaminophen  1,000 mg Oral Q6H   Or  . acetaminophen (TYLENOL) oral liquid 160 mg/5 mL  1,000 mg Per Tube Q6H  . aspirin EC  325 mg Oral Daily   Or  . aspirin  324 mg Per Tube Daily  . bisacodyl  10 mg Oral Daily   Or  . bisacodyl  10 mg Rectal Daily  . chlorhexidine gluconate (MEDLINE KIT)  15 mL Mouth Rinse BID  . Chlorhexidine Gluconate Cloth  6 each Topical Daily  . docusate sodium  200 mg Oral Daily  . levalbuterol  0.63 mg Nebulization Q6H  . mouth rinse  15 mL Mouth Rinse 10 times per day  . metoprolol tartrate  12.5 mg Oral BID   Or  . metoprolol tartrate  12.5 mg Per Tube BID  . [START ON 10/29/2019] pantoprazole  40 mg Oral Daily  . potassium chloride  20 mEq Per Tube Q4H  . rosuvastatin  10 mg Oral q1800  . sodium chloride flush  10-40 mL Intracatheter Q12H  . sodium chloride flush  3 mL Intravenous Q12H  Dialysis Orders:  [facility] MWF 4h 400/800 EDW 99.5kg 2K/3.5Ca UFP 4 TDC Heparin 3000 -Mircera 75 (last 3/22) Hectorol 1   Assessment/Plan: 1. Chest pain/ACS/Elevated troponin. Known CAD s/p CAGB x1 1997,  DESx 2 2020. LHC 3/30 showing severe 3 vessel disease not amenable to PCI.  1. S/p CABG on 4/1  2. Please avoid morphine for pain given ESRD - if IV agent needed would rec fentanyl or dilaudid  2. ESRD  1. Usually HD MWF 2. Will transition to CRRT today and then assess renal replacement needs daily.  To start, UF goal keep even then after 6 hours increase to net negative 50 an hour as tolerated   3. Stop bicarbonate gtt 4. Note hypotension this admission.   Also with suspected loss of body mass and had challenged EDW this hospitalization to optimize resp status pre-op - had presented in distress which improved with fluid removal 3. Cardiogenic shock - IABP  and pressors per CT surgery.  4. Acute hypoxic respiratory failure - on mechanical ventilation 5. Anemia  -  Hgb 8.6>13.3>10.8. Tsat 16% Ferritin 1293.  Received 1 unit prbcs 3/29. On ESA as outpatient dosed 3/22. Follow Hgb trend. Keep Hgb >8 with CAD  6. Metabolic bone disease -  On added Ca bath as outpatient.Stopped binder for CRRT.  On hectorol   Claudia Desanctis 10/28/2019 6:03 AM

## 2019-10-28 NOTE — Plan of Care (Signed)
Started on trickle feeds today. Plan to progress TF tomorrow if tolerates. BS present/hypoactive.  CRRT with goal of net even to -50. On vaso, levo, and epi gtts; gave alb 250cc once.  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  Precedex and fentanyl gtts for sedation.   IABP was 2:1 most of day, 1:1 now per MD.  Pt's wife and daughter updated on plan before they left for the night.  Problem: Education: Goal: Ability to demonstrate management of disease process will improve Outcome: Progressing Goal: Ability to verbalize understanding of medication therapies will improve Outcome: Progressing Goal: Individualized Educational Video(s) Outcome: Progressing   Problem: Activity: Goal: Capacity to carry out activities will improve Outcome: Progressing   Problem: Cardiac: Goal: Ability to achieve and maintain adequate cardiopulmonary perfusion will improve Outcome: Progressing   Problem: Education: Goal: Knowledge of General Education information will improve Description: Including pain rating scale, medication(s)/side effects and non-pharmacologic comfort measures Outcome: Progressing   Problem: Health Behavior/Discharge Planning: Goal: Ability to manage health-related needs will improve Outcome: Progressing   Problem: Clinical Measurements: Goal: Ability to maintain clinical measurements within normal limits will improve Outcome: Progressing Goal: Will remain free from infection Outcome: Progressing Goal: Diagnostic test results will improve Outcome: Progressing Goal: Respiratory complications will improve Outcome: Progressing Goal: Cardiovascular complication will be avoided Outcome: Progressing   Problem: Activity: Goal: Risk for activity intolerance will decrease Outcome: Progressing   Problem: Nutrition: Goal: Adequate nutrition will be maintained Outcome: Progressing   Problem: Coping: Goal: Level of anxiety will decrease Outcome: Progressing    Problem: Elimination: Goal: Will not experience complications related to bowel motility Outcome: Progressing Goal: Will not experience complications related to urinary retention Outcome: Progressing   Problem: Pain Managment: Goal: General experience of comfort will improve Outcome: Progressing   Problem: Safety: Goal: Ability to remain free from injury will improve Outcome: Progressing   Problem: Skin Integrity: Goal: Risk for impaired skin integrity will decrease Outcome: Progressing

## 2019-10-28 NOTE — Progress Notes (Signed)
PHARMACY NOTE:  ANTIMICROBIAL RENAL DOSAGE ADJUSTMENT  Current antimicrobial regimen includes a mismatch between antimicrobial dosage and estimated renal function.  As per policy approved by the Pharmacy & Therapeutics and Medical Executive Committees, the antimicrobial dosage will be adjusted accordingly.  Current antimicrobial dosage:  Cefuroxime 1.5g q24h x3 doses  Indication: surgical ppx  Renal Function:  Estimated Creatinine Clearance: 12.4 mL/min (A) (by C-G formula based on SCr of 7.21 mg/dL (H)). []      On intermittent HD, scheduled: [x]      On CRRT    Antimicrobial dosage has been changed to:  Cefuroxime 1.5g IV q12h x3 doses   Thank you for allowing pharmacy to be a part of this patient's care.  Arrie Senate, PharmD, BCPS Clinical Pharmacist [phone] Please check AMION for all [facility] numbers 10/28/2019

## 2019-10-28 NOTE — Progress Notes (Signed)
1 Day Post-Op Procedure(s) (LRB): REDO CORONARY ARTERY BYPASS GRAFTING TIMES 3  USING LEFT GREATER SAPHENOUS LEG VEIN HARVESTED ENDOSCOPICALLY(CABG) (N/A) TRANSESOPHAGEAL ECHOCARDIOGRAM (TEE) (N/A) AORTIC VALVE REPLACEMENT (AVR) USING EDWARDS INTUITY 23 MM AORTIC VALVE. (N/A) Endovein Harvest Of Greater Saphenous Vein (Left) Subjective: Intubated/sedated  Objective: Vital signs in last 24 hours: Temp:  [97.7 F (36.5 C)-98.4 F (36.9 C)] 97.9 F (36.6 C) (04/02 0630) Pulse Rate:  [63-133] 98 (04/02 0700) Cardiac Rhythm: Sinus tachycardia (04/01 2000) Resp:  [0-25] 7 (04/02 0700) BP: (102-161)/(31-74) 146/54 (04/02 0424) SpO2:  [81 %-100 %] 96 % (04/02 0700) Arterial Line BP: (102-123)/(51-59) 121/57 (04/01 0802) FiO2 (%):  [40 %-70 %] 40 % (04/02 0424) Weight:  [117.7 kg] 117.7 kg (04/02 0500)  Hemodynamic parameters for last 24 hours: CVP:  [5 mmHg] 5 mmHg  Intake/Output from previous day: 04/01 0701 - 04/02 0700 In: 65537.4 [I.V.:5169.1; MOLMB:8675; NG/GT:220; IV Piggyback:2509.4] Out: 2603 [Blood:1273; Chest Tube:1330] Intake/Output this shift: No intake/output data recorded.  General appearance: sedated but responsive overnight Neurologic: unable to fully assess Heart: regular rate and rhythm, S1, S2 normal, no murmur, click, rub or gallop Lungs: diminished breath sounds bilaterally Abdomen: soft, non-tender; bowel sounds normal; no masses,  no organomegaly Extremities: edema 1+ Wound: dressed, dry  Lab Results: Recent Labs    10/27/19 1731 10/27/19 2011 10/28/19 0315 10/28/19 0613  WBC 13.4*  --  16.8*  --   HGB 10.4*   < > 12.8* 10.9*  HCT 31.6*   < > 37.5* 32.0*  PLT 93*  --  48*  --    < > = values in this interval not displayed.   BMET:  Recent Labs    10/27/19 0451 10/27/19 1730 10/28/19 0315 10/28/19 0613  NA 140   < > 144 144  K 4.4   < > 3.0* 3.2*  CL 98  --  106  --   CO2 22  --  21*  --   GLUCOSE 133*  --  148*  --   BUN 46*  --  38*   --   CREATININE 8.72*  --  7.21*  --   CALCIUM 8.4*  --  6.9*  --    < > = values in this interval not displayed.    PT/INR:  Recent Labs    10/28/19 0315  LABPROT 17.0*  INR 1.4*   ABG    Component Value Date/Time   PHART 7.321 (L) 10/28/2019 0613   HCO3 20.2 10/28/2019 0613   TCO2 21 (L) 10/28/2019 0613   ACIDBASEDEF 6.0 (H) 10/28/2019 0613   O2SAT 97.0 10/28/2019 0613   CBG (last 3)  Recent Labs    10/28/19 0411 10/28/19 0611 10/28/19 0704  GLUCAP 128* 124* 130*    Assessment/Plan: S/P Procedure(s) (LRB): REDO CORONARY ARTERY BYPASS GRAFTING TIMES 3  USING LEFT GREATER SAPHENOUS LEG VEIN HARVESTED ENDOSCOPICALLY(CABG) (N/A) TRANSESOPHAGEAL ECHOCARDIOGRAM (TEE) (N/A) AORTIC VALVE REPLACEMENT (AVR) USING EDWARDS INTUITY 23 MM AORTIC VALVE. (N/A) Endovein Harvest Of Greater Saphenous Vein (Left) Diuresis wean IABP to off; keep intubated until IABP removed Wean drips as tolerated Continue bicarb drip until CVVHD in line    LOS: 4 days    Wonda Olds 10/28/2019

## 2019-10-29 ENCOUNTER — Inpatient Hospital Stay (HOSPITAL_COMMUNITY): Payer: Medicare HMO

## 2019-10-29 ENCOUNTER — Encounter (HOSPITAL_COMMUNITY): Payer: Self-pay | Admitting: Cardiothoracic Surgery

## 2019-10-29 DIAGNOSIS — I5021 Acute systolic (congestive) heart failure: Secondary | ICD-10-CM | POA: Diagnosis not present

## 2019-10-29 DIAGNOSIS — J9601 Acute respiratory failure with hypoxia: Secondary | ICD-10-CM

## 2019-10-29 DIAGNOSIS — D649 Anemia, unspecified: Secondary | ICD-10-CM

## 2019-10-29 LAB — GLUCOSE, CAPILLARY
Glucose-Capillary: 114 mg/dL — ABNORMAL HIGH (ref 70–99)
Glucose-Capillary: 118 mg/dL — ABNORMAL HIGH (ref 70–99)
Glucose-Capillary: 119 mg/dL — ABNORMAL HIGH (ref 70–99)
Glucose-Capillary: 124 mg/dL — ABNORMAL HIGH (ref 70–99)
Glucose-Capillary: 129 mg/dL — ABNORMAL HIGH (ref 70–99)
Glucose-Capillary: 131 mg/dL — ABNORMAL HIGH (ref 70–99)
Glucose-Capillary: 131 mg/dL — ABNORMAL HIGH (ref 70–99)
Glucose-Capillary: 132 mg/dL — ABNORMAL HIGH (ref 70–99)
Glucose-Capillary: 132 mg/dL — ABNORMAL HIGH (ref 70–99)
Glucose-Capillary: 143 mg/dL — ABNORMAL HIGH (ref 70–99)
Glucose-Capillary: 148 mg/dL — ABNORMAL HIGH (ref 70–99)
Glucose-Capillary: 152 mg/dL — ABNORMAL HIGH (ref 70–99)
Glucose-Capillary: 153 mg/dL — ABNORMAL HIGH (ref 70–99)
Glucose-Capillary: 158 mg/dL — ABNORMAL HIGH (ref 70–99)
Glucose-Capillary: 160 mg/dL — ABNORMAL HIGH (ref 70–99)
Glucose-Capillary: 161 mg/dL — ABNORMAL HIGH (ref 70–99)
Glucose-Capillary: 165 mg/dL — ABNORMAL HIGH (ref 70–99)
Glucose-Capillary: 170 mg/dL — ABNORMAL HIGH (ref 70–99)

## 2019-10-29 LAB — CBC
HCT: 34.8 % — ABNORMAL LOW (ref 39.0–52.0)
Hemoglobin: 11.6 g/dL — ABNORMAL LOW (ref 13.0–17.0)
MCH: 29.8 pg (ref 26.0–34.0)
MCHC: 33.3 g/dL (ref 30.0–36.0)
MCV: 89.5 fL (ref 80.0–100.0)
Platelets: 28 10*3/uL — CL (ref 150–400)
RBC: 3.89 MIL/uL — ABNORMAL LOW (ref 4.22–5.81)
RDW: 17.3 % — ABNORMAL HIGH (ref 11.5–15.5)
WBC: 14.9 10*3/uL — ABNORMAL HIGH (ref 4.0–10.5)
nRBC: 3.8 % — ABNORMAL HIGH (ref 0.0–0.2)

## 2019-10-29 LAB — RENAL FUNCTION PANEL
Albumin: 3 g/dL — ABNORMAL LOW (ref 3.5–5.0)
Albumin: 2.6 g/dL — ABNORMAL LOW (ref 3.5–5.0)
Anion gap: 13 (ref 5–15)
Anion gap: 10 (ref 5–15)
BUN: 27 mg/dL — ABNORMAL HIGH (ref 8–23)
BUN: 21 mg/dL (ref 8–23)
CO2: 22 mmol/L (ref 22–32)
CO2: 23 mmol/L (ref 22–32)
Calcium: 7.2 mg/dL — ABNORMAL LOW (ref 8.9–10.3)
Calcium: 7.2 mg/dL — ABNORMAL LOW (ref 8.9–10.3)
Chloride: 104 mmol/L (ref 98–111)
Chloride: 105 mmol/L (ref 98–111)
Creatinine, Ser: 4.48 mg/dL — ABNORMAL HIGH (ref 0.61–1.24)
Creatinine, Ser: 3.61 mg/dL — ABNORMAL HIGH (ref 0.61–1.24)
GFR calc Af Amer: 15 mL/min — ABNORMAL LOW (ref >60)
GFR calc Af Amer: 19 mL/min — ABNORMAL LOW (ref >60)
GFR calc non Af Amer: 13 mL/min — ABNORMAL LOW (ref >60)
GFR calc non Af Amer: 17 mL/min — ABNORMAL LOW (ref >60)
Glucose, Bld: 149 mg/dL — ABNORMAL HIGH (ref 70–99)
Glucose, Bld: 183 mg/dL — ABNORMAL HIGH (ref 70–99)
Phosphorus: 3.7 mg/dL (ref 2.5–4.6)
Phosphorus: 2.9 mg/dL (ref 2.5–4.6)
Potassium: 4.8 mmol/L (ref 3.5–5.1)
Potassium: 4.9 mmol/L (ref 3.5–5.1)
Sodium: 139 mmol/L (ref 135–145)
Sodium: 138 mmol/L (ref 135–145)

## 2019-10-29 LAB — HEPARIN INDUCED PLATELET AB (HIT ANTIBODY): Heparin Induced Plt Ab: 0.072 OD (ref 0.000–0.400)

## 2019-10-29 LAB — MAGNESIUM
Magnesium: 2.2 mg/dL (ref 1.7–2.4)
Magnesium: 2.3 mg/dL (ref 1.7–2.4)

## 2019-10-29 LAB — APTT
aPTT: 64 seconds — ABNORMAL HIGH (ref 24–36)
aPTT: 74 seconds — ABNORMAL HIGH (ref 24–36)
aPTT: 68 seconds — ABNORMAL HIGH (ref 24–36)
aPTT: 61 seconds — ABNORMAL HIGH (ref 24–36)

## 2019-10-29 MED ORDER — SODIUM CHLORIDE 0.9 % IV SOLN
INTRAVENOUS | Status: DC | PRN
  Administered 2019-10-29: 500 mL via INTRAVENOUS
  Administered 2019-10-31: 1000 mL via INTRAVENOUS
  Administered 2019-11-06: 500 mL via INTRAVENOUS
  Administered 2019-11-08 – 2019-11-16 (×5): 250 mL via INTRAVENOUS

## 2019-10-29 MED ORDER — CHLORHEXIDINE GLUCONATE 0.12 % MT SOLN
OROMUCOSAL | Status: AC
  Administered 2019-10-29: 15 mL via OROMUCOSAL
  Filled 2019-10-29: qty 15

## 2019-10-29 MED ORDER — CHLORHEXIDINE GLUCONATE CLOTH 2 % EX PADS
6.0000 | MEDICATED_PAD | Freq: Every day | CUTANEOUS | Status: DC
  Administered 2019-10-30 – 2019-11-05 (×7): 6 via TOPICAL

## 2019-10-29 MED ORDER — CALCIUM GLUCONATE-NACL 1-0.675 GM/50ML-% IV SOLN
1.0000 g | Freq: Once | INTRAVENOUS | Status: AC
  Administered 2019-10-29: 1000 mg via INTRAVENOUS
  Filled 2019-10-29: qty 50

## 2019-10-29 NOTE — Op Note (Signed)
CARDIOTHORACIC SURGERY OPERATIVE NOTE  Date of Procedure: 10/28/19  Preoperative Diagnosis: Severe 3-vessel Coronary Artery Disease  Postoperative Diagnosis: Same with moderate aortic valve insufficiency  Procedure:   Aortic valve replacement (23 mm Edwards Intuity bovine bioprosthetic) Coronary Artery Bypass Grafting x 3  Reverse saphenous vein graft to Distal Left Anterior Descending Coronary Artery; reverse saphenous Vein Graft to Posterior Descending Coronary Artery and distal obtuse Marginal Branch of Left Circumflex Coronary Artery; Endoscopic Vein Harvest from right thigh and Lower Leg  Surgeon: B.  Murvin Natal, MD  Assistant: Macarthur Critchley PA-C  Anesthesia: General  Operative Findings:  Mildly reduced left ventricular systolic function  Good quality saphenous vein conduit  Air quality target vessels for grafting  Well-seated aortic valve prosthesis    BRIEF CLINICAL NOTE AND INDICATIONS FOR SURGERY  64 year old gentleman with longstanding coronary artery disease status post LIMA to LAD in 1997 and a history of end-stage renal disease presents with N STEMI.  Repeat cath demonstrates nearly occlusive disease of a right coronary artery stent and an occluded LIMA to LAD graft.  Further work-up as shown moderate aortic valve insufficiency.  He is taken to the operating room for redo sternotomy and redo CABG with possible aortic valve replacement.   DETAILS OF THE OPERATIVE PROCEDURE  Preparation:  The patient is brought to the operating room on the above mentioned date.  Attempts at access of the head and neck venous anatomy are performed but cannot be fully established due to the patient's existing disease due to dialysis.  Therefore, venous and arterial access are placed in the groins including a left femoral artery balloon pump.  Once this is secured general endotracheal anesthesia is induced uneventfully. A Foley catheter is placed.  Preoperative antibiotics are  delivered.  Baseline transesophageal echocardiogram was performed.  Findings were notable for mildly reduced left ventricular function and  moderate aortic valve disease.  The patient's chest, abdomen, both groins, and both lower extremities are prepared and draped in a sterile manner. A time out procedure is performed.   Surgical Approach and Conduit Harvest:  A redo median sternotomy incision was performed and dissection was carried down to the level of the sternum with electrocautery.  Pre-existing sternal wires were cut and removed.  The sternum was divided in the midline with an oscillating saw.  The chest was entered safely without injury to underlying structures.  The posterior table on each side of the sternum was dissected free of adhesions.  Simultaneously, the greater saphenous vein is obtained from the patient's right thigh using endoscopic vein harvest technique. The saphenous vein is notably good quality conduit. After removal of the saphenous vein, the small surgical incisions in the lower extremity are closed with absorbable suture.    Extracorporeal Cardiopulmonary Bypass and Myocardial Protection:  Intrapericardial dissection is performed to isolate the right atrial structures and the ascending aorta.  Full dose heparin is given.  The ascending aorta and right atrium are cannulated in a standard fashion.   Adequate heparinization is verified.   A retrograde cardioplegia cannula is placed through the right atrium into the coronary sinus.   Cardiopulmonary bypass was begun and additional intrapericardial dissection is performed however the heart is particularly stuck on the lateral aspect of the left ventricle.  Therefore the decision is made to cross clamp the aorta and the rest of the arch so that safe further intrapericardial dissection could be performed.  A left ventricular vent was inserted through the right superior pulmonary vein  The  patient is cooled to 32C systemic  temperature.  The aortic cross clamp is applied and cold blood cardioplegia is delivered initially in a retro-grade fashion through the coronary sinus catheter since there was moderate aortic valve insufficiency.  The ascending aorta was then opened to allow direct coronary injection of additional cardioplegia.  Iced saline slush is applied for topical hypothermia.   Repeat doses of cardioplegia are administered intermittently throughout the entire cross clamp portion of the operation through the aortic root,  through the coronary sinus catheter, and through subsequently placed vein grafts in order to maintain completely flat electrocardiogram.  Once the heart is arrested, the remainder of the intrapericardial dissection could be performed safely.  This allowed demonstration of the coronary vessel targets at the level of the LAD distal obtuse marginal from the circumflex vessel and the right coronary artery branching.   Coronary Artery Bypass Grafting:   The distal left anterior coronary artery was grafted with a reversed saphenous vein graft segment a in an end-to-side fashion.  At the site of distal anastomosis the target vessel was good quality and measured approximately 1.5 mm in diameter.  The distal obtuse marginal branch of the left circumflex coronary artery was grafted using a reversed saphenous vein graft in an end-to-side fashion.  At the site of distal anastomosis the target vessel was good quality and measured approximately 1.5 mm in diameter.  Next, the posterior descending branch of the right coronary artery was grafted using the same reversed saphenous vein graft in a side-to-side fashion to create a sequenced graft configuration.  At the site of distal anastomosis the target vessel was good quality and measured approximately 1.5 mm in diameter.  Aortic valve replacement: Having already opened the ascending aorta, the aortic valve is inspected.  The noncoronary leaflet was heavily  calcified and the calcium extended onto the anterior leaflet of the mitral valve.  The leaflets were excised sharply.  The annulus was debrided with rongeur forceps.  Intuity valve sizers were then brought onto the field after washing the valve and ventricle.  23 mm sizer fit well.  Nadir sutures were placed at the base of each corresponding cusp.  These were then brought through the sewing cuff at the appropriate position of the Intuity valve and the valve was lowered into position.  The sutures were then tied with core knot crimping devices.  The valve was then inflated using the manufacturer recommendations for balloon inflation of the subvalvular skirt.  The valve seated well.  The aortotomy was closed in layers.  All proximal vein graft anastomoses were placed directly to the ascending aorta prior to removal of the aortic cross clamp.  Hotshot dose of cardioplegia was delivered antegrade and retrograde.   Procedure Completion:  All proximal and distal coronary anastomoses were inspected for hemostasis and appropriate graft orientation. Epicardial pacing wires are fixed to the right ventricular outflow tract and to the right atrial appendage. The patient is rewarmed to 37C temperature. The patient is weaned and disconnected from cardiopulmonary bypass.  The patient's rhythm at separation from bypass was sinus bradycardia.  The patient was weaned from cardiopulmonary bypass  With moderate inotropic support.   Followup transesophageal echocardiogram performed after separation from bypass revealed improved heart function compared with the preoperative exam.  The aortic and venous cannula were removed uneventfully. Protamine was administered to reverse the anticoagulation. The mediastinum and pleural space were inspected for hemostasis and irrigated with saline solution. The mediastinum and both pleural spaces were drained  using fluted chest tubes placed through separate stab incisions inferiorly.  The  soft tissues anterior to the aorta were reapproximated loosely. The sternum is closed with double strength sternal wire. The soft tissues anterior to the sternum were closed in multiple layers and the skin is closed with a running subcuticular skin closure.    Disposition:  The patient tolerated the procedure well and is transported to the surgical intensive care in critical condition. There are no intraoperative complications. All sponge instrument and needle counts are verified correct at completion of the operation.    Jayme Cloud, MD 10/29/2019 10:20 AM

## 2019-10-29 NOTE — Progress Notes (Signed)
ANTICOAGULATION CONSULT NOTE - Follow Up  Consult  Pharmacy Consult for argatroban Indication: R/o HIT / IABP   Allergies  Allergen Reactions  . Baclofen Other (See Comments)    Possibly stroke like symptoms  . Iodinated Diagnostic Agents Swelling, Rash and Other (See Comments)    Other Reaction: burning to mouth, swelling of lips  . Lipitor [Atorvastatin] Other (See Comments)    Leg pain  . Metoprolol Other (See Comments)    Headaches     Patient Measurements: Height: 5' 6.5" (168.9 cm) Weight: 118.6 kg (261 lb 7.5 oz) IBW/kg (Calculated) : 64.95  Vital Signs: Temp: 97.7 F (36.5 C) (04/03 1200) Temp Source: Oral (04/03 1200) BP: 146/37 (04/03 1533) Pulse Rate: 94 (04/03 1533)  Labs: Recent Labs    10/27/19 0451 10/27/19 0927 10/27/19 1827 10/27/19 2011 10/28/19 0315 10/28/19 6010 10/28/19 1015 10/28/19 1611 10/28/19 1611 10/28/19 1825 10/29/19 0108 10/29/19 0410 10/29/19 0929 10/29/19 1355  HGB 12.5*   < >  --    < > 12.8*   < >  --  12.2*   < > 11.6*  --  11.6*  --   --   HCT 38.9*   < >  --    < > 37.5*   < >  --  35.2*  --  34.0*  --  34.8*  --   --   PLT 160   < >  --    < > 48*  --   --  29*  --   --   --  28*  --   --   APTT >200*  --  >200*   < > 32  --   --   --   --   --    < > 74* 68* 61*  LABPROT 17.2*  --  25.0*  --  17.0*  --   --   --   --   --   --   --   --   --   INR 1.4*  --  2.3*  --  1.4*  --   --   --   --   --   --   --   --   --   HEPARINUNFRC >2.20*  --   --   --   --   --   --   --   --   --   --   --   --   --   CREATININE 8.72*   < >  --   --  7.21*   < > 6.49* 5.73*  --   --   --  4.48*  --   --    < > = values in this interval not displayed.    Estimated Creatinine Clearance: 20.1 mL/min (A) (by C-G formula based on SCr of 4.48 mg/dL (H)).   Medical History: Past Medical History:  Diagnosis Date  . Arthritis   . Atrial flutter ([place])    Ablation 2018  . 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 Erectile dysfunction   . ESRD (end stage renal disease) on dialysis ([place])    Pt on HD 2003 >> transplanted in 2009, back on HD in 2016. [name] TTS.   . Essential hypertension   . Gout   .  Hernia of abdominal wall   . History of blood transfusion   . History of cardiomyopathy   . History of kidney stones   . History of pneumonia   . Migraine   . Myocardial infarction ([place])    1996  . Pneumonia   . Secondary hyperparathyroidism ([place])   . Type 2 diabetes mellitus ([place])   . Wears glasses     Medications:  Medications Prior to Admission  Medication Sig Dispense Refill Last Dose  . acetaminophen (TYLENOL) 500 MG tablet Take 1,000 mg by mouth every 6 (six) hours as needed for moderate pain or headache.   Past Week at Unknown time  . albuterol (PROVENTIL HFA;VENTOLIN HFA) 108 (90 Base) MCG/ACT inhaler Inhale 1-2 puffs into the lungs every 6 (six) hours as needed for wheezing or shortness of breath. 1 Inhaler 0 unk  . aspirin EC 81 MG tablet Take 81 mg by mouth daily.    10/24/2019 at Unknown time  . calcium acetate (PHOSLO) 667 MG capsule Take 1,334-2,001 mg by mouth See admin instructions. 3 capsules with dinner and 2 capsules with snacks   Past Week at Unknown time  . calcium carbonate (OS-CAL - DOSED IN MG OF ELEMENTAL CALCIUM) 1250 (500 Ca) MG tablet Take 2 tablets by mouth daily.    Past Month at Unknown time  . clopidogrel (PLAVIX) 75 MG tablet TAKE 1 TABLET(75 MG) BY MOUTH DAILY (Patient taking differently: Take 75 mg by mouth daily. ) 90 tablet 3 Past Week at 2000  . Colchicine 0.6 MG CAPS Take 0.6 mg by mouth See admin instructions. Take 0.6 mg when first symptoms of gout flare occur, then take another 0.6 mg 14 days later as needed for gout   unk  . dextromethorphan (DELSYM) 30 MG/5ML liquid Take 30 mg by mouth as needed for cough.   unk   . Dextromethorphan-guaiFENesin 5-100 MG/5ML LIQD Take 15 mLs by mouth 2 (two) times daily as needed (Cough).    unk  . docusate sodium (COLACE) 100 MG capsule Take 100 mg by mouth daily as needed for mild constipation.   unk  . ezetimibe (ZETIA) 10 MG tablet TAKE 1 TABLET(10 MG) BY MOUTH DAILY (Patient taking differently: Take 10 mg by mouth daily. ) 90 tablet 3 Past Week at Unknown time  . isosorbide mononitrate (IMDUR) 30 MG 24 hr tablet Take 1 tablet (30 mg total) by mouth daily. 90 tablet 3 Past Week at Unknown time  . lidocaine-prilocaine (EMLA) cream Apply 1 application topically daily as needed (Numbing).    Past Month at Unknown time  . metoprolol succinate (TOPROL XL) 25 MG 24 hr tablet Take 1 tablet (25 mg total) by mouth daily. 90 tablet 3 Past Week at 2100  . multivitamin (RENA-VIT) TABS tablet Take 1 tablet by mouth daily.    Past Week at Unknown time  . neomycin-bacitracin-polymyxin (NEOSPORIN) ointment Apply 1 application topically 2 (two) times daily. Apply to feet.     . nitroGLYCERIN (NITROSTAT) 0.4 MG SL tablet Place 1 tablet (0.4 mg total) under the tongue every 5 (five) minutes as needed for chest pain. 25 tablet 6 10/24/2019 at Unknown time  . repaglinide (PRANDIN) 0.5 MG tablet Take 1 tablet (0.5 mg total) by mouth 2 (two) times daily before a meal. 60 tablet 4 Past Week at Unknown time  . rosuvastatin (CRESTOR) 10 MG tablet Take 1 tablet (10 mg total) by mouth daily. 90 tablet 3 Past Week at Unknown time  . silver sulfADIAZINE (  SILVADENE) 1 % cream Apply 1 application topically daily. (Patient taking differently: Apply 1 application topically 2 (two) times daily. ) 50 g 0 Past Week at Unknown time  . traMADol (ULTRAM) 50 MG tablet Take 50 mg by mouth 2 (two) times daily as needed for moderate pain.    Past Week at Unknown time  . doxycycline (VIBRA-TABS) 100 MG tablet Take 1 tablet (100 mg total) by mouth 2 (two) times daily. (Patient not taking: Reported on 10/24/2019) 20 tablet  0 Completed Course at Unknown time  . Methoxy PEG-Epoetin Beta (MIRCERA IJ) Mircera     . oxyCODONE-acetaminophen (PERCOCET/ROXICET) 5-325 MG tablet Take 1 tablet by mouth every 6 (six) hours as needed. (Patient not taking: Reported on 10/24/2019) 12 tablet 0 Not Taking at Unknown time  . pantoprazole (PROTONIX) 40 MG tablet Take 1 tablet (40 mg total) by mouth daily. (Patient not taking: Reported on 10/24/2019) 90 tablet 3 Not Taking at Unknown time    Assessment: 14 YOM s/p redo CABG and AVR now with dropping platelets concerning for HIT. 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 H/H stable s/p transfusion. Plt down to 28k. Only slight bleeding from CT drainage Argatroban 0.4 mcg/kg/min aptt 68sec slightly elevated above planned goal, recheck aPTT now within therapeutic range at 61 seconds on 0.3 mcg/kg/min.  Goal of Therapy:  aPTT 50-65 seconds Monitor platelets by anticoagulation protocol: Yes   Plan:  -Continue argatroban 0.3 mcg/kg/min -Recheck aPTT with morning labs -F/u HIT panel results    Arrie Senate, PharmD, BCPS Clinical Pharmacist [phone] Please check AMION for all [place] numbers 10/29/2019

## 2019-10-29 NOTE — Progress Notes (Signed)
ANTICOAGULATION CONSULT NOTE - Follow Up  Consult  Pharmacy Consult for argatroban Indication: R/o HIT / IABP   Allergies  Allergen Reactions  . Baclofen Other (See Comments)    Possibly stroke like symptoms  . Iodinated Diagnostic Agents Swelling, Rash and Other (See Comments)    Other Reaction: burning to mouth, swelling of lips  . Lipitor [Atorvastatin] Other (See Comments)    Leg pain  . Metoprolol Other (See Comments)    Headaches     Patient Measurements: Height: 5' 6.5" (168.9 cm) Weight: 118.6 kg (261 lb 7.5 oz) IBW/kg (Calculated) : 64.95  Vital Signs: Temp: 97.7 F (36.5 C) (04/03 0830) Temp Source: Oral (04/03 0830) BP: 143/41 (04/03 0752) Pulse Rate: 47 (04/03 1015)  Labs: Recent Labs    10/27/19 0451 10/27/19 0927 10/27/19 1827 10/27/19 2011 10/28/19 0315 10/28/19 0315 10/28/19 1610 10/28/19 1015 10/28/19 1611 10/28/19 1611 10/28/19 1825 10/29/19 0108 10/29/19 0410 10/29/19 0929  HGB 12.5*   < >  --    < > 12.8*  --    < >  --  12.2*   < > 11.6*  --  11.6*  --   HCT 38.9*   < >  --    < > 37.5*  --    < >  --  35.2*  --  34.0*  --  34.8*  --   PLT 160   < >  --    < > 48*  --   --   --  29*  --   --   --  28*  --   APTT >200*  --  >200*   < > 32   < >  --   --   --   --   --  64* 74* 68*  LABPROT 17.2*  --  25.0*  --  17.0*  --   --   --   --   --   --   --   --   --   INR 1.4*  --  2.3*  --  1.4*  --   --   --   --   --   --   --   --   --   HEPARINUNFRC >2.20*  --   --   --   --   --   --   --   --   --   --   --   --   --   CREATININE 8.72*   < >  --   --  7.21*  --    < > 6.49* 5.73*  --   --   --  4.48*  --    < > = values in this interval not displayed.    Estimated Creatinine Clearance: 20.1 mL/min (A) (by C-G formula based on SCr of 4.48 mg/dL (H)).   Medical History: Past Medical History:  Diagnosis Date  . Arthritis   . Atrial flutter ([place])    Ablation 2018  . 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 Erectile dysfunction   . ESRD (end stage renal disease) on dialysis ([place])    Pt on HD 2003 >> transplanted in 2009, back on HD in 2016. [name] TTS.   . Essential hypertension   . Gout   .  Hernia of abdominal wall   . History of blood transfusion   . History of cardiomyopathy   . History of kidney stones   . History of pneumonia   . Migraine   . Myocardial infarction ([place])    1996  . Pneumonia   . Secondary hyperparathyroidism ([place])   . Type 2 diabetes mellitus ([place])   . Wears glasses     Medications:  Medications Prior to Admission  Medication Sig Dispense Refill Last Dose  . acetaminophen (TYLENOL) 500 MG tablet Take 1,000 mg by mouth every 6 (six) hours as needed for moderate pain or headache.   Past Week at Unknown time  . albuterol (PROVENTIL HFA;VENTOLIN HFA) 108 (90 Base) MCG/ACT inhaler Inhale 1-2 puffs into the lungs every 6 (six) hours as needed for wheezing or shortness of breath. 1 Inhaler 0 unk  . aspirin EC 81 MG tablet Take 81 mg by mouth daily.    10/24/2019 at Unknown time  . calcium acetate (PHOSLO) 667 MG capsule Take 1,334-2,001 mg by mouth See admin instructions. 3 capsules with dinner and 2 capsules with snacks   Past Week at Unknown time  . calcium carbonate (OS-CAL - DOSED IN MG OF ELEMENTAL CALCIUM) 1250 (500 Ca) MG tablet Take 2 tablets by mouth daily.    Past Month at Unknown time  . clopidogrel (PLAVIX) 75 MG tablet TAKE 1 TABLET(75 MG) BY MOUTH DAILY (Patient taking differently: Take 75 mg by mouth daily. ) 90 tablet 3 Past Week at 2000  . Colchicine 0.6 MG CAPS Take 0.6 mg by mouth See admin instructions. Take 0.6 mg when first symptoms of gout flare occur, then take another 0.6 mg 14 days later as needed for gout   unk  . dextromethorphan (DELSYM) 30 MG/5ML liquid Take 30 mg by mouth as needed for cough.   unk   . Dextromethorphan-guaiFENesin 5-100 MG/5ML LIQD Take 15 mLs by mouth 2 (two) times daily as needed (Cough).    unk  . docusate sodium (COLACE) 100 MG capsule Take 100 mg by mouth daily as needed for mild constipation.   unk  . ezetimibe (ZETIA) 10 MG tablet TAKE 1 TABLET(10 MG) BY MOUTH DAILY (Patient taking differently: Take 10 mg by mouth daily. ) 90 tablet 3 Past Week at Unknown time  . isosorbide mononitrate (IMDUR) 30 MG 24 hr tablet Take 1 tablet (30 mg total) by mouth daily. 90 tablet 3 Past Week at Unknown time  . lidocaine-prilocaine (EMLA) cream Apply 1 application topically daily as needed (Numbing).    Past Month at Unknown time  . metoprolol succinate (TOPROL XL) 25 MG 24 hr tablet Take 1 tablet (25 mg total) by mouth daily. 90 tablet 3 Past Week at 2100  . multivitamin (RENA-VIT) TABS tablet Take 1 tablet by mouth daily.    Past Week at Unknown time  . neomycin-bacitracin-polymyxin (NEOSPORIN) ointment Apply 1 application topically 2 (two) times daily. Apply to feet.     . nitroGLYCERIN (NITROSTAT) 0.4 MG SL tablet Place 1 tablet (0.4 mg total) under the tongue every 5 (five) minutes as needed for chest pain. 25 tablet 6 10/24/2019 at Unknown time  . repaglinide (PRANDIN) 0.5 MG tablet Take 1 tablet (0.5 mg total) by mouth 2 (two) times daily before a meal. 60 tablet 4 Past Week at Unknown time  . rosuvastatin (CRESTOR) 10 MG tablet Take 1 tablet (10 mg total) by mouth daily. 90 tablet 3 Past Week at Unknown time  . silver sulfADIAZINE (  SILVADENE) 1 % cream Apply 1 application topically daily. (Patient taking differently: Apply 1 application topically 2 (two) times daily. ) 50 g 0 Past Week at Unknown time  . traMADol (ULTRAM) 50 MG tablet Take 50 mg by mouth 2 (two) times daily as needed for moderate pain.    Past Week at Unknown time  . doxycycline (VIBRA-TABS) 100 MG tablet Take 1 tablet (100 mg total) by mouth 2 (two) times daily. (Patient not taking: Reported on 10/24/2019) 20 tablet  0 Completed Course at Unknown time  . Methoxy PEG-Epoetin Beta (MIRCERA IJ) Mircera     . oxyCODONE-acetaminophen (PERCOCET/ROXICET) 5-325 MG tablet Take 1 tablet by mouth every 6 (six) hours as needed. (Patient not taking: Reported on 10/24/2019) 12 tablet 0 Not Taking at Unknown time  . pantoprazole (PROTONIX) 40 MG tablet Take 1 tablet (40 mg total) by mouth daily. (Patient not taking: Reported on 10/24/2019) 90 tablet 3 Not Taking at Unknown time    Assessment: 70 YOM s/p redo CABG and AVR now with dropping platelets concerning for HIT. 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 H/H stable s/p transfusion. Plt down to 28k. Only slight bleeding from CT drainage Argatroban 0.4 mcg/kg/min aptt 68sec slightly elevated above planned goal  Goal of Therapy:  aPTT 50-65 seconds Monitor platelets by anticoagulation protocol: Yes   Plan:  -decrease  argatroban 0.3 mcg/kg/min -F/u  aPTT this afternoon  -Monitor for bleeding  -F/u HIT panel results      Bonnita Nasuti Pharm.D. CPP, BCPS Clinical Pharmacist [phone] 10/29/2019 10:19 AM    please refer to [facility] for unit-specific pharmacist

## 2019-10-29 NOTE — Progress Notes (Signed)
2 Days Post-Op Procedure(s) (LRB): REDO CORONARY ARTERY BYPASS GRAFTING TIMES 3  USING LEFT GREATER SAPHENOUS LEG VEIN HARVESTED ENDOSCOPICALLY(CABG) (N/A) TRANSESOPHAGEAL ECHOCARDIOGRAM (TEE) (N/A) AORTIC VALVE REPLACEMENT (AVR) USING EDWARDS INTUITY 23 MM AORTIC VALVE. (N/A) Endovein Harvest Of Greater Saphenous Vein (Left) Subjective: Nods that he is ready for ETT to be removed  Objective: Vital signs in last 24 hours: Temp:  [94.5 F (34.7 C)-98.6 F (37 C)] 97.9 F (36.6 C) (04/03 0700) Pulse Rate:  [40-97] 93 (04/03 0700) Cardiac Rhythm: Normal sinus rhythm;Atrial paced;Bundle branch block (04/03 0400) Resp:  [0-60] 57 (04/03 0700) BP: (105-145)/(29-41) 114/34 (04/03 0430) SpO2:  [88 %-100 %] 98 % (04/03 0700) Arterial Line BP: (89-163)/(24-152) 120/34 (04/03 0700) FiO2 (%):  [40 %] 40 % (04/03 0430) Weight:  [118.6 kg] 118.6 kg (04/03 0500)  Hemodynamic parameters for last 24 hours:    Intake/Output from previous day: 04/02 0701 - 04/03 0700 In: 3577.6 [I.V.:2516.9; NG/GT:561.3; IV Piggyback:499.3] Out: 3289 [Chest Tube:620] Intake/Output this shift: No intake/output data recorded.  General appearance: alert and cooperative Neurologic: intact Heart: regular rate and rhythm, S1, S2 normal, no murmur, click, rub or gallop Lungs: clear to auscultation bilaterally Abdomen: soft, non-tender; bowel sounds normal; no masses,  no organomegaly Extremities: edema 2+ Wound: dressed, dry  Lab Results: Recent Labs    10/28/19 1611 10/28/19 1611 10/28/19 1825 10/29/19 0410  WBC 15.9*  --   --  14.9*  HGB 12.2*   < > 11.6* 11.6*  HCT 35.2*   < > 34.0* 34.8*  PLT 29*  --   --  28*   < > = values in this interval not displayed.   BMET:  Recent Labs    10/28/19 1611 10/28/19 1611 10/28/19 1825 10/29/19 0410  NA 143   < > 142 139  K 4.4   < > 4.6 4.8  CL 106  --   --  104  CO2 20*  --   --  22  GLUCOSE 126*  --   --  149*  BUN 31*  --   --  27*  CREATININE 5.73*   --   --  4.48*  CALCIUM 7.0*  --   --  7.2*   < > = values in this interval not displayed.    PT/INR:  Recent Labs    10/28/19 0315  LABPROT 17.0*  INR 1.4*   ABG    Component Value Date/Time   PHART 7.306 (L) 10/28/2019 1825   HCO3 22.6 10/28/2019 1825   TCO2 24 10/28/2019 1825   ACIDBASEDEF 4.0 (H) 10/28/2019 1825   O2SAT 94.0 10/28/2019 1825   CBG (last 3)  Recent Labs    10/29/19 0210 10/29/19 0415 10/29/19 0619  GLUCAP 118* 131* 119*    Assessment/Plan: S/P Procedure(s) (LRB): REDO CORONARY ARTERY BYPASS GRAFTING TIMES 3  USING LEFT GREATER SAPHENOUS LEG VEIN HARVESTED ENDOSCOPICALLY(CABG) (N/A) TRANSESOPHAGEAL ECHOCARDIOGRAM (TEE) (N/A) AORTIC VALVE REPLACEMENT (AVR) USING EDWARDS INTUITY 23 MM AORTIC VALVE. (N/A) Endovein Harvest Of Greater Saphenous Vein (Left) Mobilize Diuresis try to extubate today  Long-term IV access will be needed, but will also be a challenge given his venous anatomy.    LOS: 5 days    Wonda Olds 10/29/2019

## 2019-10-29 NOTE — Progress Notes (Signed)
      [address]       [place] [address]             [phone]      Intubated  BP (!) 146/37   Pulse (!) 47   Temp 97.7 F (36.5 C) (Oral)   Resp (!) 24   Ht 5' 6.5" (1.689 m)   Wt 118.6 kg   SpO2 99%   BMI 41.57 kg/m  Pulse actually 94- not 47  On IABP 1:2, epi down to 2 levophed down to 16   Intake/Output Summary (Last 24 hours) at 10/29/2019 1741 Last data filed at 10/29/2019 1709 Gross per 24 hour  Intake 3945.48 ml  Output 4830 ml  Net -884.52 ml   On CVVH  Plan  continue with CVVHD Argatroban for possible HIT,  Attempt to extubated tomorrow AM  Revonda Standard. Roxan Hockey, MD [facility] [phone]

## 2019-10-29 NOTE — Progress Notes (Signed)
[facility] Progress Note  Subjective: I/O even yest 3L+ and 3L neg. Labs good. Pulling -50 cc/hr w/o ^ in pressors. Pressors down a bit.   Vitals:   10/29/19 1145 10/29/19 1200 10/29/19 1215 10/29/19 1230  BP:      Pulse: (!) 47 (!) 47 (!) 47 (!) 47  Resp: (!) 35 (!) 24 (!) 30 19  Temp:  97.7 F (36.5 C)    TempSrc:  Oral    SpO2: 96% 93% 95% 95%  Weight:      Height:        Exam:  gen on vent, responsive, intubated  facial edema   Chest - coarse BS    Cor no Rub or gallop    Abd soft obese    Ext 2+ edema UE's > LE's    Neuro on sedation    L IJ TDC , LUA AVG not in use/ clotted    Dialysis: [name] MWF   4h  99.5  2K/3.5Ca  P4  TDC   Hep 3000  - mircera 75 last 3/22  - hect 1   Assessment/ Plan: 1. CP/ ACS/ prior CABG '97 - sp CABG on 4/1 2. Cardiogenic shock - IABP and pressors, per CT surg 3. ESRD - usual HD MWF, on CRRT now, will continue. Using all 4/2.5 4. Volume - vol excess, ^ UF to -75 cc/hr as tol 5. Resp failure - on vent 6. Anemia abl/ ckd - tsat 16%, ferr 1293.  Last esa was OP on 3/22, next dose due around 4/5.  Hb > 11, no esa for now.  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   Inpatient medications: . acetaminophen  1,000 mg Oral Q6H   Or  . acetaminophen (TYLENOL) oral liquid 160 mg/5 mL  1,000 mg Per Tube Q6H  . aspirin EC  325 mg Oral Daily   Or  . aspirin  324 mg Per Tube Daily  . bisacodyl  10 mg Oral Daily   Or  . bisacodyl  10 mg Rectal Daily  . chlorhexidine gluconate (MEDLINE KIT)  15 mL Mouth Rinse BID  . Chlorhexidine Gluconate Cloth  6 each Topical Daily  . docusate  200 mg  Per Tube Daily  . feeding supplement (PRO-STAT SUGAR FREE 64)  30 mL Per Tube TID  . levalbuterol  0.63 mg Nebulization Q6H  . mouth rinse  15 mL Mouth Rinse 10 times per day  . metoprolol tartrate  12.5 mg Oral BID   Or  . metoprolol tartrate  12.5 mg Per Tube BID  . multivitamin  1 tablet Per Tube QHS  . pantoprazole sodium  40 mg Per Tube Daily  . rosuvastatin  10 mg Per Tube q1800  . sodium chloride flush  10-40 mL Intracatheter Q12H  . sodium chloride flush  3 mL Intravenous Q12H  . sodium chloride flush  3 mL Intravenous Q12H   .  prismasol BGK 4/2.5 400 mL/hr at 10/29/19 0113  .  prismasol BGK 4/2.5 300 mL/hr at 10/29/19 0000  . sodium chloride 10 mL/hr at 10/29/19 1200  . sodium chloride    . sodium chloride Stopped (10/28/19 0326)  . sodium chloride    . argatroban 0.3 mcg/kg/min (10/29/19 1200)  . cefUROXime (ZINACEF)  IV Stopped (10/29/19 4643)  . dexmedetomidine (PRECEDEX) IV infusion Stopped (10/29/19 0545)  . epinephrine 5 mcg/min (10/29/19 1200)  . feeding supplement (VITAL AF 1.2 CAL) 1,000 mL (10/29/19 1200)  . fentaNYL infusion INTRAVENOUS Stopped (10/29/19 0659)  . insulin 3.8 mL/hr at 10/29/19 1200  . lactated ringers 10 mL/hr at 10/29/19 1200  . nitroGLYCERIN Stopped (10/27/19 1722)  . norepinephrine (LEVOPHED) Adult infusion 20 mcg/min (10/29/19 1224)  . phenylephrine (NEO-SYNEPHRINE) Adult infusion    . prismasol BGK 4/2.5 1,800 mL/hr at 10/29/19 1129  . vasopressin (PITRESSIN) infusion - *FOR SHOCK* 0.04 Units/min (10/29/19 1200)   sodium chloride, sodium chloride, dextrose, fentaNYL, heparin, metoprolol tartrate, midazolam, morphine injection, ondansetron (ZOFRAN) IV, oxyCODONE, sodium chloride flush, sodium chloride flush, sodium chloride flush, traMADol

## 2019-10-29 NOTE — Progress Notes (Signed)
Dr. Orvan Seen updated to include vascular assessment

## 2019-10-29 NOTE — Progress Notes (Addendum)
[place] for argatroban Indication: R/o HIT  Allergies  Allergen Reactions  . Baclofen Other (See Comments)    Possibly stroke like symptoms  . Iodinated Diagnostic Agents Swelling, Rash and Other (See Comments)    Other Reaction: burning to mouth, swelling of lips  . Lipitor [Atorvastatin] Other (See Comments)    Leg pain  . Metoprolol Other (See Comments)    Headaches     Patient Measurements: Height: 5' 6.5" (168.9 cm) Weight: 117.7 kg (259 lb 7.7 oz) IBW/kg (Calculated) : 64.95  Heparin dosing weight: 92.1 kg  Vital Signs: Temp: 97.2 F (36.2 C) (04/03 0215) Temp Source: Esophageal (04/03 0000) BP: 105/41 (04/03 0159) Pulse Rate: 89 (04/03 0215)  Labs: Recent Labs    10/26/19 0802 10/26/19 0802 10/27/19 0451 10/27/19 0451 10/27/19 2993 10/27/19 1731 10/27/19 1827 10/27/19 2011 10/28/19 0315 10/28/19 0315 10/28/19 7169 10/28/19 6789 10/28/19 1015 10/28/19 1611 10/28/19 1825 10/29/19 0108  HGB 10.8*   < > 12.5*  --    < > 10.4*  --    < > 12.8*   < > 10.9*   < >  --  12.2* 11.6*  --   HCT 33.5*   < > 38.9*  --    < > 31.6*  --    < > 37.5*   < > 32.0*  --   --  35.2* 34.0*  --   PLT 164   < > 160  --    < > 93*  --   --  48*  --   --   --   --  29*  --   --   APTT  --   --  >200*   < >  --   --  >200*  --  32  --   --   --   --   --   --  64*  LABPROT  --   --  17.2*  --   --   --  25.0*  --  17.0*  --   --   --   --   --   --   --   INR  --   --  1.4*  --   --   --  2.3*  --  1.4*  --   --   --   --   --   --   --   HEPARINUNFRC 0.34  --  >2.20*  --   --   --   --   --   --   --   --   --   --   --   --   --   CREATININE 12.80*   < > 8.72*   < >   < >  --   --   --  7.21*  --   --   --  6.49* 5.73*  --   --    < > = values in this interval not displayed.    Estimated Creatinine Clearance: 15.7 mL/min (A) (by C-G formula based on SCr of 5.73 mg/dL (H)).   Medical History: Past Medical History:  Diagnosis Date  .  Arthritis   . Atrial flutter ([place])    Ablation 2018  . 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  Erectile dysfunction   . ESRD (end stage renal disease) on dialysis ([place])    Pt on HD 2003 >> transplanted in 2009, back on HD in 2016. [name].   . Essential hypertension   . Gout   . Hernia of abdominal wall   . History of blood transfusion   . History of cardiomyopathy   . History of kidney stones   . History of pneumonia   . Migraine   . Myocardial infarction ([place])    1996  . Pneumonia   . Secondary hyperparathyroidism ([place])   . Type 2 diabetes mellitus ([place])   . Wears glasses     Medications:  Medications Prior to Admission  Medication Sig Dispense Refill Last Dose  . acetaminophen (TYLENOL) 500 MG tablet Take 1,000 mg by mouth every 6 (six) hours as needed for moderate pain or headache.   Past Week at Unknown time  . albuterol (PROVENTIL HFA;VENTOLIN HFA) 108 (90 Base) MCG/ACT inhaler Inhale 1-2 puffs into the lungs every 6 (six) hours as needed for wheezing or shortness of breath. 1 Inhaler 0 unk  . aspirin EC 81 MG tablet Take 81 mg by mouth daily.    10/24/2019 at Unknown time  . calcium acetate (PHOSLO) 667 MG capsule Take 1,334-2,001 mg by mouth See admin instructions. 3 capsules with dinner and 2 capsules with snacks   Past Week at Unknown time  . calcium carbonate (OS-CAL - DOSED IN MG OF ELEMENTAL CALCIUM) 1250 (500 Ca) MG tablet Take 2 tablets by mouth daily.    Past Month at Unknown time  . clopidogrel (PLAVIX) 75 MG tablet TAKE 1 TABLET(75 MG) BY MOUTH DAILY (Patient taking differently: Take 75 mg by mouth daily. ) 90 tablet 3 Past Week at 2000  . Colchicine 0.6 MG CAPS Take 0.6 mg by mouth See admin instructions. Take 0.6 mg when first symptoms of gout flare occur, then take another 0.6 mg 14 days  later as needed for gout   unk  . dextromethorphan (DELSYM) 30 MG/5ML liquid Take 30 mg by mouth as needed for cough.   unk  . Dextromethorphan-guaiFENesin 5-100 MG/5ML LIQD Take 15 mLs by mouth 2 (two) times daily as needed (Cough).    unk  . docusate sodium (COLACE) 100 MG capsule Take 100 mg by mouth daily as needed for mild constipation.   unk  . ezetimibe (ZETIA) 10 MG tablet TAKE 1 TABLET(10 MG) BY MOUTH DAILY (Patient taking differently: Take 10 mg by mouth daily. ) 90 tablet 3 Past Week at Unknown time  . isosorbide mononitrate (IMDUR) 30 MG 24 hr tablet Take 1 tablet (30 mg total) by mouth daily. 90 tablet 3 Past Week at Unknown time  . lidocaine-prilocaine (EMLA) cream Apply 1 application topically daily as needed (Numbing).    Past Month at Unknown time  . metoprolol succinate (TOPROL XL) 25 MG 24 hr tablet Take 1 tablet (25 mg total) by mouth daily. 90 tablet 3 Past Week at 2100  . multivitamin (RENA-VIT) TABS tablet Take 1 tablet by mouth daily.    Past Week at Unknown time  . neomycin-bacitracin-polymyxin (NEOSPORIN) ointment Apply 1 application topically 2 (two) times daily. Apply to feet.     . nitroGLYCERIN (NITROSTAT) 0.4 MG SL tablet Place 1 tablet (0.4 mg total) under the tongue every 5 (five) minutes as needed for chest pain. 25 tablet 6 10/24/2019 at Unknown time  . repaglinide (PRANDIN) 0.5 MG tablet Take 1 tablet (0.5 mg total) by  mouth 2 (two) times daily before a meal. 60 tablet 4 Past Week at Unknown time  . rosuvastatin (CRESTOR) 10 MG tablet Take 1 tablet (10 mg total) by mouth daily. 90 tablet 3 Past Week at Unknown time  . silver sulfADIAZINE (SILVADENE) 1 % cream Apply 1 application topically daily. (Patient taking differently: Apply 1 application topically 2 (two) times daily. ) 50 g 0 Past Week at Unknown time  . traMADol (ULTRAM) 50 MG tablet Take 50 mg by mouth 2 (two) times daily as needed for moderate pain.    Past Week at Unknown time  . doxycycline (VIBRA-TABS)  100 MG tablet Take 1 tablet (100 mg total) by mouth 2 (two) times daily. (Patient not taking: Reported on 10/24/2019) 20 tablet 0 Completed Course at Unknown time  . Methoxy PEG-Epoetin Beta (MIRCERA IJ) Mircera     . oxyCODONE-acetaminophen (PERCOCET/ROXICET) 5-325 MG tablet Take 1 tablet by mouth every 6 (six) hours as needed. (Patient not taking: Reported on 10/24/2019) 12 tablet 0 Not Taking at Unknown time  . pantoprazole (PROTONIX) 40 MG tablet Take 1 tablet (40 mg total) by mouth daily. 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 Pharmacy consulted to start IV argatroban. Heparin IV d/c'd. Weaning IABP to off. H/H improved, stable s/p transfusion. Plt down to 29k.  Initial aPTT therapeutic at 64. No active bleed issues reported.  Goal of Therapy:  aPTT 50-65 seconds per Dr. Orvan Seen Monitor platelets by anticoagulation protocol: Yes   Plan:  Continue argatroban at 0.5 mcg/kg/min Check 4hr aPTT confirm Monitor daily aPTT and CBC, s/sx bleeding F/u HIT panel results    Arturo Morton, PharmD, BCPS Please check AMION for all [place] contact numbers Clinical Pharmacist 10/29/2019 2:26 AM   ADDENDUM:  F/u APTT now slightly high at 74 for lower goal (50-65). No bleeding or issues with infusion per discussion with RN.  Plan: Decrease argatroban to 0.4 mcg/kg/min Check 2-hr aPTT Monitor daily aPTT and CBC, s/sx bleeding F/u HIT panel results   Arturo Morton, PharmD, BCPS Please check AMION for all [place] contact numbers Clinical Pharmacist 10/29/2019 5:18 AM

## 2019-10-29 NOTE — Plan of Care (Signed)

## 2019-10-29 NOTE — Progress Notes (Signed)
Unable to doppler pulses on left foot; foot cool, good cap refill, movement, and sensation; second RN attempted unsuccessfully; heat packs/warm blanket applied, will reassess

## 2019-10-29 NOTE — Progress Notes (Signed)
NAME:  Patrick [name], MRN:  [id number], DOB:  November 15, 1955, LOS: 5 ADMISSION DATE:  10/24/2019, CONSULTATION DATE:  10/28/19 REFERRING MD:  Orvan Seen, CHIEF COMPLAINT:  NSTEMI s/p CABG  Brief History   Patrick [name] is a 66 yom with PMH of ESRD on HD s/p renal transplant, CAD s/p CABG (1997) DES (2020) CABG (10/27/19), HFpEF, T2DM, PAF who presented to [facility] on 10/24/19 for progressive chest pain and SOB and was found to have an NSTEMI and multivessel CAD on heart cath. Pre-op workup additionally revealed mod-severe AV insufficiency. CT surgery was consulted and he underwent CABG on 4/1 with AVR and IABP. PCCM asked to consult for vent management    Past Medical History  ESRD on HD s/p renal transplant,  CAD s/p CABG (1997) DES (2020) CABG (10/27/19) HFpEF PAF T2DM  Significant Hospital Events   3/29>admission 3/30>heart cath>multivessel disease 4/1>CABG with IABP placement, AVR. 4U PRBC, 2U FFP transfused 4/3>IABP 1:2. Stable respiratory status on CPAP setting.  Consults:  PCCM  Significant Diagnostic Tests:  3/30 CT chest>>minimal LL atelectasis 3/30 LHC>>ost 1st diagonal lesion 50% stenosed, prox LAD 100% stenosed, Mid RCA 100% stenosed, ost cx to prox cx lesion 90% stenosed, mild elevation in LVEDP 3/31 echo>>LVEF 45-50%; LV global hypokinesis; normal RV systolic pressure and size, normal PA pressure, moderate LA dilation, moderate AV regurg 4/1 CXR>>stable cardiomegaly; mild pulm edema; trace b/l pleural effusions with bibasilar atelectasis 4/2 CXR>>diffuse mild b/l pulm infiltrates/edema 4/3 CXR>>b/l pulm edema Micro Data:  MRSA neg  Antimicrobials:  Cefuroxime 4/1>  Interim history/subjective:  Able to take 540mL off with CRRT overnight. IABP decreased to 1:2. On CPAP vent setting.  Objective   Blood pressure (!) 114/34, pulse 93, temperature 97.9 F (36.6 C), temperature source Oral, resp. rate (!) 57, height 5' 6.5" (1.689 m), weight 118.6 kg, SpO2 98 %.    Vent Mode:  SIMV;PRVC;PSV FiO2 (%):  [40 %] 40 % Set Rate:  [18 bmp] 18 bmp Vt Set:  [510 mL] 510 mL PEEP:  [8 cmH20] 8 cmH20 Pressure Support:  [10 cmH20] 10 cmH20 Plateau Pressure:  [28 cmH20-30 cmH20] 30 cmH20   Intake/Output Summary (Last 24 hours) at 10/29/2019 0716 Last data filed at 10/29/2019 0700 Gross per 24 hour  Intake 3577.59 ml  Output 3289 ml  Net 288.59 ml   Filed Weights   10/27/19 0300 10/28/19 0500 10/29/19 0500  Weight: 96.7 kg 117.7 kg 118.6 kg    Examination: General: intubated HENT: periorbital edema improved since yesterday. ETT in place Lungs: lung sounds distant. Breathing comfortably on CPAP vent setting Cardiovascular: RRR. Intact dressing to midline sterotomy incision and IABP. Chest tubes present. Minimal extremity edema. Bear hugger in place Abdomen: bs active Extremities: warm with bear hugger Neuro: awake. Following commands. GU: foley  Resolved Hospital Problem list   none  Assessment & Plan:   Multivessel CAD s/p repeat CABG 4/1 AV insufficiency s/p AVR 4/1 Combined heart failure. MAPs improved Cardiogenic shock. Milrinone d/c at 1800 last pm. Maintaining pressures with vasopressors and IABP down to 1:2 this morning. Continue epi, levo, vasopressin per CTS.   Post-op respiratory insufficiency.  CT placement 4/1. Continue to monitor Pulm hygeine.  Heading towards being able to extubate as he is tolerating the CPAP well and pulling good volumes however would be somewhat hesitant as he is still significantly hypervolemic and CXR this morning evident of pulm edema. Would ideally like to pull more fluid today to help with chance of successful extubation.  Acute encephalopathy 2/2 post-op crit illness. Sedation off since 0630. Fentanyl prn  Post op blood loss anemia. hgb stable this morning Thrombocytopenia. Platelets remain low but stable from yesterday. HIT panel still in process. Continue argatroban.  Transfuse for hgb <8 and platelet <10.  ESRD on  HD. +6L since admission. +14kg since admission. Able to take 0.5L off with CRRT yesterday. Electrolytes stable. Management per nephrology  T2DM. Glucoses stable. Continue insulin gtt  Best practice:  Diet: tube feeds started 4/2 Pain/Anxiety/Delirium protocol (if indicated): fentanyl prn VAP protocol (if indicated): yes DVT prophylaxis: SCD GI prophylaxis: colace, pepcid, zofran, protonix Glucose control: insulin drip Mobility: BR Code Status: full Family Communication: none Disposition: ICU  Labs   CBC: Recent Labs  Lab 10/24/19 0737 10/25/19 0459 10/27/19 0451 10/27/19 0927 10/27/19 1400 10/27/19 1403 10/27/19 1731 10/27/19 2011 10/28/19 0315 10/28/19 0613 10/28/19 1611 10/28/19 1825 10/29/19 0410  WBC 9.3   < > 11.6*  --   --   --  13.4*  --  16.8*  --  15.9*  --  14.9*  NEUTROABS 7.3  --   --   --   --   --   --   --   --   --   --   --   --   HGB 8.6*   < > 12.5*   < > 8.0*   < > 10.4*   < > 12.8* 10.9* 12.2* 11.6* 11.6*  HCT 27.7*   < > 38.9*   < > 23.4*   < > 31.6*   < > 37.5* 32.0* 35.2* 34.0* 34.8*  MCV 101.1*   < > 99.2  --   --   --  94.0  --  88.4  --  88.0  --  89.5  PLT 150   < > 160  --  46*  --  93*  --  48*  --  29*  --  28*   < > = values in this interval not displayed.    Basic Metabolic Panel: Recent Labs  Lab 10/25/19 0459 10/26/19 0802 10/27/19 0451 10/27/19 8127 10/27/19 1519 10/27/19 1523 10/28/19 0315 10/28/19 0315 10/28/19 5170 10/28/19 1015 10/28/19 1611 10/28/19 1825 10/29/19 0410  NA 138   < > 140   < > 138   < > 144   < > 144 145 143 142 139  K 4.5   < > 4.4   < > 4.7   < > 3.0*   < > 3.2* 3.7 4.4 4.6 4.8  CL 99   < > 98   < > 102  --  106  --   --  109 106  --  104  CO2 21*   < > 22  --   --   --  21*  --   --  19* 20*  --  22  GLUCOSE 177*   < > 133*   < > 131*  --  148*  --   --  130* 126*  --  149*  BUN 34*   < > 46*   < > 52*  --  38*  --   --  35* 31*  --  27*  CREATININE 10.11*   < > 8.72*   < > 7.00*  --  7.21*  --    --  6.49* 5.73*  --  4.48*  CALCIUM 8.6*   < > 8.4*  --   --   --  6.9*  --   --  6.8* 7.0*  --  7.2*  MG  --   --   --   --   --   --  1.9  --   --   --  2.0  --  2.2  PHOS 3.1  --   --   --   --   --   --   --   --   --  4.6  4.5  --  3.7   < > = values in this interval not displayed.   GFR: Estimated Creatinine Clearance: 20.1 mL/min (A) (by C-G formula based on SCr of 4.48 mg/dL (H)). Recent Labs  Lab 10/27/19 1731 10/28/19 0315 10/28/19 1611 10/29/19 0410  WBC 13.4* 16.8* 15.9* 14.9*    Liver Function Tests: Recent Labs  Lab 10/25/19 0459 10/27/19 0451 10/28/19 1611 10/28/19 2114 10/29/19 0410  AST  --  97*  --  74*  --   ALT  --  46*  --  27  --   ALKPHOS  --  77  --  36*  --   BILITOT  --  1.1  --  0.8  --   PROT  --  7.3  --  4.8*  --   ALBUMIN 3.4* 3.6 3.0* 2.9* 3.0*   No results for input(s): LIPASE, AMYLASE in the last 168 hours. No results for input(s): AMMONIA in the last 168 hours.  ABG    Component Value Date/Time   PHART 7.306 (L) 10/28/2019 1825   PCO2ART 45.1 10/28/2019 1825   PO2ART 77.0 (L) 10/28/2019 1825   HCO3 22.6 10/28/2019 1825   TCO2 24 10/28/2019 1825   ACIDBASEDEF 4.0 (H) 10/28/2019 1825   O2SAT 94.0 10/28/2019 1825     Coagulation Profile: Recent Labs  Lab 10/27/19 0451 10/27/19 1827 10/28/19 0315  INR 1.4* 2.3* 1.4*    Cardiac Enzymes: No results for input(s): CKTOTAL, CKMB, CKMBINDEX, TROPONINI in the last 168 hours.  HbA1C: Hemoglobin A1C  Date/Time Value Ref Range Status  06/07/2019 10:54 AM 6.4 (A) 4.0 - 5.6 % Final   Hgb A1c MFr Bld  Date/Time Value Ref Range Status  10/27/2019 04:51 AM 6.2 (H) 4.8 - 5.6 % Final    Comment:    (NOTE) Pre diabetes:          5.7%-6.4% Diabetes:              >6.4% Glycemic control for   <7.0% adults with diabetes   08/20/2018 07:23 AM 6.9 (H) 4.8 - 5.6 % Final    Comment:    (NOTE) Pre diabetes:          5.7%-6.4% Diabetes:              >6.4% Glycemic control for    <7.0% adults with diabetes     CBG: Recent Labs  Lab 10/28/19 2209 10/29/19 0011 10/29/19 0210 10/29/19 0415 10/29/19 0619  GLUCAP 115* 131* 118* 131* 119*    Past Medical History  He,  has a past medical history of Arthritis, Atrial flutter ([place]), Coronary artery disease, Erectile dysfunction, ESRD (end stage renal disease) on dialysis ([place]), Essential hypertension, Gout, Hernia of abdominal wall, History of blood transfusion, History of cardiomyopathy, History of kidney stones, History of pneumonia, Migraine, Myocardial infarction ([place]), Pneumonia, Secondary hyperparathyroidism ([place]), Type 2 diabetes mellitus ([place]), and Wears glasses.   Surgical History    Past Surgical History:  Procedure Laterality Date  . A-FLUTTER ABLATION N/A 09/24/2016  Procedure: A-Flutter Ablation;  Surgeon: Will Meredith Leeds, MD;  Location: [place] [facility];  Service: Cardiovascular;  Laterality: N/A;  . ABDOMINAL AORTOGRAM W/LOWER EXTREMITY N/A 04/13/2017   Procedure: ABDOMINAL AORTOGRAM W/LOWER EXTREMITY;  Surgeon: Conrad [place], MD;  Location: [place] [facility];  Service: Cardiovascular;  Laterality: N/A;  Bilater lower extermity  . AORTIC VALVE REPLACEMENT N/A 10/27/2019   Procedure: AORTIC VALVE REPLACEMENT (AVR) USING EDWARDS INTUITY 23 MM AORTIC VALVE.;  Surgeon: Wonda Olds, MD;  Location: [place];  Service: Open Heart Surgery;  Laterality: N/A;  . APPENDECTOMY    . AV FISTULA PLACEMENT Right 09/18/2014   Procedure: INSERTION OF ARTERIOVENOUS (AV) GORE-TEX GRAFT ARM USING 4-7MM  X 45CM STRETCH GORE-TEX VASCULAR GRAFT;  Surgeon: Rosetta Posner, MD;  Location: [place];  Service: Vascular;  Laterality: Right;  . AV FISTULA PLACEMENT Left 07/07/2016   Procedure: INSERTION OF LEFT BRACHIAL TO AXILLARY ARTERIOVENOUS (AV) GORE-TEX ARM GRAFT;  Surgeon: Conrad Barronett, MD;  Location: [place];  Service: Vascular;  Laterality: Left;  . AV FISTULA PLACEMENT Left 12/09/2018   Procedure: Insertion Of Arteriovenous  (Av) Gore-Tex Graft Arm, left arm;  Surgeon: Marty Heck, MD;  Location: [place];  Service: Vascular;  Laterality: Left;  . CARDIAC CATHETERIZATION  ~ 2016  . COLONOSCOPY    . 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 CORONARY ARTERY BYPASS GRAFT N/A 10/27/2019   Procedure: REDO CORONARY ARTERY BYPASS GRAFTING TIMES 3  USING LEFT GREATER SAPHENOUS LEG VEIN HARVESTED ENDOSCOPICALLY(CABG);  Surgeon: Wonda Olds, MD;  Location: [place];  Service: Open Heart Surgery;  Laterality: N/A;  . CORONARY ATHERECTOMY N/A 02/16/2019   Procedure: CORONARY ATHERECTOMY;  Surgeon: Martinique, Peter M, MD;  Location: [place] [facility];  Service: Cardiovascular;  Laterality: N/A;  . ENDOVEIN HARVEST OF GREATER SAPHENOUS VEIN Left 10/27/2019   Procedure: [place] Of Greater Saphenous Vein;  Surgeon: Wonda Olds, MD;  Location: [place];  Service: Open Heart Surgery;  Laterality: Left;  . EXCHANGE OF A DIALYSIS CATHETER Left 01/11/2018   Procedure: EXCHANGE OF TUNNELED DIALYSIS CATHETER;  Surgeon: Rosetta Posner, MD;  Location: [place];  Service: Vascular;  Laterality: Left;  . HERNIA REPAIR  2017   with nephrectomy  . INSERTION OF DIALYSIS CATHETER N/A 10/08/2017   Procedure: INSERTION OF TUNNELED DIALYSIS CATHETER;  Surgeon: Conrad , MD;  Location: [place];  Service: Vascular;  Laterality: N/A;  . IR FLUORO GUIDE CV LINE LEFT  03/12/2018  . IR GENERIC HISTORICAL  05/11/2016   IR FLUORO GUIDE CV LINE LEFT 05/11/2016 Corrie Mckusick, DO [facility]  . IR GENERIC HISTORICAL  05/11/2016   IR US GUIDE VASC ACCESS LEFT 05/11/2016 Corrie Mckusick, DO [facility]  . IR GENERIC HISTORICAL  05/11/2016   IR US GUIDE VASC ACCESS RIGHT 05/11/2016 Corrie Mckusick, DO [facility]  . IR GENERIC HISTORICAL  05/11/2016   IR RADIOLOGY PERIPHERAL GUIDED IV START 05/11/2016 Corrie Mckusick, DO [facility]  . KIDNEY TRANSPLANT  2009  . LEFT HEART  CATH AND CORONARY ANGIOGRAPHY N/A 02/15/2019   Procedure: LEFT HEART CATH AND CORONARY ANGIOGRAPHY;  Surgeon: Martinique, Peter M, MD;  Location: [place] [facility];  Service: Cardiovascular;  Laterality: N/A;  . LEFT HEART CATH AND CORS/GRAFTS ANGIOGRAPHY N/A 12/04/2017   Procedure: LEFT HEART CATH AND CORS/GRAFTS ANGIOGRAPHY;  Surgeon: Belva Crome, MD;  Location:  [place] [facility];  Service: Cardiovascular;  Laterality: N/A;  . LEFT HEART CATH AND CORS/GRAFTS ANGIOGRAPHY N/A 10/25/2019   Procedure: LEFT HEART CATH AND CORS/GRAFTS ANGIOGRAPHY;  Surgeon: Martinique, Peter M, MD;  Location: [place] [facility];  Service: Cardiovascular;  Laterality: N/A;  . NEPHRECTOMY  2017   transplant rejected   . PERIPHERAL VASCULAR CATHETERIZATION N/A 06/04/2016   Procedure: Upper Extremity Venography;  Surgeon: Waynetta Sandy, MD;  Location: [place] [facility];  Service: Cardiovascular;  Laterality: N/A;  . PERIPHERAL VASCULAR INTERVENTION  04/13/2017   Procedure: PERIPHERAL VASCULAR INTERVENTION;  Surgeon: Conrad Rutledge, MD;  Location: [place] [facility];  Service: Cardiovascular;;  Lt. Common/Exernal  Iliac  . TEE WITHOUT CARDIOVERSION N/A 10/27/2019   Procedure: TRANSESOPHAGEAL ECHOCARDIOGRAM (TEE);  Surgeon: Wonda Olds, MD;  Location: [place];  Service: Open Heart Surgery;  Laterality: N/A;  . THROMBECTOMY AND REVISION OF ARTERIOVENTOUS (AV) GORETEX  GRAFT Left 10/08/2017   Procedure: THROMBECTOMY of ARTERIOVENTOUS (AV) GORETEX  GRAFT LEFT UPPER ARM;  Surgeon: Conrad [place]wyn, MD;  Location: [place];  Service: Vascular;  Laterality: Left;  . THROMBECTOMY W/ EMBOLECTOMY Left 09/14/2017   Procedure: THROMBECTOMY ARTERIOVENOUS GORE-TEX GRAFT LEFT UPPER ARM;  Surgeon: Rosetta Posner, MD;  Location: [place];  Service: Vascular;  Laterality: Left;  . UPPER EXTREMITY ANGIOGRAPHY Bilateral 10/29/2018   Procedure: UPPER EXTREMITY ANGIOGRAPHY;  Surgeon: Marty Heck, MD;  Location: [facility];  Service:  Cardiovascular;  Laterality: Bilateral;  . UPPER EXTREMITY VENOGRAPHY N/A 11/16/2017   Procedure: UPPER EXTREMITY VENOGRAPHY - Right Arm;  Surgeon: Conrad [place], MD;  Location: [facility];  Service: Cardiovascular;  Laterality: N/A;  . UPPER EXTREMITY VENOGRAPHY Bilateral 10/29/2018   Procedure: UPPER EXTREMITY VENOGRAPHY;  Surgeon: Marty Heck, MD;  Location: [place] [facility];  Service: Cardiovascular;  Laterality: Bilateral;     Social History   reports that he quit smoking about 5 years ago. His smoking use included cigarettes. He has never used smokeless tobacco. He reports current alcohol use. He reports that he does not use drugs.   Family History   His family history includes HIV in his sister; Hyperlipidemia in his mother; Hypertension in his mother.   Allergies Allergies  Allergen Reactions  . Baclofen Other (See Comments)    Possibly stroke like symptoms  . Iodinated Diagnostic Agents Swelling, Rash and Other (See Comments)    Other Reaction: burning to mouth, swelling of lips  . Lipitor [Atorvastatin] Other (See Comments)    Leg pain  . Metoprolol Other (See Comments)    Headaches      Home Medications  Prior to Admission medications   Medication Sig Start Date End Date Taking? Authorizing Provider  acetaminophen (TYLENOL) 500 MG tablet Take 1,000 mg by mouth every 6 (six) hours as needed for moderate pain or headache.   Yes [provider]  albuterol (PROVENTIL HFA;VENTOLIN HFA) 108 (90 Base) MCG/ACT inhaler Inhale 1-2 puffs into the lungs every 6 (six) hours as needed for wheezing or shortness of breath. 12/18/17  Yes Richardson Dopp T, PA-C  aspirin EC 81 MG tablet Take 81 mg by mouth daily.    Yes [provider]  calcium acetate (PHOSLO) 667 MG capsule Take 1,334-2,001 mg by mouth See admin instructions. 3 capsules with dinner and 2 capsules with snacks   Yes [provider]  calcium carbonate (OS-CAL - DOSED IN MG OF ELEMENTAL  CALCIUM) 1250 (500  Ca) MG tablet Take 2 tablets by mouth daily.  11/29/13  Yes [provider]  clopidogrel (PLAVIX) 75 MG tablet TAKE 1 TABLET(75 MG) BY MOUTH DAILY Patient taking differently: Take 75 mg by mouth daily.  03/03/19  Yes Richardson Dopp T, PA-C  Colchicine 0.6 MG CAPS Take 0.6 mg by mouth See admin instructions. Take 0.6 mg when first symptoms of gout flare occur, then take another 0.6 mg 14 days later as needed for gout 10/13/18  Yes [provider]  dextromethorphan (DELSYM) 30 MG/5ML liquid Take 30 mg by mouth as needed for cough.   Yes [provider]  Dextromethorphan-guaiFENesin 5-100 MG/5ML LIQD Take 15 mLs by mouth 2 (two) times daily as needed (Cough).  06/03/18  Yes [provider]  docusate sodium (COLACE) 100 MG capsule Take 100 mg by mouth daily as needed for mild constipation.   Yes [provider]  ezetimibe (ZETIA) 10 MG tablet TAKE 1 TABLET(10 MG) BY MOUTH DAILY Patient taking differently: Take 10 mg by mouth daily.  05/09/19  Yes Weaver, Scott T, PA-C  isosorbide mononitrate (IMDUR) 30 MG 24 hr tablet Take 1 tablet (30 mg total) by mouth daily. 02/24/19  Yes Daune Perch, NP  lidocaine-prilocaine (EMLA) cream Apply 1 application topically daily as needed (Numbing).  03/21/19  Yes [provider]  metoprolol succinate (TOPROL XL) 25 MG 24 hr tablet Take 1 tablet (25 mg total) by mouth daily. 05/05/19  Yes Daune Perch, NP  multivitamin (RENA-VIT) TABS tablet Take 1 tablet by mouth daily.    Yes [provider]  neomycin-bacitracin-polymyxin (NEOSPORIN) ointment Apply 1 application topically 2 (two) times daily. Apply to feet.   Yes [provider]  nitroGLYCERIN (NITROSTAT) 0.4 MG SL tablet Place 1 tablet (0.4 mg total) under the tongue every 5 (five) minutes as needed for chest pain. 09/16/19 12/15/19 Yes Fay Records, MD  repaglinide (PRANDIN) 0.5 MG tablet Take 1 tablet (0.5 mg total) by mouth 2 (two)  times daily before a meal. 08/23/19  Yes Renato Shin, MD  rosuvastatin (CRESTOR) 10 MG tablet Take 1 tablet (10 mg total) by mouth daily. 02/24/19  Yes Daune Perch, NP  silver sulfADIAZINE (SILVADENE) 1 % cream Apply 1 application topically daily. Patient taking differently: Apply 1 application topically 2 (two) times daily.  10/18/19  Yes Trula Slade, DPM  traMADol (ULTRAM) 50 MG tablet Take 50 mg by mouth 2 (two) times daily as needed for moderate pain.  05/19/18  Yes [provider]  doxycycline (VIBRA-TABS) 100 MG tablet Take 1 tablet (100 mg total) by mouth 2 (two) times daily. Patient not taking: Reported on 10/24/2019 09/06/19   Trula Slade, DPM  Methoxy PEG-Epoetin Beta (MIRCERA IJ) Mircera 09/05/19 09/03/20  [provider]  oxyCODONE-acetaminophen (PERCOCET/ROXICET) 5-325 MG tablet Take 1 tablet by mouth every 6 (six) hours as needed. Patient not taking: Reported on 10/24/2019 12/09/18   Ulyses Amor, PA-C  pantoprazole (PROTONIX) 40 MG tablet Take 1 tablet (40 mg total) by mouth daily. Patient not taking: Reported on 10/24/2019 11/08/18   Leanor Kail, PA     Mitzi Hansen, MD INTERNAL MEDICINE RESIDENT PGY-1 10/29/19  7:16 AM

## 2019-10-30 DIAGNOSIS — Z951 Presence of aortocoronary bypass graft: Secondary | ICD-10-CM | POA: Diagnosis not present

## 2019-10-30 DIAGNOSIS — J9601 Acute respiratory failure with hypoxia: Secondary | ICD-10-CM | POA: Diagnosis not present

## 2019-10-30 DIAGNOSIS — R570 Cardiogenic shock: Secondary | ICD-10-CM | POA: Diagnosis not present

## 2019-10-30 LAB — RENAL FUNCTION PANEL
Albumin: 2.6 g/dL — ABNORMAL LOW (ref 3.5–5.0)
Albumin: 2.6 g/dL — ABNORMAL LOW (ref 3.5–5.0)
Anion gap: 12 (ref 5–15)
Anion gap: 11 (ref 5–15)
BUN: 21 mg/dL (ref 8–23)
BUN: 19 mg/dL (ref 8–23)
CO2: 23 mmol/L (ref 22–32)
CO2: 23 mmol/L (ref 22–32)
Calcium: 7.5 mg/dL — ABNORMAL LOW (ref 8.9–10.3)
Calcium: 7.5 mg/dL — ABNORMAL LOW (ref 8.9–10.3)
Chloride: 105 mmol/L (ref 98–111)
Chloride: 103 mmol/L (ref 98–111)
Creatinine, Ser: 2.94 mg/dL — ABNORMAL HIGH (ref 0.61–1.24)
Creatinine, Ser: 2.73 mg/dL — ABNORMAL HIGH (ref 0.61–1.24)
GFR calc Af Amer: 25 mL/min — ABNORMAL LOW (ref >60)
GFR calc Af Amer: 27 mL/min — ABNORMAL LOW (ref >60)
GFR calc non Af Amer: 21 mL/min — ABNORMAL LOW (ref >60)
GFR calc non Af Amer: 23 mL/min — ABNORMAL LOW (ref >60)
Glucose, Bld: 150 mg/dL — ABNORMAL HIGH (ref 70–99)
Glucose, Bld: 132 mg/dL — ABNORMAL HIGH (ref 70–99)
Phosphorus: 2.4 mg/dL — ABNORMAL LOW (ref 2.5–4.6)
Phosphorus: 2.2 mg/dL — ABNORMAL LOW (ref 2.5–4.6)
Potassium: 4.6 mmol/L (ref 3.5–5.1)
Potassium: 5.3 mmol/L — ABNORMAL HIGH (ref 3.5–5.1)
Sodium: 140 mmol/L (ref 135–145)
Sodium: 137 mmol/L (ref 135–145)

## 2019-10-30 LAB — TYPE AND SCREEN
ABO/RH(D): O POS
Antibody Screen: POSITIVE
Unit division: 0
Unit division: 0
Unit division: 0
Unit division: 0
Unit division: 0
Unit division: 0
Unit division: 0
Unit division: 0
Unit division: 0
Unit division: 0

## 2019-10-30 LAB — BPAM RBC
Blood Product Expiration Date: 202104202359
Blood Product Expiration Date: 202104202359
Blood Product Expiration Date: 202105052359
Blood Product Expiration Date: 202105082359
Blood Product Expiration Date: 202105082359
Blood Product Expiration Date: 202105082359
Blood Product Expiration Date: 202105082359
Blood Product Expiration Date: 202105082359
Blood Product Expiration Date: 202105092359
Blood Product Expiration Date: 202105102359
ISSUE DATE / TIME: 202104011321
ISSUE DATE / TIME: 202104011321
ISSUE DATE / TIME: 202104011416
ISSUE DATE / TIME: 202104011416
ISSUE DATE / TIME: 202104011431
ISSUE DATE / TIME: 202104011431
ISSUE DATE / TIME: 202104012035
ISSUE DATE / TIME: 202104012309
ISSUE DATE / TIME: 202104012309
Unit Type and Rh: 5100
Unit Type and Rh: 5100
Unit Type and Rh: 5100
Unit Type and Rh: 5100
Unit Type and Rh: 5100
Unit Type and Rh: 5100
Unit Type and Rh: 9500
Unit Type and Rh: 9500
Unit Type and Rh: 9500
Unit Type and Rh: 9500

## 2019-10-30 LAB — CBC
HCT: 32 % — ABNORMAL LOW (ref 39.0–52.0)
Hemoglobin: 10.5 g/dL — ABNORMAL LOW (ref 13.0–17.0)
MCH: 30.6 pg (ref 26.0–34.0)
MCHC: 32.8 g/dL (ref 30.0–36.0)
MCV: 93.3 fL (ref 80.0–100.0)
Platelets: 31 10*3/uL — ABNORMAL LOW (ref 150–400)
RBC: 3.43 MIL/uL — ABNORMAL LOW (ref 4.22–5.81)
RDW: 17.8 % — ABNORMAL HIGH (ref 11.5–15.5)
WBC: 13.7 10*3/uL — ABNORMAL HIGH (ref 4.0–10.5)
nRBC: 3.5 % — ABNORMAL HIGH (ref 0.0–0.2)

## 2019-10-30 LAB — GLUCOSE, CAPILLARY
Glucose-Capillary: 111 mg/dL — ABNORMAL HIGH (ref 70–99)
Glucose-Capillary: 120 mg/dL — ABNORMAL HIGH (ref 70–99)
Glucose-Capillary: 120 mg/dL — ABNORMAL HIGH (ref 70–99)
Glucose-Capillary: 123 mg/dL — ABNORMAL HIGH (ref 70–99)
Glucose-Capillary: 136 mg/dL — ABNORMAL HIGH (ref 70–99)
Glucose-Capillary: 136 mg/dL — ABNORMAL HIGH (ref 70–99)
Glucose-Capillary: 71 mg/dL (ref 70–99)
Glucose-Capillary: 87 mg/dL (ref 70–99)
Glucose-Capillary: 91 mg/dL (ref 70–99)

## 2019-10-30 LAB — MAGNESIUM: Magnesium: 2.4 mg/dL (ref 1.7–2.4)

## 2019-10-30 LAB — APTT: aPTT: 58 seconds — ABNORMAL HIGH (ref 24–36)

## 2019-10-30 MED ORDER — LEVALBUTEROL HCL 0.63 MG/3ML IN NEBU
0.6300 mg | INHALATION_SOLUTION | Freq: Four times a day (QID) | RESPIRATORY_TRACT | Status: DC | PRN
  Administered 2019-11-08 – 2019-12-12 (×20): 0.63 mg via RESPIRATORY_TRACT
  Filled 2019-10-30 (×20): qty 3

## 2019-10-30 MED ORDER — DOCUSATE SODIUM 100 MG PO CAPS: 200.0000 mg | ORAL_CAPSULE | Freq: Every day | ORAL | Status: DC

## 2019-10-30 MED ORDER — RENA-VITE PO TABS
1.0000 | ORAL_TABLET | Freq: Every day | ORAL | Status: DC
  Administered 2019-10-31: 1 via ORAL
  Filled 2019-10-30: qty 1

## 2019-10-30 MED ORDER — ORAL CARE MOUTH RINSE
15.0000 mL | Freq: Two times a day (BID) | OROMUCOSAL | Status: DC
  Administered 2019-10-30 – 2019-11-02 (×6): 15 mL via OROMUCOSAL

## 2019-10-30 MED ORDER — INSULIN ASPART 100 UNIT/ML ~~LOC~~ SOLN: 0.0000 [IU] | Freq: Three times a day (TID) | SUBCUTANEOUS | Status: DC

## 2019-10-30 MED ORDER — PANTOPRAZOLE SODIUM 40 MG PO TBEC: 40.0000 mg | DELAYED_RELEASE_TABLET | Freq: Every day | ORAL | Status: DC

## 2019-10-30 MED ORDER — INSULIN ASPART 100 UNIT/ML ~~LOC~~ SOLN
0.0000 [IU] | SUBCUTANEOUS | Status: DC
  Administered 2019-10-31: 1 [IU] via SUBCUTANEOUS
  Administered 2019-11-01: 04:00:00 2 [IU] via SUBCUTANEOUS
  Administered 2019-11-01 – 2019-11-02 (×5): 1 [IU] via SUBCUTANEOUS
  Administered 2019-11-02 – 2019-11-03 (×2): 2 [IU] via SUBCUTANEOUS
  Administered 2019-11-03 (×2): 1 [IU] via SUBCUTANEOUS

## 2019-10-30 MED ORDER — CHLORHEXIDINE GLUCONATE 0.12 % MT SOLN
15.0000 mL | Freq: Two times a day (BID) | OROMUCOSAL | Status: DC
  Administered 2019-10-30 – 2019-11-03 (×8): 15 mL via OROMUCOSAL
  Filled 2019-10-30 (×4): qty 15

## 2019-10-30 MED ORDER — ROSUVASTATIN CALCIUM 5 MG PO TABS
10.0000 mg | ORAL_TABLET | Freq: Every day | ORAL | Status: DC
  Administered 2019-10-31: 19:00:00 10 mg via ORAL
  Filled 2019-10-30: qty 2

## 2019-10-30 NOTE — Progress Notes (Signed)
[facility] Progress Note  Subjective: pressors down a bit per RN, still on 3.  I/O net neg  1.7L yesterday. Tolerating 75 cc/hr UF.  Pt extubated this am.  IABP removed yesterday.   Vitals:   10/30/19 1015 10/30/19 1030 10/30/19 1045 10/30/19 1100  BP:      Pulse: 94 94 94 94  Resp: (!) 28 (!) 26 (!) 34 (!) 27  Temp:      TempSrc:      SpO2: 90% 96% 98% 95%  Weight:      Height:        Exam:  gen responsive, extubated, on [place] O2  facial edema   Chest - coarse BS    Cor no Rub or gallop    Abd soft obese    Ext 2+ edema UE's > LE's    Neuro on sedation    L IJ TDC , LUA AVG not in use/ clotted    Dialysis: [name] MWF   4h  99.5  2K/3.5Ca  P4  TDC   Hep 3000  - mircera 75 last 3/22  - hect 1   Assessment/ Plan: 1. CP/ ACS/ prior CABG '97 - sp CABG on 4/1 2. Cardiogenic shock - IABP out, still on 3 pressors but weaning some 3. ESRD - usual HD MWF, on CRRT now, will continue. Using all 4/2.5. No clotting, was on argatroban but was stopped this am. Plts very low. No heparin.   4. Volume - vol excess, tolerating 75-100 cc/hr UF net neg 5. Resp failure - extubated today 6. Anemia abl/ ckd - tsat 16%, ferr 1293.  Last esa was OP on 3/22, next dose due around 4/5 but  Hb > 11, no esa for now.  7. MBD ckd - hect 1ug tiw, phoslo ac when eating    Patrick [name] 10/30/2019, 11:57 AM   Recent Labs  Lab 10/29/19 0410 10/29/19 0410 10/29/19 1558 10/30/19 0506  K 4.8   < > 4.9 4.6  BUN 27*   < > 21 21  CREATININE 4.48*   < > 3.61* 2.94*  CALCIUM 7.2*   < > 7.2* 7.5*  PHOS 3.7   < > 2.9 2.4*  HGB 11.6*  --   --  10.5*   < > = values in this interval not displayed.   Inpatient medications: . acetaminophen  1,000 mg Oral Q6H   Or  . acetaminophen (TYLENOL) oral liquid 160 mg/5 mL  1,000 mg Per Tube Q6H  . aspirin EC  325 mg Oral Daily   Or  . aspirin  324 mg Per Tube Daily  . bisacodyl  10 mg Oral Daily   Or  . bisacodyl  10 mg Rectal Daily  .  chlorhexidine  15 mL Mouth Rinse BID  . Chlorhexidine Gluconate Cloth  6 each Topical Daily  . docusate sodium  200 mg Oral Daily  . feeding supplement (PRO-STAT SUGAR FREE 64)  30 mL Per Tube TID  . insulin aspart  0-9 Units Subcutaneous TID WC  . levalbuterol  0.63 mg Nebulization Q6H  . mouth rinse  15 mL Mouth Rinse q12n4p  . metoprolol tartrate  12.5 mg Oral BID   Or  . metoprolol tartrate  12.5 mg Per Tube BID  . multivitamin  1 tablet Oral QHS  . pantoprazole  40 mg Oral Daily  . rosuvastatin  10 mg Oral q1800  . sodium chloride flush  10-40 mL Intracatheter Q12H  . sodium chloride  flush  3 mL Intravenous Q12H   .  prismasol BGK 4/2.5 400 mL/hr at 10/30/19 0229  .  prismasol BGK 4/2.5 200 mL/hr at 10/29/19 1813  . sodium chloride 10 mL/hr at 10/30/19 1100  . sodium chloride 10 mL/hr at 10/30/19 1100  . dexmedetomidine (PRECEDEX) IV infusion Stopped (10/30/19 1779)  . epinephrine 2 mcg/min (10/30/19 1100)  . nitroGLYCERIN Stopped (10/27/19 1722)  . norepinephrine (LEVOPHED) Adult infusion 7 mcg/min (10/30/19 1100)  . phenylephrine (NEO-SYNEPHRINE) Adult infusion    . prismasol BGK 4/2.5 1,800 mL/hr at 10/30/19 1014  . vasopressin (PITRESSIN) infusion - *FOR SHOCK* 0.04 Units/min (10/30/19 1100)   sodium chloride, sodium chloride, dextrose, heparin, metoprolol tartrate, morphine injection, ondansetron (ZOFRAN) IV, oxyCODONE, sodium chloride flush, sodium chloride flush, traMADol

## 2019-10-30 NOTE — Progress Notes (Signed)
3 Days Post-Op Procedure(s) (LRB): REDO CORONARY ARTERY BYPASS GRAFTING TIMES 3  USING LEFT GREATER SAPHENOUS LEG VEIN HARVESTED ENDOSCOPICALLY(CABG) (N/A) TRANSESOPHAGEAL ECHOCARDIOGRAM (TEE) (N/A) AORTIC VALVE REPLACEMENT (AVR) USING EDWARDS INTUITY 23 MM AORTIC VALVE. (N/A) Endovein Harvest Of Greater Saphenous Vein (Left) Subjective: Extubated this AM,  IABP removed last night due to no pulses  Objective: Vital signs in last 24 hours: Temp:  [95 F (35 C)-98 F (36.7 C)] 98 F (36.7 C) (04/04 0303) Pulse Rate:  [46-95] 94 (04/04 0845) Cardiac Rhythm: Atrial paced (04/04 0800) Resp:  [17-49] 25 (04/04 0845) BP: (122-146)/(36-62) 122/62 (04/04 0744) SpO2:  [89 %-100 %] 97 % (04/04 0845) Arterial Line BP: (84-156)/(25-66) 97/51 (04/04 0845) FiO2 (%):  [40 %] 40 % (04/04 0800) Weight:  [115.5 kg] 115.5 kg (04/04 0530)  Hemodynamic parameters for last 24 hours:    Intake/Output from previous day: 04/03 0701 - 04/04 0700 In: 3300.2 [I.V.:1629.7; NG/GT:1420.5; IV Piggyback:250] Out: 4993 [Chest Tube:400] Intake/Output this shift: Total I/O In: 80.2 [I.V.:55.2; NG/GT:25] Out: 145 [Other:135; Chest Tube:10]  General appearance: alert, cooperative and no distress Neurologic: intact Heart: regular rate and rhythm Lungs: wheezes bilaterally Extremities: Both feet warm, neuro intact  Lab Results: Recent Labs    10/29/19 0410 10/30/19 0506  WBC 14.9* 13.7*  HGB 11.6* 10.5*  HCT 34.8* 32.0*  PLT 28* 31*   BMET:  Recent Labs    10/29/19 1558 10/30/19 0506  NA 138 140  K 4.9 4.6  CL 105 105  CO2 23 23  GLUCOSE 183* 150*  BUN 21 21  CREATININE 3.61* 2.94*  CALCIUM 7.2* 7.5*    PT/INR:  Recent Labs    10/28/19 0315  LABPROT 17.0*  INR 1.4*   ABG    Component Value Date/Time   PHART 7.306 (L) 10/28/2019 1825   HCO3 22.6 10/28/2019 1825   TCO2 24 10/28/2019 1825   ACIDBASEDEF 4.0 (H) 10/28/2019 1825   O2SAT 94.0 10/28/2019 1825   CBG (last 3)  Recent  Labs    10/30/19 0438 10/30/19 0630 10/30/19 0833  GLUCAP 136* 111* 91    Assessment/Plan: S/P Procedure(s) (LRB): REDO CORONARY ARTERY BYPASS GRAFTING TIMES 3  USING LEFT GREATER SAPHENOUS LEG VEIN HARVESTED ENDOSCOPICALLY(CABG) (N/A) TRANSESOPHAGEAL ECHOCARDIOGRAM (TEE) (N/A) AORTIC VALVE REPLACEMENT (AVR) USING EDWARDS INTUITY 23 MM AORTIC VALVE. (N/A) Endovein Harvest Of Greater Saphenous Vein (Left) -CV- POD # 2  In SR  IABP out, foot well perfused   On Epi @ 2, norepi @ 9, vasopressin 0.04- weaning RESP- extubated this AM, IS RENAL- on CVVHD, -1600 last 24 hours, lytes OK ENDO- CBG better- off insulin drip- CBg SSI Q 4 Gi- advance diet later if remains stable Thrombocytopenia- PLT count stable on argatroban   LOS: 6 days    Melrose Nakayama 10/30/2019

## 2019-10-30 NOTE — Progress Notes (Signed)
ANTICOAGULATION CONSULT NOTE - Follow Up  Consult  Pharmacy Consult for argatroban Indication: R/o HIT / IABP   Allergies  Allergen Reactions  . Baclofen Other (See Comments)    Possibly stroke like symptoms  . Iodinated Diagnostic Agents Swelling, Rash and Other (See Comments)    Other Reaction: burning to mouth, swelling of lips  . Lipitor [Atorvastatin] Other (See Comments)    Leg pain  . Metoprolol Other (See Comments)    Headaches     Patient Measurements: Height: 5' 6.5" (168.9 cm) Weight: 115.5 kg (254 lb 10.1 oz) IBW/kg (Calculated) : 64.95  Vital Signs: Temp: 98 F (36.7 C) (04/04 0800) Temp Source: Oral (04/04 0800) BP: 122/62 (04/04 0744) Pulse Rate: 94 (04/04 1100)  Labs: Recent Labs    10/27/19 1827 10/27/19 2011 10/28/19 0315 10/28/19 7106 10/28/19 1611 10/28/19 1611 10/28/19 1825 10/29/19 0108 10/29/19 0410 10/29/19 0410 10/29/19 0929 10/29/19 1355 10/29/19 1558 10/30/19 0506  HGB  --    < > 12.8*   < > 12.2*   < > 11.6*  --  11.6*  --   --   --   --  10.5*  HCT  --    < > 37.5*   < > 35.2*   < > 34.0*  --  34.8*  --   --   --   --  32.0*  PLT  --    < > 48*   < > 29*  --   --   --  28*  --   --   --   --  31*  APTT >200*   < > 32  --   --   --   --    < > 74*   < > 68* 61*  --  58*  LABPROT 25.0*  --  17.0*  --   --   --   --   --   --   --   --   --   --   --   INR 2.3*  --  1.4*  --   --   --   --   --   --   --   --   --   --   --   CREATININE  --   --  7.21*   < > 5.73*   < >  --   --  4.48*  --   --   --  3.61* 2.94*   < > = values in this interval not displayed.    Estimated Creatinine Clearance: 30.2 mL/min (A) (by C-G formula based on SCr of 2.94 mg/dL (H)).   Medical History: Past Medical History:  Diagnosis Date  . Arthritis   . Atrial flutter ([place])    Ablation 2018  . 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 Erectile dysfunction   . ESRD (end stage renal disease) on dialysis ([place])    Pt on HD 2003 >> transplanted in 2009, back on HD in 2016. [name]TS.   . Essential hypertension   . Gout   . Hernia of abdominal wall   . History of blood transfusion   . History of cardiomyopathy   . History of kidney stones   . History of pneumonia   . Migraine   . Myocardial  infarction ([place])    1996  . Pneumonia   . Secondary hyperparathyroidism ([place])   . Type 2 diabetes mellitus ([place])   . Wears glasses     Medications:  Medications Prior to Admission  Medication Sig Dispense Refill Last Dose  . acetaminophen (TYLENOL) 500 MG tablet Take 1,000 mg by mouth every 6 (six) hours as needed for moderate pain or headache.   Past Week at Unknown time  . albuterol (PROVENTIL HFA;VENTOLIN HFA) 108 (90 Base) MCG/ACT inhaler Inhale 1-2 puffs into the lungs every 6 (six) hours as needed for wheezing or shortness of breath. 1 Inhaler 0 unk  . aspirin EC 81 MG tablet Take 81 mg by mouth daily.    10/24/2019 at Unknown time  . calcium acetate (PHOSLO) 667 MG capsule Take 1,334-2,001 mg by mouth See admin instructions. 3 capsules with dinner and 2 capsules with snacks   Past Week at Unknown time  . calcium carbonate (OS-CAL - DOSED IN MG OF ELEMENTAL CALCIUM) 1250 (500 Ca) MG tablet Take 2 tablets by mouth daily.    Past Month at Unknown time  . clopidogrel (PLAVIX) 75 MG tablet TAKE 1 TABLET(75 MG) BY MOUTH DAILY (Patient taking differently: Take 75 mg by mouth daily. ) 90 tablet 3 Past Week at 2000  . Colchicine 0.6 MG CAPS Take 0.6 mg by mouth See admin instructions. Take 0.6 mg when first symptoms of gout flare occur, then take another 0.6 mg 14 days later as needed for gout   unk  . dextromethorphan (DELSYM) 30 MG/5ML liquid Take 30 mg by mouth as needed for cough.   unk  . Dextromethorphan-guaiFENesin 5-100 MG/5ML LIQD Take 15 mLs by mouth 2 (two) times daily  as needed (Cough).    unk  . docusate sodium (COLACE) 100 MG capsule Take 100 mg by mouth daily as needed for mild constipation.   unk  . ezetimibe (ZETIA) 10 MG tablet TAKE 1 TABLET(10 MG) BY MOUTH DAILY (Patient taking differently: Take 10 mg by mouth daily. ) 90 tablet 3 Past Week at Unknown time  . isosorbide mononitrate (IMDUR) 30 MG 24 hr tablet Take 1 tablet (30 mg total) by mouth daily. 90 tablet 3 Past Week at Unknown time  . lidocaine-prilocaine (EMLA) cream Apply 1 application topically daily as needed (Numbing).    Past Month at Unknown time  . metoprolol succinate (TOPROL XL) 25 MG 24 hr tablet Take 1 tablet (25 mg total) by mouth daily. 90 tablet 3 Past Week at 2100  . multivitamin (RENA-VIT) TABS tablet Take 1 tablet by mouth daily.    Past Week at Unknown time  . neomycin-bacitracin-polymyxin (NEOSPORIN) ointment Apply 1 application topically 2 (two) times daily. Apply to feet.     . nitroGLYCERIN (NITROSTAT) 0.4 MG SL tablet Place 1 tablet (0.4 mg total) under the tongue every 5 (five) minutes as needed for chest pain. 25 tablet 6 10/24/2019 at Unknown time  . repaglinide (PRANDIN) 0.5 MG tablet Take 1 tablet (0.5 mg total) by mouth 2 (two) times daily before a meal. 60 tablet 4 Past Week at Unknown time  . rosuvastatin (CRESTOR) 10 MG tablet Take 1 tablet (10 mg total) by mouth daily. 90 tablet 3 Past Week at Unknown time  . silver sulfADIAZINE (SILVADENE) 1 % cream Apply 1 application topically daily. (Patient taking differently: Apply 1 application topically 2 (two) times daily. ) 50 g 0 Past Week at Unknown time  . traMADol (ULTRAM) 50 MG tablet Take 50  mg by mouth 2 (two) times daily as needed for moderate pain.    Past Week at Unknown time  . doxycycline (VIBRA-TABS) 100 MG tablet Take 1 tablet (100 mg total) by mouth 2 (two) times daily. (Patient not taking: Reported on 10/24/2019) 20 tablet 0 Completed Course at Unknown time  . Methoxy PEG-Epoetin Beta (MIRCERA IJ) Mircera     .  oxyCODONE-acetaminophen (PERCOCET/ROXICET) 5-325 MG tablet Take 1 tablet by mouth every 6 (six) hours as needed. (Patient not taking: Reported on 10/24/2019) 12 tablet 0 Not Taking at Unknown time  . pantoprazole (PROTONIX) 40 MG tablet Take 1 tablet (40 mg total) by mouth daily. (Patient not taking: Reported on 10/24/2019) 90 tablet 3 Not Taking at Unknown time    Assessment: 38 YOM s/p redo CABG and AVR now with dropping platelets concerning for HIT. 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 H/H stable s/p transfusion. Plt down to 28k. Only slight bleeding from CT drainage Argatroban 0.3 mcg/kg/min aptt 58sec within therapeutic  IABP pulled early this am  HIT antibody negative  Discussed with MD - no other reason for anticoagulation H/h stable pltc slightly improved 28>31  Goal of Therapy:  aPTT 50-65 seconds Monitor platelets by anticoagulation protocol: Yes   Plan:  Stop argatroban Follow up [place].D. CPP, BCPS Clinical Pharmacist [phone] 10/30/2019 11:44 AM

## 2019-10-30 NOTE — Evaluation (Signed)
Clinical/Bedside Swallow Evaluation Patient Details  Name: Patrick [name] MRN: [id number] Date of Birth: 1955/10/31  Today's Date: 10/30/2019 Time: SLP Start Time (ACUTE ONLY): 1314 SLP Stop Time (ACUTE ONLY): 1324 SLP Time Calculation (min) (ACUTE ONLY): 10 min  Past Medical History:  Past Medical History:  Diagnosis Date  . Arthritis   . Atrial flutter ([place])    Ablation 2018  . 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 Erectile dysfunction   . ESRD (end stage renal disease) on dialysis ([place])    Pt on HD 2003 >> transplanted in 2009, back on HD in 2016. [name] TTS.   . Essential hypertension   . Gout   . Hernia of abdominal wall   . History of blood transfusion   . History of cardiomyopathy   . History of kidney stones   . History of pneumonia   . Migraine   . Myocardial infarction ([place])    1996  . Pneumonia   . Secondary hyperparathyroidism ([place])   . Type 2 diabetes mellitus ([place])   . Wears glasses    Past Surgical History:  Past Surgical History:  Procedure Laterality Date  . A-FLUTTER ABLATION N/A 09/24/2016   Procedure: A-Flutter Ablation;  Surgeon: Will Meredith Leeds, MD;  Location: [facility];  Service: Cardiovascular;  Laterality: N/A;  . ABDOMINAL AORTOGRAM W/LOWER EXTREMITY N/A 04/13/2017   Procedure: ABDOMINAL AORTOGRAM W/LOWER EXTREMITY;  Surgeon: Conrad [place], MD;  Location: [facility];  Service: Cardiovascular;  Laterality: N/A;  Bilater lower extermity  . AORTIC VALVE REPLACEMENT N/A 10/27/2019   Procedure: AORTIC VALVE REPLACEMENT (AVR) USING EDWARDS INTUITY 23 MM AORTIC VALVE.;  Surgeon: Wonda Olds, MD;  Location: [facility];  Service: Open Heart Surgery;  Laterality: N/A;  . APPENDECTOMY    . AV FISTULA PLACEMENT Right 09/18/2014   Procedure: INSERTION OF ARTERIOVENOUS  (AV) GORE-TEX GRAFT ARM USING 4-7MM  X 45CM STRETCH GORE-TEX VASCULAR GRAFT;  Surgeon: Rosetta Posner, MD;  Location: [facility];  Service: Vascular;  Laterality: Right;  . AV FISTULA PLACEMENT Left 07/07/2016   Procedure: INSERTION OF LEFT BRACHIAL TO AXILLARY ARTERIOVENOUS (AV) GORE-TEX ARM GRAFT;  Surgeon: Conrad [place]uda, MD;  Location: [facility];  Service: Vascular;  Laterality: Left;  . AV FISTULA PLACEMENT Left 12/09/2018   Procedure: Insertion Of Arteriovenous (Av) Gore-Tex Graft Arm, left arm;  Surgeon: Marty Heck, MD;  Location: [facility];  Service: Vascular;  Laterality: Left;  . CARDIAC CATHETERIZATION  ~ 2016  . COLONOSCOPY    . 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 CORONARY ARTERY BYPASS GRAFT N/A 10/27/2019   Procedure: REDO CORONARY ARTERY BYPASS GRAFTING TIMES 3  USING LEFT GREATER SAPHENOUS LEG VEIN HARVESTED ENDOSCOPICALLY(CABG);  Surgeon: Wonda Olds, MD;  Location: [facility];  Service: Open Heart Surgery;  Laterality: N/A;  . CORONARY ATHERECTOMY N/A 02/16/2019   Procedure: CORONARY ATHERECTOMY;  Surgeon: Martinique, Peter M, MD;  Location: [facility];  Service: Cardiovascular;  Laterality: N/A;  . ENDOVEIN HARVEST OF GREATER SAPHENOUS VEIN Left 10/27/2019   Procedure: [place] Of Greater Saphenous Vein;  Surgeon: Wonda Olds, MD;  Location: [facility];  Service: Open Heart Surgery;  Laterality: Left;  . EXCHANGE OF  A DIALYSIS CATHETER Left 01/11/2018   Procedure: EXCHANGE OF TUNNELED DIALYSIS CATHETER;  Surgeon: Rosetta Posner, MD;  Location: [facility];  Service: Vascular;  Laterality: Left;  . HERNIA REPAIR  2017   with nephrectomy  . INSERTION OF DIALYSIS CATHETER N/A 10/08/2017   Procedure: INSERTION OF TUNNELED DIALYSIS CATHETER;  Surgeon: Conrad Bock, MD;  Location: [facility];  Service: Vascular;  Laterality: N/A;  . IR FLUORO GUIDE CV LINE LEFT  03/12/2018  . IR GENERIC HISTORICAL  05/11/2016    IR FLUORO GUIDE CV LINE LEFT 05/11/2016 Corrie Mckusick, DO [facility]  . IR GENERIC HISTORICAL  05/11/2016   IR US GUIDE VASC ACCESS LEFT 05/11/2016 Corrie Mckusick, DO [facility]  . IR GENERIC HISTORICAL  05/11/2016   IR US GUIDE VASC ACCESS RIGHT 05/11/2016 Corrie Mckusick, DO [facility]  . IR GENERIC HISTORICAL  05/11/2016   IR RADIOLOGY PERIPHERAL GUIDED IV START 05/11/2016 Corrie Mckusick, DO [facility]  . KIDNEY TRANSPLANT  2009  . LEFT HEART CATH AND CORONARY ANGIOGRAPHY N/A 02/15/2019   Procedure: LEFT HEART CATH AND CORONARY ANGIOGRAPHY;  Surgeon: Martinique, Peter M, MD;  Location: [place] [facility];  Service: Cardiovascular;  Laterality: N/A;  . LEFT HEART CATH AND CORS/GRAFTS ANGIOGRAPHY N/A 12/04/2017   Procedure: LEFT HEART CATH AND CORS/GRAFTS ANGIOGRAPHY;  Surgeon: Belva Crome, MD;  Location: [facility];  Service: Cardiovascular;  Laterality: N/A;  . LEFT HEART CATH AND CORS/GRAFTS ANGIOGRAPHY N/A 10/25/2019   Procedure: LEFT HEART CATH AND CORS/GRAFTS ANGIOGRAPHY;  Surgeon: Martinique, Peter M, MD;  Location: [facility];  Service: Cardiovascular;  Laterality: N/A;  . NEPHRECTOMY  2017   transplant rejected   . PERIPHERAL VASCULAR CATHETERIZATION N/A 06/04/2016   Procedure: Upper Extremity Venography;  Surgeon: Waynetta Sandy, MD;  Location: [facility];  Service: Cardiovascular;  Laterality: N/A;  . PERIPHERAL VASCULAR INTERVENTION  04/13/2017   Procedure: PERIPHERAL VASCULAR INTERVENTION;  Surgeon: Conrad Blanco, MD;  Location: [facility];  Service: Cardiovascular;;  Lt. Common/Exernal  Iliac  . TEE WITHOUT CARDIOVERSION N/A 10/27/2019   Procedure: TRANSESOPHAGEAL ECHOCARDIOGRAM (TEE);  Surgeon: Wonda Olds, MD;  Location: [facility];  Service: Open Heart Surgery;  Laterality: N/A;  . THROMBECTOMY AND REVISION OF ARTERIOVENTOUS (AV) GORETEX  GRAFT Left 10/08/2017   Procedure: THROMBECTOMY of ARTERIOVENTOUS (AV) GORETEX  GRAFT LEFT UPPER ARM;   Surgeon: Conrad Jansen, MD;  Location: [facility];  Service: Vascular;  Laterality: Left;  . THROMBECTOMY W/ EMBOLECTOMY Left 09/14/2017   Procedure: THROMBECTOMY ARTERIOVENOUS GORE-TEX GRAFT LEFT UPPER ARM;  Surgeon: Rosetta Posner, MD;  Location: [facility];  Service: Vascular;  Laterality: Left;  . UPPER EXTREMITY ANGIOGRAPHY Bilateral 10/29/2018   Procedure: UPPER EXTREMITY ANGIOGRAPHY;  Surgeon: Marty Heck, MD;  Location: [facility];  Service: Cardiovascular;  Laterality: Bilateral;  . UPPER EXTREMITY VENOGRAPHY N/A 11/16/2017   Procedure: UPPER EXTREMITY VENOGRAPHY - Right Arm;  Surgeon: Conrad [place]vue, MD;  Location: [facility];  Service: Cardiovascular;  Laterality: N/A;  . UPPER EXTREMITY VENOGRAPHY Bilateral 10/29/2018   Procedure: UPPER EXTREMITY VENOGRAPHY;  Surgeon: Marty Heck, MD;  Location: [facility];  Service: Cardiovascular;  Laterality: Bilateral;   HPI:  64 year old male with PMH of CABG in 1997 s/p stent, ESRD on HD s/p renal transplant, presented to [facility] 3/29 with CP, SOB. Dx NSTEMI and multivessel CAD. Underwent 3vCABG and AVR 4/1 and  balloon pump placement. Intubated 4/1 for surgery, extubated 4/4.  CRRT.  Assessment / Plan / Recommendation Clinical Impression  Pt participated in very limited clinical swallow evaluation due to fatigue and his concerns that consuming liquids/purees would lead to painful coughing.  HOB was elevated/tilted as high as able due to presence of femoral line.  Oral mechanism exam was normal, with no focal deficits, presence of dentition, oral mucosa in good condition. Pt able to follow commands; voice is clear and of good quality s/p three-day intubation.  Pt consumed ice chips with adequate attention/mastication, brisk swallow response, no s/s of aspiration. RR within range that allows normal synchrony of swallowing/breathing.  Pt declined water or purees, concerned that they would cause him to choke/cough.  Given absence of  factors that would lead to concerns for a significant dysphagia, recommend allowing small sips of water, ice chips, and oral meds crushed and given in applesauce for today.  SLP will f/u next date to assess readiness to advance diet. D/W RN and pt's wife and daughter; sign [facility] at [facility]  SLP Visit Diagnosis: Dysphagia, unspecified (R13.10)    Aspiration Risk    mild   Diet Recommendation   sips of water, ice chips, meds crushed in puree  Medication Administration: Crushed with puree    Other  Recommendations Oral Care Recommendations: Oral care QID   Follow up Recommendations Other (comment)(tba)      Frequency and Duration min 3x week  2 weeks       Prognosis Prognosis for Safe Diet Advancement: Good      Swallow Study   General HPI: 64 year old male with PMH of CABG in 1997 s/p stent, ESRD on HD s/p renal transplant, [facility] 3/29 with CP, SOB. Dx NSTEMI and multivessel CAD. Underwent 3vCABG and AVR 4/1 and balloon pump placement. Intubated 4/1 for surgery, extubated 4/4.  Type of Study: Bedside Swallow Evaluation Previous Swallow Assessment: no Diet Prior to this Study: NPO Temperature Spikes Noted: No Respiratory Status: Nasal cannula History of Recent Intubation: Yes Length of Intubations (days): 3 days Date extubated: 10/30/19 Behavior/Cognition: Alert;Lethargic/Drowsy Oral Cavity Assessment: Within Functional Limits Oral Care Completed by SLP: Recent completion by staff Oral Cavity - Dentition: Adequate natural dentition Self-Feeding Abilities: Needs assist Patient Positioning: Partially reclined Baseline Vocal Quality: Normal Volitional Cough: Other (Comment)(did not elicit) Volitional Swallow: Able to elicit    Oral/Motor/Sensory Function Overall Oral Motor/Sensory Function: Within functional limits   Ice Chips Ice chips: Within functional limits Presentation: Spoon   Thin Liquid Thin Liquid: Not tested(pt declined)    Nectar Thick Nectar  Thick Liquid: Not tested   Honey Thick Honey Thick Liquid: Not tested   Puree Puree: Not tested(pt declined)   Solid    Analis Distler L. Tivis Ringer, MA CCC/SLP Acute Rehabilitation Services Office number [phone] Pager [phone]  Solid: Not tested      Juan Quam Laurice 10/30/2019,1:47 PM

## 2019-10-30 NOTE — Progress Notes (Signed)
      [address]       [place] [address]             [phone]      Sleeping currently  BP 122/62   Pulse 94   Temp 98 F (36.7 C) (Oral)   Resp (!) 37   Ht 5' 6.5" (1.689 m)   Wt 115.5 kg   SpO2 93%   BMI 40.48 kg/m  2L  93% sat Off epi and norepi  Intake/Output Summary (Last 24 hours) at 10/30/2019 1908 Last data filed at 10/30/2019 1900 Gross per 24 hour  Intake 2018.91 ml  Output 4115 ml  Net -2096.09 ml   K= 5.3  Doing well continue current Rx  Nikolaus Pienta C. Roxan Hockey, MD [facility] [phone]

## 2019-10-30 NOTE — Plan of Care (Signed)
  Problem: Education: Goal: Knowledge of General Education information will improve Description: Including pain rating scale, medication(s)/side effects and non-pharmacologic comfort measures Outcome: Progressing   Problem: Health Behavior/Discharge Planning: Goal: Ability to manage health-related needs will improve Outcome: Progressing   Problem: Clinical Measurements: Goal: Ability to maintain clinical measurements within normal limits will improve Outcome: Progressing Goal: Will remain free from infection Outcome: Progressing Goal: Diagnostic test results will improve Outcome: Progressing Goal: Respiratory complications will improve Outcome: Progressing Goal: Cardiovascular complication will be avoided Outcome: Progressing   Problem: Activity: Goal: Risk for activity intolerance will decrease Outcome: Progressing   Problem: Nutrition: Goal: Adequate nutrition will be maintained Outcome: Not Progressing Note: Pt refused to eat today, will reevaluate tomorrow   Problem: Coping: Goal: Level of anxiety will decrease Outcome: Progressing   Problem: Elimination: Goal: Will not experience complications related to bowel motility Outcome: Not Progressing Goal: Will not experience complications related to urinary retention Outcome: Not Applicable   Problem: Pain Managment: Goal: General experience of comfort will improve Outcome: Progressing   Problem: Safety: Goal: Ability to remain free from injury will improve Outcome: Progressing   Problem: Skin Integrity: Goal: Risk for impaired skin integrity will decrease Outcome: Progressing   Problem: Activity: Goal: Risk for activity intolerance will decrease Outcome: Progressing   Problem: Cardiac: Goal: Will achieve and/or maintain hemodynamic stability Outcome: Progressing   Problem: Clinical Measurements: Goal: Postoperative complications will be avoided or minimized Outcome: Progressing   Problem:  Respiratory: Goal: Respiratory status will improve Outcome: Progressing   Problem: Skin Integrity: Goal: Wound healing without signs and symptoms of infection Outcome: Progressing Goal: Risk for impaired skin integrity will decrease Outcome: Progressing   Problem: Urinary Elimination: Goal: Ability to achieve and maintain adequate renal perfusion and functioning will improve Outcome: Not Applicable   Problem: Activity: Goal: Ability to tolerate increased activity will improve Outcome: Completed/Met   Problem: Respiratory: Goal: Ability to maintain a clear airway and adequate ventilation will improve Outcome: Completed/Met   Problem: Role Relationship: Goal: Method of communication will improve Outcome: Completed/Met

## 2019-10-30 NOTE — Progress Notes (Signed)
10cc fentanyl and 30cc precedex wasted in stericycle; witnessed by Julaine Fusi RN

## 2019-10-30 NOTE — Progress Notes (Addendum)
NAME:  Patrick [name], MRN:  [id number], DOB:  [birth year], LOS: 54 ADMISSION DATE:  10/24/2019, CONSULTATION DATE:  10/28/19 REFERRING MD:  Orvan Seen, CHIEF COMPLAINT:  NSTEMI s/p CABG  Brief History   Patrick [name] is a 29 yom with PMH of ESRD on HD s/p renal transplant, CAD s/p CABG (1997) DES (2020) CABG (10/27/19), HFpEF, T2DM, PAF who presented to [facility] on 10/24/19 for progressive chest pain and SOB and was found to have an NSTEMI and multivessel CAD on heart cath. Pre-op workup additionally revealed mod-severe AV insufficiency. CT surgery was consulted and he underwent CABG on 4/1 with AVR and IABP. PCCM asked to consult for vent management    Past Medical History  ESRD on HD s/p renal transplant,  CAD s/p CABG (1997) DES (2020) CABG (10/27/19) HFpEF PAF T2DM  Significant Hospital Events   3/29>admission 3/30>heart cath>multivessel disease 4/1>CABG with IABP placement, AVR. 4U PRBC, 2U FFP transfused 4/3>IABP 1:2. Stable respiratory status on CPAP setting. 4/4>loss of left foot doppler>IABP removed. Weaning pressors>bp stable. Heparin antibodies neg.   Consults:  PCCM  Significant Diagnostic Tests:  3/30 CT chest>>minimal LL atelectasis 3/30 LHC>>ost 1st diagonal lesion 50% stenosed, prox LAD 100% stenosed, Mid RCA 100% stenosed, ost cx to prox cx lesion 90% stenosed, mild elevation in LVEDP 3/31 echo>>LVEF 45-50%; LV global hypokinesis; normal RV systolic pressure and size, normal PA pressure, moderate LA dilation, moderate AV regurg 4/1 CXR>>stable cardiomegaly; mild pulm edema; trace b/l pleural effusions with bibasilar atelectasis 4/2 CXR>>diffuse mild b/l pulm infiltrates/edema 4/3 CXR>>b/l pulm edema Micro Data:  MRSA neg  Antimicrobials:  Cefuroxime 4/1>  Interim history/subjective:  Able to remove ~4600cc with CRRT yesterday.  Bedside RN noted loss of left foot pulse overnight so CTS was called and removed balloon pump. Left foot pulse returned.  Did well on CPAP  throughout the majority of the day yesterday and now this morning.   Objective   Blood pressure 122/62, pulse 94, temperature 98 F (36.7 C), temperature source Oral, resp. rate (!) 25, height 5' 6.5" (1.689 m), weight 115.5 kg, SpO2 97 %.    Vent Mode: PSV;CPAP FiO2 (%):  [40 %] 40 % Set Rate:  [18 bmp] 18 bmp Vt Set:  [510 mL] 510 mL PEEP:  [8 cmH20] 8 cmH20 Pressure Support:  [10 cmH20] 10 cmH20 Plateau Pressure:  [15 cmH20-25 cmH20] 21 cmH20   Intake/Output Summary (Last 24 hours) at 10/30/2019 0859 Last data filed at 10/30/2019 0800 Gross per 24 hour  Intake 3222.54 ml  Output 4959 ml  Net -1736.46 ml   Filed Weights   10/28/19 0500 10/29/19 0500 10/30/19 0530  Weight: 117.7 kg 118.6 kg 115.5 kg    Examination: General: intubated HENT: ETT in place. Periorbital edema resolved Lungs: lung sounds distant. Breathing comfortably on CPAP vent setting 10/8 Cardiovascular: RRR. Intact dressing to midline sterotomy incision. Chest tubes present. Minimal extremity edema.  Abdomen: bs active Extremities: warm Neuro: alert. Following commands. Lifts head and head off bed. GU: foley  Resolved Hospital Problem list   none  Assessment & Plan:   Multivessel CAD s/p repeat CABG 4/1 AV insufficiency s/p AVR 4/1 Combined heart failure.  Cardiogenic shock. Remains on epi, levo and vasopressin. IABP removed overnight and blood pressures are looking good even while coming down on pressors.  Management per CTS and HF teams  Post-op respiratory insufficiency. Did well on CPAP throughout the majority of yesterday and now this morning. Extubated this morning and is doing well  on 4L [place].  Acute encephalopathy 2/2 post-op crit illness. Sedation off. Following commands and responding appropriately.   Post op blood loss anemia. hgb down 11.6>10.5 from yesterday but remaining stable. Thrombocytopenia. Slight improvement in platelets from 28>31. Will discuss transitioning to heparin with  CTS. Transfuse for hgb <8 and platelet <10.  ESRD on HD. Able to pull 4600cc off with CRRT yesterday. Still significantly volume up but net negative 600 yesterday so I fell like this is heading in the right direction as his pressures are tolerating this well.  T2DM. Glucoses stable. Continue insulin gtt. Can consider transitioning to subcut insulin  Best practice:  Diet: tube feeds started 4/2 Pain/Anxiety/Delirium protocol (if indicated): fentanyl prn VAP protocol (if indicated): yes DVT prophylaxis: SCD GI prophylaxis: colace, pepcid, zofran, protonix Glucose control: insulin drip Mobility: BR Code Status: full Family Communication: family updated last afternoon; per primary Disposition: ICU  Labs   CBC: Recent Labs  Lab 10/24/19 0737 10/25/19 0459 10/27/19 1731 10/27/19 2011 10/28/19 0315 10/28/19 0315 10/28/19 8841 10/28/19 1611 10/28/19 1825 10/29/19 0410 10/30/19 0506  WBC 9.3   < > 13.4*  --  16.8*  --   --  15.9*  --  14.9* 13.7*  NEUTROABS 7.3  --   --   --   --   --   --   --   --   --   --   HGB 8.6*   < > 10.4*   < > 12.8*   < > 10.9* 12.2* 11.6* 11.6* 10.5*  HCT 27.7*   < > 31.6*   < > 37.5*   < > 32.0* 35.2* 34.0* 34.8* 32.0*  MCV 101.1*   < > 94.0  --  88.4  --   --  88.0  --  89.5 93.3  PLT 150   < > 93*  --  48*  --   --  29*  --  28* 31*   < > = values in this interval not displayed.    Basic Metabolic Panel: Recent Labs  Lab 10/25/19 0459 10/26/19 0802 10/28/19 0315 10/28/19 6606 10/28/19 1015 10/28/19 1015 10/28/19 1611 10/28/19 1825 10/29/19 0410 10/29/19 1558 10/30/19 0506  NA 138   < > 144   < > 145   < > 143 142 139 138 140  K 4.5   < > 3.0*   < > 3.7   < > 4.4 4.6 4.8 4.9 4.6  CL 99   < > 106   < > 109  --  106  --  104 105 105  CO2 21*   < > 21*   < > 19*  --  20*  --  22 23 23   GLUCOSE 177*   < > 148*   < > 130*  --  126*  --  149* 183* 150*  BUN 34*   < > 38*   < > 35*  --  31*  --  27* 21 21  CREATININE 10.11*   < > 7.21*   <  > 6.49*  --  5.73*  --  4.48* 3.61* 2.94*  CALCIUM 8.6*   < > 6.9*   < > 6.8*  --  7.0*  --  7.2* 7.2* 7.5*  MG  --   --  1.9  --   --   --  2.0  --  2.2 2.3 2.4  PHOS 3.1  --   --   --   --   --  4.6  4.5  --  3.7 2.9 2.4*   < > = values in this interval not displayed.   GFR: Estimated Creatinine Clearance: 30.2 mL/min (A) (by C-G formula based on SCr of 2.94 mg/dL (H)). Recent Labs  Lab 10/28/19 0315 10/28/19 1611 10/29/19 0410 10/30/19 0506  WBC 16.8* 15.9* 14.9* 13.7*    Liver Function Tests: Recent Labs  Lab 10/27/19 0451 10/27/19 0451 10/28/19 1611 10/28/19 2114 10/29/19 0410 10/29/19 1558 10/30/19 0506  AST 97*  --   --  74*  --   --   --   ALT 46*  --   --  27  --   --   --   ALKPHOS 77  --   --  36*  --   --   --   BILITOT 1.1  --   --  0.8  --   --   --   PROT 7.3  --   --  4.8*  --   --   --   ALBUMIN 3.6   < > 3.0* 2.9* 3.0* 2.6* 2.6*   < > = values in this interval not displayed.   No results for input(s): LIPASE, AMYLASE in the last 168 hours. No results for input(s): AMMONIA in the last 168 hours.  ABG    Component Value Date/Time   PHART 7.306 (L) 10/28/2019 1825   PCO2ART 45.1 10/28/2019 1825   PO2ART 77.0 (L) 10/28/2019 1825   HCO3 22.6 10/28/2019 1825   TCO2 24 10/28/2019 1825   ACIDBASEDEF 4.0 (H) 10/28/2019 1825   O2SAT 94.0 10/28/2019 1825     Coagulation Profile: Recent Labs  Lab 10/27/19 0451 10/27/19 1827 10/28/19 0315  INR 1.4* 2.3* 1.4*    Cardiac Enzymes: No results for input(s): CKTOTAL, CKMB, CKMBINDEX, TROPONINI in the last 168 hours.  HbA1C: Hemoglobin A1C  Date/Time Value Ref Range Status  06/07/2019 10:54 AM 6.4 (A) 4.0 - 5.6 % Final   Hgb A1c MFr Bld  Date/Time Value Ref Range Status  10/27/2019 04:51 AM 6.2 (H) 4.8 - 5.6 % Final    Comment:    (NOTE) Pre diabetes:          5.7%-6.4% Diabetes:              >6.4% Glycemic control for   <7.0% adults with diabetes   08/20/2018 07:23 AM 6.9 (H) 4.8 - 5.6 %  Final    Comment:    (NOTE) Pre diabetes:          5.7%-6.4% Diabetes:              >6.4% Glycemic control for   <7.0% adults with diabetes     CBG: Recent Labs  Lab 10/30/19 0014 10/30/19 0210 10/30/19 0438 10/30/19 0630 10/30/19 0833  GLUCAP 123* 136* 136* 111* 91    Past Medical History  He,  has a past medical history of Arthritis, Atrial flutter ([place]), Coronary artery disease, Erectile dysfunction, ESRD (end stage renal disease) on dialysis ([place]), Essential hypertension, Gout, Hernia of abdominal wall, History of blood transfusion, History of cardiomyopathy, History of kidney stones, History of pneumonia, Migraine, Myocardial infarction ([place]), Pneumonia, Secondary hyperparathyroidism ([place]), Type 2 diabetes mellitus ([place]), and Wears glasses.   Surgical History    Past Surgical History:  Procedure Laterality Date  . A-FLUTTER ABLATION N/A 09/24/2016   Procedure: A-Flutter Ablation;  Surgeon: Will Meredith Leeds, MD;  Location: [facility];  Service: Cardiovascular;  Laterality: N/A;  . ABDOMINAL  AORTOGRAM W/LOWER EXTREMITY N/A 04/13/2017   Procedure: ABDOMINAL AORTOGRAM W/LOWER EXTREMITY;  Surgeon: Conrad Irene, MD;  Location: [place] [facility];  Service: Cardiovascular;  Laterality: N/A;  Bilater lower extermity  . AORTIC VALVE REPLACEMENT N/A 10/27/2019   Procedure: AORTIC VALVE REPLACEMENT (AVR) USING EDWARDS INTUITY 23 MM AORTIC VALVE.;  Surgeon: Wonda Olds, MD;  Location: [facility];  Service: Open Heart Surgery;  Laterality: N/A;  . APPENDECTOMY    . AV FISTULA PLACEMENT Right 09/18/2014   Procedure: INSERTION OF ARTERIOVENOUS (AV) GORE-TEX GRAFT ARM USING 4-7MM  X 45CM STRETCH GORE-TEX VASCULAR GRAFT;  Surgeon: Rosetta Posner, MD;  Location: [facility];  Service: Vascular;  Laterality: Right;  . AV FISTULA PLACEMENT Left 07/07/2016   Procedure: INSERTION OF LEFT BRACHIAL TO AXILLARY ARTERIOVENOUS (AV) GORE-TEX ARM GRAFT;  Surgeon: Conrad Hosston, MD;  Location: [place];   Service: Vascular;  Laterality: Left;  . AV FISTULA PLACEMENT Left 12/09/2018   Procedure: Insertion Of Arteriovenous (Av) Gore-Tex Graft Arm, left arm;  Surgeon: Marty Heck, MD;  Location: [facility];  Service: Vascular;  Laterality: Left;  . CARDIAC CATHETERIZATION  ~ 2016  . COLONOSCOPY    . 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 CORONARY ARTERY BYPASS GRAFT N/A 10/27/2019   Procedure: REDO CORONARY ARTERY BYPASS GRAFTING TIMES 3  USING LEFT GREATER SAPHENOUS LEG VEIN HARVESTED ENDOSCOPICALLY(CABG);  Surgeon: Wonda Olds, MD;  Location: [place];  Service: Open Heart Surgery;  Laterality: N/A;  . CORONARY ATHERECTOMY N/A 02/16/2019   Procedure: CORONARY ATHERECTOMY;  Surgeon: Martinique, Peter M, MD;  Location: [place] [facility];  Service: Cardiovascular;  Laterality: N/A;  . ENDOVEIN HARVEST OF GREATER SAPHENOUS VEIN Left 10/27/2019   Procedure: [place] Of Greater Saphenous Vein;  Surgeon: Wonda Olds, MD;  Location: [place];  Service: Open Heart Surgery;  Laterality: Left;  . EXCHANGE OF A DIALYSIS CATHETER Left 01/11/2018   Procedure: EXCHANGE OF TUNNELED DIALYSIS CATHETER;  Surgeon: Rosetta Posner, MD;  Location: [place];  Service: Vascular;  Laterality: Left;  . HERNIA REPAIR  2017   with nephrectomy  . INSERTION OF DIALYSIS CATHETER N/A 10/08/2017   Procedure: INSERTION OF TUNNELED DIALYSIS CATHETER;  Surgeon: Conrad Almira, MD;  Location: [facility];  Service: Vascular;  Laterality: N/A;  . IR FLUORO GUIDE CV LINE LEFT  03/12/2018  . IR GENERIC HISTORICAL  05/11/2016   IR FLUORO GUIDE CV LINE LEFT 05/11/2016 Corrie Mckusick, DO [facility]  . IR GENERIC HISTORICAL  05/11/2016   IR US GUIDE VASC ACCESS LEFT 05/11/2016 Corrie Mckusick, DO [facility]  . IR GENERIC HISTORICAL  05/11/2016   IR US GUIDE VASC ACCESS RIGHT 05/11/2016 Corrie Mckusick, DO [facility]  . IR GENERIC HISTORICAL  05/11/2016   IR  RADIOLOGY PERIPHERAL GUIDED IV START 05/11/2016 Corrie Mckusick, DO [facility]  . KIDNEY TRANSPLANT  2009  . LEFT HEART CATH AND CORONARY ANGIOGRAPHY N/A 02/15/2019   Procedure: LEFT HEART CATH AND CORONARY ANGIOGRAPHY;  Surgeon: Martinique, Peter M, MD;  Location: [place] [facility];  Service: Cardiovascular;  Laterality: N/A;  . LEFT HEART CATH AND CORS/GRAFTS ANGIOGRAPHY N/A 12/04/2017   Procedure: LEFT HEART CATH AND CORS/GRAFTS ANGIOGRAPHY;  Surgeon: Belva Crome, MD;  Location: [place] [facility];  Service: Cardiovascular;  Laterality: N/A;  . LEFT HEART CATH AND CORS/GRAFTS ANGIOGRAPHY N/A 10/25/2019   Procedure: LEFT  HEART CATH AND CORS/GRAFTS ANGIOGRAPHY;  Surgeon: Martinique, Peter M, MD;  Location: [facility];  Service: Cardiovascular;  Laterality: N/A;  . NEPHRECTOMY  2017   transplant rejected   . PERIPHERAL VASCULAR CATHETERIZATION N/A 06/04/2016   Procedure: Upper Extremity Venography;  Surgeon: Waynetta Sandy, MD;  Location: [facility];  Service: Cardiovascular;  Laterality: N/A;  . PERIPHERAL VASCULAR INTERVENTION  04/13/2017   Procedure: PERIPHERAL VASCULAR INTERVENTION;  Surgeon: Conrad [place]view, MD;  Location: [facility];  Service: Cardiovascular;;  Lt. Common/Exernal  Iliac  . TEE WITHOUT CARDIOVERSION N/A 10/27/2019   Procedure: TRANSESOPHAGEAL ECHOCARDIOGRAM (TEE);  Surgeon: Wonda Olds, MD;  Location: [facility];  Service: Open Heart Surgery;  Laterality: N/A;  . THROMBECTOMY AND REVISION OF ARTERIOVENTOUS (AV) GORETEX  GRAFT Left 10/08/2017   Procedure: THROMBECTOMY of ARTERIOVENTOUS (AV) GORETEX  GRAFT LEFT UPPER ARM;  Surgeon: Conrad [place] Pole, MD;  Location: [facility];  Service: Vascular;  Laterality: Left;  . THROMBECTOMY W/ EMBOLECTOMY Left 09/14/2017   Procedure: THROMBECTOMY ARTERIOVENOUS GORE-TEX GRAFT LEFT UPPER ARM;  Surgeon: Rosetta Posner, MD;  Location: [facility];  Service: Vascular;  Laterality: Left;  . UPPER EXTREMITY ANGIOGRAPHY Bilateral 10/29/2018     Procedure: UPPER EXTREMITY ANGIOGRAPHY;  Surgeon: Marty Heck, MD;  Location: [facility];  Service: Cardiovascular;  Laterality: Bilateral;  . UPPER EXTREMITY VENOGRAPHY N/A 11/16/2017   Procedure: UPPER EXTREMITY VENOGRAPHY - Right Arm;  Surgeon: Conrad , MD;  Location: [facility];  Service: Cardiovascular;  Laterality: N/A;  . UPPER EXTREMITY VENOGRAPHY Bilateral 10/29/2018   Procedure: UPPER EXTREMITY VENOGRAPHY;  Surgeon: Marty Heck, MD;  Location: [facility];  Service: Cardiovascular;  Laterality: Bilateral;     Social History   reports that he quit smoking about 5 years ago. His smoking use included cigarettes. He has never used smokeless tobacco. He reports current alcohol use. He reports that he does not use drugs.   Family History   His family history includes HIV in his sister; Hyperlipidemia in his mother; Hypertension in his mother.   Allergies Allergies  Allergen Reactions  . Baclofen Other (See Comments)    Possibly stroke like symptoms  . Iodinated Diagnostic Agents Swelling, Rash and Other (See Comments)    Other Reaction: burning to mouth, swelling of lips  . Lipitor [Atorvastatin] Other (See Comments)    Leg pain  . Metoprolol Other (See Comments)    Headaches      Home Medications  Prior to Admission medications   Medication Sig Start Date End Date Taking? Authorizing Provider  acetaminophen (TYLENOL) 500 MG tablet Take 1,000 mg by mouth every 6 (six) hours as needed for moderate pain or headache.   Yes [provider]  albuterol (PROVENTIL HFA;VENTOLIN HFA) 108 (90 Base) MCG/ACT inhaler Inhale 1-2 puffs into the lungs every 6 (six) hours as needed for wheezing or shortness of breath. 12/18/17  Yes Richardson Dopp T, PA-C  aspirin EC 81 MG tablet Take 81 mg by mouth daily.    Yes [provider]  calcium acetate (PHOSLO) 667 MG capsule Take 1,334-2,001 mg by mouth See admin instructions. 3 capsules with  dinner and 2 capsules with snacks   Yes [provider]  calcium carbonate (OS-CAL - DOSED IN MG OF ELEMENTAL CALCIUM) 1250 (500 Ca) MG tablet Take 2 tablets by mouth daily.  11/29/13  Yes [provider]  clopidogrel (PLAVIX) 75 MG tablet TAKE  1 TABLET(75 MG) BY MOUTH DAILY Patient taking differently: Take 75 mg by mouth daily.  03/03/19  Yes Richardson Dopp T, PA-C  Colchicine 0.6 MG CAPS Take 0.6 mg by mouth See admin instructions. Take 0.6 mg when first symptoms of gout flare occur, then take another 0.6 mg 14 days later as needed for gout 10/13/18  Yes [provider]  dextromethorphan (DELSYM) 30 MG/5ML liquid Take 30 mg by mouth as needed for cough.   Yes [provider]  Dextromethorphan-guaiFENesin 5-100 MG/5ML LIQD Take 15 mLs by mouth 2 (two) times daily as needed (Cough).  06/03/18  Yes [provider]  docusate sodium (COLACE) 100 MG capsule Take 100 mg by mouth daily as needed for mild constipation.   Yes [provider]  ezetimibe (ZETIA) 10 MG tablet TAKE 1 TABLET(10 MG) BY MOUTH DAILY Patient taking differently: Take 10 mg by mouth daily.  05/09/19  Yes Weaver, [place] T, PA-C  isosorbide mononitrate (IMDUR) 30 MG 24 hr tablet Take 1 tablet (30 mg total) by mouth daily. 02/24/19  Yes Daune Perch, NP  lidocaine-prilocaine (EMLA) cream Apply 1 application topically daily as needed (Numbing).  03/21/19  Yes [provider]  metoprolol succinate (TOPROL XL) 25 MG 24 hr tablet Take 1 tablet (25 mg total) by mouth daily. 05/05/19  Yes Daune Perch, NP  multivitamin (RENA-VIT) TABS tablet Take 1 tablet by mouth daily.    Yes [provider]  neomycin-bacitracin-polymyxin (NEOSPORIN) ointment Apply 1 application topically 2 (two) times daily. Apply to feet.   Yes [provider]  nitroGLYCERIN (NITROSTAT) 0.4 MG SL tablet Place 1 tablet (0.4 mg total) under the tongue every 5 (five) minutes as needed for chest pain.  09/16/19 12/15/19 Yes Fay Records, MD  repaglinide (PRANDIN) 0.5 MG tablet Take 1 tablet (0.5 mg total) by mouth 2 (two) times daily before a meal. 08/23/19  Yes Renato Shin, MD  rosuvastatin (CRESTOR) 10 MG tablet Take 1 tablet (10 mg total) by mouth daily. 02/24/19  Yes Daune Perch, NP  silver sulfADIAZINE (SILVADENE) 1 % cream Apply 1 application topically daily. Patient taking differently: Apply 1 application topically 2 (two) times daily.  10/18/19  Yes Trula Slade, DPM  traMADol (ULTRAM) 50 MG tablet Take 50 mg by mouth 2 (two) times daily as needed for moderate pain.  05/19/18  Yes [provider]  doxycycline (VIBRA-TABS) 100 MG tablet Take 1 tablet (100 mg total) by mouth 2 (two) times daily. Patient not taking: Reported on 10/24/2019 09/06/19   Trula Slade, DPM  Methoxy PEG-Epoetin Beta (MIRCERA IJ) Mircera 09/05/19 09/03/20  [provider]  oxyCODONE-acetaminophen (PERCOCET/ROXICET) 5-325 MG tablet Take 1 tablet by mouth every 6 (six) hours as needed. Patient not taking: Reported on 10/24/2019 12/09/18   Ulyses Amor, PA-C  pantoprazole (PROTONIX) 40 MG tablet Take 1 tablet (40 mg total) by mouth daily. Patient not taking: Reported on 10/24/2019 11/08/18   Leanor Kail, PA     Mitzi Hansen, MD INTERNAL MEDICINE RESIDENT PGY-1 10/30/19  8:59 AM

## 2019-10-30 NOTE — Progress Notes (Signed)
OT Cancellation Note  Patient Details Name: Patrick [name] MRN: [id number] DOB: 12/25/1955   Cancelled Treatment:    Reason Eval/Treat Not Completed: Other (comment) Pt on CRRT and has a femoral line. OT will hold until pt appropriate for mobility.  Zenovia Jarred, MSOT, OTR/L Acute Rehabilitation Services [facility] Office Number: [phone] Pager: [phone]  Zenovia Jarred 10/30/2019, 2:38 PM

## 2019-10-30 NOTE — Progress Notes (Signed)
PT Cancellation Note  Patient Details Name: Patrick [name] MRN: [id number] DOB: 1955/12/10   Cancelled Treatment:    Reason Eval/Treat Not Completed: Patient not medically ready. Pt on CRRT and has a femoral line. PT to return as able, as appropriate to progress mobility.  [name], PT, DPT Acute Rehabilitation Services Pager #: [phone] Office #: [phone]    [name] 10/30/2019, 12:38 PM

## 2019-10-30 NOTE — Progress Notes (Addendum)
2350 Dr. Orvan Seen here to assess patient, no pulses left foot  2355 Dr. Orvan Seen removed IABP with manual pressure, Argatroban gtt on hold  0010 Femostop to be placed per verbal order, assisted Dr. Orvan Seen placing Femostop, lft groin soft/no bleeding, verbal order received to remove Rt Subclavian Introducer  0115 Pharmacy aware of plan to hold Argatroban x 1hour  0125 update to wife via phone  0115 Femostop removed, site soft without bleeding, pressure dressing applied, still no pulses in left foot  0130 Argatroban restarted

## 2019-10-30 NOTE — Procedures (Signed)
Extubation Procedure Note  Patient Details:   Name: Patrick [name] DOB: 04/26/1956 MRN: [id number]   Airway Documentation:    Vent end date: 10/30/19 Vent end time: 0815   Evaluation  O2 sats: stable throughout Complications: No apparent complications Patient did tolerate procedure well. Bilateral Breath Sounds: Diminished, Clear   Yes   Pt was extubated per MD and placed on 4 L [place]. Cuff leak was noted prior to extubation and no stridor post extubation. Pt is stable at this time. RT will monitor.   Ronaldo Miyamoto 10/30/2019, 8:19 AM

## 2019-10-30 NOTE — Plan of Care (Signed)
  Problem: Education: Goal: Ability to demonstrate management of disease process will improve Outcome: Progressing Goal: Ability to verbalize understanding of medication therapies will improve Outcome: Progressing Goal: Individualized Educational Video(s) Outcome: Progressing   Problem: Activity: Goal: Capacity to carry out activities will improve Outcome: Progressing   Problem: Cardiac: Goal: Ability to achieve and maintain adequate cardiopulmonary perfusion will improve Outcome: Progressing   Problem: Education: Goal: Knowledge of General Education information will improve Description: Including pain rating scale, medication(s)/side effects and non-pharmacologic comfort measures Outcome: Progressing   Problem: Health Behavior/Discharge Planning: Goal: Ability to manage health-related needs will improve Outcome: Progressing   Problem: Clinical Measurements: Goal: Ability to maintain clinical measurements within normal limits will improve Outcome: Progressing Goal: Will remain free from infection Outcome: Progressing Goal: Diagnostic test results will improve Outcome: Progressing Goal: Respiratory complications will improve Outcome: Progressing Goal: Cardiovascular complication will be avoided Outcome: Progressing   Problem: Activity: Goal: Risk for activity intolerance will decrease Outcome: Progressing   Problem: Nutrition: Goal: Adequate nutrition will be maintained Outcome: Progressing   Problem: Coping: Goal: Level of anxiety will decrease Outcome: Progressing   Problem: Elimination: Goal: Will not experience complications related to bowel motility Outcome: Progressing Goal: Will not experience complications related to urinary retention Outcome: Progressing   Problem: Pain Managment: Goal: General experience of comfort will improve Outcome: Progressing   Problem: Safety: Goal: Ability to remain free from injury will improve Outcome: Progressing    Problem: Skin Integrity: Goal: Risk for impaired skin integrity will decrease Outcome: Progressing   Problem: Education: Goal: Will demonstrate proper wound care and an understanding of methods to prevent future damage Outcome: Progressing Goal: Knowledge of disease or condition will improve Outcome: Progressing Goal: Knowledge of the prescribed therapeutic regimen will improve Outcome: Progressing Goal: Individualized Educational Video(s) Outcome: Progressing   Problem: Activity: Goal: Risk for activity intolerance will decrease Outcome: Progressing   Problem: Cardiac: Goal: Will achieve and/or maintain hemodynamic stability Outcome: Progressing   Problem: Clinical Measurements: Goal: Postoperative complications will be avoided or minimized Outcome: Progressing   Problem: Respiratory: Goal: Respiratory status will improve Outcome: Progressing   Problem: Skin Integrity: Goal: Wound healing without signs and symptoms of infection Outcome: Progressing Goal: Risk for impaired skin integrity will decrease Outcome: Progressing   Problem: Urinary Elimination: Goal: Ability to achieve and maintain adequate renal perfusion and functioning will improve Outcome: Progressing   Problem: Activity: Goal: Ability to tolerate increased activity will improve Outcome: Progressing   Problem: Respiratory: Goal: Ability to maintain a clear airway and adequate ventilation will improve Outcome: Progressing   Problem: Role Relationship: Goal: Method of communication will improve Outcome: Progressing

## 2019-10-30 NOTE — Progress Notes (Signed)
Pulmonary Interval Note  Patient evaluated post-extubation. Tolerating nasal cannula. PCCM will sign off. Please call [phone] pager for re-consult if needed.  Rodman Pickle, M.D. [facility] Pulmonary/Critical Care Medicine 10/30/2019 4:07 PM

## 2019-10-31 ENCOUNTER — Inpatient Hospital Stay (HOSPITAL_COMMUNITY): Payer: Medicare HMO

## 2019-10-31 DIAGNOSIS — I2510 Atherosclerotic heart disease of native coronary artery without angina pectoris: Secondary | ICD-10-CM | POA: Diagnosis not present

## 2019-10-31 DIAGNOSIS — I472 Ventricular tachycardia: Secondary | ICD-10-CM | POA: Diagnosis not present

## 2019-10-31 HISTORY — PX: IR US GUIDE VASC ACCESS RIGHT: IMG2390

## 2019-10-31 HISTORY — PX: IR FLUORO GUIDE CV LINE RIGHT: IMG2283

## 2019-10-31 HISTORY — PX: IR PTA VENOUS EXCEPT DIALYSIS CIRCUIT: IMG6126

## 2019-10-31 LAB — BPAM RBC
Blood Product Expiration Date: 202104212359
Blood Product Expiration Date: 202105062359
Blood Product Expiration Date: 202105072359
Blood Product Expiration Date: 202105072359
Unit Type and Rh: 9500
Unit Type and Rh: 9500
Unit Type and Rh: 9500
Unit Type and Rh: 9500

## 2019-10-31 LAB — RENAL FUNCTION PANEL
Albumin: 2.6 g/dL — ABNORMAL LOW (ref 3.5–5.0)
Albumin: 2.3 g/dL — ABNORMAL LOW (ref 3.5–5.0)
Anion gap: 12 (ref 5–15)
Anion gap: 11 (ref 5–15)
BUN: 20 mg/dL (ref 8–23)
BUN: 23 mg/dL (ref 8–23)
CO2: 24 mmol/L (ref 22–32)
CO2: 21 mmol/L — ABNORMAL LOW (ref 22–32)
Calcium: 7.5 mg/dL — ABNORMAL LOW (ref 8.9–10.3)
Calcium: 7 mg/dL — ABNORMAL LOW (ref 8.9–10.3)
Chloride: 102 mmol/L (ref 98–111)
Chloride: 104 mmol/L (ref 98–111)
Creatinine, Ser: 2.61 mg/dL — ABNORMAL HIGH (ref 0.61–1.24)
Creatinine, Ser: 2.86 mg/dL — ABNORMAL HIGH (ref 0.61–1.24)
GFR calc Af Amer: 29 mL/min — ABNORMAL LOW (ref >60)
GFR calc Af Amer: 26 mL/min — ABNORMAL LOW (ref >60)
GFR calc non Af Amer: 25 mL/min — ABNORMAL LOW (ref >60)
GFR calc non Af Amer: 22 mL/min — ABNORMAL LOW (ref >60)
Glucose, Bld: 146 mg/dL — ABNORMAL HIGH (ref 70–99)
Glucose, Bld: 135 mg/dL — ABNORMAL HIGH (ref 70–99)
Phosphorus: 1.9 mg/dL — ABNORMAL LOW (ref 2.5–4.6)
Phosphorus: 1.6 mg/dL — ABNORMAL LOW (ref 2.5–4.6)
Potassium: 4.9 mmol/L (ref 3.5–5.1)
Potassium: 4.1 mmol/L (ref 3.5–5.1)
Sodium: 138 mmol/L (ref 135–145)
Sodium: 136 mmol/L (ref 135–145)

## 2019-10-31 LAB — GLUCOSE, CAPILLARY
Glucose-Capillary: 110 mg/dL — ABNORMAL HIGH (ref 70–99)
Glucose-Capillary: 113 mg/dL — ABNORMAL HIGH (ref 70–99)
Glucose-Capillary: 138 mg/dL — ABNORMAL HIGH (ref 70–99)
Glucose-Capillary: 63 mg/dL — ABNORMAL LOW (ref 70–99)
Glucose-Capillary: 78 mg/dL (ref 70–99)
Glucose-Capillary: 82 mg/dL (ref 70–99)

## 2019-10-31 LAB — CBC
HCT: 30.2 % — ABNORMAL LOW (ref 39.0–52.0)
Hemoglobin: 9.6 g/dL — ABNORMAL LOW (ref 13.0–17.0)
MCH: 30.9 pg (ref 26.0–34.0)
MCHC: 31.8 g/dL (ref 30.0–36.0)
MCV: 97.1 fL (ref 80.0–100.0)
Platelets: 30 10*3/uL — ABNORMAL LOW (ref 150–400)
RBC: 3.11 MIL/uL — ABNORMAL LOW (ref 4.22–5.81)
RDW: 17.8 % — ABNORMAL HIGH (ref 11.5–15.5)
WBC: 9.8 10*3/uL (ref 4.0–10.5)
nRBC: 1.9 % — ABNORMAL HIGH (ref 0.0–0.2)

## 2019-10-31 LAB — TYPE AND SCREEN
ABO/RH(D): O POS
Antibody Screen: POSITIVE
Unit division: 0
Unit division: 0
Unit division: 0
Unit division: 0

## 2019-10-31 LAB — MAGNESIUM: Magnesium: 2.6 mg/dL — ABNORMAL HIGH (ref 1.7–2.4)

## 2019-10-31 LAB — SURGICAL PATHOLOGY

## 2019-10-31 MED ORDER — DEXTROSE 50 % IV SOLN
12.5000 g | INTRAVENOUS | Status: AC
  Administered 2019-10-31: 16:00:00 12.5 g via INTRAVENOUS

## 2019-10-31 MED ORDER — DEXTROSE 50 % IV SOLN: 1.0000 | Freq: Once | INTRAVENOUS | Status: AC

## 2019-10-31 MED ORDER — VITAL 1.5 CAL PO LIQD
1000.0000 mL | ORAL | Status: DC
  Administered 2019-10-31: 1000 mL
  Filled 2019-10-31 (×7): qty 1000

## 2019-10-31 MED ORDER — ASPIRIN EC 81 MG PO TBEC
81.0000 mg | DELAYED_RELEASE_TABLET | Freq: Every day | ORAL | Status: DC
  Administered 2019-10-31: 81 mg via ORAL
  Filled 2019-10-31: qty 1

## 2019-10-31 MED ORDER — PRO-STAT SUGAR FREE PO LIQD
30.0000 mL | Freq: Two times a day (BID) | ORAL | Status: DC
  Administered 2019-10-31 – 2019-11-02 (×6): 30 mL
  Filled 2019-10-31 (×6): qty 30

## 2019-10-31 MED ORDER — CEFAZOLIN SODIUM-DEXTROSE 2-4 GM/100ML-% IV SOLN
2.0000 g | INTRAVENOUS | Status: AC
  Filled 2019-10-31: qty 100

## 2019-10-31 MED ORDER — CLOPIDOGREL BISULFATE 75 MG PO TABS
75.0000 mg | ORAL_TABLET | Freq: Every day | ORAL | Status: DC
  Administered 2019-10-31: 75 mg via ORAL
  Filled 2019-10-31: qty 1

## 2019-10-31 MED ORDER — LIDOCAINE HCL 1 % IJ SOLN
INTRAMUSCULAR | Status: AC
  Filled 2019-10-31: qty 20

## 2019-10-31 MED ORDER — CHLORHEXIDINE GLUCONATE 4 % EX LIQD
CUTANEOUS | Status: AC
  Filled 2019-10-31: qty 15

## 2019-10-31 MED FILL — Heparin Sodium (Porcine) Inj 1000 Unit/ML: INTRAMUSCULAR | Qty: 10 | Status: AC

## 2019-10-31 MED FILL — Potassium Chloride Inj 2 mEq/ML: INTRAVENOUS | Qty: 40 | Status: AC

## 2019-10-31 MED FILL — Calcium Chloride Inj 10%: INTRAVENOUS | Qty: 10 | Status: AC

## 2019-10-31 MED FILL — Heparin Sodium (Porcine) Inj 1000 Unit/ML: INTRAMUSCULAR | Qty: 30 | Status: AC

## 2019-10-31 MED FILL — Electrolyte-R (PH 7.4) Solution: INTRAVENOUS | Qty: 9000 | Status: AC

## 2019-10-31 MED FILL — Sodium Bicarbonate IV Soln 8.4%: INTRAVENOUS | Qty: 50 | Status: AC

## 2019-10-31 MED FILL — Magnesium Sulfate Inj 50%: INTRAMUSCULAR | Qty: 10 | Status: AC

## 2019-10-31 MED FILL — Sodium Chloride IV Soln 0.9%: INTRAVENOUS | Qty: 4000 | Status: AC

## 2019-10-31 MED FILL — Albumin, Human Inj 5%: INTRAVENOUS | Qty: 500 | Status: AC

## 2019-10-31 NOTE — Progress Notes (Signed)
[facility] Progress Note  Subjective: pressors off this afternoon.  -2.5 L net yest.  Was down in IR this am getting IV access.  No chg in mild SOB now.   Vitals:   10/31/19 1230 10/31/19 1300 10/31/19 1400 10/31/19 1516  BP:      Pulse:      Resp: (!) 21 18 (!) 26   Temp:   98.6 F (37 C) 98.6 F (37 C)  TempSrc:    Oral  SpO2:      Weight:      Height:        Exam:  gen responsive, alert, not in distress  facial edema   Chest - coarse BS and some crackles at bases    Cor no Rub or gallop    Abd soft obese    Ext 2+ edema UE's > LE's    Neuro awake, responsive    L IJ TDC , LUA AVG not in use/ clotted    Dialysis: [name] MWF   4h  99.5  2K/3.5Ca  P4  TDC   Hep 3000  - mircera 75 last 3/22  - hect 1   Assessment/ Plan: 1. CP/ ACS/ prior CABG '97 - sp CABG on 4/1 2. Cardiogenic shock - off of pressors today 3. ESRD - usual HD MWF, on CRRT here post-op.  4. Volume - vol excess, tolerating UF now and pressors off, will ^ UF 100- 150 cc/hr. Get vol down further overnight 5. Resp - stable on [place] O2 6. Anemia abl/ ckd - tsat 16%, ferr 1293.  Last esa was OP on 3/22, next dose due around 4/5 but  Hb > 11, no esa for now.  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   Inpatient medications: . acetaminophen  1,000 mg Oral Q6H   Or  . acetaminophen (TYLENOL) oral liquid 160 mg/5 mL  1,000 mg Per Tube Q6H  . aspirin EC  81 mg Oral Daily  . bisacodyl  10 mg Oral Daily   Or  . bisacodyl  10 mg Rectal Daily  . chlorhexidine      . chlorhexidine  15 mL Mouth Rinse BID  . Chlorhexidine Gluconate Cloth  6 each Topical Daily  .  clopidogrel  75 mg Oral Daily  . dextrose  1 ampule Intravenous Once  . docusate sodium  200 mg Oral Daily  . feeding supplement (PRO-STAT SUGAR FREE 64)  30 mL Per Tube BID  . insulin aspart  0-9 Units Subcutaneous Q4H  . lidocaine      . mouth rinse  15 mL Mouth Rinse q12n4p  . metoprolol tartrate  12.5 mg Oral BID   Or  . metoprolol tartrate  12.5 mg Per Tube BID  . multivitamin  1 tablet Oral QHS  . pantoprazole  40 mg Oral Daily  . rosuvastatin  10 mg Oral q1800  . sodium chloride flush  10-40 mL Intracatheter Q12H  .  sodium chloride flush  3 mL Intravenous Q12H   .  prismasol BGK 4/2.5 400 mL/hr at 10/31/19 1420  .  prismasol BGK 4/2.5 200 mL/hr at 10/31/19 1420  . sodium chloride 10 mL/hr at 10/31/19 1400  . sodium chloride Stopped (10/30/19 1924)  .  ceFAZolin (ANCEF) IV    . epinephrine Stopped (10/30/19 1702)  . feeding supplement (VITAL 1.5 CAL) 1,000 mL (10/31/19 1631)  . nitroGLYCERIN Stopped (10/27/19 1722)  . prismasol BGK 4/2.5 1,800 mL/hr at 10/31/19 0934  . vasopressin (PITRESSIN) infusion - *FOR SHOCK* 0.02 Units/min (10/31/19 1000)   sodium chloride, sodium chloride, dextrose, heparin, levalbuterol, metoprolol tartrate, morphine injection, ondansetron (ZOFRAN) IV, oxyCODONE, sodium chloride flush, sodium chloride flush, traMADol

## 2019-10-31 NOTE — Progress Notes (Signed)
4 Days Post-Op Procedure(s) (LRB): REDO CORONARY ARTERY BYPASS GRAFTING TIMES 3  USING LEFT GREATER SAPHENOUS LEG VEIN HARVESTED ENDOSCOPICALLY(CABG) (N/A) TRANSESOPHAGEAL ECHOCARDIOGRAM (TEE) (N/A) AORTIC VALVE REPLACEMENT (AVR) USING EDWARDS INTUITY 23 MM AORTIC VALVE. (N/A) Endovein Harvest Of Greater Saphenous Vein (Left) Subjective: "feeling ok"  Objective: Vital signs in last 24 hours: Temp:  [97.6 F (36.4 C)-98 F (36.7 C)] 97.6 F (36.4 C) (04/05 0400) Pulse Rate:  [58-100] 84 (04/05 0900) Cardiac Rhythm: Atrial paced (04/05 0800) Resp:  [18-47] 29 (04/05 0900) SpO2:  [90 %-100 %] 97 % (04/05 0900) Arterial Line BP: (86-145)/(40-79) 121/55 (04/05 0900) Weight:  [112.6 kg] 112.6 kg (04/05 0615)  Hemodynamic parameters for last 24 hours:    Intake/Output from previous day: 04/04 0701 - 04/05 0700 In: 870.8 [I.V.:845.8; NG/GT:25] Out: 3427 [Chest Tube:430] Intake/Output this shift: Total I/O In: 35 [I.V.:35] Out: 345 [Other:345]  General appearance: alert and no distress Neurologic: intact Heart: regular rate and rhythm, S1, S2 normal, no murmur, click, rub or gallop Lungs: diminished breath sounds bibasilar Abdomen: soft, non-tender; bowel sounds normal; no masses,  no organomegaly Extremities: edema 1+ and warm extremities Wound: c/d/i  Lab Results: Recent Labs    10/30/19 0506 10/31/19 0403  WBC 13.7* 9.8  HGB 10.5* 9.6*  HCT 32.0* 30.2*  PLT 31* 30*   BMET:  Recent Labs    10/30/19 1559 10/31/19 0403  NA 137 138  K 5.3* 4.9  CL 103 102  CO2 23 24  GLUCOSE 132* 146*  BUN 19 20  CREATININE 2.73* 2.61*  CALCIUM 7.5* 7.5*    PT/INR: No results for input(s): LABPROT, INR in the last 72 hours. ABG    Component Value Date/Time   PHART 7.306 (L) 10/28/2019 1825   HCO3 22.6 10/28/2019 1825   TCO2 24 10/28/2019 1825   ACIDBASEDEF 4.0 (H) 10/28/2019 1825   O2SAT 94.0 10/28/2019 1825   CBG (last 3)  Recent Labs    10/31/19 0007  10/31/19 0406 10/31/19 0758  GLUCAP 113* 138* 82    Assessment/Plan: S/P Procedure(s) (LRB): REDO CORONARY ARTERY BYPASS GRAFTING TIMES 3  USING LEFT GREATER SAPHENOUS LEG VEIN HARVESTED ENDOSCOPICALLY(CABG) (N/A) TRANSESOPHAGEAL ECHOCARDIOGRAM (TEE) (N/A) AORTIC VALVE REPLACEMENT (AVR) USING EDWARDS INTUITY 23 MM AORTIC VALVE. (N/A) Endovein Harvest Of Greater Saphenous Vein (Left) Mobilize Diuresis consult IR for tunneled, durable IV access as the only access presently is femoral vein  Needs nasojejunal feeding tube--consult placed Needs aggressive PT/OT/inpatient rehab. Isys Tietje Z. Orvan Seen, MD [phone]   LOS: 7 days    Wonda Olds 10/31/2019

## 2019-10-31 NOTE — Progress Notes (Signed)
OT Cancellation Note  Patient Details Name: Patrick [name] MRN: [id number] DOB: 26-Aug-1955   Cancelled Treatment:    Reason Eval/Treat Not Completed: Patient at procedure or test/ unavailable(RN reports going to IR for placement of central line. )  OT to continue to follow for OT evaluation hopefully later today as schedule permits.  Jefferey Pica, OTR/L Acute Rehabilitation Services Pager: [phone] Office: [phone]   Adriella Essex C 10/31/2019, 11:13 AM

## 2019-10-31 NOTE — Procedures (Signed)
Cortrak  Person Inserting Tube:  Esaw Dace, RD Tube Type:  Cortrak - 43 inches Tube Location:  Right nare Initial Placement:  Stomach Secured by: Bridle Technique Used to Measure Tube Placement:  Documented cm marking at nare/ corner of mouth Cortrak Secured At:  83 cm Procedure Comments:  Cortrak Tube Team Note:  Consult received to place a Cortrak feeding tube.    No x-ray is required. RN may begin using tube. RD obtained post-pyloric placement initially (likely D4 placement) but then during bridle placement, pt moving around in bed, reaching for tube and tube retracted back into stomach. RD attempted multiple times to regain post-pyloric placement but unsuccessful. MD paged and ok with gastric placement. Discussed with RN  If the tube becomes dislodged please keep the tube and contact the Cortrak team at [email or web address] ([email or web address]) for replacement.  If after hours and replacement cannot be delayed, place a NG tube and confirm placement with an abdominal x-ray.    Kerman Passey MS, RDN, LDN, CNSC RD Pager Number and Weekend/On-Call After Hours Pager Located in [place]

## 2019-10-31 NOTE — Progress Notes (Signed)
Nutrition Follow-up   DOCUMENTATION CODES:   Not applicable  INTERVENTION:   Tube Feeding:  Vital 1.5 at 65 ml/hr via Cortrak tube Pro-Stat 30 mL BID Provides 135 g of protein, 2540 kcals and 1192 mL of free water  Continue Rena-Vit daily (renal MVI)   NUTRITION DIAGNOSIS:   Inadequate oral intake related to acute illness as evidenced by NPO status.  Ongoing  GOAL:   Patient will meet greater than or equal to 90% of their needs   Progressing    MONITOR:   TF tolerance, Labs, Weight trends, Diet advancement  ASSESSMENT:   64 yo male admitted with NSTEMI and found to have mutlivessel CAD and underwent CABG on 4/1, ESRD/HD requiring CRRT. PMH includes ESRD on HD s/p renal transplant, CAD s/p CABG, CHF, DM   3/29 Admission 3/30 Cardiac Cath 4/01 CABG with IABP placement (removed 4/3), Intubated 4/02 CRRT initiated 4/04 Extubated 4/05 Cortrak placed, tip in stomach; okay to feed gastric per RD discussion with Dr. Orvan Seen  Remains on CRRT.  S/P SLP swallow evaluation 4/4; SLP recommended NPO with small sips of water, ice chips, and oral meds crushed in applesauce.  Weight 112.6 kg today, lowest weight since admission 94.1 kg on 3/29. I/O + 1.4 L since admission  Labs reviewed. Phos 1.9 (L), mag 2.6 (L), creat 2.61 (H), K 4.9 (WNL) CBG's: [phone]  Medications reviewed and include vasopressin, colace, novolog, rena-vit. IVF: 1/2 NS at 10 ml/h  NFPE completed. No depletions noted, but edema could be masking muscle depletions.  Nutrition Focused Physical Exam  Subcutaneous Fat  Orbital Region    No depletion  Upper Arm Region    No depletion  Thoracic and Lumbar Region  Unable to assess  Buccal Region    No depletion   Muscle  Temple Region     No depletion  Clavicle Bone Region     No depletion  Clavicle and Acromion Bone Region   No depletion  Scapular Bone Region    No depletion  Dorsal Hand      No depletion  Patellar Region     No depletion   Anterior Thigh Region    No depletion  Posterior Calf Region     No depletion   Edema RD  Edema (RD Assessment)   Moderate   Micronutrient Assessment  Hair    Reviewed  Eyes    Unable to assess  Mouth    Unable to assess  Skin    Reviewed  Nails    Reviewed  Diet Order:   Diet Order    None      EDUCATION NEEDS:   Not appropriate for education at this time  Skin:  Skin Assessment: Skin Integrity Issues: Skin Integrity Issues:: DTI DTI: multiple toes  Last BM:  3/31  Height:   Ht Readings from Last 1 Encounters:  10/28/19 5' 6.5" (1.689 m)    Weight:   Wt Readings from Last 1 Encounters:  10/31/19 112.6 kg    BMI:  Body mass index is 39.47 kg/m.  Estimated Nutritional Needs:   Kcal:  0630-1601  Protein:  120-140 gm  Fluid:  1000 plus UOP   Molli Barrows, RD, LDN, CNSC Please refer to Amion for contact information.

## 2019-10-31 NOTE — H&P (Signed)
   Patient Status: [facility] - In-pt  Assessment and Plan: Patient in need of venous access.  Patient currently with L IJ tunneled dialysis catheter and right femoral central line in place.  Request made for placement of a new tunneled CVC catheter for infusions and lab draws.   Risks and benefits discussed with the patient including, but not limited to bleeding, infection, vascular injury, pneumothorax which may require chest tube placement, air embolism or even death  All of the patient's questions were answered, patient is agreeable to proceed. Consent signed and in chart.    ______________________________________________________________________   History of Present Illness: Patrick [name] is a 64 y.o. male with prior CABG 1997 s/p repeat 3 vessel CABG and AVR requiring Impella device remains critically ill due to cardiogenic shock.  He has been extubated, but remains on CVVHD. Primary team requesting new tunneled central line placement.   Allergies and medications reviewed.   Review of Systems: A 12 point ROS discussed and pertinent positives are indicated in the HPI above.  All other systems are negative.  Review of Systems  Unable to perform ROS: Mental status change    Vital Signs: BP 122/62   Pulse 84   Temp 97.6 F (36.4 C) (Oral)   Resp (!) 29   Ht 5' 6.5" (1.689 m)   Wt 248 lb 3.8 oz (112.6 kg)   SpO2 97%   BMI 39.47 kg/m   Physical Exam Vitals and nursing note reviewed.  Constitutional:      General: He is not in acute distress.    Appearance: He is well-developed. He is not ill-appearing.  Neck:     Comments: L IJ tunneled dialysis catheter Cardiovascular:     Rate and Rhythm: Normal rate and regular rhythm.  Pulmonary:     Effort: Pulmonary effort is normal.     Breath sounds: Normal breath sounds.  Genitourinary:    Comments: R femoral central line Skin:    General: Skin is warm and dry.  Neurological:     General: No focal deficit present.   Mental Status: He is alert. He is disoriented.      Imaging reviewed.   Labs:  COAGS: Recent Labs    10/27/19 0451 10/27/19 0451 10/27/19 1827 10/27/19 1827 10/28/19 0315 10/29/19 0108 10/29/19 0410 10/29/19 0929 10/29/19 1355 10/30/19 0506  INR 1.4*  --  2.3*  --  1.4*  --   --   --   --   --   APTT >200*   < > >200*   < > 32   < > 74* 68* 61* 58*   < > = values in this interval not displayed.    BMP: Recent Labs    10/29/19 1558 10/30/19 0506 10/30/19 1559 10/31/19 0403  NA 138 140 137 138  K 4.9 4.6 5.3* 4.9  CL 105 105 103 102  CO2 23 23 23 24   GLUCOSE 183* 150* 132* 146*  BUN 21 21 19 20   CALCIUM 7.2* 7.5* 7.5* 7.5*  CREATININE 3.61* 2.94* 2.73* 2.61*  GFRNONAA 17* 21* 23* 25*  GFRAA 19* 25* 27* 29*       Electronically Signed: Docia Barrier, PA 10/31/2019, 9:38 AM   I spent a total of 15 minutes in face to face in clinical consultation, greater than 50% of which was counseling/coordinating care for venous access.

## 2019-10-31 NOTE — Progress Notes (Signed)
RN aware of DC femoral line

## 2019-10-31 NOTE — Progress Notes (Signed)
Patient ID: Patrick [name], male   DOB: 1956-02-19, 64 y.o.   MRN: [id number] EVENING ROUNDS NOTE :     [address]       [place] [address]             [phone]                 4 Days Post-Op Procedure(s) (LRB): REDO CORONARY ARTERY BYPASS GRAFTING TIMES 3  USING LEFT GREATER SAPHENOUS LEG VEIN HARVESTED ENDOSCOPICALLY(CABG) (N/A) TRANSESOPHAGEAL ECHOCARDIOGRAM (TEE) (N/A) AORTIC VALVE REPLACEMENT (AVR) USING EDWARDS INTUITY 23 MM AORTIC VALVE. (N/A) Endovein Harvest Of Greater Saphenous Vein (Left)  Total Length of Stay:  LOS: 7 days  BP 122/62   Pulse 82   Temp 98.6 F (37 C) (Oral)   Resp (!) 26   Ht 5' 6.5" (1.689 m)   Wt 112.6 kg   SpO2 98%   BMI 39.47 kg/m   .Intake/Output      04/04 0701 - 04/05 0700 04/05 0701 - 04/06 0700   I.V. (mL/kg) 845.8 (7.5) 127.9 (1.1)   NG/GT 25    IV Piggyback     Total Intake(mL/kg) 870.8 (7.7) 127.9 (1.1)   Other 2997 509   Chest Tube 430 10   Total Output 3427 519   Net -2556.2 -391.1          .  prismasol BGK 4/2.5 400 mL/hr at 10/31/19 1420  .  prismasol BGK 4/2.5 200 mL/hr at 10/31/19 1420  . sodium chloride 10 mL/hr at 10/31/19 1400  . sodium chloride Stopped (10/30/19 1924)  .  ceFAZolin (ANCEF) IV    . epinephrine Stopped (10/30/19 1702)  . feeding supplement (VITAL 1.5 CAL) 1,000 mL (10/31/19 1631)  . nitroGLYCERIN Stopped (10/27/19 1722)  . prismasol BGK 4/2.5 1,800 mL/hr at 10/31/19 0934  . vasopressin (PITRESSIN) infusion - *FOR SHOCK* 0.02 Units/min (10/31/19 1000)     Lab Results  Component Value Date   WBC 9.8 10/31/2019   HGB 9.6 (L) 10/31/2019   HCT 30.2 (L) 10/31/2019   PLT 30 (L) 10/31/2019   GLUCOSE 146 (H) 10/31/2019   CHOL 122 02/15/2019   TRIG 85 02/15/2019   HDL 46 02/15/2019   LDLCALC 59 02/15/2019   ALT 27 10/28/2019   AST 74 (H) 10/28/2019   NA 138 10/31/2019   K 4.9 10/31/2019   CL 102 10/31/2019   CREATININE 2.61 (H) 10/31/2019   BUN 20 10/31/2019   CO2 24  10/31/2019   TSH 5.113 (H) 05/25/2016   INR 1.4 (H) 10/28/2019   HGBA1C 6.2 (H) 10/27/2019   New  Central line  And feeding tube to start tube feeding Vasopressin off Remains on cvvh   Grace Isaac MD  Beeper [phone] Office [phone] 10/31/2019 4:35 PM

## 2019-10-31 NOTE — Progress Notes (Signed)
Progress Note  Patient Name: Patrick [name] Date of Encounter: 10/31/2019  Primary Cardiologist: Dorris Carnes, MD   Subjective   This is a 64 year old male currently in 2H04 who underwent redo coronary artery bypass grafting x3 using left greater saphenous leg vein harvest endoscopically as well as aortic valve replacement using Edwards Intuity 23 mm aortic valve who is currently extubated, and status post removal of IABP.  He has challenging venous access, IR to consult for possible tunneled line. Planning for coretrack for feeding.  Had a 17 second run of VT on telemetry this AM.   Na 138, K 4.9, Gluc 146, Cr 2.61, Mg 2.6 Hgb 9.6, Plt 30k, WBC 9.8  Spoke to his nurse Patrick [name] who informed me that he was off all pressors just prior to going to IR for line placement, but had soft BP so vasopressin was restarted.   Inpatient Medications    Scheduled Meds: . acetaminophen  1,000 mg Oral Q6H   Or  . acetaminophen (TYLENOL) oral liquid 160 mg/5 mL  1,000 mg Per Tube Q6H  . aspirin EC  81 mg Oral Daily  . bisacodyl  10 mg Oral Daily   Or  . bisacodyl  10 mg Rectal Daily  . chlorhexidine  15 mL Mouth Rinse BID  . Chlorhexidine Gluconate Cloth  6 each Topical Daily  . clopidogrel  75 mg Oral Daily  . docusate sodium  200 mg Oral Daily  . insulin aspart  0-9 Units Subcutaneous Q4H  . mouth rinse  15 mL Mouth Rinse q12n4p  . metoprolol tartrate  12.5 mg Oral BID   Or  . metoprolol tartrate  12.5 mg Per Tube BID  . multivitamin  1 tablet Oral QHS  . pantoprazole  40 mg Oral Daily  . rosuvastatin  10 mg Oral q1800  . sodium chloride flush  10-40 mL Intracatheter Q12H  . sodium chloride flush  3 mL Intravenous Q12H   Continuous Infusions: .  prismasol BGK 4/2.5 400 mL/hr at 10/31/19 0418  .  prismasol BGK 4/2.5 200 mL/hr at 10/30/19 2008  . sodium chloride 10 mL/hr at 10/31/19 0900  . sodium chloride Stopped (10/30/19 1924)  .  ceFAZolin (ANCEF) IV    . epinephrine Stopped  (10/30/19 1702)  . nitroGLYCERIN Stopped (10/27/19 1722)  . prismasol BGK 4/2.5 1,800 mL/hr at 10/31/19 0934  . vasopressin (PITRESSIN) infusion - *FOR SHOCK* 0.02 Units/min (10/31/19 0900)   PRN Meds: sodium chloride, sodium chloride, dextrose, heparin, levalbuterol, metoprolol tartrate, morphine injection, ondansetron (ZOFRAN) IV, oxyCODONE, sodium chloride flush, sodium chloride flush, traMADol   Vital Signs    Vitals:   10/31/19 0730 10/31/19 0745 10/31/19 0800 10/31/19 0900  BP:      Pulse: 69 70 69 84  Resp: (!) 30 (!) 30 (!) 28 (!) 29  Temp:      TempSrc:      SpO2: 98% 90% 98% 97%  Weight:      Height:        Intake/Output Summary (Last 24 hours) at 10/31/2019 1011 Last data filed at 10/31/2019 0900 Gross per 24 hour  Intake 720.29 ml  Output 3310 ml  Net -2589.71 ml   Last 3 Weights 10/31/2019 10/30/2019 10/29/2019  Weight (lbs) 248 lb 3.8 oz 254 lb 10.1 oz 261 lb 7.5 oz  Weight (kg) 112.6 kg 115.5 kg 118.6 kg      Telemetry    Ectopy and 17 second episode of VT - Personally Reviewed  ECG  Sinus rhythm with Premature atrial complexes T wave abnormality inferior and anterolateral - Personally Reviewed  Physical Exam   Gen: arousable to speech, drowsy CV: RRR, no murmurs Lungs: shallow breathing, diminished in the bases Abd: soft, +bs Extrem: 1+ edema.  Labs    High Sensitivity Troponin:   Recent Labs  Lab 10/24/19 0737  TROPONINIHS 2,995*      Chemistry Recent Labs  Lab 10/27/19 0451 10/27/19 0927 10/28/19 2114 10/29/19 0410 10/30/19 0506 10/30/19 1559 10/31/19 0403  NA 140   < >  --    < > 140 137 138  K 4.4   < >  --    < > 4.6 5.3* 4.9  CL 98   < >  --    < > 105 103 102  CO2 22   < >  --    < > 23 23 24   GLUCOSE 133*   < >  --    < > 150* 132* 146*  BUN 46*   < >  --    < > 21 19 20   CREATININE 8.72*   < >  --    < > 2.94* 2.73* 2.61*  CALCIUM 8.4*   < >  --    < > 7.5* 7.5* 7.5*  PROT 7.3  --  4.8*  --   --   --   --   ALBUMIN 3.6    < > 2.9*   < > 2.6* 2.6* 2.6*  AST 97*  --  74*  --   --   --   --   ALT 46*  --  27  --   --   --   --   ALKPHOS 77  --  36*  --   --   --   --   BILITOT 1.1  --  0.8  --   --   --   --   GFRNONAA 6*   < >  --    < > 21* 23* 25*  GFRAA 7*   < >  --    < > 25* 27* 29*  ANIONGAP 20*   < >  --    < > 12 11 12    < > = values in this interval not displayed.     Hematology Recent Labs  Lab 10/29/19 0410 10/30/19 0506 10/31/19 0403  WBC 14.9* 13.7* 9.8  RBC 3.89* 3.43* 3.11*  HGB 11.6* 10.5* 9.6*  HCT 34.8* 32.0* 30.2*  MCV 89.5 93.3 97.1  MCH 29.8 30.6 30.9  MCHC 33.3 32.8 31.8  RDW 17.3* 17.8* 17.8*  PLT 28* 31* 30*    BNPNo results for input(s): BNP, PROBNP in the last 168 hours.   DDimer No results for input(s): DDIMER in the last 168 hours.   Radiology    DG Chest Port 1 View  Result Date: 10/31/2019 CLINICAL DATA:  CABG 4 days ago EXAM: PORTABLE CHEST 1 VIEW COMPARISON:  None. FINDINGS: Heart is enlarged. Mild edema is improving. The patient has been extubated. NG tube was removed. Left IJ dialysis catheter is in place. Bilateral chest tubes and mediastinal drain are noted. Aortic valve replacement is noted. Effusions and basilar airspace disease remains. Lung volumes are low. IMPRESSION: 1. Improving edema. 2. Persistent bilateral pleural effusions and basilar airspace disease, likely atelectasis. 3. Interval extubation and removal of NG tube. Electronically Signed   By: San Morelle M.D.   On: 10/31/2019 07:54    Cardiac Studies  Preoperative TTE 3/31:  1. Degree of mild global hypokinesis, with more pronounced wall motion  abnormalities in the anterior/anteroseptal/inferoseptal walls.. Left  ventricular ejection fraction, by estimation, is 45 to 50%. The left  ventricle has mildly decreased function. The  left ventricle demonstrates global hypokinesis. Left ventricular  diastolic parameters are consistent with Grade II diastolic dysfunction    (pseudonormalization). Elevated left ventricular end-diastolic pressure.  2. Right ventricular systolic function is normal. The right ventricular  size is normal. There is normal pulmonary artery systolic pressure.  3. Left atrial size was moderately dilated.  4. The mitral valve is normal in structure. Mild mitral valve  regurgitation. No evidence of mitral stenosis.  5. Appears it may be functionally bicuspid, with restriction/fusion of  the LCC/NCC. The aortic valve has an indeterminant number of cusps. Aortic  valve regurgitation is moderate. Mild aortic valve stenosis.  6. The inferior vena cava is normal in size with greater than 50%  respiratory variability, suggesting right atrial pressure of 3 mmHg.   Comparison(s): Changes from prior study are noted.   Conclusion(s)/Recommendation(s): Compared to prior study, EF worsened,  aortic regurgitation is now moderate.   Patient Profile     64 y.o. male s/p redo CABG and AVR for NSTEMI and moderate-severe aortic valve regurgitation in functionally bicuspid heavily calcified AV.   Assessment & Plan    1. CAD s/p redo CABG -  ASA 81 mg  Plavix 75 mg  Metoprolol tartrate 12.5 mg bid Crestor 10 mg  2. S/P AVR -  Will get baseline echocardiogram this admission, can consider today vs when out of ICU.   3. NSVT -  Received metoprolol prn, no afib, no further episodes. Monitor.      For questions or updates, please contact [place] Please consult [email or web address] for contact info under        Signed, Elouise Munroe, MD  10/31/2019, 10:11 AM

## 2019-10-31 NOTE — Procedures (Signed)
Interventional Radiology Procedure Note  Procedure: Placement of a right EJ approach single lumen, cuffed, modified PowerPicc/CVC.  Tip is positioned at the superior cavoatrial junction and catheter is ready for immediate use.   Difficult access requiring EJ [place] puncture, navigation of occluded BC vein, balloon angioplasty of stent to pass catheter.  Patient is disoriented and did not tolerate the procedure well.   HD's were stable throughout.   Complications: None  Recommendations:  - Ok to use - Do not submerge - Routine line care  - VIR PA or other nursing staff to remove retention stitch at the right chest wall in 48 hours.   Signed,  Dulcy Fanny. Earleen Newport, DO

## 2019-10-31 NOTE — Progress Notes (Signed)
PT Cancellation Note  Patient Details Name: Patrick [name] MRN: [id number] DOB: 07/15/1956   Cancelled Treatment:    Reason Eval/Treat Not Completed: Patient at procedure or test/unavailable; RN reports going to IR for placement of central line.  States he may be available for PT later today.  PT to follow up as time permits.    Reginia Naas 10/31/2019, 10:02 AM Magda Kiel, [place] [phone] 10/31/2019

## 2019-10-31 NOTE — Progress Notes (Signed)
SLP Cancellation Note  Patient Details Name: Patrick [name] MRN: [id number] DOB: 1956-02-04   Cancelled treatment:       Reason Eval/Treat Not Completed: Patient preparing to go to IR for tunneled cath; cortrak to be placed today.  SLP will follow for readiness.  Sheridan Gettel L. Tivis Ringer, [place] CCC/SLP Acute Rehabilitation Services Office number [phone] Pager [phone]    Juan Quam Laurice 10/31/2019, 9:46 AM

## 2019-11-01 ENCOUNTER — Inpatient Hospital Stay (HOSPITAL_COMMUNITY): Payer: Medicare HMO

## 2019-11-01 DIAGNOSIS — I255 Ischemic cardiomyopathy: Secondary | ICD-10-CM

## 2019-11-01 LAB — COMPREHENSIVE METABOLIC PANEL
ALT: 54 U/L — ABNORMAL HIGH (ref 0–44)
AST: 102 U/L — ABNORMAL HIGH (ref 15–41)
Albumin: 2.3 g/dL — ABNORMAL LOW (ref 3.5–5.0)
Alkaline Phosphatase: 67 U/L (ref 38–126)
Anion gap: 9 (ref 5–15)
BUN: 23 mg/dL (ref 8–23)
CO2: 25 mmol/L (ref 22–32)
Calcium: 7.4 mg/dL — ABNORMAL LOW (ref 8.9–10.3)
Chloride: 104 mmol/L (ref 98–111)
Creatinine, Ser: 2.35 mg/dL — ABNORMAL HIGH (ref 0.61–1.24)
GFR calc Af Amer: 32 mL/min — ABNORMAL LOW (ref >60)
GFR calc non Af Amer: 28 mL/min — ABNORMAL LOW (ref >60)
Glucose, Bld: 139 mg/dL — ABNORMAL HIGH (ref 70–99)
Potassium: 4.2 mmol/L (ref 3.5–5.1)
Sodium: 138 mmol/L (ref 135–145)
Total Bilirubin: 0.9 mg/dL (ref 0.3–1.2)
Total Protein: 5.2 g/dL — ABNORMAL LOW (ref 6.5–8.1)

## 2019-11-01 LAB — CBC
HCT: 29.6 % — ABNORMAL LOW (ref 39.0–52.0)
Hemoglobin: 9.3 g/dL — ABNORMAL LOW (ref 13.0–17.0)
MCH: 30.6 pg (ref 26.0–34.0)
MCHC: 31.4 g/dL (ref 30.0–36.0)
MCV: 97.4 fL (ref 80.0–100.0)
Platelets: 31 10*3/uL — ABNORMAL LOW (ref 150–400)
RBC: 3.04 MIL/uL — ABNORMAL LOW (ref 4.22–5.81)
RDW: 17.6 % — ABNORMAL HIGH (ref 11.5–15.5)
WBC: 7.3 10*3/uL (ref 4.0–10.5)
nRBC: 3.5 % — ABNORMAL HIGH (ref 0.0–0.2)

## 2019-11-01 LAB — RENAL FUNCTION PANEL
Albumin: 2.5 g/dL — ABNORMAL LOW (ref 3.5–5.0)
Albumin: 2.5 g/dL — ABNORMAL LOW (ref 3.5–5.0)
Anion gap: 11 (ref 5–15)
Anion gap: 10 (ref 5–15)
BUN: 21 mg/dL (ref 8–23)
BUN: 24 mg/dL — ABNORMAL HIGH (ref 8–23)
CO2: 23 mmol/L (ref 22–32)
CO2: 24 mmol/L (ref 22–32)
Calcium: 7.5 mg/dL — ABNORMAL LOW (ref 8.9–10.3)
Calcium: 7.4 mg/dL — ABNORMAL LOW (ref 8.9–10.3)
Chloride: 103 mmol/L (ref 98–111)
Chloride: 105 mmol/L (ref 98–111)
Creatinine, Ser: 2.55 mg/dL — ABNORMAL HIGH (ref 0.61–1.24)
Creatinine, Ser: 2.51 mg/dL — ABNORMAL HIGH (ref 0.61–1.24)
GFR calc Af Amer: 29 mL/min — ABNORMAL LOW (ref >60)
GFR calc Af Amer: 30 mL/min — ABNORMAL LOW (ref >60)
GFR calc non Af Amer: 25 mL/min — ABNORMAL LOW (ref >60)
GFR calc non Af Amer: 26 mL/min — ABNORMAL LOW (ref >60)
Glucose, Bld: 167 mg/dL — ABNORMAL HIGH (ref 70–99)
Glucose, Bld: 155 mg/dL — ABNORMAL HIGH (ref 70–99)
Phosphorus: 1.3 mg/dL — ABNORMAL LOW (ref 2.5–4.6)
Phosphorus: 2.9 mg/dL (ref 2.5–4.6)
Potassium: 4.2 mmol/L (ref 3.5–5.1)
Potassium: 4 mmol/L (ref 3.5–5.1)
Sodium: 137 mmol/L (ref 135–145)
Sodium: 139 mmol/L (ref 135–145)

## 2019-11-01 LAB — POCT I-STAT 7, (LYTES, BLD GAS, ICA,H+H)
Acid-Base Excess: 4 mmol/L — ABNORMAL HIGH (ref 0.0–2.0)
Acid-base deficit: 4 mmol/L — ABNORMAL HIGH (ref 0.0–2.0)
Bicarbonate: 20.8 mmol/L (ref 20.0–28.0)
Bicarbonate: 29.2 mmol/L — ABNORMAL HIGH (ref 20.0–28.0)
Calcium, Ion: 0.69 mmol/L — CL (ref 1.15–1.40)
Calcium, Ion: 1.08 mmol/L — ABNORMAL LOW (ref 1.15–1.40)
HCT: 15 % — ABNORMAL LOW (ref 39.0–52.0)
HCT: 26 % — ABNORMAL LOW (ref 39.0–52.0)
Hemoglobin: 5.1 g/dL — CL (ref 13.0–17.0)
Hemoglobin: 8.8 g/dL — ABNORMAL LOW (ref 13.0–17.0)
O2 Saturation: 100 %
O2 Saturation: 99 %
Patient temperature: 98
Potassium: 3.9 mmol/L (ref 3.5–5.1)
Potassium: 4.1 mmol/L (ref 3.5–5.1)
Sodium: 137 mmol/L (ref 135–145)
Sodium: 140 mmol/L (ref 135–145)
TCO2: 22 mmol/L (ref 22–32)
TCO2: 31 mmol/L (ref 22–32)
pCO2 arterial: 34.3 mmHg (ref 32.0–48.0)
pCO2 arterial: 45.3 mmHg (ref 32.0–48.0)
pH, Arterial: 7.39 (ref 7.350–7.450)
pH, Arterial: 7.416 (ref 7.350–7.450)
pO2, Arterial: 406 mmHg — ABNORMAL HIGH (ref 83.0–108.0)
pO2, Arterial: 140 mmHg — ABNORMAL HIGH (ref 83.0–108.0)

## 2019-11-01 LAB — GLUCOSE, CAPILLARY
Glucose-Capillary: 115 mg/dL — ABNORMAL HIGH (ref 70–99)
Glucose-Capillary: 121 mg/dL — ABNORMAL HIGH (ref 70–99)
Glucose-Capillary: 137 mg/dL — ABNORMAL HIGH (ref 70–99)
Glucose-Capillary: 139 mg/dL — ABNORMAL HIGH (ref 70–99)
Glucose-Capillary: 148 mg/dL — ABNORMAL HIGH (ref 70–99)
Glucose-Capillary: 163 mg/dL — ABNORMAL HIGH (ref 70–99)
Glucose-Capillary: 95 mg/dL (ref 70–99)

## 2019-11-01 LAB — LACTIC ACID, PLASMA
Lactic Acid, Venous: 0.9 mmol/L (ref 0.5–1.9)
Lactic Acid, Venous: 0.8 mmol/L (ref 0.5–1.9)

## 2019-11-01 LAB — PREPARE RBC (CROSSMATCH)

## 2019-11-01 LAB — ECHOCARDIOGRAM LIMITED
Height: 66.5 in
Weight: 3823.66 oz

## 2019-11-01 LAB — MAGNESIUM: Magnesium: 2.6 mg/dL — ABNORMAL HIGH (ref 1.7–2.4)

## 2019-11-01 LAB — AMMONIA: Ammonia: 14 umol/L (ref 9–35)

## 2019-11-01 MED ORDER — FENTANYL CITRATE (PF) 100 MCG/2ML IJ SOLN
25.0000 ug | INTRAMUSCULAR | Status: DC | PRN
  Administered 2019-11-10 – 2019-11-26 (×2): 25 ug via INTRAVENOUS
  Filled 2019-11-01 (×2): qty 2

## 2019-11-01 MED ORDER — ASPIRIN 81 MG PO CHEW
81.0000 mg | CHEWABLE_TABLET | Freq: Every day | ORAL | Status: DC
  Administered 2019-11-01 – 2019-11-02 (×2): 81 mg
  Filled 2019-11-01 (×2): qty 1

## 2019-11-01 MED ORDER — CLOPIDOGREL BISULFATE 75 MG PO TABS
75.0000 mg | ORAL_TABLET | Freq: Every day | ORAL | Status: DC
  Administered 2019-11-01 – 2019-11-02 (×2): 75 mg
  Filled 2019-11-01 (×2): qty 1

## 2019-11-01 MED ORDER — METOCLOPRAMIDE HCL 5 MG/ML IJ SOLN
5.0000 mg | Freq: Four times a day (QID) | INTRAMUSCULAR | Status: AC
  Administered 2019-11-01 – 2019-11-02 (×7): 5 mg via INTRAVENOUS
  Filled 2019-11-01 (×8): qty 2

## 2019-11-01 MED ORDER — PANTOPRAZOLE SODIUM 40 MG PO PACK
40.0000 mg | PACK | Freq: Every day | ORAL | Status: DC
  Administered 2019-11-01 – 2019-11-02 (×2): 40 mg
  Filled 2019-11-01 (×2): qty 20

## 2019-11-01 MED ORDER — PERFLUTREN LIPID MICROSPHERE
1.0000 mL | INTRAVENOUS | Status: AC | PRN
  Administered 2019-11-01: 2 mL via INTRAVENOUS
  Filled 2019-11-01: qty 10

## 2019-11-01 MED ORDER — SODIUM PHOSPHATES 45 MMOLE/15ML IV SOLN
25.0000 mmol | Freq: Once | INTRAVENOUS | Status: DC
  Filled 2019-11-01: qty 8.33

## 2019-11-01 MED ORDER — ADULT MULTIVITAMIN LIQUID CH
15.0000 mL | Freq: Every day | ORAL | Status: DC
  Administered 2019-11-01 – 2019-11-02 (×2): 15 mL
  Filled 2019-11-01 (×2): qty 15

## 2019-11-01 MED ORDER — ROSUVASTATIN CALCIUM 5 MG PO TABS
10.0000 mg | ORAL_TABLET | Freq: Every day | ORAL | Status: DC
  Administered 2019-11-01 – 2019-11-02 (×2): 10 mg
  Filled 2019-11-01 (×2): qty 2

## 2019-11-01 MED ORDER — BISACODYL 10 MG RE SUPP
10.0000 mg | Freq: Once | RECTAL | Status: AC
  Administered 2019-11-01: 10 mg via RECTAL
  Filled 2019-11-01: qty 1

## 2019-11-01 MED ORDER — SODIUM CHLORIDE 0.9% IV SOLUTION: Freq: Once | INTRAVENOUS | Status: AC

## 2019-11-01 MED ORDER — DOCUSATE SODIUM 50 MG/5ML PO LIQD
100.0000 mg | Freq: Every day | ORAL | Status: DC
  Administered 2019-11-01 – 2019-11-02 (×2): 100 mg
  Filled 2019-11-01 (×2): qty 10

## 2019-11-01 MED ORDER — LACTULOSE 10 GM/15ML PO SOLN
30.0000 g | Freq: Once | ORAL | Status: AC
  Administered 2019-11-01: 30 g
  Filled 2019-11-01: qty 45

## 2019-11-01 NOTE — Progress Notes (Signed)
PT Cancellation Note  Patient Details Name: Patrick [name] MRN: [id number] DOB: 1955-09-26   Cancelled Treatment:    Reason Eval/Treat Not Completed: Medical issues which prohibited therapy; currently vomiting bile and RN reports to go down for CT momentarily.  Wants PT/OT to check back later as needs at least EOB activity.  Will attempt again later.    Reginia Naas 11/01/2019, 9:20 AM  Magda Kiel, [place] [phone] 11/01/2019

## 2019-11-01 NOTE — Progress Notes (Signed)
SLP Cancellation Note  Patient Details Name: Patrick [name] MRN: [id number] DOB: 1956/03/30   Cancelled treatment:       Reason Eval/Treat Not Completed: Patient at procedure or test/unavailable.  Spoke with RN. Pt coughing up bile this am; now in CT.  SLP will follow for readiness to begin POs.  Has cortrak.  Hila Bolding L. Tivis Ringer, [place] CCC/SLP Acute Rehabilitation Services Office number [phone] Pager [phone]    Juan Quam Laurice 11/01/2019, 10:47 AM

## 2019-11-01 NOTE — Progress Notes (Signed)
5 Days Post-Op Procedure(s) (LRB): REDO CORONARY ARTERY BYPASS GRAFTING TIMES 3  USING LEFT GREATER SAPHENOUS LEG VEIN HARVESTED ENDOSCOPICALLY(CABG) (N/A) TRANSESOPHAGEAL ECHOCARDIOGRAM (TEE) (N/A) AORTIC VALVE REPLACEMENT (AVR) USING EDWARDS INTUITY 23 MM AORTIC VALVE. (N/A) Endovein Harvest Of Greater Saphenous Vein (Left) Subjective: No specific c/o  Objective: Vital signs in last 24 hours: Temp:  [97.8 F (36.6 C)-98.6 F (37 C)] 98 F (36.7 C) (04/06 0800) Pulse Rate:  [82-86] 84 (04/06 0945) Cardiac Rhythm: Atrial paced (04/06 0800) Resp:  [7-28] 21 (04/06 0945) SpO2:  [96 %-100 %] 100 % (04/06 0945) Arterial Line BP: (72-127)/(13-69) 127/57 (04/06 0945) Weight:  [108.4 kg] 108.4 kg (04/06 0527)  Hemodynamic parameters for last 24 hours:    Intake/Output from previous day: 04/05 0701 - 04/06 0700 In: 999.1 [I.V.:283; NG/GT:716.1] Out: 2870 [Chest Tube:220] Intake/Output this shift: Total I/O In: 10 [I.V.:10] Out: 540 [Emesis/NG output:250; Other:290]  General appearance: alert and mild distress Neurologic: intact Heart: regular rate and rhythm, S1, S2 normal, no murmur, click, rub or gallop Lungs: diminished breath sounds bibasilar Abdomen: mildly distended, nontender Extremities: extremities normal, atraumatic, no cyanosis or edema Wound: c/d/i  Lab Results: Recent Labs    10/31/19 0403 10/31/19 0403 11/01/19 0417 11/01/19 0803  WBC 9.8  --  7.3  --   HGB 9.6*   < > 9.3* 8.8*  HCT 30.2*   < > 29.6* 26.0*  PLT 30*  --  31*  --    < > = values in this interval not displayed.   BMET:  Recent Labs    11/01/19 0417 11/01/19 0417 11/01/19 0751 11/01/19 0803  NA 137   < > 138 140  K 4.2   < > 4.2 4.1  CL 103  --  104  --   CO2 23  --  25  --   GLUCOSE 167*  --  139*  --   BUN 21  --  23  --   CREATININE 2.55*  --  2.35*  --   CALCIUM 7.5*  --  7.4*  --    < > = values in this interval not displayed.    PT/INR: No results for input(s): LABPROT,  INR in the last 72 hours. ABG    Component Value Date/Time   PHART 7.416 11/01/2019 0803   HCO3 29.2 (H) 11/01/2019 0803   TCO2 31 11/01/2019 0803   ACIDBASEDEF 4.0 (H) 10/28/2019 1825   O2SAT 99.0 11/01/2019 0803   CBG (last 3)  Recent Labs    11/01/19 0058 11/01/19 0421 11/01/19 0742  GLUCAP 137* 163* 139*    Assessment/Plan: S/P Procedure(s) (LRB): REDO CORONARY ARTERY BYPASS GRAFTING TIMES 3  USING LEFT GREATER SAPHENOUS LEG VEIN HARVESTED ENDOSCOPICALLY(CABG) (N/A) TRANSESOPHAGEAL ECHOCARDIOGRAM (TEE) (N/A) AORTIC VALVE REPLACEMENT (AVR) USING EDWARDS INTUITY 23 MM AORTIC VALVE. (N/A) Endovein Harvest Of Greater Saphenous Vein (Left) Mobilize Diuresis CT abdomen  Check labs reglan for suspected ileus   LOS: 8 days    Patrick [name] 11/01/2019

## 2019-11-01 NOTE — Evaluation (Signed)
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 PMHx: ESRD MWF, HTN, PNA, T2DM, MI.   Clinical Impression   Pt PTA: pt was independent with ADL and mobility living at home with spouse. Pt is retired and pt's spouse continues to work. Pt currently limited by decreased ability to care for self, decreased arousal and decreased strength. Pt sitting at EOB for light ROM and exercise. Pt modA+2 for sit to stand and bed mobility. Pt kept eyes closed for most of session and difficult to arouse, but agreeable for therapy. Edema present in [place], but functional for use. Pt is a great candidate for CIR as pt and family motivated to return to PLOF.  O2 on 3L >90% throughout exertion; VSS. RN in room as CRRT running for lines.  Pt would benefit from continued OT skilled services. OT following acutely.     Follow Up Recommendations  CIR    Equipment Recommendations  Other (comment)(TBD)    Recommendations for Other Services Rehab consult     Precautions / Restrictions Precautions Precautions: Fall;Sternal Precaution Booklet Issued: No Precaution Comments: Sternal precautions initiated Restrictions Weight Bearing Restrictions: Yes Other Position/Activity Restrictions: sternal precautions      Mobility Bed Mobility Overal bed mobility: Needs Assistance Bed Mobility: Sidelying to Sit;Sit to Sidelying;Sit to Supine   Sidelying to sit: Mod assist;+2 for physical assistance   Sit to supine: Mod assist;+2 for physical assistance;+2 for safety/equipment Sit to sidelying: Mod assist;+2 for physical assistance;+2 for safety/equipment General bed mobility comments: Assist to position heart pillow in front of pt and direct log roll technique as pt keeping eyes closed for session  Transfers Overall transfer level: Needs  assistance Equipment used: 2 person hand held assist Transfers: Sit to/from Stand Sit to Stand: Mod assist;+2 physical assistance;+2 safety/equipment;From elevated surface         General transfer comment: +2 modA for sit to stand ~15 secs prior to requiring seated break due to fatigue. Pt abruptly sitting down.    Balance Overall balance assessment: Needs assistance Sitting-balance support: Bilateral upper extremity supported;Feet supported Sitting balance-Leahy Scale: Fair     Standing balance support: Bilateral upper extremity supported Standing balance-Leahy Scale: Poor Standing balance comment: Pt using bed to sustain stability                           ADL either performed or assessed with clinical judgement   ADL Overall ADL's : Needs assistance/impaired Eating/Feeding: NPO(cortrak)   Grooming: Maximal assistance;Sitting   Upper Body Bathing: Maximal assistance;Sitting   Lower Body Bathing: Total assistance;Sitting/lateral leans;Bed level   Upper Body Dressing : Maximal assistance;Standing;Bed level   Lower Body Dressing: Total assistance;Sitting/lateral leans;Bed level   Toilet Transfer: Maximal assistance;+2 for physical assistance;+2 for safety/equipment   Toileting- Clothing Manipulation and Hygiene: Maximal assistance;+2 for physical assistance;Sitting/lateral lean;Bed level       Functional mobility during ADLs: Maximal assistance;+2 for physical assistance;+2 for safety/equipment;Cueing for safety General ADL Comments: Pt limited by decreased ability to care for self, decreased arousal and decreased strength. Pt sitting at EOB for light ROM and exercise.     Vision Baseline Vision/History: No visual deficits Patient Visual Report: Other (comment)(Pt kept eye closed for most of session) Vision Assessment?: Vision impaired- to be further tested in functional context Additional Comments: Assess  when more alert     Perception     Praxis       Pertinent Vitals/Pain Pain Assessment: Faces Faces Pain Scale: Hurts little more Pain Location: stomach with  mobility Pain Descriptors / Indicators: Cramping;Grimacing Pain Intervention(s): Monitored during session;Repositioned     Hand Dominance Right   Extremity/Trunk Assessment Upper Extremity Assessment Upper Extremity Assessment: Defer to OT evaluation RUE Deficits / Details: edema noted in BUEs, AROM to 90* FF and elbow flex/ext, WFLs; wrist and hand decreaed ROM due to edema RUE Coordination: decreased fine motor LUE Deficits / Details: edema noted in BUEs, AROM to 90* FF and elbow flex/ext, WFLs; wrist and hand decreaed ROM due to edema LUE Coordination: decreased fine motor   Lower Extremity Assessment Lower Extremity Assessment: LLE deficits/detail;RLE deficits/detail RLE Deficits / Details: AAROM WFL, strength hip flexion 3-/5, knee extension 4-/5, ankle DF 4-/5 RLE Sensation: history of peripheral neuropathy;decreased light touch LLE Deficits / Details: AAROM WFL, strength hip flexion 3-/5, knee extension 4-/5, ankle DF 4-/5 LLE Sensation: history of peripheral neuropathy;decreased light touch   Cervical / Trunk Assessment Cervical / Trunk Assessment: Kyphotic   Communication Communication Communication: No difficulties   Cognition Arousal/Alertness: Lethargic Behavior During Therapy: Flat affect Overall Cognitive Status: Difficult to assess                                 General Comments: Pt very lethargic and requiring cues to open eyes. Pt following 1 step commands for MMT and ROM. Pt stating full name and "nowhere" for pain. Pain with movement noted.   General Comments  O2 on 3L >90% throughout exertion; VSS. RN in room as CRRT running for lines    Exercises Exercises: Shoulder Shoulder Exercises Shoulder Flexion: AROM;5 reps;Seated   Shoulder Instructions      Home Living Family/patient expects to be discharged to:: Private  residence Living Arrangements: Spouse/significant other Available Help at Discharge: Family;Available 24 hours/day Type of Home: Apartment Home Access: Stairs to enter Entrance Stairs-Number of Steps: 12 Entrance Stairs-Rails: Right;Left Home Layout: One level     Bathroom Shower/Tub: Teacher, early years/pre: Standard     Home Equipment: Cane - single point          Prior Functioning/Environment Level of Independence: Independent                 OT Problem List: Decreased strength;Decreased activity tolerance;Impaired balance (sitting and/or standing);Decreased safety awareness;Decreased cognition;Decreased knowledge of use of DME or AE;Cardiopulmonary status limiting activity;Pain;Increased edema;Impaired UE functional use      OT Treatment/Interventions: Self-care/ADL training;Therapeutic exercise;Neuromuscular education;Energy conservation;DME and/or AE instruction;Therapeutic activities;Cognitive remediation/compensation;Visual/perceptual remediation/compensation;Patient/family education;Balance training    OT Goals(Current goals can be found in the care plan section) Acute Rehab OT Goals Patient Stated Goal: per spouse: " to go to rehab." OT Goal Formulation: With patient/family Time For Goal Achievement: 11/15/19 Potential to Achieve Goals: Good ADL Goals Pt Will Perform Grooming: with min guard assist;sitting Pt Will Perform Lower Body Dressing: with mod assist;sitting/lateral leans;sit to/from stand Pt Will Transfer to Toilet: with min assist;squat pivot transfer;bedside commode Pt Will Perform Toileting - Clothing Manipulation and hygiene: with mod assist;sitting/lateral leans;sit to/from stand;with adaptive equipment Pt/caregiver will Perform Home Exercise Program: Increased strength;Both right and left upper extremity;With Supervision Additional ADL Goal #1: Pt will tolerate x10 mins of ADL tasks with O2 sats >90% with 1 rest break in order to  increase  activity tolerance.  OT Frequency: Min 2X/week   Barriers to D/C:            Co-evaluation PT/OT/SLP Co-Evaluation/Treatment: Yes Reason for Co-Treatment: Complexity of the patient's impairments (multi-system involvement);For patient/therapist safety;To address functional/ADL transfers PT goals addressed during session: Mobility/safety with mobility;Strengthening/ROM OT goals addressed during session: ADL's and self-care      AM-PAC OT "6 Clicks" Daily Activity     Outcome Measure Help from another person eating meals?: Total(cortrak) Help from another person taking care of personal grooming?: A Lot Help from another person toileting, which includes using toliet, bedpan, or urinal?: Total Help from another person bathing (including washing, rinsing, drying)?: A Lot Help from another person to put on and taking off regular upper body clothing?: A Little Help from another person to put on and taking off regular lower body clothing?: Total 6 Click Score: 10   End of Session Equipment Utilized During Treatment: Oxygen Nurse Communication: Mobility status  Activity Tolerance: Treatment limited secondary to medical complications (Comment);Patient limited by fatigue;Patient limited by lethargy Patient left: in bed;with call bell/phone within reach;with bed alarm set;with nursing/sitter in room  OT Visit Diagnosis: Unsteadiness on feet (R26.81);Muscle weakness (generalized) (M62.81);Pain;Other symptoms and signs involving cognitive function Pain - part of body: (chest)                Time: 7262-0355 OT Time Calculation (min): 24 min Charges:  OT General Charges $OT Visit: 1 Visit OT Evaluation $OT Eval High Complexity: 1 High  Jefferey Pica, OTR/L Acute Rehabilitation Services Pager: [phone] Office: [phone]   Jerene Pitch 11/01/2019, 4:59 PM

## 2019-11-01 NOTE — Evaluation (Signed)
Physical Therapy Evaluation Patient Details Name: Patrick [name] MRN: [id number] DOB: 1956-03-29 Today's Date: 11/01/2019   History of Present Illness  Pt is a 64 yo male s/p re-do CABGx3 with Aortic valve replacement. PMHx: ESRD MWF, HTN, PNA, T2DM, MI.  Clinical Impression  Patient presents with decreased mobility due to weakness, decreased balance, decreased activity tolerance and with sternal precautions.  Patient currently needing +2 A for mobility due to lines and precautions.  Patient will benefit from skilled PT in the acute setting to allow return home following likely needed CIR level rehab stay as pt lives in second floor apartment, but, per wife, would have 24 hour assist.     Follow Up Recommendations CIR    Equipment Recommendations  Other (comment)(TBA)    Recommendations for Other Services Rehab consult     Precautions / Restrictions Precautions Precautions: Fall;Sternal Precaution Booklet Issued: No Precaution Comments: chest tube Restrictions Weight Bearing Restrictions: Yes Other Position/Activity Restrictions: sternal precautions      Mobility  Bed Mobility Overal bed mobility: Needs Assistance Bed Mobility: Sidelying to Sit;Sit to Sidelying;Sit to Supine   Sidelying to sit: Mod assist;+2 for safety/equipment   Sit to supine: Mod assist;+2 for physical assistance;+2 for safety/equipment Sit to sidelying: Mod assist;+2 for physical assistance;+2 for safety/equipment General bed mobility comments: cues for technique, assist for lifting trunk from sidelying and for line management pt on CVVHD  Transfers Overall transfer level: Needs assistance Equipment used: 2 person hand held assist Transfers: Sit to/from Stand Sit to Stand: Mod assist;+2 physical assistance;+2 safety/equipment;From elevated surface         General transfer comment: +2 modA for sit to stand ~15 secs prior to requiring seated break due to fatigue. Pt abruptly sitting  down.  Ambulation/Gait                Stairs            Wheelchair Mobility    Modified Rankin (Stroke Patients Only)       Balance Overall balance assessment: Needs assistance Sitting-balance support: Feet supported Sitting balance-Leahy Scale: Fair     Standing balance support: Bilateral upper extremity supported Standing balance-Leahy Scale: Poor Standing balance comment: Pt using bed and bilat HHA                             Pertinent Vitals/Pain Pain Assessment: Faces Faces Pain Scale: Hurts little more Pain Location: stomach with  mobility Pain Descriptors / Indicators: Cramping;Grimacing Pain Intervention(s): Monitored during session;Repositioned    Home Living Family/patient expects to be discharged to:: Private residence Living Arrangements: Spouse/significant other Available Help at Discharge: Family;Available 24 hours/day Type of Home: Apartment Home Access: Stairs to enter Entrance Stairs-Rails: Psychiatric nurse of Steps: 12 Home Layout: One level Home Equipment: Cane - single point      Prior Function Level of Independence: Independent               Hand Dominance   Dominant Hand: Right    Extremity/Trunk Assessment   Upper Extremity Assessment Upper Extremity Assessment: Defer to OT evaluation RUE Deficits / Details: edema noted in BUEs, AROM to 90* FF and elbow flex/ext, WFLs; wrist and hand decreaed ROM due to edema RUE Coordination: decreased fine motor LUE Deficits / Details: edema noted in BUEs, AROM to 90* FF and elbow flex/ext, WFLs; wrist and hand decreaed ROM due to edema LUE Coordination: decreased fine motor  Lower Extremity Assessment Lower Extremity Assessment: LLE deficits/detail;RLE deficits/detail RLE Deficits / Details: AAROM WFL, strength hip flexion 3-/5, knee extension 4-/5, ankle DF 4-/5 RLE Sensation: history of peripheral neuropathy;decreased light touch LLE Deficits /  Details: AAROM WFL, strength hip flexion 3-/5, knee extension 4-/5, ankle DF 4-/5 LLE Sensation: history of peripheral neuropathy;decreased light touch    Cervical / Trunk Assessment Cervical / Trunk Assessment: Kyphotic  Communication   Communication: No difficulties  Cognition Arousal/Alertness: Lethargic Behavior During Therapy: Flat affect Overall Cognitive Status: Difficult to assess                                 General Comments: Pt very lethargic and requiring cues to open eyes. Pt following 1 step commands for MMT and ROM. Pt stating full name and "nowhere" for pain. Pain with movement noted.      General Comments General comments (skin integrity, edema, etc.): O2  @3LPM , VSS, RN in room to assist with lines, pt on CRRT    Exercises Shoulder Exercises Shoulder Flexion: AROM;5 reps;Seated   Assessment/Plan    PT Assessment Patient needs continued PT services  PT Problem List Decreased strength;Decreased activity tolerance;Decreased mobility;Cardiopulmonary status limiting activity;Decreased balance;Decreased knowledge of use of DME;Decreased knowledge of precautions       PT Treatment Interventions DME instruction;Therapeutic activities;Stair training;Gait training;Functional mobility training;Therapeutic exercise;Patient/family education;Balance training    PT Goals (Current goals can be found in the Care Plan section)  Acute Rehab PT Goals Patient Stated Goal: to go home PT Goal Formulation: With patient/family Time For Goal Achievement: 11/15/19 Potential to Achieve Goals: Good    Frequency Min 3X/week   Barriers to discharge        Co-evaluation PT/OT/SLP Co-Evaluation/Treatment: Yes Reason for Co-Treatment: Complexity of the patient's impairments (multi-system involvement);For patient/therapist safety;To address functional/ADL transfers PT goals addressed during session: Mobility/safety with mobility;Strengthening/ROM OT goals addressed  during session: ADL's and self-care       AM-PAC PT "6 Clicks" Mobility  Outcome Measure Help needed turning from your back to your side while in a flat bed without using bedrails?: A Lot Help needed moving from lying on your back to sitting on the side of a flat bed without using bedrails?: A Lot Help needed moving to and from a bed to a chair (including a wheelchair)?: A Lot Help needed standing up from a chair using your arms (e.g., wheelchair or bedside chair)?: A Lot Help needed to walk in hospital room?: Total Help needed climbing 3-5 steps with a railing? : Total 6 Click Score: 10    End of Session Equipment Utilized During Treatment: Oxygen Activity Tolerance: Patient limited by fatigue Patient left: in bed;with call bell/phone within reach;with family/visitor present;with nursing/sitter in room   PT Visit Diagnosis: Other abnormalities of gait and mobility (R26.89);Muscle weakness (generalized) (M62.81)    Time: 6789-3810 PT Time Calculation (min) (ACUTE ONLY): 24 min   Charges:   PT Evaluation $PT Eval Moderate Complexity: [place], [place] Acute Rehabilitation Services [phone] 11/01/2019   Reginia Naas 11/01/2019, 5:09 PM

## 2019-11-01 NOTE — Progress Notes (Signed)
Informed Dr. Roxy Manns of the tube feeds being stopped all day due to vomiting bile and KUB result.  Orders given and initiated for NGT placement to LIWS and to continue stop of TF

## 2019-11-01 NOTE — Progress Notes (Signed)
12 Fr. NGT placed per MD order and KUB/Abd x-ray ordered for placement.

## 2019-11-01 NOTE — Progress Notes (Deleted)
OT Cancellation Note  Patient Details Name: Patrick [name] MRN: [id number] DOB: May 13, 1956   Cancelled Treatment:    Reason Eval/Treat Not Completed: Fatigue/lethargy limiting ability to participate(Pt drowsy on bipap and receiving CRRT. OT continue to follow)   Pt shaking head "no" that he did not want to move today in bed; Pt nodding "yes" when asking if tomorrow would be better.  Jefferey Pica, OTR/L Acute Rehabilitation Services Pager: [phone] Office: [phone]   Hubbert Landrigan C 11/01/2019, 2:04 PM

## 2019-11-01 NOTE — Progress Notes (Addendum)
TCTS BRIEF SICU PROGRESS NOTE  5 Days Post-Op  S/P Procedure(s) (LRB): REDO CORONARY ARTERY BYPASS GRAFTING TIMES 3  USING LEFT GREATER SAPHENOUS LEG VEIN HARVESTED ENDOSCOPICALLY(CABG) (N/A) TRANSESOPHAGEAL ECHOCARDIOGRAM (TEE) (N/A) AORTIC VALVE REPLACEMENT (AVR) USING EDWARDS INTUITY 23 MM AORTIC VALVE. (N/A) Endovein Harvest Of Greater Saphenous Vein (Left)   Stable day AAI paced w/ stable BP Breathing comfortably w/ O2 sats 100% on 3 L/min Abdomen distended CT suggestive of ileus  Plan: Stop tube feeds and place NG to suction  Rexene Alberts, MD 11/01/2019 6:14 PM

## 2019-11-01 NOTE — Progress Notes (Signed)
  Echocardiogram 2D Echocardiogram with definity has been performed.  Darlina Sicilian M 11/01/2019, 11:10 AM

## 2019-11-01 NOTE — Progress Notes (Signed)
OT Cancellation Note  Patient Details Name: Patrick [name] MRN: [id number] DOB: 08/20/1955   Cancelled Treatment:    Reason Eval/Treat Not Completed: Patient not medically ready(Pt going for a CT and coughing up bile per RN. )   OT to continue to follow at next available treatment time, hopefully later today for OT evaluation.   Jefferey Pica, OTR/L Acute Rehabilitation Services Pager: [phone] Office: [phone]   Jefferey Pica 11/01/2019, 9:36 AM

## 2019-11-01 NOTE — Progress Notes (Signed)
[facility] Progress Note  Subjective: 1.8 L net neg yesterday.  BP's stable off pressors. Abd CT done today.   Vitals:   11/01/19 1100 11/01/19 1200 11/01/19 1210 11/01/19 1242  BP:    (!) 126/53  Pulse: 85 84 85   Resp: 18 16 20    Temp: 97.8 F (36.6 C)     TempSrc: Oral     SpO2: 100% 99% 100%   Weight:      Height:        Exam:  gen responsive, alert, not in distress  facial edema   Chest - coarse BS and some crackles at bases     Chest tubes in place    Cor no Rub or gallop    Abd soft obese    Ext 2+ edema UE's > LE's    Neuro awake, responsive    L IJ TDC ( LUA AVG not in use/ clotted)    Dialysis: [name] MWF   4h  99.5  2K/3.5Ca  P4  TDC   Hep 3000  - mircera 75 last 3/22  - hect 1   Assessment/ Plan: 1. CP/ ACS/ prior CABG '97 - sp CABG on 4/1 2. Cardiogenic shock - off of pressors as of 4/5.  3. ESRD - usual HD MWF, on CRRT here post-op.  4. Volume - vol excess, tolerating UF now, ^ to 125- 200 cc/hr, still up 9kg 5. Resp - stable on [place] O2 6. Anemia abl/ ckd - tsat 16%, ferr 1293.  Last esa was OP on 3/22, next dose due around 4/5 but  Hb > 11, no esa for now.  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   Inpatient medications: . sodium chloride   Intravenous Once  . acetaminophen  1,000 mg Oral Q6H   Or  . acetaminophen (TYLENOL) oral liquid 160 mg/5 mL  1,000 mg Per Tube Q6H  . aspirin  81 mg Per Tube Daily  . bisacodyl  10 mg Oral Daily   Or  . bisacodyl  10 mg Rectal Daily  . chlorhexidine  15 mL Mouth Rinse BID   . Chlorhexidine Gluconate Cloth  6 each Topical Daily  . [START ON 11/02/2019] clopidogrel  75 mg Per Tube Daily  . docusate  100 mg Per Tube Daily  . feeding supplement (PRO-STAT SUGAR FREE 64)  30 mL Per Tube BID  . insulin aspart  0-9 Units Subcutaneous Q4H  . mouth rinse  15 mL Mouth Rinse q12n4p  . metoCLOPramide (REGLAN) injection  5 mg Intravenous Q6H  . metoprolol tartrate  12.5 mg Oral BID   Or  . metoprolol tartrate  12.5 mg Per Tube BID  . multivitamin  15 mL Per  Tube Daily  . pantoprazole sodium  40 mg Per Tube Daily  . rosuvastatin  10 mg Per Tube q1800  . sodium chloride flush  10-40 mL Intracatheter Q12H  . sodium chloride flush  3 mL Intravenous Q12H   .  prismasol BGK 4/2.5 400 mL/hr at 11/01/19 1143  .  prismasol BGK 4/2.5 200 mL/hr at 11/01/19 1143  . sodium chloride 10 mL/hr at 10/31/19 1400  . sodium chloride Stopped (11/01/19 1108)  . feeding supplement (VITAL 1.5 CAL) 45 mL/hr at 11/01/19 0600  . prismasol BGK 4/2.5 1,800 mL/hr at 11/01/19 1143  . sodium phosphate  Dextrose 5% IVPB 43 mL/hr at 11/01/19 1108   sodium chloride, sodium chloride, dextrose, fentaNYL (SUBLIMAZE) injection, heparin, levalbuterol, metoprolol tartrate, ondansetron (ZOFRAN) IV, oxyCODONE, sodium chloride flush, sodium chloride flush, traMADol

## 2019-11-02 LAB — GLUCOSE, CAPILLARY
Glucose-Capillary: 112 mg/dL — ABNORMAL HIGH (ref 70–99)
Glucose-Capillary: 115 mg/dL — ABNORMAL HIGH (ref 70–99)
Glucose-Capillary: 118 mg/dL — ABNORMAL HIGH (ref 70–99)
Glucose-Capillary: 137 mg/dL — ABNORMAL HIGH (ref 70–99)
Glucose-Capillary: 151 mg/dL — ABNORMAL HIGH (ref 70–99)
Glucose-Capillary: 87 mg/dL (ref 70–99)

## 2019-11-02 LAB — RENAL FUNCTION PANEL
Albumin: 2.7 g/dL — ABNORMAL LOW (ref 3.5–5.0)
Anion gap: 12 (ref 5–15)
BUN: 22 mg/dL (ref 8–23)
CO2: 23 mmol/L (ref 22–32)
Calcium: 7.7 mg/dL — ABNORMAL LOW (ref 8.9–10.3)
Chloride: 103 mmol/L (ref 98–111)
Creatinine, Ser: 2.4 mg/dL — ABNORMAL HIGH (ref 0.61–1.24)
GFR calc Af Amer: 32 mL/min — ABNORMAL LOW (ref >60)
GFR calc non Af Amer: 27 mL/min — ABNORMAL LOW (ref >60)
Glucose, Bld: 139 mg/dL — ABNORMAL HIGH (ref 70–99)
Phosphorus: 2.1 mg/dL — ABNORMAL LOW (ref 2.5–4.6)
Potassium: 4.5 mmol/L (ref 3.5–5.1)
Sodium: 138 mmol/L (ref 135–145)

## 2019-11-02 LAB — CBC
HCT: 34.9 % — ABNORMAL LOW (ref 39.0–52.0)
Hemoglobin: 10.9 g/dL — ABNORMAL LOW (ref 13.0–17.0)
MCH: 30.6 pg (ref 26.0–34.0)
MCHC: 31.2 g/dL (ref 30.0–36.0)
MCV: 98 fL (ref 80.0–100.0)
Platelets: 39 10*3/uL — ABNORMAL LOW (ref 150–400)
RBC: 3.56 MIL/uL — ABNORMAL LOW (ref 4.22–5.81)
RDW: 18.4 % — ABNORMAL HIGH (ref 11.5–15.5)
WBC: 8 10*3/uL (ref 4.0–10.5)
nRBC: 3.2 % — ABNORMAL HIGH (ref 0.0–0.2)

## 2019-11-02 LAB — MAGNESIUM: Magnesium: 2.4 mg/dL (ref 1.7–2.4)

## 2019-11-02 MED ORDER — AMIODARONE HCL IN DEXTROSE 360-4.14 MG/200ML-% IV SOLN
60.0000 mg/h | INTRAVENOUS | Status: AC
  Administered 2019-11-02: 60 mg/h via INTRAVENOUS
  Filled 2019-11-02: qty 200

## 2019-11-02 MED ORDER — NOREPINEPHRINE 4 MG/250ML-% IV SOLN
2.0000 ug/min | INTRAVENOUS | Status: DC
  Administered 2019-11-02: 2 ug/min via INTRAVENOUS
  Administered 2019-11-02: 8 ug/min via INTRAVENOUS
  Administered 2019-11-03: 07:00:00 17 ug/min via INTRAVENOUS
  Filled 2019-11-02 (×5): qty 250

## 2019-11-02 MED ORDER — AMIODARONE LOAD VIA INFUSION
150.0000 mg | Freq: Once | INTRAVENOUS | Status: AC
  Administered 2019-11-02: 150 mg via INTRAVENOUS
  Filled 2019-11-02: qty 83.34

## 2019-11-02 MED ORDER — AMIODARONE HCL IN DEXTROSE 360-4.14 MG/200ML-% IV SOLN
30.0000 mg/h | INTRAVENOUS | Status: DC
  Administered 2019-11-02 – 2019-11-06 (×9): 30 mg/h via INTRAVENOUS
  Filled 2019-11-02 (×10): qty 200

## 2019-11-02 MED ORDER — HEPARIN SODIUM (PORCINE) 5000 UNIT/ML IJ SOLN
INTRAMUSCULAR | Status: AC
  Filled 2019-11-02: qty 1

## 2019-11-02 MED ORDER — AMIODARONE HCL IN DEXTROSE 360-4.14 MG/200ML-% IV SOLN
INTRAVENOUS | Status: AC
  Filled 2019-11-02: qty 200

## 2019-11-02 MED ORDER — HEPARIN SODIUM (PORCINE) 1000 UNIT/ML DIALYSIS
1000.0000 [IU] | INTRAMUSCULAR | Status: DC | PRN
  Administered 2019-11-02: 4100 [IU] via INTRAVENOUS_CENTRAL
  Filled 2019-11-02: qty 5
  Filled 2019-11-02 (×2): qty 6

## 2019-11-02 NOTE — Progress Notes (Signed)
Nutrition Follow-up  DOCUMENTATION CODES:   Not applicable  INTERVENTION:   Rrecommend trial of CL diet (pt will self-limit) vs trial of trickle TF via Cortrak to promote motility; MD wanting to keep pt NPO   Do not recommend TPN on this patient at this time; hopefully bowel function will improve enough over the next few days for oral/enteral nutrition   NUTRITION DIAGNOSIS:   Inadequate oral intake related to acute illness as evidenced by NPO status.  Continues  GOAL:   Patient will meet greater than or equal to 90% of their needs  Not Met  MONITOR:   TF tolerance, Labs, Weight trends, Diet advancement  REASON FOR ASSESSMENT:   Ventilator    ASSESSMENT:   64 yo male admitted with NSTEMI and found to have mutlivessel CAD and underwent CABG on 4/1, ESRD/HD requiring CRRT. PMH includes ESRD on HD s/p renal transplant, CAD s/p CABG, CHF, DM  Pt out of bed to chair. Pt remains on CRRT this AM but plan to transition off to iHD  CT abdomen with Cortrak tube tip at/just distal to the pylorus CT abdomen also indicating ileus   Per RN, NG tube placed over night but had no gastric output. Per RN, pt vomited bile yesterday and spitting up bile. If this continues, pt may benefit from reinsertion of NG tube with attempted decompression  +flatus, +stool. Pt reports he feels better today, no nausea. Pt wanting to eat, wants Cortrak out. Pt states that he can eat and drink if we take the Cortrak out.    SLP cleared pt to starte Dysphagia II diet with Thin Liquids. Discussed possibility of trial of CL with MD; and  MD wanting to keep pt NPO . Cortrak remains in place  Labs: Creatinine 2.40, BUN wdl, CBGs wdl  Meds: ss novolog, reglan, lactulose, MVI liquid  Diet Order:   Diet Order            Diet NPO time specified  Diet effective now              EDUCATION NEEDS:   Not appropriate for education at this time  Skin:  Skin Assessment: Skin Integrity Issues: Skin  Integrity Issues:: DTI DTI: multiple toes  Last BM:  4/06  Height:   Ht Readings from Last 1 Encounters:  10/28/19 5' 6.5" (1.689 m)    Weight:   Wt Readings from Last 1 Encounters:  11/02/19 104.7 kg    BMI:  Body mass index is 36.7 kg/m.  Estimated Nutritional Needs:   Kcal:  2300-2700  Protein:  120-140 gm  Fluid:  1000 plus UOP  Kerman Passey MS, RDN, LDN, CNSC RD Pager Number and Weekend/On-Call After Hours Pager Located in [place]

## 2019-11-02 NOTE — Progress Notes (Signed)
CT surgery p.m. Rounds  Patient resting comfortably after placement of dialysis catheter left IJ Sinus rhythm on IV amiodarone, AV paced Blood pressure stable on titrated norepinephrine 0-20 mcg O2 saturation 97% Patient had BM and ileus appears resolved

## 2019-11-02 NOTE — Progress Notes (Addendum)
Rehab Admissions Coordinator Note:  Patient was screened by Cleatrice Burke for appropriateness for an Inpatient Acute Rehab Consult per therapy recs. .  At this time, we are recommending Inpatient Rehab consult if you would like patient considered for CIR admit when medically ready. Noted possible ileus and CRRT.  Cleatrice Burke RN MSN 11/02/2019, 8:54 AM  I can be reached at [phone].

## 2019-11-02 NOTE — Progress Notes (Signed)
Paged Atkins MD in regards to low BP (MAPs 50s). Orders received for levophed gtt 2-10 mcg. Also questioned order by speech for diet since patient has ileus. Will keep patient NPO for now until bowels are working.

## 2019-11-02 NOTE — Progress Notes (Signed)
Patient noted to be in a fib 110s this morning. MAPs upper 50s-low 60s. Atkins MD notified. Ordered to bolus 150 mg amiodarone and start gtt. CRRT currently running net positive to stabilize BP.

## 2019-11-02 NOTE — Progress Notes (Signed)
Patient ID: Patrick [name], male   DOB: 12-07-1955, 64 y.o.   MRN: [id number]  Procedure: 10/31/19: Placement of a right EJ approach single lumen, cuffed, modified PowerPicc/CVC.  Tip is positioned at the superior cavoatrial junction and catheter is ready for immediate use.  Retention stitch removed from [place] site at chest wall-- without difficulty  New sterile dressing placed by RN

## 2019-11-02 NOTE — Progress Notes (Signed)
  Speech Language Pathology Treatment: Dysphagia  Patient Details Name: Patrick [name] MRN: [id number] DOB: Aug 27, 1955 Today's Date: 11/02/2019 Time: 8280-0349 SLP Time Calculation (min) (ACUTE ONLY): 10 min  Assessment / Plan / Recommendation Clinical Impression  Pt was seen for dysphagia treatment following clearance from RN to assess improvement in swallow function. Pt required mod-max encouragement from SLP and RN to participate in treatment. He tolerated limited trials of thin liquids and regular texture solids without overt s/sx of aspiration or change in respiratory function. Mastication time was prolonged with regular texture solids but no significant oral residue was noted. His NPO status will be maintained at this time due to ileus but if he maintains his current swallow and respiratory function, it appears that he would be safe to start a dysphagia 2 diet with thin liquids. SLP will continue to follow pt.    HPI HPI: 64 year old male with PMH of CABG in 1997 s/p stent, ESRD on HD s/p renal transplant, presented to [facility] 3/29 with CP, SOB. Dx NSTEMI and multivessel CAD. Underwent 3vCABG and AVR 4/1 and balloon pump placement. Intubated 4/1 for surgery, extubated 4/4.       SLP Plan  Continue with current plan of care       Recommendations  Diet recommendations: NPO(until ileus resolves) Medication Administration: Via alternative means                Oral Care Recommendations: Oral care QID Follow up Recommendations: Other (comment)(tba) SLP Visit Diagnosis: Dysphagia, unspecified (R13.10) Plan: Continue with current plan of care       Mkenzie Dotts I. Hardin Negus, [place], [place]ian Office number [phone] Pager [place] 11/02/2019, 10:34 AM

## 2019-11-02 NOTE — Progress Notes (Signed)
[facility] Progress Note  Subjective: 2.9 L net neg yest, went into afib this am and BP's dropped so not pulling anything now. Pt alert and w/o new c/o's.   Vitals:   11/02/19 0800 11/02/19 0900 11/02/19 1000 11/02/19 1100  BP:      Pulse: (!) 53 66 66   Resp: (!) 24 (!) 30 20 (!) 35  Temp: 99.2 F (37.3 C)     TempSrc: Axillary     SpO2: 99% 100% 99%   Weight:      Height:        Exam:  gen responsive, alert, not in distress, nasal O2   Chest - most clear, occ rhonchi    Cor no Rub or gallop    Abd soft obese    Ext 1-2+ edema UE's, no LE edema    Neuro awake, responsive    L IJ TDC ( LUA AVG not in use/ clotted)    Dialysis: [name] MWF   4h  99.5  2K/3.5Ca  P4  TDC   Hep 3000  - mircera 75 last 3/22  - hect 1   Assessment/ Plan: 1. CP/ ACS/ prior CABG '97 - sp CABG on 4/1 2. Cardiogenic shock - off of pressors as of 4/5.  3. ESRD - usual HD MWF, started CRRT pm 4/1. Off pressors and volume control is better, will dc CRRT and plan iHD from here, next HD prob Friday 4. Volume - vol excess improving, 4-5kg up by wts, resp stable 5. Resp - stable on [place] O2 6. Anemia abl/ ckd - tsat 16%, ferr 1293.  Last esa was OP on 3/22, next dose due around 4/5 but  Hb > 11, no esa for now.  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   Inpatient medications: . aspirin  81 mg Per Tube Daily  . bisacodyl  10 mg Oral Daily   Or  . bisacodyl  10 mg Rectal Daily  . chlorhexidine  15 mL Mouth Rinse BID  . Chlorhexidine Gluconate Cloth  6 each Topical Daily  . clopidogrel  75 mg Per Tube Daily  . docusate  100 mg Per Tube Daily  .  feeding supplement (PRO-STAT SUGAR FREE 64)  30 mL Per Tube BID  . insulin aspart  0-9 Units Subcutaneous Q4H  . mouth rinse  15 mL Mouth Rinse q12n4p  . metoCLOPramide (REGLAN) injection  5 mg Intravenous Q6H  . metoprolol tartrate  12.5 mg Oral BID   Or  . metoprolol tartrate  12.5 mg Per Tube BID  . multivitamin  15 mL Per Tube Daily  . pantoprazole sodium  40 mg Per Tube Daily  . rosuvastatin  10 mg Per Tube q1800  . sodium chloride flush  10-40 mL Intracatheter Q12H  . sodium chloride flush  3 mL Intravenous Q12H   .  prismasol BGK 4/2.5 400 mL/hr at 11/02/19 0059  .  prismasol BGK 4/2.5 200 mL/hr at 11/01/19 1143  . sodium chloride Stopped (10/31/19 1721)  . sodium chloride Stopped (11/02/19 0825)  . amiodarone 60 mg/hr (11/02/19 1100)   Followed by  . amiodarone    . feeding supplement (VITAL 1.5 CAL) 45 mL/hr at 11/01/19 0600  . norepinephrine (LEVOPHED) Adult infusion 2 mcg/min (11/02/19 1100)  . prismasol BGK 4/2.5 1,800 mL/hr at 11/02/19 0828  . sodium phosphate  Dextrose 5% IVPB Stopped (11/01/19 1709)   sodium chloride, sodium chloride, dextrose, fentaNYL (SUBLIMAZE) injection, heparin, levalbuterol, metoprolol tartrate, ondansetron (ZOFRAN) IV, oxyCODONE, sodium chloride flush, sodium chloride flush, traMADol

## 2019-11-02 NOTE — Progress Notes (Signed)
Physical Therapy Treatment Patient Details Name: Patrick [name] MRN: [id number] DOB: 30-Sep-1955 Today's Date: 11/02/2019    History of Present Illness Pt is a 64 yo male s/p re-do CABGx3 with Aortic valve replacement. PMHx: ESRD MWF, HTN, PNA, T2DM, MI.    PT Comments    Patient progressing to OOB this session.  Still weak and unsteady on his feet, but tolerating procedure with fatigue noted. RN in room to assist with lines with pt on CRRT.  Still appropriate for inpatient rehab.  PT to follow acutely.   Follow Up Recommendations  CIR     Equipment Recommendations  Other (comment)    Recommendations for Other Services       Precautions / Restrictions Precautions Precautions: Fall;Sternal Precaution Comments: chest tube, had NGT to suction, but came out this am Restrictions Weight Bearing Restrictions: Yes(sternal precautions)    Mobility  Bed Mobility Overal bed mobility: Needs Assistance     Sidelying to sit: Mod assist;HOB elevated       General bed mobility comments: cues for technique, assist for lifting trunk and guiding legs  Transfers Overall transfer level: Needs assistance Equipment used: 2 person hand held assist Transfers: Sit to/from Stand;Stand Pivot Transfers Sit to Stand: Mod assist;+2 physical assistance Stand pivot transfers: Mod assist;+2 physical assistance       General transfer comment: some lifting help to stand with momentum from EOB, assist for balance with stand step to chair, RN in room to help manage lines on CRRT, chest tube, a-line, etc  Ambulation/Gait                 Stairs             Wheelchair Mobility    Modified Rankin (Stroke Patients Only)       Balance Overall balance assessment: Needs assistance Sitting-balance support: Feet supported Sitting balance-Leahy Scale: Fair     Standing balance support: Bilateral upper extremity supported Standing balance-Leahy Scale: Poor Standing balance comment:  unsteady with stand step to chair with bilat HHA                            Cognition Arousal/Alertness: Awake/alert Behavior During Therapy: WFL for tasks assessed/performed Overall Cognitive Status: Within Functional Limits for tasks assessed                                        Exercises Other Exercises Other Exercises: encouraged ankle pumps, pt falling asleep after up to chair    General Comments General comments (skin integrity, edema, etc.): Remains on O2 and CRRT      Pertinent Vitals/Pain Pain Assessment: Faces Faces Pain Scale: Hurts little more Pain Location: stomach with  mobility Pain Descriptors / Indicators: Cramping;Grimacing Pain Intervention(s): Monitored during session;Repositioned    Home Living                      Prior Function            PT Goals (current goals can now be found in the care plan section) Progress towards PT goals: Progressing toward goals    Frequency    Min 3X/week(TBA at next venue)      PT Plan Current plan remains appropriate    Co-evaluation              AM-PAC PT "  6 Clicks" Mobility   Outcome Measure  Help needed turning from your back to your side while in a flat bed without using bedrails?: A Little Help needed moving from lying on your back to sitting on the side of a flat bed without using bedrails?: A Lot Help needed moving to and from a bed to a chair (including a wheelchair)?: A Lot Help needed standing up from a chair using your arms (e.g., wheelchair or bedside chair)?: A Lot Help needed to walk in hospital room?: Total Help needed climbing 3-5 steps with a railing? : Total 6 Click Score: 11    End of Session Equipment Utilized During Treatment: Oxygen Activity Tolerance: Other (comment)(bed to chair while on CRRT) Patient left: in chair;with call bell/phone within reach;with nursing/sitter in room   PT Visit Diagnosis: Other abnormalities of gait and  mobility (R26.89);Muscle weakness (generalized) (M62.81)     Time: 8309-4076 PT Time Calculation (min) (ACUTE ONLY): 15 min  Charges:  $Therapeutic Activity: 8-22 mins                     Magda Kiel, [place] [facility] [phone] 11/02/2019    Reginia Naas 11/02/2019, 10:41 AM

## 2019-11-03 ENCOUNTER — Other Ambulatory Visit: Payer: Self-pay | Admitting: Endocrinology

## 2019-11-03 ENCOUNTER — Inpatient Hospital Stay (HOSPITAL_COMMUNITY): Payer: Medicare HMO

## 2019-11-03 ENCOUNTER — Ambulatory Visit: Payer: Medicare HMO | Admitting: Podiatry

## 2019-11-03 DIAGNOSIS — E1122 Type 2 diabetes mellitus with diabetic chronic kidney disease: Secondary | ICD-10-CM

## 2019-11-03 DIAGNOSIS — N186 End stage renal disease: Secondary | ICD-10-CM

## 2019-11-03 LAB — CBC
HCT: 31.9 % — ABNORMAL LOW (ref 39.0–52.0)
Hemoglobin: 10 g/dL — ABNORMAL LOW (ref 13.0–17.0)
MCH: 30.5 pg (ref 26.0–34.0)
MCHC: 31.3 g/dL (ref 30.0–36.0)
MCV: 97.3 fL (ref 80.0–100.0)
Platelets: 56 10*3/uL — ABNORMAL LOW (ref 150–400)
RBC: 3.28 MIL/uL — ABNORMAL LOW (ref 4.22–5.81)
RDW: 19.2 % — ABNORMAL HIGH (ref 11.5–15.5)
WBC: 11.9 10*3/uL — ABNORMAL HIGH (ref 4.0–10.5)
nRBC: 2 % — ABNORMAL HIGH (ref 0.0–0.2)

## 2019-11-03 LAB — GLUCOSE, CAPILLARY
Glucose-Capillary: 103 mg/dL — ABNORMAL HIGH (ref 70–99)
Glucose-Capillary: 121 mg/dL — ABNORMAL HIGH (ref 70–99)
Glucose-Capillary: 131 mg/dL — ABNORMAL HIGH (ref 70–99)
Glucose-Capillary: 135 mg/dL — ABNORMAL HIGH (ref 70–99)
Glucose-Capillary: 156 mg/dL — ABNORMAL HIGH (ref 70–99)

## 2019-11-03 LAB — BASIC METABOLIC PANEL
Anion gap: 13 (ref 5–15)
BUN: 42 mg/dL — ABNORMAL HIGH (ref 8–23)
CO2: 20 mmol/L — ABNORMAL LOW (ref 22–32)
Calcium: 7.2 mg/dL — ABNORMAL LOW (ref 8.9–10.3)
Chloride: 103 mmol/L (ref 98–111)
Creatinine, Ser: 4.37 mg/dL — ABNORMAL HIGH (ref 0.61–1.24)
GFR calc Af Amer: 15 mL/min — ABNORMAL LOW (ref >60)
GFR calc non Af Amer: 13 mL/min — ABNORMAL LOW (ref >60)
Glucose, Bld: 137 mg/dL — ABNORMAL HIGH (ref 70–99)
Potassium: 4.5 mmol/L (ref 3.5–5.1)
Sodium: 136 mmol/L (ref 135–145)

## 2019-11-03 MED ORDER — MIDODRINE HCL 5 MG PO TABS
10.0000 mg | ORAL_TABLET | Freq: Three times a day (TID) | ORAL | Status: DC
  Administered 2019-11-03 – 2019-11-07 (×11): 10 mg via ORAL
  Filled 2019-11-03 (×11): qty 2

## 2019-11-03 MED ORDER — ASPIRIN 81 MG PO CHEW
81.0000 mg | CHEWABLE_TABLET | Freq: Every day | ORAL | Status: DC
  Administered 2019-11-03 – 2019-11-07 (×5): 81 mg via ORAL
  Filled 2019-11-03 (×5): qty 1

## 2019-11-03 MED ORDER — CHLORHEXIDINE GLUCONATE CLOTH 2 % EX PADS: 6.0000 | MEDICATED_PAD | Freq: Every day | CUTANEOUS | Status: DC

## 2019-11-03 MED ORDER — ADULT MULTIVITAMIN W/MINERALS CH
1.0000 | ORAL_TABLET | Freq: Every day | ORAL | Status: DC
  Administered 2019-11-03: 1 via ORAL
  Filled 2019-11-03 (×2): qty 1

## 2019-11-03 MED ORDER — ORAL CARE MOUTH RINSE
15.0000 mL | Freq: Two times a day (BID) | OROMUCOSAL | Status: DC
  Administered 2019-11-04 – 2019-11-14 (×19): 15 mL via OROMUCOSAL

## 2019-11-03 MED ORDER — NOREPINEPHRINE 16 MG/250ML-% IV SOLN
0.0000 ug/min | INTRAVENOUS | Status: DC
  Administered 2019-11-03: 13 ug/min via INTRAVENOUS
  Administered 2019-11-04 – 2019-11-06 (×5): 20 ug/min via INTRAVENOUS
  Administered 2019-11-07: 35 ug/min via INTRAVENOUS
  Administered 2019-11-07: 38 ug/min via INTRAVENOUS
  Administered 2019-11-07: 32 ug/min via INTRAVENOUS
  Administered 2019-11-08 – 2019-11-09 (×2): 20 ug/min via INTRAVENOUS
  Administered 2019-11-09: 19 ug/min via INTRAVENOUS
  Administered 2019-11-11: 39 ug/min via INTRAVENOUS
  Administered 2019-11-12: 19:00:00 26.987 ug/min via INTRAVENOUS
  Administered 2019-11-12: 29 ug/min via INTRAVENOUS
  Administered 2019-11-13 – 2019-11-15 (×2): 16 ug/min via INTRAVENOUS
  Administered 2019-11-15 – 2019-11-16 (×2): 14 ug/min via INTRAVENOUS
  Administered 2019-11-17: 10 ug/min via INTRAVENOUS
  Administered 2019-11-18: 18:00:00 27 ug/min via INTRAVENOUS
  Administered 2019-11-19: 14:00:00 20 ug/min via INTRAVENOUS
  Administered 2019-11-22: 17 ug/min via INTRAVENOUS
  Administered 2019-11-22 – 2019-11-24 (×2): 20 ug/min via INTRAVENOUS
  Administered 2019-11-24: 14 ug/min via INTRAVENOUS
  Administered 2019-11-26: 3 ug/min via INTRAVENOUS
  Administered 2019-11-26: 10:00:00 2 ug/min via INTRAVENOUS
  Administered 2019-11-29: 16:00:00 10 ug/min via INTRAVENOUS
  Filled 2019-11-03 (×40): qty 250

## 2019-11-03 MED ORDER — DOCUSATE SODIUM 100 MG PO CAPS
100.0000 mg | ORAL_CAPSULE | Freq: Every day | ORAL | Status: DC
  Administered 2019-11-06 – 2019-11-08 (×2): 100 mg via ORAL
  Filled 2019-11-03 (×5): qty 1

## 2019-11-03 MED ORDER — CLOPIDOGREL BISULFATE 75 MG PO TABS
75.0000 mg | ORAL_TABLET | Freq: Every day | ORAL | Status: DC
  Administered 2019-11-03 – 2019-11-18 (×15): 75 mg via ORAL
  Filled 2019-11-03 (×15): qty 1

## 2019-11-03 MED ORDER — INSULIN ASPART 100 UNIT/ML ~~LOC~~ SOLN
0.0000 [IU] | Freq: Three times a day (TID) | SUBCUTANEOUS | Status: DC
  Administered 2019-11-03 – 2019-11-04 (×2): 1 [IU] via SUBCUTANEOUS
  Administered 2019-11-04: 2 [IU] via SUBCUTANEOUS
  Administered 2019-11-04 – 2019-11-05 (×5): 1 [IU] via SUBCUTANEOUS
  Administered 2019-11-05: 2 [IU] via SUBCUTANEOUS
  Administered 2019-11-07 – 2019-11-08 (×6): 1 [IU] via SUBCUTANEOUS
  Administered 2019-11-09: 2 [IU] via SUBCUTANEOUS
  Administered 2019-11-09 – 2019-11-10 (×4): 1 [IU] via SUBCUTANEOUS
  Administered 2019-11-10: 2 [IU] via SUBCUTANEOUS
  Administered 2019-11-10 – 2019-11-11 (×5): 1 [IU] via SUBCUTANEOUS
  Administered 2019-11-12: 17:00:00 2 [IU] via SUBCUTANEOUS
  Administered 2019-11-12 (×2): 1 [IU] via SUBCUTANEOUS
  Administered 2019-11-12: 2 [IU] via SUBCUTANEOUS
  Administered 2019-11-13 – 2019-11-14 (×5): 1 [IU] via SUBCUTANEOUS
  Administered 2019-11-14 (×2): 2 [IU] via SUBCUTANEOUS
  Administered 2019-11-14: 1 [IU] via SUBCUTANEOUS
  Administered 2019-11-15 (×3): 2 [IU] via SUBCUTANEOUS
  Administered 2019-11-16: 13:00:00 3 [IU] via SUBCUTANEOUS
  Administered 2019-11-16: 2 [IU] via SUBCUTANEOUS
  Administered 2019-11-16: 1 [IU] via SUBCUTANEOUS
  Administered 2019-11-16 – 2019-11-17 (×2): 2 [IU] via SUBCUTANEOUS
  Administered 2019-11-17: 1 [IU] via SUBCUTANEOUS
  Administered 2019-11-17 (×2): 2 [IU] via SUBCUTANEOUS
  Administered 2019-11-18: 7 [IU] via SUBCUTANEOUS
  Administered 2019-11-18: 22:00:00 3 [IU] via SUBCUTANEOUS
  Administered 2019-11-18: 2 [IU] via SUBCUTANEOUS
  Administered 2019-11-19 (×4): 3 [IU] via SUBCUTANEOUS
  Administered 2019-11-20 (×3): 2 [IU] via SUBCUTANEOUS

## 2019-11-03 MED ORDER — ACETAMINOPHEN 325 MG PO TABS
650.0000 mg | ORAL_TABLET | Freq: Four times a day (QID) | ORAL | Status: DC | PRN
  Administered 2019-11-03 – 2019-11-17 (×7): 650 mg via ORAL
  Filled 2019-11-03 (×8): qty 2

## 2019-11-03 MED ORDER — PANTOPRAZOLE SODIUM 40 MG PO TBEC
40.0000 mg | DELAYED_RELEASE_TABLET | Freq: Every day | ORAL | Status: DC
  Administered 2019-11-03 – 2019-11-17 (×15): 40 mg via ORAL
  Filled 2019-11-03 (×15): qty 1

## 2019-11-03 MED ORDER — ROSUVASTATIN CALCIUM 5 MG PO TABS
10.0000 mg | ORAL_TABLET | Freq: Every day | ORAL | Status: DC
  Administered 2019-11-03 – 2019-11-17 (×15): 10 mg via ORAL
  Filled 2019-11-03 (×15): qty 2

## 2019-11-03 NOTE — Progress Notes (Signed)
Per Orvan Seen MD, okay to advance patient's diet as tolerated. Will start with clear liquids. Also ordered to wean levophed for SBP > 90.

## 2019-11-03 NOTE — Progress Notes (Signed)
Patient ID: Patrick [name], male   DOB: [birth year], 64 y.o.   MRN: [id number] EVENING ROUNDS NOTE :     [address]       [place], [address]             [phone]                 7 Days Post-Op Procedure(s) (LRB): REDO CORONARY ARTERY BYPASS GRAFTING TIMES 3  USING LEFT GREATER SAPHENOUS LEG VEIN HARVESTED ENDOSCOPICALLY(CABG) (N/A) TRANSESOPHAGEAL ECHOCARDIOGRAM (TEE) (N/A) AORTIC VALVE REPLACEMENT (AVR) USING EDWARDS INTUITY 23 MM AORTIC VALVE. (N/A) Endovein Harvest Of Greater Saphenous Vein (Left)  Total Length of Stay:  LOS: 10 days  BP (!) 84/48 (BP Location: Right Wrist)   Pulse 90   Temp (!) 100.9 F (38.3 C)   Resp (!) 28   Ht 5' 6.5" (1.689 m)   Wt 104.7 kg   SpO2 100%   BMI 36.70 kg/m   .Intake/Output      04/07 0701 - 04/08 0700 04/08 0701 - 04/09 0700   I.V. (mL/kg) 1287.5 (12.3) 404.9 (3.9)   Blood     NG/GT 230    IV Piggyback 7.1    Total Intake(mL/kg) 1524.6 (14.6) 404.9 (3.9)   Emesis/NG output     Other 181    Stool 0 0   Chest Tube 310 70   Total Output 491 70   Net +1033.6 +334.9        Stool Occurrence 1 x 1 x     . sodium chloride Stopped (10/31/19 1721)  . sodium chloride Stopped (11/02/19 0825)  . amiodarone 30 mg/hr (11/03/19 1500)  . feeding supplement (VITAL 1.5 CAL) 45 mL/hr at 11/01/19 0600  . norepinephrine (LEVOPHED) Adult infusion 6 mcg/min (11/03/19 1500)  . sodium phosphate  Dextrose 5% IVPB Stopped (11/01/19 1709)     Lab Results  Component Value Date   WBC 11.9 (H) 11/03/2019   HGB 10.0 (L) 11/03/2019   HCT 31.9 (L) 11/03/2019   PLT 56 (L) 11/03/2019   GLUCOSE 137 (H) 11/03/2019   CHOL 122 02/15/2019   TRIG 85 02/15/2019   HDL 46 02/15/2019   LDLCALC 59 02/15/2019   ALT 54 (H) 11/01/2019   AST 102 (H) 11/01/2019   NA 136 11/03/2019   K 4.5 11/03/2019   CL 103 11/03/2019   CREATININE 4.37 (H) 11/03/2019   BUN 42 (H) 11/03/2019   CO2 20 (L) 11/03/2019   TSH 5.113 (H) 05/25/2016   INR 1.4 (H)  10/28/2019   HGBA1C 6.2 (H) 10/27/2019   Fever today- lines removed  Not currently not on regular antibiotics Check cultures-DG Chest 1 View  Result Date: 11/03/2019 CLINICAL DATA:  Status post open heart surgery EXAM: CHEST  1 VIEW COMPARISON:  11/01/2019 FINDINGS: Right jugular central line and left-sided dialysis catheter are again seen and stable. Postsurgical changes are seen with left-sided thoracostomy catheter. No pneumothorax is noted. Persistent left basilar atelectasis and minimal effusion is seen. No new focal infiltrate is noted. Vascular congestion has improved in the interval. No bony abnormality is noted. IMPRESSION: Left basilar atelectasis and small effusion with chest tube in place. No pneumothorax is noted. Tubes and lines as described. Electronically Signed   By: Inez Catalina M.D.   On: 11/03/2019 09:07    Grace Isaac MD  Beeper [phone] Office [phone] 11/03/2019 4:40 PM

## 2019-11-03 NOTE — Progress Notes (Signed)
[facility] Progress Note  Subjective: seen in room, no SOB or new c/o  Vitals:   11/03/19 1345 11/03/19 1400 11/03/19 1415 11/03/19 1430  BP:      Pulse: 90 90 90 90  Resp: (!) 27 (!) 25 18 (!) 25  Temp:      TempSrc:      SpO2: 98% 100% 100% 100%  Weight:      Height:        Exam:  gen responsive, alert, not in distress, nasal O2   Chest - most clear, occ rhonchi    Cor no Rub or gallop    Abd soft obese    Ext 1-2+ edema UE's, no LE edema    Neuro awake, responsive    L IJ TDC ( LUA AVG not in use/ clotted)    Dialysis: [name] MWF   4h  99.5  2K/3.5Ca  P4  TDC   Hep 3000  - mircera 75 last 3/22  - hect 1   Assessment/ Plan: 1. CP/ ACS/ prior CABG '97 - sp CABG on 4/1 2. Cardiogenic shock - resolved  3. ESRD - usual HD MWF, sp CRRT 4/1 - 4/7. Now iHD, next HD Friday.  4. Volume - vol excess improved, still UE edema 5. Resp - stable on [place] O2 6. Anemia abl/ ckd - tsat 16%, ferr 1293.  Last esa was OP on 3/22, next dose due around 4/5 but  Hb > 11, no esa for now.  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   Inpatient medications: . aspirin  81 mg Oral Daily  . bisacodyl  10 mg Oral Daily   Or  . bisacodyl  10 mg Rectal Daily  . Chlorhexidine Gluconate Cloth  6 each Topical Daily  . clopidogrel  75 mg Oral Daily  . docusate sodium  100 mg Oral Daily  . feeding supplement (PRO-STAT SUGAR FREE 64)  30 mL Per Tube BID  . insulin aspart  0-9 Units Subcutaneous Q4H  . mouth rinse  15 mL Mouth Rinse BID  . metoprolol tartrate  12.5 mg Oral BID   Or  . metoprolol tartrate  12.5 mg Per Tube BID  . multivitamin with  minerals  1 tablet Oral Daily  . pantoprazole  40 mg Oral Daily  . rosuvastatin  10 mg Oral q1800  . sodium chloride flush  10-40 mL Intracatheter Q12H  . sodium chloride flush  3 mL Intravenous Q12H   . sodium chloride Stopped (10/31/19 1721)  . sodium chloride Stopped (11/02/19 0825)  . amiodarone 30 mg/hr (11/03/19 1400)  . feeding supplement (VITAL 1.5 CAL) 45 mL/hr at 11/01/19 0600  . norepinephrine (LEVOPHED) Adult infusion 8 mcg/min (11/03/19 1400)  . sodium phosphate  Dextrose 5% IVPB Stopped (11/01/19 1709)   sodium chloride, sodium chloride, dextrose, fentaNYL (SUBLIMAZE) injection, heparin, levalbuterol, metoprolol tartrate, ondansetron (ZOFRAN) IV, oxyCODONE, sodium chloride flush, sodium chloride flush, traMADol

## 2019-11-04 ENCOUNTER — Inpatient Hospital Stay (HOSPITAL_COMMUNITY): Payer: Medicare HMO

## 2019-11-04 LAB — MAGNESIUM: Magnesium: 2.8 mg/dL — ABNORMAL HIGH (ref 1.7–2.4)

## 2019-11-04 LAB — GLUCOSE, CAPILLARY
Glucose-Capillary: 144 mg/dL — ABNORMAL HIGH (ref 70–99)
Glucose-Capillary: 143 mg/dL — ABNORMAL HIGH (ref 70–99)
Glucose-Capillary: 130 mg/dL — ABNORMAL HIGH (ref 70–99)
Glucose-Capillary: 171 mg/dL — ABNORMAL HIGH (ref 70–99)

## 2019-11-04 LAB — CBC
HCT: 31.3 % — ABNORMAL LOW (ref 39.0–52.0)
Hemoglobin: 9.6 g/dL — ABNORMAL LOW (ref 13.0–17.0)
MCH: 30.4 pg (ref 26.0–34.0)
MCHC: 30.7 g/dL (ref 30.0–36.0)
MCV: 99.1 fL (ref 80.0–100.0)
Platelets: 64 10*3/uL — ABNORMAL LOW (ref 150–400)
RBC: 3.16 MIL/uL — ABNORMAL LOW (ref 4.22–5.81)
RDW: 19.6 % — ABNORMAL HIGH (ref 11.5–15.5)
WBC: 10.9 10*3/uL — ABNORMAL HIGH (ref 4.0–10.5)
nRBC: 0.5 % — ABNORMAL HIGH (ref 0.0–0.2)

## 2019-11-04 LAB — RENAL FUNCTION PANEL
Albumin: 2.4 g/dL — ABNORMAL LOW (ref 3.5–5.0)
Anion gap: 13 (ref 5–15)
BUN: 62 mg/dL — ABNORMAL HIGH (ref 8–23)
CO2: 22 mmol/L (ref 22–32)
Calcium: 6.8 mg/dL — ABNORMAL LOW (ref 8.9–10.3)
Chloride: 100 mmol/L (ref 98–111)
Creatinine, Ser: 6.6 mg/dL — ABNORMAL HIGH (ref 0.61–1.24)
GFR calc Af Amer: 9 mL/min — ABNORMAL LOW (ref >60)
GFR calc non Af Amer: 8 mL/min — ABNORMAL LOW (ref >60)
Glucose, Bld: 158 mg/dL — ABNORMAL HIGH (ref 70–99)
Phosphorus: 2.2 mg/dL — ABNORMAL LOW (ref 2.5–4.6)
Potassium: 4.6 mmol/L (ref 3.5–5.1)
Sodium: 135 mmol/L (ref 135–145)

## 2019-11-04 LAB — EXPECTORATED SPUTUM ASSESSMENT W GRAM STAIN, RFLX TO RESP C

## 2019-11-04 LAB — ECHO INTRAOPERATIVE TEE
Height: 66.5 in
Weight: 3411.2 oz

## 2019-11-04 MED ORDER — NEPRO/CARBSTEADY PO LIQD
237.0000 mL | Freq: Two times a day (BID) | ORAL | Status: DC
  Administered 2019-11-04 (×2): 237 mL via ORAL

## 2019-11-04 MED ORDER — ALBUMIN HUMAN 25 % IV SOLN
INTRAVENOUS | Status: AC
  Administered 2019-11-04: 13:00:00 25 g via INTRAVENOUS
  Filled 2019-11-04: qty 200

## 2019-11-04 MED ORDER — HEPARIN SODIUM (PORCINE) 1000 UNIT/ML DIALYSIS: 1500.0000 [IU] | INTRAMUSCULAR | Status: DC | PRN

## 2019-11-04 MED ORDER — HEPARIN SODIUM (PORCINE) 1000 UNIT/ML IJ SOLN
INTRAMUSCULAR | Status: AC
  Administered 2019-11-04: 4200 [IU]
  Filled 2019-11-04: qty 3

## 2019-11-04 MED ORDER — HEPARIN SODIUM (PORCINE) 1000 UNIT/ML IJ SOLN
INTRAMUSCULAR | Status: AC
  Administered 2019-11-04: 1500 [IU] via INTRAVENOUS_CENTRAL
  Filled 2019-11-04: qty 5

## 2019-11-04 MED ORDER — ALBUMIN HUMAN 25 % IV SOLN: 25.0000 g | Freq: Once | INTRAVENOUS | Status: AC

## 2019-11-04 MED ORDER — VANCOMYCIN HCL IN DEXTROSE 1-5 GM/200ML-% IV SOLN
1000.0000 mg | Freq: Once | INTRAVENOUS | Status: AC
  Administered 2019-11-04: 1000 mg via INTRAVENOUS
  Filled 2019-11-04: qty 200

## 2019-11-04 MED ORDER — RENA-VITE PO TABS
1.0000 | ORAL_TABLET | Freq: Every day | ORAL | Status: DC
  Administered 2019-11-04 – 2019-11-17 (×14): 1 via ORAL
  Filled 2019-11-04 (×14): qty 1

## 2019-11-04 NOTE — Progress Notes (Signed)
Nutrition Follow-up  DOCUMENTATION CODES:   Not applicable  INTERVENTION:   Recommend advancing diet to solid food; recommend Regular diet with Fluid restriction at this time  Nepro Shake po BID, each supplement provides 425 kcal and 19 grams protein  Change standard MVI with Minerals to Renal-Specific MVI (Rena-Vit) daily at bedtime  No phosphorus binder required at this time given hypophosphatemia   NUTRITION DIAGNOSIS:   Inadequate oral intake related to acute illness as evidenced by NPO status.  Being addressed via diet advancement, supplements  GOAL:   Patient will meet greater than or equal to 90% of their needs  Progressing  MONITOR:   PO intake, Diet advancement, Supplement acceptance, Labs, Weight trends, Skin  REASON FOR ASSESSMENT:   Ventilator    ASSESSMENT:   64 yo male admitted with NSTEMI and found to have mutlivessel CAD and underwent CABG on 4/1, ESRD/HD requiring CRRT. PMH includes ESRD on HD s/p renal transplant, CAD s/p CABG, CHF, DM  3/29 Admission 3/30 Cardiac Cath 4/01 CABG with IABP placement (removed 4/3), Intubated 4/02 CRRT initiated 4/04 Extubated 4/05 Cortrak placed, tip in stomach; okay to feed gastric per RD discussion with Dr. Orvan Seen 4/07 CRRT discontinued, ileus, NPO, Cortrak pulled out by pt 4/08 CL diet in AM, FL diet in PM  Plan iHD today (MWF schedule as outpatient)  Diet advanced to CL yesterday, pt tolerated and diet advanced to FL. Pt with good appetite, recorded po intake 100%. Pt tolerating well, recommend advancing to solid food  Ileus resolved  EDW 99.5 kg; current weight 107 kg. Noted several weights below EDW since admission  Per chart review, pt on PhosLo binder as outpatient. Phosphorus currently low at 2.2, no need for binder therapy at present.   Labs: phosphorus 2.2 (L), potassium 4.6 (wdl), corrected calcium 8.1 (albumin 2.4), CBGs 103-156 (wdll controlled) Meds: MVI with Minerals   Diet Order:   Diet  Order            Diet full liquid Room service appropriate? Yes; Fluid consistency: Thin  Diet effective now              EDUCATION NEEDS:   No education needs have been identified at this time  Skin:  Skin Assessment: Skin Integrity Issues: Skin Integrity Issues:: DTI DTI: multiple toes  Last BM:  4/09  Height:   Ht Readings from Last 1 Encounters:  10/28/19 5' 6.5" (1.689 m)    Weight:   Wt Readings from Last 1 Encounters:  11/04/19 107 kg    BMI:  Body mass index is 37.5 kg/m.  Estimated Nutritional Needs:   Kcal:  2300-2700  Protein:  120-140 gm  Fluid:  1000 plus UOP   Kerman Passey MS, RDN, LDN, CNSC RD Pager Number and Weekend/On-Call After Hours Pager Located in [place]

## 2019-11-04 NOTE — Progress Notes (Signed)
Physical Therapy Treatment Patient Details Name: Patrick [name] MRN: [id number] DOB: 03-28-1956 Today's Date: 11/04/2019    History of Present Illness Pt is a 64 yo male s/p re-do CABGx3 with Aortic valve replacement. PMHx: ESRD MWF, HTN, PNA, T2DM, MI.    PT Comments    Patient fatigued and refusing to participate initially, then agreed to EOB only.  Able to participate with seated therex and RN bathing pt. Stood momentarily for linen change and cleaning perineal area.  Spouse arrived end of session and supportive.  Patient remains appropriate for CIR level rehab upon d/c.  PT to follow acutely.    Follow Up Recommendations  CIR     Equipment Recommendations  Other (comment)(to be assessed)    Recommendations for Other Services       Precautions / Restrictions Precautions Precautions: Fall;Sternal Precaution Comments: chest tube    Mobility  Bed Mobility Overal bed mobility: Needs Assistance Bed Mobility: Rolling;Sidelying to Sit;Sit to Supine Rolling: Mod assist Sidelying to sit: Mod assist     Sit to sidelying: Mod assist;+2 for physical assistance;+2 for safety/equipment General bed mobility comments: cues for precautions, technique, assist for trunk/legs  Transfers Overall transfer level: Needs assistance Equipment used: 2 person hand held assist Transfers: Sit to/from Stand Sit to Stand: Mod assist;+2 physical assistance;From elevated surface         General transfer comment: increased time, cues for momentum, stood for hygiene and bed change RN giving pt a bath; OOB deferred at pt request  Ambulation/Gait                 Stairs             Wheelchair Mobility    Modified Rankin (Stroke Patients Only)       Balance Overall balance assessment: Needs assistance Sitting-balance support: Feet supported Sitting balance-Leahy Scale: Fair Sitting balance - Comments: sat EOB about 10 minutes for RN to give bath, performed some seated therex                                     Cognition Arousal/Alertness: Lethargic Behavior During Therapy: WFL for tasks assessed/performed Overall Cognitive Status: Within Functional Limits for tasks assessed                                        Exercises General Exercises - Upper Extremity Shoulder Flexion: AAROM;Both;5 reps;Seated General Exercises - Lower Extremity Long Arc Quad: AROM;10 reps;Seated Toe Raises: AROM;10 reps;Seated    General Comments General comments (skin integrity, edema, etc.): on O2, off CRRT      Pertinent Vitals/Pain Pain Assessment: No/denies pain    Home Living                      Prior Function            PT Goals (current goals can now be found in the care plan section) Progress towards PT goals: Progressing toward goals    Frequency           PT Plan Current plan remains appropriate    Co-evaluation              AM-PAC PT "6 Clicks" Mobility   Outcome Measure  Help needed turning from your back to your side while in a  flat bed without using bedrails?: A Little Help needed moving from lying on your back to sitting on the side of a flat bed without using bedrails?: A Lot Help needed moving to and from a bed to a chair (including a wheelchair)?: A Lot Help needed standing up from a chair using your arms (e.g., wheelchair or bedside chair)?: A Lot Help needed to walk in hospital room?: Total Help needed climbing 3-5 steps with a railing? : Total 6 Click Score: 11    End of Session Equipment Utilized During Treatment: Oxygen Activity Tolerance: Patient limited by fatigue Patient left: in bed;with call bell/phone within reach   PT Visit Diagnosis: Other abnormalities of gait and mobility (R26.89);Muscle weakness (generalized) (M62.81)     Time: 3704-8889 PT Time Calculation (min) (ACUTE ONLY): 32 min  Charges:  $Therapeutic Exercise: 8-22 mins $Therapeutic Activity: 8-22 mins                      Patrick [name], [place] [facility] [phone] 11/04/2019    Reginia Naas 11/04/2019, 5:42 PM

## 2019-11-04 NOTE — Progress Notes (Signed)
[facility] Progress Note  Subjective: seen in room, BP's down and back on pressors now  Vitals:   11/04/19 1545 11/04/19 1600 11/04/19 1611 11/04/19 1616  BP: (!) 95/52 (!) 104/35  122/82  Pulse: 90 90 90 90  Resp: 20 (!) 24 19 (!) 29  Temp:   98.8 F (37.1 C)   TempSrc:   Axillary   SpO2: 100% 99% 100% 100%  Weight:      Height:        Exam:  gen responsive, alert, not in distress, nasal O2   Chest - most clear, occ rhonchi    Cor no Rub or gallop    Abd soft obese    Ext 1-2+ edema UE's, no LE edema    Neuro awake, responsive    L IJ TDC ( LUA AVG not in use/ clotted)    Dialysis: [name] MWF   4h  99.5  2K/3.5Ca  P4  TDC   Hep 3000  - mircera 75 last 3/22  - hect 1   Assessment/ Plan: 1. CP/ ACS/ prior CABG '97 - sp CABG on 4/1 2. Cardiogenic shock - recurrent , back on pressor support 3. ESRD - usual HD MWF, sp CRRT 4/1 - 4/7. Next HD Friday.  4. Volume - vol excess still, will try albumin to help vol down, started midodrine as well 5. Resp - stable on [place] O2 6. Anemia abl/ ckd - tsat 16%, ferr 1293.  Last esa was OP on 3/22, next dose due around 4/5 but  Hb > 11, no esa for now.  7. MBD ckd - hect 1ug tiw, phoslo ac when eating    Patrick [name] 11/04/2019, 5:12 PM   Recent Labs  Lab 11/02/19 0445 11/02/19 0445 11/03/19 0236 11/04/19 0312  K 4.5   < > 4.5 4.6  BUN 22   < > 42* 62*  CREATININE 2.40*   < > 4.37* 6.60*  CALCIUM 7.7*   < > 7.2* 6.8*  PHOS 2.1*  --   --  2.2*  HGB 10.9*   < > 10.0* 9.6*   < > = values in this interval not displayed.   Inpatient medications: . aspirin  81 mg Oral Daily  . bisacodyl  10 mg Oral Daily   Or  . bisacodyl  10 mg Rectal Daily  . Chlorhexidine Gluconate Cloth  6 each Topical Daily  . Chlorhexidine Gluconate Cloth  6 each Topical Q0600  . clopidogrel  75 mg Oral Daily  . docusate sodium  100 mg Oral Daily  . feeding supplement (NEPRO CARB STEADY)  237 mL Oral BID BM  . insulin aspart  0-9 Units  Subcutaneous TID AC & HS  . mouth rinse  15 mL Mouth Rinse BID  . metoprolol tartrate  12.5 mg Oral BID   Or  . metoprolol tartrate  12.5 mg Per Tube BID  . midodrine  10 mg Oral TID WC  . multivitamin  1 tablet Oral QHS  . pantoprazole  40 mg Oral Daily  . rosuvastatin  10 mg Oral q1800  . sodium chloride flush  10-40 mL Intracatheter Q12H  . sodium chloride flush  3 mL Intravenous Q12H   . sodium chloride Stopped (10/31/19 1721)  . sodium chloride Stopped (11/02/19 0825)  . amiodarone 30 mg/hr (11/04/19 1600)  . norepinephrine (LEVOPHED) Adult infusion 20 mcg/min (11/04/19 1600)  . sodium phosphate  Dextrose 5% IVPB Stopped (11/01/19 1709)   sodium chloride, sodium chloride,  acetaminophen, dextrose, fentaNYL (SUBLIMAZE) injection, heparin, [START ON 11/05/2019] heparin, levalbuterol, metoprolol tartrate, ondansetron (ZOFRAN) IV, oxyCODONE, sodium chloride flush, sodium chloride flush, traMADol

## 2019-11-04 NOTE — Progress Notes (Signed)
SLP Cancellation Note  Patient Details Name: Patrick [name] MRN: [id number] DOB: May 05, 1956   Cancelled treatment:       Reason Eval/Treat Not Completed: Patient at procedure or test/unavailable(Pt asleep and receiving HD at this time. SLP will follow up.)  Evoleth Nordmeyer I. Hardin Negus, [place], [place] Office number [phone] Pager [place] 11/04/2019, 2:02 PM

## 2019-11-05 DIAGNOSIS — I480 Paroxysmal atrial fibrillation: Secondary | ICD-10-CM

## 2019-11-05 DIAGNOSIS — I214 Non-ST elevation (NSTEMI) myocardial infarction: Secondary | ICD-10-CM | POA: Diagnosis not present

## 2019-11-05 DIAGNOSIS — E782 Mixed hyperlipidemia: Secondary | ICD-10-CM

## 2019-11-05 DIAGNOSIS — Z951 Presence of aortocoronary bypass graft: Secondary | ICD-10-CM | POA: Diagnosis not present

## 2019-11-05 LAB — RENAL FUNCTION PANEL
Albumin: 2.3 g/dL — ABNORMAL LOW (ref 3.5–5.0)
Albumin: 2.3 g/dL — ABNORMAL LOW (ref 3.5–5.0)
Anion gap: 13 (ref 5–15)
Anion gap: 13 (ref 5–15)
BUN: 37 mg/dL — ABNORMAL HIGH (ref 8–23)
BUN: 39 mg/dL — ABNORMAL HIGH (ref 8–23)
CO2: 25 mmol/L (ref 22–32)
CO2: 24 mmol/L (ref 22–32)
Calcium: 7 mg/dL — ABNORMAL LOW (ref 8.9–10.3)
Calcium: 6.8 mg/dL — ABNORMAL LOW (ref 8.9–10.3)
Chloride: 97 mmol/L — ABNORMAL LOW (ref 98–111)
Chloride: 97 mmol/L — ABNORMAL LOW (ref 98–111)
Creatinine, Ser: 5.7 mg/dL — ABNORMAL HIGH (ref 0.61–1.24)
Creatinine, Ser: 5.82 mg/dL — ABNORMAL HIGH (ref 0.61–1.24)
GFR calc Af Amer: 11 mL/min — ABNORMAL LOW (ref >60)
GFR calc Af Amer: 11 mL/min — ABNORMAL LOW (ref >60)
GFR calc non Af Amer: 10 mL/min — ABNORMAL LOW (ref >60)
GFR calc non Af Amer: 9 mL/min — ABNORMAL LOW (ref >60)
Glucose, Bld: 156 mg/dL — ABNORMAL HIGH (ref 70–99)
Glucose, Bld: 147 mg/dL — ABNORMAL HIGH (ref 70–99)
Phosphorus: 1.8 mg/dL — ABNORMAL LOW (ref 2.5–4.6)
Phosphorus: 2.2 mg/dL — ABNORMAL LOW (ref 2.5–4.6)
Potassium: 4.1 mmol/L (ref 3.5–5.1)
Potassium: 4.3 mmol/L (ref 3.5–5.1)
Sodium: 135 mmol/L (ref 135–145)
Sodium: 134 mmol/L — ABNORMAL LOW (ref 135–145)

## 2019-11-05 LAB — BPAM RBC
Blood Product Expiration Date: 202105082359
Blood Product Expiration Date: 202105102359
ISSUE DATE / TIME: 202104011431
ISSUE DATE / TIME: 202104061816
Unit Type and Rh: 5100
Unit Type and Rh: 5100

## 2019-11-05 LAB — TYPE AND SCREEN
ABO/RH(D): O POS
Antibody Screen: POSITIVE
Unit division: 0
Unit division: 0

## 2019-11-05 LAB — CBC
HCT: 28.8 % — ABNORMAL LOW (ref 39.0–52.0)
Hemoglobin: 9.1 g/dL — ABNORMAL LOW (ref 13.0–17.0)
MCH: 30.4 pg (ref 26.0–34.0)
MCHC: 31.6 g/dL (ref 30.0–36.0)
MCV: 96.3 fL (ref 80.0–100.0)
Platelets: 85 10*3/uL — ABNORMAL LOW (ref 150–400)
RBC: 2.99 MIL/uL — ABNORMAL LOW (ref 4.22–5.81)
RDW: 19.9 % — ABNORMAL HIGH (ref 11.5–15.5)
WBC: 10.8 10*3/uL — ABNORMAL HIGH (ref 4.0–10.5)
nRBC: 0.2 % (ref 0.0–0.2)

## 2019-11-05 LAB — MAGNESIUM: Magnesium: 2.1 mg/dL (ref 1.7–2.4)

## 2019-11-05 LAB — GLUCOSE, CAPILLARY
Glucose-Capillary: 146 mg/dL — ABNORMAL HIGH (ref 70–99)
Glucose-Capillary: 122 mg/dL — ABNORMAL HIGH (ref 70–99)
Glucose-Capillary: 159 mg/dL — ABNORMAL HIGH (ref 70–99)
Glucose-Capillary: 156 mg/dL — ABNORMAL HIGH (ref 70–99)
Glucose-Capillary: 131 mg/dL — ABNORMAL HIGH (ref 70–99)
Glucose-Capillary: 121 mg/dL — ABNORMAL HIGH (ref 70–99)

## 2019-11-05 MED ORDER — SODIUM CHLORIDE 0.9 % FOR CRRT: INTRAVENOUS_CENTRAL | Status: DC | PRN

## 2019-11-05 MED ORDER — PRISMASOL BGK 4/2.5 32-4-2.5 MEQ/L REPLACEMENT SOLN
Status: DC
  Administered 2019-11-10 – 2019-11-11 (×2): 1 via INTRAVENOUS_CENTRAL

## 2019-11-05 MED ORDER — AMIODARONE IV BOLUS ONLY 150 MG/100ML: 150.0000 mg | Freq: Once | INTRAVENOUS | Status: DC

## 2019-11-05 MED ORDER — AMIODARONE LOAD VIA INFUSION
150.0000 mg | Freq: Once | INTRAVENOUS | Status: AC
  Administered 2019-11-05: 08:00:00 150 mg via INTRAVENOUS
  Filled 2019-11-05: qty 83.34

## 2019-11-05 MED ORDER — HEPARIN SODIUM (PORCINE) 1000 UNIT/ML DIALYSIS
1000.0000 [IU] | INTRAMUSCULAR | Status: DC | PRN
  Administered 2019-11-06 (×2): 4200 [IU] via INTRAVENOUS_CENTRAL
  Administered 2019-11-10: 4000 [IU] via INTRAVENOUS_CENTRAL
  Filled 2019-11-05: qty 6
  Filled 2019-11-05: qty 4
  Filled 2019-11-05 (×3): qty 6
  Filled 2019-11-05: qty 2
  Filled 2019-11-05: qty 6
  Filled 2019-11-05: qty 4

## 2019-11-05 MED ORDER — PRISMASOL BGK 4/2.5 32-4-2.5 MEQ/L IV SOLN
INTRAVENOUS | Status: DC
  Administered 2019-11-10 – 2019-11-11 (×5): 1 mL via INTRAVENOUS_CENTRAL

## 2019-11-05 MED ORDER — DARBEPOETIN ALFA 60 MCG/0.3ML IJ SOSY
60.0000 ug | PREFILLED_SYRINGE | INTRAMUSCULAR | Status: DC
  Administered 2019-11-05 – 2019-11-19 (×3): 60 ug via SUBCUTANEOUS
  Filled 2019-11-05 (×4): qty 0.3

## 2019-11-05 MED ORDER — ALTEPLASE 2 MG IJ SOLR: 2.0000 mg | Freq: Once | INTRAMUSCULAR | Status: DC | PRN

## 2019-11-05 NOTE — Plan of Care (Signed)
  Problem: Education: Goal: Ability to demonstrate management of disease process will improve Outcome: Progressing Goal: Ability to verbalize understanding of medication therapies will improve Outcome: Progressing Goal: Individualized Educational Video(s) Outcome: Progressing   Problem: Activity: Goal: Capacity to carry out activities will improve Outcome: Progressing   Problem: Cardiac: Goal: Ability to achieve and maintain adequate cardiopulmonary perfusion will improve Outcome: Progressing   Problem: Education: Goal: Knowledge of General Education information will improve Description: Including pain rating scale, medication(s)/side effects and non-pharmacologic comfort measures Outcome: Progressing   Problem: Health Behavior/Discharge Planning: Goal: Ability to manage health-related needs will improve Outcome: Progressing   Problem: Clinical Measurements: Goal: Ability to maintain clinical measurements within normal limits will improve Outcome: Progressing Goal: Will remain free from infection Outcome: Progressing Goal: Diagnostic test results will improve Outcome: Progressing Goal: Respiratory complications will improve Outcome: Progressing Goal: Cardiovascular complication will be avoided Outcome: Progressing   Problem: Activity: Goal: Risk for activity intolerance will decrease Outcome: Progressing   Problem: Nutrition: Goal: Adequate nutrition will be maintained Outcome: Progressing   Problem: Coping: Goal: Level of anxiety will decrease Outcome: Progressing   Problem: Elimination: Goal: Will not experience complications related to bowel motility Outcome: Progressing   Problem: Pain Managment: Goal: General experience of comfort will improve Outcome: Progressing   Problem: Safety: Goal: Ability to remain free from injury will improve Outcome: Progressing   Problem: Skin Integrity: Goal: Risk for impaired skin integrity will decrease Outcome:  Progressing   Problem: Education: Goal: Will demonstrate proper wound care and an understanding of methods to prevent future damage Outcome: Progressing Goal: Knowledge of disease or condition will improve Outcome: Progressing Goal: Knowledge of the prescribed therapeutic regimen will improve Outcome: Progressing Goal: Individualized Educational Video(s) Outcome: Progressing   Problem: Activity: Goal: Risk for activity intolerance will decrease Outcome: Progressing   Problem: Cardiac: Goal: Will achieve and/or maintain hemodynamic stability Outcome: Progressing   Problem: Clinical Measurements: Goal: Postoperative complications will be avoided or minimized Outcome: Progressing   Problem: Respiratory: Goal: Respiratory status will improve Outcome: Progressing   Problem: Skin Integrity: Goal: Wound healing without signs and symptoms of infection Outcome: Progressing Goal: Risk for impaired skin integrity will decrease Outcome: Progressing

## 2019-11-05 NOTE — Progress Notes (Signed)
[facility] Progress Note  Subjective: seen in room, sig SOB this am. 1.3 L off w/ HD yest  Vitals:   11/05/19 0500 11/05/19 0800 11/05/19 0830 11/05/19 0845  BP:  (!) 81/60  (!) 111/46  Pulse:   72 78  Resp:  (!) 35 (!) 23 (!) 41  Temp:      TempSrc:      SpO2:   (!) 88% 98%  Weight: 104.9 kg     Height:        Exam:  gen responsive, alert, appears SOB   Chest - most clear, occ rhonchi    Cor no Rub or gallop    Abd soft obese    Ext 1-2+ bilat edema UE's, facial edema, no LE edema    Neuro awake, responsive    L IJ TDC ( LUA AVG not in use/ clotted)    Dialysis: [name] MWF   4h  99.5  2K/3.5Ca  P4  TDC   Hep 3000  - mircera 75 last 3/22  - hect 1   Assessment/ Plan: 1. CP/ ACS/ prior CABG '97 - sp CABG on 4/1 2. Cardiogenic shock - recurrent , back on pressors 3. ESRD - usual HD MWF, sp CRRT 4/1 - 4/7. Min UF on HD d/t low BP's, on pressors. Today is more SOB, resuming CRRT see if we can get volume down 4. Anemia abl/ ckd - tsat 16%, ferr 1293.  Due for esa on 4/5, will start darbe 60 qwk SQ.  5. MBD ckd - hect 1ug tiw, phoslo ac when eating    Patrick [name] 11/05/2019, 10:42 AM   Recent Labs  Lab 11/04/19 0312 11/05/19 0343  K 4.6 4.1  BUN 62* 37*  CREATININE 6.60* 5.70*  CALCIUM 6.8* 7.0*  PHOS 2.2* 1.8*  HGB 9.6* 9.1*   Inpatient medications: . aspirin  81 mg Oral Daily  . bisacodyl  10 mg Oral Daily   Or  . bisacodyl  10 mg Rectal Daily  . Chlorhexidine Gluconate Cloth  6 each Topical Daily  . clopidogrel  75 mg Oral Daily  . docusate sodium  100 mg Oral Daily  . feeding supplement (NEPRO CARB STEADY)  237 mL Oral BID BM  . insulin aspart  0-9 Units Subcutaneous TID AC & HS  . mouth rinse  15 mL Mouth Rinse BID  . metoprolol tartrate  12.5 mg Oral BID   Or  . metoprolol tartrate  12.5 mg Per Tube BID  . midodrine  10 mg Oral TID WC  . multivitamin  1 tablet Oral QHS  . pantoprazole  40 mg Oral Daily  . rosuvastatin  10 mg Oral  q1800  . sodium chloride flush  10-40 mL Intracatheter Q12H  . sodium chloride flush  3 mL Intravenous Q12H   .  prismasol BGK 4/2.5    .  prismasol BGK 4/2.5    . sodium chloride Stopped (10/31/19 1721)  . sodium chloride Stopped (11/02/19 0825)  . amiodarone 30 mg/hr (11/05/19 0810)  . norepinephrine (LEVOPHED) Adult infusion 19 mcg/min (11/05/19 0400)  . prismasol BGK 4/2.5     sodium chloride, sodium chloride, acetaminophen, alteplase, dextrose, fentaNYL (SUBLIMAZE) injection, heparin, levalbuterol, metoprolol tartrate, ondansetron (ZOFRAN) IV, oxyCODONE, sodium chloride, sodium chloride flush, sodium chloride flush, traMADol

## 2019-11-05 NOTE — Progress Notes (Signed)
Progress Note  Patient Name: Patrick [name] Date of Encounter: 11/05/2019  Primary Cardiologist: Dorris Carnes, MD   Subjective   64 year old gentleman with a history of coronary artery disease, end-stage renal disease on dialysis.  He was admitted for redo bypass grafting and aortic valve replacement. He is now 9 days postop his surgery.  He still having lots of respiratory distress.  He is coughing.  He is having intermittent episodes of atrial fibrillation and has been started on amiodarone drip. He is having intermittent atrial fibrillation with aberrant conduction versus brief episodes of nonsustained ventricular tachycardia.  Potassium is 4.1.   Inpatient Medications    Scheduled Meds: . amiodarone  150 mg Intravenous Once  . aspirin  81 mg Oral [name]  . bisacodyl  10 mg Oral [name]   Or  . bisacodyl  10 mg Rectal [name]  . Chlorhexidine Gluconate Cloth  6 each Topical [name]  . Chlorhexidine Gluconate Cloth  6 each Topical Q0600  . clopidogrel  75 mg Oral [name]  . docusate sodium  100 mg Oral [name]  . feeding supplement (NEPRO CARB STEADY)  237 mL Oral BID BM  . insulin aspart  0-9 Units Subcutaneous TID AC & HS  . mouth rinse  15 mL Mouth Rinse BID  . metoprolol tartrate  12.5 mg Oral BID   Or  . metoprolol tartrate  12.5 mg Per Tube BID  . midodrine  10 mg Oral TID WC  . multivitamin  1 tablet Oral QHS  . pantoprazole  40 mg Oral [name]  . rosuvastatin  10 mg Oral q1800  . sodium chloride flush  10-40 mL Intracatheter Q12H  . sodium chloride flush  3 mL Intravenous Q12H   Continuous Infusions: . sodium chloride Stopped (10/31/19 1721)  . sodium chloride Stopped (11/02/19 0825)  . amiodarone 30 mg/hr (11/05/19 0810)  . norepinephrine (LEVOPHED) Adult infusion 19 mcg/min (11/05/19 0400)  . sodium phosphate  Dextrose 5% IVPB Stopped (11/01/19 1709)   PRN Meds: sodium chloride, sodium chloride, acetaminophen, dextrose, fentaNYL (SUBLIMAZE) injection, heparin, heparin,  levalbuterol, metoprolol tartrate, ondansetron (ZOFRAN) IV, oxyCODONE, sodium chloride flush, sodium chloride flush, traMADol   Vital Signs    Vitals:   11/05/19 0400 11/05/19 0415 11/05/19 0430 11/05/19 0500  BP: (!) 87/50 (!) 101/55 101/61   Pulse: 90 90 86   Resp: (!) 40 (!) 24 (!) 28   Temp: 98.2 F (36.8 C)     TempSrc: Oral     SpO2: 98% 97% 97%   Weight:    104.9 kg  Height:        Intake/Output Summary (Last 24 hours) at 11/05/2019 0819 Last data filed at 11/05/2019 0400 Gross per 24 hour  Intake 899.62 ml  Output 1373 ml  Net -473.38 ml   Last 3 Weights 11/05/2019 11/04/2019 11/04/2019  Weight (lbs) 231 lb 4.2 oz 235 lb 14.3 oz 235 lb 14.3 oz  Weight (kg) 104.9 kg 107 kg 107 kg      Telemetry    Normal sinus rhythm with episodes of atrial fibrillation.  He has intermittent episodes of aberrant conduction versus very brief episodes-4-5 beats-nonsustained ventricular tachycardia.- Personally Reviewed  ECG     - Personally Reviewed  Physical Exam   GEN:  Middle-aged gentleman, he is quite uncomfortable.  He is coughing he is in quite a bit of pain..   Neck: No JVD Cardiac: RRR, sternotomy incision appears to be dry and noninfected. Respiratory:  Scattered rhonchi bilaterally.  He is actively coughing between breaths. GI: Soft, nontender, non-distended  MS: No edema; No deformity. Neuro:  Nonfocal  Psych: Normal affect   Labs    High Sensitivity Troponin:   Recent Labs  Lab 10/24/19 0737  TROPONINIHS 2,995*      Chemistry Recent Labs  Lab 11/01/19 0751 11/01/19 0803 11/02/19 0445 11/02/19 0445 11/03/19 0236 11/04/19 0312 11/05/19 0343  NA 138   < > 138   < > 136 135 135  K 4.2   < > 4.5   < > 4.5 4.6 4.1  CL 104   < > 103   < > 103 100 97*  CO2 25   < > 23   < > 20* 22 25  GLUCOSE 139*   < > 139*   < > 137* 158* 156*  BUN 23   < > 22   < > 42* 62* 37*  CREATININE 2.35*   < > 2.40*   < > 4.37* 6.60* 5.70*  CALCIUM 7.4*   < > 7.7*   < > 7.2* 6.8*  7.0*  PROT 5.2*  --   --   --   --   --   --   ALBUMIN 2.3*   < > 2.7*  --   --  2.4* 2.3*  AST 102*  --   --   --   --   --   --   ALT 54*  --   --   --   --   --   --   ALKPHOS 67  --   --   --   --   --   --   BILITOT 0.9  --   --   --   --   --   --   GFRNONAA 28*   < > 27*   < > 13* 8* 10*  GFRAA 32*   < > 32*   < > 15* 9* 11*  ANIONGAP 9   < > 12   < > 13 13 13    < > = values in this interval not displayed.     Hematology Recent Labs  Lab 11/03/19 0236 11/04/19 0312 11/05/19 0343  WBC 11.9* 10.9* 10.8*  RBC 3.28* 3.16* 2.99*  HGB 10.0* 9.6* 9.1*  HCT 31.9* 31.3* 28.8*  MCV 97.3 99.1 96.3  MCH 30.5 30.4 30.4  MCHC 31.3 30.7 31.6  RDW 19.2* 19.6* 19.9*  PLT 56* 64* 85*    BNPNo results for input(s): BNP, PROBNP in the last 168 hours.   DDimer No results for input(s): DDIMER in the last 168 hours.   Radiology    DG Chest 1 View  Result Date: 11/04/2019 CLINICAL DATA:  Shortness of breath. EXAM: CHEST  1 VIEW COMPARISON:  11/03/2019. FINDINGS: Right PICC line, left IJ dual-lumen catheter in stable position. Prior CABG. Prior cardiac valve replacement. Cardiomegaly. Postsurgical changes right chest. Low lung volumes with bibasilar atelectasis/infiltrates. Small left pleural effusion. No pneumothorax. IMPRESSION: 1.  Right PICC line, left IJ dual-lumen catheter in stable position. 2.  Prior CABG.  Prior cardiac valve replacement.  Cardiomegaly. 3. Postsurgical changes right chest. Low lung volumes with bibasilar atelectasis/infiltrates. Small left pleural effusion. Electronically Signed   By: Marcello Moores  Register   On: 11/04/2019 09:53    Cardiac Studies     Patient Profile     64 y.o. male with redo [place] coronary artery bypass grafting.  He also had aortic valve replacement  Assessment &  Plan    1.  Coronary artery disease: He status post redo coronary artery bypass grafting.  He is nonbased postop surgery.  He is having a rough recovery from a pulmonary  standpoint.  2.  Status post aortic valve replacement  3.  End-stage renal disease-currently on dialysis.  4.  Paroxysmal atrial fibrillation.  Agree with IV amiodarone for now. He is not currently on full dose anticoagulation .   5.  Hyperlipidemia: Continue rosuvastatin.      For questions or updates, please contact [place] Please consult [email or web address] for contact info under        Signed, Mertie Moores, MD  11/05/2019, 8:19 AM

## 2019-11-05 NOTE — Progress Notes (Signed)
9 Days Post-Op Procedure(s) (LRB): REDO CORONARY ARTERY BYPASS GRAFTING TIMES 3  USING LEFT GREATER SAPHENOUS LEG VEIN HARVESTED ENDOSCOPICALLY(CABG) (N/A) TRANSESOPHAGEAL ECHOCARDIOGRAM (TEE) (N/A) AORTIC VALVE REPLACEMENT (AVR) USING EDWARDS INTUITY 23 MM AORTIC VALVE. (N/A) Endovein Harvest Of Greater Saphenous Vein (Left) Subjective: Feeling tired  Objective: Vital signs in last 24 hours: Temp:  [98.2 F (36.8 C)-100.7 F (38.2 C)] 98.2 F (36.8 C) (04/10 0400) Pulse Rate:  [84-96] 86 (04/10 0430) Cardiac Rhythm: A-V Sequential paced (04/09 2000) Resp:  [18-48] 28 (04/10 0430) BP: (42-145)/(13-131) 101/61 (04/10 0430) SpO2:  [93 %-100 %] 97 % (04/10 0430) Weight:  [104.9 kg-107 kg] 104.9 kg (04/10 0500)  Hemodynamic parameters for last 24 hours:    Intake/Output from previous day: 04/09 0701 - 04/10 0700 In: 930 [I.V.:729.8; IV Piggyback:200.2] Out: 1373 [Stool:50] Intake/Output this shift: No intake/output data recorded.  General appearance: alert and cooperative Neurologic: intact Heart: irregularly irregular rhythm Lungs: clear to auscultation bilaterally Abdomen: mildly distended Extremities: edema 2+ in UEs Wound: c/d/i  Lab Results: Recent Labs    11/04/19 0312 11/05/19 0343  WBC 10.9* 10.8*  HGB 9.6* 9.1*  HCT 31.3* 28.8*  PLT 64* 85*   BMET:  Recent Labs    11/04/19 0312 11/05/19 0343  NA 135 135  K 4.6 4.1  CL 100 97*  CO2 22 25  GLUCOSE 158* 156*  BUN 62* 37*  CREATININE 6.60* 5.70*  CALCIUM 6.8* 7.0*    PT/INR: No results for input(s): LABPROT, INR in the last 72 hours. ABG    Component Value Date/Time   PHART 7.416 11/01/2019 0803   HCO3 29.2 (H) 11/01/2019 0803   TCO2 31 11/01/2019 0803   ACIDBASEDEF 4.0 (H) 10/28/2019 1825   O2SAT 99.0 11/01/2019 0803   CBG (last 3)  Recent Labs    11/04/19 1556 11/04/19 2157 11/05/19 0635  GLUCAP 130* 171* 146*    Assessment/Plan: S/P Procedure(s) (LRB): REDO CORONARY ARTERY BYPASS  GRAFTING TIMES 3  USING LEFT GREATER SAPHENOUS LEG VEIN HARVESTED ENDOSCOPICALLY(CABG) (N/A) TRANSESOPHAGEAL ECHOCARDIOGRAM (TEE) (N/A) AORTIC VALVE REPLACEMENT (AVR) USING EDWARDS INTUITY 23 MM AORTIC VALVE. (N/A) Endovein Harvest Of Greater Saphenous Vein (Left) Mobilize amiodarone bolus  PT/OT   LOS: 12 days    Wonda Olds 11/05/2019

## 2019-11-06 ENCOUNTER — Inpatient Hospital Stay (HOSPITAL_COMMUNITY): Payer: Medicare HMO

## 2019-11-06 DIAGNOSIS — I214 Non-ST elevation (NSTEMI) myocardial infarction: Secondary | ICD-10-CM | POA: Diagnosis not present

## 2019-11-06 LAB — CBC
HCT: 32.3 % — ABNORMAL LOW (ref 39.0–52.0)
Hemoglobin: 10.1 g/dL — ABNORMAL LOW (ref 13.0–17.0)
MCH: 30.4 pg (ref 26.0–34.0)
MCHC: 31.3 g/dL (ref 30.0–36.0)
MCV: 97.3 fL (ref 80.0–100.0)
Platelets: 102 10*3/uL — ABNORMAL LOW (ref 150–400)
RBC: 3.32 MIL/uL — ABNORMAL LOW (ref 4.22–5.81)
RDW: 20.2 % — ABNORMAL HIGH (ref 11.5–15.5)
WBC: 15.5 10*3/uL — ABNORMAL HIGH (ref 4.0–10.5)
nRBC: 0.1 % (ref 0.0–0.2)

## 2019-11-06 LAB — RENAL FUNCTION PANEL
Albumin: 2.4 g/dL — ABNORMAL LOW (ref 3.5–5.0)
Albumin: 2.1 g/dL — ABNORMAL LOW (ref 3.5–5.0)
Anion gap: 13 (ref 5–15)
Anion gap: 15 (ref 5–15)
BUN: 21 mg/dL (ref 8–23)
BUN: 24 mg/dL — ABNORMAL HIGH (ref 8–23)
CO2: 25 mmol/L (ref 22–32)
CO2: 20 mmol/L — ABNORMAL LOW (ref 22–32)
Calcium: 7.6 mg/dL — ABNORMAL LOW (ref 8.9–10.3)
Calcium: 7.1 mg/dL — ABNORMAL LOW (ref 8.9–10.3)
Chloride: 98 mmol/L (ref 98–111)
Chloride: 99 mmol/L (ref 98–111)
Creatinine, Ser: 3.42 mg/dL — ABNORMAL HIGH (ref 0.61–1.24)
Creatinine, Ser: 4 mg/dL — ABNORMAL HIGH (ref 0.61–1.24)
GFR calc Af Amer: 21 mL/min — ABNORMAL LOW (ref >60)
GFR calc Af Amer: 17 mL/min — ABNORMAL LOW (ref >60)
GFR calc non Af Amer: 18 mL/min — ABNORMAL LOW (ref >60)
GFR calc non Af Amer: 15 mL/min — ABNORMAL LOW (ref >60)
Glucose, Bld: 125 mg/dL — ABNORMAL HIGH (ref 70–99)
Glucose, Bld: 139 mg/dL — ABNORMAL HIGH (ref 70–99)
Phosphorus: 1.8 mg/dL — ABNORMAL LOW (ref 2.5–4.6)
Phosphorus: 2.5 mg/dL (ref 2.5–4.6)
Potassium: 4.4 mmol/L (ref 3.5–5.1)
Potassium: 4.8 mmol/L (ref 3.5–5.1)
Sodium: 136 mmol/L (ref 135–145)
Sodium: 134 mmol/L — ABNORMAL LOW (ref 135–145)

## 2019-11-06 LAB — GLUCOSE, CAPILLARY
Glucose-Capillary: 88 mg/dL (ref 70–99)
Glucose-Capillary: 92 mg/dL (ref 70–99)
Glucose-Capillary: 108 mg/dL — ABNORMAL HIGH (ref 70–99)

## 2019-11-06 LAB — CULTURE, RESPIRATORY W GRAM STAIN: Culture: NORMAL

## 2019-11-06 LAB — MAGNESIUM: Magnesium: 2.3 mg/dL (ref 1.7–2.4)

## 2019-11-06 LAB — APTT: aPTT: 47 seconds — ABNORMAL HIGH (ref 24–36)

## 2019-11-06 MED ORDER — VANCOMYCIN HCL 2000 MG/400ML IV SOLN
2000.0000 mg | Freq: Once | INTRAVENOUS | Status: AC
  Administered 2019-11-06: 2000 mg via INTRAVENOUS
  Filled 2019-11-06: qty 400

## 2019-11-06 MED ORDER — SODIUM CHLORIDE 0.9 % IV SOLN
1.0000 g | Freq: Three times a day (TID) | INTRAVENOUS | Status: DC
  Administered 2019-11-06 – 2019-11-07 (×4): 1 g via INTRAVENOUS
  Filled 2019-11-06 (×6): qty 1

## 2019-11-06 MED ORDER — APIXABAN 5 MG PO TABS
5.0000 mg | ORAL_TABLET | Freq: Two times a day (BID) | ORAL | Status: DC
  Administered 2019-11-06 – 2019-11-09 (×8): 5 mg via ORAL
  Filled 2019-11-06 (×8): qty 1

## 2019-11-06 MED ORDER — VANCOMYCIN HCL 1250 MG/250ML IV SOLN
1250.0000 mg | INTRAVENOUS | Status: DC
  Administered 2019-11-07: 1250 mg via INTRAVENOUS
  Filled 2019-11-06: qty 250

## 2019-11-06 MED ORDER — SODIUM CHLORIDE 0.9 % IV SOLN
500.0000 [IU]/h | INTRAVENOUS | Status: DC
  Administered 2019-11-06 – 2019-11-10 (×5): 500 [IU]/h via INTRAVENOUS_CENTRAL
  Filled 2019-11-06 (×5): qty 2

## 2019-11-06 MED ORDER — ALBUMIN HUMAN 5 % IV SOLN
12.5000 g | Freq: Once | INTRAVENOUS | Status: AC
  Administered 2019-11-06: 12.5 g via INTRAVENOUS
  Filled 2019-11-06: qty 250

## 2019-11-06 MED ORDER — CHLORHEXIDINE GLUCONATE CLOTH 2 % EX PADS
6.0000 | MEDICATED_PAD | Freq: Every day | CUTANEOUS | Status: DC
  Administered 2019-11-07 – 2019-11-19 (×11): 6 via TOPICAL

## 2019-11-06 NOTE — Progress Notes (Signed)
  Speech Language Pathology Treatment: Dysphagia  Patient Details Name: Patrick [name] MRN: [id number] DOB: 1955/09/21 Today's Date: 11/06/2019 Time: 3893-7342 SLP Time Calculation (min) (ACUTE ONLY): 19 min  Assessment / Plan / Recommendation Clinical Impression  Pt on SLP's work list to be seen today; RN paged to see if pt able to upgrade from full liquids. Mr. Budai repositioned with assist from student RN focusing on efficiency of mastication. Two-three consecutive cup sips with water consumed without s/s aspiration- used straw once however he prefers cups. Increased work of breathing mildly which he was able to pace and self regulate. Brisk mastication and transit with solid texture. Increased diet texture to Dys 3 (mechanical soft) to assist with energy conversation. Reviewed importance of upright position, slow pace, rest breaks when needed. Will plan to follow for tolerance and upgrade from Dys 3 when able.    HPI HPI: 64 year old male with PMH of CABG in 1997 s/p stent, ESRD on HD s/p renal transplant, presented to [facility] 3/29 with CP, SOB. Dx NSTEMI and multivessel CAD. Underwent 3vCABG and AVR 4/1 and balloon pump placement. Intubated 4/1 for surgery, extubated 4/4.       SLP Plan  Continue with current plan of care       Recommendations  Diet recommendations: Dysphagia 3 (mechanical soft);Thin liquid Liquids provided via: Cup;Straw Medication Administration: Whole meds with liquid Supervision: Patient able to self feed Compensations: Slow rate;Small sips/bites;Other (Comment)(rest breaks when needed) Postural Changes and/or Swallow Maneuvers: Seated upright 90 degrees                General recommendations: Rehab consult Oral Care Recommendations: Oral care BID Follow up Recommendations: Inpatient Rehab SLP Visit Diagnosis: Dysphagia, unspecified (R13.10) Plan: Continue with current plan of care                       Houston Siren 11/06/2019, 10:21  AM  Orbie Pyo Colvin Caroli.Ed Risk analyst [phone] Office [phone]

## 2019-11-06 NOTE — Plan of Care (Signed)
  Problem: Education: Goal: Ability to demonstrate management of disease process will improve Outcome: Progressing Goal: Ability to verbalize understanding of medication therapies will improve Outcome: Progressing Goal: Individualized Educational Video(s) Outcome: Progressing   Problem: Activity: Goal: Capacity to carry out activities will improve Outcome: Progressing   Problem: Cardiac: Goal: Ability to achieve and maintain adequate cardiopulmonary perfusion will improve Outcome: Progressing   Problem: Education: Goal: Knowledge of General Education information will improve Description: Including pain rating scale, medication(s)/side effects and non-pharmacologic comfort measures Outcome: Progressing   Problem: Health Behavior/Discharge Planning: Goal: Ability to manage health-related needs will improve Outcome: Progressing   Problem: Clinical Measurements: Goal: Ability to maintain clinical measurements within normal limits will improve Outcome: Progressing Goal: Will remain free from infection Outcome: Progressing Goal: Diagnostic test results will improve Outcome: Progressing Goal: Respiratory complications will improve Outcome: Progressing Goal: Cardiovascular complication will be avoided Outcome: Progressing   Problem: Activity: Goal: Risk for activity intolerance will decrease Outcome: Progressing   Problem: Nutrition: Goal: Adequate nutrition will be maintained Outcome: Progressing   Problem: Coping: Goal: Level of anxiety will decrease Outcome: Progressing   Problem: Elimination: Goal: Will not experience complications related to bowel motility Outcome: Progressing   Problem: Pain Managment: Goal: General experience of comfort will improve Outcome: Progressing   Problem: Safety: Goal: Ability to remain free from injury will improve Outcome: Progressing   Problem: Skin Integrity: Goal: Risk for impaired skin integrity will decrease Outcome:  Progressing   Problem: Education: Goal: Will demonstrate proper wound care and an understanding of methods to prevent future damage Outcome: Progressing Goal: Knowledge of disease or condition will improve Outcome: Progressing Goal: Knowledge of the prescribed therapeutic regimen will improve Outcome: Progressing Goal: Individualized Educational Video(s) Outcome: Progressing   Problem: Activity: Goal: Risk for activity intolerance will decrease Outcome: Progressing   Problem: Cardiac: Goal: Will achieve and/or maintain hemodynamic stability Outcome: Progressing   Problem: Clinical Measurements: Goal: Postoperative complications will be avoided or minimized Outcome: Progressing   Problem: Respiratory: Goal: Respiratory status will improve Outcome: Progressing   Problem: Skin Integrity: Goal: Wound healing without signs and symptoms of infection Outcome: Progressing Goal: Risk for impaired skin integrity will decrease Outcome: Progressing

## 2019-11-06 NOTE — Progress Notes (Signed)
Progress Note  Patient Name: Patrick [name] Date of Encounter: 11/06/2019  Primary Cardiologist: Dorris Carnes, MD   Subjective   64 year old gentleman with a history of coronary artery disease, end-stage renal disease on dialysis.  He was admitted for redo bypass grafting and aortic valve replacement. He is now 9 days postop his surgery.  He still having lots of respiratory distress.  He is coughing.  He is having intermittent episodes of atrial fibrillation and has been started on amiodarone drip. He is having intermittent atrial fibrillation with aberrant conduction versus brief episodes of nonsustained ventricular tachycardia.  Potassium is 4.1.  Sleepy  Start CVVH yesterday     Inpatient Medications    Scheduled Meds: . apixaban  5 mg Oral BID  . aspirin  81 mg Oral [name]  . bisacodyl  10 mg Oral [name]   Or  . bisacodyl  10 mg Rectal [name]  . Chlorhexidine Gluconate Cloth  6 each Topical [name]  . clopidogrel  75 mg Oral [name]  . darbepoetin (ARANESP) injection - NON-DIALYSIS  60 mcg Subcutaneous Q Sat-1800  . docusate sodium  100 mg Oral [name]  . feeding supplement (NEPRO CARB STEADY)  237 mL Oral BID BM  . insulin aspart  0-9 Units Subcutaneous TID AC & HS  . mouth rinse  15 mL Mouth Rinse BID  . metoprolol tartrate  12.5 mg Oral BID   Or  . metoprolol tartrate  12.5 mg Per Tube BID  . midodrine  10 mg Oral TID WC  . multivitamin  1 tablet Oral QHS  . pantoprazole  40 mg Oral [name]  . rosuvastatin  10 mg Oral q1800  . sodium chloride flush  10-40 mL Intracatheter Q12H  . sodium chloride flush  3 mL Intravenous Q12H   Continuous Infusions: .  prismasol BGK 4/2.5 400 mL/hr at 11/06/19 0113  .  prismasol BGK 4/2.5 200 mL/hr at 11/05/19 1300  . sodium chloride Stopped (10/31/19 1721)  . sodium chloride 10 mL/hr at 11/06/19 0900  . amiodarone 30 mg/hr (11/06/19 0900)  . meropenem (MERREM) IV 1 g (11/06/19 0801)  . norepinephrine (LEVOPHED) Adult infusion 20 mcg/min  (11/06/19 0900)  . prismasol BGK 4/2.5 1,800 mL/hr at 11/06/19 0644  . vancomycin 2,000 mg (11/06/19 0901)   Followed by  . [START ON 11/07/2019] vancomycin     PRN Meds: sodium chloride, sodium chloride, acetaminophen, alteplase, dextrose, fentaNYL (SUBLIMAZE) injection, heparin, levalbuterol, metoprolol tartrate, ondansetron (ZOFRAN) IV, oxyCODONE, sodium chloride, sodium chloride flush, sodium chloride flush, traMADol   Vital Signs    Vitals:   11/06/19 0845 11/06/19 0900 11/06/19 0915 11/06/19 0930  BP: 104/71 (!) 89/53 (!) 108/53 (!) 80/50  Pulse: 92 92 92 92  Resp: 18 (!) 23 (!) 24 (!) 21  Temp:      TempSrc:      SpO2: 100% 100% 100% 100%  Weight:      Height:        Intake/Output Summary (Last 24 hours) at 11/06/2019 0950 Last data filed at 11/06/2019 0900 Gross per 24 hour  Intake 898.2 ml  Output 2769 ml  Net -1870.8 ml   Last 3 Weights 11/06/2019 11/05/2019 11/04/2019  Weight (lbs) 226 lb 6.6 oz 231 lb 4.2 oz 235 lb 14.3 oz  Weight (kg) 102.7 kg 104.9 kg 107 kg      Telemetry    Normal sinus rhythm with episodes of atrial fibrillation.  He has intermittent episodes of aberrant conduction versus very brief  episodes-4-5 beats-nonsustained ventricular tachycardia.- Personally Reviewed  ECG     - Personally Reviewed  Physical Exam    Physical Exam: Blood pressure (!) 80/50, pulse 92, temperature 99.2 F (37.3 C), temperature source Axillary, resp. rate (!) 21, height 5' 6.5" (1.689 m), weight 102.7 kg, SpO2 100 %.  GEN:  Obese, middle age male,  sleepy  HEENT: Normal NECK: No JVD; No carotid bruits LYMPHATICS: No lymphadenopathy CARDIAC: RRR  RESPIRATORY:  Clear to auscultation without rales, wheezing or rhonchi  ABDOMEN: Soft, non-tender, non-distended MUSCULOSKELETAL:  No edema; No deformity  SKIN: Warm and dry NEUROLOGIC:  Alert and oriented x 3   Labs    High Sensitivity Troponin:   Recent Labs  Lab 10/24/19 0737  TROPONINIHS 2,995*       Chemistry Recent Labs  Lab 11/01/19 0751 11/01/19 0803 11/05/19 0343 11/05/19 1613 11/06/19 0318  NA 138   < > 135 134* 136  K 4.2   < > 4.1 4.3 4.4  CL 104   < > 97* 97* 98  CO2 25   < > 25 24 25   GLUCOSE 139*   < > 156* 147* 125*  BUN 23   < > 37* 39* 21  CREATININE 2.35*   < > 5.70* 5.82* 3.42*  CALCIUM 7.4*   < > 7.0* 6.8* 7.6*  PROT 5.2*  --   --   --   --   ALBUMIN 2.3*   < > 2.3* 2.3* 2.4*  AST 102*  --   --   --   --   ALT 54*  --   --   --   --   ALKPHOS 67  --   --   --   --   BILITOT 0.9  --   --   --   --   GFRNONAA 28*   < > 10* 9* 18*  GFRAA 32*   < > 11* 11* 21*  ANIONGAP 9   < > 13 13 13    < > = values in this interval not displayed.     Hematology Recent Labs  Lab 11/04/19 0312 11/05/19 0343 11/06/19 0318  WBC 10.9* 10.8* 15.5*  RBC 3.16* 2.99* 3.32*  HGB 9.6* 9.1* 10.1*  HCT 31.3* 28.8* 32.3*  MCV 99.1 96.3 97.3  MCH 30.4 30.4 30.4  MCHC 30.7 31.6 31.3  RDW 19.6* 19.9* 20.2*  PLT 64* 85* 102*    BNPNo results for input(s): BNP, PROBNP in the last 168 hours.   DDimer No results for input(s): DDIMER in the last 168 hours.   Radiology    DG Chest Port 1 View  Result Date: 11/06/2019 CLINICAL DATA:  Open heart surgery. EXAM: PORTABLE CHEST 1 VIEW COMPARISON:  11/04/2019 FINDINGS: Left IJ venous catheter with tip over the right atrium unchanged. Right IJ central venous catheter with tip over the SVC unchanged. Lungs are adequately inflated with mild worsening opacification over the left mid to lower lung likely worsening effusion with possible basilar atelectasis. Mild stable density over the medial right base. Stable cardiomegaly. Remainder the exam is unchanged. IMPRESSION: 1. Worsening hazy opacification over the left base likely worsening effusion/atelectasis. Stable mild opacification over the medial right base. 2.  Stable moderate cardiomegaly. 3.  Tubes and lines unchanged. Electronically Signed   By: Marin Olp M.D.   On: 11/06/2019 08:16     Cardiac Studies     Patient Profile     64 y.o. male with redo [place] coronary  artery bypass grafting.  He also had aortic valve replacement  Assessment & Plan    1.  Coronary artery disease: He status post redo coronary artery bypass grafting.  Cont curre care   2.  Status post aortic valve replacement  3.  End-stage renal disease-further plans per nephrology   4.  Paroxysmal atrial fibrillation.   Cont amio  He is now on Eliquis 5 mg BID     5.  Hyperlipidemia: cont rosuvastatin   For questions or updates, please contact [place] Please consult [email or web address] for contact info under        Signed, Mertie Moores, MD  11/06/2019, 9:50 AM

## 2019-11-06 NOTE — Progress Notes (Signed)
10 Days Post-Op Procedure(s) (LRB): REDO CORONARY ARTERY BYPASS GRAFTING TIMES 3  USING LEFT GREATER SAPHENOUS LEG VEIN HARVESTED ENDOSCOPICALLY(CABG) (N/A) TRANSESOPHAGEAL ECHOCARDIOGRAM (TEE) (N/A) AORTIC VALVE REPLACEMENT (AVR) USING EDWARDS INTUITY 23 MM AORTIC VALVE. (N/A) Endovein Harvest Of Greater Saphenous Vein (Left) Subjective: States he's feeling better  Objective: Vital signs in last 24 hours: Temp:  [97.9 F (36.6 C)-98.6 F (37 C)] 98.5 F (36.9 C) (04/11 0300) Pulse Rate:  [55-92] 64 (04/11 0715) Cardiac Rhythm: Atrial fibrillation (04/10 1946) Resp:  [18-44] 22 (04/11 0715) BP: (57-130)/(25-95) 91/45 (04/11 0715) SpO2:  [82 %-100 %] 95 % (04/11 0715) Weight:  [102.7 kg] 102.7 kg (04/11 0500)  Hemodynamic parameters for last 24 hours:    Intake/Output from previous day: 04/10 0701 - 04/11 0700 In: 1077.8 [I.V.:1077.8] Out: 2769  Intake/Output this shift: No intake/output data recorded.  General appearance: alert, cooperative and fatigued Neurologic: intact Heart: regular rate and rhythm, S1, S2 normal, no murmur, click, rub or gallop Lungs: clear to auscultation bilaterally Abdomen: soft, non-tender; bowel sounds normal; no masses,  no organomegaly Extremities: extremities normal, atraumatic, no cyanosis or edema Wound: c/d/i  Lab Results: Recent Labs    11/05/19 0343 11/06/19 0318  WBC 10.8* 15.5*  HGB 9.1* 10.1*  HCT 28.8* 32.3*  PLT 85* 102*   BMET:  Recent Labs    11/05/19 1613 11/06/19 0318  NA 134* 136  K 4.3 4.4  CL 97* 98  CO2 24 25  GLUCOSE 147* 125*  BUN 39* 21  CREATININE 5.82* 3.42*  CALCIUM 6.8* 7.6*    PT/INR: No results for input(s): LABPROT, INR in the last 72 hours. ABG    Component Value Date/Time   PHART 7.416 11/01/2019 0803   HCO3 29.2 (H) 11/01/2019 0803   TCO2 31 11/01/2019 0803   ACIDBASEDEF 4.0 (H) 10/28/2019 1825   O2SAT 99.0 11/01/2019 0803   CBG (last 3)  Recent Labs    11/05/19 1532 11/05/19 2109  11/06/19 0641  GLUCAP 131* 121* 88    Assessment/Plan: S/P Procedure(s) (LRB): REDO CORONARY ARTERY BYPASS GRAFTING TIMES 3  USING LEFT GREATER SAPHENOUS LEG VEIN HARVESTED ENDOSCOPICALLY(CABG) (N/A) TRANSESOPHAGEAL ECHOCARDIOGRAM (TEE) (N/A) AORTIC VALVE REPLACEMENT (AVR) USING EDWARDS INTUITY 23 MM AORTIC VALVE. (N/A) Endovein Harvest Of Greater Saphenous Vein (Left) Mobilize Diuresis start abx for leukocytosis  PT/OT/CIR referrral   LOS: 13 days    Patrick [name] 11/06/2019

## 2019-11-06 NOTE — Progress Notes (Signed)
Patient on CRRT , has been tolerating well. Pt was hemodynamically stable until about  1645-1700. Pt started to override temp pacer with irregular rhythm with very frequent 3 beat runs of VTACH. Pt became hemodynamically unstable, w/ SBP dropping and maxed on pressers. Pt. Received all midodrine doses today as scheduled. Dr. Jonnie Finner and  Dr. Orvan Seen paged. Dr. Orvan Seen ordered to stop CRRT and administer 250cc albumin 5%. CRRT stopped and blood was returned to the patient from the set successfully. Patient pressure continued to decrease. Increased norepi in attempt to maintain adequate SBP.  Orvan Seen, MD aware and at the bedside.   No further actions at this time. CRRT to remain off for now per MD.   Luberta Mutter RN

## 2019-11-06 NOTE — Progress Notes (Signed)
Pharmacy Antibiotic Note  Patrick [name] is a 64 y.o. male admitted on 10/24/2019 for redo bypass grafting and aortic valve replacement. Now with recurrent fever and elevated WBC. He did receive one dose of vancomycin after HD on 4/9 but with improvement in CXR yesterday, antibiotics were not continued. However, with WBC elevated to 15.5 today, antibiotics have been requested to resume. Pharmacy has been consulted for vancomycin and meropenem dosing. Anticipated 1 week of therapy per Dr. Orvan Seen. Patient started CRRT yesterday.   Plan: Vancomycin 2,000 mg IV x 1 followed by vancomycin 1,250 mg IV every 24 hours.  Goal trough 15-20 mcg/mL. Meropenem 1 g IV q8h Monitor cultures, vancomycin levels, LOT, and CRRT duration  Height: 5' 6.5" (168.9 cm) Weight: 102.7 kg (226 lb 6.6 oz) IBW/kg (Calculated) : 64.95  Temp (24hrs), Avg:98.3 F (36.8 C), Min:97.9 F (36.6 C), Max:98.6 F (37 C)  Recent Labs  Lab  0000 11/01/19 0804 11/01/19 1151 11/01/19 1612 11/02/19 0445 11/02/19 0445 11/03/19 0236 11/04/19 0312 11/05/19 0343 11/05/19 1613 11/06/19 0318  WBC   < >  --   --   --  8.0  --  11.9* 10.9* 10.8*  --  15.5*  CREATININE  --   --   --    < > 2.40*   < > 4.37* 6.60* 5.70* 5.82* 3.42*  LATICACIDVEN  --  0.9 0.8  --   --   --   --   --   --   --   --    < > = values in this interval not displayed.    Estimated Creatinine Clearance: 24.4 mL/min (A) (by C-G formula based on SCr of 3.42 mg/dL (H)).    Allergies  Allergen Reactions  . Baclofen Other (See Comments)    Possibly stroke like symptoms  . Iodinated Diagnostic Agents Swelling, Rash and Other (See Comments)    Other Reaction: burning to mouth, swelling of lips  . Lipitor [Atorvastatin] Other (See Comments)    Leg pain  . Metoprolol Other (See Comments)    Headaches     Antimicrobials this admission: Vancomycin  4/9 x 1 Vancomycin 4/11 >>  Meropenem 4/11 >>  Dose adjustments this admission: None  Microbiology  results: 4/11 BCx: sent 4/8 sputum: reincubated 4/8 BCx: ngtd 3/31 MRSA: neg 3/29 COVID/flu: neg  Thank you for allowing pharmacy to be a part of this patient's care.  Vertis Kelch, PharmD, [facility] PGY2 Cardiology Pharmacy Resident Phone [phone] 11/06/2019       7:26 AM  Please check [email or web address] for unit-specific pharmacist phone numbers

## 2019-11-06 NOTE — Progress Notes (Addendum)
[facility] Progress Note  Subjective: seen in ICU, breathing better per pt. Net neg 1.7 L yesterday.   Vitals:   11/06/19 0845 11/06/19 0900 11/06/19 0915 11/06/19 0930  BP: 104/71 (!) 89/53 (!) 108/53 (!) 80/50  Pulse: 92 92 92 92  Resp: 18 (!) 23 (!) 24 (!) 21  Temp:      TempSrc:      SpO2: 100% 100% 100% 100%  Weight:      Height:        Exam:  gen responsive, alert, appears SOB   Chest - most clear, occ rhonchi    Cor no Rub or gallop    Abd soft obese    Ext 1-2+ bilat edema UE's, facial edema    Neuro awake, responsive    L IJ TDC ( LUA AVG not in use/ clotted)    Dialysis: [name] MWF   4h  99.5  2K/3.5Ca  P4  TDC   Hep 3000  - mircera 75 last 3/22  - hect 1   Assessment/ Plan: 1. CP/ ACS/ prior CABG '97 - sp CABG on 4/1 2. Cardiogenic shock - recurrent , back on pressors 3. Resp failure - suspected pulm edema d/t vol excess. Better.  4. ESRD - usual HD MWF, sp CRRT 4/1 - 4/7. Min UF on HD d/t low BP's. Responding to CRRT , pressor dose is stable, will ^UF try for goal net neg 2- 2.5 L / day.  Plts better, will start flat rate heparin into circuit 500 u /hr.  5. Anemia abl/ ckd - tsat 16%, ferr 1293.  Due for esa on 4/5,started darbe 60 qwk SQ 1st dose 4/10. 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   Inpatient medications: . apixaban  5 mg Oral BID  . aspirin  81 mg Oral Daily  . bisacodyl  10 mg Oral Daily   Or  . bisacodyl  10 mg Rectal Daily  . Chlorhexidine Gluconate Cloth  6 each Topical Daily  . clopidogrel  75 mg Oral Daily  . darbepoetin (ARANESP) injection - NON-DIALYSIS  60 mcg  Subcutaneous Q Sat-1800  . docusate sodium  100 mg Oral Daily  . feeding supplement (NEPRO CARB STEADY)  237 mL Oral BID BM  . insulin aspart  0-9 Units Subcutaneous TID AC & HS  . mouth rinse  15 mL Mouth Rinse BID  . metoprolol tartrate  12.5 mg Oral BID   Or  . metoprolol tartrate  12.5 mg Per Tube BID  . midodrine  10 mg Oral TID WC  . multivitamin  1 tablet Oral QHS  . pantoprazole  40 mg Oral Daily  . rosuvastatin  10 mg Oral q1800  . sodium chloride flush  10-40 mL Intracatheter Q12H  . sodium chloride flush  3 mL Intravenous Q12H   .  prismasol BGK 4/2.5 400 mL/hr at 11/06/19 0113  .  prismasol BGK 4/2.5 200 mL/hr at 11/05/19  1300  . sodium chloride Stopped (10/31/19 1721)  . sodium chloride 10 mL/hr at 11/06/19 0900  . amiodarone 30 mg/hr (11/06/19 0900)  . meropenem (MERREM) IV 1 g (11/06/19 0801)  . norepinephrine (LEVOPHED) Adult infusion 20 mcg/min (11/06/19 0900)  . prismasol BGK 4/2.5 1,800 mL/hr at 11/06/19 0644  . vancomycin 2,000 mg (11/06/19 0901)   Followed by  . [START ON 11/07/2019] vancomycin     sodium chloride, sodium chloride, acetaminophen, alteplase, dextrose, fentaNYL (SUBLIMAZE) injection, heparin, levalbuterol, metoprolol tartrate, ondansetron (ZOFRAN) IV, oxyCODONE, sodium chloride, sodium chloride flush, sodium chloride flush, traMADol

## 2019-11-06 NOTE — Progress Notes (Signed)
ANTICOAGULATION CONSULT NOTE - Initial Consult  Pharmacy Consult for apixaban Indication: atrial fibrillation  Allergies  Allergen Reactions  . Baclofen Other (See Comments)    Possibly stroke like symptoms  . Iodinated Diagnostic Agents Swelling, Rash and Other (See Comments)    Other Reaction: burning to mouth, swelling of lips  . Lipitor [Atorvastatin] Other (See Comments)    Leg pain  . Metoprolol Other (See Comments)    Headaches     Patient Measurements: Height: 5' 6.5" (168.9 cm) Weight: 102.7 kg (226 lb 6.6 oz) IBW/kg (Calculated) : 64.95  Vital Signs: Temp: 98.5 F (36.9 C) (04/11 0300) Temp Source: Axillary (04/11 0300) BP: 91/45 (04/11 0715) Pulse Rate: 64 (04/11 0715)  Labs: Recent Labs    11/04/19 0312 11/04/19 0312 11/05/19 0343 11/05/19 1613 11/06/19 0318  HGB 9.6*   < > 9.1*  --  10.1*  HCT 31.3*  --  28.8*  --  32.3*  PLT 64*  --  85*  --  102*  APTT  --   --   --   --  47*  CREATININE 6.60*   < > 5.70* 5.82* 3.42*   < > = values in this interval not displayed.    Estimated Creatinine Clearance: 24.4 mL/min (A) (by C-G formula based on SCr of 3.42 mg/dL (H)).   Medical History: Past Medical History:  Diagnosis Date  . Arthritis   . Atrial flutter ([place])    Ablation 2018  . 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 Erectile dysfunction   . ESRD (end stage renal disease) on dialysis ([place])    Pt on HD 2003 >> transplanted in 2009, back on HD in 2016. [name] TTS.   . Essential hypertension   . Gout   . Hernia of abdominal wall   . History of blood transfusion   . History of cardiomyopathy   . History of kidney stones   . History of pneumonia   . Migraine   . Myocardial infarction ([place])    1996  . Pneumonia   . Secondary hyperparathyroidism ([place])   . Type 2 diabetes  mellitus ([place])   . Wears glasses     Medications:  Scheduled:  . apixaban  5 mg Oral BID  . aspirin  81 mg Oral Daily  . bisacodyl  10 mg Oral Daily   Or  . bisacodyl  10 mg Rectal Daily  . Chlorhexidine Gluconate Cloth  6 each Topical Daily  . clopidogrel  75 mg Oral Daily  . darbepoetin (ARANESP) injection - NON-DIALYSIS  60 mcg Subcutaneous Q Sat-1800  . docusate sodium  100 mg Oral Daily  . feeding supplement (NEPRO CARB STEADY)  237 mL Oral BID BM  . insulin aspart  0-9 Units Subcutaneous TID AC & HS  . mouth rinse  15 mL Mouth Rinse BID  . metoprolol tartrate  12.5 mg Oral BID   Or  . metoprolol tartrate  12.5 mg Per Tube BID  . midodrine  10 mg Oral TID WC  . multivitamin  1 tablet Oral QHS  . pantoprazole  40 mg Oral Daily  . rosuvastatin  10 mg Oral q1800  . sodium chloride flush  10-40 mL Intracatheter Q12H  . sodium chloride flush  3 mL Intravenous Q12H   Infusions:  .  prismasol  BGK 4/2.5 400 mL/hr at 11/06/19 0113  .  prismasol BGK 4/2.5 200 mL/hr at 11/05/19 1300  . sodium chloride Stopped (10/31/19 1721)  . sodium chloride 500 mL (11/06/19 0805)  . amiodarone 30 mg/hr (11/06/19 0700)  . meropenem (MERREM) IV 1 g (11/06/19 0801)  . norepinephrine (LEVOPHED) Adult infusion 20 mcg/min (11/06/19 0700)  . prismasol BGK 4/2.5 1,800 mL/hr at 11/06/19 0644  . vancomycin     Followed by  . [START ON 11/07/2019] vancomycin      Assessment: 64 yo M with postop afib after CABG. He was not on any anticoagulants PTA.  SCr 3.42 (on CRRT) and weight 102.7 kg  Goal of Therapy:  Monitor platelets by anticoagulation protocol: Yes   Plan:  Apixaban 5 mg BID Will sign off consult due to unlikely need to dose adjust apixaban Pharmacy will monitor peripherally  Vertis Kelch, PharmD, [facility] PGY2 Cardiology Pharmacy Resident Phone [phone] 11/06/2019       9:03 AM  Please check [email or web address] for unit-specific pharmacist phone numbers

## 2019-11-07 ENCOUNTER — Inpatient Hospital Stay (HOSPITAL_COMMUNITY): Payer: Medicare HMO

## 2019-11-07 DIAGNOSIS — I214 Non-ST elevation (NSTEMI) myocardial infarction: Secondary | ICD-10-CM | POA: Diagnosis not present

## 2019-11-07 LAB — CBC
HCT: 26 % — ABNORMAL LOW (ref 39.0–52.0)
Hemoglobin: 8.5 g/dL — ABNORMAL LOW (ref 13.0–17.0)
MCH: 30.8 pg (ref 26.0–34.0)
MCHC: 32.7 g/dL (ref 30.0–36.0)
MCV: 94.2 fL (ref 80.0–100.0)
Platelets: 118 10*3/uL — ABNORMAL LOW (ref 150–400)
RBC: 2.76 MIL/uL — ABNORMAL LOW (ref 4.22–5.81)
RDW: 19.7 % — ABNORMAL HIGH (ref 11.5–15.5)
WBC: 14.7 10*3/uL — ABNORMAL HIGH (ref 4.0–10.5)
nRBC: 0.1 % (ref 0.0–0.2)

## 2019-11-07 LAB — RENAL FUNCTION PANEL
Albumin: 2.2 g/dL — ABNORMAL LOW (ref 3.5–5.0)
Albumin: 2.2 g/dL — ABNORMAL LOW (ref 3.5–5.0)
Anion gap: 18 — ABNORMAL HIGH (ref 5–15)
Anion gap: 12 (ref 5–15)
BUN: 31 mg/dL — ABNORMAL HIGH (ref 8–23)
BUN: 29 mg/dL — ABNORMAL HIGH (ref 8–23)
CO2: 17 mmol/L — ABNORMAL LOW (ref 22–32)
CO2: 22 mmol/L (ref 22–32)
Calcium: 6.8 mg/dL — ABNORMAL LOW (ref 8.9–10.3)
Calcium: 6.8 mg/dL — ABNORMAL LOW (ref 8.9–10.3)
Chloride: 99 mmol/L (ref 98–111)
Chloride: 99 mmol/L (ref 98–111)
Creatinine, Ser: 4.98 mg/dL — ABNORMAL HIGH (ref 0.61–1.24)
Creatinine, Ser: 4.5 mg/dL — ABNORMAL HIGH (ref 0.61–1.24)
GFR calc Af Amer: 13 mL/min — ABNORMAL LOW (ref >60)
GFR calc Af Amer: 15 mL/min — ABNORMAL LOW (ref >60)
GFR calc non Af Amer: 11 mL/min — ABNORMAL LOW (ref >60)
GFR calc non Af Amer: 13 mL/min — ABNORMAL LOW (ref >60)
Glucose, Bld: 132 mg/dL — ABNORMAL HIGH (ref 70–99)
Glucose, Bld: 141 mg/dL — ABNORMAL HIGH (ref 70–99)
Phosphorus: 2.1 mg/dL — ABNORMAL LOW (ref 2.5–4.6)
Phosphorus: 2.4 mg/dL — ABNORMAL LOW (ref 2.5–4.6)
Potassium: 4.8 mmol/L (ref 3.5–5.1)
Potassium: 4.9 mmol/L (ref 3.5–5.1)
Sodium: 134 mmol/L — ABNORMAL LOW (ref 135–145)
Sodium: 133 mmol/L — ABNORMAL LOW (ref 135–145)

## 2019-11-07 LAB — GLUCOSE, CAPILLARY
Glucose-Capillary: 95 mg/dL (ref 70–99)
Glucose-Capillary: 127 mg/dL — ABNORMAL HIGH (ref 70–99)
Glucose-Capillary: 137 mg/dL — ABNORMAL HIGH (ref 70–99)
Glucose-Capillary: 142 mg/dL — ABNORMAL HIGH (ref 70–99)
Glucose-Capillary: 119 mg/dL — ABNORMAL HIGH (ref 70–99)

## 2019-11-07 LAB — APTT: aPTT: 40 seconds — ABNORMAL HIGH (ref 24–36)

## 2019-11-07 LAB — MAGNESIUM: Magnesium: 2.2 mg/dL (ref 1.7–2.4)

## 2019-11-07 MED ORDER — AMIODARONE HCL 200 MG PO TABS
400.0000 mg | ORAL_TABLET | Freq: Every day | ORAL | Status: DC
  Administered 2019-11-07: 400 mg via ORAL

## 2019-11-07 MED ORDER — GERHARDT'S BUTT CREAM
TOPICAL_CREAM | CUTANEOUS | Status: DC | PRN
  Administered 2019-11-14: 2 via TOPICAL
  Administered 2019-12-01 (×3): 1 via TOPICAL
  Filled 2019-11-07 (×3): qty 1

## 2019-11-07 MED ORDER — MIDODRINE HCL 5 MG PO TABS
15.0000 mg | ORAL_TABLET | Freq: Three times a day (TID) | ORAL | Status: DC
  Administered 2019-11-07 – 2019-11-18 (×32): 15 mg via ORAL
  Filled 2019-11-07 (×31): qty 3

## 2019-11-07 MED ORDER — SODIUM CHLORIDE 0.9 % IV SOLN
1.0000 g | Freq: Three times a day (TID) | INTRAVENOUS | Status: DC
  Administered 2019-11-07 – 2019-11-09 (×5): 1 g via INTRAVENOUS
  Filled 2019-11-07 (×9): qty 1

## 2019-11-07 MED ORDER — SODIUM CHLORIDE 0.9 % IV SOLN
1.0000 g | Freq: Two times a day (BID) | INTRAVENOUS | Status: DC
  Filled 2019-11-07: qty 1

## 2019-11-07 MED ORDER — VANCOMYCIN HCL 1250 MG/250ML IV SOLN
1250.0000 mg | INTRAVENOUS | Status: AC
  Administered 2019-11-08 – 2019-11-12 (×5): 1250 mg via INTRAVENOUS
  Filled 2019-11-07 (×5): qty 250

## 2019-11-07 MED ORDER — SODIUM CHLORIDE 0.9 % IV SOLN
1.0000 g | Freq: Three times a day (TID) | INTRAVENOUS | Status: DC
  Filled 2019-11-07 (×2): qty 1

## 2019-11-07 MED ORDER — AMIODARONE HCL 200 MG PO TABS
400.0000 mg | ORAL_TABLET | Freq: Two times a day (BID) | ORAL | Status: DC
  Administered 2019-11-07 (×2): 400 mg via ORAL
  Filled 2019-11-07 (×2): qty 2

## 2019-11-07 MED ORDER — MAGIC MOUTHWASH W/LIDOCAINE
10.0000 mL | Freq: Three times a day (TID) | ORAL | Status: DC | PRN
  Filled 2019-11-07: qty 10

## 2019-11-07 NOTE — Progress Notes (Signed)
Progress Note  Patient Name: Varney Daily Date of Encounter: 11/07/2019  Primary Cardiologist: Dorris Carnes, MD   Subjective   No CP; complains of dyspnea but improved compared to yesterday  Inpatient Medications    Scheduled Meds: . apixaban  5 mg Oral BID  . aspirin  81 mg Oral Daily  . bisacodyl  10 mg Oral Daily   Or  . bisacodyl  10 mg Rectal Daily  . Chlorhexidine Gluconate Cloth  6 each Topical Daily  . clopidogrel  75 mg Oral Daily  . darbepoetin (ARANESP) injection - NON-DIALYSIS  60 mcg Subcutaneous Q Sat-1800  . docusate sodium  100 mg Oral Daily  . feeding supplement (NEPRO CARB STEADY)  237 mL Oral BID BM  . insulin aspart  0-9 Units Subcutaneous TID AC & HS  . mouth rinse  15 mL Mouth Rinse BID  . metoprolol tartrate  12.5 mg Oral BID   Or  . metoprolol tartrate  12.5 mg Per Tube BID  . midodrine  10 mg Oral TID WC  . multivitamin  1 tablet Oral QHS  . pantoprazole  40 mg Oral Daily  . rosuvastatin  10 mg Oral q1800  . sodium chloride flush  10-40 mL Intracatheter Q12H  . sodium chloride flush  3 mL Intravenous Q12H   Continuous Infusions: .  prismasol BGK 4/2.5 400 mL/hr at 11/06/19 0113  .  prismasol BGK 4/2.5 200 mL/hr at 11/05/19 1300  . sodium chloride Stopped (10/31/19 1721)  . sodium chloride Stopped (11/06/19 2251)  . heparin 10,000 units/ 20 mL infusion syringe 500 Units/hr (11/06/19 1700)  . meropenem (MERREM) IV Stopped (11/07/19 7673)  . norepinephrine (LEVOPHED) Adult infusion 38 mcg/min (11/07/19 0700)  . prismasol BGK 4/2.5 1,800 mL/hr at 11/06/19 1547  . vancomycin 166.7 mL/hr at 11/07/19 0700   PRN Meds: sodium chloride, sodium chloride, acetaminophen, alteplase, dextrose, fentaNYL (SUBLIMAZE) injection, heparin, levalbuterol, metoprolol tartrate, ondansetron (ZOFRAN) IV, oxyCODONE, sodium chloride, sodium chloride flush, sodium chloride flush, traMADol   Vital Signs    Vitals:   11/07/19 0545 11/07/19 0600 11/07/19 0615 11/07/19  0630  BP: 97/64 95/67 93/72  110/76  Pulse: (!) 105 (!) 105 (!) 105 (!) 105  Resp: (!) 29 (!) 29 (!) 30 (!) 27  Temp:      TempSrc:      SpO2: 99% 98% 97% 98%  Weight:      Height:        Intake/Output Summary (Last 24 hours) at 11/07/2019 0730 Last data filed at 11/07/2019 0700 Gross per 24 hour  Intake 1897.37 ml  Output 1479 ml  Net 418.37 ml   Last 3 Weights 11/07/2019 11/06/2019 11/05/2019  Weight (lbs) 233 lb 4 oz 226 lb 6.6 oz 231 lb 4.2 oz  Weight (kg) 105.8 kg 102.7 kg 104.9 kg      Telemetry    AV paced with underlying sinus with bursts of PAT- Personally Reviewed   Physical Exam   GEN: No acute distress.   Neck: supple Cardiac: irregular Respiratory: mildly diminished BS bases GI: Soft, nontender, non-distended  MS: No edema Neuro:  Nonfocal  Psych: Normal affect   Labs    High Sensitivity Troponin:   Recent Labs  Lab 10/24/19 0737  TROPONINIHS 2,995*      Chemistry Recent Labs  Lab 11/01/19 0751 11/01/19 0803 11/06/19 0318 11/06/19 1638 11/07/19 0110  NA 138   < > 136 134* 134*  K 4.2   < > 4.4 4.8  4.8  CL 104   < > 98 99 99  CO2 25   < > 25 20* 17*  GLUCOSE 139*   < > 125* 139* 132*  BUN 23   < > 21 24* 31*  CREATININE 2.35*   < > 3.42* 4.00* 4.98*  CALCIUM 7.4*   < > 7.6* 7.1* 6.8*  PROT 5.2*  --   --   --   --   ALBUMIN 2.3*   < > 2.4* 2.1* 2.2*  AST 102*  --   --   --   --   ALT 54*  --   --   --   --   ALKPHOS 67  --   --   --   --   BILITOT 0.9  --   --   --   --   GFRNONAA 28*   < > 18* 15* 11*  GFRAA 32*   < > 21* 17* 13*  ANIONGAP 9   < > 13 15 18*   < > = values in this interval not displayed.     Hematology Recent Labs  Lab 11/05/19 0343 11/06/19 0318 11/07/19 0110  WBC 10.8* 15.5* 14.7*  RBC 2.99* 3.32* 2.76*  HGB 9.1* 10.1* 8.5*  HCT 28.8* 32.3* 26.0*  MCV 96.3 97.3 94.2  MCH 30.4 30.4 30.8  MCHC 31.6 31.3 32.7  RDW 19.9* 20.2* 19.7*  PLT 85* 102* 118*    Radiology    DG Chest 2 View  Result Date:  11/07/2019 CLINICAL DATA:  64 year old male status post open heart surgery. Shortness of breath. Chest pain. EXAM: CHEST - 2 VIEW COMPARISON:  Chest x-ray 11/06/2019. FINDINGS: Left internal jugular PermCath with tip terminating at the superior cavoatrial junction. There is a right-sided internal jugular central venous catheter with tip terminating in the distal superior vena cava. Lung volumes are low. Bibasilar opacities (left greater than right), favored to reflect resolving areas of postoperative subsegmental atelectasis. Small bilateral pleural effusions (left greater than right). No pneumothorax. No evidence of pulmonary edema. Cardiopericardial silhouette appears enlarged, but similar to prior postoperative examinations. Aortic atherosclerosis. Status post median sternotomy for CABG. IMPRESSION: 1. Postoperative changes and support apparatus, as above. 2. Low lung volumes with bibasilar areas of postoperative atelectasis and small bilateral pleural effusions (left greater than right). 3. Aortic atherosclerosis. Electronically Signed   By: Vinnie Langton M.D.   On: 11/07/2019 06:12   DG Chest Port 1 View  Result Date: 11/06/2019 CLINICAL DATA:  Open heart surgery. EXAM: PORTABLE CHEST 1 VIEW COMPARISON:  11/04/2019 FINDINGS: Left IJ venous catheter with tip over the right atrium unchanged. Right IJ central venous catheter with tip over the SVC unchanged. Lungs are adequately inflated with mild worsening opacification over the left mid to lower lung likely worsening effusion with possible basilar atelectasis. Mild stable density over the medial right base. Stable cardiomegaly. Remainder the exam is unchanged. IMPRESSION: 1. Worsening hazy opacification over the left base likely worsening effusion/atelectasis. Stable mild opacification over the medial right base. 2.  Stable moderate cardiomegaly. 3.  Tubes and lines unchanged. Electronically Signed   By: Marin Olp M.D.   On: 11/06/2019 08:16     Patient Profile     64 year old gentleman with a history of coronary artery disease, end-stage renal disease on dialysis underwent coronary artery bypass and graft on April 1 with saphenous vein graft to the LAD, saphenous vein graft to the PDA obtuse marginal; aortic valve replacement with bioprosthetic aortic  valve.  Postoperative echocardiogram shows ejection fraction 35 to 16%, grade 2 diastolic dysfunction, mild left atrial enlargement, aortic valve replacement with normal function.  Assessment & Plan    1 postoperative atrial fibrillation/PAT-patient continues to have bursts of PAT.  Will resume amiodarone 400 mg by mouth twice daily for 1 week then decrease to 200 mg daily thereafter.  Continue apixaban.  He is having some episodes of bradycardia and will discontinue metoprolol for now.  Follow rhythm closely.  2 ischemic cardiomyopathy-LV function reduced.  However blood pressure will not allow hydralazine/nitrates or beta-blocker at this point.  Will add later if blood pressure allows.  Continue midodrine for now.  3 postoperative volume excess-continue dialysis per nephrology.  4 coronary artery disease status post non-ST elevation myocardial infarction-continue aspirin, Plavix and statin.  Will likely discontinue aspirin in 2 weeks given need for apixaban.  5 end-stage renal disease-dialysis per nephrology.  6 fever-antibiotics initiated.  7 s/p AVR-postoperative echocardiogram shows normally functioning valve.  Wean norepinephrine as tolerated.  For questions or updates, please contact [place] Please consult [email or web address] for contact info under        Signed, Kirk Ruths, MD  11/07/2019, 7:30 AM

## 2019-11-07 NOTE — Progress Notes (Signed)
11 Days Post-Op Procedure(s) (LRB): REDO CORONARY ARTERY BYPASS GRAFTING TIMES 3  USING LEFT GREATER SAPHENOUS LEG VEIN HARVESTED ENDOSCOPICALLY(CABG) (N/A) TRANSESOPHAGEAL ECHOCARDIOGRAM (TEE) (N/A) AORTIC VALVE REPLACEMENT (AVR) USING EDWARDS INTUITY 23 MM AORTIC VALVE. (N/A) Endovein Harvest Of Greater Saphenous Vein (Left) Subjective: Feeling better than last night  Objective: Vital signs in last 24 hours: Temp:  [98.2 F (36.8 C)-103 F (39.4 C)] 98.7 F (37.1 C) (04/12 0740) Pulse Rate:  [65-116] 105 (04/12 0800) Cardiac Rhythm: A-V Sequential paced (04/12 0800) Resp:  [15-42] 23 (04/12 0800) BP: (38-119)/(11-88) 109/60 (04/12 0800) SpO2:  [86 %-100 %] 100 % (04/12 0800) Weight:  [105.8 kg] 105.8 kg (04/12 0500)  Hemodynamic parameters for last 24 hours:    Intake/Output from previous day: 04/11 0701 - 04/12 0700 In: 1897.4 [P.O.:240; I.V.:952; IV Piggyback:705.3] Out: 1479  Intake/Output this shift: Total I/O In: 202.2 [I.V.:35.5; IV Piggyback:166.7] Out: -   General appearance: alert and cooperative Neurologic: intact Heart: irregularly irregular rhythm Lungs: clear to auscultation bilaterally Abdomen: soft, non-tender; bowel sounds normal; no masses,  no organomegaly Extremities: extremities normal, atraumatic, no cyanosis or edema Wound: c/d/i  Lab Results: Recent Labs    11/06/19 0318 11/07/19 0110  WBC 15.5* 14.7*  HGB 10.1* 8.5*  HCT 32.3* 26.0*  PLT 102* 118*   BMET:  Recent Labs    11/06/19 1638 11/07/19 0110  NA 134* 134*  K 4.8 4.8  CL 99 99  CO2 20* 17*  GLUCOSE 139* 132*  BUN 24* 31*  CREATININE 4.00* 4.98*  CALCIUM 7.1* 6.8*    PT/INR: No results for input(s): LABPROT, INR in the last 72 hours. ABG    Component Value Date/Time   PHART 7.416 11/01/2019 0803   HCO3 29.2 (H) 11/01/2019 0803   TCO2 31 11/01/2019 0803   ACIDBASEDEF 4.0 (H) 10/28/2019 1825   O2SAT 99.0 11/01/2019 0803   CBG (last 3)  Recent Labs     11/06/19 1210 11/06/19 2128 11/07/19 0642  GLUCAP 92 108* 127*    Assessment/Plan: S/P Procedure(s) (LRB): REDO CORONARY ARTERY BYPASS GRAFTING TIMES 3  USING LEFT GREATER SAPHENOUS LEG VEIN HARVESTED ENDOSCOPICALLY(CABG) (N/A) TRANSESOPHAGEAL ECHOCARDIOGRAM (TEE) (N/A) AORTIC VALVE REPLACEMENT (AVR) USING EDWARDS INTUITY 23 MM AORTIC VALVE. (N/A) Endovein Harvest Of Greater Saphenous Vein (Left) f/u cultures  Continue A-V pacing Restart amiodarone po Remains in a very tenuous medical condition.   LOS: 14 days    Wonda Olds 11/07/2019

## 2019-11-07 NOTE — Progress Notes (Signed)
Patient taken down to radiology for 2-view xray via bed.  Pt became very short of breath with minimal exertion during xray.  Patient weighed in bed at this time due to the issues with work of breathing.

## 2019-11-07 NOTE — Progress Notes (Signed)
Patient ID: GAVINN COLLARD, male   DOB: 07/18/1956, 64 y.o.   MRN: [id number] S: "I feel rough".  CRRT stopped yesterday at 5 pm due to tachycardia and hypotension.  Plan to resume this morning O:BP 109/60   Pulse (!) 105   Temp 98.7 F (37.1 C) (Oral)   Resp (!) 23   Ht 5' 6.5" (1.689 m)   Wt 105.8 kg   SpO2 100%   BMI 37.08 kg/m   Intake/Output Summary (Last 24 hours) at 11/07/2019 0849 Last data filed at 11/07/2019 0800 Gross per 24 hour  Intake 2064.12 ml  Output 1299 ml  Net 765.12 ml   Intake/Output: I/O last 3 completed shifts: In: 2371.2 [P.O.:240; I.V.:1425.9; IV Piggyback:705.3] Out: 3149 [Other:3593]  Intake/Output this shift:  Total I/O In: 202.2 [I.V.:35.5; IV Piggyback:166.7] Out: -  Weight change: 3.1 kg Gen: +anasarca, fatigued.  NAd CVS: tachy at 105 Resp: decreased BS at bases Abd: +BS, soft Ext: 2+ anasarca  Recent Labs  Lab 11/01/19 0751 11/01/19 0803 11/02/19 0445 11/02/19 0445 11/03/19 0236 11/04/19 0312 11/05/19 0343 11/05/19 1613 11/06/19 0318 11/06/19 1638 11/07/19 0110  NA 138   < > 138   < > 136 135 135 134* 136 134* 134*  K 4.2   < > 4.5   < > 4.5 4.6 4.1 4.3 4.4 4.8 4.8  CL 104   < > 103   < > 103 100 97* 97* 98 99 99  CO2 25   < > 23   < > 20* 22 25 24 25  20* 17*  GLUCOSE 139*   < > 139*   < > 137* 158* 156* 147* 125* 139* 132*  BUN 23   < > 22   < > 42* 62* 37* 39* 21 24* 31*  CREATININE 2.35*   < > 2.40*   < > 4.37* 6.60* 5.70* 5.82* 3.42* 4.00* 4.98*  ALBUMIN 2.3*   < > 2.7*  --   --  2.4* 2.3* 2.3* 2.4* 2.1* 2.2*  CALCIUM 7.4*   < > 7.7*   < > 7.2* 6.8* 7.0* 6.8* 7.6* 7.1* 6.8*  PHOS  --    < > 2.1*  --   --  2.2* 1.8* 2.2* 1.8* 2.5 2.1*  AST 102*  --   --   --   --   --   --   --   --   --   --   ALT 54*  --   --   --   --   --   --   --   --   --   --    < > = values in this interval not displayed.   Liver Function Tests: Recent Labs  Lab 11/01/19 0751 11/01/19 1612 11/06/19 0318 11/06/19 1638 11/07/19 0110  AST  102*  --   --   --   --   ALT 54*  --   --   --   --   ALKPHOS 67  --   --   --   --   BILITOT 0.9  --   --   --   --   PROT 5.2*  --   --   --   --   ALBUMIN 2.3*   < > 2.4* 2.1* 2.2*   < > = values in this interval not displayed.   No results for input(s): LIPASE, AMYLASE in the last 168 hours. Recent Labs  Lab 11/01/19 [phone]  AMMONIA 14   CBC: Recent Labs  Lab 11/03/19 0236 11/03/19 0236 11/04/19 0312 11/04/19 0312 11/05/19 0343 11/06/19 0318 11/07/19 0110  WBC 11.9*   < > 10.9*   < > 10.8* 15.5* 14.7*  HGB 10.0*   < > 9.6*   < > 9.1* 10.1* 8.5*  HCT 31.9*   < > 31.3*   < > 28.8* 32.3* 26.0*  MCV 97.3  --  99.1  --  96.3 97.3 94.2  PLT 56*   < > 64*   < > 85* 102* 118*   < > = values in this interval not displayed.   Cardiac Enzymes: No results for input(s): CKTOTAL, CKMB, CKMBINDEX, TROPONINI in the last 168 hours. CBG: Recent Labs  Lab 11/05/19 2109 11/06/19 0641 11/06/19 1210 11/06/19 2128 11/07/19 0642  GLUCAP 121* 88 92 108* 127*    Iron Studies: No results for input(s): IRON, TIBC, TRANSFERRIN, FERRITIN in the last 72 hours. Studies/Results: DG Chest 2 View  Result Date: 11/07/2019 CLINICAL DATA:  64 year old male status post open heart surgery. Shortness of breath. Chest pain. EXAM: CHEST - 2 VIEW COMPARISON:  Chest x-ray 11/06/2019. FINDINGS: Left internal jugular PermCath with tip terminating at the superior cavoatrial junction. There is a right-sided internal jugular central venous catheter with tip terminating in the distal superior vena cava. Lung volumes are low. Bibasilar opacities (left greater than right), favored to reflect resolving areas of postoperative subsegmental atelectasis. Small bilateral pleural effusions (left greater than right). No pneumothorax. No evidence of pulmonary edema. Cardiopericardial silhouette appears enlarged, but similar to prior postoperative examinations. Aortic atherosclerosis. Status post median sternotomy for CABG.  IMPRESSION: 1. Postoperative changes and support apparatus, as above. 2. Low lung volumes with bibasilar areas of postoperative atelectasis and small bilateral pleural effusions (left greater than right). 3. Aortic atherosclerosis. Electronically Signed   By: Vinnie Langton M.D.   On: 11/07/2019 06:12   DG Chest Port 1 View  Result Date: 11/06/2019 CLINICAL DATA:  Open heart surgery. EXAM: PORTABLE CHEST 1 VIEW COMPARISON:  11/04/2019 FINDINGS: Left IJ venous catheter with tip over the right atrium unchanged. Right IJ central venous catheter with tip over the SVC unchanged. Lungs are adequately inflated with mild worsening opacification over the left mid to lower lung likely worsening effusion with possible basilar atelectasis. Mild stable density over the medial right base. Stable cardiomegaly. Remainder the exam is unchanged. IMPRESSION: 1. Worsening hazy opacification over the left base likely worsening effusion/atelectasis. Stable mild opacification over the medial right base. 2.  Stable moderate cardiomegaly. 3.  Tubes and lines unchanged. Electronically Signed   By: Marin Olp M.D.   On: 11/06/2019 08:16   . amiodarone  400 mg Oral BID  . apixaban  5 mg Oral BID  . aspirin  81 mg Oral Daily  . bisacodyl  10 mg Oral Daily   Or  . bisacodyl  10 mg Rectal Daily  . Chlorhexidine Gluconate Cloth  6 each Topical Daily  . clopidogrel  75 mg Oral Daily  . darbepoetin (ARANESP) injection - NON-DIALYSIS  60 mcg Subcutaneous Q Sat-1800  . docusate sodium  100 mg Oral Daily  . feeding supplement (NEPRO CARB STEADY)  237 mL Oral BID BM  . insulin aspart  0-9 Units Subcutaneous TID AC & HS  . mouth rinse  15 mL Mouth Rinse BID  . midodrine  10 mg Oral TID WC  . multivitamin  1 tablet Oral QHS  . pantoprazole  40 mg Oral  Daily  . rosuvastatin  10 mg Oral q1800  . sodium chloride flush  10-40 mL Intracatheter Q12H  . sodium chloride flush  3 mL Intravenous Q12H    BMET    Component Value  Date/Time   NA 134 (L) 11/07/2019 0110   NA 139 11/30/2017 1008   K 4.8 11/07/2019 0110   CL 99 11/07/2019 0110   CO2 17 (L) 11/07/2019 0110   GLUCOSE 132 (H) 11/07/2019 0110   BUN 31 (H) 11/07/2019 0110   BUN 48 (H) 11/30/2017 1008   CREATININE 4.98 (H) 11/07/2019 0110   CALCIUM 6.8 (L) 11/07/2019 0110   GFRNONAA 11 (L) 11/07/2019 0110   GFRAA 13 (L) 11/07/2019 0110   CBC    Component Value Date/Time   WBC 14.7 (H) 11/07/2019 0110   RBC 2.76 (L) 11/07/2019 0110   HGB 8.5 (L) 11/07/2019 0110   HGB 11.9 (L) 11/30/2017 1008   HCT 26.0 (L) 11/07/2019 0110   HCT 35.3 (L) 11/30/2017 1008   PLT 118 (L) 11/07/2019 0110   PLT 197 11/30/2017 1008   MCV 94.2 11/07/2019 0110   MCV 92 11/30/2017 1008   MCH 30.8 11/07/2019 0110   MCHC 32.7 11/07/2019 0110   RDW 19.7 (H) 11/07/2019 0110   RDW 16.8 (H) 11/30/2017 1008   LYMPHSABS 1.0 10/24/2019 0737   MONOABS 0.7 10/24/2019 0737   EOSABS 0.2 10/24/2019 0737   BASOSABS 0.1 10/24/2019 0737     [facility]: [facility]   4h  99.5  2K/3.5Ca  P4  TDC   Hep 3000  - mircera 75 last 3/22  - hect 1   Assessment/ Plan: 1. NSTEMI/CAD s/p redo CABG 0/1/09 complicated by hypotension and volume overload.   2. Cardiogenic shock - recurrent , back on pressors 3. Resp failure - suspected pulm edema d/t vol excess. Better.  4. ESRD - usual HD MWF, sp CRRT 4/1 due to ongoing issues with hypotension and volume overload.  Was doing well on CVVHD until yesterday evening with a fib/RVR and VTach with hypotension.  CVVHD was stopped last night and will resume this morning.  Will keep even to start and will need to UF slowly given his tachycardia and hypotension.  5. Anemia abl/ ckd - tsat 16%, ferr 1293.  Due for esa on 4/5,started darbe 60 qwk SQ 1st dose 4/10.  If continues to fall will need blood transfusion. 6. S/p AVR- good valve function per ECHO. 7. MBD ckd - hect 1ug tiw, holding phoslo d/t low phos ~2  Donetta Potts, MD [facility] [phone]

## 2019-11-07 NOTE — Progress Notes (Signed)
Patient ID: Patrick [name], male   DOB: 1955/08/08, 64 y.o.   MRN: [id number] TCTS Evening Rounds:  Remains on NE for BP support but decreased from 38-35 mcg. On midodrine and dose increased today. Last BP 121/74.  CRRT removing 50/hr.  sats 96%.

## 2019-11-07 NOTE — Progress Notes (Signed)
Physical Therapy Treatment Patient Details Name: Patrick [name] MRN: [id number] DOB: 1955/08/31 Today's Date: 11/07/2019    History of Present Illness Pt is a 64 yo male s/p re-do CABGx3 with Aortic valve replacement. Pt has been on CRRT but removed 4/11 due to hypotension and VTach with pending return to CRRT 4/12. PMHx: ESRD MWF, HTN, PNA, T2DM, MI.    PT Comments    Pt currently off CRRT on arrival but deferred OOB to chair per RN request as pt pending return to CRRT this am. Pt educated for sternal precautions, transfers and progression. Pt able to side step and perform repeated standing but fatigues quickly. Pt educated for HEP and progression with plan still appropriate. Wife present and she and pt aware of bil UE and bil LE HEP to perform at bed level throughout day for progression.   Pt with sats >92% on RA throughout session with return to 1L end of session with sats 97% Supine 109/60 (76) Sitting 104/77 (87) Return to supine 96/58 (70) Hr 105-128 with activity   Follow Up Recommendations  CIR;Supervision/Assistance - 24 hour     Equipment Recommendations  Rolling walker with 5" wheels    Recommendations for Other Services       Precautions / Restrictions Precautions Precautions: Fall;Sternal Precaution Comments: external pacer, flexiseal, to return to CRRT post session    Mobility  Bed Mobility Overal bed mobility: Needs Assistance Bed Mobility: Rolling;Sidelying to Sit Rolling: Min assist Sidelying to sit: Mod assist;HOB elevated       General bed mobility comments: min assist to roll with HOB 30 degrees toward left with cues for sequence, pt able to move legs toward EOB with assist to elevate trunk and cues for sequence/precautions  Transfers Overall transfer level: Needs assistance   Transfers: Sit to/from Stand Sit to Stand: Mod assist;+2 physical assistance         General transfer comment: cues for hand placement and sequence, assist to rise. pt  stood x 3 trials with 2 trials of side stepping toward [facility] with physical assist to support trunk and min +2 to side step  Ambulation/Gait                 Stairs             Wheelchair Mobility    Modified Rankin (Stroke Patients Only)       Balance Overall balance assessment: Needs assistance   Sitting balance-Leahy Scale: Good     Standing balance support: Bilateral upper extremity supported Standing balance-Leahy Scale: Poor Standing balance comment: bil HHA                            Cognition Arousal/Alertness: Awake/alert Behavior During Therapy: WFL for tasks assessed/performed Overall Cognitive Status: Impaired/Different from baseline Area of Impairment: Memory                     Memory: Decreased recall of precautions         General Comments: pt does not recall precautions and educated throughout session with cues to maintain during mobility, Tendency to reach out with arms to prop and move      Exercises General Exercises - Lower Extremity Long Arc Quad: AROM;10 reps;Seated;Both Hip Flexion/Marching: AROM;Both;Seated;10 reps    General Comments        Pertinent Vitals/Pain Pain Assessment: No/denies pain    Home Living  Prior Function            PT Goals (current goals can now be found in the care plan section) Progress towards PT goals: Progressing toward goals    Frequency    Min 3X/week      PT Plan Current plan remains appropriate    Co-evaluation              AM-PAC PT "6 Clicks" Mobility   Outcome Measure  Help needed turning from your back to your side while in a flat bed without using bedrails?: A Little Help needed moving from lying on your back to sitting on the side of a flat bed without using bedrails?: A Lot Help needed moving to and from a bed to a chair (including a wheelchair)?: A Lot Help needed standing up from a chair using your arms (e.g.,  wheelchair or bedside chair)?: A Lot Help needed to walk in hospital room?: A Lot Help needed climbing 3-5 steps with a railing? : Total 6 Click Score: 12    End of Session   Activity Tolerance: Patient tolerated treatment well Patient left: in bed;with call bell/phone within reach;with family/visitor present Nurse Communication: Mobility status PT Visit Diagnosis: Other abnormalities of gait and mobility (R26.89);Muscle weakness (generalized) (M62.81)     Time: 2505-3976 PT Time Calculation (min) (ACUTE ONLY): 24 min  Charges:  $Therapeutic Exercise: 8-22 mins $Therapeutic Activity: 8-22 mins                     Takyia Sindt P, PT Acute Rehabilitation Services Pager: [phone] Office: [place] [facility] [place] 11/07/2019, 1:08 PM

## 2019-11-08 ENCOUNTER — Inpatient Hospital Stay (HOSPITAL_COMMUNITY): Payer: Medicare HMO

## 2019-11-08 DIAGNOSIS — I214 Non-ST elevation (NSTEMI) myocardial infarction: Secondary | ICD-10-CM | POA: Diagnosis not present

## 2019-11-08 DIAGNOSIS — I480 Paroxysmal atrial fibrillation: Secondary | ICD-10-CM | POA: Diagnosis not present

## 2019-11-08 LAB — RENAL FUNCTION PANEL
Albumin: <1 g/dL — ABNORMAL LOW (ref 3.5–5.0)
Albumin: 2.2 g/dL — ABNORMAL LOW (ref 3.5–5.0)
Anion gap: 10 (ref 5–15)
Anion gap: 9 (ref 5–15)
BUN: 25 mg/dL — ABNORMAL HIGH (ref 8–23)
BUN: 16 mg/dL (ref 8–23)
CO2: 21 mmol/L — ABNORMAL LOW (ref 22–32)
CO2: 22 mmol/L (ref 22–32)
Calcium: 5.9 mg/dL — CL (ref 8.9–10.3)
Calcium: 6.8 mg/dL — ABNORMAL LOW (ref 8.9–10.3)
Chloride: 104 mmol/L (ref 98–111)
Chloride: 96 mmol/L — ABNORMAL LOW (ref 98–111)
Creatinine, Ser: 4.26 mg/dL — ABNORMAL HIGH (ref 0.61–1.24)
Creatinine, Ser: 2.79 mg/dL — ABNORMAL HIGH (ref 0.61–1.24)
GFR calc Af Amer: 16 mL/min — ABNORMAL LOW (ref >60)
GFR calc Af Amer: 26 mL/min — ABNORMAL LOW (ref >60)
GFR calc non Af Amer: 14 mL/min — ABNORMAL LOW (ref >60)
GFR calc non Af Amer: 23 mL/min — ABNORMAL LOW (ref >60)
Glucose, Bld: 131 mg/dL — ABNORMAL HIGH (ref 70–99)
Glucose, Bld: 170 mg/dL — ABNORMAL HIGH (ref 70–99)
Phosphorus: 2 mg/dL — ABNORMAL LOW (ref 2.5–4.6)
Phosphorus: 2.7 mg/dL (ref 2.5–4.6)
Potassium: 4.7 mmol/L (ref 3.5–5.1)
Potassium: 4.1 mmol/L (ref 3.5–5.1)
Sodium: 135 mmol/L (ref 135–145)
Sodium: 127 mmol/L — ABNORMAL LOW (ref 135–145)

## 2019-11-08 LAB — GLUCOSE, CAPILLARY
Glucose-Capillary: 106 mg/dL — ABNORMAL HIGH (ref 70–99)
Glucose-Capillary: 128 mg/dL — ABNORMAL HIGH (ref 70–99)
Glucose-Capillary: 145 mg/dL — ABNORMAL HIGH (ref 70–99)
Glucose-Capillary: 146 mg/dL — ABNORMAL HIGH (ref 70–99)

## 2019-11-08 LAB — CULTURE, BLOOD (ROUTINE X 2)
Culture: NO GROWTH
Culture: NO GROWTH
Special Requests: ADEQUATE

## 2019-11-08 LAB — MAGNESIUM: Magnesium: 2.1 mg/dL (ref 1.7–2.4)

## 2019-11-08 LAB — CBC
HCT: 27.1 % — ABNORMAL LOW (ref 39.0–52.0)
Hemoglobin: 8.5 g/dL — ABNORMAL LOW (ref 13.0–17.0)
MCH: 30.8 pg (ref 26.0–34.0)
MCHC: 31.4 g/dL (ref 30.0–36.0)
MCV: 98.2 fL (ref 80.0–100.0)
Platelets: 142 10*3/uL — ABNORMAL LOW (ref 150–400)
RBC: 2.76 MIL/uL — ABNORMAL LOW (ref 4.22–5.81)
RDW: 20.1 % — ABNORMAL HIGH (ref 11.5–15.5)
WBC: 14.5 10*3/uL — ABNORMAL HIGH (ref 4.0–10.5)
nRBC: 0.1 % (ref 0.0–0.2)

## 2019-11-08 LAB — APTT: aPTT: 78 seconds — ABNORMAL HIGH (ref 24–36)

## 2019-11-08 MED ORDER — SODIUM PHOSPHATES 45 MMOLE/15ML IV SOLN
20.0000 mmol | Freq: Once | INTRAVENOUS | Status: AC
  Administered 2019-11-08: 20 mmol via INTRAVENOUS
  Filled 2019-11-08: qty 6.67

## 2019-11-08 MED ORDER — AMIODARONE LOAD VIA INFUSION
150.0000 mg | Freq: Once | INTRAVENOUS | Status: AC
  Administered 2019-11-08: 150 mg via INTRAVENOUS
  Filled 2019-11-08: qty 83.34

## 2019-11-08 MED ORDER — ENSURE ENLIVE PO LIQD
237.0000 mL | Freq: Three times a day (TID) | ORAL | Status: DC
  Administered 2019-11-09 – 2019-11-17 (×12): 237 mL via ORAL

## 2019-11-08 MED ORDER — ALBUMIN HUMAN 25 % IV SOLN
12.5000 g | Freq: Once | INTRAVENOUS | Status: AC
  Administered 2019-11-08: 12:00:00 12.5 g via INTRAVENOUS
  Filled 2019-11-08: qty 50

## 2019-11-08 MED ORDER — AMIODARONE HCL IN DEXTROSE 360-4.14 MG/200ML-% IV SOLN
30.0000 mg/h | INTRAVENOUS | Status: DC
  Administered 2019-11-08 – 2019-11-10 (×6): 30 mg/h via INTRAVENOUS
  Administered 2019-11-11 (×2): 60 mg/h via INTRAVENOUS
  Administered 2019-11-12: 15:00:00 30 mg/h via INTRAVENOUS
  Filled 2019-11-08 (×14): qty 200

## 2019-11-08 NOTE — Progress Notes (Signed)
Spoke with RN Lavella Lemons after PIV was placed, the patient's right arm is edematous and the patient has very limited amount of access due to the edema. If this PIV fails or infiltrates, the recommendation would be to exchange the patient's central line for a double of triple lumen.

## 2019-11-08 NOTE — Progress Notes (Signed)
Occupational Therapy Treatment Patient Details Name: Patrick [name] MRN: [id number] DOB: 1955/11/21 Today's Date: 11/08/2019    History of present illness Pt is a 64 yo male s/p re-do CABGx3 with Aortic valve replacement. Pt has been on CRRT but removed 4/11 due to hypotension and VTach with pending return to CRRT 4/12. PMHx: ESRD MWF, HTN, PNA, T2DM, MI.   OT comments  Pt progressing to sitting EOB, standing x2 times with modA +2 for stability, but pt feeling too fatigued to transfer to recliner. Pt sitting at EOB for trunk exercises and pt able to verbalize sternal precautions. Pt continues to require near [place] for LB ADL, but pt improving with ability to perform UB ADL. Pt fatigues very easily at EOB, wanting to lean on L elbow with HOB at 25* RN in room as CRRT running for linesO2 at rest 2L >90%; at EOB 3LO2 >94%; BP soft 95/65 at EOB. CRRT running. Pt motivated to return to PLOF and supportive spouse in room. Pt continues to require continued OT for CIR setting. OT following acutely.   Follow Up Recommendations  CIR    Equipment Recommendations  Other (comment)(deferred to next venue)    Recommendations for Other Services      Precautions / Restrictions Precautions Precautions: Fall;Sternal Precaution Booklet Issued: No Precaution Comments: external pacer, flexiseal, CRRT  Restrictions Weight Bearing Restrictions: Yes Other Position/Activity Restrictions: sternal precautions       Mobility Bed Mobility Overal bed mobility: Needs Assistance Bed Mobility: Rolling;Sidelying to Sit;Sit to Supine Rolling: Min assist Sidelying to sit: Mod assist;HOB elevated   Sit to supine: Mod assist   General bed mobility comments: totalA for scooting to [facility]  Transfers Overall transfer level: Needs assistance Equipment used: 2 person hand held assist Transfers: Sit to/from Stand Sit to Stand: Mod assist;+2 physical assistance         General transfer comment: pt too fatigued  today to sit in recliner; pt sitting EOB x15 mins with minguardA to keep from leaning to far to L for energy conservation/fatigue.    Balance Overall balance assessment: Needs assistance   Sitting balance-Leahy Scale: Good     Standing balance support: Bilateral upper extremity supported Standing balance-Leahy Scale: Poor Standing balance comment: bil HHA                           ADL either performed or assessed with clinical judgement   ADL Overall ADL's : Needs assistance/impaired     Grooming: Wash/dry face;Set up;Sitting                       Toileting- Clothing Manipulation and Hygiene: Total assistance Toileting - Clothing Manipulation Details (indicate cue type and reason): flex seal and catheter in     Functional mobility during ADLs: Moderate assistance;+2 for physical assistance;+2 for safety/equipment;Cueing for safety;Cueing for sequencing General ADL Comments: Pt continues to require near [place] for LB ADL, but pt improving with ability to perform UB ADL. Pt fatigues very easily at EOB, wanting to lean on L elbow with HOB at 25*      Vision Baseline Vision/History: No visual deficits Vision Assessment?: Vision impaired- to be further tested in functional context   Perception     Praxis      Cognition Arousal/Alertness: Awake/alert Behavior During Therapy: WFL for tasks assessed/performed Overall Cognitive Status: Impaired/Different from baseline Area of Impairment: Memory  Memory: Decreased recall of precautions         General Comments: Pt able to recall no push/pull precautions.        Exercises Exercises: General Upper Extremity General Exercises - Upper Extremity Shoulder Flexion: AAROM;Both;5 reps;Seated Elbow Flexion: AAROM;5 reps;Seated;Both Digit Composite Flexion: AROM;5 reps;Both;Squeeze ball   Shoulder Instructions       General Comments O2 at rest 2L >90%; at EOB 3LO2 >94%; BP soft  95/65 at EOB. CRRT running.    Pertinent Vitals/ Pain       Pain Assessment: Faces Faces Pain Scale: Hurts a little bit Pain Intervention(s): Monitored during session  Home Living Family/patient expects to be discharged to:: Private residence Living Arrangements: Spouse/significant other Available Help at Discharge: Family;Available 24 hours/day Type of Home: Apartment Home Access: Stairs to enter Entrance Stairs-Number of Steps: 12 Entrance Stairs-Rails: Right;Left Home Layout: One level     Bathroom Shower/Tub: Teacher, early years/pre: Standard     Home Equipment: Cane - single point          Prior Functioning/Environment Level of Independence: Independent            Frequency  Min 2X/week        Progress Toward Goals  OT Goals(current goals can now be found in the care plan section)     Acute Rehab OT Goals Patient Stated Goal: to go home OT Goal Formulation: With patient/family Time For Goal Achievement: 11/15/19 Potential to Achieve Goals: Good  Plan      Co-evaluation                 AM-PAC OT "6 Clicks" Daily Activity     Outcome Measure   Help from another person eating meals?: Total Help from another person taking care of personal grooming?: A Lot Help from another person toileting, which includes using toliet, bedpan, or urinal?: Total Help from another person bathing (including washing, rinsing, drying)?: A Lot Help from another person to put on and taking off regular upper body clothing?: A Little Help from another person to put on and taking off regular lower body clothing?: Total 6 Click Score: 10    End of Session Equipment Utilized During Treatment: Gait belt;Oxygen  OT Visit Diagnosis: Unsteadiness on feet (R26.81);Muscle weakness (generalized) (M62.81);Pain;Other symptoms and signs involving cognitive function Pain - part of body: (chest)   Activity Tolerance Patient tolerated treatment well;Patient limited by  fatigue   Patient Left in bed;with call bell/phone within reach;with bed alarm set;with nursing/sitter in room;Other (comment)(chair position)   Nurse Communication Mobility status        Time: 4944-9675 OT Time Calculation (min): 28 min  Charges: OT General Charges $OT Visit: 1 Visit OT Treatments $Self Care/Home Management : 8-22 mins $Therapeutic Activity: 8-22 mins  Jefferey Pica, OTR/L Acute Rehabilitation Services Pager: [phone] Office: [phone]    Leno Mathes  C 11/08/2019, 4:28 PM

## 2019-11-08 NOTE — Progress Notes (Signed)
Patient ID: DAILEN MCCLISH, male   DOB: Dec 03, 1955, 64 y.o.   MRN: [id number] S: Feels tired. O:BP 100/70   Pulse (!) 105   Temp 98 F (36.7 C) (Axillary)   Resp (!) 26   Ht 5' 6.5" (1.689 m)   Wt 102.9 kg   SpO2 95%   BMI 36.07 kg/m   Intake/Output Summary (Last 24 hours) at 11/08/2019 0948 Last data filed at 11/08/2019 0900 Gross per 24 hour  Intake 1037.87 ml  Output 1542 ml  Net -504.13 ml   Intake/Output: I/O last 3 completed shifts: In: 1806.3 [P.O.:60; I.V.:1096.2; IV Piggyback:650.1] Out: 6283 [Other:1442]  Intake/Output this shift:  Total I/O In: 53.9 [I.V.:53.9] Out: 100 [Other:100] Weight change: -2.9 kg Gen: appears fatigued and volume overloaded CVS: tachy Resp: decreased BS at bases Abd: +BS, soft, NT Ext: 1+ anasarca  Recent Labs  Lab 11/05/19 0343 11/05/19 1613 11/06/19 0318 11/06/19 1638 11/07/19 0110 11/07/19 1642 11/08/19 0059  NA 135 134* 136 134* 134* 133* 135  K 4.1 4.3 4.4 4.8 4.8 4.9 4.7  CL 97* 97* 98 99 99 99 104  CO2 25 24 25  20* 17* 22 21*  GLUCOSE 156* 147* 125* 139* 132* 141* 131*  BUN 37* 39* 21 24* 31* 29* 25*  CREATININE 5.70* 5.82* 3.42* 4.00* 4.98* 4.50* 4.26*  ALBUMIN 2.3* 2.3* 2.4* 2.1* 2.2* 2.2* <1.0*  CALCIUM 7.0* 6.8* 7.6* 7.1* 6.8* 6.8* 5.9*  PHOS 1.8* 2.2* 1.8* 2.5 2.1* 2.4* 2.0*   Liver Function Tests: Recent Labs  Lab 11/07/19 0110 11/07/19 1642 11/08/19 0059  ALBUMIN 2.2* 2.2* <1.0*   No results for input(s): LIPASE, AMYLASE in the last 168 hours. No results for input(s): AMMONIA in the last 168 hours. CBC: Recent Labs  Lab 11/04/19 0312 11/04/19 0312 11/05/19 0343 11/05/19 0343 11/06/19 0318 11/07/19 0110 11/08/19 0414  WBC 10.9*   < > 10.8*   < > 15.5* 14.7* 14.5*  HGB 9.6*   < > 9.1*   < > 10.1* 8.5* 8.5*  HCT 31.3*   < > 28.8*   < > 32.3* 26.0* 27.1*  MCV 99.1  --  96.3  --  97.3 94.2 98.2  PLT 64*   < > 85*   < > 102* 118* 142*   < > = values in this interval not displayed.   Cardiac  Enzymes: No results for input(s): CKTOTAL, CKMB, CKMBINDEX, TROPONINI in the last 168 hours. CBG: Recent Labs  Lab 11/07/19 0642 11/07/19 1125 11/07/19 1551 11/07/19 2141 11/08/19 0637  GLUCAP 127* 137* 142* 119* 106*    Iron Studies: No results for input(s): IRON, TIBC, TRANSFERRIN, FERRITIN in the last 72 hours. Studies/Results: DG Chest 2 View  Result Date: 11/07/2019 CLINICAL DATA:  64 year old male status post open heart surgery. Shortness of breath. Chest pain. EXAM: CHEST - 2 VIEW COMPARISON:  Chest x-ray 11/06/2019. FINDINGS: Left internal jugular PermCath with tip terminating at the superior cavoatrial junction. There is a right-sided internal jugular central venous catheter with tip terminating in the distal superior vena cava. Lung volumes are low. Bibasilar opacities (left greater than right), favored to reflect resolving areas of postoperative subsegmental atelectasis. Small bilateral pleural effusions (left greater than right). No pneumothorax. No evidence of pulmonary edema. Cardiopericardial silhouette appears enlarged, but similar to prior postoperative examinations. Aortic atherosclerosis. Status post median sternotomy for CABG. IMPRESSION: 1. Postoperative changes and support apparatus, as above. 2. Low lung volumes with bibasilar areas of postoperative atelectasis and small bilateral pleural effusions (  left greater than right). 3. Aortic atherosclerosis. Electronically Signed   By: Vinnie Langton M.D.   On: 11/07/2019 06:12   DG CHEST PORT 1 VIEW  Result Date: 11/08/2019 CLINICAL DATA:  Shortness of breath.  Post open heart surgery. EXAM: PORTABLE CHEST 1 VIEW COMPARISON:  11/08/2019 earlier. FINDINGS: Left IJ central venous catheter unchanged with tip over the right atrium. Patient is slightly rotated to the left. Post median sternotomy. Right IJ central venous catheter has tip over the SVC unchanged. Lungs are somewhat hypoinflated with mild stable bibasilar opacification  likely atelectasis and small amount of bilateral pleural fluid. Stable cardiomegaly. Remainder the exam is unchanged. IMPRESSION: Hypoinflation with stable bibasilar opacification likely small effusions with atelectasis. Stable cardiomegaly. Tubes and lines as described. Electronically Signed   By: Marin Olp M.D.   On: 11/08/2019 08:04   . apixaban  5 mg Oral BID  . bisacodyl  10 mg Oral Daily   Or  . bisacodyl  10 mg Rectal Daily  . Chlorhexidine Gluconate Cloth  6 each Topical Daily  . clopidogrel  75 mg Oral Daily  . darbepoetin (ARANESP) injection - NON-DIALYSIS  60 mcg Subcutaneous Q Sat-1800  . docusate sodium  100 mg Oral Daily  . feeding supplement (NEPRO CARB STEADY)  237 mL Oral BID BM  . insulin aspart  0-9 Units Subcutaneous TID AC & HS  . mouth rinse  15 mL Mouth Rinse BID  . midodrine  15 mg Oral TID WC  . multivitamin  1 tablet Oral QHS  . pantoprazole  40 mg Oral Daily  . rosuvastatin  10 mg Oral q1800    BMET    Component Value Date/Time   NA 135 11/08/2019 0059   NA 139 11/30/2017 1008   K 4.7 11/08/2019 0059   CL 104 11/08/2019 0059   CO2 21 (L) 11/08/2019 0059   GLUCOSE 131 (H) 11/08/2019 0059   BUN 25 (H) 11/08/2019 0059   BUN 48 (H) 11/30/2017 1008   CREATININE 4.26 (H) 11/08/2019 0059   CALCIUM 5.9 (LL) 11/08/2019 0059   GFRNONAA 14 (L) 11/08/2019 0059   GFRAA 16 (L) 11/08/2019 0059   CBC    Component Value Date/Time   WBC 14.5 (H) 11/08/2019 0414   RBC 2.76 (L) 11/08/2019 0414   HGB 8.5 (L) 11/08/2019 0414   HGB 11.9 (L) 11/30/2017 1008   HCT 27.1 (L) 11/08/2019 0414   HCT 35.3 (L) 11/30/2017 1008   PLT 142 (L) 11/08/2019 0414   PLT 197 11/30/2017 1008   MCV 98.2 11/08/2019 0414   MCV 92 11/30/2017 1008   MCH 30.8 11/08/2019 0414   MCHC 31.4 11/08/2019 0414   RDW 20.1 (H) 11/08/2019 0414   RDW 16.8 (H) 11/30/2017 1008   LYMPHSABS 1.0 10/24/2019 0737   MONOABS 0.7 10/24/2019 0737   EOSABS 0.2 10/24/2019 0737   BASOSABS 0.1 10/24/2019  0737   [facility] 4h 99.5 2K/3.5Ca P4 TDC Hep 3000 - mircera 75 last 3/22 - hect 1   Assessment/ Plan: 1. NSTEMI/CAD s/p redo CABG 11/30/41 complicated by hypotension and volume overload.   2. Cardiogenic shock - recurrent , back on pressors 3. Resp failure - suspected pulm edema d/t vol excess. Better. 4. ESRD - usual HD MWF, sp CRRT 4/1 due to ongoing issues with hypotension and volume overload.  Was doing well on CVVHD until the evening of 11/06/19 when he developed a fib/RVR and VTach with hypotension.  CVVHD was stopped that night and then  resumed 11/07/19.  He has done well overnight but no UF.  Will increase UF goal to 50-100 ml/hr and hope that he doesn't have a recurrence of tachycardia and hypotension.  5. Anemia abl/ ckd - tsat 16%, ferr 1293. Due for esa on 4/5,startedAranesp 60 qwk SQ 1st dose 4/10.  If continues to fall will need blood transfusion. 6. S/p AVR- good valve function per ECHO. 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  Will replete with IV sodium phos 8. Protein malnutrition- severe with low albumin.  Will give IV albumin to help with bp and HR with UF.   Donetta Potts, MD [employer] [phone]

## 2019-11-08 NOTE — Progress Notes (Addendum)
Progress Note  Patient Name: Patrick [name] Date of Encounter: 11/08/2019  Primary Cardiologist: Dorris Carnes, MD   Subjective   Mild dyspnea; no CP  Inpatient Medications    Scheduled Meds: . amiodarone  400 mg Oral BID  . apixaban  5 mg Oral BID  . aspirin  81 mg Oral [name]  . bisacodyl  10 mg Oral [name]   Or  . bisacodyl  10 mg Rectal [name]  . Chlorhexidine Gluconate Cloth  6 each Topical [name]  . clopidogrel  75 mg Oral [name]  . darbepoetin (ARANESP) injection - NON-DIALYSIS  60 mcg Subcutaneous Q Sat-1800  . docusate sodium  100 mg Oral [name]  . feeding supplement (NEPRO CARB STEADY)  237 mL Oral BID BM  . insulin aspart  0-9 Units Subcutaneous TID AC & HS  . mouth rinse  15 mL Mouth Rinse BID  . midodrine  15 mg Oral TID WC  . multivitamin  1 tablet Oral QHS  . pantoprazole  40 mg Oral [name]  . rosuvastatin  10 mg Oral q1800   Continuous Infusions: .  prismasol BGK 4/2.5 400 mL/hr at 11/07/19 0930  .  prismasol BGK 4/2.5 200 mL/hr at 11/07/19 0930  . sodium chloride Stopped (11/06/19 2251)  . heparin 10,000 units/ 20 mL infusion syringe 500 Units/hr (11/08/19 0225)  . meropenem (MERREM) IV Stopped (11/08/19 0636)  . norepinephrine (LEVOPHED) Adult infusion 20 mcg/min (11/08/19 0700)  . prismasol BGK 4/2.5 1,800 mL/hr at 11/08/19 0226  . vancomycin     PRN Meds: sodium chloride, acetaminophen, alteplase, dextrose, fentaNYL (SUBLIMAZE) injection, Gerhardt's butt cream, heparin, levalbuterol, magic mouthwash w/lidocaine, metoprolol tartrate, ondansetron (ZOFRAN) IV, oxyCODONE, sodium chloride, sodium chloride flush, traMADol   Vital Signs    Vitals:   11/08/19 0400 11/08/19 0500 11/08/19 0600 11/08/19 0700  BP: 120/71 (!) 94/58 120/69 (!) 82/51  Pulse: (!) 106 (!) 129 (!) 106 (!) 105  Resp: 19 (!) 31 (!) 21 (!) 23  Temp: 98 F (36.7 C)     TempSrc: Axillary     SpO2: 96% 96% 97% 96%  Weight:  102.9 kg    Height:        Intake/Output Summary (Last 24  hours) at 11/08/2019 0729 Last data filed at 11/08/2019 0700 Gross per 24 hour  Intake 1186.21 ml  Output 1442 ml  Net -255.79 ml   Last 3 Weights 11/08/2019 11/07/2019 11/06/2019  Weight (lbs) 226 lb 13.7 oz 233 lb 4 oz 226 lb 6.6 oz  Weight (kg) 102.9 kg 105.8 kg 102.7 kg      Telemetry    AV paced with PAF with RVR- Personally Reviewed   Physical Exam   GEN: No acute distress.  WD obese Neck: supple, no JVD Cardiac: RRR Respiratory: mildly diminished BS bases; no wheeze GI: Soft, NT/ND MS: No edema Neuro:  Grossly intact Psych: Normal affect   Labs    High Sensitivity Troponin:   Recent Labs  Lab 10/24/19 0737  TROPONINIHS 2,995*      Chemistry Recent Labs  Lab 11/01/19 0751 11/01/19 0803 11/07/19 0110 11/07/19 1642 11/08/19 0059  NA 138   < > 134* 133* 135  K 4.2   < > 4.8 4.9 4.7  CL 104   < > 99 99 104  CO2 25   < > 17* 22 21*  GLUCOSE 139*   < > 132* 141* 131*  BUN 23   < > 31* 29* 25*  CREATININE 2.35*   < >  4.98* 4.50* 4.26*  CALCIUM 7.4*   < > 6.8* 6.8* 5.9*  PROT 5.2*  --   --   --   --   ALBUMIN 2.3*   < > 2.2* 2.2* <1.0*  AST 102*  --   --   --   --   ALT 54*  --   --   --   --   ALKPHOS 67  --   --   --   --   BILITOT 0.9  --   --   --   --   GFRNONAA 28*   < > 11* 13* 14*  GFRAA 32*   < > 13* 15* 16*  ANIONGAP 9   < > 18* 12 10   < > = values in this interval not displayed.     Hematology Recent Labs  Lab 11/06/19 0318 11/07/19 0110 11/08/19 0414  WBC 15.5* 14.7* 14.5*  RBC 3.32* 2.76* 2.76*  HGB 10.1* 8.5* 8.5*  HCT 32.3* 26.0* 27.1*  MCV 97.3 94.2 98.2  MCH 30.4 30.8 30.8  MCHC 31.3 32.7 31.4  RDW 20.2* 19.7* 20.1*  PLT 102* 118* 142*    Radiology    DG Chest 2 View  Result Date: 11/07/2019 CLINICAL DATA:  64 year old male status post open heart surgery. Shortness of breath. Chest pain. EXAM: CHEST - 2 VIEW COMPARISON:  Chest x-ray 11/06/2019. FINDINGS: Left internal jugular PermCath with tip terminating at the superior  cavoatrial junction. There is a right-sided internal jugular central venous catheter with tip terminating in the distal superior vena cava. Lung volumes are low. Bibasilar opacities (left greater than right), favored to reflect resolving areas of postoperative subsegmental atelectasis. Small bilateral pleural effusions (left greater than right). No pneumothorax. No evidence of pulmonary edema. Cardiopericardial silhouette appears enlarged, but similar to prior postoperative examinations. Aortic atherosclerosis. Status post median sternotomy for CABG. IMPRESSION: 1. Postoperative changes and support apparatus, as above. 2. Low lung volumes with bibasilar areas of postoperative atelectasis and small bilateral pleural effusions (left greater than right). 3. Aortic atherosclerosis. Electronically Signed   By: Vinnie Langton M.D.   On: 11/07/2019 06:12    Patient Profile     64 year old gentleman with a history of coronary artery disease, end-stage renal disease on dialysis underwent coronary artery bypass and graft on April 1 with saphenous vein graft to the LAD, saphenous vein graft to the PDA obtuse marginal; aortic valve replacement with bioprosthetic aortic valve.  Postoperative echocardiogram shows ejection fraction 35 to 70%, grade 2 diastolic dysfunction, mild left atrial enlargement, aortic valve replacement with normal function.  Assessment & Plan    1 postoperative atrial fibrillation/PAT-patient continues to have paroxysmal atrial fibrillation with rapid ventricular response.  His blood pressure is low and requiring norepinephrine.  Will change amiodarone to IV (150 mg bolus then 30 mg/h).  Continue apixaban.  2 ischemic cardiomyopathy-ejection fraction 35 to 40% on most recent echocardiogram.  However blood pressure will not allow additional cardiac medications including hydralazine/nitrates or beta-blocker.   3 postoperative volume excess-dialysis per nephrology.  4 coronary artery disease  status post non-ST elevation myocardial infarction-continue Plavix and statin; DC ASA.  Will likely discontinue aspirin in 2 weeks given need for apixaban.  5 end-stage renal disease-dialysis per nephrology.  6 fever-continue antibiotics.  7 s/p AVR-postoperative echocardiogram shows normally functioning valve.  Remains hypotensive.  Continue midodrine.  Wean norepinephrine as tolerated.  For questions or updates, please contact [name] Please consult [email or web address] for contact info under  Signed, Kirk Ruths, MD  11/08/2019, 7:29 AM

## 2019-11-08 NOTE — Progress Notes (Signed)
12 Days Post-Op Procedure(s) (LRB): REDO CORONARY ARTERY BYPASS GRAFTING TIMES 3  USING LEFT GREATER SAPHENOUS LEG VEIN HARVESTED ENDOSCOPICALLY(CABG) (N/A) TRANSESOPHAGEAL ECHOCARDIOGRAM (TEE) (N/A) AORTIC VALVE REPLACEMENT (AVR) USING EDWARDS INTUITY 23 MM AORTIC VALVE. (N/A) Endovein Harvest Of Greater Saphenous Vein (Left) Subjective: No complaints Objective: Vital signs in last 24 hours: Temp:  [98 F (36.7 C)-98.4 F (36.9 C)] 98 F (36.7 C) (04/13 0400) Pulse Rate:  [73-129] 105 (04/13 0700) Cardiac Rhythm: A-V Sequential paced (04/13 0400) Resp:  [16-34] 23 (04/13 0700) BP: (82-145)/(51-104) 82/51 (04/13 0700) SpO2:  [90 %-100 %] 96 % (04/13 0700) Weight:  [102.9 kg] 102.9 kg (04/13 0500)  Hemodynamic parameters for last 24 hours:    Intake/Output from previous day: 04/12 0701 - 04/13 0700 In: 1186.2 [P.O.:60; I.V.:687.7; IV Piggyback:438.5] Out: 1442  Intake/Output this shift: No intake/output data recorded.  General appearance: alert Neurologic: intact Heart: normal apical impulse Lungs: clear to auscultation bilaterally Extremities: edema 3+ Wound: c/d/i  Lab Results: Recent Labs    11/07/19 0110 11/08/19 0414  WBC 14.7* 14.5*  HGB 8.5* 8.5*  HCT 26.0* 27.1*  PLT 118* 142*   BMET:  Recent Labs    11/07/19 1642 11/08/19 0059  NA 133* 135  K 4.9 4.7  CL 99 104  CO2 22 21*  GLUCOSE 141* 131*  BUN 29* 25*  CREATININE 4.50* 4.26*  CALCIUM 6.8* 5.9*    PT/INR: No results for input(s): LABPROT, INR in the last 72 hours. ABG    Component Value Date/Time   PHART 7.416 11/01/2019 0803   HCO3 29.2 (H) 11/01/2019 0803   TCO2 31 11/01/2019 0803   ACIDBASEDEF 4.0 (H) 10/28/2019 1825   O2SAT 99.0 11/01/2019 0803   CBG (last 3)  Recent Labs    11/07/19 1551 11/07/19 2141 11/08/19 0637  GLUCAP 142* 119* 106*    Assessment/Plan: S/P Procedure(s) (LRB): REDO CORONARY ARTERY BYPASS GRAFTING TIMES 3  USING LEFT GREATER SAPHENOUS LEG VEIN  HARVESTED ENDOSCOPICALLY(CABG) (N/A) TRANSESOPHAGEAL ECHOCARDIOGRAM (TEE) (N/A) AORTIC VALVE REPLACEMENT (AVR) USING EDWARDS INTUITY 23 MM AORTIC VALVE. (N/A) Endovein Harvest Of Greater Saphenous Vein (Left) Mobilize Diuresis Continue ABX therapy due to Post-op infection   LOS: 15 days    Wonda Olds 11/08/2019

## 2019-11-08 NOTE — Progress Notes (Signed)
Inpatient Rehabilitation Admissions Coordinator  Inpatient rehab consult received.Noted pt remains on CRRT. I will follow up with patient and family to begin rehab assessment.  Danne Baxter, RN, MSN Rehab Admissions Coordinator [phone] 11/08/2019 2:45 PM

## 2019-11-08 NOTE — Progress Notes (Signed)
Transitions of Care Pharmacist Note  Patrick [name] is a 64 y.o. male that has been diagnosed with A Fib and will be prescribed Eliquis (apixaban) at discharge.   Patient Education: I provided the following education on Apixaban to the patient: How to take the medication Described what the medication is Signs of bleeding Signs/symptoms of VTE and stroke  Answered their questions  Discharge Medications Plan: The patient wants to have their discharge medications filled by the Transitions of Care pharmacy rather than their usual pharmacy.  The discharge orders pharmacy has been changed to the Transitions of Care pharmacy, the patient will receive a phone call regarding co-pay, and their medications will be delivered by the Transitions of Care pharmacy.    Thank you,   Lorel Monaco, PharmD PGY1 Ambulatory Care Resident [facility] # [phone]  November 08, 2019

## 2019-11-08 NOTE — Progress Notes (Signed)
  Speech Language Pathology Treatment: Dysphagia  Patient Details Name: Patrick [name] MRN: [id number] DOB: 1956/06/09 Today's Date: 11/08/2019 Time: 0347-4259 SLP Time Calculation (min) (ACUTE ONLY): 12 min  Assessment / Plan / Recommendation Clinical Impression  F/u for dysphagia.  Wife at bedside. Pt reports poor appetite; however, overall swallowing mechanics appear to be functional with no overt s/s of aspiration with liquids; adequate mastication of regular solids.  Pt reports mouth pain - upon examination, he has three ulcers on anterior tongue and one on hard palate.  RR fluctuated between 28-41 during session.  We discussed the importance of coordinating breathing and swallowing and taking frequent rest breaks while eating and drinking. Pt/wife verbalize understanding.    Recommend advancing diet to regular solids (HH/carb mod) - this will at least improve food options. No further swallowing needs are identified - SLP will sign off.    HPI HPI: 64 year old male with PMH of CABG in 1997 s/p stent, ESRD on HD s/p renal transplant, presented to [facility] 3/29 with CP, SOB. Dx NSTEMI and multivessel CAD. Underwent 3vCABG and AVR 4/1 and balloon pump placement. Intubated 4/1 for surgery, extubated 4/4.       SLP Plan  All goals met       Recommendations  Diet recommendations: Regular;Thin liquid Liquids provided via: Cup;Straw Medication Administration: Whole meds with liquid Supervision: Patient able to self feed Compensations: Slow rate                Oral Care Recommendations: Oral care BID SLP Visit Diagnosis: Dysphagia, unspecified (R13.10) Plan: All goals met       GO              Amanda L. Tivis Ringer, [place] CCC/SLP Acute Rehabilitation Services Office number [phone] Pager [phone]   Juan Quam Laurice 11/08/2019, 10:26 AM

## 2019-11-08 NOTE — Progress Notes (Signed)
Nutrition Follow-up  DOCUMENTATION CODES:   Not applicable  INTERVENTION:   Ensure Enlive po TID, each supplement provides 350 kcal and 20 grams of protein  Magic cup BID with meals, each supplement provides 290 kcal and 9 grams of protein  Liberalize diet to REGULAR while po intake poor, pt on CRRT  Continue Rena-Vit daily   NUTRITION DIAGNOSIS:   Inadequate oral intake related to acute illness as evidenced by NPO status.  Being addressed as diet advanced, supplements  GOAL:   Patient will meet greater than or equal to 90% of their needs  Progressing  MONITOR:   PO intake, Diet advancement, Supplement acceptance, Labs, Weight trends, Skin  REASON FOR ASSESSMENT:   Ventilator    ASSESSMENT:   64 yo male admitted with NSTEMI and found to have mutlivessel CAD and underwent CABG on 4/1, ESRD/HD requiring CRRT. PMH includes ESRD on HD s/p renal transplant, CAD s/p CABG, CHF, DM   3/29 Admission 3/30 Cardiac Cath 4/01 CABG with IABP placement(removed 4/3), Intubated 4/02 CRRT initiated 4/04 Extubated 4/05 Cortrak placed,tip in stomach;okay to feed gastric per RD discussion with Dr. Orvan Seen 4/07 CRRT discontinued, ileus, NPO, Cortrak pulled out by pt 4/08 CL diet in AM, FL diet in PM 4/09 iHD with 1.3 L removed 4/10 CRRT re-initiated 4/11 Diet advanced to Dysphagia III 4/13 Diet advanced to Heart Healthy, Carb Modified  Pt reports poor appetite, did not eat much breakfast this AM. Per RN, pt only drank liquids off of meal tray including the juice from the canned pears. Pt receiving Nepro BID but does not like. Pt denies any N/V/abdominal pain. No recorded po intake since 4/11, pt ate 50% of lunch that day. Reinforced importance of adequate nutrition; discussed options for ONS and pt agreeable to Ensure and Magic Cup.   Pt with sores inside his mouth which are painful and pt reports this is impacting po intake.   Given poor po intake in combination with elevated  nutritional needs given acute illness, on CRRT, recommend liberalizing diet to Regular  Outpt EDW 99.5 kg; current wt 102.9 kg  Labs: phosphorus 2.0 (L), potassium 4.7 (wdl), sodium 135 (wdl), albumin <1.0, corrected calcium 8.4  Meds: aranesp, colace, Rena-Vit, sodium phosphate  Diet Order:   Diet Order            Diet regular Room service appropriate? Yes; Fluid consistency: Thin  Diet effective now              EDUCATION NEEDS:   No education needs have been identified at this time  Skin:  Skin Assessment: Skin Integrity Issues: Skin Integrity Issues:: DTI DTI: multiple toes  Last BM:  4/12  Height:   Ht Readings from Last 1 Encounters:  10/28/19 5' 6.5" (1.689 m)    Weight:   Wt Readings from Last 1 Encounters:  11/08/19 102.9 kg    Ideal Body Weight:     BMI:  Body mass index is 36.07 kg/m.  Estimated Nutritional Needs:   Kcal:  2300-2700  Protein:  120-140 gm  Fluid:  1000 plus UOP   Kerman Passey MS, RDN, LDN, CNSC RD Pager Number and Weekend/On-Call After Hours Pager Located in [place]

## 2019-11-09 ENCOUNTER — Inpatient Hospital Stay (HOSPITAL_COMMUNITY): Payer: Medicare HMO

## 2019-11-09 DIAGNOSIS — I214 Non-ST elevation (NSTEMI) myocardial infarction: Secondary | ICD-10-CM | POA: Diagnosis not present

## 2019-11-09 DIAGNOSIS — I5031 Acute diastolic (congestive) heart failure: Secondary | ICD-10-CM

## 2019-11-09 LAB — COOXEMETRY PANEL
Carboxyhemoglobin: 1.2 % (ref 0.5–1.5)
Methemoglobin: 0.7 % (ref 0.0–1.5)
O2 Saturation: 61.5 %
Total hemoglobin: 7.7 g/dL — ABNORMAL LOW (ref 12.0–16.0)

## 2019-11-09 LAB — POCT I-STAT 7, (LYTES, BLD GAS, ICA,H+H)
Acid-Base Excess: 2 mmol/L (ref 0.0–2.0)
Bicarbonate: 25.7 mmol/L (ref 20.0–28.0)
Calcium, Ion: 1.01 mmol/L — ABNORMAL LOW (ref 1.15–1.40)
HCT: 26 % — ABNORMAL LOW (ref 39.0–52.0)
Hemoglobin: 8.8 g/dL — ABNORMAL LOW (ref 13.0–17.0)
O2 Saturation: 100 %
Patient temperature: 98.1
Potassium: 4.4 mmol/L (ref 3.5–5.1)
Sodium: 134 mmol/L — ABNORMAL LOW (ref 135–145)
TCO2: 27 mmol/L (ref 22–32)
pCO2 arterial: 36.8 mmHg (ref 32.0–48.0)
pH, Arterial: 7.45 (ref 7.350–7.450)
pO2, Arterial: 206 mmHg — ABNORMAL HIGH (ref 83.0–108.0)

## 2019-11-09 LAB — RENAL FUNCTION PANEL
Albumin: 2.1 g/dL — ABNORMAL LOW (ref 3.5–5.0)
Albumin: 2 g/dL — ABNORMAL LOW (ref 3.5–5.0)
Anion gap: 11 (ref 5–15)
Anion gap: 11 (ref 5–15)
BUN: 13 mg/dL (ref 8–23)
BUN: 12 mg/dL (ref 8–23)
CO2: 24 mmol/L (ref 22–32)
CO2: 23 mmol/L (ref 22–32)
Calcium: 7.3 mg/dL — ABNORMAL LOW (ref 8.9–10.3)
Calcium: 7 mg/dL — ABNORMAL LOW (ref 8.9–10.3)
Chloride: 99 mmol/L (ref 98–111)
Chloride: 100 mmol/L (ref 98–111)
Creatinine, Ser: 2.46 mg/dL — ABNORMAL HIGH (ref 0.61–1.24)
Creatinine, Ser: 2.21 mg/dL — ABNORMAL HIGH (ref 0.61–1.24)
GFR calc Af Amer: 31 mL/min — ABNORMAL LOW (ref >60)
GFR calc Af Amer: 35 mL/min — ABNORMAL LOW (ref >60)
GFR calc non Af Amer: 26 mL/min — ABNORMAL LOW (ref >60)
GFR calc non Af Amer: 30 mL/min — ABNORMAL LOW (ref >60)
Glucose, Bld: 159 mg/dL — ABNORMAL HIGH (ref 70–99)
Glucose, Bld: 260 mg/dL — ABNORMAL HIGH (ref 70–99)
Phosphorus: 1.9 mg/dL — ABNORMAL LOW (ref 2.5–4.6)
Phosphorus: 1.6 mg/dL — ABNORMAL LOW (ref 2.5–4.6)
Potassium: 4.4 mmol/L (ref 3.5–5.1)
Potassium: 4.8 mmol/L (ref 3.5–5.1)
Sodium: 134 mmol/L — ABNORMAL LOW (ref 135–145)
Sodium: 134 mmol/L — ABNORMAL LOW (ref 135–145)

## 2019-11-09 LAB — GLUCOSE, CAPILLARY
Glucose-Capillary: 137 mg/dL — ABNORMAL HIGH (ref 70–99)
Glucose-Capillary: 127 mg/dL — ABNORMAL HIGH (ref 70–99)
Glucose-Capillary: 140 mg/dL — ABNORMAL HIGH (ref 70–99)
Glucose-Capillary: 164 mg/dL — ABNORMAL HIGH (ref 70–99)

## 2019-11-09 LAB — ECHOCARDIOGRAM LIMITED
Height: 66.5 in
Weight: 3594.38 oz

## 2019-11-09 LAB — APTT: aPTT: 56 seconds — ABNORMAL HIGH (ref 24–36)

## 2019-11-09 LAB — MAGNESIUM: Magnesium: 2.5 mg/dL — ABNORMAL HIGH (ref 1.7–2.4)

## 2019-11-09 MED ORDER — LIDOCAINE HCL URETHRAL/MUCOSAL 2 % EX GEL
1.0000 "application " | CUTANEOUS | Status: AC
  Administered 2019-11-09: 1
  Filled 2019-11-09: qty 20

## 2019-11-09 MED ORDER — PRO-STAT SUGAR FREE PO LIQD
30.0000 mL | Freq: Two times a day (BID) | ORAL | Status: DC
  Administered 2019-11-09 – 2019-11-14 (×10): 30 mL via ORAL
  Filled 2019-11-09 (×11): qty 30

## 2019-11-09 MED ORDER — PERFLUTREN LIPID MICROSPHERE
1.0000 mL | INTRAVENOUS | Status: AC | PRN
  Administered 2019-11-09: 3 mL via INTRAVENOUS
  Filled 2019-11-09: qty 10

## 2019-11-09 MED ORDER — SODIUM CHLORIDE 0.9 % IV SOLN
1.0000 g | Freq: Three times a day (TID) | INTRAVENOUS | Status: AC
  Administered 2019-11-10 – 2019-11-12 (×9): 1 g via INTRAVENOUS
  Filled 2019-11-09 (×9): qty 1

## 2019-11-09 MED ORDER — VITAL 1.5 CAL PO LIQD
1000.0000 mL | ORAL | Status: DC
  Administered 2019-11-09: 1000 mL
  Filled 2019-11-09: qty 1000

## 2019-11-09 MED ORDER — SODIUM PHOSPHATES 45 MMOLE/15ML IV SOLN
30.0000 mmol | Freq: Once | INTRAVENOUS | Status: AC
  Administered 2019-11-09: 30 mmol via INTRAVENOUS
  Filled 2019-11-09: qty 10

## 2019-11-09 NOTE — Procedures (Signed)
I was present at this CVVHD dialysis session. I have reviewed the session itself and made appropriate changes.  Will increase UF rate to 100-150 ml/min due to worsening respiratory status today.   Vital signs in last 24 hours:  Temp:  [97.6 F (36.4 C)-98.1 F (36.7 C)] 97.6 F (36.4 C) (04/14 0758) Pulse Rate:  [87-106] 100 (04/14 0715) Resp:  [19-40] 23 (04/14 0715) BP: (82-140)/(33-113) 112/68 (04/14 0715) SpO2:  [86 %-100 %] 99 % (04/14 0735) FiO2 (%):  [50 %] 50 % (04/14 0435) Weight:  [101.9 kg] 101.9 kg (04/14 0500) Weight change: -1 kg Filed Weights   11/07/19 0500 11/08/19 0500 11/09/19 0500  Weight: 105.8 kg 102.9 kg 101.9 kg    Recent Labs  Lab 11/09/19 0411  NA 134*  K 4.4  CL 99  CO2 24  GLUCOSE 159*  BUN 13  CREATININE 2.46*  CALCIUM 7.3*  PHOS 1.9*    Recent Labs  Lab 11/06/19 0318 11/06/19 0318 11/07/19 0110 11/08/19 0414 11/09/19 0012  WBC 15.5*  --  14.7* 14.5*  --   HGB 10.1*   < > 8.5* 8.5* 8.8*  HCT 32.3*   < > 26.0* 27.1* 26.0*  MCV 97.3  --  94.2 98.2  --   PLT 102*  --  118* 142*  --    < > = values in this interval not displayed.    Scheduled Meds: . apixaban  5 mg Oral BID  . bisacodyl  10 mg Oral Daily   Or  . bisacodyl  10 mg Rectal Daily  . Chlorhexidine Gluconate Cloth  6 each Topical Daily  . clopidogrel  75 mg Oral Daily  . darbepoetin (ARANESP) injection - NON-DIALYSIS  60 mcg Subcutaneous Q Sat-1800  . docusate sodium  100 mg Oral Daily  . feeding supplement (ENSURE ENLIVE)  237 mL Oral TID BM  . insulin aspart  0-9 Units Subcutaneous TID AC & HS  . mouth rinse  15 mL Mouth Rinse BID  . midodrine  15 mg Oral TID WC  . multivitamin  1 tablet Oral QHS  . pantoprazole  40 mg Oral Daily  . rosuvastatin  10 mg Oral q1800   Continuous Infusions: .  prismasol BGK 4/2.5 400 mL/hr at 11/09/19 0011  .  prismasol BGK 4/2.5 200 mL/hr at 11/08/19 1145  . sodium chloride 10 mL/hr at 11/09/19 0700  . amiodarone 30 mg/hr (11/09/19  0700)  . heparin 10,000 units/ 20 mL infusion syringe 500 Units/hr (11/08/19 0225)  . meropenem (MERREM) IV Stopped (11/09/19 7510)  . norepinephrine (LEVOPHED) Adult infusion 17 mcg/min (11/09/19 0700)  . prismasol BGK 4/2.5 1,800 mL/hr at 11/09/19 0625  . vancomycin Stopped (11/08/19 2131)   PRN Meds:.sodium chloride, acetaminophen, alteplase, dextrose, fentaNYL (SUBLIMAZE) injection, Gerhardt's butt cream, heparin, levalbuterol, magic mouthwash w/lidocaine, metoprolol tartrate, ondansetron (ZOFRAN) IV, oxyCODONE, perflutren lipid microspheres (DEFINITY) IV suspension, sodium chloride, sodium chloride flush, traMADol   Donetta Potts,  MD 11/09/2019, 9:04 AM

## 2019-11-09 NOTE — Progress Notes (Signed)
Physical Therapy Treatment Patient Details Name: Patrick [name] MRN: [id number] DOB: Feb 22, 1956 Today's Date: 11/09/2019    History of Present Illness Pt is a 64 yo male s/p re-do CABGx3 with Aortic valve replacement. Pt has been on CRRT but removed 4/11 due to hypotension and VTach with pending return to CRRT 4/12. PMHx: ESRD MWF, HTN, PNA, T2DM, MI.    PT Comments    Pt fatigued and performed ex's in chair position in bed with encouragement. Slow progress.    Follow Up Recommendations  CIR;Supervision/Assistance - 24 hour     Equipment Recommendations  Rolling walker with 5" wheels    Recommendations for Other Services Rehab consult     Precautions / Restrictions Precautions Precautions: Fall;Sternal Precaution Booklet Issued: No Precaution Comments: external pacer, flexiseal, CRRT     Mobility  Bed Mobility               General bed mobility comments: Pt up in chair position in bed  Transfers                    Ambulation/Gait                 Stairs             Wheelchair Mobility    Modified Rankin (Stroke Patients Only)       Balance                                            Cognition Arousal/Alertness: Awake/alert(sleepy) Behavior During Therapy: Flat affect Overall Cognitive Status: Within Functional Limits for tasks assessed                                        Exercises General Exercises - Lower Extremity Ankle Circles/Pumps: AROM;Both;10 reps Quad Sets: Strengthening;Both;10 reps;Supine Short Arc Quad: AAROM;Both;10 reps;Supine Heel Slides: AAROM;Both;10 reps;Supine Hip ABduction/ADduction: AAROM;Both;10 reps;Supine    General Comments        Pertinent Vitals/Pain Pain Assessment: No/denies pain    Home Living                      Prior Function            PT Goals (current goals can now be found in the care plan section) Progress towards PT goals:  Progressing toward goals    Frequency    Min 3X/week      PT Plan Current plan remains appropriate    Co-evaluation              AM-PAC PT "6 Clicks" Mobility   Outcome Measure  Help needed turning from your back to your side while in a flat bed without using bedrails?: A Little Help needed moving from lying on your back to sitting on the side of a flat bed without using bedrails?: A Lot Help needed moving to and from a bed to a chair (including a wheelchair)?: A Lot Help needed standing up from a chair using your arms (e.g., wheelchair or bedside chair)?: A Lot Help needed to walk in hospital room?: A Lot Help needed climbing 3-5 steps with a railing? : Total 6 Click Score: 12    End of Session Equipment Utilized During Treatment: Oxygen Activity Tolerance: Patient limited  by fatigue Patient left: in bed;with call bell/phone within reach;with family/visitor present   PT Visit Diagnosis: Other abnormalities of gait and mobility (R26.89);Muscle weakness (generalized) (M62.81)     Time: 8811-0315 PT Time Calculation (min) (ACUTE ONLY): 14 min  Charges:  $Therapeutic Exercise: 8-22 mins                     [place] Pager [phone] Office [place] 11/09/2019, 4:42 PM

## 2019-11-09 NOTE — Progress Notes (Signed)
EVENING ROUNDS NOTE :     [facility]       [place] [address]             [phone]                 13 Days Post-Op Procedure(s) (LRB): REDO CORONARY ARTERY BYPASS GRAFTING TIMES 3  USING LEFT GREATER SAPHENOUS LEG VEIN HARVESTED ENDOSCOPICALLY(CABG) (N/A) TRANSESOPHAGEAL ECHOCARDIOGRAM (TEE) (N/A) AORTIC VALVE REPLACEMENT (AVR) USING EDWARDS INTUITY 23 MM AORTIC VALVE. (N/A) Endovein Harvest Of Greater Saphenous Vein (Left)   Total Length of Stay:  LOS: 16 days  Events:  No events On CRRT Resting comfortably    BP (!) 87/58   Pulse 100   Temp 99 F (37.2 C) (Oral)   Resp (!) 43   Ht 5' 6.5" (1.689 m)   Wt 101.9 kg   SpO2 99%   BMI 35.72 kg/m      Vent Mode: BIPAP;PCV FiO2 (%):  [50 %] 50 % Set Rate:  [15 bmp] 15 bmp PEEP:  [5 cmH20] 5 cmH20  .  prismasol BGK 4/2.5 400 mL/hr at 11/09/19 1211  .  prismasol BGK 4/2.5 200 mL/hr at 11/09/19 1210  . sodium chloride Stopped (11/09/19 0700)  . amiodarone 30 mg/hr (11/09/19 1600)  . heparin 10,000 units/ 20 mL infusion syringe 500 Units/hr (11/08/19 0225)  . meropenem (MERREM) IV Stopped (11/09/19 1341)  . norepinephrine (LEVOPHED) Adult infusion 19 mcg/min (11/09/19 1600)  . prismasol BGK 4/2.5 1,800 mL/hr at 11/09/19 1209  . vancomycin Stopped (11/08/19 2131)    I/O last 3 completed shifts: In: 2669.8 [P.O.:610; I.V.:1121; IV Piggyback:938.7] Out: 3941 [Other:3941]   CBC Latest Ref Rng & Units 11/09/2019 11/08/2019 11/07/2019  WBC 4.0 - 10.5 K/uL - 14.5(H) 14.7(H)  Hemoglobin 13.0 - 17.0 g/dL 8.8(L) 8.5(L) 8.5(L)  Hematocrit 39.0 - 52.0 % 26.0(L) 27.1(L) 26.0(L)  Platelets 150 - 400 K/uL - 142(L) 118(L)    BMP Latest Ref Rng & Units 11/09/2019 11/09/2019 11/08/2019  Glucose 70 - 99 mg/dL 159(H) - 170(H)  BUN 8 - 23 mg/dL 13 - 16  Creatinine 0.61 - 1.24 mg/dL 2.46(H) - 2.79(H)  BUN/Creat Ratio 10 - 24 - - -  Sodium 135 - 145 mmol/L 134(L) 134(L) 127(L)  Potassium 3.5 - 5.1 mmol/L 4.4 4.4  4.1  Chloride 98 - 111 mmol/L 99 - 96(L)  CO2 22 - 32 mmol/L 24 - 22  Calcium 8.9 - 10.3 mg/dL 7.3(L) - 6.8(L)    ABG    Component Value Date/Time   PHART 7.450 11/09/2019 0012   PCO2ART 36.8 11/09/2019 0012   PO2ART 206.0 (H) 11/09/2019 0012   HCO3 25.7 11/09/2019 0012   TCO2 27 11/09/2019 0012   ACIDBASEDEF 4.0 (H) 10/28/2019 1825   O2SAT 61.5 11/09/2019 0945       [name], MD 11/09/2019 5:07 PM

## 2019-11-09 NOTE — Progress Notes (Signed)
Inpatient Rehabilitation Admissions Coordinator  Inpatient rehab consult received. I met with patient with his wife at bedside. We discussed goals and expectations of a possible inpt rehab admit pending his progress once he is back on intermittent hemodialysis. I will follow his progress.  Danne Baxter, RN, MSN Rehab Admissions Coordinator [phone] 11/09/2019 2:41 PM

## 2019-11-09 NOTE — Progress Notes (Signed)
Paged on call TCTS MD regarding increased work of breathing, shortness of breath, and wheezing with diminished breath sounds after repositioning. Verbal orders given. See new orders.

## 2019-11-09 NOTE — Progress Notes (Signed)
Pharmacy Antibiotic Note  Patrick [name] is a 64 y.o. male admitted on 10/24/2019 for redo bypass grafting and aortic valve replacement. Now with recurrent fever and elevated WBC. He did receive one dose of vancomycin after HD on 4/9. On 4/12, WBC elevated to 15.5, antibiotics were restarted. Pharmacy has been consulted for vancomycin and meropenem dosing. Anticipated 1 week of therapy per Dr. Orvan Seen. Patient started CRRT 4/11.   Plan: Continue vancomycin 1,250 mg IV every 24 hours.  Goal trough 15-20 mcg/mL. Meropenem 1 g IV q8h Monitor cultures, vancomycin levels, LOT, and CRRT duration  Height: 5' 6.5" (168.9 cm) Weight: 101.9 kg (224 lb 10.4 oz) IBW/kg (Calculated) : 64.95  Temp (24hrs), Avg:97.9 F (36.6 C), Min:97.6 F (36.4 C), Max:98.1 F (36.7 C)  Recent Labs  Lab 11/04/19 0312 11/04/19 0312 11/05/19 0343 11/05/19 1613 11/06/19 0318 11/06/19 1638 11/07/19 0110 11/07/19 1642 11/08/19 0059 11/08/19 0414 11/08/19 1800 11/09/19 0411  WBC 10.9*  --  10.8*  --  15.5*  --  14.7*  --   --  14.5*  --   --   CREATININE 6.60*   < > 5.70*   < > 3.42*   < > 4.98* 4.50* 4.26*  --  2.79* 2.46*   < > = values in this interval not displayed.    Estimated Creatinine Clearance: 33.8 mL/min (A) (by C-G formula based on SCr of 2.46 mg/dL (H)).    Allergies  Allergen Reactions  . Baclofen Other (See Comments)    Possibly stroke like symptoms  . Iodinated Diagnostic Agents Swelling, Rash and Other (See Comments)    Other Reaction: burning to mouth, swelling of lips  . Lipitor [Atorvastatin] Other (See Comments)    Leg pain  . Metoprolol Other (See Comments)    Headaches     Antimicrobials this admission: Vancomycin  4/9 x 1 Vancomycin 4/11 >>  Meropenem 4/11 >>  Dose adjustments this admission: None  Microbiology results: 4/11 BCx: ngtd 4/8 sputum: reincubated, normal flora 4/8 BCx: ngtd 3/31 MRSA: neg 3/29 COVID/flu: neg  Thank you for allowing pharmacy to be a  part of this patient's care.  Acey Lav, PharmD  PGY1 Acute Care Pharmacy Resident 11/09/2019       10:47 AM  Please check [email or web address] for unit-specific pharmacist phone numbers

## 2019-11-09 NOTE — Progress Notes (Signed)
Echocardiogram 2D Echocardiogram has been performed.  Patrick [name] 11/09/2019, 8:50 AM

## 2019-11-09 NOTE — Procedures (Signed)
Cortrak  Tube Type:  Cortrak - 43 inches Tube Location:  Right nare Initial Placement:  Postpyloric Secured by: Bridle Technique Used to Measure Tube Placement:  Documented cm marking at nare/ corner of mouth Cortrak Secured At:  85 cm    Cortrak Tube Team Note:  Consult received to place a Cortrak feeding tube.   X-ray is required, abdominal x-ray has been ordered by the Cortrak team. Please confirm tube placement before using the Cortrak tube.   If the tube becomes dislodged please keep the tube and contact the Cortrak team at [email or web address] (password [email or web address]) for replacement.  If after hours and replacement cannot be delayed, place a NG tube and confirm placement with an abdominal x-ray.    Koleen Distance MS, RD, LDN Please refer to [facility] for RD and/or RD on-call/weekend/after hours pager

## 2019-11-09 NOTE — Progress Notes (Signed)
Progress Note  Patient Name: Patrick [name] Date of Encounter: 11/09/2019  Primary Cardiologist: Dorris Carnes, MD   Subjective   Pt denies CP; dyspnea improving  Inpatient Medications    Scheduled Meds: . apixaban  5 mg Oral BID  . bisacodyl  10 mg Oral [name]   Or  . bisacodyl  10 mg Rectal [name]  . Chlorhexidine Gluconate Cloth  6 each Topical [name]  . clopidogrel  75 mg Oral [name]  . darbepoetin (ARANESP) injection - NON-DIALYSIS  60 mcg Subcutaneous Q Sat-1800  . docusate sodium  100 mg Oral [name]  . feeding supplement (ENSURE ENLIVE)  237 mL Oral TID BM  . insulin aspart  0-9 Units Subcutaneous TID AC & HS  . mouth rinse  15 mL Mouth Rinse BID  . midodrine  15 mg Oral TID WC  . multivitamin  1 tablet Oral QHS  . pantoprazole  40 mg Oral [name]  . rosuvastatin  10 mg Oral q1800   Continuous Infusions: .  prismasol BGK 4/2.5 400 mL/hr at 11/09/19 0011  .  prismasol BGK 4/2.5 200 mL/hr at 11/08/19 1145  . sodium chloride 10 mL/hr at 11/09/19 0700  . amiodarone 30 mg/hr (11/09/19 0700)  . heparin 10,000 units/ 20 mL infusion syringe 500 Units/hr (11/08/19 0225)  . meropenem (MERREM) IV Stopped (11/09/19 3235)  . norepinephrine (LEVOPHED) Adult infusion 17 mcg/min (11/09/19 0700)  . prismasol BGK 4/2.5 1,800 mL/hr at 11/09/19 0625  . vancomycin Stopped (11/08/19 2131)   PRN Meds: sodium chloride, acetaminophen, alteplase, dextrose, fentaNYL (SUBLIMAZE) injection, Gerhardt's butt cream, heparin, levalbuterol, magic mouthwash w/lidocaine, metoprolol tartrate, ondansetron (ZOFRAN) IV, oxyCODONE, sodium chloride, sodium chloride flush, traMADol   Vital Signs    Vitals:   11/09/19 0615 11/09/19 0645 11/09/19 0700 11/09/19 0715  BP: 129/83 102/66 (!) 88/68 112/68  Pulse: 100 100 100 100  Resp: (!) 29 (!) 35 (!) 29 (!) 23  Temp:      TempSrc:      SpO2: 100% 99% 100% 99%  Weight:      Height:        Intake/Output Summary (Last 24 hours) at 11/09/2019 0730 Last data  filed at 11/09/2019 0700 Gross per 24 hour  Intake 2063.14 ml  Output 3215 ml  Net -1151.86 ml   Last 3 Weights 11/09/2019 11/08/2019 11/07/2019  Weight (lbs) 224 lb 10.4 oz 226 lb 13.7 oz 233 lb 4 oz  Weight (kg) 101.9 kg 102.9 kg 105.8 kg      Telemetry    AV paced- Personally Reviewed   Physical Exam   GEN: NAD Neck: supple Cardiac: RRR, 1/6 systolic murmur; no DM Respiratory: Diminished BS bases GI: Soft, NT/ND, no masses MS: No edema Neuro: No focal findings Psych: Normal affect   Labs    High Sensitivity Troponin:   Recent Labs  Lab 10/24/19 0737  TROPONINIHS 2,995*      Chemistry Recent Labs  Lab 11/08/19 0059 11/08/19 0059 11/08/19 1800 11/09/19 0012 11/09/19 0411  NA 135   < > 127* 134* 134*  K 4.7   < > 4.1 4.4 4.4  CL 104  --  96*  --  99  CO2 21*  --  22  --  24  GLUCOSE 131*  --  170*  --  159*  BUN 25*  --  16  --  13  CREATININE 4.26*  --  2.79*  --  2.46*  CALCIUM 5.9*  --  6.8*  --  7.3*  ALBUMIN <1.0*  --  2.2*  --  2.1*  GFRNONAA 14*  --  23*  --  26*  GFRAA 16*  --  26*  --  31*  ANIONGAP 10  --  9  --  11   < > = values in this interval not displayed.     Hematology Recent Labs  Lab 11/06/19 0318 11/06/19 0318 11/07/19 0110 11/08/19 0414 11/09/19 0012  WBC 15.5*  --  14.7* 14.5*  --   RBC 3.32*  --  2.76* 2.76*  --   HGB 10.1*   < > 8.5* 8.5* 8.8*  HCT 32.3*   < > 26.0* 27.1* 26.0*  MCV 97.3  --  94.2 98.2  --   MCH 30.4  --  30.8 30.8  --   MCHC 31.3  --  32.7 31.4  --   RDW 20.2*  --  19.7* 20.1*  --   PLT 102*  --  118* 142*  --    < > = values in this interval not displayed.    Radiology    DG CHEST PORT 1 VIEW  Result Date: 11/08/2019 CLINICAL DATA:  Shortness of breath EXAM: PORTABLE CHEST 1 VIEW COMPARISON:  Film from earlier in the same day. FINDINGS: Cardiac shadow remains enlarged. Postsurgical changes are again seen. Tunneled right PICC line and left dialysis catheter are again seen and stable. Right  brachiocephalic stent is again identified and stable. Central vascular congestion is noted with effusion left slightly greater than right. Basilar atelectasis is again noted. No bony abnormality is seen. IMPRESSION: Tubes and lines as described above. Vascular congestion and bibasilar atelectasis. Small effusions are noted as well. Electronically Signed   By: Inez Catalina M.D.   On: 11/08/2019 23:49   DG CHEST PORT 1 VIEW  Result Date: 11/08/2019 CLINICAL DATA:  Shortness of breath.  Post open heart surgery. EXAM: PORTABLE CHEST 1 VIEW COMPARISON:  11/08/2019 earlier. FINDINGS: Left IJ central venous catheter unchanged with tip over the right atrium. Patient is slightly rotated to the left. Post median sternotomy. Right IJ central venous catheter has tip over the SVC unchanged. Lungs are somewhat hypoinflated with mild stable bibasilar opacification likely atelectasis and small amount of bilateral pleural fluid. Stable cardiomegaly. Remainder the exam is unchanged. IMPRESSION: Hypoinflation with stable bibasilar opacification likely small effusions with atelectasis. Stable cardiomegaly. Tubes and lines as described. Electronically Signed   By: Marin Olp M.D.   On: 11/08/2019 08:04    Patient Profile     64 year old gentleman with a history of coronary artery disease, end-stage renal disease on dialysis underwent coronary artery bypass and graft on April 1 with saphenous vein graft to the LAD, saphenous vein graft to the PDA obtuse marginal; aortic valve replacement with bioprosthetic aortic valve.  Postoperative echocardiogram shows ejection fraction 35 to 59%, grade 2 diastolic dysfunction, mild left atrial enlargement, aortic valve replacement with normal function.  Assessment & Plan    1 postoperative atrial fibrillation/PAT-underlying rhythm this morning is sinus rhythm with frequent PVCs.  We will continue IV amiodarone and apixaban today.  Follow on telemetry.  He remains on low-dose  norepinephrine.   2 ischemic cardiomyopathy-ejection fraction 35 to 40% on most recent echocardiogram.  However blood pressure will not allow additional cardiac medications including hydralazine/nitrates or beta-blocker.   3 postoperative volume excess-remains volume overloaded.  Dialysis per nephrology.  4 coronary artery disease status post non-ST elevation myocardial infarction-continue Plavix and statin.  5 end-stage  renal disease-dialysis per nephrology.  6 fever-continue antibiotics.  7 s/p AVR-postoperative echocardiogram shows normally functioning valve.  Remains hypotensive.  Continue midodrine.  Wean norepinephrine as tolerated. Check coox.  For questions or updates, please contact [place] Please consult [email or web address] for contact info under        Signed, Kirk Ruths, MD  11/09/2019, 7:30 AM

## 2019-11-09 NOTE — Progress Notes (Signed)
Patient ID: Patrick [name], male   DOB: October 11, 1955, 64 y.o.   MRN: [id number] S: Had a tough time breathing last night and was on BiPap.   O:BP 112/68   Pulse 100   Temp 97.6 F (36.4 C) (Axillary)   Resp (!) 23   Ht 5' 6.5" (1.689 m)   Wt 101.9 kg   SpO2 99%   BMI 35.72 kg/m   Intake/Output Summary (Last 24 hours) at 11/09/2019 0858 Last data filed at 11/09/2019 0800 Gross per 24 hour  Intake 1944.38 ml  Output 3350 ml  Net -1405.62 ml   Intake/Output: I/O last 3 completed shifts: In: 2549.8 [P.O.:490; I.V.:1121; IV Piggyback:938.7] Out: 4665 [Other:3941]  Intake/Output this shift:  Total I/O In: -  Out: 183 [Other:183] Weight change: -1 kg Gen: fatigued, increased respiratory rate and effort this am  CVS: tachy Resp: exp wheezes bilaterally Abd: +BS, soft Ext: 1+ upper extremity edema  Recent Labs  Lab 11/06/19 0318 11/06/19 0318 11/06/19 1638 11/07/19 0110 11/07/19 1642 11/08/19 0059 11/08/19 1800 11/09/19 0012 11/09/19 0411  NA 136   < > 134* 134* 133* 135 127* 134* 134*  K 4.4   < > 4.8 4.8 4.9 4.7 4.1 4.4 4.4  CL 98  --  99 99 99 104 96*  --  99  CO2 25  --  20* 17* 22 21* 22  --  24  GLUCOSE 125*  --  139* 132* 141* 131* 170*  --  159*  BUN 21  --  24* 31* 29* 25* 16  --  13  CREATININE 3.42*  --  4.00* 4.98* 4.50* 4.26* 2.79*  --  2.46*  ALBUMIN 2.4*  --  2.1* 2.2* 2.2* <1.0* 2.2*  --  2.1*  CALCIUM 7.6*  --  7.1* 6.8* 6.8* 5.9* 6.8*  --  7.3*  PHOS 1.8*  --  2.5 2.1* 2.4* 2.0* 2.7  --  1.9*   < > = values in this interval not displayed.   Liver Function Tests: Recent Labs  Lab 11/08/19 0059 11/08/19 1800 11/09/19 0411  ALBUMIN <1.0* 2.2* 2.1*   No results for input(s): LIPASE, AMYLASE in the last 168 hours. No results for input(s): AMMONIA in the last 168 hours. CBC: Recent Labs  Lab 11/04/19 0312 11/04/19 0312 11/05/19 0343 11/05/19 0343 11/06/19 0318 11/06/19 0318 11/07/19 0110 11/08/19 0414 11/09/19 0012  WBC 10.9*   < > 10.8*    < > 15.5*  --  14.7* 14.5*  --   HGB 9.6*   < > 9.1*   < > 10.1*   < > 8.5* 8.5* 8.8*  HCT 31.3*   < > 28.8*   < > 32.3*   < > 26.0* 27.1* 26.0*  MCV 99.1  --  96.3  --  97.3  --  94.2 98.2  --   PLT 64*   < > 85*   < > 102*  --  118* 142*  --    < > = values in this interval not displayed.   Cardiac Enzymes: No results for input(s): CKTOTAL, CKMB, CKMBINDEX, TROPONINI in the last 168 hours. CBG: Recent Labs  Lab 11/08/19 0637 11/08/19 1118 11/08/19 1627 11/08/19 2102 11/09/19 0613  GLUCAP 106* 128* 145* 146* 137*    Iron Studies: No results for input(s): IRON, TIBC, TRANSFERRIN, FERRITIN in the last 72 hours. Studies/Results: DG CHEST PORT 1 VIEW  Result Date: 11/08/2019 CLINICAL DATA:  Shortness of breath EXAM: PORTABLE CHEST 1 VIEW COMPARISON:  Film from earlier in the same day. FINDINGS: Cardiac shadow remains enlarged. Postsurgical changes are again seen. Tunneled right PICC line and left dialysis catheter are again seen and stable. Right brachiocephalic stent is again identified and stable. Central vascular congestion is noted with effusion left slightly greater than right. Basilar atelectasis is again noted. No bony abnormality is seen. IMPRESSION: Tubes and lines as described above. Vascular congestion and bibasilar atelectasis. Small effusions are noted as well. Electronically Signed   By: Inez Catalina M.D.   On: 11/08/2019 23:49   DG CHEST PORT 1 VIEW  Result Date: 11/08/2019 CLINICAL DATA:  Shortness of breath.  Post open heart surgery. EXAM: PORTABLE CHEST 1 VIEW COMPARISON:  11/08/2019 earlier. FINDINGS: Left IJ central venous catheter unchanged with tip over the right atrium. Patient is slightly rotated to the left. Post median sternotomy. Right IJ central venous catheter has tip over the SVC unchanged. Lungs are somewhat hypoinflated with mild stable bibasilar opacification likely atelectasis and small amount of bilateral pleural fluid. Stable cardiomegaly. Remainder the  exam is unchanged. IMPRESSION: Hypoinflation with stable bibasilar opacification likely small effusions with atelectasis. Stable cardiomegaly. Tubes and lines as described. Electronically Signed   By: Marin Olp M.D.   On: 11/08/2019 08:04   . apixaban  5 mg Oral BID  . bisacodyl  10 mg Oral Daily   Or  . bisacodyl  10 mg Rectal Daily  . Chlorhexidine Gluconate Cloth  6 each Topical Daily  . clopidogrel  75 mg Oral Daily  . darbepoetin (ARANESP) injection - NON-DIALYSIS  60 mcg Subcutaneous Q Sat-1800  . docusate sodium  100 mg Oral Daily  . feeding supplement (ENSURE ENLIVE)  237 mL Oral TID BM  . insulin aspart  0-9 Units Subcutaneous TID AC & HS  . mouth rinse  15 mL Mouth Rinse BID  . midodrine  15 mg Oral TID WC  . multivitamin  1 tablet Oral QHS  . pantoprazole  40 mg Oral Daily  . rosuvastatin  10 mg Oral q1800    BMET    Component Value Date/Time   NA 134 (L) 11/09/2019 0411   NA 139 11/30/2017 1008   K 4.4 11/09/2019 0411   CL 99 11/09/2019 0411   CO2 24 11/09/2019 0411   GLUCOSE 159 (H) 11/09/2019 0411   BUN 13 11/09/2019 0411   BUN 48 (H) 11/30/2017 1008   CREATININE 2.46 (H) 11/09/2019 0411   CALCIUM 7.3 (L) 11/09/2019 0411   GFRNONAA 26 (L) 11/09/2019 0411   GFRAA 31 (L) 11/09/2019 0411   CBC    Component Value Date/Time   WBC 14.5 (H) 11/08/2019 0414   RBC 2.76 (L) 11/08/2019 0414   HGB 8.8 (L) 11/09/2019 0012   HGB 11.9 (L) 11/30/2017 1008   HCT 26.0 (L) 11/09/2019 0012   HCT 35.3 (L) 11/30/2017 1008   PLT 142 (L) 11/08/2019 0414   PLT 197 11/30/2017 1008   MCV 98.2 11/08/2019 0414   MCV 92 11/30/2017 1008   MCH 30.8 11/08/2019 0414   MCHC 31.4 11/08/2019 0414   RDW 20.1 (H) 11/08/2019 0414   RDW 16.8 (H) 11/30/2017 1008   LYMPHSABS 1.0 10/24/2019 0737   MONOABS 0.7 10/24/2019 0737   EOSABS 0.2 10/24/2019 0737   BASOSABS 0.1 10/24/2019 0737   [facility] 4h 99.5 2K/3.5Ca P4 TDC Hep 3000 - mircera 75 last 3/22 - hect  1   Assessment/ Plan: 1. NSTEMI/CAD s/p redo CABG 12/03/07 complicated by hypotension and volume  overload. 2. Cardiogenic shock - recurrent , back on pressors 3. Resp failure - suspected pulm edema d/t vol excess. Now starting to have exp wheezes and used BiPap overnight.  Will try to increase UF rate as tolerated.  4. ESRD - usual HD MWF, sp CRRT 4/1due to ongoing issues with hypotension and volume overload. Was doing well on CVVHD until the evening of 11/06/19 when he developed a fib/RVR and VTach with hypotension. CVVHD was stopped that night and then resumed 11/07/19.  He has done well overnight but no UF.   1. Will increase UF goal to 100-150 ml/hr and hope that he doesn't have a recurrence of tachycardia and hypotension.  5. Anemia abl/ ckd - tsat 16%, ferr 1293. Due for esa on 4/5,startedAranesp 60 qwk SQ 1st dose 4/10.If continues to fall will need blood transfusion. 6. S/p AVR- good valve function per ECHO. 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  Will replete with IV sodium phos 8. Protein malnutrition- severe with low albumin.  Improved after given IV albumin 4/13/21as well to help with bp and HR with UF.   Donetta Potts, MD [employer] [phone]

## 2019-11-09 NOTE — Progress Notes (Signed)
Assessed patient's RFA PIV site. IV infusing at this time. No redness/tenderness, patient denies pain at this time. Site tissue soft and no warmth noted at this time. BLUE "puffy" in appearance. PIV infusing at this time without complications. Patient's nurse reports that PIV flushes and has great blood return. Instructed to continue to monitor and report as necessary. RN VU. Fran Lowes, RN

## 2019-11-09 NOTE — Progress Notes (Signed)
Nutrition Follow-up  DOCUMENTATION CODES:   Not applicable  INTERVENTION:   Tube Feeding: Nocturnal via Cortrak (1800 to 0600 every 24 hours) Vital 1.5 at 80 ml/hr x 12 hours daily Pro-Stat 30 mL BID Provides 1640 kcals, 95 g of protein and 730 mL Meets around 70% of estimated calorie needs, protein needs  Continue Ensure Enlive po TID, each supplement provides 350 kcal and 20 grams of protein  Continue Magic cup BID with meals, each supplement provides 290 kcal and 9 grams of protein  Continue Rena-Vit daily  Continue Regular diet as tolerated   NUTRITION DIAGNOSIS:   Inadequate oral intake related to acute illness as evidenced by NPO status.  Being addressed via TF, supplements  GOAL:   Patient will meet greater than or equal to 90% of their needs  Progressing  MONITOR:   PO intake, Diet advancement, Supplement acceptance, Labs, Weight trends, Skin  REASON FOR ASSESSMENT:   Ventilator    ASSESSMENT:   64 yo male admitted with NSTEMI and found to have mutlivessel CAD and underwent CABG on 4/1, ESRD/HD requiring CRRT. PMH includes ESRD on HD s/p renal transplant, CAD s/p CABG, CHF, DM  3/29 Admission 3/30 Cardiac Cath 4/01 CABG with IABP placement(removed 4/3), Intubated 4/02 CRRT initiated 4/04 Extubated 4/05 Cortrak placed,tip in stomach;okay to feed gastric per RD discussion with Dr. Orvan Seen 4/07 CRRT discontinued, ileus, NPO, Cortrak pulled out by pt 4/08 CL diet in AM, FL diet in PM 4/09 iHD with 1.3 L removed 4/10 CRRT re-initiated 4/11 Diet advanced to Dysphagia III 4/13 Diet advanced to Heart Healthy, Carb Modified 4/14 Cortrak placed  Recorded po intake 10% of meals yesterday; RN indicating pt ate 30-40% at breakfast this AM. Wife at bedside and encouraging po intake of meals and supplements.   Pt likes the Ensure and Magic Cup supplements. Per Wife, pt drank 100% of 1 Ensure yesterday, ate almost all of Magic Cup at [employer], bites of [employer]  at dinner.   Cortrak being placed today to supplement oral intake; plan to order  Labs: phosphorus 1.9 (L) Meds: Rena-Vit, sodium phosphate, ss novolog, colace, aranesp  Diet Order:   Diet Order            Diet regular Room service appropriate? Yes; Fluid consistency: Thin  Diet effective now              EDUCATION NEEDS:   No education needs have been identified at this time  Skin:  Skin Assessment: Skin Integrity Issues: Skin Integrity Issues:: DTI DTI: multiple toes  Last BM:  4/13  Height:   Ht Readings from Last 1 Encounters:  10/28/19 5' 6.5" (1.689 m)    Weight:   Wt Readings from Last 1 Encounters:  11/09/19 101.9 kg    BMI:  Body mass index is 35.72 kg/m.  Estimated Nutritional Needs:   Kcal:  2300-2700  Protein:  120-140 gm  Fluid:  1000 plus UOP   Kerman Passey MS, RDN, LDN, CNSC RD Pager Number and Weekend/On-Call After Hours Pager Located in [place]

## 2019-11-10 ENCOUNTER — Ambulatory Visit: Payer: Medicare Other | Admitting: Endocrinology

## 2019-11-10 ENCOUNTER — Inpatient Hospital Stay (HOSPITAL_COMMUNITY): Payer: Medicare HMO

## 2019-11-10 DIAGNOSIS — R570 Cardiogenic shock: Secondary | ICD-10-CM | POA: Diagnosis not present

## 2019-11-10 DIAGNOSIS — I214 Non-ST elevation (NSTEMI) myocardial infarction: Secondary | ICD-10-CM | POA: Diagnosis not present

## 2019-11-10 DIAGNOSIS — L8990 Pressure ulcer of unspecified site, unspecified stage: Secondary | ICD-10-CM | POA: Insufficient documentation

## 2019-11-10 DIAGNOSIS — N186 End stage renal disease: Secondary | ICD-10-CM | POA: Diagnosis not present

## 2019-11-10 LAB — RENAL FUNCTION PANEL
Albumin: 2 g/dL — ABNORMAL LOW (ref 3.5–5.0)
Albumin: 2 g/dL — ABNORMAL LOW (ref 3.5–5.0)
Anion gap: 12 (ref 5–15)
Anion gap: 10 (ref 5–15)
BUN: 13 mg/dL (ref 8–23)
BUN: 12 mg/dL (ref 8–23)
CO2: 22 mmol/L (ref 22–32)
CO2: 23 mmol/L (ref 22–32)
Calcium: 7.2 mg/dL — ABNORMAL LOW (ref 8.9–10.3)
Calcium: 7.1 mg/dL — ABNORMAL LOW (ref 8.9–10.3)
Chloride: 101 mmol/L (ref 98–111)
Chloride: 103 mmol/L (ref 98–111)
Creatinine, Ser: 2.21 mg/dL — ABNORMAL HIGH (ref 0.61–1.24)
Creatinine, Ser: 2.32 mg/dL — ABNORMAL HIGH (ref 0.61–1.24)
GFR calc Af Amer: 35 mL/min — ABNORMAL LOW (ref >60)
GFR calc Af Amer: 33 mL/min — ABNORMAL LOW (ref >60)
GFR calc non Af Amer: 30 mL/min — ABNORMAL LOW (ref >60)
GFR calc non Af Amer: 28 mL/min — ABNORMAL LOW (ref >60)
Glucose, Bld: 155 mg/dL — ABNORMAL HIGH (ref 70–99)
Glucose, Bld: 140 mg/dL — ABNORMAL HIGH (ref 70–99)
Phosphorus: 3 mg/dL (ref 2.5–4.6)
Phosphorus: 2.4 mg/dL — ABNORMAL LOW (ref 2.5–4.6)
Potassium: 4.3 mmol/L (ref 3.5–5.1)
Potassium: 4.4 mmol/L (ref 3.5–5.1)
Sodium: 135 mmol/L (ref 135–145)
Sodium: 136 mmol/L (ref 135–145)

## 2019-11-10 LAB — CBC
HCT: 25.6 % — ABNORMAL LOW (ref 39.0–52.0)
Hemoglobin: 8.1 g/dL — ABNORMAL LOW (ref 13.0–17.0)
MCH: 31.4 pg (ref 26.0–34.0)
MCHC: 31.6 g/dL (ref 30.0–36.0)
MCV: 99.2 fL (ref 80.0–100.0)
Platelets: 143 10*3/uL — ABNORMAL LOW (ref 150–400)
RBC: 2.58 MIL/uL — ABNORMAL LOW (ref 4.22–5.81)
RDW: 21.2 % — ABNORMAL HIGH (ref 11.5–15.5)
WBC: 14.1 10*3/uL — ABNORMAL HIGH (ref 4.0–10.5)
nRBC: 0.4 % — ABNORMAL HIGH (ref 0.0–0.2)

## 2019-11-10 LAB — POCT I-STAT 7, (LYTES, BLD GAS, ICA,H+H)
Acid-Base Excess: 1 mmol/L (ref 0.0–2.0)
Acid-Base Excess: 2 mmol/L (ref 0.0–2.0)
Bicarbonate: 26.1 mmol/L (ref 20.0–28.0)
Bicarbonate: 26.3 mmol/L (ref 20.0–28.0)
Calcium, Ion: 1.04 mmol/L — ABNORMAL LOW (ref 1.15–1.40)
Calcium, Ion: 1.01 mmol/L — ABNORMAL LOW (ref 1.15–1.40)
HCT: 22 % — ABNORMAL LOW (ref 39.0–52.0)
HCT: 26 % — ABNORMAL LOW (ref 39.0–52.0)
Hemoglobin: 7.5 g/dL — ABNORMAL LOW (ref 13.0–17.0)
Hemoglobin: 8.8 g/dL — ABNORMAL LOW (ref 13.0–17.0)
O2 Saturation: 48 %
O2 Saturation: 100 %
Patient temperature: 98.1
Patient temperature: 98.1
Potassium: 4.1 mmol/L (ref 3.5–5.1)
Potassium: 4.2 mmol/L (ref 3.5–5.1)
Sodium: 135 mmol/L (ref 135–145)
Sodium: 137 mmol/L (ref 135–145)
TCO2: 27 mmol/L (ref 22–32)
TCO2: 27 mmol/L (ref 22–32)
pCO2 arterial: 39.7 mmHg (ref 32.0–48.0)
pCO2 arterial: 37.1 mmHg (ref 32.0–48.0)
pH, Arterial: 7.424 (ref 7.350–7.450)
pH, Arterial: 7.457 — ABNORMAL HIGH (ref 7.350–7.450)
pO2, Arterial: 25 mmHg — CL (ref 83.0–108.0)
pO2, Arterial: 195 mmHg — ABNORMAL HIGH (ref 83.0–108.0)

## 2019-11-10 LAB — PREPARE RBC (CROSSMATCH)

## 2019-11-10 LAB — APTT: aPTT: 53 seconds — ABNORMAL HIGH (ref 24–36)

## 2019-11-10 LAB — COOXEMETRY PANEL
Carboxyhemoglobin: 1 % (ref 0.5–1.5)
Carboxyhemoglobin: 1.6 % — ABNORMAL HIGH (ref 0.5–1.5)
Methemoglobin: 0.9 % (ref 0.0–1.5)
Methemoglobin: 1.2 % (ref 0.0–1.5)
O2 Saturation: 41.3 %
O2 Saturation: 66.6 %
Total hemoglobin: 9.2 g/dL — ABNORMAL LOW (ref 12.0–16.0)
Total hemoglobin: 9.5 g/dL — ABNORMAL LOW (ref 12.0–16.0)

## 2019-11-10 LAB — POCT ACTIVATED CLOTTING TIME
Activated Clotting Time: 197 seconds
Activated Clotting Time: 197 seconds

## 2019-11-10 LAB — MAGNESIUM: Magnesium: 2.4 mg/dL (ref 1.7–2.4)

## 2019-11-10 LAB — GLUCOSE, CAPILLARY
Glucose-Capillary: 156 mg/dL — ABNORMAL HIGH (ref 70–99)
Glucose-Capillary: 102 mg/dL — ABNORMAL HIGH (ref 70–99)
Glucose-Capillary: 125 mg/dL — ABNORMAL HIGH (ref 70–99)
Glucose-Capillary: 129 mg/dL — ABNORMAL HIGH (ref 70–99)

## 2019-11-10 MED ORDER — CHLORHEXIDINE GLUCONATE 0.12 % MT SOLN
15.0000 mL | Freq: Two times a day (BID) | OROMUCOSAL | Status: DC
  Administered 2019-11-10 – 2019-11-20 (×20): 15 mL via OROMUCOSAL
  Filled 2019-11-10 (×16): qty 15

## 2019-11-10 MED ORDER — MAGIC MOUTHWASH W/LIDOCAINE
5.0000 mL | Freq: Four times a day (QID) | ORAL | Status: DC
  Administered 2019-11-10 – 2019-11-18 (×27): 5 mL via ORAL
  Filled 2019-11-10 (×35): qty 5

## 2019-11-10 MED ORDER — ORAL CARE MOUTH RINSE
15.0000 mL | Freq: Two times a day (BID) | OROMUCOSAL | Status: DC
  Administered 2019-11-11 – 2019-11-19 (×14): 15 mL via OROMUCOSAL

## 2019-11-10 MED ORDER — SODIUM CHLORIDE 0.9 % IV SOLN: INTRAVENOUS | Status: DC | PRN

## 2019-11-10 MED ORDER — SODIUM CHLORIDE 0.9% IV SOLUTION: Freq: Once | INTRAVENOUS | Status: DC

## 2019-11-10 MED ORDER — VITAL AF 1.2 CAL PO LIQD
1000.0000 mL | ORAL | Status: DC
  Administered 2019-11-10: 1000 mL

## 2019-11-10 NOTE — Progress Notes (Signed)
CRRT stopped and blood returned for ABD/pelvis CT.  Plan to restart when pt returns.  Will await results to determine restart with heparin or not.

## 2019-11-10 NOTE — Progress Notes (Signed)
PT Cancellation Note  Patient Details Name: Patrick [name] MRN: [id number] DOB: March 03, 1956   Cancelled Treatment:    Reason Eval/Treat Not Completed: Medical issues which prohibited therapy. Pt back on bipap and incr pressors.    Shary Decamp [facility] 11/10/2019, 3:47 PM [place] Pager [phone] Office [phone]

## 2019-11-10 NOTE — Procedures (Signed)
Arterial Catheter Insertion Procedure Note DONIVAN THAMMAVONG [id number] 07/04/1956  Procedure: Insertion of Arterial Catheter  Indications: Blood pressure monitoring and Frequent blood sampling  Procedure Details Consent: Risks of procedure as well as the alternatives and risks of each were explained to the (patient/caregiver).  Consent for procedure obtained. Time Out: Verified patient identification, verified procedure, site/side was marked, verified correct patient position, special equipment/implants available, medications/allergies/relevent history reviewed, required imaging and test results available.  Performed  Maximum sterile technique was used including antiseptics, cap, gloves, gown, hand hygiene, mask and sheet. Skin prep: Chlorhexidine; local anesthetic administered 20 gauge catheter was inserted into right radial artery using the Seldinger technique. ULTRASOUND GUIDANCE USED: NO Evaluation Blood flow good; BP tracing good. Complications: No apparent complications.   Vernona Rieger 11/10/2019

## 2019-11-10 NOTE — Progress Notes (Signed)
Patient taken to CT with RN and transport.  Pt tolorated CT well but then became SOB and had difficulty breathing.  RT notified and will meet [place] in room to restart pt on BiPAP.  Will restart CRRT with heparin and ACTs per protocol.

## 2019-11-10 NOTE — Progress Notes (Addendum)
Progress Note  Patient Name: Patrick [name] Date of Encounter: 11/10/2019  Primary Cardiologist: Dorris Carnes, MD   Subjective   No CP; mild dyspnea; complains of abdominal pain, diarrhea and nausea  Inpatient Medications    Scheduled Meds: . apixaban  5 mg Oral BID  . bisacodyl  10 mg Oral [name]   Or  . bisacodyl  10 mg Rectal [name]  . Chlorhexidine Gluconate Cloth  6 each Topical [name]  . clopidogrel  75 mg Oral [name]  . darbepoetin (ARANESP) injection - NON-DIALYSIS  60 mcg Subcutaneous Q Sat-1800  . docusate sodium  100 mg Oral [name]  . feeding supplement (ENSURE ENLIVE)  237 mL Oral TID BM  . feeding supplement (PRO-STAT SUGAR FREE 64)  30 mL Oral BID  . feeding supplement (VITAL 1.5 CAL)  1,000 mL Per Tube Q24H  . insulin aspart  0-9 Units Subcutaneous TID AC & HS  . mouth rinse  15 mL Mouth Rinse BID  . midodrine  15 mg Oral TID WC  . multivitamin  1 tablet Oral QHS  . pantoprazole  40 mg Oral [name]  . rosuvastatin  10 mg Oral q1800   Continuous Infusions: .  prismasol BGK 4/2.5 400 mL/hr at 11/10/19 0112  .  prismasol BGK 4/2.5 200 mL/hr at 11/09/19 1210  . sodium chloride Stopped (11/10/19 0316)  . amiodarone 30 mg/hr (11/10/19 0700)  . heparin 10,000 units/ 20 mL infusion syringe 500 Units/hr (11/09/19 1900)  . meropenem (MERREM) IV Stopped (11/10/19 0215)  . norepinephrine (LEVOPHED) Adult infusion 20 mcg/min (11/10/19 0700)  . prismasol BGK 4/2.5 1,800 mL/hr at 11/10/19 0540  . vancomycin Stopped (11/09/19 1850)   PRN Meds: sodium chloride, acetaminophen, alteplase, dextrose, fentaNYL (SUBLIMAZE) injection, Gerhardt's butt cream, heparin, levalbuterol, magic mouthwash w/lidocaine, metoprolol tartrate, ondansetron (ZOFRAN) IV, oxyCODONE, sodium chloride, sodium chloride flush, traMADol   Vital Signs    Vitals:   11/10/19 0615 11/10/19 0630 11/10/19 0645 11/10/19 0700  BP:  (!) 74/52 (!) 81/61 115/89  Pulse: 100 100 100 100  Resp: (!) 30 (!) 35 (!) 29  (!) 30  Temp:      TempSrc:      SpO2: 100% 100% 97% 100%  Weight:      Height:        Intake/Output Summary (Last 24 hours) at 11/10/2019 0731 Last data filed at 11/10/2019 0700 Gross per 24 hour  Intake 2821.9 ml  Output 4558 ml  Net -1736.1 ml   Last 3 Weights 11/10/2019 11/09/2019 11/08/2019  Weight (lbs) 222 lb 7.1 oz 224 lb 10.4 oz 226 lb 13.7 oz  Weight (kg) 100.9 kg 101.9 kg 102.9 kg      Telemetry    Sinus with PVCs- Personally Reviewed   Physical Exam   GEN: Mildly dyspneic Neck: supple, JVP difficult to assess Cardiac: RRR Respiratory: Diminished BS bases; no expiratory wheeze GI: Tender, palpable mass RLQ MS: No edema Neuro: Grossly intact Psych: Normal affect   Labs    High Sensitivity Troponin:   Recent Labs  Lab 10/24/19 0737  TROPONINIHS 2,995*      Chemistry Recent Labs  Lab 11/09/19 0411 11/09/19 1650 11/10/19 0347  NA 134* 134* 135  K 4.4 4.8 4.3  CL 99 100 101  CO2 24 23 22   GLUCOSE 159* 260* 155*  BUN 13 12 13   CREATININE 2.46* 2.21* 2.21*  CALCIUM 7.3* 7.0* 7.2*  ALBUMIN 2.1* 2.0* 2.0*  GFRNONAA 26* 30* 30*  GFRAA 31* 35*  35*  ANIONGAP 11 11 12      Hematology Recent Labs  Lab 11/06/19 0318 11/06/19 0318 11/07/19 0110 11/08/19 0414 11/09/19 0012  WBC 15.5*  --  14.7* 14.5*  --   RBC 3.32*  --  2.76* 2.76*  --   HGB 10.1*   < > 8.5* 8.5* 8.8*  HCT 32.3*   < > 26.0* 27.1* 26.0*  MCV 97.3  --  94.2 98.2  --   MCH 30.4  --  30.8 30.8  --   MCHC 31.3  --  32.7 31.4  --   RDW 20.2*  --  19.7* 20.1*  --   PLT 102*  --  118* 142*  --    < > = values in this interval not displayed.    Radiology    DG CHEST PORT 1 VIEW  Result Date: 11/08/2019 CLINICAL DATA:  Shortness of breath EXAM: PORTABLE CHEST 1 VIEW COMPARISON:  Film from earlier in the same day. FINDINGS: Cardiac shadow remains enlarged. Postsurgical changes are again seen. Tunneled right PICC line and left dialysis catheter are again seen and stable. Right  brachiocephalic stent is again identified and stable. Central vascular congestion is noted with effusion left slightly greater than right. Basilar atelectasis is again noted. No bony abnormality is seen. IMPRESSION: Tubes and lines as described above. Vascular congestion and bibasilar atelectasis. Small effusions are noted as well. Electronically Signed   By: Inez Catalina M.D.   On: 11/08/2019 23:49   DG Abd Portable 1V  Result Date: 11/09/2019 CLINICAL DATA:  Feeding tube placement. EXAM: PORTABLE ABDOMEN - 1 VIEW COMPARISON:  November 01, 2019. FINDINGS: Distal tip of feeding tube is seen in expected position of distal stomach. Mildly dilated small bowel loops are noted. IMPRESSION: Distal tip of feeding tube seen in expected position of distal stomach. Electronically Signed   By: Marijo Conception M.D.   On: 11/09/2019 12:27   ECHOCARDIOGRAM LIMITED  Result Date: 11/09/2019    ECHOCARDIOGRAM LIMITED REPORT   Patient Name:   Patrick [name] Date of Exam: 11/09/2019 Medical Rec #:  [id number]       Height:       66.5 in Accession #:    [id number]      Weight:       224.6 lb Date of Birth:  Jun 09, 1956        BSA:          2.113 m Patient Age:    64 years        BP:           110/65 mmHg Patient Gender: M               HR:           102 bpm. Exam Location:  Inpatient Procedure: Limited Echo, Color Doppler, Cardiac Doppler and Intracardiac            Opacification Agent Indications:    B71.69 Acute diastolic (congestive) heart failure  History:        Patient has prior history of Echocardiogram examinations, most                 recent 11/01/2019. CHF, Prior CABG, PAD, Arrythmias:Atrial                 Fibrillation and Atrial Flutter; Risk Factors:Diabetes and                 Dyslipidemia. 10/27/19 CABG and Aortic Valve  Replacement with 21mm                 [employer].  Sonographer:    Raquel Sarna Senior RDCS Referring Phys: [id number] [place]  1. Left ventricular ejection fraction, by estimation, is  30 to 35%. The left ventricle has moderately decreased function. The left ventricle demonstrates regional wall motion abnormalities (see scoring diagram/findings for description). Left ventricular  diastolic parameters are consistent with Grade I diastolic dysfunction (impaired relaxation). There is severe akinesis of the left ventricular, apical anteroseptal wall and inferoseptal wall.  2. The mitral valve is grossly normal. Mild mitral valve regurgitation.  3. The aortic valve has been repaired/replaced. FINDINGS  Left Ventricle: Left ventricular ejection fraction, by estimation, is 30 to 35%. The left ventricle has moderately decreased function. The left ventricle demonstrates regional wall motion abnormalities. Severe akinesis of the left ventricular, apical anteroseptal wall and inferoseptal wall. Mitral Valve: The mitral valve is grossly normal. There is mild thickening of the mitral valve leaflet(s). There is mild calcification of the mitral valve leaflet(s). Mild mitral valve regurgitation. Aortic Valve: The aortic valve has been repaired/replaced. There is a bioprosthetic valve present in the aortic position. Pulmonic Valve: The pulmonic valve was grossly normal. Pulmonic valve regurgitation is not visualized. No evidence of pulmonic stenosis. Mertie Moores MD Electronically signed by Mertie Moores MD Signature Date/Time: 11/09/2019/10:39:28 AM    Final     Patient Profile     64 year old gentleman with a history of coronary artery disease, end-stage renal disease on dialysis underwent coronary artery bypass and graft on April 1 with saphenous vein graft to the LAD, saphenous vein graft to the PDA obtuse marginal; aortic valve replacement with bioprosthetic aortic valve.  Postoperative echocardiogram shows ejection fraction 35 to 57%, grade 2 diastolic dysfunction, mild left atrial enlargement, aortic valve replacement with normal function.  Assessment & Plan    1 postoperative atrial  fibrillation/PAT-underlying rhythm this morning is sinus rhythm with rare PVC.  We will continue IV amiodarone. Follow on telemetry.  He remains on low-dose norepinephrine.   2 abdominal pain-palpable mass right lower quadrant and tenderness.  Question ileus or hematoma.  Will hold apixaban.  Check hgb. Will likely need CT.  3 ischemic cardiomyopathy-ejection fraction 30-35% on most recent echocardiogram.  However blood pressure will not allow additional cardiac medications including hydralazine/nitrates or beta-blocker.   3 postoperative volume excess-remains volume overloaded.  Dialysis per nephrology.  4 coronary artery disease status post non-ST elevation myocardial infarction-continue Plavix and statin.  5 end-stage renal disease-dialysis per nephrology.  6 fever-continue antibiotics.  7 s/p AVR-postoperative echocardiogram shows normally functioning valve.  Remains hypotensive.  Continue midodrine.  Wean norepinephrine as tolerated. Coox yesterday 61. Will recheck.  For questions or updates, please contact [place] Please consult [email or web address] for contact info under   Signed, Kirk Ruths, MD  11/10/2019, 7:31 AM    Coox 41; will ask advance heart failure to review; likely will need milrinone.  Kirk Ruths

## 2019-11-10 NOTE — Progress Notes (Signed)
Patient came back from CT with low SPO2 and slight labored respirations. Placed on BIPAP. SPO2 96% and patient is resting comfortably. RT will continue to monitor.

## 2019-11-10 NOTE — Progress Notes (Signed)
Paged TCTS regarding 9/10 sharp abdominal pain despite any intervention. Verbal order to turn off tube feeds.

## 2019-11-10 NOTE — Progress Notes (Signed)
      [address]       [place] [address]             [phone]      POD 12 Redo CABG, AVR  On BIPAP 50%  BP 127/80   Pulse 100   Temp 98.8 F (37.1 C) (Axillary)   Resp (!) 30   Ht 5' 6.5" (1.689 m)   Wt 100.9 kg   SpO2 100%   BMI 35.37 kg/m  Norepi at 26 mcg/min Co-ox = 66  Intake/Output Summary (Last 24 hours) at 11/10/2019 1707 Last data filed at 11/10/2019 1600 Gross per 24 hour  Intake 2579.99 ml  Output 3135 ml  Net -555.01 ml   Continue current care  Deliyah Muckle C. Roxan Hockey, MD [facility] [phone]

## 2019-11-10 NOTE — Progress Notes (Signed)
Notified Dr. Orvan Seen re Coox down to 41 and HGB 8.1.  Orders received for 1 unit PRBCs and abdominal CT.

## 2019-11-10 NOTE — Consult Note (Addendum)
Advanced Heart Failure Team Consult Note   Primary Physician: Patrick Side., FNP PCP-Cardiologist:  Patrick Carnes, [name]  Reason for Consultation: Heart Failure  HPI:    Patrick [name] is seen today for evaluation of heart failure at the request of Dr Patrick [name].    Patrick [name] is a 64 year old with a history of  coronary artery disease, CABG 27 [place] to LAD, failed kidney transplant, end-stage renal disease on dialysis, admitted with chest pain.   Had Athena with severe CAD  underwent redo coronary artery bypass 10/27/19 with saphenous vein graft to the LAD, saphenous vein graft to the PDA obtuse marginal; aortic valve replacement with bioprosthetic aortic valve.  IABP removed 4/3. Extubated on 10/30/19. Postoperative echocardiogram shows ejection fraction 35 to 34%, grade 2 diastolic dysfunction, mild left atrial enlargement, aortic valve replacement with normal function.  Post operative course has been complicated by cardiogenic shock, respiratory failure, ESRD, VT, and protein malnutrition. ECHO completed yesterday and showed EF was 30-35%.   Back on Bipap last night. Today he was complaining of severe abdominal pain. Had CT of abdomen today that was negative for hematoma and possible ileus. Developed respiratory distress and placed back bipap this morning.    Currently on Norepi 26 mcg + amio drip. CO-OX 41%.   Blood CX - negative Sputum - negative   Review of Systems: [y] = yes, [ ]  = no On Bipap   . General: Weight gain [ ] ; Weight loss [ ] ; Anorexia [ ] ; Fatigue [ ] ; Fever [ ] ; Chills [ ] ; Weakness [ ]   . Cardiac: Chest pain/pressure [ ] ; Resting SOB [ ] ; Exertional SOB [ Y]; Orthopnea [ Y]; Pedal Edema [ ] ; Palpitations [ ] ; Syncope [ ] ; Presyncope [ ] ; Paroxysmal nocturnal dyspnea[ ]   . Pulmonary: Cough [ ] ; Wheezing[ ] ; Hemoptysis[ ] ; Sputum [ ] ; Snoring [ ]   . GI: Vomiting[ ] ; Dysphagia[ ] ; Melena[ ] ; Hematochezia [ ] ; Heartburn[ ] ; Abdominal pain [Y ]; Constipation [ ] ;  Diarrhea [ ] ; BRBPR [ ]   . GU: Hematuria[ ] ; Dysuria [ ] ; Nocturia[ ]   . Vascular: Pain in legs with walking [ ] ; Pain in feet with lying flat [ ] ; Non-healing sores [ ] ; Stroke [ ] ; TIA [ ] ; Slurred speech [ ] ;  . Neuro: Headaches[ ] ; Vertigo[ ] ; Seizures[ ] ; Paresthesias[ ] ;Blurred vision [ ] ; Diplopia [ ] ; Vision changes [ ]   . Ortho/Skin: Arthritis [ ] ; Joint pain [Y ]; Muscle pain [ ] ; Joint swelling [ ] ; Back Pain [ ] ; Rash [ ]   . Psych: Depression[ ] ; Anxiety[ ]   . Heme: Bleeding problems [ ] ; Clotting disorders [ ] ; Anemia [Y ]  . Endocrine: Diabetes [ ] ; Thyroid dysfunction[ ]   Home Medications Prior to Admission medications   Medication Sig Start Date End Date Taking? Authorizing Provider  acetaminophen (TYLENOL) 500 MG tablet Take 1,000 mg by mouth every 6 (six) hours as needed for moderate pain or headache.   Yes Patrick [name]  albuterol (PROVENTIL HFA;VENTOLIN HFA) 108 (90 Base) MCG/ACT inhaler Inhale 1-2 puffs into the lungs every 6 (six) hours as needed for wheezing or shortness of breath. 12/18/17  Yes Patrick [name]  aspirin EC 81 MG tablet Take 81 mg by mouth [name].    Yes Patrick [name]  calcium acetate (PHOSLO) 667 MG capsule Take 1,334-2,001 mg by mouth See admin instructions. 3 capsules with dinner and 2 capsules with snacks   Yes Provider,  Historical, [name]  calcium carbonate (OS-CAL - DOSED IN MG OF ELEMENTAL CALCIUM) 1250 (500 Ca) MG tablet Take 2 tablets by mouth [name].  11/29/13  Yes Patrick [name]  clopidogrel (PLAVIX) 75 MG tablet TAKE 1 TABLET(75 MG) BY MOUTH [name] Patient taking differently: Take 75 mg by mouth [name].  03/03/19  Yes Patrick [name]  Colchicine 0.6 MG CAPS Take 0.6 mg by mouth See admin instructions. Take 0.6 mg when first symptoms of gout flare occur, then take another 0.6 mg 14 days later as needed for gout 10/13/18  Yes Patrick [name]  dextromethorphan (DELSYM) 30 MG/5ML liquid Take 30 mg by mouth as  needed for cough.   Yes Patrick [name]  Dextromethorphan-guaiFENesin 5-100 MG/5ML LIQD Take 15 mLs by mouth 2 (two) times [name] as needed (Cough).  06/03/18  Yes Patrick [name]  docusate sodium (COLACE) 100 MG capsule Take 100 mg by mouth [name] as needed for mild constipation.   Yes Patrick [name]  ezetimibe (ZETIA) 10 MG tablet TAKE 1 TABLET(10 MG) BY MOUTH [name] Patient taking differently: Take 10 mg by mouth [name].  05/09/19  Yes Patrick [name]  isosorbide mononitrate (IMDUR) 30 MG 24 hr tablet Take 1 tablet (30 mg total) by mouth [name]. 02/24/19  Yes Patrick [name]  lidocaine-prilocaine (EMLA) cream Apply 1 application topically [name] as needed (Numbing).  03/21/19  Yes Patrick [name]  metoprolol succinate (TOPROL XL) 25 MG 24 hr tablet Take 1 tablet (25 mg total) by mouth [name]. 05/05/19  Yes Patrick [name]  multivitamin (RENA-VIT) TABS tablet Take 1 tablet by mouth [name].    Yes Patrick [name]  neomycin-bacitracin-polymyxin (NEOSPORIN) ointment Apply 1 application topically 2 (two) times [name]. Apply to feet.   Yes Patrick [name]  nitroGLYCERIN (NITROSTAT) 0.4 MG SL tablet Place 1 tablet (0.4 mg total) under the tongue every 5 (five) minutes as needed for chest pain. 09/16/19 12/15/19 Yes Patrick [name]  rosuvastatin (CRESTOR) 10 MG tablet Take 1 tablet (10 mg total) by mouth [name]. 02/24/19  Yes Patrick [name]  silver sulfADIAZINE (SILVADENE) 1 % cream Apply 1 application topically [name]. Patient taking differently: Apply 1 application topically 2 (two) times [name].  10/18/19  Yes Patrick [name], DPM  traMADol (ULTRAM) 50 MG tablet Take 50 mg by mouth 2 (two) times [name] as needed for moderate pain.  05/19/18  Yes Patrick [name]  doxycycline (VIBRA-TABS) 100 MG tablet Take 1 tablet (100 mg total) by mouth 2 (two) times [name]. Patient not taking: Reported on 10/24/2019 09/06/19   Patrick [name], DPM    Methoxy PEG-Epoetin Beta (MIRCERA IJ) Mircera 09/05/19 09/03/20  Patrick [name]  oxyCODONE-acetaminophen (PERCOCET/ROXICET) 5-325 MG tablet Take 1 tablet by mouth every 6 (six) hours as needed. Patient not taking: Reported on 10/24/2019 12/09/18   Ulyses Amor, [name]  pantoprazole (PROTONIX) 40 MG tablet Take 1 tablet (40 mg total) by mouth [name]. Patient not taking: Reported on 10/24/2019 11/08/18   Leanor Kail, PA  repaglinide (PRANDIN) 0.5 MG tablet Take 1 tablet (0.5 mg total) by mouth 2 (two) times [name] before a meal. OVERDUE FOR AN APPT. WILL PROVIDE 30 DAY SUPPLY ONLY. MUST CALL OFFICE 11/03/19   Renato Shin, [name]    Past Medical History: Past Medical History:  Diagnosis Date  . Arthritis   . Atrial flutter ([place])    Ablation 2018  . 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 Erectile dysfunction   . ESRD (end stage renal disease) on dialysis ([place])    Pt on HD 2003 >> transplanted in 2009, back on HD in 2016. [name] TTS.   . Essential hypertension   . Gout   . Hernia of abdominal wall   . History of blood transfusion   . History of cardiomyopathy   . History of kidney stones   . History of pneumonia   . Migraine   . Myocardial infarction ([place])    1996  . Pneumonia   . Secondary hyperparathyroidism ([place])   . Type 2 diabetes mellitus ([place])   . Wears glasses     Past Surgical History: Past Surgical History:  Procedure Laterality Date  . A-FLUTTER ABLATION N/A 09/24/2016   Procedure: A-Flutter Ablation;  Surgeon: Will Meredith Leeds, [name];  Location: [facility];  Service: Cardiovascular;  Laterality: N/A;  . ABDOMINAL AORTOGRAM W/LOWER EXTREMITY N/A 04/13/2017   Procedure: ABDOMINAL AORTOGRAM W/LOWER EXTREMITY;  Surgeon: Conrad [place], [name];  Location: [facility];  Service:  Cardiovascular;  Laterality: N/A;  Bilater lower extermity  . AORTIC VALVE REPLACEMENT N/A 10/27/2019   Procedure: AORTIC VALVE REPLACEMENT (AVR) USING EDWARDS INTUITY 23 MM AORTIC VALVE.;  Surgeon: Wonda Olds, [name];  Location: [facility];  Service: Open Heart Surgery;  Laterality: N/A;  . APPENDECTOMY    . AV FISTULA PLACEMENT Right 09/18/2014   Procedure: INSERTION OF ARTERIOVENOUS (AV) GORE-TEX GRAFT ARM USING 4-7MM  X 45CM STRETCH GORE-TEX VASCULAR GRAFT;  Surgeon: Rosetta Posner, [name];  Location: [facility];  Service: Vascular;  Laterality: Right;  . AV FISTULA PLACEMENT Left 07/07/2016   Procedure: INSERTION OF LEFT BRACHIAL TO AXILLARY ARTERIOVENOUS (AV) GORE-TEX ARM GRAFT;  Surgeon: Conrad [place], [name];  Location: [facility];  Service: Vascular;  Laterality: Left;  . AV FISTULA PLACEMENT Left 12/09/2018   Procedure: Insertion Of Arteriovenous (Av) Gore-Tex Graft Arm, left arm;  Surgeon: Marty Heck, [name];  Location: [facility];  Service: Vascular;  Laterality: Left;  . CARDIAC CATHETERIZATION  ~ 2016  . COLONOSCOPY    . 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 CORONARY ARTERY BYPASS GRAFT N/A 10/27/2019   Procedure: REDO CORONARY ARTERY BYPASS GRAFTING TIMES 3  USING LEFT GREATER SAPHENOUS LEG VEIN HARVESTED ENDOSCOPICALLY(CABG);  Surgeon: Wonda Olds, [name];  Location: [facility];  Service: Open Heart Surgery;  Laterality: N/A;  . CORONARY ATHERECTOMY N/A 02/16/2019   Procedure: CORONARY ATHERECTOMY;  Surgeon: Martinique, Peter M, [name];  Location: [facility];  Service: Cardiovascular;  Laterality: N/A;  . ENDOVEIN HARVEST OF GREATER SAPHENOUS VEIN Left 10/27/2019   Procedure: [place] Of Greater Saphenous Vein;  Surgeon: Wonda Olds, [name];  Location: [facility];  Service: Open Heart Surgery;  Laterality: Left;  . EXCHANGE OF A DIALYSIS CATHETER Left 01/11/2018   Procedure: EXCHANGE OF TUNNELED DIALYSIS CATHETER;  Surgeon: Rosetta Posner, [name];  Location: [facility];  Service: Vascular;  Laterality: Left;  . HERNIA REPAIR  2017   with nephrectomy  . INSERTION OF DIALYSIS CATHETER N/A 10/08/2017   Procedure: INSERTION OF TUNNELED DIALYSIS CATHETER;  Surgeon: Conrad [place], [name];  Location: [facility];  Service: Vascular;  Laterality: N/A;  . IR FLUORO GUIDE  CV LINE LEFT  03/12/2018  . IR FLUORO GUIDE CV LINE RIGHT  10/31/2019  . IR GENERIC HISTORICAL  05/11/2016   IR FLUORO GUIDE CV LINE LEFT 05/11/2016 Corrie Mckusick, DO [facility]  . IR GENERIC HISTORICAL  05/11/2016   IR US GUIDE VASC ACCESS LEFT 05/11/2016 Corrie Mckusick, DO [facility]  . IR GENERIC HISTORICAL  05/11/2016   IR US GUIDE VASC ACCESS RIGHT 05/11/2016 Corrie Mckusick, DO [facility]  . IR GENERIC HISTORICAL  05/11/2016   IR RADIOLOGY PERIPHERAL GUIDED IV START 05/11/2016 Corrie Mckusick, DO [facility]  . IR PTA VENOUS EXCEPT DIALYSIS CIRCUIT  10/31/2019  . IR US GUIDE VASC ACCESS RIGHT  10/31/2019  . KIDNEY TRANSPLANT  2009  . LEFT HEART CATH AND CORONARY ANGIOGRAPHY N/A 02/15/2019   Procedure: LEFT HEART CATH AND CORONARY ANGIOGRAPHY;  Surgeon: Martinique, Peter M, [name];  Location: [facility];  Service: Cardiovascular;  Laterality: N/A;  . LEFT HEART CATH AND CORS/GRAFTS ANGIOGRAPHY N/A 12/04/2017   Procedure: LEFT HEART CATH AND CORS/GRAFTS ANGIOGRAPHY;  Surgeon: Belva Crome, [name];  Location: [facility];  Service: Cardiovascular;  Laterality: N/A;  . LEFT HEART CATH AND CORS/GRAFTS ANGIOGRAPHY N/A 10/25/2019   Procedure: LEFT HEART CATH AND CORS/GRAFTS ANGIOGRAPHY;  Surgeon: Martinique, Peter M, [name];  Location: [facility];  Service: Cardiovascular;  Laterality: N/A;  . NEPHRECTOMY  2017   transplant rejected   . PERIPHERAL VASCULAR CATHETERIZATION N/A 06/04/2016   Procedure: Upper Extremity Venography;  Surgeon: Waynetta Sandy, [name];  Location: [facility];  Service: Cardiovascular;  Laterality: N/A;  . PERIPHERAL VASCULAR INTERVENTION   04/13/2017   Procedure: PERIPHERAL VASCULAR INTERVENTION;  Surgeon: Conrad [place], [name];  Location: [facility];  Service: Cardiovascular;;  Lt. Common/Exernal  Iliac  . TEE WITHOUT CARDIOVERSION N/A 10/27/2019   Procedure: TRANSESOPHAGEAL ECHOCARDIOGRAM (TEE);  Surgeon: Wonda Olds, [name];  Location: [facility];  Service: Open Heart Surgery;  Laterality: N/A;  . THROMBECTOMY AND REVISION OF ARTERIOVENTOUS (AV) GORETEX  GRAFT Left 10/08/2017   Procedure: THROMBECTOMY of ARTERIOVENTOUS (AV) GORETEX  GRAFT LEFT UPPER ARM;  Surgeon: Conrad [place], [name];  Location: [facility];  Service: Vascular;  Laterality: Left;  . THROMBECTOMY W/ EMBOLECTOMY Left 09/14/2017   Procedure: THROMBECTOMY ARTERIOVENOUS GORE-TEX GRAFT LEFT UPPER ARM;  Surgeon: Rosetta Posner, [name];  Location: [facility];  Service: Vascular;  Laterality: Left;  . UPPER EXTREMITY ANGIOGRAPHY Bilateral 10/29/2018   Procedure: UPPER EXTREMITY ANGIOGRAPHY;  Surgeon: Marty Heck, [name];  Location: [facility];  Service: Cardiovascular;  Laterality: Bilateral;  . UPPER EXTREMITY VENOGRAPHY N/A 11/16/2017   Procedure: UPPER EXTREMITY VENOGRAPHY - Right Arm;  Surgeon: Conrad , [name];  Location: [facility];  Service: Cardiovascular;  Laterality: N/A;  . UPPER EXTREMITY VENOGRAPHY Bilateral 10/29/2018   Procedure: UPPER EXTREMITY VENOGRAPHY;  Surgeon: Marty Heck, [name];  Location: [facility];  Service: Cardiovascular;  Laterality: Bilateral;    Family History: Family History  Problem Relation Age of Onset  . Hyperlipidemia Mother   . Hypertension Mother   . HIV Sister     Social History: Social History   Socioeconomic History  . Marital status: Married    Spouse name: SHEILA  . Number of children: 5  . Years of education: 58  . Highest education level: 12th grade  Occupational History  . Occupation: DISABLED  Tobacco Use  . Smoking status: Former Smoker  Types: Cigarettes    Quit date: 07/04/2014    Years since  quitting: 5.3  . Smokeless tobacco: Never Used  . Tobacco comment: "smoked ~ 1 pack/month when I did smoke; never a steady smoker"  Substance and Sexual Activity  . Alcohol use: Yes    Alcohol/week: 0.0 standard drinks    Comment: rare  "2 drinks, 1-2 times/year"  . Drug use: No  . Sexual activity: Not Currently    Birth control/protection: None  Other Topics Concern  . Not on file  Social History Narrative  . Not on file   Social Determinants of Health   Financial Resource Strain:   . Difficulty of Paying Living Expenses:   Food Insecurity:   . Worried About Charity fundraiser in the Last Year:   . Arboriculturist in the Last Year:   Transportation Needs:   . Film/video editor (Medical):   Marland Kitchen Lack of Transportation (Non-Medical):   Physical Activity:   . Days of Exercise per Week:   . Minutes of Exercise per Session:   Stress:   . Feeling of Stress :   Social Connections:   . Frequency of Communication with Friends and Family:   . Frequency of Social Gatherings with Friends and Family:   . Attends Religious Services:   . Active Member of Clubs or Organizations:   . Attends Archivist Meetings:   Marland Kitchen Marital Status:     Allergies:  Allergies  Allergen Reactions  . Baclofen Other (See Comments)    Possibly stroke like symptoms  . Iodinated Diagnostic Agents Swelling, Rash and Other (See Comments)    Other Reaction: burning to mouth, swelling of lips  . Lipitor [Atorvastatin] Other (See Comments)    Leg pain  . Metoprolol Other (See Comments)    Headaches     Objective:    Vital Signs:   Temp:  [96.4 F (35.8 C)-99.5 F (37.5 C)] 99.5 F (37.5 C) (04/15 1330) Pulse Rate:  [68-172] 100 (04/15 1330) Resp:  [12-52] 27 (04/15 1330) BP: (67-160)/(31-140) 90/34 (04/15 1300) SpO2:  [74 %-100 %] 94 % (04/15 1330) FiO2 (%):  [50 %] 50 % (04/15 1044) Weight:  [100.9 kg] 100.9 kg (04/15 0500) Last BM Date: 11/09/19  Weight change: Filed Weights    11/08/19 0500 11/09/19 0500 11/10/19 0500  Weight: 102.9 kg 101.9 kg 100.9 kg    Intake/Output:   Intake/Output Summary (Last 24 hours) at 11/10/2019 1354 Last data filed at 11/10/2019 1300 Gross per 24 hour  Intake 2673.22 ml  Output 3744 ml  Net -1070.78 ml      Physical Exam    General:  On Bipap  HEENT: normal Neck: supple. JVP . Carotids 2+ bilat; no bruits. No lymphadenopathy or thyromegaly appreciated. Cor: PMI nondisplaced. Regular rate & rhythm. No rubs, gallops or murmurs. L HD catheter R tunneled cath.  Lungs: clear Abdomen: soft, nontender, nondistended. No hepatosplenomegaly. No bruits or masses. Good bowel sounds. Extremities: no cyanosis, clubbing, rash, edema.  Neuro: alert & orientedx3, cranial nerves grossly intact. moves all 4 extremities w/o difficulty. Affect flat    Telemetry   A paced 100 bpm  EKG    N/a   Labs   Basic Metabolic Panel: Recent Labs  Lab 11/06/19 0318 11/06/19 1638 11/07/19 0110 11/07/19 1642 11/08/19 0059 11/08/19 0059 11/08/19 1800 11/09/19 0012 11/09/19 0411 11/09/19 1650 11/10/19 0347 11/10/19 1226 11/10/19 1234  NA 136   < > 134*   < >  135   < > 127*   < > 134* 134* 135 135 137  K 4.4   < > 4.8   < > 4.7   < > 4.1   < > 4.4 4.8 4.3 4.1 4.2  CL 98   < > 99   < > 104  --  96*  --  99 100 101  --   --   CO2 25   < > 17*   < > 21*  --  22  --  24 23 22   --   --   GLUCOSE 125*   < > 132*   < > 131*  --  170*  --  159* 260* 155*  --   --   BUN 21   < > 31*   < > 25*  --  16  --  13 12 13   --   --   CREATININE 3.42*   < > 4.98*   < > 4.26*  --  2.79*  --  2.46* 2.21* 2.21*  --   --   CALCIUM 7.6*   < > 6.8*   < > 5.9*   < > 6.8*  --  7.3* 7.0* 7.2*  --   --   MG 2.3  --  2.2  --  2.1  --   --   --  2.5*  --  2.4  --   --   PHOS 1.8*   < > 2.1*   < > 2.0*  --  2.7  --  1.9* 1.6* 3.0  --   --    < > = values in this interval not displayed.    Liver Function Tests: Recent Labs  Lab 11/08/19 0059 11/08/19 1800  11/09/19 0411 11/09/19 1650 11/10/19 0347  ALBUMIN <1.0* 2.2* 2.1* 2.0* 2.0*   No results for input(s): LIPASE, AMYLASE in the last 168 hours. No results for input(s): AMMONIA in the last 168 hours.  CBC: Recent Labs  Lab 11/05/19 0343 11/05/19 0343 11/06/19 0318 11/06/19 0318 11/07/19 0110 11/07/19 0110 11/08/19 0414 11/09/19 0012 11/10/19 0740 11/10/19 1226 11/10/19 1234  WBC 10.8*  --  15.5*  --  14.7*  --  14.5*  --  14.1*  --   --   HGB 9.1*   < > 10.1*   < > 8.5*   < > 8.5* 8.8* 8.1* 7.5* 8.8*  HCT 28.8*   < > 32.3*   < > 26.0*   < > 27.1* 26.0* 25.6* 22.0* 26.0*  MCV 96.3  --  97.3  --  94.2  --  98.2  --  99.2  --   --   PLT 85*  --  102*  --  118*  --  142*  --  143*  --   --    < > = values in this interval not displayed.    Cardiac Enzymes: No results for input(s): CKTOTAL, CKMB, CKMBINDEX, TROPONINI in the last 168 hours.  BNP: BNP (last 3 results) No results for input(s): BNP in the last 8760 hours.  ProBNP (last 3 results) No results for input(s): PROBNP in the last 8760 hours.   CBG: Recent Labs  Lab 11/09/19 1129 11/09/19 1600 11/09/19 2105 11/10/19 0747 11/10/19 1138  GLUCAP 127* 140* 164* 156* 102*    Coagulation Studies: No results for input(s): LABPROT, INR in the last 72 hours.   Imaging   CT ABDOMEN PELVIS WO CONTRAST  Result Date: 11/10/2019  CLINICAL DATA:  Evaluate for retroperitoneal hematoma.  Recent CABG EXAM: CT ABDOMEN AND PELVIS WITHOUT CONTRAST TECHNIQUE: Multidetector CT imaging of the abdomen and pelvis was performed following the standard protocol without IV contrast. COMPARISON:  11/01/2019 FINDINGS: Lower chest: Small right pleural effusion is unchanged. New left pleural effusion identified. Postoperative changes from recent CABG procedure. Hepatobiliary: No focal liver abnormality. The gallbladder is unremarkable. No biliary dilatation. Pancreas: Unremarkable. No pancreatic ductal dilatation or surrounding inflammatory  changes. Spleen: Normal in size without focal abnormality. Adrenals/Urinary Tract: Normal appearance of the adrenal glands. Bilateral in stage cystic kidneys. The urinary bladder is unremarkable for degree of distention. Stomach/Bowel: Enteric tube tip is in the second portion of the duodenum. Stomach is nondistended. Small bowel loops are increased in caliber measuring up to 3.6 cm. Nondilated distal small bowel loops are noted without discrete transition point. No pathologic dilatation of the colon. Vascular/Lymphatic: Aortic atherosclerosis without aneurysm. No abdominal or pelvic adenopathy. Reproductive: Prostate is unremarkable. Other: Chronic, peripherally calcified right ventral abdominal wall rectus sheath hematoma is unchanged measuring 8.4 x 4.4 cm. Fat stranding within the left inguinal region compatible with previous catheterization is similar to the comparison exam. No retroperitoneal or intraperitoneal hematoma identified. Musculoskeletal: No acute or significant osseous findings. IMPRESSION: 1. No retroperitoneal hematoma or intraperitoneal hematoma identified. Similar appearance of mild soft tissue stranding within the left inguinal region from previous catheterization. 2. Increase caliber of the small bowel loops are favored to represent postoperative ileus. Similar to previous study. 3. Stable right pleural effusion with new left pleural effusion. 4.  Aortic Atherosclerosis (ICD10-I70.0). Electronically Signed   By: Kerby Moors M.D.   On: 11/10/2019 10:54      Medications:     Current Medications: . sodium chloride   Intravenous Once  . bisacodyl  10 mg Oral [name]   Or  . bisacodyl  10 mg Rectal [name]  . Chlorhexidine Gluconate Cloth  6 each Topical [name]  . clopidogrel  75 mg Oral [name]  . darbepoetin (ARANESP) injection - NON-DIALYSIS  60 mcg Subcutaneous Q Sat-1800  . docusate sodium  100 mg Oral [name]  . feeding supplement (ENSURE ENLIVE)  237 mL Oral TID BM  . feeding  supplement (PRO-STAT SUGAR FREE 64)  30 mL Oral BID  . insulin aspart  0-9 Units Subcutaneous TID AC & HS  . mouth rinse  15 mL Mouth Rinse BID  . midodrine  15 mg Oral TID WC  . multivitamin  1 tablet Oral QHS  . pantoprazole  40 mg Oral [name]  . rosuvastatin  10 mg Oral q1800     Infusions: .  prismasol BGK 4/2.5 400 mL/hr at 11/10/19 0112  .  prismasol BGK 4/2.5 200 mL/hr at 11/09/19 1210  . sodium chloride Stopped (11/10/19 1001)  . amiodarone 30 mg/hr (11/10/19 1000)  . feeding supplement (VITAL AF 1.2 CAL) 1,000 mL (11/10/19 1325)  . heparin 10,000 units/ 20 mL infusion syringe 500 Units/hr (11/10/19 1320)  . meropenem (MERREM) IV Stopped (11/10/19 0914)  . norepinephrine (LEVOPHED) Adult infusion 26 mcg/min (11/10/19 1000)  . prismasol BGK 4/2.5 1 mL (11/10/19 0837)  . vancomycin Stopped (11/09/19 1850)       Assessment/Plan   1. S/P CABG x 3 AVR on 10/27/19+ IABP - IABP removed 4/3 - ECHO 11/09/19 EF 30-35%  -Remains on norepi 26 mcg -Volume manage with HD  2. Cardiogenic Shock  -Remains on norepi 26 mcg. CO-OX  41% --> repeat CO-OX was  67%.  -  May need to add dobutamine but with limited access can hold off.  -  Discussed with Dr Orvan Seen.  - Continue current regimen.    3. Acute Respiratory Failure -Extubated 4/4  - Back on bipap today.    4. ESRD  Nephrology following.   5. Anemia Hgb down to 8.1. Receiving 1UPRBCs today    6. Protein Malnutrition  Albumin 2.0   -Started on tube feeds.    7. Severe Deconditioning Needs aggressive PT   Length of Stay: [place], [name]  11/10/2019, 1:54 PM  Advanced Heart Failure Team Pager [phone] (M-F; 7a - 4p)  Please contact [facility] for night-coverage after hours (4p -7a ) and weekends on [email or web address]  Patient seen with [name], agree with the above note.   Patient is post- redo CABG + bioprosthetic AVR.  He has ESRD, echo with EF 30-35.  Currently with post-op cardiogenic shock on NE 26.  Initial co-ox  low this morning but repeat 67%.  He has had atrial fibrillation and VT post-op, currently a-paced on amiodarone gtt.   Abdominal pain today, CT abdomen with no hematoma, +ileus.    He remains on CVVH, currently running even.   General: Uncomfortable-appearing.  Neck: No JVD, no thyromegaly or thyroid nodule.  Lungs: Decreased at bases.  CV: Nondisplaced PMI.  Heart regular S1/S2, no S3/S4, no murmur.  No peripheral edema.  No carotid bruit.  Normal pedal pulses.  Abdomen: Soft, mildly tender, no hepatosplenomegaly, no distention.  Skin: Intact without lesions or rashes.  Neurologic: Alert and oriented x 3.  Psych: Normal affect. Extremities: No clubbing or cyanosis.  HEENT: Normal.   1. Cardiogenic shock: Post-op.  Echo 4/14 with EF 30-35%, stable aortic valve replacement.  He remains on norepinephrine 26 and midodrine.  Volume status difficult on exam, no recent CVP measured. Co-ox 67% when dran this afternoon.  - Asked nurse to measure CVP.  Currently CVVH running even.   - Difficult to obtain BP.  Will arrange for arterial line placement.  Wean NE as able, continue midodrine.  - With co-ox 67% currently, will not start inotrope (especially with increased risk of arrhythmias - has had AF and VT).  2. ESRD: Currently on CVVH, running UF even.   - Continue midodrine and titrate NE as able.  - Measure CVP to see if we need to be actively pulling fluid.  3. Possible aspiration: He is on empiric meropenem.  4. Atrial fibrillation: Paroxysmal.  Currently in NSR (a-paced) on amiodarone.  - Continue amiodarone gtt.  - Apixaban was held with abdominal pain, no hematoma on CT.  Will discuss restarting apixaban with Dr. Orvan Seen. Hgb 8.8 today.  5. Anemia: Hgb 8.8.  Post-op.  Transfuse hgb < 8.  6. CAD: Redo CABG this admission.  - Continue Plavix and Crestor.  7. VT: Amiodarone, quiescent.   Loralie Champagne 11/10/2019 4:50 PM

## 2019-11-10 NOTE — Progress Notes (Signed)
Patient ID: Patrick [name], male   DOB: 1956/04/23, 64 y.o.   MRN: [id number] S: Developed severe abdominal pain last night and some loose stools.   O:BP (!) 83/31   Pulse 100   Temp (!) 96.4 F (35.8 C) (Axillary)   Resp (!) 31   Ht 5' 6.5" (1.689 m)   Wt 100.9 kg   SpO2 100%   BMI 35.37 kg/m   Intake/Output Summary (Last 24 hours) at 11/10/2019 0953 Last data filed at 11/10/2019 0900 Gross per 24 hour  Intake 2763.17 ml  Output 4454 ml  Net -1690.83 ml   Intake/Output: I/O last 3 completed shifts: In: 3880.7 [P.O.:780; I.V.:1250.6; NG/GT:687.3; IV Piggyback:1162.7] Out: 8527 [POEUM:3536; Stool:825]  Intake/Output this shift:  Total I/O In: 126.5 [I.V.:74.9; IV Piggyback:51.6] Out: 184 [Other:184] Weight change: -1 kg Gen: chronically ill-appearing AAM in mild distress RWE:RXVQM Resp: cta Abd: hypoactive bowel sounds, firm, tender softball sized mass in suprapubic area Ext: 1+ upper extremity edema  Recent Labs  Lab 11/07/19 0110 11/07/19 0110 11/07/19 1642 11/08/19 0059 11/08/19 1800 11/09/19 0012 11/09/19 0411 11/09/19 1650 11/10/19 0347  NA 134*   < > 133* 135 127* 134* 134* 134* 135  K 4.8   < > 4.9 4.7 4.1 4.4 4.4 4.8 4.3  CL 99  --  99 104 96*  --  99 100 101  CO2 17*  --  22 21* 22  --  24 23 22   GLUCOSE 132*  --  141* 131* 170*  --  159* 260* 155*  BUN 31*  --  29* 25* 16  --  13 12 13   CREATININE 4.98*  --  4.50* 4.26* 2.79*  --  2.46* 2.21* 2.21*  ALBUMIN 2.2*  --  2.2* <1.0* 2.2*  --  2.1* 2.0* 2.0*  CALCIUM 6.8*  --  6.8* 5.9* 6.8*  --  7.3* 7.0* 7.2*  PHOS 2.1*  --  2.4* 2.0* 2.7  --  1.9* 1.6* 3.0   < > = values in this interval not displayed.   Liver Function Tests: Recent Labs  Lab 11/09/19 0411 11/09/19 1650 11/10/19 0347  ALBUMIN 2.1* 2.0* 2.0*   No results for input(s): LIPASE, AMYLASE in the last 168 hours. No results for input(s): AMMONIA in the last 168 hours. CBC: Recent Labs  Lab 11/05/19 0343 11/05/19 0343 11/06/19 0318  11/06/19 0318 11/07/19 0110 11/07/19 0110 11/08/19 0414 11/09/19 0012 11/10/19 0740  WBC 10.8*   < > 15.5*   < > 14.7*  --  14.5*  --  14.1*  HGB 9.1*   < > 10.1*   < > 8.5*   < > 8.5* 8.8* 8.1*  HCT 28.8*   < > 32.3*   < > 26.0*   < > 27.1* 26.0* 25.6*  MCV 96.3  --  97.3  --  94.2  --  98.2  --  99.2  PLT 85*   < > 102*   < > 118*  --  142*  --  143*   < > = values in this interval not displayed.   Cardiac Enzymes: No results for input(s): CKTOTAL, CKMB, CKMBINDEX, TROPONINI in the last 168 hours. CBG: Recent Labs  Lab 11/09/19 0613 11/09/19 1129 11/09/19 1600 11/09/19 2105 11/10/19 0747  GLUCAP 137* 127* 140* 164* 156*    Iron Studies: No results for input(s): IRON, TIBC, TRANSFERRIN, FERRITIN in the last 72 hours. Studies/Results: DG CHEST PORT 1 VIEW  Result Date: 11/08/2019 CLINICAL DATA:  Shortness  of breath EXAM: PORTABLE CHEST 1 VIEW COMPARISON:  Film from earlier in the same day. FINDINGS: Cardiac shadow remains enlarged. Postsurgical changes are again seen. Tunneled right PICC line and left dialysis catheter are again seen and stable. Right brachiocephalic stent is again identified and stable. Central vascular congestion is noted with effusion left slightly greater than right. Basilar atelectasis is again noted. No bony abnormality is seen. IMPRESSION: Tubes and lines as described above. Vascular congestion and bibasilar atelectasis. Small effusions are noted as well. Electronically Signed   By: Inez Catalina M.D.   On: 11/08/2019 23:49   DG Abd Portable 1V  Result Date: 11/09/2019 CLINICAL DATA:  Feeding tube placement. EXAM: PORTABLE ABDOMEN - 1 VIEW COMPARISON:  November 01, 2019. FINDINGS: Distal tip of feeding tube is seen in expected position of distal stomach. Mildly dilated small bowel loops are noted. IMPRESSION: Distal tip of feeding tube seen in expected position of distal stomach. Electronically Signed   By: Marijo Conception M.D.   On: 11/09/2019 12:27    ECHOCARDIOGRAM LIMITED  Result Date: 11/09/2019    ECHOCARDIOGRAM LIMITED REPORT   Patient Name:   Patrick [name] Date of Exam: 11/09/2019 Medical Rec #:  [id number]       Height:       66.5 in Accession #:    [id number]      Weight:       224.6 lb Date of Birth:  1955-09-15        BSA:          2.113 m Patient Age:    83 years        BP:           110/65 mmHg Patient Gender: M               HR:           102 bpm. Exam Location:  Inpatient Procedure: Limited Echo, Color Doppler, Cardiac Doppler and Intracardiac            Opacification Agent Indications:    W41.32 Acute diastolic (congestive) heart failure  History:        Patient has prior history of Echocardiogram examinations, most                 recent 11/01/2019. CHF, Prior CABG, PAD, Arrythmias:Atrial                 Fibrillation and Atrial Flutter; Risk Factors:Diabetes and                 Dyslipidemia. 10/27/19 CABG and Aortic Valve Replacement with 55mm                 [employer].  Sonographer:    Raquel Sarna Senior RDCS Referring Phys: [id number] [place]  1. Left ventricular ejection fraction, by estimation, is 30 to 35%. The left ventricle has moderately decreased function. The left ventricle demonstrates regional wall motion abnormalities (see scoring diagram/findings for description). Left ventricular  diastolic parameters are consistent with Grade I diastolic dysfunction (impaired relaxation). There is severe akinesis of the left ventricular, apical anteroseptal wall and inferoseptal wall.  2. The mitral valve is grossly normal. Mild mitral valve regurgitation.  3. The aortic valve has been repaired/replaced. FINDINGS  Left Ventricle: Left ventricular ejection fraction, by estimation, is 30 to 35%. The left ventricle has moderately decreased function. The left ventricle demonstrates regional wall motion abnormalities. Severe akinesis of the left ventricular, apical  anteroseptal wall and inferoseptal wall. Mitral Valve: The  mitral valve is grossly normal. There is mild thickening of the mitral valve leaflet(s). There is mild calcification of the mitral valve leaflet(s). Mild mitral valve regurgitation. Aortic Valve: The aortic valve has been repaired/replaced. There is a bioprosthetic valve present in the aortic position. Pulmonic Valve: The pulmonic valve was grossly normal. Pulmonic valve regurgitation is not visualized. No evidence of pulmonic stenosis. Mertie Moores MD Electronically signed by Mertie Moores MD Signature Date/Time: 11/09/2019/10:39:28 AM    Final    . sodium chloride   Intravenous Once  . bisacodyl  10 mg Oral Daily   Or  . bisacodyl  10 mg Rectal Daily  . Chlorhexidine Gluconate Cloth  6 each Topical Daily  . clopidogrel  75 mg Oral Daily  . darbepoetin (ARANESP) injection - NON-DIALYSIS  60 mcg Subcutaneous Q Sat-1800  . docusate sodium  100 mg Oral Daily  . feeding supplement (ENSURE ENLIVE)  237 mL Oral TID BM  . feeding supplement (PRO-STAT SUGAR FREE 64)  30 mL Oral BID  . feeding supplement (VITAL 1.5 CAL)  1,000 mL Per Tube Q24H  . insulin aspart  0-9 Units Subcutaneous TID AC & HS  . mouth rinse  15 mL Mouth Rinse BID  . midodrine  15 mg Oral TID WC  . multivitamin  1 tablet Oral QHS  . pantoprazole  40 mg Oral Daily  . rosuvastatin  10 mg Oral q1800    BMET    Component Value Date/Time   NA 135 11/10/2019 0347   NA 139 11/30/2017 1008   K 4.3 11/10/2019 0347   CL 101 11/10/2019 0347   CO2 22 11/10/2019 0347   GLUCOSE 155 (H) 11/10/2019 0347   BUN 13 11/10/2019 0347   BUN 48 (H) 11/30/2017 1008   CREATININE 2.21 (H) 11/10/2019 0347   CALCIUM 7.2 (L) 11/10/2019 0347   GFRNONAA 30 (L) 11/10/2019 0347   GFRAA 35 (L) 11/10/2019 0347   CBC    Component Value Date/Time   WBC 14.1 (H) 11/10/2019 0740   RBC 2.58 (L) 11/10/2019 0740   HGB 8.1 (L) 11/10/2019 0740   HGB 11.9 (L) 11/30/2017 1008   HCT 25.6 (L) 11/10/2019 0740   HCT 35.3 (L) 11/30/2017 1008   PLT 143 (L)  11/10/2019 0740   PLT 197 11/30/2017 1008   MCV 99.2 11/10/2019 0740   MCV 92 11/30/2017 1008   MCH 31.4 11/10/2019 0740   MCHC 31.6 11/10/2019 0740   RDW 21.2 (H) 11/10/2019 0740   RDW 16.8 (H) 11/30/2017 1008   LYMPHSABS 1.0 10/24/2019 0737   MONOABS 0.7 10/24/2019 0737   EOSABS 0.2 10/24/2019 0737   BASOSABS 0.1 10/24/2019 0737     [facility] 4h 99.5 2K/3.5Ca P4 TDC Hep 3000 - mircera 75 last 3/22 - hect 1   Assessment/ Plan: 1. Abdominal pain- developed last night and now with palpable, tender mass in lower abdomen.  Bladder scan was negative so concerning for a hematoma although Hgb stable.   1. Agree with CT of abdomen as ischemic bowel is also on DDx. 2. May need to stop heparin with CRRT if he does have an hematoma 2. NSTEMI/CAD s/p redo CABG 04/03/66 complicated by hypotension and volume overload. 3. Cardiogenic shock - recurrent , back on pressors 4. Resp failure - suspected pulm edema d/t vol excess. improved with UF of 1.6 liters overnight.  5. ESRD - usual HD MWF, sp CRRT 4/1due to ongoing issues  with hypotension and volume overload. Was doing well on CVVHD untiltheeveningof 11/06/19 when he developeda fib/RVR and VTach with hypotension. CVVHD was stoppedthat night and then resumed 11/07/19. He has done well overnight but no UF.  1. Will decrease UF goal to keep even for now given tachycardiaand hypotension.  6. Anemia abl/ ckd - tsat 16%, ferr 1293. Due for esa on 4/5,startedAranesp60 qwk SQ 1st dose 4/10.If continues to fall will need blood transfusion. 7. S/p AVR- good valve function per ECHO. 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 Will replete with IV sodium phos 9. Protein malnutrition- severe with low albumin.  Improved after given IV albumin 4/13/21as well to help with bp and HR with UF.   Donetta Potts, MD [employer] [phone]

## 2019-11-11 DIAGNOSIS — I214 Non-ST elevation (NSTEMI) myocardial infarction: Secondary | ICD-10-CM | POA: Diagnosis not present

## 2019-11-11 DIAGNOSIS — N186 End stage renal disease: Secondary | ICD-10-CM | POA: Diagnosis not present

## 2019-11-11 DIAGNOSIS — R570 Cardiogenic shock: Secondary | ICD-10-CM | POA: Diagnosis not present

## 2019-11-11 LAB — TYPE AND SCREEN
ABO/RH(D): O POS
Antibody Screen: POSITIVE
Unit division: 0

## 2019-11-11 LAB — APTT: aPTT: 58 seconds — ABNORMAL HIGH (ref 24–36)

## 2019-11-11 LAB — POCT ACTIVATED CLOTTING TIME
Activated Clotting Time: 191 seconds
Activated Clotting Time: 191 seconds
Activated Clotting Time: 197 seconds
Activated Clotting Time: 197 seconds

## 2019-11-11 LAB — BPAM RBC
Blood Product Expiration Date: 202105102359
ISSUE DATE / TIME: 202104151308
Unit Type and Rh: 5100

## 2019-11-11 LAB — MAGNESIUM: Magnesium: 2.2 mg/dL (ref 1.7–2.4)

## 2019-11-11 LAB — RENAL FUNCTION PANEL
Albumin: 1.9 g/dL — ABNORMAL LOW (ref 3.5–5.0)
Albumin: 1.8 g/dL — ABNORMAL LOW (ref 3.5–5.0)
Anion gap: 8 (ref 5–15)
Anion gap: 8 (ref 5–15)
BUN: 13 mg/dL (ref 8–23)
BUN: 12 mg/dL (ref 8–23)
CO2: 23 mmol/L (ref 22–32)
CO2: 23 mmol/L (ref 22–32)
Calcium: 7.1 mg/dL — ABNORMAL LOW (ref 8.9–10.3)
Calcium: 7 mg/dL — ABNORMAL LOW (ref 8.9–10.3)
Chloride: 104 mmol/L (ref 98–111)
Chloride: 104 mmol/L (ref 98–111)
Creatinine, Ser: 2.03 mg/dL — ABNORMAL HIGH (ref 0.61–1.24)
Creatinine, Ser: 2.01 mg/dL — ABNORMAL HIGH (ref 0.61–1.24)
GFR calc Af Amer: 39 mL/min — ABNORMAL LOW (ref >60)
GFR calc Af Amer: 39 mL/min — ABNORMAL LOW (ref >60)
GFR calc non Af Amer: 33 mL/min — ABNORMAL LOW (ref >60)
GFR calc non Af Amer: 34 mL/min — ABNORMAL LOW (ref >60)
Glucose, Bld: 157 mg/dL — ABNORMAL HIGH (ref 70–99)
Glucose, Bld: 179 mg/dL — ABNORMAL HIGH (ref 70–99)
Phosphorus: 2 mg/dL — ABNORMAL LOW (ref 2.5–4.6)
Phosphorus: 3.5 mg/dL (ref 2.5–4.6)
Potassium: 4.3 mmol/L (ref 3.5–5.1)
Potassium: 4.4 mmol/L (ref 3.5–5.1)
Sodium: 135 mmol/L (ref 135–145)
Sodium: 135 mmol/L (ref 135–145)

## 2019-11-11 LAB — POCT I-STAT 7, (LYTES, BLD GAS, ICA,H+H)
Acid-base deficit: 1 mmol/L (ref 0.0–2.0)
Bicarbonate: 22.9 mmol/L (ref 20.0–28.0)
Calcium, Ion: 0.97 mmol/L — ABNORMAL LOW (ref 1.15–1.40)
HCT: 26 % — ABNORMAL LOW (ref 39.0–52.0)
Hemoglobin: 8.8 g/dL — ABNORMAL LOW (ref 13.0–17.0)
O2 Saturation: 98 %
Patient temperature: 98.7
Potassium: 4 mmol/L (ref 3.5–5.1)
Sodium: 139 mmol/L (ref 135–145)
TCO2: 24 mmol/L (ref 22–32)
pCO2 arterial: 35.8 mmHg (ref 32.0–48.0)
pH, Arterial: 7.414 (ref 7.350–7.450)
pO2, Arterial: 103 mmHg (ref 83.0–108.0)

## 2019-11-11 LAB — GLUCOSE, CAPILLARY
Glucose-Capillary: 126 mg/dL — ABNORMAL HIGH (ref 70–99)
Glucose-Capillary: 129 mg/dL — ABNORMAL HIGH (ref 70–99)
Glucose-Capillary: 131 mg/dL — ABNORMAL HIGH (ref 70–99)
Glucose-Capillary: 135 mg/dL — ABNORMAL HIGH (ref 70–99)
Glucose-Capillary: 129 mg/dL — ABNORMAL HIGH (ref 70–99)

## 2019-11-11 LAB — CULTURE, BLOOD (ROUTINE X 2)
Culture: NO GROWTH
Special Requests: ADEQUATE

## 2019-11-11 LAB — CBC
HCT: 27.9 % — ABNORMAL LOW (ref 39.0–52.0)
Hemoglobin: 8.6 g/dL — ABNORMAL LOW (ref 13.0–17.0)
MCH: 29.2 pg (ref 26.0–34.0)
MCHC: 30.8 g/dL (ref 30.0–36.0)
MCV: 94.6 fL (ref 80.0–100.0)
Platelets: 143 10*3/uL — ABNORMAL LOW (ref 150–400)
RBC: 2.95 MIL/uL — ABNORMAL LOW (ref 4.22–5.81)
RDW: 21.1 % — ABNORMAL HIGH (ref 11.5–15.5)
WBC: 13.5 10*3/uL — ABNORMAL HIGH (ref 4.0–10.5)
nRBC: 0.3 % — ABNORMAL HIGH (ref 0.0–0.2)

## 2019-11-11 LAB — COOXEMETRY PANEL
Carboxyhemoglobin: 1.3 % (ref 0.5–1.5)
Methemoglobin: 1.4 % (ref 0.0–1.5)
O2 Saturation: 52.9 %
Total hemoglobin: 8.8 g/dL — ABNORMAL LOW (ref 12.0–16.0)

## 2019-11-11 MED ORDER — VASOPRESSIN 20 UNIT/ML IV SOLN
0.0300 [IU]/min | INTRAVENOUS | Status: DC
  Administered 2019-11-11 – 2019-11-28 (×12): 0.03 [IU]/min via INTRAVENOUS
  Filled 2019-11-11 (×21): qty 2

## 2019-11-11 MED ORDER — APIXABAN 5 MG PO TABS
5.0000 mg | ORAL_TABLET | Freq: Two times a day (BID) | ORAL | Status: DC
  Administered 2019-11-11 – 2019-11-18 (×14): 5 mg via ORAL
  Filled 2019-11-11 (×15): qty 1

## 2019-11-11 MED ORDER — SODIUM PHOSPHATES 45 MMOLE/15ML IV SOLN
30.0000 mmol | Freq: Once | INTRAVENOUS | Status: AC
  Administered 2019-11-11: 12:00:00 30 mmol via INTRAVENOUS
  Filled 2019-11-11: qty 10

## 2019-11-11 NOTE — Progress Notes (Signed)
Patient taken off Bipap after a procedure.  Patient tolerating Brogan 6 L well at this time. RN at bedside.

## 2019-11-11 NOTE — Progress Notes (Signed)
RN notified patient is now off Bipap.

## 2019-11-11 NOTE — Progress Notes (Signed)
CT surgery p.m. Rounds  Patient status redo CABG and AVR Intermittently on BiPAP, ABG earlier today satisfactory Volume overloaded, CVVH removing 25-50 cc/h now that blood pressure improved on vasopressin Patient alert and responsive Incisions appear to be intact and clean

## 2019-11-11 NOTE — Procedures (Signed)
Central Venous Catheter Insertion Procedure Note Patrick [name] [id number] [birth year]  Procedure: Insertion of Central Venous Catheter Indications: Assessment of intravascular volume, Drug and/or fluid administration and Frequent blood sampling  Procedure Details Consent: Risks of procedure as well as the alternatives and risks of each were explained to the (patient/caregiver).  Consent for procedure obtained. Time Out: Verified patient identification, verified procedure, site/side was marked, verified correct patient position, special equipment/implants available, medications/allergies/relevent history reviewed, required imaging and test results available.  Performed  Maximum sterile technique was used including antiseptics, cap, gloves, gown, hand hygiene, mask and sheet. Skin prep: Chlorhexidine; local anesthetic administered A antimicrobial bonded/coated triple lumen catheter was placed in the right femoral vein due to multiple attempts, no other available access using the Seldinger technique. Ultrasound guidance used.Yes.   Catheter placed to 20 cm. Blood aspirated via all 3 ports and then flushed x 3. Line sutured x 2 and dressing applied.  Evaluation Blood flow good Complications: No apparent complications Patient did tolerate procedure well. Chest X-ray ordered to verify placement.  CXR: not needed femoral line.  Richardson Landry Tanina Barb ACNP Acute Care Nurse Practitioner [place] Please consult Amion 11/11/2019, 10:38 AM

## 2019-11-11 NOTE — Progress Notes (Signed)
PT Cancellation Note  Patient Details Name: Patrick [name] MRN: [id number] DOB: Sep 26, 1955   Cancelled Treatment:    Reason Eval/Treat Not Completed: Medical issues which prohibited therapy. Pt hypotensive on pressors.   Shary Decamp [facility] 11/11/2019, 10:32 AM [place] Pager [phone] Office [phone]

## 2019-11-11 NOTE — Progress Notes (Signed)
Patient requested to go back on Bipap for a nap.  Family and RN at bedside.

## 2019-11-11 NOTE — Progress Notes (Signed)
Patient taken off Bipap and placed on Ch[place] 6 L with humidity. Patient tolerated well. RN at bedside.

## 2019-11-11 NOTE — Progress Notes (Signed)
Patient ID: Patrick [name], male   DOB: June 20, 1956, 64 y.o.   MRN: [id number] S: Currently on max levophed and was on Bipap overnight. O:BP (!) 92/45   Pulse 100   Temp 98.7 F (37.1 C)   Resp (!) 28   Ht 5' 6.5" (1.689 m)   Wt 101.8 kg   SpO2 99%   BMI 35.68 kg/m   Intake/Output Summary (Last 24 hours) at 11/11/2019 1004 Last data filed at 11/11/2019 0900 Gross per 24 hour  Intake 2280.32 ml  Output 2429 ml  Net -148.68 ml   Intake/Output: I/O last 3 completed shifts: In: 4032.2 [P.O.:300; I.V.:1641.7; NG/GT:1061.7; IV Piggyback:1028.8] Out: 4893 [Other:3768; Stool:1125]  Intake/Output this shift:  Total I/O In: 152.5 [I.V.:112.5; NG/GT:40] Out: 199 [Other:199] Weight change: 0.9 kg Gen: mild distress CVS: tachy Resp: diminished bs, poor inspiratory effort Abd: +BS, soft, mildly tender Ext: 1+ upper extremity edema  Recent Labs  Lab 11/08/19 0059 11/08/19 0059 11/08/19 1800 11/09/19 0012 11/09/19 0411 11/09/19 0411 11/09/19 1650 11/10/19 0347 11/10/19 1226 11/10/19 1234 11/10/19 1453 11/11/19 0352 11/11/19 0915  NA 135   < > 127*   < > 134*   < > 134* 135 135 137 136 135 139  K 4.7   < > 4.1   < > 4.4   < > 4.8 4.3 4.1 4.2 4.4 4.3 4.0  CL 104  --  96*  --  99  --  100 101  --   --  103 104  --   CO2 21*  --  22  --  24  --  23 22  --   --  23 23  --   GLUCOSE 131*  --  170*  --  159*  --  260* 155*  --   --  140* 157*  --   BUN 25*  --  16  --  13  --  12 13  --   --  12 13  --   CREATININE 4.26*  --  2.79*  --  2.46*  --  2.21* 2.21*  --   --  2.32* 2.03*  --   ALBUMIN <1.0*  --  2.2*  --  2.1*  --  2.0* 2.0*  --   --  2.0* 1.9*  --   CALCIUM 5.9*  --  6.8*  --  7.3*  --  7.0* 7.2*  --   --  7.1* 7.1*  --   PHOS 2.0*  --  2.7  --  1.9*  --  1.6* 3.0  --   --  2.4* 2.0*  --    < > = values in this interval not displayed.   Liver Function Tests: Recent Labs  Lab 11/10/19 0347 11/10/19 1453 11/11/19 0352  ALBUMIN 2.0* 2.0* 1.9*   No results for  input(s): LIPASE, AMYLASE in the last 168 hours. No results for input(s): AMMONIA in the last 168 hours. CBC: Recent Labs  Lab 11/06/19 0318 11/06/19 0318 11/07/19 0110 11/07/19 0110 11/08/19 0414 11/09/19 0012 11/10/19 0740 11/10/19 1226 11/10/19 1234 11/11/19 0352 11/11/19 0915  WBC 15.5*   < > 14.7*   < > 14.5*  --  14.1*  --   --  13.5*  --   HGB 10.1*   < > 8.5*   < > 8.5*   < > 8.1*   < > 8.8* 8.6* 8.8*  HCT 32.3*   < > 26.0*   < > 27.1*   < >  25.6*   < > 26.0* 27.9* 26.0*  MCV 97.3  --  94.2  --  98.2  --  99.2  --   --  94.6  --   PLT 102*   < > 118*   < > 142*  --  143*  --   --  143*  --    < > = values in this interval not displayed.   Cardiac Enzymes: No results for input(s): CKTOTAL, CKMB, CKMBINDEX, TROPONINI in the last 168 hours. CBG: Recent Labs  Lab 11/10/19 1138 11/10/19 1521 11/10/19 2132 11/11/19 0633 11/11/19 0751  GLUCAP 102* 125* 129* 126* 129*    Iron Studies: No results for input(s): IRON, TIBC, TRANSFERRIN, FERRITIN in the last 72 hours. Studies/Results: CT ABDOMEN PELVIS WO CONTRAST  Result Date: 11/10/2019 CLINICAL DATA:  Evaluate for retroperitoneal hematoma.  Recent CABG EXAM: CT ABDOMEN AND PELVIS WITHOUT CONTRAST TECHNIQUE: Multidetector CT imaging of the abdomen and pelvis was performed following the standard protocol without IV contrast. COMPARISON:  11/01/2019 FINDINGS: Lower chest: Small right pleural effusion is unchanged. New left pleural effusion identified. Postoperative changes from recent CABG procedure. Hepatobiliary: No focal liver abnormality. The gallbladder is unremarkable. No biliary dilatation. Pancreas: Unremarkable. No pancreatic ductal dilatation or surrounding inflammatory changes. Spleen: Normal in size without focal abnormality. Adrenals/Urinary Tract: Normal appearance of the adrenal glands. Bilateral in stage cystic kidneys. The urinary bladder is unremarkable for degree of distention. Stomach/Bowel: Enteric tube tip is  in the second portion of the duodenum. Stomach is nondistended. Small bowel loops are increased in caliber measuring up to 3.6 cm. Nondilated distal small bowel loops are noted without discrete transition point. No pathologic dilatation of the colon. Vascular/Lymphatic: Aortic atherosclerosis without aneurysm. No abdominal or pelvic adenopathy. Reproductive: Prostate is unremarkable. Other: Chronic, peripherally calcified right ventral abdominal wall rectus sheath hematoma is unchanged measuring 8.4 x 4.4 cm. Fat stranding within the left inguinal region compatible with previous catheterization is similar to the comparison exam. No retroperitoneal or intraperitoneal hematoma identified. Musculoskeletal: No acute or significant osseous findings. IMPRESSION: 1. No retroperitoneal hematoma or intraperitoneal hematoma identified. Similar appearance of mild soft tissue stranding within the left inguinal region from previous catheterization. 2. Increase caliber of the small bowel loops are favored to represent postoperative ileus. Similar to previous study. 3. Stable right pleural effusion with new left pleural effusion. 4.  Aortic Atherosclerosis (ICD10-I70.0). Electronically Signed   By: Kerby Moors M.D.   On: 11/10/2019 10:54   DG Abd Portable 1V  Result Date: 11/09/2019 CLINICAL DATA:  Feeding tube placement. EXAM: PORTABLE ABDOMEN - 1 VIEW COMPARISON:  November 01, 2019. FINDINGS: Distal tip of feeding tube is seen in expected position of distal stomach. Mildly dilated small bowel loops are noted. IMPRESSION: Distal tip of feeding tube seen in expected position of distal stomach. Electronically Signed   By: Marijo Conception M.D.   On: 11/09/2019 12:27   . sodium chloride   Intravenous Once  . bisacodyl  10 mg Oral Daily   Or  . bisacodyl  10 mg Rectal Daily  . chlorhexidine  15 mL Mouth Rinse BID  . Chlorhexidine Gluconate Cloth  6 each Topical Daily  . clopidogrel  75 mg Oral Daily  . darbepoetin  (ARANESP) injection - NON-DIALYSIS  60 mcg Subcutaneous Q Sat-1800  . docusate sodium  100 mg Oral Daily  . feeding supplement (ENSURE ENLIVE)  237 mL Oral TID BM  . feeding supplement (PRO-STAT SUGAR FREE 64)  30 mL Oral BID  . insulin aspart  0-9 Units Subcutaneous TID AC & HS  . magic mouthwash w/lidocaine  5 mL Oral QID  . mouth rinse  15 mL Mouth Rinse BID  . mouth rinse  15 mL Mouth Rinse q12n4p  . midodrine  15 mg Oral TID WC  . multivitamin  1 tablet Oral QHS  . pantoprazole  40 mg Oral Daily  . rosuvastatin  10 mg Oral q1800    BMET    Component Value Date/Time   NA 139 11/11/2019 0915   NA 139 11/30/2017 1008   K 4.0 11/11/2019 0915   CL 104 11/11/2019 0352   CO2 23 11/11/2019 0352   GLUCOSE 157 (H) 11/11/2019 0352   BUN 13 11/11/2019 0352   BUN 48 (H) 11/30/2017 1008   CREATININE 2.03 (H) 11/11/2019 0352   CALCIUM 7.1 (L) 11/11/2019 0352   GFRNONAA 33 (L) 11/11/2019 0352   GFRAA 39 (L) 11/11/2019 0352   CBC    Component Value Date/Time   WBC 13.5 (H) 11/11/2019 0352   RBC 2.95 (L) 11/11/2019 0352   HGB 8.8 (L) 11/11/2019 0915   HGB 11.9 (L) 11/30/2017 1008   HCT 26.0 (L) 11/11/2019 0915   HCT 35.3 (L) 11/30/2017 1008   PLT 143 (L) 11/11/2019 0352   PLT 197 11/30/2017 1008   MCV 94.6 11/11/2019 0352   MCV 92 11/30/2017 1008   MCH 29.2 11/11/2019 0352   MCHC 30.8 11/11/2019 0352   RDW 21.1 (H) 11/11/2019 0352   RDW 16.8 (H) 11/30/2017 1008   LYMPHSABS 1.0 10/24/2019 0737   MONOABS 0.7 10/24/2019 0737   EOSABS 0.2 10/24/2019 0737   BASOSABS 0.1 10/24/2019 0737     [facility] 4h 99.5 2K/3.5Ca P4 TDC Hep 3000 - mircera 75 last 3/22 - hect 1   Assessment/ Plan: 1. Abdominal pain- improved.   1. CT of abdomen was unrevealing. 2. NSTEMI/CAD s/p redo CABG 09/27/80 complicated by hypotension and volume overload. 3. Cardiogenic shock - recurrent , now on max pressors and planning on adding vasopressin to pull fluid as tolerated.   4. Resp failure - suspected pulm edema d/t vol excess.improved with UF of 1.6 liters overnight.  5. ESRD - usual HD MWF, sp CRRT 4/1due to ongoing issues with hypotension and volume overload. Was doing well on CVVHD untiltheeveningof 11/06/19 when he developeda fib/RVR and VTach with hypotension. CVVHD was stoppedthat night and then resumed 11/07/19. He has done well overnight but no UF.  1. Will decrease UF goal tokeep even for now given tachycardiaand hypotension.  6. Anemia abl/ ckd - tsat 16%, ferr 1293. Due for esa on 4/5,startedAranesp60 qwk SQ 1st dose 4/10.If continues to fall will need blood transfusion. 7. S/p AVR- good valve function per ECHO. 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 Will replete with IV sodium phos 9. Protein malnutrition- severe with low albumin.Improved after givenIV albumin 4/13/21as well to help with bp and HR with UF.  Donetta Potts, MD [employer] [phone]

## 2019-11-11 NOTE — Progress Notes (Addendum)
Advanced Heart Failure Rounding Note  PCP-Cardiologist: Dorris Carnes, MD     Patient Profile   64 y/o male w/ h/o CAD s/p remote CABG in 1997 w/ LIMA-LAD, failed kidney transplant, end-stage renal disease on dialysis admitted w/ CP. LHC w/ severe CAD. Echo w/ reduced EF 35-40% and moderate AI. S/p redo CABG x 3 on 4/1 (SVG-LAD, SVG-PDA, SVG-OM) + bioprosthetic AVR. Post operative course c/b by cardiogenic shock, respiratory failure, ESRD, VT, and protein malnutrition.    Subjective:    Required BIPAP overnight. RT just removed. Now on 6L [place]. ABG ok.   On high dose NE, now at 39 mcg + midodrine 15 tid. MAPs low- mid 60s. Limited IV access. Just lost peripheral access. RN unable to draw co-ox currently w/o pausing NE.   On CVVH for volume removal.   No cardiac complaints. Denies CP. No dyspnea. NSR on tele w/ frequent Ventricular ectopy   Objective:   Weight Range: 101.8 kg Body mass index is 35.68 kg/m.   Vital Signs:   Temp:  [96.4 F (35.8 C)-99.5 F (37.5 C)] 98.6 F (37 C) (04/16 0400) Pulse Rate:  [68-126] 100 (04/16 0700) Resp:  [18-40] 21 (04/16 0700) BP: (67-142)/(31-121) 98/51 (04/16 0000) SpO2:  [74 %-100 %] 100 % (04/16 0700) Arterial Line BP: (79-128)/(39-57) 98/52 (04/16 0700) FiO2 (%):  [50 %] 50 % (04/16 0416) Weight:  [101.8 kg] 101.8 kg (04/16 0355) Last BM Date: 11/10/19  Weight change: Filed Weights   11/09/19 0500 11/10/19 0500 11/11/19 0355  Weight: 101.9 kg 100.9 kg 101.8 kg    Intake/Output:   Intake/Output Summary (Last 24 hours) at 11/11/2019 0713 Last data filed at 11/11/2019 0700 Gross per 24 hour  Intake 2350.26 ml  Output 2414 ml  Net -63.74 ml      Physical Exam    General:  Chronically ill appearing, moderately obese AAM. No resp difficulty HEENT: Normal Neck: Supple. Elevated JVD. +HD cath. Carotids 2+ bilat; no bruits. No lymphadenopathy or thyromegaly appreciated. Cor: PMI nondisplaced. Regular rate & rhythm. No rubs,  gallops or murmurs. Sternotomy site stable  Lungs: decreased BS at the bases  Abdomen: Soft, nontender, nondistended. No hepatosplenomegaly. No bruits or masses. Good bowel sounds. Extremities: No cyanosis, clubbing, rash, edema, feet are cool to touch  Neuro: Alert & orientedx3, cranial nerves grossly intact. moves all 4 extremities w/o difficulty. Affect pleasant   Telemetry   NSR w/ frequent ventricular ectopy  EKG    No new EKG to review   Labs    CBC Recent Labs    11/10/19 0740 11/10/19 1226 11/10/19 1234 11/11/19 0352  WBC 14.1*  --   --  13.5*  HGB 8.1*   < > 8.8* 8.6*  HCT 25.6*   < > 26.0* 27.9*  MCV 99.2  --   --  94.6  PLT 143*  --   --  143*   < > = values in this interval not displayed.   Basic Metabolic Panel Recent Labs    11/10/19 0347 11/10/19 1226 11/10/19 1453 11/11/19 0352  NA 135   < > 136 135  K 4.3   < > 4.4 4.3  CL 101  --  103 104  CO2 22  --  23 23  GLUCOSE 155*  --  140* 157*  BUN 13  --  12 13  CREATININE 2.21*  --  2.32* 2.03*  CALCIUM 7.2*  --  7.1* 7.1*  MG 2.4  --   --  2.2  PHOS 3.0  --  2.4* 2.0*   < > = values in this interval not displayed.   Liver Function Tests Recent Labs    11/10/19 1453 11/11/19 0352  ALBUMIN 2.0* 1.9*   No results for input(s): LIPASE, AMYLASE in the last 72 hours. Cardiac Enzymes No results for input(s): CKTOTAL, CKMB, CKMBINDEX, TROPONINI in the last 72 hours.  BNP: BNP (last 3 results) No results for input(s): BNP in the last 8760 hours.  ProBNP (last 3 results) No results for input(s): PROBNP in the last 8760 hours.   D-Dimer No results for input(s): DDIMER in the last 72 hours. Hemoglobin A1C No results for input(s): HGBA1C in the last 72 hours. Fasting Lipid Panel No results for input(s): CHOL, HDL, LDLCALC, TRIG, CHOLHDL, LDLDIRECT in the last 72 hours. Thyroid Function Tests No results for input(s): TSH, T4TOTAL, T3FREE, THYROIDAB in the last 72 hours.  Invalid input(s):  FREET3  Other results:   Imaging    CT ABDOMEN PELVIS WO CONTRAST  Result Date: 11/10/2019 CLINICAL DATA:  Evaluate for retroperitoneal hematoma.  Recent CABG EXAM: CT ABDOMEN AND PELVIS WITHOUT CONTRAST TECHNIQUE: Multidetector CT imaging of the abdomen and pelvis was performed following the standard protocol without IV contrast. COMPARISON:  11/01/2019 FINDINGS: Lower chest: Small right pleural effusion is unchanged. New left pleural effusion identified. Postoperative changes from recent CABG procedure. Hepatobiliary: No focal liver abnormality. The gallbladder is unremarkable. No biliary dilatation. Pancreas: Unremarkable. No pancreatic ductal dilatation or surrounding inflammatory changes. Spleen: Normal in size without focal abnormality. Adrenals/Urinary Tract: Normal appearance of the adrenal glands. Bilateral in stage cystic kidneys. The urinary bladder is unremarkable for degree of distention. Stomach/Bowel: Enteric tube tip is in the second portion of the duodenum. Stomach is nondistended. Small bowel loops are increased in caliber measuring up to 3.6 cm. Nondilated distal small bowel loops are noted without discrete transition point. No pathologic dilatation of the colon. Vascular/Lymphatic: Aortic atherosclerosis without aneurysm. No abdominal or pelvic adenopathy. Reproductive: Prostate is unremarkable. Other: Chronic, peripherally calcified right ventral abdominal wall rectus sheath hematoma is unchanged measuring 8.4 x 4.4 cm. Fat stranding within the left inguinal region compatible with previous catheterization is similar to the comparison exam. No retroperitoneal or intraperitoneal hematoma identified. Musculoskeletal: No acute or significant osseous findings. IMPRESSION: 1. No retroperitoneal hematoma or intraperitoneal hematoma identified. Similar appearance of mild soft tissue stranding within the left inguinal region from previous catheterization. 2. Increase caliber of the small bowel  loops are favored to represent postoperative ileus. Similar to previous study. 3. Stable right pleural effusion with new left pleural effusion. 4.  Aortic Atherosclerosis (ICD10-I70.0). Electronically Signed   By: Kerby Moors M.D.   On: 11/10/2019 10:54      Medications:     Scheduled Medications: . sodium chloride   Intravenous Once  . bisacodyl  10 mg Oral Daily   Or  . bisacodyl  10 mg Rectal Daily  . chlorhexidine  15 mL Mouth Rinse BID  . Chlorhexidine Gluconate Cloth  6 each Topical Daily  . clopidogrel  75 mg Oral Daily  . darbepoetin (ARANESP) injection - NON-DIALYSIS  60 mcg Subcutaneous Q Sat-1800  . docusate sodium  100 mg Oral Daily  . feeding supplement (ENSURE ENLIVE)  237 mL Oral TID BM  . feeding supplement (PRO-STAT SUGAR FREE 64)  30 mL Oral BID  . insulin aspart  0-9 Units Subcutaneous TID AC & HS  . magic mouthwash w/lidocaine  5 mL Oral QID  .  mouth rinse  15 mL Mouth Rinse BID  . mouth rinse  15 mL Mouth Rinse q12n4p  . midodrine  15 mg Oral TID WC  . multivitamin  1 tablet Oral QHS  . pantoprazole  40 mg Oral Daily  . rosuvastatin  10 mg Oral q1800     Infusions: .  prismasol BGK 4/2.5 1 each (11/10/19 1647)  .  prismasol BGK 4/2.5 1 each (11/10/19 1647)  . sodium chloride 10 mL/hr at 11/11/19 0700  . sodium chloride    . amiodarone 30 mg/hr (11/11/19 0700)  . feeding supplement (VITAL AF 1.2 CAL) 1,000 mL (11/10/19 1325)  . heparin 10,000 units/ 20 mL infusion syringe 500 Units/hr (11/10/19 1439)  . meropenem (MERREM) IV Stopped (11/11/19 0232)  . norepinephrine (LEVOPHED) Adult infusion 39 mcg/min (11/11/19 0700)  . prismasol BGK 4/2.5 1,800 mL/hr at 11/11/19 0428  . vancomycin Stopped (11/10/19 1958)     PRN Medications:  sodium chloride, Place/Maintain arterial line **AND** sodium chloride, acetaminophen, alteplase, dextrose, fentaNYL (SUBLIMAZE) injection, Gerhardt's butt cream, heparin, levalbuterol, metoprolol tartrate, ondansetron  (ZOFRAN) IV, oxyCODONE, sodium chloride, sodium chloride flush, traMADol   Assessment/Plan   1. Cardiogenic shock: Post-op.  Echo 4/14 with EF 30-35%, stable aortic valve replacement.  High pressor requirements. Now on NE 39 + midodrine 15 tid, MAPs low-mid 60s. Volume status difficult on exam, no recent CVP measured. Co-ox 67% yesterday. Unable to check co-ox this am due to limited vascular access.  - Will ask critical care to place femoral line. Will follow co-ox and CVP  - Continue NE. Add VP. Target MAP > 65/SBP > 90 - currently off bipap. On 6L [place].   - continues on CVVH per nephrology  2. ESRD: Currently on CVVH, running even with soft BP.   - Continue midodrine and wean NE as able. Add VP per above 3. Possible aspiration: He is on empiric vanco/meropenem.  4. Atrial fibrillation: Paroxysmal.  Currently in NSR (a-paced) on amiodarone.  - Continue amiodarone gtt, increase to 60 mg/hr given increased ventricular ectopy on tele - Apixaban was held with abdominal pain, no hematoma on CT.  Will discuss restarting apixaban with Dr. Orvan Seen. Hgb 8.6 today.  5. Anemia: Hgb 8.8.  Post-op.  Transfuse hgb < 8.  6. CAD: Redo CABG x 3 this admission (SVG-LAD, SVG-PDA, SVG-OM) - stable w/o angina  - Continue Plavix and Crestor.  7. VT: on Amiodarone gtt - now w/ frequent ventricular ectopy on tele - adding VP for BP support to allow down titration of NE - increase amiodarone gtt to 60 mg/ hr - electrolytes stable, K 4.3, Mg 2.2   Length of Stay: [address] PA-C  11/11/2019, 7:13 AM  Advanced Heart Failure Team Pager [phone] (M-F; 7a - 4p)  Please contact [facility] for night-coverage after hours (4p -7a ) and weekends on [email or web address]  Patient seen with PA, agree with the above note.   He is off Bipap today, denies abdominal pain.  Remains on CVVH but running even as MAP has been low.  Now on NE 39.  No co-ox today, was 67% yesterday.  CVP 13 this morning.  Lost his peripheral  IV access, access very limited.    General: NAD Neck: Thick, JVP 12 cm, no thyromegaly or thyroid nodule.  Lungs: Decreased at bases.  CV: Nondisplaced PMI.  Heart regular S1/S2, no S3/S4, 2/6 SEM RUSB.  1+ ankle edema.   Abdomen: Soft, nontender, no hepatosplenomegaly, no distention.  Skin: Intact  without lesions or rashes.  Neurologic: Alert and oriented x 3.  Psych: Normal affect. Extremities: No clubbing or cyanosis.  HEENT: Normal.   MAP remains marginal on CVVH. Cardiogenic shock.  - Continue NE, will place femoral line to allow addition of vasopressin.  - Send off co-ox when able to draw, was 67% yesterday.  - CVP 13, not pulling any fluid right now until MAP stabilizes.   He is in NSR today with frequent NSVT.  - Increase amiodarone to 60 mg/hr.   Possible aspiration, on meropenem/vancomycin.  - CXR today.   CRITICAL CARE Performed by: Loralie Champagne  Total critical care time: 35 minutes  Critical care time was exclusive of separately billable procedures and treating other patients.  Critical care was necessary to treat or prevent imminent or life-threatening deterioration.  Critical care was time spent personally by me on the following activities: development of treatment plan with patient and/or surrogate as well as nursing, discussions with consultants, evaluation of patient's response to treatment, examination of patient, obtaining history from patient or surrogate, ordering and performing treatments and interventions, ordering and review of laboratory studies, ordering and review of radiographic studies, pulse oximetry and re-evaluation of patient's condition.   Loralie Champagne 11/11/2019 10:44 AM

## 2019-11-12 ENCOUNTER — Inpatient Hospital Stay (HOSPITAL_COMMUNITY): Payer: Medicare HMO

## 2019-11-12 DIAGNOSIS — N186 End stage renal disease: Secondary | ICD-10-CM | POA: Diagnosis not present

## 2019-11-12 DIAGNOSIS — R570 Cardiogenic shock: Secondary | ICD-10-CM | POA: Diagnosis not present

## 2019-11-12 DIAGNOSIS — I214 Non-ST elevation (NSTEMI) myocardial infarction: Secondary | ICD-10-CM | POA: Diagnosis not present

## 2019-11-12 LAB — RENAL FUNCTION PANEL
Albumin: 1.9 g/dL — ABNORMAL LOW (ref 3.5–5.0)
Albumin: 1.7 g/dL — ABNORMAL LOW (ref 3.5–5.0)
Anion gap: 10 (ref 5–15)
Anion gap: 11 (ref 5–15)
BUN: 12 mg/dL (ref 8–23)
BUN: 14 mg/dL (ref 8–23)
CO2: 24 mmol/L (ref 22–32)
CO2: 23 mmol/L (ref 22–32)
Calcium: 7.1 mg/dL — ABNORMAL LOW (ref 8.9–10.3)
Calcium: 7 mg/dL — ABNORMAL LOW (ref 8.9–10.3)
Chloride: 102 mmol/L (ref 98–111)
Chloride: 100 mmol/L (ref 98–111)
Creatinine, Ser: 1.97 mg/dL — ABNORMAL HIGH (ref 0.61–1.24)
Creatinine, Ser: 2.14 mg/dL — ABNORMAL HIGH (ref 0.61–1.24)
GFR calc Af Amer: 40 mL/min — ABNORMAL LOW (ref >60)
GFR calc Af Amer: 36 mL/min — ABNORMAL LOW (ref >60)
GFR calc non Af Amer: 35 mL/min — ABNORMAL LOW (ref >60)
GFR calc non Af Amer: 31 mL/min — ABNORMAL LOW (ref >60)
Glucose, Bld: 155 mg/dL — ABNORMAL HIGH (ref 70–99)
Glucose, Bld: 189 mg/dL — ABNORMAL HIGH (ref 70–99)
Phosphorus: 2.3 mg/dL — ABNORMAL LOW (ref 2.5–4.6)
Phosphorus: 3 mg/dL (ref 2.5–4.6)
Potassium: 4.2 mmol/L (ref 3.5–5.1)
Potassium: 4.2 mmol/L (ref 3.5–5.1)
Sodium: 136 mmol/L (ref 135–145)
Sodium: 134 mmol/L — ABNORMAL LOW (ref 135–145)

## 2019-11-12 LAB — CBC
HCT: 26.3 % — ABNORMAL LOW (ref 39.0–52.0)
Hemoglobin: 8.1 g/dL — ABNORMAL LOW (ref 13.0–17.0)
MCH: 30.2 pg (ref 26.0–34.0)
MCHC: 30.8 g/dL (ref 30.0–36.0)
MCV: 98.1 fL (ref 80.0–100.0)
Platelets: 126 10*3/uL — ABNORMAL LOW (ref 150–400)
RBC: 2.68 MIL/uL — ABNORMAL LOW (ref 4.22–5.81)
RDW: 21.6 % — ABNORMAL HIGH (ref 11.5–15.5)
WBC: 11.2 10*3/uL — ABNORMAL HIGH (ref 4.0–10.5)
nRBC: 0.5 % — ABNORMAL HIGH (ref 0.0–0.2)

## 2019-11-12 LAB — GLUCOSE, CAPILLARY
Glucose-Capillary: 145 mg/dL — ABNORMAL HIGH (ref 70–99)
Glucose-Capillary: 151 mg/dL — ABNORMAL HIGH (ref 70–99)
Glucose-Capillary: 156 mg/dL — ABNORMAL HIGH (ref 70–99)

## 2019-11-12 LAB — COOXEMETRY PANEL
Carboxyhemoglobin: 1.5 % (ref 0.5–1.5)
Methemoglobin: 1.3 % (ref 0.0–1.5)
O2 Saturation: 60.8 %
Total hemoglobin: 9.3 g/dL — ABNORMAL LOW (ref 12.0–16.0)

## 2019-11-12 LAB — POCT ACTIVATED CLOTTING TIME
Activated Clotting Time: 191 seconds
Activated Clotting Time: 191 seconds
Activated Clotting Time: 197 seconds

## 2019-11-12 LAB — MAGNESIUM: Magnesium: 2.2 mg/dL (ref 1.7–2.4)

## 2019-11-12 LAB — APTT: aPTT: 56 seconds — ABNORMAL HIGH (ref 24–36)

## 2019-11-12 MED ORDER — SODIUM PHOSPHATES 45 MMOLE/15ML IV SOLN
20.0000 mmol | Freq: Once | INTRAVENOUS | Status: AC
  Administered 2019-11-12: 20 mmol via INTRAVENOUS
  Filled 2019-11-12: qty 6.67

## 2019-11-12 NOTE — Progress Notes (Signed)
16 Days Post-Op Procedure(s) (LRB): REDO CORONARY ARTERY BYPASS GRAFTING TIMES 3  USING LEFT GREATER SAPHENOUS LEG VEIN HARVESTED ENDOSCOPICALLY(CABG) (N/A) TRANSESOPHAGEAL ECHOCARDIOGRAM (TEE) (N/A) AORTIC VALVE REPLACEMENT (AVR) USING EDWARDS INTUITY 23 MM AORTIC VALVE. (N/A) Endovein Harvest Of Greater Saphenous Vein (Left) Subjective: Rested comfortably on BiPAP overnight Had good breakfast today We will increase fluid removal goal to 40 cc as blood pressure is improving with vasopressin and oral midodrine Surgical incisions clean and dry, on meropenem Pulmonary status stable this morning on 4 L nasal cannula  Objective: Vital signs in last 24 hours: Temp:  [97.8 F (36.6 C)-98.7 F (37.1 C)] 97.8 F (36.6 C) (04/17 0733) Pulse Rate:  [44-100] 97 (04/17 0800) Cardiac Rhythm: Atrial paced (04/17 0800) Resp:  [14-33] 21 (04/17 0800) BP: (90-119)/(38-45) 119/41 (04/16 1520) SpO2:  [79 %-100 %] 95 % (04/17 0800) Arterial Line BP: (89-138)/(36-53) 94/48 (04/17 0800) FiO2 (%):  [40 %-50 %] 40 % (04/17 0400)  Hemodynamic parameters for last 24 hours: CVP:  [4 mmHg-15 mmHg] 14 mmHg  Intake/Output from previous day: 04/16 0701 - 04/17 0700 In: 3298 [I.V.:1913.2; NG/GT:680; IV Piggyback:704.8] Out: 4569 [Stool:100] Intake/Output this shift: Total I/O In: 365.8 [P.O.:110; I.V.:132.3; NG/GT:40; IV Piggyback:83.5] Out: 318 [Other:318]  Exam Atrially paced Neuro intact Minimal edema Incision clean and dry  Lab Results: Recent Labs    11/11/19 0352 11/11/19 0352 11/11/19 0915 11/12/19 0331  WBC 13.5*  --   --  11.2*  HGB 8.6*   < > 8.8* 8.1*  HCT 27.9*   < > 26.0* 26.3*  PLT 143*  --   --  126*   < > = values in this interval not displayed.   BMET:  Recent Labs    11/11/19 1607 11/12/19 0331  NA 135 136  K 4.4 4.2  CL 104 102  CO2 23 24  GLUCOSE 179* 155*  BUN 12 12  CREATININE 2.01* 1.97*  CALCIUM 7.0* 7.1*    PT/INR: No results for input(s): LABPROT, INR  in the last 72 hours. ABG    Component Value Date/Time   PHART 7.414 11/11/2019 0915   HCO3 22.9 11/11/2019 0915   TCO2 24 11/11/2019 0915   ACIDBASEDEF 1.0 11/11/2019 0915   O2SAT 60.8 11/12/2019 0345   CBG (last 3)  Recent Labs    11/11/19 1508 11/11/19 2122 11/12/19 0732  GLUCAP 135* 129* 145*    Assessment/Plan: S/P Procedure(s) (LRB): REDO CORONARY ARTERY BYPASS GRAFTING TIMES 3  USING LEFT GREATER SAPHENOUS LEG VEIN HARVESTED ENDOSCOPICALLY(CABG) (N/A) TRANSESOPHAGEAL ECHOCARDIOGRAM (TEE) (N/A) AORTIC VALVE REPLACEMENT (AVR) USING EDWARDS INTUITY 23 MM AORTIC VALVE. (N/A) Endovein Harvest Of Greater Saphenous Vein (Left) Try to increase fluid removal as blood pressure is improving Continue supportive care   LOS: 19 days    Tharon Aquas Trigt III 11/12/2019

## 2019-11-12 NOTE — Progress Notes (Signed)
RT note- Placed back on Bipap by RN

## 2019-11-12 NOTE — Progress Notes (Addendum)
CT surgery p.m. Rounds  Patient had a stable day, remains a paced with stable blood pressure Removing 40 to 50 cc/h with CVVH Norepinephrine reduced from 28-25 mcg Taking good p.o.'s with meals

## 2019-11-12 NOTE — Plan of Care (Signed)
  Problem: Cardiac: Goal: Ability to achieve and maintain adequate cardiopulmonary perfusion will improve Outcome: Progressing   Problem: Clinical Measurements: Goal: Ability to maintain clinical measurements within normal limits will improve Outcome: Progressing Goal: Respiratory complications will improve Outcome: Progressing Goal: Cardiovascular complication will be avoided Outcome: Progressing   Problem: Nutrition: Goal: Adequate nutrition will be maintained Outcome: Progressing   Problem: Coping: Goal: Level of anxiety will decrease Outcome: Progressing   Problem: Pain Managment: Goal: General experience of comfort will improve Outcome: Progressing   Problem: Safety: Goal: Ability to remain free from injury will improve Outcome: Progressing   Problem: Skin Integrity: Goal: Risk for impaired skin integrity will decrease Outcome: Progressing

## 2019-11-12 NOTE — Progress Notes (Signed)
Patient ID: Patrick [name], male   DOB: 1955-11-24, 64 y.o.   MRN: [id number]     Advanced Heart Failure Rounding Note  PCP-Cardiologist: Dorris Carnes, MD     Patient Profile   64 y/o male w/ h/o CAD s/p remote CABG in 1997 w/ LIMA-LAD, failed kidney transplant, end-stage renal disease on dialysis admitted w/ CP. LHC w/ severe CAD. Echo w/ reduced EF 35-40% and moderate AI. S/p redo CABG x 3 on 4/1 (SVG-LAD, SVG-PDA, SVG-OM) + bioprosthetic AVR. Post operative course c/b by cardiogenic shock, respiratory failure, VT, and protein malnutrition.    Subjective:    Required BIPAP overnight. RT just removed. Now on 6L [place].   Currently on NE 28, vasopressin 0.03.  MAP stable.    On CVVH for volume removal. Pulling net 20 cc/hr currently.  CVP 7-8 on my measure.   He is a-pacing, has had runs of NSVT and is on amiodarone gtt at 60 mg/hr.   Feels better today, abdominal pain resolved.   Objective:   Weight Range: 101.8 kg Body mass index is 35.68 kg/m.   Vital Signs:   Temp:  [98.6 F (37 C)-98.7 F (37.1 C)] 98.6 F (37 C) (04/17 0400) Pulse Rate:  [44-101] 91 (04/17 0700) Resp:  [14-36] 22 (04/17 0700) BP: (90-119)/(38-45) 119/41 (04/16 1520) SpO2:  [79 %-100 %] 95 % (04/17 0700) Arterial Line BP: (70-138)/(36-57) 103/53 (04/17 0700) FiO2 (%):  [40 %-50 %] 40 % (04/17 0400) Last BM Date: 11/10/19  Weight change: Filed Weights   11/09/19 0500 11/10/19 0500 11/11/19 0355  Weight: 101.9 kg 100.9 kg 101.8 kg    Intake/Output:   Intake/Output Summary (Last 24 hours) at 11/12/2019 0733 Last data filed at 11/12/2019 0700 Gross per 24 hour  Intake 3298.01 ml  Output 4569 ml  Net -1270.99 ml      Physical Exam    General: NAD, Bipap Neck: No JVD, no thyromegaly or thyroid nodule.  Lungs: Clear to auscultation bilaterally with normal respiratory effort. CV: Nondisplaced PMI.  Heart regular S1/S2, no S3/S4, no murmur.  No peripheral edema.   Abdomen: Soft, nontender, no  hepatosplenomegaly, no distention.  Skin: Intact without lesions or rashes.  Neurologic: Alert and oriented x 3.  Psych: Normal affect. Extremities: No clubbing or cyanosis.  HEENT: Normal.    Telemetry   A-paced w/ frequent ventricular ectopy (personally reviewed)  Labs    CBC Recent Labs    11/11/19 0352 11/11/19 0352 11/11/19 0915 11/12/19 0331  WBC 13.5*  --   --  11.2*  HGB 8.6*   < > 8.8* 8.1*  HCT 27.9*   < > 26.0* 26.3*  MCV 94.6  --   --  98.1  PLT 143*  --   --  126*   < > = values in this interval not displayed.   Basic Metabolic Panel Recent Labs    11/11/19 0352 11/11/19 0915 11/11/19 1607 11/12/19 0331  NA 135   < > 135 136  K 4.3   < > 4.4 4.2  CL 104  --  104 102  CO2 23  --  23 24  GLUCOSE 157*  --  179* 155*  BUN 13  --  12 12  CREATININE 2.03*  --  2.01* 1.97*  CALCIUM 7.1*  --  7.0* 7.1*  MG 2.2  --   --  2.2  PHOS 2.0*  --  3.5 2.3*   < > = values in this interval not displayed.  Liver Function Tests Recent Labs    11/11/19 1607 11/12/19 0331  ALBUMIN 1.8* 1.9*   No results for input(s): LIPASE, AMYLASE in the last 72 hours. Cardiac Enzymes No results for input(s): CKTOTAL, CKMB, CKMBINDEX, TROPONINI in the last 72 hours.  BNP: BNP (last 3 results) No results for input(s): BNP in the last 8760 hours.  ProBNP (last 3 results) No results for input(s): PROBNP in the last 8760 hours.   D-Dimer No results for input(s): DDIMER in the last 72 hours. Hemoglobin A1C No results for input(s): HGBA1C in the last 72 hours. Fasting Lipid Panel No results for input(s): CHOL, HDL, LDLCALC, TRIG, CHOLHDL, LDLDIRECT in the last 72 hours. Thyroid Function Tests No results for input(s): TSH, T4TOTAL, T3FREE, THYROIDAB in the last 72 hours.  Invalid input(s): FREET3  Other results:   Imaging    No results found.   Medications:     Scheduled Medications: . sodium chloride   Intravenous Once  . apixaban  5 mg Oral BID  .  bisacodyl  10 mg Oral Daily   Or  . bisacodyl  10 mg Rectal Daily  . chlorhexidine  15 mL Mouth Rinse BID  . Chlorhexidine Gluconate Cloth  6 each Topical Daily  . clopidogrel  75 mg Oral Daily  . darbepoetin (ARANESP) injection - NON-DIALYSIS  60 mcg Subcutaneous Q Sat-1800  . docusate sodium  100 mg Oral Daily  . feeding supplement (ENSURE ENLIVE)  237 mL Oral TID BM  . feeding supplement (PRO-STAT SUGAR FREE 64)  30 mL Oral BID  . insulin aspart  0-9 Units Subcutaneous TID AC & HS  . magic mouthwash w/lidocaine  5 mL Oral QID  . mouth rinse  15 mL Mouth Rinse BID  . mouth rinse  15 mL Mouth Rinse q12n4p  . midodrine  15 mg Oral TID WC  . multivitamin  1 tablet Oral QHS  . pantoprazole  40 mg Oral Daily  . rosuvastatin  10 mg Oral q1800    Infusions: .  prismasol BGK 4/2.5 1 each (11/11/19 1844)  .  prismasol BGK 4/2.5 1 each (11/11/19 1843)  . sodium chloride 10 mL/hr at 11/12/19 0700  . sodium chloride    . amiodarone 60 mg/hr (11/12/19 0700)  . feeding supplement (VITAL AF 1.2 CAL) 1,000 mL (11/10/19 1325)  . heparin 10,000 units/ 20 mL infusion syringe 500 Units/hr (11/10/19 1439)  . meropenem (MERREM) IV Stopped (11/12/19 3300)  . norepinephrine (LEVOPHED) Adult infusion 28 mcg/min (11/12/19 0700)  . prismasol BGK 4/2.5 1 mL (11/11/19 1851)  . vancomycin Stopped (11/11/19 2011)  . vasopressin (PITRESSIN) infusion - *FOR SHOCK* 0.03 Units/min (11/12/19 0700)    PRN Medications: sodium chloride, Place/Maintain arterial line **AND** sodium chloride, acetaminophen, alteplase, dextrose, fentaNYL (SUBLIMAZE) injection, Gerhardt's butt cream, heparin, levalbuterol, metoprolol tartrate, ondansetron (ZOFRAN) IV, oxyCODONE, sodium chloride, sodium chloride flush, traMADol   Assessment/Plan   1. Cardiogenic shock: Post-op.  Echo 4/14 with EF 30-35%, stable aortic valve replacement.  Currently on NE 28, vasopressin 0.03, midodrine 15 tid.  MAP now stable.  Co-ox 61%.  CVP 7-8,  CVVH with net UF 20 cc/hr currently.  - Continue to slowly wean NE as tolerated, eventually vasopressin as well.  Will stay on midodrine.   - Aim for gentle negative fluid balance with CVVH, UF 25-50 cc/hr net.  2. ESRD: CVVH as above.   3. Possible aspiration PNA: He is on empiric vanco/meropenem.  - CXR today.  4. Atrial fibrillation: Paroxysmal.  Currently in NSR (a-paced) on amiodarone.  - Continue amiodarone gtt, can decrease to 30 mg/hr.  - Continue apixaban.  5. Anemia: Hgb 8.1.  Post-op.  Transfuse hgb < 8.  6. CAD: Redo CABG x 3 this admission (SVG-LAD, SVG-PDA, SVG-OM).  No chest pain.  - Continue Plavix and Crestor.  7. VT:  Frequent NSVT, now on Amiodarone gtt - Can decrease to 30 mg/hr today.   CRITICAL CARE Performed by: Loralie Champagne  Total critical care time: 35 minutes  Critical care time was exclusive of separately billable procedures and treating other patients.  Critical care was necessary to treat or prevent imminent or life-threatening deterioration.  Critical care was time spent personally by me on the following activities: development of treatment plan with patient and/or surrogate as well as nursing, discussions with consultants, evaluation of patient's response to treatment, examination of patient, obtaining history from patient or surrogate, ordering and performing treatments and interventions, ordering and review of laboratory studies, ordering and review of radiographic studies, pulse oximetry and re-evaluation of patient's condition.   Length of Stay: [place], MD  11/12/2019, 7:33 AM  Advanced Heart Failure Team Pager [phone] (M-F; 7a - 4p)

## 2019-11-12 NOTE — Progress Notes (Signed)
Patient ID: Patrick [name], male   DOB: 03-Jun-1956, 64 y.o.   MRN: [id number] S: Feeling better and able to UF 1.4 liters overnight.  Breathing and edema have improved.  Able to titrate levophed and amiodarone.  Still on vaso. O:BP (!) 119/41   Pulse (!) 53   Temp 97.8 F (36.6 C) (Oral)   Resp (!) 21   Ht 5' 6.5" (1.689 m)   Wt 101.8 kg   SpO2 100%   BMI 35.68 kg/m   Intake/Output Summary (Last 24 hours) at 11/12/2019 1113 Last data filed at 11/12/2019 1100 Gross per 24 hour  Intake 3525.53 ml  Output 4957 ml  Net -1431.47 ml   Intake/Output: I/O last 3 completed shifts: In: 4750.5 [I.V.:2625.7; NG/GT:1060; IV Piggyback:1064.9] Out: 6295 [Other:5858; Stool:400]  Intake/Output this shift:  Total I/O In: 551.1 [P.O.:110; I.V.:261.2; NG/GT:80; IV Piggyback:99.9] Out: 736 [Other:636; Stool:100] Weight change:  Gen: fatigued but NAD CVS: bradycardic, no rub Resp: occ rhonchi Abd: +BS, soft, NT/ND Ext: no edema  Recent Labs  Lab 11/09/19 0411 11/09/19 0411 11/09/19 1650 11/09/19 1650 11/10/19 0347 11/10/19 0347 11/10/19 1226 11/10/19 1234 11/10/19 1453 11/11/19 0352 11/11/19 0915 11/11/19 1607 11/12/19 0331  NA 134*   < > 134*   < > 135   < > 135 137 136 135 139 135 136  K 4.4   < > 4.8   < > 4.3   < > 4.1 4.2 4.4 4.3 4.0 4.4 4.2  CL 99  --  100  --  101  --   --   --  103 104  --  104 102  CO2 24  --  23  --  22  --   --   --  23 23  --  23 24  GLUCOSE 159*  --  260*  --  155*  --   --   --  140* 157*  --  179* 155*  BUN 13  --  12  --  13  --   --   --  12 13  --  12 12  CREATININE 2.46*  --  2.21*  --  2.21*  --   --   --  2.32* 2.03*  --  2.01* 1.97*  ALBUMIN 2.1*  --  2.0*  --  2.0*  --   --   --  2.0* 1.9*  --  1.8* 1.9*  CALCIUM 7.3*  --  7.0*  --  7.2*  --   --   --  7.1* 7.1*  --  7.0* 7.1*  PHOS 1.9*  --  1.6*  --  3.0  --   --   --  2.4* 2.0*  --  3.5 2.3*   < > = values in this interval not displayed.   Liver Function Tests: Recent Labs  Lab  11/11/19 0352 11/11/19 1607 11/12/19 0331  ALBUMIN 1.9* 1.8* 1.9*   No results for input(s): LIPASE, AMYLASE in the last 168 hours. No results for input(s): AMMONIA in the last 168 hours. CBC: Recent Labs  Lab 11/07/19 0110 11/07/19 0110 11/08/19 0414 11/09/19 0012 11/10/19 0740 11/10/19 1226 11/11/19 0352 11/11/19 0915 11/12/19 0331  WBC 14.7*   < > 14.5*  --  14.1*  --  13.5*  --  11.2*  HGB 8.5*   < > 8.5*   < > 8.1*   < > 8.6* 8.8* 8.1*  HCT 26.0*   < > 27.1*   < > 25.6*   < >  27.9* 26.0* 26.3*  MCV 94.2  --  98.2  --  99.2  --  94.6  --  98.1  PLT 118*   < > 142*  --  143*  --  143*  --  126*   < > = values in this interval not displayed.   Cardiac Enzymes: No results for input(s): CKTOTAL, CKMB, CKMBINDEX, TROPONINI in the last 168 hours. CBG: Recent Labs  Lab 11/11/19 0751 11/11/19 1105 11/11/19 1508 11/11/19 2122 11/12/19 0732  GLUCAP 129* 131* 135* 129* 145*    Iron Studies: No results for input(s): IRON, TIBC, TRANSFERRIN, FERRITIN in the last 72 hours. Studies/Results: DG CHEST PORT 1 VIEW  Result Date: 11/12/2019 CLINICAL DATA:  Chest pain and shortness of breath. EXAM: PORTABLE CHEST 1 VIEW COMPARISON:  Chest x-ray dated 11/08/2019. FINDINGS: Stable cardiomegaly. Tunneled RIGHT-sided PICC line is stable in position with tip at the level of the mid SVC. LEFT sided dialysis catheter is stable in position with tip overlying the RIGHT atrium. Enteric tube passes below the diaphragm. Again noted is central pulmonary vascular congestion and mild bilateral interstitial prominence/edema. Probable bibasilar atelectasis and/or small pleural effusions are stable. No new lung findings. No pneumothorax is seen. Osseous structures about the chest are unremarkable. IMPRESSION: Stable chest x-ray. Persistent central pulmonary vascular congestion and mild bilateral interstitial prominence/edema suggesting mild volume overload/CHF. Persistent probable bibasilar atelectasis  and/or small pleural effusions. Electronically Signed   By: Franki Cabot M.D.   On: 11/12/2019 11:05   . sodium chloride   Intravenous Once  . apixaban  5 mg Oral BID  . bisacodyl  10 mg Oral Daily   Or  . bisacodyl  10 mg Rectal Daily  . chlorhexidine  15 mL Mouth Rinse BID  . Chlorhexidine Gluconate Cloth  6 each Topical Daily  . clopidogrel  75 mg Oral Daily  . darbepoetin (ARANESP) injection - NON-DIALYSIS  60 mcg Subcutaneous Q Sat-1800  . docusate sodium  100 mg Oral Daily  . feeding supplement (ENSURE ENLIVE)  237 mL Oral TID BM  . feeding supplement (PRO-STAT SUGAR FREE 64)  30 mL Oral BID  . insulin aspart  0-9 Units Subcutaneous TID AC & HS  . magic mouthwash w/lidocaine  5 mL Oral QID  . mouth rinse  15 mL Mouth Rinse BID  . mouth rinse  15 mL Mouth Rinse q12n4p  . midodrine  15 mg Oral TID WC  . multivitamin  1 tablet Oral QHS  . pantoprazole  40 mg Oral Daily  . rosuvastatin  10 mg Oral q1800    BMET    Component Value Date/Time   NA 136 11/12/2019 0331   NA 139 11/30/2017 1008   K 4.2 11/12/2019 0331   CL 102 11/12/2019 0331   CO2 24 11/12/2019 0331   GLUCOSE 155 (H) 11/12/2019 0331   BUN 12 11/12/2019 0331   BUN 48 (H) 11/30/2017 1008   CREATININE 1.97 (H) 11/12/2019 0331   CALCIUM 7.1 (L) 11/12/2019 0331   GFRNONAA 35 (L) 11/12/2019 0331   GFRAA 40 (L) 11/12/2019 0331   CBC    Component Value Date/Time   WBC 11.2 (H) 11/12/2019 0331   RBC 2.68 (L) 11/12/2019 0331   HGB 8.1 (L) 11/12/2019 0331   HGB 11.9 (L) 11/30/2017 1008   HCT 26.3 (L) 11/12/2019 0331   HCT 35.3 (L) 11/30/2017 1008   PLT 126 (L) 11/12/2019 0331   PLT 197 11/30/2017 1008   MCV 98.1 11/12/2019 0331  MCV 92 11/30/2017 1008   MCH 30.2 11/12/2019 0331   MCHC 30.8 11/12/2019 0331   RDW 21.6 (H) 11/12/2019 0331   RDW 16.8 (H) 11/30/2017 1008   LYMPHSABS 1.0 10/24/2019 0737   MONOABS 0.7 10/24/2019 0737   EOSABS 0.2 10/24/2019 0737   BASOSABS 0.1 10/24/2019 0737     [facility] 4h 99.5 2K/3.5Ca P4 TDC Hep 3000 - mircera 75 last 3/22 - hect 1   Assessment/ Plan: 1. Abdominal pain- improved.  1. CT of abdomen was unrevealing. 2. NSTEMI/CAD s/p redo CABG 08/02/05 complicated by hypotension and volume overload. 3. Cardiogenic shock - recurrent , able to wean down levophed after vasopressin was added 11/11/19. UF as tolerated.  4. Resp failure - suspected pulm edema d/t vol excess.improved with UF of 1.6 liters overnight.  5. ESRD - usual HD MWF, sp CRRT 4/1due to ongoing issues with hypotension and volume overload. Was doing well on CVVHD untiltheeveningof 11/06/19 when he developeda fib/RVR and VTach with hypotension. CVVHD was stoppedthat night and then resumed 11/07/19. He has done well overnight but no UF.  1. Did well overnight.  Increase UF goal to 20-40 ml/hr as bp and HR tolerlate.  6. Atrial Fibrillation- on amiodarone with decreased HR. 7. Anemia abl/ ckd - tsat 16%, ferr 1293. Due for esa on 4/5,startedAranesp60 qwk SQ 1st dose 4/10.If continues to fall will need blood transfusion. 8. S/p AVR- good valve function per ECHO. 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 Will replete with IV sodium phos 10. Protein malnutrition- severe with low albumin.Improved after givenIV albumin 4/13/21as well to help with bp and HR with UF.  Donetta Potts, MD [employer] [phone]

## 2019-11-13 DIAGNOSIS — I214 Non-ST elevation (NSTEMI) myocardial infarction: Secondary | ICD-10-CM | POA: Diagnosis not present

## 2019-11-13 DIAGNOSIS — R570 Cardiogenic shock: Secondary | ICD-10-CM | POA: Diagnosis not present

## 2019-11-13 LAB — RENAL FUNCTION PANEL
Albumin: 1.9 g/dL — ABNORMAL LOW (ref 3.5–5.0)
Albumin: 1.8 g/dL — ABNORMAL LOW (ref 3.5–5.0)
Anion gap: 6 (ref 5–15)
Anion gap: 7 (ref 5–15)
BUN: 16 mg/dL (ref 8–23)
BUN: 17 mg/dL (ref 8–23)
CO2: 25 mmol/L (ref 22–32)
CO2: 25 mmol/L (ref 22–32)
Calcium: 6.9 mg/dL — ABNORMAL LOW (ref 8.9–10.3)
Calcium: 7 mg/dL — ABNORMAL LOW (ref 8.9–10.3)
Chloride: 102 mmol/L (ref 98–111)
Chloride: 102 mmol/L (ref 98–111)
Creatinine, Ser: 2.18 mg/dL — ABNORMAL HIGH (ref 0.61–1.24)
Creatinine, Ser: 1.98 mg/dL — ABNORMAL HIGH (ref 0.61–1.24)
GFR calc Af Amer: 36 mL/min — ABNORMAL LOW (ref >60)
GFR calc Af Amer: 40 mL/min — ABNORMAL LOW (ref >60)
GFR calc non Af Amer: 31 mL/min — ABNORMAL LOW (ref >60)
GFR calc non Af Amer: 34 mL/min — ABNORMAL LOW (ref >60)
Glucose, Bld: 167 mg/dL — ABNORMAL HIGH (ref 70–99)
Glucose, Bld: 145 mg/dL — ABNORMAL HIGH (ref 70–99)
Phosphorus: 2.7 mg/dL (ref 2.5–4.6)
Phosphorus: 2.4 mg/dL — ABNORMAL LOW (ref 2.5–4.6)
Potassium: 4.2 mmol/L (ref 3.5–5.1)
Potassium: 4.3 mmol/L (ref 3.5–5.1)
Sodium: 133 mmol/L — ABNORMAL LOW (ref 135–145)
Sodium: 134 mmol/L — ABNORMAL LOW (ref 135–145)

## 2019-11-13 LAB — CBC
HCT: 25.3 % — ABNORMAL LOW (ref 39.0–52.0)
Hemoglobin: 7.7 g/dL — ABNORMAL LOW (ref 13.0–17.0)
MCH: 29.6 pg (ref 26.0–34.0)
MCHC: 30.4 g/dL (ref 30.0–36.0)
MCV: 97.3 fL (ref 80.0–100.0)
Platelets: 124 10*3/uL — ABNORMAL LOW (ref 150–400)
RBC: 2.6 MIL/uL — ABNORMAL LOW (ref 4.22–5.81)
RDW: 21.8 % — ABNORMAL HIGH (ref 11.5–15.5)
WBC: 10.4 10*3/uL (ref 4.0–10.5)
nRBC: 0 % (ref 0.0–0.2)

## 2019-11-13 LAB — POCT I-STAT 7, (LYTES, BLD GAS, ICA,H+H)
Acid-Base Excess: 2 mmol/L (ref 0.0–2.0)
Bicarbonate: 26 mmol/L (ref 20.0–28.0)
Calcium, Ion: 1 mmol/L — ABNORMAL LOW (ref 1.15–1.40)
HCT: 26 % — ABNORMAL LOW (ref 39.0–52.0)
Hemoglobin: 8.8 g/dL — ABNORMAL LOW (ref 13.0–17.0)
O2 Saturation: 99 %
Patient temperature: 98.5
Potassium: 4.4 mmol/L (ref 3.5–5.1)
Sodium: 137 mmol/L (ref 135–145)
TCO2: 27 mmol/L (ref 22–32)
pCO2 arterial: 38.3 mmHg (ref 32.0–48.0)
pH, Arterial: 7.44 (ref 7.350–7.450)
pO2, Arterial: 126 mmHg — ABNORMAL HIGH (ref 83.0–108.0)

## 2019-11-13 LAB — GLUCOSE, CAPILLARY
Glucose-Capillary: 146 mg/dL — ABNORMAL HIGH (ref 70–99)
Glucose-Capillary: 116 mg/dL — ABNORMAL HIGH (ref 70–99)
Glucose-Capillary: 137 mg/dL — ABNORMAL HIGH (ref 70–99)
Glucose-Capillary: 130 mg/dL — ABNORMAL HIGH (ref 70–99)
Glucose-Capillary: 122 mg/dL — ABNORMAL HIGH (ref 70–99)
Glucose-Capillary: 128 mg/dL — ABNORMAL HIGH (ref 70–99)

## 2019-11-13 LAB — COOXEMETRY PANEL
Carboxyhemoglobin: 1.5 % (ref 0.5–1.5)
Methemoglobin: 1.4 % (ref 0.0–1.5)
O2 Saturation: 57.6 %
Total hemoglobin: 8.2 g/dL — ABNORMAL LOW (ref 12.0–16.0)

## 2019-11-13 LAB — MAGNESIUM: Magnesium: 2.4 mg/dL (ref 1.7–2.4)

## 2019-11-13 MED ORDER — AMIODARONE HCL IN DEXTROSE 360-4.14 MG/200ML-% IV SOLN
30.0000 mg/h | INTRAVENOUS | Status: DC
  Administered 2019-11-13 – 2019-11-16 (×6): 30 mg/h via INTRAVENOUS
  Filled 2019-11-13 (×6): qty 200

## 2019-11-13 MED ORDER — SODIUM CHLORIDE 0.9% FLUSH
10.0000 mL | Freq: Two times a day (BID) | INTRAVENOUS | Status: DC
  Administered 2019-11-14: 10 mL
  Administered 2019-11-15: 30 mL
  Administered 2019-11-15 – 2019-12-12 (×36): 10 mL

## 2019-11-13 MED ORDER — AMIODARONE HCL 200 MG PO TABS
200.0000 mg | ORAL_TABLET | Freq: Two times a day (BID) | ORAL | Status: DC
  Administered 2019-11-13: 200 mg via ORAL
  Filled 2019-11-13: qty 1

## 2019-11-13 MED ORDER — SODIUM CHLORIDE 0.9% FLUSH: 10.0000 mL | INTRAVENOUS | Status: DC | PRN

## 2019-11-13 NOTE — Plan of Care (Signed)
  Problem: Cardiac: Goal: Ability to achieve and maintain adequate cardiopulmonary perfusion will improve Outcome: Progressing   Problem: Clinical Measurements: Goal: Respiratory complications will improve Outcome: Progressing Goal: Cardiovascular complication will be avoided Outcome: Progressing   Problem: Activity: Goal: Risk for activity intolerance will decrease Outcome: Not Progressing   Problem: Nutrition: Goal: Adequate nutrition will be maintained Outcome: Not Progressing   Problem: Coping: Goal: Level of anxiety will decrease Outcome: Progressing   Problem: Safety: Goal: Ability to remain free from injury will improve Outcome: Progressing   Problem: Skin Integrity: Goal: Risk for impaired skin integrity will decrease Outcome: Progressing

## 2019-11-13 NOTE — Progress Notes (Signed)
Patient ID: Patrick [name], male   DOB: Feb 18, 1956, 64 y.o.   MRN: [id number]     Advanced Heart Failure Rounding Note  PCP-Cardiologist: Dorris Carnes, MD     Patient Profile   64 y/o male w/ h/o CAD s/p remote CABG in 1997 w/ LIMA-LAD, failed kidney transplant, end-stage renal disease on dialysis admitted w/ CP. LHC w/ severe CAD. Echo w/ reduced EF 35-40% and moderate AI. S/p redo CABG x 3 on 4/1 (SVG-LAD, SVG-PDA, SVG-OM) + bioprosthetic AVR. Post operative course c/b by cardiogenic shock, respiratory failure, VT, and protein malnutrition.    Subjective:    Off BIPAP. Remains on vaso and NE. NE weaned 28 ->19.   On CVVH for volume removal. Pulling net 20 cc/hr currently.  Down 0.6L overnight. Weight recorded as down 8 pounds. (suspect yesterday's weight may be inaccurate)  CVP 6 Personally reviewed  He is a-pacing at 90. On amiodarone gtt at 30 mg/hr. NSVT resolved overnight.  Feels ok today. Nausea improved. Denies SOB, orthopnea or PND.   Objective:   Weight Range: 98 kg Body mass index is 34.35 kg/m.   Vital Signs:   Temp:  [98.3 F (36.8 C)-102 F (38.9 C)] 98.3 F (36.8 C) (04/18 0754) Pulse Rate:  [66-101] 89 (04/18 0700) Resp:  [19-34] 19 (04/18 0700) BP: (90-130)/(51-66) 130/57 (04/18 0400) SpO2:  [9 %-100 %] 98 % (04/18 0700) Arterial Line BP: (71-147)/(43-80) 88/80 (04/18 0700) FiO2 (%):  [40 %] 40 % (04/17 1714) Weight:  [98 kg] 98 kg (04/18 0441) Last BM Date: 11/13/19  Weight change: Filed Weights   11/10/19 0500 11/11/19 0355 11/13/19 0441  Weight: 100.9 kg 101.8 kg 98 kg    Intake/Output:   Intake/Output Summary (Last 24 hours) at 11/13/2019 1020 Last data filed at 11/13/2019 1000 Gross per 24 hour  Intake 2792.92 ml  Output 3427 ml  Net -634.08 ml      Physical Exam    General:  Lying in bed . No resp difficulty HEENT: normal Neck: supple. JVP 5-6. Carotids 2+ bilat; no bruits. No lymphadenopathy or thryomegaly appreciated. RIJ  catheter Cor: PMI nondisplaced. Sternal wound ok Regular rate & rhythm. No rubs, gallops or murmurs. Lungs: clear Abdomen: soft, nontender, nondistended. No hepatosplenomegaly. No bruits or masses. Good bowel sounds. Extremities: no cyanosis, clubbing, rash, edema Neuro: alert & orientedx3, cranial nerves grossly intact. moves all 4 extremities w/o difficulty. Affect pleasant   Telemetry   A-paced at 90. NSVT resolved (personally reviewed)  Labs    CBC Recent Labs    11/12/19 0331 11/12/19 0331 11/13/19 0321 11/13/19 0614  WBC 11.2*  --  10.4  --   HGB 8.1*   < > 7.7* 8.8*  HCT 26.3*   < > 25.3* 26.0*  MCV 98.1  --  97.3  --   PLT 126*  --  124*  --    < > = values in this interval not displayed.   Basic Metabolic Panel Recent Labs    11/12/19 0331 11/12/19 0331 11/12/19 1809 11/12/19 1809 11/13/19 0321 11/13/19 0614  NA 136   < > 134*   < > 133* 137  K 4.2   < > 4.2   < > 4.2 4.4  CL 102   < > 100  --  102  --   CO2 24   < > 23  --  25  --   GLUCOSE 155*   < > 189*  --  167*  --  BUN 12   < > 14  --  16  --   CREATININE 1.97*   < > 2.14*  --  2.18*  --   CALCIUM 7.1*   < > 7.0*  --  6.9*  --   MG 2.2  --   --   --  2.4  --   PHOS 2.3*   < > 3.0  --  2.7  --    < > = values in this interval not displayed.   Liver Function Tests Recent Labs    11/12/19 1809 11/13/19 0321  ALBUMIN 1.7* 1.9*   No results for input(s): LIPASE, AMYLASE in the last 72 hours. Cardiac Enzymes No results for input(s): CKTOTAL, CKMB, CKMBINDEX, TROPONINI in the last 72 hours.  BNP: BNP (last 3 results) No results for input(s): BNP in the last 8760 hours.  ProBNP (last 3 results) No results for input(s): PROBNP in the last 8760 hours.   D-Dimer No results for input(s): DDIMER in the last 72 hours. Hemoglobin A1C No results for input(s): HGBA1C in the last 72 hours. Fasting Lipid Panel No results for input(s): CHOL, HDL, LDLCALC, TRIG, CHOLHDL, LDLDIRECT in the last 72  hours. Thyroid Function Tests No results for input(s): TSH, T4TOTAL, T3FREE, THYROIDAB in the last 72 hours.  Invalid input(s): FREET3  Other results:   Imaging    No results found.   Medications:     Scheduled Medications: . sodium chloride   Intravenous Once  . amiodarone  200 mg Oral BID  . apixaban  5 mg Oral BID  . bisacodyl  10 mg Oral Daily   Or  . bisacodyl  10 mg Rectal Daily  . chlorhexidine  15 mL Mouth Rinse BID  . Chlorhexidine Gluconate Cloth  6 each Topical Daily  . clopidogrel  75 mg Oral Daily  . darbepoetin (ARANESP) injection - NON-DIALYSIS  60 mcg Subcutaneous Q Sat-1800  . docusate sodium  100 mg Oral Daily  . feeding supplement (ENSURE ENLIVE)  237 mL Oral TID BM  . feeding supplement (PRO-STAT SUGAR FREE 64)  30 mL Oral BID  . insulin aspart  0-9 Units Subcutaneous TID AC & HS  . magic mouthwash w/lidocaine  5 mL Oral QID  . mouth rinse  15 mL Mouth Rinse BID  . mouth rinse  15 mL Mouth Rinse q12n4p  . midodrine  15 mg Oral TID WC  . multivitamin  1 tablet Oral QHS  . pantoprazole  40 mg Oral Daily  . rosuvastatin  10 mg Oral q1800  . sodium chloride flush  10-40 mL Intracatheter Q12H    Infusions: .  prismasol BGK 4/2.5 400 mL/hr at 11/12/19 0753  .  prismasol BGK 4/2.5 1 each (11/11/19 1843)  . sodium chloride 10 mL/hr at 11/13/19 1000  . sodium chloride    . amiodarone 30 mg/hr (11/13/19 1000)  . feeding supplement (VITAL AF 1.2 CAL) 1,000 mL (11/10/19 1325)  . norepinephrine (LEVOPHED) Adult infusion 20 mcg/min (11/13/19 1000)  . prismasol BGK 4/2.5 1,800 mL/hr at 11/12/19 1448  . vasopressin (PITRESSIN) infusion - *FOR SHOCK* 0.03 Units/min (11/13/19 1000)    PRN Medications: sodium chloride, Place/Maintain arterial line **AND** sodium chloride, acetaminophen, alteplase, dextrose, fentaNYL (SUBLIMAZE) injection, Gerhardt's butt cream, heparin, levalbuterol, metoprolol tartrate, ondansetron (ZOFRAN) IV, oxyCODONE, sodium chloride,  sodium chloride flush, sodium chloride flush, traMADol   Assessment/Plan   1. Cardiogenic shock: Post-op.  Echo 4/14 with EF 30-35%, stable aortic valve replacement.  Currently  on NE 28 -> 20, vasopressin 0.03, midodrine 15 tid.  MAP now stable.  Co-ox 58%.  CVP 5-6, CVVH with net UF 20 cc/hr currently.  - Continue to slowly wean NE as tolerated, eventually vasopressin as well.  Will stay on midodrine.   -Within 3 pounds of his baseline weight of 213.  Continue to aim for gentle negative fluid balance with CVVH, UF 25-50 cc/hr net as BP tolerates.  Likely can start keeping even soon. 2. ESRD: CVVH as above.   3. Possible aspiration PNA: He is on empiric vanco/meropenem.  - CXR4/17 stable with mild pulmonary edema. Personally reviewed  4. Atrial fibrillation: Paroxysmal.  Currently in NSR (a-paced) on amiodarone.  - I would continue amiodarone gtt at 30 while on pressors.  Discussed with Dr. Prescott Gum at the bedside. - Continue apixaban. No bleeding  5. Anemia, post-op: Hgb 7.7  Transfused to 8.8  Transfuse hgb < 8.  6. CAD: Redo CABG x 3 this admission on 4/1 (SVG-LAD, SVG-PDA, SVG-OM).  No s/s ischemia - Continue Plavix and Crestor.  7. VT:  Frequent NSVT, now on Amiodarone gtt improvement. - Keep K> 4.0 Mg > 2.0  - Off b-blocker with shock 8 Acute hypoxic respiratory failure - due to pulmonary edema and aspiration PNA - now off BIPAP during the day. Wears at night.   CRITICAL CARE Performed by: Glori Bickers  Total critical care time: 35 minutes  Critical care time was exclusive of separately billable procedures and treating other patients.  Critical care was necessary to treat or prevent imminent or life-threatening deterioration.  Critical care was time spent personally by me on the following activities: development of treatment plan with patient and/or surrogate as well as nursing, discussions with consultants, evaluation of patient's response to treatment, examination of  patient, obtaining history from patient or surrogate, ordering and performing treatments and interventions, ordering and review of laboratory studies, ordering and review of radiographic studies, pulse oximetry and re-evaluation of patient's condition.   Length of Stay: [place], MD  11/13/2019, 10:20 AM  Advanced Heart Failure Team Pager [phone] (M-F; 7a - 4p)

## 2019-11-13 NOTE — Progress Notes (Signed)
Patient ID: Patrick [name], male   DOB: 12/15/1955, 64 y.o.   MRN: [id number] S: Feeling better today and able to UF 0.6 liters overnight.  Still on vaso but able to wean levophed. O:BP (!) 130/57 (BP Location: Other (Comment)) Comment (BP Location): aline  Pulse 89   Temp 98.3 F (36.8 C) (Oral)   Resp 19   Ht 5' 6.5" (1.689 m)   Wt 98 kg   SpO2 98%   BMI 34.35 kg/m   Intake/Output Summary (Last 24 hours) at 11/13/2019 1018 Last data filed at 11/13/2019 1000 Gross per 24 hour  Intake 2792.92 ml  Output 3427 ml  Net -634.08 ml   Intake/Output: I/O last 3 completed shifts: In: 4623.1 [P.O.:110; I.V.:2486.6; NG/GT:1135; IV Piggyback:891.5] Out: 1638 [GTXMI:6803; Stool:260]  Intake/Output this shift:  Total I/O In: 370.1 [P.O.:30; I.V.:170.1; NG/GT:170] Out: 371 [Other:371] Weight change:  Gen: NAD CVS: no rub Resp: cta Abd: benign Ext: no edema  Recent Labs  Lab 11/10/19 0347 11/10/19 1226 11/10/19 1453 11/10/19 1453 11/11/19 0352 11/11/19 0915 11/11/19 1607 11/12/19 0331 11/12/19 1809 11/13/19 0321 11/13/19 0614  NA 135   < > 136   < > 135 139 135 136 134* 133* 137  K 4.3   < > 4.4   < > 4.3 4.0 4.4 4.2 4.2 4.2 4.4  CL 101  --  103  --  104  --  104 102 100 102  --   CO2 22  --  23  --  23  --  23 24 23 25   --   GLUCOSE 155*  --  140*  --  157*  --  179* 155* 189* 167*  --   BUN 13  --  12  --  13  --  12 12 14 16   --   CREATININE 2.21*  --  2.32*  --  2.03*  --  2.01* 1.97* 2.14* 2.18*  --   ALBUMIN 2.0*  --  2.0*  --  1.9*  --  1.8* 1.9* 1.7* 1.9*  --   CALCIUM 7.2*  --  7.1*  --  7.1*  --  7.0* 7.1* 7.0* 6.9*  --   PHOS 3.0  --  2.4*  --  2.0*  --  3.5 2.3* 3.0 2.7  --    < > = values in this interval not displayed.   Liver Function Tests: Recent Labs  Lab 11/12/19 0331 11/12/19 1809 11/13/19 0321  ALBUMIN 1.9* 1.7* 1.9*   No results for input(s): LIPASE, AMYLASE in the last 168 hours. No results for input(s): AMMONIA in the last 168  hours. CBC: Recent Labs  Lab 11/08/19 0414 11/09/19 0012 11/10/19 0740 11/10/19 1226 11/11/19 0352 11/11/19 0915 11/12/19 0331 11/13/19 0321 11/13/19 0614  WBC 14.5*  --  14.1*  --  13.5*  --  11.2* 10.4  --   HGB 8.5*   < > 8.1*   < > 8.6*   < > 8.1* 7.7* 8.8*  HCT 27.1*   < > 25.6*   < > 27.9*   < > 26.3* 25.3* 26.0*  MCV 98.2  --  99.2  --  94.6  --  98.1 97.3  --   PLT 142*  --  143*  --  143*  --  126* 124*  --    < > = values in this interval not displayed.   Cardiac Enzymes: No results for input(s): CKTOTAL, CKMB, CKMBINDEX, TROPONINI in the last 168 hours.  CBG: Recent Labs  Lab 11/12/19 1138 11/12/19 1535 11/12/19 2144 11/13/19 0740 11/13/19 0828  GLUCAP 151* 156* 146* 137* 116*    Iron Studies: No results for input(s): IRON, TIBC, TRANSFERRIN, FERRITIN in the last 72 hours. Studies/Results: DG CHEST PORT 1 VIEW  Result Date: 11/12/2019 CLINICAL DATA:  Chest pain and shortness of breath. EXAM: PORTABLE CHEST 1 VIEW COMPARISON:  Chest x-ray dated 11/08/2019. FINDINGS: Stable cardiomegaly. Tunneled RIGHT-sided PICC line is stable in position with tip at the level of the mid SVC. LEFT sided dialysis catheter is stable in position with tip overlying the RIGHT atrium. Enteric tube passes below the diaphragm. Again noted is central pulmonary vascular congestion and mild bilateral interstitial prominence/edema. Probable bibasilar atelectasis and/or small pleural effusions are stable. No new lung findings. No pneumothorax is seen. Osseous structures about the chest are unremarkable. IMPRESSION: Stable chest x-ray. Persistent central pulmonary vascular congestion and mild bilateral interstitial prominence/edema suggesting mild volume overload/CHF. Persistent probable bibasilar atelectasis and/or small pleural effusions. Electronically Signed   By: Franki Cabot M.D.   On: 11/12/2019 11:05   . sodium chloride   Intravenous Once  . amiodarone  200 mg Oral BID  . apixaban  5 mg  Oral BID  . bisacodyl  10 mg Oral Daily   Or  . bisacodyl  10 mg Rectal Daily  . chlorhexidine  15 mL Mouth Rinse BID  . Chlorhexidine Gluconate Cloth  6 each Topical Daily  . clopidogrel  75 mg Oral Daily  . darbepoetin (ARANESP) injection - NON-DIALYSIS  60 mcg Subcutaneous Q Sat-1800  . docusate sodium  100 mg Oral Daily  . feeding supplement (ENSURE ENLIVE)  237 mL Oral TID BM  . feeding supplement (PRO-STAT SUGAR FREE 64)  30 mL Oral BID  . insulin aspart  0-9 Units Subcutaneous TID AC & HS  . magic mouthwash w/lidocaine  5 mL Oral QID  . mouth rinse  15 mL Mouth Rinse BID  . mouth rinse  15 mL Mouth Rinse q12n4p  . midodrine  15 mg Oral TID WC  . multivitamin  1 tablet Oral QHS  . pantoprazole  40 mg Oral Daily  . rosuvastatin  10 mg Oral q1800  . sodium chloride flush  10-40 mL Intracatheter Q12H    BMET    Component Value Date/Time   NA 137 11/13/2019 0614   NA 139 11/30/2017 1008   K 4.4 11/13/2019 0614   CL 102 11/13/2019 0321   CO2 25 11/13/2019 0321   GLUCOSE 167 (H) 11/13/2019 0321   BUN 16 11/13/2019 0321   BUN 48 (H) 11/30/2017 1008   CREATININE 2.18 (H) 11/13/2019 0321   CALCIUM 6.9 (L) 11/13/2019 0321   GFRNONAA 31 (L) 11/13/2019 0321   GFRAA 36 (L) 11/13/2019 0321   CBC    Component Value Date/Time   WBC 10.4 11/13/2019 0321   RBC 2.60 (L) 11/13/2019 0321   HGB 8.8 (L) 11/13/2019 0614   HGB 11.9 (L) 11/30/2017 1008   HCT 26.0 (L) 11/13/2019 0614   HCT 35.3 (L) 11/30/2017 1008   PLT 124 (L) 11/13/2019 0321   PLT 197 11/30/2017 1008   MCV 97.3 11/13/2019 0321   MCV 92 11/30/2017 1008   MCH 29.6 11/13/2019 0321   MCHC 30.4 11/13/2019 0321   RDW 21.8 (H) 11/13/2019 0321   RDW 16.8 (H) 11/30/2017 1008   LYMPHSABS 1.0 10/24/2019 0737   MONOABS 0.7 10/24/2019 0737   EOSABS 0.2 10/24/2019 0737   BASOSABS  0.1 10/24/2019 0737    [facility] 4h 99.5 2K/3.5Ca P4 TDC Hep 3000 - mircera 75 last 3/22 - hect 1   Assessment/  Plan: 1. Abdominal pain-improved.  1. CT of abdomenwas unrevealing. 2. NSTEMI/CAD s/p redo CABG 11/04/15 complicated by hypotension and volume overload. 3. Cardiogenic shock - recurrent ,able to wean down levophed after vasopressin was added 11/11/19. UF as tolerated. 4. Resp failure - suspected pulm edema d/t vol excess.improved with UF of 1.6 liters overnight.  5. ESRD - usual HD MWF, sp CRRT 4/1due to ongoing issues with hypotension and volume overload. Was doing well on CVVHD untiltheeveningof 11/06/19 when he developeda fib/RVR and VTach with hypotension. CVVHD was stoppedthat night and then resumed 11/07/19. He has done well overnight but no UF.  1. Did well overnight.  continue UF goal to 20-40 ml/hr as bp and HR tolerlate.  6. Atrial Fibrillation- on amiodarone with decreased HR. 7. Anemia abl/ ckd - tsat 16%, ferr 1293. Due for esa on 4/5,startedAranesp60 qwk SQ 1st dose 4/10.If continues to fall will need blood transfusion. 8. S/p AVR- good valve function per ECHO. 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 Will replete with IV sodium phos 10. Protein malnutrition- severe with low albumin.Improved after givenIV albumin 4/13/21as well to help with bp and HR with UF.  Donetta Potts, MD [employer] [phone]

## 2019-11-13 NOTE — Plan of Care (Signed)
  Problem: Activity: Goal: Capacity to carry out activities will improve Outcome: Not Progressing   Problem: Nutrition: Goal: Adequate nutrition will be maintained Outcome: Not Progressing   Problem: Pain Managment: Goal: General experience of comfort will improve Outcome: Completed/Met

## 2019-11-13 NOTE — Progress Notes (Signed)
CT surgery p.m. Rounds  Patient had stable day Remains in sinus rhythm Negative balance so far today [-700] Continue current care

## 2019-11-14 DIAGNOSIS — R570 Cardiogenic shock: Secondary | ICD-10-CM | POA: Diagnosis not present

## 2019-11-14 DIAGNOSIS — N186 End stage renal disease: Secondary | ICD-10-CM | POA: Diagnosis not present

## 2019-11-14 DIAGNOSIS — I214 Non-ST elevation (NSTEMI) myocardial infarction: Secondary | ICD-10-CM | POA: Diagnosis not present

## 2019-11-14 LAB — TYPE AND SCREEN
ABO/RH(D): O POS
Antibody Screen: POSITIVE
DAT, IgG: POSITIVE
Unit division: 0

## 2019-11-14 LAB — RENAL FUNCTION PANEL
Albumin: 1.8 g/dL — ABNORMAL LOW (ref 3.5–5.0)
Albumin: 1.7 g/dL — ABNORMAL LOW (ref 3.5–5.0)
Anion gap: 9 (ref 5–15)
Anion gap: 7 (ref 5–15)
BUN: 18 mg/dL (ref 8–23)
BUN: 19 mg/dL (ref 8–23)
CO2: 25 mmol/L (ref 22–32)
CO2: 24 mmol/L (ref 22–32)
Calcium: 7.2 mg/dL — ABNORMAL LOW (ref 8.9–10.3)
Calcium: 7 mg/dL — ABNORMAL LOW (ref 8.9–10.3)
Chloride: 99 mmol/L (ref 98–111)
Chloride: 102 mmol/L (ref 98–111)
Creatinine, Ser: 1.97 mg/dL — ABNORMAL HIGH (ref 0.61–1.24)
Creatinine, Ser: 1.98 mg/dL — ABNORMAL HIGH (ref 0.61–1.24)
GFR calc Af Amer: 40 mL/min — ABNORMAL LOW (ref >60)
GFR calc Af Amer: 40 mL/min — ABNORMAL LOW (ref >60)
GFR calc non Af Amer: 35 mL/min — ABNORMAL LOW (ref >60)
GFR calc non Af Amer: 34 mL/min — ABNORMAL LOW (ref >60)
Glucose, Bld: 154 mg/dL — ABNORMAL HIGH (ref 70–99)
Glucose, Bld: 188 mg/dL — ABNORMAL HIGH (ref 70–99)
Phosphorus: 2.1 mg/dL — ABNORMAL LOW (ref 2.5–4.6)
Phosphorus: 1.6 mg/dL — ABNORMAL LOW (ref 2.5–4.6)
Potassium: 4.5 mmol/L (ref 3.5–5.1)
Potassium: 4.2 mmol/L (ref 3.5–5.1)
Sodium: 133 mmol/L — ABNORMAL LOW (ref 135–145)
Sodium: 133 mmol/L — ABNORMAL LOW (ref 135–145)

## 2019-11-14 LAB — POCT I-STAT 7, (LYTES, BLD GAS, ICA,H+H)
Acid-Base Excess: 1 mmol/L (ref 0.0–2.0)
Bicarbonate: 25.4 mmol/L (ref 20.0–28.0)
Calcium, Ion: 1.03 mmol/L — ABNORMAL LOW (ref 1.15–1.40)
HCT: 24 % — ABNORMAL LOW (ref 39.0–52.0)
Hemoglobin: 8.2 g/dL — ABNORMAL LOW (ref 13.0–17.0)
O2 Saturation: 98 %
Patient temperature: 98.5
Potassium: 4.5 mmol/L (ref 3.5–5.1)
Sodium: 137 mmol/L (ref 135–145)
TCO2: 26 mmol/L (ref 22–32)
pCO2 arterial: 37.5 mmHg (ref 32.0–48.0)
pH, Arterial: 7.438 (ref 7.350–7.450)
pO2, Arterial: 99 mmHg (ref 83.0–108.0)

## 2019-11-14 LAB — CBC
HCT: 24.7 % — ABNORMAL LOW (ref 39.0–52.0)
Hemoglobin: 7.6 g/dL — ABNORMAL LOW (ref 13.0–17.0)
MCH: 30.2 pg (ref 26.0–34.0)
MCHC: 30.8 g/dL (ref 30.0–36.0)
MCV: 98 fL (ref 80.0–100.0)
Platelets: UNDETERMINED 10*3/uL (ref 150–400)
RBC: 2.52 MIL/uL — ABNORMAL LOW (ref 4.22–5.81)
RDW: 21.6 % — ABNORMAL HIGH (ref 11.5–15.5)
WBC: 7.6 10*3/uL (ref 4.0–10.5)
nRBC: 0 % (ref 0.0–0.2)

## 2019-11-14 LAB — BPAM RBC
Blood Product Expiration Date: 202105122359
Unit Type and Rh: 5100

## 2019-11-14 LAB — MAGNESIUM: Magnesium: 2.4 mg/dL (ref 1.7–2.4)

## 2019-11-14 LAB — CULTURE, BLOOD (SINGLE)
Culture: NO GROWTH
Special Requests: ADEQUATE

## 2019-11-14 LAB — COOXEMETRY PANEL
Carboxyhemoglobin: 1.3 % (ref 0.5–1.5)
Methemoglobin: 1 % (ref 0.0–1.5)
O2 Saturation: 55.4 %
Total hemoglobin: 7.3 g/dL — ABNORMAL LOW (ref 12.0–16.0)

## 2019-11-14 LAB — GLUCOSE, CAPILLARY
Glucose-Capillary: 141 mg/dL — ABNORMAL HIGH (ref 70–99)
Glucose-Capillary: 140 mg/dL — ABNORMAL HIGH (ref 70–99)
Glucose-Capillary: 163 mg/dL — ABNORMAL HIGH (ref 70–99)
Glucose-Capillary: 162 mg/dL — ABNORMAL HIGH (ref 70–99)

## 2019-11-14 LAB — PREPARE RBC (CROSSMATCH)

## 2019-11-14 LAB — HEMOGLOBIN AND HEMATOCRIT, BLOOD
HCT: 26.9 % — ABNORMAL LOW (ref 39.0–52.0)
Hemoglobin: 8.2 g/dL — ABNORMAL LOW (ref 13.0–17.0)

## 2019-11-14 MED ORDER — VITAL AF 1.2 CAL PO LIQD
1000.0000 mL | ORAL | Status: DC
  Administered 2019-11-14: 1000 mL

## 2019-11-14 MED ORDER — SODIUM CHLORIDE 0.9% IV SOLUTION: Freq: Once | INTRAVENOUS | Status: AC

## 2019-11-14 MED ORDER — VITAL 1.5 CAL PO LIQD
1000.0000 mL | ORAL | Status: DC
  Administered 2019-11-14 – 2019-11-17 (×3): 1000 mL
  Filled 2019-11-14 (×3): qty 1000

## 2019-11-14 MED ORDER — PRO-STAT SUGAR FREE PO LIQD
60.0000 mL | Freq: Three times a day (TID) | ORAL | Status: DC
  Administered 2019-11-14 – 2019-11-17 (×9): 60 mL
  Filled 2019-11-14 (×9): qty 60

## 2019-11-14 NOTE — Progress Notes (Signed)
Patient ID: Patrick [name], male   DOB: Dec 09, 1955, 64 y.o.   MRN: [id number] EVENING ROUNDS NOTE :     [address]       [place] [address]             [phone]                 18 Days Post-Op Procedure(s) (LRB): REDO CORONARY ARTERY BYPASS GRAFTING TIMES 3  USING LEFT GREATER SAPHENOUS LEG VEIN HARVESTED ENDOSCOPICALLY(CABG) (N/A) TRANSESOPHAGEAL ECHOCARDIOGRAM (TEE) (N/A) AORTIC VALVE REPLACEMENT (AVR) USING EDWARDS INTUITY 23 MM AORTIC VALVE. (N/A) Endovein Harvest Of Greater Saphenous Vein (Left)  Total Length of Stay:  LOS: 21 days  BP (!) 110/45   Pulse (!) 156   Temp 98.9 F (37.2 C) (Oral)   Resp (!) 32   Ht 5' 6.5" (1.689 m)   Wt 97.3 kg   SpO2 99%   BMI 34.10 kg/m   .Intake/Output      04/18 0701 - 04/19 0700 04/19 0701 - 04/20 0700   P.O. 30    I.V. (mL/kg) 1334.7 (13.7) 449 (4.6)   Blood  396.7   NG/GT 815 266.5   IV Piggyback     Total Intake(mL/kg) 2179.7 (22.4) 1112.1 (11.4)   Urine (mL/kg/hr) 0 (0)    Other 3105 1115   Stool 100    Total Output 3205 1115   Net -1025.4 -2.9          .  prismasol BGK 4/2.5 400 mL/hr at 11/14/19 1012  .  prismasol BGK 4/2.5 1 each (11/11/19 1843)  . sodium chloride 10 mL/hr at 11/14/19 1500  . sodium chloride    . amiodarone 30 mg/hr (11/14/19 1500)  . feeding supplement (VITAL 1.5 CAL) 1,000 mL (11/14/19 1635)  . norepinephrine (LEVOPHED) Adult infusion 18 mcg/min (11/14/19 1500)  . prismasol BGK 4/2.5 1,600 mL/hr at 11/14/19 1417  . vasopressin (PITRESSIN) infusion - *FOR SHOCK* 0.03 Units/min (11/14/19 1500)     Lab Results  Component Value Date   WBC 7.6 11/14/2019   HGB 8.2 (L) 11/14/2019   HCT 24.0 (L) 11/14/2019   PLT PLATELET CLUMPS NOTED ON SMEAR, UNABLE TO ESTIMATE 11/14/2019   GLUCOSE 154 (H) 11/14/2019   CHOL 122 02/15/2019   TRIG 85 02/15/2019   HDL 46 02/15/2019   LDLCALC 59 02/15/2019   ALT 54 (H) 11/01/2019   AST 102 (H) 11/01/2019   NA 137 11/14/2019   K 4.5  11/14/2019   CL 99 11/14/2019   CREATININE 1.97 (H) 11/14/2019   BUN 18 11/14/2019   CO2 25 11/14/2019   TSH 5.113 (H) 05/25/2016   INR 1.4 (H) 10/28/2019   HGBA1C 6.2 (H) 10/27/2019    Still on levaphen and vasopressin Stood with pt today   Grace Isaac MD  Beeper [phone] Office [phone] 11/14/2019 5:27 PM

## 2019-11-14 NOTE — Progress Notes (Signed)
[facility] Progress Note  Subjective: seen in room, pt groggy , responsive, ne tneg 1 L yest  Vitals:   11/14/19 1200 11/14/19 1206 11/14/19 1215 11/14/19 1230  BP:      Pulse:      Resp: (!) 22  (!) 25 (!) 27  Temp:  98.5 F (36.9 C)    TempSrc:  Oral    SpO2:      Weight:      Height:        Exam:  alert, chron ill appearing   No jvd  Chest cta ant/lat   Cor reg     Abd obese ntnd    Ext no leg edema, 1+ bilat uE edema    NF, Ox3,gen weakness        Dialysis: [name] MWF 4h 99.5 2K/3.5Ca P4 TDC Hep 3000 - mircera 75 last 3/22 - hect 1    Assessment/ Plan: 1. NSTEMI/CAD - s/p redo CABG 07/28/89 complicated by hypotension and volume overload 2. Cardiogenic shock - recurrent ,able to wean down levophed after vasopressin was added 11/11/19. Holding UF for now per Cardiology to wean pressors 3. Resp failure - improved 4. Volume - down to dry wt 5. ESRD - usual HD MWF.  CRRT started 4/1due to ongoing issues with hypotension and volume overload.  - keep even for now w/ CRRT 6. Atrial Fibrillation- on amiodarone with decreased HR 7. Anemia abl/ ckd - tsat 16%, ferr 1293. Due for esa on 4/5,startedAranesp60 qwk SQ 1st dose 4/10.If Hb continues to drop will need blood transfusion. 8. S/p AVR- good valve function per ECHO. 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 SP IV sodium phos 10. Protein malnutrition- severe with low albumin.Improved after givenIV albumin 4/13/21as well to help with bp and HR with UF.    Patrick [name] 11/14/2019, 1:50 PM   Recent Labs  Lab 11/13/19 0614 11/13/19 1604 11/14/19 0428 11/14/19 1223  K   < > 4.3 4.5 4.5  BUN  --  17 18  --   CREATININE  --  1.98* 1.97*  --   CALCIUM  --  7.0* 7.2*  --   PHOS  --  2.4* 2.1*  --   HGB   < >  --  7.6* 8.2*   < > = values in this interval not displayed.   Inpatient medications: . sodium chloride   Intravenous Once  . apixaban  5 mg Oral BID  .  bisacodyl  10 mg Oral Daily   Or  . bisacodyl  10 mg Rectal Daily  . chlorhexidine  15 mL Mouth Rinse BID  . Chlorhexidine Gluconate Cloth  6 each Topical Daily  . clopidogrel  75 mg Oral Daily  . darbepoetin (ARANESP) injection - NON-DIALYSIS  60 mcg Subcutaneous Q Sat-1800  . docusate sodium  100 mg Oral Daily  . feeding supplement (ENSURE ENLIVE)  237 mL Oral TID BM  . feeding supplement (PRO-STAT SUGAR FREE 64)  30 mL Oral BID  . insulin aspart  0-9 Units Subcutaneous TID AC & HS  . magic mouthwash w/lidocaine  5 mL Oral QID  . mouth rinse  15 mL Mouth Rinse q12n4p  . midodrine  15 mg Oral TID WC  . multivitamin  1 tablet Oral QHS  . pantoprazole  40 mg Oral Daily  . rosuvastatin  10 mg Oral q1800  . sodium chloride flush  10-40 mL Intracatheter Q12H   .  prismasol  BGK 4/2.5 400 mL/hr at 11/14/19 1012  .  prismasol BGK 4/2.5 1 each (11/11/19 1843)  . sodium chloride 10 mL/hr at 11/14/19 1200  . sodium chloride    . amiodarone 30 mg/hr (11/14/19 1200)  . feeding supplement (VITAL AF 1.2 CAL) 1,000 mL (11/14/19 0849)  . norepinephrine (LEVOPHED) Adult infusion 19 mcg/min (11/14/19 1200)  . prismasol BGK 4/2.5 1,800 mL/hr at 11/14/19 1011  . vasopressin (PITRESSIN) infusion - *FOR SHOCK* 0.03 Units/min (11/14/19 1200)   sodium chloride, Place/Maintain arterial line **AND** sodium chloride, acetaminophen, alteplase, dextrose, fentaNYL (SUBLIMAZE) injection, Gerhardt's butt cream, heparin, levalbuterol, metoprolol tartrate, ondansetron (ZOFRAN) IV, oxyCODONE, sodium chloride, sodium chloride flush, sodium chloride flush, traMADol

## 2019-11-14 NOTE — Progress Notes (Signed)
Occupational Therapy Treatment Patient Details Name: Patrick [name] MRN: [id number] DOB: 10/02/1955 Today's Date: 11/14/2019    History of present illness Pt is a 64 yo male s/p re-do CABGx3 with Aortic valve replacement. Pt has been on CRRT but removed 4/11 due to hypotension and VTach with return to CRRT 4/12 pt with ileus. PMHx: ESRD MWF, HTN, PNA, T2DM, MI.   OT comments  Pt presents supine in bed agreeable to working with therapies. Pt continues to require two person assist for completion of functional transfers, tolerating x3 sit<>stand trials today with up to +2 maxA (completing from bed egress to chair position). Pt fatigues with activity requiring seated rest breaks but remains motivated to return to his PLOF. He continues to require up to [place] for LB and toileting ADL, Max HR noted in the 110s with activity, max RR noted 30 end of session. Pt requiring cues for recall of sternal precautions with cueing provided intermittently during mobility tasks this session. Continue to recommend CIR level therapies at time of discharge. Will continue to follow acutely.   Follow Up Recommendations  CIR    Equipment Recommendations  Other (comment)(TBD)          Precautions / Restrictions Precautions Precautions: Fall;Sternal;Other (comment) Precaution Comments: external pacer, flexiseal, CRRT, cortrak Restrictions RUE Partial Weight Bearing Percentage or Pounds: Sternal Precautions       Mobility Bed Mobility Overal bed mobility: Needs Assistance Bed Mobility: Rolling;Sit to Supine;Supine to Sit Rolling: Min assist   Supine to sit: HOB elevated     General bed mobility comments: pt with min assist to roll bil for pericare and linen change. Utilized foot egress positioning of bed to transition from supine to and from sit  Transfers Overall transfer level: Needs assistance Equipment used: 2 person hand held assist Transfers: Sit to/from Stand Sit to Stand: Mod assist;Max  assist;+2 physical assistance         General transfer comment: pt stood x 3 trials with 1st and 3rd trial requiring max +2 assist with cues for hand placement, assist for anterior translation and rise. Max cues for trunk and hip extension with physical assist to achieve. 2nd trial mod +2 assist. Pt stood grossly 10 sec 1st and 2nd trial and 45 sec 3rd trial with heavy reliance on 2 person assist to maintain standing    Balance Overall balance assessment: Needs assistance Sitting-balance support: Feet supported Sitting balance-Leahy Scale: Good Sitting balance - Comments: EOB in foot egress position with minguard   Standing balance support: Bilateral upper extremity supported Standing balance-Leahy Scale: Zero                             ADL either performed or assessed with clinical judgement   ADL Overall ADL's : Needs assistance/impaired Eating/Feeding: NPO Eating/Feeding Details (indicate cue type and reason): coretrak Grooming: Wash/dry face;Maximal assistance;Bed level Grooming Details (indicate cue type and reason): increased assist due to fatigue levels as pt completing task end of session                             Functional mobility during ADLs: Moderate assistance;Maximal assistance;+2 for physical assistance(fluctuated mod-max+2, x3 sit<>stand)                         Cognition Arousal/Alertness: Awake/alert Behavior During Therapy: Flat affect Overall Cognitive Status: Impaired/Different from baseline Area  of Impairment: Memory                     Memory: Decreased recall of precautions         General Comments: Pt able to recall no push/pull precautions needs cues for hand placement and to maintain        Exercises Exercises: General Upper Extremity General Exercises - Upper Extremity Elbow Flexion: AROM;Both;10 reps;Seated Elbow Extension: AROM;Both;10 reps;Seated Wrist Flexion: AROM;Both;10 reps;Seated Wrist  Extension: AROM;Both;10 reps;Seated Digit Composite Flexion: AROM;Both;10 reps Composite Extension: AROM;Both;10 reps;Seated General Exercises - Lower Extremity Long Arc Quad: AROM;10 reps;Seated;Both Hip Flexion/Marching: AROM;Both;Seated;10 reps   Shoulder Instructions       General Comments RN present to manage CRRT lines    Pertinent Vitals/ Pain       Pain Assessment: Faces Pain Score: 3  Faces Pain Scale: Hurts a little bit Pain Location: sacrum Pain Descriptors / Indicators: Sore Pain Intervention(s): Limited activity within patient's tolerance;Monitored during session;Repositioned  Home Living                                          Prior Functioning/Environment              Frequency  Min 2X/week        Progress Toward Goals  OT Goals(current goals can now be found in the care plan section)  Progress towards OT goals: Progressing toward goals  Acute Rehab OT Goals Patient Stated Goal: to go home OT Goal Formulation: With patient/family Time For Goal Achievement: 11/29/19 Potential to Achieve Goals: Good  Plan Discharge plan remains appropriate    Co-evaluation    PT/OT/SLP Co-Evaluation/Treatment: Yes Reason for Co-Treatment: Complexity of the patient's impairments (multi-system involvement);For patient/therapist safety;To address functional/ADL transfers PT goals addressed during session: Mobility/safety with mobility;Strengthening/ROM OT goals addressed during session: Strengthening/ROM;ADL's and self-care      AM-PAC OT "6 Clicks" Daily Activity     Outcome Measure   Help from another person eating meals?: Total(NPO) Help from another person taking care of personal grooming?: A Lot Help from another person toileting, which includes using toliet, bedpan, or urinal?: Total Help from another person bathing (including washing, rinsing, drying)?: A Lot Help from another person to put on and taking off regular upper body  clothing?: A Lot Help from another person to put on and taking off regular lower body clothing?: Total 6 Click Score: 9    End of Session Equipment Utilized During Treatment: Oxygen  OT Visit Diagnosis: Unsteadiness on feet (R26.81);Muscle weakness (generalized) (M62.81);Pain;Other symptoms and signs involving cognitive function Pain - part of body: (chest, sacrum)   Activity Tolerance Patient tolerated treatment well   Patient Left in bed;with call bell/phone within reach;with nursing/sitter in room;with family/visitor present   Nurse Communication Mobility status        Time: 1031-1056 OT Time Calculation (min): 25 min  Charges: OT General Charges $OT Visit: 1 Visit OT Treatments $Therapeutic Activity: 8-22 mins  Lou Cal, OT Acute Rehabilitation Services Pager [phone] Office [place] 11/14/2019, 2:09 PM

## 2019-11-14 NOTE — Progress Notes (Signed)
Nutrition Follow-up  DOCUMENTATION CODES:   Not applicable  INTERVENTION:   Tube Feeding:  Vital 1.5 @ 30 ml/hr via Cortrak 60 ml Prostat TID  Provides: 1680 kcals, 139 grams protein, 550 ml free water. Meets 73% kcal needs and 100% of protein needs.   Continue Ensure Enlive po TID, each supplement provides 350 kcal and 20 grams of protein  Continue Magic cup BID with meals, each supplement provides 290 kcal and 9 grams of protein  Continue Rena-Vit daily  Continue Regular diet as tolerated  NUTRITION DIAGNOSIS:   Inadequate oral intake related to acute illness as evidenced by NPO status.  Being addressed via TF, supplements  GOAL:   Patient will meet greater than or equal to 90% of their needs  Progressing  MONITOR:   PO intake, Diet advancement, Supplement acceptance, Labs, Weight trends, Skin  REASON FOR ASSESSMENT:   Ventilator    ASSESSMENT:   64 yo male admitted with NSTEMI and found to have mutlivessel CAD and underwent CABG on 4/1, ESRD/HD requiring CRRT. PMH includes ESRD on HD s/p renal transplant, CAD s/p CABG, CHF, DM  3/29 Admission 3/30 Cardiac Cath 4/01 CABG with IABP placement(removed 4/3), Intubated 4/02 CRRT initiated 4/04 Extubated 4/05 Cortrak placed,tip in stomach;okay to feed gastric per RD discussion with Dr. Orvan Seen 4/07 CRRT discontinued, ileus, NPO, Cortrak pulled out by pt 4/08 CL diet in AM, FL diet in PM 4/09 iHD with 1.3 L removed 4/10 CRRT re-initiated 4/11 Diet advanced to Dysphagia III 4/13 Diet advanced to Heart Healthy, Carb Modified 4/14 Cortrak placed 4/15 TF off due to abdominal pain  Continues on pressors. Remains on CRRT- UF goal 20-40 ml/hr. Rectal tube pulled. TF restarted per MD this am. Noted new wounds on sacrum and toes. Meal completions charted as 0-50% for pt's last eight meals. Wife reports pt skipping meals due to tiredness after working with PT. Pt complains of sores in his mouth that prevent him from  eating. Magic Mouthwash helps slightly. Discussed importance of increasing PO intake before Cortrak can be removed. Pt willing to increase Ensure supplements to twice daily.   Pt request to keep TF at current rate. Change formula to better meet needs with CRRT and new wounds.   Admission weight: 94.1 kg  Current weight: 97.3 kg   I/O: -10, 769 ml since 4/5 CRRT: 3,105 ml x 24 hrs  Stool: 100 ml x 24 hrs   Drips: levophed, vasopressin Medications: dulcolax, aranesp, colace, SS novolog, rena-vit Labs: Phosphorous 2.1 (L)   Diet Order:   Diet Order            Diet regular Room service appropriate? Yes; Fluid consistency: Thin  Diet effective now              EDUCATION NEEDS:   No education needs have been identified at this time  Skin:  Skin Assessment: Skin Integrity Issues: Skin Integrity Issues:: Stage II, Unstageable DTI: R toe Stage II: sacrum Unstageable: R toe, L toe  Last BM:  4/19  Height:   Ht Readings from Last 1 Encounters:  10/28/19 5' 6.5" (1.689 m)    Weight:   Wt Readings from Last 1 Encounters:  11/14/19 97.3 kg    BMI:  Body mass index is 34.1 kg/m.  Estimated Nutritional Needs:   Kcal:  7341-9379  Protein:  120-140 gm  Fluid:  1000 plus UOP  Mariana Single RD, LDN Clinical Nutrition Pager listed in [place]

## 2019-11-14 NOTE — Progress Notes (Signed)
Patient ID: Patrick [name], male   DOB: 06/19/1956, 64 y.o.   MRN: [id number]     Advanced Heart Failure Rounding Note  PCP-Cardiologist: Dorris Carnes, MD     Patient Profile   64 y/o male w/ h/o CAD s/p remote CABG in 1997 w/ LIMA-LAD, failed kidney transplant, end-stage renal disease on dialysis admitted w/ CP. LHC w/ severe CAD. Echo w/ reduced EF 35-40% and moderate AI. S/p redo CABG x 3 on 4/1 (SVG-LAD, SVG-PDA, SVG-OM) + bioprosthetic AVR. Post operative course c/b by cardiogenic shock, respiratory failure, VT, and protein malnutrition.    Subjective:    Off BIPAP. Remains on vasopressin 0.03 and NE 20.  On CVVH for volume removal. Pulling net 40 cc/hr currently.  Weight down 2 lbs.   CVP 6-7  He is a-pacing at 90. On amiodarone gtt at 30 mg/hr. Underlying rhythm looks like NSR with PACs  No complaints this morning.   Objective:   Weight Range: 97.3 kg Body mass index is 34.1 kg/m.   Vital Signs:   Temp:  [98 F (36.7 C)-98.8 F (37.1 C)] 98.8 F (37.1 C) (04/19 0400) Pulse Rate:  [85-91] 90 (04/19 0700) Resp:  [15-29] 24 (04/19 0700) BP: (96-102)/(42-47) 102/47 (04/19 0400) SpO2:  [64 %-100 %] 100 % (04/19 0700) Arterial Line BP: (89-121)/(39-67) 106/52 (04/19 0700) Weight:  [97.3 kg] 97.3 kg (04/19 0435) Last BM Date: 11/13/19  Weight change: Filed Weights   11/11/19 0355 11/13/19 0441 11/14/19 0435  Weight: 101.8 kg 98 kg 97.3 kg    Intake/Output:   Intake/Output Summary (Last 24 hours) at 11/14/2019 0728 Last data filed at 11/14/2019 0700 Gross per 24 hour  Intake 2179.65 ml  Output 3205 ml  Net -1025.35 ml      Physical Exam    General: NAD Neck: No JVD, no thyromegaly or thyroid nodule.  Lungs: Clear to auscultation bilaterally with normal respiratory effort. CV: Nondisplaced PMI.  Heart regular S1/S2, no S3/S4, no murmur.  No peripheral edema.   Abdomen: Soft, nontender, no hepatosplenomegaly, no distention.  Skin: Intact without lesions or  rashes.  Neurologic: Alert and oriented x 3.  Psych: Normal affect. Extremities: No clubbing or cyanosis.  HEENT: Normal.    Telemetry   A-paced at 90 (personally reviewed).  Labs    CBC Recent Labs    11/13/19 0321 11/13/19 0321 11/13/19 0614 11/14/19 0428  WBC 10.4  --   --  7.6  HGB 7.7*   < > 8.8* 7.6*  HCT 25.3*   < > 26.0* 24.7*  MCV 97.3  --   --  98.0  PLT 124*  --   --  PLATELET CLUMPS NOTED ON SMEAR, UNABLE TO ESTIMATE   < > = values in this interval not displayed.   Basic Metabolic Panel Recent Labs    11/13/19 0321 11/13/19 0614 11/13/19 1604 11/14/19 0428  NA 133*   < > 134* 133*  K 4.2   < > 4.3 4.5  CL 102   < > 102 99  CO2 25   < > 25 25  GLUCOSE 167*   < > 145* 154*  BUN 16   < > 17 18  CREATININE 2.18*   < > 1.98* 1.97*  CALCIUM 6.9*   < > 7.0* 7.2*  MG 2.4  --   --  2.4  PHOS 2.7   < > 2.4* 2.1*   < > = values in this interval not displayed.   Liver Function  Tests Recent Labs    11/13/19 1604 11/14/19 0428  ALBUMIN 1.8* 1.8*   No results for input(s): LIPASE, AMYLASE in the last 72 hours. Cardiac Enzymes No results for input(s): CKTOTAL, CKMB, CKMBINDEX, TROPONINI in the last 72 hours.  BNP: BNP (last 3 results) No results for input(s): BNP in the last 8760 hours.  ProBNP (last 3 results) No results for input(s): PROBNP in the last 8760 hours.   D-Dimer No results for input(s): DDIMER in the last 72 hours. Hemoglobin A1C No results for input(s): HGBA1C in the last 72 hours. Fasting Lipid Panel No results for input(s): CHOL, HDL, LDLCALC, TRIG, CHOLHDL, LDLDIRECT in the last 72 hours. Thyroid Function Tests No results for input(s): TSH, T4TOTAL, T3FREE, THYROIDAB in the last 72 hours.  Invalid input(s): FREET3  Other results:   Imaging    No results found.   Medications:     Scheduled Medications: . sodium chloride   Intravenous Once  . apixaban  5 mg Oral BID  . bisacodyl  10 mg Oral Daily   Or  .  bisacodyl  10 mg Rectal Daily  . chlorhexidine  15 mL Mouth Rinse BID  . Chlorhexidine Gluconate Cloth  6 each Topical Daily  . clopidogrel  75 mg Oral Daily  . darbepoetin (ARANESP) injection - NON-DIALYSIS  60 mcg Subcutaneous Q Sat-1800  . docusate sodium  100 mg Oral Daily  . feeding supplement (ENSURE ENLIVE)  237 mL Oral TID BM  . feeding supplement (PRO-STAT SUGAR FREE 64)  30 mL Oral BID  . insulin aspart  0-9 Units Subcutaneous TID AC & HS  . magic mouthwash w/lidocaine  5 mL Oral QID  . mouth rinse  15 mL Mouth Rinse BID  . mouth rinse  15 mL Mouth Rinse q12n4p  . midodrine  15 mg Oral TID WC  . multivitamin  1 tablet Oral QHS  . pantoprazole  40 mg Oral Daily  . rosuvastatin  10 mg Oral q1800  . sodium chloride flush  10-40 mL Intracatheter Q12H    Infusions: .  prismasol BGK 4/2.5 400 mL/hr at 11/12/19 0753  .  prismasol BGK 4/2.5 1 each (11/11/19 1843)  . sodium chloride 10 mL/hr at 11/14/19 0700  . sodium chloride    . amiodarone 30 mg/hr (11/14/19 0700)  . feeding supplement (VITAL AF 1.2 CAL) 1,000 mL (11/10/19 1325)  . norepinephrine (LEVOPHED) Adult infusion 20 mcg/min (11/14/19 0700)  . prismasol BGK 4/2.5 1,800 mL/hr at 11/13/19 1658  . vasopressin (PITRESSIN) infusion - *FOR SHOCK* 0.03 Units/min (11/14/19 0700)    PRN Medications: sodium chloride, Place/Maintain arterial line **AND** sodium chloride, acetaminophen, alteplase, dextrose, fentaNYL (SUBLIMAZE) injection, Gerhardt's butt cream, heparin, levalbuterol, metoprolol tartrate, ondansetron (ZOFRAN) IV, oxyCODONE, sodium chloride, sodium chloride flush, sodium chloride flush, traMADol   Assessment/Plan   1. Cardiogenic shock: Post-op.  Echo 4/14 with EF 30-35%, stable aortic valve replacement.  Currently on NE 20, vasopressin 0.03, midodrine 15 tid.  MAP now stable.  Co-ox 55%.  CVP 7, CVVH with net UF 40 cc/hr currently.  - Continue to slowly wean NE as tolerated, eventually vasopressin as well.  Will  stay on midodrine.   - He is at his baseline weight, keep CVVH running net even to facilitate weaning of pressors. 2. ESRD: CVVH as above.   3. Possible aspiration PNA: He has completed empiric vanco/meropenem. Afebrile. - CXR4/17 stable with mild pulmonary edema. Personally reviewed  4. Atrial fibrillation: Paroxysmal.  Currently in NSR (a-paced)  on amiodarone.  - I would continue amiodarone gtt at 30 while on pressors.   - Will get ECG while off pacing to check underlying rhythm (think NSR with PACs).  - Continue apixaban. No bleeding  5. Anemia, post-op: Hgb 7.6.  Transfuse hgb < 8, 1 unit today.  6. CAD: Redo CABG x 3 this admission on 4/1 (SVG-LAD, SVG-PDA, SVG-OM).   - Continue Plavix and Crestor.  7. VT:  Frequent NSVT, now on Amiodarone gtt improvement. - Keep K> 4.0 Mg > 2.0  - Off b-blocker with shock 8 Acute hypoxic respiratory failure: due to pulmonary edema and aspiration PNA - now off BIPAP during the day. Wears at night.   CRITICAL CARE Performed by: Loralie Champagne  Total critical care time: 35 minutes  Critical care time was exclusive of separately billable procedures and treating other patients.  Critical care was necessary to treat or prevent imminent or life-threatening deterioration.  Critical care was time spent personally by me on the following activities: development of treatment plan with patient and/or surrogate as well as nursing, discussions with consultants, evaluation of patient's response to treatment, examination of patient, obtaining history from patient or surrogate, ordering and performing treatments and interventions, ordering and review of laboratory studies, ordering and review of radiographic studies, pulse oximetry and re-evaluation of patient's condition.   Length of Stay: 85  Loralie Champagne, MD  11/14/2019, 7:28 AM  Advanced Heart Failure Team Pager [phone] (M-F; 7a - 4p)

## 2019-11-14 NOTE — Progress Notes (Signed)
Physical Therapy Treatment Patient Details Name: Patrick [name] MRN: [id number] DOB: 07/09/1956 Today's Date: 11/14/2019    History of Present Illness Pt is a 64 yo male s/p re-do CABGx3 with Aortic valve replacement. Pt has been on CRRT but removed 4/11 due to hypotension and VTach with return to CRRT 4/12 pt with ileus. PMHx: ESRD MWF, HTN, PNA, T2DM, MI.    PT Comments    Pt remains on CRRT and willing to mobilize. Placed pt in chair position with foot egress for 3 standing trials. Co session with OT to maximize pt function. Pt educated for continued bil LE HEP in bed and throughout the day as well as benefit of chair position with nursing assist to increase postural strength and function.   Pt on 7L HFNC with inaccurate pulse ox reading throughout HR 92-110    Follow Up Recommendations  CIR;Supervision/Assistance - 24 hour     Equipment Recommendations  Rolling walker with 5" wheels    Recommendations for Other Services       Precautions / Restrictions Precautions Precautions: Fall;Sternal;Other (comment) Precaution Comments: external pacer, flexiseal, CRRT, cortrak Restrictions Weight Bearing Restrictions: Yes RUE Weight Bearing: Partial weight bearing RUE Partial Weight Bearing Percentage or Pounds: Sternal Precautions LUE Weight Bearing: Partial weight bearing LUE Partial Weight Bearing Percentage or Pounds: Sternal Precautions    Mobility  Bed Mobility Overal bed mobility: Needs Assistance Bed Mobility: Rolling;Sit to Supine;Supine to Sit Rolling: Min assist   Supine to sit: HOB elevated     General bed mobility comments: pt with min assist to roll bil for pericare and linen change. Utilized foot egress positioning of bed to transition from supine to and from sit  Transfers Overall transfer level: Needs assistance Equipment used: 2 person hand held assist Transfers: Sit to/from Stand Sit to Stand: Mod assist;Max assist         General transfer comment:  pt stood x 3 trials with 1st and 3rd trial requiring max +2 assist with cues for hand placement, assist for anterior translation and rise. Max cues for trunk and hip extension with physical assist to achieve. 2nd trial mod +2 assist. Pt stood grossly 10 sec 1st and 2nd trial and 45 sec 3rd trial with heavy reliance on 2 person assist to maintain standing  Ambulation/Gait             General Gait Details: unable due to lines and weakness   Stairs             Wheelchair Mobility    Modified Rankin (Stroke Patients Only)       Balance Overall balance assessment: Needs assistance   Sitting balance-Leahy Scale: Good Sitting balance - Comments: EOB in foot egress position with minguard   Standing balance support: Bilateral upper extremity supported Standing balance-Leahy Scale: Zero                              Cognition Arousal/Alertness: Awake/alert Behavior During Therapy: Flat affect Overall Cognitive Status: Within Functional Limits for tasks assessed                       Memory: Decreased recall of precautions         General Comments: Pt able to recall no push/pull precautions needs cues for hand placement and to maintain      Exercises General Exercises - Lower Extremity Long Arc Quad: AROM;10 reps;Seated;Both Hip Flexion/Marching: AROM;Both;Seated;10  reps    General Comments        Pertinent Vitals/Pain Pain Score: 3  Pain Location: sacrum Pain Descriptors / Indicators: Sore Pain Intervention(s): Limited activity within patient's tolerance    Home Living                      Prior Function            PT Goals (current goals can now be found in the care plan section) Progress towards PT goals: Progressing toward goals    Frequency    Min 3X/week      PT Plan Current plan remains appropriate    Co-evaluation PT/OT/SLP Co-Evaluation/Treatment: Yes Reason for Co-Treatment: Complexity of the patient's  impairments (multi-system involvement);For patient/therapist safety PT goals addressed during session: Mobility/safety with mobility;Strengthening/ROM        AM-PAC PT "6 Clicks" Mobility   Outcome Measure  Help needed turning from your back to your side while in a flat bed without using bedrails?: A Little Help needed moving from lying on your back to sitting on the side of a flat bed without using bedrails?: A Lot Help needed moving to and from a bed to a chair (including a wheelchair)?: Total Help needed standing up from a chair using your arms (e.g., wheelchair or bedside chair)?: Total Help needed to walk in hospital room?: Total Help needed climbing 3-5 steps with a railing? : Total 6 Click Score: 9    End of Session Equipment Utilized During Treatment: Oxygen;Gait belt Activity Tolerance: Patient limited by fatigue Patient left: in bed;with call bell/phone within reach;with family/visitor present;with nursing/sitter in room Nurse Communication: Mobility status PT Visit Diagnosis: Other abnormalities of gait and mobility (R26.89);Muscle weakness (generalized) (M62.81)     Time: 6812-7517 PT Time Calculation (min) (ACUTE ONLY): 26 min  Charges:  $Therapeutic Activity: 8-22 mins                     Dreshaun Stene P, PT Acute Rehabilitation Services Pager: [phone] Office: [phone]    Bryar Rennie B Aithan Farrelly 11/14/2019, 12:45 PM

## 2019-11-15 ENCOUNTER — Inpatient Hospital Stay (HOSPITAL_COMMUNITY): Payer: Medicare HMO

## 2019-11-15 DIAGNOSIS — I214 Non-ST elevation (NSTEMI) myocardial infarction: Secondary | ICD-10-CM | POA: Diagnosis not present

## 2019-11-15 DIAGNOSIS — N186 End stage renal disease: Secondary | ICD-10-CM | POA: Diagnosis not present

## 2019-11-15 LAB — CBC
HCT: 26.5 % — ABNORMAL LOW (ref 39.0–52.0)
Hemoglobin: 8.1 g/dL — ABNORMAL LOW (ref 13.0–17.0)
MCH: 28.6 pg (ref 26.0–34.0)
MCHC: 30.6 g/dL (ref 30.0–36.0)
MCV: 93.6 fL (ref 80.0–100.0)
Platelets: 119 10*3/uL — ABNORMAL LOW (ref 150–400)
RBC: 2.83 MIL/uL — ABNORMAL LOW (ref 4.22–5.81)
RDW: 22 % — ABNORMAL HIGH (ref 11.5–15.5)
WBC: 7.6 10*3/uL (ref 4.0–10.5)
nRBC: 0.3 % — ABNORMAL HIGH (ref 0.0–0.2)

## 2019-11-15 LAB — BPAM RBC
Blood Product Expiration Date: 202105122359
ISSUE DATE / TIME: 202104191400
Unit Type and Rh: 5100

## 2019-11-15 LAB — RENAL FUNCTION PANEL
Albumin: 1.7 g/dL — ABNORMAL LOW (ref 3.5–5.0)
Albumin: 1.7 g/dL — ABNORMAL LOW (ref 3.5–5.0)
Anion gap: 8 (ref 5–15)
Anion gap: 5 (ref 5–15)
BUN: 20 mg/dL (ref 8–23)
BUN: 18 mg/dL (ref 8–23)
CO2: 23 mmol/L (ref 22–32)
CO2: 26 mmol/L (ref 22–32)
Calcium: 6.9 mg/dL — ABNORMAL LOW (ref 8.9–10.3)
Calcium: 7 mg/dL — ABNORMAL LOW (ref 8.9–10.3)
Chloride: 101 mmol/L (ref 98–111)
Chloride: 103 mmol/L (ref 98–111)
Creatinine, Ser: 2.07 mg/dL — ABNORMAL HIGH (ref 0.61–1.24)
Creatinine, Ser: 2.05 mg/dL — ABNORMAL HIGH (ref 0.61–1.24)
GFR calc Af Amer: 38 mL/min — ABNORMAL LOW (ref >60)
GFR calc Af Amer: 38 mL/min — ABNORMAL LOW (ref >60)
GFR calc non Af Amer: 33 mL/min — ABNORMAL LOW (ref >60)
GFR calc non Af Amer: 33 mL/min — ABNORMAL LOW (ref >60)
Glucose, Bld: 180 mg/dL — ABNORMAL HIGH (ref 70–99)
Glucose, Bld: 173 mg/dL — ABNORMAL HIGH (ref 70–99)
Phosphorus: 1.5 mg/dL — ABNORMAL LOW (ref 2.5–4.6)
Phosphorus: 1.9 mg/dL — ABNORMAL LOW (ref 2.5–4.6)
Potassium: 4.3 mmol/L (ref 3.5–5.1)
Potassium: 4.3 mmol/L (ref 3.5–5.1)
Sodium: 132 mmol/L — ABNORMAL LOW (ref 135–145)
Sodium: 134 mmol/L — ABNORMAL LOW (ref 135–145)

## 2019-11-15 LAB — TYPE AND SCREEN
ABO/RH(D): O POS
Antibody Screen: POSITIVE
Unit division: 0

## 2019-11-15 LAB — COOXEMETRY PANEL
Carboxyhemoglobin: 2 % — ABNORMAL HIGH (ref 0.5–1.5)
Carboxyhemoglobin: 1.8 % — ABNORMAL HIGH (ref 0.5–1.5)
Methemoglobin: 1.3 % (ref 0.0–1.5)
Methemoglobin: 1.1 % (ref 0.0–1.5)
O2 Saturation: 99.5 %
O2 Saturation: 57.2 %
Total hemoglobin: 9.1 g/dL — ABNORMAL LOW (ref 12.0–16.0)
Total hemoglobin: 8.5 g/dL — ABNORMAL LOW (ref 12.0–16.0)

## 2019-11-15 LAB — MAGNESIUM: Magnesium: 2.3 mg/dL (ref 1.7–2.4)

## 2019-11-15 LAB — GLUCOSE, CAPILLARY
Glucose-Capillary: 156 mg/dL — ABNORMAL HIGH (ref 70–99)
Glucose-Capillary: 116 mg/dL — ABNORMAL HIGH (ref 70–99)
Glucose-Capillary: 173 mg/dL — ABNORMAL HIGH (ref 70–99)
Glucose-Capillary: 151 mg/dL — ABNORMAL HIGH (ref 70–99)
Glucose-Capillary: 169 mg/dL — ABNORMAL HIGH (ref 70–99)

## 2019-11-15 NOTE — Progress Notes (Addendum)
Patient ID: Patrick [name], male   DOB: 18-Oct-1955, 64 y.o.   MRN: [id number]     Advanced Heart Failure Rounding Note  PCP-Cardiologist: Dorris Carnes, MD     Patient Profile   64 y/o male w/ h/o CAD s/p remote CABG in 1997 w/ LIMA-LAD, failed kidney transplant, end-stage renal disease on dialysis admitted w/ CP. LHC w/ severe CAD. Echo w/ reduced EF 35-40% and moderate AI. S/p redo CABG x 3 on 4/1 (SVG-LAD, SVG-PDA, SVG-OM) + bioprosthetic AVR. Post operative course c/b by cardiogenic shock, respiratory failure, VT, and protein malnutrition.    Subjective:    Required BiPAP overnight, currently off while eating breakfast.   Continues on pressors but weaning down NE, down from 20 yesterday to 14 today. On VP 0.03. Co-ox resulted at 99.5% (will repeat)  On CVVH for volume removal. Pulling even. CVP 6-7. Wt down 2 lb.   He is a-pacing in the 90s. Remains on amiodarone gtt at 30 mg/hr. 12 lead EKG yesterday showed rate controlled afib.   No complaints this morning. Sitting up in bed eating breakfast.    Objective:   Weight Range: 96.4 kg Body mass index is 33.79 kg/m.   Vital Signs:   Temp:  [97.4 F (36.3 C)-98.9 F (37.2 C)] 97.4 F (36.3 C) (04/20 0640) Pulse Rate:  [61-156] 92 (04/20 0600) Resp:  [13-38] 26 (04/20 0700) BP: (110)/(45) 110/45 (04/19 1425) SpO2:  [73 %-100 %] 100 % (04/20 0700) Arterial Line BP: (83-130)/(38-69) 108/52 (04/20 0700) FiO2 (%):  [40 %] 40 % (04/20 0608) Weight:  [96.4 kg] 96.4 kg (04/20 0500) Last BM Date: 11/14/19  Weight change: Filed Weights   11/13/19 0441 11/14/19 0435 11/15/19 0500  Weight: 98 kg 97.3 kg 96.4 kg    Intake/Output:   Intake/Output Summary (Last 24 hours) at 11/15/2019 0746 Last data filed at 11/15/2019 0700 Gross per 24 hour  Intake 2378.97 ml  Output 2491 ml  Net -112.03 ml      Physical Exam    PHYSICAL EXAM: CVP 6-7 General: chronically ill appearing, moderately obese AAM. No respiratory  difficulty HEENT: normal Neck: supple. no JVD. Carotids 2+ bilat; no bruits. No lymphadenopathy or thyromegaly appreciated. + HD cath  Cor: PMI nondisplaced. Regular rate & rhythm. No rubs, gallops or murmurs. Lungs: clear, no wheezing  Abdomen: soft, nontender, nondistended. No hepatosplenomegaly. No bruits or masses. Good bowel sounds.  Extremities: no cyanosis, clubbing, rash, edema Neuro: alert & oriented x 3, cranial nerves grossly intact. moves all 4 extremities w/o difficulty. Affect pleasant.   Telemetry   A-paced at 17 (personally reviewed).  Labs    CBC Recent Labs    11/14/19 0428 11/14/19 1223 11/14/19 1818 11/15/19 0500  WBC 7.6  --   --  7.6  HGB 7.6*   < > 8.2* 8.1*  HCT 24.7*   < > 26.9* 26.5*  MCV 98.0  --   --  93.6  PLT PLATELET CLUMPS NOTED ON SMEAR, UNABLE TO ESTIMATE  --   --  119*   < > = values in this interval not displayed.   Basic Metabolic Panel Recent Labs    11/14/19 0428 11/14/19 1223 11/14/19 1818 11/15/19 0500  NA 133*   < > 133* 132*  K 4.5   < > 4.2 4.3  CL 99  --  102 101  CO2 25  --  24 23  GLUCOSE 154*  --  188* 180*  BUN 18  --  19  20  CREATININE 1.97*  --  1.98* 2.07*  CALCIUM 7.2*  --  7.0* 6.9*  MG 2.4  --   --  2.3  PHOS 2.1*  --  1.6* 1.5*   < > = values in this interval not displayed.   Liver Function Tests Recent Labs    11/14/19 1818 11/15/19 0500  ALBUMIN 1.7* 1.7*   No results for input(s): LIPASE, AMYLASE in the last 72 hours. Cardiac Enzymes No results for input(s): CKTOTAL, CKMB, CKMBINDEX, TROPONINI in the last 72 hours.  BNP: BNP (last 3 results) No results for input(s): BNP in the last 8760 hours.  ProBNP (last 3 results) No results for input(s): PROBNP in the last 8760 hours.   D-Dimer No results for input(s): DDIMER in the last 72 hours. Hemoglobin A1C No results for input(s): HGBA1C in the last 72 hours. Fasting Lipid Panel No results for input(s): CHOL, HDL, LDLCALC, TRIG, CHOLHDL,  LDLDIRECT in the last 72 hours. Thyroid Function Tests No results for input(s): TSH, T4TOTAL, T3FREE, THYROIDAB in the last 72 hours.  Invalid input(s): FREET3  Other results:   Imaging    No results found.   Medications:     Scheduled Medications: . apixaban  5 mg Oral BID  . bisacodyl  10 mg Oral Daily   Or  . bisacodyl  10 mg Rectal Daily  . chlorhexidine  15 mL Mouth Rinse BID  . Chlorhexidine Gluconate Cloth  6 each Topical Daily  . clopidogrel  75 mg Oral Daily  . darbepoetin (ARANESP) injection - NON-DIALYSIS  60 mcg Subcutaneous Q Sat-1800  . docusate sodium  100 mg Oral Daily  . feeding supplement (ENSURE ENLIVE)  237 mL Oral TID BM  . feeding supplement (PRO-STAT SUGAR FREE 64)  60 mL Per Tube TID  . insulin aspart  0-9 Units Subcutaneous TID AC & HS  . magic mouthwash w/lidocaine  5 mL Oral QID  . mouth rinse  15 mL Mouth Rinse q12n4p  . midodrine  15 mg Oral TID WC  . multivitamin  1 tablet Oral QHS  . pantoprazole  40 mg Oral Daily  . rosuvastatin  10 mg Oral q1800  . sodium chloride flush  10-40 mL Intracatheter Q12H    Infusions: .  prismasol BGK 4/2.5 400 mL/hr at 11/14/19 2313  .  prismasol BGK 4/2.5 200 mL/hr at 11/14/19 2118  . sodium chloride 10 mL/hr at 11/15/19 0700  . sodium chloride    . amiodarone 30 mg/hr (11/15/19 0700)  . feeding supplement (VITAL 1.5 CAL) 1,000 mL (11/14/19 1635)  . norepinephrine (LEVOPHED) Adult infusion 14 mcg/min (11/15/19 0700)  . prismasol BGK 4/2.5 1,600 mL/hr at 11/15/19 0726  . vasopressin (PITRESSIN) infusion - *FOR SHOCK* 0.03 Units/min (11/15/19 0700)    PRN Medications: sodium chloride, Place/Maintain arterial line **AND** sodium chloride, acetaminophen, alteplase, dextrose, fentaNYL (SUBLIMAZE) injection, Gerhardt's butt cream, heparin, levalbuterol, metoprolol tartrate, ondansetron (ZOFRAN) IV, oxyCODONE, sodium chloride, sodium chloride flush, sodium chloride flush, traMADol   Assessment/Plan   1.  Cardiogenic shock: Post-op.  Echo 4/14 with EF 30-35%, stable aortic valve replacement.  Currently on NE 14, vasopressin 0.03, midodrine 15 tid.  MAPs stable in the 70s. Co-ox not accurate (resulted at 99.5%, will repeat).  CVP 6-7 today, on CVVH for volume control  - Continue to slowly wean NE as tolerated, eventually vasopressin as well.  Will stay on midodrine.   - He is at his baseline weight, keep CVVH running net even to facilitate  weaning of pressors. 2. ESRD: CVVH as above.   3. Possible aspiration PNA: He has completed empiric vanco/meropenem. Afebrile. - CXR 4/17 stable with mild pulmonary edema. Personally reviewed  4. Atrial fibrillation: Paroxysmal.  Currently in NSR (a-paced) on amiodarone.  - I would continue amiodarone gtt at 30 while on pressors.   - Continue apixaban. Monitor for bleeding.  5. Anemia, post-op: Hgb 8.1 today. Transfuse hgb < 8. Monitor  6. CAD: Redo CABG x 3 this admission on 4/1 (SVG-LAD, SVG-PDA, SVG-OM).   - Continue Plavix and Crestor.  7. VT:  Frequent NSVT, now on Amiodarone gtt improvement. - Keep K> 4.0 Mg > 2.0  - Off b-blocker with shock 8 Acute hypoxic respiratory failure: due to pulmonary edema and aspiration PNA - now off BIPAP during the day. Wears at night.    Length of Stay: [id number] PA-C  11/15/2019, 7:46 AM  Advanced Heart Failure Team Pager [phone] (M-F; 7a - 4p)   Patient seen with PA, agree with the above note.   Still difficult to wean from pressors, currently this afternoon on NE 16 and vasopressin 0.03.  He is on midodrine 15 mg tid.  CVP 6-7, CVVH running even.    He remains a-paced on amiodarone gtt, stable.  He is on apixaban.   Given difficulty weaning from pressors, will order repeat echo to reassess for pericardial effusion, etc.   Loralie Champagne 11/15/2019 4:33 PM

## 2019-11-15 NOTE — Progress Notes (Signed)
Inpatient Rehabilitation Admissions Coordinator  I met with patient and his wife at bedside. I continue to follow his progress to assist with planning rehab dispo when appropriate.  Danne Baxter, RN, MSN Rehab Admissions Coordinator [phone] 11/15/2019 11:05 AM

## 2019-11-15 NOTE — Progress Notes (Signed)
Patient removed from bipap, by RN, and placed on 5L nasal cannula this AM upon arrival for assessment.  Patient tolerating nasal cannula well at this time.  Will continue to monitor and assess for bipap needs.

## 2019-11-15 NOTE — Progress Notes (Signed)
[facility] Progress Note  Subjective: seen in room, on CRRT, net neg 700 cc yest  Vitals:   11/15/19 0747 11/15/19 0754 11/15/19 0800 11/15/19 0900  BP:  (!) 100/51    Pulse:  92 92 92  Resp:  (!) 24 (!) 21 (!) 28  Temp: 98.3 F (36.8 C)     TempSrc: Axillary     SpO2:  100% 100% 100%  Weight:      Height:        Exam:  alert, chron ill appearing   No jvd  Chest cta ant/lat   Cor reg     Abd obese ntnd    Ext no leg edema, 1+ bilat uE edema    NF, Ox3,gen weakness        Dialysis: [name] MWF 4h 99.5 2K/3.5Ca P4 TDC Hep 3000 - mircera 75 last 3/22 - hect 1    Assessment/ Plan: 1. NSTEMI/CAD - s/p redo CABG 08/02/08 complicated by hypotension and volume overload 2. Cardiogenic shock - holding UF for now per Cardiology to wean pressors 3. Resp failure - d/t pulm edema and asp PNA, improving. Using bipap qhs. 4. Volume - slightly under old dry wt, CVP 6-7 5. ESRD - usual HD MWF.  CRRT started 4/1due to ongoing issues with hypotension and volume overload. Keeping even for now as above.  6. Atrial Fibrillation- on amiodarone IV for this 7. Anemia abl/ ckd - tsat 16%, ferr 1293. Due for esa on 4/5,startedAranesp60 qwk SQ 1st dose 4/10.If Hb continues to drop will need blood transfusion. 8. S/p AVR- good valve function per ECHO. 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 SP IV sodium phos 10. Protein malnutrition- severe with low albumin. On cortrak TF's and also is taking po per the wife.     Rob Andrena Margerum 11/15/2019, 9:50 AM   Recent Labs  Lab 11/14/19 1818 11/15/19 0500  K 4.2 4.3  BUN 19 20  CREATININE 1.98* 2.07*  CALCIUM 7.0* 6.9*  PHOS 1.6* 1.5*  HGB 8.2* 8.1*   Inpatient medications: . apixaban  5 mg Oral BID  . bisacodyl  10 mg Oral Daily   Or  . bisacodyl  10 mg Rectal Daily  . chlorhexidine  15 mL Mouth Rinse BID  . Chlorhexidine Gluconate Cloth  6 each Topical Daily  . clopidogrel  75 mg Oral Daily   . darbepoetin (ARANESP) injection - NON-DIALYSIS  60 mcg Subcutaneous Q Sat-1800  . docusate sodium  100 mg Oral Daily  . feeding supplement (ENSURE ENLIVE)  237 mL Oral TID BM  . feeding supplement (PRO-STAT SUGAR FREE 64)  60 mL Per Tube TID  . insulin aspart  0-9 Units Subcutaneous TID AC & HS  . magic mouthwash w/lidocaine  5 mL Oral QID  . mouth rinse  15 mL Mouth Rinse q12n4p  . midodrine  15 mg Oral TID WC  . multivitamin  1 tablet Oral QHS  . pantoprazole  40 mg Oral Daily  . rosuvastatin  10 mg Oral q1800  . sodium chloride flush  10-40 mL Intracatheter Q12H   .  prismasol BGK 4/2.5 400 mL/hr at 11/14/19 2313  .  prismasol BGK 4/2.5 200 mL/hr at 11/14/19 2118  . sodium chloride 10 mL/hr at 11/15/19 0700  . sodium chloride    . amiodarone 30 mg/hr (11/15/19 0700)  . feeding supplement (VITAL 1.5 CAL) 1,000 mL (11/14/19 1635)  . norepinephrine (LEVOPHED) Adult infusion 14 mcg/min (11/15/19  0700)  . prismasol BGK 4/2.5 1,600 mL/hr at 11/15/19 0726  . vasopressin (PITRESSIN) infusion - *FOR SHOCK* 0.03 Units/min (11/15/19 0700)   sodium chloride, Place/Maintain arterial line **AND** sodium chloride, acetaminophen, alteplase, dextrose, fentaNYL (SUBLIMAZE) injection, Gerhardt's butt cream, heparin, levalbuterol, metoprolol tartrate, ondansetron (ZOFRAN) IV, oxyCODONE, sodium chloride, sodium chloride flush, sodium chloride flush, traMADol

## 2019-11-15 NOTE — Progress Notes (Signed)
19 Days Post-Op Procedure(s) (LRB): REDO CORONARY ARTERY BYPASS GRAFTING TIMES 3  USING LEFT GREATER SAPHENOUS LEG VEIN HARVESTED ENDOSCOPICALLY(CABG) (N/A) TRANSESOPHAGEAL ECHOCARDIOGRAM (TEE) (N/A) AORTIC VALVE REPLACEMENT (AVR) USING EDWARDS INTUITY 23 MM AORTIC VALVE. (N/A) Endovein Harvest Of Greater Saphenous Vein (Left) Subjective: No complaints  Objective: Vital signs in last 24 hours: Temp:  [97.4 F (36.3 C)-98.9 F (37.2 C)] 97.4 F (36.3 C) (04/20 0640) Pulse Rate:  [61-156] 92 (04/20 0600) Cardiac Rhythm: Atrial paced (04/20 0400) Resp:  [13-38] 26 (04/20 0700) BP: (110)/(45) 110/45 (04/19 1425) SpO2:  [73 %-100 %] 100 % (04/20 0700) Arterial Line BP: (83-130)/(38-69) 108/52 (04/20 0700) FiO2 (%):  [40 %] 40 % (04/20 0608) Weight:  [96.4 kg] 96.4 kg (04/20 0500)  Hemodynamic parameters for last 24 hours: CVP:  [0 mmHg-12 mmHg] 1 mmHg  Intake/Output from previous day: 04/19 0701 - 04/20 0700 In: 2379 [I.V.:1283.3; Blood:396.7; NG/GT:699] Out: 2491  Intake/Output this shift: No intake/output data recorded.  General appearance: no distress Neurologic: intact Heart: regular rate and rhythm, S1, S2 normal, no murmur, click, rub or gallop Lungs: clear to auscultation bilaterally Abdomen: soft, non-tender; bowel sounds normal; no masses,  no organomegaly Extremities: edema minimal Wound: c/d/i  Lab Results: Recent Labs    11/13/19 0321 11/13/19 0614 11/14/19 0428 11/14/19 0428 11/14/19 1223 11/14/19 1818  WBC 10.4  --  7.6  --   --   --   HGB 7.7*   < > 7.6*   < > 8.2* 8.2*  HCT 25.3*   < > 24.7*   < > 24.0* 26.9*  PLT 124*  --  PLATELET CLUMPS NOTED ON SMEAR, UNABLE TO ESTIMATE  --   --   --    < > = values in this interval not displayed.   BMET:  Recent Labs    11/14/19 1818 11/15/19 0500  NA 133* 132*  K 4.2 4.3  CL 102 101  CO2 24 23  GLUCOSE 188* 180*  BUN 19 20  CREATININE 1.98* 2.07*  CALCIUM 7.0* 6.9*    PT/INR: No results for  input(s): LABPROT, INR in the last 72 hours. ABG    Component Value Date/Time   PHART 7.438 11/14/2019 1223   HCO3 25.4 11/14/2019 1223   TCO2 26 11/14/2019 1223   ACIDBASEDEF 1.0 11/11/2019 0915   O2SAT 99.5 11/15/2019 0514   CBG (last 3)  Recent Labs    11/14/19 1551 11/14/19 2202 11/15/19 0637  GLUCAP 163* 162* 156*    Assessment/Plan: S/P Procedure(s) (LRB): REDO CORONARY ARTERY BYPASS GRAFTING TIMES 3  USING LEFT GREATER SAPHENOUS LEG VEIN HARVESTED ENDOSCOPICALLY(CABG) (N/A) TRANSESOPHAGEAL ECHOCARDIOGRAM (TEE) (N/A) AORTIC VALVE REPLACEMENT (AVR) USING EDWARDS INTUITY 23 MM AORTIC VALVE. (N/A) Endovein Harvest Of Greater Saphenous Vein (Left) HD  Rehab  Nutrition Support hemodynamics   LOS: 22 days    Wonda Olds 11/15/2019

## 2019-11-15 NOTE — Progress Notes (Addendum)
EVENING ROUNDS NOTE :     [address]       [place] [address]             [phone]                 19 Days Post-Op Procedure(s) (LRB): REDO CORONARY ARTERY BYPASS GRAFTING TIMES 3  USING LEFT GREATER SAPHENOUS LEG VEIN HARVESTED ENDOSCOPICALLY(CABG) (N/A) TRANSESOPHAGEAL ECHOCARDIOGRAM (TEE) (N/A) AORTIC VALVE REPLACEMENT (AVR) USING EDWARDS INTUITY 23 MM AORTIC VALVE. (N/A) Endovein Harvest Of Greater Saphenous Vein (Left)  Total Length of Stay:  LOS: 22 days  BP 108/77   Pulse 94   Temp 98.3 F (36.8 C) (Axillary)   Resp (!) 27   Ht 5' 6.5" (1.689 m)   Wt 96.4 kg   SpO2 96%   BMI 33.79 kg/m   .Intake/Output      04/19 0701 - 04/20 0700 04/20 0701 - 04/21 0700   P.O.     I.V. (mL/kg) 1283.3 (13.3) 277.2 (2.9)   Blood 396.7    NG/GT 699 210   Total Intake(mL/kg) 2379 (24.7) 487.2 (5.1)   Urine (mL/kg/hr)     Other 2491 1102   Stool 0 0   Total Output 2491 1102   Net -112 -614.8        Stool Occurrence 1 x 1 x     .  prismasol BGK 4/2.5 400 mL/hr at 11/15/19 1216  .  prismasol BGK 4/2.5 200 mL/hr at 11/14/19 2118  . sodium chloride 10 mL/hr at 11/15/19 1400  . sodium chloride    . amiodarone 30 mg/hr (11/15/19 1400)  . feeding supplement (VITAL 1.5 CAL) 30 mL/hr at 11/15/19 0800  . norepinephrine (LEVOPHED) Adult infusion 16 mcg/min (11/15/19 1400)  . prismasol BGK 4/2.5 1,600 mL/hr at 11/15/19 1712  . vasopressin (PITRESSIN) infusion - *FOR SHOCK* 0.03 Units/min (11/15/19 0700)     Lab Results  Component Value Date   WBC 7.6 11/15/2019   HGB 8.1 (L) 11/15/2019   HCT 26.5 (L) 11/15/2019   PLT 119 (L) 11/15/2019   GLUCOSE 173 (H) 11/15/2019   CHOL 122 02/15/2019   TRIG 85 02/15/2019   HDL 46 02/15/2019   LDLCALC 59 02/15/2019   ALT 54 (H) 11/01/2019   AST 102 (H) 11/01/2019   NA 134 (L) 11/15/2019   K 4.3 11/15/2019   CL 103 11/15/2019   CREATININE 2.05 (H) 11/15/2019   BUN 18 11/15/2019   CO2 26 11/15/2019   TSH 5.113 (H)  05/25/2016   INR 1.4 (H) 10/28/2019   HGBA1C 6.2 (H) 10/27/2019   On CRRT, trying to wean levophed some but BP soft at times. No new issues this evening  John Giovanni, PA-C   I have seen and examined the patient and agree with the assessment and plan as outlined.  Rexene Alberts, MD 11/15/2019 10:20 PM

## 2019-11-16 ENCOUNTER — Inpatient Hospital Stay (HOSPITAL_COMMUNITY): Payer: Medicare HMO

## 2019-11-16 DIAGNOSIS — I5041 Acute combined systolic (congestive) and diastolic (congestive) heart failure: Secondary | ICD-10-CM

## 2019-11-16 DIAGNOSIS — I214 Non-ST elevation (NSTEMI) myocardial infarction: Secondary | ICD-10-CM | POA: Diagnosis not present

## 2019-11-16 DIAGNOSIS — N186 End stage renal disease: Secondary | ICD-10-CM | POA: Diagnosis not present

## 2019-11-16 DIAGNOSIS — R579 Shock, unspecified: Secondary | ICD-10-CM

## 2019-11-16 DIAGNOSIS — R570 Cardiogenic shock: Secondary | ICD-10-CM | POA: Diagnosis not present

## 2019-11-16 LAB — ECHOCARDIOGRAM COMPLETE
Height: 66.5 in
Weight: 3407.43 oz

## 2019-11-16 LAB — COOXEMETRY PANEL
Carboxyhemoglobin: 1.8 % — ABNORMAL HIGH (ref 0.5–1.5)
Methemoglobin: 1.1 % (ref 0.0–1.5)
O2 Saturation: 54.9 %
Total hemoglobin: 7.8 g/dL — ABNORMAL LOW (ref 12.0–16.0)

## 2019-11-16 LAB — RENAL FUNCTION PANEL
Albumin: 1.7 g/dL — ABNORMAL LOW (ref 3.5–5.0)
Anion gap: 8 (ref 5–15)
BUN: 24 mg/dL — ABNORMAL HIGH (ref 8–23)
CO2: 23 mmol/L (ref 22–32)
Calcium: 7 mg/dL — ABNORMAL LOW (ref 8.9–10.3)
Chloride: 103 mmol/L (ref 98–111)
Creatinine, Ser: 2.14 mg/dL — ABNORMAL HIGH (ref 0.61–1.24)
GFR calc Af Amer: 36 mL/min — ABNORMAL LOW (ref >60)
GFR calc non Af Amer: 31 mL/min — ABNORMAL LOW (ref >60)
Glucose, Bld: 184 mg/dL — ABNORMAL HIGH (ref 70–99)
Phosphorus: 1.7 mg/dL — ABNORMAL LOW (ref 2.5–4.6)
Potassium: 4.5 mmol/L (ref 3.5–5.1)
Sodium: 134 mmol/L — ABNORMAL LOW (ref 135–145)

## 2019-11-16 LAB — CBC
HCT: 25.9 % — ABNORMAL LOW (ref 39.0–52.0)
Hemoglobin: 7.7 g/dL — ABNORMAL LOW (ref 13.0–17.0)
MCH: 28.4 pg (ref 26.0–34.0)
MCHC: 29.7 g/dL — ABNORMAL LOW (ref 30.0–36.0)
MCV: 95.6 fL (ref 80.0–100.0)
Platelets: 137 10*3/uL — ABNORMAL LOW (ref 150–400)
RBC: 2.71 MIL/uL — ABNORMAL LOW (ref 4.22–5.81)
RDW: 22.2 % — ABNORMAL HIGH (ref 11.5–15.5)
WBC: 8.1 10*3/uL (ref 4.0–10.5)
nRBC: 0.6 % — ABNORMAL HIGH (ref 0.0–0.2)

## 2019-11-16 LAB — MAGNESIUM: Magnesium: 2.3 mg/dL (ref 1.7–2.4)

## 2019-11-16 LAB — GLUCOSE, CAPILLARY
Glucose-Capillary: 147 mg/dL — ABNORMAL HIGH (ref 70–99)
Glucose-Capillary: 204 mg/dL — ABNORMAL HIGH (ref 70–99)
Glucose-Capillary: 170 mg/dL — ABNORMAL HIGH (ref 70–99)
Glucose-Capillary: 152 mg/dL — ABNORMAL HIGH (ref 70–99)

## 2019-11-16 MED ORDER — LIDOCAINE HCL (PF) 1 % IJ SOLN
INTRAMUSCULAR | Status: AC
  Administered 2019-11-16: 5 mL
  Filled 2019-11-16: qty 5

## 2019-11-16 MED ORDER — AMIODARONE HCL 200 MG PO TABS
200.0000 mg | ORAL_TABLET | Freq: Two times a day (BID) | ORAL | Status: DC
  Administered 2019-11-16 – 2019-11-18 (×4): 200 mg via ORAL
  Filled 2019-11-16 (×5): qty 1

## 2019-11-16 MED ORDER — LIDOCAINE HCL (PF) 2 % IJ SOLN
10.0000 mL | Freq: Once | INTRAMUSCULAR | Status: AC
  Filled 2019-11-16: qty 10

## 2019-11-16 MED ORDER — SODIUM CHLORIDE 0.9 % IV SOLN: INTRAVENOUS | Status: DC | PRN

## 2019-11-16 NOTE — Progress Notes (Signed)
Physical Therapy Treatment Patient Details Name: Patrick [name] MRN: [id number] DOB: 06/18/1956 Today's Date: 11/16/2019    History of Present Illness Pt is a 64 yo male s/p re-do CABGx3 with Aortic valve replacement. Pt has been on CRRT but removed 4/11 due to hypotension and VTach with return to CRRT 4/12 pt with ileus. PMHx: ESRD MWF, HTN, PNA, T2DM, MI.    PT Comments    Patient with limited progress over the past two weeks due to continued medical issues.  Difficulty tolerating standing due to foot pain, but plans to ask his wife to bring his shoes from home.  Patient remains limited on CRRT and external pacer.  Goals assessed and downgraded this session.  Feel he will continue to benefit from skilled PT in the acute setting and from follow up CIR level rehab prior to return home.    Follow Up Recommendations  CIR;Supervision/Assistance - 24 hour     Equipment Recommendations  Rolling walker with 5" wheels    Recommendations for Other Services       Precautions / Restrictions Precautions Precautions: Fall;Sternal;Other (comment) Precaution Comments: external pacer, flexiseal, CRRT, cortrak    Mobility  Bed Mobility Overal bed mobility: Needs Assistance             General bed mobility comments: used bed for egress for sup>stand, leaning forward with rails  Transfers Overall transfer level: Needs assistance Equipment used: Ambulation equipment used Transfers: Sit to/from Stand Sit to Stand: Mod assist;Max assist;+2 physical assistance         General transfer comment: bed egress to get into position to stand, used back of recliner handles for standing support, patient needing heavy lifting to stand first attempt, less assist needed for second stand, cues and assist for safet lowering without UE support  Ambulation/Gait             General Gait Details: unable due to lines and weakness   Stairs             Wheelchair Mobility    Modified Rankin  (Stroke Patients Only)       Balance Overall balance assessment: Needs assistance   Sitting balance-Leahy Scale: Fair Sitting balance - Comments: EOB in foot egress without support   Standing balance support: Bilateral upper extremity supported Standing balance-Leahy Scale: Poor Standing balance comment: UE support needed with min A of 2, only able to tolerate standing about 10 seconds due to foot pain                            Cognition Arousal/Alertness: Awake/alert Behavior During Therapy: WFL for tasks assessed/performed Overall Cognitive Status: Impaired/Different from baseline Area of Impairment: Memory                     Memory: Decreased recall of precautions         General Comments: reminders for sternal precautions, and some decreased insight not wanting to stand due to pain, but encouraged to try so as to not get to where he cannot stand      Exercises General Exercises - Upper Extremity Shoulder Flexion: AAROM;Both;5 reps;Seated Elbow Flexion: AROM;Both;Seated General Exercises - Lower Extremity Ankle Circles/Pumps: AROM;5 reps;Supine Long Arc Quad: AROM;10 reps;Seated Heel Slides: AAROM;5 reps;Supine    General Comments General comments (skin integrity, edema, etc.): Discussed having wife bring in his shoes from home for improved comfort in standing.  RN in room during mobility  Pertinent Vitals/Pain Pain Assessment: 0-10 Pain Score: 10-Worst pain ever Pain Location: feet and thighs with sit to stand Pain Descriptors / Indicators: Throbbing Pain Intervention(s): Monitored during session;Repositioned;Limited activity within patient's tolerance;Premedicated before session    Home Living                      Prior Function            PT Goals (current goals can now be found in the care plan section) Acute Rehab PT Goals Patient Stated Goal: to get well and go home PT Goal Formulation: With patient Time For Goal  Achievement: 11/30/19 Progress towards PT goals: Goals downgraded-see care plan    Frequency    Min 3X/week      PT Plan Current plan remains appropriate    Co-evaluation              AM-PAC PT "6 Clicks" Mobility   Outcome Measure  Help needed turning from your back to your side while in a flat bed without using bedrails?: A Little Help needed moving from lying on your back to sitting on the side of a flat bed without using bedrails?: A Lot Help needed moving to and from a bed to a chair (including a wheelchair)?: Total Help needed standing up from a chair using your arms (e.g., wheelchair or bedside chair)?: Total Help needed to walk in hospital room?: Total Help needed climbing 3-5 steps with a railing? : Total 6 Click Score: 9    End of Session Equipment Utilized During Treatment: Oxygen Activity Tolerance: Patient limited by fatigue Patient left: in bed;with call bell/phone within reach;with nursing/sitter in room   PT Visit Diagnosis: Other abnormalities of gait and mobility (R26.89);Muscle weakness (generalized) (M62.81)     Time: 7588-3254 PT Time Calculation (min) (ACUTE ONLY): 31 min  Charges:  $Therapeutic Exercise: 8-22 mins $Therapeutic Activity: 8-22 mins                     Magda Kiel, [place] [facility] [phone] 11/16/2019    Reginia Naas 11/16/2019, 11:16 AM

## 2019-11-16 NOTE — Progress Notes (Signed)
  Echocardiogram 2D Echocardiogram has been performed.  Patrick [name] 11/16/2019, 9:34 AM

## 2019-11-16 NOTE — Progress Notes (Signed)
Patient accidentally pulled arterial line whilst asleep. Pressure held till hemostasis. RT made 2 unsuccessful attempts to replace line. Cuff pressures are grossly inaccurate.

## 2019-11-16 NOTE — Progress Notes (Signed)
Nutrition Follow-up  DOCUMENTATION CODES:   Not applicable  INTERVENTION:   Phosphorus remains low on CRRT, recommend aggressive supplementation  Continue Ensure Enlive po TID, each supplement provides 350 kcal and 20 grams of protein  Continue Magic cup TID with meals, each supplement provides 290 kcal and 9 grams of protein  Continue Renal MVI  Continue Regular diet as tolerated  Discussed continuing Cortrak with pt and wife at present time and both in agreement given increased needs related to acute illness, CRRT, wound healing and pt still not eating adequately or consistently;  pt to continue to work on improving his po intake  Tube Feeding:  Vital 1.5 @ 30 ml/hr via Cortrak 60 ml Prostat TID (can be given per Cortrak or by mouth)  Provides: 1680 kcals, 139 grams protein, 550 ml free water. Meets 73% kcal needs and 100% of protein needs.   NUTRITION DIAGNOSIS:   Inadequate oral intake related to acute illness as evidenced by NPO status.  Being addressed via TF   GOAL:   Patient will meet greater than or equal to 90% of their needs  Progressing  MONITOR:   PO intake, Diet advancement, Supplement acceptance, Labs, Weight trends, Skin  REASON FOR ASSESSMENT:   Ventilator    ASSESSMENT:   64 yo male admitted with NSTEMI and found to have mutlivessel CAD and underwent CABG on 4/1, ESRD/HD requiring CRRT. PMH includes ESRD on HD s/p renal transplant, CAD s/p CABG, CHF, DM   3/29 Admission 3/30 Cardiac Cath 4/01 CABG with IABP placement(removed 4/3), Intubated 4/02 CRRT initiated 4/04 Extubated 4/05 Cortrak placed,tip in stomach;okay to feed gastric per RD discussion with Dr. Orvan Seen 4/07 CRRT discontinued, ileus, NPO, Cortrak pulled out by pt 4/08 CL diet in AM, FL diet in PM 4/09 iHD with 1.3 L removed 4/10 CRRT re-initiated 4/11 Diet advanced to Dysphagia III 4/13 Diet advanced to Heart Healthy, Carb Modified 4/14 Cortrak placed 4/15 TF off due to  abdominal pain  Pt remains on CRRT; remains on pressors  Pt worn out upon visit today after working with PT. Per RN, pt did very well at breakfast. Pt ate 100% of breakfast, drank an Ensure and took a Pro-Stat. However, recorded po intake before today has been 0-30%. Pt reports he cannot eat large meals, reports early satiety. Wife indicating at home, pt will eat half of his dinner and then eat the other half later in the evening. Pt is drinking Ensure but usually only one per day. Pt receiving Magic Cup, eating some. Discussed importance of continuing to improve po intake, encouraging pt to drink more Ensure daily, receiving between meals  Pt continues to complain on mouth in pain; sores noted on tip of tongue as well as roof of mouth  Tolerating Vital 1.5 at 30 ml/hr, Pro-Stat 60 mL TID via Cortrak  Labs: phosphorus 1.7 (L), sodium 134 (L), CBGs 147-204 Meds: Rena-Vit, magic mouthwash with lidocaine   Diet Order:   Diet Order            Diet regular Room service appropriate? Yes; Fluid consistency: Thin  Diet effective now              EDUCATION NEEDS:   No education needs have been identified at this time  Skin:  Skin Assessment: Skin Integrity Issues: Skin Integrity Issues:: Stage II, Unstageable DTI: R toe Stage II: sacrum Unstageable: R toe, L toe  Last BM:  4/19  Height:   Ht Readings from Last 1 Encounters:  10/28/19 5' 6.5" (1.689 m)    Weight:   Wt Readings from Last 1 Encounters:  11/16/19 96.6 kg    BMI:  Body mass index is 33.86 kg/m.  Estimated Nutritional Needs:   Kcal:  2300-2700  Protein:  120-140 gm  Fluid:  1000 plus UOP   Kerman Passey MS, RDN, LDN, CNSC RD Pager Number and Weekend/On-Call After Hours Pager Located in [place]

## 2019-11-16 NOTE — Progress Notes (Signed)
Paged Dr. Orvan Seen, he in [place], spoke with [facility] and she informed MD that his aline was pulled out overnight and cuff pressures were all over the place up and down and he is on levo vaso, RT tried twice, Dr. Orvan Seen stated no Aline at this time.

## 2019-11-16 NOTE — Progress Notes (Signed)
RT note-Aline placed by CCM, patient placed on Bipap for increased work of breathing, wean as tolerated.

## 2019-11-16 NOTE — Progress Notes (Addendum)
Patient ID: Patrick [name], male   DOB: 1955/12/13, 64 y.o.   MRN: [id number]     Advanced Heart Failure Rounding Note  PCP-Cardiologist: Dorris Carnes, MD     Patient Profile   64 y/o male w/ h/o CAD s/p remote CABG in 1997 w/ LIMA-LAD, failed kidney transplant, end-stage renal disease on dialysis admitted w/ CP. LHC w/ severe CAD. Echo w/ reduced EF 35-40% and moderate AI. S/p redo CABG x 3 on 4/1 (SVG-LAD, SVG-PDA, SVG-OM) + bioprosthetic AVR. Post operative course c/b by cardiogenic shock, respiratory failure, VT, and protein malnutrition.    Subjective:    Required BiPAP overnight.   Continues on pressors,  NE 14 and VP 0.03. Unfortunately, A-line came out overnight, thus RN has not attempted to wean NE further. Cuff MAP 91.  Co-ox marginal at 55%. Echo ordered to r/o pericardial effusion. Call placed to echo team to do ASAP.   On CVVH for volume removal. Pulling even. CVP 13. Wt stable at 212 lb.   He is a-pacing in the 90s. Remains on amiodarone gtt at 30 mg/hr.  Sitting up in bed. Denies CP. Did not sleep well last night. Main complaint is bilateral leg pain. Asking for pain meds.   Objective:   Weight Range: 96.6 kg Body mass index is 33.86 kg/m.   Vital Signs:   Temp:  [97.9 F (36.6 C)-100.3 F (37.9 C)] 99.2 F (37.3 C) (04/21 0800) Pulse Rate:  [91-96] 92 (04/21 0800) Resp:  [17-37] 29 (04/21 0617) BP: (71-118)/(32-81) 118/81 (04/21 0800) SpO2:  [96 %-100 %] 100 % (04/21 0800) Arterial Line BP: (91-119)/(45-58) 110/53 (04/21 0400) FiO2 (%):  [40 %] 40 % (04/21 0545) Weight:  [96.6 kg] 96.6 kg (04/21 0400) Last BM Date: 11/15/19  Weight change: Filed Weights   11/14/19 0435 11/15/19 0500 11/16/19 0400  Weight: 97.3 kg 96.4 kg 96.6 kg    Intake/Output:   Intake/Output Summary (Last 24 hours) at 11/16/2019 0820 Last data filed at 11/16/2019 0800 Gross per 24 hour  Intake 2823.14 ml  Output 2798 ml  Net 25.14 ml      Physical Exam    PHYSICAL  EXAM: CVP 13 General: chronically ill appearing AAM No respiratory difficulty HEENT: normal Neck: supple. Thick neck, JVD assessment difficult. Carotids 2+ bilat; no bruits. No lymphadenopathy or thyromegaly appreciated. + HD cath  Cor: PMI nondisplaced. Slightly distant sounding heart sounds. Regular rate & rhythm. No rubs, gallops or murmurs. Lungs: CTAB  Abdomen: soft, nontender, nondistended. No hepatosplenomegaly. No bruits or masses. Good bowel sounds.  Extremities: no cyanosis, clubbing, rash, edema, lower extremities extremely tender to touch  Neuro: alert & oriented x 3, cranial nerves grossly intact. moves all 4 extremities w/o difficulty. Affect pleasant.   Telemetry   A-paced at 79 (personally reviewed).  Labs    CBC Recent Labs    11/15/19 0500 11/16/19 0432  WBC 7.6 8.1  HGB 8.1* 7.7*  HCT 26.5* 25.9*  MCV 93.6 95.6  PLT 119* 119*   Basic Metabolic Panel Recent Labs    11/15/19 0500 11/15/19 0500 11/15/19 1519 11/16/19 0432  NA 132*   < > 134* 134*  K 4.3   < > 4.3 4.5  CL 101   < > 103 103  CO2 23   < > 26 23  GLUCOSE 180*   < > 173* 184*  BUN 20   < > 18 24*  CREATININE 2.07*   < > 2.05* 2.14*  CALCIUM 6.9*   < >  7.0* 7.0*  MG 2.3  --   --  2.3  PHOS 1.5*   < > 1.9* 1.7*   < > = values in this interval not displayed.   Liver Function Tests Recent Labs    11/15/19 1519 11/16/19 0432  ALBUMIN 1.7* 1.7*   No results for input(s): LIPASE, AMYLASE in the last 72 hours. Cardiac Enzymes No results for input(s): CKTOTAL, CKMB, CKMBINDEX, TROPONINI in the last 72 hours.  BNP: BNP (last 3 results) No results for input(s): BNP in the last 8760 hours.  ProBNP (last 3 results) No results for input(s): PROBNP in the last 8760 hours.   D-Dimer No results for input(s): DDIMER in the last 72 hours. Hemoglobin A1C No results for input(s): HGBA1C in the last 72 hours. Fasting Lipid Panel No results for input(s): CHOL, HDL, LDLCALC, TRIG, CHOLHDL,  LDLDIRECT in the last 72 hours. Thyroid Function Tests No results for input(s): TSH, T4TOTAL, T3FREE, THYROIDAB in the last 72 hours.  Invalid input(s): FREET3  Other results:   Imaging    No results found.   Medications:     Scheduled Medications: . apixaban  5 mg Oral BID  . bisacodyl  10 mg Oral Daily   Or  . bisacodyl  10 mg Rectal Daily  . chlorhexidine  15 mL Mouth Rinse BID  . Chlorhexidine Gluconate Cloth  6 each Topical Daily  . clopidogrel  75 mg Oral Daily  . darbepoetin (ARANESP) injection - NON-DIALYSIS  60 mcg Subcutaneous Q Sat-1800  . docusate sodium  100 mg Oral Daily  . feeding supplement (ENSURE ENLIVE)  237 mL Oral TID BM  . feeding supplement (PRO-STAT SUGAR FREE 64)  60 mL Per Tube TID  . insulin aspart  0-9 Units Subcutaneous TID AC & HS  . magic mouthwash w/lidocaine  5 mL Oral QID  . mouth rinse  15 mL Mouth Rinse q12n4p  . midodrine  15 mg Oral TID WC  . multivitamin  1 tablet Oral QHS  . pantoprazole  40 mg Oral Daily  . rosuvastatin  10 mg Oral q1800  . sodium chloride flush  10-40 mL Intracatheter Q12H    Infusions: .  prismasol BGK 4/2.5 400 mL/hr at 11/16/19 0126  .  prismasol BGK 4/2.5 200 mL/hr at 11/15/19 2319  . sodium chloride 10 mL/hr at 11/16/19 0800  . sodium chloride    . amiodarone 30 mg/hr (11/16/19 0800)  . feeding supplement (VITAL 1.5 CAL) 30 mL/hr at 11/16/19 0800  . norepinephrine (LEVOPHED) Adult infusion 14 mcg/min (11/16/19 0800)  . prismasol BGK 4/2.5 1,600 mL/hr at 11/16/19 0610  . vasopressin (PITRESSIN) infusion - *FOR SHOCK* 0.03 Units/min (11/16/19 0800)    PRN Medications: sodium chloride, Place/Maintain arterial line **AND** sodium chloride, acetaminophen, alteplase, dextrose, fentaNYL (SUBLIMAZE) injection, Gerhardt's butt cream, heparin, levalbuterol, metoprolol tartrate, ondansetron (ZOFRAN) IV, oxyCODONE, sodium chloride, sodium chloride flush, sodium chloride flush, traMADol   Assessment/Plan   1.  Cardiogenic shock: Post-op.  Echo 4/14 with EF 30-35%, stable aortic valve replacement.  Currently on NE 14, vasopressin 0.03, midodrine 15 tid.  A-line came out overnight. Cuff MAP 90s (? Accuracy). Will try to re-establish a-line access to guide pressor wean. Co-ox marginal at 55%.  CVP 13 today, on CVVH for volume control  - getting STAT echo to r/o pericardial effusion given difficulty weaning pressors. Also w/ slightly distant heart sounds on ascultation. CXR 4/20 also w/ enlargement of cardiac silhouette. Call placed to echo tech to expedite  -  Continue to slowly wean NE as tolerated, eventually vasopressin as well.  Will stay on midodrine.   - He is at his baseline weight, keep CVVH running net even to facilitate weaning of pressors. 2. ESRD: CVVH as above.   3. Possible aspiration PNA: He has completed empiric vanco/meropenem. Afebrile. - CXR 4/17 stable with mild pulmonary edema. Personally reviewed  4. Atrial fibrillation: Paroxysmal.  Currently in NSR (a-paced) on amiodarone.  - I would continue amiodarone gtt at 30 while on pressors.   - Continue apixaban. Monitor for bleeding.  5. Anemia, post-op: Hgb 7.7 today. Transfuse hgb < 8. Monitor  6. CAD: Redo CABG x 3 this admission on 4/1 (SVG-LAD, SVG-PDA, SVG-OM).   - Continue Plavix and Crestor.  7. VT:  Frequent NSVT, now on Amiodarone gtt improvement. - Keep K> 4.0 Mg > 2.0  - Off b-blocker with shock 8 Acute hypoxic respiratory failure: due to pulmonary edema and aspiration PNA - now off BIPAP during the day. Wears at night.    Length of Stay: [address], PA-C  11/16/2019, 8:20 AM  Advanced Heart Failure Team Pager [phone] (M-F; 7a - 4p)   Patient seen with PA, agree with the above note.   Still on NE 16 + vasopression 0.03.  Lost arterial line last night and cuff pressures have been highly variable, unable to wean pressors further.  CVVH stopped, CVP 12 currently.    Echo was done and reviewed. EF 45-50% with  very prominent respirophasic septal variation noted.  Mildly dilated RV with mildly decreased systolic function.  S/p bioprosthetic AVR, stable.  He was able to stand up beside the bed today.   General: NAD Neck: JVP 8-9 cm, no thyromegaly or thyroid nodule.  Lungs: Decreased at bases.  CV: Nondisplaced PMI.  Heart regular S1/S2, no S3/S4, no murmur.  No peripheral edema.   Abdomen: Soft, nontender, no hepatosplenomegaly, no distention.  Skin: Intact without lesions or rashes.  Neurologic: Alert and oriented x 3.  Psych: Normal affect. Extremities: No clubbing or cyanosis.  HEENT: Normal.    He remains a-paced, can stop IV amiodarone and start amiodarone 200 mg bid.   He needs an arterial line to be able to wean pressors, will arrange.   Off CVVH, per renal will try to resume iHD.   Echo with EF 45-50% and significant respirophasic septal variation.  Pericardium in some places appears thickened though windows are poor.  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   - Will try to evaluate pericardium more closely with CT but will need to get peripheral IV in (cannot use femoral line or HD catheter).  Will need to be premedicated for contrast allergy when CT is done.    CRITICAL CARE Performed by: Loralie Champagne  Total critical care time: 35 minutes  Critical care time was exclusive of separately billable procedures and treating other patients.  Critical care was necessary to treat or prevent imminent or life-threatening deterioration.  Critical care was time spent personally by me on the following activities: development of treatment plan with patient and/or surrogate as well as nursing, discussions with consultants, evaluation of patient's response to treatment, examination of patient, obtaining history from patient or surrogate, ordering and performing treatments and interventions, ordering and review of laboratory studies, ordering and  review of radiographic studies, pulse oximetry and re-evaluation of patient's condition.  Loralie Champagne 11/16/2019 4:16 PM

## 2019-11-16 NOTE — Progress Notes (Signed)
      [address]       [place] [address]             [phone]      Evening rounds  on HD  BP 117/88   Pulse 93   Temp (!) 101.2 F (38.4 C) (Oral)   Resp (!) 33   Ht 5' 6.5" (1.689 m)   Wt 96.6 kg   SpO2 100%   BMI 33.86 kg/m  HF [place] at 5L  norepi at 14, vasopressin @ 0.03  CBG elevated 147-204  Continue current Rx  Hajira Verhagen C. Roxan Hockey, MD [facility] [phone]

## 2019-11-16 NOTE — Progress Notes (Signed)
[facility] Progress Note  Subjective: +500 I/O yesterday  Vitals:   11/16/19 1100 11/16/19 1200 11/16/19 1300 11/16/19 1400  BP: (!) 75/27 (!) 96/28 98/76 (!) 79/49  Pulse: 92 92 93 92  Resp: (!) 33 (!) 37 (!) 27 (!) 32  Temp: 98.5 F (36.9 C)     TempSrc: Oral     SpO2: 100% 100% 100% 98%  Weight:      Height:        Exam:  alert, chron ill appearing   No jvd  Chest cta ant/lat   Cor reg     Abd obese ntnd    Ext no leg edema, 1+ bilat uE edema    NF, Ox3,gen weakness        Dialysis: [name] MWF 4h 99.5 2K/3.5Ca P4 TDC Hep 3000 - mircera 75 last 3/22 - hect 1    Assessment/ Plan: 1. NSTEMI/CAD - s/p redo CABG 10/27/26 complicated by hypotension and volume overload 2. Cardiogenic shock - holding UF for now per Cards 3. Resp failure - d/t pulm edema and asp PNA, improving. Using bipap qhs. 4. Volume - 2-3kg under old dry wt, CVP 6-7, mild depend edema on exam 5. ESRD - usual HD MWF.  CRRT started 4/1due to ongoing issues with hypotension and volume overload. Will hold CRRT for 1-2 days, see if BP's improve and/or might tolerate iHD.  6. Atrial Fibrillation- on amiodarone IV for this 7. Anemia abl/ ckd - tsat 16%, ferr 1293. Due for esa on 4/5,startedAranesp60 qwk SQ 1st dose 4/10.If Hb continues to drop will need blood transfusion. 8. S/p AVR- good valve function per ECHO. 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 SP IV sodium phos 10. Protein malnutrition- severe with low albumin. On cortrak TF's and also is taking po per the wife.     Rob Tiernan Suto 11/16/2019, 2:56 PM   Recent Labs  Lab 11/15/19 0500 11/15/19 0500 11/15/19 1519 11/16/19 0432  K 4.3   < > 4.3 4.5  BUN 20   < > 18 24*  CREATININE 2.07*   < > 2.05* 2.14*  CALCIUM 6.9*   < > 7.0* 7.0*  PHOS 1.5*   < > 1.9* 1.7*  HGB 8.1*  --   --  7.7*   < > = values in this interval not displayed.   Inpatient medications: . apixaban  5 mg Oral BID  .  bisacodyl  10 mg Oral Daily   Or  . bisacodyl  10 mg Rectal Daily  . chlorhexidine  15 mL Mouth Rinse BID  . Chlorhexidine Gluconate Cloth  6 each Topical Daily  . clopidogrel  75 mg Oral Daily  . darbepoetin (ARANESP) injection - NON-DIALYSIS  60 mcg Subcutaneous Q Sat-1800  . docusate sodium  100 mg Oral Daily  . feeding supplement (ENSURE ENLIVE)  237 mL Oral TID BM  . feeding supplement (PRO-STAT SUGAR FREE 64)  60 mL Per Tube TID  . insulin aspart  0-9 Units Subcutaneous TID AC & HS  . magic mouthwash w/lidocaine  5 mL Oral QID  . mouth rinse  15 mL Mouth Rinse q12n4p  . midodrine  15 mg Oral TID WC  . multivitamin  1 tablet Oral QHS  . pantoprazole  40 mg Oral Daily  . rosuvastatin  10 mg Oral q1800  . sodium chloride flush  10-40 mL Intracatheter Q12H   . sodium chloride 10 mL/hr at 11/16/19 1400  . sodium  chloride    . amiodarone 30 mg/hr (11/16/19 1400)  . feeding supplement (VITAL 1.5 CAL) 30 mL/hr at 11/16/19 0800  . norepinephrine (LEVOPHED) Adult infusion 14 mcg/min (11/16/19 1400)  . vasopressin (PITRESSIN) infusion - *FOR SHOCK* 0.03 Units/min (11/16/19 1400)   sodium chloride, Place/Maintain arterial line **AND** sodium chloride, acetaminophen, dextrose, fentaNYL (SUBLIMAZE) injection, Gerhardt's butt cream, levalbuterol, metoprolol tartrate, ondansetron (ZOFRAN) IV, oxyCODONE, sodium chloride flush, sodium chloride flush, traMADol

## 2019-11-16 NOTE — Procedures (Signed)
Arterial Catheter Insertion Procedure Note Patrick [name] [id number] [birth year]  Procedure: Insertion of Arterial Catheter  Indications: Blood pressure monitoring  Procedure Details Consent: Risks of procedure as well as the alternatives and risks of each were explained to the (patient/caregiver).  Consent for procedure obtained. Time Out: Verified patient identification, verified procedure, site/side was marked, verified correct patient position, special equipment/implants available, medications/allergies/relevent history reviewed, required imaging and test results available.  Performed  Maximum sterile technique was used including antiseptics, cap, gloves, gown, hand hygiene, mask and sheet. Skin prep: Chlorhexidine; local anesthetic administered 20 gauge catheter was inserted into right radial artery using the Seldinger technique. Dressed in usual sterile fashion.  ULTRASOUND GUIDANCE USED: YES Evaluation Blood flow good; BP tracing good. Complications: No apparent complications.   Eliseo Gum MSN, AGACNP-BC [place] [phone] If no answer, [phone] 11/16/2019, 5:13 PM

## 2019-11-17 ENCOUNTER — Other Ambulatory Visit: Payer: Self-pay

## 2019-11-17 DIAGNOSIS — N186 End stage renal disease: Secondary | ICD-10-CM | POA: Diagnosis not present

## 2019-11-17 DIAGNOSIS — R570 Cardiogenic shock: Secondary | ICD-10-CM | POA: Diagnosis not present

## 2019-11-17 DIAGNOSIS — I214 Non-ST elevation (NSTEMI) myocardial infarction: Secondary | ICD-10-CM | POA: Diagnosis not present

## 2019-11-17 LAB — POCT I-STAT 7, (LYTES, BLD GAS, ICA,H+H)
Acid-base deficit: 3 mmol/L — ABNORMAL HIGH (ref 0.0–2.0)
Acid-base deficit: 3 mmol/L — ABNORMAL HIGH (ref 0.0–2.0)
Acid-base deficit: 2 mmol/L (ref 0.0–2.0)
Bicarbonate: 20.4 mmol/L (ref 20.0–28.0)
Bicarbonate: 21.5 mmol/L (ref 20.0–28.0)
Bicarbonate: 22.5 mmol/L (ref 20.0–28.0)
Calcium, Ion: 0.88 mmol/L — CL (ref 1.15–1.40)
Calcium, Ion: 0.89 mmol/L — CL (ref 1.15–1.40)
Calcium, Ion: 0.94 mmol/L — ABNORMAL LOW (ref 1.15–1.40)
HCT: 23 % — ABNORMAL LOW (ref 39.0–52.0)
HCT: 48 % (ref 39.0–52.0)
HCT: 22 % — ABNORMAL LOW (ref 39.0–52.0)
Hemoglobin: 16.3 g/dL (ref 13.0–17.0)
Hemoglobin: 7.8 g/dL — ABNORMAL LOW (ref 13.0–17.0)
Hemoglobin: 7.5 g/dL — ABNORMAL LOW (ref 13.0–17.0)
O2 Saturation: 100 %
O2 Saturation: 99 %
O2 Saturation: 99 %
Patient temperature: 99.9
Patient temperature: 99.9
Patient temperature: 97.8
Potassium: 5 mmol/L (ref 3.5–5.1)
Potassium: 5.3 mmol/L — ABNORMAL HIGH (ref 3.5–5.1)
Potassium: 5.2 mmol/L — ABNORMAL HIGH (ref 3.5–5.1)
Sodium: 140 mmol/L (ref 135–145)
Sodium: 140 mmol/L (ref 135–145)
Sodium: 139 mmol/L (ref 135–145)
TCO2: 21 mmol/L — ABNORMAL LOW (ref 22–32)
TCO2: 23 mmol/L (ref 22–32)
TCO2: 24 mmol/L (ref 22–32)
pCO2 arterial: 34.3 mmHg (ref 32.0–48.0)
pCO2 arterial: 36 mmHg (ref 32.0–48.0)
pCO2 arterial: 36 mmHg (ref 32.0–48.0)
pH, Arterial: 7.386 (ref 7.350–7.450)
pH, Arterial: 7.387 (ref 7.350–7.450)
pH, Arterial: 7.402 (ref 7.350–7.450)
pO2, Arterial: 145 mmHg — ABNORMAL HIGH (ref 83.0–108.0)
pO2, Arterial: 178 mmHg — ABNORMAL HIGH (ref 83.0–108.0)
pO2, Arterial: 129 mmHg — ABNORMAL HIGH (ref 83.0–108.0)

## 2019-11-17 LAB — COOXEMETRY PANEL
Carboxyhemoglobin: 2.1 % — ABNORMAL HIGH (ref 0.5–1.5)
Methemoglobin: 1.4 % (ref 0.0–1.5)
O2 Saturation: 67.4 %
Total hemoglobin: 7.3 g/dL — ABNORMAL LOW (ref 12.0–16.0)

## 2019-11-17 LAB — CBC
HCT: 25.1 % — ABNORMAL LOW (ref 39.0–52.0)
HCT: 24.1 % — ABNORMAL LOW (ref 39.0–52.0)
Hemoglobin: 7.5 g/dL — ABNORMAL LOW (ref 13.0–17.0)
Hemoglobin: 7.3 g/dL — ABNORMAL LOW (ref 13.0–17.0)
MCH: 28.6 pg (ref 26.0–34.0)
MCH: 28.7 pg (ref 26.0–34.0)
MCHC: 29.9 g/dL — ABNORMAL LOW (ref 30.0–36.0)
MCHC: 30.3 g/dL (ref 30.0–36.0)
MCV: 95.8 fL (ref 80.0–100.0)
MCV: 94.9 fL (ref 80.0–100.0)
Platelets: 162 10*3/uL (ref 150–400)
Platelets: 175 10*3/uL (ref 150–400)
RBC: 2.62 MIL/uL — ABNORMAL LOW (ref 4.22–5.81)
RBC: 2.54 MIL/uL — ABNORMAL LOW (ref 4.22–5.81)
RDW: 21.4 % — ABNORMAL HIGH (ref 11.5–15.5)
RDW: 21.2 % — ABNORMAL HIGH (ref 11.5–15.5)
WBC: 11.7 10*3/uL — ABNORMAL HIGH (ref 4.0–10.5)
WBC: 10.8 10*3/uL — ABNORMAL HIGH (ref 4.0–10.5)
nRBC: 0.4 % — ABNORMAL HIGH (ref 0.0–0.2)
nRBC: 0.3 % — ABNORMAL HIGH (ref 0.0–0.2)

## 2019-11-17 LAB — BASIC METABOLIC PANEL
Anion gap: 8 (ref 5–15)
Anion gap: 9 (ref 5–15)
BUN: 50 mg/dL — ABNORMAL HIGH (ref 8–23)
BUN: 63 mg/dL — ABNORMAL HIGH (ref 8–23)
CO2: 23 mmol/L (ref 22–32)
CO2: 21 mmol/L — ABNORMAL LOW (ref 22–32)
Calcium: 6.7 mg/dL — ABNORMAL LOW (ref 8.9–10.3)
Calcium: 6.3 mg/dL — CL (ref 8.9–10.3)
Chloride: 100 mmol/L (ref 98–111)
Chloride: 104 mmol/L (ref 98–111)
Creatinine, Ser: 4.03 mg/dL — ABNORMAL HIGH (ref 0.61–1.24)
Creatinine, Ser: 4.89 mg/dL — ABNORMAL HIGH (ref 0.61–1.24)
GFR calc Af Amer: 17 mL/min — ABNORMAL LOW (ref >60)
GFR calc Af Amer: 13 mL/min — ABNORMAL LOW (ref >60)
GFR calc non Af Amer: 15 mL/min — ABNORMAL LOW (ref >60)
GFR calc non Af Amer: 12 mL/min — ABNORMAL LOW (ref >60)
Glucose, Bld: 233 mg/dL — ABNORMAL HIGH (ref 70–99)
Glucose, Bld: 154 mg/dL — ABNORMAL HIGH (ref 70–99)
Potassium: 4.9 mmol/L (ref 3.5–5.1)
Potassium: 5.6 mmol/L — ABNORMAL HIGH (ref 3.5–5.1)
Sodium: 131 mmol/L — ABNORMAL LOW (ref 135–145)
Sodium: 134 mmol/L — ABNORMAL LOW (ref 135–145)

## 2019-11-17 LAB — GLUCOSE, CAPILLARY
Glucose-Capillary: 196 mg/dL — ABNORMAL HIGH (ref 70–99)
Glucose-Capillary: 165 mg/dL — ABNORMAL HIGH (ref 70–99)
Glucose-Capillary: 148 mg/dL — ABNORMAL HIGH (ref 70–99)
Glucose-Capillary: 131 mg/dL — ABNORMAL HIGH (ref 70–99)
Glucose-Capillary: 158 mg/dL — ABNORMAL HIGH (ref 70–99)
Glucose-Capillary: 160 mg/dL — ABNORMAL HIGH (ref 70–99)

## 2019-11-17 MED ORDER — CHLORHEXIDINE GLUCONATE CLOTH 2 % EX PADS
6.0000 | MEDICATED_PAD | Freq: Every day | CUTANEOUS | Status: DC
  Administered 2019-11-17 – 2019-11-20 (×4): 6 via TOPICAL

## 2019-11-17 MED ORDER — PRO-STAT SUGAR FREE PO LIQD
30.0000 mL | Freq: Every day | ORAL | Status: DC
  Administered 2019-11-17 – 2019-11-21 (×15): 30 mL
  Filled 2019-11-17 (×13): qty 30

## 2019-11-17 MED ORDER — PANTOPRAZOLE SODIUM 40 MG PO TBEC: 40.0000 mg | DELAYED_RELEASE_TABLET | Freq: Every day | ORAL | 2 refills | Status: DC

## 2019-11-17 MED ORDER — DIPHENHYDRAMINE HCL 25 MG PO CAPS: 50.0000 mg | ORAL_CAPSULE | Freq: Once | ORAL | Status: AC

## 2019-11-17 MED ORDER — DIPHENHYDRAMINE HCL 50 MG/ML IJ SOLN
50.0000 mg | Freq: Once | INTRAMUSCULAR | Status: AC
  Administered 2019-11-18: 50 mg via INTRAVENOUS
  Filled 2019-11-17: qty 1

## 2019-11-17 MED ORDER — VITAL 1.5 CAL PO LIQD
1000.0000 mL | ORAL | Status: DC
  Administered 2019-11-18 – 2019-11-21 (×5): 1000 mL
  Filled 2019-11-17 (×5): qty 1000

## 2019-11-17 MED ORDER — CALCIUM GLUCONATE-NACL 1-0.675 GM/50ML-% IV SOLN
1.0000 g | Freq: Once | INTRAVENOUS | Status: AC
  Administered 2019-11-17: 1000 mg via INTRAVENOUS
  Filled 2019-11-17: qty 50

## 2019-11-17 MED ORDER — PREDNISONE 20 MG PO TABS
50.0000 mg | ORAL_TABLET | Freq: Four times a day (QID) | ORAL | Status: AC
  Administered 2019-11-17 – 2019-11-18 (×3): 50 mg via ORAL
  Filled 2019-11-17 (×3): qty 2

## 2019-11-17 MED ORDER — ACETAMINOPHEN 160 MG/5ML PO SOLN
650.0000 mg | Freq: Four times a day (QID) | ORAL | Status: DC | PRN
  Administered 2019-11-17 – 2019-12-12 (×11): 650 mg via ORAL
  Filled 2019-11-17 (×11): qty 20.3

## 2019-11-17 NOTE — Progress Notes (Signed)
Nutrition Follow-up  DOCUMENTATION CODES:   Not applicable  INTERVENTION:   Tube Feeding via Cortrak:  Increase Vital 1.5 at 40 ml/hr Pro-Stat 30 mL 5 times daily Provides 1940 kcals, 140 g of protein and 730 mL of free water  Recommend rechecking phosphorus before HD. If remains <2.0, recommend considering supplementation  When able, Continue Ensure Enlive po TID, each supplement provides 350 kcal and 20 grams of protein. Currently on hold for 48 hours per MD  Magic cup TID with meals, each supplement provides 290 kcal and 9 grams of protein  Continue B-complex with C   NUTRITION DIAGNOSIS:   Inadequate oral intake related to acute illness as evidenced by NPO status.  Being addressed via TF   GOAL:   Patient will meet greater than or equal to 90% of their needs  Progressing  MONITOR:   PO intake, Diet advancement, Supplement acceptance, Labs, Weight trends, Skin  REASON FOR ASSESSMENT:   Ventilator    ASSESSMENT:   64 yo male admitted with NSTEMI and found to have mutlivessel CAD and underwent CABG on 4/1, ESRD/HD requiring CRRT. PMH includes ESRD on HD s/p renal transplant, CAD s/p CABG, CHF, DM   Pt back on Bipap this AM. Per RN, pt allowed to take po as able but Nephrologist wanting to limit fluid as much as possible and requests Ensure be held for 48 hours. Pt now on 800 mL fluid restriction  Tolerating Vital 1.5 at 30 ml/hr with Pro-Stat 60 mL TID via Cortrak tube  Given pt tolerating TF and not taking much food from meal trays and Ensure currently on hold per MD request, plan to increase TF rate to better meet nutritional needs  Phosphorus <2.0 yesterday; not rechecked. Off CRRT, plan for HD tomorrow. Recommend rechecking phosphorus before HD. If remains <2.0, recommend considering supplementation   Labs: sodium 131 Meds: ss novolog, rena-vit, prednisone   Diet Order:   Diet Order            Diet regular Room service appropriate? Yes; Fluid  consistency: Thin; Fluid restriction: Other (see comments)  Diet effective now              EDUCATION NEEDS:   No education needs have been identified at this time  Skin:  Skin Assessment: Skin Integrity Issues: Skin Integrity Issues:: Stage II, Unstageable DTI: R toe Stage II: sacrum Unstageable: R toe, L toe  Last BM:  4/22  Height:   Ht Readings from Last 1 Encounters:  10/28/19 5' 6.5" (1.689 m)    Weight:   Wt Readings from Last 1 Encounters:  11/17/19 98.7 kg    Ideal Body Weight:     BMI:  Body mass index is 34.59 kg/m.  Estimated Nutritional Needs:   Kcal:  2300-2700  Protein:  120-140 gm  Fluid:  1000 plus UOP  Kerman Passey MS, RDN, LDN, CNSC RD Pager Number and Weekend/On-Call After Hours Pager Located in [place]

## 2019-11-17 NOTE — Progress Notes (Signed)
Patrick [name]  Subjective: +655 cc I/O yesterday  Vitals:   11/17/19 0401 11/17/19 0402 11/17/19 0403 11/17/19 0700  BP:      Pulse: 92 93 92   Resp: (!) 27 (!) 29 (!) 28   Temp:  (!) 97.1 F (36.2 C)  98 F (36.7 C)  TempSrc:  Axillary  Oral  SpO2: 97% 97% 97%   Weight:  98.7 kg    Height:        Exam:  alert, chron ill appearing   No jvd  Chest cta ant/lat   Cor reg     Abd obese ntnd    Ext no leg edema, 1+ bilat uE edema    NF, Ox3,gen weakness      Dialysis: [name] MWF 4h 99.5 2K/3.5Ca P4 TDC Hep 3000 - mircera 75 last 3/22 - hect 1  Assessment/ Plan: 1. NSTEMI/CAD - s/p redo CABG 11/30/36 complicated by shock 2. Cardiogenic shock - on vaso/ levo gtt's, per cardiology. Getting CTA of chest to look at pericardium tomorrow (+contrast allergy).  3. Resp failure - d/t pulm edema and asp PNA, improved. Using bipap qhs. 4. Volume - 1 kg under old dry wt, CVP 6-7, mild depend edema on exam 5. ESRD - HD MWF.  CRRT started 4/1 heredue to refractory hypotension and vol excess. CRRT dc'd yest. Plan for reg HD tomorrow, use pressor support as needed.  6. Atrial Fibrillation- on amiodarone IV  7. Anemia abl/ ckd - tsat 16%, ferr 1293. StartedAranesp60 qwk SQ 1st dose 4/10.If Hb continues to drop will need blood transfusion. 8. S/p AVR- good valve function per ECHO. 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 SP IV sodium phos 10. Protein malnutrition- severe with low albumin. On cortrak TF's and also is taking po per the wife.     Patrick [name] 11/17/2019, 11:27 AM   Recent Labs  Lab 11/15/19 1519 11/15/19 1519 11/16/19 0432 11/17/19 0537  K 4.3   < > 4.5 4.9  BUN 18   < > 24* 50*  CREATININE 2.05*   < > 2.14* 4.03*  CALCIUM 7.0*   < > 7.0* 6.7*  PHOS 1.9*  --  1.7*  --   HGB  --   --  7.7* 7.5*   < > = values in this interval not displayed.   Inpatient medications: . amiodarone  200 mg Oral BID  . apixaban   5 mg Oral BID  . bisacodyl  10 mg Oral Daily   Or  . bisacodyl  10 mg Rectal Daily  . chlorhexidine  15 mL Mouth Rinse BID  . Chlorhexidine Gluconate Cloth  6 each Topical Daily  . clopidogrel  75 mg Oral Daily  . darbepoetin (ARANESP) injection - NON-DIALYSIS  60 mcg Subcutaneous Q Sat-1800  . [START ON 11/18/2019] diphenhydrAMINE  50 mg Oral Once   Or  . [START ON 11/18/2019] diphenhydrAMINE  50 mg Intravenous Once  . docusate sodium  100 mg Oral Daily  . feeding supplement (ENSURE ENLIVE)  237 mL Oral TID BM  . feeding supplement (PRO-STAT SUGAR FREE 64)  60 mL Per Tube TID  . insulin aspart  0-9 Units Subcutaneous TID AC & HS  . magic mouthwash w/lidocaine  5 mL Oral QID  . mouth rinse  15 mL Mouth Rinse q12n4p  . midodrine  15 mg Oral TID WC  . multivitamin  1 tablet Oral QHS  . pantoprazole  40  mg Oral Daily  . predniSONE  50 mg Oral Q6H  . rosuvastatin  10 mg Oral q1800  . sodium chloride flush  10-40 mL Intracatheter Q12H   . sodium chloride 10 mL/hr at 11/16/19 2300  . sodium chloride    . sodium chloride    . feeding supplement (VITAL 1.5 CAL) 1,000 mL (11/17/19 0649)  . norepinephrine (LEVOPHED) Adult infusion 12 mcg/min (11/16/19 2300)  . vasopressin (PITRESSIN) infusion - *FOR SHOCK* 0.03 Units/min (11/17/19 0109)   sodium chloride, Place/Maintain arterial line **AND** sodium chloride, Place/Maintain arterial line **AND** sodium chloride, acetaminophen, dextrose, fentaNYL (SUBLIMAZE) injection, Gerhardt's butt cream, levalbuterol, metoprolol tartrate, ondansetron (ZOFRAN) IV, oxyCODONE, sodium chloride flush, sodium chloride flush, traMADol

## 2019-11-17 NOTE — Progress Notes (Signed)
Spoke with Dr. Orvan Seen to inform regarding episode of bradycardia 53-58, burst of A-fib then paced at 92 at 1506. Per Dr. Orvan Seen possible vagal episode.  1 amp of Calcium ordered will initiate and continue to monitor for any further changes.

## 2019-11-17 NOTE — Progress Notes (Signed)
Placed on Enteric precautions per Dr. Aundra Dubin and Dr. Orvan Seen to r/o C-Diff.  Informed and educated wife Patrick [name] of precautions until results of stool sample.  Hanger placed on the outside of the door.

## 2019-11-17 NOTE — Progress Notes (Signed)
Notified Dr. Aundra Dubin regarding episode of bradycardia 53-58, burst of A-fib then paced at 92 at 1506.  Brittany,PA at bedside.  Labs ordered will follow up with results.  Agas done via I-stat PO2 178, TCO2 21, Calcium Ion 0.89.  Current temp. 101.6 axillary.  Informed Dr. Aundra Dubin of BM x3 very loose.  Will continue to monitor for any further changes.

## 2019-11-17 NOTE — Progress Notes (Signed)
Patient ID: Patrick [name], male   DOB: Dec 17, 1955, 64 y.o.   MRN: [id number]     Advanced Heart Failure Rounding Note  PCP-Cardiologist: Dorris Carnes, MD     Patient Profile   64 y/o male w/ h/o CAD s/p remote CABG in 1997 w/ LIMA-LAD, failed kidney transplant, end-stage renal disease on dialysis admitted w/ CP. LHC w/ severe CAD. Echo w/ reduced EF 35-40% and moderate AI. S/p redo CABG x 3 on 4/1 (SVG-LAD, SVG-PDA, SVG-OM) + bioprosthetic AVR. Post operative course c/b by cardiogenic shock, respiratory failure, VT, and protein malnutrition.    Subjective:    Continues on pressors,  NE 14 and VP 0.03.  Co-ox 67%, CVP 10.    He is off CVVH now.  Awaiting iHD tomorrow.   He is a-pacing in the 90s, on amiodarone po.   No chest pain or dyspnea, feels ok.   Objective:   Weight Range: 98.7 kg Body mass index is 34.59 kg/m.   Vital Signs:   Temp:  [97.1 F (36.2 C)-102.2 F (39 C)] 98 F (36.7 C) (04/22 0700) Pulse Rate:  [92-95] 92 (04/22 0403) Resp:  [27-43] 28 (04/22 0403) BP: (75-117)/(27-88) 115/48 (04/22 0350) SpO2:  [95 %-100 %] 97 % (04/22 0403) Arterial Line BP: (78-137)/(40-65) 118/54 (04/22 0403) FiO2 (%):  [40 %] 40 % (04/21 2343) Weight:  [98.7 kg] 98.7 kg (04/22 0402) Last BM Date: 11/17/19  Weight change: Filed Weights   11/15/19 0500 11/16/19 0400 11/17/19 0402  Weight: 96.4 kg 96.6 kg 98.7 kg    Intake/Output:   Intake/Output Summary (Last 24 hours) at 11/17/2019 0825 Last data filed at 11/17/2019 0500 Gross per 24 hour  Intake 1513.8 ml  Output 247 ml  Net 1266.8 ml      Physical Exam    General: NAD Neck: Thick, JVP 8-9 cm, no thyromegaly or thyroid nodule.  Lungs: Decreased at bases. CV: Nondisplaced PMI.  Heart regular S1/S2, no S3/S4, no murmur.  No peripheral edema.   Abdomen: Soft, nontender, no hepatosplenomegaly, no distention.  Skin: Intact without lesions or rashes.  Neurologic: Alert and oriented x 3.  Psych: Normal  affect. Extremities: No clubbing or cyanosis.  HEENT: Normal.    Telemetry   A-paced at 90 (personally reviewed).  Labs    CBC Recent Labs    11/16/19 0432 11/17/19 0537  WBC 8.1 11.7*  HGB 7.7* 7.5*  HCT 25.9* 25.1*  MCV 95.6 95.8  PLT 137* 160   Basic Metabolic Panel Recent Labs    11/15/19 0500 11/15/19 0500 11/15/19 1519 11/15/19 1519 11/16/19 0432 11/17/19 0537  NA 132*   < > 134*   < > 134* 131*  K 4.3   < > 4.3   < > 4.5 4.9  CL 101   < > 103   < > 103 100  CO2 23   < > 26   < > 23 23  GLUCOSE 180*   < > 173*   < > 184* 233*  BUN 20   < > 18   < > 24* 50*  CREATININE 2.07*   < > 2.05*   < > 2.14* 4.03*  CALCIUM 6.9*   < > 7.0*   < > 7.0* 6.7*  MG 2.3  --   --   --  2.3  --   PHOS 1.5*   < > 1.9*  --  1.7*  --    < > = values in this interval not  displayed.   Liver Function Tests Recent Labs    11/15/19 1519 11/16/19 0432  ALBUMIN 1.7* 1.7*   No results for input(s): LIPASE, AMYLASE in the last 72 hours. Cardiac Enzymes No results for input(s): CKTOTAL, CKMB, CKMBINDEX, TROPONINI in the last 72 hours.  BNP: BNP (last 3 results) No results for input(s): BNP in the last 8760 hours.  ProBNP (last 3 results) No results for input(s): PROBNP in the last 8760 hours.   D-Dimer No results for input(s): DDIMER in the last 72 hours. Hemoglobin A1C No results for input(s): HGBA1C in the last 72 hours. Fasting Lipid Panel No results for input(s): CHOL, HDL, LDLCALC, TRIG, CHOLHDL, LDLDIRECT in the last 72 hours. Thyroid Function Tests No results for input(s): TSH, T4TOTAL, T3FREE, THYROIDAB in the last 72 hours.  Invalid input(s): FREET3  Other results:   Imaging    ECHOCARDIOGRAM COMPLETE  Result Date: 11/16/2019    ECHOCARDIOGRAM REPORT   Patient Name:   Patrick [name] Date of Exam: 11/16/2019 Medical Rec #:  [id number]       Height:       66.5 in Accession #:    [id number]      Weight:       213.0 lb Date of Birth:  12-03-1955        BSA:           2.065 m Patient Age:    66 years        BP:           118/81 mmHg Patient Gender: M               HR:           92 bpm. Exam Location:  Inpatient Procedure: 2D Echo STAT ECHO Indications:    CHF 428  History:        Patient has prior history of Echocardiogram examinations, most                 recent 11/09/2019. Cardiomyopathy, Previous Myocardial Infarction                 and CAD, Prior CABG, Arrythmias:Atrial Flutter; Risk                 Factors:Diabetes, Hypertension and Former Smoker. Aortic valve                 replacement.                 Aortic Valve: 23 mm bioprosthetic valve is present in the aortic                 position. Procedure Date: 10/27/2019.  Sonographer:    Jannett Celestine RDCS (AE) Referring Phys: [place]  Sonographer Comments: Technically difficult study due to poor echo windows. limited mobility IMPRESSIONS  1. There is concern for a small to moderate pericardial effusion but the study is very poor quality. Possibly it is more pronounsed posteriorly to the LV. There is enhanced ventricular interdependce which could be see post pericardiotomy, constriction, or in tamponade. The study was too poor quality for assessment of MV/TV inflow. He seems to have had similar septal bounce on prior studies. EF continues to improve, 45-50%. Overall, clinical correlation is recommended. CT scan vs TEE may be helpful to futher delineate the pericardial space.  2. Left ventricular ejection fraction, by estimation, is 45 to 50%. The left ventricle has mildly decreased function. The left ventricle demonstrates  global hypokinesis. There is mild concentric left ventricular hypertrophy. Left ventricular diastolic function could not be evaluated.  3. Right ventricular systolic function is mildly reduced. The right ventricular size is mildly enlarged. Tricuspid regurgitation signal is inadequate for assessing PA pressure.  4. The mitral valve is degenerative. No evidence of mitral valve  regurgitation. No evidence of mitral stenosis.  5. The aortic valve has been repaired/replaced. Aortic valve regurgitation is not visualized. There is a 23 mm bioprosthetic valve present in the aortic position. Procedure Date: 10/27/2019. Echo findings are consistent with normal structure and function of the aortic valve prosthesis. Aortic valve area, by VTI measures 2.61 cm. Aortic valve mean gradient measures 4.0 mmHg. Aortic valve Vmax measures 1.58 m/s. Comparison(s): Changes from prior study are noted. FINDINGS  Left Ventricle: Left ventricular ejection fraction, by estimation, is 45 to 50%. The left ventricle has mildly decreased function. The left ventricle demonstrates global hypokinesis. The left ventricular internal cavity size was normal in size. There is  mild concentric left ventricular hypertrophy. Left ventricular diastolic function could not be evaluated. Right Ventricle: The right ventricular size is mildly enlarged. Right vetricular wall thickness was not assessed. Right ventricular systolic function is mildly reduced. Tricuspid regurgitation signal is inadequate for assessing PA pressure. Left Atrium: Left atrial size was normal in size. Right Atrium: Right atrial size was normal in size. Pericardium: A small pericardial effusion is present. The pericardial effusion is circumferential. Mitral Valve: The mitral valve is degenerative in appearance. Mild to moderate mitral annular calcification. No evidence of mitral valve regurgitation. No evidence of mitral valve stenosis. Tricuspid Valve: The tricuspid valve is grossly normal. Tricuspid valve regurgitation is not demonstrated. No evidence of tricuspid stenosis. Aortic Valve: The aortic valve has been repaired/replaced. Aortic valve regurgitation is not visualized. Aortic valve mean gradient measures 4.0 mmHg. Aortic valve peak gradient measures 10.0 mmHg. Aortic valve area, by VTI measures 2.61 cm. There is a 23 mm bioprosthetic valve present in  the aortic position. Procedure Date: 10/27/2019. Echo findings are consistent with normal structure and function of the aortic valve prosthesis. Pulmonic Valve: The pulmonic valve was grossly normal. Pulmonic valve regurgitation is not visualized. No evidence of pulmonic stenosis. Aorta: The aortic root is normal in size and structure. Venous: The inferior vena cava was not well visualized. IAS/Shunts: The atrial septum is grossly normal.  LEFT VENTRICLE PLAX 2D LVIDd:         3.94 cm  Diastology LVIDs:         2.54 cm  LV e' lateral:   6.74 cm/s LV PW:         1.03 cm  LV E/e' lateral: 17.4 LV IVS:        1.33 cm LVOT diam:     2.30 cm LV SV:         60 LV SV Index:   29 LVOT Area:     4.15 cm  LEFT ATRIUM         Index LA diam:    2.80 cm 1.36 cm/m  AORTIC VALVE AV Area (Vmax):    2.40 cm AV Area (Vmean):   2.23 cm AV Area (VTI):     2.61 cm AV Vmax:           158.00 cm/s AV Vmean:          97.300 cm/s AV VTI:            0.231 m AV Peak Grad:  10.0 mmHg AV Mean Grad:      4.0 mmHg LVOT Vmax:         91.40 cm/s LVOT Vmean:        52.200 cm/s LVOT VTI:          0.145 m LVOT/AV VTI ratio: 0.63  AORTA Ao Root diam: 2.80 cm MITRAL VALVE MV Area (PHT): 2.39 cm     SHUNTS MV Decel Time: 317 msec     Systemic VTI:  0.14 m MV E velocity: 117.00 cm/s  Systemic Diam: 2.30 cm MV A velocity: 145.00 cm/s MV E/A ratio:  0.81 Eleonore Chiquito MD Electronically signed by Eleonore Chiquito MD Signature Date/Time: 11/16/2019/10:54:26 AM    Final      Medications:     Scheduled Medications: . amiodarone  200 mg Oral BID  . apixaban  5 mg Oral BID  . bisacodyl  10 mg Oral Daily   Or  . bisacodyl  10 mg Rectal Daily  . chlorhexidine  15 mL Mouth Rinse BID  . Chlorhexidine Gluconate Cloth  6 each Topical Daily  . clopidogrel  75 mg Oral Daily  . darbepoetin (ARANESP) injection - NON-DIALYSIS  60 mcg Subcutaneous Q Sat-1800  . docusate sodium  100 mg Oral Daily  . feeding supplement (ENSURE ENLIVE)  237 mL Oral TID BM   . feeding supplement (PRO-STAT SUGAR FREE 64)  60 mL Per Tube TID  . insulin aspart  0-9 Units Subcutaneous TID AC & HS  . magic mouthwash w/lidocaine  5 mL Oral QID  . mouth rinse  15 mL Mouth Rinse q12n4p  . midodrine  15 mg Oral TID WC  . multivitamin  1 tablet Oral QHS  . pantoprazole  40 mg Oral Daily  . rosuvastatin  10 mg Oral q1800  . sodium chloride flush  10-40 mL Intracatheter Q12H    Infusions: . sodium chloride 10 mL/hr at 11/16/19 2300  . sodium chloride    . sodium chloride    . feeding supplement (VITAL 1.5 CAL) 1,000 mL (11/17/19 0649)  . norepinephrine (LEVOPHED) Adult infusion 12 mcg/min (11/16/19 2300)  . vasopressin (PITRESSIN) infusion - *FOR SHOCK* 0.03 Units/min (11/17/19 0109)    PRN Medications: sodium chloride, Place/Maintain arterial line **AND** sodium chloride, Place/Maintain arterial line **AND** sodium chloride, acetaminophen, dextrose, fentaNYL (SUBLIMAZE) injection, Gerhardt's butt cream, levalbuterol, metoprolol tartrate, ondansetron (ZOFRAN) IV, oxyCODONE, sodium chloride flush, sodium chloride flush, traMADol   Assessment/Plan   1. Cardiogenic shock: Post-op.  Echo 4/14 with EF 30-35%, stable aortic valve replacement.  Echo on 4/21 with EF 45-50% and significant respirophasic septal variation.  Pericardium in some places appears thickened though windows are poor.  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  Currently still on NE 14, vasopressin 0.03, midodrine 15 tid.  CVP 10 off CVVH, co-ox 67%. - Will try to evaluate pericardium more closely with gated CTA chest.  Will need to be premedicated for contrast allergy, likely will get steroids overnight then CT tomorrow. - Continue to slowly wean NE as tolerated, eventually vasopressin as well.  Will stay on midodrine.   - Plan to try iHD tomorrow. 2. ESRD: Resume iHD tomorrow.   3. Possible aspiration PNA: He has completed empiric vanco/meropenem.  Afebrile. 4. Atrial fibrillation: Paroxysmal.  Currently in NSR (a-paced) on po amiodarone.  - Continue amiodarone 200 mg bid.   - Continue apixaban. Monitor for bleeding.  5. Anemia, post-op: Hgb relatively stable 7.5 today, transfuse < 7.  6. CAD:  Redo CABG x 3 this admission on 4/1 (SVG-LAD, SVG-PDA, SVG-OM).   - Continue Plavix and Crestor.  7. VT:  Frequent NSVT, now on Amiodarone gtt improvement. - Keep K> 4.0 Mg > 2.0  - Off b-blocker with shock 8 Acute hypoxic respiratory failure: due to pulmonary edema and aspiration PNA - now off BIPAP during the day. Wears at night.   CRITICAL CARE Performed by: Loralie Champagne  Total critical care time: 35 minutes  Critical care time was exclusive of separately billable procedures and treating other patients.  Critical care was necessary to treat or prevent imminent or life-threatening deterioration.  Critical care was time spent personally by me on the following activities: development of treatment plan with patient and/or surrogate as well as nursing, discussions with consultants, evaluation of patient's response to treatment, examination of patient, obtaining history from patient or surrogate, ordering and performing treatments and interventions, ordering and review of laboratory studies, ordering and review of radiographic studies, pulse oximetry and re-evaluation of patient's condition.  Length of Stay: [place], MD  11/17/2019, 8:25 AM  Advanced Heart Failure Team Pager [phone] (M-F; 7a - 4p)

## 2019-11-17 NOTE — Progress Notes (Signed)
EVENING ROUNDS NOTE :     [address]       [place] [address]             [phone]                 21 Days Post-Op Procedure(s) (LRB): REDO CORONARY ARTERY BYPASS GRAFTING TIMES 3  USING LEFT GREATER SAPHENOUS LEG VEIN HARVESTED ENDOSCOPICALLY(CABG) (N/A) TRANSESOPHAGEAL ECHOCARDIOGRAM (TEE) (N/A) AORTIC VALVE REPLACEMENT (AVR) USING EDWARDS INTUITY 23 MM AORTIC VALVE. (N/A) Endovein Harvest Of Greater Saphenous Vein (Left)   Total Length of Stay:  LOS: 24 days  Events:  Actively being paced Back on bipap Lots of loose stools today, now on C.diff precautions    BP (!) 115/48   Pulse 92   Temp 99.4 F (37.4 C) (Oral)   Resp (!) 28   Ht 5' 6.5" (1.689 m)   Wt 98.7 kg   SpO2 100%   BMI 34.59 kg/m   CVP:  [0 mmHg-12 mmHg] 12 mmHg  Vent Mode: BIPAP;PCV FiO2 (%):  [40 %] 40 % Set Rate:  [15 bmp] 15 bmp PEEP:  [5 cmH20] 5 cmH20  . sodium chloride Stopped (11/17/19 1301)  . sodium chloride    . sodium chloride    . calcium gluconate    . norepinephrine (LEVOPHED) Adult infusion 10 mcg/min (11/17/19 1600)  . vasopressin (PITRESSIN) infusion - *FOR SHOCK* 0.03 Units/min (11/17/19 1600)    I/O last 3 completed shifts: In: 3174.5 [I.V.:1614.5; NG/GT:1560] Out: 1950 [ZESPQ:3300]   CBC Latest Ref Rng & Units 11/17/2019 11/17/2019 11/17/2019  WBC 4.0 - 10.5 K/uL - - 11.7(H)  Hemoglobin 13.0 - 17.0 g/dL 16.3 7.8(L) 7.5(L)  Hematocrit 39.0 - 52.0 % 48.0 23.0(L) 25.1(L)  Platelets 150 - 400 K/uL - - 162    BMP Latest Ref Rng & Units 11/17/2019 11/17/2019 11/17/2019  Glucose 70 - 99 mg/dL - - 233(H)  BUN 8 - 23 mg/dL - - 50(H)  Creatinine 0.61 - 1.24 mg/dL - - 4.03(H)  BUN/Creat Ratio 10 - 24 - - -  Sodium 135 - 145 mmol/L 140 140 131(L)  Potassium 3.5 - 5.1 mmol/L 5.0 5.3(H) 4.9  Chloride 98 - 111 mmol/L - - 100  CO2 22 - 32 mmol/L - - 23  Calcium 8.9 - 10.3 mg/dL - - 6.7(L)    ABG    Component Value Date/Time   PHART 7.386 11/17/2019 1527   PCO2ART 34.3 11/17/2019 1527   PO2ART 178 (H) 11/17/2019 1527   HCO3 20.4 11/17/2019 1527   TCO2 21 (L) 11/17/2019 1527   ACIDBASEDEF 3.0 (H) 11/17/2019 1527   O2SAT 100.0 11/17/2019 1527       [name], MD 11/17/2019 4:24 PM

## 2019-11-17 NOTE — Progress Notes (Addendum)
[facility] requesting CT heart to eval pericardium (not coronaries, therefore nitro/beta-blockers arent necessary)  Pt with allergy to contrast (lip swelling, mouth burning) >> 13 hr contrast allergy prep meds ordered and to be started tonight (11/17/19) at 20:00, last dose to be given 11/18/19 at 08:00 for a 9:00am scan time.  [facility] protocol:  13 hr prior to scan - 50mg  prednisone 7 hr prior to scan - 50mg  prednisone 1 hr prior to scan - 50mg  prednisone 1 hr prior to scan - 50mg  benadryl   Please ensure he maintains 18g peripheral IV for contrast injection for test tomorrow 11/18/19 at [facility] [phone] Office  [phone] Cell

## 2019-11-18 ENCOUNTER — Inpatient Hospital Stay (HOSPITAL_COMMUNITY): Payer: Medicare HMO

## 2019-11-18 DIAGNOSIS — I214 Non-ST elevation (NSTEMI) myocardial infarction: Secondary | ICD-10-CM | POA: Diagnosis not present

## 2019-11-18 DIAGNOSIS — I469 Cardiac arrest, cause unspecified: Secondary | ICD-10-CM | POA: Diagnosis not present

## 2019-11-18 DIAGNOSIS — N186 End stage renal disease: Secondary | ICD-10-CM | POA: Diagnosis not present

## 2019-11-18 DIAGNOSIS — R570 Cardiogenic shock: Secondary | ICD-10-CM | POA: Diagnosis not present

## 2019-11-18 DIAGNOSIS — J9601 Acute respiratory failure with hypoxia: Secondary | ICD-10-CM | POA: Diagnosis not present

## 2019-11-18 LAB — POCT I-STAT 7, (LYTES, BLD GAS, ICA,H+H)
Acid-base deficit: 7 mmol/L — ABNORMAL HIGH (ref 0.0–2.0)
Acid-base deficit: 4 mmol/L — ABNORMAL HIGH (ref 0.0–2.0)
Bicarbonate: 22 mmol/L (ref 20.0–28.0)
Bicarbonate: 22.5 mmol/L (ref 20.0–28.0)
Calcium, Ion: 1.06 mmol/L — ABNORMAL LOW (ref 1.15–1.40)
Calcium, Ion: 1.15 mmol/L (ref 1.15–1.40)
HCT: 24 % — ABNORMAL LOW (ref 39.0–52.0)
HCT: 30 % — ABNORMAL LOW (ref 39.0–52.0)
Hemoglobin: 8.2 g/dL — ABNORMAL LOW (ref 13.0–17.0)
Hemoglobin: 10.2 g/dL — ABNORMAL LOW (ref 13.0–17.0)
O2 Saturation: 100 %
O2 Saturation: 99 %
Patient temperature: 98.6
Patient temperature: 98.6
Potassium: 7.3 mmol/L (ref 3.5–5.1)
Potassium: 6.9 mmol/L (ref 3.5–5.1)
Sodium: 133 mmol/L — ABNORMAL LOW (ref 135–145)
Sodium: 134 mmol/L — ABNORMAL LOW (ref 135–145)
TCO2: 24 mmol/L (ref 22–32)
TCO2: 24 mmol/L (ref 22–32)
pCO2 arterial: 62.7 mmHg — ABNORMAL HIGH (ref 32.0–48.0)
pCO2 arterial: 47.4 mmHg (ref 32.0–48.0)
pH, Arterial: 7.153 — CL (ref 7.350–7.450)
pH, Arterial: 7.285 — ABNORMAL LOW (ref 7.350–7.450)
pO2, Arterial: 427 mmHg — ABNORMAL HIGH (ref 83.0–108.0)
pO2, Arterial: 172 mmHg — ABNORMAL HIGH (ref 83.0–108.0)

## 2019-11-18 LAB — RENAL FUNCTION PANEL
Albumin: 1.8 g/dL — ABNORMAL LOW (ref 3.5–5.0)
Anion gap: 10 (ref 5–15)
BUN: 80 mg/dL — ABNORMAL HIGH (ref 8–23)
CO2: 20 mmol/L — ABNORMAL LOW (ref 22–32)
Calcium: 7.9 mg/dL — ABNORMAL LOW (ref 8.9–10.3)
Chloride: 103 mmol/L (ref 98–111)
Creatinine, Ser: 6.27 mg/dL — ABNORMAL HIGH (ref 0.61–1.24)
GFR calc Af Amer: 10 mL/min — ABNORMAL LOW (ref >60)
GFR calc non Af Amer: 9 mL/min — ABNORMAL LOW (ref >60)
Glucose, Bld: 364 mg/dL — ABNORMAL HIGH (ref 70–99)
Phosphorus: 4.5 mg/dL (ref 2.5–4.6)
Potassium: 7.1 mmol/L (ref 3.5–5.1)
Sodium: 133 mmol/L — ABNORMAL LOW (ref 135–145)

## 2019-11-18 LAB — GLUCOSE, CAPILLARY
Glucose-Capillary: 305 mg/dL — ABNORMAL HIGH (ref 70–99)
Glucose-Capillary: 243 mg/dL — ABNORMAL HIGH (ref 70–99)

## 2019-11-18 LAB — COMPREHENSIVE METABOLIC PANEL
ALT: 104 U/L — ABNORMAL HIGH (ref 0–44)
AST: 113 U/L — ABNORMAL HIGH (ref 15–41)
Albumin: 1.5 g/dL — ABNORMAL LOW (ref 3.5–5.0)
Alkaline Phosphatase: 190 U/L — ABNORMAL HIGH (ref 38–126)
Anion gap: 11 (ref 5–15)
BUN: 85 mg/dL — ABNORMAL HIGH (ref 8–23)
CO2: 28 mmol/L (ref 22–32)
Calcium: 6.4 mg/dL — CL (ref 8.9–10.3)
Chloride: 99 mmol/L (ref 98–111)
Creatinine, Ser: 6.74 mg/dL — ABNORMAL HIGH (ref 0.61–1.24)
GFR calc Af Amer: 9 mL/min — ABNORMAL LOW (ref >60)
GFR calc non Af Amer: 8 mL/min — ABNORMAL LOW (ref >60)
Glucose, Bld: 403 mg/dL — ABNORMAL HIGH (ref 70–99)
Potassium: >7.5 mmol/L (ref 3.5–5.1)
Sodium: 138 mmol/L (ref 135–145)
Total Bilirubin: 1.3 mg/dL — ABNORMAL HIGH (ref 0.3–1.2)
Total Protein: 7.5 g/dL (ref 6.5–8.1)

## 2019-11-18 LAB — POTASSIUM
Potassium: >7.5 mmol/L (ref 3.5–5.1)
Potassium: 6 mmol/L — ABNORMAL HIGH (ref 3.5–5.1)
Potassium: 6.3 mmol/L (ref 3.5–5.1)

## 2019-11-18 LAB — CBC
HCT: 23.7 % — ABNORMAL LOW (ref 39.0–52.0)
Hemoglobin: 7 g/dL — ABNORMAL LOW (ref 13.0–17.0)
MCH: 29 pg (ref 26.0–34.0)
MCHC: 29.5 g/dL — ABNORMAL LOW (ref 30.0–36.0)
MCV: 98.3 fL (ref 80.0–100.0)
Platelets: 219 10*3/uL (ref 150–400)
RBC: 2.41 MIL/uL — ABNORMAL LOW (ref 4.22–5.81)
RDW: 20.8 % — ABNORMAL HIGH (ref 11.5–15.5)
WBC: 18 10*3/uL — ABNORMAL HIGH (ref 4.0–10.5)
nRBC: 0.2 % (ref 0.0–0.2)

## 2019-11-18 LAB — COOXEMETRY PANEL
Carboxyhemoglobin: 1.7 % — ABNORMAL HIGH (ref 0.5–1.5)
Methemoglobin: 1.5 % (ref 0.0–1.5)
O2 Saturation: 59.5 %
Total hemoglobin: 7.4 g/dL — ABNORMAL LOW (ref 12.0–16.0)

## 2019-11-18 LAB — PROTIME-INR
INR: 2.6 — ABNORMAL HIGH (ref 0.8–1.2)
Prothrombin Time: 28.1 seconds — ABNORMAL HIGH (ref 11.4–15.2)

## 2019-11-18 LAB — C DIFFICILE QUICK SCREEN W PCR REFLEX
C Diff antigen: NEGATIVE
C Diff interpretation: NOT DETECTED
C Diff toxin: NEGATIVE

## 2019-11-18 LAB — LIPASE, BLOOD: Lipase: 83 U/L — ABNORMAL HIGH (ref 11–51)

## 2019-11-18 LAB — LACTIC ACID, PLASMA
Lactic Acid, Venous: 2 mmol/L (ref 0.5–1.9)
Lactic Acid, Venous: 1.7 mmol/L (ref 0.5–1.9)

## 2019-11-18 LAB — TROPONIN I (HIGH SENSITIVITY): Troponin I (High Sensitivity): 72 ng/L — ABNORMAL HIGH (ref <18)

## 2019-11-18 LAB — MAGNESIUM: Magnesium: 2.8 mg/dL — ABNORMAL HIGH (ref 1.7–2.4)

## 2019-11-18 MED ORDER — HEPARIN SODIUM (PORCINE) 1000 UNIT/ML DIALYSIS
1000.0000 [IU] | INTRAMUSCULAR | Status: DC | PRN
  Administered 2019-11-23: 08:00:00 4000 [IU] via INTRAVENOUS_CENTRAL
  Filled 2019-11-18: qty 4
  Filled 2019-11-18 (×2): qty 6

## 2019-11-18 MED ORDER — PRISMASOL BGK 4/2.5 32-4-2.5 MEQ/L REPLACEMENT SOLN: Status: DC

## 2019-11-18 MED ORDER — FENTANYL 2500MCG IN NS 250ML (10MCG/ML) PREMIX INFUSION
25.0000 ug/h | INTRAVENOUS | Status: DC
  Administered 2019-11-18: 150 ug/h via INTRAVENOUS
  Administered 2019-11-19: 100 ug/h via INTRAVENOUS
  Administered 2019-11-20 – 2019-11-21 (×2): 75 ug/h via INTRAVENOUS
  Filled 2019-11-18 (×4): qty 250

## 2019-11-18 MED ORDER — PRISMASOL BGK 0/2.5 32-2.5 MEQ/L REPLACEMENT SOLN
Status: DC
  Filled 2019-11-18 (×8): qty 5000

## 2019-11-18 MED ORDER — ALTEPLASE 2 MG IJ SOLR: 2.0000 mg | Freq: Once | INTRAMUSCULAR | Status: DC | PRN

## 2019-11-18 MED ORDER — MIDAZOLAM HCL 2 MG/2ML IJ SOLN
1.0000 mg | INTRAMUSCULAR | Status: AC | PRN
  Administered 2019-11-18 (×3): 1 mg via INTRAVENOUS
  Filled 2019-11-18 (×3): qty 2

## 2019-11-18 MED ORDER — ROSUVASTATIN CALCIUM 5 MG PO TABS
10.0000 mg | ORAL_TABLET | Freq: Every day | ORAL | Status: DC
  Administered 2019-11-18 – 2019-11-25 (×8): 10 mg
  Filled 2019-11-18 (×8): qty 2

## 2019-11-18 MED ORDER — APIXABAN 5 MG PO TABS: 5.0000 mg | ORAL_TABLET | Freq: Two times a day (BID) | ORAL | Status: DC

## 2019-11-18 MED ORDER — RENA-VITE PO TABS
1.0000 | ORAL_TABLET | Freq: Every day | ORAL | Status: DC
  Administered 2019-11-18 – 2019-11-25 (×8): 1
  Filled 2019-11-18 (×9): qty 1

## 2019-11-18 MED ORDER — AMIODARONE HCL 200 MG PO TABS
200.0000 mg | ORAL_TABLET | Freq: Two times a day (BID) | ORAL | Status: DC
  Administered 2019-11-18 – 2019-11-25 (×15): 200 mg
  Filled 2019-11-18 (×15): qty 1

## 2019-11-18 MED ORDER — DEXTROSE 50 % IV SOLN
1.0000 | Freq: Once | INTRAVENOUS | Status: AC
  Administered 2019-11-18: 14:00:00 50 mL via INTRAVENOUS

## 2019-11-18 MED ORDER — POLYETHYLENE GLYCOL 3350 17 G PO PACK: 17.0000 g | PACK | Freq: Every day | ORAL | Status: DC

## 2019-11-18 MED ORDER — FENTANYL BOLUS VIA INFUSION
25.0000 ug | INTRAVENOUS | Status: DC | PRN
  Administered 2019-11-18 – 2019-11-21 (×12): 25 ug via INTRAVENOUS
  Filled 2019-11-18: qty 25

## 2019-11-18 MED ORDER — CALCIUM GLUCONATE-NACL 1-0.675 GM/50ML-% IV SOLN: 1.0000 g | INTRAVENOUS | Status: DC

## 2019-11-18 MED ORDER — DOCUSATE SODIUM 50 MG/5ML PO LIQD: 100.0000 mg | Freq: Two times a day (BID) | ORAL | Status: DC

## 2019-11-18 MED ORDER — MIDAZOLAM HCL 2 MG/2ML IJ SOLN
INTRAMUSCULAR | Status: AC
  Filled 2019-11-18: qty 2

## 2019-11-18 MED ORDER — HEPARIN SODIUM (PORCINE) 1000 UNIT/ML DIALYSIS
1000.0000 [IU] | INTRAMUSCULAR | Status: DC | PRN
  Filled 2019-11-18: qty 6

## 2019-11-18 MED ORDER — FENTANYL CITRATE (PF) 100 MCG/2ML IJ SOLN: 25.0000 ug | Freq: Once | INTRAMUSCULAR | Status: AC

## 2019-11-18 MED ORDER — SODIUM CHLORIDE 0.9 % FOR CRRT: INTRAVENOUS_CENTRAL | Status: DC | PRN

## 2019-11-18 MED ORDER — PRISMASOL BGK 4/2.5 32-4-2.5 MEQ/L IV SOLN: INTRAVENOUS | Status: DC

## 2019-11-18 MED ORDER — IOHEXOL 350 MG/ML SOLN
100.0000 mL | Freq: Once | INTRAVENOUS | Status: AC | PRN
  Administered 2019-11-18: 14:00:00 100 mL via INTRAVENOUS

## 2019-11-18 MED ORDER — MIDODRINE HCL 5 MG PO TABS
15.0000 mg | ORAL_TABLET | Freq: Three times a day (TID) | ORAL | Status: DC
  Administered 2019-11-18 – 2019-11-23 (×16): 15 mg
  Filled 2019-11-18 (×15): qty 3

## 2019-11-18 MED ORDER — CALCIUM CHLORIDE 10 % IV SOLN
1.0000 g | Freq: Once | INTRAVENOUS | Status: AC
  Administered 2019-11-18: 1 g via INTRAVENOUS

## 2019-11-18 MED ORDER — CLOPIDOGREL BISULFATE 75 MG PO TABS
75.0000 mg | ORAL_TABLET | Freq: Every day | ORAL | Status: DC
  Administered 2019-11-19 – 2019-11-25 (×7): 75 mg
  Filled 2019-11-18 (×7): qty 1

## 2019-11-18 MED ORDER — INSULIN ASPART 100 UNIT/ML IV SOLN
10.0000 [IU] | Freq: Once | INTRAVENOUS | Status: AC
  Administered 2019-11-18: 10 [IU] via INTRAVENOUS

## 2019-11-18 MED ORDER — PANTOPRAZOLE SODIUM 40 MG PO PACK
40.0000 mg | PACK | Freq: Every day | ORAL | Status: DC
  Administered 2019-11-18 – 2019-11-25 (×8): 40 mg
  Filled 2019-11-18 (×7): qty 20

## 2019-11-18 MED ORDER — HEPARIN SODIUM (PORCINE) 1000 UNIT/ML IJ SOLN
INTRAMUSCULAR | Status: AC
  Filled 2019-11-18: qty 4

## 2019-11-18 MED ORDER — DOCUSATE SODIUM 50 MG/5ML PO LIQD
100.0000 mg | Freq: Two times a day (BID) | ORAL | Status: DC
  Administered 2019-11-19: 100 mg
  Filled 2019-11-18: qty 10

## 2019-11-18 MED ORDER — MIDAZOLAM HCL 2 MG/2ML IJ SOLN
1.0000 mg | INTRAMUSCULAR | Status: DC | PRN
  Administered 2019-11-18 – 2019-11-19 (×6): 1 mg via INTRAVENOUS
  Filled 2019-11-18 (×6): qty 2

## 2019-11-18 MED FILL — Medication: Qty: 1 | Status: AC

## 2019-11-18 NOTE — Progress Notes (Signed)
CRITICAL VALUE ALERT  Critical Value:  K > 7  Date & Time Notied:  11/18/19 1328  Provider Notified: Eric Form NP  Orders Received/Actions taken: New orders received

## 2019-11-18 NOTE — Progress Notes (Addendum)
Patient ID: Patrick [name], male   DOB: Feb 28, 1956, 64 y.o.   MRN: [id number]     Advanced Heart Failure Rounding Note  PCP-Cardiologist: Dorris Carnes, MD     Patient Profile   64 y/o male w/ h/o CAD s/p remote CABG in 1997 w/ LIMA-LAD, failed kidney transplant, end-stage renal disease on dialysis admitted w/ CP. LHC w/ severe CAD. Echo w/ reduced EF 35-40% and moderate AI. S/p redo CABG x 3 on 4/1 (SVG-LAD, SVG-PDA, SVG-OM) + bioprosthetic AVR. Post operative course c/b by cardiogenic shock, respiratory failure, VT, and protein malnutrition.    Subjective:    Continues on pressors,  NE 8 and VP 0.03.  Co-ox 60%, CVP 11.    He is off CVVH now.  Awaiting iHD today.   He is a-pacing in the 90s, on amiodarone po.   No chest pain or dyspnea. Yesterday and this morning had episodes of watery diarrhea. Fever to 101.6 yesterday afternoon, has been afebrile since. WBCs 10.8.  Pancultured, sent for C difficile given recent antibiotic use.   Objective:   Weight Range: 98.7 kg Body mass index is 34.59 kg/m.   Vital Signs:   Temp:  [98.7 F (37.1 C)-101.6 F (38.7 C)] 98.7 F (37.1 C) (04/22 2000) Pulse Rate:  [87-105] 92 (04/23 0700) Resp:  [16-34] 22 (04/23 0747) BP: (109-120)/(49-58) 120/58 (04/23 0747) SpO2:  [62 %-100 %] 99 % (04/23 0700) Arterial Line BP: (58-141)/(36-58) 119/58 (04/23 0700) FiO2 (%):  [40 %] 40 % (04/23 0749) Last BM Date: 11/17/19  Weight change: Filed Weights   11/15/19 0500 11/16/19 0400 11/17/19 0402  Weight: 96.4 kg 96.6 kg 98.7 kg    Intake/Output:   Intake/Output Summary (Last 24 hours) at 11/18/2019 0851 Last data filed at 11/18/2019 0700 Gross per 24 hour  Intake 1785.12 ml  Output --  Net 1785.12 ml      Physical Exam    General: NAD, on Bipap Neck: JVP 10 cm , no thyromegaly or thyroid nodule.  Lungs: Clear to auscultation bilaterally with normal respiratory effort. CV: Nondisplaced PMI.  Heart regular S1/S2, no S3/S4, no murmur.  No  peripheral edema.   Abdomen: Soft, nontender, no hepatosplenomegaly, no distention.  Skin: Intact without lesions or rashes.  Neurologic: Alert and oriented x 3.  Psych: Normal affect. Extremities: No clubbing or cyanosis.  HEENT: Normal.    Telemetry   A-paced at 90 (personally reviewed).  Labs    CBC Recent Labs    11/17/19 0537 11/17/19 1517 11/17/19 1611 11/17/19 2108  WBC 11.7*  --  10.8*  --   HGB 7.5*   < > 7.3* 7.5*  HCT 25.1*   < > 24.1* 22.0*  MCV 95.8  --  94.9  --   PLT 162  --  175  --    < > = values in this interval not displayed.   Basic Metabolic Panel Recent Labs    11/15/19 1519 11/15/19 1519 11/16/19 0432 11/16/19 0432 11/17/19 0537 11/17/19 1517 11/17/19 1611 11/17/19 2108  NA 134*   < > 134*   < > 131*   < > 134* 139  K 4.3   < > 4.5   < > 4.9   < > 5.6* 5.2*  CL 103   < > 103   < > 100  --  104  --   CO2 26   < > 23   < > 23  --  21*  --   GLUCOSE  173*   < > 184*   < > 233*  --  154*  --   BUN 18   < > 24*   < > 50*  --  63*  --   CREATININE 2.05*   < > 2.14*   < > 4.03*  --  4.89*  --   CALCIUM 7.0*   < > 7.0*   < > 6.7*  --  6.3*  --   MG  --   --  2.3  --   --   --   --   --   PHOS 1.9*  --  1.7*  --   --   --   --   --    < > = values in this interval not displayed.   Liver Function Tests Recent Labs    11/15/19 1519 11/16/19 0432  ALBUMIN 1.7* 1.7*   No results for input(s): LIPASE, AMYLASE in the last 72 hours. Cardiac Enzymes No results for input(s): CKTOTAL, CKMB, CKMBINDEX, TROPONINI in the last 72 hours.  BNP: BNP (last 3 results) No results for input(s): BNP in the last 8760 hours.  ProBNP (last 3 results) No results for input(s): PROBNP in the last 8760 hours.   D-Dimer No results for input(s): DDIMER in the last 72 hours. Hemoglobin A1C No results for input(s): HGBA1C in the last 72 hours. Fasting Lipid Panel No results for input(s): CHOL, HDL, LDLCALC, TRIG, CHOLHDL, LDLDIRECT in the last 72  hours. Thyroid Function Tests No results for input(s): TSH, T4TOTAL, T3FREE, THYROIDAB in the last 72 hours.  Invalid input(s): FREET3  Other results:   Imaging    No results found.   Medications:     Scheduled Medications: . amiodarone  200 mg Oral BID  . apixaban  5 mg Oral BID  . chlorhexidine  15 mL Mouth Rinse BID  . Chlorhexidine Gluconate Cloth  6 each Topical Daily  . Chlorhexidine Gluconate Cloth  6 each Topical Q0600  . clopidogrel  75 mg Oral Daily  . darbepoetin (ARANESP) injection - NON-DIALYSIS  60 mcg Subcutaneous Q Sat-1800  . feeding supplement (ENSURE ENLIVE)  237 mL Oral TID BM  . feeding supplement (PRO-STAT SUGAR FREE 64)  30 mL Per Tube 5 X Daily  . feeding supplement (VITAL 1.5 CAL)  1,000 mL Per Tube Q24H  . heparin sodium (porcine)      . insulin aspart  0-9 Units Subcutaneous TID AC & HS  . magic mouthwash w/lidocaine  5 mL Oral QID  . mouth rinse  15 mL Mouth Rinse q12n4p  . midodrine  15 mg Oral TID WC  . multivitamin  1 tablet Oral QHS  . pantoprazole  40 mg Oral Daily  . rosuvastatin  10 mg Oral q1800  . sodium chloride flush  10-40 mL Intracatheter Q12H    Infusions: . sodium chloride 10 mL/hr at 11/18/19 0700  . sodium chloride    . sodium chloride    . norepinephrine (LEVOPHED) Adult infusion 8 mcg/min (11/18/19 0700)  . vasopressin (PITRESSIN) infusion - *FOR SHOCK* 0.03 Units/min (11/18/19 0700)    PRN Medications: sodium chloride, Place/Maintain arterial line **AND** sodium chloride, Place/Maintain arterial line **AND** sodium chloride, acetaminophen (TYLENOL) oral liquid 160 mg/5 mL, dextrose, fentaNYL (SUBLIMAZE) injection, Gerhardt's butt cream, levalbuterol, metoprolol tartrate, ondansetron (ZOFRAN) IV, oxyCODONE, sodium chloride flush, sodium chloride flush, traMADol   Assessment/Plan   1. Cardiogenic shock: Post-op.  Echo 4/14 with EF 30-35%, stable aortic valve replacement.  Echo on 4/21 with EF 45-50% and significant  respirophasic septal variation.  Pericardium in some places appears thickened though windows are poor.  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  Currently still on NE 8, vasopressin 0.03, midodrine 15 tid.  CVP 11 off CVVH, co-ox 60%. - Will try to evaluate pericardium more closely with gated CTA chest.  Has been premedicated for contrast allergy, CT this morning (will turn pacing down some to 70 bpm).  - Continue to slowly wean NE as tolerated, eventually vasopressin as well.  Will stay on midodrine.   - Plan to try iHD today. 2. ESRD: Resume iHD today.   3. Possible aspiration PNA: He has completed empiric vanco/meropenem.  - CXR today.  4. Atrial fibrillation: Paroxysmal.  Currently in NSR (a-paced) on po amiodarone.  - Continue amiodarone 200 mg bid.   - Continue apixaban. Monitor for bleeding.  5. Anemia, post-op: Hgb 7.4 today, transfuse < 7.  6. CAD: Redo CABG x 3 this admission on 4/1 (SVG-LAD, SVG-PDA, SVG-OM).   - Continue Plavix and Crestor.  7. VT:  Frequent NSVT, now on Amiodarone gtt improvement. - Keep K> 4.0 Mg > 2.0  - Off b-blocker with shock 8 Acute hypoxic respiratory failure: due to pulmonary edema and aspiration PNA - now off BIPAP during the day. Wears at night.  9. ID: Fever in afternoon yesterday, now afebrile.  WBCS 10.  Diarrhea yesterday and today.   - Cultured - C diff sent.   CRITICAL CARE Performed by: Loralie Champagne  Total critical care time: 35 minutes  Critical care time was exclusive of separately billable procedures and treating other patients.  Critical care was necessary to treat or prevent imminent or life-threatening deterioration.  Critical care was time spent personally by me on the following activities: development of treatment plan with patient and/or surrogate as well as nursing, discussions with consultants, evaluation of patient's response to treatment, examination of patient, obtaining  history from patient or surrogate, ordering and performing treatments and interventions, ordering and review of laboratory studies, ordering and review of radiographic studies, pulse oximetry and re-evaluation of patient's condition.  Length of Stay: [place], MD  11/18/2019, 8:51 AM  Advanced Heart Failure Team Pager [phone] (M-F; 7a - 4p)   Patient had respiratory arrest with PEA while in CT scanner, had 5 minutes CPR with drugs, achieved ROSC and was intubated.  K noted > 7.    CVVH restarted. CCM consulted.   CT chest reviewed, difficult images as the patient became unstable during the scan. There did not appear to be significant pericardial pathology.  There were moderate bilateral pleural effusions with atelectasis, very low lung volumes bilaterally left for gas exchange.    Loralie Champagne 11/18/2019 2:49 PM

## 2019-11-18 NOTE — Progress Notes (Signed)
PT Cancellation Note  Patient Details Name: Patrick [name] MRN: [id number] DOB: 12/26/1955   Cancelled Treatment:    Reason Eval/Treat Not Completed: Medical issues which prohibited therapy. Pt coded in CT. Will follow up next week.    [place] 11/18/2019, 11:18 AM [place] Pager [phone] Office [phone]

## 2019-11-18 NOTE — Progress Notes (Signed)
CRITICAL VALUE ALERT  Critical Value:  Lactic = 2.0  Date & Time Notied:  11/18/2019 1405   Provider Notified: Eric Form NP  Orders Received/Actions taken: No new orders at this time

## 2019-11-18 NOTE — Progress Notes (Signed)
This RN removed 2mg  versed for [facility] RN to administer to pt while in CT. Floor RN does not have pyxis access in CT. Handed off 2mg  versed to [facility].

## 2019-11-18 NOTE — Progress Notes (Addendum)
Dr. Prescott Gum was at bedside and as discussed with him, the  patient was scheduled for gated CT and due to contrast allergy, was pre medicated. While patient undergoing gated CT (to further evaluate pericardium) he had a PEA arrest. CPR done for approximately 3 minutes with ROSC and patient intubated as well. CT unable to be finished and patient transported back to [place] in stable condition.

## 2019-11-18 NOTE — Procedures (Signed)
Intubation Procedure Note Patrick [name] [id number] 1956/01/25  Procedure: Intubation Indications: Airway protection and maintenance  Procedure Details Consent: Unable to obtain consent because of emergent medical necessity. Time Out: Verified patient identification, verified procedure, site/side was marked, verified correct patient position, special equipment/implants available, medications/allergies/relevent history reviewed, required imaging and test results available.  Performed  Maximum sterile technique was used including cap, gloves and hand hygiene.  MAC and 3    Evaluation Hemodynamic Status: BP stable throughout; O2 sats: stable throughout Patient's Current Condition: stable Complications: No apparent complications Patient did tolerate procedure well. Chest X-ray ordered to verify placement.  CXR: pending.   Rudene Re 11/18/2019

## 2019-11-18 NOTE — Progress Notes (Signed)
CRITICAL VALUE ALERT  Critical Value:  K = 7.1  Date & Time Notied:  11/18/2019 1724  Provider Notified: Dr. Joelyn Oms  Orders Received/Actions taken: Change Replacement fluid type and rate. Frequent potassium checks

## 2019-11-18 NOTE — Progress Notes (Signed)
CRITICAL VALUE ALERT  Critical Value:  K > 7 Date & Time Notied:  11/18/2019 1225 Provider Notified: Eric Form NP  Orders Received/Actions taken: Will restart CRRT

## 2019-11-18 NOTE — Progress Notes (Signed)
Chaplain provided support to Mrs. [place] who was tearful in the [place] waiting area.  Chaplain offered presence and comfort as Mrs. Morrish processed her husband's current health state.   Chaplain will continue to follow-up.

## 2019-11-18 NOTE — Progress Notes (Signed)
Patient ID: Patrick [name], male   DOB: [birth year], 64 y.o.   MRN: [id number] T[facility] Evening Rounds:  Hemodynamically stable after PEA respiratory arrest today in CT requiring intubation. Weaning NE. Now on 27 mcg. Also on vaso 0.03.  K+ was 7.1 this afternoon. Back on CRRT.   BMET    Component Value Date/Time   NA 133 (L) 11/18/2019 1600   NA 139 11/30/2017 1008   K 7.1 (HH) 11/18/2019 1600   CL 103 11/18/2019 1600   CO2 20 (L) 11/18/2019 1600   GLUCOSE 364 (H) 11/18/2019 1600   BUN 80 (H) 11/18/2019 1600   BUN 48 (H) 11/30/2017 1008   CREATININE 6.27 (H) 11/18/2019 1600   CALCIUM 7.9 (L) 11/18/2019 1600   GFRNONAA 9 (L) 11/18/2019 1600   GFRAA 10 (L) 11/18/2019 1600   CBC    Component Value Date/Time   WBC 18.0 (H) 11/18/2019 1101   RBC 2.41 (L) 11/18/2019 1101   HGB 10.2 (L) 11/18/2019 1517   HGB 11.9 (L) 11/30/2017 1008   HCT 30.0 (L) 11/18/2019 1517   HCT 35.3 (L) 11/30/2017 1008   PLT 219 11/18/2019 1101   PLT 197 11/30/2017 1008   MCV 98.3 11/18/2019 1101   MCV 92 11/30/2017 1008   MCH 29.0 11/18/2019 1101   MCHC 29.5 (L) 11/18/2019 1101   RDW 20.8 (H) 11/18/2019 1101   RDW 16.8 (H) 11/30/2017 1008   LYMPHSABS 1.0 10/24/2019 0737   MONOABS 0.7 10/24/2019 0737   EOSABS 0.2 10/24/2019 0737   BASOSABS 0.1 10/24/2019 0737

## 2019-11-18 NOTE — Consult Note (Signed)
NAME:  Patrick [name], MRN:  [id number], DOB:  Mar 03, 1956, LOS: 27 ADMISSION DATE:  10/24/2019, CONSULTATION DATE:  11/17/2020 REFERRING MD:  Kirk Ruths, CHIEF COMPLAINT:  Respiratory Arrest 4/23   Brief History   64 y/o male w/ h/o CAD, HTN, HLD, DM, ESRD.CABG x 1 1997, failed kidney transplant,end-stage renal disease on dialysis admitted 3/29  w/ CP, NSTEMI . He underwent  re-do CABG x 3  on 4/1. Post operative course c/b by cardiogenic shock, respiratory failure, VT,illius, and protein malnutrition. Pt. Had respiratory /  PEA arrest in CT on 4/23 requiring intubation. PCCM have been asked to consult  for respiratory failure/ ventilation management post arrest.  History of present illness   64 y/o male w/ h/o CAD, HTN, HLD, DM, ESRD.CABG x 1 1997, failed kidney transplant,end-stage renal disease on dialysis admitted 3/29  w/ CP, NSTEMI . He underwent  re-do CABG x 3   on 4/1. Post operative course c/b by cardiogenic shock, respiratory failure, VT,illius, and protein malnutrition.  He had  respiratory and PEA arrest on 4/23 while in CT scan. CPR was started, and the patient was intubated. He received  CPR x 5 minutes, ACLS medications  included 2 amps  of epi, one amp Bicarb and 1 amp calcium, when ROSC was achieved. He was neurologically intact post arrest.He had been on BiPAP prior to arrest with pulmonary edema and suspicion of aspiration pneumonia. PCCM have been asked to consult for respiratory failure and ventilator management  Pt had low lung volumes prior to arrest most likely 2/2 illeus and abdominal pain post CABG.  Additionally suspect undiagnosed OSA/OHS as contributing factor.  Past Medical History  CAD, HTN, HLD, DM, ESRD.  NSTEMI 10/25/2019 10/25/2019: LHC w/ severe CAD. Echo w/ reduced EF 35-40% and moderate AI. CABG Re-Do x 3 on 10/27/2019(SVG-LAD, SVG-PDA, SVG-OM).    RCA PCI 01/2019 Atrial Flutter s/p ablation 2018 CABG 1997 w / LIMA-LAD Combined systolic/ Diastolic Heart  Failure Failed kidney transplant,end-stage renal disease on dialysis   Significant Hospital Events   3/29 Admission  4/1 CABG x 3 ( Re-do) 4/23 Respiratory / PEA  Arrest  4/23 Intubation and PCCM consult   Consults:  3/29 Renal Consult 3/30 TCTS Consult 4/23 PCCM  Procedures:  4/1 CABG x 3 (SVG-LAD, SVG-PDA, SVG-OM).  Significant Diagnostic Tests:  11/18/2019 CXR Intubated, endotracheal tube tip in good position. Otherwise stable lines and tubes. Improved lung volumes since 0907 hours today.  Small bilateral pleural effusions with compressive lung base atelectasis better demonstrated on the CT images today 1045 hours.  Echo 4/14 with EF 30-35%, stable aortic valve replacement.    Echo 4/21 with EF 45-50% and significant respirophasic septal variation.  Pericardium in some places appears thickened though windows are poor.  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  Cath 10/25/2019 . Severe 3 vessel CAD    - CTO of the mid LAD after the first diagonal    - 90% ostial LCx    - 100% mid RCA in stent. Left to right collaterals. 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 Atretic LIMA graft Mildly elevated LVEDP Early restenosis/occlusion of the RCA. This vessel is diffusely diseased and heavily calcified. The ostial LCx appears new compared to prior.  Percutaneous options are very limited Micro Data:  4/14 Blood>> No growth 4/11 Blood >> No Growth 4/8  Respiratory>> Normal Flora 4/8/ Blood >> No Growth 3/29 SARS Coronavirus 2 >> Negative Antimicrobials:  Vanc 4/11-4/17 Meropenem  4/11-4/17  Interim history/subjective:  Pt is intubated and sedated. He was following commands and neurologically intact post arrest.  T max 101.6 K was 7.3 at last check  Received 1 amp D50, 10 units insulin IV and 1 amp calcium in Radiology post ROSC CVVH is being initiated 4/23 through existing tunneled cath Remains on  Levophed at [place] ABG:  02/08/61.7/427/22 on 100% Na 138 K > 7.5 was treated in radiology after ROSC Creatinine 6.74 Troponin 72 Alk Phos 190 Lipase 83 AST 113 ALT 104 Ionized calcium 1.06 Lactate 2.0 WBC 18 HGB 8.2 Meth HGB 1.5  Net Negative 4.4 L per flow sheet  Objective   Blood pressure (!) 120/58, pulse 93, temperature (!) 97.5 F (36.4 C), temperature source Axillary, resp. rate (!) 26, height 5' 6.5" (1.689 m), weight 98.7 kg, SpO2 98 %.    Vent Mode: PRVC FiO2 (%):  [40 %-100 %] 100 % Set Rate:  [15 bmp-18 bmp] 18 bmp Vt Set:  [510 mL] 510 mL PEEP:  [5 cmH20] 5 cmH20 Plateau Pressure:  [18 cmH20] 18 cmH20   Intake/Output Summary (Last 24 hours) at 11/18/2019 1137 Last data filed at 11/18/2019 1100 Gross per 24 hour  Intake 1610.81 ml  Output --  Net 1610.81 ml   Filed Weights   11/15/19 0500 11/16/19 0400 11/17/19 0402  Weight: 96.4 kg 96.6 kg 98.7 kg    Examination: General: Critically ill male supine in bed, intubated and sedated HENT: NCAT, Oral ETT, Cor Track , No LAD, thick neck Lungs: Bilateral chest excursion, Coarse throughout with rhonchi, diminished per bases, secretions are tan to blood tinged Cardiovascular: S1, S2, No S3, S4, RRR, No RMG, SR with depressed ST per tele Abdomen: Distended, NT, BS diminished, Obese, Pacer wires noted Extremities: Warm to touch, No obvious deformities, brisl refill noted Neuro: MAE x 4, Nods appropriately, follows simple commands  GU: Anuric  Resolved Hospital Problem list     Assessment & Plan:  Acute Respiratory Failure / PEA Arrest while in CT 4/23 Hx. Of Pulmonary Edema /  Suspected Aspiration  Several days prior CXR 4/23  Improved lung volumes since 0907 hours today.  Small bilateral pleural effusions with compressive lung base atelectasis  Suspect low lung volumes prior to arrest most likely 2/2 illeus and abdominal pain post CABG.  Additionally suspect undiagnosed OSA/OHS as  contributing factor. Plan ABG now and in 2 hours ( 1500) Wean FiO2 and PEEP for sats of > 94% in setting of CAD Trend CXR 4/24, and prn  Monitor small effusions for change Sputum Culture now Fentanyl with Versed pushes for sedation, RASS goal -1 ABG in am 4/24 Goal to wean and extubate as soon as able  Consider BiPAP at HS once extubated Will need OP follow up to assess for OSA at discharge  Hyperkalemia ESRD K > 7.3 after Code Treated once post code, but K remains > 7.5 Plan Repeat Serum K now  If remains > 7 give D 50 I amp/ then IV insulin 10 units IV, Then 1 amp calcium chloride Start CVVHD ASAP Appreciate renal assist Repeat K at 1500 , and prn until normalizes Trend BMET in am   Hypotension post arrest Cardiogenic shock Plan Titrate/ wean  Levophed for MAP goal of > 65 mm Hg Rest per Primary Team  Afebrile WBC 18,000 ? Reactive CXR shows compressive atelelctasis Plan Will monitor off antibiotics for now Trend WBC and fever curve Culture Sputum 4/23 Follow micro Re-culture  as is clinically indicated  Anemia Plan Trend CBC Monitor for bleeding Transfuse for HGB < 8 ( Recent CABG)  Diarrhea Concern for ileus Plan Resume Trickle TF for now Increase to foal 4/24/if he is tolerating Send Stool for C diff   Best practice:  Diet: Trickle TF Pain/Anxiety/Delirium protocol (if indicated): Fentanyl with prn Versed VAP protocol (if indicated): Initiated DVT prophylaxis: Eliquis GI prophylaxis: Protonix Glucose control: CBG with SSI Mobility: BR Code Status: Full Family Communication: Family updated at bedside Disposition: ICU  Labs   CBC: Recent Labs  Lab 11/15/19 0500 11/15/19 0500 11/16/19 0432 11/16/19 0432 11/17/19 0537 11/17/19 0537 11/17/19 1517 11/17/19 1527 11/17/19 1611 11/17/19 2108 11/18/19 1101  WBC 7.6  --  8.1  --  11.7*  --   --   --  10.8*  --  18.0*  HGB 8.1*   < > 7.7*   < > 7.5*   < > 7.8* 16.3 7.3* 7.5* 7.0*  HCT 26.5*    < > 25.9*   < > 25.1*   < > 23.0* 48.0 24.1* 22.0* 23.7*  MCV 93.6  --  95.6  --  95.8  --   --   --  94.9  --  98.3  PLT 119*  --  137*  --  162  --   --   --  175  --  219   < > = values in this interval not displayed.    Basic Metabolic Panel: Recent Labs  Lab 11/12/19 0331 11/12/19 1809 11/13/19 0321 11/13/19 3299 11/14/19 0428 11/14/19 1223 11/14/19 1818 11/14/19 1818 11/15/19 0500 11/15/19 0500 11/15/19 1519 11/15/19 1519 11/16/19 0432 11/16/19 0432 11/17/19 0537 11/17/19 1517 11/17/19 1527 11/17/19 1611 11/17/19 2108  NA 136   < > 133*   < > 133*   < > 133*   < > 132*   < > 134*   < > 134*   < > 131* 140 140 134* 139  K 4.2   < > 4.2   < > 4.5   < > 4.2   < > 4.3   < > 4.3   < > 4.5   < > 4.9 5.3* 5.0 5.6* 5.2*  CL 102   < > 102   < > 99  --  102   < > 101  --  103  --  103  --  100  --   --  104  --   CO2 24   < > 25   < > 25  --  24   < > 23  --  26  --  23  --  23  --   --  21*  --   GLUCOSE 155*   < > 167*   < > 154*  --  188*   < > 180*  --  173*  --  184*  --  233*  --   --  154*  --   BUN 12   < > 16   < > 18  --  19   < > 20  --  18  --  24*  --  50*  --   --  63*  --   CREATININE 1.97*   < > 2.18*   < > 1.97*  --  1.98*   < > 2.07*  --  2.05*  --  2.14*  --  4.03*  --   --  4.89*  --  CALCIUM 7.1*   < > 6.9*   < > 7.2*  --  7.0*   < > 6.9*  --  7.0*  --  7.0*  --  6.7*  --   --  6.3*  --   MG 2.2  --  2.4  --  2.4  --   --   --  2.3  --   --   --  2.3  --   --   --   --   --   --   PHOS 2.3*   < > 2.7   < > 2.1*  --  1.6*  --  1.5*  --  1.9*  --  1.7*  --   --   --   --   --   --    < > = values in this interval not displayed.   GFR: Estimated Creatinine Clearance: 16.7 mL/min (A) (by C-G formula based on SCr of 4.89 mg/dL (H)). Recent Labs  Lab 11/16/19 0432 11/17/19 0537 11/17/19 1611 11/18/19 1101  WBC 8.1 11.7* 10.8* 18.0*    Liver Function Tests: Recent Labs  Lab 11/14/19 0428 11/14/19 1818 11/15/19 0500 11/15/19 1519 11/16/19 0432   ALBUMIN 1.8* 1.7* 1.7* 1.7* 1.7*   No results for input(s): LIPASE, AMYLASE in the last 168 hours. No results for input(s): AMMONIA in the last 168 hours.  ABG    Component Value Date/Time   PHART 7.402 11/17/2019 2108   PCO2ART 36.0 11/17/2019 2108   PO2ART 129 (H) 11/17/2019 2108   HCO3 22.5 11/17/2019 2108   TCO2 24 11/17/2019 2108   ACIDBASEDEF 2.0 11/17/2019 2108   O2SAT 59.5 11/18/2019 0501     Coagulation Profile: Recent Labs  Lab 11/18/19 1101  INR 2.6*    Cardiac Enzymes: No results for input(s): CKTOTAL, CKMB, CKMBINDEX, TROPONINI in the last 168 hours.  HbA1C: Hemoglobin A1C  Date/Time Value Ref Range Status  06/07/2019 10:54 AM 6.4 (A) 4.0 - 5.6 % Final   Hgb A1c MFr Bld  Date/Time Value Ref Range Status  10/27/2019 04:51 AM 6.2 (H) 4.8 - 5.6 % Final    Comment:    (NOTE) Pre diabetes:          5.7%-6.4% Diabetes:              >6.4% Glycemic control for   <7.0% adults with diabetes   08/20/2018 07:23 AM 6.9 (H) 4.8 - 5.6 % Final    Comment:    (NOTE) Pre diabetes:          5.7%-6.4% Diabetes:              >6.4% Glycemic control for   <7.0% adults with diabetes     CBG: Recent Labs  Lab 11/17/19 1008 11/17/19 1132 11/17/19 1524 11/17/19 2114 11/17/19 2116  GLUCAP 165* 148* 131* 158* 160*    Review of Systems:   Unable as patient is intubated and sedated  Past Medical History  He,  has a past medical history of Arthritis, Atrial flutter ([place]), Coronary artery disease, Erectile dysfunction, ESRD (end stage renal disease) on dialysis ([place]), Essential hypertension, Gout, Hernia of abdominal wall, History of blood transfusion, History of cardiomyopathy, History of kidney stones, History of pneumonia, Migraine, Myocardial infarction ([place]), Pneumonia, Secondary hyperparathyroidism ([place]), Type 2 diabetes mellitus ([place]), and Wears glasses.   Surgical History    Past Surgical History:  Procedure Laterality Date  . A-FLUTTER ABLATION N/A  09/24/2016   Procedure: A-Flutter Ablation;  Surgeon: Will Meredith Leeds, MD;  Location: [facility];  Service: Cardiovascular;  Laterality: N/A;  . ABDOMINAL AORTOGRAM W/LOWER EXTREMITY N/A 04/13/2017   Procedure: ABDOMINAL AORTOGRAM W/LOWER EXTREMITY;  Surgeon: Conrad Killeen, MD;  Location: [facility];  Service: Cardiovascular;  Laterality: N/A;  Bilater lower extermity  . AORTIC VALVE REPLACEMENT N/A 10/27/2019   Procedure: AORTIC VALVE REPLACEMENT (AVR) USING EDWARDS INTUITY 23 MM AORTIC VALVE.;  Surgeon: Wonda Olds, MD;  Location: [facility];  Service: Open Heart Surgery;  Laterality: N/A;  . APPENDECTOMY    . AV FISTULA PLACEMENT Right 09/18/2014   Procedure: INSERTION OF ARTERIOVENOUS (AV) GORE-TEX GRAFT ARM USING 4-7MM  X 45CM STRETCH GORE-TEX VASCULAR GRAFT;  Surgeon: Rosetta Posner, MD;  Location: [facility];  Service: Vascular;  Laterality: Right;  . AV FISTULA PLACEMENT Left 07/07/2016   Procedure: INSERTION OF LEFT BRACHIAL TO AXILLARY ARTERIOVENOUS (AV) GORE-TEX ARM GRAFT;  Surgeon: Conrad Colonia, MD;  Location: [facility];  Service: Vascular;  Laterality: Left;  . AV FISTULA PLACEMENT Left 12/09/2018   Procedure: Insertion Of Arteriovenous (Av) Gore-Tex Graft Arm, left arm;  Surgeon: Marty Heck, MD;  Location: [facility];  Service: Vascular;  Laterality: Left;  . CARDIAC CATHETERIZATION  ~ 2016  . COLONOSCOPY    . 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 CORONARY ARTERY BYPASS GRAFT N/A 10/27/2019   Procedure: REDO CORONARY ARTERY BYPASS GRAFTING TIMES 3  USING LEFT GREATER SAPHENOUS LEG VEIN HARVESTED ENDOSCOPICALLY(CABG);  Surgeon: Wonda Olds, MD;  Location: [facility];  Service: Open Heart Surgery;  Laterality: N/A;  . CORONARY ATHERECTOMY N/A 02/16/2019   Procedure: CORONARY ATHERECTOMY;  Surgeon: Martinique, Peter M, MD;  Location: [facility];  Service: Cardiovascular;  Laterality: N/A;  .  ENDOVEIN HARVEST OF GREATER SAPHENOUS VEIN Left 10/27/2019   Procedure: [place] Of Greater Saphenous Vein;  Surgeon: Wonda Olds, MD;  Location: [place] [facility];  Service: Open Heart Surgery;  Laterality: Left;  . EXCHANGE OF A DIALYSIS CATHETER Left 01/11/2018   Procedure: EXCHANGE OF TUNNELED DIALYSIS CATHETER;  Surgeon: Rosetta Posner, MD;  Location: [facility];  Service: Vascular;  Laterality: Left;  . HERNIA REPAIR  2017   with nephrectomy  . INSERTION OF DIALYSIS CATHETER N/A 10/08/2017   Procedure: INSERTION OF TUNNELED DIALYSIS CATHETER;  Surgeon: Conrad , MD;  Location: [facility];  Service: Vascular;  Laterality: N/A;  . IR FLUORO GUIDE CV LINE LEFT  03/12/2018  . IR FLUORO GUIDE CV LINE RIGHT  10/31/2019  . IR GENERIC HISTORICAL  05/11/2016   IR FLUORO GUIDE CV LINE LEFT 05/11/2016 Corrie Mckusick, DO [facility]  . IR GENERIC HISTORICAL  05/11/2016   IR US GUIDE VASC ACCESS LEFT 05/11/2016 Corrie Mckusick, DO [facility]  . IR GENERIC HISTORICAL  05/11/2016   IR US GUIDE VASC ACCESS RIGHT 05/11/2016 Corrie Mckusick, DO [facility]  . IR GENERIC HISTORICAL  05/11/2016   IR RADIOLOGY PERIPHERAL GUIDED IV START 05/11/2016 Corrie Mckusick, DO [facility]  . IR PTA VENOUS EXCEPT DIALYSIS CIRCUIT  10/31/2019  . IR US GUIDE VASC ACCESS RIGHT  10/31/2019  . KIDNEY TRANSPLANT  2009  . LEFT HEART CATH AND CORONARY ANGIOGRAPHY N/A 02/15/2019   Procedure: LEFT HEART CATH AND CORONARY ANGIOGRAPHY;  Surgeon: Martinique, Peter M, MD;  Location: [facility];  Service: Cardiovascular;  Laterality: N/A;  .  LEFT HEART CATH AND CORS/GRAFTS ANGIOGRAPHY N/A 12/04/2017   Procedure: LEFT HEART CATH AND CORS/GRAFTS ANGIOGRAPHY;  Surgeon: Belva Crome, MD;  Location: [facility];  Service: Cardiovascular;  Laterality: N/A;  . LEFT HEART CATH AND CORS/GRAFTS ANGIOGRAPHY N/A 10/25/2019   Procedure: LEFT HEART CATH AND CORS/GRAFTS ANGIOGRAPHY;  Surgeon: Martinique, Peter M, MD;  Location: [facility];   Service: Cardiovascular;  Laterality: N/A;  . NEPHRECTOMY  2017   transplant rejected   . PERIPHERAL VASCULAR CATHETERIZATION N/A 06/04/2016   Procedure: Upper Extremity Venography;  Surgeon: Waynetta Sandy, MD;  Location: [facility];  Service: Cardiovascular;  Laterality: N/A;  . PERIPHERAL VASCULAR INTERVENTION  04/13/2017   Procedure: PERIPHERAL VASCULAR INTERVENTION;  Surgeon: Conrad [place], MD;  Location: [facility];  Service: Cardiovascular;;  Lt. Common/Exernal  Iliac  . TEE WITHOUT CARDIOVERSION N/A 10/27/2019   Procedure: TRANSESOPHAGEAL ECHOCARDIOGRAM (TEE);  Surgeon: Wonda Olds, MD;  Location: [facility];  Service: Open Heart Surgery;  Laterality: N/A;  . THROMBECTOMY AND REVISION OF ARTERIOVENTOUS (AV) GORETEX  GRAFT Left 10/08/2017   Procedure: THROMBECTOMY of ARTERIOVENTOUS (AV) GORETEX  GRAFT LEFT UPPER ARM;  Surgeon: Conrad Ucon, MD;  Location: [facility];  Service: Vascular;  Laterality: Left;  . THROMBECTOMY W/ EMBOLECTOMY Left 09/14/2017   Procedure: THROMBECTOMY ARTERIOVENOUS GORE-TEX GRAFT LEFT UPPER ARM;  Surgeon: Rosetta Posner, MD;  Location: [facility];  Service: Vascular;  Laterality: Left;  . UPPER EXTREMITY ANGIOGRAPHY Bilateral 10/29/2018   Procedure: UPPER EXTREMITY ANGIOGRAPHY;  Surgeon: Marty Heck, MD;  Location: [facility];  Service: Cardiovascular;  Laterality: Bilateral;  . UPPER EXTREMITY VENOGRAPHY N/A 11/16/2017   Procedure: UPPER EXTREMITY VENOGRAPHY - Right Arm;  Surgeon: Conrad Strawberry, MD;  Location: [facility];  Service: Cardiovascular;  Laterality: N/A;  . UPPER EXTREMITY VENOGRAPHY Bilateral 10/29/2018   Procedure: UPPER EXTREMITY VENOGRAPHY;  Surgeon: Marty Heck, MD;  Location: [facility];  Service: Cardiovascular;  Laterality: Bilateral;     Social History   reports that he quit smoking about 5 years ago. His smoking use included cigarettes. He has never used smokeless tobacco. He reports current  alcohol use. He reports that he does not use drugs.   Family History   His family history includes HIV in his sister; Hyperlipidemia in his mother; Hypertension in his mother.   Allergies Allergies  Allergen Reactions  . Baclofen Other (See Comments)    Possibly stroke like symptoms  . Iodinated Diagnostic Agents Swelling, Rash and Other (See Comments)    Other Reaction: burning to mouth, swelling of lips  . Lipitor [Atorvastatin] Other (See Comments)    Leg pain  . Metoprolol Other (See Comments)    Headaches      Home Medications  Prior to Admission medications   Medication Sig Start Date End Date Taking? Authorizing Provider  acetaminophen (TYLENOL) 500 MG tablet Take 1,000 mg by mouth every 6 (six) hours as needed for moderate pain or headache.   Yes [provider]  albuterol (PROVENTIL HFA;VENTOLIN HFA) 108 (90 Base) MCG/ACT inhaler Inhale 1-2 puffs into the lungs every 6 (six) hours as needed for wheezing or shortness of breath. 12/18/17  Yes Richardson Dopp T, PA-C  aspirin EC 81 MG tablet Take 81 mg by mouth daily.    Yes [provider]  calcium acetate (PHOSLO) 667 MG capsule Take 1,334-2,001 mg by mouth See admin instructions. 3 capsules with  dinner and 2 capsules with snacks   Yes [provider]  calcium carbonate (OS-CAL - DOSED IN MG OF ELEMENTAL CALCIUM) 1250 (500 Ca) MG tablet Take 2 tablets by mouth daily.  11/29/13  Yes [provider]  clopidogrel (PLAVIX) 75 MG tablet TAKE 1 TABLET(75 MG) BY MOUTH DAILY Patient taking differently: Take 75 mg by mouth daily.  03/03/19  Yes Richardson Dopp T, PA-C  Colchicine 0.6 MG CAPS Take 0.6 mg by mouth See admin instructions. Take 0.6 mg when first symptoms of gout flare occur, then take another 0.6 mg 14 days later as needed for gout 10/13/18  Yes [provider]  dextromethorphan (DELSYM) 30 MG/5ML liquid Take 30 mg by mouth as needed for cough.   Yes [provider]   Dextromethorphan-guaiFENesin 5-100 MG/5ML LIQD Take 15 mLs by mouth 2 (two) times daily as needed (Cough).  06/03/18  Yes [provider]  docusate sodium (COLACE) 100 MG capsule Take 100 mg by mouth daily as needed for mild constipation.   Yes [provider]  ezetimibe (ZETIA) 10 MG tablet TAKE 1 TABLET(10 MG) BY MOUTH DAILY Patient taking differently: Take 10 mg by mouth daily.  05/09/19  Yes Weaver, Scott T, PA-C  isosorbide mononitrate (IMDUR) 30 MG 24 hr tablet Take 1 tablet (30 mg total) by mouth daily. 02/24/19  Yes Daune Perch, NP  lidocaine-prilocaine (EMLA) cream Apply 1 application topically daily as needed (Numbing).  03/21/19  Yes [provider]  metoprolol succinate (TOPROL XL) 25 MG 24 hr tablet Take 1 tablet (25 mg total) by mouth daily. 05/05/19  Yes Daune Perch, NP  multivitamin (RENA-VIT) TABS tablet Take 1 tablet by mouth daily.    Yes [provider]  neomycin-bacitracin-polymyxin (NEOSPORIN) ointment Apply 1 application topically 2 (two) times daily. Apply to feet.   Yes [provider]  nitroGLYCERIN (NITROSTAT) 0.4 MG SL tablet Place 1 tablet (0.4 mg total) under the tongue every 5 (five) minutes as needed for chest pain. 09/16/19 12/15/19 Yes Fay Records, MD  rosuvastatin (CRESTOR) 10 MG tablet Take 1 tablet (10 mg total) by mouth daily. 02/24/19  Yes Daune Perch, NP  silver sulfADIAZINE (SILVADENE) 1 % cream Apply 1 application topically daily. Patient taking differently: Apply 1 application topically 2 (two) times daily.  10/18/19  Yes Trula Slade, DPM  traMADol (ULTRAM) 50 MG tablet Take 50 mg by mouth 2 (two) times daily as needed for moderate pain.  05/19/18  Yes [provider]  doxycycline (VIBRA-TABS) 100 MG tablet Take 1 tablet (100 mg total) by mouth 2 (two) times daily. Patient not taking: Reported on 10/24/2019 09/06/19   Trula Slade, DPM  Methoxy PEG-Epoetin Beta (MIRCERA IJ) Mircera  09/05/19 09/03/20  [provider]  oxyCODONE-acetaminophen (PERCOCET/ROXICET) 5-325 MG tablet Take 1 tablet by mouth every 6 (six) hours as needed. Patient not taking: Reported on 10/24/2019 12/09/18   Ulyses Amor, PA-C  pantoprazole (PROTONIX) 40 MG tablet Take 1 tablet (40 mg total) by mouth daily. 11/17/19   Fay Records, MD  repaglinide (PRANDIN) 0.5 MG tablet Take 1 tablet (0.5 mg total) by mouth 2 (two) times daily before a meal. OVERDUE FOR AN APPT. WILL PROVIDE 30 DAY SUPPLY ONLY. MUST CALL OFFICE 11/03/19   Renato Shin, MD     Critical care APP time: 7 minutes    Magdalen Spatz, MSN, AGACNP-BC [place] Pager # [phone] After 4 pm please call [phone] 11/18/2019 2:11  PM

## 2019-11-18 NOTE — Progress Notes (Signed)
10:35 Pt taken to CT for test accompanied by transport and RN. Connected to Non-rebreather mask. Tolerated well. No s/s of distress. Vital signs remains stable. Pt awake and responsive.  10:50 Pt lethargic and difficult to arouse. Blood pressure declining despite increase in titration of levophed. Breathing agonal and pt became bradycardic. Rapid response called and Code blue activated. CPR initiated with establishment of ROSC within less than 5 minutes, pt intubated.   11:15 Pt transported back to unit. VSS at this time with no s/s of distress. CCM at bedside to evaluate pt and update of plan of care.

## 2019-11-18 NOTE — Progress Notes (Signed)
[facility] Progress Note  Subjective: +1.7 L yest. Was on bipap yest evening for SOB. Wt's up a bit to 98kg.  Pt had PEA arrest late this morning in CT.    Vitals:   11/18/19 0800 11/18/19 0900 11/18/19 1300 11/18/19 1503  BP:    (!) 131/50  Pulse: 92 93  65  Resp: (!) 21 (!) 26    Temp:  (!) 97.5 F (36.4 C) 98.7 F (37.1 C)   TempSrc:  Axillary Axillary   SpO2: 100% 98%    Weight:      Height:        Exam:  alert, chron ill appearing   No jvd  Chest cta ant/lat   Cor reg     Abd obese ntnd    Ext no leg edema, 1+ bilat uE edema    NF, Ox3,gen weakness      Dialysis: [name] MWF 4h 99.5 2K/3.5Ca P4 TDC Hep 3000 - mircera 75 last 3/22 - hect 1  Assessment/ Plan: 1. NSTEMI/CAD - s/p redo CABG 01/03/61 complicated by shock 2. Cardiogenic shock - on vaso/ levo gtt's, per cardiology. Getting CTA of chest to look at pericardium tomorrow (+contrast allergy).  3. Resp failure - d/t pulm edema and asp PNA, improved. Using bipap qhs. 4. Volume - 1 kg under old dry wt, CVP 9, inc'd SOB overnight, was on bipap yest evening. Will resume CRRT, get vol down as tolerated.  5. ESRD - HD MWF.  CRRT started 4/1 heredue to refractory hypotension and vol excess. Was off CRRT yest. Pt now sp arrest. Will resume CRRT, UF 1-2 L per 24 hrs as tol.  6. Atrial Fibrillation- on amiodarone IV  7. Anemia abl/ ckd - tsat 16%, ferr 1293. StartedAranesp60 qwk SQ 1st dose 4/10.If Hb continues to drop will need blood transfusion. 8. S/p AVR- good valve function per ECHO. 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 SP IV sodium phos 10. Protein malnutrition- severe with low albumin. On cortrak TF's and also is taking po per the wife.     Patrick [name] 11/18/2019, 3:31 PM   Recent Labs  Lab 11/15/19 1519 11/15/19 1519 11/16/19 0432 11/17/19 0537 11/17/19 1611 11/17/19 2108 11/18/19 1101 11/18/19 1101 11/18/19 1141 11/18/19 1141 11/18/19 1234  11/18/19 1517  K 4.3   < > 4.5   < > 5.6*   < > >7.5*   < > 7.3*   < > >7.5* 6.9*  BUN 18   < > 24*   < > 63*  --  85*  --   --   --   --   --   CREATININE 2.05*   < > 2.14*   < > 4.89*  --  6.74*  --   --   --   --   --   CALCIUM 7.0*   < > 7.0*   < > 6.3*  --  6.4*  --   --   --   --   --   PHOS 1.9*  --  1.7*  --   --   --   --   --   --   --   --   --   HGB  --   --  7.7*   < > 7.3*   < > 7.0*   < > 8.2*  --   --  10.2*   < > = values in this interval not displayed.  Inpatient medications: . amiodarone  200 mg Oral BID  . apixaban  5 mg Oral BID  . chlorhexidine  15 mL Mouth Rinse BID  . Chlorhexidine Gluconate Cloth  6 each Topical Daily  . Chlorhexidine Gluconate Cloth  6 each Topical Q0600  . clopidogrel  75 mg Oral Daily  . darbepoetin (ARANESP) injection - NON-DIALYSIS  60 mcg Subcutaneous Q Sat-1800  . docusate  100 mg Oral BID  . feeding supplement (ENSURE ENLIVE)  237 mL Oral TID BM  . feeding supplement (PRO-STAT SUGAR FREE 64)  30 mL Per Tube 5 X Daily  . feeding supplement (VITAL 1.5 CAL)  1,000 mL Per Tube Q24H  . fentaNYL (SUBLIMAZE) injection  25 mcg Intravenous Once  . heparin sodium (porcine)      . insulin aspart  0-9 Units Subcutaneous TID AC & HS  . magic mouthwash w/lidocaine  5 mL Oral QID  . mouth rinse  15 mL Mouth Rinse q12n4p  . midazolam      . midodrine  15 mg Oral TID WC  . multivitamin  1 tablet Oral QHS  . pantoprazole  40 mg Oral Daily  . polyethylene glycol  17 g Oral Daily  . rosuvastatin  10 mg Oral q1800  . sodium chloride flush  10-40 mL Intracatheter Q12H   .  prismasol BGK 4/2.5 400 mL/hr at 11/18/19 1410  .  prismasol BGK 4/2.5 200 mL/hr at 11/18/19 1410  . sodium chloride 10 mL/hr at 11/18/19 1000  . sodium chloride    . sodium chloride    . fentaNYL infusion INTRAVENOUS 200 mcg/hr (11/18/19 1217)  . norepinephrine (LEVOPHED) Adult infusion 40 mcg/min (11/18/19 1220)  . prismasol BGK 4/2.5 1,800 mL/hr at 11/18/19 1410  .  vasopressin (PITRESSIN) infusion - *FOR SHOCK* 0.03 Units/min (11/18/19 1000)   sodium chloride, Place/Maintain arterial line **AND** sodium chloride, Place/Maintain arterial line **AND** sodium chloride, acetaminophen (TYLENOL) oral liquid 160 mg/5 mL, alteplase, dextrose, fentaNYL, fentaNYL (SUBLIMAZE) injection, Gerhardt's butt cream, heparin, levalbuterol, metoprolol tartrate, midazolam, ondansetron (ZOFRAN) IV, oxyCODONE, sodium chloride, sodium chloride flush, sodium chloride flush, traMADol

## 2019-11-18 NOTE — Code Documentation (Signed)
  Patient Name: Patrick [name]   MRN: [id number]   Date of Birth/ Sex: 05-10-1956 , male      Admission Date: 10/24/2019  Attending Provider: Wonda Olds, MD  Primary Diagnosis: NSTEMI (non-ST elevated myocardial infarction) [facility])   Indication: Pt was in his usual state of health until this AM, when he was noted to be PEA. Code blue was subsequently called. At the time of arrival on scene, ACLS protocol was underway.   Technical Description:  - CPR performance duration:  5 minutes  - Was defibrillation or cardioversion used? No  - Was external pacer placed? No  - Was patient intubated pre/post CPR? Yes   Medications Administered: Y = Yes; Blank = No Amiodarone    Atropine    Calcium  y  Epinephrine  y  Lidocaine    Magnesium    Norepinephrine    Phenylephrine    Sodium bicarbonate  y  Vasopressin     Post CPR evaluation:  - Final Status - Was patient successfully resuscitated ? Yes - What is current rhythm? Sinus tachycardia - What is current hemodynamic status? Intubated  Miscellaneous Information:  - Labs sent, including:  CBC, CMP, troponin, lipase, troponin, lactate, ABG, magnesium, EKG, CXR  - Primary team notified?  Yes  - Family Notified? No  - Additional notes/ transfer status:  Transferred to 2H04. Dr. Marigene Ehlers at the bedside     Earlene Plater, MD Internal Medicine, PGY1 Pager: [phone]  11/18/2019,12:57 PM

## 2019-11-19 ENCOUNTER — Inpatient Hospital Stay (HOSPITAL_COMMUNITY): Payer: Medicare HMO

## 2019-11-19 DIAGNOSIS — I214 Non-ST elevation (NSTEMI) myocardial infarction: Secondary | ICD-10-CM | POA: Diagnosis not present

## 2019-11-19 DIAGNOSIS — N186 End stage renal disease: Secondary | ICD-10-CM | POA: Diagnosis not present

## 2019-11-19 DIAGNOSIS — I469 Cardiac arrest, cause unspecified: Secondary | ICD-10-CM | POA: Diagnosis not present

## 2019-11-19 DIAGNOSIS — R570 Cardiogenic shock: Secondary | ICD-10-CM | POA: Diagnosis not present

## 2019-11-19 LAB — GLUCOSE, CAPILLARY
Glucose-Capillary: 212 mg/dL — ABNORMAL HIGH (ref 70–99)
Glucose-Capillary: 203 mg/dL — ABNORMAL HIGH (ref 70–99)
Glucose-Capillary: 230 mg/dL — ABNORMAL HIGH (ref 70–99)
Glucose-Capillary: 197 mg/dL — ABNORMAL HIGH (ref 70–99)

## 2019-11-19 LAB — CBC
HCT: 22.5 % — ABNORMAL LOW (ref 39.0–52.0)
Hemoglobin: 6.9 g/dL — CL (ref 13.0–17.0)
MCH: 29.1 pg (ref 26.0–34.0)
MCHC: 30.7 g/dL (ref 30.0–36.0)
MCV: 94.9 fL (ref 80.0–100.0)
Platelets: 249 10*3/uL (ref 150–400)
RBC: 2.37 MIL/uL — ABNORMAL LOW (ref 4.22–5.81)
RDW: 21.2 % — ABNORMAL HIGH (ref 11.5–15.5)
WBC: 15.2 10*3/uL — ABNORMAL HIGH (ref 4.0–10.5)
nRBC: 0.7 % — ABNORMAL HIGH (ref 0.0–0.2)

## 2019-11-19 LAB — RENAL FUNCTION PANEL
Albumin: 1.7 g/dL — ABNORMAL LOW (ref 3.5–5.0)
Albumin: 1.6 g/dL — ABNORMAL LOW (ref 3.5–5.0)
Anion gap: 9 (ref 5–15)
Anion gap: 11 (ref 5–15)
BUN: 54 mg/dL — ABNORMAL HIGH (ref 8–23)
BUN: 41 mg/dL — ABNORMAL HIGH (ref 8–23)
CO2: 23 mmol/L (ref 22–32)
CO2: 23 mmol/L (ref 22–32)
Calcium: 7.6 mg/dL — ABNORMAL LOW (ref 8.9–10.3)
Calcium: 7.4 mg/dL — ABNORMAL LOW (ref 8.9–10.3)
Chloride: 104 mmol/L (ref 98–111)
Chloride: 100 mmol/L (ref 98–111)
Creatinine, Ser: 4 mg/dL — ABNORMAL HIGH (ref 0.61–1.24)
Creatinine, Ser: 2.85 mg/dL — ABNORMAL HIGH (ref 0.61–1.24)
GFR calc Af Amer: 17 mL/min — ABNORMAL LOW (ref >60)
GFR calc Af Amer: 26 mL/min — ABNORMAL LOW (ref >60)
GFR calc non Af Amer: 15 mL/min — ABNORMAL LOW (ref >60)
GFR calc non Af Amer: 22 mL/min — ABNORMAL LOW (ref >60)
Glucose, Bld: 220 mg/dL — ABNORMAL HIGH (ref 70–99)
Glucose, Bld: 228 mg/dL — ABNORMAL HIGH (ref 70–99)
Phosphorus: 3.6 mg/dL (ref 2.5–4.6)
Phosphorus: 2.7 mg/dL (ref 2.5–4.6)
Potassium: 5.5 mmol/L — ABNORMAL HIGH (ref 3.5–5.1)
Potassium: 4.8 mmol/L (ref 3.5–5.1)
Sodium: 136 mmol/L (ref 135–145)
Sodium: 134 mmol/L — ABNORMAL LOW (ref 135–145)

## 2019-11-19 LAB — COMPREHENSIVE METABOLIC PANEL
ALT: 101 U/L — ABNORMAL HIGH (ref 0–44)
AST: 86 U/L — ABNORMAL HIGH (ref 15–41)
Albumin: 1.7 g/dL — ABNORMAL LOW (ref 3.5–5.0)
Alkaline Phosphatase: 198 U/L — ABNORMAL HIGH (ref 38–126)
Anion gap: 8 (ref 5–15)
BUN: 54 mg/dL — ABNORMAL HIGH (ref 8–23)
CO2: 24 mmol/L (ref 22–32)
Calcium: 7.5 mg/dL — ABNORMAL LOW (ref 8.9–10.3)
Chloride: 104 mmol/L (ref 98–111)
Creatinine, Ser: 3.72 mg/dL — ABNORMAL HIGH (ref 0.61–1.24)
GFR calc Af Amer: 19 mL/min — ABNORMAL LOW (ref >60)
GFR calc non Af Amer: 16 mL/min — ABNORMAL LOW (ref >60)
Glucose, Bld: 222 mg/dL — ABNORMAL HIGH (ref 70–99)
Potassium: 5.5 mmol/L — ABNORMAL HIGH (ref 3.5–5.1)
Sodium: 136 mmol/L (ref 135–145)
Total Bilirubin: 1.6 mg/dL — ABNORMAL HIGH (ref 0.3–1.2)
Total Protein: 8.7 g/dL — ABNORMAL HIGH (ref 6.5–8.1)

## 2019-11-19 LAB — HEMOGLOBIN AND HEMATOCRIT, BLOOD
HCT: 22.3 % — ABNORMAL LOW (ref 39.0–52.0)
Hemoglobin: 6.8 g/dL — CL (ref 13.0–17.0)

## 2019-11-19 LAB — POCT I-STAT 7, (LYTES, BLD GAS, ICA,H+H)
Acid-Base Excess: 1 mmol/L (ref 0.0–2.0)
Bicarbonate: 26 mmol/L (ref 20.0–28.0)
Calcium, Ion: 1.03 mmol/L — ABNORMAL LOW (ref 1.15–1.40)
HCT: 25 % — ABNORMAL LOW (ref 39.0–52.0)
Hemoglobin: 8.5 g/dL — ABNORMAL LOW (ref 13.0–17.0)
O2 Saturation: 99 %
Patient temperature: 98.3
Potassium: 5.6 mmol/L — ABNORMAL HIGH (ref 3.5–5.1)
Sodium: 139 mmol/L (ref 135–145)
TCO2: 27 mmol/L (ref 22–32)
pCO2 arterial: 43.8 mmHg (ref 32.0–48.0)
pH, Arterial: 7.38 (ref 7.350–7.450)
pO2, Arterial: 124 mmHg — ABNORMAL HIGH (ref 83.0–108.0)

## 2019-11-19 LAB — POTASSIUM: Potassium: 6.2 mmol/L — ABNORMAL HIGH (ref 3.5–5.1)

## 2019-11-19 LAB — COOXEMETRY PANEL
Carboxyhemoglobin: 1.8 % — ABNORMAL HIGH (ref 0.5–1.5)
Methemoglobin: 1.3 % (ref 0.0–1.5)
O2 Saturation: 62.6 %
Total hemoglobin: 7 g/dL — ABNORMAL LOW (ref 12.0–16.0)

## 2019-11-19 LAB — PREPARE RBC (CROSSMATCH)

## 2019-11-19 LAB — APTT: aPTT: 38 seconds — ABNORMAL HIGH (ref 24–36)

## 2019-11-19 LAB — LACTIC ACID, PLASMA: Lactic Acid, Venous: 1.1 mmol/L (ref 0.5–1.9)

## 2019-11-19 LAB — MAGNESIUM: Magnesium: 2.6 mg/dL — ABNORMAL HIGH (ref 1.7–2.4)

## 2019-11-19 MED ORDER — SODIUM CHLORIDE 0.9% IV SOLUTION: Freq: Once | INTRAVENOUS | Status: AC

## 2019-11-19 MED ORDER — DIPHENHYDRAMINE HCL 50 MG/ML IJ SOLN
12.5000 mg | Freq: Four times a day (QID) | INTRAMUSCULAR | Status: DC | PRN
  Administered 2019-11-19 – 2019-11-20 (×3): 12.5 mg via INTRAVENOUS
  Filled 2019-11-19 (×3): qty 1

## 2019-11-19 MED ORDER — DIPHENHYDRAMINE HCL 50 MG/ML IJ SOLN
INTRAMUSCULAR | Status: AC
  Administered 2019-11-19: 12.5 mg via INTRAVENOUS
  Filled 2019-11-19: qty 1

## 2019-11-19 NOTE — Progress Notes (Signed)
Patient ID: Patrick [name], male   DOB: 1956-06-23, 64 y.o.   MRN: [id number]     Advanced Heart Failure Rounding Note  PCP-Cardiologist: Dorris Carnes, MD     Patient Profile   64 y/o male w/ h/o CAD s/p remote CABG in 1997 w/ LIMA-LAD, failed kidney transplant, end-stage renal disease on dialysis admitted w/ CP. LHC w/ severe CAD. Echo w/ reduced EF 35-40% and moderate AI. S/p redo CABG x 3 on 4/1 (SVG-LAD, SVG-PDA, SVG-OM) + bioprosthetic AVR. Post operative course c/b by cardiogenic shock, respiratory failure, VT, and protein malnutrition.    Subjective:    Patient had respiratory arrest with PEA while in CT scanner yesterday., had 5 minutes CPR with drugs, achieved ROSC and was intubated.  K noted > 7.    CVVH restarted. Pulling -75-100. CVP down to 6-7  Awake on vent. Was on PS trial this afternoon but developed apnea. Follows command.   On NE 20 VP 0.03. Co-ox 63%   Objective:   Weight Range: 98.7 kg Body mass index is 34.59 kg/m.   Vital Signs:   Temp:  [97.4 F (36.3 C)-98.3 F (36.8 C)] 98.1 F (36.7 C) (04/24 1123) Pulse Rate:  [59-94] 92 (04/24 1300) Resp:  [4-26] 9 (04/24 1300) BP: (91-131)/(48-59) 91/48 (04/24 1214) SpO2:  [93 %-100 %] 98 % (04/24 1300) Arterial Line BP: (86-150)/(41-61) 86/48 (04/24 1300) FiO2 (%):  [40 %-50 %] 40 % (04/24 1214) Last BM Date: 11/18/19  Weight change: Filed Weights   11/15/19 0500 11/16/19 0400 11/17/19 0402  Weight: 96.4 kg 96.6 kg 98.7 kg    Intake/Output:   Intake/Output Summary (Last 24 hours) at 11/19/2019 1341 Last data filed at 11/19/2019 1300 Gross per 24 hour  Intake 1808.1 ml  Output 2996 ml  Net -1187.9 ml      Physical Exam    General:  Awake on vent HEENT: normal + ETT Neck: supple. no JVD. Carotids 2+ bilat; no bruits. No lymphadenopathy or thryomegaly appreciated. Cor: PMI nondisplaced. Regular rate & rhythm. No rubs, gallops or murmurs. Lungs: coarse Abdomen: soft, nontender, nondistended. No  hepatosplenomegaly. No bruits or masses. Good bowel sounds. Extremities: no cyanosis, clubbing, rash, edema Neuro: awake on vent. Following commands  Telemetry   A-paced at 90-94 underlying sinus brady/junctional 40-50s (personally reviewed).  Labs    CBC Recent Labs    11/17/19 1611 11/17/19 2108 11/18/19 1101 11/18/19 1141 11/18/19 1517 11/19/19 0426  WBC 10.8*  --  18.0*  --   --   --   HGB 7.3*   < > 7.0*   < > 10.2* 8.5*  HCT 24.1*   < > 23.7*   < > 30.0* 25.0*  MCV 94.9  --  98.3  --   --   --   PLT 175  --  219  --   --   --    < > = values in this interval not displayed.   Basic Metabolic Panel Recent Labs    11/18/19 1600 11/18/19 2028 11/19/19 0342 11/19/19 0342 11/19/19 0352 11/19/19 0426  NA 133*  --  136   < > 136 139  K 7.1*   < > 5.5*   < > 5.5* 5.6*  CL 103  --  104  --  104  --   CO2 20*  --  24  --  23  --   GLUCOSE 364*  --  222*  --  220*  --   BUN 80*  --  54*  --  54*  --   CREATININE 6.27*  --  3.72*  --  4.00*  --   CALCIUM 7.9*  --  7.5*  --  7.6*  --   MG 2.8*  --  2.6*  --   --   --   PHOS 4.5  --   --   --  3.6  --    < > = values in this interval not displayed.   Liver Function Tests Recent Labs    11/18/19 1101 11/18/19 1600 11/19/19 0342 11/19/19 0352  AST 113*  --  86*  --   ALT 104*  --  101*  --   ALKPHOS 190*  --  198*  --   BILITOT 1.3*  --  1.6*  --   PROT 7.5  --  8.7*  --   ALBUMIN 1.5*   < > 1.7* 1.7*   < > = values in this interval not displayed.   Recent Labs    11/18/19 1101  LIPASE 83*   Cardiac Enzymes No results for input(s): CKTOTAL, CKMB, CKMBINDEX, TROPONINI in the last 72 hours.  BNP: BNP (last 3 results) No results for input(s): BNP in the last 8760 hours.  ProBNP (last 3 results) No results for input(s): PROBNP in the last 8760 hours.   D-Dimer No results for input(s): DDIMER in the last 72 hours. Hemoglobin A1C No results for input(s): HGBA1C in the last 72 hours. Fasting Lipid Panel  No results for input(s): CHOL, HDL, LDLCALC, TRIG, CHOLHDL, LDLDIRECT in the last 72 hours. Thyroid Function Tests No results for input(s): TSH, T4TOTAL, T3FREE, THYROIDAB in the last 72 hours.  Invalid input(s): FREET3  Other results:   Imaging    CT CORONARY MORPH W/CTA COR W/SCORE W/CA W/CM &/OR WO/CM  Addendum Date: 11/18/2019   ADDENDUM REPORT: 11/18/2019 14:45 CLINICAL DATA:  ?Pericardial disease EXAM: Cardiac CTA MEDICATIONS: No additional medications. TECHNIQUE: The patient was pre-medicated for contrast allergy. the patient was scanned on a Siemens 188 slice scanner. Gantry rotation speed was 250 msecs. Collimation was 0.6 mm. A 100 kV prospective scan was triggered in the ascending thoracic aorta at 35-75% of the R-R interval. Average HR during the scan was 70 bpm. The 3D data set was interpreted on a dedicated work station using MPR, MIP and VRT modes. A total of 80cc of contrast was used. FINDINGS: Non-cardiac: See separate report from [facility]. Difficult images as patient became unstable during the study. There is a bioprosthetic aortic valve and patient is s/p CABG. Contrast bolus is not timed well enough to comment on patency of coronaries. Images are difficult due to contrast timing, but there does not appear to be significant pericardial pathology. Bilateral pleural effusions and atelectasis as per the [facility] report. IMPRESSION: Difficult images. There does not appear to be significant pericardial pathology. Dalton Mclean Electronically Signed   By: Loralie Champagne M.D.   On: 11/18/2019 14:45   Result Date: 11/18/2019 EXAM: OVER-READ INTERPRETATION  CT CHEST The following report is an over-read performed by radiologist Dr. Kerby Moors of [facility], [place] on 11/18/2019. This over-read does not include interpretation of cardiac or coronary anatomy or pathology. The coronary CTA interpretation by the cardiologist is attached. COMPARISON:  None.  FINDINGS: Vascular: Postoperative changes from median sternotomy and CABG procedure. The heart size appears moderately enlarged. No significant pericardial fluid collection the identified. Aortic atherosclerosis. Mediastinum/Nodes: Dilated esophagus containing a weighted feeding tube is identified. The visualized  portions of the trachea are unremarkable. No mass or adenopathy. Lungs/Pleura: Moderate to large bilateral pleural effusions are identified. The left pleural effusion appears partially loculated with loculated fluid along the paramediastinal left upper lobe. There is compressive type atelectasis and consolidation associated with bilateral pleural effusions with considerable reduction in lung volumes bilaterally. Upper Abdomen: No acute abnormality. Musculoskeletal: Sternotomy defect is again noted. The cerclage wires are intact. No acute or suspicious osseous findings. Diffuse body wall edema. IMPRESSION: 1. Moderate to large bilateral pleural effusions are identified. The left pleural effusion appears partially loculated with loculated fluid along the paramediastinal left upper lobe. 2. There is compressive type atelectasis and consolidation associated with bilateral pleural effusions with considerable reduction in aerated lung volumes bilaterally. 3. Aortic atherosclerosis. Aortic Atherosclerosis (ICD10-I70.0). Electronically Signed: By: Kerby Moors M.D. On: 11/18/2019 13:23   DG CHEST PORT 1 VIEW  Result Date: 11/19/2019 CLINICAL DATA:  Respiratory failure. EXAM: PORTABLE CHEST 1 VIEW COMPARISON:  November 18, 2019 FINDINGS: Stable right PICC line. Stable left central line. The ETT is in good position. The feeding tube terminates below today's film. A left-sided transcutaneous pacer is again identified. No pneumothorax. Stable cardiomediastinal silhouette. Opacity and effusion in the right base is improved. Opacity and probable associated effusion on the left is partially obscured but similar in the  interval. Suspect mild pulmonary venous congestion. IMPRESSION: 1. Support apparatus as above. 2. Probable mild pulmonary venous congestion. 3. Improving opacity and effusion in the right base. Similar opacity and effusion in the left base. Electronically Signed   By: Dorise Bullion III M.D   On: 11/19/2019 11:57     Medications:     Scheduled Medications: . amiodarone  200 mg Per Tube BID  . chlorhexidine  15 mL Mouth Rinse BID  . Chlorhexidine Gluconate Cloth  6 each Topical Daily  . Chlorhexidine Gluconate Cloth  6 each Topical Q0600  . clopidogrel  75 mg Per Tube Daily  . darbepoetin (ARANESP) injection - NON-DIALYSIS  60 mcg Subcutaneous Q Sat-1800  . docusate  100 mg Per Tube BID  . feeding supplement (ENSURE ENLIVE)  237 mL Oral TID BM  . feeding supplement (PRO-STAT SUGAR FREE 64)  30 mL Per Tube 5 X Daily  . feeding supplement (VITAL 1.5 CAL)  1,000 mL Per Tube Q24H  . insulin aspart  0-9 Units Subcutaneous TID AC & HS  . mouth rinse  15 mL Mouth Rinse q12n4p  . midodrine  15 mg Per Tube TID WC  . multivitamin  1 tablet Per Tube QHS  . pantoprazole sodium  40 mg Per Tube Daily  . rosuvastatin  10 mg Per Tube q1800  . sodium chloride flush  10-40 mL Intracatheter Q12H    Infusions: .  prismasol BGK 4/2.5 400 mL/hr at 11/18/19 1410  . sodium chloride 10 mL/hr at 11/19/19 1300  . sodium chloride    . sodium chloride    . fentaNYL infusion INTRAVENOUS 100 mcg/hr (11/19/19 1300)  . norepinephrine (LEVOPHED) Adult infusion 20 mcg/min (11/19/19 1335)  . prismasol BGK 0/2.5 300 mL/hr at 11/19/19 1339  . prismasol BGK 4/2.5 1,800 mL/hr at 11/19/19 1206  . vasopressin (PITRESSIN) infusion - *FOR SHOCK* 0.03 Units/min (11/19/19 1300)    PRN Medications: sodium chloride, Place/Maintain arterial line **AND** sodium chloride, Place/Maintain arterial line **AND** sodium chloride, acetaminophen (TYLENOL) oral liquid 160 mg/5 mL, alteplase, dextrose, diphenhydrAMINE, fentaNYL,  fentaNYL (SUBLIMAZE) injection, Gerhardt's butt cream, heparin, levalbuterol, metoprolol tartrate, midazolam, ondansetron (ZOFRAN) IV, oxyCODONE,  sodium chloride, sodium chloride flush, sodium chloride flush   Assessment/Plan   1. Cardiogenic shock: Post-op.  Echo 4/14 with EF 30-35%, stable aortic valve replacement.  Echo on 4/21 with EF 45-50% and significant respirophasic septal variation.  Pericardium in some places appears thickened though windows are poor.  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 CT chest 4/23, difficult images as the patient became unstable during the scan. There did not appear to be significant pericardial pathology. S/p PEA arrest 4/23. Now on NE 20 VP 0.03, midodrine 15 tid.  CVP 6-7 co-ox 63% - Wean pressors slowly as tolerated  Will stay on midodrine.   - Continue CVVHD at -75-100 to facilitate extubation  2. ESRD:  - continue CVVHD as above 3. Possible aspiration PNA: He has completed empiric vanco/meropenem.  - CXR today.  4. Atrial fibrillation: Paroxysmal.  Has underlying sinus brady/junctional. Now a-paced at 90.  - Continue amiodarone 200 mg bid for now. May have to hold - Now off apixaban after arrest. Not sure why he is not on heparin. Will d/w TCTS 5. Anemia, post-op:  - Hgb 6.9 today - Will give 1u RBCs 6. CAD: Redo CABG x 3 this admission on 4/1 (SVG-LAD, SVG-PDA, SVG-OM).   - no s/s ischemia - Continue Plavix and Crestor.  7. VT:  Frequent NSVT, now on Amiodarone gtt improvement. - Keep K> 4.0 Mg > 2.0  - Off b-blocker with shock 8 Acute hypoxic respiratory failure: due to pulmonary edema and aspiration PNA - re-intubated after PEA arrest 4/23 - continue to wean vent as tolerated - has bilateral effusions. May need drainage - D/w CCM  9. ID: Tm [id number] - Cultures NGTD - C diff sent.  10. PEA arrest on 4/23 - resuscitated with CPR  CRITICAL CARE Performed by: Glori Bickers  Total critical  care time: 35 minutes  Critical care time was exclusive of separately billable procedures and treating other patients.  Critical care was necessary to treat or prevent imminent or life-threatening deterioration.  Critical care was time spent personally by me on the following activities: development of treatment plan with patient and/or surrogate as well as nursing, discussions with consultants, evaluation of patient's response to treatment, examination of patient, obtaining history from patient or surrogate, ordering and performing treatments and interventions, ordering and review of laboratory studies, ordering and review of radiographic studies, pulse oximetry and re-evaluation of patient's condition.  Length of Stay: [place], MD  11/19/2019, 1:41 PM  Advanced Heart Failure Team Pager [phone] (M-F; 7a - 4p)

## 2019-11-19 NOTE — Progress Notes (Signed)
CRITICAL VALUE ALERT  Critical Value: Hgb 6.9  Date & Time Notied: 11/19/19@1620   Provider Notified: Dr Cyndia Bent  Orders Received/Actions taken: redraw H/H for comparison

## 2019-11-19 NOTE — Progress Notes (Signed)
[facility] Progress Note  Subjective: K+ up yest around 7, now down at 5.6.  Tolerating CRRT , net neg 650 cc today so far.   Vitals:   11/19/19 1000 11/19/19 1123 11/19/19 1200 11/19/19 1214  BP:    (!) 91/48  Pulse: 93  93   Resp: 15  13 10   Temp:  98.1 F (36.7 C)    TempSrc:  Axillary    SpO2: 94%  100%   Weight:      Height:        Exam:  alert, chron ill appearing, on vent   No jvd  Chest cta ant/lat   Cor reg     Abd obese ntnd    Ext no leg edema, 1+ bilat uE edema    NF, Ox3,gen weakness      Dialysis: [name] MWF 4h 99.5 2K/3.5Ca P4 TDC Hep 3000 - mircera 75 last 3/22 - hect 1  Assessment/ Plan: 1. NSTEMI/CAD - s/p redo CABG 11/27/64 complicated by shock 2. Cardiogenic shock - on vaso/ levo gtt's, per cardiology 3. Resp failure - d/t pulm edema and prior asp PNA. On vent after resp arrest yesterday in CT.  4. Volume - slightly under dry wt, suspect vol excess. UF goal 1-2 L net neg per day.  5. ESRD - HD MWF.  CRRT started 4/1 heredue to refractory hypotension and vol excess. Was off CRRT 4/21-22. Back on 4/23. Continue.  6. Atrial Fibrillation- per cardiology 7. Anemia abl/ ckd - tsat 16%, ferr 1293. StartedAranesp60 qwk SQ 1st dose 4/10.If Hb continues to drop will need blood transfusion. 8. S/p AVR- good valve function per ECHO. 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 SP IV sodium phos 10. Protein malnutrition- severe with low albumin. On cortrak TF's.   Patrick [name] 11/19/2019, 1:11 PM   Recent Labs  Lab 11/18/19 1101 11/18/19 1517 11/18/19 1600 11/18/19 2028 11/19/19 0342 11/19/19 0342 11/19/19 0352 11/19/19 0426  K   < > 6.9* 7.1*   < > 5.5*   < > 5.5* 5.6*  BUN   < >  --  80*  --  54*  --  54*  --   CREATININE   < >  --  6.27*  --  3.72*  --  4.00*  --   CALCIUM   < >  --  7.9*  --  7.5*  --  7.6*  --   PHOS  --   --  4.5  --   --   --  3.6  --   HGB  --  10.2*  --   --   --   --   --  8.5*   < > = values in this interval not displayed.   Inpatient medications: . amiodarone  200 mg Per Tube BID  . chlorhexidine  15 mL Mouth Rinse BID  . Chlorhexidine Gluconate Cloth  6 each Topical Daily  . Chlorhexidine Gluconate Cloth  6 each Topical Q0600  . clopidogrel  75 mg Per Tube Daily  . darbepoetin (ARANESP) injection - NON-DIALYSIS  60 mcg Subcutaneous Q Sat-1800  . docusate  100 mg Per Tube BID  . feeding supplement (ENSURE ENLIVE)  237 mL Oral TID BM  . feeding supplement (PRO-STAT SUGAR FREE 64)  30 mL Per Tube 5 X Daily  . feeding supplement (VITAL 1.5 CAL)  1,000 mL Per Tube Q24H  . insulin aspart  0-9 Units Subcutaneous TID AC &  HS  . mouth rinse  15 mL Mouth Rinse q12n4p  . midodrine  15 mg Per Tube TID WC  . multivitamin  1 tablet Per Tube QHS  . pantoprazole sodium  40 mg Per Tube Daily  . rosuvastatin  10 mg Per Tube q1800  . sodium chloride flush  10-40 mL Intracatheter Q12H   .  prismasol BGK 4/2.5 400 mL/hr at 11/18/19 1410  . sodium chloride 10 mL/hr at 11/19/19 1300  . sodium chloride    . sodium chloride    . fentaNYL infusion INTRAVENOUS 100 mcg/hr (11/19/19 1300)  . norepinephrine (LEVOPHED) Adult infusion 15 mcg/min (11/19/19 1300)  . prismasol BGK 0/2.5 400 mL/hr at 11/19/19 4462  . prismasol BGK 4/2.5 1,800 mL/hr at 11/19/19 1206  . vasopressin (PITRESSIN) infusion - *FOR SHOCK* 0.03 Units/min (11/19/19 1300)   sodium chloride, Place/Maintain arterial line **AND** sodium chloride, Place/Maintain arterial line **AND** sodium chloride, acetaminophen (TYLENOL) oral liquid 160 mg/5 mL, alteplase, dextrose, diphenhydrAMINE, fentaNYL, fentaNYL (SUBLIMAZE) injection, Gerhardt's butt cream, heparin, levalbuterol, metoprolol tartrate, midazolam, ondansetron (ZOFRAN) IV, oxyCODONE, sodium chloride, sodium chloride flush, sodium chloride flush

## 2019-11-19 NOTE — Progress Notes (Signed)
23 Days Post-Op Procedure(s) (LRB): REDO CORONARY ARTERY BYPASS GRAFTING TIMES 3  USING LEFT GREATER SAPHENOUS LEG VEIN HARVESTED ENDOSCOPICALLY(CABG) (N/A) TRANSESOPHAGEAL ECHOCARDIOGRAM (TEE) (N/A) AORTIC VALVE REPLACEMENT (AVR) USING EDWARDS INTUITY 23 MM AORTIC VALVE. (N/A) Endovein Harvest Of Greater Saphenous Vein (Left) Subjective: Intubated and sedated on vent but responds to stimulation.  Objective: Vital signs in last 24 hours: Temp:  [97.4 F (36.3 C)-99.1 F (37.3 C)] 99.1 F (37.3 C) (04/24 1522) Pulse Rate:  [59-93] 92 (04/24 1500) Cardiac Rhythm: Atrial paced (04/24 1200) Resp:  [4-27] 27 (04/24 1500) BP: (90-149)/(42-123) 149/123 (04/24 1500) SpO2:  [93 %-100 %] 100 % (04/24 1500) Arterial Line BP: (86-145)/(43-61) 95/43 (04/24 1500) FiO2 (%):  [40 %-50 %] 40 % (04/24 1424)  Hemodynamic parameters for last 24 hours: CVP:  [2 mmHg-15 mmHg] 2 mmHg  Intake/Output from previous day: 04/23 0701 - 04/24 0700 In: 1548.8 [I.V.:1328.8; NG/GT:220] Out: 2249  Intake/Output this shift: Total I/O In: 606.6 [I.V.:346.6; NG/GT:260] Out: 975 [Other:975]  General appearance: sedated on vent. Neurologic: sedated but purposeful Heart: regular rate and rhythm, S1, S2 normal, no murmur Lungs: clear to auscultation bilaterally Extremities: edema moderate Wound: incision intact.  Lab Results: Recent Labs    11/17/19 1611 11/17/19 2108 11/18/19 1101 11/18/19 1141 11/18/19 1517 11/19/19 0426  WBC 10.8*  --  18.0*  --   --   --   HGB 7.3*   < > 7.0*   < > 10.2* 8.5*  HCT 24.1*   < > 23.7*   < > 30.0* 25.0*  PLT 175  --  219  --   --   --    < > = values in this interval not displayed.   BMET:  Recent Labs    11/19/19 0342 11/19/19 0342 11/19/19 0352 11/19/19 0426  NA 136   < > 136 139  K 5.5*   < > 5.5* 5.6*  CL 104  --  104  --   CO2 24  --  23  --   GLUCOSE 222*  --  220*  --   BUN 54*  --  54*  --   CREATININE 3.72*  --  4.00*  --   CALCIUM 7.5*  --   7.6*  --    < > = values in this interval not displayed.    PT/INR:  Recent Labs    11/18/19 1101  LABPROT 28.1*  INR 2.6*   ABG    Component Value Date/Time   PHART 7.380 11/19/2019 0426   HCO3 26.0 11/19/2019 0426   TCO2 27 11/19/2019 0426   ACIDBASEDEF 4.0 (H) 11/18/2019 1517   O2SAT 99.0 11/19/2019 0426   CBG (last 3)  Recent Labs    11/18/19 2220 11/19/19 1118 11/19/19 1514  GLUCAP 243* 212* 203*   CLINICAL DATA:  Respiratory failure.  EXAM: PORTABLE CHEST 1 VIEW  COMPARISON:  November 18, 2019  FINDINGS: Stable right PICC line. Stable left central line. The ETT is in good position. The feeding tube terminates below today's film. A left-sided transcutaneous pacer is again identified. No pneumothorax. Stable cardiomediastinal silhouette. Opacity and effusion in the right base is improved. Opacity and probable associated effusion on the left is partially obscured but similar in the interval. Suspect mild pulmonary venous congestion.  IMPRESSION: 1. Support apparatus as above. 2. Probable mild pulmonary venous congestion. 3. Improving opacity and effusion in the right base. Similar opacity and effusion in the left base.   Electronically Signed  By: Dorise Bullion III M.D   On: 11/19/2019 11:57  Assessment/Plan: S/P Procedure(s) (LRB): REDO CORONARY ARTERY BYPASS GRAFTING TIMES 3  USING LEFT GREATER SAPHENOUS LEG VEIN HARVESTED ENDOSCOPICALLY(CABG) (N/A) TRANSESOPHAGEAL ECHOCARDIOGRAM (TEE) (N/A) AORTIC VALVE REPLACEMENT (AVR) USING EDWARDS INTUITY 23 MM AORTIC VALVE. (N/A) Endovein Harvest Of Greater Saphenous Vein (Left)  Remains on NE 20 and vaso 0.03 for BP support while sedated on vent. Co-ox 63% this am when only on 6 mgm NE.   VDRF s/p PEA arrest during CTA yesteray. CCM following. Resting on vent today. CXR shows moderate bilateral pleural effusions with compressive atelectasis of both lower lobes. May benefit from drainage prior to  extubation although left effusion could be loculated. CT did not show any pericardial pathology.   ARF on CRRT. Removing fluid today.  Tolerating TF for nutrition.   LOS: 26 days    Gaye Pollack 11/19/2019

## 2019-11-19 NOTE — Progress Notes (Signed)
[facility] Progress Note Patient Name: Patrick [name] DOB: 05/26/1956 MRN: [id number]   Date of Service  11/19/2019  HPI/Events of Note  Pruritus  eICU Interventions  Benadryl 12.5 mg iv Q 6 hours PRN itching ordered.        [name] [name] 11/19/2019, 5:48 AM

## 2019-11-19 NOTE — Progress Notes (Signed)
NAME:  Patrick [name], MRN:  [id number], DOB:  Jul 21, 1956, LOS: 58 ADMISSION DATE:  10/24/2019, CONSULTATION DATE:  11/17/2020 REFERRING MD:  Kirk Ruths, CHIEF COMPLAINT:  Respiratory Arrest 4/23   Brief History   64 y/o male w/ h/o CAD, HTN, HLD, DM, ESRD.CABG x 1 1997, failed kidney transplant,end-stage renal disease on dialysis admitted 3/29  w/ CP, NSTEMI . He underwent  re-do CABG x 3  on 4/1. Post operative course c/b by cardiogenic shock, respiratory failure, VT,illius, and protein malnutrition. Pt. Had respiratory /  PEA arrest in CT on 4/23 requiring intubation. PCCM have been asked to consult  for respiratory failure/ ventilation management post arrest.  History of present illness   64 y/o male w/ h/o CAD, HTN, HLD, DM, ESRD.CABG x 1 1997, failed kidney transplant,end-stage renal disease on dialysis admitted 3/29  w/ CP, NSTEMI . He underwent  re-do CABG x 3   on 4/1. Post operative course c/b by cardiogenic shock, respiratory failure, VT,illius, and protein malnutrition.  He had  respiratory and PEA arrest on 4/23 while in CT scan. CPR was started, and the patient was intubated. He received  CPR x 5 minutes, ACLS medications  included 2 amps  of epi, one amp Bicarb and 1 amp calcium, when ROSC was achieved. He was neurologically intact post arrest.He had been on BiPAP prior to arrest with pulmonary edema and suspicion of aspiration pneumonia. PCCM have been asked to consult for respiratory failure and ventilator management  Suspect components of pulmonary edema and pleural effusions, restriction from poor chest and abdominal compliance, ileus. He may also have history of undiagnosed OSA/OHS based on body habitus.Per his wife he snores and awakens multiple times through the night with gasping for air.  Past Medical History  CAD, HTN, HLD, DM, ESRD.  NSTEMI 10/25/2019 10/25/2019: LHC w/ severe CAD. Echo w/ reduced EF 35-40% and moderate AI. CABG Re-Do x 3 on 10/27/2019(SVG-LAD, SVG-PDA, SVG-OM).     RCA PCI 01/2019 Atrial Flutter s/p ablation 2018 CABG 1997 w / LIMA-LAD Combined systolic/ Diastolic Heart Failure Failed kidney transplant,end-stage renal disease on dialysis   Significant Hospital Events   3/29 Admission  4/1 CABG x 3 ( Re-do) 4/23 Respiratory / PEA  Arrest  4/23 Intubation and PCCM consult   Consults:  3/29 Renal Consult 3/30 TCTS Consult 4/23 PCCM  Procedures:  4/1 CABG x 3 (SVG-LAD, SVG-PDA, SVG-OM).  Significant Diagnostic Tests:  11/19/2019 [facility]A Cardiology  IMPRESSION: 1. Moderate to large bilateral pleural effusions are identified. The left pleural effusion appears partially loculated with loculated fluid along the paramediastinal left upper lobe. 2. There is compressive type atelectasis and consolidation associated with bilateral pleural effusions with considerable reduction in aerated lung volumes bilaterally. 3. Aortic atherosclerosis. IMPRESSION: Difficult images. There does not appear to be significant pericardial pathology.   11/18/2019 CXR Intubated, endotracheal tube tip in good position. Otherwise stable lines and tubes. Improved lung volumes since 0907 hours today.  Small bilateral pleural effusions with compressive lung base atelectasis better demonstrated on the CT images today 1045 hours.  Echo 4/14 with EF 30-35%, stable aortic valve replacement.    Echo 4/21 with EF 45-50% and significant respirophasic septal variation.  Pericardium in some places appears thickened though windows are poor.  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  Cath 10/25/2019 . Severe 3 vessel CAD    - CTO of the mid LAD after the first diagonal    - 90% ostial LCx    -  100% mid RCA in stent. Left to right collaterals. 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 Atretic LIMA graft Mildly elevated LVEDP Early restenosis/occlusion of the RCA. This vessel is diffusely diseased and heavily  calcified. The ostial LCx appears new compared to prior.  Percutaneous options are very limited Micro Data:  4/14 Blood>> No growth 4/11 Blood >> No Growth 4/8  Respiratory>> Normal Flora 4/8/ Blood >> No Growth 3/29 SARS Coronavirus 2 >> Negative Antimicrobials:  Vanc 4/11-4/17 Meropenem 4/11-4/17  Interim history/subjective:  Pt. Remains intubated and sedated. ( Fentanyl at 100 mcg) Remains on Levo at 10 mcg/min Vasopressin at 0.03 units CVVH >> Goal 1-2 L per 24 hours Net negative 4800 cc's Potassium is slowly normalizing For CTA of chest to look at pericardium 4/24 (+contrast allergy).   Objective   Blood pressure (!) 102/59, pulse 91, temperature 97.6 F (36.4 C), temperature source Axillary, resp. rate 20, height 5' 6.5" (1.689 m), weight 98.7 kg, SpO2 93 %. CVP:  [7 mmHg-13 mmHg] 11 mmHg  Vent Mode: PRVC FiO2 (%):  [40 %-100 %] 40 % Set Rate:  [22 bmp] 22 bmp Vt Set:  [510 mL] 510 mL PEEP:  [5 cmH20-8 cmH20] 5 cmH20 Plateau Pressure:  [12 cmH20-22 cmH20] 15 cmH20   Intake/Output Summary (Last 24 hours) at 11/19/2019 6237 Last data filed at 11/19/2019 0800 Gross per 24 hour  Intake 1566.84 ml  Output 2396 ml  Net -829.16 ml   Filed Weights   11/15/19 0500 11/16/19 0400 11/17/19 0402  Weight: 96.4 kg 96.6 kg 98.7 kg    Examination: General: Critically ill male supine in bed, intubated and sedated HENT: NCAT, Oral ETT, Cor Track , No LAD, thick neck Lungs: Bilateral chest excursion, Coarse throughout with rhonchi, diminished per bases, secretions are tan to blood tinged Cardiovascular: S1, S2, No S3, S4, RRR, No RMG, SR with depressed ST per tele Abdomen: Distended, NT, BS diminished, Obese, Pacer wires noted Extremities: Warm to touch, No obvious deformities, brisl refill noted Neuro: MAE x 4, Nods appropriately, follows simple commands  GU: Anuric  Resolved Hospital Problem list     Assessment & Plan:  Acute Respiratory Failure / PEA Arrest while in CT  4/23 Hx. Of Pulmonary Edema /  Suspected Aspiration  Several days prior CXR 4/23  Improved lung volumes since 0907 hours today.  Moderate to large bilateral  pleural effusions with compressive lung base atelectasis  Left pleural effusion appears partially loculated with loculated fluid along the paramediastinal left upper lobe. Suspect low lung volumes prior to arrest most likely 2/2 illeus and abdominal pain post CABG.  Additionally suspect undiagnosed OSA/OHS as contributing factor. Plan SBT 4/24 am  Wean FiO2 and PEEP for sats of > 94% in setting of CAD VAP protocol Trend CXR 4/25, and prn  Monitor  effusions for change>> may need thora/ CT  if not responsive to fluid removal ( CVVH at present) Follow Sputum Cx Trend Mag, maintain > 2 Fentanyl gtt with Versed pushes for sedation, RASS goal -1 ABG in am 4/25 and prn Consider BiPAP at HS once extubated Will need OP follow up to assess for OSA at discharge  Hyperkalemia ESRD K > 7.3 after Code  Now 5.5 on  4/24 Slowly down trending CVVH continues with goal of - 1-2 L daily Creatinine 4 Plan Trend Serum K Continue CVVHD  Appreciate renal assist BMET at 1600 per CVVHD/ Renal protocol Trend BMET daily   Hypotension post arrest Cardiogenic  shock Plan Titrate/ wean  Levophed for MAP goal of > 65 mm Hg] Continue Vasopressin per cards CVP monitoring Rest per Primary Team  Remains Afebrile WBC pending, was initially 18 but suspect reactive CXR shows compressive atelelctasis Plan Will monitor off antibiotics for now Trend WBC and fever curve Culture Sputum 4/23 Follow micro Re-culture as is clinically indicated  Anemia Plan Trend CBC Monitor for bleeding Transfuse for HGB < 8 ( Recent CABG)  Diarrhea Concern for ileus C diff Quick screen negative Plan Advance TF to goal as he has tolerated trickle overnight Send Stool for C diff   Best practice:  Diet: Advance TF to goal Pain/Anxiety/Delirium protocol (if  indicated): Fentanyl with prn Versed VAP protocol (if indicated): Initiated DVT prophylaxis: Eliquis GI prophylaxis: Protonix Glucose control: CBG with SSI Mobility: BR Code Status: Full Family Communication: Wife and daughter updated in full 4/24 Disposition: ICU  Labs   CBC: Recent Labs  Lab 11/15/19 0500 11/15/19 0500 11/16/19 0432 11/16/19 0432 11/17/19 0537 11/17/19 1517 11/17/19 1611 11/17/19 1611 11/17/19 2108 11/18/19 1101 11/18/19 1141 11/18/19 1517 11/19/19 0426  WBC 7.6  --  8.1  --  11.7*  --  10.8*  --   --  18.0*  --   --   --   HGB 8.1*   < > 7.7*   < > 7.5*   < > 7.3*   < > 7.5* 7.0* 8.2* 10.2* 8.5*  HCT 26.5*   < > 25.9*   < > 25.1*   < > 24.1*   < > 22.0* 23.7* 24.0* 30.0* 25.0*  MCV 93.6  --  95.6  --  95.8  --  94.9  --   --  98.3  --   --   --   PLT 119*  --  137*  --  162  --  175  --   --  219  --   --   --    < > = values in this interval not displayed.    Basic Metabolic Panel: Recent Labs  Lab 11/14/19 0428 11/14/19 1223 11/15/19 0500 11/15/19 0500 11/15/19 1519 11/15/19 1519 11/16/19 0432 11/17/19 0537 11/17/19 1611 11/17/19 2108 11/18/19 1101 11/18/19 1141 11/18/19 1517 11/18/19 1517 11/18/19 1600 11/18/19 2028 11/18/19 2218 11/18/19 2353 11/19/19 0342 11/19/19 0352 11/19/19 0426  NA 133*   < > 132*   < > 134*   < > 134*   < > 134*   < > 138   < > 134*  --  133*  --   --   --  136 136 139  K 4.5   < > 4.3   < > 4.3   < > 4.5   < > 5.6*   < > >7.5*   < > 6.9*   < > 7.1*   < > 6.3* 6.2* 5.5* 5.5* 5.6*  CL 99   < > 101   < > 103   < > 103   < > 104  --  99  --   --   --  103  --   --   --  104 104  --   CO2 25   < > 23   < > 26   < > 23   < > 21*  --  28  --   --   --  20*  --   --   --  24 23  --   GLUCOSE 154*   < >  180*   < > 173*   < > 184*   < > 154*  --  403*  --   --   --  364*  --   --   --  222* 220*  --   BUN 18   < > 20   < > 18   < > 24*   < > 63*  --  85*  --   --   --  80*  --   --   --  54* 54*  --   CREATININE  1.97*   < > 2.07*   < > 2.05*   < > 2.14*   < > 4.89*  --  6.74*  --   --   --  6.27*  --   --   --  3.72* 4.00*  --   CALCIUM 7.2*   < > 6.9*   < > 7.0*   < > 7.0*   < > 6.3*  --  6.4*  --   --   --  7.9*  --   --   --  7.5* 7.6*  --   MG 2.4  --  2.3  --   --   --  2.3  --   --   --   --   --   --   --  2.8*  --   --   --  2.6*  --   --   PHOS 2.1*   < > 1.5*  --  1.9*  --  1.7*  --   --   --   --   --   --   --  4.5  --   --   --   --  3.6  --    < > = values in this interval not displayed.   GFR: Estimated Creatinine Clearance: 20.4 mL/min (A) (by C-G formula based on SCr of 4 mg/dL (H)). Recent Labs  Lab 11/16/19 0432 11/17/19 0537 11/17/19 1611 11/18/19 1101 11/18/19 1323 11/18/19 1611 11/19/19 0342  WBC 8.1 11.7* 10.8* 18.0*  --   --   --   LATICACIDVEN  --   --   --   --  2.0* 1.7 1.1    Liver Function Tests: Recent Labs  Lab 11/16/19 0432 11/18/19 1101 11/18/19 1600 11/19/19 0342 11/19/19 0352  AST  --  113*  --  86*  --   ALT  --  104*  --  101*  --   ALKPHOS  --  190*  --  198*  --   BILITOT  --  1.3*  --  1.6*  --   PROT  --  7.5  --  8.7*  --   ALBUMIN 1.7* 1.5* 1.8* 1.7* 1.7*   Recent Labs  Lab 11/18/19 1101  LIPASE 83*   No results for input(s): AMMONIA in the last 168 hours.  ABG    Component Value Date/Time   PHART 7.380 11/19/2019 0426   PCO2ART 43.8 11/19/2019 0426   PO2ART 124 (H) 11/19/2019 0426   HCO3 26.0 11/19/2019 0426   TCO2 27 11/19/2019 0426   ACIDBASEDEF 4.0 (H) 11/18/2019 1517   O2SAT 99.0 11/19/2019 0426     Coagulation Profile: Recent Labs  Lab 11/18/19 1101  INR 2.6*    Cardiac Enzymes: No results for input(s): CKTOTAL, CKMB, CKMBINDEX, TROPONINI in the last 168 hours.  HbA1C: Hemoglobin A1C  Date/Time Value Ref Range Status  06/07/2019 10:54  AM 6.4 (A) 4.0 - 5.6 % Final   Hgb A1c MFr Bld  Date/Time Value Ref Range Status  10/27/2019 04:51 AM 6.2 (H) 4.8 - 5.6 % Final    Comment:    (NOTE) Pre diabetes:           5.7%-6.4% Diabetes:              >6.4% Glycemic control for   <7.0% adults with diabetes   08/20/2018 07:23 AM 6.9 (H) 4.8 - 5.6 % Final    Comment:    (NOTE) Pre diabetes:          5.7%-6.4% Diabetes:              >6.4% Glycemic control for   <7.0% adults with diabetes     CBG: Recent Labs  Lab 11/17/19 1524 11/17/19 2114 11/17/19 2116 11/18/19 1802 11/18/19 2220  GLUCAP 131* 158* 160* 305* 243*    Review of Systems:   Unable as patient is intubated and sedated  Past Medical History  He,  has a past medical history of Arthritis, Atrial flutter ([place]), Coronary artery disease, Erectile dysfunction, ESRD (end stage renal disease) on dialysis ([place]), Essential hypertension, Gout, Hernia of abdominal wall, History of blood transfusion, History of cardiomyopathy, History of kidney stones, History of pneumonia, Migraine, Myocardial infarction ([place]), Pneumonia, Secondary hyperparathyroidism ([place]), Type 2 diabetes mellitus ([place]), and Wears glasses.   Surgical History    Past Surgical History:  Procedure Laterality Date  . A-FLUTTER ABLATION N/A 09/24/2016   Procedure: A-Flutter Ablation;  Surgeon: Will Meredith Leeds, MD;  Location: [facility];  Service: Cardiovascular;  Laterality: N/A;  . ABDOMINAL AORTOGRAM W/LOWER EXTREMITY N/A 04/13/2017   Procedure: ABDOMINAL AORTOGRAM W/LOWER EXTREMITY;  Surgeon: Conrad [place]view, MD;  Location: [facility];  Service: Cardiovascular;  Laterality: N/A;  Bilater lower extermity  . AORTIC VALVE REPLACEMENT N/A 10/27/2019   Procedure: AORTIC VALVE REPLACEMENT (AVR) USING EDWARDS INTUITY 23 MM AORTIC VALVE.;  Surgeon: Wonda Olds, MD;  Location: [facility];  Service: Open Heart Surgery;  Laterality: N/A;  . APPENDECTOMY    . AV FISTULA PLACEMENT Right 09/18/2014   Procedure: INSERTION OF ARTERIOVENOUS (AV) GORE-TEX GRAFT ARM USING 4-7MM  X 45CM STRETCH GORE-TEX VASCULAR GRAFT;  Surgeon: Rosetta Posner, MD;  Location: [facility];  Service:  Vascular;  Laterality: Right;  . AV FISTULA PLACEMENT Left 07/07/2016   Procedure: INSERTION OF LEFT BRACHIAL TO AXILLARY ARTERIOVENOUS (AV) GORE-TEX ARM GRAFT;  Surgeon: Conrad Caddo, MD;  Location: [facility];  Service: Vascular;  Laterality: Left;  . AV FISTULA PLACEMENT Left 12/09/2018   Procedure: Insertion Of Arteriovenous (Av) Gore-Tex Graft Arm, left arm;  Surgeon: Marty Heck, MD;  Location: [facility];  Service: Vascular;  Laterality: Left;  . CARDIAC CATHETERIZATION  ~ 2016  . COLONOSCOPY    . 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 CORONARY ARTERY BYPASS GRAFT N/A 10/27/2019   Procedure: REDO CORONARY ARTERY BYPASS GRAFTING TIMES 3  USING LEFT GREATER SAPHENOUS LEG VEIN HARVESTED ENDOSCOPICALLY(CABG);  Surgeon: Wonda Olds, MD;  Location: [facility];  Service: Open Heart Surgery;  Laterality: N/A;  . CORONARY ATHERECTOMY N/A 02/16/2019   Procedure: CORONARY ATHERECTOMY;  Surgeon: Martinique, Peter M, MD;  Location: [facility];  Service: Cardiovascular;  Laterality: N/A;  . ENDOVEIN HARVEST OF GREATER SAPHENOUS VEIN Left 10/27/2019   Procedure: [place]  Of Greater Saphenous Vein;  Surgeon: Wonda Olds, MD;  Location: [facility];  Service: Open Heart Surgery;  Laterality: Left;  . EXCHANGE OF A DIALYSIS CATHETER Left 01/11/2018   Procedure: EXCHANGE OF TUNNELED DIALYSIS CATHETER;  Surgeon: Rosetta Posner, MD;  Location: [facility];  Service: Vascular;  Laterality: Left;  . HERNIA REPAIR  2017   with nephrectomy  . INSERTION OF DIALYSIS CATHETER N/A 10/08/2017   Procedure: INSERTION OF TUNNELED DIALYSIS CATHETER;  Surgeon: Conrad Morning Glory, MD;  Location: [facility];  Service: Vascular;  Laterality: N/A;  . IR FLUORO GUIDE CV LINE LEFT  03/12/2018  . IR FLUORO GUIDE CV LINE RIGHT  10/31/2019  . IR GENERIC HISTORICAL  05/11/2016   IR FLUORO GUIDE CV LINE LEFT 05/11/2016 Corrie Mckusick, DO [facility]  . IR GENERIC  HISTORICAL  05/11/2016   IR US GUIDE VASC ACCESS LEFT 05/11/2016 Corrie Mckusick, DO [facility]  . IR GENERIC HISTORICAL  05/11/2016   IR US GUIDE VASC ACCESS RIGHT 05/11/2016 Corrie Mckusick, DO [facility]  . IR GENERIC HISTORICAL  05/11/2016   IR RADIOLOGY PERIPHERAL GUIDED IV START 05/11/2016 Corrie Mckusick, DO [facility]  . IR PTA VENOUS EXCEPT DIALYSIS CIRCUIT  10/31/2019  . IR US GUIDE VASC ACCESS RIGHT  10/31/2019  . KIDNEY TRANSPLANT  2009  . LEFT HEART CATH AND CORONARY ANGIOGRAPHY N/A 02/15/2019   Procedure: LEFT HEART CATH AND CORONARY ANGIOGRAPHY;  Surgeon: Martinique, Peter M, MD;  Location: [facility];  Service: Cardiovascular;  Laterality: N/A;  . LEFT HEART CATH AND CORS/GRAFTS ANGIOGRAPHY N/A 12/04/2017   Procedure: LEFT HEART CATH AND CORS/GRAFTS ANGIOGRAPHY;  Surgeon: Belva Crome, MD;  Location: [facility];  Service: Cardiovascular;  Laterality: N/A;  . LEFT HEART CATH AND CORS/GRAFTS ANGIOGRAPHY N/A 10/25/2019   Procedure: LEFT HEART CATH AND CORS/GRAFTS ANGIOGRAPHY;  Surgeon: Martinique, Peter M, MD;  Location: [facility];  Service: Cardiovascular;  Laterality: N/A;  . NEPHRECTOMY  2017   transplant rejected   . PERIPHERAL VASCULAR CATHETERIZATION N/A 06/04/2016   Procedure: Upper Extremity Venography;  Surgeon: Waynetta Sandy, MD;  Location: [facility];  Service: Cardiovascular;  Laterality: N/A;  . PERIPHERAL VASCULAR INTERVENTION  04/13/2017   Procedure: PERIPHERAL VASCULAR INTERVENTION;  Surgeon: Conrad Pettibone, MD;  Location: [facility];  Service: Cardiovascular;;  Lt. Common/Exernal  Iliac  . TEE WITHOUT CARDIOVERSION N/A 10/27/2019   Procedure: TRANSESOPHAGEAL ECHOCARDIOGRAM (TEE);  Surgeon: Wonda Olds, MD;  Location: [facility];  Service: Open Heart Surgery;  Laterality: N/A;  . THROMBECTOMY AND REVISION OF ARTERIOVENTOUS (AV) GORETEX  GRAFT Left 10/08/2017   Procedure: THROMBECTOMY of ARTERIOVENTOUS (AV) GORETEX  GRAFT LEFT  UPPER ARM;  Surgeon: Conrad [facility], MD;  Location: [facility];  Service: Vascular;  Laterality: Left;  . THROMBECTOMY W/ EMBOLECTOMY Left 09/14/2017   Procedure: THROMBECTOMY ARTERIOVENOUS GORE-TEX GRAFT LEFT UPPER ARM;  Surgeon: Rosetta Posner, MD;  Location: [facility];  Service: Vascular;  Laterality: Left;  . UPPER EXTREMITY ANGIOGRAPHY Bilateral 10/29/2018   Procedure: UPPER EXTREMITY ANGIOGRAPHY;  Surgeon: Marty Heck, MD;  Location: [facility];  Service: Cardiovascular;  Laterality: Bilateral;  . UPPER EXTREMITY VENOGRAPHY N/A 11/16/2017   Procedure: UPPER EXTREMITY VENOGRAPHY - Right Arm;  Surgeon: Conrad Primera, MD;  Location: [facility];  Service: Cardiovascular;  Laterality: N/A;  . UPPER EXTREMITY VENOGRAPHY Bilateral 10/29/2018   Procedure: UPPER EXTREMITY VENOGRAPHY;  Surgeon:  Marty Heck, MD;  Location: [facility];  Service: Cardiovascular;  Laterality: Bilateral;     Social History   reports that he quit smoking about 5 years ago. His smoking use included cigarettes. He has never used smokeless tobacco. He reports current alcohol use. He reports that he does not use drugs.   Family History   His family history includes HIV in his sister; Hyperlipidemia in his mother; Hypertension in his mother.   Allergies Allergies  Allergen Reactions  . Baclofen Other (See Comments)    Possibly stroke like symptoms  . Iodinated Diagnostic Agents Swelling, Rash and Other (See Comments)    Other Reaction: burning to mouth, swelling of lips  . Lipitor [Atorvastatin] Other (See Comments)    Leg pain  . Metoprolol Other (See Comments)    Headaches      Home Medications  Prior to Admission medications   Medication Sig Start Date End Date Taking? Authorizing Provider  acetaminophen (TYLENOL) 500 MG tablet Take 1,000 mg by mouth every 6 (six) hours as needed for moderate pain or headache.   Yes [provider]  albuterol (PROVENTIL HFA;VENTOLIN HFA) 108  (90 Base) MCG/ACT inhaler Inhale 1-2 puffs into the lungs every 6 (six) hours as needed for wheezing or shortness of breath. 12/18/17  Yes Richardson Dopp T, PA-C  aspirin EC 81 MG tablet Take 81 mg by mouth daily.    Yes [provider]  calcium acetate (PHOSLO) 667 MG capsule Take 1,334-2,001 mg by mouth See admin instructions. 3 capsules with dinner and 2 capsules with snacks   Yes [provider]  calcium carbonate (OS-CAL - DOSED IN MG OF ELEMENTAL CALCIUM) 1250 (500 Ca) MG tablet Take 2 tablets by mouth daily.  11/29/13  Yes [provider]  clopidogrel (PLAVIX) 75 MG tablet TAKE 1 TABLET(75 MG) BY MOUTH DAILY Patient taking differently: Take 75 mg by mouth daily.  03/03/19  Yes Richardson Dopp T, PA-C  Colchicine 0.6 MG CAPS Take 0.6 mg by mouth See admin instructions. Take 0.6 mg when first symptoms of gout flare occur, then take another 0.6 mg 14 days later as needed for gout 10/13/18  Yes [provider]  dextromethorphan (DELSYM) 30 MG/5ML liquid Take 30 mg by mouth as needed for cough.   Yes [provider]  Dextromethorphan-guaiFENesin 5-100 MG/5ML LIQD Take 15 mLs by mouth 2 (two) times daily as needed (Cough).  06/03/18  Yes [provider]  docusate sodium (COLACE) 100 MG capsule Take 100 mg by mouth daily as needed for mild constipation.   Yes [provider]  ezetimibe (ZETIA) 10 MG tablet TAKE 1 TABLET(10 MG) BY MOUTH DAILY Patient taking differently: Take 10 mg by mouth daily.  05/09/19  Yes Weaver, [facility] T, PA-C  isosorbide mononitrate (IMDUR) 30 MG 24 hr tablet Take 1 tablet (30 mg total) by mouth daily. 02/24/19  Yes Daune Perch, NP  lidocaine-prilocaine (EMLA) cream Apply 1 application topically daily as needed (Numbing).  03/21/19  Yes [provider]  metoprolol succinate (TOPROL XL) 25 MG 24 hr tablet Take 1 tablet (25 mg total) by mouth daily. 05/05/19  Yes Daune Perch, NP  multivitamin (RENA-VIT) TABS tablet  Take 1 tablet by mouth daily.    Yes [provider]  neomycin-bacitracin-polymyxin (NEOSPORIN) ointment Apply 1 application topically 2 (two) times daily. Apply to feet.   Yes [provider]  nitroGLYCERIN (NITROSTAT) 0.4 MG SL tablet Place 1 tablet (0.4 mg total) under  the tongue every 5 (five) minutes as needed for chest pain. 09/16/19 12/15/19 Yes Fay Records, MD  rosuvastatin (CRESTOR) 10 MG tablet Take 1 tablet (10 mg total) by mouth daily. 02/24/19  Yes Daune Perch, NP  silver sulfADIAZINE (SILVADENE) 1 % cream Apply 1 application topically daily. Patient taking differently: Apply 1 application topically 2 (two) times daily.  10/18/19  Yes Trula Slade, DPM  traMADol (ULTRAM) 50 MG tablet Take 50 mg by mouth 2 (two) times daily as needed for moderate pain.  05/19/18  Yes [provider]  doxycycline (VIBRA-TABS) 100 MG tablet Take 1 tablet (100 mg total) by mouth 2 (two) times daily. Patient not taking: Reported on 10/24/2019 09/06/19   Trula Slade, DPM  Methoxy PEG-Epoetin Beta (MIRCERA IJ) Mircera 09/05/19 09/03/20  [provider]  oxyCODONE-acetaminophen (PERCOCET/ROXICET) 5-325 MG tablet Take 1 tablet by mouth every 6 (six) hours as needed. Patient not taking: Reported on 10/24/2019 12/09/18   Ulyses Amor, PA-C  pantoprazole (PROTONIX) 40 MG tablet Take 1 tablet (40 mg total) by mouth daily. 11/17/19   Fay Records, MD  repaglinide (PRANDIN) 0.5 MG tablet Take 1 tablet (0.5 mg total) by mouth 2 (two) times daily before a meal. OVERDUE FOR AN APPT. WILL PROVIDE 30 DAY SUPPLY ONLY. MUST CALL OFFICE 11/03/19   Renato Shin, MD     Critical care APP time: 70 minutes    Magdalen Spatz, MSN, AGACNP-BC [place] Pager # [phone] After 4 pm please call [phone] 11/19/2019 9:39 AM

## 2019-11-20 ENCOUNTER — Inpatient Hospital Stay (HOSPITAL_COMMUNITY): Payer: Medicare HMO

## 2019-11-20 DIAGNOSIS — J9601 Acute respiratory failure with hypoxia: Secondary | ICD-10-CM | POA: Diagnosis not present

## 2019-11-20 DIAGNOSIS — I214 Non-ST elevation (NSTEMI) myocardial infarction: Secondary | ICD-10-CM | POA: Diagnosis not present

## 2019-11-20 DIAGNOSIS — J810 Acute pulmonary edema: Secondary | ICD-10-CM | POA: Diagnosis not present

## 2019-11-20 DIAGNOSIS — I5020 Unspecified systolic (congestive) heart failure: Secondary | ICD-10-CM

## 2019-11-20 DIAGNOSIS — Z9911 Dependence on respirator [ventilator] status: Secondary | ICD-10-CM | POA: Diagnosis not present

## 2019-11-20 DIAGNOSIS — I469 Cardiac arrest, cause unspecified: Secondary | ICD-10-CM | POA: Diagnosis not present

## 2019-11-20 DIAGNOSIS — R570 Cardiogenic shock: Secondary | ICD-10-CM | POA: Diagnosis not present

## 2019-11-20 LAB — HEPATIC FUNCTION PANEL
ALT: 189 U/L — ABNORMAL HIGH (ref 0–44)
AST: 251 U/L — ABNORMAL HIGH (ref 15–41)
Albumin: 1.7 g/dL — ABNORMAL LOW (ref 3.5–5.0)
Alkaline Phosphatase: 264 U/L — ABNORMAL HIGH (ref 38–126)
Bilirubin, Direct: 0.9 mg/dL — ABNORMAL HIGH (ref 0.0–0.2)
Indirect Bilirubin: 0.5 mg/dL (ref 0.3–0.9)
Total Bilirubin: 1.4 mg/dL — ABNORMAL HIGH (ref 0.3–1.2)
Total Protein: 8.5 g/dL — ABNORMAL HIGH (ref 6.5–8.1)

## 2019-11-20 LAB — RENAL FUNCTION PANEL
Albumin: 1.6 g/dL — ABNORMAL LOW (ref 3.5–5.0)
Albumin: 1.7 g/dL — ABNORMAL LOW (ref 3.5–5.0)
Anion gap: 6 (ref 5–15)
Anion gap: 6 (ref 5–15)
BUN: 36 mg/dL — ABNORMAL HIGH (ref 8–23)
BUN: 33 mg/dL — ABNORMAL HIGH (ref 8–23)
CO2: 26 mmol/L (ref 22–32)
CO2: 27 mmol/L (ref 22–32)
Calcium: 7.2 mg/dL — ABNORMAL LOW (ref 8.9–10.3)
Calcium: 7.2 mg/dL — ABNORMAL LOW (ref 8.9–10.3)
Chloride: 102 mmol/L (ref 98–111)
Chloride: 101 mmol/L (ref 98–111)
Creatinine, Ser: 2.31 mg/dL — ABNORMAL HIGH (ref 0.61–1.24)
Creatinine, Ser: 2 mg/dL — ABNORMAL HIGH (ref 0.61–1.24)
GFR calc Af Amer: 33 mL/min — ABNORMAL LOW (ref >60)
GFR calc Af Amer: 39 mL/min — ABNORMAL LOW (ref >60)
GFR calc non Af Amer: 29 mL/min — ABNORMAL LOW (ref >60)
GFR calc non Af Amer: 34 mL/min — ABNORMAL LOW (ref >60)
Glucose, Bld: 251 mg/dL — ABNORMAL HIGH (ref 70–99)
Glucose, Bld: 199 mg/dL — ABNORMAL HIGH (ref 70–99)
Phosphorus: 2.4 mg/dL — ABNORMAL LOW (ref 2.5–4.6)
Phosphorus: 2.4 mg/dL — ABNORMAL LOW (ref 2.5–4.6)
Potassium: 4.8 mmol/L (ref 3.5–5.1)
Potassium: 5 mmol/L (ref 3.5–5.1)
Sodium: 134 mmol/L — ABNORMAL LOW (ref 135–145)
Sodium: 134 mmol/L — ABNORMAL LOW (ref 135–145)

## 2019-11-20 LAB — CBC
HCT: 26.9 % — ABNORMAL LOW (ref 39.0–52.0)
Hemoglobin: 8.1 g/dL — ABNORMAL LOW (ref 13.0–17.0)
MCH: 28.9 pg (ref 26.0–34.0)
MCHC: 30.1 g/dL (ref 30.0–36.0)
MCV: 96.1 fL (ref 80.0–100.0)
Platelets: 213 10*3/uL (ref 150–400)
RBC: 2.8 MIL/uL — ABNORMAL LOW (ref 4.22–5.81)
RDW: 20.2 % — ABNORMAL HIGH (ref 11.5–15.5)
WBC: 13.4 10*3/uL — ABNORMAL HIGH (ref 4.0–10.5)
nRBC: 1.3 % — ABNORMAL HIGH (ref 0.0–0.2)

## 2019-11-20 LAB — POCT I-STAT 7, (LYTES, BLD GAS, ICA,H+H)
Acid-base deficit: 2 mmol/L (ref 0.0–2.0)
Bicarbonate: 23.4 mmol/L (ref 20.0–28.0)
Calcium, Ion: 1.01 mmol/L — ABNORMAL LOW (ref 1.15–1.40)
HCT: 25 % — ABNORMAL LOW (ref 39.0–52.0)
Hemoglobin: 8.5 g/dL — ABNORMAL LOW (ref 13.0–17.0)
O2 Saturation: 98 %
Patient temperature: 97.9
Potassium: 4.5 mmol/L (ref 3.5–5.1)
Sodium: 139 mmol/L (ref 135–145)
TCO2: 25 mmol/L (ref 22–32)
pCO2 arterial: 39.8 mmHg (ref 32.0–48.0)
pH, Arterial: 7.375 (ref 7.350–7.450)
pO2, Arterial: 103 mmHg (ref 83.0–108.0)

## 2019-11-20 LAB — GLUCOSE, CAPILLARY
Glucose-Capillary: 187 mg/dL — ABNORMAL HIGH (ref 70–99)
Glucose-Capillary: 184 mg/dL — ABNORMAL HIGH (ref 70–99)
Glucose-Capillary: 167 mg/dL — ABNORMAL HIGH (ref 70–99)
Glucose-Capillary: 165 mg/dL — ABNORMAL HIGH (ref 70–99)

## 2019-11-20 LAB — BRAIN NATRIURETIC PEPTIDE: B Natriuretic Peptide: 374.6 pg/mL — ABNORMAL HIGH (ref 0.0–100.0)

## 2019-11-20 LAB — COOXEMETRY PANEL
Carboxyhemoglobin: 1.6 % — ABNORMAL HIGH (ref 0.5–1.5)
Methemoglobin: 1.4 % (ref 0.0–1.5)
O2 Saturation: 50.1 %
Total hemoglobin: 8.6 g/dL — ABNORMAL LOW (ref 12.0–16.0)

## 2019-11-20 LAB — CULTURE, RESPIRATORY W GRAM STAIN
Culture: NORMAL
Special Requests: NORMAL

## 2019-11-20 LAB — HEPATITIS B SURFACE ANTIGEN: Hepatitis B Surface Ag: NONREACTIVE

## 2019-11-20 LAB — MAGNESIUM: Magnesium: 2.4 mg/dL (ref 1.7–2.4)

## 2019-11-20 LAB — HEPATITIS B CORE ANTIBODY, TOTAL: Hep B Core Total Ab: NONREACTIVE

## 2019-11-20 LAB — GAMMA GT: GGT: 48 U/L (ref 7–50)

## 2019-11-20 LAB — CK: Total CK: 795 U/L — ABNORMAL HIGH (ref 49–397)

## 2019-11-20 LAB — HEPATITIS C ANTIBODY: HCV Ab: NONREACTIVE

## 2019-11-20 LAB — APTT: aPTT: 44 seconds — ABNORMAL HIGH (ref 24–36)

## 2019-11-20 MED ORDER — LIP MEDEX EX OINT
TOPICAL_OINTMENT | CUTANEOUS | Status: DC | PRN
  Filled 2019-11-20: qty 7

## 2019-11-20 MED ORDER — CHLORHEXIDINE GLUCONATE CLOTH 2 % EX PADS
6.0000 | MEDICATED_PAD | Freq: Every day | CUTANEOUS | Status: DC
  Administered 2019-11-20 – 2019-11-24 (×5): 6 via TOPICAL

## 2019-11-20 MED ORDER — HEPARIN SODIUM (PORCINE) 1000 UNIT/ML DIALYSIS: 1500.0000 [IU] | INTRAMUSCULAR | Status: DC | PRN

## 2019-11-20 MED ORDER — CHLORHEXIDINE GLUCONATE 0.12% ORAL RINSE (MEDLINE KIT)
15.0000 mL | Freq: Two times a day (BID) | OROMUCOSAL | Status: DC
  Administered 2019-11-20 – 2019-11-27 (×12): 15 mL via OROMUCOSAL

## 2019-11-20 MED ORDER — INSULIN ASPART 100 UNIT/ML ~~LOC~~ SOLN
0.0000 [IU] | SUBCUTANEOUS | Status: DC
  Administered 2019-11-20 – 2019-11-21 (×3): 2 [IU] via SUBCUTANEOUS
  Administered 2019-11-21 (×2): 3 [IU] via SUBCUTANEOUS
  Administered 2019-11-22: 1 [IU] via SUBCUTANEOUS
  Administered 2019-11-22 (×2): 2 [IU] via SUBCUTANEOUS
  Administered 2019-11-22: 3 [IU] via SUBCUTANEOUS
  Administered 2019-11-22: 2 [IU] via SUBCUTANEOUS
  Administered 2019-11-23: 3 [IU] via SUBCUTANEOUS
  Administered 2019-11-23: 04:00:00 1 [IU] via SUBCUTANEOUS
  Administered 2019-11-23: 7 [IU] via SUBCUTANEOUS
  Administered 2019-11-23 – 2019-11-24 (×3): 2 [IU] via SUBCUTANEOUS
  Administered 2019-11-24 – 2019-11-25 (×6): 1 [IU] via SUBCUTANEOUS
  Administered 2019-11-25 – 2019-11-26 (×3): 2 [IU] via SUBCUTANEOUS
  Administered 2019-11-26 (×3): 1 [IU] via SUBCUTANEOUS
  Administered 2019-11-27: 2 [IU] via SUBCUTANEOUS
  Administered 2019-11-27 (×5): 1 [IU] via SUBCUTANEOUS
  Administered 2019-11-28: 15:00:00 2 [IU] via SUBCUTANEOUS
  Administered 2019-11-29: 21:00:00 3 [IU] via SUBCUTANEOUS
  Administered 2019-11-29 – 2019-11-30 (×3): 2 [IU] via SUBCUTANEOUS
  Administered 2019-11-30: 3 [IU] via SUBCUTANEOUS
  Administered 2019-11-30 (×2): 1 [IU] via SUBCUTANEOUS
  Administered 2019-11-30 – 2019-12-01 (×3): 3 [IU] via SUBCUTANEOUS
  Administered 2019-12-01: 2 [IU] via SUBCUTANEOUS
  Administered 2019-12-01: 1 [IU] via SUBCUTANEOUS
  Administered 2019-12-01: 2 [IU] via SUBCUTANEOUS
  Administered 2019-12-01 – 2019-12-02 (×2): 1 [IU] via SUBCUTANEOUS
  Administered 2019-12-02: 2 [IU] via SUBCUTANEOUS
  Administered 2019-12-03 (×2): 1 [IU] via SUBCUTANEOUS
  Administered 2019-12-03: 2 [IU] via SUBCUTANEOUS
  Administered 2019-12-03 – 2019-12-04 (×5): 1 [IU] via SUBCUTANEOUS
  Administered 2019-12-04: 12:00:00 2 [IU] via SUBCUTANEOUS
  Administered 2019-12-05 (×4): 1 [IU] via SUBCUTANEOUS
  Administered 2019-12-06: 01:00:00 2 [IU] via SUBCUTANEOUS
  Administered 2019-12-06 – 2019-12-10 (×6): 1 [IU] via SUBCUTANEOUS
  Administered 2019-12-11: 2 [IU] via SUBCUTANEOUS

## 2019-11-20 MED ORDER — ORAL CARE MOUTH RINSE
15.0000 mL | OROMUCOSAL | Status: DC
  Administered 2019-11-20 – 2019-11-25 (×36): 15 mL via OROMUCOSAL

## 2019-11-20 MED ORDER — POTASSIUM & SODIUM PHOSPHATES 280-160-250 MG PO PACK
2.0000 | PACK | Freq: Once | ORAL | Status: AC
  Administered 2019-11-20: 2
  Filled 2019-11-20: qty 2

## 2019-11-20 NOTE — Progress Notes (Addendum)
NAME:  Patrick [name], MRN:  [id number], DOB:  1956-03-28, LOS: 73 ADMISSION DATE:  10/24/2019, CONSULTATION DATE:  11/17/2020 REFERRING MD:  Kirk Ruths, CHIEF COMPLAINT:  Respiratory Arrest 4/23   Brief History   64 y/o male w/ h/o CAD, HTN, HLD, DM, ESRD.CABG x 1 1997, failed kidney transplant,end-stage renal disease on dialysis admitted 3/29  w/ CP, NSTEMI . He underwent  re-do CABG x 3  on 4/1. Post operative course c/b by cardiogenic shock, respiratory failure, VT,illius, and protein malnutrition. Pt. Had respiratory /  PEA arrest in CT on 4/23 requiring intubation. PCCM have been asked to consult  for respiratory failure/ ventilation management post arrest.  History of present illness   64 y/o male w/ h/o CAD, HTN, HLD, DM, ESRD.CABG x 1 1997, failed kidney transplant,end-stage renal disease on dialysis admitted 3/29  w/ CP, NSTEMI . He underwent  re-do CABG x 3   on 4/1. Post operative course c/b by cardiogenic shock, respiratory failure, VT,illius, and protein malnutrition.  He had  respiratory and PEA arrest on 4/23 while in CT scan. CPR was started, and the patient was intubated. He received  CPR x 5 minutes, ACLS medications  included 2 amps  of epi, one amp Bicarb and 1 amp calcium, when ROSC was achieved. He was neurologically intact post arrest.He had been on BiPAP prior to arrest with pulmonary edema and suspicion of aspiration pneumonia. PCCM have been asked to consult for respiratory failure and ventilator management  Suspect components of pulmonary edema and pleural effusions, restriction from poor chest and abdominal compliance, ileus. He may also have history of undiagnosed OSA/OHS based on body habitus.Per his wife he snores and awakens multiple times through the night with gasping for air.  Past Medical History  CAD, HTN, HLD, DM, ESRD.  NSTEMI 10/25/2019 10/25/2019: LHC w/ severe CAD. Echo w/ reduced EF 35-40% and moderate AI. CABG Re-Do x 3 on 10/27/2019(SVG-LAD, SVG-PDA, SVG-OM).     RCA PCI 01/2019 Atrial Flutter s/p ablation 2018 CABG 1997 w / LIMA-LAD Combined systolic/ Diastolic Heart Failure Failed kidney transplant,end-stage renal disease on dialysis   Significant Hospital Events   3/29 Admission 4/1 CABG x 3 ( Re-do) 4/23 Respiratory / PEA  Arrest  4/23 Intubation and PCCM consult  Consults:  3/29 Renal Consult 3/30 TCTS Consult 4/23 PCCM  Procedures:  4/1 CABG x 3 (SVG-LAD, SVG-PDA, SVG-OM).  Significant Diagnostic Tests:  11/19/2019 [facility] Cardiology  IMPRESSION: 1. Moderate to large bilateral pleural effusions are identified. The left pleural effusion appears partially loculated with loculated fluid along the paramediastinal left upper lobe. 2. There is compressive type atelectasis and consolidation associated with bilateral pleural effusions with considerable reduction in aerated lung volumes bilaterally. 3. Aortic atherosclerosis. IMPRESSION: Difficult images. There does not appear to be significant pericardial pathology.   11/18/2019 CXR Intubated, endotracheal tube tip in good position. Otherwise stable lines and tubes. Improved lung volumes since 0907 hours today.  Small bilateral pleural effusions with compressive lung base atelectasis better demonstrated on the CT images today 1045 hours.  Echo 4/14 with EF 30-35%, stable aortic valve replacement.    Echo 4/21 with EF 45-50% and significant respirophasic septal variation.  Pericardium in some places appears thickened though windows are poor.  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  Cath 10/25/2019 . Severe 3 vessel CAD    - CTO of the mid LAD after the first diagonal    - 90% ostial LCx    - 100% mid  RCA in stent. Left to right collaterals. 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 Atretic LIMA graft Mildly elevated LVEDP Early restenosis/occlusion of the RCA. This vessel is diffusely diseased and heavily  calcified. The ostial LCx appears new compared to prior.  Percutaneous options are very limited Micro Data:  4/14 Blood>> No growth 4/11 Blood >> No Growth 4/8  Respiratory>> Normal Flora 4/8/ Blood >> No Growth 3/29 SARS Coronavirus 2 >> Negative Antimicrobials:  Vanc 4/11-4/17 Meropenem 4/11-4/17  Interim history/subjective:  Patient remains intubated. On Fentanyl gtt @ 75 but awake. Tolerate pressure support yesterday x 2 hours but RR decreased after receiving low dose IV benadryl for itching. Overnight, Hgb 6.9 and he received 1 unit of pRBC. On Levo at 8 and vaso.   Objective   Blood pressure (!) 111/53, pulse 92, temperature 98.2 F (36.8 C), temperature source Oral, resp. rate 15, height 5' 6.5" (1.689 m), weight 93.6 kg, SpO2 99 %. CVP:  [6 mmHg-12 mmHg] 12 mmHg  Vent Mode: CPAP;PSV FiO2 (%):  [40 %] 40 % Set Rate:  [22 bmp] 22 bmp Vt Set:  [510 mL] 510 mL PEEP:  [5 cmH20] 5 cmH20 Pressure Support:  [8 cmH20-10 cmH20] 8 cmH20 Plateau Pressure:  [17 cmH20-28 cmH20] 17 cmH20   Intake/Output Summary (Last 24 hours) at 11/20/2019 0816 Last data filed at 11/20/2019 0800 Gross per 24 hour  Intake 2559.22 ml  Output 3527 ml  Net -967.78 ml   Filed Weights   11/16/19 0400 11/17/19 0402 11/20/19 0500  Weight: 96.6 kg 98.7 kg 93.6 kg    Examination: General: awake and following commands, remains intubated  HENT: NCAT, ETT in place, core track  Lungs:coarse breath sounds throughout,some ronchi, diminished at L base  Cardiovascular: RRR, no mrg  Abdomen: obese, NT, hypoactive bowel sounds, pacer wires over abdomen  Extremities: warm, no pitting edema Neuro:easily arousable to voice, follows commands and answers questions appropriately, moving all 4 extremities   GU: Anuric Skin: several digital ulcers in bilateral feet   Resolved Hospital Problem list     Assessment & Plan:  Acute Hypoxic Respiratory Failure / PEA Arrest while in CT 4/23 - appears to be multifactorial  from pulmonary edema, bilateral pleural effusions (possibly loculated), suspected aspiration and undiagnosed OSA/OHS, and poor respiratory mechanics. Tolerated pressure support yesterday x 2 hrs until receiving low dose IV benadryl for itching.  Plan SBT today, currently at 30 minutes and doing well  AM CXR reviewed, no improvement in effusions. Would recommend more aggressive fluid removal through CRRT as BP permits today and reassess tomorrow.  VAP protocol in place  Will continue to monitor effusions for change>> may need thora/ CT  if not responsive to fluid removal ( CVVH at present) Follow Sputum Cx - reincubated for better growth  Fentanyl gtt for sedation  Will need BiPAP at HS once extubated Will need OP follow up to assess for OSA at discharge  Transaminitis: AST/ALT 281/189, unclear etiology. He does have chronically elevated transaminases since 10/2018. I suspect possible hepatic steatosis from obesity. Abdomen imaging reviewed back to 09/2018, no evidence of focal liver abnormality. No history of alcohol use disorder. Platelets are normal. No INR checked.  On rosuvastatin  Checking GGT, CK, and hepatitis serologies  Consider RUQ US  Hepatic function panel tomorrow    ESRD with post-arrest hyperkalemia  Plan Nephrology following and managing CRRT  Continue CVVHD with goal -1-2 L daily; -1L yesterday  Monitor lytes  Trend BMET  daily  Hypotension post arrest Cardiogenic shock Plan Titrate/ wean Levophed for MAP goal of > 65 mm Hg Continue Vasopressin per cards CVP monitoring Rest per Primary Team  Post-arrest leukocytosis - has remains afebrile and WBC now trending down. Likle reactive. CXR this AM pending.  Plan Will continue to monitor off antibiotics for now Trend WBC and fever curve Culture Sputum 4/23  AoC anemia - Received 1 unit pRBC overnight. Hgb 8.1 this AM. No signs of overt bleeding on exam.  Plan Trend CBC Monitor for bleeding Transfuse for HGB < 8 (  Recent CABG)  Diarrhea - C diff negative 4/23 Plan Advance TF to goal as he has tolerated trickle overnight Concern for ileus mentioned in previous notes but having recurrent diarrhea and tolerating TFs  Hold laxatives and stool softener   Best practice:  Diet: Advance TF to goal Pain/Anxiety/Delirium protocol (if indicated): Fentanyl with prn Versed VAP protocol (if indicated): Initiated DVT prophylaxis: Eliquis GI prophylaxis: Protonix Glucose control: CBG with SSI Mobility: BR Code Status: Full Family Communication: per primary team  Disposition: ICU  Labs   CBC: Recent Labs  Lab 11/17/19 0537 11/17/19 1517 11/17/19 1611 11/17/19 2108 11/18/19 1101 11/18/19 1141 11/19/19 0426 11/19/19 1544 11/19/19 1635 11/20/19 0353 11/20/19 0626  WBC 11.7*  --  10.8*  --  18.0*  --   --  15.2*  --  13.4*  --   HGB 7.5*   < > 7.3*   < > 7.0*   < > 8.5* 6.9* 6.8* 8.1* 8.5*  HCT 25.1*   < > 24.1*   < > 23.7*   < > 25.0* 22.5* 22.3* 26.9* 25.0*  MCV 95.8  --  94.9  --  98.3  --   --  94.9  --  96.1  --   PLT 162  --  175  --  219  --   --  249  --  213  --    < > = values in this interval not displayed.    Basic Metabolic Panel: Recent Labs  Lab 11/15/19 0500 11/15/19 1519 11/16/19 0432 11/17/19 0537 11/18/19 1600 11/18/19 2028 11/19/19 0342 11/19/19 0342 11/19/19 0352 11/19/19 0426 11/19/19 1544 11/20/19 0353 11/20/19 0626  NA 132*   < > 134*   < > 133*  --  136   < > 136 139 134* 134* 139  K 4.3   < > 4.5   < > 7.1*   < > 5.5*   < > 5.5* 5.6* 4.8 4.8 4.5  CL 101   < > 103   < > 103  --  104  --  104  --  100 102  --   CO2 23   < > 23   < > 20*  --  24  --  23  --  23 26  --   GLUCOSE 180*   < > 184*   < > 364*  --  222*  --  220*  --  228* 251*  --   BUN 20   < > 24*   < > 80*  --  54*  --  54*  --  41* 36*  --   CREATININE 2.07*   < > 2.14*   < > 6.27*  --  3.72*  --  4.00*  --  2.85* 2.31*  --   CALCIUM 6.9*   < > 7.0*   < > 7.9*  --  7.5*  --  7.6*  --  7.4*  7.2*  --   MG 2.3  --  2.3  --  2.8*  --  2.6*  --   --   --   --  2.4  --   PHOS 1.5*   < > 1.7*  --  4.5  --   --   --  3.6  --  2.7 2.4*  --    < > = values in this interval not displayed.   GFR: Estimated Creatinine Clearance: 34.5 mL/min (A) (by C-G formula based on SCr of 2.31 mg/dL (H)). Recent Labs  Lab 11/17/19 1611 11/18/19 1101 11/18/19 1323 11/18/19 1611 11/19/19 0342 11/19/19 1544 11/20/19 0353  WBC 10.8* 18.0*  --   --   --  15.2* 13.4*  LATICACIDVEN  --   --  2.0* 1.7 1.1  --   --     Liver Function Tests: Recent Labs  Lab 11/18/19 1101 11/18/19 1101 11/18/19 1600 11/19/19 0342 11/19/19 0352 11/19/19 1544 11/20/19 0353  AST 113*  --   --  86*  --   --  251*  ALT 104*  --   --  101*  --   --  189*  ALKPHOS 190*  --   --  198*  --   --  264*  BILITOT 1.3*  --   --  1.6*  --   --  1.4*  PROT 7.5  --   --  8.7*  --   --  8.5*  ALBUMIN 1.5*   < > 1.8* 1.7* 1.7* 1.6* 1.7*   1.6*   < > = values in this interval not displayed.   Recent Labs  Lab 11/18/19 1101  LIPASE 83*   No results for input(s): AMMONIA in the last 168 hours.  ABG    Component Value Date/Time   PHART 7.375 11/20/2019 0626   PCO2ART 39.8 11/20/2019 0626   PO2ART 103 11/20/2019 0626   HCO3 23.4 11/20/2019 0626   TCO2 25 11/20/2019 0626   ACIDBASEDEF 2.0 11/20/2019 0626   O2SAT 98.0 11/20/2019 0626     Coagulation Profile: Recent Labs  Lab 11/18/19 1101  INR 2.6*    Cardiac Enzymes: No results for input(s): CKTOTAL, CKMB, CKMBINDEX, TROPONINI in the last 168 hours.  HbA1C: Hemoglobin A1C  Date/Time Value Ref Range Status  06/07/2019 10:54 AM 6.4 (A) 4.0 - 5.6 % Final   Hgb A1c MFr Bld  Date/Time Value Ref Range Status  10/27/2019 04:51 AM 6.2 (H) 4.8 - 5.6 % Final    Comment:    (NOTE) Pre diabetes:          5.7%-6.4% Diabetes:              >6.4% Glycemic control for   <7.0% adults with diabetes   08/20/2018 07:23 AM 6.9 (H) 4.8 - 5.6 % Final    Comment:     (NOTE) Pre diabetes:          5.7%-6.4% Diabetes:              >6.4% Glycemic control for   <7.0% adults with diabetes     CBG: Recent Labs  Lab 11/19/19 1514 11/19/19 1914 11/19/19 2306 11/20/19 0306 11/20/19 0723  GLUCAP 203* 230* 197* 187* 184*    Review of Systems:   Patient intubated and unable to verbalize complaints.   Past Medical History  He,  has a past medical history of Arthritis, Atrial flutter ([place]), Coronary artery disease, Erectile  dysfunction, ESRD (end stage renal disease) on dialysis [facility]), Essential hypertension, Gout, Hernia of abdominal wall, History of blood transfusion, History of cardiomyopathy, History of kidney stones, History of pneumonia, Migraine, Myocardial infarction ([place]), Pneumonia, Secondary hyperparathyroidism ([place]), Type 2 diabetes mellitus ([place]), and Wears glasses.   Surgical History    Past Surgical History:  Procedure Laterality Date   A-FLUTTER ABLATION N/A 09/24/2016   Procedure: A-Flutter Ablation;  Surgeon: Will Meredith Leeds, MD;  Location: [place] [facility];  Service: Cardiovascular;  Laterality: N/A;   ABDOMINAL AORTOGRAM W/LOWER EXTREMITY N/A 04/13/2017   Procedure: ABDOMINAL AORTOGRAM W/LOWER EXTREMITY;  Surgeon: Conrad Dalton, MD;  Location: [place] [facility];  Service: Cardiovascular;  Laterality: N/A;  Bilater lower extermity   AORTIC VALVE REPLACEMENT N/A 10/27/2019   Procedure: AORTIC VALVE REPLACEMENT (AVR) USING EDWARDS INTUITY 23 MM AORTIC VALVE.;  Surgeon: Wonda Olds, MD;  Location: [place];  Service: Open Heart Surgery;  Laterality: N/A;   APPENDECTOMY     AV FISTULA PLACEMENT Right 09/18/2014   Procedure: INSERTION OF ARTERIOVENOUS (AV) GORE-TEX GRAFT ARM USING 4-7MM  X 45CM STRETCH GORE-TEX VASCULAR GRAFT;  Surgeon: Rosetta Posner, MD;  Location: [place];  Service: Vascular;  Laterality: Right;   AV FISTULA PLACEMENT Left 07/07/2016   Procedure: INSERTION OF LEFT BRACHIAL TO AXILLARY ARTERIOVENOUS (AV) GORE-TEX  ARM GRAFT;  Surgeon: Conrad Pana, MD;  Location: [place];  Service: Vascular;  Laterality: Left;   AV FISTULA PLACEMENT Left 12/09/2018   Procedure: Insertion Of Arteriovenous (Av) Gore-Tex Graft Arm, left arm;  Surgeon: Marty Heck, MD;  Location: [place];  Service: Vascular;  Laterality: Left;   CARDIAC CATHETERIZATION  ~ 2016   COLONOSCOPY     CORONARY ARTERY BYPASS GRAFT  1997   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./notes 09/04/2005   CORONARY ARTERY BYPASS GRAFT N/A 10/27/2019   Procedure: REDO CORONARY ARTERY BYPASS GRAFTING TIMES 3  USING LEFT GREATER SAPHENOUS LEG VEIN HARVESTED ENDOSCOPICALLY(CABG);  Surgeon: Wonda Olds, MD;  Location: [facility];  Service: Open Heart Surgery;  Laterality: N/A;   CORONARY ATHERECTOMY N/A 02/16/2019   Procedure: CORONARY ATHERECTOMY;  Surgeon: Martinique, Peter M, MD;  Location: [place] [facility] [facility];  Service: Cardiovascular;  Laterality: N/A;   ENDOVEIN HARVEST OF GREATER SAPHENOUS VEIN Left 10/27/2019   Procedure: [place] Of Greater Saphenous Vein;  Surgeon: Wonda Olds, MD;  Location: [facility];  Service: Open Heart Surgery;  Laterality: Left;   EXCHANGE OF A DIALYSIS CATHETER Left 01/11/2018   Procedure: EXCHANGE OF TUNNELED DIALYSIS CATHETER;  Surgeon: Rosetta Posner, MD;  Location: [place];  Service: Vascular;  Laterality: Left;   HERNIA REPAIR  2017   with nephrectomy   INSERTION OF DIALYSIS CATHETER N/A 10/08/2017   Procedure: INSERTION OF TUNNELED DIALYSIS CATHETER;  Surgeon: Conrad Dryville, MD;  Location: [facility];  Service: Vascular;  Laterality: N/A;   IR FLUORO GUIDE CV LINE LEFT  03/12/2018   IR FLUORO GUIDE CV LINE RIGHT  10/31/2019   IR GENERIC HISTORICAL  05/11/2016   IR FLUORO GUIDE CV LINE LEFT 05/11/2016 Corrie Mckusick, DO [facility]   IR GENERIC HISTORICAL  05/11/2016   IR US GUIDE VASC ACCESS LEFT 05/11/2016 Corrie Mckusick, DO [facility]   IR GENERIC HISTORICAL  05/11/2016   IR US GUIDE VASC  ACCESS RIGHT 05/11/2016 Corrie Mckusick, DO [facility]   IR GENERIC HISTORICAL  05/11/2016   IR RADIOLOGY PERIPHERAL GUIDED  IV START 05/11/2016 Corrie Mckusick, DO [facility]   IR PTA VENOUS EXCEPT DIALYSIS CIRCUIT  10/31/2019   IR US GUIDE VASC ACCESS RIGHT  10/31/2019   KIDNEY TRANSPLANT  2009   LEFT HEART CATH AND CORONARY ANGIOGRAPHY N/A 02/15/2019   Procedure: LEFT HEART CATH AND CORONARY ANGIOGRAPHY;  Surgeon: Martinique, Peter M, MD;  Location: [place];  Service: Cardiovascular;  Laterality: N/A;   LEFT HEART CATH AND CORS/GRAFTS ANGIOGRAPHY N/A 12/04/2017   Procedure: LEFT HEART CATH AND CORS/GRAFTS ANGIOGRAPHY;  Surgeon: Belva Crome, MD;  Location: [place] [facility];  Service: Cardiovascular;  Laterality: N/A;   LEFT HEART CATH AND CORS/GRAFTS ANGIOGRAPHY N/A 10/25/2019   Procedure: LEFT HEART CATH AND CORS/GRAFTS ANGIOGRAPHY;  Surgeon: Martinique, Peter M, MD;  Location: [facility];  Service: Cardiovascular;  Laterality: N/A;   NEPHRECTOMY  2017   transplant rejected    PERIPHERAL VASCULAR CATHETERIZATION N/A 06/04/2016   Procedure: Upper Extremity Venography;  Surgeon: Waynetta Sandy, MD;  Location: [place] [facility];  Service: Cardiovascular;  Laterality: N/A;   PERIPHERAL VASCULAR INTERVENTION  04/13/2017   Procedure: PERIPHERAL VASCULAR INTERVENTION;  Surgeon: Conrad Vanduser, MD;  Location: [facility];  Service: Cardiovascular;;  Lt. Common/Exernal  Iliac   TEE WITHOUT CARDIOVERSION N/A 10/27/2019   Procedure: TRANSESOPHAGEAL ECHOCARDIOGRAM (TEE);  Surgeon: Wonda Olds, MD;  Location: [place];  Service: Open Heart Surgery;  Laterality: N/A;   THROMBECTOMY AND REVISION OF ARTERIOVENTOUS (AV) GORETEX  GRAFT Left 10/08/2017   Procedure: THROMBECTOMY of ARTERIOVENTOUS (AV) GORETEX  GRAFT LEFT UPPER ARM;  Surgeon: Conrad Marenisco, MD;  Location: [place];  Service: Vascular;  Laterality: Left;   THROMBECTOMY W/ EMBOLECTOMY Left 09/14/2017   Procedure:  THROMBECTOMY ARTERIOVENOUS GORE-TEX GRAFT LEFT UPPER ARM;  Surgeon: Rosetta Posner, MD;  Location: [place];  Service: Vascular;  Laterality: Left;   UPPER EXTREMITY ANGIOGRAPHY Bilateral 10/29/2018   Procedure: UPPER EXTREMITY ANGIOGRAPHY;  Surgeon: Marty Heck, MD;  Location: [facility];  Service: Cardiovascular;  Laterality: Bilateral;   UPPER EXTREMITY VENOGRAPHY N/A 11/16/2017   Procedure: UPPER EXTREMITY VENOGRAPHY - Right Arm;  Surgeon: Conrad [place], MD;  Location: [place] [facility];  Service: Cardiovascular;  Laterality: N/A;   UPPER EXTREMITY VENOGRAPHY Bilateral 10/29/2018   Procedure: UPPER EXTREMITY VENOGRAPHY;  Surgeon: Marty Heck, MD;  Location: [place] [facility];  Service: Cardiovascular;  Laterality: Bilateral;     Social History   reports that he quit smoking about 5 years ago. His smoking use included cigarettes. He has never used smokeless tobacco. He reports current alcohol use. He reports that he does not use drugs.   Family History   His family history includes HIV in his sister; Hyperlipidemia in his mother; Hypertension in his mother.   Allergies Allergies  Allergen Reactions   Baclofen Other (See Comments)    Possibly stroke like symptoms   Iodinated Diagnostic Agents Swelling, Rash and Other (See Comments)    Other Reaction: burning to mouth, swelling of lips   Lipitor [Atorvastatin] Other (See Comments)    Leg pain   Metoprolol Other (See Comments)    Headaches      Home Medications  Prior to Admission medications   Medication Sig Start Date End Date Taking? Authorizing Provider  acetaminophen (TYLENOL) 500 MG tablet Take 1,000 mg by mouth every 6 (six) hours as needed for moderate pain or headache.   Yes [provider]  albuterol (PROVENTIL HFA;VENTOLIN HFA) 108 (90 Base) MCG/ACT inhaler Inhale 1-2 puffs into the lungs every 6 (six) hours as needed for wheezing or shortness of breath. 12/18/17  Yes Richardson Dopp T, PA-C    aspirin EC 81 MG tablet Take 81 mg by mouth daily.    Yes [provider]  calcium acetate (PHOSLO) 667 MG capsule Take 1,334-2,001 mg by mouth See admin instructions. 3 capsules with dinner and 2 capsules with snacks   Yes [provider]  calcium carbonate (OS-CAL - DOSED IN MG OF ELEMENTAL CALCIUM) 1250 (500 Ca) MG tablet Take 2 tablets by mouth daily.  11/29/13  Yes [provider]  clopidogrel (PLAVIX) 75 MG tablet TAKE 1 TABLET(75 MG) BY MOUTH DAILY Patient taking differently: Take 75 mg by mouth daily.  03/03/19  Yes Richardson Dopp T, PA-C  Colchicine 0.6 MG CAPS Take 0.6 mg by mouth See admin instructions. Take 0.6 mg when first symptoms of gout flare occur, then take another 0.6 mg 14 days later as needed for gout 10/13/18  Yes [provider]  dextromethorphan (DELSYM) 30 MG/5ML liquid Take 30 mg by mouth as needed for cough.   Yes [provider]  Dextromethorphan-guaiFENesin 5-100 MG/5ML LIQD Take 15 mLs by mouth 2 (two) times daily as needed (Cough).  06/03/18  Yes [provider]  docusate sodium (COLACE) 100 MG capsule Take 100 mg by mouth daily as needed for mild constipation.   Yes [provider]  ezetimibe (ZETIA) 10 MG tablet TAKE 1 TABLET(10 MG) BY MOUTH DAILY Patient taking differently: Take 10 mg by mouth daily.  05/09/19  Yes Weaver, Scott T, PA-C  isosorbide mononitrate (IMDUR) 30 MG 24 hr tablet Take 1 tablet (30 mg total) by mouth daily. 02/24/19  Yes Daune Perch, NP  lidocaine-prilocaine (EMLA) cream Apply 1 application topically daily as needed (Numbing).  03/21/19  Yes [provider]  metoprolol succinate (TOPROL XL) 25 MG 24 hr tablet Take 1 tablet (25 mg total) by mouth daily. 05/05/19  Yes Daune Perch, NP  multivitamin (RENA-VIT) TABS tablet Take 1 tablet by mouth daily.    Yes [provider]  neomycin-bacitracin-polymyxin (NEOSPORIN) ointment Apply 1 application topically 2 (two) times  daily. Apply to feet.   Yes [provider]  nitroGLYCERIN (NITROSTAT) 0.4 MG SL tablet Place 1 tablet (0.4 mg total) under the tongue every 5 (five) minutes as needed for chest pain. 09/16/19 12/15/19 Yes Fay Records, MD  rosuvastatin (CRESTOR) 10 MG tablet Take 1 tablet (10 mg total) by mouth daily. 02/24/19  Yes Daune Perch, NP  silver sulfADIAZINE (SILVADENE) 1 % cream Apply 1 application topically daily. Patient taking differently: Apply 1 application topically 2 (two) times daily.  10/18/19  Yes Trula Slade, DPM  traMADol (ULTRAM) 50 MG tablet Take 50 mg by mouth 2 (two) times daily as needed for moderate pain.  05/19/18  Yes [provider]  doxycycline (VIBRA-TABS) 100 MG tablet Take 1 tablet (100 mg total) by mouth 2 (two) times daily. Patient not taking: Reported on 10/24/2019 09/06/19   Trula Slade, DPM  Methoxy PEG-Epoetin Beta (MIRCERA IJ) Mircera 09/05/19 09/03/20  [provider]  oxyCODONE-acetaminophen (PERCOCET/ROXICET) 5-325 MG tablet Take 1 tablet by mouth every 6 (six) hours as needed. Patient not taking: Reported on 10/24/2019 12/09/18   Ulyses Amor, PA-C  pantoprazole (PROTONIX) 40 MG tablet Take 1 tablet (40 mg total) by mouth daily. 11/17/19   Fay Records, MD  repaglinide (PRANDIN) 0.5 MG tablet Take 1 tablet (0.5 mg total) by mouth 2 (two) times daily before a meal. OVERDUE FOR AN APPT. WILL PROVIDE 30 DAY SUPPLY ONLY. MUST CALL OFFICE 11/03/19   Renato Shin, MD

## 2019-11-20 NOTE — Progress Notes (Signed)
[name] Progress Note  Subjective: K+ better, 990 cc net neg yesterday. On vent and awakens easily  Vitals:   11/20/19 0900 11/20/19 1000 11/20/19 1126 11/20/19 1155  BP:    (!) 108/52  Pulse: 90 92  92  Resp: (!) 22 14  17   Temp:   98.3 F (36.8 C)   TempSrc:   Oral   SpO2: 94% 90%  92%  Weight:      Height:        Exam:  alert, chron ill appearing, on vent   No jvd  Chest cta ant/lat   Cor reg     Abd obese ntnd    Ext no leg edema, 1+ bilat uE edema    NF, Ox3,gen weakness      Dialysis: [name] MWF 4h 99.5 2K/3.5Ca P4 TDC Hep 3000 - mircera 75 last 3/22 - hect 1  Assessment/ Plan: 1. NSTEMI/CAD - s/p redo CABG 02/01/66 complicated by shock 2. Cardiogenic shock - on vaso/ levo gtt's, per cardiology 3. Resp failure - d/t pulm edema and prior asp PNA. Back on vent after resp arrest in CT 4/23.   4. Volume - 5-6 kg under dry wt. Pulling fluid 75- 100 cc/ hr 5. ESRD - HD MWF.  CRRT started 4/1 heredue to refractory hypotension and vol excess. Was off CRRT 4/21-22. Back on 4/23, continue.  6. Atrial Fibrillation- per cardiology on po amio now and off BB"s 7. Anemia abl/ ckd - tsat 16%, ferr 1293. StartedAranesp60 qwk SQ 1st dose 4/10.If Hb continues to drop will need blood transfusion. 8. S/p AVR- good valve function per ECHO. 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 SP IV sodium phos 10. Protein malnutrition- severe with low albumin. On cortrak TF's.   Patrick [name] 11/20/2019, 1:46 PM   Recent Labs  Lab 11/19/19 1544 11/19/19 1635 11/20/19 0353 11/20/19 0626  K 4.8   < > 4.8 4.5  BUN 41*  --  36*  --   CREATININE 2.85*  --  2.31*  --   CALCIUM 7.4*  --  7.2*  --   PHOS 2.7  --  2.4*  --   HGB 6.9*   < > 8.1* 8.5*   < > = values in this interval not displayed.   Inpatient medications: . amiodarone  200 mg Per Tube BID  . chlorhexidine gluconate (MEDLINE KIT)  15 mL Mouth Rinse BID  . Chlorhexidine Gluconate  Cloth  6 each Topical Daily  . clopidogrel  75 mg Per Tube Daily  . darbepoetin (ARANESP) injection - NON-DIALYSIS  60 mcg Subcutaneous Q Sat-1800  . docusate  100 mg Per Tube BID  . feeding supplement (ENSURE ENLIVE)  237 mL Oral TID BM  . feeding supplement (PRO-STAT SUGAR FREE 64)  30 mL Per Tube 5 X Daily  . feeding supplement (VITAL 1.5 CAL)  1,000 mL Per Tube Q24H  . insulin aspart  0-9 Units Subcutaneous TID AC & HS  . mouth rinse  15 mL Mouth Rinse 10 times per day  . midodrine  15 mg Per Tube TID WC  . multivitamin  1 tablet Per Tube QHS  . pantoprazole sodium  40 mg Per Tube Daily  . rosuvastatin  10 mg Per Tube q1800  . sodium chloride flush  10-40 mL Intracatheter Q12H   .  prismasol BGK 4/2.5 400 mL/hr at 11/18/19 1410  . sodium chloride 10 mL/hr at 11/20/19 1300  .  sodium chloride    . sodium chloride    . fentaNYL infusion INTRAVENOUS 75 mcg/hr (11/20/19 1300)  . norepinephrine (LEVOPHED) Adult infusion 10 mcg/min (11/20/19 1300)  . prismasol BGK 0/2.5 300 mL/hr at 11/19/19 1339  . prismasol BGK 4/2.5 1,800 mL/hr at 11/20/19 1026  . vasopressin (PITRESSIN) infusion - *FOR SHOCK* 0.03 Units/min (11/20/19 1300)   sodium chloride, Place/Maintain arterial line **AND** sodium chloride, Place/Maintain arterial line **AND** sodium chloride, acetaminophen (TYLENOL) oral liquid 160 mg/5 mL, alteplase, dextrose, diphenhydrAMINE, fentaNYL, fentaNYL (SUBLIMAZE) injection, Gerhardt's butt cream, heparin, [START ON 11/21/2019] heparin, levalbuterol, metoprolol tartrate, midazolam, ondansetron (ZOFRAN) IV, oxyCODONE, sodium chloride, sodium chloride flush, sodium chloride flush

## 2019-11-20 NOTE — Progress Notes (Signed)
Patient ID: Patrick [name], male   DOB: July 19, 1956, 64 y.o.   MRN: [id number]     Advanced Heart Failure Rounding Note  PCP-Cardiologist: Dorris Carnes, MD     Patient Profile   64 y/o male w/ h/o CAD s/p remote CABG in 1997 w/ LIMA-LAD, failed kidney transplant, end-stage renal disease on dialysis admitted w/ CP. LHC w/ severe CAD. Echo w/ reduced EF 35-40% and moderate AI. S/p redo CABG x 3 on 4/1 (SVG-LAD, SVG-PDA, SVG-OM) + bioprosthetic AVR. Post operative course c/b by cardiogenic shock, respiratory failure, VT, and protein malnutrition.    Subjective:    Patient had respiratory arrest with PEA while in CT scanner yesterday., had 5 minutes CPR with drugs, achieved ROSC and was intubated.  K noted > 7.    Remains intubated. Awake on vent. Follows commands. Failed PS trial yesterday due to apnea after receiving benadryl.   On CVVHD pulling -100 now (was - 50). Weight down to 206. (same as pre-op)  On NE 20 -> 8 VP 0.03. Co-ox 50% CVP 8  CXR with bilateral effusions vs atx.    Objective:   Weight Range: 93.6 kg Body mass index is 32.81 kg/m.   Vital Signs:   Temp:  [97.8 F (36.6 C)-99.1 F (37.3 C)] 98.2 F (36.8 C) (04/25 0725) Pulse Rate:  [61-94] 92 (04/25 1000) Resp:  [9-27] 14 (04/25 1000) BP: (90-149)/(42-123) 111/53 (04/25 0751) SpO2:  [56 %-100 %] 90 % (04/25 1000) Arterial Line BP: (86-135)/(43-57) 109/53 (04/25 1000) FiO2 (%):  [40 %] 40 % (04/25 1000) Weight:  [93.6 kg] 93.6 kg (04/25 0500) Last BM Date: 11/19/19  Weight change: Filed Weights   11/16/19 0400 11/17/19 0402 11/20/19 0500  Weight: 96.6 kg 98.7 kg 93.6 kg    Intake/Output:   Intake/Output Summary (Last 24 hours) at 11/20/2019 1121 Last data filed at 11/20/2019 1100 Gross per 24 hour  Intake 2819.95 ml  Output 4079 ml  Net -1259.05 ml      Physical Exam    General:  Awake on vent. Follows commands HEENT: normal Neck: supple. no JVD. Carotids 2+ bilat; no bruits. No  lymphadenopathy or thryomegaly appreciated. Cor: PMI nondisplaced. Regular rate & rhythm. No rubs, gallops or murmurs. Lungs: clear decreased in bases Abdomen: soft, nontender, nondistended. No hepatosplenomegaly. No bruits or masses. Good bowel sounds. Extremities: no cyanosis, clubbing, rash, edema Neuro: alert & orientedx3, cranial nerves grossly intact. moves all 4 extremities w/o difficulty. Affect pleasant   Telemetry   A-paced at 90-94 underlying sinus brady/junctional 40-50s (personally reviewed).  Labs    CBC Recent Labs    11/19/19 1544 11/19/19 1635 11/20/19 0353 11/20/19 0626  WBC 15.2*  --  13.4*  --   HGB 6.9*   < > 8.1* 8.5*  HCT 22.5*   < > 26.9* 25.0*  MCV 94.9  --  96.1  --   PLT 249  --  213  --    < > = values in this interval not displayed.   Basic Metabolic Panel Recent Labs    11/19/19 0342 11/19/19 0352 11/19/19 1544 11/19/19 1544 11/20/19 0353 11/20/19 0626  NA 136   < > 134*   < > 134* 139  K 5.5*   < > 4.8   < > 4.8 4.5  CL 104   < > 100  --  102  --   CO2 24   < > 23  --  26  --   GLUCOSE 222*   < >  228*  --  251*  --   BUN 54*   < > 41*  --  36*  --   CREATININE 3.72*   < > 2.85*  --  2.31*  --   CALCIUM 7.5*   < > 7.4*  --  7.2*  --   MG 2.6*  --   --   --  2.4  --   PHOS  --    < > 2.7  --  2.4*  --    < > = values in this interval not displayed.   Liver Function Tests Recent Labs    11/19/19 0342 11/19/19 0352 11/19/19 1544 11/20/19 0353  AST 86*  --   --  251*  ALT 101*  --   --  189*  ALKPHOS 198*  --   --  264*  BILITOT 1.6*  --   --  1.4*  PROT 8.7*  --   --  8.5*  ALBUMIN 1.7*   < > 1.6* 1.7*  1.6*   < > = values in this interval not displayed.   Recent Labs    11/18/19 1101  LIPASE 83*   Cardiac Enzymes No results for input(s): CKTOTAL, CKMB, CKMBINDEX, TROPONINI in the last 72 hours.  BNP: BNP (last 3 results) Recent Labs    11/20/19 0353  BNP 374.6*    ProBNP (last 3 results) No results for  input(s): PROBNP in the last 8760 hours.   D-Dimer No results for input(s): DDIMER in the last 72 hours. Hemoglobin A1C No results for input(s): HGBA1C in the last 72 hours. Fasting Lipid Panel No results for input(s): CHOL, HDL, LDLCALC, TRIG, CHOLHDL, LDLDIRECT in the last 72 hours. Thyroid Function Tests No results for input(s): TSH, T4TOTAL, T3FREE, THYROIDAB in the last 72 hours.  Invalid input(s): FREET3  Other results:   Imaging    DG CHEST PORT 1 VIEW  Result Date: 11/20/2019 CLINICAL DATA:  Chest pain and shortness of breath EXAM: PORTABLE CHEST 1 VIEW COMPARISON:  November 19, 2019 FINDINGS: The ETT is in good position. The right PICC line is stable. The left central line is stable as well. The feeding tube terminates below today's film. A left-sided transcutaneous pacer is identified. Cardiomegaly and mild pulmonary venous congestion are seen. Mildly more focal opacities in the bases persists. IMPRESSION: 1. Cardiomegaly and mild pulmonary venous congestion or seen in stable. 2. Mildly more focal opacities in the bases are favored represent atelectasis. Electronically Signed   By: Dorise Bullion III M.D   On: 11/20/2019 10:00     Medications:     Scheduled Medications: . amiodarone  200 mg Per Tube BID  . chlorhexidine gluconate (MEDLINE KIT)  15 mL Mouth Rinse BID  . Chlorhexidine Gluconate Cloth  6 each Topical Daily  . clopidogrel  75 mg Per Tube Daily  . darbepoetin (ARANESP) injection - NON-DIALYSIS  60 mcg Subcutaneous Q Sat-1800  . docusate  100 mg Per Tube BID  . feeding supplement (ENSURE ENLIVE)  237 mL Oral TID BM  . feeding supplement (PRO-STAT SUGAR FREE 64)  30 mL Per Tube 5 X Daily  . feeding supplement (VITAL 1.5 CAL)  1,000 mL Per Tube Q24H  . insulin aspart  0-9 Units Subcutaneous TID AC & HS  . mouth rinse  15 mL Mouth Rinse 10 times per day  . midodrine  15 mg Per Tube TID WC  . multivitamin  1 tablet Per Tube QHS  . pantoprazole sodium  40 mg  Per Tube Daily  . rosuvastatin  10 mg Per Tube q1800  . sodium chloride flush  10-40 mL Intracatheter Q12H    Infusions: .  prismasol BGK 4/2.5 400 mL/hr at 11/18/19 1410  . sodium chloride 10 mL/hr at 11/20/19 1100  . sodium chloride    . sodium chloride    . fentaNYL infusion INTRAVENOUS 30 mcg/hr (11/20/19 1100)  . norepinephrine (LEVOPHED) Adult infusion 8 mcg/min (11/20/19 1100)  . prismasol BGK 0/2.5 300 mL/hr at 11/19/19 1339  . prismasol BGK 4/2.5 1,800 mL/hr at 11/20/19 1026  . vasopressin (PITRESSIN) infusion - *FOR SHOCK* 0.03 Units/min (11/20/19 1100)    PRN Medications: sodium chloride, Place/Maintain arterial line **AND** sodium chloride, Place/Maintain arterial line **AND** sodium chloride, acetaminophen (TYLENOL) oral liquid 160 mg/5 mL, alteplase, dextrose, diphenhydrAMINE, fentaNYL, fentaNYL (SUBLIMAZE) injection, Gerhardt's butt cream, heparin, [START ON 11/21/2019] heparin, levalbuterol, metoprolol tartrate, midazolam, ondansetron (ZOFRAN) IV, oxyCODONE, sodium chloride, sodium chloride flush, sodium chloride flush   Assessment/Plan   1. Cardiogenic shock: Post-op.  Echo 4/14 with EF 30-35%, stable aortic valve replacement.  Echo on 4/21 with EF 45-50% and significant respirophasic septal variation.  Pericardium in some places appears thickened though windows are poor.  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 CT chest 4/23, difficult images as the patient became unstable during the scan. There did not appear to be significant pericardial pathology. S/p PEA arrest 4/23. Now on NE 20 ->8  VP 0.03, midodrine 15 tid.  CVP 8 co-ox low at 50% - Wean pressors slowly as tolerated  Will stay on midodrine.   - Discussed with CCM. Feels like he needs more volume off before he can be safely extubated.  Will continue CVVHD at -75-100 as tolerated but given low co-ox may not be able to get preload much lower  2. ESRD:  - continue CVVHD  as above. Renal following 3. Possible aspiration PNA: He has completed empiric vanco/meropenem.  - CXR today without focal infiltrate  4. Atrial fibrillation: Paroxysmal.  Has underlying sinus brady/junctional. Now a-paced at 90.  - Continue amiodarone 200 mg bid for now. May have to hold - Now off apixaban after arrest. Not sure why he is not on heparin. Will d/w TCTS 5. Anemia, post-op:  - Hgb 6.9 yesterday. Received 1u RBCs. Hgb 8.1 today 6. CAD: Redo CABG x 3 this admission on 4/1 (SVG-LAD, SVG-PDA, SVG-OM).   - no s/s ischemia - Continue Plavix and Crestor.  7. VT:  Frequent NSVT, now on Amiodarone gtt improvement. - Keep K> 4.0 Mg > 2.0  - Off b-blocker with shock 8 Acute hypoxic respiratory failure: due to pulmonary edema and aspiration PNA - re-intubated after PEA arrest 4/23 - continue to wean vent as tolerated. See discussion above. - CCM wants to try one more day of fluid removal  - probable bilateral effusions on CXR. May need drainage 9. ID: Tm 99.1 - Cultures NGTD - C diff negative - Sputum GS 4/23. Rare GNR 10. PEA arrest on 4/23 - resuscitated with CPR - suspect due to hypoxia  CRITICAL CARE Performed by: Glori Bickers  Total critical care time: 35 minutes  Critical care time was exclusive of separately billable procedures and treating other patients.  Critical care was necessary to treat or prevent imminent or life-threatening deterioration.  Critical care was time spent personally by me on the following activities: development of treatment plan with patient and/or surrogate as well as nursing, discussions with consultants, evaluation of patient's  response to treatment, examination of patient, obtaining history from patient or surrogate, ordering and performing treatments and interventions, ordering and review of laboratory studies, ordering and review of radiographic studies, pulse oximetry and re-evaluation of patient's condition.  Length of Stay:  Norwood [place], MD  11/20/2019, 11:21 AM  Advanced Heart Failure Team Pager [phone] (M-F; 7a - 4p)

## 2019-11-20 NOTE — Progress Notes (Signed)
Patient ID: Patrick [name], male   DOB: Mar 07, 1956, 64 y.o.   MRN: [id number] TCTS Evening Rounds:  Stable on vent. Removing volume with CRRT. Had to increase NE from 8 to 14. CVP 7.  BMET    Component Value Date/Time   NA 134 (L) 11/20/2019 1522   NA 139 11/30/2017 1008   K 5.0 11/20/2019 1522   CL 101 11/20/2019 1522   CO2 27 11/20/2019 1522   GLUCOSE 199 (H) 11/20/2019 1522   BUN 33 (H) 11/20/2019 1522   BUN 48 (H) 11/30/2017 1008   CREATININE 2.00 (H) 11/20/2019 1522   CALCIUM 7.2 (L) 11/20/2019 1522   GFRNONAA 34 (L) 11/20/2019 1522   GFRAA 39 (L) 11/20/2019 1522

## 2019-11-20 NOTE — Progress Notes (Signed)
0700-1100 Pt RASS -1, follows commands, nods appropriately Fentanyl decreased to 20mcg for [facility] UF goal increased to 75-100cc/hr; tolerating well CCM considering extubation vs holding for more fluid removal Wife, Freda Munro, called and updated  1200-1500 Fluid removal goal increased to 150cc/hr per CCM and HF MDs Extubation delayed for today. Fentanyl back to 75  1600-1900 R calf fasciculations; family endorses these "tremors" at home r/t BP meds Multiple loose BMs over shift; FMS restarted

## 2019-11-20 NOTE — Progress Notes (Signed)
24 Days Post-Op Procedure(s) (LRB): REDO CORONARY ARTERY BYPASS GRAFTING TIMES 3  USING LEFT GREATER SAPHENOUS LEG VEIN HARVESTED ENDOSCOPICALLY(CABG) (N/A) TRANSESOPHAGEAL ECHOCARDIOGRAM (TEE) (N/A) AORTIC VALVE REPLACEMENT (AVR) USING EDWARDS INTUITY 23 MM AORTIC VALVE. (N/A) Endovein Harvest Of Greater Saphenous Vein (Left) Subjective:  Intubated on fentanyl 75 mcg/hr but following commands. Fairly calm.  Objective: Vital signs in last 24 hours: Temp:  [97.8 F (36.6 C)-99.1 F (37.3 C)] 98.3 F (36.8 C) (04/25 1126) Pulse Rate:  [61-94] 92 (04/25 1155) Cardiac Rhythm: Atrial paced (04/25 1000) Resp:  [14-27] 17 (04/25 1155) BP: (90-149)/(42-123) 108/52 (04/25 1155) SpO2:  [56 %-100 %] 92 % (04/25 1155) Arterial Line BP: (95-135)/(43-57) 109/53 (04/25 1000) FiO2 (%):  [40 %] 40 % (04/25 1156) Weight:  [93.6 kg] 93.6 kg (04/25 0500)  Hemodynamic parameters for last 24 hours: CVP:  [6 mmHg-14 mmHg] 14 mmHg  Intake/Output from previous day: 04/24 0701 - 04/25 0700 In: 2622 [I.V.:1137; Blood:545; NG/GT:940] Out: 3674  Intake/Output this shift: Total I/O In: 611 [I.V.:181; NG/GT:430] Out: 1213 [Other:1213]  General appearance: sedated but cooperative Neurologic: intact Heart: regular rate and rhythm, S1, S2 normal, no murmur Lungs: clear to auscultation bilaterally Abdomen: soft, non-tender; bowel sounds normal Extremities: edema mild Wound: chest incision intact with some eschar, dry  Lab Results: Recent Labs    11/19/19 1544 11/19/19 1635 11/20/19 0353 11/20/19 0626  WBC 15.2*  --  13.4*  --   HGB 6.9*   < > 8.1* 8.5*  HCT 22.5*   < > 26.9* 25.0*  PLT 249  --  213  --    < > = values in this interval not displayed.   BMET:  Recent Labs    11/19/19 1544 11/19/19 1544 11/20/19 0353 11/20/19 0626  NA 134*   < > 134* 139  K 4.8   < > 4.8 4.5  CL 100  --  102  --   CO2 23  --  26  --   GLUCOSE 228*  --  251*  --   BUN 41*  --  36*  --   CREATININE  2.85*  --  2.31*  --   CALCIUM 7.4*  --  7.2*  --    < > = values in this interval not displayed.    PT/INR:  Recent Labs    11/18/19 1101  LABPROT 28.1*  INR 2.6*   ABG    Component Value Date/Time   PHART 7.375 11/20/2019 0626   HCO3 23.4 11/20/2019 0626   TCO2 25 11/20/2019 0626   ACIDBASEDEF 2.0 11/20/2019 0626   O2SAT 98.0 11/20/2019 0626   CBG (last 3)  Recent Labs    11/20/19 0306 11/20/19 0723 11/20/19 1110  GLUCAP 187* 184* 167*   CLINICAL DATA:  Chest pain and shortness of breath  EXAM: PORTABLE CHEST 1 VIEW  COMPARISON:  November 19, 2019  FINDINGS: The ETT is in good position. The right PICC line is stable. The left central line is stable as well. The feeding tube terminates below today's film. A left-sided transcutaneous pacer is identified. Cardiomegaly and mild pulmonary venous congestion are seen. Mildly more focal opacities in the bases persists.  IMPRESSION: 1. Cardiomegaly and mild pulmonary venous congestion or seen in stable. 2. Mildly more focal opacities in the bases are favored represent atelectasis.   Electronically Signed   By: Dorise Bullion III M.D   On: 11/20/2019 10:00  Assessment/Plan: S/P Procedure(s) (LRB): REDO CORONARY ARTERY BYPASS GRAFTING TIMES 3  USING LEFT GREATER SAPHENOUS LEG VEIN HARVESTED ENDOSCOPICALLY(CABG) (N/A) TRANSESOPHAGEAL ECHOCARDIOGRAM (TEE) (N/A) AORTIC VALVE REPLACEMENT (AVR) USING EDWARDS INTUITY 23 MM AORTIC VALVE. (N/A) Endovein Harvest Of Greater Saphenous Vein (Left)  Hemodynamically stable on NE 8 and vaso 0.03. Co-ox 50% and CVP 8. Consider transfusing further if needed to increase Co-ox.  VDRF following arrest. Plan to keep on vent today to remove more fluid with CRRT. He may benefit from left thoracentesis. He had significant bilateral pleural effusions on his CT.  Removing fluid with CRRT.  Shock liver resolving.   LOS: 27 days    Gaye Pollack 11/20/2019

## 2019-11-21 ENCOUNTER — Inpatient Hospital Stay (HOSPITAL_COMMUNITY): Payer: Medicare HMO

## 2019-11-21 DIAGNOSIS — I214 Non-ST elevation (NSTEMI) myocardial infarction: Secondary | ICD-10-CM | POA: Diagnosis not present

## 2019-11-21 DIAGNOSIS — Z9911 Dependence on respirator [ventilator] status: Secondary | ICD-10-CM | POA: Diagnosis not present

## 2019-11-21 DIAGNOSIS — J90 Pleural effusion, not elsewhere classified: Secondary | ICD-10-CM

## 2019-11-21 DIAGNOSIS — I469 Cardiac arrest, cause unspecified: Secondary | ICD-10-CM | POA: Diagnosis not present

## 2019-11-21 DIAGNOSIS — N186 End stage renal disease: Secondary | ICD-10-CM | POA: Diagnosis not present

## 2019-11-21 LAB — HEPATIC FUNCTION PANEL
ALT: 156 U/L — ABNORMAL HIGH (ref 0–44)
AST: 131 U/L — ABNORMAL HIGH (ref 15–41)
Albumin: 1.7 g/dL — ABNORMAL LOW (ref 3.5–5.0)
Alkaline Phosphatase: 324 U/L — ABNORMAL HIGH (ref 38–126)
Bilirubin, Direct: 0.9 mg/dL — ABNORMAL HIGH (ref 0.0–0.2)
Indirect Bilirubin: 1 mg/dL — ABNORMAL HIGH (ref 0.3–0.9)
Total Bilirubin: 1.9 mg/dL — ABNORMAL HIGH (ref 0.3–1.2)
Total Protein: 8.3 g/dL — ABNORMAL HIGH (ref 6.5–8.1)

## 2019-11-21 LAB — RENAL FUNCTION PANEL
Albumin: 1.7 g/dL — ABNORMAL LOW (ref 3.5–5.0)
Albumin: 1.7 g/dL — ABNORMAL LOW (ref 3.5–5.0)
Anion gap: 6 (ref 5–15)
Anion gap: 8 (ref 5–15)
BUN: 31 mg/dL — ABNORMAL HIGH (ref 8–23)
BUN: 32 mg/dL — ABNORMAL HIGH (ref 8–23)
CO2: 27 mmol/L (ref 22–32)
CO2: 24 mmol/L (ref 22–32)
Calcium: 7.2 mg/dL — ABNORMAL LOW (ref 8.9–10.3)
Calcium: 7.1 mg/dL — ABNORMAL LOW (ref 8.9–10.3)
Chloride: 100 mmol/L (ref 98–111)
Chloride: 98 mmol/L (ref 98–111)
Creatinine, Ser: 1.96 mg/dL — ABNORMAL HIGH (ref 0.61–1.24)
Creatinine, Ser: 2.01 mg/dL — ABNORMAL HIGH (ref 0.61–1.24)
GFR calc Af Amer: 40 mL/min — ABNORMAL LOW (ref >60)
GFR calc Af Amer: 39 mL/min — ABNORMAL LOW (ref >60)
GFR calc non Af Amer: 35 mL/min — ABNORMAL LOW (ref >60)
GFR calc non Af Amer: 34 mL/min — ABNORMAL LOW (ref >60)
Glucose, Bld: 239 mg/dL — ABNORMAL HIGH (ref 70–99)
Glucose, Bld: 217 mg/dL — ABNORMAL HIGH (ref 70–99)
Phosphorus: 1.9 mg/dL — ABNORMAL LOW (ref 2.5–4.6)
Phosphorus: 1.9 mg/dL — ABNORMAL LOW (ref 2.5–4.6)
Potassium: 4.6 mmol/L (ref 3.5–5.1)
Potassium: 4.6 mmol/L (ref 3.5–5.1)
Sodium: 133 mmol/L — ABNORMAL LOW (ref 135–145)
Sodium: 130 mmol/L — ABNORMAL LOW (ref 135–145)

## 2019-11-21 LAB — CBC
HCT: 27.9 % — ABNORMAL LOW (ref 39.0–52.0)
Hemoglobin: 8.4 g/dL — ABNORMAL LOW (ref 13.0–17.0)
MCH: 29.2 pg (ref 26.0–34.0)
MCHC: 30.1 g/dL (ref 30.0–36.0)
MCV: 96.9 fL (ref 80.0–100.0)
Platelets: 233 10*3/uL (ref 150–400)
RBC: 2.88 MIL/uL — ABNORMAL LOW (ref 4.22–5.81)
RDW: 20.3 % — ABNORMAL HIGH (ref 11.5–15.5)
WBC: 17.1 10*3/uL — ABNORMAL HIGH (ref 4.0–10.5)
nRBC: 2.6 % — ABNORMAL HIGH (ref 0.0–0.2)

## 2019-11-21 LAB — COOXEMETRY PANEL
Carboxyhemoglobin: 2 % — ABNORMAL HIGH (ref 0.5–1.5)
Methemoglobin: 1.4 % (ref 0.0–1.5)
O2 Saturation: 60.5 %
Total hemoglobin: 8.6 g/dL — ABNORMAL LOW (ref 12.0–16.0)

## 2019-11-21 LAB — MAGNESIUM: Magnesium: 2.3 mg/dL (ref 1.7–2.4)

## 2019-11-21 LAB — HEPATITIS B SURFACE ANTIBODY, QUANTITATIVE: Hep B S AB Quant (Post): 78.1 m[IU]/mL (ref >9.9)

## 2019-11-21 LAB — BRAIN NATRIURETIC PEPTIDE: B Natriuretic Peptide: 519.5 pg/mL — ABNORMAL HIGH (ref 0.0–100.0)

## 2019-11-21 LAB — GLUCOSE, CAPILLARY
Glucose-Capillary: 174 mg/dL — ABNORMAL HIGH (ref 70–99)
Glucose-Capillary: 161 mg/dL — ABNORMAL HIGH (ref 70–99)
Glucose-Capillary: 232 mg/dL — ABNORMAL HIGH (ref 70–99)
Glucose-Capillary: 198 mg/dL — ABNORMAL HIGH (ref 70–99)
Glucose-Capillary: 146 mg/dL — ABNORMAL HIGH (ref 70–99)
Glucose-Capillary: 116 mg/dL — ABNORMAL HIGH (ref 70–99)
Glucose-Capillary: 219 mg/dL — ABNORMAL HIGH (ref 70–99)

## 2019-11-21 LAB — APTT: aPTT: 41 seconds — ABNORMAL HIGH (ref 24–36)

## 2019-11-21 MED ORDER — PRO-STAT SUGAR FREE PO LIQD
60.0000 mL | Freq: Two times a day (BID) | ORAL | Status: DC
  Administered 2019-11-21 – 2019-12-02 (×20): 60 mL
  Filled 2019-11-21 (×21): qty 60

## 2019-11-21 MED ORDER — PRO-STAT SUGAR FREE PO LIQD: 30.0000 mL | Freq: Four times a day (QID) | ORAL | Status: DC

## 2019-11-21 MED ORDER — INSULIN GLARGINE 100 UNIT/ML ~~LOC~~ SOLN
6.0000 [IU] | Freq: Every day | SUBCUTANEOUS | Status: DC
  Administered 2019-11-22 – 2019-12-09 (×18): 6 [IU] via SUBCUTANEOUS
  Filled 2019-11-21 (×19): qty 0.06

## 2019-11-21 MED ORDER — VITAL 1.5 CAL PO LIQD
1000.0000 mL | ORAL | Status: DC
  Administered 2019-11-22: 1000 mL
  Filled 2019-11-21 (×9): qty 1000

## 2019-11-21 MED ORDER — HEPARIN (PORCINE) 25000 UT/250ML-% IV SOLN
1250.0000 [IU]/h | INTRAVENOUS | Status: DC
  Administered 2019-11-21: 1100 [IU]/h via INTRAVENOUS
  Filled 2019-11-21 (×3): qty 250

## 2019-11-21 NOTE — Progress Notes (Signed)
ANTICOAGULATION CONSULT NOTE - Follow Up Consult  Pharmacy Consult for Heparin Indication: atrial fibrillation  Allergies  Allergen Reactions  . Baclofen Other (See Comments)    Possibly stroke like symptoms  . Iodinated Diagnostic Agents Swelling, Rash and Other (See Comments)    Other Reaction: burning to mouth, swelling of lips  . Lipitor [Atorvastatin] Other (See Comments)    Leg pain  . Metoprolol Other (See Comments)    Headaches     Patient Measurements: Height: 5' 6.5" (168.9 cm) Weight: 91.6 kg (201 lb 15.1 oz) IBW/kg (Calculated) : 64.95 Heparin Dosing Weight: 84 kg  Vital Signs: Temp: 99 F (37.2 C) (04/26 1500) Temp Source: Oral (04/26 1500) BP: 132/51 (04/26 1121) Pulse Rate: 92 (04/26 1500)  Labs: Recent Labs    11/19/19 0342 11/19/19 0352 11/19/19 1544 11/19/19 1635 11/20/19 0353 11/20/19 0353 11/20/19 0626 11/20/19 1522 11/21/19 0410  HGB  --    < > 6.9*   < > 8.1*   < > 8.5*  --  8.4*  HCT  --    < > 22.5*   < > 26.9*  --  25.0*  --  27.9*  PLT  --   --  249  --  213  --   --   --  233  APTT 38*  --   --   --  44*  --   --   --  41*  CREATININE 3.72*   < > 2.85*   < > 2.31*  --   --  2.00* 1.96*  CKTOTAL  --   --   --   --   --   --   --  795*  --    < > = values in this interval not displayed.    Estimated Creatinine Clearance: 40.2 mL/min (A) (by C-G formula based on SCr of 1.96 mg/dL (H)).   Assessment: 52 YOM with re-do CABG and AVR on 4/1 with post-op Afib. The patient had been managed on Apixaban however this was held on 4/23 due to a drop in Hgb. The patient received 2 units of PRBC on 4/24 and Hgb has been low but stable since. Pharmacy consulted to restart anticoagulation for Afib with Heparin.   Of noting, the patient did have a drop in platelets earlier this admission concerning for HIT however has since tested negative. Plts 126 today. Will start Heparin close to the previously known therapeutic rate and monitor aPTTs initially  (last Apix dose 4/23 AM) and will check a HL/aPTT in the AM for correlation.  Goal of Therapy:  Heparin level 0.3-0.5 units/ml aPTT 66-85 seconds Monitor platelets by anticoagulation protocol: Yes   Plan:  - Start Heparin at 1100 units/hr (11 ml/hr) - Daily aPTT/HL - Will continue to monitor for any signs/symptoms of bleeding and will follow up with aPTT level in 6 hours  Thank you for allowing pharmacy to be a part of this patient's care.  Alycia Rossetti, PharmD, BCPS Clinical Pharmacist Clinical phone for 11/21/2019: [phone] 11/21/2019 4:40 PM   **Pharmacist phone directory can now be found on [email or web address] ([email or web address]).  Listed under [place].

## 2019-11-21 NOTE — Evaluation (Signed)
Physical Therapy Re-Evaluation Patient Details Name: Patrick [name] MRN: [id number] DOB: [birth year] Today's Date: 11/21/2019   History of Present Illness  Pt is a 64 yo male s/p re-do CABGx3 with Aortic valve replacement. Pt has been on CRRT but removed 4/11 due to hypotension and VTach with return to CRRT 4/12 pt with ileus. 4/23  While patient undergoing gated CT (to further evaluate pericardium) he had a PEA arrest. CPR done for approximately 3 minutes with ROSC and patient intubated.  PMHx: ESRD MWF, HTN, PNA, T2DM, MI.  Clinical Impression  Patient remains intubated on pressure support this pm. Able to participate minimally in in bed activity due to sedation, but needed re-eval due to decline in function with L LE knee flexion contracture forming as well as increased debilitation from multiple medical issues and bedrest.  He will continue to benefit from skilled PT while in the acute setting to maximize mobility and decrease side effects of prolonged bedrest and hopeful he will be able to tolerate CIR level rehab upon d/c.      Follow Up Recommendations CIR;Supervision/Assistance - 24 hour    Equipment Recommendations  Rolling walker with 5" wheels    Recommendations for Other Services Rehab consult     Precautions / Restrictions Precautions Precautions: Fall;Sternal;Other (comment) Precaution Comments: external pacer, flexiseal, CRRT, cortrak      Mobility  Bed Mobility         Supine to sit: Max assist;+2 for physical assistance;HOB elevated     General bed mobility comments: brought up into sitting with bed in chair position pt with ETT on PS/CPAP 30% FiO2 PEEP 5 and on CRRT wtih flexiseal so in chair position +2 max A for leaning forward initially with repitition able to initiate leaning forward with mod A +2  Transfers                 General transfer comment: NT, not medically ready  Ambulation/Gait                Stairs            Wheelchair  Mobility    Modified Rankin (Stroke Patients Only)       Balance Overall balance assessment: Needs assistance   Sitting balance-Leahy Scale: Zero Sitting balance - Comments: chair position in bed leaning forward with +2 A, too sleepy to maintain                                     Pertinent Vitals/Pain Pain Assessment: Faces Faces Pain Scale: Hurts even more Pain Location: L knee with stretching into extension Pain Descriptors / Indicators: Grimacing;Guarding Pain Intervention(s): Monitored during session;Repositioned    Home Living Family/patient expects to be discharged to:: Private residence Living Arrangements: Spouse/significant other Available Help at Discharge: Family;Available 24 hours/day Type of Home: Apartment Home Access: Stairs to enter Entrance Stairs-Rails: Psychiatric nurse of Steps: 12 Home Layout: One level Home Equipment: Cane - single point      Prior Function Level of Independence: Independent         Comments: limited community ambulator due to LE pain with prolonged walking; drives     Hand Dominance        Extremity/Trunk Assessment   Upper Extremity Assessment Upper Extremity Assessment: Defer to OT evaluation    Lower Extremity Assessment Lower Extremity Assessment: LLE deficits/detail;RLE deficits/detail RLE Deficits / Details: AAROM WFL, strength  grossly 2+/5, note some ulcerations on toes, heels wrapped in mepilex dressing RLE Sensation: history of peripheral neuropathy;decreased light touch LLE Deficits / Details: AAROM L knee extension to about -10 degrees with prolonged stretching; pt keeps in hip abduction, external rotation and knee flexion; hip abductors tight as well.  Strength grossly 2-/5 LLE Sensation: history of peripheral neuropathy;decreased light touch       Communication   Communication: Other (comment)(intubated)  Cognition Arousal/Alertness: Lethargic;Suspect due to  medications Behavior During Therapy: [facility] for tasks assessed/performed Overall Cognitive Status: Difficult to assess                                 General Comments: reaching for ETT tube at times, reminders not to pull, difficult to stay awake RN reports on Fentanyl      General Comments General comments (skin integrity, edema, etc.): Wife in the room initiated education on ROM activities and RN in the room asked to order Prevlon boots and to aide in ROM activities and positioning on L LE for prevention of knee flexion contracture    Exercises General Exercises - Lower Extremity Ankle Circles/Pumps: AAROM;10 reps;Supine Long Arc Quad: AAROM;5 reps;Both;Seated(work on activating L knee extensors pt falling asleep) Heel Slides: PROM;AROM;5 reps;Both;Seated;Supine(with prolonged stretch on the L, AROM on R) Hip ABduction/ADduction: AAROM;5 reps;Supine;Left(work to stretch into hip neutral position as tight hip abductors and external rotators)   Assessment/Plan    PT Assessment Patient needs continued PT services  PT Problem List Decreased range of motion;Decreased strength;Decreased activity tolerance;Decreased mobility;Decreased balance;Decreased knowledge of use of DME;Decreased knowledge of precautions;Pain;Cardiopulmonary status limiting activity       PT Treatment Interventions DME instruction;Therapeutic activities;Balance training;Patient/family education;Therapeutic exercise;Functional mobility training;Gait training    PT Goals (Current goals can be found in the Care Plan section)  Acute Rehab PT Goals Patient Stated Goal: to get leg stretched out PT Goal Formulation: With patient/family Time For Goal Achievement: 12/05/19 Potential to Achieve Goals: Fair    Frequency Min 3X/week   Barriers to discharge        Co-evaluation PT/OT/SLP Co-Evaluation/Treatment: Yes Reason for Co-Treatment: Complexity of the patient's impairments (multi-system  involvement);For patient/therapist safety;Necessary to address cognition/behavior during functional activity PT goals addressed during session: Strengthening/ROM;Mobility/safety with mobility         AM-PAC PT "6 Clicks" Mobility  Outcome Measure Help needed turning from your back to your side while in a flat bed without using bedrails?: Total Help needed moving from lying on your back to sitting on the side of a flat bed without using bedrails?: Total Help needed moving to and from a bed to a chair (including a wheelchair)?: Total Help needed standing up from a chair using your arms (e.g., wheelchair or bedside chair)?: Total Help needed to walk in hospital room?: Total Help needed climbing 3-5 steps with a railing? : Total 6 Click Score: 6    End of Session Equipment Utilized During Treatment: Other (comment)(vent. CRRT) Activity Tolerance: Patient limited by fatigue;Patient limited by lethargy Patient left: in bed;with call bell/phone within reach;with family/visitor present;with nursing/sitter in room   PT Visit Diagnosis: Muscle weakness (generalized) (M62.81);Other abnormalities of gait and mobility (R26.89)    Time: 3818-2993 PT Time Calculation (min) (ACUTE ONLY): 40 min   Charges:   PT Evaluation $PT Re-evaluation: 1 Re-eval PT Treatments $Therapeutic Exercise: 8-22 mins        Magda Kiel, PT Acute Rehabilitation Services [phone]  11/21/2019   Reginia Naas 11/21/2019, 5:37 PM

## 2019-11-21 NOTE — Progress Notes (Signed)
Nutrition Follow-up  DOCUMENTATION CODES:   Not applicable  INTERVENTION:   Tube Feeding via Cortrak:  Vital 1.5 at 50 ml/hr Pro-Stat 30 mL QID Provides 141 g of protein, 2200 kcals and 912 mL of free water  Continue Rena-Vit  Continue Cortrak with TF post extubation   NUTRITION DIAGNOSIS:   Inadequate oral intake related to acute illness as evidenced by NPO status.  Being addressed via TF   GOAL:   Patient will meet greater than or equal to 90% of their needs  Progressing  MONITOR:   PO intake, Diet advancement, Supplement acceptance, Labs, Weight trends, Skin  REASON FOR ASSESSMENT:   Ventilator    ASSESSMENT:   64 yo male admitted with NSTEMI and found to have mutlivessel CAD and underwent CABG on 4/1, ESRD/HD requiring CRRT. PMH includes ESRD on HD s/p renal transplant, CAD s/p CABG, CHF, DM  3/29 Admission 3/30 Cardiac Cath 4/01 CABG with IABP placement(removed 4/3), Intubated 4/02 CRRT initiated 4/04 Extubated 4/05 Cortrak placed,tip in stomach;okay to feed gastric per RD discussion with Dr. Orvan Seen 4/07 CRRT discontinued, ileus, NPO, Cortrak pulled out by pt 4/08 CL diet in AM, FL diet in PM 4/09 iHD with 1.3 L removed 4/10 CRRT re-initiated 4/11 Diet advanced to Dysphagia III 4/13 Diet advanced to Heart Healthy, Carb Modified 4/14 Cortrak placed 4/15 TF off due to abdominal pain 4/23 PEA arrest while supine for CT chest, hyperkalemia with K+>7.5, Intubated  Pt remains on CRRT for at least 24 more hours, possible extubation today  Vital 1.5 at 45 ml/hr with Pro-Stat 5 times daily via Cortrak tube. Pt now NPO, plan to increase TF to beet meet nutritional needs  Monitor for transition off CRRT to iHD, follow electrolytes. Once off CRRT, may need to adjust TF formula  Labs: phosphorus 1.9 (L) Meds: Rena-vit, ss novolog, lantus  Diet Order:   Diet Order            Diet NPO time specified  Diet effective now              EDUCATION  NEEDS:   No education needs have been identified at this time  Skin:  Skin Assessment: Skin Integrity Issues: Skin Integrity Issues:: Stage II, Unstageable DTI: R toe Stage II: sacrum Unstageable: R toe, L toe  Last BM:  4/22  Height:   Ht Readings from Last 1 Encounters:  10/28/19 5' 6.5" (1.689 m)    Weight:   Wt Readings from Last 1 Encounters:  11/21/19 91.6 kg    BMI:  Body mass index is 32.11 kg/m.  Estimated Nutritional Needs:   Kcal:  2300-2700  Protein:  120-140 gm  Fluid:  1000 plus UOP  Kerman Passey MS, RDN, LDN, CNSC RD Pager Number and RD On-Call Pager Number Located in [place]

## 2019-11-21 NOTE — Progress Notes (Addendum)
Patient ID: Patrick [name], male   DOB: 04/24/1956, 64 y.o.   MRN: [id number]     Advanced Heart Failure Rounding Note  PCP-Cardiologist: Dorris Carnes, MD     Patient Profile   64 y/o male w/ h/o CAD s/p remote CABG in 1997 w/ LIMA-LAD, failed kidney transplant, end-stage renal disease on dialysis admitted w/ CP. LHC w/ severe CAD. Echo w/ reduced EF 35-40% and moderate AI. S/p redo CABG x 3 on 4/1 (SVG-LAD, SVG-PDA, SVG-OM) + bioprosthetic AVR. Post operative course c/b by cardiogenic shock, respiratory failure, VT, and protein malnutrition.    Subjective:    Patient had respiratory arrest with PEA while in CT scanner yesterday., had 5 minutes CPR with drugs, achieved ROSC and was intubated.  K noted > 7.    Remains intubated. Awake. Follows commands. Complaining of his face itching.   On CVVHD pulling -150-20.   On NE 14 mcg, VP 0.03. Co-ox 60.5% CVP 4   Objective:   Weight Range: 91.6 kg Body mass index is 32.11 kg/m.   Vital Signs:   Temp:  [97.8 F (36.6 C)-98.8 F (37.1 C)] 98.2 F (36.8 C) (04/26 0400) Pulse Rate:  [90-93] 92 (04/26 0700) Resp:  [11-26] 15 (04/26 0733) BP: (108-120)/(49-52) 120/49 (04/26 0733) SpO2:  [90 %-100 %] 98 % (04/26 0733) Arterial Line BP: (84-137)/(40-63) 117/56 (04/26 0700) FiO2 (%):  [40 %] 40 % (04/26 0733) Weight:  [91.6 kg] 91.6 kg (04/26 0500) Last BM Date: 11/19/19  Weight change: Filed Weights   11/17/19 0402 11/20/19 0500 11/21/19 0500  Weight: 98.7 kg 93.6 kg 91.6 kg    Intake/Output:   Intake/Output Summary (Last 24 hours) at 11/21/2019 0819 Last data filed at 11/21/2019 0800 Gross per 24 hour  Intake 2331.91 ml  Output 5537 ml  Net -3205.09 ml      Physical Exam   CVP 4-5  General:  Awake/intbated.  HEENT: ETT Neck: supple. JVP does not appear elevated. Carotids 2+ bilat; no bruits. No lymphadenopathy or thryomegaly appreciated. Cor: PMI nondisplaced. Regular rate & rhythm. No rubs, gallops or murmurs. Sternal  incision with staples.  Lungs: clear Abdomen: soft, nontender, nondistended. No hepatosplenomegaly. No bruits or masses. Good bowel sounds. Extremities: no cyanosis, clubbing, rash, edema Neuro: Awake on vent following commands.   Telemetry   A paced 90s   Labs    CBC Recent Labs    11/20/19 0353 11/20/19 0353 11/20/19 0626 11/21/19 0410  WBC 13.4*  --   --  17.1*  HGB 8.1*   < > 8.5* 8.4*  HCT 26.9*   < > 25.0* 27.9*  MCV 96.1  --   --  96.9  PLT 213  --   --  233   < > = values in this interval not displayed.   Basic Metabolic Panel Recent Labs    11/20/19 0353 11/20/19 0626 11/20/19 1522 11/21/19 0410  NA 134*   < > 134* 133*  K 4.8   < > 5.0 4.6  CL 102   < > 101 100  CO2 26   < > 27 27  GLUCOSE 251*   < > 199* 239*  BUN 36*   < > 33* 31*  CREATININE 2.31*   < > 2.00* 1.96*  CALCIUM 7.2*   < > 7.2* 7.2*  MG 2.4  --   --  2.3  PHOS 2.4*   < > 2.4* 1.9*   < > = values in this interval not displayed.  Liver Function Tests Recent Labs    11/20/19 0353 11/20/19 0353 11/20/19 1522 11/21/19 0410  AST 251*  --   --  131*  ALT 189*  --   --  156*  ALKPHOS 264*  --   --  324*  BILITOT 1.4*  --   --  1.9*  PROT 8.5*  --   --  8.3*  ALBUMIN 1.7*  1.6*   < > 1.7* 1.7*  1.7*   < > = values in this interval not displayed.   Recent Labs    11/18/19 1101  LIPASE 83*   Cardiac Enzymes Recent Labs    11/20/19 1522  CKTOTAL 795*    BNP: BNP (last 3 results) Recent Labs    11/20/19 0353 11/21/19 0410  BNP 374.6* 519.5*    ProBNP (last 3 results) No results for input(s): PROBNP in the last 8760 hours.   D-Dimer No results for input(s): DDIMER in the last 72 hours. Hemoglobin A1C No results for input(s): HGBA1C in the last 72 hours. Fasting Lipid Panel No results for input(s): CHOL, HDL, LDLCALC, TRIG, CHOLHDL, LDLDIRECT in the last 72 hours. Thyroid Function Tests No results for input(s): TSH, T4TOTAL, T3FREE, THYROIDAB in the last 72  hours.  Invalid input(s): FREET3  Other results:   Imaging    DG CHEST PORT 1 VIEW  Result Date: 11/21/2019 CLINICAL DATA:  Intubation EXAM: PORTABLE CHEST 1 VIEW COMPARISON:  Yesterday FINDINGS: Endotracheal tube with tip halfway between the clavicular heads and carina. The feeding tube at least reaches the stomach. Dialysis catheter on the left with tip at the upper right atrium. Right IJ line with tip at the SVC. Cardiomegaly. CABG and aortic valve replacement. Small pleural effusions with no visible pneumothorax. Retrocardiac aeration is improved. IMPRESSION: 1. Stable hardware positioning. 2. Mildly improved aeration. 3. Small pleural effusions. Electronically Signed   By: Monte Fantasia M.D.   On: 11/21/2019 07:35     Medications:     Scheduled Medications: . amiodarone  200 mg Per Tube BID  . chlorhexidine gluconate (MEDLINE KIT)  15 mL Mouth Rinse BID  . Chlorhexidine Gluconate Cloth  6 each Topical Daily  . clopidogrel  75 mg Per Tube Daily  . darbepoetin (ARANESP) injection - NON-DIALYSIS  60 mcg Subcutaneous Q Sat-1800  . docusate  100 mg Per Tube BID  . feeding supplement (PRO-STAT SUGAR FREE 64)  30 mL Per Tube 5 X Daily  . feeding supplement (VITAL 1.5 CAL)  1,000 mL Per Tube Q24H  . insulin aspart  0-9 Units Subcutaneous Q4H  . mouth rinse  15 mL Mouth Rinse 10 times per day  . midodrine  15 mg Per Tube TID WC  . multivitamin  1 tablet Per Tube QHS  . pantoprazole sodium  40 mg Per Tube Daily  . rosuvastatin  10 mg Per Tube q1800  . sodium chloride flush  10-40 mL Intracatheter Q12H    Infusions: .  prismasol BGK 4/2.5 400 mL/hr at 11/18/19 1410  . sodium chloride 10 mL/hr at 11/21/19 0800  . sodium chloride    . sodium chloride    . fentaNYL infusion INTRAVENOUS 50 mcg/hr (11/21/19 0800)  . norepinephrine (LEVOPHED) Adult infusion 14 mcg/min (11/21/19 0800)  . prismasol BGK 0/2.5 300 mL/hr at 11/20/19 1625  . prismasol BGK 4/2.5 1,800 mL/hr at 11/20/19  1611  . vasopressin (PITRESSIN) infusion - *FOR SHOCK* 0.03 Units/min (11/21/19 0800)    PRN Medications: sodium chloride, Place/Maintain arterial line **  AND** sodium chloride, Place/Maintain arterial line **AND** sodium chloride, acetaminophen (TYLENOL) oral liquid 160 mg/5 mL, alteplase, dextrose, diphenhydrAMINE, fentaNYL, fentaNYL (SUBLIMAZE) injection, Gerhardt's butt cream, heparin, heparin, levalbuterol, lip balm, metoprolol tartrate, midazolam, ondansetron (ZOFRAN) IV, oxyCODONE, sodium chloride, sodium chloride flush, sodium chloride flush   Assessment/Plan   1. Cardiogenic shock: Post-op.  Echo 4/14 with EF 30-35%, stable aortic valve replacement.  Echo on 4/21 with EF 45-50% and significant respirophasic septal variation.  Pericardium in some places appears thickened though windows are poor.  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 CT chest 4/23, difficult images as the patient became unstable during the scan. There did not appear to be significant pericardial pathology. S/p PEA arrest 4/23.  -Now on NE 14, VP 0.03, midodrine 15 tid.   - CO-OX 60.5%  -Will continue CVVHD 150. Volume status much improved. Hopefully can come off.  2. ESRD:  - continue CVVHD as above. Renal following. 3. Possible aspiration PNA: He has completed empiric vanco/meropenem.  4. Atrial fibrillation: Paroxysmal.  Now a-paced at 90.  - Continue amiodarone 200 mg bid for now. - Now off apixaban after arrest. Not sure why he is not on heparin.  5. Anemia, post-op:  - Hgb 6.9 yesterday. Received 1u RBCs. Hgb 8.4  today 6. CAD: Redo CABG x 3 this admission on 4/1 (SVG-LAD, SVG-PDA, SVG-OM).   - No s/s ischemia  - Continue Plavix and Crestor.  7. VT:  Frequent NSVT, now on Amiodarone gtt improvement. - Keep K> 4.0 Mg > 2.0  - Off b-blocker with shock 8 Acute hypoxic respiratory failure: due to pulmonary edema and aspiration PNA - re-intubated after PEA arrest  4/23 - continue to wean vent as tolerated.  - Now that volume status improved hopefully he can extubate. Appreciate CCM.  9. ID: Afebrile. - Cultures NGTD - C diff negative - Sputum GS 4/23. Rare GNR 10. PEA arrest on 4/23 - resuscitated with CPR - suspect due to hypoxia  Length of Stay: [place], NP  11/21/2019, 8:19 AM  Advanced Heart Failure Team Pager [phone] (M-F; 7a - 4p)   Patient seen with NP, agree with the above note.   CVVH ongoing, 150-200 cc/hr net.  CXR improving, still with left effusion.  On NE 14, vasopressin 0.03.  LFTs elevated with RUQ tenderness/pain. No fever.   General: NAD, awake on vent Neck: No JVD, no thyromegaly or thyroid nodule.  Lungs: Decreased at bases.  CV: Nondisplaced PMI.  Heart regular S1/S2, no S3/S4, no murmur.  No peripheral edema.   Abdomen: Soft, nontender, no hepatosplenomegaly, no distention.  Skin: Intact without lesions or rashes.  Neurologic: Awake/alert Extremities: No clubbing or cyanosis.  HEENT: Normal.   RUQ pain with elevated LFTs.  - RUQ [place] ordered to look for cholecystitis.  - Follow CMET daily.   Lung fields improving on CXR, CVP down.  Probably 1 more day CVVH then back to iHD.  Suspect left pleural effusion too small for thoracentesis.  - Continue CVVH today, then to iHD.  - Extubate soon, per CCM.   Continue to wean pressors to midodrine.  Should be easier when we back off on CVVH.   Continue amiodarone for now for atrial fibrillation. Would cover with heparin gtt for now, eventually back to Eliquis.   CRITICAL CARE Performed by: Loralie Champagne  Total critical care time: 35 minutes  Critical care time was exclusive of separately billable procedures and treating other patients.  Critical care was necessary  to treat or prevent imminent or life-threatening deterioration.  Critical care was time spent personally by me on the following activities: development of treatment plan with patient and/or  surrogate as well as nursing, discussions with consultants, evaluation of patient's response to treatment, examination of patient, obtaining history from patient or surrogate, ordering and performing treatments and interventions, ordering and review of laboratory studies, ordering and review of radiographic studies, pulse oximetry and re-evaluation of patient's condition.  Loralie Champagne 11/21/2019 4:04 PM

## 2019-11-21 NOTE — Progress Notes (Signed)
CRRT continuing to pull @ 150-200/hr; levo req. 10-15mcg/min over shift (net negative 1.7L)  Pt drowsy to alert; only c/o pruritis  Endorses RUQ pain with palpation; denies pain at rest  PT and OT consulted as pt MAE (stiff) and follows commands  ---------------------------------------------------------------------  0800 Wife at bedside; reminds pt to not pull at lines/devices. Able to keep mitts off all shift with her help.  0930 TF held per request of [place]  1400 CRRT filter @ 72hr; replaced without complication Bedside US evaluate L effusion; progressing, but not going to extubate today  1600 Working with PT/OT  1700 Bedside US to RUQ  1800 TF restarted Heparin gtt started Daughter visiting

## 2019-11-21 NOTE — Progress Notes (Signed)
Occupational Therapy Re-Evaluation  PTA, pt independent with ADL and mobility and enjoyed watching sports. Limited community ambulator due to LE pain. Pt seen while intubated and on pressure support. Pt on sedation and lethargic, however able to follow commands. Wife present for session and is very supportive. Pt currently Max A for grooming and total A with all other ADL. Max A +2 to mobilize from seated position in bed. Educated nsg on need to use wedge on L side to decrease hip ER and use of blanket roll under L achilles to increase terminal extension.  Anticipate pt will make steady progress and feel he is an excellent candidate for CIR with goals to return home with wife. Will follow acutely.     11/21/19 1735  OT Visit Information  Last OT Received On 11/21/19  Assistance Needed +2  PT/OT/SLP Co-Evaluation/Treatment Yes  Reason for Co-Treatment Complexity of the patient's impairments (multi-system involvement);For patient/therapist safety  OT goals addressed during session ADL's and self-care;Strengthening/ROM  History of Present Illness Pt is a 64 yo male s/p re-do CABGx3 with Aortic valve replacement. Pt has been on CRRT but removed 4/11 due to hypotension and VTach with return to CRRT 4/12 pt with ileus. 4/23  While patient undergoing gated CT (to further evaluate pericardium) he had a PEA arrest. CPR done for approximately 3 minutes with ROSC and patient intubated.  PMHx: ESRD MWF, HTN, PNA, T2DM, MI.  Precautions  Precautions Fall;Sternal;Other (comment) (vent; pacing wires; CVVHD; cortrack)  Restrictions  RUE Partial Weight Bearing Percentage or Pounds Sternal Precautions  Home Living  Family/patient expects to be discharged to: Private residence  Living Arrangements Spouse/significant other  Available Help at Discharge Family;Available 24 hours/day  Type of Home Apartment  Home Access Stairs to enter  Entrance Stairs-Number of Steps 12  Entrance Stairs-Rails Right;Left  Home  Layout One level  Bathroom Shower/Tub Tub/shower unit  Research officer, trade [employer] - single point  Prior Function  Level of Independence Independent  Comments limited community ambulator due to LE pain with prolonged walking; drives  Communication  Communication Other (comment) (intubated)  Pain Assessment  Pain Assessment Faces  Faces Pain Scale 6  Pain Location L knee with stretching into extension  Pain Descriptors / Indicators Grimacing;Guarding  Pain Intervention(s) Limited activity within patient's tolerance;Repositioned  Cognition  Arousal/Alertness Lethargic;Suspect due to medications  Behavior During Therapy Flat affect  Overall Cognitive Status Difficult to assess  General Comments reaching for ETT tube at times, reminders not to pull, difficult to stay awake RN reports on Fentanyl; appeasr to be following commands appropriately; shakin ghead yes/no; difficulty maintaining arousal  Difficult to assess due to Level of arousal;Intubated  Upper Extremity Assessment  Upper Extremity Assessment Generalized weakness  RUE Deficits / Details @ 3+/5 throughout  LUE Deficits / Details @ 3+?5 throughotu  Lower Extremity Assessment  Lower Extremity Assessment Defer to PT evaluation  LLE Deficits / Details L knee flexion contracture  LLE Sensation history of peripheral neuropathy;decreased light touch  Cervical / Trunk Assessment  Cervical / Trunk Assessment Kyphotic;Other exceptions  ADL  Grooming Wash/dry hands;Wash/dry face;Maximal assistance  Upper Body Bathing Maximal assistance;Sitting  Lower Body Bathing Total assistance;Bed level  Upper Body Dressing  Total assistance;Bed level  Lower Body Dressing Total assistance;Bed level  Functional mobility during ADLs  (unable to attempt; will need +2)  Bed Mobility  General bed mobility comments brought up into sitting with bed in chair position pt with ETT on PS/CPAP 30% FiO2  PEEP 5 and on CRRT wtih flexiseal so  in chair position +2 max A for leaning forward initially with repitition able to initiate leaning forward with mod A +2  Transfers  General transfer comment NT, not medically ready  Balance  Sitting balance-Leahy Scale Fair  Sitting balance - Comments Pt pushing self upright to midline in bed spontaneously  General Exercises - Upper Extremity  Shoulder Flexion AAROM;Both;10 reps;Seated  Shoulder ABduction AAROM;Both;10 reps;Seated  Elbow Flexion AAROM;Both;10 reps;Seated  Elbow Extension AAROM;Both;10 reps;Seated  Wrist Flexion AROM;Both;10 reps;Seated  Wrist Extension AROM;Both;10 reps;Seated  Digit Composite Flexion AROM;Both;10 reps;Seated  Composite Extension AROM;Both;10 reps;Seated  OT - End of Session  Equipment Utilized During Treatment Oxygen  Activity Tolerance Patient tolerated treatment well;Patient limited by lethargy  Patient left in bed;with call bell/phone within reach;with bed alarm set;with family/visitor present;with nursing/sitter in room (modified chair position)  Nurse Communication Mobility status;Other (comment) (positioning to decrease hip ER and increase knee extension)  OT Assessment  OT Recommendation/Assessment Patient needs continued OT Services  OT Visit Diagnosis Unsteadiness on feet (R26.81);Muscle weakness (generalized) (M62.81);Pain;Other symptoms and signs involving cognitive function  Pain - Right/Left Left  Pain - part of body Knee  OT Problem List Decreased strength;Decreased range of motion;Decreased activity tolerance;Impaired balance (sitting and/or standing);Decreased coordination;Decreased cognition;Decreased safety awareness;Decreased knowledge of use of DME or AE;Cardiopulmonary status limiting activity;Obesity;Pain  OT Plan  OT Frequency (ACUTE ONLY) Min 2X/week  OT Treatment/Interventions (ACUTE ONLY) Self-care/ADL training;Therapeutic exercise;Neuromuscular education;Energy conservation;DME and/or AE instruction;Therapeutic  activities;Cognitive remediation/compensation;Patient/family education;Balance training  AM-PAC OT "6 Clicks" Daily Activity Outcome Measure (Version 2)  Help from another person eating meals? 1  Help from another person taking care of personal grooming? 2  Help from another person toileting, which includes using toliet, bedpan, or urinal? 1  Help from another person bathing (including washing, rinsing, drying)? 2  Help from another person to put on and taking off regular upper body clothing? 1  Help from another person to put on and taking off regular lower body clothing? 1  6 Click Score 8  OT Recommendation  Recommendations for Other Services Rehab consult  Follow Up Recommendations CIR;Supervision/Assistance - 24 hour  OT Equipment 3 in 1 bedside commode;Other (comment) (will further assess)  Individuals Consulted  Consulted and Agree with Results and Recommendations Patient;Family member/caregiver  Family Member Consulted wife Freda Munro)  Acute Rehab OT Goals  Patient Stated Goal per wife for pt to get well and go home  OT Goal Formulation With patient/family  Time For Goal Achievement 12/05/19  Potential to Achieve Goals Good  OT Time Calculation  OT Start Time (ACUTE ONLY) 1614  OT Stop Time (ACUTE ONLY) 1646  OT Time Calculation (min) 32 min  OT General Charges  $OT Visit 1 Visit  OT Evaluation  $OT Re-eval 1 Re-eval  Written Expression  Dominant Hand Left  Maurie Boettcher, OT/L   Acute OT Clinical Specialist [place] Pager [phone] Office [phone]

## 2019-11-21 NOTE — Progress Notes (Signed)
Patient ID: Patrick [name], male   DOB: 1956/01/26, 64 y.o.   MRN: [id number] [facility] Progress Note   Assessment/ Plan:   1.  Redo coronary artery bypass grafting complicated by cardiogenic shock: Patient with history of coronary artery disease/NSTEMI.  Remains intubated and on pressors and significant net ultrafiltration overnight (net -3.3 L, 2 kg weight loss).  Anticipating possible extubation today. 2. ESRD: Transiently on CRRT for management of azotemia and volume unloading while on pressors.  Anticipate ability to extubate today and then reevaluation for possible transition to intermittent hemodialysis-I would recommend continuing CRRT for another 24 hours for UF to get him much lower than his dry weight (99.5 kg). 3. Anemia: With low hemoglobin and hematocrit, continue on Aranesp with intention for PRBC transfusion when hemoglobin <8. 4. CKD-MBD: Marginally low phosphorus, calcium corrects with albumin.  Ongoing as needed replacement while on CRRT. 5. Nutrition: Enteral nutrition with supplementation per CCM. 6.  Atrial fibrillation: With controlled ventricular response, remains on pressors.  Subjective:   Without acute events overnight, tolerating CRRT without problems.   Objective:   BP (!) 109/49 (BP Location: Right Leg)   Pulse 92   Temp 98.2 F (36.8 C) (Oral)   Resp 17   Ht 5' 6.5" (1.689 m)   Wt 91.6 kg   SpO2 98%   BMI 32.11 kg/m   Physical Exam: Gen: Appears to be comfortable resting in bed, intubated but awake/alert CVS: Pulse regular rhythm, normal rate, S1 and S2 normal.  Intact sternotomy scar/staples Resp: Anteriorly clear to auscultation, no rales/rhonchi Abd: Soft, obese, nontender Ext: No lower extremity edema.  Labs: BMET Recent Labs  Lab 11/16/19 0432 11/17/19 0537 11/18/19 1600 11/18/19 2028 11/19/19 0342 11/19/19 0342 11/19/19 0352 11/19/19 0426 11/19/19 1544 11/20/19 0353 11/20/19 0626 11/20/19 1522 11/21/19 0410  NA  134*   < > 133*  --  136   < > 136 139 134* 134* 139 134* 133*  K 4.5   < > 7.1*   < > 5.5*   < > 5.5* 5.6* 4.8 4.8 4.5 5.0 4.6  CL 103   < > 103  --  104  --  104  --  100 102  --  101 100  CO2 23   < > 20*  --  24  --  23  --  23 26  --  27 27  GLUCOSE 184*   < > 364*  --  222*  --  220*  --  228* 251*  --  199* 239*  BUN 24*   < > 80*  --  54*  --  54*  --  41* 36*  --  33* 31*  CREATININE 2.14*   < > 6.27*  --  3.72*  --  4.00*  --  2.85* 2.31*  --  2.00* 1.96*  CALCIUM 7.0*   < > 7.9*  --  7.5*  --  7.6*  --  7.4* 7.2*  --  7.2* 7.2*  PHOS 1.7*  --  4.5  --   --   --  3.6  --  2.7 2.4*  --  2.4* 1.9*   < > = values in this interval not displayed.   CBC Recent Labs  Lab 11/18/19 1101 11/18/19 1141 11/19/19 1544 11/19/19 1544 11/19/19 1635 11/20/19 0353 11/20/19 0626 11/21/19 0410  WBC 18.0*  --  15.2*  --   --  13.4*  --  17.1*  HGB 7.0*   < >  6.9*   < > 6.8* 8.1* 8.5* 8.4*  HCT 23.7*   < > 22.5*   < > 22.3* 26.9* 25.0* 27.9*  MCV 98.3  --  94.9  --   --  96.1  --  96.9  PLT 219  --  249  --   --  213  --  233   < > = values in this interval not displayed.     Medications:    . amiodarone  200 mg Per Tube BID  . chlorhexidine gluconate (MEDLINE KIT)  15 mL Mouth Rinse BID  . Chlorhexidine Gluconate Cloth  6 each Topical Daily  . clopidogrel  75 mg Per Tube Daily  . darbepoetin (ARANESP) injection - NON-DIALYSIS  60 mcg Subcutaneous Q Sat-1800  . docusate  100 mg Per Tube BID  . feeding supplement (PRO-STAT SUGAR FREE 64)  30 mL Per Tube 5 X Daily  . feeding supplement (VITAL 1.5 CAL)  1,000 mL Per Tube Q24H  . insulin aspart  0-9 Units Subcutaneous Q4H  . mouth rinse  15 mL Mouth Rinse 10 times per day  . midodrine  15 mg Per Tube TID WC  . multivitamin  1 tablet Per Tube QHS  . pantoprazole sodium  40 mg Per Tube Daily  . rosuvastatin  10 mg Per Tube q1800  . sodium chloride flush  10-40 mL Intracatheter Q12H   Elmarie Shiley, MD 11/21/2019, 7:37 AM

## 2019-11-21 NOTE — TOC Initial Note (Signed)
Transition of Care [facility]) - Initial/Assessment Note    Patient Details  Name: Patrick [name] MRN: [id number] Date of Birth: 1955/11/09  [facility] CM/SW Contact:    Curlene Labrum, RN Phone Number: 11/21/2019, 10:56 AM  Clinical Narrative:                 64 y/o male w/ h/o CAD s/p remote CABG in 1997 w/ LIMA-LAD, failed kidney transplant,end-stage renal disease on dialysis admitted w/ CP. LHC w/ severe CAD. Echo w/ reduced EF 35-40% and moderate AI. S/p redo CABG x 3 on 4/1 (SVG-LAD, SVG-PDA, SVG-OM) + bioprosthetic AVR. Post operative course c/b by cardiogenic shock, respiratory failure, VT, and protein malnutrition.  Case Management sent message to [facility] CMA's to obtain medication cost for Eliquis 5 mg by mouth twice daily for 30 day supply.  Will follow up for patient cost.        Patient Goals and CMS Choice        Expected Discharge Plan and Services                                                Prior Living Arrangements/Services                       Activities of Daily Living Home Assistive Devices/Equipment: None ADL Screening (condition at time of admission) Patient's cognitive ability adequate to safely complete daily activities?: Yes Is the patient deaf or have difficulty hearing?: No Does the patient have difficulty seeing, even when wearing glasses/contacts?: No Does the patient have difficulty concentrating, remembering, or making decisions?: No Patient able to express need for assistance with ADLs?: Yes Does the patient have difficulty dressing or bathing?: No Independently performs ADLs?: Yes (appropriate for developmental age) Does the patient have difficulty walking or climbing stairs?: Yes Weakness of Legs: None Weakness of Arms/Hands: None  Permission Sought/Granted                  Emotional Assessment              Admission diagnosis:  NSTEMI (non-ST elevated myocardial infarction) ([place])  [I21.4] Symptomatic anemia [D64.9] S/P CABG x 3 [Z95.1] Patient Active Problem List   Diagnosis Date Noted  . Cardiac arrest ([place])   . Pressure injury of skin 11/10/2019  . S/P CABG x 3 10/27/2019  . NSTEMI (non-ST elevated myocardial infarction) ([place]) 10/24/2019  . History of renal transplant 09/06/2019  . Vitamin D deficiency 09/06/2019  . Encounter for immunization 04/20/2019  . Unstable angina ([place])   . ACS (acute coronary syndrome) ([place]) 02/14/2019  . Iron deficiency anemia, unspecified 01/10/2019  . Coagulation defect, unspecified ([place]) 11/01/2018  . Complication of vascular dialysis catheter 11/01/2018  . Diarrhea, unspecified 11/01/2018  . Pain, unspecified 11/01/2018  . Pruritus, unspecified 11/01/2018  . Acute on chronic diastolic (congestive) heart failure ([place]) 10/27/2018  . SOB (shortness of breath) 10/02/2018  . Peritoneal abscess ([place]) 10/01/2018  . Nausea and vomiting 08/20/2018  . Chronic anemia 05/12/2018  . Hypokalemia 05/12/2018  . Acute combined systolic and diastolic congestive heart failure ([place]) 05/12/2018  . Hyperlipidemia 05/12/2018  . Physical deconditioning 05/12/2018  . Multifocal pneumonia 05/11/2018  . Allergy to IVP dye, subsequent encounter   . CAD (coronary artery disease) 11/27/2017  . Atherosclerosis of native arteries of  extremity with intermittent claudication ([place]) 04/03/2017  . PAD (peripheral artery disease) ([place]) 02/02/2017  . Typical atrial flutter ([place]) 09/24/2016  . PAF (paroxysmal atrial fibrillation) ([place]) 05/25/2016  . ESRD (end stage renal disease) ([place]) 05/11/2016  . Anemia of chronic disease   . End-stage renal disease on hemodialysis ([place])   . Renovascular hypertension   . History of MI (myocardial infarction)   . Acute respiratory failure with hypoxia ([place])   . Controlled diabetes mellitus type 2 with complications ([place])   . Pleural effusion   . Symptomatic anemia 01/22/2016  . Acute respiratory failure ([place]) 01/22/2016   . Acute on chronic diastolic CHF (congestive heart failure) ([place]) 01/22/2016  . Elevated troponin 01/22/2016  . Febrile illness 01/22/2016  . Activity of daily living alteration 12/06/2015  . Difficulty walking 11/29/2015  . Chest pain 11/17/2015  . Normocytic anemia 11/17/2015  . Gynecomastia 11/17/2015  . Renal stone 11/17/2015  . Cough 11/17/2015  . Dyspnea 10/30/2015  . CAP (community acquired pneumonia)   . HCAP (healthcare-associated pneumonia) 10/11/2015  . Acute respiratory disease 10/11/2015  . Protein-calorie malnutrition, severe ([place]) 09/16/2014  . 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 Metabolic acidosis, NAG, failure of bicarbonate regeneration 09/14/2014  . Heart murmur 02/24/2014  . Anemia in chronic kidney disease 11/24/2013  . 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 Hypocalcemia 11/24/2013  . Low bicarbonate 11/24/2013  . CKD (chronic kidney disease) stage 5, GFR less than 15 ml/min (HCC) 10/12/2013  . Acute kidney injury ([place]) 09/29/2013  . Immunosuppressed status ([place]) 07/07/2013  . Essential hypertension 03/19/2013  . Acute gout 03/19/2013  . Impotence of organic origin 03/19/2013  . Secondary hyperparathyroidism ([place]) 03/19/2013  . End-stage renal failure with renal transplant ([place]) 03/19/2013  . Gout 03/26/2012  . 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

## 2019-11-21 NOTE — Progress Notes (Signed)
Inpatient Rehabilitation Admissions Coordinator  Noted events since Friday. I continue to follow his progress.  Danne Baxter, RN, MSN Rehab Admissions Coordinator [phone] 11/21/2019 4:19 PM

## 2019-11-21 NOTE — Progress Notes (Signed)
NAME:  Patrick [name], MRN:  [id number], DOB:  [birth year], LOS: 47 ADMISSION DATE:  10/24/2019, CONSULTATION DATE:  11/17/2020 REFERRING MD:  Kirk Ruths, CHIEF COMPLAINT:  Respiratory Arrest 4/23   Brief History   64 y/o male w/ h/o CAD, HTN, HLD, DM, ESRD.CABG x 1 1997, failed kidney transplant,end-stage renal disease on dialysis admitted 3/29  w/ CP, NSTEMI . He underwent  re-do CABG x 3  on 4/1. Post operative course c/b by cardiogenic shock, respiratory failure, VT,illius, and protein malnutrition. Pt. Had respiratory /  PEA arrest in CT on 4/23 requiring intubation. PCCM have been asked to consult  for respiratory failure/ ventilation management post arrest.  Past Medical History  CAD, HTN, HLD, DM, ESRD.  NSTEMI 10/25/2019 10/25/2019: LHC w/ severe CAD. Echo w/ reduced EF 35-40% and moderate AI. CABG Re-Do x 3 on 10/27/2019(SVG-LAD, SVG-PDA, SVG-OM).    RCA PCI 01/2019 Atrial Flutter s/p ablation 2018 CABG 1997 w / LIMA-LAD Combined systolic/ Diastolic Heart Failure Failed kidney transplant,end-stage renal disease on dialysis   Significant Hospital Events   3/29 Admission 4/1 CABG x 3 ( Re-do) 4/23 Respiratory / PEA  Arrest  4/23 Intubation and PCCM consult  Consults:  3/29 Renal Consult 3/30 TCTS Consult 4/23 PCCM  Procedures:  4/1 CABG x 3 (SVG-LAD, SVG-PDA, SVG-OM).  Significant Diagnostic Tests:  11/19/2019 [facility]A Cardiology  IMPRESSION: 1. Moderate to large bilateral pleural effusions are identified. The left pleural effusion appears partially loculated with loculated fluid along the paramediastinal left upper lobe. 2. There is compressive type atelectasis and consolidation associated with bilateral pleural effusions with considerable reduction in aerated lung volumes bilaterally. 3. Aortic atherosclerosis. IMPRESSION: Difficult images. There does not appear to be significant pericardial pathology.   11/18/2019 CXR Intubated, endotracheal tube tip in good position.  Otherwise stable lines and tubes. Improved lung volumes since 0907 hours today.  Small bilateral pleural effusions with compressive lung base atelectasis better demonstrated on the CT images today 1045 hours.  Echo 4/14 with EF 30-35%, stable aortic valve replacement.    Echo 4/21 with EF 45-50% and significant respirophasic septal variation.  Pericardium in some places appears thickened though windows are poor.  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  Cath 10/25/2019 . Severe 3 vessel CAD    - CTO of the mid LAD after the first diagonal    - 90% ostial LCx    - 100% mid RCA in stent. Left to right collaterals. 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 Atretic LIMA graft Mildly elevated LVEDP Early restenosis/occlusion of the RCA. This vessel is diffusely diseased and heavily calcified. The ostial LCx appears new compared to prior.  Percutaneous options are very limited Micro Data:  4/14 Blood>> No growth 4/11 Blood >> No Growth 4/8  Respiratory>> Normal Flora 4/8/ Blood >> No Growth 3/29 SARS Coronavirus 2 >> Negative Antimicrobials:  Vanc 4/11-4/17 Meropenem 4/11-4/17  Interim history/subjective:  No acute events overnight. Minimal increase in pressor after increasing UF on CRRT. Tolerating pressure support this AM. Intubated but able to answer yes/no questions and no complaints.   Objective   Blood pressure (!) 109/49, pulse 92, temperature 98.2 F (36.8 C), temperature source Oral, resp. rate (!) 22, height 5' 6.5" (1.689 m), weight 93.6 kg, SpO2 98 %. CVP:  [1 mmHg-17 mmHg] 10 mmHg  Vent Mode: PRVC FiO2 (%):  [40 %] 40 % Set Rate:  [22 bmp] 22 bmp Vt Set:  [510 mL] 510 mL PEEP:  [  5 cmH20] 5 cmH20 Pressure Support:  [8 cmH20] 8 cmH20 Plateau Pressure:  [18 cmH20-26 cmH20] 26 cmH20   Intake/Output Summary (Last 24 hours) at 11/21/2019 1937 Last data filed at 11/21/2019 0600 Gross per 24 hour  Intake  2279.46 ml  Output 5359 ml  Net -3079.54 ml   Filed Weights   11/16/19 0400 11/17/19 0402 11/20/19 0500  Weight: 96.6 kg 98.7 kg 93.6 kg    Examination: General: sleeping in bed in no acute distress  HENT: ETT in place, thick secretions from subglottic tub e Lungs: coarse breath sounds with ronchi bilaterally  Cardiovascular: RRR Abdomen: soft, NTND Extremities: warm, mild pitting edema bilaterally  Neuro: answers questions appropriately and follows commands, moving all 4 extremities  Skin: warm   Resolved Hospital Problem list   Diarrhea   Assessment & Plan:  Acute Hypoxic Respiratory Failure / PEA Arrest while in CT 4/23 - appears to be multifactorial from pulmonary edema, bilateral pleural effusions (possibly loculated), suspected aspiration, undiagnosed OSA/OHS, and poor respiratory mechanics.   Plan -Passed SBT yesterday and CXR improved this morning  -He is net -3L yesterday, CVP 11-15 -Plan to continue volume removal today, may be ready for extubation tomorrow  -VAP protocol in place  -Will continue to monitor effusions for change>> may need thora/ CT  if not responsive to fluid removal ( CVVH at present) -Follow Sputum Cx - reincubated for better growth  -Fentanyl gtt for sedation  -Will need BiPAP at HS once extubated -Will need OP follow up to assess for OSA at discharge  Elevated LFTs- AST and ALT decreasing but T bili up and Alk Phos trending up, unclear etiology  -On rosuvastatin and amiodarone  -GGT normal, CK 795  -Follow up RUQ US  -Hepatic function panel tomorrow   Post-arrest leukocytosis - Initially trending down, now 17. Has remains afebrile  -Will continue to monitor off antibiotics for now -Trend WBC and fever curve -Culture Sputum 4/23- few GPC in clusters and rare GNR    ESRD with post-arrest hyperkalemia  -Nephrology following and managing CRRT  -Continue CVVHD with goal -1-2 L daily; -2.8L yesterday  -Monitor lytes  -Trend BMET  daily  Hypotension post arrest Cardiogenic shock -On Levophed and vaso for MAP goal of > 65 mm Hg -CVP monitoring -Rest per Primary Team  AoC anemia of renal disease - stable  -Trend CBC -Monitor for bleeding -Transfuse for HGB < 8 ( Recent CABG)  Best practice:  Diet: TF  Pain/Anxiety/Delirium protocol (if indicated): Fentanyl with prn Versed VAP protocol (if indicated): Initiated DVT prophylaxis: Eliquis GI prophylaxis: Protonix Glucose control: CBG with SSI Mobility: BR Code Status: Full Family Communication: per primary team  Disposition: ICU  Labs   CBC: Recent Labs  Lab 11/17/19 1611 11/17/19 2108 11/18/19 1101 11/18/19 1141 11/19/19 1544 11/19/19 1635 11/20/19 0353 11/20/19 0626 11/21/19 0410  WBC 10.8*  --  18.0*  --  15.2*  --  13.4*  --  17.1*  HGB 7.3*   < > 7.0*   < > 6.9* 6.8* 8.1* 8.5* 8.4*  HCT 24.1*   < > 23.7*   < > 22.5* 22.3* 26.9* 25.0* 27.9*  MCV 94.9  --  98.3  --  94.9  --  96.1  --  96.9  PLT 175  --  219  --  249  --  213  --  233   < > = values in this interval not displayed.    Basic Metabolic Panel: Recent Labs  Lab 11/16/19 0432 11/17/19 0537 11/18/19 1600 11/18/19 2028 11/19/19 0342 11/19/19 0342 11/19/19 0352 11/19/19 0426 11/19/19 1544 11/20/19 0353 11/20/19 0626 11/20/19 1522 11/21/19 0410  NA 134*   < > 133*  --  136   < > 136   < > 134* 134* 139 134* 133*  K 4.5   < > 7.1*   < > 5.5*   < > 5.5*   < > 4.8 4.8 4.5 5.0 4.6  CL 103   < > 103  --  104   < > 104  --  100 102  --  101 100  CO2 23   < > 20*  --  24   < > 23  --  23 26  --  27 27  GLUCOSE 184*   < > 364*  --  222*   < > 220*  --  228* 251*  --  199* 239*  BUN 24*   < > 80*  --  54*   < > 54*  --  41* 36*  --  33* 31*  CREATININE 2.14*   < > 6.27*  --  3.72*   < > 4.00*  --  2.85* 2.31*  --  2.00* 1.96*  CALCIUM 7.0*   < > 7.9*  --  7.5*   < > 7.6*  --  7.4* 7.2*  --  7.2* 7.2*  MG 2.3  --  2.8*  --  2.6*  --   --   --   --  2.4  --   --  2.3  PHOS 1.7*   --  4.5  --   --   --  3.6  --  2.7 2.4*  --  2.4* 1.9*   < > = values in this interval not displayed.   GFR: Estimated Creatinine Clearance: 40.6 mL/min (A) (by C-G formula based on SCr of 1.96 mg/dL (H)). Recent Labs  Lab 11/18/19 1101 11/18/19 1323 11/18/19 1611 11/19/19 0342 11/19/19 1544 11/20/19 0353 11/21/19 0410  WBC 18.0*  --   --   --  15.2* 13.4* 17.1*  LATICACIDVEN  --  2.0* 1.7 1.1  --   --   --     Liver Function Tests: Recent Labs  Lab 11/18/19 1101 11/18/19 1600 11/19/19 0342 11/19/19 0342 11/19/19 0352 11/19/19 1544 11/20/19 0353 11/20/19 1522 11/21/19 0410  AST 113*  --  86*  --   --   --  251*  --  131*  ALT 104*  --  101*  --   --   --  189*  --  156*  ALKPHOS 190*  --  198*  --   --   --  264*  --  324*  BILITOT 1.3*  --  1.6*  --   --   --  1.4*  --  1.9*  PROT 7.5  --  8.7*  --   --   --  8.5*  --  8.3*  ALBUMIN 1.5*   < > 1.7*   < > 1.7* 1.6* 1.7*   1.6* 1.7* 1.7*   1.7*   < > = values in this interval not displayed.   Recent Labs  Lab 11/18/19 1101  LIPASE 83*   No results for input(s): AMMONIA in the last 168 hours.  ABG    Component Value Date/Time   PHART 7.375 11/20/2019 0626   PCO2ART 39.8 11/20/2019 0626   PO2ART 103 11/20/2019 0626   HCO3  23.4 11/20/2019 0626   TCO2 25 11/20/2019 0626   ACIDBASEDEF 2.0 11/20/2019 0626   O2SAT 60.5 11/21/2019 0410     Coagulation Profile: Recent Labs  Lab 11/18/19 1101  INR 2.6*    Cardiac Enzymes: Recent Labs  Lab 11/20/19 1522  CKTOTAL 795*    HbA1C: Hemoglobin A1C  Date/Time Value Ref Range Status  06/07/2019 10:54 AM 6.4 (A) 4.0 - 5.6 % Final   Hgb A1c MFr Bld  Date/Time Value Ref Range Status  10/27/2019 04:51 AM 6.2 (H) 4.8 - 5.6 % Final    Comment:    (NOTE) Pre diabetes:          5.7%-6.4% Diabetes:              >6.4% Glycemic control for   <7.0% adults with diabetes   08/20/2018 07:23 AM 6.9 (H) 4.8 - 5.6 % Final    Comment:    (NOTE) Pre diabetes:           5.7%-6.4% Diabetes:              >6.4% Glycemic control for   <7.0% adults with diabetes     CBG: Recent Labs  Lab 11/20/19 1110 11/20/19 1514 11/20/19 1948 11/20/19 2321 11/21/19 0422  GLUCAP 167* 165* 174* 161* 232*    Review of Systems:   Patient intubated and unable to verbalize complaints.   Past Medical History  He,  has a past medical history of Arthritis, Atrial flutter ([place]), Coronary artery disease, Erectile dysfunction, ESRD (end stage renal disease) on dialysis [facility]), Essential hypertension, Gout, Hernia of abdominal wall, History of blood transfusion, History of cardiomyopathy, History of kidney stones, History of pneumonia, Migraine, Myocardial infarction ([place]), Pneumonia, Secondary hyperparathyroidism ([place]), Type 2 diabetes mellitus ([place]), and Wears glasses.   Surgical History    Past Surgical History:  Procedure Laterality Date   A-FLUTTER ABLATION N/A 09/24/2016   Procedure: A-Flutter Ablation;  Surgeon: Will Meredith Leeds, MD;  Location: [place] [facility];  Service: Cardiovascular;  Laterality: N/A;   ABDOMINAL AORTOGRAM W/LOWER EXTREMITY N/A 04/13/2017   Procedure: ABDOMINAL AORTOGRAM W/LOWER EXTREMITY;  Surgeon: Conrad Hudson, MD;  Location: [place] [facility];  Service: Cardiovascular;  Laterality: N/A;  Bilater lower extermity   AORTIC VALVE REPLACEMENT N/A 10/27/2019   Procedure: AORTIC VALVE REPLACEMENT (AVR) USING EDWARDS INTUITY 23 MM AORTIC VALVE.;  Surgeon: Wonda Olds, MD;  Location: [place];  Service: Open Heart Surgery;  Laterality: N/A;   APPENDECTOMY     AV FISTULA PLACEMENT Right 09/18/2014   Procedure: INSERTION OF ARTERIOVENOUS (AV) GORE-TEX GRAFT ARM USING 4-7MM  X 45CM STRETCH GORE-TEX VASCULAR GRAFT;  Surgeon: Rosetta Posner, MD;  Location: [place];  Service: Vascular;  Laterality: Right;   AV FISTULA PLACEMENT Left 07/07/2016   Procedure: INSERTION OF LEFT BRACHIAL TO AXILLARY ARTERIOVENOUS (AV) GORE-TEX ARM GRAFT;  Surgeon: Conrad [place], MD;  Location: [place];  Service: Vascular;  Laterality: Left;   AV FISTULA PLACEMENT Left 12/09/2018   Procedure: Insertion Of Arteriovenous (Av) Gore-Tex Graft Arm, left arm;  Surgeon: Marty Heck, MD;  Location: [place];  Service: Vascular;  Laterality: Left;   CARDIAC CATHETERIZATION  ~ 2016   COLONOSCOPY     CORONARY ARTERY BYPASS GRAFT  1997   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./notes 09/04/2005   CORONARY ARTERY BYPASS GRAFT N/A 10/27/2019   Procedure: REDO CORONARY ARTERY BYPASS GRAFTING TIMES 3  USING LEFT GREATER SAPHENOUS  LEG VEIN HARVESTED ENDOSCOPICALLY(CABG);  Surgeon: Wonda Olds, MD;  Location: [place];  Service: Open Heart Surgery;  Laterality: N/A;   CORONARY ATHERECTOMY N/A 02/16/2019   Procedure: CORONARY ATHERECTOMY;  Surgeon: Martinique, Peter M, MD;  Location: [place] [facility];  Service: Cardiovascular;  Laterality: N/A;   ENDOVEIN HARVEST OF GREATER SAPHENOUS VEIN Left 10/27/2019   Procedure: [place] Of Greater Saphenous Vein;  Surgeon: Wonda Olds, MD;  [facility];  Service: Open Heart Surgery;  Laterality: Left;   EXCHANGE OF A DIALYSIS CATHETER Left 01/11/2018   Procedure: EXCHANGE OF TUNNELED DIALYSIS CATHETER;  Surgeon: Rosetta Posner, MD;  Location: [place];  Service: Vascular;  Laterality: Left;   HERNIA REPAIR  2017   with nephrectomy   INSERTION OF DIALYSIS CATHETER N/A 10/08/2017   Procedure: INSERTION OF TUNNELED DIALYSIS CATHETER;  Surgeon: Conrad Guyton, MD;  Location: [place];  Service: Vascular;  Laterality: N/A;   IR FLUORO GUIDE CV LINE LEFT  03/12/2018   IR FLUORO GUIDE CV LINE RIGHT  10/31/2019   IR GENERIC HISTORICAL  05/11/2016   IR FLUORO GUIDE CV LINE LEFT 05/11/2016 Corrie Mckusick, DO [facility]   IR GENERIC HISTORICAL  05/11/2016   IR US GUIDE VASC ACCESS LEFT 05/11/2016 Corrie Mckusick, DO [facility]   IR GENERIC HISTORICAL  05/11/2016   IR US GUIDE VASC ACCESS RIGHT 05/11/2016 Corrie Mckusick, DO [facility]   IR GENERIC HISTORICAL  05/11/2016   IR RADIOLOGY PERIPHERAL GUIDED IV START 05/11/2016 Corrie Mckusick, DO [facility]   IR PTA VENOUS EXCEPT DIALYSIS CIRCUIT  10/31/2019   IR US GUIDE VASC ACCESS RIGHT  10/31/2019   KIDNEY TRANSPLANT  2009   LEFT HEART CATH AND CORONARY ANGIOGRAPHY N/A 02/15/2019   Procedure: LEFT HEART CATH AND CORONARY ANGIOGRAPHY;  Surgeon: Martinique, Peter M, MD;  Location: [place] [facility];  Service: Cardiovascular;  Laterality: N/A;   LEFT HEART CATH AND CORS/GRAFTS ANGIOGRAPHY N/A 12/04/2017   Procedure: LEFT HEART CATH AND CORS/GRAFTS ANGIOGRAPHY;  Surgeon: Belva Crome, MD;  Location: [place] [facility];  Service: Cardiovascular;  Laterality: N/A;   LEFT HEART CATH AND CORS/GRAFTS ANGIOGRAPHY N/A 10/25/2019   Procedure: LEFT HEART CATH AND CORS/GRAFTS ANGIOGRAPHY;  Surgeon: Martinique, Peter M, MD;  Location: [place] [facility];  Service: Cardiovascular;  Laterality: N/A;   NEPHRECTOMY  2017   transplant rejected    PERIPHERAL VASCULAR CATHETERIZATION N/A 06/04/2016   Procedure: Upper Extremity Venography;  Surgeon: Waynetta Sandy, MD;  Location: [place] [facility];  Service: Cardiovascular;  Laterality: N/A;   PERIPHERAL VASCULAR INTERVENTION  04/13/2017   Procedure: PERIPHERAL VASCULAR INTERVENTION;  Surgeon: Conrad [place], MD;  Location: [place] [facility];  Service: Cardiovascular;;  Lt. Common/Exernal  Iliac   TEE WITHOUT CARDIOVERSION N/A 10/27/2019   Procedure: TRANSESOPHAGEAL ECHOCARDIOGRAM (TEE);  Surgeon: Wonda Olds, MD;  Location: [place];  Service: Open Heart Surgery;  Laterality: N/A;   THROMBECTOMY AND REVISION OF ARTERIOVENTOUS (AV) GORETEX  GRAFT Left 10/08/2017   Procedure: THROMBECTOMY of ARTERIOVENTOUS (AV) GORETEX  GRAFT LEFT UPPER ARM;  Surgeon: Conrad , MD;  Location: [place];  Service: Vascular;  Laterality: Left;   THROMBECTOMY W/ EMBOLECTOMY Left 09/14/2017   Procedure: THROMBECTOMY ARTERIOVENOUS  GORE-TEX GRAFT LEFT UPPER ARM;  Surgeon: Rosetta Posner, MD;  Location: [place];  Service: Vascular;  Laterality: Left;   UPPER EXTREMITY ANGIOGRAPHY Bilateral 10/29/2018   Procedure: UPPER EXTREMITY ANGIOGRAPHY;  Surgeon: Marty Heck, MD;  Location: [place] [facility];  Service: Cardiovascular;  Laterality: Bilateral;   UPPER EXTREMITY VENOGRAPHY N/A 11/16/2017   Procedure: UPPER EXTREMITY VENOGRAPHY - Right Arm;  Surgeon: Conrad Section, MD;  Location: [place] [facility];  Service: Cardiovascular;  Laterality: N/A;   UPPER EXTREMITY VENOGRAPHY Bilateral 10/29/2018   Procedure: UPPER EXTREMITY VENOGRAPHY;  Surgeon: Marty Heck, MD;  Location: [place] [facility];  Service: Cardiovascular;  Laterality: Bilateral;     Social History   reports that he quit smoking about 5 years ago. His smoking use included cigarettes. He has never used smokeless tobacco. He reports current alcohol use. He reports that he does not use drugs.   Family History   His family history includes HIV in his sister; Hyperlipidemia in his mother; Hypertension in his mother.   Allergies Allergies  Allergen Reactions   Baclofen Other (See Comments)    Possibly stroke like symptoms   Iodinated Diagnostic Agents Swelling, Rash and Other (See Comments)    Other Reaction: burning to mouth, swelling of lips   Lipitor [Atorvastatin] Other (See Comments)    Leg pain   Metoprolol Other (See Comments)    Headaches      Home Medications  Prior to Admission medications   Medication Sig Start Date End Date Taking? Authorizing Provider  acetaminophen (TYLENOL) 500 MG tablet Take 1,000 mg by mouth every 6 (six) hours as needed for moderate pain or headache.   Yes [provider]  albuterol (PROVENTIL HFA;VENTOLIN HFA) 108 (90 Base) MCG/ACT inhaler Inhale 1-2 puffs into the lungs every 6 (six) hours as needed for wheezing or shortness of breath. 12/18/17  Yes Richardson Dopp T, PA-C  aspirin EC 81 MG tablet  Take 81 mg by mouth daily.    Yes [provider]  calcium acetate (PHOSLO) 667 MG capsule Take 1,334-2,001 mg by mouth See admin instructions. 3 capsules with dinner and 2 capsules with snacks   Yes [provider]  calcium carbonate (OS-CAL - DOSED IN MG OF ELEMENTAL CALCIUM) 1250 (500 Ca) MG tablet Take 2 tablets by mouth daily.  11/29/13  Yes [provider]  clopidogrel (PLAVIX) 75 MG tablet TAKE 1 TABLET(75 MG) BY MOUTH DAILY Patient taking differently: Take 75 mg by mouth daily.  03/03/19  Yes Richardson Dopp T, PA-C  Colchicine 0.6 MG CAPS Take 0.6 mg by mouth See admin instructions. Take 0.6 mg when first symptoms of gout flare occur, then take another 0.6 mg 14 days later as needed for gout 10/13/18  Yes [provider]  dextromethorphan (DELSYM) 30 MG/5ML liquid Take 30 mg by mouth as needed for cough.   Yes [provider]  Dextromethorphan-guaiFENesin 5-100 MG/5ML LIQD Take 15 mLs by mouth 2 (two) times daily as needed (Cough).  06/03/18  Yes [provider]  docusate sodium (COLACE) 100 MG capsule Take 100 mg by mouth daily as needed for mild constipation.   Yes [provider]  ezetimibe (ZETIA) 10 MG tablet TAKE 1 TABLET(10 MG) BY MOUTH DAILY Patient taking differently: Take 10 mg by mouth daily.  05/09/19  Yes Weaver, Scott T, PA-C  isosorbide mononitrate (IMDUR) 30 MG 24 hr tablet Take 1 tablet (30 mg total) by mouth daily. 02/24/19  Yes Daune Perch, NP  lidocaine-prilocaine (EMLA) cream Apply 1 application topically daily as needed (Numbing).  03/21/19  Yes [provider]  metoprolol succinate (TOPROL XL) 25 MG 24 hr tablet Take 1 tablet (  25 mg total) by mouth daily. 05/05/19  Yes Daune Perch, NP  multivitamin (RENA-VIT) TABS tablet Take 1 tablet by mouth daily.    Yes [provider]  neomycin-bacitracin-polymyxin (NEOSPORIN) ointment Apply 1 application topically 2 (two) times daily. Apply to feet.    Yes [provider]  nitroGLYCERIN (NITROSTAT) 0.4 MG SL tablet Place 1 tablet (0.4 mg total) under the tongue every 5 (five) minutes as needed for chest pain. 09/16/19 12/15/19 Yes Fay Records, MD  rosuvastatin (CRESTOR) 10 MG tablet Take 1 tablet (10 mg total) by mouth daily. 02/24/19  Yes Daune Perch, NP  silver sulfADIAZINE (SILVADENE) 1 % cream Apply 1 application topically daily. Patient taking differently: Apply 1 application topically 2 (two) times daily.  10/18/19  Yes Trula Slade, DPM  traMADol (ULTRAM) 50 MG tablet Take 50 mg by mouth 2 (two) times daily as needed for moderate pain.  05/19/18  Yes [provider]  doxycycline (VIBRA-TABS) 100 MG tablet Take 1 tablet (100 mg total) by mouth 2 (two) times daily. Patient not taking: Reported on 10/24/2019 09/06/19   Trula Slade, DPM  Methoxy PEG-Epoetin Beta (MIRCERA IJ) Mircera 09/05/19 09/03/20  [provider]  oxyCODONE-acetaminophen (PERCOCET/ROXICET) 5-325 MG tablet Take 1 tablet by mouth every 6 (six) hours as needed. Patient not taking: Reported on 10/24/2019 12/09/18   Ulyses Amor, PA-C  pantoprazole (PROTONIX) 40 MG tablet Take 1 tablet (40 mg total) by mouth daily. 11/17/19   Fay Records, MD  repaglinide (PRANDIN) 0.5 MG tablet Take 1 tablet (0.5 mg total) by mouth 2 (two) times daily before a meal. OVERDUE FOR AN APPT. WILL PROVIDE 30 DAY SUPPLY ONLY. MUST CALL OFFICE 11/03/19   Renato Shin, MD

## 2019-11-21 NOTE — TOC Benefit Eligibility Note (Signed)
Transition of Care [facility]) Benefit Eligibility Note    Patient Details  Name: Patrick [name] MRN: [id number] Date of Birth: [birth year]   Medication/Dose: Arne Cleveland  5 MG BID  Covered?: Yes  Tier: 3 Drug  Prescription Coverage Preferred Pharmacy: [facility] with Person/Company/Phone Number:: [email or web address]  @ [address] [phone]  Co-Pay: $9.20  Prior Approval: No  Deductible: (NO DEDUCTIBLE WITH PLAN  /   LOW INCOME SUBSIDY)  Additional Notes: SECONDARY INS : MEDICAID [place] ACCESS  EFF-DATE: 03-29-2019, CO-PAY-$ 4.00 FOR EACH PRESCRIPTION    Memory Argue Phone Number: 11/21/2019, 12:01 PM

## 2019-11-22 ENCOUNTER — Inpatient Hospital Stay (HOSPITAL_COMMUNITY): Payer: Medicare HMO

## 2019-11-22 DIAGNOSIS — D72829 Elevated white blood cell count, unspecified: Secondary | ICD-10-CM

## 2019-11-22 DIAGNOSIS — N186 End stage renal disease: Secondary | ICD-10-CM | POA: Diagnosis not present

## 2019-11-22 DIAGNOSIS — Z9911 Dependence on respirator [ventilator] status: Secondary | ICD-10-CM | POA: Diagnosis not present

## 2019-11-22 DIAGNOSIS — I214 Non-ST elevation (NSTEMI) myocardial infarction: Secondary | ICD-10-CM | POA: Diagnosis not present

## 2019-11-22 DIAGNOSIS — I469 Cardiac arrest, cause unspecified: Secondary | ICD-10-CM | POA: Diagnosis not present

## 2019-11-22 LAB — CBC
HCT: 29.2 % — ABNORMAL LOW (ref 39.0–52.0)
Hemoglobin: 8.7 g/dL — ABNORMAL LOW (ref 13.0–17.0)
MCH: 28.9 pg (ref 26.0–34.0)
MCHC: 29.8 g/dL — ABNORMAL LOW (ref 30.0–36.0)
MCV: 97 fL (ref 80.0–100.0)
Platelets: 267 10*3/uL (ref 150–400)
RBC: 3.01 MIL/uL — ABNORMAL LOW (ref 4.22–5.81)
RDW: 20.4 % — ABNORMAL HIGH (ref 11.5–15.5)
WBC: 23 10*3/uL — ABNORMAL HIGH (ref 4.0–10.5)
nRBC: 2.1 % — ABNORMAL HIGH (ref 0.0–0.2)

## 2019-11-22 LAB — CULTURE, BLOOD (ROUTINE X 2)
Culture: NO GROWTH
Culture: NO GROWTH
Special Requests: ADEQUATE
Special Requests: ADEQUATE

## 2019-11-22 LAB — HEPATIC FUNCTION PANEL
ALT: 121 U/L — ABNORMAL HIGH (ref 0–44)
AST: 73 U/L — ABNORMAL HIGH (ref 15–41)
Albumin: 1.8 g/dL — ABNORMAL LOW (ref 3.5–5.0)
Alkaline Phosphatase: 319 U/L — ABNORMAL HIGH (ref 38–126)
Bilirubin, Direct: 0.8 mg/dL — ABNORMAL HIGH (ref 0.0–0.2)
Indirect Bilirubin: 0.8 mg/dL (ref 0.3–0.9)
Total Bilirubin: 1.6 mg/dL — ABNORMAL HIGH (ref 0.3–1.2)
Total Protein: 9.1 g/dL — ABNORMAL HIGH (ref 6.5–8.1)

## 2019-11-22 LAB — APTT
aPTT: 54 seconds — ABNORMAL HIGH (ref 24–36)
aPTT: 60 seconds — ABNORMAL HIGH (ref 24–36)
aPTT: 75 seconds — ABNORMAL HIGH (ref 24–36)
aPTT: 84 seconds — ABNORMAL HIGH (ref 24–36)

## 2019-11-22 LAB — RENAL FUNCTION PANEL
Albumin: 1.8 g/dL — ABNORMAL LOW (ref 3.5–5.0)
Albumin: 1.8 g/dL — ABNORMAL LOW (ref 3.5–5.0)
Anion gap: 9 (ref 5–15)
Anion gap: 7 (ref 5–15)
BUN: 33 mg/dL — ABNORMAL HIGH (ref 8–23)
BUN: 30 mg/dL — ABNORMAL HIGH (ref 8–23)
CO2: 24 mmol/L (ref 22–32)
CO2: 27 mmol/L (ref 22–32)
Calcium: 7.4 mg/dL — ABNORMAL LOW (ref 8.9–10.3)
Calcium: 7.4 mg/dL — ABNORMAL LOW (ref 8.9–10.3)
Chloride: 100 mmol/L (ref 98–111)
Chloride: 100 mmol/L (ref 98–111)
Creatinine, Ser: 1.89 mg/dL — ABNORMAL HIGH (ref 0.61–1.24)
Creatinine, Ser: 1.73 mg/dL — ABNORMAL HIGH (ref 0.61–1.24)
GFR calc Af Amer: 42 mL/min — ABNORMAL LOW (ref >60)
GFR calc Af Amer: 47 mL/min — ABNORMAL LOW (ref >60)
GFR calc non Af Amer: 36 mL/min — ABNORMAL LOW (ref >60)
GFR calc non Af Amer: 41 mL/min — ABNORMAL LOW (ref >60)
Glucose, Bld: 175 mg/dL — ABNORMAL HIGH (ref 70–99)
Glucose, Bld: 149 mg/dL — ABNORMAL HIGH (ref 70–99)
Phosphorus: 2 mg/dL — ABNORMAL LOW (ref 2.5–4.6)
Phosphorus: 1.9 mg/dL — ABNORMAL LOW (ref 2.5–4.6)
Potassium: 4.3 mmol/L (ref 3.5–5.1)
Potassium: 4.2 mmol/L (ref 3.5–5.1)
Sodium: 133 mmol/L — ABNORMAL LOW (ref 135–145)
Sodium: 134 mmol/L — ABNORMAL LOW (ref 135–145)

## 2019-11-22 LAB — GLUCOSE, CAPILLARY
Glucose-Capillary: 166 mg/dL — ABNORMAL HIGH (ref 70–99)
Glucose-Capillary: 221 mg/dL — ABNORMAL HIGH (ref 70–99)
Glucose-Capillary: 178 mg/dL — ABNORMAL HIGH (ref 70–99)
Glucose-Capillary: 165 mg/dL — ABNORMAL HIGH (ref 70–99)
Glucose-Capillary: 116 mg/dL — ABNORMAL HIGH (ref 70–99)
Glucose-Capillary: 106 mg/dL — ABNORMAL HIGH (ref 70–99)
Glucose-Capillary: 145 mg/dL — ABNORMAL HIGH (ref 70–99)

## 2019-11-22 LAB — MAGNESIUM: Magnesium: 2.4 mg/dL (ref 1.7–2.4)

## 2019-11-22 LAB — COOXEMETRY PANEL
Carboxyhemoglobin: 1.5 % (ref 0.5–1.5)
Methemoglobin: 0.5 % (ref 0.0–1.5)
O2 Saturation: 53.1 %
Total hemoglobin: 9.3 g/dL — ABNORMAL LOW (ref 12.0–16.0)

## 2019-11-22 LAB — HEPARIN LEVEL (UNFRACTIONATED)
Heparin Unfractionated: 0.34 IU/mL (ref 0.30–0.70)
Heparin Unfractionated: 0.34 IU/mL (ref 0.30–0.70)
Heparin Unfractionated: 0.36 IU/mL (ref 0.30–0.70)

## 2019-11-22 MED ORDER — PHENOL 1.4 % MT LIQD
1.0000 | OROMUCOSAL | Status: DC | PRN
  Filled 2019-11-22: qty 177

## 2019-11-22 NOTE — Progress Notes (Signed)
ANTICOAGULATION CONSULT NOTE - Follow Up Consult  Pharmacy Consult for Heparin Indication: atrial fibrillation  Allergies  Allergen Reactions  . Baclofen Other (See Comments)    Possibly stroke like symptoms  . Iodinated Diagnostic Agents Swelling, Rash and Other (See Comments)    Other Reaction: burning to mouth, swelling of lips  . Lipitor [Atorvastatin] Other (See Comments)    Leg pain  . Metoprolol Other (See Comments)    Headaches     Patient Measurements: Height: 5' 6.5" (168.9 cm) Weight: 86.9 kg (191 lb 9.3 oz) IBW/kg (Calculated) : 64.95 Heparin Dosing Weight: 82.9 kg  Vital Signs: Temp: 98.3 F (36.8 C) (04/27 1530) Temp Source: Axillary (04/27 1530) BP: 101/66 (04/27 1618) Pulse Rate: 92 (04/27 1618)  Labs: Recent Labs    11/20/19 0353 11/20/19 0353 11/20/19 0626 11/20/19 0626 11/20/19 1522 11/21/19 0410 11/21/19 0410 11/21/19 1833 11/22/19 0012 11/22/19 0317 11/22/19 0800 11/22/19 1600  HGB 8.1*   < > 8.5*   < >  --  8.4*  --   --   --  8.7*  --   --   HCT 26.9*   < > 25.0*  --   --  27.9*  --   --   --  29.2*  --   --   PLT 213  --   --   --   --  233  --   --   --  267  --   --   APTT 44*  --   --    < >  --  41*   < >  --  54*  --  75* 60*  HEPARINUNFRC  --   --   --   --   --   --   --   --  0.34  --  0.36 0.34  CREATININE 2.31*   < >  --    < > 2.00* 1.96*   < > 2.01*  --  1.89*  --  1.73*  CKTOTAL  --   --   --   --  795*  --   --   --   --   --   --   --    < > = values in this interval not displayed.    Estimated Creatinine Clearance: 44.4 mL/min (A) (by C-G formula based on SCr of 1.73 mg/dL (H)).   Assessment: 46 YOM with re-do CABG and AVR on 4/1 with post-op Afib. The patient had been managed on Apixaban however this was held on 4/23 due to a drop in Hgb. The patient received 2 units of PRBC on 4/24 and Hgb has been low but stable since. Pharmacy consulted to restart anticoagulation for Afib with Heparin.   Of noting, the patient  did have a drop in platelets earlier this admission concerning for HIT however has since tested negative. On 4/26 RE-started Heparin close to the previously known therapeutic rate.  4/27 PM update: APTT low at 60; Heparin level 0.34. CBC stable. No bleeding or issues with infusion per discussion with RN.  Goal of Therapy:  Heparin level 0.3-0.5 units/ml aPTT 66-85 seconds Monitor platelets by anticoagulation protocol: Yes   Plan:  Increase heparin to 1300 units/hr 6h aPTT Monitor daily heparin level and aPTT until correlating, CBC, s/sx bleeding   Arturo Morton, PharmD, BCPS Please check AMION for all [place] contact numbers Clinical Pharmacist 11/22/2019 4:48 PM

## 2019-11-22 NOTE — Progress Notes (Signed)
Patient ID: Patrick [name], male   DOB: April 28, 1956, 64 y.o.   MRN: [id number] [facility] Progress Note   Assessment/ Plan:   1.  Redo coronary artery bypass grafting complicated by cardiogenic shock: Patient with history of coronary artery disease/NSTEMI.  Remains on pressors and intubated/ventilator dependent with continued efforts at weaning as permitted by volume status. 2. ESRD: He is currently on CRRT given hemodynamic status and need for aggressive ultrafiltration.  We will continue current CRRT prescription to try and aggressively lower dry weight while on pressors; will plan for conversion to intermittent hemodialysis when he is off pressors and possibly after he is extubated. 3. Anemia: With low hemoglobin and hematocrit, continue on Aranesp with intention for PRBC transfusion when hemoglobin <8. 4. CKD-MBD: Marginally low phosphorus, calcium corrects with albumin.  Continue replacement as indicated on CRRT. 5. Nutrition: Enteral nutrition with supplementation per CCM. 6.  Atrial fibrillation: With controlled ventricular response, remains on pressors.  Subjective:   No acute events overnight noted, tolerating CRRT without problem.   Objective:   BP (!) 121/46   Pulse 92   Temp 98.7 F (37.1 C) (Oral)   Resp (!) 22   Ht 5' 6.5" (1.689 m)   Wt 86.9 kg   SpO2 98%   BMI 30.46 kg/m   Physical Exam: Gen: Appears to be comfortable resting in bed, intubated but awake/alert CVS: Pulse regular rhythm, normal rate, S1 and S2 normal.  Intact sternotomy scar/staples Resp: Anteriorly clear to auscultation, no rales/rhonchi Abd: Soft, obese, nontender Ext: No lower extremity edema.  Labs: BMET Recent Labs  Lab 11/19/19 0352 11/19/19 0426 11/19/19 1544 11/20/19 0353 11/20/19 0626 11/20/19 1522 11/21/19 0410 11/21/19 1833 11/22/19 0317  NA 136   < > 134* 134* 139 134* 133* 130* 133*  K 5.5*   < > 4.8 4.8 4.5 5.0 4.6 4.6 4.3  CL 104  --  100 102  --  101 100 98  100  CO2 23  --  23 26  --  _0 GLUCOSE 220*  --  228* 251*  --  199* 239* 217* 175*  BUN 54*  --  41* 36*  --  33* 31* 32* 33*  CREATININE 4.00*  --  2.85* 2.31*  --  2.00* 1.96* 2.01* 1.89*  CALCIUM 7.6*  --  7.4* 7.2*  --  7.2* 7.2* 7.1* 7.4*  PHOS 3.6  --  2.7 2.4*  --  2.4* 1.9* 1.9* 2.0*   < > = values in this interval not displayed.   CBC Recent Labs  Lab 11/19/19 1544 11/19/19 1635 11/20/19 0353 11/20/19 0626 11/21/19 0410 11/22/19 0317  WBC 15.2*  --  13.4*  --  17.1* 23.0*  HGB 6.9*   < > 8.1* 8.5* 8.4* 8.7*  HCT 22.5*   < > 26.9* 25.0* 27.9* 29.2*  MCV 94.9  --  96.1  --  96.9 97.0  PLT 249  --  213  --  233 267   < > = values in this interval not displayed.     Medications:    . amiodarone  200 mg Per Tube BID  . chlorhexidine gluconate (MEDLINE KIT)  15 mL Mouth Rinse BID  . Chlorhexidine Gluconate Cloth  6 each Topical Daily  . clopidogrel  75 mg Per Tube Daily  . darbepoetin (ARANESP) injection - NON-DIALYSIS  60 mcg Subcutaneous Q Sat-1800  . docusate  100 mg Per Tube BID  . feeding supplement (  PRO-STAT SUGAR FREE 64)  60 mL Per Tube BID  . insulin aspart  0-9 Units Subcutaneous Q4H  . insulin glargine  6 Units Subcutaneous Daily  . mouth rinse  15 mL Mouth Rinse 10 times per day  . midodrine  15 mg Per Tube TID WC  . multivitamin  1 tablet Per Tube QHS  . pantoprazole sodium  40 mg Per Tube Daily  . rosuvastatin  10 mg Per Tube q1800  . sodium chloride flush  10-40 mL Intracatheter Q12H   Elmarie Shiley, MD 11/22/2019, 7:02 AM

## 2019-11-22 NOTE — Progress Notes (Addendum)
Patient ID: Patrick [name], male   DOB: 1956-03-08, 64 y.o.   MRN: [id number]     Advanced Heart Failure Rounding Note  PCP-Cardiologist: Dorris Carnes, MD     Patient Profile   64 y/o male w/ h/o CAD s/p remote CABG in 1997 w/ LIMA-LAD, failed kidney transplant, end-stage renal disease on dialysis admitted w/ CP. LHC w/ severe CAD. Echo w/ reduced EF 35-40% and moderate AI. S/p redo CABG x 3 on 4/1 (SVG-LAD, SVG-PDA, SVG-OM) + bioprosthetic AVR. Post operative course c/b by cardiogenic shock, respiratory failure, VT, and protein malnutrition.    Subjective:    Patient had respiratory arrest with PEA while in CT scanner 4/23, had 5 minutes CPR with drugs, achieved ROSC and was intubated.  K noted > 7.    Remains intubated. Awake. Follows commands.   On CVVHD pulling -150 cc/hr net.   On NE 18 mcg, VP 0.03. CVP 2-3 this morning.    Objective:   Weight Range: 86.9 kg Body mass index is 30.46 kg/m.   Vital Signs:   Temp:  [97.8 F (36.6 C)-99.3 F (37.4 C)] 97.8 F (36.6 C) (04/27 0747) Pulse Rate:  [89-133] 92 (04/27 0748) Resp:  [12-28] 18 (04/27 0800) BP: (121-132)/(46-51) 121/46 (04/26 1550) SpO2:  [86 %-100 %] 95 % (04/27 0748) Arterial Line BP: (88-159)/(43-80) 88/80 (04/27 0800) FiO2 (%):  [30 %-40 %] 30 % (04/27 0748) Weight:  [86.9 kg] 86.9 kg (04/27 0500) Last BM Date: 11/22/19  Weight change: Filed Weights   11/20/19 0500 11/21/19 0500 11/22/19 0500  Weight: 93.6 kg 91.6 kg 86.9 kg    Intake/Output:   Intake/Output Summary (Last 24 hours) at 11/22/2019 0856 Last data filed at 11/22/2019 0800 Gross per 24 hour  Intake 2149.8 ml  Output 5134 ml  Net -2984.2 ml      Physical Exam   CVP 2-3  General: Awake on vent Neck: No JVD, no thyromegaly or thyroid nodule.  Lungs: Decreased at bases.  CV: Nondisplaced PMI.  Heart regular S1/S2, no S3/S4, no murmur.  No peripheral edema.   Abdomen: Soft, nontender, no hepatosplenomegaly, no distention.  Skin:  Intact without lesions or rashes.  Neurologic: Follows commands Extremities: No clubbing or cyanosis.  HEENT: Normal.    Telemetry   A paced 90s   Labs    CBC Recent Labs    11/21/19 0410 11/22/19 0317  WBC 17.1* 23.0*  HGB 8.4* 8.7*  HCT 27.9* 29.2*  MCV 96.9 97.0  PLT 233 962   Basic Metabolic Panel Recent Labs    11/21/19 0410 11/21/19 0410 11/21/19 1833 11/22/19 0317  NA 133*   < > 130* 133*  K 4.6   < > 4.6 4.3  CL 100   < > 98 100  CO2 27   < > 24 24  GLUCOSE 239*   < > 217* 175*  BUN 31*   < > 32* 33*  CREATININE 1.96*   < > 2.01* 1.89*  CALCIUM 7.2*   < > 7.1* 7.4*  MG 2.3  --   --  2.4  PHOS 1.9*   < > 1.9* 2.0*   < > = values in this interval not displayed.   Liver Function Tests Recent Labs    11/21/19 0410 11/21/19 0410 11/21/19 1833 11/22/19 0317  AST 131*  --   --  73*  ALT 156*  --   --  121*  ALKPHOS 324*  --   --  319*  BILITOT 1.9*  --   --  1.6*  PROT 8.3*  --   --  9.1*  ALBUMIN 1.7*  1.7*   < > 1.7* 1.8*  1.8*   < > = values in this interval not displayed.   No results for input(s): LIPASE, AMYLASE in the last 72 hours. Cardiac Enzymes Recent Labs    11/20/19 1522  CKTOTAL 795*    BNP: BNP (last 3 results) Recent Labs    11/20/19 0353 11/21/19 0410  BNP 374.6* 519.5*    ProBNP (last 3 results) No results for input(s): PROBNP in the last 8760 hours.   D-Dimer No results for input(s): DDIMER in the last 72 hours. Hemoglobin A1C No results for input(s): HGBA1C in the last 72 hours. Fasting Lipid Panel No results for input(s): CHOL, HDL, LDLCALC, TRIG, CHOLHDL, LDLDIRECT in the last 72 hours. Thyroid Function Tests No results for input(s): TSH, T4TOTAL, T3FREE, THYROIDAB in the last 72 hours.  Invalid input(s): FREET3  Other results:   Imaging    DG CHEST PORT 1 VIEW  Result Date: 11/22/2019 CLINICAL DATA:  Intubation and respiratory failure EXAM: PORTABLE CHEST 1 VIEW COMPARISON:  Yesterday FINDINGS:  Endotracheal tube with tip halfway between the clavicular heads and carina. The feeding tube at least reaches the stomach. Right-sided central line with tip at the SVC. Left-sided dialysis catheter with tip at the right atrium. Cardiomegaly.  There has been aortic valve replacement and CABG. Haziness of the bilateral chest from pleural fluid and atelectasis by recent chest CT. IMPRESSION: 1. Stable hardware positioning. 2. Stable hazy chest opacification from atelectasis and pleural fluid by recent CT. Electronically Signed   By: Monte Fantasia M.D.   On: 11/22/2019 07:58   US Abdomen Limited RUQ  Result Date: 11/21/2019 CLINICAL DATA:  Hyperbilirubinemia EXAM: ULTRASOUND ABDOMEN LIMITED RIGHT UPPER QUADRANT COMPARISON:  CT 11/10/2019 FINDINGS: Gallbladder: No gallstones or wall thickening visualized. No sonographic Murphy sign noted by sonographer. Common bile duct: Diameter: 4.2 mm Liver: Subtle contour nodularity is suggested. No focal hepatic abnormality. Liver appears hypoechoic. Portal vein is patent on color Doppler imaging with normal direction of blood flow towards the liver. 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 IMPRESSION: 1. Negative for gallstone or biliary dilatation 2. Possible subtle contour nodularity of the liver as may be seen with early changes of cirrhosis. Electronically Signed   By: Donavan Foil M.D.   On: 11/21/2019 19:49     Medications:     Scheduled Medications: . amiodarone  200 mg Per Tube BID  . chlorhexidine gluconate (MEDLINE KIT)  15 mL Mouth Rinse BID  . Chlorhexidine Gluconate Cloth  6 each Topical Daily  . clopidogrel  75 mg Per Tube Daily  . darbepoetin (ARANESP) injection - NON-DIALYSIS  60 mcg Subcutaneous Q Sat-1800  . docusate  100 mg Per Tube BID  . feeding supplement (PRO-STAT SUGAR FREE 64)  60 mL Per Tube BID  . insulin aspart  0-9 Units Subcutaneous Q4H  . insulin glargine  6 Units Subcutaneous Daily  . mouth rinse  15 mL Mouth  Rinse 10 times per day  . midodrine  15 mg Per Tube TID WC  . multivitamin  1 tablet Per Tube QHS  . pantoprazole sodium  40 mg Per Tube Daily  . rosuvastatin  10 mg Per Tube q1800  . sodium chloride flush  10-40 mL Intracatheter Q12H    Infusions: .  prismasol BGK 4/2.5 400 mL/hr at 11/22/19 0844  .  sodium chloride Stopped (11/21/19 1749)  . sodium chloride    . sodium chloride    . feeding supplement (VITAL 1.5 CAL) 1,000 mL (11/22/19 0636)  . fentaNYL infusion INTRAVENOUS 75 mcg/hr (11/22/19 0800)  . heparin 1,200 Units/hr (11/22/19 0800)  . norepinephrine (LEVOPHED) Adult infusion 18 mcg/min (11/22/19 0800)  . prismasol BGK 0/2.5 300 mL/hr at 11/22/19 0326  . prismasol BGK 4/2.5 1,800 mL/hr at 11/22/19 0636  . vasopressin (PITRESSIN) infusion - *FOR SHOCK* 0.03 Units/min (11/22/19 0800)    PRN Medications: sodium chloride, Place/Maintain arterial line **AND** sodium chloride, Place/Maintain arterial line **AND** sodium chloride, acetaminophen (TYLENOL) oral liquid 160 mg/5 mL, alteplase, dextrose, diphenhydrAMINE, fentaNYL, fentaNYL (SUBLIMAZE) injection, Gerhardt's butt cream, heparin, heparin, levalbuterol, lip balm, metoprolol tartrate, midazolam, ondansetron (ZOFRAN) IV, oxyCODONE, sodium chloride, sodium chloride flush   Assessment/Plan   1. Cardiogenic shock: Post-op.  Echo 4/14 with EF 30-35%, stable aortic valve replacement.  Echo on 4/21 with EF 45-50% and significant respirophasic septal variation.  Pericardium in some places appears thickened though windows are poor.  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 CT chest 4/23, difficult images as the patient became unstable during the scan. There did not appear to be significant pericardial pathology. S/p PEA arrest 4/23. Now on NE 18, VP 0.03, midodrine 15 tid.  CVP 2-3.  - Can decrease CVVH rate, aim for even to net 50 cc/hr negative.  - Hopefully can wean NE as we slow CVVH.    2. ESRD: continue CVVHD as above. I think we have him dry, will back off on CVVH UF rate as above.  - Back to iHD after extubation.  3. Possible aspiration PNA: He has completed empiric vanco/meropenem.  4. Atrial fibrillation: Paroxysmal.  Now a-paced at 90.  - Continue amiodarone 200 mg bid for now. - Now off apixaban after arrest. Continue heparin gtt until extubated.  5. Anemia, post-op: Hgb 8.7 today.  6. CAD: Redo CABG x 3 this admission on 4/1 (SVG-LAD, SVG-PDA, SVG-OM).   - Continue Plavix and Crestor.  7. VT:  Frequent NSVT, now on Amiodarone with improvement. 8 Acute hypoxic respiratory failure: due to pulmonary edema and aspiration PNA.  Re-intubated after PEA arrest 4/23. Think we have him dry at this point with CVP 2-3 and weight way down.  - Hopefully extubate today.   9. ID: Afebrile.  Cultures NGTD. C diff negative. Sputum GS 4/23. Rare GNR.  10. PEA arrest on 4/23.  Resuscitated with CPR.  Suspect due to hypoxemia.  11. Elevated LFTs: ?Shock liver.  RUQ US unremarkable.  - Trending down.   CRITICAL CARE Performed by: Loralie Champagne  Total critical care time: 35 minutes  Critical care time was exclusive of separately billable procedures and treating other patients.  Critical care was necessary to treat or prevent imminent or life-threatening deterioration.  Critical care was time spent personally by me on the following activities: development of treatment plan with patient and/or surrogate as well as nursing, discussions with consultants, evaluation of patient's response to treatment, examination of patient, obtaining history from patient or surrogate, ordering and performing treatments and interventions, ordering and review of laboratory studies, ordering and review of radiographic studies, pulse oximetry and re-evaluation of patient's condition.   Length of Stay: [place], MD  11/22/2019, 8:56 AM  Advanced Heart Failure Team Pager [phone] (M-F; 7a - 4p)

## 2019-11-22 NOTE — Progress Notes (Signed)
Post extubation placed on BIPAP per MD order.

## 2019-11-22 NOTE — Plan of Care (Signed)
  Problem: Clinical Measurements: Goal: Respiratory complications will improve Outcome: Progressing   Problem: Activity: Goal: Risk for activity intolerance will decrease Outcome: Progressing   Problem: Skin Integrity: Goal: Wound healing without signs and symptoms of infection Outcome: Progressing   Problem: Skin Integrity: Goal: Risk for impaired skin integrity will decrease Outcome: Progressing

## 2019-11-22 NOTE — Progress Notes (Signed)
ANTICOAGULATION CONSULT NOTE - Follow Up Consult  Pharmacy Consult for Heparin Indication: atrial fibrillation  Allergies  Allergen Reactions  . Baclofen Other (See Comments)    Possibly stroke like symptoms  . Iodinated Diagnostic Agents Swelling, Rash and Other (See Comments)    Other Reaction: burning to mouth, swelling of lips  . Lipitor [Atorvastatin] Other (See Comments)    Leg pain  . Metoprolol Other (See Comments)    Headaches     Patient Measurements: Height: 5' 6.5" (168.9 cm) Weight: 86.9 kg (191 lb 9.3 oz) IBW/kg (Calculated) : 64.95 Heparin Dosing Weight: 84 kg  Vital Signs: Temp: 97.8 F (36.6 C) (04/27 0747) Temp Source: Axillary (04/27 0747) Pulse Rate: 92 (04/27 0748)  Labs: Recent Labs    11/20/19 0353 11/20/19 0353 11/20/19 1540 11/20/19 0626 11/20/19 1522 11/21/19 0410 11/21/19 1833 11/22/19 0012 11/22/19 0317 11/22/19 0800  HGB 8.1*   < > 8.5*   < >  --  8.4*  --   --  8.7*  --   HCT 26.9*   < > 25.0*  --   --  27.9*  --   --  29.2*  --   PLT 213  --   --   --   --  233  --   --  267  --   APTT 44*  --   --    < >  --  41*  --  54*  --  75*  HEPARINUNFRC  --   --   --   --   --   --   --  0.34  --  0.36  CREATININE 2.31*   < >  --    < > 2.00* 1.96* 2.01*  --  1.89*  --   CKTOTAL  --   --   --   --  795*  --   --   --   --   --    < > = values in this interval not displayed.    Estimated Creatinine Clearance: 40.7 mL/min (A) (by C-G formula based on SCr of 1.89 mg/dL (H)).   Assessment: 35 YOM with re-do CABG and AVR on 4/1 with post-op Afib. The patient had been managed on Apixaban however this was held on 4/23 due to a drop in Hgb. The patient received 2 units of PRBC on 4/24 and Hgb has been low but stable since. Pharmacy consulted to restart anticoagulation for Afib with Heparin.   Of noting, the patient did have a drop in platelets earlier this admission concerning for HIT however has since tested negative. On 4/26 RE-started  Heparin close to the previously known therapeutic rate.  4/27 AM update: APTT 75, therapeutic; Heparin level 0.36, therapeutic on heparin gtt at 1200 units/hr. Hbg 8.7 (low, but stable/), plt wnl. No issues or bleeding concern per RN  Goal of Therapy:  Heparin level 0.3-0.5 units/ml aPTT 66-85 seconds Monitor platelets by anticoagulation protocol: Yes   Plan:  Continue heparin to 1200 units/hr -Re-check heparin level and aPTT for confirmatory -Monitor daily heparin level, CBC, and s/sx of bleeding   Acey Lav, PharmD  PGY1 Acute Care Pharmacy Resident

## 2019-11-22 NOTE — Progress Notes (Signed)
ANTICOAGULATION CONSULT NOTE - Follow Up Consult  Pharmacy Consult for Heparin Indication: atrial fibrillation  Allergies  Allergen Reactions  . Baclofen Other (See Comments)    Possibly stroke like symptoms  . Iodinated Diagnostic Agents Swelling, Rash and Other (See Comments)    Other Reaction: burning to mouth, swelling of lips  . Lipitor [Atorvastatin] Other (See Comments)    Leg pain  . Metoprolol Other (See Comments)    Headaches     Patient Measurements: Height: 5' 6.5" (168.9 cm) Weight: 86.9 kg (191 lb 9.3 oz) IBW/kg (Calculated) : 64.95 Heparin Dosing Weight: 82.9 kg  Vital Signs: Temp: 97.6 F (36.4 C) (04/27 2300) Temp Source: Axillary (04/27 2300) BP: 110/81 (04/27 2100) Pulse Rate: 92 (04/27 2305)  Labs: Recent Labs    11/20/19 0353 11/20/19 0353 11/20/19 0626 11/20/19 0626 11/20/19 1522 11/21/19 0410 11/21/19 0410 11/21/19 1833 11/22/19 0012 11/22/19 0012 11/22/19 0317 11/22/19 0800 11/22/19 1600 11/22/19 2248  HGB 8.1*   < > 8.5*   < >  --  8.4*  --   --   --   --  8.7*  --   --   --   HCT 26.9*   < > 25.0*  --   --  27.9*  --   --   --   --  29.2*  --   --   --   PLT 213  --   --   --   --  233  --   --   --   --  267  --   --   --   APTT 44*  --   --    < >  --  41*   < >  --  54*   < >  --  75* 60* 84*  HEPARINUNFRC  --   --   --   --   --   --   --   --  0.34  --   --  0.36 0.34  --   CREATININE 2.31*   < >  --    < > 2.00* 1.96*   < > 2.01*  --   --  1.89*  --  1.73*  --   CKTOTAL  --   --   --   --  795*  --   --   --   --   --   --   --   --   --    < > = values in this interval not displayed.    Estimated Creatinine Clearance: 44.4 mL/min (A) (by C-G formula based on SCr of 1.73 mg/dL (H)).   Assessment: 30 YOM with re-do CABG and AVR on 4/1 with post-op Afib. The patient had been managed on Apixaban however this was held on 4/23 due to a drop in Hgb. The patient received 2 units of PRBC on 4/24 and Hgb has been low but stable  since. Pharmacy consulted to restart anticoagulation for Afib with Heparin.   Of noting, the patient did have a drop in platelets earlier this admission concerning for HIT however has since tested negative. On 4/26 Restarted Heparin close to the previously known therapeutic rate.  PTT 84 sec (therapeutic) on gtt at 1300 units/hr. No bleeding noted.  Goal of Therapy:  Heparin level 0.3-0.5 units/ml aPTT 66-85 seconds Monitor platelets by anticoagulation protocol: Yes   Plan:  Continue heparin at 1300 units/hr Monitor daily heparin level and aPTT until correlating, CBC,  s/sx bleeding  Sherlon Handing, PharmD, BCPS Please see amion for complete clinical pharmacist phone list 11/22/2019 11:28 PM

## 2019-11-22 NOTE — Procedures (Signed)
Extubation Procedure Note  Patient Details:   Name: Patrick [name] DOB: 20-Nov-1955 MRN: [id number]   Airway Documentation:    Vent end date: 11/22/19 Vent end time: 1014   Evaluation  O2 sats: stable throughout Complications: No apparent complications Patient did tolerate procedure well. Bilateral Breath Sounds: Diminished, Clear  Patient able to speak: Yes  Rudene Re 11/22/2019, 10:15 AM

## 2019-11-22 NOTE — Progress Notes (Signed)
Writer wasted 100 mL of Fentanyl with witness Yasmin A. RN in stericycle.

## 2019-11-22 NOTE — Progress Notes (Signed)
ANTICOAGULATION CONSULT NOTE - Follow Up Consult  Pharmacy Consult for Heparin Indication: atrial fibrillation  Allergies  Allergen Reactions  . Baclofen Other (See Comments)    Possibly stroke like symptoms  . Iodinated Diagnostic Agents Swelling, Rash and Other (See Comments)    Other Reaction: burning to mouth, swelling of lips  . Lipitor [Atorvastatin] Other (See Comments)    Leg pain  . Metoprolol Other (See Comments)    Headaches     Patient Measurements: Height: 5' 6.5" (168.9 cm) Weight: 91.6 kg (201 lb 15.1 oz) IBW/kg (Calculated) : 64.95 Heparin Dosing Weight: 84 kg  Vital Signs: Temp: 99 F (37.2 C) (04/26 1500) Temp Source: Oral (04/26 1500) BP: 121/46 (04/26 1550) Pulse Rate: 96 (04/26 2350)  Labs: Recent Labs    11/19/19 1544 11/19/19 1635 11/20/19 0353 11/20/19 0353 11/20/19 0626 11/20/19 1522 11/21/19 0410 11/21/19 1833 11/22/19 0012  HGB 6.9*   < > 8.1*   < > 8.5*  --  8.4*  --   --   HCT 22.5*   < > 26.9*  --  25.0*  --  27.9*  --   --   PLT 249  --  213  --   --   --  233  --   --   APTT  --   --  44*  --   --   --  41*  --  54*  HEPARINUNFRC  --   --   --   --   --   --   --   --  0.34  CREATININE 2.85*   < > 2.31*   < >  --  2.00* 1.96* 2.01*  --   CKTOTAL  --   --   --   --   --  795*  --   --   --    < > = values in this interval not displayed.    Estimated Creatinine Clearance: 39.2 mL/min (A) (by C-G formula based on SCr of 2.01 mg/dL (H)).   Assessment: 65 YOM with re-do CABG and AVR on 4/1 with post-op Afib. The patient had been managed on Apixaban however this was held on 4/23 due to a drop in Hgb. The patient received 2 units of PRBC on 4/24 and Hgb has been low but stable since. Pharmacy consulted to restart anticoagulation for Afib with Heparin.   Of noting, the patient did have a drop in platelets earlier this admission concerning for HIT however has since tested negative. Plts 126 today. Will start Heparin close to the  previously known therapeutic rate and monitor aPTTs initially (last Apix dose 4/23 AM) and will check a HL/aPTT in the AM for correlation.  4/27 AM update:  APTT below goal No issues per RN  Goal of Therapy:  Heparin level 0.3-0.5 units/ml aPTT 66-85 seconds Monitor platelets by anticoagulation protocol: Yes   Plan:  -Inc heparin to 1200 units/hr -Re-check heparin level and aPTT at [place], PharmD, [place] Pharmacist Phone: [phone]

## 2019-11-22 NOTE — Progress Notes (Signed)
T[facility] BRIEF SICU PROGRESS NOTE  26 Days Post-Op  S/P Procedure(s) (LRB): REDO CORONARY ARTERY BYPASS GRAFTING TIMES 3  USING LEFT GREATER SAPHENOUS LEG VEIN HARVESTED ENDOSCOPICALLY(CABG) (N/A) TRANSESOPHAGEAL ECHOCARDIOGRAM (TEE) (N/A) AORTIC VALVE REPLACEMENT (AVR) USING EDWARDS INTUITY 23 MM AORTIC VALVE. (N/A) Endovein Harvest Of Greater Saphenous Vein (Left)   Breathing okay on BiPAP w/ O2 sats 100% Currently on CRRT pulling 50 mL/hr, CVP low BP reasonably stable but still on vasopressin and levophed for BP support  Plan: Continue current plan  Rexene Alberts, MD 11/22/2019 6:31 PM

## 2019-11-22 NOTE — Progress Notes (Signed)
NAME:  Patrick [name], MRN:  [id number], DOB:  [birth year], LOS: 44 ADMISSION DATE:  10/24/2019, CONSULTATION DATE:  11/17/2020 REFERRING MD:  Kirk Ruths, CHIEF COMPLAINT:  Respiratory Arrest 4/23   Brief History   64 y/o male w/ h/o CAD, HTN, HLD, DM, ESRD.CABG x 1 1997, failed kidney transplant,end-stage renal disease on dialysis admitted 3/29  w/ CP, NSTEMI . He underwent  re-do CABG x 3  on 4/1. Post operative course c/b by cardiogenic shock, respiratory failure, VT,illius, and protein malnutrition. Pt. Had respiratory /  PEA arrest in CT on 4/23 requiring intubation. PCCM have been asked to consult  for respiratory failure/ ventilation management post arrest.  Past Medical History  CAD, HTN, HLD, DM, ESRD.  NSTEMI 10/25/2019 10/25/2019: LHC w/ severe CAD. Echo w/ reduced EF 35-40% and moderate AI. CABG Re-Do x 3 on 10/27/2019(SVG-LAD, SVG-PDA, SVG-OM).    RCA PCI 01/2019 Atrial Flutter s/p ablation 2018 CABG 1997 w / LIMA-LAD Combined systolic/ Diastolic Heart Failure Failed kidney transplant,end-stage renal disease on dialysis   Significant Hospital Events   3/29 Admission 4/1 CABG x 3 ( Re-do) 4/23 Respiratory / PEA  Arrest  4/23 Intubation and PCCM consult  Consults:  3/29 Renal Consult 3/30 TCTS Consult 4/23 PCCM  Procedures:  4/1 CABG x 3 (SVG-LAD, SVG-PDA, SVG-OM).  Significant Diagnostic Tests:  11/19/2019 [facility] Cardiology  IMPRESSION: 1. Moderate to large bilateral pleural effusions are identified. The left pleural effusion appears partially loculated with loculated fluid along the paramediastinal left upper lobe. 2. There is compressive type atelectasis and consolidation associated with bilateral pleural effusions with considerable reduction in aerated lung volumes bilaterally. 3. Aortic atherosclerosis. IMPRESSION: Difficult images. There does not appear to be significant pericardial pathology.   11/18/2019 CXR Intubated, endotracheal tube tip in good position.  Otherwise stable lines and tubes. Improved lung volumes since 0907 hours today.  Small bilateral pleural effusions with compressive lung base atelectasis better demonstrated on the CT images today 1045 hours.  Echo 4/14 with EF 30-35%, stable aortic valve replacement.    Echo 4/21 with EF 45-50% and significant respirophasic septal variation.  Pericardium in some places appears thickened though windows are poor.  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  Cath 10/25/2019 . Severe 3 vessel CAD    - CTO of the mid LAD after the first diagonal    - 90% ostial LCx    - 100% mid RCA in stent. Left to right collaterals. 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 Atretic LIMA graft Mildly elevated LVEDP Early restenosis/occlusion of the RCA. This vessel is diffusely diseased and heavily calcified. The ostial LCx appears new compared to prior.  Percutaneous options are very limited Micro Data:  4/23 respiratory>> normal flora 4/22 blood>> NG 4/14 Blood>> No growth 4/11 Blood >> No Growth 4/8  Respiratory>> Normal Flora 4/8/ Blood >> No Growth 3/29 SARS Coronavirus 2 >> Negative Antimicrobials:  Vanc 4/11-4/17 Meropenem 4/11-4/17  Interim history/subjective:  No acute events overnight. Tolerating SBT again this morning.  Objective   Blood pressure (!) 121/46, pulse 92, temperature 97.8 F (36.6 C), temperature source Axillary, resp. rate 18, height 5' 6.5" (1.689 m), weight 86.9 kg, SpO2 95 %. CVP:  [0 mmHg-16 mmHg] 5 mmHg  Vent Mode: CPAP;PSV FiO2 (%):  [30 %-40 %] 30 % Set Rate:  [22 bmp] 22 bmp Vt Set:  [510 mL] 510 mL PEEP:  [5 cmH20] 5 cmH20 Pressure Support:  [5 [place]  Pressure:  [16 cmH20-23 cmH20] 16 cmH20   Intake/Output Summary (Last 24 hours) at 11/22/2019 0846 Last data filed at 11/22/2019 0800 Gross per 24 hour  Intake 2149.8 ml  Output 5134 ml  Net -2984.2 ml   Filed Weights    11/20/19 0500 11/21/19 0500 11/22/19 0500  Weight: 93.6 kg 91.6 kg 86.9 kg    Examination: General: Sleeping comfortably in bed, arouses easily to verbal stimulation HENT: [place]/AT, eyes anicteric.  ETT and NGT in place  lungs: Breathing comfortably on 5+5, clear to auscultation bilaterally. Cardiovascular: Regular rate and rhythm, paced rhythm. Abdomen: Soft, nontender, nondistended.  Pacing wires. Extremities: No clubbing, cyanosis, or edema Neuro: RASS -1, moving all extremities on command.  Able to lift his head off the bed. Skin: Warm and dry, no rashes  Resolved Hospital Problem list   Diarrhea   Assessment & Plan:  Acute Hypoxic Respiratory Failure / PEA Arrest while in CT 4/23 - appears to be multifactorial from pulmonary edema, bilateral pleural effusions (possibly loculated), suspected aspiration, undiagnosed OSA/OHS, and poor respiratory mechanics.  Left pleural effusion minimal on bedside ultrasound 4/26. Plan -SAT and SBT today.  Planning to extubate to BiPAP. -Needs nocturnal positive pressure ventilation given concern for OSA.  Will need outpatient evaluation and PSG as well. -Pulmonary toilet -Maintain euvolemia  Elevated LFTs-improving.  No evidence of gallbladder pathology. -Okay to continue rosuvastatin and amiodarone for now.  -Continue to monitor  Post-arrest leukocytosis -worsening.  No obvious source of infection.  No fevers. -Continue to monitor -Continue to evaluate for potential sources of infection.  Concerning with endovascular devices. -Consider reculturing   ESRD with post-arrest hyperkalemia  -Appreciate nephrology's input -Renally dose medications -Strict I/O  Hyperglycemia, uncontrolled -Will hold off on starting long-acting insulin given the tube feeds are held today for extubation -Goal BG 140-180 while mated to the ICU -Continue sliding scale insulin  Hypotension post arrest Cardiogenic shock -Continue norepinephrine and vasopressin to  maintain MAP greater than 65 -Titrate down vasopressors as tolerated.  AoC anemia of renal disease - stable  -Continue to monitor -Transfuse for hemoglobin less than 8 or hemodynamically significant bleeding  Best practice:  Diet: TF  Pain/Anxiety/Delirium protocol (if indicated): Fentanyl with prn Versed VAP protocol (if indicated): Initiated DVT prophylaxis: heparin GI prophylaxis: Protonix Glucose control: CBG with SSI Mobility: BR Code Status: Full Family Communication: Wife updated at bedside Disposition: ICU  Labs   CBC: Recent Labs  Lab 11/18/19 1101 11/18/19 1141 11/19/19 1544 11/19/19 1544 11/19/19 1635 11/20/19 0353 11/20/19 0626 11/21/19 0410 11/22/19 0317  WBC 18.0*  --  15.2*  --   --  13.4*  --  17.1* 23.0*  HGB 7.0*   < > 6.9*   < > 6.8* 8.1* 8.5* 8.4* 8.7*  HCT 23.7*   < > 22.5*   < > 22.3* 26.9* 25.0* 27.9* 29.2*  MCV 98.3  --  94.9  --   --  96.1  --  96.9 97.0  PLT 219  --  249  --   --  213  --  233 267   < > = values in this interval not displayed.    Basic Metabolic Panel: Recent Labs  Lab 11/18/19 1600 11/18/19 2028 11/19/19 0342 11/19/19 0352 11/20/19 0353 11/20/19 0353 11/20/19 0626 11/20/19 1522 11/21/19 0410 11/21/19 1833 11/22/19 0317  NA 133*  --  136   < > 134*   < > 139 134* 133* 130* 133*  K 7.1*   < >  5.5*   < > 4.8   < > 4.5 5.0 4.6 4.6 4.3  CL 103  --  104   < > 102  --   --  101 100 98 100  CO2 20*  --  24   < > 26  --   --  27 27 24 24   GLUCOSE 364*  --  222*   < > 251*  --   --  199* 239* 217* 175*  BUN 80*  --  54*   < > 36*  --   --  33* 31* 32* 33*  CREATININE 6.27*  --  3.72*   < > 2.31*  --   --  2.00* 1.96* 2.01* 1.89*  CALCIUM 7.9*  --  7.5*   < > 7.2*  --   --  7.2* 7.2* 7.1* 7.4*  MG 2.8*  --  2.6*  --  2.4  --   --   --  2.3  --  2.4  PHOS 4.5  --   --    < > 2.4*  --   --  2.4* 1.9* 1.9* 2.0*   < > = values in this interval not displayed.   GFR: Estimated Creatinine Clearance: 40.7 mL/min (A) (by C-G  formula based on SCr of 1.89 mg/dL (H)). Recent Labs  Lab 11/18/19 1101 11/18/19 1323 11/18/19 1611 11/19/19 0342 11/19/19 1544 11/20/19 0353 11/21/19 0410 11/22/19 0317  WBC   < >  --   --   --  15.2* 13.4* 17.1* 23.0*  LATICACIDVEN  --  2.0* 1.7 1.1  --   --   --   --    < > = values in this interval not displayed.    Liver Function Tests: Recent Labs  Lab 11/18/19 1101 11/18/19 1600 11/19/19 0342 11/19/19 0352 11/20/19 0353 11/20/19 1522 11/21/19 0410 11/21/19 1833 11/22/19 0317  AST 113*  --  86*  --  251*  --  131*  --  73*  ALT 104*  --  101*  --  189*  --  156*  --  121*  ALKPHOS 190*  --  198*  --  264*  --  324*  --  319*  BILITOT 1.3*  --  1.6*  --  1.4*  --  1.9*  --  1.6*  PROT 7.5  --  8.7*  --  8.5*  --  8.3*  --  9.1*  ALBUMIN 1.5*   < > 1.7*   < > 1.7*  1.6* 1.7* 1.7*  1.7* 1.7* 1.8*  1.8*   < > = values in this interval not displayed.   Recent Labs  Lab 11/18/19 1101  LIPASE 83*   No results for input(s): AMMONIA in the last 168 hours.  ABG    Component Value Date/Time   PHART 7.375 11/20/2019 0626   PCO2ART 39.8 11/20/2019 0626   PO2ART 103 11/20/2019 0626   HCO3 23.4 11/20/2019 0626   TCO2 25 11/20/2019 0626   ACIDBASEDEF 2.0 11/20/2019 0626   O2SAT 53.1 11/22/2019 0317     Coagulation Profile: Recent Labs  Lab 11/18/19 1101  INR 2.6*    Cardiac Enzymes: Recent Labs  Lab 11/20/19 1522  CKTOTAL 795*    HbA1C: Hemoglobin A1C  Date/Time Value Ref Range Status  06/07/2019 10:54 AM 6.4 (A) 4.0 - 5.6 % Final   Hgb A1c MFr Bld  Date/Time Value Ref Range Status  10/27/2019 04:51 AM 6.2 (H) 4.8 - 5.6 %  Final    Comment:    (NOTE) Pre diabetes:          5.7%-6.4% Diabetes:              >6.4% Glycemic control for   <7.0% adults with diabetes   08/20/2018 07:23 AM 6.9 (H) 4.8 - 5.6 % Final    Comment:    (NOTE) Pre diabetes:          5.7%-6.4% Diabetes:              >6.4% Glycemic control for   <7.0% adults with  diabetes     CBG: Recent Labs  Lab 11/21/19 1519 11/21/19 2038 11/22/19 0003 11/22/19 0319 11/22/19 0745  GLUCAP 116* 219* 221* 166* 178*     This patient is critically ill with multiple organ system failure which requires frequent high complexity decision making, assessment, support, evaluation, and titration of therapies. This was completed through the application of advanced monitoring technologies and extensive interpretation of multiple databases. During this encounter critical care time was devoted to patient care services described in this note for 40 minutes.  Julian Hy, DO 11/22/19 9:33 AM [facility]

## 2019-11-23 ENCOUNTER — Other Ambulatory Visit: Payer: Self-pay | Admitting: *Deleted

## 2019-11-23 ENCOUNTER — Inpatient Hospital Stay (HOSPITAL_COMMUNITY): Payer: Medicare HMO

## 2019-11-23 DIAGNOSIS — J9601 Acute respiratory failure with hypoxia: Secondary | ICD-10-CM | POA: Diagnosis not present

## 2019-11-23 DIAGNOSIS — D649 Anemia, unspecified: Secondary | ICD-10-CM | POA: Diagnosis not present

## 2019-11-23 DIAGNOSIS — N186 End stage renal disease: Secondary | ICD-10-CM | POA: Diagnosis not present

## 2019-11-23 DIAGNOSIS — I5023 Acute on chronic systolic (congestive) heart failure: Secondary | ICD-10-CM

## 2019-11-23 DIAGNOSIS — I998 Other disorder of circulatory system: Secondary | ICD-10-CM

## 2019-11-23 DIAGNOSIS — D72829 Elevated white blood cell count, unspecified: Secondary | ICD-10-CM | POA: Diagnosis not present

## 2019-11-23 DIAGNOSIS — I469 Cardiac arrest, cause unspecified: Secondary | ICD-10-CM | POA: Diagnosis not present

## 2019-11-23 DIAGNOSIS — K567 Ileus, unspecified: Secondary | ICD-10-CM

## 2019-11-23 DIAGNOSIS — I214 Non-ST elevation (NSTEMI) myocardial infarction: Secondary | ICD-10-CM | POA: Diagnosis not present

## 2019-11-23 LAB — TYPE AND SCREEN
ABO/RH(D): O POS
Antibody Screen: POSITIVE
Unit division: 0
Unit division: 0
Unit division: 0

## 2019-11-23 LAB — RENAL FUNCTION PANEL
Albumin: 1.8 g/dL — ABNORMAL LOW (ref 3.5–5.0)
Albumin: 1.7 g/dL — ABNORMAL LOW (ref 3.5–5.0)
Anion gap: 11 (ref 5–15)
Anion gap: 9 (ref 5–15)
BUN: 31 mg/dL — ABNORMAL HIGH (ref 8–23)
BUN: 50 mg/dL — ABNORMAL HIGH (ref 8–23)
CO2: 25 mmol/L (ref 22–32)
CO2: 24 mmol/L (ref 22–32)
Calcium: 7.7 mg/dL — ABNORMAL LOW (ref 8.9–10.3)
Calcium: 7.1 mg/dL — ABNORMAL LOW (ref 8.9–10.3)
Chloride: 99 mmol/L (ref 98–111)
Chloride: 100 mmol/L (ref 98–111)
Creatinine, Ser: 1.69 mg/dL — ABNORMAL HIGH (ref 0.61–1.24)
Creatinine, Ser: 2.61 mg/dL — ABNORMAL HIGH (ref 0.61–1.24)
GFR calc Af Amer: 48 mL/min — ABNORMAL LOW (ref >60)
GFR calc Af Amer: 29 mL/min — ABNORMAL LOW (ref >60)
GFR calc non Af Amer: 42 mL/min — ABNORMAL LOW (ref >60)
GFR calc non Af Amer: 25 mL/min — ABNORMAL LOW (ref >60)
Glucose, Bld: 176 mg/dL — ABNORMAL HIGH (ref 70–99)
Glucose, Bld: 191 mg/dL — ABNORMAL HIGH (ref 70–99)
Phosphorus: 2.5 mg/dL (ref 2.5–4.6)
Phosphorus: 2.9 mg/dL (ref 2.5–4.6)
Potassium: 4.4 mmol/L (ref 3.5–5.1)
Potassium: 4.2 mmol/L (ref 3.5–5.1)
Sodium: 135 mmol/L (ref 135–145)
Sodium: 133 mmol/L — ABNORMAL LOW (ref 135–145)

## 2019-11-23 LAB — CBC
HCT: 29.9 % — ABNORMAL LOW (ref 39.0–52.0)
Hemoglobin: 8.8 g/dL — ABNORMAL LOW (ref 13.0–17.0)
MCH: 28.5 pg (ref 26.0–34.0)
MCHC: 29.4 g/dL — ABNORMAL LOW (ref 30.0–36.0)
MCV: 96.8 fL (ref 80.0–100.0)
Platelets: 267 10*3/uL (ref 150–400)
RBC: 3.09 MIL/uL — ABNORMAL LOW (ref 4.22–5.81)
RDW: 20.8 % — ABNORMAL HIGH (ref 11.5–15.5)
WBC: 28.1 10*3/uL — ABNORMAL HIGH (ref 4.0–10.5)
nRBC: 0.7 % — ABNORMAL HIGH (ref 0.0–0.2)

## 2019-11-23 LAB — DIFFERENTIAL
Abs Immature Granulocytes: 0.4 10*3/uL — ABNORMAL HIGH (ref 0.00–0.07)
Basophils Absolute: 0.1 10*3/uL (ref 0.0–0.1)
Basophils Relative: 0 %
Eosinophils Absolute: 0.4 10*3/uL (ref 0.0–0.5)
Eosinophils Relative: 2 %
Immature Granulocytes: 1 %
Lymphocytes Relative: 5 %
Lymphs Abs: 1.4 10*3/uL (ref 0.7–4.0)
Monocytes Absolute: 1.6 10*3/uL — ABNORMAL HIGH (ref 0.1–1.0)
Monocytes Relative: 6 %
Neutro Abs: 23.8 10*3/uL — ABNORMAL HIGH (ref 1.7–7.7)
Neutrophils Relative %: 86 %

## 2019-11-23 LAB — BPAM RBC
Blood Product Expiration Date: 202105112359
Blood Product Expiration Date: 202105182359
Blood Product Expiration Date: 202105192359
ISSUE DATE / TIME: 202104242004
Unit Type and Rh: 5100
Unit Type and Rh: 9500
Unit Type and Rh: 9500

## 2019-11-23 LAB — LIPASE, BLOOD: Lipase: 28 U/L (ref 11–51)

## 2019-11-23 LAB — GLUCOSE, CAPILLARY
Glucose-Capillary: 132 mg/dL — ABNORMAL HIGH (ref 70–99)
Glucose-Capillary: 158 mg/dL — ABNORMAL HIGH (ref 70–99)
Glucose-Capillary: 225 mg/dL — ABNORMAL HIGH (ref 70–99)
Glucose-Capillary: 311 mg/dL — ABNORMAL HIGH (ref 70–99)
Glucose-Capillary: 157 mg/dL — ABNORMAL HIGH (ref 70–99)

## 2019-11-23 LAB — HEPATIC FUNCTION PANEL
ALT: 81 U/L — ABNORMAL HIGH (ref 0–44)
AST: 44 U/L — ABNORMAL HIGH (ref 15–41)
Albumin: 1.7 g/dL — ABNORMAL LOW (ref 3.5–5.0)
Alkaline Phosphatase: 253 U/L — ABNORMAL HIGH (ref 38–126)
Bilirubin, Direct: 0.9 mg/dL — ABNORMAL HIGH (ref 0.0–0.2)
Indirect Bilirubin: 0.9 mg/dL (ref 0.3–0.9)
Total Bilirubin: 1.8 mg/dL — ABNORMAL HIGH (ref 0.3–1.2)
Total Protein: 8.1 g/dL (ref 6.5–8.1)

## 2019-11-23 LAB — HEPARIN LEVEL (UNFRACTIONATED): Heparin Unfractionated: 0.46 IU/mL (ref 0.30–0.70)

## 2019-11-23 LAB — COOXEMETRY PANEL
Carboxyhemoglobin: 1.5 % (ref 0.5–1.5)
Methemoglobin: 1.3 % (ref 0.0–1.5)
O2 Saturation: 52.8 %
Total hemoglobin: 11.3 g/dL — ABNORMAL LOW (ref 12.0–16.0)

## 2019-11-23 LAB — APTT: aPTT: 91 seconds — ABNORMAL HIGH (ref 24–36)

## 2019-11-23 LAB — MAGNESIUM: Magnesium: 2.4 mg/dL (ref 1.7–2.4)

## 2019-11-23 MED ORDER — APIXABAN 5 MG PO TABS
5.0000 mg | ORAL_TABLET | Freq: Two times a day (BID) | ORAL | Status: DC
  Administered 2019-11-23 – 2019-12-06 (×27): 5 mg via ORAL
  Filled 2019-11-23 (×29): qty 1

## 2019-11-23 MED ORDER — SODIUM PHOSPHATES 45 MMOLE/15ML IV SOLN
10.0000 mmol | Freq: Once | INTRAVENOUS | Status: AC
  Administered 2019-11-23: 10 mmol via INTRAVENOUS
  Filled 2019-11-23: qty 3.33

## 2019-11-23 NOTE — Progress Notes (Signed)
Status:  Patient rested in between cares overnight.  VS WDL with levophed infusing at 14 mcg and vasopressin at 0.3. CRRT continues with goal of -50 an hour.  CVP continues to be low at 2-5 through night.  BP's this AM are on the softer side with MAP's just above or around 65 on pressors.  CRRT goal reduced to -10 an hour until rounding team adjusts and makes decisions based on low CVP and increased need for pressors.  Weight is down 3 kg this AM.

## 2019-11-23 NOTE — Evaluation (Signed)
Clinical/Bedside Swallow Evaluation Patient Details  Name: Patrick [name] MRN: [id number] Date of Birth: 05/29/1956  Today's Date: 11/23/2019 Time: SLP Start Time (ACUTE ONLY): 1030 SLP Stop Time (ACUTE ONLY): 1100 SLP Time Calculation (min) (ACUTE ONLY): 30 min  Past Medical History:  Past Medical History:  Diagnosis Date  . Arthritis   . Atrial flutter ([place])    Ablation 2018  . 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 Erectile dysfunction   . ESRD (end stage renal disease) on dialysis ([place])    Pt on HD 2003 >> transplanted in 2009, back on HD in 2016. [name].   . Essential hypertension   . Gout   . Hernia of abdominal wall   . History of blood transfusion   . History of cardiomyopathy   . History of kidney stones   . History of pneumonia   . Migraine   . Myocardial infarction ([place])    1996  . Pneumonia   . Secondary hyperparathyroidism ([place])   . Type 2 diabetes mellitus ([place])   . Wears glasses    Past Surgical History:  Past Surgical History:  Procedure Laterality Date  . A-FLUTTER ABLATION N/A 09/24/2016   Procedure: A-Flutter Ablation;  Surgeon: Will Meredith Leeds, MD;  Location: [place] [facility];  Service: Cardiovascular;  Laterality: N/A;  . ABDOMINAL AORTOGRAM W/LOWER EXTREMITY N/A 04/13/2017   Procedure: ABDOMINAL AORTOGRAM W/LOWER EXTREMITY;  Surgeon: Conrad Solana, MD;  Location: [place] [facility];  Service: Cardiovascular;  Laterality: N/A;  Bilater lower extermity  . AORTIC VALVE REPLACEMENT N/A 10/27/2019   Procedure: AORTIC VALVE REPLACEMENT (AVR) USING EDWARDS INTUITY 23 MM AORTIC VALVE.;  Surgeon: Wonda Olds, MD;  Location: [place];  Service: Open Heart Surgery;  Laterality: N/A;  . APPENDECTOMY    . AV FISTULA PLACEMENT Right 09/18/2014   Procedure: INSERTION OF ARTERIOVENOUS  (AV) GORE-TEX GRAFT ARM USING 4-7MM  X 45CM STRETCH GORE-TEX VASCULAR GRAFT;  Surgeon: Rosetta Posner, MD;  Location: [place];  Service: Vascular;  Laterality: Right;  . AV FISTULA PLACEMENT Left 07/07/2016   Procedure: INSERTION OF LEFT BRACHIAL TO AXILLARY ARTERIOVENOUS (AV) GORE-TEX ARM GRAFT;  Surgeon: Conrad [place], MD;  Location: [place];  Service: Vascular;  Laterality: Left;  . AV FISTULA PLACEMENT Left 12/09/2018   Procedure: Insertion Of Arteriovenous (Av) Gore-Tex Graft Arm, left arm;  Surgeon: Marty Heck, MD;  Location: [place];  Service: Vascular;  Laterality: Left;  . CARDIAC CATHETERIZATION  ~ 2016  . COLONOSCOPY    . 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 CORONARY ARTERY BYPASS GRAFT N/A 10/27/2019   Procedure: REDO CORONARY ARTERY BYPASS GRAFTING TIMES 3  USING LEFT GREATER SAPHENOUS LEG VEIN HARVESTED ENDOSCOPICALLY(CABG);  Surgeon: Wonda Olds, MD;  Location: [place];  Service: Open Heart Surgery;  Laterality: N/A;  . CORONARY ATHERECTOMY N/A 02/16/2019   Procedure: CORONARY ATHERECTOMY;  Surgeon: Martinique, Peter M, MD;  Location: [place] [facility];  Service: Cardiovascular;  Laterality: N/A;  . ENDOVEIN HARVEST OF GREATER SAPHENOUS VEIN Left 10/27/2019   Procedure: [place] Of Greater Saphenous Vein;  Surgeon: Wonda Olds, MD;  Location: [place];  Service: Open Heart Surgery;  Laterality: Left;  . EXCHANGE OF  A DIALYSIS CATHETER Left 01/11/2018   Procedure: EXCHANGE OF TUNNELED DIALYSIS CATHETER;  Surgeon: Rosetta Posner, MD;  Location: [place];  Service: Vascular;  Laterality: Left;  . HERNIA REPAIR  2017   with nephrectomy  . INSERTION OF DIALYSIS CATHETER N/A 10/08/2017   Procedure: INSERTION OF TUNNELED DIALYSIS CATHETER;  Surgeon: Conrad Christiana, MD;  Location: [place];  Service: Vascular;  Laterality: N/A;  . IR FLUORO GUIDE CV LINE LEFT  03/12/2018  . IR FLUORO GUIDE CV LINE RIGHT   10/31/2019  . IR GENERIC HISTORICAL  05/11/2016   IR FLUORO GUIDE CV LINE LEFT 05/11/2016 Corrie Mckusick, DO [facility]  . IR GENERIC HISTORICAL  05/11/2016   IR US GUIDE VASC ACCESS LEFT 05/11/2016 Corrie Mckusick, DO [facility]  . IR GENERIC HISTORICAL  05/11/2016   IR US GUIDE VASC ACCESS RIGHT 05/11/2016 Corrie Mckusick, DO [facility]  . IR GENERIC HISTORICAL  05/11/2016   IR RADIOLOGY PERIPHERAL GUIDED IV START 05/11/2016 Corrie Mckusick, DO [facility]  . IR PTA VENOUS EXCEPT DIALYSIS CIRCUIT  10/31/2019  . IR US GUIDE VASC ACCESS RIGHT  10/31/2019  . KIDNEY TRANSPLANT  2009  . LEFT HEART CATH AND CORONARY ANGIOGRAPHY N/A 02/15/2019   Procedure: LEFT HEART CATH AND CORONARY ANGIOGRAPHY;  Surgeon: Martinique, Peter M, MD;  Location: [place] [facility];  Service: Cardiovascular;  Laterality: N/A;  . LEFT HEART CATH AND CORS/GRAFTS ANGIOGRAPHY N/A 12/04/2017   Procedure: LEFT HEART CATH AND CORS/GRAFTS ANGIOGRAPHY;  Surgeon: Belva Crome, MD;  Location: [place] [facility];  Service: Cardiovascular;  Laterality: N/A;  . LEFT HEART CATH AND CORS/GRAFTS ANGIOGRAPHY N/A 10/25/2019   Procedure: LEFT HEART CATH AND CORS/GRAFTS ANGIOGRAPHY;  Surgeon: Martinique, Peter M, MD;  Location: [place] [facility];  Service: Cardiovascular;  Laterality: N/A;  . NEPHRECTOMY  2017   transplant rejected   . PERIPHERAL VASCULAR CATHETERIZATION N/A 06/04/2016   Procedure: Upper Extremity Venography;  Surgeon: Waynetta Sandy, MD;  Location: [place] [facility];  Service: Cardiovascular;  Laterality: N/A;  . PERIPHERAL VASCULAR INTERVENTION  04/13/2017   Procedure: PERIPHERAL VASCULAR INTERVENTION;  Surgeon: Conrad Herrin, MD;  Location: [place] [facility];  Service: Cardiovascular;;  Lt. Common/Exernal  Iliac  . TEE WITHOUT CARDIOVERSION N/A 10/27/2019   Procedure: TRANSESOPHAGEAL ECHOCARDIOGRAM (TEE);  Surgeon: Wonda Olds, MD;  Location: [place];  Service: Open Heart Surgery;  Laterality: N/A;  . THROMBECTOMY AND  REVISION OF ARTERIOVENTOUS (AV) GORETEX  GRAFT Left 10/08/2017   Procedure: THROMBECTOMY of ARTERIOVENTOUS (AV) GORETEX  GRAFT LEFT UPPER ARM;  Surgeon: Conrad Rockwell, MD;  Location: [place];  Service: Vascular;  Laterality: Left;  . THROMBECTOMY W/ EMBOLECTOMY Left 09/14/2017   Procedure: THROMBECTOMY ARTERIOVENOUS GORE-TEX GRAFT LEFT UPPER ARM;  Surgeon: Rosetta Posner, MD;  Location: [place];  Service: Vascular;  Laterality: Left;  . UPPER EXTREMITY ANGIOGRAPHY Bilateral 10/29/2018   Procedure: UPPER EXTREMITY ANGIOGRAPHY;  Surgeon: Marty Heck, MD;  Location: [place] [facility];  Service: Cardiovascular;  Laterality: Bilateral;  . UPPER EXTREMITY VENOGRAPHY N/A 11/16/2017   Procedure: UPPER EXTREMITY VENOGRAPHY - Right Arm;  Surgeon: Conrad Elliott, MD;  Location: [place] [facility];  Service: Cardiovascular;  Laterality: N/A;  . UPPER EXTREMITY VENOGRAPHY Bilateral 10/29/2018   Procedure: UPPER EXTREMITY VENOGRAPHY;  Surgeon: Marty Heck, MD;  Location: [place] [facility];  Service: Cardiovascular;  Laterality: Bilateral;   HPI:  64 year old male with PMH  of CABG in 1997 s/p stent, ESRD on HD s/p renal transplant, presented [facility] with CP, SOB. Dx NSTEMI and multivessel CAD. Underwent 3vCABG and AVR 4/1 and balloon pump placement. Intubated 4/1 for surgery, extubated 4/4. PEA 4/23 and reintubated for4 days.   Assessment / Plan / Recommendation Clinical Impression  ST reconsulted following Respiratory arrest/PEA with reintubation 4/23-27/21. Pt currently NPO with Cortrak. Generalized weakness with aphonic voice quality and weak volitional cough. CN exam unremarkable. Oral care was completed with suction. Oral cavity pink, moist, and healthy with adequate natural dentition. Pt accepted trials of ice chips x2 with appropriate oral prep and no overt s/s aspiration. Pt declined water and puree. Given significant weakness, current nutritional support with cortrak and second intubation this  month, recommend continuing with NPO status and ice chips in moderation after oral care. SLP will continue to follow to assess readiness for po intake. RN aware.   SLP Visit Diagnosis: Dysphagia, unspecified (R13.10)    Aspiration Risk  Severe aspiration risk    Diet Recommendation NPO;Ice chips PRN after oral care   Liquid Administration via: Spoon Medication Administration: Via alternative means Supervision: Full supervision/cueing for compensatory strategies Compensations: Slow rate Postural Changes: Seated upright at 90 degrees    Other  Recommendations Oral Care Recommendations: Oral care prior to ice chip/H20   Follow up Recommendations Inpatient Rehab      Frequency and Duration min 2x/week  2 weeks       Prognosis Prognosis for Safe Diet Advancement: Good      Swallow Study   General Date of Onset: 10/27/19 HPI: 64 year old male with PMH of CABG in 1997 s/p stent, ESRD on HD s/p renal transplant, [facility] to [facility] with CP, SOB. Dx NSTEMI and multivessel CAD. Underwent 3vCABG and AVR 4/1 and balloon pump placement. Intubated 4/1 for surgery, extubated 4/4. PEA 4/23 and reintubated for4 days. Type of Study: Bedside Swallow Evaluation Previous Swallow Assessment: BSE 10/30/19 - advanced to reg/thin and DC from ST 4/13 Diet Prior to this Study: NG Tube;NPO Temperature Spikes Noted: No Respiratory Status: Nasal cannula History of Recent Intubation: Yes Length of Intubations (days): 4 days Date extubated: 11/22/19 Behavior/Cognition: Lethargic/Drowsy Oral Cavity Assessment: Within Functional Limits Oral Care Completed by SLP: Yes Oral Cavity - Dentition: Adequate natural dentition Vision: Functional for self-feeding Self-Feeding Abilities: Total assist Patient Positioning: Upright in bed Baseline Vocal Quality: Aphonic Volitional Cough: Weak Volitional Swallow: Able to elicit    Oral/Motor/Sensory Function Overall Oral Motor/Sensory Function: Generalized oral  weakness   Ice Chips Ice chips: Within functional limits Presentation: Spoon   Thin Liquid Thin Liquid: Not tested    Nectar Thick Nectar Thick Liquid: Not tested   Honey Thick Honey Thick Liquid: Not tested   Puree Puree: Not tested   Solid     Solid: Not tested     Morrissa Shein B. Quentin Ore, [facility], [place] Speech Language Pathologist Office: [phone] Pager: [phone]  Shonna Chock 11/23/2019,11:13 AM

## 2019-11-23 NOTE — Progress Notes (Signed)
Patient ID: Patrick [name], male   DOB: 09-23-1955, 64 y.o.   MRN: [id number] TCTS Evening Rounds:  SBP 90's on NE 22, Vaso 0.03    NE increased from 46mcg this morning.  sats 100%  BMET    Component Value Date/Time   NA 133 (L) 11/23/2019 1624   NA 139 11/30/2017 1008   K 4.2 11/23/2019 1624   CL 100 11/23/2019 1624   CO2 24 11/23/2019 1624   GLUCOSE 191 (H) 11/23/2019 1624   BUN 50 (H) 11/23/2019 1624   BUN 48 (H) 11/30/2017 1008   CREATININE 2.61 (H) 11/23/2019 1624   CALCIUM 7.1 (L) 11/23/2019 1624   GFRNONAA 25 (L) 11/23/2019 1624   GFRAA 29 (L) 11/23/2019 1624   Remains anuric. CRRT stopped per nephrology.

## 2019-11-23 NOTE — Progress Notes (Signed)
Patient ID: Patrick [name], male   DOB: 1956/01/20, 64 y.o.   MRN: [id number]     Advanced Heart Failure Rounding Note  PCP-Cardiologist: Dorris Carnes, MD     Patient Profile   64 y/o male w/ h/o CAD s/p remote CABG in 1997 w/ LIMA-LAD, failed kidney transplant, end-stage renal disease on dialysis admitted w/ CP. LHC w/ severe CAD. Echo w/ reduced EF 35-40% and moderate AI. S/p redo CABG x 3 on 4/1 (SVG-LAD, SVG-PDA, SVG-OM) + bioprosthetic AVR. Post operative course c/b by cardiogenic shock, respiratory failure, VT, and protein malnutrition.    Subjective:    Patient had respiratory arrest with PEA while in CT scanner 4/23, had 5 minutes CPR with drugs, achieved ROSC and was intubated.  K noted > 7.  He was extubated on 4/27.   No complaints this morning, breathing ok.  Has not been out of bed.   CVVH stopped this morning, CVP 2-3 with co-ox 53%.   On NE 14 mcg, VP 0.03.    Objective:   Weight Range: 83.9 kg Body mass index is 29.41 kg/m.   Vital Signs:   Temp:  [97.2 F (36.2 C)-98.3 F (36.8 C)] 97.2 F (36.2 C) (04/28 0800) Pulse Rate:  [25-112] 92 (04/28 0930) Resp:  [14-32] 20 (04/28 0930) BP: (75-172)/(49-148) 96/49 (04/28 0930) SpO2:  [82 %-100 %] 100 % (04/28 0930) Arterial Line BP: (85-149)/(58-143) 95/90 (04/27 1745) FiO2 (%):  [40 %] 40 % (04/28 0000) Weight:  [83.9 kg-86.9 kg] 83.9 kg (04/28 0348) Last BM Date: 11/23/19  Weight change: Filed Weights   11/22/19 0500 11/22/19 1618 11/23/19 0348  Weight: 86.9 kg 86.9 kg 83.9 kg    Intake/Output:   Intake/Output Summary (Last 24 hours) at 11/23/2019 0948 Last data filed at 11/23/2019 0800 Gross per 24 hour  Intake 1016.05 ml  Output 2118 ml  Net -1101.95 ml      Physical Exam   CVP 2-3  General: NAD Neck: No JVD, no thyromegaly or thyroid nodule.  Lungs: Clear to auscultation bilaterally with normal respiratory effort. CV: Nondisplaced PMI.  Heart regular S1/S2, no S3/S4, 2/6 early SEM RUSB.  No  peripheral edema.   Abdomen: Soft, nontender, no hepatosplenomegaly, no distention.  Skin: Intact without lesions or rashes.  Neurologic: Alert and oriented x 3.  Psych: Normal affect. Extremities: No clubbing or cyanosis.  HEENT: Normal.    Telemetry   A paced 90s   Labs    CBC Recent Labs    11/22/19 0317 11/23/19 0441  WBC 23.0* 28.1*  HGB 8.7* 8.8*  HCT 29.2* 29.9*  MCV 97.0 96.8  PLT 267 753   Basic Metabolic Panel Recent Labs    11/22/19 0317 11/22/19 0317 11/22/19 1600 11/23/19 0441  NA 133*   < > 134* 135  K 4.3   < > 4.2 4.4  CL 100   < > 100 99  CO2 24   < > 27 25  GLUCOSE 175*   < > 149* 176*  BUN 33*   < > 30* 31*  CREATININE 1.89*   < > 1.73* 1.69*  CALCIUM 7.4*   < > 7.4* 7.7*  MG 2.4  --   --  2.4  PHOS 2.0*   < > 1.9* 2.5   < > = values in this interval not displayed.   Liver Function Tests Recent Labs    11/21/19 0410 11/21/19 1833 11/22/19 0317 11/22/19 0317 11/22/19 1600 11/23/19 0441  AST 131*  --  73*  --   --   --   ALT 156*  --  121*  --   --   --   ALKPHOS 324*  --  319*  --   --   --   BILITOT 1.9*  --  1.6*  --   --   --   PROT 8.3*  --  9.1*  --   --   --   ALBUMIN 1.7*  1.7*   < > 1.8*  1.8*   < > 1.8* 1.8*   < > = values in this interval not displayed.   No results for input(s): LIPASE, AMYLASE in the last 72 hours. Cardiac Enzymes Recent Labs    11/20/19 1522  CKTOTAL 795*    BNP: BNP (last 3 results) Recent Labs    11/20/19 0353 11/21/19 0410  BNP 374.6* 519.5*    ProBNP (last 3 results) No results for input(s): PROBNP in the last 8760 hours.   D-Dimer No results for input(s): DDIMER in the last 72 hours. Hemoglobin A1C No results for input(s): HGBA1C in the last 72 hours. Fasting Lipid Panel No results for input(s): CHOL, HDL, LDLCALC, TRIG, CHOLHDL, LDLDIRECT in the last 72 hours. Thyroid Function Tests No results for input(s): TSH, T4TOTAL, T3FREE, THYROIDAB in the last 72 hours.  Invalid  input(s): FREET3  Other results:   Imaging    No results found.   Medications:     Scheduled Medications: . amiodarone  200 mg Per Tube BID  . chlorhexidine gluconate (MEDLINE KIT)  15 mL Mouth Rinse BID  . Chlorhexidine Gluconate Cloth  6 each Topical Daily  . clopidogrel  75 mg Per Tube Daily  . darbepoetin (ARANESP) injection - NON-DIALYSIS  60 mcg Subcutaneous Q Sat-1800  . docusate  100 mg Per Tube BID  . feeding supplement (PRO-STAT SUGAR FREE 64)  60 mL Per Tube BID  . insulin aspart  0-9 Units Subcutaneous Q4H  . insulin glargine  6 Units Subcutaneous Daily  . mouth rinse  15 mL Mouth Rinse 10 times per day  . midodrine  15 mg Per Tube TID WC  . multivitamin  1 tablet Per Tube QHS  . pantoprazole sodium  40 mg Per Tube Daily  . rosuvastatin  10 mg Per Tube q1800  . sodium chloride flush  10-40 mL Intracatheter Q12H    Infusions: . sodium chloride Stopped (11/21/19 1749)  . sodium chloride    . sodium chloride    . feeding supplement (VITAL 1.5 CAL) Stopped (11/22/19 1457)  . fentaNYL infusion INTRAVENOUS Stopped (11/22/19 0948)  . heparin 1,250 Units/hr (11/23/19 0802)  . norepinephrine (LEVOPHED) Adult infusion 14 mcg/min (11/23/19 0700)  . sodium phosphate  Dextrose 5% IVPB 10 mmol (11/23/19 0828)  . vasopressin (PITRESSIN) infusion - *FOR SHOCK* 0.03 Units/min (11/23/19 0700)    PRN Medications: sodium chloride, Place/Maintain arterial line **AND** sodium chloride, Place/Maintain arterial line **AND** sodium chloride, acetaminophen (TYLENOL) oral liquid 160 mg/5 mL, alteplase, dextrose, diphenhydrAMINE, fentaNYL, fentaNYL (SUBLIMAZE) injection, Gerhardt's butt cream, heparin, heparin, levalbuterol, lip balm, metoprolol tartrate, midazolam, ondansetron (ZOFRAN) IV, oxyCODONE, phenol, sodium chloride, sodium chloride flush   Assessment/Plan   1. Cardiogenic shock: Post-op.  Echo 4/14 with EF 30-35%, stable aortic valve replacement.  Echo on 4/21 with EF  45-50% and significant respirophasic septal variation.  Pericardium in some places appears thickened though windows are poor.  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 CT chest 4/23, difficult images as the patient  became unstable during the scan. There did not appear to be significant pericardial pathology. S/p PEA arrest 4/23. Now on NE 14, VP 0.03, midodrine 15 tid.  CVP 2-3, dry.  - Stopping CVVH.  Hopefully can wean NE off CVVH.   2. ESRD: continue CVVHD as above. He is now dry, stopping CVVH.  Resume iHD when needed. 3. Possible aspiration PNA: He has completed empiric vanco/meropenem.  4. Atrial fibrillation: Paroxysmal.  Now a-paced at 90.  - Continue amiodarone 200 mg bid for now. - Restart apixaban 5 mg bid.  5. Anemia, post-op: Hgb 8.8 today.  6. CAD: Redo CABG x 3 this admission on 4/1 (SVG-LAD, SVG-PDA, SVG-OM).   - Continue Plavix and Crestor.  7. VT:  Frequent NSVT, now on Amiodarone with improvement. 8 Acute hypoxic respiratory failure: due to pulmonary edema and aspiration PNA.  Re-intubated after PEA arrest 4/23. Extubated again on 4/27.  Has pleural effusions but did not appear amenable to thoracentesis per CCM.  9. ID: Afebrile.  Cultures NGTD. C diff negative. Sputum GS 4/23. Rare GNR.  10. PEA arrest on 4/23.  Resuscitated with CPR.  Suspect due to hypoxemia.  11. Elevated LFTs: ?Shock liver.  RUQ US unremarkable.  - Trending down.    Length of Stay: Craig, MD  11/23/2019, 9:48 AM  Advanced Heart Failure Team Pager [phone] (M-F; 7a - 4p)

## 2019-11-23 NOTE — Progress Notes (Signed)
Patient ID: Patrick [name], male   DOB: Nov 18, 1955, 64 y.o.   MRN: [id number] [facility] Progress Note   Assessment/ Plan:   1.  Redo coronary artery bypass grafting complicated by cardiogenic shock: Patient with history of coronary artery disease/NSTEMI.  He remains on pressors and was extubated yesterday and currently on NIPPV.  Based on CVP/volume assessment, appears to have established his new dry weight; I will discontinue CRRT at this time (especially with inability to escalate ultrafiltration). 2. ESRD: On CRRT at this time due to hemodynamic instability; we have accomplished the goal of ultrafiltration with a permissive CVP at this time that would allow discontinuation of CRRT at this time.  He remains on pressors and is on midodrine 15 mg 3 times daily-we will continue to follow him closely to determine safety/ability to undertake intermittent hemodialysis. 3. Anemia: Gradual uptrend of hemoglobin and hematocrit noted which indeed might be reflective of hemoconcentration with successful ultrafiltration, no overt loss with anticipated postoperative anemia. 4. CKD-MBD: Low phosphorus level secondary to CRRT losses, will give phosphorus supplement as hypophosphatemia may further compromise respiratory status. 5. Nutrition: Currently on NIPPV/n.p.o.; on tube feeds and oral nutritional supplementation. 6.  Atrial fibrillation: With controlled ventricular response, remains on pressors.  Subjective:   CVP 2-5 overnight prompting decreased ultrafiltration goal; currently UF net 10 cc an hour with continued pressor requirements.   Objective:   BP (!) 88/57   Pulse (!) 102   Temp 97.6 F (36.4 C) (Axillary)   Resp 20   Ht 5' 6.5" (1.689 m)   Wt 83.9 kg   SpO2 98%   BMI 29.41 kg/m   Physical Exam: Gen: Appears to be comfortable resting in bed, on BiPAP CVS: Pulse regular rhythm, normal rate, S1 and S2 normal.  Intact sternotomy scar/staples Resp: Anteriorly clear to  auscultation, no rales/rhonchi Abd: Soft, obese, nontender Ext: No lower extremity edema.  Labs: BMET Recent Labs  Lab 11/20/19 0353 11/20/19 0353 11/20/19 0626 11/20/19 1522 11/21/19 0410 11/21/19 1833 11/22/19 0317 11/22/19 1600 11/23/19 0441  NA 134*   < > 139 134* 133* 130* 133* 134* 135  K 4.8   < > 4.5 5.0 4.6 4.6 4.3 4.2 4.4  CL 102  --   --  101 100 98 100 100 99  CO2 26  --   --  27 27 24 24 27 25   GLUCOSE 251*  --   --  199* 239* 217* 175* 149* 176*  BUN 36*  --   --  33* 31* 32* 33* 30* 31*  CREATININE 2.31*  --   --  2.00* 1.96* 2.01* 1.89* 1.73* 1.69*  CALCIUM 7.2*  --   --  7.2* 7.2* 7.1* 7.4* 7.4* 7.7*  PHOS 2.4*  --   --  2.4* 1.9* 1.9* 2.0* 1.9* 2.5   < > = values in this interval not displayed.   CBC Recent Labs  Lab 11/20/19 0353 11/20/19 0353 11/20/19 0626 11/21/19 0410 11/22/19 0317 11/23/19 0441  WBC 13.4*  --   --  17.1* 23.0* 28.1*  HGB 8.1*   < > 8.5* 8.4* 8.7* 8.8*  HCT 26.9*   < > 25.0* 27.9* 29.2* 29.9*  MCV 96.1  --   --  96.9 97.0 96.8  PLT 213  --   --  233 267 267   < > = values in this interval not displayed.     Medications:    . amiodarone  200 mg Per Tube BID  .  chlorhexidine gluconate (MEDLINE KIT)  15 mL Mouth Rinse BID  . Chlorhexidine Gluconate Cloth  6 each Topical Daily  . clopidogrel  75 mg Per Tube Daily  . darbepoetin (ARANESP) injection - NON-DIALYSIS  60 mcg Subcutaneous Q Sat-1800  . docusate  100 mg Per Tube BID  . feeding supplement (PRO-STAT SUGAR FREE 64)  60 mL Per Tube BID  . insulin aspart  0-9 Units Subcutaneous Q4H  . insulin glargine  6 Units Subcutaneous Daily  . mouth rinse  15 mL Mouth Rinse 10 times per day  . midodrine  15 mg Per Tube TID WC  . multivitamin  1 tablet Per Tube QHS  . pantoprazole sodium  40 mg Per Tube Daily  . rosuvastatin  10 mg Per Tube q1800  . sodium chloride flush  10-40 mL Intracatheter Q12H   Elmarie Shiley, MD 11/23/2019, 7:22 AM

## 2019-11-23 NOTE — Progress Notes (Signed)
NAME:  Patrick [name], MRN:  [id number], DOB:  [birth year], LOS: 63 ADMISSION DATE:  10/24/2019, CONSULTATION DATE:  11/17/2020 REFERRING MD:  Kirk Ruths, CHIEF COMPLAINT:  Respiratory Arrest 4/23   Brief History   64 y/o male w/ h/o CAD, HTN, HLD, DM, ESRD.CABG x 1 1997, failed kidney transplant,end-stage renal disease on dialysis admitted 3/29  w/ CP, NSTEMI . He underwent  re-do CABG x 3  on 4/1. Post operative course c/b by cardiogenic shock, respiratory failure, VT,illius, and protein malnutrition. Pt. Had respiratory /  PEA arrest in CT on 4/23 requiring intubation. PCCM have been asked to consult  for respiratory failure/ ventilation management post arrest.  Past Medical History  CAD, HTN, HLD, DM, ESRD.  NSTEMI 10/25/2019 10/25/2019: LHC w/ severe CAD. Echo w/ reduced EF 35-40% and moderate AI. CABG Re-Do x 3 on 10/27/2019(SVG-LAD, SVG-PDA, SVG-OM).    RCA PCI 01/2019 Atrial Flutter s/p ablation 2018 CABG 1997 w / LIMA-LAD Combined systolic/ Diastolic Heart Failure Failed kidney transplant,end-stage renal disease on dialysis   Significant Hospital Events   3/29 Admission 4/1 CABG x 3 ( Re-do) 4/23 Respiratory / PEA  Arrest  4/23 Intubation and PCCM consult 4/27 extubated   Consults:  3/29 Renal Consult 3/30 TCTS Consult 4/23 PCCM  Procedures:  4/1 CABG x 3 (SVG-LAD, SVG-PDA, SVG-OM).  Significant Diagnostic Tests:  11/19/2019 [facility]  IMPRESSION: 1. Moderate to large bilateral pleural effusions are identified. The left pleural effusion appears partially loculated with loculated fluid along the paramediastinal left upper lobe. 2. There is compressive type atelectasis and consolidation associated with bilateral pleural effusions with considerable reduction in aerated lung volumes bilaterally. 3. Aortic atherosclerosis. IMPRESSION: Difficult images. There does not appear to be significant pericardial pathology.   11/18/2019 CXR Intubated, endotracheal tube tip  in good position. Otherwise stable lines and tubes. Improved lung volumes since 0907 hours today.  Small bilateral pleural effusions with compressive lung base atelectasis better demonstrated on the CT images today 1045 hours.  Echo 4/14 with EF 30-35%, stable aortic valve replacement.    Echo 4/21 with EF 45-50% and significant respirophasic septal variation.  Pericardium in some places appears thickened though windows are poor.  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  Cath 10/25/2019 . Severe 3 vessel CAD    - CTO of the mid LAD after the first diagonal    - 90% ostial LCx    - 100% mid RCA in stent. Left to right collaterals. 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 Atretic LIMA graft Mildly elevated LVEDP Early restenosis/occlusion of the RCA. This vessel is diffusely diseased and heavily calcified. The ostial LCx appears new compared to prior.  Percutaneous options are very limited Micro Data:  4/28 Bcx >>  4/23 respiratory>> normal flora 4/22 blood>> NG 4/14 Blood>> No growth 4/11 Blood >> No Growth 4/8  Respiratory>> Normal Flora 4/8/ Blood >> No Growth 3/29 SARS Coronavirus 2 >> Negative Antimicrobials:  Vanc 4/11-4/17 Meropenem 4/11-4/17  Interim history/subjective:  Vomited once overnight. Doing well on 5L [place] Complaining of abdominal pain   Objective   Blood pressure 104/64, pulse 72, temperature (!) 97.2 F (36.2 C), temperature source Axillary, resp. rate (!) 22, height 5' 6.5" (1.689 m), weight 83.9 kg, SpO2 99 %. CVP:  [0 mmHg-58 mmHg] 2 mmHg  Vent Mode: BIPAP;PCV FiO2 (%):  [40 %] 40 % Set Rate:  [12 bmp] 12 bmp PEEP:  [7 cmH20] 7 cmH20 Plateau Pressure:  [13  cmH20] 13 cmH20   Intake/Output Summary (Last 24 hours) at 11/23/2019 1050 Last data filed at 11/23/2019 0800 Gross per 24 hour  Intake 973.68 ml  Output 1984 ml  Net -1010.32 ml   Filed Weights   11/22/19 0500 11/22/19 1618  11/23/19 0348  Weight: 86.9 kg 86.9 kg 83.9 kg    Examination: General:frail male, tired-appearing, in NAD  HENT: NCAT, PERRL, anicteric sclera, OP clear, dry MM  lungs: bilateral ronchi, mildly tachypneic but oxygenating well  Cardiovascular: RRR, paced  Abdomen: soft, diffusely tender, normoactive bowel sounds  Extremities: no pitting edema  Neuro: awake, answering questions appropriately, follows commands  Skin: Warm, chest incision healing well, abd wall incisions appear thick but not warmth or erythematous   Resolved Hospital Problem list   Diarrhea   Assessment & Plan:  Acute Hypoxic Respiratory Failure / PEA Arrest while in CT 4/23 - appears to be multifactorial from pulmonary edema, bilateral pleural effusions (possibly loculated), suspected aspiration, undiagnosed OSA/OHS, and poor respiratory mechanics.  Left pleural effusion minimal on bedside ultrasound 4/26. - Extubated 4/27, doing well on 5L [place]. Will continue to O2 requirements as able  - Needs nocturnal positive pressure ventilation given concern for OSA.  Will need outpatient evaluation and PSG as well. - Pulmonary toilet - Maintain euvolemia  Elevated LFTs-improving.  No evidence of gallbladder pathology. - Continue rosuvastatin and amiodarone for now.  - Continue to monitor, follow up HFT today   Post-arrest leukocytosis -cont' to worsen.  No obvious source of infection.  No fevers. Previous cx data negative.  - Bcx ordered today  - Bladder scan   Abdominal pain - diffuse  - KUB ordered, will follow - Lipase     ESRD with post-arrest hyperkalemia  - Appreciate nephrology's input, transitioning to iHD  - Renally dose medications - Strict I/O  Hyperglycemia, improved  - Goal BG 140-180  - Continue sliding scale insulin  Hypotension post arrest Cardiogenic shock - Continue norepinephrine and vasopressin to maintain MAP greater than 65 - Titrate down vasopressors as tolerated   AoC anemia of renal  disease - stable  - Continue to monitor - Transfuse for hemoglobin < 8 or hemodynamically significant bleeding  Best practice:  Diet: TF  Pain/Anxiety/Delirium protocol (if indicated): N/A VAP protocol (if indicated): N/A DVT prophylaxis: heparin GI prophylaxis: Protonix Glucose control: CBG with SSI Mobility: BR Code Status: Full Family Communication: Wife updated at bedside Disposition: ICU  Labs   CBC: Recent Labs  Lab 11/19/19 1544 11/19/19 1635 11/20/19 0353 11/20/19 0626 11/21/19 0410 11/22/19 0317 11/23/19 0441  WBC 15.2*  --  13.4*  --  17.1* 23.0* 28.1*  HGB 6.9*   < > 8.1* 8.5* 8.4* 8.7* 8.8*  HCT 22.5*   < > 26.9* 25.0* 27.9* 29.2* 29.9*  MCV 94.9  --  96.1  --  96.9 97.0 96.8  PLT 249  --  213  --  233 267 267   < > = values in this interval not displayed.    Basic Metabolic Panel: Recent Labs  Lab 11/19/19 0342 11/19/19 0352 11/20/19 0353 11/20/19 0626 11/21/19 0410 11/21/19 1833 11/22/19 0317 11/22/19 1600 11/23/19 0441  NA 136   < > 134*   < > 133* 130* 133* 134* 135  K 5.5*   < > 4.8   < > 4.6 4.6 4.3 4.2 4.4  CL 104   < > 102   < > 100 98 100 100 99  CO2 24   < >  26   < > 27 24 24 27 25   GLUCOSE 222*   < > 251*   < > 239* 217* 175* 149* 176*  BUN 54*   < > 36*   < > 31* 32* 33* 30* 31*  CREATININE 3.72*   < > 2.31*   < > 1.96* 2.01* 1.89* 1.73* 1.69*  CALCIUM 7.5*   < > 7.2*   < > 7.2* 7.1* 7.4* 7.4* 7.7*  MG 2.6*  --  2.4  --  2.3  --  2.4  --  2.4  PHOS  --    < > 2.4*   < > 1.9* 1.9* 2.0* 1.9* 2.5   < > = values in this interval not displayed.   GFR: Estimated Creatinine Clearance: 44.7 mL/min (A) (by C-G formula based on SCr of 1.69 mg/dL (H)). Recent Labs  Lab 11/18/19 1323 11/18/19 1611 11/19/19 0342 11/19/19 1544 11/20/19 0353 11/21/19 0410 11/22/19 0317 11/23/19 0441  WBC  --   --   --    < > 13.4* 17.1* 23.0* 28.1*  LATICACIDVEN 2.0* 1.7 1.1  --   --   --   --   --    < > = values in this interval not displayed.     Liver Function Tests: Recent Labs  Lab 11/18/19 1101 11/18/19 1600 11/19/19 0342 11/19/19 0352 11/20/19 0353 11/20/19 1522 11/21/19 0410 11/21/19 1833 11/22/19 0317 11/22/19 1600 11/23/19 0441  AST 113*  --  86*  --  251*  --  131*  --  73*  --   --   ALT 104*  --  101*  --  189*  --  156*  --  121*  --   --   ALKPHOS 190*  --  198*  --  264*  --  324*  --  319*  --   --   BILITOT 1.3*  --  1.6*  --  1.4*  --  1.9*  --  1.6*  --   --   PROT 7.5  --  8.7*  --  8.5*  --  8.3*  --  9.1*  --   --   ALBUMIN 1.5*   < > 1.7*   < > 1.7*  1.6*   < > 1.7*  1.7* 1.7* 1.8*  1.8* 1.8* 1.8*   < > = values in this interval not displayed.   Recent Labs  Lab 11/18/19 1101  LIPASE 83*   No results for input(s): AMMONIA in the last 168 hours.  ABG    Component Value Date/Time   PHART 7.375 11/20/2019 0626   PCO2ART 39.8 11/20/2019 0626   PO2ART 103 11/20/2019 0626   HCO3 23.4 11/20/2019 0626   TCO2 25 11/20/2019 0626   ACIDBASEDEF 2.0 11/20/2019 0626   O2SAT 52.8 11/23/2019 0423     Coagulation Profile: Recent Labs  Lab 11/18/19 1101  INR 2.6*    Cardiac Enzymes: Recent Labs  Lab 11/20/19 1522  CKTOTAL 795*    HbA1C: Hemoglobin A1C  Date/Time Value Ref Range Status  06/07/2019 10:54 AM 6.4 (A) 4.0 - 5.6 % Final   Hgb A1c MFr Bld  Date/Time Value Ref Range Status  10/27/2019 04:51 AM 6.2 (H) 4.8 - 5.6 % Final    Comment:    (NOTE) Pre diabetes:          5.7%-6.4% Diabetes:              >6.4% Glycemic control for   <  7.0% adults with diabetes   08/20/2018 07:23 AM 6.9 (H) 4.8 - 5.6 % Final    Comment:    (NOTE) Pre diabetes:          5.7%-6.4% Diabetes:              >6.4% Glycemic control for   <7.0% adults with diabetes     CBG: Recent Labs  Lab 11/22/19 1536 11/22/19 2001 11/22/19 2322 11/23/19 0358 11/23/19 0814  GLUCAP 116* 106* 145* 132* 158*

## 2019-11-23 NOTE — Progress Notes (Addendum)
ANTICOAGULATION CONSULT NOTE - Follow Up Consult  Pharmacy Consult for Heparin Indication: atrial fibrillation  Allergies  Allergen Reactions  . Baclofen Other (See Comments)    Possibly stroke like symptoms  . Iodinated Diagnostic Agents Swelling, Rash and Other (See Comments)    Other Reaction: burning to mouth, swelling of lips  . Lipitor [Atorvastatin] Other (See Comments)    Leg pain  . Metoprolol Other (See Comments)    Headaches     Patient Measurements: Height: 5' 6.5" (168.9 cm) Weight: 83.9 kg (184 lb 15.5 oz) IBW/kg (Calculated) : 64.95 Heparin Dosing Weight: 82.9 kg  Vital Signs: Temp: 97.6 F (36.4 C) (04/28 0359) Temp Source: Axillary (04/28 0359) BP: 97/57 (04/28 0645) Pulse Rate: 93 (04/28 0700)  Labs: Recent Labs    11/20/19 1522 11/20/19 1522 11/21/19 0410 11/21/19 1833 11/22/19 0012 11/22/19 0317 11/22/19 0800 11/22/19 0800 11/22/19 1600 11/22/19 2248 11/23/19 0441  HGB  --    < > 8.4*  --   --  8.7*  --   --   --   --  8.8*  HCT  --   --  27.9*  --   --  29.2*  --   --   --   --  29.9*  PLT  --   --  233  --   --  267  --   --   --   --  267  APTT  --   --  41*   < >   < >  --  75*   < > 60* 84* 91*  HEPARINUNFRC  --   --   --    < >   < >  --  0.36  --  0.34  --  0.46  CREATININE 2.00*   < > 1.96*   < >  --  1.89*  --   --  1.73*  --  1.69*  CKTOTAL 795*  --   --   --   --   --   --   --   --   --   --    < > = values in this interval not displayed.    Estimated Creatinine Clearance: 44.7 mL/min (A) (by C-G formula based on SCr of 1.69 mg/dL (H)).   Assessment: 1 YOM with re-do CABG and AVR on 4/1 with post-op Afib. The patient had been managed on Apixaban however this was held on 4/23 due to a drop in Hgb. The patient received 2 units of PRBC on 4/24 and Hgb has been low but stable since. Pharmacy consulted to restart anticoagulation for Afib with Heparin.   Of noting, the patient did have a drop in platelets earlier this admission  concerning for HIT however has since tested negative. On 4/26 Restarted Heparin close to the previously known therapeutic rate.  4/28 AM update: aPTT 91 sec (slightly supra-therapeutic); heparin level 0.46. aPTT/heparin level not correlating yet. on gtt at 1300 units/hr. Hbg 8.8 (stable, low), plt wnl.  No bleeding noted.  Goal of Therapy:  Heparin level 0.3-0.5 units/ml aPTT 66-85 seconds Monitor platelets by anticoagulation protocol: Yes   Plan:  Reduce heparin gtt to 1250 units/hr Monitor daily heparin level and aPTT until correlating, CBC, s/sx bleeding   _________________________________________ Addendum:  Transitioning from heparin gtt to apixaban 5 mg PO BID. No concerns for bleeding.   Acey Lav, PharmD  PGY1 Acute Care Pharmacy Resident 11/23/2019 7:00 AM

## 2019-11-23 NOTE — Progress Notes (Signed)
Occupational Therapy Treatment Patient Details Name: Patrick [name] MRN: [id number] DOB: 04-Aug-1955 Today's Date: 11/23/2019    History of present illness Pt is a 64 yo male s/p re-do CABGx3 with Aortic valve replacement. Pt has been on CRRT but removed 4/11 due to hypotension and VTach with return to CRRT 4/12 pt with ileus. 4/23  While patient undergoing gated CT (to further evaluate pericardium) he had a PEA arrest. CPR done for approximately 3 minutes with ROSC and patient intubated.  PMHx: ESRD MWF, HTN, PNA, T2DM, MI.   OT comments   Session limited to bed level due to low BP. Pt following commands for BUE exercise/activity. Discussed LLE positioning with use of wedge to reduce external hip rotation and pillow under heel to increase terminal knee extension. Pt appropriately responding verbally. BP initially 86/38; End of session 93/47; SpO2 100 on 5L with Nelcor probe; HR 101 (paced). Wife stepped out at beginning of session but very supportive and wanting to participate in her husband's recovery. Discussed with Nsg and PT. Will continue to follow acutely. Continue to recommend CIR for rehab.   Follow Up Recommendations  CIR;Supervision/Assistance - 24 hour    Equipment Recommendations  3 in 1 bedside commode;Other (comment)    Recommendations for Other Services Rehab consult    Precautions / Restrictions Precautions Precautions: Fall;Sternal;Other (comment) Precaution Booklet Issued: No Precaution Comments: external pacer, flexiseal, cortrak; R femoral line       Mobility Bed Mobility               General bed mobility comments: deferred today  Transfers                 General transfer comment: NT, not medically ready    Balance                                           ADL either performed or assessed with clinical judgement   ADL Overall ADL's : Needs assistance/impaired     Grooming: Minimal assistance;Wash/dry hands;Wash/dry  face                                 General ADL Comments: limited by BP issues today     Vision       Perception     Praxis      Cognition Arousal/Alertness: Lethargic;Suspect due to medications Behavior During Therapy: Flat affect Overall Cognitive Status: Difficult to assess                                 General Comments: Following commands appropriately; verbal at times spontaneously        Exercises Exercises: Other exercises General Exercises - Upper Extremity Shoulder Flexion: Both;10 reps;Supine Elbow Flexion: AROM;Both;10 reps;Supine Elbow Extension: AROM;Both;10 reps;Seated General Exercises - Lower Extremity Ankle Circles/Pumps: AROM;Both;10 reps;Supine Heel Slides: Left;10 reps;Supine Hip Flexion/Marching: Left;10 reps;Supine Other Exercises Other Exercises: wedge placed beside LLE; encouraged terminal L knee extension   Shoulder Instructions       General Comments      Pertinent Vitals/ Pain       Pain Assessment: Faces Faces Pain Scale: Hurts even more Pain Location: LLE with knee extension adn internal rotation of hipq  Home Living  Prior Functioning/Environment              Frequency  Min 2X/week        Progress Toward Goals  OT Goals(current goals can now be found in the care plan section)  Progress towards OT goals: Progressing toward goals  Acute Rehab OT Goals Patient Stated Goal: per wife for pt to get well and go home OT Goal Formulation: With patient/family Time For Goal Achievement: 12/05/19 Potential to Achieve Goals: Good ADL Goals Pt Will Perform Grooming: with min guard assist;sitting Pt Will Perform Lower Body Dressing: with mod assist;sitting/lateral leans;sit to/from stand Pt Will Transfer to Toilet: with mod assist;stand pivot transfer;bedside commode Pt Will Perform Toileting - Clothing Manipulation and hygiene: with mod  assist;sitting/lateral leans;sit to/from stand;with adaptive equipment Pt/caregiver will Perform Home Exercise Program: Increased ROM;With written HEP provided Additional ADL Goal #1: Pt will tolerate x10 mins of ADL tasks with O2 sats >90% with 1 rest break in order to increase activity tolerance.  Plan Discharge plan remains appropriate    Co-evaluation                 AM-PAC OT "6 Clicks" Daily Activity     Outcome Measure   Help from another person eating meals?: Total Help from another person taking care of personal grooming?: A Lot Help from another person toileting, which includes using toliet, bedpan, or urinal?: Total Help from another person bathing (including washing, rinsing, drying)?: A Lot Help from another person to put on and taking off regular upper body clothing?: Total Help from another person to put on and taking off regular lower body clothing?: Total 6 Click Score: 8    End of Session Equipment Utilized During Treatment: Oxygen(5L)  OT Visit Diagnosis: Unsteadiness on feet (R26.81);Muscle weakness (generalized) (M62.81);Pain;Other symptoms and signs involving cognitive function Pain - Right/Left: Left Pain - part of body: Knee   Activity Tolerance Treatment limited secondary to medical complications (Comment)(low BP)   Patient Left in bed;with call bell/phone within reach;with nursing/sitter in room   Nurse Communication Mobility status;Other (comment)(positioning LLE)        Time: 1415-1435 OT Time Calculation (min): 20 min  Charges: OT General Charges $OT Visit: 1 Visit OT Treatments $Self Care/Home Management : 8-22 mins  Maurie Boettcher, OT/L   Acute OT Clinical Specialist [place] Pager [phone] Office [phone]    [facility] 11/23/2019, 3:52 PM

## 2019-11-23 NOTE — Progress Notes (Signed)
Physical Therapy Treatment Patient Details Name: Patrick [name] MRN: [id number] DOB: 08/23/1955 Today's Date: 11/23/2019    History of Present Illness Pt is a 64 yo male s/p re-do CABGx3 with Aortic valve replacement. Pt has been on CRRT but removed 4/11 due to hypotension and VTach with return to CRRT 4/12 pt with ileus. 4/23  While patient undergoing gated CT (to further evaluate pericardium) he had a PEA arrest. CPR done for approximately 3 minutes with ROSC and patient intubated.  PMHx: ESRD MWF, HTN, PNA, T2DM, MI.    PT Comments    Treatment limited with pt about to undergo procedure in the room and s/p OT few minutes earlier.  Wife in the room and instructed in sternal precautions and in HEP for bed level activities pt can perform when she is there and he is able to participate.  Handouts given for both.  PT to follow, continue with CIR recommendation..    Follow Up Recommendations  CIR;Supervision/Assistance - 24 hour     Equipment Recommendations  Rolling walker with 5" wheels    Recommendations for Other Services       Precautions / Restrictions Precautions Precautions: Fall;Sternal;Other (comment) Precaution Booklet Issued: No Precaution Comments: external pacer, flexiseal, cortrak; R femoral line    Mobility  Bed Mobility               General bed mobility comments: deferred today  Transfers                 General transfer comment: NT, not medically ready  Ambulation/Gait                 Stairs             Wheelchair Mobility    Modified Rankin (Stroke Patients Only)       Balance                                            Cognition Arousal/Alertness: Lethargic;Suspect due to medications Behavior During Therapy: Flat affect Overall Cognitive Status: Difficult to assess                                 General Comments: Following commands appropriately; verbal at times spontaneously       Exercises General Exercises - Upper Extremity Shoulder Flexion: Both;10 reps;Supine Elbow Flexion: AROM;Both;10 reps;Supine Elbow Extension: AROM;Both;10 reps;Seated General Exercises - Lower Extremity Ankle Circles/Pumps: AROM;Both;10 reps;Supine Heel Slides: Left;10 reps;Supine Hip Flexion/Marching: Left;10 reps;Supine Other Exercises Other Exercises: wedge placed beside LLE; encouraged terminal L knee extension    General Comments General comments (skin integrity, edema, etc.): Patient for procedure so spent time educating his wife in sternal precautions and bed level exercises she can assist him to perform with handouts given.      Pertinent Vitals/Pain Pain Assessment: (not assessed, pt for procedure) Faces Pain Scale: Hurts even more Pain Location: LLE with knee extension adn internal rotation of hipq    Home Living                      Prior Function            PT Goals (current goals can now be found in the care plan section) Acute Rehab PT Goals Patient Stated Goal: per wife for  pt to get well and go home Progress towards PT goals: Progressing toward goals    Frequency    Min 3X/week      PT Plan Current plan remains appropriate    Co-evaluation              AM-PAC PT "6 Clicks" Mobility   Outcome Measure  Help needed turning from your back to your side while in a flat bed without using bedrails?: Total Help needed moving from lying on your back to sitting on the side of a flat bed without using bedrails?: Total Help needed moving to and from a bed to a chair (including a wheelchair)?: Total Help needed standing up from a chair using your arms (e.g., wheelchair or bedside chair)?: Total Help needed to walk in hospital room?: Total Help needed climbing 3-5 steps with a railing? : Total 6 Click Score: 6    End of Session   Activity Tolerance: Other (comment)(limited with pt about to have procedure) Patient left: in bed;with call  bell/phone within reach;with family/visitor present   PT Visit Diagnosis: Muscle weakness (generalized) (M62.81);Other abnormalities of gait and mobility (R26.89)     Time: 4327-6147 PT Time Calculation (min) (ACUTE ONLY): 9 min  Charges:  $Self Care/Home Management: [place] [phone] 11/23/2019    Reginia Naas 11/23/2019, 5:12 PM

## 2019-11-24 DIAGNOSIS — R7401 Elevation of levels of liver transaminase levels: Secondary | ICD-10-CM

## 2019-11-24 DIAGNOSIS — Z951 Presence of aortocoronary bypass graft: Secondary | ICD-10-CM | POA: Diagnosis not present

## 2019-11-24 DIAGNOSIS — I255 Ischemic cardiomyopathy: Secondary | ICD-10-CM

## 2019-11-24 DIAGNOSIS — I214 Non-ST elevation (NSTEMI) myocardial infarction: Secondary | ICD-10-CM | POA: Diagnosis not present

## 2019-11-24 DIAGNOSIS — N186 End stage renal disease: Secondary | ICD-10-CM | POA: Diagnosis not present

## 2019-11-24 DIAGNOSIS — R739 Hyperglycemia, unspecified: Secondary | ICD-10-CM

## 2019-11-24 DIAGNOSIS — K567 Ileus, unspecified: Secondary | ICD-10-CM | POA: Diagnosis not present

## 2019-11-24 DIAGNOSIS — J9601 Acute respiratory failure with hypoxia: Secondary | ICD-10-CM | POA: Diagnosis not present

## 2019-11-24 LAB — RENAL FUNCTION PANEL
Albumin: 1.5 g/dL — ABNORMAL LOW (ref 3.5–5.0)
Anion gap: 10 (ref 5–15)
BUN: 73 mg/dL — ABNORMAL HIGH (ref 8–23)
CO2: 22 mmol/L (ref 22–32)
Calcium: 6.9 mg/dL — ABNORMAL LOW (ref 8.9–10.3)
Chloride: 102 mmol/L (ref 98–111)
Creatinine, Ser: 3.92 mg/dL — ABNORMAL HIGH (ref 0.61–1.24)
GFR calc Af Amer: 17 mL/min — ABNORMAL LOW (ref >60)
GFR calc non Af Amer: 15 mL/min — ABNORMAL LOW (ref >60)
Glucose, Bld: 193 mg/dL — ABNORMAL HIGH (ref 70–99)
Phosphorus: 3.1 mg/dL (ref 2.5–4.6)
Potassium: 4.1 mmol/L (ref 3.5–5.1)
Sodium: 134 mmol/L — ABNORMAL LOW (ref 135–145)

## 2019-11-24 LAB — CBC
HCT: 27.7 % — ABNORMAL LOW (ref 39.0–52.0)
Hemoglobin: 8.2 g/dL — ABNORMAL LOW (ref 13.0–17.0)
MCH: 28.8 pg (ref 26.0–34.0)
MCHC: 29.6 g/dL — ABNORMAL LOW (ref 30.0–36.0)
MCV: 97.2 fL (ref 80.0–100.0)
Platelets: 273 10*3/uL (ref 150–400)
RBC: 2.85 MIL/uL — ABNORMAL LOW (ref 4.22–5.81)
RDW: 20.5 % — ABNORMAL HIGH (ref 11.5–15.5)
WBC: 21.5 10*3/uL — ABNORMAL HIGH (ref 4.0–10.5)
nRBC: 0.7 % — ABNORMAL HIGH (ref 0.0–0.2)

## 2019-11-24 LAB — GLUCOSE, CAPILLARY
Glucose-Capillary: 137 mg/dL — ABNORMAL HIGH (ref 70–99)
Glucose-Capillary: 143 mg/dL — ABNORMAL HIGH (ref 70–99)
Glucose-Capillary: 148 mg/dL — ABNORMAL HIGH (ref 70–99)
Glucose-Capillary: 167 mg/dL — ABNORMAL HIGH (ref 70–99)
Glucose-Capillary: 171 mg/dL — ABNORMAL HIGH (ref 70–99)
Glucose-Capillary: 174 mg/dL — ABNORMAL HIGH (ref 70–99)
Glucose-Capillary: 182 mg/dL — ABNORMAL HIGH (ref 70–99)

## 2019-11-24 LAB — COOXEMETRY PANEL
Carboxyhemoglobin: 1.9 % — ABNORMAL HIGH (ref 0.5–1.5)
Methemoglobin: 1.4 % (ref 0.0–1.5)
O2 Saturation: 77.6 %
Total hemoglobin: 8.5 g/dL — ABNORMAL LOW (ref 12.0–16.0)

## 2019-11-24 LAB — MAGNESIUM: Magnesium: 2.5 mg/dL — ABNORMAL HIGH (ref 1.7–2.4)

## 2019-11-24 MED ORDER — SODIUM CHLORIDE 0.9 % IV SOLN
1.0000 g | INTRAVENOUS | Status: DC
  Administered 2019-11-24 – 2019-11-27 (×4): 1 g via INTRAVENOUS
  Filled 2019-11-24: qty 1
  Filled 2019-11-24 (×2): qty 10
  Filled 2019-11-24: qty 1
  Filled 2019-11-24: qty 10

## 2019-11-24 MED ORDER — ALTEPLASE 2 MG IJ SOLR
2.0000 mg | Freq: Once | INTRAMUSCULAR | Status: AC
  Administered 2019-11-24: 2 mg
  Filled 2019-11-24: qty 2

## 2019-11-24 MED ORDER — MIDODRINE HCL 5 MG PO TABS
20.0000 mg | ORAL_TABLET | Freq: Three times a day (TID) | ORAL | Status: DC
  Administered 2019-11-24 – 2019-11-25 (×6): 20 mg
  Filled 2019-11-24 (×6): qty 4

## 2019-11-24 MED ORDER — VANCOMYCIN VARIABLE DOSE PER UNSTABLE RENAL FUNCTION (PHARMACIST DOSING): Status: DC

## 2019-11-24 MED ORDER — VANCOMYCIN HCL 1750 MG/350ML IV SOLN
1750.0000 mg | Freq: Once | INTRAVENOUS | Status: AC
  Administered 2019-11-24: 1750 mg via INTRAVENOUS
  Filled 2019-11-24: qty 350

## 2019-11-24 NOTE — Progress Notes (Addendum)
Patient ID: Patrick [name], male   DOB: [birth year], 64 y.o.   MRN: [id number]     Advanced Heart Failure Rounding Note  PCP-Cardiologist: Dorris Carnes, MD     Patient Profile   65 y/o male w/ h/o CAD s/p remote CABG in 1997 w/ LIMA-LAD, failed kidney transplant, end-stage renal disease on dialysis admitted w/ CP. LHC w/ severe CAD. Echo w/ reduced EF 35-40% and moderate AI. S/p redo CABG x 3 on 4/1 (SVG-LAD, SVG-PDA, SVG-OM) + bioprosthetic AVR. Post operative course c/b by cardiogenic shock, respiratory failure, VT, and protein malnutrition.    Subjective:    Patient had respiratory arrest with PEA while in CT scanner 4/23, had 5 minutes CPR with drugs, achieved ROSC and was intubated.  K noted > 7.  He was extubated on 4/27.   CVVH stopped 4/28   Remains on NE 12 mcg + VP 0.03.  Denies pain. Denies shortness of breath.    Objective:   Weight Range: 82.6 kg Body mass index is 28.95 kg/m.   Vital Signs:   Temp:  [97.2 F (36.2 C)-98.3 F (36.8 C)] 98.1 F (36.7 C) (04/29 0345) Pulse Rate:  [53-117] 92 (04/29 0345) Resp:  [14-37] 22 (04/29 0345) BP: (73-105)/(37-70) 91/61 (04/29 0345) SpO2:  [74 %-100 %] 100 % (04/29 0345) Weight:  [82.6 kg] 82.6 kg (04/29 0600) Last BM Date: 11/23/19  Weight change: Filed Weights   11/22/19 1618 11/23/19 0348 11/24/19 0600  Weight: 86.9 kg 83.9 kg 82.6 kg    Intake/Output:   Intake/Output Summary (Last 24 hours) at 11/24/2019 0706 Last data filed at 11/24/2019 0600 Gross per 24 hour  Intake 1168.95 ml  Output 0 ml  Net 1168.95 ml      Physical Exam   CVP 1 General:   No resp difficulty HEENT: Cortrak  Neck: supple. no JVD. Carotids 2+ bilat; no bruits. No lymphadenopathy or thryomegaly appreciated. Cor: PMI nondisplaced. Regular rate & rhythm. No rubs, gallops or murmurs. L subclavian HF catheter R subclavian central line  Lungs: clear Abdomen: soft, nontender, nondistended. No hepatosplenomegaly. No bruits or masses.  Good bowel sounds. Extremities: no cyanosis, clubbing, rash, edema Neuro: alert & orientedx2, cranial nerves grossly intact. moves all 4 extremities w/o difficulty. Affect flat    Telemetry   A paced 90s   Labs    CBC Recent Labs    11/23/19 0441 11/24/19 0524  WBC 28.1* 21.5*  NEUTROABS 23.8*  --   HGB 8.8* 8.2*  HCT 29.9* 27.7*  MCV 96.8 97.2  PLT 267 810   Basic Metabolic Panel Recent Labs    11/23/19 0441 11/23/19 0441 11/23/19 1624 11/24/19 0524  NA 135   < > 133* 134*  K 4.4   < > 4.2 4.1  CL 99   < > 100 102  CO2 25   < > 24 22  GLUCOSE 176*   < > 191* 193*  BUN 31*   < > 50* 73*  CREATININE 1.69*   < > 2.61* 3.92*  CALCIUM 7.7*   < > 7.1* 6.9*  MG 2.4  --   --  2.5*  PHOS 2.5   < > 2.9 3.1   < > = values in this interval not displayed.   Liver Function Tests Recent Labs    11/22/19 0317 11/22/19 1600 11/23/19 1157 11/23/19 1157 11/23/19 1624 11/24/19 0524  AST 73*  --  44*  --   --   --   ALT 121*  --  81*  --   --   --   ALKPHOS 319*  --  253*  --   --   --   BILITOT 1.6*  --  1.8*  --   --   --   PROT 9.1*  --  8.1  --   --   --   ALBUMIN 1.8*  1.8*   < > 1.7*   < > 1.7* 1.5*   < > = values in this interval not displayed.   Recent Labs    11/23/19 1157  LIPASE 28   Cardiac Enzymes No results for input(s): CKTOTAL, CKMB, CKMBINDEX, TROPONINI in the last 72 hours.  BNP: BNP (last 3 results) Recent Labs    11/20/19 0353 11/21/19 0410  BNP 374.6* 519.5*    ProBNP (last 3 results) No results for input(s): PROBNP in the last 8760 hours.   D-Dimer No results for input(s): DDIMER in the last 72 hours. Hemoglobin A1C No results for input(s): HGBA1C in the last 72 hours. Fasting Lipid Panel No results for input(s): CHOL, HDL, LDLCALC, TRIG, CHOLHDL, LDLDIRECT in the last 72 hours. Thyroid Function Tests No results for input(s): TSH, T4TOTAL, T3FREE, THYROIDAB in the last 72 hours.  Invalid input(s): FREET3  Other  results:   Imaging    DG Abd Portable 1V  Result Date: 11/23/2019 CLINICAL DATA:  Abdominal pain and distension EXAM: PORTABLE ABDOMEN - 1 VIEW COMPARISON:  November 09, 2019 FINDINGS: Feeding tube tip is in the second portion of the duodenum. There are loops of mildly dilated small bowel. No air-fluid levels. No evident free air. There are multiple foci of pelvic arterial vascular calcification. IMPRESSION: Feeding tube tip in second portion of duodenum. Mildly dilated small bowel may be indicative of a degree of early ileus or enteritis. Bowel obstruction less likely. No free air evident. Electronically Signed   By: Lowella Grip III M.D.   On: 11/23/2019 11:16     Medications:     Scheduled Medications: . amiodarone  200 mg Per Tube BID  . apixaban  5 mg Oral BID  . chlorhexidine gluconate (MEDLINE KIT)  15 mL Mouth Rinse BID  . Chlorhexidine Gluconate Cloth  6 each Topical Daily  . clopidogrel  75 mg Per Tube Daily  . darbepoetin (ARANESP) injection - NON-DIALYSIS  60 mcg Subcutaneous Q Sat-1800  . docusate  100 mg Per Tube BID  . feeding supplement (PRO-STAT SUGAR FREE 64)  60 mL Per Tube BID  . insulin aspart  0-9 Units Subcutaneous Q4H  . insulin glargine  6 Units Subcutaneous Daily  . mouth rinse  15 mL Mouth Rinse 10 times per day  . midodrine  15 mg Per Tube TID WC  . multivitamin  1 tablet Per Tube QHS  . pantoprazole sodium  40 mg Per Tube Daily  . rosuvastatin  10 mg Per Tube q1800  . sodium chloride flush  10-40 mL Intracatheter Q12H    Infusions: . sodium chloride Stopped (11/21/19 1749)  . sodium chloride    . sodium chloride    . feeding supplement (VITAL 1.5 CAL) Stopped (11/22/19 1457)  . fentaNYL infusion INTRAVENOUS Stopped (11/22/19 0948)  . norepinephrine (LEVOPHED) Adult infusion 16 mcg/min (11/24/19 0600)  . vasopressin (PITRESSIN) infusion - *FOR SHOCK* 0.03 Units/min (11/24/19 0600)    PRN Medications: sodium chloride, Place/Maintain arterial  line **AND** sodium chloride, Place/Maintain arterial line **AND** sodium chloride, acetaminophen (TYLENOL) oral liquid 160 mg/5 mL, alteplase, dextrose, diphenhydrAMINE, fentaNYL, fentaNYL (SUBLIMAZE)  injection, Gerhardt's butt cream, heparin, heparin, levalbuterol, lip balm, metoprolol tartrate, midazolam, ondansetron (ZOFRAN) IV, oxyCODONE, phenol, sodium chloride, sodium chloride flush   Assessment/Plan   1. Cardiogenic shock: Post-op.  Echo 4/14 with EF 30-35%, stable aortic valve replacement.  Echo on 4/21 with EF 45-50% and significant respirophasic septal variation.  Pericardium in some places appears thickened though windows are poor.  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 CT chest 4/23, difficult images as the patient became unstable during the scan. There did not appear to be significant pericardial pathology. S/p PEA arrest 4/23.  CVP 1. Pressors remains soft.  Now on NE 12, VP 0.03, midodrine 15 tid.  CO-OX 78%  2. ESRD:  CVVHD stopped 4/28.  3. Possible aspiration PNA: He has completed empiric vanco/meropenem.  4. Atrial fibrillation: Paroxysmal.  A Paced 90 .  - Continue amiodarone 200 mg bid for now. - Continue apixaban 5 mg bid.  5. Anemia, post-op: Hgb 8.2 today.  6. CAD: Redo CABG x 3 this admission on 4/1 (SVG-LAD, SVG-PDA, SVG-OM).   - Continue Plavix and Crestor.  7. VT:  Frequent NSVT, now on Amiodarone with improvement. 8 Acute hypoxic respiratory failure: due to pulmonary edema and aspiration PNA.  Re-intubated after PEA arrest 4/23. Extubated again on 4/27.  Has pleural effusions but did not appear amenable to thoracentesis per CCM.  9. ID: Afebrile.  Cultures NGTD. C diff negative. Sputum GS 4/23. Rare GNR.  10. PEA arrest on 4/23.  Resuscitated with CPR.  Suspect due to hypoxemia.  11. Elevated LFTs: ?Shock liver.  RUQ US unremarkable.  - Trending down. 12. Severe Deconditioning PT/OT following. Recommending CIR.    13. Ileus: Tube feeds held.   Length of Stay: [place], NP  11/24/2019, 7:06 AM  Advanced Heart Failure Team Pager [phone] (M-F; 7a - 4p)   Patient seen with NP, agree with the above note.   Currently on NE 15 and vasopressin 0.03, CVP 1 with co-ox 78%.  Not much change clinically.  WBCs up to 21.5 but afebrile.   General: NAD Neck: No JVD, no thyromegaly or thyroid nodule.  Lungs: Clear to auscultation bilaterally with normal respiratory effort. CV: Nondisplaced PMI.  Heart regular S1/S2, no S3/S4, no murmur.  No peripheral edema.   Abdomen: Soft, mild diffuse tenderness, no hepatosplenomegaly, no distention.  Skin: Intact without lesions or rashes.  Neurologic: Alert and oriented x 3.  Psych: Normal affect. Extremities: No clubbing or cyanosis.  HEENT: Normal.   Volume down s/p CVVH, no HD needed at this point.  Continue to wean NE as tolerated.    Continue to work with PT, will need CIR.    Follow closely for infection with rise in WBCs, empiric antibiotics if spikes fever.   Loralie Champagne 11/24/2019 7:27 AM

## 2019-11-24 NOTE — Progress Notes (Signed)
TCTS BRIEF SICU PROGRESS NOTE  28 Days Post-Op  S/P Procedure(s) (LRB): REDO CORONARY ARTERY BYPASS GRAFTING TIMES 3  USING LEFT GREATER SAPHENOUS LEG VEIN HARVESTED ENDOSCOPICALLY(CABG) (N/A) TRANSESOPHAGEAL ECHOCARDIOGRAM (TEE) (N/A) AORTIC VALVE REPLACEMENT (AVR) USING EDWARDS INTUITY 23 MM AORTIC VALVE. (N/A) Endovein Harvest Of Greater Saphenous Vein (Left)   Stable day Remains on levophed and vasopressin drips Breathing comfortably w/ O2 sats 100% on 2 L/min  Plan: Continue current plan  Rexene Alberts, MD 11/24/2019 6:48 PM

## 2019-11-24 NOTE — TOC Initial Note (Signed)
Transition of Care [facility]) - Initial/Assessment Note    Patient Details  Name: Patrick [name] MRN: [id number] Date of Birth: 02-22-1956  Transition of [facility]) CM/SW Contact:    Curlene Labrum, RN Phone Number: 11/24/2019, 11:20 AM  Clinical Narrative:                 64 y/o male w/ h/o CAD, HTN, HLD, DM, ESRD.CABG x 1 1997, failed kidney transplant,end-stage renal disease on dialysis admitted 3/29  w/ CP, NSTEMI . He underwent  re-do CABG x 3  on 4/1. Post operative course c/b by cardiogenic shock, respiratory failure, VT,illius, and protein malnutrition. Pt. Had respiratory /  PEA arrest in CT on 4/23 requiring intubation. PCCM have been asked to consult  for respiratory failure/ ventilation management post arrest.  Case management met with the patient and wife, Freda Munro, at the bedside to discuss goals of placement at a [place], [place] once patient is stable and ready for care outside of the ICU.  Patient's family given Medicare choice regarding bed availability and patient's wife receptive to placement at [facility] for LTAC.  [facility], currently, does not have bed availability for HD patients this week and possibly next week as well.  Patient lives at home with his wife in a second floor 3 bedroom apartment.  The patient's wife plans to move to the lower level in the next few weeks if possible so that [facility] choice after [facility] will be more feasible.  Patient currently does not have DME at home and was ambulatory before this hospitalization.  He was taking the [facility] bus to dialysis everyday.  [facility] allows daily visits for family 7 days a week from 9 am to 6 every day.  I spoke with Dr. Orvan Seen and notified him of the family's choice and agreement for LTAC when he is stable for transfer and off IV pressors and CRRT.  Will continue to follow for a safe transition and communication with Kathee Delton, CM at [facility].  Expected Discharge Plan: Long Term Acute Care  (LTAC) Barriers to Discharge: Continued Medical Work up   Patient Goals and CMS Choice Patient states their goals for this hospitalization and ongoing recovery are:: Patient's wife receptive to transferring the patient to [facility] when able. CMS Medicare.gov Compare Post Acute Care list provided to:: Patient Represenative (must comment)(Sheila, wife) Choice offered to / list presented to : [facility] POA / Guardian  Expected Discharge Plan and Services Expected Discharge Plan: Long Term Acute Care (LTAC)   Discharge Planning Services: CM Consult Post Acute Care Choice: Long Term Acute Care (LTAC) Living arrangements for the past 2 months: Apartment(lives in apartment on second floor - planning to change to 1st floor soon)                                      Prior Living Arrangements/Services Living arrangements for the past 2 months: Apartment(lives in apartment on second floor - planning to change to 1st floor soon) Lives with:: Spouse Patient language and need for interpreter reviewed:: Yes Do you feel safe going back to the place where you live?: No   Planning for LTAc after ICU  Need for Family Participation in Patient Care: Yes (Comment) Care giver support system in place?: Yes (comment)   Criminal Activity/Legal Involvement Pertinent to Current Situation/Hospitalization: No - Comment as needed  Activities of Daily Living  Home Assistive Devices/Equipment: None ADL Screening (condition at time of admission) Patient's cognitive ability adequate to safely complete daily activities?: Yes Is the patient deaf or have difficulty hearing?: No Does the patient have difficulty seeing, even when wearing glasses/contacts?: No Does the patient have difficulty concentrating, remembering, or making decisions?: No Patient able to express need for assistance with ADLs?: Yes Does the patient have difficulty dressing or bathing?: No Independently performs ADLs?: Yes (appropriate  for developmental age) Does the patient have difficulty walking or climbing stairs?: Yes Weakness of Legs: None Weakness of Arms/Hands: None  Permission Sought/Granted Permission sought to share information with : Case Manager Permission granted to share information with : Yes, Verbal Permission Granted     Permission granted to share info w AGENCY: [facility], Kathee Delton, [facility]  Permission granted to share info w Relationship: wife     Emotional Assessment Appearance:: Appears stated age Attitude/Demeanor/Rapport: Engaged Affect (typically observed): Accepting Orientation: : Oriented to Self, Oriented to Place, Oriented to  Time, Oriented to Situation Alcohol / Substance Use: Not Applicable Psych Involvement: No (comment)  Admission diagnosis:  NSTEMI (non-ST elevated myocardial infarction) ([place]) [I21.4] Symptomatic anemia [D64.9] S/P CABG x 3 [Z95.1] Patient Active Problem List   Diagnosis Date Noted  . Cardiac arrest ([place])   . Pressure injury of skin 11/10/2019  . S/P CABG x 3 10/27/2019  . NSTEMI (non-ST elevated myocardial infarction) ([place]) 10/24/2019  . History of renal transplant 09/06/2019  . Vitamin D deficiency 09/06/2019  . Encounter for immunization 04/20/2019  . Unstable angina ([place])   . ACS (acute coronary syndrome) ([place]) 02/14/2019  . Iron deficiency anemia, unspecified 01/10/2019  . Coagulation defect, unspecified ([place]) 11/01/2018  . Complication of vascular dialysis catheter 11/01/2018  . Diarrhea, unspecified 11/01/2018  . Pain, unspecified 11/01/2018  . Pruritus, unspecified 11/01/2018  . Acute on chronic diastolic (congestive) heart failure ([place]) 10/27/2018  . SOB (shortness of breath) 10/02/2018  . Peritoneal abscess ([place]) 10/01/2018  . Nausea and vomiting 08/20/2018  . Chronic anemia 05/12/2018  . Hypokalemia 05/12/2018  . Acute combined systolic and diastolic congestive heart failure ([place]) 05/12/2018  . Hyperlipidemia 05/12/2018  .  Physical deconditioning 05/12/2018  . Multifocal pneumonia 05/11/2018  . Allergy to IVP dye, subsequent encounter   . CAD (coronary artery disease) 11/27/2017  . Atherosclerosis of native arteries of extremity with intermittent claudication ([place]) 04/03/2017  . PAD (peripheral artery disease) ([place]) 02/02/2017  . Typical atrial flutter ([place]) 09/24/2016  . PAF (paroxysmal atrial fibrillation) ([place]) 05/25/2016  . ESRD (end stage renal disease) ([place]) 05/11/2016  . Anemia of chronic disease   . End-stage renal disease on hemodialysis ([place])   . Renovascular hypertension   . History of MI (myocardial infarction)   . Acute respiratory failure with hypoxia ([place])   . Controlled diabetes mellitus type 2 with complications ([place])   . Pleural effusion   . Symptomatic anemia 01/22/2016  . Acute respiratory failure ([place]) 01/22/2016  . Acute on chronic diastolic CHF (congestive heart failure) ([place]) 01/22/2016  . Elevated troponin 01/22/2016  . Febrile illness 01/22/2016  . Activity of daily living alteration 12/06/2015  . Difficulty walking 11/29/2015  . Chest pain 11/17/2015  . Normocytic anemia 11/17/2015  . Gynecomastia 11/17/2015  . Renal stone 11/17/2015  . Cough 11/17/2015  . Dyspnea 10/30/2015  . CAP (community acquired pneumonia)   . HCAP (healthcare-associated pneumonia) 10/11/2015  . Acute respiratory disease 10/11/2015  . Protein-calorie malnutrition, severe ([place]) 09/16/2014  . Kidney transplant failure  09/14/2014  . Metabolic acidosis, NAG, failure of bicarbonate regeneration 09/14/2014  . Heart murmur 02/24/2014  . Anemia in chronic kidney disease 11/24/2013  . 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 Hypocalcemia 11/24/2013  . Low bicarbonate 11/24/2013  . CKD (chronic kidney disease) stage 5, GFR less than 15 ml/min (HCC) 10/12/2013  . Acute kidney injury ([place]) 09/29/2013  . Immunosuppressed status ([place]) 07/07/2013  . Essential hypertension 03/19/2013  . Acute gout 03/19/2013   . Impotence of organic origin 03/19/2013  . Secondary hyperparathyroidism ([place]) 03/19/2013  . End-stage renal failure with renal transplant ([place]) 03/19/2013  . Gout 03/26/2012  . 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 [place] [address] Phone: [phone] Fax: [phone]     Social Determinants of Health (SDOH) Interventions    Readmission Risk Interventions Readmission Risk Prevention Plan 11/24/2019  Transportation Screening Complete  Medication Review (RN Care Manager) Complete  PCP or Specialist appointment within 3-5 days of discharge Complete  HRI or Home Care Consult Complete  SW Recovery Care/Counseling Consult Complete  Palliative Care Screening Complete  Skilled Nursing Facility Complete  Some recent data might be hidden

## 2019-11-24 NOTE — Progress Notes (Signed)
  Speech Language Pathology Treatment: Dysphagia  Patient Details Name: Patrick [name] MRN: [id number] DOB: 1955-08-08 Today's Date: 11/24/2019 Time: 5456-2563 SLP Time Calculation (min) (ACUTE ONLY): 20 min  Assessment / Plan / Recommendation Clinical Impression  Skilled ST for dysphagia intervention and management of swallowing ability. Had abdominal pain yesterday and chart reveals ileus and tube feeds stopped. Participatory and interactive with wife at bedside and significantly deconditioned. Pt's respiratory system depressed and much effort needed to increase vocal intensity and produce coughs and throat clear. Wet vocal quality and reflexive throat clearing/coughs with ice chip and water trials, not present via observation with applesauce. Since first swallow assessment where pt initiated diet he had a medical decline and required ventilator support for second time this admission. Ice chips after mouth care are recommended and instrumental testing. FEES preferred with dysphonia post intubation however pt politely refused several times therefore MBS recommended. Pt diagnosed with ileus and will need to determine if pt can have barium during an MBS. Continue ice chips.   HPI HPI: 64 year old male with PMH of CABG in 1997 s/p stent, ESRD on HD s/p renal transplant, presented to [facility] 3/29 with CP, SOB. Dx NSTEMI and multivessel CAD. Underwent 3vCABG and AVR 4/1 and balloon pump placement. Intubated 4/1 for surgery, extubated 4/4. PEA 4/23 and reintubated for4 days.      SLP Plan  MBS       Recommendations  Diet recommendations: Other(comment)(ic chips)                Oral Care Recommendations: Oral care QID Follow up Recommendations: (TBD) SLP Visit Diagnosis: Dysphagia, unspecified (R13.10) Plan: MBS       GO                Houston Siren 11/24/2019, 3:37 PM

## 2019-11-24 NOTE — Progress Notes (Signed)
Patient ID: Patrick [name], male   DOB: 04/10/1956, 64 y.o.   MRN: [id number] [facility] Progress Note   Assessment/ Plan:   1.  Redo coronary artery bypass grafting complicated by cardiogenic shock: Patient with history of coronary artery disease/NSTEMI.  Successfully extubated 2 days ago and weaned off of NIPPV, currently on oxygen via nasal cannula.  He remains on pressors and I will increase his midodrine to 20 mg 3 times daily to try and facilitate weaning off former. 2. ESRD: Previously on CRRT due to hemodynamic instability-this was stopped yesterday after accomplishing successful ultrafiltration/CVP goal.  He does not have any acute indication for dialysis today and will plan for hemodialysis versus CRRT tomorrow based on his hemodynamic status/blood pressure tomorrow morning. 3. Anemia: Relatively stable hemoglobin and hematocrit with slight drop over the past 24 hours without overt loss.  We will continue to follow this closely to decide on need for PRBC. 4. CKD-MBD: Phosphorus level improved with supplementation end of CRRT. 5. Nutrition: Remains n.p.o., on vital 1.5 tube feeds with prostat supplements. 6.  Atrial fibrillation: With controlled ventricular response, remains on pressors.  Subjective:   Without acute events overnight after discontinuation of CRRT yesterday morning.  Remains on pressors.   Objective:   BP (!) 84/55   Pulse 92   Temp 98.1 F (36.7 C) (Oral)   Resp (!) 21   Ht 5' 6.5" (1.689 m)   Wt 82.6 kg   SpO2 100%   BMI 28.95 kg/m   Physical Exam: Gen: Comfortably resting in bed, on oxygen via nasal cannula.  Awake and alert CVS: Pulse regular rhythm, normal rate, S1 and S2 normal.  Intact sternotomy scar/staples Resp: Anteriorly clear to auscultation, no rales/rhonchi Abd: Soft, obese, nontender Ext: No lower extremity edema.  Labs: BMET Recent Labs  Lab 11/21/19 0410 11/21/19 1833 11/22/19 0317 11/22/19 1600 11/23/19 0441  11/23/19 1624 11/24/19 0524  NA 133* 130* 133* 134* 135 133* 134*  K 4.6 4.6 4.3 4.2 4.4 4.2 4.1  CL 100 98 100 100 99 100 102  CO2 _0 GLUCOSE 239* 217* 175* 149* 176* 191* 193*  BUN 31* 32* 33* 30* 31* 50* 73*  CREATININE 1.96* 2.01* 1.89* 1.73* 1.69* 2.61* 3.92*  CALCIUM 7.2* 7.1* 7.4* 7.4* 7.7* 7.1* 6.9*  PHOS 1.9* 1.9* 2.0* 1.9* 2.5 2.9 3.1   CBC Recent Labs  Lab 11/21/19 0410 11/22/19 0317 11/23/19 0441 11/24/19 0524  WBC 17.1* 23.0* 28.1* 21.5*  NEUTROABS  --   --  23.8*  --   HGB 8.4* 8.7* 8.8* 8.2*  HCT 27.9* 29.2* 29.9* 27.7*  MCV 96.9 97.0 96.8 97.2  PLT 233 267 267 273     Medications:    . amiodarone  200 mg Per Tube BID  . apixaban  5 mg Oral BID  . chlorhexidine gluconate (MEDLINE KIT)  15 mL Mouth Rinse BID  . Chlorhexidine Gluconate Cloth  6 each Topical Daily  . clopidogrel  75 mg Per Tube Daily  . darbepoetin (ARANESP) injection - NON-DIALYSIS  60 mcg Subcutaneous Q Sat-1800  . docusate  100 mg Per Tube BID  . feeding supplement (PRO-STAT SUGAR FREE 64)  60 mL Per Tube BID  . insulin aspart  0-9 Units Subcutaneous Q4H  . insulin glargine  6 Units Subcutaneous Daily  . mouth rinse  15 mL Mouth Rinse 10 times per day  . midodrine  15 mg Per Tube TID WC  .  multivitamin  1 tablet Per Tube QHS  . pantoprazole sodium  40 mg Per Tube Daily  . rosuvastatin  10 mg Per Tube q1800  . sodium chloride flush  10-40 mL Intracatheter Q12H   Elmarie Shiley, MD 11/24/2019, 7:36 AM

## 2019-11-24 NOTE — Progress Notes (Signed)
Pharmacy Antibiotic Note  Patrick [name] is a 64 y.o. male admitted on 10/24/2019 with cellulitis.  Pharmacy has been consulted for Vancomycin dosing. Patient has ESRD and was on CRRT until 4/28 AM. Per Nephrology, no acute need for HD and to re-evaluate daily.   Plan: Vancomycin 1750mg  IV x1 now (~20mg /kg).  No scheduled Vancomycin at this time - will follow dialysis plans.    Height: 5' 6.5" (168.9 cm) Weight: 82.6 kg (182 lb 1.6 oz) IBW/kg (Calculated) : 64.95  Temp (24hrs), Avg:98.4 F (36.9 C), Min:98.1 F (36.7 C), Max:99.2 F (37.3 C)  Recent Labs  Lab 11/18/19 1323 11/18/19 1600 11/18/19 1611 11/19/19 0342 11/19/19 0352 11/20/19 0353 11/20/19 1522 11/21/19 0410 11/21/19 1833 11/22/19 0317 11/22/19 1600 11/23/19 0441 11/23/19 1624 11/24/19 0524  WBC  --   --   --   --    < > 13.4*  --  17.1*  --  23.0*  --  28.1*  --  21.5*  CREATININE  --    < >  --  3.72*   < > 2.31*   < > 1.96*   < > 1.89* 1.73* 1.69* 2.61* 3.92*  LATICACIDVEN 2.0*  --  1.7 1.1  --   --   --   --   --   --   --   --   --   --    < > = values in this interval not displayed.    Estimated Creatinine Clearance: 19.1 mL/min (A) (by C-G formula based on SCr of 3.92 mg/dL (H)).    Allergies  Allergen Reactions  . Baclofen Other (See Comments)    Possibly stroke like symptoms  . Iodinated Diagnostic Agents Swelling, Rash and Other (See Comments)    Other Reaction: burning to mouth, swelling of lips  . Lipitor [Atorvastatin] Other (See Comments)    Leg pain  . Metoprolol Other (See Comments)    Headaches     Antimicrobials this admission: Vancomycin 4/9, 4/11>> 4/17; 4/29 >> Meropenem 4/11>> 4/17  Dose adjustments this admission:   Microbiology results: 3/29 COVID/flu: neg 3/31 MRSA: neg 4/8 sputum: normal flora 4/8 BCx: ngF 4/11 BCx: ngF 4/14 BCx: ngF 4/22 BCx: ngtd 4/23 TA: reincubated 4/23 cdiff: neg 4/28 Bcx: ngtd   Thank you for allowing pharmacy to be a part of this  patient's care.  Sloan Leiter, PharmD, BCPS, BCCCP Clinical Pharmacist Please refer to [facility] for [place] numbers 11/24/2019 5:41 PM

## 2019-11-24 NOTE — Progress Notes (Signed)
NAME:  COYLE STORDAHL, MRN:  [id number], DOB:  03/11/1956, LOS: 47 ADMISSION DATE:  10/24/2019, CONSULTATION DATE:  11/17/2020 REFERRING MD:  Kirk Ruths, CHIEF COMPLAINT:  Respiratory Arrest 4/23   Brief History   64 y/o male w/ h/o CAD, HTN, HLD, DM, ESRD.CABG x 1 1997, failed kidney transplant,end-stage renal disease on dialysis admitted 3/29  w/ CP, NSTEMI . He underwent  re-do CABG x 3  on 4/1. Post operative course c/b by cardiogenic shock, respiratory failure, VT,illius, and protein malnutrition. Pt. Had respiratory /  PEA arrest in CT on 4/23 requiring intubation. PCCM have been asked to consult  for respiratory failure/ ventilation management post arrest.  Past Medical History  CAD, HTN, HLD, DM, ESRD.  NSTEMI 10/25/2019 10/25/2019: LHC w/ severe CAD. Echo w/ reduced EF 35-40% and moderate AI. CABG Re-Do x 3 on 10/27/2019(SVG-LAD, SVG-PDA, SVG-OM).    RCA PCI 01/2019 Atrial Flutter s/p ablation 2018 CABG 1997 w / LIMA-LAD Combined systolic/ Diastolic Heart Failure Failed kidney transplant,end-stage renal disease on dialysis   Significant Hospital Events   3/29 Admission 4/1 CABG x 3 ( Re-do) 4/23 Respiratory / PEA  Arrest  4/23 Intubation and PCCM consult 4/27 extubated   Consults:  3/29 Renal Consult 3/30 TCTS Consult 4/23 PCCM  Procedures:  4/1 CABG x 3 (SVG-LAD, SVG-PDA, SVG-OM).  Significant Diagnostic Tests:  11/19/2019 [facility]  IMPRESSION: 1. Moderate to large bilateral pleural effusions are identified. The left pleural effusion appears partially loculated with loculated fluid along the paramediastinal left upper lobe. 2. There is compressive type atelectasis and consolidation associated with bilateral pleural effusions with considerable reduction in aerated lung volumes bilaterally. 3. Aortic atherosclerosis. IMPRESSION: Difficult images. There does not appear to be significant pericardial pathology.   11/18/2019 CXR Intubated, endotracheal tube tip  in good position. Otherwise stable lines and tubes. Improved lung volumes since 0907 hours today.  Small bilateral pleural effusions with compressive lung base atelectasis better demonstrated on the CT images today 1045 hours.  Echo 4/14 with EF 30-35%, stable aortic valve replacement.    Echo 4/21 with EF 45-50% and significant respirophasic septal variation.  Pericardium in some places appears thickened though windows are poor.  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  Cath 10/25/2019 . Severe 3 vessel CAD    - CTO of the mid LAD after the first diagonal    - 90% ostial LCx    - 100% mid RCA in stent. Left to right collaterals. 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 Atretic LIMA graft Mildly elevated LVEDP Early restenosis/occlusion of the RCA. This vessel is diffusely diseased and heavily calcified. The ostial LCx appears new compared to prior.  Percutaneous options are very limited Micro Data:  4/28 Blood >>  4/23 respiratory>> normal flora 4/22 blood>> NG 4/14 Blood>> No growth 4/11 Blood >> No Growth 4/8  Respiratory>> Normal Flora 4/8/ Blood >> No Growth 3/29 SARS Coronavirus 2 >> Negative Antimicrobials:  Vanc 4/11-4/17 Meropenem 4/11-4/17  Interim history/subjective:  Remains off TF since vomiting episode on 4/27. Still having abdominal pain. He stayed off NIV last night. His wife is concerned that he is sleepy during the day and more awake at night.  Objective   Blood pressure (!) 87/57, pulse 92, temperature 98.3 F (36.8 C), temperature source Oral, resp. rate (!) 29, height 5' 6.5" (1.689 m), weight 82.6 kg, SpO2 100 %. CVP:  [4 mmHg-5 mmHg] 5 mmHg      Intake/Output Summary (  Last 24 hours) at 11/24/2019 1025 Last data filed at 11/24/2019 0700 Gross per 24 hour  Intake 900.64 ml  Output 0 ml  Net 900.64 ml   Filed Weights   11/22/19 1618 11/23/19 0348 11/24/19 0600  Weight: 86.9 kg 83.9  kg 82.6 kg    Examination: General: Chronically ill-appearing man sitting up in bed watching TV, lethargic HENT: [place]/AT, eyes anicteric.  Oral mucosa moist. lungs: Breathing comfortably on 5 L nasal cannula, mild tachypnea, no accessory muscle use.  Clear to auscultation bilaterally. Cardiovascular: Regular rate and rhythm, paced rhythm Abdomen: Soft, diffusely tender to palpation, hypoactive bowel sounds Extremities: No clubbing, cyanosis, or edema.  Area of tenderness on left shin with associated warmth and swelling, no fluctuance Neuro: Awake, but periodically nodding off to sleep when not stimulated.  Moving all extremities, but globally weak.  Answering questions appropriately. CAM-ICU+ Skin: Warm, dry, well-healing incisions.  Warmth over left lower extremity shin, but no erythema  Resolved Hospital Problem list   Diarrhea   Assessment & Plan:  Acute hypoxic respiratory failure / PEA cardiac arrest likely due to respiratory decompensation while in CT 4/23 - appears to be multifactorial from pulmonary edema, bilateral pleural effusions (possibly loculated), suspected aspiration, undiagnosed OSA/OHS, and poor respiratory mechanics.  Left pleural effusion minimal on bedside ultrasound 4/26 & 4/28.  Extubated 4/27. -Continue nasal cannula, titrate down FiO2 as able. -Maintain euvolemia -If he becomes progressively more sedated throughout the day, need to check blood gas for hypercapnia.  May need NIV nightly to prevent hypoventilation.  Cardiogenic shock due to ischemic cardiomyopathy, chronic HFrEF, history of paroxysmal Afib -Continue amiodarone -Continue Eliquis twice daily -Continue Plavix and statin.  Intolerant to beta-blocker and other guideline directed therapy due to hypotension. -Continue to monitor on telemetry -Continue vasopressors as required to maintain MAP greater than 65 and appropriate cardiac output  Elevated LFTs-improving.  No evidence of gallbladder  pathology. -Continue rosuvastatin and amiodarone -Continue to monitor periodically  Post-arrest leukocytosis-improved today.  Remains normothermic.  All culture data remains negative.  -May start antibiotics for skin & soft tissue infection overlying the shin. Will discuss with primary.  Abdominal pain due to ileus -Avoid opiates -Out of bed mobility as able -May benefit from restarting trickle tube feeds cautiously.   ESRD with post-arrest hyperkalemia  - Appreciate nephrology's input, transitioning to iHD  - Renally dose medications - Strict I/O  Hyperglycemia, improved  -Continue Accu-Cheks with sliding scale insulin as needed -Agree with long-acting insulin -Goal BG 140-180 while admitted to the ICU  Acute on chronic anemia of renal disease - stable  -Continue to monitor periodically -Transfuse for hemoglobin less than 8 or hemodynamically significant bleeding. -Continue Aranesp per nephrology   Best practice:  Diet: TF  Pain/Anxiety/Delirium protocol (if indicated): N/A VAP protocol (if indicated): N/A DVT prophylaxis: heparin GI prophylaxis: Protonix Glucose control: CBG with SSI Mobility: BR Code Status: Full Family Communication: Wife updated at bedside Disposition: ICU  Labs   CBC: Recent Labs  Lab 11/20/19 0353 11/20/19 0353 11/20/19 0626 11/21/19 0410 11/22/19 0317 11/23/19 0441 11/24/19 0524  WBC 13.4*  --   --  17.1* 23.0* 28.1* 21.5*  NEUTROABS  --   --   --   --   --  23.8*  --   HGB 8.1*   < > 8.5* 8.4* 8.7* 8.8* 8.2*  HCT 26.9*   < > 25.0* 27.9* 29.2* 29.9* 27.7*  MCV 96.1  --   --  96.9 97.0 96.8 97.2  PLT 213  --   --  233 267 267 273   < > = values in this interval not displayed.    Basic Metabolic Panel: Recent Labs  Lab 11/20/19 0353 11/20/19 0626 11/21/19 0410 11/21/19 1833 11/22/19 0317 11/22/19 1600 11/23/19 0441 11/23/19 1624 11/24/19 0524  NA 134*   < > 133*   < > 133* 134* 135 133* 134*  K 4.8   < > 4.6   < > 4.3 4.2  4.4 4.2 4.1  CL 102   < > 100   < > 100 100 99 100 102  CO2 26   < > 27   < > 24 27 25 24 22   GLUCOSE 251*   < > 239*   < > 175* 149* 176* 191* 193*  BUN 36*   < > 31*   < > 33* 30* 31* 50* 73*  CREATININE 2.31*   < > 1.96*   < > 1.89* 1.73* 1.69* 2.61* 3.92*  CALCIUM 7.2*   < > 7.2*   < > 7.4* 7.4* 7.7* 7.1* 6.9*  MG 2.4  --  2.3  --  2.4  --  2.4  --  2.5*  PHOS 2.4*   < > 1.9*   < > 2.0* 1.9* 2.5 2.9 3.1   < > = values in this interval not displayed.   GFR: Estimated Creatinine Clearance: 19.1 mL/min (A) (by C-G formula based on SCr of 3.92 mg/dL (H)). Recent Labs  Lab 11/18/19 1323 11/18/19 1611 11/19/19 0342 11/19/19 1544 11/21/19 0410 11/22/19 0317 11/23/19 0441 11/24/19 0524  WBC  --   --   --    < > 17.1* 23.0* 28.1* 21.5*  LATICACIDVEN 2.0* 1.7 1.1  --   --   --   --   --    < > = values in this interval not displayed.    Liver Function Tests: Recent Labs  Lab 11/19/19 0342 11/19/19 0352 11/20/19 0353 11/20/19 1522 11/21/19 0410 11/21/19 1833 11/22/19 0317 11/22/19 0317 11/22/19 1600 11/23/19 0441 11/23/19 1157 11/23/19 1624 11/24/19 0524  AST 86*  --  251*  --  131*  --  73*  --   --   --  44*  --   --   ALT 101*  --  189*  --  156*  --  121*  --   --   --  81*  --   --   ALKPHOS 198*  --  264*  --  324*  --  319*  --   --   --  253*  --   --   BILITOT 1.6*  --  1.4*  --  1.9*  --  1.6*  --   --   --  1.8*  --   --   PROT 8.7*  --  8.5*  --  8.3*  --  9.1*  --   --   --  8.1  --   --   ALBUMIN 1.7*   < > 1.7*  1.6*   < > 1.7*  1.7*   < > 1.8*  1.8*   < > 1.8* 1.8* 1.7* 1.7* 1.5*   < > = values in this interval not displayed.   Recent Labs  Lab 11/18/19 1101 11/23/19 1157  LIPASE 83* 28   No results for input(s): AMMONIA in the last 168 hours.  ABG    Component Value Date/Time   PHART 7.375 11/20/2019 9417  PCO2ART 39.8 11/20/2019 0626   PO2ART 103 11/20/2019 0626   HCO3 23.4 11/20/2019 0626   TCO2 25 11/20/2019 0626   ACIDBASEDEF 2.0  11/20/2019 0626   O2SAT 77.6 11/24/2019 0526     Coagulation Profile: Recent Labs  Lab 11/18/19 1101  INR 2.6*    Cardiac Enzymes: Recent Labs  Lab 11/20/19 1522  CKTOTAL 795*    HbA1C: Hemoglobin A1C  Date/Time Value Ref Range Status  06/07/2019 10:54 AM 6.4 (A) 4.0 - 5.6 % Final   Hgb A1c MFr Bld  Date/Time Value Ref Range Status  10/27/2019 04:51 AM 6.2 (H) 4.8 - 5.6 % Final    Comment:    (NOTE) Pre diabetes:          5.7%-6.4% Diabetes:              >6.4% Glycemic control for   <7.0% adults with diabetes   08/20/2018 07:23 AM 6.9 (H) 4.8 - 5.6 % Final    Comment:    (NOTE) Pre diabetes:          5.7%-6.4% Diabetes:              >6.4% Glycemic control for   <7.0% adults with diabetes     CBG: Recent Labs  Lab 11/23/19 1635 11/23/19 2109 11/24/19 0033 11/24/19 0626 11/24/19 0741  GLUCAP 311* 157* 174* 167* 171*    This patient is critically ill with multiple organ system failure which requires frequent high complexity decision making, assessment, support, evaluation, and titration of therapies. This was completed through the application of advanced monitoring technologies and extensive interpretation of multiple databases. During this encounter critical care time was devoted to patient care services described in this note for 40 minutes.  Julian Hy, DO 11/24/19 10:44 AM [facility]

## 2019-11-24 NOTE — Progress Notes (Signed)
Patient resting comfortably on 2L [place] with no respiratory distress noted. BIPAP not needed at this time. RT will monitor as needed. 

## 2019-11-25 ENCOUNTER — Encounter (HOSPITAL_COMMUNITY): Payer: Self-pay | Admitting: Anesthesiology

## 2019-11-25 ENCOUNTER — Encounter (HOSPITAL_COMMUNITY)
Admission: EM | Disposition: A | Discharge status: Nursing Home (Includes ICF) | Payer: Self-pay | Source: Home / Self Care | Attending: Cardiothoracic Surgery

## 2019-11-25 ENCOUNTER — Inpatient Hospital Stay (HOSPITAL_COMMUNITY): Payer: Medicare HMO

## 2019-11-25 DIAGNOSIS — J9601 Acute respiratory failure with hypoxia: Secondary | ICD-10-CM | POA: Diagnosis not present

## 2019-11-25 DIAGNOSIS — N186 End stage renal disease: Secondary | ICD-10-CM | POA: Diagnosis not present

## 2019-11-25 DIAGNOSIS — R570 Cardiogenic shock: Secondary | ICD-10-CM | POA: Diagnosis not present

## 2019-11-25 DIAGNOSIS — I214 Non-ST elevation (NSTEMI) myocardial infarction: Secondary | ICD-10-CM | POA: Diagnosis not present

## 2019-11-25 DIAGNOSIS — I255 Ischemic cardiomyopathy: Secondary | ICD-10-CM | POA: Diagnosis not present

## 2019-11-25 LAB — CBC
HCT: 26.3 % — ABNORMAL LOW (ref 39.0–52.0)
Hemoglobin: 7.9 g/dL — ABNORMAL LOW (ref 13.0–17.0)
MCH: 29.3 pg (ref 26.0–34.0)
MCHC: 30 g/dL (ref 30.0–36.0)
MCV: 97.4 fL (ref 80.0–100.0)
Platelets: 254 10*3/uL (ref 150–400)
RBC: 2.7 MIL/uL — ABNORMAL LOW (ref 4.22–5.81)
RDW: 21 % — ABNORMAL HIGH (ref 11.5–15.5)
WBC: 18 10*3/uL — ABNORMAL HIGH (ref 4.0–10.5)
nRBC: 0.2 % (ref 0.0–0.2)

## 2019-11-25 LAB — BASIC METABOLIC PANEL
Anion gap: 16 — ABNORMAL HIGH (ref 5–15)
BUN: 112 mg/dL — ABNORMAL HIGH (ref 8–23)
CO2: 18 mmol/L — ABNORMAL LOW (ref 22–32)
Calcium: 6.6 mg/dL — ABNORMAL LOW (ref 8.9–10.3)
Chloride: 101 mmol/L (ref 98–111)
Creatinine, Ser: 6.56 mg/dL — ABNORMAL HIGH (ref 0.61–1.24)
GFR calc Af Amer: 9 mL/min — ABNORMAL LOW (ref >60)
GFR calc non Af Amer: 8 mL/min — ABNORMAL LOW (ref >60)
Glucose, Bld: 158 mg/dL — ABNORMAL HIGH (ref 70–99)
Potassium: 4.6 mmol/L (ref 3.5–5.1)
Sodium: 135 mmol/L (ref 135–145)

## 2019-11-25 LAB — GLUCOSE, CAPILLARY
Glucose-Capillary: 157 mg/dL — ABNORMAL HIGH (ref 70–99)
Glucose-Capillary: 131 mg/dL — ABNORMAL HIGH (ref 70–99)
Glucose-Capillary: 148 mg/dL — ABNORMAL HIGH (ref 70–99)
Glucose-Capillary: 142 mg/dL — ABNORMAL HIGH (ref 70–99)
Glucose-Capillary: 149 mg/dL — ABNORMAL HIGH (ref 70–99)

## 2019-11-25 LAB — COOXEMETRY PANEL
Carboxyhemoglobin: 2.1 % — ABNORMAL HIGH (ref 0.5–1.5)
Methemoglobin: 1.4 % (ref 0.0–1.5)
O2 Saturation: 69.5 %
Total hemoglobin: 5.4 g/dL — CL (ref 12.0–16.0)

## 2019-11-25 SURGERY — EXPLORATION POST OPERATIVE OPEN HEART
Anesthesia: General | Site: Chest

## 2019-11-25 MED ORDER — CHLORHEXIDINE GLUCONATE CLOTH 2 % EX PADS
6.0000 | MEDICATED_PAD | Freq: Every day | CUTANEOUS | Status: DC
  Administered 2019-11-26 – 2019-12-12 (×13): 6 via TOPICAL

## 2019-11-25 MED ORDER — ORAL CARE MOUTH RINSE
15.0000 mL | Freq: Three times a day (TID) | OROMUCOSAL | Status: DC
  Administered 2019-11-25 – 2019-11-28 (×3): 15 mL via OROMUCOSAL

## 2019-11-25 NOTE — Progress Notes (Signed)
29 Days Post-Op Procedure(s) (LRB): REDO CORONARY ARTERY BYPASS GRAFTING TIMES 3  USING LEFT GREATER SAPHENOUS LEG VEIN HARVESTED ENDOSCOPICALLY(CABG) (N/A) TRANSESOPHAGEAL ECHOCARDIOGRAM (TEE) (N/A) AORTIC VALVE REPLACEMENT (AVR) USING EDWARDS INTUITY 23 MM AORTIC VALVE. (N/A) Endovein Harvest Of Greater Saphenous Vein (Left) Subjective: Feeling ok  Objective: Vital signs in last 24 hours: Temp:  [97.8 F (36.6 C)-98.1 F (36.7 C)] 98 F (36.7 C) (04/30 1500) Pulse Rate:  [91-118] 92 (04/30 1500) Cardiac Rhythm: A-V Sequential paced (04/30 1200) Resp:  [14-30] 23 (04/30 1500) BP: (89-126)/(43-88) 118/67 (04/30 1500) SpO2:  [73 %-100 %] 93 % (04/30 1500) Weight:  [83.7 kg] 83.7 kg (04/30 0643)  Hemodynamic parameters for last 24 hours: CVP:  [0 mmHg-14 mmHg] 2 mmHg  Intake/Output from previous day: 04/29 0701 - 04/30 0700 In: 1115.4 [I.V.:599.6; NG/GT:30; IV Piggyback:455.8] Out: 100 [Stool:100] Intake/Output this shift: Total I/O In: 164.5 [I.V.:164.5] Out: -   General appearance: alert and cooperative Neurologic: intact Heart: regular rate and rhythm, S1, S2 normal, no murmur, click, rub or gallop Lungs: clear to auscultation bilaterally Wound: staples removed from chest, purulent material encountered at lower 1/3 of sternum  The wound is opened and packed with saline gauze  Lab Results: Recent Labs    11/24/19 0524 11/25/19 0429  WBC 21.5* 18.0*  HGB 8.2* 7.9*  HCT 27.7* 26.3*  PLT 273 254   BMET:  Recent Labs    11/24/19 0524 11/25/19 0750  NA 134* 135  K 4.1 4.6  CL 102 101  CO2 22 18*  GLUCOSE 193* 158*  BUN 73* 112*  CREATININE 3.92* 6.56*  CALCIUM 6.9* 6.6*    PT/INR: No results for input(s): LABPROT, INR in the last 72 hours. ABG    Component Value Date/Time   PHART 7.375 11/20/2019 0626   HCO3 23.4 11/20/2019 0626   TCO2 25 11/20/2019 0626   ACIDBASEDEF 2.0 11/20/2019 0626   O2SAT 69.5 11/25/2019 0430   CBG (last 3)  Recent Labs   11/25/19 0834 11/25/19 1312 11/25/19 1544  GLUCAP 131* 148* 142*    Assessment/Plan: S/P Procedure(s) (LRB): REDO CORONARY ARTERY BYPASS GRAFTING TIMES 3  USING LEFT GREATER SAPHENOUS LEG VEIN HARVESTED ENDOSCOPICALLY(CABG) (N/A) TRANSESOPHAGEAL ECHOCARDIOGRAM (TEE) (N/A) AORTIC VALVE REPLACEMENT (AVR) USING EDWARDS INTUITY 23 MM AORTIC VALVE. (N/A) Endovein Harvest Of Greater Saphenous Vein (Left) likely superficial sternal infection but no evidence for bony involvement.  Gram  Stain/culture pending   LOS: 32 days    Patrick [name] 11/25/2019

## 2019-11-25 NOTE — Progress Notes (Addendum)
Patient ID: Patrick [name], male   DOB: [birth year], 64 y.o.   MRN: [id number]     Advanced Heart Failure Rounding Note  PCP-Cardiologist: Dorris Carnes, MD     Patient Profile   64 y/o male w/ h/o CAD s/p remote CABG in 1997 w/ LIMA-LAD, failed kidney transplant, end-stage renal disease on dialysis admitted w/ CP. LHC w/ severe CAD. Echo w/ reduced EF 35-40% and moderate AI. S/p redo CABG x 3 on 4/1 (SVG-LAD, SVG-PDA, SVG-OM) + bioprosthetic AVR. Post operative course c/b by cardiogenic shock, respiratory failure, VT, and protein malnutrition.    Subjective:    Patient had respiratory arrest with PEA while in CT scanner 4/23, had 5 minutes CPR with drugs, achieved ROSC and was intubated.  K noted > 7.  He was extubated on 4/27.   CVVH stopped 4/28. Remains anuric but not volume overloaded. CVP 0-2.  K 4.6.   Remains on NE 12 mcg + VP 0.03. MAPs in the 80s. Co-ox 70%  No cardiac complaints. Just feels tired after working w/ PT. Making progress w/ therapy. Able to stand today and ambulate some.    Objective:   Weight Range: 83.7 kg Body mass index is 29.34 kg/m.   Vital Signs:   Temp:  [97.8 F (36.6 C)-99.2 F (37.3 C)] 97.8 F (36.6 C) (04/30 0337) Pulse Rate:  [53-105] 92 (04/30 0930) Resp:  [14-30] 26 (04/30 0930) BP: (82-126)/(50-88) 121/70 (04/30 0930) SpO2:  [77 %-100 %] 96 % (04/30 0930) Weight:  [83.7 kg] 83.7 kg (04/30 0643) Last BM Date: 11/24/19  Weight change: Filed Weights   11/23/19 0348 11/24/19 0600 11/25/19 0643  Weight: 83.9 kg 82.6 kg 83.7 kg    Intake/Output:   Intake/Output Summary (Last 24 hours) at 11/25/2019 0940 Last data filed at 11/25/2019 0900 Gross per 24 hour  Intake 1134.05 ml  Output --  Net 1134.05 ml      Physical Exam    CVP 0-2 General:   Weak appearing AMM, no respiratory difficulty  HEENT: + [place]rak  Neck: supple. no JVD. Carotids 2+ bilat; no bruits. No lymphadenopathy or thryomegaly appreciated. Cor: PMI nondisplaced.  Regular rate & rhythm. No rubs, gallops or murmurs. L subclavian HF catheter R subclavian central line  Lungs: CTAB, no wheezing  Abdomen: soft, nontender, nondistended. No hepatosplenomegaly. No bruits or masses. Good bowel sounds. Extremities: no cyanosis, clubbing, rash, edema Neuro: alert & orientedx2, cranial nerves grossly intact. moves all 4 extremities w/o difficulty. Affect flat    Telemetry   A paced 90s   Labs    CBC Recent Labs    11/23/19 0441 11/23/19 0441 11/24/19 0524 11/25/19 0429  WBC 28.1*   < > 21.5* 18.0*  NEUTROABS 23.8*  --   --   --   HGB 8.8*   < > 8.2* 7.9*  HCT 29.9*   < > 27.7* 26.3*  MCV 96.8   < > 97.2 97.4  PLT 267   < > 273 254   < > = values in this interval not displayed.   Basic Metabolic Panel Recent Labs    11/23/19 0441 11/23/19 0441 11/23/19 1624 11/23/19 1624 11/24/19 0524 11/25/19 0750  NA 135   < > 133*   < > 134* 135  K 4.4   < > 4.2   < > 4.1 4.6  CL 99   < > 100   < > 102 101  CO2 25   < > 24   < >  22 18*  GLUCOSE 176*   < > 191*   < > 193* 158*  BUN 31*   < > 50*   < > 73* 112*  CREATININE 1.69*   < > 2.61*   < > 3.92* PENDING  CALCIUM 7.7*   < > 7.1*   < > 6.9* 6.6*  MG 2.4  --   --   --  2.5*  --   PHOS 2.5   < > 2.9  --  3.1  --    < > = values in this interval not displayed.   Liver Function Tests Recent Labs    11/23/19 1157 11/23/19 1157 11/23/19 1624 11/24/19 0524  AST 44*  --   --   --   ALT 81*  --   --   --   ALKPHOS 253*  --   --   --   BILITOT 1.8*  --   --   --   PROT 8.1  --   --   --   ALBUMIN 1.7*   < > 1.7* 1.5*   < > = values in this interval not displayed.   Recent Labs    11/23/19 1157  LIPASE 28   Cardiac Enzymes No results for input(s): CKTOTAL, CKMB, CKMBINDEX, TROPONINI in the last 72 hours.  BNP: BNP (last 3 results) Recent Labs    11/20/19 0353 11/21/19 0410  BNP 374.6* 519.5*    ProBNP (last 3 results) No results for input(s): PROBNP in the last 8760  hours.   D-Dimer No results for input(s): DDIMER in the last 72 hours. Hemoglobin A1C No results for input(s): HGBA1C in the last 72 hours. Fasting Lipid Panel No results for input(s): CHOL, HDL, LDLCALC, TRIG, CHOLHDL, LDLDIRECT in the last 72 hours. Thyroid Function Tests No results for input(s): TSH, T4TOTAL, T3FREE, THYROIDAB in the last 72 hours.  Invalid input(s): FREET3  Other results:   Imaging    No results found.   Medications:     Scheduled Medications: . amiodarone  200 mg Per Tube BID  . apixaban  5 mg Oral BID  . chlorhexidine gluconate (MEDLINE KIT)  15 mL Mouth Rinse BID  . Chlorhexidine Gluconate Cloth  6 each Topical Daily  . clopidogrel  75 mg Per Tube Daily  . darbepoetin (ARANESP) injection - NON-DIALYSIS  60 mcg Subcutaneous Q Sat-1800  . feeding supplement (PRO-STAT SUGAR FREE 64)  60 mL Per Tube BID  . insulin aspart  0-9 Units Subcutaneous Q4H  . insulin glargine  6 Units Subcutaneous Daily  . mouth rinse  15 mL Mouth Rinse 10 times per day  . midodrine  20 mg Per Tube TID WC  . multivitamin  1 tablet Per Tube QHS  . pantoprazole sodium  40 mg Per Tube Daily  . rosuvastatin  10 mg Per Tube q1800  . sodium chloride flush  10-40 mL Intracatheter Q12H  . vancomycin variable dose per unstable renal function (pharmacist dosing)   Does not apply See admin instructions    Infusions: . sodium chloride Stopped (11/21/19 1749)  . sodium chloride    . sodium chloride    . cefTRIAXone (ROCEPHIN)  IV Stopped (11/24/19 1843)  . feeding supplement (VITAL 1.5 CAL) Stopped (11/22/19 1457)  . norepinephrine (LEVOPHED) Adult infusion 12 mcg/min (11/25/19 0900)  . vasopressin (PITRESSIN) infusion - *FOR SHOCK* 0.03 Units/min (11/25/19 0900)    PRN Medications: sodium chloride, Place/Maintain arterial line **AND** sodium chloride, Place/Maintain arterial line **AND**  sodium chloride, acetaminophen (TYLENOL) oral liquid 160 mg/5 mL, dextrose,  diphenhydrAMINE, fentaNYL (SUBLIMAZE) injection, Gerhardt's butt cream, heparin, levalbuterol, lip balm, metoprolol tartrate, ondansetron (ZOFRAN) IV, oxyCODONE, phenol, sodium chloride flush   Assessment/Plan   1. Cardiogenic shock: Post-op.  Echo 4/14 with EF 30-35%, stable aortic valve replacement.  Echo on 4/21 with EF 45-50% and significant respirophasic septal variation.  Pericardium in some places appears thickened though windows are poor.  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 CT chest 4/23, difficult images as the patient became unstable during the scan. There did not appear to be significant pericardial pathology. S/p PEA arrest 4/23.  CVP low <2. Pressors remains soft.  Now on NE 12, VP 0.03, midodrine 20 tid.  CO-OX 70%. Continue to wean NE as tolerated.   2. ESRD:  CVVHD stopped 4/28. Anuric. Nephrology to decide restart of HD. No immediate indication given stable volume status and K.  3. Possible aspiration PNA: He has completed empiric vanco/meropenem. AF. WBC trending down, 28>>21>>18 4. Atrial fibrillation: Paroxysmal.  A Paced 90s .  - Continue amiodarone 200 mg bid for now. - Continue apixaban 5 mg bid.  5. Anemia, post-op: Hgb 7.9 today. Monitor closely, transfuses for hgb <7.0  6. CAD: Redo CABG x 3 this admission on 4/1 (SVG-LAD, SVG-PDA, SVG-OM).   - Continue Plavix and Crestor.  7. VT:  Frequent NSVT, now on Amiodarone with improvement. 8 Acute hypoxic respiratory failure: due to pulmonary edema and aspiration PNA.  Re-intubated after PEA arrest 4/23. Extubated again on 4/27.  Has pleural effusions but did not appear amenable to thoracentesis per CCM.  9. ID: Afebrile.  Cultures NGTD. C diff negative. Sputum GS 4/23. Rare GNR.  10. PEA arrest on 4/23.  Resuscitated with CPR.  Suspect due to hypoxemia.  11. Elevated LFTs: ?Shock liver.  RUQ US unremarkable.  - Trending down. 12. Severe Deconditioning - PT/OT following.  Recommending CIR.   13. Ileus: Tube feeds held.   Length of Stay: [address] PA-C  11/25/2019, 9:40 AM  Advanced Heart Failure Team Pager [phone] (M-F; 7a - 4p)   Patient seen with PA, agree with the above note.   Gradually decreasing pressor requirement, now down to NE 4 and vasopressin 0.03.  He is on midodrine 20 mg tid. Stable co-ox.  He remains a-paced.  CVP remains low.   No need for iHD yet, reassess tomorrow.    He was able to stand and walk a few steps.    Slow progress. Continue to wean pressors over the weekend as able.  I will see him again Monday unless called.   Loralie Champagne 11/25/2019 4:09 PM

## 2019-11-25 NOTE — Progress Notes (Signed)
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 Pt has been on CRRT but removed 4/11 due to hypotension and VTach with return to CRRT 4/12 pt with ileus. 4/23  While patient undergoing gated CT (to further evaluate pericardium) he had a PEA arrest. CPR done for approximately 3 minutes with ROSC and patient intubated.  PMHx: ESRD MWF, HTN, PNA, T2DM, MI.   OT comments  Pt able to progress to EOB and stand today with use of Stedy. Sat EOB @ 5 min with S before completing standing with +2 Max x 2. Able to stand @ 30 seconds first trial then briefly second trail to replace bed pad. BP supine 114/88; sitting 114/74; after session supine 120/70. Increased O2 during session due to desat into low 80s with increased WOB. Rebound to 90 in less than 1 min with pursed lip breathing and rest. Wife present at end of session and encouraged with progress. Given signitificant weakness., recommend rehab initially at [facility]. Will continue to follow acutely.   Follow Up Recommendations  LTACH;Supervision/Assistance - 24 hour    Equipment Recommendations  3 in 1 bedside commode;Other (comment)    Recommendations for Other Services      Precautions / Restrictions Precautions Precautions: Fall;Sternal;Other (comment)(pacing wires; rectal foley) Required Braces or Orthoses: Other Brace(B orthotic shoes) Restrictions Weight Bearing Restrictions: Yes- sternal precautions       Mobility Bed Mobility Overal bed mobility: Needs Assistance Bed Mobility: Supine to Sit;Sit to Supine     Supine to sit: Max assist;+2 for physical assistance Sit to supine: Total assist;+2 for physical assistance   General bed mobility comments: Pt assisting with helping to move legs adn reaching with arms; increased assistance provided to conserve energy for  stand and to maintian sternal precautions  Transfers Overall transfer level: Needs assistance   Transfers: Sit to/from Stand Sit to Stand: Max assist;+2 physical assistance         General transfer comment: Max A for transition then able to maintain in semi stand position with mod A +2 x 30 seconds 1rst trial then briefly second trial to change bed pad then reutrn to supine    Balance     Sitting balance-Leahy Scale: Fair Sitting balance - Comments: Able to sit unsupported EOB at least 5 min before stand     Standing balance-Leahy Scale: Poor Standing balance comment: stood in [place] unable to achieve full upright posture                           ADL either performed or assessed with clinical judgement   ADL Overall ADL's : Needs assistance/impaired     Grooming: Set up;Wash/dry hands;Wash/dry face;Oral care   Upper Body Bathing: Sitting;Bed level;Moderate assistance                           Functional mobility during ADLs: Maximal assistance;+2 for physical assistance(with use of Stedya dn bed pad) General ADL Comments: wife states she is encouraging her husband to complete groomiing and other ADL as he can     Vision       Perception     Praxis      Cognition Arousal/Alertness: Awake/alert Behavior During Therapy: WFL for tasks assessed/performed Overall Cognitive Status: Impaired/Different from baseline  General Comments: slow processing but appears to be returning to baseline; wife staes that he appeasr to be OK        Exercises Other Exercises Other Exercises: encouraged wife to complete incentive spirometer with him Other Exercises: wife completing BUE shoulder flexion/elbow flex/ext and B grip strengthening "within the tube" Other Exercises: continue to place wedge laterally of LLE to decrease ER and encourage terminal knee extension   Shoulder Instructions       General  Comments      Pertinent Vitals/ Pain       Pain Assessment: Faces Faces Pain Scale: Hurts little more Pain Location: LLE pain Pain Descriptors / Indicators: Discomfort;Guarding;Grimacing;Sore Pain Intervention(s): Limited activity within patient's tolerance  Home Living                                          Prior Functioning/Environment              Frequency  Min 2X/week        Progress Toward Goals  OT Goals(current goals can now be found in the care plan section)  Progress towards OT goals: Progressing toward goals  Acute Rehab OT Goals Patient Stated Goal: per wife for pt to get well and go home OT Goal Formulation: With patient/family Time For Goal Achievement: 12/05/19 Potential to Achieve Goals: Good ADL Goals Pt Will Perform Grooming: with min guard assist;sitting Pt Will Perform Lower Body Dressing: with mod assist;sitting/lateral leans;sit to/from stand Pt Will Transfer to Toilet: with mod assist;stand pivot transfer;bedside commode Pt Will Perform Toileting - Clothing Manipulation and hygiene: with mod assist;sitting/lateral leans;sit to/from stand;with adaptive equipment Pt/caregiver will Perform Home Exercise Program: Increased ROM;With written HEP provided Additional ADL Goal #1: Pt will tolerate x10 mins of ADL tasks with O2 sats >90% with 1 rest break in order to increase activity tolerance.  Plan Discharge plan needs to be updated    Co-evaluation    PT/OT/SLP Co-Evaluation/Treatment: Yes Reason for Co-Treatment: Complexity of the patient's impairments (multi-system involvement);To address functional/ADL transfers;For patient/therapist safety   OT goals addressed during session: ADL's and self-care;Strengthening/ROM      AM-PAC OT "6 Clicks" Daily Activity     Outcome Measure   Help from another person eating meals?: Total Help from another person taking care of personal grooming?: A Lot Help from another person  toileting, which includes using toliet, bedpan, or urinal?: Total Help from another person bathing (including washing, rinsing, drying)?: A Lot Help from another person to put on and taking off regular upper body clothing?: Total Help from another person to put on and taking off regular lower body clothing?: Total 6 Click Score: 8    End of Session Equipment Utilized During Treatment: Oxygen  OT Visit Diagnosis: Unsteadiness on feet (R26.81);Other abnormalities of gait and mobility (R26.89);Muscle weakness (generalized) (M62.81);Pain;Other symptoms and signs involving cognitive function Pain - Right/Left: Left Pain - part of body: Leg   Activity Tolerance Patient tolerated treatment well   Patient Left in bed;with call bell/phone within reach;with family/visitor present;with SCD's reapplied   Nurse Communication Mobility status        Time: 0258-5277 OT Time Calculation (min): 32 min  Charges: OT General Charges $OT Visit: 1 Visit OT Treatments $Therapeutic Activity: 8-22 mins  Maurie Boettcher, OT/L   Acute OT Clinical Specialist [place] Pager [phone] Office [phone]    [facility] 11/25/2019,  10:00 AM

## 2019-11-25 NOTE — Progress Notes (Signed)
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 Pt has been on CRRT but removed 4/11 due to hypotension and VTach with return to CRRT 4/12 pt with ileus. 4/23  While patient undergoing gated CT (to further evaluate pericardium) he had a PEA arrest. CPR done for approximately 3 minutes with ROSC and patient intubated.  PMHx: ESRD MWF, HTN, PNA, T2DM, MI.    PT Comments    Pt tolerating incr activity. Fatigues quickly but motivated to participate. Agree with plan for LTACH.    Follow Up Recommendations  LTACH     Equipment Recommendations  Other (comment)(To be determined)    Recommendations for Other Services       Precautions / Restrictions Precautions Precautions: Fall;Sternal;Other (comment)(pacing wires; rectal foley) Required Braces or Orthoses: Other Brace(B orthotic shoes) Restrictions Weight Bearing Restrictions: Yes RUE Weight Bearing: Partial weight bearing RUE Partial Weight Bearing Percentage or Pounds: Sternal Precautions LUE Weight Bearing: Partial weight bearing LUE Partial Weight Bearing Percentage or Pounds: Sternal Precautions    Mobility  Bed Mobility Overal bed mobility: Needs Assistance Bed Mobility: Supine to Sit;Sit to Supine     Supine to sit: Max assist;+2 for physical assistance Sit to supine: Total assist;+2 for physical assistance   General bed mobility comments: Pt assisting with helping to move legs adn reaching with arms; increased assistance provided to conserve energy for stand and to maintian sternal precautions  Transfers Overall transfer level: Needs assistance Equipment used: Ambulation equipment used Transfers: Sit to/from Stand Sit to Stand: Max assist;+2 physical assistance         General transfer comment: Assist to bring hips up using bed pad to lift. Pt in  flexed position and unable to fully extend trunk and hips.   Ambulation/Gait                 Stairs             Wheelchair Mobility    Modified Rankin (Stroke Patients Only)       Balance   Sitting-balance support: Feet supported Sitting balance-Leahy Scale: Fair Sitting balance - Comments: Able to sit unsupported EOB at least 5 min before stand   Standing balance support: Bilateral upper extremity supported Standing balance-Leahy Scale: Poor Standing balance comment: +2 mod assist to maintain flexed standing posture with stedy. Stood x 30 sec. Stood second time for 5-10 seconds to change bed pad and sheets                            Cognition Arousal/Alertness: Awake/alert Behavior During Therapy: WFL for tasks assessed/performed Overall Cognitive Status: Impaired/Different from baseline                                 General Comments: slow processing but appears to be returning to baseline; wife staes that he appeasr to be OK      Exercises Other Exercises Other Exercises: encouraged wife to complete incentive spirometer with him Other Exercises: wife completing BUE shoulder flexion/elbow flex/ext and B grip strengthening "within the tube" Other Exercises: continue to place wedge laterally of LLE to decrease ER and encourage terminal knee extension    General Comments General comments (skin integrity, edema, etc.): Initially pt on  2L of O2 with SpO2 92-93%. With activity pt to 78% SpO2. Incr O2 to 5L with SpO2 returning to 90%.       Pertinent Vitals/Pain Pain Assessment: Faces Faces Pain Scale: Hurts little more Pain Location: LLE pain Pain Descriptors / Indicators: Discomfort;Guarding;Grimacing;Sore Pain Intervention(s): Limited activity within patient's tolerance;Repositioned    Home Living                      Prior Function            PT Goals (current goals can now be found in the care plan section)  Acute Rehab PT Goals Patient Stated Goal: per wife for pt to get well and go home Progress towards PT goals: Progressing toward goals    Frequency    Min 2X/week      PT Plan Discharge plan needs to be updated;Frequency needs to be updated    Co-evaluation PT/OT/SLP Co-Evaluation/Treatment: Yes Reason for Co-Treatment: Complexity of the patient's impairments (multi-system involvement);For patient/therapist safety PT goals addressed during session: Mobility/safety with mobility;Balance OT goals addressed during session: ADL's and self-care;Strengthening/ROM      AM-PAC PT "6 Clicks" Mobility   Outcome Measure  Help needed turning from your back to your side while in a flat bed without using bedrails?: Total Help needed moving from lying on your back to sitting on the side of a flat bed without using bedrails?: Total Help needed moving to and from a bed to a chair (including a wheelchair)?: Total Help needed standing up from a chair using your arms (e.g., wheelchair or bedside chair)?: Total Help needed to walk in hospital room?: Total Help needed climbing 3-5 steps with a railing? : Total 6 Click Score: 6    End of Session Equipment Utilized During Treatment: Oxygen Activity Tolerance: Patient limited by fatigue Patient left: in bed;with call bell/phone within reach;with family/visitor present Nurse Communication: Mobility status;Need for lift equipment PT Visit Diagnosis: Muscle weakness (generalized) (M62.81);Other abnormalities of gait and mobility (R26.89)     Time: 6122-4497 PT Time Calculation (min) (ACUTE ONLY): 27 min  Charges:  $Therapeutic Activity: 8-22 mins                     [place] Pager [phone] Office [place] 11/25/2019, 11:27 AM

## 2019-11-25 NOTE — Progress Notes (Signed)
Fredrich Romans MD removed sternal incision staples at bedside.  Wound noted to have eschar on the lower portions of the wound.  Staples removed from eschar and debrided by MD at this time.  Wound swabbed and sent off for cultures, wound packed with wet gauze.  Wound covered with gauze and tape.  Will continue to monitor for drainage. Patient tolerated procedure well.

## 2019-11-25 NOTE — Progress Notes (Signed)
Patient ID: Patrick [name], male   DOB: 08-Nov-1955, 64 y.o.   MRN: [id number] [facility] Progress Note   Assessment/ Plan:   1.  Redo coronary artery bypass grafting complicated by cardiogenic shock: Patient with history of coronary artery disease/NSTEMI.  Remains on pressors that potentially may be able to wean down based on the current MAP; also on midodrine 20 mg 3 times daily. 2. ESRD: Previously on CRRT due to hemodynamic instability-this was stopped 48 hours ago after successful ultrafiltration.  I will check labs today to decide on need for dialysis today versus tomorrow (if he needs dialysis today, may not be able to tolerate ultrafiltration). 3. Anemia: Downtrending hemoglobin and hematocrit without overt loss, will give ESA with dialysis. 4. CKD-MBD: Phosphorus level improved with supplementation end of CRRT. 5. Nutrition: Plans noted for swallow evaluation today by speech therapy; off TF's. 6.  Atrial fibrillation: With controlled ventricular response, remains on pressors.  Subjective:   Without acute clinical events overnight.  He reports to be breathing easily and with minimal chest discomfort.  Did not require NIPPV overnight.   Objective:   BP 113/68   Pulse 93   Temp 97.8 F (36.6 C) (Axillary)   Resp 18   Ht 5' 6.5" (1.689 m)   Wt 83.7 kg   SpO2 96%   BMI 29.34 kg/m   Physical Exam: Gen: Appears comfortable resting in bed, remains on oxygen via nasal cannula.  Awake and alert CVS: Pulse regular rhythm, normal rate, S1 and S2 normal.  Intact sternotomy scar/staples Resp: Anteriorly clear to auscultation, no rales/rhonchi.  Left IJ TDC Abd: Soft, obese, nontender Ext: No lower extremity edema.  Labs: BMET Recent Labs  Lab 11/21/19 0410 11/21/19 1833 11/22/19 0317 11/22/19 1600 11/23/19 0441 11/23/19 1624 11/24/19 0524  NA 133* 130* 133* 134* 135 133* 134*  K 4.6 4.6 4.3 4.2 4.4 4.2 4.1  CL 100 98 100 100 99 100 102  CO2 _0 GLUCOSE 239* 217* 175* 149* 176* 191* 193*  BUN 31* 32* 33* 30* 31* 50* 73*  CREATININE 1.96* 2.01* 1.89* 1.73* 1.69* 2.61* 3.92*  CALCIUM 7.2* 7.1* 7.4* 7.4* 7.7* 7.1* 6.9*  PHOS 1.9* 1.9* 2.0* 1.9* 2.5 2.9 3.1   CBC Recent Labs  Lab 11/22/19 0317 11/23/19 0441 11/24/19 0524 11/25/19 0429  WBC 23.0* 28.1* 21.5* 18.0*  NEUTROABS  --  23.8*  --   --   HGB 8.7* 8.8* 8.2* 7.9*  HCT 29.2* 29.9* 27.7* 26.3*  MCV 97.0 96.8 97.2 97.4  PLT 267 267 273 254     Medications:    . amiodarone  200 mg Per Tube BID  . apixaban  5 mg Oral BID  . chlorhexidine gluconate (MEDLINE KIT)  15 mL Mouth Rinse BID  . Chlorhexidine Gluconate Cloth  6 each Topical Daily  . clopidogrel  75 mg Per Tube Daily  . darbepoetin (ARANESP) injection - NON-DIALYSIS  60 mcg Subcutaneous Q Sat-1800  . feeding supplement (PRO-STAT SUGAR FREE 64)  60 mL Per Tube BID  . insulin aspart  0-9 Units Subcutaneous Q4H  . insulin glargine  6 Units Subcutaneous Daily  . mouth rinse  15 mL Mouth Rinse 10 times per day  . midodrine  20 mg Per Tube TID WC  . multivitamin  1 tablet Per Tube QHS  . pantoprazole sodium  40 mg Per Tube Daily  . rosuvastatin  10 mg Per Tube q1800  .  sodium chloride flush  10-40 mL Intracatheter Q12H  . vancomycin variable dose per unstable renal function (pharmacist dosing)   Does not apply See admin instructions   Elmarie Shiley, MD 11/25/2019, 7:45 AM

## 2019-11-25 NOTE — Progress Notes (Signed)
Chaplain engaged in follow-up visit with Patrick [name] and Patrick [name].  [name] expressed gratitude for chaplain being with her after [name] Code in [place] about a week or more ago.  She shared her husband's progress and plan of care with chaplain stating that he would be moving to another facility for his therapy needs.  Chaplain affirmed Bradd' progress.  Chaplain also celebrated the love and care [name] has showered over her husband.  During that time, [name] shared some of her love story with chaplain and defines her husband as a "good man."  Chaplain offered empathic listening and ministry of presence as she shared about her love for Willow, his personhood, her role as a caregiver, and their story together.    Chaplain will continue to follow-up.

## 2019-11-25 NOTE — Progress Notes (Signed)
NAME:  Patrick [name], MRN:  [id number], DOB:  08-27-1955, LOS: 77 ADMISSION DATE:  10/24/2019, CONSULTATION DATE:  11/17/2020 REFERRING MD:  Kirk Ruths, CHIEF COMPLAINT:  Respiratory Arrest 4/23   Brief History   64 y/o male w/ h/o CAD, HTN, HLD, DM, ESRD.CABG x 1 1997, failed kidney transplant,end-stage renal disease on dialysis admitted 3/29  w/ CP, NSTEMI . He underwent  re-do CABG x 3  on 4/1. Post operative course c/b by cardiogenic shock, respiratory failure, VT,illius, and protein malnutrition. Pt. Had respiratory /  PEA arrest in CT on 4/23 requiring intubation. PCCM have been asked to consult  for respiratory failure/ ventilation management post arrest.  Past Medical History  CAD, HTN, HLD, DM, ESRD.  NSTEMI 10/25/2019 10/25/2019: LHC w/ severe CAD. Echo w/ reduced EF 35-40% and moderate AI. CABG Re-Do x 3 on 10/27/2019(SVG-LAD, SVG-PDA, SVG-OM).    RCA PCI 01/2019 Atrial Flutter s/p ablation 2018 CABG 1997 w / LIMA-LAD Combined systolic/ Diastolic Heart Failure Failed kidney transplant,end-stage renal disease on dialysis   Significant Hospital Events   3/29 Admission 4/1 CABG x 3 ( Re-do) 4/23 Respiratory / PEA  Arrest  4/23 Intubation and PCCM consult 4/27 extubated   Consults:  3/29 Renal Consult 3/30 TCTS Consult 4/23 PCCM   Procedures:  4/1 CABG x 3 (SVG-LAD, SVG-PDA, SVG-OM).  Significant Diagnostic Tests:  11/19/2019 [facility]  IMPRESSION: 1. Moderate to large bilateral pleural effusions are identified. The left pleural effusion appears partially loculated with loculated fluid along the paramediastinal left upper lobe. 2. There is compressive type atelectasis and consolidation associated with bilateral pleural effusions with considerable reduction in aerated lung volumes bilaterally. 3. Aortic atherosclerosis. IMPRESSION: Difficult images. There does not appear to be significant pericardial pathology.   11/18/2019 CXR Intubated, endotracheal tube  tip in good position. Otherwise stable lines and tubes. Improved lung volumes since 0907 hours today.  Small bilateral pleural effusions with compressive lung base atelectasis better demonstrated on the CT images today 1045 hours.  Echo 4/14 with EF 30-35%, stable aortic valve replacement.    Echo 4/21 with EF 45-50% and significant respirophasic septal variation.  Pericardium in some places appears thickened though windows are poor.  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  Cath 10/25/2019 . Severe 3 vessel CAD    - CTO of the mid LAD after the first diagonal    - 90% ostial LCx    - 100% mid RCA in stent. Left to right collaterals. 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 Atretic LIMA graft Mildly elevated LVEDP Early restenosis/occlusion of the RCA. This vessel is diffusely diseased and heavily calcified. The ostial LCx appears new compared to prior.  Percutaneous options are very limited Micro Data:  4/30 surgical wound>> 4/28 Blood >>  4/23 respiratory>> normal flora 4/22 blood>> NG 4/14 Blood>> No growth 4/11 Blood >> No Growth 4/8  Respiratory>> Normal Flora 4/8/ Blood >> No Growth 3/29 SARS Coronavirus 2 >> Negative  Antimicrobials:  Vanc 4/11-4/17 Meropenem 4/11-4/17 Ceftriaxone 4/29>> Vancomycin 4/29>>  Interim history/subjective:  Swallow study this morning.  He stood up at bedside with PT.  His main complaint today is being tired.  Per his wife he has been more awake yesterday afternoon and today.  Objective   Blood pressure 125/67, pulse 92, temperature 97.8 F (36.6 C), temperature source Axillary, resp. rate (!) 21, height 5' 6.5" (1.689 m), weight 83.7 kg, SpO2 100 %. CVP:  [0 mmHg-14 mmHg] 1 mmHg  Intake/Output Summary (Last 24 hours) at 11/25/2019 1144 Last data filed at 11/25/2019 1100 Gross per 24 hour  Intake 1177.4 ml  Output --  Net 1177.4 ml   Filed Weights   11/23/19 0348  11/24/19 0600 11/25/19 0643  Weight: 83.9 kg 82.6 kg 83.7 kg    Examination: General: Chronically ill-appearing man lying in bed, lethargic HENT: [place]/AT, eyes anicteric lungs: Breathing comfortably on 2 L nasal cannula.  Clear to auscultation bilaterally. Cardiovascular: Regular rate and rhythm  abdomen: Soft, mildly tender to palpation Extremities: Less swelling but still tender area of left shin near vein graft.  Ischemic-looking first toe.  No cyanosis or clubbing. Neuro: Awake, answering questions appropriately, moving all extremities Skin: Warm, dry.  Sternal wound dressed.  Resolved Hospital Problem list   Diarrhea   Assessment & Plan:  Acute hypoxic respiratory failure / PEA cardiac arrest likely due to respiratory decompensation while in CT 4/23 - appears to be multifactorial from pulmonary edema, bilateral pleural effusions (possibly loculated), suspected aspiration, undiagnosed OSA/OHS, and poor respiratory mechanics.  Left pleural effusion minimal on bedside ultrasound 4/26 & 4/28.  Extubated 4/27. -Continue supplemental oxygen via nasal cannula, continue to titrate down FiO2 as able -Maintain euvolemia  Cardiogenic shock due to ischemic cardiomyopathy, chronic HFrEF, history of paroxysmal Afib -Continue amiodarone and Eliquis for A. fib -Continue Plavix and statin.  Intolerant to beta-blockers while on inotropes. -Continue telemetry monitoring -Continue vasopressors as required to maintain MAP greater than 65 and appropriate cardiac output  Elevated LFTs-improving.  No evidence of gallbladder pathology. -Continue rosuvastatin and amiodarone -Continue to monitor periodically  Post-arrest leukocytosis- improved today.  Remains normothermic.  All culture data remains negative.  -Recommend continuing antibiotics to complete 7-day course -Continue to follow cultures  Abdominal pain due to ileus -Avoid opiates -Out of bed mobility as able -diet per SLP recs   ESRD with  post-arrest hyperkalemia  -HD per nephrology -Renally dose meds -Strict I/os  Hyperglycemia, improved  -Continue Accu-Cheks every 4 hours with sliding scale insulin as needed -Continue long-acting insulin -Goal BG 140-180 while admitted to the ICU  Acute on chronic anemia of renal disease - stable  -Continue to monitor periodically -Transfuse for hemoglobin less than 8 or hemodynamically significant bleeding. -Continue Aranesp per nephrology   We will sign off.  Please call with questions.   Best practice:  Diet: TF  Pain/Anxiety/Delirium protocol (if indicated): N/A VAP protocol (if indicated): N/A DVT prophylaxis: heparin GI prophylaxis: Protonix Glucose control: CBG with SSI Mobility: BR Code Status: Full Family Communication: Wife updated at bedside Disposition: ICU  Labs   CBC: Recent Labs  Lab 11/21/19 0410 11/22/19 0317 11/23/19 0441 11/24/19 0524 11/25/19 0429  WBC 17.1* 23.0* 28.1* 21.5* 18.0*  NEUTROABS  --   --  23.8*  --   --   HGB 8.4* 8.7* 8.8* 8.2* 7.9*  HCT 27.9* 29.2* 29.9* 27.7* 26.3*  MCV 96.9 97.0 96.8 97.2 97.4  PLT 233 267 267 273 742    Basic Metabolic Panel: Recent Labs  Lab 11/20/19 0353 11/20/19 0626 11/21/19 0410 11/21/19 1833 11/22/19 0317 11/22/19 0317 11/22/19 1600 11/23/19 0441 11/23/19 1624 11/24/19 0524 11/25/19 0750  NA 134*   < > 133*   < > 133*   < > 134* 135 133* 134* 135  K 4.8   < > 4.6   < > 4.3   < > 4.2 4.4 4.2 4.1 4.6  CL 102   < > 100   < > 100   < >  100 99 100 102 101  CO2 26   < > 27   < > 24   < > 27 25 24 22  18*  GLUCOSE 251*   < > 239*   < > 175*   < > 149* 176* 191* 193* 158*  BUN 36*   < > 31*   < > 33*   < > 30* 31* 50* 73* 112*  CREATININE 2.31*   < > 1.96*   < > 1.89*   < > 1.73* 1.69* 2.61* 3.92* 6.56*  CALCIUM 7.2*   < > 7.2*   < > 7.4*   < > 7.4* 7.7* 7.1* 6.9* 6.6*  MG 2.4  --  2.3  --  2.4  --   --  2.4  --  2.5*  --   PHOS 2.4*   < > 1.9*   < > 2.0*  --  1.9* 2.5 2.9 3.1  --    < > =  values in this interval not displayed.   GFR: Estimated Creatinine Clearance: 11.5 mL/min (A) (by C-G formula based on SCr of 6.56 mg/dL (H)). Recent Labs  Lab 11/18/19 1323 11/18/19 1611 11/19/19 0342 11/19/19 1544 11/22/19 0317 11/23/19 0441 11/24/19 0524 11/25/19 0429  WBC  --   --   --    < > 23.0* 28.1* 21.5* 18.0*  LATICACIDVEN 2.0* 1.7 1.1  --   --   --   --   --    < > = values in this interval not displayed.    Liver Function Tests: Recent Labs  Lab 11/19/19 0342 11/19/19 0352 11/20/19 0353 11/20/19 1522 11/21/19 0410 11/21/19 1833 11/22/19 0317 11/22/19 0317 11/22/19 1600 11/23/19 0441 11/23/19 1157 11/23/19 1624 11/24/19 0524  AST 86*  --  251*  --  131*  --  73*  --   --   --  44*  --   --   ALT 101*  --  189*  --  156*  --  121*  --   --   --  81*  --   --   ALKPHOS 198*  --  264*  --  324*  --  319*  --   --   --  253*  --   --   BILITOT 1.6*  --  1.4*  --  1.9*  --  1.6*  --   --   --  1.8*  --   --   PROT 8.7*  --  8.5*  --  8.3*  --  9.1*  --   --   --  8.1  --   --   ALBUMIN 1.7*   < > 1.7*  1.6*   < > 1.7*  1.7*   < > 1.8*  1.8*   < > 1.8* 1.8* 1.7* 1.7* 1.5*   < > = values in this interval not displayed.   Recent Labs  Lab 11/23/19 1157  LIPASE 28   No results for input(s): AMMONIA in the last 168 hours.  ABG    Component Value Date/Time   PHART 7.375 11/20/2019 0626   PCO2ART 39.8 11/20/2019 0626   PO2ART 103 11/20/2019 0626   HCO3 23.4 11/20/2019 0626   TCO2 25 11/20/2019 0626   ACIDBASEDEF 2.0 11/20/2019 0626   O2SAT 69.5 11/25/2019 0430     Coagulation Profile: No results for input(s): INR, PROTIME in the last 168 hours.  Cardiac Enzymes: Recent Labs  Lab 11/20/19 1522  CKTOTAL  795*    HbA1C: Hemoglobin A1C  Date/Time Value Ref Range Status  06/07/2019 10:54 AM 6.4 (A) 4.0 - 5.6 % Final   Hgb A1c MFr Bld  Date/Time Value Ref Range Status  10/27/2019 04:51 AM 6.2 (H) 4.8 - 5.6 % Final    Comment:    (NOTE) Pre  diabetes:          5.7%-6.4% Diabetes:              >6.4% Glycemic control for   <7.0% adults with diabetes   08/20/2018 07:23 AM 6.9 (H) 4.8 - 5.6 % Final    Comment:    (NOTE) Pre diabetes:          5.7%-6.4% Diabetes:              >6.4% Glycemic control for   <7.0% adults with diabetes     CBG: Recent Labs  Lab 11/24/19 1607 11/24/19 1927 11/24/19 2331 11/25/19 0336 11/25/19 Keyes Joud Ingwersen, DO 11/25/19 12:56 PM [facility]

## 2019-11-25 NOTE — Progress Notes (Signed)
Modified Barium Swallow Progress Note  Patient Details  Name: Patrick [name] MRN: [id number] Date of Birth: Oct 04, 1955  Today's Date: 11/25/2019  Modified Barium Swallow completed.  Full report located under Chart Review in the Imaging Section.  Brief recommendations include the following:  Clinical Impression  Pt exhibits mild oral dysphagia marked by decreased lingual control and increased effort to formulate bolus. Mild hesitation prior to transitioning to pharynx and minimal lingual residue. He masticated solids thoroughly with increased time and effort given deconditioning and decreased respiratory support. Pharygneal phase of swallow was normal in regards to timing, coordination of respiration and swallow, protection and strength without laryngeal penetration or aspiration present. Respiratory status, increased work of breathing and general deconditioning are pt's risk factors that could compromise safety. Recommend Dys 3 texture, thin liquids, straws allowed, pills whole in puree, upright position, rest breaks and continued ST.       Swallow Evaluation Recommendations       SLP Diet Recommendations: Thin liquid;Dysphagia 3 (Mech soft) solids   Liquid Administration via: Cup;Straw   Medication Administration: Whole meds with puree   Supervision: Patient able to self feed;Staff to assist with self feeding   Compensations: Slow rate;Clear throat intermittently   Postural Changes: Seated upright at 90 degrees;Remain semi-upright after after feeds/meals (Comment)   Oral Care Recommendations: Oral care BID        Houston Siren 11/25/2019,1:56 PM  Orbie Pyo [place].Ed Risk analyst [phone] Office [phone]

## 2019-11-25 NOTE — Progress Notes (Signed)
Nutrition Follow-up  DOCUMENTATION CODES:   Not applicable  INTERVENTION:   Continue Cortrak tube, even once diet advanced, until pt demonstrating diet tolerance and adequate po. Prior to intubation, pt with Cortrak due to poor po intake on oral diet  Tube Feeding via post-pyloric Cortrak:  Recommend trial of trickle TF, Vital 1.5 at 20 ml/hr If tolerates trickle TF, recommend advancing as tolerated to goal rate of 55 ml/hr with addition of Pro-Stat 30 mL TID Goal rate provides 2280 kcals, 134 g of protein and 1003 mL of free water   Continue Rena-Vit   NUTRITION DIAGNOSIS:   Inadequate oral intake related to acute illness as evidenced by NPO status.  Being addressed via TF   GOAL:   Patient will meet greater than or equal to 90% of their needs  Not met  MONITOR:   PO intake, Diet advancement, Supplement acceptance, Labs, Weight trends, Skin  REASON FOR ASSESSMENT:   Ventilator    ASSESSMENT:   64 yo male admitted with NSTEMI and found to have mutlivessel CAD and underwent CABG on 4/1, ESRD/HD requiring CRRT. PMH includes ESRD on HD s/p renal transplant, CAD s/p CABG, CHF, DM   3/29 Admission 3/30 Cardiac Cath 4/01 CABG with IABP placement(removed 4/3), Intubated 4/02 CRRT initiated 4/04 Extubated 4/05 Cortrak placed,tip in stomach;okay to feed gastric per RD discussion with Dr. Orvan Seen 4/07 CRRT discontinued, ileus, NPO, Cortrak pulled out by pt 4/08 CL diet in AM, FL diet in PM 4/09 iHD with 1.3 L removed 4/10 CRRT re-initiated 4/11 Diet advanced to Dysphagia III 4/13 Diet advanced to Heart Healthy, Carb Modified 4/14 Cortrak placed 4/15 TF off due to abdominal pain 4/23 PEA arrest while supine for CT chest, hyperkalemia with K+>7.5, Intubated 4/27 Extubated 4/28 CRRT discontinued, TF held for ileus  Pt has been working with PT, able to stand today ad ambulate some!  NPO, noted SLP evaluating for diet advancement  TF on hold due to concern for  ileus on 4/28, remains on hold. Abd xray from 4/28 with Cortrak tip in 2nd porition of duodenum, mildly dilated small bowel with possible early ileus or enteritis.  No repeat xray since.   Pt with +stool via rectal tube today. Abdomen obese/distended but not taut. BS present  Labs: reviewed Meds: ss novolog, lantus, rena-vit, midorine   Diet Order:   Diet Order            Diet NPO time specified  Diet effective now              EDUCATION NEEDS:   No education needs have been identified at this time  Skin:  Skin Assessment: Skin Integrity Issues: Skin Integrity Issues:: Stage II, Unstageable DTI: R toe Stage II: sacrum Unstageable: R toe, L toe  Last BM:  4/30 rectal tube  Height:   Ht Readings from Last 1 Encounters:  11/22/19 5' 6.5" (1.689 m)    Weight:   Wt Readings from Last 1 Encounters:  11/25/19 83.7 kg   BMI:  Body mass index is 29.34 kg/m.  Estimated Nutritional Needs:   Kcal:  2300-2700  Protein:  120-140 gm  Fluid:  1000 plus UOP   Kerman Passey MS, RDN, LDN, CNSC RD Pager Number and RD On-Call Pager Number Located in [place]

## 2019-11-26 LAB — COOXEMETRY PANEL
Carboxyhemoglobin: 2.2 % — ABNORMAL HIGH (ref 0.5–1.5)
Methemoglobin: 1.6 % — ABNORMAL HIGH (ref 0.0–1.5)
O2 Saturation: 66.4 %
Total hemoglobin: 4.6 g/dL — CL (ref 12.0–16.0)

## 2019-11-26 LAB — BASIC METABOLIC PANEL
Anion gap: 17 — ABNORMAL HIGH (ref 5–15)
BUN: 135 mg/dL — ABNORMAL HIGH (ref 8–23)
CO2: 16 mmol/L — ABNORMAL LOW (ref 22–32)
Calcium: 6.4 mg/dL — CL (ref 8.9–10.3)
Chloride: 103 mmol/L (ref 98–111)
Creatinine, Ser: 8.06 mg/dL — ABNORMAL HIGH (ref 0.61–1.24)
GFR calc Af Amer: 7 mL/min — ABNORMAL LOW (ref >60)
GFR calc non Af Amer: 6 mL/min — ABNORMAL LOW (ref >60)
Glucose, Bld: 152 mg/dL — ABNORMAL HIGH (ref 70–99)
Potassium: 4.5 mmol/L (ref 3.5–5.1)
Sodium: 136 mmol/L (ref 135–145)

## 2019-11-26 LAB — CBC
HCT: 24.1 % — ABNORMAL LOW (ref 39.0–52.0)
Hemoglobin: 7.2 g/dL — ABNORMAL LOW (ref 13.0–17.0)
MCH: 28.3 pg (ref 26.0–34.0)
MCHC: 29.9 g/dL — ABNORMAL LOW (ref 30.0–36.0)
MCV: 94.9 fL (ref 80.0–100.0)
Platelets: 206 10*3/uL (ref 150–400)
RBC: 2.54 MIL/uL — ABNORMAL LOW (ref 4.22–5.81)
RDW: 20.6 % — ABNORMAL HIGH (ref 11.5–15.5)
WBC: 14.3 10*3/uL — ABNORMAL HIGH (ref 4.0–10.5)
nRBC: 0.1 % (ref 0.0–0.2)

## 2019-11-26 LAB — GLUCOSE, CAPILLARY
Glucose-Capillary: 104 mg/dL — ABNORMAL HIGH (ref 70–99)
Glucose-Capillary: 118 mg/dL — ABNORMAL HIGH (ref 70–99)
Glucose-Capillary: 129 mg/dL — ABNORMAL HIGH (ref 70–99)
Glucose-Capillary: 136 mg/dL — ABNORMAL HIGH (ref 70–99)
Glucose-Capillary: 141 mg/dL — ABNORMAL HIGH (ref 70–99)
Glucose-Capillary: 142 mg/dL — ABNORMAL HIGH (ref 70–99)
Glucose-Capillary: 151 mg/dL — ABNORMAL HIGH (ref 70–99)

## 2019-11-26 MED ORDER — PANTOPRAZOLE SODIUM 40 MG PO TBEC
40.0000 mg | DELAYED_RELEASE_TABLET | Freq: Every day | ORAL | Status: DC
  Administered 2019-11-27 – 2019-12-06 (×10): 40 mg via ORAL
  Filled 2019-11-26 (×12): qty 1

## 2019-11-26 MED ORDER — RENA-VITE PO TABS
1.0000 | ORAL_TABLET | Freq: Every day | ORAL | Status: DC
  Administered 2019-11-26 – 2019-12-12 (×17): 1 via ORAL
  Filled 2019-11-26 (×17): qty 1

## 2019-11-26 MED ORDER — DARBEPOETIN ALFA 100 MCG/0.5ML IJ SOSY
PREFILLED_SYRINGE | INTRAMUSCULAR | Status: AC
  Administered 2019-11-26: 15:00:00 100 ug via INTRAVENOUS
  Filled 2019-11-26: qty 0.5

## 2019-11-26 MED ORDER — HEPARIN SODIUM (PORCINE) 1000 UNIT/ML IJ SOLN
INTRAMUSCULAR | Status: AC
  Administered 2019-11-26: 15:00:00 1000 [IU] via INTRAVENOUS_CENTRAL
  Filled 2019-11-26: qty 4

## 2019-11-26 MED ORDER — ROSUVASTATIN CALCIUM 5 MG PO TABS
10.0000 mg | ORAL_TABLET | Freq: Every day | ORAL | Status: DC
  Administered 2019-11-26 – 2019-12-06 (×11): 10 mg via ORAL
  Filled 2019-11-26 (×11): qty 2

## 2019-11-26 MED ORDER — VANCOMYCIN HCL IN DEXTROSE 1-5 GM/200ML-% IV SOLN
1000.0000 mg | Freq: Once | INTRAVENOUS | Status: AC
  Administered 2019-11-26: 16:00:00 1000 mg via INTRAVENOUS
  Filled 2019-11-26: qty 200

## 2019-11-26 MED ORDER — AMIODARONE HCL 200 MG PO TABS
200.0000 mg | ORAL_TABLET | Freq: Two times a day (BID) | ORAL | Status: DC
  Administered 2019-11-26 – 2019-12-06 (×22): 200 mg via ORAL
  Filled 2019-11-26 (×23): qty 1

## 2019-11-26 MED ORDER — CLOPIDOGREL BISULFATE 75 MG PO TABS
75.0000 mg | ORAL_TABLET | Freq: Every day | ORAL | Status: DC
  Administered 2019-11-26 – 2019-12-06 (×10): 75 mg via ORAL
  Filled 2019-11-26 (×12): qty 1

## 2019-11-26 MED ORDER — DARBEPOETIN ALFA 100 MCG/0.5ML IJ SOSY: 100.0000 ug | PREFILLED_SYRINGE | INTRAMUSCULAR | Status: DC

## 2019-11-26 MED ORDER — MIDODRINE HCL 5 MG PO TABS
20.0000 mg | ORAL_TABLET | Freq: Three times a day (TID) | ORAL | Status: DC
  Administered 2019-11-26 – 2019-12-07 (×34): 20 mg via ORAL
  Filled 2019-11-26 (×34): qty 4

## 2019-11-26 NOTE — Progress Notes (Signed)
Patient ID: Patrick [name], male   DOB: Mar 31, 1956, 64 y.o.   MRN: [id number] [facility] Progress Note   Assessment/ Plan:   1.  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  He has been weaned off of Levophed and remains on fixed dose vasopressin along with oral midodrine; MAP when seen was 78 and he appears to be otherwise doing well. 2. ESRD: Previously on CRRT due to hemodynamic instability-this was stopped after successful ultrafiltration.  I have ordered for hemodialysis today here in the ICU, will attempt linear sodium modeling with UF profile and low-temperature dialysate strategies to limit intradialytic hypotension. 3. Anemia: Downtrending hemoglobin and hematocrit without overt loss, will give ESA with dialysis today. 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 5. Nutrition: On dysphagia 3 diet with aspiration precautions after swallow evaluation results from yesterday 6.  Atrial fibrillation: With controlled ventricular response, remains on pressors.  Subjective:   Denies any complaints at this time; specifically denies any chest pain or shortness of breath   Objective:   BP 105/66   Pulse 92   Temp 98.1 F (36.7 C) (Axillary)   Resp (!) 23   Ht 5' 6.5" (1.689 m)   Wt 86.7 kg   SpO2 99%   BMI 30.39 kg/m   Physical Exam: Gen: Comfortably resting in bed, on oxygen via [place] CVS: Pulse regular rhythm, normal rate, S1 and S2 normal.  Intact sternotomy scar/staples Resp: Anteriorly clear to auscultation, no rales/rhonchi.  Left IJ TDC Abd: Soft, obese, nontender Ext: No lower extremity edema.  Labs: BMET Recent Labs  Lab 11/21/19 0410 11/21/19 0410 11/21/19 1833 11/21/19 1833 11/22/19 0317 11/22/19 1600 11/23/19 0441 11/23/19 1624 11/24/19 0524 11/25/19 0750 11/26/19 0500  NA 133*   < > 130*   < >  133* 134* 135 133* 134* 135 136  K 4.6   < > 4.6   < > 4.3 4.2 4.4 4.2 4.1 4.6 4.5  CL 100   < > 98   < > 100 100 99 100 102 101 103  CO2 27   < > 24   < > _0 18* 16*  GLUCOSE 239*   < > 217*   < > 175* 149* 176* 191* 193* 158* 152*  BUN 31*   < > 32*   < > 33* 30* 31* 50* 73* 112* 135*  CREATININE 1.96*   < > 2.01*   < > 1.89* 1.73* 1.69* 2.61* 3.92* 6.56* 8.06*  CALCIUM 7.2*   < > 7.1*   < > 7.4* 7.4* 7.7* 7.1* 6.9* 6.6* 6.4*  PHOS 1.9*  --  1.9*  --  2.0* 1.9* 2.5 2.9 3.1  --   --    < > = values in this interval not displayed.   CBC Recent Labs  Lab 11/23/19 0441 11/24/19 0524 11/25/19 0429 11/26/19 0500  WBC 28.1* 21.5* 18.0* 14.3*  NEUTROABS 23.8*  --   --   --   HGB 8.8* 8.2* 7.9* 7.2*  HCT 29.9* 27.7* 26.3* 24.1*  MCV 96.8 97.2 97.4 94.9  PLT 267 273 254 206     Medications:    . amiodarone  200 mg Oral BID  . apixaban  5 mg Oral BID  . chlorhexidine gluconate (MEDLINE KIT)  15 mL Mouth Rinse BID  . Chlorhexidine Gluconate Cloth  6 each Topical Daily  .  clopidogrel  75 mg Oral Daily  . darbepoetin (ARANESP) injection - NON-DIALYSIS  60 mcg Subcutaneous Q Sat-1800  . feeding supplement (PRO-STAT SUGAR FREE 64)  60 mL Per Tube BID  . insulin aspart  0-9 Units Subcutaneous Q4H  . insulin glargine  6 Units Subcutaneous Daily  . mouth rinse  15 mL Mouth Rinse TID AC & HS  . midodrine  20 mg Oral TID WC  . multivitamin  1 tablet Oral QHS  . pantoprazole  40 mg Oral Daily  . rosuvastatin  10 mg Oral q1800  . sodium chloride flush  10-40 mL Intracatheter Q12H  . vancomycin variable dose per unstable renal function (pharmacist dosing)   Does not apply See admin instructions   Elmarie Shiley, MD 11/26/2019, 7:36 AM

## 2019-11-26 NOTE — Progress Notes (Signed)
      [address]       [place] [address]             [phone]                 30 Days Post-Op Procedure(s) (LRB): REDO CORONARY ARTERY BYPASS GRAFTING TIMES 3  USING LEFT GREATER SAPHENOUS LEG VEIN HARVESTED ENDOSCOPICALLY(CABG) (N/A) TRANSESOPHAGEAL ECHOCARDIOGRAM (TEE) (N/A) AORTIC VALVE REPLACEMENT (AVR) USING EDWARDS INTUITY 23 MM AORTIC VALVE. (N/A) Endovein Harvest Of Greater Saphenous Vein (Left)   Events: No events.  Continues to have drainage from CT incision _______________________________________________________________ Vitals: BP 107/64   Pulse 92   Temp 98.1 F (36.7 C) (Oral)   Resp (!) 26   Ht 5' 6.5" (1.689 m)   Wt 86.7 kg   SpO2 99%   BMI 30.39 kg/m   - Neuro: alert NAD  - Cardiovascular: sinus  Drips: vaso.   CVP:  [0 mmHg-5 mmHg] 4 mmHg  - Pulm: EWOB    ABG    Component Value Date/Time   PHART 7.375 11/20/2019 0626   PCO2ART 39.8 11/20/2019 0626   PO2ART 103 11/20/2019 0626   HCO3 23.4 11/20/2019 0626   TCO2 25 11/20/2019 0626   ACIDBASEDEF 2.0 11/20/2019 0626   O2SAT 66.4 11/26/2019 0508    - Abd: soft   .Intake/Output      04/30 0701 - 05/01 0700 05/01 0701 - 05/02 0700   P.O. 100    I.V. (mL/kg) 379 (4.4) 11.4 (0.1)   Other     NG/GT     IV Piggyback 100    Total Intake(mL/kg) 579 (6.7) 11.4 (0.1)   Stool 120    Total Output 120    Net +459 +11.4        Stool Occurrence 1 x       _______________________________________________________________ Labs: CBC Latest Ref Rng & Units 11/26/2019 11/25/2019 11/24/2019  WBC 4.0 - 10.5 K/uL 14.3(H) 18.0(H) 21.5(H)  Hemoglobin 13.0 - 17.0 g/dL 7.2(L) 7.9(L) 8.2(L)  Hematocrit 39.0 - 52.0 % 24.1(L) 26.3(L) 27.7(L)  Platelets 150 - 400 K/uL 206 254 273   CMP Latest Ref Rng & Units 11/26/2019 11/25/2019 11/24/2019  Glucose 70 - 99 mg/dL 152(H) 158(H) 193(H)  BUN 8 - 23 mg/dL 135(H) 112(H) 73(H)  Creatinine 0.61 - 1.24 mg/dL 8.06(H) 6.56(H) 3.92(H)  Sodium 135 - 145  mmol/L 136 135 134(L)  Potassium 3.5 - 5.1 mmol/L 4.5 4.6 4.1  Chloride 98 - 111 mmol/L 103 101 102  CO2 22 - 32 mmol/L 16(L) 18(L) 22  Calcium 8.9 - 10.3 mg/dL 6.4(LL) 6.6(L) 6.9(L)  Total Protein 6.5 - 8.1 g/dL - - -  Total Bilirubin 0.3 - 1.2 mg/dL - - -  Alkaline Phos 38 - 126 U/L - - -  AST 15 - 41 U/L - - -  ALT 0 - 44 U/L - - -      _______________________________________________________________  Assessment and Plan: POD 30 s/p redo CABG  Neuro: alert NAD CV: pacer cut to back up rate.  Labile blood pressures, on vaos Pulm: continue pulm toilet Renal: Nephrology planning HD today.  Likely will not tolerate given low BP GI: on tube feeds Heme: hgb 7.2 ID: [id number].  Will need debridement of sternal incision, possible wound vac Dispo: continue ICU care  [name], MD 11/26/2019 9:36 AM

## 2019-11-27 LAB — GLUCOSE, CAPILLARY
Glucose-Capillary: 163 mg/dL — ABNORMAL HIGH (ref 70–99)
Glucose-Capillary: 145 mg/dL — ABNORMAL HIGH (ref 70–99)
Glucose-Capillary: 143 mg/dL — ABNORMAL HIGH (ref 70–99)
Glucose-Capillary: 108 mg/dL — ABNORMAL HIGH (ref 70–99)
Glucose-Capillary: 150 mg/dL — ABNORMAL HIGH (ref 70–99)
Glucose-Capillary: 141 mg/dL — ABNORMAL HIGH (ref 70–99)

## 2019-11-27 LAB — BASIC METABOLIC PANEL
Anion gap: 12 (ref 5–15)
BUN: 56 mg/dL — ABNORMAL HIGH (ref 8–23)
CO2: 22 mmol/L (ref 22–32)
Calcium: 6.5 mg/dL — ABNORMAL LOW (ref 8.9–10.3)
Chloride: 101 mmol/L (ref 98–111)
Creatinine, Ser: 5.06 mg/dL — ABNORMAL HIGH (ref 0.61–1.24)
GFR calc Af Amer: 13 mL/min — ABNORMAL LOW (ref >60)
GFR calc non Af Amer: 11 mL/min — ABNORMAL LOW (ref >60)
Glucose, Bld: 171 mg/dL — ABNORMAL HIGH (ref 70–99)
Potassium: 3.6 mmol/L (ref 3.5–5.1)
Sodium: 135 mmol/L (ref 135–145)

## 2019-11-27 LAB — CBC
HCT: 24.2 % — ABNORMAL LOW (ref 39.0–52.0)
Hemoglobin: 7.3 g/dL — ABNORMAL LOW (ref 13.0–17.0)
MCH: 28.3 pg (ref 26.0–34.0)
MCHC: 30.2 g/dL (ref 30.0–36.0)
MCV: 93.8 fL (ref 80.0–100.0)
Platelets: 220 10*3/uL (ref 150–400)
RBC: 2.58 MIL/uL — ABNORMAL LOW (ref 4.22–5.81)
RDW: 21.1 % — ABNORMAL HIGH (ref 11.5–15.5)
WBC: 11.4 10*3/uL — ABNORMAL HIGH (ref 4.0–10.5)
nRBC: 0.3 % — ABNORMAL HIGH (ref 0.0–0.2)

## 2019-11-27 LAB — COOXEMETRY PANEL
Carboxyhemoglobin: 2 % — ABNORMAL HIGH (ref 0.5–1.5)
Methemoglobin: 1 % (ref 0.0–1.5)
O2 Saturation: 59.3 %
Total hemoglobin: 7.5 g/dL — ABNORMAL LOW (ref 12.0–16.0)

## 2019-11-27 MED ORDER — ALBUMIN HUMAN 5 % IV SOLN
12.5000 g | Freq: Once | INTRAVENOUS | Status: AC
  Administered 2019-11-27: 12.5 g via INTRAVENOUS
  Filled 2019-11-27: qty 250

## 2019-11-27 NOTE — Progress Notes (Signed)
      [address]       [place] [address]             [phone]                 31 Days Post-Op Procedure(s) (LRB): REDO CORONARY ARTERY BYPASS GRAFTING TIMES 3  USING LEFT GREATER SAPHENOUS LEG VEIN HARVESTED ENDOSCOPICALLY(CABG) (N/A) TRANSESOPHAGEAL ECHOCARDIOGRAM (TEE) (N/A) AORTIC VALVE REPLACEMENT (AVR) USING EDWARDS INTUITY 23 MM AORTIC VALVE. (N/A) Endovein Harvest Of Greater Saphenous Vein (Left)   Events: Tolerated HD.  1.5L removed, but pt started back on levo.  Also received 24mls of albumin _______________________________________________________________ Vitals: BP (!) 107/58   Pulse 95   Temp 98.1 F (36.7 C) (Oral)   Resp (!) 23   Ht 5' 6.5" (1.689 m)   Wt 85.6 kg   SpO2 100%   BMI 30.00 kg/m   - Neuro: alert NAD  - Cardiovascular: paced at 95  Drips: vaso.  0.03, levo 6 CVP:  [0 mmHg-4 mmHg] 2 mmHg  - Pulm: EWOB    ABG    Component Value Date/Time   PHART 7.375 11/20/2019 0626   PCO2ART 39.8 11/20/2019 0626   PO2ART 103 11/20/2019 0626   HCO3 23.4 11/20/2019 0626   TCO2 25 11/20/2019 0626   ACIDBASEDEF 2.0 11/20/2019 0626   O2SAT 59.3 11/27/2019 0230    - Abd: soft   .Intake/Output      05/01 0701 - 05/02 0700 05/02 0701 - 05/03 0700   P.O. 240 120   I.V. (mL/kg) 407.8 (4.8) 34.4 (0.4)   NG/GT 30    IV Piggyback 317 196.7   Total Intake(mL/kg) 994.8 (11.6) 351.1 (4.1)   Other 1480    Stool     Total Output 1480    Net -485.2 +351.1           _______________________________________________________________ Labs: CBC Latest Ref Rng & Units 11/27/2019 11/26/2019 11/25/2019  WBC 4.0 - 10.5 K/uL 11.4(H) 14.3(H) 18.0(H)  Hemoglobin 13.0 - 17.0 g/dL 7.3(L) 7.2(L) 7.9(L)  Hematocrit 39.0 - 52.0 % 24.2(L) 24.1(L) 26.3(L)  Platelets 150 - 400 K/uL 220 206 254   CMP Latest Ref Rng & Units 11/27/2019 11/26/2019 11/25/2019  Glucose 70 - 99 mg/dL 171(H) 152(H) 158(H)  BUN 8 - 23 mg/dL 56(H) 135(H) 112(H)  Creatinine 0.61 -  1.24 mg/dL 5.06(H) 8.06(H) 6.56(H)  Sodium 135 - 145 mmol/L 135 136 135  Potassium 3.5 - 5.1 mmol/L 3.6 4.5 4.6  Chloride 98 - 111 mmol/L 101 103 101  CO2 22 - 32 mmol/L 22 16(L) 18(L)  Calcium 8.9 - 10.3 mg/dL 6.5(L) 6.4(LL) 6.6(L)  Total Protein 6.5 - 8.1 g/dL - - -  Total Bilirubin 0.3 - 1.2 mg/dL - - -  Alkaline Phos 38 - 126 U/L - - -  AST 15 - 41 U/L - - -  ALT 0 - 44 U/L - - -      _______________________________________________________________  Assessment and Plan: POD 31 s/p redo CABG  Neuro: alert NAD CV: hypotensive after HD.  Will wean levo as tolerated Pulm: continue pulm toilet Renal: on HD, net -5L since admission.  Remains hypotensive.  Would consider UF only at next dialysis run GI: on tube feeds Heme: hgb 7.3 ID: [id number].  Will need debridement of sternal incision, possible wound vac Dispo: continue ICU care  [name], MD 11/27/2019 9:40 AM

## 2019-11-27 NOTE — Progress Notes (Signed)
Bipap is PRN.  No distress noted, patient states he is breathing fine.  Patient is currently on 2L [place] with an O2 sat of 100%.  I explained to patient that if he started having difficulties breathing to have the RN call.  Will continue to monitor.

## 2019-11-27 NOTE — Progress Notes (Signed)
Patient ID: Patrick [name], male   DOB: 01-27-1956, 64 y.o.   MRN: [id number] [facility] Progress Note   Assessment/ Plan:   1.  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  Unfortunately back on Levophed and vasopressin along with ongoing midodrine.  Will give 250 cc albumin 5%.  Plans noted for debridement of sternal incision with possible wound VAC placement. 2. ESRD: Previously on CRRT due to hemodynamic instability-this was stopped after successful ultrafiltration.  He underwent hemodialysis yesterday with ultrafiltration of 1.5 L and thereafter developed hypotension for which he is now back on Levophed.  Will prescribe albumin to try and improve hemodynamic status and monitor daily for acute dialysis needs with plan to continue TTS dialysis unless acutely needed. 3. Anemia: Downtrending hemoglobin and hematocrit without overt loss, he got ESA yesterday. 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 5. Nutrition: On dysphagia 3 diet with aspiration precautions based on results of swallow test. 6.  Atrial fibrillation: With controlled ventricular response, remains on pressors.  Subjective:   Tolerated hemodialysis well yesterday however, back on Levophed due to hypotension following ultrafiltration.  He denies any chest pain or shortness of breath.   Objective:   BP (!) 80/69   Pulse 95   Temp 97.8 F (36.6 C) (Oral)   Resp 16   Ht 5' 6.5" (1.689 m)   Wt 85.6 kg   SpO2 99%   BMI 30.00 kg/m   Physical Exam: Gen: Comfortably resting in bed, on oxygen via [place] CVS: Pulse regular rhythm, normal rate, S1 and S2 normal.  Intact sternotomy scar/staples with some drainage noted on the lower aspect of the wound Resp: Anteriorly clear to auscultation, no rales/rhonchi.  Left IJ TDC Abd: Soft, obese, nontender Ext: No lower  extremity edema.  Labs: BMET Recent Labs  Lab 11/21/19 0410 11/21/19 0410 11/21/19 1833 11/21/19 1833 11/22/19 0317 11/22/19 0317 11/22/19 1600 11/23/19 0441 11/23/19 1624 11/24/19 0524 11/25/19 0750 11/26/19 0500 11/27/19 0230  NA 133*   < > 130*   < > 133*   < > 134* 135 133* 134* 135 136 135  K 4.6   < > 4.6   < > 4.3   < > 4.2 4.4 4.2 4.1 4.6 4.5 3.6  CL 100   < > 98   < > 100   < > 100 99 100 102 101 103 101  CO2 27   < > 24   < > 24   < > 27 25 24 22  18* 16* 22  GLUCOSE 239*   < > 217*   < > 175*   < > 149* 176* 191* 193* 158* 152* 171*  BUN 31*   < > 32*   < > 33*   < > 30* 31* 50* 73* 112* 135* 56*  CREATININE 1.96*   < > 2.01*   < > 1.89*   < > 1.73* 1.69* 2.61* 3.92* 6.56* 8.06* 5.06*  CALCIUM 7.2*   < > 7.1*   < > 7.4*   < > 7.4* 7.7* 7.1* 6.9* 6.6* 6.4* 6.5*  PHOS 1.9*  --  1.9*  --  2.0*  --  1.9* 2.5 2.9 3.1  --   --   --    < > = values in this interval not displayed.   CBC Recent Labs  Lab 11/23/19 0441 11/23/19 0441 11/24/19 0524 11/25/19 0429  11/26/19 0500 11/27/19 0230  WBC 28.1*   < > 21.5* 18.0* 14.3* 11.4*  NEUTROABS 23.8*  --   --   --   --   --   HGB 8.8*   < > 8.2* 7.9* 7.2* 7.3*  HCT 29.9*   < > 27.7* 26.3* 24.1* 24.2*  MCV 96.8   < > 97.2 97.4 94.9 93.8  PLT 267   < > 273 254 206 220   < > = values in this interval not displayed.     Medications:    . amiodarone  200 mg Oral BID  . apixaban  5 mg Oral BID  . chlorhexidine gluconate (MEDLINE KIT)  15 mL Mouth Rinse BID  . Chlorhexidine Gluconate Cloth  6 each Topical Daily  . clopidogrel  75 mg Oral Daily  . darbepoetin (ARANESP) injection - DIALYSIS  100 mcg Intravenous Q Sat-HD  . feeding supplement (PRO-STAT SUGAR FREE 64)  60 mL Per Tube BID  . insulin aspart  0-9 Units Subcutaneous Q4H  . insulin glargine  6 Units Subcutaneous Daily  . mouth rinse  15 mL Mouth Rinse TID AC & HS  . midodrine  20 mg Oral TID WC  . multivitamin  1 tablet Oral QHS  . pantoprazole  40 mg Oral  Daily  . rosuvastatin  10 mg Oral q1800  . sodium chloride flush  10-40 mL Intracatheter Q12H  . vancomycin variable dose per unstable renal function (pharmacist dosing)   Does not apply See admin instructions   Elmarie Shiley, MD 11/27/2019, 6:54 AM

## 2019-11-27 NOTE — Progress Notes (Signed)
Pharmacy Antibiotic Note  CHIKE FARRINGTON is a 64 y.o. male admitted on 10/24/2019 with cellulitis.  Pharmacy has been consulted for Vancomycin dosing. Patient has ESRD and was on CRRT until 4/28 AM. Had session of iHD on 5/1, vancomycin dose given appropriately after session.  Plan: No scheduled Vancomycin at this time - will follow dialysis plans.    Height: 5' 6.5" (168.9 cm) Weight: 85.6 kg (188 lb 11.4 oz) IBW/kg (Calculated) : 64.95  Temp (24hrs), Avg:98.3 F (36.8 C), Min:97.8 F (36.6 C), Max:99.7 F (37.6 C)  Recent Labs  Lab 11/23/19 0441 11/23/19 0441 11/23/19 1624 11/24/19 0524 11/25/19 0429 11/25/19 0750 11/26/19 0500 11/27/19 0230  WBC 28.1*  --   --  21.5* 18.0*  --  14.3* 11.4*  CREATININE 1.69*   < > 2.61* 3.92*  --  6.56* 8.06* 5.06*   < > = values in this interval not displayed.    Estimated Creatinine Clearance: 15.1 mL/min (A) (by C-G formula based on SCr of 5.06 mg/dL (H)).    Allergies  Allergen Reactions  . Baclofen Other (See Comments)    Possibly stroke like symptoms  . Iodinated Diagnostic Agents Swelling, Rash and Other (See Comments)    Other Reaction: burning to mouth, swelling of lips  . Lipitor [Atorvastatin] Other (See Comments)    Leg pain  . Metoprolol Other (See Comments)    Headaches     Antimicrobials this admission: Vancomycin 4/9, 4/11>> 4/17, 4/29 >  Meropenem 4/11>> 4/17 CTX 4/29>>  Dose adjustments this admission:   Microbiology results: 3/29 COVID/flu: neg 3/31 MRSA: neg 4/8 sputum: normal flora 4/8 BCx: ngF 4/11 BCx: ngF 4/14 BCx: ngF 4/22 BCx: ng 4/23 TA: reincubated 4/23 cdiff: neg 4/28 Bcx: ngtd  4/30 chest wound: few enterococcus faecium - reincubated  Thank you for allowing pharmacy to be a part of this patient's care.  Marguerite Olea, [facility] Clinical Pharmacist Phone [phone]  11/27/2019 10:02 AM

## 2019-11-28 ENCOUNTER — Encounter (HOSPITAL_COMMUNITY): Payer: Self-pay | Admitting: Certified Registered Nurse Anesthetist

## 2019-11-28 ENCOUNTER — Encounter (HOSPITAL_COMMUNITY)
Admission: EM | Disposition: A | Discharge status: Nursing Home (Includes ICF) | Payer: Self-pay | Source: Home / Self Care | Attending: Cardiothoracic Surgery

## 2019-11-28 DIAGNOSIS — I214 Non-ST elevation (NSTEMI) myocardial infarction: Secondary | ICD-10-CM | POA: Diagnosis not present

## 2019-11-28 DIAGNOSIS — N186 End stage renal disease: Secondary | ICD-10-CM | POA: Diagnosis not present

## 2019-11-28 LAB — COOXEMETRY PANEL
Carboxyhemoglobin: 1.4 % (ref 0.5–1.5)
Methemoglobin: 0.6 % (ref 0.0–1.5)
O2 Saturation: 57.2 %
Total hemoglobin: 11.9 g/dL — ABNORMAL LOW (ref 12.0–16.0)

## 2019-11-28 LAB — GLUCOSE, CAPILLARY
Glucose-Capillary: 109 mg/dL — ABNORMAL HIGH (ref 70–99)
Glucose-Capillary: 119 mg/dL — ABNORMAL HIGH (ref 70–99)
Glucose-Capillary: 89 mg/dL (ref 70–99)
Glucose-Capillary: 185 mg/dL — ABNORMAL HIGH (ref 70–99)
Glucose-Capillary: 111 mg/dL — ABNORMAL HIGH (ref 70–99)
Glucose-Capillary: 180 mg/dL — ABNORMAL HIGH (ref 70–99)

## 2019-11-28 LAB — CULTURE, BLOOD (ROUTINE X 2)
Culture: NO GROWTH
Culture: NO GROWTH

## 2019-11-28 LAB — CBC
HCT: 23.5 % — ABNORMAL LOW (ref 39.0–52.0)
Hemoglobin: 7.2 g/dL — ABNORMAL LOW (ref 13.0–17.0)
MCH: 29 pg (ref 26.0–34.0)
MCHC: 30.6 g/dL (ref 30.0–36.0)
MCV: 94.8 fL (ref 80.0–100.0)
Platelets: 238 10*3/uL (ref 150–400)
RBC: 2.48 MIL/uL — ABNORMAL LOW (ref 4.22–5.81)
RDW: 20.8 % — ABNORMAL HIGH (ref 11.5–15.5)
WBC: 13 10*3/uL — ABNORMAL HIGH (ref 4.0–10.5)
nRBC: 0.4 % — ABNORMAL HIGH (ref 0.0–0.2)

## 2019-11-28 LAB — BASIC METABOLIC PANEL
Anion gap: 14 (ref 5–15)
BUN: 82 mg/dL — ABNORMAL HIGH (ref 8–23)
CO2: 20 mmol/L — ABNORMAL LOW (ref 22–32)
Calcium: 6.2 mg/dL — CL (ref 8.9–10.3)
Chloride: 102 mmol/L (ref 98–111)
Creatinine, Ser: 7.6 mg/dL — ABNORMAL HIGH (ref 0.61–1.24)
GFR calc Af Amer: 8 mL/min — ABNORMAL LOW (ref >60)
GFR calc non Af Amer: 7 mL/min — ABNORMAL LOW (ref >60)
Glucose, Bld: 129 mg/dL — ABNORMAL HIGH (ref 70–99)
Potassium: 3.9 mmol/L (ref 3.5–5.1)
Sodium: 136 mmol/L (ref 135–145)

## 2019-11-28 SURGERY — DEBRIDEMENT, WOUND, STERNUM
Anesthesia: General

## 2019-11-28 MED ORDER — PROPOFOL 10 MG/ML IV BOLUS
INTRAVENOUS | Status: AC
  Filled 2019-11-28: qty 40

## 2019-11-28 MED ORDER — FENTANYL CITRATE (PF) 250 MCG/5ML IJ SOLN
INTRAMUSCULAR | Status: AC
  Filled 2019-11-28: qty 5

## 2019-11-28 MED ORDER — JUVEN PO PACK
1.0000 | PACK | Freq: Two times a day (BID) | ORAL | Status: DC
  Administered 2019-11-30 – 2019-12-02 (×4): 1
  Filled 2019-11-28 (×4): qty 1

## 2019-11-28 MED ORDER — LINEZOLID 600 MG/300ML IV SOLN
600.0000 mg | Freq: Two times a day (BID) | INTRAVENOUS | Status: AC
  Administered 2019-11-28 – 2019-12-11 (×28): 600 mg via INTRAVENOUS
  Filled 2019-11-28 (×29): qty 300

## 2019-11-28 MED ORDER — ENSURE ENLIVE PO LIQD
237.0000 mL | Freq: Three times a day (TID) | ORAL | Status: DC
  Administered 2019-11-28 – 2019-12-06 (×18): 237 mL via ORAL

## 2019-11-28 MED ORDER — MIDAZOLAM HCL 2 MG/2ML IJ SOLN
INTRAMUSCULAR | Status: AC
  Filled 2019-11-28: qty 2

## 2019-11-28 MED ORDER — VITAL 1.5 CAL PO LIQD
1000.0000 mL | ORAL | Status: DC
  Administered 2019-11-30: 18:00:00 1000 mL
  Filled 2019-11-28 (×3): qty 1000

## 2019-11-28 MED ORDER — CHLORHEXIDINE GLUCONATE CLOTH 2 % EX PADS
6.0000 | MEDICATED_PAD | Freq: Every day | CUTANEOUS | Status: DC
  Administered 2019-11-29 – 2019-12-02 (×3): 6 via TOPICAL

## 2019-11-28 NOTE — Progress Notes (Signed)
CT surgery p.m. Rounds   Blood pressure (!) 107/47, pulse 87, temperature 98.8 F (37.1 C), resp. rate (!) 25, height 5' 6.5" (1.689 m), weight 86.8 kg, SpO2 100 %.  Patient resting quietly Sternal dressing intact and dry Plan sternal debridement and wound VAC in OR tomorrow N.p.o. after midnight, tolerating mechanical soft diet today

## 2019-11-28 NOTE — Progress Notes (Signed)
Nutrition Follow-up  DOCUMENTATION CODES:   Not applicable  INTERVENTION:  Tube Feeding via Cortrak:  Vital 1.5 at 20 ml/hr Pro-Stat 60 mL BID Provides 1120 kcals, 92 g of protein and 365 mL of free water  Add Juven BID via Cortrak, each packet provides 80 calories, 8 grams of carbohydrate, 2.5  grams of protein (collagen), 7 grams of L-arginine and 7 grams of L-glutamine; supplement contains CaHMB, Vitamins C, E, B12 and Zinc to promote wound healing. Mix with the LEAST amount of water necessary to mix contents  Ensure Enlive po TID, each supplement provides 350 kcal and 20 grams of protein  Recommend checking Vitamin C, Zinc; supplementing if deficient   NUTRITION DIAGNOSIS:   Inadequate oral intake related to acute illness as evidenced by NPO status.  Being addressed via TF      GOAL:   Patient will meet greater than or equal to 90% of their needs  Progressing  MONITOR                                                                                                                             :   PO intake, Diet advancement, Supplement acceptance, Labs, Weight trends, Skin  REASON FOR ASSESSMENT:   Ventilator    ASSESSMENT:   64 yo male admitted with NSTEMI and found to have mutlivessel CAD and underwent CABG on 4/1, ESRD/HD requiring CRRT. PMH includes ESRD on HD s/p renal transplant, CAD s/p CABG, CHF, DM  3/29 Admission 3/30 Cardiac Cath 4/01 CABG with IABP placement(removed 4/3), Intubated 4/02 CRRT initiated 4/04 Extubated 4/05 Cortrak placed,tip in stomach;okay to feed gastric per RD discussion with Dr. Orvan Seen 4/07 CRRT discontinued, ileus, NPO, Cortrak pulled out by pt 4/08 CL diet in AM, FL diet in PM 4/09 iHD with 1.3 L removed 4/10 CRRT re-initiated 4/11 Diet advanced to Dysphagia III 4/13 Diet advanced to Heart Healthy, Carb Modified 4/14 Cortrak placed 4/15 TF off due to abdominal pain 4/23 PEA arrest while supine for CT chest, hyperkalemia  with K+>7.5, Intubated 4/27 Extubated 4/28 CRRT discontinued, TF held for ileus 5/01 HD  Pt going back to OR today for debridement of sternal wound with possible wound vac application today  NPO for procedure at present but on Dysphagia 3. Recorded po intake 25% at breakfast yesterday and eating bites the rest of the day. Pt did not eat breakfast this AM. Pt reports poor appetite and early satiety. Pt takes a few bites and then he is full. At breakfast this AM, pt received scrambled eggs which he does not like. Pt would prefer boiled egg or egg salad in cup with crackers; preferences documented in HT system  Reinforced importance of adequate nutrition with pt and his wife. Wife is concerned re: patient's nutritional status. Discussed adding back Ensure Enlive shakes which pt is agreeable to. Plan to also continue Magic Cups.   TF orders remains active but have not been infusing. Cortrak remains  in place. Discussed possibility of resuming low rate TF for now with pt and his wife to optimized nutrition. Pt and wife are both in agreement. Discussed with MD Atkins and plan to start TF at 20 ml/hr today post procedure  Pt at risk for multiple vitamin deficiencies given long duration of CRRT, poor nutritional intake. Plan to check Vit C and zinc, given deficiencies in these will hinder wound healing, and supplement if deficient  Labs: reviewed Meds: Rena-Vit, ss novolog, lantus    Diet Order:   Diet Order            Diet NPO time specified  Diet effective midnight        DIET DYS 3 Room service appropriate? Yes with Assist; Fluid consistency: Thin  Diet effective now              EDUCATION NEEDS:   No education needs have been identified at this time  Skin:  Skin Assessment: Skin Integrity Issues: Skin Integrity Issues:: Incisions DTI: R toe Stage II: sacrum Unstageable: R toe, L toe Incisions: sternal incision wound with purulent drainage with debridement  Last BM:  4/30 rectal  tube  Height:   Ht Readings from Last 1 Encounters:  11/22/19 5' 6.5" (1.689 m)    Weight:   Wt Readings from Last 1 Encounters:  11/28/19 86.8 kg    Ideal Body Weight:     BMI:  Body mass index is 30.42 kg/m.  Estimated Nutritional Needs:   Kcal:  2300-2700  Protein:  120-140 gm  Fluid:  1000 plus UOP    Kerman Passey MS, RDN, LDN, CNSC RD Pager Number and RD On-Call Pager Number Located in [place]

## 2019-11-28 NOTE — Progress Notes (Signed)
  Speech Language Pathology Treatment: Dysphagia  Patient Details Name: Patrick [name] MRN: [id number] DOB: 11-Oct-1955 Today's Date: 11/28/2019 Time: 9200-4159 SLP Time Calculation (min) (ACUTE ONLY): 8 min  Assessment / Plan / Recommendation Clinical Impression  Pt seen during lunch meal. He is able to self feed precut fish with extra time for mastication and liquid wash to assist in oral clearance. Rate of intake is slow and appropriate. SLP provided some basic tips to increase safety and improve intake - better positioning and after feeling full, waiting a bit and coming back to uneaten items on tray. Pts family at bedside reports that he appears back to baseline as far as eating behavior, intake just needs to improve. Recommend pt continue mech soft diet through admission unless he requests upgrade. Will sign off at this time.   HPI HPI: 64 year old male with PMH of CABG in 1997 s/p stent, ESRD on HD s/p renal transplant, presented to [facility] 3/29 with CP, SOB. Dx NSTEMI and multivessel CAD. Underwent 3vCABG and AVR 4/1 and balloon pump placement. Intubated 4/1 for surgery, extubated 4/4. PEA 4/23 and reintubated for4 days.      SLP Plan  All goals met       Recommendations  Diet recommendations: Dysphagia 3 (mechanical soft);Thin liquid Liquids provided via: Cup;Straw Medication Administration: Whole meds with liquid Supervision: Patient able to self feed Compensations: Slow rate;Clear throat intermittently Postural Changes and/or Swallow Maneuvers: Seated upright 90 degrees                Follow up Recommendations: Inpatient Rehab SLP Visit Diagnosis: Dysphagia, oral phase (R13.11) Plan: All goals met       GO               Herbie [place], MA CCC-SLP  Acute Rehabilitation Services Pager [phone] Office [phone]  Lynann Beaver 11/28/2019, 11:46 AM

## 2019-11-28 NOTE — Progress Notes (Signed)
Patient ID: Patrick [name], male   DOB: 1956/07/26, 64 y.o.   MRN: [id number]     Advanced Heart Failure Rounding Note  PCP-Cardiologist: Dorris Carnes, MD     Patient Profile   64 y/o male w/ h/o CAD s/p remote CABG in 1997 w/ LIMA-LAD, failed kidney transplant, end-stage renal disease on dialysis admitted w/ CP. LHC w/ severe CAD. Echo w/ reduced EF 35-40% and moderate AI. S/p redo CABG x 3 on 4/1 (SVG-LAD, SVG-PDA, SVG-OM) + bioprosthetic AVR. Post operative course c/b by cardiogenic shock, respiratory failure, VT, and protein malnutrition.    Subjective:    Patient had respiratory arrest with PEA while in CT scanner 4/23, had 5 minutes CPR with drugs, achieved ROSC and was intubated.  K noted > 7.  Patrick [name] was extubated on 4/27.   CVVH stopped 4/28. HD was done on 5/1, had to increase NE afterwards.     Patrick [name] is now off NE, on vasopressin 0.03.  CVP 4-7.   Enterococcus faecium from wound culture, Patrick [name] is on vancomycin and ceftriaxone.   Objective:   Weight Range: 86.8 kg Body mass index is 30.42 kg/m.   Vital Signs:   Temp:  [97.8 F (36.6 C)-100 F (37.8 C)] 100 F (37.8 C) (05/03 0721) Pulse Rate:  [95-96] 95 (05/03 0815) Resp:  [16-38] 19 (05/03 0815) BP: (68-128)/(26-104) 99/45 (05/03 0800) SpO2:  [95 %-100 %] 99 % (05/03 0815) Weight:  [86.8 kg] 86.8 kg (05/03 0645) Last BM Date: 11/28/19  Weight change: Filed Weights   11/26/19 1515 11/27/19 0500 11/28/19 0645  Weight: 85.6 kg 85.6 kg 86.8 kg    Intake/Output:   Intake/Output Summary (Last 24 hours) at 11/28/2019 0903 Last data filed at 11/28/2019 0800 Gross per 24 hour  Intake 451.14 ml  Output 385 ml  Net 66.14 ml      Physical Exam    CVP 4-7 General: NAD Neck: No JVD, no thyromegaly or thyroid nodule.  Lungs: Clear to auscultation bilaterally with normal respiratory effort. CV: Nondisplaced PMI.  Heart regular S1/S2, no S3/S4, no murmur.  No peripheral edema.   Abdomen: Soft, nontender, no  hepatosplenomegaly, no distention.  Skin: Intact without lesions or rashes.  Neurologic: Alert and oriented x 3.  Psych: Normal affect. Extremities: No clubbing or cyanosis.  HEENT: Normal.    Telemetry   A-V sequential pacing.  I turned down his pacemaker and Patrick [name] is in NSR at 88.   Labs    CBC Recent Labs    11/27/19 0230 11/28/19 0333  WBC 11.4* 13.0*  HGB 7.3* 7.2*  HCT 24.2* 23.5*  MCV 93.8 94.8  PLT 220 341   Basic Metabolic Panel Recent Labs    11/27/19 0230 11/28/19 0333  NA 135 136  K 3.6 3.9  CL 101 102  CO2 22 20*  GLUCOSE 171* 129*  BUN 56* 82*  CREATININE 5.06* 7.60*  CALCIUM 6.5* 6.2*   Liver Function Tests No results for input(s): AST, ALT, ALKPHOS, BILITOT, PROT, ALBUMIN in the last 72 hours. No results for input(s): LIPASE, AMYLASE in the last 72 hours. Cardiac Enzymes No results for input(s): CKTOTAL, CKMB, CKMBINDEX, TROPONINI in the last 72 hours.  BNP: BNP (last 3 results) Recent Labs    11/20/19 0353 11/21/19 0410  BNP 374.6* 519.5*    ProBNP (last 3 results) No results for input(s): PROBNP in the last 8760 hours.   D-Dimer No results for input(s): DDIMER in the last 72 hours. Hemoglobin A1C No  results for input(s): HGBA1C in the last 72 hours. Fasting Lipid Panel No results for input(s): CHOL, HDL, LDLCALC, TRIG, CHOLHDL, LDLDIRECT in the last 72 hours. Thyroid Function Tests No results for input(s): TSH, T4TOTAL, T3FREE, THYROIDAB in the last 72 hours.  Invalid input(s): FREET3  Other results:   Imaging    No results found.   Medications:     Scheduled Medications: . amiodarone  200 mg Oral BID  . apixaban  5 mg Oral BID  . chlorhexidine gluconate (MEDLINE KIT)  15 mL Mouth Rinse BID  . Chlorhexidine Gluconate Cloth  6 each Topical Daily  . clopidogrel  75 mg Oral Daily  . darbepoetin (ARANESP) injection - DIALYSIS  100 mcg Intravenous Q Sat-HD  . feeding supplement (PRO-STAT SUGAR FREE 64)  60 mL Per Tube  BID  . insulin aspart  0-9 Units Subcutaneous Q4H  . insulin glargine  6 Units Subcutaneous Daily  . mouth rinse  15 mL Mouth Rinse TID AC & HS  . midodrine  20 mg Oral TID WC  . multivitamin  1 tablet Oral QHS  . pantoprazole  40 mg Oral Daily  . rosuvastatin  10 mg Oral q1800  . sodium chloride flush  10-40 mL Intracatheter Q12H  . vancomycin variable dose per unstable renal function (pharmacist dosing)   Does not apply See admin instructions    Infusions: . sodium chloride Stopped (11/21/19 1749)  . sodium chloride    . sodium chloride    . cefTRIAXone (ROCEPHIN)  IV 1 g (11/27/19 1751)  . feeding supplement (VITAL 1.5 CAL) Stopped (11/22/19 1457)  . norepinephrine (LEVOPHED) Adult infusion Stopped (11/28/19 0216)  . vasopressin (PITRESSIN) infusion - *FOR SHOCK* 0.03 Units/min (11/28/19 0800)    PRN Medications: sodium chloride, Place/Maintain arterial line **AND** sodium chloride, Place/Maintain arterial line **AND** sodium chloride, acetaminophen (TYLENOL) oral liquid 160 mg/5 mL, dextrose, diphenhydrAMINE, fentaNYL (SUBLIMAZE) injection, Gerhardt's butt cream, levalbuterol, lip balm, metoprolol tartrate, ondansetron (ZOFRAN) IV, oxyCODONE, phenol, sodium chloride flush   Assessment/Plan   1. Cardiogenic shock: Post-op.  Echo 4/14 with EF 30-35%, stable aortic valve replacement.  Echo on 4/21 with EF 45-50% and significant respirophasic septal variation.  Pericardium in some places appears thickened though windows are poor.  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 CT chest 4/23, difficult images as the patient became unstable during the scan. There did not appear to be significant pericardial pathology. S/p PEA arrest 4/23. CVP now 4-7 with co-ox 57%.  Patrick [name] is off NE and remains on vasopressin 0.03 and midodrine 20 tid.  - Wean off vasopressin for SBP > 85.  2. ESRD:  CVVHD stopped 4/28. Plan to start iHD on TTS schedule.  3. Possible  aspiration PNA: Patrick [name] has completed empiric vanco/meropenem.  4. Atrial fibrillation: Paroxysmal.  When pacemaker rate is turned down this morning, Patrick [name] is in NSR in 80s.  - Continue amiodarone 200 mg bid for now. - Continue apixaban 5 mg bid.  5. Anemia, post-op: Hgb 7.2 today. Monitor closely, transfuses for hgb <7.0  6. CAD: Redo CABG x 3 this admission on 4/1 (SVG-LAD, SVG-PDA, SVG-OM).   - Continue Plavix and Crestor.  7. VT:  Frequent NSVT, now on Amiodarone with improvement. 8. Acute hypoxic respiratory failure: due to pulmonary edema and aspiration PNA.  Re-intubated after PEA arrest 4/23. Extubated again on 4/27.  Has pleural effusions but did not appear amenable to thoracentesis per CCM.  9. ID: Enterococcus faecium on wound culture. Afebrile, WBCs  13.  - Patrick [name] is on vancomcyin/ceftriaxone currently.  10. PEA arrest on 4/23.  Resuscitated with CPR.  Suspect due to hypoxemia.  11. Elevated LFTs: ?Shock liver.  RUQ US unremarkable.  - Trending down. 12. Severe Deconditioning - PT/OT following. Recommending CIR.   13. Ileus: Resolved, getting TFs.  14. Sternal wound: To go to OR today for debridement and wound vac.   Length of Stay: [place], MD  11/28/2019, 9:03 AM  Advanced Heart Failure Team Pager [phone] (M-F; 7a - 4p)

## 2019-11-28 NOTE — TOC Transition Note (Signed)
Transition of Care [facility]) - CM/SW Discharge Note   Patient Details  Name: Patrick [name] MRN: [id number] Date of Birth: 01-08-1956  Transition of [facility] CM/SW Contact:  Curlene Labrum, RN Phone Number: 11/28/2019, 10:53 AM   Clinical Narrative:    Patient had respiratory arrest with PEA while in CT scanner 4/23, had 5 minutes CPR with drugs, achieved ROSC and was intubated. K noted >7. He was extubated on 4/27.  CVVH stopped 4/28. HD was done on 5/1, had to increase NE afterwards.  He is now off NE, on vasopressin 0.03.  CVP 4-7. Enterococcus faecium from wound culture, he is on vancomycin and ceftriaxone.   Case management called Kathee Delton, RNCM with [facility] and she will review medical notes and speak to the director to discuss insurance authorization and proabably placement this week for LTAC.  Waiting on call back from facility for plans for transfer when able.  Case Management met with the patient's wife last week and offer Medicare choice regarding LTAC placement and options are limited due to Bed availability at [facility] and wife accepts [facility] and medical clearance and transfer once the patient is medically stable.  Final next level of care: Long Term Acute Care (LTAC) Barriers to Discharge: Continued Medical Work up   Patient Goals and CMS Choice Patient states their goals for this hospitalization and ongoing recovery are:: Patient's wife receptive to transferring the patient to [facility] when able. CMS Medicare.gov Compare Post Acute Care list provided to:: Patient Represenative (must comment)(Sheila, wife) Choice offered to / list presented to : [facility] POA / [place]  Discharge Placement                       Discharge Plan and Services   Discharge Planning Services: CM Consult Post Acute Care Choice: Long Term Acute Care (LTAC)                               Social Determinants of Health (SDOH)  Interventions     Readmission Risk Interventions Readmission Risk Prevention Plan 11/24/2019  Transportation Screening Complete  Medication Review Press photographer) Complete  PCP or Specialist appointment within 3-5 days of discharge Complete  HRI or Home Care Consult Complete  SW Recovery Care/Counseling Consult Complete  Palliative Care Screening Complete  Skilled Nursing Facility Complete  Some recent data might be hidden

## 2019-11-28 NOTE — Progress Notes (Signed)
CRITICAL VALUE ALERT  Critical Value:  Calcium of 6.2  Date & Time Notied:  8412  Provider Notified: Cardiology

## 2019-11-28 NOTE — Progress Notes (Signed)
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 Pt has been on CRRT but removed 4/11 due to hypotension and VTach with return to CRRT 4/12 pt with ileus. 4/23  While patient undergoing gated CT (to further evaluate pericardium) he had a PEA arrest. CPR done for approximately 3 minutes with ROSC and patient intubated.  PMHx: ESRD MWF, HTN, PNA, T2DM, MI.   OT comments  Increased ability to assist with bed mobility this session. Stood x 2 with use of Stedy with +2 max A. Able to maintain standing for 8 seconds before needing to sit due to fatigue. Pt did not progress OOB to chair due to surgical procedure this pm per nsg. BP 92/42; HR 101. Brief desat into 80s during mobility on 2L; completed session on 3L. Improved WOB this session.  Wife present for session and encouraging pt throughout. Reviewed bed level exercises with wife and need for proper positioning of LLE, especially decreasing ER and increasing terminal knee extension. Pt/wife verbalized understanding. Pt is very motivated to work with therapy and get stronger. Will continue to follow acutely.   Follow Up Recommendations  LTACH;Supervision/Assistance - 24 hour    Equipment Recommendations  3 in 1 bedside commode;Other (comment)    Recommendations for Other Services Rehab consult    Precautions / Restrictions Precautions Precautions: Fall;Sternal;Other (comment) Precaution Comments: external pacer, flexiseal, cortrak Required Braces or Orthoses: Other Brace Other Brace: B orthotic shoes       Mobility Bed Mobility Overal bed mobility: Needs Assistance Bed Mobility: Supine to Sit     Supine to sit: Mod assist;+2 for physical assistance Sit to supine: Max assist;+2 for physical assistance      Transfers Overall transfer level: Needs assistance    Transfers: Sit to/from Stand Sit to Stand: Max assist;+2 physical assistance         General transfer comment: bed pad used to assist to stand; able to stand x 5 seconds then second trial for 8 seconds    Balance     Sitting balance-Leahy Scale: Fair       Standing balance-Leahy Scale: Poor                             ADL either performed or assessed with clinical judgement   ADL Overall ADL's : Needs assistance/impaired Eating/Feeding: Supervision/ safety;Set up;Sitting   Grooming: Wash/dry face;Wash/dry hands;Oral care;Set up;Supervision/safety;Sitting   Upper Body Bathing: Moderate assistance;Sitting   Lower Body Bathing: Maximal assistance;Bed level                       Functional mobility during ADLs: Maximal assistance;+2 for physical assistance(using Stedy to stand)       Vision       Perception     Praxis      Cognition Arousal/Alertness: Awake/alert Behavior During Therapy: WFL for tasks assessed/performed Overall Cognitive Status: Impaired/Different from baseline Area of Impairment: Problem solving                     Memory: Decreased short-term memory;Decreased recall of precautions       Problem Solving: Slow processing General Comments: multiple repetition of not pushing with LUE for sternal precautions; slower processing. wife staets he is improving  Exercises General Exercises - Upper Extremity Shoulder Flexion: Both;10 reps;Supine Elbow Flexion: Both;10 reps;Seated Elbow Extension: Both;10 reps;Seated Other Exercises Other Exercises: L terminal knee extension Other Exercises: positioning of LLE to decrease hip ER and knee flexion at rest    Shoulder Instructions       General Comments wife present for session, encouragin gpt. States she has been compleint exercises with her husband    Pertinent Vitals/ Pain       Pain Assessment: Faces Faces Pain Scale: Hurts little more Pain Location:  leg pain Pain Descriptors / Indicators: Discomfort;Guarding;Grimacing;Sore Pain Intervention(s): Limited activity within patient's tolerance  Home Living                                          Prior Functioning/Environment              Frequency  Min 2X/week        Progress Toward Goals  OT Goals(current goals can now be found in the care plan section)  Progress towards OT goals: Progressing toward goals  Acute Rehab OT Goals Patient Stated Goal: per wife for pt to get well and go home OT Goal Formulation: With patient/family Time For Goal Achievement: 12/05/19 Potential to Achieve Goals: Good ADL Goals Pt Will Perform Grooming: with min guard assist;sitting Pt Will Perform Lower Body Dressing: with mod assist;sitting/lateral leans;sit to/from stand Pt Will Transfer to Toilet: with mod assist;stand pivot transfer;bedside commode Pt Will Perform Toileting - Clothing Manipulation and hygiene: with mod assist;sitting/lateral leans;sit to/from stand;with adaptive equipment Pt/caregiver will Perform Home Exercise Program: Increased ROM;With written HEP provided Additional ADL Goal #1: Pt will tolerate x10 mins of ADL tasks with O2 sats >90% with 1 rest break in order to increase activity tolerance.  Plan Discharge plan remains appropriate    Co-evaluation                 AM-PAC OT "6 Clicks" Daily Activity     Outcome Measure   Help from another person eating meals?: A Little Help from another person taking care of personal grooming?: A Little Help from another person toileting, which includes using toliet, bedpan, or urinal?: Total Help from another person bathing (including washing, rinsing, drying)?: A Lot Help from another person to put on and taking off regular upper body clothing?: A Lot Help from another person to put on and taking off regular lower body clothing?: Total 6 Click Score: 12    End of Session Equipment Utilized During  Treatment: Oxygen(3L during mobility)  OT Visit Diagnosis: Unsteadiness on feet (R26.81);Other abnormalities of gait and mobility (R26.89);Muscle weakness (generalized) (M62.81);Pain;Other symptoms and signs involving cognitive function Pain - part of body: (B legs)   Activity Tolerance Patient limited by fatigue   Patient Left in bed;with call bell/phone within reach;with bed alarm set   Nurse Communication Mobility status        Time: 6789-3810 OT Time Calculation (min): 35 min  Charges: OT General Charges $OT Visit: 1 Visit OT Treatments $Self Care/Home Management : 8-22 mins $Therapeutic Activity: 8-22 mins  Maurie Boettcher, OT/L   Acute OT Clinical Specialist Acute Rehabilitation Services Pager [phone] Office [phone]    [facility] 11/28/2019, 6:06 PM

## 2019-11-28 NOTE — Progress Notes (Signed)
[facility] Progress Note  Subjective: no c/o today  Vitals:   11/28/19 1130 11/28/19 1145 11/28/19 1200 11/28/19 1245  BP: (!) 82/73  (!) 92/42 91/66  Pulse: 87 97 (!) 102 91  Resp: (!) 28 (!) 26 (!) 29 (!) 34  Temp:      TempSrc:      SpO2: 100% 99% 99% 100%  Weight:      Height:         Physical Exam: Gen: Comfortably resting in bed, on oxygen via [place] CVS: Pulse regular rhythm, normal rate, S1 and S2 normal.  Intact sternotomy scar/staples with some drainage noted on the lower aspect of the wound Resp: Anteriorly clear to auscultation, no rales/rhonchi.  Left IJ TDC Abd: Soft, obese, nontender Ext: No lower extremity edema.  OP HD: [name] MWF   4h 400/800   99.5kg   2K/3.5Ca   TDC   Heparin 3000 -Mircera 75 (last 3/22)  -Hectorol 1     Assessment/ Plan: 1.  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  On vaso gtt, off levo today.  Plans noted for debridement of sternal incision with possible wound VAC placement. 2. ESRD: Previously on CRRT due to hemodynamic instability, now back on iHD. Next HD tomorrow. Wt's are down -5L from admit, will plan HD w/o UF tomorrow.  3. Anemia: Downtrending hemoglobin and hematocrit without overt loss, he got ESA yesterday. 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 5. Nutrition: On dysphagia 3 diet with aspiration precautions based on results of swallow test. 6.  Atrial fibrillation: With controlled ventricular response, remains on pressors.   Rob Rashun Grattan 11/28/2019, 12:53 PM   Recent Labs  Lab 11/23/19 1624 11/23/19 1624 11/24/19 0524 11/25/19 0429 11/27/19 0230 11/28/19 0333  K 4.2   < > 4.1   < > 3.6 3.9  BUN 50*   < > 73*   < > 56* 82*  CREATININE 2.61*   < > 3.92*   < > 5.06* 7.60*  CALCIUM 7.1*   < > 6.9*   < > 6.5* 6.2*  PHOS 2.9  --  3.1  --   --   --    HGB  --   --  8.2*   < > 7.3* 7.2*   < > = values in this interval not displayed.   Inpatient medications: . amiodarone  200 mg Oral BID  . apixaban  5 mg Oral BID  . chlorhexidine gluconate (MEDLINE KIT)  15 mL Mouth Rinse BID  . Chlorhexidine Gluconate Cloth  6 each Topical Daily  . clopidogrel  75 mg Oral Daily  . darbepoetin (ARANESP) injection - DIALYSIS  100 mcg Intravenous Q Sat-HD  . feeding supplement (ENSURE ENLIVE)  237 mL Oral TID BM  . feeding supplement (PRO-STAT SUGAR FREE 64)  60 mL Per Tube BID  . insulin aspart  0-9 Units Subcutaneous Q4H  . insulin glargine  6 Units Subcutaneous Daily  . mouth rinse  15 mL Mouth Rinse TID AC & HS  . midodrine  20 mg Oral TID WC  . multivitamin  1 tablet Oral QHS  . pantoprazole  40 mg Oral Daily  . rosuvastatin  10 mg Oral q1800  . sodium chloride flush  10-40 mL Intracatheter Q12H   . sodium chloride Stopped (11/21/19 1749)  . sodium chloride    . sodium chloride    .  feeding supplement (VITAL 1.5 CAL) Stopped (11/22/19 1457)  . linezolid (ZYVOX) IV    . norepinephrine (LEVOPHED) Adult infusion Stopped (11/28/19 0216)  . vasopressin (PITRESSIN) infusion - *FOR SHOCK* 0.01 Units/min (11/28/19 1200)   sodium chloride, Place/Maintain arterial line **AND** sodium chloride, Place/Maintain arterial line **AND** sodium chloride, acetaminophen (TYLENOL) oral liquid 160 mg/5 mL, dextrose, diphenhydrAMINE, fentaNYL (SUBLIMAZE) injection, Gerhardt's butt cream, levalbuterol, lip balm, metoprolol tartrate, ondansetron (ZOFRAN) IV, oxyCODONE, phenol, sodium chloride flush

## 2019-11-29 ENCOUNTER — Inpatient Hospital Stay (HOSPITAL_COMMUNITY): Payer: Medicare HMO | Admitting: Certified Registered"

## 2019-11-29 ENCOUNTER — Inpatient Hospital Stay (HOSPITAL_COMMUNITY): Admission: RE | Admit: 2019-11-29 | Payer: Medicare HMO | Source: Ambulatory Visit

## 2019-11-29 ENCOUNTER — Encounter: Payer: Medicare HMO | Admitting: Vascular Surgery

## 2019-11-29 ENCOUNTER — Encounter (HOSPITAL_COMMUNITY)
Admission: EM | Disposition: A | Discharge status: Nursing Home (Includes ICF) | Payer: Self-pay | Source: Home / Self Care | Attending: Cardiothoracic Surgery

## 2019-11-29 DIAGNOSIS — R570 Cardiogenic shock: Secondary | ICD-10-CM | POA: Diagnosis not present

## 2019-11-29 DIAGNOSIS — N186 End stage renal disease: Secondary | ICD-10-CM | POA: Diagnosis not present

## 2019-11-29 DIAGNOSIS — I9789 Other postprocedural complications and disorders of the circulatory system, not elsewhere classified: Secondary | ICD-10-CM

## 2019-11-29 HISTORY — PX: STERNAL WOUND DEBRIDEMENT: SHX1058

## 2019-11-29 HISTORY — PX: APPLICATION OF WOUND VAC: SHX5189

## 2019-11-29 LAB — BASIC METABOLIC PANEL
Anion gap: 13 (ref 5–15)
BUN: 98 mg/dL — ABNORMAL HIGH (ref 8–23)
CO2: 18 mmol/L — ABNORMAL LOW (ref 22–32)
Calcium: 5.9 mg/dL — CL (ref 8.9–10.3)
Chloride: 101 mmol/L (ref 98–111)
Creatinine, Ser: 9.35 mg/dL — ABNORMAL HIGH (ref 0.61–1.24)
GFR calc Af Amer: 6 mL/min — ABNORMAL LOW (ref >60)
GFR calc non Af Amer: 5 mL/min — ABNORMAL LOW (ref >60)
Glucose, Bld: 213 mg/dL — ABNORMAL HIGH (ref 70–99)
Potassium: 4 mmol/L (ref 3.5–5.1)
Sodium: 132 mmol/L — ABNORMAL LOW (ref 135–145)

## 2019-11-29 LAB — CBC
HCT: 25.1 % — ABNORMAL LOW (ref 39.0–52.0)
Hemoglobin: 7.6 g/dL — ABNORMAL LOW (ref 13.0–17.0)
MCH: 28.4 pg (ref 26.0–34.0)
MCHC: 30.3 g/dL (ref 30.0–36.0)
MCV: 93.7 fL (ref 80.0–100.0)
Platelets: 296 10*3/uL (ref 150–400)
RBC: 2.68 MIL/uL — ABNORMAL LOW (ref 4.22–5.81)
RDW: 20.4 % — ABNORMAL HIGH (ref 11.5–15.5)
WBC: 14.7 10*3/uL — ABNORMAL HIGH (ref 4.0–10.5)
nRBC: 0.7 % — ABNORMAL HIGH (ref 0.0–0.2)

## 2019-11-29 LAB — GLUCOSE, CAPILLARY
Glucose-Capillary: 119 mg/dL — ABNORMAL HIGH (ref 70–99)
Glucose-Capillary: 127 mg/dL — ABNORMAL HIGH (ref 70–99)
Glucose-Capillary: 128 mg/dL — ABNORMAL HIGH (ref 70–99)
Glucose-Capillary: 132 mg/dL — ABNORMAL HIGH (ref 70–99)
Glucose-Capillary: 138 mg/dL — ABNORMAL HIGH (ref 70–99)
Glucose-Capillary: 194 mg/dL — ABNORMAL HIGH (ref 70–99)
Glucose-Capillary: 199 mg/dL — ABNORMAL HIGH (ref 70–99)
Glucose-Capillary: 241 mg/dL — ABNORMAL HIGH (ref 70–99)
Glucose-Capillary: 62 mg/dL — ABNORMAL LOW (ref 70–99)

## 2019-11-29 LAB — COOXEMETRY PANEL
Carboxyhemoglobin: 1.7 % — ABNORMAL HIGH (ref 0.5–1.5)
Methemoglobin: 1 % (ref 0.0–1.5)
O2 Saturation: 57.3 %
Total hemoglobin: 9.1 g/dL — ABNORMAL LOW (ref 12.0–16.0)

## 2019-11-29 SURGERY — DEBRIDEMENT, WOUND, STERNUM
Anesthesia: Monitor Anesthesia Care

## 2019-11-29 MED ORDER — 0.9 % SODIUM CHLORIDE (POUR BTL) OPTIME
TOPICAL | Status: DC | PRN
Start: 2019-11-29 — End: 2019-11-29
  Administered 2019-11-29: 14:00:00 1000 mL

## 2019-11-29 MED ORDER — PROPOFOL 10 MG/ML IV BOLUS
INTRAVENOUS | Status: AC
  Filled 2019-11-29: qty 20

## 2019-11-29 MED ORDER — ONDANSETRON HCL 4 MG/2ML IJ SOLN
INTRAMUSCULAR | Status: DC | PRN
  Administered 2019-11-29: 4 mg via INTRAVENOUS

## 2019-11-29 MED ORDER — DEXAMETHASONE SODIUM PHOSPHATE 10 MG/ML IJ SOLN
INTRAMUSCULAR | Status: DC | PRN
  Administered 2019-11-29: 4 mg via INTRAVENOUS

## 2019-11-29 MED ORDER — VASOPRESSIN 20 UNIT/ML IV SOLN
INTRAVENOUS | Status: AC
  Filled 2019-11-29: qty 1

## 2019-11-29 MED ORDER — DEXAMETHASONE SODIUM PHOSPHATE 10 MG/ML IJ SOLN
INTRAMUSCULAR | Status: AC
  Filled 2019-11-29: qty 1

## 2019-11-29 MED ORDER — MIDAZOLAM HCL 5 MG/5ML IJ SOLN
INTRAMUSCULAR | Status: DC | PRN
  Administered 2019-11-29 (×2): 1 mg via INTRAVENOUS

## 2019-11-29 MED ORDER — FENTANYL CITRATE (PF) 250 MCG/5ML IJ SOLN
INTRAMUSCULAR | Status: DC | PRN
  Administered 2019-11-29 (×2): 25 ug via INTRAVENOUS

## 2019-11-29 MED ORDER — VASOPRESSIN 20 UNIT/ML IV SOLN
INTRAVENOUS | Status: DC | PRN
Start: 2019-11-29 — End: 2019-11-29
  Administered 2019-11-29 (×2): 2 [IU] via INTRAVENOUS

## 2019-11-29 MED ORDER — PHENYLEPHRINE 40 MCG/ML (10ML) SYRINGE FOR IV PUSH (FOR BLOOD PRESSURE SUPPORT)
PREFILLED_SYRINGE | INTRAVENOUS | Status: DC | PRN
  Administered 2019-11-29: 80 ug via INTRAVENOUS
  Administered 2019-11-29: 120 ug via INTRAVENOUS

## 2019-11-29 MED ORDER — ONDANSETRON HCL 4 MG/2ML IJ SOLN
INTRAMUSCULAR | Status: AC
  Filled 2019-11-29: qty 2

## 2019-11-29 MED ORDER — SODIUM CHLORIDE 0.9 % IR SOLN
Status: DC | PRN
  Administered 2019-11-29: 3000 mL

## 2019-11-29 MED ORDER — HEPARIN SODIUM (PORCINE) 1000 UNIT/ML IJ SOLN
INTRAMUSCULAR | Status: AC
  Administered 2019-11-29: 11:00:00 1000 [IU]
  Filled 2019-11-29: qty 4

## 2019-11-29 MED ORDER — HEPARIN SODIUM (PORCINE) 1000 UNIT/ML IJ SOLN
INTRAMUSCULAR | Status: AC
  Administered 2019-11-29: 08:00:00 1500 [IU]
  Filled 2019-11-29: qty 4

## 2019-11-29 MED ORDER — GLYCOPYRROLATE PF 0.2 MG/ML IJ SOSY
PREFILLED_SYRINGE | INTRAMUSCULAR | Status: AC
  Filled 2019-11-29: qty 1

## 2019-11-29 MED ORDER — DEXTROSE 50 % IV SOLN
INTRAVENOUS | Status: AC
  Filled 2019-11-29: qty 50

## 2019-11-29 MED ORDER — EPHEDRINE SULFATE-NACL 50-0.9 MG/10ML-% IV SOSY
PREFILLED_SYRINGE | INTRAVENOUS | Status: DC | PRN
  Administered 2019-11-29: 5 mg via INTRAVENOUS
  Administered 2019-11-29: 10 mg via INTRAVENOUS

## 2019-11-29 MED ORDER — MIDAZOLAM HCL 2 MG/2ML IJ SOLN
INTRAMUSCULAR | Status: AC
  Filled 2019-11-29: qty 2

## 2019-11-29 MED ORDER — FENTANYL CITRATE (PF) 250 MCG/5ML IJ SOLN
INTRAMUSCULAR | Status: AC
  Filled 2019-11-29: qty 5

## 2019-11-29 MED ORDER — SODIUM CHLORIDE 0.9 % IV SOLN: INTRAVENOUS | Status: DC | PRN

## 2019-11-29 MED ORDER — PROPOFOL 500 MG/50ML IV EMUL
INTRAVENOUS | Status: DC | PRN
Start: 2019-11-29 — End: 2019-11-29
  Administered 2019-11-29: 50 ug/kg/min via INTRAVENOUS

## 2019-11-29 MED ORDER — SODIUM CHLORIDE 0.9 % IV SOLN
INTRAVENOUS | Status: DC
  Administered 2019-12-04: 18:00:00 250 mL via INTRAVENOUS

## 2019-11-29 SURGICAL SUPPLY — 55 items
BAG DECANTER FOR FLEXI CONT (MISCELLANEOUS) IMPLANT
BENZOIN TINCTURE PRP APPL 2/3 (GAUZE/BANDAGES/DRESSINGS) IMPLANT
BLADE CLIPPER SURG (BLADE) IMPLANT
BLADE SURG 10 STRL SS (BLADE) IMPLANT
CANISTER SUCT 3000ML PPV (MISCELLANEOUS) IMPLANT
CANISTER WOUND CARE 500ML ATS (WOUND CARE) IMPLANT
CANISTER WOUNDNEG PRESSURE 500 (CANNISTER) ×2 IMPLANT
CLIP VESOCCLUDE SM WIDE 24/CT (CLIP) IMPLANT
CNTNR URN SCR LID CUP LEK RST (MISCELLANEOUS) IMPLANT
CONT SPEC 4OZ STRL OR WHT (MISCELLANEOUS)
DRAPE CARDIOVASCULAR INCISE (DRAPES)
DRAPE LAPAROSCOPIC ABDOMINAL (DRAPES) IMPLANT
DRAPE SLUSH/WARMER DISC (DRAPES) ×2 IMPLANT
DRAPE SRG 135X102X78XABS (DRAPES) IMPLANT
DRSG AQUACEL AG ADV 3.5X14 (GAUZE/BANDAGES/DRESSINGS) IMPLANT
DRSG VAC ATS LRG SENSATRAC (GAUZE/BANDAGES/DRESSINGS) ×2 IMPLANT
DRSG VAC ATS MED SENSATRAC (GAUZE/BANDAGES/DRESSINGS) IMPLANT
DRSG VAC ATS SM SENSATRAC (GAUZE/BANDAGES/DRESSINGS) IMPLANT
DRSG XEROFORM 1X8 (GAUZE/BANDAGES/DRESSINGS) ×2 IMPLANT
ELECT REM PT RETURN 9FT ADLT (ELECTROSURGICAL) ×2
ELECTRODE REM PT RTRN 9FT ADLT (ELECTROSURGICAL) ×1 IMPLANT
GAUZE SPONGE 4X4 12PLY STRL (GAUZE/BANDAGES/DRESSINGS) ×2 IMPLANT
GAUZE SPONGE 4X4 12PLY STRL LF (GAUZE/BANDAGES/DRESSINGS) ×2 IMPLANT
GAUZE XEROFORM 5X9 LF (GAUZE/BANDAGES/DRESSINGS) IMPLANT
GLOVE BIO SURGEON STRL SZ 6.5 (GLOVE) ×4 IMPLANT
GLOVE NEODERM STRL 7.5 LF PF (GLOVE) ×1 IMPLANT
GLOVE SURG NEODERM 7.5  LF PF (GLOVE) ×1
GOWN STRL REUS W/ TWL LRG LVL3 (GOWN DISPOSABLE) ×3 IMPLANT
GOWN STRL REUS W/TWL LRG LVL3 (GOWN DISPOSABLE) ×3
HANDPIECE INTERPULSE COAX TIP (DISPOSABLE) ×1
HEMOSTAT POWDER SURGIFOAM 1G (HEMOSTASIS) IMPLANT
HEMOSTAT SURGICEL 2X14 (HEMOSTASIS) IMPLANT
KIT BASIN OR (CUSTOM PROCEDURE TRAY) ×2 IMPLANT
KIT TURNOVER KIT B (KITS) ×2 IMPLANT
MARKER SKIN DUAL TIP RULER LAB (MISCELLANEOUS) IMPLANT
NS IRRIG 1000ML POUR BTL (IV SOLUTION) ×2 IMPLANT
PACK CHEST (CUSTOM PROCEDURE TRAY) IMPLANT
PACK GENERAL/GYN (CUSTOM PROCEDURE TRAY) ×2 IMPLANT
PACK UNIVERSAL I (CUSTOM PROCEDURE TRAY) ×2 IMPLANT
PAD ARMBOARD 7.5X6 YLW CONV (MISCELLANEOUS) ×4 IMPLANT
PENCIL BUTTON HOLSTER BLD 10FT (ELECTRODE) ×2 IMPLANT
SET HNDPC FAN SPRY TIP SCT (DISPOSABLE) ×1 IMPLANT
SOL PREP POV-IOD 4OZ 10% (MISCELLANEOUS) IMPLANT
SUT ETHILON 3 0 FSL (SUTURE) IMPLANT
SUT MNCRL AB 3-0 PS2 18 (SUTURE) IMPLANT
SUT PDS AB 1 CTX 36 (SUTURE) IMPLANT
SUT STEEL 6MS V (SUTURE) IMPLANT
SUT STEEL STERNAL CCS#1 18IN (SUTURE) IMPLANT
SUT STEEL SZ 6 DBL 3X14 BALL (SUTURE) IMPLANT
SUT VIC AB 2-0 CTX 27 (SUTURE) IMPLANT
SWAB COLLECTION DEVICE MRSA (MISCELLANEOUS) IMPLANT
SWAB CULTURE ESWAB REG 1ML (MISCELLANEOUS) IMPLANT
TOWEL GREEN STERILE (TOWEL DISPOSABLE) IMPLANT
TOWEL GREEN STERILE FF (TOWEL DISPOSABLE) ×2 IMPLANT
WATER STERILE IRR 1000ML POUR (IV SOLUTION) ×2 IMPLANT

## 2019-11-29 NOTE — Progress Notes (Signed)
Per Aundra Dubin MD, goal SBP > 85.

## 2019-11-29 NOTE — Anesthesia Postprocedure Evaluation (Signed)
Anesthesia Post Note  Patient: Patrick [name]  Procedure(s) Performed: STERNAL WOUND DEBRIDEMENT (N/A ) APPLICATION OF WOUND VAC (N/A )     Patient location during evaluation: PACU Anesthesia Type: MAC Level of consciousness: awake and alert Pain management: pain level controlled Vital Signs Assessment: post-procedure vital signs reviewed and stable Respiratory status: spontaneous breathing Cardiovascular status: stable Anesthetic complications: no    Last Vitals:  Vitals:   11/29/19 1553 11/29/19 1558  BP: (!) 88/47 (!) 103/59  Pulse: 93 92  Resp: (!) 25 (!) 24  Temp:    SpO2: 99% 99%    Last Pain:  Vitals:   11/29/19 1200  TempSrc:   PainSc: 0-No pain                 Nolon Nations

## 2019-11-29 NOTE — Transfer of Care (Signed)
Immediate Anesthesia Transfer of Care Note  Patient: Patrick [name]  Procedure(s) Performed: STERNAL WOUND DEBRIDEMENT (N/A ) APPLICATION OF WOUND VAC (N/A )  Patient Location: PACU  Anesthesia Type:MAC  Level of Consciousness: awake, alert  and oriented  Airway & Oxygen Therapy: Patient Spontanous Breathing and Patient connected to face mask oxygen  Post-op Assessment: Report given to RN and Post -op Vital signs reviewed and stable  Post vital signs: Reviewed and stable  Last Vitals:  Vitals Value Taken Time  BP 129/98 11/29/19 1524  Temp    Pulse 92 11/29/19 1527  Resp 25 11/29/19 1529  SpO2 87 % 11/29/19 1527  Vitals shown include unvalidated device data.  Last Pain:  Vitals:   11/29/19 1200  TempSrc:   PainSc: 0-No pain      Patients Stated Pain Goal: 2 (22/33/61 2244)  Complications: No apparent anesthesia complications

## 2019-11-29 NOTE — Progress Notes (Signed)
[facility] Progress Note  Subjective: pt in OR today  Vitals:   11/29/19 1553 11/29/19 1558 11/29/19 1602 11/29/19 1603  BP: (!) 88/47 (!) 103/59  104/67  Pulse: 93 92 93 93  Resp: (!) 25 (!) 24 (!) 22 (!) 28  Temp:    (!) 97.2 F (36.2 C)  TempSrc:      SpO2: 99% 99% 99% 99%  Weight:      Height:         Physical Exam: Pt not seen  OP HD: [name] MWF   4h 400/800   99.5kg   2K/3.5Ca   TDC   Heparin 3000 -Mircera 75 (last 3/22)  -Hectorol 1     Assessment/ Plan: 1.  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  Off vaso gtt, on levo gtt now. In OR today for I&D of sternal incision with possible wound VAC placement. 2. ESRD: Previously on CRRT due to hemodynamic instability, now back on iHD. Had HD this am. Wt's are down -5L from admit, no fluid off w/ HD today.  3. 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 5. Nutrition: On dysphagia 3 diet with aspiration precautions based on results of swallow test. 6.  Atrial fibrillation: With controlled ventricular response   Rob Mariavictoria Nottingham 11/29/2019, 4:17 PM   Recent Labs  Lab 11/23/19 1624 11/23/19 1624 11/24/19 0524 11/25/19 0429 11/28/19 0333 11/29/19 0343  K 4.2   < > 4.1   < > 3.9 4.0  BUN 50*   < > 73*   < > 82* 98*  CREATININE 2.61*   < > 3.92*   < > 7.60* 9.35*  CALCIUM 7.1*   < > 6.9*   < > 6.2* 5.9*  PHOS 2.9  --  3.1  --   --   --   HGB  --   --  8.2*   < > 7.2* 7.6*   < > = values in this interval not displayed.   Inpatient medications: . amiodarone  200 mg Oral BID  . apixaban  5 mg Oral BID  . Chlorhexidine Gluconate Cloth  6 each Topical Daily  . Chlorhexidine Gluconate Cloth  6 each Topical Q0600  . clopidogrel  75 mg Oral Daily  . darbepoetin  (ARANESP) injection - DIALYSIS  100 mcg Intravenous Q Sat-HD  . feeding supplement (ENSURE ENLIVE)  237 mL Oral TID BM  . feeding supplement (PRO-STAT SUGAR FREE 64)  60 mL Per Tube BID  . insulin aspart  0-9 Units Subcutaneous Q4H  . insulin glargine  6 Units Subcutaneous Daily  . midodrine  20 mg Oral TID WC  . multivitamin  1 tablet Oral QHS  . nutrition supplement (JUVEN)  1 packet Per Tube BID BM  . pantoprazole  40 mg Oral Daily  . rosuvastatin  10 mg Oral q1800  . sodium chloride flush  10-40 mL Intracatheter Q12H   . sodium chloride Stopped (11/21/19 1749)  . sodium chloride    . sodium chloride    . sodium chloride    . feeding supplement (VITAL 1.5 CAL) Stopped (11/28/19 1518)  . linezolid (ZYVOX) IV Stopped (11/29/19 1005)  . norepinephrine (LEVOPHED) Adult infusion 10 mcg/min (11/29/19 1530)  . vasopressin (PITRESSIN) infusion - *FOR SHOCK* Stopped (11/28/19 1302)   sodium chloride, Place/Maintain arterial  line **AND** sodium chloride, Place/Maintain arterial line **AND** sodium chloride, acetaminophen (TYLENOL) oral liquid 160 mg/5 mL, dextrose, diphenhydrAMINE, fentaNYL (SUBLIMAZE) injection, Gerhardt's butt cream, levalbuterol, lip balm, metoprolol tartrate, ondansetron (ZOFRAN) IV, oxyCODONE, phenol, sodium chloride flush

## 2019-11-29 NOTE — Progress Notes (Signed)
      [address]       [place] [address]             [phone]      S/p sternal debridement  BP 118/82   Pulse 73   Temp (!) 97.2 F (36.2 C)   Resp (!) 28   Ht 5' 6.5" (1.689 m)   Wt 88.1 kg   SpO2 99%   BMI 30.88 kg/m   On levophed at 12 mcg/min  Intake/Output Summary (Last 24 hours) at 11/29/2019 1835 Last data filed at 11/29/2019 1800 Gross per 24 hour  Intake 990.61 ml  Output -69 ml  Net 1059.61 ml   On Zyvox  Patrick Dileonardo C. Roxan Hockey, MD [facility] [phone]

## 2019-11-29 NOTE — Progress Notes (Signed)
Patient resting comfortably on 4L [place]. Patient does not wear CPAP at home and has not had a sleep study. Vitals are stable and breath sounds are clear/diminished. No respiratory distress noted. BIPAP is not needed at this time. RT will monitor as needed.

## 2019-11-29 NOTE — Op Note (Signed)
Procedure(s): STERNAL WOUND DEBRIDEMENT APPLICATION OF WOUND VAC Procedure Note  Patrick [name] male 64 y.o. 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 He is taken to the OR for debridement.       Surgeon: Wonda Olds   Assistants: staff  Anesthesia: Monitored Local Anesthesia with Sedation  ASA Class: 5    Procedure Detail  STERNAL WOUND DEBRIDEMENT, APPLICATION OF WOUND VAC  After informed consent, he was taken to the OR at [facility] and placed supine. Anesthesia was performed by IV sedation. The anterior chest was cleansed and draped sterilely. A preoperative timeout was performed. The midsternal wound was debrided mechanically and with pulse-evac irrigation. The sternum itself was stable. A wound vac foam was then cut to fit the defect and covered with Vac drape. This concluded the procedure. All sponge, instruments, and needle counts were correct.   Estimated Blood Loss:  Minimal         Drains: no  Blood Given: none          Specimens: n/a         Implants: none        Complications:  * No complications entered in OR log *         Disposition: ICU - extubated and stable.         Condition: stable

## 2019-11-29 NOTE — Progress Notes (Addendum)
Patient ID: Patrick [name], male   DOB: 09/06/1955, 64 y.o.   MRN: [id number]     Advanced Heart Failure Rounding Note  PCP-Cardiologist: Dorris Carnes, MD   Patient Profile   64 y/o male w/ h/o CAD s/p remote CABG in 1997 w/ LIMA-LAD, failed kidney transplant, end-stage renal disease on dialysis admitted w/ CP. LHC w/ severe CAD. Echo w/ reduced EF 35-40% and moderate AI. S/p redo CABG x 3 on 4/1 (SVG-LAD, SVG-PDA, SVG-OM) + bioprosthetic AVR. Post operative course c/b by cardiogenic shock, respiratory failure, VT, and protein malnutrition.    Subjective:    Patient had respiratory arrest with PEA while in CT scanner 4/23, had 5 minutes CPR with drugs, achieved ROSC and was intubated.  K noted > 7.  He was extubated on 4/27.   CVVH stopped 4/28. Now back on iHD. CVP 4.   Remains on NE 4 + high dose midodrine 20 mg tid. Off VP. Co-ox 57%.   Enterococcus faecium from wound culture, he is on linezolid. AF. WBC trending up slightly, 11>>13>>15. Scheduled for sternal debridement and wound VAC in OR today.   No complaints this morning. Currently getting HD.   Objective:   Weight Range: 86.8 kg Body mass index is 30.42 kg/m.   Vital Signs:   Temp:  [97.7 F (36.5 C)-100 F (37.8 C)] 98.4 F (36.9 C) (05/04 0337) Pulse Rate:  [83-102] 89 (05/04 0703) Resp:  [16-34] 25 (05/04 0703) BP: (81-117)/(31-88) 106/50 (05/04 0703) SpO2:  [93 %-100 %] 93 % (05/04 0703) Last BM Date: 11/28/19  Weight change: Filed Weights   11/26/19 1515 11/27/19 0500 11/28/19 0645  Weight: 85.6 kg 85.6 kg 86.8 kg    Intake/Output:   Intake/Output Summary (Last 24 hours) at 11/29/2019 0710 Last data filed at 11/28/2019 2200 Gross per 24 hour  Intake 1268.16 ml  Output 200 ml  Net 1068.16 ml      Physical Exam    CVP 4  General: chronically ill appearing, moderately obese AAM HEENT: Normal, + NGT Neck: No JVD, no thyromegaly or thyroid nodule.  Lungs: Clear, no wheezing  CV: Nondisplaced PMI.   RRR. No murmurs rubs or gallops Abdomen: Soft, nontender, no hepatosplenomegaly, no distention.  Skin: Intact without lesions or rashes.  Neurologic: Alert and oriented x 3.  Psych: Normal affect. Extremities: No clubbing or cyanosis. No LEE     Telemetry   NSR 80s   Labs    CBC Recent Labs    11/28/19 0333 11/29/19 0343  WBC 13.0* 14.7*  HGB 7.2* 7.6*  HCT 23.5* 25.1*  MCV 94.8 93.7  PLT 238 941   Basic Metabolic Panel Recent Labs    11/28/19 0333 11/29/19 0343  NA 136 132*  K 3.9 4.0  CL 102 101  CO2 20* 18*  GLUCOSE 129* 213*  BUN 82* 98*  CREATININE 7.60* 9.35*  CALCIUM 6.2* 5.9*   Liver Function Tests No results for input(s): AST, ALT, ALKPHOS, BILITOT, PROT, ALBUMIN in the last 72 hours. No results for input(s): LIPASE, AMYLASE in the last 72 hours. Cardiac Enzymes No results for input(s): CKTOTAL, CKMB, CKMBINDEX, TROPONINI in the last 72 hours.  BNP: BNP (last 3 results) Recent Labs    11/20/19 0353 11/21/19 0410  BNP 374.6* 519.5*    ProBNP (last 3 results) No results for input(s): PROBNP in the last 8760 hours.   D-Dimer No results for input(s): DDIMER in the last 72 hours. Hemoglobin A1C No results for input(s): HGBA1C  in the last 72 hours. Fasting Lipid Panel No results for input(s): CHOL, HDL, LDLCALC, TRIG, CHOLHDL, LDLDIRECT in the last 72 hours. Thyroid Function Tests No results for input(s): TSH, T4TOTAL, T3FREE, THYROIDAB in the last 72 hours.  Invalid input(s): FREET3  Other results:   Imaging    No results found.   Medications:     Scheduled Medications: . amiodarone  200 mg Oral BID  . apixaban  5 mg Oral BID  . chlorhexidine gluconate (MEDLINE KIT)  15 mL Mouth Rinse BID  . Chlorhexidine Gluconate Cloth  6 each Topical Daily  . Chlorhexidine Gluconate Cloth  6 each Topical Q0600  . clopidogrel  75 mg Oral Daily  . darbepoetin (ARANESP) injection - DIALYSIS  100 mcg Intravenous Q Sat-HD  . feeding  supplement (ENSURE ENLIVE)  237 mL Oral TID BM  . feeding supplement (PRO-STAT SUGAR FREE 64)  60 mL Per Tube BID  . heparin sodium (porcine)      . heparin sodium (porcine)      . insulin aspart  0-9 Units Subcutaneous Q4H  . insulin glargine  6 Units Subcutaneous Daily  . mouth rinse  15 mL Mouth Rinse TID AC & HS  . midodrine  20 mg Oral TID WC  . multivitamin  1 tablet Oral QHS  . nutrition supplement (JUVEN)  1 packet Per Tube BID BM  . pantoprazole  40 mg Oral Daily  . rosuvastatin  10 mg Oral q1800  . sodium chloride flush  10-40 mL Intracatheter Q12H    Infusions: . sodium chloride Stopped (11/21/19 1749)  . sodium chloride    . sodium chloride    . feeding supplement (VITAL 1.5 CAL) Stopped (11/28/19 1518)  . linezolid (ZYVOX) IV 300 mL/hr at 11/28/19 2200  . norepinephrine (LEVOPHED) Adult infusion 3 mcg/min (11/28/19 2152)  . vasopressin (PITRESSIN) infusion - *FOR SHOCK* Stopped (11/28/19 1302)    PRN Medications: sodium chloride, Place/Maintain arterial line **AND** sodium chloride, Place/Maintain arterial line **AND** sodium chloride, acetaminophen (TYLENOL) oral liquid 160 mg/5 mL, dextrose, diphenhydrAMINE, fentaNYL (SUBLIMAZE) injection, Gerhardt's butt cream, levalbuterol, lip balm, metoprolol tartrate, ondansetron (ZOFRAN) IV, oxyCODONE, phenol, sodium chloride flush   Assessment/Plan   1. Cardiogenic shock: Post-op.  Echo 4/14 with EF 30-35%, stable aortic valve replacement.  Echo on 4/21 with EF 45-50% and significant respirophasic septal variation.  Pericardium in some places appears thickened though windows are poor.  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 CT chest 4/23, difficult images as the patient became unstable during the scan. There did not appear to be significant pericardial pathology. S/p PEA arrest 4/23. CVP 4 with co-ox 57%.  He is off vasopressin and on NE 4 + midodrine 20 tid.  - wean NE as  tolerated, can stay off for SBP > 85 (think will be able to stop after HD).  2. ESRD:  CVVHD stopped 4/28. Back on iHD on TTS schedule. Management per nephrology.  - Getting HD currently.  3. Possible aspiration PNA: He has completed empiric vanco/meropenem.  4. Atrial fibrillation: Paroxysmal.  NSR today, 90s.  - Continue amiodarone 200 mg bid for now. - Continue apixaban 5 mg bid.  5. Anemia, post-op: Hgb 7.6 today. Monitor closely, transfuses for hgb <7.0  6. CAD: Redo CABG x 3 this admission on 4/1 (SVG-LAD, SVG-PDA, SVG-OM).   - Continue Plavix and Crestor.  7. VT:  Frequent NSVT, now on Amiodarone with improvement. 8. Acute hypoxic respiratory failure: due to pulmonary edema and  aspiration PNA.  Re-intubated after PEA arrest 4/23. Extubated again on 4/27.  Has pleural effusions but did not appear amenable to thoracentesis per CCM.  9. ID: Enterococcus faecium on wound culture. Afebrile, WBCs 15.  - He is on linezolid - Plan sternal debridement and wound VAC in OR later today. 10. PEA arrest on 4/23.  Resuscitated with CPR.  Suspect due to hypoxemia.  11. Elevated LFTs: ?Shock liver.  RUQ US unremarkable.  - Trending down. 12. Severe Deconditioning - PT/OT following. Recommending CIR.   13. Ileus: Resolved, getting TFs.  14. Sternal wound: Enterococcus faecium on wound culture. - continue abx therapy w/ linezolid  - To go to OR today for debridement and wound vac.   Length of Stay: [address], PA-C  11/29/2019, 7:10 AM  Advanced Heart Failure Team Pager [phone] (M-F; 7a - 4p)   Patient seen with PA, agree with the above note.   Getting HD today.  He is on NE 4 but off vasopressin.  CVP 4.  He is in NSR, no longer pacing.   General: NAD Neck: No JVD, no thyromegaly or thyroid nodule.  Lungs: Clear to auscultation bilaterally with normal respiratory effort. CV: Nondisplaced PMI.  Heart regular S1/S2, no S3/S4, no murmur.  No peripheral edema.   Abdomen: Soft,  nontender, no hepatosplenomegaly, no distention.  Skin: Intact without lesions or rashes.  Neurologic: Alert and oriented x 3.  Psych: Normal affect. Extremities: No clubbing or cyanosis.  HEENT: Normal.   Making slow progress.  Think we can be more lenient with BP and hopefully get him off norepinephrine later today (after HD and OR).  CVP 4 so do not need aggressive fluid removal at HD.   He will be going to OR later today for chest wound debridement and vac.  Continues on linezolid for Enterococcus from wound.   Loralie Champagne 11/29/2019 7:55 AM

## 2019-11-29 NOTE — Anesthesia Preprocedure Evaluation (Signed)
Anesthesia Evaluation  Patient identified by MRN, date of birth, ID band Patient awake    Reviewed: Allergy & Precautions, NPO status , Patient's Chart, lab work & pertinent test results, reviewed documented beta blocker date and time   Airway Mallampati: II       Dental  (+) Dental Advisory Given   Pulmonary pneumonia, former smoker,    breath sounds clear to auscultation       Cardiovascular hypertension, Pt. on home beta blockers and Pt. on medications + angina + CAD, + Past MI, + Peripheral Vascular Disease and +CHF  + Valvular Problems/Murmurs  Rhythm:Regular Rate:Normal  Echo 11/16/2019 1. There is concern for a small to moderate pericardial effusion but the  study is very poor quality. Possibly it is more pronounsed posteriorly to  the LV. There is enhanced ventricular interdependce which could be see  post pericardiotomy, constriction,  or in tamponade. The study was too poor quality for assessment of MV/TV  inflow. He seems to have had similar septal bounce on prior studies. EF  continues to improve, 45-50%. Overall, clinical correlation is  recommended. CT scan vs TEE may be helpful to  futher delineate the pericardial space.  2. Left ventricular ejection fraction, by estimation, is 45 to 50%. The  left ventricle has mildly decreased function. The left ventricle  demonstrates global hypokinesis. There is mild concentric left ventricular  hypertrophy. Left ventricular diastolic  function could not be evaluated.  3. Right ventricular systolic function is mildly reduced. The right ventricular size is mildly enlarged. Tricuspid regurgitation signal is inadequate for assessing PA pressure.  4. The mitral valve is degenerative. No evidence of mitral valve regurgitation. No evidence of mitral stenosis.  5. The aortic valve has been repaired/replaced. Aortic valve regurgitation is not visualized. There is a 23 mm  bioprosthetic valve present in the aortic position. Procedure Date: 10/27/2019. Echo findings are consistent with normal structure and function of the aortic valve prosthesis. Aortic valve area, by VTI measures 2.61 cm. Aortic valve mean gradient measures 4.0 mmHg. Aortic valve Vmax measures 1.58 m/s.    Neuro/Psych  Headaches,    GI/Hepatic negative GI ROS, Neg liver ROS,   Endo/Other  diabetes  Renal/GU Renal disease     Musculoskeletal   Abdominal   Peds  Hematology   Anesthesia Other Findings   Reproductive/Obstetrics                             Anesthesia Physical  Anesthesia Plan  ASA: III  Anesthesia Plan: MAC   Post-op Pain Management:    Induction: Intravenous  PONV Risk Score and Plan:   Airway Management Planned: Natural Airway  Additional Equipment: None  Intra-op Plan:   Post-operative Plan:   Informed Consent: I have reviewed the patients History and Physical, chart, labs and discussed the procedure including the risks, benefits and alternatives for the proposed anesthesia with the patient or authorized representative who has indicated his/her understanding and acceptance.     Dental advisory given  Plan Discussed with: CRNA  Anesthesia Plan Comments:         Anesthesia Quick Evaluation

## 2019-11-29 NOTE — Progress Notes (Signed)
Hypoglycemic Event  CBG: 62  Treatment: 25 mL D50  Symptoms: none  Follow-up CBG: Time: 1349 CBG Result: 128  Possible Reasons for Event: NPO  Comments/MD notified: n/a    Patrick [name]

## 2019-11-29 NOTE — H&P (Signed)
History and Physical Interval Note:  11/29/2019 7:53 AM  Patrick [name]  has presented today for surgery, with the diagnosis of UNSTABLE ANGINA.  The various methods of treatment have been discussed with the patient and family. After consideration of risks, benefits and other options for treatment, the patient has consented to  PROCEDURE: sternal wound debridement and wound vac placement as a surgical intervention.  The patient's history has been reviewed, patient examined, no change in status, stable for surgery.  I have reviewed the patient's chart and labs.  Questions were answered to the patient's satisfaction.     Wonda Olds

## 2019-11-29 NOTE — Anesthesia Procedure Notes (Signed)
Procedure Name: MAC Date/Time: 11/29/2019 2:56 PM Performed by: Teressa Lower., CRNA Pre-anesthesia Checklist: Patient identified, Emergency Drugs available, Suction available, Patient being monitored and Timeout performed Patient Re-evaluated:Patient Re-evaluated prior to induction Oxygen Delivery Method: Simple face mask

## 2019-11-29 NOTE — TOC Progression Note (Signed)
Transition of Care [facility]) - Progression Note    Patient Details  Name: Patrick [name] MRN: [id number] Date of Birth: 02-25-1956  Transition of Care [facility]) CM/SW [place], RN Phone Number: 11/29/2019, 2:02 PM  Clinical Narrative:    Case management spoke with Dr. Julien Girt on the phone regarding patient's possible admission to [place] [facility].  Patient to have surgical wound vac placed today for sternal infection.  The physician stated that the patient would be much more appropriate for LTAC placement once the patient's infection improves with antibiotics and wound vac placement to assist with decrease or discontinuation of pressors for blood pressure support.  Will continue to follow for LTAC placement once patient is more clinically stable for LTAC.   Expected Discharge Plan: Long Term Acute Care (LTAC) Barriers to Discharge: Continued Medical Work up  Expected Discharge Plan and Services Expected Discharge Plan: Long Term Acute Care (LTAC)   Discharge Planning Services: CM Consult Post Acute Care Choice: Long Term Acute Care (LTAC) Living arrangements for the past 2 months: Apartment(lives in apartment on second floor - planning to change to 1st floor soon)                    Social Determinants of Health (SDOH) Interventions    Readmission Risk Interventions Readmission Risk Prevention Plan 11/24/2019  Transportation Screening Complete  Medication Review Press photographer) Complete  PCP or Specialist appointment within 3-5 days of discharge Complete  HRI or Home Care Consult Complete  SW Recovery Care/Counseling Consult Complete  Palliative Care Screening Complete  Skilled Nursing Facility Complete  Some recent data might be hidden

## 2019-11-29 NOTE — Progress Notes (Signed)
PT Cancellation Note  Patient Details Name: Patrick [name] MRN: [id number] DOB: 27-Feb-1956   Cancelled Treatment:    Reason Eval/Treat Not Completed: Patient at procedure or test/unavailable; patient down for surgical debridement of sternal wound.  Will attempt another day.   Reginia Naas 11/29/2019, 3:46 PM Magda Kiel, [place] [phone] 11/29/2019

## 2019-11-30 ENCOUNTER — Inpatient Hospital Stay (HOSPITAL_COMMUNITY): Payer: Medicare HMO

## 2019-11-30 LAB — GLUCOSE, CAPILLARY
Glucose-Capillary: 149 mg/dL — ABNORMAL HIGH (ref 70–99)
Glucose-Capillary: 150 mg/dL — ABNORMAL HIGH (ref 70–99)
Glucose-Capillary: 203 mg/dL — ABNORMAL HIGH (ref 70–99)
Glucose-Capillary: 235 mg/dL — ABNORMAL HIGH (ref 70–99)
Glucose-Capillary: 241 mg/dL — ABNORMAL HIGH (ref 70–99)
Glucose-Capillary: 247 mg/dL — ABNORMAL HIGH (ref 70–99)

## 2019-11-30 LAB — COOXEMETRY PANEL
Carboxyhemoglobin: 1.4 % (ref 0.5–1.5)
Methemoglobin: 0.9 % (ref 0.0–1.5)
O2 Saturation: 52.2 %
Total hemoglobin: 11.1 g/dL — ABNORMAL LOW (ref 12.0–16.0)

## 2019-11-30 LAB — CBC
HCT: 24.3 % — ABNORMAL LOW (ref 39.0–52.0)
Hemoglobin: 7.2 g/dL — ABNORMAL LOW (ref 13.0–17.0)
MCH: 28.8 pg (ref 26.0–34.0)
MCHC: 29.6 g/dL — ABNORMAL LOW (ref 30.0–36.0)
MCV: 97.2 fL (ref 80.0–100.0)
Platelets: 297 10*3/uL (ref 150–400)
RBC: 2.5 MIL/uL — ABNORMAL LOW (ref 4.22–5.81)
RDW: 21.1 % — ABNORMAL HIGH (ref 11.5–15.5)
WBC: 18.8 10*3/uL — ABNORMAL HIGH (ref 4.0–10.5)
nRBC: 0.5 % — ABNORMAL HIGH (ref 0.0–0.2)

## 2019-11-30 LAB — BASIC METABOLIC PANEL
Anion gap: 14 (ref 5–15)
BUN: 47 mg/dL — ABNORMAL HIGH (ref 8–23)
CO2: 20 mmol/L — ABNORMAL LOW (ref 22–32)
Calcium: 6.1 mg/dL — CL (ref 8.9–10.3)
Chloride: 97 mmol/L — ABNORMAL LOW (ref 98–111)
Creatinine, Ser: 5.86 mg/dL — ABNORMAL HIGH (ref 0.61–1.24)
GFR calc Af Amer: 11 mL/min — ABNORMAL LOW (ref >60)
GFR calc non Af Amer: 9 mL/min — ABNORMAL LOW (ref >60)
Glucose, Bld: 241 mg/dL — ABNORMAL HIGH (ref 70–99)
Potassium: 4.6 mmol/L (ref 3.5–5.1)
Sodium: 131 mmol/L — ABNORMAL LOW (ref 135–145)

## 2019-11-30 MED ORDER — CHLORHEXIDINE GLUCONATE CLOTH 2 % EX PADS
6.0000 | MEDICATED_PAD | Freq: Every day | CUTANEOUS | Status: DC
  Administered 2019-12-01 – 2019-12-02 (×2): 6 via TOPICAL

## 2019-11-30 MED ORDER — HEPARIN SODIUM (PORCINE) 1000 UNIT/ML IJ SOLN: 3000.0000 [IU] | Freq: Once | INTRAMUSCULAR | Status: DC

## 2019-11-30 NOTE — Progress Notes (Signed)
Physical Therapy Treatment Patient Details Name: Patrick [name] MRN: [id number] DOB: [birth year] Today's Date: 11/30/2019    History of Present Illness Pt is a 64 yo male s/p re-do CABGx3 with Aortic valve replacement. Pt has been on CRRT but removed 4/11 due to hypotension and VTach with return to CRRT 4/12 pt with ileus. 4/23  While patient undergoing gated CT (to further evaluate pericardium) he had a PEA arrest. CPR done for approximately 3 minutes with ROSC and patient intubated.  PMHx: ESRD MWF, HTN, PNA, T2DM, MI.  S/p sternal debridement with placement of wound vac 11/29/19.    PT Comments    Patient limited to in bed therex per RN with new open chest wound and R femoral access.  Feel pt tolerated activity in the bed much better than in the past with rolling for hygiene as well as LE therex.  He still needs reminders for sternal precautions.  Feel he will continue to benefit from skilled PT in the acute setting and from follow up PT in  [facility] setting.   Follow Up Recommendations  LTACH     Equipment Recommendations  None recommended by PT    Recommendations for Other Services       Precautions / Restrictions Precautions Precautions: Fall;Sternal;Other (comment) Precaution Comments: external pacer, sternal wound vac, R femoral CVL    Mobility  Bed Mobility Overal bed mobility: Needs Assistance Bed Mobility: Rolling Rolling: Mod assist         General bed mobility comments: rolling to assist nursing with hygiene, cues for hugging pillow and not using rails  Transfers                 General transfer comment: NT, per RN pt with femoral line and open chest  Ambulation/Gait                 Stairs             Wheelchair Mobility    Modified Rankin (Stroke Patients Only)       Balance                                            Cognition Arousal/Alertness: Awake/alert Behavior During Therapy: WFL for tasks  assessed/performed Overall Cognitive Status: Impaired/Different from baseline Area of Impairment: Memory                     Memory: Decreased short-term memory;Decreased recall of precautions       Problem Solving: Slow processing General Comments: multiple reminders during session about sternal precautions with rolling in bed and performing LE therex in bed      Exercises General Exercises - Lower Extremity Ankle Circles/Pumps: AROM;Both;10 reps;Supine Quad Sets: Strengthening;Both;10 reps;Supine Short Arc Quad: Both;10 reps;Supine;AROM Toe Raises: AROM;10 reps;Supine;Both Other Exercises Other Exercises: L terminal knee extension with bilat hip internal rotation x 10 w/ 5 sec hold    General Comments        Pertinent Vitals/Pain Pain Assessment: Faces Faces Pain Scale: Hurts little more Pain Location: leg pain Pain Descriptors / Indicators: Discomfort;Grimacing Pain Intervention(s): Monitored during session    Home Living                      Prior Function            PT Goals (current goals can  now be found in the care plan section) Progress towards PT goals: Progressing toward goals    Frequency    Min 2X/week      PT Plan Current plan remains appropriate    Co-evaluation              AM-PAC PT "6 Clicks" Mobility   Outcome Measure  Help needed turning from your back to your side while in a flat bed without using bedrails?: A Lot Help needed moving from lying on your back to sitting on the side of a flat bed without using bedrails?: Total Help needed moving to and from a bed to a chair (including a wheelchair)?: Total Help needed standing up from a chair using your arms (e.g., wheelchair or bedside chair)?: Total Help needed to walk in hospital room?: Total Help needed climbing 3-5 steps with a railing? : Total 6 Click Score: 7    End of Session   Activity Tolerance: Patient tolerated treatment well Patient left: in bed    PT Visit Diagnosis: Muscle weakness (generalized) (M62.81);Other abnormalities of gait and mobility (R26.89)     Time: 1535-1600 PT Time Calculation (min) (ACUTE ONLY): 25 min  Charges:  $Therapeutic Exercise: 8-22 mins $Therapeutic Activity: 8-22 mins                     Magda Kiel, [place] [facility] [phone] 11/30/2019    Reginia Naas 11/30/2019, 4:40 PM

## 2019-11-30 NOTE — TOC Transition Note (Addendum)
Transition of Care [facility]) - CM/SW Discharge Note   Patient Details  Name: BIRAN MAYBERRY MRN: [id number] Date of Birth: Dec 13, 1955  Transition of Care [facility]) CM/SW Contact:  Curlene Labrum, RN Phone Number: 11/30/2019, 11:55 AM   Clinical Narrative:    Case Management spoke with Ellwood Handler, PA in reference to patient's date of transfer to [facility].  [place], PA spoke with Dr. Buena Irish and patient planning for transfer to LTAC at [place] on Monday 5/10.  I spoke with the wife and made her aware of transfer plans for Monday as long as patient is medically stable and clear on cardiology standpoint. Called Griselda Miner, RNCM at [facility] and she will start insurance authorization.  5/6- benefits check placed for Eliquis 5 mg po BID indefinitely.     Final next level of care: Long Term Acute Care (LTAC) Barriers to Discharge: Continued Medical Work up   Patient Goals and CMS Choice Patient states their goals for this hospitalization and ongoing recovery are:: Patient's wife receptive to transferring the patient to [facility] when able. CMS Medicare.gov Compare Post Acute Care list provided to:: Patient Represenative (must comment)(Sheila, wife) Choice offered to / list presented to : [facility] POA / [place]  Discharge Placement                       Discharge Plan and Services   Discharge Planning Services: CM Consult Post Acute Care Choice: Long Term Acute Care (LTAC)                               Social Determinants of Health (SDOH) Interventions     Readmission Risk Interventions Readmission Risk Prevention Plan 11/24/2019  Transportation Screening Complete  Medication Review Press photographer) Complete  PCP or Specialist appointment within 3-5 days of discharge Complete  HRI or Home Care Consult Complete  SW Recovery Care/Counseling Consult Complete  Palliative Care Screening Complete  Skilled Nursing Facility Complete  Some recent  data might be hidden

## 2019-11-30 NOTE — Progress Notes (Signed)
[facility] Progress Note  Subjective: pt seen in room, no c/o's today in good spirits  Vitals:   11/30/19 0800 11/30/19 0900 11/30/19 1000 11/30/19 1100  BP: 100/68 100/69 (!) 99/45 (!) 116/38  Pulse: 71 66 73 (!) 57  Resp: (!) 23 (!) 23 (!) 23 19  Temp:      TempSrc:      SpO2: 94% 100% 100% 100%  Weight:      Height:         Physical Exam: Pt not seen  OP HD: [name] MWF   4h 400/800   99.5kg   2K/3.5Ca   TDC   Heparin 3000 -Mircera 75 (last 3/22)  -Hectorol 1     Assessment/ Plan: 1.  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  Off vaso gtt, on levo gtt now. In OR today for I&D of sternal incision with possible wound VAC placement. 2. ESRD: Previously on CRRT due to hemodynamic instability, now back on iHD. Had HD yesterday. Will plan HD tomorrow off sched and then HD again Friday to get back on schedule.  3. 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 5. Nutrition: On dysphagia 3 diet with aspiration precautions based on results of swallow test. 6.  Atrial fibrillation: With controlled ventricular response 7.  BP/ volume: BP's remain low to low-normal, will keep even w/ HD again tomorrow. Wt's are stable.    Rob Nuha Degner 11/30/2019, 1:21 PM   Recent Labs  Lab 11/23/19 1624 11/23/19 1624 11/24/19 0524 11/25/19 0429 11/29/19 0343 11/30/19 0500  K 4.2   < > 4.1   < > 4.0 4.6  BUN 50*   < > 73*   < > 98* 47*  CREATININE 2.61*   < > 3.92*   < > 9.35* 5.86*  CALCIUM 7.1*   < > 6.9*   < > 5.9* 6.1*  PHOS 2.9  --  3.1  --   --   --   HGB  --   --  8.2*   < > 7.6* 7.2*   < > = values in this interval not displayed.   Inpatient medications: . amiodarone  200 mg Oral BID  . apixaban  5 mg Oral BID  .  Chlorhexidine Gluconate Cloth  6 each Topical Daily  . Chlorhexidine Gluconate Cloth  6 each Topical Q0600  . clopidogrel  75 mg Oral Daily  . darbepoetin (ARANESP) injection - DIALYSIS  100 mcg Intravenous Q Sat-HD  . feeding supplement (ENSURE ENLIVE)  237 mL Oral TID BM  . feeding supplement (PRO-STAT SUGAR FREE 64)  60 mL Per Tube BID  . insulin aspart  0-9 Units Subcutaneous Q4H  . insulin glargine  6 Units Subcutaneous Daily  . midodrine  20 mg Oral TID WC  . multivitamin  1 tablet Oral QHS  . nutrition supplement (JUVEN)  1 packet Per Tube BID BM  . pantoprazole  40 mg Oral Daily  . rosuvastatin  10 mg Oral q1800  . sodium chloride flush  10-40 mL Intracatheter Q12H   . sodium chloride Stopped (11/21/19 1749)  . sodium chloride    . sodium chloride    . sodium chloride    . feeding supplement (VITAL 1.5 CAL) Stopped (11/28/19 1518)  . linezolid (ZYVOX) IV 300 mL/hr at 11/30/19 1100  .  norepinephrine (LEVOPHED) Adult infusion 4 mcg/min (11/30/19 1100)  . vasopressin (PITRESSIN) infusion - *FOR SHOCK* Stopped (11/28/19 1302)   sodium chloride, Place/Maintain arterial line **AND** sodium chloride, Place/Maintain arterial line **AND** sodium chloride, acetaminophen (TYLENOL) oral liquid 160 mg/5 mL, dextrose, diphenhydrAMINE, fentaNYL (SUBLIMAZE) injection, Gerhardt's butt cream, levalbuterol, lip balm, metoprolol tartrate, ondansetron (ZOFRAN) IV, oxyCODONE, phenol, sodium chloride flush

## 2019-11-30 NOTE — Consult Note (Signed)
WOC consulted for NPWT VAC dressing changes M/W/F. First post op dressing change for Friday 12/02/19.  Will notify surgeon need for he or designee to be at the bedside for the first dressing change. Supplies ordered to patient's room.   [place], [place], [place]

## 2019-11-30 NOTE — Progress Notes (Signed)
   Began tube feeds Vital 1.5 Cal at 1739.

## 2019-11-30 NOTE — Progress Notes (Addendum)
Patient ID: Patrick [name], male   DOB: Jan 18, 1956, 64 y.o.   MRN: [id number]     Advanced Heart Failure Rounding Note  PCP-Cardiologist: Dorris Carnes, MD   Patient Profile   64 y/o male w/ h/o CAD s/p remote CABG in 1997 w/ LIMA-LAD, failed kidney transplant, end-stage renal disease on dialysis admitted w/ CP. LHC w/ severe CAD. Echo w/ reduced EF 35-40% and moderate AI. S/p redo CABG x 3 on 4/1 (SVG-LAD, SVG-PDA, SVG-OM) + bioprosthetic AVR. Post operative course c/b by cardiogenic shock, respiratory failure, VT, and protein malnutrition.    Subjective:    Patient had respiratory arrest with PEA while in CT scanner 4/23, had 5 minutes CPR with drugs, achieved ROSC and was intubated.  K noted > 7.  He was extubated on 4/27.   CVVH stopped 4/28. Now back on iHD. CVP 4-5.   Remains on NE. Dose increased yesterday, now at 10 mcg + high dose midodrine 20 mg tid.  Co-ox 52%.  RN reports difficulty weaning NE overnight. He does not have an a-line. Had Cuff MAPs below 60.   Enterococcus faecium from wound culture. He is on linezolid. AF.  WBC trending up, 11>>13>>15>>19.  S/p sternal debridement and placement of wound VAC 5/4.   Laying in bed. No complaints. Denies subjective fever or chills.   Objective:   Weight Range: 88.1 kg Body mass index is 30.88 kg/m.   Vital Signs:   Temp:  [97.2 F (36.2 C)-98.8 F (37.1 C)] 98.6 F (37 C) (05/05 0300) Pulse Rate:  [59-106] 67 (05/05 0630) Resp:  [15-36] 17 (05/05 0630) BP: (41-178)/(14-151) 103/53 (05/05 0630) SpO2:  [68 %-100 %] 100 % (05/05 0630) Weight:  [88.1 kg] 88.1 kg (05/04 1100) Last BM Date: 11/29/19  Weight change: Filed Weights   11/28/19 0645 11/29/19 0646 11/29/19 1100  Weight: 86.8 kg 88.1 kg 88.1 kg    Intake/Output:   Intake/Output Summary (Last 24 hours) at 11/30/2019 0711 Last data filed at 11/30/2019 0600 Gross per 24 hour  Intake 1515.91 ml  Output 16 ml  Net 1499.91 ml      Physical Exam    CVP  4-5 General: chronically ill appearing, moderately obese AAM. No respiratory distress  HEENT: Normal, + NGT Neck: No JVD, no thyromegaly or thyroid nodule.  Lungs: CTAB CV: Nondisplaced PMI.  RRR. No murmurs rubs or gallops, wound wac over sternum w/ good seal  Abdomen: Soft, nontender, no hepatosplenomegaly, no distention.  Skin: Intact without lesions or rashes.  Neurologic: Alert and oriented x 3.  Psych: Normal affect. Extremities: No clubbing or cyanosis. No LEE     Telemetry   NSR 80s w/ occasional PVCs   Labs    CBC Recent Labs    11/29/19 0343 11/30/19 0500  WBC 14.7* 18.8*  HGB 7.6* 7.2*  HCT 25.1* 24.3*  MCV 93.7 97.2  PLT 296 299   Basic Metabolic Panel Recent Labs    11/29/19 0343 11/30/19 0500  NA 132* 131*  K 4.0 4.6  CL 101 97*  CO2 18* 20*  GLUCOSE 213* 241*  BUN 98* 47*  CREATININE 9.35* 5.86*  CALCIUM 5.9* 6.1*   Liver Function Tests No results for input(s): AST, ALT, ALKPHOS, BILITOT, PROT, ALBUMIN in the last 72 hours. No results for input(s): LIPASE, AMYLASE in the last 72 hours. Cardiac Enzymes No results for input(s): CKTOTAL, CKMB, CKMBINDEX, TROPONINI in the last 72 hours.  BNP: BNP (last 3 results) Recent Labs  11/20/19 0353 11/21/19 0410  BNP 374.6* 519.5*    ProBNP (last 3 results) No results for input(s): PROBNP in the last 8760 hours.   D-Dimer No results for input(s): DDIMER in the last 72 hours. Hemoglobin A1C No results for input(s): HGBA1C in the last 72 hours. Fasting Lipid Panel No results for input(s): CHOL, HDL, LDLCALC, TRIG, CHOLHDL, LDLDIRECT in the last 72 hours. Thyroid Function Tests No results for input(s): TSH, T4TOTAL, T3FREE, THYROIDAB in the last 72 hours.  Invalid input(s): FREET3  Other results:   Imaging    No results found.   Medications:     Scheduled Medications: . amiodarone  200 mg Oral BID  . apixaban  5 mg Oral BID  . Chlorhexidine Gluconate Cloth  6 each Topical  Daily  . Chlorhexidine Gluconate Cloth  6 each Topical Q0600  . clopidogrel  75 mg Oral Daily  . darbepoetin (ARANESP) injection - DIALYSIS  100 mcg Intravenous Q Sat-HD  . feeding supplement (ENSURE ENLIVE)  237 mL Oral TID BM  . feeding supplement (PRO-STAT SUGAR FREE 64)  60 mL Per Tube BID  . insulin aspart  0-9 Units Subcutaneous Q4H  . insulin glargine  6 Units Subcutaneous Daily  . midodrine  20 mg Oral TID WC  . multivitamin  1 tablet Oral QHS  . nutrition supplement (JUVEN)  1 packet Per Tube BID BM  . pantoprazole  40 mg Oral Daily  . rosuvastatin  10 mg Oral q1800  . sodium chloride flush  10-40 mL Intracatheter Q12H    Infusions: . sodium chloride Stopped (11/21/19 1749)  . sodium chloride    . sodium chloride    . sodium chloride    . feeding supplement (VITAL 1.5 CAL) Stopped (11/28/19 1518)  . linezolid (ZYVOX) IV Stopped (11/29/19 2313)  . norepinephrine (LEVOPHED) Adult infusion 10 mcg/min (11/30/19 0600)  . vasopressin (PITRESSIN) infusion - *FOR SHOCK* Stopped (11/28/19 1302)    PRN Medications: sodium chloride, Place/Maintain arterial line **AND** sodium chloride, Place/Maintain arterial line **AND** sodium chloride, acetaminophen (TYLENOL) oral liquid 160 mg/5 mL, dextrose, diphenhydrAMINE, fentaNYL (SUBLIMAZE) injection, Gerhardt's butt cream, levalbuterol, lip balm, metoprolol tartrate, ondansetron (ZOFRAN) IV, oxyCODONE, phenol, sodium chloride flush   Assessment/Plan   1. Cardiogenic shock: Post-op.  Echo 4/14 with EF 30-35%, stable aortic valve replacement.  Echo on 4/21 with EF 45-50% and significant respirophasic septal variation.  Pericardium in some places appears thickened though windows are poor.  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 CT chest 4/23, difficult images as the patient became unstable during the scan. There did not appear to be significant pericardial pathology. S/p PEA arrest 4/23. CVP 4-5  with co-ox 52%.  He is off vasopressin. On NE 10 (difficulty weaning) + midodrine 20 tid.  - try to wean NE as tolerated, can stay off for SBP > 85   2. ESRD:  CVVHD stopped 4/28. Back on iHD on TTS schedule. Management per nephrology.  - Volume status stable today. CVP 4-5. K 4.6.  3. Possible aspiration PNA: He has completed empiric vanco/meropenem.  4. Atrial fibrillation: Paroxysmal.  NSR today, 80s.  - Continue amiodarone 200 mg bid for now. - Continue apixaban 5 mg bid.  5. Anemia, post-op: Hgb 7.2 today. Monitor closely, transfuses for hgb <7.0  6. CAD: Redo CABG x 3 this admission on 4/1 (SVG-LAD, SVG-PDA, SVG-OM).   - Continue Plavix and Crestor.  7. VT:  Frequent NSVT, now on Amiodarone with improvement. 8. Acute hypoxic  respiratory failure: due to pulmonary edema and aspiration PNA.  Re-intubated after PEA arrest 4/23. Extubated again on 4/27.  Has pleural effusions but did not appear amenable to thoracentesis per CCM.  9. ID: Enterococcus faecium on wound culture. Afebrile, WBCs trending up at 18k with increased NE requirements. May need to repeat blood cultures.  - He is on linezolid - s/p sternal debridement and wound VAC placement 5/4. Management per CT surgery. 10. PEA arrest on 4/23.  Resuscitated with CPR.  Suspect due to hypoxemia.  11. Elevated LFTs: ?Shock liver.  RUQ US unremarkable.  - Trending down. 12. Severe Deconditioning - PT/OT following. Recommending CIR.   13. Ileus: Resolved, getting TFs.  14. Sternal wound: Enterococcus faecium on wound culture. - continue abx therapy w/ linezolid  - s/p debridement and wound vac 5/4.   Length of Stay: [address] PA-C  11/30/2019, 7:11 AM  Advanced Heart Failure Team Pager [phone] (M-F; 7a - 4p)   Patient seen with PA, agree with the above note.   Getting HD today.  He is on NE 10 but off vasopressin.  CVP 4-5.  He is in NSR, no longer pacing.   General: NAD Neck: No JVD, no thyromegaly or thyroid  nodule.  Lungs: Clear to auscultation bilaterally with normal respiratory effort. CV: Nondisplaced PMI.  Heart regular S1/S2, no S3/S4, no murmur.  No peripheral edema.   Abdomen: Soft, nontender, no hepatosplenomegaly, no distention.  Skin: Intact without lesions or rashes.  Neurologic: Alert and oriented x 3.  Psych: Normal affect. Extremities: No clubbing or cyanosis.  HEENT: Normal.   Making slow progress.  Think we can be more lenient with BP and hopefully get him off norepinephrine, aim for SBP >85.   Had iHD yesterday.    Yesterday had chest wound debridement and vac placement.  Continues on linezolid for Enterococcus from wound.   Loralie Champagne 11/30/2019 8:34 AM

## 2019-11-30 NOTE — Progress Notes (Signed)
1 Day Post-Op Procedure(s) (LRB): STERNAL WOUND DEBRIDEMENT (N/A) APPLICATION OF WOUND VAC (N/A) Subjective: No complaints  Objective: Vital signs in last 24 hours: Temp:  [97.2 F (36.2 C)-98.8 F (37.1 C)] 98 F (36.7 C) (05/05 0700) Pulse Rate:  [59-106] 71 (05/05 0800) Cardiac Rhythm: Normal sinus rhythm;Atrial fibrillation (05/05 0400) Resp:  [15-36] 23 (05/05 0800) BP: (41-178)/(14-151) 100/68 (05/05 0800) SpO2:  [68 %-100 %] 94 % (05/05 0800) Weight:  [88.1 kg] 88.1 kg (05/04 1100)  Hemodynamic parameters for last 24 hours: CVP:  [0 mmHg-33 mmHg] 1 mmHg  Intake/Output from previous day: 05/04 0701 - 05/05 0700 In: 1515.9 [I.V.:505.1; IV Piggyback:830.9] Out: 16 [Drains:100; Blood:10] Intake/Output this shift: No intake/output data recorded.  General appearance: alert and cooperative Neurologic: intact Heart: irregularly irregular rhythm Extremities: extremities normal, atraumatic, no cyanosis or edema Wound: wound vac in place  Lab Results: Recent Labs    11/29/19 0343 11/30/19 0500  WBC 14.7* 18.8*  HGB 7.6* 7.2*  HCT 25.1* 24.3*  PLT 296 297   BMET:  Recent Labs    11/29/19 0343 11/30/19 0500  NA 132* 131*  K 4.0 4.6  CL 101 97*  CO2 18* 20*  GLUCOSE 213* 241*  BUN 98* 47*  CREATININE 9.35* 5.86*  CALCIUM 5.9* 6.1*    PT/INR: No results for input(s): LABPROT, INR in the last 72 hours. ABG    Component Value Date/Time   PHART 7.375 11/20/2019 0626   HCO3 23.4 11/20/2019 0626   TCO2 25 11/20/2019 0626   ACIDBASEDEF 2.0 11/20/2019 0626   O2SAT 52.2 11/30/2019 0500   CBG (last 3)  Recent Labs    11/29/19 2351 11/30/19 0457 11/30/19 0750  GLUCAP 194* 235* 241*    Assessment/Plan: S/P Procedure(s) (LRB): STERNAL WOUND DEBRIDEMENT (N/A) APPLICATION OF WOUND VAC (N/A) HD per nephrology  Wound care consult for vac change   LOS: 37 days    Wonda Olds 11/30/2019

## 2019-11-30 NOTE — Progress Notes (Signed)
Inpatient Rehabilitation Admissions Coordinator  Noted plans for [facility] on Monday. I will sign off at this time.  Danne Baxter, RN, MSN Rehab Admissions Coordinator [phone] 11/30/2019 4:06 PM

## 2019-12-01 LAB — GLUCOSE, CAPILLARY
Glucose-Capillary: 159 mg/dL — ABNORMAL HIGH (ref 70–99)
Glucose-Capillary: 95 mg/dL (ref 70–99)
Glucose-Capillary: 127 mg/dL — ABNORMAL HIGH (ref 70–99)
Glucose-Capillary: 136 mg/dL — ABNORMAL HIGH (ref 70–99)
Glucose-Capillary: 167 mg/dL — ABNORMAL HIGH (ref 70–99)
Glucose-Capillary: 189 mg/dL — ABNORMAL HIGH (ref 70–99)

## 2019-12-01 LAB — CBC
HCT: 22.1 % — ABNORMAL LOW (ref 39.0–52.0)
Hemoglobin: 6.6 g/dL — CL (ref 13.0–17.0)
MCH: 28.6 pg (ref 26.0–34.0)
MCHC: 29.9 g/dL — ABNORMAL LOW (ref 30.0–36.0)
MCV: 95.7 fL (ref 80.0–100.0)
Platelets: 233 10*3/uL (ref 150–400)
RBC: 2.31 MIL/uL — ABNORMAL LOW (ref 4.22–5.81)
RDW: 20.9 % — ABNORMAL HIGH (ref 11.5–15.5)
WBC: 15.1 10*3/uL — ABNORMAL HIGH (ref 4.0–10.5)
nRBC: 0.5 % — ABNORMAL HIGH (ref 0.0–0.2)

## 2019-12-01 LAB — ZINC: Zinc: 97 ug/dL (ref 44–115)

## 2019-12-01 LAB — PREPARE RBC (CROSSMATCH)

## 2019-12-01 LAB — COOXEMETRY PANEL
Carboxyhemoglobin: 1.8 % — ABNORMAL HIGH (ref 0.5–1.5)
Methemoglobin: 1 % (ref 0.0–1.5)
O2 Saturation: 58.1 %
Total hemoglobin: 6.6 g/dL — CL (ref 12.0–16.0)

## 2019-12-01 LAB — BASIC METABOLIC PANEL
Anion gap: 14 (ref 5–15)
BUN: 71 mg/dL — ABNORMAL HIGH (ref 8–23)
CO2: 21 mmol/L — ABNORMAL LOW (ref 22–32)
Calcium: 5.8 mg/dL — CL (ref 8.9–10.3)
Chloride: 98 mmol/L (ref 98–111)
Creatinine, Ser: 7.46 mg/dL — ABNORMAL HIGH (ref 0.61–1.24)
GFR calc Af Amer: 8 mL/min — ABNORMAL LOW (ref >60)
GFR calc non Af Amer: 7 mL/min — ABNORMAL LOW (ref >60)
Glucose, Bld: 184 mg/dL — ABNORMAL HIGH (ref 70–99)
Potassium: 4.1 mmol/L (ref 3.5–5.1)
Sodium: 133 mmol/L — ABNORMAL LOW (ref 135–145)

## 2019-12-01 LAB — VITAMIN C: Vitamin C: 0.1 mg/dL — ABNORMAL LOW (ref 0.4–2.0)

## 2019-12-01 MED ORDER — HEPARIN SODIUM (PORCINE) 1000 UNIT/ML IJ SOLN
INTRAMUSCULAR | Status: AC
  Filled 2019-12-01: qty 5

## 2019-12-01 MED ORDER — CHLORHEXIDINE GLUCONATE CLOTH 2 % EX PADS
6.0000 | MEDICATED_PAD | Freq: Every day | CUTANEOUS | Status: DC
  Administered 2019-12-02 – 2019-12-08 (×3): 6 via TOPICAL

## 2019-12-01 MED ORDER — SODIUM CHLORIDE 0.9% IV SOLUTION: Freq: Once | INTRAVENOUS | Status: DC

## 2019-12-01 NOTE — Progress Notes (Signed)
2 Days Post-Op Procedure(s) (LRB): STERNAL WOUND DEBRIDEMENT (N/A) APPLICATION OF WOUND VAC (N/A) Subjective: No complaints  Objective: Vital signs in last 24 hours: Temp:  [97.6 F (36.4 C)-98.3 F (36.8 C)] 98.3 F (36.8 C) (05/06 0718) Pulse Rate:  [57-88] 82 (05/06 0745) Cardiac Rhythm: Normal sinus rhythm (05/06 0400) Resp:  [15-27] 22 (05/06 0718) BP: (71-123)/(32-108) 104/64 (05/06 0745) SpO2:  [90 %-100 %] 92 % (05/06 0718) Weight:  [90.1 kg] 90.1 kg (05/06 0710)  Hemodynamic parameters for last 24 hours: CVP:  [0 mmHg-9 mmHg] 4 mmHg  Intake/Output from previous day: 05/05 0701 - 05/06 0700 In: 886.8 [I.V.:46.8; NG/GT:240; IV Piggyback:600] Out: -  Intake/Output this shift: No intake/output data recorded.  General appearance: slowed mentation Neurologic: intact Heart: regular rate and rhythm, S1, S2 normal, no murmur, click, rub or gallop Wound: dressed with wound vac  Lab Results: Recent Labs    11/30/19 0500 12/01/19 0320  WBC 18.8* 15.1*  HGB 7.2* 6.6*  HCT 24.3* 22.1*  PLT 297 233   BMET:  Recent Labs    11/30/19 0500 12/01/19 0320  NA 131* 133*  K 4.6 4.1  CL 97* 98  CO2 20* 21*  GLUCOSE 241* 184*  BUN 47* 71*  CREATININE 5.86* 7.46*  CALCIUM 6.1* 5.8*    PT/INR: No results for input(s): LABPROT, INR in the last 72 hours. ABG    Component Value Date/Time   PHART 7.375 11/20/2019 0626   HCO3 23.4 11/20/2019 0626   TCO2 25 11/20/2019 0626   ACIDBASEDEF 2.0 11/20/2019 0626   O2SAT 58.1 12/01/2019 0317   CBG (last 3)  Recent Labs    11/30/19 2003 11/30/19 2348 12/01/19 0349  GLUCAP 150* 203* 159*    Assessment/Plan: S/P Procedure(s) (LRB): STERNAL WOUND DEBRIDEMENT (N/A) APPLICATION OF WOUND VAC (N/A) HD per nephrology  Ok for [facility] discharge after wound vac changed tomorrow   LOS: 38 days    Wonda Olds 12/01/2019

## 2019-12-01 NOTE — Progress Notes (Signed)
Occupational Therapy Treatment Patient Details Name: Patrick [name] MRN: [id number] DOB: 1955/11/18 Today's Date: 12/01/2019    History of present illness Pt is a 64 yo male s/p re-do CABGx3 with Aortic valve replacement. Pt has been on CRRT but removed 4/11 due to hypotension and VTach with return to CRRT 4/12 pt with ileus. 4/23  While patient undergoing gated CT (to further evaluate pericardium) he had a PEA arrest. CPR done for approximately 3 minutes with ROSC and patient intubated.  PMHx: ESRD MWF, HTN, PNA, T2DM, MI.  S/p sternal debridement with placement of wound vac 11/29/19.   OT comments  Pt seen for OT follow up with focus on BADL mobility progression. Pt completed rolling in bed at mod A to place lift pad. Maxi sky then used to assist patient to chair in semi reclined position due to femoral cath. RN present for transfer. Pt reports fatigue once up to chair. He then completed bil UE exercise with reinforcement and education on sternal precautions. Will continue to follow, LTACH remains appropriate.    Follow Up Recommendations  LTACH;Supervision/Assistance - 24 hour    Equipment Recommendations  None recommended by OT    Recommendations for Other Services      Precautions / Restrictions Precautions Precautions: Fall;Sternal;Other (comment) Precaution Booklet Issued: No Precaution Comments: external pacer, sternal wound vac, R femoral CVL. Reviewed sternal precautions verbally Required Braces or Orthoses: Other Brace Other Brace: B orthotic shoes Restrictions Weight Bearing Restrictions: Yes(sternal) RUE Weight Bearing: Partial weight bearing RUE Partial Weight Bearing Percentage or Pounds: Sternal Precautions LUE Weight Bearing: Partial weight bearing Other Position/Activity Restrictions: L femoral cath, no flex past 90 degrees        Mobility Bed Mobility Overal bed mobility: Needs Assistance Bed Mobility: Rolling Rolling: Mod assist         General bed  mobility comments: modA for rolling to place lift pad  Transfers Overall transfer level: Needs assistance Equipment used: Ambulation equipment used             General transfer comment: [place] utilized for transfer to chair in semi reclined position to reduce flexion in L hip     Balance                                           ADL either performed or assessed with clinical judgement   ADL Overall ADL's : Needs assistance/impaired     Grooming: Wash/dry face;Wash/dry hands;Oral care;Set up;Supervision/safety;Sitting                       Toileting- Clothing Manipulation and Hygiene: Total assistance Toileting - Clothing Manipulation Details (indicate cue type and reason): pt on bed pan with total assist for clean up       General ADL Comments: lift used to place pt in chair with appropriate parameters of femoral line with RN approval. Session focused on mobility progression and BUE ther ex in preparation for BADL     Vision Baseline Vision/History: No visual deficits     Perception     Praxis      Cognition Arousal/Alertness: Awake/alert Behavior During Therapy: WFL for tasks assessed/performed Overall Cognitive Status: Impaired/Different from baseline Area of Impairment: Memory                     Memory: Decreased short-term memory;Decreased recall of  precautions       Problem Solving: Slow processing General Comments: able to partially remember sternal precautions; overall slow to process        Exercises General Exercises - Lower Extremity Ankle Circles/Pumps: AROM;Both;10 reps;Supine Hip ABduction/ADduction: AROM;Both;10 reps;Seated Other Exercises Other Exercises: Bil UE cieling punches x5   Shoulder Instructions       General Comments VSS on RA    Pertinent Vitals/ Pain       Pain Assessment: Faces Faces Pain Scale: Hurts even more Pain Location: leg pain Pain Descriptors / Indicators:  Discomfort;Grimacing Pain Intervention(s): Limited activity within patient's tolerance;Monitored during session;Repositioned  Home Living                                          Prior Functioning/Environment              Frequency  Min 2X/week        Progress Toward Goals  OT Goals(current goals can now be found in the care plan section)  Progress towards OT goals: Progressing toward goals  Acute Rehab OT Goals Patient Stated Goal: get up to chair today OT Goal Formulation: With patient Time For Goal Achievement: 12/15/19 Potential to Achieve Goals: Good  Plan Discharge plan remains appropriate    Co-evaluation    PT/OT/SLP Co-Evaluation/Treatment: Yes Reason for Co-Treatment: Complexity of the patient's impairments (multi-system involvement);For patient/therapist safety PT goals addressed during session: Mobility/safety with mobility OT goals addressed during session: ADL's and self-care;Strengthening/ROM      AM-PAC OT "6 Clicks" Daily Activity     Outcome Measure   Help from another person eating meals?: A Little Help from another person taking care of personal grooming?: A Little Help from another person toileting, which includes using toliet, bedpan, or urinal?: Total Help from another person bathing (including washing, rinsing, drying)?: A Lot Help from another person to put on and taking off regular upper body clothing?: A Lot Help from another person to put on and taking off regular lower body clothing?: Total 6 Click Score: 12    End of Session    OT Visit Diagnosis: Unsteadiness on feet (R26.81);Other abnormalities of gait and mobility (R26.89);Muscle weakness (generalized) (M62.81);Pain;Other symptoms and signs involving cognitive function Pain - Right/Left: (bil) Pain - part of body: Leg   Activity Tolerance Patient tolerated treatment well   Patient Left in chair;with call bell/phone within reach   Nurse Communication  Mobility status        Time: 2426-8341 OT Time Calculation (min): 27 min  Charges: OT General Charges $OT Visit: 1 Visit OT Treatments $Self Care/Home Management : 8-22 mins  Zenovia Jarred, MSOT, OTR/L [facility] Office Number: [phone] Pager: [place] 12/01/2019, 5:18 PM

## 2019-12-01 NOTE — Progress Notes (Signed)
[facility] Progress Note  Subjective: pt seen in room, no c/o's today in good spirits  Vitals:   12/01/19 1030 12/01/19 1045 12/01/19 1050 12/01/19 1125  BP: 115/90 109/66 (!) 106/59   Pulse: 87 89 91   Resp:   20   Temp:   98.5 F (36.9 C) 97.8 F (36.6 C)  TempSrc:   Oral Oral  SpO2:      Weight:   90.2 kg   Height:         Physical Exam: Pt not seen  OP HD: [name] MWF   4h 400/800   99.5kg   2K/3.5Ca   TDC   Heparin 3000 -Mircera 75 (last 3/22)  -Hectorol 1     Assessment/ Plan: 1.  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  Off vaso gtt, on levo gtt now. In OR today for I&D of sternal incision with possible wound VAC placement. 2. ESRD: Previously on CRRT due to hemodynamic instability, now back on iHD. Had HD yesterday. Plan is HD today and then Friday to get back no MWF schedule.  3. BP/ volume: BP's up a little, CVP 4-5 , still 8-9kg under prior dry wt and 5 kg under admit wt.  Cont no UF w/ HD tomorrow 4.  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 6. Nutrition: On dysphagia 3 diet with aspiration precautions based on results of swallow test. 7.  Atrial fibrillation: With controlled ventricular response    Rob Turrell Severt 12/01/2019, 1:03 PM   Recent Labs  Lab 11/30/19 0500 12/01/19 0320  K 4.6 4.1  BUN 47* 71*  CREATININE 5.86* 7.46*  CALCIUM 6.1* 5.8*  HGB 7.2* 6.6*   Inpatient medications: . sodium chloride   Intravenous Once  . amiodarone  200 mg Oral BID  . apixaban  5 mg Oral BID  . Chlorhexidine Gluconate Cloth  6 each Topical Daily  . Chlorhexidine Gluconate Cloth  6 each Topical Q0600  . Chlorhexidine Gluconate Cloth  6 each Topical Q0600  . clopidogrel  75 mg Oral Daily  .  darbepoetin (ARANESP) injection - DIALYSIS  100 mcg Intravenous Q Sat-HD  . feeding supplement (ENSURE ENLIVE)  237 mL Oral TID BM  . feeding supplement (PRO-STAT SUGAR FREE 64)  60 mL Per Tube BID  . heparin sodium (porcine)      . insulin aspart  0-9 Units Subcutaneous Q4H  . insulin glargine  6 Units Subcutaneous Daily  . midodrine  20 mg Oral TID WC  . multivitamin  1 tablet Oral QHS  . nutrition supplement (JUVEN)  1 packet Per Tube BID BM  . pantoprazole  40 mg Oral Daily  . rosuvastatin  10 mg Oral q1800  . sodium chloride flush  10-40 mL Intracatheter Q12H   . sodium chloride Stopped (11/21/19 1749)  . sodium chloride    . sodium chloride    . sodium chloride    . feeding supplement (VITAL 1.5 CAL) 1,000 mL (11/30/19 1732)  . linezolid (ZYVOX) IV 600 mg (12/01/19 1051)  . norepinephrine (LEVOPHED) Adult infusion Stopped (11/30/19 1629)  . vasopressin (PITRESSIN) infusion - *FOR SHOCK* Stopped (11/28/19 1302)   sodium chloride, Place/Maintain arterial line **AND** sodium chloride, Place/Maintain arterial line **AND** sodium chloride, acetaminophen (TYLENOL) oral liquid 160 mg/5 mL, dextrose, diphenhydrAMINE, fentaNYL (SUBLIMAZE) injection, Gerhardt's  butt cream, levalbuterol, lip balm, metoprolol tartrate, ondansetron (ZOFRAN) IV, oxyCODONE, phenol, sodium chloride flush

## 2019-12-01 NOTE — Progress Notes (Signed)
CRITICAL VALUE ALERT  Critical Value:  Hgb- 6.6 & Calcium- 5.8  Date & Time Notied:  12/01/19 0542  Provider Notified: Dr. Orvan Seen  Orders Received/Actions taken: 1 Unit PRBC ordered. MD aware of calcium level. Pt is supposed to get calcium bath with dialysis today.

## 2019-12-01 NOTE — Progress Notes (Signed)
Patient ID: HARRIET BOLLEN, male   DOB: 05-30-1956, 64 y.o.   MRN: [id number]     Advanced Heart Failure Rounding Note  PCP-Cardiologist: Dorris Carnes, MD   Patient Profile   64 y/o male w/ h/o CAD s/p remote CABG in 1997 w/ LIMA-LAD, failed kidney transplant, end-stage renal disease on dialysis admitted w/ CP. LHC w/ severe CAD. Echo w/ reduced EF 35-40% and moderate AI. S/p redo CABG x 3 on 4/1 (SVG-LAD, SVG-PDA, SVG-OM) + bioprosthetic AVR. Post operative course c/b by cardiogenic shock, respiratory failure, VT, and protein malnutrition.    Subjective:    Patient had respiratory arrest with PEA while in CT scanner 4/23, had 5 minutes CPR with drugs, achieved ROSC and was intubated.  K noted > 7.  He was extubated on 4/27.   CVVH stopped 4/28. Now back on iHD, getting this morning. CVP 4-5.   He is now off norepinephrine.  Remains on high dose midodrine 20 mg tid.    Enterococcus faecium from wound culture. He is on linezolid. AF.  WBC 15..  S/p sternal debridement and placement of wound VAC 5/4.   Hgb 6.6, no overt bleeding.   Laying in bed. No complaints. Denies subjective fever or chills.   Objective:   Weight Range: 90.1 kg Body mass index is 31.58 kg/m.   Vital Signs:   Temp:  [97 F (36.1 C)-98.3 F (36.8 C)] 97 F (36.1 C) (05/06 0807) Pulse Rate:  [57-88] 84 (05/06 0845) Resp:  [15-27] 22 (05/06 0718) BP: (71-123)/(32-108) 104/62 (05/06 0845) SpO2:  [90 %-100 %] 92 % (05/06 0718) Weight:  [90.1 kg] 90.1 kg (05/06 0710) Last BM Date: 11/30/19  Weight change: Filed Weights   11/29/19 0646 11/29/19 1100 12/01/19 0710  Weight: 88.1 kg 88.1 kg 90.1 kg    Intake/Output:   Intake/Output Summary (Last 24 hours) at 12/01/2019 0853 Last data filed at 12/01/2019 0600 Gross per 24 hour  Intake 877.42 ml  Output --  Net 877.42 ml      Physical Exam    CVP 4-5 General: NAD Neck: No JVD, no thyromegaly or thyroid nodule.  Lungs: Clear to auscultation bilaterally  with normal respiratory effort. CV: Nondisplaced PMI.  Heart regular S1/S2, no S3/S4, no murmur.  No peripheral edema.   Abdomen: Soft, nontender, no hepatosplenomegaly, no distention.  Skin: Intact without lesions or rashes. Wound vac sternum.  Neurologic: Alert and oriented x 3.  Psych: Normal affect. Extremities: No clubbing or cyanosis.  HEENT: Normal.    Telemetry   NSR 80s (personally reviewed)  Labs    CBC Recent Labs    11/30/19 0500 12/01/19 0320  WBC 18.8* 15.1*  HGB 7.2* 6.6*  HCT 24.3* 22.1*  MCV 97.2 95.7  PLT 297 353   Basic Metabolic Panel Recent Labs    11/30/19 0500 12/01/19 0320  NA 131* 133*  K 4.6 4.1  CL 97* 98  CO2 20* 21*  GLUCOSE 241* 184*  BUN 47* 71*  CREATININE 5.86* 7.46*  CALCIUM 6.1* 5.8*   Liver Function Tests No results for input(s): AST, ALT, ALKPHOS, BILITOT, PROT, ALBUMIN in the last 72 hours. No results for input(s): LIPASE, AMYLASE in the last 72 hours. Cardiac Enzymes No results for input(s): CKTOTAL, CKMB, CKMBINDEX, TROPONINI in the last 72 hours.  BNP: BNP (last 3 results) Recent Labs    11/20/19 0353 11/21/19 0410  BNP 374.6* 519.5*    ProBNP (last 3 results) No results for input(s): PROBNP in the  last 8760 hours.   D-Dimer No results for input(s): DDIMER in the last 72 hours. Hemoglobin A1C No results for input(s): HGBA1C in the last 72 hours. Fasting Lipid Panel No results for input(s): CHOL, HDL, LDLCALC, TRIG, CHOLHDL, LDLDIRECT in the last 72 hours. Thyroid Function Tests No results for input(s): TSH, T4TOTAL, T3FREE, THYROIDAB in the last 72 hours.  Invalid input(s): FREET3  Other results:   Imaging    No results found.   Medications:     Scheduled Medications: . sodium chloride   Intravenous Once  . amiodarone  200 mg Oral BID  . apixaban  5 mg Oral BID  . Chlorhexidine Gluconate Cloth  6 each Topical Daily  . Chlorhexidine Gluconate Cloth  6 each Topical Q0600  . Chlorhexidine  Gluconate Cloth  6 each Topical Q0600  . clopidogrel  75 mg Oral Daily  . darbepoetin (ARANESP) injection - DIALYSIS  100 mcg Intravenous Q Sat-HD  . feeding supplement (ENSURE ENLIVE)  237 mL Oral TID BM  . feeding supplement (PRO-STAT SUGAR FREE 64)  60 mL Per Tube BID  . heparin sodium (porcine)      . insulin aspart  0-9 Units Subcutaneous Q4H  . insulin glargine  6 Units Subcutaneous Daily  . midodrine  20 mg Oral TID WC  . multivitamin  1 tablet Oral QHS  . nutrition supplement (JUVEN)  1 packet Per Tube BID BM  . pantoprazole  40 mg Oral Daily  . rosuvastatin  10 mg Oral q1800  . sodium chloride flush  10-40 mL Intracatheter Q12H    Infusions: . sodium chloride Stopped (11/21/19 1749)  . sodium chloride    . sodium chloride    . sodium chloride    . feeding supplement (VITAL 1.5 CAL) 1,000 mL (11/30/19 1732)  . linezolid (ZYVOX) IV Stopped (11/30/19 2315)  . norepinephrine (LEVOPHED) Adult infusion Stopped (11/30/19 1629)  . vasopressin (PITRESSIN) infusion - *FOR SHOCK* Stopped (11/28/19 1302)    PRN Medications: sodium chloride, Place/Maintain arterial line **AND** sodium chloride, Place/Maintain arterial line **AND** sodium chloride, acetaminophen (TYLENOL) oral liquid 160 mg/5 mL, dextrose, diphenhydrAMINE, fentaNYL (SUBLIMAZE) injection, Gerhardt's butt cream, levalbuterol, lip balm, metoprolol tartrate, ondansetron (ZOFRAN) IV, oxyCODONE, phenol, sodium chloride flush   Assessment/Plan   1. Cardiogenic shock: Post-op.  Echo 4/14 with EF 30-35%, stable aortic valve replacement.  Echo on 4/21 with EF 45-50% and significant respirophasic septal variation.  Pericardium in some places appears thickened though windows are poor.  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 CT chest 4/23, difficult images as the patient became unstable during the scan. There did not appear to be significant pericardial pathology. S/p PEA arrest 4/23.  CVP 4-5.  He is off vasopressin and NE now.  - Continue midodrine 20 tid.  2. ESRD:  CVVHD stopped 4/28. Back on iHD, getting HD today.  3. Possible aspiration PNA: He has completed empiric vanco/meropenem.  4. Atrial fibrillation: Paroxysmal.  NSR today, 80s.  - Decrease amiodarone to 200 mg daily. - Continue apixaban 5 mg bid.  5. Anemia, post-op: Hgb 6.6 today, no overt bleeding.  Transfuse with HD.   6. CAD: Redo CABG x 3 this admission on 4/1 (SVG-LAD, SVG-PDA, SVG-OM).   - Continue Plavix and Crestor.  7. VT:  Frequent NSVT, now on Amiodarone with improvement. 8. Acute hypoxic respiratory failure: due to pulmonary edema and aspiration PNA.  Re-intubated after PEA arrest 4/23. Extubated again on 4/27.  Has pleural effusions but did not  appear amenable to thoracentesis per CCM.  9. ID: Enterococcus faecium on wound culture. Afebrile, WBCs 15. - He is on linezolid - s/p sternal debridement and wound VAC placement 5/4. Plan for wound vac change tomorrow.  10. PEA arrest on 4/23.  Resuscitated with CPR.  Suspect due to hypoxemia.  11. Elevated LFTs: ?Shock liver.  RUQ US unremarkable.  - Trended down.  12. Severe Deconditioning - PT/OT following. Will need LTAC.   13. Ileus: Resolved, getting TFs.  14. Sternal wound: Enterococcus faecium on wound culture. - continue abx therapy w/ linezolid  - s/p debridement and wound vac 5/4, will need wound vac change tomorrow.   He will be ready soon for LTAC.   Length of Stay: [place], MD  12/01/2019, 8:53 AM  Advanced Heart Failure Team Pager [phone] (M-F; 7a - 4p)

## 2019-12-01 NOTE — Progress Notes (Signed)
Physical Therapy Treatment Patient Details Name: Patrick [name] MRN: [id number] DOB: 1956-07-09 Today's Date: 12/01/2019    History of Present Illness Pt is a 64 yo male s/p re-do CABGx3 with Aortic valve replacement. Pt has been on CRRT but removed 4/11 due to hypotension and VTach with return to CRRT 4/12 pt with ileus. 4/23  While patient undergoing gated CT (to further evaluate pericardium) he had a PEA arrest. CPR done for approximately 3 minutes with ROSC and patient intubated.  PMHx: ESRD MWF, HTN, PNA, T2DM, MI.  S/p sternal debridement with placement of wound vac 11/29/19.    PT Comments    Assisted OT and RN with lift to chair with use of Maxisky. Pt with fatigue once in recliner but able to participate in limited therapeutic exercise in chair. Pt grateful to be out of the bed. D/c plans remain appropriate at this time. PT will continue to follow acutely.     Follow Up Recommendations  LTACH     Equipment Recommendations  None recommended by PT       Precautions / Restrictions Precautions Precautions: Fall;Sternal;Other (comment) Precaution Comments: external pacer, sternal wound vac, R femoral CVL Restrictions Weight Bearing Restrictions: Yes(sternal) RUE Weight Bearing: Partial weight bearing RUE Partial Weight Bearing Percentage or Pounds: Sternal Precautions LUE Weight Bearing: Partial weight bearing Other Position/Activity Restrictions: L femoral cath, no flex past 90 degrees     Mobility  Bed Mobility Overal bed mobility: Needs Assistance Bed Mobility: Rolling Rolling: Mod assist         General bed mobility comments: modA for rolling to place lift pad  Transfers Overall transfer level: Needs assistance               General transfer comment: Maxisky utilized for transfer to chair in semi reclined position to reduce flexion in L hip            Cognition Arousal/Alertness: Awake/alert Behavior During Therapy: WFL for tasks  assessed/performed Overall Cognitive Status: Impaired/Different from baseline Area of Impairment: Memory                     Memory: Decreased short-term memory;Decreased recall of precautions       Problem Solving: Slow processing General Comments: able to remember sternal precautions      Exercises General Exercises - Lower Extremity Ankle Circles/Pumps: AROM;Both;10 reps;Supine Hip ABduction/ADduction: AROM;Both;10 reps;Seated    General Comments General comments (skin integrity, edema, etc.): VSS on RA      Pertinent Vitals/Pain Pain Assessment: Faces Faces Pain Scale: Hurts even more Pain Location: leg pain Pain Descriptors / Indicators: Discomfort;Grimacing Pain Intervention(s): Limited activity within patient's tolerance;Monitored during session;Repositioned           PT Goals (current goals can now be found in the care plan section) Acute Rehab PT Goals PT Goal Formulation: With patient/family Time For Goal Achievement: 12/05/19 Potential to Achieve Goals: Fair Progress towards PT goals: Progressing toward goals    Frequency    Min 2X/week      PT Plan Current plan remains appropriate    Co-evaluation PT/OT/SLP Co-Evaluation/Treatment: Yes Reason for Co-Treatment: Complexity of the patient's impairments (multi-system involvement) PT goals addressed during session: Mobility/safety with mobility        AM-PAC PT "6 Clicks" Mobility   Outcome Measure  Help needed turning from your back to your side while in a flat bed without using bedrails?: A Lot Help needed moving from lying on your back to sitting  on the side of a flat bed without using bedrails?: Total Help needed moving to and from a bed to a chair (including a wheelchair)?: Total Help needed standing up from a chair using your arms (e.g., wheelchair or bedside chair)?: Total Help needed to walk in hospital room?: Total Help needed climbing 3-5 steps with a railing? : Total 6 Click  Score: 7    End of Session   Activity Tolerance: Patient tolerated treatment well Patient left: in chair;with call bell/phone within reach Nurse Communication: Mobility status;Need for lift equipment PT Visit Diagnosis: Muscle weakness (generalized) (M62.81);Other abnormalities of gait and mobility (R26.89)     Time: 6283-6629 PT Time Calculation (min) (ACUTE ONLY): 27 min  Charges:  $Therapeutic Exercise: 8-22 mins                     Mayan Kloepfer B. Migdalia Dk PT, DPT Acute Rehabilitation Services Pager [phone] Office [phone]    [place] 12/01/2019, 5:04 PM

## 2019-12-01 NOTE — TOC Benefit Eligibility Note (Signed)
Transition of Care (TOC) Benefit Eligibility Note    Patient Details  Name: Patrick [name] MRN: [id number] Date of Birth: 04/12/1956   Medication/Dose: ELIQUIS  5 MG BID  Covered?: Yes  Tier: (NO TIER)  Prescription Coverage Preferred Pharmacy: [facility]  Spoke with Person/Company/Phone Number:: CHOLE   @  [employer] #  [phone]  Co-Pay: ZERO DOLLARS  Prior Approval: No  Deductible: Met(LOWE INCOME SUBSIDY)  Additional Notes: SECONDARY INS: MEDICAID [place] ACCESS  : EFF-DATE 03-29-2019 CO-PAY- $ 4.00 FOR EACH PRESCRIPTION    [name], Patrick Phone Number: 12/01/2019, 3:44 PM     

## 2019-12-02 LAB — CBC
HCT: 26.1 % — ABNORMAL LOW (ref 39.0–52.0)
Hemoglobin: 8 g/dL — ABNORMAL LOW (ref 13.0–17.0)
MCH: 28 pg (ref 26.0–34.0)
MCHC: 30.7 g/dL (ref 30.0–36.0)
MCV: 91.3 fL (ref 80.0–100.0)
Platelets: 215 10*3/uL (ref 150–400)
RBC: 2.86 MIL/uL — ABNORMAL LOW (ref 4.22–5.81)
RDW: 21.6 % — ABNORMAL HIGH (ref 11.5–15.5)
WBC: 13.6 10*3/uL — ABNORMAL HIGH (ref 4.0–10.5)
nRBC: 1.3 % — ABNORMAL HIGH (ref 0.0–0.2)

## 2019-12-02 LAB — COOXEMETRY PANEL
Carboxyhemoglobin: 1.8 % — ABNORMAL HIGH (ref 0.5–1.5)
Methemoglobin: 0.8 % (ref 0.0–1.5)
O2 Saturation: 69 %
Total hemoglobin: 13 g/dL (ref 12.0–16.0)

## 2019-12-02 LAB — BASIC METABOLIC PANEL
Anion gap: 9 (ref 5–15)
BUN: 47 mg/dL — ABNORMAL HIGH (ref 8–23)
CO2: 24 mmol/L (ref 22–32)
Calcium: 5.8 mg/dL — CL (ref 8.9–10.3)
Chloride: 99 mmol/L (ref 98–111)
Creatinine, Ser: 4.63 mg/dL — ABNORMAL HIGH (ref 0.61–1.24)
GFR calc Af Amer: 14 mL/min — ABNORMAL LOW (ref >60)
GFR calc non Af Amer: 12 mL/min — ABNORMAL LOW (ref >60)
Glucose, Bld: 137 mg/dL — ABNORMAL HIGH (ref 70–99)
Potassium: 3.7 mmol/L (ref 3.5–5.1)
Sodium: 132 mmol/L — ABNORMAL LOW (ref 135–145)

## 2019-12-02 LAB — GLUCOSE, CAPILLARY
Glucose-Capillary: 113 mg/dL — ABNORMAL HIGH (ref 70–99)
Glucose-Capillary: 104 mg/dL — ABNORMAL HIGH (ref 70–99)
Glucose-Capillary: 100 mg/dL — ABNORMAL HIGH (ref 70–99)
Glucose-Capillary: 105 mg/dL — ABNORMAL HIGH (ref 70–99)
Glucose-Capillary: 136 mg/dL — ABNORMAL HIGH (ref 70–99)

## 2019-12-02 LAB — AEROBIC/ANAEROBIC CULTURE W GRAM STAIN (SURGICAL/DEEP WOUND)
Gram Stain: NONE SEEN
Special Requests: NORMAL

## 2019-12-02 MED ORDER — POTASSIUM CHLORIDE 20 MEQ/15ML (10%) PO SOLN
20.0000 meq | ORAL | Status: AC
  Administered 2019-12-02 (×3): 20 meq
  Filled 2019-12-02 (×3): qty 15

## 2019-12-02 MED ORDER — DARBEPOETIN ALFA 100 MCG/0.5ML IJ SOSY
100.0000 ug | PREFILLED_SYRINGE | INTRAMUSCULAR | Status: DC
  Administered 2019-12-02: 100 ug via INTRAVENOUS
  Filled 2019-12-02: qty 0.5

## 2019-12-02 MED ORDER — VITAL 1.5 CAL PO LIQD
1000.0000 mL | ORAL | Status: DC
  Administered 2019-12-02 – 2019-12-07 (×2): 1000 mL
  Filled 2019-12-02 (×5): qty 1000

## 2019-12-02 MED ORDER — HEPARIN SODIUM (PORCINE) 1000 UNIT/ML IJ SOLN
INTRAMUSCULAR | Status: AC
  Administered 2019-12-02: 11:00:00 5000 [IU]
  Filled 2019-12-02: qty 5

## 2019-12-02 MED ORDER — ASCORBIC ACID 500 MG PO TABS
500.0000 mg | ORAL_TABLET | Freq: Two times a day (BID) | ORAL | Status: DC
  Administered 2019-12-02 – 2019-12-06 (×10): 500 mg via ORAL
  Filled 2019-12-02 (×11): qty 1

## 2019-12-02 MED ORDER — PRO-STAT SUGAR FREE PO LIQD
30.0000 mL | Freq: Four times a day (QID) | ORAL | Status: DC
  Administered 2019-12-02 – 2019-12-13 (×43): 30 mL
  Filled 2019-12-02 (×42): qty 30

## 2019-12-02 NOTE — Progress Notes (Signed)
Sternal wound vac dressing changed this AM by [place] RN Val Riles and TCTS PA Jadene Pierini. A few hours afterward, wound vac began alarming "Blockage Alert". Paged Jadene Pierini who said blockage is likely d/t clot formation in tubing or foam dressing d/t bloody drainage from wound that was visualized during dressing change. Instructed to page Dr. Orvan Seen who said to refer to [facility] RN recommendation. WOC RN recommended changing suction canister. Wound vac canister changed but blockage alarm not resolved. Spoke w/ WOC RN and PA Jadene Pierini who recommended removing wound vac dressing and replacing w/ saline-soaked gauze dressing. WOC RN to put in wound care orders. Will take down wound vac dressing and place gauze dressing per WOC instructions.  [employer] RN

## 2019-12-02 NOTE — Progress Notes (Signed)
EVENING ROUNDS NOTE :     [address]       ,[place] [address]             [phone]                 3 Days Post-Op Procedure(s) (LRB): STERNAL WOUND DEBRIDEMENT (N/A) APPLICATION OF WOUND VAC (N/A)  Total Length of Stay:  LOS: 39 days  BP (!) 157/145   Pulse 98   Temp 98.4 F (36.9 C) (Oral)   Resp (!) 28   Ht 5' 6.5" (1.689 m)   Wt 90.2 kg   SpO2 100%   BMI 31.62 kg/m   .Intake/Output      05/07 0701 - 05/08 0700   P.O.    I.V. (mL/kg) 0 (0)   NG/GT 288.5   IV Piggyback 300   Total Intake(mL/kg) 588.5 (6.5)   Drains 250   Other 0   Stool 0   Total Output 250   Net +338.5       Stool Occurrence 5 x     . sodium chloride Stopped (11/21/19 1749)  . sodium chloride    . sodium chloride    . sodium chloride    . feeding supplement (VITAL 1.5 CAL) 1,000 mL (12/02/19 1543)  . linezolid (ZYVOX) IV Stopped (12/02/19 1244)  . norepinephrine (LEVOPHED) Adult infusion Stopped (11/30/19 1629)  . vasopressin (PITRESSIN) infusion - *FOR SHOCK* Stopped (11/28/19 1302)     Lab Results  Component Value Date   WBC 13.6 (H) 12/02/2019   HGB 8.0 (L) 12/02/2019   HCT 26.1 (L) 12/02/2019   PLT 215 12/02/2019   GLUCOSE 137 (H) 12/02/2019   CHOL 122 02/15/2019   TRIG 85 02/15/2019   HDL 46 02/15/2019   LDLCALC 59 02/15/2019   ALT 81 (H) 11/23/2019   AST 44 (H) 11/23/2019   NA 132 (L) 12/02/2019   K 3.7 12/02/2019   CL 99 12/02/2019   CREATININE 4.63 (H) 12/02/2019   BUN 47 (H) 12/02/2019   CO2 24 12/02/2019   TSH 5.113 (H) 05/25/2016   INR 2.6 (H) 11/18/2019   HGBA1C 6.2 (H) 10/27/2019   Stable day Wound vac changed this am    Grace Isaac MD  Beeper [phone] Office [phone] 12/02/2019 8:08 PM

## 2019-12-02 NOTE — Progress Notes (Addendum)
      [address]       [place] [address]             [phone]     VAC change with WOC nurse   Good granulation tissue throughout bed of wound, moderate blood clot with some cont oozing. Some sternal plating system visualized. No purulence or cellulitis. VAC replaced. Patient tolerated well    John Giovanni, PA-C

## 2019-12-02 NOTE — Consult Note (Signed)
WOC Nurse Consult Note: Patient receiving care in [facility] 2H4.  PA, Jadene Pierini, at bedside for the first sternal VAC dressing change. Reason for Consult: Sternal VAC dressing changes Wound type: surgial Pressure Injury POA: Yes/No/NA Measurement: deferred Wound bed: pink, clean; portion of sternal stabilization device and wires visible at the superior aspect of the wound. Drainage (amount, consistency, odor) slight bleeding from patient's left along the wound wall. Periwound: intact Dressing procedure/placement/frequency: 2 pieces of black foam removed, one piece of black foam placed. Immediate seal obtained. Patient tolerated the procedure without receiving pain medication. Val Riles, RN, MSN, CWOCN, CNS-BC, pager [phone]

## 2019-12-02 NOTE — Progress Notes (Addendum)
[facility] Progress Note  Subjective: pt seen in room, no c/o's   Vitals:   12/02/19 0800 12/02/19 0815 12/02/19 0830 12/02/19 0845  BP: 123/78 (!) 105/51 98/86 95/79   Pulse: 87 87 89 92  Resp: (!) 32 (!) 22 (!) 23 20  Temp:      TempSrc:      SpO2:      Weight:      Height:         Physical Exam: Pt not seen  OP HD: [name] MWF   4h 400/800   99.5kg   2K/3.5Ca   TDC   Heparin 3000 -Mircera 75 (last 3/22)  -Hectorol 1     Assessment/ Plan: 1.  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 SP I&D earlier this week sternal wound infection. Shock resolved, off CRRT, letting volume come up some.  2. ESRD: usual HD is MWF. Postop required CRRT due to hemodynamic instability, now back on iHD. HD today on schedule.  3. BP/ volume: BP's 90's- 100's, better. CVP 4-5 , still 8-9kg under prior dry wt. Doing well w/o fluid removal last few sessions, BP's up and looks better 4.  Anemia ckd - Hb 8.0 today. Getting ESA darbe 100ug q Sat 5. CKD-MBD: Phosphorus level improved with supplementation; calcium level acceptable. 6. Nutrition: On dysphagia 3 diet with aspiration precautions based on results of swallow test. 7.  Atrial fibrillation: With controlled ventricular response 8.   Dispo - plans in place for dc to [facility] next Monday after HD here.     Patrick [name] 12/02/2019, 10:02 AM   Recent Labs  Lab 12/01/19 0320 12/02/19 0405  K 4.1 3.7  BUN 71* 47*  CREATININE 7.46* 4.63*  CALCIUM 5.8* 5.8*  HGB 6.6* 8.0*   Inpatient medications: . sodium chloride   Intravenous Once  . amiodarone  200 mg Oral BID  . apixaban  5 mg Oral BID  . Chlorhexidine Gluconate Cloth  6 each Topical Daily  . Chlorhexidine Gluconate Cloth  6 each Topical Q0600  . clopidogrel  75 mg Oral Daily  . darbepoetin (ARANESP) injection - DIALYSIS  100 mcg Intravenous Q Fri-HD  .  feeding supplement (ENSURE ENLIVE)  237 mL Oral TID BM  . feeding supplement (PRO-STAT SUGAR FREE 64)  60 mL Per Tube BID  . heparin sodium (porcine)      . insulin aspart  0-9 Units Subcutaneous Q4H  . insulin glargine  6 Units Subcutaneous Daily  . midodrine  20 mg Oral TID WC  . multivitamin  1 tablet Oral QHS  . nutrition supplement (JUVEN)  1 packet Per Tube BID BM  . pantoprazole  40 mg Oral Daily  . potassium chloride  20 mEq Per Tube Q4H  . rosuvastatin  10 mg Oral q1800  . sodium chloride flush  10-40 mL Intracatheter Q12H   . sodium chloride Stopped (11/21/19 1749)  . sodium chloride    . sodium chloride    . sodium chloride    . feeding supplement (VITAL 1.5 CAL) 1,000 mL (11/30/19 1732)  . linezolid (ZYVOX) IV Stopped (12/01/19 2251)  . norepinephrine (LEVOPHED) Adult infusion Stopped (11/30/19 1629)  . vasopressin (PITRESSIN) infusion - *FOR SHOCK* Stopped (11/28/19 1302)   sodium chloride, Place/Maintain arterial line **AND** sodium chloride, Place/Maintain arterial line **AND** sodium chloride, acetaminophen (TYLENOL) oral liquid 160 mg/5 mL, dextrose, diphenhydrAMINE, fentaNYL (SUBLIMAZE) injection, Gerhardt's butt cream, levalbuterol,  lip balm, metoprolol tartrate, ondansetron (ZOFRAN) IV, oxyCODONE, phenol, sodium chloride flush

## 2019-12-02 NOTE — Progress Notes (Signed)
Nutrition Follow-up  DOCUMENTATION CODES:   Not applicable  INTERVENTION:   Pt po intake remains poor (po 5-20% of meals) with reports of poor appetite and felling of "fullness"/early satiety which is a chronic problem for patient. Recommend continuing Cortrak with supplemental TF at this time  Continue Ensure Enlive po TID, each supplement provides 350 kcal and 20 grams of protein  Continue Magic cup BID with meals, each supplement provides 290 kcal and 9 grams of protein  Supplemental Tube Feeding via Cortrak:  Vital 1.5 at 30 ml/hr Pro-Stat 30 mL QID (previously 60 mL BID, spreading out administration given high osmolality and concern contributing to diarrhea) Provides 1480 kcals, 109 g of protein and 547 mL of free water Meets 60% calorie needs, 80% protein needs  Add Vitamin C 500 mg BID x 30 days for deficiency (Needs to be continued at discharge)  Continue Rena-Vit daily at bedtime  D/C JUVEN given pt experiencing diarrhea after adminstration   NUTRITION DIAGNOSIS:   Inadequate oral intake related to acute illness as evidenced by NPO status.  Being addressed via TF, supplements  GOAL:   Patient will meet greater than or equal to 90% of their needs  Progressing  MONITOR:   PO intake, Diet advancement, Supplement acceptance, Labs, Weight trends, Skin  REASON FOR ASSESSMENT:   Ventilator    ASSESSMENT:   64 yo male admitted with NSTEMI and found to have mutlivessel CAD and underwent CABG on 4/1, ESRD/HD requiring CRRT. PMH includes ESRD on HD s/p renal transplant, CAD s/p CABG, CHF, DM  3/29 Admission 3/30 Cardiac Cath 4/01 CABG with IABP placement(removed 4/3), Intubated 4/02 CRRT initiated 4/04 Extubated 4/05 Cortrak placed,tip in stomach;okay to feed gastric per RD discussion with Dr. Orvan Seen 4/07 CRRT discontinued, ileus, NPO, Cortrak pulled out by pt 4/08 CL diet in AM, FL diet in PM 4/09 iHD with 1.3 L removed 4/10 CRRT re-initiated 4/11 Diet  advanced to Dysphagia III 4/13 Diet advanced to Heart Healthy, Carb Modified 4/14 Cortrak placed 4/15 TF off due to abdominal pain 4/23 PEA arrest while supine for CT chest, hyperkalemia with K+>7.5, Intubated 4/27 Extubated 4/28 CRRT discontinued, TF held for ileus 5/01 HD 5/04 OR for sternal debridement and application of wound vac; diet advanced to Dysphagia 3 5/06 iHD 5/07 iHD  Noted plan for discharge to [facility] on Monday  iHD yesterday and today, pt receives iHD MWF and now back on regular schedule. Net UF today was 0 mL  Pt continues to complain of early satiety, "feeling full." Pt not eating well yesterday. Pt ate 5% at breakfast, 5% at lunch and 20% at dinner. Pt receiving Ensure Enlive and likes but unsure how much he is drinking  Tolerating Vital 1.5 at 20 ml/hr via Cortrak. Cortrak is in post-pyloric position and should affect feeling of "fullness."  Pt has been having diarrhea soon after Juven administration. Plan to d/c today  Vitamin Panel 11/28/2019: Vitamin C: 0.1 (L) Zinc: 97 (wdl)  Wound Vac with 75 mL in 24 hours; no UOP, Diarrhea x 4 yesterday  Labs: calcium 5.8 (L)-no albumin available for correction Meds: Rena-Vit, ss novolog, lantus, KCl   Diet Order:   Diet Order            DIET DYS 3 Room service appropriate? Yes; Fluid consistency: Thin  Diet effective now              EDUCATION NEEDS:   No education needs have been identified at this time  Skin:  Skin Assessment: Skin Integrity Issues: Skin Integrity Issues:: Incisions DTI: R toe Stage II: sacrum Unstageable: R toe, L toe Incisions: sternal incision wound with purulent drainage with debridement  Last BM:  5/7  Height:   Ht Readings from Last 1 Encounters:  11/22/19 5' 6.5" (1.689 m)    Weight:   Wt Readings from Last 1 Encounters:  12/02/19 90.2 kg    BMI:  Body mass index is 31.62 kg/m.  Estimated Nutritional Needs:   Kcal:  2300-2700  Protein:  120-140 gm  Fluid:   1000 plus UOP   Kerman Passey MS, RDN, LDN, CNSC RD Pager Number and RD On-Call Pager Number Located in [place]

## 2019-12-02 NOTE — Progress Notes (Signed)
Dr. Orvan Seen changed wound vac dressing at bedside. Wound vac working properly at this time, will continue to monitor.  [employer] RN

## 2019-12-02 NOTE — Progress Notes (Signed)
Patient ID: Patrick [name], male   DOB: 05/14/1956, 64 y.o.   MRN: [id number]  He remains off pressors.  Stable today, LTAC soon. Will follow from a distance, call with questions.   Loralie Champagne 12/02/2019

## 2019-12-03 LAB — TYPE AND SCREEN
ABO/RH(D): O POS
Antibody Screen: POSITIVE
DAT, IgG: POSITIVE
Unit division: 0
Unit division: 0

## 2019-12-03 LAB — CBC
HCT: 25.1 % — ABNORMAL LOW (ref 39.0–52.0)
Hemoglobin: 7.6 g/dL — ABNORMAL LOW (ref 13.0–17.0)
MCH: 28.6 pg (ref 26.0–34.0)
MCHC: 30.3 g/dL (ref 30.0–36.0)
MCV: 94.4 fL (ref 80.0–100.0)
Platelets: 190 10*3/uL (ref 150–400)
RBC: 2.66 MIL/uL — ABNORMAL LOW (ref 4.22–5.81)
RDW: 21.5 % — ABNORMAL HIGH (ref 11.5–15.5)
WBC: 14.8 10*3/uL — ABNORMAL HIGH (ref 4.0–10.5)
nRBC: 0.5 % — ABNORMAL HIGH (ref 0.0–0.2)

## 2019-12-03 LAB — COOXEMETRY PANEL
Carboxyhemoglobin: 2 % — ABNORMAL HIGH (ref 0.5–1.5)
Methemoglobin: 1 % (ref 0.0–1.5)
O2 Saturation: 66.1 %
Total hemoglobin: 13.9 g/dL (ref 12.0–16.0)

## 2019-12-03 LAB — BPAM RBC
Blood Product Expiration Date: 202105172359
Blood Product Expiration Date: 202105182359
ISSUE DATE / TIME: 202104121038
ISSUE DATE / TIME: 202105060653
Unit Type and Rh: 5100
Unit Type and Rh: 5100

## 2019-12-03 LAB — GLUCOSE, CAPILLARY
Glucose-Capillary: 101 mg/dL — ABNORMAL HIGH (ref 70–99)
Glucose-Capillary: 126 mg/dL — ABNORMAL HIGH (ref 70–99)
Glucose-Capillary: 133 mg/dL — ABNORMAL HIGH (ref 70–99)
Glucose-Capillary: 137 mg/dL — ABNORMAL HIGH (ref 70–99)
Glucose-Capillary: 138 mg/dL — ABNORMAL HIGH (ref 70–99)
Glucose-Capillary: 149 mg/dL — ABNORMAL HIGH (ref 70–99)
Glucose-Capillary: 159 mg/dL — ABNORMAL HIGH (ref 70–99)
Glucose-Capillary: 206 mg/dL — ABNORMAL HIGH (ref 70–99)

## 2019-12-03 LAB — RENAL FUNCTION PANEL
Albumin: 1.4 g/dL — ABNORMAL LOW (ref 3.5–5.0)
Anion gap: 8 (ref 5–15)
BUN: 44 mg/dL — ABNORMAL HIGH (ref 8–23)
CO2: 26 mmol/L (ref 22–32)
Calcium: 6.2 mg/dL — CL (ref 8.9–10.3)
Chloride: 100 mmol/L (ref 98–111)
Creatinine, Ser: 4.3 mg/dL — ABNORMAL HIGH (ref 0.61–1.24)
GFR calc Af Amer: 16 mL/min — ABNORMAL LOW (ref >60)
GFR calc non Af Amer: 13 mL/min — ABNORMAL LOW (ref >60)
Glucose, Bld: 151 mg/dL — ABNORMAL HIGH (ref 70–99)
Phosphorus: 3 mg/dL (ref 2.5–4.6)
Potassium: 4.4 mmol/L (ref 3.5–5.1)
Sodium: 134 mmol/L — ABNORMAL LOW (ref 135–145)

## 2019-12-03 MED ORDER — SODIUM CHLORIDE 0.9 % IV SOLN
2.0000 g | Freq: Once | INTRAVENOUS | Status: AC
  Administered 2019-12-03: 13:00:00 2 g via INTRAVENOUS
  Filled 2019-12-03: qty 20

## 2019-12-03 NOTE — Progress Notes (Addendum)
[facility] NEPHROLOGY PROGRESS NOTE  Assessment/ Plan: Pt is a 64 y.o. yo male HD ESRD on HD status post CABG and aortic valve replacement, now in ICU.  #Status post CABG and aortic valve replacement complicated by cardiogenic shock required CRRT.  Shock improved.  Now on intermittent HD.  # ESRD: Status post HD yesterday.  No net UF because of hypotension.  No plan for HD today.  He may need CRRT for ultrafiltration.  Monitor daily for dialysis need.  I have discussed with ICU nurse.  # Anemia of CKD and acute blood loss: Continue ESA.  Monitor hemoglobin.  # Secondary hyperparathyroidism: No BMP available from today.  Check renal panel to monitor calcium and phosphorus level.  #Hypotension: Blood pressure soft.  Off pressor however on midodrine.  Monitor volume status.  Addendum: He has persistent hypocalcemia, calcium came back 6.2.  The corrected calcium around 7.9.  Order IV calcium. D/w ICU nurse  Subjective: Seen and examined in ICU.  Able to lie flat.  Using oxygen.  Denies nausea vomiting.  No new event.  Had HD yesterday. Objective Vital signs in last 24 hours: Vitals:   12/03/19 0745 12/03/19 0759 12/03/19 0800 12/03/19 0815  BP: (!) 75/51  (!) 111/55 (!) 91/52  Pulse: 92  92 88  Resp: (!) 26  (!) 23 (!) 24  Temp:  98.7 F (37.1 C)    TempSrc:  Oral    SpO2: 100%  100% 97%  Weight:      Height:       Weight change: 0.1 kg  Intake/Output Summary (Last 24 hours) at 12/03/2019 0851 Last data filed at 12/03/2019 0700 Gross per 24 hour  Intake 1208.5 ml  Output 300 ml  Net 908.5 ml       Labs: Basic Metabolic Panel: Recent Labs  Lab 11/30/19 0500 12/01/19 0320 12/02/19 0405  NA 131* 133* 132*  K 4.6 4.1 3.7  CL 97* 98 99  CO2 20* 21* 24  GLUCOSE 241* 184* 137*  BUN 47* 71* 47*  CREATININE 5.86* 7.46* 4.63*  CALCIUM 6.1* 5.8* 5.8*   Liver Function Tests: No results for input(s): AST, ALT, ALKPHOS, BILITOT, PROT, ALBUMIN in the last 168  hours. No results for input(s): LIPASE, AMYLASE in the last 168 hours. No results for input(s): AMMONIA in the last 168 hours. CBC: Recent Labs  Lab 11/29/19 0343 11/29/19 0343 11/30/19 0500 11/30/19 0500 12/01/19 0320 12/02/19 0405 12/03/19 0525  WBC 14.7*   < > 18.8*   < > 15.1* 13.6* 14.8*  HGB 7.6*   < > 7.2*   < > 6.6* 8.0* 7.6*  HCT 25.1*   < > 24.3*   < > 22.1* 26.1* 25.1*  MCV 93.7  --  97.2  --  95.7 91.3 94.4  PLT 296   < > 297   < > 233 215 190   < > = values in this interval not displayed.   Cardiac Enzymes: No results for input(s): CKTOTAL, CKMB, CKMBINDEX, TROPONINI in the last 168 hours. CBG: Recent Labs  Lab 12/02/19 2019 12/03/19 0035 12/03/19 0352 12/03/19 0357 12/03/19 0800  GLUCAP 136* 149* 206* 126* 137*    Iron Studies: No results for input(s): IRON, TIBC, TRANSFERRIN, FERRITIN in the last 72 hours. Studies/Results: No results found.  Medications: Infusions: . sodium chloride Stopped (11/21/19 1749)  . sodium chloride    . sodium chloride    . sodium chloride    . feeding supplement (VITAL  1.5 CAL) 1,000 mL (12/02/19 1543)  . linezolid (ZYVOX) IV Stopped (12/02/19 2238)  . norepinephrine (LEVOPHED) Adult infusion Stopped (11/30/19 1629)  . vasopressin (PITRESSIN) infusion - *FOR SHOCK* Stopped (11/28/19 1302)    Scheduled Medications: . sodium chloride   Intravenous Once  . amiodarone  200 mg Oral BID  . apixaban  5 mg Oral BID  . vitamin C  500 mg Oral BID  . Chlorhexidine Gluconate Cloth  6 each Topical Daily  . Chlorhexidine Gluconate Cloth  6 each Topical Q0600  . clopidogrel  75 mg Oral Daily  . darbepoetin (ARANESP) injection - DIALYSIS  100 mcg Intravenous Q Fri-HD  . feeding supplement (ENSURE ENLIVE)  237 mL Oral TID BM  . feeding supplement (PRO-STAT SUGAR FREE 64)  30 mL Per Tube QID  . insulin aspart  0-9 Units Subcutaneous Q4H  . insulin glargine  6 Units Subcutaneous Daily  . midodrine  20 mg Oral TID WC  .  multivitamin  1 tablet Oral QHS  . pantoprazole  40 mg Oral Daily  . rosuvastatin  10 mg Oral q1800  . sodium chloride flush  10-40 mL Intracatheter Q12H    have reviewed scheduled and prn medications.  Physical Exam: General:NAD, comfortable flat Heart:RRR, s1s2 nl, surgical wound in the chest Lungs: Distant breath sound Abdomen:soft, Non-tender, non-distended Extremities:No edema Dialysis Access: Left IJ TDC.  Jyair Kiraly Prasad Priscilla Kirstein 12/03/2019,8:51 AM  LOS: 40 days  Pager: [phone]

## 2019-12-03 NOTE — Progress Notes (Signed)
Patient ID: Patrick [name], male   DOB: [birth year], 64 y.o.   MRN: [id number] [facility] DAILY ICU PROGRESS NOTE                   [address]            [place] [address]          [phone]   4 Days Post-Op Procedure(s) (LRB): STERNAL WOUND DEBRIDEMENT (N/A) APPLICATION OF WOUND VAC (N/A)  Date of Procedure:    10/28/19 Preoperative Diagnosis:      Severe 3-vessel Coronary Artery Disease Postoperative Diagnosis:    Same with moderate aortic valve insufficiency Procedure:      Aortic valve replacement (23 mm Edwards Intuity bovine bioprosthetic) Coronary Artery Bypass Grafting x 3             Reverse saphenous vein graft to Distal Left Anterior Descending Coronary Artery; reverse saphenous Vein Graft to Posterior Descending Coronary Artery and distal obtuse Marginal Branch of Left Circumflex Coronary Artery; Endoscopic Vein Harvest from right thigh and Lower Leg Surgeon:        B.  Murvin Natal, MD Total Length of Stay:  LOS: 40 days   Subjective: Patient awake and talkative this morning.  He has been sitting in the chair some.  Says his legs are too weak to hold them up  Objective: Vital signs in last 24 hours: Temp:  [97.8 F (36.6 C)-99.2 F (37.3 C)] 98.1 F (36.7 C) (05/08 0400) Pulse Rate:  [47-104] 92 (05/08 0730) Cardiac Rhythm: Normal sinus rhythm (05/08 0400) Resp:  [16-44] 24 (05/08 0730) BP: (49-157)/(24-145) 99/77 (05/08 0730) SpO2:  [74 %-100 %] 100 % (05/08 0730) Weight:  [90.2 kg] 90.2 kg (05/07 1045)  Filed Weights   12/01/19 1050 12/02/19 0650 12/02/19 1045  Weight: 90.2 kg 90.2 kg 90.2 kg    Weight change: 0.1 kg   Hemodynamic parameters for last 24 hours:    Intake/Output from previous day: 05/07 0701 - 05/08 0700 In: 1248.5 [NG/GT:648.5; IV Piggyback:600] Out: 300 [Drains:300]  Intake/Output this shift: No intake/output data recorded.  Current Meds: Scheduled Meds: . sodium chloride   Intravenous Once  . amiodarone  200 mg  Oral BID  . apixaban  5 mg Oral BID  . vitamin C  500 mg Oral BID  . Chlorhexidine Gluconate Cloth  6 each Topical Daily  . Chlorhexidine Gluconate Cloth  6 each Topical Q0600  . clopidogrel  75 mg Oral Daily  . darbepoetin (ARANESP) injection - DIALYSIS  100 mcg Intravenous Q Fri-HD  . feeding supplement (ENSURE ENLIVE)  237 mL Oral TID BM  . feeding supplement (PRO-STAT SUGAR FREE 64)  30 mL Per Tube QID  . insulin aspart  0-9 Units Subcutaneous Q4H  . insulin glargine  6 Units Subcutaneous Daily  . midodrine  20 mg Oral TID WC  . multivitamin  1 tablet Oral QHS  . pantoprazole  40 mg Oral Daily  . rosuvastatin  10 mg Oral q1800  . sodium chloride flush  10-40 mL Intracatheter Q12H   Continuous Infusions: . sodium chloride Stopped (11/21/19 1749)  . sodium chloride    . sodium chloride    . sodium chloride    . feeding supplement (VITAL 1.5 CAL) 1,000 mL (12/02/19 1543)  . linezolid (ZYVOX) IV Stopped (12/02/19 2238)  . norepinephrine (LEVOPHED) Adult infusion Stopped (11/30/19 1629)  . vasopressin (PITRESSIN) infusion - *FOR SHOCK* Stopped (11/28/19 1302)   PRN Meds:.sodium  chloride, Place/Maintain arterial line **AND** sodium chloride, Place/Maintain arterial line **AND** sodium chloride, acetaminophen (TYLENOL) oral liquid 160 mg/5 mL, dextrose, diphenhydrAMINE, fentaNYL (SUBLIMAZE) injection, Jame Morrell's butt cream, levalbuterol, lip balm, metoprolol tartrate, ondansetron (ZOFRAN) IV, oxyCODONE, phenol, sodium chloride flush  General appearance: alert and cooperative Neurologic: intact Heart: regular rate and rhythm, S1, S2 normal, no murmur, click, rub or gallop Lungs: diminished breath sounds bibasilar Abdomen: soft, non-tender; bowel sounds normal; no masses,  no organomegaly Extremities: extremities normal, atraumatic, no cyanosis or edema Wound: Wound VAC in place and functioning  Lab Results: CBC: Recent Labs    12/02/19 0405 12/03/19 0525  WBC 13.6* 14.8*  HGB  8.0* 7.6*  HCT 26.1* 25.1*  PLT 215 190   BMET:  Recent Labs    12/01/19 0320 12/02/19 0405  NA 133* 132*  K 4.1 3.7  CL 98 99  CO2 21* 24  GLUCOSE 184* 137*  BUN 71* 47*  CREATININE 7.46* 4.63*  CALCIUM 5.8* 5.8*    CMET: Lab Results  Component Value Date   WBC 14.8 (H) 12/03/2019   HGB 7.6 (L) 12/03/2019   HCT 25.1 (L) 12/03/2019   PLT 190 12/03/2019   GLUCOSE 137 (H) 12/02/2019   CHOL 122 02/15/2019   TRIG 85 02/15/2019   HDL 46 02/15/2019   LDLCALC 59 02/15/2019   ALT 81 (H) 11/23/2019   AST 44 (H) 11/23/2019   NA 132 (L) 12/02/2019   K 3.7 12/02/2019   CL 99 12/02/2019   CREATININE 4.63 (H) 12/02/2019   BUN 47 (H) 12/02/2019   CO2 24 12/02/2019   TSH 5.113 (H) 05/25/2016   INR 2.6 (H) 11/18/2019   HGBA1C 6.2 (H) 10/27/2019      PT/INR: No results for input(s): LABPROT, INR in the last 72 hours. Radiology: No results found.  Recent Results (from the past 240 hour(s))  Culture, blood (routine x 2)     Status: None   Collection Time: 11/23/19 11:57 AM   Specimen: BLOOD RIGHT HAND  Result Value Ref Range Status   Specimen Description BLOOD RIGHT HAND  Final   Special Requests   Final    BOTTLES DRAWN AEROBIC ONLY Blood Culture results may not be optimal due to an inadequate volume of blood received in culture bottles   Culture   Final    NO GROWTH 5 DAYS Performed at [facility], [place] [address] [place], [place] [address]    Report Status 11/28/2019 FINAL  Final  Culture, blood (routine x 2)     Status: None   Collection Time: 11/23/19 12:00 PM   Specimen: BLOOD LEFT HAND  Result Value Ref Range Status   Specimen Description BLOOD LEFT HAND  Final   Special Requests   Final    BOTTLES DRAWN AEROBIC ONLY Blood Culture results may not be optimal due to an inadequate volume of blood received in culture bottles   Culture   Final    NO GROWTH 5 DAYS Performed at [facility], [place] [address], [place] , [place] [address]    Report Status  11/28/2019 FINAL  Final  Aerobic/Anaerobic Culture (surgical/deep wound)     Status: None   Collection Time: 11/25/19  8:30 AM   Specimen: Wound  Result Value Ref Range Status   Specimen Description WOUND CHEST  Final   Special Requests Normal  Final   Gram Stain   Final    NO WBC SEEN FEW GRAM POSITIVE COCCI RARE GRAM POSITIVE RODS  Culture   Final    FEW VANCOMYCIN RESISTANT ENTEROCOCCUS ISOLATED NO ANAEROBES ISOLATED Performed at [facility], [place] [address] [place], [place] [address]    Report Status 12/02/2019 FINAL  Final   Organism ID, Bacteria VANCOMYCIN RESISTANT ENTEROCOCCUS ISOLATED  Final      Susceptibility   Vancomycin resistant enterococcus isolated - MIC*    AMPICILLIN >=32 RESISTANT Resistant     VANCOMYCIN >=32 RESISTANT Resistant     GENTAMICIN SYNERGY SENSITIVE Sensitive     LINEZOLID 2 SENSITIVE Sensitive     * FEW VANCOMYCIN RESISTANT ENTEROCOCCUS ISOLATED    Assessment/Plan: S/P Procedure(s) (LRB): STERNAL WOUND DEBRIDEMENT (N/A) APPLICATION OF WOUND VAC (N/A) Mobilize Waiting for LTAC placement  VANCOMYCIN RESISTANT ENTEROCOCCUS ISOLATED- 4/30 from wound now open    Grace Isaac 12/03/2019 8:03 AM

## 2019-12-03 NOTE — Progress Notes (Signed)
EVENING ROUNDS NOTE :     [address]       ,[place] [address]             [phone]                 4 Days Post-Op Procedure(s) (LRB): STERNAL WOUND DEBRIDEMENT (N/A) APPLICATION OF WOUND VAC (N/A)  Total Length of Stay:  LOS: 40 days  BP (!) 142/73   Pulse 95   Temp 98.6 F (37 C) (Oral)   Resp (!) 33   Ht 5' 6.5" (1.689 m)   Wt 90.2 kg   SpO2 93%   BMI 31.62 kg/m   .Intake/Output      05/08 0701 - 05/09 0700   I.V. (mL/kg)    NG/GT 210   IV Piggyback 420   Total Intake(mL/kg) 630 (7)   Drains    Other    Stool    Total Output    Net +630       Stool Occurrence 1 x     . sodium chloride Stopped (11/21/19 1749)  . sodium chloride    . sodium chloride    . sodium chloride    . feeding supplement (VITAL 1.5 CAL) 1,000 mL (12/02/19 1543)  . linezolid (ZYVOX) IV Stopped (12/03/19 1145)  . norepinephrine (LEVOPHED) Adult infusion Stopped (11/30/19 1629)  . vasopressin (PITRESSIN) infusion - *FOR SHOCK* Stopped (11/28/19 1302)     Lab Results  Component Value Date   WBC 14.8 (H) 12/03/2019   HGB 7.6 (L) 12/03/2019   HCT 25.1 (L) 12/03/2019   PLT 190 12/03/2019   GLUCOSE 151 (H) 12/03/2019   CHOL 122 02/15/2019   TRIG 85 02/15/2019   HDL 46 02/15/2019   LDLCALC 59 02/15/2019   ALT 81 (H) 11/23/2019   AST 44 (H) 11/23/2019   NA 134 (L) 12/03/2019   K 4.4 12/03/2019   CL 100 12/03/2019   CREATININE 4.30 (H) 12/03/2019   BUN 44 (H) 12/03/2019   CO2 26 12/03/2019   TSH 5.113 (H) 05/25/2016   INR 2.6 (H) 11/18/2019   HGBA1C 6.2 (H) 10/27/2019    Stable day Not able to stand, did sit in chair   Grace Isaac MD  Beeper [phone] Office [phone] 12/03/2019 7:18 PM

## 2019-12-04 ENCOUNTER — Inpatient Hospital Stay (HOSPITAL_COMMUNITY): Payer: Medicare HMO

## 2019-12-04 LAB — CBC
HCT: 25.2 % — ABNORMAL LOW (ref 39.0–52.0)
Hemoglobin: 7.5 g/dL — ABNORMAL LOW (ref 13.0–17.0)
MCH: 28.2 pg (ref 26.0–34.0)
MCHC: 29.8 g/dL — ABNORMAL LOW (ref 30.0–36.0)
MCV: 94.7 fL (ref 80.0–100.0)
Platelets: 154 10*3/uL (ref 150–400)
RBC: 2.66 MIL/uL — ABNORMAL LOW (ref 4.22–5.81)
RDW: 21.2 % — ABNORMAL HIGH (ref 11.5–15.5)
WBC: 13.8 10*3/uL — ABNORMAL HIGH (ref 4.0–10.5)
nRBC: 0.4 % — ABNORMAL HIGH (ref 0.0–0.2)

## 2019-12-04 LAB — GLUCOSE, CAPILLARY
Glucose-Capillary: 125 mg/dL — ABNORMAL HIGH (ref 70–99)
Glucose-Capillary: 128 mg/dL — ABNORMAL HIGH (ref 70–99)
Glucose-Capillary: 164 mg/dL — ABNORMAL HIGH (ref 70–99)
Glucose-Capillary: 109 mg/dL — ABNORMAL HIGH (ref 70–99)

## 2019-12-04 LAB — BASIC METABOLIC PANEL
Anion gap: 12 (ref 5–15)
BUN: 60 mg/dL — ABNORMAL HIGH (ref 8–23)
CO2: 23 mmol/L (ref 22–32)
Calcium: 7.1 mg/dL — ABNORMAL LOW (ref 8.9–10.3)
Chloride: 98 mmol/L (ref 98–111)
Creatinine, Ser: 5.35 mg/dL — ABNORMAL HIGH (ref 0.61–1.24)
GFR calc Af Amer: 12 mL/min — ABNORMAL LOW (ref >60)
GFR calc non Af Amer: 10 mL/min — ABNORMAL LOW (ref >60)
Glucose, Bld: 150 mg/dL — ABNORMAL HIGH (ref 70–99)
Potassium: 5 mmol/L (ref 3.5–5.1)
Sodium: 133 mmol/L — ABNORMAL LOW (ref 135–145)

## 2019-12-04 LAB — COOXEMETRY PANEL
Carboxyhemoglobin: 1.8 % — ABNORMAL HIGH (ref 0.5–1.5)
Methemoglobin: 0.8 % (ref 0.0–1.5)
O2 Saturation: 61.5 %
Total hemoglobin: 6.7 g/dL — CL (ref 12.0–16.0)

## 2019-12-04 NOTE — Progress Notes (Signed)
Patient ID: Patrick [name], male   DOB: [birth year], 64 y.o.   MRN: [id number] Patient ID: Patrick [name], male   DOB: 02-24-1956, 64 y.o.   MRN: [id number] [facility] DAILY ICU PROGRESS NOTE                   [address]            [place] [address]          [phone]   5 Days Post-Op Procedure(s) (LRB): STERNAL WOUND DEBRIDEMENT (N/A) APPLICATION OF WOUND VAC (N/A)  Date of Procedure:    10/28/19 Preoperative Diagnosis:      Severe 3-vessel Coronary Artery Disease Postoperative Diagnosis:    Same with moderate aortic valve insufficiency Procedure:      Aortic valve replacement (23 mm Edwards Intuity bovine bioprosthetic) Coronary Artery Bypass Grafting x 3             Reverse saphenous vein graft to Distal Left Anterior Descending Coronary Artery; reverse saphenous Vein Graft to Posterior Descending Coronary Artery and distal obtuse Marginal Branch of Left Circumflex Coronary Artery; Endoscopic Vein Harvest from right thigh and Lower Leg Surgeon:        B.  Murvin Natal, MD Total Length of Stay:  LOS: 41 days   Subjective: Patient awake , more talkative this morning.  He has been sitting in the chair, but unable to stand  Tube feedings going, also on a chopped diet but not able to take much p.o.  Objective: Vital signs in last 24 hours: Temp:  [97.2 F (36.2 C)-98.7 F (37.1 C)] 98.5 F (36.9 C) (05/09 0822) Pulse Rate:  [86-222] 97 (05/09 0700) Cardiac Rhythm: Atrial fibrillation (05/09 0400) Resp:  [16-33] 24 (05/09 0700) BP: (79-142)/(51-99) 105/61 (05/09 0700) SpO2:  [93 %-100 %] 98 % (05/09 0700)  Filed Weights   12/01/19 1050 12/02/19 0650 12/02/19 1045  Weight: 90.2 kg 90.2 kg 90.2 kg    Weight change:    Hemodynamic parameters for last 24 hours:    Intake/Output from previous day: 05/08 0701 - 05/09 0700 In: 1015 [I.V.:10; NG/GT:285; IV Piggyback:720] Out: 2 [Stool:2]  Intake/Output this shift: No intake/output data recorded.  Current  Meds: Scheduled Meds: . sodium chloride   Intravenous Once  . amiodarone  200 mg Oral BID  . apixaban  5 mg Oral BID  . vitamin C  500 mg Oral BID  . Chlorhexidine Gluconate Cloth  6 each Topical Daily  . Chlorhexidine Gluconate Cloth  6 each Topical Q0600  . clopidogrel  75 mg Oral Daily  . darbepoetin (ARANESP) injection - DIALYSIS  100 mcg Intravenous Q Fri-HD  . feeding supplement (ENSURE ENLIVE)  237 mL Oral TID BM  . feeding supplement (PRO-STAT SUGAR FREE 64)  30 mL Per Tube QID  . insulin aspart  0-9 Units Subcutaneous Q4H  . insulin glargine  6 Units Subcutaneous Daily  . midodrine  20 mg Oral TID WC  . multivitamin  1 tablet Oral QHS  . pantoprazole  40 mg Oral Daily  . rosuvastatin  10 mg Oral q1800  . sodium chloride flush  10-40 mL Intracatheter Q12H   Continuous Infusions: . sodium chloride Stopped (11/21/19 1749)  . sodium chloride    . sodium chloride    . sodium chloride    . feeding supplement (VITAL 1.5 CAL) 1,000 mL (12/02/19 1543)  . linezolid (ZYVOX) IV Stopped (12/03/19 2227)  . norepinephrine (LEVOPHED) Adult infusion Stopped (11/30/19  1629)  . vasopressin (PITRESSIN) infusion - *FOR SHOCK* Stopped (11/28/19 1302)   PRN Meds:.sodium chloride, Place/Maintain arterial line **AND** sodium chloride, Place/Maintain arterial line **AND** sodium chloride, acetaminophen (TYLENOL) oral liquid 160 mg/5 mL, dextrose, diphenhydrAMINE, fentaNYL (SUBLIMAZE) injection, Bradi Arbuthnot's butt cream, levalbuterol, lip balm, metoprolol tartrate, ondansetron (ZOFRAN) IV, oxyCODONE, phenol, sodium chloride flush  General appearance: alert and cooperative Neurologic: intact Heart: regular rate and rhythm, S1, S2 normal, no murmur, click, rub or gallop Lungs: diminished breath sounds bibasilar Abdomen: soft, non-tender; bowel sounds normal; no masses,  no organomegaly Extremities: extremities normal, atraumatic, no cyanosis or edema Wound: Wound VAC in place and functioning  Lab  Results: CBC: Recent Labs    12/03/19 0525 12/04/19 0340  WBC 14.8* 13.8*  HGB 7.6* 7.5*  HCT 25.1* 25.2*  PLT 190 154   BMET:  Recent Labs    12/03/19 1043 12/04/19 0340  NA 134* 133*  K 4.4 5.0  CL 100 98  CO2 26 23  GLUCOSE 151* 150*  BUN 44* 60*  CREATININE 4.30* 5.35*  CALCIUM 6.2* 7.1*    CMET: Lab Results  Component Value Date   WBC 13.8 (H) 12/04/2019   HGB 7.5 (L) 12/04/2019   HCT 25.2 (L) 12/04/2019   PLT 154 12/04/2019   GLUCOSE 150 (H) 12/04/2019   CHOL 122 02/15/2019   TRIG 85 02/15/2019   HDL 46 02/15/2019   LDLCALC 59 02/15/2019   ALT 81 (H) 11/23/2019   AST 44 (H) 11/23/2019   NA 133 (L) 12/04/2019   K 5.0 12/04/2019   CL 98 12/04/2019   CREATININE 5.35 (H) 12/04/2019   BUN 60 (H) 12/04/2019   CO2 23 12/04/2019   TSH 5.113 (H) 05/25/2016   INR 2.6 (H) 11/18/2019   HGBA1C 6.2 (H) 10/27/2019      PT/INR: No results for input(s): LABPROT, INR in the last 72 hours. Radiology: [facility] Chest Port 1 View  Result Date: 12/04/2019 CLINICAL DATA:  Reason for exam: chest tube in place  NSTEMI EXAM: PORTABLE CHEST 1 VIEW COMPARISON:  11/30/2019 FINDINGS: Feeding tube and central venous lines unchanged. Stable enlarged cardiac silhouette. Bilateral pleural effusions appears slightly increased. No pulmonary edema. Midline sternotomy.  Vascular stent in the SVC. IMPRESSION: 1. Mild increase in bilateral pleural effusions. 2. Stable cardiomegaly. 3. Stable support apparatus. Electronically Signed   By: Suzy Bouchard M.D.   On: 12/04/2019 07:12   Recent Results (from the past 240 hour(s))  Aerobic/Anaerobic Culture (surgical/deep wound)     Status: None   Collection Time: 11/25/19  8:30 AM   Specimen: Wound  Result Value Ref Range Status   Specimen Description WOUND CHEST  Final   Special Requests Normal  Final   Gram Stain   Final    NO WBC SEEN FEW GRAM POSITIVE COCCI RARE GRAM POSITIVE RODS    Culture   Final    FEW VANCOMYCIN RESISTANT  ENTEROCOCCUS ISOLATED NO ANAEROBES ISOLATED Performed at [facility], [address] [place], [place] [address]    Report Status 12/02/2019 FINAL  Final   Organism ID, Bacteria VANCOMYCIN RESISTANT ENTEROCOCCUS ISOLATED  Final      Susceptibility   Vancomycin resistant enterococcus isolated - MIC*    AMPICILLIN >=32 RESISTANT Resistant     VANCOMYCIN >=32 RESISTANT Resistant     GENTAMICIN SYNERGY SENSITIVE Sensitive     LINEZOLID 2 SENSITIVE Sensitive     * FEW VANCOMYCIN RESISTANT ENTEROCOCCUS ISOLATED    Assessment/Plan: S/P Procedure(s) (LRB):  STERNAL WOUND DEBRIDEMENT (N/A) APPLICATION OF WOUND VAC (N/A) Mobilize Waiting for LTAC placement  VANCOMYCIN RESISTANT ENTEROCOCCUS ISOLATED- 4/30 from wound now open  Stable today- change wound vac in am- sternal bone intact   Grace Isaac 12/04/2019 8:40 AM

## 2019-12-04 NOTE — Progress Notes (Signed)
Patient ID: Patrick [name], male   DOB: 01/28/1956, 64 y.o.   MRN: [id number] EVENING ROUNDS NOTE :     [address]       [place], [address]             [phone]                 5 Days Post-Op Procedure(s) (LRB): STERNAL WOUND DEBRIDEMENT (N/A) APPLICATION OF WOUND VAC (N/A)  Total Length of Stay:  LOS: 41 days  BP (!) 102/58   Pulse 85   Temp 98.5 F (36.9 C) (Oral)   Resp (!) 28   Ht 5' 6.5" (1.689 m)   Wt 90.2 kg   SpO2 100%   BMI 31.62 kg/m   .Intake/Output      05/08 0701 - 05/09 0700 05/09 0701 - 05/10 0700   P.O.  120   I.V. (mL/kg) 10 (0.1)    NG/GT 285    IV Piggyback 720 300   Total Intake(mL/kg) 1015 (11.3) 420 (4.7)   Drains     Other     Stool 2    Total Output 2    Net +1013 +420        Stool Occurrence 1 x 2 x     . sodium chloride Stopped (11/21/19 1749)  . sodium chloride    . sodium chloride    . sodium chloride    . feeding supplement (VITAL 1.5 CAL) 1,000 mL (12/02/19 1543)  . linezolid (ZYVOX) IV Stopped (12/04/19 1040)  . norepinephrine (LEVOPHED) Adult infusion Stopped (11/30/19 1629)  . vasopressin (PITRESSIN) infusion - *FOR SHOCK* Stopped (11/28/19 1302)     Lab Results  Component Value Date   WBC 13.8 (H) 12/04/2019   HGB 7.5 (L) 12/04/2019   HCT 25.2 (L) 12/04/2019   PLT 154 12/04/2019   GLUCOSE 150 (H) 12/04/2019   CHOL 122 02/15/2019   TRIG 85 02/15/2019   HDL 46 02/15/2019   LDLCALC 59 02/15/2019   ALT 81 (H) 11/23/2019   AST 44 (H) 11/23/2019   NA 133 (L) 12/04/2019   K 5.0 12/04/2019   CL 98 12/04/2019   CREATININE 5.35 (H) 12/04/2019   BUN 60 (H) 12/04/2019   CO2 23 12/04/2019   TSH 5.113 (H) 05/25/2016   INR 2.6 (H) 11/18/2019   HGBA1C 6.2 (H) 10/27/2019   Sat in chair 3 hours today Eating little more but not enough to remove feeding tube Will need wound vac change tomorrow  Grace Isaac MD  Beeper [phone] Office [phone] 12/04/2019 5:59 PM

## 2019-12-04 NOTE — Progress Notes (Signed)
[facility] Progress Note  Subjective:   No c/o this AM  L IJ TDC  Last HD 5/7  Vitals:   12/04/19 0400 12/04/19 0500 12/04/19 0600 12/04/19 0700  BP: 131/69 110/70 113/64 105/61  Pulse: 99 98 98 97  Resp: (!) 26 (!) 26 (!) 22 (!) 24  Temp:      TempSrc:      SpO2: 100% 100% 100% 98%  Weight:      Height:         Physical Exam: Mild tachypnea RRR No LEE L IJ TDC c/d/i EOMI Neuro intact, CN2-12 itnact NCAT S/nt/nd  OP HD: [name] MWF   4h 400/800   99.5kg   2K/3.5Ca   TDC   Heparin 3000 -Mircera 75 (last 3/22)  -Hectorol 1     Assessment/ Plan: 1.  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 SP I&D earlier this week sternal wound infection. Off CRRT tol iHD but limited UF.  2. ESRD: usual HD is MWF. Postop required CRRT due to hemodynamic instability.   Cont on MWF schedule: 4h, 2K/3.5Ca, TDC L IJ, no heparin, 2L UF, IVB albumin prn 3. BP/ volume: BP's 90's- 100's, better. CVP 4-5 , still 8-9kg under prior dry wt. Doing well w/o fluid removal last few sessions, BP's up and looks better 4.  Anemia ckd - Hb 7s. Getting ESA darbe 100ug qFri. 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 Nutrition: On dysphagia 3 diet with aspiration precautions based on results of swallow test.  Push protein 7.  Atrial fibrillation: With controlled ventricular response 8.   Dispo - for [employer]  12/04/2019, 8:17 AM   Recent Labs  Lab 12/02/19 0405 12/03/19 0525 12/03/19 1043 12/04/19 0340  K   < >  --  4.4 5.0  BUN   < >  --  44* 60*  CREATININE   < >  --  4.30* 5.35*  CALCIUM   < >  --  6.2* 7.1*  PHOS  --   --  3.0  --   HGB  --  7.6*  --  7.5*   < > = values in this interval not displayed.   Inpatient medications: . sodium chloride   Intravenous Once  . amiodarone  200 mg Oral BID  . apixaban  5 mg  Oral BID  . vitamin C  500 mg Oral BID  . Chlorhexidine Gluconate Cloth  6 each Topical Daily  . Chlorhexidine Gluconate Cloth  6 each Topical Q0600  . clopidogrel  75 mg Oral Daily  . darbepoetin (ARANESP) injection - DIALYSIS  100 mcg Intravenous Q Fri-HD  . feeding supplement (ENSURE ENLIVE)  237 mL Oral TID BM  . feeding supplement (PRO-STAT SUGAR FREE 64)  30 mL Per Tube QID  . insulin aspart  0-9 Units Subcutaneous Q4H  . insulin glargine  6 Units Subcutaneous Daily  . midodrine  20 mg Oral TID WC  . multivitamin  1 tablet Oral QHS  . pantoprazole  40 mg Oral Daily  . rosuvastatin  10 mg Oral q1800  . sodium chloride flush  10-40 mL Intracatheter Q12H   . sodium chloride Stopped (11/21/19 1749)  . sodium chloride    . sodium chloride    . sodium chloride    . feeding supplement (VITAL 1.5 CAL) 1,000 mL (  12/02/19 1543)  . linezolid (ZYVOX) IV Stopped (12/03/19 2227)  . norepinephrine (LEVOPHED) Adult infusion Stopped (11/30/19 1629)  . vasopressin (PITRESSIN) infusion - *FOR SHOCK* Stopped (11/28/19 1302)   sodium chloride, Place/Maintain arterial line **AND** sodium chloride, Place/Maintain arterial line **AND** sodium chloride, acetaminophen (TYLENOL) oral liquid 160 mg/5 mL, dextrose, diphenhydrAMINE, fentaNYL (SUBLIMAZE) injection, Gerhardt's butt cream, levalbuterol, lip balm, metoprolol tartrate, ondansetron (ZOFRAN) IV, oxyCODONE, phenol, sodium chloride flush

## 2019-12-05 LAB — CBC
HCT: 24.3 % — ABNORMAL LOW (ref 39.0–52.0)
Hemoglobin: 7.3 g/dL — ABNORMAL LOW (ref 13.0–17.0)
MCH: 28.3 pg (ref 26.0–34.0)
MCHC: 30 g/dL (ref 30.0–36.0)
MCV: 94.2 fL (ref 80.0–100.0)
Platelets: 137 10*3/uL — ABNORMAL LOW (ref 150–400)
RBC: 2.58 MIL/uL — ABNORMAL LOW (ref 4.22–5.81)
RDW: 20.7 % — ABNORMAL HIGH (ref 11.5–15.5)
WBC: 12.9 10*3/uL — ABNORMAL HIGH (ref 4.0–10.5)
nRBC: 0.3 % — ABNORMAL HIGH (ref 0.0–0.2)

## 2019-12-05 LAB — BASIC METABOLIC PANEL
Anion gap: 15 (ref 5–15)
BUN: 81 mg/dL — ABNORMAL HIGH (ref 8–23)
CO2: 21 mmol/L — ABNORMAL LOW (ref 22–32)
Calcium: 6.6 mg/dL — ABNORMAL LOW (ref 8.9–10.3)
Chloride: 99 mmol/L (ref 98–111)
Creatinine, Ser: 6.71 mg/dL — ABNORMAL HIGH (ref 0.61–1.24)
GFR calc Af Amer: 9 mL/min — ABNORMAL LOW (ref >60)
GFR calc non Af Amer: 8 mL/min — ABNORMAL LOW (ref >60)
Glucose, Bld: 124 mg/dL — ABNORMAL HIGH (ref 70–99)
Potassium: 5.3 mmol/L — ABNORMAL HIGH (ref 3.5–5.1)
Sodium: 135 mmol/L (ref 135–145)

## 2019-12-05 LAB — GLUCOSE, CAPILLARY
Glucose-Capillary: 100 mg/dL — ABNORMAL HIGH (ref 70–99)
Glucose-Capillary: 126 mg/dL — ABNORMAL HIGH (ref 70–99)
Glucose-Capillary: 116 mg/dL — ABNORMAL HIGH (ref 70–99)
Glucose-Capillary: 121 mg/dL — ABNORMAL HIGH (ref 70–99)
Glucose-Capillary: 150 mg/dL — ABNORMAL HIGH (ref 70–99)
Glucose-Capillary: 101 mg/dL — ABNORMAL HIGH (ref 70–99)
Glucose-Capillary: 108 mg/dL — ABNORMAL HIGH (ref 70–99)
Glucose-Capillary: 143 mg/dL — ABNORMAL HIGH (ref 70–99)

## 2019-12-05 LAB — COOXEMETRY PANEL
Carboxyhemoglobin: 1.3 % (ref 0.5–1.5)
Methemoglobin: 0.8 % (ref 0.0–1.5)
O2 Saturation: 57 %
Total hemoglobin: 12.4 g/dL (ref 12.0–16.0)

## 2019-12-05 MED ORDER — ALBUMIN HUMAN 25 % IV SOLN
25.0000 g | Freq: Once | INTRAVENOUS | Status: AC
  Administered 2019-12-05: 14:00:00 25 g via INTRAVENOUS
  Filled 2019-12-05: qty 100

## 2019-12-05 MED ORDER — HEPARIN SODIUM (PORCINE) 1000 UNIT/ML DIALYSIS
1500.0000 [IU] | INTRAMUSCULAR | Status: DC | PRN
  Filled 2019-12-05: qty 2

## 2019-12-05 MED ORDER — ASPIRIN EC 81 MG PO TBEC
81.0000 mg | DELAYED_RELEASE_TABLET | Freq: Every day | ORAL | Status: DC
  Administered 2019-12-05 – 2019-12-06 (×2): 81 mg via ORAL
  Filled 2019-12-05 (×3): qty 1

## 2019-12-05 MED ORDER — MIDODRINE HCL 5 MG PO TABS
10.0000 mg | ORAL_TABLET | Freq: Once | ORAL | Status: AC
  Administered 2019-12-05: 14:00:00 10 mg via ORAL
  Filled 2019-12-05: qty 2

## 2019-12-05 MED ORDER — HEPARIN SODIUM (PORCINE) 1000 UNIT/ML DIALYSIS
40.0000 [IU]/kg | INTRAMUSCULAR | Status: DC | PRN
  Filled 2019-12-05: qty 4

## 2019-12-05 MED ORDER — HEPARIN SODIUM (PORCINE) 1000 UNIT/ML DIALYSIS: 1500.0000 [IU] | INTRAMUSCULAR | Status: DC | PRN

## 2019-12-05 MED ORDER — ALBUMIN HUMAN 5 % IV SOLN
INTRAVENOUS | Status: AC
  Filled 2019-12-05: qty 500

## 2019-12-05 NOTE — Progress Notes (Signed)
EVENING ROUNDS NOTE :     [address]       [place] [address]             [phone]                 6 Days Post-Op Procedure(s) (LRB): STERNAL WOUND DEBRIDEMENT (N/A) APPLICATION OF WOUND VAC (N/A)   Total Length of Stay:  LOS: 42 days  Events:  No events Will need to discuss plans for epicardial wires if pt is to transfer tomorrow.    BP (!) 98/54   Pulse 99   Temp 97.8 F (36.6 C) (Oral)   Resp (!) 23   Ht 5' 6.5" (1.689 m)   Wt 91.6 kg   SpO2 100%   BMI 32.11 kg/m         . sodium chloride Stopped (11/21/19 1749)  . sodium chloride    . sodium chloride    . sodium chloride Stopped (12/05/19 0144)  . feeding supplement (VITAL 1.5 CAL) 1,000 mL (12/02/19 1543)  . linezolid (ZYVOX) IV 600 mg (12/05/19 1349)  . norepinephrine (LEVOPHED) Adult infusion Stopped (11/30/19 1629)  . vasopressin (PITRESSIN) infusion - *FOR SHOCK* Stopped (11/28/19 1302)    I/O last 3 completed shifts: In: 2467.5 [P.O.:120; I.V.:72.5; NG/GT:1375; IV Piggyback:900] Out: 5 [Stool:5]   CBC Latest Ref Rng & Units 12/05/2019 12/04/2019 12/03/2019  WBC 4.0 - 10.5 K/uL 12.9(H) 13.8(H) 14.8(H)  Hemoglobin 13.0 - 17.0 g/dL 7.3(L) 7.5(L) 7.6(L)  Hematocrit 39.0 - 52.0 % 24.3(L) 25.2(L) 25.1(L)  Platelets 150 - 400 K/uL 137(L) 154 190    BMP Latest Ref Rng & Units 12/05/2019 12/04/2019 12/03/2019  Glucose 70 - 99 mg/dL 124(H) 150(H) 151(H)  BUN 8 - 23 mg/dL 81(H) 60(H) 44(H)  Creatinine 0.61 - 1.24 mg/dL 6.71(H) 5.35(H) 4.30(H)  BUN/Creat Ratio 10 - 24 - - -  Sodium 135 - 145 mmol/L 135 133(L) 134(L)  Potassium 3.5 - 5.1 mmol/L 5.3(H) 5.0 4.4  Chloride 98 - 111 mmol/L 99 98 100  CO2 22 - 32 mmol/L 21(L) 23 26  Calcium 8.9 - 10.3 mg/dL 6.6(L) 7.1(L) 6.2(LL)    ABG    Component Value Date/Time   PHART 7.375 11/20/2019 0626   PCO2ART 39.8 11/20/2019 0626   PO2ART 103 11/20/2019 0626   HCO3 23.4 11/20/2019 0626   TCO2 25 11/20/2019 0626   ACIDBASEDEF 2.0 11/20/2019  0626   O2SAT 57.0 12/05/2019 0309       [name], MD 12/05/2019 3:50 PM

## 2019-12-05 NOTE — Progress Notes (Addendum)
[facility] Progress Note   Assessment/ Plan:   OP HD: [name] MWF   4h 400/800   99.5kg   2K/3.5Ca   TDC   Heparin 3000 -Mircera 75 (last 3/22)  -Hectorol 1    Assessment/ Plan: 1.  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 SP I&D earlier this week sternal wound infection. Off CRRT tol iHD but limited UF. Hopeful for 2L UF today.  2. ESRD: usual HD is MWF. Postop required CRRT due to hemodynamic instability.   Cont on MWF schedule: 4h, 2K/3.5Ca, TDC L IJ, no heparin, 2L UF, IVB albumin prn.  Have decreased machine temp, UF profile 4 3. BP/ volume: BP's 90's- 100's, better. CVP 4-5 , still 8-9kg under prior dry wt. UF as tolerated today 4.  Anemia ckd - Hb 7s. Getting ESA darbe 100ug qFri. 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 Nutrition: On dysphagia 3 diet with aspiration precautions based on results of swallow test.  Push protein 7.  Atrial fibrillation: With controlled ventricular response 8.   Dispo - for LTACH  Subjective:    Seen in room.  For HD today.  Having some increased swelling, will try for some vol removal today as O2 req increasing too.  HD RN staff notified that pt is a priority.   Objective:   BP (!) 88/45   Pulse 90   Temp 100 F (37.8 C) (Oral)   Resp (!) 28   Ht 5' 6.5" (1.689 m)   Wt 90.2 kg   SpO2 94%   BMI 31.62 kg/m   Physical Exam: Gen: sitting in bed, eating CVS: RRR no m/r/g Resp: speaking in short sentences, muffled at bases Abd: + NABS Ext: no LE edema, 2+ upper extremity edema ACCESS: L IJ TDC  Labs: BMET Recent Labs  Lab 11/29/19 0343 11/30/19 0500 12/01/19 0320 12/02/19 0405 12/03/19 1043 12/04/19 0340 12/05/19 0309  NA 132* 131* 133* 132* 134* 133* 135  K 4.0 4.6 4.1 3.7 4.4 5.0 5.3*  CL 101 97* 98 99 100 98 99  CO2 18* 20* 21* 24 26 23  21*  GLUCOSE 213* 241*  184* 137* 151* 150* 124*  BUN 98* 47* 71* 47* 44* 60* 81*  CREATININE 9.35* 5.86* 7.46* 4.63* 4.30* 5.35* 6.71*  CALCIUM 5.9* 6.1* 5.8* 5.8* 6.2* 7.1* 6.6*  PHOS  --   --   --   --  3.0  --   --    CBC Recent Labs  Lab 12/02/19 0405 12/03/19 0525 12/04/19 0340 12/05/19 0309  WBC 13.6* 14.8* 13.8* 12.9*  HGB 8.0* 7.6* 7.5* 7.3*  HCT 26.1* 25.1* 25.2* 24.3*  MCV 91.3 94.4 94.7 94.2  PLT 215 190 154 137*      Medications:    . sodium chloride   Intravenous Once  . amiodarone  200 mg Oral BID  . apixaban  5 mg Oral BID  . vitamin C  500 mg Oral BID  . Chlorhexidine Gluconate Cloth  6 each Topical Daily  . Chlorhexidine Gluconate Cloth  6 each Topical Q0600  . clopidogrel  75 mg Oral Daily  . darbepoetin (ARANESP) injection - DIALYSIS  100 mcg Intravenous Q Fri-HD  . feeding supplement (ENSURE ENLIVE)  237 mL Oral TID BM  . feeding supplement (PRO-STAT SUGAR FREE 64)  30 mL Per Tube QID  . insulin aspart  0-9 Units Subcutaneous Q4H  . insulin glargine  6 Units Subcutaneous Daily  . midodrine  20 mg Oral TID WC  . multivitamin  1 tablet Oral QHS  . pantoprazole  40 mg Oral Daily  . rosuvastatin  10 mg Oral q1800  . sodium chloride flush  10-40 mL Intracatheter Q12H     Madelon Lips, MD [facility] pgr [phone] 12/05/2019, 9:25 AM

## 2019-12-05 NOTE — Progress Notes (Addendum)
6 Days Post-Op Procedure(s) (LRB): STERNAL WOUND DEBRIDEMENT (N/A) APPLICATION OF WOUND VAC (N/A) Subjective: Wound vac changed by Wound nurse today- sternum tender tachypneic with wheezes on exam nsr BP 88/55 wil need HD in ICU today Objective: Vital signs in last 24 hours: Temp:  [98.3 F (36.8 C)-100 F (37.8 C)] 100 F (37.8 C) (05/10 0700) Pulse Rate:  [83-104] 90 (05/10 0738) Cardiac Rhythm: Normal sinus rhythm (05/10 0000) Resp:  [19-38] 28 (05/10 0738) BP: (88-132)/(45-88) 88/45 (05/10 0700) SpO2:  [78 %-100 %] 94 % (05/10 0700) FiO2 (%):  [32 %] 32 % (05/09 1407)  Hemodynamic parameters for last 24 hours:    Intake/Output from previous day: 05/09 0701 - 05/10 0700 In: 852.5 [P.O.:120; I.V.:62.5; NG/GT:70; IV Piggyback:600] Out: 3 [Stool:3] Intake/Output this shift: Total I/O In: 245 [I.V.:10; NG/GT:235] Out: -   EXAM Alert and responsive Wound vac sponge on suction Lab Results: Recent Labs    12/04/19 0340 12/05/19 0309  WBC 13.8* 12.9*  HGB 7.5* 7.3*  HCT 25.2* 24.3*  PLT 154 137*   BMET:  Recent Labs    12/04/19 0340 12/05/19 0309  NA 133* 135  K 5.0 5.3*  CL 98 99  CO2 23 21*  GLUCOSE 150* 124*  BUN 60* 81*  CREATININE 5.35* 6.71*  CALCIUM 7.1* 6.6*    PT/INR: No results for input(s): LABPROT, INR in the last 72 hours. ABG    Component Value Date/Time   PHART 7.375 11/20/2019 0626   HCO3 23.4 11/20/2019 0626   TCO2 25 11/20/2019 0626   ACIDBASEDEF 2.0 11/20/2019 0626   O2SAT 57.0 12/05/2019 0309   CBG (last 3)  Recent Labs    12/05/19 0413 12/05/19 0738 12/05/19 1125  GLUCAP 100* 126* 121*    Assessment/Plan: S/P Procedure(s) (LRB): STERNAL WOUND DEBRIDEMENT (N/A) APPLICATION OF WOUND VAC (N/A) Cont Zyvox for VRE in superficial sternal wound tx to LTAC when bed available  LOS: 42 days    Tharon Aquas Trigt III 12/05/2019

## 2019-12-05 NOTE — Progress Notes (Signed)
Physical Therapy Treatment Patient Details Name: Patrick [name] MRN: [id number] DOB: [birth year] Today's Date: 12/05/2019    History of Present Illness Pt is a 64 yo male s/p re-do CABGx3 with Aortic valve replacement. Pt has been on CRRT but removed 4/11 due to hypotension and VTach with return to CRRT 4/12 pt with ileus. 4/23  While patient undergoing gated CT (to further evaluate pericardium) he had a PEA arrest. CPR done for approximately 3 minutes with ROSC and patient intubated. S/p sternal debridement with placement of wound vac 11/29/19. PMHx: ESRD MWF, HTN, PNA, T2DM, MI.    PT Comments    Pt pleasant and willing to attempt to stand and perform HEP on arrival. Pt on 5L with sats 100% and decreased to 4L throughout session with SpO2 99%. Utilized foot egress to transition to sitting and pt performed HEP then reported dizziness and denied sitting, standing or OOB to chair with return to supine. Pt educated for HEP, progressive activity and need to allow body to accommodate to change in position and progressive mobility. Pt voiced understanding and desire to improve but continued to refuse further mobility.   BP supine 87/57 (67) Sitting 98/51 (65) HR 90 SpO2 100% on 4L    Follow Up Recommendations  LTACH;Supervision/Assistance - 24 hour     Equipment Recommendations  None recommended by PT    Recommendations for Other Services       Precautions / Restrictions Precautions Precautions: Fall;Sternal;Other (comment) Precaution Comments: external pacer, sternal wound vac, Reviewed sternal precautions verbally Other Brace: B orthotic shoes    Mobility  Bed Mobility Overal bed mobility: Needs Assistance Bed Mobility: Supine to Sit;Sit to Supine           General bed mobility comments: utilized foot egress to transition from supine<>sitting. In full chair pt reported dizziness without vestibular symptoms and stable vital signs and declined any attempt at standing or transfer to  chair. Max +2 to slide toward [facility]  Transfers                    Ambulation/Gait                 Stairs             Wheelchair Mobility    Modified Rankin (Stroke Patients Only)       Balance                                            Cognition Arousal/Alertness: Awake/alert Behavior During Therapy: WFL for tasks assessed/performed Overall Cognitive Status: Impaired/Different from baseline                       Memory: Decreased short-term memory;Decreased recall of precautions         General Comments: able to partially remember sternal precautions; overall slow to process      Exercises General Exercises - Lower Extremity Long Arc Quad: Both;Seated;15 reps;AROM Heel Slides: AROM;Both;15 reps;Supine Hip ABduction/ADduction: AROM;Both;10 reps;Seated    General Comments        Pertinent Vitals/Pain Pain Score: 7  Pain Location: generalized Pain Descriptors / Indicators: Discomfort;Aching Pain Intervention(s): Limited activity within patient's tolerance;Repositioned;Monitored during session    Home Living                      Prior  Function            PT Goals (current goals can now be found in the care plan section) Acute Rehab PT Goals Time For Goal Achievement: 12/19/19 Potential to Achieve Goals: Fair Progress towards PT goals: Not progressing toward goals - comment;Goals downgraded-see care plan    Frequency    Min 2X/week      PT Plan Current plan remains appropriate    Co-evaluation              AM-PAC PT "6 Clicks" Mobility   Outcome Measure    Help needed moving from lying on your back to sitting on the side of a flat bed without using bedrails?: A Lot Help needed moving to and from a bed to a chair (including a wheelchair)?: Total Help needed standing up from a chair using your arms (e.g., wheelchair or bedside chair)?: Total Help needed to walk in hospital room?:  Total Help needed climbing 3-5 steps with a railing? : Total 6 Click Score: 6    End of Session Equipment Utilized During Treatment: Oxygen Activity Tolerance: Other (comment)(pt limited by dizziness and refused standing or OOB) Patient left: in bed;with call bell/phone within reach;with family/visitor present;with bed alarm set Nurse Communication: Mobility status;Need for lift equipment PT Visit Diagnosis: Muscle weakness (generalized) (M62.81);Other abnormalities of gait and mobility (R26.89)     Time: 8280-0349 PT Time Calculation (min) (ACUTE ONLY): 26 min  Charges:  $Therapeutic Exercise: 8-22 mins $Therapeutic Activity: 8-22 mins                     Ausha Sieh P, PT Acute Rehabilitation Services Pager: [phone] Office: [place] Jelena Malicoat 12/05/2019, 1:11 PM

## 2019-12-05 NOTE — Consult Note (Signed)
[place] Nurse wound follow up Patient receiving care in [facility] 2h04. Wife at bedside. Wound type: sternal surgical wound Measurement: 15 cm x 5 cm x 4 cm Wound bed: pink Drainage (amount, consistency, odor) tan in cannister. Some type of dressing in bottom of upper aspect of wound, covering the wires and sternal stabilizer.  I understand Dr. Orvan Seen replaced the non-functioning VAC dressing in the last 2 - 3 days and placed this dressing in the base of the wound.  I removed as much of the dressing as I could--which it had turned to thick tan mush that fell apart with manipulation. Periwound: intact Dressing procedure/placement/frequency: MWF VAC dressing change.  I understand the patient will likely be discharged to [facility] tomorrow, therefore additional VAC dressing not ordered. Val Riles, RN, MSN, CWOCN, CNS-BC, pager [phone]

## 2019-12-06 LAB — CBC
HCT: 21.1 % — ABNORMAL LOW (ref 39.0–52.0)
Hemoglobin: 6.3 g/dL — CL (ref 13.0–17.0)
MCH: 28.4 pg (ref 26.0–34.0)
MCHC: 29.9 g/dL — ABNORMAL LOW (ref 30.0–36.0)
MCV: 95 fL (ref 80.0–100.0)
Platelets: 130 10*3/uL — ABNORMAL LOW (ref 150–400)
RBC: 2.22 MIL/uL — ABNORMAL LOW (ref 4.22–5.81)
RDW: 20.5 % — ABNORMAL HIGH (ref 11.5–15.5)
WBC: 9 10*3/uL (ref 4.0–10.5)
nRBC: 0.2 % (ref 0.0–0.2)

## 2019-12-06 LAB — SARS CORONAVIRUS 2 (TAT 6-24 HRS): SARS Coronavirus 2: NEGATIVE

## 2019-12-06 LAB — MRSA PCR SCREENING: MRSA by PCR: NEGATIVE

## 2019-12-06 LAB — GLUCOSE, CAPILLARY
Glucose-Capillary: 102 mg/dL — ABNORMAL HIGH (ref 70–99)
Glucose-Capillary: 108 mg/dL — ABNORMAL HIGH (ref 70–99)
Glucose-Capillary: 110 mg/dL — ABNORMAL HIGH (ref 70–99)
Glucose-Capillary: 110 mg/dL — ABNORMAL HIGH (ref 70–99)
Glucose-Capillary: 131 mg/dL — ABNORMAL HIGH (ref 70–99)
Glucose-Capillary: 155 mg/dL — ABNORMAL HIGH (ref 70–99)

## 2019-12-06 LAB — PREPARE RBC (CROSSMATCH)

## 2019-12-06 MED ORDER — HEPARIN SODIUM (PORCINE) 1000 UNIT/ML IJ SOLN
INTRAMUSCULAR | Status: AC
  Filled 2019-12-06: qty 4

## 2019-12-06 MED ORDER — SODIUM CHLORIDE 0.9% IV SOLUTION: Freq: Once | INTRAVENOUS | Status: AC

## 2019-12-06 MED ORDER — ALBUMIN HUMAN 25 % IV SOLN
INTRAVENOUS | Status: AC
  Administered 2019-12-06: 25 g via INTRAVENOUS
  Filled 2019-12-06: qty 200

## 2019-12-06 MED ORDER — DARBEPOETIN ALFA 200 MCG/0.4ML IJ SOSY
200.0000 ug | PREFILLED_SYRINGE | INTRAMUSCULAR | Status: DC
  Administered 2019-12-09: 200 ug via INTRAVENOUS
  Filled 2019-12-06: qty 0.4

## 2019-12-06 NOTE — Progress Notes (Signed)
Pacemaker wires removed.  Wires intact upon removal with no tissue present on end of wires.  Patient tolerated procedure well and educated on signs and symptoms of cardiac tamponade.  Patient to remain on bedrest for one hour.

## 2019-12-06 NOTE — Progress Notes (Signed)
Occupational Therapy Treatment Patient Details Name: Patrick [name] MRN: [id number] DOB: Sep 23, 1955 Today's Date: 12/06/2019    History of present illness Pt is a 64 yo male s/p re-do CABGx3 with Aortic valve replacement. Pt has been on CRRT but removed 4/11 due to hypotension and VTach with return to CRRT 4/12 pt with ileus. 4/23  While patient undergoing gated CT (to further evaluate pericardium) he had a PEA arrest. CPR done for approximately 3 minutes with ROSC and patient intubated. S/p sternal debridement with placement of wound vac 11/29/19. PMHx: ESRD MWF, HTN, PNA, T2DM, MI.   OT comments  Pt progressing with forward sitting and toleration of bed to chair egress setting for ~20 mins. Pt performing BUEs/BLEs sitting upright. Pt on bipap and O2 sats >90% O2. Pt requires continued OT skilled services for ADL, mobility and safety in [facility] setting. OT following.     Follow Up Recommendations  LTACH;Supervision/Assistance - 24 hour    Equipment Recommendations  None recommended by OT    Recommendations for Other Services      Precautions / Restrictions Precautions Precautions: Fall;Sternal;Other (comment) Precaution Comments: external pacer, sternal wound vac, Reviewed sternal precautions verbally Other Brace: B orthotic shoes Restrictions Weight Bearing Restrictions: Yes Other Position/Activity Restrictions: sternal precautions       Mobility Bed Mobility Overal bed mobility: Needs Assistance             General bed mobility comments: utilized foot egress to transition from supine to sitting; pt sitting forward x 5 mins in unsupported sitting in chair egress mode.   Transfers                 General transfer comment: deferred to work on trunk muscles    Balance Overall balance assessment: Needs assistance Sitting-balance support: Feet supported Sitting balance-Leahy Scale: Fair Sitting balance - Comments: Able to sit unsupported EOB at least 5 min before  sitting back. Pt's BLEs painful                                   ADL either performed or assessed with clinical judgement   ADL Overall ADL's : Needs assistance/impaired                                     Functional mobility during ADLs: Maximal assistance;Rolling walker General ADL Comments: Pt decreased ability to care for self, decreased strength and decreased activity tolerance.     Vision   Vision Assessment?: Vision impaired- to be further tested in functional context   Perception     Praxis      Cognition Arousal/Alertness: Awake/alert Behavior During Therapy: WFL for tasks assessed/performed Overall Cognitive Status: Impaired/Different from baseline                       Memory: Decreased short-term memory;Decreased recall of precautions         General Comments: Pt slow to process and requiring increased time.        Exercises Exercises: Other exercises General Exercises - Upper Extremity Shoulder Flexion: Both;10 reps;Seated Elbow Flexion: Both;10 reps;Seated Elbow Extension: Both;10 reps;Seated Wrist Flexion: AROM;Both;10 reps;Seated Wrist Extension: AROM;Both;10 reps;Seated Digit Composite Flexion: AROM;Both;10 reps;Seated Composite Extension: AROM;Both;10 reps;Seated General Exercises - Lower Extremity Long Arc Quad: Both;10 reps;Seated   Shoulder Instructions  General Comments O2>90% on bipap. Spouse in room and very supportive of pt    Pertinent Vitals/ Pain       Pain Assessment: Faces Faces Pain Scale: Hurts little more Pain Location: b/l feet Pain Descriptors / Indicators: Discomfort;Grimacing;Guarding;Sore Pain Intervention(s): Monitored during session  Home Living                                          Prior Functioning/Environment              Frequency  Min 2X/week        Progress Toward Goals  OT Goals(current goals can now be found in the care plan  section)  Progress towards OT goals: Progressing toward goals  Acute Rehab OT Goals OT Goal Formulation: With patient Time For Goal Achievement: 12/15/19 Potential to Achieve Goals: Good ADL Goals Pt Will Perform Grooming: with min guard assist;sitting Pt Will Perform Lower Body Dressing: with mod assist;sitting/lateral leans;sit to/from stand Pt Will Transfer to Toilet: with mod assist;stand pivot transfer;bedside commode Pt Will Perform Toileting - Clothing Manipulation and hygiene: with mod assist;sitting/lateral leans;sit to/from stand;with adaptive equipment Pt/caregiver will Perform Home Exercise Program: Increased ROM;With written HEP provided Additional ADL Goal #1: Pt will tolerate x10 mins of ADL tasks with O2 sats >90% with 1 rest break in order to increase activity tolerance.  Plan Discharge plan remains appropriate    Co-evaluation                 AM-PAC OT "6 Clicks" Daily Activity     Outcome Measure   Help from another person eating meals?: A Little Help from another person taking care of personal grooming?: A Little Help from another person toileting, which includes using toliet, bedpan, or urinal?: Total Help from another person bathing (including washing, rinsing, drying)?: A Lot Help from another person to put on and taking off regular upper body clothing?: A Lot Help from another person to put on and taking off regular lower body clothing?: Total 6 Click Score: 12    End of Session Equipment Utilized During Treatment: Oxygen(on bipap)  OT Visit Diagnosis: Unsteadiness on feet (R26.81);Other abnormalities of gait and mobility (R26.89);Muscle weakness (generalized) (M62.81);Pain;Other symptoms and signs involving cognitive function Pain - Right/Left: Left Pain - part of body: Leg   Activity Tolerance Patient tolerated treatment well   Patient Left in bed;with call bell/phone within reach;with family/visitor present   Nurse Communication Mobility  status        Time: 8366-2947 OT Time Calculation (min): 32 min  Charges: OT General Charges $OT Visit: 1 Visit OT Treatments $Therapeutic Activity: 8-22 mins $Therapeutic Exercise: 8-22 mins  Jefferey Pica, OTR/L Acute Rehabilitation Services Pager: [phone] Office: [phone]    Aiven Kampe C 12/06/2019, 2:39 PM

## 2019-12-06 NOTE — Progress Notes (Addendum)
[facility] Progress Note   Assessment/ Plan:   OP HD: [name] MWF   4h 400/800   99.5kg   2K/3.5Ca   TDC   Heparin 3000 -Mircera 75 (last 3/22)  -Hectorol 1    Assessment/ Plan: 1.  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 SP I&D earlier this week sternal wound infection. Off CRRT tol iHD but limited UF. Hopeful for 2L UF today.  2. ESRD: usual HD is MWF. Postop required CRRT due to hemodynamic instability.   Cont on MWF schedule: 4h, 2K/3.5Ca, TDC L IJ, no heparin, 2L UF, IVB albumin prn.  Have decreased machine temp, UF profile 4.  Extra sequential Rx today for 3 hrs 3. BP/ volume: BP's 90's- 100's, better. CVP 4-5 , still 8-9kg under prior dry wt. UF as tolerated today 4.  Anemia ckd - Hb 7s. Getting ESA darbe 100ug qFri. CTM, increased to 200 mcg for Friday.  Transfusion prn, can give with HD today if desired. 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 Nutrition: On dysphagia 3 diet with aspiration precautions based on results of swallow test.  Push protein 7.  Atrial fibrillation: With controlled ventricular response 8.   Dispo - for LTACH  Subjective:    Seen in room. Got 2L UF off in HD yesterday.  Still tachypneic with SOB.  Discussed extra rx today--> pt agreeable.  Hbg down to 6.3   Objective:   BP (!) 84/57   Pulse 96   Temp 98.5 F (36.9 C) (Oral)   Resp (!) 24   Ht 5' 6.5" (1.689 m)   Wt 94.1 kg   SpO2 100%   BMI 32.98 kg/m   Physical Exam: Gen: sitting in bed, eating CVS: RRR no m/r/g Resp: speaking in short sentences, muffled at bases, tachypneic Abd: + NABS Ext: no LE edema, 2+ upper extremity edema ACCESS: L IJ TDC  Labs: BMET Recent Labs  Lab 11/30/19 0500 12/01/19 0320 12/02/19 0405 12/03/19 1043 12/04/19 0340 12/05/19 0309  NA 131* 133* 132* 134* 133* 135  K 4.6 4.1 3.7 4.4 5.0 5.3*   CL 97* 98 99 100 98 99  CO2 20* 21* 24 26 23  21*  GLUCOSE 241* 184* 137* 151* 150* 124*  BUN 47* 71* 47* 44* 60* 81*  CREATININE 5.86* 7.46* 4.63* 4.30* 5.35* 6.71*  CALCIUM 6.1* 5.8* 5.8* 6.2* 7.1* 6.6*  PHOS  --   --   --  3.0  --   --    CBC Recent Labs  Lab 12/03/19 0525 12/04/19 0340 12/05/19 0309 12/06/19 0414  WBC 14.8* 13.8* 12.9* 9.0  HGB 7.6* 7.5* 7.3* 6.3*  HCT 25.1* 25.2* 24.3* 21.1*  MCV 94.4 94.7 94.2 95.0  PLT 190 154 137* 130*      Medications:    . sodium chloride   Intravenous Once  . sodium chloride   Intravenous Once  . amiodarone  200 mg Oral BID  . apixaban  5 mg Oral BID  . vitamin C  500 mg Oral BID  . aspirin EC  81 mg Oral Daily  . Chlorhexidine Gluconate Cloth  6 each Topical Daily  . Chlorhexidine Gluconate Cloth  6 each Topical Q0600  . clopidogrel  75 mg Oral Daily  . [START ON 12/09/2019] darbepoetin (ARANESP) injection - DIALYSIS  200 mcg Intravenous Q Fri-HD  . feeding supplement (ENSURE ENLIVE)  237 mL Oral TID BM  . feeding supplement (PRO-STAT SUGAR FREE 64)  30 mL Per Tube QID  . insulin aspart  0-9 Units Subcutaneous Q4H  . insulin glargine  6 Units Subcutaneous Daily  . midodrine  20 mg Oral TID WC  . multivitamin  1 tablet Oral QHS  . pantoprazole  40 mg Oral Daily  . rosuvastatin  10 mg Oral q1800  . sodium chloride flush  10-40 mL Intracatheter Q12H     Madelon Lips, MD [facility] pgr [phone] 12/06/2019, 10:01 AM

## 2019-12-06 NOTE — Progress Notes (Signed)
Patient ID: Patrick [name], male   DOB: [birth year], 64 y.o.   MRN: [id number] Patient ID: Patrick [name], male   DOB: Nov 30, 1955, 64 y.o.   MRN: [id number] Patient ID: Patrick [name], male   DOB: 06-04-1956, 64 y.o.   MRN: [id number] TCTS DAILY ICU PROGRESS NOTE                   [address]            [place] [address]          [phone]   7 Days Post-Op Procedure(s) (LRB): STERNAL WOUND DEBRIDEMENT (N/A) APPLICATION OF WOUND VAC (N/A)  Date of Procedure:    10/28/19 Preoperative Diagnosis:      Severe 3-vessel Coronary Artery Disease Postoperative Diagnosis:    Same with moderate aortic valve insufficiency Procedure:      Aortic valve replacement (23 mm Edwards Intuity bovine bioprosthetic) Coronary Artery Bypass Grafting x 3             Reverse saphenous vein graft to Distal Left Anterior Descending Coronary Artery; reverse saphenous Vein Graft to Posterior Descending Coronary Artery and distal obtuse Marginal Branch of Left Circumflex Coronary Artery; Endoscopic Vein Harvest from right thigh and Lower Leg Surgeon:        B.  Murvin Natal, MD Total Length of Stay:  LOS: 43 days   Subjective: Patient awake , more sob today back on bipap  Objective: Vital signs in last 24 hours: Temp:  [97.4 F (36.3 C)-99 F (37.2 C)] 98.5 F (36.9 C) (05/11 1120) Pulse Rate:  [86-115] 96 (05/11 0900) Cardiac Rhythm: Sinus tachycardia (05/11 0400) Resp:  [18-35] 24 (05/11 0900) BP: (80-138)/(44-121) 84/57 (05/11 0900) SpO2:  [91 %-100 %] 100 % (05/11 0900) Weight:  [91.6 kg-94.1 kg] 94.1 kg (05/10 1804)  Filed Weights   12/02/19 1045 12/05/19 1330 12/05/19 1804  Weight: 90.2 kg 91.6 kg 94.1 kg    Weight change:    Hemodynamic parameters for last 24 hours:    Intake/Output from previous day: 05/10 0701 - 05/11 0700 In: 1640 [P.O.:480; I.V.:10; NG/GT:800; IV Piggyback:300] Out: 2151 [Drains:150; Stool:1]  Intake/Output this shift: Total I/O In: 120  [P.O.:120] Out: -   Current Meds: Scheduled Meds: . sodium chloride   Intravenous Once  . sodium chloride   Intravenous Once  . amiodarone  200 mg Oral BID  . apixaban  5 mg Oral BID  . vitamin C  500 mg Oral BID  . aspirin EC  81 mg Oral Daily  . Chlorhexidine Gluconate Cloth  6 each Topical Daily  . Chlorhexidine Gluconate Cloth  6 each Topical Q0600  . clopidogrel  75 mg Oral Daily  . [START ON 12/09/2019] darbepoetin (ARANESP) injection - DIALYSIS  200 mcg Intravenous Q Fri-HD  . feeding supplement (ENSURE ENLIVE)  237 mL Oral TID BM  . feeding supplement (PRO-STAT SUGAR FREE 64)  30 mL Per Tube QID  . insulin aspart  0-9 Units Subcutaneous Q4H  . insulin glargine  6 Units Subcutaneous Daily  . midodrine  20 mg Oral TID WC  . multivitamin  1 tablet Oral QHS  . pantoprazole  40 mg Oral Daily  . rosuvastatin  10 mg Oral q1800  . sodium chloride flush  10-40 mL Intracatheter Q12H   Continuous Infusions: . sodium chloride Stopped (11/21/19 1749)  . sodium chloride    . sodium chloride    . sodium chloride Stopped (12/05/19 0144)  .  feeding supplement (VITAL 1.5 CAL) 1,000 mL (12/02/19 1543)  . linezolid (ZYVOX) IV 600 mg (12/05/19 2259)  . norepinephrine (LEVOPHED) Adult infusion Stopped (11/30/19 1629)   PRN Meds:.sodium chloride, Place/Maintain arterial line **AND** sodium chloride, Place/Maintain arterial line **AND** sodium chloride, acetaminophen (TYLENOL) oral liquid 160 mg/5 mL, dextrose, diphenhydrAMINE, fentaNYL (SUBLIMAZE) injection, Falicia Lizotte's butt cream, heparin, heparin, heparin, levalbuterol, lip balm, metoprolol tartrate, ondansetron (ZOFRAN) IV, oxyCODONE, phenol, sodium chloride flush  General appearance: alert and cooperative Neurologic: intact Heart: regular rate and rhythm, S1, S2 normal, no murmur, click, rub or gallop Lungs: diminished breath sounds bibasilar Abdomen: soft, non-tender; bowel sounds normal; no masses,  no organomegaly Extremities:  extremities normal, atraumatic, no cyanosis or edema Wound: Wound VAC in place and functioning- changed yesterday   Lab Results: CBC: Recent Labs    12/05/19 0309 12/06/19 0414  WBC 12.9* 9.0  HGB 7.3* 6.3*  HCT 24.3* 21.1*  PLT 137* 130*   BMET:  Recent Labs    12/04/19 0340 12/05/19 0309  NA 133* 135  K 5.0 5.3*  CL 98 99  CO2 23 21*  GLUCOSE 150* 124*  BUN 60* 81*  CREATININE 5.35* 6.71*  CALCIUM 7.1* 6.6*    CMET: Lab Results  Component Value Date   WBC 9.0 12/06/2019   HGB 6.3 (LL) 12/06/2019   HCT 21.1 (L) 12/06/2019   PLT 130 (L) 12/06/2019   GLUCOSE 124 (H) 12/05/2019   CHOL 122 02/15/2019   TRIG 85 02/15/2019   HDL 46 02/15/2019   LDLCALC 59 02/15/2019   ALT 81 (H) 11/23/2019   AST 44 (H) 11/23/2019   NA 135 12/05/2019   K 5.3 (H) 12/05/2019   CL 99 12/05/2019   CREATININE 6.71 (H) 12/05/2019   BUN 81 (H) 12/05/2019   CO2 21 (L) 12/05/2019   TSH 5.113 (H) 05/25/2016   INR 2.6 (H) 11/18/2019   HGBA1C 6.2 (H) 10/27/2019      PT/INR: No results for input(s): LABPROT, INR in the last 72 hours. Radiology: No results found. Recent Results (from the past 240 hour(s))  SARS CORONAVIRUS 2 (TAT 6-24 HRS) Nasopharyngeal Nasopharyngeal Swab     Status: None   Collection Time: 12/06/19  4:14 AM   Specimen: Nasopharyngeal Swab  Result Value Ref Range Status   SARS Coronavirus 2 NEGATIVE NEGATIVE Final    Comment: (NOTE) SARS-CoV-2 target nucleic acids are NOT DETECTED. The SARS-CoV-2 RNA is generally detectable in upper and lower respiratory specimens during the acute phase of infection. Negative results do not preclude SARS-CoV-2 infection, do not rule out co-infections with other pathogens, and should not be used as the sole basis for treatment or other patient management decisions. Negative results must be combined with clinical observations, patient history, and epidemiological information. The expected result is Negative. Fact Sheet for  Patients: SugarRoll.be Fact Sheet for Healthcare Providers: https://www.woods-mathews.com/ This test is not yet approved or cleared by the [place] FDA and  has been authorized for detection and/or diagnosis of SARS-CoV-2 by FDA under an Emergency Use Authorization (EUA). This EUA will remain  in effect (meaning this test can be used) for the duration of the COVID-19 declaration under Section 56 4(b)(1) of the Act, 21 U.S.C. section 360bbb-3(b)(1), unless the authorization is terminated or revoked sooner. Performed at [facility], [place] [address] [place], [place] [address]     Assessment/Plan: S/P Procedure(s) (LRB): STERNAL WOUND DEBRIDEMENT (N/A) APPLICATION OF WOUND VAC (N/A) Mobilize Waiting for LTAC placement  VANCOMYCIN RESISTANT  ENTEROCOCCUS ISOLATED- 4/30 from wound now open  Stable today- change wound vac in am- sternal bone intact  May need increased hgb- transfuse per renal   Grace Isaac 12/06/2019 11:25 AM

## 2019-12-06 NOTE — Progress Notes (Signed)
TCTS BRIEF SICU PROGRESS NOTE  7 Days Post-Op  S/P Procedure(s) (LRB): STERNAL WOUND DEBRIDEMENT (N/A) APPLICATION OF WOUND VAC (N/A)   Stable day  Plan: Continue current plan  Rexene Alberts, MD 12/06/2019 7:06 PM

## 2019-12-06 NOTE — TOC Progression Note (Signed)
Transition of Care [facility]) - Progression Note    Patient Details  Name: Patrick [name] MRN: [id number] Date of Birth: 10-04-1955  Transition of Care [facility]) CM/SW [place], RN Phone Number: 12/06/2019, 12:51 PM  Clinical Narrative:    Case management spoke with Kathee Delton, liaison RNCM with [facility] and patient was denied admittance to [facility] on 12/05/19 relating to the patient's scheduled outpatient appointments post hospitalization.  I spoke with Judson Roch, cardiology PA float, on 12/05/2019 and informed her of the denial of admittance based on the need for followup hospitalization appointments outside th facility of [facility].  The outpatient cardiology appointments will be delayed until after the patient's stay at [facility] according to Judson Roch, [place] with cardiology.  I called Kathee Delton, RNCM at [facility] and informed her of this and asked that the patient be reconsidered for admission.  Kathee Delton spoke with the director at [facility] and insurance authorization to be started today or tomorrow, so patient would be considered for admission once the patient is more medically stable for transfer and admission to [facility].  Will continue to follow up for possible admission to [facility].   Expected Discharge Plan: Long Term Acute Care (LTAC) Barriers to Discharge: Continued Medical Work up  Expected Discharge Plan and Services Expected Discharge Plan: Long Term Acute Care (LTAC)   Discharge Planning Services: CM Consult Post Acute Care Choice: Long Term Acute Care (LTAC) Living arrangements for the past 2 months: Apartment(lives in apartment on second floor - planning to change to 1st floor soon)                    Social Determinants of Health (SDOH) Interventions    Readmission Risk Interventions Readmission Risk Prevention Plan 11/24/2019  Transportation Screening Complete  Medication Review Press photographer) Complete  PCP or Specialist  appointment within 3-5 days of discharge Complete  HRI or Home Care Consult Complete  SW Recovery Care/Counseling Consult Complete  Palliative Care Screening Complete  Skilled Nursing Facility Complete  Some recent data might be hidden

## 2019-12-07 ENCOUNTER — Inpatient Hospital Stay (HOSPITAL_COMMUNITY): Payer: Medicare HMO

## 2019-12-07 LAB — COMPREHENSIVE METABOLIC PANEL
ALT: 123 U/L — ABNORMAL HIGH (ref 0–44)
AST: 88 U/L — ABNORMAL HIGH (ref 15–41)
Albumin: 2.3 g/dL — ABNORMAL LOW (ref 3.5–5.0)
Alkaline Phosphatase: 313 U/L — ABNORMAL HIGH (ref 38–126)
Anion gap: 12 (ref 5–15)
BUN: 59 mg/dL — ABNORMAL HIGH (ref 8–23)
CO2: 22 mmol/L (ref 22–32)
Calcium: 7.4 mg/dL — ABNORMAL LOW (ref 8.9–10.3)
Chloride: 98 mmol/L (ref 98–111)
Creatinine, Ser: 5.9 mg/dL — ABNORMAL HIGH (ref 0.61–1.24)
GFR calc Af Amer: 11 mL/min — ABNORMAL LOW (ref >60)
GFR calc non Af Amer: 9 mL/min — ABNORMAL LOW (ref >60)
Glucose, Bld: 157 mg/dL — ABNORMAL HIGH (ref 70–99)
Potassium: 4.4 mmol/L (ref 3.5–5.1)
Sodium: 132 mmol/L — ABNORMAL LOW (ref 135–145)
Total Bilirubin: 1.1 mg/dL (ref 0.3–1.2)
Total Protein: 7 g/dL (ref 6.5–8.1)

## 2019-12-07 LAB — GLUCOSE, CAPILLARY
Glucose-Capillary: 104 mg/dL — ABNORMAL HIGH (ref 70–99)
Glucose-Capillary: 113 mg/dL — ABNORMAL HIGH (ref 70–99)
Glucose-Capillary: 132 mg/dL — ABNORMAL HIGH (ref 70–99)
Glucose-Capillary: 140 mg/dL — ABNORMAL HIGH (ref 70–99)
Glucose-Capillary: 148 mg/dL — ABNORMAL HIGH (ref 70–99)
Glucose-Capillary: 79 mg/dL (ref 70–99)

## 2019-12-07 LAB — COOXEMETRY PANEL
Carboxyhemoglobin: 1.9 % — ABNORMAL HIGH (ref 0.5–1.5)
Methemoglobin: 1.1 % (ref 0.0–1.5)
O2 Saturation: 60.7 %
Total hemoglobin: 12.4 g/dL (ref 12.0–16.0)

## 2019-12-07 LAB — CBC
HCT: 24 % — ABNORMAL LOW (ref 39.0–52.0)
Hemoglobin: 7.4 g/dL — ABNORMAL LOW (ref 13.0–17.0)
MCH: 28.5 pg (ref 26.0–34.0)
MCHC: 30.8 g/dL (ref 30.0–36.0)
MCV: 92.3 fL (ref 80.0–100.0)
Platelets: 102 10*3/uL — ABNORMAL LOW (ref 150–400)
RBC: 2.6 MIL/uL — ABNORMAL LOW (ref 4.22–5.81)
RDW: 19.4 % — ABNORMAL HIGH (ref 11.5–15.5)
WBC: 9.8 10*3/uL (ref 4.0–10.5)
nRBC: 0.2 % (ref 0.0–0.2)

## 2019-12-07 MED ORDER — ALBUMIN HUMAN 25 % IV SOLN
25.0000 g | Freq: Once | INTRAVENOUS | Status: AC
  Administered 2019-12-07: 25 g via INTRAVENOUS
  Filled 2019-12-07: qty 100

## 2019-12-07 MED ORDER — PANTOPRAZOLE SODIUM 40 MG PO PACK
40.0000 mg | PACK | Freq: Every day | ORAL | Status: DC
  Administered 2019-12-07 – 2019-12-11 (×5): 40 mg
  Filled 2019-12-07 (×5): qty 20

## 2019-12-07 MED ORDER — AMIODARONE HCL 200 MG PO TABS
200.0000 mg | ORAL_TABLET | Freq: Two times a day (BID) | ORAL | Status: DC
  Administered 2019-12-07 – 2019-12-13 (×13): 200 mg
  Filled 2019-12-07 (×12): qty 1

## 2019-12-07 MED ORDER — MIDODRINE HCL 5 MG PO TABS
20.0000 mg | ORAL_TABLET | Freq: Three times a day (TID) | ORAL | Status: DC
  Administered 2019-12-07 – 2019-12-13 (×18): 20 mg
  Filled 2019-12-07 (×18): qty 4

## 2019-12-07 MED ORDER — APIXABAN 5 MG PO TABS
5.0000 mg | ORAL_TABLET | Freq: Two times a day (BID) | ORAL | Status: DC
  Administered 2019-12-07 – 2019-12-13 (×13): 5 mg
  Filled 2019-12-07 (×12): qty 1

## 2019-12-07 MED ORDER — ALBUMIN HUMAN 25 % IV SOLN
25.0000 g | Freq: Once | INTRAVENOUS | Status: AC
  Administered 2019-12-07: 22:00:00 25 g via INTRAVENOUS
  Filled 2019-12-07: qty 100

## 2019-12-07 MED ORDER — ASPIRIN 81 MG PO CHEW
81.0000 mg | CHEWABLE_TABLET | Freq: Every day | ORAL | Status: DC
  Administered 2019-12-07 – 2019-12-13 (×7): 81 mg
  Filled 2019-12-07 (×7): qty 1

## 2019-12-07 MED ORDER — OXYCODONE HCL 5 MG PO TABS
5.0000 mg | ORAL_TABLET | ORAL | Status: DC | PRN
  Administered 2019-12-09: 10 mg
  Administered 2019-12-12: 5 mg
  Filled 2019-12-07: qty 1
  Filled 2019-12-07: qty 2

## 2019-12-07 MED ORDER — ENSURE ENLIVE PO LIQD
237.0000 mL | Freq: Three times a day (TID) | ORAL | Status: DC
  Administered 2019-12-07 – 2019-12-11 (×9): 237 mL

## 2019-12-07 MED ORDER — ROSUVASTATIN CALCIUM 5 MG PO TABS
10.0000 mg | ORAL_TABLET | Freq: Every day | ORAL | Status: DC
  Administered 2019-12-07 – 2019-12-12 (×6): 10 mg
  Filled 2019-12-07 (×6): qty 2

## 2019-12-07 MED ORDER — CLOPIDOGREL BISULFATE 75 MG PO TABS
75.0000 mg | ORAL_TABLET | Freq: Every day | ORAL | Status: DC
  Administered 2019-12-07 – 2019-12-09 (×3): 75 mg
  Filled 2019-12-07 (×2): qty 1

## 2019-12-07 MED ORDER — ASCORBIC ACID 500 MG PO TABS
500.0000 mg | ORAL_TABLET | Freq: Two times a day (BID) | ORAL | Status: DC
  Administered 2019-12-07 – 2019-12-13 (×13): 500 mg
  Filled 2019-12-07 (×12): qty 1

## 2019-12-07 NOTE — Consult Note (Signed)
[place] Nurse wound follow up Patient receiving care in [facility] 2H4.  Spouse at bedside. Wound type: sternal surgical wound Measurement: deferred Wound bed: 98% pink, remainder is yellow Drainage (amount, consistency, odor) serous in tubing Periwound: intact Dressing procedure/placement/frequency: one piece of black foam removed, one piece placed, immediate seal obtained.  I let primary RN Jeanine Luz know that the patient felt warm, I took an oral temp, it was 99.8 and checked an axillary temp, it was 100.0; and, that he is tachypneic. Dressing for Friday on shelf in room. Val Riles, RN, MSN, CWOCN, CNS-BC, pager [phone]

## 2019-12-07 NOTE — Progress Notes (Addendum)
[name] PROGRESS NOTE                   [address]            [place] [address]          [phone]   8 Days Post-Op Procedure(s) (LRB): STERNAL WOUND DEBRIDEMENT (N/A) APPLICATION OF WOUND VAC (N/A)  Total Length of Stay:  LOS: 44 days   Subjective:  Patient is short of breath.  Requesting a breathing treatment.  Also ask if he will get dialysis today.  Objective: Vital signs in last 24 hours: Temp:  [97.4 F (36.3 C)-99.9 F (37.7 C)] 98.5 F (36.9 C) (05/11 2000) Pulse Rate:  [78-106] 95 (05/12 0700) Cardiac Rhythm: Atrial fibrillation (05/12 0400) Resp:  [12-38] 31 (05/12 0608) BP: (75-151)/(47-116) 106/87 (05/12 0700) SpO2:  [87 %-100 %] 88 % (05/12 0700) Weight:  [96.3 kg] 96.3 kg (05/11 1538)  Filed Weights   12/05/19 1330 12/05/19 1804 12/06/19 1538  Weight: 91.6 kg 94.1 kg 96.3 kg    Weight change: 4.7 kg   Intake/Output from previous day: 05/11 0701 - 05/12 0700 In: 2468.1 [P.O.:410; Blood:729; NG/GT:690; IV Piggyback:639.1] Out: 0814 [Drains:50]  Current Meds: Scheduled Meds: . sodium chloride   Intravenous Once  . amiodarone  200 mg Oral BID  . apixaban  5 mg Oral BID  . vitamin C  500 mg Oral BID  . aspirin EC  81 mg Oral [name]  . Chlorhexidine Gluconate Cloth  6 each Topical [name]  . Chlorhexidine Gluconate Cloth  6 each Topical Q0600  . clopidogrel  75 mg Oral [name]  . [START ON 12/09/2019] darbepoetin (ARANESP) injection - DIALYSIS  200 mcg Intravenous Q Fri-HD  . feeding supplement (ENSURE ENLIVE)  237 mL Oral TID BM  . feeding supplement (PRO-STAT SUGAR FREE 64)  30 mL Per Tube QID  . insulin aspart  0-9 Units Subcutaneous Q4H  . insulin glargine  6 Units Subcutaneous [name]  . midodrine  20 mg Oral TID WC  . multivitamin  1 tablet Oral QHS  . pantoprazole  40 mg Oral [name]  . rosuvastatin  10 mg Oral q1800  . sodium chloride flush  10-40 mL Intracatheter Q12H   Continuous Infusions: . sodium chloride Stopped  (11/21/19 1749)  . sodium chloride    . sodium chloride    . sodium chloride Stopped (12/05/19 0144)  . feeding supplement (VITAL 1.5 CAL) 1,000 mL (12/07/19 0755)  . linezolid (ZYVOX) IV Stopped (12/06/19 2304)  . norepinephrine (LEVOPHED) Adult infusion Stopped (11/30/19 1629)   PRN Meds:.sodium chloride, Place/Maintain arterial line **AND** sodium chloride, Place/Maintain arterial line **AND** sodium chloride, acetaminophen (TYLENOL) oral liquid 160 mg/5 mL, dextrose, diphenhydrAMINE, fentaNYL (SUBLIMAZE) injection, Etoy Mcdonnell's butt cream, heparin, heparin, heparin, levalbuterol, lip balm, metoprolol tartrate, ondansetron (ZOFRAN) IV, oxyCODONE, phenol, sodium chloride flush  General appearance: alert, cooperative and mild distress Heart: regular rate and rhythm Lungs: wheezes bilaterally Abdomen: soft, non-tender; bowel sounds normal; no masses,  no organomegaly Extremities: edema trace Wound: wound vac in place on sternotomy  Lab Results: CBC: Recent Labs    12/06/19 0414 12/07/19 0320  WBC 9.0 9.8  HGB 6.3* 7.4*  HCT 21.1* 24.0*  PLT 130* 102*   BMET:  Recent Labs    12/05/19 0309 12/07/19 0320  NA 135 132*  K 5.3* 4.4  CL 99 98  CO2 21* 22  GLUCOSE 124* 157*  BUN 81* 59*  CREATININE 6.71* 5.90*  CALCIUM 6.6* 7.4*    CMET: Lab Results  Component Value Date   WBC 9.8 12/07/2019   HGB 7.4 (L) 12/07/2019   HCT 24.0 (L) 12/07/2019   PLT 102 (L) 12/07/2019   GLUCOSE 157 (H) 12/07/2019   CHOL 122 02/15/2019   TRIG 85 02/15/2019   HDL 46 02/15/2019   LDLCALC 59 02/15/2019   ALT 123 (H) 12/07/2019   AST 88 (H) 12/07/2019   NA 132 (L) 12/07/2019   K 4.4 12/07/2019   CL 98 12/07/2019   CREATININE 5.90 (H) 12/07/2019   BUN 59 (H) 12/07/2019   CO2 22 12/07/2019   TSH 5.113 (H) 05/25/2016   INR 2.6 (H) 11/18/2019   HGBA1C 6.2 (H) 10/27/2019      PT/INR: No results for input(s): LABPROT, INR in the last 72 hours. Radiology: [facility] Chest Port 1 View  Result  Date: 12/07/2019 CLINICAL DATA:  CABG EXAM: PORTABLE CHEST 1 VIEW COMPARISON:  Three days ago FINDINGS: Stable cardiomegaly. Aortic valve replacement and CABG. Bilateral central line with left-sided dialysis catheter tip at the right atrium, unchanged. Indistinct opacity with small pleural effusions. No septal thickening or pneumothorax IMPRESSION: Small pleural effusions and presumed atelectasis. There may also be vascular congestion. Aeration is stable or mildly worsened from 3 days ago. Electronically Signed   By: Monte Fantasia M.D.   On: 12/07/2019 07:16   Assessment/Plan: S/P Procedure(s) (LRB): STERNAL WOUND DEBRIDEMENT (N/A) APPLICATION OF WOUND VAC (N/A)  1. CV- NSR, BP in the 120s- on Midodrine, Amiodarone 2. Pulm- off BIPAP, visibly short of breath this morning, sats are okay.. requesting breathing treatment today 3. Renal- ESRD, on dialysis per Nephrology.. told patient they would determine if he needs dialysis today 4. ID- VRE in sternal incision, for wound vac change today 5. Anemia of chronic disease, Hgb up to 7.4 after transfusion of blood yesterday 6. Deconditioned- for LTAC, insurance authorization in process 7. Dispo- patient stable, continue current care, for wound vac change today, possible dialysis if nephrology feels warranted,      Ellwood Handler, PA-C  12/07/2019 8:08 AM   Was on bipap yesterday , still sob but better then yesterday  Waiting for ltac placement  I have seen and examined Patrick [name] and agree with the above assessment  and plan.  Grace Isaac MD Beeper [phone] Office [phone] 12/07/2019 9:10 AM

## 2019-12-07 NOTE — Progress Notes (Signed)
[facility] Progress Note   Assessment/ Plan:   OP HD: [name] MWF   4h 400/800   99.5kg   2K/3.5Ca   TDC   Heparin 3000 -Mircera 75 (last 3/22)  -Hectorol 1    Assessment/ Plan: 1.  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 . Off CRRT tol iHD but limited UF. Had HD 5/10, sequential 5/11., and HD scheduled for today 5/12.  He is requiring midodrine and still has significant IDH.  Obligate intake is greater than output.  Going for healthy UF goal today.  If we don't get good fluid off today, I'm concerned he will become more and more fluid overloaded.  Discussed this with pt and wife.  Going back on pressors or CRRT would be a step in the wrong direction but we may end up having to do one or the other.    2. ESRD: usual HD is MWF. Postop required CRRT due to hemodynamic instability.   normally MWF schedule,  3. BP/ volume: BP's 90's- 100's, better. On midodrine 4.  Anemia ckd - Hb 7s. Getting ESA darbe 100ug qFri. CTM, increased to 200 mcg for Friday.  Transfusion prn. 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 Nutrition: On dysphagia 3 diet with aspiration precautions based on results of swallow test.  Push protein 7.  Atrial fibrillation: With controlled ventricular response 8.   Dispo - for LTACH  Subjective:    Seen in room. Only 1.5 off with sequential yesterday.  Still tachypneic   Objective:   BP 112/68   Pulse (!) 106   Temp 99.8 F (37.7 C) (Oral)   Resp (!) 30   Ht 5' 6.5" (1.689 m)   Wt 96.3 kg   SpO2 97%   BMI 33.75 kg/m   Physical Exam: Gen: sitting in bed, eating CVS: RRR no m/r/g Resp: speaking in short sentences, muffled at bases, tachypneic Abd: + NABS Ext: no LE edema, 2+ upper extremity edema ACCESS: L IJ TDC  Labs: BMET Recent Labs  Lab 12/01/19 0320 12/02/19 0405 12/03/19 1043 12/04/19 0340  12/05/19 0309 12/07/19 0320  NA 133* 132* 134* 133* 135 132*  K 4.1 3.7 4.4 5.0 5.3* 4.4  CL 98 99 100 98 99 98  CO2 21* _0 21* 22  GLUCOSE 184* 137* 151* 150* 124* 157*  BUN 71* 47* 44* 60* 81* 59*  CREATININE 7.46* 4.63* 4.30* 5.35* 6.71* 5.90*  CALCIUM 5.8* 5.8* 6.2* 7.1* 6.6* 7.4*  PHOS  --   --  3.0  --   --   --    CBC Recent Labs  Lab 12/04/19 0340 12/05/19 0309 12/06/19 0414 12/07/19 0320  WBC 13.8* 12.9* 9.0 9.8  HGB 7.5* 7.3* 6.3* 7.4*  HCT 25.2* 24.3* 21.1* 24.0*  MCV 94.7 94.2 95.0 92.3  PLT 154 137* 130* 102*      Medications:    . sodium chloride   Intravenous Once  . amiodarone  200 mg Per Tube BID  . apixaban  5 mg Per Tube BID  . vitamin C  500 mg Per Tube BID  . aspirin  81 mg Per Tube Daily  . Chlorhexidine Gluconate Cloth  6 each Topical Daily  . Chlorhexidine Gluconate Cloth  6 each Topical Q0600  . clopidogrel  75 mg Per Tube Daily  . [START ON 12/09/2019] darbepoetin (ARANESP) injection -  DIALYSIS  200 mcg Intravenous Q Fri-HD  . feeding supplement (ENSURE ENLIVE)  237 mL Per Tube TID BM  . feeding supplement (PRO-STAT SUGAR FREE 64)  30 mL Per Tube QID  . insulin aspart  0-9 Units Subcutaneous Q4H  . insulin glargine  6 Units Subcutaneous Daily  . midodrine  20 mg Per Tube TID WC  . multivitamin  1 tablet Oral QHS  . pantoprazole sodium  40 mg Per Tube Daily  . rosuvastatin  10 mg Per Tube q1800  . sodium chloride flush  10-40 mL Intracatheter Q12H     Madelon Lips, MD [facility] pgr [phone] 12/07/2019, 10:02 AM

## 2019-12-07 NOTE — Progress Notes (Signed)
Chaplain engaged in follow-up visit with Patrick [name].  She was teary-eyed as she discussed how Mr. Ahart is feeling today.  She described today as a "down" day.  Chaplain offered presence and empathic listening.  Mrs. Vanderschaaf noted that Mr. Biel did not want to eat today because of how laboring it can be on his breathing.  Chaplain affirmed all that Mr. Guzzetta has endured.  Chaplain offered prayer over Mr. And Mrs. Waterson.  Chaplain will continue to follow-up.

## 2019-12-07 NOTE — Progress Notes (Signed)
Patient ID: Patrick [name], male   DOB: 11-19-1955, 64 y.o.   MRN: [id number] EVENING ROUNDS NOTE :     [address]       ,[place] [address]             [phone]                 8 Days Post-Op Procedure(s) (LRB): STERNAL WOUND DEBRIDEMENT (N/A) APPLICATION OF WOUND VAC (N/A)  Total Length of Stay:  LOS: 44 days  BP (!) 81/47   Pulse 88   Temp 99.1 F (37.3 C) (Oral)   Resp (!) 23   Ht 5' 6.5" (1.689 m)   Wt 96.3 kg   SpO2 100%   BMI 33.75 kg/m   .Intake/Output      05/11 0701 - 05/12 0700 05/12 0701 - 05/13 0700   P.O. 410    I.V. (mL/kg)     Blood 729    Other     NG/GT 690 180   IV Piggyback 639.1    Total Intake(mL/kg) 2468.1 (25.6) 180 (1.9)   Emesis/NG output  90   Drains 50    Other 1500    Stool 0 1   Total Output 1550 91   Net +918.1 +89        Stool Occurrence 6 x 1 x   Emesis Occurrence  1 x     . sodium chloride Stopped (11/21/19 1749)  . sodium chloride    . sodium chloride    . sodium chloride Stopped (12/05/19 0144)  . feeding supplement (VITAL 1.5 CAL) 1,000 mL (12/07/19 0755)  . linezolid (ZYVOX) IV 600 mg (12/07/19 1005)  . norepinephrine (LEVOPHED) Adult infusion Stopped (11/30/19 1629)     Lab Results  Component Value Date   WBC 9.8 12/07/2019   HGB 7.4 (L) 12/07/2019   HCT 24.0 (L) 12/07/2019   PLT 102 (L) 12/07/2019   GLUCOSE 157 (H) 12/07/2019   CHOL 122 02/15/2019   TRIG 85 02/15/2019   HDL 46 02/15/2019   LDLCALC 59 02/15/2019   ALT 123 (H) 12/07/2019   AST 88 (H) 12/07/2019   NA 132 (L) 12/07/2019   K 4.4 12/07/2019   CL 98 12/07/2019   CREATININE 5.90 (H) 12/07/2019   BUN 59 (H) 12/07/2019   CO2 22 12/07/2019   TSH 5.113 (H) 05/25/2016   INR 2.6 (H) 11/18/2019   HGBA1C 6.2 (H) 10/27/2019   More sob today, still waiting for dialysis Ate all on his plate  D/c tube feeding for noe monitor po intake if continues d/c feeding tube    Grace Isaac MD  Beeper [phone] Office  [phone] 12/07/2019 5:41 PM  \

## 2019-12-08 LAB — COMPREHENSIVE METABOLIC PANEL
ALT: 93 U/L — ABNORMAL HIGH (ref 0–44)
AST: 68 U/L — ABNORMAL HIGH (ref 15–41)
Albumin: 2.7 g/dL — ABNORMAL LOW (ref 3.5–5.0)
Alkaline Phosphatase: 247 U/L — ABNORMAL HIGH (ref 38–126)
Anion gap: 10 (ref 5–15)
BUN: 27 mg/dL — ABNORMAL HIGH (ref 8–23)
CO2: 26 mmol/L (ref 22–32)
Calcium: 8.9 mg/dL (ref 8.9–10.3)
Chloride: 98 mmol/L (ref 98–111)
Creatinine, Ser: 3.81 mg/dL — ABNORMAL HIGH (ref 0.61–1.24)
GFR calc Af Amer: 18 mL/min — ABNORMAL LOW (ref >60)
GFR calc non Af Amer: 16 mL/min — ABNORMAL LOW (ref >60)
Glucose, Bld: 82 mg/dL (ref 70–99)
Potassium: 3.6 mmol/L (ref 3.5–5.1)
Sodium: 134 mmol/L — ABNORMAL LOW (ref 135–145)
Total Bilirubin: 1.2 mg/dL (ref 0.3–1.2)
Total Protein: 6.5 g/dL (ref 6.5–8.1)

## 2019-12-08 LAB — IRON AND TIBC
Iron: 80 ug/dL (ref 45–182)
Saturation Ratios: 46 % — ABNORMAL HIGH (ref 17.9–39.5)
TIBC: 172 ug/dL — ABNORMAL LOW (ref 250–450)
UIBC: 92 ug/dL

## 2019-12-08 LAB — CBC
HCT: 21.1 % — ABNORMAL LOW (ref 39.0–52.0)
Hemoglobin: 6.4 g/dL — CL (ref 13.0–17.0)
MCH: 28.7 pg (ref 26.0–34.0)
MCHC: 30.3 g/dL (ref 30.0–36.0)
MCV: 94.6 fL (ref 80.0–100.0)
Platelets: 91 10*3/uL — ABNORMAL LOW (ref 150–400)
RBC: 2.23 MIL/uL — ABNORMAL LOW (ref 4.22–5.81)
RDW: 19.6 % — ABNORMAL HIGH (ref 11.5–15.5)
WBC: 6.9 10*3/uL (ref 4.0–10.5)
nRBC: 0 % (ref 0.0–0.2)

## 2019-12-08 LAB — COOXEMETRY PANEL
Carboxyhemoglobin: 2.7 % — ABNORMAL HIGH (ref 0.5–1.5)
Methemoglobin: 1.3 % (ref 0.0–1.5)
O2 Saturation: 83 %
Total hemoglobin: 6.4 g/dL — CL (ref 12.0–16.0)

## 2019-12-08 LAB — PREPARE RBC (CROSSMATCH)

## 2019-12-08 LAB — GLUCOSE, CAPILLARY
Glucose-Capillary: 103 mg/dL — ABNORMAL HIGH (ref 70–99)
Glucose-Capillary: 104 mg/dL — ABNORMAL HIGH (ref 70–99)
Glucose-Capillary: 112 mg/dL — ABNORMAL HIGH (ref 70–99)
Glucose-Capillary: 116 mg/dL — ABNORMAL HIGH (ref 70–99)
Glucose-Capillary: 117 mg/dL — ABNORMAL HIGH (ref 70–99)
Glucose-Capillary: 76 mg/dL (ref 70–99)
Glucose-Capillary: 76 mg/dL (ref 70–99)

## 2019-12-08 LAB — FERRITIN: Ferritin: 1298 ng/mL — ABNORMAL HIGH (ref 24–336)

## 2019-12-08 MED ORDER — LEVALBUTEROL HCL 1.25 MG/0.5ML IN NEBU
1.2500 mg | INHALATION_SOLUTION | Freq: Three times a day (TID) | RESPIRATORY_TRACT | Status: DC
  Administered 2019-12-08 – 2019-12-11 (×8): 1.25 mg via RESPIRATORY_TRACT
  Filled 2019-12-08 (×8): qty 0.5

## 2019-12-08 MED ORDER — SODIUM CHLORIDE 0.9% IV SOLUTION: Freq: Once | INTRAVENOUS | Status: DC

## 2019-12-08 NOTE — Progress Notes (Addendum)
[facility] Progress Note   Assessment/ Plan:   OP HD: [name] MWF   4h 400/800   99.5kg   2K/3.5Ca   TDC   Heparin 3000 -Mircera 75 (last 3/22)  -Hectorol 1    Assessment/ Plan: 1.  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 . Off CRRT tol iHD but limited UF. Had HD 5/10, sequential 5/11., s/p HD 5/12.  He is requiring midodrine and still has significant IDH.  Fortunately we made a dent in his vol status yesterday, will plan for HD tomorrow if still here with same UF goal.     2. ESRD: usual HD is MWF. Postop required CRRT due to hemodynamic instability.  Now back on MWF  3. BP/ volume: BP's 90's- 100's, better. On midodrine 4.  Anemia ckd - Hb 7s. Getting ESA darbe 100ug qFri. CTM, increased to 200 mcg for this coming Friday.  Transfusion prn.  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 Nutrition: On dysphagia 3 diet with aspiration precautions based on results of swallow test.  Push protein 7.  Atrial fibrillation: With controlled ventricular response 8.   Dispo - for LTACH  Subjective:    HD yesterday with 3L off.  Much more comfortably breathing this AM.  TF stopped in effort to get pt to consume more PO.  Possible LTAC soon   Objective:   BP (!) 96/56   Pulse 96   Temp 98.9 F (37.2 C) (Oral)   Resp (!) 27   Ht 5' 6.5" (1.689 m)   Wt 89.2 kg   SpO2 100%   BMI 31.26 kg/m   Physical Exam: Gen: sitting in bed, appears more comfortable CVS: RRR no m/r/g Resp: WOB better, full sentences unlabored today Abd: + NABS Ext: trace LE edema,  ACCESS: L IJ TDC  Labs: BMET Recent Labs  Lab 12/02/19 0405 12/03/19 1043 12/04/19 0340 12/05/19 0309 12/07/19 0320 12/08/19 0425  NA 132* 134* 133* 135 132* 134*  K 3.7 4.4 5.0 5.3* 4.4 3.6  CL 99 100 98 99 98 98  CO2 24 26 23  21* 22 26  GLUCOSE 137*  151* 150* 124* 157* 82  BUN 47* 44* 60* 81* 59* 27*  CREATININE 4.63* 4.30* 5.35* 6.71* 5.90* 3.81*  CALCIUM 5.8* 6.2* 7.1* 6.6* 7.4* 8.9  PHOS  --  3.0  --   --   --   --    CBC Recent Labs  Lab 12/05/19 0309 12/06/19 0414 12/07/19 0320 12/08/19 0425  WBC 12.9* 9.0 9.8 6.9  HGB 7.3* 6.3* 7.4* 6.4*  HCT 24.3* 21.1* 24.0* 21.1*  MCV 94.2 95.0 92.3 94.6  PLT 137* 130* 102* 91*      Medications:    . sodium chloride   Intravenous Once  . sodium chloride   Intravenous Once  . amiodarone  200 mg Per Tube BID  . apixaban  5 mg Per Tube BID  . vitamin C  500 mg Per Tube BID  . aspirin  81 mg Per Tube Daily  . Chlorhexidine Gluconate Cloth  6 each Topical Daily  . Chlorhexidine Gluconate Cloth  6 each Topical Q0600  . clopidogrel  75 mg Per Tube Daily  . [START ON 12/09/2019] darbepoetin (ARANESP) injection - DIALYSIS  200 mcg Intravenous Q Fri-HD  . feeding supplement (ENSURE ENLIVE)  237 mL Per Tube  TID BM  . feeding supplement (PRO-STAT SUGAR FREE 64)  30 mL Per Tube QID  . insulin aspart  0-9 Units Subcutaneous Q4H  . insulin glargine  6 Units Subcutaneous Daily  . midodrine  20 mg Per Tube TID WC  . multivitamin  1 tablet Oral QHS  . pantoprazole sodium  40 mg Per Tube Daily  . rosuvastatin  10 mg Per Tube q1800  . sodium chloride flush  10-40 mL Intracatheter Q12H     Madelon Lips, MD [facility] pgr [phone] 12/08/2019, 9:25 AM

## 2019-12-08 NOTE — Progress Notes (Signed)
CRITICAL VALUE ALERT  Critical Value:  Hemoglobin 6.4  Date & Time Notied: 12/08/19 0550  Provider Notified: Dr. Servando Snare  Orders Received/Actions taken:  Transfuse 1 unit PRBC

## 2019-12-08 NOTE — Progress Notes (Signed)
Dr. Servando Snare was made aware of the patient having a temp of 101.1 prior to starting blood transfusion. Dr. Servando Snare stated that it was okay to give 1 unit PRBC.

## 2019-12-08 NOTE — Progress Notes (Signed)
9 Days Post-Op Procedure(s) (LRB): STERNAL WOUND DEBRIDEMENT (N/A) APPLICATION OF WOUND VAC (N/A) Subjective: Wheezing with sats 100% Stood w/ assist today Wound VAC chg M W F Objective: Vital signs in last 24 hours: Temp:  [98 F (36.7 C)-102.4 F (39.1 C)] 98 F (36.7 C) (05/13 1141) Pulse Rate:  [85-111] 97 (05/13 1700) Cardiac Rhythm: Normal sinus rhythm (05/13 1200) Resp:  [16-34] 21 (05/13 1700) BP: (68-140)/(40-84) 113/61 (05/13 1700) SpO2:  [90 %-100 %] 99 % (05/13 1700) Weight:  [89.2 kg] 89.2 kg (05/12 2346)  Hemodynamic parameters for last 24 hours:    Intake/Output from previous day: 05/12 0701 - 05/13 0700 In: 780 [NG/GT:180; IV Piggyback:600] Out: 3091 [Emesis/NG output:90; Stool:1] Intake/Output this shift: No intake/output data recorded. nsr Alert, oriented   Lab Results: Recent Labs    12/07/19 0320 12/08/19 0425  WBC 9.8 6.9  HGB 7.4* 6.4*  HCT 24.0* 21.1*  PLT 102* 91*   BMET:  Recent Labs    12/07/19 0320 12/08/19 0425  NA 132* 134*  K 4.4 3.6  CL 98 98  CO2 22 26  GLUCOSE 157* 82  BUN 59* 27*  CREATININE 5.90* 3.81*  CALCIUM 7.4* 8.9    PT/INR: No results for input(s): LABPROT, INR in the last 72 hours. ABG    Component Value Date/Time   PHART 7.375 11/20/2019 0626   HCO3 23.4 11/20/2019 0626   TCO2 25 11/20/2019 0626   ACIDBASEDEF 2.0 11/20/2019 0626   O2SAT 83.0 12/08/2019 0425   CBG (last 3)  Recent Labs    12/08/19 0745 12/08/19 1133 12/08/19 1539  GLUCAP 117* 116* 103*    Assessment/Plan: S/P Procedure(s) (LRB): STERNAL WOUND DEBRIDEMENT (N/A) APPLICATION OF WOUND VAC (N/A) HD and VAC Chg tomorrow  CXR in am   LOS: 45 days    Patrick [name] 12/08/2019

## 2019-12-08 NOTE — Progress Notes (Signed)
PT Cancellation Note  Patient Details Name: Patrick [name] MRN: [id number] DOB: March 09, 1956   Cancelled Treatment:    Reason Eval/Treat Not Completed: Medical issues which prohibited therapy(pt supine having received blood and midodrine with BP 80/47 MAP 58 will await rise in BP prior to mobility)   Licet Dunphy B Faaris Arizpe 12/08/2019, 9:31 AM  Bayard Males, PT Acute Rehabilitation Services Pager: [phone] Office: [phone]

## 2019-12-08 NOTE — Progress Notes (Signed)
Physical Therapy Treatment Patient Details Name: Patrick [name] MRN: [id number] DOB: 09-May-1956 Today's Date: 12/08/2019    History of Present Illness Pt is a 64 yo male s/p re-do CABGx3 with Aortic valve replacement. Pt has been on CRRT but removed 4/11 due to hypotension and VTach with return to CRRT 4/12 pt with ileus. 4/23  While patient undergoing gated CT (to further evaluate pericardium) he had a PEA arrest. CPR done for approximately 3 minutes with ROSC and patient intubated. S/p sternal debridement with placement of wound vac 11/29/19. PMHx: ESRD MWF, HTN, PNA, T2DM, MI.    PT Comments    Pt pleasant and willing to participate today. Wife took shoes home so stood in socks today with pt reporting burning bil foot pain limiting tolerance for standing. Pt able to perform 2 standing trials and HEp prior to fatigue and return to bed. Pt encouraged to continue to increase mobility.  BP supine 106/56 (67) Sitting 105/92 (95) After standing 87/51 (62)  SpO2 100% on 3L    Follow Up Recommendations  LTACH;Supervision/Assistance - 24 hour     Equipment Recommendations  None recommended by PT    Recommendations for Other Services       Precautions / Restrictions Precautions Precautions: Fall;Sternal;Other (comment) Precaution Comments: sternal wound vac Restrictions RUE Weight Bearing: Partial weight bearing RUE Partial Weight Bearing Percentage or Pounds: Sternal Precautions LUE Weight Bearing: Partial weight bearing LUE Partial Weight Bearing Percentage or Pounds: Sternal Precautions    Mobility  Bed Mobility Overal bed mobility: Needs Assistance Bed Mobility: Supine to Sit;Sit to Supine           General bed mobility comments: utilized foot egress to transition from supine to sitting, assist to position trunk in midline tendency for left lean, mod assist to scoot to edge  Transfers Overall transfer level: Needs assistance   Transfers: Sit to/from Stand Sit to Stand:  Mod assist         General transfer comment: mod assist with bil knees blocked and use of pad under sacrum with elevated surface. pt stood 2 trials grossly 5-10 sec each  Ambulation/Gait             General Gait Details: unable   Stairs             Wheelchair Mobility    Modified Rankin (Stroke Patients Only)       Balance Overall balance assessment: Needs assistance   Sitting balance-Leahy Scale: Poor     Standing balance support: Bilateral upper extremity supported Standing balance-Leahy Scale: Poor                              Cognition Arousal/Alertness: Awake/alert Behavior During Therapy: WFL for tasks assessed/performed Overall Cognitive Status: Impaired/Different from baseline Area of Impairment: Memory                     Memory: Decreased recall of precautions                Exercises General Exercises - Lower Extremity Long Arc Quad: Both;Seated;15 reps;AROM Hip Flexion/Marching: Supine;AAROM;15 reps;Both    General Comments        Pertinent Vitals/Pain Pain Score: 4  Pain Location: bil feet Pain Descriptors / Indicators: Discomfort;Grimacing;Guarding;Sore Pain Intervention(s): Limited activity within patient's tolerance;Monitored during session;Repositioned    Home Living  Prior Function            PT Goals (current goals can now be found in the care plan section) Progress towards PT goals: Progressing toward goals    Frequency    Min 2X/week      PT Plan Current plan remains appropriate    Co-evaluation              AM-PAC PT "6 Clicks" Mobility   Outcome Measure  Help needed turning from your back to your side while in a flat bed without using bedrails?: A Lot Help needed moving from lying on your back to sitting on the side of a flat bed without using bedrails?: A Lot Help needed moving to and from a bed to a chair (including a wheelchair)?:  Total Help needed standing up from a chair using your arms (e.g., wheelchair or bedside chair)?: A Lot Help needed to walk in hospital room?: Total Help needed climbing 3-5 steps with a railing? : Total 6 Click Score: 9    End of Session Equipment Utilized During Treatment: Oxygen Activity Tolerance: Patient tolerated treatment well Patient left: in bed;with call bell/phone within reach;with family/visitor present Nurse Communication: Mobility status;Need for lift equipment PT Visit Diagnosis: Muscle weakness (generalized) (M62.81);Other abnormalities of gait and mobility (R26.89)     Time: 1219-7588 PT Time Calculation (min) (ACUTE ONLY): 21 min  Charges:  $Therapeutic Activity: 8-22 mins                     Nihar Klus P, PT Acute Rehabilitation Services Pager: [phone] Office: [place] 12/08/2019, 1:43 PM

## 2019-12-08 NOTE — TOC Progression Note (Addendum)
Transition of Care [facility]) - Progression Note    Patient Details  Name: Patrick [name] MRN: [id number] Date of Birth: 12-14-1955  [facility] CM/SW [place], RN Phone Number: 12/08/2019, 2:43 PM  Clinical Narrative:    Case management called and left a message with Kathee Delton, [facility] - waiting on insurance authorization.  I spoke to the wife as well to inform her that we were waiting on insurance approval for patient to be able to transfer to [facility].  [facility] is the only available LTAC with bed availability for HD patient's such as [name].  The wife asked that I reach out to [place] called Carinna at [facility] and they do not have available HD beds for LTAC.  [facility] continuing to follow the patient.  12/08/19- Spoke with Kathee Delton, liaison with [facility] Kathie Dike - and we are still waiting for insurance authorization approval.  Dahlia Client states that she will notify me as soon as we are able to get authorization and I will update the family likewise.   Expected Discharge Plan: Long Term Acute Care (LTAC) Barriers to Discharge: Continued Medical Work up  Expected Discharge Plan and Services Expected Discharge Plan: Long Term Acute Care (LTAC)   Discharge Planning Services: CM Consult Post Acute Care Choice: Long Term Acute Care (LTAC) Living arrangements for the past 2 months: Apartment(lives in apartment on second floor - planning to change to 1st floor soon)                   Social Determinants of Health (SDOH) Interventions    Readmission Risk Interventions Readmission Risk Prevention Plan 11/24/2019  Transportation Screening Complete  Medication Review Press photographer) Complete  PCP or Specialist appointment within 3-5 days of discharge Complete  HRI or Home Care Consult Complete  SW Recovery Care/Counseling Consult Complete  Palliative Care Screening Complete  Skilled Nursing Facility Complete   Some recent data might be hidden

## 2019-12-09 ENCOUNTER — Inpatient Hospital Stay (HOSPITAL_COMMUNITY): Payer: Medicare HMO

## 2019-12-09 LAB — COMPREHENSIVE METABOLIC PANEL
ALT: 95 U/L — ABNORMAL HIGH (ref 0–44)
AST: 73 U/L — ABNORMAL HIGH (ref 15–41)
Albumin: 2.4 g/dL — ABNORMAL LOW (ref 3.5–5.0)
Alkaline Phosphatase: 237 U/L — ABNORMAL HIGH (ref 38–126)
Anion gap: 10 (ref 5–15)
BUN: 57 mg/dL — ABNORMAL HIGH (ref 8–23)
CO2: 25 mmol/L (ref 22–32)
Calcium: 7.8 mg/dL — ABNORMAL LOW (ref 8.9–10.3)
Chloride: 98 mmol/L (ref 98–111)
Creatinine, Ser: 6.04 mg/dL — ABNORMAL HIGH (ref 0.61–1.24)
GFR calc Af Amer: 10 mL/min — ABNORMAL LOW (ref >60)
GFR calc non Af Amer: 9 mL/min — ABNORMAL LOW (ref >60)
Glucose, Bld: 98 mg/dL (ref 70–99)
Potassium: 4 mmol/L (ref 3.5–5.1)
Sodium: 133 mmol/L — ABNORMAL LOW (ref 135–145)
Total Bilirubin: 1 mg/dL (ref 0.3–1.2)
Total Protein: 6.6 g/dL (ref 6.5–8.1)

## 2019-12-09 LAB — TYPE AND SCREEN
ABO/RH(D): O POS
Antibody Screen: POSITIVE
DAT, IgG: NEGATIVE
Unit division: 0
Unit division: 0

## 2019-12-09 LAB — COOXEMETRY PANEL
Carboxyhemoglobin: 2.2 % — ABNORMAL HIGH (ref 0.5–1.5)
Methemoglobin: 1.1 % (ref 0.0–1.5)
O2 Saturation: 62.4 %
Total hemoglobin: 11.3 g/dL — ABNORMAL LOW (ref 12.0–16.0)

## 2019-12-09 LAB — BPAM RBC
Blood Product Expiration Date: 202106112359
Blood Product Expiration Date: 202106162359
ISSUE DATE / TIME: 202105111828
ISSUE DATE / TIME: 202105130626
Unit Type and Rh: 5100
Unit Type and Rh: 9500

## 2019-12-09 LAB — GLUCOSE, CAPILLARY
Glucose-Capillary: 87 mg/dL (ref 70–99)
Glucose-Capillary: 49 mg/dL — ABNORMAL LOW (ref 70–99)
Glucose-Capillary: 54 mg/dL — ABNORMAL LOW (ref 70–99)
Glucose-Capillary: 85 mg/dL (ref 70–99)
Glucose-Capillary: 128 mg/dL — ABNORMAL HIGH (ref 70–99)
Glucose-Capillary: 111 mg/dL — ABNORMAL HIGH (ref 70–99)
Glucose-Capillary: 100 mg/dL — ABNORMAL HIGH (ref 70–99)

## 2019-12-09 LAB — CBC
HCT: 23.5 % — ABNORMAL LOW (ref 39.0–52.0)
Hemoglobin: 7.2 g/dL — ABNORMAL LOW (ref 13.0–17.0)
MCH: 28.6 pg (ref 26.0–34.0)
MCHC: 30.6 g/dL (ref 30.0–36.0)
MCV: 93.3 fL (ref 80.0–100.0)
Platelets: 90 10*3/uL — ABNORMAL LOW (ref 150–400)
RBC: 2.52 MIL/uL — ABNORMAL LOW (ref 4.22–5.81)
RDW: 18.7 % — ABNORMAL HIGH (ref 11.5–15.5)
WBC: 7.8 10*3/uL (ref 4.0–10.5)
nRBC: 0 % (ref 0.0–0.2)

## 2019-12-09 MED ORDER — INSULIN GLARGINE 100 UNIT/ML ~~LOC~~ SOLN
4.0000 [IU] | Freq: Every day | SUBCUTANEOUS | Status: DC
  Administered 2019-12-10: 4 [IU] via SUBCUTANEOUS
  Filled 2019-12-09 (×2): qty 0.04

## 2019-12-09 MED ORDER — HEPARIN SODIUM (PORCINE) 1000 UNIT/ML IJ SOLN
INTRAMUSCULAR | Status: AC
  Filled 2019-12-09: qty 5

## 2019-12-09 MED ORDER — ALBUMIN HUMAN 25 % IV SOLN
INTRAVENOUS | Status: AC
  Filled 2019-12-09: qty 200

## 2019-12-09 NOTE — Progress Notes (Signed)
[facility] BRIEF SICU PROGRESS NOTE  10 Days Post-Op  S/P Procedure(s) (LRB): STERNAL WOUND DEBRIDEMENT (N/A) APPLICATION OF WOUND VAC (N/A)   Stable day  Plan: Continue current plan  Rexene Alberts, MD 12/09/2019 6:44 PM

## 2019-12-09 NOTE — Progress Notes (Signed)
Nutrition Follow-up  DOCUMENTATION CODES:   Not applicable  INTERVENTION:   If po intake remains improved, recommend removal of Cortrak tube if no plans to use  Snacks BID between meals  Continue Pro-Stat 30 mL TID per tube  Continue Ensure Enlive po TID, each supplement provides 350 kcal and 20 grams of protein. If pt does not want to drink, can be administered via Cortrak. However, needs to be administered SLOWLY as rapid infusion could cause diarrhea  Continue Vit C 500 mg BID x 30 days from start date for Vit C deficiency  Continue Rena-Vit   NUTRITION DIAGNOSIS:   Inadequate oral intake related to acute illness as evidenced by NPO status.  Being addressed via supplements, improved po intake  GOAL:   Patient will meet greater than or equal to 90% of their needs  Progressing  MONITOR:   PO intake, Diet advancement, Supplement acceptance, Labs, Weight trends, Skin  REASON FOR ASSESSMENT:   Ventilator    ASSESSMENT:   64 yo male admitted with NSTEMI and found to have mutlivessel CAD and underwent CABG on 4/1, ESRD/HD requiring CRRT. PMH includes ESRD on HD s/p renal transplant, CAD s/p CABG, CHF, DM  3/29 Admission 3/30 Cardiac Cath 4/01 CABG with IABP placement(removed 4/3), Intubated 4/02 CRRT initiated 4/04 Extubated 4/05 Cortrak placed,tip in stomach;okay to feed gastric per RD discussion with Dr. Orvan Seen 4/07 CRRT discontinued, ileus, NPO, Cortrak pulled out by pt 4/08 CL diet in AM, FL diet in PM 4/09 iHD with 1.3 L removed 4/10 CRRT re-initiated 4/11 Diet advanced to Dysphagia III 4/13 Diet advanced to Heart Healthy, Carb Modified 4/14 Cortrak placed 4/15 TF off due to abdominal pain 4/23 PEA arrest while supine for CT chest, hyperkalemia with K+>7.5, Intubated 4/27 Extubated 4/28 CRRT discontinued, TF held for ileus 5/01 HD 5/04 OR for sternal debridement and application of wound vac; diet advanced to Dysphagia 3 5/06 iHD 5/07 iHD  Pt is  awaiting placement  Pt eating well at meal times per wife. Appears po intake is improving. Pt ate 15% at breakfast yesterday but 100% at lunch and dinner. Pt ate a good breakfast. Wife at bedside and continues to encourage po intake. Pt does not like the Ensure and reports it makes him go to the bathroom but pt continues to take Ensure. Per RN, pt drank one this morning  Noted order has been changed to Ensure per tube; recommend administering slowly if pushed via tube. Rapid administration may cause diarrhea  TF held on 5/12 by MD as pt with improved po intake  Noted hypoglycemic this AM; CBGs 49-54. Noted DM coordinator evaluated and provided recommendations this AM  Labs: sodium 133 (L), CBGs 59-128 Meds: lantus, ss novolog, Rena-Vit, Aranesp  Diet Order:   Diet Order            DIET DYS 3 Room service appropriate? Yes; Fluid consistency: Thin  Diet effective now              EDUCATION NEEDS:   No education needs have been identified at this time  Skin:  Skin Assessment: Skin Integrity Issues: Skin Integrity Issues:: Incisions DTI: R toe Stage II: sacrum Unstageable: R toe, L toe Incisions: sternal incision wound with purulent drainage with debridement  Last BM:  5/14  Height:   Ht Readings from Last 1 Encounters:  11/22/19 5' 6.5" (1.689 m)    Weight:   Wt Readings from Last 1 Encounters:  12/09/19 89.7 kg    Ideal  Body Weight:     BMI:  Body mass index is 31.44 kg/m.  Estimated Nutritional Needs:   Kcal:  2300-2700  Protein:  120-140 gm  Fluid:  1000 plus UOP   Kerman Passey MS, RDN, LDN, CNSC RD Pager Number and RD On-Call Pager Number Located in [place]

## 2019-12-09 NOTE — Consult Note (Signed)
[facility] Nurse wound follow up Wound type:midsternal nonhealing surgical wound.  VAC change.  Measurement:14 cm x 5 cm x 4.2 cm Wound bed:25% tan devitalized tissue 20% sternal hardware and 55% pink granulating tissue.  Drainage (amount, consistency, odor)  moderate serosanguinous with tan cloudy particulates Periwound:intact distal to the wound is an open surgical wound managed by bedside RNs.  Dressing procedure/placement/frequency: 1 piece black foam to wound bed.  Covered with drape and seal immediately .  Change Mon/Wed/Fri WOC team will follow.  Domenic Moras MSN, RN, FNP-BC CWON Wound, Ostomy, Continence Nurse Pager [phone]

## 2019-12-09 NOTE — Progress Notes (Addendum)
      [address]       [place] [address]             [phone]      10 Days Post-Op Procedure(s) (LRB): STERNAL WOUND DEBRIDEMENT (N/A) APPLICATION OF WOUND VAC (N/A)   Subjective:  On BIPAP overnight, currently undergoing bedside HD.. for wound vac change today.. no complaints.  Objective: Vital signs in last 24 hours: Temp:  [98 F (36.7 C)-100.4 F (38 C)] 100.4 F (38 C) (05/14 0710) Pulse Rate:  [85-99] 88 (05/14 0730) Cardiac Rhythm: Normal sinus rhythm (05/14 0400) Resp:  [14-31] 23 (05/14 0710) BP: (73-135)/(40-87) 132/74 (05/14 0730) SpO2:  [97 %-100 %] 100 % (05/14 0700) Weight:  [92.4 kg] 92.4 kg (05/14 0653)  Intake/Output from previous day: 05/13 0701 - 05/14 0700 In: 1024 [P.O.:340; Blood:384; IV Piggyback:300] Out: 100 [Drains:100]  General appearance: alert, cooperative and no distress Heart: regular rate and rhythm Lungs: clear to auscultation bilaterally Abdomen: soft, non-tender; bowel sounds normal; no masses,  no organomegaly Extremities: no edema present, necrotic toes present, no signs of infection Wound: wound vac in place on sternotom  Lab Results: Recent Labs    12/08/19 0425 12/09/19 0502  WBC 6.9 7.8  HGB 6.4* 7.2*  HCT 21.1* 23.5*  PLT 91* 90*   BMET:  Recent Labs    12/08/19 0425 12/09/19 0502  NA 134* 133*  K 3.6 4.0  CL 98 98  CO2 26 25  GLUCOSE 82 98  BUN 27* 57*  CREATININE 3.81* 6.04*  CALCIUM 8.9 7.8*    PT/INR: No results for input(s): LABPROT, INR in the last 72 hours. ABG    Component Value Date/Time   PHART 7.375 11/20/2019 0626   HCO3 23.4 11/20/2019 0626   TCO2 25 11/20/2019 0626   ACIDBASEDEF 2.0 11/20/2019 0626   O2SAT 62.4 12/09/2019 0502   CBG (last 3)  Recent Labs    12/08/19 2344 12/09/19 0457 12/09/19 0524  GLUCAP 112* >600* 87    Assessment/Plan: S/P Procedure(s) (LRB): STERNAL WOUND DEBRIDEMENT (N/A) APPLICATION OF WOUND VAC (N/A)  1. CV- NSR, BP  controlled-on Amiodarone, Midodrine 2. Pulm- no acute issues, BIPAP overnight, continue pulmonary toilet 3. Renal- ESRD, currently on HD, schedule is usually MWF 4. 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 Chronic Anemia- Hgb at 7.2, transfusion per Nephrology 6. Deconditioning- needs LTAC, medically stable once insurance authorization comes through 7. Dispo- patient stable, HD today, vac change today, continue Linezolid.Marland Kitchen low grade temp today, no leukocytosis.   LOS: 46 days    Ellwood Handler, PA-C 12/09/2019  Review of meds with pharmacy- has required intermittent blood transfusion, on plavix, full dose eliquis, ASA 81 - with blood loss anemia will d/c Plavix- with tissue aortic valve  - continue eliquis and low dose asa   Wound vac changed today     I have seen and examined Patrick [name] and agree with the above assessment  and plan.  Grace Isaac MD Beeper [phone] Office [phone] 12/09/2019 12:27 PM

## 2019-12-09 NOTE — Progress Notes (Addendum)
Inpatient Diabetes Program Recommendations  AACE/ADA: New Consensus Statement on Inpatient Glycemic Control   Target Ranges:  Prepandial:   less than 140 mg/dL      Peak postprandial:   less than 180 mg/dL (1-2 hours)      Critically ill patients:  140 - 180 mg/dL   Results for RICAHRD, SCHWAGER (MRN [id number]) as of 12/09/2019 09:59  Ref. Range 12/09/2019 04:57 12/09/2019 5:02  12/09/2019 05:24 12/09/2019 07:56 12/09/2019 07:57  Glucose-Capillary  Lab glucose Latest Ref Range: 70 - 99 mg/dL >600 (HH)    98 mg/dl 87 49 (L) 54 (L)  Results for EHTAN, DELFAVERO (MRN [id number]) as of 12/09/2019 09:59  Ref. Range 12/08/2019 07:45 12/08/2019 11:33 12/08/2019 15:39 12/08/2019 20:14 12/08/2019 23:44  Glucose-Capillary Latest Ref Range: 70 - 99 mg/dL 117 (H)  Lantus 6 units @ 9:56 116 (H) 103 (H) 104 (H) 112 (H)   Review of Glycemic Control  Diabetes history: DM2 Outpatient Diabetes medications: Prandin 1 mg BID Current orders for Inpatient glycemic control: Lantus 6 units daily, Novolog 0-9 units Q4H  Inpatient Diabetes Program Recommendations:   Insulin - Basal: Please consider decreasing Lantus to 4 units daily.  NOTE: Finger stick glucose >600 mg/dl on 12/09/19 at 4:57 am and lab glucose 98 mg/dl on 12/09/19 at 5:02 am so finger stick inaccurate. Patient received Lantus 6 units on 12/08/19 and fasting glucose 49 mg/dl today in chart. RN reports that 2 low glucose values from today are not correct (bad sample) and that accurate glucose was 85 mg/dl.  Patient is no longer ordered tube feeding.  Recommend decreasing Lantus slightly.  Thanks, Barnie Alderman, RN, MSN, CDE Diabetes Coordinator Inpatient Diabetes Program [phone] (Team Pager from 8am to 5pm)

## 2019-12-09 NOTE — Discharge Instructions (Signed)
1. Please change wound vac every MWF   Information on my medicine - ELIQUIS (apixaban)  This medication education was reviewed with me or my healthcare representative as part of my discharge preparation.    Why was Eliquis prescribed for you? Eliquis was prescribed for you to reduce the risk of a blood clot forming that can cause a stroke if you have a medical condition called atrial fibrillation (a type of irregular heartbeat).  What do You need to know about Eliquis ? Take your Eliquis TWICE DAILY - one tablet in the morning and one tablet in the evening with or without food. If you have difficulty swallowing the tablet whole please discuss with your pharmacist how to take the medication safely.  Take Eliquis exactly as prescribed by your doctor and DO NOT stop taking Eliquis without talking to the doctor who prescribed the medication.  Stopping may increase your risk of developing a stroke.  Refill your prescription before you run out.  After discharge, you should have regular check-up appointments with your healthcare provider that is prescribing your Eliquis.  In the future your dose may need to be changed if your kidney function or weight changes by a significant amount or as you get older.  What do you do if you miss a dose? If you miss a dose, take it as soon as you remember on the same day and resume taking twice daily.  Do not take more than one dose of ELIQUIS at the same time to make up a missed dose.  Important Safety Information A possible side effect of Eliquis is bleeding. You should call your healthcare provider right away if you experience any of the following: ? Bleeding from an injury or your nose that does not stop. ? Unusual colored urine (red or dark brown) or unusual colored stools (red or black). ? Unusual bruising for unknown reasons. ? A serious fall or if you hit your head (even if there is no bleeding).  Some medicines may interact with Eliquis and  might increase your risk of bleeding or clotting while on Eliquis. To help avoid this, consult your healthcare provider or pharmacist prior to using any new prescription or non-prescription medications, including herbals, vitamins, non-steroidal anti-inflammatory drugs (NSAIDs) and supplements.  This website has more information on Eliquis (apixaban): http://www.eliquis.com/eliquis/home

## 2019-12-09 NOTE — TOC Progression Note (Signed)
Transition of Care [facility]) - Progression Note    Patient Details  Name: Patrick [name] MRN: [id number] Date of Birth: 10/01/1955  Transition of Care [facility]) CM/SW [facility], RN Phone Number: 12/09/2019, 3:08 PM  Clinical Narrative:    Kathee Delton, RNCM at [facility] [phone] called stated that [facility] is requesting a Peer to Peer with a involved physician or PA with this patient before Monday, 12/12/2019 at noon.  I called Dr. Servando Snare at pager [phone] and he was unable to schedule a peer-to-peer today and asked that Dr. Julien Girt have the peer to peer on Monday when he return.  I spoke with Quincy Simmonds, PA and she was unable to have the peer to peer today as well and she states that she will send Dr. Orvan Seen an email to help assist with this matter. Will continue to follow and I will update the wife as well today.     Expected Discharge Plan: Long Term Acute Care (LTAC) Barriers to Discharge: Continued Medical Work up  Expected Discharge Plan and Services Expected Discharge Plan: Long Term Acute Care (LTAC)   Discharge Planning Services: CM Consult Post Acute Care Choice: Long Term Acute Care (LTAC) Living arrangements for the past 2 months: Apartment(lives in apartment on second floor - planning to change to 1st floor soon)                                       Social Determinants of Health (SDOH) Interventions    Readmission Risk Interventions Readmission Risk Prevention Plan 11/24/2019  Transportation Screening Complete  Medication Review [employer]) Complete  PCP or Specialist appointment within 3-5 days of discharge Complete  HRI or Home Care Consult Complete  SW Recovery Care/Counseling Consult Complete  Palliative Care Screening Complete  Skilled Nursing Facility Complete  Some recent data might be hidden

## 2019-12-09 NOTE — Progress Notes (Signed)
[name] Progress Note   Assessment/ Plan:   OP HD: [name] MWF   4h 400/800   99.5kg   2K/3.5Ca   TDC   Heparin 3000 -Mircera 75 (last 3/22)  -Hectorol 1    Assessment/ Plan: 1.  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 . On ASA, Plavix, statin.  Initially required CRRT, off as of Had HD 5/10, sequential 5/11., s/p HD 5/12.  He is requiring midodrine and still has significant IDH.  Finally making a dent in vol status esp now TF are off, 3L UF goal again today 5/14.    2.  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 ESRD: usual HD is MWF. Postop required CRRT due to hemodynamic instability.  Now back on MWF schedule 4 BP/ volume: BP's 90's- 100's, better. On midodrine 5.  Anemia ckd - Hb 7s. Getting ESA darbe 100ug qFri. CTM, increased to 200 mcg for 5/14.  Transfusion prn.  Adding on iron panel--> 46% sat, no Fe iron 6. 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 Nutrition: On dysphagia 3 diet with aspiration precautions based on results of swallow test.  TF off.   8.  Atrial fibrillation: With controlled ventricular response, on amio and Eliquis 9.   Dispo - for LTACH  Subjective:    HD today 5/14.  BP great, going for good UF goal of 3L again today.     Objective:   BP 125/79 (BP Location: Right Arm)   Pulse 95   Temp (!) 100.4 F (38 C) (Axillary)   Resp 17   Ht 5' 6.5" (1.689 m)   Wt 92.4 kg   SpO2 100%   BMI 32.39 kg/m   Physical Exam: Gen: sitting in bed, appears more comfortable CVS: RRR no m/r/g Resp: WOB better, full sentences unlabored today, 3L O2 Abd: + NABS Ext: trace LE edema,  ACCESS: L IJ TDC  Labs: BMET Recent Labs  Lab 12/03/19 1043 12/04/19 0340 12/05/19 0309 12/07/19 0320 12/08/19 0425 12/09/19 0502  NA 134* 133* 135 132* 134* 133*  K 4.4 5.0 5.3* 4.4 3.6 4.0  CL 100 98 99  98 98 98  CO2 26 23 21* _0 GLUCOSE 151* 150* 124* 157* 82 98  BUN 44* 60* 81* 59* 27* 57*  CREATININE 4.30* 5.35* 6.71* 5.90* 3.81* 6.04*  CALCIUM 6.2* 7.1* 6.6* 7.4* 8.9 7.8*  PHOS 3.0  --   --   --   --   --    CBC Recent Labs  Lab 12/06/19 0414 12/07/19 0320 12/08/19 0425 12/09/19 0502  WBC 9.0 9.8 6.9 7.8  HGB 6.3* 7.4* 6.4* 7.2*  HCT 21.1* 24.0* 21.1* 23.5*  MCV 95.0 92.3 94.6 93.3  PLT 130* 102* 91* 90*      Medications:    . sodium chloride   Intravenous Once  . sodium chloride   Intravenous Once  . amiodarone  200 mg Per Tube BID  . apixaban  5 mg Per Tube BID  . vitamin C  500 mg Per Tube BID  . aspirin  81 mg Per Tube Daily  . Chlorhexidine Gluconate Cloth  6 each Topical Daily  . Chlorhexidine Gluconate Cloth  6 each Topical Q0600  . clopidogrel  75 mg Per Tube Daily  . darbepoetin (ARANESP) injection - DIALYSIS  200 mcg Intravenous Q Fri-HD  .  feeding supplement (ENSURE ENLIVE)  237 mL Per Tube TID BM  . feeding supplement (PRO-STAT SUGAR FREE 64)  30 mL Per Tube QID  . heparin sodium (porcine)      . insulin aspart  0-9 Units Subcutaneous Q4H  . insulin glargine  6 Units Subcutaneous Daily  . levalbuterol  1.25 mg Nebulization TID  . midodrine  20 mg Per Tube TID WC  . multivitamin  1 tablet Oral QHS  . pantoprazole sodium  40 mg Per Tube Daily  . rosuvastatin  10 mg Per Tube q1800  . sodium chloride flush  10-40 mL Intracatheter Q12H     Madelon Lips, MD [facility] pgr [phone] 12/09/2019, 8:53 AM

## 2019-12-10 LAB — GLUCOSE, CAPILLARY
Glucose-Capillary: 101 mg/dL — ABNORMAL HIGH (ref 70–99)
Glucose-Capillary: 106 mg/dL — ABNORMAL HIGH (ref 70–99)
Glucose-Capillary: 110 mg/dL — ABNORMAL HIGH (ref 70–99)
Glucose-Capillary: 115 mg/dL — ABNORMAL HIGH (ref 70–99)
Glucose-Capillary: 128 mg/dL — ABNORMAL HIGH (ref 70–99)
Glucose-Capillary: 80 mg/dL (ref 70–99)
Glucose-Capillary: 99 mg/dL (ref 70–99)

## 2019-12-10 LAB — CBC
HCT: 23.4 % — ABNORMAL LOW (ref 39.0–52.0)
Hemoglobin: 7.3 g/dL — ABNORMAL LOW (ref 13.0–17.0)
MCH: 29.2 pg (ref 26.0–34.0)
MCHC: 31.2 g/dL (ref 30.0–36.0)
MCV: 93.6 fL (ref 80.0–100.0)
Platelets: 90 10*3/uL — ABNORMAL LOW (ref 150–400)
RBC: 2.5 MIL/uL — ABNORMAL LOW (ref 4.22–5.81)
RDW: 19.3 % — ABNORMAL HIGH (ref 11.5–15.5)
WBC: 7.9 10*3/uL (ref 4.0–10.5)
nRBC: 0 % (ref 0.0–0.2)

## 2019-12-10 LAB — COOXEMETRY PANEL
Carboxyhemoglobin: 2.1 % — ABNORMAL HIGH (ref 0.5–1.5)
Methemoglobin: 1.1 % (ref 0.0–1.5)
O2 Saturation: 59.1 %
Total hemoglobin: 11.7 g/dL — ABNORMAL LOW (ref 12.0–16.0)

## 2019-12-10 LAB — COMPREHENSIVE METABOLIC PANEL
ALT: 97 U/L — ABNORMAL HIGH (ref 0–44)
AST: 83 U/L — ABNORMAL HIGH (ref 15–41)
Albumin: 2.3 g/dL — ABNORMAL LOW (ref 3.5–5.0)
Alkaline Phosphatase: 249 U/L — ABNORMAL HIGH (ref 38–126)
Anion gap: 10 (ref 5–15)
BUN: 43 mg/dL — ABNORMAL HIGH (ref 8–23)
CO2: 26 mmol/L (ref 22–32)
Calcium: 8.7 mg/dL — ABNORMAL LOW (ref 8.9–10.3)
Chloride: 98 mmol/L (ref 98–111)
Creatinine, Ser: 4.71 mg/dL — ABNORMAL HIGH (ref 0.61–1.24)
GFR calc Af Amer: 14 mL/min — ABNORMAL LOW (ref >60)
GFR calc non Af Amer: 12 mL/min — ABNORMAL LOW (ref >60)
Glucose, Bld: 119 mg/dL — ABNORMAL HIGH (ref 70–99)
Potassium: 3.8 mmol/L (ref 3.5–5.1)
Sodium: 134 mmol/L — ABNORMAL LOW (ref 135–145)
Total Bilirubin: 1.2 mg/dL (ref 0.3–1.2)
Total Protein: 6.8 g/dL (ref 6.5–8.1)

## 2019-12-10 LAB — PREPARE RBC (CROSSMATCH)

## 2019-12-10 MED ORDER — SODIUM CHLORIDE 0.9% IV SOLUTION: Freq: Once | INTRAVENOUS | Status: DC

## 2019-12-10 NOTE — Plan of Care (Signed)
  Problem: Education: Goal: Ability to demonstrate management of disease process will improve Outcome: Progressing Goal: Ability to verbalize understanding of medication therapies will improve Outcome: Progressing Goal: Individualized Educational Video(s) Outcome: Progressing   Problem: Activity: Goal: Capacity to carry out activities will improve Outcome: Progressing   Problem: Cardiac: Goal: Ability to achieve and maintain adequate cardiopulmonary perfusion will improve Outcome: Progressing   Problem: Education: Goal: Knowledge of General Education information will improve Description: Including pain rating scale, medication(s)/side effects and non-pharmacologic comfort measures Outcome: Progressing   Problem: Health Behavior/Discharge Planning: Goal: Ability to manage health-related needs will improve Outcome: Progressing   Problem: Clinical Measurements: Goal: Ability to maintain clinical measurements within normal limits will improve Outcome: Progressing Goal: Will remain free from infection Outcome: Progressing Goal: Diagnostic test results will improve Outcome: Progressing Goal: Respiratory complications will improve Outcome: Progressing Goal: Cardiovascular complication will be avoided Outcome: Progressing   Problem: Activity: Goal: Risk for activity intolerance will decrease Outcome: Progressing   Problem: Nutrition: Goal: Adequate nutrition will be maintained Outcome: Progressing   Problem: Coping: Goal: Level of anxiety will decrease Outcome: Progressing   Problem: Elimination: Goal: Will not experience complications related to bowel motility Outcome: Progressing   Problem: Safety: Goal: Ability to remain free from injury will improve Outcome: Progressing   Problem: Skin Integrity: Goal: Risk for impaired skin integrity will decrease Outcome: Progressing   Problem: Education: Goal: Will demonstrate proper wound care and an understanding of  methods to prevent future damage Outcome: Progressing Goal: Knowledge of disease or condition will improve Outcome: Progressing Goal: Knowledge of the prescribed therapeutic regimen will improve Outcome: Progressing Goal: Individualized Educational Video(s) Outcome: Progressing   Problem: Activity: Goal: Risk for activity intolerance will decrease Outcome: Progressing   Problem: Cardiac: Goal: Will achieve and/or maintain hemodynamic stability Outcome: Progressing   Problem: Clinical Measurements: Goal: Postoperative complications will be avoided or minimized Outcome: Progressing   Problem: Respiratory: Goal: Respiratory status will improve Outcome: Progressing   Problem: Skin Integrity: Goal: Wound healing without signs and symptoms of infection Outcome: Progressing Goal: Risk for impaired skin integrity will decrease Outcome: Progressing

## 2019-12-10 NOTE — Progress Notes (Signed)
      [address]       [place] [address]             [phone]        CARDIOTHORACIC SURGERY PROGRESS NOTE   R11 Days Post-Op Procedure(s) (LRB): STERNAL WOUND DEBRIDEMENT (N/A) APPLICATION OF WOUND VAC (N/A)  Subjective: No complaints.  Very weak and deconditioned.  No appetite.  Objective: Vital signs: BP Readings from Last 1 Encounters:  12/10/19 (!) 92/52   Pulse Readings from Last 1 Encounters:  12/10/19 93   Resp Readings from Last 1 Encounters:  12/10/19 (!) 25   Temp Readings from Last 1 Encounters:  12/10/19 99.6 F (37.6 C) (Oral)    Hemodynamics:   Mixed venous co-ox 59%   Physical Exam:  Rhythm:   sinus  Breath sounds: clear  Heart sounds:  RRR  Incisions:  Wound VAC intact  Abdomen:  Soft, non-distended, non-tender  Extremities:  Warm, well-perfused    Intake/Output from previous day: 05/14 0701 - 05/15 0700 In: 1075.4 [NG/GT:120; IV Piggyback:905.4] Out: 3000  Intake/Output this shift: Total I/O In: 300 [NG/GT:300] Out: -   Lab Results:  CBC: Recent Labs    12/09/19 0502 12/10/19 0426  WBC 7.8 7.9  HGB 7.2* 7.3*  HCT 23.5* 23.4*  PLT 90* 90*    BMET:  Recent Labs    12/09/19 0502 12/10/19 0426  NA 133* 134*  K 4.0 3.8  CL 98 98  CO2 25 26  GLUCOSE 98 119*  BUN 57* 43*  CREATININE 6.04* 4.71*  CALCIUM 7.8* 8.7*     PT/INR:  No results for input(s): LABPROT, INR in the last 72 hours.  CBG (last 3)  Recent Labs    12/10/19 0003 12/10/19 0419 12/10/19 0734  GLUCAP 115* 128* 80    ABG    Component Value Date/Time   PHART 7.375 11/20/2019 0626   PCO2ART 39.8 11/20/2019 0626   PO2ART 103 11/20/2019 0626   HCO3 23.4 11/20/2019 0626   TCO2 25 11/20/2019 0626   ACIDBASEDEF 2.0 11/20/2019 0626   O2SAT 59.1 12/10/2019 0426    CXR: n/a  Assessment/Plan: S/P Procedure(s) (LRB): STERNAL WOUND DEBRIDEMENT (N/A) APPLICATION OF WOUND VAC (N/A)  Clinically stable NSR w/ stable BP on  midodrine, mixed venous co-ox has been stable for over a week Breathing comfortably w/ O2 sats 94-100% on 2 L/min ESRD on HD, tolerating HD weight down probably close to baseline - next HD planned for 5/17 Expected post op acute blood loss anemia with chronic anemia, Hgb stable 7.3 Malnutrition, tolerating dysphagia diet but poor appetite - receiving supplements via tube Severe deconditioning Open sternal wound with exposed sternum and wire   Continue current plan  Consider wound debridement and flap coverage   Rexene Alberts, MD 12/10/2019 9:53 AM

## 2019-12-10 NOTE — Progress Notes (Addendum)
Multiple loose BMs overnight w soft BP and Tmax 100.8  0700-1100 Poor po intake at breakfast Pt w diarrhea x 5 and associated abd cramping immediately after Ensure FMS reinserted; pt education provided Bathed  Nap on CPAP w 3L O2 bleed BP soft; asymptomatic  1200-1500 Poor po intake at lunch Wife visiting at bedside Refusing ensure r/t diarrhea Able to tolerate up to chair x1hr MD paged r/t soft BP (MAPs ~50, SBP 60-80); [facility] ordered  1400-1900 Educated pt and wife on need to take enough food and supplements by mouth to meet nutritional needs in order for NGT to be removed; expressed understanding. Some po intake at dinner 1u PRBC started Spontaneous improvement with BP prior to initiation of transfusion

## 2019-12-10 NOTE — Progress Notes (Signed)
[facility] Progress Note   Assessment/ Plan:   OP HD: [name] MWF   4h 400/800   99.5kg   2K/3.5Ca   TDC   Heparin 3000 -Mircera 75 (last 3/22)  -Hectorol 1    Assessment/ Plan: 1.  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 . On ASA, Plavix, statin.  Initially required CRRT, off as of Had HD 5/10, sequential 5/11., s/p HD 5/12, HD 5/14.  He is requiring midodrine and still has significant IDH.  Finally making a dent in vol status esp now TF are off, 3L UF goal 5/14.    2.  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 ESRD: usual HD is MWF. Postop required CRRT due to hemodynamic instability.  Now back on MWF schedule 4 BP/ volume: BP's 90's- 100's, better. On midodrine 5.  Anemia ckd - Hb 7s. Getting ESA darbe 100ug qFri. CTM, increased to 200 mcg for 5/14.  Transfusion prn.  Adding on iron panel--> 46% sat, no Fe iron 6. 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 Nutrition: On dysphagia 3 diet with aspiration precautions based on results of swallow test.  TF off.   8.  Atrial fibrillation: With controlled ventricular response, on amio and Eliquis 9.   Dispo - for LTACH  Subjective:    S/p HD, did well with 3L off.  Next HD planned for Monday   Objective:   BP 105/66   Pulse 96   Temp 99.6 F (37.6 C) (Oral)   Resp (!) 24   Ht 5' 6.5" (1.689 m)   Wt 89 kg   SpO2 94%   BMI 31.19 kg/m   Physical Exam: Gen: sitting in bed, appears more comfortable CVS: RRR no m/r/g Resp: WOB better, full sentences unlabored today, 3L O2 Abd: + NABS Ext: trace LE edema,  ACCESS: L IJ TDC  Labs: BMET Recent Labs  Lab 12/03/19 1043 12/04/19 0340 12/05/19 0309 12/07/19 0320 12/08/19 0425 12/09/19 0502 12/10/19 0426  NA 134* 133* 135 132* 134* 133* 134*  K 4.4 5.0 5.3* 4.4 3.6 4.0 3.8  CL 100 98 99 98 98 98 98  CO2 26 23  21* 22 26 25 26   GLUCOSE 151* 150* 124* 157* 82 98 119*  BUN 44* 60* 81* 59* 27* 57* 43*  CREATININE 4.30* 5.35* 6.71* 5.90* 3.81* 6.04* 4.71*  CALCIUM 6.2* 7.1* 6.6* 7.4* 8.9 7.8* 8.7*  PHOS 3.0  --   --   --   --   --   --    CBC Recent Labs  Lab 12/07/19 0320 12/08/19 0425 12/09/19 0502 12/10/19 0426  WBC 9.8 6.9 7.8 7.9  HGB 7.4* 6.4* 7.2* 7.3*  HCT 24.0* 21.1* 23.5* 23.4*  MCV 92.3 94.6 93.3 93.6  PLT 102* 91* 90* 90*      Medications:    . sodium chloride   Intravenous Once  . sodium chloride   Intravenous Once  . amiodarone  200 mg Per Tube BID  . apixaban  5 mg Per Tube BID  . vitamin C  500 mg Per Tube BID  . aspirin  81 mg Per Tube Daily  . Chlorhexidine Gluconate Cloth  6 each Topical Daily  . Chlorhexidine Gluconate Cloth  6 each Topical Q0600  . darbepoetin (ARANESP) injection - DIALYSIS  200 mcg Intravenous Q Fri-HD  . feeding supplement (ENSURE  ENLIVE)  237 mL Per Tube TID BM  . feeding supplement (PRO-STAT SUGAR FREE 64)  30 mL Per Tube QID  . insulin aspart  0-9 Units Subcutaneous Q4H  . insulin glargine  4 Units Subcutaneous Daily  . levalbuterol  1.25 mg Nebulization TID  . midodrine  20 mg Per Tube TID WC  . multivitamin  1 tablet Oral QHS  . pantoprazole sodium  40 mg Per Tube Daily  . rosuvastatin  10 mg Per Tube q1800  . sodium chloride flush  10-40 mL Intracatheter Q12H     Madelon Lips, MD [place] pgr [phone] 12/10/2019, 9:30 AM

## 2019-12-11 LAB — COMPREHENSIVE METABOLIC PANEL
ALT: 86 U/L — ABNORMAL HIGH (ref 0–44)
AST: 74 U/L — ABNORMAL HIGH (ref 15–41)
Albumin: 2.2 g/dL — ABNORMAL LOW (ref 3.5–5.0)
Alkaline Phosphatase: 227 U/L — ABNORMAL HIGH (ref 38–126)
Anion gap: 13 (ref 5–15)
BUN: 67 mg/dL — ABNORMAL HIGH (ref 8–23)
CO2: 24 mmol/L (ref 22–32)
Calcium: 8.1 mg/dL — ABNORMAL LOW (ref 8.9–10.3)
Chloride: 95 mmol/L — ABNORMAL LOW (ref 98–111)
Creatinine, Ser: 6.35 mg/dL — ABNORMAL HIGH (ref 0.61–1.24)
GFR calc Af Amer: 10 mL/min — ABNORMAL LOW (ref >60)
GFR calc non Af Amer: 8 mL/min — ABNORMAL LOW (ref >60)
Glucose, Bld: 259 mg/dL — ABNORMAL HIGH (ref 70–99)
Potassium: 4.1 mmol/L (ref 3.5–5.1)
Sodium: 132 mmol/L — ABNORMAL LOW (ref 135–145)
Total Bilirubin: 1.1 mg/dL (ref 0.3–1.2)
Total Protein: 6.5 g/dL (ref 6.5–8.1)

## 2019-12-11 LAB — CBC
HCT: 25.5 % — ABNORMAL LOW (ref 39.0–52.0)
Hemoglobin: 8 g/dL — ABNORMAL LOW (ref 13.0–17.0)
MCH: 28.1 pg (ref 26.0–34.0)
MCHC: 31.4 g/dL (ref 30.0–36.0)
MCV: 89.5 fL (ref 80.0–100.0)
Platelets: 78 10*3/uL — ABNORMAL LOW (ref 150–400)
RBC: 2.85 MIL/uL — ABNORMAL LOW (ref 4.22–5.81)
RDW: 20.1 % — ABNORMAL HIGH (ref 11.5–15.5)
WBC: 6.9 10*3/uL (ref 4.0–10.5)
nRBC: 0 % (ref 0.0–0.2)

## 2019-12-11 LAB — GLUCOSE, CAPILLARY
Glucose-Capillary: 109 mg/dL — ABNORMAL HIGH (ref 70–99)
Glucose-Capillary: 101 mg/dL — ABNORMAL HIGH (ref 70–99)
Glucose-Capillary: 107 mg/dL — ABNORMAL HIGH (ref 70–99)
Glucose-Capillary: 101 mg/dL — ABNORMAL HIGH (ref 70–99)
Glucose-Capillary: 152 mg/dL — ABNORMAL HIGH (ref 70–99)

## 2019-12-11 LAB — COOXEMETRY PANEL
Carboxyhemoglobin: 2.2 % — ABNORMAL HIGH (ref 0.5–1.5)
Methemoglobin: 1.2 % (ref 0.0–1.5)
O2 Saturation: 60.2 %
Total hemoglobin: 9.9 g/dL — ABNORMAL LOW (ref 12.0–16.0)

## 2019-12-11 MED ORDER — ORAL CARE MOUTH RINSE
15.0000 mL | Freq: Two times a day (BID) | OROMUCOSAL | Status: DC
  Administered 2019-12-11 – 2019-12-13 (×5): 15 mL via OROMUCOSAL

## 2019-12-11 MED ORDER — MIDODRINE HCL 5 MG PO TABS
10.0000 mg | ORAL_TABLET | Freq: Once | ORAL | Status: AC
  Administered 2019-12-11: 10 mg via ORAL
  Filled 2019-12-11: qty 2

## 2019-12-11 MED ORDER — PANTOPRAZOLE SODIUM 40 MG PO TBEC
40.0000 mg | DELAYED_RELEASE_TABLET | Freq: Every day | ORAL | Status: DC
  Administered 2019-12-12 – 2019-12-13 (×2): 40 mg via ORAL
  Filled 2019-12-11 (×2): qty 1

## 2019-12-11 NOTE — Progress Notes (Addendum)
2u PRBC overnight Large volume diarrhea again following Ensure receipt  0700-1100 Pt refusing breakfast and refusing Ensure; offered to give via tube, but he declined 2/2 resulting diarrhea Some increased WOB this morning; RT replaced CPAP Pt refusing getting up to chair Pt's wife called for update; pt's hesitance to participate in today's care d/w her Pt c/o visual hallucinations by which he is undisturbed (he says it looks like he's flipping through the television, though he knows the TV is not on) Consulted nephro for hypotension; 1x dose midodrine given  1100-1500 Some effort at lunch, but still refusing Ensure Up to chair x1hr Wife visiting at bedside BP improved  1500-1900 Good po effort for dinner WOB remains elevated today and pt has congested/productive cough; RT aware, xopenex neb given, but bbs remain wet on auscultation Pt amenable to Ensure given slowly (over an hour) to see if it helps ease diarrhea r/t bolus administration  At completion of slow ensure bolus, pt without diarrhea and associated cramping ~1hr later, pt became nauseated on CPAP and was able to remove mask before single episode of emesis; no evidence of aspiration

## 2019-12-11 NOTE — Progress Notes (Signed)
Pt placed back on Bipap due to SOB.

## 2019-12-11 NOTE — Progress Notes (Signed)
[facility] BRIEF SICU PROGRESS NOTE  12 Days Post-Op  S/P Procedure(s) (LRB): STERNAL WOUND DEBRIDEMENT (N/A) APPLICATION OF WOUND VAC (N/A)   Stable day  Plan: Continue current plan  Rexene Alberts, MD 12/11/2019 5:22 PM

## 2019-12-11 NOTE — Progress Notes (Signed)
[facility] Progress Note   Assessment/ Plan:   OP HD: [name] MWF   4h 400/800   99.5kg   2K/3.5Ca   TDC   Heparin 3000 -Mircera 75 (last 3/22)  -Hectorol 1    Assessment/ Plan: 1.  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 . On ASA, Plavix, statin.  Initially required CRRT, off as of Had HD 5/10, sequential 5/11., s/p HD 5/12, HD 5/14.  He is requiring midodrine and still has significant IDH.  Finally making a dent in vol status esp now TF are not on continuously, 3L UF goal 5/14.  For HD tomorrow 5/17, first shift    2.  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 ESRD: usual HD is MWF. Postop required CRRT due to hemodynamic instability.  Now back on MWF schedule 4 BP/ volume: BP up and down. On midodrine 5.  Anemia ckd - Hb 7s. Getting ESA darbe 100ug qFri. CTM, increased to 200 mcg for 5/14.  Transfusion prn.  Adding on iron panel--> 46% sat, no Fe iron 6. 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 Nutrition: On dysphagia 3 diet with aspiration precautions based on results of swallow test.  TF off.   8.  Atrial fibrillation: With controlled ventricular response, on amio and Eliquis 9.   Dispo - for LTACH  Subjective:    Having visual hallucinations, got 2 u pRBC yesterday, repeat CBC pending.  MAPs low, extra 10 mg of midodrine given.   Objective:   BP (!) 72/50 (BP Location: Right Arm)   Pulse 79   Temp 98.9 F (37.2 C) (Axillary)   Resp 20   Ht 5' 6.5" (1.689 m)   Wt 89 kg   SpO2 100%   BMI 31.19 kg/m   Physical Exam: Gen: sitting in bed, on BiPaP, sleeping arousable CVS: RRR no m/r/g Resp: able to speak without breathlessness in full sentences Abd: + NABS Ext: trace LE edema,  ACCESS: L IJ TDC  Labs: BMET Recent Labs  Lab 12/05/19 0309 12/07/19 0320 12/08/19 0425 12/09/19 0502 12/10/19 0426  12/11/19 0500  NA 135 132* 134* 133* 134* 132*  K 5.3* 4.4 3.6 4.0 3.8 4.1  CL 99 98 98 98 98 95*  CO2 21* _0 GLUCOSE 124* 157* 82 98 119* 259*  BUN 81* 59* 27* 57* 43* 67*  CREATININE 6.71* 5.90* 3.81* 6.04* 4.71* 6.35*  CALCIUM 6.6* 7.4* 8.9 7.8* 8.7* 8.1*   CBC Recent Labs  Lab 12/07/19 0320 12/08/19 0425 12/09/19 0502 12/10/19 0426  WBC 9.8 6.9 7.8 7.9  HGB 7.4* 6.4* 7.2* 7.3*  HCT 24.0* 21.1* 23.5* 23.4*  MCV 92.3 94.6 93.3 93.6  PLT 102* 91* 90* 90*      Medications:    . sodium chloride   Intravenous Once  . sodium chloride   Intravenous Once  . sodium chloride   Intravenous Once  . amiodarone  200 mg Per Tube BID  . apixaban  5 mg Per Tube BID  . vitamin C  500 mg Per Tube BID  . aspirin  81 mg Per Tube Daily  . Chlorhexidine Gluconate Cloth  6 each Topical Daily  . darbepoetin (ARANESP) injection - DIALYSIS  200 mcg Intravenous Q Fri-HD  . feeding supplement (ENSURE ENLIVE)  237 mL Per Tube TID BM  . feeding supplement (  PRO-STAT SUGAR FREE 64)  30 mL Per Tube QID  . insulin aspart  0-9 Units Subcutaneous Q4H  . mouth rinse  15 mL Mouth Rinse BID  . midodrine  10 mg Oral Once  . midodrine  20 mg Per Tube TID WC  . multivitamin  1 tablet Oral QHS  . pantoprazole  40 mg Oral Daily  . rosuvastatin  10 mg Per Tube q1800  . sodium chloride flush  10-40 mL Intracatheter Q12H     Madelon Lips, MD [facility] pgr [phone] 12/11/2019, 10:57 AM

## 2019-12-11 NOTE — Progress Notes (Signed)
      [address]       [place] [address]             [phone]        CARDIOTHORACIC SURGERY PROGRESS NOTE   R12 Days Post-Op Procedure(s) (LRB): STERNAL WOUND DEBRIDEMENT (N/A) APPLICATION OF WOUND VAC (N/A)  Subjective: No complaints.  Denies pain, SOB.  Didn't eat anything for breakfast.  Very weak  Objective: Vital signs: BP Readings from Last 1 Encounters:  12/11/19 (!) 72/50   Pulse Readings from Last 1 Encounters:  12/11/19 79   Resp Readings from Last 1 Encounters:  12/11/19 20   Temp Readings from Last 1 Encounters:  12/11/19 98.9 F (37.2 C) (Axillary)    Hemodynamics:    Physical Exam:  Rhythm:   sinus  Breath sounds: Scattered rhonchi  Heart sounds:  RRR  Incisions:  Wound VAC intact  Abdomen:  Soft, non-distended, non-tender  Extremities:  Warm, adequately perfused    Intake/Output from previous day: 05/15 0701 - 05/16 0700 In: 1927.5 [P.O.:240; Blood:527.5; NG/GT:560; IV Piggyback:600] Out: 450 [Drains:50; Stool:400] Intake/Output this shift: Total I/O In: 130 [Other:30; NG/GT:100] Out: -   Lab Results:  CBC: Recent Labs    12/09/19 0502 12/10/19 0426  WBC 7.8 7.9  HGB 7.2* 7.3*  HCT 23.5* 23.4*  PLT 90* 90*    BMET:  Recent Labs    12/10/19 0426 12/11/19 0500  NA 134* 132*  K 3.8 4.1  CL 98 95*  CO2 26 24  GLUCOSE 119* 259*  BUN 43* 67*  CREATININE 4.71* 6.35*  CALCIUM 8.7* 8.1*     PT/INR:  No results for input(s): LABPROT, INR in the last 72 hours.  CBG (last 3)  Recent Labs    12/10/19 2311 12/11/19 0313 12/11/19 0811  GLUCAP 106* 109* 101*    ABG    Component Value Date/Time   PHART 7.375 11/20/2019 0626   PCO2ART 39.8 11/20/2019 0626   PO2ART 103 11/20/2019 0626   HCO3 23.4 11/20/2019 0626   TCO2 25 11/20/2019 0626   ACIDBASEDEF 2.0 11/20/2019 0626   O2SAT 60.2 12/11/2019 0525    CXR: n/a  Assessment/Plan: S/P Procedure(s) (LRB): STERNAL WOUND DEBRIDEMENT  (N/A) APPLICATION OF WOUND VAC (N/A)  Clinically stable NSR w/ marginal BP on midodrine Breathing comfortably w/ O2 sats 94-100% on 2 L/min ESRD on HD, tolerating HD weight down probably close to baseline - next HD planned for 5/17 Expected post op acute blood loss anemia with chronic anemia, received 2 units PRBCs overnight for hypotension - f/u CBC pending Chronic thrombocytopenia - f/u CBC pending Malnutrition, severe - serum albumin 2.2 - tolerating dysphagia diet but very poor appetite and not eating much - receiving supplements via tube Open sternal wound with exposed sternum and wire w/ poor appearance of wound Severe deconditioning   Continue current plan  Consider wound debridement and flap coverage   Rexene Alberts, MD 12/11/2019 10:38 AM

## 2019-12-11 NOTE — Plan of Care (Signed)
  Problem: Education: Goal: Ability to demonstrate management of disease process will improve Outcome: Progressing Goal: Ability to verbalize understanding of medication therapies will improve Outcome: Progressing Goal: Individualized Educational Video(s) Outcome: Progressing   Problem: Activity: Goal: Capacity to carry out activities will improve Outcome: Progressing   Problem: Cardiac: Goal: Ability to achieve and maintain adequate cardiopulmonary perfusion will improve Outcome: Progressing   Problem: Education: Goal: Knowledge of General Education information will improve Description: Including pain rating scale, medication(s)/side effects and non-pharmacologic comfort measures Outcome: Progressing   Problem: Health Behavior/Discharge Planning: Goal: Ability to manage health-related needs will improve Outcome: Progressing   Problem: Clinical Measurements: Goal: Ability to maintain clinical measurements within normal limits will improve Outcome: Progressing Goal: Will remain free from infection Outcome: Progressing Goal: Diagnostic test results will improve Outcome: Progressing Goal: Respiratory complications will improve Outcome: Progressing Goal: Cardiovascular complication will be avoided Outcome: Progressing   Problem: Activity: Goal: Risk for activity intolerance will decrease Outcome: Progressing   Problem: Nutrition: Goal: Adequate nutrition will be maintained Outcome: Progressing   Problem: Coping: Goal: Level of anxiety will decrease Outcome: Progressing   Problem: Elimination: Goal: Will not experience complications related to bowel motility Outcome: Progressing   Problem: Safety: Goal: Ability to remain free from injury will improve Outcome: Progressing   Problem: Skin Integrity: Goal: Risk for impaired skin integrity will decrease Outcome: Progressing   Problem: Education: Goal: Will demonstrate proper wound care and an understanding of  methods to prevent future damage Outcome: Progressing Goal: Knowledge of disease or condition will improve Outcome: Progressing Goal: Knowledge of the prescribed therapeutic regimen will improve Outcome: Progressing Goal: Individualized Educational Video(s) Outcome: Progressing   Problem: Activity: Goal: Risk for activity intolerance will decrease Outcome: Progressing   Problem: Cardiac: Goal: Will achieve and/or maintain hemodynamic stability Outcome: Progressing   Problem: Clinical Measurements: Goal: Postoperative complications will be avoided or minimized Outcome: Progressing   Problem: Respiratory: Goal: Respiratory status will improve Outcome: Progressing   Problem: Skin Integrity: Goal: Wound healing without signs and symptoms of infection Outcome: Progressing Goal: Risk for impaired skin integrity will decrease Outcome: Progressing

## 2019-12-11 NOTE — Progress Notes (Signed)
Shift change patient vomited all of the Ensure that was given at 1800. Previous nurse stated that she ran the ensure over an hour on the kangeroo pump since patient had complained that it hurt his stomach.  Previous night patient stooled the entire bed immediately after receiving the ensure, which also happened during the day 12/10/19. 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

## 2019-12-12 ENCOUNTER — Ambulatory Visit: Payer: Medicare HMO | Admitting: Cardiothoracic Surgery

## 2019-12-12 ENCOUNTER — Inpatient Hospital Stay (HOSPITAL_COMMUNITY): Payer: Medicare HMO

## 2019-12-12 LAB — COMPREHENSIVE METABOLIC PANEL
ALT: 76 U/L — ABNORMAL HIGH (ref 0–44)
AST: 65 U/L — ABNORMAL HIGH (ref 15–41)
Albumin: 2 g/dL — ABNORMAL LOW (ref 3.5–5.0)
Alkaline Phosphatase: 235 U/L — ABNORMAL HIGH (ref 38–126)
Anion gap: 14 (ref 5–15)
BUN: 90 mg/dL — ABNORMAL HIGH (ref 8–23)
CO2: 23 mmol/L (ref 22–32)
Calcium: 7.7 mg/dL — ABNORMAL LOW (ref 8.9–10.3)
Chloride: 98 mmol/L (ref 98–111)
Creatinine, Ser: 8.08 mg/dL — ABNORMAL HIGH (ref 0.61–1.24)
GFR calc Af Amer: 7 mL/min — ABNORMAL LOW (ref >60)
GFR calc non Af Amer: 6 mL/min — ABNORMAL LOW (ref >60)
Glucose, Bld: 106 mg/dL — ABNORMAL HIGH (ref 70–99)
Potassium: 3.9 mmol/L (ref 3.5–5.1)
Sodium: 135 mmol/L (ref 135–145)
Total Bilirubin: 1.2 mg/dL (ref 0.3–1.2)
Total Protein: 6 g/dL — ABNORMAL LOW (ref 6.5–8.1)

## 2019-12-12 LAB — CBC
HCT: 25 % — ABNORMAL LOW (ref 39.0–52.0)
Hemoglobin: 7.9 g/dL — ABNORMAL LOW (ref 13.0–17.0)
MCH: 28 pg (ref 26.0–34.0)
MCHC: 31.6 g/dL (ref 30.0–36.0)
MCV: 88.7 fL (ref 80.0–100.0)
Platelets: 79 10*3/uL — ABNORMAL LOW (ref 150–400)
RBC: 2.82 MIL/uL — ABNORMAL LOW (ref 4.22–5.81)
RDW: 19.7 % — ABNORMAL HIGH (ref 11.5–15.5)
WBC: 6.9 10*3/uL (ref 4.0–10.5)
nRBC: 0 % (ref 0.0–0.2)

## 2019-12-12 LAB — GLUCOSE, CAPILLARY
Glucose-Capillary: 102 mg/dL — ABNORMAL HIGH (ref 70–99)
Glucose-Capillary: 114 mg/dL — ABNORMAL HIGH (ref 70–99)
Glucose-Capillary: 115 mg/dL — ABNORMAL HIGH (ref 70–99)
Glucose-Capillary: 119 mg/dL — ABNORMAL HIGH (ref 70–99)
Glucose-Capillary: 80 mg/dL (ref 70–99)
Glucose-Capillary: 86 mg/dL (ref 70–99)
Glucose-Capillary: 89 mg/dL (ref 70–99)

## 2019-12-12 LAB — PREALBUMIN: Prealbumin: 13.4 mg/dL — ABNORMAL LOW (ref 18–38)

## 2019-12-12 MED ORDER — SALINE SPRAY 0.65 % NA SOLN
1.0000 | NASAL | Status: DC | PRN
  Filled 2019-12-12: qty 44

## 2019-12-12 MED ORDER — ALBUMIN HUMAN 25 % IV SOLN
INTRAVENOUS | Status: AC
  Filled 2019-12-12: qty 200

## 2019-12-12 MED ORDER — HEPARIN SODIUM (PORCINE) 1000 UNIT/ML IJ SOLN
INTRAMUSCULAR | Status: AC
  Filled 2019-12-12: qty 5

## 2019-12-12 NOTE — Procedures (Signed)
   I was present at this dialysis session, have reviewed the session itself and made  appropriate changes Kelly Splinter MD [place] pager [phone]   12/12/2019, 2:25 PM

## 2019-12-12 NOTE — Progress Notes (Signed)
[facility] Progress Note  Subjective: seen in room, on HD, BP 90's, some SOB overnight  Vitals:   12/12/19 0942 12/12/19 0945 12/12/19 1000 12/12/19 1015  BP: 117/69 95/63 (!) 98/46 (!) 99/57  Pulse: 86 84 85 88  Resp:   (!) 23   Temp:      TempSrc:      SpO2:   100%   Weight:      Height:        Exam:  a lert, nasal O2, no distress  no jvd   Chest clear R, dec'd L base   Cor reg , sternotomy wound w/ vac in place   Abd obese no abd wall edema   Ext no edema , some gangrenous toe changes    Dialysis: [name] MWF 4h 400/800 99.5kg 2K/3.5Ca TDC Heparin 3000 -Mircera 75 (last 3/22)  -Hectorol 1  Assessment/ Plan: 1. 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 . On ASA, Plavix, statin.  Initially required CRRT, now on iHD 2.  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 ESRD:usual HD is MWF. Postop required CRRT due to hemodynamic instability.  Now back on MWF.   4 BP/ volume: dry wt was 99kg, peak wt 118kg postop 4/3, nadir 83kg 4/29, then 2nd peak 96kg last week now down to 89kg. On exam L chest effusion and no edema, CXR suggest L effusion, maybe vasc congestion. Trying for another 3kg UF today w/ HD.  5. Anemia ckd - Hb7s. Getting ESA darbe 100ug qFri. CTM, increased to 200 mcg 5/14.  Transfusion prn.  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 Nutrition: On dysphagia 3 diet with aspiration precautions based on results of swallow test.TF off.   8. Atrial fibrillation: With controlled ventricular response, on amio and Eliquis 9. Dispo - for [employer] 12/12/2019, 10:24 AM   Recent Labs  Lab 12/10/19 0426 12/11/19 0500 12/11/19 1048 12/12/19 0500  K  --  4.1  --  3.9  BUN  --  67*  --  90*  CREATININE  --  6.35*  --  8.08*  CALCIUM  --  8.1*  --  7.7*  HGB   < >  --   8.0* 7.9*   < > = values in this interval not displayed.   Inpatient medications: . sodium chloride   Intravenous Once  . sodium chloride   Intravenous Once  . sodium chloride   Intravenous Once  . amiodarone  200 mg Per Tube BID  . apixaban  5 mg Per Tube BID  . vitamin C  500 mg Per Tube BID  . aspirin  81 mg Per Tube Daily  . Chlorhexidine Gluconate Cloth  6 each Topical Daily  . darbepoetin (ARANESP) injection - DIALYSIS  200 mcg Intravenous Q Fri-HD  . feeding supplement (ENSURE ENLIVE)  237 mL Per Tube TID BM  . feeding supplement (PRO-STAT SUGAR FREE 64)  30 mL Per Tube QID  . heparin sodium (porcine)      . insulin aspart  0-9 Units Subcutaneous Q4H  . mouth rinse  15 mL Mouth Rinse BID  . midodrine  20 mg Per Tube TID WC  . multivitamin  1 tablet Oral QHS  . pantoprazole  40 mg Oral Daily  . rosuvastatin  10 mg Per Tube q1800  .  sodium chloride flush  10-40 mL Intracatheter Q12H   . sodium chloride Stopped (11/21/19 1749)  . sodium chloride Stopped (12/05/19 0144)  . albumin human     sodium chloride, acetaminophen (TYLENOL) oral liquid 160 mg/5 mL, dextrose, diphenhydrAMINE, fentaNYL (SUBLIMAZE) injection, Gerhardt's butt cream, heparin, heparin, heparin, levalbuterol, lip balm, metoprolol tartrate, ondansetron (ZOFRAN) IV, oxyCODONE, phenol, sodium chloride flush

## 2019-12-12 NOTE — Plan of Care (Signed)
  Problem: Education: Goal: Ability to demonstrate management of disease process will improve Outcome: Progressing Goal: Ability to verbalize understanding of medication therapies will improve Outcome: Progressing Goal: Individualized Educational Video(s) Outcome: Progressing   Problem: Activity: Goal: Capacity to carry out activities will improve Outcome: Progressing   Problem: Cardiac: Goal: Ability to achieve and maintain adequate cardiopulmonary perfusion will improve Outcome: Progressing   Problem: Education: Goal: Knowledge of General Education information will improve Description: Including pain rating scale, medication(s)/side effects and non-pharmacologic comfort measures Outcome: Progressing   Problem: Health Behavior/Discharge Planning: Goal: Ability to manage health-related needs will improve Outcome: Progressing   Problem: Clinical Measurements: Goal: Ability to maintain clinical measurements within normal limits will improve Outcome: Progressing Goal: Will remain free from infection Outcome: Progressing Goal: Diagnostic test results will improve Outcome: Progressing Goal: Respiratory complications will improve Outcome: Progressing Goal: Cardiovascular complication will be avoided Outcome: Progressing   Problem: Activity: Goal: Risk for activity intolerance will decrease Outcome: Progressing   Problem: Nutrition: Goal: Adequate nutrition will be maintained Outcome: Progressing   Problem: Coping: Goal: Level of anxiety will decrease Outcome: Progressing   Problem: Elimination: Goal: Will not experience complications related to bowel motility Outcome: Progressing   Problem: Safety: Goal: Ability to remain free from injury will improve Outcome: Progressing   Problem: Skin Integrity: Goal: Risk for impaired skin integrity will decrease Outcome: Progressing   Problem: Education: Goal: Will demonstrate proper wound care and an understanding of  methods to prevent future damage Outcome: Progressing Goal: Knowledge of disease or condition will improve Outcome: Progressing Goal: Knowledge of the prescribed therapeutic regimen will improve Outcome: Progressing Goal: Individualized Educational Video(s) Outcome: Progressing   Problem: Activity: Goal: Risk for activity intolerance will decrease Outcome: Progressing   Problem: Cardiac: Goal: Will achieve and/or maintain hemodynamic stability Outcome: Progressing   Problem: Clinical Measurements: Goal: Postoperative complications will be avoided or minimized Outcome: Progressing   Problem: Respiratory: Goal: Respiratory status will improve Outcome: Progressing   Problem: Skin Integrity: Goal: Wound healing without signs and symptoms of infection Outcome: Progressing Goal: Risk for impaired skin integrity will decrease Outcome: Progressing

## 2019-12-12 NOTE — TOC Progression Note (Signed)
Transition of Care [facility]) - Progression Note    Patient Details  Name: Patrick [name] MRN: [id number] Date of Birth: 10-Mar-1956  Transition of Care [facility]) CM/SW [place], RN Phone Number: 12/12/2019, 10:54 AM  Clinical Narrative:    Case management received a call from Kathee Delton, [place] with [facility] that the patient has been approved for LTAC at [facility] from 12/12/2019 until 12/16/2019 for the 5-day period prior to the Peer-to-Peer today with Dr. Julien Girt.  Dr. Julien Girt is aware of the patient's insurance approval days and cardiology will continue to follow once the patient is medically stable to transfer.   Expected Discharge Plan: Long Term Acute Care (LTAC) Barriers to Discharge: Continued Medical Work up  Expected Discharge Plan and Services Expected Discharge Plan: Long Term Acute Care (LTAC)   Discharge Planning Services: CM Consult Post Acute Care Choice: Long Term Acute Care (LTAC) Living arrangements for the past 2 months: Apartment(lives in apartment on second floor - planning to change to 1st floor soon)                     Social Determinants of Health (SDOH) Interventions    Readmission Risk Interventions Readmission Risk Prevention Plan 11/24/2019  Transportation Screening Complete  Medication Review [employer]) Complete  PCP or Specialist appointment within 3-5 days of discharge Complete  HRI or Home Care Consult Complete  SW Recovery Care/Counseling Consult Complete  Palliative Care Screening Complete  Skilled Nursing Facility Complete  Some recent data might be hidden

## 2019-12-12 NOTE — TOC Transition Note (Signed)
Transition of Care [facility]) - CM/SW Discharge Note   Patient Details  Name: Patrick [name] MRN: [id number] Date of Birth: June 03, 1956  Transition of Care [facility]) CM/SW Contact:  Curlene Labrum, RN Phone Number: 12/12/2019, 3:45 PM   Clinical Narrative:    Case management spoke with Kathee Delton, RNCM with [facility].  Patient to transfer to [facility] on 12/13/2019.  The primary 2H RN can call report to [phone] or [phone] for placement in Room 324.  Dr. Julien Girt updated and given information to give physician to physician report tomorrow prior to transfer to Dr. Abelardo Diesel [phone]. A hard copy of the Discharge Summary to be included in the packet to be sent with [facility].   Final next level of care: Long Term Acute Care (LTAC) Barriers to Discharge: Continued Medical Work up   Patient Goals and CMS Choice Patient states their goals for this hospitalization and ongoing recovery are:: Patient's wife receptive to transferring the patient to [facility] when able. CMS Medicare.gov Compare Post Acute Care list provided to:: Patient Represenative (must comment)(Sheila, wife) Choice offered to / list presented to : [place] / Guardian  Discharge Placement     Transfer to [facility] on 12/13/19.  Discharge Plan and Services   Discharge Planning Services: CM Consult Post Acute Care Choice: Long Term Acute Care (LTAC)                               Social Determinants of Health (SDOH) Interventions     Readmission Risk Interventions Readmission Risk Prevention Plan 11/24/2019  Transportation Screening Complete  Medication Review Press photographer) Complete  PCP or Specialist appointment within 3-5 days of discharge Complete  HRI or Home Care Consult Complete  SW Recovery Care/Counseling Consult Complete  Palliative Care Screening Complete  Skilled Nursing Facility Complete  Some recent data might be hidden

## 2019-12-12 NOTE — Consult Note (Signed)
WOC contacted bedside nurse to see patient for dressing change. Hildred Alamin RN reported that she would change with surgeon at [facility] today per his request. Supplies ordered for dressing changes later in the week.   [place], [place], [place]

## 2019-12-13 ENCOUNTER — Ambulatory Visit: Payer: Medicare HMO | Admitting: Physician Assistant

## 2019-12-13 LAB — GLUCOSE, CAPILLARY
Glucose-Capillary: 94 mg/dL (ref 70–99)
Glucose-Capillary: 103 mg/dL — ABNORMAL HIGH (ref 70–99)
Glucose-Capillary: 92 mg/dL (ref 70–99)
Glucose-Capillary: 87 mg/dL (ref 70–99)

## 2019-12-13 MED ORDER — MIDODRINE HCL 10 MG PO TABS: 20.0000 mg | ORAL_TABLET | Freq: Three times a day (TID) | ORAL | Status: AC

## 2019-12-13 MED ORDER — AMIODARONE HCL 200 MG PO TABS: 200.0000 mg | ORAL_TABLET | Freq: Every day | ORAL | Status: AC

## 2019-12-13 MED ORDER — APIXABAN 5 MG PO TABS: 5.0000 mg | ORAL_TABLET | Freq: Two times a day (BID) | ORAL | Status: AC

## 2019-12-13 MED ORDER — LEVALBUTEROL HCL 0.63 MG/3ML IN NEBU: 0.6300 mg | INHALATION_SOLUTION | Freq: Four times a day (QID) | RESPIRATORY_TRACT | 12 refills | Status: AC | PRN

## 2019-12-13 MED ORDER — SALINE SPRAY 0.65 % NA SOLN: 1.0000 | NASAL | 0 refills | Status: AC | PRN

## 2019-12-13 MED ORDER — ACETAMINOPHEN 160 MG/5ML PO SOLN: 650.0000 mg | Freq: Four times a day (QID) | ORAL | 0 refills | Status: AC | PRN

## 2019-12-13 MED ORDER — PRO-STAT SUGAR FREE PO LIQD: 30.0000 mL | Freq: Four times a day (QID) | ORAL | 0 refills | Status: AC

## 2019-12-13 MED ORDER — ASCORBIC ACID 500 MG PO TABS: 500.0000 mg | ORAL_TABLET | Freq: Two times a day (BID) | ORAL | Status: AC

## 2019-12-13 MED ORDER — PANTOPRAZOLE SODIUM 40 MG PO TBEC: 40.0000 mg | DELAYED_RELEASE_TABLET | Freq: Every day | ORAL | Status: AC

## 2019-12-13 MED ORDER — OXYCODONE HCL 5 MG PO TABS: 5.0000 mg | ORAL_TABLET | ORAL | 0 refills | Status: AC | PRN

## 2019-12-13 MED ORDER — ENSURE ENLIVE PO LIQD: 237.0000 mL | Freq: Three times a day (TID) | ORAL | 12 refills | Status: AC

## 2019-12-13 MED ORDER — ROSUVASTATIN CALCIUM 10 MG PO TABS: 10.0000 mg | ORAL_TABLET | Freq: Every day | ORAL | Status: AC

## 2019-12-13 MED ORDER — ASPIRIN 81 MG PO CHEW: 81.0000 mg | CHEWABLE_TABLET | Freq: Every day | ORAL | Status: AC

## 2019-12-13 MED ORDER — DARBEPOETIN ALFA 200 MCG/0.4ML IJ SOSY: 200.0000 ug | PREFILLED_SYRINGE | INTRAMUSCULAR | Status: AC

## 2019-12-13 MED ORDER — RENA-VITE PO TABS: 1.0000 | ORAL_TABLET | Freq: Every day | ORAL | 0 refills | Status: AC

## 2019-12-13 NOTE — Progress Notes (Signed)
[facility] Progress Note  Subjective: seen in room, groggy, no distress or c/o. reweighed this am at 86.3 kg (93kg inaccurate due to VAC suction machine not removed)  Vitals:   12/13/19 0600 12/13/19 0800 12/13/19 1118 12/13/19 1127  BP: (!) 106/57 (!) 89/65 (!) 87/56   Pulse: (!) 102 (!) 115 93   Resp: (!) 28 (!) 26 (!) 22   Temp:  98.8 F (37.1 C) 98.8 F (37.1 C) 98.1 F (36.7 C)  TempSrc:  Axillary  Oral  SpO2: 100% 100% 100%   Weight:      Height:        Exam:  a lert, nasal O2, no distress, nasal cortrak in place  no jvd   Chest clear R, dec'd L base   Cor reg , sternotomy wound w/ vac in place   Abd obese no abd wall edema   Ext no edema , some gangrenous toe changes    Dialysis: [name] MWF 4h 400/800 99.5kg 2K/3.5Ca TDC Heparin 3000 -Mircera 75 (last 3/22)  -Hectorol 1  Assessment/ Plan: 1. Redo CABG + AoVR 3/22, complicated by cardiogenic shock: Patient with history of coronary artery disease/NSTEMI. . On ASA, Plavix, statin.  Initially required CRRT, now on iHD 2.  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 ESRD:usual HD is MWF. Postop required CRRT due to hemodynamic instability.  Now back on MWF.   4 BP/ volume: prior dry wt was 99kg, peak wt 118kg, wt this am was rechecked and was 86.3 kg which should be his dc new dry wt (93 kg is not accurate) 5. Anemia ckd - Hb7s. Getting ESA darbe 100ug qFri. CTM, increased to 200 mcg 5/14.  Transfusion prn.  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 Nutrition: On dysphagia 3 diet with aspiration precautions based on results of swallow test.TF off.   8. Atrial fibrillation: With controlled ventricular response, on amio and Eliquis 9. Dispo - for LTACH today     Patrick [name] 12/13/2019, 12:32 PM   Recent Labs  Lab 12/10/19 0426 12/11/19 0500 12/11/19 1048 12/12/19 0500  K  --  4.1  --  3.9  BUN   --  67*  --  90*  CREATININE  --  6.35*  --  8.08*  CALCIUM  --  8.1*  --  7.7*  HGB   < >  --  8.0* 7.9*   < > = values in this interval not displayed.   Inpatient medications: . sodium chloride   Intravenous Once  . sodium chloride   Intravenous Once  . sodium chloride   Intravenous Once  . amiodarone  200 mg Per Tube BID  . apixaban  5 mg Per Tube BID  . vitamin C  500 mg Per Tube BID  . aspirin  81 mg Per Tube Daily  . Chlorhexidine Gluconate Cloth  6 each Topical Daily  . darbepoetin (ARANESP) injection - DIALYSIS  200 mcg Intravenous Q Fri-HD  . feeding supplement (ENSURE ENLIVE)  237 mL Per Tube TID BM  . feeding supplement (PRO-STAT SUGAR FREE 64)  30 mL Per Tube QID  . insulin aspart  0-9 Units Subcutaneous Q4H  . mouth rinse  15 mL Mouth Rinse BID  . midodrine  20 mg Per Tube TID WC  . multivitamin  1 tablet Oral QHS  . pantoprazole  40 mg Oral Daily  .  rosuvastatin  10 mg Per Tube q1800  . sodium chloride flush  10-40 mL Intracatheter Q12H   . sodium chloride Stopped (11/21/19 1749)  . sodium chloride Stopped (12/05/19 0144)   sodium chloride, acetaminophen (TYLENOL) oral liquid 160 mg/5 mL, dextrose, diphenhydrAMINE, fentaNYL (SUBLIMAZE) injection, Gerhardt's butt cream, heparin, heparin, heparin, levalbuterol, lip balm, metoprolol tartrate, ondansetron (ZOFRAN) IV, oxyCODONE, phenol, sodium chloride, sodium chloride flush

## 2019-12-13 NOTE — Progress Notes (Signed)
[facility] DAILY ICU PROGRESS NOTE                   [place].[address]            [place] [address]          [phone]   14 Days Post-Op Procedure(s) (LRB): STERNAL WOUND DEBRIDEMENT (N/A) APPLICATION OF WOUND VAC (N/A)  Total Length of Stay:  LOS: 50 days   Subjective:  No complaints  Objective: Vital signs in last 24 hours: Temp:  [97.7 F (36.5 C)-100.1 F (37.8 C)] 98.8 F (37.1 C) (05/18 0800) Pulse Rate:  [83-115] 115 (05/18 0800) Cardiac Rhythm: Sinus tachycardia (05/18 0800) Resp:  [15-37] 26 (05/18 0800) BP: (79-141)/(37-115) 89/65 (05/18 0800) SpO2:  [95 %-100 %] 100 % (05/18 0800) Weight:  [93.4 kg] 93.4 kg (05/17 1346)  Filed Weights   12/09/19 1125 12/10/19 0630 12/12/19 1346  Weight: 89.7 kg 89 kg 93.4 kg    Weight change:    Intake/Output from previous day: 05/17 0701 - 05/18 0700 In: 555 [P.O.:240; NG/GT:85] Out: 3000   Current Meds: Scheduled Meds: . sodium chloride   Intravenous Once  . sodium chloride   Intravenous Once  . sodium chloride   Intravenous Once  . amiodarone  200 mg Per Tube BID  . apixaban  5 mg Per Tube BID  . vitamin C  500 mg Per Tube BID  . aspirin  81 mg Per Tube Daily  . Chlorhexidine Gluconate Cloth  6 each Topical Daily  . darbepoetin (ARANESP) injection - DIALYSIS  200 mcg Intravenous Q Fri-HD  . feeding supplement (ENSURE ENLIVE)  237 mL Per Tube TID BM  . feeding supplement (PRO-STAT SUGAR FREE 64)  30 mL Per Tube QID  . insulin aspart  0-9 Units Subcutaneous Q4H  . mouth rinse  15 mL Mouth Rinse BID  . midodrine  20 mg Per Tube TID WC  . multivitamin  1 tablet Oral QHS  . pantoprazole  40 mg Oral Daily  . rosuvastatin  10 mg Per Tube q1800  . sodium chloride flush  10-40 mL Intracatheter Q12H   Continuous Infusions: . sodium chloride Stopped (11/21/19 1749)  . sodium chloride Stopped (12/05/19 0144)   PRN Meds:.sodium chloride, acetaminophen (TYLENOL) oral liquid 160 mg/5 mL, dextrose,  diphenhydrAMINE, fentaNYL (SUBLIMAZE) injection, Gerhardt's butt cream, heparin, heparin, heparin, levalbuterol, lip balm, metoprolol tartrate, ondansetron (ZOFRAN) IV, oxyCODONE, phenol, sodium chloride, sodium chloride flush  General appearance: alert, cooperative and no distress Heart: regular rate and rhythm Lungs: diminished breath sounds bibasilar Abdomen: + distention, hypoactive BS Wound: wound vac in place on sternum,   Lab Results: CBC: Recent Labs    12/11/19 1048 12/12/19 0500  WBC 6.9 6.9  HGB 8.0* 7.9*  HCT 25.5* 25.0*  PLT 78* 79*   BMET:  Recent Labs    12/11/19 0500 12/12/19 0500  NA 132* 135  K 4.1 3.9  CL 95* 98  CO2 24 23  GLUCOSE 259* 106*  BUN 67* 90*  CREATININE 6.35* 8.08*  CALCIUM 8.1* 7.7*    CMET: Lab Results  Component Value Date   WBC 6.9 12/12/2019   HGB 7.9 (L) 12/12/2019   HCT 25.0 (L) 12/12/2019   PLT 79 (L) 12/12/2019   GLUCOSE 106 (H) 12/12/2019   CHOL 122 02/15/2019   TRIG 85 02/15/2019   HDL 46 02/15/2019   LDLCALC 59 02/15/2019   ALT 76 (H) 12/12/2019   AST 65 (H) 12/12/2019  NA 135 12/12/2019   K 3.9 12/12/2019   CL 98 12/12/2019   CREATININE 8.08 (H) 12/12/2019   BUN 90 (H) 12/12/2019   CO2 23 12/12/2019   TSH 5.113 (H) 05/25/2016   INR 2.6 (H) 11/18/2019   HGBA1C 6.2 (H) 10/27/2019      PT/INR: No results for input(s): LABPROT, INR in the last 72 hours. Radiology: No results found.   Assessment/Plan: S/P Procedure(s) (LRB): STERNAL WOUND DEBRIDEMENT (N/A) APPLICATION OF WOUND VAC (N/A)  1. CV- PAF, in NSR, Bp is labile- continue Midodrine. Eliquis 2. Pulm- CXR reviewed, small bilateral pleural effusions, no acute issues, good O2 sats on RA 3. Renal-ESRD, dialysis on MWF 4. ID- patient has completed Linezolid for VRE infection 5. Anemia, chronic- hgb 7.9, transfused over the weekend 6. Malnutrition- dysphagia 2 diet, poor oral inake, continue supplements via tube 7. Deconditioning- patient requires  long term acute care, PT/OT as able 8. dispo- patient is medically stable, no acute changes, he is medically stable for discharge to LTAC today   Ellwood Handler, PA-C  12/13/2019 11:11 AM

## 2019-12-13 NOTE — Progress Notes (Signed)
[facility] DAILY ICU PROGRESS NOTE   [address]  [place] [address]  [phone]  14 Days Post-Op  Procedure(s) (LRB):  STERNAL WOUND DEBRIDEMENT (N/A)  APPLICATION OF WOUND VAC (N/A)  Total Length of Stay: LOS: 50 days  Subjective:  No complaints  Objective:  Vital signs in last 24 hours:  Temp: [97.7 F (36.5 C)-100.1 F (37.8 C)] 98.8 F (37.1 C) (05/18 0800)  Pulse Rate: [83-115] 115 (05/18 0800)  Cardiac Rhythm: Sinus tachycardia (05/18 0800)  Resp: [15-37] 26 (05/18 0800)  BP: (79-141)/(37-115) 89/65 (05/18 0800)  SpO2: [95 %-100 %] 100 % (05/18 0800)  Weight: [93.4 kg] 93.4 kg (05/17 1346)       Filed Weights   12/09/19 1125 12/10/19 0630 12/12/19 1346  Weight: 89.7 kg 89 kg 93.4 kg   Weight change:  Intake/Output from previous day:  05/17 0701 - 05/18 0700  In: 555 [P.O.:240; NG/GT:85]  Out: 3000  Current Meds:  Scheduled Meds:   sodium chloride  Intravenous Once   sodium chloride  Intravenous Once   sodium chloride  Intravenous Once   amiodarone 200 mg Per Tube BID   apixaban 5 mg Per Tube BID   vitamin C 500 mg Per Tube BID   aspirin 81 mg Per Tube Daily   Chlorhexidine Gluconate Cloth 6 each Topical Daily   darbepoetin (ARANESP) injection - DIALYSIS 200 mcg Intravenous Q Fri-HD   feeding supplement (ENSURE ENLIVE) 237 mL Per Tube TID BM   feeding supplement (PRO-STAT SUGAR FREE 64) 30 mL Per Tube QID   insulin aspart 0-9 Units Subcutaneous Q4H   mouth rinse 15 mL Mouth Rinse BID   midodrine 20 mg Per Tube TID WC   multivitamin 1 tablet Oral QHS   pantoprazole 40 mg Oral Daily   rosuvastatin 10 mg Per Tube q1800   sodium chloride flush 10-40 mL Intracatheter Q12H   Continuous Infusions:   sodium chloride Stopped (11/21/19 1749)   sodium chloride Stopped (12/05/19 0144)   PRN Meds:.sodium chloride, acetaminophen (TYLENOL) oral liquid 160 mg/5 mL, dextrose, diphenhydrAMINE, fentaNYL (SUBLIMAZE) injection, Gerhardt's butt cream, heparin,  heparin, heparin, levalbuterol, lip balm, metoprolol tartrate, ondansetron (ZOFRAN) IV, oxyCODONE, phenol, sodium chloride, sodium chloride flush  General appearance: alert, cooperative and no distress  Heart: regular rate and rhythm  Lungs: diminished breath sounds bibasilar  Abdomen: + distention, hypoactive BS  Wound: wound vac in place on sternum,  Lab Results:  CBC:  Recent Labs (last 2 labs)                                    BMET:  Recent Labs (last 2 labs)                                                        CMET:  Recent Labs  PT/INR:  Recent Labs (last 2 labs)     Radiology: No results found.  Assessment/Plan:  S/P Procedure(s) (LRB):  STERNAL WOUND DEBRIDEMENT (N/A)  APPLICATION OF WOUND VAC (N/A)  1. CV- PAF, in NSR, Bp is labile- continue Midodrine. Eliquis  2. Pulm- CXR reviewed, small bilateral pleural effusions, no acute issues, good O2 sats on RA  3. Renal-ESRD, dialysis on MWF 4. ID- patient has completed Linezolid for VRE infection  5. Anemia, chronic- hgb 7.9, transfused over the weekend  6. Malnutrition- dysphagia 2 diet, poor oral inake, continue supplements via tube  7. Deconditioning- patient requires long term acute care, PT/OT as able  8. dispo- patient is medically stable, no acute changes, he is medically stable for discharge to LTAC today  Ellwood Handler, PA-C  12/13/2019 11:11 AM

## 2019-12-13 NOTE — Progress Notes (Signed)
PT Cancellation Note  Patient Details Name: REMBERT BROWE MRN: [id number] DOB: 26-Feb-1956   Cancelled Treatment:    Reason Eval/Treat Not Completed: Other (comment). Patient d/c to LTACH at 12p. Will defer treatment.  Mabeline Caras, PT, DPT Acute Rehabilitation Services  Pager [phone] Office [place] 12/13/2019, 9:59 AM

## 2019-12-13 NOTE — Progress Notes (Signed)
Patient report call to [facility] given [place] @ 1020.  All questions answered and given unit number for any additional questions.   Kathleene Hazel RN

## 2019-12-14 LAB — BPAM RBC
Blood Product Expiration Date: 202106092359
Blood Product Expiration Date: 202106112359
Blood Product Expiration Date: 202106152359
Blood Product Expiration Date: 202106152359
ISSUE DATE / TIME: 202105151840
ISSUE DATE / TIME: 202105152139
Unit Type and Rh: 9500
Unit Type and Rh: 9500
Unit Type and Rh: 9500
Unit Type and Rh: 9500

## 2019-12-14 LAB — TYPE AND SCREEN
ABO/RH(D): O POS
Antibody Screen: POSITIVE
DAT, IgG: NEGATIVE
Unit division: 0
Unit division: 0
Unit division: 0
Unit division: 0

## 2019-12-14 LAB — GLUCOSE, CAPILLARY: Glucose-Capillary: >600 mg/dL (ref 70–99)

## 2019-12-19 ENCOUNTER — Ambulatory Visit: Payer: Medicare HMO | Admitting: Cardiothoracic Surgery

## 2019-12-19 DIAGNOSIS — J9621 Acute and chronic respiratory failure with hypoxia: Secondary | ICD-10-CM

## 2019-12-19 DIAGNOSIS — I482 Chronic atrial fibrillation, unspecified: Secondary | ICD-10-CM

## 2019-12-19 DIAGNOSIS — I509 Heart failure, unspecified: Secondary | ICD-10-CM

## 2019-12-19 DIAGNOSIS — N186 End stage renal disease: Secondary | ICD-10-CM

## 2019-12-20 DIAGNOSIS — J9621 Acute and chronic respiratory failure with hypoxia: Secondary | ICD-10-CM

## 2019-12-20 DIAGNOSIS — N186 End stage renal disease: Secondary | ICD-10-CM

## 2019-12-20 DIAGNOSIS — I509 Heart failure, unspecified: Secondary | ICD-10-CM

## 2019-12-20 DIAGNOSIS — I482 Chronic atrial fibrillation, unspecified: Secondary | ICD-10-CM

## 2019-12-21 DIAGNOSIS — J9621 Acute and chronic respiratory failure with hypoxia: Secondary | ICD-10-CM

## 2019-12-21 DIAGNOSIS — I509 Heart failure, unspecified: Secondary | ICD-10-CM

## 2019-12-21 DIAGNOSIS — N186 End stage renal disease: Secondary | ICD-10-CM

## 2019-12-21 DIAGNOSIS — I482 Chronic atrial fibrillation, unspecified: Secondary | ICD-10-CM

## 2019-12-22 DIAGNOSIS — I509 Heart failure, unspecified: Secondary | ICD-10-CM

## 2019-12-22 DIAGNOSIS — N186 End stage renal disease: Secondary | ICD-10-CM

## 2019-12-22 DIAGNOSIS — J9621 Acute and chronic respiratory failure with hypoxia: Secondary | ICD-10-CM

## 2019-12-22 DIAGNOSIS — I482 Chronic atrial fibrillation, unspecified: Secondary | ICD-10-CM

## 2019-12-23 DIAGNOSIS — J9621 Acute and chronic respiratory failure with hypoxia: Secondary | ICD-10-CM

## 2019-12-23 DIAGNOSIS — N186 End stage renal disease: Secondary | ICD-10-CM

## 2019-12-23 DIAGNOSIS — I482 Chronic atrial fibrillation, unspecified: Secondary | ICD-10-CM

## 2019-12-23 DIAGNOSIS — I509 Heart failure, unspecified: Secondary | ICD-10-CM

## 2019-12-24 DIAGNOSIS — N186 End stage renal disease: Secondary | ICD-10-CM

## 2019-12-24 DIAGNOSIS — J9621 Acute and chronic respiratory failure with hypoxia: Secondary | ICD-10-CM

## 2019-12-24 DIAGNOSIS — I482 Chronic atrial fibrillation, unspecified: Secondary | ICD-10-CM

## 2019-12-24 DIAGNOSIS — I509 Heart failure, unspecified: Secondary | ICD-10-CM

## 2019-12-25 DIAGNOSIS — J9621 Acute and chronic respiratory failure with hypoxia: Secondary | ICD-10-CM

## 2019-12-25 DIAGNOSIS — N186 End stage renal disease: Secondary | ICD-10-CM

## 2019-12-25 DIAGNOSIS — I482 Chronic atrial fibrillation, unspecified: Secondary | ICD-10-CM

## 2019-12-25 DIAGNOSIS — I509 Heart failure, unspecified: Secondary | ICD-10-CM

## 2019-12-27 ENCOUNTER — Ambulatory Visit: Payer: Medicare HMO | Admitting: Physician Assistant

## 2020-01-26 DEATH — deceased
